# Patient Record
Sex: Male | Born: 1950
Health system: Southern US, Community
[De-identification: ages and names within clinical notes are randomized; demographics above are authoritative.]

## PROBLEM LIST (undated history)

## (undated) DIAGNOSIS — G8929 Other chronic pain: Secondary | ICD-10-CM

## (undated) DIAGNOSIS — F32A Depression, unspecified: Secondary | ICD-10-CM

## (undated) DIAGNOSIS — N62 Hypertrophy of breast: Secondary | ICD-10-CM

## (undated) DIAGNOSIS — Z9981 Dependence on supplemental oxygen: Secondary | ICD-10-CM

## (undated) DIAGNOSIS — M069 Rheumatoid arthritis, unspecified: Secondary | ICD-10-CM

## (undated) DIAGNOSIS — Z9889 Other specified postprocedural states: Secondary | ICD-10-CM

## (undated) DIAGNOSIS — G459 Transient cerebral ischemic attack, unspecified: Secondary | ICD-10-CM

## (undated) DIAGNOSIS — I82409 Acute embolism and thrombosis of unspecified deep veins of unspecified lower extremity: Secondary | ICD-10-CM

## (undated) DIAGNOSIS — K209 Esophagitis, unspecified without bleeding: Secondary | ICD-10-CM

## (undated) DIAGNOSIS — I6529 Occlusion and stenosis of unspecified carotid artery: Secondary | ICD-10-CM

## (undated) DIAGNOSIS — H269 Unspecified cataract: Secondary | ICD-10-CM

## (undated) DIAGNOSIS — T7840XA Allergy, unspecified, initial encounter: Secondary | ICD-10-CM

## (undated) DIAGNOSIS — A5149 Other secondary syphilitic conditions: Secondary | ICD-10-CM

## (undated) DIAGNOSIS — C9110 Chronic lymphocytic leukemia of B-cell type not having achieved remission: Secondary | ICD-10-CM

## (undated) DIAGNOSIS — J189 Pneumonia, unspecified organism: Secondary | ICD-10-CM

## (undated) DIAGNOSIS — F329 Major depressive disorder, single episode, unspecified: Secondary | ICD-10-CM

## (undated) DIAGNOSIS — B20 Human immunodeficiency virus [HIV] disease: Secondary | ICD-10-CM

## (undated) DIAGNOSIS — M549 Dorsalgia, unspecified: Secondary | ICD-10-CM

## (undated) DIAGNOSIS — K219 Gastro-esophageal reflux disease without esophagitis: Secondary | ICD-10-CM

## (undated) DIAGNOSIS — D689 Coagulation defect, unspecified: Secondary | ICD-10-CM

## (undated) DIAGNOSIS — G709 Myoneural disorder, unspecified: Secondary | ICD-10-CM

## (undated) DIAGNOSIS — I639 Cerebral infarction, unspecified: Secondary | ICD-10-CM

## (undated) DIAGNOSIS — M179 Osteoarthritis of knee, unspecified: Secondary | ICD-10-CM

## (undated) DIAGNOSIS — J302 Other seasonal allergic rhinitis: Secondary | ICD-10-CM

## (undated) DIAGNOSIS — Z8719 Personal history of other diseases of the digestive system: Secondary | ICD-10-CM

## (undated) DIAGNOSIS — N529 Male erectile dysfunction, unspecified: Secondary | ICD-10-CM

## (undated) DIAGNOSIS — R112 Nausea with vomiting, unspecified: Secondary | ICD-10-CM

## (undated) DIAGNOSIS — I219 Acute myocardial infarction, unspecified: Secondary | ICD-10-CM

## (undated) DIAGNOSIS — K8020 Calculus of gallbladder without cholecystitis without obstruction: Secondary | ICD-10-CM

## (undated) DIAGNOSIS — I10 Essential (primary) hypertension: Secondary | ICD-10-CM

## (undated) DIAGNOSIS — M1710 Unilateral primary osteoarthritis, unspecified knee: Secondary | ICD-10-CM

## (undated) DIAGNOSIS — K649 Unspecified hemorrhoids: Secondary | ICD-10-CM

## (undated) DIAGNOSIS — M797 Fibromyalgia: Secondary | ICD-10-CM

## (undated) DIAGNOSIS — Z21 Asymptomatic human immunodeficiency virus [HIV] infection status: Secondary | ICD-10-CM

## (undated) DIAGNOSIS — M810 Age-related osteoporosis without current pathological fracture: Secondary | ICD-10-CM

## (undated) DIAGNOSIS — B159 Hepatitis A without hepatic coma: Secondary | ICD-10-CM

## (undated) DIAGNOSIS — I2510 Atherosclerotic heart disease of native coronary artery without angina pectoris: Secondary | ICD-10-CM

## (undated) DIAGNOSIS — K5790 Diverticulosis of intestine, part unspecified, without perforation or abscess without bleeding: Secondary | ICD-10-CM

## (undated) DIAGNOSIS — M109 Gout, unspecified: Secondary | ICD-10-CM

## (undated) DIAGNOSIS — F419 Anxiety disorder, unspecified: Secondary | ICD-10-CM

## (undated) DIAGNOSIS — D126 Benign neoplasm of colon, unspecified: Secondary | ICD-10-CM

## (undated) HISTORY — DX: Acute embolism and thrombosis of unspecified deep veins of unspecified lower extremity: I82.409

## (undated) HISTORY — PX: HIATAL HERNIA REPAIR: SHX195

## (undated) HISTORY — DX: Benign neoplasm of colon, unspecified: D12.6

## (undated) HISTORY — DX: Unspecified cataract: H26.9

## (undated) HISTORY — DX: Other specified postprocedural states: Z98.890

## (undated) HISTORY — DX: Gout, unspecified: M10.9

## (undated) HISTORY — PX: TONSILLECTOMY: SUR1361

## (undated) HISTORY — DX: Male erectile dysfunction, unspecified: N52.9

## (undated) HISTORY — PX: CHOLECYSTECTOMY: SHX55

## (undated) HISTORY — PX: OTHER SURGICAL HISTORY: SHX169

## (undated) HISTORY — DX: Coagulation defect, unspecified: D68.9

## (undated) HISTORY — DX: Allergy, unspecified, initial encounter: T78.40XA

## (undated) HISTORY — DX: Diverticulosis of intestine, part unspecified, without perforation or abscess without bleeding: K57.90

## (undated) HISTORY — DX: Unilateral primary osteoarthritis, unspecified knee: M17.10

## (undated) HISTORY — DX: Personal history of other diseases of the digestive system: Z87.19

## (undated) HISTORY — DX: Pneumonia, unspecified organism: J18.9

## (undated) HISTORY — DX: Hepatitis a without hepatic coma: B15.9

## (undated) HISTORY — DX: Anxiety disorder, unspecified: F41.9

## (undated) HISTORY — DX: Transient cerebral ischemic attack, unspecified: G45.9

## (undated) HISTORY — DX: Esophagitis, unspecified: K20.9

## (undated) HISTORY — PX: ROTATOR CUFF REPAIR: SHX139

## (undated) HISTORY — DX: Esophagitis, unspecified without bleeding: K20.90

## (undated) HISTORY — DX: Atherosclerotic heart disease of native coronary artery without angina pectoris: I25.10

## (undated) HISTORY — DX: Rheumatoid arthritis, unspecified: M06.9

## (undated) HISTORY — DX: Hypertrophy of breast: N62

## (undated) HISTORY — DX: Calculus of gallbladder without cholecystitis without obstruction: K80.20

## (undated) HISTORY — PX: SHOULDER SURGERY: SHX246

## (undated) HISTORY — DX: Age-related osteoporosis without current pathological fracture: M81.0

## (undated) HISTORY — DX: Other secondary syphilitic conditions: A51.49

## (undated) HISTORY — PX: BACK SURGERY: SHX140

## (undated) HISTORY — DX: Unspecified hemorrhoids: K64.9

## (undated) HISTORY — PX: INGUINAL HERNIA REPAIR: SUR1180

## (undated) HISTORY — DX: Asymptomatic human immunodeficiency virus (hiv) infection status: Z21

## (undated) HISTORY — DX: Occlusion and stenosis of unspecified carotid artery: I65.29

## (undated) HISTORY — DX: Human immunodeficiency virus (HIV) disease: B20

## (undated) HISTORY — PX: REPLACEMENT TOTAL KNEE: SUR1224

## (undated) HISTORY — DX: Dorsalgia, unspecified: M54.9

## (undated) HISTORY — DX: Acute myocardial infarction, unspecified: I21.9

## (undated) HISTORY — DX: Chronic lymphocytic leukemia of B-cell type not having achieved remission: C91.10

## (undated) HISTORY — PX: MANDIBLE SURGERY: SHX707

## (undated) HISTORY — DX: Fibromyalgia: M79.7

## (undated) HISTORY — DX: Osteoarthritis of knee, unspecified: M17.9

## (undated) HISTORY — DX: Other chronic pain: G89.29

## (undated) HISTORY — PX: UMBILICAL HERNIA REPAIR: SHX196

---
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
## 1995-05-13 DIAGNOSIS — G459 Transient cerebral ischemic attack, unspecified: Secondary | ICD-10-CM

## 1995-05-13 HISTORY — DX: Transient cerebral ischemic attack, unspecified: G45.9

## 1997-05-12 HISTORY — PX: JOINT REPLACEMENT: SHX530

## 2006-05-12 DIAGNOSIS — Z8719 Personal history of other diseases of the digestive system: Secondary | ICD-10-CM

## 2006-05-12 HISTORY — DX: Personal history of other diseases of the digestive system: Z87.19

## 2008-05-12 DIAGNOSIS — I219 Acute myocardial infarction, unspecified: Secondary | ICD-10-CM

## 2008-05-12 DIAGNOSIS — I2510 Atherosclerotic heart disease of native coronary artery without angina pectoris: Secondary | ICD-10-CM

## 2008-05-12 HISTORY — PX: SPINE SURGERY: SHX786

## 2008-05-12 HISTORY — DX: Atherosclerotic heart disease of native coronary artery without angina pectoris: I25.10

## 2008-05-12 HISTORY — PX: OTHER SURGICAL HISTORY: SHX169

## 2008-05-12 HISTORY — PX: CORONARY ARTERY BYPASS GRAFT: SHX141

## 2008-05-12 HISTORY — DX: Acute myocardial infarction, unspecified: I21.9

## 2008-06-22 DIAGNOSIS — A6000 Herpesviral infection of urogenital system, unspecified: Secondary | ICD-10-CM | POA: Insufficient documentation

## 2008-06-22 DIAGNOSIS — C9110 Chronic lymphocytic leukemia of B-cell type not having achieved remission: Secondary | ICD-10-CM | POA: Insufficient documentation

## 2008-06-26 DIAGNOSIS — E7800 Pure hypercholesterolemia, unspecified: Secondary | ICD-10-CM | POA: Insufficient documentation

## 2008-06-30 DIAGNOSIS — I82409 Acute embolism and thrombosis of unspecified deep veins of unspecified lower extremity: Secondary | ICD-10-CM | POA: Insufficient documentation

## 2008-08-21 DIAGNOSIS — R1031 Right lower quadrant pain: Secondary | ICD-10-CM | POA: Insufficient documentation

## 2008-09-08 DIAGNOSIS — K5732 Diverticulitis of large intestine without perforation or abscess without bleeding: Secondary | ICD-10-CM | POA: Insufficient documentation

## 2008-09-15 ENCOUNTER — Ambulatory Visit: Payer: Self-pay | Admitting: Cardiovascular Disease

## 2008-09-15 ENCOUNTER — Inpatient Hospital Stay (HOSPITAL_COMMUNITY): Admission: EM | Admit: 2008-09-15 | Discharge: 2008-09-18 | Payer: Self-pay | Admitting: Cardiovascular Disease

## 2008-09-26 DIAGNOSIS — I2510 Atherosclerotic heart disease of native coronary artery without angina pectoris: Secondary | ICD-10-CM | POA: Insufficient documentation

## 2008-09-28 ENCOUNTER — Ambulatory Visit: Payer: Self-pay | Admitting: Cardiology

## 2008-09-28 ENCOUNTER — Ambulatory Visit: Payer: Self-pay | Admitting: Cardiothoracic Surgery

## 2008-09-28 ENCOUNTER — Encounter: Payer: Self-pay | Admitting: Cardiology

## 2008-09-28 ENCOUNTER — Inpatient Hospital Stay (HOSPITAL_COMMUNITY): Admission: EM | Admit: 2008-09-28 | Discharge: 2008-10-09 | Payer: Self-pay | Admitting: Cardiology

## 2008-10-03 ENCOUNTER — Encounter: Payer: Self-pay | Admitting: Cardiology

## 2008-10-03 ENCOUNTER — Encounter: Payer: Self-pay | Admitting: Cardiothoracic Surgery

## 2008-10-05 ENCOUNTER — Encounter: Payer: Self-pay | Admitting: Cardiology

## 2008-10-16 ENCOUNTER — Ambulatory Visit: Payer: Self-pay | Admitting: Cardiothoracic Surgery

## 2008-10-27 ENCOUNTER — Encounter: Admission: RE | Admit: 2008-10-27 | Discharge: 2008-10-27 | Payer: Self-pay | Admitting: Cardiothoracic Surgery

## 2008-10-27 ENCOUNTER — Ambulatory Visit: Payer: Self-pay | Admitting: Cardiothoracic Surgery

## 2008-10-27 ENCOUNTER — Encounter: Payer: Self-pay | Admitting: Cardiology

## 2008-11-08 ENCOUNTER — Ambulatory Visit: Payer: Self-pay | Admitting: Cardiology

## 2008-11-10 ENCOUNTER — Ambulatory Visit: Payer: Self-pay | Admitting: Cardiothoracic Surgery

## 2008-12-26 ENCOUNTER — Telehealth: Payer: Self-pay | Admitting: Physician Assistant

## 2008-12-26 ENCOUNTER — Encounter: Payer: Self-pay | Admitting: Cardiology

## 2008-12-27 ENCOUNTER — Encounter: Payer: Self-pay | Admitting: Cardiology

## 2009-01-17 ENCOUNTER — Encounter: Payer: Self-pay | Admitting: Cardiology

## 2009-01-29 ENCOUNTER — Telehealth (INDEPENDENT_AMBULATORY_CARE_PROVIDER_SITE_OTHER): Payer: Self-pay | Admitting: *Deleted

## 2009-01-30 DIAGNOSIS — R1310 Dysphagia, unspecified: Secondary | ICD-10-CM | POA: Insufficient documentation

## 2009-01-30 DIAGNOSIS — R42 Dizziness and giddiness: Secondary | ICD-10-CM | POA: Insufficient documentation

## 2009-02-11 ENCOUNTER — Encounter: Payer: Self-pay | Admitting: Cardiology

## 2009-02-12 ENCOUNTER — Encounter: Payer: Self-pay | Admitting: Cardiology

## 2009-02-14 DIAGNOSIS — I25810 Atherosclerosis of coronary artery bypass graft(s) without angina pectoris: Secondary | ICD-10-CM | POA: Insufficient documentation

## 2009-02-20 ENCOUNTER — Ambulatory Visit: Payer: Self-pay | Admitting: Cardiology

## 2009-02-22 ENCOUNTER — Encounter: Payer: Self-pay | Admitting: Cardiology

## 2009-03-06 ENCOUNTER — Encounter: Payer: Self-pay | Admitting: Cardiology

## 2009-03-20 ENCOUNTER — Encounter: Payer: Self-pay | Admitting: Cardiology

## 2009-05-01 ENCOUNTER — Telehealth (INDEPENDENT_AMBULATORY_CARE_PROVIDER_SITE_OTHER): Payer: Self-pay | Admitting: *Deleted

## 2009-05-03 ENCOUNTER — Encounter: Payer: Self-pay | Admitting: Cardiology

## 2009-05-09 ENCOUNTER — Encounter: Payer: Self-pay | Admitting: Cardiology

## 2009-05-17 ENCOUNTER — Encounter: Payer: Self-pay | Admitting: Cardiology

## 2009-05-17 ENCOUNTER — Ambulatory Visit: Payer: Self-pay | Admitting: Cardiology

## 2009-05-17 ENCOUNTER — Encounter: Payer: Self-pay | Admitting: Physician Assistant

## 2009-05-18 ENCOUNTER — Encounter: Payer: Self-pay | Admitting: Cardiology

## 2009-05-19 ENCOUNTER — Encounter: Payer: Self-pay | Admitting: Cardiology

## 2009-05-21 ENCOUNTER — Telehealth (INDEPENDENT_AMBULATORY_CARE_PROVIDER_SITE_OTHER): Payer: Self-pay | Admitting: *Deleted

## 2009-05-24 ENCOUNTER — Telehealth (INDEPENDENT_AMBULATORY_CARE_PROVIDER_SITE_OTHER): Payer: Self-pay | Admitting: *Deleted

## 2009-06-04 ENCOUNTER — Encounter: Payer: Self-pay | Admitting: Cardiology

## 2009-06-09 ENCOUNTER — Encounter: Payer: Self-pay | Admitting: Cardiology

## 2009-06-20 ENCOUNTER — Encounter: Payer: Self-pay | Admitting: Physician Assistant

## 2009-06-22 ENCOUNTER — Encounter (INDEPENDENT_AMBULATORY_CARE_PROVIDER_SITE_OTHER): Payer: Self-pay | Admitting: *Deleted

## 2009-07-04 ENCOUNTER — Encounter: Payer: Self-pay | Admitting: Cardiology

## 2009-07-12 ENCOUNTER — Ambulatory Visit: Payer: Self-pay | Admitting: Cardiology

## 2009-08-29 ENCOUNTER — Encounter: Payer: Self-pay | Admitting: Cardiology

## 2009-11-27 ENCOUNTER — Encounter: Payer: Self-pay | Admitting: Cardiology

## 2009-11-27 ENCOUNTER — Encounter: Payer: Self-pay | Admitting: Internal Medicine

## 2010-01-08 ENCOUNTER — Telehealth (INDEPENDENT_AMBULATORY_CARE_PROVIDER_SITE_OTHER): Payer: Self-pay | Admitting: *Deleted

## 2010-01-15 ENCOUNTER — Telehealth (INDEPENDENT_AMBULATORY_CARE_PROVIDER_SITE_OTHER): Payer: Self-pay | Admitting: *Deleted

## 2010-01-28 ENCOUNTER — Encounter: Payer: Self-pay | Admitting: Cardiology

## 2010-01-29 ENCOUNTER — Ambulatory Visit: Payer: Self-pay | Admitting: Cardiology

## 2010-04-09 ENCOUNTER — Ambulatory Visit: Payer: Self-pay | Admitting: Internal Medicine

## 2010-04-29 ENCOUNTER — Encounter (HOSPITAL_COMMUNITY)
Admission: RE | Admit: 2010-04-29 | Discharge: 2010-05-29 | Payer: Self-pay | Source: Home / Self Care | Attending: Internal Medicine | Admitting: Internal Medicine

## 2010-05-20 ENCOUNTER — Ambulatory Visit (HOSPITAL_COMMUNITY)
Admission: RE | Admit: 2010-05-20 | Discharge: 2010-05-20 | Payer: Self-pay | Source: Home / Self Care | Attending: Internal Medicine | Admitting: Internal Medicine

## 2010-06-11 NOTE — Procedures (Signed)
Summary: Cardionet-Final Summary  Cardionet-Final Summary   Imported By: Cyril Loosen, RN, BSN 06/22/2009 72:53:66  _____________________________________________________________________  External Attachment:    Type:   Image     Comment:   External Document  Appended Document: Cardionet-Final Summary Pt notified of results by letter.

## 2010-06-11 NOTE — Consult Note (Signed)
Summary: [facility]OLOGY CONSULT [facility]  CARDIOLOGY CONSULT [facility]   Imported By: Zachary George 02/20/2009 12:48:27  _____________________________________________________________________  External Attachment:    Type:   Image     Comment:   External Document

## 2010-06-11 NOTE — Letter (Signed)
Summary: Engineer, materials at [facility]  [address]   [place], [place] [address]   Phone: [phone]  Fax: [phone]        June 22, 2009 MRN: [id number]    [name] [address] [place], [place]  [address]    Dear Mr. [name],  Your test ordered by Selena Batten has been reviewed by your physician (or physician assistant) and was found to be normal or stable. Your physician (or physician assistant) felt no changes were needed at this time.  ____ Echocardiogram  ____ Cardiac Stress Test  ____ Lab Work  ____ Peripheral vascular study of arms, legs or neck  ____ CT scan or X-ray  ____ Lung or Breathing test  __X__ Other: Heart Monitor   Thank you.   Cyril Loosen, RN, BSN    Duane Boston, M.D., F.A.C.C. Thressa Sheller, M.D., F.A.C.C. Oneal Grout, M.D., F.A.C.C. Cheree Ditto, M.D., F.A.C.C. Daiva Nakayama, M.D., F.A.C.C. Kenney Houseman, M.D., F.A.C.C. Jeanne Ivan, PA-C

## 2010-06-11 NOTE — Progress Notes (Signed)
Summary: ?MISSED APPT  Phone Note Outgoing Call Call back at [facility] Phone [phone]   Call placed by: Carlye Grippe,  January 15, 2010 2:16 PM Call placed to: Patient Summary of Call: left message on machine to call office in r/e to missed appointment today.  Initial call taken by: Carlye Grippe,  January 15, 2010 2:17 PM

## 2010-06-11 NOTE — Miscellaneous (Signed)
Summary: RX Crestor  Clinical Lists Changes  Medications: Rx of CRESTOR 10 MG TABS (ROSUVASTATIN CALCIUM) Take 1 tab by mouth at bedtime;  #30 x 1;  Signed;  Entered by: Cyril Loosen, RN, BSN;  Authorized by: Rollene Rotunda, MD, [facility];  Method used: Electronically to [facility] # [phone]*, [address] [place], [place]  [address], Ph: [phone] or [phone], Fax: [phone]    Prescriptions: CRESTOR 10 MG TABS (ROSUVASTATIN CALCIUM) Take 1 tab by mouth at bedtime  #30 x 1   Entered by:   Cyril Loosen, RN, BSN   Authorized by:   Rollene Rotunda, MD, [facility]   Signed by:   Cyril Loosen, RN, BSN on 12/26/2008   Method used:   Electronically to        [facility] # [phone]* (retail)       [address]       [place], [place]  [address]       Ph: [phone] or [phone]       Fax: [phone]   RxID:   [id number]

## 2010-06-11 NOTE — Letter (Signed)
Summary: [facility] DR.Tadd Holtmeyer [facility]  [facility] DR[facility]Barnett Elzey [facility]   Imported By: Zachary George 02/20/2009 12:37:35  _____________________________________________________________________  External Attachment:    Type:   Image     Comment:   External Document

## 2010-06-11 NOTE — Op Note (Signed)
Summary: ESOPHAGOGASTRODUODENOSCOPY   ESOPHAGOGASTRODUODENOSCOPY   Imported By: Zachary George 02/20/2009 12:38:06  _____________________________________________________________________  External Attachment:    Type:   Image     Comment:   External Document

## 2010-06-11 NOTE — Letter (Signed)
Summary: External Correspondence/ NEUROSURGERY VISIT DR. BAGLEY  External Correspondence/ NEUROSURGERY VISIT DR. BAGLEY   Imported By: Dorise Hiss 09/10/2009 08:51:06  _____________________________________________________________________  External Attachment:    Type:   Image     Comment:   External Document  Appended Document: External Correspondence/ NEUROSURGERY VISIT DR. BAGLEY Patient of Dr. Antoine Poche.

## 2010-06-11 NOTE — Progress Notes (Signed)
Summary: TCT OFFICE VISIT  TCT OFFICE VISIT   Imported By: Zachary George 02/20/2009 12:52:09  _____________________________________________________________________  External Attachment:    Type:   Image     Comment:   External Document

## 2010-06-11 NOTE — Progress Notes (Signed)
Summary: Med Question-  Phone Note Call from Patient Call back at [facility] [address] Phone [phone]   Summary of Call: Pt called stating asking if he could use Cialis. He states he no longer take Isosorbide. He states this was only for 1 month following surgery.  Initial call taken by: Cyril Loosen, RN, BSN,  December 26, 2008 8:35 AM  Follow-up for Phone Call        OK to proceed, but no nitrates (IMDUR, as needed NTG) in 24 hour period. Follow-up by: Nelida Meuse, PA-C,  December 26, 2008 12:17 PM  Additional Follow-up for Phone Call Additional follow up Details #1::        Left message to call back  Cyril Loosen, RN, BSN  December 26, 2008 3:17 PM Pt notified. Pt verbalized understanding.   Additional Follow-up by: Cyril Loosen, RN, BSN,  December 26, 2008 4:17 PM

## 2010-06-11 NOTE — Letter (Signed)
Summary: External Correspondence/ NEUROSURGERY VISIT [facility]  External Correspondence/ NEUROSURGERY VISIT [facility]   Imported By: Dorise Hiss 05/21/2009 11:30:50  _____________________________________________________________________  External Attachment:    Type:   Image     Comment:   External Document

## 2010-06-11 NOTE — Assessment & Plan Note (Signed)
Summary: 3 MONTH FU RECV REMINDER VS   Visit Type:  Follow-up Primary Provider:  Sell,Jarrett  CC:  CAD/CABG.  History of Present Illness: The patient presents for followup of his known coronary disease. Since I last saw him he has had no cardiovascular complaints. He was hospitalized with a spontaneous left hip hematoma. He has required an EGD with esophageal dilatation. All of this interrupted his cardiac rehabilitation. However, he is now back in cardiac rehabilitation exercising 3 times per week. With this he has had none of the chest discomfort that was his previous symptoms. He has had no arm or neck discomfort. He has had no shortness of breath and denies any resting symptoms such as PND or orthopnea. He has had no palpitations, presyncope or syncope. He is bothered by lumbar back pain and is being evaluated for surgical repair of this.    Of note his Crestor was discontinued secondary to hyperbilirubinemia. However, this is felt to be related to his HIV drugs. His counts remained elevated off of the Crestor.  Preventive Screening-Counseling & Management  Alcohol-Tobacco     Smoking Status: never  Current Medications (verified): 1)  Metformin Hcl 500 Mg Tabs (Metformin Hcl) .... Take 1 Tablet By Mouth Once A Day 2)  Hydrocortisone 20 Mg Tabs (Hydrocortisone) .... One Tab Every Morning and 1/2 Tab Every Evening 3)  Combivir 150-300 Mg Tabs (Lamivudine-Zidovudine) .... Take 1 Tablet By Mouth Two Times A Day 4)  Lyrica 100 Mg Caps (Pregabalin) .... 2 Tabs Every Morning and 1 Tab At At Bedtime 5)  Reyataz 150 Mg Caps (Atazanavir Sulfate) .... Take 1 Tablet By Mouth Two Times A Day 6)  Norvir 100 Mg Tabs (Ritonavir) .... Take 1 Tablet By Mouth Once A Day 7)  Warfarin Sodium 10 Mg Tabs (Warfarin Sodium) .... Take As Directed Per Coumadin Clinic With Dr. Jake Samples 8)  Aspirin 81 Mg Tbec (Aspirin) .... Take One Tablet By Mouth Daily  9)  Valtrex 500 Mg Tabs (Valacyclovir Hcl) .... Take 1 Tablet By Mouth Once A Day 10)  Vitamin D 1000 Unit Tabs (Cholecalciferol) .... 2 Tabs Every Morning 11)  Multivitamins   Tabs (Multiple Vitamin) .... Take 1 Tablet By Mouth Once A Day 12)  Calcium Carbonate 600 Mg Tabs (Calcium Carbonate) .... Take 1 Tablet By Mouth Once A Day 13)  Cialis 20 Mg Tabs (Tadalafil) .... As Needed  Allergies (verified): 1)  ! Pcn 2)  ! Phenergan 3)  ! Morphine  Past History:  Past Medical History: HYPERLIPIDEMIA-MIXED (ICD-272.4) CAD, NATIVE VESSEL (ICD-414.01) (Oct 02, 2008.  This demonstrated left main 50-60% stenosis.  The LAD had 80% stenosis beyond the takeoff of the first diagonal branch.  The proximal stent was patent.  The circumflex had 80- 90% ostial stenosis.  Ramus intermediate had 60-70% stenosis.  It was a small to moderate size vessel.  Small marginal had 80% stenosis.  The right coronary artery had an ulcerated 80% stenosis in the mid segment.) The EF was 40-45%.   DVT  Diabetes.  Past Surgical History: Cholecystectomy CABG (Dr. [name] with a LIMA to the LAD, left radial graft to the circumflex and marginal, and right radial graft to the distal right coronary artery. )  Review of Systems       As stated in the HPI and negative for all other systems.   Vital Signs:  Patient profile:   60 year old male Height:      70 inches Weight:  179.50 pounds BMI:     25.85 Pulse rate:   80 / minute BP sitting:   87 / 43  (left arm) Cuff size:   regular  Vitals Entered By: Hoover Brunette, LPN (February 20, 2009 1:35 PM)  Nutrition Counseling: Patient's BMI is greater than 25 and therefore counseled on weight management options. CC: CAD/CABG Is Patient Diabetic? Yes   Physical Exam  General:  Well developed, well nourished, in no acute distress. Head:  normocephalic and atraumatic Eyes:  PERRLA/EOM intact; conjunctiva and lids normal.  Mouth:  Teeth, gums and palate normal. Oral mucosa normal. Neck:  Neck supple, no JVD. No masses, thyromegaly or abnormal cervical nodes. Chest Wall:  well healed sternotomy scar Lungs:  Clear bilaterally to auscultation and percussion. Heart:  Non-displaced PMI, chest non-tender; regular rate and rhythm, S1, S2 without murmurs, rubs or gallops. Carotid upstroke normal, no bruit. Normal abdominal aortic size, no bruits. Femorals normal pulses, no bruits. Pedals normal pulses. No edema, no varicosities. Abdomen:  Bowel sounds positive; abdomen soft and non-tender without masses, organomegaly, or hernias noted. No hepatosplenomegaly. Msk:  Back normal, normal gait. Muscle strength and tone normal. Extremities:  No clubbing or cyanosis. Neurologic:  Alert and oriented x 3. Skin:  Intact without lesions or rashes. Cervical Nodes:  no significant adenopathy Axillary Nodes:  no significant adenopathy Inguinal Nodes:  no significant adenopathy Psych:  Normal affect.   Impression & Recommendations:  Problem # 1:  CAD, NATIVE VESSEL (ICD-414.01) The patient is status post bypass. He has no new symptoms. He is dissipating and risk reduction. At this point no further cardiovascular testing is suggested. He will continue with the meds as listed.  Of note I will take him off of his beta blocker. It is a low dose but he is particularly hypotensive.  Problem # 2:  PREOPERATIVE EXAMINATION (ICD-V72.84) The patient would be at acceptable risk for the planned back surgery according to ACC/AHA guidelines. No further cardiovascular testing is suggested. He will remain on the meds as listed with the exception described above.  Problem # 3:  HYPERLIPIDEMIA-MIXED (ICD-272.4) He will resume his Crestor at 10 mg daily. He will be given instructions to get a lipid and liver profile in 8 weeks. Orders: T-Lipid Profile [phone]) T-Hepatic Function [phone])  Problem # 4:  DVT (ICD-453.40)  His long-term Coumadin is managed by his primary physician.  Patient Instructions: 1)  Your physician wants you to follow-up in: . You will receive a reminder letter in the mail about two months in advance. If you don't receive a letter, please call our office to schedule the follow-up appointment. 2)  Your physician recommends that you return for a FASTING lipid,liver  profile: around Dec 12th 2010 at the [facility].  3)  Your physician has recommended you make the following change in your medication: stop metoprolol and start crestor 10mg  Prescriptions: CRESTOR 10 MG TABS (ROSUVASTATIN CALCIUM) Take 1 tablet by mouth once a day at bedtime  #30 x 6   Entered by:   Carlye Grippe   Authorized by:   Rollene Rotunda, MD, [facility]   Signed by:   Rollene Rotunda, MD, [facility] on 02/20/2009   Method used:   Electronically to        [facility] # [phone]* (retail)       [address]       [place], [place]  [address]       Ph: [phone] or  [phone]       Fax: [phone]   RxID:   [id number]

## 2010-06-11 NOTE — Letter (Signed)
Summary: External Correspondence/ FAXED [name]  External Correspondence/ FAXED [name]   Imported By: Dorise Hiss 02/22/2009 15:31:59  _____________________________________________________________________  External Attachment:    Type:   Image     Comment:   External Document

## 2010-06-11 NOTE — Letter (Signed)
Summary: [facility] D/C DR. Wende Crease  [facility] D/C DR. Wende Crease   Imported By: Zachary George 07/11/2009 12:01:04  _____________________________________________________________________  External Attachment:    Type:   Image     Comment:   External Document

## 2010-06-11 NOTE — Miscellaneous (Signed)
Summary: rx - crestor  Clinical Lists Changes  Medications: Added new medication of CRESTOR 10 MG TABS (ROSUVASTATIN CALCIUM) Take 1 tab by mouth at bedtime - Signed Rx of CRESTOR 10 MG TABS (ROSUVASTATIN CALCIUM) Take 1 tab by mouth at bedtime;  #90 x 3;  Signed;  Entered by: Hoover Brunette, LPN;  Authorized by: Rollene Rotunda, MD, [facility];  Method used: Print then Give to Patient    Prescriptions: CRESTOR 10 MG TABS (ROSUVASTATIN CALCIUM) Take 1 tab by mouth at bedtime  #90 x 3   Entered by:   Hoover Brunette, LPN   Authorized by:   Rollene Rotunda, MD, [facility]   Signed by:   Hoover Brunette, LPN on 23/55/7322   Method used:   Print then Give to Patient   RxID:   [id number]

## 2010-06-11 NOTE — Miscellaneous (Signed)
Summary: Rehab Report/CARDIAC PROGRESS REPORT  Rehab Report/CARDIAC PROGRESS REPORT   Imported By: Dorise Hiss 01/19/2009 15:36:16  _____________________________________________________________________  External Attachment:    Type:   Image     Comment:   External Document

## 2010-06-11 NOTE — Progress Notes (Signed)
Summary: PLEASE CALL  Phone Note Call from Patient Call back at Home Phone [phone]   Caller: Patient Call For: nurse Summary of Call: message left on machine that he hasn't received a call from cardionet yet,need to reschedule appt at this clinic, also patient said his BP is running around 89/50 and patient wanted to know if he should reduce his metoprolol 50mg  dose to 25mg  daily. Nurse left message on patient's voicemail asking him to call us to let us know if he was having any symptoms with the BP. Nurse also informed him via voicemail that cardionet was ordered today and they should be calling him in the next couple of days, Appointment will be changed per patient request,saying he couldn't make the February 23rd appt.  Initial call taken by: Carlye Grippe,  May 24, 2009 11:47 AM  Follow-up for Phone Call        c/o lightheadedness with low bp. Please advise.  Follow-up by: Carlye Grippe,  May 24, 2009 2:31 PM  Additional Follow-up for Phone Call Additional follow up Details #1::        agree with pt to decrease metoprolol dose by half, to 25 mg daily Additional Follow-up by: Nelida Meuse, PA-C,  May 24, 2009 4:51 PM    Additional Follow-up for Phone Call Additional follow up Details #2::    Patient informed of the above.  Follow-up by: Carlye Grippe,  May 29, 2009 11:08 AM  New/Updated Medications: METOPROLOL TARTRATE 50 MG TABS (METOPROLOL TARTRATE) Take 1/2 tablet by mouth once a day

## 2010-06-11 NOTE — Consult Note (Signed)
Summary: [facility]  DR. Wende Crease  [facility]  DR. Wende Crease   Imported By: Zachary George 02/20/2009 12:37:10  _____________________________________________________________________  External Attachment:    Type:   Image     Comment:   External Document

## 2010-06-11 NOTE — Assessment & Plan Note (Signed)
Summary: 6 MO FU REMINDER   Visit Type:  Follow-up Primary Provider:  Dr. Kirstie Mirza  CC:  CAD/CABG.  History of Present Illness: The patient returns for six month follow up.  Since saw him he has had no new cardiovascular complaints. He has started to have resolution of some chronic hip pain and has been able to ambulate more. With this he denies any chest pressure, neck or arm discomfort. He is having no new shortness of breath, PND or orthopnea. He has no palpitations, presyncope or syncope. He has had no weight gain or edema.  Preventive Screening-Counseling & Management  Alcohol-Tobacco     Smoking Status: never  Current Medications (verified): 1)  Metformin Hcl 500 Mg Tabs (Metformin Hcl) .... Take 1 Tablet By Mouth Once A Day 2)  Hydrocortisone 20 Mg Tabs (Hydrocortisone) .... One Tab Every Morning and 1/2 Tab Every Evening 3)  Combivir 150-300 Mg Tabs (Lamivudine-Zidovudine) .... Take 1 Tablet By Mouth Two Times A Day 4)  Lyrica 100 Mg Caps (Pregabalin) .... 2 Tabs Every Morning and 1 Tab At At Bedtime 5)  Reyataz 150 Mg Caps (Atazanavir Sulfate) .... Take 1 Tablet By Mouth Two Times A Day 6)  Norvir 100 Mg Tabs (Ritonavir) .... Take 1 Tablet By Mouth Once A Day 7)  Warfarin Sodium 10 Mg Tabs (Warfarin Sodium) .... Take As Directed Per Coumadin Clinic With Dr. Jake Samples 8)  Aspirin 81 Mg Tbec (Aspirin) .... Take One Tablet By Mouth Daily 9)  Valtrex 500 Mg Tabs (Valacyclovir Hcl) .... Take 1 Tablet By Mouth Once A Day 10)  Vitamin D 1000 Unit Tabs (Cholecalciferol) .... 2 Tabs Every Morning 11)  Calcium Carbonate 600 Mg Tabs (Calcium Carbonate) .... Take 1 Tablet By Mouth Once A Day 12)  Cialis 20 Mg Tabs (Tadalafil) .... As Needed 13)  Crestor 10 Mg Tabs (Rosuvastatin Calcium) .... Take 1 Tablet By Mouth Once A Day At Bedtime 14)  Metoprolol Tartrate 25 Mg Tabs (Metoprolol Tartrate) .... Take 1 Tablet By Mouth Once A Day 15)  Zoloft 50 Mg Tabs (Sertraline Hcl) .... Take 1&1/2  Tablet By Mouth Once A Day 16)  Colcrys 0.6 Mg Tabs (Colchicine) .... Take 1 Tablet By Mouth Two Times A Day  Allergies: 1)  ! Pcn 2)  ! Phenergan 3)  ! Morphine 4)  ! Percocet  Comments:  Nurse/Medical Assistant: The patient's medication list and allergies were reviewed with the patient and were updated in the Medication and Allergy Lists.  Past History:  Past Medical History: Reviewed history from 07/12/2009 and no changes required. HYPERLIPIDEMIA-MIXED (ICD-272.4) CAD, NATIVE VESSEL (ICD-414.01) (Oct 02, 2008.  This demonstrated left main 50-60% stenosis.  The LAD had 80% stenosis beyond the takeoff of the first diagonal branch.  The proximal stent was patent.  The circumflex had 80- 90% ostial stenosis.  Ramus intermediate had 60-70% stenosis.  It was a small to moderate size vessel.  Small marginal had 80% stenosis.  The right coronary artery had an ulcerated 80% stenosis in the mid segment.) The EF was 40-45%.   DVT  Diabetes. HIV Addisons  Past Surgical History: Reviewed history from 07/12/2009 and no changes required. Cholecystectomy CABG (Dr. [name] with a LIMA to the LAD, left radial graft to the circumflex and marginal, and right radial graft to the distal right coronary artery. ) Lumbar spine surgery (rods, fusion 03/2008 [facility]) Vein surgery Shoulder surgeries (bilateral) Jaw surgery Appendectomy Bilateral inguinal hernia Umbilical hernia repair Left knee replacement [employer]  fundoplication Appendectomy  Review of Systems       As stated in the HPI and negative for all other systems.   Vital Signs:  Patient profile:   60 year old male Height:      70 inches Weight:      164 pounds BMI:     23.62 Pulse rate:   71 / minute BP sitting:   114 / 72  (left arm) Cuff size:   regular  Vitals Entered By: Carlye Grippe (January 29, 2010 10:14 AM)  Physical Exam  General:  Well developed, well nourished, in no acute distress. Head:   normocephalic and atraumatic Neck:  Neck supple, no JVD. No masses, thyromegaly or abnormal cervical nodes. Chest Wall:  well healed sternotomy scar Lungs:  Clear bilaterally to auscultation and percussion. Heart:  Non-displaced PMI, chest non-tender; regular rate and rhythm, S1, S2 without murmurs, rubs or gallops. Carotid upstroke normal, no bruit. Normal abdominal aortic size, no bruits. Absent radial pulses. Femorals normal pulses, no bruits. Pedals normal pulses. Mild bilateral ankle edema, no varicosities. Abdomen:  Bowel sounds positive; abdomen soft and non-tender without masses, organomegaly, or hernias noted. No hepatosplenomegaly. Msk:  Back normal, normal gait. Muscle strength and tone normal. Extremities:  No clubbing or cyanosis. Neurologic:  Alert and oriented x 3. Skin:  Intact without lesions or rashes. Cervical Nodes:  no significant adenopathy Psych:  Normal affect.    Impression & Recommendations:  Problem # 1:  CAD, NATIVE VESSEL (ICD-414.01) The patient is doing well. No change in therapy is indicated. He will continue with risk reduction.  Problem # 2:  HYPERLIPIDEMIA-MIXED (ICD-272.4) I reviewed lipids done yesterday. His total cholesterol was 129, HDL 44 and LDL 71. This is an excellent ratio he will continue with the meds as listed.  Problem # 3:  DVT (ICD-453.40) He continues on Coumadin and has no problems with this.  Patient Instructions: 1)  Your physician wants you to follow-up in: 1 year. You will receive a reminder letter in the mail one-two months in advance. If you don't receive a letter, please call our office to schedule the follow-up appointment. 2)  Your physician recommends that you continue on your current medications as directed. Please refer to the Current Medication list given to you today. Prescriptions: METOPROLOL TARTRATE 25 MG TABS (METOPROLOL TARTRATE) Take 1 tablet by mouth once a day  #90 x 3   Entered by:   Cyril Loosen, RN, BSN    Authorized by:   Rollene Rotunda, MD, [facility]   Signed by:   Cyril Loosen, RN, BSN on 01/29/2010   Method used:   Electronically to        [facility] # [phone]* (retail)       [address]       [place], [place]  [address]       Ph: [phone] or [phone]       Fax: [phone]   RxID:   [id number] CRESTOR 10 MG TABS (ROSUVASTATIN CALCIUM) Take 1 tablet by mouth once a day at bedtime  #90 x 3   Entered by:   Cyril Loosen, RN, BSN   Authorized by:   Rollene Rotunda, MD, [facility]   Signed by:   Cyril Loosen, RN, BSN on 01/29/2010   Method used:   Electronically to        [facility] # [phone]* (retail)       [address]       [place], [place]  [address]       Ph: [phone] or [phone]       Fax: [phone]   RxID:   [id number]

## 2010-06-11 NOTE — Progress Notes (Signed)
Summary: WANT EARLIER APPT FOR CHEST PAIN  Phone Note Call from Patient   Caller: Patient Call For: nurse Summary of Call: patient walked into office saying he had to go back to ED on Saturday for chest pain and they informed him to contact our office on Monday to get an appt. Patient offered 07/04/09 @09 :00am. Patient wanted to make sure MD was aware of his recent visit to ED. MD made aware and said the appt and cardionet monitor was okay. Patient informed.  Nurse inquired about PCP. Patient informed nurse that Isac Sarna was his PCP and that his ID MD was aware of his elevated bilirubin that is coming from his HIV drugs. MD informed.  Initial call taken by: Carlye Grippe,  May 21, 2009 9:36 AM

## 2010-06-11 NOTE — Assessment & Plan Note (Signed)
Summary: [facility]   Visit Type:  Follow-up Primary Provider:  Dr. Kirstie Mirza  CC:  Chest Pain and CAD.  History of Present Illness: The patient presents for evaluation of his known coronary disease. He has had an eventful history since I last saw him. He has had back surgery at [facility]. He was hospitalized in January for chest discomfort and had a stress perfusion study demonstrating an EF of 45% with apical infarct. An echo confirmed an EF of 45-50%. I reviewed these records and reviewed them with the patient today. He subsequently came back to the emergency room with chest discomfort. He was sent home with an event monitor which was negative for any evidence of significant arrhythmias. Finally he has been bothered by persistent right hip pain with hemarthrosis and an apparent mass that is being evaluated. He has remained on Coumadin however.  Despite all this, from a cardiovascular standpoint he thinks he is doing relatively well. He is able to vacuum and do household chores. He is limited by some hip pain and generalized fatigue. He denies any chest pressure, neck or arm discomfort. He has had no palpitations, presyncope or syncope. He has had no PND or orthopnea.  Preventive Screening-Counseling & Management  Alcohol-Tobacco     Smoking Status: never  Current Medications (verified): 1)  Metformin Hcl 500 Mg Tabs (Metformin Hcl) .... Take 1 Tablet By Mouth Once A Day 2)  Hydrocortisone 20 Mg Tabs (Hydrocortisone) .... One Tab Every Morning and 1/2 Tab Every Evening 3)  Combivir 150-300 Mg Tabs (Lamivudine-Zidovudine) .... Take 1 Tablet By Mouth Two Times A Day 4)  Lyrica 100 Mg Caps (Pregabalin) .... 2 Tabs Every Morning and 1 Tab At At Bedtime 5)  Reyataz 150 Mg Caps (Atazanavir Sulfate) .... Take 1 Tablet By Mouth Two Times A Day 6)  Norvir 100 Mg Tabs (Ritonavir) .... Take 1 Tablet By Mouth Once A Day 7)  Warfarin Sodium 10 Mg Tabs (Warfarin Sodium) .... Take As  Directed Per Coumadin Clinic With Dr. Jake Samples 8)  Aspirin 81 Mg Tbec (Aspirin) .... Take One Tablet By Mouth Daily 9)  Valtrex 500 Mg Tabs (Valacyclovir Hcl) .... Take 1 Tablet By Mouth Once A Day 10)  Vitamin D 1000 Unit Tabs (Cholecalciferol) .... 2 Tabs Every Morning 11)  Multivitamins   Tabs (Multiple Vitamin) .... Take 1 Tablet By Mouth Once A Day 12)  Calcium Carbonate 600 Mg Tabs (Calcium Carbonate) .... Take 1 Tablet By Mouth Once A Day 13)  Cialis 20 Mg Tabs (Tadalafil) .... As Needed 14)  Crestor 10 Mg Tabs (Rosuvastatin Calcium) .... Take 1 Tablet By Mouth Once A Day At Bedtime 15)  Metoprolol Tartrate 25 Mg Tabs (Metoprolol Tartrate) .... Take 1 Tablet By Mouth Once A Day  Allergies: 1)  ! Pcn 2)  ! Phenergan 3)  ! Morphine  Comments:  Nurse/Medical Assistant: The patient's medications were reviewed with the patient and were updated in the Medication List. Pt brought a list of medications to office visit.  Cyril Loosen, RN, BSN (July 12, 2009 9:47 AM)  Past History:  Past Medical History: HYPERLIPIDEMIA-MIXED (ICD-272.4) CAD, NATIVE VESSEL (ICD-414.01) (Oct 02, 2008.  This demonstrated left main 50-60% stenosis.  The LAD had 80% stenosis beyond the takeoff of the first diagonal branch.  The proximal stent was patent.  The circumflex had 80- 90% ostial stenosis.  Ramus intermediate had 60-70% stenosis.  It was a small to moderate size vessel.  Small marginal  had 80% stenosis.  The right coronary artery had an ulcerated 80% stenosis in the mid segment.) The EF was 40-45%.   DVT  Diabetes. HIV Addisons  Past Surgical History: Cholecystectomy CABG (Dr. [name] with a LIMA to the LAD, left radial graft to the circumflex and marginal, and right radial graft to the distal right coronary artery. ) Lumbar spine surgery (rods, fusion 03/2008 [facility]) Vein surgery Shoulder surgeries (bilateral) Jaw surgery Appendectomy Bilateral inguinal hernia Umbilical hernia  repair Left knee replacement Nissan fundoplication Appendectomy  Review of Systems       As stated in the HPI and negative for all other systems.   Vital Signs:  Patient profile:   60 year old male Height:      70 inches Weight:      166.75 pounds Pulse rate:   75 / minute BP sitting:   101 / 61  (left arm) Cuff size:   regular  Vitals Entered By: Cyril Loosen, RN, BSN (July 12, 2009 9:41 AM) CC: Chest Pain, CAD Comments follow up visit. Pt states he's doing well.   Physical Exam  General:  Well developed, well nourished, in no acute distress. Head:  normocephalic and atraumatic Eyes:  PERRLA/EOM intact; conjunctiva and lids normal. Mouth:  Teeth, gums and palate normal. Oral mucosa normal. Neck:  Neck supple, no JVD. No masses, thyromegaly or abnormal cervical nodes. Chest Wall:  well healed sternotomy scar Lungs:  Clear bilaterally to auscultation and percussion. Heart:  Non-displaced PMI, chest non-tender; regular rate and rhythm, S1, S2 without murmurs, rubs or gallops. Carotid upstroke normal, no bruit. Normal abdominal aortic size, no bruits. Absent radial pulses. Femorals normal pulses, no bruits. Pedals normal pulses. Mild bilateral ankle edema, no varicosities. Abdomen:  Bowel sounds positive; abdomen soft and non-tender without masses, organomegaly, or hernias noted. No hepatosplenomegaly. Msk:  Back normal, normal gait. Muscle strength and tone normal. Extremities:  No clubbing or cyanosis. Neurologic:  Alert and oriented x 3. Skin:  Intact without lesions or rashes. Cervical Nodes:  no significant adenopathy Axillary Nodes:  no significant adenopathy Inguinal Nodes:  no significant adenopathy Psych:  Normal affect.    Impression & Recommendations:  Problem # 1:  CAD, NATIVE VESSEL (ICD-414.01) The patient has no new symptoms. He's had an extensive recent workup. He will continue with secondary risk reduction. No further testing is indicated.  Problem  # 2:  HYPERLIPIDEMIA-MIXED (ICD-272.4) I reviewed his lipids from November with an LDL of 68 and an HDL of 37. Of note he has had chronically elevated liver enzymes and these are stable and being followed. He will continue on the meds as listed with liver enzyme followup per his primary providers and specialists managing his HIV.  Patient Instructions: 1)  Your physician recommends that you continue on your current medications as directed. Please refer to the Current Medication list given to you today. 2)  Your physician wants you to follow-up in:37months. You will receive a reminder letter in the mail about two months in advance. If you don't receive a letter, please call our office to schedule the follow-up appointment.

## 2010-06-11 NOTE — Progress Notes (Signed)
Summary: PHONE:  ? LAB TEST  Phone Note Call from Patient Call back at [facility] Phone [phone]   Caller: Patient Summary of Call: PAT HAS APPT WITH DR. HOCHREIN ON 02-20-2009 HE WANTED TO KNOW IF HE WAS SUPPOSE TO HAVE LAB WORK DRAWN AGAIN. Initial call taken by: Zachary George,  January 29, 2009 2:04 PM  Follow-up for Phone Call        Notified pt that it does not appear he needs any labs before OV> Also received a surgical clearance from [facility] today for spinal surgery. Pt states they are hoping to plan this for the first of Nov. Will discuss clearance with pt at 10/12 OV with Dr. Antoine Poche. Follow-up by: Cyril Loosen, RN, BSN,  January 30, 2009 12:35 PM

## 2010-06-11 NOTE — Miscellaneous (Signed)
Summary: REhab Report/ CARDIAC REHAB DISCHARGE SUMMARY  REhab Report/ CARDIAC REHAB DISCHARGE SUMMARY   Imported By: Dorise Hiss 03/21/2009 11:52:59  _____________________________________________________________________  External Attachment:    Type:   Image     Comment:   External Document

## 2010-06-11 NOTE — Letter (Signed)
Summary: [facility] D/C DR. Bea Laura  [facility] D/C DR. GAIL GERENA   Imported By: Zachary George 01/15/2010 11:47:40  _____________________________________________________________________  External Attachment:    Type:   Image     Comment:   External Document

## 2010-06-11 NOTE — Letter (Signed)
Summary: External Correspondence/ NEUROSURGERY VISIT DR. BAGLEY  External Correspondence/ NEUROSURGERY VISIT DR. BAGLEY   Imported By: Dorise Hiss 07/19/2009 09:47:37  _____________________________________________________________________  External Attachment:    Type:   Image     Comment:   External Document

## 2010-06-11 NOTE — Progress Notes (Signed)
Summary: Does need labs before 9/20 OV  Phone Note Call from Patient Call back at Home Phone [phone]   Summary of Call: Pt left message on voicemail asking if he needs labs done before 9/20 appt.   There is no reminder or mention in last office note of labs that need to be done before office visit. However, pt takes Crestor. Last FLP/LFT in chart is from December 2010. If pt has not had this done recently with primary MD, he can do before appt.  Left message to call back on machine.  Initial call taken by: Cyril Loosen, RN, BSN,  January 08, 2010 3:08 PM  Follow-up for Phone Call        Pt would like to have labs done before appt to check cholesterol. He doesn't think he has done this recently. Follow-up by: Cyril Loosen, RN, BSN,  January 09, 2010 9:55 AM

## 2010-06-11 NOTE — Miscellaneous (Signed)
Summary: Rehab Report/ CARDIAC REHAB PROGRESS REPORT  Rehab Report/ CARDIAC REHAB PROGRESS REPORT   Imported By: Dorise Hiss 03/06/2009 12:25:48  _____________________________________________________________________  External Attachment:    Type:   Image     Comment:   External Document

## 2010-06-11 NOTE — Consult Note (Signed)
Summary: CARDIOLOGY CONSULT/ [facility]  CARDIOLOGY CONSULT/ [facility]   Imported By: Zachary George 07/11/2009 10:55:51  _____________________________________________________________________  External Attachment:    Type:   Image     Comment:   External Document

## 2010-06-11 NOTE — Progress Notes (Signed)
Summary: PAST DUE LABS  Phone Note Outgoing Call Call back at [facility] Phone [phone]   Call placed by: Carlye Grippe,  May 01, 2009 8:47 AM Call placed to: Patient Summary of Call: CALLED AND INFORMED PATIENT THAT HE NEEDED TO GET LABS DONE AT [facility] FUNCTION. PATIENT SAID HE WOULD GO TODAY. Initial call taken by: Carlye Grippe,  May 01, 2009 8:47 AM

## 2010-08-20 LAB — CBC
HCT: 19.6 % — ABNORMAL LOW (ref 39.0–52.0)
HCT: 23.1 % — ABNORMAL LOW (ref 39.0–52.0)
HCT: 23.4 % — ABNORMAL LOW (ref 39.0–52.0)
HCT: 23.8 % — ABNORMAL LOW (ref 39.0–52.0)
HCT: 24.6 % — ABNORMAL LOW (ref 39.0–52.0)
HCT: 31.3 % — ABNORMAL LOW (ref 39.0–52.0)
HCT: 35.2 % — ABNORMAL LOW (ref 39.0–52.0)
HCT: 36.4 % — ABNORMAL LOW (ref 39.0–52.0)
HCT: 37.6 % — ABNORMAL LOW (ref 39.0–52.0)
HCT: 38.7 % — ABNORMAL LOW (ref 39.0–52.0)
HCT: 39 % (ref 39.0–52.0)
HCT: 35.5 % — ABNORMAL LOW (ref 39.0–52.0)
HCT: 35.7 % — ABNORMAL LOW (ref 39.0–52.0)
HCT: 36.7 % — ABNORMAL LOW (ref 39.0–52.0)
Hemoglobin: 10.7 g/dL — ABNORMAL LOW (ref 13.0–17.0)
Hemoglobin: 12.1 g/dL — ABNORMAL LOW (ref 13.0–17.0)
Hemoglobin: 12.5 g/dL — ABNORMAL LOW (ref 13.0–17.0)
Hemoglobin: 13.1 g/dL (ref 13.0–17.0)
Hemoglobin: 13.3 g/dL (ref 13.0–17.0)
Hemoglobin: 13.5 g/dL (ref 13.0–17.0)
Hemoglobin: 6.9 g/dL — CL (ref 13.0–17.0)
Hemoglobin: 8.1 g/dL — ABNORMAL LOW (ref 13.0–17.0)
Hemoglobin: 8.1 g/dL — ABNORMAL LOW (ref 13.0–17.0)
Hemoglobin: 8.3 g/dL — ABNORMAL LOW (ref 13.0–17.0)
Hemoglobin: 8.6 g/dL — ABNORMAL LOW (ref 13.0–17.0)
Hemoglobin: 12.2 g/dL — ABNORMAL LOW (ref 13.0–17.0)
Hemoglobin: 12.3 g/dL — ABNORMAL LOW (ref 13.0–17.0)
Hemoglobin: 12.6 g/dL — ABNORMAL LOW (ref 13.0–17.0)
MCHC: 34.3 g/dL (ref 30.0–36.0)
MCHC: 34.3 g/dL (ref 30.0–36.0)
MCHC: 34.3 g/dL (ref 30.0–36.0)
MCHC: 34.4 g/dL (ref 30.0–36.0)
MCHC: 34.5 g/dL (ref 30.0–36.0)
MCHC: 34.7 g/dL (ref 30.0–36.0)
MCHC: 34.7 g/dL (ref 30.0–36.0)
MCHC: 34.8 g/dL (ref 30.0–36.0)
MCHC: 35 g/dL (ref 30.0–36.0)
MCHC: 35.2 g/dL (ref 30.0–36.0)
MCHC: 35.3 g/dL (ref 30.0–36.0)
MCHC: 34.3 g/dL (ref 30.0–36.0)
MCHC: 34.4 g/dL (ref 30.0–36.0)
MCHC: 34.5 g/dL (ref 30.0–36.0)
MCV: 109.1 fL — ABNORMAL HIGH (ref 78.0–100.0)
MCV: 109.2 fL — ABNORMAL HIGH (ref 78.0–100.0)
MCV: 110.3 fL — ABNORMAL HIGH (ref 78.0–100.0)
MCV: 116.9 fL — ABNORMAL HIGH (ref 78.0–100.0)
MCV: 117.5 fL — ABNORMAL HIGH (ref 78.0–100.0)
MCV: 117.5 fL — ABNORMAL HIGH (ref 78.0–100.0)
MCV: 117.7 fL — ABNORMAL HIGH (ref 78.0–100.0)
MCV: 117.7 fL — ABNORMAL HIGH (ref 78.0–100.0)
MCV: 117.8 fL — ABNORMAL HIGH (ref 78.0–100.0)
MCV: 117.9 fL — ABNORMAL HIGH (ref 78.0–100.0)
MCV: 118.8 fL — ABNORMAL HIGH (ref 78.0–100.0)
MCV: 117.5 fL — ABNORMAL HIGH (ref 78.0–100.0)
MCV: 117.8 fL — ABNORMAL HIGH (ref 78.0–100.0)
MCV: 118 fL — ABNORMAL HIGH (ref 78.0–100.0)
Platelets: 101 10*3/uL — ABNORMAL LOW (ref 150–400)
Platelets: 107 10*3/uL — ABNORMAL LOW (ref 150–400)
Platelets: 115 10*3/uL — ABNORMAL LOW (ref 150–400)
Platelets: 115 10*3/uL — ABNORMAL LOW (ref 150–400)
Platelets: 115 10*3/uL — ABNORMAL LOW (ref 150–400)
Platelets: 129 10*3/uL — ABNORMAL LOW (ref 150–400)
Platelets: 133 10*3/uL — ABNORMAL LOW (ref 150–400)
Platelets: 141 10*3/uL — ABNORMAL LOW (ref 150–400)
Platelets: 142 10*3/uL — ABNORMAL LOW (ref 150–400)
Platelets: 145 10*3/uL — ABNORMAL LOW (ref 150–400)
Platelets: 86 10*3/uL — ABNORMAL LOW (ref 150–400)
Platelets: 170 10*3/uL (ref 150–400)
Platelets: 172 10*3/uL (ref 150–400)
Platelets: 177 10*3/uL (ref 150–400)
RBC: 1.65 MIL/uL — ABNORMAL LOW (ref 4.22–5.81)
RBC: 2.09 MIL/uL — ABNORMAL LOW (ref 4.22–5.81)
RBC: 2.1 MIL/uL — ABNORMAL LOW (ref 4.22–5.81)
RBC: 2.15 MIL/uL — ABNORMAL LOW (ref 4.22–5.81)
RBC: 2.18 MIL/uL — ABNORMAL LOW (ref 4.22–5.81)
RBC: 2.66 MIL/uL — ABNORMAL LOW (ref 4.22–5.81)
RBC: 2.99 MIL/uL — ABNORMAL LOW (ref 4.22–5.81)
RBC: 3.1 MIL/uL — ABNORMAL LOW (ref 4.22–5.81)
RBC: 3.19 MIL/uL — ABNORMAL LOW (ref 4.22–5.81)
RBC: 3.28 MIL/uL — ABNORMAL LOW (ref 4.22–5.81)
RBC: 3.34 MIL/uL — ABNORMAL LOW (ref 4.22–5.81)
RBC: 3.01 MIL/uL — ABNORMAL LOW (ref 4.22–5.81)
RBC: 3.03 MIL/uL — ABNORMAL LOW (ref 4.22–5.81)
RBC: 3.12 MIL/uL — ABNORMAL LOW (ref 4.22–5.81)
RDW: 14.3 % (ref 11.5–15.5)
RDW: 14.4 % (ref 11.5–15.5)
RDW: 14.4 % (ref 11.5–15.5)
RDW: 14.5 % (ref 11.5–15.5)
RDW: 14.6 % (ref 11.5–15.5)
RDW: 14.6 % (ref 11.5–15.5)
RDW: 14.6 % (ref 11.5–15.5)
RDW: 14.7 % (ref 11.5–15.5)
RDW: 23.9 % — ABNORMAL HIGH (ref 11.5–15.5)
RDW: 24.4 % — ABNORMAL HIGH (ref 11.5–15.5)
RDW: 24.8 % — ABNORMAL HIGH (ref 11.5–15.5)
RDW: 14.3 % (ref 11.5–15.5)
RDW: 14.6 % (ref 11.5–15.5)
RDW: 14.8 % (ref 11.5–15.5)
WBC: 11 10*3/uL — ABNORMAL HIGH (ref 4.0–10.5)
WBC: 14.1 10*3/uL — ABNORMAL HIGH (ref 4.0–10.5)
WBC: 14.6 10*3/uL — ABNORMAL HIGH (ref 4.0–10.5)
WBC: 4.6 10*3/uL (ref 4.0–10.5)
WBC: 4.7 10*3/uL (ref 4.0–10.5)
WBC: 5.1 10*3/uL (ref 4.0–10.5)
WBC: 5.6 10*3/uL (ref 4.0–10.5)
WBC: 6.2 10*3/uL (ref 4.0–10.5)
WBC: 6.4 10*3/uL (ref 4.0–10.5)
WBC: 7.2 10*3/uL (ref 4.0–10.5)
WBC: 7.5 10*3/uL (ref 4.0–10.5)
WBC: 6.7 10*3/uL (ref 4.0–10.5)
WBC: 7 10*3/uL (ref 4.0–10.5)
WBC: 7.8 10*3/uL (ref 4.0–10.5)

## 2010-08-20 LAB — CARDIAC PANEL(CRET KIN+CKTOT+MB+TROPI)
CK, MB: 2.2 ng/mL (ref 0.3–4.0)
CK, MB: 2.3 ng/mL (ref 0.3–4.0)
CK, MB: 197.4 ng/mL — ABNORMAL HIGH (ref 0.3–4.0)
CK, MB: 237.8 ng/mL — ABNORMAL HIGH (ref 0.3–4.0)
CK, MB: 262.8 ng/mL — ABNORMAL HIGH (ref 0.3–4.0)
Relative Index: INVALID (ref 0.0–2.5)
Relative Index: INVALID (ref 0.0–2.5)
Relative Index: 10.2 — ABNORMAL HIGH (ref 0.0–2.5)
Relative Index: 10.6 — ABNORMAL HIGH (ref 0.0–2.5)
Relative Index: 11.2 — ABNORMAL HIGH (ref 0.0–2.5)
Total CK: 75 U/L (ref 7–232)
Total CK: 94 U/L (ref 7–232)
Total CK: 1757 U/L — ABNORMAL HIGH (ref 7–232)
Total CK: 2327 U/L — ABNORMAL HIGH (ref 7–232)
Total CK: 2468 U/L — ABNORMAL HIGH (ref 7–232)
Troponin I: 0.05 ng/mL (ref 0.00–0.06)
Troponin I: 0.06 ng/mL (ref 0.00–0.06)
Troponin I: 40.51 ng/mL (ref 0.00–0.06)
Troponin I: 72.55 ng/mL (ref 0.00–0.06)
Troponin I: >100 ng/mL (ref 0.00–0.06)

## 2010-08-20 LAB — APTT
aPTT: 111 seconds — ABNORMAL HIGH (ref 24–37)
aPTT: 26 seconds (ref 24–37)
aPTT: 28 seconds (ref 24–37)
aPTT: 31 seconds (ref 24–37)
aPTT: 31 seconds (ref 24–37)
aPTT: 37 seconds (ref 24–37)
aPTT: 44 seconds — ABNORMAL HIGH (ref 24–37)
aPTT: 96 seconds — ABNORMAL HIGH (ref 24–37)
aPTT: 96 seconds — ABNORMAL HIGH (ref 24–37)
aPTT: 97 seconds — ABNORMAL HIGH (ref 24–37)

## 2010-08-20 LAB — POCT I-STAT 3, ART BLOOD GAS (G3+)
Acid-Base Excess: 1 mmol/L (ref 0.0–2.0)
Acid-base deficit: 1 mmol/L (ref 0.0–2.0)
Acid-base deficit: 1 mmol/L (ref 0.0–2.0)
Acid-base deficit: 2 mmol/L (ref 0.0–2.0)
Bicarbonate: 22.2 mEq/L (ref 20.0–24.0)
Bicarbonate: 23.2 mEq/L (ref 20.0–24.0)
Bicarbonate: 24 mEq/L (ref 20.0–24.0)
Bicarbonate: 25.4 mEq/L — ABNORMAL HIGH (ref 20.0–24.0)
Bicarbonate: 25.4 mEq/L — ABNORMAL HIGH (ref 20.0–24.0)
O2 Saturation: 100 %
O2 Saturation: 93 %
O2 Saturation: 98 %
O2 Saturation: 99 %
O2 Saturation: 99 %
Patient temperature: 36.4
Patient temperature: 36.8
Patient temperature: 36.8
TCO2: 23 mmol/L (ref 0–100)
TCO2: 24 mmol/L (ref 0–100)
TCO2: 25 mmol/L (ref 0–100)
TCO2: 27 mmol/L (ref 0–100)
TCO2: 27 mmol/L (ref 0–100)
pCO2 arterial: 30.1 mmHg — ABNORMAL LOW (ref 35.0–45.0)
pCO2 arterial: 34.4 mmHg — ABNORMAL LOW (ref 35.0–45.0)
pCO2 arterial: 36.7 mmHg (ref 35.0–45.0)
pCO2 arterial: 43.7 mmHg (ref 35.0–45.0)
pCO2 arterial: 47 mmHg — ABNORMAL HIGH (ref 35.0–45.0)
pH, Arterial: 7.341 — ABNORMAL LOW (ref 7.350–7.450)
pH, Arterial: 7.347 — ABNORMAL LOW (ref 7.350–7.450)
pH, Arterial: 7.435 (ref 7.350–7.450)
pH, Arterial: 7.447 (ref 7.350–7.450)
pH, Arterial: 7.473 — ABNORMAL HIGH (ref 7.350–7.450)
pO2, Arterial: 141 mmHg — ABNORMAL HIGH (ref 80.0–100.0)
pO2, Arterial: 148 mmHg — ABNORMAL HIGH (ref 80.0–100.0)
pO2, Arterial: 342 mmHg — ABNORMAL HIGH (ref 80.0–100.0)
pO2, Arterial: 71 mmHg — ABNORMAL LOW (ref 80.0–100.0)
pO2, Arterial: 92 mmHg (ref 80.0–100.0)

## 2010-08-20 LAB — PREPARE PLATELETS

## 2010-08-20 LAB — POCT I-STAT 4, (NA,K, GLUC, HGB,HCT)
Glucose, Bld: 101 mg/dL — ABNORMAL HIGH (ref 70–99)
Glucose, Bld: 111 mg/dL — ABNORMAL HIGH (ref 70–99)
Glucose, Bld: 118 mg/dL — ABNORMAL HIGH (ref 70–99)
Glucose, Bld: 124 mg/dL — ABNORMAL HIGH (ref 70–99)
Glucose, Bld: 127 mg/dL — ABNORMAL HIGH (ref 70–99)
Glucose, Bld: 155 mg/dL — ABNORMAL HIGH (ref 70–99)
HCT: 25 % — ABNORMAL LOW (ref 39.0–52.0)
HCT: 26 % — ABNORMAL LOW (ref 39.0–52.0)
HCT: 26 % — ABNORMAL LOW (ref 39.0–52.0)
HCT: 35 % — ABNORMAL LOW (ref 39.0–52.0)
HCT: 36 % — ABNORMAL LOW (ref 39.0–52.0)
HCT: 38 % — ABNORMAL LOW (ref 39.0–52.0)
Hemoglobin: 11.9 g/dL — ABNORMAL LOW (ref 13.0–17.0)
Hemoglobin: 12.2 g/dL — ABNORMAL LOW (ref 13.0–17.0)
Hemoglobin: 12.9 g/dL — ABNORMAL LOW (ref 13.0–17.0)
Hemoglobin: 8.5 g/dL — ABNORMAL LOW (ref 13.0–17.0)
Hemoglobin: 8.8 g/dL — ABNORMAL LOW (ref 13.0–17.0)
Hemoglobin: 8.8 g/dL — ABNORMAL LOW (ref 13.0–17.0)
Potassium: 3.1 mEq/L — ABNORMAL LOW (ref 3.5–5.1)
Potassium: 3.6 mEq/L (ref 3.5–5.1)
Potassium: 3.7 mEq/L (ref 3.5–5.1)
Potassium: 3.8 mEq/L (ref 3.5–5.1)
Potassium: 3.9 mEq/L (ref 3.5–5.1)
Potassium: 3.9 mEq/L (ref 3.5–5.1)
Sodium: 138 mEq/L (ref 135–145)
Sodium: 139 mEq/L (ref 135–145)
Sodium: 139 mEq/L (ref 135–145)
Sodium: 140 mEq/L (ref 135–145)
Sodium: 141 mEq/L (ref 135–145)
Sodium: 142 mEq/L (ref 135–145)

## 2010-08-20 LAB — GLUCOSE, CAPILLARY
Glucose-Capillary: 100 mg/dL — ABNORMAL HIGH (ref 70–99)
Glucose-Capillary: 100 mg/dL — ABNORMAL HIGH (ref 70–99)
Glucose-Capillary: 101 mg/dL — ABNORMAL HIGH (ref 70–99)
Glucose-Capillary: 104 mg/dL — ABNORMAL HIGH (ref 70–99)
Glucose-Capillary: 104 mg/dL — ABNORMAL HIGH (ref 70–99)
Glucose-Capillary: 108 mg/dL — ABNORMAL HIGH (ref 70–99)
Glucose-Capillary: 110 mg/dL — ABNORMAL HIGH (ref 70–99)
Glucose-Capillary: 111 mg/dL — ABNORMAL HIGH (ref 70–99)
Glucose-Capillary: 112 mg/dL — ABNORMAL HIGH (ref 70–99)
Glucose-Capillary: 112 mg/dL — ABNORMAL HIGH (ref 70–99)
Glucose-Capillary: 117 mg/dL — ABNORMAL HIGH (ref 70–99)
Glucose-Capillary: 117 mg/dL — ABNORMAL HIGH (ref 70–99)
Glucose-Capillary: 119 mg/dL — ABNORMAL HIGH (ref 70–99)
Glucose-Capillary: 121 mg/dL — ABNORMAL HIGH (ref 70–99)
Glucose-Capillary: 128 mg/dL — ABNORMAL HIGH (ref 70–99)
Glucose-Capillary: 130 mg/dL — ABNORMAL HIGH (ref 70–99)
Glucose-Capillary: 131 mg/dL — ABNORMAL HIGH (ref 70–99)
Glucose-Capillary: 132 mg/dL — ABNORMAL HIGH (ref 70–99)
Glucose-Capillary: 137 mg/dL — ABNORMAL HIGH (ref 70–99)
Glucose-Capillary: 139 mg/dL — ABNORMAL HIGH (ref 70–99)
Glucose-Capillary: 142 mg/dL — ABNORMAL HIGH (ref 70–99)
Glucose-Capillary: 143 mg/dL — ABNORMAL HIGH (ref 70–99)
Glucose-Capillary: 153 mg/dL — ABNORMAL HIGH (ref 70–99)
Glucose-Capillary: 155 mg/dL — ABNORMAL HIGH (ref 70–99)
Glucose-Capillary: 156 mg/dL — ABNORMAL HIGH (ref 70–99)
Glucose-Capillary: 166 mg/dL — ABNORMAL HIGH (ref 70–99)
Glucose-Capillary: 167 mg/dL — ABNORMAL HIGH (ref 70–99)
Glucose-Capillary: 179 mg/dL — ABNORMAL HIGH (ref 70–99)
Glucose-Capillary: 184 mg/dL — ABNORMAL HIGH (ref 70–99)
Glucose-Capillary: 184 mg/dL — ABNORMAL HIGH (ref 70–99)
Glucose-Capillary: 185 mg/dL — ABNORMAL HIGH (ref 70–99)
Glucose-Capillary: 201 mg/dL — ABNORMAL HIGH (ref 70–99)
Glucose-Capillary: 70 mg/dL (ref 70–99)
Glucose-Capillary: 70 mg/dL (ref 70–99)
Glucose-Capillary: 70 mg/dL (ref 70–99)
Glucose-Capillary: 72 mg/dL (ref 70–99)
Glucose-Capillary: 75 mg/dL (ref 70–99)
Glucose-Capillary: 78 mg/dL (ref 70–99)
Glucose-Capillary: 79 mg/dL (ref 70–99)
Glucose-Capillary: 80 mg/dL (ref 70–99)
Glucose-Capillary: 83 mg/dL (ref 70–99)
Glucose-Capillary: 84 mg/dL (ref 70–99)
Glucose-Capillary: 85 mg/dL (ref 70–99)
Glucose-Capillary: 88 mg/dL (ref 70–99)
Glucose-Capillary: 89 mg/dL (ref 70–99)
Glucose-Capillary: 92 mg/dL (ref 70–99)
Glucose-Capillary: 92 mg/dL (ref 70–99)
Glucose-Capillary: 95 mg/dL (ref 70–99)
Glucose-Capillary: 95 mg/dL (ref 70–99)
Glucose-Capillary: 95 mg/dL (ref 70–99)
Glucose-Capillary: 96 mg/dL (ref 70–99)
Glucose-Capillary: 99 mg/dL (ref 70–99)
Glucose-Capillary: 99 mg/dL (ref 70–99)
Glucose-Capillary: 102 mg/dL — ABNORMAL HIGH (ref 70–99)
Glucose-Capillary: 106 mg/dL — ABNORMAL HIGH (ref 70–99)
Glucose-Capillary: 115 mg/dL — ABNORMAL HIGH (ref 70–99)
Glucose-Capillary: 144 mg/dL — ABNORMAL HIGH (ref 70–99)
Glucose-Capillary: 145 mg/dL — ABNORMAL HIGH (ref 70–99)
Glucose-Capillary: 81 mg/dL (ref 70–99)
Glucose-Capillary: 82 mg/dL (ref 70–99)
Glucose-Capillary: 94 mg/dL (ref 70–99)
Glucose-Capillary: 95 mg/dL (ref 70–99)
Glucose-Capillary: 96 mg/dL (ref 70–99)
Glucose-Capillary: 97 mg/dL (ref 70–99)
Glucose-Capillary: 97 mg/dL (ref 70–99)

## 2010-08-20 LAB — CROSSMATCH
ABO/RH(D): A POS
ABO/RH(D): A POS
Antibody Screen: NEGATIVE
Antibody Screen: NEGATIVE

## 2010-08-20 LAB — BASIC METABOLIC PANEL
BUN: 10 mg/dL (ref 6–23)
BUN: 10 mg/dL (ref 6–23)
BUN: 13 mg/dL (ref 6–23)
BUN: 13 mg/dL (ref 6–23)
BUN: 16 mg/dL (ref 6–23)
BUN: 21 mg/dL (ref 6–23)
BUN: 8 mg/dL (ref 6–23)
BUN: 14 mg/dL (ref 6–23)
BUN: 17 mg/dL (ref 6–23)
CO2: 25 mEq/L (ref 19–32)
CO2: 27 mEq/L (ref 19–32)
CO2: 27 mEq/L (ref 19–32)
CO2: 28 mEq/L (ref 19–32)
CO2: 29 mEq/L (ref 19–32)
CO2: 30 mEq/L (ref 19–32)
CO2: 30 mEq/L (ref 19–32)
CO2: 27 mEq/L (ref 19–32)
CO2: 28 mEq/L (ref 19–32)
Calcium: 8 mg/dL — ABNORMAL LOW (ref 8.4–10.5)
Calcium: 8 mg/dL — ABNORMAL LOW (ref 8.4–10.5)
Calcium: 8 mg/dL — ABNORMAL LOW (ref 8.4–10.5)
Calcium: 8.4 mg/dL (ref 8.4–10.5)
Calcium: 8.4 mg/dL (ref 8.4–10.5)
Calcium: 8.5 mg/dL (ref 8.4–10.5)
Calcium: 8.8 mg/dL (ref 8.4–10.5)
Calcium: 7.8 mg/dL — ABNORMAL LOW (ref 8.4–10.5)
Calcium: 8.2 mg/dL — ABNORMAL LOW (ref 8.4–10.5)
Chloride: 100 mEq/L (ref 96–112)
Chloride: 102 mEq/L (ref 96–112)
Chloride: 104 mEq/L (ref 96–112)
Chloride: 105 mEq/L (ref 96–112)
Chloride: 106 mEq/L (ref 96–112)
Chloride: 107 mEq/L (ref 96–112)
Chloride: 110 mEq/L (ref 96–112)
Chloride: 103 mEq/L (ref 96–112)
Chloride: 104 mEq/L (ref 96–112)
Creatinine, Ser: 0.71 mg/dL (ref 0.4–1.5)
Creatinine, Ser: 0.86 mg/dL (ref 0.4–1.5)
Creatinine, Ser: 0.86 mg/dL (ref 0.4–1.5)
Creatinine, Ser: 0.87 mg/dL (ref 0.4–1.5)
Creatinine, Ser: 0.9 mg/dL (ref 0.4–1.5)
Creatinine, Ser: 0.92 mg/dL (ref 0.4–1.5)
Creatinine, Ser: 1.06 mg/dL (ref 0.4–1.5)
Creatinine, Ser: 0.87 mg/dL (ref 0.4–1.5)
Creatinine, Ser: 0.93 mg/dL (ref 0.4–1.5)
GFR calc Af Amer: >60 mL/min (ref >60)
GFR calc Af Amer: >60 mL/min (ref >60)
GFR calc Af Amer: >60 mL/min (ref >60)
GFR calc Af Amer: >60 mL/min (ref >60)
GFR calc Af Amer: >60 mL/min (ref >60)
GFR calc Af Amer: >60 mL/min (ref >60)
GFR calc Af Amer: >60 mL/min (ref >60)
GFR calc Af Amer: >60 mL/min (ref >60)
GFR calc Af Amer: >60 mL/min (ref >60)
GFR calc non Af Amer: >60 mL/min (ref >60)
GFR calc non Af Amer: >60 mL/min (ref >60)
GFR calc non Af Amer: >60 mL/min (ref >60)
GFR calc non Af Amer: >60 mL/min (ref >60)
GFR calc non Af Amer: >60 mL/min (ref >60)
GFR calc non Af Amer: >60 mL/min (ref >60)
GFR calc non Af Amer: >60 mL/min (ref >60)
GFR calc non Af Amer: >60 mL/min (ref >60)
GFR calc non Af Amer: >60 mL/min (ref >60)
Glucose, Bld: 107 mg/dL — ABNORMAL HIGH (ref 70–99)
Glucose, Bld: 109 mg/dL — ABNORMAL HIGH (ref 70–99)
Glucose, Bld: 110 mg/dL — ABNORMAL HIGH (ref 70–99)
Glucose, Bld: 111 mg/dL — ABNORMAL HIGH (ref 70–99)
Glucose, Bld: 138 mg/dL — ABNORMAL HIGH (ref 70–99)
Glucose, Bld: 139 mg/dL — ABNORMAL HIGH (ref 70–99)
Glucose, Bld: 96 mg/dL (ref 70–99)
Glucose, Bld: 103 mg/dL — ABNORMAL HIGH (ref 70–99)
Glucose, Bld: 113 mg/dL — ABNORMAL HIGH (ref 70–99)
Potassium: 3.8 mEq/L (ref 3.5–5.1)
Potassium: 3.9 mEq/L (ref 3.5–5.1)
Potassium: 4 mEq/L (ref 3.5–5.1)
Potassium: 4 mEq/L (ref 3.5–5.1)
Potassium: 4.2 mEq/L (ref 3.5–5.1)
Potassium: 4.2 mEq/L (ref 3.5–5.1)
Potassium: 4.7 mEq/L (ref 3.5–5.1)
Potassium: 3.9 mEq/L (ref 3.5–5.1)
Potassium: 4.1 mEq/L (ref 3.5–5.1)
Sodium: 136 mEq/L (ref 135–145)
Sodium: 136 mEq/L (ref 135–145)
Sodium: 137 mEq/L (ref 135–145)
Sodium: 139 mEq/L (ref 135–145)
Sodium: 140 mEq/L (ref 135–145)
Sodium: 140 mEq/L (ref 135–145)
Sodium: 143 mEq/L (ref 135–145)
Sodium: 135 mEq/L (ref 135–145)
Sodium: 137 mEq/L (ref 135–145)

## 2010-08-20 LAB — PROTIME-INR
INR: 1.1 (ref 0.00–1.49)
INR: 1.1 (ref 0.00–1.49)
INR: 1.1 (ref 0.00–1.49)
INR: 1.2 (ref 0.00–1.49)
INR: 1.2 (ref 0.00–1.49)
INR: 1.3 (ref 0.00–1.49)
INR: 1.4 (ref 0.00–1.49)
INR: 1.7 — ABNORMAL HIGH (ref 0.00–1.49)
INR: 2.7 — ABNORMAL HIGH (ref 0.00–1.49)
INR: 3.6 — ABNORMAL HIGH (ref 0.00–1.49)
INR: 1.3 (ref 0.00–1.49)
INR: 1.4 (ref 0.00–1.49)
INR: 1.7 — ABNORMAL HIGH (ref 0.00–1.49)
Prothrombin Time: 14.7 seconds (ref 11.6–15.2)
Prothrombin Time: 14.9 seconds (ref 11.6–15.2)
Prothrombin Time: 15 seconds (ref 11.6–15.2)
Prothrombin Time: 15.3 seconds — ABNORMAL HIGH (ref 11.6–15.2)
Prothrombin Time: 15.7 seconds — ABNORMAL HIGH (ref 11.6–15.2)
Prothrombin Time: 16.2 seconds — ABNORMAL HIGH (ref 11.6–15.2)
Prothrombin Time: 17.2 seconds — ABNORMAL HIGH (ref 11.6–15.2)
Prothrombin Time: 20.9 seconds — ABNORMAL HIGH (ref 11.6–15.2)
Prothrombin Time: 30.3 seconds — ABNORMAL HIGH (ref 11.6–15.2)
Prothrombin Time: 39 seconds — ABNORMAL HIGH (ref 11.6–15.2)
Prothrombin Time: 16.4 seconds — ABNORMAL HIGH (ref 11.6–15.2)
Prothrombin Time: 17.4 seconds — ABNORMAL HIGH (ref 11.6–15.2)
Prothrombin Time: 20.8 seconds — ABNORMAL HIGH (ref 11.6–15.2)

## 2010-08-20 LAB — BLOOD GAS, ARTERIAL
Acid-Base Excess: 1.8 mmol/L (ref 0.0–2.0)
Bicarbonate: 25.8 mEq/L — ABNORMAL HIGH (ref 20.0–24.0)
Drawn by: 290241
FIO2: 0.21 %
O2 Saturation: 97.1 %
Patient temperature: 98.6
TCO2: 27.1 mmol/L (ref 0–100)
pCO2 arterial: 40.5 mmHg (ref 35.0–45.0)
pH, Arterial: 7.421 (ref 7.350–7.450)
pO2, Arterial: 82.4 mmHg (ref 80.0–100.0)

## 2010-08-20 LAB — URINALYSIS, MICROSCOPIC ONLY
Bilirubin Urine: NEGATIVE
Glucose, UA: NEGATIVE mg/dL
Hgb urine dipstick: NEGATIVE
Ketones, ur: NEGATIVE mg/dL
Leukocytes, UA: NEGATIVE
Nitrite: NEGATIVE
Protein, ur: NEGATIVE mg/dL
Specific Gravity, Urine: 1.013 (ref 1.005–1.030)
Urobilinogen, UA: 1 mg/dL (ref 0.0–1.0)
pH: 7.5 (ref 5.0–8.0)

## 2010-08-20 LAB — POCT I-STAT, CHEM 8
BUN: 14 mg/dL (ref 6–23)
Calcium, Ion: 1.16 mmol/L (ref 1.12–1.32)
Chloride: 97 mEq/L (ref 96–112)
Creatinine, Ser: 0.9 mg/dL (ref 0.4–1.5)
Glucose, Bld: 155 mg/dL — ABNORMAL HIGH (ref 70–99)
HCT: 19 % — ABNORMAL LOW (ref 39.0–52.0)
Hemoglobin: 6.5 g/dL — CL (ref 13.0–17.0)
Potassium: 3.7 mEq/L (ref 3.5–5.1)
Sodium: 135 mEq/L (ref 135–145)
TCO2: 25 mmol/L (ref 0–100)

## 2010-08-20 LAB — HIV-1 RNA QUANT-NO REFLEX-BLD
HIV 1 RNA Quant: <48 copies/mL (ref <48)
HIV-1 RNA Quant, Log: <1.68 {Log} (ref <1.68)

## 2010-08-20 LAB — PREPARE FRESH FROZEN PLASMA

## 2010-08-20 LAB — T-HELPER CELLS (CD4) COUNT (NOT AT ARMC)
CD4 % Helper T Cell: 20 % — ABNORMAL LOW (ref 33–55)
CD4 T Cell Abs: 580 uL (ref 400–2700)

## 2010-08-20 LAB — PLATELET INHIBITION P2Y12
P2Y12 % Inhibition: 41 %
P2Y12 % Inhibition: 54 %
Platelet Function  P2Y12: 137 [PRU] — ABNORMAL LOW (ref 194–418)
Platelet Function  P2Y12: 195 [PRU] (ref 194–418)
Platelet Function Baseline: 301 [PRU] (ref 194–418)
Platelet Function Baseline: 333 [PRU] (ref 194–418)

## 2010-08-20 LAB — LIPID PANEL
Cholesterol: 126 mg/dL (ref 0–200)
HDL: 27 mg/dL — ABNORMAL LOW (ref >39)
LDL Cholesterol: 85 mg/dL (ref 0–99)
Total CHOL/HDL Ratio: 4.7 RATIO
Triglycerides: 72 mg/dL (ref <150)
VLDL: 14 mg/dL (ref 0–40)

## 2010-08-20 LAB — POCT I-STAT GLUCOSE
Glucose, Bld: 123 mg/dL — ABNORMAL HIGH (ref 70–99)
Glucose, Bld: 92 mg/dL (ref 70–99)
Operator id: 190281
Operator id: 238831

## 2010-08-20 LAB — COMPREHENSIVE METABOLIC PANEL
ALT: 22 U/L (ref 0–53)
ALT: 52 U/L (ref 0–53)
AST: 21 U/L (ref 0–37)
AST: 121 U/L — ABNORMAL HIGH (ref 0–37)
Albumin: 3 g/dL — ABNORMAL LOW (ref 3.5–5.2)
Albumin: 2.8 g/dL — ABNORMAL LOW (ref 3.5–5.2)
Alkaline Phosphatase: 69 U/L (ref 39–117)
Alkaline Phosphatase: 67 U/L (ref 39–117)
BUN: 12 mg/dL (ref 6–23)
BUN: 14 mg/dL (ref 6–23)
CO2: 28 mEq/L (ref 19–32)
CO2: 27 mEq/L (ref 19–32)
Calcium: 8.7 mg/dL (ref 8.4–10.5)
Calcium: 8.4 mg/dL (ref 8.4–10.5)
Chloride: 108 mEq/L (ref 96–112)
Chloride: 107 mEq/L (ref 96–112)
Creatinine, Ser: 0.85 mg/dL (ref 0.4–1.5)
Creatinine, Ser: 0.92 mg/dL (ref 0.4–1.5)
GFR calc Af Amer: >60 mL/min (ref >60)
GFR calc Af Amer: >60 mL/min (ref >60)
GFR calc non Af Amer: >60 mL/min (ref >60)
GFR calc non Af Amer: >60 mL/min (ref >60)
Glucose, Bld: 101 mg/dL — ABNORMAL HIGH (ref 70–99)
Glucose, Bld: 112 mg/dL — ABNORMAL HIGH (ref 70–99)
Potassium: 4 mEq/L (ref 3.5–5.1)
Potassium: 3.9 mEq/L (ref 3.5–5.1)
Sodium: 140 mEq/L (ref 135–145)
Sodium: 141 mEq/L (ref 135–145)
Total Bilirubin: 4.6 mg/dL — ABNORMAL HIGH (ref 0.3–1.2)
Total Bilirubin: 1.5 mg/dL — ABNORMAL HIGH (ref 0.3–1.2)
Total Protein: 5.3 g/dL — ABNORMAL LOW (ref 6.0–8.3)
Total Protein: 5.1 g/dL — ABNORMAL LOW (ref 6.0–8.3)

## 2010-08-20 LAB — HEMOGLOBIN A1C
Hgb A1c MFr Bld: 4.7 % (ref 4.6–6.1)
Hgb A1c MFr Bld: 4.6 % (ref 4.6–6.1)
Mean Plasma Glucose: 88 mg/dL
Mean Plasma Glucose: 85 mg/dL

## 2010-08-20 LAB — HEPARIN LEVEL (UNFRACTIONATED)
Heparin Unfractionated: 0.3 IU/mL (ref 0.30–0.70)
Heparin Unfractionated: 0.31 IU/mL (ref 0.30–0.70)
Heparin Unfractionated: 0.39 IU/mL (ref 0.30–0.70)
Heparin Unfractionated: 0.51 IU/mL (ref 0.30–0.70)
Heparin Unfractionated: 0.55 IU/mL (ref 0.30–0.70)
Heparin Unfractionated: 0.58 IU/mL (ref 0.30–0.70)
Heparin Unfractionated: <0.1 IU/mL — ABNORMAL LOW (ref 0.30–0.70)
Heparin Unfractionated: <0.1 IU/mL — ABNORMAL LOW (ref 0.30–0.70)
Heparin Unfractionated: 0.15 IU/mL — ABNORMAL LOW (ref 0.30–0.70)
Heparin Unfractionated: 0.28 IU/mL — ABNORMAL LOW (ref 0.30–0.70)
Heparin Unfractionated: 0.39 IU/mL (ref 0.30–0.70)
Heparin Unfractionated: 0.47 IU/mL (ref 0.30–0.70)
Heparin Unfractionated: 0.53 IU/mL (ref 0.30–0.70)

## 2010-08-20 LAB — CREATININE, SERUM
Creatinine, Ser: 0.91 mg/dL (ref 0.4–1.5)
GFR calc Af Amer: >60 mL/min (ref >60)
GFR calc non Af Amer: >60 mL/min (ref >60)

## 2010-08-20 LAB — HEMOGLOBIN AND HEMATOCRIT, BLOOD
HCT: 24.9 % — ABNORMAL LOW (ref 39.0–52.0)
Hemoglobin: 8.5 g/dL — ABNORMAL LOW (ref 13.0–17.0)

## 2010-08-20 LAB — TSH: TSH: 0.658 u[IU]/mL (ref 0.350–4.500)

## 2010-08-20 LAB — PLATELET COUNT
Platelets: 138 10*3/uL — ABNORMAL LOW (ref 150–400)
Platelets: 98 10*3/uL — ABNORMAL LOW (ref 150–400)

## 2010-08-20 LAB — MAGNESIUM
Magnesium: 2.1 mg/dL (ref 1.5–2.5)
Magnesium: 2.6 mg/dL — ABNORMAL HIGH (ref 1.5–2.5)
Magnesium: 2.2 mg/dL (ref 1.5–2.5)

## 2010-08-20 LAB — PLATELET FUNCTION ASSAY: Collagen / Epinephrine: 154 seconds (ref 0–180)

## 2010-08-20 LAB — ABO/RH: ABO/RH(D): A POS

## 2010-09-24 NOTE — Assessment & Plan Note (Signed)
OFFICE VISIT   Fryer, Macdonald L  DOB:  [birth year]                                        November 10, 2008  CHART #:  [id number]   PRESENT ILLNESS:  The patient is a 60 year old gentleman who underwent  bilateral radial artery and left IMA harvest for CABG x3 in June for  class IV unstable angina.  He developed a seroma in his left forearm  which was drained in the office of approximately 60 mL on October 27, 2008.  He returns now for a followup exam.  He continues to do well without  angina, and incisions are well healed.   PHYSICAL EXAMINATION:  Blood pressure 110/60, pulse 70, respirations 18,  saturation 98%.  The sternum is well healed.  Breath sounds are equal.  Cardiac rhythm is regular.  The left forearm has only a very slight  seroma with a healed incision.  No cellulitis or redness or tenderness.  The right arm is fully healed.  He has good pulses in his hand.   PLAN:  Conservative management.  He will return here if the swelling or  reaccumulation occurs.  Otherwise, he will continue his rehab program.   [name], M.D.  Electronically Signed   PV/MEDQ  D:  11/10/2008  T:  11/11/2008  Job:  [id number]

## 2010-09-24 NOTE — Cardiovascular Report (Signed)
[name], [name]               ACCOUNT NO.:  [id number]   MEDICAL RECORD NO.:  [id number]          PATIENT TYPE:  INP   LOCATION:  2903                         FACILITY:  [facility]   PHYSICIAN:  Verne Carrow, MDDATE OF BIRTH:  1951-03-29   DATE OF PROCEDURE:  09/15/2008  DATE OF DISCHARGE:                            CARDIAC CATHETERIZATION   PRIMARY CARDIOLOGIST:  None.   PROCEDURE PERFORMED:  1. Left heart catheterization  2. Selective coronary angiography.  3. Left ventricular angiogram.  4. Thrombectomy with a Fetch catheter in the proximal LAD.  5. Percutaneous coronary intervention with placement of a bare-metal      stent in the proximal left anterior descending coronary artery.  6. Placement of an Angio-Seal femoral artery closure device.   OPERATOR:  Verne Carrow, MD   INDICATIONS:  This is a 60 year old Caucasian male with a history of HIV  and diabetes mellitus, who was mowing his grass today when he had the  sudden onset of crushing, heavy chest pain.  The patient alerted EMS and  was brought to [facility] Emergency Department where he was found to have  ST-segment elevation in leads V1 and V2.  A code STEMI was activated and  the patient was transported emergently to [facility] for an  emergent heart catheterization.  Emergency consent was obtained.   PROCEDURE IN DETAIL:  The patient was brought directly into the cardiac  catheterization laboratory at [facility] via EMS from [facility].  Emergency consent was obtained.  The right groin was prepped  and draped in sterile fashion.  Lidocaine 1% was used for local  anesthesia.  A 6-French sheath was inserted into the right femoral  artery without difficulty.  Standard diagnostic catheters were used to  perform a left heart catheterization.  Of note, the patient arrived at  [facility] at 1:48 p.m.  Access was obtained at 2:05 p.m.  The  first device deployment was 2:22  p.m.   Following the diagnostic catheterization, we immediately turned our  attention to the totally occluded proximal left anterior descending  coronary artery.  The patient was given a bolus of Angiomax and a drip  was started.  We did not give Plavix initially on the table because the  patient had been given sedative medication and was unable to swallow  these Plavix pills.  An XB LAD 3.5 guiding catheter was used to  selectively engage the left main coronary artery.  Once the ACT was  greater than 200, a cougar intracoronary wire was passed down the left  anterior descending artery.  Minimal flow was reestablished into the mid  and distal vessel with the passage of the wire through the total  occlusion.  A Fetch thrombectomy catheter was then passed over the wire  into the area of total occlusion.  There was some thrombus extraction.  At this point, the Fetch catheter was removed and a 2.5 x 15-mm balloon  was inflated 2 times in overlapping fashion in the proximal LAD.  A 3.0  x 23-mm Vision bare-metal stent was then deployed in the proximal  left  anterior descending coronary artery.  A 3.25 x 20-mm noncompliant  balloon was inflated to 16 atmospheres within the stent.  There was an  excellent angiographic result.  There was TIMI-0 flow prior to the  intervention and TIMI III flow post intervention.  The occlusion was  100% at the beginning of the case and was 0% at the conclusion of the  case.   HEMODYNAMIC FINDINGS:  Central aortic pressure 117/71, left ventricular  pressure 83/5, left ventricular end-diastolic pressure 17.   ANGIOGRAPHIC FINDINGS:  1. The left main coronary artery had a distal tapering to 50%.  2. The left anterior descending was found to be 100% occluded in the      proximal portion at the beginning of the case.  After the vessel      was opened, it was obvious that there was a moderate-sized diagonal      branch down beyond the total occlusion that had an  80% ostial      stenosis.  Just beyond that diagonal branch was a 60-70% stenosis      in the mid LAD.  There was plaque noted in the distal LAD.  3. The circumflex artery gave off an early ramus intermedius branch      that had an ostial 50% stenosis.  This was a moderate-sized vessel.      There was a 40% stenosis in the proximal portion of the ramus      intermedius.  The ostial circumflex had a 40% stenosis.  The      proximal circumflex had a 50% stenosis.  The first obtuse marginal      was a small-to-moderate caliber vessel with a 70% ostial stenosis.      The second obtuse marginal was a large bifurcating vessel with      plaque disease only.  4. The right coronary artery had diffuse 40% plaque in the proximal      portion.  The mid vessel had a 70-75% stenosis.  The distal right      coronary artery had plaque disease.  The posterior descending      artery and posterolateral branch only had mild plaque disease.  5. Left ventricular angiogram was performed in the RAO projection and      showed mild hypokinesis of the apex.  The overall left ventricular      systolic function was preserved.  Ejection fraction was 50-55%.      There was no evidence of mitral regurgitation.   IMPRESSION:  1. Acute anterior ST-segment elevation myocardial infarction with      total occlusion of the proximal left anterior descending coronary      artery.  The patient is now status post placement of a bare-metal      stent in the proximal left anterior descending coronary artery.  2. Mild segmental left ventricular dysfunction.   RECOMMENDATIONS:  The patient should be continued on aspirin and Plavix  for at least 6 weeks, but preferably longer and up to 1 year if he  tolerates this.  He was given 600 mg of Plavix here in the cath lab and  will be started on 150 mg of Plavix on a daily basis for 1 week and then  75 mg on daily basis for as long as he tolerates this medication.  He  will be started  on high-dose statin agent and a low-dose beta-blocker.  We will hold on starting an ACE inhibitor today secondary to  his  relative hypotension.  The patient does have a history of recent DVT and  has been maintained on Coumadin therapy.  We will resume his heparin  drip 6 hours after the sheath has been removed.  He will be admitted to  the CCU and watched closely.      Verne Carrow, MD  Electronically Signed     CM/MEDQ  D:  09/15/2008  T:  09/16/2008  Job:  [id number]

## 2010-09-24 NOTE — Discharge Summary (Signed)
[name], [name]               ACCOUNT NO.:  [id number]   MEDICAL RECORD NO.:  [id number]          PATIENT TYPE:  INP   LOCATION:  2903                         FACILITY:  [facility]   PHYSICIAN:  Verne Carrow, MDDATE OF BIRTH:  11-May-1951   DATE OF ADMISSION:  09/15/2008  DATE OF DISCHARGE:                               DISCHARGE SUMMARY   PRIMARY CARDIOLOGIST:  Will be new, Dr. Verne Carrow, MD   PRIMARY CARE PHYSICIAN:  Dr. Jake Samples in [place], [place].   HISTORY OF PRESENT ILLNESS:  A 60 year old Caucasian male without prior  history of CAD with known history of diabetes, hypertension, HIV, and  diverticulitis who was mowing the lawn this morning around 10 a.m.,  began to have some left arm pain and left-sided chest pain with  associated diaphoresis, shortness of breath, and nausea.  The patient  had twinges this a.m. upon awakening, but they went away on their own  and he also had some twinges of chest pain last week at rest.  He called  EMS and was brought to [facility] and EKG revealed ST elevated  MI.  He had emergent transport to [facility] where he is  currently undergoing emergent cardiac catheterization per Dr.  Verne Carrow.   REVIEW OF SYSTEMS:  Positive for substernal chest pain, left arm pain,  shortness of breath, nausea, and vomiting.  All other systems are  reviewed and found to be negative.   PAST MEDICAL HISTORY:  1. HIV positive.  2. Diverticulitis.  3. Addison disease.  4. Diabetes.  5. DVT bilaterally, currently on Coumadin.  6. Hypertension.  7. Genital herpes.   PAST SURGICAL HISTORY:  The patient states he has had multiple  surgeries, although they are unknown at this time.   SOCIAL HISTORY:  The patient is married.  He does not smoke, does not  drink alcohol, no illicit drug use at present.   FAMILY HISTORY:  Both parents died of CAD and M I.  He has 2 brothers  who also are deceased from CAD and MI.   CURRENT MEDICATIONS:  1. Combivir 100 mg b.i.d.  2. Lyrica 100 mg t.i.d.  3. Metformin 150 mg daily.  4. Omega-3 one g once a day.  5. Hydrocortisone 20 mg b.i.d.  6. Alendronate 35 mg once a week.  7. Valtrex 500 mg b.i.d. p.r.n. outbreaks.  8. Cialis 20 mg p.r.n.  9. Lovaza 1 g 4 times a day.  10.Reyataz 150 mg twice a day.   ALLERGIES:  PENICILLIN, PHENERGAN, and MORPHINE.   CURRENT LABORATORIES:  BUN 20, creatinine 0.99, PT 14.2, INR 1.1,  hemoglobin of 14.4, hematocrit 41.4, white blood cell 10.8, platelets  241.  EKG reveal sinus bradycardia, ventricular rate of 64 beats per  minute with anterior ST elevation.  Chest x-ray is pending.   PHYSICAL EXAMINATION:  VITAL SIGNS:  Blood pressure 114/76, pulse 70,  respirations 26, O2 sat 100% on 2 L.  GENERAL:  He is awake, alert, oriented, complaining of 6/10 left-sided  chest pain along with arm pain.  HEENT:  Head is normocephalic and atraumatic.  Eyes, PERRLA.  Mucous  membranes, mouth pink and moist.  Tongue is midline.  NECK:  Supple.  No JVD or carotid bruits appreciated.  CARDIOVASCULAR:  Regular rate and rhythm without murmurs.  LUNGS:  Bibasilar crackles noted anterolaterally.  ABDOMEN:  Soft, nontender.  Bowel sounds 2+.  EXTREMITIES:  Without clubbing, cyanosis, or edema.  NEURO:  Cranial nerves II through XII are grossly intact.   IMPRESSION:  1. Anterior ST elevated myocardial infarction.  2. History of diabetes.  3. History of hypertension.  4. History of human immunodeficiency virus positive.  5. Addison disease.  6. History of deep venous thrombosis bilaterally, currently on      Coumadin.   PLAN:  A 60 year old Caucasian male admitted with anterior ST-elevated  MI who was emergently sent to [facility] for cardiac  catheterization and intervention per Dr. Verne Carrow at his  discretion.  The patient is currently undergoing catheterization at the  time of dictation.  More recommendations  will be had during hospital  course with catheterization.      [name]. Lyman Bishop, NP      Verne Carrow, MD  Electronically Signed   KML/MEDQ  D:  09/15/2008  T:  09/16/2008  Job:  [id number]   cc:   Dr. Jake Samples

## 2010-09-24 NOTE — Assessment & Plan Note (Signed)
OFFICE VISIT   [name], [name]  DOB:  1951-01-26                                        October 27, 2008  CHART #:  [id number]   CURRENT PROBLEMS:  1. Status post coronary artery bypass graft x3 using arterial conduit      for class IV unstable angina with severe multivessel coronary      artery disease on Oct 05, 2008.  2. Diabetes mellitus.  3. Chronic Coumadin therapy for history of deep vein thrombosis with      bilateral lower extremity venous insufficiency and vein stripping.  4. Human immunodeficiency virus positive.  5. Addison disease, on chronic steroid therapy.   PRESENT ILLNESS:  The patient is a 60 year old Caucasian male, who  returns for his first office visit after multivessel bypass grafting  using arterial conduit to his severe venous insufficiency of his lower  extremities and unsuitable saphenous vein for conduit.  Bilateral radial  artery grafts were used as well as left internal mammary artery and at  the time of surgery the left IMA was grafted to his LAD and the radial  artery grafts were placed to his circumflex marginal and distal RCA.  He  did very well following surgery and was discharged home on the third  postoperative day in sinus rhythm.  He resumed his long-term Coumadin  for his history of DVT and also was sent home on hydrocortisone 20  b.i.d., Combivir, Lyrica, Norvir, Crestor 10 mg daily, and Imdur 15 mg  daily.  He was on Plavix preoperatively, but that was discontinued.  He  has had no recurrent angina and the surgical incisions are healing well.  He does have some swelling of his left forearm and a seroma was  aspirated of 50 mL of serosanguineous fluid in the office.   PHYSICAL EXAMINATION:  VITAL SIGNS:  Blood pressure 130/77, pulse 88 and  regular, respirations 18, and saturation on room air is 98%.  GENERAL:  He is alert and pleasant.  LUNGS:  Breath sounds are clear and equal.  CARDIAC:  Rhythm is regular  without murmur or rub.  CHEST:  The sternal incision is stable and well healed.  EXTREMITIES:  Both arm incisions are well healed and he has good hand  grip bilaterally.  A seroma in the left forearm compartment was  aspirated with sterile technique and returned 50 mL of serosanguineous  fluid and a compression dressing was applied for 24 hours.   The PA and lateral chest x-ray shows clear lung fields without  significant pleural effusions.  The sternal wires were well aligned and  intact.   PLAN:  The patient will continue his current medications but can  discontinue the Imdur.  He can drive and do light activities and I will  see him back in 2 weeks for followup of the left forearm seroma.  His  activity restrictions were reviewed again with the patient.   [name], M.D.  Electronically Signed   PV/MEDQ  D:  10/27/2008  T:  10/28/2008  Job:  [id number]   cc:   Christopher Carrow, MD  Dr. Jake [name]

## 2010-09-24 NOTE — Assessment & Plan Note (Signed)
[facility]                          [facility] OFFICE NOTE   NAME:[name], [name]                      MRN:          [id number]  DATE:11/08/2008                            DOB:          02-Aug-1950    PRIMARY CARE [name]:  Sheela Stack, MD in [place].   REASON FOR PRESENTATION:  Evaluate the patient status post CABG.   HISTORY OF PRESENT ILLNESS:  The patient presents for followup after  bypass surgery.  He presented with chest discomfort and initially had a  cardiac catheterization and subsequent stenting of a proximal LAD lesion  on Sep 15, 2008.  He had recurrent angina and underwent a repeat cardiac  catheterization on Oct 02, 2008.  This demonstrated left main 50-60%  stenosis.  The LAD had 80% stenosis beyond the takeoff of the first  diagonal branch.  The proximal stent was patent.  The circumflex had 80-  90% ostial stenosis.  Ramus intermediate had 60-70% stenosis.  It was a  small to moderate size vessel.  Small marginal had 80% stenosis.  The  right coronary artery had an ulcerated 80% stenosis in the mid segment.  The EF was 40-45%.  The patient had stenting by Dr. [name] with a  LIMA to the LAD, left radial graft to the circumflex and marginal, and  right radial graft to the distal right coronary artery.  He did well  with this procedure.  Since going home, he has had none of the chest  discomfort that he had previously.  He denies any neck or arm  discomfort.  He has had no fevers, chills, or cough.  He has had no  change in his bowel habits.  He has been doing some mild activities and  some minimal yard work without bringing on any discomfort.  He has been  seen by Dr. [name].  He had a seroma in his left arm, which is being  followed.   PAST MEDICAL HISTORY:  1. Deep venous thrombosis on chronic Coumadin therapy.  2. Diabetes mellitus.  3. HIV positive.  4. Addison disease.  5. Hypertension.  6. Dyslipidemia.  7. Diverticulosis.  8. Genital herpes.  9. Fundoplication for reflux.  10.Cholecystectomy.   ALLERGIES AND INTOLERANCE:  PENICILLIN, PHENERGAN, MORPHINE, and  NEURONTIN.   MEDICATIONS:  Hydrocortisone, Combivir, Lyrica, Reyataz, Norvir 100 mg  at bedtime, Crestor 10 mg daily, Coumadin, metoprolol 25 mg daily,  aspirin 81 mg daily, Valtrex, isosorbide 15 mg daily, metformin 500 mg  daily, vitamin D, calcium, multivitamin.   REVIEW OF SYSTEMS:  As stated in HPI, otherwise negative for other  systems.   PHYSICAL EXAMINATION:  GENERAL:  The patient looks well and is in no  distress.  VITAL SIGNS:  Blood pressure 111/66, heart rate 67 and regular.  HEENT:  Eyes are unremarkable.  Pupils equal, round, and reactive to  light.  Fundi not visualized.  Oral mucosa normal.  NECK:  No jugular venous distention to 45 degrees.  Carotid upstroke  brisk and symmetric.  No bruits.  No thyromegaly.  LYMPHATICS:  No nodes.  LUNGS:  Clear to auscultation bilaterally.  BACK:  No costovertebral angle tenderness.  CHEST:  Well-healed sternotomy scar.  HEART:  PMI not displaced or sustained, S1 and S2 within normal limits.  No S3, no S4, no clicks, no rubs, no murmurs.  ABDOMEN:  Flat, positive bowel sounds, normal in frequency and pitch.  No bruits, rebound, guarding, or midline pulsatile mass.  No  hepatomegaly.  No splenomegaly.  SKIN:  No rashes.  EXTREMITIES:  2+ pulses, no edema.  Bilateral radial harvest sites  intact.   EKG:  Sinus rhythm, left atrial enlargement, anterior infarct.  Lateral  T-wave inversions.  No old EKGs for comparison.   ASSESSMENT/PLAN:  1. Coronary artery disease.  The patient is doing well status post      bypass.  He has had resolution of longstanding chest discomfort.      At this point, no further cardiovascular testing is suggested.  We      will continue with aggressive risk reduction.  2. Dyslipidemia.  We will get a repeat lipid profile in 6 weeks.  The       plan will be an LDL less than 100 and HDL greater than 40.  3. Diabetes.  Per his primary care physician.  He continues on the      medications as listed.  4. Deep venous thrombosis.  The patient has been on longstanding      Coumadin and I will defer to his primary care physician.  5. Followup.  We will see the patient again in about 3 months or      sooner if needed.     Rollene Rotunda, MD, [facility]  Electronically Signed    JH/MedQ  DD: 11/08/2008  DT: 11/09/2008  Job #: [id number]   cc:   Sheela Stack, MD

## 2010-09-24 NOTE — Consult Note (Signed)
[name], [name]               ACCOUNT NO.:  [id number]   MEDICAL RECORD NO.:  [id number]          PATIENT TYPE:  INP   LOCATION:  2502                         FACILITY:  [facility]   PHYSICIAN:  [name], M.D.  DATE OF BIRTH:  11/10/1950   DATE OF CONSULTATION:  10/02/2008  DATE OF DISCHARGE:                                 CONSULTATION   REFERRING PHYSICIAN:  __________ .   CARDIOLOGIST:  Jonelle Sidle, M.D. at [facility].   PRIMARY CARE PHYSICIAN:  Sheela Stack, M.D. in [place], [place].   REASON FOR CONSULTATION:  Multivessel coronary disease with unstable  angina.   CHIEF COMPLAINT:  Chest pain.   HISTORY OF PRESENT ILLNESS:  I was asked to evaluate this 60 year old  Caucasian diabetic male, nonsmoker, for possible multivessel coronary  bypass grafting after recently being diagnosed with severe three-vessel  coronary disease.  The patient presented  with an acute anterior MI on  May 7 with occlusion of the LAD treated with percutaneous intervention  and a bare metal stent.  He did well on Plavix therapy and was at home  for 10 days when he had recurrent resting chest pain radiating to his  neck and jaw, associated with shortness of breath and nausea but no  vomiting.  The nitroglycerin helped relieve the pain.  He reported back  to the [facility] emergency department.  A 12-lead EKG at that time showed new  ST-segment changes and his CPK-MB was 3.1 with a troponin of 0.06.  He  was placed on heparin and transferred to [facility] on May 20.  This chest pain resolved and he underwent elective left heart cath today  .[name]  This demonstrated EF of 40% with anterior apical hypokinesia, no  mitral regurgitation.  Left main had a 50% stenosis.  The LAD had a  patent stent but had a mid vessel 80% stenosis.  The circumflex had an  ostial 90% stenosis and there was a small ramus with a 50-70% stenosis.  The right coronary had a long 60-80% ulcerated lesion  in the mid vessel.  The patient now is felt to be a potential candidate for multivessel  surgical evaluation due to his recurrent chest pain following PCI and  severe multivessel disease.   PAST MEDICAL HISTORY:  1. History of DVT on Coumadin for the past 2 years and history of      bilateral remote greater saphenous vein stripping operations.  2. Diabetes mellitus.  3. HIV positive.  4. Addison's disease on chronic steroids.  5. Hypertension.  6. Dyslipidemia.  7. Diverticulosis.  8. History of genital herpes  9. Status post surgical repair of hiatal hernia cholecystectomy.   ALLERGIES:  PENICILLIN  with a rash and swelling.   HOME MEDICATIONS:  1. Coumadin 10 mg a day,  2. Aspirin 325 mg daily.  3. Plavix 75 mg daily.  4. Toprol XL 25 mg daily.  5. Crestor 10 mg daily.  6. Combivir one tablet b.i.d.  7. Norvir 100 mg at bedtime.  8. Lyrica 100 mg t.i.d.  9. Hydrocortisone 20 mg  b.i.d.  10.Vitamin D 10,000 units daily.  11.Reyataz 300 mg at bedtime.  12.Valtrex 500 mg daily.  13.Calcium 1 tablet daily.   SOCIAL HISTORY:  The patient is married with adult daughters.  He does  not drink alcohol or smoke.  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  He denies illicit drug  use.[name]   FAMILY HISTORY:  Strongly positive for coronary disease, myocardial  infarction and his father had heart bypass surgery.  Several brothers  have had intervention for severe coronary disease.   REVIEW OF SYSTEMS:  He has no recent history of fever.  He did lose  weight with his MI early in May, but regained the 6 pounds.  He denies a  history of thoracic trauma or abnormal chest x-ray, productive cough or  hemoptysis.  He has had bilateral breast reductions in the past.  CARDIAC:  Review is positive for his coronary disease, negative for  history of valvular disease, murmur or arrhythmia.  He denies any  difficulty swallowing and he has no active dental  complaints.  GI:  Review is positive for his repair of hiatal hernia, cholecystectomy and  negative for hepatitis, jaundice or blood per rectum.  NEUROLOGIC:  Review is negative.  VASCULAR:  Review is positive for his severe venous  insufficiency with both lower extremities with DVTs as well as history  of bilateral vein stripping remotely.  He has been on Coumadin for the  past 2 years.  No history of pulmonary bullae. NEUROLOGIC:  Review is  negative for stroke or seizure.  He states he feels his carotid arteries  are not diseased from recent scan at an outside hospital.  He has diet-  controlled diabetes and sometimes requires metformin depending on the  steroids that he is on.  He has Addison's disease and has been on  prednisone 20 mg b.i.d. for several years.  He takes higher doses of  steroids for his elective operations with it previously.  No history of  stroke or seizure.   PHYSICAL EXAMINATION:  VITAL SIGNS: He is Is 5 feet 10, weighs 190  pounds, blood pressure 100/60, pulse 62, respirations 10, saturation 98%  on room air.  GENERAL APPEARANCE:  Middle-aged Caucasian male in his hospital room  accompanied by his daughters, in no acute distress.  HEENT:  Exam is normocephalic.  Dentition good.  NECK:  Without JVD, mass or bruit.  LYMPHATICS:  No palpable supraclavicular or cervical adenopathy.  Breath  sounds are clear and equal.  CARDIAC:  Regular rhythm without S3 gallop  or murmur.  ABDOMEN:  Soft, nontender with healed surgical scars.  There is no  pulsatile mass.  EXTREMITIES:  Reveal no clubbing, cyanosis or edema.  He has strong  pulses in both upper extremities and lower extremities.  He has varicose  veins in his lower extremities and a scar over the left knee from a knee  replacement.  NEUROLOGIC:  Alert and oriented x3 without focal motor deficit.   LABORATORY DATA:  Reviewed the coronary coronary arteriograms and he has  severe three-vessel disease with  moderate LV dysfunction with his recent  anterior MI.  A 2-D echo is pending to assess for valvular insufficiency  and a chest x-ray is also pending to evaluate his pulmonary status.   PLAN:  The patient would benefit from multivessel bypass grafting.  However, conduit will be an issue due to his severe venous insufficiency  of both lower extremities.  He would benefit from grafting the LAD, OM  and distal RCA with probable arterial grafts (left IMA, left radial, and  right radial).  If this conduit is not available or adequate,  cryopreserved pain would be the backup.  Will need to stop the Plavix  for few days before surgery. Will follow with serial platelet inhibition  assay.  Surgery is tentatively scheduled for May 27 to allow a Plavix  washout which will be followed by serial platelet inhibition assay.   Thank you for the consultation.      [name], M.D.  Electronically Signed     PV/MEDQ  D:  10/02/2008  T:  10/02/2008  Job:  [id number]

## 2010-09-24 NOTE — Cardiovascular Report (Signed)
NAMECHARLIES, [name]               ACCOUNT NO.:  [id number]   MEDICAL RECORD NO.:  [id number]           PATIENT TYPE:   LOCATION:                                 FACILITY:   PHYSICIAN:  Verne Carrow, MDDATE OF BIRTH:  1950/12/12   DATE OF PROCEDURE:  10/02/2008  DATE OF DISCHARGE:                            CARDIAC CATHETERIZATION   PROCEDURE PERFORMED:  1. Left heart catheterization  2. Selective coronary angiography.  3. Left ventricular angiogram.  4. Placement of an Angio-Seal femoral artery closure device.   OPERATOR:  Verne Carrow, MD   INDICATIONS:  This is a 60 year old Caucasian male with a history of  hypertension, hyperlipidemia, diabetes mellitus, HIV positive status,  and deep venous thrombosis in September 2009, who was admitted to [facility] on Sep 15, 2008, with an anterior ST elevation myocardial  infarction.  The patient underwent emergent left heart catheterization  at that time and received a bare-metal stent into the proximal left  anterior descending coronary artery.  The patient was noted to have  multiple areas of residual moderate-to-severe stenosis at that time.  He  was discharged to home with plans to manage him medically if possible.  He presented back to [facility] on Sep 28, 2008, as a transfer  from [facility] with complaints of  chest pain.  He ruled out for a myocardial infarction with serial  cardiac enzymes but did have some T-wave inversions on his EKG that were  concerning for ischemic changes.  The patient's catheterization was  delayed on Friday, Sep 29, 2008, secondary to his INR being greater than  3.  The patient is on chronic Coumadin for his DVT.  Diagnostic left  heart catheterization was planned for today.  Of note, the patient has  continued to have episodes of chest pain during the hospitalization  while on a nitroglycerin and heparin drip.   DETAILS OF  PROCEDURE:  The patient was brought to the inpatient cardiac  catheterization laboratory after signing informed consent for the  procedure.  The right groin was prepped and draped in a sterile fashion.  Lidocaine 1% was used for local anesthesia.  Standard diagnostic  catheters were used to perform selective coronary angiography.  A 5-  French pigtail catheter was used to perform a left ventricular  angiogram.  There was no gradient noted across the aortic valve on  pullback of the catheter.  The patient tolerated the procedure well.  An  Angio-Seal femoral artery closure device was placed in the right femoral  artery following the procedure.  The patient was taken to the holding  area in stable condition.   HEMODYNAMIC FINDINGS:  Central aortic pressure 84/51.  Left ventricular  pressure 105/3.  Left ventricular end-diastolic pressure 12.   ANGIOGRAPHIC FINDINGS:  1. The left main coronary artery has a distal 50-60% stenosis.  This      bifurcates into a small-to-moderate size ramus intermedius branch,      moderate-to-large size circumflex branch, and a large sized      diagonal branch.  2. The left  anterior descending is a large vessel that courses to the      apex.  There is a stent noted in the proximal portion of the vessel      that has no evidence of restenosis.  The midportion of the LAD has      an 80% stenosis just beyond the takeoff of the first diagonal      branch.  There is no significant disease noted in the distal LAD.      The first diagonal branch has a long tubular stenosis extending      from the ostium down through the first 10 mm of the vessel.  3. The circumflex artery appears to have an 80-90% ostial stenosis.      In the proximal portion of the circumflex, there is a discrete 80%      stenosis.  There is plaque noted in the midportion of the vessel.      There is an early ramus intermedius branch that is small-to-      moderate size and appears to have at  least 60-70% disease in the      ostium.  The first obtuse marginal is a small-caliber vessel that      has ostial 80% stenosis.  The second obtuse marginal has plaque      disease only.  4. The right coronary artery is a large dominant vessel that has a      long segment of diffuse disease throughout the proximal portion.      This appears to be 50% throughout the entire proximal portion.  The      midportion of the vessel has a discrete ulcerated appearing 80%      stenosis.  The distal right coronary artery bifurcates into      posterior descending artery and a posterolateral branch.  There is      no significant disease noted in these branches.  The right coronary      artery is a dominant vessel.  5. Left ventricular angiogram was performed in the RAO projection and      shows anteroapical hypokinesis with an overall ejection fraction of      40-45%.  There is no significant mitral regurgitation noted.   IMPRESSION:  1. Triple-vessel coronary artery disease.  2. Unstable angina.  3. Mild segmental left ventricular dysfunction.   RECOMMENDATIONS:  This patient is having rest angina and is found to  have multivessel coronary artery disease.  The stent that was placed in  the proximal LAD during the acute anterior ST-elevation myocardial  infarction on Sep 15, 2008, is patent.  The patient is found to have  distal left main stenosis as well as significant disease in the mid LAD,  the first diagonal, the proximal circumflex, and the mid right coronary  artery.  This patient has been managed on Coumadin for deep venous  thrombosis and has been told that he should remain on Coumadin for the  remainder of his life.  His disease is complex and would require  multivessel stenting if a percutaneous route was opted for.  I think at  this time surgical revascularization may be the best option for this  patient.  I will place a consultation to my surgical colleagues and get  their opinion.   The patient has been managed on Plavix over the last 2-  1/2 weeks, but does have a bare-metal stent in the proximal LAD.  Timing  of a possible surgery would  depend on the amount of time allowed for  Plavix washout and the risk of having him off Plavix with the recent  stent placement in the proximal LAD.  I thought if that we would keep  him in the hospital while he is waiting on bypass and gave him on a  heparin drip at least while we are holding the Plavix.      Verne Carrow, MD  Electronically Signed     CM/MEDQ  D:  10/02/2008  T:  10/02/2008  Job:  [id number]

## 2010-09-24 NOTE — Discharge Summary (Signed)
[name], [name]               ACCOUNT NO.:  [id number]   MEDICAL RECORD NO.:  [id number]          PATIENT TYPE:  INP   LOCATION:  3737                         FACILITY:  [facility]   PHYSICIAN:  Verne Carrow, MDDATE OF BIRTH:  12-14-1950   DATE OF ADMISSION:  09/15/2008  DATE OF DISCHARGE:  09/18/2008                               DISCHARGE SUMMARY   PRIMARY CARDIOLOGIST:  Jonelle Sidle, MD   PRIMARY CARE Herold Salguero:  Dr. Jake Samples in [place], [place].   DISCHARGE DIAGNOSIS:  Acute anterior ST-segment elevation myocardial  infarction.   SECONDARY DIAGNOSES:  1. Coronary artery disease, status post successful percutaneous      coronary intervention and bare-metal stenting of the proximal left      anterior descending with placement of a 3.0 x 23 mm multi-link      vision bare-metal stent.  The patient had nonobstructive coronary      artery disease in the remainder of his coronary tree.  Ejection      fraction was 50% to 55% with apical hypokinesis.  2. Human immunodeficiency virus.  3. Diet-controlled diabetes mellitus.  4. History of hypertension.  5. Mild hyperlipidemia.  6. History of bilateral deep venous thrombosis on chronic Coumadin.  7. Genital herpes.  8. History of Addison disease.  9. History of diverticulitis.   ALLERGIES:  PENICILLIN, PHENERGAN, and MORPHINE.   PROCEDURES:  Left heart cardiac catheterization with successful  percutaneous coronary intervention and stenting of the left anterior  descending with 3.0 x 23 mm Vision bare-metal stent.   HISTORY OF PRESENT ILLNESS:  A 60 year old Caucasian male without prior  cardiac history.  He was in his usual state of health until the morning  of Sep 15, 2008, when approximately 10 a.m., he began experiencing left  arm pain and left-sided chest discomfort associated with diaphoresis and  dyspnea and nausea while mowing his lawn.  The patient called the EMS  and was taken to the [facility] where  ECG revealed anterior ST-  segment elevation.  Code STEMI was activated and he was taken to the  [facility] for emergent catheterization and evaluation.   HOSPITAL COURSE:  The patient underwent emergent cardiac catheterization  revealing a totally occluded proximal LAD and otherwise nonobstructive  disease with normal LV function and apical hypokinesis.  Attention was  turned to the LAD and this was successfully stented with a 3.0 x 23 mm  Vision bare-metal stent.  The bare-metal stent was chosen as the patient  is on chronic Coumadin therapy.  Post PCI, the patient was matched in  the coronary intensive care unit and was initiated on beta-blocker and  low-dose statin therapy.  We were unable to initiate ACE inhibitor  secondary to borderline hypotension.  We chose low-dose Crestor because  of his concomitant usage of Combivir and Norvir.  Mr. Frix has had no  additional chest discomfort and was transferred to the floor on Sep 17, 2008.  He has been ambulating without recurrent symptoms or limitations  and has been reinitiated on Coumadin therapy.  His INR today is only  1.7  and given his history of DVTs, we will bridge him with Lovenox at home.  We will check his INR at home and call the results to his physician, Dr.  Jake Samples.  We will follow up LFTs as an outpatient next week.  The patient  has not tolerated Lovaza in the past and we will have to follow him  closely while he is on Crestor.  Mr. Pilch will be discharged home today  in good condition.   DISCHARGE LABORATORY DATA:  Hemoglobin 12.6, hematocrit 36.7, WBC 6.7,  and platelets 172.  INR 1.7, sodium 141, potassium 3.9, chloride 107,  CO2 27, BUN 14, creatinine 0.92, glucose 112, total bilirubin 1.5,  alkaline phosphatase 67, AST 121, ALT 52, total protein 5.1, albumin  2.8, calcium 8.4, magnesium 2.2, hemoglobin A1c 4.6, CK 1757, MB 197.4,  troponin-I 72.55, total cholesterol 126, triglycerides 72, HDL 27, LDL  85, and  TSH 0.102.   DISPOSITION:  The patient will be discharged home today in good  condition.   FOLLOWUP PLANS AND APPOINTMENTS:  We will arrange for followup LFTs next  week at the [facility] in [place].  Follow up with Dr. Nona Dell  on Oct 06, 2008, at 2:15 p.m.  He is to follow up with Dr. Jake Samples in the  next 1-2 week.   DISCHARGE MEDICATIONS:  1. Aspirin 325 mg daily.  2. Plavix 75 mg daily x30 days.  3. Crestor 10 mg at bedtime.  4. Toprol-XL 25 mg daily.  5. Coumadin as previously prescribed.  6. Combivir 1 tab b.i.d.  7. Norvir 100 mg at bedtime.  8. Lyrica 100 mg t.i.d.  9. Hydrocortisone 20 mg b.i.d.  10.Vitamin D3 1000 units 2 tabs daily.  11.Reyataz 300 mg at bedtime.  12.Valtrex 500 mg daily.  13.Nitroglycerin 0.4 mg sublingual p.r.n. chest pain.  14.Calcium 1 tab daily.  15.Lovenox 85 mg b.i.d. until INR therapeutic.   OUTSTANDING LABORATORY STUDIES:  The patient will continue to follow  with INR at home.  We will follow up LFTs next week.   DURATION OF DISCHARGE/ENCOUNTER:  Sixty minutes including physician  time.      Nicolasa Ducking, ANP      Verne Carrow, MD  Electronically Signed    CB/MEDQ  D:  09/18/2008  T:  09/19/2008  Job:  [id number]   cc:   Dr. Jake Samples

## 2010-09-24 NOTE — Discharge Summary (Signed)
NAMEKEILYN, [name]               ACCOUNT NO.:  [id number]   MEDICAL RECORD NO.:  [id number]          PATIENT TYPE:  INP   LOCATION:  2019                         FACILITY:  [facility]   PHYSICIAN:  [name], M.D.  DATE OF BIRTH:  Jul 03, 1950   DATE OF ADMISSION:  09/28/2008  DATE OF DISCHARGE:  10/09/2008                               DISCHARGE SUMMARY   FINAL DIAGNOSIS:  Class IV unstable angina with severe multivessel  coronary artery disease.   IN-HOSPITAL DIAGNOSES:  1. Acute blood loss anemia postoperatively.  2. Thrombocytopenia postoperatively.   SECONDARY DIAGNOSES:  1. History of deep venous thrombosis, on Coumadin for the past 2 years      with history of bilateral remote greater saphenous vein stripping      operations.  2. Diabetes mellitus.  3. Human immunodeficiency virus positive.  4. Addison disease.  5. Chronic steroids.  6. Hypertension.  7. Dyslipidemia.  8. Diverticulosis.  9. History of genital herpes.  10.Status post surgical repair and hiatal hernia.  11.Status post cholecystectomy.   IN-HOSPITAL OPERATIONS AND PROCEDURES:  1. Left heart catheterization.  2. Selective coronary angiography.  3. Left ventricular angiogram.  4. Placement of Angio-Seal femoral artery occlusive device.  5. Coronary artery bypass grafting x3 using a left internal mammary      artery to left anterior descending, left radial artery graft to      circumflex marginal.  6. Common right radial artery graft to distal right coronary artery.  7. Placement of right femoral artery arterial line for blood pressure      monitoring.  8. Open bilateral radial artery harvest.   HISTORY AND PHYSICAL AND HOSPITAL COURSE:  The patient is a 60 year old  Caucasian diabetic smoker who presents with accelerating angina  approximately 2 weeks after having PCI of proximal LAD stenosis.  He had  recurrent typical anginal pain with negative enzymes.  Recatheterization  after a negative CT  scan to rule out PE showed stenosis of 90% beyond  the LAD stent, 50% left main stenosis as well as 70% stenosis of the RCA  and 70% stenosis of OM1.  It was felt that because of his recurrent  angina and progressive coronary artery disease, surgical  revascularization was the best option.  The patient was seen and  evaluated by Dr. Donata [name].  Dr. Donata [name] discussed this patient's  risks and benefits of the procedure.  The patient nods understood and  agreed to proceed.  He was scheduled for Oct 05, 2008.  He had been  placed on Plavix after the stent and the 72-hour Plavix washout was  completed prior to scheduling surgery.  Platelet function testing  performed in the morning of surgery showed adequate platelet function.  For further details of the patient's past medical history and physical  exam, please see dictated H and P.  Preoperatively, the patient did have  bilateral carotid duplex ultrasound done showing on the right to be 40-  60% ICA stenosis.  On the left, had no evidence of significant ICA  stenosis.  The patient remained stable preoperatively.  The patient was taken to the operating room on Oct 05, 2008, where he  underwent coronary artery bypass grafting x3 using left internal mammary  artery graft to left anterior descending, left radial artery graft to  circumflex marginal, right radial artery graft to distal right coronary  artery.  He had bilateral open radial harvest done.  The patient  tolerated this procedure well and was transferred to the intensive care  unit in stable condition.  Postoperatively, the patient was noted to be  hemodynamically stable.  He was extubated early morning postoperatively  on day 1.  Post extubation, the patient noted to be alert and oriented  x4.  Neuro intact.  Postoperatively, the patient was noted to be in  normal sinus rhythm.  Blood pressure stable.  All drips were weaned and  discontinued.  He was started on low-dose  beta-blocker.  The patient  tolerated this well.  He remained in normal sinus rhythm in the  remainder of his postoperative course.  External pacing wires were  deceased without difficulty.  Postoperatively, chest x-ray done on day 1  was stable.  He had minimal drainage from chest tubes and chest tube  discontinued in normal fashion.  The patient was able to be weaned off  oxygen, sating greater than 90% on room air.  He continues using his  incentive spirometer.  Postoperatively, the patient was continued on  steroids and restarted on his HIV meds.  He did have some mild acute  blood loss anemia.  He was started on p.o. iron.  This was followed  closely.  He did receive some transfusions on postop day #1, secondary  to hemoglobin/hematocrit 6.9 and 19.6.  Hemoglobin and hematocrit  improved appropriately following.  By postop day 4, is remaining stable  at 8.1 and 23.1.  He was continued on p.o. iron.  The patient was  asymptomatic.  Postoperatively, he did have some mild thrombocytopenia.  This was followed.  It did drop to 86 on postop day #3, but improved  following day to 101.  He was maintained on low-dose aspirin.  The  patient was restarted on Coumadin due to history of DVTs  postoperatively.  INRs were obtained.  INR on postop day #4, is 1.1.  We  will continue Coumadin.  The patient was progressing well and was  transferred out to the intensive care unit on postop day #2.  On  telemetry floor, he continued to progress well.  He was up ambulating  well without difficulty.  He is tolerating diet well.  No nausea or  vomiting noted.  All incisions clean, dry, and intact and healing well.  On postop day 4, on Oct 09, 2008, he is noted to be afebrile.  He is in  normal sinus rhythm.  Blood pressure is stable.  He is sating 95% on  room air.   LABORATORY WORK:  White blood cell count 7.5, hemoglobin 8.1, hematocrit  23.1, and platelet count 101.  Sodium of 139, potassium of 4.2,  chloride  of 104, bicarbonate 30, BUN of 16, and creatinine 0.90, glucose of 96.  INR of 1.1.  The patient is felt to be stable and ready for discharge  home today, Oct 09, 2008, postop day #4.   FOLLOWUP APPOINTMENTS:  Follow up appointment will be arranged with Dr.  Donata [name] in 3 weeks.  Our office will contact the patient with this  information.  The patient need to obtain PMI chest x-ray 30 minutes  prior to this appointment.  Our office will contact the patient via  nurse in 1 week for staple removal.  The patient will need to follow up  with Dr. Clifton James in 2 weeks.  He need to contact this office to make  these arrangements.  The patient to check PT/INR blood work on October 10, 2008, and will be followed as done preoperatively.   ACTIVITY:  The patient instructed no driving until he released to do so.  No lifting over 10 pounds.  He is told to ambulate 3-4 times per day,  progress as tolerated and continue his breathing exercises.   INCISIONAL CARE:  The patient is told to shower washing his incisions  using soap and water.  He is to contact the office if he develops any  drainage or opening from any of his incision sites.   DIET:  The patient educated on diet to be low-fat, low-salt as well as  diabetic diet.   DISCHARGE MEDICATIONS:  1. Hydrocortisone 20 mg b.i.d.  2. Combivir 1 tablet b.i.d.  3. Lyrica 100 mg t.i.d.  4. Reyataz 150 mg 2 tablets at night.  5. Norvir 100 mg at night.  6. Crestor 10 mg at night.  7. Coumadin 5 mg at night.  8. Metoprolol ER 25 mg daily.  9. Aspirin 81 mg daily.  10.Valtrex 500 mg daily.  11.Vitamin D 2 tablets daily.  12.Multivitamin daily.  13.Calcium 600 mg daily.  14.Metformin 250 mg daily.  15.Imdur 15 mg daily.  16.Nu-Iron 150 mg daily.  17.Aspirin 81 mg daily.  18.Oxycodone 5 mg 1-2 tablets q.4-6 h. p.r.n. pain.      Sol Blazing, PA      [name], M.D.  Electronically Signed    KMD/MEDQ  D:   10/09/2008  T:  10/09/2008  Job:  [id number]   cc:   [name], M.D.  Verne Carrow, MD

## 2010-09-24 NOTE — Op Note (Signed)
NAMESTALEY, LUNZ               ACCOUNT NO.:  [id number]   MEDICAL RECORD NO.:  [id number]           PATIENT TYPE:   LOCATION:                                 FACILITY:   PHYSICIAN:  [name], M.D.  DATE OF BIRTH:  25-Jun-1950   DATE OF PROCEDURE:  10/05/2008  DATE OF DISCHARGE:                               OPERATIVE REPORT   OPERATION:  1. Coronary artery bypass grafting x3 (left internal mammary artery      LAD, left radial artery graft to circumflex marginal, right radial      artery graft to distal right coronary artery).  2. Placement of right femoral arterial A-line for blood pressure      monitoring.   SURGEON:  [name], MD   ASSISTANT:  Rowe Clack, PA-C   PREOPERATIVE DIAGNOSIS:  Class IV unstable angina with severe  multivessel coronary artery disease.   POSTOPERATIVE DIAGNOSIS:  Class IV unstable angina with severe  multivessel coronary artery disease.   ANESTHESIA:  General.   INDICATIONS:  The patient is a 60 year old Caucasian diabetic smoker,  who presents with accelerating angina approximately 2 weeks after having  PCI of a proximal LAD stenosis.  He had a recurrent typical anginal pain  with negative enzymes.  Recatheterization after a negative CT scan to  rule out pulmonary emboli showed stenosis of 90% beyond the LAD stent  and 50% left main stenosis as well as 70% stenosis of the right coronary  proximally and 70% stenosis of the OM-1.  It was felt that because of  his recurrent angina and progressive coronary disease, surgical  revascularization evaluation would be the best option.  The patient had  been placed on Plavix after the stent and a 72-hour Plavix washout was  completed prior to scheduling surgery.  Platelet functional testing  performed the morning of surgery showed adequate platelet function.   Prior to surgery, I examined the patient in his CCU room and reviewed  the results of his cardiac cath in detail with the  patient and his  family.  I discussed the indications and expected benefits of  multivessel coronary artery bypass grafting for treatment of severe  three-vessel coronary artery disease.  I discussed the plan to use all  arterial grafts as he has severe venous insufficiency of his lower  extremities and bilateral vein stripping and history of DVT.  Pre-CABG  Doppler showed his palmar arch circulation to be intact bilaterally, and  the plan was to use his left IMA and bilateral radial arteries.  I  discussed the risks of this operation with the patient including the  risks of bleeding, blood transfusion requirement, infection, and death.  He also understood there is a risk of MI and stroke.  After reviewing  these issues, he demonstrated his understanding and agreed to proceed  with his surgery under what I felt was an informed consent.   OPERATIVE FINDINGS:  The arterial conduits were of good quality.  The  patient did not require any packed cell transfusions but did require a  platelet transfusion following reversal  of heparin with protamine due to  a coagulopathy probably related to his Plavix dosing preoperatively.   PROCEDURE:  The patient was brought to the operating room, placed supine  on the operating table where general anesthesia was induced.  I placed a  right femoral arterial blood pressure monitoring line after the right  groin was prepped and draped as a sterile field.  This was flushed and  attached to the transducer.  Next the chest, abdomen, and legs were  prepped and draped as a sterile field.  Both arms were also prepped out  for radial artery harvesting.  Bilateral arm incisions were made in the  forearm, and bilateral radial arteries were harvested using the Harmonic  scalpel and adequate flow was documented with a Doppler ultrasound  device with clamping of the radial artery to make sure there was an  adequate ulnar pulses.  Low-dose heparin was administered prior  to  clamping the radial arteries.  Both palm incisions were closed in layers  using Vicryl and skin staples on the skin.  The arms were then tucked to  the patient's side.   A sternal incision was then made.  The left internal mammary artery was  harvested as a pedicle graft from its origin at the subclavian vessels.  This was a 1.5-mm vessel and had good flow.  The patient was heparinized  and pursestrings were placed in the ascending aorta and right atrium.  The patient was then cannulated and placed on cardiopulmonary bypass.  The LAD, OM-1, and distal RCA vessels were dissected and found to be  adequate targets for grafting.  Cardioplegic catheters were placed for  both antegrade and retrograde cold blood cardioplegia, and the patient  was cooled to 32 degrees.  The aortic cross-clamp was applied, and 800  mL of cold blood cardioplegia was delivered in split doses between the  antegrade aortic and retrograde coronary sinus catheters.  There was a  good cardioplegic arrest, and septal temperature dropped less than 12  degrees.  Cardioplegia was delivered every 20 minutes or less while the  cross-clamp was applied.   The distal coronary anastomoses were then performed.  The first distal  anastomosis was the distal RCA.  This was a 1.5- x 1.7-mm vessel with  proximal 70-80% stenosis.  The radial artery graft was sewn end-to-side  with running 8-0 Prolene, and there was good flow through the graft.  The second distal anastomosis was the OM-1 branch of the circumflex.  This was a 1.5-mm vessel with proximal 80% stenosis.  The radial artery  graft was sewn end-to-side with running 8-0 Prolene, and there was  excellent flow through graft.  Cardioplegia was redosed.  The third  distal anastomosis was to the distal third of the LAD.  This was a 1.7-  mm vessel with proximal 90% stenosis.  The left IMA pedicle was brought  through an opening created in the left lateral pericardium and was   brought down on the LAD and sewn end to side with running 8-0 Prolene.  There was good flow through the anastomosis after briefly releasing the  pedicle bulldog on the mammary artery.  The bulldog was reapplied, and  the pedicle was secured to the epicardium.   While the cross-clamp was still in place. 2 proximal arterial  anastomoses were placed on the ascending aorta using a 4.0-mm punch with  running 7-0 plain.  Both radial arteries were sewn end to side to the  aorta, and before tying  down the final proximal anastomosis, air was  vented from the coronaries with a dose of retrograde warm blood  cardioplegia.   The cross-clamp was removed, and the heart resumed a spontaneous rhythm.  The grafts all had good flow, and hemostasis was documented at the  proximal and distal anastomoses.  The patient was rewarmed to 37  degrees.  Temporary pacing wires were applied.  The lung was re-  expanded.  The ventilator was resumed.  The patient was then weaned from  bypass on low-dose dopamine with stable blood pressure and good cardiac  output.  Protamine was administered without adverse reaction.  The  cannula was removed.  The mediastinum was irrigated with warm antibiotic  irrigation.  There was still considerable coagulopathy, and the patient  was given platelets and FFP.  The superior pericardial fat was closed  over the aorta.  Two mediastinal and left pleural chest tube were placed  and  brought out through separate incisions.  The sternum was closed with  interrupted steel wire.  The pectoralis fascia was closed with a running  #1 Vicryl.  Subcutaneous and skin layers were closed in running Vicryl  and sterile dressings were applied.  Total bypass time was 120 minutes.      [name], M.D.  Electronically Signed     PV/MEDQ  D:  10/05/2008  T:  10/06/2008  Job:  [id number]   cc:   Verne Carrow, MD

## 2011-01-01 ENCOUNTER — Other Ambulatory Visit: Payer: Self-pay | Admitting: Nurse Practitioner

## 2011-01-27 ENCOUNTER — Encounter (HOSPITAL_COMMUNITY): Payer: Self-pay | Admitting: Radiology

## 2011-01-27 ENCOUNTER — Emergency Department (HOSPITAL_COMMUNITY)
Admission: EM | Admit: 2011-01-27 | Discharge: 2011-01-27 | Disposition: A | Discharge status: Home / Self Care | Payer: Medicare Other | Attending: Emergency Medicine | Admitting: Emergency Medicine

## 2011-01-27 ENCOUNTER — Emergency Department (HOSPITAL_COMMUNITY): Payer: Medicare Other

## 2011-01-27 DIAGNOSIS — E785 Hyperlipidemia, unspecified: Secondary | ICD-10-CM | POA: Insufficient documentation

## 2011-01-27 DIAGNOSIS — I252 Old myocardial infarction: Secondary | ICD-10-CM | POA: Insufficient documentation

## 2011-01-27 DIAGNOSIS — I10 Essential (primary) hypertension: Secondary | ICD-10-CM | POA: Insufficient documentation

## 2011-01-27 DIAGNOSIS — Z21 Asymptomatic human immunodeficiency virus [HIV] infection status: Secondary | ICD-10-CM | POA: Insufficient documentation

## 2011-01-27 DIAGNOSIS — E7800 Pure hypercholesterolemia, unspecified: Secondary | ICD-10-CM | POA: Insufficient documentation

## 2011-01-27 DIAGNOSIS — Z8673 Personal history of transient ischemic attack (TIA), and cerebral infarction without residual deficits: Secondary | ICD-10-CM | POA: Insufficient documentation

## 2011-01-27 DIAGNOSIS — C9110 Chronic lymphocytic leukemia of B-cell type not having achieved remission: Secondary | ICD-10-CM | POA: Insufficient documentation

## 2011-01-27 DIAGNOSIS — R51 Headache: Secondary | ICD-10-CM | POA: Insufficient documentation

## 2011-01-27 DIAGNOSIS — H538 Other visual disturbances: Secondary | ICD-10-CM | POA: Insufficient documentation

## 2011-01-27 DIAGNOSIS — K219 Gastro-esophageal reflux disease without esophagitis: Secondary | ICD-10-CM | POA: Insufficient documentation

## 2011-01-27 DIAGNOSIS — E2749 Other adrenocortical insufficiency: Secondary | ICD-10-CM | POA: Insufficient documentation

## 2011-01-27 DIAGNOSIS — I700 Atherosclerosis of aorta: Secondary | ICD-10-CM | POA: Insufficient documentation

## 2011-01-27 DIAGNOSIS — E119 Type 2 diabetes mellitus without complications: Secondary | ICD-10-CM | POA: Insufficient documentation

## 2011-01-27 HISTORY — DX: Essential (primary) hypertension: I10

## 2011-01-27 LAB — DIFFERENTIAL
Basophils Absolute: 0 10*3/uL (ref 0.0–0.1)
Basophils Relative: 0 % (ref 0–1)
Eosinophils Absolute: 0.2 10*3/uL (ref 0.0–0.7)
Eosinophils Relative: 4 % (ref 0–5)
Lymphocytes Relative: 56 % — ABNORMAL HIGH (ref 12–46)
Lymphs Abs: 2.7 10*3/uL (ref 0.7–4.0)
Monocytes Absolute: 0.4 10*3/uL (ref 0.1–1.0)
Monocytes Relative: 8 % (ref 3–12)
Neutro Abs: 1.5 10*3/uL — ABNORMAL LOW (ref 1.7–7.7)
Neutrophils Relative %: 32 % — ABNORMAL LOW (ref 43–77)

## 2011-01-27 LAB — CBC
HCT: 35.5 % — ABNORMAL LOW (ref 39.0–52.0)
Hemoglobin: 12.1 g/dL — ABNORMAL LOW (ref 13.0–17.0)
MCH: 39 pg — ABNORMAL HIGH (ref 26.0–34.0)
MCHC: 34.1 g/dL (ref 30.0–36.0)
MCV: 114.5 fL — ABNORMAL HIGH (ref 78.0–100.0)
Platelets: 139 10*3/uL — ABNORMAL LOW (ref 150–400)
RBC: 3.1 MIL/uL — ABNORMAL LOW (ref 4.22–5.81)
RDW: 14.4 % (ref 11.5–15.5)
WBC: 4.8 10*3/uL (ref 4.0–10.5)

## 2011-01-27 LAB — POCT I-STAT, CHEM 8
BUN: 19 mg/dL (ref 6–23)
Calcium, Ion: 1.18 mmol/L (ref 1.12–1.32)
Chloride: 101 mEq/L (ref 96–112)
Creatinine, Ser: 0.9 mg/dL (ref 0.50–1.35)
Glucose, Bld: 86 mg/dL (ref 70–99)
HCT: 38 % — ABNORMAL LOW (ref 39.0–52.0)
Hemoglobin: 12.9 g/dL — ABNORMAL LOW (ref 13.0–17.0)
Potassium: 4.3 mEq/L (ref 3.5–5.1)
Sodium: 137 mEq/L (ref 135–145)
TCO2: 25 mmol/L (ref 0–100)

## 2011-01-27 MED ORDER — IOHEXOL 300 MG/ML  SOLN
80.0000 mL | Freq: Once | INTRAMUSCULAR | Status: AC | PRN
  Administered 2011-01-27: 80 mL via INTRAVENOUS

## 2011-01-29 ENCOUNTER — Other Ambulatory Visit: Payer: Self-pay | Admitting: Family Medicine

## 2011-01-29 DIAGNOSIS — IMO0002 Reserved for concepts with insufficient information to code with codable children: Secondary | ICD-10-CM

## 2011-01-31 ENCOUNTER — Ambulatory Visit
Admission: RE | Admit: 2011-01-31 | Discharge: 2011-01-31 | Disposition: A | Discharge status: Home / Self Care | Payer: Medicare Other | Source: Ambulatory Visit | Attending: Family Medicine | Admitting: Family Medicine

## 2011-01-31 DIAGNOSIS — IMO0002 Reserved for concepts with insufficient information to code with codable children: Secondary | ICD-10-CM

## 2011-02-06 NOTE — Consult Note (Signed)
[name], [name]               ACCOUNT NO.:  [id number]  MEDICAL RECORD NO.:  [id number]  LOCATION:  [facility]                         FACILITY:  [facility]  PHYSICIAN:  Thana Farr, MD    DATE OF BIRTH:  November 09, 1950  DATE OF CONSULTATION: DATE OF DISCHARGE:  01/27/2011                                CONSULTATION   REASON FOR CONSULTATION:  Headache.  HISTORY OF PRESENT ILLNESS:  This is a 60 year old male with past medical history of MI, TIA, CABG, HIV (he has a self-proclaimed CD-4 count within normal limits and viral load of normal limits, and sees a physician at [facility]) with normal CD-4 count, Addison disease, CAD, hypercholesterolemia, chronic lymphocytic leukemia, DVT, diabetes, and GERD.  The patient states that he has been having a 43-month history of headache.  However, he has not sought any medical attention until this point.  His headache is described as bilateral throbbing at times in the right posterior region, and at times bilateral in the more parietal region.  The patient denies any diplopia or visual problems.  He does describe that he is having neck pain and back pain for some time.  He is also describing some tingling sensation in the left fingertips, the inside of his left forearm and weakness and left arm and left leg. Since the patient has been admitted to the emergency department, he has received Reglan and Benadryl, and his headache has significantly improved.  PAST MEDICAL HISTORY:  As noted above.  MEDICATIONS:  He is on aspirin, calcium, Cialis, colchicine, Combivir, Crestor, folic acid, Isentress, Lyrica, metformin, methotrexate, Norvir, Reyataz, Valtrex, and Zoloft.  ALLERGIES:  MORPHINE, PENICILLIN, PERCOCET, and PHENERGAN.  SOCIAL HISTORY:  The patient does not smoke, drink, or do illicit drugs.  REVIEW OF SYSTEMS:  Negative with the exception above.  PHYSICAL EXAMINATION:  VITAL SIGNS:  Blood pressure is 108/60, pulse 54, respirations  15. GENERAL:  The patient is alert and oriented x3.  He carries out 2 and 3- step commands. NEUROLOGIC:  Pupils are equal, round, and reactive to light and accommodating.  Conjugate gaze.  Extraocular movements are intact. Visual fields grossly intact.  Face is symmetric.  Tongue is midline. Uvula is midline.  Speech is clear, well-enunciated.  Sensation along V1- V3 is decreased per patient along the left aspect in which he splits midline.  Shoulder shrug, head turn is within normal limits. Coordination of finger-to-nose and heel-to-shin were smooth.  Fine motor movements were smooth. Motor, the patient moves all extremities 4/5 strength.  However, states he feels subjective weakness in his left arm and left leg.  I did not note any tremor or drift.  It is noticeable that he has intrinsic muscle wasting and bilateral hands especially in the thenar eminence of both hands.  Deep tendon reflexes are 2+ throughout.  He has downgoing toes bilaterally.  Sensation is intact.  However, subjectively, he states he has decreased sensation from the left portion of his chest splitting midline to which he has greater sensation on the right.  In his abdomen, he has decreased sensation along the right portion of his abdomen, increasing on the left, and he states he has decreased sensation along  his left leg compared to his right.  LABORATORY DATA:  Sodium is 137, potassium 4.3, chloride 101, CO2 of 25, BUN 19, creatinine is 0.986.  White blood cell count 4.8, platelets 139, hemoglobin 12.1, hematocrit 35.5.  IMAGING:  CT of head with and without contrast was negative for acute mass, bleed, or intracranial abnormalities.  ASSESSMENT:  This is a 60 year old Caucasian male with a history of headache x1 month.  The patient's head CT with and without contrast is negative and headache has significantly improved with Benadryl and Reglan.  The patient's exam is inconsistent particularly with sensation  testing and shows no significant weakness in the upper or lower extremities.  At this time, our recommendations: 1. Treat headache symptomatically.  No further neurological work up recommended. 2. Have the patient followup outpatient for his neck and back pain.  Dr. Thad Ranger has seen and evaluated the patient, agrees with the above mentioned.     Felicie Morn, PA-C   ______________________________ Thana Farr, MD    DS/MEDQ  D:  01/27/2011  T:  01/28/2011  Job:  [id number]  Electronically Signed by Felicie Morn PA-C on 01/31/2011 03:38:40 PM Electronically Signed by Thana Farr MD on 02/06/2011 03:45:36 PM

## 2011-03-13 ENCOUNTER — Other Ambulatory Visit: Payer: Self-pay | Admitting: Nurse Practitioner

## 2011-03-20 ENCOUNTER — Ambulatory Visit: Payer: Self-pay

## 2011-03-20 ENCOUNTER — Ambulatory Visit (INDEPENDENT_AMBULATORY_CARE_PROVIDER_SITE_OTHER): Payer: Medicare Other

## 2011-03-20 ENCOUNTER — Other Ambulatory Visit: Payer: Self-pay | Admitting: Internal Medicine

## 2011-03-20 DIAGNOSIS — Z20828 Contact with and (suspected) exposure to other viral communicable diseases: Secondary | ICD-10-CM

## 2011-03-20 DIAGNOSIS — Z79899 Other long term (current) drug therapy: Secondary | ICD-10-CM

## 2011-03-20 DIAGNOSIS — N529 Male erectile dysfunction, unspecified: Secondary | ICD-10-CM

## 2011-03-20 DIAGNOSIS — B20 Human immunodeficiency virus [HIV] disease: Secondary | ICD-10-CM

## 2011-03-20 DIAGNOSIS — Z113 Encounter for screening for infections with a predominantly sexual mode of transmission: Secondary | ICD-10-CM

## 2011-03-20 DIAGNOSIS — R7309 Other abnormal glucose: Secondary | ICD-10-CM

## 2011-03-21 LAB — LIPID PANEL
Cholesterol: 171 mg/dL (ref 0–200)
HDL: 40 mg/dL (ref >39)
LDL Cholesterol: 94 mg/dL (ref 0–99)
Total CHOL/HDL Ratio: 4.3 Ratio
Triglycerides: 183 mg/dL — ABNORMAL HIGH (ref <150)
VLDL: 37 mg/dL (ref 0–40)

## 2011-03-21 LAB — T-HELPER CELL (CD4) - (RCID CLINIC ONLY)
CD4 % Helper T Cell: 11 % — ABNORMAL LOW (ref 33–55)
CD4 T Cell Abs: 470 uL (ref 400–2700)

## 2011-03-21 LAB — CBC WITH DIFFERENTIAL/PLATELET
Basophils Absolute: 0 10*3/uL (ref 0.0–0.1)
Basophils Relative: 0 % (ref 0–1)
Eosinophils Absolute: 0.2 10*3/uL (ref 0.0–0.7)
Eosinophils Relative: 2 % (ref 0–5)
HCT: 41.6 % (ref 39.0–52.0)
Hemoglobin: 14.1 g/dL (ref 13.0–17.0)
Lymphocytes Relative: 62 % — ABNORMAL HIGH (ref 12–46)
Lymphs Abs: 4.5 10*3/uL — ABNORMAL HIGH (ref 0.7–4.0)
MCH: 40.3 pg — ABNORMAL HIGH (ref 26.0–34.0)
MCHC: 33.9 g/dL (ref 30.0–36.0)
MCV: 118.9 fL — ABNORMAL HIGH (ref 78.0–100.0)
Monocytes Absolute: 0.5 10*3/uL (ref 0.1–1.0)
Monocytes Relative: 7 % (ref 3–12)
Neutro Abs: 2.1 10*3/uL (ref 1.7–7.7)
Neutrophils Relative %: 29 % — ABNORMAL LOW (ref 43–77)
Platelets: 156 10*3/uL (ref 150–400)
RBC: 3.5 MIL/uL — ABNORMAL LOW (ref 4.22–5.81)
RDW: 15.7 % — ABNORMAL HIGH (ref 11.5–15.5)
WBC: 7.3 10*3/uL (ref 4.0–10.5)

## 2011-03-21 LAB — URINALYSIS
Glucose, UA: NEGATIVE mg/dL
Hgb urine dipstick: NEGATIVE
Ketones, ur: NEGATIVE mg/dL
Leukocytes, UA: NEGATIVE
Nitrite: NEGATIVE
Protein, ur: NEGATIVE mg/dL
Specific Gravity, Urine: 1.027 (ref 1.005–1.030)
Urobilinogen, UA: 1 mg/dL (ref 0.0–1.0)
pH: 6.5 (ref 5.0–8.0)

## 2011-03-21 LAB — COMPLETE METABOLIC PANEL WITH GFR
ALT: 29 U/L (ref 0–53)
AST: 31 U/L (ref 0–37)
Albumin: 4.1 g/dL (ref 3.5–5.2)
Alkaline Phosphatase: 102 U/L (ref 39–117)
BUN: 25 mg/dL — ABNORMAL HIGH (ref 6–23)
CO2: 28 mEq/L (ref 19–32)
Calcium: 8.5 mg/dL (ref 8.4–10.5)
Chloride: 99 mEq/L (ref 96–112)
Creat: 0.83 mg/dL (ref 0.50–1.35)
GFR, Est African American: >89 mL/min (ref >89)
GFR, Est Non African American: >89 mL/min (ref >89)
Glucose, Bld: 82 mg/dL (ref 70–99)
Potassium: 4.2 mEq/L (ref 3.5–5.3)
Sodium: 135 mEq/L (ref 135–145)
Total Bilirubin: 3.3 mg/dL — ABNORMAL HIGH (ref 0.3–1.2)
Total Protein: 6 g/dL (ref 6.0–8.3)

## 2011-03-21 LAB — HEPATITIS A ANTIBODY, TOTAL: Hep A Total Ab: POSITIVE — AB

## 2011-03-21 LAB — HEPATITIS C ANTIBODY: HCV Ab: NEGATIVE

## 2011-03-21 LAB — HEPATITIS B SURFACE ANTIBODY,QUALITATIVE: Hep B S Ab: NEGATIVE

## 2011-03-21 LAB — GC/CHLAMYDIA PROBE AMP, URINE
Chlamydia, Swab/Urine, PCR: NEGATIVE
GC Probe Amp, Urine: NEGATIVE

## 2011-03-21 LAB — HEPATITIS B CORE ANTIBODY, TOTAL: Hep B Core Total Ab: NEGATIVE

## 2011-03-25 LAB — HIV-1 RNA ULTRAQUANT REFLEX TO GENTYP+
HIV 1 RNA Quant: 51 copies/mL — ABNORMAL HIGH (ref <20)
HIV-1 RNA Quant, Log: 1.71 {Log} — ABNORMAL HIGH (ref <1.30)

## 2011-04-02 DIAGNOSIS — N529 Male erectile dysfunction, unspecified: Secondary | ICD-10-CM | POA: Insufficient documentation

## 2011-04-02 DIAGNOSIS — E1142 Type 2 diabetes mellitus with diabetic polyneuropathy: Secondary | ICD-10-CM | POA: Insufficient documentation

## 2011-04-02 NOTE — Progress Notes (Signed)
Pt was here for new intake.  I was not able to meet with him due to office emergency.  Labs were drawn. Pt diagnosed in 1993. Records received.

## 2011-04-08 ENCOUNTER — Encounter: Payer: Self-pay | Admitting: Internal Medicine

## 2011-04-08 ENCOUNTER — Ambulatory Visit (INDEPENDENT_AMBULATORY_CARE_PROVIDER_SITE_OTHER): Payer: Medicare Other | Admitting: Internal Medicine

## 2011-04-08 VITALS — BP 109/64 | HR 86 | Temp 97.6°F | Ht 68.0 in | Wt 185.0 lb

## 2011-04-08 DIAGNOSIS — C9112 Chronic lymphocytic leukemia of B-cell type in relapse: Secondary | ICD-10-CM | POA: Insufficient documentation

## 2011-04-08 DIAGNOSIS — B20 Human immunodeficiency virus [HIV] disease: Secondary | ICD-10-CM

## 2011-04-08 DIAGNOSIS — IMO0001 Reserved for inherently not codable concepts without codable children: Secondary | ICD-10-CM | POA: Insufficient documentation

## 2011-04-08 DIAGNOSIS — C9192 Lymphoid leukemia, unspecified, in relapse: Secondary | ICD-10-CM | POA: Insufficient documentation

## 2011-04-08 DIAGNOSIS — M069 Rheumatoid arthritis, unspecified: Secondary | ICD-10-CM

## 2011-04-08 DIAGNOSIS — C9110 Chronic lymphocytic leukemia of B-cell type not having achieved remission: Secondary | ICD-10-CM

## 2011-04-08 NOTE — Assessment & Plan Note (Addendum)
The patient is doing well on his current regimen. He has been on Combivir for a long time and due to the history resistance I am hesitant to change him to a more modern regimen at this time. I will therefore continue him on the current course. We'll have him follow up in 4 months time to assure continued good viral suppression. I did remind him of condom use for all sexual activity. The patient does have a genotype that was done in 2005 and does show significant mutations includingK65R, M184V, V108, L74V, L100I.  Therefore based on the mutations he will be maintained on his current regimen. Additionally his hematologist at [facility] is named [employer].

## 2011-04-08 NOTE — Patient Instructions (Signed)
Follow up in 4 months with labs 2 weeks prior

## 2011-04-08 NOTE — Progress Notes (Signed)
  Subjective:    Patient ID: [name], male    DOB: 10/13/1950, 60 y.o.   MRN: [id number]  HPI patient comes in as a new patient. He has a long history of HIV and has been well controlled on his current regimen which includes for medications. He had been on a regimen of Combivir, Norvirand Reyataz though had persistently low elevated viral loads and therefore about a year ago had Isentressadded and he subsequently has been undetectable since. He has been at [facility] most recently however he does tell me about 10 years ago, he was in [place] getting his care and was noted to have a genotype that was resistant to many of the medications. He says though has not had any problems or required a genotype. He today comes and tells me that he has continued excellent compliance with his medications and has no particular complaints. He is transferring his care here  To [facility] where his primary physician is as well for ease of access. He does still see a hematologist at [facility].    Review of Systems  Constitutional: Negative for fever, chills, fatigue and unexpected weight change.  HENT: Negative for trouble swallowing.   Respiratory: Negative for cough and shortness of breath.   Cardiovascular: Negative for chest pain and leg swelling.  Gastrointestinal: Negative for nausea, abdominal pain and diarrhea.  Genitourinary: Negative for discharge and genital sores.  Skin: Negative for rash.  Neurological: Negative for headaches.  Hematological: Negative for adenopathy.  Psychiatric/Behavioral: Negative for dysphoric mood. The patient is not nervous/anxious.        Objective:   Physical Exam  Constitutional: He is oriented to person, place, and time. He appears well-developed and well-nourished. No distress.  HENT:  Mouth/Throat: Oropharynx is clear and moist. No oropharyngeal exudate.  Cardiovascular: Normal rate, regular rhythm and normal heart sounds.  Exam reveals no gallop and no friction rub.   No  murmur heard. Pulmonary/Chest: Effort normal and breath sounds normal. No respiratory distress. He has no wheezes.  Abdominal: Soft. Bowel sounds are normal. He exhibits no distension. There is no tenderness.  Musculoskeletal:       Notable for deformities in both hands related to RA  Lymphadenopathy:    He has no cervical adenopathy.  Neurological: He is alert and oriented to person, place, and time.  Skin: Skin is warm and dry. No rash noted.  Psychiatric: He has a normal mood and affect. His behavior is normal.          Assessment & Plan:

## 2011-04-09 LAB — T-HELPER CELL (CD4) - (RCID CLINIC ONLY)
CD4 % Helper T Cell: 18 % — ABNORMAL LOW (ref 33–55)
CD4 T Cell Abs: 580 uL (ref 400–2700)

## 2011-04-24 ENCOUNTER — Other Ambulatory Visit: Payer: Self-pay | Admitting: *Deleted

## 2011-04-24 MED ORDER — ROSUVASTATIN CALCIUM 10 MG PO TABS: 10.0000 mg | ORAL_TABLET | Freq: Every day | ORAL | Status: DC

## 2011-04-24 NOTE — Telephone Encounter (Signed)
rx sent to pharmacy

## 2011-04-24 NOTE — Telephone Encounter (Signed)
Mr. Catalfamo called and states that his Crestor 10mg  is out. His pharmacy is [facility] in [place], [place] He has an appointment 05-20-2011 for office follow up. [phone]

## 2011-04-30 ENCOUNTER — Telehealth: Payer: Self-pay | Admitting: *Deleted

## 2011-04-30 NOTE — Telephone Encounter (Signed)
Called patient to inquire about previous cholesterol medications tried since insurance requiring PA for crestor. Patient stated he has never used any other medications for cholesterol in the past.

## 2011-05-08 ENCOUNTER — Telehealth: Payer: Self-pay | Admitting: *Deleted

## 2011-05-08 NOTE — Telephone Encounter (Signed)
?   Testing before next appointment Mr. [name] called and was wondering if he is suppose to be having any type of blood work or testing Before office exam. Please call at his [phone]

## 2011-05-08 NOTE — Telephone Encounter (Signed)
Pt notified he does not appear to need any tests done before appt according to notes.

## 2011-05-20 ENCOUNTER — Encounter: Payer: Self-pay | Admitting: Cardiovascular Disease

## 2011-05-20 ENCOUNTER — Ambulatory Visit (INDEPENDENT_AMBULATORY_CARE_PROVIDER_SITE_OTHER): Payer: Medicare Other | Admitting: Cardiovascular Disease

## 2011-05-20 DIAGNOSIS — I6529 Occlusion and stenosis of unspecified carotid artery: Secondary | ICD-10-CM | POA: Diagnosis not present

## 2011-05-20 DIAGNOSIS — I2510 Atherosclerotic heart disease of native coronary artery without angina pectoris: Secondary | ICD-10-CM | POA: Diagnosis not present

## 2011-05-20 DIAGNOSIS — E785 Hyperlipidemia, unspecified: Secondary | ICD-10-CM | POA: Diagnosis not present

## 2011-05-20 DIAGNOSIS — Z79899 Other long term (current) drug therapy: Secondary | ICD-10-CM

## 2011-05-20 MED ORDER — ATORVASTATIN CALCIUM 10 MG PO TABS: 10.0000 mg | ORAL_TABLET | Freq: Every day | ORAL | Status: DC

## 2011-05-20 MED ORDER — METOPROLOL SUCCINATE ER 25 MG PO TB24: 25.0000 mg | ORAL_TABLET | Freq: Every day | ORAL | Status: DC

## 2011-05-20 NOTE — Assessment & Plan Note (Signed)
Crestor would be no longer covered by his insurance. I recommend switching this to atorvastatin 10 mg once daily. I will check fasting lipid and liver profile in 6 weeks. I do not recommend using a higher dose of atorvastatin than this due to multiple interactions with his HIV medications.

## 2011-05-20 NOTE — Patient Instructions (Addendum)
Your physician you to follow up in 1 year. You will receive a reminder letter in the mail one-two months in advance. If you don't receive a letter, please call our office to schedule the follow-up appointment. Stop Crestor Start Lipitor (atorvastatin) 10 mg-Take 1 tablet by mouth every night.  Your physician recommends that you go to the [facility] for a FASTING lipid profile and liver function labs. Do not eat or drink after midnight. DO LABS IN 6 WEEKS. Your physician has requested that you have a carotid duplex. This test is an ultrasound of the carotid arteries in your neck. It looks at blood flow through these arteries that supply the brain with blood. Allow one hour for this exam. There are no restrictions or special instructions. If the results of your test are normal or stable, you will receive a letter. If they are abnormal, the nurse will contact you by phone.

## 2011-05-20 NOTE — Assessment & Plan Note (Signed)
The patient has known moderate stenosis in the right internal carotid artery which has not been evaluated in more than 2 years. I recommend a followup carotid ultrasound Doppler.

## 2011-05-20 NOTE — Progress Notes (Signed)
HPI  This is a 61 year old male who is here today for a followup visit. He has known history of coronary artery disease status post coronary artery bypass graft surgery in 2010. His ejection fraction was normal by echo. He has not had any cardiac events since his surgery. He has multiple other comorbidities that include HIV infection, hyperlipidemia and rheumatoid arthritis. He is on multiple medications for these conditions. Overall, he has been doing reasonably well. He denies any chest pain or dyspnea. He is not able to do much physical exercise due to back problems. His insurance will no longer cover Crestor. He denies any dizziness, syncope or presyncope. He is on lifelong anticoagulation due to recurrent episodes of DVTs.  Allergies  Allergen Reactions  . Morphine     REACTION: severe headache  . Oxycodone-Acetaminophen     REACTION: headache  . Penicillins     REACTION: red, flushed  . Promethazine Hcl     REACTION: makes him feel drunk     Current Outpatient Prescriptions on File Prior to Visit  Medication Sig Dispense Refill  . aspirin 81 MG chewable tablet Chew 81 mg by mouth daily.        [name] atazanavir (REYATAZ) 300 MG capsule Take 300 mg by mouth daily with breakfast.        . calcium carbonate (OS-CAL) 600 MG TABS Take 600 mg by mouth 2 (two) times daily with a meal.        . cholecalciferol (VITAMIN D) 1000 UNITS tablet Take 1,000 Units by mouth daily.        . colchicine 0.6 MG tablet Take 0.6 mg by mouth 2 (two) times daily.        . folic acid (FOLVITE) 1 MG tablet Take 1 mg by mouth daily.        [name] lamiVUDine-zidovudine (COMBIVIR) 150-300 MG per tablet Take 1 tablet by mouth 2 (two) times daily.        . methotrexate (RHEUMATREX) 2.5 MG tablet Take 2.5 mg by mouth once a week. Caution:Chemotherapy. Protect from light. Take 5 tablets once a week       . metoprolol succinate (TOPROL-XL) 25 MG 24 hr tablet TAKE 1 TABLET BY MOUTH EVERY DAY  30 tablet  0  . pregabalin (LYRICA)  200 MG capsule Take 200 mg by mouth 2 (two) times daily.        . raltegravir (ISENTRESS) 400 MG tablet Take 400 mg by mouth 2 (two) times daily.        . ritonavir (NORVIR) 100 MG capsule Take 100 mg by mouth 1 day or 1 dose.        . sertraline (ZOLOFT) 50 MG tablet Take 50 mg by mouth daily.        . tadalafil (CIALIS) 20 MG tablet Take 20 mg by mouth daily as needed. Take one half tablet as needed       . valACYclovir (VALTREX) 500 MG tablet Take 500 mg by mouth daily.        [name] warfarin (COUMADIN) 10 MG tablet Take 10 mg by mouth daily. Managed by Dr. Darrow Bussing         Past Medical History  Diagnosis Date  . Diabetes mellitus   . Hypertension   . Leg DVT (deep venous thromboembolism), chronic   . Arthritis   . HIV infection   . Cancer   . Myocardial infarction   . Carotid artery occlusion     40-60% right ICA  stenosis (09/2008)  . Coronary artery disease     s/p CABG in 2010  . Hyperlipidemia      Past Surgical History  Procedure Date  . Spine surgery   . Hernia repair   . Cholecystectomy   . Coronary artery bypass graft 2010     No family history on file.   History   Social History  . Marital Status: Widowed    Spouse Name: N/A    Number of Children: N/A  . Years of Education: N/A   Occupational History  . Not on file.   Social History Main Topics  . Smoking status: Never Smoker   . Smokeless tobacco: Never Used  . Alcohol Use: Yes     occasional wine  . Drug Use: No  . Sexually Active: No     pt. given condoms   Other Topics Concern  . Not on file   Social History Narrative  . No narrative on file     PHYSICAL EXAM   BP 120/71  Pulse 67  Ht 5\' 9"  (1.753 m)  Wt 182 lb (82.555 kg)  BMI 26.88 kg/m2 Constitutional: He is oriented to person, place, and time. He appears well-developed and well-nourished. No distress.  HENT: No nasal discharge.  Head: Normocephalic and atraumatic.  Eyes: Pupils are equal, round, and reactive to light.  Right eye exhibits no discharge. Left eye exhibits no discharge.  Neck: Normal range of motion. Neck supple. No JVD present. No thyromegaly present.  Cardiovascular: Normal rate, regular rhythm, normal heart sounds. Exam reveals no gallop and no friction rub.  No murmur heard.  Pulmonary/Chest: Effort normal and breath sounds normal. No stridor. No respiratory distress. He has no wheezes. He has no rales. He exhibits no tenderness.  Abdominal: Soft. Bowel sounds are normal. He exhibits no distension. There is no tenderness. There is no rebound and no guarding.  Musculoskeletal: Normal range of motion. He exhibits no edema and no tenderness.  Neurological: He is alert and oriented to person, place, and time. Coordination normal.  Skin: Skin is warm and dry. No rash noted. He is not diaphoretic. No erythema. No pallor.  Psychiatric: He has a normal mood and affect. His behavior is normal. Judgment and thought content normal.       EKG: Normal sinus rhythm with left atrial enlargement. Nonspecific T wave changes.   ASSESSMENT AND PLAN

## 2011-05-20 NOTE — Assessment & Plan Note (Signed)
The patient seems to be doing reasonably well at this time. He does not have any symptoms suggestive of angina. I recommend continuing medical therapy. Continue small dose aspirin, Toprol and a statin.

## 2011-05-28 ENCOUNTER — Encounter (INDEPENDENT_AMBULATORY_CARE_PROVIDER_SITE_OTHER): Payer: Medicare Other | Admitting: *Deleted

## 2011-05-28 DIAGNOSIS — I6529 Occlusion and stenosis of unspecified carotid artery: Secondary | ICD-10-CM | POA: Diagnosis not present

## 2011-06-04 DIAGNOSIS — Z79899 Other long term (current) drug therapy: Secondary | ICD-10-CM | POA: Diagnosis not present

## 2011-06-09 ENCOUNTER — Encounter: Payer: Self-pay | Admitting: *Deleted

## 2011-06-16 DIAGNOSIS — L570 Actinic keratosis: Secondary | ICD-10-CM | POA: Diagnosis not present

## 2011-06-16 DIAGNOSIS — N433 Hydrocele, unspecified: Secondary | ICD-10-CM | POA: Diagnosis not present

## 2011-06-16 DIAGNOSIS — R7309 Other abnormal glucose: Secondary | ICD-10-CM | POA: Diagnosis not present

## 2011-06-16 DIAGNOSIS — E162 Hypoglycemia, unspecified: Secondary | ICD-10-CM | POA: Diagnosis not present

## 2011-06-26 DIAGNOSIS — I70209 Unspecified atherosclerosis of native arteries of extremities, unspecified extremity: Secondary | ICD-10-CM | POA: Diagnosis not present

## 2011-06-26 DIAGNOSIS — L609 Nail disorder, unspecified: Secondary | ICD-10-CM | POA: Diagnosis not present

## 2011-06-26 DIAGNOSIS — M1710 Unilateral primary osteoarthritis, unspecified knee: Secondary | ICD-10-CM | POA: Diagnosis not present

## 2011-06-26 DIAGNOSIS — L851 Acquired keratosis [keratoderma] palmaris et plantaris: Secondary | ICD-10-CM | POA: Diagnosis not present

## 2011-06-27 ENCOUNTER — Other Ambulatory Visit (HOSPITAL_COMMUNITY): Payer: Self-pay | Admitting: Orthopedic Surgery

## 2011-06-27 NOTE — H&P (Signed)
[name], [name]                ACCOUNT NO.:  [id number]  MEDICAL RECORD NO.:  [id number]  LOCATION:                                 FACILITY:  PHYSICIAN:  Burnard Bunting, M.D.    DATE OF BIRTH:  1951/04/20  DATE OF ADMISSION: DATE OF DISCHARGE:                             HISTORY & PHYSICAL   CHIEF COMPLAINT:  Right knee pain.  HISTORY OF PRESENT ILLNESS:  [name] is a 61 year old patient with right knee pain.  Knee pain started a couple of months ago.  His old notes were reviewed.  He has had viscosupplementation into the right knee with good relief, but now he has continued pain, which is worsening, interfering with his ADLs, interfering with walking and resting.  He had a left total knee replacement done in 1999.  Did not have diagnosis of diabetes at that time.  Did have diagnosis of HIV at that time.  He has been doing well in terms of his medical management.  His last hemoglobin A1c was 4.8.  His last CD4 count was 675.  The patient is using a cane to help him with ambulation, has 4 steps at home.  Does have someone who can come and stay with him some of the time, but no one living there full time.  Past family and social history is updated and unchanged.  No interval hospitalizations or surgeries.  __________.  All systems reviewed are negative as they relate to the right knee.  PHYSICAL EXAMINATION:  GENERAL:  He is well developed, well nourished, no acute distress.  Alert, oriented.  He does have a bit of a hunch back from his prior back surgery.  He did well with that. CHEST:  Clear to auscultation. HEART:  Heart beats with regular rate and rhythm. ABDOMEN:  Benign. EXTREMITIES:  Right knee demonstrates palpable pedal pulses.  Range of motion to about 100 degrees of flexion.  He does have full extension. Patella tracks full laterally. He has medial and lateral joint line tenderness.  No other masses or other skin changes noted in the right knee  region.  IMPRESSION:  Right knee arthritis, refractory and nonoperative management with pretty severe pain.  He has pain with ADLs, pain which is keeping him from walking as much as he wants.  He has exhausted nonoperative therapy.  He is at a higher risk than usual for total knee replacement due to his diabetes and the another other medical problem. His risk of infection is higher due to the hiv as well as of prior surgeries.  Nonetheless, I do not think that medically speaking, he will be needing more optimized later then he is right now.  The patient does desire total knee replacement based on end-stage tricompartmental arthritis and symptoms refractory to nonoperative management with daily pain.  He is at high risk, this will complicate medical decision making. The risks and benefits of surgery were discussed with the patient, including but not limited to, infection, nerve vessel damage, knee stiffness, incomplete pain relief, deep vein thrombosis, and death. No family history of pulmonary embolism or deep vein thrombosis in this patient.  The patient understands the risks and  benefits, and wished to proceed.  All questions answered.     Burnard Bunting, M.D.     GSD/MEDQ  D:  06/26/2011  T:  06/27/2011  Job:  [id number]

## 2011-07-01 DIAGNOSIS — E785 Hyperlipidemia, unspecified: Secondary | ICD-10-CM | POA: Diagnosis not present

## 2011-07-01 DIAGNOSIS — Z79899 Other long term (current) drug therapy: Secondary | ICD-10-CM | POA: Diagnosis not present

## 2011-07-08 ENCOUNTER — Encounter (HOSPITAL_COMMUNITY): Payer: Self-pay | Admitting: Pharmacy Technician

## 2011-07-09 ENCOUNTER — Telehealth: Payer: Self-pay | Admitting: *Deleted

## 2011-07-09 NOTE — Telephone Encounter (Signed)
Left message for patient to call office.  

## 2011-07-09 NOTE — Telephone Encounter (Signed)
Message copied by Eustace Moore on Wed Jul 09, 2011  2:26 PM ------      Message from: Lorine Bears A      Created: Fri Jul 04, 2011  4:30 PM       His cholesterol is fine. However, his bilirubin is high. I don't think that is related to Lipitor. It might be related to his other medications. Fax a copy of labs to his PCP or whoever is treating his HIV and ask him to follow up with them about this issue.

## 2011-07-10 NOTE — Telephone Encounter (Signed)
Patient informed and ID MD and PCP will be notified of results.

## 2011-07-11 ENCOUNTER — Other Ambulatory Visit: Payer: Self-pay

## 2011-07-11 ENCOUNTER — Encounter (HOSPITAL_COMMUNITY)
Admission: RE | Admit: 2011-07-11 | Discharge: 2011-07-11 | Disposition: A | Discharge status: Home / Self Care | Payer: Medicare Other | Source: Ambulatory Visit | Attending: Orthopedic Surgery | Admitting: Orthopedic Surgery

## 2011-07-11 ENCOUNTER — Encounter (HOSPITAL_COMMUNITY): Payer: Self-pay

## 2011-07-11 DIAGNOSIS — Z8639 Personal history of other endocrine, nutritional and metabolic disease: Secondary | ICD-10-CM | POA: Diagnosis not present

## 2011-07-11 DIAGNOSIS — Z01811 Encounter for preprocedural respiratory examination: Secondary | ICD-10-CM | POA: Diagnosis not present

## 2011-07-11 DIAGNOSIS — I10 Essential (primary) hypertension: Secondary | ICD-10-CM | POA: Diagnosis not present

## 2011-07-11 DIAGNOSIS — I2510 Atherosclerotic heart disease of native coronary artery without angina pectoris: Secondary | ICD-10-CM | POA: Diagnosis not present

## 2011-07-11 DIAGNOSIS — J8410 Pulmonary fibrosis, unspecified: Secondary | ICD-10-CM | POA: Diagnosis not present

## 2011-07-11 DIAGNOSIS — M1710 Unilateral primary osteoarthritis, unspecified knee: Secondary | ICD-10-CM | POA: Diagnosis not present

## 2011-07-11 DIAGNOSIS — E119 Type 2 diabetes mellitus without complications: Secondary | ICD-10-CM | POA: Diagnosis not present

## 2011-07-11 HISTORY — DX: Personal history of other diseases of the digestive system: Z87.19

## 2011-07-11 HISTORY — DX: Cerebral infarction, unspecified: I63.9

## 2011-07-11 LAB — SURGICAL PCR SCREEN
MRSA, PCR: NEGATIVE
Staphylococcus aureus: NEGATIVE

## 2011-07-11 LAB — APTT: aPTT: 37 seconds (ref 24–37)

## 2011-07-11 LAB — CBC
HCT: 40 % (ref 39.0–52.0)
Hemoglobin: 13.9 g/dL (ref 13.0–17.0)
MCH: 38.9 pg — ABNORMAL HIGH (ref 26.0–34.0)
MCHC: 34.8 g/dL (ref 30.0–36.0)
MCV: 112 fL — ABNORMAL HIGH (ref 78.0–100.0)
Platelets: 151 10*3/uL (ref 150–400)
RBC: 3.57 MIL/uL — ABNORMAL LOW (ref 4.22–5.81)
RDW: 14.3 % (ref 11.5–15.5)
WBC: 5.6 10*3/uL (ref 4.0–10.5)

## 2011-07-11 LAB — BASIC METABOLIC PANEL
BUN: 17 mg/dL (ref 6–23)
CO2: 30 mEq/L (ref 19–32)
Calcium: 9.2 mg/dL (ref 8.4–10.5)
Chloride: 102 mEq/L (ref 96–112)
Creatinine, Ser: 0.77 mg/dL (ref 0.50–1.35)
GFR calc Af Amer: >90 mL/min (ref >90)
GFR calc non Af Amer: >90 mL/min (ref >90)
Glucose, Bld: 68 mg/dL — ABNORMAL LOW (ref 70–99)
Potassium: 3.6 mEq/L (ref 3.5–5.1)
Sodium: 139 mEq/L (ref 135–145)

## 2011-07-11 LAB — URINALYSIS, ROUTINE W REFLEX MICROSCOPIC
Bilirubin Urine: NEGATIVE
Glucose, UA: NEGATIVE mg/dL
Hgb urine dipstick: NEGATIVE
Ketones, ur: NEGATIVE mg/dL
Leukocytes, UA: NEGATIVE
Nitrite: NEGATIVE
Protein, ur: NEGATIVE mg/dL
Specific Gravity, Urine: 1.014 (ref 1.005–1.030)
Urobilinogen, UA: 1 mg/dL (ref 0.0–1.0)
pH: 6.5 (ref 5.0–8.0)

## 2011-07-11 LAB — TYPE AND SCREEN
ABO/RH(D): A POS
Antibody Screen: NEGATIVE

## 2011-07-11 LAB — PROTIME-INR
INR: 2.71 — ABNORMAL HIGH (ref 0.00–1.49)
Prothrombin Time: 29.2 seconds — ABNORMAL HIGH (ref 11.6–15.2)

## 2011-07-11 MED ORDER — CHLORHEXIDINE GLUCONATE 4 % EX LIQD: 60.0000 mL | Freq: Once | CUTANEOUS | Status: DC

## 2011-07-11 NOTE — Pre-Procedure Instructions (Addendum)
20 JEROLD YOSS  07/11/2011   Your procedure is scheduled on:  07/22/11  Report to [facility] at 530 AM.  Call this number if you have problems the morning of surgery: [phone]   Remember:   Do not eat food:After Midnight.  May have clear liquids: up to 4 Hours before arrival.130 am  Clear liquids include soda, tea, black coffee, apple or grape juice, broth.  Take these medicines the morning of surgery with A SIP OF WATER: metoprolol, colchicine,lyrica,zoloft hiv meds if take in am  STOP  Coumadin, aspirin , methotrexate on 7th of march   Do not wear jewelry, make-up or nail polish.  Do not wear lotions, powders, or perfumes. You may wear deodorant.  Do not shave 48 hours prior to surgery.  Do not bring valuables to the hospital.  Contacts, dentures or bridgework may not be worn into surgery.  Leave suitcase in the car. After surgery it may be brought to your room.  For patients admitted to the hospital, checkout time is 11:00 AM the day of discharge.   Patients discharged the day of surgery will not be allowed to drive home.  Name and phone number of your driver:tricia fritz [phone]  Special Instructions: CHG Shower Use Special Wash: 1/2 bottle night before surgery and 1/2 bottle morning of surgery.   Please read over the following fact sheets that you were given: Pain Booklet, Coughing and Deep Breathing, Blood Transfusion Information, Total Joint Packet, MRSA Information and Surgical Site Infection Prevention

## 2011-07-11 NOTE — Progress Notes (Signed)
Note from dr Rachel Moulds Murdock morehead  In epic, stress 05/18/09, echo 10/03/08

## 2011-07-12 LAB — URINE CULTURE
Colony Count: NO GROWTH
Culture  Setup Time: 201303011132
Culture: NO GROWTH

## 2011-07-15 ENCOUNTER — Telehealth: Payer: Self-pay | Admitting: *Deleted

## 2011-07-15 DIAGNOSIS — Z13820 Encounter for screening for osteoporosis: Secondary | ICD-10-CM | POA: Diagnosis not present

## 2011-07-15 DIAGNOSIS — N400 Enlarged prostate without lower urinary tract symptoms: Secondary | ICD-10-CM | POA: Diagnosis not present

## 2011-07-15 DIAGNOSIS — M069 Rheumatoid arthritis, unspecified: Secondary | ICD-10-CM | POA: Diagnosis not present

## 2011-07-15 DIAGNOSIS — M159 Polyosteoarthritis, unspecified: Secondary | ICD-10-CM | POA: Diagnosis not present

## 2011-07-15 DIAGNOSIS — Z79899 Other long term (current) drug therapy: Secondary | ICD-10-CM | POA: Diagnosis not present

## 2011-07-15 DIAGNOSIS — N433 Hydrocele, unspecified: Secondary | ICD-10-CM | POA: Diagnosis not present

## 2011-07-15 NOTE — Telephone Encounter (Signed)
[name] from [facility] called, patient is scheduled for knee replacement surgery on 07/22/11. Looking for clearance from ID for this. Patient's next appointment with Dr. Luciana Axe is 08/07/11.  Asked the orthopedic to fax over a request for clearance and will ask Dr. Luciana Axe if he will approve. Wendall Mola CMA

## 2011-07-15 NOTE — Consult Note (Signed)
Anesthesia:  Patient is a 61 year old male scheduled for right TKA on 07/22/11.  History includes HIV (on valacyclovir, ritonavir, raltegravir, combivir, atazanavir), HTN, arthritis, CAD/MI s/p CABG 2010, CVA, HH, recurrent DVT LLE on Coumadin, HLD, DM2 (not requiring meds), chronic lymphocytic leukemia, RA, non-smoker, bilateral 0-39% ICA stenosis by carotid duplex on 06/09/11.  His Hematologist is Dr. Latanya Presser at [facility].  His HIV is followed by Dr. Staci Righter with [facility].  According to notes in Epic, Dr. Diamantina Providence office is faxing a request for clearance to Dr. Luciana Axe today.  (I've asked the Short Stay staff to follow-up on this.)  His primary Cardiologist is Dr. Lorine Bears Adolph Pollack). He was just seen on 05/20/11 and was not having any ischemic symptoms and continued medical therapy was recommended.  His last cath on 09/23/08 was prior to his CABG X 3 (LIMA to LAD, left RA to CX marginal, right RA to distal RCA) and showed 3V CAD, EF 40-45%.  Echo on 10/03/08 showed: 1. Left ventricle: The cavity size was normal. Wall thickness was normal. The estimated ejection fraction was 65%. Wall motion was normal; there were no regional wall motion abnormalities. 2. Aortic valve: Mildly thickened leaflets. Cusp separation was normal. 3. Right atrium: The atrium was mildly dilated.  EKG on 07/11/11 showed NSR, possible LAE.    CXR from 07/11/11 showed no active disease.  Labs noted.  He will need a repeat PT/INR.  With his history of HIV, I'll also order an HFP (AST/ALT were WNL in November).

## 2011-07-16 ENCOUNTER — Other Ambulatory Visit: Payer: Self-pay | Admitting: Internal Medicine

## 2011-07-16 ENCOUNTER — Telehealth: Payer: Self-pay | Admitting: *Deleted

## 2011-07-16 DIAGNOSIS — Z113 Encounter for screening for infections with a predominantly sexual mode of transmission: Secondary | ICD-10-CM

## 2011-07-16 NOTE — Telephone Encounter (Signed)
He is very concerned about getting a letter of clearance for his knee surgery. States he has help linded up & other arrangments made. He needs md to write it today or at the very least have our md call the surgeon. I told him Dr. Luciana Axe is not here but I will send him a message & get back with him when I have a reponse

## 2011-07-16 NOTE — Telephone Encounter (Signed)
There is no infectious disease or HIV contraindication to surgery.  Thanks

## 2011-07-16 NOTE — Telephone Encounter (Signed)
I faxed this to his surgeon's office. Pt informed that this has been done

## 2011-07-17 ENCOUNTER — Other Ambulatory Visit: Payer: Medicare Other

## 2011-07-17 ENCOUNTER — Telehealth: Payer: Self-pay | Admitting: *Deleted

## 2011-07-17 NOTE — Telephone Encounter (Signed)
Sherry with Dr. August Saucer at [facility] left message regarding surgical clearance for pt.   Cordelia Pen notified this has not been reviewed by Dr. Kirke Corin as he has not been in the office since Monday.

## 2011-07-17 NOTE — Telephone Encounter (Signed)
Called and left patient a voice mail to call and reschedule his lab appointment.  Explained on voice mail if he does not reschedule by 07/21/11 his appointment with Dr. Luciana Axe for 08/07/11 may need to be rescheduled also. Wendall Mola CMA

## 2011-07-18 ENCOUNTER — Other Ambulatory Visit: Payer: Medicare Other

## 2011-07-18 DIAGNOSIS — Z113 Encounter for screening for infections with a predominantly sexual mode of transmission: Secondary | ICD-10-CM | POA: Diagnosis not present

## 2011-07-18 DIAGNOSIS — B20 Human immunodeficiency virus [HIV] disease: Secondary | ICD-10-CM | POA: Diagnosis not present

## 2011-07-18 LAB — COMPLETE METABOLIC PANEL WITH GFR
ALT: 32 U/L (ref 0–53)
AST: 41 U/L — ABNORMAL HIGH (ref 0–37)
Albumin: 4.4 g/dL (ref 3.5–5.2)
Alkaline Phosphatase: 118 U/L — ABNORMAL HIGH (ref 39–117)
BUN: 14 mg/dL (ref 6–23)
CO2: 28 mEq/L (ref 19–32)
Calcium: 9.2 mg/dL (ref 8.4–10.5)
Chloride: 100 mEq/L (ref 96–112)
Creat: 0.88 mg/dL (ref 0.50–1.35)
GFR, Est African American: >89 mL/min
GFR, Est Non African American: >89 mL/min
Glucose, Bld: 69 mg/dL — ABNORMAL LOW (ref 70–99)
Potassium: 4.2 mEq/L (ref 3.5–5.3)
Sodium: 137 mEq/L (ref 135–145)
Total Bilirubin: 7.1 mg/dL — ABNORMAL HIGH (ref 0.3–1.2)
Total Protein: 6 g/dL (ref 6.0–8.3)

## 2011-07-18 LAB — CBC WITH DIFFERENTIAL/PLATELET
Basophils Absolute: 0 10*3/uL (ref 0.0–0.1)
Basophils Relative: 0 % (ref 0–1)
Eosinophils Absolute: 0.1 10*3/uL (ref 0.0–0.7)
Eosinophils Relative: 1 % (ref 0–5)
HCT: 41.3 % (ref 39.0–52.0)
Hemoglobin: 13.8 g/dL (ref 13.0–17.0)
Lymphocytes Relative: 61 % — ABNORMAL HIGH (ref 12–46)
Lymphs Abs: 3.6 10*3/uL (ref 0.7–4.0)
MCH: 37.9 pg — ABNORMAL HIGH (ref 26.0–34.0)
MCHC: 33.4 g/dL (ref 30.0–36.0)
MCV: 113.5 fL — ABNORMAL HIGH (ref 78.0–100.0)
Monocytes Absolute: 0.7 10*3/uL (ref 0.1–1.0)
Monocytes Relative: 11 % (ref 3–12)
Neutro Abs: 1.6 10*3/uL — ABNORMAL LOW (ref 1.7–7.7)
Neutrophils Relative %: 26 % — ABNORMAL LOW (ref 43–77)
Platelets: 151 10*3/uL (ref 150–400)
RBC: 3.64 MIL/uL — ABNORMAL LOW (ref 4.22–5.81)
RDW: 14 % (ref 11.5–15.5)
WBC: 5.9 10*3/uL (ref 4.0–10.5)

## 2011-07-18 LAB — T-HELPER CELL (CD4) - (RCID CLINIC ONLY)
CD4 % Helper T Cell: 12 % — ABNORMAL LOW (ref 33–55)
CD4 T Cell Abs: 450 uL (ref 400–2700)

## 2011-07-18 LAB — RPR

## 2011-07-21 MED ORDER — CLINDAMYCIN PHOSPHATE 600 MG/50ML IV SOLN
600.0000 mg | INTRAVENOUS | Status: AC
  Administered 2011-07-22: 600 mg via INTRAVENOUS
  Filled 2011-07-21: qty 50

## 2011-07-22 ENCOUNTER — Ambulatory Visit (HOSPITAL_COMMUNITY): Payer: Medicare Other | Admitting: Vascular Surgery

## 2011-07-22 ENCOUNTER — Inpatient Hospital Stay (HOSPITAL_COMMUNITY)
Admission: RE | Admit: 2011-07-22 | Discharge: 2011-07-27 | DRG: 470 | Disposition: A | Discharge status: Home / Self Care | Payer: Medicare Other | Source: Ambulatory Visit | Attending: Orthopedic Surgery | Admitting: Orthopedic Surgery

## 2011-07-22 ENCOUNTER — Inpatient Hospital Stay (HOSPITAL_COMMUNITY): Payer: Medicare Other

## 2011-07-22 ENCOUNTER — Encounter (HOSPITAL_COMMUNITY): Payer: Self-pay | Admitting: Vascular Surgery

## 2011-07-22 ENCOUNTER — Encounter (HOSPITAL_COMMUNITY): Payer: Self-pay | Admitting: *Deleted

## 2011-07-22 ENCOUNTER — Encounter (HOSPITAL_COMMUNITY)
Admission: RE | Disposition: A | Discharge status: Home / Self Care | Payer: Self-pay | Source: Ambulatory Visit | Attending: Orthopedic Surgery

## 2011-07-22 DIAGNOSIS — Z886 Allergy status to analgesic agent status: Secondary | ICD-10-CM | POA: Diagnosis not present

## 2011-07-22 DIAGNOSIS — I2510 Atherosclerotic heart disease of native coronary artery without angina pectoris: Secondary | ICD-10-CM | POA: Diagnosis not present

## 2011-07-22 DIAGNOSIS — Z86718 Personal history of other venous thrombosis and embolism: Secondary | ICD-10-CM

## 2011-07-22 DIAGNOSIS — I252 Old myocardial infarction: Secondary | ICD-10-CM

## 2011-07-22 DIAGNOSIS — Z91018 Allergy to other foods: Secondary | ICD-10-CM | POA: Diagnosis not present

## 2011-07-22 DIAGNOSIS — Z888 Allergy status to other drugs, medicaments and biological substances status: Secondary | ICD-10-CM | POA: Diagnosis not present

## 2011-07-22 DIAGNOSIS — M25569 Pain in unspecified knee: Secondary | ICD-10-CM | POA: Diagnosis not present

## 2011-07-22 DIAGNOSIS — I10 Essential (primary) hypertension: Secondary | ICD-10-CM | POA: Diagnosis not present

## 2011-07-22 DIAGNOSIS — Z856 Personal history of leukemia: Secondary | ICD-10-CM

## 2011-07-22 DIAGNOSIS — E785 Hyperlipidemia, unspecified: Secondary | ICD-10-CM | POA: Diagnosis present

## 2011-07-22 DIAGNOSIS — B20 Human immunodeficiency virus [HIV] disease: Secondary | ICD-10-CM | POA: Diagnosis not present

## 2011-07-22 DIAGNOSIS — Z951 Presence of aortocoronary bypass graft: Secondary | ICD-10-CM | POA: Diagnosis not present

## 2011-07-22 DIAGNOSIS — H539 Unspecified visual disturbance: Secondary | ICD-10-CM | POA: Diagnosis not present

## 2011-07-22 DIAGNOSIS — Z79899 Other long term (current) drug therapy: Secondary | ICD-10-CM

## 2011-07-22 DIAGNOSIS — Z21 Asymptomatic human immunodeficiency virus [HIV] infection status: Secondary | ICD-10-CM | POA: Diagnosis present

## 2011-07-22 DIAGNOSIS — M1710 Unilateral primary osteoarthritis, unspecified knee: Secondary | ICD-10-CM | POA: Diagnosis not present

## 2011-07-22 DIAGNOSIS — Z8673 Personal history of transient ischemic attack (TIA), and cerebral infarction without residual deficits: Secondary | ICD-10-CM | POA: Diagnosis not present

## 2011-07-22 DIAGNOSIS — Z471 Aftercare following joint replacement surgery: Secondary | ICD-10-CM | POA: Diagnosis not present

## 2011-07-22 DIAGNOSIS — Z96649 Presence of unspecified artificial hip joint: Secondary | ICD-10-CM | POA: Diagnosis not present

## 2011-07-22 DIAGNOSIS — E119 Type 2 diabetes mellitus without complications: Secondary | ICD-10-CM | POA: Diagnosis not present

## 2011-07-22 DIAGNOSIS — M1711 Unilateral primary osteoarthritis, right knee: Secondary | ICD-10-CM

## 2011-07-22 HISTORY — PX: KNEE ARTHROPLASTY: SHX992

## 2011-07-22 LAB — GLUCOSE, CAPILLARY
Glucose-Capillary: 114 mg/dL — ABNORMAL HIGH (ref 70–99)
Glucose-Capillary: 188 mg/dL — ABNORMAL HIGH (ref 70–99)
Glucose-Capillary: 173 mg/dL — ABNORMAL HIGH (ref 70–99)

## 2011-07-22 LAB — PROTIME-INR
INR: 1.01 (ref 0.00–1.49)
Prothrombin Time: 13.5 seconds (ref 11.6–15.2)

## 2011-07-22 LAB — HEPATIC FUNCTION PANEL
ALT: 61 U/L — ABNORMAL HIGH (ref 0–53)
AST: 70 U/L — ABNORMAL HIGH (ref 0–37)
Albumin: 4 g/dL (ref 3.5–5.2)
Alkaline Phosphatase: 115 U/L (ref 39–117)
Bilirubin, Direct: 0.2 mg/dL (ref 0.0–0.3)
Indirect Bilirubin: 3.2 mg/dL — ABNORMAL HIGH (ref 0.3–0.9)
Total Bilirubin: 3.4 mg/dL — ABNORMAL HIGH (ref 0.3–1.2)
Total Protein: 6.3 g/dL (ref 6.0–8.3)

## 2011-07-22 LAB — HIV-1 RNA QUANT-NO REFLEX-BLD
HIV 1 RNA Quant: 24 copies/mL — ABNORMAL HIGH (ref <20)
HIV-1 RNA Quant, Log: 1.38 {Log} — ABNORMAL HIGH (ref <1.30)

## 2011-07-22 SURGERY — ARTHROPLASTY, KNEE, TOTAL, USING IMAGELESS COMPUTER-ASSISTED NAVIGATION
Anesthesia: General | Site: Knee | Laterality: Right | Wound class: Clean

## 2011-07-22 MED ORDER — SERTRALINE HCL 50 MG PO TABS
50.0000 mg | ORAL_TABLET | Freq: Every day | ORAL | Status: DC
  Administered 2011-07-23 – 2011-07-27 (×5): 50 mg via ORAL
  Filled 2011-07-22 (×6): qty 1

## 2011-07-22 MED ORDER — DIPHENHYDRAMINE HCL 12.5 MG/5ML PO ELIX: 12.5000 mg | ORAL_SOLUTION | Freq: Four times a day (QID) | ORAL | Status: DC | PRN

## 2011-07-22 MED ORDER — NEOSTIGMINE METHYLSULFATE 1 MG/ML IJ SOLN
INTRAMUSCULAR | Status: DC | PRN
  Administered 2011-07-22: 3 mg via INTRAVENOUS

## 2011-07-22 MED ORDER — DEXAMETHASONE SODIUM PHOSPHATE 10 MG/ML IJ SOLN
INTRAMUSCULAR | Status: DC | PRN
  Administered 2011-07-22: 8 mg via INTRAVENOUS

## 2011-07-22 MED ORDER — 0.9 % SODIUM CHLORIDE (POUR BTL) OPTIME
TOPICAL | Status: DC | PRN
  Administered 2011-07-22: 3000 mL
  Administered 2011-07-22: 1000 mL

## 2011-07-22 MED ORDER — ACETAMINOPHEN 10 MG/ML IV SOLN
INTRAVENOUS | Status: AC
  Filled 2011-07-22: qty 100

## 2011-07-22 MED ORDER — BIOTENE DRY MOUTH MT LIQD
15.0000 mL | Freq: Two times a day (BID) | OROMUCOSAL | Status: DC
  Administered 2011-07-22 – 2011-07-27 (×5): 15 mL via OROMUCOSAL

## 2011-07-22 MED ORDER — PHENOL 1.4 % MT LIQD: 1.0000 | OROMUCOSAL | Status: DC | PRN

## 2011-07-22 MED ORDER — VALACYCLOVIR HCL 500 MG PO TABS
500.0000 mg | ORAL_TABLET | Freq: Every day | ORAL | Status: DC
  Administered 2011-07-22 – 2011-07-27 (×6): 500 mg via ORAL
  Filled 2011-07-22 (×7): qty 1

## 2011-07-22 MED ORDER — FLEET ENEMA 7-19 GM/118ML RE ENEM: 1.0000 | ENEMA | Freq: Once | RECTAL | Status: AC | PRN

## 2011-07-22 MED ORDER — ATAZANAVIR SULFATE 150 MG PO CAPS
300.0000 mg | ORAL_CAPSULE | Freq: Every day | ORAL | Status: DC
  Administered 2011-07-23 – 2011-07-27 (×5): 300 mg via ORAL
  Filled 2011-07-22 (×6): qty 2

## 2011-07-22 MED ORDER — CALCIUM CARBONATE 1250 (500 CA) MG PO TABS
2.0000 | ORAL_TABLET | Freq: Every day | ORAL | Status: DC
  Administered 2011-07-22 – 2011-07-27 (×6): 1000 mg via ORAL
  Filled 2011-07-22 (×6): qty 2

## 2011-07-22 MED ORDER — ONDANSETRON HCL 4 MG/2ML IJ SOLN
INTRAMUSCULAR | Status: DC | PRN
  Administered 2011-07-22: 4 mg via INTRAVENOUS

## 2011-07-22 MED ORDER — HYDROMORPHONE 0.3 MG/ML IV SOLN
INTRAVENOUS | Status: DC
  Administered 2011-07-22: 0.3 mg via INTRAVENOUS
  Administered 2011-07-22: 11:00:00 via INTRAVENOUS
  Administered 2011-07-23: 0.9 mg via INTRAVENOUS
  Administered 2011-07-23: 1.2 mg via INTRAVENOUS
  Administered 2011-07-23: 1.5 mg via INTRAVENOUS
  Administered 2011-07-23: 0.3 mg via INTRAVENOUS

## 2011-07-22 MED ORDER — INSULIN ASPART 100 UNIT/ML ~~LOC~~ SOLN: 0.0000 [IU] | Freq: Three times a day (TID) | SUBCUTANEOUS | Status: DC

## 2011-07-22 MED ORDER — ROCURONIUM BROMIDE 100 MG/10ML IV SOLN
INTRAVENOUS | Status: DC | PRN
  Administered 2011-07-22 (×2): 10 mg via INTRAVENOUS
  Administered 2011-07-22: 50 mg via INTRAVENOUS
  Administered 2011-07-22: 10 mg via INTRAVENOUS

## 2011-07-22 MED ORDER — MENTHOL 3 MG MT LOZG
1.0000 | LOZENGE | OROMUCOSAL | Status: DC | PRN
  Filled 2011-07-22: qty 9

## 2011-07-22 MED ORDER — ATORVASTATIN CALCIUM 10 MG PO TABS
10.0000 mg | ORAL_TABLET | Freq: Every day | ORAL | Status: DC
  Administered 2011-07-22 – 2011-07-26 (×5): 10 mg via ORAL
  Filled 2011-07-22 (×6): qty 1

## 2011-07-22 MED ORDER — ACETAMINOPHEN 650 MG RE SUPP: 650.0000 mg | Freq: Four times a day (QID) | RECTAL | Status: DC | PRN

## 2011-07-22 MED ORDER — ACETAMINOPHEN 10 MG/ML IV SOLN
INTRAVENOUS | Status: DC | PRN
  Administered 2011-07-22: 1000 mg via INTRAVENOUS

## 2011-07-22 MED ORDER — POTASSIUM CHLORIDE IN NACL 20-0.9 MEQ/L-% IV SOLN
INTRAVENOUS | Status: DC
  Administered 2011-07-22 – 2011-07-23 (×3): via INTRAVENOUS
  Filled 2011-07-22 (×11): qty 1000

## 2011-07-22 MED ORDER — FENTANYL CITRATE 0.05 MG/ML IJ SOLN: 25.0000 ug | INTRAMUSCULAR | Status: DC | PRN

## 2011-07-22 MED ORDER — NALOXONE HCL 0.4 MG/ML IJ SOLN: 0.4000 mg | INTRAMUSCULAR | Status: DC | PRN

## 2011-07-22 MED ORDER — BUPIVACAINE-EPINEPHRINE 0.5% -1:200000 IJ SOLN
INTRAMUSCULAR | Status: DC | PRN
  Administered 2011-07-22: 20 mL

## 2011-07-22 MED ORDER — DOCUSATE SODIUM 100 MG PO CAPS
100.0000 mg | ORAL_CAPSULE | Freq: Two times a day (BID) | ORAL | Status: DC
  Administered 2011-07-23 – 2011-07-27 (×9): 100 mg via ORAL
  Filled 2011-07-22 (×11): qty 1

## 2011-07-22 MED ORDER — METHOCARBAMOL 100 MG/ML IJ SOLN
500.0000 mg | Freq: Four times a day (QID) | INTRAVENOUS | Status: DC | PRN
  Filled 2011-07-22: qty 5

## 2011-07-22 MED ORDER — VITAMIN D3 25 MCG (1000 UNIT) PO TABS
1000.0000 [IU] | ORAL_TABLET | Freq: Every day | ORAL | Status: DC
  Administered 2011-07-22 – 2011-07-27 (×6): 1000 [IU] via ORAL
  Filled 2011-07-22 (×6): qty 1

## 2011-07-22 MED ORDER — PHENYLEPHRINE HCL 10 MG/ML IJ SOLN
INTRAMUSCULAR | Status: DC | PRN
  Administered 2011-07-22 (×5): 40 ug via INTRAVENOUS
  Administered 2011-07-22: 80 ug via INTRAVENOUS
  Administered 2011-07-22 (×3): 40 ug via INTRAVENOUS

## 2011-07-22 MED ORDER — WARFARIN - PHARMACIST DOSING INPATIENT: Freq: Every day | Status: DC

## 2011-07-22 MED ORDER — ONDANSETRON HCL 4 MG/2ML IJ SOLN: 4.0000 mg | Freq: Four times a day (QID) | INTRAMUSCULAR | Status: DC | PRN

## 2011-07-22 MED ORDER — ACETAMINOPHEN 325 MG PO TABS: 325.0000 mg | ORAL_TABLET | ORAL | Status: DC | PRN

## 2011-07-22 MED ORDER — CLINDAMYCIN PHOSPHATE 600 MG/50ML IV SOLN
600.0000 mg | Freq: Three times a day (TID) | INTRAVENOUS | Status: AC
  Administered 2011-07-22 – 2011-07-23 (×3): 600 mg via INTRAVENOUS
  Filled 2011-07-22 (×3): qty 50

## 2011-07-22 MED ORDER — GLYCOPYRROLATE 0.2 MG/ML IJ SOLN
INTRAMUSCULAR | Status: DC | PRN
  Administered 2011-07-22: 0.6 mg via INTRAVENOUS

## 2011-07-22 MED ORDER — WARFARIN SODIUM 10 MG PO TABS
12.5000 mg | ORAL_TABLET | Freq: Once | ORAL | Status: AC
  Administered 2011-07-22: 12.5 mg via ORAL
  Filled 2011-07-22: qty 1

## 2011-07-22 MED ORDER — PREGABALIN 75 MG PO CAPS
200.0000 mg | ORAL_CAPSULE | Freq: Two times a day (BID) | ORAL | Status: DC
  Administered 2011-07-22 – 2011-07-27 (×10): 200 mg via ORAL
  Filled 2011-07-22 (×3): qty 1
  Filled 2011-07-22: qty 4
  Filled 2011-07-22: qty 2
  Filled 2011-07-22 (×8): qty 1

## 2011-07-22 MED ORDER — DIPHENHYDRAMINE HCL 50 MG/ML IJ SOLN: 12.5000 mg | Freq: Four times a day (QID) | INTRAMUSCULAR | Status: DC | PRN

## 2011-07-22 MED ORDER — ACETAMINOPHEN 325 MG PO TABS: 650.0000 mg | ORAL_TABLET | Freq: Four times a day (QID) | ORAL | Status: DC | PRN

## 2011-07-22 MED ORDER — HYDROMORPHONE HCL 2 MG PO TABS
4.0000 mg | ORAL_TABLET | ORAL | Status: DC | PRN
  Administered 2011-07-24: 2 mg via ORAL
  Administered 2011-07-24: 4 mg via ORAL
  Administered 2011-07-25: 2 mg via ORAL
  Administered 2011-07-25 – 2011-07-26 (×2): 4 mg via ORAL
  Filled 2011-07-22 (×2): qty 2
  Filled 2011-07-22: qty 1
  Filled 2011-07-22: qty 2
  Filled 2011-07-22: qty 1

## 2011-07-22 MED ORDER — CALCIUM CARBONATE 600 MG PO TABS
1200.0000 mg | ORAL_TABLET | Freq: Every day | ORAL | Status: DC
  Filled 2011-07-22: qty 2

## 2011-07-22 MED ORDER — ONDANSETRON HCL 4 MG PO TABS: 4.0000 mg | ORAL_TABLET | Freq: Four times a day (QID) | ORAL | Status: DC | PRN

## 2011-07-22 MED ORDER — SENNOSIDES-DOCUSATE SODIUM 8.6-50 MG PO TABS: 1.0000 | ORAL_TABLET | Freq: Every evening | ORAL | Status: DC | PRN

## 2011-07-22 MED ORDER — THROMBIN 20000 UNITS EX KIT
PACK | CUTANEOUS | Status: DC | PRN
  Administered 2011-07-22: 20000 [IU] via TOPICAL

## 2011-07-22 MED ORDER — METHOCARBAMOL 500 MG PO TABS
500.0000 mg | ORAL_TABLET | Freq: Four times a day (QID) | ORAL | Status: DC | PRN
  Administered 2011-07-24: 500 mg via ORAL
  Filled 2011-07-22: qty 1

## 2011-07-22 MED ORDER — MIDAZOLAM HCL 5 MG/5ML IJ SOLN
INTRAMUSCULAR | Status: DC | PRN
  Administered 2011-07-22: 2 mg via INTRAVENOUS

## 2011-07-22 MED ORDER — CLONIDINE HCL (ANALGESIA) 100 MCG/ML EP SOLN
EPIDURAL | Status: DC | PRN
  Administered 2011-07-22: .09 mL

## 2011-07-22 MED ORDER — RITONAVIR 100 MG PO CAPS
100.0000 mg | ORAL_CAPSULE | Freq: Every day | ORAL | Status: DC
  Administered 2011-07-22 – 2011-07-26 (×5): 100 mg via ORAL
  Filled 2011-07-22 (×6): qty 1

## 2011-07-22 MED ORDER — FOLIC ACID 1 MG PO TABS
1.0000 mg | ORAL_TABLET | Freq: Every day | ORAL | Status: DC
  Administered 2011-07-22 – 2011-07-27 (×6): 1 mg via ORAL
  Filled 2011-07-22 (×6): qty 1

## 2011-07-22 MED ORDER — RALTEGRAVIR POTASSIUM 400 MG PO TABS
400.0000 mg | ORAL_TABLET | Freq: Two times a day (BID) | ORAL | Status: DC
  Administered 2011-07-22 – 2011-07-27 (×10): 400 mg via ORAL
  Filled 2011-07-22 (×12): qty 1

## 2011-07-22 MED ORDER — PROPOFOL 10 MG/ML IV EMUL
INTRAVENOUS | Status: DC | PRN
  Administered 2011-07-22: 200 mg via INTRAVENOUS

## 2011-07-22 MED ORDER — LAMIVUDINE-ZIDOVUDINE 150-300 MG PO TABS
1.0000 | ORAL_TABLET | Freq: Two times a day (BID) | ORAL | Status: DC
  Administered 2011-07-22 – 2011-07-27 (×10): 1 via ORAL
  Filled 2011-07-22 (×12): qty 1

## 2011-07-22 MED ORDER — LACTATED RINGERS IV SOLN
INTRAVENOUS | Status: DC | PRN
  Administered 2011-07-22 (×3): via INTRAVENOUS

## 2011-07-22 MED ORDER — ZOLPIDEM TARTRATE 5 MG PO TABS: 5.0000 mg | ORAL_TABLET | Freq: Every evening | ORAL | Status: DC | PRN

## 2011-07-22 MED ORDER — FENTANYL CITRATE 0.05 MG/ML IJ SOLN
INTRAMUSCULAR | Status: DC | PRN
  Administered 2011-07-22: 50 ug via INTRAVENOUS
  Administered 2011-07-22: 100 ug via INTRAVENOUS
  Administered 2011-07-22 (×2): 50 ug via INTRAVENOUS
  Administered 2011-07-22 (×2): 100 ug via INTRAVENOUS
  Administered 2011-07-22: 50 ug via INTRAVENOUS

## 2011-07-22 MED ORDER — SODIUM CHLORIDE 0.9 % IJ SOLN: 9.0000 mL | INTRAMUSCULAR | Status: DC | PRN

## 2011-07-22 MED ORDER — CLONIDINE HCL (ANALGESIA) 100 MCG/ML EP SOLN
150.0000 ug | EPIDURAL | Status: DC
  Filled 2011-07-22: qty 1.5

## 2011-07-22 MED ORDER — METOPROLOL SUCCINATE ER 25 MG PO TB24
25.0000 mg | ORAL_TABLET | Freq: Every day | ORAL | Status: DC
  Administered 2011-07-27: 25 mg via ORAL
  Filled 2011-07-22 (×6): qty 1

## 2011-07-22 MED ORDER — LIDOCAINE HCL (CARDIAC) 20 MG/ML IV SOLN
INTRAVENOUS | Status: DC | PRN
  Administered 2011-07-22: 50 mg via INTRAVENOUS

## 2011-07-22 MED ORDER — ENOXAPARIN SODIUM 30 MG/0.3ML ~~LOC~~ SOLN
30.0000 mg | Freq: Two times a day (BID) | SUBCUTANEOUS | Status: DC
  Administered 2011-07-23 – 2011-07-27 (×9): 30 mg via SUBCUTANEOUS
  Filled 2011-07-22 (×11): qty 0.3

## 2011-07-22 MED ORDER — BISACODYL 10 MG RE SUPP
10.0000 mg | Freq: Every day | RECTAL | Status: DC | PRN
  Administered 2011-07-25: 10 mg via RECTAL
  Filled 2011-07-22: qty 1

## 2011-07-22 MED ORDER — DIPHENHYDRAMINE HCL 12.5 MG/5ML PO ELIX: 12.5000 mg | ORAL_SOLUTION | ORAL | Status: DC | PRN

## 2011-07-22 SURGICAL SUPPLY — 77 items
BANDAGE ACE 4 STERILE (GAUZE/BANDAGES/DRESSINGS) ×2 IMPLANT
BANDAGE ELASTIC 4 VELCRO ST LF (GAUZE/BANDAGES/DRESSINGS) IMPLANT
BANDAGE ESMARK 6X9 LF (GAUZE/BANDAGES/DRESSINGS) ×1 IMPLANT
BLADE SAG 18X100X1.27 (BLADE) ×2 IMPLANT
BLADE SAW SGTL 13.0X1.19X90.0M (BLADE) ×2 IMPLANT
BNDG COHESIVE 6X5 TAN STRL LF (GAUZE/BANDAGES/DRESSINGS) ×4 IMPLANT
BNDG ELASTIC 6X10 VLCR STRL LF (GAUZE/BANDAGES/DRESSINGS) ×2 IMPLANT
BNDG ESMARK 6X9 LF (GAUZE/BANDAGES/DRESSINGS) ×2
BOWL SMART MIX CTS (DISPOSABLE) ×2 IMPLANT
CEMENT HV SMART SET (Cement) ×4 IMPLANT
CLOTH BEACON ORANGE TIMEOUT ST (SAFETY) ×2 IMPLANT
COVER BACK TABLE 24X17X13 BIG (DRAPES) IMPLANT
COVER SURGICAL LIGHT HANDLE (MISCELLANEOUS) ×2 IMPLANT
CUFF TOURNIQUET SINGLE 34IN LL (TOURNIQUET CUFF) ×2 IMPLANT
CUFF TOURNIQUET SINGLE 44IN (TOURNIQUET CUFF) IMPLANT
DRAPE INCISE IOBAN 66X45 STRL (DRAPES) ×2 IMPLANT
DRAPE ORTHO SPLIT 77X108 STRL (DRAPES) ×3
DRAPE PROXIMA HALF (DRAPES) IMPLANT
DRAPE SURG ORHT 6 SPLT 77X108 (DRAPES) ×3 IMPLANT
DRAPE U-SHAPE 47X51 STRL (DRAPES) ×2 IMPLANT
DRAPE X-RAY CASS 24X20 (DRAPES) IMPLANT
DRSG PAD ABDOMINAL 8X10 ST (GAUZE/BANDAGES/DRESSINGS) IMPLANT
DURAPREP 26ML APPLICATOR (WOUND CARE) ×4 IMPLANT
ELECT REM PT RETURN 9FT ADLT (ELECTROSURGICAL) ×2
ELECTRODE REM PT RTRN 9FT ADLT (ELECTROSURGICAL) ×1 IMPLANT
EVACUATOR 1/8 PVC DRAIN (DRAIN) ×2 IMPLANT
FACESHIELD LNG OPTICON STERILE (SAFETY) IMPLANT
FLUID NSS /IRRIG 3000 ML XXX (IV SOLUTION) ×2 IMPLANT
GAUZE XEROFORM 5X9 LF (GAUZE/BANDAGES/DRESSINGS) IMPLANT
GLOVE BIO SURGEON ST LM GN SZ9 (GLOVE) IMPLANT
GLOVE BIO SURGEON STRL SZ8 (GLOVE) ×4 IMPLANT
GLOVE BIOGEL PI IND STRL 8 (GLOVE) ×2 IMPLANT
GLOVE BIOGEL PI INDICATOR 8 (GLOVE) ×2
GLOVE SURG ORTHO 8.0 STRL STRW (GLOVE) ×2 IMPLANT
GLOVE SURG SS PI 7.5 STRL IVOR (GLOVE) ×8 IMPLANT
GOWN PREVENTION PLUS LG XLONG (DISPOSABLE) IMPLANT
GOWN PREVENTION PLUS XLARGE (GOWN DISPOSABLE) ×4 IMPLANT
GOWN STRL NON-REIN LRG LVL3 (GOWN DISPOSABLE) ×4 IMPLANT
GOWN STRL REIN XL XLG (GOWN DISPOSABLE) ×2 IMPLANT
HANDPIECE INTERPULSE COAX TIP (DISPOSABLE) ×1
HOOD PEEL AWAY FACE SHEILD DIS (HOOD) ×8 IMPLANT
IMMOBILIZER KNEE 20 (SOFTGOODS)
IMMOBILIZER KNEE 20 THIGH 36 (SOFTGOODS) IMPLANT
IMMOBILIZER KNEE 22 UNIV (SOFTGOODS) ×2 IMPLANT
IMMOBILIZER KNEE 24 THIGH 36 (MISCELLANEOUS) IMPLANT
IMMOBILIZER KNEE 24 UNIV (MISCELLANEOUS)
KIT BASIN OR (CUSTOM PROCEDURE TRAY) ×2 IMPLANT
KIT ROOM TURNOVER OR (KITS) ×2 IMPLANT
MANIFOLD NEPTUNE II (INSTRUMENTS) ×2 IMPLANT
MARKER SPHERE PSV REFLC THRD 5 (MARKER) ×6 IMPLANT
NEEDLE 18GX1X1/2 (RX/OR ONLY) (NEEDLE) IMPLANT
NEEDLE SPNL 18GX3.5 QUINCKE PK (NEEDLE) ×2 IMPLANT
NS IRRIG 1000ML POUR BTL (IV SOLUTION) ×2 IMPLANT
PACK TOTAL JOINT (CUSTOM PROCEDURE TRAY) ×2 IMPLANT
PAD ARMBOARD 7.5X6 YLW CONV (MISCELLANEOUS) ×4 IMPLANT
PAD CAST 4YDX4 CTTN HI CHSV (CAST SUPPLIES) IMPLANT
PADDING CAST COTTON 4X4 STRL (CAST SUPPLIES)
PADDING CAST COTTON 6X4 STRL (CAST SUPPLIES) ×2 IMPLANT
PIN SCHANZ 4MM 130MM (PIN) ×8 IMPLANT
RUBBERBAND STERILE (MISCELLANEOUS) ×2 IMPLANT
SET HNDPC FAN SPRY TIP SCT (DISPOSABLE) ×1 IMPLANT
SPONGE GAUZE 4X4 12PLY (GAUZE/BANDAGES/DRESSINGS) IMPLANT
SPONGE LAP 18X18 X RAY DECT (DISPOSABLE) ×2 IMPLANT
STAPLER VISISTAT 35W (STAPLE) ×2 IMPLANT
SUCTION FRAZIER TIP 10 FR DISP (SUCTIONS) ×2 IMPLANT
SUT ETHILON 3 0 PS 1 (SUTURE) ×4 IMPLANT
SUT VIC AB 0 CTB1 27 (SUTURE) ×4 IMPLANT
SUT VIC AB 1 CT1 27 (SUTURE) ×4
SUT VIC AB 1 CT1 27XBRD ANBCTR (SUTURE) ×4 IMPLANT
SUT VIC AB 2-0 CT1 27 (SUTURE) ×2
SUT VIC AB 2-0 CT1 TAPERPNT 27 (SUTURE) ×2 IMPLANT
SYR 30ML SLIP (SYRINGE) ×2 IMPLANT
SYR TB 1ML LUER SLIP (SYRINGE) ×2 IMPLANT
TOWEL OR 17X24 6PK STRL BLUE (TOWEL DISPOSABLE) ×2 IMPLANT
TOWEL OR 17X26 10 PK STRL BLUE (TOWEL DISPOSABLE) ×8 IMPLANT
TRAY FOLEY CATH 14FR (SET/KITS/TRAYS/PACK) ×2 IMPLANT
WATER STERILE IRR 1000ML POUR (IV SOLUTION) ×4 IMPLANT

## 2011-07-22 NOTE — Progress Notes (Signed)
Orthopedic Tech Progress Note Patient Details:  [name] 11-08-1950 [id number]  Patient ID: [name], male   DOB: 10-Dec-1950, 61 y.o.   MRN: [id number] Confirmed patient has knee immobilizer. Leo Grosser T 07/22/2011, 1:41 PM

## 2011-07-22 NOTE — Anesthesia Preprocedure Evaluation (Signed)
Anesthesia Evaluation  Patient identified by MRN, date of birth, ID band Patient awake    Reviewed: Allergy & Precautions, H&P , Patient's Chart, lab work & pertinent test results, reviewed documented beta blocker date and time   History of Anesthesia Complications Negative for: history of anesthetic complications  Airway Mallampati: II TM Distance: >3 FB Neck ROM: full    Dental No notable dental hx.    Pulmonary neg pulmonary ROS,  breath sounds clear to auscultation  Pulmonary exam normal       Cardiovascular Exercise Tolerance: Good hypertension, + CAD and + Past MI negative cardio ROS  Rhythm:regular Rate:Normal     Neuro/Psych CVA negative neurological ROS  negative psych ROS   GI/Hepatic negative GI ROS, Neg liver ROS, hiatal hernia,   Endo/Other  negative endocrine ROSDiabetes mellitus-  Renal/GU negative Renal ROS     Musculoskeletal  (+) Arthritis -,   Abdominal   Peds  Hematology negative hematology ROS (+)   Anesthesia Other Findings Hypertension     Leg DVT (deep venous thromboembolism), chronic        Arthritis     HIV infection        Carotid artery occlusion   40-60% right ICA stenosis (09/2008) Hyperlipidemia        Coronary artery disease   s/p CABG in 2010 Myocardial infarction        Stroke 97 tia Cancer   chll-chronic lymphocitic leukemia    H/O hiatal hernia 08 surgery Diabetes mellitus    Reproductive/Obstetrics negative OB ROS                           Anesthesia Physical Anesthesia Plan  ASA: III  Anesthesia Plan: General ETT   Post-op Pain Management:    Induction:   Airway Management Planned:   Additional Equipment:   Intra-op Plan:   Post-operative Plan:   Informed Consent: I have reviewed the patients History and Physical, chart, labs and discussed the procedure including the risks, benefits and alternatives for the proposed anesthesia with the  patient or authorized representative who has indicated his/her understanding and acceptance.   Dental Advisory Given  Plan Discussed with: CRNA and Surgeon  Anesthesia Plan Comments:         Anesthesia Quick Evaluation

## 2011-07-22 NOTE — Progress Notes (Signed)
ANTICOAGULATION CONSULT NOTE - Initial Consult  Pharmacy Consult for Coumadin Indication: VTE prophylaxis s/p R TKA  Allergies  Allergen Reactions  . Morphine     REACTION: severe headache  . Other Hives    Pecan  . Oxycodone-Acetaminophen     REACTION: headache  . Penicillins     REACTION: red, flushed  . Promethazine Hcl     REACTION: makes him feel drunk    Patient Measurements:   Ht: 69 inches Wt: 85 kg  Vital Signs: Temp: 98.2 F (36.8 C) (03/12 1230) Temp src: Oral (03/12 1230) BP: 105/62 mmHg (03/12 1230) Pulse Rate: 77  (03/12 1230)  Labs:  Basename 07/22/11 0608  HGB --  HCT --  PLT --  APTT --  LABPROT 13.5  INR 1.01  HEPARINUNFRC --  CREATININE --  CKTOTAL --  CKMB --  TROPONINI --   The CrCl is unknown because both a height and weight (above a minimum accepted value) are required for this calculation.  Medical History: Past Medical History  Diagnosis Date  . Hypertension   . Leg DVT (deep venous thromboembolism), chronic   . Arthritis   . HIV infection   . Carotid artery occlusion     40-60% right ICA stenosis (09/2008)  . Hyperlipidemia   . Coronary artery disease     s/p CABG in 2010  . Myocardial infarction   . Stroke 97    tia  . Cancer     chll-chronic lymphocitic leukemia  . H/O hiatal hernia 08    surgery  . Diabetes mellitus     no med now    Medications:  Prescriptions prior to admission  Medication Sig Dispense Refill  . aspirin 81 MG chewable tablet Chew 81 mg by mouth daily.        [name] atazanavir (REYATAZ) 300 MG capsule Take 300 mg by mouth daily with breakfast.        . atorvastatin (LIPITOR) 10 MG tablet Take 1 tablet (10 mg total) by mouth daily.  30 tablet  12  . calcium carbonate (OS-CAL) 600 MG TABS Take 1,200 mg by mouth daily.       . cholecalciferol (VITAMIN D) 1000 UNITS tablet Take 1,000 Units by mouth daily.        . colchicine 0.6 MG tablet Take 0.6 mg by mouth 2 (two) times daily.        . folic acid  (FOLVITE) 1 MG tablet Take 1 mg by mouth daily.        [name] lamiVUDine-zidovudine (COMBIVIR) 150-300 MG per tablet Take 1 tablet by mouth 2 (two) times daily.        . methotrexate (RHEUMATREX) 2.5 MG tablet Take 2.5 mg by mouth once a week. Caution:Chemotherapy. Protect from light. Take 5 tablets once a week on Wednesdays      . metoprolol succinate (TOPROL-XL) 25 MG 24 hr tablet Take 1 tablet (25 mg total) by mouth daily.  30 tablet  12  . pregabalin (LYRICA) 200 MG capsule Take 200 mg by mouth 2 (two) times daily.       . raltegravir (ISENTRESS) 400 MG tablet Take 400 mg by mouth 2 (two) times daily.        . ritonavir (NORVIR) 100 MG capsule Take 100 mg by mouth 1 day or 1 dose.        . sertraline (ZOLOFT) 50 MG tablet Take 50 mg by mouth daily.        . valACYclovir (  VALTREX) 500 MG tablet Take 500 mg by mouth daily.        [name] warfarin (COUMADIN) 10 MG tablet Take 10 mg by mouth daily. Managed by Dr. Darrow Bussing      . tadalafil (CIALIS) 20 MG tablet Take 10 mg by mouth daily as needed. Take one half tablet as needed        Assessment: 61 yom to restart coumadin post-op for recurrent LLE DVT and VTE prophylaxis s/p R TKA. Baseline INR 1.01 -coumadin stopped 7 days before surgery. CBC stable at baseline. Pt's home dose was 10mg  daily. Currently on lovenox 30mg  SQ q12h (DVT prophylactic dose) until INR >2.  Goal of Therapy:  INR 2-3   Plan:  1. Daily PT/INR 2. Coumadin 12.5 mg po today  Christoper Fabian, PharmD, BCPS Clinical pharmacist, pager [phone] 07/22/2011,1:59 PM

## 2011-07-22 NOTE — Progress Notes (Signed)
Orthopedic Tech Progress Note Patient Details:  [name] [birth year] [id number]  CPM Right Knee CPM Right Knee: On Right Knee Flexion (Degrees): 40  Right Knee Extension (Degrees): 0    Nidal Rivet T 07/22/2011, 1:22 PM

## 2011-07-22 NOTE — Brief Op Note (Signed)
07/22/2011  11:11 AM  PATIENT:  [name]  61 y.o. male  PRE-OPERATIVE DIAGNOSIS:  Right knee osteoarthritis  POST-OPERATIVE DIAGNOSIS:  Right knee osteoarthritis  PROCEDURE:  Procedure(s): COMPUTER ASSISTED TOTAL KNEE ARTHROPLASTY  SURGEON:  Surgeon(s): Cammy Copa, MD  ASSISTANT: Jodene Nam  ANESTHESIA:   general  EBL: 100 ml    Total I/O In: 2000 [I.V.:2000] Out: 700 [Urine:550; Blood:150]  BLOOD ADMINISTERED: none  DRAINS: (right) Hemovact drain(s) in the knee with  Suction Clamped   LOCAL MEDICATIONS USED:  none  SPECIMEN:  No Specimen  COUNTS:  YES  TOURNIQUET:   Total Tourniquet Time Documented: Thigh (Right) - 110 minutes  DICTATION: .Other Dictation: Dictation Number [id number]  PLAN OF CARE: Admit to inpatient   PATIENT DISPOSITION:  PACU - hemodynamically stable

## 2011-07-22 NOTE — Progress Notes (Signed)
Orthopedic Tech Progress Note Patient Details:  [name] 1951/04/22 [id number]  Patient ID: [name], male   DOB: 1950/11/05, 61 y.o.   MRN: [id number] Patient has knee immobilizer.  [name] T 07/22/2011, 1:21 PM

## 2011-07-22 NOTE — H&P (Signed)
[name] is an 61 y.o. male.   Chief Complaint: Right knee pain HPI: [name] is a 61 year old patient with right knee pain. The patient has a long history of arthritis in the right knee. This knee arthritis has been refractory to nonoperative management including rest strengthening medications bracing and multiple injections. The patient has significant pain with all ADLs which inhibits him from walking and performing activities of daily living. The patient has rest pain at night pain all of which are refractory to nonoperative measures. The patient has had a left total knee replacement approximately 10 years ago. He has done well with this procedure. His medical condition has been optimized prior to the surgery.  Past Medical History  Diagnosis Date  . Hypertension   . Leg DVT (deep venous thromboembolism), chronic   . Arthritis   . HIV infection   . Carotid artery occlusion     40-60% right ICA stenosis (09/2008)  . Hyperlipidemia   . Coronary artery disease     s/p CABG in 2010  . Myocardial infarction   . Stroke 97    tia  . Cancer     chll-chronic lymphocitic leukemia  . H/O hiatal hernia 08    surgery  . Diabetes mellitus     no med now    Past Surgical History  Procedure Date  . Spine surgery   . Cholecystectomy   . Coronary artery bypass graft 2010  . Hernia repair     rt, lf,ventral  . Hiatal hernia repair   . Shoulder surgery     rt lft repairs  . Mandible surgery     tmj  . Varicose vein     stripping  . Knee arthroplasty     left  . Tonsillectomy     History reviewed. No pertinent family history. Social History:  reports that he has never smoked. He has never used smokeless tobacco. He reports that he drinks alcohol. He reports that he does not use illicit drugs.  Allergies:  Allergies  Allergen Reactions  . Morphine     REACTION: severe headache  . Other Hives    Pecan  . Oxycodone-Acetaminophen     REACTION: headache  . Penicillins       REACTION: red, flushed  . Promethazine Hcl     REACTION: makes him feel drunk    Medications Prior to Admission  Medication Dose Route Frequency Provider Last Rate Last Dose  . clindamycin (CLEOCIN) IVPB 600 mg  600 mg Intravenous 60 min Pre-Op Cammy Copa, MD       Medications Prior to Admission  Medication Sig Dispense Refill  . aspirin 81 MG chewable tablet Chew 81 mg by mouth daily.        [name] atazanavir (REYATAZ) 300 MG capsule Take 300 mg by mouth daily with breakfast.        . atorvastatin (LIPITOR) 10 MG tablet Take 1 tablet (10 mg total) by mouth daily.  30 tablet  12  . calcium carbonate (OS-CAL) 600 MG TABS Take 1,200 mg by mouth daily.       . cholecalciferol (VITAMIN D) 1000 UNITS tablet Take 1,000 Units by mouth daily.        . colchicine 0.6 MG tablet Take 0.6 mg by mouth 2 (two) times daily.        . folic acid (FOLVITE) 1 MG tablet Take 1 mg by mouth daily.        [name] lamiVUDine-zidovudine (COMBIVIR)  150-300 MG per tablet Take 1 tablet by mouth 2 (two) times daily.        . methotrexate (RHEUMATREX) 2.5 MG tablet Take 2.5 mg by mouth once a week. Caution:Chemotherapy. Protect from light. Take 5 tablets once a week on Wednesdays      . metoprolol succinate (TOPROL-XL) 25 MG 24 hr tablet Take 1 tablet (25 mg total) by mouth daily.  30 tablet  12  . pregabalin (LYRICA) 200 MG capsule Take 200 mg by mouth 2 (two) times daily.       . raltegravir (ISENTRESS) 400 MG tablet Take 400 mg by mouth 2 (two) times daily.        . ritonavir (NORVIR) 100 MG capsule Take 100 mg by mouth 1 day or 1 dose.        . sertraline (ZOLOFT) 50 MG tablet Take 50 mg by mouth daily.        . valACYclovir (VALTREX) 500 MG tablet Take 500 mg by mouth daily.        [name] warfarin (COUMADIN) 10 MG tablet Take 10 mg by mouth daily. Managed by Dr. Darrow Bussing      . tadalafil (CIALIS) 20 MG tablet Take 10 mg by mouth daily as needed. Take one half tablet as needed        Results for orders placed  during the hospital encounter of 07/22/11 (from the past 48 hour(s))  HEPATIC FUNCTION PANEL     Status: Abnormal   Collection Time   07/22/11  6:08 AM      Component Value Range Comment   Total Protein 6.3  6.0 - 8.3 (g/dL)    Albumin 4.0  3.5 - 5.2 (g/dL)    AST 70 (*) 0 - 37 (U/L)    ALT 61 (*) 0 - 53 (U/L)    Alkaline Phosphatase 115  39 - 117 (U/L)    Total Bilirubin 3.4 (*) 0.3 - 1.2 (mg/dL)    Bilirubin, Direct 0.2  0.0 - 0.3 (mg/dL) HEMOLYSIS AT THIS LEVEL MAY AFFECT RESULT   Indirect Bilirubin 3.2 (*) 0.3 - 0.9 (mg/dL)   PROTIME-INR     Status: Normal   Collection Time   07/22/11  6:08 AM      Component Value Range Comment   Prothrombin Time 13.5  11.6 - 15.2 (seconds)    INR 1.01  0.00 - 1.49     No results found.  Review of Systems  Constitutional: Negative.   HENT: Negative.   Eyes: Negative.   Respiratory: Negative.   Cardiovascular: Negative.   Gastrointestinal: Negative.   Genitourinary: Negative.   Musculoskeletal: Positive for joint pain.  Skin: Negative.   Neurological: Negative.   Endo/Heme/Allergies: Negative.   Psychiatric/Behavioral: Negative.     Blood pressure 107/69, pulse 71, temperature 97.7 F (36.5 C), temperature source Oral, resp. rate 18, SpO2 98.00%. Physical Exam  Constitutional: He appears well-developed and well-nourished.  HENT:  Head: Normocephalic.  Eyes: Pupils are equal, round, and reactive to light.  Neck: Normal range of motion.  Cardiovascular: Normal rate.   Respiratory: Effort normal.  GI: Soft.  Neurological: He is alert.  Skin: Skin is warm.  Psychiatric: He has a normal mood and affect.   examination of the right knee demonstrates intact skin trace effusion mild varus alignment medial joint line tenderness intact extensor mechanism palpable pedal pulses soft compartments.  Assessment/Plan Impression is right knee arthritis in a patient with multiple medical comorbidities and potential complicating medical conditions.  The patient is HIV positive but I have obtained medical risk stratification from the infectious disease doctor who believes that his condition is optimized currently. Patient also has a history of deep vein thrombosis. I discussed with his primary care physician Lovenox bridge which was instituted a week ago. We will use SCDs and immediate CPM in recovery room to minimize the risk of deep vein thrombosis. Patient also has history of coronary artery disease which is stable without symptoms. The risk and benefits of surgical intervention were discussed with the patient including but limited to infection nerve vessel damage infection knee stiffness need for more surgery deep vein thrombosis and death. They should understands risks and benefits and agrees to proceed with surgical intervention. Longevity of prosthesis also discussed. All questions answered. The patient is at moderately high risk for surgical complications. He understands this and wishes to proceed.  Petro Talent SCOTT 07/22/2011, 7:22 AM

## 2011-07-22 NOTE — Brief Op Note (Cosign Needed)
07/22/2011  10:45 AM  PATIENT:  [name]  61 y.o. male  PRE-OPERATIVE DIAGNOSIS:  Right knee osteoarthritis  POST-OPERATIVE DIAGNOSIS:  Right knee osteoarthritis  PROCEDURE:  Procedure(s) (LRB): COMPUTER ASSISTED TOTAL KNEE ARTHROPLASTY (Right)  SURGEON:  Surgeon(s) and Role:    * Cammy Copa, MD - Primary  PHYSICIAN ASSISTANT: Pamlea Finder PAC  ASSISTANTS: none   ANESTHESIA:   general  EBL:  Total I/O In: 2000 [I.V.:2000] Out: 700 [Urine:550; Blood:150]  BLOOD ADMINISTERED:none  DRAINS: Penrose drain in the RIGHT KNEE   LOCAL MEDICATIONS USED:  OTHER MARCAINE ,CLONIDINE   SPECIMEN:  No Specimen  DISPOSITION OF SPECIMEN:  N/A  COUNTS:  YES  TOURNIQUET:   Total Tourniquet Time Documented: Thigh (Right) - 110 minutes  DICTATION: .Note written in EPIC  PLAN OF CARE: Admit to inpatient   PATIENT DISPOSITION:  PACU - hemodynamically stable.   Delay start of Pharmacological VTE agent (>24hrs) due to surgical blood loss or risk of bleeding: yes

## 2011-07-22 NOTE — Anesthesia Postprocedure Evaluation (Signed)
Anesthesia Post Note  Patient: [name]  Procedure(s) Performed: Procedure(s) (LRB): COMPUTER ASSISTED TOTAL KNEE ARTHROPLASTY (Right)  Anesthesia type: GA  Patient location: PACU  Post pain: Pain level controlled  Post assessment: Post-op Vital signs reviewed  Last Vitals:  Filed Vitals:   07/22/11 1145  BP:   Pulse: 84  Temp:   Resp: 17    Post vital signs: Reviewed  Level of consciousness: sedated  Complications: No apparent anesthesia complications

## 2011-07-22 NOTE — Plan of Care (Signed)
Problem: Consults Goal: Diagnosis- Total Joint Replacement Outcome: Completed/Met Date Met:  07/22/11 Primary Total Knee Right

## 2011-07-22 NOTE — Transfer of Care (Signed)
Immediate Anesthesia Transfer of Care Note  Patient: [name]  Procedure(s) Performed: Procedure(s) (LRB): COMPUTER ASSISTED TOTAL KNEE ARTHROPLASTY (Right)  Patient Location: PACU  Anesthesia Type: General  Level of Consciousness: alert  and oriented  Airway & Oxygen Therapy: Patient Spontanous Breathing and Patient connected to nasal cannula oxygen  Post-op Assessment: Report given to PACU RN and Post -op Vital signs reviewed and stable  Post vital signs: stable  Complications: No apparent anesthesia complications

## 2011-07-23 LAB — CBC
HCT: 30.7 % — ABNORMAL LOW (ref 39.0–52.0)
Hemoglobin: 10.8 g/dL — ABNORMAL LOW (ref 13.0–17.0)
MCH: 38.8 pg — ABNORMAL HIGH (ref 26.0–34.0)
MCHC: 35.2 g/dL (ref 30.0–36.0)
MCV: 110.4 fL — ABNORMAL HIGH (ref 78.0–100.0)
Platelets: 131 10*3/uL — ABNORMAL LOW (ref 150–400)
RBC: 2.78 MIL/uL — ABNORMAL LOW (ref 4.22–5.81)
RDW: 14.3 % (ref 11.5–15.5)
WBC: 11.1 10*3/uL — ABNORMAL HIGH (ref 4.0–10.5)

## 2011-07-23 LAB — PROTIME-INR
INR: 1.12 (ref 0.00–1.49)
Prothrombin Time: 14.6 seconds (ref 11.6–15.2)

## 2011-07-23 LAB — BASIC METABOLIC PANEL
BUN: 14 mg/dL (ref 6–23)
CO2: 26 mEq/L (ref 19–32)
Calcium: 8.7 mg/dL (ref 8.4–10.5)
Chloride: 101 mEq/L (ref 96–112)
Creatinine, Ser: 0.66 mg/dL (ref 0.50–1.35)
GFR calc Af Amer: >90 mL/min (ref >90)
GFR calc non Af Amer: >90 mL/min (ref >90)
Glucose, Bld: 144 mg/dL — ABNORMAL HIGH (ref 70–99)
Potassium: 4.1 mEq/L (ref 3.5–5.1)
Sodium: 135 mEq/L (ref 135–145)

## 2011-07-23 LAB — GLUCOSE, CAPILLARY
Glucose-Capillary: 149 mg/dL — ABNORMAL HIGH (ref 70–99)
Glucose-Capillary: 142 mg/dL — ABNORMAL HIGH (ref 70–99)
Glucose-Capillary: 136 mg/dL — ABNORMAL HIGH (ref 70–99)
Glucose-Capillary: 107 mg/dL — ABNORMAL HIGH (ref 70–99)

## 2011-07-23 MED ORDER — WARFARIN SODIUM 2.5 MG PO TABS
12.5000 mg | ORAL_TABLET | Freq: Once | ORAL | Status: AC
  Administered 2011-07-23: 12.5 mg via ORAL
  Filled 2011-07-23: qty 1

## 2011-07-23 NOTE — Progress Notes (Addendum)
Physical Therapy Evaluation Patient Details Name: [name] MRN: [id number] DOB: 08-21-1950 Today's Date: 07/23/2011  Problem List:  Patient Active Problem List  Diagnoses  . HYPERLIPIDEMIA-MIXED  . CAD, NATIVE VESSEL  . DVT  . Other abnormal glucose  . Impotence of organic origin  . HIV disease  . Rheumatoid arthritis  . CLL (chronic lymphoblastic leukemia)  . Carotid artery occlusion  . Hyperlipidemia    Past Medical History:  Past Medical History  Diagnosis Date  . Hypertension   . Leg DVT (deep venous thromboembolism), chronic   . Arthritis   . HIV infection   . Carotid artery occlusion     40-60% right ICA stenosis (09/2008)  . Hyperlipidemia   . Coronary artery disease     s/p CABG in 2010  . Myocardial infarction   . Stroke 97    tia  . Cancer     chll-chronic lymphocitic leukemia  . H/O hiatal hernia 08    surgery  . Diabetes mellitus     no med now   Past Surgical History:  Past Surgical History  Procedure Date  . Spine surgery   . Cholecystectomy   . Coronary artery bypass graft 2010  . Hernia repair     rt, lf,ventral  . Hiatal hernia repair   . Shoulder surgery     rt lft repairs  . Mandible surgery     tmj  . Varicose vein     stripping  . Knee arthroplasty     left  . Tonsillectomy     PT Assessment/Plan/Recommendation PT Assessment Clinical Impression Statement: 61 yo male s/p RTKA and with chronic Low Back Pain with previous back surgery presents to PT with decreased functional mobility; will benefit form PT to maximize independence and safety with mobility, amb, steps, activity tolerance, to enable safe dc home PT Recommendation/Assessment: Patient will need skilled PT in the acute care venue PT Problem List: Decreased strength;Decreased range of motion;Decreased activity tolerance;Decreased mobility;Decreased knowledge of use of DME;Pain;Decreased knowledge of precautions PT Therapy Diagnosis : Difficulty walking;Acute  pain;Abnormality of gait PT Plan PT Frequency: 7X/week PT Treatment/Interventions: DME instruction;Gait training;Stair training;Functional mobility training;Therapeutic activities;Therapeutic exercise;Patient/family education PT Recommendation Recommendations for Other Services: OT consult Follow Up Recommendations: Home health PT;Supervision/Assistance - 24 hour Equipment Recommended: Rolling walker with 5" wheels;3 in 1 bedside comode PT Goals  Acute Rehab PT Goals PT Goal Formulation: With patient Time For Goal Achievement: 7 days Pt will go Supine/Side to Sit: with supervision PT Goal: Supine/Side to Sit - Progress: Goal set today Pt will go Sit to Supine/Side: with supervision PT Goal: Sit to Supine/Side - Progress: Goal set today Pt will go Sit to Stand: with supervision PT Goal: Sit to Stand - Progress: Goal set today Pt will go Stand to Sit: with supervision PT Goal: Stand to Sit - Progress: Goal set today Pt will Ambulate: >150 feet;with supervision;with rolling walker PT Goal: Ambulate - Progress: Goal set today Pt will Go Up / Down Stairs: 3-5 stairs;with supervision;with rail(s) PT Goal: Up/Down Stairs - Progress: Goal set today Pt will Perform Home Exercise Program: Independently PT Goal: Perform Home Exercise Program - Progress: Goal set today  PT Evaluation Precautions/Restrictions  Precautions Precautions: Knee Required Braces or Orthoses: Yes Knee Immobilizer: On when out of bed or walking (KI in room; used for pt confidence with amb) Restrictions Weight Bearing Restrictions: Yes RLE Weight Bearing: Weight bearing as tolerated Prior Functioning  Home Living Lives With: Alone Receives Help  From: Friend(s) (Friend can stay overnight with pt) Type of Home: Mobile home Home Layout: One level Home Access: Stairs to enter Entrance Stairs-Rails: Right;Left;Can reach both Entrance Stairs-Number of Steps: 4 Bathroom Shower/Tub: Marketing executive:  (Need to verify home adaptive equipment) Prior Function Level of Independence: Independent with homemaking with ambulation Able to Take Stairs?: Yes Driving: Yes Vocation: Full time employment Comments: Pt has had back surgery and chronic back pain, which has effected his activity tolerance Cognition Cognition Arousal/Alertness: Awake/alert Overall Cognitive Status: Appears within functional limits for tasks assessed Orientation Level: Oriented X4 Sensation/Coordination Sensation Light Touch: Appears Intact Coordination Gross Motor Movements are Fluid and Coordinated: Yes Fine Motor Movements are Fluid and Coordinated: Yes Extremity Assessment RUE Assessment RUE Assessment: Within Functional Limits LUE Assessment LUE Assessment: Within Functional Limits RLE Assessment RLE Assessment: Exceptions to [facility] RLE Strength RLE Overall Strength Comments: Grossly decr aROM and strength, limited by pain postop; muscle guardng with AAROM into knee flexion (to approx 30 deg by visual estimate); good quad activation LLE Assessment LLE Assessment: Within Functional Limits Mobility (including Balance) Bed Mobility Bed Mobility: Yes Supine to Sit: 4: Min assist;With rails Supine to Sit Details (indicate cue type and reason): cues for technique Sitting - Scoot to Edge of Bed: 4: Min assist (Guard assist) Sitting - Scoot to [place] of Bed Details (indicate cue type and reason): cues for technique and safety Sit to Supine:  (Did not perform, but pt wanted to discuss technique)  Demonstration cues for technique  Transfers Transfers: Yes Sit to Stand: 4: Min assist;From bed Sit to Stand Details (indicate cue type and reason): cues fro safety, hand placement, technique Stand to Sit: Min assist Cues for safety, technique Stand to Sit Details:Ambulation/Gait Ambulation/Gait: Yes Ambulation/Gait Assistance: 4: Min assist Ambulation/Gait Assistance Details (indicate cue type and reason):  Cues for gait sequence, and to activate quad for right stance stability; cues for posture, however, pt states he's quite stooped premorbidly; Noed he tends to flex trunk more with fatigue Ambulation Distance (Feet): 70 Feet Assistive device: Rolling walker Gait Pattern: Step-to pattern    Exercise  Total Joint Exercises Ankle Circles/Pumps: AROM;Both;20 reps;Seated Quad Sets: AROM;Right;10 reps;Supine Heel Slides: AAROM;Right;5 reps;Supine End of Session PT - End of Session Equipment Utilized During Treatment: Gait belt;Right knee immobilizer Activity Tolerance: Patient tolerated treatment well Patient left: in chair;with call bell in reach Nurse Communication: Mobility status for transfers;Mobility status for ambulation General Behavior During Session: [facility] for tasks performed Cognition: [facility] for tasks performed  [facility] [place], [place] [phone]  07/23/2011, 12:39 PM

## 2011-07-23 NOTE — Progress Notes (Signed)
UR COMPLETED  

## 2011-07-23 NOTE — Op Note (Signed)
[name], [name]               ACCOUNT NO.:  [id number]  MEDICAL RECORD NO.:  [id number]  LOCATION:  5010                         FACILITY:  [facility]  PHYSICIAN:  [name], M.D.    DATE OF BIRTH:  May 10, 1951  DATE OF PROCEDURE: DATE OF DISCHARGE:                              OPERATIVE REPORT   PREOPERATIVE DIAGNOSIS:  Right knee arthritis.  POSTOPERATIVE DIAGNOSIS:  Right knee arthritis.  PROCEDURE:  Right total knee replacement using DePuy posterior cruciate sacrificing rotating platform components 5 femur, 4 tibia, 12.5 poly, 41 patella.  SURGEON:  Christopher Bunting, MD  ASSISTANT:  Christopher Neighbors, PA  TOURNIQUET TIME:  110 minutes at 300 mmHg.  INDICATIONS:  [name] is a 61 year old patient with end-stage right knee arthritis who presents for operative management after explanation of risks and benefits.  PROCEDURE IN DETAIL:  The patient was brought to the operating room where general endotracheal anesthesia was induced, preop antibiotics were administered.  Time-out was called.  Right leg was pre-scrubbed with alcohol and Betadine after which allowed to air dry, prepped with DuraPrep solution, and draped in sterile manner.  [name] was used to cover the operative field.  Leg was then elevated and exsanguinated with Esmarch wrap.  Tourniquet was inflated.  The patient had valgus aligned knee.  Incision was made.  Skin and subcutaneous tissue were sharply divided.  Median parapatellar approach to the knee was made.  Precise location was marked with #1 Vicryl suture.  Lateral patellofemoral ligament was released.  Soft tissue was removed from the anterior distal portion of the femur.  Fat pad was partially excised.  Care was taken to avoid much in the way of medial-sided stripping because this was a valgus knee.  ACL, PCL were released.  The patient had significant wear in the lateral compartment.  Two pins were placed in the proximal medial tibia and distal  medial femur.  Registration points were achieved including the hip center rotation, bimalleolar axis, and various points around the knee.  Tibial cut was made with the posterior neurovascular structures and collateral ligaments were protected.  It was a 1.5-mm cut of the most affected side laterally and about 10 mm cut of the least affected side medially.  Tensioning device was then placed in extension and flexion.  Femur was size 5.  Correct external rotation was confirmed.  The femur was then cut anterior, posterior, and chamfer cuts.  Box cut was then made for size 5.  Tibia was then keel punched for size 4.  The patella was then prepared from 25 resection down to 14 and thickness.  41  patella was placed and movement was then performed with both 10 and 12.5-mm trials.  Then, with the 12.5 poly, the patient had about 2 degrees of extension against gravity with neutral alignment, full flexion.  Small lateral release was required for patellar tracking in the valgus knee; however, once that was done, the patella tracked in excellent fashion with no thumbs technique through full range of motion. At this time, trial components were removed.  Thorough irrigation with 3 L of irrigating solution was performed.  True components were cemented into place with  excess cement removed.  The 12.5-poly spacer gave excellent reduction.  The tourniquet was released after first placing some thrombin into the joint.  Thrombin was placed into the joint because the patient is going to have to go back on Lovenox for today because of his history of DVT.  The thrombin was placed.  Tourniquet was released.  Bleeding points were encountered and controlled with electrocautery.  The patient maintained the same stability parameters and had good stability to varus and valgus stress at 0, 30, and 90 degrees.  The incision was then closed over bolster by using #1 Vicryl, reapproximate the arthrotomy.  Hemovac drain was  placed.  This was followed by 0 Vicryl suture, 2-0 Vicryl suture, and skin staples.  The incisions for the pin sites were irrigated and closed using 3-0 nylon. Solution of Marcaine and clonidine was injected into the knee.  Bulky wrap was placed.  [name]'s assistance was required all times during the case for retraction of important neurovascular structures. Her assistance was a medical necessity.     [name], M.D.     GSD/MEDQ  D:  07/22/2011  T:  07/23/2011  Job:  [id number]

## 2011-07-23 NOTE — Progress Notes (Signed)
Subjective: Pt stable - pain controlled   Objective: Vital signs in last 24 hours: Temp:  [97.3 F (36.3 C)-98.4 F (36.9 C)] 98 F (36.7 C) (03/13 0708) Pulse Rate:  [70-86] 70  (03/13 0708) Resp:  [12-20] 20  (03/13 0708) BP: (95-134)/(49-80) 95/49 mmHg (03/13 0708) SpO2:  [94 %-100 %] 94 % (03/13 0708) Weight:  [85.5 kg (188 lb 7.9 oz)] 85.5 kg (188 lb 7.9 oz) (03/12 2000)  Intake/Output from previous day: 03/12 0701 - 03/13 0700 In: 2773.8 [I.V.:2723.8; IV Piggyback:50] Out: 4950 [Urine:4600; Drains:200; Blood:150] Intake/Output this shift:    Exam:  Sensation intact distally Intact pulses distally Dorsiflexion/Plantar flexion intact  Labs: No results found for this basename: HGB:5 in the last 72 hours No results found for this basename: WBC:2,RBC:2,HCT:2,PLT:2 in the last 72 hours No results found for this basename: NA:2,K:2,CL:2,CO2:2,BUN:2,CREATININE:2,GLUCOSE:2,CALCIUM:2 in the last 72 hours  Basename 07/22/11 0608  LABPT --  INR 1.01    Assessment/Plan: CPM - OOB - PT - D/C PCA today - oral pain meds - possible snf Friday - on lovenox for dvt prophylaxis   Xavyer Steenson SCOTT 07/23/2011, 7:10 AM

## 2011-07-23 NOTE — Progress Notes (Signed)
Physical Therapy Note   07/23/11 1742  PT Visit Information  Last PT Received On 07/23/11  Precautions  Precautions Knee  Knee Immobilizer On when out of bed or walking  Restrictions  Weight Bearing Restrictions Yes  RLE Weight Bearing WBAT  Bed Mobility  Supine to Sit 4: Min assist;With rails  Supine to Sit Details (indicate cue type and reason) safety cues  Sitting - Scoot to Edge of Bed 5: Supervision  Sitting - Scoot to Edge of Bed Details (indicate cue type and reason) safety cues  Sit to Supine 4: Min assist  Sit to Supine - Details (indicate cue type and reason) cues for technique  Transfers  Sit to Stand 4: Min assist (guard assist)  Sit to Stand Details (indicate cue type and reason) cues for hand placement; good, smooth transition  Stand to Sit 4: Min assist  Stand to Sit Details cues for safety, ahnd placement  Ambulation/Gait  Ambulation/Gait Assistance 4: Min assist (Guard assist)  Ambulation/Gait Assistance Details (indicate cue type and reason) continued cues for gait sequence and to activate quad for stance stabilituy  Ambulation Distance (Feet) 35 Feet  Assistive device Rolling walker  Gait Pattern Step-to pattern  Exercises  Exercises Total Joint  Total Joint Exercises  Ankle Circles/Pumps AROM;Both;20 reps;Seated  Quad Sets AROM;Right;10 reps;Supine  Heel Slides (Pt declined)  Short Arc Quad AROM;Right;10 reps;Supine  PT - End of Session  Equipment Utilized During Treatment Gait belt;Right knee immobilizer  Activity Tolerance Patient tolerated treatment well  Patient left in bed;in CPM;with call bell in reach  Nurse Communication Mobility status for transfers;Mobility status for ambulation  General  Behavior During Session [facility] for tasks performed  Cognition [facility] for tasks performed  PT - Assessment/Plan  Comments on Treatment Session Showing good improvements  PT Plan Discharge plan remains appropriate  PT Frequency 7X/week  Recommendations for  Other Services OT consult  Follow Up Recommendations Home health PT;Supervision/Assistance - 24 hour  Equipment Recommended Rolling walker with 5" wheels;3 in 1 bedside comode  Acute Rehab PT Goals  Time For Goal Achievement 7 days  Pt will go Supine/Side to Sit with supervision  PT Goal: Supine/Side to Sit - Progress Progressing toward goal  Pt will go Sit to Supine/Side with supervision  PT Goal: Sit to Supine/Side - Progress Progressing toward goal  Pt will go Sit to Stand with supervision  PT Goal: Sit to Stand - Progress Progressing toward goal  Pt will go Stand to Sit with supervision  PT Goal: Stand to Sit - Progress Progressing toward goal  Pt will Ambulate >150 feet;with supervision;with rolling walker  PT Goal: Ambulate - Progress Progressing toward goal  Pt will Perform Home Exercise Program Independently  PT Goal: Perform Home Exercise Program - Progress Progressing toward goal    Sharon [place], [place] [phone]

## 2011-07-23 NOTE — Progress Notes (Signed)
ANTICOAGULATION CONSULT NOTE - Follow Up Consult  Pharmacy Consult for Coumadin  Indication: VTE prophylaxis s/p R TKA  Allergies  Allergen Reactions  . Morphine     REACTION: severe headache  . Other Hives    Pecan  . Oxycodone-Acetaminophen     REACTION: headache  . Penicillins     REACTION: red, flushed  . Promethazine Hcl     REACTION: makes him feel drunk    Patient Measurements: Height: 5\' 9"  (175.3 cm) Weight: 188 lb 7.9 oz (85.5 kg) IBW/kg (Calculated) : 70.7   Vital Signs: Temp: 98 F (36.7 C) (03/13 0708) BP: 95/49 mmHg (03/13 0708) Pulse Rate: 70  (03/13 0708)  Labs:  Basename 07/23/11 0645 07/22/11 0608  HGB 10.8* --  HCT 30.7* --  PLT 131* --  APTT -- --  LABPROT 14.6 13.5  INR 1.12 1.01  HEPARINUNFRC -- --  CREATININE -- --  CKTOTAL -- --  CKMB -- --  TROPONINI -- --   Estimated Creatinine Clearance: 93.1 ml/min (by C-G formula based on Cr of 0.88).   Medications:  Scheduled:    . antiseptic oral rinse  15 mL Mouth Rinse BID  . atazanavir  300 mg Oral Q breakfast  . atorvastatin  10 mg Oral q1800  . calcium carbonate  2 tablet Oral Daily  . cholecalciferol  1,000 Units Oral Daily  . clindamycin (CLEOCIN) IV  600 mg Intravenous Q8H  . docusate sodium  100 mg Oral BID  . enoxaparin  30 mg Subcutaneous Q12H  . folic acid  1 mg Oral Daily  . HYDROmorphone PCA 0.3 mg/mL   Intravenous Q4H  . insulin aspart  0-15 Units Subcutaneous TID WC  . lamiVUDine-zidovudine  1 tablet Oral BID  . metoprolol succinate  25 mg Oral Daily  . pregabalin  200 mg Oral BID  . raltegravir  400 mg Oral BID  . ritonavir  100 mg Oral Daily  . sertraline  50 mg Oral Daily  . valACYclovir  500 mg Oral Daily  . warfarin  12.5 mg Oral ONCE-1800  . Warfarin - Pharmacist Dosing Inpatient   Does not apply q1800  . DISCONTD: calcium carbonate  1,200 mg Oral Daily  . DISCONTD: cloNIDine  150 mcg Intra-articular To OR    Assessment: 61 yom to restart coumadin post-op  for recurrent LLE DVT and VTE prophylaxis s/p R TKA. INR at 1.12 (Baseline INR 1.01; coumadin stopped 7 days before surgery).  Pt's home dose was 10mg  daily. Currently on lovenox 30mg  SQ q12h (DVT prophylactic dose) until INR >2.  Goal of Therapy:  INR 2-3   Plan:  -Will continue coumadin 12.5mg  x1  Benny Lennert 07/23/2011,8:19 AM

## 2011-07-24 ENCOUNTER — Other Ambulatory Visit: Payer: Medicare Other

## 2011-07-24 LAB — CBC
HCT: 30.4 % — ABNORMAL LOW (ref 39.0–52.0)
Hemoglobin: 10.2 g/dL — ABNORMAL LOW (ref 13.0–17.0)
MCH: 38.1 pg — ABNORMAL HIGH (ref 26.0–34.0)
MCHC: 33.6 g/dL (ref 30.0–36.0)
MCV: 113.4 fL — ABNORMAL HIGH (ref 78.0–100.0)
Platelets: 144 10*3/uL — ABNORMAL LOW (ref 150–400)
RBC: 2.68 MIL/uL — ABNORMAL LOW (ref 4.22–5.81)
RDW: 15.1 % (ref 11.5–15.5)
WBC: 9.9 10*3/uL (ref 4.0–10.5)

## 2011-07-24 LAB — GLUCOSE, CAPILLARY
Glucose-Capillary: 91 mg/dL (ref 70–99)
Glucose-Capillary: 83 mg/dL (ref 70–99)
Glucose-Capillary: 104 mg/dL — ABNORMAL HIGH (ref 70–99)
Glucose-Capillary: 106 mg/dL — ABNORMAL HIGH (ref 70–99)

## 2011-07-24 LAB — PROTIME-INR
INR: 1.36 (ref 0.00–1.49)
Prothrombin Time: 17 seconds — ABNORMAL HIGH (ref 11.6–15.2)

## 2011-07-24 MED ORDER — WARFARIN SODIUM 2.5 MG PO TABS
12.5000 mg | ORAL_TABLET | Freq: Once | ORAL | Status: AC
  Administered 2011-07-24: 12.5 mg via ORAL
  Filled 2011-07-24: qty 1

## 2011-07-24 NOTE — Progress Notes (Signed)
ANTICOAGULATION CONSULT NOTE - Follow Up Consult  Pharmacy Consult for Coumadin  Indication: VTE prophylaxis s/p R TKA  Allergies  Allergen Reactions  . Morphine     REACTION: severe headache  . Other Hives    Pecan  . Oxycodone-Acetaminophen     REACTION: headache  . Penicillins     REACTION: red, flushed  . Promethazine Hcl     REACTION: makes him feel drunk    Patient Measurements: Height: 5\' 9"  (175.3 cm) Weight: 188 lb 7.9 oz (85.5 kg) IBW/kg (Calculated) : 70.7   Vital Signs: Temp: 98.4 F (36.9 C) (03/14 0550) BP: 113/61 mmHg (03/14 0550) Pulse Rate: 73  (03/14 0550)  Labs:  Basename 07/24/11 0558 07/23/11 0645 07/22/11 0608  HGB 10.2* 10.8* --  HCT 30.4* 30.7* --  PLT 144* 131* --  APTT -- -- --  LABPROT 17.0* 14.6 13.5  INR 1.36 1.12 1.01  HEPARINUNFRC -- -- --  CREATININE -- 0.66 --  CKTOTAL -- -- --  CKMB -- -- --  TROPONINI -- -- --   Estimated Creatinine Clearance: 102.4 ml/min (by C-G formula based on Cr of 0.66).   Medications:  Scheduled:     . antiseptic oral rinse  15 mL Mouth Rinse BID  . atazanavir  300 mg Oral Q breakfast  . atorvastatin  10 mg Oral q1800  . calcium carbonate  2 tablet Oral Daily  . cholecalciferol  1,000 Units Oral Daily  . docusate sodium  100 mg Oral BID  . enoxaparin  30 mg Subcutaneous Q12H  . folic acid  1 mg Oral Daily  . HYDROmorphone PCA 0.3 mg/mL   Intravenous Q4H  . insulin aspart  0-15 Units Subcutaneous TID WC  . lamiVUDine-zidovudine  1 tablet Oral BID  . metoprolol succinate  25 mg Oral Daily  . pregabalin  200 mg Oral BID  . raltegravir  400 mg Oral BID  . ritonavir  100 mg Oral Daily  . sertraline  50 mg Oral Daily  . valACYclovir  500 mg Oral Daily  . warfarin  12.5 mg Oral ONCE-1800  . Warfarin - Pharmacist Dosing Inpatient   Does not apply q1800    Assessment: 61 yom to restart coumadin post-op for recurrent LLE DVT and VTE prophylaxis s/p R TKA. INR at 1.36 (Baseline INR 1.01; coumadin  stopped 7 days before surgery).  Pt's home dose was 10mg  daily. Currently on lovenox 30mg  SQ q12h (DVT prophylactic dose) until INR >2.  Goal of Therapy:  INR 2-3   Plan:  -Will continue coumadin 12.5mg  x1  Benny Lennert 07/24/2011,7:43 AM

## 2011-07-24 NOTE — Progress Notes (Signed)
Subjective: Pain controlled - amb in hall   Objective: Vital signs in last 24 hours: Temp:  [98 F (36.7 C)-98.4 F (36.9 C)] 98.4 F (36.9 C) (03/14 0550) Pulse Rate:  [70-73] 73  (03/14 0550) Resp:  [15-18] 18  (03/14 0550) BP: (113-123)/(61-66) 113/61 mmHg (03/14 0550) SpO2:  [97 %-100 %] 98 % (03/14 0550)  Intake/Output from previous day: 03/13 0701 - 03/14 0700 In: 2160 [P.O.:360; I.V.:1800] Out: 400 [Urine:400] Intake/Output this shift:    Exam:  Neurovascular intact Sensation intact distally  Labs:  Basename 07/24/11 0558 07/23/11 0645  HGB 10.2* 10.8*    Basename 07/24/11 0558 07/23/11 0645  WBC 9.9 11.1*  RBC 2.68* 2.78*  HCT 30.4* 30.7*  PLT 144* 131*    Basename 07/23/11 0645  NA 135  K 4.1  CL 101  CO2 26  BUN 14  CREATININE 0.66  GLUCOSE 144*  CALCIUM 8.7    Basename 07/24/11 0558 07/23/11 0645  LABPT -- --  INR 1.36 1.12    Assessment/Plan: Pt doing well poss dc this weekend   Ashna Dorough SCOTT 07/24/2011, 8:25 AM

## 2011-07-24 NOTE — Progress Notes (Signed)
Physical Therapy Treatment Note   07/24/11 0932  PT Visit Information  Last PT Received On 07/24/11  Precautions  Precautions Knee  Restrictions  RLE Weight Bearing WBAT  Bed Mobility  Supine to Sit 4: Min assist  Supine to Sit Details (indicate cue type and reason) minA for R LE management  Sitting - Scoot to Edge of Bed 5: Supervision  Transfers  Sit to Stand 4: Min assist  Sit to Stand Details (indicate cue type and reason) verbal cues for hand placement  Ambulation/Gait  Ambulation/Gait Assistance 4: Min assist (contact guard)  Ambulation/Gait Assistance Details (indicate cue type and reason) pt able to tolerate amb without R KI and maintains good quad set  Ambulation Distance (Feet) 100 Feet (x2 (to/from gym))  Assistive device Rolling walker  Gait Pattern Step-to pattern;Decreased step length - right;Decreased stance time - right  Stairs Yes  Stairs Assistance 4: Min assist  Stairs Assistance Details (indicate cue type and reason) patient with increased R knee pain with asc but felt okay with desc. Pt with increased UE wbing.  Stair Management Technique Two rails  Number of Stairs 4   Total Joint Exercises  Quad Sets AROM;Right;10 reps;Supine  Heel Slides AROM;Right;10 reps;Seated  Short Arc Quad AROM;Right;10 reps;Supine  PT - End of Session  Equipment Utilized During Treatment Gait belt  Activity Tolerance Patient tolerated treatment well  Patient left in chair;with call bell in reach  General  Behavior During Session [facility] for tasks performed  Cognition [facility] for tasks performed  PT - Assessment/Plan  Comments on Treatment Session Patient progressing well towards all goals. Patient with c/o back pain due to longstanding history of back surgeries.  PT Plan Discharge plan remains appropriate  PT Frequency 7X/week  Follow Up Recommendations Home health PT;Supervision/Assistance - 24 hour  Equipment Recommended Rolling walker with 5" wheels;3 in 1 bedside comode  Acute  Rehab PT Goals  PT Goal: Supine/Side to Sit - Progress Progressing toward goal  PT Goal: Sit to Supine/Side - Progress Progressing toward goal  PT Goal: Sit to Stand - Progress Progressing toward goal  PT Goal: Stand to Sit - Progress Progressing toward goal  PT Goal: Ambulate - Progress Progressing toward goal  PT Goal: Up/Down Stairs - Progress Progressing toward goal  PT Goal: Perform Home Exercise Program - Progress Progressing toward goal    Patient supervision with assist to/from bathroom. Patient independent for hygiene/tolieting.  Pain: 5/10 R knee pain  Lewis Shock, PT, DPT Pager #: [phone] Office #: [phone]

## 2011-07-24 NOTE — Evaluation (Signed)
Occupational Therapy Evaluation Patient Details Name: [name] MRN: [id number] DOB: 07/01/1950 Today's Date: 07/24/2011  Problem List:  Patient Active Problem List  Diagnoses  . HYPERLIPIDEMIA-MIXED  . CAD, NATIVE VESSEL  . DVT  . Other abnormal glucose  . Impotence of organic origin  . HIV disease  . Rheumatoid arthritis  . CLL (chronic lymphoblastic leukemia)  . Carotid artery occlusion  . Hyperlipidemia    Past Medical History:  Past Medical History  Diagnosis Date  . Hypertension   . Leg DVT (deep venous thromboembolism), chronic   . Arthritis   . HIV infection   . Carotid artery occlusion     40-60% right ICA stenosis (09/2008)  . Hyperlipidemia   . Coronary artery disease     s/p CABG in 2010  . Myocardial infarction   . Stroke 97    tia  . Cancer     chll-chronic lymphocitic leukemia  . H/O hiatal hernia 08    surgery  . Diabetes mellitus     no med now   Past Surgical History:  Past Surgical History  Procedure Date  . Spine surgery   . Cholecystectomy   . Coronary artery bypass graft 2010  . Hernia repair     rt, lf,ventral  . Hiatal hernia repair   . Shoulder surgery     rt lft repairs  . Mandible surgery     tmj  . Varicose vein     stripping  . Knee arthroplasty     left  . Tonsillectomy     OT Assessment/Plan/Recommendation OT Assessment Clinical Impression Statement: Pt admitted for Rt TKA and presents with below problem list. Will benefit from skilled OT in the acute setting to maximize I with ADL and ADL mobility to S-Mod I level OT Recommendation/Assessment: Patient will need skilled OT in the acute care venue OT Problem List: Decreased knowledge of use of DME or AE;Decreased activity tolerance;Impaired balance (sitting and/or standing);Pain OT Therapy Diagnosis : Acute pain;Generalized weakness OT Plan OT Frequency: Min 2X/week OT Treatment/Interventions: Self-care/ADL training;DME and/or AE instruction;Therapeutic  activities;Balance training;Patient/family education OT Recommendation Follow Up Recommendations: No OT follow up Equipment Recommended: Tub/shower seat (with back) Individuals Consulted Consulted and Agree with Results and Recommendations: Patient OT Goals Acute Rehab OT Goals OT Goal Formulation: With patient Time For Goal Achievement: 7 days ADL Goals Pt Will Perform Lower Body Bathing: with modified independence;Sit to stand from chair;Sit to stand in shower ADL Goal: Lower Body Bathing - Progress: Goal set today Pt Will Perform Lower Body Dressing: with modified independence;Independently;Sit to stand from bed;Sit to stand from chair ADL Goal: Lower Body Dressing - Progress: Goal set today Pt Will Perform Tub/Shower Transfer: Shower transfer;with supervision;Ambulation;with DME;Shower seat with back (backward entry) ADL Goal: Tub/Shower Transfer - Progress: Goal set today  OT Evaluation Precautions/Restrictions  Precautions Precautions: Knee Required Braces or Orthoses: Yes Knee Immobilizer: On when out of bed or walking Restrictions Weight Bearing Restrictions: Yes RLE Weight Bearing: Weight bearing as tolerated Prior Functioning Home Living Lives With: Alone Receives Help From: Friend(s) Type of Home: Mobile home Home Layout: One level Home Access: Stairs to enter Entrance Stairs-Rails: Right;Left;Can reach both Entrance Stairs-Number of Steps: 4 Bathroom Shower/Tub: Health visitor: Standard Bathroom Accessibility: Yes How Accessible: Accessible via walker Home Adaptive Equipment: Bedside commode/3-in-1 Additional Comments: Pt owns shower seat, but states it is falling apart. Asked therapist to order new seat Prior Function Level of Independence: Independent with homemaking with ambulation Able  to Take Stairs?: Yes Driving: Yes Vocation: Full time employment ADL ADL Eating/Feeding: Performed;Independent Where Assessed - Eating/Feeding:  Chair Grooming: Performed;Supervision/safety Where Assessed - Grooming: Standing at sink Upper Body Bathing: Simulated;Set up;Supervision/safety Where Assessed - Upper Body Bathing: Sit to stand from chair Lower Body Bathing: Simulated;Minimal assistance Where Assessed - Lower Body Bathing: Sit to stand from chair Upper Body Dressing: Simulated;Set up Where Assessed - Upper Body Dressing: Sitting, chair;Sitting, bed Lower Body Dressing: Simulated;Supervision/safety;Set up Where Assessed - Lower Body Dressing: Sit to stand from chair Toilet Transfer: Performed;Supervision/safety Toilet Transfer Method: Proofreader: Raised toilet seat with arms (or 3-in-1 over toilet) Toileting - Clothing Manipulation: Performed;Supervision/safety Where Assessed - Toileting Clothing Manipulation: Standing Toileting - Hygiene: Performed;Modified independent Where Assessed - Toileting Hygiene: Sit on 3-in-1 or toilet Tub/Shower Transfer: Performed;Supervision/safety Tub/Shower Transfer Details (indicate cue type and reason): educated pt on backward entry into shower with forward exit. practiced x 2. Patient able to demonstrate I'ly and performed with Supervision-states his friend will be there to assist and provide supervision as necessary Tub/Shower Transfer Method: Ambulating Tub/Shower Transfer Equipment: Walk in shower;Grab bars Equipment Used: Rolling walker Ambulation Related to ADLs: Supervision with RW ambulation. Pt with forward flexed posture, but with no LOB during eval ADL Comments: Will benefit from continued shower transfer practice/education. as well as balance especially with LB dsg Vision/Perception  Vision - History Patient Visual Report: No change from baseline Cognition Cognition Orientation Level: Oriented X4 Sensation/Coordination Sensation Light Touch: Appears Intact Coordination Gross Motor Movements are Fluid and Coordinated: Yes Fine Motor Movements  are Fluid and Coordinated: Yes Extremity Assessment RUE Assessment RUE Assessment: Within Functional Limits LUE Assessment LUE Assessment: Within Functional Limits End of Session OT - End of Session Equipment Utilized During Treatment: Gait belt Activity Tolerance: Patient tolerated treatment well Patient left: in bed;with call bell in reach (NT informed pt wanting to be put in CPM) General Behavior During Session: [facility] for tasks performed Cognition: [facility] for tasks performed   Ulysees Robarts 07/24/2011, 3:26 PM

## 2011-07-25 DIAGNOSIS — B20 Human immunodeficiency virus [HIV] disease: Secondary | ICD-10-CM

## 2011-07-25 DIAGNOSIS — H539 Unspecified visual disturbance: Secondary | ICD-10-CM

## 2011-07-25 LAB — CBC
HCT: 32.6 % — ABNORMAL LOW (ref 39.0–52.0)
Hemoglobin: 11 g/dL — ABNORMAL LOW (ref 13.0–17.0)
MCH: 37.7 pg — ABNORMAL HIGH (ref 26.0–34.0)
MCHC: 33.7 g/dL (ref 30.0–36.0)
MCV: 111.6 fL — ABNORMAL HIGH (ref 78.0–100.0)
Platelets: 159 10*3/uL (ref 150–400)
RBC: 2.92 MIL/uL — ABNORMAL LOW (ref 4.22–5.81)
RDW: 15 % (ref 11.5–15.5)
WBC: 10 10*3/uL (ref 4.0–10.5)

## 2011-07-25 LAB — GLUCOSE, CAPILLARY
Glucose-Capillary: 96 mg/dL (ref 70–99)
Glucose-Capillary: 77 mg/dL (ref 70–99)
Glucose-Capillary: 91 mg/dL (ref 70–99)
Glucose-Capillary: 89 mg/dL (ref 70–99)

## 2011-07-25 LAB — PROTIME-INR
INR: 1.57 — ABNORMAL HIGH (ref 0.00–1.49)
Prothrombin Time: 19.1 seconds — ABNORMAL HIGH (ref 11.6–15.2)

## 2011-07-25 MED ORDER — HYDROMORPHONE HCL 4 MG PO TABS: 4.0000 mg | ORAL_TABLET | ORAL | Status: AC | PRN

## 2011-07-25 MED ORDER — METHOCARBAMOL 500 MG PO TABS: 500.0000 mg | ORAL_TABLET | Freq: Four times a day (QID) | ORAL | Status: AC | PRN

## 2011-07-25 MED ORDER — WARFARIN SODIUM 2.5 MG PO TABS
12.5000 mg | ORAL_TABLET | Freq: Once | ORAL | Status: AC
  Administered 2011-07-25: 12.5 mg via ORAL
  Filled 2011-07-25: qty 1

## 2011-07-25 NOTE — Progress Notes (Signed)
ANTICOAGULATION CONSULT NOTE - Follow Up Consult  Pharmacy Consult for Coumadin  Indication: VTE prophylaxis s/p R TKA  Allergies  Allergen Reactions  . Morphine     REACTION: severe headache  . Other Hives    Pecan  . Oxycodone-Acetaminophen     REACTION: headache  . Penicillins     REACTION: red, flushed  . Promethazine Hcl     REACTION: makes him feel drunk    Patient Measurements: Height: 5\' 9"  (175.3 cm) Weight: 188 lb 7.9 oz (85.5 kg) IBW/kg (Calculated) : 70.7   Vital Signs: Temp: 98.5 F (36.9 C) (03/15 0510) Temp src: Oral (03/15 0510) BP: 101/57 mmHg (03/15 0510) Pulse Rate: 82  (03/15 0510)  Labs:  Basename 07/25/11 0730 07/24/11 0558 07/23/11 0645  HGB 11.0* 10.2* --  HCT 32.6* 30.4* 30.7*  PLT 159 144* 131*  APTT -- -- --  LABPROT 19.1* 17.0* 14.6  INR 1.57* 1.36 1.12  HEPARINUNFRC -- -- --  CREATININE -- -- 0.66  CKTOTAL -- -- --  CKMB -- -- --  TROPONINI -- -- --   Estimated Creatinine Clearance: 102.4 ml/min (by C-G formula based on Cr of 0.66).   Medications:  Scheduled:     . antiseptic oral rinse  15 mL Mouth Rinse BID  . atazanavir  300 mg Oral Q breakfast  . atorvastatin  10 mg Oral q1800  . calcium carbonate  2 tablet Oral Daily  . cholecalciferol  1,000 Units Oral Daily  . docusate sodium  100 mg Oral BID  . enoxaparin  30 mg Subcutaneous Q12H  . folic acid  1 mg Oral Daily  . HYDROmorphone PCA 0.3 mg/mL   Intravenous Q4H  . insulin aspart  0-15 Units Subcutaneous TID WC  . lamiVUDine-zidovudine  1 tablet Oral BID  . metoprolol succinate  25 mg Oral Daily  . pregabalin  200 mg Oral BID  . raltegravir  400 mg Oral BID  . ritonavir  100 mg Oral Daily  . sertraline  50 mg Oral Daily  . valACYclovir  500 mg Oral Daily  . warfarin  12.5 mg Oral ONCE-1800  . Warfarin - Pharmacist Dosing Inpatient   Does not apply q1800    Assessment: 61 y.o. M to restart coumadin post-op for recurrent LLE DVT and VTE prophylaxis s/p R TKA.  INR this morning is SUBtheraeutic (INR 1.57 << 1.36, goal of 2-3) and trending up nicely on a dose slightly higher than PTA. The patient's home dose was 10 mg daily. The patient is also on lovenox 30mg  SQ q12h (DVT prophylactic dose) until INR >/= 2  Goal of Therapy:  INR 2-3   Plan:  1. Warfarin 12.5 mg x 1 dose at 1800 today 2. Will continue to monitor for any signs/symptoms of bleeding and will follow up with PT/INR in the a.m.   Georgina Pillion, PharmD, BCPS Clinical Pharmacist Pager: [phone] 07/25/2011 1:30 PM

## 2011-07-25 NOTE — Progress Notes (Signed)
Subjective: Pt stable amb in hall - having visual field issues   Objective: Vital signs in last 24 hours: Temp:  [98.5 F (36.9 C)-98.6 F (37 C)] 98.5 F (36.9 C) (03/15 0510) Pulse Rate:  [70-82] 82  (03/15 0510) Resp:  [16-18] 16  (03/15 0510) BP: (100-101)/(57-64) 101/57 mmHg (03/15 0510) SpO2:  [96 %-100 %] 96 % (03/15 0510)  Intake/Output from previous day: 03/14 0701 - 03/15 0700 In: 660 [P.O.:660] Out: 550 [Urine:550] Intake/Output this shift: Total I/O In: -  Out: 700 [Urine:700]  Exam:  Neurovascular intact Sensation intact distally Intact pulses distally Dorsiflexion/Plantar flexion intact EOMI  Labs:  Basename 07/25/11 0730 07/24/11 0558 07/23/11 0645  HGB 11.0* 10.2* 10.8*    Basename 07/25/11 0730 07/24/11 0558  WBC 10.0 9.9  RBC 2.92* 2.68*  HCT 32.6* 30.4*  PLT 159 144*    Basename 07/23/11 0645  NA 135  K 4.1  CL 101  CO2 26  BUN 14  CREATININE 0.66  GLUCOSE 144*  CALCIUM 8.7    Basename 07/25/11 0730 07/24/11 0558  LABPT -- --  INR 1.57* 1.36    Assessment/Plan: Dc Sunday - ID consult for visual field - inr not yet therepeutic continue lovenox   [place] Karl SCOTT 07/25/2011, 9:27 AM

## 2011-07-25 NOTE — Progress Notes (Signed)
Physical Therapy Treatment Note   07/25/11 1200  PT Visit Information  Last PT Received On 07/25/11  Precautions  Precautions Knee  Restrictions  RLE Weight Bearing WBAT  Bed Mobility  Bed Mobility (pt received sitting up in chair)  Transfers  Sit to Stand 5: Supervision  Sit to Stand Details (indicate cue type and reason) safe use of walker  Stand to Sit 5: Supervision  Stand to Sit Details good hand placement  Ambulation/Gait  Ambulation/Gait Assistance 5: Supervision  Ambulation/Gait Assistance Details (indicate cue type and reason) pt con't to have increased trunk flexion due to long standing back history and failed surgeries  Ambulation Distance (Feet) 250 Feet  Assistive device Rolling walker  Gait Pattern Step-to pattern;Decreased step length - right;Decreased stance time - right  Stairs Assistance 4: Min assist (contact guard)  Stairs Assistance Details (indicate cue type and reason) significant increased ease this date  Stair Management Technique Two rails  Number of Stairs 4   Total Joint Exercises  Heel Slides AROM;Right;10 reps;Seated  Straight Leg Raises AROM;10 reps;Seated;Right (con't to have knee lag)  [employer] reps;Right;Seated  PT - End of Session  Activity Tolerance Patient tolerated treatment well  Patient left in chair;with call bell in reach  General  Behavior During Session [facility] for tasks performed  Cognition [facility] for tasks performed  PT - Assessment/Plan  Comments on Treatment Session Patient con't to progress towards all goals. Patient with improved R LE strength and active R knee ROM. Patient with significant ease with ambulation and stair negotiation this date.   PT Plan Discharge plan remains appropriate  PT Frequency 7X/week  Follow Up Recommendations Home health PT;Supervision - Intermittent  Equipment Recommended Rolling walker with 5" wheels;3 in 1 bedside comode;Tub/shower bench  Acute Rehab PT Goals  PT Goal: Supine/Side to Sit  - Progress Progressing toward goal  PT Goal: Sit to Supine/Side - Progress Progressing toward goal  PT Goal: Sit to Stand - Progress Progressing toward goal  PT Goal: Stand to Sit - Progress Progressing toward goal  PT Goal: Ambulate - Progress Progressing toward goal  PT Goal: Up/Down Stairs - Progress Progressing toward goal  PT Goal: Perform Home Exercise Program - Progress Met    Pain: 4/10  Lewis Shock, PT, DPT Pager #: [phone] Office #: [phone]

## 2011-07-25 NOTE — Consult Note (Addendum)
Infectious Diseases Initial Consultation  Reason for Consultation:  Vision changes, ? If HIV related.   HPI: [name] is a 61 y.o. male with well controlled HIV on a salvage regimen that he takes very well who came to the hospital for a planned TKA.  No significant surgical issues.  He did though develop some visual disturbances that he relates started about 1 day prior to the surgery.  He describes them as transient in nature, a surrounding border, and difficulty visualizing in the center.  It is bilateral and no associated pain, conjunctival changes.  No history of same.  No fever.  Some blurriness.  Past Medical History  Diagnosis Date  . Hypertension   . Leg DVT (deep venous thromboembolism), chronic   . Arthritis   . HIV infection   . Carotid artery occlusion     40-60% right ICA stenosis (09/2008)  . Hyperlipidemia   . Coronary artery disease     s/p CABG in 2010  . Myocardial infarction   . Stroke 97    tia  . Cancer     chll-chronic lymphocitic leukemia  . H/O hiatal hernia 08    surgery  . Diabetes mellitus     no med now    Allergies:  Allergies  Allergen Reactions  . Morphine     REACTION: severe headache  . Other Hives    Pecan  . Oxycodone-Acetaminophen     REACTION: headache  . Penicillins     REACTION: red, flushed  . Promethazine Hcl     REACTION: makes him feel drunk    Current antibiotics:   MEDICATIONS:    . antiseptic oral rinse  15 mL Mouth Rinse BID  . atazanavir  300 mg Oral Q breakfast  . atorvastatin  10 mg Oral q1800  . calcium carbonate  2 tablet Oral Daily  . cholecalciferol  1,000 Units Oral Daily  . docusate sodium  100 mg Oral BID  . enoxaparin  30 mg Subcutaneous Q12H  . folic acid  1 mg Oral Daily  . HYDROmorphone PCA 0.3 mg/mL   Intravenous Q4H  . insulin aspart  0-15 Units Subcutaneous TID WC  . lamiVUDine-zidovudine  1 tablet Oral BID  . metoprolol succinate  25 mg Oral Daily  . pregabalin  200 mg Oral BID  .  raltegravir  400 mg Oral BID  . ritonavir  100 mg Oral Daily  . sertraline  50 mg Oral Daily  . valACYclovir  500 mg Oral Daily  . warfarin  12.5 mg Oral ONCE-1800  . warfarin  12.5 mg Oral ONCE-1800  . Warfarin - Pharmacist Dosing Inpatient   Does not apply q1800    History  Substance Use Topics  . Smoking status: Never Smoker   . Smokeless tobacco: Never Used   Comment: occ wine  . Alcohol Use: Yes     occasional wine    History reviewed. No pertinent family history.  Review of Systems - Negative except as per the HPI  OBJECTIVE: Temp:  [98.5 F (36.9 C)-98.6 F (37 C)] 98.6 F (37 C) (03/15 1403) Pulse Rate:  [73-82] 73  (03/15 1403) Resp:  [16-18] 16  (03/15 1403) BP: (100-124)/(57-72) 124/72 mmHg (03/15 1403) SpO2:  [96 %-100 %] 99 % (03/15 1403) General appearance: alert, cooperative and no distress Resp: clear to auscultation bilaterally Cardio: regular rate and rhythm, S1, S2 normal, no murmur, click, rub or gallop GI: soft, non-tender; bowel sounds normal; no masses,  no organomegaly Extremities: extremities normal, atraumatic, no cyanosis or edema HEENT - eye without conjunctivitis, EOMI, normal pupils, no photophobia.   LABS: Results for orders placed during the hospital encounter of 07/22/11 (from the past 48 hour(s))  GLUCOSE, CAPILLARY     Status: Abnormal   Collection Time   07/23/11  4:13 PM      Component Value Range Comment   Glucose-Capillary 136 (*) 70 - 99 (mg/dL)    Comment 1 Notify RN     GLUCOSE, CAPILLARY     Status: Abnormal   Collection Time   07/23/11  9:10 PM      Component Value Range Comment   Glucose-Capillary 107 (*) 70 - 99 (mg/dL)   PROTIME-INR     Status: Abnormal   Collection Time   07/24/11  5:58 AM      Component Value Range Comment   Prothrombin Time 17.0 (*) 11.6 - 15.2 (seconds)    INR 1.36  0.00 - 1.49    CBC     Status: Abnormal   Collection Time   07/24/11  5:58 AM      Component Value Range Comment   WBC 9.9  4.0 -  10.5 (K/uL)    RBC 2.68 (*) 4.22 - 5.81 (MIL/uL)    Hemoglobin 10.2 (*) 13.0 - 17.0 (g/dL)    HCT 16.1 (*) 09.6 - 52.0 (%)    MCV 113.4 (*) 78.0 - 100.0 (fL)    MCH 38.1 (*) 26.0 - 34.0 (pg)    MCHC 33.6  30.0 - 36.0 (g/dL)    RDW 04.5  40.9 - 81.1 (%)    Platelets 144 (*) 150 - 400 (K/uL)   GLUCOSE, CAPILLARY     Status: Normal   Collection Time   07/24/11  6:37 AM      Component Value Range Comment   Glucose-Capillary 91  70 - 99 (mg/dL)   GLUCOSE, CAPILLARY     Status: Normal   Collection Time   07/24/11 11:21 AM      Component Value Range Comment   Glucose-Capillary 83  70 - 99 (mg/dL)   GLUCOSE, CAPILLARY     Status: Abnormal   Collection Time   07/24/11  4:23 PM      Component Value Range Comment   Glucose-Capillary 104 (*) 70 - 99 (mg/dL)    Comment 1 Notify RN     GLUCOSE, CAPILLARY     Status: Abnormal   Collection Time   07/24/11  9:17 PM      Component Value Range Comment   Glucose-Capillary 106 (*) 70 - 99 (mg/dL)    Comment 1 Notify RN     GLUCOSE, CAPILLARY     Status: Normal   Collection Time   07/25/11  6:41 AM      Component Value Range Comment   Glucose-Capillary 96  70 - 99 (mg/dL)   PROTIME-INR     Status: Abnormal   Collection Time   07/25/11  7:30 AM      Component Value Range Comment   Prothrombin Time 19.1 (*) 11.6 - 15.2 (seconds)    INR 1.57 (*) 0.00 - 1.49    CBC     Status: Abnormal   Collection Time   07/25/11  7:30 AM      Component Value Range Comment   WBC 10.0  4.0 - 10.5 (K/uL)    RBC 2.92 (*) 4.22 - 5.81 (MIL/uL)    Hemoglobin 11.0 (*) 13.0 - 17.0 (g/dL)  HCT 32.6 (*) 39.0 - 52.0 (%)    MCV 111.6 (*) 78.0 - 100.0 (fL)    MCH 37.7 (*) 26.0 - 34.0 (pg)    MCHC 33.7  30.0 - 36.0 (g/dL)    RDW 87.5  64.3 - 32.9 (%)    Platelets 159  150 - 400 (K/uL)   GLUCOSE, CAPILLARY     Status: Normal   Collection Time   07/25/11 11:01 AM      Component Value Range Comment   Glucose-Capillary 77  70 - 99 (mg/dL)    Comment 1 Documented in Chart       Comment 2 Notify RN       MICRO:  IMAGING: No results found.  HISTORICAL MICRO/IMAGING  Assessment/Plan:  61 yo hiv patient with visual disturbances.  -It is not likely related to HIV. His CD4 count has been in the normal range and his viral load continues to be at or near undetectable.  Ocular diseases related to HIV predominate when CD4 is low like CMV and are more commonly associated with pain.  Nor does it sound like a uveitis or endophthalmitis.  -consider ophthalmology input.  Thanks, Dr. Orvan Falconer available over the weekend.

## 2011-07-25 NOTE — Discharge Instructions (Signed)
Home Health to be provided by [facility]- [phone]. Start of care 07/30/11.

## 2011-07-25 NOTE — Progress Notes (Signed)
CARE MANAGEMENT NOTE 07/25/2011      Action/Plan:   Discharge planning. Spoke with patient to offer choice. [facility].lPatient lives in [place], [place].  Has rolling walker, 3in1 and CPM at the home.   Anticipated DC Date:  07/27/2011   Anticipated DC Plan:  HOME W HOME HEALTH SERVICES      DC Planning Services  CM consult      [facility] Choice  HOME HEALTH   Choice offered to / List presented to:  C-1 Patient   DME arranged  NA      DME agency  NA     HH arranged  HH-2 PT      HH agency  OTHER - SEE NOTE   Status of service:  Completed, signed off Medicare Important Message given?   (If response is "NO", the following Medicare IM given date fields will be blank) Date Medicare IM given:   Date Additional Medicare IM given:    Discharge Disposition:  HOME W HOME HEALTH SERVICES  Comments:  07/25/11 1354 Vance Peper, RN BSN Case Manager Spoke with [phone]. Home Health may not be available until Wednesday 07/30/11. Case Manager contacted Dr. August Saucer regarding this start date. Per Dr. August Saucer pt should use CPM moore and he will be fine until AWednesday. Relayed this to [facility]. If at all possible they will try to start service on Wednesday. Patient states he does not need HH RN since he monitors and manages his coumadin with his primary  MD. Case Manager relayed this information to Dr. August Saucer. Orders for [facility] have been faxed to [phone] in [place].

## 2011-07-25 NOTE — Progress Notes (Signed)
Occupational Therapy Treatment Patient Details Name: [name] MRN: [id number] DOB: 04-Jul-1950 Today's Date: 07/25/2011  OT Assessment/Plan OT Assessment/Plan Comments on Treatment Session: Pt progressing well OT Plan: Discharge plan remains appropriate Follow Up Recommendations: No OT follow up Equipment Recommended: Rolling walker with 5" wheels;Tub/shower seat (with back)  Pt already owns 3n1 OT Goals ADL Goals ADL Goal: Lower Body Bathing - Progress: Progressing toward goals ADL Goal: Lower Body Dressing - Progress: Progressing toward goals ADL Goal: Tub/Shower Transfer - Progress: Progressing toward goals  OT Treatment Precautions/Restrictions  Precautions Precautions: Knee Restrictions RLE Weight Bearing: Weight bearing as tolerated   ADL ADL Lower Body Dressing: Simulated;Modified independent (Mod I with clothing retrieval. I w, threading and pulling up) Where Assessed - Lower Body Dressing: Sit to stand from chair Toilet Transfer: Performed;Modified independent Toilet Transfer Method: Ambulating Ambulation Related to ADLs: Mod I with RW ambulation. Pt continues with premorbid flexed posture.  ADL Comments: Able to verbalize safe entry and exit from shower with use of RW- pt declining practice at this time. Will have someone present to ensure safety with transfer. Performed sit to stands x 3 pusing from surface with one hand and holding to another steady surface with other hand in prep for sit to stands in shower from tub/shower seat. Pt requiring Supervision for this. Encouraged pt to perform this throughout the day   End of Session OT - End of Session Equipment Utilized During Treatment: Gait belt General Behavior During Session: [facility] for tasks performed Cognition: [facility] for tasks performed  Tonya Carlile  07/25/2011, 2:11 PM

## 2011-07-26 LAB — GLUCOSE, CAPILLARY
Glucose-Capillary: 90 mg/dL (ref 70–99)
Glucose-Capillary: 86 mg/dL (ref 70–99)
Glucose-Capillary: 105 mg/dL — ABNORMAL HIGH (ref 70–99)
Glucose-Capillary: 122 mg/dL — ABNORMAL HIGH (ref 70–99)

## 2011-07-26 LAB — PROTIME-INR
INR: 1.83 — ABNORMAL HIGH (ref 0.00–1.49)
Prothrombin Time: 21.5 seconds — ABNORMAL HIGH (ref 11.6–15.2)

## 2011-07-26 MED ORDER — WARFARIN SODIUM 10 MG PO TABS
10.0000 mg | ORAL_TABLET | Freq: Once | ORAL | Status: AC
  Administered 2011-07-26: 10 mg via ORAL
  Filled 2011-07-26: qty 1

## 2011-07-26 NOTE — Progress Notes (Signed)
Patient ID: [name], male   DOB: Oct 31, 1950, 61 y.o.   MRN: [id number] No acute changes.  Looks good overall.  Plan for D/C to home tomorrow.

## 2011-07-26 NOTE — Progress Notes (Signed)
Physical Therapy Treatment Note   07/26/11 1300  PT Visit Information  Last PT Received On 07/26/11  Precautions  Precautions Knee  Restrictions  RLE Weight Bearing WBAT  Bed Mobility  Supine to Sit 6: Modified independent (Device/Increase time)  Sit to Supine - Details (indicate cue type and reason) 6  Transfers  Sit to Stand 6: Modified independent (Device/Increase time)  Sit to Stand Details (indicate cue type and reason) safe use of walker  Stand to Sit 6: Modified independent (Device/Increase time)  Ambulation/Gait  Ambulation/Gait Assistance 5: Supervision  Ambulation/Gait Assistance Details (indicate cue type and reason) pt con't to have increased trunk flexion due to h/o of multiple back problems/surgeries  Ambulation Distance (Feet) 350 Feet  Assistive device Rolling walker  Gait Pattern Step-through pattern;Decreased step length - right;Decreased stance time - left;Antalgic  Stairs Assistance 5: Supervision  Stairs Assistance Details (indicate cue type and reason) reminder verbal cues for sequencing "up with the good, down with the bad", pt with incresaed ease this date  Stair Management Technique Two rails  Number of Stairs 4   Total Joint Exercises  Long Arc Quad AROM;Right;20 reps;Seated  Knee Flexion AROM;20 reps;Standing;Right (leaned forward into lunge position)  PT - End of Session  Activity Tolerance Patient tolerated treatment well  Patient left in bed;in CPM;with call bell in reach (CPM 0-100 degrees)  General  Behavior During Session [facility] for tasks performed  Cognition [facility] for tasks performed  PT - Assessment/Plan  Comments on Treatment Session Patient demonstrates significant progress towards all goals. Patient mod I with all mobility except stair negotiation. Patient safe to return hom when approved by MD. Patient with improved R LE strength and ROM.  PT Plan Discharge plan remains appropriate  Follow Up Recommendations No PT follow up  Equipment  Recommended Rolling walker with 5" wheels;Tub/shower bench  Acute Rehab PT Goals  Pt will go Supine/Side to Sit Independently  PT Goal: Supine/Side to Sit - Progress Updated due to goal met  Pt will go Sit to Supine/Side Independently  PT Goal: Sit to Supine/Side - Progress Updated due to goal met  Pt will go Sit to Stand Independently  PT Goal: Sit to Stand - Progress Updated due to goal met  Pt will go Stand to Sit Independently  PT Goal: Stand to Sit - Progress Updated due to goals met  Pt will Ambulate with modified independence;with rolling walker  PT Goal: Ambulate - Progress Updated due to goal met  Pt will Go Up / Down Stairs 3-5 stairs;Independently;with rail(s)  PT Goal: Up/Down Stairs - Progress Updated due to goal met  PT Goal: Perform Home Exercise Program - Progress Met    Pain: 2/10 R knee surgical pain. Patient c/o "I just don't feel good today."  Lewis Shock, PT, DPT Pager #: [phone] Office #: [phone]

## 2011-07-27 LAB — PROTIME-INR
INR: 1.91 — ABNORMAL HIGH (ref 0.00–1.49)
Prothrombin Time: 22.2 seconds — ABNORMAL HIGH (ref 11.6–15.2)

## 2011-07-27 LAB — GLUCOSE, CAPILLARY
Glucose-Capillary: 107 mg/dL — ABNORMAL HIGH (ref 70–99)
Glucose-Capillary: 122 mg/dL — ABNORMAL HIGH (ref 70–99)

## 2011-07-27 MED ORDER — WARFARIN SODIUM 2.5 MG PO TABS
12.5000 mg | ORAL_TABLET | Freq: Once | ORAL | Status: DC
  Filled 2011-07-27: qty 1

## 2011-07-27 NOTE — Progress Notes (Signed)
ANTICOAGULATION CONSULT NOTE - Follow Up Consult  Pharmacy Consult for Coumadin  Indication: VTE prophylaxis s/p R TKA  Allergies  Allergen Reactions  . Morphine     REACTION: severe headache  . Other Hives    Pecan  . Oxycodone-Acetaminophen     REACTION: headache  . Penicillins     REACTION: red, flushed  . Promethazine Hcl     REACTION: makes him feel drunk    Patient Measurements: Height: 5\' 9"  (175.3 cm) Weight: 188 lb 7.9 oz (85.5 kg) IBW/kg (Calculated) : 70.7   Vital Signs: Temp: 98.7 F (37.1 C) (03/17 0545) Temp src: Oral (03/17 0545) BP: 101/52 mmHg (03/17 0545) Pulse Rate: 82  (03/17 0545)  Labs:  Alvira Philips 07/27/11 0605 07/26/11 0731 07/25/11 0730  HGB -- -- 11.0*  HCT -- -- 32.6*  PLT -- -- 159  APTT -- -- --  LABPROT 22.2* 21.5* 19.1*  INR 1.91* 1.83* 1.57*  HEPARINUNFRC -- -- --  CREATININE -- -- --  CKTOTAL -- -- --  CKMB -- -- --  TROPONINI -- -- --   Estimated Creatinine Clearance: 102.4 ml/min (by C-G formula based on Cr of 0.66).   Medications:  Scheduled:     . antiseptic oral rinse  15 mL Mouth Rinse BID  . atazanavir  300 mg Oral Q breakfast  . atorvastatin  10 mg Oral q1800  . calcium carbonate  2 tablet Oral Daily  . cholecalciferol  1,000 Units Oral Daily  . docusate sodium  100 mg Oral BID  . enoxaparin  30 mg Subcutaneous Q12H  . folic acid  1 mg Oral Daily  . insulin aspart  0-15 Units Subcutaneous TID WC  . lamiVUDine-zidovudine  1 tablet Oral BID  . metoprolol succinate  25 mg Oral Daily  . pregabalin  200 mg Oral BID  . raltegravir  400 mg Oral BID  . ritonavir  100 mg Oral Daily  . sertraline  50 mg Oral Daily  . valACYclovir  500 mg Oral Daily  . warfarin  10 mg Oral ONCE-1800  . Warfarin - Pharmacist Dosing Inpatient   Does not apply q1800    Assessment: 61 y.o. M to restart coumadin post-op for recurrent LLE DVT and VTE prophylaxis s/p R TKA. INR this morning is SUBtheraeutic (INR 1.91 << 1.83, goal of 2-3).  The patient's home dose was 10 mg daily. The patient is also on lovenox 30mg  SQ q12h (DVT prophylactic dose) until INR >/= 2. Noted for likely d/c today.  Goal of Therapy:  INR 2-3   Plan:  1. Warfarin 12.5 mg x 1 dose at 1800 today 2. Will continue to monitor for any signs/symptoms of bleeding and will follow up with PT/INR in the a.m.   Harland German, Pharm D 07/27/2011 10:31 AM

## 2011-07-27 NOTE — Discharge Summary (Signed)
Patient ID: [name] MRN: [id number] DOB/AGE: [birth year] 61 y.o.  Admit date: 07/22/2011 Discharge date: 07/27/2011  Admission Diagnoses:  Active Problems:  * No active hospital problems. *    Discharge Diagnoses:  Same  Past Medical History  Diagnosis Date  . Hypertension   . Leg DVT (deep venous thromboembolism), chronic   . Arthritis   . HIV infection   . Carotid artery occlusion     40-60% right ICA stenosis (09/2008)  . Hyperlipidemia   . Coronary artery disease     s/p CABG in 2010  . Myocardial infarction   . Stroke 97    tia  . Cancer     chll-chronic lymphocitic leukemia  . H/O hiatal hernia 08    surgery  . Diabetes mellitus     no med now    Surgeries: Procedure(s): COMPUTER ASSISTED TOTAL KNEE ARTHROPLASTY on 07/22/2011   Consultants:    Discharged Condition: Improved  Hospital Course: [name] is an 61 y.o. male who was admitted 07/22/2011 for operative treatment of<principal problem not specified>. Patient has severe unremitting pain that affects sleep, daily activities, and work/hobbies. After pre-op clearance the patient was taken to the operating room on 07/22/2011 and underwent  Procedure(s): COMPUTER ASSISTED TOTAL KNEE ARTHROPLASTY.    Patient was given perioperative antibiotics: Anti-infectives     Start     Dose/Rate Route Frequency Ordered Stop   07/23/11 0800   atazanavir (REYATAZ) capsule 300 mg        300 mg Oral Daily with breakfast 07/22/11 1233     07/22/11 1400  lamiVUDine-zidovudine (COMBIVIR) 150-300 MG per tablet 1 tablet       1 tablet Oral 2 times daily 07/22/11 1233     07/22/11 1400   raltegravir (ISENTRESS) tablet 400 mg        400 mg Oral 2 times daily 07/22/11 1233     07/22/11 1400   ritonavir (NORVIR) capsule 100 mg        100 mg Oral Daily 07/22/11 1233     07/22/11 1400   valACYclovir (VALTREX) tablet 500 mg        500 mg Oral Daily 07/22/11 1233     07/22/11 1400   clindamycin (CLEOCIN) IVPB 600 mg         600 mg 100 mL/hr over 30 Minutes Intravenous 3 times per day 07/22/11 1233 07/23/11 0632   07/21/11 1430   clindamycin (CLEOCIN) IVPB 600 mg        600 mg 100 mL/hr over 30 Minutes Intravenous 60 min pre-op 07/21/11 1419 07/22/11 0748           Patient was given sequential compression devices, early ambulation, and chemoprophylaxis to prevent DVT.  Patient benefited maximally from hospital stay and there were no complications.    Recent vital signs: Patient Vitals for the past 24 hrs:  BP Temp Temp src Pulse Resp SpO2  07/27/11 0545 101/52 mmHg 98.7 F (37.1 C) Oral 82  20  96 %  17-Aug-2011 2050 111/71 mmHg 99.5 F (37.5 C) Oral 93  20  97 %  08-17-2011 1444 103/58 mmHg 98.5 F (36.9 C) Oral 82  18  97 %     Recent laboratory studies:  Basename 07/27/11 0605 2011/08/17 0731 07/25/11 0730  WBC -- -- 10.0  HGB -- -- 11.0*  HCT -- -- 32.6*  PLT -- -- 159  NA -- -- --  K -- -- --  CL -- -- --  CO2 -- -- --  BUN -- -- --  CREATININE -- -- --  GLUCOSE -- -- --  INR 1.91* 1.83* --  CALCIUM -- -- --     Discharge Medications:   Medication List  As of 07/27/2011 12:08 PM   STOP taking these medications         colchicine 0.6 MG tablet         TAKE these medications         aspirin 81 MG chewable tablet   Chew 81 mg by mouth daily.      atazanavir 300 MG capsule   Commonly known as: REYATAZ   Take 300 mg by mouth daily with breakfast.      atorvastatin 10 MG tablet   Commonly known as: LIPITOR   Take 1 tablet (10 mg total) by mouth daily.      calcium carbonate 600 MG Tabs   Commonly known as: OS-CAL   Take 1,200 mg by mouth daily.      cholecalciferol 1000 UNITS tablet   Commonly known as: VITAMIN D   Take 1,000 Units by mouth daily.      folic acid 1 MG tablet   Commonly known as: FOLVITE   Take 1 mg by mouth daily.      HYDROmorphone 4 MG tablet   Commonly known as: DILAUDID   Take 1 tablet (4 mg total) by mouth every 3 (three) hours as needed.        lamiVUDine-zidovudine 150-300 MG per tablet   Commonly known as: COMBIVIR   Take 1 tablet by mouth 2 (two) times daily.      methocarbamol 500 MG tablet   Commonly known as: ROBAXIN   Take 1 tablet (500 mg total) by mouth every 6 (six) hours as needed.      methotrexate 2.5 MG tablet   Commonly known as: RHEUMATREX   Take 2.5 mg by mouth once a week. Caution:Chemotherapy. Protect from light.  Take 5 tablets once a week on Wednesdays      metoprolol succinate 25 MG 24 hr tablet   Commonly known as: TOPROL-XL   Take 1 tablet (25 mg total) by mouth daily.      pregabalin 200 MG capsule   Commonly known as: LYRICA   Take 200 mg by mouth 2 (two) times daily.      raltegravir 400 MG tablet   Commonly known as: ISENTRESS   Take 400 mg by mouth 2 (two) times daily.      ritonavir 100 MG capsule   Commonly known as: NORVIR   Take 100 mg by mouth 1 day or 1 dose.      sertraline 50 MG tablet   Commonly known as: ZOLOFT   Take 50 mg by mouth daily.      tadalafil 20 MG tablet   Commonly known as: CIALIS   Take 10 mg by mouth daily as needed. Take one half tablet as needed      valACYclovir 500 MG tablet   Commonly known as: VALTREX   Take 500 mg by mouth daily.      warfarin 10 MG tablet   Commonly known as: COUMADIN   Take 10 mg by mouth daily. Managed by Dr. Carilyn Goodpasture Koirala            Diagnostic Studies: Dg Chest 2 View  07/11/2011  *RADIOLOGY REPORT*  Clinical Data: Preop  CHEST - 2 VIEW  Comparison: 10/27/2008  Findings: Cardiomediastinal silhouette is stable.  No acute infiltrate or pleural effusion.  No pulmonary edema.  Status post CABG again noted.  Stable degenerative changes thoracic spine. Stable calcified granuloma in the right upper lobe.  Metallic anchors right humeral head again noted.  IMPRESSION: No active disease.  No significant change.  Original Report Authenticated By: Natasha Mead, M.D.   X-ray Knee Right Port  07/22/2011  *RADIOLOGY REPORT*   Clinical Data: Postop right hip replacement  PORTABLE RIGHT KNEE - 1-2 VIEW  Comparison: None.  Findings: Two portable views of the right hip show the femoral and tibial components to be in good position and alignment.  No complicating features are seen.  Some air is noted in the soft tissues postoperatively.  IMPRESSION: Right total right hip replacement in good position and alignment.  Original Report Authenticated By: Juline Patch, M.D.    Disposition: 01-Home or Self Care  Discharge Orders    Future Appointments: Provider: Department: Dept Phone: Center:   08/07/2011 10:00 AM Gardiner Barefoot, MD [facility] [id number] [facility]     Future Orders Please Complete By Expires   Diet - low sodium heart healthy      Call MD / Call 911      Comments:   If you experience chest pain or shortness of breath, CALL 911 and be transported to the hospital emergency room.  If you develope a fever above 101 F, pus (white drainage) or increased drainage or redness at the wound, or calf pain, call your surgeon's office.   Constipation Prevention      Comments:   Drink plenty of fluids.  Prune juice may be helpful.  You may use a stool softener, such as Colace (over the counter) 100 mg twice a day.  Use MiraLax (over the counter) for constipation as needed.   Increase activity slowly as tolerated      Weight Bearing as taught in Physical Therapy      Comments:   Use a walker or crutches as instructed.   Discharge instructions      Comments:   1. CPM 6 hours per day 2. Keep incision dry 3.Mobilize as much as tolerated         Signed: Kathryne Hitch 07/27/2011, 12:08 PM

## 2011-07-28 ENCOUNTER — Encounter (HOSPITAL_COMMUNITY): Payer: Self-pay | Admitting: Orthopedic Surgery

## 2011-07-29 DIAGNOSIS — B20 Human immunodeficiency virus [HIV] disease: Secondary | ICD-10-CM | POA: Diagnosis not present

## 2011-07-29 DIAGNOSIS — C9110 Chronic lymphocytic leukemia of B-cell type not having achieved remission: Secondary | ICD-10-CM | POA: Diagnosis not present

## 2011-07-29 DIAGNOSIS — R269 Unspecified abnormalities of gait and mobility: Secondary | ICD-10-CM | POA: Diagnosis not present

## 2011-07-29 DIAGNOSIS — E119 Type 2 diabetes mellitus without complications: Secondary | ICD-10-CM | POA: Diagnosis not present

## 2011-07-29 DIAGNOSIS — I10 Essential (primary) hypertension: Secondary | ICD-10-CM | POA: Diagnosis not present

## 2011-07-29 DIAGNOSIS — Z471 Aftercare following joint replacement surgery: Secondary | ICD-10-CM | POA: Diagnosis not present

## 2011-07-30 DIAGNOSIS — C9110 Chronic lymphocytic leukemia of B-cell type not having achieved remission: Secondary | ICD-10-CM | POA: Diagnosis not present

## 2011-07-30 DIAGNOSIS — I10 Essential (primary) hypertension: Secondary | ICD-10-CM | POA: Diagnosis not present

## 2011-07-30 DIAGNOSIS — E119 Type 2 diabetes mellitus without complications: Secondary | ICD-10-CM | POA: Diagnosis not present

## 2011-07-30 DIAGNOSIS — B20 Human immunodeficiency virus [HIV] disease: Secondary | ICD-10-CM | POA: Diagnosis not present

## 2011-07-30 DIAGNOSIS — R269 Unspecified abnormalities of gait and mobility: Secondary | ICD-10-CM | POA: Diagnosis not present

## 2011-07-30 DIAGNOSIS — Z471 Aftercare following joint replacement surgery: Secondary | ICD-10-CM | POA: Diagnosis not present

## 2011-08-01 DIAGNOSIS — E119 Type 2 diabetes mellitus without complications: Secondary | ICD-10-CM | POA: Diagnosis not present

## 2011-08-01 DIAGNOSIS — B20 Human immunodeficiency virus [HIV] disease: Secondary | ICD-10-CM | POA: Diagnosis not present

## 2011-08-01 DIAGNOSIS — I10 Essential (primary) hypertension: Secondary | ICD-10-CM | POA: Diagnosis not present

## 2011-08-01 DIAGNOSIS — Z471 Aftercare following joint replacement surgery: Secondary | ICD-10-CM | POA: Diagnosis not present

## 2011-08-01 DIAGNOSIS — R269 Unspecified abnormalities of gait and mobility: Secondary | ICD-10-CM | POA: Diagnosis not present

## 2011-08-01 DIAGNOSIS — C9110 Chronic lymphocytic leukemia of B-cell type not having achieved remission: Secondary | ICD-10-CM | POA: Diagnosis not present

## 2011-08-04 DIAGNOSIS — M1710 Unilateral primary osteoarthritis, unspecified knee: Secondary | ICD-10-CM | POA: Diagnosis not present

## 2011-08-05 DIAGNOSIS — E119 Type 2 diabetes mellitus without complications: Secondary | ICD-10-CM | POA: Diagnosis not present

## 2011-08-05 DIAGNOSIS — R269 Unspecified abnormalities of gait and mobility: Secondary | ICD-10-CM | POA: Diagnosis not present

## 2011-08-05 DIAGNOSIS — C9110 Chronic lymphocytic leukemia of B-cell type not having achieved remission: Secondary | ICD-10-CM | POA: Diagnosis not present

## 2011-08-05 DIAGNOSIS — Z471 Aftercare following joint replacement surgery: Secondary | ICD-10-CM | POA: Diagnosis not present

## 2011-08-05 DIAGNOSIS — I10 Essential (primary) hypertension: Secondary | ICD-10-CM | POA: Diagnosis not present

## 2011-08-05 DIAGNOSIS — B20 Human immunodeficiency virus [HIV] disease: Secondary | ICD-10-CM | POA: Diagnosis not present

## 2011-08-07 ENCOUNTER — Encounter: Payer: Self-pay | Admitting: Internal Medicine

## 2011-08-07 ENCOUNTER — Ambulatory Visit (INDEPENDENT_AMBULATORY_CARE_PROVIDER_SITE_OTHER): Payer: Medicare Other | Admitting: Internal Medicine

## 2011-08-07 VITALS — BP 135/76 | HR 69 | Temp 97.7°F | Wt 187.5 lb

## 2011-08-07 DIAGNOSIS — L570 Actinic keratosis: Secondary | ICD-10-CM | POA: Diagnosis not present

## 2011-08-07 DIAGNOSIS — B20 Human immunodeficiency virus [HIV] disease: Secondary | ICD-10-CM | POA: Diagnosis not present

## 2011-08-07 DIAGNOSIS — L819 Disorder of pigmentation, unspecified: Secondary | ICD-10-CM | POA: Diagnosis not present

## 2011-08-07 NOTE — Progress Notes (Signed)
  Subjective:    Patient ID: [name], male    DOB: 04/08/1951, 61 y.o.   MRN: [id number]  HPI 61 yo with HIV and recently underwent successful right TKA here for hiv follow up.  He has been doing well with his knee and good movement, less pain.  Continues to take his ARVs without missed doses.  He does complain of worsening fat redistribution and gynecomastia.  He previously required a surgical breast reduction 7 or 8 years ago.      Review of Systems  Constitutional: Negative for fever, chills, appetite change and unexpected weight change.  HENT: Negative for sore throat and trouble swallowing.   Respiratory: Negative for cough, chest tightness and shortness of breath.   Cardiovascular: Negative for chest pain, palpitations and leg swelling.  Gastrointestinal: Negative for nausea, abdominal pain and diarrhea.  Musculoskeletal: Negative for myalgias and arthralgias.  Skin: Negative for pallor and rash.  Neurological: Negative for dizziness and headaches.  Hematological: Negative for adenopathy.  Psychiatric/Behavioral: Negative for dysphoric mood. The patient is not nervous/anxious.        Objective:   Physical Exam  Constitutional: He appears well-developed and well-nourished. No distress.  HENT:  Mouth/Throat: Oropharynx is clear and moist. No oropharyngeal exudate.  Cardiovascular: Normal rate, regular rhythm and normal heart sounds.  Exam reveals no gallop and no friction rub.   No murmur heard. Pulmonary/Chest: Breath sounds normal. No respiratory distress. He has no wheezes. He has no rales.       + gynecomastia  Abdominal: Soft. Bowel sounds are normal. He exhibits no distension. There is no tenderness. There is no rebound.  Lymphadenopathy:    He has no cervical adenopathy.          Assessment & Plan:

## 2011-08-08 NOTE — Assessment & Plan Note (Addendum)
He continues to do well, no new issues.  RTC 4 months  Will consider tesamorilin

## 2011-08-12 ENCOUNTER — Telehealth: Payer: Self-pay | Admitting: *Deleted

## 2011-08-12 NOTE — Telephone Encounter (Signed)
States he has been taking 2 per day.when he ran out & went to his pharmacy, they would not fill as medicare will not pay this early. They told him the dose is 1 per day. I have 2 sample bottles that I told him he can have lot [id number], expires 9/13. The other is lot [id number], expires 03/2012. He will drive here from [place] to pick them up

## 2011-08-25 ENCOUNTER — Telehealth: Payer: Self-pay | Admitting: *Deleted

## 2011-08-25 DIAGNOSIS — B20 Human immunodeficiency virus [HIV] disease: Secondary | ICD-10-CM

## 2011-08-25 NOTE — Telephone Encounter (Signed)
Ok to refill 

## 2011-08-25 NOTE — Telephone Encounter (Signed)
Pt requesting prescription for Lyrica from Dr. Luciana Axe.  Not originally prescribed by this practice.  Call [facility] to [facility], [address], [place], [place], as above.  MD please advise.

## 2011-08-27 MED ORDER — PREGABALIN 200 MG PO CAPS: 200.0000 mg | ORAL_CAPSULE | Freq: Two times a day (BID) | ORAL | Status: DC

## 2011-08-27 NOTE — Telephone Encounter (Signed)
Phone call to pt.  Message left that rx was called in to pharmacy.

## 2011-08-28 DIAGNOSIS — G609 Hereditary and idiopathic neuropathy, unspecified: Secondary | ICD-10-CM | POA: Diagnosis not present

## 2011-08-28 DIAGNOSIS — T7840XA Allergy, unspecified, initial encounter: Secondary | ICD-10-CM | POA: Diagnosis not present

## 2011-08-28 DIAGNOSIS — E785 Hyperlipidemia, unspecified: Secondary | ICD-10-CM | POA: Diagnosis not present

## 2011-08-28 DIAGNOSIS — Z125 Encounter for screening for malignant neoplasm of prostate: Secondary | ICD-10-CM | POA: Diagnosis not present

## 2011-08-28 DIAGNOSIS — Z0000 Encounter for general adult medical examination without abnormal findings: Secondary | ICD-10-CM | POA: Diagnosis not present

## 2011-09-09 DIAGNOSIS — L851 Acquired keratosis [keratoderma] palmaris et plantaris: Secondary | ICD-10-CM | POA: Diagnosis not present

## 2011-09-09 DIAGNOSIS — I70209 Unspecified atherosclerosis of native arteries of extremities, unspecified extremity: Secondary | ICD-10-CM | POA: Diagnosis not present

## 2011-09-09 DIAGNOSIS — L609 Nail disorder, unspecified: Secondary | ICD-10-CM | POA: Diagnosis not present

## 2011-09-09 DIAGNOSIS — Z79899 Other long term (current) drug therapy: Secondary | ICD-10-CM | POA: Diagnosis not present

## 2011-09-15 DIAGNOSIS — Z79899 Other long term (current) drug therapy: Secondary | ICD-10-CM | POA: Diagnosis not present

## 2011-09-16 DIAGNOSIS — M79609 Pain in unspecified limb: Secondary | ICD-10-CM | POA: Diagnosis not present

## 2011-09-16 DIAGNOSIS — L600 Ingrowing nail: Secondary | ICD-10-CM | POA: Diagnosis not present

## 2011-09-26 DIAGNOSIS — J310 Chronic rhinitis: Secondary | ICD-10-CM | POA: Diagnosis not present

## 2011-09-26 DIAGNOSIS — J309 Allergic rhinitis, unspecified: Secondary | ICD-10-CM | POA: Diagnosis not present

## 2011-09-26 DIAGNOSIS — H9120 Sudden idiopathic hearing loss, unspecified ear: Secondary | ICD-10-CM | POA: Diagnosis not present

## 2011-09-30 DIAGNOSIS — M79609 Pain in unspecified limb: Secondary | ICD-10-CM | POA: Diagnosis not present

## 2011-09-30 DIAGNOSIS — L255 Unspecified contact dermatitis due to plants, except food: Secondary | ICD-10-CM | POA: Diagnosis not present

## 2011-09-30 DIAGNOSIS — L600 Ingrowing nail: Secondary | ICD-10-CM | POA: Diagnosis not present

## 2011-10-10 DIAGNOSIS — J310 Chronic rhinitis: Secondary | ICD-10-CM | POA: Diagnosis not present

## 2011-10-10 DIAGNOSIS — H905 Unspecified sensorineural hearing loss: Secondary | ICD-10-CM | POA: Diagnosis not present

## 2011-10-10 DIAGNOSIS — T17308A Unspecified foreign body in larynx causing other injury, initial encounter: Secondary | ICD-10-CM | POA: Diagnosis not present

## 2011-10-10 DIAGNOSIS — Z21 Asymptomatic human immunodeficiency virus [HIV] infection status: Secondary | ICD-10-CM | POA: Diagnosis not present

## 2011-10-14 ENCOUNTER — Other Ambulatory Visit: Payer: Self-pay | Admitting: Neurosurgery

## 2011-10-14 DIAGNOSIS — M549 Dorsalgia, unspecified: Secondary | ICD-10-CM

## 2011-10-14 DIAGNOSIS — M5030 Other cervical disc degeneration, unspecified cervical region: Secondary | ICD-10-CM | POA: Diagnosis not present

## 2011-10-14 DIAGNOSIS — M545 Low back pain, unspecified: Secondary | ICD-10-CM

## 2011-10-14 DIAGNOSIS — M48061 Spinal stenosis, lumbar region without neurogenic claudication: Secondary | ICD-10-CM | POA: Diagnosis not present

## 2011-10-14 DIAGNOSIS — M542 Cervicalgia: Secondary | ICD-10-CM

## 2011-10-14 DIAGNOSIS — M546 Pain in thoracic spine: Secondary | ICD-10-CM | POA: Diagnosis not present

## 2011-10-15 ENCOUNTER — Ambulatory Visit
Admission: RE | Admit: 2011-10-15 | Discharge: 2011-10-15 | Disposition: A | Discharge status: Home / Self Care | Payer: Medicare Other | Source: Ambulatory Visit | Attending: Neurosurgery | Admitting: Neurosurgery

## 2011-10-15 DIAGNOSIS — Z79899 Other long term (current) drug therapy: Secondary | ICD-10-CM | POA: Diagnosis not present

## 2011-10-15 DIAGNOSIS — M5020 Other cervical disc displacement, unspecified cervical region: Secondary | ICD-10-CM | POA: Diagnosis not present

## 2011-10-15 DIAGNOSIS — M549 Dorsalgia, unspecified: Secondary | ICD-10-CM

## 2011-10-15 DIAGNOSIS — M542 Cervicalgia: Secondary | ICD-10-CM

## 2011-10-15 DIAGNOSIS — M545 Low back pain, unspecified: Secondary | ICD-10-CM

## 2011-10-15 DIAGNOSIS — M130 Polyarthritis, unspecified: Secondary | ICD-10-CM | POA: Diagnosis not present

## 2011-10-15 DIAGNOSIS — M5030 Other cervical disc degeneration, unspecified cervical region: Secondary | ICD-10-CM | POA: Diagnosis not present

## 2011-10-15 DIAGNOSIS — M159 Polyosteoarthritis, unspecified: Secondary | ICD-10-CM | POA: Diagnosis not present

## 2011-10-15 DIAGNOSIS — M47817 Spondylosis without myelopathy or radiculopathy, lumbosacral region: Secondary | ICD-10-CM | POA: Diagnosis not present

## 2011-10-15 DIAGNOSIS — M069 Rheumatoid arthritis, unspecified: Secondary | ICD-10-CM | POA: Diagnosis not present

## 2011-10-15 DIAGNOSIS — M47812 Spondylosis without myelopathy or radiculopathy, cervical region: Secondary | ICD-10-CM | POA: Diagnosis not present

## 2011-10-15 DIAGNOSIS — M47814 Spondylosis without myelopathy or radiculopathy, thoracic region: Secondary | ICD-10-CM | POA: Diagnosis not present

## 2011-10-16 ENCOUNTER — Other Ambulatory Visit: Payer: Self-pay | Admitting: Neurosurgery

## 2011-10-16 DIAGNOSIS — M546 Pain in thoracic spine: Secondary | ICD-10-CM

## 2011-10-16 DIAGNOSIS — M542 Cervicalgia: Secondary | ICD-10-CM

## 2011-10-16 DIAGNOSIS — M545 Low back pain, unspecified: Secondary | ICD-10-CM

## 2011-11-06 DIAGNOSIS — M47817 Spondylosis without myelopathy or radiculopathy, lumbosacral region: Secondary | ICD-10-CM | POA: Diagnosis not present

## 2011-11-06 DIAGNOSIS — M48061 Spinal stenosis, lumbar region without neurogenic claudication: Secondary | ICD-10-CM | POA: Diagnosis not present

## 2011-11-06 DIAGNOSIS — M4712 Other spondylosis with myelopathy, cervical region: Secondary | ICD-10-CM | POA: Diagnosis not present

## 2011-11-18 ENCOUNTER — Encounter: Payer: Self-pay | Admitting: Internal Medicine

## 2011-11-18 ENCOUNTER — Ambulatory Visit (INDEPENDENT_AMBULATORY_CARE_PROVIDER_SITE_OTHER): Payer: Medicare Other | Admitting: Internal Medicine

## 2011-11-18 VITALS — BP 107/62 | HR 89 | Temp 97.8°F | Ht 69.0 in | Wt 182.0 lb

## 2011-11-18 DIAGNOSIS — B20 Human immunodeficiency virus [HIV] disease: Secondary | ICD-10-CM

## 2011-11-18 DIAGNOSIS — N62 Hypertrophy of breast: Secondary | ICD-10-CM

## 2011-11-18 LAB — COMPREHENSIVE METABOLIC PANEL
ALT: 23 U/L (ref 0–53)
AST: 28 U/L (ref 0–37)
Albumin: 3.9 g/dL (ref 3.5–5.2)
Alkaline Phosphatase: 92 U/L (ref 39–117)
BUN: 19 mg/dL (ref 6–23)
CO2: 30 mEq/L (ref 19–32)
Calcium: 9 mg/dL (ref 8.4–10.5)
Chloride: 109 mEq/L (ref 96–112)
Creat: 0.87 mg/dL (ref 0.50–1.35)
Glucose, Bld: 76 mg/dL (ref 70–99)
Potassium: 4 mEq/L (ref 3.5–5.3)
Sodium: 145 mEq/L (ref 135–145)
Total Bilirubin: 1.4 mg/dL — ABNORMAL HIGH (ref 0.3–1.2)
Total Protein: 5.5 g/dL — ABNORMAL LOW (ref 6.0–8.3)

## 2011-11-18 LAB — CBC WITH DIFFERENTIAL/PLATELET
Basophils Absolute: 0 10*3/uL (ref 0.0–0.1)
Basophils Relative: 0 % (ref 0–1)
Eosinophils Absolute: 0.2 10*3/uL (ref 0.0–0.7)
Eosinophils Relative: 3 % (ref 0–5)
HCT: 35.4 % — ABNORMAL LOW (ref 39.0–52.0)
Hemoglobin: 12.3 g/dL — ABNORMAL LOW (ref 13.0–17.0)
Lymphocytes Relative: 60 % — ABNORMAL HIGH (ref 12–46)
Lymphs Abs: 3.9 10*3/uL (ref 0.7–4.0)
MCH: 38.1 pg — ABNORMAL HIGH (ref 26.0–34.0)
MCHC: 34.7 g/dL (ref 30.0–36.0)
MCV: 109.6 fL — ABNORMAL HIGH (ref 78.0–100.0)
Monocytes Absolute: 0.6 10*3/uL (ref 0.1–1.0)
Monocytes Relative: 9 % (ref 3–12)
Neutro Abs: 1.9 10*3/uL (ref 1.7–7.7)
Neutrophils Relative %: 28 % — ABNORMAL LOW (ref 43–77)
Platelets: 172 10*3/uL (ref 150–400)
RBC: 3.23 MIL/uL — ABNORMAL LOW (ref 4.22–5.81)
RDW: 17 % — ABNORMAL HIGH (ref 11.5–15.5)
WBC: 6.6 10*3/uL (ref 4.0–10.5)

## 2011-11-19 DIAGNOSIS — S0003XA Contusion of scalp, initial encounter: Secondary | ICD-10-CM | POA: Diagnosis not present

## 2011-11-19 DIAGNOSIS — Z951 Presence of aortocoronary bypass graft: Secondary | ICD-10-CM | POA: Diagnosis not present

## 2011-11-19 DIAGNOSIS — Z21 Asymptomatic human immunodeficiency virus [HIV] infection status: Secondary | ICD-10-CM | POA: Diagnosis not present

## 2011-11-19 DIAGNOSIS — N62 Hypertrophy of breast: Secondary | ICD-10-CM | POA: Insufficient documentation

## 2011-11-19 DIAGNOSIS — Z79899 Other long term (current) drug therapy: Secondary | ICD-10-CM | POA: Diagnosis not present

## 2011-11-19 DIAGNOSIS — Z7982 Long term (current) use of aspirin: Secondary | ICD-10-CM | POA: Diagnosis not present

## 2011-11-19 DIAGNOSIS — S1093XA Contusion of unspecified part of neck, initial encounter: Secondary | ICD-10-CM | POA: Diagnosis not present

## 2011-11-19 DIAGNOSIS — M549 Dorsalgia, unspecified: Secondary | ICD-10-CM | POA: Diagnosis not present

## 2011-11-19 DIAGNOSIS — R51 Headache: Secondary | ICD-10-CM | POA: Diagnosis not present

## 2011-11-19 DIAGNOSIS — Z7901 Long term (current) use of anticoagulants: Secondary | ICD-10-CM | POA: Diagnosis not present

## 2011-11-19 DIAGNOSIS — S0083XA Contusion of other part of head, initial encounter: Secondary | ICD-10-CM | POA: Diagnosis not present

## 2011-11-19 DIAGNOSIS — M5090 Cervical disc disorder, unspecified, unspecified cervical region: Secondary | ICD-10-CM | POA: Diagnosis not present

## 2011-11-19 DIAGNOSIS — M47812 Spondylosis without myelopathy or radiculopathy, cervical region: Secondary | ICD-10-CM | POA: Diagnosis not present

## 2011-11-19 DIAGNOSIS — S0120XA Unspecified open wound of nose, initial encounter: Secondary | ICD-10-CM | POA: Diagnosis not present

## 2011-11-19 LAB — HIV-1 RNA QUANT-NO REFLEX-BLD
HIV 1 RNA Quant: 41 copies/mL — ABNORMAL HIGH (ref <20)
HIV-1 RNA Quant, Log: 1.61 {Log} — ABNORMAL HIGH (ref <1.30)

## 2011-11-19 LAB — T-HELPER CELL (CD4) - (RCID CLINIC ONLY)
CD4 % Helper T Cell: 14 % — ABNORMAL LOW (ref 33–55)
CD4 T Cell Abs: 540 uL (ref 400–2700)

## 2011-11-19 NOTE — Progress Notes (Signed)
  Subjective:    Patient ID: [name], male    DOB: 06-03-1950, 61 y.o.   MRN: [id number]  HPI He comes in for follow up with a concern for low bone density.  He was told recently that he has low bone density as noted in a CT scan.  No dexa scan that he knows of.  He has a history of resistant virus and has been on Combivir, Isentress and ATV/r and has remained undetectable.  He previously was followed at [facility].  He does have known resistance mutations by a genotype at [facility] but apparently others from his previous care in [place] as well.  He comes in because he is worried about bone loss and his HIV meds.     Review of Systems  Musculoskeletal: Positive for myalgias, back pain and arthralgias.  Skin: Negative for rash.       Objective:   Physical Exam  Constitutional: He appears well-developed and well-nourished. No distress.          Assessment & Plan:

## 2011-11-19 NOTE — Assessment & Plan Note (Signed)
He has had gynecomastia since childhood.  Has had surgery in the past but unfortunately is present again.  No good options outside of breast reduction surgery.  Names of plastic surgeons provided.  Patient aware that he will have to pay.

## 2011-11-19 NOTE — Assessment & Plan Note (Signed)
I discussed bone loss in regards to tenofovir which he is not taking.  Current regimen is not known to cause specific problems.  I am hesitant to change anyway.  He does tell me he was on a trial with abacavir in the past when it was new and became resistant to that.   No changes. He will get his labs today and I will call him with any concerns.  He will return in 6 months.

## 2011-11-27 DIAGNOSIS — M47817 Spondylosis without myelopathy or radiculopathy, lumbosacral region: Secondary | ICD-10-CM | POA: Diagnosis not present

## 2011-12-02 DIAGNOSIS — L509 Urticaria, unspecified: Secondary | ICD-10-CM | POA: Diagnosis not present

## 2011-12-22 ENCOUNTER — Telehealth: Payer: Self-pay | Admitting: *Deleted

## 2011-12-22 NOTE — Telephone Encounter (Signed)
Patient called requesting a referral to a plastic surgeon and a local PCP.  Given phone numbers for [facility] and 2 plastic surgeons.  He is wanting to be seen for Gynecomastia, he understands that he would be self pay, as Medicare does not cover this type of surgery. Wendall Mola CMA

## 2011-12-24 ENCOUNTER — Other Ambulatory Visit: Payer: Self-pay | Admitting: *Deleted

## 2011-12-24 ENCOUNTER — Ambulatory Visit (INDEPENDENT_AMBULATORY_CARE_PROVIDER_SITE_OTHER): Payer: Medicare Other | Admitting: Internal Medicine

## 2011-12-24 ENCOUNTER — Encounter: Payer: Self-pay | Admitting: Internal Medicine

## 2011-12-24 VITALS — BP 118/70 | HR 69 | Temp 98.2°F | Ht 69.0 in | Wt 181.6 lb

## 2011-12-24 DIAGNOSIS — M1710 Unilateral primary osteoarthritis, unspecified knee: Secondary | ICD-10-CM | POA: Insufficient documentation

## 2011-12-24 DIAGNOSIS — I82409 Acute embolism and thrombosis of unspecified deep veins of unspecified lower extremity: Secondary | ICD-10-CM

## 2011-12-24 DIAGNOSIS — B20 Human immunodeficiency virus [HIV] disease: Secondary | ICD-10-CM | POA: Diagnosis not present

## 2011-12-24 DIAGNOSIS — M069 Rheumatoid arthritis, unspecified: Secondary | ICD-10-CM

## 2011-12-24 DIAGNOSIS — G459 Transient cerebral ischemic attack, unspecified: Secondary | ICD-10-CM | POA: Insufficient documentation

## 2011-12-24 DIAGNOSIS — R7309 Other abnormal glucose: Secondary | ICD-10-CM

## 2011-12-24 DIAGNOSIS — M549 Dorsalgia, unspecified: Secondary | ICD-10-CM

## 2011-12-24 DIAGNOSIS — M109 Gout, unspecified: Secondary | ICD-10-CM | POA: Insufficient documentation

## 2011-12-24 DIAGNOSIS — I10 Essential (primary) hypertension: Secondary | ICD-10-CM | POA: Insufficient documentation

## 2011-12-24 DIAGNOSIS — M179 Osteoarthritis of knee, unspecified: Secondary | ICD-10-CM | POA: Insufficient documentation

## 2011-12-24 DIAGNOSIS — G8929 Other chronic pain: Secondary | ICD-10-CM

## 2011-12-24 MED ORDER — LAMIVUDINE-ZIDOVUDINE 150-300 MG PO TABS: 1.0000 | ORAL_TABLET | Freq: Two times a day (BID) | ORAL | Status: DC

## 2011-12-24 NOTE — Assessment & Plan Note (Signed)
On metformin for variable insulin metabolism - high and low but off since 08/2011 as "not diabetic" per a1c <5 Would like endo eval on same - referral done

## 2011-12-24 NOTE — Assessment & Plan Note (Signed)
Dx 1993 - no secondary illness related to same Follows with ID - counts undetectable The current medical regimen is effective;  continue present plan and medications.  note peripheral neuropathy hands and feet due to meds - on Lyrica for same

## 2011-12-24 NOTE — Patient Instructions (Addendum)
It was good to see you today. We have reviewed your prior records including labs and tests today we will send to your prior provider(s) for "release of records" as discussed today -  Medications reviewed, no changes at this time. Continue to work with your other specialists as ongoing - will consider transfer of oncology and rheumatology needs to [facility] as needed we'll make referral to endocrine for your sugar metabolism problems . Our office will contact you regarding appointment(s) once made. Please schedule followup in 6 months, call sooner if problems.

## 2011-12-24 NOTE — Assessment & Plan Note (Signed)
Send for ROI from [facility] - dx 2010 and no therapy recommended since dx - follows q55mo (L inguinal LN location er pt)

## 2011-12-24 NOTE — Assessment & Plan Note (Signed)
Dx 2010 - follows with rheum in [place] but considering transfer to [facility] On MTX, no acute flares Complicated by gout and osteoporosis - med mgmt reviewed Send for ROI The current medical regimen is effective;  continue present plan and medications.

## 2011-12-24 NOTE — Progress Notes (Signed)
Subjective:    Patient ID: [name], male    DOB: [birth year], 61 y.o.   MRN: [id number]  HPI New pt to me and our PC division - here to establish with new PCP previous care at [place], [place] and [place] > moving all care to [facility] system  Reviewed chronic medical issues: HIV - dx 1993 - follows with ID for same - counts undetectable - no hx secondary infection or complication related to same - the patient reports compliance with medication(s) as prescribed. Denies adverse side effects.  LLE DVT, recurrent event 2008 and 2009 - on chronic anticoag for same - monitors at home weekly - goal INR 2-3 - the patient reports compliance with medication(s) as prescribed. Denies adverse side effects. No bleeding or bruising  Abnormal glucose -?DM - on metformin until spring 2013 but med stopped as a1c <5 - reports hx hypoglycemia with 3h GTT and "insulin metabolism" problems for many years - would like clarification of this dx with endo - postprandial glc 160, fasting 80  RA and gout - follows with rheum for same, currently in [place], on MTX and colchicine - no recent flares - also OA with prior B TKR and shoulder surgeries  Chronic back problems - severe DDD - failure of prior rod repair and ongoing tx woith [facility] [employer]) - walks with cane assist  CAD - hx MI x 2 2010 tx with stent, then CABG - no recurrent angina symptoms since CABG - the patient reports compliance with medication(s) as prescribed. Denies adverse side effects. Follows with [facility] cards annually   Past Medical History  Diagnosis Date  . Hypertension   . DVT, lower extremity, recurrent 2008, 2009    LLE, chronic anticoag since 2009  . Osteoarthritis, knee     s/p B TKA  . HIV infection dx 1993  . Carotid artery occlusion     40-60% right ICA stenosis (09/2008)  . Hyperlipidemia   . Coronary artery disease 2010    s/p CABG '10  . TIA (transient ischemic attack) 1997    mild residual L mouth droop  . H/O hiatal  hernia 2008    surgery  . Diabetes mellitus     no med since 08/2011  . CLL (chronic lymphoblastic leukemia) dx 2010    Followed at [facility] q44mo, no current therapy   . Gynecomastia, male   . Impotence of organic origin   . Rheumatoid arthritis dx 2010    MTX, follows with rheum  . Gout   . Chronic back pain     follows with Nsurg   Family History  Problem Relation Age of Onset  . Breast cancer Mother   . Prostate cancer Father   . Breast cancer Other   . Hypertension Other   . Hyperlipidemia Other   . Stroke Other   . Diabetes Other    History  Substance Use Topics  . Smoking status: Never Smoker   . Smokeless tobacco: Never Used   Comment: occ wine, single - married and divorced x 2; 3 kids  . Alcohol Use: Yes     occasional wine      Review of Systems Constitutional: Negative for fever or weight change.  Respiratory: Negative for cough and shortness of breath.   Cardiovascular: Negative for chest pain or palpitations.  Gastrointestinal: Negative for abdominal pain, no bowel changes.  Musculoskeletal: Negative for joint swelling.  Skin: Negative for rash.  Neurological: Negative for dizziness or headache.  No  other specific complaints in a complete review of systems (except as listed in HPI above).     Objective:   Physical Exam  BP 118/70  Pulse 69  Temp 98.2 F (36.8 C) (Oral)  Ht 5\' 9"  (1.753 m)  Wt 181 lb 9.6 oz (82.373 kg)  BMI 26.82 kg/m2  SpO2 98% Wt Readings from Last 3 Encounters:  12/24/11 181 lb 9.6 oz (82.373 kg)  11/18/11 182 lb (82.555 kg)  08/07/11 187 lb 8 oz (85.049 kg)   Constitutional:  He appears well-developed and well-nourished. No distress.  Eyes: wears corrective lenses - PERRL, EOMI Neck: Normal range of motion. Neck supple. No JVD or LAD present. No thyromegaly present.  Cardiovascular: Normal rate, regular rhythm and normal heart sounds.  No murmur heard. no BLE edema Pulmonary/Chest: Effort normal and breath sounds normal. No  respiratory distress. no wheezes.  Abdominal: Soft. Bowel sounds are normal. Patient exhibits no distension. There is no tenderness.  Musculoskeletal: spine deformity with prominence of lumbar rod at lower T level and "bony tumor" mid T spine. non tender to palpation - gait is forward bent at hips, aided with cane. B knees s/p TKR, no effusion - B hands with MCP radial deviation. Patient exhibits no other gross deformity.  Neurological: he is alert and oriented to person, place, and time. No cranial nerve deficit. Coordination normal.  Skin: very tan - numerous insect bites on LE>UE. Skin is warm and dry.  No erythema or ulceration.  Psychiatric: he has a normal mood and affect. behavior is normal. Judgment and thought content normal.    Lab Results  Component Value Date   WBC 6.6 11/18/2011   HGB 12.3* 11/18/2011   HCT 35.4* 11/18/2011   PLT 172 11/18/2011   GLUCOSE 76 11/18/2011   CHOL 171 03/20/2011   TRIG 183* 03/20/2011   HDL 40 03/20/2011   LDLCALC 94 03/20/2011   ALT 23 11/18/2011   AST 28 11/18/2011   NA 145 11/18/2011   K 4.0 11/18/2011   CL 109 11/18/2011   CREATININE 0.87 11/18/2011   BUN 19 11/18/2011   CO2 30 11/18/2011   TSH 0.658 *Test methodology is 3rd generation TSH** 09/15/2008   INR 1.91* 07/27/2011   HGBA1C  Value: 4.7 (NOTE) The ADA recommends the following therapeutic goal for glycemic control related to Hgb A1c measurement: Goal of therapy: <6.5 Hgb A1c  Reference: American Diabetes Association: Clinical Practice Recommendations 2010, Diabetes Care, 2010, 33: (Suppl  1). 10/04/2008        Assessment & Plan:   See problem list. Medications and labs reviewed today.  Time spent with pt today 60 minutes, greater than 50% time spent counseling patient on possible diabetes, rheumatc issues with RA/gout and spine, HIV, CAD and hx LLE DVT with home monitoring - also extensive medication review. Also ROI from non system specialists to review of prior records re: RA and CLL

## 2011-12-24 NOTE — Assessment & Plan Note (Signed)
Recurrent events 2008 and 2009 Chronic anticoag since 2009 Home INR monitoring - reviewed warfarin dosing - goal INR 2-3 The current medical regimen is effective;  continue present plan and medications.

## 2011-12-25 DIAGNOSIS — R269 Unspecified abnormalities of gait and mobility: Secondary | ICD-10-CM | POA: Diagnosis not present

## 2011-12-25 DIAGNOSIS — M545 Low back pain, unspecified: Secondary | ICD-10-CM | POA: Diagnosis not present

## 2011-12-25 DIAGNOSIS — M542 Cervicalgia: Secondary | ICD-10-CM | POA: Diagnosis not present

## 2011-12-25 DIAGNOSIS — G609 Hereditary and idiopathic neuropathy, unspecified: Secondary | ICD-10-CM | POA: Diagnosis not present

## 2011-12-31 ENCOUNTER — Telehealth: Payer: Self-pay | Admitting: *Deleted

## 2011-12-31 MED ORDER — WARFARIN SODIUM 5 MG PO TABS: ORAL_TABLET | ORAL | Status: DC

## 2011-12-31 NOTE — Telephone Encounter (Signed)
Left msg on vm INR 1.8. Been taking 10mg  of coumadin....12/31/11@1 :25pm/LMB

## 2011-12-31 NOTE — Telephone Encounter (Signed)
Notified pt with md response & recommendations. Entered labs in epic.. 12/31/11@3 :34pm/LMB

## 2011-12-31 NOTE — Telephone Encounter (Signed)
Pt with persistent subtherapuetic INR on high dose coumadin 10 mg per day noted -   Ok to change to 5 mg tabs - will need change of coumadin to 10 mg daily EXCEPT for 12.5 mg (two and 1/2 of the 5 mg tabs) every Mon-Wed-Fri  For f/u INR 2 wks (will need future lab)

## 2011-12-31 NOTE — Telephone Encounter (Signed)
Notified pt he states he no longer see Dr. Docia Chuck, and when he saw Dr. Felicity Coyer she states she would start managing his coumadin... 12/31/11@2 :04pm/LMB

## 2011-12-31 NOTE — Telephone Encounter (Signed)
Chart indicates the INR has been managed by Dr Docia Chuck (of whom I am not familiar)  Please confirm with pt, and fax results if this is the case

## 2012-01-01 DIAGNOSIS — M47817 Spondylosis without myelopathy or radiculopathy, lumbosacral region: Secondary | ICD-10-CM | POA: Diagnosis not present

## 2012-01-05 ENCOUNTER — Ambulatory Visit (INDEPENDENT_AMBULATORY_CARE_PROVIDER_SITE_OTHER): Payer: Medicare Other | Admitting: Internal Medicine

## 2012-01-05 ENCOUNTER — Encounter: Payer: Self-pay | Admitting: Internal Medicine

## 2012-01-05 VITALS — BP 122/62 | HR 68 | Temp 97.7°F | Ht 69.0 in | Wt 180.2 lb

## 2012-01-05 DIAGNOSIS — G609 Hereditary and idiopathic neuropathy, unspecified: Secondary | ICD-10-CM | POA: Diagnosis not present

## 2012-01-05 DIAGNOSIS — B20 Human immunodeficiency virus [HIV] disease: Secondary | ICD-10-CM

## 2012-01-05 DIAGNOSIS — L8990 Pressure ulcer of unspecified site, unspecified stage: Secondary | ICD-10-CM | POA: Diagnosis not present

## 2012-01-05 DIAGNOSIS — I82409 Acute embolism and thrombosis of unspecified deep veins of unspecified lower extremity: Secondary | ICD-10-CM

## 2012-01-05 DIAGNOSIS — R269 Unspecified abnormalities of gait and mobility: Secondary | ICD-10-CM | POA: Diagnosis not present

## 2012-01-05 DIAGNOSIS — M545 Low back pain, unspecified: Secondary | ICD-10-CM | POA: Diagnosis not present

## 2012-01-05 DIAGNOSIS — L89159 Pressure ulcer of sacral region, unspecified stage: Secondary | ICD-10-CM

## 2012-01-05 DIAGNOSIS — L89109 Pressure ulcer of unspecified part of back, unspecified stage: Secondary | ICD-10-CM

## 2012-01-05 DIAGNOSIS — M542 Cervicalgia: Secondary | ICD-10-CM | POA: Diagnosis not present

## 2012-01-05 NOTE — Assessment & Plan Note (Signed)
Recurrent events 2008 and 2009 Chronic anticoag since 2009 Home INR monitoring - reviewed warfarin dosing - goal INR 2-3 The current medical regimen is effective;  continue present plan and medications.  Lab Results  Component Value Date   INR 1.91* 07/27/2011   INR 1.83* 07/26/2011   INR 1.57* 07/25/2011

## 2012-01-05 NOTE — Progress Notes (Signed)
Subjective:    Patient ID: [name], male    DOB: 03-06-1951, 61 y.o.   MRN: [id number]  HPI  complains of recurrent buttock pressure wound at tailbone Onset 2 weeks ago, progressively worse Hx same - last episode >2 years Has done home care such as positioning change and barrier cream Requests refer to [facility] wound center  Past Medical History  Diagnosis Date  . Hypertension   . DVT, lower extremity, recurrent 2008, 2009    LLE, chronic anticoag since 2009  . Osteoarthritis, knee     s/p B TKA  . HIV infection dx 1993  . Carotid artery occlusion     40-60% right ICA stenosis (09/2008)  . Hyperlipidemia   . Coronary artery disease 2010    s/p CABG '10  . TIA (transient ischemic attack) 1997    mild residual L mouth droop  . H/O hiatal hernia 2008    surgery  . Diabetes mellitus     no med since 08/2011  . CLL (chronic lymphoblastic leukemia) dx 2010    Followed at [facility] q35mo, no current therapy   . Gynecomastia, male   . Impotence of organic origin   . Rheumatoid arthritis dx 2010    MTX, follows with rheum  . Gout   . Chronic back pain     follows with Nsurg    Review of Systems  Constitutional: Negative for fever and unexpected weight change.  Skin: Positive for wound. Negative for rash.       Objective:   Physical Exam BP 122/62  Pulse 68  Temp 97.7 F (36.5 C) (Oral)  Ht 5\' 9"  (1.753 m)  Wt 180 lb 3.2 oz (81.738 kg)  BMI 26.61 kg/m2  SpO2 94% Wt Readings from Last 3 Encounters:  01/05/12 180 lb 3.2 oz (81.738 kg)  12/24/11 181 lb 9.6 oz (82.373 kg)  11/18/11 182 lb (82.555 kg)   Constitutional:  He appears well-developed and well-nourished. No distress. CV: RRR, no edema Lung: CTA B Neurological: he is alert and oriented to person, place, and time. No cranial nerve deficit. Coordination normal.  Skin: coccyx pressure ulcer x2: 1st Stage 1 - 6mm x 3mm, stage 2 6mm x 4mm to right side of inner gluteal fold.  No erythema, drainage, odor or  cellulitis.  Psychiatric: he has a normal mood and affect. behavior is normal. Judgment and thought content normal.   Lab Results  Component Value Date   WBC 6.6 11/18/2011   HGB 12.3* 11/18/2011   HCT 35.4* 11/18/2011   PLT 172 11/18/2011   GLUCOSE 76 11/18/2011   CHOL 171 03/20/2011   TRIG 183* 03/20/2011   HDL 40 03/20/2011   LDLCALC 94 03/20/2011   ALT 23 11/18/2011   AST 28 11/18/2011   NA 145 11/18/2011   K 4.0 11/18/2011   CL 109 11/18/2011   CREATININE 0.87 11/18/2011   BUN 19 11/18/2011   CO2 30 11/18/2011   TSH 0.658 *Test methodology is 3rd generation TSH* 09/15/2008   INR 1.91* 07/27/2011   HGBA1C  Value: 4.7 (NOTE) The ADA recommends the following therapeutic goal for glycemic control related to Hgb A1c measurement: Goal of therapy: <6.5 Hgb A1c  Reference: American Diabetes Association: Clinical Practice Recommendations 2010, Diabetes Care, 2010, 33: (Suppl  1). 10/04/2008        Assessment & Plan:   coccxy pressure wound, hx same - stage 1-2 at this time Refer to wound care center in [place] as requested Continue  ongoing wound care at home until then

## 2012-01-05 NOTE — Assessment & Plan Note (Signed)
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  note peripheral neuropathy hands and feet due to meds - on Lyrica for same

## 2012-01-05 NOTE — Patient Instructions (Signed)
It was good to see you today. we'll make referral to wound care center in [facility] for recurrent pressure ulcer. Our office will contact you regarding appointment(s) once made.  Pressure Ulcer A pressure ulcer is a sore where the skin breaks down and exposes deeper layers of tissue. It develops in areas of the body where there is unrelieved pressure. Pressure ulcers are usually found over a boney area, such as the shoulder blades, spine, lower back, hips, knees, ankles, and heels. CAUSES    Decreased ability to move.   Decreased ability to feel pain or discomfort.   Moisture from urine or stool.   Poor nutrition.   Pulling sheets that are under a patient when changing his or her position.  STAGING PRESSURE ULCERS Your caregiver may determine the degree of severity (stage) of your pressure ulcer. The stages include:  Stage 1: The skin is red, and when the skin is pressed, it stays red.   Stage 2: The top layer of skin is gone, and there is a shallow, pink ulcer.   Stage 3: The ulcer becomes deeper, and it is more difficult to see the whole wound. Also, there may be yellow or brown parts, as well as pink and red parts.   Stage 4: The ulcer may be deep and red, pink, brown, white, or yellow. Bone or muscle may be seen.   Unstageable pressure ulcer: The ulcer is covered almost completely with black, brown, or yellow tissue. It is not known how deep the ulcer is or what stage it is until this covering comes off.   Suspected deep tissue injury: A patient's skin can be injured from pressure or pulling on the skin when his or her position is changed. The skin appears purple or maroon. There may not be an opening in the skin, but there could be a blood-filled blister. This deep tissue injury is often difficult to see in people with darker skin tones. The skin may go back to normal when pressure is relieved, or the site may open up and become deeper in time.  DIAGNOSIS   Your caregiver will diagnose  your pressure ulcer based on its appearance. Your caregiver may determine the stage of your pressure ulcer as well. Your caregiver may request tests to check for infection, assess your circulation, or to check for other diseases, such as diabetes. TREATMENT   Treatment of your pressure ulcer begins with determining what stage the ulcer is in. Your treatment team may include your caregiver, a wound care specialist, a nutritionist, a physical therapist, and a Careers adviser. Treatments include:    Moving or repositioning every 1 to 2 hours.   Using beds or mattresses to shift your body weight and pressure points frequently.   Improving your diet.   Cleaning and bandaging (dressing) the open wound.   Giving antibiotic medicines.   Removing damaged tissue.   Surgery and sometimes skin grafts.  HOME CARE INSTRUCTIONS  Follow the care plan that was started in the hospital.   Avoid staying in the same position for more than 2 hours. Use padding, devices, or mattresses to cushion your pressure points as directed by your caregiver.   Eat well. Take nutritional supplements and vitamins as directed by your caregiver.   Keep all follow-up appointments.   Take pain medicine as directed by your caregiver.  SEEK MEDICAL CARE IF:    Your pressure ulcer is not improving.   You do not know how to care for your pressure ulcer.  You notice other areas of redness on your skin.  SEEK IMMEDIATE MEDICAL CARE IF:    You have increasing redness, swelling, or pain in your pressure ulcer.   You notice pus, or increased pus, coming from your pressure ulcer.   You have a fever.   You notice a bad smell coming from the wound or dressing.   Your pressure ulcer opens up again.  MAKE SURE YOU:    Understand these instructions.   Will watch your condition.   Will get help right away if you are not doing well or get worse.  Document Released: 04/28/2005 Document Revised: 04/17/2011 Document Reviewed:  12/13/2010 [facility] Patient Information 2012 [place], [place].

## 2012-01-08 DIAGNOSIS — M545 Low back pain, unspecified: Secondary | ICD-10-CM | POA: Diagnosis not present

## 2012-01-08 DIAGNOSIS — M542 Cervicalgia: Secondary | ICD-10-CM | POA: Diagnosis not present

## 2012-01-08 DIAGNOSIS — Z79899 Other long term (current) drug therapy: Secondary | ICD-10-CM | POA: Diagnosis not present

## 2012-01-08 DIAGNOSIS — R269 Unspecified abnormalities of gait and mobility: Secondary | ICD-10-CM | POA: Diagnosis not present

## 2012-01-08 DIAGNOSIS — G609 Hereditary and idiopathic neuropathy, unspecified: Secondary | ICD-10-CM | POA: Diagnosis not present

## 2012-01-08 DIAGNOSIS — Z21 Asymptomatic human immunodeficiency virus [HIV] infection status: Secondary | ICD-10-CM | POA: Diagnosis not present

## 2012-01-08 DIAGNOSIS — B369 Superficial mycosis, unspecified: Secondary | ICD-10-CM | POA: Diagnosis not present

## 2012-01-08 DIAGNOSIS — IMO0002 Reserved for concepts with insufficient information to code with codable children: Secondary | ICD-10-CM | POA: Diagnosis not present

## 2012-01-13 DIAGNOSIS — Z951 Presence of aortocoronary bypass graft: Secondary | ICD-10-CM | POA: Diagnosis not present

## 2012-01-13 DIAGNOSIS — Z7982 Long term (current) use of aspirin: Secondary | ICD-10-CM | POA: Diagnosis not present

## 2012-01-13 DIAGNOSIS — Z79899 Other long term (current) drug therapy: Secondary | ICD-10-CM | POA: Diagnosis not present

## 2012-01-13 DIAGNOSIS — M542 Cervicalgia: Secondary | ICD-10-CM | POA: Diagnosis not present

## 2012-01-13 DIAGNOSIS — G609 Hereditary and idiopathic neuropathy, unspecified: Secondary | ICD-10-CM | POA: Diagnosis not present

## 2012-01-13 DIAGNOSIS — R269 Unspecified abnormalities of gait and mobility: Secondary | ICD-10-CM | POA: Diagnosis not present

## 2012-01-13 DIAGNOSIS — Z7901 Long term (current) use of anticoagulants: Secondary | ICD-10-CM | POA: Diagnosis not present

## 2012-01-13 DIAGNOSIS — M545 Low back pain, unspecified: Secondary | ICD-10-CM | POA: Diagnosis not present

## 2012-01-13 DIAGNOSIS — Z21 Asymptomatic human immunodeficiency virus [HIV] infection status: Secondary | ICD-10-CM | POA: Diagnosis not present

## 2012-01-13 DIAGNOSIS — R51 Headache: Secondary | ICD-10-CM | POA: Diagnosis not present

## 2012-01-13 DIAGNOSIS — S0100XA Unspecified open wound of scalp, initial encounter: Secondary | ICD-10-CM | POA: Diagnosis not present

## 2012-01-14 DIAGNOSIS — Z7901 Long term (current) use of anticoagulants: Secondary | ICD-10-CM | POA: Diagnosis not present

## 2012-01-14 DIAGNOSIS — I749 Embolism and thrombosis of unspecified artery: Secondary | ICD-10-CM | POA: Diagnosis not present

## 2012-01-15 DIAGNOSIS — L8990 Pressure ulcer of unspecified site, unspecified stage: Secondary | ICD-10-CM | POA: Diagnosis not present

## 2012-01-15 DIAGNOSIS — IMO0002 Reserved for concepts with insufficient information to code with codable children: Secondary | ICD-10-CM | POA: Diagnosis not present

## 2012-01-15 DIAGNOSIS — M2550 Pain in unspecified joint: Secondary | ICD-10-CM | POA: Diagnosis not present

## 2012-01-15 DIAGNOSIS — Z79899 Other long term (current) drug therapy: Secondary | ICD-10-CM | POA: Diagnosis not present

## 2012-01-15 DIAGNOSIS — M069 Rheumatoid arthritis, unspecified: Secondary | ICD-10-CM | POA: Diagnosis not present

## 2012-01-15 DIAGNOSIS — L8992 Pressure ulcer of unspecified site, stage 2: Secondary | ICD-10-CM | POA: Diagnosis not present

## 2012-01-15 DIAGNOSIS — L89109 Pressure ulcer of unspecified part of back, unspecified stage: Secondary | ICD-10-CM | POA: Diagnosis not present

## 2012-01-15 DIAGNOSIS — M76899 Other specified enthesopathies of unspecified lower limb, excluding foot: Secondary | ICD-10-CM | POA: Diagnosis not present

## 2012-01-16 DIAGNOSIS — M545 Low back pain, unspecified: Secondary | ICD-10-CM | POA: Diagnosis not present

## 2012-01-16 DIAGNOSIS — R269 Unspecified abnormalities of gait and mobility: Secondary | ICD-10-CM | POA: Diagnosis not present

## 2012-01-16 DIAGNOSIS — M542 Cervicalgia: Secondary | ICD-10-CM | POA: Diagnosis not present

## 2012-01-16 DIAGNOSIS — G609 Hereditary and idiopathic neuropathy, unspecified: Secondary | ICD-10-CM | POA: Diagnosis not present

## 2012-01-19 DIAGNOSIS — R269 Unspecified abnormalities of gait and mobility: Secondary | ICD-10-CM | POA: Diagnosis not present

## 2012-01-19 DIAGNOSIS — M545 Low back pain, unspecified: Secondary | ICD-10-CM | POA: Diagnosis not present

## 2012-01-19 DIAGNOSIS — M542 Cervicalgia: Secondary | ICD-10-CM | POA: Diagnosis not present

## 2012-01-19 DIAGNOSIS — G609 Hereditary and idiopathic neuropathy, unspecified: Secondary | ICD-10-CM | POA: Diagnosis not present

## 2012-01-20 ENCOUNTER — Ambulatory Visit (INDEPENDENT_AMBULATORY_CARE_PROVIDER_SITE_OTHER): Payer: Medicare Other | Admitting: Endocrinology

## 2012-01-20 ENCOUNTER — Other Ambulatory Visit: Payer: Medicare Other

## 2012-01-20 ENCOUNTER — Encounter: Payer: Self-pay | Admitting: Endocrinology

## 2012-01-20 ENCOUNTER — Other Ambulatory Visit: Payer: Self-pay | Admitting: *Deleted

## 2012-01-20 ENCOUNTER — Other Ambulatory Visit (INDEPENDENT_AMBULATORY_CARE_PROVIDER_SITE_OTHER): Payer: Medicare Other

## 2012-01-20 VITALS — BP 118/68 | HR 67 | Temp 97.3°F | Wt 180.0 lb

## 2012-01-20 DIAGNOSIS — G609 Hereditary and idiopathic neuropathy, unspecified: Secondary | ICD-10-CM | POA: Diagnosis not present

## 2012-01-20 DIAGNOSIS — E162 Hypoglycemia, unspecified: Secondary | ICD-10-CM

## 2012-01-20 DIAGNOSIS — M545 Low back pain, unspecified: Secondary | ICD-10-CM | POA: Diagnosis not present

## 2012-01-20 DIAGNOSIS — M542 Cervicalgia: Secondary | ICD-10-CM | POA: Diagnosis not present

## 2012-01-20 DIAGNOSIS — R269 Unspecified abnormalities of gait and mobility: Secondary | ICD-10-CM | POA: Diagnosis not present

## 2012-01-20 LAB — CORTISOL
Cortisol, Plasma: 5.6 ug/dL
Cortisol, Plasma: 17.6 ug/dL

## 2012-01-20 MED ORDER — COSYNTROPIN 0.25 MG IJ SOLR
0.2500 mg | Freq: Once | INTRAMUSCULAR | Status: AC
  Administered 2012-01-20: 0.25 mg via INTRAMUSCULAR

## 2012-01-20 NOTE — Progress Notes (Signed)
Subjective:    Patient ID: [name], male    DOB: 10/25/1950, 61 y.o.   MRN: [id number]  HPI Pt states few years of severely labile glucose (40-170).  He was rx'ed with metformin, but he stopped due to hypoglycemia, and a1c being <5.  He says he eats 3 meals per day, and an afternoon snack.  The hypoglycemia happens a few hrs after eating.  He occasionally has hypoglycemia middle of the night, but it usually happens during the day.  sxs are relieved by eating.   He has a few years of severe burning of the feet, and assoc numbness.   Past Medical History  Diagnosis Date  . Hypertension   . DVT, lower extremity, recurrent 2008, 2009    LLE, chronic anticoag since 2009  . Osteoarthritis, knee     s/p B TKA  . HIV infection dx 1993  . Carotid artery occlusion     40-60% right ICA stenosis (09/2008)  . Hyperlipidemia   . Coronary artery disease 2010    s/p CABG '10  . TIA (transient ischemic attack) 1997    mild residual L mouth droop  . H/O hiatal hernia 2008    surgery  . Diabetes mellitus     no med since 08/2011  . CLL (chronic lymphoblastic leukemia) dx 2010    Followed at [facility] q17mo, no current therapy   . Gynecomastia, male   . Impotence of organic origin   . Rheumatoid arthritis dx 2010    MTX, follows with rheum  . Gout   . Chronic back pain     follows with Nsurg    Past Surgical History  Procedure Date  . Spine surgery 2010    "rod and screws", "failed"  . Cholecystectomy   . Coronary artery bypass graft 2010  . Hiatal hernia repair   . Shoulder surgery     rt lft repairs  . Mandible surgery     tmj  . Varicose vein     stripping  . Knee arthroplasty     left  . Tonsillectomy   . Knee arthroplasty 07/22/2011    Procedure: COMPUTER ASSISTED TOTAL KNEE ARTHROPLASTY;  Surgeon: Cammy Copa, MD;  Location: [facility] OR;  Service: Orthopedics;  Laterality: Right;  Right total knee arthroplasty    History   Social History  . Marital Status: Widowed   Spouse Name: N/A    Number of Children: N/A  . Years of Education: N/A   Occupational History  . Not on file.   Social History Main Topics  . Smoking status: Never Smoker   . Smokeless tobacco: Never Used   Comment: occ wine, single - married and divorced x 2; 3 kids  . Alcohol Use: Yes     occasional wine  . Drug Use: No  . Sexually Active: No     pt. given condoms   Other Topics Concern  . Not on file   Social History Narrative  . No narrative on file    Current Outpatient Prescriptions on File Prior to Visit  Medication Sig Dispense Refill  . aspirin 81 MG chewable tablet Chew 81 mg by mouth daily.        [name] atazanavir (REYATAZ) 300 MG capsule Take 300 mg by mouth daily with breakfast.        . atorvastatin (LIPITOR) 10 MG tablet Take 1 tablet (10 mg total) by mouth daily.  30 tablet  12  . calcium carbonate (OS-CAL) 600  MG TABS Take 1,200 mg by mouth daily.       . cholecalciferol (VITAMIN D) 1000 UNITS tablet Take 1,000 Units by mouth daily.        . colchicine 0.6 MG tablet Take 0.6 mg by mouth 2 (two) times daily.      . folic acid (FOLVITE) 1 MG tablet Take 1 mg by mouth daily.        [name] lamiVUDine-zidovudine (COMBIVIR) 150-300 MG per tablet Take 1 tablet by mouth 2 (two) times daily.  60 tablet  6  . methotrexate (RHEUMATREX) 2.5 MG tablet Take 2.5 mg by mouth once a week. Caution:Chemotherapy. Protect from light. Take 5 tablets once a week on Wednesdays      . metoprolol succinate (TOPROL-XL) 25 MG 24 hr tablet Take 1 tablet (25 mg total) by mouth daily.  30 tablet  12  . pregabalin (LYRICA) 200 MG capsule Take 1 capsule (200 mg total) by mouth 2 (two) times daily.  60 capsule  4  . raltegravir (ISENTRESS) 400 MG tablet Take 400 mg by mouth 2 (two) times daily.        . ritonavir (NORVIR) 100 MG capsule Take 100 mg by mouth 1 day or 1 dose.        . tadalafil (CIALIS) 20 MG tablet Take 10 mg by mouth daily as needed. Take one half tablet as needed      . warfarin  (COUMADIN) 5 MG tablet 2 tabs per day except for 2.5 tabs on Mon-Wed-Fri  100 tablet  5    Allergies  Allergen Reactions  . Morphine     REACTION: severe headache  . Other Hives    Pecan  . Oxycodone-Acetaminophen     REACTION: headache  . Penicillins     REACTION: red, flushed  . Promethazine Hcl     REACTION: makes him feel drunk    Family History  Problem Relation Age of Onset  . Breast cancer Mother   . Prostate cancer Father   . Breast cancer Other   . Hypertension Other   . Hyperlipidemia Other   . Stroke Other   . Diabetes Other    BP 118/68  Pulse 67  Temp 97.3 F (36.3 C) (Oral)  Wt 180 lb (81.647 kg)  SpO2 93%  Review of Systems denies weight change, fever, diarrhea, rash, visual loss, abdominal pain, sob, depression, urinary frequency, galactorrhea, excessive diaphoresis, and seizure. Has has had n/v with hypoglycemia only twice.  He has headaches, rhinorrhea, easy bruising, muscle cramps, and arthralgias.    Objective:   Physical Exam VS: see vs page GEN: no distress HEAD: head: no deformity eyes: no periorbital swelling, no proptosis external nose and ears are normal mouth: no lesion seen NECK: supple, thyroid is not enlarged CHEST WALL: no deformity LUNGS: clear to auscultation BREASTS:  bilat gynecomastia (chronic) CV: reg rate and rhythm, no murmur ABD: abdomen is soft, nontender.  no hepatosplenomegaly.  not distended.  no hernia MUSCULOSKELETAL: muscle bulk and strength are grossly normal.  no obvious joint swelling.  gait is normal and steady.  Old healed surgical scar at the lumbar spine.  Spinal deformity is noted EXTEMITIES: no deformity.  no ulcer on the feet.  feet are of normal color and temp.  no edema PULSES: dorsalis pedis intact bilat.   NEURO:  cn 2-12 grossly intact.   readily moves all 4's.  sensation is intact to touch on the legs, burt decreased from normal. SKIN:  Normal  texture and temperature.  No rash or suspicious lesion is  visible.  hyperpigmentation is noted (pt says due to moving a large lawn).   NODES:  None palpable at the neck PSYCH: alert, oriented x3.  Does not appear anxious nor depressed.  (acth stim test is normal)    Assessment & Plan:  Labile glucose, with episodes of mild postprandial hypoglycemia.  He has a high risk of developing DM with time. Neuropathy, not due to abnormal glucose Headache, possibly due to fluctuating glucose. HIV, prob not related to glucose fluctuations

## 2012-01-20 NOTE — Patient Instructions (Addendum)
blood tests are being requested for you today.  You will receive a letter with results. With time, you will probably develop diabetes. In the meantime, your best treatment is small frequent meals. good diet and exercise habits significanly improve the control of your blood sugar.  please let me know if you wish to be referred to a dietician.  high blood sugar is very risky to your health.  you should see an eye doctor every year.

## 2012-01-21 ENCOUNTER — Encounter: Payer: Self-pay | Admitting: Endocrinology

## 2012-01-21 DIAGNOSIS — M545 Low back pain, unspecified: Secondary | ICD-10-CM | POA: Diagnosis not present

## 2012-01-21 DIAGNOSIS — M542 Cervicalgia: Secondary | ICD-10-CM | POA: Diagnosis not present

## 2012-01-21 DIAGNOSIS — G609 Hereditary and idiopathic neuropathy, unspecified: Secondary | ICD-10-CM | POA: Diagnosis not present

## 2012-01-21 DIAGNOSIS — R269 Unspecified abnormalities of gait and mobility: Secondary | ICD-10-CM | POA: Diagnosis not present

## 2012-01-25 DIAGNOSIS — Z21 Asymptomatic human immunodeficiency virus [HIV] infection status: Secondary | ICD-10-CM | POA: Diagnosis not present

## 2012-01-25 DIAGNOSIS — M25559 Pain in unspecified hip: Secondary | ICD-10-CM | POA: Diagnosis not present

## 2012-01-25 DIAGNOSIS — Z951 Presence of aortocoronary bypass graft: Secondary | ICD-10-CM | POA: Diagnosis not present

## 2012-01-25 DIAGNOSIS — M1610 Unilateral primary osteoarthritis, unspecified hip: Secondary | ICD-10-CM | POA: Diagnosis not present

## 2012-01-25 DIAGNOSIS — Z7982 Long term (current) use of aspirin: Secondary | ICD-10-CM | POA: Diagnosis not present

## 2012-01-25 DIAGNOSIS — M169 Osteoarthritis of hip, unspecified: Secondary | ICD-10-CM | POA: Diagnosis not present

## 2012-01-25 DIAGNOSIS — M549 Dorsalgia, unspecified: Secondary | ICD-10-CM | POA: Diagnosis not present

## 2012-01-25 DIAGNOSIS — Z7901 Long term (current) use of anticoagulants: Secondary | ICD-10-CM | POA: Diagnosis not present

## 2012-01-25 DIAGNOSIS — G8929 Other chronic pain: Secondary | ICD-10-CM | POA: Diagnosis not present

## 2012-01-25 DIAGNOSIS — Z4802 Encounter for removal of sutures: Secondary | ICD-10-CM | POA: Diagnosis not present

## 2012-01-25 DIAGNOSIS — Z79899 Other long term (current) drug therapy: Secondary | ICD-10-CM | POA: Diagnosis not present

## 2012-01-27 DIAGNOSIS — M47817 Spondylosis without myelopathy or radiculopathy, lumbosacral region: Secondary | ICD-10-CM | POA: Diagnosis not present

## 2012-01-27 DIAGNOSIS — M545 Low back pain, unspecified: Secondary | ICD-10-CM | POA: Diagnosis not present

## 2012-01-27 DIAGNOSIS — M542 Cervicalgia: Secondary | ICD-10-CM | POA: Diagnosis not present

## 2012-01-27 DIAGNOSIS — R269 Unspecified abnormalities of gait and mobility: Secondary | ICD-10-CM | POA: Diagnosis not present

## 2012-01-27 DIAGNOSIS — L89109 Pressure ulcer of unspecified part of back, unspecified stage: Secondary | ICD-10-CM | POA: Diagnosis not present

## 2012-01-27 DIAGNOSIS — G609 Hereditary and idiopathic neuropathy, unspecified: Secondary | ICD-10-CM | POA: Diagnosis not present

## 2012-01-27 DIAGNOSIS — L8990 Pressure ulcer of unspecified site, unspecified stage: Secondary | ICD-10-CM | POA: Diagnosis not present

## 2012-01-27 DIAGNOSIS — IMO0002 Reserved for concepts with insufficient information to code with codable children: Secondary | ICD-10-CM | POA: Diagnosis not present

## 2012-01-27 DIAGNOSIS — L8992 Pressure ulcer of unspecified site, stage 2: Secondary | ICD-10-CM | POA: Diagnosis not present

## 2012-01-29 DIAGNOSIS — M545 Low back pain, unspecified: Secondary | ICD-10-CM | POA: Diagnosis not present

## 2012-01-29 DIAGNOSIS — M1710 Unilateral primary osteoarthritis, unspecified knee: Secondary | ICD-10-CM | POA: Diagnosis not present

## 2012-01-29 DIAGNOSIS — M25559 Pain in unspecified hip: Secondary | ICD-10-CM | POA: Diagnosis not present

## 2012-01-30 ENCOUNTER — Other Ambulatory Visit: Payer: Self-pay | Admitting: *Deleted

## 2012-01-30 ENCOUNTER — Telehealth: Payer: Self-pay | Admitting: General Practice

## 2012-01-30 DIAGNOSIS — M545 Low back pain, unspecified: Secondary | ICD-10-CM | POA: Diagnosis not present

## 2012-01-30 DIAGNOSIS — G609 Hereditary and idiopathic neuropathy, unspecified: Secondary | ICD-10-CM | POA: Diagnosis not present

## 2012-01-30 DIAGNOSIS — M542 Cervicalgia: Secondary | ICD-10-CM | POA: Diagnosis not present

## 2012-01-30 DIAGNOSIS — R269 Unspecified abnormalities of gait and mobility: Secondary | ICD-10-CM | POA: Diagnosis not present

## 2012-01-30 DIAGNOSIS — B20 Human immunodeficiency virus [HIV] disease: Secondary | ICD-10-CM

## 2012-01-30 LAB — POCT INR: INR: 2.3

## 2012-01-30 MED ORDER — PREGABALIN 200 MG PO CAPS: 200.0000 mg | ORAL_CAPSULE | Freq: Two times a day (BID) | ORAL | Status: DC

## 2012-01-30 NOTE — Telephone Encounter (Signed)
Received fax from [facility] for approval for Pt to stop Warfarin/Coumadin for 5 days prior to surgery. Paperwork placed in Basket for review/signature.

## 2012-02-02 ENCOUNTER — Telehealth: Payer: Self-pay | Admitting: General Practice

## 2012-02-02 NOTE — Telephone Encounter (Signed)
Attempted to contact patient about upcoming spine procedure.  Unable to leave message.

## 2012-02-02 NOTE — Telephone Encounter (Signed)
Ok to hold warfarin but would also bridge for DVT hx - will forward this note and paperwork to [place], our coumadin clinic RN to coordinate same - thanks

## 2012-02-02 NOTE — Telephone Encounter (Signed)
Busy signal

## 2012-02-03 ENCOUNTER — Telehealth: Payer: Self-pay | Admitting: General Practice

## 2012-02-03 ENCOUNTER — Ambulatory Visit (INDEPENDENT_AMBULATORY_CARE_PROVIDER_SITE_OTHER): Payer: Medicare Other | Admitting: General Practice

## 2012-02-03 DIAGNOSIS — I82409 Acute embolism and thrombosis of unspecified deep veins of unspecified lower extremity: Secondary | ICD-10-CM | POA: Diagnosis not present

## 2012-02-03 DIAGNOSIS — Z7901 Long term (current) use of anticoagulants: Secondary | ICD-10-CM | POA: Insufficient documentation

## 2012-02-03 LAB — POCT INR: INR: 2.3

## 2012-02-03 MED ORDER — ENOXAPARIN SODIUM 80 MG/0.8ML ~~LOC~~ SOLN: 80.0000 mg | Freq: Two times a day (BID) | SUBCUTANEOUS | Status: DC

## 2012-02-03 NOTE — Telephone Encounter (Signed)
Opened in error

## 2012-02-03 NOTE — Telephone Encounter (Signed)
Paperwork forwarded to [place] on 02/02/12.

## 2012-02-03 NOTE — Telephone Encounter (Addendum)
Instructions given to patient via telephone call.  Pt to have spine injection on Tuesday 10/1. Last INR reported by patient 2.3 on 9/20. Wt. 180 lb, cc 101.39 and [place] 0.87   See instructions below per Dr. Felicity Coyer.  Patient to have INR checked in coumadin clinic Tuesday 10-1 before procedure. 9-26 - Last dose of coumadin 9-27 - Do not take coumadin or Lovenox 9-28 - Lovenox in AM and PM - No coumadin 9-29 - Lovenox in AM and PM - No coumadin 9-30 - Lovenox in AM only - No coumadin 10-1 - Procedure 10-2 - Lovenox in AM and PM and take 3 (15mg ) tablets of coumadin 10-3 - Lovenox in AM and PM and take 3 (15mg ) tablets of coumadin 10-4  Lovenox in AM and PM and take 2 1/2 (12.5mg ) tablets of coumadin 10-5  Lovenox in AM and PM and take 2 1/2 (12.5 mg) tablets of coumadin 10-6  Lovenox in AM and PM and take 2 (10 mg) tablets of coumadin Re-check on 10-7 (Do not take anything until instructed by coumadin clinic)

## 2012-02-03 NOTE — Patient Instructions (Signed)
Patient to have spine injection on Monday 9-30 or Tuesday 10/1. Last INR reported by patient 2.3 on 9/20.  Wt. 180 lb, [place] 0.87, CC - 101.39.   Instructions given to patient regarding holding coumadin and Lovenox dosing via telephone call per patient.  See instructions below per Dr. Felicity Coyer.  Patient to have INR checked in coumadin clinic Monday 9-30 before procedure.  Called in Lovenox 80 mg SQ Q12 hours. 9-26 - Last dose of coumadin 9-27 - Do not take coumadin or Lovenox 9-28 - Lovenox in AM and PM - No coumadin 9-29 - Lovenox in AM and PM - No coumadin 9-30 - Lovenox in AM only - No coumadin 10-1 - Procedure 10-2 - Lovenox in AM and PM and take 3 (15mg ) tablets of coumadin 10-3 - Lovenox in AM and PM and take 3 (15mg ) tablets of coumadin 10-4  Lovenox in AM and PM and take 2 1/2 (12.5mg ) tablets of coumadin 10-5  Lovenox in AM and PM and take 2 1/2 (12.5 mg) tablets of coumadin 10-6  Lovenox in AM and PM and take 2 (10 mg) tablets of coumadin Re-check on 10-7 (Do not take anything until instructed by coumadin clinic)

## 2012-02-06 DIAGNOSIS — M542 Cervicalgia: Secondary | ICD-10-CM | POA: Diagnosis not present

## 2012-02-06 DIAGNOSIS — G609 Hereditary and idiopathic neuropathy, unspecified: Secondary | ICD-10-CM | POA: Diagnosis not present

## 2012-02-06 DIAGNOSIS — M545 Low back pain, unspecified: Secondary | ICD-10-CM | POA: Diagnosis not present

## 2012-02-06 DIAGNOSIS — R269 Unspecified abnormalities of gait and mobility: Secondary | ICD-10-CM | POA: Diagnosis not present

## 2012-02-08 DIAGNOSIS — Z23 Encounter for immunization: Secondary | ICD-10-CM | POA: Diagnosis not present

## 2012-02-09 DIAGNOSIS — M542 Cervicalgia: Secondary | ICD-10-CM | POA: Diagnosis not present

## 2012-02-09 DIAGNOSIS — R269 Unspecified abnormalities of gait and mobility: Secondary | ICD-10-CM | POA: Diagnosis not present

## 2012-02-09 DIAGNOSIS — G609 Hereditary and idiopathic neuropathy, unspecified: Secondary | ICD-10-CM | POA: Diagnosis not present

## 2012-02-09 DIAGNOSIS — M545 Low back pain, unspecified: Secondary | ICD-10-CM | POA: Diagnosis not present

## 2012-02-10 ENCOUNTER — Ambulatory Visit (INDEPENDENT_AMBULATORY_CARE_PROVIDER_SITE_OTHER): Payer: Medicare Other | Admitting: General Practice

## 2012-02-10 DIAGNOSIS — I82409 Acute embolism and thrombosis of unspecified deep veins of unspecified lower extremity: Secondary | ICD-10-CM

## 2012-02-10 DIAGNOSIS — Z7901 Long term (current) use of anticoagulants: Secondary | ICD-10-CM | POA: Diagnosis not present

## 2012-02-10 LAB — POCT INR: INR: 1.2

## 2012-02-10 NOTE — Patient Instructions (Addendum)
Patient took am dose of Lovenox this am so procedure re-scheduled for 10-2 @ 9am. 10-1 - Lovenox in AM only - No coumadin  10-2 - Procedure (Do not take any Lovenox or coumadin) 10-3 - Lovenox in AM and PM and take 3 (15mg ) tablets of coumadin  10-4- Lovenox in AM and PM and take 3 (15mg ) tablets of coumadin  10-5Lovenox in AM and PM and take 2 1/2 (12.5mg ) tablets of coumadin  10-6 Lovenox in AM and PM and take 2 1/2 (12.5 mg) tablets of coumadin  Re-check on 10-7 (Do not take anything until instructed by coumadin clinic)

## 2012-02-11 DIAGNOSIS — G894 Chronic pain syndrome: Secondary | ICD-10-CM | POA: Diagnosis not present

## 2012-02-11 DIAGNOSIS — IMO0002 Reserved for concepts with insufficient information to code with codable children: Secondary | ICD-10-CM | POA: Diagnosis not present

## 2012-02-11 DIAGNOSIS — M961 Postlaminectomy syndrome, not elsewhere classified: Secondary | ICD-10-CM | POA: Diagnosis not present

## 2012-02-13 DIAGNOSIS — M545 Low back pain, unspecified: Secondary | ICD-10-CM | POA: Diagnosis not present

## 2012-02-13 DIAGNOSIS — M542 Cervicalgia: Secondary | ICD-10-CM | POA: Diagnosis not present

## 2012-02-13 DIAGNOSIS — R269 Unspecified abnormalities of gait and mobility: Secondary | ICD-10-CM | POA: Diagnosis not present

## 2012-02-13 DIAGNOSIS — G609 Hereditary and idiopathic neuropathy, unspecified: Secondary | ICD-10-CM | POA: Diagnosis not present

## 2012-02-16 ENCOUNTER — Ambulatory Visit (INDEPENDENT_AMBULATORY_CARE_PROVIDER_SITE_OTHER): Payer: Medicare Other | Admitting: General Practice

## 2012-02-16 DIAGNOSIS — I82409 Acute embolism and thrombosis of unspecified deep veins of unspecified lower extremity: Secondary | ICD-10-CM | POA: Diagnosis not present

## 2012-02-16 DIAGNOSIS — Z7901 Long term (current) use of anticoagulants: Secondary | ICD-10-CM | POA: Diagnosis not present

## 2012-02-16 LAB — POCT INR: INR: 2.3

## 2012-02-17 DIAGNOSIS — G609 Hereditary and idiopathic neuropathy, unspecified: Secondary | ICD-10-CM | POA: Diagnosis not present

## 2012-02-17 DIAGNOSIS — R269 Unspecified abnormalities of gait and mobility: Secondary | ICD-10-CM | POA: Diagnosis not present

## 2012-02-17 DIAGNOSIS — M545 Low back pain, unspecified: Secondary | ICD-10-CM | POA: Diagnosis not present

## 2012-02-17 DIAGNOSIS — M542 Cervicalgia: Secondary | ICD-10-CM | POA: Diagnosis not present

## 2012-02-19 DIAGNOSIS — R269 Unspecified abnormalities of gait and mobility: Secondary | ICD-10-CM | POA: Diagnosis not present

## 2012-02-19 DIAGNOSIS — M545 Low back pain, unspecified: Secondary | ICD-10-CM | POA: Diagnosis not present

## 2012-02-19 DIAGNOSIS — G609 Hereditary and idiopathic neuropathy, unspecified: Secondary | ICD-10-CM | POA: Diagnosis not present

## 2012-02-19 DIAGNOSIS — M542 Cervicalgia: Secondary | ICD-10-CM | POA: Diagnosis not present

## 2012-02-23 ENCOUNTER — Ambulatory Visit (INDEPENDENT_AMBULATORY_CARE_PROVIDER_SITE_OTHER): Payer: Medicare Other | Admitting: General Practice

## 2012-02-23 DIAGNOSIS — I82409 Acute embolism and thrombosis of unspecified deep veins of unspecified lower extremity: Secondary | ICD-10-CM

## 2012-02-23 DIAGNOSIS — R269 Unspecified abnormalities of gait and mobility: Secondary | ICD-10-CM | POA: Diagnosis not present

## 2012-02-23 DIAGNOSIS — M542 Cervicalgia: Secondary | ICD-10-CM | POA: Diagnosis not present

## 2012-02-23 DIAGNOSIS — Z7901 Long term (current) use of anticoagulants: Secondary | ICD-10-CM

## 2012-02-23 DIAGNOSIS — G609 Hereditary and idiopathic neuropathy, unspecified: Secondary | ICD-10-CM | POA: Diagnosis not present

## 2012-02-23 DIAGNOSIS — M545 Low back pain, unspecified: Secondary | ICD-10-CM | POA: Diagnosis not present

## 2012-02-24 LAB — POCT INR: INR: 2.5

## 2012-02-26 DIAGNOSIS — M47817 Spondylosis without myelopathy or radiculopathy, lumbosacral region: Secondary | ICD-10-CM | POA: Diagnosis not present

## 2012-03-01 DIAGNOSIS — M542 Cervicalgia: Secondary | ICD-10-CM | POA: Diagnosis not present

## 2012-03-01 DIAGNOSIS — I749 Embolism and thrombosis of unspecified artery: Secondary | ICD-10-CM | POA: Diagnosis not present

## 2012-03-01 DIAGNOSIS — G609 Hereditary and idiopathic neuropathy, unspecified: Secondary | ICD-10-CM | POA: Diagnosis not present

## 2012-03-01 DIAGNOSIS — R269 Unspecified abnormalities of gait and mobility: Secondary | ICD-10-CM | POA: Diagnosis not present

## 2012-03-01 DIAGNOSIS — M545 Low back pain, unspecified: Secondary | ICD-10-CM | POA: Diagnosis not present

## 2012-03-01 DIAGNOSIS — Z7901 Long term (current) use of anticoagulants: Secondary | ICD-10-CM | POA: Diagnosis not present

## 2012-03-01 LAB — POCT INR: INR: 2.3

## 2012-03-05 DIAGNOSIS — M545 Low back pain, unspecified: Secondary | ICD-10-CM | POA: Diagnosis not present

## 2012-03-05 DIAGNOSIS — G609 Hereditary and idiopathic neuropathy, unspecified: Secondary | ICD-10-CM | POA: Diagnosis not present

## 2012-03-05 DIAGNOSIS — M542 Cervicalgia: Secondary | ICD-10-CM | POA: Diagnosis not present

## 2012-03-05 DIAGNOSIS — R269 Unspecified abnormalities of gait and mobility: Secondary | ICD-10-CM | POA: Diagnosis not present

## 2012-03-08 ENCOUNTER — Ambulatory Visit (INDEPENDENT_AMBULATORY_CARE_PROVIDER_SITE_OTHER): Payer: Medicare Other | Admitting: General Practice

## 2012-03-08 DIAGNOSIS — Z7901 Long term (current) use of anticoagulants: Secondary | ICD-10-CM

## 2012-03-08 DIAGNOSIS — I82409 Acute embolism and thrombosis of unspecified deep veins of unspecified lower extremity: Secondary | ICD-10-CM

## 2012-03-08 LAB — POCT INR: INR: 2.2

## 2012-03-09 DIAGNOSIS — M545 Low back pain, unspecified: Secondary | ICD-10-CM | POA: Diagnosis not present

## 2012-03-09 DIAGNOSIS — R269 Unspecified abnormalities of gait and mobility: Secondary | ICD-10-CM | POA: Diagnosis not present

## 2012-03-09 DIAGNOSIS — G609 Hereditary and idiopathic neuropathy, unspecified: Secondary | ICD-10-CM | POA: Diagnosis not present

## 2012-03-09 DIAGNOSIS — M542 Cervicalgia: Secondary | ICD-10-CM | POA: Diagnosis not present

## 2012-03-12 DIAGNOSIS — R269 Unspecified abnormalities of gait and mobility: Secondary | ICD-10-CM | POA: Diagnosis not present

## 2012-03-12 DIAGNOSIS — M542 Cervicalgia: Secondary | ICD-10-CM | POA: Diagnosis not present

## 2012-03-12 DIAGNOSIS — M545 Low back pain, unspecified: Secondary | ICD-10-CM | POA: Diagnosis not present

## 2012-03-12 DIAGNOSIS — G609 Hereditary and idiopathic neuropathy, unspecified: Secondary | ICD-10-CM | POA: Diagnosis not present

## 2012-03-16 DIAGNOSIS — G609 Hereditary and idiopathic neuropathy, unspecified: Secondary | ICD-10-CM | POA: Diagnosis not present

## 2012-03-16 DIAGNOSIS — M542 Cervicalgia: Secondary | ICD-10-CM | POA: Diagnosis not present

## 2012-03-16 DIAGNOSIS — M545 Low back pain, unspecified: Secondary | ICD-10-CM | POA: Diagnosis not present

## 2012-03-16 DIAGNOSIS — R269 Unspecified abnormalities of gait and mobility: Secondary | ICD-10-CM | POA: Diagnosis not present

## 2012-03-19 ENCOUNTER — Other Ambulatory Visit: Payer: Self-pay | Admitting: Internal Medicine

## 2012-03-19 DIAGNOSIS — M545 Low back pain, unspecified: Secondary | ICD-10-CM | POA: Diagnosis not present

## 2012-03-19 DIAGNOSIS — R269 Unspecified abnormalities of gait and mobility: Secondary | ICD-10-CM | POA: Diagnosis not present

## 2012-03-19 DIAGNOSIS — G609 Hereditary and idiopathic neuropathy, unspecified: Secondary | ICD-10-CM | POA: Diagnosis not present

## 2012-03-19 DIAGNOSIS — M542 Cervicalgia: Secondary | ICD-10-CM | POA: Diagnosis not present

## 2012-03-22 ENCOUNTER — Ambulatory Visit (INDEPENDENT_AMBULATORY_CARE_PROVIDER_SITE_OTHER): Payer: Medicare Other | Admitting: General Practice

## 2012-03-22 DIAGNOSIS — Z7901 Long term (current) use of anticoagulants: Secondary | ICD-10-CM

## 2012-03-22 DIAGNOSIS — I82409 Acute embolism and thrombosis of unspecified deep veins of unspecified lower extremity: Secondary | ICD-10-CM

## 2012-03-23 LAB — POCT INR: INR: 1.6

## 2012-03-24 DIAGNOSIS — G609 Hereditary and idiopathic neuropathy, unspecified: Secondary | ICD-10-CM | POA: Diagnosis not present

## 2012-03-24 DIAGNOSIS — R269 Unspecified abnormalities of gait and mobility: Secondary | ICD-10-CM | POA: Diagnosis not present

## 2012-03-24 DIAGNOSIS — M545 Low back pain, unspecified: Secondary | ICD-10-CM | POA: Diagnosis not present

## 2012-03-24 DIAGNOSIS — M542 Cervicalgia: Secondary | ICD-10-CM | POA: Diagnosis not present

## 2012-03-25 ENCOUNTER — Telehealth: Payer: Self-pay | Admitting: Internal Medicine

## 2012-03-25 NOTE — Telephone Encounter (Signed)
Will forward to our coumadin nurse Arline Asp

## 2012-03-25 NOTE — Telephone Encounter (Signed)
Caller: Judy/; Phone: [phone]; Reason for Call: Patient takes coumadin 5mg , 2 tabs daily except 2.  5 tab mondays, Weds, Fridays; states it appears patient has taken 1/2 tab only on M, W, F x 2 refills.  Last INR 03/23/12 1.  6 and was told to take 12.  5mg  03/23/12 and 03/24/12 then resume 10mg  q day, with follow up in 1 week.  Advised patient is being followed closely with INR's; no further questions or concerns.  Info to office for staff/provider review/follow up.  Krs/can

## 2012-03-26 DIAGNOSIS — M545 Low back pain, unspecified: Secondary | ICD-10-CM | POA: Diagnosis not present

## 2012-03-26 DIAGNOSIS — G609 Hereditary and idiopathic neuropathy, unspecified: Secondary | ICD-10-CM | POA: Diagnosis not present

## 2012-03-26 DIAGNOSIS — R269 Unspecified abnormalities of gait and mobility: Secondary | ICD-10-CM | POA: Diagnosis not present

## 2012-03-26 DIAGNOSIS — M542 Cervicalgia: Secondary | ICD-10-CM | POA: Diagnosis not present

## 2012-03-30 ENCOUNTER — Ambulatory Visit (INDEPENDENT_AMBULATORY_CARE_PROVIDER_SITE_OTHER): Payer: Medicare Other | Admitting: General Practice

## 2012-03-30 ENCOUNTER — Other Ambulatory Visit: Payer: Self-pay | Admitting: Internal Medicine

## 2012-03-30 DIAGNOSIS — I82409 Acute embolism and thrombosis of unspecified deep veins of unspecified lower extremity: Secondary | ICD-10-CM

## 2012-03-30 DIAGNOSIS — B20 Human immunodeficiency virus [HIV] disease: Secondary | ICD-10-CM

## 2012-03-30 LAB — POCT INR: INR: 3.2

## 2012-03-30 MED ORDER — RITONAVIR 100 MG PO CAPS: 100.0000 mg | ORAL_CAPSULE | ORAL | Status: DC

## 2012-04-01 ENCOUNTER — Other Ambulatory Visit: Payer: Self-pay | Admitting: Internal Medicine

## 2012-04-03 DIAGNOSIS — M545 Low back pain, unspecified: Secondary | ICD-10-CM | POA: Diagnosis not present

## 2012-04-03 DIAGNOSIS — R269 Unspecified abnormalities of gait and mobility: Secondary | ICD-10-CM | POA: Diagnosis not present

## 2012-04-03 DIAGNOSIS — G609 Hereditary and idiopathic neuropathy, unspecified: Secondary | ICD-10-CM | POA: Diagnosis not present

## 2012-04-03 DIAGNOSIS — M542 Cervicalgia: Secondary | ICD-10-CM | POA: Diagnosis not present

## 2012-04-06 DIAGNOSIS — Z79899 Other long term (current) drug therapy: Secondary | ICD-10-CM | POA: Diagnosis not present

## 2012-04-14 ENCOUNTER — Ambulatory Visit (INDEPENDENT_AMBULATORY_CARE_PROVIDER_SITE_OTHER): Payer: Medicare Other | Admitting: General Practice

## 2012-04-14 DIAGNOSIS — Z7901 Long term (current) use of anticoagulants: Secondary | ICD-10-CM | POA: Diagnosis not present

## 2012-04-14 DIAGNOSIS — I749 Embolism and thrombosis of unspecified artery: Secondary | ICD-10-CM | POA: Diagnosis not present

## 2012-04-14 DIAGNOSIS — I82409 Acute embolism and thrombosis of unspecified deep veins of unspecified lower extremity: Secondary | ICD-10-CM

## 2012-04-14 LAB — POCT INR: INR: 2.3

## 2012-04-15 DIAGNOSIS — M2550 Pain in unspecified joint: Secondary | ICD-10-CM | POA: Diagnosis not present

## 2012-04-15 DIAGNOSIS — M069 Rheumatoid arthritis, unspecified: Secondary | ICD-10-CM | POA: Diagnosis not present

## 2012-04-15 DIAGNOSIS — M810 Age-related osteoporosis without current pathological fracture: Secondary | ICD-10-CM | POA: Diagnosis not present

## 2012-04-15 DIAGNOSIS — J989 Respiratory disorder, unspecified: Secondary | ICD-10-CM | POA: Diagnosis not present

## 2012-04-19 ENCOUNTER — Ambulatory Visit (INDEPENDENT_AMBULATORY_CARE_PROVIDER_SITE_OTHER): Payer: Medicare Other | Admitting: General Practice

## 2012-04-19 DIAGNOSIS — Z7901 Long term (current) use of anticoagulants: Secondary | ICD-10-CM

## 2012-04-19 DIAGNOSIS — I82409 Acute embolism and thrombosis of unspecified deep veins of unspecified lower extremity: Secondary | ICD-10-CM

## 2012-04-19 LAB — POCT INR: INR: 2.5

## 2012-04-22 DIAGNOSIS — B351 Tinea unguium: Secondary | ICD-10-CM | POA: Diagnosis not present

## 2012-04-22 DIAGNOSIS — L851 Acquired keratosis [keratoderma] palmaris et plantaris: Secondary | ICD-10-CM | POA: Diagnosis not present

## 2012-04-22 DIAGNOSIS — I70209 Unspecified atherosclerosis of native arteries of extremities, unspecified extremity: Secondary | ICD-10-CM | POA: Diagnosis not present

## 2012-04-28 ENCOUNTER — Ambulatory Visit (INDEPENDENT_AMBULATORY_CARE_PROVIDER_SITE_OTHER): Payer: Medicare Other | Admitting: Internal Medicine

## 2012-04-28 ENCOUNTER — Encounter: Payer: Self-pay | Admitting: Internal Medicine

## 2012-04-28 VITALS — BP 120/76 | HR 73 | Temp 98.2°F | Resp 16 | Wt 188.5 lb

## 2012-04-28 DIAGNOSIS — J309 Allergic rhinitis, unspecified: Secondary | ICD-10-CM

## 2012-04-28 DIAGNOSIS — J019 Acute sinusitis, unspecified: Secondary | ICD-10-CM | POA: Diagnosis not present

## 2012-04-28 MED ORDER — LEVOFLOXACIN 500 MG PO TABS: 500.0000 mg | ORAL_TABLET | Freq: Every day | ORAL | Status: DC

## 2012-04-28 MED ORDER — HYDROCODONE-ACETAMINOPHEN 5-325 MG PO TABS: 1.0000 | ORAL_TABLET | Freq: Four times a day (QID) | ORAL | Status: DC | PRN

## 2012-04-28 MED ORDER — MOMETASONE FUROATE 50 MCG/ACT NA SUSP: 2.0000 | Freq: Every day | NASAL | Status: DC

## 2012-04-28 NOTE — Patient Instructions (Signed)

## 2012-04-30 ENCOUNTER — Encounter: Payer: Self-pay | Admitting: Internal Medicine

## 2012-04-30 NOTE — Assessment & Plan Note (Signed)
He will start nasonex ns to control the nasal allergy symptoms

## 2012-04-30 NOTE — Assessment & Plan Note (Signed)
Will treat the infection with levaquin He will take norco for the pain

## 2012-04-30 NOTE — Progress Notes (Signed)
  Subjective:    Patient ID: [name], male    DOB: 1950-08-27, 61 y.o.   MRN: [id number]  Sinusitis This is a new problem. The current episode started in the past 7 days. The problem has been gradually worsening since onset. There has been no fever. Associated symptoms include chills, congestion, sinus pressure and sneezing. Pertinent negatives include no coughing, diaphoresis, ear pain, headaches, hoarse voice, neck pain, shortness of breath, sore throat or swollen glands.      Review of Systems  Constitutional: Positive for chills. Negative for fever, diaphoresis, activity change, appetite change, fatigue and unexpected weight change.  HENT: Positive for congestion, rhinorrhea, sneezing, postnasal drip and sinus pressure. Negative for ear pain, nosebleeds, sore throat, hoarse voice, facial swelling, drooling, mouth sores, trouble swallowing, neck pain, dental problem, voice change and tinnitus.   Eyes: Negative.   Respiratory: Negative for cough and shortness of breath.   Cardiovascular: Negative.   Gastrointestinal: Negative.   Genitourinary: Negative.   Musculoskeletal: Positive for myalgias. Negative for back pain, joint swelling, arthralgias and gait problem.  Skin: Negative.   Neurological: Negative.  Negative for headaches.  Hematological: Negative for adenopathy. Does not bruise/bleed easily.       Objective:   Physical Exam  Vitals reviewed. Constitutional: He is oriented to person, place, and time. He appears well-developed and well-nourished.  Non-toxic appearance. He does not have a sickly appearance. He does not appear ill. No distress.  HENT:  Head: Normocephalic and atraumatic. No trismus in the jaw.  Right Ear: Hearing, tympanic membrane, external ear and ear canal normal.  Left Ear: Hearing, tympanic membrane, external ear and ear canal normal.  Nose: Mucosal edema and rhinorrhea present. No sinus tenderness or nasal deformity. No epistaxis. Right sinus  exhibits maxillary sinus tenderness. Right sinus exhibits no frontal sinus tenderness. Left sinus exhibits maxillary sinus tenderness. Left sinus exhibits no frontal sinus tenderness.  Mouth/Throat: Oropharynx is clear and moist and mucous membranes are normal. Mucous membranes are not pale, not dry and not cyanotic. No oral lesions. No uvula swelling. No oropharyngeal exudate, posterior oropharyngeal edema, posterior oropharyngeal erythema or tonsillar abscesses.  Eyes: Conjunctivae normal are normal. Right eye exhibits no discharge. Left eye exhibits no discharge. No scleral icterus.  Neck: Normal range of motion. Neck supple. No JVD present. No tracheal deviation present. No thyromegaly present.  Cardiovascular: Normal rate, regular rhythm, normal heart sounds and intact distal pulses.  Exam reveals no gallop and no friction rub.   No murmur heard. Pulmonary/Chest: Effort normal and breath sounds normal. No stridor. No respiratory distress. He has no wheezes. He has no rales. He exhibits no tenderness.  Abdominal: Soft. Bowel sounds are normal. He exhibits no distension and no mass. There is no tenderness. There is no rebound and no guarding.  Musculoskeletal: Normal range of motion. He exhibits no edema.  Lymphadenopathy:    He has no cervical adenopathy.  Neurological: He is oriented to person, place, and time.  Skin: Skin is warm and dry. No rash noted. He is not diaphoretic. No erythema. No pallor.  Psychiatric: He has a normal mood and affect. His behavior is normal. Judgment and thought content normal.          Assessment & Plan:

## 2012-05-06 ENCOUNTER — Ambulatory Visit (INDEPENDENT_AMBULATORY_CARE_PROVIDER_SITE_OTHER): Payer: Medicare Other | Admitting: General Practice

## 2012-05-06 DIAGNOSIS — Z7901 Long term (current) use of anticoagulants: Secondary | ICD-10-CM

## 2012-05-06 DIAGNOSIS — I82409 Acute embolism and thrombosis of unspecified deep veins of unspecified lower extremity: Secondary | ICD-10-CM

## 2012-05-06 LAB — POCT INR: INR: 2.4

## 2012-05-19 ENCOUNTER — Ambulatory Visit (INDEPENDENT_AMBULATORY_CARE_PROVIDER_SITE_OTHER): Payer: Medicare Other | Admitting: General Practice

## 2012-05-19 DIAGNOSIS — I82409 Acute embolism and thrombosis of unspecified deep veins of unspecified lower extremity: Secondary | ICD-10-CM

## 2012-05-19 DIAGNOSIS — Z7901 Long term (current) use of anticoagulants: Secondary | ICD-10-CM

## 2012-05-19 LAB — POCT INR: INR: 2.4

## 2012-05-24 ENCOUNTER — Other Ambulatory Visit: Payer: Self-pay | Admitting: Cardiology

## 2012-05-24 MED ORDER — ATORVASTATIN CALCIUM 10 MG PO TABS: 10.0000 mg | ORAL_TABLET | Freq: Every day | ORAL | Status: DC

## 2012-05-24 MED ORDER — METOPROLOL SUCCINATE ER 25 MG PO TB24: 25.0000 mg | ORAL_TABLET | Freq: Every day | ORAL | Status: DC

## 2012-06-16 ENCOUNTER — Ambulatory Visit (INDEPENDENT_AMBULATORY_CARE_PROVIDER_SITE_OTHER): Payer: Medicare Other | Admitting: General Practice

## 2012-06-16 DIAGNOSIS — Z7901 Long term (current) use of anticoagulants: Secondary | ICD-10-CM

## 2012-06-16 DIAGNOSIS — I82409 Acute embolism and thrombosis of unspecified deep veins of unspecified lower extremity: Secondary | ICD-10-CM

## 2012-06-16 LAB — POCT INR: INR: 2.5

## 2012-06-18 ENCOUNTER — Other Ambulatory Visit: Payer: Self-pay | Admitting: Neurosurgery

## 2012-06-22 ENCOUNTER — Encounter: Payer: Self-pay | Admitting: Internal Medicine

## 2012-06-22 ENCOUNTER — Other Ambulatory Visit (INDEPENDENT_AMBULATORY_CARE_PROVIDER_SITE_OTHER): Payer: Medicare Other

## 2012-06-22 ENCOUNTER — Ambulatory Visit (INDEPENDENT_AMBULATORY_CARE_PROVIDER_SITE_OTHER): Payer: Medicare Other | Admitting: Internal Medicine

## 2012-06-22 ENCOUNTER — Ambulatory Visit (INDEPENDENT_AMBULATORY_CARE_PROVIDER_SITE_OTHER): Payer: Medicare Other | Admitting: General Practice

## 2012-06-22 VITALS — BP 132/78 | HR 73 | Temp 98.3°F | Wt 187.0 lb

## 2012-06-22 DIAGNOSIS — M6281 Muscle weakness (generalized): Secondary | ICD-10-CM | POA: Diagnosis not present

## 2012-06-22 DIAGNOSIS — I82409 Acute embolism and thrombosis of unspecified deep veins of unspecified lower extremity: Secondary | ICD-10-CM

## 2012-06-22 DIAGNOSIS — Z7901 Long term (current) use of anticoagulants: Secondary | ICD-10-CM | POA: Diagnosis not present

## 2012-06-22 DIAGNOSIS — M069 Rheumatoid arthritis, unspecified: Secondary | ICD-10-CM

## 2012-06-22 DIAGNOSIS — E785 Hyperlipidemia, unspecified: Secondary | ICD-10-CM | POA: Diagnosis not present

## 2012-06-22 DIAGNOSIS — I10 Essential (primary) hypertension: Secondary | ICD-10-CM

## 2012-06-22 DIAGNOSIS — R531 Weakness: Secondary | ICD-10-CM | POA: Insufficient documentation

## 2012-06-22 DIAGNOSIS — R7309 Other abnormal glucose: Secondary | ICD-10-CM

## 2012-06-22 LAB — CBC WITH DIFFERENTIAL/PLATELET
Basophils Absolute: 0 10*3/uL (ref 0.0–0.1)
Basophils Relative: 0.2 % (ref 0.0–3.0)
Eosinophils Absolute: 0 10*3/uL (ref 0.0–0.7)
Eosinophils Relative: 0.2 % (ref 0.0–5.0)
HCT: 39.7 % (ref 39.0–52.0)
Hemoglobin: 13.2 g/dL (ref 13.0–17.0)
Lymphocytes Relative: 61.2 % — ABNORMAL HIGH (ref 12.0–46.0)
Lymphs Abs: 5.1 10*3/uL — ABNORMAL HIGH (ref 0.7–4.0)
MCHC: 33.3 g/dL (ref 30.0–36.0)
MCV: 118.9 fl — ABNORMAL HIGH (ref 78.0–100.0)
Monocytes Absolute: 0.8 10*3/uL (ref 0.1–1.0)
Monocytes Relative: 9.3 % (ref 3.0–12.0)
Neutro Abs: 2.4 10*3/uL (ref 1.4–7.7)
Neutrophils Relative %: 29.1 % — ABNORMAL LOW (ref 43.0–77.0)
Platelets: 180 10*3/uL (ref 150.0–400.0)
RBC: 3.34 Mil/uL — ABNORMAL LOW (ref 4.22–5.81)
RDW: 16.5 % — ABNORMAL HIGH (ref 11.5–14.6)
WBC: 8.3 10*3/uL (ref 4.5–10.5)

## 2012-06-22 LAB — BASIC METABOLIC PANEL
BUN: 17 mg/dL (ref 6–23)
CO2: 30 mEq/L (ref 19–32)
Calcium: 8.9 mg/dL (ref 8.4–10.5)
Chloride: 102 mEq/L (ref 96–112)
Creatinine, Ser: 0.8 mg/dL (ref 0.4–1.5)
GFR: 111.4 mL/min (ref >60.00)
Glucose, Bld: 72 mg/dL (ref 70–99)
Potassium: 4.3 mEq/L (ref 3.5–5.1)
Sodium: 137 mEq/L (ref 135–145)

## 2012-06-22 LAB — LIPID PANEL
Cholesterol: 139 mg/dL (ref 0–200)
HDL: 41.6 mg/dL (ref >39.00)
LDL Cholesterol: 79 mg/dL (ref 0–99)
Total CHOL/HDL Ratio: 3
Triglycerides: 91 mg/dL (ref 0.0–149.0)
VLDL: 18.2 mg/dL (ref 0.0–40.0)

## 2012-06-22 LAB — HEPATIC FUNCTION PANEL
ALT: 32 U/L (ref 0–53)
AST: 35 U/L (ref 0–37)
Albumin: 4.1 g/dL (ref 3.5–5.2)
Alkaline Phosphatase: 93 U/L (ref 39–117)
Bilirubin, Direct: 0.2 mg/dL (ref 0.0–0.3)
Total Bilirubin: 4.9 mg/dL — ABNORMAL HIGH (ref 0.3–1.2)
Total Protein: 6.5 g/dL (ref 6.0–8.3)

## 2012-06-22 LAB — HEMOGLOBIN A1C: Hgb A1c MFr Bld: 4.9 % (ref 4.6–6.5)

## 2012-06-22 LAB — TSH: TSH: 1.04 u[IU]/mL (ref 0.35–5.50)

## 2012-06-22 LAB — POCT INR: INR: 1.4

## 2012-06-22 NOTE — Assessment & Plan Note (Signed)
Multifactorial co-morbid illness but ?other linking dx for multiple symptoms" Increase balance issues - falling forward, R hand tremor and B hand/grip weakness, drooling and trouble swallowing, poor memroy recall (?dog's name, task list done) Will refer to neuro for review - has seen [facility] before, but requests new opinion  Reports c-spine MRI done with Dr Clarita Crane in past 12 months "normal" per pt report Check screening labs

## 2012-06-22 NOTE — Assessment & Plan Note (Signed)
BP Readings from Last 3 Encounters:  06/22/12 132/78  04/28/12 120/76  01/20/12 118/68   The current medical regimen is effective;  continue present plan and medications.

## 2012-06-22 NOTE — Assessment & Plan Note (Signed)
Changed from crestor to atorva 05/2011 due to cost Low dose atorva due to interaction with HIV meds Check lipids annually - cards to adjust t as needed (follow up next month planned)

## 2012-06-22 NOTE — Patient Instructions (Signed)
It was good to see you today. We have reviewed your prior records including labs and tests today Test(s) ordered today. Your results will be released to MyChart (or called to you) after review, usually within 72hours after test completion. If any changes need to be made, you will be notified at that same time. Medications reviewed and updated, no changes at this time. we'll make referral to new neurology and new rheumatology as requested . Our office will contact you regarding appointment(s) once made. Please schedule followup in 6 months, call sooner if problems.

## 2012-06-22 NOTE — Assessment & Plan Note (Signed)
Recurrent events 2008 and 2009 Chronic anticoag since 2009 Home INR monitoring and [facility] - reviewed warfarin dosing - goal INR 2-3 The current medical regimen is effective;  continue present plan and medications.  Lab Results  Component Value Date   INR 1.4 06/22/2012   INR 2.5 06/16/2012   INR 2.4 05/19/2012

## 2012-06-22 NOTE — Assessment & Plan Note (Signed)
Dx 2010 - follows with rheum in [place] but considering transfer to [facility], will make refer now On MTX, no acute flares Complicated by gout and osteoporosis - med mgmt reviewed The current medical regimen is effective;  continue present plan and medications.

## 2012-06-22 NOTE — Assessment & Plan Note (Signed)
On metformin for variable insulin metabolism - high and low but off since 08/2011 as "not diabetic" per a1c <5 S/p endo eval fall 2013 Check a1c now

## 2012-06-22 NOTE — Progress Notes (Signed)
Subjective:    Patient ID: [name], male    DOB: 05-09-1951, 62 y.o.   MRN: [id number]  HPI Here for 6 month follow up -reviewed chronic medical issues:  HIV - dx 1993 - follows with ID for same - counts undetectable - no hx secondary infection or complication related to same - the patient reports compliance with medication(s) as prescribed. Denies adverse side effects.  LLE DVT, recurrent event 2008 and 2009 - on chronic anticoag for same - monitors at home weekly - goal INR 2-3 - the patient reports compliance with medication(s) as prescribed. Denies adverse side effects. No bleeding or bruising  Abnormal glucose -?DM - on metformin until spring 2013 but med stopped as a1c <5 - reports hx hypoglycemia with 3h GTT and "insulin metabolism" problems for many years - would like a1c recheck now  RA and gout - follows with rheum for same, currently in [place], but would like to change to [place] - on MTX and colchicine - no recent flares - also OA with prior B TKR and shoulder surgeries  Chronic back problems - severe DDD - failure of prior rod repair and ongoing tx with NSurg Wynetta Emery) - walks with cane assist - ?"screw removed" vs rod removal summer 2014 planned - would like new neuro opinion given rapid progression of weakness -B hands and legs - in past 2-3 years - see ROS  CAD - hx MI x 2 2010 tx with stent, then CABG - no recurrent angina symptoms since CABG - the patient reports compliance with medication(s) as prescribed. Denies adverse side effects. Follows with Le[facility] cards annually  Past Medical History  Diagnosis Date  . Hypertension   . DVT, lower extremity, recurrent 2008, 2009    LLE, chronic anticoag since 2009  . Osteoarthritis, knee     s/p B TKA  . HIV infection dx 1993  . Carotid artery occlusion     40-60% right ICA stenosis (09/2008)  . Hyperlipidemia   . Coronary artery disease 2010    s/p CABG '10  . TIA (transient ischemic attack) 1997    mild residual L mouth  droop  . H/O hiatal hernia 2008    surgery  . Diabetes mellitus     no med since 08/2011  . CLL (chronic lymphoblastic leukemia) dx 2010    Followed at [facility] q13mo, no current therapy   . Gynecomastia, male   . Impotence of organic origin   . Rheumatoid arthritis dx 2010    MTX, follows with rheum  . Gout   . Chronic back pain     follows with Nsurg   Family History  Problem Relation Age of Onset  . Breast cancer Mother   . Prostate cancer Father   . Breast cancer Other   . Hypertension Other   . Hyperlipidemia Other   . Stroke Other   . Diabetes Other    History  Substance Use Topics  . Smoking status: Never Smoker   . Smokeless tobacco: Never Used     Comment: occ wine, single - married and divorced x 2; 3 kids  . Alcohol Use: Yes     Comment: occasional wine    Review of Systems  Constitutional: Positive for activity change and fatigue. Negative for fever, chills, appetite change and unexpected weight change.  Eyes: Negative for visual disturbance.  Respiratory: Positive for choking (trouble swallowing > 2 years). Negative for cough, shortness of breath and wheezing.   Cardiovascular: Negative for  chest pain, palpitations and leg swelling.  Gastrointestinal: Negative for nausea, abdominal pain, diarrhea, constipation and abdominal distention.  Musculoskeletal: Positive for myalgias, back pain, joint swelling, arthralgias and gait problem.  Skin: Negative for pallor and wound.  Allergic/Immunologic: Positive for immunocompromised state.  Neurological: Positive for tremors (R hand, with ADLs) and weakness (generalized - BLE and hands). Negative for dizziness, seizures, facial asymmetry, speech difficulty, numbness and headaches.  Psychiatric/Behavioral: Positive for decreased concentration. Negative for behavioral problems, confusion and self-injury. The patient is not nervous/anxious.        Objective:   Physical Exam BP 132/78  Pulse 73  Temp(Src) 98.3 F (36.8  C) (Oral)  Wt 187 lb (84.823 kg)  BMI 27.6 kg/m2  SpO2 96% Wt Readings from Last 3 Encounters:  06/22/12 187 lb (84.823 kg)  04/28/12 188 lb 8 oz (85.503 kg)  01/20/12 180 lb (81.647 kg)   Constitutional:  He is frail, but appears well-developed and well-nourished. No distress.  Eyes: wears corrective lenses - PERRL, EOMI Neck: Normal range of motion. Neck supple. No JVD or LAD present. No thyromegaly present.  Cardiovascular: Normal rate, regular rhythm and normal heart sounds.  No murmur heard. no BLE edema Pulmonary/Chest: Effort normal and breath sounds normal. No respiratory distress. no wheezes. Musculoskeletal: spine deformity with prominence of lumbar rod at lower T level and "bony tumor" mid T spine. tender to direct palpation - gait is forward bent (90 degrees+) at hips, aided with cane. B knees s/p TKR, no effusion - B hands with MCP radial deviation. no synovitis or effusions.  Neurological: he is alert and oriented to person, place, and time. No cranial nerve deficit. Coordination, speech and recall normal. Poor B hand grip, no detectable tremor at this time Skin: very tan - Skin is warm and dry.  No erythema or ulceration.  Psychiatric: he has a normal mood and affect. behavior is normal. Judgment and thought content normal.   Lab Results  Component Value Date   WBC 6.6 11/18/2011   HGB 12.3* 11/18/2011   HCT 35.4* 11/18/2011   PLT 172 11/18/2011   GLUCOSE 76 11/18/2011   CHOL 171 03/20/2011   TRIG 183* 03/20/2011   HDL 40 03/20/2011   LDLCALC 94 03/20/2011   ALT 23 11/18/2011   AST 28 11/18/2011   NA 145 11/18/2011   K 4.0 11/18/2011   CL 109 11/18/2011   CREATININE 0.87 11/18/2011   BUN 19 11/18/2011   CO2 30 11/18/2011   TSH 0.658 * 09/15/2008   INR 1.4 06/22/2012   HGBA1C  Value: 4.7 . 10/04/2008        Assessment & Plan:    See problem list. Medications and labs reviewed today.  Time spent with pt today 40 minutes, greater than 50% time spent counseling patient on progressive  motor weakness issues, RA/gout and spine, HIV, CAD and hx LLE DVT - also extensive medication review and interval events/consults.

## 2012-06-23 ENCOUNTER — Encounter (HOSPITAL_COMMUNITY): Payer: Self-pay

## 2012-06-23 ENCOUNTER — Ambulatory Visit: Payer: Medicare Other | Admitting: Internal Medicine

## 2012-06-23 ENCOUNTER — Telehealth: Payer: Self-pay | Admitting: General Practice

## 2012-06-23 NOTE — Telephone Encounter (Signed)
Lovenox bridge OK'd by Dr. Felicity Coyer ib 2/12. Patient to take 120 mg of Lovenox once a day.  Cr Cl - 111.8 2/16 - Last dose of coumadin 2/17 - No coumadin and No Lovenox 2/18 - Lovenox x 1 in AM (No coumadin) 2/19 - Lovenox x 1 in AM (No coumadin) 2/20 - Lovenox x 1 in AM (No coumadin) 2/21 - surgery (No Lovenox and No coumadin)

## 2012-06-25 ENCOUNTER — Ambulatory Visit: Payer: Medicare Other | Admitting: Internal Medicine

## 2012-06-28 ENCOUNTER — Encounter (HOSPITAL_COMMUNITY)
Admission: RE | Admit: 2012-06-28 | Discharge: 2012-06-28 | Disposition: A | Discharge status: Home / Self Care | Payer: Medicare Other | Source: Ambulatory Visit | Attending: Neurosurgery | Admitting: Neurosurgery

## 2012-06-28 ENCOUNTER — Encounter (HOSPITAL_COMMUNITY): Payer: Self-pay

## 2012-06-28 ENCOUNTER — Encounter (HOSPITAL_COMMUNITY): Payer: Self-pay | Admitting: Pharmacy Technician

## 2012-06-28 HISTORY — DX: Myoneural disorder, unspecified: G70.9

## 2012-06-28 HISTORY — DX: Other seasonal allergic rhinitis: J30.2

## 2012-06-28 HISTORY — DX: Other specified postprocedural states: Z98.890

## 2012-06-28 HISTORY — DX: Nausea with vomiting, unspecified: R11.2

## 2012-06-28 LAB — SURGICAL PCR SCREEN
MRSA, PCR: NEGATIVE
Staphylococcus aureus: NEGATIVE

## 2012-06-28 NOTE — Pre-Procedure Instructions (Signed)
ROLEN CONGER  06/28/2012   Your procedure is scheduled on:  Friday July 02, 2012  Report to [facility] at 8:55 AM.  Call this number if you have problems the morning of surgery: [phone]   Remember:   Do not eat food or drink liquids after midnight.   Take these medicines the morning of surgery with A SIP OF WATER: cochicine, atrovent, lamivudine, metoprolol, lyrica, raltegravir, ritonavir, valacyclovir,alendronate   Do not wear jewelry, make-up or nail polish.  Do not wear lotions, powders, or perfumes.  Do not shave 48 hours prior to surgery. Men may shave face and neck.  Do not bring valuables to the hospital.  Contacts, dentures or bridgework may not be worn into surgery.  Leave suitcase in the car. After surgery it may be brought to your room.  For patients admitted to the hospital, checkout time is 11:00 AM the day of  discharge.   Patients discharged the day of surgery will not be allowed to drive home.  Name and phone number of your driver: family / friend  Special Instructions: Shower using CHG 2 nights before surgery and the night before surgery.  If you shower the day of surgery use CHG.  Use special wash - you have one bottle of CHG for all showers.  You should use approximately 1/3 of the bottle for each shower.   Please read over the following fact sheets that you were given: Pain Booklet, Coughing and Deep Breathing, MRSA Information and Surgical Site Infection Prevention

## 2012-06-28 NOTE — Progress Notes (Signed)
06/28/12 1309  OBSTRUCTIVE SLEEP APNEA  Have you ever been diagnosed with sleep apnea through a sleep study? No  Do you snore loudly (loud enough to be heard through closed doors)?  1  Do you often feel tired, fatigued, or sleepy during the daytime? 0  Has anyone observed you stop breathing during your sleep? 0  Do you have, or are you being treated for high blood pressure? 1  BMI more than 35 kg/m2? 0  Age over 64 years old? 1  Neck circumference greater than 40 cm/18 inches? 0 (15 inches)  Gender: 1  Obstructive Sleep Apnea Score 4  Score 4 or greater  Results sent to PCP

## 2012-06-28 NOTE — Progress Notes (Signed)
Contacted Rev[facility] Standard regarding patients lab work from 06/22/12, recommended PT/PTT be drawn day of surgery. Patient to start levenox on 06/29/12 per primary Dr. Felicity Coyer.  Also forwarded cardiac records to anesthesia for review.

## 2012-06-29 DIAGNOSIS — M47817 Spondylosis without myelopathy or radiculopathy, lumbosacral region: Secondary | ICD-10-CM | POA: Diagnosis not present

## 2012-06-29 NOTE — Consult Note (Signed)
Anesthesia chart review: Patient is a 62 year old male scheduled for hardware removal by Dr. Wynetta Emery on 07/03/2011.  Orders are still pending.    History includes HIV (on valacyclovir, ritonavir, raltegravir, Combivir, atazanavir) with undtectable counts by 11/18/11 notes, HTN, arthritis, CAD/MI s/p CABG 2010, CVA, hiatal hernia, recurrent DVT LLE '08 and '09 (on Coumadin), HLD, DM2 (not requiring meds), chronic lymphocytic leukemia Rai stage 0 as of 07/2010, RA on methotrexate, gout, non-smoker, bilateral 0-39% ICA stenosis by carotid duplex on 06/09/11, previous . He is s/p right TKA on 07/22/11.  His Hematologist is Dr. Latanya Presser at [facility]. His HIV is followed by Dr. Staci Righter with [facility]. PCP is Dr. Rene Paci, last visit 2/11/4.  She is aware of planned procedure and has recommended a Lovenox bridge while patient is off his Coumadin for surgery.  He is followed by [facility], last visit with Dr. Lorine Bears on 05/20/11.  He is scheduled for yearly follow-up with Dr. Antoine Poche this month.    His last cath on 09/23/08 was prior to his CABG X 3 (LIMA to LAD, left RA to CX marginal, right RA to distal RCA) and showed 3V CAD, EF 40-45%.   Echo on 10/03/08 showed:  1. Left ventricle: The cavity size was normal. Wall thickness was normal. The estimated ejection fraction was 65%. Wall motion was normal; there were no regional wall motion abnormalities. 2. Aortic valve: Mildly thickened leaflets. Cusp separation was normal. 3. Right atrium: The atrium was mildly dilated.  EKG on 07/11/11 showed NSR, possible LAE.   CXR from 07/11/11 showed no active disease.  Labs from 06/22/12 noted.  CMET is WNL except elevated total bilirubin of 4.9 (has been 1.4-7.1 since 07/2011).  AST/ALT WNL.  WBC 8.3. Relative neutrophils decreased at 29.1, with normal NEUT # 2.4.  Relative and absolute lymphocytes were  elevated at 61.2 and 5.1, respectively.  Relative counts appear stable since at least 03/20/11.   He will get a PT/PTT on arrival.  It has been just over a year since he was last seen by cardiology, but he has recently been evaluated by his PCP who is aware of surgery and gave instructions on a Lovenox bridge.  He denied new CV symptoms at his PAT appointment and tolerated TKA within the past year.  I reviewed above with Anesthesiologist Dr. Noreene Larsson.  Plan to see if patient can be seen preoperatively by cardiology.  I notified Erie Noe at [facility].  She contacted [facility], and it appears patient's appointment with Dr. Antoine Poche has been moved up to 07/01/12 @ 1345.  I'll follow-up records once available.  Shonna Chock, PA-C 06/29/12 [phone]

## 2012-06-30 DIAGNOSIS — M810 Age-related osteoporosis without current pathological fracture: Secondary | ICD-10-CM | POA: Diagnosis not present

## 2012-06-30 DIAGNOSIS — M2550 Pain in unspecified joint: Secondary | ICD-10-CM | POA: Diagnosis not present

## 2012-06-30 DIAGNOSIS — M549 Dorsalgia, unspecified: Secondary | ICD-10-CM | POA: Diagnosis not present

## 2012-06-30 DIAGNOSIS — M79609 Pain in unspecified limb: Secondary | ICD-10-CM | POA: Diagnosis not present

## 2012-07-01 ENCOUNTER — Encounter: Payer: Self-pay | Admitting: Cardiology

## 2012-07-01 ENCOUNTER — Ambulatory Visit (INDEPENDENT_AMBULATORY_CARE_PROVIDER_SITE_OTHER): Payer: Medicare Other | Admitting: Cardiology

## 2012-07-01 VITALS — BP 130/62 | HR 75 | Ht 67.0 in | Wt 191.2 lb

## 2012-07-01 DIAGNOSIS — I6529 Occlusion and stenosis of unspecified carotid artery: Secondary | ICD-10-CM | POA: Diagnosis not present

## 2012-07-01 DIAGNOSIS — I10 Essential (primary) hypertension: Secondary | ICD-10-CM | POA: Diagnosis not present

## 2012-07-01 DIAGNOSIS — I2510 Atherosclerotic heart disease of native coronary artery without angina pectoris: Secondary | ICD-10-CM | POA: Diagnosis not present

## 2012-07-01 DIAGNOSIS — G459 Transient cerebral ischemic attack, unspecified: Secondary | ICD-10-CM | POA: Diagnosis not present

## 2012-07-01 NOTE — Patient Instructions (Addendum)
The current medical regimen is effective;  continue present plan and medications.  Follow up in 1 year with Dr Hochrein.  You will receive a letter in the mail 2 months before you are due.  Please call us when you receive this letter to schedule your follow up appointment.  

## 2012-07-01 NOTE — Progress Notes (Signed)
HPI The patient presents for followup prior to having back surgery tomorrow.  He is going to have hardware removal. They expect an overnight stay. Since we saw him last year he had knee surgery and he did well with this. He maintains anticoagulation because of his previous DVTs. He is currently on a Lovenox bridge.  The patient denies any new symptoms such as chest discomfort, neck or arm discomfort. There has been no new shortness of breath, PND or orthopnea. There have been no reported palpitations, presyncope or syncope.  Despite his back pain he is able to walk on a treadmill and pedal a bicycle which he does routinely. He has had no symptoms related to this.  Allergies  Allergen Reactions  . Morphine     REACTION: severe headache  . Other Hives    Pecan  . Oxycodone-Acetaminophen     REACTION: headache  . Peanut-Containing Drug Products   . Penicillins     REACTION: red, flushed  . Promethazine Hcl     REACTION: makes him feel drunk    Current Outpatient Prescriptions  Medication Sig Dispense Refill  . alendronate (FOSAMAX) 70 MG tablet Take 70 mg by mouth every 7 (seven) days. Take with a full glass of water on an empty stomach.      [name] aspirin EC 81 MG tablet Take 81 mg by mouth daily.      [name] atazanavir (REYATAZ) 300 MG capsule Take 300 mg by mouth daily with breakfast.      . atorvastatin (LIPITOR) 10 MG tablet Take 10 mg by mouth daily.      . B Complex-C (B-COMPLEX WITH VITAMIN C) tablet Take 1 tablet by mouth daily.      [name] CALCIUM PO Take 1,000 mg by mouth daily.      . colchicine 0.6 MG tablet Take 0.6 mg by mouth 2 (two) times daily.      [name] enoxaparin (LOVENOX) 80 MG/0.8ML injection Inject 80 mg into the skin every 12 (twelve) hours.      . folic acid (FOLVITE) 1 MG tablet Take 1 mg by mouth daily.        [name] ipratropium (ATROVENT) 0.03 % nasal spray Place 2 sprays into the nose every 12 (twelve) hours as needed for rhinitis (congestion).      [name] lamiVUDine-zidovudine  (COMBIVIR) 150-300 MG per tablet Take 1 tablet by mouth 2 (two) times daily.      . methotrexate (RHEUMATREX) 2.5 MG tablet Take 12.5 mg by mouth every Wednesday. Caution:Chemotherapy. Protect from light. Take 5 tablets once a week on Wednesdays      . metoprolol succinate (TOPROL-XL) 25 MG 24 hr tablet Take 25 mg by mouth daily.      . pregabalin (LYRICA) 200 MG capsule Take 400 mg by mouth 2 (two) times daily.      . raltegravir (ISENTRESS) 400 MG tablet Take 400 mg by mouth 2 (two) times daily.      . ritonavir (NORVIR) 100 MG capsule Take 100 mg by mouth daily.      . valACYclovir (VALTREX) 500 MG tablet Take 500 mg by mouth daily.      [name] warfarin (COUMADIN) 5 MG tablet Take 10 mg by mouth daily.        No current facility-administered medications for this visit.    Past Medical History  Diagnosis Date  . DVT, lower extremity, recurrent 2008, 2009    LLE, chronic anticoag since 2009  . Osteoarthritis, knee  s/p B TKA  . HIV infection dx 1993  . Carotid artery occlusion     40-60% right ICA stenosis (09/2008)  . Hyperlipidemia   . TIA (transient ischemic attack) 1997    mild residual L mouth droop  . H/O hiatal hernia 2008    surgery  . Diabetes mellitus     no med since 08/2011  . CLL (chronic lymphoblastic leukemia) dx 2010    Followed at [facility] q59mo, no current therapy   . Gynecomastia, male   . Impotence of organic origin   . Rheumatoid arthritis dx 2010    MTX, follows with rheum  . Gout   . Chronic back pain     follows with Nsurg  . Complication of anesthesia     nauseated  . PONV (postoperative nausea and vomiting)   . Coronary artery disease 2010    s/p CABG '10, sees Dr. Antoine Poche  . Hypertension     sees Dr. Rene Paci  . Seasonal allergies     hx of  . Bronchitis     hx of  . Myocardial infarction   . Neuromuscular disorder     diabetic neuropathy    Past Surgical History  Procedure Laterality Date  . Spine surgery  2010    "rod and  screws", "failed"  . Cholecystectomy    . Hiatal hernia repair      1 umbilical hernia repair and left and right inguinal hernia  . Shoulder surgery      rt lft repairs  . Mandible surgery      tmj  . Varicose vein      stripping  . Knee arthroplasty      left  . Tonsillectomy    . Knee arthroplasty  07/22/2011    Procedure: COMPUTER ASSISTED TOTAL KNEE ARTHROPLASTY;  Surgeon: Cammy Copa, MD;  Location: [facility] OR;  Service: Orthopedics;  Laterality: Right;  Right total knee arthroplasty  . Coronary artery bypass graft  2010    triple bypass    ROS:  As stated in the HPI and negative for all other systems.  PHYSICAL EXAM BP 130/62  Pulse 75  Ht 5\' 7"  (1.702 m)  Wt 191 lb 3.2 oz (86.728 kg)  BMI 29.94 kg/m2 GENERAL:  Well appearing HEENT:  Pupils equal round and reactive, fundi not visualized, oral mucosa unremarkable NECK:  No jugular venous distention, waveform within normal limits, carotid upstroke brisk and symmetric, no bruits, no thyromegaly LYMPHATICS:  No cervical, inguinal adenopathy LUNGS:  Clear to auscultation bilaterally BACK:  No CVA tenderness CHEST:  Well healed sternotomy scar. HEART:  PMI not displaced or sustained,S1 and S2 within normal limits, no S3, no S4, no clicks, no rubs, no murmurs ABD:  Flat, positive bowel sounds normal in frequency in pitch, no bruits, no rebound, no guarding, no midline pulsatile mass, no hepatomegaly, no splenomegaly EXT:  2 plus pulses throughout, no edema, no cyanosis no clubbing SKIN:  No rashes no nodules NEURO:  Cranial nerves II through XII grossly intact, motor grossly intact throughout PSYCH:  Cognitively intact, oriented to person place and time   EKG:  Sinus rhythm, rate 75, axis within normal limits, old anteroseptal infarct, no acute ST-T wave changes.  07/01/2012   ASSESSMENT AND PLAN  CAD The patient has had no new symptoms. He, despite his back pain, has a high functional level. He is going for low-risk  procedure from a cardiovascular standpoint. Therefore, according to ACC/AHA guidelines he is  at acceptable risk for the planned surgery without cardiovascular testing. He'll otherwise continue with secondary risk reduction.  CAROTID STENOSIS He had minimal carotid plaque in January of last year. No followup is indicated.  HYPERLIPIDEMIA I reviewed his lipid profile with an HDL of 41.6 and an LDL of 79. This is an appropriate level. No change in therapy is indicated.  HTN His blood pressure is well controlled. He will continue on the meds as listed.  DVT The patient is currently on Lovenox bridge and will resume warfarin when it is thought to be safe from a surgical standpoint.

## 2012-07-02 ENCOUNTER — Observation Stay (HOSPITAL_COMMUNITY)
Admission: RE | Admit: 2012-07-02 | Discharge: 2012-07-03 | Disposition: A | Discharge status: Home / Self Care | Payer: Medicare Other | Source: Ambulatory Visit | Attending: Neurosurgery | Admitting: Neurosurgery

## 2012-07-02 ENCOUNTER — Inpatient Hospital Stay (HOSPITAL_COMMUNITY): Payer: Medicare Other | Admitting: Anesthesiology

## 2012-07-02 ENCOUNTER — Encounter (HOSPITAL_COMMUNITY)
Admission: RE | Disposition: A | Discharge status: Home / Self Care | Payer: Self-pay | Source: Ambulatory Visit | Attending: Neurosurgery

## 2012-07-02 ENCOUNTER — Encounter (HOSPITAL_COMMUNITY): Payer: Self-pay | Admitting: Anesthesiology

## 2012-07-02 ENCOUNTER — Encounter (HOSPITAL_COMMUNITY): Payer: Self-pay | Admitting: Vascular Surgery

## 2012-07-02 DIAGNOSIS — C9111 Chronic lymphocytic leukemia of B-cell type in remission: Secondary | ICD-10-CM | POA: Insufficient documentation

## 2012-07-02 DIAGNOSIS — M549 Dorsalgia, unspecified: Secondary | ICD-10-CM | POA: Diagnosis not present

## 2012-07-02 DIAGNOSIS — Z79899 Other long term (current) drug therapy: Secondary | ICD-10-CM | POA: Insufficient documentation

## 2012-07-02 DIAGNOSIS — T8489XA Other specified complication of internal orthopedic prosthetic devices, implants and grafts, initial encounter: Secondary | ICD-10-CM | POA: Diagnosis not present

## 2012-07-02 DIAGNOSIS — Z21 Asymptomatic human immunodeficiency virus [HIV] infection status: Secondary | ICD-10-CM | POA: Insufficient documentation

## 2012-07-02 DIAGNOSIS — Y831 Surgical operation with implant of artificial internal device as the cause of abnormal reaction of the patient, or of later complication, without mention of misadventure at the time of the procedure: Secondary | ICD-10-CM | POA: Insufficient documentation

## 2012-07-02 DIAGNOSIS — I10 Essential (primary) hypertension: Secondary | ICD-10-CM | POA: Insufficient documentation

## 2012-07-02 DIAGNOSIS — M545 Low back pain, unspecified: Secondary | ICD-10-CM | POA: Diagnosis not present

## 2012-07-02 DIAGNOSIS — T84099A Other mechanical complication of unspecified internal joint prosthesis, initial encounter: Secondary | ICD-10-CM | POA: Diagnosis not present

## 2012-07-02 DIAGNOSIS — E119 Type 2 diabetes mellitus without complications: Secondary | ICD-10-CM | POA: Insufficient documentation

## 2012-07-02 DIAGNOSIS — Z981 Arthrodesis status: Secondary | ICD-10-CM | POA: Insufficient documentation

## 2012-07-02 HISTORY — PX: HARDWARE REMOVAL: SHX979

## 2012-07-02 LAB — CREATININE, SERUM
Creatinine, Ser: 0.72 mg/dL (ref 0.50–1.35)
GFR calc Af Amer: >90 mL/min (ref >90)
GFR calc non Af Amer: >90 mL/min (ref >90)

## 2012-07-02 LAB — APTT: aPTT: 30 seconds (ref 24–37)

## 2012-07-02 LAB — PROTIME-INR
INR: 1.04 (ref 0.00–1.49)
Prothrombin Time: 13.5 seconds (ref 11.6–15.2)

## 2012-07-02 SURGERY — REMOVAL, HARDWARE
Anesthesia: General | Site: Back | Wound class: Clean

## 2012-07-02 MED ORDER — SODIUM CHLORIDE 0.9 % IJ SOLN
3.0000 mL | Freq: Two times a day (BID) | INTRAMUSCULAR | Status: DC
  Administered 2012-07-02: 3 mL via INTRAVENOUS

## 2012-07-02 MED ORDER — ENOXAPARIN SODIUM 80 MG/0.8ML ~~LOC~~ SOLN
80.0000 mg | Freq: Two times a day (BID) | SUBCUTANEOUS | Status: DC
  Filled 2012-07-02: qty 0.8

## 2012-07-02 MED ORDER — WARFARIN - PHYSICIAN DOSING INPATIENT: Freq: Every day | Status: DC

## 2012-07-02 MED ORDER — WARFARIN SODIUM 10 MG PO TABS
10.0000 mg | ORAL_TABLET | Freq: Every day | ORAL | Status: DC
  Filled 2012-07-02: qty 1

## 2012-07-02 MED ORDER — HEMOSTATIC AGENTS (NO CHARGE) OPTIME
TOPICAL | Status: DC | PRN
  Administered 2012-07-02: 1 via TOPICAL

## 2012-07-02 MED ORDER — ATAZANAVIR SULFATE 150 MG PO CAPS
300.0000 mg | ORAL_CAPSULE | Freq: Every day | ORAL | Status: DC
  Administered 2012-07-03: 300 mg via ORAL
  Filled 2012-07-02 (×2): qty 2

## 2012-07-02 MED ORDER — DOCUSATE SODIUM 100 MG PO CAPS
100.0000 mg | ORAL_CAPSULE | Freq: Two times a day (BID) | ORAL | Status: DC
  Administered 2012-07-02: 100 mg via ORAL
  Filled 2012-07-02: qty 1

## 2012-07-02 MED ORDER — ARTIFICIAL TEARS OP OINT
TOPICAL_OINTMENT | OPHTHALMIC | Status: DC | PRN
  Administered 2012-07-02: 1 via OPHTHALMIC

## 2012-07-02 MED ORDER — SODIUM CHLORIDE 0.9 % IJ SOLN: 3.0000 mL | INTRAMUSCULAR | Status: DC | PRN

## 2012-07-02 MED ORDER — OXYCODONE HCL 5 MG PO TABS: 10.0000 mg | ORAL_TABLET | ORAL | Status: DC | PRN

## 2012-07-02 MED ORDER — FENTANYL CITRATE 0.05 MG/ML IJ SOLN
INTRAMUSCULAR | Status: DC | PRN
  Administered 2012-07-02 (×2): 50 ug via INTRAVENOUS
  Administered 2012-07-02: 100 ug via INTRAVENOUS

## 2012-07-02 MED ORDER — THROMBIN 5000 UNITS EX KIT
PACK | CUTANEOUS | Status: DC | PRN
  Administered 2012-07-02 (×2): 5000 [IU] via TOPICAL

## 2012-07-02 MED ORDER — LAMIVUDINE-ZIDOVUDINE 150-300 MG PO TABS
1.0000 | ORAL_TABLET | Freq: Two times a day (BID) | ORAL | Status: DC
  Administered 2012-07-02: 1 via ORAL
  Filled 2012-07-02 (×3): qty 1

## 2012-07-02 MED ORDER — MENTHOL 3 MG MT LOZG: 1.0000 | LOZENGE | OROMUCOSAL | Status: DC | PRN

## 2012-07-02 MED ORDER — SODIUM CHLORIDE 0.9 % IR SOLN
Status: DC | PRN
  Administered 2012-07-02: 12:00:00

## 2012-07-02 MED ORDER — VALACYCLOVIR HCL 500 MG PO TABS
500.0000 mg | ORAL_TABLET | Freq: Every day | ORAL | Status: DC
  Filled 2012-07-02: qty 1

## 2012-07-02 MED ORDER — ALENDRONATE SODIUM 70 MG PO TABS: 70.0000 mg | ORAL_TABLET | ORAL | Status: DC

## 2012-07-02 MED ORDER — ONDANSETRON HCL 4 MG/2ML IJ SOLN: 4.0000 mg | Freq: Once | INTRAMUSCULAR | Status: DC | PRN

## 2012-07-02 MED ORDER — 0.9 % SODIUM CHLORIDE (POUR BTL) OPTIME
TOPICAL | Status: DC | PRN
  Administered 2012-07-02: 1000 mL

## 2012-07-02 MED ORDER — PREGABALIN 50 MG PO CAPS
400.0000 mg | ORAL_CAPSULE | Freq: Two times a day (BID) | ORAL | Status: DC
  Administered 2012-07-02: 400 mg via ORAL
  Filled 2012-07-02: qty 8

## 2012-07-02 MED ORDER — ATORVASTATIN CALCIUM 10 MG PO TABS
10.0000 mg | ORAL_TABLET | Freq: Every day | ORAL | Status: DC
  Filled 2012-07-02: qty 1

## 2012-07-02 MED ORDER — HYDROMORPHONE HCL 2 MG PO TABS: 2.0000 mg | ORAL_TABLET | ORAL | Status: DC | PRN

## 2012-07-02 MED ORDER — SUCCINYLCHOLINE CHLORIDE 20 MG/ML IJ SOLN
INTRAMUSCULAR | Status: DC | PRN
  Administered 2012-07-02: 100 mg via INTRAVENOUS

## 2012-07-02 MED ORDER — VANCOMYCIN HCL IN DEXTROSE 1-5 GM/200ML-% IV SOLN
1000.0000 mg | Freq: Two times a day (BID) | INTRAVENOUS | Status: DC
  Administered 2012-07-02: 1000 mg via INTRAVENOUS
  Filled 2012-07-02 (×3): qty 200

## 2012-07-02 MED ORDER — BUPIVACAINE HCL (PF) 0.25 % IJ SOLN
INTRAMUSCULAR | Status: DC | PRN
  Administered 2012-07-02: 20 mL

## 2012-07-02 MED ORDER — GLYCOPYRROLATE 0.2 MG/ML IJ SOLN
INTRAMUSCULAR | Status: DC | PRN
  Administered 2012-07-02: .4 mg via INTRAVENOUS

## 2012-07-02 MED ORDER — B COMPLEX-C PO TABS
1.0000 | ORAL_TABLET | Freq: Every day | ORAL | Status: DC
  Filled 2012-07-02: qty 1

## 2012-07-02 MED ORDER — METHOTREXATE 2.5 MG PO TABS: 12.5000 mg | ORAL_TABLET | ORAL | Status: DC

## 2012-07-02 MED ORDER — ONDANSETRON HCL 4 MG/2ML IJ SOLN
4.0000 mg | Freq: Four times a day (QID) | INTRAMUSCULAR | Status: DC | PRN
  Administered 2012-07-02: 4 mg via INTRAVENOUS
  Filled 2012-07-02: qty 2

## 2012-07-02 MED ORDER — IPRATROPIUM BROMIDE 0.03 % NA SOLN: 2.0000 | Freq: Two times a day (BID) | NASAL | Status: DC | PRN

## 2012-07-02 MED ORDER — HYDROMORPHONE HCL PF 1 MG/ML IJ SOLN: 0.2500 mg | INTRAMUSCULAR | Status: DC | PRN

## 2012-07-02 MED ORDER — RALTEGRAVIR POTASSIUM 400 MG PO TABS
400.0000 mg | ORAL_TABLET | Freq: Two times a day (BID) | ORAL | Status: DC
  Administered 2012-07-02: 400 mg via ORAL
  Filled 2012-07-02 (×3): qty 1

## 2012-07-02 MED ORDER — FOLIC ACID 1 MG PO TABS
1.0000 mg | ORAL_TABLET | Freq: Every day | ORAL | Status: DC
  Filled 2012-07-02: qty 1

## 2012-07-02 MED ORDER — LACTATED RINGERS IV SOLN
INTRAVENOUS | Status: DC | PRN
  Administered 2012-07-02 (×2): via INTRAVENOUS

## 2012-07-02 MED ORDER — ACETAMINOPHEN 325 MG PO TABS
650.0000 mg | ORAL_TABLET | ORAL | Status: DC | PRN
  Administered 2012-07-02: 650 mg via ORAL
  Filled 2012-07-02: qty 2

## 2012-07-02 MED ORDER — CYCLOBENZAPRINE HCL 10 MG PO TABS: 10.0000 mg | ORAL_TABLET | Freq: Three times a day (TID) | ORAL | Status: DC | PRN

## 2012-07-02 MED ORDER — SODIUM CHLORIDE 0.9 % IV SOLN
INTRAVENOUS | Status: AC
  Filled 2012-07-02: qty 500

## 2012-07-02 MED ORDER — LIDOCAINE HCL (CARDIAC) 20 MG/ML IV SOLN
INTRAVENOUS | Status: DC | PRN
  Administered 2012-07-02: 100 mg via INTRAVENOUS

## 2012-07-02 MED ORDER — RITONAVIR 100 MG PO CAPS
100.0000 mg | ORAL_CAPSULE | Freq: Every day | ORAL | Status: DC
  Administered 2012-07-03: 100 mg via ORAL
  Filled 2012-07-02 (×2): qty 1

## 2012-07-02 MED ORDER — METOPROLOL SUCCINATE ER 25 MG PO TB24
25.0000 mg | ORAL_TABLET | Freq: Every day | ORAL | Status: DC
  Filled 2012-07-02: qty 1

## 2012-07-02 MED ORDER — ONDANSETRON HCL 4 MG/2ML IJ SOLN
INTRAMUSCULAR | Status: DC | PRN
  Administered 2012-07-02 (×2): 4 mg via INTRAVENOUS

## 2012-07-02 MED ORDER — VANCOMYCIN HCL IN DEXTROSE 1-5 GM/200ML-% IV SOLN
INTRAVENOUS | Status: AC
  Administered 2012-07-02: 1000 mg via INTRAVENOUS
  Filled 2012-07-02: qty 200

## 2012-07-02 MED ORDER — PHENOL 1.4 % MT LIQD: 1.0000 | OROMUCOSAL | Status: DC | PRN

## 2012-07-02 MED ORDER — PROPOFOL 10 MG/ML IV BOLUS
INTRAVENOUS | Status: DC | PRN
  Administered 2012-07-02: 200 mg via INTRAVENOUS
  Administered 2012-07-02: 50 mg via INTRAVENOUS

## 2012-07-02 MED ORDER — BACITRACIN 50000 UNITS IM SOLR
INTRAMUSCULAR | Status: AC
  Filled 2012-07-02: qty 1

## 2012-07-02 MED ORDER — ASPIRIN EC 81 MG PO TBEC
81.0000 mg | DELAYED_RELEASE_TABLET | Freq: Every day | ORAL | Status: DC
  Filled 2012-07-02: qty 1

## 2012-07-02 MED ORDER — PHENYLEPHRINE HCL 10 MG/ML IJ SOLN
INTRAMUSCULAR | Status: DC | PRN
  Administered 2012-07-02 (×2): 40 ug via INTRAVENOUS
  Administered 2012-07-02: 80 ug via INTRAVENOUS

## 2012-07-02 MED ORDER — TRAMADOL HCL 50 MG PO TABS
50.0000 mg | ORAL_TABLET | Freq: Four times a day (QID) | ORAL | Status: DC | PRN
  Filled 2012-07-02: qty 1

## 2012-07-02 MED ORDER — SODIUM CHLORIDE 0.9 % IV SOLN: 250.0000 mL | INTRAVENOUS | Status: DC

## 2012-07-02 MED ORDER — COLCHICINE 0.6 MG PO TABS
0.6000 mg | ORAL_TABLET | Freq: Two times a day (BID) | ORAL | Status: DC
  Administered 2012-07-02: 0.6 mg via ORAL
  Filled 2012-07-02 (×3): qty 1

## 2012-07-02 MED FILL — Thrombin For Soln 5000 Unit: CUTANEOUS | Qty: 5000 | Status: AC

## 2012-07-02 SURGICAL SUPPLY — 52 items
BAG DECANTER FOR FLEXI CONT (MISCELLANEOUS) ×2 IMPLANT
BENZOIN TINCTURE PRP APPL 2/3 (GAUZE/BANDAGES/DRESSINGS) ×2 IMPLANT
BLADE SURG ROTATE 9660 (MISCELLANEOUS) IMPLANT
BRUSH SCRUB EZ PLAIN DRY (MISCELLANEOUS) ×2 IMPLANT
BUR MATCHSTICK NEURO 3.0 LAGG (BURR) ×2 IMPLANT
BUR PRECISION FLUTE 6.0 (BURR) IMPLANT
CANISTER SUCTION 2500CC (MISCELLANEOUS) IMPLANT
CLOTH BEACON ORANGE TIMEOUT ST (SAFETY) ×2 IMPLANT
CONT SPEC 4OZ CLIKSEAL STRL BL (MISCELLANEOUS) ×2 IMPLANT
DECANTER SPIKE VIAL GLASS SM (MISCELLANEOUS) ×2 IMPLANT
DERMABOND ADVANCED (GAUZE/BANDAGES/DRESSINGS) ×1
DERMABOND ADVANCED .7 DNX12 (GAUZE/BANDAGES/DRESSINGS) ×1 IMPLANT
DRAPE LAPAROTOMY 100X72X124 (DRAPES) ×2 IMPLANT
DRAPE POUCH INSTRU U-SHP 10X18 (DRAPES) ×2 IMPLANT
DRAPE PROXIMA HALF (DRAPES) IMPLANT
DRAPE SURG 17X23 STRL (DRAPES) ×2 IMPLANT
DRSG OPSITE 4X5.5 SM (GAUZE/BANDAGES/DRESSINGS) ×2 IMPLANT
ELECT REM PT RETURN 9FT ADLT (ELECTROSURGICAL) ×2
ELECTRODE REM PT RTRN 9FT ADLT (ELECTROSURGICAL) ×1 IMPLANT
EVACUATOR 3/16  PVC DRAIN (DRAIN) ×1
EVACUATOR 3/16 PVC DRAIN (DRAIN) ×1 IMPLANT
GAUZE SPONGE 4X4 16PLY XRAY LF (GAUZE/BANDAGES/DRESSINGS) IMPLANT
GLOVE BIO SURGEON STRL SZ8 (GLOVE) ×2 IMPLANT
GLOVE EXAM NITRILE LRG STRL (GLOVE) IMPLANT
GLOVE EXAM NITRILE MD LF STRL (GLOVE) IMPLANT
GLOVE EXAM NITRILE XL STR (GLOVE) IMPLANT
GLOVE EXAM NITRILE XS STR PU (GLOVE) IMPLANT
GLOVE INDICATOR 7.0 STRL GRN (GLOVE) ×2 IMPLANT
GLOVE INDICATOR 8.5 STRL (GLOVE) ×2 IMPLANT
GOWN BRE IMP SLV AUR LG STRL (GOWN DISPOSABLE) ×2 IMPLANT
GOWN BRE IMP SLV AUR XL STRL (GOWN DISPOSABLE) ×2 IMPLANT
GOWN STRL REIN 2XL LVL4 (GOWN DISPOSABLE) IMPLANT
KIT BASIN OR (CUSTOM PROCEDURE TRAY) ×2 IMPLANT
KIT ROOM TURNOVER OR (KITS) ×2 IMPLANT
NEEDLE HYPO 22GX1.5 SAFETY (NEEDLE) ×2 IMPLANT
NEEDLE SPNL 22GX3.5 QUINCKE BK (NEEDLE) ×2 IMPLANT
NS IRRIG 1000ML POUR BTL (IV SOLUTION) ×2 IMPLANT
PACK LAMINECTOMY NEURO (CUSTOM PROCEDURE TRAY) ×2 IMPLANT
RASP 3.0MM (RASP) ×2 IMPLANT
SPONGE GAUZE 4X4 12PLY (GAUZE/BANDAGES/DRESSINGS) ×2 IMPLANT
SPONGE SURGIFOAM ABS GEL SZ50 (HEMOSTASIS) ×2 IMPLANT
STRIP CLOSURE SKIN 1/2X4 (GAUZE/BANDAGES/DRESSINGS) ×2 IMPLANT
SUT VIC AB 0 CT1 18XCR BRD8 (SUTURE) ×1 IMPLANT
SUT VIC AB 0 CT1 8-18 (SUTURE) ×1
SUT VIC AB 2-0 CT1 18 (SUTURE) ×4 IMPLANT
SUT VICRYL 4-0 PS2 18IN ABS (SUTURE) ×2 IMPLANT
SWAB CULTURE LIQ STUART DBL (MISCELLANEOUS) ×6 IMPLANT
SYR 20ML ECCENTRIC (SYRINGE) ×2 IMPLANT
TOWEL OR 17X24 6PK STRL BLUE (TOWEL DISPOSABLE) ×2 IMPLANT
TOWEL OR 17X26 10 PK STRL BLUE (TOWEL DISPOSABLE) ×2 IMPLANT
TUBE ANAEROBIC SPECIMEN COL (MISCELLANEOUS) ×6 IMPLANT
WATER STERILE IRR 1000ML POUR (IV SOLUTION) ×2 IMPLANT

## 2012-07-02 NOTE — Transfer of Care (Signed)
Immediate Anesthesia Transfer of Care Note  Patient: [name]  Procedure(s) Performed: Procedure(s): HARDWARE REMOVAL (N/A)  Patient Location: PACU  Anesthesia Type:General  Level of Consciousness: awake, alert  and pateint uncooperative  Airway & Oxygen Therapy: Patient Spontanous Breathing and Patient connected to nasal cannula oxygen  Post-op Assessment: Report given to PACU RN, Post -op Vital signs reviewed and stable and Patient moving all extremities X 4  Post vital signs: Reviewed and stable  Complications: No apparent anesthesia complications

## 2012-07-02 NOTE — H&P (Signed)
[name] is an 62 y.o. male.   Chief Complaint: Painful hardware HPI: Patient is a 56 gentleman who underwent a L2-S1 fusion along time ago but over time has had progressive displacement of one of the screw heads and the left upper to irritating the skin as well as the spinous process at L1 is also giving him some pain irritation and of the overlying skin. Patient requested removal of the screw and spinous process to allow some alleviation of his pain. Workup has revealed a solid fusion at L2 with extensive amount of bridging osteophytes and what appeared to be intervening bone can hold them in that kyphotic and scoliotic posture. He does have a protrusion of the L2 screw throughout the left side of his back as well as extensor protuberance the protuberance of his L1 spinous process. Because of the progressive pain especially when sitting and it is patient has requested removal of Cerebyx as an of the wrist and benefits of hardware removal and removal of spinous process with him as well as perioperative course expectations of outcome alternatives of surgery he understands and wants to proceed forward.  Past Medical History  Diagnosis Date  . DVT, lower extremity, recurrent 2008, 2009    LLE, chronic anticoag since 2009  . Osteoarthritis, knee     s/p B TKA  . HIV infection dx 1993  . Carotid artery occlusion     40-60% right ICA stenosis (09/2008)  . Hyperlipidemia   . TIA (transient ischemic attack) 1997    mild residual L mouth droop  . H/O hiatal hernia 2008    surgery  . Diabetes mellitus     no med since 08/2011  . CLL (chronic lymphoblastic leukemia) dx 2010    Followed at [facility] q59mo, no current therapy   . Gynecomastia, male   . Impotence of organic origin   . Rheumatoid arthritis dx 2010    MTX, follows with rheum  . Gout   . Chronic back pain     follows with Nsurg  . Complication of anesthesia     nauseated  . PONV (postoperative nausea and vomiting)   . Coronary  artery disease 2010    s/p CABG '10, sees Dr. Antoine Poche  . Hypertension     sees Dr. Rene Paci  . Seasonal allergies     hx of  . Bronchitis     hx of  . Myocardial infarction   . Neuromuscular disorder     diabetic neuropathy    Past Surgical History  Procedure Laterality Date  . Spine surgery  2010    "rod and screws", "failed"  . Cholecystectomy    . Hiatal hernia repair      1 umbilical hernia repair and left and right inguinal hernia  . Shoulder surgery      rt lft repairs  . Mandible surgery      tmj  . Varicose vein      stripping  . Knee arthroplasty      left  . Tonsillectomy    . Knee arthroplasty  07/22/2011    Procedure: COMPUTER ASSISTED TOTAL KNEE ARTHROPLASTY;  Surgeon: Cammy Copa, MD;  Location: [facility] OR;  Service: Orthopedics;  Laterality: Right;  Right total knee arthroplasty  . Coronary artery bypass graft  2010    triple bypass    Family History  Problem Relation Age of Onset  . Breast cancer Mother   . Prostate cancer Father   . Breast cancer  Other   . Hypertension Other   . Hyperlipidemia Other   . Stroke Other   . Diabetes Other    Social History:  reports that he has never smoked. He has never used smokeless tobacco. He reports that  drinks alcohol. He reports that he does not use illicit drugs.  Allergies:  Allergies  Allergen Reactions  . Morphine     REACTION: severe headache  . Other Hives    Pecan  . Oxycodone-Acetaminophen     REACTION: headache  . Peanut-Containing Drug Products   . Penicillins     REACTION: red, flushed  . Promethazine Hcl     REACTION: makes him feel drunk    Medications Prior to Admission  Medication Sig Dispense Refill  . alendronate (FOSAMAX) 70 MG tablet Take 70 mg by mouth every 7 (seven) days. Take with a full glass of water on an empty stomach.      [name] aspirin EC 81 MG tablet Take 81 mg by mouth daily.      [name] atazanavir (REYATAZ) 300 MG capsule Take 300 mg by mouth daily with  breakfast.      . atorvastatin (LIPITOR) 10 MG tablet Take 10 mg by mouth daily.      . B Complex-C (B-COMPLEX WITH VITAMIN C) tablet Take 1 tablet by mouth daily.      [name] CALCIUM PO Take 1,000 mg by mouth daily.      . colchicine 0.6 MG tablet Take 0.6 mg by mouth 2 (two) times daily.      [name] enoxaparin (LOVENOX) 80 MG/0.8ML injection Inject 80 mg into the skin every 12 (twelve) hours.      . folic acid (FOLVITE) 1 MG tablet Take 1 mg by mouth daily.        [name] ipratropium (ATROVENT) 0.03 % nasal spray Place 2 sprays into the nose every 12 (twelve) hours as needed for rhinitis (congestion).      [name] lamiVUDine-zidovudine (COMBIVIR) 150-300 MG per tablet Take 1 tablet by mouth 2 (two) times daily.      . methotrexate (RHEUMATREX) 2.5 MG tablet Take 12.5 mg by mouth every Wednesday. Caution:Chemotherapy. Protect from light. Take 5 tablets once a week on Wednesdays      . metoprolol succinate (TOPROL-XL) 25 MG 24 hr tablet Take 25 mg by mouth daily.      . pregabalin (LYRICA) 200 MG capsule Take 400 mg by mouth 2 (two) times daily.      . raltegravir (ISENTRESS) 400 MG tablet Take 400 mg by mouth 2 (two) times daily.      . ritonavir (NORVIR) 100 MG capsule Take 100 mg by mouth daily.      . valACYclovir (VALTREX) 500 MG tablet Take 500 mg by mouth daily.      [name] warfarin (COUMADIN) 5 MG tablet Take 10 mg by mouth daily.         Results for orders placed during the hospital encounter of 07/02/12 (from the past 48 hour(s))  APTT     Status: None   Collection Time    07/02/12  9:09 AM      Result Value Range   aPTT 30  24 - 37 seconds  PROTIME-INR     Status: None   Collection Time    07/02/12  9:09 AM      Result Value Range   Prothrombin Time 13.5  11.6 - 15.2 seconds   INR 1.04  0.00 - 1.49   No results  found.  Review of Systems  Constitutional: Negative.   HENT: Positive for neck pain.   Eyes: Negative.   Respiratory: Negative.   Cardiovascular: Negative.   Gastrointestinal:  Negative.   Genitourinary: Negative.   Musculoskeletal: Positive for myalgias, back pain, joint pain and falls.  Skin: Negative.   Neurological: Positive for tingling, sensory change and focal weakness.  Endo/Heme/Allergies: Negative.   Psychiatric/Behavioral: Negative.     Blood pressure 124/81, pulse 73, temperature 97.5 F (36.4 C), temperature source Oral, resp. rate 18, SpO2 100.00%. Physical Exam  Constitutional: He is oriented to person, place, and time. He appears well-developed and well-nourished.  HENT:  Head: Normocephalic.  Eyes: Pupils are equal, round, and reactive to light.  Neck: Normal range of motion.  Cardiovascular: Normal rate.   Respiratory: Effort normal.  GI: Soft.  Neurological: He is alert and oriented to person, place, and time. He displays a negative Romberg sign. GCS eye subscore is 4. GCS verbal subscore is 5. GCS motor subscore is 6.  Reflex Scores:      Tricep reflexes are 2+ on the right side and 2+ on the left side.      Bicep reflexes are 2+ on the right side and 2+ on the left side.      Brachioradialis reflexes are 2+ on the right side and 2+ on the left side.      Patellar reflexes are 0 on the right side and 0 on the left side.      Achilles reflexes are 0 on the right side and 0 on the left side. Patient is diffusely weak in his lower 70s at 4+ out of 5 mildly so. Iliopsoas quads and she's gastrocs EHL are all:[name] He does maintain somewhat of her kyphotic and lateral posture consistent with his kyphosis and scoliosis above the level of his previous fusion.     Assessment/Plan 62 year old gentleman presents for removal of hardware and removal of the L1 spinous process.  [name] P 07/02/2012, 10:16 AM

## 2012-07-02 NOTE — Anesthesia Preprocedure Evaluation (Signed)
Anesthesia Evaluation  Patient identified by MRN, date of birth, ID band Patient awake    Reviewed: Allergy & Precautions, H&P , NPO status , Patient's Chart, lab work & pertinent test results  History of Anesthesia Complications (+) PONV  Airway Mallampati: I TM Distance: >3 FB Neck ROM: full    Dental   Pulmonary          Cardiovascular hypertension, + CAD, + Past MI, + CABG, + Peripheral Vascular Disease and DVT Rhythm:regular Rate:Normal     Neuro/Psych TIA Neuromuscular disease    GI/Hepatic hiatal hernia,   Endo/Other  diabetes, Type 2  Renal/GU      Musculoskeletal  (+) Arthritis -,   Abdominal   Peds  Hematology  (+) Blood dyscrasia, ,   Anesthesia Other Findings   Reproductive/Obstetrics                           Anesthesia Physical Anesthesia Plan  ASA: III  Anesthesia Plan: General   Post-op Pain Management:    Induction: Intravenous  Airway Management Planned: Oral ETT  Additional Equipment:   Intra-op Plan:   Post-operative Plan: Extubation in OR  Informed Consent: I have reviewed the patients History and Physical, chart, labs and discussed the procedure including the risks, benefits and alternatives for the proposed anesthesia with the patient or authorized representative who has indicated his/her understanding and acceptance.     Plan Discussed with: CRNA, Anesthesiologist and Surgeon  Anesthesia Plan Comments:         Anesthesia Quick Evaluation

## 2012-07-02 NOTE — Anesthesia Procedure Notes (Signed)
Procedure Name: Intubation Date/Time: 07/02/2012 10:50 AM Performed by: Sherie Don Pre-anesthesia Checklist: Patient identified, Emergency Drugs available, Suction available, Patient being monitored and Timeout performed Patient Re-evaluated:Patient Re-evaluated prior to inductionOxygen Delivery Method: Circle system utilized Preoxygenation: Pre-oxygenation with 100% oxygen Intubation Type: IV induction Ventilation: Mask ventilation without difficulty Laryngoscope Size: Mac and 3 Grade View: Grade II Tube size: 7.5 mm Number of attempts: 1 Airway Equipment and Method: Stylet and LTA kit utilized Placement Confirmation: ETT inserted through vocal cords under direct vision,  positive ETCO2 and breath sounds checked- equal and bilateral Secured at: 22 cm Tube secured with: Tape Dental Injury: Teeth and Oropharynx as per pre-operative assessment

## 2012-07-02 NOTE — Progress Notes (Signed)
Attempted to call Dr. Lonie Peak office secretary and Erie Noe, scheduler to clarify consent, messages left.  Pt verbally confirms surgery to be "removal of hardware in back."  Pt has no family accompanying him, belongs labeled and sent to holding area with him.

## 2012-07-02 NOTE — Anesthesia Postprocedure Evaluation (Signed)
  Anesthesia Post-op Note  Patient: [name]  Procedure(s) Performed: Procedure(s): HARDWARE REMOVAL (N/A)  Patient Location: PACU  Anesthesia Type:General  Level of Consciousness: awake, oriented and patient cooperative  Airway and Oxygen Therapy: Patient Spontanous Breathing  Post-op Pain: mild  Post-op Assessment: Post-op Vital signs reviewed, Patient's Cardiovascular Status Stable, Respiratory Function Stable, Patent Airway, No signs of Nausea or vomiting and Pain level controlled  Post-op Vital Signs: stable  Complications: No apparent anesthesia complications

## 2012-07-02 NOTE — Progress Notes (Signed)
PHARMACIST - PHYSICIAN COMMUNICATION DR:  Wynetta Emery CONCERNING: Pharmacy Care Issues Regarding Warfarin Labs  RECOMMENDATION (Action Taken): A baseline and daily protime for three days has been ordered to meet the [facility] Patient safety goal and comply with the current [facility] Therapeutics Committee policy.   The Pharmacy will defer all warfarin dose order changes and follow up of lab results to the prescriber unless an additional order to initiate a "pharmacy Coumadin consult" is placed.  DESCRIPTION:  While hospitalized, to be in compliance with The Joint Commission National Patient Safety Goals, all patients on warfarin must have a baseline and/or current protime prior to the administration of warfarin. Pharmacy has received your order for warfarin without these required laboratory assessments. ________________________________________________ PHARMACIST - PHYSICIAN COMMUNICATION  CONCERNING: P&T Medication Policy Regarding Oral Bisphosphonates  RECOMMENDATION: Your order for alendronate (Fosamax), ibandronate (Boniva), or risedronate (Actonel) has been discontinued at this time.  If the patient's post-hospital medical condition warrants safe use of this class of drugs, please resume the pre-hospital regimen upon discharge.  DESCRIPTION:  Alendronate (Fosamax), ibandronate (Boniva), and risedronate (Actonel) can cause severe esophageal erosions in patients who are unable to remain upright at least 30 minutes after taking this medication.   Since brief interruptions in therapy are thought to have minimal impact on bone mineral density, the Pharmacy & Therapeutics Committee has established that bisphosphonate orders should be routinely discontinued during hospitalization.   To override this safety policy and permit administration of Boniva, Fosamax, or Actonel in the hospital, prescribers must write "DO NOT HOLD" in the comments section when placing the order for this  class of medications.  Celedonio Miyamoto, PharmD, BCPS Clinical Pharmacist Pager [phone]

## 2012-07-02 NOTE — Op Note (Signed)
Preoperative diagnosis: Painful hardware and painful L1 spinous process  Postoperative diagnosis: Same  Procedure: Exploration of fusion L2 with removal of left L2 pedicle screw and removal of L1 spinous process with removal of the pangeia synthes  Surgeon: Jillyn Hidden Vanna Sailer  Anesthesia: Gen.  EBL: Minimal  History of present illness: Patient is a very pleasant 67 to gentleman who had undergone previous L2-S1 fusion many many years ago start of swelling around his left L2 screw was causing a lot of pain and discomfort as well as pain around the L1 spinous process which was protruding through the skin. Workup and imaging revealed solid fusion at L2 below as well as bridging osteophytes at. Helped his kyphotic and scoliotic position endplates of of his fusion. Due to pain he is experiencing from the hardware itself the swelling and erythema and edema around it he asked for him we have agreed to remove the left L2 pedicle screw and removal of the L1 spinous process. Extensively reviewed the risks benefits of the operation with her as well as parapatellar suspect is supple to surgery he understood and agreed to proceed forward.  Operative procedure: Patient brought into the or was induced under general anesthesia positioned prone the Wilson frame his back was prepped and draped in routine sterile fashion the area over the L1 spinous process extending down to his old midline incision just below the level of the L2 pedicle screw was incised glucose decongestants and subperiosteal was dissected carried out around the L1 spinous process and dissection was carried out the hardware on the left L2. The screw was immediately identified as well as the ends the rod then using a titanium cutting drill bit the rod was cut between the L2 and L3 pedicle screw and rod was removed a screw that was removed and the screw head was removed. The hole was then waxed and there was extensive amount of edematous fluid as well as some  clear fluid coming from around the hardware mixed with some granulation tissue all this was sent for culture but did not appear infected. The more  seromatous inflammatory fluid. Then the L1 spinous process was removed and contoured all bleeding surfaces were coagulated waxed the wounds and copiously irrigated meticulous hemostasis was maintained a large director was placed the wounds closed in layers with after Vicryl the subcutaneous tissue and a running 4 subcuticular in the skin benzoin Steri-Strips applied patient recovered in stable condition. At the end of case on it counts sponge counts were correct.

## 2012-07-02 NOTE — Preoperative (Addendum)
Beta Blockers   Reason not to administer Beta Blockers: beta blocker taken last pm

## 2012-07-03 LAB — PROTIME-INR
INR: 1.13 (ref 0.00–1.49)
Prothrombin Time: 14.3 seconds (ref 11.6–15.2)

## 2012-07-03 MED ORDER — TRAMADOL HCL 50 MG PO TABS: 50.0000 mg | ORAL_TABLET | Freq: Four times a day (QID) | ORAL | Status: DC | PRN

## 2012-07-03 NOTE — Discharge Summary (Signed)
Physician Discharge Summary  Patient ID: [name] MRN: [id number] DOB/AGE: 64/04/1951 64 y.o.  Admit date: 07/02/2012 Discharge date: 07/03/2012  Admission Diagnoses: Painful hardware  Discharge Diagnoses: Same Active Problems:   * No active hospital problems. *   Discharged Condition: good  Hospital Course: Patient admitted hospital underwent excision of the L1 spinous process and removal of displaced left L2 pedicle screw postoperatively patient did very well recovered in the floor on the floor he was angling well tolerating a regular diet voiding spontaneously and pain was well controlled on pills he was still be discharged home.  Consults: Significant Diagnostic Studies: Treatments: Removal of left L2 screw and L1 spinous process Discharge Exam: Blood pressure 119/70, pulse 76, temperature 98.9 F (37.2 C), temperature source Oral, resp. rate 16, SpO2 95.00%. Strength 5 out of 5 wound clean and dry  Disposition: Home   Future Appointments Provider Department Dept Phone   07/07/2012 2:30 PM Casimiro Needle L. Smiley Houseman, MD [facility] Lucrezia Starch [phone]       Medication List    TAKE these medications       alendronate 70 MG tablet  Commonly known as:  FOSAMAX  Take 70 mg by mouth every 7 (seven) days. Take with a full glass of water on an empty stomach.     aspirin EC 81 MG tablet  Take 81 mg by mouth daily.     atazanavir 300 MG capsule  Commonly known as:  REYATAZ  Take 300 mg by mouth daily with breakfast.     atorvastatin 10 MG tablet  Commonly known as:  LIPITOR  Take 10 mg by mouth daily.     B-complex with vitamin C tablet  Take 1 tablet by mouth daily.     CALCIUM PO  Take 1,000 mg by mouth daily.     colchicine 0.6 MG tablet  Take 0.6 mg by mouth 2 (two) times daily.     enoxaparin 80 MG/0.8ML injection  Commonly known as:  LOVENOX  Inject 80 mg into the skin every 12 (twelve) hours.     folic acid 1 MG tablet  Commonly known as:   FOLVITE  Take 1 mg by mouth daily.     ipratropium 0.03 % nasal spray  Commonly known as:  ATROVENT  Place 2 sprays into the nose every 12 (twelve) hours as needed for rhinitis (congestion).     lamiVUDine-zidovudine 150-300 MG per tablet  Commonly known as:  COMBIVIR  Take 1 tablet by mouth 2 (two) times daily.     methotrexate 2.5 MG tablet  Commonly known as:  RHEUMATREX  Take 12.5 mg by mouth every Wednesday. Caution:Chemotherapy. Protect from light.  Take 5 tablets once a week on Wednesdays     metoprolol succinate 25 MG 24 hr tablet  Commonly known as:  TOPROL-XL  Take 25 mg by mouth daily.     pregabalin 200 MG capsule  Commonly known as:  LYRICA  Take 400 mg by mouth 2 (two) times daily.     raltegravir 400 MG tablet  Commonly known as:  ISENTRESS  Take 400 mg by mouth 2 (two) times daily.     ritonavir 100 MG capsule  Commonly known as:  NORVIR  Take 100 mg by mouth daily.     traMADol 50 MG tablet  Commonly known as:  ULTRAM  Take 1 tablet (50 mg total) by mouth every 6 (six) hours as needed.     valACYclovir 500 MG tablet  Commonly known  as:  VALTREX  Take 500 mg by mouth daily.     warfarin 5 MG tablet  Commonly known as:  COUMADIN  Take 10 mg by mouth daily.         Signed: Chiamaka Latka P 07/03/2012, 7:59 AM

## 2012-07-03 NOTE — Progress Notes (Signed)
Pt given D/C instructions with Rx's, verbal understanding given. Pt D/C'd home via wheelchair per MD order. Shynia Daleo, RN 

## 2012-07-03 NOTE — Progress Notes (Signed)
Patient ID: [name], male   DOB: 1950/07/09, 62 y.o.   MRN: [id number] Patient doing well pain is well controlled with just Tylenol is ambulating and voiding stable and be discharged

## 2012-07-05 ENCOUNTER — Encounter (HOSPITAL_COMMUNITY): Payer: Self-pay | Admitting: Neurosurgery

## 2012-07-05 LAB — WOUND CULTURE
Culture: NO GROWTH
Culture: NO GROWTH
Culture: NO GROWTH
Gram Stain: NONE SEEN

## 2012-07-06 LAB — POCT INR
INR: 1.5
INR: 1.5
INR: 1.5

## 2012-07-07 ENCOUNTER — Encounter: Payer: Self-pay | Admitting: Neurology

## 2012-07-07 ENCOUNTER — Other Ambulatory Visit: Payer: Self-pay | Admitting: Internal Medicine

## 2012-07-07 ENCOUNTER — Ambulatory Visit (INDEPENDENT_AMBULATORY_CARE_PROVIDER_SITE_OTHER): Payer: Medicare Other | Admitting: Neurology

## 2012-07-07 ENCOUNTER — Ambulatory Visit (INDEPENDENT_AMBULATORY_CARE_PROVIDER_SITE_OTHER): Payer: Medicare Other | Admitting: General Practice

## 2012-07-07 VITALS — BP 106/60 | HR 70 | Temp 97.4°F | Resp 16 | Ht 67.0 in | Wt 194.0 lb

## 2012-07-07 DIAGNOSIS — R413 Other amnesia: Secondary | ICD-10-CM

## 2012-07-07 DIAGNOSIS — R5381 Other malaise: Secondary | ICD-10-CM | POA: Diagnosis not present

## 2012-07-07 DIAGNOSIS — R2689 Other abnormalities of gait and mobility: Secondary | ICD-10-CM

## 2012-07-07 DIAGNOSIS — Z7901 Long term (current) use of anticoagulants: Secondary | ICD-10-CM

## 2012-07-07 DIAGNOSIS — R269 Unspecified abnormalities of gait and mobility: Secondary | ICD-10-CM | POA: Diagnosis not present

## 2012-07-07 DIAGNOSIS — R29818 Other symptoms and signs involving the nervous system: Secondary | ICD-10-CM

## 2012-07-07 DIAGNOSIS — R531 Weakness: Secondary | ICD-10-CM

## 2012-07-07 LAB — POCT INR: INR: 2.5

## 2012-07-07 LAB — ANAEROBIC CULTURE: Gram Stain: NONE SEEN

## 2012-07-07 NOTE — Progress Notes (Signed)
[name] is a 62 year old male with severe progressive rheumatoid arthritis and OA as well as HIV, TIA or CVA with left side weakness and 40-60% right carotid stenosis, s/p stent and then CABG, lumbar spine surgery, recurrent DVT now on coumadin.   He states he thinks he has been deterioirating in recent years.  He notes that he has swallowing isues with solid foods getting hung up and this was checked by ST and a swallow study at [facility] a couple of years ago and nothing much was found or recommended.  He feels this is worse in that is now every other day that it can happen.  After a while, the food moves on down the right pipe.  He also has memory complaints and he was tested 3-4 years ago at [facility] for this.  He continues to do a daily crossword, but it seems that he sometimes loses his train of concentration. His gait and balance have deteriorated, but he has had spine surgery and a screw had to be removed and he suffers from progressive kyphosis from arthritis.  Balance is also worse.  His last carotid US did not show any progression of stenosis, according th what the patient understood.  Review of symptoms positive for some constipation, diffiuclty walkingmedium or  long distances and memory loss.  Also drools when tired.  ROS otherwise negative. Past Medical History  Diagnosis Date  . DVT, lower extremity, recurrent 2008, 2009    LLE, chronic anticoag since 2009  . Osteoarthritis, knee     s/p B TKA  . HIV infection dx 1993  . Carotid artery occlusion     40-60% right ICA stenosis (09/2008)  . Hyperlipidemia   . TIA (transient ischemic attack) 1997    mild residual L mouth droop  . H/O hiatal hernia 2008    surgery  . Diabetes mellitus     no med since 08/2011  . CLL (chronic lymphoblastic leukemia) dx 2010    Followed at [facility] q64mo, no current therapy   . Gynecomastia, male   . Impotence of organic origin   . Rheumatoid arthritis dx 2010    MTX, follows with rheum  . Gout   .  Chronic back pain     follows with Nsurg  . Complication of anesthesia     nauseated  . PONV (postoperative nausea and vomiting)   . Coronary artery disease 2010    s/p CABG '10, sees Dr. Antoine Poche  . Hypertension     sees Dr. Rene Paci  . Seasonal allergies     hx of  . Bronchitis     hx of  . Myocardial infarction   . Neuromuscular disorder     diabetic neuropathy  . HIV positive     Current Outpatient Prescriptions on File Prior to Visit  Medication Sig Dispense Refill  . alendronate (FOSAMAX) 70 MG tablet Take 70 mg by mouth every 7 (seven) days. Take with a full glass of water on an empty stomach.      [name] aspirin EC 81 MG tablet Take 81 mg by mouth daily.      [name] atazanavir (REYATAZ) 300 MG capsule Take 300 mg by mouth daily with breakfast.      . atorvastatin (LIPITOR) 10 MG tablet Take 10 mg by mouth daily.      . B Complex-C (B-COMPLEX WITH VITAMIN C) tablet Take 1 tablet by mouth daily.      [name] CALCIUM PO Take 1,000 mg  by mouth daily.      . colchicine 0.6 MG tablet Take 0.6 mg by mouth 2 (two) times daily.      [name] enoxaparin (LOVENOX) 80 MG/0.8ML injection Inject 80 mg into the skin every 12 (twelve) hours.      . folic acid (FOLVITE) 1 MG tablet Take 1 mg by mouth daily.        [name] ipratropium (ATROVENT) 0.03 % nasal spray Place 2 sprays into the nose every 12 (twelve) hours as needed for rhinitis (congestion).      [name] lamiVUDine-zidovudine (COMBIVIR) 150-300 MG per tablet Take 1 tablet by mouth 2 (two) times daily.      . methotrexate (RHEUMATREX) 2.5 MG tablet Take 12.5 mg by mouth every Wednesday. Caution:Chemotherapy. Protect from light. Take 5 tablets once a week on Wednesdays      . metoprolol succinate (TOPROL-XL) 25 MG 24 hr tablet Take 25 mg by mouth daily.      . pregabalin (LYRICA) 200 MG capsule Take 400 mg by mouth 2 (two) times daily.      . raltegravir (ISENTRESS) 400 MG tablet Take 400 mg by mouth 2 (two) times daily.      . ritonavir (NORVIR) 100 MG  capsule Take 100 mg by mouth daily.      . traMADol (ULTRAM) 50 MG tablet Take 1 tablet (50 mg total) by mouth every 6 (six) hours as needed.  60 tablet  1  . valACYclovir (VALTREX) 500 MG tablet Take 500 mg by mouth daily.      [name] warfarin (COUMADIN) 5 MG tablet Take 10 mg by mouth daily.        No current facility-administered medications on file prior to visit.   Morphine; Other; Oxycodone-acetaminophen; Peanut-containing drug products; Penicillins; and Promethazine hcl  Allergies History   Social History  . Marital Status: Widowed    Spouse Name: N/A    Number of Children: N/A  . Years of Education: N/A   Occupational History  . Not on file.   Social History Main Topics  . Smoking status: Never Smoker   . Smokeless tobacco: Never Used     Comment: occ wine, single - married and divorced x 2; 3 kids  . Alcohol Use: Yes     Comment: occasional wine  . Drug Use: No  . Sexually Active: No     Comment: pt. given condoms   Other Topics Concern  . Not on file   Social History Narrative  . No narrative on file    Family History  Problem Relation Age of Onset  . Breast cancer Mother   . Prostate cancer Father   . Breast cancer Other   . Hypertension Other   . Hyperlipidemia Other   . Stroke Other   . Diabetes Other     BP 106/60  Pulse 70  Temp(Src) 97.4 F (36.3 C)  Resp 16  Ht 5\' 7"  (1.702 m)  Wt 194 lb (87.998 kg)  BMI 30.38 kg/m2   Alert and oriented x 3.  Memory function appears to be intact.  Concentration and attention are near normal for educational level and background.  Speech is fluent and withoutonly very mild word finding difficulty.  Is aware of current events.  Right carotid bruit detected.  Cranial nerve II through XII are within normal limits except some lower facial assymetry.  The righ Nasoloabial fold is actually less prominent than the left. Motor strength is 5 over 5 throughout all limbs.  Marked kyphosis present as well as ulnar deviation of  the MCP joints.  No atrophy, abnormal tone or tremors. Reflexes are Trace to 1+ and symmetric in the upper  Extremities and trace to absent in the lower extremities Sensory exam reveals a mild stocking pattern of decreased pinprick. Coordination is intact for fine movements and rapid alternating movements in all limbs, allowing for the arthritis. Gait and station are fair with a cane with the kyphosis noted.   Impression; Multiple medical issues including past TIA or CVA with no recent head scan noted.  He is on coumadin and his carotid stenosis is being monitored.  In general he feels he is going downhill in mobility and strength which may be primarily due to his proigressive arthritis.  Memory loss may be multifactorial.  Plan:  Continue meds MRI without to see if any new stroke like areas or atrophy or other to explain memory loss or weakness. RTC 4 months

## 2012-07-07 NOTE — Patient Instructions (Addendum)
INR 1.5 on 2/25.  Take 15 mg today and 15 mg tomorrow and re-check INR on Friday.  Continue Lovenox.

## 2012-07-07 NOTE — Patient Instructions (Addendum)
Your MRI is scheduled at [facility] on Wednesday, March 5th at 10:00 am.  Please check in at the first floor radiology department 15 minutes prior to your scheduled appointment time. Use Entrance A on [address] to enter the hospital.   [phone].  Follow up in our office in 4 months.

## 2012-07-08 DIAGNOSIS — H2510 Age-related nuclear cataract, unspecified eye: Secondary | ICD-10-CM | POA: Diagnosis not present

## 2012-07-08 DIAGNOSIS — E119 Type 2 diabetes mellitus without complications: Secondary | ICD-10-CM | POA: Diagnosis not present

## 2012-07-08 DIAGNOSIS — H538 Other visual disturbances: Secondary | ICD-10-CM | POA: Diagnosis not present

## 2012-07-08 DIAGNOSIS — H40009 Preglaucoma, unspecified, unspecified eye: Secondary | ICD-10-CM | POA: Diagnosis not present

## 2012-07-09 ENCOUNTER — Ambulatory Visit: Payer: Medicare Other | Admitting: Cardiology

## 2012-07-13 ENCOUNTER — Ambulatory Visit (INDEPENDENT_AMBULATORY_CARE_PROVIDER_SITE_OTHER): Payer: Medicare Other | Admitting: General Practice

## 2012-07-13 DIAGNOSIS — I749 Embolism and thrombosis of unspecified artery: Secondary | ICD-10-CM | POA: Diagnosis not present

## 2012-07-13 DIAGNOSIS — Z7901 Long term (current) use of anticoagulants: Secondary | ICD-10-CM | POA: Diagnosis not present

## 2012-07-13 LAB — POCT INR: INR: 1.8

## 2012-07-14 ENCOUNTER — Ambulatory Visit (HOSPITAL_COMMUNITY): Payer: Medicare Other

## 2012-07-14 ENCOUNTER — Ambulatory Visit (HOSPITAL_COMMUNITY)
Admission: RE | Admit: 2012-07-14 | Discharge: 2012-07-14 | Disposition: A | Discharge status: Home / Self Care | Payer: Medicare Other | Source: Ambulatory Visit | Attending: Neurology | Admitting: Neurology

## 2012-07-14 DIAGNOSIS — R2689 Other abnormalities of gait and mobility: Secondary | ICD-10-CM

## 2012-07-14 DIAGNOSIS — R413 Other amnesia: Secondary | ICD-10-CM | POA: Insufficient documentation

## 2012-07-14 DIAGNOSIS — M810 Age-related osteoporosis without current pathological fracture: Secondary | ICD-10-CM | POA: Diagnosis not present

## 2012-07-14 DIAGNOSIS — E785 Hyperlipidemia, unspecified: Secondary | ICD-10-CM | POA: Diagnosis not present

## 2012-07-14 DIAGNOSIS — R279 Unspecified lack of coordination: Secondary | ICD-10-CM | POA: Diagnosis not present

## 2012-07-14 DIAGNOSIS — G319 Degenerative disease of nervous system, unspecified: Secondary | ICD-10-CM | POA: Diagnosis not present

## 2012-07-14 DIAGNOSIS — E119 Type 2 diabetes mellitus without complications: Secondary | ICD-10-CM | POA: Diagnosis not present

## 2012-07-14 DIAGNOSIS — R269 Unspecified abnormalities of gait and mobility: Secondary | ICD-10-CM | POA: Diagnosis not present

## 2012-07-14 DIAGNOSIS — M069 Rheumatoid arthritis, unspecified: Secondary | ICD-10-CM | POA: Diagnosis not present

## 2012-07-14 DIAGNOSIS — R5381 Other malaise: Secondary | ICD-10-CM | POA: Diagnosis not present

## 2012-07-14 DIAGNOSIS — R531 Weakness: Secondary | ICD-10-CM

## 2012-07-19 ENCOUNTER — Other Ambulatory Visit: Payer: Self-pay | Admitting: Cardiology

## 2012-07-19 DIAGNOSIS — H40009 Preglaucoma, unspecified, unspecified eye: Secondary | ICD-10-CM | POA: Diagnosis not present

## 2012-07-19 MED ORDER — METOPROLOL SUCCINATE ER 25 MG PO TB24: 25.0000 mg | ORAL_TABLET | Freq: Every day | ORAL | Status: DC

## 2012-07-19 MED ORDER — ATORVASTATIN CALCIUM 10 MG PO TABS: 10.0000 mg | ORAL_TABLET | Freq: Every day | ORAL | Status: DC

## 2012-07-22 ENCOUNTER — Ambulatory Visit (INDEPENDENT_AMBULATORY_CARE_PROVIDER_SITE_OTHER): Payer: Medicare Other | Admitting: General Practice

## 2012-07-22 DIAGNOSIS — M459 Ankylosing spondylitis of unspecified sites in spine: Secondary | ICD-10-CM | POA: Diagnosis not present

## 2012-07-22 DIAGNOSIS — Z7901 Long term (current) use of anticoagulants: Secondary | ICD-10-CM

## 2012-07-22 LAB — POCT INR: INR: 1.9

## 2012-07-23 ENCOUNTER — Telehealth: Payer: Self-pay | Admitting: Neurology

## 2012-07-23 NOTE — Telephone Encounter (Signed)
The patient called to request MRI results.  The MRI was ordered by Dr. Smiley Houseman and completed about 10 days ago.  The patient may be reached at [phone].

## 2012-07-23 NOTE — Telephone Encounter (Signed)
MRI shows aging changes and mild atrophy.  Nothing specific.  Similar to prior results

## 2012-07-23 NOTE — Telephone Encounter (Signed)
**  Dr. Smiley Houseman, please advise. Results in EPIC. Thank you.

## 2012-07-26 ENCOUNTER — Telehealth: Payer: Self-pay | Admitting: Neurology

## 2012-07-26 NOTE — Telephone Encounter (Signed)
The patient called to get results of his MRI that was done about 2 weeks ago.  Please call the patient at [phone].

## 2012-07-26 NOTE — Telephone Encounter (Signed)
Called and spoke with the patient. Informed that his MRI was basically normal for his age and that there were no significant changes or acute findings upon comparison with previous imaging. The patient thanked me for calling and then proceeded to tell me that he was not happy with his office visit with the doctor and that he felt like he "blew me off and did a terrible exam." I asked what I could do to help and the patient stated nothing but that he was going back to his PCP, Dr. Felicity Coyer and see what she suggests he do. I apologized and told him to call me if I could do anything to help him. He states he will.

## 2012-08-03 ENCOUNTER — Other Ambulatory Visit: Payer: Self-pay | Admitting: Internal Medicine

## 2012-08-03 ENCOUNTER — Other Ambulatory Visit: Payer: Medicare Other

## 2012-08-03 ENCOUNTER — Other Ambulatory Visit (HOSPITAL_COMMUNITY)
Admission: RE | Admit: 2012-08-03 | Discharge: 2012-08-03 | Disposition: A | Discharge status: Home / Self Care | Payer: Medicare Other | Source: Ambulatory Visit | Attending: Internal Medicine | Admitting: Internal Medicine

## 2012-08-03 ENCOUNTER — Other Ambulatory Visit: Payer: Self-pay | Admitting: *Deleted

## 2012-08-03 DIAGNOSIS — Z113 Encounter for screening for infections with a predominantly sexual mode of transmission: Secondary | ICD-10-CM | POA: Diagnosis not present

## 2012-08-03 DIAGNOSIS — M069 Rheumatoid arthritis, unspecified: Secondary | ICD-10-CM | POA: Diagnosis not present

## 2012-08-03 DIAGNOSIS — B20 Human immunodeficiency virus [HIV] disease: Secondary | ICD-10-CM

## 2012-08-03 DIAGNOSIS — Z79899 Other long term (current) drug therapy: Secondary | ICD-10-CM

## 2012-08-03 LAB — CBC WITH DIFFERENTIAL/PLATELET
Basophils Absolute: 0 10*3/uL (ref 0.0–0.1)
Basophils Relative: 0 % (ref 0–1)
Eosinophils Absolute: 0.1 10*3/uL (ref 0.0–0.7)
Eosinophils Relative: 2 % (ref 0–5)
HCT: 36.8 % — ABNORMAL LOW (ref 39.0–52.0)
Hemoglobin: 12.9 g/dL — ABNORMAL LOW (ref 13.0–17.0)
Lymphocytes Relative: 66 % — ABNORMAL HIGH (ref 12–46)
Lymphs Abs: 4.4 10*3/uL — ABNORMAL HIGH (ref 0.7–4.0)
MCH: 39 pg — ABNORMAL HIGH (ref 26.0–34.0)
MCHC: 35.1 g/dL (ref 30.0–36.0)
MCV: 111.2 fL — ABNORMAL HIGH (ref 78.0–100.0)
Monocytes Absolute: 0.6 10*3/uL (ref 0.1–1.0)
Monocytes Relative: 8 % (ref 3–12)
Neutro Abs: 1.6 10*3/uL — ABNORMAL LOW (ref 1.7–7.7)
Neutrophils Relative %: 24 % — ABNORMAL LOW (ref 43–77)
Platelets: 157 10*3/uL (ref 150–400)
RBC: 3.31 MIL/uL — ABNORMAL LOW (ref 4.22–5.81)
RDW: 16.2 % — ABNORMAL HIGH (ref 11.5–15.5)
WBC: 6.6 10*3/uL (ref 4.0–10.5)

## 2012-08-03 LAB — COMPLETE METABOLIC PANEL WITH GFR
ALT: 28 U/L (ref 0–53)
AST: 33 U/L (ref 0–37)
Albumin: 4.1 g/dL (ref 3.5–5.2)
Alkaline Phosphatase: 89 U/L (ref 39–117)
BUN: 21 mg/dL (ref 6–23)
CO2: 28 mEq/L (ref 19–32)
Calcium: 8.8 mg/dL (ref 8.4–10.5)
Chloride: 104 mEq/L (ref 96–112)
Creat: 0.88 mg/dL (ref 0.50–1.35)
GFR, Est African American: >89 mL/min
GFR, Est Non African American: >89 mL/min
Glucose, Bld: 68 mg/dL — ABNORMAL LOW (ref 70–99)
Potassium: 4.2 mEq/L (ref 3.5–5.3)
Sodium: 140 mEq/L (ref 135–145)
Total Bilirubin: 3.5 mg/dL — ABNORMAL HIGH (ref 0.3–1.2)
Total Protein: 5.6 g/dL — ABNORMAL LOW (ref 6.0–8.3)

## 2012-08-03 LAB — LIPID PANEL
Cholesterol: 120 mg/dL (ref 0–200)
HDL: 31 mg/dL — ABNORMAL LOW (ref >39)
LDL Cholesterol: 68 mg/dL (ref 0–99)
Total CHOL/HDL Ratio: 3.9 Ratio
Triglycerides: 106 mg/dL (ref <150)
VLDL: 21 mg/dL (ref 0–40)

## 2012-08-04 LAB — T-HELPER CELL (CD4) - (RCID CLINIC ONLY)
CD4 % Helper T Cell: 13 % — ABNORMAL LOW (ref 33–55)
CD4 T Cell Abs: 560 uL (ref 400–2700)

## 2012-08-04 LAB — HIV-1 RNA QUANT-NO REFLEX-BLD
HIV 1 RNA Quant: <20 copies/mL (ref <20)
HIV-1 RNA Quant, Log: <1.3 {Log} (ref <1.30)

## 2012-08-04 LAB — RPR

## 2012-08-05 DIAGNOSIS — M545 Low back pain, unspecified: Secondary | ICD-10-CM | POA: Diagnosis not present

## 2012-08-05 DIAGNOSIS — M5137 Other intervertebral disc degeneration, lumbosacral region: Secondary | ICD-10-CM | POA: Diagnosis not present

## 2012-08-05 DIAGNOSIS — B351 Tinea unguium: Secondary | ICD-10-CM | POA: Diagnosis not present

## 2012-08-05 DIAGNOSIS — I70209 Unspecified atherosclerosis of native arteries of extremities, unspecified extremity: Secondary | ICD-10-CM | POA: Diagnosis not present

## 2012-08-05 DIAGNOSIS — M546 Pain in thoracic spine: Secondary | ICD-10-CM | POA: Diagnosis not present

## 2012-08-05 DIAGNOSIS — L851 Acquired keratosis [keratoderma] palmaris et plantaris: Secondary | ICD-10-CM | POA: Diagnosis not present

## 2012-08-05 DIAGNOSIS — Z006 Encounter for examination for normal comparison and control in clinical research program: Secondary | ICD-10-CM | POA: Diagnosis not present

## 2012-08-05 DIAGNOSIS — M48061 Spinal stenosis, lumbar region without neurogenic claudication: Secondary | ICD-10-CM | POA: Diagnosis not present

## 2012-08-05 DIAGNOSIS — IMO0002 Reserved for concepts with insufficient information to code with codable children: Secondary | ICD-10-CM | POA: Diagnosis not present

## 2012-08-11 ENCOUNTER — Ambulatory Visit (INDEPENDENT_AMBULATORY_CARE_PROVIDER_SITE_OTHER): Payer: Medicare Other | Admitting: General Practice

## 2012-08-11 DIAGNOSIS — Z7901 Long term (current) use of anticoagulants: Secondary | ICD-10-CM

## 2012-08-11 LAB — POCT INR: INR: 2.3

## 2012-08-15 LAB — AFB CULTURE WITH SMEAR (NOT AT ARMC)
Acid Fast Smear: NONE SEEN
Acid Fast Smear: NONE SEEN
Acid Fast Smear: NONE SEEN

## 2012-08-18 ENCOUNTER — Encounter: Payer: Self-pay | Admitting: Internal Medicine

## 2012-08-18 ENCOUNTER — Ambulatory Visit (INDEPENDENT_AMBULATORY_CARE_PROVIDER_SITE_OTHER): Payer: Medicare Other | Admitting: Internal Medicine

## 2012-08-18 VITALS — BP 121/73 | HR 75 | Temp 97.8°F | Ht 67.0 in | Wt 193.0 lb

## 2012-08-18 DIAGNOSIS — B20 Human immunodeficiency virus [HIV] disease: Secondary | ICD-10-CM

## 2012-08-19 DIAGNOSIS — M069 Rheumatoid arthritis, unspecified: Secondary | ICD-10-CM | POA: Diagnosis not present

## 2012-08-21 NOTE — Progress Notes (Signed)
  Subjective:    Patient ID: [name], male    DOB: 10-12-1950, 62 y.o.   MRN: [id number]  HPI folllow up of his HIV.  He remains on a salvage regimen with Isentress, Combivir, ATV/r and continues to have excellent compliance and tolerance.  He recently has started on remicaide for rheumatoid arthritis and has noted a big difference in his joints and feels much better.  No missed doses.  No weight loss, no new issues.  Cholestrol good, viral load undetectable and CD4 stable.    Review of Systems  Constitutional: Negative for fever, chills and fatigue.  HENT: Negative for sore throat and trouble swallowing.   Respiratory: Negative for cough and shortness of breath.   Cardiovascular: Negative for chest pain and leg swelling.  Gastrointestinal: Negative for nausea, abdominal pain and diarrhea.  Musculoskeletal:       Much improved arthralgias and less joint swelling  Skin: Negative for rash.  Neurological: Negative for dizziness and headaches.       Objective:   Physical Exam  Constitutional: He appears well-developed and well-nourished. No distress.  HENT:  Mouth/Throat: Oropharynx is clear and moist. No oropharyngeal exudate.  Cardiovascular: Normal rate, regular rhythm and normal heart sounds.  Exam reveals no gallop and no friction rub.   No murmur heard. Pulmonary/Chest: Effort normal and breath sounds normal. No respiratory distress. He has no wheezes. He has no rales.  Abdominal: Soft. Bowel sounds are normal. He exhibits no distension. There is no tenderness. There is no rebound.  Musculoskeletal:  Bilateral hand deformities c/w RA  Lymphadenopathy:    He has no cervical adenopathy.  Skin: No rash noted.          Assessment & Plan:

## 2012-08-21 NOTE — Assessment & Plan Note (Signed)
Excellent compliance and now completely undetectable.  RTC in 3 months now that he is on remicaide to keep a closer eye on his CD4 and viral load.

## 2012-08-23 ENCOUNTER — Ambulatory Visit (INDEPENDENT_AMBULATORY_CARE_PROVIDER_SITE_OTHER): Payer: Medicare Other | Admitting: General Practice

## 2012-08-23 DIAGNOSIS — Z7901 Long term (current) use of anticoagulants: Secondary | ICD-10-CM

## 2012-08-23 LAB — POCT INR: INR: 2.7

## 2012-08-30 DIAGNOSIS — Z7901 Long term (current) use of anticoagulants: Secondary | ICD-10-CM | POA: Diagnosis not present

## 2012-08-30 DIAGNOSIS — I749 Embolism and thrombosis of unspecified artery: Secondary | ICD-10-CM | POA: Diagnosis not present

## 2012-09-06 ENCOUNTER — Other Ambulatory Visit: Payer: Self-pay | Admitting: Internal Medicine

## 2012-09-06 DIAGNOSIS — H40009 Preglaucoma, unspecified, unspecified eye: Secondary | ICD-10-CM | POA: Diagnosis not present

## 2012-09-13 DIAGNOSIS — M2550 Pain in unspecified joint: Secondary | ICD-10-CM | POA: Diagnosis not present

## 2012-09-13 DIAGNOSIS — M810 Age-related osteoporosis without current pathological fracture: Secondary | ICD-10-CM | POA: Diagnosis not present

## 2012-09-13 DIAGNOSIS — M069 Rheumatoid arthritis, unspecified: Secondary | ICD-10-CM | POA: Diagnosis not present

## 2012-09-13 DIAGNOSIS — C9110 Chronic lymphocytic leukemia of B-cell type not having achieved remission: Secondary | ICD-10-CM | POA: Diagnosis not present

## 2012-09-14 ENCOUNTER — Telehealth: Payer: Self-pay | Admitting: Oncology

## 2012-09-14 NOTE — Telephone Encounter (Signed)
Pt will come in on Thursday to signed a ROI to obtain records from [place].

## 2012-09-17 DIAGNOSIS — I82409 Acute embolism and thrombosis of unspecified deep veins of unspecified lower extremity: Secondary | ICD-10-CM | POA: Diagnosis not present

## 2012-09-17 DIAGNOSIS — Z7901 Long term (current) use of anticoagulants: Secondary | ICD-10-CM | POA: Diagnosis not present

## 2012-09-17 DIAGNOSIS — I749 Embolism and thrombosis of unspecified artery: Secondary | ICD-10-CM | POA: Diagnosis not present

## 2012-09-20 ENCOUNTER — Ambulatory Visit (INDEPENDENT_AMBULATORY_CARE_PROVIDER_SITE_OTHER): Payer: Self-pay | Admitting: General Practice

## 2012-09-20 DIAGNOSIS — Z7901 Long term (current) use of anticoagulants: Secondary | ICD-10-CM

## 2012-09-20 DIAGNOSIS — M810 Age-related osteoporosis without current pathological fracture: Secondary | ICD-10-CM | POA: Diagnosis not present

## 2012-09-20 DIAGNOSIS — M069 Rheumatoid arthritis, unspecified: Secondary | ICD-10-CM | POA: Diagnosis not present

## 2012-09-20 DIAGNOSIS — C9110 Chronic lymphocytic leukemia of B-cell type not having achieved remission: Secondary | ICD-10-CM | POA: Diagnosis not present

## 2012-09-20 DIAGNOSIS — M2550 Pain in unspecified joint: Secondary | ICD-10-CM | POA: Diagnosis not present

## 2012-09-20 LAB — POCT INR: INR: 3

## 2012-09-21 LAB — PROTIME-INR: INR: 3 — AB (ref 0.9–1.1)

## 2012-09-22 ENCOUNTER — Ambulatory Visit (INDEPENDENT_AMBULATORY_CARE_PROVIDER_SITE_OTHER): Payer: Medicare Other | Admitting: Internal Medicine

## 2012-09-22 ENCOUNTER — Encounter: Payer: Self-pay | Admitting: Internal Medicine

## 2012-09-22 VITALS — BP 120/62 | HR 72 | Temp 97.9°F | Wt 186.4 lb

## 2012-09-22 DIAGNOSIS — M6281 Muscle weakness (generalized): Secondary | ICD-10-CM

## 2012-09-22 DIAGNOSIS — R1310 Dysphagia, unspecified: Secondary | ICD-10-CM | POA: Diagnosis not present

## 2012-09-22 DIAGNOSIS — R531 Weakness: Secondary | ICD-10-CM

## 2012-09-22 DIAGNOSIS — M069 Rheumatoid arthritis, unspecified: Secondary | ICD-10-CM

## 2012-09-22 NOTE — Progress Notes (Signed)
Subjective:    Patient ID: [name], male    DOB: December 17, 1950, 62 y.o.   MRN: [id number]  HPI  Here for follow up -reviewed interval issues and chronic medical issues:  Past Medical History  Diagnosis Date  . DVT, lower extremity, recurrent 2008, 2009    LLE, chronic anticoag since 2009  . Osteoarthritis, knee     s/p B TKA  . HIV infection dx 1993  . Carotid artery occlusion     40-60% right ICA stenosis (09/2008)  . Hyperlipidemia   . TIA (transient ischemic attack) 1997    mild residual L mouth droop  . H/O hiatal hernia 2008    surgery  . Diabetes mellitus     no med since 08/2011  . CLL (chronic lymphoblastic leukemia) dx 2010    Followed at [facility] q38mo, no current therapy   . Gynecomastia, male   . Impotence of organic origin   . Rheumatoid arthritis dx 2010    MTX, follows with rheum  . Gout   . Chronic back pain     follows with Nsurg  . Complication of anesthesia     nauseated  . PONV (postoperative nausea and vomiting)   . Coronary artery disease 2010    s/p CABG '10, sees Dr. Antoine Poche  . Hypertension     sees Dr. Rene Paci  . Seasonal allergies     hx of  . Bronchitis     hx of  . Myocardial infarction   . Neuromuscular disorder     diabetic neuropathy  . HIV positive     Review of Systems  Constitutional: Positive for activity change and fatigue. Negative for fever, chills, appetite change and unexpected weight change.  Eyes: Negative for visual disturbance.  Respiratory: Positive for choking (trouble swallowing > 2 years). Negative for cough, shortness of breath and wheezing.   Cardiovascular: Negative for chest pain, palpitations and leg swelling.  Gastrointestinal: Negative for nausea, abdominal pain, diarrhea, constipation and abdominal distention.  Musculoskeletal: Positive for myalgias, back pain, joint swelling, arthralgias and gait problem.  Skin: Negative for pallor and wound.  Allergic/Immunologic: Positive for  immunocompromised state.  Neurological: Positive for tremors (R hand, with ADLs) and weakness (generalized - BLE and hands). Negative for dizziness, seizures, facial asymmetry, speech difficulty, numbness and headaches.  Psychiatric/Behavioral: Positive for decreased concentration. Negative for behavioral problems, confusion and self-injury. The patient is not nervous/anxious.        Objective:   Physical Exam  BP 120/62  Pulse 72  Temp(Src) 97.9 F (36.6 C) (Oral)  Wt 186 lb 6.4 oz (84.55 kg)  BMI 29.19 kg/m2  SpO2 97% Wt Readings from Last 3 Encounters:  09/22/12 186 lb 6.4 oz (84.55 kg)  08/18/12 193 lb (87.544 kg)  07/07/12 194 lb (87.998 kg)   Constitutional:  He is frail, but appears well-developed and well-nourished. No distress.  Eyes: wears corrective lenses - PERRL, EOMI Neck: Normal range of motion. Neck supple. No JVD or LAD present. No thyromegaly present.  Cardiovascular: Normal rate, regular rhythm and normal heart sounds.  No murmur heard. no BLE edema Pulmonary/Chest: Effort normal and breath sounds normal. No respiratory distress. no wheezes. Musculoskeletal: spine deformity with prominence of lumbar rod at lower T level and "bony tumor" mid T spine. tender to direct palpation - gait is forward bent (90 degrees+) at hips, aided with cane. B knees s/p TKR, no effusion - B hands with MCP radial deviation. no synovitis or effusions.  Neurological: he is alert and oriented to person, place, and time. No cranial nerve deficit. Coordination, speech and recall normal. Poor B hand grip, no detectable tremor at this time Skin: very tan - Skin is warm and dry.  No erythema or ulceration.  Psychiatric: he has a normal mood and affect. behavior is normal. Judgment and thought content normal.   Lab Results  Component Value Date   WBC 6.6 11/18/2011   HGB 12.3* 11/18/2011   HCT 35.4* 11/18/2011   PLT 172 11/18/2011   GLUCOSE 76 11/18/2011   CHOL 171 03/20/2011   TRIG 183* 03/20/2011    HDL 40 03/20/2011   LDLCALC 94 03/20/2011   ALT 23 11/18/2011   AST 28 11/18/2011   NA 145 11/18/2011   K 4.0 11/18/2011   CL 109 11/18/2011   CREATININE 0.87 11/18/2011   BUN 19 11/18/2011   CO2 30 11/18/2011   TSH 0.658 * 09/15/2008   INR 1.4 06/22/2012   HGBA1C  Value: 4.7 . 10/04/2008       Assessment & Plan:    See problem list. Medications and labs reviewed today.  Time spent with pt today 25 minutes, greater than 50% time spent counseling patient on progressive motor weakness issues, RA/gout and spine, HIV, CAD and dysphagia- also extensive medication review and interval events/consults.

## 2012-09-22 NOTE — Patient Instructions (Signed)
It was good to see you today. We have reviewed your prior records including labs and tests today Medications reviewed and updated, no changes recommended at this time. we'll make referral to [facility] for evaluation of your difficulty swallowing and possible repeat esophageal dilation. Our office will contact you regarding appointment(s) once made. Continue working with your other specialists as reviewed - we'll plan referral to new neurologist fall 2014 followup in 3-6 months, call sooner if needed

## 2012-09-22 NOTE — Assessment & Plan Note (Signed)
Multifactorial co-morbid illness but ?other linking dx for multiple symptoms Increase balance issues - falling forward, R hand tremor and B hand/grip weakness, drooling and trouble swallowing, poor memroy recall (?dog's name, task list done) Will refer to neuro for fall 2014 as prior eval by Dr Smiley Houseman and [facility] inconclusive -   Reports c-spine MRI done with Dr Clarita Crane in late 2013- "normal" per pt report MRI brain 07/2012 with diffuse global atrophy, no acute change or specific diagnosis made

## 2012-09-22 NOTE — Assessment & Plan Note (Signed)
Increasing symptoms, especially a.m. And with solid foods History of esophageal stricture, last dilation at [facility] in 2011 Refer to GI for reevaluation at this time, consider stricture dilation as needed

## 2012-09-22 NOTE — Assessment & Plan Note (Signed)
Dx 2010 - previously followed with rheum in [place] but transfered to Dr Dareen Piano spring 2014 Prev on MTX, then trial Remicade late 07/2012-08/2012 - stopped because of severe myalgias, arthralgias and increase in AST/ALT Now low-dose prednisone pending normalization of labs and followup with rheumatology Complicated by gout and osteoporosis - med mgmt reviewed The current medical regimen is effective;  continue present plan and medications.

## 2012-09-27 DIAGNOSIS — M810 Age-related osteoporosis without current pathological fracture: Secondary | ICD-10-CM | POA: Diagnosis not present

## 2012-09-27 DIAGNOSIS — R7989 Other specified abnormal findings of blood chemistry: Secondary | ICD-10-CM | POA: Diagnosis not present

## 2012-09-27 DIAGNOSIS — M2550 Pain in unspecified joint: Secondary | ICD-10-CM | POA: Diagnosis not present

## 2012-09-27 DIAGNOSIS — C9110 Chronic lymphocytic leukemia of B-cell type not having achieved remission: Secondary | ICD-10-CM | POA: Diagnosis not present

## 2012-09-27 DIAGNOSIS — M069 Rheumatoid arthritis, unspecified: Secondary | ICD-10-CM | POA: Diagnosis not present

## 2012-10-02 DIAGNOSIS — J40 Bronchitis, not specified as acute or chronic: Secondary | ICD-10-CM | POA: Diagnosis not present

## 2012-10-02 DIAGNOSIS — R05 Cough: Secondary | ICD-10-CM | POA: Diagnosis not present

## 2012-10-02 DIAGNOSIS — Z7901 Long term (current) use of anticoagulants: Secondary | ICD-10-CM | POA: Diagnosis not present

## 2012-10-02 DIAGNOSIS — R059 Cough, unspecified: Secondary | ICD-10-CM | POA: Diagnosis not present

## 2012-10-02 DIAGNOSIS — Z79899 Other long term (current) drug therapy: Secondary | ICD-10-CM | POA: Diagnosis not present

## 2012-10-02 DIAGNOSIS — J209 Acute bronchitis, unspecified: Secondary | ICD-10-CM | POA: Diagnosis not present

## 2012-10-02 DIAGNOSIS — I509 Heart failure, unspecified: Secondary | ICD-10-CM | POA: Diagnosis not present

## 2012-10-02 DIAGNOSIS — Z7982 Long term (current) use of aspirin: Secondary | ICD-10-CM | POA: Diagnosis not present

## 2012-10-02 DIAGNOSIS — Z21 Asymptomatic human immunodeficiency virus [HIV] infection status: Secondary | ICD-10-CM | POA: Diagnosis not present

## 2012-10-05 ENCOUNTER — Ambulatory Visit (INDEPENDENT_AMBULATORY_CARE_PROVIDER_SITE_OTHER): Payer: Medicare Other | Admitting: Internal Medicine

## 2012-10-05 ENCOUNTER — Encounter: Payer: Self-pay | Admitting: Internal Medicine

## 2012-10-05 ENCOUNTER — Ambulatory Visit (INDEPENDENT_AMBULATORY_CARE_PROVIDER_SITE_OTHER)
Admission: RE | Admit: 2012-10-05 | Discharge: 2012-10-05 | Disposition: A | Discharge status: Home / Self Care | Payer: Medicare Other | Source: Ambulatory Visit | Attending: Internal Medicine | Admitting: Internal Medicine

## 2012-10-05 ENCOUNTER — Ambulatory Visit (INDEPENDENT_AMBULATORY_CARE_PROVIDER_SITE_OTHER): Payer: Medicare Other | Admitting: General Practice

## 2012-10-05 VITALS — BP 110/58 | HR 79 | Temp 98.3°F | Ht 67.0 in | Wt 187.0 lb

## 2012-10-05 DIAGNOSIS — Z7901 Long term (current) use of anticoagulants: Secondary | ICD-10-CM

## 2012-10-05 DIAGNOSIS — Z888 Allergy status to other drugs, medicaments and biological substances status: Secondary | ICD-10-CM | POA: Diagnosis not present

## 2012-10-05 DIAGNOSIS — R05 Cough: Secondary | ICD-10-CM

## 2012-10-05 DIAGNOSIS — R079 Chest pain, unspecified: Secondary | ICD-10-CM | POA: Diagnosis not present

## 2012-10-05 DIAGNOSIS — R059 Cough, unspecified: Secondary | ICD-10-CM

## 2012-10-05 DIAGNOSIS — J209 Acute bronchitis, unspecified: Secondary | ICD-10-CM | POA: Diagnosis not present

## 2012-10-05 DIAGNOSIS — T7840XA Allergy, unspecified, initial encounter: Secondary | ICD-10-CM

## 2012-10-05 LAB — POCT INR: INR: 1.6

## 2012-10-05 MED ORDER — DOXYCYCLINE HYCLATE 100 MG PO TABS: 100.0000 mg | ORAL_TABLET | Freq: Two times a day (BID) | ORAL | Status: DC

## 2012-10-05 MED ORDER — PREDNISONE 10 MG PO TABS: ORAL_TABLET | ORAL | Status: DC

## 2012-10-05 NOTE — Patient Instructions (Signed)
Acute Bronchitis You have acute bronchitis. This means you have a chest cold. The airways in your lungs are red and sore (inflamed). Acute means it is sudden onset.  CAUSES Bronchitis is most often caused by the same virus that causes a cold. SYMPTOMS   Body aches.  Chest congestion.  Chills.  Cough.  Fever.  Shortness of breath.  Sore throat. TREATMENT  Acute bronchitis is usually treated with rest, fluids, and medicines for relief of fever or cough. Most symptoms should go away after a few days or a week. Increased fluids may help thin your secretions and will prevent dehydration. Your caregiver may give you an inhaler to improve your symptoms. The inhaler reduces shortness of breath and helps control cough. You can take over-the-counter pain relievers or cough medicine to decrease coughing, pain, or fever. A cool-air vaporizer may help thin bronchial secretions and make it easier to clear your chest. Antibiotics are usually not needed but can be prescribed if you smoke, are seriously ill, have chronic lung problems, are elderly, or you are at higher risk for developing complications.Allergies and asthma can make bronchitis worse. Repeated episodes of bronchitis may cause longstanding lung problems. Avoid smoking and secondhand smoke.Exposure to cigarette smoke or irritating chemicals will make bronchitis worse. If you are a cigarette smoker, consider using nicotine gum or skin patches to help control withdrawal symptoms. Quitting smoking will help your lungs heal faster. Recovery from bronchitis is often slow, but you should start feeling better after 2 to 3 days. Cough from bronchitis frequently lasts for 3 to 4 weeks. To prevent another bout of acute bronchitis:  Quit smoking.  Wash your hands frequently to get rid of viruses or use a hand sanitizer.  Avoid other people with cold or virus symptoms.  Try not to touch your hands to your mouth, nose, or eyes. SEEK IMMEDIATE  MEDICAL CARE IF:  You develop increased fever, chills, or chest pain.  You have severe shortness of breath or bloody sputum.  You develop dehydration, fainting, repeated vomiting, or a severe headache.  You have no improvement after 1 week of treatment or you get worse. MAKE SURE YOU:   Understand these instructions.  Will watch your condition.  Will get help right away if you are not doing well or get worse. Document Released: 06/05/2004 Document Revised: 07/21/2011 Document Reviewed: 08/21/2010 [facility] Patient Information 2014 [place], [place]. Acute Bronchitis You have acute bronchitis. This means you have a chest cold. The airways in your lungs are red and sore (inflamed). Acute means it is sudden onset.  CAUSES Bronchitis is most often caused by the same virus that causes a cold. SYMPTOMS   Body aches.  Chest congestion.  Chills.  Cough.  Fever.  Shortness of breath.  Sore throat. TREATMENT  Acute bronchitis is usually treated with rest, fluids, and medicines for relief of fever or cough. Most symptoms should go away after a few days or a week. Increased fluids may help thin your secretions and will prevent dehydration. Your caregiver may give you an inhaler to improve your symptoms. The inhaler reduces shortness of breath and helps control cough. You can take over-the-counter pain relievers or cough medicine to decrease coughing, pain, or fever. A cool-air vaporizer may help thin bronchial secretions and make it easier to clear your chest. Antibiotics are usually not needed but can be prescribed if you smoke, are seriously ill, have chronic lung problems, are elderly, or you are at higher risk for developing complications.Allergies and  asthma can make bronchitis worse. Repeated episodes of bronchitis may cause longstanding lung problems. Avoid smoking and secondhand smoke.Exposure to cigarette smoke or irritating chemicals will make bronchitis worse. If you are a  cigarette smoker, consider using nicotine gum or skin patches to help control withdrawal symptoms. Quitting smoking will help your lungs heal faster. Recovery from bronchitis is often slow, but you should start feeling better after 2 to 3 days. Cough from bronchitis frequently lasts for 3 to 4 weeks. To prevent another bout of acute bronchitis:  Quit smoking.  Wash your hands frequently to get rid of viruses or use a hand sanitizer.  Avoid other people with cold or virus symptoms.  Try not to touch your hands to your mouth, nose, or eyes. SEEK IMMEDIATE MEDICAL CARE IF:  You develop increased fever, chills, or chest pain.  You have severe shortness of breath or bloody sputum.  You develop dehydration, fainting, repeated vomiting, or a severe headache.  You have no improvement after 1 week of treatment or you get worse. MAKE SURE YOU:   Understand these instructions.  Will watch your condition.  Will get help right away if you are not doing well or get worse. Document Released: 06/05/2004 Document Revised: 07/21/2011 Document Reviewed: 08/21/2010 [facility] Patient Information 2014 [place], [place].

## 2012-10-05 NOTE — Progress Notes (Signed)
Subjective:    Patient ID: [name], male    DOB: 01-18-1951, 62 y.o.   MRN: [id number]  HPI  Pt presents to the clinic today with c/o a rash. This started after yesterday after he had taken 2 doses of levaquin for treatment of acute bronchitis. He does not feel like the antibiotic is helping at all. A chest xray was not performed. He has not history of asthma or allergies. He has not put anything on the rash. He is still coughing up green sputum. He has been running low grade fevers. He has had sick contacts  Review of Systems  Past Medical History  Diagnosis Date  . DVT, lower extremity, recurrent 2008, 2009    LLE, chronic anticoag since 2009  . Osteoarthritis, knee     s/p B TKA  . HIV infection dx 1993  . Carotid artery occlusion     40-60% right ICA stenosis (09/2008)  . Hyperlipidemia   . TIA (transient ischemic attack) 1997    mild residual L mouth droop  . H/O hiatal hernia 2008    surgery  . Diabetes mellitus     no med since 08/2011  . CLL (chronic lymphoblastic leukemia) dx 2010    Followed at [facility] q1mo, no current therapy   . Gynecomastia, male   . Impotence of organic origin   . Rheumatoid arthritis dx 2010    MTX, follows with rheum  . Gout   . Chronic back pain     follows with Nsurg  . Coronary artery disease 2010    s/p CABG '10, sees Dr. Antoine Poche  . Hypertension   . Seasonal allergies     hx of  . Bronchitis     hx of  . Myocardial infarction   . Neuromuscular disorder     diabetic neuropathy  . HIV positive     Current Outpatient Prescriptions  Medication Sig Dispense Refill  . aspirin EC 81 MG tablet Take 81 mg by mouth daily.      [name] atazanavir (REYATAZ) 300 MG capsule Take 300 mg by mouth daily with breakfast.      . atorvastatin (LIPITOR) 10 MG tablet Take 1 tablet (10 mg total) by mouth daily.  30 tablet  6  . B Complex-C (B-COMPLEX WITH VITAMIN C) tablet Take 1 tablet by mouth daily.      [name] CALCIUM PO Take 1,000 mg by mouth daily.       . colchicine 0.6 MG tablet Take 0.6 mg by mouth 2 (two) times daily.      [name] lamiVUDine-zidovudine (COMBIVIR) 150-300 MG per tablet TAKE 1 TABLET BY MOUTH TWICE A DAY  60 tablet  6  . metoprolol succinate (TOPROL-XL) 25 MG 24 hr tablet Take 1 tablet (25 mg total) by mouth daily.  30 tablet  8  . pregabalin (LYRICA) 200 MG capsule Take 400 mg by mouth 2 (two) times daily.      . raltegravir (ISENTRESS) 400 MG tablet Take 400 mg by mouth 2 (two) times daily.      . ritonavir (NORVIR) 100 MG capsule Take 100 mg by mouth daily.      . traMADol (ULTRAM) 50 MG tablet Take 1 tablet (50 mg total) by mouth every 6 (six) hours as needed.  60 tablet  1  . valACYclovir (VALTREX) 500 MG tablet Take 500 mg by mouth daily.      [name] warfarin (COUMADIN) 5 MG tablet Take 10 mg by mouth daily.  No current facility-administered medications for this visit.    Allergies  Allergen Reactions  . Morphine     REACTION: severe headache  . Other Hives    Pecan  . Oxycodone-Acetaminophen     REACTION: headache  . Peanut-Containing Drug Products   . Penicillins     REACTION: red, flushed  . Promethazine Hcl     REACTION: makes him feel drunk    Family History  Problem Relation Age of Onset  . Breast cancer Mother   . Prostate cancer Father   . Breast cancer Other   . Hypertension Other   . Hyperlipidemia Other   . Stroke Other   . Diabetes Other     History   Social History  . Marital Status: Widowed    Spouse Name: N/A    Number of Children: N/A  . Years of Education: N/A   Occupational History  . Not on file.   Social History Main Topics  . Smoking status: Never Smoker   . Smokeless tobacco: Never Used     Comment: occ wine, single - married and divorced x 2; 3 kids  . Alcohol Use: Yes     Comment: occasional wine  . Drug Use: No  . Sexually Active: No     Comment: pt. given condoms   Other Topics Concern  . Not on file   Social History Narrative  . No narrative on file      Constitutional: Pt reports fever. Denies malaise, fatigue, headache or abrupt weight changes.  HEENT: Denies eye pain, eye redness, ear pain, ringing in the ears, wax buildup, runny nose, nasal congestion, bloody nose, or sore throat. Respiratory: Pt reports cough and sputum production. Denies difficulty breathing, shortness of breath.   Cardiovascular: Denies chest pain, chest tightness, palpitations or swelling in the hands or feet.  Skin: Pt reports rash. Denies redness, lesions or ulcercations.   No other specific complaints in a complete review of systems (except as listed in HPI above).     Objective:   Physical Exam  BP 110/58  Pulse 79  Temp(Src) 98.3 F (36.8 C) (Oral)  Ht 5\' 7"  (1.702 m)  Wt 187 lb (84.823 kg)  BMI 29.28 kg/m2  SpO2 98% Wt Readings from Last 3 Encounters:  10/05/12 187 lb (84.823 kg)  09/22/12 186 lb 6.4 oz (84.55 kg)  08/18/12 193 lb (87.544 kg)    General: Appears his stated age, well developed, well nourished in NAD. Skin: Warm, dry and intact. Generalized rash noted on chest, arms and back. No lesions or ulcerations noted. HEENT: Head: normal shape and size; Eyes: sclera white, no icterus, conjunctiva pink, PERRLA and EOMs intact; Ears: Tm's gray and intact, normal light reflex; Nose: mucosa pink and moist, septum midline; Throat/Mouth: Teeth present, mucosa pink and moist, no exudate, lesions or ulcerations noted.   Cardiovascular: Normal rate and rhythm. S1,S2 noted.  No murmur, rubs or gallops noted. No JVD or BLE edema. No carotid bruits noted. Pulmonary/Chest: Normal effort and scattered rhonchi and wheezing throughout. No respiratory distress. N        Assessment & Plan:   Allergic dermatitis secondary to medication:  Stop Leaquin eRx for pred taper  Acute Bronchitis, unresolved:  Will check chest xray to r/o pneumonia eRx for pred taper eRx for doxycycline in place of levaquin- check your coumadin more frequently

## 2012-10-07 ENCOUNTER — Telehealth: Payer: Self-pay | Admitting: Oncology

## 2012-10-07 NOTE — Telephone Encounter (Signed)
S/W PT IN RE NP APPT 7/02 @ 1:30 W/DR. SHADAD REFERRING DR. Dareen Piano DX- CLL PT REQUESTED A LATER APPT DUE TO VACATION WELCOME PACKET MAILED.

## 2012-10-07 NOTE — Telephone Encounter (Signed)
C/D 10/07/12 for appt. 11/10/12

## 2012-10-08 ENCOUNTER — Ambulatory Visit (INDEPENDENT_AMBULATORY_CARE_PROVIDER_SITE_OTHER): Payer: Medicare Other | Admitting: General Practice

## 2012-10-08 DIAGNOSIS — Z7901 Long term (current) use of anticoagulants: Secondary | ICD-10-CM

## 2012-10-08 LAB — POCT INR: INR: 2.1

## 2012-10-13 ENCOUNTER — Ambulatory Visit (INDEPENDENT_AMBULATORY_CARE_PROVIDER_SITE_OTHER): Payer: Medicare Other | Admitting: General Practice

## 2012-10-13 DIAGNOSIS — Z7901 Long term (current) use of anticoagulants: Secondary | ICD-10-CM

## 2012-10-13 LAB — POCT INR: INR: 1.8

## 2012-10-14 ENCOUNTER — Ambulatory Visit (INDEPENDENT_AMBULATORY_CARE_PROVIDER_SITE_OTHER): Payer: Medicare Other | Admitting: Internal Medicine

## 2012-10-14 ENCOUNTER — Encounter: Payer: Self-pay | Admitting: Internal Medicine

## 2012-10-14 VITALS — BP 112/62 | HR 86 | Temp 97.9°F | Ht 67.0 in | Wt 179.0 lb

## 2012-10-14 DIAGNOSIS — R05 Cough: Secondary | ICD-10-CM

## 2012-10-14 DIAGNOSIS — J209 Acute bronchitis, unspecified: Secondary | ICD-10-CM | POA: Diagnosis not present

## 2012-10-14 DIAGNOSIS — R059 Cough, unspecified: Secondary | ICD-10-CM | POA: Diagnosis not present

## 2012-10-14 DIAGNOSIS — R062 Wheezing: Secondary | ICD-10-CM

## 2012-10-14 DIAGNOSIS — J309 Allergic rhinitis, unspecified: Secondary | ICD-10-CM | POA: Diagnosis not present

## 2012-10-14 MED ORDER — CEFDINIR 300 MG PO CAPS: 300.0000 mg | ORAL_CAPSULE | Freq: Two times a day (BID) | ORAL | Status: DC

## 2012-10-14 NOTE — Patient Instructions (Signed)

## 2012-10-14 NOTE — Progress Notes (Signed)
Subjective:    Patient ID: [name], male    DOB: 22-Aug-1950, 62 y.o.   MRN: [id number]  HPI  Pt presents to the clinic today with c/o continued congestion. He was diagnosed with bronchitis on 10/03/12 and started on Levaquin. He took it for 2 days then developed a medication reaction. He was switched to doxycycline. Chest xray was performed on 10/05/12 with no acute findings. He feels like the congestion has gotten only slightly better. He is finished with the prednisone and doxycycline. He is going out of town for 2 weeks and does not want to be sick while out of town.  Review of Systems      Past Medical History  Diagnosis Date  . DVT, lower extremity, recurrent 2008, 2009    LLE, chronic anticoag since 2009  . Osteoarthritis, knee     s/p B TKA  . HIV infection dx 1993  . Carotid artery occlusion     40-60% right ICA stenosis (09/2008)  . Hyperlipidemia   . TIA (transient ischemic attack) 1997    mild residual L mouth droop  . H/O hiatal hernia 2008    surgery  . Diabetes mellitus     no med since 08/2011  . CLL (chronic lymphoblastic leukemia) dx 2010    Followed at [facility] q20mo, no current therapy   . Gynecomastia, male   . Impotence of organic origin   . Rheumatoid arthritis(714.0) dx 2010    MTX, follows with rheum  . Gout   . Chronic back pain     follows with Nsurg  . Coronary artery disease 2010    s/p CABG '10, sees Dr. Antoine Poche  . Hypertension   . Seasonal allergies     hx of  . Bronchitis     hx of  . Myocardial infarction   . Neuromuscular disorder     diabetic neuropathy  . HIV positive     Current Outpatient Prescriptions  Medication Sig Dispense Refill  . aspirin EC 81 MG tablet Take 81 mg by mouth daily.      [name] atazanavir (REYATAZ) 300 MG capsule Take 300 mg by mouth daily with breakfast.      . atorvastatin (LIPITOR) 10 MG tablet Take 1 tablet (10 mg total) by mouth daily.  30 tablet  6  . B Complex-C (B-COMPLEX WITH VITAMIN C) tablet Take 1  tablet by mouth daily.      [name] CALCIUM PO Take 1,000 mg by mouth daily.      . colchicine 0.6 MG tablet Take 0.6 mg by mouth 2 (two) times daily.      [name] doxycycline (VIBRA-TABS) 100 MG tablet Take 1 tablet (100 mg total) by mouth 2 (two) times daily.  20 tablet  0  . lamiVUDine-zidovudine (COMBIVIR) 150-300 MG per tablet TAKE 1 TABLET BY MOUTH TWICE A DAY  60 tablet  6  . metoprolol succinate (TOPROL-XL) 25 MG 24 hr tablet Take 1 tablet (25 mg total) by mouth daily.  30 tablet  8  . predniSONE (DELTASONE) 10 MG tablet Take 3 tablets on days 1-3, take 2 tablets on days 4-6, take 1 tablet on days 7-9  18 tablet  0  . pregabalin (LYRICA) 200 MG capsule Take 400 mg by mouth 2 (two) times daily.      . raltegravir (ISENTRESS) 400 MG tablet Take 400 mg by mouth 2 (two) times daily.      . ritonavir (NORVIR) 100 MG capsule Take 100 mg  by mouth daily.      . traMADol (ULTRAM) 50 MG tablet Take 1 tablet (50 mg total) by mouth every 6 (six) hours as needed.  60 tablet  1  . valACYclovir (VALTREX) 500 MG tablet Take 500 mg by mouth daily.      [name] warfarin (COUMADIN) 5 MG tablet Take 10 mg by mouth daily.        No current facility-administered medications for this visit.    Allergies  Allergen Reactions  . Morphine     REACTION: severe headache  . Other Hives    Pecan  . Oxycodone-Acetaminophen     REACTION: headache  . Peanut-Containing Drug Products   . Penicillins     REACTION: red, flushed  . Promethazine Hcl     REACTION: makes him feel drunk    Family History  Problem Relation Age of Onset  . Breast cancer Mother   . Prostate cancer Father   . Breast cancer Other   . Hypertension Other   . Hyperlipidemia Other   . Stroke Other   . Diabetes Other     History   Social History  . Marital Status: Widowed    Spouse Name: N/A    Number of Children: N/A  . Years of Education: N/A   Occupational History  . Not on file.   Social History Main Topics  . Smoking status: Never  Smoker   . Smokeless tobacco: Never Used     Comment: occ wine, single - married and divorced x 2; 3 kids  . Alcohol Use: Yes     Comment: occasional wine  . Drug Use: No  . Sexually Active: No     Comment: pt. given condoms   Other Topics Concern  . Not on file   Social History Narrative  . No narrative on file     Constitutional: Denies fever, malaise, fatigue, headache or abrupt weight changes.  HEENT: Pt reports nasal congestion. Denies eye pain, eye redness, ear pain, ringing in the ears, wax buildup, runny nose,  bloody nose, or sore throat. Respiratory: Pt reports cough and chest congestion. Denies difficulty breathing, shortness of breath.   Cardiovascular: Denies chest pain, chest tightness, palpitations or swelling in the hands or feet.  Skin: Denies redness, rashes, lesions or ulcercations.    No other specific complaints in a complete review of systems (except as listed in HPI above).  Objective:   Physical Exam   BP 112/62  Pulse 86  Temp(Src) 97.9 F (36.6 C) (Oral)  Ht 5\' 7"  (1.702 m)  Wt 179 lb (81.194 kg)  BMI 28.03 kg/m2  SpO2 96% Wt Readings from Last 3 Encounters:  10/14/12 179 lb (81.194 kg)  10/05/12 187 lb (84.823 kg)  09/22/12 186 lb 6.4 oz (84.55 kg)    General: Appears his stated age, well developed, well nourished in NAD. Skin: Warm, dry and intact. No rashes, lesions or ulcerations noted. HEENT: Head: normal shape and size; Eyes: sclera white, no icterus, conjunctiva pink, PERRLA and EOMs intact; Ears: Tm's gray and intact, normal light reflex; Nose: mucosa pink and moist, septum midline; Throat/Mouth: Teeth present, mucosa pink and moist, no exudate, lesions or ulcerations noted.  Cardiovascular: Normal rate and rhythm. S1,S2 noted.  No murmur, rubs or gallops noted. No JVD or BLE edema. No carotid bruits noted. Pulmonary/Chest: Normal effort and intermittent expiratory wheeze, somewhat fibrotic sounding. No respiratory distress. No rales  or ronchi noted.        Assessment &  Plan:   Acute Bronchitis, unresolved:  Reassurance given that he has been adequately treated. The cough may persist and we should treat symptomatically Can take OTC claritin for nasal congestion as this is likely related to allergies Will give RX for Omnicef to take on vacation but only take if if you truly feel you are getting worse If no better after your trip, please come back to the office, may need referral to pulmonology for evaluation. ? ILD  RTC as needed or if symptoms persist or worsen

## 2012-10-22 ENCOUNTER — Other Ambulatory Visit: Payer: Self-pay | Admitting: Family Medicine

## 2012-10-22 MED ORDER — WARFARIN SODIUM 5 MG PO TABS: ORAL_TABLET | ORAL | Status: DC

## 2012-11-01 ENCOUNTER — Ambulatory Visit (INDEPENDENT_AMBULATORY_CARE_PROVIDER_SITE_OTHER): Payer: Medicare Other | Admitting: General Practice

## 2012-11-01 DIAGNOSIS — Z7901 Long term (current) use of anticoagulants: Secondary | ICD-10-CM

## 2012-11-01 LAB — POCT INR: INR: 2.7

## 2012-11-02 DIAGNOSIS — L851 Acquired keratosis [keratoderma] palmaris et plantaris: Secondary | ICD-10-CM | POA: Diagnosis not present

## 2012-11-02 DIAGNOSIS — E1149 Type 2 diabetes mellitus with other diabetic neurological complication: Secondary | ICD-10-CM | POA: Diagnosis not present

## 2012-11-02 DIAGNOSIS — B351 Tinea unguium: Secondary | ICD-10-CM | POA: Diagnosis not present

## 2012-11-03 DIAGNOSIS — M069 Rheumatoid arthritis, unspecified: Secondary | ICD-10-CM | POA: Diagnosis not present

## 2012-11-05 ENCOUNTER — Other Ambulatory Visit: Payer: Self-pay | Admitting: Oncology

## 2012-11-05 DIAGNOSIS — C9110 Chronic lymphocytic leukemia of B-cell type not having achieved remission: Secondary | ICD-10-CM

## 2012-11-10 ENCOUNTER — Ambulatory Visit (HOSPITAL_BASED_OUTPATIENT_CLINIC_OR_DEPARTMENT_OTHER): Payer: Medicare Other | Admitting: Oncology

## 2012-11-10 ENCOUNTER — Ambulatory Visit: Payer: Medicare Other

## 2012-11-10 ENCOUNTER — Telehealth: Payer: Self-pay | Admitting: Oncology

## 2012-11-10 ENCOUNTER — Encounter: Payer: Self-pay | Admitting: Oncology

## 2012-11-10 ENCOUNTER — Other Ambulatory Visit (HOSPITAL_BASED_OUTPATIENT_CLINIC_OR_DEPARTMENT_OTHER): Payer: Medicare Other | Admitting: Lab

## 2012-11-10 VITALS — BP 137/84 | HR 81 | Temp 98.6°F | Resp 20 | Ht 67.0 in | Wt 188.8 lb

## 2012-11-10 DIAGNOSIS — C9110 Chronic lymphocytic leukemia of B-cell type not having achieved remission: Secondary | ICD-10-CM | POA: Diagnosis not present

## 2012-11-10 DIAGNOSIS — M069 Rheumatoid arthritis, unspecified: Secondary | ICD-10-CM | POA: Diagnosis not present

## 2012-11-10 LAB — CBC WITH DIFFERENTIAL/PLATELET
BASO%: 0.5 % (ref 0.0–2.0)
Basophils Absolute: 0 10*3/uL (ref 0.0–0.1)
EOS%: 0.1 % (ref 0.0–7.0)
Eosinophils Absolute: 0 10*3/uL (ref 0.0–0.5)
HCT: 38 % — ABNORMAL LOW (ref 38.4–49.9)
HGB: 13.1 g/dL (ref 13.0–17.1)
LYMPH%: 57.2 % — ABNORMAL HIGH (ref 14.0–49.0)
MCH: 39.4 pg — ABNORMAL HIGH (ref 27.2–33.4)
MCHC: 34.3 g/dL (ref 32.0–36.0)
MCV: 114.8 fL — ABNORMAL HIGH (ref 79.3–98.0)
MONO#: 0.1 10*3/uL (ref 0.1–0.9)
MONO%: 1.7 % (ref 0.0–14.0)
NEUT#: 2.9 10*3/uL (ref 1.5–6.5)
NEUT%: 40.5 % (ref 39.0–75.0)
Platelets: 195 10*3/uL (ref 140–400)
RBC: 3.31 10*6/uL — ABNORMAL LOW (ref 4.20–5.82)
RDW: 16.4 % — ABNORMAL HIGH (ref 11.0–14.6)
WBC: 7.1 10*3/uL (ref 4.0–10.3)
lymph#: 4.1 10*3/uL — ABNORMAL HIGH (ref 0.9–3.3)

## 2012-11-10 LAB — COMPREHENSIVE METABOLIC PANEL (CC13)
ALT: 36 U/L (ref 0–55)
AST: 44 U/L — ABNORMAL HIGH (ref 5–34)
Albumin: 3.6 g/dL (ref 3.5–5.0)
Alkaline Phosphatase: 97 U/L (ref 40–150)
BUN: 15.1 mg/dL (ref 7.0–26.0)
CO2: 25 mEq/L (ref 22–29)
Calcium: 8.5 mg/dL (ref 8.4–10.4)
Chloride: 106 mEq/L (ref 98–109)
Creatinine: 0.9 mg/dL (ref 0.7–1.3)
Glucose: 131 mg/dl (ref 70–140)
Potassium: 4.3 mEq/L (ref 3.5–5.1)
Sodium: 138 mEq/L (ref 136–145)
Total Bilirubin: 2.83 mg/dL — ABNORMAL HIGH (ref 0.20–1.20)
Total Protein: 6.5 g/dL (ref 6.4–8.3)

## 2012-11-10 LAB — TECHNOLOGIST REVIEW

## 2012-11-10 LAB — CHCC SMEAR

## 2012-11-10 NOTE — Progress Notes (Signed)
Checked in new pt with no financial concerns. °

## 2012-11-10 NOTE — Telephone Encounter (Signed)
gv pt appt schedule for January 2015. [facility] will contact pt w/ct appt. Pt aware. Per pt appts scheduled on Thursdays one apart.

## 2012-11-10 NOTE — Progress Notes (Signed)
Reason for Referral: CLL.   HPI: 62 year old gentleman native of [place] and currently living in [place],  [place]. He is a gentleman with complex past medical history that includes HIV and autoimmune arthritis as well as the diagnosis of CLL since 2007. He used to get his most of his care at [facility] but has transitioned most of his care here in [place]. His diagnosis of CLL dates back to 2007 where he was noted to have lymphocytosis on his peripheral blood smear. His workup included a peripheral blood flow cytometry which showed a CD5, CD11c, CD19, CD20, CD23 and lambda restricted monoclonal B-cell population. He was a ZAP 70 positive. He was given as a rise stage 0 CD38 positive ZAP 70 positive T cell CLL. He did develop diffuse lymphadenopathy based on the CT scan that was done in 2012th the largest of his lymph node showed a 2.1 x 1.3 aortocaval lymph node most of his lymph nodes are cell centimeters. Patient had been on observation and surveillance since 2007 and was last evaluated by oncology in 2012th by Dr. Leonard Downing. Patient referred to me for evaluation to establish care regarding his CLL. Clinically, he is asymptomatic from that. He is not reporting any symptoms of fevers, chills, sweats, weight loss or decline in his energy or performance status he has not reported any recurrent sinopulmonary infections. Has not reported any immune dysregulation problems. He has been recently on Remicade phlebotomy on arthritis which have helped his symptoms .   Past Medical History  Diagnosis Date  . DVT, lower extremity, recurrent 2008, 2009    LLE, chronic anticoag since 2009  . Osteoarthritis, knee     s/p B TKA  . HIV infection dx 1993  . Carotid artery occlusion     40-60% right ICA stenosis (09/2008)  . Hyperlipidemia   . TIA (transient ischemic attack) 1997    mild residual L mouth droop  . H/O hiatal hernia 2008    surgery  . Diabetes mellitus     no med since  08/2011  . CLL (chronic lymphoblastic leukemia) dx 2010    Followed at [facility] q51mo, no current therapy   . Gynecomastia, male   . Impotence of organic origin   . Rheumatoid arthritis(714.0) dx 2010    MTX, follows with rheum  . Gout   . Chronic back pain     follows with Nsurg  . Coronary artery disease 2010    s/p CABG '10, sees Dr. Antoine Poche  . Hypertension   . Seasonal allergies     hx of  . Bronchitis     hx of  . Myocardial infarction   . Neuromuscular disorder     diabetic neuropathy  . HIV positive   :  Past Surgical History  Procedure Laterality Date  . Spine surgery  2010    "rod and screws", "failed"  . Cholecystectomy    . Hiatal hernia repair      1 umbilical hernia repair and left and right inguinal hernia  . Shoulder surgery      rt lft repairs  . Mandible surgery      tmj  . Varicose vein      stripping  . Knee arthroplasty      left  . Tonsillectomy    . Knee arthroplasty  07/22/2011    Procedure: COMPUTER ASSISTED TOTAL KNEE ARTHROPLASTY;  Surgeon: Cammy Copa, MD;  Location: [facility] OR;  Service: Orthopedics;  Laterality: Right;  Right  total knee arthroplasty  . Coronary artery bypass graft  2010    triple bypass  . Hardware removal N/A 07/02/2012    Procedure: HARDWARE REMOVAL;  Surgeon: Mariam Dollar, MD;  Location: [facility];  Service: Neurosurgery;  Laterality: N/A;  :  Current Outpatient Prescriptions  Medication Sig Dispense Refill  . aspirin EC 81 MG tablet Take 81 mg by mouth daily.      [name] atazanavir (REYATAZ) 300 MG capsule Take 300 mg by mouth daily with breakfast.      . atorvastatin (LIPITOR) 10 MG tablet Take 1 tablet (10 mg total) by mouth daily.  30 tablet  6  . B Complex-C (B-COMPLEX WITH VITAMIN C) tablet Take 1 tablet by mouth daily.      [name] CALCIUM PO Take 1,000 mg by mouth daily.      . cholecalciferol (VITAMIN D) 1000 UNITS tablet Take 1,000 Units by mouth daily. 2 capsules daily      . EPINEPHrine (EPI-PEN) 0.3 mg/0.3 mL SOAJ  Inject 0.3 mg into the muscle once. As needed for allergic reaction      . InFLIXimab (REMICADE IV) Inject into the vein.      [name] lamiVUDine-zidovudine (COMBIVIR) 150-300 MG per tablet TAKE 1 TABLET BY MOUTH TWICE A DAY  60 tablet  6  . pregabalin (LYRICA) 200 MG capsule Take 400 mg by mouth 2 (two) times daily.      . raltegravir (ISENTRESS) 400 MG tablet Take 400 mg by mouth 2 (two) times daily.      . ritonavir (NORVIR) 100 MG capsule Take 100 mg by mouth daily.      . tadalafil (CIALIS) 20 MG tablet Take 20 mg by mouth daily as needed for erectile dysfunction.      . valACYclovir (VALTREX) 500 MG tablet Take 500 mg by mouth daily.      [name] warfarin (COUMADIN) 5 MG tablet Take per instructions from [facility]  60 tablet  2   No current facility-administered medications for this visit.     Allergies  Allergen Reactions  . Morphine     REACTION: severe headache  . Other Hives    Pecan  . Oxycodone-Acetaminophen     REACTION: headache  . Peanut-Containing Drug Products   . Penicillins     REACTION: red, flushed  . Promethazine Hcl     REACTION: makes him feel drunk  :  Family History  Problem Relation Age of Onset  . Breast cancer Mother   . Prostate cancer Father   . Breast cancer Other   . Hypertension Other   . Hyperlipidemia Other   . Stroke Other   . Diabetes Other   :  History   Social History  . Marital Status: Widowed    Spouse Name: N/A    Number of Children: N/A  . Years of Education: N/A   Occupational History  . Not on file.   Social History Main Topics  . Smoking status: Never Smoker   . Smokeless tobacco: Never Used     Comment: occ wine, single - married and divorced x 2; 3 kids  . Alcohol Use: Yes     Comment: occasional wine  . Drug Use: No  . Sexually Active: No     Comment: pt. given condoms   Other Topics Concern  . Not on file   Social History Narrative  . No narrative on file  :  A comprehensive review of systems was  negative.  Exam: ECOG 1 Blood pressure 137/84, pulse 81, temperature 98.6 F (37 C), temperature source Oral, resp. rate 20, height 5\' 7"  (1.702 m), weight 188 lb 12.8 oz (85.639 kg). General appearance: alert, cooperative and no distress Head: Normocephalic, without obvious abnormality, atraumatic Nose: Nares normal. Septum midline. Mucosa normal. No drainage or sinus tenderness. Throat: lips, mucosa, and tongue normal; teeth and gums normal Neck: no adenopathy, no carotid bruit, no JVD, supple, symmetrical, trachea midline and thyroid not enlarged, symmetric, no tenderness/mass/nodules Resp: clear to auscultation bilaterally Cardio: regular rate and rhythm, S1, S2 normal, no murmur, click, rub or gallop GI: soft, non-tender; bowel sounds normal; no masses,  no organomegaly Extremities: extremities normal, atraumatic, no cyanosis or edema Pulses: 2+ and symmetric Skin: Skin color, texture, turgor normal. No rashes or lesions Lymph nodes: Cervical, supraclavicular, and axillary nodes normal.   Recent Labs  11/10/12 1335  WBC 7.1  HGB 13.1  HCT 38.0*  PLT 195      Blood smear review: Lymphocytosis variant lymphs were noted. Macrocytosis was noted as well.    Assessment and Plan:   62 year old gentleman with the following issues:  1. CLL diagnosed in 2007 presented with stage 0 CD38 positive, ZAP 70 positive. He did have some mild lymphadenopathy on CT scan that is mostly subcentimeter with a aortocaval lymph node largest measuring 2.0 x 1.6 cm. He had been on observation and surveillance since 2007 to 2012 under the care at [facility]. Patient here to establish care and resume followup. I discussed the natural course of CLL and more specifically his diagnosis of early stage disease. His disease represents a more of an indolent and rather slow growing form of non-Hodgkin's lymphoma but have not really dramatically changed. His peripheral blood smear as well as  his lymphocyte percentage have not dramatically changed. He is continued to be asymptomatic from his disease I have not presented any complications to warrant any treatment. For that reason, about the continued observation and surveillance I will obtain a CT scan for baseline purposes before his next visit in January of 2015. I educated the patient today about possible complications associated with CLL in the future with these include cytopenias, either immune thrombocytopenia and anemia, symptomatic lymphadenopathy, recurrent sinopulmonary infections and immune dysregulation. At this point, he is not experiencing any of these I will continue watch and wait approach.  2. Macrocytosis: As undoubtedly related to his HIV medications I will continue to observe that no intervention is needed.

## 2012-11-16 ENCOUNTER — Ambulatory Visit (INDEPENDENT_AMBULATORY_CARE_PROVIDER_SITE_OTHER): Payer: Medicare Other | Admitting: Internal Medicine

## 2012-11-16 ENCOUNTER — Encounter: Payer: Self-pay | Admitting: Internal Medicine

## 2012-11-16 ENCOUNTER — Telehealth: Payer: Self-pay | Admitting: *Deleted

## 2012-11-16 ENCOUNTER — Ambulatory Visit (INDEPENDENT_AMBULATORY_CARE_PROVIDER_SITE_OTHER): Payer: Medicare Other | Admitting: General Practice

## 2012-11-16 VITALS — BP 120/52 | HR 77 | Temp 98.8°F | Wt 186.0 lb

## 2012-11-16 DIAGNOSIS — F3289 Other specified depressive episodes: Secondary | ICD-10-CM | POA: Diagnosis not present

## 2012-11-16 DIAGNOSIS — F329 Major depressive disorder, single episode, unspecified: Secondary | ICD-10-CM

## 2012-11-16 DIAGNOSIS — I82409 Acute embolism and thrombosis of unspecified deep veins of unspecified lower extremity: Secondary | ICD-10-CM

## 2012-11-16 DIAGNOSIS — R7309 Other abnormal glucose: Secondary | ICD-10-CM

## 2012-11-16 DIAGNOSIS — Z7901 Long term (current) use of anticoagulants: Secondary | ICD-10-CM | POA: Diagnosis not present

## 2012-11-16 DIAGNOSIS — F32A Depression, unspecified: Secondary | ICD-10-CM

## 2012-11-16 LAB — POCT INR: INR: 2.4

## 2012-11-16 MED ORDER — GLUCOSE BLOOD VI STRP: ORAL_STRIP | Status: DC

## 2012-11-16 MED ORDER — DULOXETINE HCL 30 MG PO CPEP: 30.0000 mg | ORAL_CAPSULE | Freq: Every day | ORAL | Status: DC

## 2012-11-16 NOTE — Progress Notes (Signed)
Subjective:    Patient ID: [name], male    DOB: 16-Mar-1951, 62 y.o.   MRN: [id number]  HPI  Here for follow up -reviewed interval issues and chronic medical issues: complains of increase depression symptoms   Past Medical History  Diagnosis Date  . DVT, lower extremity, recurrent 2008, 2009    LLE, chronic anticoag since 2009  . Osteoarthritis, knee     s/p B TKA  . HIV infection dx 1993  . Carotid artery occlusion     40-60% right ICA stenosis (09/2008)  . Hyperlipidemia   . TIA (transient ischemic attack) 1997    mild residual L mouth droop  . H/O hiatal hernia 2008    surgery  . Diabetes mellitus     no med since 08/2011  . CLL (chronic lymphoblastic leukemia) dx 2010    Followed at [facility] q62mo, no current therapy   . Gynecomastia, male   . Impotence of organic origin   . Rheumatoid arthritis(714.0) dx 2010    MTX, follows with rheum  . Gout   . Chronic back pain     follows with Nsurg  . Coronary artery disease 2010    s/p CABG '10, sees Dr. Antoine Poche  . Hypertension   . Seasonal allergies     hx of  . Bronchitis     hx of  . Myocardial infarction   . Neuromuscular disorder     diabetic neuropathy  . HIV positive     Review of Systems  Constitutional: Positive for activity change and fatigue. Negative for fever, chills, appetite change and unexpected weight change.  Eyes: Negative for visual disturbance.  Respiratory: Negative for cough, shortness of breath and wheezing. Choking: trouble swallowing > 2 years    Cardiovascular: Negative for chest pain, palpitations and leg swelling.  Gastrointestinal: Negative for nausea, abdominal pain, diarrhea, constipation and abdominal distention.  Musculoskeletal: Positive for myalgias, back pain, joint swelling, arthralgias and gait problem.  Skin: Negative for pallor and wound.  Allergic/Immunologic: Positive for immunocompromised state.  Neurological: Positive for tremors (R hand, with ADLs) and weakness  (generalized - BLE and hands). Negative for dizziness, seizures, facial asymmetry, speech difficulty, numbness and headaches.  Psychiatric/Behavioral: Positive for dysphoric mood and decreased concentration. Negative for suicidal ideas, behavioral problems, confusion and self-injury. The patient is not nervous/anxious.        Objective:   Physical Exam  BP 120/52  Pulse 77  Temp(Src) 98.8 F (37.1 C) (Oral)  Wt 186 lb (84.369 kg)  BMI 29.12 kg/m2  SpO2 95% Wt Readings from Last 3 Encounters:  11/16/12 186 lb (84.369 kg)  11/10/12 188 lb 12.8 oz (85.639 kg)  10/14/12 179 lb (81.194 kg)   Constitutional:  He is frail, but appears well-developed and well-nourished. No distress.  Eyes: wears corrective lenses - PERRL, EOMI Neck: Normal range of motion. Neck supple. No JVD or LAD present. No thyromegaly present.  Cardiovascular: Normal rate, regular rhythm and normal heart sounds.  No murmur heard. no BLE edema Pulmonary/Chest: Effort normal and breath sounds normal. No respiratory distress. no wheezes. Musculoskeletal: spine deformity with prominence of lumbar rod at lower T level and "bony tumor" mid T spine. tender to direct palpation - gait is forward bent (90 degrees+) at hips, aided with cane. B knees s/p TKR, no effusion - B hands with MCP radial deviation. no synovitis or effusions.  Neurological: he is alert and oriented to person, place, and time. No cranial nerve deficit. Coordination, speech  and recall normal. Poor B hand grip, no detectable tremor at this time Skin: very tan - Skin is warm and dry.  No erythema or ulceration.  Psychiatric: he has a dysphoric mood and affect. behavior is normal. Judgment and thought content normal.   Lab Results  Component Value Date   WBC 6.6 11/18/2011   HGB 12.3* 11/18/2011   HCT 35.4* 11/18/2011   PLT 172 11/18/2011   GLUCOSE 76 11/18/2011   CHOL 171 03/20/2011   TRIG 183* 03/20/2011   HDL 40 03/20/2011   LDLCALC 94 03/20/2011   ALT 23 11/18/2011    AST 28 11/18/2011   NA 145 11/18/2011   K 4.0 11/18/2011   CL 109 11/18/2011   CREATININE 0.87 11/18/2011   BUN 19 11/18/2011   CO2 30 11/18/2011   TSH 0.658 * 09/15/2008   INR 1.4 06/22/2012   HGBA1C  Value: 4.7 . 10/04/2008       Assessment & Plan:    See problem list. Medications and labs reviewed today.

## 2012-11-16 NOTE — Assessment & Plan Note (Signed)
Increasing symptoms - previously on prozac, then sertraline, but none since 2007 Select cymbalta, also for help with chronic pain syndrome we reviewed potential risk/benefit and possible side effects - pt understands and agrees to same  Verified no si/hi follow up 4-6 weeks to review symptoms and titrate as needed - pt to call sooner if possible

## 2012-11-16 NOTE — Telephone Encounter (Signed)
Received fax stating pls send the exact type of strips, direction, and dx code for medicare. We don't know what meter pt is using. Called pt to verify which monitor he is using no answer LMOM RTC...Raechel Chute

## 2012-11-16 NOTE — Patient Instructions (Signed)
It was good to see you today. We have reviewed your prior records including labs and tests today Medications reviewed and updated Start cymbalta once daily - Your prescription(s) have been submitted to your pharmacy. Please take as directed and contact our office if you believe you are having problem(s) with the medication(s). Continue working with your other specialists as reviewed - followup in 4-6 weeks to review symptoms and medications, call sooner if needed

## 2012-11-17 MED ORDER — GLUCOSE BLOOD VI STRP: ORAL_STRIP | Status: DC

## 2012-11-17 NOTE — Telephone Encounter (Signed)
Called pt again still no answer LMOM RTC ASAP with info...lmb

## 2012-11-17 NOTE — Telephone Encounter (Signed)
Notified pt he states he is using the one touch monitor inform pt will fax to [facility] in [place]...Raechel Chute

## 2012-11-22 ENCOUNTER — Other Ambulatory Visit: Payer: Self-pay

## 2012-11-22 ENCOUNTER — Telehealth: Payer: Self-pay | Admitting: General Practice

## 2012-11-22 ENCOUNTER — Other Ambulatory Visit: Payer: Self-pay | Admitting: Cardiology

## 2012-11-22 MED ORDER — METOPROLOL SUCCINATE ER 25 MG PO TB24: 25.0000 mg | ORAL_TABLET | Freq: Every day | ORAL | Status: DC

## 2012-11-22 MED ORDER — ATORVASTATIN CALCIUM 10 MG PO TABS: 10.0000 mg | ORAL_TABLET | Freq: Every day | ORAL | Status: DC

## 2012-11-22 NOTE — Telephone Encounter (Signed)
Patient called to inform that he has started Cymbalta.  This RN asked patient to check INR on Wednesday 7/16 and call clinic with results [phone]).

## 2012-11-24 ENCOUNTER — Ambulatory Visit (INDEPENDENT_AMBULATORY_CARE_PROVIDER_SITE_OTHER): Payer: Medicare Other | Admitting: General Practice

## 2012-11-24 DIAGNOSIS — Z7901 Long term (current) use of anticoagulants: Secondary | ICD-10-CM

## 2012-11-24 LAB — POCT INR: INR: 3

## 2012-11-25 ENCOUNTER — Other Ambulatory Visit: Payer: Medicare Other

## 2012-11-25 DIAGNOSIS — M069 Rheumatoid arthritis, unspecified: Secondary | ICD-10-CM | POA: Diagnosis not present

## 2012-11-26 ENCOUNTER — Encounter: Payer: Self-pay | Admitting: Internal Medicine

## 2012-11-29 ENCOUNTER — Other Ambulatory Visit: Payer: Self-pay | Admitting: Licensed Clinical Social Worker

## 2012-11-29 DIAGNOSIS — B20 Human immunodeficiency virus [HIV] disease: Secondary | ICD-10-CM

## 2012-11-29 MED ORDER — RALTEGRAVIR POTASSIUM 400 MG PO TABS: 400.0000 mg | ORAL_TABLET | Freq: Two times a day (BID) | ORAL | Status: DC

## 2012-12-01 ENCOUNTER — Other Ambulatory Visit: Payer: Medicare Other

## 2012-12-02 ENCOUNTER — Ambulatory Visit (INDEPENDENT_AMBULATORY_CARE_PROVIDER_SITE_OTHER): Payer: Medicare Other | Admitting: Neurology

## 2012-12-02 ENCOUNTER — Other Ambulatory Visit: Payer: Self-pay | Admitting: Neurology

## 2012-12-02 ENCOUNTER — Encounter: Payer: Self-pay | Admitting: Neurology

## 2012-12-02 ENCOUNTER — Other Ambulatory Visit: Payer: Medicare Other

## 2012-12-02 VITALS — BP 110/68 | HR 64 | Temp 97.7°F | Wt 185.0 lb

## 2012-12-02 DIAGNOSIS — R413 Other amnesia: Secondary | ICD-10-CM | POA: Diagnosis not present

## 2012-12-02 DIAGNOSIS — F32A Depression, unspecified: Secondary | ICD-10-CM

## 2012-12-02 DIAGNOSIS — F329 Major depressive disorder, single episode, unspecified: Secondary | ICD-10-CM | POA: Diagnosis not present

## 2012-12-02 DIAGNOSIS — F410 Panic disorder [episodic paroxysmal anxiety] without agoraphobia: Secondary | ICD-10-CM

## 2012-12-02 DIAGNOSIS — F3289 Other specified depressive episodes: Secondary | ICD-10-CM

## 2012-12-02 DIAGNOSIS — B20 Human immunodeficiency virus [HIV] disease: Secondary | ICD-10-CM

## 2012-12-02 NOTE — Progress Notes (Signed)
NEUROLOGY FOLLOW UP NOTE  [name] MRN: [id number] DOB: Mar 29, 1951  HISTORY OF PRESENT ILLNESS: [name] is a 62 y.o. male with history of HIV, RA, TIA, peripheral neuropathy, recurrent DVT on warfarin, and lumbar spine surgery who presents for follow up regarding memory problems.  He was previously seen by Dr. Smiley Houseman.  Notes, images and labs personally reviewed.  He has history of baseline left sided weakness.  He came and saw Dr. Smiley Houseman for several issues.  He had difficulty swallowing, however previous swallow study from 05/20/10 was unremarkable.  He was also concerned about his memory, as he was often losing his train of thought.  Another issue was is balance and gait.  Dr. Smiley Houseman suspected memory problems were multifactorial, and balance problems were due to worsening arthritis and sequela of lumbar surgery.  MRI of brain was performed on 07/14/12 to rule out a new stroke.  This was reviewed and revealed mild atrophy and white matter changes, but no acute or subacute infarcts.  He was recently seen by Dr. Felicity Coyer, his PCP, who started him on cymbalta for increased depression and chronic pain.  Today he reports other symptoms as well.  He has had episodes where he would suddenly have a sense of shutting down, like "a motor running and then switched off."  This is accompanied by a feeling of impending doom.  It usually lasts a couple of seconds but recently it occurred while in the car and lasted longer, causing him to pull over.  There is no loss of consciousness or headache.  He also continues to stumble, although he has not had any falls.  He reports excessive drooling.    Regarding memory, it has been a progressive problem over several years.  He reports that he had an outside neuropsychological test 3 years ago at [facility].  He said that he was told he was slow to respond, but doesn't know the exact results of the test.  He reports memory problems as well as processing information.  He now has  difficulty performing crossword puzzles.  He forgets appointments and once drove to the wrong clinic for an appointment.  If he is going out to the mailbox and gets side-tracked, he will then forget to go to the mailbox.  He also has forgotten the name of his dog.  He calls his car "Malena Catholic" and has at times forgotten that name.  When he writes a check, he will misprint the date anywhere from the 1950s to the present.  He has not gotten lost while driving.  He lives alone and is able to perform all his ADLs.  Labs: 11/24/12: INR 3.0 08/03/12: HIV RNA Quant <20, HIV RNA Quant, Log <1.30, RPR non-reactive, CD4 T cell Abs 560, CD4% Helper T cells 13%.  Carotid doppler from May 2010 reportedly showed right ICA stenosis 40-59%.  Study performed on 05/28/11 revealed bilateral stenosis 0-39%.  PAST MEDICAL HISTORY: Past Medical History  Diagnosis Date  . DVT, lower extremity, recurrent 2008, 2009    LLE, chronic anticoag since 2009  . Osteoarthritis, knee     s/p B TKA  . HIV infection dx 1993  . Carotid artery occlusion     40-60% right ICA stenosis (09/2008)  . Hyperlipidemia   . TIA (transient ischemic attack) 1997    mild residual L mouth droop  . H/O hiatal hernia 2008    surgery  . Diabetes mellitus     no med since 08/2011  .  CLL (chronic lymphoblastic leukemia) dx 2010    Followed at [facility] q43mo, no current therapy   . Gynecomastia, male   . Impotence of organic origin   . Rheumatoid arthritis(714.0) dx 2010    MTX, follows with rheum  . Gout   . Chronic back pain     follows with Nsurg  . Coronary artery disease 2010    s/p CABG '10, sees Dr. Antoine Poche  . Hypertension   . Seasonal allergies     hx of  . Bronchitis     hx of  . Myocardial infarction   . Neuromuscular disorder     diabetic neuropathy  . HIV positive     PAST SURGICAL HISTORY: Past Surgical History  Procedure Laterality Date  . Spine surgery  2010    "rod and screws", "failed"  . Cholecystectomy    .  Hiatal hernia repair      1 umbilical hernia repair and left and right inguinal hernia  . Shoulder surgery      rt lft repairs  . Mandible surgery      tmj  . Varicose vein      stripping  . Knee arthroplasty      left  . Tonsillectomy    . Knee arthroplasty  07/22/2011    Procedure: COMPUTER ASSISTED TOTAL KNEE ARTHROPLASTY;  Surgeon: Cammy Copa, MD;  Location: [facility];  Service: Orthopedics;  Laterality: Right;  Right total knee arthroplasty  . Coronary artery bypass graft  2010    triple bypass  . Hardware removal N/A 07/02/2012    Procedure: HARDWARE REMOVAL;  Surgeon: Mariam Dollar, MD;  Location: [facility] OR[facility];  Service: Neurosurgery;  Laterality: N/A;    MEDICATIONS: Current Outpatient Prescriptions on File Prior to Visit  Medication Sig Dispense Refill  . aspirin EC 81 MG tablet Take 81 mg by mouth daily.      [name] atazanavir (REYATAZ) 300 MG capsule Take 300 mg by mouth daily with breakfast.      . atorvastatin (LIPITOR) 10 MG tablet Take 1 tablet (10 mg total) by mouth daily.  30 tablet  6  . CALCIUM PO Take 1,000 mg by mouth daily.      . cholecalciferol (VITAMIN D) 1000 UNITS tablet Take 1,000 Units by mouth daily. 2 capsules daily      . DULoxetine (CYMBALTA) 30 MG capsule Take 1 capsule (30 mg total) by mouth daily.  30 capsule  3  . EPINEPHrine (EPI-PEN) 0.3 mg/0.3 mL SOAJ Inject 0.3 mg into the muscle once. As needed for allergic reaction      . glucose blood (ONE TOUCH TEST STRIPS) test strip Use as instructed  100 each  5  . glucose blood test strip Use as instructed  100 each  12  . InFLIXimab (REMICADE IV) Inject into the vein.      [name] lamiVUDine-zidovudine (COMBIVIR) 150-300 MG per tablet TAKE 1 TABLET BY MOUTH TWICE A DAY  60 tablet  6  . metoprolol succinate (TOPROL-XL) 25 MG 24 hr tablet Take 1 tablet (25 mg total) by mouth daily.  30 tablet  6  . pregabalin (LYRICA) 200 MG capsule Take 400 mg by mouth 2 (two) times daily.      . raltegravir (ISENTRESS) 400 MG  tablet Take 1 tablet (400 mg total) by mouth 2 (two) times daily.  60 tablet  3  . ritonavir (NORVIR) 100 MG capsule Take 100 mg by mouth daily.      [name]  tadalafil (CIALIS) 20 MG tablet Take 20 mg by mouth daily as needed for erectile dysfunction.      . valACYclovir (VALTREX) 500 MG tablet Take 500 mg by mouth daily.      [name] warfarin (COUMADIN) 5 MG tablet Take per instructions from [facility]  60 tablet  2  . B Complex-C (B-COMPLEX WITH VITAMIN C) tablet Take 1 tablet by mouth daily.       No current facility-administered medications on file prior to visit.    ALLERGIES: Allergies  Allergen Reactions  . Morphine     REACTION: severe headache  . Other Hives    Pecan  . Oxycodone-Acetaminophen     REACTION: headache  . Peanut-Containing Drug Products   . Penicillins     REACTION: red, flushed  . Promethazine Hcl     REACTION: makes him feel drunk    FAMILY HISTORY: Family History  Problem Relation Age of Onset  . Breast cancer Mother   . Prostate cancer Father   . Breast cancer Other   . Hypertension Other   . Hyperlipidemia Other   . Stroke Other   . Diabetes Other     SOCIAL HISTORY: History   Social History  . Marital Status: Widowed    Spouse Name: N/A    Number of Children: N/A  . Years of Education: N/A   Occupational History  . Not on file.   Social History Main Topics  . Smoking status: Never Smoker   . Smokeless tobacco: Never Used     Comment: occ wine, single - married and divorced x 2; 3 kids  . Alcohol Use: Yes     Comment: occasional wine  . Drug Use: No  . Sexually Active: No     Comment: pt. given condoms   Other Topics Concern  . Not on file   Social History Narrative  . No narrative on file    REVIEW OF SYSTEMS: Constitutional: No fevers, chills, or sweats, no generalized fatigue, change in appetite Eyes: No visual changes, double vision, eye pain Ear, nose and throat: No hearing loss, ear pain, nasal congestion, sore  throat Cardiovascular: No chest pain, palpitations Respiratory:  No shortness of breath at rest or with exertion, wheezes GastrointestinaI: No nausea, vomiting, diarrhea, abdominal pain, fecal incontinence Genitourinary:  No dysuria, urinary retention or frequency Musculoskeletal:  neck pain, back pain Integumentary: No rash, pruritus, skin lesions Neurological: as above Psychiatric: depression Endocrine: No palpitations, fatigue, diaphoresis, mood swings, change in appetite, change in weight, increased thirst Hematologic/Lymphatic:  No anemia, purpura, petechiae. Allergic/Immunologic: no itchy/runny eyes, nasal congestion, recent allergic reactions, rashes  PHYSICAL EXAM: Filed Vitals:   12/02/12 0942  BP: 110/68  Pulse: 64  Temp: 97.7 F (36.5 C)   General: No acute distress Head:  Normocephalic/atraumatic Neck: supple, no paraspinal tenderness, full range of motion Back: significant kyphosis Heart: regular rate and rhythm Lungs: Clear to auscultation bilaterally. Vascular: No carotid bruits. Neurological Exam: Mental status: alert and oriented to person, place, time and self, speech fluent and not dysarthric, language intact, serial 7 substraction correct, delayed recall intact, able to copy intersecting pentagons correctly, able to draw clock correctly, MMSE 30/30. Cranial nerves: CN I: not tested CN II: pupils equal, round and reactive to light, visual fields intact, fundi unremarkable. CN III, IV, VI:  full range of motion, no nystagmus, no ptosis CN V: facial sensation intact CN VII: upper and lower face symmetric CN VIII: hearing intact CN IX, X: gag  intact, uvula midline CN XI: sternocleidomastoid and trapezius muscles intact CN XII: tongue midline Bulk & Tone: normal, no fasciculations. Motor: 5/5 throughout Sensation: reduced pinprick sensation in feet and hands (including entire left leg), reduced vibration sensation in toes. Deep Tendon Reflexes: 2+ and  symmetric in UEs, 1+ patellars, absent ankles Finger to nose testing: normal Gait: severe flexed posture but normal stride. Romberg negative.  IMPRESSION & PLAN: [name] is a 62 y.o. male with HIV, significant lumbar kyphosis and surgery, peripheral neuropathy and RA who presents for follow up for several issues. 1.  Memory problems.  I feel this is most likely related to anxiety, but given his reported symptoms such as writing incorrect dates (off by decades) and forgetting his dog's name, I would like a more thorough and sensitive test to sort this out.  - Formal neuropsychological examination.  It's been at least 3 years since his last one. 2.  Episodes of "shutting down" and impending doom- likely panic attacks.  - Taking cymbalta  - May benefit from formal counseling in addition to antidepressant medication. 3.  Gait instability-multifactorial due to lumbar spine and neuropathy. 4.  Follow up after neuropsych testing to discuss.  45 minutes spent with patient, over 50% spent counseling and coordinating care.  Thank you for allowing me to take part in the care of this patient.  Shon Millet, DO  CC: Rene Paci, MD

## 2012-12-02 NOTE — Patient Instructions (Addendum)
We will get a neuropsychological testing to evaluate cognition.  I think you may be experiencing panic attacks.  Please discuss this with Dr. Felicity Coyer.  You may need counseling as well as medications.  We will [facility] located at [address] in [place] for memory testing with Dr. Leonides Cave  . He will call you to make the appointment.   [phone].

## 2012-12-03 ENCOUNTER — Ambulatory Visit (INDEPENDENT_AMBULATORY_CARE_PROVIDER_SITE_OTHER): Payer: Medicare Other | Admitting: General Practice

## 2012-12-03 DIAGNOSIS — I82409 Acute embolism and thrombosis of unspecified deep veins of unspecified lower extremity: Secondary | ICD-10-CM

## 2012-12-03 DIAGNOSIS — Z7901 Long term (current) use of anticoagulants: Secondary | ICD-10-CM

## 2012-12-03 DIAGNOSIS — I82401 Acute embolism and thrombosis of unspecified deep veins of right lower extremity: Secondary | ICD-10-CM

## 2012-12-03 LAB — HIV-1 RNA QUANT-NO REFLEX-BLD
HIV 1 RNA Quant: 24 copies/mL — ABNORMAL HIGH (ref <20)
HIV-1 RNA Quant, Log: 1.38 {Log} — ABNORMAL HIGH (ref <1.30)

## 2012-12-03 LAB — T-HELPER CELL (CD4) - (RCID CLINIC ONLY)
CD4 % Helper T Cell: 17 % — ABNORMAL LOW (ref 33–55)
CD4 T Cell Abs: 880 uL (ref 400–2700)

## 2012-12-03 LAB — POCT INR: INR: 2.2

## 2012-12-09 ENCOUNTER — Encounter: Payer: Self-pay | Admitting: Internal Medicine

## 2012-12-09 ENCOUNTER — Ambulatory Visit (INDEPENDENT_AMBULATORY_CARE_PROVIDER_SITE_OTHER): Payer: Medicare Other | Admitting: Internal Medicine

## 2012-12-09 ENCOUNTER — Other Ambulatory Visit: Payer: Self-pay | Admitting: *Deleted

## 2012-12-09 VITALS — BP 136/73 | HR 65 | Temp 97.8°F | Ht 69.0 in | Wt 186.0 lb

## 2012-12-09 DIAGNOSIS — B20 Human immunodeficiency virus [HIV] disease: Secondary | ICD-10-CM

## 2012-12-09 DIAGNOSIS — M069 Rheumatoid arthritis, unspecified: Secondary | ICD-10-CM

## 2012-12-09 MED ORDER — PREGABALIN 200 MG PO CAPS: 400.0000 mg | ORAL_CAPSULE | Freq: Two times a day (BID) | ORAL | Status: DC

## 2012-12-09 NOTE — Assessment & Plan Note (Signed)
I do not feel his mild LFT abnormality is any issue with his remicaide.  His T bili is elevated due to drug effect of Reyataz.

## 2012-12-09 NOTE — Assessment & Plan Note (Signed)
Doing well, rtc 3-4 months

## 2012-12-09 NOTE — Progress Notes (Signed)
  Subjective:    Patient ID: [name], male    DOB: [birth year], 62 y.o.   MRN: [id number]  HPI He comes in for routine follow up.  He continues on Reyataz, norvir, Combivir and Isentress.  He denies any missed doses.  He feels well on remicaide for RA. Has not been able to get breast reduction surgery due to cost.  Needs refill of lyrica.     Review of Systems  Constitutional: Negative for fever and fatigue.  HENT: Negative for sore throat and trouble swallowing.   Eyes: Negative for visual disturbance.  Respiratory: Negative for shortness of breath.   Gastrointestinal: Negative for nausea, abdominal pain, diarrhea and abdominal distention.  Musculoskeletal: Positive for arthralgias.       Much improved on remicaide  Skin: Negative for rash.  Neurological: Negative for dizziness and headaches.  Hematological: Negative for adenopathy.  Psychiatric/Behavioral: Negative for dysphoric mood.       Objective:   Physical Exam  Constitutional: He is oriented to person, place, and time. He appears well-developed.  HENT:  Mouth/Throat: No oropharyngeal exudate.  Eyes: Right eye exhibits no discharge. Left eye exhibits no discharge. No scleral icterus.  Cardiovascular: Normal rate, regular rhythm and normal heart sounds.   No murmur heard. Pulmonary/Chest: Effort normal and breath sounds normal. No respiratory distress. He has no wheezes.  Musculoskeletal:  Spinal deformity Changes of RA in hands  Lymphadenopathy:    He has no cervical adenopathy.  Neurological: He is alert and oriented to person, place, and time.  Skin: Skin is warm and dry. No rash noted.  Psychiatric: He has a normal mood and affect. His behavior is normal.          Assessment & Plan:

## 2012-12-14 ENCOUNTER — Other Ambulatory Visit: Payer: Self-pay | Admitting: General Practice

## 2012-12-14 MED ORDER — ENOXAPARIN SODIUM 80 MG/0.8ML ~~LOC~~ SOLN: 80.0000 mg | Freq: Two times a day (BID) | SUBCUTANEOUS | Status: DC

## 2012-12-15 ENCOUNTER — Telehealth: Payer: Self-pay | Admitting: General Practice

## 2012-12-15 DIAGNOSIS — R7989 Other specified abnormal findings of blood chemistry: Secondary | ICD-10-CM | POA: Diagnosis not present

## 2012-12-15 DIAGNOSIS — M069 Rheumatoid arthritis, unspecified: Secondary | ICD-10-CM | POA: Diagnosis not present

## 2012-12-15 NOTE — Telephone Encounter (Signed)
In reference to prior note.  Patient verbalized understanding of instructions.

## 2012-12-15 NOTE — Telephone Encounter (Signed)
Spoke with patient.  Instructions for coumadin and Lovenox pre and post spinal injection. 8/9 - Last dose of coumadin before procedure  8/10 - No coumadin and No Lovenox 8/11 - Lovenox in AM and PM (12 hours apart) - No coumadin 8/12 - Lovenox in AM and PM (12 hours apart) - No coumadin 8/13 - Lovenox in AM only - No coumadin 8/14 - Procedure (Do not take coumadin or Lovenox on this day) 8/15 - Lovenox in AM and PM and 1 1/2 tablets of coumadin 8/16 - Lovenox in AM and PM and 1 1/2 tablets of coumadin 8/17 - Lovenox in AM and PM and 1 tablet of coumadin 8/18 - Lovenox in AM and PM and 1 tablet of coumadin 8/19 - Re-check INR and Call in results.

## 2012-12-16 DIAGNOSIS — L02619 Cutaneous abscess of unspecified foot: Secondary | ICD-10-CM | POA: Diagnosis not present

## 2012-12-16 DIAGNOSIS — L03039 Cellulitis of unspecified toe: Secondary | ICD-10-CM | POA: Diagnosis not present

## 2012-12-18 DIAGNOSIS — Z888 Allergy status to other drugs, medicaments and biological substances status: Secondary | ICD-10-CM | POA: Diagnosis not present

## 2012-12-18 DIAGNOSIS — L270 Generalized skin eruption due to drugs and medicaments taken internally: Secondary | ICD-10-CM | POA: Diagnosis not present

## 2012-12-18 DIAGNOSIS — Z7982 Long term (current) use of aspirin: Secondary | ICD-10-CM | POA: Diagnosis not present

## 2012-12-18 DIAGNOSIS — Z79899 Other long term (current) drug therapy: Secondary | ICD-10-CM | POA: Diagnosis not present

## 2012-12-18 DIAGNOSIS — Z21 Asymptomatic human immunodeficiency virus [HIV] infection status: Secondary | ICD-10-CM | POA: Diagnosis not present

## 2012-12-20 ENCOUNTER — Encounter: Payer: Self-pay | Admitting: Internal Medicine

## 2012-12-21 ENCOUNTER — Ambulatory Visit: Payer: Medicare Other | Admitting: Internal Medicine

## 2012-12-23 ENCOUNTER — Telehealth: Payer: Self-pay | Admitting: Gastroenterology

## 2012-12-23 ENCOUNTER — Ambulatory Visit (INDEPENDENT_AMBULATORY_CARE_PROVIDER_SITE_OTHER): Payer: Medicare Other | Admitting: General Practice

## 2012-12-23 ENCOUNTER — Encounter: Payer: Self-pay | Admitting: Internal Medicine

## 2012-12-23 ENCOUNTER — Ambulatory Visit: Payer: Medicare Other | Admitting: Internal Medicine

## 2012-12-23 ENCOUNTER — Other Ambulatory Visit: Payer: Self-pay | Admitting: General Practice

## 2012-12-23 ENCOUNTER — Other Ambulatory Visit: Payer: Self-pay | Admitting: Gastroenterology

## 2012-12-23 ENCOUNTER — Ambulatory Visit (INDEPENDENT_AMBULATORY_CARE_PROVIDER_SITE_OTHER): Payer: Medicare Other | Admitting: Internal Medicine

## 2012-12-23 VITALS — BP 100/64 | HR 72 | Ht 67.0 in | Wt 184.4 lb

## 2012-12-23 DIAGNOSIS — Z7901 Long term (current) use of anticoagulants: Secondary | ICD-10-CM

## 2012-12-23 DIAGNOSIS — I82409 Acute embolism and thrombosis of unspecified deep veins of unspecified lower extremity: Secondary | ICD-10-CM

## 2012-12-23 DIAGNOSIS — R1310 Dysphagia, unspecified: Secondary | ICD-10-CM

## 2012-12-23 DIAGNOSIS — M961 Postlaminectomy syndrome, not elsewhere classified: Secondary | ICD-10-CM | POA: Diagnosis not present

## 2012-12-23 DIAGNOSIS — Z86010 Personal history of colonic polyps: Secondary | ICD-10-CM

## 2012-12-23 DIAGNOSIS — C9110 Chronic lymphocytic leukemia of B-cell type not having achieved remission: Secondary | ICD-10-CM

## 2012-12-23 DIAGNOSIS — B20 Human immunodeficiency virus [HIV] disease: Secondary | ICD-10-CM

## 2012-12-23 DIAGNOSIS — G894 Chronic pain syndrome: Secondary | ICD-10-CM | POA: Diagnosis not present

## 2012-12-23 LAB — POCT INR: INR: 1.1

## 2012-12-23 MED ORDER — ENOXAPARIN SODIUM 80 MG/0.8ML ~~LOC~~ SOLN: 80.0000 mg | Freq: Two times a day (BID) | SUBCUTANEOUS | Status: DC

## 2012-12-23 MED ORDER — MOVIPREP 100 G PO SOLR: ORAL | Status: DC

## 2012-12-23 NOTE — Progress Notes (Signed)
Patient ID: [name], male   DOB: 07/03/1950, 62 y.o.   MRN: [id number] HPI: [name] is a 62 yo male with PMH of HIV well-controlled on antiretrovirals therapy, CLL, rheumatoid arthritis on infliximab, history of DVT on chronic warfarin therapy, CAD, hypertension, diabetes who is seen in consultation at the request of Dr. Felicity Coyer to evaluate dysphagia. He reports a history of dysphagia and esophageal stricture. He recalls to previous upper endoscopies for dilation the last approximately 2 years ago in [place], [place]. Prior to this he recalls esophageal dilation in [place], [place] roughly 5 years ago. He specifically recalls being dilated to "17".  This previously significant help his dysphagia symptoms. He reports his dysphagia is particularly worse in the morning though persistent throughout the day. It is more to solid foods but can be with cold liquids as well. Occasionally food feels stuck in his mid chest and this hurts through to his back. He is able to clear his esophagus by taking sips of water. He is having some heartburn and reflux symptoms, but he has not started acid suppressing medication due to possible interaction with his HIV medications. Bowel habits are somewhat fluctuating for him he reports he can have loose or watery stools and then be constipated. When he is constipated he notes having to strain at stool and often feels like there is prolapse from his rectum. He has added he overt his diet which he feels has helped regulate his bowel habits. He recalls a history of colon polyps and his last colonoscopy, roughly 5 years ago. He recalls being told to have followup colonoscopy in 3-5 years.  He is currently on twice daily Lovenox and has been off warfarin for a spinal injection occurring later today  Past Medical History  Diagnosis Date  . DVT, lower extremity, recurrent 2008, 2009    LLE, chronic anticoag since 2009  . Osteoarthritis, knee     s/p B TKA  . HIV  infection dx 1993  . Carotid artery occlusion     40-60% right ICA stenosis (09/2008)  . Hyperlipidemia   . TIA (transient ischemic attack) 1997    mild residual L mouth droop  . H/O hiatal hernia 2008    surgery  . Diabetes mellitus     no med since 08/2011  . CLL (chronic lymphoblastic leukemia) dx 2010    Followed at [facility] q44mo, no current therapy   . Gynecomastia, male   . Impotence of organic origin   . Rheumatoid arthritis(714.0) dx 2010    MTX, follows with rheum  . Gout   . Chronic back pain     follows with Nsurg  . Coronary artery disease 2010    s/p CABG '10, sees Dr. Antoine Poche  . Hypertension   . Seasonal allergies     hx of  . Bronchitis     hx of  . Myocardial infarction   . Neuromuscular disorder     diabetic neuropathy  . Diverticulosis   . Gallstones   . Fibromyalgia   . Hepatitis A   . Status post dilation of esophageal narrowing     Past Surgical History  Procedure Laterality Date  . Spine surgery  2010    "rod and screws", "failed"  . Cholecystectomy    . Hiatal hernia repair      wrap  . Shoulder surgery Left   . Mandible surgery      tmj  . Varicose vein  stripping  . Replacement total knee Bilateral   . Tonsillectomy    . Knee arthroplasty  07/22/2011    Procedure: COMPUTER ASSISTED TOTAL KNEE ARTHROPLASTY;  Surgeon: Cammy Copa, MD;  Location: [facility];  Service: Orthopedics;  Laterality: Right;  Right total knee arthroplasty  . Coronary artery bypass graft  2010    triple bypass  . Hardware removal N/A 07/02/2012    Procedure: HARDWARE REMOVAL;  Surgeon: Mariam Dollar, MD;  Location: [facility] OR[facility];  Service: Neurosurgery;  Laterality: N/A;  . Inguinal hernia repair Bilateral   . Umbilical hernia repair    . Rotator cuff repair Right     Current Outpatient Prescriptions  Medication Sig Dispense Refill  . aspirin EC 81 MG tablet Take 81 mg by mouth daily.      [name] atazanavir (REYATAZ) 300 MG capsule Take 300 mg by mouth daily with  breakfast.      . atorvastatin (LIPITOR) 10 MG tablet Take 1 tablet (10 mg total) by mouth daily.  30 tablet  6  . B Complex-C (B-COMPLEX WITH VITAMIN C) tablet Take 1 tablet by mouth daily.      [name] CALCIUM PO Take 1,000 mg by mouth daily.      . cholecalciferol (VITAMIN D) 1000 UNITS tablet Take 1,000 Units by mouth daily. 2 capsules daily      . DULoxetine (CYMBALTA) 30 MG capsule Take 1 capsule (30 mg total) by mouth daily.  30 capsule  3  . enoxaparin (LOVENOX) 80 MG/0.8ML injection Inject 0.8 mLs (80 mg total) into the skin every 12 (twelve) hours.  15 Syringe  0  . EPINEPHrine (EPI-PEN) 0.3 mg/0.3 mL SOAJ Inject 0.3 mg into the muscle once. As needed for allergic reaction      . glucose blood (ONE TOUCH TEST STRIPS) test strip Use as instructed  100 each  5  . glucose blood test strip Use as instructed  100 each  12  . InFLIXimab (REMICADE IV) Inject into the vein.      [name] lamiVUDine-zidovudine (COMBIVIR) 150-300 MG per tablet TAKE 1 TABLET BY MOUTH TWICE A DAY  60 tablet  6  . metoprolol succinate (TOPROL-XL) 25 MG 24 hr tablet Take 1 tablet (25 mg total) by mouth daily.  30 tablet  6  . pregabalin (LYRICA) 200 MG capsule Take 2 capsules (400 mg total) by mouth 2 (two) times daily.  120 capsule  11  . raltegravir (ISENTRESS) 400 MG tablet Take 1 tablet (400 mg total) by mouth 2 (two) times daily.  60 tablet  3  . ritonavir (NORVIR) 100 MG capsule Take 100 mg by mouth daily.      . tadalafil (CIALIS) 20 MG tablet Take 20 mg by mouth daily as needed for erectile dysfunction.      . valACYclovir (VALTREX) 500 MG tablet Take 500 mg by mouth daily.      [name] warfarin (COUMADIN) 5 MG tablet Take per instructions from [facility]  60 tablet  2  . MOVIPREP 100 G SOLR Use per prep instruction  1 kit  0   No current facility-administered medications for this visit.    Allergies  Allergen Reactions  . Morphine     REACTION: severe headache  . Other Hives    Pecan  . Oxycodone-Acetaminophen      REACTION: headache  . Peanut-Containing Drug Products   . Penicillins     REACTION: red, flushed  . Promethazine Hcl  REACTION: makes him feel drunk    Family History  Problem Relation Age of Onset  . Breast cancer Mother   . Prostate cancer Father   . Hypertension Mother   . Hyperlipidemia Mother   . Diabetes Mother   . Colon polyps Father   . Crohn's disease Paternal Aunt   . Diabetes Maternal Grandmother   . Diabetes Brother     x 3  . Heart disease Brother     x 3  . Hyperlipidemia Father   . Hyperlipidemia Brother     x 3    History  Substance Use Topics  . Smoking status: Never Smoker   . Smokeless tobacco: Never Used     Comment: occ wine, single - married and divorced x 2; 3 kids  . Alcohol Use: Yes     Comment: occasional wine- 1-2 per week    ROS: As per history of present illness, otherwise negative  BP 100/64  Pulse 72  Ht 5\' 7"  (1.702 m)  Wt 184 lb 6 oz (83.632 kg)  BMI 28.87 kg/m2 Constitutional: Thin male in no acute distress HEENT: Normocephalic and atraumatic. Oropharynx is clear and moist. No oropharyngeal exudate. Conjunctivae are normal.  No scleral icterus. Neck: Neck supple. Trachea midline. Cardiovascular: Normal rate, regular rhythm and intact distal pulses.  Pulmonary/chest: Effort normal and breath sounds normal. No wheezing, rales or rhonchi. Abdominal: Soft, nontender, nondistended. Bowel sounds active throughout.  Extremities: Changes of rheumatoid arthritis bilateral hands, trace pretibial edema, no cyanosis Neurological: Alert and oriented to person place and time. Psychiatric: Normal mood and affect. Behavior is normal.  CD4 normal Viral load undetectable  RELEVANT LABS AND IMAGING: CBC    Component Value Date/Time   WBC 7.1 11/10/2012 1335   WBC 6.6 08/03/2012 1119   RBC 3.31* 11/10/2012 1335   RBC 3.31* 08/03/2012 1119   HGB 13.1 11/10/2012 1335   HGB 12.9* 08/03/2012 1119   HCT 38.0* 11/10/2012 1335   HCT 36.8*  08/03/2012 1119   PLT 195 11/10/2012 1335   PLT 157 08/03/2012 1119   MCV 114.8* 11/10/2012 1335   MCV 111.2* 08/03/2012 1119   MCH 39.4* 11/10/2012 1335   MCH 39.0* 08/03/2012 1119   MCHC 34.3 11/10/2012 1335   MCHC 35.1 08/03/2012 1119   RDW 16.4* 11/10/2012 1335   RDW 16.2* 08/03/2012 1119   LYMPHSABS 4.1* 11/10/2012 1335   LYMPHSABS 4.4* 08/03/2012 1119   MONOABS 0.1 11/10/2012 1335   MONOABS 0.6 08/03/2012 1119   EOSABS 0.0 11/10/2012 1335   EOSABS 0.1 08/03/2012 1119   BASOSABS 0.0 11/10/2012 1335   BASOSABS 0.0 08/03/2012 1119    CMP     Component Value Date/Time   NA 138 11/10/2012 1335   NA 140 08/03/2012 1119   K 4.3 11/10/2012 1335   K 4.2 08/03/2012 1119   CL 104 08/03/2012 1119   CO2 25 11/10/2012 1335   CO2 28 08/03/2012 1119   GLUCOSE 131 11/10/2012 1335   GLUCOSE 68* 08/03/2012 1119   BUN 15.1 11/10/2012 1335   BUN 21 08/03/2012 1119   CREATININE 0.9 11/10/2012 1335   CREATININE 0.88 08/03/2012 1119   CREATININE 0.72 07/02/2012 1757   CALCIUM 8.5 11/10/2012 1335   CALCIUM 8.8 08/03/2012 1119   PROT 6.5 11/10/2012 1335   PROT 5.6* 08/03/2012 1119   ALBUMIN 3.6 11/10/2012 1335   ALBUMIN 4.1 08/03/2012 1119   AST 44* 11/10/2012 1335   AST 33 08/03/2012 1119   ALT 36 11/10/2012  1335   ALT 28 08/03/2012 1119   ALKPHOS 97 11/10/2012 1335   ALKPHOS 89 08/03/2012 1119   BILITOT 2.83* 11/10/2012 1335   BILITOT 3.5* 08/03/2012 1119   GFRNONAA >90 07/02/2012 1757   GFRAA >90 07/02/2012 1757    ASSESSMENT/PLAN: 62 yo male with PMH of HIV well-controlled on antiretrovirals therapy, CLL, rheumatoid arthritis on infliximab, history of DVT on chronic warfarin therapy, CAD, hypertension, diabetes who is seen in consultation at the request of Dr. Felicity Coyer to evaluate dysphagia.  1.  Dysphagia/hx of esophageal stricture -- he has had several upper endoscopies for dilation in the past and I have requested these records today. Based on his symptoms, upper endoscopy is felt warranted, and after discussion of the risks and benefits he  is agreeable to proceed. He may need acid suppression medication based on findings at endoscopy and if this is the case, I will discuss acid suppression medication with his infectious disease doctor before starting it as to not interfere with the absorption of his HIV meds.  He will need to be off of warfarin therapy for the upper endoscopy, and given that he is already off we will plan for EGD next Tuesday.  He can continue Lovenox until 24 hours before his procedure. We will contact Arline Asp, his anticoagulation clinic nurse, to help with bridging instructions.  2.  Hx of colon polyps -- he reports he is due for screening colonoscopy, and wishes to proceed. Again his warfarin will need to be held as discussed in #1. We discussed colonoscopy including the risks and benefits and he is agreeable to proceed. Both procedures will be performed with monitored anesthesia care. We have requested records of previous colonoscopy and polypectomy from [place]. He also reports possible prolapse and this can be evaluated at the time of his colonoscopy. I have asked that he start daily Benefiber as well as a probiotic to try to help regulate his bowel movements.. He was given samples of Restora today.    3.  HIV -- stable with normal CD4 count on antiretroviral therapy  4.  CLL -- also stable and indolent being followed by oncology at [facility] and in [place]  5.  DVT -- not an active issue but on anticoagulation as discussed above

## 2012-12-23 NOTE — Telephone Encounter (Signed)
lvm for pt to call me back. Need to know if pt is on an diabetic medications including insulin

## 2012-12-23 NOTE — Patient Instructions (Addendum)
You have been scheduled for a colonoscopy/Endoscopy with propofol. Please follow written instructions given to you at your visit today.  Please pick up your prep kit at the pharmacy within the next 1-3 days. If you use inhalers (even only as needed), please bring them with you on the day of your procedure. Your physician has requested that you go to www.startemmi.com and enter the access code given to you at your visit today. This web site gives a general overview about your procedure. However, you should still follow specific instructions given to you by our office regarding your preparation for the procedure.  Dr. Rhea Belton recommends you start taking a probiotic daily. You can get this over the counter.  Add fiber to your diet.                                               We are excited to introduce MyChart, a new best-in-class service that provides you online access to important information in your electronic medical record. We want to make it easier for you to view your health information - all in one secure location - when and where you need it. We expect MyChart will enhance the quality of care and service we provide.  When you register for MyChart, you can:    View your test results.    Request appointments and receive appointment reminders via email.    Request medication renewals.    View your medical history, allergies, medications and immunizations.    Communicate with your physician's office through a password-protected site.    Conveniently print information such as your medication lists.  To find out if MyChart is right for you, please talk to a member of our clinical staff today. We will gladly answer your questions about this free health and wellness tool.  If you are age 64 or older and want a member of your family to have access to your record, you must provide written consent by completing a proxy form available at our office. Please speak to our clinical staff about  guidelines regarding accounts for patients younger than age 19.  As you activate your MyChart account and need any technical assistance, please call the MyChart technical support line at [phone]-[phone]) or email your question to [email or web address]. If you email your question(s), please include your name, a return phone number and the best time to reach you.  If you have non-urgent health-related questions, you can send a message to our office through MyChart at [email or web address]. If you have a medical emergency, call 911.  Thank you for using MyChart as your new health and wellness resource!   MyChart licensed from [employer],  [id number]. Patents Pending.

## 2012-12-23 NOTE — Patient Instructions (Addendum)
Continue to hold coumadin and continue Lovenox in AM and PM. 8/15 - Lovenox x 2 8/16 - Lovenox x 2 8/17 - Lovenox X 2 8/18 - Lovenox X 1 8/19 - Colonoscopy (Take nothing) 8/20 - Take nothing per Dr. Rhea Belton 8/21 - Take Lovenox in AM and PM and take 1 1/2 tablets of coumadin 8/22 - same as above 8/23 - Lovenox in AM and PM and 1 tablet of coumadin 8/24 - Lovenox in AM and PM and 1 tablet of coumadin 8/25 - Lovenox in AM and PM and 1 tablet of coumadin 8/26 - Check INR and call Bailey Mech, RN @ [phone]

## 2012-12-24 ENCOUNTER — Encounter (HOSPITAL_COMMUNITY): Payer: Self-pay | Admitting: Pharmacy Technician

## 2012-12-27 ENCOUNTER — Encounter (HOSPITAL_COMMUNITY): Payer: Self-pay | Admitting: *Deleted

## 2012-12-28 ENCOUNTER — Ambulatory Visit (HOSPITAL_COMMUNITY): Admission: RE | Admit: 2012-12-28 | Payer: Medicare Other | Source: Ambulatory Visit | Admitting: Internal Medicine

## 2012-12-28 ENCOUNTER — Encounter (HOSPITAL_COMMUNITY): Payer: Self-pay | Admitting: Anesthesiology

## 2012-12-28 ENCOUNTER — Ambulatory Visit (HOSPITAL_COMMUNITY): Payer: Medicare Other | Admitting: Anesthesiology

## 2012-12-28 ENCOUNTER — Encounter (HOSPITAL_COMMUNITY): Payer: Self-pay

## 2012-12-28 ENCOUNTER — Ambulatory Visit (HOSPITAL_COMMUNITY)
Admission: RE | Admit: 2012-12-28 | Discharge: 2012-12-28 | Disposition: A | Discharge status: Home / Self Care | Payer: Medicare Other | Source: Ambulatory Visit | Attending: Internal Medicine | Admitting: Internal Medicine

## 2012-12-28 ENCOUNTER — Encounter (HOSPITAL_COMMUNITY)
Admission: RE | Disposition: A | Discharge status: Home / Self Care | Payer: Self-pay | Source: Ambulatory Visit | Attending: Internal Medicine

## 2012-12-28 ENCOUNTER — Encounter (HOSPITAL_COMMUNITY): Admission: RE | Payer: Self-pay | Source: Ambulatory Visit

## 2012-12-28 DIAGNOSIS — Z8673 Personal history of transient ischemic attack (TIA), and cerebral infarction without residual deficits: Secondary | ICD-10-CM | POA: Diagnosis not present

## 2012-12-28 DIAGNOSIS — D126 Benign neoplasm of colon, unspecified: Secondary | ICD-10-CM | POA: Insufficient documentation

## 2012-12-28 DIAGNOSIS — M069 Rheumatoid arthritis, unspecified: Secondary | ICD-10-CM | POA: Insufficient documentation

## 2012-12-28 DIAGNOSIS — E1149 Type 2 diabetes mellitus with other diabetic neurological complication: Secondary | ICD-10-CM | POA: Diagnosis not present

## 2012-12-28 DIAGNOSIS — Z86718 Personal history of other venous thrombosis and embolism: Secondary | ICD-10-CM | POA: Diagnosis not present

## 2012-12-28 DIAGNOSIS — K2960 Other gastritis without bleeding: Secondary | ICD-10-CM | POA: Insufficient documentation

## 2012-12-28 DIAGNOSIS — E785 Hyperlipidemia, unspecified: Secondary | ICD-10-CM | POA: Diagnosis not present

## 2012-12-28 DIAGNOSIS — I10 Essential (primary) hypertension: Secondary | ICD-10-CM | POA: Diagnosis not present

## 2012-12-28 DIAGNOSIS — Z951 Presence of aortocoronary bypass graft: Secondary | ICD-10-CM | POA: Diagnosis not present

## 2012-12-28 DIAGNOSIS — I2510 Atherosclerotic heart disease of native coronary artery without angina pectoris: Secondary | ICD-10-CM | POA: Diagnosis not present

## 2012-12-28 DIAGNOSIS — K219 Gastro-esophageal reflux disease without esophagitis: Secondary | ICD-10-CM | POA: Insufficient documentation

## 2012-12-28 DIAGNOSIS — Z21 Asymptomatic human immunodeficiency virus [HIV] infection status: Secondary | ICD-10-CM | POA: Diagnosis not present

## 2012-12-28 DIAGNOSIS — K208 Other esophagitis without bleeding: Secondary | ICD-10-CM | POA: Diagnosis not present

## 2012-12-28 DIAGNOSIS — Z79899 Other long term (current) drug therapy: Secondary | ICD-10-CM | POA: Diagnosis not present

## 2012-12-28 DIAGNOSIS — K2970 Gastritis, unspecified, without bleeding: Secondary | ICD-10-CM | POA: Diagnosis not present

## 2012-12-28 DIAGNOSIS — Z96659 Presence of unspecified artificial knee joint: Secondary | ICD-10-CM | POA: Diagnosis not present

## 2012-12-28 DIAGNOSIS — R1310 Dysphagia, unspecified: Secondary | ICD-10-CM

## 2012-12-28 DIAGNOSIS — Z7901 Long term (current) use of anticoagulants: Secondary | ICD-10-CM | POA: Insufficient documentation

## 2012-12-28 DIAGNOSIS — E1142 Type 2 diabetes mellitus with diabetic polyneuropathy: Secondary | ICD-10-CM | POA: Diagnosis not present

## 2012-12-28 DIAGNOSIS — Z86010 Personal history of colonic polyps: Secondary | ICD-10-CM | POA: Diagnosis not present

## 2012-12-28 DIAGNOSIS — Z856 Personal history of leukemia: Secondary | ICD-10-CM | POA: Diagnosis not present

## 2012-12-28 DIAGNOSIS — K2210 Ulcer of esophagus without bleeding: Secondary | ICD-10-CM | POA: Diagnosis present

## 2012-12-28 DIAGNOSIS — K635 Polyp of colon: Secondary | ICD-10-CM

## 2012-12-28 HISTORY — PX: ESOPHAGOGASTRODUODENOSCOPY (EGD) WITH PROPOFOL: SHX5813

## 2012-12-28 HISTORY — PX: COLONOSCOPY WITH PROPOFOL: SHX5780

## 2012-12-28 HISTORY — DX: Gastro-esophageal reflux disease without esophagitis: K21.9

## 2012-12-28 SURGERY — ESOPHAGOGASTRODUODENOSCOPY (EGD) WITH PROPOFOL
Anesthesia: Monitor Anesthesia Care

## 2012-12-28 MED ORDER — SODIUM CHLORIDE 0.9 % IV SOLN: INTRAVENOUS | Status: DC

## 2012-12-28 MED ORDER — MIDAZOLAM HCL 5 MG/5ML IJ SOLN
INTRAMUSCULAR | Status: DC | PRN
  Administered 2012-12-28: 1 mg via INTRAVENOUS
  Administered 2012-12-28: 2 mg via INTRAVENOUS

## 2012-12-28 MED ORDER — LACTATED RINGERS IV SOLN
INTRAVENOUS | Status: DC | PRN
  Administered 2012-12-28: 10:00:00 via INTRAVENOUS

## 2012-12-28 MED ORDER — KETAMINE HCL 10 MG/ML IJ SOLN
INTRAMUSCULAR | Status: DC | PRN
  Administered 2012-12-28 (×4): 10 mg via INTRAVENOUS

## 2012-12-28 MED ORDER — PROPOFOL INFUSION 10 MG/ML OPTIME
INTRAVENOUS | Status: DC | PRN
  Administered 2012-12-28: 100 ug/kg/min via INTRAVENOUS

## 2012-12-28 NOTE — Interval H&P Note (Signed)
History and Physical Interval Note: No new complaints since being seen last week. He has been off warfarin therapy and a Lovenox bridge. Last Lovenox injection was yesterday morning. The nature of the procedures, as well as the risks, benefits, and alternatives were carefully and thoroughly reviewed with the patient. Ample time for discussion and questions allowed. The patient understood, was satisfied, and agreed to proceed.     12/28/2012 8:40 AM  [name]  has presented today for surgery, with the diagnosis of Dysphagia, history of colon polyps .[name] Possible DIL  The various methods of treatment have been discussed with the patient and family. After consideration of risks, benefits and other options for treatment, the patient has consented to  Procedure(s): ESOPHAGOGASTRODUODENOSCOPY (EGD) WITH PROPOFOL (N/A) COLONOSCOPY WITH PROPOFOL (N/A) as a surgical intervention .  The patient's history has been reviewed, patient examined, no change in status, stable for surgery.  I have reviewed the patient's chart and labs.  Questions were answered to the patient's satisfaction.     Constancia Geeting M

## 2012-12-28 NOTE — Op Note (Signed)
[facility] [address] [place], [address]   COLONOSCOPY PROCEDURE REPORT  PATIENT: [name], [name].  MR#: [id number] BIRTHDATE: 12-01-1950 , 62  yrs. old GENDER: Male ENDOSCOPIST: Beverley Fiedler, MD PROCEDURE DATE:  12/28/2012 PROCEDURE:   Colonoscopy with snare polypectomy ASA CLASS:   Class III INDICATIONS:elevated risk screening and Patient's personal history of colon polyps. MEDICATIONS: MAC sedation, administered by CRNA and See Anesthesia Report.  DESCRIPTION OF PROCEDURE:   After the risks benefits and alternatives of the procedure were thoroughly explained, informed consent was obtained.  A digital rectal exam revealed no rectal mass.   The Pentax Ped Colon [id number]  endoscope was introduced through the anus and advanced to the cecum, which was identified by both the appendix and ileocecal valve. No adverse events experienced.   The quality of the prep was Moviprep fair  The instrument was then slowly withdrawn as the colon was fully examined.   COLON FINDINGS: A sessile polyp measuring 5 mm in size was found in the transverse colon.  A polypectomy was performed with a cold snare.  The resection was complete and the polyp tissue was completely retrieved.  Possible previously placed tattoo in the proximal transverse colon without surrounding residual polyp. There was moderate diverticulosis noted in the descending colon and sigmoid colon with associated petechiae one short sigmoid segment. Retroflexed views revealed internal hemorrhoids. The time to cecum=8 minutes 0 seconds.  Withdrawal time=14 minutes 00 seconds. The scope was withdrawn and the procedure completed.  COMPLICATIONS: There were no complications.  ENDOSCOPIC IMPRESSION: 1.   Sessile polyp measuring 5 mm in size was found in the transverse colon; polypectomy was performed with a cold snare 2.   There was moderate diverticulosis noted in the descending colon and sigmoid colon 3.     Internal hemorrhoids  RECOMMENDATIONS: 1.  Await pathology results 2.  High fiber diet 3.  Repeat Colonoscopy in 5 years. 4.  You will receive a letter within 1-2 weeks with the results of your biopsy as well as final recommendations.  Please call my office if you have not received a letter after 3 weeks.   eSigned:  Beverley Fiedler, MD 12/28/2012 10:43 AM   cc: The Patient; Staci Righter, MD

## 2012-12-28 NOTE — Preoperative (Signed)
Beta Blockers   Reason not to administer Beta Blockers:Took Metoprolol last pm.

## 2012-12-28 NOTE — Anesthesia Preprocedure Evaluation (Signed)
Anesthesia Evaluation  Patient identified by MRN, date of birth, ID band Patient awake    Reviewed: Allergy & Precautions, H&P , Patient's Chart, lab work & pertinent test results, reviewed documented beta blocker date and time   History of Anesthesia Complications (+) PONVNegative for: history of anesthetic complications  Airway Mallampati: II TM Distance: >3 FB Neck ROM: full    Dental no notable dental hx. (+) Teeth Intact and Dental Advisory Given   Pulmonary neg pulmonary ROS,  breath sounds clear to auscultation  Pulmonary exam normal       Cardiovascular Exercise Tolerance: Good hypertension, + CAD, + Past MI, + CABG (CABG X 3; 2010), + Peripheral Vascular Disease and DVT Rhythm:regular Rate:Normal     Neuro/Psych Depression TIA Neuromuscular disease CVA    GI/Hepatic Neg liver ROS, hiatal hernia, GERD-  ,(+) Hepatitis -, A  Endo/Other  negative endocrine ROS  Renal/GU negative Renal ROS     Musculoskeletal  (+) Arthritis -, Fibromyalgia -  Abdominal   Peds  Hematology negative hematology ROS (+) Blood dyscrasia, ,   Anesthesia Other Findings Hypertension     Leg DVT (deep venous thromboembolism), chronic        Arthritis     HIV infection        Carotid artery occlusion   40-60% right ICA stenosis (09/2008) Hyperlipidemia        Coronary artery disease   s/p CABG in 2010 Myocardial infarction        Stroke 97 tia Cancer   chll-chronic lymphocitic leukemia    H/O hiatal hernia 08 surgery Diabetes mellitus    Reproductive/Obstetrics negative OB ROS                           Anesthesia Physical Anesthesia Plan  ASA: III  Anesthesia Plan: MAC   Post-op Pain Management:    Induction: Intravenous  Airway Management Planned: Simple Face Mask and Nasal Cannula  Additional Equipment:   Intra-op Plan:   Post-operative Plan:   Informed Consent:   Dental advisory given  Plan  Discussed with: CRNA  Anesthesia Plan Comments:         Anesthesia Quick Evaluation

## 2012-12-28 NOTE — Op Note (Signed)
[facility] [address] [place], [address]   ENDOSCOPY PROCEDURE REPORT  PATIENT: Christopher, [name].  MR#: [id number] BIRTHDATE: July 12, 1950 , 62  yrs. old GENDER: Male ENDOSCOPIST: Beverley Fiedler, MD REFERRED BY: PROCEDURE DATE:  12/28/2012 PROCEDURE:  EGD w/ biopsy ASA CLASS:     Class III INDICATIONS:  Dysphagia.   History of esophageal reflux. MEDICATIONS: MAC sedation, administered by CRNA and See Anesthesia Report. TOPICAL ANESTHETIC: Cetacaine Spray  DESCRIPTION OF PROCEDURE: After the risks benefits and alternatives of the procedure were thoroughly explained, informed consent was obtained.  The Pentax Gastroscope [id number] endoscope was introduced through the mouth and advanced to the second portion of the duodenum. Without limitations.  The instrument was slowly withdrawn as the mucosa was fully examined.   ESOPHAGUS: There was LA Class C esophagitis noted with evidence of esophageal stricture or ring.  The esophagus was otherwise normal.   STOMACH: Moderate erosive gastritis (inflammation) was found in the gastric body and gastric antrum.  There was adherent heme present, without ulceration.  Multiple biopsies were obtained to exclude H. pylori from the gastric body and antrum.  DUODENUM: The duodenal mucosa showed no abnormalities in the bulb and second portion of the duodenum. Retroflexed views revealed no abnormalities.     The scope was then withdrawn from the patient and the procedure completed.  COMPLICATIONS: There were no complications. ENDOSCOPIC IMPRESSION: 1.   There was LA Class C esophagitis noted; which likely explains dysphagia 2.   The esophagus was otherwise normal. 3.   Erosive gastritis (inflammation) was found in the gastric body and gastric antrum; biopsies obtained 4.   The duodenal mucosa showed no abnormalities in the bulb and second portion of the duodenum  RECOMMENDATIONS: 1.  Await pathology results 2.  Recommend  initial of acid suppression medication with PPI, but 1st will discuss with patient's ID physician 3.  Anti-reflux regimen to be followed  eSigned:  Beverley Fiedler, MD 12/28/2012 10:36 AM  CC:The Patient; Staci Righter, MD

## 2012-12-28 NOTE — H&P (View-Only) (Signed)
Patient ID: [name], male   DOB: 08/05/1950, 62 y.o.   MRN: [id number] HPI: [name] is a 62 yo male with PMH of HIV well-controlled on antiretrovirals therapy, CLL, rheumatoid arthritis on infliximab, history of DVT on chronic warfarin therapy, CAD, hypertension, diabetes who is seen in consultation at the request of Dr. [name] to evaluate dysphagia. He reports a history of dysphagia and esophageal stricture. He recalls to previous upper endoscopies for dilation the last approximately 2 years ago in [place], [place]. Prior to this he recalls esophageal dilation in [place], [place] roughly 5 years ago. He specifically recalls being dilated to "17".  This previously significant help his dysphagia symptoms. He reports his dysphagia is particularly worse in the morning though persistent throughout the day. It is more to solid foods but can be with cold liquids as well. Occasionally food feels stuck in his mid chest and this hurts through to his back. He is able to clear his esophagus by taking sips of water. He is having some heartburn and reflux symptoms, but he has not started acid suppressing medication due to possible interaction with his HIV medications. Bowel habits are somewhat fluctuating for him he reports he can have loose or watery stools and then be constipated. When he is constipated he notes having to strain at stool and often feels like there is prolapse from his rectum. He has added he overt his diet which he feels has helped regulate his bowel habits. He recalls a history of colon polyps and his last colonoscopy, roughly 5 years ago. He recalls being told to have followup colonoscopy in 3-5 years.  He is currently on twice daily Lovenox and has been off warfarin for a spinal injection occurring later today  Past Medical History  Diagnosis Date  . DVT, lower extremity, recurrent 2008, 2009    LLE, chronic anticoag since 2009  . Osteoarthritis, knee     s/p B TKA  . HIV  infection dx 1993  . Carotid artery occlusion     40-60% right ICA stenosis (09/2008)  . Hyperlipidemia   . TIA (transient ischemic attack) 1997    mild residual L mouth droop  . H/O hiatal hernia 2008    surgery  . Diabetes mellitus     no med since 08/2011  . CLL (chronic lymphoblastic leukemia) dx 2010    Followed at [facility] q12mo, no current therapy   . Gynecomastia, male   . Impotence of organic origin   . Rheumatoid arthritis(714.0) dx 2010    MTX, follows with rheum  . Gout   . Chronic back pain     follows with Nsurg  . Coronary artery disease 2010    s/p CABG '10, sees Dr. Hochrein  . Hypertension   . Seasonal allergies     hx of  . Bronchitis     hx of  . Myocardial infarction   . Neuromuscular disorder     diabetic neuropathy  . Diverticulosis   . Gallstones   . Fibromyalgia   . Hepatitis A   . Status post dilation of esophageal narrowing     Past Surgical History  Procedure Laterality Date  . Spine surgery  2010    "rod and screws", "failed"  . Cholecystectomy    . Hiatal hernia repair      wrap  . Shoulder surgery Left   . Mandible surgery      tmj  . Varicose vein        stripping  . Replacement total knee Bilateral   . Tonsillectomy    . Knee arthroplasty  07/22/2011    Procedure: COMPUTER ASSISTED TOTAL KNEE ARTHROPLASTY;  Surgeon: Gregory Scott Dean, MD;  Location: [facility] OR;  Service: Orthopedics;  Laterality: Right;  Right total knee arthroplasty  . Coronary artery bypass graft  2010    triple bypass  . Hardware removal N/A 07/02/2012    Procedure: HARDWARE REMOVAL;  Surgeon: Gary P Cram, MD;  Location: [facility];  Service: Neurosurgery;  Laterality: N/A;  . Inguinal hernia repair Bilateral   . Umbilical hernia repair    . Rotator cuff repair Right     Current Outpatient Prescriptions  Medication Sig Dispense Refill  . aspirin EC 81 MG tablet Take 81 mg by mouth daily.      . atazanavir (REYATAZ) 300 MG capsule Take 300 mg by mouth daily with  breakfast.      . atorvastatin (LIPITOR) 10 MG tablet Take 1 tablet (10 mg total) by mouth daily.  30 tablet  6  . B Complex-C (B-COMPLEX WITH VITAMIN C) tablet Take 1 tablet by mouth daily.      . CALCIUM PO Take 1,000 mg by mouth daily.      . cholecalciferol (VITAMIN D) 1000 UNITS tablet Take 1,000 Units by mouth daily. 2 capsules daily      . DULoxetine (CYMBALTA) 30 MG capsule Take 1 capsule (30 mg total) by mouth daily.  30 capsule  3  . enoxaparin (LOVENOX) 80 MG/0.8ML injection Inject 0.8 mLs (80 mg total) into the skin every 12 (twelve) hours.  15 Syringe  0  . EPINEPHrine (EPI-PEN) 0.3 mg/0.3 mL SOAJ Inject 0.3 mg into the muscle once. As needed for allergic reaction      . glucose blood (ONE TOUCH TEST STRIPS) test strip Use as instructed  100 each  5  . glucose blood test strip Use as instructed  100 each  12  . InFLIXimab (REMICADE IV) Inject into the vein.      . lamiVUDine-zidovudine (COMBIVIR) 150-300 MG per tablet TAKE 1 TABLET BY MOUTH TWICE A DAY  60 tablet  6  . metoprolol succinate (TOPROL-XL) 25 MG 24 hr tablet Take 1 tablet (25 mg total) by mouth daily.  30 tablet  6  . pregabalin (LYRICA) 200 MG capsule Take 2 capsules (400 mg total) by mouth 2 (two) times daily.  120 capsule  11  . raltegravir (ISENTRESS) 400 MG tablet Take 1 tablet (400 mg total) by mouth 2 (two) times daily.  60 tablet  3  . ritonavir (NORVIR) 100 MG capsule Take 100 mg by mouth daily.      . tadalafil (CIALIS) 20 MG tablet Take 20 mg by mouth daily as needed for erectile dysfunction.      . valACYclovir (VALTREX) 500 MG tablet Take 500 mg by mouth daily.      . warfarin (COUMADIN) 5 MG tablet Take per instructions from [facility]  60 tablet  2  . MOVIPREP 100 G SOLR Use per prep instruction  1 kit  0   No current facility-administered medications for this visit.    Allergies  Allergen Reactions  . Morphine     REACTION: severe headache  . Other Hives    Pecan  . Oxycodone-Acetaminophen      REACTION: headache  . Peanut-Containing Drug Products   . Penicillins     REACTION: red, flushed  . Promethazine Hcl       REACTION: makes him feel drunk    Family History  Problem Relation Age of Onset  . Breast cancer Mother   . Prostate cancer Father   . Hypertension Mother   . Hyperlipidemia Mother   . Diabetes Mother   . Colon polyps Father   . Crohn's disease Paternal Aunt   . Diabetes Maternal Grandmother   . Diabetes Brother     x 3  . Heart disease Brother     x 3  . Hyperlipidemia Father   . Hyperlipidemia Brother     x 3    History  Substance Use Topics  . Smoking status: Never Smoker   . Smokeless tobacco: Never Used     Comment: occ wine, single - married and divorced x 2; 3 kids  . Alcohol Use: Yes     Comment: occasional wine- 1-2 per week    ROS: As per history of present illness, otherwise negative  BP 100/64  Pulse 72  Ht 5' 7" (1.702 m)  Wt 184 lb 6 oz (83.632 kg)  BMI 28.87 kg/m2 Constitutional: Thin male in no acute distress HEENT: Normocephalic and atraumatic. Oropharynx is clear and moist. No oropharyngeal exudate. Conjunctivae are normal.  No scleral icterus. Neck: Neck supple. Trachea midline. Cardiovascular: Normal rate, regular rhythm and intact distal pulses.  Pulmonary/chest: Effort normal and breath sounds normal. No wheezing, rales or rhonchi. Abdominal: Soft, nontender, nondistended. Bowel sounds active throughout.  Extremities: Changes of rheumatoid arthritis bilateral hands, trace pretibial edema, no cyanosis Neurological: Alert and oriented to person place and time. Psychiatric: Normal mood and affect. Behavior is normal.  CD4 normal Viral load undetectable  RELEVANT LABS AND IMAGING: CBC    Component Value Date/Time   WBC 7.1 11/10/2012 1335   WBC 6.6 08/03/2012 1119   RBC 3.31* 11/10/2012 1335   RBC 3.31* 08/03/2012 1119   HGB 13.1 11/10/2012 1335   HGB 12.9* 08/03/2012 1119   HCT 38.0* 11/10/2012 1335   HCT 36.8*  08/03/2012 1119   PLT 195 11/10/2012 1335   PLT 157 08/03/2012 1119   MCV 114.8* 11/10/2012 1335   MCV 111.2* 08/03/2012 1119   MCH 39.4* 11/10/2012 1335   MCH 39.0* 08/03/2012 1119   MCHC 34.3 11/10/2012 1335   MCHC 35.1 08/03/2012 1119   RDW 16.4* 11/10/2012 1335   RDW 16.2* 08/03/2012 1119   LYMPHSABS 4.1* 11/10/2012 1335   LYMPHSABS 4.4* 08/03/2012 1119   MONOABS 0.1 11/10/2012 1335   MONOABS 0.6 08/03/2012 1119   EOSABS 0.0 11/10/2012 1335   EOSABS 0.1 08/03/2012 1119   BASOSABS 0.0 11/10/2012 1335   BASOSABS 0.0 08/03/2012 1119    CMP     Component Value Date/Time   NA 138 11/10/2012 1335   NA 140 08/03/2012 1119   K 4.3 11/10/2012 1335   K 4.2 08/03/2012 1119   CL 104 08/03/2012 1119   CO2 25 11/10/2012 1335   CO2 28 08/03/2012 1119   GLUCOSE 131 11/10/2012 1335   GLUCOSE 68* 08/03/2012 1119   BUN 15.1 11/10/2012 1335   BUN 21 08/03/2012 1119   CREATININE 0.9 11/10/2012 1335   CREATININE 0.88 08/03/2012 1119   CREATININE 0.72 07/02/2012 1757   CALCIUM 8.5 11/10/2012 1335   CALCIUM 8.8 08/03/2012 1119   PROT 6.5 11/10/2012 1335   PROT 5.6* 08/03/2012 1119   ALBUMIN 3.6 11/10/2012 1335   ALBUMIN 4.1 08/03/2012 1119   AST 44* 11/10/2012 1335   AST 33 08/03/2012 1119   ALT 36 11/10/2012   1335   ALT 28 08/03/2012 1119   ALKPHOS 97 11/10/2012 1335   ALKPHOS 89 08/03/2012 1119   BILITOT 2.83* 11/10/2012 1335   BILITOT 3.5* 08/03/2012 1119   GFRNONAA >90 07/02/2012 1757   GFRAA >90 07/02/2012 1757    ASSESSMENT/PLAN: 62 yo male with PMH of HIV well-controlled on antiretrovirals therapy, CLL, rheumatoid arthritis on infliximab, history of DVT on chronic warfarin therapy, CAD, hypertension, diabetes who is seen in consultation at the request of Dr. [name] to evaluate dysphagia.  1.  Dysphagia/hx of esophageal stricture -- he has had several upper endoscopies for dilation in the past and I have requested these records today. Based on his symptoms, upper endoscopy is felt warranted, and after discussion of the risks and benefits he  is agreeable to proceed. He may need acid suppression medication based on findings at endoscopy and if this is the case, I will discuss acid suppression medication with his infectious disease doctor before starting it as to not interfere with the absorption of his HIV meds.  He will need to be off of warfarin therapy for the upper endoscopy, and given that he is already off we will plan for EGD next Tuesday.  He can continue Lovenox until 24 hours before his procedure. We will contact Cindy, his anticoagulation clinic nurse, to help with bridging instructions.  2.  Hx of colon polyps -- he reports he is due for screening colonoscopy, and wishes to proceed. Again his warfarin will need to be held as discussed in #1. We discussed colonoscopy including the risks and benefits and he is agreeable to proceed. Both procedures will be performed with monitored anesthesia care. We have requested records of previous colonoscopy and polypectomy from [place]. He also reports possible prolapse and this can be evaluated at the time of his colonoscopy. I have asked that he start daily Benefiber as well as a probiotic to try to help regulate his bowel movements.. He was given samples of Restora today.    3.  HIV -- stable with normal CD4 count on antiretroviral therapy  4.  CLL -- also stable and indolent being followed by oncology at [facility] and in [place]  5.  DVT -- not an active issue but on anticoagulation as discussed above     

## 2012-12-28 NOTE — Anesthesia Postprocedure Evaluation (Signed)
Anesthesia Post Note  Patient: RUPERT AZZARA  Procedure(s) Performed: Procedure(s) (LRB): ESOPHAGOGASTRODUODENOSCOPY (EGD) WITH PROPOFOL (N/A) COLONOSCOPY WITH PROPOFOL (N/A)  Anesthesia type: MAC  Patient location: PACU  Post pain: Pain level controlled  Post assessment: Post-op Vital signs reviewed  Last Vitals:  Filed Vitals:   12/28/12 1100  BP: 144/85  Pulse:   Temp:   Resp: 48    Post vital signs: Reviewed  Level of consciousness: sedated  Complications: No apparent anesthesia complications

## 2012-12-28 NOTE — Transfer of Care (Signed)
Immediate Anesthesia Transfer of Care Note  Patient: [name]  Procedure(s) Performed: Procedure(s): ESOPHAGOGASTRODUODENOSCOPY (EGD) WITH PROPOFOL (N/A) COLONOSCOPY WITH PROPOFOL (N/A)  Patient Location: PACU and Endoscopy Unit  Anesthesia Type:MAC  Level of Consciousness: awake and alert   Airway & Oxygen Therapy: Patient Spontanous Breathing and Patient connected to nasal cannula oxygen  Post-op Assessment: Report given to PACU RN and Post -op Vital signs reviewed and stable  Post vital signs: Reviewed and stable  Complications: No apparent anesthesia complications

## 2012-12-29 ENCOUNTER — Telehealth: Payer: Self-pay | Admitting: Internal Medicine

## 2012-12-29 ENCOUNTER — Telehealth: Payer: Self-pay | Admitting: *Deleted

## 2012-12-29 ENCOUNTER — Other Ambulatory Visit: Payer: Self-pay | Admitting: Internal Medicine

## 2012-12-29 ENCOUNTER — Encounter (HOSPITAL_COMMUNITY): Payer: Self-pay | Admitting: Internal Medicine

## 2012-12-29 MED ORDER — DARUNAVIR ETHANOLATE 800 MG PO TABS: 800.0000 mg | ORAL_TABLET | Freq: Every day | ORAL | Status: DC

## 2012-12-29 NOTE — Telephone Encounter (Signed)
Maybe recheck the CD4 and viral load in about 2-3 weeks as well to be sure.  Thanks

## 2012-12-29 NOTE — Telephone Encounter (Signed)
Message copied by Andree Coss on Wed Dec 29, 2012  3:07 PM ------      Message from: Gardiner Barefoot      Created: Wed Dec 29, 2012  2:31 PM       Sorry, I can't get the "triage pool" to work.  He is in need of a ppi and so I want to switch him to Prezista and stop his Reyataz so he can take the acid blocker medicine Dr. Rhea Belton is about to prescribe.  He should just start Prezista as soon as he gets it.        So he should take Prezista 80 mg daily with norvir 100 mg daily, Isentress 400 mg twice a day and Combivir twice a day.       I will send the prescription now for my part.        Thanks ------

## 2012-12-29 NOTE — Progress Notes (Signed)
Vonna Kotyk,      Thanks for the note.  Yes, ppis and H2 blockers do block the absorption of atazanavir.  You can separate them by 12 hours but obviously not for bid. If bid, I think the easiest will be to just switch the atazanavir to darunavir (Prezista) which does not have that limitation and works just the same for his HIV.  I will let him know now of the switch and you can start him anytime on the ppi.    Thanks,  Rob [employer]

## 2012-12-29 NOTE — Telephone Encounter (Signed)
Error

## 2012-12-29 NOTE — Telephone Encounter (Signed)
Notified patient's pharmacy, confirmed new regimen.  All future Reyataz refills have been cancelled. Patient just refilled this 12/15/12. Pt can pick up Prezista in about 1 hour.  Left message on patient's cell to notify him he has a new regimen d/t the changes Dr. Rhea Belton has made. Asked the patient to call us to discuss his new regimen. Andree Coss, RN

## 2012-12-30 ENCOUNTER — Encounter: Payer: Self-pay | Admitting: Internal Medicine

## 2012-12-30 ENCOUNTER — Ambulatory Visit (INDEPENDENT_AMBULATORY_CARE_PROVIDER_SITE_OTHER): Payer: Medicare Other | Admitting: Internal Medicine

## 2012-12-30 VITALS — BP 112/70 | HR 66 | Temp 97.5°F | Wt 180.0 lb

## 2012-12-30 DIAGNOSIS — B20 Human immunodeficiency virus [HIV] disease: Secondary | ICD-10-CM

## 2012-12-30 DIAGNOSIS — F32A Depression, unspecified: Secondary | ICD-10-CM

## 2012-12-30 DIAGNOSIS — F329 Major depressive disorder, single episode, unspecified: Secondary | ICD-10-CM

## 2012-12-30 DIAGNOSIS — M069 Rheumatoid arthritis, unspecified: Secondary | ICD-10-CM | POA: Diagnosis not present

## 2012-12-30 DIAGNOSIS — F3289 Other specified depressive episodes: Secondary | ICD-10-CM

## 2012-12-30 DIAGNOSIS — G8929 Other chronic pain: Secondary | ICD-10-CM

## 2012-12-30 DIAGNOSIS — M549 Dorsalgia, unspecified: Secondary | ICD-10-CM | POA: Diagnosis not present

## 2012-12-30 NOTE — Patient Instructions (Signed)
It was good to see you today. We have reviewed your prior records including labs and tests today Medications reviewed and updated, no changes recommended at this time. Continue working with your other specialists as ongoing Please schedule followup in 3-4 months, call sooner if problems.

## 2012-12-30 NOTE — Progress Notes (Signed)
Subjective:    Patient ID: [name], male    DOB: [birth year], 62 y.o.   MRN: [id number]  HPI  Here for follow up -reviewed interval issues and chronic medical issues:   Past Medical History  Diagnosis Date  . DVT, lower extremity, recurrent 2008, 2009    LLE, chronic anticoag since 2009  . Osteoarthritis, knee     s/p B TKA  . HIV infection dx 1993  . Carotid artery occlusion     40-60% right ICA stenosis (09/2008)  . Hyperlipidemia   . TIA (transient ischemic attack) 1997    mild residual L mouth droop  . H/O hiatal hernia 2008    surgery  . Diabetes mellitus     no med since 08/2011  . CLL (chronic lymphoblastic leukemia) dx 2010    Followed at [facility] q38mo, no current therapy   . Gynecomastia, male   . Impotence of organic origin   . Rheumatoid arthritis(714.0) dx 2010    MTX, follows with rheum  . Gout   . Chronic back pain     follows with Nsurg  . Coronary artery disease 2010    s/p CABG '10, sees Dr. Antoine Poche  . Hypertension   . Seasonal allergies   . Myocardial infarction   . Neuromuscular disorder     neuropathy  . Diverticulosis   . Gallstones   . Fibromyalgia   . Hepatitis A   . Status post dilation of esophageal narrowing   . GERD (gastroesophageal reflux disease)     Review of Systems  Constitutional: Positive for activity change and fatigue. Negative for fever, chills, appetite change and unexpected weight change.  Eyes: Negative for visual disturbance.  Respiratory: Negative for cough, shortness of breath and wheezing. Choking: trouble swallowing > 2 years    Cardiovascular: Negative for chest pain, palpitations and leg swelling.  Gastrointestinal: Negative for nausea, abdominal pain, diarrhea, constipation and abdominal distention.  Musculoskeletal: Positive for myalgias, back pain, joint swelling, arthralgias and gait problem.  Allergic/Immunologic: Positive for immunocompromised state.  Neurological: Positive for tremors (R hand, with  ADLs) and weakness (generalized - BLE and hands). Negative for dizziness, seizures, facial asymmetry, speech difficulty, numbness and headaches.  Psychiatric/Behavioral: Negative for suicidal ideas and dysphoric mood. The patient is not nervous/anxious.        Objective:   Physical Exam  BP 112/70  Pulse 66  Temp(Src) 97.5 F (36.4 C) (Oral)  Wt 180 lb (81.647 kg)  BMI 28.19 kg/m2  SpO2 95% Wt Readings from Last 3 Encounters:  12/30/12 180 lb (81.647 kg)  12/27/12 180 lb (81.647 kg)  12/27/12 180 lb (81.647 kg)   Constitutional:  He is frail, but appears well-developed and well-nourished. No distress.  Neck: Normal range of motion. Neck supple. No JVD or LAD present. No thyromegaly present.  Cardiovascular: Normal rate, regular rhythm and normal heart sounds.  No murmur heard. no BLE edema Pulmonary/Chest: Effort normal and breath sounds normal. No respiratory distress. no wheezes. Musculoskeletal: spine deformity with prominence of lumbar rod at lower T level and "bony tumor" mid T spine. tender to direct palpation - gait is forward bent (90 degrees+) at hips, aided with cane. B knees s/p TKR, no effusion - B hands with MCP radial deviation. no synovitis or effusions.  Skin: very tan - Skin is warm and dry.  No erythema or ulceration.  Psychiatric: he has a brighter mood and affect. behavior is normal. Judgment and thought content normal.   Lab  Results  Component Value Date   WBC 7.1 11/10/2012   HGB 13.1 11/10/2012   HCT 38.0* 11/10/2012   PLT 195 11/10/2012   GLUCOSE 131 11/10/2012   CHOL 120 08/03/2012   TRIG 106 08/03/2012   HDL 31* 08/03/2012   LDLCALC 68 08/03/2012   ALT 36 11/10/2012   AST 44* 11/10/2012   NA 138 11/10/2012   K 4.3 11/10/2012   CL 104 08/03/2012   CREATININE 0.9 11/10/2012   BUN 15.1 11/10/2012   CO2 25 11/10/2012   TSH 1.04 06/22/2012   INR 1.1 12/23/2012   HGBA1C 4.9 06/22/2012        Assessment & Plan:    See problem list. Medications and labs reviewed today.

## 2012-12-30 NOTE — Assessment & Plan Note (Signed)
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  note peripheral neuropathy hands and feet due to meds - on Lyrica for same Change in antiviral rx'd 12/2012 due to need for PPI to treat gastritis - ID following same

## 2012-12-30 NOTE — Assessment & Plan Note (Signed)
Increasing symptoms - previously on prozac, then sertraline until 2007 Selected cymbalta 11/2012, also for help with chronic pain syndrome Doing well, much improved The current medical regimen is effective;  continue present plan and medications.

## 2012-12-30 NOTE — Assessment & Plan Note (Signed)
Following with neurosurgery and physiatry for same Reviewed interval events, status post recent ejection left-sided lower back.  Okay for back brace as needed for support to control stable his pain 

## 2012-12-30 NOTE — Assessment & Plan Note (Signed)
Dx 2010 - previously followed with rheum in [place] but transfered to Dr Dareen Piano spring 2014 Prev on MTX, then trial Remicade late 07/2012-08/2012 - stopped because of severe myalgias, arthralgias and increase in AST/ALT intermittet low-dose prednisone between followup with rheumatology Complicated by gout and osteoporosis - med mgmt reviewed The current medical regimen is effective;  continue present plan and medications.

## 2012-12-31 ENCOUNTER — Telehealth: Payer: Self-pay | Admitting: General Practice

## 2012-12-31 ENCOUNTER — Other Ambulatory Visit: Payer: Self-pay | Admitting: General Practice

## 2012-12-31 ENCOUNTER — Telehealth: Payer: Self-pay | Admitting: *Deleted

## 2012-12-31 MED ORDER — PANTOPRAZOLE SODIUM 40 MG PO TBEC: DELAYED_RELEASE_TABLET | ORAL | Status: DC

## 2012-12-31 NOTE — Telephone Encounter (Signed)
Informed pt of path results and we are mailing him a letter explaining the path; recall in for 5 years. Pt reports Dr Luciana Axe call him yesterday with the new HIV med and he is to start it and have a CD4 drawn in 3 weeks, then he may start the Pantoprazole. F/u with Dr Rhea Belton scheduled for 02/03/13. Explained to I am not sure if Dr Rhea Belton still wants the appt at that time d/t the delay in beginning the PPI; will check and let him know if we need to change it; pt stated understanding. Dr Rhea Belton, is 02/03/13 appt good? Thanks.

## 2012-12-31 NOTE — Telephone Encounter (Signed)
Message copied by Florene Glen on Fri Dec 31, 2012  9:18 AM ------      Message from: Beverley Fiedler      Created: Thu Dec 30, 2012  1:23 PM       I spoke to Dr. Luciana Axe who is going to switch one of his HIV meds to allow for PPI administration.      Please start patient on pantoprazole 40 mg twice daily x 8 weeks, then daily thereafter for reflux esophagitis.      Gastric bx negative for H pylori      Office followup in 4-6 weeks      Thanks       ------

## 2012-12-31 NOTE — Telephone Encounter (Signed)
Pt will call me back after her calls Dr Luciana Axe to verify the starting time of his new HIV drug.

## 2012-12-31 NOTE — Telephone Encounter (Signed)
Spoke with patient and instructed him to take 15 mg of coumadin today and tomorrow and then take 10 mg over the weekend.  Instructed patient to call Monday with the INR.  Patient verbalized understanding.

## 2012-12-31 NOTE — Telephone Encounter (Signed)
Ideally GI followup appt, 4 weeks after starting PPI

## 2013-01-03 ENCOUNTER — Ambulatory Visit (INDEPENDENT_AMBULATORY_CARE_PROVIDER_SITE_OTHER): Payer: Medicare Other | Admitting: General Practice

## 2013-01-03 ENCOUNTER — Telehealth: Payer: Self-pay | Admitting: Internal Medicine

## 2013-01-03 ENCOUNTER — Other Ambulatory Visit: Payer: Self-pay | Admitting: Gastroenterology

## 2013-01-03 DIAGNOSIS — Z7901 Long term (current) use of anticoagulants: Secondary | ICD-10-CM

## 2013-01-03 LAB — POCT INR: INR: 2.2

## 2013-01-03 NOTE — Telephone Encounter (Signed)
Started prior auth for pantoprazole 

## 2013-01-03 NOTE — Progress Notes (Signed)
I would probably change to Prezista.  In this case the need for acid suppression is clear, and I would prefer to avoid the complicated dosing strategies that we would have to use with either PPI or H2 and Atazanavir.  He should be sure to take his Prezista with food as well.

## 2013-01-04 ENCOUNTER — Telehealth: Payer: Self-pay | Admitting: *Deleted

## 2013-01-04 NOTE — Telephone Encounter (Signed)
Pt reports he has not called Dr Luciana Axe because his insurance will not pay for the drug. Adonis Housekeeper, CMA is working on the Prior Auth and informed pt when he gets the med approved, let me know what Dr Luciana Axe says about when he can start pantoprazole.

## 2013-01-04 NOTE — Telephone Encounter (Signed)
Called pt yesterday left msg for him to RTC. Called pt again still no answer LMOM RTC ASAP concerning back brace...lmb

## 2013-01-04 NOTE — Telephone Encounter (Signed)
[name] has been looking into this for me, specifically how to order or "what" order is needed Will route to her for follow up on this

## 2013-01-04 NOTE — Telephone Encounter (Signed)
Pt called states he left a picture of a back brace for Dr Felicity Coyer to review, he is requesting whether he needs a Rx for the brace or how can he acquire the brace.  Please advise

## 2013-01-05 ENCOUNTER — Other Ambulatory Visit: Payer: Self-pay | Admitting: *Deleted

## 2013-01-05 NOTE — Telephone Encounter (Signed)
Pt return call back inform him md wasn't sure how to order the brace, and pt wasn't either. Advise him to contact the [employer] company to see can he order & if he need form completed or rx md did states she would fill out. Inform him they can contact md if needed...lmb

## 2013-01-06 DIAGNOSIS — M069 Rheumatoid arthritis, unspecified: Secondary | ICD-10-CM | POA: Diagnosis not present

## 2013-01-11 DIAGNOSIS — R413 Other amnesia: Secondary | ICD-10-CM | POA: Diagnosis not present

## 2013-01-12 ENCOUNTER — Other Ambulatory Visit: Payer: Self-pay | Admitting: Internal Medicine

## 2013-01-17 ENCOUNTER — Telehealth: Payer: Self-pay | Admitting: *Deleted

## 2013-01-17 DIAGNOSIS — G8929 Other chronic pain: Secondary | ICD-10-CM

## 2013-01-17 NOTE — Telephone Encounter (Signed)
Rosanne Ashing called back he states pt need rx for back brace name BOA Dual Tlso. Also medicare requires clinical notes for reason & will need demographic all fax to [phone]...lmb

## 2013-01-17 NOTE — Telephone Encounter (Signed)
Left msg on triage requesting to speak with [name]. Called [name] back no answer LMOM RTC...lmb

## 2013-01-17 NOTE — Telephone Encounter (Signed)
Generated rx for back brace. Faxed info to fax # that was given...Raechel Chute

## 2013-01-20 ENCOUNTER — Ambulatory Visit: Payer: Medicare Other

## 2013-01-20 DIAGNOSIS — R413 Other amnesia: Secondary | ICD-10-CM

## 2013-01-21 NOTE — Telephone Encounter (Signed)
See telephone encounter.

## 2013-01-26 NOTE — Telephone Encounter (Signed)
Spoke with pt and he had labs done and has been on on PPI long enough per Dr Lauro Franklin instructions. He will keep his appt next week with Dr Rhea Belton.[name]

## 2013-02-01 DIAGNOSIS — B351 Tinea unguium: Secondary | ICD-10-CM | POA: Diagnosis not present

## 2013-02-01 DIAGNOSIS — L851 Acquired keratosis [keratoderma] palmaris et plantaris: Secondary | ICD-10-CM | POA: Diagnosis not present

## 2013-02-01 DIAGNOSIS — E1149 Type 2 diabetes mellitus with other diabetic neurological complication: Secondary | ICD-10-CM | POA: Diagnosis not present

## 2013-02-02 ENCOUNTER — Encounter: Payer: Self-pay | Admitting: Internal Medicine

## 2013-02-03 ENCOUNTER — Encounter: Payer: Self-pay | Admitting: Internal Medicine

## 2013-02-03 ENCOUNTER — Ambulatory Visit (INDEPENDENT_AMBULATORY_CARE_PROVIDER_SITE_OTHER): Payer: Medicare Other | Admitting: Internal Medicine

## 2013-02-03 ENCOUNTER — Other Ambulatory Visit: Payer: Self-pay | Admitting: Gastroenterology

## 2013-02-03 ENCOUNTER — Other Ambulatory Visit: Payer: Medicare Other

## 2013-02-03 ENCOUNTER — Telehealth: Payer: Self-pay | Admitting: Gastroenterology

## 2013-02-03 VITALS — BP 94/50 | HR 72 | Ht 67.0 in | Wt 186.8 lb

## 2013-02-03 DIAGNOSIS — D126 Benign neoplasm of colon, unspecified: Secondary | ICD-10-CM | POA: Diagnosis not present

## 2013-02-03 DIAGNOSIS — R109 Unspecified abdominal pain: Secondary | ICD-10-CM

## 2013-02-03 DIAGNOSIS — B20 Human immunodeficiency virus [HIV] disease: Secondary | ICD-10-CM | POA: Diagnosis not present

## 2013-02-03 DIAGNOSIS — R1310 Dysphagia, unspecified: Secondary | ICD-10-CM

## 2013-02-03 DIAGNOSIS — K209 Esophagitis, unspecified without bleeding: Secondary | ICD-10-CM | POA: Diagnosis not present

## 2013-02-03 LAB — COMPLETE METABOLIC PANEL WITH GFR
ALT: 26 U/L (ref 0–53)
AST: 29 U/L (ref 0–37)
Albumin: 4 g/dL (ref 3.5–5.2)
Alkaline Phosphatase: 79 U/L (ref 39–117)
BUN: 17 mg/dL (ref 6–23)
CO2: 31 mEq/L (ref 19–32)
Calcium: 8.8 mg/dL (ref 8.4–10.5)
Chloride: 103 mEq/L (ref 96–112)
Creat: 0.85 mg/dL (ref 0.50–1.35)
GFR, Est African American: >89 mL/min
GFR, Est Non African American: >89 mL/min
Glucose, Bld: 75 mg/dL (ref 70–99)
Potassium: 4.4 mEq/L (ref 3.5–5.3)
Sodium: 139 mEq/L (ref 135–145)
Total Bilirubin: 0.8 mg/dL (ref 0.3–1.2)
Total Protein: 6.1 g/dL (ref 6.0–8.3)

## 2013-02-03 MED ORDER — PANTOPRAZOLE SODIUM 40 MG PO TBEC: DELAYED_RELEASE_TABLET | ORAL | Status: DC

## 2013-02-03 NOTE — Telephone Encounter (Signed)
02/03/2013    RE: [name] DOB: 04/23/1951 MRN: [id number]   Dear Dr. Felicity Coyer,    We have scheduled the above patient for an endoscopic procedure. Our records show that he is on anticoagulation therapy.   Please advise as to how long the patient may come off his therapy of Coumadin prior to the procedure, which is scheduled for 03/15/2013.  Please fax back/ or route the completed form to [facility] at [phone].   Sincerely,  Adonis Housekeeper [facility] Dr. Carie Caddy. Pyrtle

## 2013-02-03 NOTE — Patient Instructions (Addendum)
You have been scheduled for an endoscopy with propofol. Please follow written instructions given to you at your visit today. If you use inhalers (even only as needed), please bring them with you on the day of your procedure. Your physician has requested that you go to www.startemmi.com and enter the access code given to you at your visit today. This web site gives a general overview about your procedure. However, you should still follow specific instructions given to you by our office regarding your preparation for the procedure.   Take protonix 40 mg twice a day until after your procedure.   Please call us if your right flank pain is getting worse

## 2013-02-03 NOTE — Telephone Encounter (Signed)
Ok to bridge for colonoscopy plans - will forward to our anticoagulation coordinator Bailey Mech, RN, to arrange same with the patient

## 2013-02-03 NOTE — Progress Notes (Signed)
Subjective:    Patient ID: [name], male    DOB: 06/25/1950, 62 y.o.   MRN: [id number]  HPI [name] is a 62 yo male with PMH of HIV well-controlled on antiretrovirals therapy, CLL, rheumatoid arthritis on infliximab, history of DVT on chronic warfarin therapy, CAD, hypertension, diabetes who is seen in followup. He was initially seen on 12/23/2012 to evaluate dysphagia. Upper endoscopy was performed on 12/28/2012 which revealed LA class C esophagitis, erosive gastritis and a normal examined duodenum. Biopsies showed gastric body and antral type mucosa with mild chronic inflammation but no H. pylori, metaplasia or dysplasia. Colonoscopy performed on the same day revealed a 5 mm transverse colon adenoma which was removed with cold snare, moderate diverticulosis in the left colon and internal hemorrhoids.  Twice daily pantoprazole was recommended after his procedure, but this required a change to his HIV medications so as not to interfere with drug efficacy.  The change was made on advisement from his ID physician, Dr. Luciana Axe.  Once this changes made he began pantoprazole 40 mg once daily. It was intended for twice daily, though the prescription noted once daily and so he is only been taking it once daily. He reports overall his swallowing is better, but he still has intermittent dysphagia. He reports feeling as if food sticks in his lower esophagus and sits there until it eventually releases. He's not having regurgitation. This tends to be worse in the morning and find that his evening meal. When the food sticks it does cause burning pain which can radiate to his back. He denies chest pain or heartburn outside of these isolated episodes. Bowel movements have been slightly looser since starting pantoprazole but no rectal bleeding or melena. This morning he noted right flank pain which seems to be positional. He has been wearing a new orthopedic brace over last week and he wonders if this pain relates to  the brace. The pain does not radiate and does not seem to worsen with eating or bowel movement. He has a history of kidney stones remotely. He denies urinary symptoms including dysuria, hematuria or frequency.   Review of Systems  as per history of present illness, otherwise negative   Current Medications, Allergies, Past Medical History, Past Surgical History, Family History and Social History were reviewed in [employer] record.      Objective:   Physical Exam BP 94/50  Pulse 72  Ht 5\' 7"  (1.702 m)  Wt 186 lb 12.8 oz (84.732 kg)  BMI 29.25 kg/m2 Constitutional: Well-developed and well-nourished. No distress. HEENT: Normocephalic and atraumatic. No scleral icterus. Neck: Neck supple. Trachea midline. Cardiovascular: Normal rate, regular rhythm and intact distal pulses. Pulmonary/chest: Effort normal and breath sounds normal. No wheezing, rales or rhonchi. Abdominal: Soft, nontender anteriorly, there is there is tenderness to deep palpation in the right flank inferior to the rib cage without masses, there is no skin rash in this area, abdomen is nondistended, bowel sounds active throughout. Extremities: no clubbing, cyanosis, or edema Lymphadenopathy: No cervical adenopathy noted. Neurological: Alert and oriented to person place and time. Skin: Skin is warm and dry. No rashes noted. Psychiatric: Normal mood and affect. Behavior is normal.      Assessment & Plan:  62 yo male with PMH of HIV well-controlled on antiretrovirals therapy, CLL, rheumatoid arthritis on infliximab, history of DVT on chronic warfarin therapy, CAD, hypertension, diabetes who is seen in followup  1.  Erosive esophagitis/esophageal dysphagia -- unfortunately he is only done  once daily pantoprazole, and I am increasing this to twice daily for at least a month. With this persistent dysphagia it is possible that he has ongoing reflux esophagitis, as there was no definite stricture seen at the  time of endoscopy (the body of the endoscopy note is confusing, but should read "there was LA class C. esophagitis noted withOUT evidence of esophageal stricture or ring".  Given his persistent symptoms I would like to schedule him for a repeat upper endoscopy to make sure the esophagitis has healed. If it has I would consider empiric dilation of the GE junction. We discussed this today including the risks and benefits and he is agreeable to proceed. His warfarin will need to be held, and we'll seek permission from his prescribing physician. He may require Lovenox bridging.  He will continue twice-daily PPI until this procedure  2.  Right flank pain -- this could be musculoskeletal in nature. He is having labs today at his ID physician's office including CBC and CMP. We considered CT scan today of the abdomen and pelvis but he would like to wait. He agrees to call me immediately should the pain persist or worsen.  3.  Adenomatous colon polyp -- he is aware of the recommended surveillance interval of 5 years for repeat colonoscopy

## 2013-02-04 ENCOUNTER — Telehealth: Payer: Self-pay | Admitting: Neurology

## 2013-02-04 LAB — CBC WITH DIFFERENTIAL/PLATELET
Basophils Absolute: 0 10*3/uL (ref 0.0–0.1)
Basophils Relative: 0 % (ref 0–1)
Eosinophils Absolute: 0.1 10*3/uL (ref 0.0–0.7)
Eosinophils Relative: 1 % (ref 0–5)
HCT: 38.7 % — ABNORMAL LOW (ref 39.0–52.0)
Hemoglobin: 13.7 g/dL (ref 13.0–17.0)
Lymphocytes Relative: 62 % — ABNORMAL HIGH (ref 12–46)
Lymphs Abs: 6 10*3/uL — ABNORMAL HIGH (ref 0.7–4.0)
MCH: 40.3 pg — ABNORMAL HIGH (ref 26.0–34.0)
MCHC: 35.4 g/dL (ref 30.0–36.0)
MCV: 113.8 fL — ABNORMAL HIGH (ref 78.0–100.0)
Monocytes Absolute: 1.4 10*3/uL — ABNORMAL HIGH (ref 0.1–1.0)
Monocytes Relative: 15 % — ABNORMAL HIGH (ref 3–12)
Neutro Abs: 2.1 10*3/uL (ref 1.7–7.7)
Neutrophils Relative %: 22 % — ABNORMAL LOW (ref 43–77)
Platelets: 156 10*3/uL (ref 150–400)
RBC: 3.4 MIL/uL — ABNORMAL LOW (ref 4.22–5.81)
RDW: 14.9 % (ref 11.5–15.5)
WBC: 9.7 10*3/uL (ref 4.0–10.5)

## 2013-02-04 LAB — PATHOLOGIST SMEAR REVIEW

## 2013-02-04 LAB — T-HELPER CELL (CD4) - (RCID CLINIC ONLY)
CD4 % Helper T Cell: 15 % — ABNORMAL LOW (ref 33–55)
CD4 T Cell Abs: 910 /uL (ref 400–2700)

## 2013-02-04 LAB — HIV-1 RNA QUANT-NO REFLEX-BLD
HIV 1 RNA Quant: 63 copies/mL — ABNORMAL HIGH (ref <20)
HIV-1 RNA Quant, Log: 1.8 {Log} — ABNORMAL HIGH (ref <1.30)

## 2013-02-04 NOTE — Telephone Encounter (Signed)
Spoke with the patient. Information given as per Dr. Everlena Cooper. The patient voiced concern over the way the test was conducted and about how some of the answers were obtained. He did voice his concerns to Dr. Leonides Cave. He was tof/u in 6 months with Dr. Everlena Cooper but did not wish to wait that long. Requested an appointment in October and so I scheduled a follow up on 03/10/13. He is ok with this. The patient still feels like there is something wrong with his memory.

## 2013-02-04 NOTE — Telephone Encounter (Signed)
Message copied by Benay Spice on Fri Feb 04, 2013  2:15 PM ------      Message from: JAFFE, ADAM R      Created: Fri Feb 04, 2013  8:57 AM       Mr Burgner's neuropsychological testing did not reveal any clear evidence of cognitive impairment.  I wouldn't change anything, such as in terms of management, now.  But I would like to schedule follow up with me in 6 months for re-evaluation. ------

## 2013-02-04 NOTE — Telephone Encounter (Signed)
Left a message for the patient to return my call.  

## 2013-02-11 DIAGNOSIS — Z23 Encounter for immunization: Secondary | ICD-10-CM | POA: Diagnosis not present

## 2013-02-17 ENCOUNTER — Ambulatory Visit (INDEPENDENT_AMBULATORY_CARE_PROVIDER_SITE_OTHER): Payer: Medicare Other | Admitting: General Practice

## 2013-02-17 DIAGNOSIS — Z7901 Long term (current) use of anticoagulants: Secondary | ICD-10-CM

## 2013-02-17 LAB — POCT INR: INR: 4.5

## 2013-02-22 ENCOUNTER — Encounter (HOSPITAL_COMMUNITY): Payer: Self-pay | Admitting: Pharmacy Technician

## 2013-02-24 DIAGNOSIS — M069 Rheumatoid arthritis, unspecified: Secondary | ICD-10-CM | POA: Diagnosis not present

## 2013-02-25 ENCOUNTER — Telehealth: Payer: Self-pay | Admitting: General Practice

## 2013-02-25 ENCOUNTER — Other Ambulatory Visit: Payer: Self-pay | Admitting: General Practice

## 2013-02-25 MED ORDER — ENOXAPARIN SODIUM 80 MG/0.8ML ~~LOC~~ SOLN: 80.0000 mg | Freq: Two times a day (BID) | SUBCUTANEOUS | Status: DC

## 2013-02-25 NOTE — Telephone Encounter (Signed)
Instructions for coumadin and Lovenox pre and post procedure 10/30 - Last dose of coumadin 10/31 - Nothing 11/1 - Lovenox AM and PM 11/2 - Lovenox AM and PM 11/3 - Lovenox in AM only 11/4 - procedure (Nothing) 11/5 - Lovenox in AM and PM and 15 mg coumadin 11/6 - Lovenox in AM and PM and 15 mg coumadin 11/7 - Lovenox in AM and PM and 10 mg of coumadin 11/8 - Lovenox in AM and PM and 10 mg of coumadin 11/9 - Lovenox in AM and PM and 10 mg of coumadin 11/10 - Re-check INR  Patient verbalized understanding.

## 2013-02-25 NOTE — Telephone Encounter (Signed)
Instructions for coumadin and Lovenox pre and post procedure. 10/30 - last dose of coumadin 10/31 - nothing 11/1 - Lovenox AM and PM 11/2 - Lovenox AM and PM 11/3 - Lovenox in AM only 11/4 - Procedure (Do not take anything) 11/5 - Lovenox in AM and PM and 15 mg coumadin 11/6 - Lovenox in AM and PM and 15 mg coumadin 11/7 - Lovenox in AM and PM and 10 mg coumadin 11/8 - Lovenox in AM and PM and 10 mg coumadin 11/9 - Lovenox in AM and PM and 10 mg coumadin 11/10 - Check INR

## 2013-03-02 ENCOUNTER — Encounter (HOSPITAL_COMMUNITY): Payer: Self-pay | Admitting: *Deleted

## 2013-03-03 ENCOUNTER — Encounter: Payer: Self-pay | Admitting: Internal Medicine

## 2013-03-03 ENCOUNTER — Ambulatory Visit (INDEPENDENT_AMBULATORY_CARE_PROVIDER_SITE_OTHER): Payer: Medicare Other | Admitting: Internal Medicine

## 2013-03-03 ENCOUNTER — Ambulatory Visit: Payer: Medicare Other | Admitting: Neurology

## 2013-03-03 VITALS — BP 130/72 | HR 62 | Temp 98.2°F | Wt 188.1 lb

## 2013-03-03 DIAGNOSIS — F329 Major depressive disorder, single episode, unspecified: Secondary | ICD-10-CM | POA: Diagnosis not present

## 2013-03-03 DIAGNOSIS — I803 Phlebitis and thrombophlebitis of lower extremities, unspecified: Secondary | ICD-10-CM | POA: Diagnosis not present

## 2013-03-03 DIAGNOSIS — F3289 Other specified depressive episodes: Secondary | ICD-10-CM | POA: Diagnosis not present

## 2013-03-03 DIAGNOSIS — D239 Other benign neoplasm of skin, unspecified: Secondary | ICD-10-CM

## 2013-03-03 DIAGNOSIS — F32A Depression, unspecified: Secondary | ICD-10-CM

## 2013-03-03 DIAGNOSIS — R1310 Dysphagia, unspecified: Secondary | ICD-10-CM

## 2013-03-03 DIAGNOSIS — D229 Melanocytic nevi, unspecified: Secondary | ICD-10-CM

## 2013-03-03 MED ORDER — SULFAMETHOXAZOLE-TRIMETHOPRIM 800-160 MG PO TABS: 1.0000 | ORAL_TABLET | Freq: Two times a day (BID) | ORAL | Status: DC

## 2013-03-03 NOTE — Patient Instructions (Addendum)
It was good to see you today.  We have reviewed your prior records including labs and tests today  Medications reviewed and updated, Septra antibiotics for 1 week to treat leg redness - no other changes recommended at this time.  Your prescription(s) have been submitted to your pharmacy. Please take as directed and contact our office if you believe you are having problem(s) with the medication(s).  Continue working with your other specialists as ongoing  Please schedule followup in 4 months, call sooner if problems.   Anxiety and Panic Attacks Your caregiver has informed you that you are having an anxiety or panic attack. There may be many forms of this. Most of the time these attacks come suddenly and without warning. They come at any time of day, including periods of sleep, and at any time of life. They may be strong and unexplained. Although panic attacks are very scary, they are physically harmless. Sometimes the cause of your anxiety is not known. Anxiety is a protective mechanism of the body in its fight or flight mechanism. Most of these perceived danger situations are actually nonphysical situations (such as anxiety over losing a job). CAUSES  The causes of an anxiety or panic attack are many. Panic attacks may occur in otherwise healthy people given a certain set of circumstances. There may be a genetic cause for panic attacks. Some medications may also have anxiety as a side effect. SYMPTOMS  Some of the most common feelings are:  Intense terror.  Dizziness, feeling faint.  Hot and cold flashes.  Fear of going crazy.  Feelings that nothing is real.  Sweating.  Shaking.  Chest pain or a fast heartbeat (palpitations).  Smothering, choking sensations.  Feelings of impending doom and that death is near.  Tingling of extremities, this may be from over-breathing.  Altered reality (derealization).  Being detached from yourself (depersonalization). Several symptoms can  be present to make up anxiety or panic attacks. DIAGNOSIS  The evaluation by your caregiver will depend on the type of symptoms you are experiencing. The diagnosis of anxiety or panic attack is made when no physical illness can be determined to be a cause of the symptoms. TREATMENT  Treatment to prevent anxiety and panic attacks may include:  Avoidance of circumstances that cause anxiety.  Reassurance and relaxation.  Regular exercise.  Relaxation therapies, such as yoga.  Psychotherapy with a psychiatrist or therapist.  Avoidance of caffeine, alcohol and illegal drugs.  Prescribed medication. SEEK IMMEDIATE MEDICAL CARE IF:   You experience panic attack symptoms that are different than your usual symptoms.  You have any worsening or concerning symptoms. Document Released: 04/28/2005 Document Revised: 07/21/2011 Document Reviewed: 08/30/2009 [facility] Patient Information 2014 [place], [place].

## 2013-03-03 NOTE — Progress Notes (Signed)
Subjective:    Patient ID: [name], male    DOB: 01-19-1951, 62 y.o.   MRN: [id number]  HPI Here for follow up -reviewed interval issues and chronic medical issues:   Past Medical History  Diagnosis Date  . Osteoarthritis, knee     s/p B TKA  . HIV infection dx 1993  . TIA (transient ischemic attack) 1997    mild residual L mouth droop  . H/O hiatal hernia 2008    surgery  . Diabetes mellitus     no med since 08/2011  . Gynecomastia, male   . Impotence of organic origin   . Rheumatoid arthritis(714.0) dx 2010    MTX, follows with rheum  . Gout   . Chronic back pain     follows with Nsurg  . Coronary artery disease 2010    s/p CABG '10, sees Dr. Antoine Poche  . Seasonal allergies   . Diverticulosis   . Gallstones   . Status post dilation of esophageal narrowing   . GERD (gastroesophageal reflux disease)   . Diverticulosis   . Esophagitis   . Hemorrhoids   . Tubular adenoma of colon   . Myocardial infarction 2010     x 2  . Hypertension   . DVT, lower extremity, recurrent 2008, 2009    LLE, chronic anticoag since 2009  . Carotid artery occlusion     40-60% right ICA stenosis (09/2008)  . Neuromuscular disorder     neuropathy  . Fibromyalgia   . CLL (chronic lymphoblastic leukemia) dx 2010    Followed at [facility], no current therapy   . Hepatitis A yrs ago  . PONV (postoperative nausea and vomiting)     Review of Systems  Constitutional: Positive for fatigue. Negative for fever, chills, appetite change and unexpected weight change.  Eyes: Negative for visual disturbance.  Respiratory: Negative for cough, shortness of breath and wheezing. Choking: trouble swallowing > 2 years    Cardiovascular: Positive for leg swelling (RLE calf, very mild x 4 days with redness in last 48h). Negative for chest pain and palpitations.  Gastrointestinal: Negative for nausea, abdominal pain, diarrhea, constipation and abdominal distention.  Musculoskeletal: Positive for  arthralgias, back pain, gait problem, joint swelling and myalgias.  Allergic/Immunologic: Positive for immunocompromised state.  Neurological: Positive for tremors (R hand, with ADLs) and weakness (generalized - BLE and hands). Negative for dizziness, seizures, facial asymmetry, speech difficulty, numbness and headaches.  Psychiatric/Behavioral: Negative for suicidal ideas and dysphoric mood. The patient is not nervous/anxious.        Objective:   Physical Exam BP 130/72  Pulse 62  Temp(Src) 98.2 F (36.8 C) (Oral)  Wt 188 lb 1.9 oz (85.331 kg)  BMI 29.46 kg/m2  SpO2 96% Wt Readings from Last 3 Encounters:  03/03/13 188 lb 1.9 oz (85.331 kg)  02/03/13 186 lb 12.8 oz (84.732 kg)  12/30/12 180 lb (81.647 kg)   Constitutional:  He is frail, but appears well-developed and well-nourished. No distress.  Neck: Normal range of motion. Neck supple. No JVD or LAD present. No thyromegaly present.  Cardiovascular: Normal rate, regular rhythm and normal heart sounds.  No murmur heard. no BLE edema Pulmonary/Chest: Effort normal and breath sounds normal. No respiratory distress. no wheezes. Musculoskeletal: spine deformity with prominence of lumbar rod at lower T level and "bony tumor" mid T spine. tender to direct palpation - gait is forward bent (90 degrees+) at hips, aided with cane. B knees s/p TKR, no effusion -  B hands with MCP radial deviation. no synovitis or effusions.  Skin: RLE calf with phlebitis changes - warm and red - diffusely very tan - Skin is warm and dry.  AKs on scalp and ?change mole on face - slight asymmetry on left check but <49mm size -No facial erythema or ulceration.  Psychiatric: he has a brighter mood and affect. behavior is normal. Judgment and thought content normal.   Lab Results  Component Value Date   WBC 9.7 02/03/2013   HGB 13.7 02/03/2013   HCT 38.7* 02/03/2013   PLT 156 02/03/2013   GLUCOSE 75 02/03/2013   CHOL 120 08/03/2012   TRIG 106 08/03/2012   HDL 31*  08/03/2012   LDLCALC 68 08/03/2012   ALT 26 02/03/2013   AST 29 02/03/2013   NA 139 02/03/2013   K 4.4 02/03/2013   CL 103 02/03/2013   CREATININE 0.85 02/03/2013   BUN 17 02/03/2013   CO2 31 02/03/2013   TSH 1.04 06/22/2012   INR 4.5 02/17/2013   HGBA1C 4.9 06/22/2012        Assessment & Plan:   RLE calf phlebitis with early cellulitis - Septra - ex done  AK and change in mole - refer to derm  See problem list. Medications and labs reviewed today.

## 2013-03-03 NOTE — Assessment & Plan Note (Signed)
chronic symptoms - previously on prozac, then sertraline, but none since 2007 started cymbalta summer 2014 because of increasing symptoms  also for help with chronic pain syndrome symptoms improved, but increasing nocturnal panic attacks education on same provided, but pt declines need for medication at this time The current medical regimen is effective;  continue present plan and medications.

## 2013-03-03 NOTE — Assessment & Plan Note (Signed)
Increasing symptoms, especially a.m. And with solid foods History of esophageal stricture, last dilation at [facility] in 2011 Continue working with GI Pyrtle on same - EGD planned 03/15/13 -will consider stricture dilation as needed

## 2013-03-03 NOTE — Progress Notes (Signed)
Pre-visit discussion using our clinic review tool. No additional management support is needed unless otherwise documented below in the visit note.  

## 2013-03-09 ENCOUNTER — Other Ambulatory Visit: Payer: Self-pay | Admitting: Internal Medicine

## 2013-03-10 ENCOUNTER — Ambulatory Visit: Payer: Medicare Other | Admitting: Neurology

## 2013-03-10 ENCOUNTER — Other Ambulatory Visit: Payer: Medicare Other

## 2013-03-15 ENCOUNTER — Encounter (HOSPITAL_COMMUNITY): Payer: Self-pay | Admitting: *Deleted

## 2013-03-15 ENCOUNTER — Encounter (HOSPITAL_COMMUNITY): Payer: Medicare Other | Admitting: Anesthesiology

## 2013-03-15 ENCOUNTER — Telehealth: Payer: Self-pay | Admitting: *Deleted

## 2013-03-15 ENCOUNTER — Other Ambulatory Visit: Payer: Self-pay | Admitting: *Deleted

## 2013-03-15 ENCOUNTER — Ambulatory Visit (HOSPITAL_COMMUNITY)
Admission: RE | Admit: 2013-03-15 | Discharge: 2013-03-15 | Disposition: A | Discharge status: Home / Self Care | Payer: Medicare Other | Source: Ambulatory Visit | Attending: Internal Medicine | Admitting: Internal Medicine

## 2013-03-15 ENCOUNTER — Ambulatory Visit (HOSPITAL_COMMUNITY): Payer: Medicare Other | Admitting: Anesthesiology

## 2013-03-15 ENCOUNTER — Encounter (HOSPITAL_COMMUNITY)
Admission: RE | Disposition: A | Discharge status: Home / Self Care | Payer: Self-pay | Source: Ambulatory Visit | Attending: Internal Medicine

## 2013-03-15 DIAGNOSIS — M069 Rheumatoid arthritis, unspecified: Secondary | ICD-10-CM | POA: Diagnosis not present

## 2013-03-15 DIAGNOSIS — IMO0001 Reserved for inherently not codable concepts without codable children: Secondary | ICD-10-CM | POA: Diagnosis not present

## 2013-03-15 DIAGNOSIS — R1310 Dysphagia, unspecified: Secondary | ICD-10-CM | POA: Diagnosis not present

## 2013-03-15 DIAGNOSIS — K319 Disease of stomach and duodenum, unspecified: Secondary | ICD-10-CM | POA: Insufficient documentation

## 2013-03-15 DIAGNOSIS — K219 Gastro-esophageal reflux disease without esophagitis: Secondary | ICD-10-CM | POA: Diagnosis not present

## 2013-03-15 DIAGNOSIS — Z96659 Presence of unspecified artificial knee joint: Secondary | ICD-10-CM | POA: Diagnosis not present

## 2013-03-15 DIAGNOSIS — B20 Human immunodeficiency virus [HIV] disease: Secondary | ICD-10-CM

## 2013-03-15 DIAGNOSIS — Z860100 Personal history of colon polyps, unspecified: Secondary | ICD-10-CM | POA: Insufficient documentation

## 2013-03-15 DIAGNOSIS — Z7901 Long term (current) use of anticoagulants: Secondary | ICD-10-CM | POA: Insufficient documentation

## 2013-03-15 DIAGNOSIS — I252 Old myocardial infarction: Secondary | ICD-10-CM | POA: Insufficient documentation

## 2013-03-15 DIAGNOSIS — Z951 Presence of aortocoronary bypass graft: Secondary | ICD-10-CM | POA: Diagnosis not present

## 2013-03-15 DIAGNOSIS — C9110 Chronic lymphocytic leukemia of B-cell type not having achieved remission: Secondary | ICD-10-CM | POA: Insufficient documentation

## 2013-03-15 DIAGNOSIS — K208 Other esophagitis without bleeding: Secondary | ICD-10-CM | POA: Insufficient documentation

## 2013-03-15 DIAGNOSIS — Z8673 Personal history of transient ischemic attack (TIA), and cerebral infarction without residual deficits: Secondary | ICD-10-CM | POA: Diagnosis not present

## 2013-03-15 DIAGNOSIS — K209 Esophagitis, unspecified without bleeding: Secondary | ICD-10-CM | POA: Diagnosis not present

## 2013-03-15 DIAGNOSIS — R12 Heartburn: Secondary | ICD-10-CM | POA: Diagnosis not present

## 2013-03-15 DIAGNOSIS — Z86718 Personal history of other venous thrombosis and embolism: Secondary | ICD-10-CM | POA: Diagnosis not present

## 2013-03-15 DIAGNOSIS — Z21 Asymptomatic human immunodeficiency virus [HIV] infection status: Secondary | ICD-10-CM | POA: Insufficient documentation

## 2013-03-15 DIAGNOSIS — I10 Essential (primary) hypertension: Secondary | ICD-10-CM | POA: Diagnosis not present

## 2013-03-15 HISTORY — PX: ESOPHAGOGASTRODUODENOSCOPY (EGD) WITH PROPOFOL: SHX5813

## 2013-03-15 LAB — GLUCOSE, CAPILLARY: Glucose-Capillary: 82 mg/dL (ref 70–99)

## 2013-03-15 SURGERY — ESOPHAGOGASTRODUODENOSCOPY (EGD) WITH PROPOFOL
Anesthesia: Monitor Anesthesia Care

## 2013-03-15 MED ORDER — PROPOFOL INFUSION 10 MG/ML OPTIME
INTRAVENOUS | Status: DC | PRN
  Administered 2013-03-15: 160 ug/kg/min via INTRAVENOUS

## 2013-03-15 MED ORDER — KETAMINE HCL 10 MG/ML IJ SOLN
INTRAMUSCULAR | Status: DC | PRN
  Administered 2013-03-15: 20 mg via INTRAVENOUS

## 2013-03-15 MED ORDER — MIDAZOLAM HCL 5 MG/5ML IJ SOLN
INTRAMUSCULAR | Status: DC | PRN
  Administered 2013-03-15: 2 mg via INTRAVENOUS

## 2013-03-15 MED ORDER — LACTATED RINGERS IV SOLN
INTRAVENOUS | Status: DC
  Administered 2013-03-15: 1000 mL via INTRAVENOUS

## 2013-03-15 MED ORDER — BUTAMBEN-TETRACAINE-BENZOCAINE 2-2-14 % EX AERO
INHALATION_SPRAY | CUTANEOUS | Status: DC | PRN
  Administered 2013-03-15: 2 via TOPICAL

## 2013-03-15 MED ORDER — GLYCOPYRROLATE 0.2 MG/ML IJ SOLN
INTRAMUSCULAR | Status: DC | PRN
  Administered 2013-03-15: 0.2 mg via INTRAVENOUS

## 2013-03-15 MED ORDER — ONDANSETRON HCL 4 MG/2ML IJ SOLN
INTRAMUSCULAR | Status: DC | PRN
  Administered 2013-03-15: 4 mg via INTRAVENOUS

## 2013-03-15 MED ORDER — LIDOCAINE HCL (CARDIAC) 20 MG/ML IV SOLN
INTRAVENOUS | Status: DC | PRN
  Administered 2013-03-15: 100 mg via INTRAVENOUS

## 2013-03-15 MED ORDER — SODIUM CHLORIDE 0.9 % IV SOLN: INTRAVENOUS | Status: DC

## 2013-03-15 MED ORDER — FENTANYL CITRATE 0.05 MG/ML IJ SOLN: 25.0000 ug | INTRAMUSCULAR | Status: DC | PRN

## 2013-03-15 SURGICAL SUPPLY — 14 items

## 2013-03-15 NOTE — Anesthesia Postprocedure Evaluation (Signed)
  Anesthesia Post-op Note  Patient: [name]  Procedure(s) Performed: Procedure(s) (LRB): ESOPHAGOGASTRODUODENOSCOPY (EGD) WITH PROPOFOL (N/A)  Patient Location: PACU  Anesthesia Type: MAC  Level of Consciousness: awake and alert   Airway and Oxygen Therapy: Patient Spontanous Breathing  Post-op Pain: mild  Post-op Assessment: Post-op Vital signs reviewed, Patient's Cardiovascular Status Stable, Respiratory Function Stable, Patent Airway and No signs of Nausea or vomiting  Last Vitals:  Filed Vitals:   03/15/13 0917  BP: 158/85  Pulse: 64  Temp: 36.4 C  Resp:     Post-op Vital Signs: stable   Complications: No apparent anesthesia complications

## 2013-03-15 NOTE — Preoperative (Signed)
Beta Blockers   Reason not to administer Beta Blockers:Not Applicable 

## 2013-03-15 NOTE — Op Note (Signed)
[facility] [address] [place], [address]   ENDOSCOPY PROCEDURE REPORT  PATIENT: [name], Christopher.  MR#: [id number] BIRTHDATE: Aug 20, 1950 , 62  yrs. old GENDER: Male ENDOSCOPIST: Beverley Fiedler, MD PROCEDURE DATE:  03/15/2013 PROCEDURE:  EGD w/ biopsy; EGD with balloon dilation ASA CLASS:     Class III INDICATIONS:  Dysphagia.   history of erosive esophagitis. MEDICATIONS: MAC sedation, administered by CRNA and See Anesthesia Report. TOPICAL ANESTHETIC: none  DESCRIPTION OF PROCEDURE: After the risks benefits and alternatives of the procedure were thoroughly explained, informed consent was obtained.  The Pentax standard adult upper endoscope 921 endoscope was introduced through the mouth and advanced to the second portion of the duodenum. Without limitations.  The instrument was slowly withdrawn as the mucosa was fully examined.     ESOPHAGUS: The mucosa of the esophagus appeared normal.  The previously seen erosive esophagitis has healed.Multiple biopsies were taken in the distal and mid esophagus to rule out eosinophilic esophagitis.   A variable Z-line was observed 38 cm from the incisors without obvious stricture or ring.  Given symptoms of dysphagia empiric dilation was performed with TTS balloon to 18 mm, minimal resistance.  No heme or mucosal tear observed post dilation.  STOMACH: There was mild antral gastropathy noted, overall improved gastritis from previous exam.   The stomach otherwise appeared normal.  DUODENUM: The duodenal mucosa showed no abnormalities in the bulb and second portion of the duodenum.  Retroflexed views revealed no abnormalities.     The scope was then withdrawn from the patient and the procedure completed.  COMPLICATIONS: There were no complications.  ENDOSCOPIC IMPRESSION: 1.   The mucosa of the esophagus appeared normal; multiple biopsies were taken in the distal and mid esophagus to rule out  eosinophilic esophagitis 2.   Variable Z-line was observed 38 cm from the incisors; dilation performed to 18 mm 3.   There was mild antral gastropathy noted, overall improved from previous appearance 4.   The stomach otherwise appeared normal 5.   The duodenal mucosa showed no abnormalities in the bulb and second portion of the duodenum   RECOMMENDATIONS: 1.  Await biopsy results 2.  Continue pantoprazole at current dose 3.  Office followup in about a month.  If no improvement in dysphagia, consider esophageal manometry 4.  Okay to resume anticoagulation per anticoagulation clinic instructions   eSigned:  Beverley Fiedler, MD 03/15/2013 9:12 AM   XB:[id number] A Felicity Coyer, MD and The Patient  PATIENT NAME:  Christopher, [name]. MR#: [id number]

## 2013-03-15 NOTE — H&P (Signed)
HPI: [name] is a 62 yo male with PMH of HIV well-controlled on antiretrovirals therapy, CLL, rheumatoid arthritis on infliximab, history of DVT on chronic warfarin therapy, CAD, hypertension, diabetes who present for EGD for ongoing dysphagia.  Upper endoscopy was performed on 12/28/2012 which revealed LA class C esophagitis, erosive gastritis and a normal examined duodenum. Biopsies showed gastric body and antral type mucosa with mild chronic inflammation but no H. pylori, metaplasia or dysplasia. Colonoscopy performed on the same day revealed a 5 mm transverse colon adenoma which was removed with cold snare, moderate diverticulosis in the left colon and internal hemorrhoids. Twice daily pantoprazole was recommended after his procedure, but this required a change to his HIV medications so as not to interfere with drug efficacy. The change was made on advisement from his ID physician, Dr. Luciana Axe. He has remained on twice-daily pantoprazole which has helped significantly with his heartburn but not completely relieved it. He is status post Nissen and wonders why his reflux is now worse. Still having issues with food sticking after swallowing which he feels is in the mid chest. No odynophagia.  He has been off his warfarin under direction from his anticoagulation clinic and is on a Lovenox bridge. Last Lovenox injection was yesterday morning  Past Medical History  Diagnosis Date  . Osteoarthritis, knee     s/p B TKA  . HIV infection dx 1993  . TIA (transient ischemic attack) 1997    mild residual L mouth droop  . H/O hiatal hernia 2008    surgery  . Gynecomastia, male   . Impotence of organic origin   . Rheumatoid arthritis(714.0) dx 2010    MTX, follows with rheum  . Gout   . Chronic back pain     follows with Nsurg  . Coronary artery disease 2010    s/p CABG '10, sees Dr. Antoine Poche  . Seasonal allergies   . Diverticulosis   . Gallstones   . Status post dilation of esophageal narrowing    . GERD (gastroesophageal reflux disease)   . Diverticulosis   . Esophagitis   . Hemorrhoids   . Tubular adenoma of colon   . Myocardial infarction 2010     x 2  . Hypertension   . DVT, lower extremity, recurrent 2008, 2009    LLE, chronic anticoag since 2009  . Carotid artery occlusion     40-60% right ICA stenosis (09/2008)  . Neuromuscular disorder     neuropathy  . Fibromyalgia   . CLL (chronic lymphoblastic leukemia) dx 2010    Followed at [facility] q61mo, no current therapy   . Hepatitis A yrs ago  . PONV (postoperative nausea and vomiting)     Past Surgical History  Procedure Laterality Date  . Spine surgery  2010    "rod and screws", "failed", lopwer spine,   . Cholecystectomy    . Hiatal hernia repair      wrap  . Shoulder surgery Left   . Mandible surgery Bilateral     tmj  . Varicose vein      stripping  . Replacement total knee Bilateral   . Knee arthroplasty  07/22/2011    Procedure: COMPUTER ASSISTED TOTAL KNEE ARTHROPLASTY;  Surgeon: Cammy Copa, MD;  Location: [facility] OR;  Service: Orthopedics;  Laterality: Right;  Right total knee arthroplasty  . Coronary artery bypass graft  2010    triple bypass  . Hardware removal N/A 07/02/2012    Procedure: HARDWARE REMOVAL;  Surgeon: Jillyn Hidden  Alease Medina, MD;  Location: [facility];  Service: Neurosurgery;  Laterality: N/A;  . Inguinal hernia repair Bilateral   . Umbilical hernia repair      x 1  . Rotator cuff repair Right   . Esophagogastroduodenoscopy (egd) with propofol N/A 12/28/2012    Procedure: ESOPHAGOGASTRODUODENOSCOPY (EGD) WITH PROPOFOL;  Surgeon: Beverley Fiedler, MD;  Location: [facility];  Service: Gastroenterology;  Laterality: N/A;  . Colonoscopy with propofol N/A 12/28/2012    Procedure: COLONOSCOPY WITH PROPOFOL;  Surgeon: Beverley Fiedler, MD;  Location: [facility];  Service: Gastroenterology;  Laterality: N/A;  . Joint replacement Left 1999  . Tonsillectomy    . Ring around testicle hernia reapir  184 and 1986     x 2    Current Facility-Administered Medications  Medication Dose Route Frequency Provider Last Rate Last Dose  . 0.9 %  sodium chloride infusion   Intravenous Continuous Beverley Fiedler, MD      . fentaNYL (SUBLIMAZE) injection 25-50 mcg  25-50 mcg Intravenous Q5 min PRN Gaetano Hawthorne, MD      . lactated ringers infusion   Intravenous Continuous Gaetano Hawthorne, MD 125 mL/hr at 03/15/13 0750 1,000 mL at 03/15/13 0750    Allergies  Allergen Reactions  . Other Anaphylaxis and Hives    Pecan  . Morphine     REACTION: severe headache  . Oxycodone-Acetaminophen     REACTION: headache  . Peanut-Containing Drug Products Hives    Swelling of throat  . Penicillins     REACTION: red, flushed  . Promethazine Hcl     REACTION: makes him feel drunk    Family History  Problem Relation Age of Onset  . Breast cancer Mother   . Prostate cancer Father   . Hypertension Mother   . Hyperlipidemia Mother   . Diabetes Mother   . Colon polyps Father   . Crohn's disease Paternal Aunt   . Diabetes Maternal Grandmother   . Diabetes Brother     x 3  . Heart disease Brother     x 3  . Hyperlipidemia Father   . Hyperlipidemia Brother     x 3    History  Substance Use Topics  . Smoking status: Never Smoker   . Smokeless tobacco: Never Used     Comment: occ wine, single - married and divorced x 2; 3 kids  . Alcohol Use: Yes     Comment: occasional wine- 1-2 per week    ROS: As per history of present illness, otherwise negative  BP 137/82  Temp(Src) 97.7 F (36.5 C) (Oral)  Resp 18  Ht 5\' 7"  (1.702 m)  SpO2 95% Gen: awake, alert, NAD HEENT: anicteric, op clear CV: RRR Pulm: CTA b/l Abd: soft, NT/ND, +BS throughout Ext: no c/c/e Neuro: nonfocal   RELEVANT LABS AND IMAGING: CBC    Component Value Date/Time   WBC 9.7 02/03/2013 1103   WBC 7.1 11/10/2012 1335   RBC 3.40* 02/03/2013 1103   RBC 3.31* 11/10/2012 1335   HGB 13.7 02/03/2013 1103   HGB 13.1 11/10/2012 1335   HCT  38.7* 02/03/2013 1103   HCT 38.0* 11/10/2012 1335   PLT 156 02/03/2013 1103   PLT 195 11/10/2012 1335   MCV 113.8* 02/03/2013 1103   MCV 114.8* 11/10/2012 1335   MCH 40.3* 02/03/2013 1103   MCH 39.4* 11/10/2012 1335   MCHC 35.4 02/03/2013 1103   MCHC 34.3 11/10/2012 1335   RDW 14.9 02/03/2013 1103  RDW 16.4* 11/10/2012 1335   LYMPHSABS 6.0* 02/03/2013 1103   LYMPHSABS 4.1* 11/10/2012 1335   MONOABS 1.4* 02/03/2013 1103   MONOABS 0.1 11/10/2012 1335   EOSABS 0.1 02/03/2013 1103   EOSABS 0.0 11/10/2012 1335   BASOSABS 0.0 02/03/2013 1103   BASOSABS 0.0 11/10/2012 1335    CMP     Component Value Date/Time   NA 139 02/03/2013 1103   NA 138 11/10/2012 1335   K 4.4 02/03/2013 1103   K 4.3 11/10/2012 1335   CL 103 02/03/2013 1103   CO2 31 02/03/2013 1103   CO2 25 11/10/2012 1335   GLUCOSE 75 02/03/2013 1103   GLUCOSE 131 11/10/2012 1335   BUN 17 02/03/2013 1103   BUN 15.1 11/10/2012 1335   CREATININE 0.85 02/03/2013 1103   CREATININE 0.9 11/10/2012 1335   CREATININE 0.72 07/02/2012 1757   CALCIUM 8.8 02/03/2013 1103   CALCIUM 8.5 11/10/2012 1335   PROT 6.1 02/03/2013 1103   PROT 6.5 11/10/2012 1335   ALBUMIN 4.0 02/03/2013 1103   ALBUMIN 3.6 11/10/2012 1335   AST 29 02/03/2013 1103   AST 44* 11/10/2012 1335   ALT 26 02/03/2013 1103   ALT 36 11/10/2012 1335   ALKPHOS 79 02/03/2013 1103   ALKPHOS 97 11/10/2012 1335   BILITOT 0.8 02/03/2013 1103   BILITOT 2.83* 11/10/2012 1335   GFRNONAA >90 07/02/2012 1757   GFRAA >90 07/02/2012 1757    ASSESSMENT/PLAN: 62 yo male with PMH of HIV well-controlled on antiretrovirals therapy, CLL, rheumatoid arthritis on infliximab, history of DVT on chronic warfarin therapy, CAD, hypertension, diabetes who present for EGD for ongoing dysphagia  1. Dysphagia/history of reflux esophagitis -- plan for repeat EGD today with possible dilation for persistent dysphagia assuming he's had healing of his reflux esophagitis.The nature of the procedure, as well as the risks, benefits, and alternatives were carefully  and thoroughly reviewed with the patient. Ample time for discussion and questions allowed. The patient understood, was satisfied, and agreed to proceed.

## 2013-03-15 NOTE — Anesthesia Preprocedure Evaluation (Signed)
Anesthesia Evaluation  Patient identified by MRN, date of birth, ID band Patient awake    Reviewed: Allergy & Precautions, H&P , NPO status , Patient's Chart, lab work & pertinent test results  History of Anesthesia Complications (+) PONV  Airway Mallampati: II TM Distance: >3 FB Neck ROM: full    Dental no notable dental hx.    Pulmonary neg pulmonary ROS,  breath sounds clear to auscultation  Pulmonary exam normal       Cardiovascular Exercise Tolerance: Good hypertension, Pt. on home beta blockers + CAD, + Past MI and + CABG Rhythm:regular Rate:Normal     Neuro/Psych Depression Carotid occlusion. Residual mouth drop from TIA/ CVA TIACVA, Residual Symptoms negative psych ROS   GI/Hepatic negative GI ROS, Neg liver ROS, GERD-  Medicated and Controlled,  Endo/Other  diabetes, Well Controlled, Type 2No meds for DM  Renal/GU negative Renal ROS  negative genitourinary   Musculoskeletal  (+) Arthritis -, Rheumatoid disorders,  Fibromyalgia -  Abdominal   Peds  Hematology  (+) HIV, No meds for DM   Anesthesia Other Findings Chronic lymphoblastic leukemia  Reproductive/Obstetrics negative OB ROS                           Anesthesia Physical Anesthesia Plan  ASA: III  Anesthesia Plan: MAC   Post-op Pain Management:    Induction:   Airway Management Planned: Simple Face Mask  Additional Equipment:   Intra-op Plan:   Post-operative Plan:   Informed Consent: I have reviewed the patients History and Physical, chart, labs and discussed the procedure including the risks, benefits and alternatives for the proposed anesthesia with the patient or authorized representative who has indicated his/her understanding and acceptance.   Dental Advisory Given  Plan Discussed with: CRNA and Surgeon  Anesthesia Plan Comments:         Anesthesia Quick Evaluation

## 2013-03-15 NOTE — Transfer of Care (Signed)
Immediate Anesthesia Transfer of Care Note  Patient: [name]  Procedure(s) Performed: Procedure(s) (LRB): ESOPHAGOGASTRODUODENOSCOPY (EGD) WITH PROPOFOL (N/A)  Patient Location: PACU  Anesthesia Type: MAC  Level of Consciousness: sedated, patient cooperative and responds to stimulation  Airway & Oxygen Therapy: Patient Spontanous Breathing and Patient connected to face mask oxgen  Post-op Assessment: Report given to PACU RN and Post -op Vital signs reviewed and stable  Post vital signs: Reviewed and stable  Complications: No apparent anesthesia complications

## 2013-03-15 NOTE — Telephone Encounter (Signed)
Patient pharmacy called to try and get a refill on his valtrex I see it in his chart I just do not see a Dx for it. Is it ok to fill it and if so what Dx do I use? Please advise

## 2013-03-15 NOTE — Anesthesia Postprocedure Evaluation (Signed)
  Anesthesia Post-op Note  Patient: [name]  Procedure(s) Performed: Procedure(s) (LRB): ESOPHAGOGASTRODUODENOSCOPY (EGD) WITH PROPOFOL (N/A)  Patient Location: PACU  Anesthesia Type: MAC  Level of Consciousness: awake and alert   Airway and Oxygen Therapy: Patient Spontanous Breathing  Post-op Pain: mild  Post-op Assessment: Post-op Vital signs reviewed, Patient's Cardiovascular Status Stable, Respiratory Function Stable, Patent Airway and No signs of Nausea or vomiting  Last Vitals:  Filed Vitals:   03/15/13 0930  BP: 146/88  Pulse:   Temp:   Resp: 15    Post-op Vital Signs: stable   Complications: No apparent anesthesia complications

## 2013-03-16 ENCOUNTER — Telehealth: Payer: Self-pay | Admitting: *Deleted

## 2013-03-16 ENCOUNTER — Other Ambulatory Visit: Payer: Self-pay | Admitting: Internal Medicine

## 2013-03-16 ENCOUNTER — Encounter (HOSPITAL_COMMUNITY): Payer: Self-pay | Admitting: Internal Medicine

## 2013-03-16 MED ORDER — VALACYCLOVIR HCL 500 MG PO TABS: 500.0000 mg | ORAL_TABLET | Freq: Every day | ORAL | Status: DC

## 2013-03-16 MED ORDER — PROMETHAZINE HCL 12.5 MG RE SUPP: 12.5000 mg | Freq: Four times a day (QID) | RECTAL | Status: DC | PRN

## 2013-03-16 NOTE — OR Nursing (Signed)
Patient called to report that he ate pancakes,eggs, a biscuit and a soda yesterday after his procedure and began to have severe chills.  He slept under an electric blanket at a high setting last night.  Today he still "feels under the weather" and is having regurg with occasional feeling that he might vomit which he has not done.  He did take his meds and was able to keep them down as well as his breakfast.    I paged Dr. Rhea Belton and gave him the patient's phone number.

## 2013-03-16 NOTE — Telephone Encounter (Signed)
Called pt after Dr Rhea Belton reports pt had problems after his procedure yesterday. Pt reports he had chills last night and this am as well as reflux; also feels queasy. He states he has regurgitation and an awful taste in his mouth. He was able to eat breakfast, cold cereal, and take his meds and they stayed down. After his procedure and when he arrived home, he started shaking and couldn't get warm. He got under an electric blanket on high for 3 hours before getting warm. He is urinating, but his output is dark;he is drinking gatorade. Asked pt if he would like an appt today or something for nausea and I will call him in the am. Pt states he will take the Phenergan even though it is listed as an allergy/reaction; he states too much makes him drunk. We agreed to 12.5 mg suppository dose that he may increase to 2 for effectiveness. I will call pt in the am.

## 2013-03-16 NOTE — Telephone Encounter (Signed)
500 mg daily or every other day is fine or whatever he was doing

## 2013-03-16 NOTE — Telephone Encounter (Signed)
Done

## 2013-03-17 ENCOUNTER — Encounter: Payer: Self-pay | Admitting: Internal Medicine

## 2013-03-17 NOTE — Telephone Encounter (Signed)
Pt states he's doing much better this am. He reports no chills or nausea this am. He ate last night and has had coffee this am; no distress reported. Pt was instructed to call for any other problems or questions and he stated understanding.

## 2013-03-21 ENCOUNTER — Other Ambulatory Visit: Payer: Self-pay | Admitting: Internal Medicine

## 2013-03-22 ENCOUNTER — Other Ambulatory Visit: Payer: Self-pay | Admitting: Internal Medicine

## 2013-03-23 ENCOUNTER — Other Ambulatory Visit: Payer: Self-pay | Admitting: Internal Medicine

## 2013-03-23 MED ORDER — WARFARIN SODIUM 10 MG PO TABS: ORAL_TABLET | ORAL | Status: DC

## 2013-03-23 NOTE — Telephone Encounter (Signed)
This was closed on 03/17/13

## 2013-03-24 ENCOUNTER — Ambulatory Visit (INDEPENDENT_AMBULATORY_CARE_PROVIDER_SITE_OTHER): Payer: Medicare Other | Admitting: Neurology

## 2013-03-24 ENCOUNTER — Ambulatory Visit (INDEPENDENT_AMBULATORY_CARE_PROVIDER_SITE_OTHER): Payer: Medicare Other | Admitting: Internal Medicine

## 2013-03-24 ENCOUNTER — Encounter: Payer: Self-pay | Admitting: Neurology

## 2013-03-24 ENCOUNTER — Encounter: Payer: Self-pay | Admitting: Internal Medicine

## 2013-03-24 ENCOUNTER — Ambulatory Visit: Payer: Medicare Other

## 2013-03-24 VITALS — BP 150/73 | HR 67 | Temp 97.6°F | Wt 187.0 lb

## 2013-03-24 VITALS — BP 146/70 | HR 64 | Temp 97.4°F | Wt 187.0 lb

## 2013-03-24 DIAGNOSIS — K2210 Ulcer of esophagus without bleeding: Secondary | ICD-10-CM

## 2013-03-24 DIAGNOSIS — F419 Anxiety disorder, unspecified: Secondary | ICD-10-CM

## 2013-03-24 DIAGNOSIS — K208 Other esophagitis without bleeding: Secondary | ICD-10-CM | POA: Diagnosis not present

## 2013-03-24 DIAGNOSIS — B20 Human immunodeficiency virus [HIV] disease: Secondary | ICD-10-CM

## 2013-03-24 DIAGNOSIS — F411 Generalized anxiety disorder: Secondary | ICD-10-CM

## 2013-03-24 NOTE — Assessment & Plan Note (Signed)
He is doing well on this new regimen and will continue with this. He will return in 3 months.

## 2013-03-24 NOTE — Progress Notes (Signed)
  Subjective:    Patient ID: [name], male    DOB: Oct 01, 1950, 62 y.o.   MRN: [id number]  HPI  He comes in for routine follow up.  He was changed to Prezista in place of rate has been ordered to take proton pump inhibitors to go along with his norvir, Combivir and Isentress.  He denies any missed doses.  He feels well on remicaide for RA.  D. Today saw neurology for evaluation of dementia and also is going to be evaluated for anxiety and depression. He though continues to do well with his change of regimen with labs showing continued nearly suppressed regimen and good CD4 count. No missed doses. His esophagitis is improving and did have dilatation about a week ago. His labs show no problems with his hepatic function in his bilirubin has now come to normal off of the [place].    Review of Systems  Constitutional: Negative for fever and fatigue.  HENT: Negative for sore throat and trouble swallowing.   Eyes: Negative for visual disturbance.  Respiratory: Negative for shortness of breath.   Gastrointestinal: Negative for nausea, abdominal pain, diarrhea and abdominal distention.  Musculoskeletal: Positive for arthralgias.       Much improved on remicaide  Skin: Negative for rash.  Neurological: Negative for dizziness and headaches.  Hematological: Negative for adenopathy.  Psychiatric/Behavioral: Negative for dysphoric mood.       Objective:   Physical Exam  Constitutional: He is oriented to person, place, and time. He appears well-developed.  HENT:  Mouth/Throat: No oropharyngeal exudate.  Eyes: Right eye exhibits no discharge. Left eye exhibits no discharge. No scleral icterus.  Cardiovascular: Normal rate, regular rhythm and normal heart sounds.   No murmur heard. Pulmonary/Chest: Effort normal and breath sounds normal. No respiratory distress. He has no wheezes.  Musculoskeletal:  Spinal deformity Changes of RA in hands  Lymphadenopathy:    He has no cervical adenopathy.   Neurological: He is alert and oriented to person, place, and time.  Skin: Skin is warm and dry. No rash noted.  Psychiatric: He has a normal mood and affect. His behavior is normal.          Assessment & Plan:

## 2013-03-24 NOTE — Assessment & Plan Note (Signed)
This seems to be improved with management by his gastroenterologist and is taking his proton pump inhibitor with no interaction with his new Prezista

## 2013-03-24 NOTE — Patient Instructions (Addendum)
The neuropsychological testing did not reveal any evidence of dementia or an organic cognitive impairment.  This is a good thing.  Your symptoms are most likely related to anxiety.  I think this is a good conclusion because now we can get you the proper treatment for these symptoms.  We will refer you to behavioral medicine.  Call with questions or concerns.  We will make a referral to Behavioral Medicine. I will let you know about that appointment.

## 2013-03-24 NOTE — Progress Notes (Signed)
NEUROLOGY FOLLOW UP OFFICE NOTE  [name] [id number]  HISTORY OF PRESENT ILLNESS: [name] is a 62 y.o. male with history of HIV, RA, TIA, peripheral neuropathy, recurrent DVT on warfarin, and lumbar spine surgery who presents for follow up regarding memory problems.  He was previously seen by Dr. Smiley Houseman.  Notes, images and labs personally reviewed.  He has history of baseline left sided weakness.  He came and saw Dr. Smiley Houseman for several issues.  He had difficulty swallowing, however swallowing studies have not showed any mechanical problems.  He was also concerned about his memory, as he was often losing his train of thought.  Another issue was is balance and gait.  Dr. Smiley Houseman suspected memory problems were multifactorial, and balance problems were due to worsening arthritis and sequela of lumbar surgery.  MRI of brain was performed on 07/14/12 to rule out a new stroke.  This was reviewed and revealed mild atrophy and white matter changes, but no acute or subacute infarcts.  He was recently seen by Dr. Felicity Coyer, his PCP, who started him on cymbalta for increased depression and chronic pain.  He also has had episodes where he would suddenly have a sense of shutting down, like "a motor running and then switched off."  This is accompanied by a feeling of impending doom.  It usually lasts a couple of seconds but recently it occurred while in the car and lasted longer, causing him to pull over.  There is no loss of consciousness or headache.  He also continues to stumble, although he has not had any falls.  He reports excessive drooling.    Regarding memory, it has been a progressive problem over several years.  He reports that he had an outside neuropsychological test 3 years ago at [facility].  He said that he was told he was slow to respond, but doesn't know the exact results of the test.  He reports memory problems as well as processing information.  He now has difficulty performing crossword puzzles.  He  forgets appointments and once drove to the wrong clinic for an appointment.  If he is going out to the mailbox and gets side-tracked, he will then forget to go to the mailbox.  He also has forgotten the name of his dog.  He calls his car "Malena Catholic" and has at times forgotten that name.  When he writes a check, he will misprint the date anywhere from the 1950s to the present.  He has not gotten lost while driving.  He lives alone and is able to perform all his ADLs.  Neuropsychological testing was performed in September and did not reveal any organic cognitive impairment, and reduced speed of processing and attentional capacity more likely related to anxiety and depression.  Labs: 11/24/12: INR 3.0 08/03/12: HIV RNA Quant <20, HIV RNA Quant, Log <1.30, RPR non-reactive, CD4 T cell Abs 560, CD4% Helper T cells 13%.   Carotid doppler from May 2010 reportedly showed right ICA stenosis 40-59%.  Study performed on 05/28/11 revealed bilateral stenosis 0-39%.  PAST MEDICAL HISTORY: Past Medical History  Diagnosis Date  . Osteoarthritis, knee     s/p B TKA  . HIV infection dx 1993  . TIA (transient ischemic attack) 1997    mild residual L mouth droop  . H/O hiatal hernia 2008    surgery  . Gynecomastia, male   . Impotence of organic origin   . Rheumatoid arthritis(714.0) dx 2010    MTX, follows with rheum  .  Gout   . Chronic back pain     follows with Nsurg  . Coronary artery disease 2010    s/p CABG '10, sees Dr. Antoine Poche  . Seasonal allergies   . Diverticulosis   . Gallstones   . Status post dilation of esophageal narrowing   . GERD (gastroesophageal reflux disease)   . Diverticulosis   . Esophagitis   . Hemorrhoids   . Tubular adenoma of colon   . Myocardial infarction 2010     x 2  . Hypertension   . DVT, lower extremity, recurrent 2008, 2009    LLE, chronic anticoag since 2009  . Carotid artery occlusion     40-60% right ICA stenosis (09/2008)  . Neuromuscular disorder      neuropathy  . Fibromyalgia   . CLL (chronic lymphoblastic leukemia) dx 2010    [facility], no current therapy   . Hepatitis A yrs ago  . PONV (postoperative nausea and vomiting)     MEDICATIONS: Current Outpatient Prescriptions on File Prior to Visit  Medication Sig Dispense Refill  . aspirin EC 81 MG tablet Take 81 mg by mouth daily.      [name] atorvastatin (LIPITOR) 10 MG tablet Take 10 mg by mouth at bedtime.      . B Complex-C (B-COMPLEX WITH VITAMIN C) tablet Take 1 tablet by mouth daily.      . Calcium Carb-Cholecalciferol (CALCIUM 1000 + D PO) Take 1 tablet by mouth daily.      . Darunavir Ethanolate (PREZISTA) 800 MG tablet Take 1 tablet (800 mg total) by mouth daily.  30 tablet  5  . DULoxetine (CYMBALTA) 30 MG capsule TAKE ONE CAPSULE BY MOUTH EVERY DAY  30 capsule  5  . EPINEPHrine (EPI-PEN) 0.3 mg/0.3 mL SOAJ Inject 0.3 mg into the muscle once. As needed for allergic reaction      . inFLIXimab (REMICADE) 100 MG injection Inject into the vein.      . ISENTRESS 400 MG tablet TAKE 1 TABLET BY MOUTH TWICE A DAY  60 tablet  3  . lamiVUDine-zidovudine (COMBIVIR) 150-300 MG per tablet Take 1 tablet by mouth 2 (two) times daily.      . metoprolol succinate (TOPROL-XL) 25 MG 24 hr tablet Take 25 mg by mouth at bedtime.      . NORVIR 100 MG TABS tablet TAKE 1 TABLET BY MOUTH EVERY DAY  90 tablet  3  . pantoprazole (PROTONIX) 40 MG tablet Take two tablets by mouth daily  60 tablet  4  . pregabalin (LYRICA) 200 MG capsule Take 400 mg by mouth 2 (two) times daily.      . tadalafil (CIALIS) 20 MG tablet Take 20 mg by mouth daily as needed for erectile dysfunction.      . valACYclovir (VALTREX) 500 MG tablet Take 1 tablet (500 mg total) by mouth daily.  30 tablet  3  . warfarin (COUMADIN) 10 MG tablet Take as directed by anticoagulation clinic  30 tablet  2   No current facility-administered medications on file prior to visit.    ALLERGIES: Allergies  Allergen Reactions  . Other  Anaphylaxis and Hives    Pecan  . Morphine     REACTION: severe headache  . Oxycodone-Acetaminophen     REACTION: headache  . Peanut-Containing Drug Products Hives    Swelling of throat  . Penicillins     REACTION: red, flushed  . Promethazine Hcl     REACTION: makes him  feel drunk    FAMILY HISTORY: Family History  Problem Relation Age of Onset  . Breast cancer Mother   . Prostate cancer Father   . Hypertension Mother   . Hyperlipidemia Mother   . Diabetes Mother   . Colon polyps Father   . Crohn's disease Paternal Aunt   . Diabetes Maternal Grandmother   . Diabetes Brother     x 3  . Heart disease Brother     x 3  . Hyperlipidemia Father   . Hyperlipidemia Brother     x 3    SOCIAL HISTORY: History   Social History  . Marital Status: Widowed    Spouse Name: N/A    Number of Children: 3  . Years of Education: N/A   Occupational History  . retired    Social History Main Topics  . Smoking status: Never Smoker   . Smokeless tobacco: Never Used     Comment: occ wine  . Alcohol Use: Yes     Comment: occasional wine- 1-2 per week  . Drug Use: No  . Sexual Activity: No     Comment: pt. given condoms   Other Topics Concern  . Not on file   Social History Narrative  . No narrative on file    REVIEW OF SYSTEMS: Constitutional: No fevers, chills, or sweats, no generalized fatigue, change in appetite Eyes: No visual changes, double vision, eye pain Ear, nose and throat: Swallowing problems.  No hearing loss, ear pain, nasal congestion, sore throat Cardiovascular: No chest pain, palpitations Respiratory:  No shortness of breath at rest or with exertion, wheezes GastrointestinaI: No nausea, vomiting, diarrhea, abdominal pain, fecal incontinence Genitourinary:  No dysuria, urinary retention or frequency Musculoskeletal:  No neck pain, back pain Integumentary: No rash, pruritus, skin lesions Neurological: as above Psychiatric: Anxiety Endocrine: No  palpitations, fatigue, diaphoresis, mood swings, change in appetite, change in weight, increased thirst Hematologic/Lymphatic:  No anemia, purpura, petechiae. Allergic/Immunologic: no itchy/runny eyes, nasal congestion, recent allergic reactions, rashes  PHYSICAL EXAM: Filed Vitals:   03/24/13 0912  BP: 146/70  Pulse: 64  Temp: 97.4 F (36.3 C)   General: No acute distress Head:  Normocephalic/atraumatic Formal exam not performed this visit.  IMPRESSION: Anxiety  PLAN: We discussed at length the neuropsychological findings.  I informed him that this is reassuring because it is not a neurodegenerative disease and we now know how to go from here to get him help.  He does agree that he has anxiety.  He also is agreeable to seeing psychiatry and psychology.  He is already on Cymbalta, but I feel he will need counseling and possible cognitive behavioral therapy as well.  He notes therapy has helped him in the past.  He is discharged from the neurology clinic but he may call with any questions or concerns.  30 minutes spent with patient, 100% spent counseling and coordinating care.  Shon Millet, DO  CC:  Rene Paci, MD

## 2013-03-25 ENCOUNTER — Ambulatory Visit (INDEPENDENT_AMBULATORY_CARE_PROVIDER_SITE_OTHER): Payer: Medicare Other | Admitting: General Practice

## 2013-03-25 DIAGNOSIS — Z7901 Long term (current) use of anticoagulants: Secondary | ICD-10-CM

## 2013-03-25 LAB — POCT INR: INR: 2.3

## 2013-03-28 ENCOUNTER — Encounter (HOSPITAL_COMMUNITY): Payer: Self-pay | Admitting: Emergency Medicine

## 2013-03-28 ENCOUNTER — Emergency Department (INDEPENDENT_AMBULATORY_CARE_PROVIDER_SITE_OTHER)
Admission: EM | Admit: 2013-03-28 | Discharge: 2013-03-28 | Disposition: A | Discharge status: Home / Self Care | Payer: Medicare Other | Source: Home / Self Care | Attending: Family Medicine | Admitting: Family Medicine

## 2013-03-28 DIAGNOSIS — R109 Unspecified abdominal pain: Secondary | ICD-10-CM

## 2013-03-28 LAB — POCT URINALYSIS DIP (DEVICE)
Bilirubin Urine: NEGATIVE
Glucose, UA: NEGATIVE mg/dL
Hgb urine dipstick: NEGATIVE
Ketones, ur: NEGATIVE mg/dL
Leukocytes, UA: NEGATIVE
Nitrite: NEGATIVE
Protein, ur: NEGATIVE mg/dL
Specific Gravity, Urine: 1.02 (ref 1.005–1.030)
Urobilinogen, UA: 0.2 mg/dL (ref 0.0–1.0)
pH: 6 (ref 5.0–8.0)

## 2013-03-28 MED ORDER — METRONIDAZOLE 500 MG PO TABS: 500.0000 mg | ORAL_TABLET | Freq: Three times a day (TID) | ORAL | Status: DC

## 2013-03-28 MED ORDER — CIPROFLOXACIN HCL 500 MG PO TABS: 500.0000 mg | ORAL_TABLET | Freq: Two times a day (BID) | ORAL | Status: DC

## 2013-03-28 NOTE — ED Provider Notes (Signed)
CSN: [id number]     Arrival date & time 03/28/13  1553 History   None    Chief Complaint  Patient presents with  . Abdominal Pain   (Consider location/radiation/quality/duration/timing/severity/associated sxs/prior Treatment) HPI Comments: Pt reports BLQ abd pain; reports it feels similar to previous diverticulitis episodes.    Patient is a 62 y.o. male presenting with abdominal pain. The history is provided by the patient.  Abdominal Pain This is a new problem. The current episode started yesterday. The problem occurs constantly. The problem has not changed since onset.Associated symptoms include abdominal pain. Nothing aggravates the symptoms. Relieved by: emptying bladder helps temporarily relieve pain. He has tried nothing for the symptoms.    Past Medical History  Diagnosis Date  . Osteoarthritis, knee     s/p B TKA  . HIV infection dx 1993  . TIA (transient ischemic attack) 1997    mild residual L mouth droop  . H/O hiatal hernia 2008    surgery  . Gynecomastia, male   . Impotence of organic origin   . Rheumatoid arthritis(714.0) dx 2010    MTX, follows with rheum  . Gout   . Chronic back pain     follows with Nsurg  . Coronary artery disease 2010    s/p CABG '10, sees Dr. Antoine Poche  . Seasonal allergies   . Diverticulosis   . Gallstones   . Status post dilation of esophageal narrowing   . GERD (gastroesophageal reflux disease)   . Diverticulosis   . Esophagitis   . Hemorrhoids   . Tubular adenoma of colon   . Myocardial infarction 2010     x 2  . Hypertension   . DVT, lower extremity, recurrent 2008, 2009    LLE, chronic anticoag since 2009  . Carotid artery occlusion     40-60% right ICA stenosis (09/2008)  . Neuromuscular disorder     neuropathy  . Fibromyalgia   . CLL (chronic lymphoblastic leukemia) dx 2010    [facility], no current therapy   . Hepatitis A yrs ago  . PONV (postoperative nausea and vomiting)    Past Surgical History   Procedure Laterality Date  . Spine surgery  2010    "rod and screws", "failed", lopwer spine,   . Cholecystectomy    . Hiatal hernia repair      wrap  . Shoulder surgery Left   . Mandible surgery Bilateral     tmj  . Varicose vein      stripping  . Replacement total knee Bilateral   . Knee arthroplasty  07/22/2011    Procedure: COMPUTER ASSISTED TOTAL KNEE ARTHROPLASTY;  Surgeon: Cammy Copa, MD;  Location: [facility] OR;  Service: Orthopedics;  Laterality: Right;  Right total knee arthroplasty  . Coronary artery bypass graft  2010    triple bypass  . Hardware removal N/A 07/02/2012    Procedure: HARDWARE REMOVAL;  Surgeon: Mariam Dollar, MD;  Location: [facility];  Service: Neurosurgery;  Laterality: N/A;  . Inguinal hernia repair Bilateral   . Umbilical hernia repair      x 1  . Rotator cuff repair Right   . Esophagogastroduodenoscopy (egd) with propofol N/A 12/28/2012    Procedure: ESOPHAGOGASTRODUODENOSCOPY (EGD) WITH PROPOFOL;  Surgeon: Beverley Fiedler, MD;  Location: [facility];  Service: Gastroenterology;  Laterality: N/A;  . Colonoscopy with propofol N/A 12/28/2012    Procedure: COLONOSCOPY WITH PROPOFOL;  Surgeon: Beverley Fiedler, MD;  Location: [facility]COPY;  Service: Gastroenterology;  Laterality: N/A;  . Joint replacement Left 1999  . Tonsillectomy    . Ring around testicle hernia reapir  184 and 1986    x 2  . Esophagogastroduodenoscopy (egd) with propofol N/A 03/15/2013    Procedure: ESOPHAGOGASTRODUODENOSCOPY (EGD) WITH PROPOFOL;  Surgeon: Beverley Fiedler, MD;  Location: [facility];  Service: Gastroenterology;  Laterality: N/A;   Family History  Problem Relation Age of Onset  . Breast cancer Mother   . Prostate cancer Father   . Hypertension Mother   . Hyperlipidemia Mother   . Diabetes Mother   . Colon polyps Father   . Crohn's disease Paternal Aunt   . Diabetes Maternal Grandmother   . Diabetes Brother     x 3  . Heart disease Brother     x 3  . Hyperlipidemia  Father   . Hyperlipidemia Brother     x 3   History  Substance Use Topics  . Smoking status: Never Smoker   . Smokeless tobacco: Never Used     Comment: occ wine  . Alcohol Use: Yes     Comment: occasional wine- 1-2 per week    Review of Systems  Constitutional: Negative for fever and chills.  Gastrointestinal: Positive for nausea and abdominal pain. Negative for vomiting, diarrhea, constipation and blood in stool.  Genitourinary: Negative for dysuria and flank pain.    Allergies  Other; Morphine; Oxycodone-acetaminophen; Peanut-containing drug products; Penicillins; and Promethazine hcl  Home Medications   Current Outpatient Rx  Name  Route  Sig  Dispense  Refill  . aspirin EC 81 MG tablet   Oral   Take 81 mg by mouth daily.         [name] atorvastatin (LIPITOR) 10 MG tablet   Oral   Take 10 mg by mouth at bedtime.         . B Complex-C (B-COMPLEX WITH VITAMIN C) tablet   Oral   Take 1 tablet by mouth daily.         . Calcium Carb-Cholecalciferol (CALCIUM 1000 + D PO)   Oral   Take 1 tablet by mouth daily.         . Darunavir Ethanolate (PREZISTA) 800 MG tablet   Oral   Take 1 tablet (800 mg total) by mouth daily.   30 tablet   5     This is to replace [place].  Take with 100 mg norv ...   . DULoxetine (CYMBALTA) 30 MG capsule      TAKE ONE CAPSULE BY MOUTH EVERY DAY   30 capsule   5   . ISENTRESS 400 MG tablet      TAKE 1 TABLET BY MOUTH TWICE A DAY   60 tablet   3   . lamiVUDine-zidovudine (COMBIVIR) 150-300 MG per tablet   Oral   Take 1 tablet by mouth 2 (two) times daily.         . metoprolol succinate (TOPROL-XL) 25 MG 24 hr tablet   Oral   Take 25 mg by mouth at bedtime.         . NORVIR 100 MG TABS tablet      TAKE 1 TABLET BY MOUTH EVERY DAY   90 tablet   3   . pantoprazole (PROTONIX) 40 MG tablet      Take two tablets by mouth daily   60 tablet   4   . pregabalin (LYRICA) 200 MG capsule   Oral   Take 400  mg by mouth  2 (two) times daily.         . tadalafil (CIALIS) 20 MG tablet   Oral   Take 20 mg by mouth daily as needed for erectile dysfunction.         . valACYclovir (VALTREX) 500 MG tablet   Oral   Take 1 tablet (500 mg total) by mouth daily.   30 tablet   3   . warfarin (COUMADIN) 10 MG tablet      Take as directed by anticoagulation clinic   30 tablet   2     30 day   . ciprofloxacin (CIPRO) 500 MG tablet   Oral   Take 1 tablet (500 mg total) by mouth every 12 (twelve) hours.   14 tablet   0   . EPINEPHrine (EPI-PEN) 0.3 mg/0.3 mL SOAJ   Intramuscular   Inject 0.3 mg into the muscle once. As needed for allergic reaction         . inFLIXimab (REMICADE) 100 MG injection   Intravenous   Inject into the vein.         . metroNIDAZOLE (FLAGYL) 500 MG tablet   Oral   Take 1 tablet (500 mg total) by mouth 3 (three) times daily.   21 tablet   0    BP 123/66  Pulse 60  Temp(Src) 97.8 F (36.6 C) (Oral)  Resp 12  SpO2 100% Physical Exam  Constitutional: He appears well-developed and well-nourished. No distress.  Cardiovascular: Normal rate and regular rhythm.   Pulmonary/Chest: Effort normal and breath sounds normal.  Abdominal: Normal appearance and bowel sounds are normal. He exhibits no distension. There is tenderness in the right lower quadrant and left lower quadrant. There is no rigidity, no rebound and no guarding.    ED Course  Procedures (including critical care time) Labs Review Labs Reviewed  POCT URINALYSIS DIP (DEVICE)   Imaging Review No results found.  EKG Interpretation    Date/Time:    Ventricular Rate:    PR Interval:    QRS Duration:   QT Interval:    QTC Calculation:   R Axis:     Text Interpretation:              MDM   1. Abdominal pain   Will tx as if is diverticulitis. Pt knows to f/u with pcp if no improvement and to go to ER if worsens in anyway. rx cipro 500mg  BID #14 and flagyl 500mg  TID #21.      Cathlyn Parsons, NP 03/28/13 [phone]

## 2013-03-28 NOTE — ED Notes (Signed)
C/o lower abdominal pain. Hx of diverticulitis. States after relieving bladder pain lessens. Having the urge to have a bowel movement but nothing happens.  Onset yesterday evening. Nausea early this a.m denies vomiting.  No otc meds taken for symptoms.

## 2013-03-28 NOTE — ED Provider Notes (Signed)
Medical screening examination/treatment/procedure(s) were performed by resident physician or non-physician practitioner and as supervising physician I was immediately available for consultation/collaboration.   Amberlie Gaillard DOUGLAS MD.   Malakai Schoenherr D Adrie Picking, MD 03/28/13 2101 

## 2013-04-04 ENCOUNTER — Ambulatory Visit (INDEPENDENT_AMBULATORY_CARE_PROVIDER_SITE_OTHER): Payer: Medicare Other | Admitting: General Practice

## 2013-04-04 DIAGNOSIS — Z7901 Long term (current) use of anticoagulants: Secondary | ICD-10-CM | POA: Diagnosis not present

## 2013-04-04 DIAGNOSIS — I82409 Acute embolism and thrombosis of unspecified deep veins of unspecified lower extremity: Secondary | ICD-10-CM | POA: Diagnosis not present

## 2013-04-04 DIAGNOSIS — I749 Embolism and thrombosis of unspecified artery: Secondary | ICD-10-CM | POA: Diagnosis not present

## 2013-04-04 LAB — POCT INR: INR: 5

## 2013-04-04 NOTE — Progress Notes (Signed)
Pre-visit discussion using our clinic review tool. No additional management support is needed unless otherwise documented below in the visit note.  

## 2013-04-05 ENCOUNTER — Ambulatory Visit (INDEPENDENT_AMBULATORY_CARE_PROVIDER_SITE_OTHER): Payer: Medicare Other | Admitting: Internal Medicine

## 2013-04-05 ENCOUNTER — Ambulatory Visit (INDEPENDENT_AMBULATORY_CARE_PROVIDER_SITE_OTHER): Payer: Medicare Other | Admitting: General Practice

## 2013-04-05 ENCOUNTER — Encounter: Payer: Self-pay | Admitting: Internal Medicine

## 2013-04-05 VITALS — BP 142/74 | HR 64 | Temp 98.0°F | Wt 189.4 lb

## 2013-04-05 DIAGNOSIS — Z7901 Long term (current) use of anticoagulants: Secondary | ICD-10-CM

## 2013-04-05 DIAGNOSIS — F32A Depression, unspecified: Secondary | ICD-10-CM

## 2013-04-05 DIAGNOSIS — F329 Major depressive disorder, single episode, unspecified: Secondary | ICD-10-CM

## 2013-04-05 DIAGNOSIS — F3289 Other specified depressive episodes: Secondary | ICD-10-CM | POA: Diagnosis not present

## 2013-04-05 DIAGNOSIS — I82409 Acute embolism and thrombosis of unspecified deep veins of unspecified lower extremity: Secondary | ICD-10-CM | POA: Diagnosis not present

## 2013-04-05 LAB — POCT INR: INR: 4

## 2013-04-05 MED ORDER — ESCITALOPRAM OXALATE 5 MG PO TABS: 5.0000 mg | ORAL_TABLET | Freq: Every day | ORAL | Status: DC

## 2013-04-05 NOTE — Progress Notes (Signed)
Subjective:    Patient ID: [name], male    DOB: 06-03-1950, 62 y.o.   MRN: [id number]  HPI Here for follow up -reviewed interval issues and chronic medical issues: Concerned about cymbalta causing side effects - GI and anxiety -?alt med  Past Medical History  Diagnosis Date  . Osteoarthritis, knee     s/p B TKA  . HIV infection dx 1993  . TIA (transient ischemic attack) 1997    mild residual L mouth droop  . H/O hiatal hernia 2008    surgery  . Gynecomastia, male   . Impotence of organic origin   . Rheumatoid arthritis(714.0) dx 2010    MTX, follows with rheum  . Gout   . Chronic back pain     follows with Nsurg  . Coronary artery disease 2010    s/p CABG '10, sees Dr. Antoine Poche  . Seasonal allergies   . Diverticulosis   . Gallstones   . Status post dilation of esophageal narrowing   . GERD (gastroesophageal reflux disease)   . Diverticulosis   . Esophagitis   . Hemorrhoids   . Tubular adenoma of colon   . Myocardial infarction 2010     x 2  . Hypertension   . DVT, lower extremity, recurrent 2008, 2009    LLE, chronic anticoag since 2009  . Carotid artery occlusion     40-60% right ICA stenosis (09/2008)  . Neuromuscular disorder     neuropathy  . Fibromyalgia   . CLL (chronic lymphoblastic leukemia) dx 2010    [facility], no current therapy   . Hepatitis A yrs ago  . PONV (postoperative nausea and vomiting)     Review of Systems  Constitutional: Positive for fatigue. Negative for fever, chills, appetite change and unexpected weight change.  Eyes: Negative for visual disturbance.  Respiratory: Negative for cough, shortness of breath and wheezing. Choking: trouble swallowing > 2 years    Cardiovascular: Negative for chest pain, palpitations and leg swelling.  Gastrointestinal: Negative for nausea, abdominal pain, diarrhea, constipation and abdominal distention.  Musculoskeletal: Positive for arthralgias, back pain, gait problem and myalgias.   Allergic/Immunologic: Positive for immunocompromised state.  Neurological: Positive for tremors (R hand, with ADLs) and weakness (generalized - BLE and hands). Negative for dizziness, seizures, facial asymmetry, speech difficulty, numbness and headaches.  Psychiatric/Behavioral: Positive for dysphoric mood. Negative for suicidal ideas. The patient is nervous/anxious.        Objective:   Physical Exam BP 142/74  Pulse 64  Temp(Src) 98 F (36.7 C) (Oral)  Wt 189 lb 6.4 oz (85.911 kg)  SpO2 95% Wt Readings from Last 3 Encounters:  04/05/13 189 lb 6.4 oz (85.911 kg)  03/24/13 187 lb (84.823 kg)  03/24/13 187 lb (84.823 kg)   Constitutional:  He is frail, but appears well-developed and well-nourished. No distress.  Neck: Normal range of motion. Neck supple. No JVD or LAD present. No thyromegaly present.  Cardiovascular: Normal rate, regular rhythm and normal heart sounds.  No murmur heard. no BLE edema Pulmonary/Chest: Effort normal and breath sounds normal. No respiratory distress. no wheezes. Musculoskeletal: spine deformity with prominence of lumbar rod at lower T level and "bony tumor" mid T spine. tender to direct palpation - gait is forward bent (90 degrees+) at hips, aided with cane. B knees s/p TKR, no effusion - B hands with MCP radial deviation. no synovitis or effusions.   Psychiatric: he has a dysphoric mood and affect. behavior is normal.  Judgment and thought content normal.   Lab Results  Component Value Date   WBC 9.7 02/03/2013   HGB 13.7 02/03/2013   HCT 38.7* 02/03/2013   PLT 156 02/03/2013   GLUCOSE 75 02/03/2013   CHOL 120 08/03/2012   TRIG 106 08/03/2012   HDL 31* 08/03/2012   LDLCALC 68 08/03/2012   ALT 26 02/03/2013   AST 29 02/03/2013   NA 139 02/03/2013   K 4.4 02/03/2013   CL 103 02/03/2013   CREATININE 0.85 02/03/2013   BUN 17 02/03/2013   CO2 31 02/03/2013   TSH 1.04 06/22/2012   INR 5.0 04/04/2013   HGBA1C 4.9 06/22/2012        Assessment & Plan:    See  problem list. Medications and labs reviewed today.

## 2013-04-05 NOTE — Patient Instructions (Addendum)
It was good to see you today.  Stop cymbalata Start lexapro  Your prescription(s) have been submitted to your pharmacy. Please take as directed and contact our office if you believe you are having problem(s) with the medication(s).  Please schedule followup in 2-4 weeks, call sooner if problems.  Depression, Adult Depression is feeling sad, low, down in the dumps, blue, gloomy, or empty. In general, there are two kinds of depression:  Normal sadness or grief. This can happen after something upsetting. It often goes away on its own within 2 weeks. After losing a loved one (bereavement), normal sadness and grief may last longer than two weeks. It usually gets better with time.  Clinical depression. This kind lasts longer than normal sadness or grief. It keeps you from doing the things you normally do in life. It is often hard to function at home, work, or at school. It may affect your relationships with others. Treatment is often needed. GET HELP RIGHT AWAY IF:  You have thoughts about hurting yourself or others.  You lose touch with reality (psychotic symptoms). You may:  See or hear things that are not real.  Have untrue beliefs about your life or people around you.  Your medicine is giving you problems. MAKE SURE YOU:  Understand these instructions.  Will watch your condition.  Will get help right away if you are not doing well or get worse. Document Released: 05/31/2010 Document Revised: 01/21/2012 Document Reviewed: 08/28/2011 [facility] Patient Information 2014 [place], [place].

## 2013-04-05 NOTE — Progress Notes (Signed)
Pre-visit discussion using our clinic review tool. No additional management support is needed unless otherwise documented below in the visit note.  

## 2013-04-05 NOTE — Assessment & Plan Note (Signed)
chronic symptoms - previously on prozac, then sertraline, but none since 2007 started cymbalta summer 2014 because of increasing depression symptoms  also for help with chronic pain syndrome symptoms improved, but increasing nocturnal panic attacks and ?GI side effects Will change to lexapro and stop cymbalta at pt request for new med we reviewed potential risk/benefit and possible side effects - pt understands and agrees to same  pt declines need for prn medication at this time follow up 2-4 weeks - sooner if problems

## 2013-04-12 DIAGNOSIS — B351 Tinea unguium: Secondary | ICD-10-CM | POA: Diagnosis not present

## 2013-04-12 DIAGNOSIS — L851 Acquired keratosis [keratoderma] palmaris et plantaris: Secondary | ICD-10-CM | POA: Diagnosis not present

## 2013-04-12 DIAGNOSIS — E1149 Type 2 diabetes mellitus with other diabetic neurological complication: Secondary | ICD-10-CM | POA: Diagnosis not present

## 2013-04-14 ENCOUNTER — Encounter: Payer: Self-pay | Admitting: Internal Medicine

## 2013-04-14 DIAGNOSIS — M069 Rheumatoid arthritis, unspecified: Secondary | ICD-10-CM | POA: Diagnosis not present

## 2013-04-14 DIAGNOSIS — M25559 Pain in unspecified hip: Secondary | ICD-10-CM | POA: Diagnosis not present

## 2013-04-14 DIAGNOSIS — R748 Abnormal levels of other serum enzymes: Secondary | ICD-10-CM | POA: Diagnosis not present

## 2013-04-15 ENCOUNTER — Ambulatory Visit (INDEPENDENT_AMBULATORY_CARE_PROVIDER_SITE_OTHER): Payer: Medicare Other | Admitting: Internal Medicine

## 2013-04-15 ENCOUNTER — Encounter: Payer: Self-pay | Admitting: Internal Medicine

## 2013-04-15 ENCOUNTER — Other Ambulatory Visit: Payer: Self-pay

## 2013-04-15 VITALS — BP 140/76 | HR 66 | Ht 67.0 in | Wt 190.4 lb

## 2013-04-15 DIAGNOSIS — K219 Gastro-esophageal reflux disease without esophagitis: Secondary | ICD-10-CM | POA: Diagnosis not present

## 2013-04-15 DIAGNOSIS — R1310 Dysphagia, unspecified: Secondary | ICD-10-CM

## 2013-04-15 DIAGNOSIS — K5900 Constipation, unspecified: Secondary | ICD-10-CM

## 2013-04-15 DIAGNOSIS — R1319 Other dysphagia: Secondary | ICD-10-CM

## 2013-04-15 DIAGNOSIS — Z8379 Family history of other diseases of the digestive system: Secondary | ICD-10-CM | POA: Diagnosis not present

## 2013-04-15 DIAGNOSIS — R1314 Dysphagia, pharyngoesophageal phase: Secondary | ICD-10-CM | POA: Diagnosis not present

## 2013-04-15 MED ORDER — POLYETHYLENE GLYCOL 3350 17 GM/SCOOP PO POWD: 1.0000 | Freq: Every day | ORAL | Status: DC

## 2013-04-15 MED ORDER — SACCHAROMYCES BOULARDII 250 MG PO CAPS: 250.0000 mg | ORAL_CAPSULE | Freq: Two times a day (BID) | ORAL | Status: DC

## 2013-04-15 MED ORDER — POLYETHYLENE GLYCOL 3350 17 GM/SCOOP PO POWD: ORAL | Status: DC

## 2013-04-15 NOTE — Patient Instructions (Signed)
We have sent the following medications to your pharmacy for you to pick up at your convenience: Florastor 250 twice a day, if bowel are not moving as they should add miralax, 17g daily                                               We are excited to introduce MyChart, a new best-in-class service that provides you online access to important information in your electronic medical record. We want to make it easier for you to view your health information - all in one secure location - when and where you need it. We expect MyChart will enhance the quality of care and service we provide.  When you register for MyChart, you can:    View your test results.    Request appointments and receive appointment reminders via email.    Request medication renewals.    View your medical history, allergies, medications and immunizations.    Communicate with your physician's office through a password-protected site.    Conveniently print information such as your medication lists.  To find out if MyChart is right for you, please talk to a member of our clinical staff today. We will gladly answer your questions about this free health and wellness tool.  If you are age 40 or older and want a member of your family to have access to your record, you must provide written consent by completing a proxy form available at our office. Please speak to our clinical staff about guidelines regarding accounts for patients younger than age 54.  As you activate your MyChart account and need any technical assistance, please call the MyChart technical support line at [phone]) or email your question to [email or web address]. If you email your question(s), please include your name, a return phone number and the best time to reach you.  If you have non-urgent health-related questions, you can send a message to our office through MyChart at [email or web address]. If you have a medical emergency, call  911.  Thank you for using MyChart as your new health and wellness resource!   MyChart licensed from [employer],  [id number]. Patents Pending.

## 2013-04-15 NOTE — Progress Notes (Signed)
Subjective:    Patient ID: [name], male    DOB: [birth year], 62 y.o.   MRN: [id number]  HPI Mr. Grable is a 62 yo male with PMH of HIV well-controlled on antiretrovirals therapy, CLL, rheumatoid arthritis on infliximab, history of DVT on chronic warfarin therapy, CAD, hypertension, diabetes, GERD with erosive esophagitis who is seen in followup. He had a previous upper endoscopy in August 2014 which revealed class C esophagitis and he was treated with twice a day PPI. His dysphagia symptoms persisted and he came for repeat upper endoscopy on 03/15/2013. Esophageal biopsies were taken to rule out eosinophilic esophagitis, and has previously seen reflux esophagitis had healed. A balloon dilation was performed across the GE junction to 18 mm.  There was mild antral gastropathy noted but overall improved from the previous endoscopy. The esophageal biopsies were benign and showed reflux changes. There was no Barrett's esophagus, eosinophilic esophagitis, or evidence of infectious process.  Today he reports that his swallowing function has improved significantly after the dilation. He is continued on pantoprazole 40 mg twice daily. He does still occasionally and very sporadically have issues with esophageal dysphagia. This can involve liquids or solids, but the solid food dysphasia of before has dramatically improved. He reports a good appetite without nausea or vomiting.  He has been recently treated for diverticulitis. He completed 7 days of ciprofloxacin and Flagyl. He developed mid to right mid abdominal pain which was becoming severe. He was seen by his primary care and given antibiotics. The pain did resolve with the antibiotics. He has noted alternating bowel habits which is changed over the last 2 weeks. He's had more constipation and "pellet-like" stools. He also reports having to strain with stool. He notes that he recently changed from Cymbalta to Lexapro about a week before this change was  noted. He's had no rectal bleeding or melena. He has been using Align as a probiotic which helped at first but seemed to lose its efficacy.  He does report occasional lower abdominal cramping type pain with bowel movement.  His previously reported right flank pain has improved. He still feels that this was musculoskeletal and related to his orthopedic brace which was helping straighten his spine. He thinks improvement in his posture has improved the pain.  Review of Systems As per history of present illness, otherwise negative  Current Medications, Allergies, Past Medical History, Past Surgical History, Family History and Social History were reviewed in [employer] record.     Objective:   Physical Exam BP 140/76  Pulse 66  Ht 5\' 7"  (1.702 m)  Wt 190 lb 6.4 oz (86.365 kg)  BMI 29.81 kg/m2 Constitutional: Well-developed and well-nourished. No distress. HEENT: Normocephalic and atraumatic. Oropharynx is clear and moist. No oropharyngeal exudate. Conjunctivae are normal.  No scleral icterus. Neck: Neck supple. Trachea midline. Cardiovascular: Normal rate, regular rhythm and intact distal pulses.  Pulmonary/chest: Effort normal and breath sounds normal. No wheezing, rales or rhonchi. Abdominal: Soft, nontender, nondistended. Bowel sounds active throughout.  Extremities: no clubbing, cyanosis, or edema Neurological: Alert and oriented to person place and time. Psychiatric: Normal mood and affect. Behavior is normal.  CBC    Component Value Date/Time   WBC 9.7 02/03/2013 1103   WBC 7.1 11/10/2012 1335   RBC 3.40* 02/03/2013 1103   RBC 3.31* 11/10/2012 1335   HGB 13.7 02/03/2013 1103   HGB 13.1 11/10/2012 1335   HCT 38.7* 02/03/2013 1103   HCT 38.0* 11/10/2012 1335  PLT 156 02/03/2013 1103   PLT 195 11/10/2012 1335   MCV 113.8* 02/03/2013 1103   MCV 114.8* 11/10/2012 1335   MCH 40.3* 02/03/2013 1103   MCH 39.4* 11/10/2012 1335   MCHC 35.4 02/03/2013 1103   MCHC 34.3 11/10/2012  1335   RDW 14.9 02/03/2013 1103   RDW 16.4* 11/10/2012 1335   LYMPHSABS 6.0* 02/03/2013 1103   LYMPHSABS 4.1* 11/10/2012 1335   MONOABS 1.4* 02/03/2013 1103   MONOABS 0.1 11/10/2012 1335   EOSABS 0.1 02/03/2013 1103   EOSABS 0.0 11/10/2012 1335   BASOSABS 0.0 02/03/2013 1103   BASOSABS 0.0 11/10/2012 1335    CMP     Component Value Date/Time   NA 139 02/03/2013 1103   NA 138 11/10/2012 1335   K 4.4 02/03/2013 1103   K 4.3 11/10/2012 1335   CL 103 02/03/2013 1103   CO2 31 02/03/2013 1103   CO2 25 11/10/2012 1335   GLUCOSE 75 02/03/2013 1103   GLUCOSE 131 11/10/2012 1335   BUN 17 02/03/2013 1103   BUN 15.1 11/10/2012 1335   CREATININE 0.85 02/03/2013 1103   CREATININE 0.9 11/10/2012 1335   CREATININE 0.72 07/02/2012 1757   CALCIUM 8.8 02/03/2013 1103   CALCIUM 8.5 11/10/2012 1335   PROT 6.1 02/03/2013 1103   PROT 6.5 11/10/2012 1335   ALBUMIN 4.0 02/03/2013 1103   ALBUMIN 3.6 11/10/2012 1335   AST 29 02/03/2013 1103   AST 44* 11/10/2012 1335   ALT 26 02/03/2013 1103   ALT 36 11/10/2012 1335   ALKPHOS 79 02/03/2013 1103   ALKPHOS 97 11/10/2012 1335   BILITOT 0.8 02/03/2013 1103   BILITOT 2.83* 11/10/2012 1335   GFRNONAA >90 07/02/2012 1757   GFRAA >90 07/02/2012 1757       Assessment & Plan:  62 yo male with PMH of HIV well-controlled on antiretrovirals therapy, CLL, rheumatoid arthritis on infliximab, history of DVT on chronic warfarin therapy, CAD, hypertension, diabetes, GERD with erosive esophagitis who is seen in followup.  1. GERD/hx of esophagitis/dysphagia -- fortunately his dysphagia has improved with dilation to 18 mm. There is no overt stricture noted, but fortunately he has improved. The intermittent and sporadic dysphagia which occurs with both solids and liquids is felt less likely to be secondary to esophageal dysmotility. He understands that there is very limited treatment for intermittent esophageal dysmotility. I feel that the curvature of his spine also likely contributes intermittently to his dysphagia.  His reflux esophagitis had healed as of his last endoscopy, and I will decrease his pantoprazole to 40 mg once daily at this time.   2.  Constipation/recent diverticulitis -- he is up-to-date on colonoscopy. He does have known diverticulosis, and very likely recently had diverticulitis given the response to antibiotics. His constipation of late may be related to change in his Cymbalta to Lexapro, but also could be due to change in diet bacteria after recent antibiotics. I have recommended a trial Florastor 250 mg twice daily. If this improves his bowel function, he will likely not need a laxative. However if it does not, I have recommended MiraLax 17 g daily. He can titrate this medication for more normal/frequent bowel movements. If the stools are too loose he can back off and use the medication every other day to every 3 days.  I asked that he call me if he develops lower abdominal pain similar to the recent diverticulitis. He voices understanding.   3.  Family hx of Crohn's -- he asks about Crohn's disease  due to the strong family history on his father's side. I have told him that based on his upper endoscopy and colonoscopy there is no evidence for Crohn's disease. I think the bowel habit changes over the last 2 weeks are more likely related to the issues discussed #2.  He is already on Remicade for his rheumatoid arthritis, but again I have told him I do not think he is Crohn's disease.  Follow-up as needed (at his request). Follow-up offered and he states he'll call me with any questions or concerns

## 2013-04-15 NOTE — Telephone Encounter (Signed)
Pharmacy called to clarify directions, one scoop not one container daily as needed for constipation.

## 2013-04-21 ENCOUNTER — Encounter: Payer: Self-pay | Admitting: Internal Medicine

## 2013-04-21 ENCOUNTER — Ambulatory Visit (INDEPENDENT_AMBULATORY_CARE_PROVIDER_SITE_OTHER): Payer: Medicare Other | Admitting: Internal Medicine

## 2013-04-21 VITALS — BP 130/80 | HR 68 | Temp 98.7°F | Wt 193.0 lb

## 2013-04-21 DIAGNOSIS — F3289 Other specified depressive episodes: Secondary | ICD-10-CM

## 2013-04-21 DIAGNOSIS — B20 Human immunodeficiency virus [HIV] disease: Secondary | ICD-10-CM | POA: Diagnosis not present

## 2013-04-21 DIAGNOSIS — J309 Allergic rhinitis, unspecified: Secondary | ICD-10-CM | POA: Diagnosis not present

## 2013-04-21 DIAGNOSIS — D235 Other benign neoplasm of skin of trunk: Secondary | ICD-10-CM | POA: Diagnosis not present

## 2013-04-21 DIAGNOSIS — F329 Major depressive disorder, single episode, unspecified: Secondary | ICD-10-CM | POA: Diagnosis not present

## 2013-04-21 DIAGNOSIS — L821 Other seborrheic keratosis: Secondary | ICD-10-CM | POA: Diagnosis not present

## 2013-04-21 DIAGNOSIS — F32A Depression, unspecified: Secondary | ICD-10-CM

## 2013-04-21 DIAGNOSIS — L570 Actinic keratosis: Secondary | ICD-10-CM | POA: Diagnosis not present

## 2013-04-21 MED ORDER — MOMETASONE FUROATE 50 MCG/ACT NA SUSP: 2.0000 | Freq: Every day | NASAL | Status: DC

## 2013-04-21 MED ORDER — LORATADINE 10 MG PO TABS: 10.0000 mg | ORAL_TABLET | Freq: Every day | ORAL | Status: DC

## 2013-04-21 NOTE — Progress Notes (Signed)
Pre-visit discussion using our clinic review tool. No additional management support is needed unless otherwise documented below in the visit note.  

## 2013-04-21 NOTE — Assessment & Plan Note (Signed)
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

## 2013-04-21 NOTE — Patient Instructions (Addendum)
It was good to see you today.  We have reviewed your prior records including labs and tests today  Medications reviewed and updated Use Nasonex and Claritin daily as discussed to help control allergy and sinus drainage symptoms  Followup in 3-6 months on chronic issues as discussed, please call sooner if problems Allergic Rhinitis Allergic rhinitis is when the mucous membranes in the nose respond to allergens. Allergens are particles in the air that cause your body to have an allergic reaction. This causes you to release allergic antibodies. Through a chain of events, these eventually cause you to release histamine into the blood stream (hence the use of antihistamines). Although meant to be protective to the body, it is this release that causes your discomfort, such as frequent sneezing, congestion and an itchy runny nose.  CAUSES  The pollen allergens may come from grasses, trees, and weeds. This is seasonal allergic rhinitis, or "hay fever." Other allergens cause year-round allergic rhinitis (perennial allergic rhinitis) such as house dust mite allergen, pet dander and mold spores.  SYMPTOMS   Nasal stuffiness (congestion).  Runny, itchy nose with sneezing and tearing of the eyes.  There is often an itching of the mouth, eyes and ears. It cannot be cured, but it can be controlled with medications. DIAGNOSIS  If you are unable to determine the offending allergen, skin or blood testing may find it. TREATMENT   Avoid the allergen.  Medications and allergy shots (immunotherapy) can help.  Hay fever may often be treated with antihistamines in pill or nasal spray forms. Antihistamines block the effects of histamine. There are over-the-counter medicines that may help with nasal congestion and swelling around the eyes. Check with your caregiver before taking or giving this medicine. If the treatment above does not work, there are many new medications your caregiver can prescribe. Stronger  medications may be used if initial measures are ineffective. Desensitizing injections can be used if medications and avoidance fails. Desensitization is when a patient is given ongoing shots until the body becomes less sensitive to the allergen. Make sure you follow up with your caregiver if problems continue. SEEK MEDICAL CARE IF:   You develop fever (more than 100.5 F (38.1 C).  You develop a cough that does not stop easily (persistent).  You have shortness of breath.  You start wheezing.  Symptoms interfere with normal daily activities. Document Released: 01/21/2001 Document Revised: 07/21/2011 Document Reviewed: 08/02/2008 [facility] Patient Information 2014 [place], [place].

## 2013-04-21 NOTE — Assessment & Plan Note (Signed)
chronic symptoms - previously on prozac, then sertraline, but none since 2007 started cymbalta summer 2014 because of depression symptoms and chronic pain syndrome symptoms improved, but increasing nocturnal panic attacks and ?GI side effects late 03/2013, so changed to lexapro and stopped cymbalta Improved - continue same pt declines need for prn medication at this time

## 2013-04-21 NOTE — Progress Notes (Signed)
Subjective:    Patient ID: [name], male    DOB: Dec 18, 1950, 62 y.o.   MRN: [id number]  HPI Here for follow up -reviewed interval issues and chronic medical issues:   Past Medical History  Diagnosis Date  . Osteoarthritis, knee     s/p B TKA  . HIV infection dx 1993  . TIA (transient ischemic attack) 1997    mild residual L mouth droop  . H/O hiatal hernia 2008    surgery  . Gynecomastia, male   . Impotence of organic origin   . Rheumatoid arthritis(714.0) dx 2010    MTX, follows with rheum  . Gout   . Chronic back pain     follows with Nsurg  . Coronary artery disease 2010    s/p CABG '10, sees Dr. Antoine Poche  . Seasonal allergies   . Diverticulosis   . Gallstones   . Status post dilation of esophageal narrowing   . GERD (gastroesophageal reflux disease)   . Diverticulosis   . Esophagitis   . Hemorrhoids   . Tubular adenoma of colon   . Myocardial infarction 2010     x 2  . Hypertension   . DVT, lower extremity, recurrent 2008, 2009    LLE, chronic anticoag since 2009  . Carotid artery occlusion     40-60% right ICA stenosis (09/2008)  . Neuromuscular disorder     neuropathy  . Fibromyalgia   . CLL (chronic lymphoblastic leukemia) dx 2010    Followed at [facility] q77mo, no current therapy   . Hepatitis A yrs ago  . PONV (postoperative nausea and vomiting)     Review of Systems  Constitutional: Positive for fatigue. Negative for fever, chills and unexpected weight change.  HENT: Positive for postnasal drip. Negative for ear discharge, ear pain, facial swelling, rhinorrhea and sinus pressure. Sore throat: scratchy in AM with mucus clearing.   Eyes: Negative for visual disturbance.  Respiratory: Negative for cough, shortness of breath and wheezing. Choking: trouble swallowing > 2 years , working with GI on same.   Cardiovascular: Negative for chest pain, palpitations and leg swelling.  Gastrointestinal: Negative for nausea, abdominal pain, diarrhea, constipation  and abdominal distention.  Musculoskeletal: Positive for arthralgias, back pain, gait problem and myalgias.  Allergic/Immunologic: Positive for immunocompromised state.  Neurological: Positive for tremors (R hand, with ADLs) and weakness (generalized - BLE and hands). Negative for dizziness, seizures, facial asymmetry, speech difficulty, numbness and headaches.  Psychiatric/Behavioral: Negative for suicidal ideas and dysphoric mood. The patient is not nervous/anxious.        Objective:   Physical Exam BP 130/80  Pulse 68  Temp(Src) 98.7 F (37.1 C) (Oral)  Wt 193 lb (87.544 kg)  SpO2 95% Wt Readings from Last 3 Encounters:  04/21/13 193 lb (87.544 kg)  04/15/13 190 lb 6.4 oz (86.365 kg)  04/05/13 189 lb 6.4 oz (85.911 kg)   Constitutional:  He is frail, but appears well-developed and well-nourished. No distress.  HENT: No sinus tenderness to palpation. Turbinates mildly swollen and green postnasal drip evident in oropharynx. Minimal erythema no exudate. Moderate poor dentition with no oral lesions. Tympanic membranes bilaterally hazy but no erythema or effusion Neck: Normal range of motion. Neck supple. No JVD or LAD present. No thyromegaly present.  Cardiovascular: Normal rate, regular rhythm and normal heart sounds.  No murmur heard. no BLE edema Pulmonary/Chest: Effort normal and breath sounds normal. No respiratory distress. no wheezes. Musculoskeletal: spine deformity with prominence of lumbar rod at  lower T level and "bony tumor" mid T spine. tender to direct palpation - gait is forward bent (90 degrees+) at hips, aided with cane. B knees s/p TKR, no effusion - B hands with MCP radial deviation. no synovitis or effusions.   Psychiatric: he has a min dysphoric mood and affect. (improved since last OV). behavior is normal. Judgment and thought content normal.   Lab Results  Component Value Date   WBC 9.7 02/03/2013   HGB 13.7 02/03/2013   HCT 38.7* 02/03/2013   PLT 156 02/03/2013    GLUCOSE 75 02/03/2013   CHOL 120 08/03/2012   TRIG 106 08/03/2012   HDL 31* 08/03/2012   LDLCALC 68 08/03/2012   ALT 26 02/03/2013   AST 29 02/03/2013   NA 139 02/03/2013   K 4.4 02/03/2013   CL 103 02/03/2013   CREATININE 0.85 02/03/2013   BUN 17 02/03/2013   CO2 31 02/03/2013   TSH 1.04 06/22/2012   INR 4.0 04/05/2013   HGBA1C 4.9 06/22/2012        Assessment & Plan:   See problem list. Medications and labs reviewed today.  Allergic sinusitis. No evidence for infection. Add nasal steroid and daily antihistamine during problematic season. Patient to call if fever, increasing symptoms or pain

## 2013-04-22 ENCOUNTER — Ambulatory Visit (INDEPENDENT_AMBULATORY_CARE_PROVIDER_SITE_OTHER): Payer: Medicare Other | Admitting: General Practice

## 2013-04-22 LAB — POCT INR: INR: 3.9

## 2013-04-22 NOTE — Progress Notes (Signed)
Pre-visit discussion using our clinic review tool. No additional management support is needed unless otherwise documented below in the visit note.  

## 2013-04-26 ENCOUNTER — Encounter (HOSPITAL_COMMUNITY): Payer: Self-pay | Admitting: Psychiatry

## 2013-04-26 ENCOUNTER — Ambulatory Visit (INDEPENDENT_AMBULATORY_CARE_PROVIDER_SITE_OTHER): Payer: Medicare Other | Admitting: Psychiatry

## 2013-04-26 VITALS — BP 120/78 | Ht 67.0 in | Wt 191.0 lb

## 2013-04-26 DIAGNOSIS — F332 Major depressive disorder, recurrent severe without psychotic features: Secondary | ICD-10-CM

## 2013-04-26 DIAGNOSIS — F329 Major depressive disorder, single episode, unspecified: Secondary | ICD-10-CM

## 2013-04-26 DIAGNOSIS — F32A Depression, unspecified: Secondary | ICD-10-CM

## 2013-04-26 MED ORDER — ESCITALOPRAM OXALATE 10 MG PO TABS: 10.0000 mg | ORAL_TABLET | Freq: Every day | ORAL | Status: DC
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
## 2013-05-06 ENCOUNTER — Ambulatory Visit (INDEPENDENT_AMBULATORY_CARE_PROVIDER_SITE_OTHER): Payer: Medicare Other | Admitting: General Practice

## 2013-05-06 LAB — POCT INR: INR: 2

## 2013-05-06 NOTE — Progress Notes (Signed)
Pre-visit discussion using our clinic review tool. No additional management support is needed unless otherwise documented below in the visit note.  

## 2013-05-13 ENCOUNTER — Telehealth: Payer: Self-pay | Admitting: *Deleted

## 2013-05-13 ENCOUNTER — Ambulatory Visit (INDEPENDENT_AMBULATORY_CARE_PROVIDER_SITE_OTHER): Payer: Medicare Other | Admitting: Psychiatry

## 2013-05-13 DIAGNOSIS — F339 Major depressive disorder, recurrent, unspecified: Secondary | ICD-10-CM

## 2013-05-13 NOTE — Telephone Encounter (Signed)
error 

## 2013-05-13 NOTE — Patient Instructions (Signed)
Discussed orally 
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
## 2013-05-19 ENCOUNTER — Other Ambulatory Visit (HOSPITAL_BASED_OUTPATIENT_CLINIC_OR_DEPARTMENT_OTHER): Payer: Medicare Other

## 2013-05-19 ENCOUNTER — Encounter (HOSPITAL_COMMUNITY): Payer: Self-pay

## 2013-05-19 ENCOUNTER — Ambulatory Visit (HOSPITAL_COMMUNITY)
Admission: RE | Admit: 2013-05-19 | Discharge: 2013-05-19 | Disposition: A | Discharge status: Home / Self Care | Payer: Medicare Other | Source: Ambulatory Visit | Attending: Oncology | Admitting: Oncology

## 2013-05-19 DIAGNOSIS — C9110 Chronic lymphocytic leukemia of B-cell type not having achieved remission: Secondary | ICD-10-CM | POA: Diagnosis not present

## 2013-05-19 DIAGNOSIS — R599 Enlarged lymph nodes, unspecified: Secondary | ICD-10-CM | POA: Insufficient documentation

## 2013-05-19 DIAGNOSIS — Z981 Arthrodesis status: Secondary | ICD-10-CM | POA: Insufficient documentation

## 2013-05-19 DIAGNOSIS — K5730 Diverticulosis of large intestine without perforation or abscess without bleeding: Secondary | ICD-10-CM | POA: Insufficient documentation

## 2013-05-19 DIAGNOSIS — K449 Diaphragmatic hernia without obstruction or gangrene: Secondary | ICD-10-CM | POA: Diagnosis not present

## 2013-05-19 LAB — COMPREHENSIVE METABOLIC PANEL (CC13)
ALT: 22 U/L (ref 0–55)
AST: 25 U/L (ref 5–34)
Albumin: 4.1 g/dL (ref 3.5–5.0)
Alkaline Phosphatase: 82 U/L (ref 40–150)
Anion Gap: 9 mEq/L (ref 3–11)
BUN: 20.3 mg/dL (ref 7.0–26.0)
CO2: 28 mEq/L (ref 22–29)
Calcium: 8.9 mg/dL (ref 8.4–10.4)
Chloride: 104 mEq/L (ref 98–109)
Creatinine: 0.9 mg/dL (ref 0.7–1.3)
Glucose: 85 mg/dl (ref 70–140)
Potassium: 4.3 mEq/L (ref 3.5–5.1)
Sodium: 142 mEq/L (ref 136–145)
Total Bilirubin: 0.91 mg/dL (ref 0.20–1.20)
Total Protein: 6.4 g/dL (ref 6.4–8.3)

## 2013-05-19 LAB — CBC WITH DIFFERENTIAL/PLATELET
BASO%: 0.3 % (ref 0.0–2.0)
Basophils Absolute: 0 10*3/uL (ref 0.0–0.1)
EOS%: 1.4 % (ref 0.0–7.0)
Eosinophils Absolute: 0.1 10*3/uL (ref 0.0–0.5)
HCT: 44 % (ref 38.4–49.9)
HGB: 14.7 g/dL (ref 13.0–17.1)
LYMPH%: 70.1 % — ABNORMAL HIGH (ref 14.0–49.0)
MCH: 39.6 pg — ABNORMAL HIGH (ref 27.2–33.4)
MCHC: 33.4 g/dL (ref 32.0–36.0)
MCV: 118.6 fL — ABNORMAL HIGH (ref 79.3–98.0)
MONO#: 0.9 10*3/uL (ref 0.1–0.9)
MONO%: 9.7 % (ref 0.0–14.0)
NEUT#: 1.7 10*3/uL (ref 1.5–6.5)
NEUT%: 18.5 % — ABNORMAL LOW (ref 39.0–75.0)
Platelets: 130 10*3/uL — ABNORMAL LOW (ref 140–400)
RBC: 3.71 10*6/uL — ABNORMAL LOW (ref 4.20–5.82)
RDW: 13.5 % (ref 11.0–14.6)
WBC: 9.4 10*3/uL (ref 4.0–10.3)
lymph#: 6.6 10*3/uL — ABNORMAL HIGH (ref 0.9–3.3)

## 2013-05-19 LAB — TECHNOLOGIST REVIEW

## 2013-05-19 MED ORDER — IOHEXOL 300 MG/ML  SOLN
100.0000 mL | Freq: Once | INTRAMUSCULAR | Status: AC | PRN
  Administered 2013-05-19: 100 mL via INTRAVENOUS

## 2013-05-20 ENCOUNTER — Ambulatory Visit (INDEPENDENT_AMBULATORY_CARE_PROVIDER_SITE_OTHER): Payer: Medicare Other | Admitting: General Practice

## 2013-05-20 DIAGNOSIS — Z7901 Long term (current) use of anticoagulants: Secondary | ICD-10-CM | POA: Diagnosis not present

## 2013-05-20 DIAGNOSIS — I749 Embolism and thrombosis of unspecified artery: Secondary | ICD-10-CM | POA: Diagnosis not present

## 2013-05-20 DIAGNOSIS — I82409 Acute embolism and thrombosis of unspecified deep veins of unspecified lower extremity: Secondary | ICD-10-CM | POA: Diagnosis not present

## 2013-05-20 LAB — POCT INR: INR: 3.4

## 2013-05-20 NOTE — Progress Notes (Signed)
Pre-visit discussion using our clinic review tool. No additional management support is needed unless otherwise documented below in the visit note.  

## 2013-05-26 ENCOUNTER — Ambulatory Visit (INDEPENDENT_AMBULATORY_CARE_PROVIDER_SITE_OTHER): Payer: Medicare Other | Admitting: Psychiatry

## 2013-05-26 ENCOUNTER — Encounter: Payer: Self-pay | Admitting: Oncology

## 2013-05-26 ENCOUNTER — Encounter (HOSPITAL_COMMUNITY): Payer: Self-pay | Admitting: Psychiatry

## 2013-05-26 ENCOUNTER — Ambulatory Visit (HOSPITAL_BASED_OUTPATIENT_CLINIC_OR_DEPARTMENT_OTHER): Payer: Medicare Other | Admitting: Oncology

## 2013-05-26 ENCOUNTER — Telehealth: Payer: Self-pay | Admitting: Oncology

## 2013-05-26 VITALS — BP 135/79 | HR 62 | Temp 97.2°F | Resp 16 | Wt 194.1 lb

## 2013-05-26 VITALS — BP 128/82 | Ht 67.0 in | Wt 193.0 lb

## 2013-05-26 DIAGNOSIS — D7589 Other specified diseases of blood and blood-forming organs: Secondary | ICD-10-CM | POA: Diagnosis not present

## 2013-05-26 DIAGNOSIS — B20 Human immunodeficiency virus [HIV] disease: Secondary | ICD-10-CM

## 2013-05-26 DIAGNOSIS — F332 Major depressive disorder, recurrent severe without psychotic features: Secondary | ICD-10-CM | POA: Diagnosis not present

## 2013-05-26 DIAGNOSIS — C9111 Chronic lymphocytic leukemia of B-cell type in remission: Secondary | ICD-10-CM | POA: Diagnosis not present

## 2013-05-26 DIAGNOSIS — F339 Major depressive disorder, recurrent, unspecified: Secondary | ICD-10-CM

## 2013-05-26 DIAGNOSIS — C9110 Chronic lymphocytic leukemia of B-cell type not having achieved remission: Secondary | ICD-10-CM

## 2013-05-26 MED ORDER — CLONAZEPAM 0.5 MG PO TABS: ORAL_TABLET | ORAL | Status: DC

## 2013-05-26 MED ORDER — ESCITALOPRAM OXALATE 10 MG PO TABS: 10.0000 mg | ORAL_TABLET | Freq: Every day | ORAL | Status: DC

## 2013-05-26 NOTE — Progress Notes (Signed)
Hematology and Oncology Follow Up Visit  [name] [id number] 06/06/1950 63 y.o. 05/26/2013 10:34 AM [name], MDLeschber, Christopher Rodney, MD   Principle Diagnosis: 63 year old gentleman with CLL diagnosed in 2007 at [facility].  Current therapy: Observation and surveillance.  Interim History:  This is a pleasant 63 year old gentleman presents today for a followup visit. I initially evaluated him back in July of 2014 after he established care here in this area. Since his last visit, he has not reported any symptoms and continue to be completely asymptomatic from his CLL. He has not reported any fevers or chills or sweats. Has not reported any cough or hemoptysis or hematemesis. Has not reported any weight loss her early satiety. Has not reported any hospitalizations or illnesses.  Medications: I have reviewed the patient's current medications.  Current Outpatient Prescriptions  Medication Sig Dispense Refill  . aspirin EC 81 MG tablet Take 81 mg by mouth daily.      [name] atorvastatin (LIPITOR) 10 MG tablet Take 10 mg by mouth at bedtime.      . B Complex-C (B-COMPLEX WITH VITAMIN C) tablet Take 1 tablet by mouth daily.      . Calcium Carb-Cholecalciferol (CALCIUM 1000 + D PO) Take 1 tablet by mouth daily.      . clonazePAM (KLONOPIN) 0.5 MG tablet Take one daily as needed for anxiety  60 tablet  2  . Darunavir Ethanolate (PREZISTA) 800 MG tablet Take 1 tablet (800 mg total) by mouth daily.  30 tablet  5  . EPINEPHrine (EPI-PEN) 0.3 mg/0.3 mL SOAJ Inject 0.3 mg into the muscle once. As needed for allergic reaction      . escitalopram (LEXAPRO) 10 MG tablet Take 1 tablet (10 mg total) by mouth daily.  30 tablet  2  . inFLIXimab (REMICADE) 100 MG injection Inject into the vein.      . ISENTRESS 400 MG tablet TAKE 1 TABLET BY MOUTH TWICE A DAY  60 tablet  3  . lamiVUDine-zidovudine (COMBIVIR) 150-300 MG per tablet Take 1 tablet by mouth 2 (two) times daily.      [name]  loratadine (CLARITIN) 10 MG tablet Take 1 tablet (10 mg total) by mouth daily.  30 tablet  11  . metoprolol succinate (TOPROL-XL) 25 MG 24 hr tablet Take 25 mg by mouth at bedtime.      . mometasone (NASONEX) 50 MCG/ACT nasal spray Place 2 sprays into the nose daily.  17 g  12  . NORVIR 100 MG TABS tablet TAKE 1 TABLET BY MOUTH EVERY DAY  90 tablet  3  . pantoprazole (PROTONIX) 40 MG tablet Take two tablets by mouth daily  60 tablet  4  . polyethylene glycol powder (GLYCOLAX/MIRALAX) powder One scoop daily as needed for constipation.  255 g  3  . pregabalin (LYRICA) 200 MG capsule Take 400 mg by mouth 2 (two) times daily.      [name] saccharomyces boulardii (FLORASTOR) 250 MG capsule Take 1 capsule (250 mg total) by mouth 2 (two) times daily.  60 capsule  3  . tadalafil (CIALIS) 20 MG tablet Take 20 mg by mouth daily as needed for erectile dysfunction.      . valACYclovir (VALTREX) 500 MG tablet Take 1 tablet (500 mg total) by mouth daily.  30 tablet  3  . warfarin (COUMADIN) 10 MG tablet Take as directed by anticoagulation clinic  30 tablet  2   No current facility-administered medications for this visit.  Allergies:  Allergies  Allergen Reactions  . Other Anaphylaxis and Hives    Pecan  . Morphine     REACTION: severe headache  . Oxycodone-Acetaminophen     REACTION: headache  . Peanut-Containing Drug Products Hives    Swelling of throat  . Penicillins     REACTION: red, flushed  . Promethazine Hcl     REACTION: makes him feel drunk    Past Medical History, Surgical history, Social history, and Family History were reviewed and updated.  Review of Systems: Constitutional:  Negative for fever, chills, night sweats, anorexia, weight loss, pain.  Remaining ROS negative. Physical Exam: There were no vitals taken for this visit. ECOG: 1 General appearance: alert Head: Normocephalic, without obvious abnormality, atraumatic Neck: no adenopathy, no carotid bruit, no JVD, supple,  symmetrical, trachea midline and thyroid not enlarged, symmetric, no tenderness/mass/nodules Lymph nodes: Cervical, supraclavicular, and axillary nodes normal. Heart:regular rate and rhythm, S1, S2 normal, no murmur, click, rub or gallop Lung:chest clear, no wheezing, rales, normal symmetric air entry Abdomin: soft, non-tender, without masses or organomegaly EXT:no erythema, induration, or nodules   Lab Results: Lab Results  Component Value Date   WBC 9.4 05/19/2013   HGB 14.7 05/19/2013   HCT 44.0 05/19/2013   MCV 118.6* 05/19/2013   PLT 130* 05/19/2013     Chemistry      Component Value Date/Time   NA 142 05/19/2013 0916   NA 139 02/03/2013 1103   K 4.3 05/19/2013 0916   K 4.4 02/03/2013 1103   CL 103 02/03/2013 1103   CO2 28 05/19/2013 0916   CO2 31 02/03/2013 1103   BUN 20.3 05/19/2013 0916   BUN 17 02/03/2013 1103   CREATININE 0.9 05/19/2013 0916   CREATININE 0.85 02/03/2013 1103   CREATININE 0.72 07/02/2012 1757      Component Value Date/Time   CALCIUM 8.9 05/19/2013 0916   CALCIUM 8.8 02/03/2013 1103   ALKPHOS 82 05/19/2013 0916   ALKPHOS 79 02/03/2013 1103   AST 25 05/19/2013 0916   AST 29 02/03/2013 1103   ALT 22 05/19/2013 0916   ALT 26 02/03/2013 1103   BILITOT 0.91 05/19/2013 0916   BILITOT 0.8 02/03/2013 1103     EXAM:  CT CHEST, ABDOMEN, AND PELVIS WITH CONTRAST  TECHNIQUE:  Multidetector CT imaging of the chest, abdomen and pelvis was  performed following the standard protocol during bolus  administration of intravenous contrast.  CONTRAST: 112mL OMNIPAQUE IOHEXOL 300 MG/ML SOLN  COMPARISON: None  FINDINGS:  CT CHEST FINDINGS  No evidence of mediastinal or hilar masses. No pathologically  enlarged lymph nodes identified. Calcified mediastinal and hilar  lymph nodes are seen, consistent with old granulomatous disease. A  small to moderate hiatal hernia is seen.  No evidence of pleural or pericardial effusion. No suspicious  pulmonary nodules or masses are identified. No evidence of  pulmonary  infiltrate or central endobronchial lesion.  CT ABDOMEN AND PELVIS FINDINGS  No evidence of splenomegaly or splenic masses. The liver, pancreas,  adrenal glands, and kidneys are normal in appearance. No evidence  hydronephrosis.  Retroperitoneal lymphadenopathy is seen in the left paraaortic and  aortocaval space is with index lymph node in the left paraaortic  region measuring 2.0 x 2.7 cm on image 70. Shotty less than 1 cm  lymph nodes seen in the external iliac chains bilaterally, but no  pathologically enlarged nodes seen within the pelvis.  Sigmoid diverticulosis is noted, however there is no evidence of  diverticulitis.  No other inflammatory process or abnormal fluid  collections identified. No suspicious bone lesions identified.  Advanced thoracolumbar spine degenerative changes and previous  lumbar spine fusion noted.  IMPRESSION:  Mild abdominal retroperitoneal lymphadenopathy. Shotty subcentimeter  lymph nodes in bilateral external iliac chains.  Small to moderate hiatal hernia.  Diverticulosis. No radiographic evidence of diverticulitis.     Impression and Plan:  63 year old gentleman with the following issues:  1. CLL diagnosed in 2007 presented with stage 0 CD38 positive, ZAP 70 positive. He did have some mild lymphadenopathy on CT scan that is mostly subcentimeter with a aortocaval lymph node largest measuring 2.0 x 1.6 cm. followup CT scan showed very little to no change in his lymphadenopathy and his completely asymptomatic. The plan is to continue with observation and surveillance and follow up in 6 months. We'll repeat imaging studies in about 12 months.  2. Macrocytosis: As undoubtedly related to his HIV medications I will continue to observe that no intervention is needed.   [facility], MD 1/15/201510:34 AM
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
## 2013-05-26 NOTE — Telephone Encounter (Signed)
Gave pt appt for lab and Md on July 2015 °

## 2013-05-27 ENCOUNTER — Ambulatory Visit (HOSPITAL_COMMUNITY): Payer: Self-pay | Admitting: Psychiatry

## 2013-06-01 ENCOUNTER — Ambulatory Visit (HOSPITAL_COMMUNITY): Payer: Self-pay | Admitting: Psychiatry

## 2013-06-02 DIAGNOSIS — M069 Rheumatoid arthritis, unspecified: Secondary | ICD-10-CM | POA: Diagnosis not present

## 2013-06-06 ENCOUNTER — Ambulatory Visit (INDEPENDENT_AMBULATORY_CARE_PROVIDER_SITE_OTHER): Payer: Medicare Other | Admitting: General Practice

## 2013-06-06 DIAGNOSIS — I82409 Acute embolism and thrombosis of unspecified deep veins of unspecified lower extremity: Secondary | ICD-10-CM

## 2013-06-06 DIAGNOSIS — Z5181 Encounter for therapeutic drug level monitoring: Secondary | ICD-10-CM

## 2013-06-06 DIAGNOSIS — Z7901 Long term (current) use of anticoagulants: Secondary | ICD-10-CM

## 2013-06-06 LAB — POCT INR: INR: 2

## 2013-06-06 NOTE — Progress Notes (Signed)
Pre-visit discussion using our clinic review tool. No additional management support is needed unless otherwise documented below in the visit note.  

## 2013-06-09 ENCOUNTER — Ambulatory Visit (HOSPITAL_COMMUNITY): Payer: Self-pay | Admitting: Psychiatry

## 2013-06-09 DIAGNOSIS — H2510 Age-related nuclear cataract, unspecified eye: Secondary | ICD-10-CM | POA: Diagnosis not present

## 2013-06-09 DIAGNOSIS — H538 Other visual disturbances: Secondary | ICD-10-CM | POA: Diagnosis not present

## 2013-06-09 LAB — HM DIABETES EYE EXAM

## 2013-06-10 ENCOUNTER — Ambulatory Visit (HOSPITAL_COMMUNITY): Payer: Self-pay | Admitting: Psychology

## 2013-06-16 ENCOUNTER — Encounter: Payer: Self-pay | Admitting: Internal Medicine

## 2013-06-21 ENCOUNTER — Ambulatory Visit (INDEPENDENT_AMBULATORY_CARE_PROVIDER_SITE_OTHER): Payer: Medicare Other | Admitting: General Practice

## 2013-06-21 LAB — POCT INR: INR: 3

## 2013-06-21 NOTE — Progress Notes (Signed)
Pre-visit discussion using our clinic review tool. No additional management support is needed unless otherwise documented below in the visit note.  

## 2013-06-22 ENCOUNTER — Other Ambulatory Visit: Payer: Self-pay | Admitting: *Deleted

## 2013-06-22 DIAGNOSIS — B20 Human immunodeficiency virus [HIV] disease: Secondary | ICD-10-CM

## 2013-06-22 MED ORDER — RITONAVIR 100 MG PO TABS
ORAL_TABLET | ORAL | Status: DC
Start: 2013-06-22 — End: 2013-12-19

## 2013-06-22 MED ORDER — LAMIVUDINE-ZIDOVUDINE 150-300 MG PO TABS: 1.0000 | ORAL_TABLET | Freq: Two times a day (BID) | ORAL | Status: DC

## 2013-06-22 MED ORDER — RALTEGRAVIR POTASSIUM 400 MG PO TABS: ORAL_TABLET | ORAL | Status: DC

## 2013-06-22 MED ORDER — DARUNAVIR ETHANOLATE 800 MG PO TABS: 800.0000 mg | ORAL_TABLET | Freq: Every day | ORAL | Status: DC

## 2013-06-23 ENCOUNTER — Other Ambulatory Visit: Payer: Medicare Other

## 2013-06-23 ENCOUNTER — Ambulatory Visit (INDEPENDENT_AMBULATORY_CARE_PROVIDER_SITE_OTHER): Payer: Medicare Other | Admitting: Psychology

## 2013-06-23 DIAGNOSIS — E1149 Type 2 diabetes mellitus with other diabetic neurological complication: Secondary | ICD-10-CM | POA: Diagnosis not present

## 2013-06-23 DIAGNOSIS — B351 Tinea unguium: Secondary | ICD-10-CM | POA: Diagnosis not present

## 2013-06-23 DIAGNOSIS — L851 Acquired keratosis [keratoderma] palmaris et plantaris: Secondary | ICD-10-CM | POA: Diagnosis not present

## 2013-06-23 DIAGNOSIS — F339 Major depressive disorder, recurrent, unspecified: Secondary | ICD-10-CM

## 2013-06-23 DIAGNOSIS — B20 Human immunodeficiency virus [HIV] disease: Secondary | ICD-10-CM | POA: Diagnosis not present

## 2013-06-23 LAB — COMPLETE METABOLIC PANEL WITH GFR
ALT: 30 U/L (ref 0–53)
AST: 39 U/L — ABNORMAL HIGH (ref 0–37)
Albumin: 3.9 g/dL (ref 3.5–5.2)
Alkaline Phosphatase: 73 U/L (ref 39–117)
BUN: 17 mg/dL (ref 6–23)
CO2: 29 mEq/L (ref 19–32)
Calcium: 8.9 mg/dL (ref 8.4–10.5)
Chloride: 101 mEq/L (ref 96–112)
Creat: 0.86 mg/dL (ref 0.50–1.35)
GFR, Est African American: >89 mL/min
GFR, Est Non African American: >89 mL/min
Glucose, Bld: 68 mg/dL — ABNORMAL LOW (ref 70–99)
Potassium: 4.4 mEq/L (ref 3.5–5.3)
Sodium: 136 mEq/L (ref 135–145)
Total Bilirubin: 0.8 mg/dL (ref 0.2–1.2)
Total Protein: 5.8 g/dL — ABNORMAL LOW (ref 6.0–8.3)

## 2013-06-24 LAB — CBC WITH DIFFERENTIAL/PLATELET
Basophils Absolute: 0 10*3/uL (ref 0.0–0.1)
Basophils Relative: 0 % (ref 0–1)
Eosinophils Absolute: 0.1 10*3/uL (ref 0.0–0.7)
Eosinophils Relative: 1 % (ref 0–5)
HCT: 40.3 % (ref 39.0–52.0)
Hemoglobin: 14.1 g/dL (ref 13.0–17.0)
Lymphocytes Relative: 69 % — ABNORMAL HIGH (ref 12–46)
Lymphs Abs: 5.8 10*3/uL — ABNORMAL HIGH (ref 0.7–4.0)
MCH: 38.6 pg — ABNORMAL HIGH (ref 26.0–34.0)
MCHC: 35 g/dL (ref 30.0–36.0)
MCV: 110.4 fL — ABNORMAL HIGH (ref 78.0–100.0)
Monocytes Absolute: 1 10*3/uL (ref 0.1–1.0)
Monocytes Relative: 12 % (ref 3–12)
Neutro Abs: 1.5 10*3/uL — ABNORMAL LOW (ref 1.7–7.7)
Neutrophils Relative %: 18 % — ABNORMAL LOW (ref 43–77)
Platelets: 133 10*3/uL — ABNORMAL LOW (ref 150–400)
RBC: 3.65 MIL/uL — ABNORMAL LOW (ref 4.22–5.81)
RDW: 14.5 % (ref 11.5–15.5)
WBC: 8.4 10*3/uL (ref 4.0–10.5)

## 2013-06-24 LAB — T-HELPER CELL (CD4) - (RCID CLINIC ONLY)
CD4 % Helper T Cell: 17 % — ABNORMAL LOW (ref 33–55)
CD4 T Cell Abs: 930 /uL (ref 400–2700)

## 2013-06-25 ENCOUNTER — Other Ambulatory Visit: Payer: Self-pay | Admitting: Internal Medicine

## 2013-06-27 ENCOUNTER — Other Ambulatory Visit: Payer: Self-pay | Admitting: *Deleted

## 2013-06-27 DIAGNOSIS — M549 Dorsalgia, unspecified: Principal | ICD-10-CM

## 2013-06-27 DIAGNOSIS — G8929 Other chronic pain: Secondary | ICD-10-CM

## 2013-06-27 MED ORDER — PREGABALIN 200 MG PO CAPS: 400.0000 mg | ORAL_CAPSULE | Freq: Two times a day (BID) | ORAL | Status: DC

## 2013-06-29 ENCOUNTER — Encounter (HOSPITAL_COMMUNITY): Payer: Self-pay | Admitting: Psychology
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
## 2013-06-30 LAB — HIV-1 RNA QUANT-NO REFLEX-BLD
HIV 1 RNA Quant: <20 copies/mL (ref <20)
HIV-1 RNA Quant, Log: <1.3 {Log} (ref <1.30)

## 2013-07-07 ENCOUNTER — Ambulatory Visit: Payer: Medicare Other | Admitting: Internal Medicine

## 2013-07-08 ENCOUNTER — Encounter: Payer: Self-pay | Admitting: Internal Medicine

## 2013-07-08 ENCOUNTER — Ambulatory Visit (INDEPENDENT_AMBULATORY_CARE_PROVIDER_SITE_OTHER): Payer: Medicare Other | Admitting: Psychology

## 2013-07-08 ENCOUNTER — Ambulatory Visit (INDEPENDENT_AMBULATORY_CARE_PROVIDER_SITE_OTHER): Payer: Medicare Other | Admitting: General Practice

## 2013-07-08 ENCOUNTER — Encounter (HOSPITAL_COMMUNITY): Payer: Self-pay | Admitting: Psychology

## 2013-07-08 ENCOUNTER — Other Ambulatory Visit (INDEPENDENT_AMBULATORY_CARE_PROVIDER_SITE_OTHER): Payer: Medicare Other

## 2013-07-08 ENCOUNTER — Ambulatory Visit (INDEPENDENT_AMBULATORY_CARE_PROVIDER_SITE_OTHER): Payer: Medicare Other | Admitting: Internal Medicine

## 2013-07-08 VITALS — BP 118/70 | HR 64 | Temp 97.0°F | Wt 190.8 lb

## 2013-07-08 DIAGNOSIS — N529 Male erectile dysfunction, unspecified: Secondary | ICD-10-CM | POA: Diagnosis not present

## 2013-07-08 DIAGNOSIS — G8929 Other chronic pain: Secondary | ICD-10-CM

## 2013-07-08 DIAGNOSIS — R7309 Other abnormal glucose: Secondary | ICD-10-CM

## 2013-07-08 DIAGNOSIS — F339 Major depressive disorder, recurrent, unspecified: Secondary | ICD-10-CM

## 2013-07-08 DIAGNOSIS — E785 Hyperlipidemia, unspecified: Secondary | ICD-10-CM

## 2013-07-08 DIAGNOSIS — M549 Dorsalgia, unspecified: Secondary | ICD-10-CM

## 2013-07-08 LAB — LIPID PANEL
Cholesterol: 135 mg/dL (ref 0–200)
HDL: 39.1 mg/dL (ref >39.00)
LDL Cholesterol: 68 mg/dL (ref 0–99)
Total CHOL/HDL Ratio: 3
Triglycerides: 141 mg/dL (ref 0.0–149.0)
VLDL: 28.2 mg/dL (ref 0.0–40.0)

## 2013-07-08 LAB — POCT INR: INR: 2.4

## 2013-07-08 LAB — HEMOGLOBIN A1C: Hgb A1c MFr Bld: 5 % (ref 4.6–6.5)

## 2013-07-08 MED ORDER — PREGABALIN 200 MG PO CAPS: 200.0000 mg | ORAL_CAPSULE | Freq: Two times a day (BID) | ORAL | Status: DC

## 2013-07-08 MED ORDER — METFORMIN HCL ER 500 MG PO TB24: 500.0000 mg | ORAL_TABLET | Freq: Every day | ORAL | Status: DC

## 2013-07-08 MED ORDER — TADALAFIL 20 MG PO TABS: 20.0000 mg | ORAL_TABLET | Freq: Every day | ORAL | Status: DC | PRN

## 2013-07-08 NOTE — Assessment & Plan Note (Signed)
Uses Cialis prn - refill provided Verified no anginal symptoms

## 2013-07-08 NOTE — Progress Notes (Signed)
Pre visit review using our clinic review tool, if applicable. No additional management support is needed unless otherwise documented below in the visit note. 

## 2013-07-08 NOTE — Assessment & Plan Note (Signed)
Changed from crestor to atorva 05/2011 due to cost Low dose atorva due to interaction with HIV meds Check lipids annually - cards to adjust to as needed (follow up next month planned)

## 2013-07-08 NOTE — Patient Instructions (Signed)
It was good to see you today.  We have reviewed your prior records including labs and tests today  Test(s) ordered today. Your results will be released to [place] (or called to you) after review, usually within 72hours after test completion. If any changes need to be made, you will be notified at that same time.  Medications reviewed and updated, refill on Cialis and resume metformin No other changes recommended at this time.  Please schedule followup in 4-6 months, call sooner if problems.

## 2013-07-08 NOTE — Assessment & Plan Note (Signed)
prev metformin for variable insulin metabolism - high and low cbgs but off since 08/2011 as "not diabetic" per a1c <5 S/p endo eval fall 2013 Check a1c now, reconsider tx as needed Lab Results  Component Value Date   HGBA1C 4.9 06/22/2012
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
## 2013-07-08 NOTE — Assessment & Plan Note (Signed)
Following with neurosurgery and physiatry for same Reviewed interval events, status post recent ejection left-sided lower back.  Okay for back brace as needed for support to control stable his pain

## 2013-07-08 NOTE — Progress Notes (Signed)
Subjective:    Patient ID: [name], male    DOB: 03-11-1951, 63 y.o.   MRN: [id number]  HPI  Patient here for follow up Reviewed chronic medical issues and interval medical events  Past Medical History  Diagnosis Date  . Osteoarthritis, knee     s/p B TKA  . HIV infection dx 1993  . TIA (transient ischemic attack) 1997    mild residual L mouth droop  . H/O hiatal hernia 2008    surgery  . Gynecomastia, male   . Impotence of organic origin   . Rheumatoid arthritis(714.0) dx 2010    MTX, follows with rheum  . Gout   . Chronic back pain     follows with Nsurg  . Coronary artery disease 2010    s/p CABG '10, sees Dr. Percival Spanish  . Seasonal allergies   . Diverticulosis   . Gallstones   . Status post dilation of esophageal narrowing   . GERD (gastroesophageal reflux disease)   . Diverticulosis   . Esophagitis   . Hemorrhoids   . Tubular adenoma of colon   . Myocardial infarction 2010     x 2  . Hypertension   . DVT, lower extremity, recurrent 2008, 2009    LLE, chronic anticoag since 2009  . Carotid artery occlusion     40-60% right ICA stenosis (09/2008)  . Neuromuscular disorder     neuropathy  . Fibromyalgia   . Hepatitis A yrs ago  . PONV (postoperative nausea and vomiting)   . HIV (human immunodeficiency virus infection)   . CLL (chronic lymphoblastic leukemia) dx 2010    Followed at [facility] q74[facility], no current therapy     Review of Systems  Constitutional: Positive for fatigue.  Respiratory: Positive for chest tightness and shortness of breath (with any exertion). Negative for cough.   Cardiovascular: Negative for chest pain, palpitations and leg swelling.       Objective:   Physical Exam  BP 118/70  Pulse 64  Temp(Src) 97 F (36.1 C) (Oral)  Wt 190 lb 12.8 oz (86.546 kg)  SpO2 96% Wt Readings from Last 3 Encounters:  07/08/13 190 lb 12.8 oz (86.546 kg)  05/26/13 194 lb 1 oz (88.026 kg)  05/26/13 193 lb (87.544 kg)   Constitutional:  He is  frail, but appears well-developed and well-nourished. No distress.  Neck: Normal range of motion. Neck supple. No JVD or LAD present. No thyromegaly present.  Cardiovascular: Normal rate, regular rhythm and normal heart sounds.  No murmur heard. no BLE edema Pulmonary/Chest: Effort normal and breath sounds normal. No respiratory distress. no wheezes. Musculoskeletal: spine deformity with prominence of lumbar rod at lower T level and "bony tumor" mid T spine. tender to direct palpation - gait is forward bent (90 degrees+) at hips, aided with cane. B knees s/p TKR, no effusion - B hands with MCP radial deviation. no synovitis or effusions.   Psychiatric: he has a normal mood and affect. behavior is normal. Judgment and thought content normal.   Lab Results  Component Value Date   WBC 8.4 06/23/2013   HGB 14.1 06/23/2013   HCT 40.3 06/23/2013   PLT 133* 06/23/2013   GLUCOSE 68* 06/23/2013   CHOL 120 08/03/2012   TRIG 106 08/03/2012   HDL 31* 08/03/2012   LDLCALC 68 08/03/2012   ALT 30 06/23/2013   AST 39* 06/23/2013   NA 136 06/23/2013   K 4.4 06/23/2013   CL 101 06/23/2013  CREATININE 0.86 06/23/2013   BUN 17 06/23/2013   CO2 29 06/23/2013   TSH 1.04 06/22/2012   INR 2.4 07/08/2013   HGBA1C 4.9 06/22/2012    Ct Chest W Contrast  05/19/2013   CLINICAL DATA:  Chronic lymphocytic leukemia.  EXAM: CT CHEST, ABDOMEN, AND PELVIS WITH CONTRAST  TECHNIQUE: Multidetector CT imaging of the chest, abdomen and pelvis was performed following the standard protocol during bolus administration of intravenous contrast.  CONTRAST:  170mL OMNIPAQUE IOHEXOL 300 MG/ML  SOLN  COMPARISON:  None  FINDINGS: CT CHEST FINDINGS  No evidence of mediastinal or hilar masses. No pathologically enlarged lymph nodes identified. Calcified mediastinal and hilar lymph nodes are seen, consistent with old granulomatous disease. A small to moderate hiatal hernia is seen.  No evidence of pleural or pericardial effusion. No suspicious pulmonary  nodules or masses are identified. No evidence of pulmonary infiltrate or central endobronchial lesion.  CT ABDOMEN AND PELVIS FINDINGS  No evidence of splenomegaly or splenic masses. The liver, pancreas, adrenal glands, and kidneys are normal in appearance. No evidence hydronephrosis.  Retroperitoneal lymphadenopathy is seen in the left paraaortic and aortocaval space is with index lymph node in the left paraaortic region measuring 2.0 x 2.7 cm on image 70. Shotty less than 1 cm lymph nodes seen in the external iliac chains bilaterally, but no pathologically enlarged nodes seen within the pelvis.  Sigmoid diverticulosis is noted, however there is no evidence of diverticulitis. No other inflammatory process or abnormal fluid collections identified. No suspicious bone lesions identified. Advanced thoracolumbar spine degenerative changes and previous lumbar spine fusion noted.  IMPRESSION: Mild abdominal retroperitoneal lymphadenopathy. Shotty subcentimeter lymph nodes in bilateral external iliac chains.  Small to moderate hiatal hernia.  Diverticulosis. No radiographic evidence of diverticulitis.   Electronically Signed   By: Earle Gell M.D.   On: 05/19/2013 11:22   Ct Abdomen Pelvis W Contrast  05/19/2013   CLINICAL DATA:  Chronic lymphocytic leukemia.  EXAM: CT CHEST, ABDOMEN, AND PELVIS WITH CONTRAST  TECHNIQUE: Multidetector CT imaging of the chest, abdomen and pelvis was performed following the standard protocol during bolus administration of intravenous contrast.  CONTRAST:  190mL OMNIPAQUE IOHEXOL 300 MG/ML  SOLN  COMPARISON:  None  FINDINGS: CT CHEST FINDINGS  No evidence of mediastinal or hilar masses. No pathologically enlarged lymph nodes identified. Calcified mediastinal and hilar lymph nodes are seen, consistent with old granulomatous disease. A small to moderate hiatal hernia is seen.  No evidence of pleural or pericardial effusion. No suspicious pulmonary nodules or masses are identified. No evidence  of pulmonary infiltrate or central endobronchial lesion.  CT ABDOMEN AND PELVIS FINDINGS  No evidence of splenomegaly or splenic masses. The liver, pancreas, adrenal glands, and kidneys are normal in appearance. No evidence hydronephrosis.  Retroperitoneal lymphadenopathy is seen in the left paraaortic and aortocaval space is with index lymph node in the left paraaortic region measuring 2.0 x 2.7 cm on image 70. Shotty less than 1 cm lymph nodes seen in the external iliac chains bilaterally, but no pathologically enlarged nodes seen within the pelvis.  Sigmoid diverticulosis is noted, however there is no evidence of diverticulitis. No other inflammatory process or abnormal fluid collections identified. No suspicious bone lesions identified. Advanced thoracolumbar spine degenerative changes and previous lumbar spine fusion noted.  IMPRESSION: Mild abdominal retroperitoneal lymphadenopathy. Shotty subcentimeter lymph nodes in bilateral external iliac chains.  Small to moderate hiatal hernia.  Diverticulosis. No radiographic evidence of diverticulitis.   Electronically Signed  By: Earle Gell M.D.   On: 05/19/2013 11:22       Assessment & Plan:   Problem List Items Addressed This Visit   Chronic back pain     Following with neurosurgery and physiatry for same Reviewed interval events, status post recent ejection left-sided lower back.  Okay for back brace as needed for support to control stable his pain    Hyperlipidemia - Primary     Changed from crestor to atorva 05/2011 due to cost Low dose atorva due to interaction with HIV meds Check lipids annually - cards to adjust to as needed (follow up next month planned)    Relevant Medications      tadalafil (CIALIS) tablet   Other Relevant Orders      Lipid panel      TSH   Impotence of organic origin     Uses Cialis prn - refill provided Verified no anginal symptoms     Relevant Orders      TSH   Other abnormal glucose      prev metformin for  variable insulin metabolism - high and low cbgs but off since 08/2011 as "not diabetic" per a1c <5 S/p endo eval fall 2013 Check a1c now, reconsider tx as needed Lab Results  Component Value Date   HGBA1C 4.9 06/22/2012       Relevant Orders      Hemoglobin A1c      TSH

## 2013-07-08 NOTE — Progress Notes (Signed)
Pre-visit discussion using our clinic review tool. No additional management support is needed unless otherwise documented below in the visit note.  

## 2013-07-10 ENCOUNTER — Other Ambulatory Visit: Payer: Self-pay | Admitting: Internal Medicine

## 2013-07-11 ENCOUNTER — Other Ambulatory Visit: Payer: Self-pay | Admitting: Family Medicine

## 2013-07-11 LAB — TSH: TSH: 1.25 u[IU]/mL (ref 0.35–5.50)

## 2013-07-11 MED ORDER — WARFARIN SODIUM 10 MG PO TABS: ORAL_TABLET | ORAL | Status: DC

## 2013-07-18 ENCOUNTER — Ambulatory Visit: Payer: Self-pay | Admitting: Internal Medicine

## 2013-07-21 ENCOUNTER — Ambulatory Visit (HOSPITAL_COMMUNITY): Payer: Self-pay | Admitting: Psychiatry

## 2013-07-21 DIAGNOSIS — M25559 Pain in unspecified hip: Secondary | ICD-10-CM | POA: Diagnosis not present

## 2013-07-21 DIAGNOSIS — M069 Rheumatoid arthritis, unspecified: Secondary | ICD-10-CM | POA: Diagnosis not present

## 2013-07-21 DIAGNOSIS — R29898 Other symptoms and signs involving the musculoskeletal system: Secondary | ICD-10-CM | POA: Diagnosis not present

## 2013-07-23 ENCOUNTER — Other Ambulatory Visit: Payer: Self-pay | Admitting: Internal Medicine

## 2013-07-23 DIAGNOSIS — B20 Human immunodeficiency virus [HIV] disease: Secondary | ICD-10-CM

## 2013-07-25 ENCOUNTER — Ambulatory Visit (INDEPENDENT_AMBULATORY_CARE_PROVIDER_SITE_OTHER): Payer: Medicare Other | Admitting: Psychology

## 2013-07-25 DIAGNOSIS — F339 Major depressive disorder, recurrent, unspecified: Secondary | ICD-10-CM

## 2013-07-25 DIAGNOSIS — M76899 Other specified enthesopathies of unspecified lower limb, excluding foot: Secondary | ICD-10-CM | POA: Diagnosis not present

## 2013-07-25 DIAGNOSIS — IMO0001 Reserved for inherently not codable concepts without codable children: Secondary | ICD-10-CM | POA: Diagnosis not present

## 2013-07-25 DIAGNOSIS — M6281 Muscle weakness (generalized): Secondary | ICD-10-CM | POA: Diagnosis not present

## 2013-07-25 DIAGNOSIS — R269 Unspecified abnormalities of gait and mobility: Secondary | ICD-10-CM | POA: Diagnosis not present

## 2013-07-26 ENCOUNTER — Encounter (HOSPITAL_COMMUNITY): Payer: Self-pay | Admitting: Psychology

## 2013-07-26 ENCOUNTER — Encounter: Payer: Self-pay | Admitting: Cardiology

## 2013-07-26 ENCOUNTER — Ambulatory Visit (INDEPENDENT_AMBULATORY_CARE_PROVIDER_SITE_OTHER): Payer: Medicare Other | Admitting: Cardiology

## 2013-07-26 ENCOUNTER — Ambulatory Visit (INDEPENDENT_AMBULATORY_CARE_PROVIDER_SITE_OTHER): Payer: Medicare Other | Admitting: General Practice

## 2013-07-26 VITALS — BP 102/69 | HR 81 | Ht 69.0 in | Wt 189.0 lb

## 2013-07-26 DIAGNOSIS — Z5181 Encounter for therapeutic drug level monitoring: Secondary | ICD-10-CM

## 2013-07-26 DIAGNOSIS — I82409 Acute embolism and thrombosis of unspecified deep veins of unspecified lower extremity: Secondary | ICD-10-CM | POA: Diagnosis not present

## 2013-07-26 DIAGNOSIS — I2510 Atherosclerotic heart disease of native coronary artery without angina pectoris: Secondary | ICD-10-CM

## 2013-07-26 DIAGNOSIS — Z7901 Long term (current) use of anticoagulants: Secondary | ICD-10-CM | POA: Diagnosis not present

## 2013-07-26 DIAGNOSIS — I749 Embolism and thrombosis of unspecified artery: Secondary | ICD-10-CM | POA: Diagnosis not present

## 2013-07-26 LAB — POCT INR: INR: 3

## 2013-07-26 NOTE — Progress Notes (Signed)
Pre visit review using our clinic review tool, if applicable. No additional management support is needed unless otherwise documented below in the visit note. 

## 2013-07-26 NOTE — Progress Notes (Signed)
HPI The patient presents for followup of his known coronary disease. Since I last saw him for the past 2-3 months he has had some increasing palpitations. He will feel his heart fluttering probably multiple times in her he thinks for minutes at a time. It comes and goes spontaneously. It is not every day. It is not reproducible. He has had no presyncope or syncope. He denies any chest pressure, neck or arm discomfort. He is still trying to be active despite his limitations from his orthopedic problems.  Of note he does drink at least a pot of coffee daily.  Allergies  Allergen Reactions  . Other Anaphylaxis and Hives    Pecan  . Morphine     REACTION: severe headache  . Oxycodone-Acetaminophen     REACTION: headache  . Peanut-Containing Drug Products Hives    Swelling of throat  . Penicillins     REACTION: red, flushed  . Promethazine Hcl     REACTION: makes him feel drunk    Current Outpatient Prescriptions  Medication Sig Dispense Refill  . aspirin EC 81 MG tablet Take 81 mg by mouth daily.      [name] atorvastatin (LIPITOR) 10 MG tablet Take 10 mg by mouth at bedtime.      . B Complex-C (B-COMPLEX WITH VITAMIN C) tablet Take 1 tablet by mouth daily.      . Calcium Carb-Cholecalciferol (CALCIUM 1000 + D PO) Take 1 tablet by mouth daily.      . clonazePAM (KLONOPIN) 0.5 MG tablet Take one daily as needed for anxiety  60 tablet  2  . Darunavir Ethanolate (PREZISTA) 800 MG tablet Take 1 tablet (800 mg total) by mouth daily.  30 tablet  5  . EPINEPHrine (EPI-PEN) 0.3 mg/0.3 mL SOAJ Inject 0.3 mg into the muscle once. As needed for allergic reaction      . escitalopram (LEXAPRO) 10 MG tablet Take 1 tablet (10 mg total) by mouth daily.  30 tablet  2  . inFLIXimab (REMICADE) 100 MG injection Inject into the vein.      . ISENTRESS 400 MG tablet TAKE 1 TABLET BY MOUTH TWICE A DAY  60 tablet  5  . lamiVUDine-zidovudine (COMBIVIR) 150-300 MG per tablet Take 1 tablet by mouth 2 (two) times daily.   60 tablet  5  . loratadine (CLARITIN) 10 MG tablet Take 1 tablet (10 mg total) by mouth daily.  30 tablet  11  . metFORMIN (GLUCOPHAGE XR) 500 MG 24 hr tablet Take 1 tablet (500 mg total) by mouth daily with breakfast.  90 tablet  3  . metoprolol succinate (TOPROL-XL) 25 MG 24 hr tablet Take 25 mg by mouth at bedtime.      . mometasone (NASONEX) 50 MCG/ACT nasal spray Place 2 sprays into the nose daily.  17 g  12  . pantoprazole (PROTONIX) 40 MG tablet Take two tablets by mouth daily  60 tablet  4  . polyethylene glycol powder (GLYCOLAX/MIRALAX) powder One scoop daily as needed for constipation.  255 g  3  . pregabalin (LYRICA) 200 MG capsule Take 1 capsule (200 mg total) by mouth 2 (two) times daily.  60 capsule  2  . ritonavir (NORVIR) 100 MG TABS tablet TAKE 1 TABLET BY MOUTH EVERY DAY  30 tablet  5  . saccharomyces boulardii (FLORASTOR) 250 MG capsule Take 1 capsule (250 mg total) by mouth 2 (two) times daily.  60 capsule  3  . tadalafil (CIALIS) 20 MG tablet  Take 1 tablet (20 mg total) by mouth daily as needed for erectile dysfunction.  10 tablet  0  . valACYclovir (VALTREX) 500 MG tablet Take 1 tablet (500 mg total) by mouth daily.  30 tablet  3  . warfarin (COUMADIN) 10 MG tablet Take as directed by anticoagulation clinic  30 tablet  2   No current facility-administered medications for this visit.    Past Medical History  Diagnosis Date  . Osteoarthritis, knee     s/p B TKA  . HIV infection dx 1993  . TIA (transient ischemic attack) 1997    mild residual L mouth droop  . H/O hiatal hernia 2008    surgery  . Gynecomastia, male   . Impotence of organic origin   . Rheumatoid arthritis(714.0) dx 2010    MTX, follows with rheum  . Gout   . Chronic back pain     follows with Nsurg  . Coronary artery disease 2010    s/p CABG '10, sees Dr. Percival Spanish  . Seasonal allergies   . Diverticulosis   . Gallstones   . Status post dilation of esophageal narrowing   . GERD (gastroesophageal  reflux disease)   . Diverticulosis   . Esophagitis   . Hemorrhoids   . Tubular adenoma of colon   . Myocardial infarction 2010     x 2  . Hypertension   . DVT, lower extremity, recurrent 2008, 2009    LLE, chronic anticoag since 2009  . Carotid artery occlusion     40-60% right ICA stenosis (09/2008)  . Neuromuscular disorder     neuropathy  . Fibromyalgia   . Hepatitis A yrs ago  . PONV (postoperative nausea and vomiting)   . HIV (human immunodeficiency virus infection)   . CLL (chronic lymphoblastic leukemia) dx 2010    [facility] at [facility], no current therapy     Past Surgical History  Procedure Laterality Date  . Spine surgery  2010    "rod and screws", "failed", lopwer spine,   . Cholecystectomy    . Hiatal hernia repair      wrap  . Shoulder surgery Left   . Mandible surgery Bilateral     tmj  . Varicose vein      stripping  . Replacement total knee Bilateral   . Knee arthroplasty  07/22/2011    Procedure: COMPUTER ASSISTED TOTAL KNEE ARTHROPLASTY;  Surgeon: Meredith Pel, MD;  Location: [facility];  Service: Orthopedics;  Laterality: Right;  Right total knee arthroplasty  . Coronary artery bypass graft  2010    triple bypass  . Hardware removal N/A 07/02/2012    Procedure: HARDWARE REMOVAL;  Surgeon: Elaina Hoops, MD;  Location: [facility];  Service: Neurosurgery;  Laterality: N/A;  . Inguinal hernia repair Bilateral   . Umbilical hernia repair      x 1  . Rotator cuff repair Right   . Esophagogastroduodenoscopy (egd) with propofol N/A 12/28/2012    Procedure: ESOPHAGOGASTRODUODENOSCOPY (EGD) WITH PROPOFOL;  Surgeon: Jerene Bears, MD;  Location: [facility];  Service: Gastroenterology;  Laterality: N/A;  . Colonoscopy with propofol N/A 12/28/2012    Procedure: COLONOSCOPY WITH PROPOFOL;  Surgeon: Jerene Bears, MD;  Location: [facility];  Service: Gastroenterology;  Laterality: N/A;  . Joint replacement Left 1999  . Tonsillectomy    . Ring around testicle  hernia reapir  184 and 1986    x 2  . Esophagogastroduodenoscopy (egd) with propofol N/A 03/15/2013  Procedure: ESOPHAGOGASTRODUODENOSCOPY (EGD) WITH PROPOFOL;  Surgeon: Jerene Bears, MD;  Location: [facility];  Service: Gastroenterology;  Laterality: N/A;    ROS:  As stated in the HPI and negative for all other systems.  PHYSICAL EXAM BP 102/69  Pulse 81  Ht 5\' 9"  (1.753 m)  Wt 189 lb (85.73 kg)  BMI 27.90 kg/m2  SpO2 97% GENERAL:  Well appearing HEENT:  Pupils equal round and reactive, fundi not visualized, oral mucosa unremarkable NECK:  No jugular venous distention, waveform within normal limits, carotid upstroke brisk and symmetric, no bruits, no thyromegaly LYMPHATICS:  No cervical, inguinal adenopathy LUNGS:  Clear to auscultation bilaterally BACK:  No CVA tenderness, severe lordosis  CHEST:  Well healed sternotomy scar. HEART:  PMI not displaced or sustained,S1 and S2 within normal limits, no S3, no S4, no clicks, no rubs, no murmurs ABD:  Flat, positive bowel sounds normal in frequency in pitch, no bruits, no rebound, no guarding, no midline pulsatile mass, no hepatomegaly, no splenomegaly EXT:  2 plus pulses throughout, no edema, no cyanosis no clubbing     ASSESSMENT AND PLAN  PALPITATIONS:    He will start by eliminating caffeine. He continues to have symptoms however I will place a monitor to further evaluate.  CAD The patient has no new sypmtoms.  No further cardiovascular testing is indicated.  We will continue with aggressive risk reduction and meds as listed.  CAROTID STENOSIS He has had minimal carotid plaque.  No follow up is needed at this time.   HYPERLIPIDEMIA I reviewed his lipid profile with an HDL of 39.1 and an LDL of 68. This is an appropriate level. No change in therapy is indicated.  HTN His blood pressure is well controlled. He will continue on the meds as listed.  DVT The patient will continue with warfarin.

## 2013-07-26 NOTE — Patient Instructions (Signed)
Your physician recommends that you schedule a follow-up appointment in: 1 year. You will receive a reminder letter in the mail in about 10 months reminding you to call and schedule your appointment. If you don't receive this letter, please contact our office. Your physician recommends that you continue on your current medications as directed. Please refer to the Current Medication list given to you today. Refrain from any caffeine use.

## 2013-07-26 NOTE — Progress Notes (Signed)
Patient:  [name]   DOB: 02/08/1951  MR Number: [id number]  Location: [facility] [place] [address] Dept: [phone]  Start: 10 AM End: 11 AM  Provider/Observer:     Edgardo Roys PSYD  Chief Complaint:      Chief Complaint  Patient presents with  . Depression    Reason For Service:     The patient was referred by Dr. Harrington Challenger for psychotherapeutic interventions. The patient has had a lot of difficulties recently with severe depression and stress. 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 The patient reports that financial concerns and difficulty with tennis exacerbated his long-standing issues of depression. The patient is gay and is positive for HIV. The patient has been managing this to some degree. The patient reports that he hit the fact that he was gay for many years and was married on 3 separate occasions. He moved to the area for business opportunity that he is struggled at. 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 The patient reports that he is at times with significant depression and frustration.  Interventions Strategy:  Cognitive/behavioral psychotherapeutic interventions  Participation Level:   Active  Participation Quality:  Appropriate      Behavioral Observation:  Well Groomed, Alert, and Appropriate.   Current Psychosocial Factors: The patient reports that he is making good progress towards getting some of the stressors ready for. The patient reports that this is been part of his therapeutic efforts his overall symptoms of depression and feelings of helplessness and hopelessness.  Content of Session:   Review current symptoms and continue to work on therapeutic interventions for issues of depression and stress.  Current Status:   The  patient reports that his depression has continued to improve and that he is doing much better as far as actively working on his business and working on his overall health functioning. The patient has looked into starting physical rehabilitation is back again..  Patient Progress:   Stable  Target Goals:   Target goals include reducing the intensity, severity, and duration of symptoms of depression.  Last Reviewed:   07/26/2013  Goals Addressed Today:    Today we worked on issues related to depression and coping skills.  Impression/Diagnosis:   The patient is a long history of depression but has been dealing with more significant depression recently. The patient reports that financial stressors and dealing with his business up in the primary culprit.  Diagnosis:    Axis I: Major depressive disorder, recurrent

## 2013-07-27 DIAGNOSIS — IMO0001 Reserved for inherently not codable concepts without codable children: Secondary | ICD-10-CM | POA: Diagnosis not present

## 2013-07-27 DIAGNOSIS — M6281 Muscle weakness (generalized): Secondary | ICD-10-CM | POA: Diagnosis not present

## 2013-07-27 DIAGNOSIS — R269 Unspecified abnormalities of gait and mobility: Secondary | ICD-10-CM | POA: Diagnosis not present

## 2013-07-27 DIAGNOSIS — M76899 Other specified enthesopathies of unspecified lower limb, excluding foot: Secondary | ICD-10-CM | POA: Diagnosis not present

## 2013-07-28 ENCOUNTER — Ambulatory Visit (INDEPENDENT_AMBULATORY_CARE_PROVIDER_SITE_OTHER): Payer: Medicare Other | Admitting: Psychiatry

## 2013-07-28 ENCOUNTER — Encounter (HOSPITAL_COMMUNITY): Payer: Self-pay | Admitting: Psychiatry

## 2013-07-28 VITALS — BP 110/68 | Ht 69.0 in | Wt 191.0 lb

## 2013-07-28 DIAGNOSIS — F332 Major depressive disorder, recurrent severe without psychotic features: Secondary | ICD-10-CM | POA: Diagnosis not present

## 2013-07-28 DIAGNOSIS — F339 Major depressive disorder, recurrent, unspecified: Secondary | ICD-10-CM

## 2013-07-28 DIAGNOSIS — I2510 Atherosclerotic heart disease of native coronary artery without angina pectoris: Secondary | ICD-10-CM | POA: Diagnosis not present

## 2013-07-28 MED ORDER — ESCITALOPRAM OXALATE 10 MG PO TABS: 10.0000 mg | ORAL_TABLET | Freq: Every day | ORAL | Status: DC
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
## 2013-07-29 DIAGNOSIS — M6281 Muscle weakness (generalized): Secondary | ICD-10-CM | POA: Diagnosis not present

## 2013-07-29 DIAGNOSIS — IMO0001 Reserved for inherently not codable concepts without codable children: Secondary | ICD-10-CM | POA: Diagnosis not present

## 2013-07-29 DIAGNOSIS — M76899 Other specified enthesopathies of unspecified lower limb, excluding foot: Secondary | ICD-10-CM | POA: Diagnosis not present

## 2013-07-29 DIAGNOSIS — R269 Unspecified abnormalities of gait and mobility: Secondary | ICD-10-CM | POA: Diagnosis not present

## 2013-08-01 DIAGNOSIS — R269 Unspecified abnormalities of gait and mobility: Secondary | ICD-10-CM | POA: Diagnosis not present

## 2013-08-01 DIAGNOSIS — M76899 Other specified enthesopathies of unspecified lower limb, excluding foot: Secondary | ICD-10-CM | POA: Diagnosis not present

## 2013-08-01 DIAGNOSIS — IMO0001 Reserved for inherently not codable concepts without codable children: Secondary | ICD-10-CM | POA: Diagnosis not present

## 2013-08-01 DIAGNOSIS — M6281 Muscle weakness (generalized): Secondary | ICD-10-CM | POA: Diagnosis not present

## 2013-08-03 DIAGNOSIS — M76899 Other specified enthesopathies of unspecified lower limb, excluding foot: Secondary | ICD-10-CM | POA: Diagnosis not present

## 2013-08-03 DIAGNOSIS — IMO0001 Reserved for inherently not codable concepts without codable children: Secondary | ICD-10-CM | POA: Diagnosis not present

## 2013-08-03 DIAGNOSIS — R269 Unspecified abnormalities of gait and mobility: Secondary | ICD-10-CM | POA: Diagnosis not present

## 2013-08-03 DIAGNOSIS — M6281 Muscle weakness (generalized): Secondary | ICD-10-CM | POA: Diagnosis not present

## 2013-08-04 ENCOUNTER — Encounter (HOSPITAL_COMMUNITY): Payer: Self-pay | Admitting: Psychology

## 2013-08-04 ENCOUNTER — Ambulatory Visit (INDEPENDENT_AMBULATORY_CARE_PROVIDER_SITE_OTHER): Payer: Medicare Other | Admitting: Psychology

## 2013-08-04 DIAGNOSIS — F339 Major depressive disorder, recurrent, unspecified: Secondary | ICD-10-CM

## 2013-08-04 NOTE — Progress Notes (Signed)
   PROGRESS NOTE  Patient:  [name]   DOB: [birth year]  MR Number: [id number]  Location: [facility] [address] [place] [address] Dept: [phone]  Start: 9 AM End: 10 AM  Provider/Observer:     Edgardo Roys PSYD  Chief Complaint:      Chief Complaint  Patient presents with  . Depression    Reason For Service:     The patient was referred by Dr. Harrington Challenger for psychotherapeutic interventions. The patient has had a lot of difficulties recently with severe depression and stress. 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 The patient reports that financial concerns and difficulty with tennis exacerbated his long-standing issues of depression. The patient is gay and is positive for HIV. The patient has been managing this to some degree. The patient reports that he hit the fact that he was gay for many years and was married on 3 separate occasions. He moved to the area for business opportunity that he is struggled at. 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 The patient reports that he is at times with significant depression and frustration.  Interventions Strategy:  Cognitive/behavioral psychotherapeutic interventions  Participation Level:   Active  Participation Quality:  Appropriate      Behavioral Observation:  Well Groomed, Alert, and Appropriate.   Current Psychosocial Factors: The patient reports that he may have an offer for his business and he is considering taking it if offer is high enough.  The patient reports that this is common practice for him in the past.  His daugher is partially on board with this action.  Content of Session:   Review current symptoms and continue to work on therapeutic interventions for issues of depression and stress.  Current  Status:   The patient reports that his depression has contineud to improve and that he has been getting more done around his park and feeling better with less depressive symptoms.  Patient Progress:   Stable  Target Goals:   Target goals include reducing the intensity, severity, and duration of symptoms of depression.  Last Reviewed:   08/04/2013  Goals Addressed Today:    Today we worked on issues related to depression and coping skills.  Impression/Diagnosis:   The patient is a long history of depression but has been dealing with more significant depression recently. The patient reports that financial stressors and dealing with his business up in the primary culprit.  Diagnosis:    Axis I: Major depressive disorder, recurrent   Jaison Petraglia R, PsyD 08/04/2013

## 2013-08-05 DIAGNOSIS — R269 Unspecified abnormalities of gait and mobility: Secondary | ICD-10-CM | POA: Diagnosis not present

## 2013-08-05 DIAGNOSIS — M6281 Muscle weakness (generalized): Secondary | ICD-10-CM | POA: Diagnosis not present

## 2013-08-05 DIAGNOSIS — IMO0001 Reserved for inherently not codable concepts without codable children: Secondary | ICD-10-CM | POA: Diagnosis not present

## 2013-08-05 DIAGNOSIS — M76899 Other specified enthesopathies of unspecified lower limb, excluding foot: Secondary | ICD-10-CM | POA: Diagnosis not present

## 2013-08-08 DIAGNOSIS — IMO0001 Reserved for inherently not codable concepts without codable children: Secondary | ICD-10-CM | POA: Diagnosis not present

## 2013-08-08 DIAGNOSIS — M6281 Muscle weakness (generalized): Secondary | ICD-10-CM | POA: Diagnosis not present

## 2013-08-08 DIAGNOSIS — M76899 Other specified enthesopathies of unspecified lower limb, excluding foot: Secondary | ICD-10-CM | POA: Diagnosis not present

## 2013-08-08 DIAGNOSIS — R269 Unspecified abnormalities of gait and mobility: Secondary | ICD-10-CM | POA: Diagnosis not present

## 2013-08-10 DIAGNOSIS — M76899 Other specified enthesopathies of unspecified lower limb, excluding foot: Secondary | ICD-10-CM | POA: Diagnosis not present

## 2013-08-10 DIAGNOSIS — M6281 Muscle weakness (generalized): Secondary | ICD-10-CM | POA: Diagnosis not present

## 2013-08-10 DIAGNOSIS — IMO0001 Reserved for inherently not codable concepts without codable children: Secondary | ICD-10-CM | POA: Diagnosis not present

## 2013-08-10 DIAGNOSIS — R269 Unspecified abnormalities of gait and mobility: Secondary | ICD-10-CM | POA: Diagnosis not present

## 2013-08-12 LAB — POCT INR: INR: 2.4

## 2013-08-15 DIAGNOSIS — R269 Unspecified abnormalities of gait and mobility: Secondary | ICD-10-CM | POA: Diagnosis not present

## 2013-08-15 DIAGNOSIS — M76899 Other specified enthesopathies of unspecified lower limb, excluding foot: Secondary | ICD-10-CM | POA: Diagnosis not present

## 2013-08-15 DIAGNOSIS — IMO0001 Reserved for inherently not codable concepts without codable children: Secondary | ICD-10-CM | POA: Diagnosis not present

## 2013-08-15 DIAGNOSIS — M6281 Muscle weakness (generalized): Secondary | ICD-10-CM | POA: Diagnosis not present

## 2013-08-16 ENCOUNTER — Ambulatory Visit (INDEPENDENT_AMBULATORY_CARE_PROVIDER_SITE_OTHER): Payer: Medicare Other | Admitting: General Practice

## 2013-08-16 DIAGNOSIS — Z5181 Encounter for therapeutic drug level monitoring: Secondary | ICD-10-CM

## 2013-08-16 DIAGNOSIS — I82409 Acute embolism and thrombosis of unspecified deep veins of unspecified lower extremity: Secondary | ICD-10-CM

## 2013-08-16 NOTE — Progress Notes (Signed)
Pre visit review using our clinic review tool, if applicable. No additional management support is needed unless otherwise documented below in the visit note. 

## 2013-08-17 DIAGNOSIS — M76899 Other specified enthesopathies of unspecified lower limb, excluding foot: Secondary | ICD-10-CM | POA: Diagnosis not present

## 2013-08-17 DIAGNOSIS — IMO0001 Reserved for inherently not codable concepts without codable children: Secondary | ICD-10-CM | POA: Diagnosis not present

## 2013-08-17 DIAGNOSIS — R269 Unspecified abnormalities of gait and mobility: Secondary | ICD-10-CM | POA: Diagnosis not present

## 2013-08-17 DIAGNOSIS — M6281 Muscle weakness (generalized): Secondary | ICD-10-CM | POA: Diagnosis not present

## 2013-08-18 DIAGNOSIS — L821 Other seborrheic keratosis: Secondary | ICD-10-CM | POA: Diagnosis not present

## 2013-08-18 DIAGNOSIS — M459 Ankylosing spondylitis of unspecified sites in spine: Secondary | ICD-10-CM | POA: Diagnosis not present

## 2013-08-18 DIAGNOSIS — M069 Rheumatoid arthritis, unspecified: Secondary | ICD-10-CM | POA: Diagnosis not present

## 2013-08-18 DIAGNOSIS — R7989 Other specified abnormal findings of blood chemistry: Secondary | ICD-10-CM | POA: Diagnosis not present

## 2013-08-18 DIAGNOSIS — L570 Actinic keratosis: Secondary | ICD-10-CM | POA: Diagnosis not present

## 2013-08-19 DIAGNOSIS — M6281 Muscle weakness (generalized): Secondary | ICD-10-CM | POA: Diagnosis not present

## 2013-08-19 DIAGNOSIS — M76899 Other specified enthesopathies of unspecified lower limb, excluding foot: Secondary | ICD-10-CM | POA: Diagnosis not present

## 2013-08-19 DIAGNOSIS — IMO0001 Reserved for inherently not codable concepts without codable children: Secondary | ICD-10-CM | POA: Diagnosis not present

## 2013-08-19 DIAGNOSIS — R269 Unspecified abnormalities of gait and mobility: Secondary | ICD-10-CM | POA: Diagnosis not present

## 2013-08-21 ENCOUNTER — Other Ambulatory Visit (HOSPITAL_COMMUNITY): Payer: Self-pay | Admitting: Psychiatry

## 2013-08-22 DIAGNOSIS — R269 Unspecified abnormalities of gait and mobility: Secondary | ICD-10-CM | POA: Diagnosis not present

## 2013-08-22 DIAGNOSIS — M6281 Muscle weakness (generalized): Secondary | ICD-10-CM | POA: Diagnosis not present

## 2013-08-22 DIAGNOSIS — M76899 Other specified enthesopathies of unspecified lower limb, excluding foot: Secondary | ICD-10-CM | POA: Diagnosis not present

## 2013-08-22 DIAGNOSIS — IMO0001 Reserved for inherently not codable concepts without codable children: Secondary | ICD-10-CM | POA: Diagnosis not present

## 2013-08-24 ENCOUNTER — Other Ambulatory Visit (HOSPITAL_COMMUNITY): Payer: Self-pay | Admitting: Psychiatry

## 2013-08-24 ENCOUNTER — Telehealth (HOSPITAL_COMMUNITY): Payer: Self-pay | Admitting: *Deleted

## 2013-08-24 DIAGNOSIS — M76899 Other specified enthesopathies of unspecified lower limb, excluding foot: Secondary | ICD-10-CM | POA: Diagnosis not present

## 2013-08-24 DIAGNOSIS — IMO0001 Reserved for inherently not codable concepts without codable children: Secondary | ICD-10-CM | POA: Diagnosis not present

## 2013-08-24 DIAGNOSIS — R269 Unspecified abnormalities of gait and mobility: Secondary | ICD-10-CM | POA: Diagnosis not present

## 2013-08-24 DIAGNOSIS — M6281 Muscle weakness (generalized): Secondary | ICD-10-CM | POA: Diagnosis not present

## 2013-08-24 MED ORDER — ESCITALOPRAM OXALATE 10 MG PO TABS: 10.0000 mg | ORAL_TABLET | Freq: Every day | ORAL | Status: DC

## 2013-08-25 DIAGNOSIS — L851 Acquired keratosis [keratoderma] palmaris et plantaris: Secondary | ICD-10-CM | POA: Diagnosis not present

## 2013-08-25 DIAGNOSIS — E1149 Type 2 diabetes mellitus with other diabetic neurological complication: Secondary | ICD-10-CM | POA: Diagnosis not present

## 2013-08-25 DIAGNOSIS — B351 Tinea unguium: Secondary | ICD-10-CM | POA: Diagnosis not present

## 2013-08-26 DIAGNOSIS — M76899 Other specified enthesopathies of unspecified lower limb, excluding foot: Secondary | ICD-10-CM | POA: Diagnosis not present

## 2013-08-26 DIAGNOSIS — R269 Unspecified abnormalities of gait and mobility: Secondary | ICD-10-CM | POA: Diagnosis not present

## 2013-08-26 DIAGNOSIS — IMO0001 Reserved for inherently not codable concepts without codable children: Secondary | ICD-10-CM | POA: Diagnosis not present

## 2013-08-26 DIAGNOSIS — M6281 Muscle weakness (generalized): Secondary | ICD-10-CM | POA: Diagnosis not present

## 2013-08-29 DIAGNOSIS — IMO0001 Reserved for inherently not codable concepts without codable children: Secondary | ICD-10-CM | POA: Diagnosis not present

## 2013-08-29 DIAGNOSIS — R269 Unspecified abnormalities of gait and mobility: Secondary | ICD-10-CM | POA: Diagnosis not present

## 2013-08-29 DIAGNOSIS — M76899 Other specified enthesopathies of unspecified lower limb, excluding foot: Secondary | ICD-10-CM | POA: Diagnosis not present

## 2013-08-29 DIAGNOSIS — M6281 Muscle weakness (generalized): Secondary | ICD-10-CM | POA: Diagnosis not present

## 2013-08-30 ENCOUNTER — Telehealth (HOSPITAL_COMMUNITY): Payer: Self-pay | Admitting: *Deleted

## 2013-08-31 ENCOUNTER — Ambulatory Visit (INDEPENDENT_AMBULATORY_CARE_PROVIDER_SITE_OTHER): Payer: Medicare Other | Admitting: General Practice

## 2013-08-31 DIAGNOSIS — Z5181 Encounter for therapeutic drug level monitoring: Secondary | ICD-10-CM

## 2013-08-31 DIAGNOSIS — M76899 Other specified enthesopathies of unspecified lower limb, excluding foot: Secondary | ICD-10-CM | POA: Diagnosis not present

## 2013-08-31 DIAGNOSIS — I82409 Acute embolism and thrombosis of unspecified deep veins of unspecified lower extremity: Secondary | ICD-10-CM

## 2013-08-31 DIAGNOSIS — M6281 Muscle weakness (generalized): Secondary | ICD-10-CM | POA: Diagnosis not present

## 2013-08-31 DIAGNOSIS — R269 Unspecified abnormalities of gait and mobility: Secondary | ICD-10-CM | POA: Diagnosis not present

## 2013-08-31 DIAGNOSIS — IMO0001 Reserved for inherently not codable concepts without codable children: Secondary | ICD-10-CM | POA: Diagnosis not present

## 2013-08-31 LAB — POCT INR: INR: 4.7

## 2013-08-31 NOTE — Progress Notes (Signed)
Pre visit review using our clinic review tool, if applicable. No additional management support is needed unless otherwise documented below in the visit note. 

## 2013-09-01 DIAGNOSIS — M069 Rheumatoid arthritis, unspecified: Secondary | ICD-10-CM | POA: Diagnosis not present

## 2013-09-02 ENCOUNTER — Other Ambulatory Visit: Payer: Self-pay | Admitting: *Deleted

## 2013-09-02 DIAGNOSIS — IMO0001 Reserved for inherently not codable concepts without codable children: Secondary | ICD-10-CM | POA: Diagnosis not present

## 2013-09-02 DIAGNOSIS — M76899 Other specified enthesopathies of unspecified lower limb, excluding foot: Secondary | ICD-10-CM | POA: Diagnosis not present

## 2013-09-02 DIAGNOSIS — M6281 Muscle weakness (generalized): Secondary | ICD-10-CM | POA: Diagnosis not present

## 2013-09-02 DIAGNOSIS — R269 Unspecified abnormalities of gait and mobility: Secondary | ICD-10-CM | POA: Diagnosis not present

## 2013-09-02 NOTE — Telephone Encounter (Signed)
Prior authorization started.  Called [phone], pt's Medicare Part D plan.  Case # [id number].  Pt called stating he was out of Lyrica.  Phone call to [facility] to let them know that an expedited review was requested.  Should hear in 24 hours, 4 pm 09/03/13..  Message left for pt with this same information.  Requested pt call [facility] to try to fill rx.

## 2013-09-05 ENCOUNTER — Ambulatory Visit (HOSPITAL_COMMUNITY): Payer: Self-pay | Admitting: Psychology

## 2013-09-06 ENCOUNTER — Telehealth: Payer: Self-pay | Admitting: *Deleted

## 2013-09-06 NOTE — Telephone Encounter (Signed)
Lyrica approved 06/04/13 - 09/02/14, up to 120 capsules every 30 days.  Per Lorne Skeens, Case [id number].

## 2013-09-07 DIAGNOSIS — IMO0001 Reserved for inherently not codable concepts without codable children: Secondary | ICD-10-CM | POA: Diagnosis not present

## 2013-09-07 DIAGNOSIS — M76899 Other specified enthesopathies of unspecified lower limb, excluding foot: Secondary | ICD-10-CM | POA: Diagnosis not present

## 2013-09-07 DIAGNOSIS — M6281 Muscle weakness (generalized): Secondary | ICD-10-CM | POA: Diagnosis not present

## 2013-09-07 DIAGNOSIS — R269 Unspecified abnormalities of gait and mobility: Secondary | ICD-10-CM | POA: Diagnosis not present

## 2013-09-08 ENCOUNTER — Ambulatory Visit (INDEPENDENT_AMBULATORY_CARE_PROVIDER_SITE_OTHER): Payer: Medicare Other | Admitting: Internal Medicine

## 2013-09-08 ENCOUNTER — Encounter: Payer: Self-pay | Admitting: Internal Medicine

## 2013-09-08 VITALS — BP 137/73 | HR 72 | Temp 98.6°F | Wt 187.0 lb

## 2013-09-08 DIAGNOSIS — I2510 Atherosclerotic heart disease of native coronary artery without angina pectoris: Secondary | ICD-10-CM

## 2013-09-08 DIAGNOSIS — Z113 Encounter for screening for infections with a predominantly sexual mode of transmission: Secondary | ICD-10-CM

## 2013-09-08 DIAGNOSIS — G609 Hereditary and idiopathic neuropathy, unspecified: Secondary | ICD-10-CM | POA: Insufficient documentation

## 2013-09-08 DIAGNOSIS — Z23 Encounter for immunization: Secondary | ICD-10-CM | POA: Diagnosis not present

## 2013-09-08 DIAGNOSIS — B20 Human immunodeficiency virus [HIV] disease: Secondary | ICD-10-CM

## 2013-09-08 MED ORDER — GABAPENTIN 300 MG PO CAPS: 300.0000 mg | ORAL_CAPSULE | Freq: Three times a day (TID) | ORAL | Status: DC

## 2013-09-08 NOTE — Addendum Note (Signed)
Addended by: Myrtis Hopping A on: 09/08/2013 11:29 AM   Modules accepted: Orders

## 2013-09-08 NOTE — Assessment & Plan Note (Signed)
lyrica does not seem to be as effective.  Will try gabapentin.  Titrating dose. RTC in 2 months and see if increase needed.

## 2013-09-08 NOTE — Progress Notes (Signed)
  Subjective:    Patient ID: [name], male    DOB: [birth year], 63 y.o.   MRN: [id number]  HPI  He comes in for routine follow up.  He continues on Prezista with norvir, Combivir and Isentress.  He denies any missed doses.  He feels well on remicaide for RA.   He though continues to do well with his change of regimen with labs showing undetectable viral load and good CD4 count. No missed doses.  Having more problems with peripheral neuropathy.  Would like to try gabapentin.  Has not had hep B series in the past.      Review of Systems  Constitutional: Negative for fever and fatigue.  HENT: Negative for sore throat and trouble swallowing.   Eyes: Negative for visual disturbance.  Respiratory: Negative for shortness of breath.   Gastrointestinal: Negative for nausea, abdominal pain, diarrhea and abdominal distention.  Musculoskeletal: Positive for arthralgias.       Much improved on remicaide  Skin: Negative for rash.  Neurological: Negative for dizziness and headaches.  Hematological: Negative for adenopathy.  Psychiatric/Behavioral: Negative for dysphoric mood.       Objective:   Physical Exam  Constitutional: He is oriented to person, place, and time. He appears well-developed.  HENT:  Mouth/Throat: No oropharyngeal exudate.  Eyes: Right eye exhibits no discharge. Left eye exhibits no discharge. No scleral icterus.  Cardiovascular: Normal rate, regular rhythm and normal heart sounds.   No murmur heard. Pulmonary/Chest: Effort normal and breath sounds normal. No respiratory distress. He has no wheezes.  Musculoskeletal:  Spinal deformity Changes of RA in hands  Lymphadenopathy:    He has no cervical adenopathy.  Neurological: He is alert and oriented to person, place, and time.  Skin: Skin is warm and dry. No rash noted.  Psychiatric: He has a normal mood and affect. His behavior is normal.          Assessment & Plan:

## 2013-09-08 NOTE — Assessment & Plan Note (Signed)
Doing great.  Will return in about 2 months with labs.

## 2013-09-09 DIAGNOSIS — M25559 Pain in unspecified hip: Secondary | ICD-10-CM | POA: Diagnosis not present

## 2013-09-09 DIAGNOSIS — IMO0001 Reserved for inherently not codable concepts without codable children: Secondary | ICD-10-CM | POA: Diagnosis not present

## 2013-09-12 DIAGNOSIS — M25559 Pain in unspecified hip: Secondary | ICD-10-CM | POA: Diagnosis not present

## 2013-09-12 DIAGNOSIS — IMO0001 Reserved for inherently not codable concepts without codable children: Secondary | ICD-10-CM | POA: Diagnosis not present

## 2013-09-14 DIAGNOSIS — M25559 Pain in unspecified hip: Secondary | ICD-10-CM | POA: Diagnosis not present

## 2013-09-14 DIAGNOSIS — IMO0001 Reserved for inherently not codable concepts without codable children: Secondary | ICD-10-CM | POA: Diagnosis not present

## 2013-09-16 DIAGNOSIS — M25559 Pain in unspecified hip: Secondary | ICD-10-CM | POA: Diagnosis not present

## 2013-09-16 DIAGNOSIS — IMO0001 Reserved for inherently not codable concepts without codable children: Secondary | ICD-10-CM | POA: Diagnosis not present

## 2013-09-19 DIAGNOSIS — M25559 Pain in unspecified hip: Secondary | ICD-10-CM | POA: Diagnosis not present

## 2013-09-19 DIAGNOSIS — IMO0001 Reserved for inherently not codable concepts without codable children: Secondary | ICD-10-CM | POA: Diagnosis not present

## 2013-09-20 ENCOUNTER — Ambulatory Visit (INDEPENDENT_AMBULATORY_CARE_PROVIDER_SITE_OTHER): Payer: Medicare Other | Admitting: Family Medicine

## 2013-09-20 DIAGNOSIS — Z5181 Encounter for therapeutic drug level monitoring: Secondary | ICD-10-CM

## 2013-09-20 DIAGNOSIS — I82409 Acute embolism and thrombosis of unspecified deep veins of unspecified lower extremity: Secondary | ICD-10-CM

## 2013-09-20 LAB — POCT INR: INR: 3.1

## 2013-09-21 DIAGNOSIS — M25559 Pain in unspecified hip: Secondary | ICD-10-CM | POA: Diagnosis not present

## 2013-09-21 DIAGNOSIS — IMO0001 Reserved for inherently not codable concepts without codable children: Secondary | ICD-10-CM | POA: Diagnosis not present

## 2013-09-22 DIAGNOSIS — Z21 Asymptomatic human immunodeficiency virus [HIV] infection status: Secondary | ICD-10-CM | POA: Diagnosis not present

## 2013-09-22 DIAGNOSIS — M25559 Pain in unspecified hip: Secondary | ICD-10-CM | POA: Diagnosis not present

## 2013-09-22 DIAGNOSIS — S79929A Unspecified injury of unspecified thigh, initial encounter: Secondary | ICD-10-CM | POA: Diagnosis not present

## 2013-09-22 DIAGNOSIS — F329 Major depressive disorder, single episode, unspecified: Secondary | ICD-10-CM | POA: Diagnosis not present

## 2013-09-22 DIAGNOSIS — Z7982 Long term (current) use of aspirin: Secondary | ICD-10-CM | POA: Diagnosis not present

## 2013-09-22 DIAGNOSIS — IMO0002 Reserved for concepts with insufficient information to code with codable children: Secondary | ICD-10-CM | POA: Diagnosis not present

## 2013-09-22 DIAGNOSIS — Z9861 Coronary angioplasty status: Secondary | ICD-10-CM | POA: Diagnosis not present

## 2013-09-22 DIAGNOSIS — Z79899 Other long term (current) drug therapy: Secondary | ICD-10-CM | POA: Diagnosis not present

## 2013-09-22 DIAGNOSIS — F3289 Other specified depressive episodes: Secondary | ICD-10-CM | POA: Diagnosis not present

## 2013-09-22 DIAGNOSIS — M79609 Pain in unspecified limb: Secondary | ICD-10-CM | POA: Diagnosis not present

## 2013-09-22 DIAGNOSIS — Z7901 Long term (current) use of anticoagulants: Secondary | ICD-10-CM | POA: Diagnosis not present

## 2013-09-22 DIAGNOSIS — W19XXXA Unspecified fall, initial encounter: Secondary | ICD-10-CM | POA: Diagnosis not present

## 2013-09-22 DIAGNOSIS — S79919A Unspecified injury of unspecified hip, initial encounter: Secondary | ICD-10-CM | POA: Diagnosis not present

## 2013-09-23 DIAGNOSIS — M76899 Other specified enthesopathies of unspecified lower limb, excluding foot: Secondary | ICD-10-CM | POA: Diagnosis not present

## 2013-10-04 ENCOUNTER — Ambulatory Visit (INDEPENDENT_AMBULATORY_CARE_PROVIDER_SITE_OTHER): Payer: Medicare Other | Admitting: Internal Medicine

## 2013-10-04 ENCOUNTER — Other Ambulatory Visit (INDEPENDENT_AMBULATORY_CARE_PROVIDER_SITE_OTHER): Payer: Medicare Other

## 2013-10-04 ENCOUNTER — Encounter: Payer: Self-pay | Admitting: Internal Medicine

## 2013-10-04 VITALS — BP 122/72 | HR 65 | Temp 97.8°F | Resp 14 | Wt 182.8 lb

## 2013-10-04 DIAGNOSIS — R0609 Other forms of dyspnea: Secondary | ICD-10-CM

## 2013-10-04 DIAGNOSIS — R002 Palpitations: Secondary | ICD-10-CM

## 2013-10-04 DIAGNOSIS — W57XXXA Bitten or stung by nonvenomous insect and other nonvenomous arthropods, initial encounter: Secondary | ICD-10-CM | POA: Diagnosis not present

## 2013-10-04 DIAGNOSIS — R0600 Dyspnea, unspecified: Secondary | ICD-10-CM

## 2013-10-04 DIAGNOSIS — R0989 Other specified symptoms and signs involving the circulatory and respiratory systems: Secondary | ICD-10-CM

## 2013-10-04 DIAGNOSIS — Z5181 Encounter for therapeutic drug level monitoring: Secondary | ICD-10-CM | POA: Diagnosis not present

## 2013-10-04 DIAGNOSIS — Z7901 Long term (current) use of anticoagulants: Secondary | ICD-10-CM

## 2013-10-04 DIAGNOSIS — M25559 Pain in unspecified hip: Secondary | ICD-10-CM | POA: Diagnosis not present

## 2013-10-04 DIAGNOSIS — I2510 Atherosclerotic heart disease of native coronary artery without angina pectoris: Secondary | ICD-10-CM

## 2013-10-04 DIAGNOSIS — S90569A Insect bite (nonvenomous), unspecified ankle, initial encounter: Secondary | ICD-10-CM | POA: Diagnosis not present

## 2013-10-04 DIAGNOSIS — S80269A Insect bite (nonvenomous), unspecified knee, initial encounter: Secondary | ICD-10-CM

## 2013-10-04 DIAGNOSIS — IMO0001 Reserved for inherently not codable concepts without codable children: Secondary | ICD-10-CM | POA: Diagnosis not present

## 2013-10-04 DIAGNOSIS — I82409 Acute embolism and thrombosis of unspecified deep veins of unspecified lower extremity: Secondary | ICD-10-CM

## 2013-10-04 LAB — CBC WITH DIFFERENTIAL/PLATELET
Basophils Absolute: 0 10*3/uL (ref 0.0–0.1)
Basophils Relative: 0.4 % (ref 0.0–3.0)
Eosinophils Absolute: 0.1 10*3/uL (ref 0.0–0.7)
Eosinophils Relative: 1.4 % (ref 0.0–5.0)
HCT: 41.3 % (ref 39.0–52.0)
Hemoglobin: 13.6 g/dL (ref 13.0–17.0)
Lymphocytes Relative: 57.2 % — ABNORMAL HIGH (ref 12.0–46.0)
Lymphs Abs: 4.7 10*3/uL — ABNORMAL HIGH (ref 0.7–4.0)
MCHC: 33.1 g/dL (ref 30.0–36.0)
MCV: 121.9 fl — ABNORMAL HIGH (ref 78.0–100.0)
Monocytes Absolute: 0.9 10*3/uL (ref 0.1–1.0)
Monocytes Relative: 11 % (ref 3.0–12.0)
Neutro Abs: 2.5 10*3/uL (ref 1.4–7.7)
Neutrophils Relative %: 30 % — ABNORMAL LOW (ref 43.0–77.0)
Platelets: 181 10*3/uL (ref 150.0–400.0)
RBC: 3.38 Mil/uL — ABNORMAL LOW (ref 4.22–5.81)
RDW: 15 % (ref 11.5–15.5)
WBC: 8.3 10*3/uL (ref 4.0–10.5)

## 2013-10-04 LAB — PROTIME-INR
INR: 2.4 ratio — ABNORMAL HIGH (ref 0.8–1.0)
Prothrombin Time: 25.5 s — ABNORMAL HIGH (ref 9.6–13.1)

## 2013-10-04 MED ORDER — DOXYCYCLINE HYCLATE 100 MG PO TABS: 100.0000 mg | ORAL_TABLET | Freq: Two times a day (BID) | ORAL | Status: DC

## 2013-10-04 NOTE — Patient Instructions (Signed)
Your next office appointment will be determined based upon review of your pending labs & x-rays. Those instructions will be transmitted to you through My Chart  Followup as needed for your acute issue. Please report any significant change in your symptoms. To prevent palpitations or premature beats, avoid stimulants such as decongestants, diet pills, nicotine, or caffeine (coffee, tea, cola, or chocolate) to excess.

## 2013-10-04 NOTE — Progress Notes (Signed)
Subjective:    Patient ID: [name], male    DOB: 1950-11-23, 63 y.o.   MRN: [id number]  HPI   He describes fatigue for over a year. This has been evaluated most recently by his primary care physician in February this year. At that time there was a minimal elevation of one liver enzymes; the remainder of the studies were normal.  Dr. Linus Salmons has extensive future labs pending; orders entered 09/08/13.  He now has exertional dyspnea after climbing up 8 steps. Also for 4-5 days he has been having palpitations and fluttering.  He was bitten by a tick 09/29/13 in the left popliteal space.. He had some headache, sore mouth, chills. There was no associated fever. [name]  He did fall 5/14 sustaining ecchymosis to the left hip /thigh extensively.  Review of Systems He has had no change in his weight; he has no significant diaphoresis  There's been no visual changes of diplopia, blurred vision, or vision loss with his symptoms  He had had some difficulty swallowing but this is not an active issue. There is no associated hoarseness  The shortness of breath is not associated with cough or sputum production  He has no edema or paroxysmal nocturnal dyspnea.  There's been no change in his bowels such as constipation or diarrhea. He denies any melena or rectal bleeding  Muscle weakness is not associated with joint pain, swelling, or redness  There's been no new changes in hair, skin, nails  He denies any history of excessive snoring or frank apnea  The bruising was related to the fall; he has no abnormal bruising, bleeding or enlarged lymph nodes.  He is on Lexapro for depression.   He has some itchy, watery eyes.  Minor symptoms include some chest discomfort in relationship to the change in heart rate new  He describes muscle weakness.  He has had some chronic balance issues.       Objective:   Physical Exam  Positive or significant findings included pattern alopecia. He has a  Engineering geologist. He is generally barrel chested. There is marked lordosis with a sharply angulated curvature of the lower thoracic spine. Significant gynecomastia is present . He has lateral deviation of the fingers as well as interosseous wasting. Dorsalis pedis pulses are decreased without ischemic changes of the feet. He has negative straight leg raising but complained of back pain with elevation of the left lower extremity. There was marked decreased range of motion of the left knee. He exhibited the "low back crawl" reclining as well as sitting up from a supine position. He walks stooped over. With ambulation O2 sats dropped to 94% as he became dyspneic.   Gen.: adequately nourished in appearance. Head: Normocephalic without obvious abnormalities Eyes: No corneal or conjunctival inflammation noted. Pupils equal round reactive to light and accommodation.No icterus Ears: External  ear exam reveals no significant lesions or deformities.  Nose: External nasal exam reveals no deformity or inflammation. Nasal mucosa are pink and moist. No lesions or exudates noted.   Mouth: Oral mucosa and oropharynx reveal no lesions or exudates. Teeth in good repair. Neck: No deformities, masses, or tenderness noted. Thyroid normal. Lungs: Normal respiratory effort; chest expands symmetrically. Lungs are clear to auscultation without rales, wheezes, or increased work of breathing. Heart: Normal rate and rhythm. Normal S1 and S2. No gallop, click, or rub. No murmur. Abdomen: Bowel sounds normal; abdomen soft and nontender. No masses, organomegaly or hernias noted.  Musculoskeletal/extremities:  No clubbing, cyanosis, or edema noted. Able to lie down & sit up w/o help. Negative SLR bilaterally Vascular: Carotid, radial artery, dorsalis pedis and  posterior tibial pulses are  equal. No bruits present. Neurologic: oriented x3. Deep tendon reflexes symmetrical and normal.     Skin: Intact  without suspicious lesions or rashes.Papule w/o pustule L popliteal space Lymph: No cervical, axillary lymphadenopathy present. Psych: Mood and affect are normal. Normally interactive                                                                                        Assessment & Plan:  #1 fatigue  #2 exertional dyspnea  #3 palpitations  #4 history tick bite  See orders and after visit summary

## 2013-10-04 NOTE — Progress Notes (Signed)
Pre visit review using our clinic review tool, if applicable. No additional management support is needed unless otherwise documented below in the visit note. 

## 2013-10-05 ENCOUNTER — Other Ambulatory Visit: Payer: Self-pay | Admitting: Internal Medicine

## 2013-10-05 LAB — ROCKY MTN SPOTTED FVR AB, IGM-BLOOD: ROCKY MTN SPOTTED FEVER, IGM: 0.77 IV

## 2013-10-06 ENCOUNTER — Other Ambulatory Visit: Payer: Self-pay | Admitting: Internal Medicine

## 2013-10-06 DIAGNOSIS — M76899 Other specified enthesopathies of unspecified lower limb, excluding foot: Secondary | ICD-10-CM | POA: Diagnosis not present

## 2013-10-10 DIAGNOSIS — I82409 Acute embolism and thrombosis of unspecified deep veins of unspecified lower extremity: Secondary | ICD-10-CM | POA: Diagnosis not present

## 2013-10-10 DIAGNOSIS — I749 Embolism and thrombosis of unspecified artery: Secondary | ICD-10-CM | POA: Diagnosis not present

## 2013-10-10 DIAGNOSIS — Z7901 Long term (current) use of anticoagulants: Secondary | ICD-10-CM | POA: Diagnosis not present

## 2013-10-13 ENCOUNTER — Other Ambulatory Visit: Payer: Medicare Other

## 2013-10-13 DIAGNOSIS — M069 Rheumatoid arthritis, unspecified: Secondary | ICD-10-CM | POA: Diagnosis not present

## 2013-10-13 DIAGNOSIS — B20 Human immunodeficiency virus [HIV] disease: Secondary | ICD-10-CM | POA: Diagnosis not present

## 2013-10-13 LAB — COMPLETE METABOLIC PANEL WITH GFR
ALT: 22 U/L (ref 0–53)
AST: 26 U/L (ref 0–37)
Albumin: 4 g/dL (ref 3.5–5.2)
Alkaline Phosphatase: 65 U/L (ref 39–117)
BUN: 19 mg/dL (ref 6–23)
CO2: 27 mEq/L (ref 19–32)
Calcium: 9 mg/dL (ref 8.4–10.5)
Chloride: 97 mEq/L (ref 96–112)
Creat: 0.78 mg/dL (ref 0.50–1.35)
GFR, Est African American: >89 mL/min
GFR, Est Non African American: >89 mL/min
Glucose, Bld: 73 mg/dL (ref 70–99)
Potassium: 4.5 mEq/L (ref 3.5–5.3)
Sodium: 132 mEq/L — ABNORMAL LOW (ref 135–145)
Total Bilirubin: 0.8 mg/dL (ref 0.2–1.2)
Total Protein: 6 g/dL (ref 6.0–8.3)

## 2013-10-13 LAB — CBC WITH DIFFERENTIAL/PLATELET
Basophils Absolute: 0 10*3/uL (ref 0.0–0.1)
Basophils Relative: 0 % (ref 0–1)
Eosinophils Absolute: 0.1 10*3/uL (ref 0.0–0.7)
Eosinophils Relative: 1 % (ref 0–5)
HCT: 38.4 % — ABNORMAL LOW (ref 39.0–52.0)
Hemoglobin: 13.5 g/dL (ref 13.0–17.0)
Lymphocytes Relative: 62 % — ABNORMAL HIGH (ref 12–46)
Lymphs Abs: 3.3 10*3/uL (ref 0.7–4.0)
MCH: 39.9 pg — ABNORMAL HIGH (ref 26.0–34.0)
MCHC: 35.2 g/dL (ref 30.0–36.0)
MCV: 113.6 fL — ABNORMAL HIGH (ref 78.0–100.0)
Monocytes Absolute: 0.9 10*3/uL (ref 0.1–1.0)
Monocytes Relative: 16 % — ABNORMAL HIGH (ref 3–12)
Neutro Abs: 1.1 10*3/uL — ABNORMAL LOW (ref 1.7–7.7)
Neutrophils Relative %: 21 % — ABNORMAL LOW (ref 43–77)
Platelets: 144 10*3/uL — ABNORMAL LOW (ref 150–400)
RBC: 3.38 MIL/uL — ABNORMAL LOW (ref 4.22–5.81)
RDW: 15 % (ref 11.5–15.5)
WBC: 5.4 10*3/uL (ref 4.0–10.5)

## 2013-10-14 LAB — T-HELPER CELL (CD4) - (RCID CLINIC ONLY)
CD4 % Helper T Cell: 19 % — ABNORMAL LOW (ref 33–55)
CD4 T Cell Abs: 670 /uL (ref 400–2700)

## 2013-10-15 LAB — HIV-1 RNA QUANT-NO REFLEX-BLD
HIV 1 RNA Quant: <20 copies/mL (ref <20)
HIV-1 RNA Quant, Log: <1.3 {Log} (ref <1.30)

## 2013-10-17 ENCOUNTER — Other Ambulatory Visit: Payer: Self-pay | Admitting: Internal Medicine

## 2013-10-19 ENCOUNTER — Other Ambulatory Visit: Payer: Self-pay

## 2013-10-19 MED ORDER — ATORVASTATIN CALCIUM 10 MG PO TABS: 10.0000 mg | ORAL_TABLET | Freq: Every day | ORAL | Status: DC

## 2013-10-20 ENCOUNTER — Ambulatory Visit (HOSPITAL_COMMUNITY): Payer: Self-pay | Admitting: Psychiatry

## 2013-10-21 ENCOUNTER — Ambulatory Visit (INDEPENDENT_AMBULATORY_CARE_PROVIDER_SITE_OTHER): Payer: Medicare Other | Admitting: General Practice

## 2013-10-21 DIAGNOSIS — I82409 Acute embolism and thrombosis of unspecified deep veins of unspecified lower extremity: Secondary | ICD-10-CM

## 2013-10-21 DIAGNOSIS — Z5181 Encounter for therapeutic drug level monitoring: Secondary | ICD-10-CM

## 2013-10-21 LAB — POCT INR: INR: 3.5

## 2013-10-21 NOTE — Progress Notes (Signed)
Pre visit review using our clinic review tool, if applicable. No additional management support is needed unless otherwise documented below in the visit note. 

## 2013-10-27 ENCOUNTER — Ambulatory Visit (HOSPITAL_COMMUNITY): Payer: Self-pay | Admitting: Psychiatry

## 2013-10-27 ENCOUNTER — Ambulatory Visit (INDEPENDENT_AMBULATORY_CARE_PROVIDER_SITE_OTHER): Payer: Medicare Other | Admitting: Internal Medicine

## 2013-10-27 ENCOUNTER — Encounter: Payer: Self-pay | Admitting: Internal Medicine

## 2013-10-27 VITALS — BP 126/70 | HR 70 | Temp 97.8°F | Ht 68.0 in | Wt 183.0 lb

## 2013-10-27 DIAGNOSIS — B20 Human immunodeficiency virus [HIV] disease: Secondary | ICD-10-CM | POA: Diagnosis not present

## 2013-10-27 DIAGNOSIS — Z79899 Other long term (current) drug therapy: Secondary | ICD-10-CM

## 2013-10-27 DIAGNOSIS — I2510 Atherosclerotic heart disease of native coronary artery without angina pectoris: Secondary | ICD-10-CM

## 2013-10-27 DIAGNOSIS — Z23 Encounter for immunization: Secondary | ICD-10-CM | POA: Diagnosis not present

## 2013-10-27 DIAGNOSIS — G609 Hereditary and idiopathic neuropathy, unspecified: Secondary | ICD-10-CM

## 2013-10-27 DIAGNOSIS — Z5181 Encounter for therapeutic drug level monitoring: Secondary | ICD-10-CM

## 2013-10-27 DIAGNOSIS — Z113 Encounter for screening for infections with a predominantly sexual mode of transmission: Secondary | ICD-10-CM

## 2013-10-27 MED ORDER — GABAPENTIN 300 MG PO CAPS: 300.0000 mg | ORAL_CAPSULE | Freq: Four times a day (QID) | ORAL | Status: DC

## 2013-10-27 NOTE — Addendum Note (Signed)
Addended by: Landis Gandy on: 10/27/2013 11:43 AM   Modules accepted: Orders

## 2013-10-27 NOTE — Progress Notes (Signed)
  Subjective:    Patient ID: [name], male    DOB: [birth year], 63 y.o.   MRN: [id number]  HPI  He comes in for routine follow up.  He continues on Prezista with norvir, Combivir and Isentress.  He denies any missed doses.  He feels well on remicaide for RA.   He though continues to do well with his change of regimen with labs showing undetectable viral load and good CD4 count. No missed doses.  Having more problems with peripheral neuropathy.  Would like to try gabapentin.  Had hep B #1 last visit.     Review of Systems  Constitutional: Negative for fever and fatigue.  HENT: Negative for sore throat and trouble swallowing.   Eyes: Negative for visual disturbance.  Respiratory: Negative for shortness of breath.   Gastrointestinal: Negative for nausea, abdominal pain, diarrhea and abdominal distention.  Musculoskeletal: Positive for arthralgias.       Much improved on remicaide  Skin: Negative for rash.  Neurological: Negative for dizziness and headaches.  Hematological: Negative for adenopathy.  Psychiatric/Behavioral: Negative for dysphoric mood.       Objective:   Physical Exam  Constitutional: He is oriented to person, place, and time. He appears well-developed.  HENT:  Mouth/Throat: No oropharyngeal exudate.  Eyes: Right eye exhibits no discharge. Left eye exhibits no discharge. No scleral icterus.  Cardiovascular: Normal rate, regular rhythm and normal heart sounds.   No murmur heard. Pulmonary/Chest: Effort normal and breath sounds normal. No respiratory distress. He has no wheezes.  Musculoskeletal:  Spinal deformity Changes of RA in hands  Lymphadenopathy:    He has no cervical adenopathy.  Neurological: He is alert and oriented to person, place, and time.  Skin: Skin is warm and dry. No rash noted.  Psychiatric: He has a normal mood and affect. His behavior is normal.          Assessment & Plan:

## 2013-10-27 NOTE — Assessment & Plan Note (Signed)
Continues to do well.  RTC 4 months

## 2013-10-27 NOTE — Assessment & Plan Note (Signed)
Has trouble with burning of feet.  I will try to increase to 4 times per day neurontin.  If still has trouble, I will refer him back to neurology (has previously seen [facility]).

## 2013-10-28 DIAGNOSIS — M545 Low back pain, unspecified: Secondary | ICD-10-CM | POA: Diagnosis not present

## 2013-10-28 DIAGNOSIS — R269 Unspecified abnormalities of gait and mobility: Secondary | ICD-10-CM | POA: Diagnosis not present

## 2013-10-28 DIAGNOSIS — IMO0001 Reserved for inherently not codable concepts without codable children: Secondary | ICD-10-CM | POA: Diagnosis not present

## 2013-10-28 DIAGNOSIS — M6281 Muscle weakness (generalized): Secondary | ICD-10-CM | POA: Diagnosis not present

## 2013-11-02 ENCOUNTER — Inpatient Hospital Stay (HOSPITAL_COMMUNITY)
Admission: EM | Admit: 2013-11-02 | Discharge: 2013-11-04 | DRG: 194 | Disposition: A | Discharge status: Home / Self Care | Payer: Medicare Other | Attending: Family Medicine | Admitting: Family Medicine

## 2013-11-02 ENCOUNTER — Encounter (HOSPITAL_COMMUNITY): Payer: Self-pay | Admitting: Emergency Medicine

## 2013-11-02 ENCOUNTER — Emergency Department (HOSPITAL_COMMUNITY): Payer: Medicare Other

## 2013-11-02 DIAGNOSIS — Z951 Presence of aortocoronary bypass graft: Secondary | ICD-10-CM

## 2013-11-02 DIAGNOSIS — K219 Gastro-esophageal reflux disease without esophagitis: Secondary | ICD-10-CM | POA: Diagnosis present

## 2013-11-02 DIAGNOSIS — IMO0001 Reserved for inherently not codable concepts without codable children: Secondary | ICD-10-CM | POA: Diagnosis present

## 2013-11-02 DIAGNOSIS — I825Z9 Chronic embolism and thrombosis of unspecified deep veins of unspecified distal lower extremity: Secondary | ICD-10-CM | POA: Diagnosis present

## 2013-11-02 DIAGNOSIS — Z8042 Family history of malignant neoplasm of prostate: Secondary | ICD-10-CM | POA: Diagnosis not present

## 2013-11-02 DIAGNOSIS — Z8249 Family history of ischemic heart disease and other diseases of the circulatory system: Secondary | ICD-10-CM | POA: Diagnosis not present

## 2013-11-02 DIAGNOSIS — Z79899 Other long term (current) drug therapy: Secondary | ICD-10-CM

## 2013-11-02 DIAGNOSIS — I2510 Atherosclerotic heart disease of native coronary artery without angina pectoris: Secondary | ICD-10-CM | POA: Diagnosis present

## 2013-11-02 DIAGNOSIS — I25810 Atherosclerosis of coronary artery bypass graft(s) without angina pectoris: Secondary | ICD-10-CM | POA: Diagnosis present

## 2013-11-02 DIAGNOSIS — Z8673 Personal history of transient ischemic attack (TIA), and cerebral infarction without residual deficits: Secondary | ICD-10-CM

## 2013-11-02 DIAGNOSIS — Z7982 Long term (current) use of aspirin: Secondary | ICD-10-CM

## 2013-11-02 DIAGNOSIS — Z8371 Family history of colonic polyps: Secondary | ICD-10-CM | POA: Diagnosis not present

## 2013-11-02 DIAGNOSIS — E871 Hypo-osmolality and hyponatremia: Secondary | ICD-10-CM | POA: Diagnosis not present

## 2013-11-02 DIAGNOSIS — Z86718 Personal history of other venous thrombosis and embolism: Secondary | ICD-10-CM

## 2013-11-02 DIAGNOSIS — J189 Pneumonia, unspecified organism: Secondary | ICD-10-CM | POA: Diagnosis not present

## 2013-11-02 DIAGNOSIS — Z7901 Long term (current) use of anticoagulants: Secondary | ICD-10-CM

## 2013-11-02 DIAGNOSIS — B20 Human immunodeficiency virus [HIV] disease: Secondary | ICD-10-CM | POA: Diagnosis present

## 2013-11-02 DIAGNOSIS — M109 Gout, unspecified: Secondary | ICD-10-CM | POA: Diagnosis present

## 2013-11-02 DIAGNOSIS — G8929 Other chronic pain: Secondary | ICD-10-CM | POA: Diagnosis present

## 2013-11-02 DIAGNOSIS — C9110 Chronic lymphocytic leukemia of B-cell type not having achieved remission: Secondary | ICD-10-CM | POA: Diagnosis present

## 2013-11-02 DIAGNOSIS — Z21 Asymptomatic human immunodeficiency virus [HIV] infection status: Secondary | ICD-10-CM | POA: Diagnosis present

## 2013-11-02 DIAGNOSIS — R079 Chest pain, unspecified: Secondary | ICD-10-CM | POA: Diagnosis not present

## 2013-11-02 DIAGNOSIS — M069 Rheumatoid arthritis, unspecified: Secondary | ICD-10-CM | POA: Diagnosis present

## 2013-11-02 DIAGNOSIS — M1710 Unilateral primary osteoarthritis, unspecified knee: Secondary | ICD-10-CM | POA: Diagnosis present

## 2013-11-02 DIAGNOSIS — I10 Essential (primary) hypertension: Secondary | ICD-10-CM | POA: Diagnosis present

## 2013-11-02 DIAGNOSIS — M549 Dorsalgia, unspecified: Secondary | ICD-10-CM | POA: Diagnosis present

## 2013-11-02 DIAGNOSIS — I82409 Acute embolism and thrombosis of unspecified deep veins of unspecified lower extremity: Secondary | ICD-10-CM | POA: Diagnosis present

## 2013-11-02 DIAGNOSIS — R0789 Other chest pain: Secondary | ICD-10-CM | POA: Diagnosis not present

## 2013-11-02 DIAGNOSIS — R071 Chest pain on breathing: Secondary | ICD-10-CM | POA: Diagnosis not present

## 2013-11-02 DIAGNOSIS — I252 Old myocardial infarction: Secondary | ICD-10-CM | POA: Diagnosis not present

## 2013-11-02 DIAGNOSIS — Z833 Family history of diabetes mellitus: Secondary | ICD-10-CM | POA: Diagnosis not present

## 2013-11-02 DIAGNOSIS — Z803 Family history of malignant neoplasm of breast: Secondary | ICD-10-CM | POA: Diagnosis not present

## 2013-11-02 DIAGNOSIS — Z83719 Family history of colon polyps, unspecified: Secondary | ICD-10-CM | POA: Diagnosis not present

## 2013-11-02 LAB — BASIC METABOLIC PANEL
BUN: 18 mg/dL (ref 6–23)
CO2: 26 mEq/L (ref 19–32)
Calcium: 8.7 mg/dL (ref 8.4–10.5)
Chloride: 94 mEq/L — ABNORMAL LOW (ref 96–112)
Creatinine, Ser: 0.79 mg/dL (ref 0.50–1.35)
GFR calc Af Amer: >90 mL/min (ref >90)
GFR calc non Af Amer: >90 mL/min (ref >90)
Glucose, Bld: 118 mg/dL — ABNORMAL HIGH (ref 70–99)
Potassium: 3.8 mEq/L (ref 3.7–5.3)
Sodium: 131 mEq/L — ABNORMAL LOW (ref 137–147)

## 2013-11-02 LAB — CBC
HCT: 38.4 % — ABNORMAL LOW (ref 39.0–52.0)
Hemoglobin: 13.6 g/dL (ref 13.0–17.0)
MCH: 40.8 pg — ABNORMAL HIGH (ref 26.0–34.0)
MCHC: 35.4 g/dL (ref 30.0–36.0)
MCV: 115.3 fL — ABNORMAL HIGH (ref 78.0–100.0)
Platelets: 159 10*3/uL (ref 150–400)
RBC: 3.33 MIL/uL — ABNORMAL LOW (ref 4.22–5.81)
RDW: 13.6 % (ref 11.5–15.5)
WBC: 10.5 10*3/uL (ref 4.0–10.5)

## 2013-11-02 LAB — TROPONIN I
Troponin I: <0.3 ng/mL (ref <0.30)
Troponin I: <0.3 ng/mL (ref <0.30)

## 2013-11-02 LAB — GLUCOSE, CAPILLARY
Glucose-Capillary: 127 mg/dL — ABNORMAL HIGH (ref 70–99)
Glucose-Capillary: 110 mg/dL — ABNORMAL HIGH (ref 70–99)

## 2013-11-02 LAB — PROTIME-INR
INR: 2.17 — ABNORMAL HIGH (ref 0.00–1.49)
Prothrombin Time: 24.2 seconds — ABNORMAL HIGH (ref 11.6–15.2)

## 2013-11-02 LAB — PRO B NATRIURETIC PEPTIDE: Pro B Natriuretic peptide (BNP): 1157 pg/mL — ABNORMAL HIGH (ref 0–125)

## 2013-11-02 LAB — HEMOGLOBIN A1C
Hgb A1c MFr Bld: 5.1 % (ref <5.7)
Mean Plasma Glucose: 100 mg/dL (ref <117)

## 2013-11-02 MED ORDER — ONDANSETRON HCL 4 MG PO TABS: 4.0000 mg | ORAL_TABLET | Freq: Four times a day (QID) | ORAL | Status: DC | PRN

## 2013-11-02 MED ORDER — PANTOPRAZOLE SODIUM 40 MG PO TBEC
40.0000 mg | DELAYED_RELEASE_TABLET | Freq: Every day | ORAL | Status: DC
  Administered 2013-11-02 – 2013-11-04 (×3): 40 mg via ORAL
  Filled 2013-11-02 (×3): qty 1

## 2013-11-02 MED ORDER — ESCITALOPRAM OXALATE 10 MG PO TABS
10.0000 mg | ORAL_TABLET | Freq: Every day | ORAL | Status: DC
  Administered 2013-11-02 – 2013-11-04 (×3): 10 mg via ORAL
  Filled 2013-11-02 (×3): qty 1

## 2013-11-02 MED ORDER — DEXTROSE 5 % IV SOLN
500.0000 mg | INTRAVENOUS | Status: DC
  Administered 2013-11-02 – 2013-11-03 (×2): 500 mg via INTRAVENOUS
  Filled 2013-11-02 (×2): qty 500

## 2013-11-02 MED ORDER — HYDROCODONE-ACETAMINOPHEN 5-325 MG PO TABS
1.0000 | ORAL_TABLET | ORAL | Status: DC | PRN
  Administered 2013-11-02 – 2013-11-03 (×3): 1 via ORAL
  Filled 2013-11-02: qty 2
  Filled 2013-11-02 (×3): qty 1

## 2013-11-02 MED ORDER — INSULIN ASPART 100 UNIT/ML ~~LOC~~ SOLN: 0.0000 [IU] | Freq: Three times a day (TID) | SUBCUTANEOUS | Status: DC

## 2013-11-02 MED ORDER — ALUM & MAG HYDROXIDE-SIMETH 200-200-20 MG/5ML PO SUSP: 30.0000 mL | Freq: Four times a day (QID) | ORAL | Status: DC | PRN

## 2013-11-02 MED ORDER — METOPROLOL SUCCINATE ER 25 MG PO TB24
25.0000 mg | ORAL_TABLET | Freq: Every day | ORAL | Status: DC
  Administered 2013-11-02: 25 mg via ORAL
  Filled 2013-11-02 (×2): qty 1

## 2013-11-02 MED ORDER — ASPIRIN 81 MG PO CHEW
324.0000 mg | CHEWABLE_TABLET | Freq: Once | ORAL | Status: DC
  Filled 2013-11-02: qty 4

## 2013-11-02 MED ORDER — VALACYCLOVIR HCL 500 MG PO TABS
500.0000 mg | ORAL_TABLET | Freq: Every day | ORAL | Status: DC
  Administered 2013-11-02 – 2013-11-04 (×3): 500 mg via ORAL
  Filled 2013-11-02 (×6): qty 1

## 2013-11-02 MED ORDER — HYDROMORPHONE HCL PF 1 MG/ML IJ SOLN
0.5000 mg | Freq: Once | INTRAMUSCULAR | Status: AC
  Administered 2013-11-02: 0.5 mg via INTRAVENOUS
  Filled 2013-11-02: qty 1

## 2013-11-02 MED ORDER — DEXTROSE 5 % IV SOLN
1.0000 g | INTRAVENOUS | Status: DC
  Administered 2013-11-02 – 2013-11-03 (×2): 1 g via INTRAVENOUS
  Filled 2013-11-02 (×5): qty 10

## 2013-11-02 MED ORDER — CLONAZEPAM 0.5 MG PO TABS
0.5000 mg | ORAL_TABLET | Freq: Every day | ORAL | Status: DC | PRN
  Administered 2013-11-02: 0.5 mg via ORAL
  Filled 2013-11-02: qty 1

## 2013-11-02 MED ORDER — WARFARIN SODIUM 5 MG PO TABS
10.0000 mg | ORAL_TABLET | Freq: Once | ORAL | Status: AC
  Administered 2013-11-02: 10 mg via ORAL
  Filled 2013-11-02: qty 2

## 2013-11-02 MED ORDER — ONDANSETRON HCL 4 MG/2ML IJ SOLN
4.0000 mg | Freq: Once | INTRAMUSCULAR | Status: DC
Start: 2013-11-02 — End: 2013-11-02
  Filled 2013-11-02: qty 2

## 2013-11-02 MED ORDER — SIMVASTATIN 10 MG PO TABS
10.0000 mg | ORAL_TABLET | Freq: Every day | ORAL | Status: DC
Start: 2013-11-02 — End: 2013-11-02

## 2013-11-02 MED ORDER — ATORVASTATIN CALCIUM 10 MG PO TABS
10.0000 mg | ORAL_TABLET | Freq: Every day | ORAL | Status: DC
  Administered 2013-11-02 – 2013-11-03 (×2): 10 mg via ORAL
  Filled 2013-11-02 (×2): qty 1

## 2013-11-02 MED ORDER — ACETAMINOPHEN 325 MG PO TABS
650.0000 mg | ORAL_TABLET | Freq: Once | ORAL | Status: AC
  Administered 2013-11-02: 650 mg via ORAL
  Filled 2013-11-02: qty 2

## 2013-11-02 MED ORDER — SACCHAROMYCES BOULARDII 250 MG PO CAPS
250.0000 mg | ORAL_CAPSULE | Freq: Two times a day (BID) | ORAL | Status: DC
  Administered 2013-11-02 – 2013-11-04 (×3): 250 mg via ORAL
  Filled 2013-11-02 (×4): qty 1

## 2013-11-02 MED ORDER — GABAPENTIN 300 MG PO CAPS
300.0000 mg | ORAL_CAPSULE | Freq: Four times a day (QID) | ORAL | Status: DC
  Administered 2013-11-02 – 2013-11-04 (×7): 300 mg via ORAL
  Filled 2013-11-02 (×7): qty 1

## 2013-11-02 MED ORDER — ONDANSETRON HCL 4 MG/2ML IJ SOLN
4.0000 mg | Freq: Once | INTRAMUSCULAR | Status: AC
  Administered 2013-11-02: 4 mg via INTRAVENOUS

## 2013-11-02 MED ORDER — ONDANSETRON HCL 4 MG/2ML IJ SOLN: 4.0000 mg | Freq: Four times a day (QID) | INTRAMUSCULAR | Status: DC | PRN

## 2013-11-02 MED ORDER — LAMIVUDINE-ZIDOVUDINE 150-300 MG PO TABS
1.0000 | ORAL_TABLET | Freq: Two times a day (BID) | ORAL | Status: DC
  Administered 2013-11-02 – 2013-11-04 (×4): 1 via ORAL
  Filled 2013-11-02 (×10): qty 1

## 2013-11-02 MED ORDER — DEXTROSE 5 % IV SOLN
1.0000 g | Freq: Three times a day (TID) | INTRAVENOUS | Status: DC
  Administered 2013-11-02: 1 g via INTRAVENOUS
  Filled 2013-11-02 (×3): qty 1

## 2013-11-02 MED ORDER — ACETAMINOPHEN 325 MG PO TABS
650.0000 mg | ORAL_TABLET | Freq: Four times a day (QID) | ORAL | Status: DC | PRN
  Administered 2013-11-03: 650 mg via ORAL
  Filled 2013-11-02: qty 2

## 2013-11-02 MED ORDER — B COMPLEX-C PO TABS
1.0000 | ORAL_TABLET | Freq: Every day | ORAL | Status: DC
  Administered 2013-11-02 – 2013-11-04 (×3): 1 via ORAL
  Filled 2013-11-02 (×6): qty 1

## 2013-11-02 MED ORDER — WARFARIN - PHARMACIST DOSING INPATIENT: Status: DC

## 2013-11-02 MED ORDER — LORATADINE 10 MG PO TABS
10.0000 mg | ORAL_TABLET | Freq: Every day | ORAL | Status: DC
  Administered 2013-11-02 – 2013-11-04 (×3): 10 mg via ORAL
  Filled 2013-11-02 (×3): qty 1

## 2013-11-02 MED ORDER — SODIUM CHLORIDE 0.9 % IV SOLN
Freq: Once | INTRAVENOUS | Status: AC
  Administered 2013-11-02: 12:00:00 via INTRAVENOUS

## 2013-11-02 MED ORDER — DARUNAVIR ETHANOLATE 800 MG PO TABS
800.0000 mg | ORAL_TABLET | Freq: Every day | ORAL | Status: DC
  Filled 2013-11-02 (×3): qty 1

## 2013-11-02 MED ORDER — RALTEGRAVIR POTASSIUM 400 MG PO TABS
400.0000 mg | ORAL_TABLET | Freq: Two times a day (BID) | ORAL | Status: DC
  Administered 2013-11-02 – 2013-11-04 (×4): 400 mg via ORAL
  Filled 2013-11-02 (×10): qty 1

## 2013-11-02 MED ORDER — FLUTICASONE PROPIONATE 50 MCG/ACT NA SUSP
1.0000 | Freq: Every day | NASAL | Status: DC
  Administered 2013-11-03 – 2013-11-04 (×2): 1 via NASAL
  Filled 2013-11-02 (×2): qty 16

## 2013-11-02 MED ORDER — ASPIRIN EC 81 MG PO TBEC
81.0000 mg | DELAYED_RELEASE_TABLET | Freq: Every day | ORAL | Status: DC
  Administered 2013-11-02 – 2013-11-04 (×3): 81 mg via ORAL
  Filled 2013-11-02 (×3): qty 1

## 2013-11-02 MED ORDER — VANCOMYCIN HCL IN DEXTROSE 1-5 GM/200ML-% IV SOLN
1000.0000 mg | Freq: Two times a day (BID) | INTRAVENOUS | Status: DC
  Administered 2013-11-02: 1000 mg via INTRAVENOUS
  Filled 2013-11-02 (×2): qty 200

## 2013-11-02 MED ORDER — RITONAVIR 100 MG PO TABS
100.0000 mg | ORAL_TABLET | Freq: Every day | ORAL | Status: DC
  Administered 2013-11-03 – 2013-11-04 (×2): 100 mg via ORAL
  Filled 2013-11-02 (×5): qty 1

## 2013-11-02 MED ORDER — ACETAMINOPHEN 650 MG RE SUPP: 650.0000 mg | Freq: Four times a day (QID) | RECTAL | Status: DC | PRN

## 2013-11-02 MED ORDER — DARUNAVIR ETHANOLATE 800 MG PO TABS
800.0000 mg | ORAL_TABLET | Freq: Every day | ORAL | Status: DC
  Administered 2013-11-02 – 2013-11-04 (×3): 800 mg via ORAL
  Filled 2013-11-02 (×6): qty 1

## 2013-11-02 NOTE — H&P (Signed)
Patient seen, independently examined and chart reviewed. I agree with exam, assessment and plan discussed with Christopher Carrel, NP.  63 year old man presented with recent onset of left-sided chest pain, pleuritic, worse with deep breath, intermittent, subjective fever and chills at home. Initial evaluation suggested pneumonia.  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 CABG DVT, maintained on warfarin RA CLL   Objective:  Gen. Afebrile, vital signs stable. No hypoxia or tachypnea.  Psych. Alert. Grossly normal mood and affect. Speech fluent and appropriate.  Eyes. Grossly unremarkable  Cardiovascular. Regular rate and rhythm. No murmur, rub or gallop. No large edema.  Respiratory. Clear to auscultation bilaterally. No wheezes, rales or rhonchi. Normal respiratory effort.  Abdomen. Soft  Skin. No rash or induration seen.  Musculoskeletal. Grossly unremarkable  Neurologic. Grossly unremarkable   Chemistry: Sodium 131, chloride 94 suggestive of dehydration. Basic metabolic panel otherwise unremarkable  Heme: Hemoglobin within normal limits. No leukocytosis. INR 2.17.  History and clinical findings most consistent with developing left-sided pneumonia. Last hospitalization February of this year. HIV is well-controlled. I think at this point it is reasonable to treat as community-acquired rather than healthcare associated, therefore transitioned of Rocephin and Zithromax. Coronary artery disease appears well controlled, troponins negative thus far, repeat EKG normal sinus rhythm, septal infarct, age unknown, no acute changes compared to previous study earlier today.    Christopher Hodgkins, MD [facility] [phone]

## 2013-11-02 NOTE — Progress Notes (Signed)
ANTIBIOTIC CONSULT NOTE  Pharmacy Consult for Rocephin & Zithromax Indication: pneumonia  Allergies  Allergen Reactions  . Other Anaphylaxis and Hives    Pecan  . Peanut-Containing Drug Products Anaphylaxis and Hives    Swelling of throat  . Morphine     REACTION: severe headache  . Oxycodone-Acetaminophen     REACTION: headache  . Penicillins     REACTION: red, flushed  . Promethazine Hcl     REACTION: makes him feel drunk    Patient Measurements: Height: 5\' 9"  (175.3 cm) Weight: 182 lb 6.4 oz (82.736 kg) IBW/kg (Calculated) : 70.7   Vital Signs: Temp: 98.7 F (37.1 C) (06/24 1632) Temp src: Oral (06/24 1632) BP: 132/73 mmHg (06/24 1632) Pulse Rate: 79 (06/24 1632) Intake/Output from previous day:   Intake/Output from this shift:    Labs:  Recent Labs  11/02/13 1135  WBC 10.5  HGB 13.6  PLT 159  CREATININE 0.79   Estimated Creatinine Clearance: 92.1 ml/min (by C-G formula based on Cr of 0.79). No results found for this basename: VANCOTROUGH, VANCOPEAK, VANCORANDOM, GENTTROUGH, GENTPEAK, GENTRANDOM, TOBRATROUGH, TOBRAPEAK, TOBRARND, AMIKACINPEAK, AMIKACINTROU, AMIKACIN,  in the last 72 hours   Microbiology: No results found for this or any previous visit (from the past 720 hour(s)).  Medical History: Past Medical History  Diagnosis Date  . Osteoarthritis, knee     s/p B TKA  . HIV infection dx 1993  . TIA (transient ischemic attack) 1997    mild residual L mouth droop  . H/O hiatal hernia 2008    surgery  . Gynecomastia, male   . Impotence of organic origin   . Rheumatoid arthritis(714.0) dx 2010    MTX, follows with rheum  . Gout   . Chronic back pain     follows with Nsurg  . Coronary artery disease 2010    s/p CABG '10, sees Dr. Percival Spanish  . Seasonal allergies   . Diverticulosis   . Gallstones   . Status post dilation of esophageal narrowing   . GERD (gastroesophageal reflux disease)   . Diverticulosis   . Esophagitis   . Hemorrhoids    . Tubular adenoma of colon   . Myocardial infarction 2010     x 2  . Hypertension   . DVT, lower extremity, recurrent 2008, 2009    LLE, chronic anticoag since 2009  . Carotid artery occlusion     40-60% right ICA stenosis (09/2008)  . Neuromuscular disorder     neuropathy  . Fibromyalgia   . Hepatitis A yrs ago  . PONV (postoperative nausea and vomiting)   . HIV (human immunodeficiency virus infection)   . CLL (chronic lymphoblastic leukemia) dx 2010    Followed at [facility] q64mo, no current therapy     Medications:  Scheduled:  . aspirin EC  81 mg Oral Daily  . atorvastatin  10 mg Oral QHS  . azithromycin  500 mg Intravenous Q24H  . B-complex with vitamin C  1 tablet Oral Daily  . cefTRIAXone (ROCEPHIN)  IV  1 g Intravenous Q24H  . Darunavir Ethanolate  800 mg Oral Q breakfast  . escitalopram  10 mg Oral Daily  . fluticasone  1 spray Each Nare Daily  . gabapentin  300 mg Oral QID  . insulin aspart  0-9 Units Subcutaneous TID WC  . lamiVUDine-zidovudine  1 tablet Oral BID  . loratadine  10 mg Oral Daily  . metoprolol succinate  25 mg Oral QHS  . pantoprazole  40 mg Oral Daily  . raltegravir  400 mg Oral BID  . [START ON 11/03/2013] ritonavir  100 mg Oral Q breakfast  . saccharomyces boulardii  250 mg Oral BID  . valACYclovir  500 mg Oral Daily  . warfarin  10 mg Oral Once  . [START ON 11/03/2013] Warfarin - Pharmacist Dosing Inpatient   Does not apply Q24H    Assessment: 63 yo M admitted with PNA.  Hx HIV (last CD4 count 670).  He was initially started on antibiotics for HCAP by EDP.  Antibiotics changed to Rocephin & Zithromax on admission.  Discussed antibiotic choice for CAP vs HCAP with Dyanne Carrel, NP- states continue coverage for CAP only since patient doing well.      Vanc/Cefepime x1 dose 6/24>> Rocephin 6/25>> Zithromax 6/24>>  Goal of Therapy:  Eradicate infection.   Plan:  Rocephin 1gm IV q24h Zithromax 500mg  IV q24h Pharmacy to sign off.  Please  re-consult as needed.   Biagio Borg 11/02/2013,4:57 PM

## 2013-11-02 NOTE — ED Notes (Signed)
Central cp starting last night with n/v and sob.

## 2013-11-02 NOTE — Care Management Utilization Note (Signed)
UR completed 

## 2013-11-02 NOTE — Progress Notes (Signed)
ANTIBIOTIC CONSULT NOTE - INITIAL  Pharmacy Consult for Vancomycin & Cefepime Indication: pneumonia  Allergies  Allergen Reactions  . Other Anaphylaxis and Hives    Pecan  . Peanut-Containing Drug Products Anaphylaxis and Hives    Swelling of throat  . Morphine     REACTION: severe headache  . Oxycodone-Acetaminophen     REACTION: headache  . Penicillins     REACTION: red, flushed  . Promethazine Hcl     REACTION: makes him feel drunk    Patient Measurements: Height: 5\' 9"  (175.3 cm) Weight: 180 lb (81.647 kg) IBW/kg (Calculated) : 70.7 Adjusted Body Weight:    Vital Signs: Temp: 99.1 F (37.3 C) (06/24 1320) Temp src: Oral (06/24 1320) BP: 115/62 mmHg (06/24 1230) Pulse Rate: 85 (06/24 1230) Intake/Output from previous day:   Intake/Output from this shift:    Labs:  Recent Labs  11/02/13 1135  WBC 10.5  HGB 13.6  PLT 159  CREATININE 0.79   Estimated Creatinine Clearance: 92.1 ml/min (by C-G formula based on Cr of 0.79). No results found for this basename: VANCOTROUGH, VANCOPEAK, VANCORANDOM, GENTTROUGH, GENTPEAK, GENTRANDOM, TOBRATROUGH, TOBRAPEAK, TOBRARND, AMIKACINPEAK, AMIKACINTROU, AMIKACIN,  in the last 72 hours   Microbiology: No results found for this or any previous visit (from the past 720 hour(s)).  Medical History: Past Medical History  Diagnosis Date  . Osteoarthritis, knee     s/p B TKA  . HIV infection dx 1993  . TIA (transient ischemic attack) 1997    mild residual L mouth droop  . H/O hiatal hernia 2008    surgery  . Gynecomastia, male   . Impotence of organic origin   . Rheumatoid arthritis(714.0) dx 2010    MTX, follows with rheum  . Gout   . Chronic back pain     follows with Nsurg  . Coronary artery disease 2010    s/p CABG '10, sees Dr. Percival Spanish  . Seasonal allergies   . Diverticulosis   . Gallstones   . Status post dilation of esophageal narrowing   . GERD (gastroesophageal reflux disease)   . Diverticulosis   .  Esophagitis   . Hemorrhoids   . Tubular adenoma of colon   . Myocardial infarction 2010     x 2  . Hypertension   . DVT, lower extremity, recurrent 2008, 2009    LLE, chronic anticoag since 2009  . Carotid artery occlusion     40-60% right ICA stenosis (09/2008)  . Neuromuscular disorder     neuropathy  . Fibromyalgia   . Hepatitis A yrs ago  . PONV (postoperative nausea and vomiting)   . HIV (human immunodeficiency virus infection)   . CLL (chronic lymphoblastic leukemia) dx 2010    Followed at [facility] q57mo, no current therapy     Medications:  Scheduled:    Assessment: Vancomycin & Cefepime HCAP, HIV Labs reviewed PTA medications reviewed PCN  allergy noted: red, flushed RN alerted to observe   Goal of Therapy:  Vancomycin trough level 15-20 mcg/ml  Plan:  Vancomycin 1 GM IV every 12 hours Cefepime 1 GM IV every 8 hours Vancomycin trough at steady state Monitor renal function Labs per protocol  Abner Greenspan, Alexah Kivett Bennett 11/02/2013,2:28 PM

## 2013-11-02 NOTE — H&P (Signed)
[facility] History and Physical  DELBERT DARLEY YHC:[id number] DOB: [birth year] DOA: 11/02/2013  Referring physician:  PCP: [name], MD   Chief Complaint: Chest pain and shortness of breath  HPI: [name] is a 63 y.o. male with a past medical history that includes, hypertension, CAD status post CABG in 2010, HIV, CLL presents to the emergency department with chief complaint of chest pain and shortness of breath. Information is obtained from the patient. He reports that last night he developed sudden left sided chest pain. He describes the pain as a "tightness". He states the pain is nonradiating. Associated symptoms include mild shortness of breath due to the pain, nausea with 6 episodes of green/yellow emesis during the night, headache, myalgias, chills and subjective fever. He denies any cough dizziness syncope or near-syncope. He denies any lower extremity edema or orthopnea. He denies abdominal pain constipation diarrhea dysuria hematuria frequency or urgency. He reports he has been eating his normal amount. He denies any accidental weight loss. Evaluation in the emergency department yields a chest x-ray concerning for pneumonia, proBNP of 1157, sodium of 131, serum glucose of 118. Initial troponin is negative.   In the emergency department he is febrile with a temperature of 100.5, hemodynamically stable, he is not hypoxic. He is provided with cefepime and vancomycin intravenously, 500 cc bolus of normal saline, Zofran, Dilaudid.    Review of Systems:  10 point review of systems completed and all systems are negative except as indicated in the history of present illness Past Medical History  Diagnosis Date  . Osteoarthritis, knee     s/p B TKA  . HIV infection dx 1993  . TIA (transient ischemic attack) 1997    mild residual L mouth droop  . H/O hiatal hernia 2008    surgery  . Gynecomastia, male   . Impotence of organic origin   . Rheumatoid arthritis(714.0) dx  2010    MTX, follows with rheum  . Gout   . Chronic back pain     follows with Nsurg  . Coronary artery disease 2010    s/p CABG '10, sees Dr. Percival Spanish  . Seasonal allergies   . Diverticulosis   . Gallstones   . Status post dilation of esophageal narrowing   . GERD (gastroesophageal reflux disease)   . Diverticulosis   . Esophagitis   . Hemorrhoids   . Tubular adenoma of colon   . Myocardial infarction 2010     x 2  . Hypertension   . DVT, lower extremity, recurrent 2008, 2009    LLE, chronic anticoag since 2009  . Carotid artery occlusion     40-60% right ICA stenosis (09/2008)  . Neuromuscular disorder     neuropathy  . Fibromyalgia   . Hepatitis A yrs ago  . PONV (postoperative nausea and vomiting)   . HIV (human immunodeficiency virus infection)   . CLL (chronic lymphoblastic leukemia) dx 2010    Followed at [facility] q80mo, no current therapy    Past Surgical History  Procedure Laterality Date  . Spine surgery  2010    "rod and screws", "failed", lopwer spine,   . Cholecystectomy    . Hiatal hernia repair      wrap  . Shoulder surgery Left   . Mandible surgery Bilateral     tmj  . Varicose vein      stripping  . Replacement total knee Bilateral   . Knee arthroplasty  07/22/2011    Procedure: COMPUTER ASSISTED  TOTAL KNEE ARTHROPLASTY;  Surgeon: Meredith Pel, MD;  Location: [facility];  Service: Orthopedics;  Laterality: Right;  Right total knee arthroplasty  . Coronary artery bypass graft  2010    triple bypass  . Hardware removal N/A 07/02/2012    Procedure: HARDWARE REMOVAL;  Surgeon: Elaina Hoops, MD;  Location: [facility];  Service: Neurosurgery;  Laterality: N/A;  . Inguinal hernia repair Bilateral   . Umbilical hernia repair      x 1  . Rotator cuff repair Right   . Esophagogastroduodenoscopy (egd) with propofol N/A 12/28/2012    Procedure: ESOPHAGOGASTRODUODENOSCOPY (EGD) WITH PROPOFOL;  Surgeon: Jerene Bears, MD;  Location: [facility];  Service:  Gastroenterology;  Laterality: N/A;  . Colonoscopy with propofol N/A 12/28/2012    Procedure: COLONOSCOPY WITH PROPOFOL;  Surgeon: Jerene Bears, MD;  Location: [facility];  Service: Gastroenterology;  Laterality: N/A;  . Joint replacement Left 1999  . Tonsillectomy    . Ring around testicle hernia reapir  184 and 1986    x 2  . Esophagogastroduodenoscopy (egd) with propofol N/A 03/15/2013    Procedure: ESOPHAGOGASTRODUODENOSCOPY (EGD) WITH PROPOFOL;  Surgeon: Jerene Bears, MD;  Location: [facility];  Service: Gastroenterology;  Laterality: N/A;   Social History:  reports that he has never smoked. He has never used smokeless tobacco. He reports that he drinks alcohol. He reports that he does not use illicit drugs.  Allergies  Allergen Reactions  . Other Anaphylaxis and Hives    Pecan  . Peanut-Containing Drug Products Anaphylaxis and Hives    Swelling of throat  . Morphine     REACTION: severe headache  . Oxycodone-Acetaminophen     REACTION: headache  . Penicillins     REACTION: red, flushed  . Promethazine Hcl     REACTION: makes him feel drunk    Family History  Problem Relation Age of Onset  . Breast cancer Mother   . Hypertension Mother   . Hyperlipidemia Mother   . Diabetes Mother   . Prostate cancer Father   . Colon polyps Father   . Hyperlipidemia Father   . Crohn's disease Paternal Aunt   . Diabetes Maternal Grandmother   . Diabetes Brother     x 3  . Heart disease Brother     x 3  . Hyperlipidemia Brother     x 3  . Colon cancer Neg Hx   . Alcohol abuse Daughter   . Drug abuse Daughter      Prior to Admission medications   Medication Sig Start Date End Date Taking? Authorizing Provider  aspirin EC 81 MG tablet Take 81 mg by mouth daily.   Yes Historical Provider, MD  atorvastatin (LIPITOR) 10 MG tablet Take 1 tablet (10 mg total) by mouth at bedtime. 10/19/13  Yes [name], MD  B Complex-C (B-COMPLEX WITH VITAMIN C) tablet Take 1 tablet by mouth  daily.   Yes Historical Provider, MD  Calcium Carb-Cholecalciferol (CALCIUM 1000 + D PO) Take 1 tablet by mouth daily.   Yes Historical Provider, MD  Darunavir Ethanolate (PREZISTA) 800 MG tablet Take 1 tablet (800 mg total) by mouth daily. 06/22/13  Yes Thayer Headings, MD  escitalopram (LEXAPRO) 10 MG tablet Take 1 tablet (10 mg total) by mouth daily. 08/24/13 08/24/14 Yes Levonne Spiller, MD  gabapentin (NEURONTIN) 300 MG capsule Take 1 capsule (300 mg total) by mouth 4 (four) times daily. 10/27/13  Yes Thayer Headings, MD  ISENTRESS  400 MG tablet TAKE 1 TABLET BY MOUTH TWICE A DAY   Yes Thayer Headings, MD  lamiVUDine-zidovudine (COMBIVIR) 150-300 MG per tablet Take 1 tablet by mouth 2 (two) times daily. 06/22/13  Yes Thayer Headings, MD  loratadine (CLARITIN) 10 MG tablet Take 1 tablet (10 mg total) by mouth daily. 04/21/13  Yes Rowe Clack, MD  metFORMIN (GLUCOPHAGE XR) 500 MG 24 hr tablet Take 1 tablet (500 mg total) by mouth daily with breakfast. 07/08/13  Yes Rowe Clack, MD  metoprolol succinate (TOPROL-XL) 25 MG 24 hr tablet Take 25 mg by mouth at bedtime.   Yes Historical Provider, MD  mometasone (NASONEX) 50 MCG/ACT nasal spray Place 2 sprays into the nose daily. 04/21/13  Yes Rowe Clack, MD  pantoprazole (PROTONIX) 40 MG tablet Take 40 mg by mouth daily.   Yes Historical Provider, MD  ritonavir (NORVIR) 100 MG TABS tablet TAKE 1 TABLET BY MOUTH EVERY DAY 06/22/13  Yes Thayer Headings, MD  saccharomyces boulardii (FLORASTOR) 250 MG capsule Take 1 capsule (250 mg total) by mouth 2 (two) times daily. 04/15/13  Yes Jerene Bears, MD  valACYclovir (VALTREX) 500 MG tablet Take 1 tablet (500 mg total) by mouth daily. 03/15/13  Yes Thayer Headings, MD  warfarin (COUMADIN) 10 MG tablet Take 5-10 mg by mouth See admin instructions. Takes 1/2 tab (5 mg) on Wednesday takes 1 tablet (10 mg) all other days   Yes Historical Provider, MD  clonazePAM (KLONOPIN) 0.5 MG tablet Take one daily as needed  for anxiety 05/26/13   Levonne Spiller, MD  EPINEPHrine (EPI-PEN) 0.3 mg/0.3 mL SOAJ Inject 0.3 mg into the muscle once. As needed for allergic reaction    Historical Provider, MD  inFLIXimab (REMICADE) 100 MG injection Inject into the vein every 6 (six) weeks.     Historical Provider, MD  polyethylene glycol powder (GLYCOLAX/MIRALAX) powder One scoop daily as needed for constipation. 04/15/13   Jerene Bears, MD  tadalafil (CIALIS) 20 MG tablet Take 1 tablet (20 mg total) by mouth daily as needed for erectile dysfunction. 07/08/13   Rowe Clack, MD   Physical Exam: Filed Vitals:   11/02/13 1320  BP:   Pulse:   Temp: 99.1 F (37.3 C)  Resp:     BP 115/62  Pulse 85  Temp(Src) 99.1 F (37.3 C) (Oral)  Resp 14  Ht 5\' 9"  (1.753 m)  Wt 81.647 kg (180 lb)  BMI 26.57 kg/m2  SpO2 92%  General:  Appears calm and comfortable Eyes: PERRL, normal lids, irises & conjunctiva ENT: Ears clear nose without drainage oropharynx without erythema or exudate. Mucous membranes of his mouth are pink slightly dry Neck: no LAD, masses or thyromegaly Cardiovascular: RRR, no m/r/g. Trace lower extremity edema pedal pulses present and palpable Respiratory: Normal effort slightly shallow. Fair air movement. Crackles heard particularly in the left lower base. I hear no wheeze no rhonchi Abdomen: soft, ntnd positive bowel sounds throughout no guarding Skin: no rash or induration seen on limited exam Musculoskeletal: grossly normal tone BUE/BLE Psychiatric: grossly normal mood and affect, speech fluent and appropriate Neurologic: grossly non-focal.          Labs on Admission:  Basic Metabolic Panel:  Recent Labs Lab 11/02/13 1135  NA 131*  K 3.8  CL 94*  CO2 26  GLUCOSE 118*  BUN 18  CREATININE 0.79  CALCIUM 8.7   Liver Function Tests: No results found for this basename: AST, ALT,  ALKPHOS, BILITOT, PROT, ALBUMIN,  in the last 168 hours No results found for this basename: LIPASE, AMYLASE,  in  the last 168 hours No results found for this basename: AMMONIA,  in the last 168 hours CBC:  Recent Labs Lab 11/02/13 1135  WBC 10.5  HGB 13.6  HCT 38.4*  MCV 115.3*  PLT 159   Cardiac Enzymes:  Recent Labs Lab 11/02/13 1135  TROPONINI <0.30    BNP (last 3 results)  Recent Labs  11/02/13 1135  PROBNP 1157.0*   CBG: No results found for this basename: GLUCAP,  in the last 168 hours  Radiological Exams on Admission: Dg Chest 2 View  11/02/2013   CLINICAL DATA:  Tired, chest pain, history HIV, rheumatoid arthritis, GERD, coronary artery disease post MI, hypertension, CLL  EXAM: CHEST  2 VIEW  COMPARISON:  10/05/2012; correlation interval CT chest 05/19/2013  FINDINGS: Normal heart size post CABG.  Mediastinal contours and pulmonary vascularity normal.  Atelectasis versus developing consolidation in LEFT lower lobe.  Small hiatal hernia suspected.  Upper lungs clear.  No pleural effusion or pneumothorax.  Endplate spur formation thoracic spine.  IMPRESSION: Atelectasis versus consolidation LEFT lower lobe, new.  Suspect small hiatal hernia.   Electronically Signed   By: Lavonia Dana M.D.   On: 11/02/2013 12:34    EKG:   Assessment/Plan Principal Problem:   Healthcare-associated pneumonia: Chest x-ray with left lower lobe consolidation. Will treat as healthcare associated pneumonia do to patient's history of HIV and immunocompromise. Will provide Rocephin and azithromycin per pharmacy. Will obtain strep pneumo antigen Legionella urine antigen. Blood cultures are pending. Supportive therapy in the form of gentle IV fluids, pain medicine, oxygen supplementation as indicated, Zofran for nausea. He is hemodynamically stable and nontoxic appearing.  Active Problems: Hyponatremia; mild. Likely related to decreased by mouth intake and GI losses from emesis. Will provide gentle IV fluids. Will recheck in the a.m.  Hypertension; stable. Home medications include metoprolol. Will continue  this with parameters    CAD, NATIVE VESSEL: Initial troponin negative. Suspect his chest pain related to his left lower lobe pneumonia. Will obtain EKG cycle enzymes. Continue his home medications      DVT, lower extremity, recurrent: Coumadin per pharmacy. Is not hypoxic. Low suspicion for PE at this time given clinical presentation.    Chronic back pain: Stable at baseline. Will continue home medication    GERD (gastroesophageal reflux disease): Stable at baseline continue home medications    HIV (human immunodeficiency virus infection): Stable/doing well per recent infectious disease progress note.      Code Status: full Family Communication: daughter at bedside Disposition Plan: home when ready  Time spent: 85 minutes  [facility] Pager [phone]  **Disclaimer: This note may have been dictated with voice recognition software. Similar sounding words can inadvertently be transcribed and this note may contain transcription errors which may not have been corrected upon publication of note.**

## 2013-11-02 NOTE — Progress Notes (Signed)
ANTICOAGULATION CONSULT NOTE - Initial Consult  Pharmacy Consult for Coumadin Indication: VTE prophylaxis  Allergies  Allergen Reactions  . Other Anaphylaxis and Hives    Pecan  . Peanut-Containing Drug Products Anaphylaxis and Hives    Swelling of throat  . Morphine     REACTION: severe headache  . Oxycodone-Acetaminophen     REACTION: headache  . Penicillins     REACTION: red, flushed  . Promethazine Hcl     REACTION: makes him feel drunk    Patient Measurements: Height: 5\' 9"  (175.3 cm) Weight: 182 lb 6.4 oz (82.736 kg) IBW/kg (Calculated) : 70.7  Vital Signs: Temp: 98.7 F (37.1 C) (06/24 1632) Temp src: Oral (06/24 1632) BP: 132/73 mmHg (06/24 1632) Pulse Rate: 79 (06/24 1632)  Labs:  Recent Labs  11/02/13 1135  HGB 13.6  HCT 38.4*  PLT 159  LABPROT 24.2*  INR 2.17*  CREATININE 0.79  TROPONINI <0.30    Estimated Creatinine Clearance: 92.1 ml/min (by C-G formula based on Cr of 0.79).   Medical History: Past Medical History  Diagnosis Date  . Osteoarthritis, knee     s/p B TKA  . HIV infection dx 1993  . TIA (transient ischemic attack) 1997    mild residual L mouth droop  . H/O hiatal hernia 2008    surgery  . Gynecomastia, male   . Impotence of organic origin   . Rheumatoid arthritis(714.0) dx 2010    MTX, follows with rheum  . Gout   . Chronic back pain     follows with Nsurg  . Coronary artery disease 2010    s/p CABG '10, sees Dr. Percival Spanish  . Seasonal allergies   . Diverticulosis   . Gallstones   . Status post dilation of esophageal narrowing   . GERD (gastroesophageal reflux disease)   . Diverticulosis   . Esophagitis   . Hemorrhoids   . Tubular adenoma of colon   . Myocardial infarction 2010     x 2  . Hypertension   . DVT, lower extremity, recurrent 2008, 2009    LLE, chronic anticoag since 2009  . Carotid artery occlusion     40-60% right ICA stenosis (09/2008)  . Neuromuscular disorder     neuropathy  . Fibromyalgia    . Hepatitis A yrs ago  . PONV (postoperative nausea and vomiting)   . HIV (human immunodeficiency virus infection)   . CLL (chronic lymphoblastic leukemia) dx 2010    Followed at [facility] q34[facility], no current therapy     Medications:  Prescriptions prior to admission  Medication Sig Dispense Refill  . aspirin EC 81 MG tablet Take 81 mg by mouth daily.      [name] atorvastatin (LIPITOR) 10 MG tablet Take 1 tablet (10 mg total) by mouth at bedtime.  30 tablet  6  . B Complex-C (B-COMPLEX WITH VITAMIN C) tablet Take 1 tablet by mouth daily.      . Calcium Carb-Cholecalciferol (CALCIUM 1000 + D PO) Take 1 tablet by mouth daily.      . Darunavir Ethanolate (PREZISTA) 800 MG tablet Take 1 tablet (800 mg total) by mouth daily.  30 tablet  5  . escitalopram (LEXAPRO) 10 MG tablet Take 1 tablet (10 mg total) by mouth daily.  90 tablet  2  . gabapentin (NEURONTIN) 300 MG capsule Take 1 capsule (300 mg total) by mouth 4 (four) times daily.  120 capsule  5  . ISENTRESS 400 MG tablet TAKE 1 TABLET BY  MOUTH TWICE A DAY  60 tablet  5  . lamiVUDine-zidovudine (COMBIVIR) 150-300 MG per tablet Take 1 tablet by mouth 2 (two) times daily.  60 tablet  5  . loratadine (CLARITIN) 10 MG tablet Take 1 tablet (10 mg total) by mouth daily.  30 tablet  11  . metFORMIN (GLUCOPHAGE XR) 500 MG 24 hr tablet Take 1 tablet (500 mg total) by mouth daily with breakfast.  90 tablet  3  . metoprolol succinate (TOPROL-XL) 25 MG 24 hr tablet Take 25 mg by mouth at bedtime.      . mometasone (NASONEX) 50 MCG/ACT nasal spray Place 2 sprays into the nose daily.  17 g  12  . pantoprazole (PROTONIX) 40 MG tablet Take 40 mg by mouth daily.      . ritonavir (NORVIR) 100 MG TABS tablet TAKE 1 TABLET BY MOUTH EVERY DAY  30 tablet  5  . saccharomyces boulardii (FLORASTOR) 250 MG capsule Take 1 capsule (250 mg total) by mouth 2 (two) times daily.  60 capsule  3  . valACYclovir (VALTREX) 500 MG tablet Take 1 tablet (500 mg total) by mouth daily.  30  tablet  3  . warfarin (COUMADIN) 10 MG tablet Take 5-10 mg by mouth See admin instructions. Takes 1/2 tab (5 mg) on Wednesday takes 1 tablet (10 mg) all other days      . clonazePAM (KLONOPIN) 0.5 MG tablet Take one daily as needed for anxiety  60 tablet  2  . EPINEPHrine (EPI-PEN) 0.3 mg/0.3 mL SOAJ Inject 0.3 mg into the muscle once. As needed for allergic reaction      . inFLIXimab (REMICADE) 100 MG injection Inject into the vein every 6 (six) weeks.       . polyethylene glycol powder (GLYCOLAX/MIRALAX) powder One scoop daily as needed for constipation.  255 g  3  . tadalafil (CIALIS) 20 MG tablet Take 1 tablet (20 mg total) by mouth daily as needed for erectile dysfunction.  10 tablet  0    Assessment: 63 yo M on chronic warfarin for hx recurrent DVTs.  INR is therapeutic on admission. Home dose listed above.  No bleeding noted.  Patient is admitted with PNA and on antibiotics.  Goal of Therapy:  INR 2-3   Plan:  Coumadin 10mg  po x1 today Daily INR  Biagio Borg 11/02/2013,4:39 PM

## 2013-11-02 NOTE — Progress Notes (Signed)
Pt c/o severe 10/10 ribcage pain, describing as a "kink in my side".  Pt continuously screaming out in pain, emotional support given. Pain and anti anxiety medicine gave per PRN orders. 12 lead EKG STAT ordered per protocol. NSR noted, 84 bpm.  Will make MD aware.

## 2013-11-02 NOTE — ED Provider Notes (Signed)
CSN: [id number]     Arrival date & time 11/02/13  1116 History  This chart was scribed for Nat Christen, MD by Vernell Barrier, ED scribe. This patient was seen in room APA08/APA08 and the patient's care was started at 11:54 AM.    Chief Complaint  Patient presents with  . Chest Pain   The history is provided by the patient, a friend and a relative. No language interpreter was used.   HPI Comments: [name] is a 63 y.o. male who presents to the Emergency Department complaining of left sided chest tightness w/ SOB due to pain. Also reports some mild abdominal pain, severe HA and myalgias. 6-7 episodes of yellow/green emesis over the past 10 hours. Unable to keep fluids down. Currently running a fever. Reports chills last night but did not start feeling really bad until this morning. Says sxs are unusual minus chest tightness. Reports similar chest tightness when he had his heart attack in 2010. Denies leg swelling. Low-grade fever.  Severity is moderate.  Nothing makes symptoms better or worse.  PCP: [facility] Asa Lente at [facility]  Past Medical History  Diagnosis Date  . Osteoarthritis, knee     s/p B TKA  . HIV infection dx 1993  . TIA (transient ischemic attack) 1997    mild residual L mouth droop  . H/O hiatal hernia 2008    surgery  . Gynecomastia, male   . Impotence of organic origin   . Rheumatoid arthritis(714.0) dx 2010    MTX, follows with rheum  . Gout   . Chronic back pain     follows with Nsurg  . Coronary artery disease 2010    s/p CABG '10, sees Dr. Percival Spanish  . Seasonal allergies   . Diverticulosis   . Gallstones   . Status post dilation of esophageal narrowing   . GERD (gastroesophageal reflux disease)   . Diverticulosis   . Esophagitis   . Hemorrhoids   . Tubular adenoma of colon   . Myocardial infarction 2010     x 2  . Hypertension   . DVT, lower extremity, recurrent 2008, 2009    LLE, chronic anticoag since 2009  . Carotid artery occlusion     40-60% right  ICA stenosis (09/2008)  . Neuromuscular disorder     neuropathy  . Fibromyalgia   . Hepatitis A yrs ago  . PONV (postoperative nausea and vomiting)   . HIV (human immunodeficiency virus infection)   . CLL (chronic lymphoblastic leukemia) dx 2010    Followed at [facility] q[facility], no current therapy    Past Surgical History  Procedure Laterality Date  . Spine surgery  2010    "rod and screws", "failed", lopwer spine,   . Cholecystectomy    . Hiatal hernia repair      wrap  . Shoulder surgery Left   . Mandible surgery Bilateral     tmj  . Varicose vein      stripping  . Replacement total knee Bilateral   . Knee arthroplasty  07/22/2011    Procedure: COMPUTER ASSISTED TOTAL KNEE ARTHROPLASTY;  Surgeon: Meredith Pel, MD;  Location: [facility];  Service: Orthopedics;  Laterality: Right;  Right total knee arthroplasty  . Coronary artery bypass graft  2010    triple bypass  . Hardware removal N/A 07/02/2012    Procedure: HARDWARE REMOVAL;  Surgeon: Elaina Hoops, MD;  Location: [place] [facility];  Service: Neurosurgery;  Laterality: N/A;  . Inguinal hernia repair Bilateral   .  Umbilical hernia repair      x 1  . Rotator cuff repair Right   . Esophagogastroduodenoscopy (egd) with propofol N/A 12/28/2012    Procedure: ESOPHAGOGASTRODUODENOSCOPY (EGD) WITH PROPOFOL;  Surgeon: Jerene Bears, MD;  Location: [facility];  Service: Gastroenterology;  Laterality: N/A;  . Colonoscopy with propofol N/A 12/28/2012    Procedure: COLONOSCOPY WITH PROPOFOL;  Surgeon: Jerene Bears, MD;  Location: [facility];  Service: Gastroenterology;  Laterality: N/A;  . Joint replacement Left 1999  . Tonsillectomy    . Ring around testicle hernia reapir  184 and 1986    x 2  . Esophagogastroduodenoscopy (egd) with propofol N/A 03/15/2013    Procedure: ESOPHAGOGASTRODUODENOSCOPY (EGD) WITH PROPOFOL;  Surgeon: Jerene Bears, MD;  Location: [facility];  Service: Gastroenterology;  Laterality: N/A;   Family History  Problem  Relation Age of Onset  . Breast cancer Mother   . Hypertension Mother   . Hyperlipidemia Mother   . Diabetes Mother   . Prostate cancer Father   . Colon polyps Father   . Hyperlipidemia Father   . Crohn's disease Paternal Aunt   . Diabetes Maternal Grandmother   . Diabetes Brother     x 3  . Heart disease Brother     x 3  . Hyperlipidemia Brother     x 3  . Colon cancer Neg Hx   . Alcohol abuse Daughter   . Drug abuse Daughter    History  Substance Use Topics  . Smoking status: Never Smoker   . Smokeless tobacco: Never Used     Comment: occ wine  . Alcohol Use: Yes     Comment: occasional wine- 1-2 per week    Review of Systems  Respiratory: Positive for chest tightness and shortness of breath.   Cardiovascular: Positive for chest pain. Negative for leg swelling.  Gastrointestinal: Positive for nausea, vomiting and abdominal pain.  Musculoskeletal: Positive for myalgias.  Neurological: Positive for headaches.   A complete 10 system review of systems was obtained and all systems are negative except as noted in the HPI and PMH.   Allergies  Other; Peanut-containing drug products; Morphine; Oxycodone-acetaminophen; Penicillins; and Promethazine hcl  Home Medications   Prior to Admission medications   Medication Sig Start Date End Date Taking? Authorizing Provider  aspirin EC 81 MG tablet Take 81 mg by mouth daily.   Yes Historical Provider, MD  atorvastatin (LIPITOR) 10 MG tablet Take 1 tablet (10 mg total) by mouth at bedtime. 10/19/13  Yes [name], MD  B Complex-C (B-COMPLEX WITH VITAMIN C) tablet Take 1 tablet by mouth daily.   Yes Historical Provider, MD  Calcium Carb-Cholecalciferol (CALCIUM 1000 + D PO) Take 1 tablet by mouth daily.   Yes Historical Provider, MD  Darunavir Ethanolate (PREZISTA) 800 MG tablet Take 1 tablet (800 mg total) by mouth daily. 06/22/13  Yes Thayer Headings, MD  escitalopram (LEXAPRO) 10 MG tablet Take 1 tablet (10 mg total) by mouth  daily. 08/24/13 08/24/14 Yes Levonne Spiller, MD  gabapentin (NEURONTIN) 300 MG capsule Take 1 capsule (300 mg total) by mouth 4 (four) times daily. 10/27/13  Yes Thayer Headings, MD  ISENTRESS 400 MG tablet TAKE 1 TABLET BY MOUTH TWICE A DAY   Yes Thayer Headings, MD  lamiVUDine-zidovudine (COMBIVIR) 150-300 MG per tablet Take 1 tablet by mouth 2 (two) times daily. 06/22/13  Yes Thayer Headings, MD  loratadine (CLARITIN) 10 MG tablet Take 1 tablet (10 mg  total) by mouth daily. 04/21/13  Yes Rowe Clack, MD  metFORMIN (GLUCOPHAGE XR) 500 MG 24 hr tablet Take 1 tablet (500 mg total) by mouth daily with breakfast. 07/08/13  Yes Rowe Clack, MD  metoprolol succinate (TOPROL-XL) 25 MG 24 hr tablet Take 25 mg by mouth at bedtime.   Yes Historical Provider, MD  mometasone (NASONEX) 50 MCG/ACT nasal spray Place 2 sprays into the nose daily. 04/21/13  Yes Rowe Clack, MD  pantoprazole (PROTONIX) 40 MG tablet Take 40 mg by mouth daily.   Yes Historical Provider, MD  ritonavir (NORVIR) 100 MG TABS tablet TAKE 1 TABLET BY MOUTH EVERY DAY 06/22/13  Yes Thayer Headings, MD  saccharomyces boulardii (FLORASTOR) 250 MG capsule Take 1 capsule (250 mg total) by mouth 2 (two) times daily. 04/15/13  Yes Jerene Bears, MD  valACYclovir (VALTREX) 500 MG tablet Take 1 tablet (500 mg total) by mouth daily. 03/15/13  Yes Thayer Headings, MD  warfarin (COUMADIN) 10 MG tablet Take 5-10 mg by mouth See admin instructions. Takes 1/2 tab (5 mg) on Wednesday takes 1 tablet (10 mg) all other days   Yes Historical Provider, MD  clonazePAM (KLONOPIN) 0.5 MG tablet Take one daily as needed for anxiety 05/26/13   Levonne Spiller, MD  EPINEPHrine (EPI-PEN) 0.3 mg/0.3 mL SOAJ Inject 0.3 mg into the muscle once. As needed for allergic reaction    Historical Provider, MD  inFLIXimab (REMICADE) 100 MG injection Inject into the vein every 6 (six) weeks.     Historical Provider, MD  polyethylene glycol powder (GLYCOLAX/MIRALAX) powder One  scoop daily as needed for constipation. 04/15/13   Jerene Bears, MD  tadalafil (CIALIS) 20 MG tablet Take 1 tablet (20 mg total) by mouth daily as needed for erectile dysfunction. 07/08/13   Rowe Clack, MD   Triage vitals: BP 143/69  Pulse 92  Temp(Src) 100.5 F (38.1 C) (Oral)  Resp 16  Ht 5\' 9"  (1.753 m)  Wt 180 lb (81.647 kg)  BMI 26.57 kg/m2  SpO2 93%  Physical Exam  Nursing note and vitals reviewed. Constitutional: He is oriented to person, place, and time. He appears well-developed and well-nourished.  HENT:  Head: Normocephalic and atraumatic.  Eyes: Conjunctivae and EOM are normal. Pupils are equal, round, and reactive to light.  Neck: Normal range of motion. Neck supple.  Cardiovascular: Normal rate, regular rhythm and normal heart sounds.   No murmur heard. Pulmonary/Chest: Effort normal and breath sounds normal.  Abdominal: Soft. Bowel sounds are normal.  Musculoskeletal: Normal range of motion.  Neurological: He is alert and oriented to person, place, and time.  Skin: Skin is warm and dry.  Psychiatric: He has a normal mood and affect. His behavior is normal.    ED Course  Procedures (including critical care time) DIAGNOSTIC STUDIES: Oxygen Saturation is 93% on room air, normal by my interpretation.    COORDINATION OF CARE: At 11:57 AM: Discussed treatment plan with patient which includes test for cardia enzymes, chest x-ray, EKG, and blood work. Will also receive IV fluids, pain medication, and Zofran for nausea.  Patient agrees.    Labs Review Results for orders placed during the hospital encounter of 11/02/13  CBC      Result Value Ref Range   WBC 10.5  4.0 - 10.5 K/uL   RBC 3.33 (*) 4.22 - 5.81 MIL/uL   Hemoglobin 13.6  13.0 - 17.0 g/dL   HCT 38.4 (*) 39.0 - 52.0 %  MCV 115.3 (*) 78.0 - 100.0 fL   MCH 40.8 (*) 26.0 - 34.0 pg   MCHC 35.4  30.0 - 36.0 g/dL   RDW 13.6  11.5 - 15.5 %   Platelets 159  150 - 400 K/uL  BASIC METABOLIC PANEL       Result Value Ref Range   Sodium 131 (*) 137 - 147 mEq/L   Potassium 3.8  3.7 - 5.3 mEq/L   Chloride 94 (*) 96 - 112 mEq/L   CO2 26  19 - 32 mEq/L   Glucose, Bld 118 (*) 70 - 99 mg/dL   BUN 18  6 - 23 mg/dL   Creatinine, Ser 0.79  0.50 - 1.35 mg/dL   Calcium 8.7  8.4 - 10.5 mg/dL   GFR calc non Af Amer >90  >90 mL/min   GFR calc Af Amer >90  >90 mL/min  PRO B NATRIURETIC PEPTIDE      Result Value Ref Range   Pro B Natriuretic peptide (BNP) 1157.0 (*) 0 - 125 pg/mL  TROPONIN I      Result Value Ref Range   Troponin I <0.30  <0.30 ng/mL   Dg Chest 2 View  11/02/2013   CLINICAL DATA:  Tired, chest pain, history HIV, rheumatoid arthritis, GERD, coronary artery disease post MI, hypertension, CLL  EXAM: CHEST  2 VIEW  COMPARISON:  10/05/2012; correlation interval CT chest 05/19/2013  FINDINGS: Normal heart size post CABG.  Mediastinal contours and pulmonary vascularity normal.  Atelectasis versus developing consolidation in LEFT lower lobe.  Small hiatal hernia suspected.  Upper lungs clear.  No pleural effusion or pneumothorax.  Endplate spur formation thoracic spine.  IMPRESSION: Atelectasis versus consolidation LEFT lower lobe, new.  Suspect small hiatal hernia.   Electronically Signed   By: Lavonia Dana M.D.   On: 11/02/2013 12:34    EKG Interpretation None      MDM   Final diagnoses:  Healthcare-associated pneumonia   patient is hemodynamically stable, but immunocompromised.  Chest x-ray shows a left lower lobe consolidation.  Admit for healthcare associated pneumonia.  Rx IV vancomycin, IV Maxipime.  I personally performed the services described in this documentation, which was scribed in my presence. The recorded information has been reviewed and is accurate.     Nat Christen, MD 11/02/13 1432

## 2013-11-03 DIAGNOSIS — Z21 Asymptomatic human immunodeficiency virus [HIV] infection status: Secondary | ICD-10-CM | POA: Diagnosis not present

## 2013-11-03 DIAGNOSIS — J189 Pneumonia, unspecified organism: Secondary | ICD-10-CM | POA: Diagnosis not present

## 2013-11-03 LAB — BASIC METABOLIC PANEL
BUN: 14 mg/dL (ref 6–23)
CO2: 27 mEq/L (ref 19–32)
Calcium: 7.6 mg/dL — ABNORMAL LOW (ref 8.4–10.5)
Chloride: 95 mEq/L — ABNORMAL LOW (ref 96–112)
Creatinine, Ser: 0.75 mg/dL (ref 0.50–1.35)
GFR calc Af Amer: >90 mL/min (ref >90)
GFR calc non Af Amer: >90 mL/min (ref >90)
Glucose, Bld: 101 mg/dL — ABNORMAL HIGH (ref 70–99)
Potassium: 3.9 mEq/L (ref 3.7–5.3)
Sodium: 130 mEq/L — ABNORMAL LOW (ref 137–147)

## 2013-11-03 LAB — STREP PNEUMONIAE URINARY ANTIGEN: Strep Pneumo Urinary Antigen: NEGATIVE

## 2013-11-03 LAB — CBC
HCT: 32.1 % — ABNORMAL LOW (ref 39.0–52.0)
Hemoglobin: 11.2 g/dL — ABNORMAL LOW (ref 13.0–17.0)
MCH: 40.1 pg — ABNORMAL HIGH (ref 26.0–34.0)
MCHC: 34.9 g/dL (ref 30.0–36.0)
MCV: 115.1 fL — ABNORMAL HIGH (ref 78.0–100.0)
Platelets: 129 10*3/uL — ABNORMAL LOW (ref 150–400)
RBC: 2.79 MIL/uL — ABNORMAL LOW (ref 4.22–5.81)
RDW: 13.7 % (ref 11.5–15.5)
WBC: 8.3 10*3/uL (ref 4.0–10.5)

## 2013-11-03 LAB — TROPONIN I: Troponin I: <0.3 ng/mL (ref <0.30)

## 2013-11-03 LAB — GLUCOSE, CAPILLARY
Glucose-Capillary: 97 mg/dL (ref 70–99)
Glucose-Capillary: 97 mg/dL (ref 70–99)

## 2013-11-03 LAB — PROTIME-INR
INR: 3.24 — ABNORMAL HIGH (ref 0.00–1.49)
Prothrombin Time: 33.1 seconds — ABNORMAL HIGH (ref 11.6–15.2)

## 2013-11-03 MED ORDER — METOPROLOL SUCCINATE ER 25 MG PO TB24: 25.0000 mg | ORAL_TABLET | Freq: Every day | ORAL | Status: DC

## 2013-11-03 MED ORDER — WARFARIN SODIUM 2.5 MG PO TABS
2.5000 mg | ORAL_TABLET | Freq: Once | ORAL | Status: AC
  Administered 2013-11-03: 2.5 mg via ORAL
  Filled 2013-11-03: qty 1

## 2013-11-03 NOTE — Progress Notes (Signed)
Export for Coumadin Indication: VTE prophylaxis  Allergies  Allergen Reactions  . Other Anaphylaxis and Hives    Pecan  . Peanut-Containing Drug Products Anaphylaxis and Hives    Swelling of throat  . Morphine     REACTION: severe headache  . Oxycodone-Acetaminophen     REACTION: headache  . Penicillins     REACTION: red, flushed  . Promethazine Hcl     REACTION: makes him feel drunk    Patient Measurements: Height: 5\' 9"  (175.3 cm) Weight: 182 lb 6.4 oz (82.736 kg) IBW/kg (Calculated) : 70.7  Vital Signs: Temp: 98.1 F (36.7 C) (06/25 0653) Temp src: Oral (06/25 0653) BP: 108/66 mmHg (06/25 0653) Pulse Rate: 79 (06/25 0653)  Labs:  Recent Labs  11/02/13 1135 11/02/13 1735 11/02/13 2325 11/03/13 0516  HGB 13.6  --   --  11.2*  HCT 38.4*  --   --  32.1*  PLT 159  --   --  129*  LABPROT 24.2*  --   --  33.1*  INR 2.17*  --   --  3.24*  CREATININE 0.79  --   --  0.75  TROPONINI <0.30 <0.30 <0.30  --     Estimated Creatinine Clearance: 92.1 ml/min (by C-G formula based on Cr of 0.75).   Medical History: Past Medical History  Diagnosis Date  . Osteoarthritis, knee     s/p B TKA  . HIV infection dx 1993  . TIA (transient ischemic attack) 1997    mild residual L mouth droop  . H/O hiatal hernia 2008    surgery  . Gynecomastia, male   . Impotence of organic origin   . Rheumatoid arthritis(714.0) dx 2010    MTX, follows with rheum  . Gout   . Chronic back pain     follows with Nsurg  . Coronary artery disease 2010    s/p CABG '10, sees Dr. Percival Spanish  . Seasonal allergies   . Diverticulosis   . Gallstones   . Status post dilation of esophageal narrowing   . GERD (gastroesophageal reflux disease)   . Diverticulosis   . Esophagitis   . Hemorrhoids   . Tubular adenoma of colon   . Myocardial infarction 2010     x 2  . Hypertension   . DVT, lower extremity, recurrent 2008, 2009    LLE, chronic anticoag since  2009  . Carotid artery occlusion     40-60% right ICA stenosis (09/2008)  . Neuromuscular disorder     neuropathy  . Fibromyalgia   . Hepatitis A yrs ago  . PONV (postoperative nausea and vomiting)   . HIV (human immunodeficiency virus infection)   . CLL (chronic lymphoblastic leukemia) dx 2010    Followed at [facility] q24mo, no current therapy     Medications:  Prescriptions prior to admission  Medication Sig Dispense Refill  . aspirin EC 81 MG tablet Take 81 mg by mouth daily.      [name] atorvastatin (LIPITOR) 10 MG tablet Take 1 tablet (10 mg total) by mouth at bedtime.  30 tablet  6  . B Complex-C (B-COMPLEX WITH VITAMIN C) tablet Take 1 tablet by mouth daily.      . Calcium Carb-Cholecalciferol (CALCIUM 1000 + D PO) Take 1 tablet by mouth daily.      . Darunavir Ethanolate (PREZISTA) 800 MG tablet Take 1 tablet (800 mg total) by mouth daily.  30 tablet  5  . escitalopram (LEXAPRO) 10  MG tablet Take 1 tablet (10 mg total) by mouth daily.  90 tablet  2  . gabapentin (NEURONTIN) 300 MG capsule Take 1 capsule (300 mg total) by mouth 4 (four) times daily.  120 capsule  5  . ISENTRESS 400 MG tablet TAKE 1 TABLET BY MOUTH TWICE A DAY  60 tablet  5  . lamiVUDine-zidovudine (COMBIVIR) 150-300 MG per tablet Take 1 tablet by mouth 2 (two) times daily.  60 tablet  5  . loratadine (CLARITIN) 10 MG tablet Take 1 tablet (10 mg total) by mouth daily.  30 tablet  11  . metFORMIN (GLUCOPHAGE XR) 500 MG 24 hr tablet Take 1 tablet (500 mg total) by mouth daily with breakfast.  90 tablet  3  . metoprolol succinate (TOPROL-XL) 25 MG 24 hr tablet Take 25 mg by mouth at bedtime.      . mometasone (NASONEX) 50 MCG/ACT nasal spray Place 2 sprays into the nose daily.  17 g  12  . pantoprazole (PROTONIX) 40 MG tablet Take 40 mg by mouth daily.      . ritonavir (NORVIR) 100 MG TABS tablet TAKE 1 TABLET BY MOUTH EVERY DAY  30 tablet  5  . saccharomyces boulardii (FLORASTOR) 250 MG capsule Take 1 capsule (250 mg total) by  mouth 2 (two) times daily.  60 capsule  3  . valACYclovir (VALTREX) 500 MG tablet Take 1 tablet (500 mg total) by mouth daily.  30 tablet  3  . warfarin (COUMADIN) 10 MG tablet Take 5-10 mg by mouth See admin instructions. Takes 1/2 tab (5 mg) on Wednesday takes 1 tablet (10 mg) all other days      . clonazePAM (KLONOPIN) 0.5 MG tablet Take one daily as needed for anxiety  60 tablet  2  . EPINEPHrine (EPI-PEN) 0.3 mg/0.3 mL SOAJ Inject 0.3 mg into the muscle once. As needed for allergic reaction      . inFLIXimab (REMICADE) 100 MG injection Inject into the vein every 6 (six) weeks.       . polyethylene glycol powder (GLYCOLAX/MIRALAX) powder One scoop daily as needed for constipation.  255 g  3  . tadalafil (CIALIS) 20 MG tablet Take 1 tablet (20 mg total) by mouth daily as needed for erectile dysfunction.  10 tablet  0    Assessment: 63 yo M on chronic warfarin for hx recurrent DVTs.  INR is therapeutic on admission. Home dose listed above.  No bleeding noted.  INR is slightly above goal range today.  Is due lower dose per home regimen today anyway.  He is also admitted with PNA and on Zithromax which can increase INR.   Goal of Therapy:  INR 2-3   Plan:  Coumadin 2.5mg  po x1 today Daily INR  Biagio Borg 11/03/2013,8:28 AM

## 2013-11-03 NOTE — Progress Notes (Signed)
PROGRESS NOTE  [name] VEL:[id number] DOB: 28-Sep-1950 DOA: 11/02/2013 PCP: [name], MD  Summary: 63 year old man presented with recent onset of left-sided chest pain, pleuritic, worse with deep breath, intermittent, subjective fever and chills at home. Initial evaluation suggested pneumonia.  Assessment/Plan: 1. CAP but improving. No hypoxia or tachypnea. 2. HIV. Well controlled according to last office visit with infectious disease. 3. History of DVT, maintained on warfarin   Improving rapidly. Continue empiric antibiotics. Likely over the next 48 hours.  Warfarin per pharmacy.  Code Status: full code DVT prophylaxis: warfarin Family Communication: none present Disposition Plan: home   Murray Hodgkins, MD  [facility]  Pager [phone] If 7PM-7AM, please contact night-coverage at [email or web address], password [facility] 11/03/2013, 2:43 PM  LOS: 1 day   Consultants:    Procedures:    Antibiotics:  Zithromax 6/24 >>  Ceftriaxone 6/24 >>  HPI/Subjective: Feeling better today. Breathing better. Still feels tired.  Objective: Filed Vitals:   11/02/13 1632 11/02/13 1821 11/02/13 2151 11/03/13 0653  BP: 132/73 97/63 103/63 108/66  Pulse: 79 97 87 79  Temp: 98.7 F (37.1 C) 99.4 F (37.4 C) 99.4 F (37.4 C) 98.1 F (36.7 C)  TempSrc: Oral Oral Oral Oral  Resp: 20 20 16 20   Height: 5\' 9"  (1.753 m)     Weight: 82.736 kg (182 lb 6.4 oz)     SpO2: 98% 94% 100% 94%    Intake/Output Summary (Last 24 hours) at 11/03/13 1443 Last data filed at 11/03/13 1300  Gross per 24 hour  Intake    830 ml  Output   1100 ml  Net   -270 ml     Filed Weights   11/02/13 1125 11/02/13 1632  Weight: 81.647 kg (180 lb) 82.736 kg (182 lb 6.4 oz)    Exam:   Afebrile, vital signs are stable. No hypoxia. Gen. Appears calm and comfortable. Appears better today. Psych. Alert. Grossly normal mentation. Cardiovascular. Regular rate and rhythm. No murmur, rub or  gallop. No lower extremity edema. Respiratory. Clear to auscultation bilaterally. No wheezes, rales or rhonchi. Normal respiratory effort.  Data Reviewed:  Sodium 824, basic metabolic panel otherwise unremarkable.  Troponin is negative.  Hemoglobin 11.2, platelet count 129.  INR 3.24.  Blood cultures no growth to date  Scheduled Meds: . aspirin EC  81 mg Oral Daily  . atorvastatin  10 mg Oral QHS  . azithromycin  500 mg Intravenous Q24H  . B-complex with vitamin C  1 tablet Oral Daily  . cefTRIAXone (ROCEPHIN)  IV  1 g Intravenous Q24H  . Darunavir Ethanolate  800 mg Oral Q breakfast  . escitalopram  10 mg Oral Daily  . fluticasone  1 spray Each Nare Daily  . gabapentin  300 mg Oral QID  . insulin aspart  0-9 Units Subcutaneous TID WC  . lamiVUDine-zidovudine  1 tablet Oral BID  . loratadine  10 mg Oral Daily  . metoprolol succinate  25 mg Oral QHS  . pantoprazole  40 mg Oral Daily  . raltegravir  400 mg Oral BID  . ritonavir  100 mg Oral Q breakfast  . saccharomyces boulardii  250 mg Oral BID  . valACYclovir  500 mg Oral Daily  . warfarin  2.5 mg Oral Once  . Warfarin - Pharmacist Dosing Inpatient   Does not apply Q24H   Continuous Infusions:   Principal Problem:   CAP (community acquired pneumonia) Active Problems:   CAD, NATIVE VESSEL   Hypertension  DVT, lower extremity, recurrent   Chronic back pain   GERD (gastroesophageal reflux disease)   HIV (human immunodeficiency virus infection)   Hyponatremia   Time spent 20 minutes

## 2013-11-03 NOTE — Care Management Note (Addendum)
    Page 1 of 1   11/04/2013     1:41:24 PM CARE MANAGEMENT NOTE 11/04/2013  Patient:  [name], [name]   Account Number:  [id number]  Date Initiated:  11/03/2013  Documentation initiated by:  Theophilus Kinds  Subjective/Objective Assessment:   Pt admitted from home with pneumonia. Pt lives alone and has a daughter who is very active the in the care of the pt and has a neighbor that is very helpful. Pt will return home at discharge. Pt has a cane for home use.     Action/Plan:   No CM needs noted.   Anticipated DC Date:  11/04/2013   Anticipated DC Plan:  [place]  CM consult      Choice offered to / List presented to:             Status of service:  Completed, signed off Medicare Important Message given?  NA - LOS <3 / Initial given by admissions (If response is "NO", the following Medicare IM given date fields will be blank) Date Medicare IM given:   Date Additional Medicare IM given:    Discharge Disposition:  HOME/SELF CARE  Per UR Regulation:    If discussed at Long Length of Stay Meetings, dates discussed:    Comments:  11/04/13 [place], RN BSN CM Pt discharged home today. No CM needs noted.  11/03/13 [place], RN BSN CM

## 2013-11-04 ENCOUNTER — Telehealth: Payer: Self-pay | Admitting: General Practice

## 2013-11-04 DIAGNOSIS — J189 Pneumonia, unspecified organism: Secondary | ICD-10-CM | POA: Diagnosis not present

## 2013-11-04 DIAGNOSIS — Z7901 Long term (current) use of anticoagulants: Secondary | ICD-10-CM

## 2013-11-04 LAB — PROTIME-INR
INR: 2.63 — ABNORMAL HIGH (ref 0.00–1.49)
Prothrombin Time: 28.1 seconds — ABNORMAL HIGH (ref 11.6–15.2)

## 2013-11-04 LAB — LEGIONELLA ANTIGEN, URINE: Legionella Antigen, Urine: NEGATIVE

## 2013-11-04 LAB — GLUCOSE, CAPILLARY
Glucose-Capillary: 137 mg/dL — ABNORMAL HIGH (ref 70–99)
Glucose-Capillary: 80 mg/dL (ref 70–99)

## 2013-11-04 MED ORDER — CEFUROXIME AXETIL 250 MG PO TABS: 500.0000 mg | ORAL_TABLET | Freq: Two times a day (BID) | ORAL | Status: DC

## 2013-11-04 MED ORDER — AZITHROMYCIN 500 MG PO TABS: 500.0000 mg | ORAL_TABLET | Freq: Every day | ORAL | Status: DC

## 2013-11-04 MED ORDER — AZITHROMYCIN 250 MG PO TABS
500.0000 mg | ORAL_TABLET | Freq: Every day | ORAL | Status: DC
  Administered 2013-11-04: 500 mg via ORAL
  Filled 2013-11-04: qty 2

## 2013-11-04 MED ORDER — WARFARIN SODIUM 5 MG PO TABS: 5.0000 mg | ORAL_TABLET | Freq: Once | ORAL | Status: DC

## 2013-11-04 MED ORDER — CEFUROXIME AXETIL 500 MG PO TABS: 500.0000 mg | ORAL_TABLET | Freq: Two times a day (BID) | ORAL | Status: DC

## 2013-11-04 NOTE — Progress Notes (Signed)
Pt discharged via wheelchair.  Pt given prescriptions x 2 and educated about administration.  Pt verbalized understanding and educated about upcoming appointment for follow-up.  Pt IV removed without complications.

## 2013-11-04 NOTE — Progress Notes (Signed)
Pt does not wish to take insulin at he controls his diabetes with diet.  He states it takes too long to recover from the insulin when he goes back home.

## 2013-11-04 NOTE — Discharge Summary (Addendum)
Physician Discharge Summary  [name] HER:[id number] DOB: [birth year] DOA: 11/02/2013  PCP: Christopher Grant, MD Infectious Disease: Dr. Linus [name]  Admit date: 11/02/2013 Discharge date: 11/04/2013  Recommendations for Outpatient Follow-up:  1. Resolution of community-acquired pneumonia 2. Followup PT/INR while on antibiotics. I discussed with his Coumadin provider [name]; she will contact patient today and likely advise lower warfarin dosing with repeat INR in 3 days.   Follow-up Information   Follow up with Christopher Grant, MD. Schedule an appointment as soon as possible for a visit in 1 week.   Specialty:  Internal Medicine   Contact information:   [address] [place] [address] [phone]      Discharge Diagnoses:  1. Community acquired pneumonia next 2. HIV 3. History of DVT, maintained on warfarin  Discharge Condition: Improved Disposition: Home  Diet recommendation: Regular  Filed Weights   11/02/13 1125 11/02/13 1632  Weight: 81.647 kg (180 lb) 82.736 kg (182 lb 6.4 oz)    History of present illness:  63 year old man presented with recent onset of left-sided chest pain, pleuritic, worse with deep breath, intermittent, subjective fever and chills at home. Initial evaluation suggested pneumonia.  Hospital Course:  [name] was admitted for treatment of community acquired pneumonia. He rapidly improved with standard therapy and is now stable for discharge. Hospitalization was uncomplicated.  1. CAP. Much improved. No hypoxia or tachypnea. 2. HIV. Well controlled according to last office visit with infectious disease. 3. History of DVT, maintained on warfarin. INR therapeutic. As above, patient has discussed his hospitalization, laboratory values, current antibiotics with Coumadin staff at his clinic and they have advised continuing current dosage.  Consultants: none Procedures: none Antibiotics:  Zithromax 6/24 >> 7/3    Ceftriaxone 6/24 >> 6/25  Ceftin 6/26 >> 7/3  Discharge Instructions  Discharge Instructions   Diet general    Complete by:  As directed      Discharge instructions    Complete by:  As directed   Call physician or seek immediate medical assistance for increased pain, shortness of breath or worsening of condition     Increase activity slowly    Complete by:  As directed             Medication List         aspirin EC 81 MG tablet  Take 81 mg by mouth daily.     atorvastatin 10 MG tablet  Commonly known as:  LIPITOR  Take 1 tablet (10 mg total) by mouth at bedtime.     azithromycin 500 MG tablet  Commonly known as:  ZITHROMAX  Take 1 tablet (500 mg total) by mouth daily.     B-complex with vitamin C tablet  Take 1 tablet by mouth daily.     CALCIUM 1000 + D PO  Take 1 tablet by mouth daily.     cefUROXime 500 MG tablet  Commonly known as:  CEFTIN  Take 1 tablet (500 mg total) by mouth 2 (two) times daily with a meal.     clonazePAM 0.5 MG tablet  Commonly known as:  KLONOPIN  Take one daily as needed for anxiety     Darunavir Ethanolate 800 MG tablet  Commonly known as:  PREZISTA  Take 1 tablet (800 mg total) by mouth daily.     EPINEPHrine 0.3 mg/0.3 mL Soaj injection  Commonly known as:  EPI-PEN  Inject 0.3 mg into the muscle once. As needed for  allergic reaction     escitalopram 10 MG tablet  Commonly known as:  LEXAPRO  Take 1 tablet (10 mg total) by mouth daily.     gabapentin 300 MG capsule  Commonly known as:  NEURONTIN  Take 1 capsule (300 mg total) by mouth 4 (four) times daily.     ISENTRESS 400 MG tablet  Generic drug:  raltegravir  TAKE 1 TABLET BY MOUTH TWICE A DAY     lamiVUDine-zidovudine 150-300 MG per tablet  Commonly known as:  COMBIVIR  Take 1 tablet by mouth 2 (two) times daily.     loratadine 10 MG tablet  Commonly known as:  CLARITIN  Take 1 tablet (10 mg total) by mouth daily.     metFORMIN 500 MG 24 hr tablet  Commonly  known as:  GLUCOPHAGE XR  Take 1 tablet (500 mg total) by mouth daily with breakfast.     metoprolol succinate 25 MG 24 hr tablet  Commonly known as:  TOPROL-XL  Take 25 mg by mouth at bedtime.     mometasone 50 MCG/ACT nasal spray  Commonly known as:  NASONEX  Place 2 sprays into the nose daily.     pantoprazole 40 MG tablet  Commonly known as:  PROTONIX  Take 40 mg by mouth daily.     polyethylene glycol powder powder  Commonly known as:  GLYCOLAX/MIRALAX  One scoop daily as needed for constipation.     REMICADE 100 MG injection  Generic drug:  inFLIXimab  Inject into the vein every 6 (six) weeks.     ritonavir 100 MG Tabs tablet  Commonly known as:  NORVIR  TAKE 1 TABLET BY MOUTH EVERY DAY     saccharomyces boulardii 250 MG capsule  Commonly known as:  FLORASTOR  Take 1 capsule (250 mg total) by mouth 2 (two) times daily.     tadalafil 20 MG tablet  Commonly known as:  CIALIS  Take 1 tablet (20 mg total) by mouth daily as needed for erectile dysfunction.     valACYclovir 500 MG tablet  Commonly known as:  VALTREX  Take 1 tablet (500 mg total) by mouth daily.     warfarin 10 MG tablet  Commonly known as:  COUMADIN  Take 5-10 mg by mouth See admin instructions. Takes 1/2 tab (5 mg) on Wednesday takes 1 tablet (10 mg) all other days       Allergies  Allergen Reactions  . Other Anaphylaxis and Hives    Pecan  . Peanut-Containing Drug Products Anaphylaxis and Hives    Swelling of throat  . Morphine     REACTION: severe headache  . Oxycodone-Acetaminophen     REACTION: headache  . Penicillins     REACTION: red, flushed  . Promethazine Hcl     REACTION: makes him feel drunk    The results of significant diagnostics from this hospitalization (including imaging, microbiology, ancillary and laboratory) are listed below for reference.    Significant Diagnostic Studies: Dg Chest 2 View  11/02/2013   CLINICAL DATA:  Tired, chest pain, history HIV, rheumatoid  arthritis, GERD, coronary artery disease post MI, hypertension, CLL  EXAM: CHEST  2 VIEW  COMPARISON:  10/05/2012; correlation interval CT chest 05/19/2013  FINDINGS: Normal heart size post CABG.  Mediastinal contours and pulmonary vascularity normal.  Atelectasis versus developing consolidation in LEFT lower lobe.  Small hiatal hernia suspected.  Upper lungs clear.  No pleural effusion or pneumothorax.  Endplate spur formation thoracic spine.  IMPRESSION: Atelectasis versus consolidation LEFT lower lobe, new.  Suspect small hiatal hernia.   Electronically Signed   By: Lavonia Dana M.D.   On: 11/02/2013 12:34    Microbiology: Recent Results (from the past 240 hour(s))  CULTURE, BLOOD (ROUTINE X 2)     Status: None   Collection Time    11/02/13  2:29 PM      Result Value Ref Range Status   Specimen Description BLOOD RIGHT ANTECUBITAL   Final   Special Requests     Final   Value: BOTTLES DRAWN AEROBIC AND ANAEROBIC AEB=12CC ANA=8CC  IMMUNE:COMPROMISED   Culture NO GROWTH 2 DAYS   Final   Report Status PENDING   Incomplete  CULTURE, BLOOD (ROUTINE X 2)     Status: None   Collection Time    11/02/13  2:40 PM      Result Value Ref Range Status   Specimen Description BLOOD RIGHT FOREARM   Final   Special Requests     Final   Value: BOTTLES DRAWN AEROBIC AND ANAEROBIC 10CC  IMMUNE:COMPROMISED   Culture NO GROWTH 2 DAYS   Final   Report Status PENDING   Incomplete     Labs: Basic Metabolic Panel:  Recent Labs Lab 11/02/13 1135 11/03/13 0516  NA 131* 130*  K 3.8 3.9  CL 94* 95*  CO2 26 27  GLUCOSE 118* 101*  BUN 18 14  CREATININE 0.79 0.75  CALCIUM 8.7 7.6*   CBC:  Recent Labs Lab 11/02/13 1135 11/03/13 0516  WBC 10.5 8.3  HGB 13.6 11.2*  HCT 38.4* 32.1*  MCV 115.3* 115.1*  PLT 159 129*   Cardiac Enzymes:  Recent Labs Lab 11/02/13 1135 11/02/13 1735 11/02/13 2325  TROPONINI <0.30 <0.30 <0.30    Recent Labs  11/02/13 1135  PROBNP 1157.0*   CBG:  Recent  Labs Lab 11/02/13 2100 11/03/13 0733 11/03/13 1209 11/04/13 0728 11/04/13 1111  GLUCAP 110* 97 97 137* 80    Principal Problem:   CAP (community acquired pneumonia) Active Problems:   CAD, NATIVE VESSEL   Hypertension   DVT, lower extremity, recurrent   Chronic back pain   GERD (gastroesophageal reflux disease)   HIV (human immunodeficiency virus infection)   Hyponatremia   Time coordinating discharge: 25 minutes  Signed:  Murray Hodgkins, MD [facility] 11/04/2013, 1:35 PM

## 2013-11-04 NOTE — Progress Notes (Signed)
[facility] for Coumadin Indication: VTE prophylaxis  Allergies  Allergen Reactions  . Other Anaphylaxis and Hives    Pecan  . Peanut-Containing Drug Products Anaphylaxis and Hives    Swelling of throat  . Morphine     REACTION: severe headache  . Oxycodone-Acetaminophen     REACTION: headache  . Penicillins     REACTION: red, flushed  . Promethazine Hcl     REACTION: makes him feel drunk    Patient Measurements: Height: 5\' 9"  (175.3 cm) Weight: 182 lb 6.4 oz (82.736 kg) IBW/kg (Calculated) : 70.7  Vital Signs: Temp: 98.8 F (37.1 C) (06/26 0516) Temp src: Oral (06/26 0516) BP: 117/74 mmHg (06/26 0516) Pulse Rate: 81 (06/26 0516)  Labs:  Recent Labs  11/02/13 1135 11/02/13 1735 11/02/13 2325 11/03/13 0516 11/04/13 0521  HGB 13.6  --   --  11.2*  --   HCT 38.4*  --   --  32.1*  --   PLT 159  --   --  129*  --   LABPROT 24.2*  --   --  33.1* 28.1*  INR 2.17*  --   --  3.24* 2.63*  CREATININE 0.79  --   --  0.75  --   TROPONINI <0.30 <0.30 <0.30  --   --     Estimated Creatinine Clearance: 92.1 ml/min (by C-G formula based on Cr of 0.75).   Medical History: Past Medical History  Diagnosis Date  . Osteoarthritis, knee     s/p B TKA  . HIV infection dx 1993  . TIA (transient ischemic attack) 1997    mild residual L mouth droop  . H/O hiatal hernia 2008    surgery  . Gynecomastia, male   . Impotence of organic origin   . Rheumatoid arthritis(714.0) dx 2010    MTX, follows with rheum  . Gout   . Chronic back pain     follows with Nsurg  . Coronary artery disease 2010    s/p CABG '10, sees Dr. Percival Spanish  . Seasonal allergies   . Diverticulosis   . Gallstones   . Status post dilation of esophageal narrowing   . GERD (gastroesophageal reflux disease)   . Diverticulosis   . Esophagitis   . Hemorrhoids   . Tubular adenoma of colon   . Myocardial infarction 2010     x 2  . Hypertension   . DVT, lower extremity,  recurrent 2008, 2009    LLE, chronic anticoag since 2009  . Carotid artery occlusion     40-60% right ICA stenosis (09/2008)  . Neuromuscular disorder     neuropathy  . Fibromyalgia   . Hepatitis A yrs ago  . PONV (postoperative nausea and vomiting)   . HIV (human immunodeficiency virus infection)   . CLL (chronic lymphoblastic leukemia) dx 2010    Followed at [facility] q69mo, no current therapy     Medications:  Prescriptions prior to admission  Medication Sig Dispense Refill  . aspirin EC 81 MG tablet Take 81 mg by mouth daily.      [name] atorvastatin (LIPITOR) 10 MG tablet Take 1 tablet (10 mg total) by mouth at bedtime.  30 tablet  6  . B Complex-C (B-COMPLEX WITH VITAMIN C) tablet Take 1 tablet by mouth daily.      . Calcium Carb-Cholecalciferol (CALCIUM 1000 + D PO) Take 1 tablet by mouth daily.      . Darunavir Ethanolate (PREZISTA) 800 MG tablet  Take 1 tablet (800 mg total) by mouth daily.  30 tablet  5  . escitalopram (LEXAPRO) 10 MG tablet Take 1 tablet (10 mg total) by mouth daily.  90 tablet  2  . gabapentin (NEURONTIN) 300 MG capsule Take 1 capsule (300 mg total) by mouth 4 (four) times daily.  120 capsule  5  . ISENTRESS 400 MG tablet TAKE 1 TABLET BY MOUTH TWICE A DAY  60 tablet  5  . lamiVUDine-zidovudine (COMBIVIR) 150-300 MG per tablet Take 1 tablet by mouth 2 (two) times daily.  60 tablet  5  . loratadine (CLARITIN) 10 MG tablet Take 1 tablet (10 mg total) by mouth daily.  30 tablet  11  . metFORMIN (GLUCOPHAGE XR) 500 MG 24 hr tablet Take 1 tablet (500 mg total) by mouth daily with breakfast.  90 tablet  3  . metoprolol succinate (TOPROL-XL) 25 MG 24 hr tablet Take 25 mg by mouth at bedtime.      . mometasone (NASONEX) 50 MCG/ACT nasal spray Place 2 sprays into the nose daily.  17 g  12  . pantoprazole (PROTONIX) 40 MG tablet Take 40 mg by mouth daily.      . ritonavir (NORVIR) 100 MG TABS tablet TAKE 1 TABLET BY MOUTH EVERY DAY  30 tablet  5  . saccharomyces boulardii  (FLORASTOR) 250 MG capsule Take 1 capsule (250 mg total) by mouth 2 (two) times daily.  60 capsule  3  . valACYclovir (VALTREX) 500 MG tablet Take 1 tablet (500 mg total) by mouth daily.  30 tablet  3  . warfarin (COUMADIN) 10 MG tablet Take 5-10 mg by mouth See admin instructions. Takes 1/2 tab (5 mg) on Wednesday takes 1 tablet (10 mg) all other days      . clonazePAM (KLONOPIN) 0.5 MG tablet Take one daily as needed for anxiety  60 tablet  2  . EPINEPHrine (EPI-PEN) 0.3 mg/0.3 mL SOAJ Inject 0.3 mg into the muscle once. As needed for allergic reaction      . inFLIXimab (REMICADE) 100 MG injection Inject into the vein every 6 (six) weeks.       . polyethylene glycol powder (GLYCOLAX/MIRALAX) powder One scoop daily as needed for constipation.  255 g  3  . tadalafil (CIALIS) 20 MG tablet Take 1 tablet (20 mg total) by mouth daily as needed for erectile dysfunction.  10 tablet  0    Assessment: 63 yo M on chronic warfarin for hx recurrent DVTs.  INR is therapeutic on admission. Home dose listed above.  No bleeding noted.  INR at goal after lower dose given.  He is also admitted with PNA and on Zithromax which can increase INR.   Goal of Therapy:  INR 2-3   Plan:  Coumadin 5mg  po x1 today (lower dose while on Zithromax) Daily INR Change Zithromax to PO per P&T policy (see below)  Malaika Arnall, Lavonia Drafts 11/04/2013,9:11 AM  CONCERNING: Antibiotic IV to Oral Route Change Policy  RECOMMENDATION: This patient is receiving Zithromax by the intravenous route.  Based on criteria approved by the Pharmacy and Therapeutics Committee, the antibiotic(s) is/are being converted to the equivalent oral dose form(s).   DESCRIPTION: These criteria include:  Patient being treated for a respiratory tract infection, urinary tract infection, cellulitis or clostridium difficile associated diarrhea if on metronidazole  The patient is not neutropenic and does not exhibit a GI malabsorption state  The  patient is eating (either orally or via tube) and/or has been  taking other orally administered medications for a least 24 hours  The patient is improving clinically and has a Tmax < 100.5  If you have questions about this conversion, please contact the Pharmacy Department  [x]   [phone] )  [facility] []   [phone] )  [facility]  []   [phone] )  [facility] []   [phone] )  [facility]

## 2013-11-04 NOTE — Progress Notes (Signed)
PROGRESS NOTE  [name] ZGY:[id number] DOB: [birth year] DOA: 11/02/2013 PCP: [name], MD Infectious Disease: Dr. Linus Salmons  Summary: 63 year old man presented with recent onset of left-sided chest pain, pleuritic, worse with deep breath, intermittent, subjective fever and chills at home. Initial evaluation suggested pneumonia.   Assessment/Plan: 1. CAP. Much improved. No hypoxia or tachypnea. 2. HIV. Well controlled according to last office visit with infectious disease. 3. History of DVT, maintained on warfarin. INR therapeutic.   Home today, finish antibiotics  Warfarin per pharmacy.  Code Status: full code DVT prophylaxis: warfarin Family Communication: none present Disposition Plan: home   Murray Hodgkins, MD  [facility]  Pager [phone] If 7PM-7AM, please contact night-coverage at [email or web address], password [facility] 11/04/2013, 11:29 AM  LOS: 2 days   Consultants:    Procedures:    Antibiotics:  Zithromax 6/24 >> 7/3  Ceftriaxone 6/24 >> 6/25  Ceftin 6/26 >> 7/3  HPI/Subjective: Feels better. Would like to go home.  Objective: Filed Vitals:   11/03/13 1600 11/03/13 2052 11/04/13 0123 11/04/13 0516  BP: 123/69 92/53 115/73 117/74  Pulse: 78 83 83 81  Temp: 99.8 F (37.7 C) 98.3 F (36.8 C) 99.3 F (37.4 C) 98.8 F (37.1 C)  TempSrc:  Oral Oral Oral  Resp: 20 20 20 20   Height:      Weight:      SpO2: 95% 92% 93% 93%    Intake/Output Summary (Last 24 hours) at 11/04/13 1129 Last data filed at 11/04/13 0833  Gross per 24 hour  Intake   1190 ml  Output   1225 ml  Net    -35 ml     Filed Weights   11/02/13 1125 11/02/13 1632  Weight: 81.647 kg (180 lb) 82.736 kg (182 lb 6.4 oz)    Exam:  Afebrile, vital signs are stable. No hypoxia or tachypnea. Gen. Appears calm and comfortable. Appears better today. Psych. Alert. Grossly normal mentation. Speech fluent and clear. Cardiovascular. Regular rate and rhythm. No murmur, rub or  gallop. Respiratory. Clear to auscultation bilaterally. No wheezes, rales or rhonchi. Normal respiratory  Data Reviewed: Chemistry: Capillary blood sugars stable. Heme: INR therapeutic 2.63. ID: Blood cultures no growth to date  Scheduled Meds: . aspirin EC  81 mg Oral Daily  . atorvastatin  10 mg Oral QHS  . azithromycin  500 mg Oral Daily  . B-complex with vitamin C  1 tablet Oral Daily  . cefTRIAXone (ROCEPHIN)  IV  1 g Intravenous Q24H  . Darunavir Ethanolate  800 mg Oral Q breakfast  . escitalopram  10 mg Oral Daily  . fluticasone  1 spray Each Nare Daily  . gabapentin  300 mg Oral QID  . insulin aspart  0-9 Units Subcutaneous TID WC  . lamiVUDine-zidovudine  1 tablet Oral BID  . loratadine  10 mg Oral Daily  . metoprolol succinate  25 mg Oral QHS  . pantoprazole  40 mg Oral Daily  . raltegravir  400 mg Oral BID  . ritonavir  100 mg Oral Q breakfast  . saccharomyces boulardii  250 mg Oral BID  . valACYclovir  500 mg Oral Daily  . warfarin  5 mg Oral Once  . Warfarin - Pharmacist Dosing Inpatient   Does not apply Q24H   Continuous Infusions:   Principal Problem:   CAP (community acquired pneumonia) Active Problems:   CAD, NATIVE VESSEL   Hypertension   DVT, lower extremity, recurrent   Chronic back pain   GERD (gastroesophageal reflux  disease)   HIV (human immunodeficiency virus infection)   Hyponatremia

## 2013-11-04 NOTE — Telephone Encounter (Signed)
Patient dc'd from hospital today.  Instructed patient to take 5 mg of coumadin Saturday and Sunday and to check INR on Monday 6/29.  Patient verbalized understanding.

## 2013-11-07 LAB — CULTURE, BLOOD (ROUTINE X 2)
Culture: NO GROWTH
Culture: NO GROWTH

## 2013-11-07 LAB — GLUCOSE, CAPILLARY: Glucose-Capillary: 99 mg/dL (ref 70–99)

## 2013-11-08 ENCOUNTER — Ambulatory Visit (INDEPENDENT_AMBULATORY_CARE_PROVIDER_SITE_OTHER): Payer: Medicare Other | Admitting: General Practice

## 2013-11-08 DIAGNOSIS — Z5181 Encounter for therapeutic drug level monitoring: Secondary | ICD-10-CM

## 2013-11-08 LAB — POCT INR: INR: 1.5

## 2013-11-08 NOTE — Progress Notes (Signed)
Pre visit review using our clinic review tool, if applicable. No additional management support is needed unless otherwise documented below in the visit note. 

## 2013-11-09 DIAGNOSIS — M545 Low back pain, unspecified: Secondary | ICD-10-CM | POA: Diagnosis not present

## 2013-11-09 DIAGNOSIS — M6281 Muscle weakness (generalized): Secondary | ICD-10-CM | POA: Diagnosis not present

## 2013-11-09 DIAGNOSIS — R269 Unspecified abnormalities of gait and mobility: Secondary | ICD-10-CM | POA: Diagnosis not present

## 2013-11-09 DIAGNOSIS — IMO0001 Reserved for inherently not codable concepts without codable children: Secondary | ICD-10-CM | POA: Diagnosis not present

## 2013-11-10 ENCOUNTER — Ambulatory Visit (INDEPENDENT_AMBULATORY_CARE_PROVIDER_SITE_OTHER): Payer: Medicare Other | Admitting: Internal Medicine

## 2013-11-10 ENCOUNTER — Encounter: Payer: Self-pay | Admitting: Internal Medicine

## 2013-11-10 ENCOUNTER — Ambulatory Visit: Payer: Self-pay | Admitting: Internal Medicine

## 2013-11-10 VITALS — BP 114/76 | HR 80 | Temp 98.7°F | Wt 182.2 lb

## 2013-11-10 DIAGNOSIS — R7309 Other abnormal glucose: Secondary | ICD-10-CM

## 2013-11-10 DIAGNOSIS — M76899 Other specified enthesopathies of unspecified lower limb, excluding foot: Secondary | ICD-10-CM | POA: Diagnosis not present

## 2013-11-10 DIAGNOSIS — F32A Depression, unspecified: Secondary | ICD-10-CM

## 2013-11-10 DIAGNOSIS — F3289 Other specified depressive episodes: Secondary | ICD-10-CM

## 2013-11-10 DIAGNOSIS — F329 Major depressive disorder, single episode, unspecified: Secondary | ICD-10-CM

## 2013-11-10 DIAGNOSIS — J189 Pneumonia, unspecified organism: Secondary | ICD-10-CM | POA: Diagnosis not present

## 2013-11-10 DIAGNOSIS — G4700 Insomnia, unspecified: Secondary | ICD-10-CM

## 2013-11-10 DIAGNOSIS — I10 Essential (primary) hypertension: Secondary | ICD-10-CM | POA: Diagnosis not present

## 2013-11-10 DIAGNOSIS — I2510 Atherosclerotic heart disease of native coronary artery without angina pectoris: Secondary | ICD-10-CM | POA: Diagnosis not present

## 2013-11-10 MED ORDER — ZOLPIDEM TARTRATE 5 MG PO TABS: 5.0000 mg | ORAL_TABLET | Freq: Every evening | ORAL | Status: DC | PRN

## 2013-11-10 NOTE — Progress Notes (Signed)
Subjective:    Patient ID: [name], male    DOB: 04-Dec-1950, 63 y.o.   MRN: [id number]  HPI  Here to f/u recent hopsn for CAP in the setting of HIV, hx of DVT, CAD, CLL, depression.  Pt denies chest pain, increased sob or doe, wheezing, orthopnea, PND, increased LE swelling, palpitations, dizziness or syncope.  No fever, worsening cough.  Stamina improved, finishing last antibx today.   Pt denies polydipsia, polyuria,.  Denies worsening depressive symptoms, suicidal ideation, or panic; does have worsening sleep difficulty in past 2 wks, keeps waking up only 2 hrs after onset sleep every night Past Medical History  Diagnosis Date  . Osteoarthritis, knee     s/p B TKA  . HIV infection dx 1993  . TIA (transient ischemic attack) 1997    mild residual L mouth droop  . H/O hiatal hernia 2008    surgery  . Gynecomastia, male   . Impotence of organic origin   . Rheumatoid arthritis(714.0) dx 2010    MTX, follows with rheum  . Gout   . Chronic back pain     follows with Nsurg  . Coronary artery disease 2010    s/p CABG '10, sees Dr. Percival Spanish  . Seasonal allergies   . Diverticulosis   . Gallstones   . Status post dilation of esophageal narrowing   . GERD (gastroesophageal reflux disease)   . Diverticulosis   . Esophagitis   . Hemorrhoids   . Tubular adenoma of colon   . Myocardial infarction 2010     x 2  . Hypertension   . DVT, lower extremity, recurrent 2008, 2009    LLE, chronic anticoag since 2009  . Carotid artery occlusion     40-60% right ICA stenosis (09/2008)  . Neuromuscular disorder     neuropathy  . Fibromyalgia   . Hepatitis A yrs ago  . PONV (postoperative nausea and vomiting)   . HIV (human immunodeficiency virus infection)   . CLL (chronic lymphoblastic leukemia) dx 2010    Followed at [facility]65[facility], no current therapy    Past Surgical History  Procedure Laterality Date  . Spine surgery  2010    "rod and screws", "failed", lopwer spine,   .  Cholecystectomy    . Hiatal hernia repair      wrap  . Shoulder surgery Left   . Mandible surgery Bilateral     tmj  . Varicose vein      stripping  . Replacement total knee Bilateral   . Knee arthroplasty  07/22/2011    Procedure: COMPUTER ASSISTED TOTAL KNEE ARTHROPLASTY;  Surgeon: Meredith Pel, MD;  Location: [place];  Service: Orthopedics;  Laterality: Right;  Right total knee arthroplasty  . Coronary artery bypass graft  2010    triple bypass  . Hardware removal N/A 07/02/2012    Procedure: HARDWARE REMOVAL;  Surgeon: Elaina Hoops, MD;  Location: [facility];  Service: Neurosurgery;  Laterality: N/A;  . Inguinal hernia repair Bilateral   . Umbilical hernia repair      x 1  . Rotator cuff repair Right   . Esophagogastroduodenoscopy (egd) with propofol N/A 12/28/2012    Procedure: ESOPHAGOGASTRODUODENOSCOPY (EGD) WITH PROPOFOL;  Surgeon: Jerene Bears, MD;  Location: [facility];  Service: Gastroenterology;  Laterality: N/A;  . Colonoscopy with propofol N/A 12/28/2012    Procedure: COLONOSCOPY WITH PROPOFOL;  Surgeon: Jerene Bears, MD;  Location: [facility];  Service: Gastroenterology;  Laterality:  N/A;  . Joint replacement Left 1999  . Tonsillectomy    . Ring around testicle hernia reapir  184 and 1986    x 2  . Esophagogastroduodenoscopy (egd) with propofol N/A 03/15/2013    Procedure: ESOPHAGOGASTRODUODENOSCOPY (EGD) WITH PROPOFOL;  Surgeon: Jerene Bears, MD;  Location: [facility];  Service: Gastroenterology;  Laterality: N/A;    reports that he has never smoked. He has never used smokeless tobacco. He reports that he drinks alcohol. He reports that he does not use illicit drugs. family history includes Alcohol abuse in his daughter; Breast cancer in his mother; Colon polyps in his father; Crohn's disease in his paternal aunt; Diabetes in his brother, maternal grandmother, and mother; Drug abuse in his daughter; Heart disease in his brother; Hyperlipidemia in his brother,  father, and mother; Hypertension in his mother; Prostate cancer in his father. There is no history of Colon cancer. Allergies  Allergen Reactions  . Other Anaphylaxis and Hives    Pecan  . Peanut-Containing Drug Products Anaphylaxis and Hives    Swelling of throat  . Morphine     REACTION: severe headache  . Oxycodone-Acetaminophen     REACTION: headache  . Penicillins     REACTION: red, flushed  . Promethazine Hcl     REACTION: makes him feel drunk   Current Outpatient Prescriptions on File Prior to Visit  Medication Sig Dispense Refill  . aspirin EC 81 MG tablet Take 81 mg by mouth daily.      [name] atorvastatin (LIPITOR) 10 MG tablet Take 1 tablet (10 mg total) by mouth at bedtime.  30 tablet  6  . azithromycin (ZITHROMAX) 500 MG tablet Take 1 tablet (500 mg total) by mouth daily.  7 tablet  0  . B Complex-C (B-COMPLEX WITH VITAMIN C) tablet Take 1 tablet by mouth daily.      . Calcium Carb-Cholecalciferol (CALCIUM 1000 + D PO) Take 1 tablet by mouth daily.      . cefUROXime (CEFTIN) 500 MG tablet Take 1 tablet (500 mg total) by mouth 2 (two) times daily with a meal.  15 tablet  0  . clonazePAM (KLONOPIN) 0.5 MG tablet Take one daily as needed for anxiety  60 tablet  2  . Darunavir Ethanolate (PREZISTA) 800 MG tablet Take 1 tablet (800 mg total) by mouth daily.  30 tablet  5  . EPINEPHrine (EPI-PEN) 0.3 mg/0.3 mL SOAJ Inject 0.3 mg into the muscle once. As needed for allergic reaction      . escitalopram (LEXAPRO) 10 MG tablet Take 1 tablet (10 mg total) by mouth daily.  90 tablet  2  . gabapentin (NEURONTIN) 300 MG capsule Take 1 capsule (300 mg total) by mouth 4 (four) times daily.  120 capsule  5  . inFLIXimab (REMICADE) 100 MG injection Inject into the vein every 6 (six) weeks.       . ISENTRESS 400 MG tablet TAKE 1 TABLET BY MOUTH TWICE A DAY  60 tablet  5  . lamiVUDine-zidovudine (COMBIVIR) 150-300 MG per tablet Take 1 tablet by mouth 2 (two) times daily.  60 tablet  5  .  loratadine (CLARITIN) 10 MG tablet Take 1 tablet (10 mg total) by mouth daily.  30 tablet  11  . metFORMIN (GLUCOPHAGE XR) 500 MG 24 hr tablet Take 1 tablet (500 mg total) by mouth daily with breakfast.  90 tablet  3  . metoprolol succinate (TOPROL-XL) 25 MG 24 hr tablet Take 25 mg by mouth  at bedtime.      . mometasone (NASONEX) 50 MCG/ACT nasal spray Place 2 sprays into the nose daily.  17 g  12  . pantoprazole (PROTONIX) 40 MG tablet Take 40 mg by mouth daily.      . polyethylene glycol powder (GLYCOLAX/MIRALAX) powder One scoop daily as needed for constipation.  255 g  3  . ritonavir (NORVIR) 100 MG TABS tablet TAKE 1 TABLET BY MOUTH EVERY DAY  30 tablet  5  . saccharomyces boulardii (FLORASTOR) 250 MG capsule Take 1 capsule (250 mg total) by mouth 2 (two) times daily.  60 capsule  3  . tadalafil (CIALIS) 20 MG tablet Take 1 tablet (20 mg total) by mouth daily as needed for erectile dysfunction.  10 tablet  0  . valACYclovir (VALTREX) 500 MG tablet Take 1 tablet (500 mg total) by mouth daily.  30 tablet  3  . warfarin (COUMADIN) 10 MG tablet Take 5-10 mg by mouth See admin instructions. Takes 1/2 tab (5 mg) on Wednesday takes 1 tablet (10 mg) all other days       No current facility-administered medications on file prior to visit.   Review of Systems  Constitutional: Negative for unusual diaphoresis or other sweats  HENT: Negative for ringing in ear Eyes: Negative for double vision or worsening visual disturbance.  Respiratory: Negative for choking and stridor.   Gastrointestinal: Negative for vomiting or other signifcant bowel change Genitourinary: Negative for hematuria or decreased urine volume.  Musculoskeletal: Negative for other MSK pain or swelling Skin: Negative for color change and worsening wound.  Neurological: Negative for tremors and numbness other than noted  Psychiatric/Behavioral: Negative for decreased concentration or agitation other than above       Objective:    Physical Exam BP 114/76  Pulse 80  Temp(Src) 98.7 F (37.1 C) (Oral)  Wt 182 lb 4 oz (82.668 kg)  SpO2 96% VS noted, not ill appearing Constitutional: Pt appears well-developed, well-nourished.  HENT: Head: NCAT.  Right Ear: External ear normal.  Left Ear: External ear normal.  Eyes: . Pupils are equal, round, and reactive to light. Conjunctivae and EOM are normal Neck: Normal range of motion. Neck supple.  Cardiovascular: Normal rate and regular rhythm.   Pulmonary/Chest: Effort normal and breath sounds without rales or wheezing  Neurological: Pt is alert. Not confused , motor grossly intact Skin: Skin is warm. No rash Psychiatric: Pt behavior is normal. No agitation. not depressed affect    Assessment & Plan:

## 2013-11-10 NOTE — Patient Instructions (Signed)
Please take all new medication as prescribed - the ambien  Please continue all other medications as before, and refills have been done if requested.  Please have the pharmacy call with any other refills you may need.  Please continue your efforts at being more active, low cholesterol diet, and weight control.  Please keep your appointments with your specialists as you may have planned

## 2013-11-10 NOTE — Progress Notes (Signed)
Pre visit review using our clinic review tool, if applicable. No additional management support is needed unless otherwise documented below in the visit note. 

## 2013-11-11 DIAGNOSIS — I82409 Acute embolism and thrombosis of unspecified deep veins of unspecified lower extremity: Secondary | ICD-10-CM | POA: Diagnosis not present

## 2013-11-11 DIAGNOSIS — Z7901 Long term (current) use of anticoagulants: Secondary | ICD-10-CM | POA: Diagnosis not present

## 2013-11-11 DIAGNOSIS — I749 Embolism and thrombosis of unspecified artery: Secondary | ICD-10-CM | POA: Diagnosis not present

## 2013-11-11 DIAGNOSIS — G4700 Insomnia, unspecified: Secondary | ICD-10-CM | POA: Insufficient documentation

## 2013-11-11 LAB — POCT INR: INR: 1.9

## 2013-11-11 NOTE — Assessment & Plan Note (Signed)
stable overall by history and exam, and pt to continue medical treatment as before,  to f/u any worsening symptoms or concerns 

## 2013-11-11 NOTE — Assessment & Plan Note (Signed)
stable overall by history and exam, recent data reviewed with pt, and pt to continue medical treatment as before,  to f/u any worsening symptoms or concerns Lab Results  Component Value Date   WBC 8.3 11/03/2013   HGB 11.2* 11/03/2013   HCT 32.1* 11/03/2013   PLT 129* 11/03/2013   GLUCOSE 101* 11/03/2013   CHOL 135 07/08/2013   TRIG 141.0 07/08/2013   HDL 39.10 07/08/2013   LDLCALC 68 07/08/2013   ALT 22 10/13/2013   AST 26 10/13/2013   NA 130* 11/03/2013   K 3.9 11/03/2013   CL 95* 11/03/2013   CREATININE 0.75 11/03/2013   BUN 14 11/03/2013   CO2 27 11/03/2013   TSH 1.25 07/08/2013   INR 1.5 11/08/2013   HGBA1C 5.1 11/02/2013

## 2013-11-11 NOTE — Assessment & Plan Note (Signed)
stable overall by history and exam, recent data reviewed with pt, and pt to continue medical treatment as before,  to f/u any worsening symptoms or concerns BP Readings from Last 3 Encounters:  11/10/13 114/76  11/04/13 117/74  10/27/13 126/70

## 2013-11-11 NOTE — Assessment & Plan Note (Signed)
Clinically resolved, d/w pt, to finish antibx, hold on other such as cbc or f/u cxr for now,  to f/u any worsening symptoms or concerns

## 2013-11-11 NOTE — Assessment & Plan Note (Signed)
Ok for limited ambien prn,  to f/u any worsening symptoms or concerns

## 2013-11-14 DIAGNOSIS — R269 Unspecified abnormalities of gait and mobility: Secondary | ICD-10-CM | POA: Diagnosis not present

## 2013-11-14 DIAGNOSIS — IMO0001 Reserved for inherently not codable concepts without codable children: Secondary | ICD-10-CM | POA: Diagnosis not present

## 2013-11-14 DIAGNOSIS — M6281 Muscle weakness (generalized): Secondary | ICD-10-CM | POA: Diagnosis not present

## 2013-11-14 DIAGNOSIS — M545 Low back pain, unspecified: Secondary | ICD-10-CM | POA: Diagnosis not present

## 2013-11-15 ENCOUNTER — Ambulatory Visit (INDEPENDENT_AMBULATORY_CARE_PROVIDER_SITE_OTHER): Payer: Medicare Other | Admitting: General Practice

## 2013-11-15 DIAGNOSIS — Z5181 Encounter for therapeutic drug level monitoring: Secondary | ICD-10-CM

## 2013-11-15 NOTE — Progress Notes (Signed)
Pre visit review using our clinic review tool, if applicable. No additional management support is needed unless otherwise documented below in the visit note. 

## 2013-11-17 ENCOUNTER — Ambulatory Visit (INDEPENDENT_AMBULATORY_CARE_PROVIDER_SITE_OTHER): Payer: Medicare Other | Admitting: General Practice

## 2013-11-17 DIAGNOSIS — Z5181 Encounter for therapeutic drug level monitoring: Secondary | ICD-10-CM

## 2013-11-17 LAB — POCT INR: INR: 2.7

## 2013-11-17 NOTE — Progress Notes (Signed)
Pre visit review using our clinic review tool, if applicable. No additional management support is needed unless otherwise documented below in the visit note. 

## 2013-11-18 DIAGNOSIS — M6281 Muscle weakness (generalized): Secondary | ICD-10-CM | POA: Diagnosis not present

## 2013-11-18 DIAGNOSIS — M545 Low back pain, unspecified: Secondary | ICD-10-CM | POA: Diagnosis not present

## 2013-11-18 DIAGNOSIS — IMO0001 Reserved for inherently not codable concepts without codable children: Secondary | ICD-10-CM | POA: Diagnosis not present

## 2013-11-18 DIAGNOSIS — M1610 Unilateral primary osteoarthritis, unspecified hip: Secondary | ICD-10-CM | POA: Diagnosis not present

## 2013-11-18 DIAGNOSIS — M169 Osteoarthritis of hip, unspecified: Secondary | ICD-10-CM | POA: Diagnosis not present

## 2013-11-18 DIAGNOSIS — R269 Unspecified abnormalities of gait and mobility: Secondary | ICD-10-CM | POA: Diagnosis not present

## 2013-11-21 DIAGNOSIS — M545 Low back pain, unspecified: Secondary | ICD-10-CM | POA: Diagnosis not present

## 2013-11-21 DIAGNOSIS — R269 Unspecified abnormalities of gait and mobility: Secondary | ICD-10-CM | POA: Diagnosis not present

## 2013-11-21 DIAGNOSIS — M6281 Muscle weakness (generalized): Secondary | ICD-10-CM | POA: Diagnosis not present

## 2013-11-21 DIAGNOSIS — IMO0001 Reserved for inherently not codable concepts without codable children: Secondary | ICD-10-CM | POA: Diagnosis not present

## 2013-11-23 DIAGNOSIS — IMO0001 Reserved for inherently not codable concepts without codable children: Secondary | ICD-10-CM | POA: Diagnosis not present

## 2013-11-23 DIAGNOSIS — R269 Unspecified abnormalities of gait and mobility: Secondary | ICD-10-CM | POA: Diagnosis not present

## 2013-11-23 DIAGNOSIS — M9981 Other biomechanical lesions of cervical region: Secondary | ICD-10-CM | POA: Diagnosis not present

## 2013-11-23 DIAGNOSIS — M545 Low back pain, unspecified: Secondary | ICD-10-CM | POA: Diagnosis not present

## 2013-11-23 DIAGNOSIS — M999 Biomechanical lesion, unspecified: Secondary | ICD-10-CM | POA: Diagnosis not present

## 2013-11-23 DIAGNOSIS — M5030 Other cervical disc degeneration, unspecified cervical region: Secondary | ICD-10-CM | POA: Diagnosis not present

## 2013-11-23 DIAGNOSIS — M6281 Muscle weakness (generalized): Secondary | ICD-10-CM | POA: Diagnosis not present

## 2013-11-24 ENCOUNTER — Ambulatory Visit (HOSPITAL_BASED_OUTPATIENT_CLINIC_OR_DEPARTMENT_OTHER): Payer: Medicare Other | Admitting: Oncology

## 2013-11-24 ENCOUNTER — Other Ambulatory Visit (HOSPITAL_BASED_OUTPATIENT_CLINIC_OR_DEPARTMENT_OTHER): Payer: Medicare Other

## 2013-11-24 ENCOUNTER — Encounter: Payer: Self-pay | Admitting: Oncology

## 2013-11-24 ENCOUNTER — Telehealth: Payer: Self-pay | Admitting: Oncology

## 2013-11-24 VITALS — BP 131/77 | HR 67 | Temp 97.2°F | Resp 18 | Ht 69.0 in | Wt 176.3 lb

## 2013-11-24 DIAGNOSIS — D7589 Other specified diseases of blood and blood-forming organs: Secondary | ICD-10-CM

## 2013-11-24 DIAGNOSIS — C9111 Chronic lymphocytic leukemia of B-cell type in remission: Secondary | ICD-10-CM

## 2013-11-24 DIAGNOSIS — B20 Human immunodeficiency virus [HIV] disease: Secondary | ICD-10-CM

## 2013-11-24 DIAGNOSIS — C9110 Chronic lymphocytic leukemia of B-cell type not having achieved remission: Secondary | ICD-10-CM

## 2013-11-24 DIAGNOSIS — M25559 Pain in unspecified hip: Secondary | ICD-10-CM | POA: Diagnosis not present

## 2013-11-24 DIAGNOSIS — R599 Enlarged lymph nodes, unspecified: Secondary | ICD-10-CM

## 2013-11-24 DIAGNOSIS — M069 Rheumatoid arthritis, unspecified: Secondary | ICD-10-CM | POA: Diagnosis not present

## 2013-11-24 LAB — CBC WITH DIFFERENTIAL/PLATELET
BASO%: 0.9 % (ref 0.0–2.0)
Basophils Absolute: 0.1 10*3/uL (ref 0.0–0.1)
EOS%: 3.3 % (ref 0.0–7.0)
Eosinophils Absolute: 0.3 10*3/uL (ref 0.0–0.5)
HCT: 41.7 % (ref 38.4–49.9)
HGB: 14 g/dL (ref 13.0–17.1)
LYMPH%: 63.4 % — ABNORMAL HIGH (ref 14.0–49.0)
MCH: 40.5 pg — ABNORMAL HIGH (ref 27.2–33.4)
MCHC: 33.7 g/dL (ref 32.0–36.0)
MCV: 120.2 fL — ABNORMAL HIGH (ref 79.3–98.0)
MONO#: 1 10*3/uL — ABNORMAL HIGH (ref 0.1–0.9)
MONO%: 10.8 % (ref 0.0–14.0)
NEUT#: 1.9 10*3/uL (ref 1.5–6.5)
NEUT%: 21.6 % — ABNORMAL LOW (ref 39.0–75.0)
Platelets: 181 10*3/uL (ref 140–400)
RBC: 3.47 10*6/uL — ABNORMAL LOW (ref 4.20–5.82)
RDW: 13.7 % (ref 11.0–14.6)
WBC: 9 10*3/uL (ref 4.0–10.3)
lymph#: 5.7 10*3/uL — ABNORMAL HIGH (ref 0.9–3.3)

## 2013-11-24 LAB — COMPREHENSIVE METABOLIC PANEL (CC13)
ALT: 26 U/L (ref 0–55)
AST: 36 U/L — ABNORMAL HIGH (ref 5–34)
Albumin: 3.8 g/dL (ref 3.5–5.0)
Alkaline Phosphatase: 77 U/L (ref 40–150)
Anion Gap: 9 mEq/L (ref 3–11)
BUN: 15.6 mg/dL (ref 7.0–26.0)
CO2: 27 mEq/L (ref 22–29)
Calcium: 8.9 mg/dL (ref 8.4–10.4)
Chloride: 101 mEq/L (ref 98–109)
Creatinine: 0.8 mg/dL (ref 0.7–1.3)
Glucose: 85 mg/dl (ref 70–140)
Potassium: 4.5 mEq/L (ref 3.5–5.1)
Sodium: 136 mEq/L (ref 136–145)
Total Bilirubin: 0.73 mg/dL (ref 0.20–1.20)
Total Protein: 6.3 g/dL — ABNORMAL LOW (ref 6.4–8.3)

## 2013-11-24 LAB — TECHNOLOGIST REVIEW

## 2013-11-24 NOTE — Progress Notes (Signed)
Hematology and Oncology Follow Up Visit  [name] [id number] Aug 02, 1950 62 y.o. 11/24/2013 10:02 AM [name], MDLeschber, Christopher Rodney, MD   Principle Diagnosis: 63 year old gentleman with CLL diagnosed in 2007 at [facility].  Current therapy: Observation and surveillance.  Interim History:  [name] presents today for a followup visit.  Since his last visit, he he was hospitalized for pneumonia in June of 2015 but currently have recovered. He has not reported any symptoms and continue to be completely asymptomatic from his CLL. He has not reported any fevers or chills or sweats. Has not reported any cough or hemoptysis or hematemesis. Has not reported any weight loss her early satiety. He does not report any headaches or blurry vision or double vision. Does not report any syncope or seizures. Report any chest pain or shortness of breath. Does not report any cough or hemoptysis. His appetite and weight have been stable. Has not reported any change in his bowel habits such as constipation or diarrhea. He is not reporting any urinary symptoms. Rest of his review of systems unremarkable.  Medications: I have reviewed the patient's current medications.  Current Outpatient Prescriptions  Medication Sig Dispense Refill  . aspirin EC 81 MG tablet Take 81 mg by mouth daily.      [name] atorvastatin (LIPITOR) 10 MG tablet Take 1 tablet (10 mg total) by mouth at bedtime.  30 tablet  6  . azithromycin (ZITHROMAX) 500 MG tablet Take 1 tablet (500 mg total) by mouth daily.  7 tablet  0  . B Complex-C (B-COMPLEX WITH VITAMIN C) tablet Take 1 tablet by mouth daily.      . Calcium Carb-Cholecalciferol (CALCIUM 1000 + D PO) Take 1 tablet by mouth daily.      . cefUROXime (CEFTIN) 500 MG tablet Take 1 tablet (500 mg total) by mouth 2 (two) times daily with a meal.  15 tablet  0  . clonazePAM (KLONOPIN) 0.5 MG tablet Take one daily as needed for anxiety  60 tablet  2  . Darunavir  Ethanolate (PREZISTA) 800 MG tablet Take 1 tablet (800 mg total) by mouth daily.  30 tablet  5  . EPINEPHrine (EPI-PEN) 0.3 mg/0.3 mL SOAJ Inject 0.3 mg into the muscle once. As needed for allergic reaction      . escitalopram (LEXAPRO) 10 MG tablet Take 1 tablet (10 mg total) by mouth daily.  90 tablet  2  . gabapentin (NEURONTIN) 300 MG capsule Take 1 capsule (300 mg total) by mouth 4 (four) times daily.  120 capsule  5  . inFLIXimab (REMICADE) 100 MG injection Inject into the vein every 6 (six) weeks.       . ISENTRESS 400 MG tablet TAKE 1 TABLET BY MOUTH TWICE A DAY  60 tablet  5  . lamiVUDine-zidovudine (COMBIVIR) 150-300 MG per tablet Take 1 tablet by mouth 2 (two) times daily.  60 tablet  5  . loratadine (CLARITIN) 10 MG tablet Take 1 tablet (10 mg total) by mouth daily.  30 tablet  11  . metFORMIN (GLUCOPHAGE XR) 500 MG 24 hr tablet Take 1 tablet (500 mg total) by mouth daily with breakfast.  90 tablet  3  . metoprolol succinate (TOPROL-XL) 25 MG 24 hr tablet Take 25 mg by mouth at bedtime.      . mometasone (NASONEX) 50 MCG/ACT nasal spray Place 2 sprays into the nose daily.  17 g  12  . pantoprazole (PROTONIX) 40 MG tablet Take 40  mg by mouth daily.      . polyethylene glycol powder (GLYCOLAX/MIRALAX) powder One scoop daily as needed for constipation.  255 g  3  . ritonavir (NORVIR) 100 MG TABS tablet TAKE 1 TABLET BY MOUTH EVERY DAY  30 tablet  5  . saccharomyces boulardii (FLORASTOR) 250 MG capsule Take 1 capsule (250 mg total) by mouth 2 (two) times daily.  60 capsule  3  . tadalafil (CIALIS) 20 MG tablet Take 1 tablet (20 mg total) by mouth daily as needed for erectile dysfunction.  10 tablet  0  . valACYclovir (VALTREX) 500 MG tablet Take 1 tablet (500 mg total) by mouth daily.  30 tablet  3  . warfarin (COUMADIN) 10 MG tablet Take 5-10 mg by mouth See admin instructions. Takes 1/2 tab (5 mg) on Wednesday takes 1 tablet (10 mg) all other days      . zolpidem (AMBIEN) 5 MG tablet Take  1 tablet (5 mg total) by mouth at bedtime as needed for sleep.  30 tablet  0   No current facility-administered medications for this visit.     Allergies:  Allergies  Allergen Reactions  . Other Anaphylaxis and Hives    Pecan  . Peanut-Containing Drug Products Anaphylaxis and Hives    Swelling of throat  . Morphine     REACTION: severe headache  . Oxycodone-Acetaminophen     REACTION: headache  . Penicillins     REACTION: red, flushed  . Promethazine Hcl     REACTION: makes him feel drunk    Past Medical History, Surgical history, Social history, and Family History were reviewed and updated.   Physical Exam: Blood pressure 131/77, pulse 67, temperature 97.2 F (36.2 C), temperature source Oral, resp. rate 18, height 5\' 9"  (1.753 m), weight 176 lb 5 oz (79.975 kg). ECOG: 1 General appearance: alert Head: Normocephalic, without obvious abnormality Neck: no adenopathy Lymph nodes: Cervical, supraclavicular, and axillary nodes normal. Heart:regular rate and rhythm, S1, S2 normal, no murmur, click, rub or gallop Lung:chest clear, no wheezing, rales, normal symmetric air entry Abdomin: soft, non-tender, without masses or organomegaly EXT:no erythema, induration, or nodules   Lab Results: Lab Results  Component Value Date   WBC 9.0 11/24/2013   HGB 14.0 11/24/2013   HCT 41.7 11/24/2013   MCV 120.2* 11/24/2013   PLT 181 11/24/2013     Chemistry      Component Value Date/Time   NA 130* 11/03/2013 0516   NA 142 05/19/2013 0916   K 3.9 11/03/2013 0516   K 4.3 05/19/2013 0916   CL 95* 11/03/2013 0516   CO2 27 11/03/2013 0516   CO2 28 05/19/2013 0916   BUN 14 11/03/2013 0516   BUN 20.3 05/19/2013 0916   CREATININE 0.75 11/03/2013 0516   CREATININE 0.78 10/13/2013 1120   CREATININE 0.9 05/19/2013 0916      Component Value Date/Time   CALCIUM 7.6* 11/03/2013 0516   CALCIUM 8.9 05/19/2013 0916   ALKPHOS 65 10/13/2013 1120   ALKPHOS 82 05/19/2013 0916   AST 26 10/13/2013 1120   AST 25 05/19/2013  0916   ALT 22 10/13/2013 1120   ALT 22 05/19/2013 0916   BILITOT 0.8 10/13/2013 1120   BILITOT 0.91 05/19/2013 0916       Impression and Plan:  63 year old gentleman with the following issues:  1. CLL diagnosed in 2007 presented with stage 0 CD38 positive, ZAP 70 positive. He did have some mild lymphadenopathy on CT scan that is  mostly subcentimeter with a aortocaval lymph node largest measuring 2.0 x 1.6 cm. the time being we'll continue active surveillance and repeat laboratory testing in 6 months. We will repeat imaging studies as needed.  2. Macrocytosis: As undoubtedly related to his HIV medications I will continue to observe that no intervention is needed.   [facility], MD 7/16/201510:02 AM

## 2013-11-24 NOTE — Telephone Encounter (Signed)
Pt confirmed labs/ov per 07/16 POF, gave pt AVS......KJ °

## 2013-11-28 DIAGNOSIS — M9981 Other biomechanical lesions of cervical region: Secondary | ICD-10-CM | POA: Diagnosis not present

## 2013-11-28 DIAGNOSIS — M545 Low back pain, unspecified: Secondary | ICD-10-CM | POA: Diagnosis not present

## 2013-11-28 DIAGNOSIS — M6281 Muscle weakness (generalized): Secondary | ICD-10-CM | POA: Diagnosis not present

## 2013-11-28 DIAGNOSIS — M5030 Other cervical disc degeneration, unspecified cervical region: Secondary | ICD-10-CM | POA: Diagnosis not present

## 2013-11-28 DIAGNOSIS — R269 Unspecified abnormalities of gait and mobility: Secondary | ICD-10-CM | POA: Diagnosis not present

## 2013-11-28 DIAGNOSIS — IMO0001 Reserved for inherently not codable concepts without codable children: Secondary | ICD-10-CM | POA: Diagnosis not present

## 2013-11-28 DIAGNOSIS — M999 Biomechanical lesion, unspecified: Secondary | ICD-10-CM | POA: Diagnosis not present

## 2013-11-29 ENCOUNTER — Ambulatory Visit: Payer: Medicare Other | Admitting: General Practice

## 2013-11-29 DIAGNOSIS — Z5181 Encounter for therapeutic drug level monitoring: Secondary | ICD-10-CM

## 2013-11-29 LAB — POCT INR: INR: 4.3

## 2013-11-29 NOTE — Progress Notes (Signed)
Pre visit review using our clinic review tool, if applicable. No additional management support is needed unless otherwise documented below in the visit note. 

## 2013-11-30 ENCOUNTER — Telehealth: Payer: Self-pay | Admitting: General Practice

## 2013-11-30 DIAGNOSIS — M5030 Other cervical disc degeneration, unspecified cervical region: Secondary | ICD-10-CM | POA: Diagnosis not present

## 2013-11-30 DIAGNOSIS — M9981 Other biomechanical lesions of cervical region: Secondary | ICD-10-CM | POA: Diagnosis not present

## 2013-11-30 DIAGNOSIS — M999 Biomechanical lesion, unspecified: Secondary | ICD-10-CM | POA: Diagnosis not present

## 2013-11-30 NOTE — Telephone Encounter (Signed)
INR of 4.7 reported on 7/21 has been addressed.

## 2013-11-30 NOTE — Telephone Encounter (Signed)
This has been addressed.

## 2013-12-01 DIAGNOSIS — M069 Rheumatoid arthritis, unspecified: Secondary | ICD-10-CM | POA: Diagnosis not present

## 2013-12-01 DIAGNOSIS — R29898 Other symptoms and signs involving the musculoskeletal system: Secondary | ICD-10-CM | POA: Diagnosis not present

## 2013-12-01 DIAGNOSIS — M25559 Pain in unspecified hip: Secondary | ICD-10-CM | POA: Diagnosis not present

## 2013-12-05 DIAGNOSIS — M5030 Other cervical disc degeneration, unspecified cervical region: Secondary | ICD-10-CM | POA: Diagnosis not present

## 2013-12-05 DIAGNOSIS — M9981 Other biomechanical lesions of cervical region: Secondary | ICD-10-CM | POA: Diagnosis not present

## 2013-12-05 DIAGNOSIS — M999 Biomechanical lesion, unspecified: Secondary | ICD-10-CM | POA: Diagnosis not present

## 2013-12-06 DIAGNOSIS — M545 Low back pain, unspecified: Secondary | ICD-10-CM | POA: Diagnosis not present

## 2013-12-06 DIAGNOSIS — IMO0001 Reserved for inherently not codable concepts without codable children: Secondary | ICD-10-CM | POA: Diagnosis not present

## 2013-12-06 DIAGNOSIS — R269 Unspecified abnormalities of gait and mobility: Secondary | ICD-10-CM | POA: Diagnosis not present

## 2013-12-06 DIAGNOSIS — M6281 Muscle weakness (generalized): Secondary | ICD-10-CM | POA: Diagnosis not present

## 2013-12-07 DIAGNOSIS — M9981 Other biomechanical lesions of cervical region: Secondary | ICD-10-CM | POA: Diagnosis not present

## 2013-12-07 DIAGNOSIS — M999 Biomechanical lesion, unspecified: Secondary | ICD-10-CM | POA: Diagnosis not present

## 2013-12-07 DIAGNOSIS — M5030 Other cervical disc degeneration, unspecified cervical region: Secondary | ICD-10-CM | POA: Diagnosis not present

## 2013-12-12 DIAGNOSIS — M9981 Other biomechanical lesions of cervical region: Secondary | ICD-10-CM | POA: Diagnosis not present

## 2013-12-12 DIAGNOSIS — M5030 Other cervical disc degeneration, unspecified cervical region: Secondary | ICD-10-CM | POA: Diagnosis not present

## 2013-12-12 DIAGNOSIS — M999 Biomechanical lesion, unspecified: Secondary | ICD-10-CM | POA: Diagnosis not present

## 2013-12-13 ENCOUNTER — Other Ambulatory Visit: Payer: Self-pay | Admitting: Internal Medicine

## 2013-12-13 DIAGNOSIS — M6281 Muscle weakness (generalized): Secondary | ICD-10-CM | POA: Diagnosis not present

## 2013-12-13 DIAGNOSIS — M545 Low back pain, unspecified: Secondary | ICD-10-CM | POA: Diagnosis not present

## 2013-12-13 DIAGNOSIS — IMO0001 Reserved for inherently not codable concepts without codable children: Secondary | ICD-10-CM | POA: Diagnosis not present

## 2013-12-13 DIAGNOSIS — R269 Unspecified abnormalities of gait and mobility: Secondary | ICD-10-CM | POA: Diagnosis not present

## 2013-12-15 DIAGNOSIS — M999 Biomechanical lesion, unspecified: Secondary | ICD-10-CM | POA: Diagnosis not present

## 2013-12-15 DIAGNOSIS — M9981 Other biomechanical lesions of cervical region: Secondary | ICD-10-CM | POA: Diagnosis not present

## 2013-12-15 DIAGNOSIS — M5030 Other cervical disc degeneration, unspecified cervical region: Secondary | ICD-10-CM | POA: Diagnosis not present

## 2013-12-16 ENCOUNTER — Ambulatory Visit (INDEPENDENT_AMBULATORY_CARE_PROVIDER_SITE_OTHER): Payer: Medicare Other | Admitting: Family Medicine

## 2013-12-16 DIAGNOSIS — IMO0001 Reserved for inherently not codable concepts without codable children: Secondary | ICD-10-CM | POA: Diagnosis not present

## 2013-12-16 DIAGNOSIS — Z5181 Encounter for therapeutic drug level monitoring: Secondary | ICD-10-CM

## 2013-12-16 DIAGNOSIS — Z7901 Long term (current) use of anticoagulants: Secondary | ICD-10-CM | POA: Diagnosis not present

## 2013-12-16 DIAGNOSIS — I82409 Acute embolism and thrombosis of unspecified deep veins of unspecified lower extremity: Secondary | ICD-10-CM | POA: Diagnosis not present

## 2013-12-16 DIAGNOSIS — M545 Low back pain, unspecified: Secondary | ICD-10-CM | POA: Diagnosis not present

## 2013-12-16 DIAGNOSIS — I749 Embolism and thrombosis of unspecified artery: Secondary | ICD-10-CM | POA: Diagnosis not present

## 2013-12-16 DIAGNOSIS — R269 Unspecified abnormalities of gait and mobility: Secondary | ICD-10-CM | POA: Diagnosis not present

## 2013-12-16 DIAGNOSIS — M6281 Muscle weakness (generalized): Secondary | ICD-10-CM | POA: Diagnosis not present

## 2013-12-16 LAB — POCT INR: INR: 1.7

## 2013-12-19 ENCOUNTER — Other Ambulatory Visit: Payer: Self-pay | Admitting: *Deleted

## 2013-12-19 DIAGNOSIS — M999 Biomechanical lesion, unspecified: Secondary | ICD-10-CM | POA: Diagnosis not present

## 2013-12-19 DIAGNOSIS — R269 Unspecified abnormalities of gait and mobility: Secondary | ICD-10-CM | POA: Diagnosis not present

## 2013-12-19 DIAGNOSIS — M9981 Other biomechanical lesions of cervical region: Secondary | ICD-10-CM | POA: Diagnosis not present

## 2013-12-19 DIAGNOSIS — IMO0001 Reserved for inherently not codable concepts without codable children: Secondary | ICD-10-CM | POA: Diagnosis not present

## 2013-12-19 DIAGNOSIS — M545 Low back pain, unspecified: Secondary | ICD-10-CM | POA: Diagnosis not present

## 2013-12-19 DIAGNOSIS — M5030 Other cervical disc degeneration, unspecified cervical region: Secondary | ICD-10-CM | POA: Diagnosis not present

## 2013-12-19 DIAGNOSIS — M6281 Muscle weakness (generalized): Secondary | ICD-10-CM | POA: Diagnosis not present

## 2013-12-19 DIAGNOSIS — B20 Human immunodeficiency virus [HIV] disease: Secondary | ICD-10-CM

## 2013-12-19 MED ORDER — LAMIVUDINE-ZIDOVUDINE 150-300 MG PO TABS: 1.0000 | ORAL_TABLET | Freq: Two times a day (BID) | ORAL | Status: DC

## 2013-12-19 MED ORDER — DARUNAVIR ETHANOLATE 800 MG PO TABS: 800.0000 mg | ORAL_TABLET | Freq: Every day | ORAL | Status: DC

## 2013-12-19 MED ORDER — RITONAVIR 100 MG PO TABS: ORAL_TABLET | ORAL | Status: DC

## 2013-12-19 MED ORDER — METOPROLOL SUCCINATE ER 25 MG PO TB24: 25.0000 mg | ORAL_TABLET | Freq: Every day | ORAL | Status: DC

## 2013-12-19 MED ORDER — RALTEGRAVIR POTASSIUM 400 MG PO TABS: ORAL_TABLET | ORAL | Status: DC

## 2013-12-21 DIAGNOSIS — M999 Biomechanical lesion, unspecified: Secondary | ICD-10-CM | POA: Diagnosis not present

## 2013-12-21 DIAGNOSIS — M5030 Other cervical disc degeneration, unspecified cervical region: Secondary | ICD-10-CM | POA: Diagnosis not present

## 2013-12-21 DIAGNOSIS — M9981 Other biomechanical lesions of cervical region: Secondary | ICD-10-CM | POA: Diagnosis not present

## 2013-12-22 DIAGNOSIS — IMO0001 Reserved for inherently not codable concepts without codable children: Secondary | ICD-10-CM | POA: Diagnosis not present

## 2013-12-22 DIAGNOSIS — R269 Unspecified abnormalities of gait and mobility: Secondary | ICD-10-CM | POA: Diagnosis not present

## 2013-12-22 DIAGNOSIS — M545 Low back pain, unspecified: Secondary | ICD-10-CM | POA: Diagnosis not present

## 2013-12-22 DIAGNOSIS — M6281 Muscle weakness (generalized): Secondary | ICD-10-CM | POA: Diagnosis not present

## 2013-12-26 DIAGNOSIS — M9981 Other biomechanical lesions of cervical region: Secondary | ICD-10-CM | POA: Diagnosis not present

## 2013-12-26 DIAGNOSIS — M5030 Other cervical disc degeneration, unspecified cervical region: Secondary | ICD-10-CM | POA: Diagnosis not present

## 2013-12-26 DIAGNOSIS — M999 Biomechanical lesion, unspecified: Secondary | ICD-10-CM | POA: Diagnosis not present

## 2013-12-27 DIAGNOSIS — R269 Unspecified abnormalities of gait and mobility: Secondary | ICD-10-CM | POA: Diagnosis not present

## 2013-12-27 DIAGNOSIS — M6281 Muscle weakness (generalized): Secondary | ICD-10-CM | POA: Diagnosis not present

## 2013-12-27 DIAGNOSIS — M545 Low back pain, unspecified: Secondary | ICD-10-CM | POA: Diagnosis not present

## 2013-12-27 DIAGNOSIS — IMO0001 Reserved for inherently not codable concepts without codable children: Secondary | ICD-10-CM | POA: Diagnosis not present

## 2013-12-28 DIAGNOSIS — M9981 Other biomechanical lesions of cervical region: Secondary | ICD-10-CM | POA: Diagnosis not present

## 2013-12-28 DIAGNOSIS — M999 Biomechanical lesion, unspecified: Secondary | ICD-10-CM | POA: Diagnosis not present

## 2013-12-28 DIAGNOSIS — M5030 Other cervical disc degeneration, unspecified cervical region: Secondary | ICD-10-CM | POA: Diagnosis not present

## 2013-12-30 DIAGNOSIS — M545 Low back pain, unspecified: Secondary | ICD-10-CM | POA: Diagnosis not present

## 2013-12-30 DIAGNOSIS — IMO0001 Reserved for inherently not codable concepts without codable children: Secondary | ICD-10-CM | POA: Diagnosis not present

## 2013-12-30 DIAGNOSIS — M6281 Muscle weakness (generalized): Secondary | ICD-10-CM | POA: Diagnosis not present

## 2013-12-30 DIAGNOSIS — R269 Unspecified abnormalities of gait and mobility: Secondary | ICD-10-CM | POA: Diagnosis not present

## 2014-01-02 DIAGNOSIS — M5030 Other cervical disc degeneration, unspecified cervical region: Secondary | ICD-10-CM | POA: Diagnosis not present

## 2014-01-02 DIAGNOSIS — M999 Biomechanical lesion, unspecified: Secondary | ICD-10-CM | POA: Diagnosis not present

## 2014-01-02 DIAGNOSIS — M9981 Other biomechanical lesions of cervical region: Secondary | ICD-10-CM | POA: Diagnosis not present

## 2014-01-03 ENCOUNTER — Ambulatory Visit (INDEPENDENT_AMBULATORY_CARE_PROVIDER_SITE_OTHER): Payer: Medicare Other | Admitting: *Deleted

## 2014-01-03 DIAGNOSIS — IMO0001 Reserved for inherently not codable concepts without codable children: Secondary | ICD-10-CM | POA: Diagnosis not present

## 2014-01-03 DIAGNOSIS — R269 Unspecified abnormalities of gait and mobility: Secondary | ICD-10-CM | POA: Diagnosis not present

## 2014-01-03 DIAGNOSIS — M545 Low back pain, unspecified: Secondary | ICD-10-CM | POA: Diagnosis not present

## 2014-01-03 DIAGNOSIS — Z5181 Encounter for therapeutic drug level monitoring: Secondary | ICD-10-CM

## 2014-01-03 DIAGNOSIS — M6281 Muscle weakness (generalized): Secondary | ICD-10-CM | POA: Diagnosis not present

## 2014-01-03 LAB — POCT INR: INR: 1.9

## 2014-01-04 DIAGNOSIS — M5030 Other cervical disc degeneration, unspecified cervical region: Secondary | ICD-10-CM | POA: Diagnosis not present

## 2014-01-04 DIAGNOSIS — M9981 Other biomechanical lesions of cervical region: Secondary | ICD-10-CM | POA: Diagnosis not present

## 2014-01-04 DIAGNOSIS — M999 Biomechanical lesion, unspecified: Secondary | ICD-10-CM | POA: Diagnosis not present

## 2014-01-05 ENCOUNTER — Ambulatory Visit (INDEPENDENT_AMBULATORY_CARE_PROVIDER_SITE_OTHER): Payer: Medicare Other | Admitting: Internal Medicine

## 2014-01-05 ENCOUNTER — Encounter: Payer: Self-pay | Admitting: Internal Medicine

## 2014-01-05 ENCOUNTER — Telehealth (INDEPENDENT_AMBULATORY_CARE_PROVIDER_SITE_OTHER): Payer: Medicare Other

## 2014-01-05 VITALS — BP 152/68 | HR 58 | Temp 98.0°F | Ht 69.0 in | Wt 180.0 lb

## 2014-01-05 DIAGNOSIS — R7309 Other abnormal glucose: Secondary | ICD-10-CM | POA: Diagnosis not present

## 2014-01-05 DIAGNOSIS — I2510 Atherosclerotic heart disease of native coronary artery without angina pectoris: Secondary | ICD-10-CM | POA: Diagnosis not present

## 2014-01-05 DIAGNOSIS — R269 Unspecified abnormalities of gait and mobility: Secondary | ICD-10-CM | POA: Diagnosis not present

## 2014-01-05 DIAGNOSIS — R531 Weakness: Secondary | ICD-10-CM

## 2014-01-05 DIAGNOSIS — E109 Type 1 diabetes mellitus without complications: Secondary | ICD-10-CM

## 2014-01-05 DIAGNOSIS — M545 Low back pain, unspecified: Secondary | ICD-10-CM | POA: Diagnosis not present

## 2014-01-05 DIAGNOSIS — F32A Depression, unspecified: Secondary | ICD-10-CM

## 2014-01-05 DIAGNOSIS — I10 Essential (primary) hypertension: Secondary | ICD-10-CM

## 2014-01-05 DIAGNOSIS — M6281 Muscle weakness (generalized): Secondary | ICD-10-CM

## 2014-01-05 DIAGNOSIS — F329 Major depressive disorder, single episode, unspecified: Secondary | ICD-10-CM

## 2014-01-05 DIAGNOSIS — B20 Human immunodeficiency virus [HIV] disease: Secondary | ICD-10-CM

## 2014-01-05 DIAGNOSIS — IMO0001 Reserved for inherently not codable concepts without codable children: Secondary | ICD-10-CM | POA: Diagnosis not present

## 2014-01-05 DIAGNOSIS — F3289 Other specified depressive episodes: Secondary | ICD-10-CM

## 2014-01-05 NOTE — Assessment & Plan Note (Signed)
prev metformin for variable insulin metabolism - high and low cbgs but off since 08/2011 as "not diabetic" per a1c <5 S/p endo eval fall 2013 Resumed metformin spring 2015 - reports stabilization of glycemic variability   Lab Results  Component Value Date   HGBA1C 5.1 11/02/2013

## 2014-01-05 NOTE — Progress Notes (Signed)
Subjective:    Patient ID: [name], male    DOB: 16-Apr-1951, 63 y.o.   MRN: [id number]  HPI  Patient here for follow up Reviewed chronic medical issues and interval medical events  Past Medical History  Diagnosis Date  . Osteoarthritis, knee     s/p B TKA  . HIV infection dx 1993  . TIA (transient ischemic attack) 1997    mild residual L mouth droop  . H/O hiatal hernia 2008    surgery  . Gynecomastia, male   . Impotence of organic origin   . Rheumatoid arthritis(714.0) dx 2010    MTX, follows with rheum  . Gout   . Chronic back pain     follows with Nsurg  . Coronary artery disease 2010    s/p CABG '10, sees Dr. Percival Spanish  . Seasonal allergies   . Diverticulosis   . Gallstones   . Status post dilation of esophageal narrowing   . GERD (gastroesophageal reflux disease)   . Diverticulosis   . Esophagitis   . Hemorrhoids   . Tubular adenoma of colon   . Myocardial infarction 2010     x 2  . Hypertension   . DVT, lower extremity, recurrent 2008, 2009    LLE, chronic anticoag since 2009  . Carotid artery occlusion     40-60% right ICA stenosis (09/2008)  . Neuromuscular disorder     neuropathy  . Fibromyalgia   . Hepatitis A yrs ago  . CLL (chronic lymphoblastic leukemia) dx 2010    [facility]ed at [facility]68[facility], no current therapy     Review of Systems  Constitutional: Negative for fatigue and unexpected weight change.  Respiratory: Negative for cough and shortness of breath.   Cardiovascular: Negative for leg swelling.  Musculoskeletal: Positive for gait problem (chronic but imprved with ongoing PT and chiropractic care). Negative for joint swelling.  Psychiatric/Behavioral: Negative for self-injury and dysphoric mood. The patient is not nervous/anxious.        Objective:   Physical Exam  BP 152/68  Pulse 58  Temp(Src) 98 F (36.7 C) (Oral)  Ht 5\' 9"  (1.753 m)  Wt 180 lb (81.647 kg)  BMI 26.57 kg/m2  SpO2 98% Wt Readings from Last 3 Encounters:    01/05/14 180 lb (81.647 kg)  11/24/13 176 lb 5 oz (79.975 kg)  11/10/13 182 lb 4 oz (82.668 kg)   Constitutional: he appears well-developed and well-nourished. No distress.  Neck: Normal range of motion. Neck supple. No JVD present. No thyromegaly present.  Cardiovascular: Normal rate, regular rhythm and normal heart sounds.  No murmur heard. No BLE edema. Pulmonary/Chest: Effort normal and breath sounds normal. No respiratory distress. he has no wheezes.  Psychiatric: he has a normal mood and affect. His behavior is normal. Judgment and thought content normal.   Lab Results  Component Value Date   WBC 9.0 11/24/2013   HGB 14.0 11/24/2013   HCT 41.7 11/24/2013   PLT 181 11/24/2013   GLUCOSE 85 11/24/2013   CHOL 135 07/08/2013   TRIG 141.0 07/08/2013   HDL 39.10 07/08/2013   LDLCALC 68 07/08/2013   ALT 26 11/24/2013   AST 36* 11/24/2013   NA 136 11/24/2013   K 4.5 11/24/2013   CL 95* 11/03/2013   CREATININE 0.8 11/24/2013   BUN 15.6 11/24/2013   CO2 27 11/24/2013   TSH 1.25 07/08/2013   INR 1.9 01/03/2014   HGBA1C 5.1 11/02/2013    Dg Chest 2 View  11/02/2013   CLINICAL DATA:  Tired, chest pain, history HIV, rheumatoid arthritis, GERD, coronary artery disease post MI, hypertension, CLL  EXAM: CHEST  2 VIEW  COMPARISON:  10/05/2012; correlation interval CT chest 05/19/2013  FINDINGS: Normal heart size post CABG.  Mediastinal contours and pulmonary vascularity normal.  Atelectasis versus developing consolidation in LEFT lower lobe.  Small hiatal hernia suspected.  Upper lungs clear.  No pleural effusion or pneumothorax.  Endplate spur formation thoracic spine.  IMPRESSION: Atelectasis versus consolidation LEFT lower lobe, new.  Suspect small hiatal hernia.   Electronically Signed   By: Lavonia Dana M.D.   On: 11/02/2013 12:34       Assessment & Plan:   Requests authorization to podiatrist Dr. Steffanie Rainwater for prescription shoes -will contact their office in [place] to resubmit form for [place] to sign  and return  Problem List Items Addressed This Visit   Depression     chronic symptoms - currently doing well previously on prozac, then sertraline, but none since 2007 started cymbalta summer 2014 because of depression symptoms and chronic pain syndrome symptoms improved, but increasing nocturnal panic attacks and ?GI side effects late 03/2013, so changed to lexapro and stopped cymbalta continue same - refill as needed    HIV disease     Dx 1993 - no secondary illness related to same Follows with ID - VL undetectable, CD4>800 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     Hypertension      BP Readings from Last 3 Encounters:  01/05/14 152/68  11/24/13 131/77  11/10/13 114/76   The current medical regimen is generally effective;  continue present plan and medications.     Other abnormal glucose - Primary      prev metformin for variable insulin metabolism - high and low cbgs but off since 08/2011 as "not diabetic" per a1c <5 S/p endo eval fall 2013 Resumed metformin spring 2015 - reports stabilization of glycemic variability   Lab Results  Component Value Date   HGBA1C 5.1 11/02/2013      RESOLVED: Rapidly progressive weakness     Improved/reversed since summer 2014 with ongoing care and PT/chiroppractice intervention Multifactorial co-morbid illnesses with chronic balance issues, R hand tremor, B hand/grip weakness, drooling and trouble swallowing Repeat neuro eval generally inconclusive (Drs Darrelyn Hillock, Adair Patter and [place])  Reports c-spine MRI done with Dr Walden Field in late 2013- "normal" per pt report MRI brain 07/2012 with diffuse global atrophy, no acute change or  specific diagnosis made      Time spent with pt today 30 minutes, greater than 50% time spent counseling patient on borderline diabetes, depression hx and medication review. Also review of prior records

## 2014-01-05 NOTE — Assessment & Plan Note (Signed)
chronic symptoms - currently doing well previously on prozac, then sertraline, but none since 2007 started cymbalta summer 2014 because of depression symptoms and chronic pain syndrome symptoms improved, but increasing nocturnal panic attacks and ?GI side effects late 03/2013, so changed to lexapro and stopped cymbalta continue same - refill as needed

## 2014-01-05 NOTE — Assessment & Plan Note (Signed)
BP Readings from Last 3 Encounters:  01/05/14 152/68  11/24/13 131/77  11/10/13 114/76   The current medical regimen is generally effective;  continue present plan and medications.

## 2014-01-05 NOTE — Assessment & Plan Note (Signed)
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

## 2014-01-05 NOTE — Telephone Encounter (Signed)
LVM for office to call us back.    RE: Need Dr. Drucilla Chalet office to refax shoe request form to [place].   FYI: office is in [place].

## 2014-01-05 NOTE — Assessment & Plan Note (Signed)
Improved/reversed since summer 2014 with ongoing care and PT/chiroppractice intervention Multifactorial co-morbid illnesses with chronic balance issues, R hand tremor, B hand/grip weakness, drooling and trouble swallowing Repeat neuro eval generally inconclusive (Drs Darrelyn Hillock, Adair Patter and [place])  Reports c-spine MRI done with Dr Walden Field in late 2013- "normal" per pt report MRI brain 07/2012 with diffuse global atrophy, no acute change or specific diagnosis made

## 2014-01-05 NOTE — Patient Instructions (Signed)
It was good to see you today.  We have reviewed your prior records including labs and tests today  Medications reviewed and updated, no changes recommended at this time.  We will work with Dr Irving Shows to authorize your shoes as dicussed  continue working with your other specialists as reviewed  Please schedule followup in 3-6 months, call sooner if problems.

## 2014-01-05 NOTE — Progress Notes (Signed)
Pre visit review using our clinic review tool, if applicable. No additional management support is needed unless otherwise documented below in the visit note. 

## 2014-01-06 DIAGNOSIS — M069 Rheumatoid arthritis, unspecified: Secondary | ICD-10-CM | POA: Diagnosis not present

## 2014-01-09 DIAGNOSIS — M5030 Other cervical disc degeneration, unspecified cervical region: Secondary | ICD-10-CM | POA: Diagnosis not present

## 2014-01-09 DIAGNOSIS — M9981 Other biomechanical lesions of cervical region: Secondary | ICD-10-CM | POA: Diagnosis not present

## 2014-01-09 DIAGNOSIS — IMO0001 Reserved for inherently not codable concepts without codable children: Secondary | ICD-10-CM | POA: Diagnosis not present

## 2014-01-09 DIAGNOSIS — R269 Unspecified abnormalities of gait and mobility: Secondary | ICD-10-CM | POA: Diagnosis not present

## 2014-01-09 DIAGNOSIS — M6281 Muscle weakness (generalized): Secondary | ICD-10-CM | POA: Diagnosis not present

## 2014-01-09 DIAGNOSIS — M545 Low back pain, unspecified: Secondary | ICD-10-CM | POA: Diagnosis not present

## 2014-01-09 DIAGNOSIS — M999 Biomechanical lesion, unspecified: Secondary | ICD-10-CM | POA: Diagnosis not present

## 2014-01-11 DIAGNOSIS — M9981 Other biomechanical lesions of cervical region: Secondary | ICD-10-CM | POA: Diagnosis not present

## 2014-01-11 DIAGNOSIS — M999 Biomechanical lesion, unspecified: Secondary | ICD-10-CM | POA: Diagnosis not present

## 2014-01-11 DIAGNOSIS — M5030 Other cervical disc degeneration, unspecified cervical region: Secondary | ICD-10-CM | POA: Diagnosis not present

## 2014-01-12 DIAGNOSIS — R269 Unspecified abnormalities of gait and mobility: Secondary | ICD-10-CM | POA: Diagnosis not present

## 2014-01-12 DIAGNOSIS — M545 Low back pain, unspecified: Secondary | ICD-10-CM | POA: Diagnosis not present

## 2014-01-12 DIAGNOSIS — IMO0001 Reserved for inherently not codable concepts without codable children: Secondary | ICD-10-CM | POA: Diagnosis not present

## 2014-01-12 DIAGNOSIS — M6281 Muscle weakness (generalized): Secondary | ICD-10-CM | POA: Diagnosis not present

## 2014-01-17 ENCOUNTER — Ambulatory Visit (INDEPENDENT_AMBULATORY_CARE_PROVIDER_SITE_OTHER): Payer: Medicare Other | Admitting: *Deleted

## 2014-01-17 ENCOUNTER — Other Ambulatory Visit: Payer: Self-pay | Admitting: Internal Medicine

## 2014-01-17 DIAGNOSIS — M6281 Muscle weakness (generalized): Secondary | ICD-10-CM | POA: Diagnosis not present

## 2014-01-17 DIAGNOSIS — M999 Biomechanical lesion, unspecified: Secondary | ICD-10-CM | POA: Diagnosis not present

## 2014-01-17 DIAGNOSIS — M5030 Other cervical disc degeneration, unspecified cervical region: Secondary | ICD-10-CM | POA: Diagnosis not present

## 2014-01-17 DIAGNOSIS — M545 Low back pain, unspecified: Secondary | ICD-10-CM | POA: Diagnosis not present

## 2014-01-17 DIAGNOSIS — R269 Unspecified abnormalities of gait and mobility: Secondary | ICD-10-CM | POA: Diagnosis not present

## 2014-01-17 DIAGNOSIS — M9981 Other biomechanical lesions of cervical region: Secondary | ICD-10-CM | POA: Diagnosis not present

## 2014-01-17 DIAGNOSIS — IMO0001 Reserved for inherently not codable concepts without codable children: Secondary | ICD-10-CM | POA: Diagnosis not present

## 2014-01-17 LAB — POCT INR: INR: 2.1

## 2014-01-17 NOTE — Telephone Encounter (Signed)
Have tried several times for the office to get the form for Diabetic Shoe order.   Sending a fax to the office to expedite request.   Also, added an order for diabetic shoes.

## 2014-01-19 DIAGNOSIS — M545 Low back pain, unspecified: Secondary | ICD-10-CM | POA: Diagnosis not present

## 2014-01-19 DIAGNOSIS — R269 Unspecified abnormalities of gait and mobility: Secondary | ICD-10-CM | POA: Diagnosis not present

## 2014-01-19 DIAGNOSIS — M6281 Muscle weakness (generalized): Secondary | ICD-10-CM | POA: Diagnosis not present

## 2014-01-19 DIAGNOSIS — IMO0001 Reserved for inherently not codable concepts without codable children: Secondary | ICD-10-CM | POA: Diagnosis not present

## 2014-01-23 DIAGNOSIS — M6281 Muscle weakness (generalized): Secondary | ICD-10-CM | POA: Diagnosis not present

## 2014-01-23 DIAGNOSIS — IMO0001 Reserved for inherently not codable concepts without codable children: Secondary | ICD-10-CM | POA: Diagnosis not present

## 2014-01-23 DIAGNOSIS — M545 Low back pain, unspecified: Secondary | ICD-10-CM | POA: Diagnosis not present

## 2014-01-23 DIAGNOSIS — R269 Unspecified abnormalities of gait and mobility: Secondary | ICD-10-CM | POA: Diagnosis not present

## 2014-01-23 DIAGNOSIS — M9981 Other biomechanical lesions of cervical region: Secondary | ICD-10-CM | POA: Diagnosis not present

## 2014-01-23 DIAGNOSIS — M999 Biomechanical lesion, unspecified: Secondary | ICD-10-CM | POA: Diagnosis not present

## 2014-01-23 DIAGNOSIS — M5030 Other cervical disc degeneration, unspecified cervical region: Secondary | ICD-10-CM | POA: Diagnosis not present

## 2014-01-25 NOTE — Telephone Encounter (Signed)
Called Dr. Irving Shows and received information about rx for shoes for pt.   Called pt to confirm where we needed to send the rx to. Pt is calling back with that information.

## 2014-01-26 DIAGNOSIS — M545 Low back pain, unspecified: Secondary | ICD-10-CM | POA: Diagnosis not present

## 2014-01-26 DIAGNOSIS — M6281 Muscle weakness (generalized): Secondary | ICD-10-CM | POA: Diagnosis not present

## 2014-01-26 DIAGNOSIS — R269 Unspecified abnormalities of gait and mobility: Secondary | ICD-10-CM | POA: Diagnosis not present

## 2014-01-26 DIAGNOSIS — IMO0001 Reserved for inherently not codable concepts without codable children: Secondary | ICD-10-CM | POA: Diagnosis not present

## 2014-01-30 DIAGNOSIS — R269 Unspecified abnormalities of gait and mobility: Secondary | ICD-10-CM | POA: Diagnosis not present

## 2014-01-30 DIAGNOSIS — M5030 Other cervical disc degeneration, unspecified cervical region: Secondary | ICD-10-CM | POA: Diagnosis not present

## 2014-01-30 DIAGNOSIS — M6281 Muscle weakness (generalized): Secondary | ICD-10-CM | POA: Diagnosis not present

## 2014-01-30 DIAGNOSIS — M545 Low back pain, unspecified: Secondary | ICD-10-CM | POA: Diagnosis not present

## 2014-01-30 DIAGNOSIS — M999 Biomechanical lesion, unspecified: Secondary | ICD-10-CM | POA: Diagnosis not present

## 2014-01-30 DIAGNOSIS — M9981 Other biomechanical lesions of cervical region: Secondary | ICD-10-CM | POA: Diagnosis not present

## 2014-01-30 DIAGNOSIS — IMO0001 Reserved for inherently not codable concepts without codable children: Secondary | ICD-10-CM | POA: Diagnosis not present

## 2014-01-31 ENCOUNTER — Telehealth: Payer: Self-pay | Admitting: Internal Medicine

## 2014-01-31 NOTE — Telephone Encounter (Signed)
Sent rx for diabetic shoes

## 2014-01-31 NOTE — Telephone Encounter (Signed)
[name] from [facility] said she received the office visit for 01/05/14 but it does not have a detailed diabetic foot exam on it.  She is going to advise the patient to make another appointment to have that done. Then she will need the records from that visit forwarded to her. You can fax that to: ([phone])

## 2014-02-01 DIAGNOSIS — R269 Unspecified abnormalities of gait and mobility: Secondary | ICD-10-CM | POA: Diagnosis not present

## 2014-02-01 DIAGNOSIS — M9981 Other biomechanical lesions of cervical region: Secondary | ICD-10-CM | POA: Diagnosis not present

## 2014-02-01 DIAGNOSIS — M5030 Other cervical disc degeneration, unspecified cervical region: Secondary | ICD-10-CM | POA: Diagnosis not present

## 2014-02-01 DIAGNOSIS — M999 Biomechanical lesion, unspecified: Secondary | ICD-10-CM | POA: Diagnosis not present

## 2014-02-01 DIAGNOSIS — IMO0001 Reserved for inherently not codable concepts without codable children: Secondary | ICD-10-CM | POA: Diagnosis not present

## 2014-02-01 DIAGNOSIS — M6281 Muscle weakness (generalized): Secondary | ICD-10-CM | POA: Diagnosis not present

## 2014-02-01 DIAGNOSIS — M545 Low back pain, unspecified: Secondary | ICD-10-CM | POA: Diagnosis not present

## 2014-02-01 NOTE — Telephone Encounter (Signed)
The foot exam was documented by his podiatrist, not me, so they need copy of podiatry OV, not my note

## 2014-02-02 ENCOUNTER — Ambulatory Visit (INDEPENDENT_AMBULATORY_CARE_PROVIDER_SITE_OTHER): Payer: Medicare Other | Admitting: *Deleted

## 2014-02-02 ENCOUNTER — Ambulatory Visit (INDEPENDENT_AMBULATORY_CARE_PROVIDER_SITE_OTHER): Payer: Medicare Other | Admitting: Internal Medicine

## 2014-02-02 ENCOUNTER — Encounter: Payer: Self-pay | Admitting: Internal Medicine

## 2014-02-02 VITALS — BP 122/70 | HR 70 | Temp 97.8°F | Wt 179.5 lb

## 2014-02-02 DIAGNOSIS — I2510 Atherosclerotic heart disease of native coronary artery without angina pectoris: Secondary | ICD-10-CM | POA: Diagnosis not present

## 2014-02-02 DIAGNOSIS — G609 Hereditary and idiopathic neuropathy, unspecified: Secondary | ICD-10-CM | POA: Diagnosis not present

## 2014-02-02 DIAGNOSIS — M2040 Other hammer toe(s) (acquired), unspecified foot: Secondary | ICD-10-CM

## 2014-02-02 DIAGNOSIS — I739 Peripheral vascular disease, unspecified: Secondary | ICD-10-CM | POA: Diagnosis not present

## 2014-02-02 DIAGNOSIS — R7309 Other abnormal glucose: Secondary | ICD-10-CM

## 2014-02-02 DIAGNOSIS — Z23 Encounter for immunization: Secondary | ICD-10-CM

## 2014-02-02 LAB — POCT INR: INR: 3

## 2014-02-02 NOTE — Progress Notes (Signed)
Pre visit review using our clinic review tool, if applicable. No additional management support is needed unless otherwise documented below in the visit note. 

## 2014-02-02 NOTE — Patient Instructions (Signed)
   The will be completed and sent to Dr. Irving Shows to support the prescription of orthotic footwear.

## 2014-02-02 NOTE — Progress Notes (Signed)
   Subjective:    Patient ID: [name], male    DOB: 03-22-1951, 63 y.o.   MRN: [id number]  HPI   He was sent here to be evaluated for orthotic shoes  He has severe peripheral vessel disease as well as idiopathic neuropathy. He questions whether this were related to HIV or its treatment  His fasting blood sugars range 81-90.  2 hours after meals less than 135  He is on metformin 500 mg once daily to prevent labile glucoses. Off metformin sugars run from the low 40s to 160.  His last A1c was 5.1 in June of this year. The last 5 years it has ranged from 4.6 -5.1.  Over the last 3 months his random glucoses ranged from 80-137.  He does have polydipsia.   Review of Systems  Polyuria & polyphagia absent.  No nonhealing skin lesions present. Weight is stable.      Objective:   Physical Exam Positive pertinent findings include: He has profound angulation of the mid to lower thoracic spine. There is absence light touch to the level of the knees bilaterally. He has varicosities of the lower extremities The toenails are absent; there is partial growth of 3 of the nails. He does have isolated hammer toe changes of the toes Pedal pulses are decreased particularly the dorsalis pedis pulses He ambulates with a cane Skin is tanned. General appearance :adequately nourished; in no distress. Eyes: No conjunctival inflammation or scleral icterus is present. Oral exam:  Lips and gums are healthy appearing.There is no oropharyngeal erythema or exudate noted.  Heart:  Normal rate and regular rhythm. S1 and S2 normal without gallop, murmur, click, rub or other extra sounds   Lungs:Chest clear to auscultation; no wheezes, rhonchi,rales ,or rubs present.No increased work of breathing.  Skin:Warm & dry.  Intact without suspicious lesions or rashes ; no jaundice or tenting Lymphatic: No lymphadenopathy is noted about the head, neck, axilla              Assessment & Plan:   Plan:  The footwear is appropriate because of the neuropathy ; peripheral vascular disease; nail disease;and hammer toe deformities.  He apparently has labile glucoses off metformin which is difficult to explain  He does not want to discontinue metformin. I told him that that would be up to his primary care physician.  An insulinoma would certainly be unlikely to exhibit this pattern.

## 2014-02-04 NOTE — Telephone Encounter (Signed)
Noted update - DM foot exam done 9/24 by [facility] for documentation - Thanks all

## 2014-02-06 ENCOUNTER — Telehealth: Payer: Self-pay | Admitting: Internal Medicine

## 2014-02-06 NOTE — Telephone Encounter (Signed)
Di Kindle called from [facility] regarding diabetic shoes.  She cannot read the script that was sent back to her.  Please call her at [phone].

## 2014-02-08 DIAGNOSIS — M76899 Other specified enthesopathies of unspecified lower limb, excluding foot: Secondary | ICD-10-CM | POA: Diagnosis not present

## 2014-02-08 DIAGNOSIS — M961 Postlaminectomy syndrome, not elsewhere classified: Secondary | ICD-10-CM | POA: Diagnosis not present

## 2014-02-08 DIAGNOSIS — G894 Chronic pain syndrome: Secondary | ICD-10-CM | POA: Diagnosis not present

## 2014-02-08 DIAGNOSIS — M25559 Pain in unspecified hip: Secondary | ICD-10-CM | POA: Diagnosis not present

## 2014-02-09 ENCOUNTER — Encounter (HOSPITAL_COMMUNITY): Payer: Self-pay | Admitting: Emergency Medicine

## 2014-02-09 ENCOUNTER — Emergency Department (HOSPITAL_COMMUNITY)
Admission: EM | Admit: 2014-02-09 | Discharge: 2014-02-09 | Disposition: A | Discharge status: Home / Self Care | Payer: Medicare Other | Attending: Emergency Medicine | Admitting: Emergency Medicine

## 2014-02-09 ENCOUNTER — Emergency Department (HOSPITAL_COMMUNITY): Payer: Medicare Other

## 2014-02-09 DIAGNOSIS — Z7952 Long term (current) use of systemic steroids: Secondary | ICD-10-CM | POA: Insufficient documentation

## 2014-02-09 DIAGNOSIS — Z951 Presence of aortocoronary bypass graft: Secondary | ICD-10-CM | POA: Insufficient documentation

## 2014-02-09 DIAGNOSIS — Z8719 Personal history of other diseases of the digestive system: Secondary | ICD-10-CM | POA: Insufficient documentation

## 2014-02-09 DIAGNOSIS — I10 Essential (primary) hypertension: Secondary | ICD-10-CM | POA: Insufficient documentation

## 2014-02-09 DIAGNOSIS — Z79899 Other long term (current) drug therapy: Secondary | ICD-10-CM | POA: Diagnosis not present

## 2014-02-09 DIAGNOSIS — Z8619 Personal history of other infectious and parasitic diseases: Secondary | ICD-10-CM | POA: Diagnosis not present

## 2014-02-09 DIAGNOSIS — R1013 Epigastric pain: Secondary | ICD-10-CM | POA: Diagnosis not present

## 2014-02-09 DIAGNOSIS — Z8739 Personal history of other diseases of the musculoskeletal system and connective tissue: Secondary | ICD-10-CM | POA: Insufficient documentation

## 2014-02-09 DIAGNOSIS — Z8673 Personal history of transient ischemic attack (TIA), and cerebral infarction without residual deficits: Secondary | ICD-10-CM | POA: Insufficient documentation

## 2014-02-09 DIAGNOSIS — Z8639 Personal history of other endocrine, nutritional and metabolic disease: Secondary | ICD-10-CM | POA: Diagnosis not present

## 2014-02-09 DIAGNOSIS — Z9889 Other specified postprocedural states: Secondary | ICD-10-CM | POA: Diagnosis not present

## 2014-02-09 DIAGNOSIS — I2510 Atherosclerotic heart disease of native coronary artery without angina pectoris: Secondary | ICD-10-CM | POA: Diagnosis not present

## 2014-02-09 DIAGNOSIS — Z7982 Long term (current) use of aspirin: Secondary | ICD-10-CM | POA: Diagnosis not present

## 2014-02-09 DIAGNOSIS — Z87448 Personal history of other diseases of urinary system: Secondary | ICD-10-CM | POA: Insufficient documentation

## 2014-02-09 DIAGNOSIS — Z86718 Personal history of other venous thrombosis and embolism: Secondary | ICD-10-CM | POA: Diagnosis not present

## 2014-02-09 DIAGNOSIS — Z86010 Personal history of colonic polyps: Secondary | ICD-10-CM | POA: Diagnosis not present

## 2014-02-09 DIAGNOSIS — Z880 Allergy status to penicillin: Secondary | ICD-10-CM | POA: Diagnosis not present

## 2014-02-09 DIAGNOSIS — I252 Old myocardial infarction: Secondary | ICD-10-CM | POA: Insufficient documentation

## 2014-02-09 DIAGNOSIS — Z7901 Long term (current) use of anticoagulants: Secondary | ICD-10-CM | POA: Insufficient documentation

## 2014-02-09 DIAGNOSIS — Z0389 Encounter for observation for other suspected diseases and conditions ruled out: Secondary | ICD-10-CM | POA: Diagnosis not present

## 2014-02-09 DIAGNOSIS — K219 Gastro-esophageal reflux disease without esophagitis: Secondary | ICD-10-CM | POA: Diagnosis not present

## 2014-02-09 DIAGNOSIS — R112 Nausea with vomiting, unspecified: Secondary | ICD-10-CM | POA: Diagnosis not present

## 2014-02-09 DIAGNOSIS — G8929 Other chronic pain: Secondary | ICD-10-CM | POA: Insufficient documentation

## 2014-02-09 DIAGNOSIS — R109 Unspecified abdominal pain: Secondary | ICD-10-CM | POA: Diagnosis not present

## 2014-02-09 DIAGNOSIS — G709 Myoneural disorder, unspecified: Secondary | ICD-10-CM | POA: Diagnosis not present

## 2014-02-09 DIAGNOSIS — Z8709 Personal history of other diseases of the respiratory system: Secondary | ICD-10-CM | POA: Diagnosis not present

## 2014-02-09 DIAGNOSIS — Z21 Asymptomatic human immunodeficiency virus [HIV] infection status: Secondary | ICD-10-CM | POA: Insufficient documentation

## 2014-02-09 LAB — URINALYSIS, ROUTINE W REFLEX MICROSCOPIC
Bilirubin Urine: NEGATIVE
Glucose, UA: NEGATIVE mg/dL
Hgb urine dipstick: NEGATIVE
Ketones, ur: NEGATIVE mg/dL
Leukocytes, UA: NEGATIVE
Nitrite: NEGATIVE
Protein, ur: NEGATIVE mg/dL
Specific Gravity, Urine: 1.019 (ref 1.005–1.030)
Urobilinogen, UA: 1 mg/dL (ref 0.0–1.0)
pH: 6.5 (ref 5.0–8.0)

## 2014-02-09 LAB — CBC WITH DIFFERENTIAL/PLATELET
Basophils Absolute: 0 10*3/uL (ref 0.0–0.1)
Basophils Relative: 0 % (ref 0–1)
Eosinophils Absolute: 0 10*3/uL (ref 0.0–0.7)
Eosinophils Relative: 0 % (ref 0–5)
HCT: 40.2 % (ref 39.0–52.0)
Hemoglobin: 13.9 g/dL (ref 13.0–17.0)
Lymphocytes Relative: 41 % (ref 12–46)
Lymphs Abs: 2.7 10*3/uL (ref 0.7–4.0)
MCH: 40.9 pg — ABNORMAL HIGH (ref 26.0–34.0)
MCHC: 34.6 g/dL (ref 30.0–36.0)
MCV: 118.2 fL — ABNORMAL HIGH (ref 78.0–100.0)
Monocytes Absolute: 0.3 10*3/uL (ref 0.1–1.0)
Monocytes Relative: 5 % (ref 3–12)
Neutro Abs: 3.5 10*3/uL (ref 1.7–7.7)
Neutrophils Relative %: 54 % (ref 43–77)
Platelets: 122 10*3/uL — ABNORMAL LOW (ref 150–400)
RBC: 3.4 MIL/uL — ABNORMAL LOW (ref 4.22–5.81)
RDW: 13.8 % (ref 11.5–15.5)
WBC: 6.5 10*3/uL (ref 4.0–10.5)

## 2014-02-09 LAB — COMPREHENSIVE METABOLIC PANEL
ALT: 19 U/L (ref 0–53)
AST: 25 U/L (ref 0–37)
Albumin: 4 g/dL (ref 3.5–5.2)
Alkaline Phosphatase: 79 U/L (ref 39–117)
Anion gap: 12 (ref 5–15)
BUN: 22 mg/dL (ref 6–23)
CO2: 26 mEq/L (ref 19–32)
Calcium: 8.6 mg/dL (ref 8.4–10.5)
Chloride: 99 mEq/L (ref 96–112)
Creatinine, Ser: 0.72 mg/dL (ref 0.50–1.35)
GFR calc Af Amer: >90 mL/min (ref >90)
GFR calc non Af Amer: >90 mL/min (ref >90)
Glucose, Bld: 104 mg/dL — ABNORMAL HIGH (ref 70–99)
Potassium: 4.5 mEq/L (ref 3.7–5.3)
Sodium: 137 mEq/L (ref 137–147)
Total Bilirubin: 0.7 mg/dL (ref 0.3–1.2)
Total Protein: 6.4 g/dL (ref 6.0–8.3)

## 2014-02-09 LAB — PROTIME-INR
INR: 2.39 — ABNORMAL HIGH (ref 0.00–1.49)
Prothrombin Time: 26.1 seconds — ABNORMAL HIGH (ref 11.6–15.2)

## 2014-02-09 LAB — I-STAT TROPONIN, ED: Troponin i, poc: 0 ng/mL (ref 0.00–0.08)

## 2014-02-09 LAB — LIPASE, BLOOD: Lipase: 22 U/L (ref 11–59)

## 2014-02-09 MED ORDER — SODIUM CHLORIDE 0.9 % IV BOLUS (SEPSIS)
1000.0000 mL | Freq: Once | INTRAVENOUS | Status: AC
  Administered 2014-02-09: 1000 mL via INTRAVENOUS

## 2014-02-09 MED ORDER — ONDANSETRON HCL 4 MG/2ML IJ SOLN
4.0000 mg | Freq: Once | INTRAMUSCULAR | Status: AC
  Administered 2014-02-09: 4 mg via INTRAVENOUS
  Filled 2014-02-09: qty 2

## 2014-02-09 MED ORDER — ONDANSETRON 4 MG PO TBDP: ORAL_TABLET | ORAL | Status: DC

## 2014-02-09 NOTE — ED Provider Notes (Signed)
CSN: [id number]     Arrival date & time 02/09/14  1134 History   First MD Initiated Contact with Patient 02/09/14 1146     Chief Complaint  Patient presents with  . Emesis     (Consider location/radiation/quality/duration/timing/severity/associated sxs/prior Treatment) The history is provided by the patient.  [name] is a 63 y.o. male hx of arthritis, HIV (CD 4 670, Viral load undetectable), GERD, multiple abdominal surgeries here with vomiting. Was prescribed prednisone yesterday by ortho for some inflammation of his joints. He was driving today and suddenly felt nauseated and vomited 3 times. Mostly clear vomiting, nonbilious and nonbloody. Has epigastric pain as well. No diarrhea. Has multiple abdominal surgeries but no hx of SBO.    Past Medical History  Diagnosis Date  . Osteoarthritis, knee     s/p B TKA  . HIV infection dx 1993  . TIA (transient ischemic attack) 1997    mild residual L mouth droop  . H/O hiatal hernia 2008    surgery  . Gynecomastia, male   . Impotence of organic origin   . Rheumatoid arthritis(714.0) dx 2010    MTX, follows with rheum  . Gout   . Chronic back pain     follows with Nsurg  . Coronary artery disease 2010    s/p CABG '10, sees Dr. Percival Spanish  . Seasonal allergies   . Diverticulosis   . Gallstones   . Status post dilation of esophageal narrowing   . GERD (gastroesophageal reflux disease)   . Diverticulosis   . Esophagitis   . Hemorrhoids   . Tubular adenoma of colon   . Myocardial infarction 2010     x 2  . Hypertension   . DVT, lower extremity, recurrent 2008, 2009    LLE, chronic anticoag since 2009  . Carotid artery occlusion     40-60% right ICA stenosis (09/2008)  . Neuromuscular disorder     neuropathy  . Fibromyalgia   . Hepatitis A yrs ago  . CLL (chronic lymphoblastic leukemia) dx 2010    Followed at [facility] q78mo, no current therapy    Past Surgical History  Procedure Laterality Date  . Spine surgery  2010     "rod and screws", "failed", lopwer spine,   . Cholecystectomy    . Hiatal hernia repair      wrap  . Shoulder surgery Left   . Mandible surgery Bilateral     tmj  . Varicose vein      stripping  . Replacement total knee Bilateral   . Knee arthroplasty  07/22/2011    Procedure: COMPUTER ASSISTED TOTAL KNEE ARTHROPLASTY;  Surgeon: Meredith Pel, MD;  Location: [facility];  Service: Orthopedics;  Laterality: Right;  Right total knee arthroplasty  . Coronary artery bypass graft  2010    triple bypass  . Hardware removal N/A 07/02/2012    Procedure: HARDWARE REMOVAL;  Surgeon: Elaina Hoops, MD;  Location: [facility];  Service: Neurosurgery;  Laterality: N/A;  . Inguinal hernia repair Bilateral   . Umbilical hernia repair      x 1  . Rotator cuff repair Right   . Esophagogastroduodenoscopy (egd) with propofol N/A 12/28/2012    Procedure: ESOPHAGOGASTRODUODENOSCOPY (EGD) WITH PROPOFOL;  Surgeon: Jerene Bears, MD;  Location: [facility];  Service: Gastroenterology;  Laterality: N/A;  . Colonoscopy with propofol N/A 12/28/2012    Procedure: COLONOSCOPY WITH PROPOFOL;  Surgeon: Jerene Bears, MD;  Location: [facility];  Service: Gastroenterology;  Laterality: N/A;  . Joint replacement Left 1999  . Tonsillectomy    . Ring around testicle hernia reapir  184 and 1986    x 2  . Esophagogastroduodenoscopy (egd) with propofol N/A 03/15/2013    Procedure: ESOPHAGOGASTRODUODENOSCOPY (EGD) WITH PROPOFOL;  Surgeon: Jerene Bears, MD;  Location: [facility];  Service: Gastroenterology;  Laterality: N/A;   Family History  Problem Relation Age of Onset  . Breast cancer Mother   . Hypertension Mother   . Hyperlipidemia Mother   . Diabetes Mother   . Prostate cancer Father   . Colon polyps Father   . Hyperlipidemia Father   . Crohn's disease Paternal Aunt   . Diabetes Maternal Grandmother   . Diabetes Brother     x 3  . Heart disease Brother     x 3  . Hyperlipidemia Brother     x 3  . Colon  cancer Neg Hx   . Alcohol abuse Daughter   . Drug abuse Daughter    History  Substance Use Topics  . Smoking status: Never Smoker   . Smokeless tobacco: Never Used     Comment: occ wine  . Alcohol Use: Yes     Comment: occasional wine- 1-2 per week    Review of Systems  Gastrointestinal: Positive for nausea, vomiting and abdominal pain.  All other systems reviewed and are negative.     Allergies  Other; Peanut-containing drug products; Morphine; Oxycodone-acetaminophen; Penicillins; and Promethazine hcl  Home Medications   Prior to Admission medications   Medication Sig Start Date End Date Taking? Authorizing Provider  aspirin EC 81 MG tablet Take 81 mg by mouth daily.   Yes Historical Provider, MD  atorvastatin (LIPITOR) 10 MG tablet Take 1 tablet (10 mg total) by mouth at bedtime. 10/19/13  Yes [name], MD  Calcium Carb-Cholecalciferol (CALCIUM 1000 + D PO) Take 1 tablet by mouth daily.   Yes Historical Provider, MD  Darunavir Ethanolate (PREZISTA) 800 MG tablet Take 1 tablet (800 mg total) by mouth daily. 12/19/13  Yes Thayer Headings, MD  EPINEPHrine (EPI-PEN) 0.3 mg/0.3 mL SOAJ Inject 0.3 mg into the muscle once. As needed for allergic reaction   Yes Historical Provider, MD  escitalopram (LEXAPRO) 10 MG tablet Take 1 tablet (10 mg total) by mouth daily. 08/24/13 08/24/14 Yes Levonne Spiller, MD  gabapentin (NEURONTIN) 300 MG capsule Take 1 capsule (300 mg total) by mouth 4 (four) times daily. 10/27/13  Yes Thayer Headings, MD  inFLIXimab (REMICADE) 100 MG injection Inject into the vein every 6 (six) weeks.    Yes Historical Provider, MD  lamiVUDine-zidovudine (COMBIVIR) 150-300 MG per tablet Take 1 tablet by mouth 2 (two) times daily. 12/19/13  Yes Thayer Headings, MD  loratadine (CLARITIN) 10 MG tablet Take 10 mg by mouth daily as needed. 04/21/13  Yes Rowe Clack, MD  metFORMIN (GLUCOPHAGE XR) 500 MG 24 hr tablet Take 1 tablet (500 mg total) by mouth daily with  breakfast. 07/08/13  Yes Rowe Clack, MD  methocarbamol (ROBAXIN) 500 MG tablet Take 500 mg by mouth every 8 (eight) hours as needed for muscle spasms.   Yes Historical Provider, MD  metoprolol succinate (TOPROL-XL) 25 MG 24 hr tablet Take 1 tablet (25 mg total) by mouth at bedtime. 12/19/13  Yes Satira Sark, MD  mometasone (NASONEX) 50 MCG/ACT nasal spray Place 2 sprays into the nose daily as needed (for allergies).  04/21/13  Yes Rowe Clack, MD  pantoprazole (PROTONIX) 40 MG tablet Take 40 mg by mouth daily.   Yes Historical Provider, MD  predniSONE (STERAPRED UNI-PAK) 5 MG TABS tablet Take 5-30 mg by mouth See admin instructions. Prednisone 6 day pack 6,5,4,3,2,1   Yes Historical Provider, MD  raltegravir (ISENTRESS) 400 MG tablet Take 400 mg by mouth 2 (two) times daily.   Yes Historical Provider, MD  ritonavir (NORVIR) 100 MG capsule Take 100 mg by mouth daily with breakfast.   Yes Historical Provider, MD  saccharomyces boulardii (FLORASTOR) 250 MG capsule Take 1 capsule (250 mg total) by mouth 2 (two) times daily. 04/15/13  Yes Jerene Bears, MD  traMADol (ULTRAM) 50 MG tablet Take 50-100 mg by mouth every 6 (six) hours as needed (for pain).   Yes Historical Provider, MD  valACYclovir (VALTREX) 500 MG tablet Take 1 tablet (500 mg total) by mouth daily. 03/15/13  Yes Thayer Headings, MD  warfarin (COUMADIN) 10 MG tablet Take 5-10 mg by mouth daily. Takes 1/2 tab (5 mg) on Wednesday takes 1 tablet (10 mg) all other days   Yes Historical Provider, MD  zolpidem (AMBIEN) 5 MG tablet Take 1 tablet (5 mg total) by mouth at bedtime as needed for sleep. 11/10/13  Yes Biagio Borg, MD   BP 119/69  Pulse 57  Temp(Src) 97.4 F (36.3 C) (Oral)  Resp 15  Ht 5\' 9"  (1.753 m)  Wt 170 lb (77.111 kg)  BMI 25.09 kg/m2  SpO2 100% Physical Exam  Nursing note and vitals reviewed. Constitutional: He is oriented to person, place, and time.  Uncomfortable, vomiting   HENT:  Head: Normocephalic.   MM dry. L ear with dry blood, TM intact. (Patient used Q tip to clean out ear today)  Eyes: Conjunctivae are normal. Pupils are equal, round, and reactive to light.  Neck: Normal range of motion. Neck supple.  Cardiovascular: Normal rate, regular rhythm and normal heart sounds.   Pulmonary/Chest: Effort normal and breath sounds normal. No respiratory distress. He has no wheezes. He has no rales.  Abdominal: Soft. Bowel sounds are normal.  Minimal epigastric tenderness, no rebound   Musculoskeletal: Normal range of motion. He exhibits no edema.  Neurological: He is alert and oriented to person, place, and time. No cranial nerve deficit. Coordination normal.  Skin: Skin is warm and dry.  Psychiatric: He has a normal mood and affect. His behavior is normal. Judgment and thought content normal.    ED Course  Procedures (including critical care time) Labs Review Labs Reviewed  CBC WITH DIFFERENTIAL - Abnormal; Notable for the following:    RBC 3.40 (*)    MCV 118.2 (*)    MCH 40.9 (*)    Platelets 122 (*)    All other components within normal limits  COMPREHENSIVE METABOLIC PANEL - Abnormal; Notable for the following:    Glucose, Bld 104 (*)    All other components within normal limits  PROTIME-INR - Abnormal; Notable for the following:    Prothrombin Time 26.1 (*)    INR 2.39 (*)    All other components within normal limits  LIPASE, BLOOD  URINALYSIS, ROUTINE W REFLEX MICROSCOPIC  I-STAT TROPOININ, ED    Imaging Review Dg Abd Acute W/chest  02/09/2014   CLINICAL DATA:  Abdominal pain.  Question small bowel obstruction.  EXAM: ACUTE ABDOMEN SERIES (ABDOMEN 2 VIEW & CHEST 1 VIEW)  COMPARISON:  PA and lateral chest 11/02/2013. CT chest, abdomen and pelvis 05/19/2013.  FINDINGS: FINDINGS  Single view  of the chest demonstrates clear lungs and normal heart size. Hiatal hernia is noted. No pneumothorax or pleural effusion. Postoperative change right shoulder noted.  Two views of the  abdomen show no free intraperitoneal air. The bowel gas pattern is nonobstructive. Large volume of stool in the colon is noted. The patient is status post lumbar fusion. Convex left scoliosis is identified.  IMPRESSION: No acute finding chest or abdomen.   Electronically Signed   By: Inge Rise M.D.   On: 02/09/2014 13:18     EKG Interpretation   Date/Time:  Thursday February 09 2014 12:32:33 EDT Ventricular Rate:  56 PR Interval:  162 QRS Duration: 87 QT Interval:  433 QTC Calculation: 418 R Axis:   6 Text Interpretation:  Sinus rhythm Low voltage, precordial leads  Anteroseptal infarct, old Nonspecific T abnormalities, lateral leads No  significant change since last tracing Confirmed by Karene Bracken  MD, Keyana Guevara ([id number])  on 02/09/2014 2:04:29 PM      MDM   Final diagnoses:  None    [name] is a 63 y.o. male here with vomiting. Likely gastro. Given hx of multiple surgeries, will get xray to r/o SBO. Will hydrate and reassess.   3:30 PM Xrays showed no SBO. Labs at baseline. Felt better after fluids. Tolerated PO. Will d/c home with prn zofran.     Wandra Arthurs, MD 02/09/14 [id number]

## 2014-02-09 NOTE — ED Notes (Signed)
Patient given fluids for PO trial

## 2014-02-09 NOTE — ED Notes (Signed)
Brought pt back to room via wheelchair; pt getting undressed and into a gown at this time; Nicki Reaper, RN aware of pt

## 2014-02-09 NOTE — Discharge Instructions (Signed)
Stay hydrated.   Take zofran for nausea.   Hold steroids for now.   Follow up with your doctor.   Return to ER if you have vomiting, severe pain, abdominal pain, not passing gas.

## 2014-02-09 NOTE — ED Notes (Signed)
Attempted IV x2. Unable to obtain. Sharyn Lull, RN at the bedside attempting.

## 2014-02-09 NOTE — ED Notes (Signed)
Patient transported to X-ray 

## 2014-02-09 NOTE — ED Notes (Signed)
Patient returned from X-ray 

## 2014-02-09 NOTE — ED Notes (Addendum)
Pt in stating he started vomiting about 30 min ago, states the nausea hit him after he took some prednisone that he was taking for some joint aches, since that time nausea and dizziness have been constant. Pt actively vomiting in triage. Pt denies pain.

## 2014-02-16 ENCOUNTER — Other Ambulatory Visit (INDEPENDENT_AMBULATORY_CARE_PROVIDER_SITE_OTHER): Payer: Medicare Other

## 2014-02-16 ENCOUNTER — Other Ambulatory Visit (HOSPITAL_COMMUNITY)
Admission: RE | Admit: 2014-02-16 | Discharge: 2014-02-16 | Disposition: A | Discharge status: Home / Self Care | Payer: Medicare Other | Source: Ambulatory Visit | Attending: Internal Medicine | Admitting: Internal Medicine

## 2014-02-16 DIAGNOSIS — L570 Actinic keratosis: Secondary | ICD-10-CM | POA: Diagnosis not present

## 2014-02-16 DIAGNOSIS — B20 Human immunodeficiency virus [HIV] disease: Secondary | ICD-10-CM | POA: Diagnosis not present

## 2014-02-16 DIAGNOSIS — D1801 Hemangioma of skin and subcutaneous tissue: Secondary | ICD-10-CM | POA: Diagnosis not present

## 2014-02-16 DIAGNOSIS — Z113 Encounter for screening for infections with a predominantly sexual mode of transmission: Secondary | ICD-10-CM

## 2014-02-16 DIAGNOSIS — Z79899 Other long term (current) drug therapy: Secondary | ICD-10-CM | POA: Diagnosis not present

## 2014-02-16 DIAGNOSIS — M0589 Other rheumatoid arthritis with rheumatoid factor of multiple sites: Secondary | ICD-10-CM | POA: Diagnosis not present

## 2014-02-16 DIAGNOSIS — M0579 Rheumatoid arthritis with rheumatoid factor of multiple sites without organ or systems involvement: Secondary | ICD-10-CM | POA: Diagnosis not present

## 2014-02-16 DIAGNOSIS — L821 Other seborrheic keratosis: Secondary | ICD-10-CM | POA: Diagnosis not present

## 2014-02-16 DIAGNOSIS — D229 Melanocytic nevi, unspecified: Secondary | ICD-10-CM | POA: Diagnosis not present

## 2014-02-16 LAB — RPR

## 2014-02-16 LAB — COMPLETE METABOLIC PANEL WITH GFR
ALT: 19 U/L (ref 0–53)
AST: 25 U/L (ref 0–37)
Albumin: 3.7 g/dL (ref 3.5–5.2)
Alkaline Phosphatase: 77 U/L (ref 39–117)
BUN: 21 mg/dL (ref 6–23)
CO2: 27 mEq/L (ref 19–32)
Calcium: 8.2 mg/dL — ABNORMAL LOW (ref 8.4–10.5)
Chloride: 103 mEq/L (ref 96–112)
Creat: 0.75 mg/dL (ref 0.50–1.35)
GFR, Est African American: >89 mL/min
GFR, Est Non African American: >89 mL/min
Glucose, Bld: 73 mg/dL (ref 70–99)
Potassium: 3.7 mEq/L (ref 3.5–5.3)
Sodium: 138 mEq/L (ref 135–145)
Total Bilirubin: 0.8 mg/dL (ref 0.2–1.2)
Total Protein: 5.6 g/dL — ABNORMAL LOW (ref 6.0–8.3)

## 2014-02-16 LAB — LIPID PANEL
Cholesterol: 110 mg/dL (ref 0–200)
HDL: 31 mg/dL — ABNORMAL LOW (ref >39)
LDL Cholesterol: 57 mg/dL (ref 0–99)
Total CHOL/HDL Ratio: 3.5 Ratio
Triglycerides: 108 mg/dL (ref <150)
VLDL: 22 mg/dL (ref 0–40)

## 2014-02-17 LAB — T-HELPER CELL (CD4) - (RCID CLINIC ONLY)
CD4 % Helper T Cell: 14 % — ABNORMAL LOW (ref 33–55)
CD4 T Cell Abs: 800 /uL (ref 400–2700)

## 2014-02-17 LAB — URINE CYTOLOGY ANCILLARY ONLY
Chlamydia: NEGATIVE
Neisseria Gonorrhea: NEGATIVE

## 2014-02-17 LAB — HIV-1 RNA QUANT-NO REFLEX-BLD
HIV 1 RNA Quant: 23 copies/mL — ABNORMAL HIGH (ref <20)
HIV-1 RNA Quant, Log: 1.36 {Log} — ABNORMAL HIGH (ref <1.30)

## 2014-02-20 DIAGNOSIS — I8291 Chronic embolism and thrombosis of unspecified vein: Secondary | ICD-10-CM | POA: Diagnosis not present

## 2014-02-20 DIAGNOSIS — Z7901 Long term (current) use of anticoagulants: Secondary | ICD-10-CM | POA: Diagnosis not present

## 2014-02-20 DIAGNOSIS — I82409 Acute embolism and thrombosis of unspecified deep veins of unspecified lower extremity: Secondary | ICD-10-CM | POA: Diagnosis not present

## 2014-02-21 ENCOUNTER — Ambulatory Visit (INDEPENDENT_AMBULATORY_CARE_PROVIDER_SITE_OTHER): Payer: Medicare Other | Admitting: *Deleted

## 2014-02-21 LAB — POCT INR: INR: 2.7

## 2014-02-21 MED ORDER — WARFARIN SODIUM 10 MG PO TABS: 5.0000 mg | ORAL_TABLET | Freq: Every day | ORAL | Status: DC

## 2014-02-23 ENCOUNTER — Telehealth: Payer: Self-pay | Admitting: Internal Medicine

## 2014-02-23 NOTE — Telephone Encounter (Signed)
Pt called stating he was seen Sept 2015 to get foot exam for prescription for diabetic shoes. He states what was sent to pharm from this office was not legible (and he has not rec'd) and is requesting foot exam with Dr Asa Lente to get proper diabetic shoes. Offered appt with another provider due to MD's lack of appts at the moment. He would prefer to see Dr Asa Lente and would like her to advise.

## 2014-02-27 NOTE — Telephone Encounter (Signed)
No OV needed as repeat foot exam NOT necessary - this was done appropriately by [facility]! What is needed is new rx for shoes with proper readable documentation -  Please clarify with the supply agency to correct rx/paperwork - no OV needed for pt for this issue thanks

## 2014-02-28 NOTE — Telephone Encounter (Signed)
Called pt. Supply agency was verified.   Called [facility]) and spoke to Franklin [phone]).   I have faxed her the other documentation that she needed for insurance and for her facility to complete order for the patient.

## 2014-03-02 ENCOUNTER — Encounter: Payer: Self-pay | Admitting: Internal Medicine

## 2014-03-02 ENCOUNTER — Ambulatory Visit (INDEPENDENT_AMBULATORY_CARE_PROVIDER_SITE_OTHER): Payer: Medicare Other | Admitting: Internal Medicine

## 2014-03-02 VITALS — BP 144/78 | HR 72 | Temp 97.5°F | Wt 174.0 lb

## 2014-03-02 DIAGNOSIS — I2510 Atherosclerotic heart disease of native coronary artery without angina pectoris: Secondary | ICD-10-CM | POA: Diagnosis not present

## 2014-03-02 DIAGNOSIS — B20 Human immunodeficiency virus [HIV] disease: Secondary | ICD-10-CM

## 2014-03-02 DIAGNOSIS — Z23 Encounter for immunization: Secondary | ICD-10-CM

## 2014-03-02 NOTE — Assessment & Plan Note (Signed)
I discussed his regimen, especially Combivir.  Though he has done well with the current regimen, I am concerned with the neuropathy.  I would like to get him off Combivir.  I am going to have him check with his insurance to see if I can order a Monogram Archived Genosure to get an archived DNA of his virus and see what is resistant.  He needs a ppi, so will need to avoid rilpivirine, but will consider etravirine or even Truvada in addition to his Prezista/r and Isentress.   He will RTC in 4 months or sooner if we are able to get the test.

## 2014-03-02 NOTE — Progress Notes (Signed)
  Subjective:    Patient ID: BRACE WELTE, male    DOB: December 18, 1950, 63 y.o.   MRN: [id number]  HPI He comes in for routine follow up.  He continues on Prezista with norvir, Combivir and Isentress.  He denies any missed doses.  He feels well on remicaide for RA.   He though continues to do well with his change of regimen with labs showing undetectable viral load and good CD4 count. No missed doses.  Having more problems with peripheral neuropathy, on neurontin but does not feel it is as good as lyrica.  Had hep B #2 last visit.     Review of Systems  Constitutional: Negative for fever and fatigue.  HENT: Negative for sore throat and trouble swallowing.   Eyes: Negative for visual disturbance.  Respiratory: Negative for shortness of breath.   Gastrointestinal: Negative for nausea, abdominal pain, diarrhea and abdominal distention.  Musculoskeletal: Positive for arthralgias.       Much improved on remicaide  Skin: Negative for rash.  Neurological: Negative for dizziness and headaches.  Hematological: Negative for adenopathy.  Psychiatric/Behavioral: Negative for dysphoric mood.       Objective:   Physical Exam  Constitutional: He is oriented to person, place, and time. He appears well-developed.  HENT:  Mouth/Throat: No oropharyngeal exudate.  Eyes: Right eye exhibits no discharge. Left eye exhibits no discharge. No scleral icterus.  Cardiovascular: Normal rate, regular rhythm and normal heart sounds.   No murmur heard. Pulmonary/Chest: Effort normal and breath sounds normal. No respiratory distress. He has no wheezes.  Musculoskeletal:  Spinal deformity Changes of RA in hands  Lymphadenopathy:    He has no cervical adenopathy.  Neurological: He is alert and oriented to person, place, and time.  Skin: Skin is warm and dry. No rash noted.  Psychiatric: He has a normal mood and affect. His behavior is normal.          Assessment & Plan:

## 2014-03-02 NOTE — Addendum Note (Signed)
Addended by: Myrtis Hopping A on: 03/02/2014 12:22 PM   Modules accepted: Orders

## 2014-03-08 ENCOUNTER — Other Ambulatory Visit: Payer: Self-pay | Admitting: Internal Medicine

## 2014-03-08 ENCOUNTER — Ambulatory Visit (INDEPENDENT_AMBULATORY_CARE_PROVIDER_SITE_OTHER): Payer: Medicare Other | Admitting: *Deleted

## 2014-03-08 DIAGNOSIS — B029 Zoster without complications: Secondary | ICD-10-CM

## 2014-03-08 LAB — POCT INR: INR: 2.7

## 2014-03-15 ENCOUNTER — Telehealth: Payer: Self-pay

## 2014-03-15 NOTE — Telephone Encounter (Signed)
LVM for pt to call back if there was anything that he needed and if there was any delay in receiving his diabetic shoes.

## 2014-03-15 NOTE — Telephone Encounter (Signed)
LVM for pt to call back if there were any issues with him receiving or attaining his diabetic shoes.

## 2014-03-18 ENCOUNTER — Other Ambulatory Visit: Payer: Self-pay | Admitting: Internal Medicine

## 2014-03-30 DIAGNOSIS — M0589 Other rheumatoid arthritis with rheumatoid factor of multiple sites: Secondary | ICD-10-CM | POA: Diagnosis not present

## 2014-03-30 DIAGNOSIS — Z21 Asymptomatic human immunodeficiency virus [HIV] infection status: Secondary | ICD-10-CM | POA: Diagnosis not present

## 2014-03-30 DIAGNOSIS — C9110 Chronic lymphocytic leukemia of B-cell type not having achieved remission: Secondary | ICD-10-CM | POA: Diagnosis not present

## 2014-04-03 ENCOUNTER — Ambulatory Visit: Payer: Self-pay | Admitting: Certified Registered Nurse Anesthetist

## 2014-04-03 LAB — POCT INR: INR: 2.8

## 2014-04-13 ENCOUNTER — Other Ambulatory Visit: Payer: Self-pay | Admitting: Internal Medicine

## 2014-04-18 DIAGNOSIS — L84 Corns and callosities: Secondary | ICD-10-CM | POA: Diagnosis not present

## 2014-04-18 DIAGNOSIS — B351 Tinea unguium: Secondary | ICD-10-CM | POA: Diagnosis not present

## 2014-04-18 DIAGNOSIS — E1142 Type 2 diabetes mellitus with diabetic polyneuropathy: Secondary | ICD-10-CM | POA: Diagnosis not present

## 2014-04-21 ENCOUNTER — Telehealth: Payer: Self-pay | Admitting: *Deleted

## 2014-04-21 NOTE — Telephone Encounter (Signed)
Patient brought in form for insurance relating to just HIV and Hep C. Will call patient when completed and provide him with a copy. Form placed in Dr. Henreitta Leber box. Myrtis Hopping CMA

## 2014-04-25 DIAGNOSIS — I82409 Acute embolism and thrombosis of unspecified deep veins of unspecified lower extremity: Secondary | ICD-10-CM | POA: Diagnosis not present

## 2014-04-25 DIAGNOSIS — Z7901 Long term (current) use of anticoagulants: Secondary | ICD-10-CM | POA: Diagnosis not present

## 2014-04-25 DIAGNOSIS — I8291 Chronic embolism and thrombosis of unspecified vein: Secondary | ICD-10-CM | POA: Diagnosis not present

## 2014-04-27 ENCOUNTER — Encounter: Payer: Self-pay | Admitting: Internal Medicine

## 2014-04-27 ENCOUNTER — Ambulatory Visit (INDEPENDENT_AMBULATORY_CARE_PROVIDER_SITE_OTHER): Payer: Medicare Other | Admitting: Internal Medicine

## 2014-04-27 VITALS — BP 130/60 | HR 63 | Temp 97.9°F | Ht 69.0 in | Wt 176.0 lb

## 2014-04-27 DIAGNOSIS — J069 Acute upper respiratory infection, unspecified: Secondary | ICD-10-CM | POA: Diagnosis not present

## 2014-04-27 DIAGNOSIS — I2510 Atherosclerotic heart disease of native coronary artery without angina pectoris: Secondary | ICD-10-CM

## 2014-04-27 NOTE — Progress Notes (Signed)
Pre visit review using our clinic review tool, if applicable. No additional management support is needed unless otherwise documented below in the visit note. 

## 2014-04-27 NOTE — Progress Notes (Signed)
Subjective:    Patient ID: [name], male    DOB: December 09, 1950, 63 y.o.   MRN: [id number]  HPI  Patient here for URI symptoms  Also reviewed chronic medical issues and interval medical events  Past Medical History  Diagnosis Date  . Osteoarthritis, knee     s/p B TKA  . HIV infection dx 1993  . TIA (transient ischemic attack) 1997    mild residual L mouth droop  . H/O hiatal hernia 2008    surgery  . Gynecomastia, male   . Impotence of organic origin   . Rheumatoid arthritis(714.0) dx 2010    MTX, follows with rheum  . Gout   . Chronic back pain     follows with Nsurg  . Coronary artery disease 2010    s/p CABG '10, sees Dr. Percival Spanish  . Seasonal allergies   . Diverticulosis   . Gallstones   . Status post dilation of esophageal narrowing   . GERD (gastroesophageal reflux disease)   . Diverticulosis   . Esophagitis   . Hemorrhoids   . Tubular adenoma of colon   . Myocardial infarction 2010     x 2  . Hypertension   . DVT, lower extremity, recurrent 2008, 2009    LLE, chronic anticoag since 2009  . Carotid artery occlusion     40-60% right ICA stenosis (09/2008)  . Neuromuscular disorder     neuropathy  . Fibromyalgia   . Hepatitis A yrs ago  . CLL (chronic lymphoblastic leukemia) dx 2010    [facility] at [facility]13[facility], no current therapy     Review of Systems  Constitutional: Positive for fatigue. Negative for fever and unexpected weight change.  Respiratory: Negative for cough and shortness of breath.   Cardiovascular: Negative for chest pain, palpitations and leg swelling.  Musculoskeletal: Positive for myalgias (x 3 days).  Neurological: Positive for headaches (x 3 days).       Objective:   Physical Exam  BP 130/60 mmHg  Pulse 63  Temp(Src) 97.9 F (36.6 C) (Oral)  Ht 5\' 9"  (1.753 m)  Wt 176 lb (79.833 kg)  BMI 25.98 kg/m2  SpO2 97% Wt Readings from Last 3 Encounters:  04/27/14 176 lb (79.833 kg)  03/02/14 174 lb (78.926 kg)  02/09/14 170 lb  (77.111 kg)    Constitutional: he appears well-developed and well-nourished. No distress. min hoarse HENT: NCAT, no sinus pressure or drainage - OP min erythema, cobble stone without exudate - TMs clear Neck: Normal range of motion. Neck supple. No JVD present. No thyromegaly present.  Cardiovascular: Normal rate, regular rhythm and normal heart sounds.  No murmur heard. No BLE edema. Pulmonary/Chest: Effort normal and breath sounds normal. No respiratory distress. he has no wheezes.  Psychiatric: he has a normal mood and affect. His behavior is normal. Judgment and thought content normal.   Lab Results  Component Value Date   WBC 6.5 02/09/2014   HGB 13.9 02/09/2014   HCT 40.2 02/09/2014   PLT 122* 02/09/2014   GLUCOSE 73 02/16/2014   CHOL 110 02/16/2014   TRIG 108 02/16/2014   HDL 31* 02/16/2014   LDLCALC 57 02/16/2014   ALT 19 02/16/2014   AST 25 02/16/2014   NA 138 02/16/2014   K 3.7 02/16/2014   CL 103 02/16/2014   CREATININE 0.75 02/16/2014   BUN 21 02/16/2014   CO2 27 02/16/2014   TSH 1.25 07/08/2013   INR 2.8 04/03/2014   HGBA1C 5.1 11/02/2013  No results found.     Assessment & Plan:    URI - explained lack of efficacy of antibiotics in viral disease - symptomatic care recommended: antitussive and nasal decongestants/steroids for sinusitis; hydration and rest with tylenol or ibuprofen as needed for headache and myalgia symptoms  - pt to call if symptoms worse or unimproved

## 2014-04-27 NOTE — Patient Instructions (Addendum)
It was good to see you today.  If you develop worsening symptoms or fever, call and we can reconsider antibiotics, but it does not appear necessary to use antibiotics at this time.  Alternate between ibuprofen and tylenol for aches, pain and fever symptoms as discussed  Hydrate, rest and call if worse or unimproved  Please schedule followup in 3-4 months, call sooner if problems.  Upper Respiratory Infection, Adult An upper respiratory infection (URI) is also sometimes known as the common cold. The upper respiratory tract includes the nose, sinuses, throat, trachea, and bronchi. Bronchi are the airways leading to the lungs. Most people improve within 1 week, but symptoms can last up to 2 weeks. A residual cough may last even longer.  CAUSES Many different viruses can infect the tissues lining the upper respiratory tract. The tissues become irritated and inflamed and often become very moist. Mucus production is also common. A cold is contagious. You can easily spread the virus to others by oral contact. This includes kissing, sharing a glass, coughing, or sneezing. Touching your mouth or nose and then touching a surface, which is then touched by another person, can also spread the virus. SYMPTOMS  Symptoms typically develop 1 to 3 days after you come in contact with a cold virus. Symptoms vary from person to person. They may include:  Runny nose.  Sneezing.  Nasal congestion.  Sinus irritation.  Sore throat.  Loss of voice (laryngitis).  Cough.  Fatigue.  Muscle aches.  Loss of appetite.  Headache.  Low-grade fever. DIAGNOSIS  You might diagnose your own cold based on familiar symptoms, since most people get a cold 2 to 3 times a year. Your caregiver can confirm this based on your exam. Most importantly, your caregiver can check that your symptoms are not due to another disease such as strep throat, sinusitis, pneumonia, asthma, or epiglottitis. Blood tests, throat tests, and  X-rays are not necessary to diagnose a common cold, but they may sometimes be helpful in excluding other more serious diseases. Your caregiver will decide if any further tests are required. RISKS AND COMPLICATIONS  You may be at risk for a more severe case of the common cold if you smoke cigarettes, have chronic heart disease (such as heart failure) or lung disease (such as asthma), or if you have a weakened immune system. The very young and very old are also at risk for more serious infections. Bacterial sinusitis, middle ear infections, and bacterial pneumonia can complicate the common cold. The common cold can worsen asthma and chronic obstructive pulmonary disease (COPD). Sometimes, these complications can require emergency medical care and may be life-threatening. PREVENTION  The best way to protect against getting a cold is to practice good hygiene. Avoid oral or hand contact with people with cold symptoms. Wash your hands often if contact occurs. There is no clear evidence that vitamin C, vitamin E, echinacea, or exercise reduces the chance of developing a cold. However, it is always recommended to get plenty of rest and practice good nutrition. TREATMENT  Treatment is directed at relieving symptoms. There is no cure. Antibiotics are not effective, because the infection is caused by a virus, not by bacteria. Treatment may include:  Increased fluid intake. Sports drinks offer valuable electrolytes, sugars, and fluids.  Breathing heated mist or steam (vaporizer or shower).  Eating chicken soup or other clear broths, and maintaining good nutrition.  Getting plenty of rest.  Using gargles or lozenges for comfort.  Controlling fevers with ibuprofen  or acetaminophen as directed by your caregiver.  Increasing usage of your inhaler if you have asthma. Zinc gel and zinc lozenges, taken in the first 24 hours of the common cold, can shorten the duration and lessen the severity of symptoms. Pain  medicines may help with fever, muscle aches, and throat pain. A variety of non-prescription medicines are available to treat congestion and runny nose. Your caregiver can make recommendations and may suggest nasal or lung inhalers for other symptoms.  HOME CARE INSTRUCTIONS   Only take over-the-counter or prescription medicines for pain, discomfort, or fever as directed by your caregiver.  Use a warm mist humidifier or inhale steam from a shower to increase air moisture. This may keep secretions moist and make it easier to breathe.  Drink enough water and fluids to keep your urine clear or pale yellow.  Rest as needed.  Return to work when your temperature has returned to normal or as your caregiver advises. You may need to stay home longer to avoid infecting others. You can also use a face mask and careful hand washing to prevent spread of the virus. SEEK MEDICAL CARE IF:   After the first few days, you feel you are getting worse rather than better.  You need your caregiver's advice about medicines to control symptoms.  You develop chills, worsening shortness of breath, or brown or red sputum. These may be signs of pneumonia.  You develop yellow or brown nasal discharge or pain in the face, especially when you bend forward. These may be signs of sinusitis.  You develop a fever, swollen neck glands, pain with swallowing, or white areas in the back of your throat. These may be signs of strep throat. SEEK IMMEDIATE MEDICAL CARE IF:   You have a fever.  You develop severe or persistent headache, ear pain, sinus pain, or chest pain.  You develop wheezing, a prolonged cough, cough up blood, or have a change in your usual mucus (if you have chronic lung disease).  You develop sore muscles or a stiff neck. Document Released: 10/22/2000 Document Revised: 07/21/2011 Document Reviewed: 08/03/2013 [facility] Patient Information 2015 [place], [place]. This information is not intended to replace  advice given to you by your health care provider. Make sure you discuss any questions you have with your health care provider.

## 2014-05-01 ENCOUNTER — Ambulatory Visit: Payer: Self-pay | Admitting: Certified Registered Nurse Anesthetist

## 2014-05-01 DIAGNOSIS — I82409 Acute embolism and thrombosis of unspecified deep veins of unspecified lower extremity: Secondary | ICD-10-CM

## 2014-05-01 LAB — POCT INR: INR: 3

## 2014-05-01 NOTE — Patient Instructions (Signed)
Attempted to reach patient to discuss INR of 3.0.

## 2014-05-05 ENCOUNTER — Other Ambulatory Visit (HOSPITAL_COMMUNITY): Payer: Self-pay | Admitting: Psychiatry

## 2014-05-11 DIAGNOSIS — M0589 Other rheumatoid arthritis with rheumatoid factor of multiple sites: Secondary | ICD-10-CM | POA: Diagnosis not present

## 2014-05-15 ENCOUNTER — Telehealth (HOSPITAL_COMMUNITY): Payer: Self-pay | Admitting: *Deleted

## 2014-05-15 NOTE — Telephone Encounter (Signed)
Pt pharmacy requesting refills for pt Escitalopram 10mg . Per pt chart, pt have not been seen since 07-28-13. Called pt to sch f/u apptat 9:06am 05-15-14 and per pt his PCP Dr. Asa Lente have agreed to fill his mental health medications and do not think he will need to make f/u appt. Informed pt that I will inform provider with this information.  Pt agreed

## 2014-05-16 NOTE — Telephone Encounter (Signed)
noted 

## 2014-05-23 ENCOUNTER — Ambulatory Visit (INDEPENDENT_AMBULATORY_CARE_PROVIDER_SITE_OTHER): Payer: Medicare Other | Admitting: Family Medicine

## 2014-05-23 DIAGNOSIS — I82409 Acute embolism and thrombosis of unspecified deep veins of unspecified lower extremity: Secondary | ICD-10-CM

## 2014-05-23 LAB — POCT INR: INR: 2.6

## 2014-05-26 ENCOUNTER — Telehealth: Payer: Self-pay | Admitting: Internal Medicine

## 2014-05-26 ENCOUNTER — Other Ambulatory Visit: Payer: Self-pay | Admitting: Internal Medicine

## 2014-05-26 MED ORDER — ESCITALOPRAM OXALATE 10 MG PO TABS: 10.0000 mg | ORAL_TABLET | Freq: Every day | ORAL | Status: DC

## 2014-05-26 NOTE — Telephone Encounter (Signed)
erx done

## 2014-05-26 NOTE — Telephone Encounter (Signed)
    escitalopram (LEXAPRO) 10 MG tablet [[id number]]    Patient requesting a refill of the RX. He states that dr Asa Lente agreed to take over prescribing this.

## 2014-06-01 ENCOUNTER — Ambulatory Visit (HOSPITAL_BASED_OUTPATIENT_CLINIC_OR_DEPARTMENT_OTHER): Payer: Medicare Other | Admitting: Oncology

## 2014-06-01 ENCOUNTER — Telehealth: Payer: Self-pay | Admitting: Oncology

## 2014-06-01 ENCOUNTER — Other Ambulatory Visit (HOSPITAL_BASED_OUTPATIENT_CLINIC_OR_DEPARTMENT_OTHER): Payer: Medicare Other

## 2014-06-01 VITALS — BP 129/71 | HR 18 | Resp 98 | Ht 69.0 in | Wt 180.0 lb

## 2014-06-01 DIAGNOSIS — D7589 Other specified diseases of blood and blood-forming organs: Secondary | ICD-10-CM | POA: Diagnosis not present

## 2014-06-01 DIAGNOSIS — C9110 Chronic lymphocytic leukemia of B-cell type not having achieved remission: Secondary | ICD-10-CM

## 2014-06-01 DIAGNOSIS — C9190 Lymphoid leukemia, unspecified not having achieved remission: Secondary | ICD-10-CM

## 2014-06-01 DIAGNOSIS — B20 Human immunodeficiency virus [HIV] disease: Secondary | ICD-10-CM

## 2014-06-01 LAB — CBC WITH DIFFERENTIAL/PLATELET
BASO%: 0.2 % (ref 0.0–2.0)
Basophils Absolute: 0 10*3/uL (ref 0.0–0.1)
EOS%: 1.3 % (ref 0.0–7.0)
Eosinophils Absolute: 0.1 10*3/uL (ref 0.0–0.5)
HCT: 42.1 % (ref 38.4–49.9)
HGB: 14.4 g/dL (ref 13.0–17.1)
LYMPH%: 64.3 % — ABNORMAL HIGH (ref 14.0–49.0)
MCH: 40.3 pg — ABNORMAL HIGH (ref 27.2–33.4)
MCHC: 34.2 g/dL (ref 32.0–36.0)
MCV: 117.9 fL — ABNORMAL HIGH (ref 79.3–98.0)
MONO#: 1.3 10*3/uL — ABNORMAL HIGH (ref 0.1–0.9)
MONO%: 12.9 % (ref 0.0–14.0)
NEUT#: 2.2 10*3/uL (ref 1.5–6.5)
NEUT%: 21.3 % — ABNORMAL LOW (ref 39.0–75.0)
Platelets: 141 10*3/uL (ref 140–400)
RBC: 3.57 10*6/uL — ABNORMAL LOW (ref 4.20–5.82)
RDW: 14.1 % (ref 11.0–14.6)
WBC: 10.2 10*3/uL (ref 4.0–10.3)
lymph#: 6.5 10*3/uL — ABNORMAL HIGH (ref 0.9–3.3)

## 2014-06-01 LAB — COMPREHENSIVE METABOLIC PANEL (CC13)
ALT: 19 U/L (ref 0–55)
AST: 23 U/L (ref 5–34)
Albumin: 3.8 g/dL (ref 3.5–5.0)
Alkaline Phosphatase: 86 U/L (ref 40–150)
Anion Gap: 7 mEq/L (ref 3–11)
BUN: 26.4 mg/dL — ABNORMAL HIGH (ref 7.0–26.0)
CO2: 28 mEq/L (ref 22–29)
Calcium: 8.6 mg/dL (ref 8.4–10.4)
Chloride: 102 mEq/L (ref 98–109)
Creatinine: 0.8 mg/dL (ref 0.7–1.3)
EGFR: >90 mL/min/{1.73_m2} (ref >90)
Glucose: 86 mg/dl (ref 70–140)
Potassium: 4.5 mEq/L (ref 3.5–5.1)
Sodium: 138 mEq/L (ref 136–145)
Total Bilirubin: 0.62 mg/dL (ref 0.20–1.20)
Total Protein: 6 g/dL — ABNORMAL LOW (ref 6.4–8.3)

## 2014-06-01 LAB — TECHNOLOGIST REVIEW

## 2014-06-01 NOTE — Progress Notes (Signed)
Hematology and Oncology Follow Up Visit  GRANVIL DJORDJEVIC [id number] Apr 01, 1951 64 y.o. 06/01/2014 10:26 AM [name], MDLeschber, [name], MD   Principle Diagnosis: 64 year old gentleman with CLL diagnosed in 2007 at [facility].  Current therapy: Observation and surveillance.  Interim History:  Mr. Baby presents today for a followup visit.  Since his last visit, he has not reported any new complaints. He has not reported any symptoms and continue to be completely asymptomatic from his CLL. He has not reported any recent hospitalization and the last ammonia he had was in June 2015. He has not reported any fevers or chills or sweats. Has not reported any cough or hemoptysis or hematemesis. Has not reported any weight loss her early satiety. He does not report any headaches or blurry vision or double vision. Does not report any syncope or seizures. Report any chest pain or shortness of breath. Does not report any cough or hemoptysis. His appetite and weight have been stable. Has not reported any change in his bowel habits such as constipation or diarrhea. He is not reporting any urinary symptoms. Rest of his review of systems unremarkable.  Medications: I have reviewed the patient's current medications.  Current Outpatient Prescriptions  Medication Sig Dispense Refill  . aspirin EC 81 MG tablet Take 81 mg by mouth daily.    [name] atorvastatin (LIPITOR) 10 MG tablet Take 1 tablet (10 mg total) by mouth at bedtime. 30 tablet 6  . Calcium Carb-Cholecalciferol (CALCIUM 1000 + D PO) Take 1 tablet by mouth daily.    . Darunavir Ethanolate (PREZISTA) 800 MG tablet Take 1 tablet (800 mg total) by mouth daily. 30 tablet 5  . EPINEPHrine (EPI-PEN) 0.3 mg/0.3 mL SOAJ Inject 0.3 mg into the muscle once. As needed for allergic reaction    . escitalopram (LEXAPRO) 10 MG tablet Take 1 tablet (10 mg total) by mouth daily. 90 tablet 1  . gabapentin (NEURONTIN) 300 MG capsule TAKE 1 CAPSULE  (300 MG TOTAL) BY MOUTH 4 (FOUR) TIMES DAILY. 120 capsule 5  . inFLIXimab (REMICADE) 100 MG injection Inject into the vein every 6 (six) weeks.     . ISENTRESS 400 MG tablet TAKE 1 TABLET BY MOUTH TWICE A DAY 60 tablet 3  . lamiVUDine-zidovudine (COMBIVIR) 150-300 MG per tablet Take 1 tablet by mouth 2 (two) times daily. 60 tablet 5  . loratadine (CLARITIN) 10 MG tablet Take 10 mg by mouth daily as needed.    . metFORMIN (GLUCOPHAGE XR) 500 MG 24 hr tablet Take 1 tablet (500 mg total) by mouth daily with breakfast. 90 tablet 3  . methocarbamol (ROBAXIN) 500 MG tablet Take 500 mg by mouth every 8 (eight) hours as needed for muscle spasms.    . metoprolol succinate (TOPROL-XL) 25 MG 24 hr tablet Take 1 tablet (25 mg total) by mouth at bedtime. 30 tablet 11  . mometasone (NASONEX) 50 MCG/ACT nasal spray Place 2 sprays into the nose daily as needed (for allergies).     . NORVIR 100 MG TABS tablet TAKE 1 TABLET BY MOUTH EVERY DAY 90 tablet 3  . ondansetron (ZOFRAN ODT) 4 MG disintegrating tablet 4mg  ODT q6 hours prn nausea/vomit 10 tablet 0  . pantoprazole (PROTONIX) 40 MG tablet Take 40 mg by mouth daily.    . raltegravir (ISENTRESS) 400 MG tablet Take 400 mg by mouth 2 (two) times daily.    . ritonavir (NORVIR) 100 MG capsule Take 100 mg by mouth daily with breakfast.    .  saccharomyces boulardii (FLORASTOR) 250 MG capsule Take 1 capsule (250 mg total) by mouth 2 (two) times daily. 60 capsule 3  . traMADol (ULTRAM) 50 MG tablet Take 50-100 mg by mouth every 6 (six) hours as needed (for pain).    . valACYclovir (VALTREX) 500 MG tablet TAKE 1 TABLET BY MOUTH EVERY DAY 90 tablet 3  . warfarin (COUMADIN) 10 MG tablet Take 0.5-1 tablets (5-10 mg total) by mouth daily. Takes 1/2 tab (5 mg) on Wednesday takes 1 tablet (10 mg) all other days 60 tablet 3  . zolpidem (AMBIEN) 5 MG tablet Take 1 tablet (5 mg total) by mouth at bedtime as needed for sleep. 30 tablet 0   No current facility-administered  medications for this visit.     Allergies:  Allergies  Allergen Reactions  . Morphine Other (See Comments)    REACTION: severe headache  . Other Anaphylaxis and Hives    Pecan  . Peanut-Containing Drug Products Anaphylaxis and Hives    Swelling of throat  . Penicillins Anaphylaxis and Rash    REACTION: red, flushed  . Oxycodone-Acetaminophen Other (See Comments)    REACTION: headache  . Promethazine Hcl Other (See Comments)    REACTION: makes him feel drunk    Past Medical History, Surgical history, Social history, and Family History were reviewed and updated.   Physical Exam: Blood pressure 129/71, pulse 18, resp. rate 98, height 5\' 9"  (1.753 m), weight 180 lb (81.647 kg). ECOG: 1 General appearance: alert awake not in any distress. Head: Normocephalic, without obvious abnormality Neck: no adenopathy Lymph nodes: Cervical, supraclavicular, and axillary nodes normal. Heart:regular rate and rhythm, S1, S2 normal, no murmur, click, rub or gallop Lung:chest clear, no wheezing, rales, normal symmetric air entry Abdomin: soft, non-tender, without masses or organomegaly EXT:no erythema, induration, or nodules   Lab Results: Lab Results  Component Value Date   WBC 10.2 06/01/2014   HGB 14.4 06/01/2014   HCT 42.1 06/01/2014   MCV 117.9* 06/01/2014   PLT 141 06/01/2014     Chemistry      Component Value Date/Time   NA 138 02/16/2014 1001   NA 136 11/24/2013 0942   K 3.7 02/16/2014 1001   K 4.5 11/24/2013 0942   CL 103 02/16/2014 1001   CO2 27 02/16/2014 1001   CO2 27 11/24/2013 0942   BUN 21 02/16/2014 1001   BUN 15.6 11/24/2013 0942   CREATININE 0.75 02/16/2014 1001   CREATININE 0.72 02/09/2014 1145   CREATININE 0.8 11/24/2013 0942      Component Value Date/Time   CALCIUM 8.2* 02/16/2014 1001   CALCIUM 8.9 11/24/2013 0942   ALKPHOS 77 02/16/2014 1001   ALKPHOS 77 11/24/2013 0942   AST 25 02/16/2014 1001   AST 36* 11/24/2013 0942   ALT 19 02/16/2014 1001    ALT 26 11/24/2013 0942   BILITOT 0.8 02/16/2014 1001   BILITOT 0.73 11/24/2013 0942       Impression and Plan:  64 year old gentleman with the following issues:  1. CLL diagnosed in 2007 presented with stage 0 CD38 positive, ZAP 70 positive. He did have some mild lymphadenopathy on CT scan in January 2015 that is mostly subcentimeter with a aortocaval lymph node largest measuring 2.0 x 1.6 cm. the time being we'll continue active surveillance and repeat laboratory testing in 6 months. We will repeat imaging studies as needed.  2. Macrocytosis: As undoubtedly related to his HIV medications I will continue to observe that no intervention is needed.  [facility], MD 1/21/201610:26 AM

## 2014-06-01 NOTE — Telephone Encounter (Signed)
Gave avs & calendar for July °

## 2014-06-10 ENCOUNTER — Other Ambulatory Visit: Payer: Self-pay | Admitting: Internal Medicine

## 2014-06-11 ENCOUNTER — Other Ambulatory Visit: Payer: Self-pay | Admitting: Internal Medicine

## 2014-06-13 ENCOUNTER — Telehealth: Payer: Self-pay | Admitting: Family

## 2014-06-13 ENCOUNTER — Ambulatory Visit (INDEPENDENT_AMBULATORY_CARE_PROVIDER_SITE_OTHER): Payer: Medicare Other | Admitting: General Practice

## 2014-06-13 LAB — POCT INR: INR: 4.1

## 2014-06-13 NOTE — Telephone Encounter (Signed)
Agree with plan 

## 2014-06-13 NOTE — Progress Notes (Signed)
Pre visit review using our clinic review tool, if applicable. No additional management support is needed unless otherwise documented below in the visit note. 

## 2014-06-15 ENCOUNTER — Other Ambulatory Visit: Payer: Medicare Other

## 2014-06-15 DIAGNOSIS — B20 Human immunodeficiency virus [HIV] disease: Secondary | ICD-10-CM | POA: Diagnosis not present

## 2014-06-15 DIAGNOSIS — M0589 Other rheumatoid arthritis with rheumatoid factor of multiple sites: Secondary | ICD-10-CM | POA: Diagnosis not present

## 2014-06-15 LAB — CBC WITH DIFFERENTIAL/PLATELET
Basophils Absolute: 0 10*3/uL (ref 0.0–0.1)
Basophils Relative: 0 % (ref 0–1)
Eosinophils Absolute: 0 10*3/uL (ref 0.0–0.7)
Eosinophils Relative: 0 % (ref 0–5)
HCT: 39.5 % (ref 39.0–52.0)
Hemoglobin: 13.7 g/dL (ref 13.0–17.0)
Lymphocytes Relative: 58 % — ABNORMAL HIGH (ref 12–46)
Lymphs Abs: 5.2 10*3/uL — ABNORMAL HIGH (ref 0.7–4.0)
MCH: 39.6 pg — ABNORMAL HIGH (ref 26.0–34.0)
MCHC: 34.7 g/dL (ref 30.0–36.0)
MCV: 114.2 fL — ABNORMAL HIGH (ref 78.0–100.0)
MPV: 8.8 fL (ref 8.6–12.4)
Monocytes Absolute: 1.3 10*3/uL — ABNORMAL HIGH (ref 0.1–1.0)
Monocytes Relative: 15 % — ABNORMAL HIGH (ref 3–12)
Neutro Abs: 2.4 10*3/uL (ref 1.7–7.7)
Neutrophils Relative %: 27 % — ABNORMAL LOW (ref 43–77)
Platelets: 164 10*3/uL (ref 150–400)
RBC: 3.46 MIL/uL — ABNORMAL LOW (ref 4.22–5.81)
RDW: 13.2 % (ref 11.5–15.5)
WBC: 8.9 10*3/uL (ref 4.0–10.5)

## 2014-06-15 LAB — COMPLETE METABOLIC PANEL WITH GFR
ALT: 26 U/L (ref 0–53)
AST: 28 U/L (ref 0–37)
Albumin: 3.6 g/dL (ref 3.5–5.2)
Alkaline Phosphatase: 81 U/L (ref 39–117)
BUN: 14 mg/dL (ref 6–23)
CO2: 25 mEq/L (ref 19–32)
Calcium: 8.4 mg/dL (ref 8.4–10.5)
Chloride: 98 mEq/L (ref 96–112)
Creat: 0.71 mg/dL (ref 0.50–1.35)
GFR, Est African American: >89 mL/min
GFR, Est Non African American: >89 mL/min
Glucose, Bld: 82 mg/dL (ref 70–99)
Potassium: 3.9 mEq/L (ref 3.5–5.3)
Sodium: 131 mEq/L — ABNORMAL LOW (ref 135–145)
Total Bilirubin: 1.2 mg/dL (ref 0.2–1.2)
Total Protein: 5.8 g/dL — ABNORMAL LOW (ref 6.0–8.3)

## 2014-06-16 ENCOUNTER — Other Ambulatory Visit: Payer: Self-pay | Admitting: Cardiology

## 2014-06-16 LAB — T-HELPER CELL (CD4) - (RCID CLINIC ONLY)
CD4 % Helper T Cell: 16 % — ABNORMAL LOW (ref 33–55)
CD4 T Cell Abs: 800 /uL (ref 400–2700)

## 2014-06-16 LAB — HIV-1 RNA QUANT-NO REFLEX-BLD
HIV 1 RNA Quant: <20 copies/mL (ref <20)
HIV-1 RNA Quant, Log: <1.3 {Log} (ref <1.30)

## 2014-06-22 ENCOUNTER — Other Ambulatory Visit: Payer: Self-pay

## 2014-07-01 ENCOUNTER — Other Ambulatory Visit: Payer: Self-pay | Admitting: Internal Medicine

## 2014-07-06 ENCOUNTER — Ambulatory Visit: Payer: Self-pay | Admitting: Internal Medicine

## 2014-07-07 ENCOUNTER — Other Ambulatory Visit: Payer: Self-pay | Admitting: Internal Medicine

## 2014-07-11 ENCOUNTER — Encounter: Payer: Self-pay | Admitting: Internal Medicine

## 2014-07-11 ENCOUNTER — Other Ambulatory Visit: Payer: Self-pay | Admitting: *Deleted

## 2014-07-11 ENCOUNTER — Other Ambulatory Visit (INDEPENDENT_AMBULATORY_CARE_PROVIDER_SITE_OTHER): Payer: Medicare Other

## 2014-07-11 ENCOUNTER — Ambulatory Visit (INDEPENDENT_AMBULATORY_CARE_PROVIDER_SITE_OTHER): Payer: Medicare Other | Admitting: Internal Medicine

## 2014-07-11 VITALS — BP 112/62 | HR 81 | Temp 98.1°F | Resp 16 | Wt 174.0 lb

## 2014-07-11 VITALS — BP 136/82 | HR 76 | Temp 98.2°F | Wt 174.0 lb

## 2014-07-11 DIAGNOSIS — R5382 Chronic fatigue, unspecified: Secondary | ICD-10-CM

## 2014-07-11 DIAGNOSIS — T3995XA Adverse effect of unspecified nonopioid analgesic, antipyretic and antirheumatic, initial encounter: Secondary | ICD-10-CM

## 2014-07-11 DIAGNOSIS — B20 Human immunodeficiency virus [HIV] disease: Secondary | ICD-10-CM | POA: Diagnosis not present

## 2014-07-11 DIAGNOSIS — E1142 Type 2 diabetes mellitus with diabetic polyneuropathy: Secondary | ICD-10-CM | POA: Diagnosis not present

## 2014-07-11 DIAGNOSIS — B351 Tinea unguium: Secondary | ICD-10-CM | POA: Diagnosis not present

## 2014-07-11 DIAGNOSIS — G4440 Drug-induced headache, not elsewhere classified, not intractable: Secondary | ICD-10-CM | POA: Diagnosis not present

## 2014-07-11 DIAGNOSIS — G609 Hereditary and idiopathic neuropathy, unspecified: Secondary | ICD-10-CM | POA: Diagnosis not present

## 2014-07-11 DIAGNOSIS — L84 Corns and callosities: Secondary | ICD-10-CM | POA: Diagnosis not present

## 2014-07-11 LAB — CBC
HCT: 37.4 % — ABNORMAL LOW (ref 39.0–52.0)
Hemoglobin: 12.7 g/dL — ABNORMAL LOW (ref 13.0–17.0)
MCHC: 33.9 g/dL (ref 30.0–36.0)
MCV: 115.7 fl — ABNORMAL HIGH (ref 78.0–100.0)
Platelets: 239 10*3/uL (ref 150.0–400.0)
RBC: 3.23 Mil/uL — ABNORMAL LOW (ref 4.22–5.81)
RDW: 13.4 % (ref 11.5–15.5)
WBC: 8.2 10*3/uL (ref 4.0–10.5)

## 2014-07-11 LAB — TSH: TSH: 0.6 u[IU]/mL (ref 0.35–4.50)

## 2014-07-11 LAB — TESTOSTERONE: Testosterone: 217.93 ng/dL — ABNORMAL LOW (ref 300.00–890.00)

## 2014-07-11 MED ORDER — RALTEGRAVIR POTASSIUM 400 MG PO TABS: 400.0000 mg | ORAL_TABLET | Freq: Two times a day (BID) | ORAL | Status: DC

## 2014-07-11 MED ORDER — PREGABALIN 200 MG PO CAPS: 200.0000 mg | ORAL_CAPSULE | Freq: Two times a day (BID) | ORAL | Status: DC

## 2014-07-11 MED ORDER — ELVITEG-COBIC-EMTRICIT-TENOFAF 150-150-200-10 MG PO TABS: 1.0000 | ORAL_TABLET | Freq: Every day | ORAL | Status: DC

## 2014-07-11 MED ORDER — ONDANSETRON 4 MG PO TBDP: ORAL_TABLET | ORAL | Status: DC

## 2014-07-11 NOTE — Progress Notes (Signed)
  Subjective:    Patient ID: JAELEN SOTH, male    DOB: 03-Jul-1950, 64 y.o.   MRN: [id number]  HPI He comes in for routine follow up.  He continues on Prezista with norvir, Combivir and Isentress. He has had a lot of trouble with peripheral neuropathy and I suspect Combivir is a significant cause.   He feels well on remicaide for RA.  No missed doses.   We looked into getting the [facility] test covered however it is not covered to see if he has some underlying resistance.   Review of Systems  Constitutional: Negative for fever and fatigue.  HENT: Negative for sore throat and trouble swallowing.   Eyes: Negative for visual disturbance.  Respiratory: Negative for shortness of breath.   Gastrointestinal: Negative for nausea, abdominal pain, diarrhea and abdominal distention.  Musculoskeletal: Positive for arthralgias.       Much improved on remicaide  Skin: Negative for rash.  Neurological: Negative for dizziness and headaches.  Hematological: Negative for adenopathy.  Psychiatric/Behavioral: Negative for dysphoric mood.       Objective:   Physical Exam  Constitutional: He is oriented to person, place, and time. He appears well-developed.  HENT:  Mouth/Throat: No oropharyngeal exudate.  Eyes: Right eye exhibits no discharge. Left eye exhibits no discharge. No scleral icterus.  Cardiovascular: Normal rate, regular rhythm and normal heart sounds.   No murmur heard. Pulmonary/Chest: Effort normal and breath sounds normal. No respiratory distress. He has no wheezes.  Musculoskeletal:  Spinal deformity Changes of RA in hands  Lymphadenopathy:    He has no cervical adenopathy.  Neurological: He is alert and oriented to person, place, and time.  Skin: Skin is warm and dry. No rash noted.  Psychiatric: He has a normal mood and affect. His behavior is normal.          Assessment & Plan:

## 2014-07-11 NOTE — Progress Notes (Signed)
HPI: [name] is a 64 y.o. male who presents to the [facility] for evaluation of his antiretroviral therapy.   Allergies: Allergies  Allergen Reactions  . Morphine Other (See Comments)    REACTION: severe headache  . Other Anaphylaxis and Hives    Pecan  . Peanut-Containing Drug Products Anaphylaxis and Hives    Swelling of throat  . Penicillins Anaphylaxis and Rash    REACTION: red, flushed  . Oxycodone-Acetaminophen Other (See Comments)    REACTION: headache  . Promethazine Hcl Other (See Comments)    REACTION: makes him feel drunk    Vitals: Temp: 98.2 F (36.8 C) (03/01 1015) Temp Source: Oral (03/01 1015) BP: 136/82 mmHg (03/01 1015) Pulse Rate: 76 (03/01 1015)  Past Medical History: Past Medical History  Diagnosis Date  . Osteoarthritis, knee     s/p B TKA  . HIV infection dx 1993  . TIA (transient ischemic attack) 1997    mild residual L mouth droop  . H/O hiatal hernia 2008    surgery  . Gynecomastia, male   . Impotence of organic origin   . Rheumatoid arthritis(714.0) dx 2010    MTX, follows with rheum  . Gout   . Chronic back pain     follows with Nsurg  . Coronary artery disease 2010    s/p CABG '10, sees Dr. Percival Spanish  . Seasonal allergies   . Diverticulosis   . Gallstones   . Status post dilation of esophageal narrowing   . GERD (gastroesophageal reflux disease)   . Diverticulosis   . Esophagitis   . Hemorrhoids   . Tubular adenoma of colon   . Myocardial infarction 2010     x 2  . Hypertension   . DVT, lower extremity, recurrent 2008, 2009    LLE, chronic anticoag since 2009  . Carotid artery occlusion     40-60% right ICA stenosis (09/2008)  . Neuromuscular disorder     neuropathy  . Fibromyalgia   . Hepatitis A yrs ago  . CLL (chronic lymphoblastic leukemia) dx 2010    [facility] at [facility], no current therapy     Social History: History   Social History  . Marital Status: Widowed    Spouse Name: N/A  . Number of Children: 3   . Years of Education: N/A   Occupational History  . retired    Social History Main Topics  . Smoking status: Never Smoker   . Smokeless tobacco: Never Used     Comment: occ wine  . Alcohol Use: Yes     Comment: occasional wine- 1-2 per week  . Drug Use: No  . Sexual Activity: No     Comment: pt. given condoms   Other Topics Concern  . None   Social History Narrative    Current Regimen: Norvir + Combivir + Isentress + Prezista  Labs: HIV 1 RNA QUANT (copies/mL)  Date Value  06/15/2014 <20  02/16/2014 23*  10/13/2013 <20   CD4 T CELL ABS (/uL)  Date Value  06/15/2014 800  02/16/2014 800  10/13/2013 670   HEP B S AB (no units)  Date Value  03/20/2011 NEG   HCV AB (no units)  Date Value  03/20/2011 NEGATIVE    CrCl: CrCl cannot be calculated (Patient has no serum creatinine result on file.).  Lipids:    Component Value Date/Time   CHOL 110 02/16/2014 1001   TRIG 108 02/16/2014 1001   HDL 31* 02/16/2014 1001  CHOLHDL 3.5 02/16/2014 1001   VLDL 22 02/16/2014 1001   LDLCALC 57 02/16/2014 1001    Assessment: This is a 64 yo m who presents to the [facility] for evaluation of his current ART. He is currently experiencing severe peripheral neuropathy on his regimen of Norvir + Combivir + Isentress + Prezista.  Discussed options with the patient.  Will switch patient to [name] for decrease pill burden and to help with current side effects. Also discussed patient's peripheral neuropathy and have decided to try Lyrica again (he is currently taking Neurontin). Discussed that if Lyrica is not covered by his insurance, we could possibly go to Neurontin + amitriptyline.   Recommendations: We will try [place] + Prezista. Will also discontinue Neurontin and retry Lyrica.  Horatio Pel, PharmD Clinical Infectious Disease [facility] 07/11/2014, 11:31 AM

## 2014-07-11 NOTE — Progress Notes (Signed)
Pre visit review using our clinic review tool, if applicable. No additional management support is needed unless otherwise documented below in the visit note. 

## 2014-07-11 NOTE — Assessment & Plan Note (Signed)
Unfortunately, we are unable to get the [facility] test however will try a change his regimen to hopefully improve his peripheral neuropathy or at least keep it from progressing worse if it is truly due to Combivir.  Will just need to follow his labs closely to see if there is any resistance that is unmasked. I discussed the different regimens and with his bone density issues and his peripheral neuropathy, also concern for any kidney issues, I will change him to [facility] along with Prezista. Hopefully this will have good suppression of his virus but will need to follow closely. He will return in 3 weeks for labs and I will see him 1 month after that.

## 2014-07-11 NOTE — Assessment & Plan Note (Signed)
He continues to struggle with this and has gotten worse with little improvement with Neurontin.  He did have a much better effect with Lyrica. Besides changing him off of Combivir, I will try back on Lyrica and do prior authorization if needed.

## 2014-07-11 NOTE — Patient Instructions (Signed)
We are checking some blood work to see if we can find a cause for the fatigue. We will call you back with the results.  You can keep using the hydrocortisone for the rash and if it does not go away call us back.   Consider using massage or heat on your neck to see if it helps with the headache. The other option is that taking medicine for headache can cause the headache to return without the medicine.     Analgesic Rebound Headaches An analgesic rebound headache is a headache that returns after pain medicine (analgesic) that was taken to treat the initial headache wears off. People who suffer from tension, migraine, or cluster headaches are at risk for developing rebound headaches. Any type of primary headache can return as a rebound headache if you regularly take analgesics more than three times a week. If the cycle of rebound headaches continues, they become chronic daily headaches.  CAUSES Analgesics frequently associated with this problem include common over-the-counter medicines like aspirin, ibuprofen, acetaminophen, sinus relief medicines, and other medicines that contain caffeine. Narcotic pain medicines are also a common cause of rebound headaches.  SIGNS AND SYMPTOMS The symptoms of rebound headaches are the same as the symptoms of your initial headache. Symptoms of specific types of headaches include: Tension headache  Pressure around the head.  Dull, aching head pain.  Pain felt over the front and sides of the head.  Tenderness in the muscles of the head, neck and shoulders. Migraine Headache  Pulsing or throbbing pain on one or both sides of the head.  Severe pain that interferes with daily activities.  Pain that is worsened by physical activity.  Nausea, vomiting, or both.  Pain with exposure to bright light, loud noises, or strong smells.  General sensitivity to bright light, loud noises, or strong smells.  Visual changes.  Numbness of one or both arms. Cluster  Headaches  Severe pain that begins in or around one eye or temple.  Redness in the eye on the same side as the pain.  Droopy or swollen eyelid.  One-sided head pain.  Nausea.  Runny nose.  Sweaty, pale facial skin.  Restlessness. DIAGNOSIS  Analgesic rebound headaches are diagnosed by reviewing your medical history. This includes the nature of your initial headaches, as well as the type of pain medicines you have been using to treat your headaches and how often you take them. TREATMENT Discontinuing frequent use of the analgesic medicine will typically reduce the frequency of the rebound episodes. This may initially worsen your headaches but eventually the pain should become more manageable, less frequent, and less severe.  Seeing a headache specialists may helpful. He or she may be able to help you manage your headaches and to make sure there is not another cause of the headaches. Alternative methods of stress relief such as acupuncture, counseling, biofeedback, and massage may also be helpful. Talk with your health care provider about which alternative treatments might be good for you. HOME CARE INSTRUCTIONS Stopping the regular use of pain medicine can be difficult. Follow your health care provider's instructions carefully. Keep all of your appointments. Avoid triggers that are known to cause your primary headaches. SEEK MEDICAL CARE IF: You continue to experience headaches after following your health care provider's recommended treatments. SEEK IMMEDIATE MEDICAL CARE IF:  You develop new headache pain.  You develop headache pain that is different than what you have experienced in the past.  You develop numbness or tingling in  your arms or legs.  You develop changes in your speech or vision. MAKE SURE YOU:  Understand these instructions.  Will watch your child's condition.  Will get help right away if your child is not doing well or gets worse. Document Released:  07/19/2003 Document Revised: 09/12/2013 Document Reviewed: 11/11/2012 [facility] Patient Information 2015 [place], [place]. This information is not intended to replace advice given to you by your health care provider. Make sure you discuss any questions you have with your health care provider.

## 2014-07-13 ENCOUNTER — Encounter: Payer: Self-pay | Admitting: Internal Medicine

## 2014-07-13 DIAGNOSIS — G4440 Drug-induced headache, not elsewhere classified, not intractable: Secondary | ICD-10-CM | POA: Insufficient documentation

## 2014-07-13 DIAGNOSIS — T3995XA Adverse effect of unspecified nonopioid analgesic, antipyretic and antirheumatic, initial encounter: Secondary | ICD-10-CM

## 2014-07-13 DIAGNOSIS — R5383 Other fatigue: Secondary | ICD-10-CM | POA: Insufficient documentation

## 2014-07-13 LAB — TESTOSTERONE, FREE, TOTAL, SHBG
Sex Hormone Binding: 22 nmol/L (ref 22–77)
Testosterone, Free: 50.5 pg/mL (ref 47.0–244.0)
Testosterone-% Free: 2.4 % (ref 1.6–2.9)
Testosterone: 213 ng/dL — ABNORMAL LOW (ref 300–890)

## 2014-07-13 NOTE — Assessment & Plan Note (Signed)
Check CBC, TSH, testosterone today to see if we can find a cause for the fatigue. Talked to him about the fact that HIV itself can cause chronic problems although his is well controlled. Likely the etiology is complex and multifaceted. We are not likely to solve it with one intervention. See if changing HAART regimen will help. Advised exercise if he is not as a way to boost his energy. Unclear if there is underlying depression although did not seem most likely during our conversation today.

## 2014-07-13 NOTE — Progress Notes (Signed)
   Subjective:    Patient ID: [name], male    DOB: 1951-01-07, 64 y.o.   MRN: [id number]  HPI The patient is a 64 YO man coming in with multiple acute complaints. Unfortunately we are unable to address all of them but his largest complaint is fatigue. He has been fatigued for many years. Nothing makes it better or worse. He has not really tried anything for it. He has complicating medical history of HIV (well controlled on HAART). His regimen is about to be changed. He also has complicating history of fibromyalgia although he is not sure if this has been treated and denies current active pain. He also has rash which is chronic and not related to soaps, allergens that he is aware of. He is also having daily headaches for several weeks and has been using OTC pain relief over the counter at least daily during that time. He woke up with a headache this morning. At this time he has more complaints but we are unable to address them.   PMH, allergies, medications reviewed and updated.   Review of Systems  Constitutional: Positive for fatigue. Negative for fever, activity change, appetite change and unexpected weight change.  HENT: Negative.   Respiratory: Negative for cough.   Cardiovascular: Negative for chest pain and palpitations.  Gastrointestinal: Negative for diarrhea, constipation and blood in stool.  Musculoskeletal: Positive for back pain and arthralgias. Negative for myalgias.  Skin: Positive for color change and rash.  Neurological: Negative for dizziness, weakness and numbness.      Objective:   Physical Exam  Constitutional: He is oriented to person, place, and time. He appears well-developed and well-nourished.  HENT:  Head: Normocephalic and atraumatic.  Cardiovascular: Normal rate and regular rhythm.   Pulmonary/Chest: Effort normal and breath sounds normal.  Abdominal: Soft.  Lymphadenopathy:    He has no cervical adenopathy.  Neurological: He is alert and oriented to  person, place, and time.  Skin: Skin is warm and dry.  Small red bumps on the forearms bilaterally.   Filed Vitals:   07/11/14 1327  BP: 112/62  Pulse: 81  Temp: 98.1 F (36.7 C)  TempSrc: Oral  Resp: 16  Weight: 174 lb (78.926 kg)  SpO2: 98%      Assessment & Plan:

## 2014-07-13 NOTE — Assessment & Plan Note (Signed)
Has been using daily medication for headaches and talked to him about the fact that this can in fact lead to more headaches. He will try massage or heat on his head and neck for relief and save analgesics for the worst headaches to see if this helps them to disappear.

## 2014-07-14 LAB — POCT INR: INR: 3.1

## 2014-07-17 ENCOUNTER — Telehealth: Payer: Self-pay | Admitting: Cardiology

## 2014-07-17 ENCOUNTER — Ambulatory Visit (INDEPENDENT_AMBULATORY_CARE_PROVIDER_SITE_OTHER): Payer: Medicare Other | Admitting: General Practice

## 2014-07-17 NOTE — Telephone Encounter (Signed)
I scheduled the patient to be seen on March 31st in the [facility] office.  Patient is willing to travel to any location Dr. Percival Spanish is at this week to get in sooner with him.  Please call patient with update.

## 2014-07-17 NOTE — Telephone Encounter (Signed)
Dr Percival Spanish is not in the office anywhere until 3/14 [facility] office) as he is scheduled for administration work until then.  If pt is having a problem and needs to be seen prior to scheduled appt he will need to call the office to discuss.

## 2014-07-17 NOTE — Telephone Encounter (Signed)
Pt made aware of appt. Pt states he is seeing [facility] tomorrow to f/u on symptoms as well. Pt states that if symptoms get worse he will be evaluated at the ED. Pt thanked [place] for f/u with him

## 2014-07-17 NOTE — Progress Notes (Signed)
Pre visit review using our clinic review tool, if applicable. No additional management support is needed unless otherwise documented below in the visit note. 

## 2014-07-18 ENCOUNTER — Encounter: Payer: Self-pay | Admitting: Internal Medicine

## 2014-07-18 ENCOUNTER — Ambulatory Visit (INDEPENDENT_AMBULATORY_CARE_PROVIDER_SITE_OTHER): Payer: Medicare Other | Admitting: Internal Medicine

## 2014-07-18 ENCOUNTER — Telehealth: Payer: Self-pay | Admitting: Cardiology

## 2014-07-18 VITALS — BP 114/66 | HR 67 | Temp 98.4°F | Resp 16 | Wt 175.0 lb

## 2014-07-18 DIAGNOSIS — I25118 Atherosclerotic heart disease of native coronary artery with other forms of angina pectoris: Secondary | ICD-10-CM

## 2014-07-18 DIAGNOSIS — R0600 Dyspnea, unspecified: Secondary | ICD-10-CM | POA: Diagnosis not present

## 2014-07-18 DIAGNOSIS — R21 Rash and other nonspecific skin eruption: Secondary | ICD-10-CM | POA: Diagnosis not present

## 2014-07-18 NOTE — Telephone Encounter (Signed)
I spoke with patient.  He has been experiencing SOB x 1 month.  It worsens with exertion and is described as a burning sensation when he takes a deep breath in.  He also complains of fatigue with has been occuring x 2 months (he has been seeing his PCP for this reason).  [name] denies palpitations.  I offered to work patient in this week with another MD or he could see Dr Percival Spanish on Monday.  He decided to wait and see Dr Percival Spanish.  He will go to ER if symptoms worsen.

## 2014-07-18 NOTE — Assessment & Plan Note (Signed)
Having new symptoms of tiredness and chest tightness with breathing problems. With his HIV he is high for cardiac disease. Will move up his cardiology appointment and order 2D echo.

## 2014-07-18 NOTE — Telephone Encounter (Signed)
[name] from Dr. Doug Sou @ [facility] office is calling to about Mr. [name]  He is having shortness of breath. Please call the patient at [phone]. Thanks

## 2014-07-18 NOTE — Assessment & Plan Note (Signed)
Could be many etiology. He is going to see dermatology on Monday so likely needs biopsy for more definitive answer. Will hold off on cream for now so his dermatologist can see it. He does have several medications that can have skin reaction.

## 2014-07-18 NOTE — Progress Notes (Signed)
   Subjective:    Patient ID: [name], male    DOB: 12-10-1950, 64 y.o.   MRN: [id number]  HPI The patient is a 64 YO man who is coming in for follow up on his rash and fatigue/SOB. His rash is worsening and he is going to see the dermatologist on Monday. He has not used any creams on it. Denies scratching or itching. Denies fevers, chills or drainage from any of the sores. They are all over his body. He is also having more pressure in his chest and SOB. He is having it all the time and not just with walking. He denies change in exercise or diet. Denies change in weight. He has not had his heart checked in some time and has appointment at the end of this month and wants to know if we can move it up.   Review of Systems  Constitutional: Positive for fatigue. Negative for fever, activity change, appetite change and unexpected weight change.  HENT: Negative.   Respiratory: Positive for chest tightness and shortness of breath. Negative for cough.   Cardiovascular: Negative for chest pain and palpitations.  Gastrointestinal: Negative for diarrhea, constipation and blood in stool.  Musculoskeletal: Positive for back pain and arthralgias. Negative for myalgias.  Skin: Positive for color change and rash.  Neurological: Negative for dizziness, weakness and numbness.      Objective:   Physical Exam  Constitutional: He is oriented to person, place, and time. He appears well-developed and well-nourished.  HENT:  Head: Normocephalic and atraumatic.  Cardiovascular: Normal rate and regular rhythm.   Pulmonary/Chest: Effort normal and breath sounds normal.  Abdominal: Soft.  Lymphadenopathy:    He has no cervical adenopathy.  Neurological: He is alert and oriented to person, place, and time.  Skin: Skin is warm and dry.  Red bumps on the forearms bilaterally and the chest.   Filed Vitals:   07/18/14 1036  BP: 114/66  Pulse: 67  Temp: 98.4 F (36.9 C)  TempSrc: Oral  Resp: 16  Weight:  175 lb (79.379 kg)      Assessment & Plan:  Visit time 25 minutes, 50% spent in face to face counseling with the patient.

## 2014-07-18 NOTE — Progress Notes (Signed)
Pre visit review using our clinic review tool, if applicable. No additional management support is needed unless otherwise documented below in the visit note. 

## 2014-07-18 NOTE — Patient Instructions (Signed)
We will see if we can move up the cardiology appointment to sooner. If we can then you can keep the appointment with Dr. Asa Lente on the 31st to go over everything with her again.   Keep the appointment with the dermatologist on Monday. We will also see about replacing the testosterone if your heart is okay.

## 2014-07-20 ENCOUNTER — Telehealth: Payer: Self-pay | Admitting: *Deleted

## 2014-07-20 NOTE — Telephone Encounter (Signed)
PA approved - 04/2014-07/20/15 from pt's Medicare Part D, [employer].

## 2014-07-24 ENCOUNTER — Ambulatory Visit (INDEPENDENT_AMBULATORY_CARE_PROVIDER_SITE_OTHER): Payer: Medicare Other | Admitting: Cardiology

## 2014-07-24 ENCOUNTER — Encounter: Payer: Self-pay | Admitting: Cardiology

## 2014-07-24 ENCOUNTER — Other Ambulatory Visit: Payer: Self-pay | Admitting: Dermatology

## 2014-07-24 VITALS — BP 106/62 | HR 86 | Ht 69.0 in | Wt 175.6 lb

## 2014-07-24 DIAGNOSIS — R0602 Shortness of breath: Secondary | ICD-10-CM

## 2014-07-24 DIAGNOSIS — I25118 Atherosclerotic heart disease of native coronary artery with other forms of angina pectoris: Secondary | ICD-10-CM | POA: Diagnosis not present

## 2014-07-24 DIAGNOSIS — B009 Herpesviral infection, unspecified: Secondary | ICD-10-CM | POA: Diagnosis not present

## 2014-07-24 DIAGNOSIS — L308 Other specified dermatitis: Secondary | ICD-10-CM | POA: Diagnosis not present

## 2014-07-24 DIAGNOSIS — Z8619 Personal history of other infectious and parasitic diseases: Secondary | ICD-10-CM | POA: Diagnosis not present

## 2014-07-24 DIAGNOSIS — L309 Dermatitis, unspecified: Secondary | ICD-10-CM | POA: Diagnosis not present

## 2014-07-24 DIAGNOSIS — A5139 Other secondary syphilis of skin: Secondary | ICD-10-CM | POA: Insufficient documentation

## 2014-07-24 NOTE — Patient Instructions (Signed)
Your physician recommends that you schedule a follow-up appointment in: one year with Dr. Percival Spanish  We have ordered a lexiscan for you to get done  And lab work to get done

## 2014-07-24 NOTE — Progress Notes (Signed)
HPI The patient presents for followup of his known coronary disease.   He reports that he's had increasing dyspnea with minimal exertion over about 6 or 7 months. It is slowly progressive. He is not short of breath at rest and is not describing PND or orthopnea. However, walking a few 100 feet on level ground or make him short of breath. He denies any chest pressure, neck or arm discomfort. He has had some palpitations and stopped caffeine. He's not had any presyncope or syncope. He has had some mild ankle edema. He's also had apparently a hydrocele in his scrotum that has greatly increased in size. He also has a diffuse rash now that is unexplained.  Allergies  Allergen Reactions  . Morphine Other (See Comments)    REACTION: severe headache  . Other Anaphylaxis and Hives    Pecan  . Peanut-Containing Drug Products Anaphylaxis and Hives    Swelling of throat  . Penicillins Anaphylaxis and Rash    REACTION: red, flushed  . Oxycodone-Acetaminophen Other (See Comments)    REACTION: headache  . Promethazine Hcl Other (See Comments)    REACTION: makes him feel drunk    Current Outpatient Prescriptions  Medication Sig Dispense Refill  . aspirin EC 81 MG tablet Take 81 mg by mouth daily.    [name] atorvastatin (LIPITOR) 10 MG tablet TAKE 1 TABLET BY MOUTH AT BEDTIME 30 tablet 0  . elvitegravir-cobicistat-emtricitabine-tenofovir (GENVOYA) 150-150-200-10 MG TABS tablet Take 1 tablet by mouth daily with breakfast. 30 tablet 5  . EPINEPHrine (EPIPEN 2-PAK) 0.3 mg/0.3 mL IJ SOAJ injection Inject 0.3 mLs (0.3 mg total) into the muscle once. 2 Device 0  . escitalopram (LEXAPRO) 10 MG tablet Take 1 tablet (10 mg total) by mouth daily. 90 tablet 1  . inFLIXimab (REMICADE) 100 MG injection Inject into the vein every 6 (six) weeks.     [name] loratadine (CLARITIN) 10 MG tablet Take 10 mg by mouth daily as needed.    . metFORMIN (GLUCOPHAGE-XR) 500 MG 24 hr tablet Take 1 tablet (500 mg total) by mouth daily with  breakfast. 90 tablet 3  . methocarbamol (ROBAXIN) 500 MG tablet Take 500 mg by mouth every 8 (eight) hours as needed for muscle spasms.    . metoprolol succinate (TOPROL-XL) 25 MG 24 hr tablet Take 1 tablet (25 mg total) by mouth at bedtime. 30 tablet 11  . mometasone (NASONEX) 50 MCG/ACT nasal spray Place 2 sprays into the nose daily as needed (for allergies).     . ondansetron (ZOFRAN ODT) 4 MG disintegrating tablet 4mg  ODT q6 hours prn nausea/vomit 20 tablet 0  . pantoprazole (PROTONIX) 40 MG tablet TAKE 1 TABLET EVERY DAY 60 tablet 0  . pregabalin (LYRICA) 200 MG capsule Take 1 capsule (200 mg total) by mouth 2 (two) times daily. 60 capsule 5  . PREZISTA 800 MG tablet TAKE 1 TABLET (800 MG TOTAL) BY MOUTH DAILY. 30 tablet 5  . saccharomyces boulardii (FLORASTOR) 250 MG capsule Take 1 capsule (250 mg total) by mouth 2 (two) times daily. 60 capsule 3  . traMADol (ULTRAM) 50 MG tablet Take 50-100 mg by mouth every 6 (six) hours as needed (for pain).    . valACYclovir (VALTREX) 500 MG tablet TAKE 1 TABLET BY MOUTH EVERY DAY 90 tablet 3  . warfarin (COUMADIN) 10 MG tablet Take 0.5-1 tablets (5-10 mg total) by mouth daily. Takes 1/2 tab (5 mg) on Wednesday takes 1 tablet (10 mg) all other days 60 tablet 3  .  zolpidem (AMBIEN) 5 MG tablet Take 1 tablet (5 mg total) by mouth at bedtime as needed for sleep. 30 tablet 0   No current facility-administered medications for this visit.    Past Medical History  Diagnosis Date  . Osteoarthritis, knee     s/p B TKA  . HIV infection dx 1993  . TIA (transient ischemic attack) 1997    mild residual L mouth droop  . H/O hiatal hernia 2008    surgery  . Gynecomastia, male   . Impotence of organic origin   . Rheumatoid arthritis(714.0) dx 2010    MTX, follows with rheum  . Gout   . Chronic back pain     follows with Nsurg  . Coronary artery disease 2010    s/p CABG '10, sees Dr. Percival Spanish  . Seasonal allergies   . Diverticulosis   . Gallstones   .  Status post dilation of esophageal narrowing   . GERD (gastroesophageal reflux disease)   . Diverticulosis   . Esophagitis   . Hemorrhoids   . Tubular adenoma of colon   . Myocardial infarction 2010     x 2  . Hypertension   . DVT, lower extremity, recurrent 2008, 2009    LLE, chronic anticoag since 2009  . Carotid artery occlusion     40-60% right ICA stenosis (09/2008)  . Neuromuscular disorder     neuropathy  . Fibromyalgia   . Hepatitis A yrs ago  . CLL (chronic lymphoblastic leukemia) dx 2010    Followed at [facility] q67mo, no current therapy     Past Surgical History  Procedure Laterality Date  . Spine surgery  2010    "rod and screws", "failed", lopwer spine,   . Cholecystectomy    . Hiatal hernia repair      wrap  . Shoulder surgery Left   . Mandible surgery Bilateral     tmj  . Varicose vein      stripping  . Replacement total knee Bilateral   . Knee arthroplasty  07/22/2011    Procedure: COMPUTER ASSISTED TOTAL KNEE ARTHROPLASTY;  Surgeon: Meredith Pel, MD;  Location: [facility];  Service: Orthopedics;  Laterality: Right;  Right total knee arthroplasty  . Coronary artery bypass graft  2010    triple bypass  . Hardware removal N/A 07/02/2012    Procedure: HARDWARE REMOVAL;  Surgeon: Elaina Hoops, MD;  Location: [facility];  Service: Neurosurgery;  Laterality: N/A;  . Inguinal hernia repair Bilateral   . Umbilical hernia repair      x 1  . Rotator cuff repair Right   . Esophagogastroduodenoscopy (egd) with propofol N/A 12/28/2012    Procedure: ESOPHAGOGASTRODUODENOSCOPY (EGD) WITH PROPOFOL;  Surgeon: Jerene Bears, MD;  Location: [facility];  Service: Gastroenterology;  Laterality: N/A;  . Colonoscopy with propofol N/A 12/28/2012    Procedure: COLONOSCOPY WITH PROPOFOL;  Surgeon: Jerene Bears, MD;  Location: [facility];  Service: Gastroenterology;  Laterality: N/A;  . Joint replacement Left 1999  . Tonsillectomy    . Ring around testicle hernia reapir  184 and 1986     x 2  . Esophagogastroduodenoscopy (egd) with propofol N/A 03/15/2013    Procedure: ESOPHAGOGASTRODUODENOSCOPY (EGD) WITH PROPOFOL;  Surgeon: Jerene Bears, MD;  Location: [facility];  Service: Gastroenterology;  Laterality: N/A;    ROS:  As stated in the HPI and negative for all other systems.  PHYSICAL EXAM BP 106/62 mmHg  Pulse 86  Ht 5\' 9"  (  1.753 m)  Wt 175 lb 9.6 oz (79.652 kg)  BMI 25.92 kg/m2 GENERAL:  Well appearing HEENT:  Pupils equal round and reactive, fundi not visualized, oral mucosa unremarkable NECK:  No jugular venous distention, waveform within normal limits, carotid upstroke brisk and symmetric, no bruits, no thyromegaly LYMPHATICS:  No cervical, inguinal adenopathy LUNGS:  Clear to auscultation bilaterally BACK:  No CVA tenderness, severe lordosis  CHEST:  Well healed sternotomy scar. HEART:  PMI not displaced or sustained,S1 and S2 within normal limits, no S3, no S4, no clicks, no rubs, no murmurs ABD:  Flat, positive bowel sounds normal in frequency in pitch, no bruits, no rebound, no guarding, no midline pulsatile mass, no hepatomegaly, no splenomegaly GENITALIA:  Scrotal swelling. EXT:  2 plus pulses throughout, mild ankle edema, no cyanosis no clubbing SKIN:  Diffuse rash   EKG:  Sinus rhythm, rate 86, low voltage in limb leads, no acute ST-T wave changes. 07/24/2014   ASSESSMENT AND PLAN   CAD/DYSPNEA  An echocardiogram has been ordered. I will check the level. Also to exclude ischemic etiology he'll need Lexiscan Myoview.  CAROTID STENOSIS He has had minimal carotid plaque.  No follow up is needed at this time.   HYPERLIPIDEMIA I reviewed his lipid profile with an HDL of 31 and an LDL of 57. This is an appropriate level. No change in therapy is indicated.  HTN His blood pressure is well controlled and even low. He will continue on the meds as listed.  DVT The patient will continue with warfarin.  He has had recurrent DVTs.

## 2014-07-25 ENCOUNTER — Other Ambulatory Visit: Payer: Self-pay | Admitting: Cardiology

## 2014-07-25 ENCOUNTER — Ambulatory Visit: Payer: Self-pay | Admitting: Cardiovascular Disease

## 2014-07-25 ENCOUNTER — Telehealth: Payer: Self-pay | Admitting: Internal Medicine

## 2014-07-25 LAB — BRAIN NATRIURETIC PEPTIDE: Brain Natriuretic Peptide: 110 pg/mL — ABNORMAL HIGH (ref 0.0–100.0)

## 2014-07-25 NOTE — Telephone Encounter (Signed)
Please have him wait for his myoview and echo of his heart. These will help [place] decide if we need a specific lung test. Checking his heart is most important first.

## 2014-07-25 NOTE — Telephone Encounter (Signed)
Spoke with patient and he is going to schedule an acute office visit.

## 2014-07-25 NOTE — Telephone Encounter (Signed)
Patient says he is laying there and he says he can't move, or do anything. He is asking for a chest x ray. I told him that you wanted to have his heart checked out first, but he says he feels bad and does not want to wait until Friday.

## 2014-07-25 NOTE — Telephone Encounter (Signed)
Pt called in and said that he is still not feeling any better and lungs are burning .  He needs to know what he should do.  He is not feeling well at all

## 2014-07-25 NOTE — Telephone Encounter (Signed)
Patient is still not feeling well. Should I have him come back in?

## 2014-07-25 NOTE — Telephone Encounter (Signed)
Please offer acute visit or if he is significantly worse to seek care at urgent care or ER.

## 2014-07-26 ENCOUNTER — Telehealth (HOSPITAL_COMMUNITY): Payer: Self-pay

## 2014-07-26 NOTE — Telephone Encounter (Signed)
Encounter complete. 

## 2014-07-28 ENCOUNTER — Telehealth (HOSPITAL_COMMUNITY): Payer: Self-pay | Admitting: *Deleted

## 2014-07-28 ENCOUNTER — Other Ambulatory Visit (HOSPITAL_COMMUNITY): Payer: Self-pay

## 2014-07-28 ENCOUNTER — Ambulatory Visit (HOSPITAL_COMMUNITY)
Admission: RE | Admit: 2014-07-28 | Discharge: 2014-07-28 | Disposition: A | Discharge status: Home / Self Care | Payer: Medicare Other | Source: Ambulatory Visit | Attending: Cardiovascular Disease | Admitting: Cardiovascular Disease

## 2014-07-28 DIAGNOSIS — R0602 Shortness of breath: Secondary | ICD-10-CM

## 2014-08-03 ENCOUNTER — Other Ambulatory Visit: Payer: Self-pay

## 2014-08-10 ENCOUNTER — Ambulatory Visit (INDEPENDENT_AMBULATORY_CARE_PROVIDER_SITE_OTHER): Payer: Medicare Other | Admitting: Internal Medicine

## 2014-08-10 ENCOUNTER — Ambulatory Visit: Payer: Self-pay | Admitting: Cardiology

## 2014-08-10 ENCOUNTER — Other Ambulatory Visit: Payer: Medicare Other

## 2014-08-10 ENCOUNTER — Encounter: Payer: Self-pay | Admitting: Internal Medicine

## 2014-08-10 VITALS — BP 108/62 | HR 75 | Temp 97.5°F | Ht 69.0 in | Wt 176.8 lb

## 2014-08-10 DIAGNOSIS — Z23 Encounter for immunization: Secondary | ICD-10-CM | POA: Diagnosis not present

## 2014-08-10 DIAGNOSIS — B20 Human immunodeficiency virus [HIV] disease: Secondary | ICD-10-CM

## 2014-08-10 DIAGNOSIS — N5089 Other specified disorders of the male genital organs: Secondary | ICD-10-CM

## 2014-08-10 DIAGNOSIS — L02419 Cutaneous abscess of limb, unspecified: Secondary | ICD-10-CM

## 2014-08-10 DIAGNOSIS — I25118 Atherosclerotic heart disease of native coronary artery with other forms of angina pectoris: Secondary | ICD-10-CM

## 2014-08-10 DIAGNOSIS — A5139 Other secondary syphilis of skin: Secondary | ICD-10-CM

## 2014-08-10 DIAGNOSIS — L03119 Cellulitis of unspecified part of limb: Secondary | ICD-10-CM

## 2014-08-10 DIAGNOSIS — N508 Other specified disorders of male genital organs: Secondary | ICD-10-CM

## 2014-08-10 MED ORDER — CIPROFLOXACIN HCL 500 MG PO TABS: 500.0000 mg | ORAL_TABLET | Freq: Two times a day (BID) | ORAL | Status: DC

## 2014-08-10 NOTE — Patient Instructions (Addendum)
It was good to see you today.  We have reviewed your prior records including labs and tests today  We will make referral for testicular ultrasound. Your results will be released to [place] (or called to you) after review, usually within 72hours after test completion. If any changes need to be made, you will be notified at that same time.  Medications reviewed and updated Take Cipro twice a day for another week in addition to completing doxycycline Your prescription(s) have been submitted to your pharmacy. Please take as directed and contact our office if you believe you are having problem(s) with the medication(s).  We'll schedule your next primary care appointment with Dr. Ronnald Ramp. He will recheck your RPR titer in 3 months unless done by Dr. [name] who will assume your infectious disease care  Follow-up with Dr. Ronnald Ramp in the next 4-6 weeks to continue to establish care

## 2014-08-10 NOTE — Progress Notes (Signed)
Subjective:    Patient ID: [name], male    DOB: 1950-11-25, 64 y.o.   MRN: [id number]  HPI  Here for follow-up. Reviewed interval events including diagnosis of secondary syphilis as cause of diffuse rash, confirmed March 14. Has completed 12d of 2 week doxycycline course. Reports rash has improved but not resolved. Likewise tender swollen right testicle has improved with treatment but not resolved  Past Medical History  Diagnosis Date  . Osteoarthritis, knee     s/p B TKA  . HIV infection dx 1993  . TIA (transient ischemic attack) 1997    mild residual L mouth droop  . H/O hiatal hernia 2008    surgery  . Gynecomastia, male   . Impotence of organic origin   . Rheumatoid arthritis(714.0) dx 2010    MTX, follows with rheum  . Gout   . Chronic back pain     follows with Nsurg  . Coronary artery disease 2010    s/p CABG '10, sees Dr. Percival Spanish  . Seasonal allergies   . Diverticulosis   . Gallstones   . Status post dilation of esophageal narrowing   . GERD (gastroesophageal reflux disease)   . Diverticulosis   . Esophagitis   . Hemorrhoids   . Tubular adenoma of colon   . Myocardial infarction 2010     x 2  . Hypertension   . DVT, lower extremity, recurrent 2008, 2009    LLE, chronic anticoag since 2009  . Carotid artery occlusion     40-60% right ICA stenosis (09/2008)  . Neuromuscular disorder     neuropathy  . Fibromyalgia   . Hepatitis A yrs ago  . CLL (chronic lymphoblastic leukemia) dx 2010    [facility]ed [facility] q85[facility], no current therapy   . Secondary syphilis 07/24/14 dx    s/p 2 wks doxy    Review of Systems  Constitutional: Negative for fever and fatigue.  Genitourinary: Positive for scrotal swelling and testicular pain. Negative for difficulty urinating and genital sores.  Skin: Positive for rash (diffuse across body including palms, improved but not resolved). Negative for wound.       Objective:    Physical Exam  Constitutional: He is oriented  to person, place, and time. He appears well-developed and well-nourished.  Chronic, severe kyphosis - walks bent at waist, uses cane assist  Genitourinary:    Right testis shows swelling and tenderness. Right testis shows no mass. Left testis shows no mass, no swelling and no tenderness.  Neurological: He is alert and oriented to person, place, and time. No cranial nerve deficit.  Skin: Rash (resolving macular rash diffuse on body including trunk, extremities, palms -no ulceration or pustules) noted. There is erythema (distal BLE c.w cellulitis, mild).    BP 108/62 mmHg  Pulse 75  Temp(Src) 97.5 F (36.4 C) (Oral)  Ht 5\' 9"  (1.753 m)  Wt 176 lb 12 oz (80.173 kg)  BMI 26.09 kg/m2  SpO2 96% Wt Readings from Last 3 Encounters:  08/10/14 176 lb 12 oz (80.173 kg)  07/24/14 175 lb 9.6 oz (79.652 kg)  07/18/14 175 lb (79.379 kg)     Lab Results  Component Value Date   WBC 8.2 07/11/2014   HGB 12.7* 07/11/2014   HCT 37.4* 07/11/2014   PLT 239.0 07/11/2014   GLUCOSE 82 06/15/2014   CHOL 110 02/16/2014   TRIG 108 02/16/2014   HDL 31* 02/16/2014   LDLCALC 57 02/16/2014   ALT 26 06/15/2014  AST 28 06/15/2014   NA 131* 06/15/2014   K 3.9 06/15/2014   CL 98 06/15/2014   CREATININE 0.71 06/15/2014   BUN 14 06/15/2014   CO2 25 06/15/2014   TSH 0.60 07/11/2014   INR 3.1 07/14/2014   HGBA1C 5.1 11/02/2013    No results found.     Assessment & Plan:   Right testicular tenderness and swelling. Has improved with doxycycline. Possible epididymitis related to syphilis as has improved with doxycycline but will check ultrasound to exclude abscess or mass.   Mild distal leg cellulitis. History of same. Add one week Cipro to ongoing completion of doxycycline as for treatment of secondary syphilis, see next  Problem List Items Addressed This Visit    Secondary syphilis of skin - Primary    Diagnosis March 2016 by dermatology biopsy Evaluation at [facility], completing 2 weeks  doxycycline treatment given HIV status and penicillin allergy Rash has improved but not resolved Discussed with infectious disease Dr. [name] who agrees with follow-up RPR titer at 3 months (baseline 1:128 on 07/24/14 from derm - copy lab scanned into EHR today)        Other Visit Diagnoses    Swelling of right testicle        Relevant Orders    US Scrotum    Cellulitis and abscess of leg        Need for prophylactic vaccination against Streptococcus pneumoniae (pneumococcus)        Relevant Orders    Pneumococcal conjugate vaccine 13-valent (Completed)        [name], MD

## 2014-08-10 NOTE — Progress Notes (Signed)
Pre visit review using our clinic review tool, if applicable. No additional management support is needed unless otherwise documented below in the visit note. 

## 2014-08-10 NOTE — Assessment & Plan Note (Signed)
Diagnosis March 2016 by dermatology biopsy Evaluation at health department, completing 2 weeks doxycycline treatment given HIV status and penicillin allergy Rash has improved but not resolved Discussed with infectious disease Dr. [name] who agrees with follow-up RPR titer at 3 months (baseline 1:128 on 07/24/14 from derm - copy lab scanned into EHR today)

## 2014-08-11 ENCOUNTER — Telehealth (HOSPITAL_COMMUNITY): Payer: Self-pay

## 2014-08-11 ENCOUNTER — Telehealth: Payer: Self-pay | Admitting: Family

## 2014-08-11 ENCOUNTER — Telehealth: Payer: Self-pay | Admitting: *Deleted

## 2014-08-11 ENCOUNTER — Ambulatory Visit: Payer: Self-pay | Admitting: Certified Registered Nurse Anesthetist

## 2014-08-11 DIAGNOSIS — I82409 Acute embolism and thrombosis of unspecified deep veins of unspecified lower extremity: Secondary | ICD-10-CM

## 2014-08-11 DIAGNOSIS — Z7901 Long term (current) use of anticoagulants: Secondary | ICD-10-CM | POA: Diagnosis not present

## 2014-08-11 LAB — T-HELPER CELL (CD4) - (RCID CLINIC ONLY)
CD4 % Helper T Cell: 16 % — ABNORMAL LOW (ref 33–55)
CD4 T Cell Abs: 1240 /uL (ref 400–2700)

## 2014-08-11 LAB — POCT INR: INR: 5.3

## 2014-08-11 NOTE — Telephone Encounter (Signed)
Reported Critical INR 5.3.../lmb

## 2014-08-11 NOTE — Telephone Encounter (Signed)
Merrily Pew is calling on this patients critical INR taken today. If you arent in agreement with what youre seeing, please call Josh so the patient can be contacted.

## 2014-08-11 NOTE — Progress Notes (Signed)
Agree with plan 

## 2014-08-11 NOTE — Telephone Encounter (Signed)
Left message on [name]'s voicemail in agreement with plan.

## 2014-08-11 NOTE — Telephone Encounter (Signed)
Encounter complete. 

## 2014-08-12 LAB — HIV-1 RNA ULTRAQUANT REFLEX TO GENTYP+
HIV 1 RNA Quant: 31 copies/mL — ABNORMAL HIGH (ref <20)
HIV-1 RNA Quant, Log: 1.49 {Log} — ABNORMAL HIGH (ref <1.30)

## 2014-08-13 ENCOUNTER — Other Ambulatory Visit: Payer: Self-pay | Admitting: Internal Medicine

## 2014-08-13 ENCOUNTER — Other Ambulatory Visit (HOSPITAL_COMMUNITY): Payer: Self-pay | Admitting: Psychiatry

## 2014-08-13 ENCOUNTER — Other Ambulatory Visit: Payer: Self-pay | Admitting: Cardiology

## 2014-08-13 NOTE — Telephone Encounter (Signed)
I have never seen him.

## 2014-08-15 ENCOUNTER — Encounter (HOSPITAL_COMMUNITY): Payer: Self-pay

## 2014-08-15 NOTE — Telephone Encounter (Signed)
[name] this call came through on Friday not sure if its been address...[name]

## 2014-08-16 ENCOUNTER — Other Ambulatory Visit: Payer: Self-pay | Admitting: *Deleted

## 2014-08-16 ENCOUNTER — Ambulatory Visit (INDEPENDENT_AMBULATORY_CARE_PROVIDER_SITE_OTHER): Payer: Medicare Other | Admitting: General Practice

## 2014-08-16 ENCOUNTER — Ambulatory Visit (HOSPITAL_COMMUNITY)
Admission: RE | Admit: 2014-08-16 | Discharge: 2014-08-16 | Disposition: A | Discharge status: Home / Self Care | Payer: Medicare Other | Source: Ambulatory Visit | Attending: Cardiovascular Disease | Admitting: Cardiovascular Disease

## 2014-08-16 ENCOUNTER — Ambulatory Visit (HOSPITAL_BASED_OUTPATIENT_CLINIC_OR_DEPARTMENT_OTHER)
Admission: RE | Admit: 2014-08-16 | Discharge: 2014-08-16 | Disposition: A | Discharge status: Home / Self Care | Payer: Medicare Other | Source: Ambulatory Visit | Attending: Internal Medicine | Admitting: Internal Medicine

## 2014-08-16 DIAGNOSIS — R0600 Dyspnea, unspecified: Secondary | ICD-10-CM

## 2014-08-16 DIAGNOSIS — R0602 Shortness of breath: Secondary | ICD-10-CM | POA: Diagnosis not present

## 2014-08-16 DIAGNOSIS — I2510 Atherosclerotic heart disease of native coronary artery without angina pectoris: Secondary | ICD-10-CM

## 2014-08-16 LAB — POCT INR: INR: 1.5

## 2014-08-16 MED ORDER — REGADENOSON 0.4 MG/5ML IV SOLN
0.4000 mg | Freq: Once | INTRAVENOUS | Status: AC
  Administered 2014-08-16: 0.4 mg via INTRAVENOUS

## 2014-08-16 MED ORDER — TECHNETIUM TC 99M SESTAMIBI GENERIC - CARDIOLITE
31.7000 | Freq: Once | INTRAVENOUS | Status: AC | PRN
  Administered 2014-08-16: 31.7 via INTRAVENOUS

## 2014-08-16 MED ORDER — TECHNETIUM TC 99M SESTAMIBI GENERIC - CARDIOLITE
10.7000 | Freq: Once | INTRAVENOUS | Status: AC | PRN
  Administered 2014-08-16: 11 via INTRAVENOUS

## 2014-08-16 NOTE — Progress Notes (Signed)
Agree with plan 

## 2014-08-16 NOTE — Procedures (Addendum)
[name] [address] [place] [address] [phone]  Cardiology Nuclear Med Study  [name] is a 64 y.o. male     MRN : [id number]     DOB: 1951/01/19  Procedure Date: 08/16/2014  Nuclear Med Background Indication for Stress Test:  Evaluation for Ischemia and Follow up CAD History:  CAD;HIV;CABG X3--2010;MI X2--2010;DVT;HEP A;RECENT SYPHILIS Cardiac Risk Factors: Carotid Disease, Family History - CAD, Hypertension, Lipids, NIDDM and TIA  Symptoms:  DOE, Fatigue, Palpitations and SOB   Nuclear Pre-Procedure Caffeine/Decaff Intake:  7:00pm NPO After: 3:00am   IV Site: R Forearm  IV 0.9% NS with Angio Cath:  22g  Chest Size (in):  38" IV Started by: Rolene Course, RN  Height: 5\' 9"  (1.753 m)  Cup Size: n/a  BMI:  Body mass index is 25.83 kg/(m^2). Weight:  175 lb (79.379 kg)   Tech Comments:  N/A    Nuclear Med Study 1 or 2 day study: 1 day  Stress Test Type:  [place]  Order Authorizing Provider:  [name], MD   Resting Radionuclide: Technetium 3m Sestamibi  Resting Radionuclide Dose: 10.7 mCi   Stress Radionuclide:  Technetium 82m Sestamibi  Stress Radionuclide Dose: 31.7 mCi           Stress Protocol Rest HR: 53 Stress HR: 68  Rest BP: 144/76 Stress BP: 140/89  Exercise Time (min): n/a METS: n/a          Dose of Adenosine (mg):  n/a Dose of Lexiscan: 0.4 mg  Dose of Atropine (mg): n/a Dose of Dobutamine: n/a mcg/kg/min (at max HR)  Stress Test Technologist: Leane Para, CCT Nuclear Technologist:Elizabeth Young,CNMT   Rest Procedure:  Myocardial perfusion imaging was performed at rest 45 minutes following the intravenous administration of Technetium 71m Sestamibi. Stress Procedure:  The patient received IV Lexiscan 0.4 mg over 15-seconds.  Technetium 10m Sestamibi injected IV at 30-seconds.  There were no significant changes with Lexiscan.  Quantitative spect images were obtained after a  45 minute delay.  Transient Ischemic Dilatation (Normal <1.22):  0.99  QGS EDV:  148 ml QGS ESV:  74 ml LV Ejection Fraction: 50%  Rest ECG: NSR - Normal EKG  Stress ECG: No significant ST segment change suggestive of ischemia. and There are scattered PVCs.  QPS Raw Data Images:  Normal; no motion artifact; normal heart/lung ratio. Stress Images:  There is decreased uptake in the apex. Rest Images:  There is decreased uptake in the apex. Subtraction (SDS):  No evidence of ischemia.  Impression Exercise Capacity:  Lexiscan with no exercise. BP Response:  Normal blood pressure response. Clinical Symptoms:  No significant symptoms noted. ECG Impression:  There are scattered PVCs. Comparison with Prior Nuclear Study: No previous nuclear study performed  Overall Impression:  Intermediate risk stress nuclear study with moderate-size and intensity, fixed apical/septal defect suggestive of scar. No reversible ischemia.  LV Wall Motion:  Septal hypokinesis/dyskineis and apical akinesis, EF 50%  Pixie Casino, MD, [place] Certified in Nuclear Cardiology Attending Cardiologist [place], MD  08/16/2014 5:52 PM

## 2014-08-16 NOTE — Progress Notes (Signed)
2D Echo Performed 08/16/2014    Marygrace Drought, RCS

## 2014-08-16 NOTE — Progress Notes (Signed)
Pre visit review using our clinic review tool, if applicable. No additional management support is needed unless otherwise documented below in the visit note. 

## 2014-08-17 ENCOUNTER — Ambulatory Visit
Admission: RE | Admit: 2014-08-17 | Discharge: 2014-08-17 | Disposition: A | Discharge status: Home / Self Care | Payer: Medicare Other | Source: Ambulatory Visit | Attending: Internal Medicine | Admitting: Internal Medicine

## 2014-08-17 ENCOUNTER — Ambulatory Visit (INDEPENDENT_AMBULATORY_CARE_PROVIDER_SITE_OTHER): Payer: Medicare Other | Admitting: Internal Medicine

## 2014-08-17 ENCOUNTER — Encounter: Payer: Self-pay | Admitting: Internal Medicine

## 2014-08-17 VITALS — BP 151/86 | HR 79 | Temp 97.5°F | Wt 178.0 lb

## 2014-08-17 DIAGNOSIS — I2510 Atherosclerotic heart disease of native coronary artery without angina pectoris: Secondary | ICD-10-CM

## 2014-08-17 DIAGNOSIS — N433 Hydrocele, unspecified: Secondary | ICD-10-CM | POA: Diagnosis not present

## 2014-08-17 DIAGNOSIS — B20 Human immunodeficiency virus [HIV] disease: Secondary | ICD-10-CM

## 2014-08-17 DIAGNOSIS — N5089 Other specified disorders of the male genital organs: Secondary | ICD-10-CM

## 2014-08-17 DIAGNOSIS — A5139 Other secondary syphilis of skin: Secondary | ICD-10-CM | POA: Diagnosis not present

## 2014-08-17 NOTE — Progress Notes (Addendum)
  Subjective:    Patient ID: [name], male    DOB: 17-May-1950, 64 y.o.   MRN: [id number]  HPI He comes in for routine follow up.  He was on Prezista with norvir, Combivir and Isentress. He has had a lot of trouble with peripheral neuropathy and I suspect Combivir was a significant cause and I empirically changed him to daily Genvoya with Prezista.  I attempted to get a Karlyn Agee but it is not covered by his insurance.  Regardless, he started and his CD4 now is 1240 and viral load remains nearly undetectable at just 31 copies.  He is pleased with the regimen.  He feels well on remicaide for RA.  No missed doses.      He also was diagnosed with syphilis recently by dermatology after evaluation for a rash.  He expressed his disappointment with me that I did not diagnose it prior to going to his PCP for the rash and that I should have known to check since he has multiple sexual partners, typically anonymous, and unprotected. He is interested in changing HIV providers.     Review of Systems  Constitutional: Negative for fever and fatigue.  HENT: Negative for sore throat and trouble swallowing.   Eyes: Negative for visual disturbance.  Respiratory: Negative for shortness of breath.   Gastrointestinal: Negative for nausea, abdominal pain, diarrhea and abdominal distention.  Musculoskeletal: Positive for arthralgias.       Much improved on remicaide  Skin: Negative for rash.  Neurological: Negative for dizziness and headaches.  Hematological: Negative for adenopathy.  Psychiatric/Behavioral: Negative for dysphoric mood.       Objective:   Physical Exam  Constitutional: He appears well-developed. No distress.  Eyes: No scleral icterus.  Pulmonary/Chest: No respiratory distress.  Musculoskeletal:  Spinal deformity Changes of RA in hands          Assessment & Plan:

## 2014-08-17 NOTE — Assessment & Plan Note (Signed)
He is doing well on his new regimen empirically changed to [facility] with Prezista. No new issues and labs were done about 3 weeks on to the new regimen. He feels well with this. Can return in about 3 months for follow-up.

## 2014-08-17 NOTE — Assessment & Plan Note (Addendum)
He recently developed a rash and biopsy was notable for syphilis and is on treatment.

## 2014-08-21 ENCOUNTER — Other Ambulatory Visit: Payer: Self-pay

## 2014-08-21 MED ORDER — CIPROFLOXACIN HCL 500 MG PO TABS: 500.0000 mg | ORAL_TABLET | Freq: Two times a day (BID) | ORAL | Status: DC

## 2014-08-24 ENCOUNTER — Other Ambulatory Visit: Payer: Self-pay | Admitting: Internal Medicine

## 2014-08-25 ENCOUNTER — Ambulatory Visit (INDEPENDENT_AMBULATORY_CARE_PROVIDER_SITE_OTHER): Payer: Medicare Other | Admitting: Cardiology

## 2014-08-25 ENCOUNTER — Encounter: Payer: Self-pay | Admitting: Cardiology

## 2014-08-25 VITALS — BP 112/68 | HR 66 | Ht 69.0 in | Wt 179.8 lb

## 2014-08-25 DIAGNOSIS — I2510 Atherosclerotic heart disease of native coronary artery without angina pectoris: Secondary | ICD-10-CM

## 2014-08-25 DIAGNOSIS — R0602 Shortness of breath: Secondary | ICD-10-CM | POA: Diagnosis not present

## 2014-08-25 NOTE — Progress Notes (Signed)
HPI The patient presented recently for followup of his known coronary disease.   He reported that he's had increasing dyspnea with minimal exertion over about 6 or 7 months. It was slowly progressive. He is not short of breath at rest and is not describing PND or orthopnea. However, walking a few 100 feet on level ground or make him short of breath. He denied any chest pressure, neck or arm discomfort. He has had some palpitations and stopped caffeine. He's not had any presyncope or syncope.   He did have an echo with some TR but no evidence of pulmonary hypertension. Stress perfusion imaging and was treated no ischemia.  There was some question of sleep apnea because he described having to take some deep breaths at night. However, he's really not describing apneic episodes. He's not describing PND or orthopnea.   Allergies  Allergen Reactions  . Morphine Other (See Comments)    REACTION: severe headache  . Other Anaphylaxis and Hives    Pecan  . Peanut-Containing Drug Products Anaphylaxis and Hives    Swelling of throat  . Penicillins Anaphylaxis and Rash    REACTION: red, flushed  . Oxycodone-Acetaminophen Other (See Comments)    REACTION: headache  . Promethazine Hcl Other (See Comments)    REACTION: makes him feel drunk    Current Outpatient Prescriptions  Medication Sig Dispense Refill  . aspirin EC 81 MG tablet Take 81 mg by mouth daily.    [name] atorvastatin (LIPITOR) 10 MG tablet TAKE 1 TABLET BY MOUTH AT BEDTIME 30 tablet 11  . elvitegravir-cobicistat-emtricitabine-tenofovir (GENVOYA) 150-150-200-10 MG TABS tablet Take 1 tablet by mouth daily with breakfast. 30 tablet 5  . EPINEPHrine (EPIPEN 2-PAK) 0.3 mg/0.3 mL IJ SOAJ injection Inject 0.3 mLs (0.3 mg total) into the muscle once. 2 Device 0  . escitalopram (LEXAPRO) 10 MG tablet Take 1 tablet (10 mg total) by mouth daily. 90 tablet 1  . inFLIXimab (REMICADE) 100 MG injection Inject into the vein every 6 (six) weeks.     [name]  loratadine (CLARITIN) 10 MG tablet TAKE 1 TABLET EVERY DAY 30 tablet 6  . metFORMIN (GLUCOPHAGE-XR) 500 MG 24 hr tablet Take 1 tablet (500 mg total) by mouth daily with breakfast. 90 tablet 3  . methocarbamol (ROBAXIN) 500 MG tablet Take 500 mg by mouth every 8 (eight) hours as needed for muscle spasms.    . metoprolol succinate (TOPROL-XL) 25 MG 24 hr tablet Take 1 tablet (25 mg total) by mouth at bedtime. 30 tablet 11  . mometasone (NASONEX) 50 MCG/ACT nasal spray Place 2 sprays into the nose daily as needed (for allergies).     . ondansetron (ZOFRAN ODT) 4 MG disintegrating tablet 4mg  ODT q6 hours prn nausea/vomit 20 tablet 0  . pantoprazole (PROTONIX) 40 MG tablet TAKE 1 TABLET EVERY DAY 60 tablet 0  . pantoprazole (PROTONIX) 40 MG tablet TAKE 1 TABLET EVERY DAY 60 tablet 2  . pregabalin (LYRICA) 200 MG capsule Take 1 capsule (200 mg total) by mouth 2 (two) times daily. 60 capsule 5  . PREZISTA 800 MG tablet TAKE 1 TABLET (800 MG TOTAL) BY MOUTH DAILY. 30 tablet 5  . PREZISTA 800 MG tablet TAKE 1 TABLET (800 MG TOTAL) BY MOUTH DAILY. 30 tablet 5  . saccharomyces boulardii (FLORASTOR) 250 MG capsule Take 1 capsule (250 mg total) by mouth 2 (two) times daily. 60 capsule 3  . traMADol (ULTRAM) 50 MG tablet Take 50-100 mg by mouth every 6 (six) hours  as needed (for pain).    . valACYclovir (VALTREX) 500 MG tablet TAKE 1 TABLET BY MOUTH EVERY DAY 90 tablet 3  . warfarin (COUMADIN) 10 MG tablet Take 0.5-1 tablets (5-10 mg total) by mouth daily. Takes 1/2 tab (5 mg) on Wednesday takes 1 tablet (10 mg) all other days 60 tablet 3  . warfarin (COUMADIN) 10 MG tablet Take 1 tablet (10 mg total) by mouth one time only at 6 PM. 30 tablet 2  . zolpidem (AMBIEN) 5 MG tablet Take 1 tablet (5 mg total) by mouth at bedtime as needed for sleep. 30 tablet 0   No current facility-administered medications for this visit.    Past Medical History  Diagnosis Date  . Osteoarthritis, knee     s/p B TKA  . HIV  infection dx 1993  . TIA (transient ischemic attack) 1997    mild residual L mouth droop  . H/O hiatal hernia 2008    surgery  . Gynecomastia, male   . Impotence of organic origin   . Rheumatoid arthritis(714.0) dx 2010    MTX, follows with rheum  . Gout   . Chronic back pain     follows with Nsurg  . Coronary artery disease 2010    s/p CABG '10, sees Dr. Percival Spanish  . Seasonal allergies   . Diverticulosis   . Gallstones   . Status post dilation of esophageal narrowing   . GERD (gastroesophageal reflux disease)   . Diverticulosis   . Esophagitis   . Hemorrhoids   . Tubular adenoma of colon   . Myocardial infarction 2010     x 2  . Hypertension   . DVT, lower extremity, recurrent 2008, 2009    LLE, chronic anticoag since 2009  . Carotid artery occlusion     40-60% right ICA stenosis (09/2008)  . Neuromuscular disorder     neuropathy  . Fibromyalgia   . Hepatitis A yrs ago  . CLL (chronic lymphoblastic leukemia) dx 2010    [facility]ed at [facility] q74mo, no current therapy   . Secondary syphilis 07/24/14 dx    s/p 2 wks doxy    Past Surgical History  Procedure Laterality Date  . Spine surgery  2010    "rod and screws", "failed", lopwer spine,   . Cholecystectomy    . Hiatal hernia repair      wrap  . Shoulder surgery Left   . Mandible surgery Bilateral     tmj  . Varicose vein      stripping  . Replacement total knee Bilateral   . Knee arthroplasty  07/22/2011    Procedure: COMPUTER ASSISTED TOTAL KNEE ARTHROPLASTY;  Surgeon: Meredith Pel, MD;  Location: [facility];  Service: Orthopedics;  Laterality: Right;  Right total knee arthroplasty  . Coronary artery bypass graft  2010    triple bypass  . Hardware removal N/A 07/02/2012    Procedure: HARDWARE REMOVAL;  Surgeon: Elaina Hoops, MD;  Location: [facility];  Service: Neurosurgery;  Laterality: N/A;  . Inguinal hernia repair Bilateral   . Umbilical hernia repair      x 1  . Rotator cuff repair Right   .  Esophagogastroduodenoscopy (egd) with propofol N/A 12/28/2012    Procedure: ESOPHAGOGASTRODUODENOSCOPY (EGD) WITH PROPOFOL;  Surgeon: Jerene Bears, MD;  Location: [facility];  Service: Gastroenterology;  Laterality: N/A;  . Colonoscopy with propofol N/A 12/28/2012    Procedure: COLONOSCOPY WITH PROPOFOL;  Surgeon: Jerene Bears, MD;  Location:  [facility];  Service: Gastroenterology;  Laterality: N/A;  . Joint replacement Left 1999  . Tonsillectomy    . Ring around testicle hernia reapir  184 and 1986    x 2  . Esophagogastroduodenoscopy (egd) with propofol N/A 03/15/2013    Procedure: ESOPHAGOGASTRODUODENOSCOPY (EGD) WITH PROPOFOL;  Surgeon: Jerene Bears, MD;  Location: [facility];  Service: Gastroenterology;  Laterality: N/A;    ROS:  As stated in the HPI and negative for all other systems.  PHYSICAL EXAM BP 112/68 mmHg  Ht 5\' 9"  (1.753 m)  Wt 179 lb 12.8 oz (81.557 kg)  BMI 26.54 kg/m2 GENERAL:  Well appearing HEENT:  Pupils equal round and reactive, fundi not visualized, oral mucosa unremarkable NECK:  No jugular venous distention, waveform within normal limits, carotid upstroke brisk and symmetric, no bruits, no thyromegaly LYMPHATICS:  No cervical, inguinal adenopathy LUNGS:  Clear to auscultation bilaterally BACK:  No CVA tenderness, severe lordosis  CHEST:  Well healed sternotomy scar. HEART:  PMI not displaced or sustained,S1 and S2 within normal limits, no S3, no S4, no clicks, no rubs, no murmurs ABD:  Flat, positive bowel sounds normal in frequency in pitch, no bruits, no rebound, no guarding, no midline pulsatile mass, no hepatomegaly, no splenomegaly EXT:  2 plus pulses throughout, mild ankle edema, no cyanosis no clubbing SKIN:  No rash   ASSESSMENT AND PLAN   CAD/DYSPNEA  The next step will be pulmonary function testing. He may have some mechanical problem with his spine deformities. I do not see a cardiac etiology.  CAROTID STENOSIS He has had minimal carotid  plaque.  No follow up is needed at this time.   HTN His blood pressure is well controlled and even low. He will continue on the meds as listed.  DVT The patient will continue with warfarin.  He has had recurrent DVTs.

## 2014-08-25 NOTE — Patient Instructions (Signed)
Your physician wants you to follow-up in: 1 Year You will receive a reminder letter in the mail two months in advance. If you don't receive a letter, please call our office to schedule the follow-up appointment.  Your physician has recommended that you have a pulmonary function test. Pulmonary Function Tests are a group of tests that measure how well air moves in and out of your lungs.

## 2014-08-28 ENCOUNTER — Other Ambulatory Visit: Payer: Self-pay | Admitting: Internal Medicine

## 2014-08-30 NOTE — Telephone Encounter (Signed)
appt made with Dr. Ronnald Ramp for Thursday the 28th.

## 2014-08-30 NOTE — Telephone Encounter (Signed)
He still has not received his diabetic shoes. Please call patient to discuss other options. [facility] is a place he has found that maybe he can get them. Their phone # is [phone] and fax # is [phone]

## 2014-08-31 ENCOUNTER — Ambulatory Visit (HOSPITAL_COMMUNITY)
Admission: RE | Admit: 2014-08-31 | Discharge: 2014-08-31 | Disposition: A | Discharge status: Home / Self Care | Payer: Medicare Other | Source: Ambulatory Visit | Attending: Cardiology | Admitting: Cardiology

## 2014-08-31 DIAGNOSIS — R0602 Shortness of breath: Secondary | ICD-10-CM | POA: Diagnosis not present

## 2014-08-31 LAB — PULMONARY FUNCTION TEST
DL/VA % pred: 94 %
DL/VA: 4.05 ml/min/mmHg/L
DLCO unc % pred: 63 %
DLCO unc: 16.15 ml/min/mmHg
FEF 25-75 Post: 2.88 L/sec
FEF 25-75 Pre: 2.19 L/sec
FEF2575-%Change-Post: 31 %
FEF2575-%Pred-Post: 130 %
FEF2575-%Pred-Pre: 99 %
FEV1-%Change-Post: 8 %
FEV1-%Pred-Post: 115 %
FEV1-%Pred-Pre: 106 %
FEV1-Post: 3.21 L
FEV1-Pre: 2.96 L
FEV1FVC-%Change-Post: 5 %
FEV1FVC-%Pred-Pre: 100 %
FEV6-%Change-Post: 4 %
FEV6-%Pred-Post: 115 %
FEV6-%Pred-Pre: 110 %
FEV6-Post: 4.09 L
FEV6-Pre: 3.91 L
FEV6FVC-%Change-Post: 1 %
FEV6FVC-%Pred-Post: 105 %
FEV6FVC-%Pred-Pre: 104 %
FVC-%Change-Post: 3 %
FVC-%Pred-Post: 109 %
FVC-%Pred-Pre: 106 %
FVC-Post: 4.11 L
FVC-Pre: 3.98 L
Post FEV1/FVC ratio: 78 %
Post FEV6/FVC ratio: 99 %
Pre FEV1/FVC ratio: 74 %
Pre FEV6/FVC Ratio: 98 %
RV % pred: 97 %
RV: 2.05 L
TLC % pred: 92 %
TLC: 5.54 L

## 2014-08-31 MED ORDER — ALBUTEROL SULFATE (2.5 MG/3ML) 0.083% IN NEBU
2.5000 mg | INHALATION_SOLUTION | Freq: Once | RESPIRATORY_TRACT | Status: AC
  Administered 2014-08-31: 2.5 mg via RESPIRATORY_TRACT

## 2014-09-01 DIAGNOSIS — M0589 Other rheumatoid arthritis with rheumatoid factor of multiple sites: Secondary | ICD-10-CM | POA: Diagnosis not present

## 2014-09-05 ENCOUNTER — Other Ambulatory Visit (INDEPENDENT_AMBULATORY_CARE_PROVIDER_SITE_OTHER): Payer: Medicare Other

## 2014-09-05 ENCOUNTER — Ambulatory Visit: Payer: Self-pay | Admitting: Internal Medicine

## 2014-09-05 ENCOUNTER — Encounter: Payer: Self-pay | Admitting: Internal Medicine

## 2014-09-05 ENCOUNTER — Ambulatory Visit (INDEPENDENT_AMBULATORY_CARE_PROVIDER_SITE_OTHER): Payer: Medicare Other | Admitting: Internal Medicine

## 2014-09-05 VITALS — BP 130/68 | HR 68 | Temp 97.8°F | Resp 16 | Ht 69.0 in | Wt 178.0 lb

## 2014-09-05 DIAGNOSIS — E785 Hyperlipidemia, unspecified: Secondary | ICD-10-CM

## 2014-09-05 DIAGNOSIS — I10 Essential (primary) hypertension: Secondary | ICD-10-CM

## 2014-09-05 DIAGNOSIS — E118 Type 2 diabetes mellitus with unspecified complications: Secondary | ICD-10-CM | POA: Diagnosis not present

## 2014-09-05 DIAGNOSIS — L200 Besnier's prurigo: Secondary | ICD-10-CM | POA: Diagnosis not present

## 2014-09-05 DIAGNOSIS — I2510 Atherosclerotic heart disease of native coronary artery without angina pectoris: Secondary | ICD-10-CM | POA: Diagnosis not present

## 2014-09-05 DIAGNOSIS — L239 Allergic contact dermatitis, unspecified cause: Secondary | ICD-10-CM | POA: Insufficient documentation

## 2014-09-05 LAB — BASIC METABOLIC PANEL
BUN: 26 mg/dL — ABNORMAL HIGH (ref 6–23)
CO2: 31 mEq/L (ref 19–32)
Calcium: 8.7 mg/dL (ref 8.4–10.5)
Chloride: 106 mEq/L (ref 96–112)
Creatinine, Ser: 1.06 mg/dL (ref 0.40–1.50)
GFR: 74.22 mL/min (ref >60.00)
Glucose, Bld: 72 mg/dL (ref 70–99)
Potassium: 4.2 mEq/L (ref 3.5–5.1)
Sodium: 140 mEq/L (ref 135–145)

## 2014-09-05 LAB — HEMOGLOBIN A1C: Hgb A1c MFr Bld: 4.7 % (ref 4.6–6.5)

## 2014-09-05 MED ORDER — CLOBETASOL PROPIONATE 0.05 % EX OINT: 1.0000 "application " | TOPICAL_OINTMENT | Freq: Two times a day (BID) | CUTANEOUS | Status: DC

## 2014-09-05 NOTE — Assessment & Plan Note (Signed)
The mercurochrome may be contributing to this so I have asked him to stop using it Will treat the rash and itching with clobetasol ointment

## 2014-09-05 NOTE — Progress Notes (Signed)
Pre visit review using our clinic review tool, if applicable. No additional management support is needed unless otherwise documented below in the visit note. 

## 2014-09-05 NOTE — Assessment & Plan Note (Signed)
His A1C is low and he has had some episodes of hypoglycemia I have therefore asked him to stop taking metformin

## 2014-09-05 NOTE — Progress Notes (Addendum)
Subjective:    Patient ID: [name], male    DOB: 1950/06/16, 64 y.o.   MRN: [id number]  Diabetes He presents for his follow-up diabetic visit. He has type 2 diabetes mellitus. His disease course has been stable. Pertinent negatives for hypoglycemia include no nervousness/anxiousness or pallor. Associated symptoms include fatigue and foot paresthesias. Pertinent negatives for diabetes include no blurred vision, no chest pain, no foot ulcerations, no polydipsia, no polyphagia, no polyuria, no visual change, no weakness and no weight loss. There are no hypoglycemic complications. Diabetic complications include peripheral neuropathy and PVD. Current diabetic treatment includes oral agent (monotherapy). He is compliant with treatment all of the time. His weight is stable. He is following a generally healthy diet. Meal planning includes avoidance of concentrated sweets. He participates in exercise intermittently. There is no change in his home blood glucose trend.  Rash This is a new problem. The current episode started in the past 7 days. The problem is unchanged. The affected locations include the abdomen. The rash is characterized by dryness, itchiness and redness. He was exposed to a new medication (mercuricome ). Associated symptoms include fatigue. Pertinent negatives include no anorexia, congestion, cough, diarrhea, eye pain, facial edema, fever, joint pain, nail changes, rhinorrhea, shortness of breath, sore throat or vomiting. Past treatments include antibiotic cream. The treatment provided no relief. His past medical history is significant for allergies and eczema. There is no history of asthma or varicella.      Review of Systems  Constitutional: Positive for fatigue. Negative for fever, chills, weight loss and diaphoresis.  HENT: Negative.  Negative for congestion, rhinorrhea and sore throat.   Eyes: Negative.  Negative for blurred vision and pain.  Respiratory: Negative.  Negative for  cough and shortness of breath.   Cardiovascular: Negative.  Negative for chest pain, palpitations and leg swelling.  Gastrointestinal: Negative.  Negative for vomiting, abdominal pain, diarrhea and anorexia.  Endocrine: Negative.  Negative for polydipsia, polyphagia and polyuria.  Genitourinary: Negative.   Musculoskeletal: Positive for back pain. Negative for myalgias, joint pain and joint swelling.  Skin: Positive for color change and rash. Negative for nail changes, pallor and wound.  Allergic/Immunologic: Negative.   Neurological: Negative.  Negative for weakness.  Hematological: Negative.  Negative for adenopathy. Does not bruise/bleed easily.  Psychiatric/Behavioral: The patient is not nervous/anxious.        Objective:   Physical Exam  Constitutional: He is oriented to person, place, and time. He appears well-developed and well-nourished. No distress.  HENT:  Head: Normocephalic and atraumatic.  Mouth/Throat: Oropharynx is clear and moist. No oropharyngeal exudate.  Eyes: Conjunctivae are normal. Right eye exhibits no discharge. Left eye exhibits no discharge. No scleral icterus.  Neck: Normal range of motion. Neck supple. No JVD present. No tracheal deviation present. No thyromegaly present.  Cardiovascular: Normal rate, regular rhythm, normal heart sounds and intact distal pulses.  Exam reveals no gallop and no friction rub.   No murmur heard. Pulmonary/Chest: Effort normal and breath sounds normal. No stridor. No respiratory distress. He has no wheezes. He has no rales. He exhibits no tenderness.  Abdominal: Soft. Bowel sounds are normal. He exhibits no distension and no mass. There is no tenderness. There is no rebound and no guarding.  Musculoskeletal: Normal range of motion. He exhibits edema (trace edema in BLE). He exhibits no tenderness.  Over both feet there is foot deformity, peripheral neuropathy with evidence of callus formation  Lymphadenopathy:    He has no  cervical  adenopathy.  Neurological: He is oriented to person, place, and time.  Skin: Skin is warm and dry. Rash noted. No ecchymosis, no lesion, no petechiae and no purpura noted. Rash is papular. Rash is not macular, not maculopapular, not nodular, not pustular, not vesicular and not urticarial. He is not diaphoretic. There is erythema.     Psychiatric: He has a normal mood and affect. His behavior is normal. Judgment and thought content normal.  Vitals reviewed.    Lab Results  Component Value Date   WBC 8.2 07/11/2014   HGB 12.7* 07/11/2014   HCT 37.4* 07/11/2014   PLT 239.0 07/11/2014   GLUCOSE 82 06/15/2014   CHOL 110 02/16/2014   TRIG 108 02/16/2014   HDL 31* 02/16/2014   LDLCALC 57 02/16/2014   ALT 26 06/15/2014   AST 28 06/15/2014   NA 131* 06/15/2014   K 3.9 06/15/2014   CL 98 06/15/2014   CREATININE 0.71 06/15/2014   BUN 14 06/15/2014   CO2 25 06/15/2014   TSH 0.60 07/11/2014   INR 1.5 08/16/2014   HGBA1C 5.1 11/02/2013       Assessment & Plan:

## 2014-09-05 NOTE — Assessment & Plan Note (Signed)
His BP is well controlled Lytes and renal function are stable 

## 2014-09-05 NOTE — Patient Instructions (Signed)
Eczema Eczema, also called atopic dermatitis, is a skin disorder that causes inflammation of the skin. It causes a red rash and dry, scaly skin. The skin becomes very itchy. Eczema is generally worse during the cooler winter months and often improves with the warmth of summer. Eczema usually starts showing signs in infancy. Some children outgrow eczema, but it may last through adulthood.  CAUSES  The exact cause of eczema is not known, but it appears to run in families. People with eczema often have a family history of eczema, allergies, asthma, or hay fever. Eczema is not contagious. Flare-ups of the condition may be caused by:   Contact with something you are sensitive or allergic to.   Stress. SIGNS AND SYMPTOMS  Dry, scaly skin.   Red, itchy rash.   Itchiness. This may occur before the skin rash and may be very intense.  DIAGNOSIS  The diagnosis of eczema is usually made based on symptoms and medical history. TREATMENT  Eczema cannot be cured, but symptoms usually can be controlled with treatment and other strategies. A treatment plan might include:  Controlling the itching and scratching.   Use over-the-counter antihistamines as directed for itching. This is especially useful at night when the itching tends to be worse.   Use over-the-counter steroid creams as directed for itching.   Avoid scratching. Scratching makes the rash and itching worse. It may also result in a skin infection (impetigo) due to a break in the skin caused by scratching.   Keeping the skin well moisturized with creams every day. This will seal in moisture and help prevent dryness. Lotions that contain alcohol and water should be avoided because they can dry the skin.   Limiting exposure to things that you are sensitive or allergic to (allergens).   Recognizing situations that cause stress.   Developing a plan to manage stress.  HOME CARE INSTRUCTIONS   Only take over-the-counter or  prescription medicines as directed by your health care provider.   Do not use anything on the skin without checking with your health care provider.   Keep baths or showers short (5 minutes) in warm (not hot) water. Use mild cleansers for bathing. These should be unscented. You may add nonperfumed bath oil to the bath water. It is best to avoid soap and bubble bath.   Immediately after a bath or shower, when the skin is still damp, apply a moisturizing ointment to the entire body. This ointment should be a petroleum ointment. This will seal in moisture and help prevent dryness. The thicker the ointment, the better. These should be unscented.   Keep fingernails cut short. Children with eczema may need to wear soft gloves or mittens at night after applying an ointment.   Dress in clothes made of cotton or cotton blends. Dress lightly, because heat increases itching.   A child with eczema should stay away from anyone with fever blisters or cold sores. The virus that causes fever blisters (herpes simplex) can cause a serious skin infection in children with eczema. SEEK MEDICAL CARE IF:   Your itching interferes with sleep.   Your rash gets worse or is not better within 1 week after starting treatment.   You see pus or soft yellow scabs in the rash area.   You have a fever.   You have a rash flare-up after contact with someone who has fever blisters.  Document Released: 04/25/2000 Document Revised: 02/16/2013 Document Reviewed: 11/29/2012 [employer] Patient Information 2015 [employer]. This information   is not intended to replace advice given to you by your health care provider. Make sure you discuss any questions you have with your health care provider.  

## 2014-09-07 ENCOUNTER — Ambulatory Visit: Payer: Self-pay | Admitting: Internal Medicine

## 2014-09-12 ENCOUNTER — Telehealth (INDEPENDENT_AMBULATORY_CARE_PROVIDER_SITE_OTHER): Payer: Medicare Other | Admitting: Internal Medicine

## 2014-09-12 DIAGNOSIS — E118 Type 2 diabetes mellitus with unspecified complications: Secondary | ICD-10-CM

## 2014-09-12 NOTE — Telephone Encounter (Signed)
I previously advised that this form was faxed the same day of appt as soon as MD gave it to me. Form was then sent down to medical records to be scanned into chart. I am not aware of what happens after it is sent down stairs or exact timeframe of which it can then be viewed from the media tab. Will check with medical record as to where the paperwork goes next and turn around time before it can be viewed. Until then facility can fax again if they would like.

## 2014-09-12 NOTE — Telephone Encounter (Signed)
Paperwork was faxed at right after office visit and sent down to be scanned into his chart.

## 2014-09-12 NOTE — Telephone Encounter (Signed)
LA - do you know what happened with this paperwork?

## 2014-09-12 NOTE — Telephone Encounter (Signed)
This paperwork was completed and sent during his last visit - please confirm with [facility]

## 2014-09-12 NOTE — Telephone Encounter (Signed)
Kish... Have you seen paperwork...[name]

## 2014-09-12 NOTE — Telephone Encounter (Signed)
Notified pt with response below. Pt states that not sure what happen but the facility stated they didn't receive. Needing the prescription & ov notes from 09/05/14...Johny Chess

## 2014-09-12 NOTE — Telephone Encounter (Signed)
Patient called regarding his diabetic shoes and the script and notes need to be sent over to [facility] in [place]. They have faxed paperwork over twice.

## 2014-09-13 ENCOUNTER — Ambulatory Visit (INDEPENDENT_AMBULATORY_CARE_PROVIDER_SITE_OTHER): Payer: Medicare Other | Admitting: General Practice

## 2014-09-13 LAB — POCT INR: INR: 2.8

## 2014-09-13 NOTE — Telephone Encounter (Signed)
I have been informed that scanning process is about a month behind. Per pt, the actual form has been received but they still require an office note and script in addition to that form. Office note printed as well as Rx for diabetic shoes.

## 2014-09-13 NOTE — Progress Notes (Signed)
Pre visit review using our clinic review tool, if applicable. No additional management support is needed unless otherwise documented below in the visit note. 

## 2014-09-13 NOTE — Progress Notes (Signed)
Agree with plan 

## 2014-09-14 ENCOUNTER — Ambulatory Visit: Payer: Self-pay | Admitting: Internal Medicine

## 2014-09-15 ENCOUNTER — Telehealth: Payer: Self-pay | Admitting: *Deleted

## 2014-09-15 NOTE — Telephone Encounter (Signed)
Rx for diabetic shoes, and office noted faxed to [facility] at (f) [phone], [phone]. LMOVM  Informing pt

## 2014-09-15 NOTE — Telephone Encounter (Signed)
Received PA request from [facility] [id number]). [employer] did not approve Genvoya, would not cover the $30 copay after Medicare.  Patient had to pay $30 to receive the medication (he did).  RN contacted [employer] to initiate PA.  They will not speak with the prescriber's office, will only with the pharmacy.  RN notified Sarah at [facility].  They will pursue the approval through [place]. Landis Gandy, RN [employer]: [phone]

## 2014-09-18 ENCOUNTER — Telehealth: Payer: Self-pay

## 2014-09-18 NOTE — Telephone Encounter (Signed)
[name] w/ [facility] states that form, Rx, and office note have all been received, however pt will have to pay out of pocket unless 09/05/14 office note is addend to reflect what MD selected on the form for diabetic shoes. Choices that were selected on the form by MD  states foot deformity, peripheral neuropathy with evidence of callus formation, and history of preulcerative callus. This information must also be addended in the office note and signed. Thanks

## 2014-09-19 DIAGNOSIS — L84 Corns and callosities: Secondary | ICD-10-CM | POA: Diagnosis not present

## 2014-09-19 DIAGNOSIS — E1142 Type 2 diabetes mellitus with diabetic polyneuropathy: Secondary | ICD-10-CM | POA: Diagnosis not present

## 2014-09-19 DIAGNOSIS — B351 Tinea unguium: Secondary | ICD-10-CM | POA: Diagnosis not present

## 2014-09-24 NOTE — Telephone Encounter (Signed)
Info added to the note

## 2014-09-25 NOTE — Telephone Encounter (Signed)
Updated information faxed and verified with [employer] that it has been received. Document sent to be scanned.

## 2014-09-29 DIAGNOSIS — I82409 Acute embolism and thrombosis of unspecified deep veins of unspecified lower extremity: Secondary | ICD-10-CM | POA: Diagnosis not present

## 2014-09-29 DIAGNOSIS — Z7901 Long term (current) use of anticoagulants: Secondary | ICD-10-CM | POA: Diagnosis not present

## 2014-10-02 ENCOUNTER — Ambulatory Visit (INDEPENDENT_AMBULATORY_CARE_PROVIDER_SITE_OTHER): Payer: Medicare Other | Admitting: General Practice

## 2014-10-02 DIAGNOSIS — I82409 Acute embolism and thrombosis of unspecified deep veins of unspecified lower extremity: Secondary | ICD-10-CM

## 2014-10-02 LAB — POCT INR: INR: 4.4

## 2014-10-02 NOTE — Progress Notes (Signed)
Pre visit review using our clinic review tool, if applicable. No additional management support is needed unless otherwise documented below in the visit note. 

## 2014-10-04 DIAGNOSIS — S63619A Unspecified sprain of unspecified finger, initial encounter: Secondary | ICD-10-CM | POA: Diagnosis not present

## 2014-10-04 DIAGNOSIS — M19041 Primary osteoarthritis, right hand: Secondary | ICD-10-CM | POA: Diagnosis not present

## 2014-10-04 DIAGNOSIS — I509 Heart failure, unspecified: Secondary | ICD-10-CM | POA: Diagnosis not present

## 2014-10-04 DIAGNOSIS — M1990 Unspecified osteoarthritis, unspecified site: Secondary | ICD-10-CM | POA: Diagnosis not present

## 2014-10-04 DIAGNOSIS — Z955 Presence of coronary angioplasty implant and graft: Secondary | ICD-10-CM | POA: Diagnosis not present

## 2014-10-04 DIAGNOSIS — Z951 Presence of aortocoronary bypass graft: Secondary | ICD-10-CM | POA: Diagnosis not present

## 2014-10-04 DIAGNOSIS — Z9049 Acquired absence of other specified parts of digestive tract: Secondary | ICD-10-CM | POA: Diagnosis not present

## 2014-10-04 DIAGNOSIS — Z9852 Vasectomy status: Secondary | ICD-10-CM | POA: Diagnosis not present

## 2014-10-04 DIAGNOSIS — X58XXXA Exposure to other specified factors, initial encounter: Secondary | ICD-10-CM | POA: Diagnosis not present

## 2014-10-04 DIAGNOSIS — M109 Gout, unspecified: Secondary | ICD-10-CM | POA: Diagnosis not present

## 2014-10-04 DIAGNOSIS — Z79899 Other long term (current) drug therapy: Secondary | ICD-10-CM | POA: Diagnosis not present

## 2014-10-04 DIAGNOSIS — B20 Human immunodeficiency virus [HIV] disease: Secondary | ICD-10-CM | POA: Diagnosis not present

## 2014-10-04 DIAGNOSIS — S63612A Unspecified sprain of right middle finger, initial encounter: Secondary | ICD-10-CM | POA: Diagnosis not present

## 2014-10-04 DIAGNOSIS — F329 Major depressive disorder, single episode, unspecified: Secondary | ICD-10-CM | POA: Diagnosis not present

## 2014-10-04 DIAGNOSIS — Z833 Family history of diabetes mellitus: Secondary | ICD-10-CM | POA: Diagnosis not present

## 2014-10-11 ENCOUNTER — Encounter (HOSPITAL_COMMUNITY): Payer: Self-pay | Admitting: *Deleted

## 2014-10-11 ENCOUNTER — Emergency Department (INDEPENDENT_AMBULATORY_CARE_PROVIDER_SITE_OTHER)
Admission: EM | Admit: 2014-10-11 | Discharge: 2014-10-11 | Disposition: A | Discharge status: Home / Self Care | Payer: Medicare Other | Source: Home / Self Care | Attending: Family Medicine | Admitting: Family Medicine

## 2014-10-11 DIAGNOSIS — M0589 Other rheumatoid arthritis with rheumatoid factor of multiple sites: Secondary | ICD-10-CM | POA: Diagnosis not present

## 2014-10-11 DIAGNOSIS — B356 Tinea cruris: Secondary | ICD-10-CM | POA: Diagnosis not present

## 2014-10-11 MED ORDER — TERBINAFINE HCL 250 MG PO TABS
250.0000 mg | ORAL_TABLET | Freq: Every day | ORAL | Status: DC
Start: 2014-10-11 — End: 2014-11-30

## 2014-10-11 NOTE — Discharge Instructions (Signed)
Thank you for coming in today. Take terbinafine daily for 2 weeks Recheck INR (warfarin level) in 3 and 7 days. Follow-up with primary care doctor   Jock Itch Jock itch is a fungal infection of the skin in the groin area. It is sometimes called "ringworm" even though it is not caused by a worm. A fungus is a type of germ that thrives in dark, damp places.  CAUSES  This infection may spread from:  A fungus infection elsewhere on the body (such as athlete's foot).  Sharing towels or clothing. This infection is more common in:  Hot, humid climates.  People who wear tight-fitting clothing or wet bathing suits for long periods of time.  Athletes.  Overweight people.  People with diabetes. SYMPTOMS  Jock itch causes the following symptoms:  Red, pink or brown rash in the groin. Rash may spread to the thighs, anus, and buttocks.  Itching. DIAGNOSIS  Your caregiver may make the diagnosis by looking at the rash. Sometimes a skin scraping will be sent to test for fungus. Testing can be done either by looking under the microscope or by doing a culture (test to try to grow the fungus). A culture can take up to 2 weeks to come back. TREATMENT  Jock itch may be treated with:  Skin cream or ointment to kill fungus.  Medicine by mouth to kill fungus.  Skin cream or ointment to calm the itching.  Compresses or medicated powders to dry the infected skin. HOME CARE INSTRUCTIONS   Be sure to treat the rash completely. Follow your caregiver's instructions. It can take a couple of weeks to treat. If you do not treat the infection long enough, the rash can come back.  Wear loose-fitting clothing.  Men should wear cotton boxer shorts.  Women should wear cotton underwear.  Avoid hot baths.  Dry the groin area well after bathing. SEEK MEDICAL CARE IF:   Your rash is worse.  Your rash is spreading.  Your rash returns after treatment is finished.  Your rash is not gone in 4 weeks.  Fungal infections are slow to respond to treatment. Some redness may remain for several weeks after the fungus is gone. SEEK IMMEDIATE MEDICAL CARE IF:  The area becomes red, warm, tender, and swollen.  You have a fever. Document Released: 04/18/2002 Document Revised: 07/21/2011 Document Reviewed: 03/17/2008 [facility] Patient Information 2015 [place], [place]. This information is not intended to replace advice given to you by your health care provider. Make sure you discuss any questions you have with your health care provider.

## 2014-10-11 NOTE — ED Provider Notes (Signed)
[name] is a 64 y.o. male who presents to Urgent Care today for rash. Patient has rash in the groin present for a few weeks. He's tried some over-the-counter creams which did not help much. He cannot recall the name of the cream. He denies any new soaps detergents shampoos cosmetics. No fevers or chills nausea vomiting or diarrhea. He feels well otherwise. The rash is predominantly itchy.   Past Medical History  Diagnosis Date  . Osteoarthritis, knee     s/p B TKA  . HIV infection dx 1993  . TIA (transient ischemic attack) 1997    mild residual L mouth droop  . H/O hiatal hernia 2008    surgery  . Gynecomastia, male   . Impotence of organic origin   . Rheumatoid arthritis(714.0) dx 2010    MTX, follows with rheum  . Gout   . Chronic back pain     follows with Nsurg  . Coronary artery disease 2010    s/p CABG '10, sees Dr. Percival Spanish  . Seasonal allergies   . Diverticulosis   . Gallstones   . Status post dilation of esophageal narrowing   . GERD (gastroesophageal reflux disease)   . Diverticulosis   . Esophagitis   . Hemorrhoids   . Tubular adenoma of colon   . Myocardial infarction 2010     x 2  . Hypertension   . DVT, lower extremity, recurrent 2008, 2009    LLE, chronic anticoag since 2009  . Carotid artery occlusion     40-60% right ICA stenosis (09/2008)  . Neuromuscular disorder     neuropathy  . Fibromyalgia   . Hepatitis A yrs ago  . CLL (chronic lymphoblastic leukemia) dx 2010    Followed at [facility] q57mo, no current therapy   . Secondary syphilis 07/24/14 dx    s/p 2 wks doxy   Past Surgical History  Procedure Laterality Date  . Spine surgery  2010    "rod and screws", "failed", lopwer spine,   . Cholecystectomy    . Hiatal hernia repair      wrap  . Shoulder surgery Left   . Mandible surgery Bilateral     tmj  . Varicose vein      stripping  . Replacement total knee Bilateral   . Knee arthroplasty  07/22/2011    Procedure: COMPUTER ASSISTED TOTAL  KNEE ARTHROPLASTY;  Surgeon: Meredith Pel, MD;  Location: [facility];  Service: Orthopedics;  Laterality: Right;  Right total knee arthroplasty  . Coronary artery bypass graft  2010    triple bypass  . Hardware removal N/A 07/02/2012    Procedure: HARDWARE REMOVAL;  Surgeon: Elaina Hoops, MD;  Location: [facility];  Service: Neurosurgery;  Laterality: N/A;  . Inguinal hernia repair Bilateral   . Umbilical hernia repair      x 1  . Rotator cuff repair Right   . Esophagogastroduodenoscopy (egd) with propofol N/A 12/28/2012    Procedure: ESOPHAGOGASTRODUODENOSCOPY (EGD) WITH PROPOFOL;  Surgeon: Jerene Bears, MD;  Location: [facility];  Service: Gastroenterology;  Laterality: N/A;  . Colonoscopy with propofol N/A 12/28/2012    Procedure: COLONOSCOPY WITH PROPOFOL;  Surgeon: Jerene Bears, MD;  Location: [facility];  Service: Gastroenterology;  Laterality: N/A;  . Joint replacement Left 1999  . Tonsillectomy    . Ring around testicle hernia reapir  184 and 1986    x 2  . Esophagogastroduodenoscopy (egd) with propofol N/A 03/15/2013    Procedure:  ESOPHAGOGASTRODUODENOSCOPY (EGD) WITH PROPOFOL;  Surgeon: Jerene Bears, MD;  Location: [facility];  Service: Gastroenterology;  Laterality: N/A;   History  Substance Use Topics  . Smoking status: Never Smoker   . Smokeless tobacco: Never Used     Comment: occ wine  . Alcohol Use: Yes     Comment: occasional wine- 1-2 per week   ROS as above Medications: No current facility-administered medications for this encounter.   Current Outpatient Prescriptions  Medication Sig Dispense Refill  . aspirin EC 81 MG tablet Take 81 mg by mouth daily.    [name] atorvastatin (LIPITOR) 10 MG tablet TAKE 1 TABLET BY MOUTH AT BEDTIME 30 tablet 11  . clobetasol ointment (TEMOVATE) 1.32 % Apply 1 application topically 2 (two) times daily. 45 g 1  . elvitegravir-cobicistat-emtricitabine-tenofovir (GENVOYA) 150-150-200-10 MG TABS tablet Take 1 tablet by mouth daily  with breakfast. 30 tablet 5  . EPINEPHrine (EPIPEN 2-PAK) 0.3 mg/0.3 mL IJ SOAJ injection Inject 0.3 mLs (0.3 mg total) into the muscle once. 2 Device 0  . escitalopram (LEXAPRO) 10 MG tablet Take 1 tablet (10 mg total) by mouth daily. 90 tablet 1  . inFLIXimab (REMICADE) 100 MG injection Inject into the vein every 6 (six) weeks.     [name] loratadine (CLARITIN) 10 MG tablet TAKE 1 TABLET EVERY DAY 30 tablet 6  . methocarbamol (ROBAXIN) 500 MG tablet Take 500 mg by mouth every 8 (eight) hours as needed for muscle spasms.    . metoprolol succinate (TOPROL-XL) 25 MG 24 hr tablet Take 1 tablet (25 mg total) by mouth at bedtime. 30 tablet 11  . mometasone (NASONEX) 50 MCG/ACT nasal spray Place 2 sprays into the nose daily as needed (for allergies).     . NASONEX 50 MCG/ACT nasal spray USE 2 SPRAYS IN EACH NOSTRIL ONCE A DAY 17 g 11  . ondansetron (ZOFRAN ODT) 4 MG disintegrating tablet 4mg  ODT q6 hours prn nausea/vomit 20 tablet 0  . pantoprazole (PROTONIX) 40 MG tablet TAKE 1 TABLET EVERY DAY 60 tablet 0  . pregabalin (LYRICA) 200 MG capsule Take 1 capsule (200 mg total) by mouth 2 (two) times daily. 60 capsule 5  . PREZISTA 800 MG tablet TAKE 1 TABLET (800 MG TOTAL) BY MOUTH DAILY. 30 tablet 5  . saccharomyces boulardii (FLORASTOR) 250 MG capsule Take 1 capsule (250 mg total) by mouth 2 (two) times daily. 60 capsule 3  . terbinafine (LAMISIL) 250 MG tablet Take 1 tablet (250 mg total) by mouth daily. 14 tablet 0  . traMADol (ULTRAM) 50 MG tablet Take 50-100 mg by mouth every 6 (six) hours as needed (for pain).    . valACYclovir (VALTREX) 500 MG tablet TAKE 1 TABLET BY MOUTH EVERY DAY 90 tablet 3  . warfarin (COUMADIN) 10 MG tablet Take 0.5-1 tablets (5-10 mg total) by mouth daily. Takes 1/2 tab (5 mg) on Wednesday takes 1 tablet (10 mg) all other days 60 tablet 3  . zolpidem (AMBIEN) 5 MG tablet Take 1 tablet (5 mg total) by mouth at bedtime as needed for sleep. 30 tablet 0   Allergies  Allergen  Reactions  . Morphine Other (See Comments)    REACTION: severe headache  . Other Anaphylaxis and Hives    Pecan  . Peanut-Containing Drug Products Anaphylaxis and Hives    Swelling of throat  . Penicillins Anaphylaxis and Rash    REACTION: red, flushed  . Oxycodone-Acetaminophen Other (See Comments)    REACTION: headache  . Promethazine Hcl  Other (See Comments)    REACTION: makes him feel drunk     Exam:  BP 124/70 mmHg  Pulse 78  Temp(Src) 98.6 F (37 C) (Oral)  Resp 18  SpO2 100% Gen: Well NAD HEENT: EOMI,  MMM Lungs: Normal work of breathing. CTABL Heart: RRR no MRG Abd: NABS, Soft. Nondistended, Nontender Exts: Brisk capillary refill, warm and well perfused.  Skin: Erythematous maculopapular scaly rash groin and buttocks.  No results found for this or any previous visit (from the past 24 hour(s)). No results found.  Assessment and Plan: 64 y.o. male with tinea cruris. Patient has likely filled topical treatment. Trial of 2 weeks of oral terbinafine. Discussed warfarin dosing well and terbinafine. Follow-up with PCP ASAP.  Discussed warning signs or symptoms. Please see discharge instructions. Patient expresses understanding.     Gregor Hams, MD 10/11/14 [phone]

## 2014-10-11 NOTE — ED Notes (Signed)
Pt  Has  Symptoms  Of  A  Rash     perinieal  /  Groin   Area   For  Several  Weeks       Pt  Reports  Symptoms  Not  releived  By otc  meds  /  Creams

## 2014-10-13 DIAGNOSIS — R221 Localized swelling, mass and lump, neck: Secondary | ICD-10-CM | POA: Diagnosis not present

## 2014-10-13 DIAGNOSIS — I509 Heart failure, unspecified: Secondary | ICD-10-CM | POA: Diagnosis not present

## 2014-10-13 DIAGNOSIS — Z833 Family history of diabetes mellitus: Secondary | ICD-10-CM | POA: Diagnosis not present

## 2014-10-13 DIAGNOSIS — Z955 Presence of coronary angioplasty implant and graft: Secondary | ICD-10-CM | POA: Diagnosis not present

## 2014-10-13 DIAGNOSIS — T7840XA Allergy, unspecified, initial encounter: Secondary | ICD-10-CM | POA: Diagnosis not present

## 2014-10-13 DIAGNOSIS — T50995A Adverse effect of other drugs, medicaments and biological substances, initial encounter: Secondary | ICD-10-CM | POA: Diagnosis not present

## 2014-10-13 DIAGNOSIS — Z9049 Acquired absence of other specified parts of digestive tract: Secondary | ICD-10-CM | POA: Diagnosis not present

## 2014-10-13 DIAGNOSIS — B20 Human immunodeficiency virus [HIV] disease: Secondary | ICD-10-CM | POA: Diagnosis not present

## 2014-10-13 DIAGNOSIS — Z951 Presence of aortocoronary bypass graft: Secondary | ICD-10-CM | POA: Diagnosis not present

## 2014-10-13 DIAGNOSIS — M1990 Unspecified osteoarthritis, unspecified site: Secondary | ICD-10-CM | POA: Diagnosis not present

## 2014-10-13 DIAGNOSIS — M109 Gout, unspecified: Secondary | ICD-10-CM | POA: Diagnosis not present

## 2014-10-18 LAB — POCT INR: INR: 2.1

## 2014-10-19 DIAGNOSIS — H25013 Cortical age-related cataract, bilateral: Secondary | ICD-10-CM | POA: Diagnosis not present

## 2014-10-19 DIAGNOSIS — H40003 Preglaucoma, unspecified, bilateral: Secondary | ICD-10-CM | POA: Diagnosis not present

## 2014-10-19 DIAGNOSIS — E119 Type 2 diabetes mellitus without complications: Secondary | ICD-10-CM | POA: Diagnosis not present

## 2014-10-19 LAB — HM DIABETES EYE EXAM

## 2014-10-24 ENCOUNTER — Ambulatory Visit (INDEPENDENT_AMBULATORY_CARE_PROVIDER_SITE_OTHER): Payer: Medicare Other | Admitting: General Practice

## 2014-10-24 DIAGNOSIS — I82409 Acute embolism and thrombosis of unspecified deep veins of unspecified lower extremity: Secondary | ICD-10-CM

## 2014-10-24 NOTE — Progress Notes (Signed)
I have reviewed and agree with the plan. 

## 2014-10-24 NOTE — Progress Notes (Signed)
Pre visit review using our clinic review tool, if applicable. No additional management support is needed unless otherwise documented below in the visit note. 

## 2014-11-06 LAB — POCT INR: INR: 3.8

## 2014-11-07 ENCOUNTER — Ambulatory Visit (INDEPENDENT_AMBULATORY_CARE_PROVIDER_SITE_OTHER): Payer: Medicare Other | Admitting: General Practice

## 2014-11-07 DIAGNOSIS — I82409 Acute embolism and thrombosis of unspecified deep veins of unspecified lower extremity: Secondary | ICD-10-CM

## 2014-11-07 NOTE — Progress Notes (Signed)
Pre visit review using our clinic review tool, if applicable. No additional management support is needed unless otherwise documented below in the visit note. 

## 2014-11-13 NOTE — Progress Notes (Signed)
I have reviewed and agree with the plan. 

## 2014-11-16 ENCOUNTER — Other Ambulatory Visit: Payer: Self-pay | Admitting: Internal Medicine

## 2014-11-16 ENCOUNTER — Other Ambulatory Visit: Payer: Self-pay

## 2014-11-20 ENCOUNTER — Other Ambulatory Visit (HOSPITAL_COMMUNITY)
Admission: RE | Admit: 2014-11-20 | Discharge: 2014-11-20 | Disposition: A | Discharge status: Home / Self Care | Payer: Medicare Other | Source: Ambulatory Visit | Attending: Internal Medicine | Admitting: Internal Medicine

## 2014-11-20 ENCOUNTER — Other Ambulatory Visit: Payer: Medicare Other

## 2014-11-20 DIAGNOSIS — C9190 Lymphoid leukemia, unspecified not having achieved remission: Secondary | ICD-10-CM

## 2014-11-20 DIAGNOSIS — Z113 Encounter for screening for infections with a predominantly sexual mode of transmission: Secondary | ICD-10-CM | POA: Diagnosis not present

## 2014-11-20 DIAGNOSIS — Z79899 Other long term (current) drug therapy: Secondary | ICD-10-CM

## 2014-11-20 DIAGNOSIS — C9110 Chronic lymphocytic leukemia of B-cell type not having achieved remission: Secondary | ICD-10-CM

## 2014-11-20 DIAGNOSIS — B20 Human immunodeficiency virus [HIV] disease: Secondary | ICD-10-CM

## 2014-11-20 LAB — COMPLETE METABOLIC PANEL WITH GFR
ALT: 18 U/L (ref 0–53)
AST: 23 U/L (ref 0–37)
Albumin: 3.5 g/dL (ref 3.5–5.2)
Alkaline Phosphatase: 81 U/L (ref 39–117)
BUN: 19 mg/dL (ref 6–23)
CO2: 27 mEq/L (ref 19–32)
Calcium: 8.3 mg/dL — ABNORMAL LOW (ref 8.4–10.5)
Chloride: 105 mEq/L (ref 96–112)
Creat: 0.93 mg/dL (ref 0.50–1.35)
GFR, Est African American: >89 mL/min
GFR, Est Non African American: 85 mL/min
Glucose, Bld: 85 mg/dL (ref 70–99)
Potassium: 4.4 mEq/L (ref 3.5–5.3)
Sodium: 142 mEq/L (ref 135–145)
Total Bilirubin: 1.1 mg/dL (ref 0.2–1.2)
Total Protein: 5.4 g/dL — ABNORMAL LOW (ref 6.0–8.3)

## 2014-11-20 LAB — LIPID PANEL
Cholesterol: 132 mg/dL (ref 0–200)
HDL: 30 mg/dL — ABNORMAL LOW (ref >=40)
LDL Cholesterol: 82 mg/dL (ref 0–99)
Total CHOL/HDL Ratio: 4.4 Ratio
Triglycerides: 101 mg/dL (ref <150)
VLDL: 20 mg/dL (ref 0–40)

## 2014-11-21 ENCOUNTER — Other Ambulatory Visit: Payer: Self-pay

## 2014-11-21 LAB — CBC WITH DIFFERENTIAL/PLATELET
Basophils Absolute: 0 10*3/uL (ref 0.0–0.1)
Basophils Relative: 0 % (ref 0–1)
Eosinophils Absolute: 0.1 10*3/uL (ref 0.0–0.7)
Eosinophils Relative: 1 % (ref 0–5)
HCT: 38.1 % — ABNORMAL LOW (ref 39.0–52.0)
Hemoglobin: 12.6 g/dL — ABNORMAL LOW (ref 13.0–17.0)
Lymphocytes Relative: 69 % — ABNORMAL HIGH (ref 12–46)
Lymphs Abs: 9.6 10*3/uL — ABNORMAL HIGH (ref 0.7–4.0)
MCH: 30.2 pg (ref 26.0–34.0)
MCHC: 33.1 g/dL (ref 30.0–36.0)
MCV: 91.4 fL (ref 78.0–100.0)
MPV: 8.9 fL (ref 8.6–12.4)
Monocytes Absolute: 1.5 10*3/uL — ABNORMAL HIGH (ref 0.1–1.0)
Monocytes Relative: 11 % (ref 3–12)
Neutro Abs: 2.6 10*3/uL (ref 1.7–7.7)
Neutrophils Relative %: 19 % — ABNORMAL LOW (ref 43–77)
Platelets: 171 10*3/uL (ref 150–400)
RBC: 4.17 MIL/uL — ABNORMAL LOW (ref 4.22–5.81)
RDW: 14.1 % (ref 11.5–15.5)
WBC: 13.9 10*3/uL — ABNORMAL HIGH (ref 4.0–10.5)

## 2014-11-21 LAB — URINE CYTOLOGY ANCILLARY ONLY
Chlamydia: NEGATIVE
Neisseria Gonorrhea: NEGATIVE

## 2014-11-21 LAB — RPR TITER: RPR Titer: 1:32 {titer} — AB

## 2014-11-21 LAB — T-HELPER CELL (CD4) - (RCID CLINIC ONLY)
CD4 % Helper T Cell: 14 % — ABNORMAL LOW (ref 33–55)
CD4 T Cell Abs: 1400 /uL (ref 400–2700)

## 2014-11-21 LAB — RPR: RPR Ser Ql: REACTIVE — AB

## 2014-11-21 LAB — PATHOLOGIST SMEAR REVIEW

## 2014-11-21 LAB — FLUORESCENT TREPONEMAL AB(FTA)-IGG-BLD: Fluorescent Treponemal ABS: REACTIVE — AB

## 2014-11-21 LAB — POCT INR: INR: 5.7

## 2014-11-22 ENCOUNTER — Ambulatory Visit (INDEPENDENT_AMBULATORY_CARE_PROVIDER_SITE_OTHER): Payer: Medicare Other | Admitting: General Practice

## 2014-11-22 DIAGNOSIS — I82409 Acute embolism and thrombosis of unspecified deep veins of unspecified lower extremity: Secondary | ICD-10-CM

## 2014-11-22 LAB — HIV-1 RNA QUANT-NO REFLEX-BLD
HIV 1 RNA Quant: 49 copies/mL — ABNORMAL HIGH (ref <20)
HIV-1 RNA Quant, Log: 1.69 {Log} — ABNORMAL HIGH (ref <1.30)

## 2014-11-22 NOTE — Progress Notes (Signed)
I have reviewed and agree with the plan. 

## 2014-11-22 NOTE — Progress Notes (Signed)
Pre visit review using our clinic review tool, if applicable. No additional management support is needed unless otherwise documented below in the visit note. 

## 2014-11-23 DIAGNOSIS — M0589 Other rheumatoid arthritis with rheumatoid factor of multiple sites: Secondary | ICD-10-CM | POA: Diagnosis not present

## 2014-11-27 ENCOUNTER — Encounter: Payer: Self-pay | Admitting: Infectious Diseases

## 2014-11-27 ENCOUNTER — Ambulatory Visit (INDEPENDENT_AMBULATORY_CARE_PROVIDER_SITE_OTHER): Payer: Medicare Other | Admitting: Infectious Diseases

## 2014-11-27 VITALS — BP 131/72 | HR 66 | Temp 97.5°F | Wt 190.0 lb

## 2014-11-27 DIAGNOSIS — Z7901 Long term (current) use of anticoagulants: Secondary | ICD-10-CM

## 2014-11-27 DIAGNOSIS — M069 Rheumatoid arthritis, unspecified: Secondary | ICD-10-CM

## 2014-11-27 DIAGNOSIS — L03116 Cellulitis of left lower limb: Secondary | ICD-10-CM | POA: Diagnosis not present

## 2014-11-27 DIAGNOSIS — C9100 Acute lymphoblastic leukemia not having achieved remission: Secondary | ICD-10-CM

## 2014-11-27 DIAGNOSIS — Z79899 Other long term (current) drug therapy: Secondary | ICD-10-CM | POA: Diagnosis not present

## 2014-11-27 DIAGNOSIS — B20 Human immunodeficiency virus [HIV] disease: Secondary | ICD-10-CM | POA: Diagnosis not present

## 2014-11-27 DIAGNOSIS — IMO0001 Reserved for inherently not codable concepts without codable children: Secondary | ICD-10-CM

## 2014-11-27 DIAGNOSIS — Z113 Encounter for screening for infections with a predominantly sexual mode of transmission: Secondary | ICD-10-CM | POA: Diagnosis not present

## 2014-11-27 DIAGNOSIS — I2510 Atherosclerotic heart disease of native coronary artery without angina pectoris: Secondary | ICD-10-CM

## 2014-11-27 MED ORDER — DOXYCYCLINE HYCLATE 100 MG PO TABS: 100.0000 mg | ORAL_TABLET | Freq: Two times a day (BID) | ORAL | Status: DC

## 2014-11-27 NOTE — Assessment & Plan Note (Signed)
Last INR 5, held for 2 days.

## 2014-11-27 NOTE — Assessment & Plan Note (Signed)
Will give him 1 week of doxy.  Ask him to let his PCP know to f/u his INR

## 2014-11-27 NOTE — Assessment & Plan Note (Signed)
Will f/u with rheum md.

## 2014-11-27 NOTE — Progress Notes (Signed)
   Subjective:    Patient ID: JUANMIGUEL DEFELICE, male    DOB: 1950/09/18, 64 y.o.   MRN: [id number]  HPI 64 yo M with HIV+ on genvoya, DRV. Was treated for syphilis with doxy after a skin rash (07-2014). Has had f/u at health dept and his RPR has been dropping.  Also has a hx of CLL (f/u with [facility] 7-21).    HIV 1 RNA QUANT (copies/mL)  Date Value  11/20/2014 49*  08/10/2014 31*  06/15/2014 <20   CD4 T CELL ABS (/uL)  Date Value  11/20/2014 1400  08/10/2014 1240  06/15/2014 800   Has concerns about a torn muscle/L lateral quad, over a week ago. Was seen by Rheum.  Has been having swelling erythema of L ankle.  On remicaide for RA.   Review of Systems  Constitutional: Negative for appetite change and unexpected weight change.  Gastrointestinal: Negative for diarrhea and constipation.  Genitourinary: Negative for difficulty urinating.  Musculoskeletal: Positive for myalgias.        Objective:   Physical Exam  Constitutional: He appears well-developed and well-nourished.  HENT:  Mouth/Throat: No oropharyngeal exudate.  Eyes: EOM are normal. Pupils are equal, round, and reactive to light.  Neck: Neck supple.  Cardiovascular: Normal rate, regular rhythm and normal heart sounds.   Pulmonary/Chest: Effort normal and breath sounds normal.  Abdominal: Soft. Bowel sounds are normal. There is no tenderness.  Musculoskeletal:       Legs: Lymphadenopathy:    He has no cervical adenopathy.          Assessment & Plan:

## 2014-11-27 NOTE — Assessment & Plan Note (Signed)
Greatly appreciate h/o f/u.

## 2014-11-27 NOTE — Assessment & Plan Note (Signed)
He is doing very well.  We discussed his labs.  Will check a LE doppler for his LLE swelling.  rtc in 6 months.

## 2014-11-28 ENCOUNTER — Ambulatory Visit (HOSPITAL_COMMUNITY)
Admission: RE | Admit: 2014-11-28 | Discharge: 2014-11-28 | Disposition: A | Discharge status: Home / Self Care | Payer: Medicare Other | Source: Ambulatory Visit | Attending: Infectious Diseases | Admitting: Infectious Diseases

## 2014-11-28 DIAGNOSIS — L03116 Cellulitis of left lower limb: Secondary | ICD-10-CM

## 2014-11-28 DIAGNOSIS — C9100 Acute lymphoblastic leukemia not having achieved remission: Secondary | ICD-10-CM | POA: Insufficient documentation

## 2014-11-28 DIAGNOSIS — R590 Localized enlarged lymph nodes: Secondary | ICD-10-CM | POA: Insufficient documentation

## 2014-11-28 DIAGNOSIS — M79609 Pain in unspecified limb: Secondary | ICD-10-CM | POA: Insufficient documentation

## 2014-11-28 DIAGNOSIS — M7989 Other specified soft tissue disorders: Secondary | ICD-10-CM | POA: Diagnosis not present

## 2014-11-28 DIAGNOSIS — IMO0001 Reserved for inherently not codable concepts without codable children: Secondary | ICD-10-CM

## 2014-11-28 DIAGNOSIS — B20 Human immunodeficiency virus [HIV] disease: Secondary | ICD-10-CM | POA: Insufficient documentation

## 2014-11-28 DIAGNOSIS — Z7901 Long term (current) use of anticoagulants: Secondary | ICD-10-CM | POA: Diagnosis not present

## 2014-11-28 NOTE — Progress Notes (Signed)
Left lower extremity venous duplex completed.  Left:  No evidence of DVT, superficial thrombosis, or Baker's cyst.  Right:  Negative for DVT in the common femoral vein.  

## 2014-11-29 ENCOUNTER — Ambulatory Visit (INDEPENDENT_AMBULATORY_CARE_PROVIDER_SITE_OTHER): Payer: Medicare Other | Admitting: General Practice

## 2014-11-29 DIAGNOSIS — I82409 Acute embolism and thrombosis of unspecified deep veins of unspecified lower extremity: Secondary | ICD-10-CM

## 2014-11-29 LAB — POCT INR: INR: 3.1

## 2014-11-29 NOTE — Progress Notes (Signed)
I have reviewed and agree with the plan. 

## 2014-11-29 NOTE — Progress Notes (Signed)
Pre visit review using our clinic review tool, if applicable. No additional management support is needed unless otherwise documented below in the visit note. 

## 2014-11-29 NOTE — Addendum Note (Signed)
Addended by: Meriam Sprague D on: 11/29/2014 02:19 PM   Modules accepted: Level of Service

## 2014-11-30 ENCOUNTER — Other Ambulatory Visit: Payer: Self-pay

## 2014-11-30 ENCOUNTER — Ambulatory Visit (HOSPITAL_BASED_OUTPATIENT_CLINIC_OR_DEPARTMENT_OTHER): Payer: Medicare Other | Admitting: Oncology

## 2014-11-30 ENCOUNTER — Other Ambulatory Visit: Payer: Self-pay | Admitting: *Deleted

## 2014-11-30 VITALS — BP 130/68 | HR 72 | Temp 97.5°F | Resp 18 | Ht 69.0 in | Wt 191.0 lb

## 2014-11-30 DIAGNOSIS — D7589 Other specified diseases of blood and blood-forming organs: Secondary | ICD-10-CM

## 2014-11-30 DIAGNOSIS — C9110 Chronic lymphocytic leukemia of B-cell type not having achieved remission: Secondary | ICD-10-CM | POA: Diagnosis not present

## 2014-11-30 DIAGNOSIS — B20 Human immunodeficiency virus [HIV] disease: Secondary | ICD-10-CM | POA: Diagnosis not present

## 2014-11-30 MED ORDER — ATORVASTATIN CALCIUM 10 MG PO TABS: 10.0000 mg | ORAL_TABLET | Freq: Every day | ORAL | Status: DC

## 2014-11-30 NOTE — Addendum Note (Signed)
Addended by: Randolm Idol on: 11/30/2014 10:00 AM   Modules accepted: Orders, Medications

## 2014-11-30 NOTE — Progress Notes (Signed)
Hematology and Oncology Follow Up Visit  [name] [id number] 13-Jul-1950 64 y.o. 11/30/2014 9:42 AM [name], MDLeschber, Christopher Rodney, MD   Principle Diagnosis: 64 year old gentleman with CLL diagnosed in 2007 at [facility].  Current therapy: Observation and surveillance.  Interim History:  [name] presents today for a followup visit.  Since his last visit, he continues to do well without any complaints. He continue to be completely asymptomatic from his CLL. He has not reported any recent hospitalization but did have cellulitis of the lower extremities. He has completed a course of mental buttocks which have helped his symptoms. He has not reported any fevers or chills or sweats. Has not reported any cough or hemoptysis or hematemesis. Has not reported any weight loss her early satiety. He does not report any headaches or blurry vision or double vision. Does not report any syncope or seizures. Report any chest pain or shortness of breath. Does not report any cough or hemoptysis. His appetite and weight have been stable. Has not reported any change in his bowel habits such as constipation or diarrhea. He is not reporting any urinary symptoms. Rest of his review of systems unremarkable.  Medications: I have reviewed the patient's current medications.  Current Outpatient Prescriptions  Medication Sig Dispense Refill  . aspirin EC 81 MG tablet Take 81 mg by mouth daily.    [name] atorvastatin (LIPITOR) 10 MG tablet TAKE 1 TABLET BY MOUTH AT BEDTIME 30 tablet 11  . clobetasol ointment (TEMOVATE) 3.00 % Apply 1 application topically 2 (two) times daily. 45 g 1  . doxycycline (VIBRA-TABS) 100 MG tablet Take 1 tablet (100 mg total) by mouth 2 (two) times daily. 14 tablet 0  . elvitegravir-cobicistat-emtricitabine-tenofovir (GENVOYA) 150-150-200-10 MG TABS tablet Take 1 tablet by mouth daily with breakfast. 30 tablet 5  . EPINEPHrine (EPIPEN 2-PAK) 0.3 mg/0.3 mL IJ SOAJ injection  Inject 0.3 mLs (0.3 mg total) into the muscle once. 2 Device 0  . escitalopram (LEXAPRO) 10 MG tablet Take 1 tablet (10 mg total) by mouth daily. 90 tablet 1  . inFLIXimab (REMICADE) 100 MG injection Inject into the vein every 6 (six) weeks.     [name] loratadine (CLARITIN) 10 MG tablet TAKE 1 TABLET EVERY DAY 30 tablet 6  . methocarbamol (ROBAXIN) 500 MG tablet Take 500 mg by mouth every 8 (eight) hours as needed for muscle spasms.    . metoprolol succinate (TOPROL-XL) 25 MG 24 hr tablet Take 1 tablet (25 mg total) by mouth at bedtime. 30 tablet 11  . mometasone (NASONEX) 50 MCG/ACT nasal spray Place 2 sprays into the nose daily as needed (for allergies).     . NASONEX 50 MCG/ACT nasal spray USE 2 SPRAYS IN EACH NOSTRIL ONCE A DAY 17 g 11  . ondansetron (ZOFRAN ODT) 4 MG disintegrating tablet 4mg  ODT q6 hours prn nausea/vomit 20 tablet 0  . pantoprazole (PROTONIX) 40 MG tablet TAKE 1 TABLET EVERY DAY 60 tablet 0  . pregabalin (LYRICA) 200 MG capsule Take 1 capsule (200 mg total) by mouth 2 (two) times daily. 60 capsule 5  . PREZISTA 800 MG tablet TAKE 1 TABLET (800 MG TOTAL) BY MOUTH DAILY. 30 tablet 5  . saccharomyces boulardii (FLORASTOR) 250 MG capsule Take 1 capsule (250 mg total) by mouth 2 (two) times daily. 60 capsule 3  . terbinafine (LAMISIL) 250 MG tablet Take 1 tablet (250 mg total) by mouth daily. 14 tablet 0  . traMADol (ULTRAM) 50 MG tablet Take  50-100 mg by mouth every 6 (six) hours as needed (for pain).    . valACYclovir (VALTREX) 500 MG tablet TAKE 1 TABLET BY MOUTH EVERY DAY 90 tablet 3  . warfarin (COUMADIN) 10 MG tablet TAKE 1/2 ON WEDNESDAY AND 1 TABLET ALL OTHER DAYS 60 tablet 3  . zolpidem (AMBIEN) 5 MG tablet Take 1 tablet (5 mg total) by mouth at bedtime as needed for sleep. 30 tablet 0   No current facility-administered medications for this visit.     Allergies:  Allergies  Allergen Reactions  . Morphine Other (See Comments)    REACTION: severe headache  . Other  Anaphylaxis and Hives    Pecan  . Peanut-Containing Drug Products Anaphylaxis and Hives    Swelling of throat  . Penicillins Anaphylaxis and Rash    REACTION: red, flushed  . Oxycodone-Acetaminophen Other (See Comments)    REACTION: headache  . Promethazine Hcl Other (See Comments)    REACTION: makes him feel drunk    Past Medical History, Surgical history, Social history, and Family History were reviewed and updated.   Physical Exam: Blood pressure 130/68, pulse 72, temperature 97.5 F (36.4 C), temperature source Oral, resp. rate 18, height 5\' 9"  (1.753 m), weight 191 lb (86.637 kg), SpO2 98 %. ECOG: 1 General appearance: alert awake well-appearing gentleman without any distress. Head: Normocephalic, without obvious abnormality Neck: no adenopathy Lymph nodes: Cervical, supraclavicular, and axillary nodes normal. Heart:regular rate and rhythm, S1, S2 normal, no murmur, click, rub or gallop Lung:chest clear, no wheezing, rales, normal symmetric air entry. No dullness to percussion. Abdomin: soft, non-tender, without masses or organomegaly. No shifting dullness or rebound tenderness. EXT:no erythema, induration, or nodules   Lab Results: Lab Results  Component Value Date   WBC 13.9* 11/20/2014   HGB 12.6* 11/20/2014   HCT 38.1* 11/20/2014   MCV 91.4 11/20/2014   PLT 171 11/20/2014     Chemistry      Component Value Date/Time   NA 142 11/20/2014 1527   NA 138 06/01/2014 1003   K 4.4 11/20/2014 1527   K 4.5 06/01/2014 1003   CL 105 11/20/2014 1527   CO2 27 11/20/2014 1527   CO2 28 06/01/2014 1003   BUN 19 11/20/2014 1527   BUN 26.4* 06/01/2014 1003   CREATININE 0.93 11/20/2014 1527   CREATININE 1.06 09/05/2014 1439   CREATININE 0.8 06/01/2014 1003      Component Value Date/Time   CALCIUM 8.3* 11/20/2014 1527   CALCIUM 8.6 06/01/2014 1003   ALKPHOS 81 11/20/2014 1527   ALKPHOS 86 06/01/2014 1003   AST 23 11/20/2014 1527   AST 23 06/01/2014 1003   ALT 18  11/20/2014 1527   ALT 19 06/01/2014 1003   BILITOT 1.1 11/20/2014 1527   BILITOT 0.62 06/01/2014 1003       Impression and Plan:  64 year old gentleman with the following issues:  1. CLL diagnosed in 2007 presented with stage 0 CD38 positive, ZAP 70 positive. He did have some mild lymphadenopathy on CT scan in January 2015 that is mostly subcentimeter with a aortocaval lymph node largest measuring 2.0 x 1.6 cm.   His laboratory testing on 11/20/2014 were discussed after review today. His white cell count slightly elevated but still very well without any major changes in his symptomatology. For the time being, I have advised to continue active surveillance without any indication to treatment. I have reviewed indication of treatment with the patient today that includes cytopenias, immune dysregulation, opportunistic infections, normal  rapid increase in his white cell count. Symptomatic lymphadenopathy or constitutional symptoms also would be a reason to treat. At this time, I see very little evidence of that.  2. Macrocytosis: As undoubtedly related to his HIV medications I will continue to observe that no intervention is needed.   3. Follow-up: We'll be in 6 months sooner if needed to.  Mercy Westbrook, MD 7/21/20169:42 AM

## 2014-11-30 NOTE — Progress Notes (Signed)
I have reviewed and agree with the plan. 

## 2014-12-05 DIAGNOSIS — B351 Tinea unguium: Secondary | ICD-10-CM | POA: Diagnosis not present

## 2014-12-05 DIAGNOSIS — E1142 Type 2 diabetes mellitus with diabetic polyneuropathy: Secondary | ICD-10-CM | POA: Diagnosis not present

## 2014-12-05 DIAGNOSIS — L84 Corns and callosities: Secondary | ICD-10-CM | POA: Diagnosis not present

## 2014-12-18 ENCOUNTER — Ambulatory Visit (INDEPENDENT_AMBULATORY_CARE_PROVIDER_SITE_OTHER): Payer: Medicare Other | Admitting: Internal Medicine

## 2014-12-18 ENCOUNTER — Encounter: Payer: Self-pay | Admitting: Internal Medicine

## 2014-12-18 ENCOUNTER — Ambulatory Visit (INDEPENDENT_AMBULATORY_CARE_PROVIDER_SITE_OTHER)
Admission: RE | Admit: 2014-12-18 | Discharge: 2014-12-18 | Disposition: A | Discharge status: Home / Self Care | Payer: Medicare Other | Source: Ambulatory Visit | Attending: Internal Medicine | Admitting: Internal Medicine

## 2014-12-18 VITALS — BP 128/78 | HR 74 | Temp 97.8°F | Resp 16 | Ht 69.0 in | Wt 196.0 lb

## 2014-12-18 DIAGNOSIS — S4991XA Unspecified injury of right shoulder and upper arm, initial encounter: Secondary | ICD-10-CM

## 2014-12-18 DIAGNOSIS — S46211A Strain of muscle, fascia and tendon of other parts of biceps, right arm, initial encounter: Secondary | ICD-10-CM

## 2014-12-18 DIAGNOSIS — S46111A Strain of muscle, fascia and tendon of long head of biceps, right arm, initial encounter: Secondary | ICD-10-CM | POA: Diagnosis not present

## 2014-12-18 DIAGNOSIS — I2510 Atherosclerotic heart disease of native coronary artery without angina pectoris: Secondary | ICD-10-CM

## 2014-12-18 DIAGNOSIS — S40021A Contusion of right upper arm, initial encounter: Secondary | ICD-10-CM | POA: Diagnosis not present

## 2014-12-18 DIAGNOSIS — S46219A Strain of muscle, fascia and tendon of other parts of biceps, unspecified arm, initial encounter: Secondary | ICD-10-CM | POA: Insufficient documentation

## 2014-12-18 NOTE — Progress Notes (Signed)
Pre visit review using our clinic review tool, if applicable. No additional management support is needed unless otherwise documented below in the visit note. 

## 2014-12-18 NOTE — Patient Instructions (Signed)
Biceps Tendon Disruption (Distal) with Rehab The biceps tendon attaches the biceps muscle to the bones of the elbow and the shoulder. A distal biceps tendon disruption is a tear of this tendon at the end the attached near the elbow. A distal biceps tendon rupture is an uncommon injury. These injuries usually involve a complete tear of the tendon from the bone; however, partial tears are also possible. The bicep muscle works with other muscles to bend the elbow and rotate the palm upward (supinate). A complete biceps rupture will result in approximately a 30% decrease in elbow bending strength and a 40% decrease in one's ability to supinate the wrist. SYMPTOMS   Pain, tenderness, swelling, warmth, or redness at the elbow, usually in the front of the elbow.  Pain that worsens with flexion of the elbow against resistance and when straightening the elbow.  Bulge can be seen and felt in the arm.  Bruising (contusion) in the elbow or forearm after 24 hours.  Limited motion of the elbow.  Weakness with attempted elbow bending (lifting or carrying) or rotation of the wrist (like when using a screwdriver).  A crackling sound (crepitation) when the tendon or elbow is moved or touched. CAUSES  A biceps tendon rupture occurs when the tendon is subjected to a force that is greater than it can withstand, such as straightening the elbow while the biceps is contracted or direct trauma (rare). RISK INCREASES WITH:   Sports that involve contact, as well as throwing sports, gymnastics, weightlifting, and bodybuilding.  Heavy labor.  Poor strength and flexibility.  Failure to warm-up properly before activity. PREVENTION  Warm up and stretch appropriately before activity.  Maintain physical fitness:  Strength, flexibility, and endurance.  Cardiovascular fitness  Allow your body to recover between practices and competition.  Learn and use proper technique. PROGNOSIS  Surgery is usually required  to fix distal biceps tendon rupture. After surgery, a recovery period of 4 to 8 months can be expected to allow for healing and a return to sports.  RELATED COMPLICATIONS  Weakness of elbow bending and forearm rotation, especially if treated non-surgically.  Prolonged disability.  Re-rupture of the tendon after surgery.  Risks of surgery, including infection, bleeding, injury to nerves, elbow or wrist stiffness or loss of motion, and weakness of elbow bending or wrist rotation. TREATMENT  Treatment initially consists of ice and medication to help reduce pain and inflammation. A sling may also be worn to increase one's comfort. Surgery is required for a full recovery and return to sports. Surgery involves reattaching the tendon to the bone. Weakness can be expected if surgery is not performed; however, this may be acceptable for sedentary individuals. Surgery is usually followed by immobilization and rehabilitation exercises to regain strength and a full range of motion.  MEDICATION  If pain medication is necessary, nonsteroidal anti-inflammatory medications, such as aspirin and ibuprofen, or other minor pain relievers, such as acetaminophen, are often recommended.  Do not take pain medication for 7 days before surgery.  Prescription pain relievers may be given if deemed necessary by your caregiver. Use only as directed and only as much as you need. HEAT AND COLD  Cold treatment (icing) relieves pain and reduces inflammation. Cold treatment should be applied for 10 to 15 minutes every 2 to 3 hours for inflammation and pain and immediately after any activity that aggravates your symptoms. Use ice packs or an ice massage.  Heat treatment may be used prior to performing the stretching and strengthening  activities prescribed by your caregiver, physical therapist, or athletic trainer. Use a heat pack or a warm soak. SEEK MEDICAL CARE IF:   Symptoms get worse or do not improve in 2 weeks despite  treatment.  You experience pain, numbness, or coldness in the hand.  Blue, gray, or dark color appears in the fingernails.  Any of the following occur after surgery:  Increased pain, swelling, redness, drainage, or bleeding in the surgical area.  Signs of infection (headache, muscle aches, dizziness, or a general ill feeling with fever).  New, unexplained symptoms develop (drugs used in treatment may produce side effects). EXERCISES RANGE OF MOTION (ROM) AND STRETCHING EXERCISES - Biceps Tendon Disruption (Distal) Once your physician, physical therapist or athletic trainer has permitted you to discontinue using your brace or splint, you may begin to restore your elbow motion by using these exercises. Beginning these exercises before your provider's approval may result in delayed healing. While completing these exercises, remember:   Restoring tissue flexibility helps normal motion to return to the joints. This allows healthier, less painful movement and activity.  An effective stretch should be held for at least 30 seconds.  A stretch should never be painful. You should only feel a gentle lengthening or release in the stretched tissue. RANGE OF MOTION - Extension  Hold your right / left arm at your side and straighten your elbow as far as you can using your right / left arm muscles.  Straighten the right / left elbow farther by gently pushing down on your forearm until you feel a gentle stretch on the inside of your elbow. Hold this position for __________ seconds.  Slowly return to the starting position. Repeat __________ times. Complete this exercise __________ times per day.  RANGE OF MOTION - Flexion  Hold your right / left arm at your side and bend your elbow as far as you can using your right / left arm muscles.  Bend the right / left elbow farther by gently pushing up on your forearm until you feel a gentle stretch on the outside of your elbow. Hold this position for  __________ seconds.  Slowly return to the starting position. Repeat __________ times. Complete this exercise __________ times per day.  RANGE OF MOTION - Supination, Active   Stand or sit with your elbows at your side. Bend your right / left elbow to 90 degrees.  Turn your palm upward until you feel a gentle stretch on the inside of your forearm.  Hold this position for __________ seconds. Slowly release and return to the starting position. Repeat __________ times. Complete this stretch __________ times per day.  RANGE OF MOTION - Pronation, Active   Stand or sit with your elbows at your side. Bend your right / left elbow to 90 degrees.  Turn your palm downward until you feel a gentle stretch on the top of your forearm.  Hold this position for __________ seconds. Slowly release and return to the starting position. Repeat __________ times. Complete this stretch __________ times per day.  STRENGTHENING EXERCISES - Biceps Tendon Disruption (Distal) Once your physician, physical therapist, or athletic trainer has permitted you to discontinue using your brace or splint, you may begin restoring your arm strength by using these exercises. Beginning these before your provider's approval may result in delayed healing. While completing these exercises, remember:   Muscles can gain both the endurance and the strength needed for everyday activities through controlled exercises.  Complete these exercises as instructed by your physician,  physical therapist, or athletic trainer. Progress the resistance and repetitions only as guided.  You may experience muscle soreness or fatigue, but the pain or discomfort you are trying to eliminate should never worsen during these exercises. If this pain does worsen, stop and make certain you are following the directions exactly. If the pain is still present after adjustments, discontinue the exercise until you can discuss the trouble with your clinician. STRENGTH -  Elbow Flexors, Isometric   Stand or sit upright on a firm surface. Place your right / left arm so that your hand is palm-up and at the height of your waist.  Place your opposite hand on top of your forearm. Gently push down as your right / left arm resists. Push as hard as you can with both arms without causing any pain or movement at your right / left elbow. Hold this stationary position for __________ seconds.  Gradually release the tension in both arms. Allow your muscles to relax completely before repeating. Repeat __________ times. Complete this exercise __________ times per day. STRENGTH - Elbow Extensors, Isometric   Stand or sit upright on a firm surface. Place your right / left arm so that your palm faces your abdomen and it is at the height of your waist.  Place your opposite hand on the underside of your forearm. Gently push up as your right / left arm resists. Push as hard as you can with both arms without causing any pain or movement at your right / left elbow. Hold this stationary position for __________ seconds.  Gradually release the tension in both arms. Allow your muscles to relax completely before repeating. Repeat __________ times. Complete this exercise __________ times per day. STRENGTH - Elbow Flexors, Supinated  With good posture, stand or sit on a firm chair without armrests. Allow your right / left arm to rest at your side with your palm facing forward.  Holding a __________ weight or gripping a rubber exercise band/tubing, bring your right / left hand toward your shoulder.  Allow your muscles to control the resistance as your hand returns to your side. Repeat __________ times. Complete this exercise __________ times per day.  STRENGTH - Elbow Flexors, Neutral  With good posture, stand or sit on a firm chair without armrests. Allow your right / left arm to rest at your side with your thumb facing forward.  Holding a __________ weight or gripping a rubber exercise  band/tubing, bring your right / left hand toward your shoulder.  Allow your muscles to control the resistance as your hand returns to your side. Repeat __________ times. Complete this exercise __________ times per day.  STRENGTH - Elbow Extensors  Lie on your back. Extend your right / left elbow into the air, pointing it toward the ceiling. Brace your arm with your opposite hand.*  Holding a __________ weight in your hand, slowly straighten your right / left elbow.  Allow your muscles to control the weight as your hand returns to its starting position. Repeat __________ times. Complete this exercise __________ times per day. *You may also stand with your elbow overhead and pointed toward the ceiling and supported by your opposite hand. STRENGTH - Elbow Extensors, Dynamic  With good posture, stand or sit on a firm chair without armrests. Keeping your upper arms at your side, bring both hands up to your right / left shoulder while gripping a rubber exercise band/tubing. Your right / left hand should be just below the other hand.  Straighten your  right / left elbow. Hold for __________ seconds.  Allow your muscles to control the rubber exercise band/tubing as your hand returns to your shoulder. Repeat __________ times. Complete this exercise __________ times per day. STRENGTH - Forearm Supinators   Sit with your right / left forearm supported on a table, keeping your elbow below shoulder height. Rest your hand over the edge, palm down.  Gently grip a hammer or a soup ladle.  Without moving your elbow, slowly turn your palm and hand upward to a "thumbs-up" position.  Hold this position for __________ seconds. Slowly return to the starting position. Repeat __________ times. Complete this exercise __________ times per day.  STRENGTH - Forearm Pronators   Sit with your right / left forearm supported on a table, keeping your elbow below shoulder height. Rest your hand over the edge, palm  up.  Gently grip a hammer or a soup ladle.  Without moving your elbow, slowly turn your palm and hand upward to a "thumbs-up" position.  Hold this position for __________ seconds. Slowly return to the starting position. Repeat __________ times. Complete this exercise __________ times per day.  Document Released: 04/28/2005 Document Revised: 07/21/2011 Document Reviewed: 08/10/2008 [facility] Patient Information 2015 [place], [place]. This information is not intended to replace advice given to you by your health care provider. Make sure you discuss any questions you have with your health care provider.

## 2014-12-18 NOTE — Progress Notes (Signed)
Subjective:  Patient ID: [name], male    DOB: May 14, 1950  Age: 64 y.o. MRN: [id number]  CC: Arm Injury   HPI [name] presents for pain and swelling in his right upper arm. He tells me that 4 days ago he was swinging a hammer and felt the abrupt onset of pain and swelling in his right arm. Over the ensuing 4 days the swelling and bruising has extended down into his forearm.  Outpatient Prescriptions Prior to Visit  Medication Sig Dispense Refill  . aspirin EC 81 MG tablet Take 81 mg by mouth daily.    [name] atorvastatin (LIPITOR) 10 MG tablet Take 1 tablet (10 mg total) by mouth at bedtime. 90 tablet 3  . elvitegravir-cobicistat-emtricitabine-tenofovir (GENVOYA) 150-150-200-10 MG TABS tablet Take 1 tablet by mouth daily with breakfast. 30 tablet 5  . EPINEPHrine (EPIPEN 2-PAK) 0.3 mg/0.3 mL IJ SOAJ injection Inject 0.3 mLs (0.3 mg total) into the muscle once. 2 Device 0  . escitalopram (LEXAPRO) 10 MG tablet Take 1 tablet (10 mg total) by mouth daily. 90 tablet 1  . inFLIXimab (REMICADE) 100 MG injection Inject into the vein every 6 (six) weeks.     [name] loratadine (CLARITIN) 10 MG tablet TAKE 1 TABLET EVERY DAY 30 tablet 6  . metoprolol succinate (TOPROL-XL) 25 MG 24 hr tablet Take 1 tablet (25 mg total) by mouth at bedtime. 30 tablet 11  . NASONEX 50 MCG/ACT nasal spray USE 2 SPRAYS IN EACH NOSTRIL ONCE A DAY 17 g 11  . ondansetron (ZOFRAN ODT) 4 MG disintegrating tablet 4mg  ODT q6 hours prn nausea/vomit 20 tablet 0  . pantoprazole (PROTONIX) 40 MG tablet TAKE 1 TABLET EVERY DAY 60 tablet 0  . pregabalin (LYRICA) 200 MG capsule Take 1 capsule (200 mg total) by mouth 2 (two) times daily. 60 capsule 5  . PREZISTA 800 MG tablet TAKE 1 TABLET (800 MG TOTAL) BY MOUTH DAILY. 30 tablet 5  . saccharomyces boulardii (FLORASTOR) 250 MG capsule Take 1 capsule (250 mg total) by mouth 2 (two) times daily. 60 capsule 3  . traMADol (ULTRAM) 50 MG tablet Take 50-100 mg by mouth every 6 (six)  hours as needed (for pain).    . valACYclovir (VALTREX) 500 MG tablet TAKE 1 TABLET BY MOUTH EVERY DAY 90 tablet 3  . warfarin (COUMADIN) 10 MG tablet TAKE 1/2 ON WEDNESDAY AND 1 TABLET ALL OTHER DAYS 60 tablet 3  . zolpidem (AMBIEN) 5 MG tablet Take 1 tablet (5 mg total) by mouth at bedtime as needed for sleep. 30 tablet 0  . doxycycline (VIBRA-TABS) 100 MG tablet Take 1 tablet (100 mg total) by mouth 2 (two) times daily. 14 tablet 0   No facility-administered medications prior to visit.    ROS Review of Systems  Constitutional: Negative.   HENT: Negative.   Eyes: Negative.   Respiratory: Negative.   Cardiovascular: Negative.   Gastrointestinal: Negative.  Negative for abdominal pain.  Endocrine: Negative.   Genitourinary: Negative.   Musculoskeletal: Positive for joint swelling and arthralgias. Negative for myalgias, back pain and neck pain.  Skin: Negative.  Negative for rash.  Allergic/Immunologic: Negative.   Neurological: Negative.   Hematological: Negative.  Negative for adenopathy. Does not bruise/bleed easily.  Psychiatric/Behavioral: Negative.     Objective:  BP 128/78 mmHg  Pulse 74  Temp(Src) 97.8 F (36.6 C) (Oral)  Ht 5\' 9"  (1.753 m)  Wt 196 lb (88.905 kg)  BMI 28.93 kg/m2  SpO2 94%  BP Readings from Last 3 Encounters:  12/18/14 128/78  11/30/14 130/68  11/27/14 131/72    Wt Readings from Last 3 Encounters:  12/18/14 196 lb (88.905 kg)  11/30/14 191 lb (86.637 kg)  11/27/14 190 lb (86.183 kg)    Physical Exam  Musculoskeletal:       Right shoulder: He exhibits deformity. He exhibits normal range of motion, no tenderness, no bony tenderness, no swelling, no effusion, no crepitus, no laceration, no pain, no spasm, normal pulse and normal strength.       Right elbow: He exhibits swelling. He exhibits normal range of motion, no effusion, no deformity and no laceration. No tenderness found.       Right upper arm: He exhibits tenderness, swelling, edema  and deformity. He exhibits no bony tenderness and no laceration.       Right forearm: He exhibits swelling. He exhibits no tenderness, no bony tenderness, no edema, no deformity and no laceration.       Arms:   Lab Results  Component Value Date   WBC 13.9* 11/20/2014   HGB 12.6* 11/20/2014   HCT 38.1* 11/20/2014   PLT 171 11/20/2014   GLUCOSE 85 11/20/2014   CHOL 132 11/20/2014   TRIG 101 11/20/2014   HDL 30* 11/20/2014   LDLCALC 82 11/20/2014   ALT 18 11/20/2014   AST 23 11/20/2014   NA 142 11/20/2014   K 4.4 11/20/2014   CL 105 11/20/2014   CREATININE 0.93 11/20/2014   BUN 19 11/20/2014   CO2 27 11/20/2014   TSH 0.60 07/11/2014   INR 3.1 11/29/2014   HGBA1C 4.7 09/05/2014    Dg Humerus Right  12/18/2014   CLINICAL DATA:  Bruising over the biceps 3 days FT using a have are  EXAM: RIGHT HUMERUS - 2+ VIEW  COMPARISON:  Chest x-ray which included a portion of the shoulder dated November 02, 2013  FINDINGS: There is patchy lucency and increased density within the proximal diaphysis of the right humerus. Bony density is noted in the soft tissues in a fashion that suggests an avulsion fracture fragment. There is a rounded sclerotic focus in the distal diaphysis of the humerus that may reflect a bone island. There may be soft tissue swelling over the triceps region. No acute abnormality of the shoulder or elbow are observed.  IMPRESSION: Abnormal bony density over the proximal aspect of the humeral shaft that is worrisome for an avulsion fracture. There is a probable bone island in the distal diaphysis. The patient has undergone previous rotator cuff repair.  Correlation with the mechanism of the patient's injury is needed.   Electronically Signed   By: David  Martinique M.D.   On: 12/18/2014 11:45    Assessment & Plan:   [name] was seen today for arm injury.  Diagnoses and all orders for this visit:  Injury of upper arm, right, initial encounter- the exam and plain x-ray are consistent with  a small avulsion fracture over the right proximal humerus, I have asked him to see orthopedics as soon as possible. Orders: -     DG Humerus Right; Future -     Ambulatory referral to Orthopedic Surgery  Biceps muscle tear, right, initial encounter- this appears to be interfering with his ability to use his right upper extremity so I asked him to see orthopedics to see if this can be surgically repaired. Orders: -     DG Humerus Right; Future -     Ambulatory referral to Orthopedic  Surgery   I have discontinued Mr. Crite's doxycycline. I am also having him maintain his aspirin EC, inFLIXimab, saccharomyces boulardii, zolpidem, metoprolol succinate, traMADol, valACYclovir, escitalopram, PREZISTA, EPINEPHrine, pantoprazole, ondansetron, elvitegravir-cobicistat-emtricitabine-tenofovir, pregabalin, loratadine, NASONEX, warfarin, and atorvastatin.  No orders of the defined types were placed in this encounter.     Follow-up: Return in about 3 weeks (around 01/08/2015).  [name], MD

## 2014-12-19 DIAGNOSIS — S46311A Strain of muscle, fascia and tendon of triceps, right arm, initial encounter: Secondary | ICD-10-CM | POA: Diagnosis not present

## 2014-12-19 DIAGNOSIS — M79601 Pain in right arm: Secondary | ICD-10-CM | POA: Diagnosis not present

## 2014-12-19 DIAGNOSIS — M25552 Pain in left hip: Secondary | ICD-10-CM | POA: Diagnosis not present

## 2014-12-19 DIAGNOSIS — Z5181 Encounter for therapeutic drug level monitoring: Secondary | ICD-10-CM | POA: Diagnosis not present

## 2014-12-20 ENCOUNTER — Telehealth: Payer: Self-pay | Admitting: Internal Medicine

## 2014-12-20 NOTE — Telephone Encounter (Signed)
Jose from [facility] called regarding patients PT I&R Inratio monitor.  This particular monitor is being recalled He needs a verbal authorization to switch monitors. Jose's number is [phone] If he doesn't answer please leave your first and last name and title and the verbal ok to switch on his vm

## 2014-12-20 NOTE — Telephone Encounter (Signed)
ok 

## 2014-12-20 NOTE — Telephone Encounter (Signed)
Verbal ok to switch

## 2014-12-26 ENCOUNTER — Ambulatory Visit (INDEPENDENT_AMBULATORY_CARE_PROVIDER_SITE_OTHER): Payer: Medicare Other | Admitting: General Practice

## 2014-12-26 DIAGNOSIS — M899 Disorder of bone, unspecified: Secondary | ICD-10-CM | POA: Diagnosis not present

## 2014-12-26 DIAGNOSIS — I82409 Acute embolism and thrombosis of unspecified deep veins of unspecified lower extremity: Secondary | ICD-10-CM

## 2014-12-26 LAB — POCT INR: INR: 2.3

## 2014-12-26 NOTE — Progress Notes (Signed)
I have reviewed and agree with the plan. 

## 2014-12-26 NOTE — Progress Notes (Signed)
Pre visit review using our clinic review tool, if applicable. No additional management support is needed unless otherwise documented below in the visit note. 

## 2014-12-28 DIAGNOSIS — M0589 Other rheumatoid arthritis with rheumatoid factor of multiple sites: Secondary | ICD-10-CM | POA: Diagnosis not present

## 2015-01-04 DIAGNOSIS — M25552 Pain in left hip: Secondary | ICD-10-CM | POA: Diagnosis not present

## 2015-01-04 DIAGNOSIS — S46311A Strain of muscle, fascia and tendon of triceps, right arm, initial encounter: Secondary | ICD-10-CM | POA: Diagnosis not present

## 2015-01-04 DIAGNOSIS — M79601 Pain in right arm: Secondary | ICD-10-CM | POA: Diagnosis not present

## 2015-01-04 DIAGNOSIS — Z5181 Encounter for therapeutic drug level monitoring: Secondary | ICD-10-CM | POA: Diagnosis not present

## 2015-01-05 ENCOUNTER — Other Ambulatory Visit: Payer: Self-pay | Admitting: *Deleted

## 2015-01-05 MED ORDER — METOPROLOL SUCCINATE ER 25 MG PO TB24: 25.0000 mg | ORAL_TABLET | Freq: Every day | ORAL | Status: DC

## 2015-01-08 ENCOUNTER — Other Ambulatory Visit: Payer: Self-pay | Admitting: Internal Medicine

## 2015-01-08 DIAGNOSIS — B20 Human immunodeficiency virus [HIV] disease: Secondary | ICD-10-CM

## 2015-01-08 NOTE — Telephone Encounter (Signed)
Lipitor 10mg  will be fine.

## 2015-01-11 DIAGNOSIS — M1A09X0 Idiopathic chronic gout, multiple sites, without tophus (tophi): Secondary | ICD-10-CM | POA: Diagnosis not present

## 2015-01-11 DIAGNOSIS — M0609 Rheumatoid arthritis without rheumatoid factor, multiple sites: Secondary | ICD-10-CM | POA: Diagnosis not present

## 2015-01-11 DIAGNOSIS — M79673 Pain in unspecified foot: Secondary | ICD-10-CM | POA: Diagnosis not present

## 2015-01-11 DIAGNOSIS — S80862A Insect bite (nonvenomous), left lower leg, initial encounter: Secondary | ICD-10-CM | POA: Diagnosis not present

## 2015-01-11 DIAGNOSIS — B880 Other acariasis: Secondary | ICD-10-CM | POA: Diagnosis not present

## 2015-01-11 DIAGNOSIS — Z955 Presence of coronary angioplasty implant and graft: Secondary | ICD-10-CM | POA: Diagnosis not present

## 2015-01-11 DIAGNOSIS — Z21 Asymptomatic human immunodeficiency virus [HIV] infection status: Secondary | ICD-10-CM | POA: Diagnosis not present

## 2015-01-11 DIAGNOSIS — C9110 Chronic lymphocytic leukemia of B-cell type not having achieved remission: Secondary | ICD-10-CM | POA: Diagnosis not present

## 2015-01-11 DIAGNOSIS — M150 Primary generalized (osteo)arthritis: Secondary | ICD-10-CM | POA: Diagnosis not present

## 2015-01-11 DIAGNOSIS — Z7982 Long term (current) use of aspirin: Secondary | ICD-10-CM | POA: Diagnosis not present

## 2015-01-11 DIAGNOSIS — L508 Other urticaria: Secondary | ICD-10-CM | POA: Diagnosis not present

## 2015-01-11 DIAGNOSIS — Z79899 Other long term (current) drug therapy: Secondary | ICD-10-CM | POA: Diagnosis not present

## 2015-01-11 DIAGNOSIS — F329 Major depressive disorder, single episode, unspecified: Secondary | ICD-10-CM | POA: Diagnosis not present

## 2015-01-11 DIAGNOSIS — S80861A Insect bite (nonvenomous), right lower leg, initial encounter: Secondary | ICD-10-CM | POA: Diagnosis not present

## 2015-01-17 ENCOUNTER — Emergency Department (HOSPITAL_COMMUNITY)
Admission: EM | Admit: 2015-01-17 | Discharge: 2015-01-17 | Disposition: A | Discharge status: Home / Self Care | Payer: Medicare Other | Attending: Emergency Medicine | Admitting: Emergency Medicine

## 2015-01-17 ENCOUNTER — Emergency Department (HOSPITAL_COMMUNITY): Payer: Medicare Other

## 2015-01-17 ENCOUNTER — Encounter (HOSPITAL_COMMUNITY): Payer: Self-pay | Admitting: *Deleted

## 2015-01-17 DIAGNOSIS — Z8673 Personal history of transient ischemic attack (TIA), and cerebral infarction without residual deficits: Secondary | ICD-10-CM | POA: Insufficient documentation

## 2015-01-17 DIAGNOSIS — R197 Diarrhea, unspecified: Secondary | ICD-10-CM | POA: Diagnosis not present

## 2015-01-17 DIAGNOSIS — Z79899 Other long term (current) drug therapy: Secondary | ICD-10-CM | POA: Diagnosis not present

## 2015-01-17 DIAGNOSIS — R109 Unspecified abdominal pain: Secondary | ICD-10-CM | POA: Diagnosis not present

## 2015-01-17 DIAGNOSIS — Z7982 Long term (current) use of aspirin: Secondary | ICD-10-CM | POA: Insufficient documentation

## 2015-01-17 DIAGNOSIS — K5730 Diverticulosis of large intestine without perforation or abscess without bleeding: Secondary | ICD-10-CM | POA: Diagnosis not present

## 2015-01-17 DIAGNOSIS — R112 Nausea with vomiting, unspecified: Secondary | ICD-10-CM | POA: Diagnosis not present

## 2015-01-17 DIAGNOSIS — Z86018 Personal history of other benign neoplasm: Secondary | ICD-10-CM | POA: Insufficient documentation

## 2015-01-17 DIAGNOSIS — I252 Old myocardial infarction: Secondary | ICD-10-CM | POA: Insufficient documentation

## 2015-01-17 DIAGNOSIS — K219 Gastro-esophageal reflux disease without esophagitis: Secondary | ICD-10-CM | POA: Insufficient documentation

## 2015-01-17 DIAGNOSIS — Z880 Allergy status to penicillin: Secondary | ICD-10-CM | POA: Insufficient documentation

## 2015-01-17 DIAGNOSIS — Z21 Asymptomatic human immunodeficiency virus [HIV] infection status: Secondary | ICD-10-CM | POA: Diagnosis not present

## 2015-01-17 DIAGNOSIS — D72829 Elevated white blood cell count, unspecified: Secondary | ICD-10-CM | POA: Diagnosis not present

## 2015-01-17 DIAGNOSIS — G8929 Other chronic pain: Secondary | ICD-10-CM | POA: Insufficient documentation

## 2015-01-17 DIAGNOSIS — Z8619 Personal history of other infectious and parasitic diseases: Secondary | ICD-10-CM | POA: Diagnosis not present

## 2015-01-17 DIAGNOSIS — I2510 Atherosclerotic heart disease of native coronary artery without angina pectoris: Secondary | ICD-10-CM | POA: Insufficient documentation

## 2015-01-17 DIAGNOSIS — Z951 Presence of aortocoronary bypass graft: Secondary | ICD-10-CM | POA: Insufficient documentation

## 2015-01-17 DIAGNOSIS — Z87448 Personal history of other diseases of urinary system: Secondary | ICD-10-CM | POA: Insufficient documentation

## 2015-01-17 DIAGNOSIS — E119 Type 2 diabetes mellitus without complications: Secondary | ICD-10-CM | POA: Insufficient documentation

## 2015-01-17 DIAGNOSIS — M6281 Muscle weakness (generalized): Secondary | ICD-10-CM | POA: Diagnosis not present

## 2015-01-17 DIAGNOSIS — M797 Fibromyalgia: Secondary | ICD-10-CM | POA: Insufficient documentation

## 2015-01-17 LAB — CBC WITH DIFFERENTIAL/PLATELET
Basophils Absolute: 0 10*3/uL (ref 0.0–0.1)
Basophils Relative: 0 % (ref 0–1)
Eosinophils Absolute: 0.5 10*3/uL (ref 0.0–0.7)
Eosinophils Relative: 3 % (ref 0–5)
HCT: 43.6 % (ref 39.0–52.0)
Hemoglobin: 14.2 g/dL (ref 13.0–17.0)
Lymphocytes Relative: 67 % — ABNORMAL HIGH (ref 12–46)
Lymphs Abs: 10.7 10*3/uL — ABNORMAL HIGH (ref 0.7–4.0)
MCH: 30 pg (ref 26.0–34.0)
MCHC: 32.6 g/dL (ref 30.0–36.0)
MCV: 92.2 fL (ref 78.0–100.0)
Monocytes Absolute: 1.4 10*3/uL — ABNORMAL HIGH (ref 0.1–1.0)
Monocytes Relative: 9 % (ref 3–12)
Neutro Abs: 3.4 10*3/uL (ref 1.7–7.7)
Neutrophils Relative %: 21 % — ABNORMAL LOW (ref 43–77)
Platelets: 133 10*3/uL — ABNORMAL LOW (ref 150–400)
RBC: 4.73 MIL/uL (ref 4.22–5.81)
RDW: 15.1 % (ref 11.5–15.5)
WBC: 16 10*3/uL — ABNORMAL HIGH (ref 4.0–10.5)

## 2015-01-17 LAB — COMPREHENSIVE METABOLIC PANEL
ALT: 24 U/L (ref 17–63)
AST: 30 U/L (ref 15–41)
Albumin: 3.4 g/dL — ABNORMAL LOW (ref 3.5–5.0)
Alkaline Phosphatase: 85 U/L (ref 38–126)
Anion gap: 5 (ref 5–15)
BUN: 20 mg/dL (ref 6–20)
CO2: 24 mmol/L (ref 22–32)
Calcium: 8.1 mg/dL — ABNORMAL LOW (ref 8.9–10.3)
Chloride: 110 mmol/L (ref 101–111)
Creatinine, Ser: 1.04 mg/dL (ref 0.61–1.24)
GFR calc Af Amer: >60 mL/min (ref >60)
GFR calc non Af Amer: >60 mL/min (ref >60)
Glucose, Bld: 87 mg/dL (ref 65–99)
Potassium: 3.7 mmol/L (ref 3.5–5.1)
Sodium: 139 mmol/L (ref 135–145)
Total Bilirubin: 0.9 mg/dL (ref 0.3–1.2)
Total Protein: 5.8 g/dL — ABNORMAL LOW (ref 6.5–8.1)

## 2015-01-17 LAB — CBC
HCT: 44.8 % (ref 39.0–52.0)
Hemoglobin: 14.5 g/dL (ref 13.0–17.0)
MCH: 29.9 pg (ref 26.0–34.0)
MCHC: 32.4 g/dL (ref 30.0–36.0)
MCV: 92.4 fL (ref 78.0–100.0)
Platelets: 173 10*3/uL (ref 150–400)
RBC: 4.85 MIL/uL (ref 4.22–5.81)
RDW: 15.2 % (ref 11.5–15.5)
WBC: 17 10*3/uL — ABNORMAL HIGH (ref 4.0–10.5)

## 2015-01-17 LAB — URINALYSIS, ROUTINE W REFLEX MICROSCOPIC
Glucose, UA: NEGATIVE mg/dL
Hgb urine dipstick: NEGATIVE
Ketones, ur: NEGATIVE mg/dL
Leukocytes, UA: NEGATIVE
Nitrite: NEGATIVE
Protein, ur: NEGATIVE mg/dL
Specific Gravity, Urine: 1.026 (ref 1.005–1.030)
Urobilinogen, UA: 1 mg/dL (ref 0.0–1.0)
pH: 6 (ref 5.0–8.0)

## 2015-01-17 LAB — LIPASE, BLOOD: Lipase: 23 U/L (ref 22–51)

## 2015-01-17 MED ORDER — IOHEXOL 300 MG/ML  SOLN
100.0000 mL | Freq: Once | INTRAMUSCULAR | Status: AC | PRN
  Administered 2015-01-17: 100 mL via INTRAVENOUS

## 2015-01-17 MED ORDER — IOHEXOL 300 MG/ML  SOLN
25.0000 mL | Freq: Once | INTRAMUSCULAR | Status: DC | PRN
  Administered 2015-01-17: 25 mL via ORAL
  Filled 2015-01-17: qty 30

## 2015-01-17 MED ORDER — SODIUM CHLORIDE 0.9 % IV BOLUS (SEPSIS)
1000.0000 mL | Freq: Once | INTRAVENOUS | Status: AC
  Administered 2015-01-17: 1000 mL via INTRAVENOUS

## 2015-01-17 MED ORDER — ONDANSETRON HCL 4 MG/2ML IJ SOLN
4.0000 mg | Freq: Once | INTRAMUSCULAR | Status: DC
  Filled 2015-01-17: qty 2

## 2015-01-17 NOTE — ED Notes (Signed)
Pt attempting to give urine sample

## 2015-01-17 NOTE — ED Provider Notes (Signed)
CSN: [id number]     Arrival date & time 01/17/15  1121 History   First MD Initiated Contact with Patient 01/17/15 1204     Chief Complaint  Patient presents with  . Diarrhea     (Consider location/radiation/quality/duration/timing/severity/associated sxs/prior Treatment) HPI   64 year old male with history of HIV, rheumatoid arthritis, chronic back pain, diverticulosis, GERD presenting for evaluation of persistent diarrhea. Patient states for the past week he has had recurrent multiple episodes of nonbloody non-mucousy diarrhea. Symptoms getting progressively worse and now he is having 15-18 episodes of day. He felt dehydrated, generalized weak, having nausea and decrease in appetite. Abdominal discomfort and worsening with having bowel movement. 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 He is not on any antibiotic and no recent antibiotic use. His HIV is well controlled with viral load undetectable, and his CD4 1260 recently checked by Dr. [name]. Patient denies having severe headache, fever, chills, chest pain, difficulty breathing, back pain, dysuria, focal numbness or weakness, or rash. He had a recent colonoscopy less than 2 years ago and states that it was normal.         Past Medical History  Diagnosis Date  . Osteoarthritis, knee     s/p B TKA  . HIV infection dx 1993  . TIA (transient ischemic attack) 1997    mild residual L mouth droop  . H/O hiatal hernia 2008    surgery  . Gynecomastia, male   . Impotence of organic origin   . Rheumatoid arthritis(714.0) dx 2010    MTX, follows with rheum  . Gout   . Chronic back pain     follows with Nsurg  . Coronary artery disease 2010    s/p CABG '10, sees Dr. Percival Spanish  . Seasonal allergies   . Diverticulosis   . Gallstones   . Status post dilation of esophageal narrowing   . GERD (gastroesophageal reflux disease)   . Diverticulosis    . Esophagitis   . Hemorrhoids   . Tubular adenoma of colon   . Myocardial infarction 2010     x 2  . Hypertension   . DVT, lower extremity, recurrent 2008, 2009    LLE, chronic anticoag since 2009  . Carotid artery occlusion     40-60% right ICA stenosis (09/2008)  . Neuromuscular disorder     neuropathy  . Fibromyalgia   . Hepatitis A yrs ago  . CLL (chronic lymphoblastic leukemia) dx 2010    Followed at [facility] q40mo, no current therapy   . Secondary syphilis 07/24/14 dx    s/p 2 wks doxy   Past Surgical History  Procedure Laterality Date  . Spine surgery  2010    "rod and screws", "failed", lopwer spine,   . Cholecystectomy    . Hiatal hernia repair      wrap  . Shoulder surgery Left   . Mandible surgery Bilateral     tmj  . Varicose vein      stripping  . Replacement total knee Bilateral   . Knee arthroplasty  07/22/2011    Procedure: COMPUTER ASSISTED TOTAL KNEE ARTHROPLASTY;  Surgeon: Meredith Pel, MD;  Location: [place];  Service: Orthopedics;  Laterality: Right;  Right total knee arthroplasty  . Coronary artery bypass graft  2010    triple bypass  . Hardware removal N/A 07/02/2012    Procedure: HARDWARE REMOVAL;  Surgeon: Elaina Hoops,  MD;  Location: [facility];  Service: Neurosurgery;  Laterality: N/A;  . Inguinal hernia repair Bilateral   . Umbilical hernia repair      x 1  . Rotator cuff repair Right   . Esophagogastroduodenoscopy (egd) with propofol N/A 12/28/2012    Procedure: ESOPHAGOGASTRODUODENOSCOPY (EGD) WITH PROPOFOL;  Surgeon: Jerene Bears, MD;  Location: [facility];  Service: Gastroenterology;  Laterality: N/A;  . Colonoscopy with propofol N/A 12/28/2012    Procedure: COLONOSCOPY WITH PROPOFOL;  Surgeon: Jerene Bears, MD;  Location: [facility];  Service: Gastroenterology;  Laterality: N/A;  . Joint replacement Left 1999  . Tonsillectomy    . Ring around testicle hernia reapir  184 and 1986    x 2  . Esophagogastroduodenoscopy (egd) with propofol  N/A 03/15/2013    Procedure: ESOPHAGOGASTRODUODENOSCOPY (EGD) WITH PROPOFOL;  Surgeon: Jerene Bears, MD;  Location: [facility];  Service: Gastroenterology;  Laterality: N/A;   Family History  Problem Relation Age of Onset  . Breast cancer Mother   . Hypertension Mother   . Hyperlipidemia Mother   . Diabetes Mother   . Prostate cancer Father   . Colon polyps Father   . Hyperlipidemia Father   . Crohn's disease Paternal Aunt   . Diabetes Maternal Grandmother   . Diabetes Brother     x 3  . Heart disease Brother     x 3  . Hyperlipidemia Brother     x 3  . Colon cancer Neg Hx   . Alcohol abuse Daughter   . Drug abuse Daughter    Social History  Substance Use Topics  . Smoking status: Never Smoker   . Smokeless tobacco: Never Used     Comment: occ wine  . Alcohol Use: Yes     Comment: occasional wine- 1-2 per week    Review of Systems  All other systems reviewed and are negative.     Allergies  Morphine; Other; Peanut-containing drug products; Penicillins; Oxycodone-acetaminophen; and Promethazine hcl  Home Medications   Prior to Admission medications   Medication Sig Start Date End Date Taking? Authorizing Provider  aspirin EC 81 MG tablet Take 81 mg by mouth daily.    Historical Provider, MD  atorvastatin (LIPITOR) 10 MG tablet Take 1 tablet (10 mg total) by mouth at bedtime. 11/30/14   [name], MD  EPINEPHrine (EPIPEN 2-PAK) 0.3 mg/0.3 mL IJ SOAJ injection Inject 0.3 mLs (0.3 mg total) into the muscle once. 06/12/14   Rowe Clack, MD  escitalopram (LEXAPRO) 10 MG tablet Take 1 tablet (10 mg total) by mouth daily. 05/26/14 05/26/15  Rowe Clack, MD  GENVOYA 150-150-200-10 MG TABS tablet TAKE 1 TABLET BY MOUTH DAILY WITH BREAKFAST. 01/08/15   Thayer Headings, MD  inFLIXimab (REMICADE) 100 MG injection Inject into the vein every 6 (six) weeks.     Historical Provider, MD  loratadine (CLARITIN) 10 MG tablet TAKE 1 TABLET EVERY DAY 08/24/14   [name], MD  metoprolol succinate (TOPROL-XL) 25 MG 24 hr tablet Take 1 tablet (25 mg total) by mouth at bedtime. 01/05/15   [name], MD  NASONEX 50 MCG/ACT nasal spray USE 2 SPRAYS IN [facility] NOSTRIL ONCE A DAY 08/28/14   [name], MD  ondansetron (ZOFRAN ODT) 4 MG disintegrating tablet 4mg  ODT q6 hours prn nausea/vomit 07/11/14   Thayer Headings, MD  pantoprazole (PROTONIX) 40 MG tablet TAKE 1 TABLET EVERY DAY 06/19/14   [name], MD  pregabalin (LYRICA) 200 MG capsule Take 1 capsule (200 mg total) by mouth 2 (two) times daily. 07/11/14   Thayer Headings, MD  PREZISTA 800 MG tablet TAKE 1 TABLET (800 MG TOTAL) BY MOUTH DAILY. 06/12/14   Carlyle Basques, MD  saccharomyces boulardii (FLORASTOR) 250 MG capsule Take 1 capsule (250 mg total) by mouth 2 (two) times daily. 04/15/13   Jerene Bears, MD  traMADol (ULTRAM) 50 MG tablet Take 50-100 mg by mouth every 6 (six) hours as needed (for pain).    Historical Provider, MD  valACYclovir (VALTREX) 500 MG tablet TAKE 1 TABLET BY MOUTH EVERY DAY 03/08/14   Thayer Headings, MD  warfarin (COUMADIN) 10 MG tablet TAKE 1/2 ON [facility] AND 1 TABLET ALL OTHER DAYS 11/16/14   [name], MD  zolpidem (AMBIEN) 5 MG tablet Take 1 tablet (5 mg total) by mouth at bedtime as needed for sleep. 11/10/13   Biagio Borg, MD   BP 106/66 mmHg  Pulse 73  Temp(Src) 98 F (36.7 C) (Oral)  Resp 18  SpO2 98% Physical Exam  Constitutional: He is oriented to person, place, and time. He appears well-developed and well-nourished. No distress.  HENT:  Head: Atraumatic.  Mouth is dry.  Eyes: Conjunctivae are normal.  Neck: Neck supple.  No nuchal rigidity  Cardiovascular: Normal rate and regular rhythm.   Pulmonary/Chest: Effort normal and breath sounds normal. He exhibits no tenderness.  Abdominal: Soft. Bowel sounds are normal. He exhibits no distension. There is no tenderness.  Musculoskeletal: He exhibits no tenderness.  Neurological: He is alert and oriented to  person, place, and time.  Skin: Skin is dry. No rash noted.  Psychiatric: He has a normal mood and affect.  Nursing note and vitals reviewed.   ED Course  Procedures (including critical care time)  Patient here with persistent diarrhea and having vague abdominal pain. On examination abdomen is mildly tender diffusely but no focal point tenderness and no peritoneal sign. Is afebrile with stable normal vital sign. He does appears dehydrated. He does have an elevated white count of 17.0. History of diverticulosis, therefore I will obtain abdominal and pelvis CT scan for further evaluation. No recent antibiotic use concerning for C. difficile. Care discussed with Dr. Mingo Amber.  Pain medication offered, pt declined.  IVF given.    4:17 PM Labs review is remarkable for leukocytosis with WBC 17.0 without left shift. The remainder of his labs are reassuring. No significant electrolytes abnormalities. Urine shows no signs of urinary tract infection. His abdominal and pelvis CT scan show evidence of sigmoid colonic diverticulosis without evidence of diverticulitis. There is slight interval progression so visualized lower mediastinal as well as retroperitoneal adenopathy most compatible with patient's history of CLL. Patient does admits that he has history of CLL. There also mild central intrahepatic biliary ductal dilatation potentially secondary to post cholecystomy state. Patient has normal LFT.  At this time patient felt better after receiving IV fluid. He has not had any bowel movement today. No bowel movement in the ED. He does have a primary care provider that he can follow-up closely. Return precautions discussed.  Labs Review Labs Reviewed  COMPREHENSIVE METABOLIC PANEL - Abnormal; Notable for the following:    Calcium 8.1 (*)    Total Protein 5.8 (*)    Albumin 3.4 (*)    All other components within normal limits  CBC - Abnormal; Notable for the following:    WBC 17.0 (*)    All other  components within normal limits  URINALYSIS, ROUTINE W REFLEX MICROSCOPIC (NOT AT [facility]) - Abnormal; Notable for the following:    Color, Urine AMBER (*)    Bilirubin Urine SMALL (*)    All other components within normal limits  CBC WITH DIFFERENTIAL/PLATELET - Abnormal; Notable for the following:    WBC 16.0 (*)    Platelets 133 (*)    Neutrophils Relative % 21 (*)    Lymphocytes Relative 67 (*)    Lymphs Abs 10.7 (*)    Monocytes Absolute 1.4 (*)    All other components within normal limits  LIPASE, BLOOD    Imaging Review Ct Abdomen Pelvis W Contrast  01/17/2015   CLINICAL DATA:  Patient with history of CLL. Diarrhea and nausea. Vomiting.  EXAM: CT ABDOMEN AND PELVIS WITH CONTRAST  TECHNIQUE: Multidetector CT imaging of the abdomen and pelvis was performed using the standard protocol following bolus administration of intravenous contrast.  CONTRAST:  139mL OMNIPAQUE IOHEXOL 300 MG/ML  SOLN  COMPARISON:  CT abdomen pelvis 05/19/2013  FINDINGS: Lower chest: Normal heart size. Extensive coronary arterial vascular calcifications. Status post median sternotomy and CABG procedure. Multiple partially calcified bilateral hilar and subcarinal lymph nodes are demonstrated. There is interval increase in size of a right subcarinal lymph node measuring 2.3 cm (image 3; series 2), previously 1.9 cm. There is a small hiatal hernia. Dependent ground-glass opacities the bilateral lower lobes may represent atelectasis.  Hepatobiliary: Multiple punctate calcifications are demonstrated within the liver. Patient status post cholecystectomy. Mild central intrahepatic biliary ductal dilatation. Common bile duct is unremarkable. No focal hepatic lesion is identified.  Pancreas: Mild fatty atrophy of the pancreatic parenchyma.  Spleen: Multiple calcified granulomata within the spleen.  Adrenals/Urinary Tract: The adrenal glands are normal. The kidneys enhance symmetrically with contrast. Urinary bladder is unremarkable.  Prostate is enlarged.  Stomach/Bowel: Sigmoid colonic diverticulosis. No CT evidence for acute diverticulitis. Oral contrast material is demonstrated within the proximal small bowel. There are no gaseous distended loops of small bowel to suggest obstruction. No free fluid or free intraperitoneal air.  Vascular/Lymphatic: Re- demonstrated extensive retroperitoneal adenopathy predominantly located at the level of the infrarenal abdominal aorta. Reference left periaortic lymph node (image 45; series 2) measures 2.2 x 3.2 cm, previously 2.7 x 1.9 cm. Reference lymph node anterior to the aorta measures 2.2 cm (image 43; series 2), previously 2.3 cm. Tortuosity of the abdominal aorta which is otherwise normal in caliber.  Other: None  Musculoskeletal: Lower thoracic and lumbar spine degenerative changes. Lumbar spinal fusion hardware. No aggressive or acute appearing osseous lesions.  IMPRESSION: Slight interval progression of visualized lower mediastinal as well as retroperitoneal adenopathy, most compatible with patient's history of CLL.  Mild central intrahepatic biliary ductal dilatation, potentially secondary to post cholecystectomy state. Recommend correlation with LFTs.  Sigmoid colonic diverticulosis without evidence for acute diverticulitis.   Electronically Signed   By: Lovey Newcomer M.D.   On: 01/17/2015 16:09   I have personally reviewed and evaluated these images and lab results as part of my medical decision-making.    MDM   Final diagnoses:  Diarrhea  Leukocytosis    BP 117/64 mmHg  Pulse 68  Temp(Src) 98 F (36.7 C) (Oral)  Resp 18  SpO2 98%  I have reviewed nursing notes and vital signs. I personally viewed the imaging tests through PACS system and agrees with radiologist's intepretation I reviewed available ER/hospitalization records through the EMR     Domenic Moras, PA-C 01/17/15 1624  Evelina Bucy, MD 01/17/15 [phone]

## 2015-01-17 NOTE — Discharge Instructions (Signed)

## 2015-01-17 NOTE — ED Notes (Signed)
Patient transported to CT 

## 2015-01-17 NOTE — ED Notes (Signed)
Pt sates that he has had N/V/D since Wednesday. Pt states that he has taken OTC medication with no relief. Pt states that he has a headache as well today. Pt states that he also has chigger bites to his legs and was taking a medication for that as well.

## 2015-01-18 ENCOUNTER — Telehealth: Payer: Self-pay | Admitting: *Deleted

## 2015-01-18 NOTE — Telephone Encounter (Signed)
Seen in [facility] ED 01/17/15 for watery diarrhea x 7 days.  Pt was dehydrated at the ED and was given fluids.  Pt was discharged home.   Pt has had 5 diarrheal stools with nausea today.  Worried about these continuing symptoms.  Requesting to be seen.  Pt is willing to return tomorrow to see Dr. [name].  Pt did have blood work at the ED which revealed a WBC of 16.0.  Pt scheduled for 01/19/15 @ 10:45.

## 2015-01-18 NOTE — Telephone Encounter (Addendum)
Patient at [facility] ED 9/7 for 1 week of diarrhea. He called today asking for additional advice.  He was advised at ED that his labs looked ok, to take immodium for diarrhea, but that is not helping.  Patient feels weak, would like to know what else he could take to help.  Please advise. Landis Gandy, RN

## 2015-01-19 ENCOUNTER — Ambulatory Visit (INDEPENDENT_AMBULATORY_CARE_PROVIDER_SITE_OTHER): Payer: Medicare Other | Admitting: Infectious Diseases

## 2015-01-19 ENCOUNTER — Other Ambulatory Visit: Payer: Self-pay | Admitting: *Deleted

## 2015-01-19 ENCOUNTER — Encounter: Payer: Self-pay | Admitting: Infectious Diseases

## 2015-01-19 VITALS — BP 125/72 | HR 70 | Temp 97.7°F | Wt 181.0 lb

## 2015-01-19 DIAGNOSIS — B20 Human immunodeficiency virus [HIV] disease: Secondary | ICD-10-CM | POA: Diagnosis not present

## 2015-01-19 DIAGNOSIS — E118 Type 2 diabetes mellitus with unspecified complications: Secondary | ICD-10-CM

## 2015-01-19 DIAGNOSIS — R197 Diarrhea, unspecified: Secondary | ICD-10-CM

## 2015-01-19 DIAGNOSIS — I2510 Atherosclerotic heart disease of native coronary artery without angina pectoris: Secondary | ICD-10-CM | POA: Diagnosis not present

## 2015-01-19 DIAGNOSIS — I82409 Acute embolism and thrombosis of unspecified deep veins of unspecified lower extremity: Secondary | ICD-10-CM

## 2015-01-19 LAB — POCT INR: INR: 5.9

## 2015-01-19 MED ORDER — DIPHENOXYLATE-ATROPINE 2.5-0.025 MG PO TABS: 1.0000 | ORAL_TABLET | Freq: Four times a day (QID) | ORAL | Status: DC | PRN

## 2015-01-19 MED ORDER — UREA 10 % EX CREA: TOPICAL_CREAM | CUTANEOUS | Status: DC | PRN

## 2015-01-19 MED ORDER — METRONIDAZOLE 500 MG PO TABS: 500.0000 mg | ORAL_TABLET | Freq: Three times a day (TID) | ORAL | Status: DC

## 2015-01-19 NOTE — Progress Notes (Signed)
   Subjective:    Patient ID: [name], male    DOB: 12-04-1950, 64 y.o.   MRN: [id number]  HPI  64 yo M with HIV+ on genvoya, DRV. Was treated for syphilis with doxy after a skin rash (07-2014). Has had f/u at health dept and his RPR has been dropping.  Also has a hx of CLL  Over last 6 weeks has been having diarrhea, up to 9x/day. He has had his food and liquids going straight through him. He was seen in ED 9-7, WBC 16.0,  and had CT:  Slight interval progression of visualized lower mediastinal as well as retroperitoneal adenopathy, most compatible with patient's history of CLL.   Mild central intrahepatic biliary ductal dilatation, potentially secondary to post cholecystectomy state. Recommend correlation with LFTs.   Sigmoid colonic diverticulosis without evidence for acute diverticulitis.  Has been taking imodium.,  Anorexia. No fever or chills.  No blood in BM. 9 # wt loss.  Weird dreams  HIV 1 RNA QUANT (copies/mL)  Date Value  11/20/2014 49*  08/10/2014 31*  06/15/2014 <20   CD4 T CELL ABS (/uL)  Date Value  11/20/2014 1400  08/10/2014 1240  06/15/2014 800     Review of Systems  Constitutional: Positive for appetite change, fatigue and unexpected weight change. Negative for fever and chills.  Gastrointestinal: Positive for diarrhea. Negative for constipation and blood in stool.  Genitourinary: Negative for difficulty urinating.  Neurological: Negative for dizziness and light-headedness.       Objective:   Physical Exam  Constitutional: He appears well-developed and well-nourished.  HENT:  Mouth/Throat: No oropharyngeal exudate.  Mildly dry  Eyes: EOM are normal. Pupils are equal, round, and reactive to light.  Neck: Neck supple.  Cardiovascular: Normal rate and regular rhythm.   Pulmonary/Chest: Effort normal and breath sounds normal.  Abdominal: Soft. Bowel sounds are normal. He exhibits no distension. There is no tenderness.  Musculoskeletal: He  exhibits no edema.  Multiple bug bites, excoriations.  Large bruise, no fluctuance on R thigh.   Lymphadenopathy:    He has no cervical adenopathy.       Assessment & Plan:

## 2015-01-19 NOTE — Assessment & Plan Note (Signed)
Will check stool studies.  Will give him course of flagyl Asked him to increase fluids, avoid sweet (apple juice, gatorade) BRAT diet.   i have asked him to call over w/e if he is not improved.

## 2015-01-19 NOTE — Assessment & Plan Note (Signed)
Encouraged pt to check his sugar.

## 2015-01-19 NOTE — Assessment & Plan Note (Addendum)
He will check his INR more frequently while on flagyl/coumadin. I also asked him to check in with his PCP

## 2015-01-19 NOTE — Telephone Encounter (Signed)
Rx called in 

## 2015-01-19 NOTE — Assessment & Plan Note (Addendum)
No change in his current art.  His CD4 was above threshold for OI for GI problems.

## 2015-01-22 ENCOUNTER — Telehealth: Payer: Self-pay | Admitting: *Deleted

## 2015-01-22 ENCOUNTER — Ambulatory Visit (INDEPENDENT_AMBULATORY_CARE_PROVIDER_SITE_OTHER): Payer: Medicare Other | Admitting: General Practice

## 2015-01-22 ENCOUNTER — Other Ambulatory Visit: Payer: Self-pay | Admitting: Internal Medicine

## 2015-01-22 ENCOUNTER — Other Ambulatory Visit: Payer: Medicare Other

## 2015-01-22 DIAGNOSIS — R197 Diarrhea, unspecified: Secondary | ICD-10-CM

## 2015-01-22 DIAGNOSIS — B20 Human immunodeficiency virus [HIV] disease: Secondary | ICD-10-CM

## 2015-01-22 LAB — POCT INR: INR: 1.9

## 2015-01-22 NOTE — Telephone Encounter (Signed)
Patient came to clinic and advised he is still having sever diarrhea and abdominal pain. He advised is slowed on Saturday but yesterday and today it is back and more painful than before. Advised he feels bad enogh to go to the hospital. Advised him to let me give the doctor a call and I will let him know what he thinks.   Paged the doctor and patient is advised to be checked at Urgent care or the local ED. Patient informed and on his way.

## 2015-01-22 NOTE — Addendum Note (Signed)
Addended by: Lorne Skeens D on: 01/22/2015 05:21 PM   Modules accepted: Orders

## 2015-01-22 NOTE — Progress Notes (Signed)
Pre visit review using our clinic review tool, if applicable. No additional management support is needed unless otherwise documented below in the visit note. 

## 2015-01-22 NOTE — Progress Notes (Signed)
Agree with Coumadin management 

## 2015-01-23 ENCOUNTER — Other Ambulatory Visit: Payer: Self-pay | Admitting: Internal Medicine

## 2015-01-23 DIAGNOSIS — G609 Hereditary and idiopathic neuropathy, unspecified: Secondary | ICD-10-CM

## 2015-01-23 LAB — CLOSTRIDIUM DIFFICILE BY PCR

## 2015-01-26 ENCOUNTER — Telehealth: Payer: Self-pay | Admitting: Internal Medicine

## 2015-01-26 ENCOUNTER — Telehealth: Payer: Self-pay | Admitting: *Deleted

## 2015-01-26 LAB — STOOL CULTURE

## 2015-01-26 NOTE — Telephone Encounter (Signed)
Patient called for stool study results - all negative.  Patient still experiencing episodic diarrhea, though he states it is less watery now.  He denies any difficulty eating/drinking, no nausea or vomiting.  He has been seen in the past at [facility] (Dr. Hilarie Fredrickson) and wonders if this would be the next step.  RN advised he could call them for an appointment to see.  RN also advised the patient to inquire at his PCP if a referral is necessary, due to the patient's insurance.  Christopher Gandy, RN

## 2015-01-26 NOTE — Telephone Encounter (Signed)
Pt states he has been having diarrhea for 3 weeks. Pt has been seen by ID and had several labs done and everything was normal. Pt states he was told to call GI to be seen to see if perhaps he has IBD. Pt scheduled to see [name] Esterwood PA 02/05/15@3pm . Pt aware of appt.

## 2015-01-29 ENCOUNTER — Telehealth: Payer: Self-pay | Admitting: Internal Medicine

## 2015-01-29 ENCOUNTER — Ambulatory Visit (INDEPENDENT_AMBULATORY_CARE_PROVIDER_SITE_OTHER): Payer: Medicare Other | Admitting: General Practice

## 2015-01-29 LAB — POCT INR: INR: 3.4

## 2015-01-29 NOTE — Telephone Encounter (Signed)
Pt was to call you back. Also wants you to know his I & R is  3.4

## 2015-01-29 NOTE — Progress Notes (Signed)
Pre visit review using our clinic review tool, if applicable. No additional management support is needed unless otherwise documented below in the visit note. 

## 2015-02-05 ENCOUNTER — Encounter: Payer: Self-pay | Admitting: Physician Assistant

## 2015-02-05 ENCOUNTER — Ambulatory Visit (INDEPENDENT_AMBULATORY_CARE_PROVIDER_SITE_OTHER): Payer: Medicare Other | Admitting: Physician Assistant

## 2015-02-05 VITALS — BP 110/60 | HR 72 | Ht 66.0 in | Wt 183.2 lb

## 2015-02-05 DIAGNOSIS — R197 Diarrhea, unspecified: Secondary | ICD-10-CM

## 2015-02-05 DIAGNOSIS — I2510 Atherosclerotic heart disease of native coronary artery without angina pectoris: Secondary | ICD-10-CM | POA: Diagnosis not present

## 2015-02-05 DIAGNOSIS — B20 Human immunodeficiency virus [HIV] disease: Secondary | ICD-10-CM

## 2015-02-05 DIAGNOSIS — K5909 Other constipation: Secondary | ICD-10-CM | POA: Diagnosis not present

## 2015-02-05 MED ORDER — DIPHENOXYLATE-ATROPINE 2.5-0.025 MG PO TABS: 1.0000 | ORAL_TABLET | Freq: Four times a day (QID) | ORAL | Status: DC | PRN

## 2015-02-05 MED ORDER — SACCHAROMYCES BOULARDII 250 MG PO CAPS: 250.0000 mg | ORAL_CAPSULE | Freq: Two times a day (BID) | ORAL | Status: DC

## 2015-02-05 NOTE — Progress Notes (Addendum)
Patient ID: [name], male   DOB: 1950/06/09, 64 y.o.   MRN: [id number]   Subjective:    Patient ID: [name], male    DOB: [birth year], 64 y.o.   MRN: [id number]  HPI [name] is a pleasant 64 year old white male known to Dr.Pyrtle from colonoscopy done 8 2014. This showed internal hemorrhoids and moderate diverticulosis, he had one 5 mm polyp removed which was a tubular adenoma. Patient is referred today by Dr. Hatcher/infectious disease with 6 week history of diarrhea. Patient says that initial onset he became abruptly ill with watery diarrhea and had at least 16 episodes in the first 10 hours. This was associated with nausea and decreased appetite. He lost about 9 pounds over one week. He also had abdominal cramping. There was no fever or chills no melena or hematochezia. He says he remained ill for about 10 days. During that time frame he had been seen by Dr. [name]. He had stool cultures done which were negative and then submitted a stool for C. difficile which was apparently too formed. He had was given an empiric course of Flagyl which she completed. His CD4 counts are adequate and he is felt to be above the threshold for opportunistic infections. He says he felt a little better for a while took Imodium when necessary and then yesterday started having pasty stools again 2-3 and today had an explosion of urgent liquid diarrhea. Again no complaints of cramping or pain no fever chills nausea etc. He says he has an unsettled urgency type of feeling. He had not been on any recent antibiotics, no new medications. He mentions that the day before he became ill he was bitten by a bunch of chiggers, he had also cleaned out in a trailer which was infested with rat feces.  Ct of abd/pelvis was done 01/17/2015 and shows slight interval progression of visualized lower mediastinal as well as retroperitoneal adenopathy most compatible with patient's history of CLL, sigmoid diverticulosis, mild central  intrahepatic biliary ductal dilation potentially secondary to postcholecystectomy state.  Review of Systems Pertinent positive and negative review of systems were noted in the above HPI section.  All other review of systems was otherwise negative.  Outpatient Encounter Prescriptions as of 02/05/2015  Medication Sig  . aspirin EC 81 MG tablet Take 81 mg by mouth daily.  [name] atorvastatin (LIPITOR) 10 MG tablet Take 1 tablet (10 mg total) by mouth at bedtime.  . diphenoxylate-atropine (LOMOTIL) 2.5-0.025 MG per tablet Take 1 tablet by mouth 4 (four) times daily as needed for diarrhea or loose stools.  [name] EPINEPHrine (EPIPEN 2-PAK) 0.3 mg/0.3 mL IJ SOAJ injection Inject 0.3 mLs (0.3 mg total) into the muscle once.  . escitalopram (LEXAPRO) 10 MG tablet Take 1 tablet (10 mg total) by mouth daily.  . GENVOYA 150-150-200-10 MG TABS tablet TAKE 1 TABLET BY MOUTH DAILY WITH BREAKFAST.  . inFLIXimab (REMICADE) 100 MG injection Inject into the vein every 6 (six) weeks.   [name] loratadine (CLARITIN) 10 MG tablet TAKE 1 TABLET EVERY DAY  . LYRICA 200 MG capsule TAKE 1 CAPSULE BY MOUTH TWICE DAILY  . metoprolol succinate (TOPROL-XL) 25 MG 24 hr tablet Take 1 tablet (25 mg total) by mouth at bedtime.  [name] NASONEX 50 MCG/ACT nasal spray USE 2 SPRAYS IN EACH NOSTRIL ONCE A DAY  . ondansetron (ZOFRAN ODT) 4 MG disintegrating tablet 4mg  ODT q6 hours prn nausea/vomit  . pantoprazole (PROTONIX) 40 MG tablet TAKE 1 TABLET EVERY DAY  .  PREZISTA 800 MG tablet TAKE 1 TABLET (800 MG TOTAL) BY MOUTH DAILY.  [name] saccharomyces boulardii (FLORASTOR) 250 MG capsule Take 1 capsule (250 mg total) by mouth 2 (two) times daily.  . traMADol (ULTRAM) 50 MG tablet Take 50-100 mg by mouth every 6 (six) hours as needed (for pain).  . urea (CARB-O-PHILIC/10) 10 % cream Apply topically as needed.  . valACYclovir (VALTREX) 500 MG tablet TAKE 1 TABLET BY MOUTH EVERY DAY  . warfarin (COUMADIN) 10 MG tablet TAKE 1/2 ON WEDNESDAY AND 1 TABLET ALL  OTHER DAYS  . zolpidem (AMBIEN) 5 MG tablet Take 1 tablet (5 mg total) by mouth at bedtime as needed for sleep.  . [DISCONTINUED] diphenoxylate-atropine (LOMOTIL) 2.5-0.025 MG per tablet Take 1 tablet by mouth 4 (four) times daily as needed for diarrhea or loose stools.  . [DISCONTINUED] saccharomyces boulardii (FLORASTOR) 250 MG capsule Take 1 capsule (250 mg total) by mouth 2 (two) times daily.  . [DISCONTINUED] metroNIDAZOLE (FLAGYL) 500 MG tablet Take 1 tablet (500 mg total) by mouth 3 (three) times daily.   No facility-administered encounter medications on file as of 02/05/2015.   Allergies  Allergen Reactions  . Morphine Other (See Comments)    REACTION: severe headache  . Other Anaphylaxis and Hives    Pecan  . Peanut-Containing Drug Products Anaphylaxis and Hives    Swelling of throat  . Penicillins Anaphylaxis and Rash    REACTION: red, flushed  . Oxycodone-Acetaminophen Other (See Comments)    REACTION: headache  . Promethazine Hcl Other (See Comments)    REACTION: makes him feel drunk   Patient Active Problem List   Diagnosis Date Noted  . Diarrhea 01/19/2015  . Injury of upper arm, right 12/18/2014  . Biceps muscle tear 12/18/2014  . Cellulitis of leg, left 11/27/2014  . Eczema, allergic 09/05/2014  . Secondary syphilis of skin 07/24/2014  . Analgesic rebound headache 07/13/2014  . Insomnia 11/11/2013  . GERD (gastroesophageal reflux disease)   . Hereditary and idiopathic peripheral neuropathy 09/08/2013  . Erosive esophagitis 12/28/2012  . Colon polyp 12/28/2012  . Depression 11/16/2012  . Dysphagia, unspecified(787.20) 09/22/2012  . Allergic rhinitis, cause unspecified 04/28/2012  . Long term current use of anticoagulant therapy 02/03/2012  . Hypertension   . DVT, lower extremity, recurrent   . Osteoarthritis, knee   . TIA (transient ischemic attack)   . Gout   . Chronic back pain   . Gynecomastia, male 11/19/2011  . Carotid artery occlusion   .  Hyperlipidemia with target LDL less than 100   . HIV disease 04/08/2011  . Rheumatoid arthritis 04/08/2011  . Chronic lymphoblastic leukemia 04/08/2011  . Type II diabetes mellitus with manifestations 04/02/2011  . Impotence of organic origin 04/02/2011  . CAD, NATIVE VESSEL 02/14/2009   Social History   Social History  . Marital Status: Widowed    Spouse Name: N/A  . Number of Children: 3  . Years of Education: N/A   Occupational History  . retired    Social History Main Topics  . Smoking status: Never Smoker   . Smokeless tobacco: Never Used     Comment: occ wine  . Alcohol Use: Yes     Comment: occasional wine- 1-2 per week  . Drug Use: No  . Sexual Activity: No     Comment: pt. given condoms   Other Topics Concern  . Not on file   Social History Narrative    Mr. Dildine's family history includes Alcohol abuse in  his daughter; Breast cancer in his mother; Colon polyps in his father; Crohn's disease in his paternal aunt; Diabetes in his brother, maternal grandmother, and mother; Drug abuse in his daughter; Heart disease in his brother; Hyperlipidemia in his brother, father, and mother; Hypertension in his mother; Prostate cancer in his father. There is no history of Colon cancer.      Objective:    Filed Vitals:   02/05/15 1525  BP: 110/60  Pulse: 72    Physical Exam  well-developed older white male in no acute distress, pleasant blood pressure 110/60 pulse 72 height 5 foot 6 weight 183 he walks with very stooped posture, HEENT ;nontraumatic normocephalic EOMI PERRLA sclera anicteric, Neck ;supple no JVD, Cardiovascular; regular rate and rhythm with S1-S2 no murmur or gallop, Pulmonary ;clear bilaterally, Abdomen; soft bowel sounds are somewhat hyperactive, nontender no palpable mass or hepatosplenomegaly, Rectal; exam not done, Extremities ;no clubbing cyanosis or edema skin warm and dry, Neuropsych ;mood and affect appropriate       Assessment & Plan:   #1 64  yo white male with HIV on Jenvoya with 5-6 week history of diarrhea. Suspect this was of infectious etiology with abrupt onset associated with nausea and anorexia and weight loss as well as abdominal cramping. Patient improved and has now had recurrent diarrhea 2 days much less severe than at initial onset. Empiric course of metronidazole of questionable benefit. Initial stool cultures negative C. difficile not done due to consistency of stool  R/o persistent infection, R/O  post infectious IBS  #2 hx of adenomatous polyp 2014 #3 hx recurrent DVT's -on Coumadin/bridges with Lovenox #4 AODM #5 CLL #6 s/p GB  Plan; GI pathogen panel Start Florastor BID  prn lomotil  If Gi path panel negative and Diarrhea persists then will need colonoscopy with Dr Hilarie Fredrickson  He would need to come off coumadin(DR [name]) and bridge with Lovenox   Latori Beggs S Shaneka Efaw PA-C 02/05/2015   Cc: [name], MD  Addendum: Reviewed and agree with initial management. Jerene Bears, MD

## 2015-02-05 NOTE — Patient Instructions (Addendum)
Please go to the basement level to our lab for stool studies. We faxed a prescription to [facility] [address]. Take Florastor twice daily for one month. You can get this at your pharmacy. [facility] [address].  You may use Lomotil as needed after the stool speciman is turned in.

## 2015-02-08 ENCOUNTER — Other Ambulatory Visit: Payer: Medicare Other

## 2015-02-08 DIAGNOSIS — R197 Diarrhea, unspecified: Secondary | ICD-10-CM

## 2015-02-08 DIAGNOSIS — M0589 Other rheumatoid arthritis with rheumatoid factor of multiple sites: Secondary | ICD-10-CM | POA: Diagnosis not present

## 2015-02-09 LAB — FECAL LACTOFERRIN, QUANT: Lactoferrin: POSITIVE

## 2015-02-12 ENCOUNTER — Ambulatory Visit (INDEPENDENT_AMBULATORY_CARE_PROVIDER_SITE_OTHER): Payer: Medicare Other | Admitting: General Practice

## 2015-02-12 DIAGNOSIS — Z7901 Long term (current) use of anticoagulants: Secondary | ICD-10-CM | POA: Diagnosis not present

## 2015-02-12 NOTE — Progress Notes (Signed)
Pre visit review using our clinic review tool, if applicable. No additional management support is needed unless otherwise documented below in the visit note. 

## 2015-02-13 DIAGNOSIS — E1142 Type 2 diabetes mellitus with diabetic polyneuropathy: Secondary | ICD-10-CM | POA: Diagnosis not present

## 2015-02-13 DIAGNOSIS — B351 Tinea unguium: Secondary | ICD-10-CM | POA: Diagnosis not present

## 2015-02-13 DIAGNOSIS — L84 Corns and callosities: Secondary | ICD-10-CM | POA: Diagnosis not present

## 2015-02-13 LAB — GASTROINTESTINAL PATHOGEN PANEL PCR
C. difficile Tox A/B, PCR: NEGATIVE
Campylobacter, PCR: NEGATIVE
Cryptosporidium, PCR: NEGATIVE
E coli (ETEC) LT/ST PCR: NEGATIVE
E coli (STEC) stx1/stx2, PCR: NEGATIVE
E coli 0157, PCR: NEGATIVE
Giardia lamblia, PCR: NEGATIVE
Norovirus, PCR: NEGATIVE
Rotavirus A, PCR: NEGATIVE
Salmonella, PCR: NEGATIVE
Shigella, PCR: NEGATIVE

## 2015-02-14 NOTE — Progress Notes (Signed)
I have reviewed and agree with the plan. 

## 2015-02-21 DIAGNOSIS — Z23 Encounter for immunization: Secondary | ICD-10-CM | POA: Diagnosis not present

## 2015-02-22 LAB — POCT INR: INR: 3.4

## 2015-02-23 ENCOUNTER — Ambulatory Visit (INDEPENDENT_AMBULATORY_CARE_PROVIDER_SITE_OTHER): Payer: Medicare Other | Admitting: General Practice

## 2015-02-23 NOTE — Progress Notes (Signed)
I have reviewed and agree with the plan. 

## 2015-02-23 NOTE — Progress Notes (Signed)
Pre visit review using our clinic review tool, if applicable. No additional management support is needed unless otherwise documented below in the visit note. 

## 2015-03-03 ENCOUNTER — Other Ambulatory Visit: Payer: Self-pay | Admitting: Internal Medicine

## 2015-03-05 LAB — POCT INR: INR: 2.1

## 2015-03-06 ENCOUNTER — Ambulatory Visit (INDEPENDENT_AMBULATORY_CARE_PROVIDER_SITE_OTHER): Payer: Medicare Other | Admitting: General Practice

## 2015-03-06 NOTE — Progress Notes (Signed)
Pre visit review using our clinic review tool, if applicable. No additional management support is needed unless otherwise documented below in the visit note. 

## 2015-03-06 NOTE — Progress Notes (Signed)
I have reviewed and agree with the plan. 

## 2015-03-13 ENCOUNTER — Other Ambulatory Visit: Payer: Self-pay

## 2015-03-13 MED ORDER — LORATADINE 10 MG PO TABS: 10.0000 mg | ORAL_TABLET | Freq: Every day | ORAL | Status: DC

## 2015-03-15 DIAGNOSIS — L821 Other seborrheic keratosis: Secondary | ICD-10-CM | POA: Diagnosis not present

## 2015-03-15 DIAGNOSIS — D225 Melanocytic nevi of trunk: Secondary | ICD-10-CM | POA: Diagnosis not present

## 2015-03-15 DIAGNOSIS — Z872 Personal history of diseases of the skin and subcutaneous tissue: Secondary | ICD-10-CM | POA: Diagnosis not present

## 2015-03-15 DIAGNOSIS — L570 Actinic keratosis: Secondary | ICD-10-CM | POA: Diagnosis not present

## 2015-03-16 DIAGNOSIS — M1A09X0 Idiopathic chronic gout, multiple sites, without tophus (tophi): Secondary | ICD-10-CM | POA: Diagnosis not present

## 2015-03-16 DIAGNOSIS — M0589 Other rheumatoid arthritis with rheumatoid factor of multiple sites: Secondary | ICD-10-CM | POA: Diagnosis not present

## 2015-03-24 LAB — POCT INR: INR: 2.2

## 2015-03-27 ENCOUNTER — Ambulatory Visit (INDEPENDENT_AMBULATORY_CARE_PROVIDER_SITE_OTHER): Payer: Medicare Other | Admitting: General Practice

## 2015-03-27 DIAGNOSIS — Z5181 Encounter for therapeutic drug level monitoring: Secondary | ICD-10-CM | POA: Diagnosis not present

## 2015-03-27 NOTE — Progress Notes (Signed)
Pre visit review using our clinic review tool, if applicable. No additional management support is needed unless otherwise documented below in the visit note. 

## 2015-03-27 NOTE — Progress Notes (Signed)
I have reviewed and agree with the plan. 

## 2015-03-29 DIAGNOSIS — M0589 Other rheumatoid arthritis with rheumatoid factor of multiple sites: Secondary | ICD-10-CM | POA: Diagnosis not present

## 2015-04-04 ENCOUNTER — Ambulatory Visit (INDEPENDENT_AMBULATORY_CARE_PROVIDER_SITE_OTHER): Payer: Medicare Other | Admitting: General Practice

## 2015-04-04 DIAGNOSIS — Z7901 Long term (current) use of anticoagulants: Secondary | ICD-10-CM | POA: Diagnosis not present

## 2015-04-04 LAB — POCT INR: INR: 2.2

## 2015-04-04 NOTE — Progress Notes (Signed)
I have reviewed and agree with the plan. 

## 2015-04-04 NOTE — Progress Notes (Signed)
Pre visit review using our clinic review tool, if applicable. No additional management support is needed unless otherwise documented below in the visit note. 

## 2015-04-08 ENCOUNTER — Other Ambulatory Visit: Payer: Self-pay | Admitting: Infectious Diseases

## 2015-04-08 ENCOUNTER — Other Ambulatory Visit: Payer: Self-pay | Admitting: Internal Medicine

## 2015-04-12 DIAGNOSIS — Z21 Asymptomatic human immunodeficiency virus [HIV] infection status: Secondary | ICD-10-CM | POA: Diagnosis not present

## 2015-04-12 DIAGNOSIS — M1A09X0 Idiopathic chronic gout, multiple sites, without tophus (tophi): Secondary | ICD-10-CM | POA: Diagnosis not present

## 2015-04-12 DIAGNOSIS — M0609 Rheumatoid arthritis without rheumatoid factor, multiple sites: Secondary | ICD-10-CM | POA: Diagnosis not present

## 2015-04-12 DIAGNOSIS — C9110 Chronic lymphocytic leukemia of B-cell type not having achieved remission: Secondary | ICD-10-CM | POA: Diagnosis not present

## 2015-04-12 DIAGNOSIS — M150 Primary generalized (osteo)arthritis: Secondary | ICD-10-CM | POA: Diagnosis not present

## 2015-04-12 DIAGNOSIS — M79673 Pain in unspecified foot: Secondary | ICD-10-CM | POA: Diagnosis not present

## 2015-04-19 ENCOUNTER — Ambulatory Visit (INDEPENDENT_AMBULATORY_CARE_PROVIDER_SITE_OTHER): Payer: Medicare Other | Admitting: Internal Medicine

## 2015-04-19 ENCOUNTER — Other Ambulatory Visit (INDEPENDENT_AMBULATORY_CARE_PROVIDER_SITE_OTHER): Payer: Medicare Other

## 2015-04-19 ENCOUNTER — Encounter: Payer: Self-pay | Admitting: Internal Medicine

## 2015-04-19 ENCOUNTER — Ambulatory Visit (INDEPENDENT_AMBULATORY_CARE_PROVIDER_SITE_OTHER)
Admission: RE | Admit: 2015-04-19 | Discharge: 2015-04-19 | Disposition: A | Discharge status: Home / Self Care | Payer: Medicare Other | Source: Ambulatory Visit | Attending: Internal Medicine | Admitting: Internal Medicine

## 2015-04-19 VITALS — BP 132/78 | HR 67 | Temp 98.3°F | Resp 16 | Ht 67.0 in | Wt 179.2 lb

## 2015-04-19 DIAGNOSIS — R059 Cough, unspecified: Secondary | ICD-10-CM

## 2015-04-19 DIAGNOSIS — I82403 Acute embolism and thrombosis of unspecified deep veins of lower extremity, bilateral: Secondary | ICD-10-CM

## 2015-04-19 DIAGNOSIS — I2510 Atherosclerotic heart disease of native coronary artery without angina pectoris: Secondary | ICD-10-CM | POA: Diagnosis not present

## 2015-04-19 DIAGNOSIS — I25700 Atherosclerosis of coronary artery bypass graft(s), unspecified, with unstable angina pectoris: Secondary | ICD-10-CM

## 2015-04-19 DIAGNOSIS — R05 Cough: Secondary | ICD-10-CM

## 2015-04-19 DIAGNOSIS — R0609 Other forms of dyspnea: Secondary | ICD-10-CM

## 2015-04-19 DIAGNOSIS — I82409 Acute embolism and thrombosis of unspecified deep veins of unspecified lower extremity: Secondary | ICD-10-CM | POA: Diagnosis not present

## 2015-04-19 DIAGNOSIS — I10 Essential (primary) hypertension: Secondary | ICD-10-CM

## 2015-04-19 DIAGNOSIS — R0600 Dyspnea, unspecified: Secondary | ICD-10-CM

## 2015-04-19 DIAGNOSIS — M0589 Other rheumatoid arthritis with rheumatoid factor of multiple sites: Secondary | ICD-10-CM | POA: Diagnosis not present

## 2015-04-19 DIAGNOSIS — J189 Pneumonia, unspecified organism: Secondary | ICD-10-CM

## 2015-04-19 LAB — BASIC METABOLIC PANEL
BUN: 20 mg/dL (ref 6–23)
CO2: 29 mEq/L (ref 19–32)
Calcium: 8.4 mg/dL (ref 8.4–10.5)
Chloride: 105 mEq/L (ref 96–112)
Creatinine, Ser: 1.05 mg/dL (ref 0.40–1.50)
GFR: 74.9 mL/min (ref >60.00)
Glucose, Bld: 72 mg/dL (ref 70–99)
Potassium: 3.7 mEq/L (ref 3.5–5.1)
Sodium: 140 mEq/L (ref 135–145)

## 2015-04-19 LAB — CARDIAC PANEL
CK-MB: 4.9 ng/mL — ABNORMAL HIGH (ref 0.3–4.0)
Relative Index: 5.3 calc — ABNORMAL HIGH (ref 0.0–2.5)
Total CK: 92 U/L (ref 7–232)

## 2015-04-19 LAB — BRAIN NATRIURETIC PEPTIDE: Pro B Natriuretic peptide (BNP): 189 pg/mL — ABNORMAL HIGH (ref 0.0–100.0)

## 2015-04-19 LAB — TROPONIN I: TNIDX: 0 ug/l (ref 0.00–0.06)

## 2015-04-19 NOTE — Progress Notes (Signed)
Pre visit review using our clinic review tool, if applicable. No additional management support is needed unless otherwise documented below in the visit note. 

## 2015-04-19 NOTE — Patient Instructions (Signed)

## 2015-04-20 ENCOUNTER — Emergency Department (HOSPITAL_COMMUNITY)
Admission: EM | Admit: 2015-04-20 | Discharge: 2015-04-20 | Disposition: A | Discharge status: Home / Self Care | Payer: Medicare Other | Attending: Emergency Medicine | Admitting: Emergency Medicine

## 2015-04-20 ENCOUNTER — Encounter (HOSPITAL_COMMUNITY): Payer: Self-pay | Admitting: *Deleted

## 2015-04-20 ENCOUNTER — Emergency Department (HOSPITAL_COMMUNITY): Payer: Medicare Other

## 2015-04-20 DIAGNOSIS — I252 Old myocardial infarction: Secondary | ICD-10-CM | POA: Insufficient documentation

## 2015-04-20 DIAGNOSIS — Z86718 Personal history of other venous thrombosis and embolism: Secondary | ICD-10-CM | POA: Diagnosis not present

## 2015-04-20 DIAGNOSIS — G8929 Other chronic pain: Secondary | ICD-10-CM | POA: Insufficient documentation

## 2015-04-20 DIAGNOSIS — Z8673 Personal history of transient ischemic attack (TIA), and cerebral infarction without residual deficits: Secondary | ICD-10-CM | POA: Insufficient documentation

## 2015-04-20 DIAGNOSIS — Z9889 Other specified postprocedural states: Secondary | ICD-10-CM | POA: Insufficient documentation

## 2015-04-20 DIAGNOSIS — I2510 Atherosclerotic heart disease of native coronary artery without angina pectoris: Secondary | ICD-10-CM | POA: Diagnosis not present

## 2015-04-20 DIAGNOSIS — M069 Rheumatoid arthritis, unspecified: Secondary | ICD-10-CM | POA: Insufficient documentation

## 2015-04-20 DIAGNOSIS — Z7982 Long term (current) use of aspirin: Secondary | ICD-10-CM | POA: Diagnosis not present

## 2015-04-20 DIAGNOSIS — Z880 Allergy status to penicillin: Secondary | ICD-10-CM | POA: Diagnosis not present

## 2015-04-20 DIAGNOSIS — G629 Polyneuropathy, unspecified: Secondary | ICD-10-CM | POA: Insufficient documentation

## 2015-04-20 DIAGNOSIS — J810 Acute pulmonary edema: Secondary | ICD-10-CM | POA: Insufficient documentation

## 2015-04-20 DIAGNOSIS — Z792 Long term (current) use of antibiotics: Secondary | ICD-10-CM | POA: Insufficient documentation

## 2015-04-20 DIAGNOSIS — R0602 Shortness of breath: Secondary | ICD-10-CM | POA: Diagnosis not present

## 2015-04-20 DIAGNOSIS — Z7901 Long term (current) use of anticoagulants: Secondary | ICD-10-CM | POA: Insufficient documentation

## 2015-04-20 DIAGNOSIS — Z79899 Other long term (current) drug therapy: Secondary | ICD-10-CM | POA: Diagnosis not present

## 2015-04-20 DIAGNOSIS — M179 Osteoarthritis of knee, unspecified: Secondary | ICD-10-CM | POA: Insufficient documentation

## 2015-04-20 DIAGNOSIS — E118 Type 2 diabetes mellitus with unspecified complications: Secondary | ICD-10-CM | POA: Diagnosis not present

## 2015-04-20 DIAGNOSIS — M797 Fibromyalgia: Secondary | ICD-10-CM | POA: Insufficient documentation

## 2015-04-20 DIAGNOSIS — Z856 Personal history of leukemia: Secondary | ICD-10-CM | POA: Insufficient documentation

## 2015-04-20 DIAGNOSIS — R05 Cough: Secondary | ICD-10-CM | POA: Diagnosis not present

## 2015-04-20 DIAGNOSIS — Z86018 Personal history of other benign neoplasm: Secondary | ICD-10-CM | POA: Insufficient documentation

## 2015-04-20 DIAGNOSIS — K219 Gastro-esophageal reflux disease without esophagitis: Secondary | ICD-10-CM | POA: Insufficient documentation

## 2015-04-20 DIAGNOSIS — B20 Human immunodeficiency virus [HIV] disease: Secondary | ICD-10-CM | POA: Insufficient documentation

## 2015-04-20 DIAGNOSIS — Z87438 Personal history of other diseases of male genital organs: Secondary | ICD-10-CM | POA: Insufficient documentation

## 2015-04-20 LAB — CBC WITH DIFFERENTIAL/PLATELET
Basophils Absolute: 0.1 K/uL (ref 0.0–0.1)
Basophils Relative: 0.4 % (ref 0.0–3.0)
Eosinophils Absolute: 0.3 K/uL (ref 0.0–0.7)
Eosinophils Relative: 1.7 % (ref 0.0–5.0)
HCT: 42.4 % (ref 39.0–52.0)
Hemoglobin: 13.8 g/dL (ref 13.0–17.0)
Lymphocytes Relative: 74.7 % — ABNORMAL HIGH (ref 12.0–46.0)
Lymphs Abs: 11.1 K/uL — ABNORMAL HIGH (ref 0.7–4.0)
MCHC: 32.5 g/dL (ref 30.0–36.0)
MCV: 86.6 fl (ref 78.0–100.0)
Monocytes Absolute: 1.5 K/uL — ABNORMAL HIGH (ref 0.1–1.0)
Monocytes Relative: 10.4 % (ref 3.0–12.0)
Neutro Abs: 1.9 K/uL (ref 1.4–7.7)
Neutrophils Relative %: 12.8 % — ABNORMAL LOW (ref 43.0–77.0)
Platelets: 172 K/uL (ref 150.0–400.0)
RBC: 4.89 Mil/uL (ref 4.22–5.81)
RDW: 16.5 % — ABNORMAL HIGH (ref 11.5–15.5)
WBC: 14.9 K/uL — ABNORMAL HIGH (ref 4.0–10.5)

## 2015-04-20 LAB — COMPREHENSIVE METABOLIC PANEL WITH GFR
ALT: 24 U/L (ref 17–63)
AST: 31 U/L (ref 15–41)
Albumin: 3.6 g/dL (ref 3.5–5.0)
Alkaline Phosphatase: 95 U/L (ref 38–126)
Anion gap: 6 (ref 5–15)
BUN: 17 mg/dL (ref 6–20)
CO2: 27 mmol/L (ref 22–32)
Calcium: 8.6 mg/dL — ABNORMAL LOW (ref 8.9–10.3)
Chloride: 107 mmol/L (ref 101–111)
Creatinine, Ser: 0.9 mg/dL (ref 0.61–1.24)
GFR calc Af Amer: >60 mL/min
GFR calc non Af Amer: >60 mL/min
Glucose, Bld: 87 mg/dL (ref 65–99)
Potassium: 4 mmol/L (ref 3.5–5.1)
Sodium: 140 mmol/L (ref 135–145)
Total Bilirubin: 0.9 mg/dL (ref 0.3–1.2)
Total Protein: 5.8 g/dL — ABNORMAL LOW (ref 6.5–8.1)

## 2015-04-20 LAB — I-STAT TROPONIN, ED: Troponin i, poc: 0 ng/mL (ref 0.00–0.08)

## 2015-04-20 LAB — CBC
HCT: 42.9 % (ref 39.0–52.0)
Hemoglobin: 13.6 g/dL (ref 13.0–17.0)
MCH: 28 pg (ref 26.0–34.0)
MCHC: 31.7 g/dL (ref 30.0–36.0)
MCV: 88.5 fL (ref 78.0–100.0)
Platelets: 138 10*3/uL — ABNORMAL LOW (ref 150–400)
RBC: 4.85 MIL/uL (ref 4.22–5.81)
RDW: 16.1 % — ABNORMAL HIGH (ref 11.5–15.5)
WBC: 12.7 10*3/uL — ABNORMAL HIGH (ref 4.0–10.5)

## 2015-04-20 LAB — D-DIMER, QUANTITATIVE: D-Dimer, Quant: 0.38 ug{FEU}/mL (ref 0.00–0.48)

## 2015-04-20 LAB — PROTIME-INR
INR: 2.98 — ABNORMAL HIGH (ref 0.00–1.49)
Prothrombin Time: 30.4 seconds — ABNORMAL HIGH (ref 11.6–15.2)

## 2015-04-20 LAB — BRAIN NATRIURETIC PEPTIDE: B Natriuretic Peptide: 164.7 pg/mL — ABNORMAL HIGH (ref 0.0–100.0)

## 2015-04-20 MED ORDER — FUROSEMIDE 20 MG PO TABS
20.0000 mg | ORAL_TABLET | Freq: Every day | ORAL | Status: DC
Start: 2015-04-20 — End: 2015-04-26

## 2015-04-20 MED ORDER — LEVOFLOXACIN 500 MG PO TABS
500.0000 mg | ORAL_TABLET | Freq: Every day | ORAL | Status: DC
Start: 2015-04-20 — End: 2015-04-26

## 2015-04-20 MED ORDER — FUROSEMIDE 20 MG PO TABS
20.0000 mg | ORAL_TABLET | Freq: Once | ORAL | Status: AC
  Administered 2015-04-20: 20 mg via ORAL
  Filled 2015-04-20: qty 1

## 2015-04-20 MED ORDER — LEVOFLOXACIN 500 MG PO TABS
500.0000 mg | ORAL_TABLET | Freq: Once | ORAL | Status: AC
  Administered 2015-04-20: 500 mg via ORAL
  Filled 2015-04-20: qty 1

## 2015-04-20 NOTE — Discharge Instructions (Signed)
Pulmonary Edema °Pulmonary edema (PE) is a condition in which fluid collects in the lungs. This makes it hard to breathe. PE may be a result of the heart not pumping very well or a result of injury.  °CAUSES  °· Coronary artery disease causes blockages in the arteries of the heart. This deprives the heart muscle of oxygen and weakens the muscle. A heart attack is a form of coronary artery disease. °· High blood pressure causes the heart muscle to work harder than usual. Over time, the heart muscle may get stiff, and it starts to work less efficiently. It may also fatigue and weaken. °· Viral infection of the heart (myocarditis) may weaken the heart muscle. °· Metabolic conditions such as thyroid disease, excessive alcohol use, certain vitamin deficiencies, or diabetes may also weaken the heart muscle. °· Leaky or stiff heart valves may impair normal heart function. °· Lung disease may strain the heart muscle. °· Excessive demands on the heart such as too much salt or fluid intake. °· Failure to take prescribed medicines. °· Lung injury from heat or toxins, such as poisonous gas. °· Infection in the lungs or other parts of the body. °· Fluid overload caused by kidney failure or medicines. °SYMPTOMS  °· Shortness of breath at rest or with exertion. °· Grunting, wheezing, or gurgling while breathing. °· Feeling like you cannot get enough air. °· Breaths are shallow and fast. °· A lot of coughing with frothy or bloody mucus. °· Skin may become cool, damp, and turn a pale or bluish color. °DIAGNOSIS  °Initial diagnosis may be based on your history, symptoms, and a physical examination. Additional tests for PE may include: °· Electrocardiography. °· Chest X-ray. °· Blood tests. °· Stress test. °· Ultrasound evaluation of the heart (echocardiography). °· Evaluation by a heart doctor (cardiologist). °· Test of the heart arteries to look for blockages (angiography). °· Check of blood oxygen. °TREATMENT  °Treatment of PE will  depend on the underlying cause and will focus first on relieving the symptoms.  °· Extra oxygen to make breathing easier and assist with removing mucus. This may include breathing treatments or a tube into the lungs and a breathing machine. °· Medicine to help the body get rid of extra water, usually through an IV tube. °· Medicine to help the heart pump better. °· If poor heart function is the cause, treatment may include: °¨ Procedures to open blocked arteries, repair damaged heart valves, or remove some of the damaged heart muscle. °¨ A pacemaker to help the heart pump with less effort. °HOME CARE INSTRUCTIONS  °· Your health care provider will help you determine what type of exercise program may be helpful. It is important to maintain strength and increase it if possible. Pace your activities to avoid shortness of breath or chest pain. Rest for at least 1 hour before and after meals. Cardiac rehabilitation programs are available in some locations. °· Eat a heart-healthy diet low in salt, saturated fat, and cholesterol. Ask for help with choices. °· Make a list of every medicine, vitamin, or herbal supplement you are taking. Keep the list with you at all times. Show it to your health care provider at every visit and before starting a new medicine. Keep the list up to date. °· Ask your health care provider or pharmacist to help you write a plan or schedule so that you know things about each medicine such as: °¨ Why you are taking it. °¨ The possible side effects. °¨   The best time of day to take it. °¨ Foods to take with it or avoid. °¨ When to stop taking it. °· Record your hospital or clinic weight. When you get home, compare it to your scale and record your weight. Then, weigh yourself first thing in the morning daily, and record the weights. You should weigh yourself every morning after you urinate and before you eat breakfast. Wear the same amount of clothing each time you weigh yourself. Provide your health  care provider with your weight record. Daily weights are important in the early recognition of excess fluid. Tell your health care provider right away if you have gained 03 lb/1.4 kg in 1 day, 05 lb/2.3 kg in a week, or as directed by your health care provider. Your medicines may need to be adjusted. °· Blood pressure monitoring should be done as often as directed. You can get a home blood pressure cuff at your drugstore. Record these values and bring them with you for your clinic visits. Notify your health care provider if you become dizzy or light-headed when standing up. °· If you are currently a smoker, it is time to quit. Nicotine makes your heart work harder and is one of the leading causes of cardiac deaths. Do not use nicotine gum or patches before talking to your doctor. °· Make a follow-up appointment with your health care provider as directed. °· Ask your health care provider for a copy of your latest heart tracing (ECG) and keep a copy with you at all times. °SEEK IMMEDIATE MEDICAL CARE IF:  °· You have severe chest pain, especially if the pain is crushing or pressure-like and spreads to the arms, back, neck, or jaw. THIS IS AN EMERGENCY. Do not wait to see if the pain will go away. Call for local emergency medical help. Do not drive yourself to the hospital. °· You have sweating, feel sick to your stomach (nauseous), or are experiencing shortness of breath. °· Your weight increases by 03 lb/1.4 kg in 1 day or 05 lb/2.3 kg in a week. °· You notice increasing shortness of breath that is unusual for you. This may happen during rest, sleep, or with activity. °· You develop chest pain (angina) or pain that is unusual for you. °· You notice more swelling in your hands, feet, ankles, or abdomen. °· You notice lasting (persistent) dizziness, blurred vision, headache, or unsteadiness. °· You begin to cough up bloody mucus (sputum). °· You are unable to sleep because it is hard to breathe. °· You begin to feel a  "jumping" or "fluttering" sensation (palpitations) in the chest that is unusual for you. °MAKE SURE YOU: °· Understand these instructions. °· Will watch your condition. °· Will get help right away if you are not doing well or get worse. °  °This information is not intended to replace advice given to you by your health care provider. Make sure you discuss any questions you have with your health care provider. °  °Document Released: 07/19/2002 Document Revised: 05/03/2013 Document Reviewed: 01/03/2013 °[employer]vier Interactive Patient Education ©2016 [employer] ° °

## 2015-04-20 NOTE — ED Provider Notes (Signed)
CSN: [id number]     Arrival date & time 04/20/15  1134 History   First MD Initiated Contact with Patient 04/20/15 1310     Chief Complaint  Patient presents with  . Pneumonia     (Consider location/radiation/quality/duration/timing/severity/associated sxs/prior Treatment) HPI Vision has 2 weeks of progressive dyspnea on exertion and cough productive of tan sputum. No fever or chills. She denies any new lower extremity swelling or pain. Denies any chest pain or tightness. Patient has a history of HIV with CD4 count greater than 1000. States he is compliant with medications. Seen by his primary physician yesterday. Had chest x-ray with questionable pneumonia and sent to ED for evaluation. Patient is on Coumadin for previous DVT. D-dimer was drawn yesterday which was normal. Past Medical History  Diagnosis Date  . Osteoarthritis, knee     s/p B TKA  . HIV infection ([place]) dx 1993  . TIA (transient ischemic attack) 1997    mild residual L mouth droop  . H/O hiatal hernia 2008    surgery  . Gynecomastia, male   . Impotence of organic origin   . Rheumatoid arthritis(714.0) dx 2010    MTX, follows with rheum  . Gout   . Chronic back pain     follows with Nsurg  . Coronary artery disease 2010    s/p CABG '10, sees Dr. Percival Spanish  . Seasonal allergies   . Diverticulosis   . Gallstones   . Status post dilation of esophageal narrowing   . GERD (gastroesophageal reflux disease)   . Diverticulosis   . Esophagitis   . Hemorrhoids   . Tubular adenoma of colon   . Myocardial infarction ([place]) 2010     x 2  . Hypertension   . DVT, lower extremity, recurrent ([place]) 2008, 2009    LLE, chronic anticoag since 2009  . Carotid artery occlusion     40-60% right ICA stenosis (09/2008)  . Neuromuscular disorder (HCC)     neuropathy  . Fibromyalgia   . Hepatitis A yrs ago  . CLL (chronic lymphoblastic leukemia) dx 2010    Followed at [facility] q63mo, no current therapy   . Secondary syphilis 07/24/14 dx     s/p 2 wks doxy  . Type II diabetes mellitus with manifestations ([place]) 04/02/2011   Past Surgical History  Procedure Laterality Date  . Spine surgery  2010    "rod and screws", "failed", lopwer spine,   . Cholecystectomy    . Hiatal hernia repair      wrap  . Shoulder surgery Left   . Mandible surgery Bilateral     tmj  . Varicose vein      stripping  . Replacement total knee Bilateral   . Knee arthroplasty  07/22/2011    Procedure: COMPUTER ASSISTED TOTAL KNEE ARTHROPLASTY;  Surgeon: Meredith Pel, MD;  Location: [facility];  Service: Orthopedics;  Laterality: Right;  Right total knee arthroplasty  . Coronary artery bypass graft  2010    triple bypass  . Hardware removal N/A 07/02/2012    Procedure: HARDWARE REMOVAL;  Surgeon: Elaina Hoops, MD;  Location: [facility];  Service: Neurosurgery;  Laterality: N/A;  . Inguinal hernia repair Bilateral   . Umbilical hernia repair      x 1  . Rotator cuff repair Right   . Esophagogastroduodenoscopy (egd) with propofol N/A 12/28/2012    Procedure: ESOPHAGOGASTRODUODENOSCOPY (EGD) WITH PROPOFOL;  Surgeon: Jerene Bears, MD;  Location: [facility];  Service: Gastroenterology;  Laterality: N/A;  . Colonoscopy with propofol N/A 12/28/2012    Procedure: COLONOSCOPY WITH PROPOFOL;  Surgeon: Jerene Bears, MD;  Location: [facility];  Service: Gastroenterology;  Laterality: N/A;  . Joint replacement Left 1999  . Tonsillectomy    . Ring around testicle hernia reapir  184 and 1986    x 2  . Esophagogastroduodenoscopy (egd) with propofol N/A 03/15/2013    Procedure: ESOPHAGOGASTRODUODENOSCOPY (EGD) WITH PROPOFOL;  Surgeon: Jerene Bears, MD;  Location: [facility];  Service: Gastroenterology;  Laterality: N/A;   Family History  Problem Relation Age of Onset  . Breast cancer Mother   . Hypertension Mother   . Hyperlipidemia Mother   . Diabetes Mother   . Prostate cancer Father   . Colon polyps Father   . Hyperlipidemia Father   . Crohn's  disease Paternal Aunt   . Diabetes Maternal Grandmother   . Diabetes Brother     x 3  . Heart disease Brother     x 3  . Hyperlipidemia Brother     x 3  . Colon cancer Neg Hx   . Alcohol abuse Daughter   . Drug abuse Daughter    Social History  Substance Use Topics  . Smoking status: Never Smoker   . Smokeless tobacco: Never Used     Comment: occ wine  . Alcohol Use: Yes     Comment: occasional wine- 1-2 per week    Review of Systems  Constitutional: Negative for fever and chills.  HENT: Negative for congestion, rhinorrhea and sore throat.   Respiratory: Positive for cough and shortness of breath. Negative for wheezing.   Cardiovascular: Negative for chest pain, palpitations and leg swelling.  Gastrointestinal: Negative for nausea, vomiting, abdominal pain, diarrhea and constipation.  Musculoskeletal: Negative for back pain, neck pain and neck stiffness.  Skin: Negative for rash and wound.  Neurological: Negative for dizziness, seizures, syncope, weakness, light-headedness, numbness and headaches.  All other systems reviewed and are negative.     Allergies  Morphine; Other; Peanut-containing drug products; Penicillins; Oxycodone-acetaminophen; and Promethazine hcl  Home Medications   Prior to Admission medications   Medication Sig Start Date End Date Taking? Authorizing Provider  zolpidem (AMBIEN) 5 MG tablet Take 1 tablet (5 mg total) by mouth at bedtime as needed for sleep. 11/10/13  Yes Biagio Borg, MD  aspirin EC 81 MG tablet Take 81 mg by mouth daily.    Historical Provider, MD  atorvastatin (LIPITOR) 10 MG tablet Take 1 tablet (10 mg total) by mouth at bedtime. 11/30/14   [name], MD  diphenoxylate-atropine (LOMOTIL) 2.5-0.025 MG per tablet Take 1 tablet by mouth 4 (four) times daily as needed for diarrhea or loose stools. 02/05/15   [name] S Esterwood, PA-C  EPINEPHrine (EPIPEN 2-PAK) 0.3 mg/0.3 mL IJ SOAJ injection Inject 0.3 mLs (0.3 mg total) into the muscle  once. 06/12/14   Rowe Clack, MD  escitalopram (LEXAPRO) 10 MG tablet Take 1 tablet (10 mg total) by mouth daily. 05/26/14 05/26/15  Rowe Clack, MD  furosemide (LASIX) 20 MG tablet Take 1 tablet (20 mg total) by mouth daily. 04/20/15   Julianne Rice, MD  GENVOYA 150-150-200-10 MG TABS tablet TAKE 1 TABLET BY MOUTH DAILY WITH BREAKFAST. 01/08/15   Thayer Headings, MD  inFLIXimab (REMICADE) 100 MG injection Inject into the vein every 6 (six) weeks.     Historical Provider, MD  levofloxacin (LEVAQUIN) 500 MG tablet Take 1 tablet (500 mg total) by  mouth daily. 04/20/15   Julianne Rice, MD  loratadine (CLARITIN) 10 MG tablet Take 1 tablet (10 mg total) by mouth daily. 03/13/15   [name], MD  LYRICA 200 MG capsule TAKE 1 CAPSULE BY MOUTH TWICE DAILY 01/24/15   Campbell Riches, MD  metoprolol succinate (TOPROL-XL) 25 MG 24 hr tablet Take 1 tablet (25 mg total) by mouth at bedtime. 01/05/15   [name], MD  NASONEX 50 MCG/ACT nasal spray USE 2 SPRAYS IN [facility] NOSTRIL ONCE A DAY 08/28/14   [name], MD  ondansetron (ZOFRAN ODT) 4 MG disintegrating tablet 4mg  ODT q6 hours prn nausea/vomit 07/11/14   Thayer Headings, MD  pantoprazole (PROTONIX) 40 MG tablet TAKE 1 TABLET EVERY DAY 06/19/14   [name], MD  pantoprazole (PROTONIX) 40 MG tablet TAKE 1 TABLET EVERY DAY 03/05/15   Jerene Bears, MD  PREZISTA 800 MG tablet TAKE 1 TABLET (800 MG TOTAL) BY MOUTH DAILY. 01/22/15   Thayer Headings, MD  saccharomyces boulardii (FLORASTOR) 250 MG capsule Take 1 capsule (250 mg total) by mouth 2 (two) times daily. 02/05/15   [name] S Esterwood, PA-C  traMADol (ULTRAM) 50 MG tablet Take 50-100 mg by mouth every 6 (six) hours as needed (for pain).    Historical Provider, MD  urea (CARMOL) 10 % cream APPLY TOPICALLY AS NEEDED. 04/09/15   Campbell Riches, MD  valACYclovir (VALTREX) 500 MG tablet TAKE 1 TABLET BY MOUTH EVERY DAY 04/09/15   Thayer Headings, MD  warfarin (COUMADIN) 10 MG tablet TAKE 1/2 ON  [facility] AND 1 TABLET ALL OTHER DAYS 11/16/14   [name], MD   BP 131/79 mmHg  Pulse 61  Temp(Src) 98.1 F (36.7 C) (Oral)  Resp 22  SpO2 99% Physical Exam  Constitutional: He is oriented to person, place, and time. He appears well-developed and well-nourished. No distress.  HENT:  Head: Normocephalic and atraumatic.  Mouth/Throat: Oropharynx is clear and moist. No oropharyngeal exudate.  Eyes: EOM are normal. Pupils are equal, round, and reactive to light.  Neck: Normal range of motion. Neck supple. JVD present.  Cardiovascular: Normal rate and regular rhythm.  Exam reveals no gallop and no friction rub.   No murmur heard. Pulmonary/Chest: Effort normal and breath sounds normal. No respiratory distress. He has no wheezes. He has no rales. He exhibits no tenderness.  Abdominal: Soft. Bowel sounds are normal. He exhibits no distension and no mass. There is no tenderness. There is no rebound and no guarding.  Musculoskeletal: Normal range of motion. He exhibits edema. He exhibits no tenderness.  1+ bilateral lower extremity pitting edema. or tenderness. distal pulses intact.  Neurological: He is alert and oriented to person, place, and time.  Skin: Skin is warm and dry. No rash noted. No erythema.  Psychiatric: He has a normal mood and affect. His behavior is normal.  Nursing note and vitals reviewed.   ED Course  Procedures (including critical care time) Labs Review Labs Reviewed  COMPREHENSIVE METABOLIC PANEL - Abnormal; Notable for the following:    Calcium 8.6 (*)    Total Protein 5.8 (*)    All other components within normal limits  CBC - Abnormal; Notable for the following:    WBC 12.7 (*)    RDW 16.1 (*)    Platelets 138 (*)    All other components within normal limits  BRAIN NATRIURETIC PEPTIDE - Abnormal; Notable for the following:    B Natriuretic Peptide 164.7 (*)  All other components within normal limits  PROTIME-INR - Abnormal; Notable for the following:     Prothrombin Time 30.4 (*)    INR 2.98 (*)    All other components within normal limits  I-STAT TROPOININ, ED    Imaging Review Dg Chest 2 View  04/20/2015  CLINICAL DATA:  Cough, chest pain for 2 weeks. EXAM: CHEST  2 VIEW COMPARISON:  April 19, 2015. FINDINGS: Stable cardiomediastinal silhouette. No pneumothorax or pleural effusion is noted. Status post coronary artery bypass graft. Stable interstitial densities are noted in the perihilar and basilar regions, concerning for chronic interstitial scarring or edema. Significant degenerative changes seen involving the lower thoracic spine. IMPRESSION: Stable bilateral perihilar and basilar interstitial densities are noted concerning for edema or chronic scarring. Electronically Signed   By: Marijo Conception, M.D.   On: 04/20/2015 13:47   Dg Chest 2 View  04/20/2015  CLINICAL DATA:  Cough EXAM: CHEST  2 VIEW COMPARISON:  02/09/2014 FINDINGS: Diffuse interstitial coarsening with Kerley lines. No cephalized blood flow or vascular pedicle widening. Chronic borderline cardiomegaly. Stable aortic contours. No focal consolidation, effusion, or pneumothorax. The patient is status post median sternotomy for CABG. Hiatal hernia with fluid level noted. Calcified granuloma on the right. Advanced disc degeneration above a lumbar spine fixation, partly visualized. IMPRESSION: Interstitial process which could reflect CHF or bronchitis/atypical infection. Electronically Signed   By: Monte Fantasia M.D.   On: 04/20/2015 08:32   I have personally reviewed and evaluated these images and lab results as part of my medical decision-making.   EKG Interpretation   Date/Time:  Friday April 20 2015 13:42:28 EST Ventricular Rate:  62 PR Interval:  166 QRS Duration: 86 QT Interval:  539 QTC Calculation: 547 R Axis:   7 Text Interpretation:  Sinus rhythm Low voltage, precordial leads  Anteroseptal infarct, age indeterminate Prolonged QT interval Baseline  wander  in lead(s) I V2 Confirmed by Lita Mains  MD, Derrich Gaby ([id number]) on  04/20/2015 3:51:17 PM      MDM   Final diagnoses:  Acute pulmonary edema (HCC)   Patients in no distress. Ambulating without any hypoxia. X-ray likely due to edema. Elevation in BNP. Patient has no infective symptoms. Chronically elevated white blood cell count. Patient with low viral load and normal CD4 count. We'll treat with low dose Lasix and advised follow-up with primary physician. Given possibility of pneumonia will start on antibiotic though I have low suspicion for this.     Julianne Rice, MD 04/20/15 [phone]

## 2015-04-20 NOTE — ED Notes (Signed)
Pt ambulated in the hallway with pulse ox. Heart rate 71, o2 96

## 2015-04-20 NOTE — ED Notes (Signed)
Pt reports going to pcp yesterday due to cough and chest congestion. Pt had chest xray done and was diagnosed with bilateral pneumonia and sent here for treatment. Denies fever. No resp distress noted at triage.

## 2015-04-20 NOTE — ED Notes (Signed)
Patient d/c'd from monitor, continuous pulse oximetry and blood pressure cuff; patient getting dressed to be discharged home 

## 2015-04-21 NOTE — Progress Notes (Signed)
Subjective:  Patient ID: [name], male    DOB: 04-15-1951  Age: 64 y.o. MRN: [id number]  CC: Cough   HPI [name] presents for a several week history of severe fatigue, skin cough, shortness of breath, dyspnea on exertion and lower extremity edema.  Outpatient Prescriptions Prior to Visit  Medication Sig Dispense Refill  . aspirin EC 81 MG tablet Take 81 mg by mouth daily.    [name] atorvastatin (LIPITOR) 10 MG tablet Take 1 tablet (10 mg total) by mouth at bedtime. 90 tablet 3  . diphenoxylate-atropine (LOMOTIL) 2.5-0.025 MG per tablet Take 1 tablet by mouth 4 (four) times daily as needed for diarrhea or loose stools. 30 tablet 1  . EPINEPHrine (EPIPEN 2-PAK) 0.3 mg/0.3 mL IJ SOAJ injection Inject 0.3 mLs (0.3 mg total) into the muscle once. 2 Device 0  . escitalopram (LEXAPRO) 10 MG tablet Take 1 tablet (10 mg total) by mouth daily. 90 tablet 1  . GENVOYA 150-150-200-10 MG TABS tablet TAKE 1 TABLET BY MOUTH DAILY WITH BREAKFAST. 30 tablet 5  . inFLIXimab (REMICADE) 100 MG injection Inject into the vein every 6 (six) weeks.     [name] loratadine (CLARITIN) 10 MG tablet Take 1 tablet (10 mg total) by mouth daily. 90 tablet 3  . LYRICA 200 MG capsule TAKE 1 CAPSULE BY MOUTH TWICE DAILY 60 capsule 5  . metoprolol succinate (TOPROL-XL) 25 MG 24 hr tablet Take 1 tablet (25 mg total) by mouth at bedtime. 30 tablet 11  . NASONEX 50 MCG/ACT nasal spray USE 2 SPRAYS IN EACH NOSTRIL ONCE A DAY 17 g 11  . ondansetron (ZOFRAN ODT) 4 MG disintegrating tablet 4mg  ODT q6 hours prn nausea/vomit 20 tablet 0  . pantoprazole (PROTONIX) 40 MG tablet TAKE 1 TABLET EVERY DAY 60 tablet 0  . pantoprazole (PROTONIX) 40 MG tablet TAKE 1 TABLET EVERY DAY 30 tablet 2  . PREZISTA 800 MG tablet TAKE 1 TABLET (800 MG TOTAL) BY MOUTH DAILY. 30 tablet 5  . saccharomyces boulardii (FLORASTOR) 250 MG capsule Take 1 capsule (250 mg total) by mouth 2 (two) times daily. 60 capsule 1  . traMADol (ULTRAM) 50 MG tablet  Take 50-100 mg by mouth every 6 (six) hours as needed (for pain).    . urea (CARMOL) 10 % cream APPLY TOPICALLY AS NEEDED. 85 g 0  . valACYclovir (VALTREX) 500 MG tablet TAKE 1 TABLET BY MOUTH EVERY DAY 90 tablet 3  . warfarin (COUMADIN) 10 MG tablet TAKE 1/2 ON WEDNESDAY AND 1 TABLET ALL OTHER DAYS 60 tablet 3  . zolpidem (AMBIEN) 5 MG tablet Take 1 tablet (5 mg total) by mouth at bedtime as needed for sleep. 30 tablet 0   No facility-administered medications prior to visit.    ROS Review of Systems  Constitutional: Positive for diaphoresis and fatigue. Negative for fever, chills, activity change, appetite change and unexpected weight change.  HENT: Negative.  Negative for congestion, facial swelling, sinus pressure, sore throat and trouble swallowing.   Eyes: Negative.   Respiratory: Positive for cough and shortness of breath. Negative for apnea, choking, chest tightness, wheezing and stridor.   Cardiovascular: Positive for leg swelling. Negative for chest pain and palpitations.  Gastrointestinal: Negative.  Negative for nausea, vomiting, abdominal pain, diarrhea, constipation and blood in stool.  Endocrine: Negative.   Genitourinary: Negative.   Musculoskeletal: Negative.  Negative for myalgias, back pain, joint swelling and arthralgias.  Skin: Negative.  Negative for color change and rash.  Allergic/Immunologic: Negative.   Neurological: Negative.   Hematological: Negative.  Negative for adenopathy. Does not bruise/bleed easily.  Psychiatric/Behavioral: Negative.     Objective:  BP 132/78 mmHg  Pulse 67  Temp(Src) 98.3 F (36.8 C) (Oral)  Resp 20  Ht 5\' 7"  (1.702 m)  Wt 179 lb 3 oz (81.279 kg)  BMI 28.06 kg/m2  SpO2 92%  BP Readings from Last 3 Encounters:  04/20/15 137/77  04/19/15 132/78  02/05/15 110/60    Wt Readings from Last 3 Encounters:  04/19/15 179 lb 3 oz (81.279 kg)  02/05/15 183 lb 3.2 oz (83.099 kg)  01/19/15 181 lb (82.101 kg)    Physical Exam    Constitutional: He is oriented to person, place, and time. No distress.  HENT:  Mouth/Throat: Oropharynx is clear and moist. No oropharyngeal exudate.  Eyes: Conjunctivae are normal. Right eye exhibits no discharge. Left eye exhibits no discharge. No scleral icterus.  Neck: Normal range of motion. Neck supple. No JVD present. No tracheal deviation present. No thyromegaly present.  Cardiovascular: Normal rate, normal heart sounds and intact distal pulses.  Exam reveals no gallop and no friction rub.   No murmur heard. EKG- Normal sinus rhythm, no rhythm catch her in V2. Decreased voltage in the precordial leads. EKG is unchanged from prior EKGs.  Pulmonary/Chest: Effort normal and breath sounds normal. No stridor. No respiratory distress. He has no wheezes. He has no rales. He exhibits no tenderness.  Abdominal: Soft. Bowel sounds are normal. He exhibits no distension and no mass. There is no tenderness. There is no rebound and no guarding.  Musculoskeletal: Normal range of motion. He exhibits edema. He exhibits no tenderness.  Lymphadenopathy:    He has no cervical adenopathy.  Neurological: He is oriented to person, place, and time.  Skin: Skin is warm and dry. No rash noted. He is not diaphoretic. No erythema. No pallor.  Psychiatric: He has a normal mood and affect. His behavior is normal. Judgment and thought content normal.    Lab Results  Component Value Date   WBC 12.7* 04/20/2015   HGB 13.6 04/20/2015   HCT 42.9 04/20/2015   PLT 138* 04/20/2015   GLUCOSE 87 04/20/2015   CHOL 132 11/20/2014   TRIG 101 11/20/2014   HDL 30* 11/20/2014   LDLCALC 82 11/20/2014   ALT 24 04/20/2015   AST 31 04/20/2015   NA 140 04/20/2015   K 4.0 04/20/2015   CL 107 04/20/2015   CREATININE 0.90 04/20/2015   BUN 17 04/20/2015   CO2 27 04/20/2015   TSH 0.60 07/11/2014   INR 2.98* 04/20/2015   HGBA1C 4.7 09/05/2014    Dg Chest 2 View  04/20/2015  CLINICAL DATA:  Cough, chest pain for 2  weeks. EXAM: CHEST  2 VIEW COMPARISON:  April 19, 2015. FINDINGS: Stable cardiomediastinal silhouette. No pneumothorax or pleural effusion is noted. Status post coronary artery bypass graft. Stable interstitial densities are noted in the perihilar and basilar regions, concerning for chronic interstitial scarring or edema. Significant degenerative changes seen involving the lower thoracic spine. IMPRESSION: Stable bilateral perihilar and basilar interstitial densities are noted concerning for edema or chronic scarring. Electronically Signed   By: Marijo Conception, M.D.   On: 04/20/2015 13:47    Assessment & Plan:   [name] was seen today for cough.  Diagnoses and all orders for this visit:  DOE (dyspnea on exertion)- his EKG is unchanged, his chest x-ray shows a diffuse interstitial pattern, his brain natruretic  peptide slightly elevated. Will refer to the ER for further evaluation of fluid retention, possible development of congestive heart failure. -     EKG 12-Lead -     Troponin I; Future -     Cardiac panel; Future -     D-dimer, quantitative (not at [facility]); Future -     Brain natriuretic peptide; Future -     CBC with Differential/Platelet; Future  Essential hypertension- his blood pressure is well-controlled, lites and renal function are stable. -     Basic metabolic panel; Future  DVT, lower extremity, recurrent, bilateral ([place])- his d-dimer is low and he remains on anticoagulation, I do not think he has recurrent DVT. -     D-dimer, quantitative (not at [facility]); Future  Cough- his chest x-ray shows diffuse bilateral interstitial pattern. -     DG Chest 2 View; Future  Coronary artery disease involving coronary bypass graft of native heart with unstable angina pectoris ([place])- his cardiac enzymes are normal but his brain natruretic peptide is elevated. He will be seen in the emergency room for further evaluation. -     Troponin I; Future -     Cardiac panel; Future -     Brain  natriuretic peptide; Future  Bilateral pneumonia- this is a complicated male with a history of HIV infection who now has a diffuse, bilateral interstitial pattern on chest x-ray. In the office visit with me his pulse ox was down to 92%. I therefore asked him to be seen in emergency room for consideration of treatment of possible atypical pneumonia, opportunistic infection, or other causes of an abnormal chest rate appearance like this.   I am having Mr. Lemming maintain his aspirin EC, inFLIXimab, zolpidem, traMADol, escitalopram, EPINEPHrine, pantoprazole, ondansetron, NASONEX, warfarin, atorvastatin, metoprolol succinate, GENVOYA, PREZISTA, LYRICA, diphenoxylate-atropine, saccharomyces boulardii, pantoprazole, loratadine, valACYclovir, and urea.  No orders of the defined types were placed in this encounter.     Follow-up: Return in about 3 weeks (around 05/10/2015).  [name], MD

## 2015-04-24 DIAGNOSIS — B351 Tinea unguium: Secondary | ICD-10-CM | POA: Diagnosis not present

## 2015-04-24 DIAGNOSIS — E1142 Type 2 diabetes mellitus with diabetic polyneuropathy: Secondary | ICD-10-CM | POA: Diagnosis not present

## 2015-04-24 DIAGNOSIS — L84 Corns and callosities: Secondary | ICD-10-CM | POA: Diagnosis not present

## 2015-04-26 ENCOUNTER — Encounter: Payer: Self-pay | Admitting: Internal Medicine

## 2015-04-26 ENCOUNTER — Ambulatory Visit (INDEPENDENT_AMBULATORY_CARE_PROVIDER_SITE_OTHER)
Admission: RE | Admit: 2015-04-26 | Discharge: 2015-04-26 | Disposition: A | Discharge status: Home / Self Care | Payer: Medicare Other | Source: Ambulatory Visit | Attending: Internal Medicine | Admitting: Internal Medicine

## 2015-04-26 ENCOUNTER — Other Ambulatory Visit (INDEPENDENT_AMBULATORY_CARE_PROVIDER_SITE_OTHER): Payer: Medicare Other

## 2015-04-26 ENCOUNTER — Ambulatory Visit (INDEPENDENT_AMBULATORY_CARE_PROVIDER_SITE_OTHER): Payer: Medicare Other | Admitting: Internal Medicine

## 2015-04-26 VITALS — BP 138/86 | HR 66 | Temp 98.9°F | Resp 16 | Ht 68.0 in | Wt 196.8 lb

## 2015-04-26 DIAGNOSIS — I2510 Atherosclerotic heart disease of native coronary artery without angina pectoris: Secondary | ICD-10-CM

## 2015-04-26 DIAGNOSIS — R059 Cough, unspecified: Secondary | ICD-10-CM

## 2015-04-26 DIAGNOSIS — R0609 Other forms of dyspnea: Secondary | ICD-10-CM

## 2015-04-26 DIAGNOSIS — R05 Cough: Secondary | ICD-10-CM | POA: Diagnosis not present

## 2015-04-26 DIAGNOSIS — R0600 Dyspnea, unspecified: Secondary | ICD-10-CM

## 2015-04-26 DIAGNOSIS — J849 Interstitial pulmonary disease, unspecified: Secondary | ICD-10-CM

## 2015-04-26 DIAGNOSIS — I25708 Atherosclerosis of coronary artery bypass graft(s), unspecified, with other forms of angina pectoris: Secondary | ICD-10-CM | POA: Diagnosis not present

## 2015-04-26 DIAGNOSIS — J811 Chronic pulmonary edema: Secondary | ICD-10-CM | POA: Diagnosis not present

## 2015-04-26 DIAGNOSIS — R0602 Shortness of breath: Secondary | ICD-10-CM | POA: Diagnosis not present

## 2015-04-26 LAB — CBC WITH DIFFERENTIAL/PLATELET
Basophils Absolute: 0.1 10*3/uL (ref 0.0–0.1)
Basophils Relative: 0.7 % (ref 0.0–3.0)
Eosinophils Absolute: 0.1 10*3/uL (ref 0.0–0.7)
Eosinophils Relative: 1 % (ref 0.0–5.0)
HCT: 41.9 % (ref 39.0–52.0)
Hemoglobin: 13.4 g/dL (ref 13.0–17.0)
Lymphocytes Relative: 74.2 % — ABNORMAL HIGH (ref 12.0–46.0)
Lymphs Abs: 9 10*3/uL — ABNORMAL HIGH (ref 0.7–4.0)
MCHC: 32 g/dL (ref 30.0–36.0)
MCV: 86.4 fl (ref 78.0–100.0)
Monocytes Absolute: 1.2 10*3/uL — ABNORMAL HIGH (ref 0.1–1.0)
Monocytes Relative: 9.7 % (ref 3.0–12.0)
Neutro Abs: 1.7 10*3/uL (ref 1.4–7.7)
Neutrophils Relative %: 14.4 % — ABNORMAL LOW (ref 43.0–77.0)
Platelets: 147 10*3/uL — ABNORMAL LOW (ref 150.0–400.0)
RBC: 4.85 Mil/uL (ref 4.22–5.81)
RDW: 16.6 % — ABNORMAL HIGH (ref 11.5–15.5)
WBC: 12.1 10*3/uL — ABNORMAL HIGH (ref 4.0–10.5)

## 2015-04-26 LAB — BASIC METABOLIC PANEL WITH GFR
BUN: 21 mg/dL (ref 6–23)
CO2: 32 meq/L (ref 19–32)
Calcium: 8.5 mg/dL (ref 8.4–10.5)
Chloride: 106 meq/L (ref 96–112)
Creatinine, Ser: 1.1 mg/dL (ref 0.40–1.50)
GFR: 70.98 mL/min
Glucose, Bld: 92 mg/dL (ref 70–99)
Potassium: 3.9 meq/L (ref 3.5–5.1)
Sodium: 141 meq/L (ref 135–145)

## 2015-04-26 LAB — BRAIN NATRIURETIC PEPTIDE: Pro B Natriuretic peptide (BNP): 182 pg/mL — ABNORMAL HIGH (ref 0.0–100.0)

## 2015-04-26 LAB — TROPONIN I: TNIDX: 0 ug/L (ref 0.00–0.06)

## 2015-04-26 MED ORDER — FUROSEMIDE 40 MG PO TABS: 40.0000 mg | ORAL_TABLET | Freq: Two times a day (BID) | ORAL | Status: DC

## 2015-04-26 NOTE — Progress Notes (Signed)
Pre visit review using our clinic review tool, if applicable. No additional management support is needed unless otherwise documented below in the visit note. 

## 2015-04-26 NOTE — Patient Instructions (Signed)

## 2015-04-26 NOTE — Progress Notes (Signed)
Subjective:  Patient ID: [name], male    DOB: 03-04-1951  Age: 64 y.o. MRN: [id number]  CC: Cough   HPI [name] presents for follow-up. He was seen about a week ago at which time he was complaining of cough and shortness of breath. He feels better in some respects but he complains of worsening edema in his lower legs and ankles. When he was seen in the emergency room he was told that the abnormal chest x-ray was a sign of pulmonary edema so he was given a diuretic, Lasix 20 mg once a day. He says initially that helped but now the edema is coming back. He also complains of persistent cough and shortness of breath. Strangely he says the cough is occasionally productive of black phlegm.  Outpatient Prescriptions Prior to Visit  Medication Sig Dispense Refill  . aspirin EC 81 MG tablet Take 81 mg by mouth daily.    [name] atorvastatin (LIPITOR) 10 MG tablet Take 1 tablet (10 mg total) by mouth at bedtime. 90 tablet 3  . diphenoxylate-atropine (LOMOTIL) 2.5-0.025 MG per tablet Take 1 tablet by mouth 4 (four) times daily as needed for diarrhea or loose stools. 30 tablet 1  . EPINEPHrine (EPIPEN 2-PAK) 0.3 mg/0.3 mL IJ SOAJ injection Inject 0.3 mLs (0.3 mg total) into the muscle once. 2 Device 0  . escitalopram (LEXAPRO) 10 MG tablet Take 1 tablet (10 mg total) by mouth daily. 90 tablet 1  . GENVOYA 150-150-200-10 MG TABS tablet TAKE 1 TABLET BY MOUTH DAILY WITH BREAKFAST. 30 tablet 5  . inFLIXimab (REMICADE) 100 MG injection Inject into the vein every 6 (six) weeks.     [name] loratadine (CLARITIN) 10 MG tablet Take 1 tablet (10 mg total) by mouth daily. 90 tablet 3  . LYRICA 200 MG capsule TAKE 1 CAPSULE BY MOUTH TWICE DAILY 60 capsule 5  . metoprolol succinate (TOPROL-XL) 25 MG 24 hr tablet Take 1 tablet (25 mg total) by mouth at bedtime. 30 tablet 11  . NASONEX 50 MCG/ACT nasal spray USE 2 SPRAYS IN EACH NOSTRIL ONCE A DAY 17 g 11  . ondansetron (ZOFRAN ODT) 4 MG disintegrating tablet 4mg   ODT q6 hours prn nausea/vomit 20 tablet 0  . pantoprazole (PROTONIX) 40 MG tablet TAKE 1 TABLET EVERY DAY 60 tablet 0  . pantoprazole (PROTONIX) 40 MG tablet TAKE 1 TABLET EVERY DAY 30 tablet 2  . PREZISTA 800 MG tablet TAKE 1 TABLET (800 MG TOTAL) BY MOUTH DAILY. 30 tablet 5  . saccharomyces boulardii (FLORASTOR) 250 MG capsule Take 1 capsule (250 mg total) by mouth 2 (two) times daily. 60 capsule 1  . traMADol (ULTRAM) 50 MG tablet Take 50-100 mg by mouth every 6 (six) hours as needed (for pain).    . urea (CARMOL) 10 % cream APPLY TOPICALLY AS NEEDED. 85 g 0  . valACYclovir (VALTREX) 500 MG tablet TAKE 1 TABLET BY MOUTH EVERY DAY 90 tablet 3  . warfarin (COUMADIN) 10 MG tablet TAKE 1/2 ON WEDNESDAY AND 1 TABLET ALL OTHER DAYS 60 tablet 3  . zolpidem (AMBIEN) 5 MG tablet Take 1 tablet (5 mg total) by mouth at bedtime as needed for sleep. 30 tablet 0  . furosemide (LASIX) 20 MG tablet Take 1 tablet (20 mg total) by mouth daily. 30 tablet 0  . levofloxacin (LEVAQUIN) 500 MG tablet Take 1 tablet (500 mg total) by mouth daily. 7 tablet 0   No facility-administered medications prior to visit.  ROS Review of Systems  Constitutional: Negative.  Negative for fever, chills, diaphoresis, appetite change and fatigue.  HENT: Negative.   Eyes: Negative.   Respiratory: Positive for cough and shortness of breath. Negative for apnea, choking, chest tightness, wheezing and stridor.   Cardiovascular: Positive for leg swelling. Negative for chest pain and palpitations.  Gastrointestinal: Negative.  Negative for nausea, vomiting, abdominal pain, diarrhea, constipation and blood in stool.  Endocrine: Negative.   Genitourinary: Negative.   Musculoskeletal: Positive for back pain. Negative for myalgias, arthralgias, gait problem, neck pain and neck stiffness.  Skin: Negative.  Negative for color change and rash.  Allergic/Immunologic: Negative.   Neurological: Negative.  Negative for dizziness, weakness,  light-headedness and numbness.  Hematological: Negative.  Negative for adenopathy. Does not bruise/bleed easily.  Psychiatric/Behavioral: Negative.     Objective:  BP 138/86 mmHg  Pulse 66  Temp(Src) 98.9 F (37.2 C) (Oral)  Resp 20  Ht 5\' 8"  (1.727 m)  Wt 196 lb 12 oz (89.245 kg)  BMI 29.92 kg/m2  SpO2 93%  BP Readings from Last 3 Encounters:  04/26/15 138/86  04/20/15 137/77  04/19/15 132/78    Wt Readings from Last 3 Encounters:  04/26/15 196 lb 12 oz (89.245 kg)  04/19/15 179 lb 3 oz (81.279 kg)  02/05/15 183 lb 3.2 oz (83.099 kg)    Physical Exam  Constitutional: He is oriented to person, place, and time. No distress.  HENT:  Head: Normocephalic and atraumatic.  Mouth/Throat: Oropharynx is clear and moist. No oropharyngeal exudate.  Eyes: Conjunctivae are normal. Right eye exhibits no discharge. Left eye exhibits no discharge. No scleral icterus.  Neck: Normal range of motion. Neck supple. No JVD present. No tracheal deviation present. No thyromegaly present.  Cardiovascular: Normal rate, regular rhythm, normal heart sounds and intact distal pulses.  Exam reveals no gallop and no friction rub.   No murmur heard. EKG - NSR, no Q waves and no ST/T wave changes  Pulmonary/Chest: Effort normal and breath sounds normal. No stridor. No respiratory distress. He has no wheezes. He has no rales. He exhibits no tenderness.  Abdominal: Soft. Bowel sounds are normal. He exhibits no distension and no mass. There is no tenderness. There is no rebound and no guarding.  Musculoskeletal: Normal range of motion. He exhibits edema (2+ pitting edema over both lower extremities extending to the mid shin area). He exhibits no tenderness.  Lymphadenopathy:    He has no cervical adenopathy.  Neurological: He is oriented to person, place, and time.  Skin: Skin is warm and dry. No rash noted. He is not diaphoretic. No erythema. No pallor.  Psychiatric: He has a normal mood and affect. His  behavior is normal. Judgment and thought content normal.  Vitals reviewed.   Lab Results  Component Value Date   WBC 12.1* 04/26/2015   HGB 13.4 04/26/2015   HCT 41.9 04/26/2015   PLT 147.0* 04/26/2015   GLUCOSE 92 04/26/2015   CHOL 132 11/20/2014   TRIG 101 11/20/2014   HDL 30* 11/20/2014   LDLCALC 82 11/20/2014   ALT 24 04/20/2015   AST 31 04/20/2015   NA 141 04/26/2015   K 3.9 04/26/2015   CL 106 04/26/2015   CREATININE 1.10 04/26/2015   BUN 21 04/26/2015   CO2 32 04/26/2015   TSH 0.60 07/11/2014   INR 2.0 04/27/2015   HGBA1C 4.7 09/05/2014    Dg Chest 2 View  04/20/2015  CLINICAL DATA:  Cough, chest pain for 2 weeks.  EXAM: CHEST  2 VIEW COMPARISON:  April 19, 2015. FINDINGS: Stable cardiomediastinal silhouette. No pneumothorax or pleural effusion is noted. Status post coronary artery bypass graft. Stable interstitial densities are noted in the perihilar and basilar regions, concerning for chronic interstitial scarring or edema. Significant degenerative changes seen involving the lower thoracic spine. IMPRESSION: Stable bilateral perihilar and basilar interstitial densities are noted concerning for edema or chronic scarring. Electronically Signed   By: Marijo Conception, M.D.   On: 04/20/2015 13:47    Assessment & Plan:   Sostenes was seen today for cough.  Diagnoses and all orders for this visit:  Coronary artery disease involving coronary bypass graft of native heart with other forms of angina pectoris ([place])- I'm concerned that some of his symptoms may be related to ischemia though his cardiac enzymes continue to be negative and his EKG is unremarkable. I've asked him to have a routine follow-up with his cardiologist. -     Troponin I; Future -     Basic metabolic panel; Future -     CBC with Differential/Platelet; Future -     Ambulatory referral to Cardiology  Chronic pulmonary edema- this now appears to be interstitial lung disease not edema, will refer to  pulmonary. -     Troponin I; Future -     Basic metabolic panel; Future -     CBC with Differential/Platelet; Future -     Ambulatory referral to Cardiology -     furosemide (LASIX) 40 MG tablet; Take 1 tablet (40 mg total) by mouth 2 (two) times daily.  Cough- the radiologist is now stating that the chest x-ray appearance is consistent with interstitial lung disease, -     DG Chest 2 View; Future -     Ambulatory referral to Pulmonology  DOE (dyspnea on exertion)- his BNP has not come down much and he has persistent edema, I will increase the dose of his Lasix to 40 mg twice a day -     Brain natriuretic peptide; Future -     EKG 12-Lead -     Ambulatory referral to Cardiology -     Ambulatory referral to Pulmonology  Interstitial lung disease [facility])- will refer to pulmonary for further evaluation -     Ambulatory referral to Pulmonology   I have discontinued Mr. Duell's levofloxacin and furosemide. I am also having him start on furosemide. Additionally, I am having him maintain his aspirin EC, inFLIXimab, zolpidem, traMADol, escitalopram, EPINEPHrine, pantoprazole, ondansetron, NASONEX, warfarin, atorvastatin, metoprolol succinate, GENVOYA, PREZISTA, LYRICA, diphenoxylate-atropine, saccharomyces boulardii, pantoprazole, loratadine, valACYclovir, and urea.  Meds ordered this encounter  Medications  . furosemide (LASIX) 40 MG tablet    Sig: Take 1 tablet (40 mg total) by mouth 2 (two) times daily.    Dispense:  60 tablet    Refill:  3     Follow-up: Return in about 3 weeks (around 05/17/2015).  [name], MD

## 2015-04-27 ENCOUNTER — Encounter: Payer: Self-pay | Admitting: Internal Medicine

## 2015-04-27 ENCOUNTER — Telehealth: Payer: Self-pay | Admitting: General Practice

## 2015-04-27 ENCOUNTER — Ambulatory Visit (INDEPENDENT_AMBULATORY_CARE_PROVIDER_SITE_OTHER): Payer: Medicare Other | Admitting: General Practice

## 2015-04-27 DIAGNOSIS — J849 Interstitial pulmonary disease, unspecified: Secondary | ICD-10-CM | POA: Insufficient documentation

## 2015-04-27 LAB — POCT INR: INR: 2

## 2015-04-27 NOTE — Telephone Encounter (Signed)
LMOVM

## 2015-04-27 NOTE — Progress Notes (Signed)
I have reviewed and agree with the plan. 

## 2015-04-27 NOTE — Progress Notes (Signed)
Pre visit review using our clinic review tool, if applicable. No additional management support is needed unless otherwise documented below in the visit note. 

## 2015-05-11 ENCOUNTER — Ambulatory Visit (INDEPENDENT_AMBULATORY_CARE_PROVIDER_SITE_OTHER)
Admission: RE | Admit: 2015-05-11 | Discharge: 2015-05-11 | Disposition: A | Discharge status: Home / Self Care | Payer: Medicare Other | Source: Ambulatory Visit | Attending: Internal Medicine | Admitting: Internal Medicine

## 2015-05-11 ENCOUNTER — Ambulatory Visit (INDEPENDENT_AMBULATORY_CARE_PROVIDER_SITE_OTHER): Payer: Medicare Other | Admitting: Internal Medicine

## 2015-05-11 ENCOUNTER — Encounter: Payer: Self-pay | Admitting: Internal Medicine

## 2015-05-11 VITALS — BP 100/62 | HR 69 | Ht 67.0 in | Wt 192.0 lb

## 2015-05-11 DIAGNOSIS — I2510 Atherosclerotic heart disease of native coronary artery without angina pectoris: Secondary | ICD-10-CM

## 2015-05-11 DIAGNOSIS — J8410 Pulmonary fibrosis, unspecified: Secondary | ICD-10-CM | POA: Insufficient documentation

## 2015-05-11 DIAGNOSIS — R599 Enlarged lymph nodes, unspecified: Secondary | ICD-10-CM | POA: Diagnosis not present

## 2015-05-11 DIAGNOSIS — R590 Localized enlarged lymph nodes: Secondary | ICD-10-CM

## 2015-05-11 DIAGNOSIS — R0602 Shortness of breath: Secondary | ICD-10-CM | POA: Diagnosis not present

## 2015-05-11 NOTE — Progress Notes (Signed)
Subjective:    Patient ID: [name], male    DOB: Dec 24, 1950,   MRN: [id number]  HPI  73 yowm never smoker with longstanding deforming RA followed by Dr [name] referrred 05/11/2015 by Dr Ronnald Ramp for ILD    05/11/2015 1st [facility] office visit/ Nicasio Barlowe   Chief Complaint  Patient presents with  . Pulmonary Consult    Referred by Dr. [name]. Pt c/o SOB and cough for the past 3 months, worse x 1 month. He states that he is SOB with any amount of exertion. His cough is prod with clear to "really dark" sputum. He also c/o chills and sweats.   indolent onset progressive doe /coughing since late summer of 2016 but better since lasix but still sob walking aisles at HT/ hcp/ arthritis is worse since breathing worse When started lasix was obviously having peirpheral edema but this has mostly resolved.  No obvious  patterns in day to day or daytime variabilty or assoc or cp or chest tightness, subjective wheeze overt sinus or hb symptoms. No unusual exp hx or h/o childhood pna/ asthma or knowledge of premature birth.  Sleeping ok without nocturnal  or early am exacerbation  of respiratory  c/o's or need for noct saba. Also denies any obvious fluctuation of symptoms with weather or environmental changes or other aggravating or alleviating factors except as outlined above   Current Medications, Allergies, Complete Past Medical History, Past Surgical History, Family History, and Social History were reviewed in [employer] record.               Review of Systems  Constitutional: Negative for fever, chills, activity change, appetite change and unexpected weight change.  HENT: Positive for trouble swallowing. Negative for congestion, dental problem, postnasal drip, rhinorrhea, sneezing, sore throat and voice change.   Eyes: Negative for visual disturbance.  Respiratory: Positive for cough and shortness of breath. Negative for choking.   Cardiovascular:  Positive for leg swelling. Negative for chest pain.  Gastrointestinal: Negative for nausea, vomiting and abdominal pain.  Genitourinary: Negative for difficulty urinating.  Musculoskeletal: Positive for arthralgias.  Skin: Negative for rash.  Psychiatric/Behavioral: Negative for behavioral problems and confusion.       Objective:   Physical Exam  amb wm nad   Wt Readings from Last 3 Encounters:  05/11/15 192 lb (87.091 kg)  04/26/15 196 lb 12 oz (89.245 kg)  04/19/15 179 lb 3 oz (81.279 kg)    Vital signs reviewed   HEENT: nl dentition, turbinates, and oropharynx. Nl external ear canals without cough reflex   NECK :  without JVD/Nodes/TM/ nl carotid upstrokes bilaterally   LUNGS: no acc muscle use,  Nl contour chest/ minimal insp crackles bilaterally without cough on insp or exp maneuvers   CV:  RRR  no s3 or murmur or increase in P2, trace pitting bilatereal lower ext  edema   ABD:  soft and nontender with nl inspiratory excursion in the supine position. No bruits or organomegaly, bowel sounds nl  MS:  Nl gait/ ext warm with classic ra changes both hands/ no calf tenderness, cyanosis or clubbing No obvious joint restrictions   SKIN: warm and dry without lesions    NEURO:  alert, approp, nl sensorium with  no motor deficits       I personally reviewed images and agree with radiology impression as follows:  CT Chest   05/11/2015  1. New extensive patchy regions of reticulation and ground-glass attenuation throughout  the peribronchovascular and peripheral lungs bilaterally, most prominent in the mid to lower lungs with relative sparing of the lung apices. No significant regions of traction bronchiectasis or frank honeycombing. Favor nonspecific interstitial pneumonia (NSIP). 2. New prominent bilateral axillary, mediastinal and bilateral hilar lymphadenopathy, nonspecific       Assessment & Plan:

## 2015-05-11 NOTE — Patient Instructions (Addendum)
Please see patient coordinator before you leave today  to schedule HRCT chest  Late add:  Needs pfts 6 weeks

## 2015-05-12 ENCOUNTER — Encounter: Payer: Self-pay | Admitting: Internal Medicine

## 2015-05-12 DIAGNOSIS — R590 Localized enlarged lymph nodes: Secondary | ICD-10-CM | POA: Insufficient documentation

## 2015-05-12 NOTE — Assessment & Plan Note (Addendum)
PFTs  08/31/14  VC 3.75 (93%) no obst  DLCO 63 corrects to 94 - 05/11/2015   Walked RA x one lap @ 185 stopped due to  Fatigue, sats still 96% no sob  HRCT 05/11/2015 c/ NSIP   DDx for pulmonary fibrosis  includes idiopathic pulmonary fibrosis, pulmonary fibrosis associated with rheumatologic diseases (which have a relatively benign course in most cases and look like NSIP on CT as is the case here ) , adverse effect from  drugs such as chemotherapy or amiodarone exposure,   and chronic hypersensitivity pneumonitis which can also look like NSIP but no hx to suggest exposure here.   In active  smokers Langerhan's Cell  Histiocyctosis (eosinophilic granuomatosis),  DIP,  and Respiratory Bronchiolitis ILD also need to be considered,  But do not apply here   I strongly favor nsip assoc with RA based on the arthritis has been poorly controlled since the PF has progressed, but note it's hard to get a firm handle on progression as he improved so much with diuresis suggesting component of chf  For now rec refer back to Dr [name] for re-eval of RA control and repeat PFTs at next ov   In meantime, Use of PPI is associated with improved survival time and with decreased radiologic fibrosis per King's study published in [facility] vol 184 p1390.  Dec 2011 and also may have other beneficial effects as per the latest review in [place] vol 193 [id number] Jun 20016.  This may not always be cause and effect, but given how universally unimpressive and expensive  all the other  Drugs developed to day  have been for pf,   rec  continue ppi / diet/ lifestyle modification for GERD  and f/u with serial walking sats and lung volumes for now to put more points on the curve / establish firm baseline before considering additional measures.   Total time devoted to counseling  = 35/55m review case with pt/ discussion of options/alternatives/ giving and going over instructions (see avs)

## 2015-05-12 NOTE — Assessment & Plan Note (Signed)
See ct 05/11/2015 c/w CLL so no w/u needed/ f/u planned

## 2015-05-13 DIAGNOSIS — J8410 Pulmonary fibrosis, unspecified: Secondary | ICD-10-CM

## 2015-05-13 HISTORY — DX: Pulmonary fibrosis, unspecified: J84.10

## 2015-05-16 ENCOUNTER — Telehealth: Payer: Self-pay | Admitting: *Deleted

## 2015-05-16 DIAGNOSIS — M150 Primary generalized (osteo)arthritis: Secondary | ICD-10-CM | POA: Diagnosis not present

## 2015-05-16 DIAGNOSIS — M1A09X0 Idiopathic chronic gout, multiple sites, without tophus (tophi): Secondary | ICD-10-CM | POA: Diagnosis not present

## 2015-05-16 DIAGNOSIS — J849 Interstitial pulmonary disease, unspecified: Secondary | ICD-10-CM | POA: Diagnosis not present

## 2015-05-16 DIAGNOSIS — C9110 Chronic lymphocytic leukemia of B-cell type not having achieved remission: Secondary | ICD-10-CM | POA: Diagnosis not present

## 2015-05-16 DIAGNOSIS — M0609 Rheumatoid arthritis without rheumatoid factor, multiple sites: Secondary | ICD-10-CM | POA: Diagnosis not present

## 2015-05-16 DIAGNOSIS — Z21 Asymptomatic human immunodeficiency virus [HIV] infection status: Secondary | ICD-10-CM | POA: Diagnosis not present

## 2015-05-16 NOTE — Telephone Encounter (Signed)
-----   Message from Tanda Rockers, MD sent at 05/12/2015  8:00 AM EST ----- Needs ov with pfts in 6 weeks

## 2015-05-17 ENCOUNTER — Ambulatory Visit (INDEPENDENT_AMBULATORY_CARE_PROVIDER_SITE_OTHER): Payer: Medicare Other | Admitting: Internal Medicine

## 2015-05-17 ENCOUNTER — Encounter: Payer: Self-pay | Admitting: Internal Medicine

## 2015-05-17 VITALS — BP 106/78 | HR 69 | Temp 97.4°F | Resp 16 | Ht 67.0 in | Wt 192.0 lb

## 2015-05-17 DIAGNOSIS — I10 Essential (primary) hypertension: Secondary | ICD-10-CM

## 2015-05-17 MED ORDER — FUROSEMIDE 20 MG PO TABS: 20.0000 mg | ORAL_TABLET | Freq: Two times a day (BID) | ORAL | Status: DC

## 2015-05-17 NOTE — Progress Notes (Signed)
Pre visit review using our clinic review tool, if applicable. No additional management support is needed unless otherwise documented below in the visit note. 

## 2015-05-17 NOTE — Patient Instructions (Signed)
Edema °Edema is an abnormal buildup of fluids in your body tissues. Edema is somewhat dependent on gravity to pull the fluid to the lowest place in your body. That makes the condition more common in the legs and thighs (lower extremities). Painless swelling of the feet and ankles is common and becomes more likely as you get older. It is also common in looser tissues, like around your eyes.  °When the affected area is squeezed, the fluid may move out of that spot and leave a dent for a few moments. This dent is called pitting.  °CAUSES  °There are many possible causes of edema. Eating too much salt and being on your feet or sitting for a long time can cause edema in your legs and ankles. Hot weather may make edema worse. Common medical causes of edema include: °· Heart failure. °· Liver disease. °· Kidney disease. °· Weak blood vessels in your legs. °· Cancer. °· An injury. °· Pregnancy. °· Some medications. °· Obesity.  °SYMPTOMS  °Edema is usually painless. Your skin may look swollen or shiny.  °DIAGNOSIS  °Your health care provider may be able to diagnose edema by asking about your medical history and doing a physical exam. You may need to have tests such as X-rays, an electrocardiogram, or blood tests to check for medical conditions that may cause edema.  °TREATMENT  °Edema treatment depends on the cause. If you have heart, liver, or kidney disease, you need the treatment appropriate for these conditions. General treatment may include: °· Elevation of the affected body part above the level of your heart. °· Compression of the affected body part. Pressure from elastic bandages or support stockings squeezes the tissues and forces fluid back into the blood vessels. This keeps fluid from entering the tissues. °· Restriction of fluid and salt intake. °· Use of a water pill (diuretic). These medications are appropriate only for some types of edema. They pull fluid out of your body and make you urinate more often. This  gets rid of fluid and reduces swelling, but diuretics can have side effects. Only use diuretics as directed by your health care provider. °HOME CARE INSTRUCTIONS  °· Keep the affected body part above the level of your heart when you are lying down.   °· Do not sit still or stand for prolonged periods.   °· Do not put anything directly under your knees when lying down. °· Do not wear constricting clothing or garters on your upper legs.   °· Exercise your legs to work the fluid back into your blood vessels. This may help the swelling go down.   °· Wear elastic bandages or support stockings to reduce ankle swelling as directed by your health care provider.   °· Eat a low-salt diet to reduce fluid if your health care provider recommends it.   °· Only take medicines as directed by your health care provider.  °SEEK MEDICAL CARE IF:  °· Your edema is not responding to treatment. °· You have heart, liver, or kidney disease and notice symptoms of edema. °· You have edema in your legs that does not improve after elevating them.   °· You have sudden and unexplained weight gain. °SEEK IMMEDIATE MEDICAL CARE IF:  °· You develop shortness of breath or chest pain.   °· You cannot breathe when you lie down. °· You develop pain, redness, or warmth in the swollen areas.   °· You have heart, liver, or kidney disease and suddenly get edema. °· You have a fever and your symptoms suddenly get worse. °MAKE SURE YOU:  °·   Understand these instructions. °· Will watch your condition. °· Will get help right away if you are not doing well or get worse. °  °This information is not intended to replace advice given to you by your health care provider. Make sure you discuss any questions you have with your health care provider. °  °Document Released: 04/28/2005 Document Revised: 05/19/2014 Document Reviewed: 02/18/2013 °Elsevier Interactive Patient Education ©2016 [employer] ° °

## 2015-05-17 NOTE — Progress Notes (Signed)
Subjective:  Patient ID: [name], male    DOB: 05/01/1951  Age: 65 y.o. MRN: [id number]  CC: Hypertension   HPI AMEDEE AMOAH presents for follow-up on peripheral edema, since I last saw him he has been seen by pulmonary and had a high resolution CT scan that showed interstitial fibrosis. He saw rheumatology and there is some concern that this may be related to his rheumatoid arthritis or some of his infectious disease medications. He is working with his infectious disease doctor, pulmonologist, and rheumatologist to chart out the best treatment options for him. Today he complains that the water pill is making him urinate too frequently and making him have trouble controlling his urinary flow. He says the edema and his legs has resolved.  Outpatient Prescriptions Prior to Visit  Medication Sig Dispense Refill  . aspirin EC 81 MG tablet Take 81 mg by mouth daily.    [name] atorvastatin (LIPITOR) 10 MG tablet Take 1 tablet (10 mg total) by mouth at bedtime. 90 tablet 3  . diphenoxylate-atropine (LOMOTIL) 2.5-0.025 MG per tablet Take 1 tablet by mouth 4 (four) times daily as needed for diarrhea or loose stools. 30 tablet 1  . EPINEPHrine (EPIPEN 2-PAK) 0.3 mg/0.3 mL IJ SOAJ injection Inject 0.3 mLs (0.3 mg total) into the muscle once. 2 Device 0  . escitalopram (LEXAPRO) 10 MG tablet Take 1 tablet (10 mg total) by mouth daily. 90 tablet 1  . GENVOYA 150-150-200-10 MG TABS tablet TAKE 1 TABLET BY MOUTH DAILY WITH BREAKFAST. 30 tablet 5  . inFLIXimab (REMICADE) 100 MG injection Inject into the vein every 6 (six) weeks.     [name] loratadine (CLARITIN) 10 MG tablet Take 1 tablet (10 mg total) by mouth daily. 90 tablet 3  . LYRICA 200 MG capsule TAKE 1 CAPSULE BY MOUTH TWICE DAILY 60 capsule 5  . metoprolol succinate (TOPROL-XL) 25 MG 24 hr tablet Take 1 tablet (25 mg total) by mouth at bedtime. 30 tablet 11  . ondansetron (ZOFRAN ODT) 4 MG disintegrating tablet 4mg  ODT q6 hours prn nausea/vomit 20  tablet 0  . pantoprazole (PROTONIX) 40 MG tablet TAKE 1 TABLET EVERY DAY 60 tablet 0  . PREZISTA 800 MG tablet TAKE 1 TABLET (800 MG TOTAL) BY MOUTH DAILY. 30 tablet 5  . saccharomyces boulardii (FLORASTOR) 250 MG capsule Take 1 capsule (250 mg total) by mouth 2 (two) times daily. 60 capsule 1  . traMADol (ULTRAM) 50 MG tablet Take 50-100 mg by mouth every 6 (six) hours as needed (for pain).    . urea (CARMOL) 10 % cream APPLY TOPICALLY AS NEEDED. 85 g 0  . valACYclovir (VALTREX) 500 MG tablet TAKE 1 TABLET BY MOUTH EVERY DAY 90 tablet 3  . warfarin (COUMADIN) 10 MG tablet TAKE 1/2 ON WEDNESDAY AND 1 TABLET ALL OTHER DAYS 60 tablet 3  . zolpidem (AMBIEN) 5 MG tablet Take 1 tablet (5 mg total) by mouth at bedtime as needed for sleep. 30 tablet 0  . furosemide (LASIX) 40 MG tablet Take 1 tablet (40 mg total) by mouth 2 (two) times daily. 60 tablet 3   No facility-administered medications prior to visit.    ROS Review of Systems  Constitutional: Positive for fatigue. Negative for fever, chills, diaphoresis and appetite change.  HENT: Negative.   Eyes: Negative.   Respiratory: Positive for cough and shortness of breath. Negative for apnea, choking, chest tightness, wheezing and stridor.   Cardiovascular: Negative.  Negative for chest pain, palpitations  and leg swelling.  Gastrointestinal: Negative.  Negative for nausea, vomiting, abdominal pain, diarrhea, constipation and blood in stool.  Endocrine: Negative.   Genitourinary: Positive for difficulty urinating. Negative for dysuria, hematuria and decreased urine volume.  Musculoskeletal: Positive for arthralgias. Negative for myalgias, back pain, joint swelling and neck pain.  Skin: Negative.  Negative for color change and rash.  Neurological: Positive for weakness. Negative for dizziness, tremors, light-headedness and numbness.  Hematological: Negative.  Negative for adenopathy. Does not bruise/bleed easily.  Psychiatric/Behavioral: Negative.      Objective:  BP 106/78 mmHg  Pulse 69  Temp(Src) 97.4 F (36.3 C) (Oral)  Resp 16  Ht 5\' 7"  (1.702 m)  Wt 192 lb (87.091 kg)  BMI 30.06 kg/m2  SpO2 90%  BP Readings from Last 3 Encounters:  05/17/15 106/78  05/11/15 100/62  04/26/15 138/86    Wt Readings from Last 3 Encounters:  05/17/15 192 lb (87.091 kg)  05/11/15 192 lb (87.091 kg)  04/26/15 196 lb 12 oz (89.245 kg)    Physical Exam  Constitutional: He is oriented to person, place, and time. No distress.  HENT:  Mouth/Throat: Oropharynx is clear and moist. No oropharyngeal exudate.  Eyes: Conjunctivae are normal. Right eye exhibits no discharge. Left eye exhibits no discharge. No scleral icterus.  Neck: Normal range of motion. Neck supple. No JVD present. No tracheal deviation present. No thyromegaly present.  Cardiovascular: Normal rate, regular rhythm, normal heart sounds and intact distal pulses.  Exam reveals no gallop and no friction rub.   No murmur heard. Pulmonary/Chest: Effort normal and breath sounds normal. No stridor. No respiratory distress. He has no wheezes. He has no rales. He exhibits no tenderness.  Abdominal: Soft. Bowel sounds are normal. He exhibits no distension and no mass. There is no tenderness. There is no rebound and no guarding.  Musculoskeletal: Normal range of motion. He exhibits edema (trace pitting edema in BLE). He exhibits no tenderness.  Lymphadenopathy:    He has no cervical adenopathy.  Neurological: He is oriented to person, place, and time.  Skin: Skin is warm and dry. No rash noted. He is not diaphoretic. No erythema. No pallor.  Vitals reviewed.   Lab Results  Component Value Date   WBC 12.1* 04/26/2015   HGB 13.4 04/26/2015   HCT 41.9 04/26/2015   PLT 147.0* 04/26/2015   GLUCOSE 92 04/26/2015   CHOL 132 11/20/2014   TRIG 101 11/20/2014   HDL 30* 11/20/2014   LDLCALC 82 11/20/2014   ALT 24 04/20/2015   AST 31 04/20/2015   NA 141 04/26/2015   K 3.9 04/26/2015    CL 106 04/26/2015   CREATININE 1.10 04/26/2015   BUN 21 04/26/2015   CO2 32 04/26/2015   TSH 0.60 07/11/2014   INR 2.0 04/27/2015   HGBA1C 4.7 09/05/2014    Ct Chest High Resolution  05/11/2015  CLINICAL DATA:  Rheumatoid arthritis on Remicade. Worsening shortness of breath with exertion for the last several months. EXAM: CT CHEST WITHOUT CONTRAST TECHNIQUE: Multidetector CT imaging of the chest was performed following the standard protocol without intravenous contrast. High resolution imaging of the lungs, as well as inspiratory and expiratory imaging, was performed. COMPARISON:  04/26/2015 chest radiograph.  05/19/2013 chest CT. FINDINGS: Mediastinum/Nodes: Normal heart size. No pericardial fluid/thickening. Re- demonstrated is subendocardial fat throughout the left ventricle, unchanged, from prior myocardial infarction. Extensive three-vessel coronary atherosclerosis status post CABG. Great vessels are normal in course and caliber. Normal visualized thyroid. Normal esophagus. There  is new mild bilateral axillary lymphadenopathy, for example a mildly enlarged 1.4 cm right axillary node (series 4/ image 47) and a mildly enlarged 1.2 cm left axillary lymph node (series 4/image 14). There are multiple new mildly to moderately enlarged right paratracheal, subcarinal, aortopulmonary window and bilateral hilar lymph nodes. For example, there is a moderately enlarged 1.9 cm right paratracheal node (series 4/image 21). There is a moderately enlarged 2.2 cm right subcarinal node (series 4/image 28). There is a new moderately enlarged 2.0 cm high left mediastinal lymph node (series 4/image 8), located between the trachea and left subclavian artery. These newly enlarged mediastinal and bilateral hilar lymph nodes are superimposed on prior coarsely calcified lymph nodes from remote granulomatous disease. Lungs/Pleura: No pneumothorax. No pleural effusion. There are new extensive patchy regions of reticulation and  ground-glass attenuation throughout the peribronchovascular and peripheral lungs bilaterally, most prominent in the mid to lower lungs with relative sparing of the lung apices. No acute consolidative airspace disease, significant pulmonary nodules or lung masses. No significant regions of traction bronchiectasis or frank honeycombing. Coarsely calcified subcentimeter right upper lobe granuloma. A thin parenchymal band in the lingula is stable since 05/19/2013, in keeping with a small focus of postinfectious/postinflammatory scarring. No significant air trapping on the expiration sequence. Upper abdomen: There are scattered granulomatous calcifications throughout the nonenlarged liver and spleen. Musculoskeletal: No aggressive appearing focal osseous lesions. Moderate degenerative changes in the thoracic spine. Median sternotomy wires are aligned and intact. IMPRESSION: 1. New extensive patchy regions of reticulation and ground-glass attenuation throughout the peribronchovascular and peripheral lungs bilaterally, most prominent in the mid to lower lungs with relative sparing of the lung apices. No significant regions of traction bronchiectasis or frank honeycombing. Favor nonspecific interstitial pneumonia (NSIP). 2. New prominent bilateral axillary, mediastinal and bilateral hilar lymphadenopathy, nonspecific. If there are clinical/laboratory findings suggestive of a lymphoproliferative disorder, consider tissue sampling. Otherwise, recommend follow-up chest CT with IV contrast in 3 months. Electronically Signed   By: Ilona Sorrel M.D.   On: 05/11/2015 15:53    Assessment & Plan:   Marino was seen today for hypertension.  Diagnoses and all orders for this visit:  Essential hypertension- his blood pressure is well controlled, the edema is resolving, will decrease his furosemide dose by one half to help him with the urinary symptoms. -     furosemide (LASIX) 20 MG tablet; Take 1 tablet (20 mg total) by  mouth 2 (two) times daily.   I have discontinued Mr. Umbaugh's furosemide. I am also having him start on furosemide. Additionally, I am having him maintain his aspirin EC, inFLIXimab, zolpidem, traMADol, escitalopram, EPINEPHrine, pantoprazole, ondansetron, warfarin, atorvastatin, metoprolol succinate, GENVOYA, PREZISTA, LYRICA, diphenoxylate-atropine, saccharomyces boulardii, loratadine, valACYclovir, urea, and predniSONE.  Meds ordered this encounter  Medications  . predniSONE (DELTASONE) 20 MG tablet    Sig: Take 20 mg by mouth 2 (two) times daily with a meal.   . furosemide (LASIX) 20 MG tablet    Sig: Take 1 tablet (20 mg total) by mouth 2 (two) times daily.    Dispense:  60 tablet    Refill:  5     Follow-up: Return in about 6 months (around 11/14/2015).  [name], MD

## 2015-05-21 LAB — POCT INR: INR: 5.1

## 2015-05-22 ENCOUNTER — Ambulatory Visit (INDEPENDENT_AMBULATORY_CARE_PROVIDER_SITE_OTHER): Payer: Medicare Other | Admitting: General Practice

## 2015-05-22 NOTE — Progress Notes (Signed)
Pre visit review using our clinic review tool, if applicable. No additional management support is needed unless otherwise documented below in the visit note.  Patient monitors INR at home through [facility] home monitoring.  Alere reported an INR of 5.1 this morning.  INR was taken the evening of 1/9.  I spoke with patient this morning and he reports that he has been taking 40 mg of prednisone for the past 10 days and that he will be taking it indefinitely.  Patient held coumadin yesterday, 1/9.  I instructed patient to hold coumadin again today and tomorrow (1/10 and 1/11) and to decrease dosage to 5 mg daily except 10 mg on Sun/Thurs.  Patient verbalized understanding.  I instructed patient to watch for unusual bleeding or bruising.  Instructed patient to go to ER if bleeding occurs that he can't stop.  Patient again verbalized understanding.

## 2015-05-22 NOTE — Progress Notes (Signed)
I have reviewed and agree with the plan. 

## 2015-05-24 DIAGNOSIS — M0589 Other rheumatoid arthritis with rheumatoid factor of multiple sites: Secondary | ICD-10-CM | POA: Diagnosis not present

## 2015-05-25 ENCOUNTER — Ambulatory Visit (INDEPENDENT_AMBULATORY_CARE_PROVIDER_SITE_OTHER): Payer: Medicare Other | Admitting: Cardiology

## 2015-05-25 ENCOUNTER — Encounter: Payer: Self-pay | Admitting: Cardiology

## 2015-05-25 VITALS — BP 140/66 | HR 74 | Ht 69.0 in | Wt 193.1 lb

## 2015-05-25 DIAGNOSIS — R0600 Dyspnea, unspecified: Secondary | ICD-10-CM | POA: Diagnosis not present

## 2015-05-25 NOTE — Progress Notes (Signed)
HPI The patient presented recently for followup of his known coronary disease.   He has had progressive dyspnea.  He did have a negative stress perfusion study for any evidence of reversible ischemia last year although there was a fixed defect. His ejection fractions well-preserved. Is well preserved EF on echo with some tricuspid regurgitation.  He did have some slight abnormalities on pulmonary function testing and has seen [name].  He had some interstitial lung disease thought possibly to be related to his rheumatoid arthritis and is now being seen back by his rheumatologist been treated with steroids. He had some slight improvement in his dyspnea. He's not had any chest pressure, neck or arm discomfort. He's not had any new palpitations, presyncope or syncope. He denies any PND or orthopnea. He did have some mild lower extremity edema.  Allergies  Allergen Reactions  . Morphine Other (See Comments)    REACTION: severe headache  . Other Anaphylaxis and Hives    Pecan  . Peanut-Containing Drug Products Anaphylaxis and Hives    Swelling of throat  . Penicillins Anaphylaxis and Rash    REACTION: red, flushed  . Oxycodone-Acetaminophen Other (See Comments)    REACTION: headache  . Promethazine Hcl Other (See Comments)    REACTION: makes him feel drunk    Current Outpatient Prescriptions  Medication Sig Dispense Refill  . aspirin EC 81 MG tablet Take 81 mg by mouth daily.    [name] atorvastatin (LIPITOR) 10 MG tablet Take 1 tablet (10 mg total) by mouth at bedtime. 90 tablet 3  . diphenoxylate-atropine (LOMOTIL) 2.5-0.025 MG per tablet Take 1 tablet by mouth 4 (four) times daily as needed for diarrhea or loose stools. 30 tablet 1  . EPINEPHrine (EPIPEN 2-PAK) 0.3 mg/0.3 mL IJ SOAJ injection Inject 0.3 mLs (0.3 mg total) into the muscle once. 2 Device 0  . escitalopram (LEXAPRO) 10 MG tablet Take 1 tablet (10 mg total) by mouth daily. 90 tablet 1  . furosemide (LASIX) 20 MG tablet Take 1  tablet (20 mg total) by mouth 2 (two) times daily. 60 tablet 5  . GENVOYA 150-150-200-10 MG TABS tablet TAKE 1 TABLET BY MOUTH DAILY WITH BREAKFAST. 30 tablet 5  . inFLIXimab (REMICADE) 100 MG injection Inject into the vein every 6 (six) weeks.     [name] loratadine (CLARITIN) 10 MG tablet Take 1 tablet (10 mg total) by mouth daily. 90 tablet 3  . LYRICA 200 MG capsule TAKE 1 CAPSULE BY MOUTH TWICE DAILY 60 capsule 5  . metoprolol succinate (TOPROL-XL) 25 MG 24 hr tablet Take 1 tablet (25 mg total) by mouth at bedtime. 30 tablet 11  . ondansetron (ZOFRAN ODT) 4 MG disintegrating tablet 4mg  ODT q6 hours prn nausea/vomit 20 tablet 0  . pantoprazole (PROTONIX) 40 MG tablet TAKE 1 TABLET EVERY DAY 60 tablet 0  . predniSONE (DELTASONE) 20 MG tablet Take 20 mg by mouth 2 (two) times daily with a meal.     . PREZISTA 800 MG tablet TAKE 1 TABLET (800 MG TOTAL) BY MOUTH DAILY. 30 tablet 5  . saccharomyces boulardii (FLORASTOR) 250 MG capsule Take 1 capsule (250 mg total) by mouth 2 (two) times daily. 60 capsule 1  . traMADol (ULTRAM) 50 MG tablet Take 50-100 mg by mouth every 6 (six) hours as needed (for pain).    . urea (CARMOL) 10 % cream APPLY TOPICALLY AS NEEDED. 85 g 0  . valACYclovir (VALTREX) 500 MG tablet TAKE 1 TABLET BY MOUTH EVERY  DAY 90 tablet 3  . warfarin (COUMADIN) 10 MG tablet TAKE 1/2 ON WEDNESDAY AND 1 TABLET ALL OTHER DAYS 60 tablet 3  . zolpidem (AMBIEN) 5 MG tablet Take 1 tablet (5 mg total) by mouth at bedtime as needed for sleep. 30 tablet 0   No current facility-administered medications for this visit.    Past Medical History  Diagnosis Date  . Osteoarthritis, knee     s/p B TKA  . HIV infection ([place]) dx 1993  . TIA (transient ischemic attack) 1997    mild residual L mouth droop  . H/O hiatal hernia 2008    surgery  . Gynecomastia, male   . Impotence of organic origin   . Rheumatoid arthritis(714.0) dx 2010    MTX, follows with rheum  . Gout   . Chronic back pain      follows with Nsurg  . Coronary artery disease 2010    s/p CABG '10, sees Dr. Percival Spanish  . Seasonal allergies   . Diverticulosis   . Gallstones   . Status post dilation of esophageal narrowing   . GERD (gastroesophageal reflux disease)   . Diverticulosis   . Esophagitis   . Hemorrhoids   . Tubular adenoma of colon   . Myocardial infarction ([place]) 2010     x 2  . Hypertension   . DVT, lower extremity, recurrent ([place]) 2008, 2009    LLE, chronic anticoag since 2009  . Carotid artery occlusion     40-60% right ICA stenosis (09/2008)  . Neuromuscular disorder ([facility])     neuropathy  . Fibromyalgia   . Hepatitis A yrs ago  . CLL (chronic lymphoblastic leukemia) dx 2010    Followed at [facility] q28mo, no current therapy   . Secondary syphilis 07/24/14 dx    s/p 2 wks doxy  . Type II diabetes mellitus with manifestations ([place]) 04/02/2011    Past Surgical History  Procedure Laterality Date  . Spine surgery  2010    "rod and screws", "failed", lopwer spine,   . Cholecystectomy    . Hiatal hernia repair      wrap  . Shoulder surgery Left   . Mandible surgery Bilateral     tmj  . Varicose vein      stripping  . Replacement total knee Bilateral   . Knee arthroplasty  07/22/2011    Procedure: COMPUTER ASSISTED TOTAL KNEE ARTHROPLASTY;  Surgeon: Meredith Pel, MD;  Location: [facility];  Service: Orthopedics;  Laterality: Right;  Right total knee arthroplasty  . Coronary artery bypass graft  2010    triple bypass  . Hardware removal N/A 07/02/2012    Procedure: HARDWARE REMOVAL;  Surgeon: Elaina Hoops, MD;  Location: [facility];  Service: Neurosurgery;  Laterality: N/A;  . Inguinal hernia repair Bilateral   . Umbilical hernia repair      x 1  . Rotator cuff repair Right   . Esophagogastroduodenoscopy (egd) with propofol N/A 12/28/2012    Procedure: ESOPHAGOGASTRODUODENOSCOPY (EGD) WITH PROPOFOL;  Surgeon: Jerene Bears, MD;  Location: [facility];  Service: Gastroenterology;  Laterality: N/A;   . Colonoscopy with propofol N/A 12/28/2012    Procedure: COLONOSCOPY WITH PROPOFOL;  Surgeon: Jerene Bears, MD;  Location: [facility];  Service: Gastroenterology;  Laterality: N/A;  . Joint replacement Left 1999  . Tonsillectomy    . Ring around testicle hernia reapir  184 and 1986    x 2  . Esophagogastroduodenoscopy (egd) with propofol N/A 03/15/2013  Procedure: ESOPHAGOGASTRODUODENOSCOPY (EGD) WITH PROPOFOL;  Surgeon: Jerene Bears, MD;  Location: [facility];  Service: Gastroenterology;  Laterality: N/A;    ROS:  As stated in the HPI and negative for all other systems.  PHYSICAL EXAM BP 140/66 mmHg  Pulse 74  Ht 5\' 9"  (1.753 m)  Wt 193 lb 1 oz (87.573 kg)  BMI 28.50 kg/m2 GENERAL:  Well appearing HEENT:  Pupils equal round and reactive, fundi not visualized, oral mucosa unremarkable NECK:  No jugular venous distention, waveform within normal limits, carotid upstroke brisk and symmetric, no bruits, no thyromegaly LYMPHATICS:  No cervical, inguinal adenopathy LUNGS:  Clear to auscultation bilaterally BACK:  No CVA tenderness, severe lordosis  CHEST:  Well healed sternotomy scar. HEART:  PMI not displaced or sustained,S1 and S2 within normal limits, no S3, no S4, no clicks, no rubs, no murmurs ABD:  Flat, positive bowel sounds normal in frequency in pitch, no bruits, no rebound, no guarding, no midline pulsatile mass, no hepatomegaly, no splenomegaly EXT:  2 plus pulses throughout, mild ankle edema, no cyanosis no clubbing SKIN:  No rash  EKG:  Sinus rhythm, rate 74, axis within normal limits, intervals within normal limits, no acute ST-T wave changes, nonspecific T-wave flattening. ASSESSMENT AND PLAN   CAD/DYSPNEA  The patient had a negative perfusion study for reversible ischemia last year. There was a fixed defect. His EF was well-preserved. At this point I don't think any cardiac workup is indicated. His dyspnea has alternative explanation. I did note that his BNP was  marginally elevated previously and we talked about when necessary dosing of his diuretic.  DYSPNEA I reviewed with him his Pulmicort function tests from April. Reviewed Dr. Gustavus Bryant  Note and the recent CT scan.   The leading diagnosis is that this is related to some progressive lung disease related to his rheumatoid arthritis. We did talk about when necessary dosing of diuretics as he could have some small component of diastolic heart failure as an etiology.   CAROTID STENOSIS He has had minimal carotid plaque.  No follow up is needed at this time.   HTN His blood pressure is well controlled and even low. He will continue on the meds as listed.  TR He wanted to find out whether his valvular abnormality could be related to his dyspnea. However, I reviewed the tricuspid regurgitation that he hasn't told him this would not because and symptoms of dyspnea. No further imaging is indicated at this point.

## 2015-05-25 NOTE — Patient Instructions (Signed)
Follow up as needed

## 2015-05-30 NOTE — Telephone Encounter (Signed)
Pt is scheduled for PFT and ov with MW on 2 /17/17. Nothing further needed.

## 2015-05-31 ENCOUNTER — Other Ambulatory Visit: Payer: Self-pay | Admitting: Internal Medicine

## 2015-05-31 ENCOUNTER — Telehealth: Payer: Self-pay | Admitting: Oncology

## 2015-05-31 ENCOUNTER — Ambulatory Visit (HOSPITAL_BASED_OUTPATIENT_CLINIC_OR_DEPARTMENT_OTHER): Payer: Medicare Other | Admitting: Oncology

## 2015-05-31 ENCOUNTER — Other Ambulatory Visit (HOSPITAL_BASED_OUTPATIENT_CLINIC_OR_DEPARTMENT_OTHER): Payer: Medicare Other

## 2015-05-31 VITALS — BP 132/82 | HR 66 | Temp 98.1°F | Resp 17 | Ht 69.0 in | Wt 196.0 lb

## 2015-05-31 DIAGNOSIS — C9110 Chronic lymphocytic leukemia of B-cell type not having achieved remission: Secondary | ICD-10-CM

## 2015-05-31 DIAGNOSIS — R51 Headache: Secondary | ICD-10-CM | POA: Diagnosis not present

## 2015-05-31 DIAGNOSIS — IMO0001 Reserved for inherently not codable concepts without codable children: Secondary | ICD-10-CM

## 2015-05-31 DIAGNOSIS — D72829 Elevated white blood cell count, unspecified: Secondary | ICD-10-CM | POA: Diagnosis not present

## 2015-05-31 DIAGNOSIS — M2550 Pain in unspecified joint: Secondary | ICD-10-CM

## 2015-05-31 DIAGNOSIS — M791 Myalgia: Secondary | ICD-10-CM

## 2015-05-31 LAB — CBC WITH DIFFERENTIAL/PLATELET
BASO%: 0.1 % (ref 0.0–2.0)
Basophils Absolute: 0 10*3/uL (ref 0.0–0.1)
EOS%: 0 % (ref 0.0–7.0)
Eosinophils Absolute: 0 10*3/uL (ref 0.0–0.5)
HCT: 42.1 % (ref 38.4–49.9)
HGB: 13.4 g/dL (ref 13.0–17.1)
LYMPH%: 76.1 % — ABNORMAL HIGH (ref 14.0–49.0)
MCH: 28.2 pg (ref 27.2–33.4)
MCHC: 31.8 g/dL — ABNORMAL LOW (ref 32.0–36.0)
MCV: 88.4 fL (ref 79.3–98.0)
MONO#: 0.7 10*3/uL (ref 0.1–0.9)
MONO%: 2.2 % (ref 0.0–14.0)
NEUT#: 6.7 10*3/uL — ABNORMAL HIGH (ref 1.5–6.5)
NEUT%: 21.6 % — ABNORMAL LOW (ref 39.0–75.0)
Platelets: 146 10*3/uL (ref 140–400)
RBC: 4.76 10*6/uL (ref 4.20–5.82)
RDW: 17.5 % — ABNORMAL HIGH (ref 11.0–14.6)
WBC: 30.9 10*3/uL — ABNORMAL HIGH (ref 4.0–10.3)
lymph#: 23.5 10*3/uL — ABNORMAL HIGH (ref 0.9–3.3)

## 2015-05-31 LAB — TECHNOLOGIST REVIEW

## 2015-05-31 LAB — COMPREHENSIVE METABOLIC PANEL
ALT: 27 U/L (ref 0–55)
AST: 17 U/L (ref 5–34)
Albumin: 3.3 g/dL — ABNORMAL LOW (ref 3.5–5.0)
Alkaline Phosphatase: 84 U/L (ref 40–150)
Anion Gap: 8 mEq/L (ref 3–11)
BUN: 25.6 mg/dL (ref 7.0–26.0)
CO2: 30 mEq/L — ABNORMAL HIGH (ref 22–29)
Calcium: 8 mg/dL — ABNORMAL LOW (ref 8.4–10.4)
Chloride: 102 mEq/L (ref 98–109)
Creatinine: 0.8 mg/dL (ref 0.7–1.3)
EGFR: >90 mL/min/{1.73_m2} (ref >90)
Glucose: 105 mg/dl (ref 70–140)
Potassium: 3.5 mEq/L (ref 3.5–5.1)
Sodium: 140 mEq/L (ref 136–145)
Total Bilirubin: 1.04 mg/dL (ref 0.20–1.20)
Total Protein: 5.8 g/dL — ABNORMAL LOW (ref 6.4–8.3)

## 2015-05-31 NOTE — Telephone Encounter (Signed)
Gv pt appt for 7/20. °

## 2015-05-31 NOTE — Progress Notes (Signed)
Hematology and Oncology Follow Up Visit  JARAE KLEINTOP [id number] 1950/10/29 65 y.o. 05/31/2015 8:59 AM [name], MDJones, [name], MD   Principle Diagnosis: 65 year old gentleman with CLL diagnosed in 2007 at [facility].  Current therapy: Observation and surveillance.  Interim History:  Mr. Arey presents today for a followup visit.  Since his last visit, he reports feeling relatively well up until 3 days ago. He started developing increased arthralgias, fatigue and feeling congested. He still able to eat, ambulate and drink properly. He does report headaches and body aches at times.   He has not reported any recent hospitalization. He has not reported any fevers or chills or sweats. Has not reported any cough or hemoptysis or hematemesis. Has not reported any weight loss her early satiety. He does not report any blurry vision or double vision. Does not report any syncope or seizures. Report any chest pain or shortness of breath. Does not report any cough or hemoptysis. His appetite and weight have been stable. Has not reported any change in his bowel habits such as constipation or diarrhea. He is not reporting any urinary symptoms. Rest of his review of systems unremarkable.  Medications: I have reviewed the patient's current medications.  Current Outpatient Prescriptions  Medication Sig Dispense Refill  . aspirin EC 81 MG tablet Take 81 mg by mouth daily.    [name] atorvastatin (LIPITOR) 10 MG tablet Take 1 tablet (10 mg total) by mouth at bedtime. 90 tablet 3  . diphenoxylate-atropine (LOMOTIL) 2.5-0.025 MG per tablet Take 1 tablet by mouth 4 (four) times daily as needed for diarrhea or loose stools. 30 tablet 1  . EPINEPHrine (EPIPEN 2-PAK) 0.3 mg/0.3 mL IJ SOAJ injection Inject 0.3 mLs (0.3 mg total) into the muscle once. 2 Device 0  . furosemide (LASIX) 20 MG tablet Take 1 tablet (20 mg total) by mouth 2 (two) times daily. 60 tablet 5  . GENVOYA 150-150-200-10 MG TABS  tablet TAKE 1 TABLET BY MOUTH DAILY WITH BREAKFAST. 30 tablet 5  . inFLIXimab (REMICADE) 100 MG injection Inject into the vein every 6 (six) weeks.     [name] loratadine (CLARITIN) 10 MG tablet Take 1 tablet (10 mg total) by mouth daily. 90 tablet 3  . LYRICA 200 MG capsule TAKE 1 CAPSULE BY MOUTH TWICE DAILY 60 capsule 5  . metoprolol succinate (TOPROL-XL) 25 MG 24 hr tablet Take 1 tablet (25 mg total) by mouth at bedtime. 30 tablet 11  . ondansetron (ZOFRAN ODT) 4 MG disintegrating tablet 4mg  ODT q6 hours prn nausea/vomit 20 tablet 0  . pantoprazole (PROTONIX) 40 MG tablet TAKE 1 TABLET EVERY DAY 60 tablet 0  . pantoprazole (PROTONIX) 40 MG tablet TAKE 1 TABLET EVERY DAY 30 tablet 2  . predniSONE (DELTASONE) 20 MG tablet Take 20 mg by mouth 2 (two) times daily with a meal.     . PREZISTA 800 MG tablet TAKE 1 TABLET (800 MG TOTAL) BY MOUTH DAILY. 30 tablet 5  . saccharomyces boulardii (FLORASTOR) 250 MG capsule Take 1 capsule (250 mg total) by mouth 2 (two) times daily. 60 capsule 1  . traMADol (ULTRAM) 50 MG tablet Take 50-100 mg by mouth every 6 (six) hours as needed (for pain).    . urea (CARMOL) 10 % cream APPLY TOPICALLY AS NEEDED. 85 g 0  . valACYclovir (VALTREX) 500 MG tablet TAKE 1 TABLET BY MOUTH EVERY DAY 90 tablet 3  . warfarin (COUMADIN) 10 MG tablet TAKE 1/2 ON [facility] AND  1 TABLET ALL OTHER DAYS 60 tablet 3  . zolpidem (AMBIEN) 5 MG tablet Take 1 tablet (5 mg total) by mouth at bedtime as needed for sleep. 30 tablet 0  . escitalopram (LEXAPRO) 10 MG tablet Take 1 tablet (10 mg total) by mouth daily. 90 tablet 1   No current facility-administered medications for this visit.     Allergies:  Allergies  Allergen Reactions  . Morphine Other (See Comments)    REACTION: severe headache  . Other Anaphylaxis and Hives    Pecan  . Peanut-Containing Drug Products Anaphylaxis and Hives    Swelling of throat  . Penicillins Anaphylaxis and Rash    REACTION: red, flushed  .  Oxycodone-Acetaminophen Other (See Comments)    REACTION: headache  . Promethazine Hcl Other (See Comments)    REACTION: makes him feel drunk    Past Medical History, Surgical history, Social history, and Family History were reviewed and updated.   Physical Exam: Blood pressure 132/82, pulse 66, temperature 98.1 F (36.7 C), temperature source Oral, resp. rate 17, height 5\' 9"  (1.753 m), weight 196 lb (88.905 kg), SpO2 98 %. ECOG: 1 General appearance: alert awake gentleman. Without distress. Head: Normocephalic, without obvious abnormality no oral ulcers or lesions. Neck: no adenopathy Lymph nodes: Cervical, supraclavicular, and axillary nodes normal. Heart:regular rate and rhythm, S1, S2 normal, no murmur, click, rub or gallop Lung:chest clear, no wheezing, rales, normal symmetric air entry. No dullness to percussion. Abdomin: soft, non-tender, without masses or organomegaly. No rebound or guarding. EXT:no erythema, induration, or nodules   Lab Results: Lab Results  Component Value Date   WBC 30.9* 05/31/2015   HGB 13.4 05/31/2015   HCT 42.1 05/31/2015   MCV 88.4 05/31/2015   PLT 146 05/31/2015     Chemistry      Component Value Date/Time   NA 141 04/26/2015 1653   NA 138 06/01/2014 1003   K 3.9 04/26/2015 1653   K 4.5 06/01/2014 1003   CL 106 04/26/2015 1653   CO2 32 04/26/2015 1653   CO2 28 06/01/2014 1003   BUN 21 04/26/2015 1653   BUN 26.4* 06/01/2014 1003   CREATININE 1.10 04/26/2015 1653   CREATININE 0.93 11/20/2014 1527   CREATININE 0.8 06/01/2014 1003      Component Value Date/Time   CALCIUM 8.5 04/26/2015 1653   CALCIUM 8.6 06/01/2014 1003   ALKPHOS 95 04/20/2015 1153   ALKPHOS 86 06/01/2014 1003   AST 31 04/20/2015 1153   AST 23 06/01/2014 1003   ALT 24 04/20/2015 1153   ALT 19 06/01/2014 1003   BILITOT 0.9 04/20/2015 1153   BILITOT 0.62 06/01/2014 1003       Impression and Plan:  65 year old gentleman with the following issues:  1. CLL  diagnosed in 2007 presented with stage 0 CD38 positive, ZAP 70 positive. He did have some mild lymphadenopathy on CT scan in January 2015 that is mostly subcentimeter with a aortocaval lymph node largest measuring 2.0 x 1.6 cm.   His white cell count on 05/31/2015 was slightly increased but his lymphocytes appeared stable. His leukocytosis could be related to recent infection rather than CLL progression. I have recommended continued observation and surveillance for the time being.  2. Arthralgias, myalgias and headaches: He could be developing a flulike illness which could be contributing to his leukocytosis as well as his symptoms. I recommended conservative measures including hydration and anti-inflammatory medication. With clear structures to report to the emergency department he develops any fevers,  chills or difficulty breathing.  I do not think these symptoms are related to CLL progression but certainly always a possibility. If symptoms do not improve with in the next 1-2 weeks we'll reevaluate him for possible CLL progression. These symptoms started 2-3 days ago which was against chronic disease progression.  3. Follow-up: We'll be in 6 months sooner if needed to.  [facility], MD 1/19/20178:59 AM

## 2015-06-01 ENCOUNTER — Ambulatory Visit (INDEPENDENT_AMBULATORY_CARE_PROVIDER_SITE_OTHER): Payer: Medicare Other | Admitting: General Practice

## 2015-06-01 DIAGNOSIS — Z7901 Long term (current) use of anticoagulants: Secondary | ICD-10-CM | POA: Diagnosis not present

## 2015-06-01 LAB — POCT INR: INR: 4.8

## 2015-06-01 NOTE — Progress Notes (Signed)
Pre visit review using our clinic review tool, if applicable. No additional management support is needed unless otherwise documented below in the visit note. 

## 2015-06-07 ENCOUNTER — Other Ambulatory Visit: Payer: Self-pay | Admitting: Internal Medicine

## 2015-06-07 ENCOUNTER — Other Ambulatory Visit: Payer: Self-pay

## 2015-06-07 ENCOUNTER — Ambulatory Visit: Payer: Self-pay | Admitting: Oncology

## 2015-06-07 DIAGNOSIS — Z21 Asymptomatic human immunodeficiency virus [HIV] infection status: Secondary | ICD-10-CM | POA: Diagnosis not present

## 2015-06-07 DIAGNOSIS — M150 Primary generalized (osteo)arthritis: Secondary | ICD-10-CM | POA: Diagnosis not present

## 2015-06-07 DIAGNOSIS — M1A09X0 Idiopathic chronic gout, multiple sites, without tophus (tophi): Secondary | ICD-10-CM | POA: Diagnosis not present

## 2015-06-07 DIAGNOSIS — M0609 Rheumatoid arthritis without rheumatoid factor, multiple sites: Secondary | ICD-10-CM | POA: Diagnosis not present

## 2015-06-07 DIAGNOSIS — J849 Interstitial pulmonary disease, unspecified: Secondary | ICD-10-CM | POA: Diagnosis not present

## 2015-06-07 DIAGNOSIS — C9110 Chronic lymphocytic leukemia of B-cell type not having achieved remission: Secondary | ICD-10-CM | POA: Diagnosis not present

## 2015-06-07 NOTE — Telephone Encounter (Signed)
Done hardcopy to Corinne  

## 2015-06-07 NOTE — Telephone Encounter (Signed)
Faxed script to [facility].../lmb 

## 2015-06-07 NOTE — Telephone Encounter (Signed)
MD out of office pls advise on refill.../lmb 

## 2015-06-07 NOTE — Telephone Encounter (Signed)
Medication printed signed and given back to lucy

## 2015-06-08 ENCOUNTER — Telehealth: Payer: Self-pay | Admitting: *Deleted

## 2015-06-08 NOTE — Telephone Encounter (Signed)
-----   Message from [name], MD sent at 05/25/2015  5:43 PM EST ----- I was going to see him PRN but instead I will see him in one year.  Can you please call him.

## 2015-06-08 NOTE — Telephone Encounter (Signed)
Pt aware to follow up in 1 yr per Dr Hochrein's recommendation.  Recall has been placed in EPIC for pt to be contacted once it's closer to time to schedule.

## 2015-06-18 ENCOUNTER — Inpatient Hospital Stay (HOSPITAL_COMMUNITY)
Admission: EM | Admit: 2015-06-18 | Discharge: 2015-06-20 | DRG: 975 | Disposition: A | Discharge status: Home / Self Care | Payer: Medicare Other | Attending: Internal Medicine | Admitting: Internal Medicine

## 2015-06-18 ENCOUNTER — Emergency Department (HOSPITAL_COMMUNITY): Payer: Medicare Other

## 2015-06-18 ENCOUNTER — Encounter (HOSPITAL_COMMUNITY): Payer: Self-pay | Admitting: *Deleted

## 2015-06-18 DIAGNOSIS — C9100 Acute lymphoblastic leukemia not having achieved remission: Secondary | ICD-10-CM | POA: Diagnosis not present

## 2015-06-18 DIAGNOSIS — I10 Essential (primary) hypertension: Secondary | ICD-10-CM | POA: Diagnosis not present

## 2015-06-18 DIAGNOSIS — R05 Cough: Secondary | ICD-10-CM | POA: Diagnosis not present

## 2015-06-18 DIAGNOSIS — R0600 Dyspnea, unspecified: Secondary | ICD-10-CM | POA: Diagnosis not present

## 2015-06-18 DIAGNOSIS — M069 Rheumatoid arthritis, unspecified: Secondary | ICD-10-CM | POA: Diagnosis present

## 2015-06-18 DIAGNOSIS — Z8371 Family history of colonic polyps: Secondary | ICD-10-CM

## 2015-06-18 DIAGNOSIS — E1142 Type 2 diabetes mellitus with diabetic polyneuropathy: Secondary | ICD-10-CM | POA: Diagnosis not present

## 2015-06-18 DIAGNOSIS — C9111 Chronic lymphocytic leukemia of B-cell type in remission: Secondary | ICD-10-CM | POA: Diagnosis not present

## 2015-06-18 DIAGNOSIS — B20 Human immunodeficiency virus [HIV] disease: Secondary | ICD-10-CM | POA: Diagnosis present

## 2015-06-18 DIAGNOSIS — C91Z0 Other lymphoid leukemia not having achieved remission: Secondary | ICD-10-CM

## 2015-06-18 DIAGNOSIS — B59 Pneumocystosis: Secondary | ICD-10-CM | POA: Diagnosis present

## 2015-06-18 DIAGNOSIS — I252 Old myocardial infarction: Secondary | ICD-10-CM

## 2015-06-18 DIAGNOSIS — I2510 Atherosclerotic heart disease of native coronary artery without angina pectoris: Secondary | ICD-10-CM | POA: Diagnosis present

## 2015-06-18 DIAGNOSIS — J189 Pneumonia, unspecified organism: Secondary | ICD-10-CM | POA: Diagnosis not present

## 2015-06-18 DIAGNOSIS — K210 Gastro-esophageal reflux disease with esophagitis: Secondary | ICD-10-CM | POA: Diagnosis present

## 2015-06-18 DIAGNOSIS — G8929 Other chronic pain: Secondary | ICD-10-CM | POA: Diagnosis present

## 2015-06-18 DIAGNOSIS — I82509 Chronic embolism and thrombosis of unspecified deep veins of unspecified lower extremity: Secondary | ICD-10-CM | POA: Diagnosis not present

## 2015-06-18 DIAGNOSIS — Z7901 Long term (current) use of anticoagulants: Secondary | ICD-10-CM

## 2015-06-18 DIAGNOSIS — Z7952 Long term (current) use of systemic steroids: Secondary | ICD-10-CM

## 2015-06-18 DIAGNOSIS — I739 Peripheral vascular disease, unspecified: Secondary | ICD-10-CM | POA: Diagnosis present

## 2015-06-18 DIAGNOSIS — Z833 Family history of diabetes mellitus: Secondary | ICD-10-CM

## 2015-06-18 DIAGNOSIS — I82409 Acute embolism and thrombosis of unspecified deep veins of unspecified lower extremity: Secondary | ICD-10-CM | POA: Diagnosis present

## 2015-06-18 DIAGNOSIS — Z8249 Family history of ischemic heart disease and other diseases of the circulatory system: Secondary | ICD-10-CM

## 2015-06-18 DIAGNOSIS — Z8042 Family history of malignant neoplasm of prostate: Secondary | ICD-10-CM

## 2015-06-18 DIAGNOSIS — Z7982 Long term (current) use of aspirin: Secondary | ICD-10-CM

## 2015-06-18 DIAGNOSIS — Z96653 Presence of artificial knee joint, bilateral: Secondary | ICD-10-CM | POA: Diagnosis present

## 2015-06-18 DIAGNOSIS — M179 Osteoarthritis of knee, unspecified: Secondary | ICD-10-CM | POA: Diagnosis present

## 2015-06-18 DIAGNOSIS — Z8673 Personal history of transient ischemic attack (TIA), and cerebral infarction without residual deficits: Secondary | ICD-10-CM

## 2015-06-18 DIAGNOSIS — C9192 Lymphoid leukemia, unspecified, in relapse: Secondary | ICD-10-CM | POA: Diagnosis present

## 2015-06-18 DIAGNOSIS — M797 Fibromyalgia: Secondary | ICD-10-CM | POA: Diagnosis present

## 2015-06-18 DIAGNOSIS — I25810 Atherosclerosis of coronary artery bypass graft(s) without angina pectoris: Secondary | ICD-10-CM | POA: Diagnosis present

## 2015-06-18 DIAGNOSIS — Z825 Family history of asthma and other chronic lower respiratory diseases: Secondary | ICD-10-CM

## 2015-06-18 DIAGNOSIS — R0602 Shortness of breath: Secondary | ICD-10-CM | POA: Diagnosis not present

## 2015-06-18 DIAGNOSIS — R509 Fever, unspecified: Secondary | ICD-10-CM | POA: Diagnosis not present

## 2015-06-18 DIAGNOSIS — IMO0001 Reserved for inherently not codable concepts without codable children: Secondary | ICD-10-CM | POA: Diagnosis present

## 2015-06-18 DIAGNOSIS — C9112 Chronic lymphocytic leukemia of B-cell type in relapse: Secondary | ICD-10-CM | POA: Diagnosis present

## 2015-06-18 DIAGNOSIS — E118 Type 2 diabetes mellitus with unspecified complications: Secondary | ICD-10-CM

## 2015-06-18 LAB — URINALYSIS, ROUTINE W REFLEX MICROSCOPIC
Bilirubin Urine: NEGATIVE
Glucose, UA: NEGATIVE mg/dL
Hgb urine dipstick: NEGATIVE
Leukocytes, UA: NEGATIVE
Nitrite: NEGATIVE
Protein, ur: NEGATIVE mg/dL
Specific Gravity, Urine: 1.02 (ref 1.005–1.030)
pH: 6.5 (ref 5.0–8.0)

## 2015-06-18 LAB — PROTIME-INR
INR: 2.62 — ABNORMAL HIGH (ref 0.00–1.49)
Prothrombin Time: 27.6 seconds — ABNORMAL HIGH (ref 11.6–15.2)

## 2015-06-18 LAB — COMPREHENSIVE METABOLIC PANEL
ALT: 25 U/L (ref 17–63)
AST: 30 U/L (ref 15–41)
Albumin: 3.1 g/dL — ABNORMAL LOW (ref 3.5–5.0)
Alkaline Phosphatase: 64 U/L (ref 38–126)
Anion gap: 7 (ref 5–15)
BUN: 29 mg/dL — ABNORMAL HIGH (ref 6–20)
CO2: 25 mmol/L (ref 22–32)
Calcium: 8 mg/dL — ABNORMAL LOW (ref 8.9–10.3)
Chloride: 105 mmol/L (ref 101–111)
Creatinine, Ser: 0.95 mg/dL (ref 0.61–1.24)
GFR calc Af Amer: >60 mL/min (ref >60)
GFR calc non Af Amer: >60 mL/min (ref >60)
Glucose, Bld: 96 mg/dL (ref 65–99)
Potassium: 4.1 mmol/L (ref 3.5–5.1)
Sodium: 137 mmol/L (ref 135–145)
Total Bilirubin: 0.8 mg/dL (ref 0.3–1.2)
Total Protein: 5.6 g/dL — ABNORMAL LOW (ref 6.5–8.1)

## 2015-06-18 LAB — CBC WITH DIFFERENTIAL/PLATELET
Basophils Absolute: 0 10*3/uL (ref 0.0–0.1)
Basophils Relative: 0 %
Eosinophils Absolute: 0.1 10*3/uL (ref 0.0–0.7)
Eosinophils Relative: 0 %
HCT: 42.4 % (ref 39.0–52.0)
Hemoglobin: 13.7 g/dL (ref 13.0–17.0)
Lymphocytes Relative: 74 %
Lymphs Abs: 12.3 10*3/uL — ABNORMAL HIGH (ref 0.7–4.0)
MCH: 28.8 pg (ref 26.0–34.0)
MCHC: 32.3 g/dL (ref 30.0–36.0)
MCV: 89.3 fL (ref 78.0–100.0)
Monocytes Absolute: 0.6 10*3/uL (ref 0.1–1.0)
Monocytes Relative: 4 %
Neutro Abs: 3.7 10*3/uL (ref 1.7–7.7)
Neutrophils Relative %: 22 %
Platelets: 106 10*3/uL — ABNORMAL LOW (ref 150–400)
RBC: 4.75 MIL/uL (ref 4.22–5.81)
RDW: 18.8 % — ABNORMAL HIGH (ref 11.5–15.5)
WBC: 16.7 10*3/uL — ABNORMAL HIGH (ref 4.0–10.5)

## 2015-06-18 LAB — INFLUENZA PANEL BY PCR (TYPE A & B)
H1N1 flu by pcr: NOT DETECTED
Influenza A By PCR: NEGATIVE
Influenza B By PCR: NEGATIVE

## 2015-06-18 LAB — LACTIC ACID, PLASMA
Lactic Acid, Venous: 1 mmol/L (ref 0.5–2.0)
Lactic Acid, Venous: 0.8 mmol/L (ref 0.5–2.0)

## 2015-06-18 MED ORDER — LEVOFLOXACIN IN D5W 750 MG/150ML IV SOLN
750.0000 mg | Freq: Once | INTRAVENOUS | Status: AC
  Administered 2015-06-18: 750 mg via INTRAVENOUS
  Filled 2015-06-18: qty 150

## 2015-06-18 MED ORDER — PANTOPRAZOLE SODIUM 40 MG PO TBEC
40.0000 mg | DELAYED_RELEASE_TABLET | Freq: Every day | ORAL | Status: DC
  Administered 2015-06-19 – 2015-06-20 (×2): 40 mg via ORAL
  Filled 2015-06-18 (×3): qty 1

## 2015-06-18 MED ORDER — ACETAMINOPHEN 650 MG RE SUPP: 650.0000 mg | Freq: Four times a day (QID) | RECTAL | Status: DC | PRN

## 2015-06-18 MED ORDER — ASPIRIN EC 81 MG PO TBEC
81.0000 mg | DELAYED_RELEASE_TABLET | Freq: Every day | ORAL | Status: DC
  Administered 2015-06-19 – 2015-06-20 (×2): 81 mg via ORAL
  Filled 2015-06-18 (×2): qty 1

## 2015-06-18 MED ORDER — DARUNAVIR ETHANOLATE 800 MG PO TABS
800.0000 mg | ORAL_TABLET | Freq: Every day | ORAL | Status: DC
  Administered 2015-06-19 – 2015-06-20 (×2): 800 mg via ORAL
  Filled 2015-06-18 (×4): qty 1

## 2015-06-18 MED ORDER — ACETAMINOPHEN 325 MG PO TABS: 650.0000 mg | ORAL_TABLET | Freq: Four times a day (QID) | ORAL | Status: DC | PRN

## 2015-06-18 MED ORDER — WARFARIN - PHYSICIAN DOSING INPATIENT
Freq: Every day | Status: DC
  Administered 2015-06-18: 18:00:00

## 2015-06-18 MED ORDER — ESCITALOPRAM OXALATE 10 MG PO TABS
10.0000 mg | ORAL_TABLET | Freq: Every day | ORAL | Status: DC
  Administered 2015-06-19 – 2015-06-20 (×2): 10 mg via ORAL
  Filled 2015-06-18 (×2): qty 1

## 2015-06-18 MED ORDER — SACCHAROMYCES BOULARDII 250 MG PO CAPS
250.0000 mg | ORAL_CAPSULE | Freq: Two times a day (BID) | ORAL | Status: DC
  Administered 2015-06-18 – 2015-06-20 (×4): 250 mg via ORAL
  Filled 2015-06-18 (×4): qty 1

## 2015-06-18 MED ORDER — IBUPROFEN 400 MG PO TABS
200.0000 mg | ORAL_TABLET | Freq: Four times a day (QID) | ORAL | Status: DC | PRN
  Administered 2015-06-18 – 2015-06-19 (×3): 200 mg via ORAL
  Filled 2015-06-18 (×3): qty 1

## 2015-06-18 MED ORDER — LEVOFLOXACIN IN D5W 750 MG/150ML IV SOLN
750.0000 mg | INTRAVENOUS | Status: DC
  Administered 2015-06-19 – 2015-06-20 (×2): 750 mg via INTRAVENOUS
  Filled 2015-06-18 (×2): qty 150

## 2015-06-18 MED ORDER — VANCOMYCIN HCL 10 G IV SOLR
1250.0000 mg | Freq: Two times a day (BID) | INTRAVENOUS | Status: DC
  Administered 2015-06-19 (×2): 1250 mg via INTRAVENOUS
  Filled 2015-06-18 (×4): qty 1250

## 2015-06-18 MED ORDER — VANCOMYCIN HCL IN DEXTROSE 1-5 GM/200ML-% IV SOLN: 1000.0000 mg | Freq: Once | INTRAVENOUS | Status: DC

## 2015-06-18 MED ORDER — AZATHIOPRINE 50 MG PO TABS
50.0000 mg | ORAL_TABLET | Freq: Two times a day (BID) | ORAL | Status: DC
  Administered 2015-06-18 – 2015-06-19 (×3): 50 mg via ORAL
  Filled 2015-06-18 (×9): qty 1

## 2015-06-18 MED ORDER — VANCOMYCIN HCL 10 G IV SOLR
1750.0000 mg | Freq: Once | INTRAVENOUS | Status: AC
  Administered 2015-06-18: 1750 mg via INTRAVENOUS
  Filled 2015-06-18: qty 1750

## 2015-06-18 MED ORDER — SODIUM CHLORIDE 0.9 % IV BOLUS (SEPSIS)
1000.0000 mL | Freq: Once | INTRAVENOUS | Status: AC
  Administered 2015-06-18: 1000 mL via INTRAVENOUS

## 2015-06-18 MED ORDER — FUROSEMIDE 20 MG PO TABS
20.0000 mg | ORAL_TABLET | Freq: Every evening | ORAL | Status: DC
  Administered 2015-06-18 – 2015-06-19 (×2): 20 mg via ORAL
  Filled 2015-06-18 (×2): qty 1

## 2015-06-18 MED ORDER — ATORVASTATIN CALCIUM 10 MG PO TABS
10.0000 mg | ORAL_TABLET | Freq: Every day | ORAL | Status: DC
  Administered 2015-06-18 – 2015-06-19 (×2): 10 mg via ORAL
  Filled 2015-06-18 (×2): qty 1

## 2015-06-18 MED ORDER — VALACYCLOVIR HCL 500 MG PO TABS
500.0000 mg | ORAL_TABLET | Freq: Every day | ORAL | Status: DC
  Administered 2015-06-19 – 2015-06-20 (×2): 500 mg via ORAL
  Filled 2015-06-18 (×4): qty 1

## 2015-06-18 MED ORDER — WARFARIN SODIUM 5 MG PO TABS
5.0000 mg | ORAL_TABLET | Freq: Once | ORAL | Status: AC
  Administered 2015-06-18: 5 mg via ORAL
  Filled 2015-06-18: qty 1

## 2015-06-18 MED ORDER — ELVITEG-COBIC-EMTRICIT-TENOFAF 150-150-200-10 MG PO TABS
1.0000 | ORAL_TABLET | Freq: Every day | ORAL | Status: DC
  Administered 2015-06-19 – 2015-06-20 (×2): 1 via ORAL
  Filled 2015-06-18 (×4): qty 1

## 2015-06-18 MED ORDER — PREDNISONE 10 MG PO TABS
5.0000 mg | ORAL_TABLET | Freq: Two times a day (BID) | ORAL | Status: DC
  Administered 2015-06-18 – 2015-06-19 (×3): 5 mg via ORAL
  Filled 2015-06-18 (×4): qty 1

## 2015-06-18 MED ORDER — METOPROLOL SUCCINATE ER 25 MG PO TB24
25.0000 mg | ORAL_TABLET | Freq: Every day | ORAL | Status: DC
  Administered 2015-06-18 – 2015-06-19 (×2): 25 mg via ORAL
  Filled 2015-06-18 (×2): qty 1

## 2015-06-18 MED ORDER — PREGABALIN 75 MG PO CAPS
200.0000 mg | ORAL_CAPSULE | Freq: Two times a day (BID) | ORAL | Status: DC
  Administered 2015-06-18 – 2015-06-20 (×4): 200 mg via ORAL
  Filled 2015-06-18 (×4): qty 1

## 2015-06-18 MED ORDER — LORATADINE 10 MG PO TABS
10.0000 mg | ORAL_TABLET | Freq: Every day | ORAL | Status: DC
  Administered 2015-06-18 – 2015-06-20 (×3): 10 mg via ORAL
  Filled 2015-06-18 (×3): qty 1

## 2015-06-18 MED ORDER — ZOLPIDEM TARTRATE 5 MG PO TABS
5.0000 mg | ORAL_TABLET | Freq: Every evening | ORAL | Status: DC | PRN
  Administered 2015-06-18: 5 mg via ORAL
  Filled 2015-06-18: qty 1

## 2015-06-18 NOTE — ED Provider Notes (Signed)
CSN: [id number]     Arrival date & time 06/18/15  1059 History   First MD Initiated Contact with Patient 06/18/15 1238     Chief Complaint  Patient presents with  . Generalized Body Aches     (Consider location/radiation/quality/duration/timing/severity/associated sxs/prior Treatment) HPI Patient with fever of 102 which began 3 nights ago.accompanied by shaking chills. Other associated symptoms include effective cough of yellowish-green sputum with possible blood streaks. He denies any vomiting. He treated himself with ibuprofen approximately 8 AM today no vomiting. He does admit to shortness of breath, which is chronic due to pulmonary fibrosis. No other associated symptoms. Nothing makes symptoms better or worse he does feel improved since taking ibuprofen this morning Past Medical History  Diagnosis Date  . Osteoarthritis, knee     s/p B TKA  . HIV infection ([place]) dx 1993  . TIA (transient ischemic attack) 1997    mild residual L mouth droop  . H/O hiatal hernia 2008    surgery  . Gynecomastia, male   . Impotence of organic origin   . Rheumatoid arthritis(714.0) dx 2010    MTX, follows with rheum  . Gout   . Chronic back pain     follows with Nsurg  . Seasonal allergies   . Diverticulosis   . Gallstones   . Status post dilation of esophageal narrowing   . GERD (gastroesophageal reflux disease)   . Diverticulosis   . Esophagitis   . Hemorrhoids   . Tubular adenoma of colon   . Myocardial infarction ([place]) 2010     x 2  . Hypertension   . DVT, lower extremity, recurrent ([place]) 2008, 2009    LLE, chronic anticoag since 2009  . Carotid artery occlusion     40-60% right ICA stenosis (09/2008)  . Neuromuscular disorder ([facility])     neuropathy  . Fibromyalgia   . Hepatitis A yrs ago  . CLL (chronic lymphoblastic leukemia) dx 2010    Followed at [facility] q65mo, no current therapy   . Secondary syphilis 07/24/14 dx    s/p 2 wks doxy  . Type II diabetes mellitus with manifestations  ([place]) 04/02/2011  . Coronary artery disease 2010    s/p CABG '10, sees Dr. Percival Spanish   Past Surgical History  Procedure Laterality Date  . Spine surgery  2010    "rod and screws", "failed", lopwer spine,   . Cholecystectomy    . Hiatal hernia repair      wrap  . Shoulder surgery Left   . Mandible surgery Bilateral     tmj  . Varicose vein      stripping  . Replacement total knee Bilateral   . Knee arthroplasty  07/22/2011    Procedure: COMPUTER ASSISTED TOTAL KNEE ARTHROPLASTY;  Surgeon: Meredith Pel, MD;  Location: [place];  Service: Orthopedics;  Laterality: Right;  Right total knee arthroplasty  . Coronary artery bypass graft  2010    triple bypass  . Hardware removal N/A 07/02/2012    Procedure: HARDWARE REMOVAL;  Surgeon: Elaina Hoops, MD;  Location: [place] [facility];  Service: Neurosurgery;  Laterality: N/A;  . Inguinal hernia repair Bilateral   . Umbilical hernia repair      x 1  . Rotator cuff repair Right   . Esophagogastroduodenoscopy (egd) with propofol N/A 12/28/2012    Procedure: ESOPHAGOGASTRODUODENOSCOPY (EGD) WITH PROPOFOL;  Surgeon: Jerene Bears, MD;  Location: [facility];  Service: Gastroenterology;  Laterality: N/A;  . Colonoscopy with propofol  N/A 12/28/2012    Procedure: COLONOSCOPY WITH PROPOFOL;  Surgeon: Jerene Bears, MD;  Location: [facility];  Service: Gastroenterology;  Laterality: N/A;  . Joint replacement Left 1999  . Tonsillectomy    . Ring around testicle hernia reapir  184 and 1986    x 2  . Esophagogastroduodenoscopy (egd) with propofol N/A 03/15/2013    Procedure: ESOPHAGOGASTRODUODENOSCOPY (EGD) WITH PROPOFOL;  Surgeon: Jerene Bears, MD;  Location: [facility];  Service: Gastroenterology;  Laterality: N/A;  patient reports last CD4 count 1200 approximately 2 months ago Family History  Problem Relation Age of Onset  . Breast cancer Mother   . Hypertension Mother   . Hyperlipidemia Mother   . Diabetes Mother   . Prostate cancer Father   .  Colon polyps Father   . Hyperlipidemia Father   . Crohn's disease Paternal Aunt   . Diabetes Maternal Grandmother   . Diabetes Brother     x 3  . Heart disease Brother     x 3  . Hyperlipidemia Brother     x 3  . Colon cancer Neg Hx   . Alcohol abuse Daughter   . Drug abuse Daughter   . Asthma Brother    Social History  Substance Use Topics  . Smoking status: Never Smoker   . Smokeless tobacco: Never Used     Comment: occ wine  . Alcohol Use: Yes     Comment: occasional wine- 1-2 per week    Review of Systems  Constitutional: Positive for fever and chills.  HENT: Negative.   Respiratory: Positive for cough and shortness of breath.   Cardiovascular: Negative.   Gastrointestinal: Negative.   Musculoskeletal: Negative.   Skin: Negative.   Allergic/Immunologic: Positive for immunocompromised state.       Chronically on steroids and patient has been on prednisone for several months  Neurological: Negative.   Hematological: Bruises/bleeds easily.  Psychiatric/Behavioral: Negative.   All other systems reviewed and are negative.     Allergies  Morphine; Other; Peanut-containing drug products; Oxycodone-acetaminophen; Penicillins; and Promethazine hcl  Home Medications   Prior to Admission medications   Medication Sig Start Date End Date Taking? Authorizing Provider  aspirin EC 81 MG tablet Take 81 mg by mouth daily.   Yes Historical Provider, MD  atorvastatin (LIPITOR) 10 MG tablet Take 1 tablet (10 mg total) by mouth at bedtime. Patient taking differently: Take 10 mg by mouth every morning.  11/30/14  Yes [name], MD  azaTHIOprine (IMURAN) 50 MG tablet Take 50 mg by mouth 2 (two) times daily. 06/07/15  Yes Historical Provider, MD  EPINEPHrine (EPIPEN 2-PAK) 0.3 mg/0.3 mL IJ SOAJ injection Inject 0.3 mLs (0.3 mg total) into the muscle once. 06/12/14  Yes Rowe Clack, MD  escitalopram (LEXAPRO) 10 MG tablet Take 1 tablet (10 mg total) by mouth daily. 05/26/14  06/18/15 Yes Rowe Clack, MD  furosemide (LASIX) 20 MG tablet Take 1 tablet (20 mg total) by mouth 2 (two) times daily. Patient taking differently: Take 20 mg by mouth every evening.  05/17/15  Yes [name], MD  GENVOYA 150-150-200-10 MG TABS tablet TAKE 1 TABLET BY MOUTH DAILY WITH BREAKFAST. 01/08/15  Yes Thayer Headings, MD  ibuprofen (ADVIL,MOTRIN) 200 MG tablet Take 200 mg by mouth every 6 (six) hours as needed for headache.   Yes Historical Provider, MD  inFLIXimab (REMICADE) 100 MG injection Inject into the vein every 6 (six) weeks.    Yes Historical Provider,  MD  loratadine (CLARITIN) 10 MG tablet Take 1 tablet (10 mg total) by mouth daily. 03/13/15  Yes [name], MD  LYRICA 200 MG capsule TAKE 1 CAPSULE BY MOUTH TWICE DAILY 01/24/15  Yes Campbell Riches, MD  metoprolol succinate (TOPROL-XL) 25 MG 24 hr tablet Take 1 tablet (25 mg total) by mouth at bedtime. 01/05/15  Yes [name], MD  pantoprazole (PROTONIX) 40 MG tablet TAKE 1 TABLET EVERY DAY 06/19/14  Yes [name], MD  predniSONE (DELTASONE) 5 MG tablet Take 5 mg by mouth 2 (two) times daily.   Yes Historical Provider, MD  PREZISTA 800 MG tablet TAKE 1 TABLET (800 MG TOTAL) BY MOUTH DAILY. 01/22/15  Yes Thayer Headings, MD  saccharomyces boulardii (FLORASTOR) 250 MG capsule Take 1 capsule (250 mg total) by mouth 2 (two) times daily. 02/05/15  Yes [name] S Esterwood, PA-C  urea (CARMOL) 10 % cream APPLY TOPICALLY AS NEEDED. Patient taking differently: APPLY TOPICALLY AS NEEDED -APPLIED TO BUTTOCKS 04/09/15  Yes Campbell Riches, MD  valACYclovir (VALTREX) 500 MG tablet TAKE 1 TABLET BY MOUTH EVERY DAY 04/09/15  Yes Thayer Headings, MD  warfarin (COUMADIN) 10 MG tablet TAKE 1/2 ON WEDNESDAY AND 1 TABLET ALL OTHER DAYS Patient taking differently: TAKE 10MG  ON WEDNESDAYS AND SATURDAYS, THEN TAKE 5MG  ON ALL OTHER DAYS 11/16/14  Yes [name], MD  zolpidem (AMBIEN) 5 MG tablet TAKE 1 TABLET BY MOUTH AT BEDTIME AS NEEDED FOR  SLEEP 06/07/15  Yes Biagio Borg, MD  diphenoxylate-atropine (LOMOTIL) 2.5-0.025 MG per tablet Take 1 tablet by mouth 4 (four) times daily as needed for diarrhea or loose stools. Patient not taking: Reported on 06/18/2015 02/05/15   [name] S Esterwood, PA-C  ondansetron (ZOFRAN ODT) 4 MG disintegrating tablet 4mg  ODT q6 hours prn nausea/vomit Patient not taking: Reported on 06/18/2015 07/11/14   Thayer Headings, MD  prednisone BP 152/73 mmHg  Pulse 87  Temp(Src) 98.8 F (37.1 C) (Tympanic)  Resp 16  Ht 5\' 7"  (1.702 m)  Wt 195 lb (88.451 kg)  BMI 30.53 kg/m2  SpO2 93% Physical Exam  Constitutional: He appears well-developed and well-nourished. No distress.  Chronically ill-appearing  HENT:  Head: Normocephalic and atraumatic.  Eyes: Conjunctivae are normal. Pupils are equal, round, and reactive to light.  Neck: Neck supple. No tracheal deviation present. No thyromegaly present.  Cardiovascular: Normal rate and regular rhythm.   No murmur heard. Pulmonary/Chest: Effort normal.  Scant rhonchi diffusely  Abdominal: Soft. Bowel sounds are normal. He exhibits no distension. There is no tenderness.  Musculoskeletal: Normal range of motion. He exhibits no edema or tenderness.  Neurological: He is alert. Coordination normal.  Skin: Skin is warm and dry. No rash noted.  Approximate 20 x 10 cm purplishecchymosisat left calf  Psychiatric: He has a normal mood and affect.  Nursing note and vitals reviewed.   ED Course  Procedures (including critical care time) Labs Review Labs Reviewed  COMPREHENSIVE METABOLIC PANEL - Abnormal; Notable for the following:    BUN 29 (*)    Calcium 8.0 (*)    Total Protein 5.6 (*)    Albumin 3.1 (*)    All other components within normal limits  URINALYSIS, ROUTINE W REFLEX MICROSCOPIC (NOT AT [facility]) - Abnormal; Notable for the following:    Color, Urine AMBER (*)    APPearance HAZY (*)    Ketones, ur TRACE (*)    All other components within normal limits  CBC  WITH DIFFERENTIAL/PLATELET - Abnormal; Notable for the following:    WBC 16.7 (*)    RDW 18.8 (*)    Platelets 106 (*)    Lymphs Abs 12.3 (*)    All other components within normal limits  URINE CULTURE  LACTIC ACID, PLASMA  LACTIC ACID, PLASMA  PATHOLOGIST SMEAR REVIEW    Imaging Review Dg Chest 2 View  06/18/2015  CLINICAL DATA:  Body aches with fever with for 4 days. Cough. Multiple prior surgeries. EXAM: CHEST  2 VIEW COMPARISON:  CT 05/11/2015.  Radiographs 04/26/2015. FINDINGS: The heart size and mediastinal contours are stable status post median sternotomy and CABG. Patchy left-greater-than-right ground-glass opacities are not significantly changed from the recent prior studies. There is no confluent airspace opacity, significant pleural effusion or pneumothorax. Right lung granuloma noted. There are postsurgical changes at the right humeral head and in the lumbar spine. IMPRESSION: Stable bilateral ground-glass opacities as evaluated on CT 5 weeks ago. No new findings identified. Electronically Signed   By: Richardean Sale M.D.   On: 06/18/2015 12:06   I have personally reviewed and evaluated these images and lab results as part of my medical decision-making.   EKG Interpretation None     Results for orders placed or performed during the hospital encounter of 06/18/15  Comprehensive metabolic panel  Result Value Ref Range   Sodium 137 135 - 145 mmol/L   Potassium 4.1 3.5 - 5.1 mmol/L   Chloride 105 101 - 111 mmol/L   CO2 25 22 - 32 mmol/L   Glucose, Bld 96 65 - 99 mg/dL   BUN 29 (H) 6 - 20 mg/dL   Creatinine, Ser 0.95 0.61 - 1.24 mg/dL   Calcium 8.0 (L) 8.9 - 10.3 mg/dL   Total Protein 5.6 (L) 6.5 - 8.1 g/dL   Albumin 3.1 (L) 3.5 - 5.0 g/dL   AST 30 15 - 41 U/L   ALT 25 17 - 63 U/L   Alkaline Phosphatase 64 38 - 126 U/L   Total Bilirubin 0.8 0.3 - 1.2 mg/dL   GFR calc non Af Amer >60 >60 mL/min   GFR calc Af Amer >60 >60 mL/min   Anion gap 7 5 - 15  Urinalysis, Routine  w reflex microscopic (not at [facility])  Result Value Ref Range   Color, Urine AMBER (A) YELLOW   APPearance HAZY (A) CLEAR   Specific Gravity, Urine 1.020 1.005 - 1.030   pH 6.5 5.0 - 8.0   Glucose, UA NEGATIVE NEGATIVE mg/dL   Hgb urine dipstick NEGATIVE NEGATIVE   Bilirubin Urine NEGATIVE NEGATIVE   Ketones, ur TRACE (A) NEGATIVE mg/dL   Protein, ur NEGATIVE NEGATIVE mg/dL   Nitrite NEGATIVE NEGATIVE   Leukocytes, UA NEGATIVE NEGATIVE  Lactic acid, plasma  Result Value Ref Range   Lactic Acid, Venous 1.0 0.5 - 2.0 mmol/L  CBC with Differential  Result Value Ref Range   WBC 16.7 (H) 4.0 - 10.5 K/uL   RBC 4.75 4.22 - 5.81 MIL/uL   Hemoglobin 13.7 13.0 - 17.0 g/dL   HCT 42.4 39.0 - 52.0 %   MCV 89.3 78.0 - 100.0 fL   MCH 28.8 26.0 - 34.0 pg   MCHC 32.3 30.0 - 36.0 g/dL   RDW 18.8 (H) 11.5 - 15.5 %   Platelets 106 (L) 150 - 400 K/uL   Neutrophils Relative % 22 %   Neutro Abs 3.7 1.7 - 7.7 K/uL   Lymphocytes Relative 74 %   Lymphs Abs 12.3 (  H) 0.7 - 4.0 K/uL   Monocytes Relative 4 %   Monocytes Absolute 0.6 0.1 - 1.0 K/uL   Eosinophils Relative 0 %   Eosinophils Absolute 0.1 0.0 - 0.7 K/uL   Basophils Relative 0 %   Basophils Absolute 0.0 0.0 - 0.1 K/uL   WBC Morphology ATYPICAL LYMPHOCYTES   Protime-INR  Result Value Ref Range   Prothrombin Time 27.6 (H) 11.6 - 15.2 seconds   INR 2.62 (H) 0.00 - 1.49   Dg Chest 2 View  06/18/2015  CLINICAL DATA:  Body aches with fever with for 4 days. Cough. Multiple prior surgeries. EXAM: CHEST  2 VIEW COMPARISON:  CT 05/11/2015.  Radiographs 04/26/2015. FINDINGS: The heart size and mediastinal contours are stable status post median sternotomy and CABG. Patchy left-greater-than-right ground-glass opacities are not significantly changed from the recent prior studies. There is no confluent airspace opacity, significant pleural effusion or pneumothorax. Right lung granuloma noted. There are postsurgical changes at the right humeral head and in the  lumbar spine. IMPRESSION: Stable bilateral ground-glass opacities as evaluated on CT 5 weeks ago. No new findings identified. Electronically Signed   By: Richardean Sale M.D.   On: 06/18/2015 12:06   Chest xray viewed by me MDM  Code sepsis called based on Sirs criteria of fever, leukocytosis source of infection thought to be respiratory. Will treat patient for community-acquired pneumonia based on symptomatology. Patient may have influenza-like illness. Final diagnoses:  None  case discussed with Dr. Ronnald Ramp,  pt'sPMD who recommends inpatient stay in light of immune compromised state. Dr.Chiu consulted. Plan 23 hour observation, MedSurg floor, intravenous antibiotics Dx  Community acquired pneumonia    Orlie Dakin, MD 06/18/15 1442

## 2015-06-18 NOTE — H&P (Signed)
History and Physical  [name] [id number] DOB: 02/28/1951 DOA: 06/18/2015  Referring physician: ED physician PCP: [name], MD   Chief Complaint: dark spots in sputum and respiratory distress  HPI:  [name] is a very pleasant man with a PMH significant for HIV, RA, recurrent DVT (on long term warfarin therapy), CLL (in remission), HTN, Type II diabetes mellitus who presents to the ED for dark spots in sputum this morning and increased work of breathing and respiratory distress.  Patient states he is taking deep breaths but feels a burning sensation across his chest and doesn't feel like he is moving any significant amount of air into or out of his chest.  He mentions he has not been around any sick contacts, never misses his HIV medication, but recently was started on steroids for RA (a short course his azathioprine takes effect) and did recently tear down moldy dry wall 1.5-2 weeks ago.  Patient is not sure if the mold is what started his respiratory issues.  Patient reports 2-3 days of feeling very lethargic, drained of energy and having a fever.  Fevers ran up to 102-103 but did come down with ibuprofen.  In the emergency department patient was seen and evaluated and given Levaquin and Vancomycin to cover for possible pneumonia. Pertinent labs: below EKG: not performed Imaging: independently reviewed.  Review of Systems:  As noted in HPI Negative for visual changes, sore throat, rash, new muscle aches, chest pain, dysuria, bleeding, n/v/abdominal pain.  Past Medical History  Diagnosis Date  . Osteoarthritis, knee     s/p B TKA  . HIV infection ([place]) dx 1993  . TIA (transient ischemic attack) 1997    mild residual L mouth droop  . H/O hiatal hernia 2008    surgery  . Gynecomastia, male   . Impotence of organic origin   . Rheumatoid arthritis(714.0) dx 2010    MTX, follows with rheum  . Gout   . Chronic back pain     follows with Nsurg  . Seasonal  allergies   . Diverticulosis   . Gallstones   . Status post dilation of esophageal narrowing   . GERD (gastroesophageal reflux disease)   . Diverticulosis   . Esophagitis   . Hemorrhoids   . Tubular adenoma of colon   . Myocardial infarction ([place]) 2010     x 2  . Hypertension   . DVT, lower extremity, recurrent ([place]) 2008, 2009    LLE, chronic anticoag since 2009  . Carotid artery occlusion     40-60% right ICA stenosis (09/2008)  . Neuromuscular disorder (HCC)     neuropathy  . Fibromyalgia   . Hepatitis A yrs ago  . CLL (chronic lymphoblastic leukemia) dx 2010    Followed at [facility] q75mo, no current therapy   . Secondary syphilis 07/24/14 dx    s/p 2 wks doxy  . Type II diabetes mellitus with manifestations ([place]) 04/02/2011  . Coronary artery disease 2010    s/p CABG '10, sees Dr. Percival Spanish    Past Surgical History  Procedure Laterality Date  . Spine surgery  2010    "rod and screws", "failed", lopwer spine,   . Cholecystectomy    . Hiatal hernia repair      wrap  . Shoulder surgery Left   . Mandible surgery Bilateral     tmj  . Varicose vein      stripping  . Replacement total knee Bilateral   . Knee  arthroplasty  07/22/2011    Procedure: COMPUTER ASSISTED TOTAL KNEE ARTHROPLASTY;  Surgeon: Meredith Pel, MD;  Location: [facility];  Service: Orthopedics;  Laterality: Right;  Right total knee arthroplasty  . Coronary artery bypass graft  2010    triple bypass  . Hardware removal N/A 07/02/2012    Procedure: HARDWARE REMOVAL;  Surgeon: Elaina Hoops, MD;  Location: [facility];  Service: Neurosurgery;  Laterality: N/A;  . Inguinal hernia repair Bilateral   . Umbilical hernia repair      x 1  . Rotator cuff repair Right   . Esophagogastroduodenoscopy (egd) with propofol N/A 12/28/2012    Procedure: ESOPHAGOGASTRODUODENOSCOPY (EGD) WITH PROPOFOL;  Surgeon: Jerene Bears, MD;  Location: [facility];  Service: Gastroenterology;  Laterality: N/A;  . Colonoscopy with propofol  N/A 12/28/2012    Procedure: COLONOSCOPY WITH PROPOFOL;  Surgeon: Jerene Bears, MD;  Location: [facility];  Service: Gastroenterology;  Laterality: N/A;  . Joint replacement Left 1999  . Tonsillectomy    . Ring around testicle hernia reapir  184 and 1986    x 2  . Esophagogastroduodenoscopy (egd) with propofol N/A 03/15/2013    Procedure: ESOPHAGOGASTRODUODENOSCOPY (EGD) WITH PROPOFOL;  Surgeon: Jerene Bears, MD;  Location: [facility];  Service: Gastroenterology;  Laterality: N/A;    Social History:  reports that he has never smoked. He has never used smokeless tobacco. He reports that he drinks alcohol. He reports that he does not use illicit drugs. lives alone Self-care  Allergies  Allergen Reactions  . Morphine Other (See Comments)    REACTION: severe headache  . Other Anaphylaxis and Hives    Pecan  . Peanut-Containing Drug Products Anaphylaxis and Hives    Swelling of throat  . Oxycodone-Acetaminophen Other (See Comments)    REACTION: headache  . Penicillins Rash    Has patient had a PCN reaction causing immediate rash, facial/tongue/throat swelling, SOB or lightheadedness with hypotension: Yes Has patient had a PCN reaction causing severe rash involving mucus membranes or skin necrosis: No Has patient had a PCN reaction that required hospitalization No Has patient had a PCN reaction occurring within the last 10 years: No If all of the above answers are "NO", then may proceed with Cephalosporin use.   REACTION: red, flushed  . Promethazine Hcl Other (See Comments)    REACTION: makes him feel drunk    Family History  Problem Relation Age of Onset  . Breast cancer Mother   . Hypertension Mother   . Hyperlipidemia Mother   . Diabetes Mother   . Prostate cancer Father   . Colon polyps Father   . Hyperlipidemia Father   . Crohn's disease Paternal Aunt   . Diabetes Maternal Grandmother   . Diabetes Brother     x 3  . Heart disease Brother     x 3  . Hyperlipidemia  Brother     x 3  . Colon cancer Neg Hx   . Alcohol abuse Daughter   . Drug abuse Daughter   . Asthma Brother      Prior to Admission medications   Medication Sig Start Date End Date Taking? Authorizing Provider  aspirin EC 81 MG tablet Take 81 mg by mouth daily.   Yes Historical Provider, MD  atorvastatin (LIPITOR) 10 MG tablet Take 1 tablet (10 mg total) by mouth at bedtime. Patient taking differently: Take 10 mg by mouth every morning.  11/30/14  Yes [name], MD  azaTHIOprine [facility]) 50  MG tablet Take 50 mg by mouth 2 (two) times daily. 06/07/15  Yes Historical Provider, MD  EPINEPHrine (EPIPEN 2-PAK) 0.3 mg/0.3 mL IJ SOAJ injection Inject 0.3 mLs (0.3 mg total) into the muscle once. 06/12/14  Yes Rowe Clack, MD  escitalopram (LEXAPRO) 10 MG tablet Take 1 tablet (10 mg total) by mouth daily. 05/26/14 06/18/15 Yes Rowe Clack, MD  furosemide (LASIX) 20 MG tablet Take 1 tablet (20 mg total) by mouth 2 (two) times daily. Patient taking differently: Take 20 mg by mouth every evening.  05/17/15  Yes [name], MD  GENVOYA 150-150-200-10 MG TABS tablet TAKE 1 TABLET BY MOUTH DAILY WITH BREAKFAST. 01/08/15  Yes Thayer Headings, MD  ibuprofen (ADVIL,MOTRIN) 200 MG tablet Take 200 mg by mouth every 6 (six) hours as needed for headache.   Yes Historical Provider, MD  inFLIXimab (REMICADE) 100 MG injection Inject into the vein every 6 (six) weeks.    Yes Historical Provider, MD  loratadine (CLARITIN) 10 MG tablet Take 1 tablet (10 mg total) by mouth daily. 03/13/15  Yes [name], MD  LYRICA 200 MG capsule TAKE 1 CAPSULE BY MOUTH TWICE DAILY 01/24/15  Yes Campbell Riches, MD  metoprolol succinate (TOPROL-XL) 25 MG 24 hr tablet Take 1 tablet (25 mg total) by mouth at bedtime. 01/05/15  Yes [name], MD  pantoprazole (PROTONIX) 40 MG tablet TAKE 1 TABLET EVERY DAY 06/19/14  Yes [name], MD  predniSONE (DELTASONE) 5 MG tablet Take 5 mg by mouth 2 (two) times daily.   Yes  Historical Provider, MD  PREZISTA 800 MG tablet TAKE 1 TABLET (800 MG TOTAL) BY MOUTH DAILY. 01/22/15  Yes Thayer Headings, MD  saccharomyces boulardii (FLORASTOR) 250 MG capsule Take 1 capsule (250 mg total) by mouth 2 (two) times daily. 02/05/15  Yes [name] S Esterwood, PA-C  urea (CARMOL) 10 % cream APPLY TOPICALLY AS NEEDED. Patient taking differently: APPLY TOPICALLY AS NEEDED -APPLIED TO BUTTOCKS 04/09/15  Yes Campbell Riches, MD  valACYclovir (VALTREX) 500 MG tablet TAKE 1 TABLET BY MOUTH EVERY DAY 04/09/15  Yes Thayer Headings, MD  warfarin (COUMADIN) 10 MG tablet TAKE 1/2 ON WEDNESDAY AND 1 TABLET ALL OTHER DAYS Patient taking differently: TAKE 10MG  ON WEDNESDAYS AND SATURDAYS, THEN TAKE 5MG  ON ALL OTHER DAYS 11/16/14  Yes [name], MD  zolpidem (AMBIEN) 5 MG tablet TAKE 1 TABLET BY MOUTH AT BEDTIME AS NEEDED FOR SLEEP 06/07/15  Yes Biagio Borg, MD  diphenoxylate-atropine (LOMOTIL) 2.5-0.025 MG per tablet Take 1 tablet by mouth 4 (four) times daily as needed for diarrhea or loose stools. Patient not taking: Reported on 06/18/2015 02/05/15   [name] S Esterwood, PA-C  ondansetron (ZOFRAN ODT) 4 MG disintegrating tablet 4mg  ODT q6 hours prn nausea/vomit Patient not taking: Reported on 06/18/2015 07/11/14   Thayer Headings, MD   Physical Exam: Filed Vitals:   06/18/15 1300 06/18/15 1330 06/18/15 1400 06/18/15 1427  BP: 106/72 126/78 123/80   Pulse: 77 79 78   Temp:    98.2 F (36.8 C)  TempSrc:    Oral  Resp: 13 19 17    Height:      Weight:      SpO2: 92% 95% 94%      General:  Appears calm and comfortable Eyes: PERRL, normal lids, irises & conjunctiva ENT: grossly normal hearing, lips & tongue Neck: no LAD, masses or thyromegaly Cardiovascular: RRR, no m/r/g. No LE edema. Telemetry: SR,  no arrhythmias  Respiratory: poor aeration, few wheezes basilar lung fields bilaterally Abdomen: soft, ntnd Skin: no rash or induration seen on limited exam; ecchymosis of left lower  leg Musculoskeletal: grossly normal tone BUE/BLE, ulnar deviation appreciated of fingers bilaterally Psychiatric: grossly normal mood and affect, speech fluent and appropriate Neurologic: grossly non-focal.  Wt Readings from Last 3 Encounters:  06/18/15 88.451 kg (195 lb)  05/31/15 88.905 kg (196 lb)  05/25/15 87.573 kg (193 lb 1 oz)    Labs on Admission:  Basic Metabolic Panel:  Recent Labs Lab 06/18/15 1125  NA 137  K 4.1  CL 105  CO2 25  GLUCOSE 96  BUN 29*  CREATININE 0.95  CALCIUM 8.0*    Liver Function Tests:  Recent Labs Lab 06/18/15 1125  AST 30  ALT 25  ALKPHOS 64  BILITOT 0.8  PROT 5.6*  ALBUMIN 3.1*   No results for input(s): LIPASE, AMYLASE in the last 168 hours. No results for input(s): AMMONIA in the last 168 hours.  CBC:  Recent Labs Lab 06/18/15 1125  WBC 16.7*  NEUTROABS 3.7  HGB 13.7  HCT 42.4  MCV 89.3  PLT 106*    Cardiac Enzymes: No results for input(s): CKTOTAL, CKMB, CKMBINDEX, TROPONINI in the last 168 hours.  Troponin (Point of Care Test) No results for input(s): TROPIPOC in the last 72 hours.  BNP (last 3 results)  Recent Labs  04/19/15 1717 04/26/15 1653  PROBNP 189.0* 182.0*    CBG: No results for input(s): GLUCAP in the last 168 hours.   Radiological Exams on Admission: Dg Chest 2 View  06/18/2015  CLINICAL DATA:  Body aches with fever with for 4 days. Cough. Multiple prior surgeries. EXAM: CHEST  2 VIEW COMPARISON:  CT 05/11/2015.  Radiographs 04/26/2015. FINDINGS: The heart size and mediastinal contours are stable status post median sternotomy and CABG. Patchy left-greater-than-right ground-glass opacities are not significantly changed from the recent prior studies. There is no confluent airspace opacity, significant pleural effusion or pneumothorax. Right lung granuloma noted. There are postsurgical changes at the right humeral head and in the lumbar spine. IMPRESSION: Stable bilateral ground-glass opacities as  evaluated on CT 5 weeks ago. No new findings identified. Electronically Signed   By: Richardean Sale M.D.   On: 06/18/2015 12:06      Active Problems:   CAD (coronary artery disease) of artery bypass graft   Type II diabetes mellitus with manifestations (HCC)   HIV disease ([place])   Chronic lymphoblastic leukemia ([place])   Hypertension   DVT, lower extremity, recurrent ([place])   Community acquired pneumonia   Assessment/Plan 1. Respiratory distress: CXR does not show pneumonia but radiologic findings sometimes lag.  Poor aeration on physical exam.  Continue to monitor vital signs and will discontinue levaquin and vancomycin; Will order influenza PCR; 2. RA- continue patients steroids and azathioprine 3. HIV- continuing patients home medications 4. HTN: continue patients home medications of metoprolol 5. Recurrent DVT: will continue home dose of warfarin, INR 2.62 at admission    Code Status: full  DVT prophylaxis: patient on Coumadin Family Communication: no family at bedside Disposition Plan/Anticipated LOS: discharge tomorrow if patient respiratory status improves  Time spent: 35inutes  Coralie Common, MD Resident Physician  [facility]  06/18/2015, 3:21 PM

## 2015-06-18 NOTE — ED Notes (Signed)
Pt reports body aches and fever since Thursday, reports fever as high 102. Took Ibuprofen this morning at 0730.

## 2015-06-18 NOTE — Progress Notes (Signed)
Pharmacy Antibiotic Note  [name] is a 65 y.o. male admitted on 06/18/2015 with pneumonia.  Pharmacy has been consulted for vancomycin and levaquin dosing.  Plan:Vancomycin 1750 mg IV X 1 then 1250 mg IV q12 hours Levaquin 750 mg IV q24 hours F/u renal function, cultures and clinical course  Height: 5\' 7"  (170.2 cm) Weight: 195 lb (88.451 kg) IBW/kg (Calculated) : 66.1  Temp (24hrs), Avg:98.8 F (37.1 C), Min:98.8 F (37.1 C), Max:98.8 F (37.1 C)   Recent Labs Lab 06/18/15 1125  WBC 16.7*  CREATININE 0.95  LATICACIDVEN 1.0    Estimated Creatinine Clearance: 80.2 mL/min (by C-G formula based on Cr of 0.95).    Allergies  Allergen Reactions  . Morphine Other (See Comments)    REACTION: severe headache  . Other Anaphylaxis and Hives    Pecan  . Peanut-Containing Drug Products Anaphylaxis and Hives    Swelling of throat  . Oxycodone-Acetaminophen Other (See Comments)    REACTION: headache  . Penicillins Rash    Has patient had a PCN reaction causing immediate rash, facial/tongue/throat swelling, SOB or lightheadedness with hypotension: Yes Has patient had a PCN reaction causing severe rash involving mucus membranes or skin necrosis: No Has patient had a PCN reaction that required hospitalization No Has patient had a PCN reaction occurring within the last 10 years: No If all of the above answers are "NO", then may proceed with Cephalosporin use.   REACTION: red, flushed  . Promethazine Hcl Other (See Comments)    REACTION: makes him feel drunk    Antimicrobials this admission: Vanc >> 2/6 Levaquin  >> 2/6  Thank you for allowing pharmacy to be a part of this patient's care.  Excell Seltzer Poteet 06/18/2015 1:55 PM

## 2015-06-19 ENCOUNTER — Observation Stay (HOSPITAL_COMMUNITY): Payer: Medicare Other

## 2015-06-19 DIAGNOSIS — Z7901 Long term (current) use of anticoagulants: Secondary | ICD-10-CM | POA: Diagnosis not present

## 2015-06-19 DIAGNOSIS — I10 Essential (primary) hypertension: Secondary | ICD-10-CM | POA: Diagnosis present

## 2015-06-19 DIAGNOSIS — B20 Human immunodeficiency virus [HIV] disease: Secondary | ICD-10-CM

## 2015-06-19 DIAGNOSIS — C91Z0 Other lymphoid leukemia not having achieved remission: Secondary | ICD-10-CM | POA: Diagnosis not present

## 2015-06-19 DIAGNOSIS — K210 Gastro-esophageal reflux disease with esophagitis: Secondary | ICD-10-CM | POA: Diagnosis present

## 2015-06-19 DIAGNOSIS — M797 Fibromyalgia: Secondary | ICD-10-CM | POA: Diagnosis present

## 2015-06-19 DIAGNOSIS — Z7982 Long term (current) use of aspirin: Secondary | ICD-10-CM | POA: Diagnosis not present

## 2015-06-19 DIAGNOSIS — E1142 Type 2 diabetes mellitus with diabetic polyneuropathy: Secondary | ICD-10-CM | POA: Diagnosis present

## 2015-06-19 DIAGNOSIS — C9111 Chronic lymphocytic leukemia of B-cell type in remission: Secondary | ICD-10-CM | POA: Diagnosis present

## 2015-06-19 DIAGNOSIS — Z8371 Family history of colonic polyps: Secondary | ICD-10-CM | POA: Diagnosis not present

## 2015-06-19 DIAGNOSIS — Z8042 Family history of malignant neoplasm of prostate: Secondary | ICD-10-CM | POA: Diagnosis not present

## 2015-06-19 DIAGNOSIS — Z825 Family history of asthma and other chronic lower respiratory diseases: Secondary | ICD-10-CM | POA: Diagnosis not present

## 2015-06-19 DIAGNOSIS — R0600 Dyspnea, unspecified: Secondary | ICD-10-CM | POA: Insufficient documentation

## 2015-06-19 DIAGNOSIS — G8929 Other chronic pain: Secondary | ICD-10-CM | POA: Diagnosis present

## 2015-06-19 DIAGNOSIS — Z833 Family history of diabetes mellitus: Secondary | ICD-10-CM | POA: Diagnosis not present

## 2015-06-19 DIAGNOSIS — I252 Old myocardial infarction: Secondary | ICD-10-CM | POA: Diagnosis not present

## 2015-06-19 DIAGNOSIS — Z8673 Personal history of transient ischemic attack (TIA), and cerebral infarction without residual deficits: Secondary | ICD-10-CM | POA: Diagnosis not present

## 2015-06-19 DIAGNOSIS — Z96653 Presence of artificial knee joint, bilateral: Secondary | ICD-10-CM | POA: Diagnosis present

## 2015-06-19 DIAGNOSIS — M069 Rheumatoid arthritis, unspecified: Secondary | ICD-10-CM | POA: Diagnosis present

## 2015-06-19 DIAGNOSIS — Z7952 Long term (current) use of systemic steroids: Secondary | ICD-10-CM | POA: Diagnosis not present

## 2015-06-19 DIAGNOSIS — M179 Osteoarthritis of knee, unspecified: Secondary | ICD-10-CM | POA: Diagnosis present

## 2015-06-19 DIAGNOSIS — R918 Other nonspecific abnormal finding of lung field: Secondary | ICD-10-CM | POA: Diagnosis not present

## 2015-06-19 DIAGNOSIS — I2510 Atherosclerotic heart disease of native coronary artery without angina pectoris: Secondary | ICD-10-CM | POA: Diagnosis present

## 2015-06-19 DIAGNOSIS — I82409 Acute embolism and thrombosis of unspecified deep veins of unspecified lower extremity: Secondary | ICD-10-CM | POA: Diagnosis not present

## 2015-06-19 DIAGNOSIS — I82509 Chronic embolism and thrombosis of unspecified deep veins of unspecified lower extremity: Secondary | ICD-10-CM | POA: Diagnosis present

## 2015-06-19 DIAGNOSIS — I739 Peripheral vascular disease, unspecified: Secondary | ICD-10-CM | POA: Diagnosis present

## 2015-06-19 DIAGNOSIS — Z8249 Family history of ischemic heart disease and other diseases of the circulatory system: Secondary | ICD-10-CM | POA: Diagnosis not present

## 2015-06-19 DIAGNOSIS — C9100 Acute lymphoblastic leukemia not having achieved remission: Secondary | ICD-10-CM | POA: Diagnosis present

## 2015-06-19 DIAGNOSIS — J189 Pneumonia, unspecified organism: Secondary | ICD-10-CM | POA: Diagnosis not present

## 2015-06-19 LAB — URINE CULTURE: Culture: 6000

## 2015-06-19 LAB — PROTIME-INR
INR: 3 — ABNORMAL HIGH (ref 0.00–1.49)
Prothrombin Time: 30.6 seconds — ABNORMAL HIGH (ref 11.6–15.2)

## 2015-06-19 LAB — CBC WITH DIFFERENTIAL/PLATELET
Basophils Absolute: 0 10*3/uL (ref 0.0–0.1)
Basophils Relative: 0 %
Eosinophils Absolute: 0 10*3/uL (ref 0.0–0.7)
Eosinophils Relative: 0 %
HCT: 40.1 % (ref 39.0–52.0)
Hemoglobin: 13.1 g/dL (ref 13.0–17.0)
Lymphocytes Relative: 81 %
Lymphs Abs: 11.4 10*3/uL — ABNORMAL HIGH (ref 0.7–4.0)
MCH: 29.1 pg (ref 26.0–34.0)
MCHC: 32.7 g/dL (ref 30.0–36.0)
MCV: 89.1 fL (ref 78.0–100.0)
Monocytes Absolute: 0.6 10*3/uL (ref 0.1–1.0)
Monocytes Relative: 4 %
Neutro Abs: 2 10*3/uL (ref 1.7–7.7)
Neutrophils Relative %: 14 %
Platelets: 119 10*3/uL — ABNORMAL LOW (ref 150–400)
RBC: 4.5 MIL/uL (ref 4.22–5.81)
RDW: 19 % — ABNORMAL HIGH (ref 11.5–15.5)
WBC: 14 10*3/uL — ABNORMAL HIGH (ref 4.0–10.5)

## 2015-06-19 LAB — PATHOLOGIST SMEAR REVIEW

## 2015-06-19 MED ORDER — IOHEXOL 300 MG/ML  SOLN
75.0000 mL | Freq: Once | INTRAMUSCULAR | Status: AC | PRN
  Administered 2015-06-19: 75 mL via INTRAVENOUS

## 2015-06-19 MED ORDER — WARFARIN - PHARMACIST DOSING INPATIENT: Status: DC

## 2015-06-19 NOTE — Care Management Obs Status (Signed)
[place] NOTIFICATION   Patient Details  Name: [name] MRN: [id number] Date of Birth: 08-05-1950   Medicare Observation Status Notification Given:  Yes    Sherald Barge, RN 06/19/2015, 12:39 PM

## 2015-06-19 NOTE — Care Management Note (Signed)
Case Management Note  Patient Details  Name: [name] MRN: [id number] Date of Birth: 1950-06-29  Subjective/Objective:                  Pt is from home and is ind with ADL's. Pt uses cane for ambulation. Pt plans to return home with self care in next 24 hrs.   Action/Plan: No CM needs.   Expected Discharge Date:        06/20/2015          Expected Discharge Plan:  Home/Self Care  In-House Referral:  NA  Discharge planning Services  CM Consult  Post Acute Care Choice:  NA Choice offered to:  NA  DME Arranged:    DME Agency:     HH Arranged:    HH Agency:     Status of Service:  Completed, signed off  Medicare Important Message Given:    Date Medicare IM Given:    Medicare IM give by:    Date Additional Medicare IM Given:    Additional Medicare Important Message give by:     If discussed at [place] of Stay Meetings, dates discussed:    Additional Comments:  Sherald Barge, RN 06/19/2015, 12:40 PM

## 2015-06-19 NOTE — Progress Notes (Signed)
ANTICOAGULATION CONSULT NOTE - Initial Consult  Pharmacy Consult for Coumadin Indication: VTE Treatment (h/o DVT)  Allergies  Allergen Reactions  . Morphine Other (See Comments)    REACTION: severe headache  . Other Anaphylaxis and Hives    Pecan  . Peanut-Containing Drug Products Anaphylaxis and Hives    Swelling of throat  . Oxycodone-Acetaminophen Other (See Comments)    REACTION: headache  . Penicillins Rash    Has patient had a PCN reaction causing immediate rash, facial/tongue/throat swelling, SOB or lightheadedness with hypotension: Yes Has patient had a PCN reaction causing severe rash involving mucus membranes or skin necrosis: No Has patient had a PCN reaction that required hospitalization No Has patient had a PCN reaction occurring within the last 10 years: No If all of the above answers are "NO", then may proceed with Cephalosporin use.   REACTION: red, flushed  . Promethazine Hcl Other (See Comments)    REACTION: makes him feel drunk    Patient Measurements: Height: 5\' 7"  (170.2 cm) Weight: 190 lb 14.4 oz (86.592 kg) IBW/kg (Calculated) : 66.1 Heparin Dosing Weight:   Vital Signs: Temp: 98.1 F (36.7 C) (02/07 1300) Temp Source: Oral (02/07 1300) BP: 115/66 mmHg (02/07 1300) Pulse Rate: 85 (02/07 1300)  Labs:  Recent Labs  06/18/15 1125 06/18/15 1128 06/19/15 0531 06/19/15 1848  HGB 13.7  --  13.1  --   HCT 42.4  --  40.1  --   PLT 106*  --  119*  --   LABPROT  --  27.6*  --  30.6*  INR  --  2.62*  --  3.00*  CREATININE 0.95  --   --   --     Estimated Creatinine Clearance: 79.3 mL/min (by C-G formula based on Cr of 0.95).   Medical History: Past Medical History  Diagnosis Date  . Osteoarthritis, knee     s/p B TKA  . HIV infection ([place]) dx 1993  . TIA (transient ischemic attack) 1997    mild residual L mouth droop  . H/O hiatal hernia 2008    surgery  . Gynecomastia, male   . Impotence of organic origin   . Rheumatoid  arthritis(714.0) dx 2010    MTX, follows with rheum  . Gout   . Chronic back pain     follows with Nsurg  . Seasonal allergies   . Diverticulosis   . Gallstones   . Status post dilation of esophageal narrowing   . GERD (gastroesophageal reflux disease)   . Diverticulosis   . Esophagitis   . Hemorrhoids   . Tubular adenoma of colon   . Myocardial infarction ([place]) 2010     x 2  . Hypertension   . DVT, lower extremity, recurrent ([place]) 2008, 2009    LLE, chronic anticoag since 2009  . Carotid artery occlusion     40-60% right ICA stenosis (09/2008)  . Neuromuscular disorder (HCC)     neuropathy  . Fibromyalgia   . Hepatitis A yrs ago  . CLL (chronic lymphoblastic leukemia) dx 2010    Followed at [facility] q67mo, no current therapy   . Secondary syphilis 07/24/14 dx    s/p 2 wks doxy  . Type II diabetes mellitus with manifestations ([place]) 04/02/2011  . Coronary artery disease 2010    s/p CABG '10, sees Dr. Percival Spanish     Assessment: Continuation of Coumadin PTA for recurrent DVT  Anti-coag visit 06/01/2015 INR 4.8 and dose reduced to Coumadin 5 mg daily  after holding for 2 days INR 2.62 on admission, INR 3.0 today after 5 mg dose   Goal of Therapy:  INR 2-3 Monitor platelets by anticoagulation protocol    Plan:  Hold Coumadin tonight due to INR 3.0 INR/PT daily Monitor for signs of bleeding  [name], [name] 06/19/2015,7:56 PM

## 2015-06-19 NOTE — Progress Notes (Signed)
[facility] PROGRESS NOTE  [name] [id number] DOB: February 02, 1951 DOA: 06/18/2015 PCP: [name], MD  Assessment/Plan: 1. Pneumonia- will continue levaquin. Pulmonology consulted as there is a question of whether patient would benefit from increasing his steroid dose for possible inflammation in lungs.  He was doing a steroid taper outpatient when Azathioprine was started.   2. RA- continue patients steroids and azathioprine 3. HIV- continuing patients home medications 4. HTN: continue patients home medications of metoprolol 5. Recurrent DVT: pharmacy dosing warfarin, INR 2.62 at admission  Code Status: full Family Communication: no family bedside (indicate person spoken with, relationship, and if by phone, the number) Disposition Plan: discharge home in 1-2 days pending improvement in respiratory symptoms   Consultants:  none  Procedures:  none  Antibiotics:  Vancomycin >>06/18/15; discontinued 06/19/15  Levaquin>> 06/18/15  HPI/Subjective: Mr. [name] is a very pleasant man with a PMH significant for HIV, RA, recurrent DVT (on long term warfarin therapy), CLL (in remission), HTN, Type II diabetes mellitus who presents to the ED for dark spots in sputum this morning and increased work of breathing and respiratory distress. Patient states he is taking deep breaths but feels a burning sensation across his chest and doesn't feel like he is moving any significant amount of air into or out of his chest. He mentions he has not been around any sick contacts, never misses his HIV medication, but recently was started on steroids for RA (a short course his azathioprine takes effect) and did recently tear down moldy dry wall 1.5-2 weeks ago. Patient is not sure if the mold is what started his respiratory issues. Patient reports 2-3 days of feeling very lethargic, drained of energy and having a fever. Fevers ran up to 102-103 but did come down with ibuprofen. Patient feels  better today but still is experiencing some burning with inhalation.  CT Chest ordered to evaluate.  CT shows progression of infection/ inflammation.  Patient still feels somewhat lethargic.   Objective: Filed Vitals:   06/18/15 2052 06/19/15 0528  BP: 153/86 137/84  Pulse: 74 79  Temp: 98.8 F (37.1 C) 98.3 F (36.8 C)  Resp: 18 19    Intake/Output Summary (Last 24 hours) at 06/19/15 1333 Last data filed at 06/19/15 0800  Gross per 24 hour  Intake    240 ml  Output   2050 ml  Net  -1810 ml   Filed Weights   06/18/15 1109 06/18/15 1700  Weight: 88.451 kg (195 lb) 86.592 kg (190 lb 14.4 oz)    Exam:   General:  No acute distress, patient sitting in bed resting  Cardiovascular: S1S2, RRR, no murmurs or gallops  Respiratory: rhonchi appreciated in mid to lower lung fields left posterior lung, rhonchi middle to lower lung right side, better air movement today  Abdomen: soft, nontender, nondistended, bowel sounds throughout  Musculoskeletal: FROM, ulnar deviation of fingers bilaterally   Data Reviewed: Basic Metabolic Panel:  Recent Labs Lab 06/18/15 1125  NA 137  K 4.1  CL 105  CO2 25  GLUCOSE 96  BUN 29*  CREATININE 0.95  CALCIUM 8.0*   Liver Function Tests:  Recent Labs Lab 06/18/15 1125  AST 30  ALT 25  ALKPHOS 64  BILITOT 0.8  PROT 5.6*  ALBUMIN 3.1*   No results for input(s): LIPASE, AMYLASE in the last 168 hours. No results for input(s): AMMONIA in the last 168 hours. CBC:  Recent Labs Lab 06/18/15 1125 06/19/15 0531  WBC 16.7* 14.0*  NEUTROABS 3.7 2.0  HGB 13.7 13.1  HCT 42.4 40.1  MCV 89.3 89.1  PLT 106* 119*   Cardiac Enzymes: No results for input(s): CKTOTAL, CKMB, CKMBINDEX, TROPONINI in the last 168 hours. BNP (last 3 results)  Recent Labs  04/20/15 1153  BNP 164.7*    ProBNP (last 3 results)  Recent Labs  04/19/15 1717 04/26/15 1653  PROBNP 189.0* 182.0*    CBG: No results for input(s): GLUCAP in the last 168  hours.  Recent Results (from the past 240 hour(s))  Urine culture     Status: None   Collection Time: 06/18/15 11:51 AM  Result Value Ref Range Status   Specimen Description URINE, CLEAN CATCH  Final   Special Requests NONE  Final   Culture   Final    6,000 COLONIES/mL INSIGNIFICANT GROWTH Performed at [facility]    Report Status 06/19/2015 FINAL  Final  Blood culture (routine x 2)     Status: None (Preliminary result)   Collection Time: 06/18/15  2:09 PM  Result Value Ref Range Status   Specimen Description BLOOD LEFT ANTECUBITAL  Final   Special Requests   Final    BOTTLES DRAWN AEROBIC AND ANAEROBIC AEB=12CC ANA=10CC   Culture NO GROWTH < 24 HOURS  Final   Report Status PENDING  Incomplete  Blood culture (routine x 2)     Status: None (Preliminary result)   Collection Time: 06/18/15  2:16 PM  Result Value Ref Range Status   Specimen Description BLOOD RIGHT ANTECUBITAL  Final   Special Requests BOTTLES DRAWN AEROBIC AND ANAEROBIC 8CC EACH  Final   Culture NO GROWTH < 24 HOURS  Final   Report Status PENDING  Incomplete     Studies: Dg Chest 2 View  06/18/2015  CLINICAL DATA:  Body aches with fever with for 4 days. Cough. Multiple prior surgeries. EXAM: CHEST  2 VIEW COMPARISON:  CT 05/11/2015.  Radiographs 04/26/2015. FINDINGS: The heart size and mediastinal contours are stable status post median sternotomy and CABG. Patchy left-greater-than-right ground-glass opacities are not significantly changed from the recent prior studies. There is no confluent airspace opacity, significant pleural effusion or pneumothorax. Right lung granuloma noted. There are postsurgical changes at the right humeral head and in the lumbar spine. IMPRESSION: Stable bilateral ground-glass opacities as evaluated on CT 5 weeks ago. No new findings identified. Electronically Signed   By: Richardean Sale M.D.   On: 06/18/2015 12:06    Scheduled Meds: . aspirin EC  81 mg Oral Daily  . atorvastatin  10  mg Oral QHS  . azaTHIOprine  50 mg Oral BID  . Darunavir Ethanolate  800 mg Oral Q breakfast  . elvitegravir-cobicistat-emtricitabine-tenofovir  1 tablet Oral Q breakfast  . escitalopram  10 mg Oral Daily  . furosemide  20 mg Oral QPM  . levofloxacin (LEVAQUIN) IV  750 mg Intravenous Q24H  . loratadine  10 mg Oral Daily  . metoprolol succinate  25 mg Oral QHS  . pantoprazole  40 mg Oral Daily  . predniSONE  5 mg Oral BID WC  . pregabalin  200 mg Oral BID  . saccharomyces boulardii  250 mg Oral BID  . valACYclovir  500 mg Oral Daily  . vancomycin  1,250 mg Intravenous Q12H  . Warfarin - Physician Dosing Inpatient   Does not apply q1800   Continuous Infusions:   Principal Problem:   Community acquired pneumonia Active Problems:   CAD (coronary artery disease) of artery bypass graft  Type II diabetes mellitus with manifestations (HCC)   HIV disease ([place])   Chronic lymphoblastic leukemia ([place])   Hypertension   DVT, lower extremity, recurrent ([place])   Pneumonia   Dyspnea    Time spent: 30 minutes    [facility] Pager [phone]. If 7PM-7AM, please contact night-coverage at [email or web address], password [facility] 06/19/2015, 1:33 PM

## 2015-06-20 DIAGNOSIS — I82409 Acute embolism and thrombosis of unspecified deep veins of unspecified lower extremity: Secondary | ICD-10-CM

## 2015-06-20 DIAGNOSIS — J189 Pneumonia, unspecified organism: Principal | ICD-10-CM

## 2015-06-20 LAB — CBC
HCT: 39.7 % (ref 39.0–52.0)
Hemoglobin: 12.9 g/dL — ABNORMAL LOW (ref 13.0–17.0)
MCH: 28.9 pg (ref 26.0–34.0)
MCHC: 32.5 g/dL (ref 30.0–36.0)
MCV: 88.8 fL (ref 78.0–100.0)
Platelets: 115 10*3/uL — ABNORMAL LOW (ref 150–400)
RBC: 4.47 MIL/uL (ref 4.22–5.81)
RDW: 18.5 % — ABNORMAL HIGH (ref 11.5–15.5)
WBC: 15.1 10*3/uL — ABNORMAL HIGH (ref 4.0–10.5)

## 2015-06-20 LAB — BASIC METABOLIC PANEL
Anion gap: 7 (ref 5–15)
BUN: 16 mg/dL (ref 6–20)
CO2: 27 mmol/L (ref 22–32)
Calcium: 8.1 mg/dL — ABNORMAL LOW (ref 8.9–10.3)
Chloride: 106 mmol/L (ref 101–111)
Creatinine, Ser: 0.82 mg/dL (ref 0.61–1.24)
GFR calc Af Amer: >60 mL/min (ref >60)
GFR calc non Af Amer: >60 mL/min (ref >60)
Glucose, Bld: 104 mg/dL — ABNORMAL HIGH (ref 65–99)
Potassium: 3.7 mmol/L (ref 3.5–5.1)
Sodium: 140 mmol/L (ref 135–145)

## 2015-06-20 LAB — PROTIME-INR
INR: 2.22 — ABNORMAL HIGH (ref 0.00–1.49)
Prothrombin Time: 24.4 seconds — ABNORMAL HIGH (ref 11.6–15.2)

## 2015-06-20 MED ORDER — WARFARIN SODIUM 5 MG PO TABS
5.0000 mg | ORAL_TABLET | Freq: Once | ORAL | Status: DC
  Filled 2015-06-20: qty 1

## 2015-06-20 MED ORDER — PREDNISONE 20 MG PO TABS
40.0000 mg | ORAL_TABLET | Freq: Every day | ORAL | Status: DC
  Administered 2015-06-20: 40 mg via ORAL
  Filled 2015-06-20: qty 2

## 2015-06-20 MED ORDER — PREDNISONE 20 MG PO TABS: 40.0000 mg | ORAL_TABLET | Freq: Every day | ORAL | Status: DC

## 2015-06-20 MED ORDER — PREDNISONE 10 MG PO TABS: 10.0000 mg | ORAL_TABLET | Freq: Every day | ORAL | Status: DC

## 2015-06-20 MED ORDER — LEVOFLOXACIN 750 MG PO TABS: 750.0000 mg | ORAL_TABLET | Freq: Every day | ORAL | Status: DC

## 2015-06-20 MED ORDER — WARFARIN - PHARMACIST DOSING INPATIENT
Status: DC
Start: 2015-06-20 — End: 2015-06-20

## 2015-06-20 NOTE — Progress Notes (Signed)
Patient alert and oriented, independent, VSS, pt. Tolerating diet well. No complaints of pain or nausea. Pt. Had IV removed tip intact. Pt. Had prescriptions given. Pt. Voiced understanding of discharge instructions with no further questions. Pt. Discharged via wheelchair with nurse tech.  

## 2015-06-20 NOTE — Discharge Instructions (Signed)
Take prednisone 40 mg daily for 7 days, and then decrease dose by 10 mg daily until you are back on your dose of 5 mg twice daily.

## 2015-06-20 NOTE — Care Management Note (Signed)
Case Management Note  Patient Details  Name: [name] MRN: [id number] Date of Birth: 12-14-1950  Expected Discharge Date:    07/10/2015              Expected Discharge Plan:  Home/Self Care  In-House Referral:  NA  Discharge planning Services  CM Consult  Post Acute Care Choice:  NA Choice offered to:  NA  DME Arranged:    DME Agency:     HH Arranged:    [place] Agency:     Status of Service:  Completed, signed off  Medicare Important Message Given:    Date Medicare IM Given:    Medicare IM give by:    Date Additional Medicare IM Given:    Additional Medicare Important Message give by:     If discussed at [place] of Stay Meetings, dates discussed:    Additional Comments: Pt discharging home today with self care. No CM needs.   Sherald Barge, RN 06/20/2015, 1:55 PM

## 2015-06-20 NOTE — Progress Notes (Signed)
ANTICOAGULATION CONSULT NOTE - follow up  Pharmacy Consult for Coumadin Indication: VTE Treatment (h/o DVT)  Allergies  Allergen Reactions  . Morphine Other (See Comments)    REACTION: severe headache  . Other Anaphylaxis and Hives    Pecan  . Peanut-Containing Drug Products Anaphylaxis and Hives    Swelling of throat  . Oxycodone-Acetaminophen Other (See Comments)    REACTION: headache  . Penicillins Rash    Has patient had a PCN reaction causing immediate rash, facial/tongue/throat swelling, SOB or lightheadedness with hypotension: Yes Has patient had a PCN reaction causing severe rash involving mucus membranes or skin necrosis: No Has patient had a PCN reaction that required hospitalization No Has patient had a PCN reaction occurring within the last 10 years: No If all of the above answers are "NO", then may proceed with Cephalosporin use.   REACTION: red, flushed  . Promethazine Hcl Other (See Comments)    REACTION: makes him feel drunk   Patient Measurements: Height: 5\' 7"  (170.2 cm) Weight: 190 lb 14.4 oz (86.592 kg) IBW/kg (Calculated) : 66.1  Vital Signs: Temp: 98.3 F (36.8 C) (02/08 0415) Temp Source: Oral (02/08 0415) BP: 116/67 mmHg (02/08 0415) Pulse Rate: 73 (02/08 0415)  Labs:  Recent Labs  06/18/15 1125 06/18/15 1128 06/19/15 0531 06/19/15 1848 06/20/15 0520  HGB 13.7  --  13.1  --  12.9*  HCT 42.4  --  40.1  --  39.7  PLT 106*  --  119*  --  115*  LABPROT  --  27.6*  --  30.6* 24.4*  INR  --  2.62*  --  3.00* 2.22*  CREATININE 0.95  --   --   --  0.82   Estimated Creatinine Clearance: 91.9 mL/min (by C-G formula based on Cr of 0.82).  Medical History: Past Medical History  Diagnosis Date  . Osteoarthritis, knee     s/p B TKA  . HIV infection ([place]) dx 1993  . TIA (transient ischemic attack) 1997    mild residual L mouth droop  . H/O hiatal hernia 2008    surgery  . Gynecomastia, male   . Impotence of organic origin   . Rheumatoid  arthritis(714.0) dx 2010    MTX, follows with rheum  . Gout   . Chronic back pain     follows with Nsurg  . Seasonal allergies   . Diverticulosis   . Gallstones   . Status post dilation of esophageal narrowing   . GERD (gastroesophageal reflux disease)   . Diverticulosis   . Esophagitis   . Hemorrhoids   . Tubular adenoma of colon   . Myocardial infarction ([place]) 2010     x 2  . Hypertension   . DVT, lower extremity, recurrent ([place]) 2008, 2009    LLE, chronic anticoag since 2009  . Carotid artery occlusion     40-60% right ICA stenosis (09/2008)  . Neuromuscular disorder (HCC)     neuropathy  . Fibromyalgia   . Hepatitis A yrs ago  . CLL (chronic lymphoblastic leukemia) dx 2010    Followed at [facility] q9mo, no current therapy   . Secondary syphilis 07/24/14 dx    s/p 2 wks doxy  . Type II diabetes mellitus with manifestations ([place]) 04/02/2011  . Coronary artery disease 2010    s/p CABG '10, sees Dr. Percival Spanish   Assessment: Continuation of Coumadin PTA for recurrent DVT  Anti-coag visit 06/01/2015 INR 4.8 and dose reduced to Coumadin 5 mg daily after  holding for 2 days INR 2.62 on admission, INR 2.22 today (was 3.0 yesterday and warfarin was held)  Goal of Therapy:  INR 2-3 Monitor platelets by anticoagulation protocol   Plan:  Coumadin 5mg  today x 1. INR/PT daily Monitor for signs of bleeding  Hart Robinsons A 06/20/2015,12:56 PM

## 2015-06-20 NOTE — Consult Note (Signed)
Consult requested by: [facility] Consult requested for pneumonia:  HPI: This is a 65 year old who has multiple medical problems including rheumatoid arthritis, HIV infection chronic lymphocytic leukemia apparently cardiac disease peripheral arterial disease chronic back pain. He was in his usual state of poor health when he developed increasing shortness of breath and went to his primary care physician. He had a chest x-ray and was referred to the emergency department. There was some question as to whether he had pneumonia and pulmonary fibrosis or pulmonary edema. He was treated from the emergency department and discharged home. However he has continued to worsen. He was treated for volume overload. At home he's had increasing cough congestion but has not had any fever. Since she's been in the hospital he has improved. He has been on prednisone in a tapering dose.   Past Medical History  Diagnosis Date  . Osteoarthritis, knee     s/p B TKA  . HIV infection ([place]) dx 1993  . TIA (transient ischemic attack) 1997    mild residual L mouth droop  . H/O hiatal hernia 2008    surgery  . Gynecomastia, male   . Impotence of organic origin   . Rheumatoid arthritis(714.0) dx 2010    MTX, follows with rheum  . Gout   . Chronic back pain     follows with Nsurg  . Seasonal allergies   . Diverticulosis   . Gallstones   . Status post dilation of esophageal narrowing   . GERD (gastroesophageal reflux disease)   . Diverticulosis   . Esophagitis   . Hemorrhoids   . Tubular adenoma of colon   . Myocardial infarction ([place]) 2010     x 2  . Hypertension   . DVT, lower extremity, recurrent ([place]) 2008, 2009    LLE, chronic anticoag since 2009  . Carotid artery occlusion     40-60% right ICA stenosis (09/2008)  . Neuromuscular disorder (H[facility])     neuropathy  . Fibromyalgia   . Hepatitis A yrs ago  . CLL (chronic lymphoblastic leukemia) dx 2010    Followed at [facility] q55mo, no current therapy   .  Secondary syphilis 07/24/14 dx    s/p 2 wks doxy  . Type II diabetes mellitus with manifestations ([place]) 04/02/2011  . Coronary artery disease 2010    s/p CABG '10, sees Dr. Percival Spanish     Family History  Problem Relation Age of Onset  . Breast cancer Mother   . Hypertension Mother   . Hyperlipidemia Mother   . Diabetes Mother   . Prostate cancer Father   . Colon polyps Father   . Hyperlipidemia Father   . Crohn's disease Paternal Aunt   . Diabetes Maternal Grandmother   . Diabetes Brother     x 3  . Heart disease Brother     x 3  . Hyperlipidemia Brother     x 3  . Colon cancer Neg Hx   . Alcohol abuse Daughter   . Drug abuse Daughter   . Asthma Brother      Social History   Social History  . Marital Status: Widowed    Spouse Name: N/A  . Number of Children: 3  . Years of Education: N/A   Occupational History  . retired    Social History Main Topics  . Smoking status: Never Smoker   . Smokeless tobacco: Never Used     Comment: occ wine  . Alcohol Use: Yes  Comment: occasional wine- 1-2 per week  . Drug Use: No  . Sexual Activity: No     Comment: pt. given condoms   Other Topics Concern  . None   Social History Narrative     ROS: He has been coughing nonproductively. He has not noticed any swelling. He is short of breath. He says he feels much better. He has no hemoptysis. Otherwise per the history and physical    Objective: Vital signs in last 24 hours: Temp:  [98.1 F (36.7 C)-99.2 F (37.3 C)] 98.3 F (36.8 C) (02/08 0415) Pulse Rate:  [73-85] 73 (02/08 0415) Resp:  [18-19] 19 (02/08 0415) BP: (115-141)/(66-93) 116/67 mmHg (02/08 0415) SpO2:  [94 %-95 %] 95 % (02/08 0415) Weight change:  Last BM Date: 06/19/15  Intake/Output from previous day: 02/07 0701 - 02/08 0700 In: 1660 [P.O.:1010; IV Piggyback:650] Out: [id number] [Urine:4150]  PHYSICAL EXAM He is a thin male in no acute distress. His pupils are reactive nose and throat are clear  mucous membranes are moist his neck is supple without masses. His chest shows rales bilaterally. His heart is regular without gallop. His abdomen is soft without masses bowel sounds present and active he does not have any edema of the extremities. Central nervous system examination is grossly intact  Lab Results: Basic Metabolic Panel:  Recent Labs  06/18/15 1125 06/20/15 0520  NA 137 140  K 4.1 3.7  CL 105 106  CO2 25 27  GLUCOSE 96 104*  BUN 29* 16  CREATININE 0.95 0.82  CALCIUM 8.0* 8.1*   Liver Function Tests:  Recent Labs  06/18/15 1125  AST 30  ALT 25  ALKPHOS 64  BILITOT 0.8  PROT 5.6*  ALBUMIN 3.1*   No results for input(s): LIPASE, AMYLASE in the last 72 hours. No results for input(s): AMMONIA in the last 72 hours. CBC:  Recent Labs  06/18/15 1125 06/19/15 0531 06/20/15 0520  WBC 16.7* 14.0* 15.1*  NEUTROABS 3.7 2.0  --   HGB 13.7 13.1 12.9*  HCT 42.4 40.1 39.7  MCV 89.3 89.1 88.8  PLT 106* 119* 115*   Cardiac Enzymes: No results for input(s): CKTOTAL, CKMB, CKMBINDEX, TROPONINI in the last 72 hours. BNP: No results for input(s): PROBNP in the last 72 hours. D-Dimer: No results for input(s): DDIMER in the last 72 hours. CBG: No results for input(s): GLUCAP in the last 72 hours. Hemoglobin A1C: No results for input(s): HGBA1C in the last 72 hours. Fasting Lipid Panel: No results for input(s): CHOL, HDL, LDLCALC, TRIG, CHOLHDL, LDLDIRECT in the last 72 hours. Thyroid Function Tests: No results for input(s): TSH, T4TOTAL, FREET4, T3FREE, THYROIDAB in the last 72 hours. Anemia Panel: No results for input(s): VITAMINB12, FOLATE, FERRITIN, TIBC, IRON, RETICCTPCT in the last 72 hours. Coagulation:  Recent Labs  06/19/15 1848 06/20/15 0520  LABPROT 30.6* 24.4*  INR 3.00* 2.22*   Urine Drug Screen: Drugs of Abuse  No results found for: LABOPIA, COCAINSCRNUR, LABBENZ, AMPHETMU, THCU, LABBARB  Alcohol Level: No results for input(s): ETH in the  last 72 hours. Urinalysis:  Recent Labs  06/18/15 [place] 1.020  PHURINE 6.5  GLUCOSEU NEGATIVE  HGBUR NEGATIVE  BILIRUBINUR NEGATIVE  KETONESUR TRACE*  PROTEINUR NEGATIVE  NITRITE NEGATIVE  LEUKOCYTESUR NEGATIVE   Misc. Labs:   ABGS: No results for input(s): PHART, PO2ART, TCO2, HCO3 in the last 72 hours.  Invalid input(s): PCO2   MICROBIOLOGY: Recent Results (from the past 240 hour(s))  Urine  culture     Status: None   Collection Time: 06/18/15 11:51 AM  Result Value Ref Range Status   Specimen Description URINE, CLEAN CATCH  Final   Special Requests NONE  Final   Culture   Final    6,000 COLONIES/mL INSIGNIFICANT GROWTH Performed at [facility]    Report Status 06/19/2015 FINAL  Final  Blood culture (routine x 2)     Status: None (Preliminary result)   Collection Time: 06/18/15  2:09 PM  Result Value Ref Range Status   Specimen Description BLOOD LEFT ANTECUBITAL  Final   Special Requests   Final    BOTTLES DRAWN AEROBIC AND ANAEROBIC AEB=12CC ANA=10CC   Culture NO GROWTH < 24 HOURS  Final   Report Status PENDING  Incomplete  Blood culture (routine x 2)     Status: None (Preliminary result)   Collection Time: 06/18/15  2:16 PM  Result Value Ref Range Status   Specimen Description BLOOD RIGHT ANTECUBITAL  Final   Special Requests BOTTLES DRAWN AEROBIC AND ANAEROBIC 8CC EACH  Final   Culture NO GROWTH < 24 HOURS  Final   Report Status PENDING  Incomplete    Studies/Results: Dg Chest 2 View  06/18/2015  CLINICAL DATA:  Body aches with fever with for 4 days. Cough. Multiple prior surgeries. EXAM: CHEST  2 VIEW COMPARISON:  CT 05/11/2015.  Radiographs 04/26/2015. FINDINGS: The heart size and mediastinal contours are stable status post median sternotomy and CABG. Patchy left-greater-than-right ground-glass opacities are not significantly changed from the recent prior studies. There is no confluent airspace opacity, significant  pleural effusion or pneumothorax. Right lung granuloma noted. There are postsurgical changes at the right humeral head and in the lumbar spine. IMPRESSION: Stable bilateral ground-glass opacities as evaluated on CT 5 weeks ago. No new findings identified. Electronically Signed   By: Richardean Sale M.D.   On: 06/18/2015 12:06   Ct Chest W Contrast  06/19/2015  CLINICAL DATA:  Dyspnea. Mildly productive cough with dark foci in the sputum. HIV positive. EXAM: CT CHEST WITH CONTRAST TECHNIQUE: Multidetector CT imaging of the chest was performed during intravenous contrast administration. CONTRAST:  56mL OMNIPAQUE IOHEXOL 300 MG/ML  SOLN COMPARISON:  Chest radiographs dated 06/18/2015 and high-resolution chest CT dated 05/11/2015. FINDINGS: Mediastinum/Lymph Nodes: Multiple enlarged mediastinal lymph nodes are slightly smaller. These include an enlarged left superior mediastinal node with a short axis diameter of 13 mm on image number 12 of series 2, previously 20 mm. A previous right peritracheal node with a short axis diameter of 19 mm has a short axis diameter of 18 mm on image number 24 of series 2. A previously demonstrated 22 mm short axis subcarinal node continues to have a short axis diameter of 22 mm on corresponding image number 30 of series 2. An enlarged right hilar node currently has a short axis diameter of 19 mm on image number 36 of series 2, unchanged. A previously demonstrated 12 mm short axis left axillary node has a short axis diameter of 7 mm today on image number 20 of series 2. A previously demonstrated 9 mm short axis right axillary node has a short axis diameter of 5 mm today. Multiple of the enlarged lymph nodes contain punctate calcifications, unchanged. Atheromatous coronary artery calcifications are noted as well as post CABG changes. A small to moderate-sized hiatal hernia is again demonstrated. Lungs/Pleura: Progressive patchy opacity in both lungs including all lobes, most pronounced in  the lower lobes. No nodules are  visualized. No pleural fluid. Upper abdomen: Multiple calcified granulomata in the spleen. Cholecystectomy clips. No enlarged lymph nodes seen. Musculoskeletal: Thoracic and lower cervical spine degenerative changes, including changes of DISH. IMPRESSION: 1. Progressive patchy multilobar opacities in both lungs, compatible with progressive infection/inflammation. 2. Mildly improved mediastinal adenopathy, stable right hilar adenopathy and significantly improved bilateral axillary adenopathy. Electronically Signed   By: Claudie Revering M.D.   On: 06/19/2015 15:00    Medications:  Prior to Admission:  Prescriptions prior to admission  Medication Sig Dispense Refill Last Dose  . aspirin EC 81 MG tablet Take 81 mg by mouth daily.   06/18/2015 at Unknown time  . atorvastatin (LIPITOR) 10 MG tablet Take 1 tablet (10 mg total) by mouth at bedtime. (Patient taking differently: Take 10 mg by mouth every morning. ) 90 tablet 3 06/18/2015 at Unknown time  . azaTHIOprine (IMURAN) 50 MG tablet Take 50 mg by mouth 2 (two) times daily.  3 06/18/2015 at Unknown time  . EPINEPHrine (EPIPEN 2-PAK) 0.3 mg/0.3 mL IJ SOAJ injection Inject 0.3 mLs (0.3 mg total) into the muscle once. 2 Device 0 UNKNOWN  . escitalopram (LEXAPRO) 10 MG tablet Take 1 tablet (10 mg total) by mouth daily. 90 tablet 1 06/18/2015 at Unknown time  . furosemide (LASIX) 20 MG tablet Take 1 tablet (20 mg total) by mouth 2 (two) times daily. (Patient taking differently: Take 20 mg by mouth every evening. ) 60 tablet 5 06/17/2015 at Unknown time  . GENVOYA 150-150-200-10 MG TABS tablet TAKE 1 TABLET BY MOUTH DAILY WITH BREAKFAST. 30 tablet 5 06/18/2015 at 830  . ibuprofen (ADVIL,MOTRIN) 200 MG tablet Take 200 mg by mouth every 6 (six) hours as needed for headache.   06/18/2015 at MORNING  . inFLIXimab (REMICADE) 100 MG injection Inject into the vein every 6 (six) weeks.    05/24/2015  . loratadine (CLARITIN) 10 MG tablet Take 1 tablet  (10 mg total) by mouth daily. 90 tablet 3 06/17/2015 at Unknown time  . LYRICA 200 MG capsule TAKE 1 CAPSULE BY MOUTH TWICE DAILY 60 capsule 5 06/18/2015 at Unknown time  . metoprolol succinate (TOPROL-XL) 25 MG 24 hr tablet Take 1 tablet (25 mg total) by mouth at bedtime. 30 tablet 11 06/17/2015 at 2200  . pantoprazole (PROTONIX) 40 MG tablet TAKE 1 TABLET EVERY DAY 60 tablet 0 06/18/2015 at Unknown time  . predniSONE (DELTASONE) 5 MG tablet Take 5 mg by mouth 2 (two) times daily.   06/18/2015 at Unknown time  . PREZISTA 800 MG tablet TAKE 1 TABLET (800 MG TOTAL) BY MOUTH DAILY. 30 tablet 5 06/18/2015 at 830a  . saccharomyces boulardii (FLORASTOR) 250 MG capsule Take 1 capsule (250 mg total) by mouth 2 (two) times daily. 60 capsule 1 06/18/2015 at Unknown time  . urea (CARMOL) 10 % cream APPLY TOPICALLY AS NEEDED. (Patient taking differently: APPLY TOPICALLY AS NEEDED -APPLIED TO BUTTOCKS) 85 g 0 Past Week at Unknown time  . valACYclovir (VALTREX) 500 MG tablet TAKE 1 TABLET BY MOUTH EVERY DAY 90 tablet 3 06/18/2015 at Unknown time  . warfarin (COUMADIN) 10 MG tablet TAKE 1/2 ON WEDNESDAY AND 1 TABLET ALL OTHER DAYS (Patient taking differently: TAKE 10MG  ON WEDNESDAYS AND SATURDAYS, THEN TAKE 5MG  ON ALL OTHER DAYS) 60 tablet 3 06/17/2015 at 2200  . zolpidem (AMBIEN) 5 MG tablet TAKE 1 TABLET BY MOUTH AT BEDTIME AS NEEDED FOR SLEEP 30 tablet 2 06/16/2015 at Unknown time  . diphenoxylate-atropine (LOMOTIL) 2.5-0.025 MG per tablet  Take 1 tablet by mouth 4 (four) times daily as needed for diarrhea or loose stools. (Patient not taking: Reported on 06/18/2015) 30 tablet 1 Taking  . ondansetron (ZOFRAN ODT) 4 MG disintegrating tablet 4mg  ODT q6 hours prn nausea/vomit (Patient not taking: Reported on 06/18/2015) 20 tablet 0 Taking   Scheduled: . aspirin EC  81 mg Oral Daily  . atorvastatin  10 mg Oral QHS  . azaTHIOprine  50 mg Oral BID  . Darunavir Ethanolate  800 mg Oral Q breakfast  .  elvitegravir-cobicistat-emtricitabine-tenofovir  1 tablet Oral Q breakfast  . escitalopram  10 mg Oral Daily  . furosemide  20 mg Oral QPM  . levofloxacin (LEVAQUIN) IV  750 mg Intravenous Q24H  . loratadine  10 mg Oral Daily  . metoprolol succinate  25 mg Oral QHS  . pantoprazole  40 mg Oral Daily  . predniSONE  40 mg Oral Q breakfast  . pregabalin  200 mg Oral BID  . saccharomyces boulardii  250 mg Oral BID  . valACYclovir  500 mg Oral Daily  . Warfarin - Pharmacist Dosing Inpatient   Does not apply Q24H   Continuous:  [id number] **OR** acetaminophen, ibuprofen, zolpidem  Assesment: I think he has multilobar pneumonia. He is improving on Levaquin so I would continue that. He says he feels well and has some interest in trying to go home today. If this is arranged I think he needs Levaquin for at least 2 weeks at home with a reassessment after that 2 week point to see if he needs further treatment. I do think he needs an increased dose of prednisone for now and I put him on 40 mg and then I would taper him back to his 5 mg twice a day over the next 2 weeks or so. I think follow-up will probably have to be at least to some extent with CT. There is some possibility that he has pulmonary fibrosis but I think that's much less likely. Principal Problem:   Community acquired pneumonia Active Problems:   CAD (coronary artery disease) of artery bypass graft   Type II diabetes mellitus with manifestations ([facility])   HIV disease ([place])   Chronic lymphoblastic leukemia ([place])   Hypertension   DVT, lower extremity, recurrent ([place])   Pneumonia   Dyspnea    Plan: Increase prednisone. Otherwise as above.  Thanks for allowing me to see him with you    LOS: 1 day   Decklyn Hornik L 06/20/2015, 8:43 AM

## 2015-06-20 NOTE — Care Management Important Message (Signed)
Important Message  Patient Details  Name: KACEN FOSSEN MRN: [id number] Date of Birth: Sep 27, 1950   Medicare Important Message Given:  N/A - LOS <3 / Initial given by admissions    Sherald Barge, RN 06/20/2015, 1:56 PM

## 2015-06-20 NOTE — Discharge Summary (Signed)
Physician Discharge Summary  PAULO NUMA [id number] DOB: [birth year] DOA: 06/18/2015  PCP: [name], MD  Admit date: 06/18/2015 Discharge date: 06/20/2015  Time spent: 45 minutes  Recommendations for Outpatient Follow-up:  -We'll be discharge home today. -Advised to follow-up with primary care provider in 2 weeks. -Recommend repeat CT scan of the chest in 4-6 weeks to ensure complete resolution of pneumonia and follow-up of potential pulmonary fibrosis.   Discharge Diagnoses:  Principal Problem:   Community acquired pneumonia Active Problems:   CAD (coronary artery disease) of artery bypass graft   Type II diabetes mellitus with manifestations (HCC)   HIV disease ([place])   Chronic lymphoblastic leukemia ([place])   Hypertension   DVT, lower extremity, recurrent ([place])   Pneumonia   Dyspnea   Discharge Condition: Stable and improved  Filed Weights   06/18/15 1109 06/18/15 1700  Weight: 88.451 kg (195 lb) 86.592 kg (190 lb 14.4 oz)    History of present illness:  As per Dr. Wyline Copas on 2/6: Mr. Turan Quintiliani is a very pleasant man with a PMH significant for HIV, RA, recurrent DVT (on long term warfarin therapy), CLL (in remission), HTN, Type II diabetes mellitus who presents to the ED for dark spots in sputum this morning and increased work of breathing and respiratory distress. Patient states he is taking deep breaths but feels a burning sensation across his chest and doesn't feel like he is moving any significant amount of air into or out of his chest. He mentions he has not been around any sick contacts, never misses his HIV medication, but recently was started on steroids for RA (a short course his azathioprine takes effect) and did recently tear down moldy dry wall 1.5-2 weeks ago. Patient is not sure if the mold is what started his respiratory issues. Patient reports 2-3 days of feeling very lethargic, drained of energy and having a fever. Fevers ran up to 102-103 but did  come down with ibuprofen.  Hospital Course:   Multilobar pneumonia -Seems to be clinically improving, continue Levaquin for 10 days post discharge home today. -Also with potential pulmonary fibrosis. Has been seen in consultation by Dr. Luan Pulling who recommends increased dose of prednisone of 40 mg daily for a week and continue to taper him down to his regular dose of 5 mg twice daily. -We'll reassess with repeat CT scan of the chest in about 6 weeks.  HIV -Continue antiretroviral therapy, outpatient follow-up with ID as scheduled.  Rheumatoid arthritis -Continue steroids and azathioprine.  Recurrent DVT -Continue warfarin.  Procedures:  None   Consultations:  Pulmonary, Dr. Luan Pulling  Discharge Instructions  Discharge Instructions    Diet - low sodium heart healthy    Complete by:  As directed      Increase activity slowly    Complete by:  As directed             Medication List    STOP taking these medications        diphenoxylate-atropine 2.5-0.025 MG tablet  Commonly known as:  LOMOTIL     ondansetron 4 MG disintegrating tablet  Commonly known as:  ZOFRAN ODT      TAKE these medications        aspirin EC 81 MG tablet  Take 81 mg by mouth daily.     atorvastatin 10 MG tablet  Commonly known as:  LIPITOR  Take 1 tablet (10 mg total) by mouth at bedtime.     azaTHIOprine 50  MG tablet  Commonly known as:  IMURAN  Take 50 mg by mouth 2 (two) times daily.     EPINEPHrine 0.3 mg/0.3 mL Soaj injection  Commonly known as:  EPIPEN 2-PAK  Inject 0.3 mLs (0.3 mg total) into the muscle once.     escitalopram 10 MG tablet  Commonly known as:  LEXAPRO  Take 1 tablet (10 mg total) by mouth daily.     furosemide 20 MG tablet  Commonly known as:  LASIX  Take 1 tablet (20 mg total) by mouth 2 (two) times daily.     GENVOYA 150-150-200-10 MG Tabs tablet  Generic drug:  elvitegravir-cobicistat-emtricitabine-tenofovir  TAKE 1 TABLET BY MOUTH DAILY WITH BREAKFAST.       ibuprofen 200 MG tablet  Commonly known as:  ADVIL,MOTRIN  Take 200 mg by mouth every 6 (six) hours as needed for headache.     levofloxacin 750 MG tablet  Commonly known as:  LEVAQUIN  Take 1 tablet (750 mg total) by mouth daily.     loratadine 10 MG tablet  Commonly known as:  CLARITIN  Take 1 tablet (10 mg total) by mouth daily.     LYRICA 200 MG capsule  Generic drug:  pregabalin  TAKE 1 CAPSULE BY MOUTH TWICE DAILY     metoprolol succinate 25 MG 24 hr tablet  Commonly known as:  TOPROL-XL  Take 1 tablet (25 mg total) by mouth at bedtime.     pantoprazole 40 MG tablet  Commonly known as:  PROTONIX  TAKE 1 TABLET EVERY DAY     predniSONE 20 MG tablet  Commonly known as:  DELTASONE  Take 2 tablets (40 mg total) by mouth daily with breakfast.     predniSONE 10 MG tablet  Commonly known as:  DELTASONE  Take 1 tablet (10 mg total) by mouth daily with breakfast. Take 6 tablets today and then decrease by 1 tablet daily until none are left.     PREZISTA 800 MG tablet  Generic drug:  Darunavir Ethanolate  TAKE 1 TABLET (800 MG TOTAL) BY MOUTH DAILY.     REMICADE 100 MG injection  Generic drug:  inFLIXimab  Inject into the vein every 6 (six) weeks.     saccharomyces boulardii 250 MG capsule  Commonly known as:  FLORASTOR  Take 1 capsule (250 mg total) by mouth 2 (two) times daily.     urea 10 % cream  Commonly known as:  CARMOL  APPLY TOPICALLY AS NEEDED.     valACYclovir 500 MG tablet  Commonly known as:  VALTREX  TAKE 1 TABLET BY MOUTH EVERY DAY     warfarin 10 MG tablet  Commonly known as:  COUMADIN  TAKE 1/2 ON WEDNESDAY AND 1 TABLET ALL OTHER DAYS     zolpidem 5 MG tablet  Commonly known as:  AMBIEN  TAKE 1 TABLET BY MOUTH AT BEDTIME AS NEEDED FOR SLEEP       Allergies  Allergen Reactions  . Morphine Other (See Comments)    REACTION: severe headache  . Other Anaphylaxis and Hives    Pecan  . Peanut-Containing Drug Products Anaphylaxis and Hives     Swelling of throat  . Oxycodone-Acetaminophen Other (See Comments)    REACTION: headache  . Penicillins Rash    Has patient had a PCN reaction causing immediate rash, facial/tongue/throat swelling, SOB or lightheadedness with hypotension: Yes Has patient had a PCN reaction causing severe rash involving mucus membranes or skin necrosis: No Has patient  had a PCN reaction that required hospitalization No Has patient had a PCN reaction occurring within the last 10 years: No If all of the above answers are "NO", then may proceed with Cephalosporin use.   REACTION: red, flushed  . Promethazine Hcl Other (See Comments)    REACTION: makes him feel drunk      The results of significant diagnostics from this hospitalization (including imaging, microbiology, ancillary and laboratory) are listed below for reference.    Significant Diagnostic Studies: Dg Chest 2 View  06/18/2015  CLINICAL DATA:  Body aches with fever with for 4 days. Cough. Multiple prior surgeries. EXAM: CHEST  2 VIEW COMPARISON:  CT 05/11/2015.  Radiographs 04/26/2015. FINDINGS: The heart size and mediastinal contours are stable status post median sternotomy and CABG. Patchy left-greater-than-right ground-glass opacities are not significantly changed from the recent prior studies. There is no confluent airspace opacity, significant pleural effusion or pneumothorax. Right lung granuloma noted. There are postsurgical changes at the right humeral head and in the lumbar spine. IMPRESSION: Stable bilateral ground-glass opacities as evaluated on CT 5 weeks ago. No new findings identified. Electronically Signed   By: Richardean Sale M.D.   On: 06/18/2015 12:06   Ct Chest W Contrast  06/19/2015  CLINICAL DATA:  Dyspnea. Mildly productive cough with dark foci in the sputum. HIV positive. EXAM: CT CHEST WITH CONTRAST TECHNIQUE: Multidetector CT imaging of the chest was performed during intravenous contrast administration. CONTRAST:  7mL  OMNIPAQUE IOHEXOL 300 MG/ML  SOLN COMPARISON:  Chest radiographs dated 06/18/2015 and high-resolution chest CT dated 05/11/2015. FINDINGS: Mediastinum/Lymph Nodes: Multiple enlarged mediastinal lymph nodes are slightly smaller. These include an enlarged left superior mediastinal node with a short axis diameter of 13 mm on image number 12 of series 2, previously 20 mm. A previous right peritracheal node with a short axis diameter of 19 mm has a short axis diameter of 18 mm on image number 24 of series 2. A previously demonstrated 22 mm short axis subcarinal node continues to have a short axis diameter of 22 mm on corresponding image number 30 of series 2. An enlarged right hilar node currently has a short axis diameter of 19 mm on image number 36 of series 2, unchanged. A previously demonstrated 12 mm short axis left axillary node has a short axis diameter of 7 mm today on image number 20 of series 2. A previously demonstrated 9 mm short axis right axillary node has a short axis diameter of 5 mm today. Multiple of the enlarged lymph nodes contain punctate calcifications, unchanged. Atheromatous coronary artery calcifications are noted as well as post CABG changes. A small to moderate-sized hiatal hernia is again demonstrated. Lungs/Pleura: Progressive patchy opacity in both lungs including all lobes, most pronounced in the lower lobes. No nodules are visualized. No pleural fluid. Upper abdomen: Multiple calcified granulomata in the spleen. Cholecystectomy clips. No enlarged lymph nodes seen. Musculoskeletal: Thoracic and lower cervical spine degenerative changes, including changes of DISH. IMPRESSION: 1. Progressive patchy multilobar opacities in both lungs, compatible with progressive infection/inflammation. 2. Mildly improved mediastinal adenopathy, stable right hilar adenopathy and significantly improved bilateral axillary adenopathy. Electronically Signed   By: Claudie Revering M.D.   On: 06/19/2015 15:00     Microbiology: Recent Results (from the past 240 hour(s))  Urine culture     Status: None   Collection Time: 06/18/15 11:51 AM  Result Value Ref Range Status   Specimen Description URINE, CLEAN CATCH  Final   Special Requests NONE  Final   Culture   Final    6,000 COLONIES/mL INSIGNIFICANT GROWTH Performed at [facility]    Report Status 06/19/2015 FINAL  Final  Blood culture (routine x 2)     Status: None (Preliminary result)   Collection Time: 06/18/15  2:09 PM  Result Value Ref Range Status   Specimen Description BLOOD LEFT ANTECUBITAL  Final   Special Requests   Final    BOTTLES DRAWN AEROBIC AND ANAEROBIC AEB=12CC ANA=10CC   Culture NO GROWTH 2 DAYS  Final   Report Status PENDING  Incomplete  Blood culture (routine x 2)     Status: None (Preliminary result)   Collection Time: 06/18/15  2:16 PM  Result Value Ref Range Status   Specimen Description BLOOD RIGHT ANTECUBITAL  Final   Special Requests BOTTLES DRAWN AEROBIC AND ANAEROBIC 8CC EACH  Final   Culture NO GROWTH 2 DAYS  Final   Report Status PENDING  Incomplete     Labs: Basic Metabolic Panel:  Recent Labs Lab 06/18/15 1125 06/20/15 0520  NA 137 140  K 4.1 3.7  CL 105 106  CO2 25 27  GLUCOSE 96 104*  BUN 29* 16  CREATININE 0.95 0.82  CALCIUM 8.0* 8.1*   Liver Function Tests:  Recent Labs Lab 06/18/15 1125  AST 30  ALT 25  ALKPHOS 64  BILITOT 0.8  PROT 5.6*  ALBUMIN 3.1*   No results for input(s): LIPASE, AMYLASE in the last 168 hours. No results for input(s): AMMONIA in the last 168 hours. CBC:  Recent Labs Lab 06/18/15 1125 06/19/15 0531 06/20/15 0520  WBC 16.7* 14.0* 15.1*  NEUTROABS 3.7 2.0  --   HGB 13.7 13.1 12.9*  HCT 42.4 40.1 39.7  MCV 89.3 89.1 88.8  PLT 106* 119* 115*   Cardiac Enzymes: No results for input(s): CKTOTAL, CKMB, CKMBINDEX, TROPONINI in the last 168 hours. BNP: BNP (last 3 results)  Recent Labs  04/20/15 1153  BNP 164.7*    ProBNP (last 3  results)  Recent Labs  04/19/15 1717 04/26/15 1653  PROBNP 189.0* 182.0*    CBG: No results for input(s): GLUCAP in the last 168 hours.     SignedLelon Frohlich  [facility] Pager: [phone] 06/20/2015, 1:36 PM

## 2015-06-21 ENCOUNTER — Ambulatory Visit (INDEPENDENT_AMBULATORY_CARE_PROVIDER_SITE_OTHER): Payer: Medicare Other | Admitting: General Practice

## 2015-06-21 ENCOUNTER — Telehealth: Payer: Self-pay | Admitting: *Deleted

## 2015-06-21 LAB — POCT INR: INR: 2.2

## 2015-06-21 NOTE — Progress Notes (Signed)
I have reviewed and agree with the plan. 

## 2015-06-21 NOTE — Telephone Encounter (Signed)
Transition Care Management Follow-up Telephone Call   Date discharged? 06/20/15   How have you been since you were released from the hospital? Pt states he is doing ok   Do you understand why you were in the hospital? YES   Do you understand the discharge instructions? YES   Where were you discharged to? Home   Items Reviewed:  Medications reviewed: YES  Allergies reviewed: YES  Dietary changes reviewed: NO  Referrals reviewed: No referral needed   Functional Questionnaire:   Activities of Daily Living (ADLs):   He states she are independent in the following: ambulation, bathing and hygiene, feeding, continence, grooming, toileting and dressing States he doesn't require assistance   Any transportation issues/concerns?: NO   Any patient concerns? NO   Confirmed importance and date/time of follow-up visits scheduled YES, 06/28/15  Provider Appointment booked with Dr. Ronnald Ramp  Confirmed with patient if condition begins to worsen call PCP or go to the ER.  Patient was given the office number and encouraged to call back with question or concerns.  : YES

## 2015-06-21 NOTE — Progress Notes (Signed)
Pre visit review using our clinic review tool, if applicable. No additional management support is needed unless otherwise documented below in the visit note. 

## 2015-06-23 LAB — CULTURE, BLOOD (ROUTINE X 2)
Culture: NO GROWTH
Culture: NO GROWTH

## 2015-06-28 ENCOUNTER — Ambulatory Visit (INDEPENDENT_AMBULATORY_CARE_PROVIDER_SITE_OTHER): Payer: Medicare Other | Admitting: General Practice

## 2015-06-28 ENCOUNTER — Encounter: Payer: Self-pay | Admitting: Internal Medicine

## 2015-06-28 ENCOUNTER — Ambulatory Visit (INDEPENDENT_AMBULATORY_CARE_PROVIDER_SITE_OTHER): Payer: Medicare Other | Admitting: Internal Medicine

## 2015-06-28 VITALS — BP 138/78 | HR 87 | Temp 98.4°F | Resp 16 | Wt 197.0 lb

## 2015-06-28 DIAGNOSIS — J189 Pneumonia, unspecified organism: Secondary | ICD-10-CM

## 2015-06-28 DIAGNOSIS — J8410 Pulmonary fibrosis, unspecified: Secondary | ICD-10-CM | POA: Diagnosis not present

## 2015-06-28 DIAGNOSIS — I82409 Acute embolism and thrombosis of unspecified deep veins of unspecified lower extremity: Secondary | ICD-10-CM | POA: Diagnosis not present

## 2015-06-28 LAB — POCT INR: INR: 2.1

## 2015-06-28 NOTE — Progress Notes (Signed)
I have reviewed and agree with the plan. 

## 2015-06-28 NOTE — Progress Notes (Signed)
Pre visit review using our clinic review tool, if applicable. No additional management support is needed unless otherwise documented below in the visit note. 

## 2015-06-29 ENCOUNTER — Ambulatory Visit (INDEPENDENT_AMBULATORY_CARE_PROVIDER_SITE_OTHER): Payer: Medicare Other | Admitting: Internal Medicine

## 2015-06-29 ENCOUNTER — Encounter: Payer: Self-pay | Admitting: Internal Medicine

## 2015-06-29 ENCOUNTER — Other Ambulatory Visit: Payer: Self-pay | Admitting: Internal Medicine

## 2015-06-29 VITALS — BP 116/74 | HR 87 | Ht 65.0 in | Wt 194.0 lb

## 2015-06-29 DIAGNOSIS — Z7901 Long term (current) use of anticoagulants: Secondary | ICD-10-CM | POA: Diagnosis not present

## 2015-06-29 DIAGNOSIS — R0600 Dyspnea, unspecified: Secondary | ICD-10-CM

## 2015-06-29 DIAGNOSIS — J8410 Pulmonary fibrosis, unspecified: Secondary | ICD-10-CM | POA: Diagnosis not present

## 2015-06-29 LAB — PULMONARY FUNCTION TEST
DL/VA % pred: 81 %
DL/VA: 3.46 ml/min/mmHg/L
DLCO cor % pred: 57 %
DLCO cor: 14.69 ml/min/mmHg
DLCO unc % pred: 58 %
DLCO unc: 14.89 ml/min/mmHg
FEF 25-75 Post: 2.09 L/sec
FEF 25-75 Pre: 1.99 L/sec
FEF2575-%Change-Post: 4 %
FEF2575-%Pred-Post: 96 %
FEF2575-%Pred-Pre: 91 %
FEV1-%Change-Post: 0 %
FEV1-%Pred-Post: 91 %
FEV1-%Pred-Pre: 90 %
FEV1-Post: 2.51 L
FEV1-Pre: 2.5 L
FEV1FVC-%Change-Post: 3 %
FEV1FVC-%Pred-Pre: 103 %
FEV6-%Change-Post: -3 %
FEV6-%Pred-Post: 89 %
FEV6-%Pred-Pre: 93 %
FEV6-Post: 3.13 L
FEV6-Pre: 3.26 L
FEV6FVC-%Pred-Post: 106 %
FEV6FVC-%Pred-Pre: 106 %
FVC-%Change-Post: -3 %
FVC-%Pred-Post: 84 %
FVC-%Pred-Pre: 87 %
FVC-Post: 3.16 L
FVC-Pre: 3.26 L
Post FEV1/FVC ratio: 80 %
Post FEV6/FVC ratio: 100 %
Pre FEV1/FVC ratio: 77 %
Pre FEV6/FVC Ratio: 100 %
RV % pred: 106 %
RV: 2.26 L
TLC % pred: 97 %
TLC: 5.86 L

## 2015-06-29 NOTE — Patient Instructions (Signed)
Please schedule a follow up office visit in 6 months, call sooner if needed (unless the problem is predominantly arthritis

## 2015-06-29 NOTE — Progress Notes (Signed)
PFT done today. 

## 2015-06-29 NOTE — Progress Notes (Signed)
Subjective:    Patient ID: [name], male    DOB: 1950-11-28,   MRN: [id number]    Brief patient profile:  51 yowm never smoker with longstanding deforming RA followed by Dr [name] referrred 05/11/2015 by Dr Ronnald Ramp for ILD     History of Present Illness  05/11/2015 [facility] office visit/ Tlaloc Taddei   Chief Complaint  Patient presents with  . Pulmonary Consult    Referred by Dr. [name]. Pt c/o SOB and cough for the past 3 months, worse x 1 month. He states that he is SOB with any amount of exertion. His cough is prod with clear to "really dark" sputum. He also c/o chills and sweats.   indolent onset progressive doe /coughing since late summer of 2016 but better since lasix but still sob walking aisles at HT/ hcp/ arthritis is worse since breathing worse - When started lasix was obviously having peirpheral edema but this has mostly resolved. rec Please see patient coordinator before you leave today  to schedule HRCT chest > c/w nsip     Admit date: 06/18/2015 Discharge date: 06/20/2015  Discharge Diagnoses:  Principal Problem:  Community acquired pneumonia Active Problems:  CAD (coronary artery disease) of artery bypass graft  Type II diabetes mellitus with manifestations (HCC)  HIV disease ([place])  Chronic lymphoblastic leukemia ([place])  Hypertension  DVT, lower extremity, recurrent ([place])  Pneumonia  Dyspnea   06/29/2015  f/u ov/Reneta Niehaus re: NSIP presumably due to RA lung dz/ poor control of arthritis chronically / s/p ? CAP  Chief Complaint  Patient presents with  . Follow-up    PFT done today. Pt hospitalized from 06/18/15-06/20/15 with PNA. He feels his breathing has improved since d/c. Cough is prod wit minimal, thick, dark yellow sputum.    mucous now has turned clear p finished abx /min volume in ams  / activity tol back near baseline s resp meds   No obvious day to day or daytime variability or assoc cp or chest tightness, subjective wheeze or overt  sinus or hb symptoms. No unusual exp hx or h/o childhood pna/ asthma or knowledge of premature birth.  Sleeping ok without nocturnal  or early am exacerbation  of respiratory  c/o's or need for noct saba. Also denies any obvious fluctuation of symptoms with weather or environmental changes or other aggravating or alleviating factors except as outlined above   Current Medications, Allergies, Complete Past Medical History, Past Surgical History, Family History, and Social History were reviewed in [employer] record.  ROS  The following are not active complaints unless bolded sore throat, dysphagia, dental problems, itching, sneezing,  nasal congestion or excess/ purulent secretions, ear ache,   fever, chills, sweats, unintended wt loss, classically pleuritic or exertional cp, hemoptysis,  orthopnea pnd or leg swelling, presyncope, palpitations, abdominal pain, anorexia, nausea, vomiting, diarrhea  or change in bowel or bladder habits, change in stools or urine, dysuria,hematuria,  rash, arthralgias, visual complaints, headache, numbness, weakness or ataxia or problems with walking or coordination,  change in mood/affect or memory.           Objective:   Physical Exam  amb wm nad    06/29/2015       194   05/11/15 192 lb (87.091 kg)  04/26/15 196 lb 12 oz (89.245 kg)  04/19/15 179 lb 3 oz (81.279 kg)    Vital signs reviewed   HEENT: nl dentition, turbinates, and oropharynx. Nl external ear canals without  cough reflex   NECK :  without JVD/Nodes/TM/ nl carotid upstrokes bilaterally   LUNGS: no acc muscle use,  Nl contour chest/ mod  insp crackles bilaterally without cough on insp or exp maneuvers   CV:  RRR  no s3 or murmur or increase in P2, trace pitting bilatereal lower ext  edema   ABD:  soft and nontender with nl inspiratory excursion in the supine position. No bruits or organomegaly, bowel sounds nl  MS:  Nl gait/ ext warm with classic ra changes both  hands/ no calf tenderness, cyanosis or clubbing No obvious joint restrictions   SKIN: warm and dry without lesions    NEURO:  alert, approp, nl sensorium with  no motor deficits       I personally reviewed images and agree with radiology impression as follows:  CT Chest    06/19/15 1. Progressive patchy multilobar opacities in both lungs, compatible with progressive infection/inflammation. 2. Mildly improved mediastinal adenopathy, stable right hilar adenopathy and significantly improved bilateral axillary adenopathy.    Assessment & Plan:

## 2015-06-30 ENCOUNTER — Encounter: Payer: Self-pay | Admitting: Internal Medicine

## 2015-06-30 NOTE — Assessment & Plan Note (Signed)
PFTs  08/31/14  VC 3.75 (93%) no obst  DLCO 63 corrects to 94 - 05/11/2015   Walked RA x one lap @ 185 stopped due to  Fatigue, sats still 96% no sob  HRCT 05/11/2015   >  New extensive patchy regions of reticulation and ground-glass attenuation throughout the peribronchovascular and peripheral lungs bilaterally, most prominent in the mid to lower lungs with relative sparing of the lung apices. No significant regions of traction bronchiectasis or frank honeycombing. Favor nonspecific interstitial pneumonia (NSIP). 2. New prominent bilateral axillary, mediastinal and bilateral hilar lymphadenopathy, nonspecific. If there are clinical/laboratory findings suggestive of a lymphoproliferative disorder, consider tissue sampling. Otherwise, recommend follow-up chest CT with IV contrast in 3 months. PFT's  06/29/2015  VC 3.60 (96%)  with DLCO  58/57c % corrects to 81 % for alv volume     I had an extended discussion with the patient reviewing all relevant studies completed to date and  lasting 15 to 20 minutes of a 25 minute visit on the following ongoing concerns:   1) not really clear how much of his ct change was cap/ali vs NSIP flare but issue is moot now as no signs of ongoing infections and symptoms and pfts back to baseline   2) control of arthritis/connective tissue dz is the key to controlling NSIP, not dedicated pulmonary f/u or meds  3) Each maintenance medication was reviewed in detail including most importantly the difference between maintenance and as needed and under what circumstances the prns are to be used.  Please see instructions for details which were reviewed in writing and the patient given a copy.

## 2015-06-30 NOTE — Progress Notes (Signed)
Subjective:  Patient ID: [name], male    DOB: Dec 01, 1950  Age: 65 y.o. MRN: [id number]  CC: Cough   HPI EPIGMENIO KOLTON presents for hosp f/up after a recent admission for PNA complicated by pulm fibrosis, he tells me that he feels "300%" better with a rare cough that is productive of thin/tan phlegm, he remains on Levaquin and prednisone.  Outpatient Prescriptions Prior to Visit  Medication Sig Dispense Refill  . aspirin EC 81 MG tablet Take 81 mg by mouth daily.    [name] atorvastatin (LIPITOR) 10 MG tablet Take 1 tablet (10 mg total) by mouth at bedtime. (Patient taking differently: Take 10 mg by mouth every morning. ) 90 tablet 3  . azaTHIOprine (IMURAN) 50 MG tablet Take 50 mg by mouth 2 (two) times daily.  3  . EPINEPHrine (EPIPEN 2-PAK) 0.3 mg/0.3 mL IJ SOAJ injection Inject 0.3 mLs (0.3 mg total) into the muscle once. 2 Device 0  . furosemide (LASIX) 20 MG tablet Take 1 tablet (20 mg total) by mouth 2 (two) times daily. (Patient taking differently: Take 20 mg by mouth every evening. ) 60 tablet 5  . GENVOYA 150-150-200-10 MG TABS tablet TAKE 1 TABLET BY MOUTH DAILY WITH BREAKFAST. 30 tablet 5  . ibuprofen (ADVIL,MOTRIN) 200 MG tablet Take 200 mg by mouth every 6 (six) hours as needed for headache.    . inFLIXimab (REMICADE) 100 MG injection Inject into the vein every 6 (six) weeks.     [name] levofloxacin (LEVAQUIN) 750 MG tablet Take 1 tablet (750 mg total) by mouth daily. 20 tablet 0  . loratadine (CLARITIN) 10 MG tablet Take 1 tablet (10 mg total) by mouth daily. 90 tablet 3  . LYRICA 200 MG capsule TAKE 1 CAPSULE BY MOUTH TWICE DAILY 60 capsule 5  . metoprolol succinate (TOPROL-XL) 25 MG 24 hr tablet Take 1 tablet (25 mg total) by mouth at bedtime. 30 tablet 11  . pantoprazole (PROTONIX) 40 MG tablet TAKE 1 TABLET EVERY DAY 60 tablet 0  . predniSONE (DELTASONE) 20 MG tablet Take 2 tablets (40 mg total) by mouth daily with breakfast. 14 tablet 0  . PREZISTA 800 MG tablet TAKE 1  TABLET (800 MG TOTAL) BY MOUTH DAILY. 30 tablet 5  . saccharomyces boulardii (FLORASTOR) 250 MG capsule Take 1 capsule (250 mg total) by mouth 2 (two) times daily. 60 capsule 1  . urea (CARMOL) 10 % cream APPLY TOPICALLY AS NEEDED. (Patient taking differently: APPLY TOPICALLY AS NEEDED -APPLIED TO BUTTOCKS) 85 g 0  . valACYclovir (VALTREX) 500 MG tablet TAKE 1 TABLET BY MOUTH EVERY DAY 90 tablet 3  . warfarin (COUMADIN) 10 MG tablet TAKE 1/2 ON WEDNESDAY AND 1 TABLET ALL OTHER DAYS (Patient taking differently: TAKE 10MG  ON WEDNESDAYS AND SATURDAYS, THEN TAKE 5MG  ON ALL OTHER DAYS) 60 tablet 3  . zolpidem (AMBIEN) 5 MG tablet TAKE 1 TABLET BY MOUTH AT BEDTIME AS NEEDED FOR SLEEP 30 tablet 2  . predniSONE (DELTASONE) 10 MG tablet Take 1 tablet (10 mg total) by mouth daily with breakfast. Take 6 tablets today and then decrease by 1 tablet daily until none are left. 21 tablet 0  . escitalopram (LEXAPRO) 10 MG tablet Take 1 tablet (10 mg total) by mouth daily. 90 tablet 1   No facility-administered medications prior to visit.    ROS Review of Systems  Constitutional: Negative for fever, chills, diaphoresis, activity change, appetite change, fatigue and unexpected weight change.  HENT: Negative.  Negative for facial swelling, sinus pressure, sore throat, trouble swallowing and voice change.   Eyes: Negative.   Respiratory: Positive for cough. Negative for apnea, choking, chest tightness, shortness of breath, wheezing and stridor.   Cardiovascular: Negative.  Negative for chest pain, palpitations and leg swelling.  Gastrointestinal: Negative.  Negative for nausea, vomiting, abdominal pain, diarrhea and constipation.  Endocrine: Negative.   Genitourinary: Negative.   Musculoskeletal: Negative.  Negative for myalgias, back pain and arthralgias.  Skin: Negative.  Negative for color change and rash.  Allergic/Immunologic: Positive for immunocompromised state.  Neurological: Negative.   Hematological:  Negative.  Negative for adenopathy. Does not bruise/bleed easily.  Psychiatric/Behavioral: Negative.     Objective:  BP 138/78 mmHg  Pulse 87  Temp(Src) 98.4 F (36.9 C) (Oral)  Resp 16  Wt 197 lb (89.359 kg)  SpO2 94%  BP Readings from Last 3 Encounters:  06/29/15 116/74  06/28/15 138/78  06/20/15 136/76    Wt Readings from Last 3 Encounters:  06/29/15 194 lb (87.998 kg)  06/28/15 197 lb (89.359 kg)  06/18/15 190 lb 14.4 oz (86.592 kg)    Physical Exam  Constitutional: He is oriented to person, place, and time.  Non-toxic appearance. He does not have a sickly appearance. He does not appear ill. No distress.  HENT:  Mouth/Throat: Oropharynx is clear and moist. No oropharyngeal exudate.  Eyes: Conjunctivae are normal. Right eye exhibits no discharge. Left eye exhibits no discharge. No scleral icterus.  Neck: Normal range of motion. Neck supple. No JVD present. No tracheal deviation present. No thyromegaly present.  Cardiovascular: Normal rate, regular rhythm, normal heart sounds and intact distal pulses.  Exam reveals no gallop and no friction rub.   No murmur heard. Pulmonary/Chest: Effort normal and breath sounds normal. No stridor. No respiratory distress. He has no wheezes. He has no rales. He exhibits no tenderness.  Abdominal: Soft. Bowel sounds are normal. He exhibits no distension and no mass. There is no tenderness. There is no rebound and no guarding.  Musculoskeletal: Normal range of motion. He exhibits no edema or tenderness.  Lymphadenopathy:    He has no cervical adenopathy.  Neurological: He is oriented to person, place, and time.  Skin: Skin is warm and dry. No rash noted. He is not diaphoretic. No erythema. No pallor.  Vitals reviewed.   Lab Results  Component Value Date   WBC 15.1* 06/20/2015   HGB 12.9* 06/20/2015   HCT 39.7 06/20/2015   PLT 115* 06/20/2015   GLUCOSE 104* 06/20/2015   CHOL 132 11/20/2014   TRIG 101 11/20/2014   HDL 30* 11/20/2014     LDLCALC 82 11/20/2014   ALT 25 06/18/2015   AST 30 06/18/2015   NA 140 06/20/2015   K 3.7 06/20/2015   CL 106 06/20/2015   CREATININE 0.82 06/20/2015   BUN 16 06/20/2015   CO2 27 06/20/2015   TSH 0.60 07/11/2014   INR 2.1 06/28/2015   HGBA1C 4.7 09/05/2014    Dg Chest 2 View  06/18/2015  CLINICAL DATA:  Body aches with fever with for 4 days. Cough. Multiple prior surgeries. EXAM: CHEST  2 VIEW COMPARISON:  CT 05/11/2015.  Radiographs 04/26/2015. FINDINGS: The heart size and mediastinal contours are stable status post median sternotomy and CABG. Patchy left-greater-than-right ground-glass opacities are not significantly changed from the recent prior studies. There is no confluent airspace opacity, significant pleural effusion or pneumothorax. Right lung granuloma noted. There are postsurgical changes at the right humeral head and  in the lumbar spine. IMPRESSION: Stable bilateral ground-glass opacities as evaluated on CT 5 weeks ago. No new findings identified. Electronically Signed   By: Richardean Sale M.D.   On: 06/18/2015 12:06   Ct Chest W Contrast  06/19/2015  CLINICAL DATA:  Dyspnea. Mildly productive cough with dark foci in the sputum. HIV positive. EXAM: CT CHEST WITH CONTRAST TECHNIQUE: Multidetector CT imaging of the chest was performed during intravenous contrast administration. CONTRAST:  1mL OMNIPAQUE IOHEXOL 300 MG/ML  SOLN COMPARISON:  Chest radiographs dated 06/18/2015 and high-resolution chest CT dated 05/11/2015. FINDINGS: Mediastinum/Lymph Nodes: Multiple enlarged mediastinal lymph nodes are slightly smaller. These include an enlarged left superior mediastinal node with a short axis diameter of 13 mm on image number 12 of series 2, previously 20 mm. A previous right peritracheal node with a short axis diameter of 19 mm has a short axis diameter of 18 mm on image number 24 of series 2. A previously demonstrated 22 mm short axis subcarinal node continues to have a short axis  diameter of 22 mm on corresponding image number 30 of series 2. An enlarged right hilar node currently has a short axis diameter of 19 mm on image number 36 of series 2, unchanged. A previously demonstrated 12 mm short axis left axillary node has a short axis diameter of 7 mm today on image number 20 of series 2. A previously demonstrated 9 mm short axis right axillary node has a short axis diameter of 5 mm today. Multiple of the enlarged lymph nodes contain punctate calcifications, unchanged. Atheromatous coronary artery calcifications are noted as well as post CABG changes. A small to moderate-sized hiatal hernia is again demonstrated. Lungs/Pleura: Progressive patchy opacity in both lungs including all lobes, most pronounced in the lower lobes. No nodules are visualized. No pleural fluid. Upper abdomen: Multiple calcified granulomata in the spleen. Cholecystectomy clips. No enlarged lymph nodes seen. Musculoskeletal: Thoracic and lower cervical spine degenerative changes, including changes of DISH. IMPRESSION: 1. Progressive patchy multilobar opacities in both lungs, compatible with progressive infection/inflammation. 2. Mildly improved mediastinal adenopathy, stable right hilar adenopathy and significantly improved bilateral axillary adenopathy. Electronically Signed   By: Claudie Revering M.D.   On: 06/19/2015 15:00    Assessment & Plan:   Denney was seen today for cough.  Diagnoses and all orders for this visit:  Community acquired pneumonia- improvement noted, will recheck his CT scan in about 6 weeks, he will complete the course of levaquin  Postinflammatory pulmonary fibrosis ([place])- improvement seen on steroids and azathioprine, he will see pulm tomorrow  I am having Mr. Meanor maintain his aspirin EC, inFLIXimab, escitalopram, EPINEPHrine, pantoprazole, warfarin, atorvastatin, metoprolol succinate, GENVOYA, PREZISTA, LYRICA, saccharomyces boulardii, loratadine, valACYclovir, urea, furosemide,  zolpidem, azaTHIOprine, ibuprofen, predniSONE, and levofloxacin.  No orders of the defined types were placed in this encounter.     Follow-up: No Follow-up on file.  [name], MD

## 2015-06-30 NOTE — Patient Instructions (Signed)

## 2015-07-02 ENCOUNTER — Encounter (HOSPITAL_COMMUNITY): Payer: Self-pay | Admitting: *Deleted

## 2015-07-02 ENCOUNTER — Emergency Department (HOSPITAL_COMMUNITY): Payer: Medicare Other

## 2015-07-02 ENCOUNTER — Inpatient Hospital Stay (HOSPITAL_COMMUNITY)
Admission: EM | Admit: 2015-07-02 | Discharge: 2015-07-13 | DRG: 975 | Disposition: A | Discharge status: Home / Self Care | Payer: Medicare Other | Attending: Internal Medicine | Admitting: Internal Medicine

## 2015-07-02 DIAGNOSIS — Z96653 Presence of artificial knee joint, bilateral: Secondary | ICD-10-CM | POA: Diagnosis present

## 2015-07-02 DIAGNOSIS — G8929 Other chronic pain: Secondary | ICD-10-CM | POA: Diagnosis present

## 2015-07-02 DIAGNOSIS — Z951 Presence of aortocoronary bypass graft: Secondary | ICD-10-CM | POA: Diagnosis not present

## 2015-07-02 DIAGNOSIS — Z91010 Allergy to peanuts: Secondary | ICD-10-CM

## 2015-07-02 DIAGNOSIS — R509 Fever, unspecified: Secondary | ICD-10-CM | POA: Diagnosis not present

## 2015-07-02 DIAGNOSIS — A419 Sepsis, unspecified organism: Secondary | ICD-10-CM | POA: Diagnosis not present

## 2015-07-02 DIAGNOSIS — Z7952 Long term (current) use of systemic steroids: Secondary | ICD-10-CM | POA: Diagnosis not present

## 2015-07-02 DIAGNOSIS — B20 Human immunodeficiency virus [HIV] disease: Secondary | ICD-10-CM | POA: Diagnosis not present

## 2015-07-02 DIAGNOSIS — E119 Type 2 diabetes mellitus without complications: Secondary | ICD-10-CM | POA: Diagnosis present

## 2015-07-02 DIAGNOSIS — Z8673 Personal history of transient ischemic attack (TIA), and cerebral infarction without residual deficits: Secondary | ICD-10-CM

## 2015-07-02 DIAGNOSIS — Z7982 Long term (current) use of aspirin: Secondary | ICD-10-CM

## 2015-07-02 DIAGNOSIS — I252 Old myocardial infarction: Secondary | ICD-10-CM

## 2015-07-02 DIAGNOSIS — C91Z0 Other lymphoid leukemia not having achieved remission: Secondary | ICD-10-CM | POA: Diagnosis not present

## 2015-07-02 DIAGNOSIS — J90 Pleural effusion, not elsewhere classified: Secondary | ICD-10-CM | POA: Diagnosis not present

## 2015-07-02 DIAGNOSIS — K210 Gastro-esophageal reflux disease with esophagitis: Secondary | ICD-10-CM | POA: Diagnosis present

## 2015-07-02 DIAGNOSIS — I10 Essential (primary) hypertension: Secondary | ICD-10-CM | POA: Diagnosis present

## 2015-07-02 DIAGNOSIS — R51 Headache: Secondary | ICD-10-CM | POA: Diagnosis not present

## 2015-07-02 DIAGNOSIS — E860 Dehydration: Secondary | ICD-10-CM | POA: Diagnosis present

## 2015-07-02 DIAGNOSIS — C9192 Lymphoid leukemia, unspecified, in relapse: Secondary | ICD-10-CM | POA: Diagnosis present

## 2015-07-02 DIAGNOSIS — Z21 Asymptomatic human immunodeficiency virus [HIV] infection status: Secondary | ICD-10-CM | POA: Diagnosis present

## 2015-07-02 DIAGNOSIS — M05731 Rheumatoid arthritis with rheumatoid factor of right wrist without organ or systems involvement: Secondary | ICD-10-CM | POA: Diagnosis not present

## 2015-07-02 DIAGNOSIS — I2510 Atherosclerotic heart disease of native coronary artery without angina pectoris: Secondary | ICD-10-CM | POA: Diagnosis present

## 2015-07-02 DIAGNOSIS — IMO0001 Reserved for inherently not codable concepts without codable children: Secondary | ICD-10-CM | POA: Diagnosis present

## 2015-07-02 DIAGNOSIS — Z7901 Long term (current) use of anticoagulants: Secondary | ICD-10-CM | POA: Diagnosis not present

## 2015-07-02 DIAGNOSIS — J8410 Pulmonary fibrosis, unspecified: Secondary | ICD-10-CM | POA: Diagnosis present

## 2015-07-02 DIAGNOSIS — C9111 Chronic lymphocytic leukemia of B-cell type in remission: Secondary | ICD-10-CM | POA: Diagnosis present

## 2015-07-02 DIAGNOSIS — L89151 Pressure ulcer of sacral region, stage 1: Secondary | ICD-10-CM | POA: Diagnosis not present

## 2015-07-02 DIAGNOSIS — R112 Nausea with vomiting, unspecified: Secondary | ICD-10-CM | POA: Diagnosis not present

## 2015-07-02 DIAGNOSIS — C9112 Chronic lymphocytic leukemia of B-cell type in relapse: Secondary | ICD-10-CM | POA: Diagnosis present

## 2015-07-02 DIAGNOSIS — K5730 Diverticulosis of large intestine without perforation or abscess without bleeding: Secondary | ICD-10-CM | POA: Diagnosis not present

## 2015-07-02 DIAGNOSIS — M549 Dorsalgia, unspecified: Secondary | ICD-10-CM | POA: Diagnosis present

## 2015-07-02 DIAGNOSIS — M05729 Rheumatoid arthritis with rheumatoid factor of unspecified elbow without organ or systems involvement: Secondary | ICD-10-CM | POA: Diagnosis not present

## 2015-07-02 DIAGNOSIS — Y95 Nosocomial condition: Secondary | ICD-10-CM | POA: Diagnosis present

## 2015-07-02 DIAGNOSIS — M05732 Rheumatoid arthritis with rheumatoid factor of left wrist without organ or systems involvement: Secondary | ICD-10-CM | POA: Diagnosis not present

## 2015-07-02 DIAGNOSIS — L8990 Pressure ulcer of unspecified site, unspecified stage: Secondary | ICD-10-CM | POA: Insufficient documentation

## 2015-07-02 DIAGNOSIS — M797 Fibromyalgia: Secondary | ICD-10-CM | POA: Diagnosis present

## 2015-07-02 DIAGNOSIS — J189 Pneumonia, unspecified organism: Principal | ICD-10-CM | POA: Diagnosis present

## 2015-07-02 DIAGNOSIS — M069 Rheumatoid arthritis, unspecified: Secondary | ICD-10-CM | POA: Diagnosis present

## 2015-07-02 LAB — INFLUENZA PANEL BY PCR (TYPE A & B)
H1N1 flu by pcr: NOT DETECTED
Influenza A By PCR: NEGATIVE
Influenza B By PCR: NEGATIVE

## 2015-07-02 LAB — COMPREHENSIVE METABOLIC PANEL
ALT: 35 U/L (ref 17–63)
AST: 41 U/L (ref 15–41)
Albumin: 3.1 g/dL — ABNORMAL LOW (ref 3.5–5.0)
Alkaline Phosphatase: 60 U/L (ref 38–126)
Anion gap: 8 (ref 5–15)
BUN: 28 mg/dL — ABNORMAL HIGH (ref 6–20)
CO2: 25 mmol/L (ref 22–32)
Calcium: 7.8 mg/dL — ABNORMAL LOW (ref 8.9–10.3)
Chloride: 100 mmol/L — ABNORMAL LOW (ref 101–111)
Creatinine, Ser: 1.21 mg/dL (ref 0.61–1.24)
GFR calc Af Amer: >60 mL/min (ref >60)
GFR calc non Af Amer: >60 mL/min (ref >60)
Glucose, Bld: 110 mg/dL — ABNORMAL HIGH (ref 65–99)
Potassium: 4 mmol/L (ref 3.5–5.1)
Sodium: 133 mmol/L — ABNORMAL LOW (ref 135–145)
Total Bilirubin: 1 mg/dL (ref 0.3–1.2)
Total Protein: 5.4 g/dL — ABNORMAL LOW (ref 6.5–8.1)

## 2015-07-02 LAB — CBC WITH DIFFERENTIAL/PLATELET
Band Neutrophils: 0 %
Basophils Absolute: 0 10*3/uL (ref 0.0–0.1)
Basophils Relative: 0 %
Blasts: 0 %
Eosinophils Absolute: 0 10*3/uL (ref 0.0–0.7)
Eosinophils Relative: 0 %
HCT: 45.6 % (ref 39.0–52.0)
Hemoglobin: 15.3 g/dL (ref 13.0–17.0)
Lymphocytes Relative: 88 %
Lymphs Abs: 18.5 10*3/uL — ABNORMAL HIGH (ref 0.7–4.0)
MCH: 29.4 pg (ref 26.0–34.0)
MCHC: 33.6 g/dL (ref 30.0–36.0)
MCV: 87.5 fL (ref 78.0–100.0)
Metamyelocytes Relative: 0 %
Monocytes Absolute: 0 10*3/uL — ABNORMAL LOW (ref 0.1–1.0)
Monocytes Relative: 0 %
Myelocytes: 0 %
Neutro Abs: 2.5 10*3/uL (ref 1.7–7.7)
Neutrophils Relative %: 12 %
Platelets: 155 10*3/uL (ref 150–400)
Promyelocytes Absolute: 0 %
RBC: 5.21 MIL/uL (ref 4.22–5.81)
RDW: 19.2 % — ABNORMAL HIGH (ref 11.5–15.5)
WBC: 21 10*3/uL — ABNORMAL HIGH (ref 4.0–10.5)
nRBC: 0 /100 WBC

## 2015-07-02 LAB — BASIC METABOLIC PANEL
Anion gap: 4 — ABNORMAL LOW (ref 5–15)
BUN: 23 mg/dL — ABNORMAL HIGH (ref 6–20)
CO2: 25 mmol/L (ref 22–32)
Calcium: 6.7 mg/dL — ABNORMAL LOW (ref 8.9–10.3)
Chloride: 108 mmol/L (ref 101–111)
Creatinine, Ser: 1.07 mg/dL (ref 0.61–1.24)
GFR calc Af Amer: >60 mL/min (ref >60)
GFR calc non Af Amer: >60 mL/min (ref >60)
Glucose, Bld: 106 mg/dL — ABNORMAL HIGH (ref 65–99)
Potassium: 3.7 mmol/L (ref 3.5–5.1)
Sodium: 137 mmol/L (ref 135–145)

## 2015-07-02 LAB — URINALYSIS, ROUTINE W REFLEX MICROSCOPIC
Bilirubin Urine: NEGATIVE
Glucose, UA: NEGATIVE mg/dL
Hgb urine dipstick: NEGATIVE
Ketones, ur: NEGATIVE mg/dL
Leukocytes, UA: NEGATIVE
Nitrite: NEGATIVE
Protein, ur: NEGATIVE mg/dL
Specific Gravity, Urine: <1.005 — ABNORMAL LOW (ref 1.005–1.030)
pH: 5.5 (ref 5.0–8.0)

## 2015-07-02 LAB — PROTIME-INR
INR: 2.13 — ABNORMAL HIGH (ref 0.00–1.49)
Prothrombin Time: 23.6 seconds — ABNORMAL HIGH (ref 11.6–15.2)

## 2015-07-02 LAB — I-STAT CG4 LACTIC ACID, ED
Lactic Acid, Venous: 1.64 mmol/L (ref 0.5–2.0)
Lactic Acid, Venous: 0.62 mmol/L (ref 0.5–2.0)

## 2015-07-02 MED ORDER — SODIUM CHLORIDE 0.9 % IV SOLN
250.0000 mL | INTRAVENOUS | Status: DC | PRN
  Administered 2015-07-04: 250 mL via INTRAVENOUS

## 2015-07-02 MED ORDER — VANCOMYCIN HCL 10 G IV SOLR
1500.0000 mg | Freq: Once | INTRAVENOUS | Status: AC
  Administered 2015-07-02: 1500 mg via INTRAVENOUS
  Filled 2015-07-02: qty 1500

## 2015-07-02 MED ORDER — ONDANSETRON HCL 4 MG/2ML IJ SOLN
4.0000 mg | Freq: Four times a day (QID) | INTRAMUSCULAR | Status: DC | PRN
  Administered 2015-07-06 – 2015-07-09 (×4): 4 mg via INTRAVENOUS
  Filled 2015-07-02 (×4): qty 2

## 2015-07-02 MED ORDER — ASPIRIN EC 81 MG PO TBEC
81.0000 mg | DELAYED_RELEASE_TABLET | Freq: Every day | ORAL | Status: DC
  Administered 2015-07-02 – 2015-07-13 (×12): 81 mg via ORAL
  Filled 2015-07-02 (×12): qty 1

## 2015-07-02 MED ORDER — POLYETHYLENE GLYCOL 3350 17 G PO PACK: 17.0000 g | PACK | Freq: Every day | ORAL | Status: DC | PRN

## 2015-07-02 MED ORDER — DARUNAVIR ETHANOLATE 800 MG PO TABS
800.0000 mg | ORAL_TABLET | Freq: Every day | ORAL | Status: DC
  Administered 2015-07-03 – 2015-07-13 (×11): 800 mg via ORAL
  Filled 2015-07-02 (×12): qty 1

## 2015-07-02 MED ORDER — VANCOMYCIN HCL IN DEXTROSE 1-5 GM/200ML-% IV SOLN
1000.0000 mg | Freq: Two times a day (BID) | INTRAVENOUS | Status: DC
  Administered 2015-07-03 – 2015-07-07 (×9): 1000 mg via INTRAVENOUS
  Filled 2015-07-02 (×7): qty 200

## 2015-07-02 MED ORDER — SODIUM CHLORIDE 0.9% FLUSH
3.0000 mL | Freq: Two times a day (BID) | INTRAVENOUS | Status: DC
  Administered 2015-07-02 – 2015-07-13 (×22): 3 mL via INTRAVENOUS

## 2015-07-02 MED ORDER — ELVITEG-COBIC-EMTRICIT-TENOFAF 150-150-200-10 MG PO TABS
1.0000 | ORAL_TABLET | Freq: Every day | ORAL | Status: DC
  Administered 2015-07-03 – 2015-07-13 (×11): 1 via ORAL
  Filled 2015-07-02 (×13): qty 1

## 2015-07-02 MED ORDER — LORATADINE 10 MG PO TABS
10.0000 mg | ORAL_TABLET | Freq: Every day | ORAL | Status: DC
  Administered 2015-07-03 – 2015-07-13 (×11): 10 mg via ORAL
  Filled 2015-07-02 (×11): qty 1

## 2015-07-02 MED ORDER — ESCITALOPRAM OXALATE 10 MG PO TABS
10.0000 mg | ORAL_TABLET | Freq: Every day | ORAL | Status: DC
  Administered 2015-07-03 – 2015-07-13 (×11): 10 mg via ORAL
  Filled 2015-07-02 (×11): qty 1

## 2015-07-02 MED ORDER — SACCHAROMYCES BOULARDII 250 MG PO CAPS
250.0000 mg | ORAL_CAPSULE | Freq: Two times a day (BID) | ORAL | Status: DC
  Administered 2015-07-02 – 2015-07-13 (×22): 250 mg via ORAL
  Filled 2015-07-02 (×23): qty 1

## 2015-07-02 MED ORDER — SODIUM CHLORIDE 0.9% FLUSH
3.0000 mL | Freq: Two times a day (BID) | INTRAVENOUS | Status: DC
  Administered 2015-07-03 – 2015-07-10 (×11): 3 mL via INTRAVENOUS

## 2015-07-02 MED ORDER — PANTOPRAZOLE SODIUM 40 MG PO TBEC
40.0000 mg | DELAYED_RELEASE_TABLET | Freq: Every day | ORAL | Status: DC
  Administered 2015-07-03 – 2015-07-13 (×11): 40 mg via ORAL
  Filled 2015-07-02 (×11): qty 1

## 2015-07-02 MED ORDER — PREGABALIN 100 MG PO CAPS
200.0000 mg | ORAL_CAPSULE | Freq: Two times a day (BID) | ORAL | Status: DC
  Administered 2015-07-02 – 2015-07-13 (×22): 200 mg via ORAL
  Filled 2015-07-02 (×4): qty 1
  Filled 2015-07-02: qty 2
  Filled 2015-07-02 (×2): qty 1
  Filled 2015-07-02: qty 2
  Filled 2015-07-02: qty 1
  Filled 2015-07-02: qty 2
  Filled 2015-07-02 (×2): qty 1
  Filled 2015-07-02: qty 2
  Filled 2015-07-02: qty 1
  Filled 2015-07-02: qty 2
  Filled 2015-07-02 (×2): qty 1
  Filled 2015-07-02: qty 2
  Filled 2015-07-02 (×5): qty 1

## 2015-07-02 MED ORDER — VANCOMYCIN HCL IN DEXTROSE 1-5 GM/200ML-% IV SOLN
1000.0000 mg | Freq: Once | INTRAVENOUS | Status: DC
Start: 2015-07-02 — End: 2015-07-02

## 2015-07-02 MED ORDER — SODIUM CHLORIDE 0.9 % IV BOLUS (SEPSIS)
1000.0000 mL | INTRAVENOUS | Status: AC
  Administered 2015-07-02 (×3): 1000 mL via INTRAVENOUS

## 2015-07-02 MED ORDER — VALACYCLOVIR HCL 500 MG PO TABS
500.0000 mg | ORAL_TABLET | Freq: Every day | ORAL | Status: DC
  Administered 2015-07-03 – 2015-07-13 (×11): 500 mg via ORAL
  Filled 2015-07-02 (×12): qty 1

## 2015-07-02 MED ORDER — DEXTROSE 5 % IV SOLN
2.0000 g | Freq: Once | INTRAVENOUS | Status: AC
  Administered 2015-07-02: 2 g via INTRAVENOUS
  Filled 2015-07-02: qty 2

## 2015-07-02 MED ORDER — SODIUM CHLORIDE 0.9% FLUSH
3.0000 mL | INTRAVENOUS | Status: DC | PRN
  Administered 2015-07-09: 3 mL via INTRAVENOUS
  Filled 2015-07-02: qty 3

## 2015-07-02 MED ORDER — ONDANSETRON HCL 4 MG PO TABS: 4.0000 mg | ORAL_TABLET | Freq: Four times a day (QID) | ORAL | Status: DC | PRN

## 2015-07-02 MED ORDER — ZOLPIDEM TARTRATE 5 MG PO TABS
5.0000 mg | ORAL_TABLET | Freq: Every evening | ORAL | Status: DC | PRN
  Administered 2015-07-03 – 2015-07-12 (×8): 5 mg via ORAL
  Filled 2015-07-02 (×8): qty 1

## 2015-07-02 MED ORDER — OSELTAMIVIR PHOSPHATE 75 MG PO CAPS
75.0000 mg | ORAL_CAPSULE | Freq: Two times a day (BID) | ORAL | Status: DC
  Administered 2015-07-02 – 2015-07-04 (×5): 75 mg via ORAL
  Filled 2015-07-02 (×6): qty 1

## 2015-07-02 MED ORDER — AZATHIOPRINE 50 MG PO TABS
50.0000 mg | ORAL_TABLET | Freq: Two times a day (BID) | ORAL | Status: DC
  Administered 2015-07-02 – 2015-07-13 (×22): 50 mg via ORAL
  Filled 2015-07-02 (×24): qty 1

## 2015-07-02 MED ORDER — AZTREONAM 1 G IJ SOLR
1.0000 g | Freq: Three times a day (TID) | INTRAMUSCULAR | Status: DC
  Administered 2015-07-02 – 2015-07-08 (×17): 1 g via INTRAVENOUS
  Filled 2015-07-02 (×21): qty 1

## 2015-07-02 MED ORDER — IBUPROFEN 400 MG PO TABS
200.0000 mg | ORAL_TABLET | Freq: Four times a day (QID) | ORAL | Status: DC | PRN
  Administered 2015-07-03 (×2): 200 mg via ORAL
  Filled 2015-07-02 (×2): qty 1

## 2015-07-02 MED ORDER — ACETAMINOPHEN 325 MG PO TABS
650.0000 mg | ORAL_TABLET | Freq: Once | ORAL | Status: AC | PRN
  Administered 2015-07-02: 650 mg via ORAL
  Filled 2015-07-02: qty 2

## 2015-07-02 MED ORDER — ATORVASTATIN CALCIUM 10 MG PO TABS
10.0000 mg | ORAL_TABLET | Freq: Every day | ORAL | Status: DC
  Administered 2015-07-03 – 2015-07-12 (×10): 10 mg via ORAL
  Filled 2015-07-02 (×10): qty 1

## 2015-07-02 MED ORDER — METOPROLOL SUCCINATE ER 25 MG PO TB24
25.0000 mg | ORAL_TABLET | Freq: Every day | ORAL | Status: DC
  Administered 2015-07-02 – 2015-07-12 (×11): 25 mg via ORAL
  Filled 2015-07-02 (×12): qty 1

## 2015-07-02 NOTE — H&P (Signed)
History and Physical  [name] [id number] DOB: 06-Jul-1950 DOA: 07/02/2015  Referring physician: Dr. Christy [name] in ED PCP: Christopher Calico, MD   Chief Complaint: fever  HPI:  70yom PMH HIV, RA on methotrexate, recently hospitalized for multilobar pneumonia and discharged on Levaquin and prednisone who presented with three-day history of fever, myalgias, generalized weakness. Lactic acid was within normal limits, basic metabolic panel showed mild dehydration, chest x-ray and urinalysis were unremarkable. He was treated empirically with antibiotics and referred for admission.   Patient has been compliant with Levaquin and prednisone. He completed a prednisone but still has a few days left of Levaquin. He was seen by PCP 2/16, at that time "300%" better. symptoms began 2/18 with fever, muscle aches, anorexia and generalized weakness. Symptoms progressed throughout the weekend with associated poor food intake although he has been drinking fluids. He's had difficulty performing usual activities secondary to fatigue and fell this morning after letting the dog out. He's been taking ibuprofen for fever and headache which has improved but not resolved things. No neck pain. Currently has a headache of 2/10 she feels to be minor. Breathing is okay. An episode of vomiting but no abdominal pain. No rash or other localizing signs or symptoms. He did have influenza vaccine this season.   In the emergency department temp 102.6, HR 121, RR 32 >> now normal. BP 110/60. No hypoxia. Pertinent labs: BMP unremarkable. Lactic acid normal x2, WBC 21, INR 2.13. U/A negative.  Imaging: CXR no definite infiltrate of pulmonary edema  Review of Systems:  Positive for blurred vision 48 hours ago, muscle aches, vomiting, poor appetite. Negative for sore throat, rash, chest pain, SOB, dysuria, bleeding, abdominal pain, diarrhea .  Past Medical History  Diagnosis Date  . Osteoarthritis, knee     s/p B TKA  . HIV infection  ([name]) dx 1993  . TIA (transient ischemic attack) 1997    mild residual L mouth droop  . H/O hiatal hernia 2008    surgery  . Gynecomastia, male   . Impotence of organic origin   . Rheumatoid arthritis(714.0) dx 2010    MTX, follows with rheum  . Gout   . Chronic back pain     follows with Nsurg  . Seasonal allergies   . Diverticulosis   . Gallstones   . Status post dilation of esophageal narrowing   . GERD (gastroesophageal reflux disease)   . Diverticulosis   . Esophagitis   . Hemorrhoids   . Tubular adenoma of colon   . Myocardial infarction ([name]) 2010     x 2  . Hypertension   . DVT, lower extremity, recurrent ([name]) 2008, 2009    LLE, chronic anticoag since 2009  . Carotid artery occlusion     40-60% right ICA stenosis (09/2008)  . Neuromuscular disorder ([name])     neuropathy  . Fibromyalgia   . Hepatitis A yrs ago  . CLL (chronic lymphoblastic leukemia) dx 2010    Followed at [facility] q29mo, no current therapy   . Secondary syphilis 07/24/14 dx    s/p 2 wks doxy  . Type II diabetes mellitus with manifestations ([name]) 04/02/2011  . Coronary artery disease 2010    s/p CABG '10, sees Dr. Percival [name]    Past Surgical History  Procedure Laterality Date  . Spine surgery  2010    "rod and screws", "failed", lopwer spine,   . Cholecystectomy    . Hiatal hernia repair      wrap  .  Shoulder surgery Left   . Mandible surgery Bilateral     tmj  . Varicose vein      stripping  . Replacement total knee Bilateral   . Knee arthroplasty  07/22/2011    Procedure: COMPUTER ASSISTED TOTAL KNEE ARTHROPLASTY;  Surgeon: Christopher Pel, MD;  Location: [name];  Service: Orthopedics;  Laterality: Right;  Right total knee arthroplasty  . Coronary artery bypass graft  2010    triple bypass  . Hardware removal N/A 07/02/2012    Procedure: HARDWARE REMOVAL;  Surgeon: Christopher Hoops, MD;  Location: [name];  Service: Neurosurgery;  Laterality: N/A;  . Inguinal hernia repair Bilateral   .  Umbilical hernia repair      x 1  . Rotator cuff repair Right   . Esophagogastroduodenoscopy (egd) with propofol N/A 12/28/2012    Procedure: ESOPHAGOGASTRODUODENOSCOPY (EGD) WITH PROPOFOL;  Surgeon: Christopher Bears, MD;  Location: [name];  Service: Gastroenterology;  Laterality: N/A;  . Colonoscopy with propofol N/A 12/28/2012    Procedure: COLONOSCOPY WITH PROPOFOL;  Surgeon: Christopher Bears, MD;  Location: [name];  Service: Gastroenterology;  Laterality: N/A;  . Joint replacement Left 1999  . Tonsillectomy    . Ring around testicle hernia reapir  184 and 1986    x 2  . Esophagogastroduodenoscopy (egd) with propofol N/A 03/15/2013    Procedure: ESOPHAGOGASTRODUODENOSCOPY (EGD) WITH PROPOFOL;  Surgeon: Christopher Bears, MD;  Location: [name];  Service: Gastroenterology;  Laterality: N/A;    Social History:  reports that he has never smoked. He has never used smokeless tobacco. He reports that he drinks alcohol. He reports that he does not use illicit drugs.   Allergies  Allergen Reactions  . Morphine Other (See Comments)    REACTION: severe headache  . Other Anaphylaxis and Hives    Pecan  . Peanut-Containing Drug Products Anaphylaxis and Hives    Swelling of throat  . Oxycodone-Acetaminophen Other (See Comments)    REACTION: headache  . Penicillins Rash    Has patient had a PCN reaction causing immediate rash, facial/tongue/throat swelling, SOB or lightheadedness with hypotension: Yes Has patient had a PCN reaction causing severe rash involving mucus membranes or skin necrosis: No Has patient had a PCN reaction that required hospitalization No Has patient had a PCN reaction occurring within the last 10 years: No If all of the above answers are "NO", then may proceed with Cephalosporin use.   REACTION: red, flushed  . Promethazine Hcl Other (See Comments)    REACTION: makes him feel drunk    Family History  Problem Relation Age of Onset  . Breast cancer Mother   .  Hypertension Mother   . Hyperlipidemia Mother   . Diabetes Mother   . Prostate cancer Father   . Colon polyps Father   . Hyperlipidemia Father   . Crohn's disease Paternal Aunt   . Diabetes Maternal Grandmother   . Diabetes Brother     x 3  . Heart disease Brother     x 3  . Hyperlipidemia Brother     x 3  . Colon cancer Neg Hx   . Alcohol abuse Daughter   . Drug abuse Daughter   . Asthma Brother      Prior to Admission medications   Medication Sig Start Date End Date Taking? Authorizing Provider  atorvastatin (LIPITOR) 10 MG tablet Take 1 tablet (10 mg total) by mouth at bedtime. Patient taking differently: Take 10 mg by  mouth every morning.  11/30/14  Yes [name], MD  escitalopram (LEXAPRO) 10 MG tablet Take 10 mg by mouth daily.   Yes Historical Provider, MD  furosemide (LASIX) 20 MG tablet Take 1 tablet (20 mg total) by mouth 2 (two) times daily. Patient taking differently: Take 20 mg by mouth every evening.  05/17/15  Yes [name], MD  GENVOYA 150-150-200-10 MG TABS tablet TAKE 1 TABLET BY MOUTH DAILY WITH BREAKFAST. 01/08/15  Yes Thayer Headings, MD  levofloxacin (LEVAQUIN) 750 MG tablet Take 1 tablet (750 mg total) by mouth daily. 06/20/15  Yes Erline [name], MD  loratadine (CLARITIN) 10 MG tablet Take 1 tablet (10 mg total) by mouth daily. 03/13/15  Yes [name], MD  LYRICA 200 MG capsule TAKE 1 CAPSULE BY MOUTH TWICE DAILY 01/24/15  Yes Campbell Riches, MD  metoprolol succinate (TOPROL-XL) 25 MG 24 hr tablet Take 1 tablet (25 mg total) by mouth at bedtime. 01/05/15  Yes [name], MD  pantoprazole (PROTONIX) 40 MG tablet TAKE 1 TABLET EVERY DAY 06/19/14  Yes [name], MD  PREZISTA 800 MG tablet TAKE 1 TABLET (800 MG TOTAL) BY MOUTH DAILY. 01/22/15  Yes Thayer Headings, MD  saccharomyces boulardii (FLORASTOR) 250 MG capsule Take 1 capsule (250 mg total) by mouth 2 (two) times daily. 02/05/15  Yes [name] S Esterwood, PA-C  urea (CARMOL) 10 % cream  APPLY TOPICALLY AS NEEDED. Patient taking differently: APPLY TOPICALLY AS NEEDED -APPLIED TO BUTTOCKS 04/09/15  Yes Campbell Riches, MD  valACYclovir (VALTREX) 500 MG tablet TAKE 1 TABLET BY MOUTH EVERY DAY 04/09/15  Yes Thayer Headings, MD  warfarin (COUMADIN) 10 MG tablet TAKE 1/2 ON WEDNESDAY AND 1 TABLET ALL OTHER DAYS Patient taking differently: TAKE 10MG  ON WEDNESDAYS AND SATURDAYS, THEN TAKE 5MG  ON ALL OTHER DAYS 11/16/14  Yes [name], MD  zolpidem (AMBIEN) 5 MG tablet TAKE 1 TABLET BY MOUTH AT BEDTIME AS NEEDED FOR SLEEP 06/07/15  Yes Biagio Borg, MD  aspirin EC 81 MG tablet Take 81 mg by mouth daily.    Historical Provider, MD  azaTHIOprine (IMURAN) 50 MG tablet Take 50 mg by mouth 2 (two) times daily. 06/07/15   Historical Provider, MD  EPINEPHrine (EPIPEN 2-PAK) 0.3 mg/0.3 mL IJ SOAJ injection Inject 0.3 mLs (0.3 mg total) into the muscle once. 06/12/14   Rowe Clack, MD  escitalopram (LEXAPRO) 10 MG tablet Take 1 tablet (10 mg total) by mouth daily. 05/26/14 06/18/15  Rowe Clack, MD  ibuprofen (ADVIL,MOTRIN) 200 MG tablet Take 200 mg by mouth every 6 (six) hours as needed for headache.    Historical Provider, MD  inFLIXimab (REMICADE) 100 MG injection Inject into the vein every 6 (six) weeks.     Historical Provider, MD  predniSONE (DELTASONE) 20 MG tablet Take 2 tablets (40 mg total) by mouth daily with breakfast. Patient not taking: Reported on 07/02/2015 06/20/15   Erline [name], MD   Physical Exam: Filed Vitals:   07/02/15 1515 07/02/15 1530 07/02/15 1545 07/02/15 1654  BP:  99/66  110/60  Pulse: 92 89 90 88  Temp:    98.7 F (37.1 C)  TempSrc:    Oral  Resp: 17 15 20 18   Height:      Weight:    88.2 kg (194 lb 7.1 oz)  SpO2: 100% 92% 99% 97%   General:  Appears calm and mildly uncomfortable but not toxic. Eyes: PERRL,  normal lids, irises  ENT: grossly normal hearing, lips & tongue Neck: no LAD, masses or thyromegaly Cardiovascular: RRR, no  m/r/g. No LE edema. Respiratory: CTA bilaterally, no w/r/r. Normal respiratory effort. Abdomen: soft, ntnd, no masses appreciated Skin: no rash or induration noted Musculoskeletal: grossly normal tone BUE/BLE Psychiatric: grossly normal mood and affect, speech fluent and appropriate Neurologic: grossly non-focal.  Wt Readings from Last 3 Encounters:  07/02/15 88.2 kg (194 lb 7.1 oz)  06/29/15 87.998 kg (194 lb)  06/28/15 89.359 kg (197 lb)    Labs on Admission:  Basic Metabolic Panel:  Recent Labs Lab 07/02/15 1209 07/02/15 1622  NA 133* 137  K 4.0 3.7  CL 100* 108  CO2 25 25  GLUCOSE 110* 106*  BUN 28* 23*  CREATININE 1.21 1.07  CALCIUM 7.8* 6.7*    Liver Function Tests:  Recent Labs Lab 07/02/15 1209  AST 41  ALT 35  ALKPHOS 60  BILITOT 1.0  PROT 5.4*  ALBUMIN 3.1*   CBC:  Recent Labs Lab 07/02/15 1209  WBC 21.0*  NEUTROABS 2.5  HGB 15.3  HCT 45.6  MCV 87.5  PLT 155     Radiological Exams on Admission: Dg Chest 2 View  07/02/2015  CLINICAL DATA:  Vomiting, generalized weakness, high fever, aching all over EXAM: CHEST  2 VIEW COMPARISON:  06/18/2015 and 06/19/2015 FINDINGS: Cardiomediastinal silhouette is stable. Bilateral streaky lower lobe atelectasis, scarring or chronic ground-glass opacities are again noted right greater than left. No definite superimposed infiltrate or pulmonary edema. Status post median sternotomy. Stable mild compression deformity in upper lumbar spine just above the prior posterior lumbar fusion. Stable granuloma in right midlung. IMPRESSION: Bilateral streaky lower lobe atelectasis, scarring or chronic ground-glass opacities again noted right greater than left. No definite superimposed infiltrate or pulmonary edema. Electronically Signed   By: Lahoma Crocker M.D.   On: 07/02/2015 13:05     Principal Problem:   Fever Active Problems:   HIV disease ([name])   Rheumatoid arthritis ([place])   Chronic lymphoblastic leukemia ([name])    Postinflammatory pulmonary fibrosis ([name])   Assessment/Plan 1. Fever with generalized weakness, vomiting, fall at home, dehydration. Very mild headache, no neck pain. No SOB or pulm symptoms. Urinalysis negative. Chest x-ray without acute features. No rash or localizing symptoms. Leukocytosis could be related to CLL. Lactic acid normal. No evidence of sepsis. 2. HIV. 3. Postinflammatory pulmonary fibrosis, NSIP on recent high res chest CT. 4. RA on methotrexate 5. PMH CAD, CABG, DVT, fibromyalgia, CLL 6. Recent diagnosis of pneumonia treated with Levaquin and prednisone. Reported significant improvement as outpatient, completed prednisone, near finishing Levaquin.   Appears calm, comfortable. Lactic acid normal and vital signs are stable. No evidence of sepsis at this time. Possible influenza or viral illness. No signs or symptoms to suggest severe infection, but is immunocompromised  Supportive care, IV fluids, empiric Tamiflu.  Empiric antibiotics, follow-up culture data  Code Status: full code  DVT prophylaxis: warfarin Family Communication: none present, patient alert and understands plan Disposition Plan/Anticipated LOS: admit, 2 days  Time spent: 60 minutes  Murray Hodgkins, MD  [facility] Pager [phone] 07/02/2015, 6:38 PM

## 2015-07-02 NOTE — ED Notes (Signed)
Dr. Wyvonnia Dusky notified of potential code sepsis pt

## 2015-07-02 NOTE — ED Notes (Signed)
MD at bedside. 

## 2015-07-02 NOTE — ED Provider Notes (Signed)
CSN: [id number]     Arrival date & time 07/02/15  1134 History  By signing my name below, I, Terressa Koyanagi, attest that this documentation has been prepared under the direction and in the presence of Ripley Fraise, MD. Electronically Signed: Terressa Koyanagi, ED Scribe. 07/02/2015. 12:33 PM.  Chief Complaint  Patient presents with  . Fever   The history is provided by the patient. No language interpreter was used.   PCP: [name], MD HPI Comments: [name] is a 65 y.o. male, with PMHx noted below, who presents to the Emergency Department complaining of intermittent fever (with highest temp of 103.5 at home) onset 3 days ago. Associated Sx include weakness (pt states "I feel so weak, I can hardly stand"), generalized body aches, headache, vomiting (5 episodes in the last 24 hours), chills, one episode of productive cough with tan sputum, and decreased appetite. Pt also reports a fall earlier this morning, however, denies head trauma or LOC and he fell on carpet. Pt denies sore throat, diarrhea, dysuria, rash, or hematemesis. Pt denies any recent travel abroad. Pt denies Hx of CKD or dialysis.  His course is worsening.  Nothing improves his symptoms.   He is on remicade for arthritis  Pt notes he was also recently Dx with pneumonia and started on Levaquin and prednisone. Pt reports he has 5 more days remaining on the Levaquin.   Past Medical History  Diagnosis Date  . Osteoarthritis, knee     s/p B TKA  . HIV infection ([place]) dx 1993  . TIA (transient ischemic attack) 1997    mild residual L mouth droop  . H/O hiatal hernia 2008    surgery  . Gynecomastia, male   . Impotence of organic origin   . Rheumatoid arthritis(714.0) dx 2010    MTX, follows with rheum  . Gout   . Chronic back pain     follows with Nsurg  . Seasonal allergies   . Diverticulosis   . Gallstones   . Status post dilation of esophageal narrowing   . GERD (gastroesophageal reflux disease)   .  Diverticulosis   . Esophagitis   . Hemorrhoids   . Tubular adenoma of colon   . Myocardial infarction ([address]) 2010     x 2  . Hypertension   . DVT, lower extremity, recurrent ([place]) 2008, 2009    LLE, chronic anticoag since 2009  . Carotid artery occlusion     40-60% right ICA stenosis (09/2008)  . Neuromuscular disorder (HCC)     neuropathy  . Fibromyalgia   . Hepatitis A yrs ago  . CLL (chronic lymphoblastic leukemia) dx 2010    Followed at [facility] q36mo, no current therapy   . Secondary syphilis 07/24/14 dx    s/p 2 wks doxy  . Type II diabetes mellitus with manifestations ([place]) 04/02/2011  . Coronary artery disease 2010    s/p CABG '10, sees Dr. Percival Spanish   Past Surgical History  Procedure Laterality Date  . Spine surgery  2010    "rod and screws", "failed", lopwer spine,   . Cholecystectomy    . Hiatal hernia repair      wrap  . Shoulder surgery Left   . Mandible surgery Bilateral     tmj  . Varicose vein      stripping  . Replacement total knee Bilateral   . Knee arthroplasty  07/22/2011    Procedure: COMPUTER ASSISTED TOTAL KNEE ARTHROPLASTY;  Surgeon: Meredith Pel, MD;  Location: [facility];  Service: Orthopedics;  Laterality: Right;  Right total knee arthroplasty  . Coronary artery bypass graft  2010    triple bypass  . Hardware removal N/A 07/02/2012    Procedure: HARDWARE REMOVAL;  Surgeon: Elaina Hoops, MD;  Location: [facility];  Service: Neurosurgery;  Laterality: N/A;  . Inguinal hernia repair Bilateral   . Umbilical hernia repair      x 1  . Rotator cuff repair Right   . Esophagogastroduodenoscopy (egd) with propofol N/A 12/28/2012    Procedure: ESOPHAGOGASTRODUODENOSCOPY (EGD) WITH PROPOFOL;  Surgeon: Jerene Bears, MD;  Location: [facility];  Service: Gastroenterology;  Laterality: N/A;  . Colonoscopy with propofol N/A 12/28/2012    Procedure: COLONOSCOPY WITH PROPOFOL;  Surgeon: Jerene Bears, MD;  Location: [facility];  Service: Gastroenterology;   Laterality: N/A;  . Joint replacement Left 1999  . Tonsillectomy    . Ring around testicle hernia reapir  184 and 1986    x 2  . Esophagogastroduodenoscopy (egd) with propofol N/A 03/15/2013    Procedure: ESOPHAGOGASTRODUODENOSCOPY (EGD) WITH PROPOFOL;  Surgeon: Jerene Bears, MD;  Location: [facility];  Service: Gastroenterology;  Laterality: N/A;   Family History  Problem Relation Age of Onset  . Breast cancer Mother   . Hypertension Mother   . Hyperlipidemia Mother   . Diabetes Mother   . Prostate cancer Father   . Colon polyps Father   . Hyperlipidemia Father   . Crohn's disease Paternal Aunt   . Diabetes Maternal Grandmother   . Diabetes Brother     x 3  . Heart disease Brother     x 3  . Hyperlipidemia Brother     x 3  . Colon cancer Neg Hx   . Alcohol abuse Daughter   . Drug abuse Daughter   . Asthma Brother    Social History  Substance Use Topics  . Smoking status: Never Smoker   . Smokeless tobacco: Never Used     Comment: occ wine  . Alcohol Use: Yes     Comment: occasional wine- 1-2 per week    Review of Systems  Constitutional: Positive for fever, chills, appetite change and fatigue.  HENT: Negative for sore throat.   Respiratory: Positive for cough.   Gastrointestinal: Positive for nausea, vomiting and abdominal pain. Negative for diarrhea.  Genitourinary: Negative for dysuria.  Musculoskeletal:       Generalized body aches   Skin: Negative for rash.  Neurological: Positive for weakness and headaches.  All other systems reviewed and are negative.  Allergies  Morphine; Other; Peanut-containing drug products; Oxycodone-acetaminophen; Penicillins; and Promethazine hcl  Home Medications   Prior to Admission medications   Medication Sig Start Date End Date Taking? Authorizing Provider  aspirin EC 81 MG tablet Take 81 mg by mouth daily.    Historical Provider, MD  atorvastatin (LIPITOR) 10 MG tablet Take 1 tablet (10 mg total) by mouth at  bedtime. Patient taking differently: Take 10 mg by mouth every morning.  11/30/14   [name], MD  azaTHIOprine (IMURAN) 50 MG tablet Take 50 mg by mouth 2 (two) times daily. 06/07/15   Historical Provider, MD  EPINEPHrine (EPIPEN 2-PAK) 0.3 mg/0.3 mL IJ SOAJ injection Inject 0.3 mLs (0.3 mg total) into the muscle once. 06/12/14   Rowe Clack, MD  escitalopram (LEXAPRO) 10 MG tablet Take 1 tablet (10 mg total) by mouth daily. 05/26/14 06/18/15  Rowe Clack, MD  furosemide (LASIX) 20  MG tablet Take 1 tablet (20 mg total) by mouth 2 (two) times daily. Patient taking differently: Take 20 mg by mouth every evening.  05/17/15   [name], MD  GENVOYA 150-150-200-10 MG TABS tablet TAKE 1 TABLET BY MOUTH DAILY WITH BREAKFAST. 01/08/15   Thayer Headings, MD  ibuprofen (ADVIL,MOTRIN) 200 MG tablet Take 200 mg by mouth every 6 (six) hours as needed for headache.    Historical Provider, MD  inFLIXimab (REMICADE) 100 MG injection Inject into the vein every 6 (six) weeks.     Historical Provider, MD  levofloxacin (LEVAQUIN) 750 MG tablet Take 1 tablet (750 mg total) by mouth daily. 06/20/15   Erline [name], MD  loratadine (CLARITIN) 10 MG tablet Take 1 tablet (10 mg total) by mouth daily. 03/13/15   [name], MD  LYRICA 200 MG capsule TAKE 1 CAPSULE BY MOUTH TWICE DAILY 01/24/15   Campbell Riches, MD  metoprolol succinate (TOPROL-XL) 25 MG 24 hr tablet Take 1 tablet (25 mg total) by mouth at bedtime. 01/05/15   [name], MD  pantoprazole (PROTONIX) 40 MG tablet TAKE 1 TABLET EVERY DAY 06/19/14   [name], MD  predniSONE (DELTASONE) 20 MG tablet Take 2 tablets (40 mg total) by mouth daily with breakfast. 06/20/15   Erline [name], MD  PREZISTA 800 MG tablet TAKE 1 TABLET (800 MG TOTAL) BY MOUTH DAILY. 01/22/15   Thayer Headings, MD  saccharomyces boulardii (FLORASTOR) 250 MG capsule Take 1 capsule (250 mg total) by mouth 2 (two) times daily. 02/05/15   [name] S Esterwood,  PA-C  urea (CARMOL) 10 % cream APPLY TOPICALLY AS NEEDED. Patient taking differently: APPLY TOPICALLY AS NEEDED -APPLIED TO BUTTOCKS 04/09/15   Campbell Riches, MD  valACYclovir (VALTREX) 500 MG tablet TAKE 1 TABLET BY MOUTH EVERY DAY 04/09/15   Thayer Headings, MD  warfarin (COUMADIN) 10 MG tablet TAKE 1/2 ON WEDNESDAY AND 1 TABLET ALL OTHER DAYS Patient taking differently: TAKE 10MG  ON WEDNESDAYS AND SATURDAYS, THEN TAKE 5MG  ON ALL OTHER DAYS 11/16/14   [name], MD  zolpidem (AMBIEN) 5 MG tablet TAKE 1 TABLET BY MOUTH AT BEDTIME AS NEEDED FOR SLEEP 06/07/15   Biagio Borg, MD   Triage Vitals: BP 98/65 mmHg  Pulse 137  Temp(Src) 102.6 F (39.2 C) (Oral)  Resp 32  Ht 5\' 7"  (1.702 m)  Wt 190 lb (86.183 kg)  BMI 29.75 kg/m2 Physical Exam CONSTITUTIONAL: Ill appearing  HEAD: Normocephalic/atraumatic EYES: EOMI/PERRL ENMT: Mucous membranes dry  NECK: supple no meningeal signs SPINE/BACK:entire spine nontender CV: S1/S2 noted, no murmurs/rubs/gallops noted LUNGS: Coarse breath sounds bilateral bases ABDOMEN: soft, nontender, no rebound or guarding, bowel sounds noted throughout abdomen GU:no cva tenderness NEURO: Pt is awake/alert/appropriate, moves all extremitiesx4.  No facial droop.   EXTREMITIES: pulses normal/equal, full ROM SKIN: warm, color normal PSYCH: no abnormalities of mood noted, alert and oriented to situation  ED Course  Procedures  CRITICAL CARE Performed by: Sharyon Cable Total critical care time: 33 minutes Critical care time was exclusive of separately billable procedures and treating other patients. Critical care was necessary to treat or prevent imminent or life-threatening deterioration. Critical care was time spent personally by me on the following activities: development of treatment plan with patient and/or surrogate as well as nursing, discussions with consultants, evaluation of patient's response to treatment, examination of patient, obtaining  history from patient or surrogate, ordering and performing treatments and interventions, ordering  and review of laboratory studies, ordering and review of radiographic studies, pulse oximetry and re-evaluation of patient's condition. PATIENT WITH SEPSIS/TACHYCARDIA REQUIRING IV FLUIDS AND 2 IV ANTIBIOTICS  DIAGNOSTIC STUDIES: Oxygen Saturation is 93% on 2L/min.  COORDINATION OF CARE: 12:27 PM: Discussed treatment plan which includes meds, imaging, labs, likely hospital admission with pt at bedside; patient verbalizes understanding and agrees with treatment plan. 2:36 PM Pt with sepsis Recent bout of pneumonia and recent admission and he is still on levaquin He is high risk for worsening due to being on remicade for arthritis Unclear source, but did have recent pneumonia that was only seen on CT chest Urinalysis pending at this time Will need admission He has improved with IV fluids D/w dr Sarajane Jews, will admit  Labs Review Labs Reviewed  COMPREHENSIVE METABOLIC PANEL - Abnormal; Notable for the following:    Sodium 133 (*)    Chloride 100 (*)    Glucose, Bld 110 (*)    BUN 28 (*)    Calcium 7.8 (*)    Total Protein 5.4 (*)    Albumin 3.1 (*)    All other components within normal limits  CBC WITH DIFFERENTIAL/PLATELET - Abnormal; Notable for the following:    WBC 21.0 (*)    RDW 19.2 (*)    Lymphs Abs 18.5 (*)    Monocytes Absolute 0.0 (*)    All other components within normal limits  PROTIME-INR - Abnormal; Notable for the following:    Prothrombin Time 23.6 (*)    INR 2.13 (*)    All other components within normal limits  CULTURE, BLOOD (ROUTINE X 2)  CULTURE, BLOOD (ROUTINE X 2)  URINE CULTURE  URINALYSIS, ROUTINE W REFLEX MICROSCOPIC (NOT AT [facility])  INFLUENZA PANEL BY PCR (TYPE A & B, H1N1)  I-STAT CG4 LACTIC ACID, ED  I-STAT CG4 LACTIC ACID, ED    Imaging Review Dg Chest 2 View  07/02/2015  CLINICAL DATA:  Vomiting, generalized weakness, high fever, aching all over  EXAM: CHEST  2 VIEW COMPARISON:  06/18/2015 and 06/19/2015 FINDINGS: Cardiomediastinal silhouette is stable. Bilateral streaky lower lobe atelectasis, scarring or chronic ground-glass opacities are again noted right greater than left. No definite superimposed infiltrate or pulmonary edema. Status post median sternotomy. Stable mild compression deformity in upper lumbar spine just above the prior posterior lumbar fusion. Stable granuloma in right midlung. IMPRESSION: Bilateral streaky lower lobe atelectasis, scarring or chronic ground-glass opacities again noted right greater than left. No definite superimposed infiltrate or pulmonary edema. Electronically Signed   By: Lahoma Crocker M.D.   On: 07/02/2015 13:05   I have personally reviewed and evaluated these images and lab results as part of my medical decision-making.   MDM   Final diagnoses:  Sepsis, due to unspecified organism [facility])    Nursing notes including past medical history and social history reviewed and considered in documentation xrays/imaging reviewed by myself and considered during evaluation Labs/vital reviewed myself and considered during evaluation Previous records reviewed and considered   I personally performed the services described in this documentation, which was scribed in my presence. The recorded information has been reviewed and is accurate.       Ripley Fraise, MD 07/02/15 1438

## 2015-07-02 NOTE — Progress Notes (Signed)
ANTIBIOTIC CONSULT NOTE - INITIAL  Pharmacy Consult for Vancomycin & Azactam Indication: pneumonia  Allergies  Allergen Reactions  . Morphine Other (See Comments)    REACTION: severe headache  . Other Anaphylaxis and Hives    Pecan  . Peanut-Containing Drug Products Anaphylaxis and Hives    Swelling of throat  . Oxycodone-Acetaminophen Other (See Comments)    REACTION: headache  . Penicillins Rash    Has patient had a PCN reaction causing immediate rash, facial/tongue/throat swelling, SOB or lightheadedness with hypotension: Yes Has patient had a PCN reaction causing severe rash involving mucus membranes or skin necrosis: No Has patient had a PCN reaction that required hospitalization No Has patient had a PCN reaction occurring within the last 10 years: No If all of the above answers are "NO", then may proceed with Cephalosporin use.   REACTION: red, flushed  . Promethazine Hcl Other (See Comments)    REACTION: makes him feel drunk    Patient Measurements: Height: 5\' 7"  (170.2 cm) Weight: 190 lb (86.183 kg) IBW/kg (Calculated) : 66.1 Adjusted Body Weight:   Vital Signs: Temp: 99.5 F (37.5 C) (02/20 1415) Temp Source: Oral (02/20 1415) BP: 99/66 mmHg (02/20 1530) Pulse Rate: 90 (02/20 1545) Intake/Output from previous day:   Intake/Output from this shift:    Labs:  Recent Labs  07/02/15 1209  WBC 21.0*  HGB 15.3  PLT 155  CREATININE 1.21   Estimated Creatinine Clearance: 62.1 mL/min (by C-G formula based on Cr of 1.21). No results for input(s): VANCOTROUGH, VANCOPEAK, VANCORANDOM, GENTTROUGH, GENTPEAK, GENTRANDOM, TOBRATROUGH, TOBRAPEAK, TOBRARND, AMIKACINPEAK, AMIKACINTROU, AMIKACIN in the last 72 hours.   Microbiology: Recent Results (from the past 720 hour(s))  Urine culture     Status: None   Collection Time: 06/18/15 11:51 AM  Result Value Ref Range Status   Specimen Description URINE, CLEAN CATCH  Final   Special Requests NONE  Final   Culture    Final    6,000 COLONIES/mL INSIGNIFICANT GROWTH Performed at [facility]    Report Status 06/19/2015 FINAL  Final  Blood culture (routine x 2)     Status: None   Collection Time: 06/18/15  2:09 PM  Result Value Ref Range Status   Specimen Description BLOOD LEFT ANTECUBITAL  Final   Special Requests   Final    BOTTLES DRAWN AEROBIC AND ANAEROBIC AEB=12CC ANA=10CC   Culture NO GROWTH 5 DAYS  Final   Report Status 06/23/2015 FINAL  Final  Blood culture (routine x 2)     Status: None   Collection Time: 06/18/15  2:16 PM  Result Value Ref Range Status   Specimen Description BLOOD RIGHT ANTECUBITAL  Final   Special Requests BOTTLES DRAWN AEROBIC AND ANAEROBIC [place]  Final   Culture NO GROWTH 5 DAYS  Final   Report Status 06/23/2015 FINAL  Final    Medical History: Past Medical History  Diagnosis Date  . Osteoarthritis, knee     s/p B TKA  . HIV infection ([place]) dx 1993  . TIA (transient ischemic attack) 1997    mild residual L mouth droop  . H/O hiatal hernia 2008    surgery  . Gynecomastia, male   . Impotence of organic origin   . Rheumatoid arthritis(714.0) dx 2010    MTX, follows with rheum  . Gout   . Chronic back pain     follows with Nsurg  . Seasonal allergies   . Diverticulosis   . Gallstones   . Status  post dilation of esophageal narrowing   . GERD (gastroesophageal reflux disease)   . Diverticulosis   . Esophagitis   . Hemorrhoids   . Tubular adenoma of colon   . Myocardial infarction ([place]) 2010     x 2  . Hypertension   . DVT, lower extremity, recurrent ([place]) 2008, 2009    LLE, chronic anticoag since 2009  . Carotid artery occlusion     40-60% right ICA stenosis (09/2008)  . Neuromuscular disorder (HCC)     neuropathy  . Fibromyalgia   . Hepatitis A yrs ago  . CLL (chronic lymphoblastic leukemia) dx 2010    Followed at [facility] q44mo, no current therapy   . Secondary syphilis 07/24/14 dx    s/p 2 wks doxy  . Type II diabetes mellitus with  manifestations ([place]) 04/02/2011  . Coronary artery disease 2010    s/p CABG '10, sees Dr. Percival Spanish    Medications:  Scheduled:   Assessment: 51 male recently DX with pneumonia and started on Levaquin and prednisone. Start Vancomycin & Azactam for HCAP  Goal of Therapy:  Vancomycin trough level 15-20 mcg/ml  Plan:  Vancomycin 1500 mg IV load, then Vancomycin 1 GM IV every 12 hours Azactam 2 GM IV x 1 dose, then Azactam 1 GM IV every 8 hours Vancomycin trough at steady state Labs per protocol  Abner Greenspan, Arlina Sabina Bennett 07/02/2015,4:10 PM

## 2015-07-02 NOTE — ED Notes (Signed)
Pt states fever, body aches, generalized weakness, vomiting. Pt fell today but denies injury. Pt states symptoms began Friday night. Dx with pneumonia last week.

## 2015-07-03 LAB — BASIC METABOLIC PANEL WITH GFR
Anion gap: 5 (ref 5–15)
BUN: 16 mg/dL (ref 6–20)
CO2: 26 mmol/L (ref 22–32)
Calcium: 7.2 mg/dL — ABNORMAL LOW (ref 8.9–10.3)
Chloride: 103 mmol/L (ref 101–111)
Creatinine, Ser: 0.88 mg/dL (ref 0.61–1.24)
GFR calc Af Amer: >60 mL/min
GFR calc non Af Amer: >60 mL/min
Glucose, Bld: 93 mg/dL (ref 65–99)
Potassium: 3.8 mmol/L (ref 3.5–5.1)
Sodium: 134 mmol/L — ABNORMAL LOW (ref 135–145)

## 2015-07-03 LAB — CBC
HCT: 40.2 % (ref 39.0–52.0)
Hemoglobin: 13.1 g/dL (ref 13.0–17.0)
MCH: 29 pg (ref 26.0–34.0)
MCHC: 32.6 g/dL (ref 30.0–36.0)
MCV: 88.9 fL (ref 78.0–100.0)
Platelets: 134 K/uL — ABNORMAL LOW (ref 150–400)
RBC: 4.52 MIL/uL (ref 4.22–5.81)
RDW: 19.2 % — ABNORMAL HIGH (ref 11.5–15.5)
WBC: 17.4 K/uL — ABNORMAL HIGH (ref 4.0–10.5)

## 2015-07-03 LAB — URINE CULTURE: Culture: 1000

## 2015-07-03 MED ORDER — SULFAMETHOXAZOLE-TRIMETHOPRIM 400-80 MG/5ML IV SOLN
INTRAVENOUS | Status: AC
  Filled 2015-07-03: qty 3

## 2015-07-03 MED ORDER — WARFARIN - PHARMACIST DOSING INPATIENT
Freq: Every day | Status: DC
  Administered 2015-07-03: 18:00:00

## 2015-07-03 MED ORDER — IBUPROFEN 400 MG PO TABS
200.0000 mg | ORAL_TABLET | Freq: Four times a day (QID) | ORAL | Status: DC | PRN
  Administered 2015-07-04 – 2015-07-06 (×5): 200 mg via ORAL
  Filled 2015-07-03 (×6): qty 1

## 2015-07-03 MED ORDER — WARFARIN SODIUM 5 MG PO TABS
10.0000 mg | ORAL_TABLET | Freq: Once | ORAL | Status: AC
  Administered 2015-07-03: 10 mg via ORAL
  Filled 2015-07-03: qty 2

## 2015-07-03 MED ORDER — SULFAMETHOXAZOLE-TRIMETHOPRIM 400-80 MG/5ML IV SOLN
20.0000 mg/kg/d | Freq: Four times a day (QID) | INTRAVENOUS | Status: DC
  Administered 2015-07-03 – 2015-07-05 (×7): 441.6 mg via INTRAVENOUS
  Filled 2015-07-03 (×8): qty 27.6

## 2015-07-03 MED ORDER — IBUPROFEN 600 MG PO TABS: 600.0000 mg | ORAL_TABLET | Freq: Four times a day (QID) | ORAL | Status: DC | PRN

## 2015-07-03 MED ORDER — ACETAMINOPHEN 325 MG PO TABS
650.0000 mg | ORAL_TABLET | Freq: Four times a day (QID) | ORAL | Status: DC | PRN
  Administered 2015-07-03 – 2015-07-09 (×4): 650 mg via ORAL
  Filled 2015-07-03 (×4): qty 2

## 2015-07-03 NOTE — Progress Notes (Signed)
PROGRESS NOTE  [name] [id number] DOB: 1951-03-22 DOA: 07/02/2015 PCP: [name], MD  Summary: 55yom PMH HIV, RA on methotrexate, recently hospitalized for multilobar pneumonia and discharged on Levaquin and prednisone who presented with three-day history of fever, myalgias, generalized weakness. Lactic acid was within normal limits, basic metabolic panel showed mild dehydration, chest x-ray and urinalysis were unremarkable. He was treated empirically with antibiotics and referred for admission.   Assessment/Plan: 1. Fever with generalized weakness, one episode of vomiting only subsequently resolved, fall at home, dehydration. Very mild headache, no neck pain. No SOB or pulm symptoms. Urinalysis negative. Chest x-ray without acute features. No rash or localizing symptoms. Leukocytosis could be related to CLL. Lactic acid normal. No evidence of sepsis. Continues to have some fever, blood culture pending no growth. Repeat blood cultures today for fever. Continue broad-spectrum antibiotics. He reports he ate some cheese that he thinks was recalled for listeria contamination, however there is no gastroenteritis, no evidence to suggest CNS infection and blood cultures are no growth today. 2. HIV. Appears stable. 3. Postinflammatory pulmonary fibrosis, NSIP on recent high res chest CT. 4. RA on methotrexate 5. PMH CAD, CABG, DVT, fibromyalgia, CLL 6. Recent diagnosis of pneumonia treated with Levaquin and prednisone. Reported significant improvement as outpatient, completed prednisone, near finishing Levaquin.   Fever without localizing symptoms. Possible exposure to Listeria. No GI illness. No evidence to suggest CNS infection.  Continue empiric antibiotics, add IV Bactrim  Will discuss with infectious disease 2/22  Code Status: Full DVT prophylaxis: Warfarin  Family Communication: none present Disposition Plan: home when improved  Murray Hodgkins, MD  [facility]  Pager  [phone] If 7PM-7AM, please contact night-coverage at [email or web address], password [facility] 07/03/2015, 11:07 AM  LOS: 1 day   Consultants:  None   Procedures:  None  Antibiotics:  Vancomycin 2/20 >>  Aztreonam 2/20 >>  HPI/Subjective: Feels a bit better, was able to eat some. So short of breath. Has a low-grade headache 2/10. He does note that he ate some cheese last week and noticed that this cheese has been recalled for Listeria contamination. No vomiting, no abdominal pain, no rash.  Objective: Filed Vitals:   07/02/15 1545 07/02/15 1654 07/02/15 2100 07/03/15 0525  BP:  110/60 112/61 118/68  Pulse: 90 88 89 94  Temp:  98.7 F (37.1 C) 98.1 F (36.7 C) 101.6 F (38.7 C)  TempSrc:  Oral Oral   Resp: 20 18 18 18   Height:      Weight:  88.2 kg (194 lb 7.1 oz)    SpO2: 99% 97% 98% 98%    Intake/Output Summary (Last 24 hours) at 07/03/15 1107 Last data filed at 07/02/15 2245  Gross per 24 hour  Intake      0 ml  Output    450 ml  Net   -450 ml     Filed Weights   07/02/15 1152 07/02/15 1654  Weight: 86.183 kg (190 lb) 88.2 kg (194 lb 7.1 oz)    Exam:    General:  Appears calm, mildly uncomfortable, nontoxic Eyes: PERRL Cardiovascular: RRR, no m/r/g. No LE edema. Respiratory: CTA bilaterally, no w/r/r. Normal respiratory effort. Abdomen: soft, ntnd Skin: no rash or induration noted Psychiatric: grossly normal mood and affect, speech fluent and appropriate  New data reviewed:  CMP and CBC unremarkable.   CBG stable   Influenza negative  Scheduled Meds: . aspirin EC  81 mg Oral Daily  . atorvastatin  10 mg Oral q1800  .  azaTHIOprine  50 mg Oral BID  . aztreonam  1 g Intravenous Q8H  . Darunavir Ethanolate  800 mg Oral Q breakfast  . elvitegravir-cobicistat-emtricitabine-tenofovir  1 tablet Oral Q breakfast  . escitalopram  10 mg Oral Daily  . loratadine  10 mg Oral Daily  . metoprolol succinate  25 mg Oral QHS  . oseltamivir  75 mg Oral BID  .  pantoprazole  40 mg Oral Daily  . pregabalin  200 mg Oral BID  . saccharomyces boulardii  250 mg Oral BID  . sodium chloride flush  3 mL Intravenous Q12H  . sodium chloride flush  3 mL Intravenous Q12H  . valACYclovir  500 mg Oral Daily  . vancomycin  1,000 mg Intravenous Q12H   Continuous Infusions:   Principal Problem:   Fever Active Problems:   HIV disease ([place])   Rheumatoid arthritis ([place])   Chronic lymphoblastic leukemia ([place])   Postinflammatory pulmonary fibrosis ([place])   Time spent 20 minutes     By signing my name below, I, Rennis Harding attest that this documentation has been prepared under the direction and in the presence of Murray Hodgkins, MD Electronically signed: Rennis Harding    I personally performed the services described in this documentation. All medical record entries made by the scribe were at my direction. I have reviewed the chart and agree that the record reflects my personal performance and is accurate and complete. Murray Hodgkins, MD

## 2015-07-03 NOTE — Care Management Note (Signed)
Case Management Note  Patient Details  Name: [name] MRN: [id number] Date of Birth: 24-Aug-1950  Subjective/Objective:     Spoke with patient who is from home alone and independent. Alert and oriented. Does not use any DME. Drives self.  No Home O2.               Action/Plan:  Home with self care. Expected Discharge Date:                  Expected Discharge Plan:  Home/Self Care  In-House Referral:     Discharge planning Services  CM Consult  Post Acute Care Choice:    Choice offered to:     DME Arranged:    DME Agency:     HH Arranged:    [place] Agency:     Status of Service:  Completed, signed off  Medicare Important Message Given:    Date Medicare IM Given:    Medicare IM give by:    Date Additional Medicare IM Given:    Additional Medicare Important Message give by:     If discussed at [place] of Stay Meetings, dates discussed:    Additional Comments:  Alvie Heidelberg, RN 07/03/2015, 2:39 PM

## 2015-07-03 NOTE — Progress Notes (Signed)
ANTICOAGULATION CONSULT NOTE - Initial Consult  Pharmacy Consult for coumadin Indication: DVT  Allergies  Allergen Reactions  . Morphine Other (See Comments)    REACTION: severe headache  . Other Anaphylaxis and Hives    Pecan  . Peanut-Containing Drug Products Anaphylaxis and Hives    Swelling of throat  . Oxycodone-Acetaminophen Other (See Comments)    REACTION: headache  . Penicillins Rash    Has patient had a PCN reaction causing immediate rash, facial/tongue/throat swelling, SOB or lightheadedness with hypotension: Yes Has patient had a PCN reaction causing severe rash involving mucus membranes or skin necrosis: No Has patient had a PCN reaction that required hospitalization No Has patient had a PCN reaction occurring within the last 10 years: No If all of the above answers are "NO", then may proceed with Cephalosporin use.   REACTION: red, flushed  . Promethazine Hcl Other (See Comments)    REACTION: makes him feel drunk    Patient Measurements: Height: 5\' 7"  (170.2 cm) Weight: 194 lb 7.1 oz (88.2 kg) IBW/kg (Calculated) : 66.1  Vital Signs: Temp: 101.6 F (38.7 C) (02/21 0525) BP: 118/68 mmHg (02/21 0525) Pulse Rate: 94 (02/21 0525)  Labs:  Recent Labs  07/02/15 1209 07/02/15 1622 07/03/15 0553  HGB 15.3  --  13.1  HCT 45.6  --  40.2  PLT 155  --  134*  LABPROT 23.6*  --   --   INR 2.13*  --   --   CREATININE 1.21 1.07 0.88    Estimated Creatinine Clearance: 86.3 mL/min (by C-G formula based on Cr of 0.88).   Medical History: Past Medical History  Diagnosis Date  . Osteoarthritis, knee     s/p B TKA  . HIV infection ([place]) dx 1993  . TIA (transient ischemic attack) 1997    mild residual L mouth droop  . H/O hiatal hernia 2008    surgery  . Gynecomastia, male   . Impotence of organic origin   . Rheumatoid arthritis(714.0) dx 2010    MTX, follows with rheum  . Gout   . Chronic back pain     follows with Nsurg  . Seasonal allergies   .  Diverticulosis   . Gallstones   . Status post dilation of esophageal narrowing   . GERD (gastroesophageal reflux disease)   . Diverticulosis   . Esophagitis   . Hemorrhoids   . Tubular adenoma of colon   . Myocardial infarction ([place]) 2010     x 2  . Hypertension   . DVT, lower extremity, recurrent ([place]) 2008, 2009    LLE, chronic anticoag since 2009  . Carotid artery occlusion     40-60% right ICA stenosis (09/2008)  . Neuromuscular disorder (HCC)     neuropathy  . Fibromyalgia   . Hepatitis A yrs ago  . CLL (chronic lymphoblastic leukemia) dx 2010    Followed at [facility] q56mo, no current therapy   . Secondary syphilis 07/24/14 dx    s/p 2 wks doxy  . Type II diabetes mellitus with manifestations ([place]) 04/02/2011  . Coronary artery disease 2010    s/p CABG '10, sees Dr. Percival Spanish    Medications:  Prescriptions prior to admission  Medication Sig Dispense Refill Last Dose  . atorvastatin (LIPITOR) 10 MG tablet Take 1 tablet (10 mg total) by mouth at bedtime. (Patient taking differently: Take 10 mg by mouth every morning. ) 90 tablet 3 07/02/2015 at Unknown time  . escitalopram (LEXAPRO) 10 MG tablet  Take 10 mg by mouth daily.   07/02/2015 at Unknown time  . furosemide (LASIX) 20 MG tablet Take 1 tablet (20 mg total) by mouth 2 (two) times daily. (Patient taking differently: Take 20 mg by mouth every evening. ) 60 tablet 5 07/02/2015 at Unknown time  . GENVOYA 150-150-200-10 MG TABS tablet TAKE 1 TABLET BY MOUTH DAILY WITH BREAKFAST. 30 tablet 5 07/02/2015 at Unknown time  . levofloxacin (LEVAQUIN) 750 MG tablet Take 1 tablet (750 mg total) by mouth daily. 20 tablet 0 07/02/2015 at Unknown time  . loratadine (CLARITIN) 10 MG tablet Take 1 tablet (10 mg total) by mouth daily. 90 tablet 3 07/02/2015 at Unknown time  . LYRICA 200 MG capsule TAKE 1 CAPSULE BY MOUTH TWICE DAILY 60 capsule 5 07/02/2015 at Unknown time  . metoprolol succinate (TOPROL-XL) 25 MG 24 hr tablet Take 1 tablet (25 mg total)  by mouth at bedtime. 30 tablet 11 07/02/2015 at 0830  . pantoprazole (PROTONIX) 40 MG tablet TAKE 1 TABLET EVERY DAY 60 tablet 0 07/02/2015 at Unknown time  . PREZISTA 800 MG tablet TAKE 1 TABLET (800 MG TOTAL) BY MOUTH DAILY. 30 tablet 5 07/02/2015 at Unknown time  . saccharomyces boulardii (FLORASTOR) 250 MG capsule Take 1 capsule (250 mg total) by mouth 2 (two) times daily. 60 capsule 1 07/02/2015 at Unknown time  . urea (CARMOL) 10 % cream APPLY TOPICALLY AS NEEDED. (Patient taking differently: APPLY TOPICALLY AS NEEDED -APPLIED TO BUTTOCKS) 85 g 0 07/01/2015 at Unknown time  . valACYclovir (VALTREX) 500 MG tablet TAKE 1 TABLET BY MOUTH EVERY DAY 90 tablet 3 07/02/2015 at Unknown time  . warfarin (COUMADIN) 10 MG tablet TAKE 1/2 ON WEDNESDAY AND 1 TABLET ALL OTHER DAYS (Patient taking differently: TAKE 10MG  ON WEDNESDAYS AND SATURDAYS, THEN TAKE 5MG  ON ALL OTHER DAYS) 60 tablet 3 07/01/2015 at Unknown time  . zolpidem (AMBIEN) 5 MG tablet TAKE 1 TABLET BY MOUTH AT BEDTIME AS NEEDED FOR SLEEP 30 tablet 2 07/01/2015 at Unknown time  . aspirin EC 81 MG tablet Take 81 mg by mouth daily.   Taking  . azaTHIOprine (IMURAN) 50 MG tablet Take 50 mg by mouth 2 (two) times daily.  3 Taking  . EPINEPHrine (EPIPEN 2-PAK) 0.3 mg/0.3 mL IJ SOAJ injection Inject 0.3 mLs (0.3 mg total) into the muscle once. 2 Device 0 Taking  . escitalopram (LEXAPRO) 10 MG tablet Take 1 tablet (10 mg total) by mouth daily. 90 tablet 1 06/18/2015 at Unknown time  . ibuprofen (ADVIL,MOTRIN) 200 MG tablet Take 200 mg by mouth every 6 (six) hours as needed for headache.   Taking  . inFLIXimab (REMICADE) 100 MG injection Inject into the vein every 6 (six) weeks.    Taking  . predniSONE (DELTASONE) 20 MG tablet Take 2 tablets (40 mg total) by mouth daily with breakfast. (Patient not taking: Reported on 07/02/2015) 14 tablet 0 Not Taking at Unknown time    Assessment: 65 yo man to continue coumadin for h/o DVT.  INR yesterday 2.13.  Last dose of  coumadin was 2/19 Goal of Therapy:  INR 2-3 Monitor platelets by anticoagulation protocol: Yes   Plan:  Coumadin 10 mg po today Daily PT/INR Monitor for bleeding complications  Thanks for allowing pharmacy to be a part of this patient's care.  Excell Seltzer, PharmD Clinical Pharmacist  07/03/2015,12:22 PM

## 2015-07-03 NOTE — Progress Notes (Signed)
ANTIBIOTIC CONSULT NOTE - INITIAL  Pharmacy Consult for septra Indication: possible exposure to listeria  Allergies  Allergen Reactions  . Morphine Other (See Comments)    REACTION: severe headache  . Other Anaphylaxis and Hives    Pecan  . Peanut-Containing Drug Products Anaphylaxis and Hives    Swelling of throat  . Oxycodone-Acetaminophen Other (See Comments)    REACTION: headache  . Penicillins Rash    Has patient had a PCN reaction causing immediate rash, facial/tongue/throat swelling, SOB or lightheadedness with hypotension: Yes Has patient had a PCN reaction causing severe rash involving mucus membranes or skin necrosis: No Has patient had a PCN reaction that required hospitalization No Has patient had a PCN reaction occurring within the last 10 years: No If all of the above answers are "NO", then may proceed with Cephalosporin use.   REACTION: red, flushed  . Promethazine Hcl Other (See Comments)    REACTION: makes him feel drunk    Patient Measurements: Height: 5\' 7"  (170.2 cm) Weight: 194 lb 7.1 oz (88.2 kg) IBW/kg (Calculated) : 66.1   Vital Signs: Temp: 101.4 F (38.6 C) (02/21 1819) Temp Source: Oral (02/21 1819) BP: 166/68 mmHg (02/21 1440) Pulse Rate: 94 (02/21 1440) Intake/Output from previous day: 02/20 0701 - 02/21 0700 In: 300 [IV Piggyback:300] Out: 450 [Urine:450] Intake/Output from this shift:    Labs:  Recent Labs  07/02/15 1209 07/02/15 1622 07/03/15 0553  WBC 21.0*  --  17.4*  HGB 15.3  --  13.1  PLT 155  --  134*  CREATININE 1.21 1.07 0.88   Estimated Creatinine Clearance: 86.3 mL/min (by C-G formula based on Cr of 0.88). No results for input(s): VANCOTROUGH, VANCOPEAK, VANCORANDOM, GENTTROUGH, GENTPEAK, GENTRANDOM, TOBRATROUGH, TOBRAPEAK, TOBRARND, AMIKACINPEAK, AMIKACINTROU, AMIKACIN in the last 72 hours.   Microbiology: Recent Results (from the past 720 hour(s))  Urine culture     Status: None   Collection Time: 06/18/15  11:51 AM  Result Value Ref Range Status   Specimen Description URINE, CLEAN CATCH  Final   Special Requests NONE  Final   Culture   Final    6,000 COLONIES/mL INSIGNIFICANT GROWTH Performed at [facility]    Report Status 06/19/2015 FINAL  Final  Blood culture (routine x 2)     Status: None   Collection Time: 06/18/15  2:09 PM  Result Value Ref Range Status   Specimen Description BLOOD LEFT ANTECUBITAL  Final   Special Requests   Final    BOTTLES DRAWN AEROBIC AND ANAEROBIC AEB=12CC ANA=10CC   Culture NO GROWTH 5 DAYS  Final   Report Status 06/23/2015 FINAL  Final  Blood culture (routine x 2)     Status: None   Collection Time: 06/18/15  2:16 PM  Result Value Ref Range Status   Specimen Description BLOOD RIGHT ANTECUBITAL  Final   Special Requests BOTTLES DRAWN AEROBIC AND ANAEROBIC 8CC EACH  Final   Culture NO GROWTH 5 DAYS  Final   Report Status 06/23/2015 FINAL  Final  Culture, blood (routine x 2)     Status: None (Preliminary result)   Collection Time: 07/02/15 12:09 PM  Result Value Ref Range Status   Specimen Description BLOOD LEFT HAND DRAWN BY RN EW  Final   Special Requests BOTTLES DRAWN AEROBIC AND ANAEROBIC 4CC EACH  Final   Culture NO GROWTH 1 DAY  Final   Report Status PENDING  Incomplete  Culture, blood (routine x 2)     Status: None (Preliminary result)  Collection Time: 07/02/15 12:12 PM  Result Value Ref Range Status   Specimen Description BLOOD RIGHT ANTECUBITAL  Final   Special Requests BOTTLES DRAWN AEROBIC AND ANAEROBIC Agency  Final   Culture NO GROWTH 1 DAY  Final   Report Status PENDING  Incomplete  Urine culture     Status: None   Collection Time: 07/02/15  3:20 PM  Result Value Ref Range Status   Specimen Description URINE, CLEAN CATCH  Final   Special Requests NONE  Final   Culture   Final    1,000 COLONIES/mL INSIGNIFICANT GROWTH Performed at [facility]    Report Status 07/03/2015 FINAL  Final    Medical History: Past  Medical History  Diagnosis Date  . Osteoarthritis, knee     s/p B TKA  . HIV infection ([place]) dx 1993  . TIA (transient ischemic attack) 1997    mild residual L mouth droop  . H/O hiatal hernia 2008    surgery  . Gynecomastia, male   . Impotence of organic origin   . Rheumatoid arthritis(714.0) dx 2010    MTX, follows with rheum  . Gout   . Chronic back pain     follows with Nsurg  . Seasonal allergies   . Diverticulosis   . Gallstones   . Status post dilation of esophageal narrowing   . GERD (gastroesophageal reflux disease)   . Diverticulosis   . Esophagitis   . Hemorrhoids   . Tubular adenoma of colon   . Myocardial infarction ([place]) 2010     x 2  . Hypertension   . DVT, lower extremity, recurrent ([place]) 2008, 2009    LLE, chronic anticoag since 2009  . Carotid artery occlusion     40-60% right ICA stenosis (09/2008)  . Neuromuscular disorder (HCC)     neuropathy  . Fibromyalgia   . Hepatitis A yrs ago  . CLL (chronic lymphoblastic leukemia) dx 2010    Followed at [facility] q40mo, no current therapy   . Secondary syphilis 07/24/14 dx    s/p 2 wks doxy  . Type II diabetes mellitus with manifestations ([place]) 04/02/2011  . Coronary artery disease 2010    s/p CABG '10, sees Dr. Percival Spanish    Medications:  Prescriptions prior to admission  Medication Sig Dispense Refill Last Dose  . atorvastatin (LIPITOR) 10 MG tablet Take 1 tablet (10 mg total) by mouth at bedtime. (Patient taking differently: Take 10 mg by mouth every morning. ) 90 tablet 3 07/02/2015 at Unknown time  . escitalopram (LEXAPRO) 10 MG tablet Take 10 mg by mouth daily.   07/02/2015 at Unknown time  . furosemide (LASIX) 20 MG tablet Take 1 tablet (20 mg total) by mouth 2 (two) times daily. (Patient taking differently: Take 20 mg by mouth every evening. ) 60 tablet 5 07/02/2015 at Unknown time  . GENVOYA 150-150-200-10 MG TABS tablet TAKE 1 TABLET BY MOUTH DAILY WITH BREAKFAST. 30 tablet 5 07/02/2015 at Unknown time  .  levofloxacin (LEVAQUIN) 750 MG tablet Take 1 tablet (750 mg total) by mouth daily. 20 tablet 0 07/02/2015 at Unknown time  . loratadine (CLARITIN) 10 MG tablet Take 1 tablet (10 mg total) by mouth daily. 90 tablet 3 07/02/2015 at Unknown time  . LYRICA 200 MG capsule TAKE 1 CAPSULE BY MOUTH TWICE DAILY 60 capsule 5 07/02/2015 at Unknown time  . metoprolol succinate (TOPROL-XL) 25 MG 24 hr tablet Take 1 tablet (25 mg total) by mouth at bedtime. [place]  tablet 11 07/02/2015 at 0830  . pantoprazole (PROTONIX) 40 MG tablet TAKE 1 TABLET EVERY DAY 60 tablet 0 07/02/2015 at Unknown time  . PREZISTA 800 MG tablet TAKE 1 TABLET (800 MG TOTAL) BY MOUTH DAILY. 30 tablet 5 07/02/2015 at Unknown time  . saccharomyces boulardii (FLORASTOR) 250 MG capsule Take 1 capsule (250 mg total) by mouth 2 (two) times daily. 60 capsule 1 07/02/2015 at Unknown time  . urea (CARMOL) 10 % cream APPLY TOPICALLY AS NEEDED. (Patient taking differently: APPLY TOPICALLY AS NEEDED -APPLIED TO BUTTOCKS) 85 g 0 07/01/2015 at Unknown time  . valACYclovir (VALTREX) 500 MG tablet TAKE 1 TABLET BY MOUTH EVERY DAY 90 tablet 3 07/02/2015 at Unknown time  . warfarin (COUMADIN) 10 MG tablet TAKE 1/2 ON WEDNESDAY AND 1 TABLET ALL OTHER DAYS (Patient taking differently: TAKE 10MG  ON WEDNESDAYS AND SATURDAYS, THEN TAKE 5MG  ON ALL OTHER DAYS) 60 tablet 3 07/01/2015 at Unknown time  . zolpidem (AMBIEN) 5 MG tablet TAKE 1 TABLET BY MOUTH AT BEDTIME AS NEEDED FOR SLEEP 30 tablet 2 07/01/2015 at Unknown time  . aspirin EC 81 MG tablet Take 81 mg by mouth daily.   Taking  . azaTHIOprine (IMURAN) 50 MG tablet Take 50 mg by mouth 2 (two) times daily.  3 Taking  . EPINEPHrine (EPIPEN 2-PAK) 0.3 mg/0.3 mL IJ SOAJ injection Inject 0.3 mLs (0.3 mg total) into the muscle once. 2 Device 0 Taking  . escitalopram (LEXAPRO) 10 MG tablet Take 1 tablet (10 mg total) by mouth daily. 90 tablet 1 06/18/2015 at Unknown time  . ibuprofen (ADVIL,MOTRIN) 200 MG tablet Take 200 mg by mouth  every 6 (six) hours as needed for headache.   Taking  . inFLIXimab (REMICADE) 100 MG injection Inject into the vein every 6 (six) weeks.    Taking  . predniSONE (DELTASONE) 20 MG tablet Take 2 tablets (40 mg total) by mouth daily with breakfast. (Patient not taking: Reported on 07/02/2015) 14 tablet 0 Not Taking at Unknown time   Assessment: 65 yo man who is immunocompromised to start bactrim for possible exposure to listeria.  Goal of Therapy:  Eradication of infection  Plan:  Septra>> 20 mg/kg day TMP q 6 hours F/u renal function, cultures and clinical course  Chee Dimon Poteet 07/03/2015,7:18 PM

## 2015-07-04 ENCOUNTER — Inpatient Hospital Stay (HOSPITAL_COMMUNITY): Payer: Medicare Other

## 2015-07-04 DIAGNOSIS — M05729 Rheumatoid arthritis with rheumatoid factor of unspecified elbow without organ or systems involvement: Secondary | ICD-10-CM

## 2015-07-04 LAB — BASIC METABOLIC PANEL
Anion gap: 6 (ref 5–15)
BUN: 13 mg/dL (ref 6–20)
CO2: 26 mmol/L (ref 22–32)
Calcium: 7.3 mg/dL — ABNORMAL LOW (ref 8.9–10.3)
Chloride: 102 mmol/L (ref 101–111)
Creatinine, Ser: 0.78 mg/dL (ref 0.61–1.24)
GFR calc Af Amer: >60 mL/min (ref >60)
GFR calc non Af Amer: >60 mL/min (ref >60)
Glucose, Bld: 95 mg/dL (ref 65–99)
Potassium: 3.5 mmol/L (ref 3.5–5.1)
Sodium: 134 mmol/L — ABNORMAL LOW (ref 135–145)

## 2015-07-04 LAB — PROTIME-INR
INR: 1.77 — ABNORMAL HIGH (ref 0.00–1.49)
Prothrombin Time: 20.6 seconds — ABNORMAL HIGH (ref 11.6–15.2)

## 2015-07-04 LAB — CBC
HCT: 40.3 % (ref 39.0–52.0)
Hemoglobin: 13.5 g/dL (ref 13.0–17.0)
MCH: 29.4 pg (ref 26.0–34.0)
MCHC: 33.5 g/dL (ref 30.0–36.0)
MCV: 87.8 fL (ref 78.0–100.0)
Platelets: 115 10*3/uL — ABNORMAL LOW (ref 150–400)
RBC: 4.59 MIL/uL (ref 4.22–5.81)
RDW: 19 % — ABNORMAL HIGH (ref 11.5–15.5)
WBC: 15.7 10*3/uL — ABNORMAL HIGH (ref 4.0–10.5)

## 2015-07-04 MED ORDER — WARFARIN - PHARMACIST DOSING INPATIENT
Status: DC
  Administered 2015-07-09 – 2015-07-10 (×2)

## 2015-07-04 MED ORDER — IOHEXOL 300 MG/ML  SOLN
100.0000 mL | Freq: Once | INTRAMUSCULAR | Status: AC | PRN
  Administered 2015-07-04: 100 mL via INTRAVENOUS

## 2015-07-04 MED ORDER — WARFARIN SODIUM 5 MG PO TABS
10.0000 mg | ORAL_TABLET | Freq: Once | ORAL | Status: AC
  Administered 2015-07-04: 10 mg via ORAL
  Filled 2015-07-04: qty 2

## 2015-07-04 NOTE — Progress Notes (Signed)
ANTICOAGULATION CONSULT NOTE - follow up  Pharmacy Consult for coumadin Indication: DVT  Allergies  Allergen Reactions  . Morphine Other (See Comments)    REACTION: severe headache  . Other Anaphylaxis and Hives    Pecan  . Peanut-Containing Drug Products Anaphylaxis and Hives    Swelling of throat  . Oxycodone-Acetaminophen Other (See Comments)    REACTION: headache  . Penicillins Rash    Has patient had a PCN reaction causing immediate rash, facial/tongue/throat swelling, SOB or lightheadedness with hypotension: Yes Has patient had a PCN reaction causing severe rash involving mucus membranes or skin necrosis: No Has patient had a PCN reaction that required hospitalization No Has patient had a PCN reaction occurring within the last 10 years: No If all of the above answers are "NO", then may proceed with Cephalosporin use.   REACTION: red, flushed  . Promethazine Hcl Other (See Comments)    REACTION: makes him feel drunk   Patient Measurements: Height: 5\' 7"  (170.2 cm) Weight: 194 lb 7.1 oz (88.2 kg) IBW/kg (Calculated) : 66.1  Vital Signs: Temp: 100.7 F (38.2 C) (02/22 1503) Temp Source: Oral (02/22 1503) BP: 121/66 mmHg (02/22 1503) Pulse Rate: 98 (02/22 1503)  Labs:  Recent Labs  07/02/15 1209 07/02/15 1622 07/03/15 0553 07/04/15 0543  HGB 15.3  --  13.1 13.5  HCT 45.6  --  40.2 40.3  PLT 155  --  134* 115*  LABPROT 23.6*  --   --  20.6*  INR 2.13*  --   --  1.77*  CREATININE 1.21 1.07 0.88 0.78   Estimated Creatinine Clearance: 94.9 mL/min (by C-G formula based on Cr of 0.78).  Medical History: Past Medical History  Diagnosis Date  . Osteoarthritis, knee     s/p B TKA  . HIV infection ([place]) dx 1993  . TIA (transient ischemic attack) 1997    mild residual L mouth droop  . H/O hiatal hernia 2008    surgery  . Gynecomastia, male   . Impotence of organic origin   . Rheumatoid arthritis(714.0) dx 2010    MTX, follows with rheum  . Gout   .  Chronic back pain     follows with Nsurg  . Seasonal allergies   . Diverticulosis   . Gallstones   . Status post dilation of esophageal narrowing   . GERD (gastroesophageal reflux disease)   . Diverticulosis   . Esophagitis   . Hemorrhoids   . Tubular adenoma of colon   . Myocardial infarction ([place]) 2010     x 2  . Hypertension   . DVT, lower extremity, recurrent ([place]) 2008, 2009    LLE, chronic anticoag since 2009  . Carotid artery occlusion     40-60% right ICA stenosis (09/2008)  . Neuromuscular disorder (HCC)     neuropathy  . Fibromyalgia   . Hepatitis A yrs ago  . CLL (chronic lymphoblastic leukemia) dx 2010    Followed at [facility] q41mo, no current therapy   . Secondary syphilis 07/24/14 dx    s/p 2 wks doxy  . Type II diabetes mellitus with manifestations ([place]) 04/02/2011  . Coronary artery disease 2010    s/p CABG '10, sees Dr. Percival Spanish   Medications:  Prescriptions prior to admission  Medication Sig Dispense Refill Last Dose  . atorvastatin (LIPITOR) 10 MG tablet Take 1 tablet (10 mg total) by mouth at bedtime. (Patient taking differently: Take 10 mg by mouth every morning. ) 90 tablet 3 07/02/2015 at  Unknown time  . escitalopram (LEXAPRO) 10 MG tablet Take 10 mg by mouth daily.   07/02/2015 at Unknown time  . furosemide (LASIX) 20 MG tablet Take 1 tablet (20 mg total) by mouth 2 (two) times daily. (Patient taking differently: Take 20 mg by mouth every evening. ) 60 tablet 5 07/02/2015 at Unknown time  . GENVOYA 150-150-200-10 MG TABS tablet TAKE 1 TABLET BY MOUTH DAILY WITH BREAKFAST. 30 tablet 5 07/02/2015 at Unknown time  . levofloxacin (LEVAQUIN) 750 MG tablet Take 1 tablet (750 mg total) by mouth daily. 20 tablet 0 07/02/2015 at Unknown time  . loratadine (CLARITIN) 10 MG tablet Take 1 tablet (10 mg total) by mouth daily. 90 tablet 3 07/02/2015 at Unknown time  . LYRICA 200 MG capsule TAKE 1 CAPSULE BY MOUTH TWICE DAILY 60 capsule 5 07/02/2015 at Unknown time  . metoprolol  succinate (TOPROL-XL) 25 MG 24 hr tablet Take 1 tablet (25 mg total) by mouth at bedtime. 30 tablet 11 07/02/2015 at 0830  . pantoprazole (PROTONIX) 40 MG tablet TAKE 1 TABLET EVERY DAY 60 tablet 0 07/02/2015 at Unknown time  . PREZISTA 800 MG tablet TAKE 1 TABLET (800 MG TOTAL) BY MOUTH DAILY. 30 tablet 5 07/02/2015 at Unknown time  . saccharomyces boulardii (FLORASTOR) 250 MG capsule Take 1 capsule (250 mg total) by mouth 2 (two) times daily. 60 capsule 1 07/02/2015 at Unknown time  . urea (CARMOL) 10 % cream APPLY TOPICALLY AS NEEDED. (Patient taking differently: APPLY TOPICALLY AS NEEDED -APPLIED TO BUTTOCKS) 85 g 0 07/01/2015 at Unknown time  . valACYclovir (VALTREX) 500 MG tablet TAKE 1 TABLET BY MOUTH EVERY DAY 90 tablet 3 07/02/2015 at Unknown time  . warfarin (COUMADIN) 10 MG tablet TAKE 1/2 ON WEDNESDAY AND 1 TABLET ALL OTHER DAYS (Patient taking differently: TAKE 10MG  ON WEDNESDAYS AND SATURDAYS, THEN TAKE 5MG  ON ALL OTHER DAYS) 60 tablet 3 07/01/2015 at Unknown time  . zolpidem (AMBIEN) 5 MG tablet TAKE 1 TABLET BY MOUTH AT BEDTIME AS NEEDED FOR SLEEP 30 tablet 2 07/01/2015 at Unknown time  . aspirin EC 81 MG tablet Take 81 mg by mouth daily.   Taking  . azaTHIOprine (IMURAN) 50 MG tablet Take 50 mg by mouth 2 (two) times daily.  3 Taking  . EPINEPHrine (EPIPEN 2-PAK) 0.3 mg/0.3 mL IJ SOAJ injection Inject 0.3 mLs (0.3 mg total) into the muscle once. 2 Device 0 Taking  . escitalopram (LEXAPRO) 10 MG tablet Take 1 tablet (10 mg total) by mouth daily. 90 tablet 1 06/18/2015 at Unknown time  . ibuprofen (ADVIL,MOTRIN) 200 MG tablet Take 200 mg by mouth every 6 (six) hours as needed for headache.   Taking  . inFLIXimab (REMICADE) 100 MG injection Inject into the vein every 6 (six) weeks.    Taking  . predniSONE (DELTASONE) 20 MG tablet Take 2 tablets (40 mg total) by mouth daily with breakfast. (Patient not taking: Reported on 07/02/2015) 14 tablet 0 Not Taking at Unknown time   Assessment: 65 yo man  to continue coumadin for h/o DVT.  INR therapeutic on 2/20 but has trended down today to 1.77.  Platelets trending down.  Pt on Bactrim which can interact w/ Warfarin.  Goal of Therapy:  INR 2-3 Monitor platelets by anticoagulation protocol: Yes   Plan:  Repeat Coumadin 10 mg po today Daily PT/INR, f/u CBC and platelets Monitor for bleeding complications  Thanks for allowing pharmacy to be a part of this patient's care.  Hart Robinsons, PharmD  Clinical Pharmacist 07/04/2015,3:34 PM

## 2015-07-04 NOTE — Progress Notes (Signed)
[facility] PROGRESS NOTE  TANER MCCLENDON [id number] DOB: 27-Jul-1950 DOA: 07/02/2015 PCP: [name], MD  Assessment/Plan: FUO -Remains febrile, chest x-ray and urinalysis without signs of infection. -2 sets of blood cultures remain negative to date. -Flu PCR is negative. -He is immunesuppressed with his HIV and rheumatoid arthritis.  -Will request CT chest abdomen and pelvis, will request ID assistance in a.m. -Continue broad-spectrum antibiotics pending results of CT scan; consider discontinuing given unknown etiology of fever.  HIV -Continue antiretrovirals.  Rheumatoid arthritis -Continue methotrexate  Recent diagnosis of pneumonia -Completed treatment with Levaquin. -Chest x-ray without sign of recurrence.  Code Status: Full code Family Communication: Patient only  Disposition Plan: To be determined   Consultants:  None   Antibiotics:  Vancomycin  Aztreonam   Subjective: "I feel miserable", continues to be febrile, generalized weakness  Objective: Filed Vitals:   07/04/15 0500 07/04/15 1024 07/04/15 1302 07/04/15 1503  BP:    121/66  Pulse:    98  Temp: 99.6 F (37.6 C)  100.5 F (38.1 C) 100.7 F (38.2 C)  TempSrc:   Oral Oral  Resp:    20  Height:      Weight:      SpO2:  88%  98%    Intake/Output Summary (Last 24 hours) at 07/04/15 1730 Last data filed at 07/04/15 1512  Gross per 24 hour  Intake 3832.2 ml  Output   2275 ml  Net 1557.2 ml   Filed Weights   07/02/15 1152 07/02/15 1654  Weight: 86.183 kg (190 lb) 88.2 kg (194 lb 7.1 oz)    Exam:   General:  Alert, awake, oriented 3  Cardiovascular: Regular rate and rhythm, no murmurs, rubs or gallops  Respiratory: Clear to auscultation bilaterally  Abdomen: Soft, nontender, nondistended, positive bowel sounds  Extremities: No clubbing, cyanosis or edema, positive pulses   Neurologic:  Grossly intact and nonfocal  Data Reviewed: Basic Metabolic  Panel:  Recent Labs Lab 07/02/15 1209 07/02/15 1622 07/03/15 0553 07/04/15 0543  NA 133* 137 134* 134*  K 4.0 3.7 3.8 3.5  CL 100* 108 103 102  CO2 25 25 26 26   GLUCOSE 110* 106* 93 95  BUN 28* 23* 16 13  CREATININE 1.21 1.07 0.88 0.78  CALCIUM 7.8* 6.7* 7.2* 7.3*   Liver Function Tests:  Recent Labs Lab 07/02/15 1209  AST 41  ALT 35  ALKPHOS 60  BILITOT 1.0  PROT 5.4*  ALBUMIN 3.1*   No results for input(s): LIPASE, AMYLASE in the last 168 hours. No results for input(s): AMMONIA in the last 168 hours. CBC:  Recent Labs Lab 07/02/15 1209 07/03/15 0553 07/04/15 0543  WBC 21.0* 17.4* 15.7*  NEUTROABS 2.5  --   --   HGB 15.3 13.1 13.5  HCT 45.6 40.2 40.3  MCV 87.5 88.9 87.8  PLT 155 134* 115*   Cardiac Enzymes: No results for input(s): CKTOTAL, CKMB, CKMBINDEX, TROPONINI in the last 168 hours. BNP (last 3 results)  Recent Labs  04/20/15 1153  BNP 164.7*    ProBNP (last 3 results)  Recent Labs  04/19/15 1717 04/26/15 1653  PROBNP 189.0* 182.0*    CBG: No results for input(s): GLUCAP in the last 168 hours.  Recent Results (from the past 240 hour(s))  Culture, blood (routine x 2)     Status: None (Preliminary result)   Collection Time: 07/02/15 12:09 PM  Result Value Ref Range Status   Specimen Description BLOOD LEFT HAND DRAWN BY  RN EW  Final   Special Requests BOTTLES DRAWN AEROBIC AND ANAEROBIC 4CC EACH  Final   Culture NO GROWTH 2 DAYS  Final   Report Status PENDING  Incomplete  Culture, blood (routine x 2)     Status: None (Preliminary result)   Collection Time: 07/02/15 12:12 PM  Result Value Ref Range Status   Specimen Description BLOOD RIGHT ANTECUBITAL  Final   Special Requests BOTTLES DRAWN AEROBIC AND ANAEROBIC [place]  Final   Culture NO GROWTH 2 DAYS  Final   Report Status PENDING  Incomplete  Urine culture     Status: None   Collection Time: 07/02/15  3:20 PM  Result Value Ref Range Status   Specimen Description URINE,  CLEAN CATCH  Final   Special Requests NONE  Final   Culture   Final    1,000 COLONIES/mL INSIGNIFICANT GROWTH Performed at [facility]    Report Status 07/03/2015 FINAL  Final  Culture, blood (Routine X 2) w Reflex to ID Panel     Status: None (Preliminary result)   Collection Time: 07/03/15  7:30 PM  Result Value Ref Range Status   Specimen Description BLOOD LEFT ARM  Final   Special Requests BOTTLES DRAWN AEROBIC AND ANAEROBIC 6CC  Final   Culture NO GROWTH < 24 HOURS  Final   Report Status PENDING  Incomplete  Culture, blood (Routine X 2) w Reflex to ID Panel     Status: None (Preliminary result)   Collection Time: 07/03/15  7:33 PM  Result Value Ref Range Status   Specimen Description BLOOD LEFT ARM  Final   Special Requests BOTTLES DRAWN AEROBIC AND ANAEROBIC 6CC  Final   Culture NO GROWTH < 24 HOURS  Final   Report Status PENDING  Incomplete     Studies: No results found.  Scheduled Meds: . aspirin EC  81 mg Oral Daily  . atorvastatin  10 mg Oral q1800  . azaTHIOprine  50 mg Oral BID  . aztreonam  1 g Intravenous Q8H  . Darunavir Ethanolate  800 mg Oral Q breakfast  . elvitegravir-cobicistat-emtricitabine-tenofovir  1 tablet Oral Q breakfast  . escitalopram  10 mg Oral Daily  . loratadine  10 mg Oral Daily  . metoprolol succinate  25 mg Oral QHS  . oseltamivir  75 mg Oral BID  . pantoprazole  40 mg Oral Daily  . pregabalin  200 mg Oral BID  . saccharomyces boulardii  250 mg Oral BID  . sodium chloride flush  3 mL Intravenous Q12H  . sodium chloride flush  3 mL Intravenous Q12H  . sulfamethoxazole-trimethoprim  20 mg/kg/day Intravenous 4 times per day  . valACYclovir  500 mg Oral Daily  . vancomycin  1,000 mg Intravenous Q12H  . warfarin  10 mg Oral Once  . Warfarin - Pharmacist Dosing Inpatient   Does not apply Q24H   Continuous Infusions:   Principal Problem:   Fever Active Problems:   HIV disease (HCC)   Rheumatoid arthritis ([place])   Chronic  lymphoblastic leukemia ([place])   Postinflammatory pulmonary fibrosis ([place])    Time spent: 25 minutes. Greater than 50% of this time was spent in direct contact with the patient coordinating care.    Lelon Frohlich  [facility] Pager [phone]  If 7PM-7AM, please contact night-coverage at [email or web address], password [facility] 07/04/2015, 5:30 PM  LOS: 2 days

## 2015-07-05 ENCOUNTER — Other Ambulatory Visit: Payer: Self-pay | Admitting: Internal Medicine

## 2015-07-05 LAB — CBC
HCT: 37.6 % — ABNORMAL LOW (ref 39.0–52.0)
Hemoglobin: 12.8 g/dL — ABNORMAL LOW (ref 13.0–17.0)
MCH: 28.9 pg (ref 26.0–34.0)
MCHC: 34 g/dL (ref 30.0–36.0)
MCV: 84.9 fL (ref 78.0–100.0)
Platelets: 120 10*3/uL — ABNORMAL LOW (ref 150–400)
RBC: 4.43 MIL/uL (ref 4.22–5.81)
RDW: 18.6 % — ABNORMAL HIGH (ref 11.5–15.5)
WBC: 16.3 10*3/uL — ABNORMAL HIGH (ref 4.0–10.5)

## 2015-07-05 LAB — PROTIME-INR
INR: 3.37 — ABNORMAL HIGH (ref 0.00–1.49)
Prothrombin Time: 33.4 seconds — ABNORMAL HIGH (ref 11.6–15.2)

## 2015-07-05 MED ORDER — MENTHOL 3 MG MT LOZG
1.0000 | LOZENGE | OROMUCOSAL | Status: DC | PRN
  Administered 2015-07-05 – 2015-07-06 (×3): 3 mg via ORAL
  Filled 2015-07-05 (×3): qty 9
  Filled 2015-07-05: qty 1

## 2015-07-05 NOTE — Progress Notes (Addendum)
[place] for Vancomycin & Azactam Indication: pneumonia, FUO  Allergies  Allergen Reactions  . Morphine Other (See Comments)    REACTION: severe headache  . Other Anaphylaxis and Hives    Pecan  . Peanut-Containing Drug Products Anaphylaxis and Hives    Swelling of throat  . Oxycodone-Acetaminophen Other (See Comments)    REACTION: headache  . Penicillins Rash    Has patient had a PCN reaction causing immediate rash, facial/tongue/throat swelling, SOB or lightheadedness with hypotension: Yes Has patient had a PCN reaction causing severe rash involving mucus membranes or skin necrosis: No Has patient had a PCN reaction that required hospitalization No Has patient had a PCN reaction occurring within the last 10 years: No If all of the above answers are "NO", then may proceed with Cephalosporin use.   REACTION: red, flushed  . Promethazine Hcl Other (See Comments)    REACTION: makes him feel drunk    Patient Measurements: Height: 5\' 7"  (170.2 cm) Weight: 194 lb 7.1 oz (88.2 kg) IBW/kg (Calculated) : 66.1 Adjusted Body Weight:   Vital Signs: Temp: 99.1 F (37.3 C) (02/23 0541) Temp Source: Oral (02/23 0541) BP: 120/72 mmHg (02/23 0541) Pulse Rate: 88 (02/23 0541) Intake/Output from previous day: 02/22 0701 - 02/23 0700 In: 2680.6 [P.O.:1200; I.V.:3; IV Piggyback:1477.6] Out: 2650 [Urine:2650] Intake/Output from this shift:    Labs:  Recent Labs  07/02/15 1622 07/03/15 0553 07/04/15 0543 07/05/15 0612  WBC  --  17.4* 15.7* 16.3*  HGB  --  13.1 13.5 12.8*  PLT  --  134* 115* 120*  CREATININE 1.07 0.88 0.78  --    Estimated Creatinine Clearance: 94.9 mL/min (by C-G formula based on Cr of 0.78). No results for input(s): VANCOTROUGH, VANCOPEAK, VANCORANDOM, GENTTROUGH, GENTPEAK, GENTRANDOM, TOBRATROUGH, TOBRAPEAK, TOBRARND, AMIKACINPEAK, AMIKACINTROU, AMIKACIN in the last 72 hours.   Microbiology: Recent Results (from the past  720 hour(s))  Urine culture     Status: None   Collection Time: 06/18/15 11:51 AM  Result Value Ref Range Status   Specimen Description URINE, CLEAN CATCH  Final   Special Requests NONE  Final   Culture   Final    6,000 COLONIES/mL INSIGNIFICANT GROWTH Performed at [facility]    Report Status 06/19/2015 FINAL  Final  Blood culture (routine x 2)     Status: None   Collection Time: 06/18/15  2:09 PM  Result Value Ref Range Status   Specimen Description BLOOD LEFT ANTECUBITAL  Final   Special Requests   Final    BOTTLES DRAWN AEROBIC AND ANAEROBIC AEB=12CC ANA=10CC   Culture NO GROWTH 5 DAYS  Final   Report Status 06/23/2015 FINAL  Final  Blood culture (routine x 2)     Status: None   Collection Time: 06/18/15  2:16 PM  Result Value Ref Range Status   Specimen Description BLOOD RIGHT ANTECUBITAL  Final   Special Requests BOTTLES DRAWN AEROBIC AND ANAEROBIC 8CC EACH  Final   Culture NO GROWTH 5 DAYS  Final   Report Status 06/23/2015 FINAL  Final  Culture, blood (routine x 2)     Status: None (Preliminary result)   Collection Time: 07/02/15 12:09 PM  Result Value Ref Range Status   Specimen Description BLOOD LEFT HAND DRAWN BY RN EW  Final   Special Requests BOTTLES DRAWN AEROBIC AND ANAEROBIC 4CC EACH  Final   Culture NO GROWTH 2 DAYS  Final   Report Status PENDING  Incomplete  Culture,  blood (routine x 2)     Status: None (Preliminary result)   Collection Time: 07/02/15 12:12 PM  Result Value Ref Range Status   Specimen Description BLOOD RIGHT ANTECUBITAL  Final   Special Requests BOTTLES DRAWN AEROBIC AND ANAEROBIC [place]  Final   Culture NO GROWTH 2 DAYS  Final   Report Status PENDING  Incomplete  Urine culture     Status: None   Collection Time: 07/02/15  3:20 PM  Result Value Ref Range Status   Specimen Description URINE, CLEAN CATCH  Final   Special Requests NONE  Final   Culture   Final    1,000 COLONIES/mL INSIGNIFICANT GROWTH Performed at [facility]    Report Status 07/03/2015 FINAL  Final  Culture, blood (Routine X 2) w Reflex to ID Panel     Status: None (Preliminary result)   Collection Time: 07/03/15  7:30 PM  Result Value Ref Range Status   Specimen Description BLOOD LEFT ARM  Final   Special Requests BOTTLES DRAWN AEROBIC AND ANAEROBIC 6CC  Final   Culture NO GROWTH < 24 HOURS  Final   Report Status PENDING  Incomplete  Culture, blood (Routine X 2) w Reflex to ID Panel     Status: None (Preliminary result)   Collection Time: 07/03/15  7:33 PM  Result Value Ref Range Status   Specimen Description BLOOD LEFT ARM  Final   Special Requests BOTTLES DRAWN AEROBIC AND ANAEROBIC 6CC  Final   Culture NO GROWTH < 24 HOURS  Final   Report Status PENDING  Incomplete    Medical History: Past Medical History  Diagnosis Date  . Osteoarthritis, knee     s/p B TKA  . HIV infection ([place]) dx 1993  . TIA (transient ischemic attack) 1997    mild residual L mouth droop  . H/O hiatal hernia 2008    surgery  . Gynecomastia, male   . Impotence of organic origin   . Rheumatoid arthritis(714.0) dx 2010    MTX, follows with rheum  . Gout   . Chronic back pain     follows with Nsurg  . Seasonal allergies   . Diverticulosis   . Gallstones   . Status post dilation of esophageal narrowing   . GERD (gastroesophageal reflux disease)   . Diverticulosis   . Esophagitis   . Hemorrhoids   . Tubular adenoma of colon   . Myocardial infarction ([place]) 2010     x 2  . Hypertension   . DVT, lower extremity, recurrent ([place]) 2008, 2009    LLE, chronic anticoag since 2009  . Carotid artery occlusion     40-60% right ICA stenosis (09/2008)  . Neuromuscular disorder (HCC)     neuropathy  . Fibromyalgia   . Hepatitis A yrs ago  . CLL (chronic lymphoblastic leukemia) dx 2010    Followed at [facility] q28mo, no current therapy   . Secondary syphilis 07/24/14 dx    s/p 2 wks doxy  . Type II diabetes mellitus with manifestations ([place]) 04/02/2011  .  Coronary artery disease 2010    s/p CABG '10, sees Dr. Percival Spanish    Medications:  Scheduled:  . aspirin EC  81 mg Oral Daily  . atorvastatin  10 mg Oral q1800  . azaTHIOprine  50 mg Oral BID  . aztreonam  1 g Intravenous Q8H  . Darunavir Ethanolate  800 mg Oral Q breakfast  . elvitegravir-cobicistat-emtricitabine-tenofovir  1 tablet Oral Q breakfast  .  escitalopram  10 mg Oral Daily  . loratadine  10 mg Oral Daily  . metoprolol succinate  25 mg Oral QHS  . pantoprazole  40 mg Oral Daily  . pregabalin  200 mg Oral BID  . saccharomyces boulardii  250 mg Oral BID  . sodium chloride flush  3 mL Intravenous Q12H  . sodium chloride flush  3 mL Intravenous Q12H  . valACYclovir  500 mg Oral Daily  . vancomycin  1,000 mg Intravenous Q12H  . Warfarin - Pharmacist Dosing Inpatient   Does not apply Q24H   Assessment: 44 male recently DX with pneumonia and started on Levaquin and prednisone. Start Vancomycin & Azactam for HCAP, FUO  Goal of Therapy:  Vancomycin trough level 15-20 mcg/ml  Plan:  Cont Vancomycin 1 GM IV every 12 hours Cont Azactam 1 GM IV every 8 hours Vancomycin trough at steady state F/u renal function, cultures and clinical course  Ibtisam Benge Poteet 07/05/2015,8:55 AM  Addum:  INR is supratherapeutic today.  Will hold coumadin today

## 2015-07-05 NOTE — Progress Notes (Signed)
[facility] PROGRESS NOTE  [name] [id number] DOB: 04/17/1951 DOA: 07/02/2015 PCP: [name], MD  Assessment/Plan: FUO, recurrent/persistent pneumonia -Appears to be defervescing. -CT scan shows what appears to be residual pneumonia. -Case has been discussed via phone with Dr. Megan Salon, infectious diseases. -Recommendation to continue treatment for hospital-acquired pneumonia given his recent hospitalization with vancomycin and aztreonam for at least 5 days, recheck CD4 count and HIV viral load, okay to discontinue Bactrim given CD4 count of 1400 back in July 2016. -Patient feels improved today.  HIV -Continue antiretrovirals. -Check CD4 count and viral load.  Rheumatoid arthritis -Continue methotrexate   Code Status: Full code Family Communication: Patient only  Disposition Plan: Anticipate discharge home in 24-48 hours   Consultants:  Telephone consultation with infectious diseases, Dr. Megan Salon   Antibiotics:  Vancomycin  Aztreonam   Subjective: Feels improved today, less cough  Objective: Filed Vitals:   07/04/15 1503 07/04/15 1630 07/04/15 2137 07/05/15 0541  BP: 121/66  115/63 120/72  Pulse: 98  85 88  Temp: 100.7 F (38.2 C) 98.4 F (36.9 C) 98.6 F (37 C) 99.1 F (37.3 C)  TempSrc: Oral Oral Oral Oral  Resp: 20  20 20   Height:      Weight:      SpO2: 98%  94% 95%    Intake/Output Summary (Last 24 hours) at 07/05/15 1307 Last data filed at 07/05/15 0925  Gross per 24 hour  Intake   1310 ml  Output   1900 ml  Net   -590 ml   Filed Weights   07/02/15 1152 07/02/15 1654  Weight: 86.183 kg (190 lb) 88.2 kg (194 lb 7.1 oz)    Exam:   General:  Alert, awake, oriented 3  Cardiovascular: Regular rate and rhythm, no murmurs, rubs or gallops  Respiratory: Clear to auscultation bilaterally  Abdomen: Soft, nontender, nondistended, positive bowel sounds  Extremities: No clubbing, cyanosis or edema, positive pulses    Neurologic:  Grossly intact and nonfocal  Data Reviewed: Basic Metabolic Panel:  Recent Labs Lab 07/02/15 1209 07/02/15 1622 07/03/15 0553 07/04/15 0543  NA 133* 137 134* 134*  K 4.0 3.7 3.8 3.5  CL 100* 108 103 102  CO2 25 25 26 26   GLUCOSE 110* 106* 93 95  BUN 28* 23* 16 13  CREATININE 1.21 1.07 0.88 0.78  CALCIUM 7.8* 6.7* 7.2* 7.3*   Liver Function Tests:  Recent Labs Lab 07/02/15 1209  AST 41  ALT 35  ALKPHOS 60  BILITOT 1.0  PROT 5.4*  ALBUMIN 3.1*   No results for input(s): LIPASE, AMYLASE in the last 168 hours. No results for input(s): AMMONIA in the last 168 hours. CBC:  Recent Labs Lab 07/02/15 1209 07/03/15 0553 07/04/15 0543 07/05/15 0612  WBC 21.0* 17.4* 15.7* 16.3*  NEUTROABS 2.5  --   --   --   HGB 15.3 13.1 13.5 12.8*  HCT 45.6 40.2 40.3 37.6*  MCV 87.5 88.9 87.8 84.9  PLT 155 134* 115* 120*   Cardiac Enzymes: No results for input(s): CKTOTAL, CKMB, CKMBINDEX, TROPONINI in the last 168 hours. BNP (last 3 results)  Recent Labs  04/20/15 1153  BNP 164.7*    ProBNP (last 3 results)  Recent Labs  04/19/15 1717 04/26/15 1653  PROBNP 189.0* 182.0*    CBG: No results for input(s): GLUCAP in the last 168 hours.  Recent Results (from the past 240 hour(s))  Culture, blood (routine x 2)     Status: None (Preliminary  result)   Collection Time: 07/02/15 12:09 PM  Result Value Ref Range Status   Specimen Description BLOOD LEFT HAND DRAWN BY RN EW  Final   Special Requests BOTTLES DRAWN AEROBIC AND ANAEROBIC 4CC EACH  Final   Culture NO GROWTH 3 DAYS  Final   Report Status PENDING  Incomplete  Culture, blood (routine x 2)     Status: None (Preliminary result)   Collection Time: 07/02/15 12:12 PM  Result Value Ref Range Status   Specimen Description BLOOD RIGHT ANTECUBITAL  Final   Special Requests BOTTLES DRAWN AEROBIC AND ANAEROBIC [place]  Final   Culture NO GROWTH 3 DAYS  Final   Report Status PENDING  Incomplete  Urine  culture     Status: None   Collection Time: 07/02/15  3:20 PM  Result Value Ref Range Status   Specimen Description URINE, CLEAN CATCH  Final   Special Requests NONE  Final   Culture   Final    1,000 COLONIES/mL INSIGNIFICANT GROWTH Performed at [facility]    Report Status 07/03/2015 FINAL  Final  Culture, blood (Routine X 2) w Reflex to ID Panel     Status: None (Preliminary result)   Collection Time: 07/03/15  7:30 PM  Result Value Ref Range Status   Specimen Description BLOOD LEFT ARM  Final   Special Requests BOTTLES DRAWN AEROBIC AND ANAEROBIC 6CC  Final   Culture NO GROWTH 2 DAYS  Final   Report Status PENDING  Incomplete  Culture, blood (Routine X 2) w Reflex to ID Panel     Status: None (Preliminary result)   Collection Time: 07/03/15  7:33 PM  Result Value Ref Range Status   Specimen Description BLOOD LEFT ARM  Final   Special Requests BOTTLES DRAWN AEROBIC AND ANAEROBIC 6CC  Final   Culture NO GROWTH 2 DAYS  Final   Report Status PENDING  Incomplete     Studies: Ct Chest W Contrast  07/04/2015  CLINICAL DATA:  Fever of unknown origin. Fatigue for past 2 days. HIV. Chronic lymphoblastic leukemia. EXAM: CT CHEST, ABDOMEN, AND PELVIS WITH CONTRAST TECHNIQUE: Multidetector CT imaging of the chest, abdomen and pelvis was performed following the standard protocol during bolus administration of intravenous contrast. CONTRAST:  124mL OMNIPAQUE IOHEXOL 300 MG/ML  SOLN COMPARISON:  Chest CT on 06/19/2015 and AP CT on 01/17/2015 FINDINGS: CT CHEST FINDINGS Mediastinum/Lymph Nodes: Mild mediastinal and bilateral hilar lymphadenopathy with punctate calcifications shows no significant change compared to most recent chest CT. Index lesion in subcarinal region measures 2.2 cm on image 32/series 2 is stable. No new or increased areas of lymphadenopathy are identified within the thorax. A small hiatal hernia is again demonstrated. Lungs/Pleura: Patchy bilateral ground-glass pulmonary  opacity with upper lobe predominance shows interval worsening since previous study. No distinct pulmonary masses are identified. A small left pleural effusion is new since prior exam. Musculoskeletal: No chest wall mass or suspicious bone lesions identified. CT ABDOMEN PELVIS FINDINGS Hepatobiliary: No masses or other significant abnormality. Gallbladder is unremarkable. Pancreas: No mass, inflammatory changes, or other significant abnormality. Spleen: Within normal limits in size and appearance. Adrenals/Urinary Tract: No masses identified. No evidence of hydronephrosis. Stomach/Bowel: No evidence of obstruction, inflammatory process, or abnormal fluid collections. Sigmoid diverticulosis is demonstrated, however there is no evidence of diverticulitis. Vascular/Lymphatic: Retroperitoneal lymphadenopathy is seen in aorta caval and left paraaortic spaces. This shows no significant change, with index lymph node on image 74 measuring 2.1 cm compared to 2.2 cm  previously. No pathologically enlarged lymph nodes identified within the pelvis. Reproductive: No mass or other significant abnormality. Other: None. Musculoskeletal: No suspicious bone lesions identified. Lumbar spine fusion hardware again noted. IMPRESSION: No significant change in thoracic and abdominal retroperitoneal lymphadenopathy since prior exam. Interval worsening of bilateral heterogeneous airspace disease with upper lobe predominance, nonspecific but could be due to PCP pneumonia in the setting of HIV. New small left pleural effusion also noted. Stable small hiatal hernia. Colonic diverticulosis. No radiographic evidence of diverticulitis. Electronically Signed   By: Earle Gell M.D.   On: 07/04/2015 18:25   Ct Abdomen Pelvis W Contrast  07/04/2015  CLINICAL DATA:  Fever of unknown origin. Fatigue for past 2 days. HIV. Chronic lymphoblastic leukemia. EXAM: CT CHEST, ABDOMEN, AND PELVIS WITH CONTRAST TECHNIQUE: Multidetector CT imaging of the chest,  abdomen and pelvis was performed following the standard protocol during bolus administration of intravenous contrast. CONTRAST:  183mL OMNIPAQUE IOHEXOL 300 MG/ML  SOLN COMPARISON:  Chest CT on 06/19/2015 and AP CT on 01/17/2015 FINDINGS: CT CHEST FINDINGS Mediastinum/Lymph Nodes: Mild mediastinal and bilateral hilar lymphadenopathy with punctate calcifications shows no significant change compared to most recent chest CT. Index lesion in subcarinal region measures 2.2 cm on image 32/series 2 is stable. No new or increased areas of lymphadenopathy are identified within the thorax. A small hiatal hernia is again demonstrated. Lungs/Pleura: Patchy bilateral ground-glass pulmonary opacity with upper lobe predominance shows interval worsening since previous study. No distinct pulmonary masses are identified. A small left pleural effusion is new since prior exam. Musculoskeletal: No chest wall mass or suspicious bone lesions identified. CT ABDOMEN PELVIS FINDINGS Hepatobiliary: No masses or other significant abnormality. Gallbladder is unremarkable. Pancreas: No mass, inflammatory changes, or other significant abnormality. Spleen: Within normal limits in size and appearance. Adrenals/Urinary Tract: No masses identified. No evidence of hydronephrosis. Stomach/Bowel: No evidence of obstruction, inflammatory process, or abnormal fluid collections. Sigmoid diverticulosis is demonstrated, however there is no evidence of diverticulitis. Vascular/Lymphatic: Retroperitoneal lymphadenopathy is seen in aorta caval and left paraaortic spaces. This shows no significant change, with index lymph node on image 74 measuring 2.1 cm compared to 2.2 cm previously. No pathologically enlarged lymph nodes identified within the pelvis. Reproductive: No mass or other significant abnormality. Other: None. Musculoskeletal: No suspicious bone lesions identified. Lumbar spine fusion hardware again noted. IMPRESSION: No significant change in thoracic  and abdominal retroperitoneal lymphadenopathy since prior exam. Interval worsening of bilateral heterogeneous airspace disease with upper lobe predominance, nonspecific but could be due to PCP pneumonia in the setting of HIV. New small left pleural effusion also noted. Stable small hiatal hernia. Colonic diverticulosis. No radiographic evidence of diverticulitis. Electronically Signed   By: Earle Gell M.D.   On: 07/04/2015 18:25    Scheduled Meds: . aspirin EC  81 mg Oral Daily  . atorvastatin  10 mg Oral q1800  . azaTHIOprine  50 mg Oral BID  . aztreonam  1 g Intravenous Q8H  . Darunavir Ethanolate  800 mg Oral Q breakfast  . elvitegravir-cobicistat-emtricitabine-tenofovir  1 tablet Oral Q breakfast  . escitalopram  10 mg Oral Daily  . loratadine  10 mg Oral Daily  . metoprolol succinate  25 mg Oral QHS  . pantoprazole  40 mg Oral Daily  . pregabalin  200 mg Oral BID  . saccharomyces boulardii  250 mg Oral BID  . sodium chloride flush  3 mL Intravenous Q12H  . sodium chloride flush  3 mL Intravenous Q12H  . valACYclovir  500 mg Oral Daily  . vancomycin  1,000 mg Intravenous Q12H  . Warfarin - Pharmacist Dosing Inpatient   Does not apply Q24H   Continuous Infusions:   Principal Problem:   Fever Active Problems:   HIV disease (HCC)   Rheumatoid arthritis ([place])   Chronic lymphoblastic leukemia ([place])   Postinflammatory pulmonary fibrosis ([place])    Time spent: 25 minutes. Greater than 50% of this time was spent in direct contact with the patient coordinating care.    Lelon Frohlich  [facility] Pager [phone]  If 7PM-7AM, please contact night-coverage at [email or web address], password [facility] 07/05/2015, 1:07 PM  LOS: 3 days

## 2015-07-06 LAB — CBC
HCT: 37.3 % — ABNORMAL LOW (ref 39.0–52.0)
Hemoglobin: 12.6 g/dL — ABNORMAL LOW (ref 13.0–17.0)
MCH: 28.7 pg (ref 26.0–34.0)
MCHC: 33.8 g/dL (ref 30.0–36.0)
MCV: 85 fL (ref 78.0–100.0)
Platelets: 130 10*3/uL — ABNORMAL LOW (ref 150–400)
RBC: 4.39 MIL/uL (ref 4.22–5.81)
RDW: 18.9 % — ABNORMAL HIGH (ref 11.5–15.5)
WBC: 17.3 10*3/uL — ABNORMAL HIGH (ref 4.0–10.5)

## 2015-07-06 LAB — BASIC METABOLIC PANEL
Anion gap: 7 (ref 5–15)
BUN: 12 mg/dL (ref 6–20)
CO2: 21 mmol/L — ABNORMAL LOW (ref 22–32)
Calcium: 7.2 mg/dL — ABNORMAL LOW (ref 8.9–10.3)
Chloride: 102 mmol/L (ref 101–111)
Creatinine, Ser: 0.8 mg/dL (ref 0.61–1.24)
GFR calc Af Amer: >60 mL/min (ref >60)
GFR calc non Af Amer: >60 mL/min (ref >60)
Glucose, Bld: 96 mg/dL (ref 65–99)
Potassium: 3.7 mmol/L (ref 3.5–5.1)
Sodium: 130 mmol/L — ABNORMAL LOW (ref 135–145)

## 2015-07-06 LAB — PROTIME-INR
INR: 3.93 — ABNORMAL HIGH (ref 0.00–1.49)
Prothrombin Time: 37.5 seconds — ABNORMAL HIGH (ref 11.6–15.2)

## 2015-07-06 MED ORDER — IBUPROFEN 400 MG PO TABS
400.0000 mg | ORAL_TABLET | Freq: Four times a day (QID) | ORAL | Status: DC | PRN
  Administered 2015-07-06 – 2015-07-09 (×9): 400 mg via ORAL
  Filled 2015-07-06 (×9): qty 1

## 2015-07-06 MED ORDER — MENTHOL 3 MG MT LOZG
LOZENGE | OROMUCOSAL | Status: AC
  Filled 2015-07-06: qty 9

## 2015-07-06 NOTE — Progress Notes (Signed)
[facility] PROGRESS NOTE  [name] [id number] DOB: November 11, 1950 DOA: 07/02/2015 PCP: [name], MD  Assessment/Plan: FUO, recurrent/persistent pneumonia -Appears to be defervescing. -CT scan shows what appears to be residual pneumonia. -Case has been discussed via phone with Dr. Megan Salon, infectious diseases. -Recommendation to continue treatment for hospital-acquired pneumonia given his recent hospitalization with vancomycin and aztreonam for at least 5 days, recheck CD4 count and HIV viral load, okay to discontinue Bactrim given CD4 count of 1400 back in July 2016. -Was febrile this a.m. with a MAXIMUM TEMPERATURE of 101.6.  HIV -Continue antiretrovirals. -Check CD4 count and viral load.  Rheumatoid arthritis -Continue methotrexate   Code Status: Full code Family Communication: Patient only  Disposition Plan: Anticipate discharge home in 48-72 hours   Consultants:  Telephone consultation with infectious diseases, Dr. Megan Salon   Antibiotics:  Vancomycin  Aztreonam   Subjective: Feels improved today, less cough, has a headache  Objective: Filed Vitals:   07/06/15 0500 07/06/15 0545 07/06/15 1121 07/06/15 1350  BP: 125/70  126/73 128/68  Pulse: 103  95 91  Temp: 101.3 F (38.5 C)  98.7 F (37.1 C) 98.5 F (36.9 C)  TempSrc: Oral  Oral Oral  Resp: 22   18  Height:      Weight:      SpO2:  92% 95% 97%    Intake/Output Summary (Last 24 hours) at 07/06/15 1607 Last data filed at 07/06/15 1300  Gross per 24 hour  Intake    726 ml  Output   1100 ml  Net   -374 ml   Filed Weights   07/02/15 1152 07/02/15 1654  Weight: 86.183 kg (190 lb) 88.2 kg (194 lb 7.1 oz)    Exam:   General:  Alert, awake, oriented 3  Cardiovascular: Regular rate and rhythm, no murmurs, rubs or gallops  Respiratory: Mild bibasilar rhonchi  Abdomen: Soft, nontender, nondistended, positive bowel sounds  Extremities: No clubbing, cyanosis or edema,  positive pulses   Neurologic:  Grossly intact and nonfocal  Data Reviewed: Basic Metabolic Panel:  Recent Labs Lab 07/02/15 1209 07/02/15 1622 07/03/15 0553 07/04/15 0543 07/06/15 0648  NA 133* 137 134* 134* 130*  K 4.0 3.7 3.8 3.5 3.7  CL 100* 108 103 102 102  CO2 25 25 26 26  21*  GLUCOSE 110* 106* 93 95 96  BUN 28* 23* 16 13 12   CREATININE 1.21 1.07 0.88 0.78 0.80  CALCIUM 7.8* 6.7* 7.2* 7.3* 7.2*   Liver Function Tests:  Recent Labs Lab 07/02/15 1209  AST 41  ALT 35  ALKPHOS 60  BILITOT 1.0  PROT 5.4*  ALBUMIN 3.1*   No results for input(s): LIPASE, AMYLASE in the last 168 hours. No results for input(s): AMMONIA in the last 168 hours. CBC:  Recent Labs Lab 07/02/15 1209 07/03/15 0553 07/04/15 0543 07/05/15 0612 07/06/15 0648  WBC 21.0* 17.4* 15.7* 16.3* 17.3*  NEUTROABS 2.5  --   --   --   --   HGB 15.3 13.1 13.5 12.8* 12.6*  HCT 45.6 40.2 40.3 37.6* 37.3*  MCV 87.5 88.9 87.8 84.9 85.0  PLT 155 134* 115* 120* 130*   Cardiac Enzymes: No results for input(s): CKTOTAL, CKMB, CKMBINDEX, TROPONINI in the last 168 hours. BNP (last 3 results)  Recent Labs  04/20/15 1153  BNP 164.7*    ProBNP (last 3 results)  Recent Labs  04/19/15 1717 04/26/15 1653  PROBNP 189.0* 182.0*    CBG: No results for input(s): GLUCAP  in the last 168 hours.  Recent Results (from the past 240 hour(s))  Culture, blood (routine x 2)     Status: None (Preliminary result)   Collection Time: 07/02/15 12:09 PM  Result Value Ref Range Status   Specimen Description BLOOD LEFT HAND DRAWN BY RN EW  Final   Special Requests BOTTLES DRAWN AEROBIC AND ANAEROBIC 4CC EACH  Final   Culture NO GROWTH 4 DAYS  Final   Report Status PENDING  Incomplete  Culture, blood (routine x 2)     Status: None (Preliminary result)   Collection Time: 07/02/15 12:12 PM  Result Value Ref Range Status   Specimen Description BLOOD RIGHT ANTECUBITAL  Final   Special Requests BOTTLES DRAWN AEROBIC  AND ANAEROBIC [place]  Final   Culture NO GROWTH 4 DAYS  Final   Report Status PENDING  Incomplete  Urine culture     Status: None   Collection Time: 07/02/15  3:20 PM  Result Value Ref Range Status   Specimen Description URINE, CLEAN CATCH  Final   Special Requests NONE  Final   Culture   Final    1,000 COLONIES/mL INSIGNIFICANT GROWTH Performed at [facility]    Report Status 07/03/2015 FINAL  Final  Culture, blood (Routine X 2) w Reflex to ID Panel     Status: None (Preliminary result)   Collection Time: 07/03/15  7:30 PM  Result Value Ref Range Status   Specimen Description BLOOD LEFT ARM  Final   Special Requests BOTTLES DRAWN AEROBIC AND ANAEROBIC 6CC  Final   Culture NO GROWTH 3 DAYS  Final   Report Status PENDING  Incomplete  Culture, blood (Routine X 2) w Reflex to ID Panel     Status: None (Preliminary result)   Collection Time: 07/03/15  7:33 PM  Result Value Ref Range Status   Specimen Description BLOOD LEFT ARM  Final   Special Requests BOTTLES DRAWN AEROBIC AND ANAEROBIC 6CC  Final   Culture NO GROWTH 3 DAYS  Final   Report Status PENDING  Incomplete     Studies: Ct Chest W Contrast  07/04/2015  CLINICAL DATA:  Fever of unknown origin. Fatigue for past 2 days. HIV. Chronic lymphoblastic leukemia. EXAM: CT CHEST, ABDOMEN, AND PELVIS WITH CONTRAST TECHNIQUE: Multidetector CT imaging of the chest, abdomen and pelvis was performed following the standard protocol during bolus administration of intravenous contrast. CONTRAST:  164mL OMNIPAQUE IOHEXOL 300 MG/ML  SOLN COMPARISON:  Chest CT on 06/19/2015 and AP CT on 01/17/2015 FINDINGS: CT CHEST FINDINGS Mediastinum/Lymph Nodes: Mild mediastinal and bilateral hilar lymphadenopathy with punctate calcifications shows no significant change compared to most recent chest CT. Index lesion in subcarinal region measures 2.2 cm on image 32/series 2 is stable. No new or increased areas of lymphadenopathy are identified within the  thorax. A small hiatal hernia is again demonstrated. Lungs/Pleura: Patchy bilateral ground-glass pulmonary opacity with upper lobe predominance shows interval worsening since previous study. No distinct pulmonary masses are identified. A small left pleural effusion is new since prior exam. Musculoskeletal: No chest wall mass or suspicious bone lesions identified. CT ABDOMEN PELVIS FINDINGS Hepatobiliary: No masses or other significant abnormality. Gallbladder is unremarkable. Pancreas: No mass, inflammatory changes, or other significant abnormality. Spleen: Within normal limits in size and appearance. Adrenals/Urinary Tract: No masses identified. No evidence of hydronephrosis. Stomach/Bowel: No evidence of obstruction, inflammatory process, or abnormal fluid collections. Sigmoid diverticulosis is demonstrated, however there is no evidence of diverticulitis. Vascular/Lymphatic: Retroperitoneal lymphadenopathy is seen  in aorta caval and left paraaortic spaces. This shows no significant change, with index lymph node on image 74 measuring 2.1 cm compared to 2.2 cm previously. No pathologically enlarged lymph nodes identified within the pelvis. Reproductive: No mass or other significant abnormality. Other: None. Musculoskeletal: No suspicious bone lesions identified. Lumbar spine fusion hardware again noted. IMPRESSION: No significant change in thoracic and abdominal retroperitoneal lymphadenopathy since prior exam. Interval worsening of bilateral heterogeneous airspace disease with upper lobe predominance, nonspecific but could be due to PCP pneumonia in the setting of HIV. New small left pleural effusion also noted. Stable small hiatal hernia. Colonic diverticulosis. No radiographic evidence of diverticulitis. Electronically Signed   By: Earle Gell M.D.   On: 07/04/2015 18:25   Ct Abdomen Pelvis W Contrast  07/04/2015  CLINICAL DATA:  Fever of unknown origin. Fatigue for past 2 days. HIV. Chronic lymphoblastic  leukemia. EXAM: CT CHEST, ABDOMEN, AND PELVIS WITH CONTRAST TECHNIQUE: Multidetector CT imaging of the chest, abdomen and pelvis was performed following the standard protocol during bolus administration of intravenous contrast. CONTRAST:  151mL OMNIPAQUE IOHEXOL 300 MG/ML  SOLN COMPARISON:  Chest CT on 06/19/2015 and AP CT on 01/17/2015 FINDINGS: CT CHEST FINDINGS Mediastinum/Lymph Nodes: Mild mediastinal and bilateral hilar lymphadenopathy with punctate calcifications shows no significant change compared to most recent chest CT. Index lesion in subcarinal region measures 2.2 cm on image 32/series 2 is stable. No new or increased areas of lymphadenopathy are identified within the thorax. A small hiatal hernia is again demonstrated. Lungs/Pleura: Patchy bilateral ground-glass pulmonary opacity with upper lobe predominance shows interval worsening since previous study. No distinct pulmonary masses are identified. A small left pleural effusion is new since prior exam. Musculoskeletal: No chest wall mass or suspicious bone lesions identified. CT ABDOMEN PELVIS FINDINGS Hepatobiliary: No masses or other significant abnormality. Gallbladder is unremarkable. Pancreas: No mass, inflammatory changes, or other significant abnormality. Spleen: Within normal limits in size and appearance. Adrenals/Urinary Tract: No masses identified. No evidence of hydronephrosis. Stomach/Bowel: No evidence of obstruction, inflammatory process, or abnormal fluid collections. Sigmoid diverticulosis is demonstrated, however there is no evidence of diverticulitis. Vascular/Lymphatic: Retroperitoneal lymphadenopathy is seen in aorta caval and left paraaortic spaces. This shows no significant change, with index lymph node on image 74 measuring 2.1 cm compared to 2.2 cm previously. No pathologically enlarged lymph nodes identified within the pelvis. Reproductive: No mass or other significant abnormality. Other: None. Musculoskeletal: No suspicious  bone lesions identified. Lumbar spine fusion hardware again noted. IMPRESSION: No significant change in thoracic and abdominal retroperitoneal lymphadenopathy since prior exam. Interval worsening of bilateral heterogeneous airspace disease with upper lobe predominance, nonspecific but could be due to PCP pneumonia in the setting of HIV. New small left pleural effusion also noted. Stable small hiatal hernia. Colonic diverticulosis. No radiographic evidence of diverticulitis. Electronically Signed   By: Earle Gell M.D.   On: 07/04/2015 18:25    Scheduled Meds: . aspirin EC  81 mg Oral Daily  . atorvastatin  10 mg Oral q1800  . azaTHIOprine  50 mg Oral BID  . aztreonam  1 g Intravenous Q8H  . Darunavir Ethanolate  800 mg Oral Q breakfast  . elvitegravir-cobicistat-emtricitabine-tenofovir  1 tablet Oral Q breakfast  . escitalopram  10 mg Oral Daily  . loratadine  10 mg Oral Daily  . metoprolol succinate  25 mg Oral QHS  . pantoprazole  40 mg Oral Daily  . pregabalin  200 mg Oral BID  . saccharomyces boulardii  250  mg Oral BID  . sodium chloride flush  3 mL Intravenous Q12H  . sodium chloride flush  3 mL Intravenous Q12H  . valACYclovir  500 mg Oral Daily  . vancomycin  1,000 mg Intravenous Q12H  . Warfarin - Pharmacist Dosing Inpatient   Does not apply Q24H   Continuous Infusions:   Principal Problem:   Fever Active Problems:   HIV disease (HCC)   Rheumatoid arthritis ([place])   Chronic lymphoblastic leukemia ([place])   Postinflammatory pulmonary fibrosis ([place])    Time spent: 25 minutes. Greater than 50% of this time was spent in direct contact with the patient coordinating care.    Lelon Frohlich  [facility] Pager [phone]  If 7PM-7AM, please contact night-coverage at [email or web address], password [facility] 07/06/2015, 4:07 PM  LOS: 4 days

## 2015-07-06 NOTE — Progress Notes (Signed)
ANTICOAGULATION CONSULT NOTE - follow up  Pharmacy Consult for coumadin Indication: DVT  Allergies  Allergen Reactions  . Morphine Other (See Comments)    REACTION: severe headache  . Other Anaphylaxis and Hives    Pecan  . Peanut-Containing Drug Products Anaphylaxis and Hives    Swelling of throat  . Oxycodone-Acetaminophen Other (See Comments)    REACTION: headache  . Penicillins Rash    Has patient had a PCN reaction causing immediate rash, facial/tongue/throat swelling, SOB or lightheadedness with hypotension: Yes Has patient had a PCN reaction causing severe rash involving mucus membranes or skin necrosis: No Has patient had a PCN reaction that required hospitalization No Has patient had a PCN reaction occurring within the last 10 years: No If all of the above answers are "NO", then may proceed with Cephalosporin use.   REACTION: red, flushed  . Promethazine Hcl Other (See Comments)    REACTION: makes him feel drunk   Patient Measurements: Height: 5\' 7"  (170.2 cm) Weight: 194 lb 7.1 oz (88.2 kg) IBW/kg (Calculated) : 66.1  Vital Signs: Temp: 101.3 F (38.5 C) (02/24 0500) Temp Source: Oral (02/24 0500) BP: 125/70 mmHg (02/24 0500) Pulse Rate: 103 (02/24 0500)  Labs:  Recent Labs  07/04/15 0543 07/05/15 0612 07/06/15 0648  HGB 13.5 12.8* 12.6*  HCT 40.3 37.6* 37.3*  PLT 115* 120* 130*  LABPROT 20.6* 33.4* 37.5*  INR 1.77* 3.37* 3.93*  CREATININE 0.78  --  0.80   Estimated Creatinine Clearance: 94.9 mL/min (by C-G formula based on Cr of 0.8).  Medical History: Past Medical History  Diagnosis Date  . Osteoarthritis, knee     s/p B TKA  . HIV infection ([place]) dx 1993  . TIA (transient ischemic attack) 1997    mild residual L mouth droop  . H/O hiatal hernia 2008    surgery  . Gynecomastia, male   . Impotence of organic origin   . Rheumatoid arthritis(714.0) dx 2010    MTX, follows with rheum  . Gout   . Chronic back pain     follows with Nsurg   . Seasonal allergies   . Diverticulosis   . Gallstones   . Status post dilation of esophageal narrowing   . GERD (gastroesophageal reflux disease)   . Diverticulosis   . Esophagitis   . Hemorrhoids   . Tubular adenoma of colon   . Myocardial infarction ([place]) 2010     x 2  . Hypertension   . DVT, lower extremity, recurrent ([place]) 2008, 2009    LLE, chronic anticoag since 2009  . Carotid artery occlusion     40-60% right ICA stenosis (09/2008)  . Neuromuscular disorder (HCC)     neuropathy  . Fibromyalgia   . Hepatitis A yrs ago  . CLL (chronic lymphoblastic leukemia) dx 2010    Followed at [facility] q40mo, no current therapy   . Secondary syphilis 07/24/14 dx    s/p 2 wks doxy  . Type II diabetes mellitus with manifestations ([place]) 04/02/2011  . Coronary artery disease 2010    s/p CABG '10, sees Dr. Percival Spanish   Medications:  Prescriptions prior to admission  Medication Sig Dispense Refill Last Dose  . atorvastatin (LIPITOR) 10 MG tablet Take 1 tablet (10 mg total) by mouth at bedtime. (Patient taking differently: Take 10 mg by mouth every morning. ) 90 tablet 3 07/02/2015 at Unknown time  . escitalopram (LEXAPRO) 10 MG tablet Take 10 mg by mouth daily.   07/02/2015 at Unknown  time  . furosemide (LASIX) 20 MG tablet Take 1 tablet (20 mg total) by mouth 2 (two) times daily. (Patient taking differently: Take 20 mg by mouth every evening. ) 60 tablet 5 07/02/2015 at Unknown time  . GENVOYA 150-150-200-10 MG TABS tablet TAKE 1 TABLET BY MOUTH DAILY WITH BREAKFAST. 30 tablet 5 07/02/2015 at Unknown time  . levofloxacin (LEVAQUIN) 750 MG tablet Take 1 tablet (750 mg total) by mouth daily. 20 tablet 0 07/02/2015 at Unknown time  . loratadine (CLARITIN) 10 MG tablet Take 1 tablet (10 mg total) by mouth daily. 90 tablet 3 07/02/2015 at Unknown time  . LYRICA 200 MG capsule TAKE 1 CAPSULE BY MOUTH TWICE DAILY 60 capsule 5 07/02/2015 at Unknown time  . metoprolol succinate (TOPROL-XL) 25 MG 24 hr tablet  Take 1 tablet (25 mg total) by mouth at bedtime. 30 tablet 11 07/02/2015 at 0830  . pantoprazole (PROTONIX) 40 MG tablet TAKE 1 TABLET EVERY DAY 60 tablet 0 07/02/2015 at Unknown time  . PREZISTA 800 MG tablet TAKE 1 TABLET (800 MG TOTAL) BY MOUTH DAILY. 30 tablet 5 07/02/2015 at Unknown time  . saccharomyces boulardii (FLORASTOR) 250 MG capsule Take 1 capsule (250 mg total) by mouth 2 (two) times daily. 60 capsule 1 07/02/2015 at Unknown time  . urea (CARMOL) 10 % cream APPLY TOPICALLY AS NEEDED. (Patient taking differently: APPLY TOPICALLY AS NEEDED -APPLIED TO BUTTOCKS) 85 g 0 07/01/2015 at Unknown time  . valACYclovir (VALTREX) 500 MG tablet TAKE 1 TABLET BY MOUTH EVERY DAY 90 tablet 3 07/02/2015 at Unknown time  . warfarin (COUMADIN) 10 MG tablet TAKE 1/2 ON WEDNESDAY AND 1 TABLET ALL OTHER DAYS (Patient taking differently: TAKE 10MG  ON WEDNESDAYS AND SATURDAYS, THEN TAKE 5MG  ON ALL OTHER DAYS) 60 tablet 3 07/01/2015 at Unknown time  . zolpidem (AMBIEN) 5 MG tablet TAKE 1 TABLET BY MOUTH AT BEDTIME AS NEEDED FOR SLEEP 30 tablet 2 07/01/2015 at Unknown time  . aspirin EC 81 MG tablet Take 81 mg by mouth daily.   Taking  . azaTHIOprine (IMURAN) 50 MG tablet Take 50 mg by mouth 2 (two) times daily.  3 Taking  . EPINEPHrine (EPIPEN 2-PAK) 0.3 mg/0.3 mL IJ SOAJ injection Inject 0.3 mLs (0.3 mg total) into the muscle once. 2 Device 0 Taking  . escitalopram (LEXAPRO) 10 MG tablet Take 1 tablet (10 mg total) by mouth daily. 90 tablet 1 06/18/2015 at Unknown time  . ibuprofen (ADVIL,MOTRIN) 200 MG tablet Take 200 mg by mouth every 6 (six) hours as needed for headache.   Taking  . inFLIXimab (REMICADE) 100 MG injection Inject into the vein every 6 (six) weeks.    Taking  . predniSONE (DELTASONE) 20 MG tablet Take 2 tablets (40 mg total) by mouth daily with breakfast. (Patient not taking: Reported on 07/02/2015) 14 tablet 0 Not Taking at Unknown time   Assessment: 65 yo man to continue coumadin for h/o DVT.  INR  SUPRAtherapeutic and rising. Platelets are improving, H/H stable.  No bleeding reported.  Goal of Therapy:  INR 2-3 Monitor platelets by anticoagulation protocol: Yes   Plan:  Continue to HOLD warfarin / coumadin Daily PT/INR Monitor for bleeding complications  Thanks for allowing pharmacy to be a part of this patient's care.  Hart Robinsons, PharmD Clinical Pharmacist 07/06/2015,11:47 AM

## 2015-07-06 NOTE — Progress Notes (Signed)
Nurse called to assess pt due decreased spo2 81% on 2lpm cann let head of bed up increased o2 4lpm cann spo2 88%.. bs wheezes on right and crackles.Marland Kitchenoxygen changed to HFNC 8lpm cann at 0542 spo2 increased to 92%

## 2015-07-06 NOTE — Progress Notes (Signed)
Pt temp was 101.6 - Motron given.  O2 was 88 - resp called.  Pt requested cepacol for soar throat.  Pt stated that he spit up small amt of blood.  Md informed.

## 2015-07-07 LAB — HIV-1 RNA QUANT-NO REFLEX-BLD
HIV 1 RNA Quant: 100 copies/mL
LOG10 HIV-1 RNA: 2 log10copy/mL

## 2015-07-07 LAB — PROTIME-INR
INR: 3.17 — ABNORMAL HIGH (ref 0.00–1.49)
Prothrombin Time: 31.9 seconds — ABNORMAL HIGH (ref 11.6–15.2)

## 2015-07-07 LAB — VANCOMYCIN, TROUGH: Vancomycin Tr: 12 ug/mL (ref 10.0–20.0)

## 2015-07-07 MED ORDER — IBUPROFEN 600 MG PO TABS
600.0000 mg | ORAL_TABLET | ORAL | Status: AC
  Administered 2015-07-07: 600 mg via ORAL
  Filled 2015-07-07: qty 1

## 2015-07-07 MED ORDER — VANCOMYCIN HCL 10 G IV SOLR
1250.0000 mg | Freq: Two times a day (BID) | INTRAVENOUS | Status: DC
  Administered 2015-07-07 – 2015-07-08 (×2): 1250 mg via INTRAVENOUS
  Filled 2015-07-07 (×5): qty 1250

## 2015-07-07 NOTE — Progress Notes (Signed)
[facility] PROGRESS NOTE  [name] [id number] DOB: 1951/03/12 DOA: 07/02/2015 PCP: [name], MD  Assessment/Plan: Recurrent/persistent pneumonia -Appears to be defervescing. -CT scan shows what appears to be residual pneumonia. -Case has been discussed via phone with Dr. Megan Salon, infectious diseases. -Recommendation to continue treatment for hospital-acquired pneumonia given his recent hospitalization with vancomycin and aztreonam for at least 5 days, recheck CD4 count and HIV viral load, okay to discontinue Bactrim given CD4 count of 1400 back in July 2016. -Last fever was in a.m. of 2/24 with a MAXIMUM TEMPERATURE of 101.6.  HIV -Continue antiretrovirals. -Check CD4 count and viral load.  Rheumatoid arthritis -Continue methotrexate   Code Status: Full code Family Communication: Patient only  Disposition Plan: Anticipate discharge home in 24 hours if remains febrile overnight   Consultants:  Telephone consultation with infectious diseases, Dr. Megan Salon   Antibiotics:  Vancomycin  Aztreonam   Subjective: Feels improved today, less cough, has a headache  Objective: Filed Vitals:   07/06/15 1121 07/06/15 1350 07/06/15 2042 07/07/15 0532  BP: 126/73 128/68  133/71  Pulse: 95 91  93  Temp: 98.7 F (37.1 C) 98.5 F (36.9 C) 99.3 F (37.4 C) 99.9 F (37.7 C)  TempSrc: Oral Oral Oral Oral  Resp:  18  18  Height:      Weight:      SpO2: 95% 97%  94%    Intake/Output Summary (Last 24 hours) at 07/07/15 1633 Last data filed at 07/07/15 [id number]  Gross per 24 hour  Intake   1443 ml  Output    975 ml  Net    468 ml   Filed Weights   07/02/15 1152 07/02/15 1654  Weight: 86.183 kg (190 lb) 88.2 kg (194 lb 7.1 oz)    Exam:   General:  Alert, awake, oriented 3  Cardiovascular: Regular rate and rhythm, no murmurs, rubs or gallops  Respiratory: Mild bibasilar rhonchi  Abdomen: Soft, nontender, nondistended, positive bowel  sounds  Extremities: No clubbing, cyanosis or edema, positive pulses   Neurologic:  Grossly intact and nonfocal  Data Reviewed: Basic Metabolic Panel:  Recent Labs Lab 07/02/15 1209 07/02/15 1622 07/03/15 0553 07/04/15 0543 07/06/15 0648  NA 133* 137 134* 134* 130*  K 4.0 3.7 3.8 3.5 3.7  CL 100* 108 103 102 102  CO2 25 25 26 26  21*  GLUCOSE 110* 106* 93 95 96  BUN 28* 23* 16 13 12   CREATININE 1.21 1.07 0.88 0.78 0.80  CALCIUM 7.8* 6.7* 7.2* 7.3* 7.2*   Liver Function Tests:  Recent Labs Lab 07/02/15 1209  AST 41  ALT 35  ALKPHOS 60  BILITOT 1.0  PROT 5.4*  ALBUMIN 3.1*   No results for input(s): LIPASE, AMYLASE in the last 168 hours. No results for input(s): AMMONIA in the last 168 hours. CBC:  Recent Labs Lab 07/02/15 1209 07/03/15 0553 07/04/15 0543 07/05/15 0612 07/06/15 0648  WBC 21.0* 17.4* 15.7* 16.3* 17.3*  NEUTROABS 2.5  --   --   --   --   HGB 15.3 13.1 13.5 12.8* 12.6*  HCT 45.6 40.2 40.3 37.6* 37.3*  MCV 87.5 88.9 87.8 84.9 85.0  PLT 155 134* 115* 120* 130*   Cardiac Enzymes: No results for input(s): CKTOTAL, CKMB, CKMBINDEX, TROPONINI in the last 168 hours. BNP (last 3 results)  Recent Labs  04/20/15 1153  BNP 164.7*    ProBNP (last 3 results)  Recent Labs  04/19/15 1717 04/26/15 1653  PROBNP 189.0*  182.0*    CBG: No results for input(s): GLUCAP in the last 168 hours.  Recent Results (from the past 240 hour(s))  Culture, blood (routine x 2)     Status: None (Preliminary result)   Collection Time: 07/02/15 12:09 PM  Result Value Ref Range Status   Specimen Description BLOOD LEFT HAND DRAWN BY RN EW  Final   Special Requests BOTTLES DRAWN AEROBIC AND ANAEROBIC 4CC EACH  Final   Culture NO GROWTH 4 DAYS  Final   Report Status PENDING  Incomplete  Culture, blood (routine x 2)     Status: None (Preliminary result)   Collection Time: 07/02/15 12:12 PM  Result Value Ref Range Status   Specimen Description BLOOD RIGHT  ANTECUBITAL  Final   Special Requests BOTTLES DRAWN AEROBIC AND ANAEROBIC [place]  Final   Culture NO GROWTH 4 DAYS  Final   Report Status PENDING  Incomplete  Urine culture     Status: None   Collection Time: 07/02/15  3:20 PM  Result Value Ref Range Status   Specimen Description URINE, CLEAN CATCH  Final   Special Requests NONE  Final   Culture   Final    1,000 COLONIES/mL INSIGNIFICANT GROWTH Performed at [facility]    Report Status 07/03/2015 FINAL  Final  Culture, blood (Routine X 2) w Reflex to ID Panel     Status: None (Preliminary result)   Collection Time: 07/03/15  7:30 PM  Result Value Ref Range Status   Specimen Description BLOOD LEFT ARM  Final   Special Requests BOTTLES DRAWN AEROBIC AND ANAEROBIC 6CC  Final   Culture NO GROWTH 3 DAYS  Final   Report Status PENDING  Incomplete  Culture, blood (Routine X 2) w Reflex to ID Panel     Status: None (Preliminary result)   Collection Time: 07/03/15  7:33 PM  Result Value Ref Range Status   Specimen Description BLOOD LEFT ARM  Final   Special Requests BOTTLES DRAWN AEROBIC AND ANAEROBIC 6CC  Final   Culture NO GROWTH 3 DAYS  Final   Report Status PENDING  Incomplete     Studies: No results found.  Scheduled Meds: . aspirin EC  81 mg Oral Daily  . atorvastatin  10 mg Oral q1800  . azaTHIOprine  50 mg Oral BID  . aztreonam  1 g Intravenous Q8H  . Darunavir Ethanolate  800 mg Oral Q breakfast  . elvitegravir-cobicistat-emtricitabine-tenofovir  1 tablet Oral Q breakfast  . escitalopram  10 mg Oral Daily  . ibuprofen  600 mg Oral NOW  . loratadine  10 mg Oral Daily  . metoprolol succinate  25 mg Oral QHS  . pantoprazole  40 mg Oral Daily  . pregabalin  200 mg Oral BID  . saccharomyces boulardii  250 mg Oral BID  . sodium chloride flush  3 mL Intravenous Q12H  . sodium chloride flush  3 mL Intravenous Q12H  . valACYclovir  500 mg Oral Daily  . vancomycin  1,250 mg Intravenous Q12H  . Warfarin - Pharmacist  Dosing Inpatient   Does not apply Q24H   Continuous Infusions:   Principal Problem:   Fever Active Problems:   HIV disease (HCC)   Rheumatoid arthritis ([place])   Chronic lymphoblastic leukemia ([place])   Postinflammatory pulmonary fibrosis ([place])    Time spent: 25 minutes. Greater than 50% of this time was spent in direct contact with the patient coordinating care.    [place]  [facility]  Pager [phone]  If 7PM-7AM, please contact night-coverage at [email or web address], password [facility] 07/07/2015, 4:33 PM  LOS: 5 days

## 2015-07-07 NOTE — Progress Notes (Signed)
[facility] for Vancomycin & Azactam Indication: pneumonia, FUO  Allergies  Allergen Reactions  . Morphine Other (See Comments)    REACTION: severe headache  . Other Anaphylaxis and Hives    Pecan  . Peanut-Containing Drug Products Anaphylaxis and Hives    Swelling of throat  . Oxycodone-Acetaminophen Other (See Comments)    REACTION: headache  . Penicillins Rash    Has patient had a PCN reaction causing immediate rash, facial/tongue/throat swelling, SOB or lightheadedness with hypotension: Yes Has patient had a PCN reaction causing severe rash involving mucus membranes or skin necrosis: No Has patient had a PCN reaction that required hospitalization No Has patient had a PCN reaction occurring within the last 10 years: No If all of the above answers are "NO", then may proceed with Cephalosporin use.   REACTION: red, flushed  . Promethazine Hcl Other (See Comments)    REACTION: makes him feel drunk   Patient Measurements: Height: 5\' 7"  (170.2 cm) Weight: 194 lb 7.1 oz (88.2 kg) IBW/kg (Calculated) : 66.1  Vital Signs: Temp: 99.9 F (37.7 C) (02/25 0532) Temp Source: Oral (02/25 0532) BP: 133/71 mmHg (02/25 0532) Pulse Rate: 93 (02/25 0532) Intake/Output from previous day: 02/24 0701 - 02/25 0700 In: 1926 [P.O.:720; I.V.:6; IV Piggyback:1200] Out: 975 [Urine:975] Intake/Output from this shift: Total I/O In: 3 [I.V.:3] Out: -   Labs:  Recent Labs  07/05/15 0612 07/06/15 0648  WBC 16.3* 17.3*  HGB 12.8* 12.6*  PLT 120* 130*  CREATININE  --  0.80   Estimated Creatinine Clearance: 94.9 mL/min (by C-G formula based on Cr of 0.8).  Recent Labs  07/06/15 2333  [facility] 12    Microbiology: Recent Results (from the past 720 hour(s))  Urine culture     Status: None   Collection Time: 06/18/15 11:51 AM  Result Value Ref Range Status   Specimen Description URINE, CLEAN CATCH  Final   Special Requests NONE  Final   Culture    Final    6,000 COLONIES/mL INSIGNIFICANT GROWTH Performed at [facility]    Report Status 06/19/2015 FINAL  Final  Blood culture (routine x 2)     Status: None   Collection Time: 06/18/15  2:09 PM  Result Value Ref Range Status   Specimen Description BLOOD LEFT ANTECUBITAL  Final   Special Requests   Final    BOTTLES DRAWN AEROBIC AND ANAEROBIC AEB=12CC ANA=10CC   Culture NO GROWTH 5 DAYS  Final   Report Status 06/23/2015 FINAL  Final  Blood culture (routine x 2)     Status: None   Collection Time: 06/18/15  2:16 PM  Result Value Ref Range Status   Specimen Description BLOOD RIGHT ANTECUBITAL  Final   Special Requests BOTTLES DRAWN AEROBIC AND ANAEROBIC 8CC EACH  Final   Culture NO GROWTH 5 DAYS  Final   Report Status 06/23/2015 FINAL  Final  Culture, blood (routine x 2)     Status: None (Preliminary result)   Collection Time: 07/02/15 12:09 PM  Result Value Ref Range Status   Specimen Description BLOOD LEFT HAND DRAWN BY RN EW  Final   Special Requests BOTTLES DRAWN AEROBIC AND ANAEROBIC 4CC EACH  Final   Culture NO GROWTH 4 DAYS  Final   Report Status PENDING  Incomplete  Culture, blood (routine x 2)     Status: None (Preliminary result)   Collection Time: 07/02/15 12:12 PM  Result Value Ref Range Status   Specimen  Description BLOOD RIGHT ANTECUBITAL  Final   Special Requests BOTTLES DRAWN AEROBIC AND ANAEROBIC [place]  Final   Culture NO GROWTH 4 DAYS  Final   Report Status PENDING  Incomplete  Urine culture     Status: None   Collection Time: 07/02/15  3:20 PM  Result Value Ref Range Status   Specimen Description URINE, CLEAN CATCH  Final   Special Requests NONE  Final   Culture   Final    1,000 COLONIES/mL INSIGNIFICANT GROWTH Performed at [facility]    Report Status 07/03/2015 FINAL  Final  Culture, blood (Routine X 2) w Reflex to ID Panel     Status: None (Preliminary result)   Collection Time: 07/03/15  7:30 PM  Result Value Ref Range Status    Specimen Description BLOOD LEFT ARM  Final   Special Requests BOTTLES DRAWN AEROBIC AND ANAEROBIC 6CC  Final   Culture NO GROWTH 3 DAYS  Final   Report Status PENDING  Incomplete  Culture, blood (Routine X 2) w Reflex to ID Panel     Status: None (Preliminary result)   Collection Time: 07/03/15  7:33 PM  Result Value Ref Range Status   Specimen Description BLOOD LEFT ARM  Final   Special Requests BOTTLES DRAWN AEROBIC AND ANAEROBIC 6CC  Final   Culture NO GROWTH 3 DAYS  Final   Report Status PENDING  Incomplete   Medical History: Past Medical History  Diagnosis Date  . Osteoarthritis, knee     s/p B TKA  . HIV infection ([place]) dx 1993  . TIA (transient ischemic attack) 1997    mild residual L mouth droop  . H/O hiatal hernia 2008    surgery  . Gynecomastia, male   . Impotence of organic origin   . Rheumatoid arthritis(714.0) dx 2010    MTX, follows with rheum  . Gout   . Chronic back pain     follows with Nsurg  . Seasonal allergies   . Diverticulosis   . Gallstones   . Status post dilation of esophageal narrowing   . GERD (gastroesophageal reflux disease)   . Diverticulosis   . Esophagitis   . Hemorrhoids   . Tubular adenoma of colon   . Myocardial infarction ([place]) 2010     x 2  . Hypertension   . DVT, lower extremity, recurrent ([place]) 2008, 2009    LLE, chronic anticoag since 2009  . Carotid artery occlusion     40-60% right ICA stenosis (09/2008)  . Neuromuscular disorder (HCC)     neuropathy  . Fibromyalgia   . Hepatitis A yrs ago  . CLL (chronic lymphoblastic leukemia) dx 2010    Followed at [facility] q50mo, no current therapy   . Secondary syphilis 07/24/14 dx    s/p 2 wks doxy  . Type II diabetes mellitus with manifestations ([place]) 04/02/2011  . Coronary artery disease 2010    s/p CABG '10, sees Dr. Percival Spanish   Medications:  Scheduled:  . aspirin EC  81 mg Oral Daily  . atorvastatin  10 mg Oral q1800  . azaTHIOprine  50 mg Oral BID  . aztreonam  1 g  Intravenous Q8H  . Darunavir Ethanolate  800 mg Oral Q breakfast  . elvitegravir-cobicistat-emtricitabine-tenofovir  1 tablet Oral Q breakfast  . escitalopram  10 mg Oral Daily  . loratadine  10 mg Oral Daily  . metoprolol succinate  25 mg Oral QHS  . pantoprazole  40 mg Oral Daily  .  pregabalin  200 mg Oral BID  . saccharomyces boulardii  250 mg Oral BID  . sodium chloride flush  3 mL Intravenous Q12H  . sodium chloride flush  3 mL Intravenous Q12H  . valACYclovir  500 mg Oral Daily  . vancomycin  1,000 mg Intravenous Q12H  . Warfarin - Pharmacist Dosing Inpatient   Does not apply Q24H   Assessment: 72 male recently DX with pneumonia and started on Levaquin and prednisone. Start Vancomycin & Azactam for HCAP, FUO.  Trough level is below goal.    FUO, recurrent/persistent pneumonia -Appears to be improving. -CT scan shows what appears to be residual pneumonia. -Case has been discussed via phone with Dr. Megan Salon, infectious diseases  -Recommendation to continue treatment for hospital-acquired pneumonia given his recent hospitalization with vancomycin and aztreonam for at least 5 days (per hospitalist report)  Goal of Therapy:  Vancomycin trough level 15-20 mcg/ml  Plan:  Increase Vancomycin 1250mg  IV every 12 hours to target trough 15-20 Given duration of therapy planned per ID, no need for additional levels Cont Azactam 1 GM IV every 8 hours F/u renal function, cultures and clinical course  Hart Robinsons A 07/07/2015,9:34 AM

## 2015-07-07 NOTE — Progress Notes (Signed)
ANTICOAGULATION CONSULT NOTE - follow up  Pharmacy Consult for coumadin Indication: DVT  Allergies  Allergen Reactions  . Morphine Other (See Comments)    REACTION: severe headache  . Other Anaphylaxis and Hives    Pecan  . Peanut-Containing Drug Products Anaphylaxis and Hives    Swelling of throat  . Oxycodone-Acetaminophen Other (See Comments)    REACTION: headache  . Penicillins Rash    Has patient had a PCN reaction causing immediate rash, facial/tongue/throat swelling, SOB or lightheadedness with hypotension: Yes Has patient had a PCN reaction causing severe rash involving mucus membranes or skin necrosis: No Has patient had a PCN reaction that required hospitalization No Has patient had a PCN reaction occurring within the last 10 years: No If all of the above answers are "NO", then may proceed with Cephalosporin use.   REACTION: red, flushed  . Promethazine Hcl Other (See Comments)    REACTION: makes him feel drunk   Patient Measurements: Height: 5\' 7"  (170.2 cm) Weight: 194 lb 7.1 oz (88.2 kg) IBW/kg (Calculated) : 66.1  Vital Signs: Temp: 99.9 F (37.7 C) (02/25 0532) Temp Source: Oral (02/25 0532) BP: 133/71 mmHg (02/25 0532) Pulse Rate: 93 (02/25 0532)  Labs:  Recent Labs  07/05/15 0612 07/06/15 0648 07/07/15 0543  HGB 12.8* 12.6*  --   HCT 37.6* 37.3*  --   PLT 120* 130*  --   LABPROT 33.4* 37.5* 31.9*  INR 3.37* 3.93* 3.17*  CREATININE  --  0.80  --    Estimated Creatinine Clearance: 94.9 mL/min (by C-G formula based on Cr of 0.8).  Medical History: Past Medical History  Diagnosis Date  . Osteoarthritis, knee     s/p B TKA  . HIV infection ([place]) dx 1993  . TIA (transient ischemic attack) 1997    mild residual L mouth droop  . H/O hiatal hernia 2008    surgery  . Gynecomastia, male   . Impotence of organic origin   . Rheumatoid arthritis(714.0) dx 2010    MTX, follows with rheum  . Gout   . Chronic back pain     follows with Nsurg  .  Seasonal allergies   . Diverticulosis   . Gallstones   . Status post dilation of esophageal narrowing   . GERD (gastroesophageal reflux disease)   . Diverticulosis   . Esophagitis   . Hemorrhoids   . Tubular adenoma of colon   . Myocardial infarction ([place]) 2010     x 2  . Hypertension   . DVT, lower extremity, recurrent ([place]) 2008, 2009    LLE, chronic anticoag since 2009  . Carotid artery occlusion     40-60% right ICA stenosis (09/2008)  . Neuromuscular disorder (H[facility])     neuropathy  . Fibromyalgia   . Hepatitis A yrs ago  . CLL (chronic lymphoblastic leukemia) dx 2010    Followed at [facility] q70mo, no current therapy   . Secondary syphilis 07/24/14 dx    s/p 2 wks doxy  . Type II diabetes mellitus with manifestations ([place]) 04/02/2011  . Coronary artery disease 2010    s/p CABG '10, sees Dr. Percival Spanish   Medications:  Prescriptions prior to admission  Medication Sig Dispense Refill Last Dose  . atorvastatin (LIPITOR) 10 MG tablet Take 1 tablet (10 mg total) by mouth at bedtime. (Patient taking differently: Take 10 mg by mouth every morning. ) 90 tablet 3 07/02/2015 at Unknown time  . escitalopram (LEXAPRO) 10 MG tablet Take 10 mg  by mouth daily.   07/02/2015 at Unknown time  . furosemide (LASIX) 20 MG tablet Take 1 tablet (20 mg total) by mouth 2 (two) times daily. (Patient taking differently: Take 20 mg by mouth every evening. ) 60 tablet 5 07/02/2015 at Unknown time  . GENVOYA 150-150-200-10 MG TABS tablet TAKE 1 TABLET BY MOUTH DAILY WITH BREAKFAST. 30 tablet 5 07/02/2015 at Unknown time  . levofloxacin (LEVAQUIN) 750 MG tablet Take 1 tablet (750 mg total) by mouth daily. 20 tablet 0 07/02/2015 at Unknown time  . loratadine (CLARITIN) 10 MG tablet Take 1 tablet (10 mg total) by mouth daily. 90 tablet 3 07/02/2015 at Unknown time  . LYRICA 200 MG capsule TAKE 1 CAPSULE BY MOUTH TWICE DAILY 60 capsule 5 07/02/2015 at Unknown time  . metoprolol succinate (TOPROL-XL) 25 MG 24 hr tablet Take 1  tablet (25 mg total) by mouth at bedtime. 30 tablet 11 07/02/2015 at 0830  . pantoprazole (PROTONIX) 40 MG tablet TAKE 1 TABLET EVERY DAY 60 tablet 0 07/02/2015 at Unknown time  . PREZISTA 800 MG tablet TAKE 1 TABLET (800 MG TOTAL) BY MOUTH DAILY. 30 tablet 5 07/02/2015 at Unknown time  . saccharomyces boulardii (FLORASTOR) 250 MG capsule Take 1 capsule (250 mg total) by mouth 2 (two) times daily. 60 capsule 1 07/02/2015 at Unknown time  . urea (CARMOL) 10 % cream APPLY TOPICALLY AS NEEDED. (Patient taking differently: APPLY TOPICALLY AS NEEDED -APPLIED TO BUTTOCKS) 85 g 0 07/01/2015 at Unknown time  . valACYclovir (VALTREX) 500 MG tablet TAKE 1 TABLET BY MOUTH EVERY DAY 90 tablet 3 07/02/2015 at Unknown time  . warfarin (COUMADIN) 10 MG tablet TAKE 1/2 ON WEDNESDAY AND 1 TABLET ALL OTHER DAYS (Patient taking differently: TAKE 10MG  ON WEDNESDAYS AND SATURDAYS, THEN TAKE 5MG  ON ALL OTHER DAYS) 60 tablet 3 07/01/2015 at Unknown time  . zolpidem (AMBIEN) 5 MG tablet TAKE 1 TABLET BY MOUTH AT BEDTIME AS NEEDED FOR SLEEP 30 tablet 2 07/01/2015 at Unknown time  . aspirin EC 81 MG tablet Take 81 mg by mouth daily.   Taking  . azaTHIOprine (IMURAN) 50 MG tablet Take 50 mg by mouth 2 (two) times daily.  3 Taking  . EPINEPHrine (EPIPEN 2-PAK) 0.3 mg/0.3 mL IJ SOAJ injection Inject 0.3 mLs (0.3 mg total) into the muscle once. 2 Device 0 Taking  . escitalopram (LEXAPRO) 10 MG tablet Take 1 tablet (10 mg total) by mouth daily. 90 tablet 1 06/18/2015 at Unknown time  . ibuprofen (ADVIL,MOTRIN) 200 MG tablet Take 200 mg by mouth every 6 (six) hours as needed for headache.   Taking  . inFLIXimab (REMICADE) 100 MG injection Inject into the vein every 6 (six) weeks.    Taking  . predniSONE (DELTASONE) 20 MG tablet Take 2 tablets (40 mg total) by mouth daily with breakfast. (Patient not taking: Reported on 07/02/2015) 14 tablet 0 Not Taking at Unknown time   Assessment: 65 yo man to continue coumadin for h/o DVT.  INR  SUPRAtherapeutic and warfarin on hold. Platelets are improving, H/H stable.  No bleeding reported.  Goal of Therapy:  INR 2-3 Monitor platelets by anticoagulation protocol: Yes   Plan:  Continue to HOLD warfarin / coumadin Daily PT/INR Monitor for bleeding complications  Thanks for allowing pharmacy to be a part of this patient's care.  Hart Robinsons, PharmD Clinical Pharmacist 07/07/2015,9:33 AM

## 2015-07-08 DIAGNOSIS — M05732 Rheumatoid arthritis with rheumatoid factor of left wrist without organ or systems involvement: Secondary | ICD-10-CM

## 2015-07-08 LAB — CULTURE, BLOOD (ROUTINE X 2)
Culture: NO GROWTH
Culture: NO GROWTH
Culture: NO GROWTH
Culture: NO GROWTH

## 2015-07-08 LAB — PROTIME-INR
INR: 2.37 — ABNORMAL HIGH (ref 0.00–1.49)
Prothrombin Time: 25.7 seconds — ABNORMAL HIGH (ref 11.6–15.2)

## 2015-07-08 LAB — INFLUENZA PANEL BY PCR (TYPE A & B)
H1N1 flu by pcr: NOT DETECTED
Influenza A By PCR: NEGATIVE
Influenza B By PCR: NEGATIVE

## 2015-07-08 MED ORDER — LEVOFLOXACIN 750 MG PO TABS
750.0000 mg | ORAL_TABLET | Freq: Every day | ORAL | Status: DC
  Administered 2015-07-08 – 2015-07-13 (×6): 750 mg via ORAL
  Filled 2015-07-08 (×6): qty 1

## 2015-07-08 NOTE — Progress Notes (Signed)
Patient with elevated temp, MD notified.  No new orders given.  PRN ibuprofen administered.

## 2015-07-08 NOTE — Progress Notes (Signed)
[facility] PROGRESS NOTE  KOTA CLENDENON [id number] DOB: [birth year] DOA: 07/02/2015 PCP: [name], MD  Assessment/Plan: Recurrent/persistent pneumonia -48 hours since last fever. -CT scan shows what appears to be residual pneumonia. -Case has been discussed via phone with Dr. Megan Salon, infectious diseases. -Recommendation to continue treatment for hospital-acquired pneumonia given his recent hospitalization with vancomycin and aztreonam for at least 5 days, recheck CD4 count and HIV viral load, okay to discontinue Bactrim given CD4 count of 1400 back in July 2016. -Has completed course of IV abx; will transition over to levaquin and monitor 1 more day in the hospital.  HIV -Continue antiretrovirals. -Check CD4 count and viral load.  Rheumatoid arthritis -Continue methotrexate   Code Status: Full code Family Communication: Patient only  Disposition Plan: Anticipate discharge home in 24 hours if remains febrile overnight   Consultants:  Telephone consultation with infectious diseases, Dr. Megan Salon   Antibiotics:  Levaquin   Subjective: Feels improved today, less cough, has a headache  Objective: Filed Vitals:   07/07/15 0532 07/07/15 1500 07/07/15 2137 07/08/15 0518  BP: 133/71 129/75 146/71 126/73  Pulse: 93 96 93 81  Temp: 99.9 F (37.7 C) 98.9 F (37.2 C) 98.6 F (37 C) 98.7 F (37.1 C)  TempSrc: Oral  Oral Oral  Resp: 18 18 18    Height:      Weight:      SpO2: 94% 96% 95% 97%    Intake/Output Summary (Last 24 hours) at 07/08/15 1133 Last data filed at 07/08/15 0825  Gross per 24 hour  Intake    480 ml  Output   2175 ml  Net  -1695 ml   Filed Weights   07/02/15 1152 07/02/15 1654  Weight: 86.183 kg (190 lb) 88.2 kg (194 lb 7.1 oz)    Exam:   General:  Alert, awake, oriented 3  Cardiovascular: Regular rate and rhythm, no murmurs, rubs or gallops  Respiratory: Mild bibasilar rhonchi  Abdomen: Soft, nontender,  nondistended, positive bowel sounds  Extremities: No clubbing, cyanosis or edema, positive pulses   Neurologic:  Grossly intact and nonfocal  Data Reviewed: Basic Metabolic Panel:  Recent Labs Lab 07/02/15 1209 07/02/15 1622 07/03/15 0553 07/04/15 0543 07/06/15 0648  NA 133* 137 134* 134* 130*  K 4.0 3.7 3.8 3.5 3.7  CL 100* 108 103 102 102  CO2 25 25 26 26  21*  GLUCOSE 110* 106* 93 95 96  BUN 28* 23* 16 13 12   CREATININE 1.21 1.07 0.88 0.78 0.80  CALCIUM 7.8* 6.7* 7.2* 7.3* 7.2*   Liver Function Tests:  Recent Labs Lab 07/02/15 1209  AST 41  ALT 35  ALKPHOS 60  BILITOT 1.0  PROT 5.4*  ALBUMIN 3.1*   No results for input(s): LIPASE, AMYLASE in the last 168 hours. No results for input(s): AMMONIA in the last 168 hours. CBC:  Recent Labs Lab 07/02/15 1209 07/03/15 0553 07/04/15 0543 07/05/15 0612 07/06/15 0648  WBC 21.0* 17.4* 15.7* 16.3* 17.3*  NEUTROABS 2.5  --   --   --   --   HGB 15.3 13.1 13.5 12.8* 12.6*  HCT 45.6 40.2 40.3 37.6* 37.3*  MCV 87.5 88.9 87.8 84.9 85.0  PLT 155 134* 115* 120* 130*   Cardiac Enzymes: No results for input(s): CKTOTAL, CKMB, CKMBINDEX, TROPONINI in the last 168 hours. BNP (last 3 results)  Recent Labs  04/20/15 1153  BNP 164.7*    ProBNP (last 3 results)  Recent Labs  04/19/15 1717 04/26/15 1653  PROBNP 189.0* 182.0*    CBG: No results for input(s): GLUCAP in the last 168 hours.  Recent Results (from the past 240 hour(s))  Culture, blood (routine x 2)     Status: None (Preliminary result)   Collection Time: 07/02/15 12:09 PM  Result Value Ref Range Status   Specimen Description BLOOD LEFT HAND DRAWN BY RN EW  Final   Special Requests BOTTLES DRAWN AEROBIC AND ANAEROBIC 4CC EACH  Final   Culture NO GROWTH 4 DAYS  Final   Report Status PENDING  Incomplete  Culture, blood (routine x 2)     Status: None (Preliminary result)   Collection Time: 07/02/15 12:12 PM  Result Value Ref Range Status   Specimen  Description BLOOD RIGHT ANTECUBITAL  Final   Special Requests BOTTLES DRAWN AEROBIC AND ANAEROBIC [place]  Final   Culture NO GROWTH 4 DAYS  Final   Report Status PENDING  Incomplete  Urine culture     Status: None   Collection Time: 07/02/15  3:20 PM  Result Value Ref Range Status   Specimen Description URINE, CLEAN CATCH  Final   Special Requests NONE  Final   Culture   Final    1,000 COLONIES/mL INSIGNIFICANT GROWTH Performed at [facility]    Report Status 07/03/2015 FINAL  Final  Culture, blood (Routine X 2) w Reflex to ID Panel     Status: None (Preliminary result)   Collection Time: 07/03/15  7:30 PM  Result Value Ref Range Status   Specimen Description BLOOD LEFT ARM  Final   Special Requests BOTTLES DRAWN AEROBIC AND ANAEROBIC 6CC  Final   Culture NO GROWTH 3 DAYS  Final   Report Status PENDING  Incomplete  Culture, blood (Routine X 2) w Reflex to ID Panel     Status: None (Preliminary result)   Collection Time: 07/03/15  7:33 PM  Result Value Ref Range Status   Specimen Description BLOOD LEFT ARM  Final   Special Requests BOTTLES DRAWN AEROBIC AND ANAEROBIC 6CC  Final   Culture NO GROWTH 3 DAYS  Final   Report Status PENDING  Incomplete     Studies: No results found.  Scheduled Meds: . aspirin EC  81 mg Oral Daily  . atorvastatin  10 mg Oral q1800  . azaTHIOprine  50 mg Oral BID  . Darunavir Ethanolate  800 mg Oral Q breakfast  . elvitegravir-cobicistat-emtricitabine-tenofovir  1 tablet Oral Q breakfast  . escitalopram  10 mg Oral Daily  . levofloxacin  750 mg Oral Daily  . loratadine  10 mg Oral Daily  . metoprolol succinate  25 mg Oral QHS  . pantoprazole  40 mg Oral Daily  . pregabalin  200 mg Oral BID  . saccharomyces boulardii  250 mg Oral BID  . sodium chloride flush  3 mL Intravenous Q12H  . sodium chloride flush  3 mL Intravenous Q12H  . valACYclovir  500 mg Oral Daily  . Warfarin - Pharmacist Dosing Inpatient   Does not apply Q24H    Continuous Infusions:   Principal Problem:   Fever Active Problems:   HIV disease (HCC)   Rheumatoid arthritis ([place])   Chronic lymphoblastic leukemia ([place])   Postinflammatory pulmonary fibrosis ([place])    Time spent: 25 minutes. Greater than 50% of this time was spent in direct contact with the patient coordinating care.    Lelon Frohlich  [facility] Pager [phone]  If 7PM-7AM, please contact night-coverage at [email or web address], password [facility] 07/08/2015, 11:33  AM  LOS: 6 days

## 2015-07-08 NOTE — Progress Notes (Signed)
Patient ambulated hallways with assist.  Very SOB and dyspneic on return to room.  States "it just took everything out of him."  Complained of some heaviness in his chest with exertion, but went away after for a couple of minutes.

## 2015-07-09 ENCOUNTER — Other Ambulatory Visit: Payer: Self-pay | Admitting: *Deleted

## 2015-07-09 LAB — BASIC METABOLIC PANEL
Anion gap: 8 (ref 5–15)
BUN: 12 mg/dL (ref 6–20)
CO2: 26 mmol/L (ref 22–32)
Calcium: 7.5 mg/dL — ABNORMAL LOW (ref 8.9–10.3)
Chloride: 101 mmol/L (ref 101–111)
Creatinine, Ser: 0.64 mg/dL (ref 0.61–1.24)
GFR calc Af Amer: >60 mL/min (ref >60)
GFR calc non Af Amer: >60 mL/min (ref >60)
Glucose, Bld: 92 mg/dL (ref 65–99)
Potassium: 3.7 mmol/L (ref 3.5–5.1)
Sodium: 135 mmol/L (ref 135–145)

## 2015-07-09 LAB — T-HELPER CELLS (CD4) COUNT (NOT AT ARMC)
CD4 % Helper T Cell: 9 % — ABNORMAL LOW (ref 33–55)
CD4 T Cell Abs: 870 /uL (ref 400–2700)

## 2015-07-09 LAB — PROTIME-INR
INR: 1.59 — ABNORMAL HIGH (ref 0.00–1.49)
Prothrombin Time: 19 seconds — ABNORMAL HIGH (ref 11.6–15.2)

## 2015-07-09 MED ORDER — SULFAMETHOXAZOLE-TRIMETHOPRIM 400-80 MG PO TABS
1.0000 | ORAL_TABLET | Freq: Two times a day (BID) | ORAL | Status: DC
  Filled 2015-07-09 (×5): qty 1

## 2015-07-09 MED ORDER — WARFARIN SODIUM 5 MG PO TABS
10.0000 mg | ORAL_TABLET | Freq: Once | ORAL | Status: AC
  Administered 2015-07-09: 10 mg via ORAL
  Filled 2015-07-09: qty 2

## 2015-07-09 MED ORDER — SULFAMETHOXAZOLE-TRIMETHOPRIM 800-160 MG PO TABS
0.5000 | ORAL_TABLET | Freq: Two times a day (BID) | ORAL | Status: DC
  Administered 2015-07-09 – 2015-07-10 (×4): 0.5 via ORAL
  Filled 2015-07-09 (×6): qty 1

## 2015-07-09 MED ORDER — METHYLPREDNISOLONE SODIUM SUCC 125 MG IJ SOLR
60.0000 mg | Freq: Four times a day (QID) | INTRAMUSCULAR | Status: DC
  Administered 2015-07-09 – 2015-07-13 (×16): 60 mg via INTRAVENOUS
  Filled 2015-07-09 (×17): qty 2

## 2015-07-09 NOTE — Telephone Encounter (Signed)
Faxed denial on Prezista as patient is currently admitted and his med list may change and is blocked for outside refills. Pharmacy notified Myrtis Hopping

## 2015-07-09 NOTE — Progress Notes (Signed)
[facility] PROGRESS NOTE  [name] DOB: January 07, 1951 DOA: 07/02/2015 PCP: [name], MD  Assessment/Plan: Recurrent/persistent pneumonia -With persistent fevers. -CT scan shows what appears to be residual pneumonia. -Case had been discussed via phone with Dr. Megan Salon, infectious diseases last week. Recommendation wasto continue treatment for hospital-acquired pneumonia given his recent hospitalization with vancomycin and aztreonam for at least 5 days, recheck CD4 count and HIV viral load, okay to discontinue Bactrim given CD4 count of 1400 back in July 2016. -He completed broad spectrum abx and was transitioned to levaquin. Continued to spike fevers. -Discussed with Dr. Linus Salmons 2/27: recommends adding bactrim to cover possible PCP (CD4 count pending) and addition of steroids given hypoxemia in case he does have PCP or BOOP. PCP smear requested. -Patient prefers to transfer to [facility] given lack of improvement in the past week and availability of ID consultants.  HIV -Continue antiretrovirals. -Check CD4 count and viral load.  Rheumatoid arthritis -Continue methotrexate   Code Status: Full code Family Communication: Cleaster Corin at bedside updated on plan of care and all questions answered. Disposition Plan: Transfer to [facility] today   Consultants:  Telephone consultation with infectious diseases, Dr. Megan Salon and Dr. Linus Salmons   Antibiotics:  Levaquin   Bactrim  Subjective: Still feels SOB, HA persists  Objective: Filed Vitals:   07/09/15 0451 07/09/15 0622 07/09/15 0937 07/09/15 1247  BP: 114/59   120/64  Pulse: 101   87  Temp: 99.5 F (37.5 C) 100.5 F (38.1 C)  98.2 F (36.8 C)  TempSrc: Oral   Oral  Resp: 20   18  Height:      Weight:      SpO2: 93%  94% 90%    Intake/Output Summary (Last 24 hours) at 07/09/15 1523 Last data filed at 07/09/15 0948  Gross per 24 hour  Intake    120 ml  Output   1200 ml  Net  -1080 ml   Filed Weights    07/02/15 1152 07/02/15 1654  Weight: 86.183 kg (190 lb) 88.2 kg (194 lb 7.1 oz)    Exam:   General:  Alert, awake, oriented 3  Cardiovascular: Regular rate and rhythm, no murmurs, rubs or gallops  Respiratory: Mild bibasilar rhonchi  Abdomen: Soft, nontender, nondistended, positive bowel sounds  Extremities: No clubbing, cyanosis or edema, positive pulses   Neurologic:  Grossly intact and nonfocal  Data Reviewed: Basic Metabolic Panel:  Recent Labs Lab 07/02/15 1622 07/03/15 0553 07/04/15 0543 07/06/15 0648 07/09/15 0539  NA 137 134* 134* 130* 135  K 3.7 3.8 3.5 3.7 3.7  CL 108 103 102 102 101  CO2 25 26 26  21* 26  GLUCOSE 106* 93 95 96 92  BUN 23* 16 13 12 12   CREATININE 1.07 0.88 0.78 0.80 0.64  CALCIUM 6.7* 7.2* 7.3* 7.2* 7.5*   Liver Function Tests: No results for input(s): AST, ALT, ALKPHOS, BILITOT, PROT, ALBUMIN in the last 168 hours. No results for input(s): LIPASE, AMYLASE in the last 168 hours. No results for input(s): AMMONIA in the last 168 hours. CBC:  Recent Labs Lab 07/03/15 0553 07/04/15 0543 07/05/15 0612 07/06/15 0648  WBC 17.4* 15.7* 16.3* 17.3*  HGB 13.1 13.5 12.8* 12.6*  HCT 40.2 40.3 37.6* 37.3*  MCV 88.9 87.8 84.9 85.0  PLT 134* 115* 120* 130*   Cardiac Enzymes: No results for input(s): CKTOTAL, CKMB, CKMBINDEX, TROPONINI in the last 168 hours. BNP (last 3 results)  Recent Labs  04/20/15 1153  BNP  164.7*    ProBNP (last 3 results)  Recent Labs  04/19/15 1717 04/26/15 1653  PROBNP 189.0* 182.0*    CBG: No results for input(s): GLUCAP in the last 168 hours.  Recent Results (from the past 240 hour(s))  Culture, blood (routine x 2)     Status: None   Collection Time: 07/02/15 12:09 PM  Result Value Ref Range Status   Specimen Description BLOOD LEFT HAND DRAWN BY RN EW  Final   Special Requests BOTTLES DRAWN AEROBIC AND ANAEROBIC 4CC EACH  Final   Culture NO GROWTH 6 DAYS  Final   Report Status 07/08/2015 FINAL   Final  Culture, blood (routine x 2)     Status: None   Collection Time: 07/02/15 12:12 PM  Result Value Ref Range Status   Specimen Description BLOOD RIGHT ANTECUBITAL  Final   Special Requests BOTTLES DRAWN AEROBIC AND ANAEROBIC [place]  Final   Culture NO GROWTH 6 DAYS  Final   Report Status 07/08/2015 FINAL  Final  Urine culture     Status: None   Collection Time: 07/02/15  3:20 PM  Result Value Ref Range Status   Specimen Description URINE, CLEAN CATCH  Final   Special Requests NONE  Final   Culture   Final    1,000 COLONIES/mL INSIGNIFICANT GROWTH Performed at [facility]    Report Status 07/03/2015 FINAL  Final  Culture, blood (Routine X 2) w Reflex to ID Panel     Status: None   Collection Time: 07/03/15  7:30 PM  Result Value Ref Range Status   Specimen Description BLOOD LEFT ARM  Final   Special Requests BOTTLES DRAWN AEROBIC AND ANAEROBIC 6CC  Final   Culture NO GROWTH 5 DAYS  Final   Report Status 07/08/2015 FINAL  Final  Culture, blood (Routine X 2) w Reflex to ID Panel     Status: None   Collection Time: 07/03/15  7:33 PM  Result Value Ref Range Status   Specimen Description BLOOD LEFT ARM  Final   Special Requests BOTTLES DRAWN AEROBIC AND ANAEROBIC [place]  Final   Culture NO GROWTH 5 DAYS  Final   Report Status 07/08/2015 FINAL  Final     Studies: No results found.  Scheduled Meds: . aspirin EC  81 mg Oral Daily  . atorvastatin  10 mg Oral q1800  . azaTHIOprine  50 mg Oral BID  . Darunavir Ethanolate  800 mg Oral Q breakfast  . elvitegravir-cobicistat-emtricitabine-tenofovir  1 tablet Oral Q breakfast  . escitalopram  10 mg Oral Daily  . levofloxacin  750 mg Oral Daily  . loratadine  10 mg Oral Daily  . methylPREDNISolone (SOLU-MEDROL) injection  60 mg Intravenous Q6H  . metoprolol succinate  25 mg Oral QHS  . pantoprazole  40 mg Oral Daily  . pregabalin  200 mg Oral BID  . saccharomyces boulardii  250 mg Oral BID  . sodium chloride flush  3 mL  Intravenous Q12H  . sodium chloride flush  3 mL Intravenous Q12H  . sulfamethoxazole-trimethoprim  0.5 tablet Oral Q12H  . valACYclovir  500 mg Oral Daily  . warfarin  10 mg Oral Once  . Warfarin - Pharmacist Dosing Inpatient   Does not apply Q24H   Continuous Infusions:   Principal Problem:   Fever Active Problems:   HIV disease (HCC)   Rheumatoid arthritis ([place])   Chronic lymphoblastic leukemia ([place])   Postinflammatory pulmonary fibrosis ([place])    Time spent: 25  minutes. Greater than 50% of this time was spent in direct contact with the patient coordinating care.    Lelon Frohlich  [facility] Pager [phone]  If 7PM-7AM, please contact night-coverage at [email or web address], password [facility] 07/09/2015, 3:23 PM  LOS: 7 days

## 2015-07-09 NOTE — Progress Notes (Addendum)
ANTICOAGULATION CONSULT NOTE - follow up  Pharmacy Consult for coumadin Indication: DVT  Allergies  Allergen Reactions  . Morphine Other (See Comments)    REACTION: severe headache  . Other Anaphylaxis and Hives    Pecan  . Peanut-Containing Drug Products Anaphylaxis and Hives    Swelling of throat  . Oxycodone-Acetaminophen Other (See Comments)    REACTION: headache  . Penicillins Rash    Has patient had a PCN reaction causing immediate rash, facial/tongue/throat swelling, SOB or lightheadedness with hypotension: Yes Has patient had a PCN reaction causing severe rash involving mucus membranes or skin necrosis: No Has patient had a PCN reaction that required hospitalization No Has patient had a PCN reaction occurring within the last 10 years: No If all of the above answers are "NO", then may proceed with Cephalosporin use.   REACTION: red, flushed  . Promethazine Hcl Other (See Comments)    REACTION: makes him feel drunk   Patient Measurements: Height: 5\' 7"  (170.2 cm) Weight: 194 lb 7.1 oz (88.2 kg) IBW/kg (Calculated) : 66.1  Vital Signs: Temp: 100.5 F (38.1 C) (02/27 0622) Temp Source: Oral (02/27 0451) BP: 114/59 mmHg (02/27 0451) Pulse Rate: 101 (02/27 0451)  Labs:  Recent Labs  07/07/15 0543 07/08/15 0543 07/09/15 0539  LABPROT 31.9* 25.7* 19.0*  INR 3.17* 2.37* 1.59*  CREATININE  --   --  0.64   Estimated Creatinine Clearance: 94.9 mL/min (by C-G formula based on Cr of 0.64).  Medical History: Past Medical History  Diagnosis Date  . Osteoarthritis, knee     s/p B TKA  . HIV infection ([place]) dx 1993  . TIA (transient ischemic attack) 1997    mild residual L mouth droop  . H/O hiatal hernia 2008    surgery  . Gynecomastia, male   . Impotence of organic origin   . Rheumatoid arthritis(714.0) dx 2010    MTX, follows with rheum  . Gout   . Chronic back pain     follows with Nsurg  . Seasonal allergies   . Diverticulosis   . Gallstones   .  Status post dilation of esophageal narrowing   . GERD (gastroesophageal reflux disease)   . Diverticulosis   . Esophagitis   . Hemorrhoids   . Tubular adenoma of colon   . Myocardial infarction ([place]) 2010     x 2  . Hypertension   . DVT, lower extremity, recurrent ([place]) 2008, 2009    LLE, chronic anticoag since 2009  . Carotid artery occlusion     40-60% right ICA stenosis (09/2008)  . Neuromuscular disorder (HCC)     neuropathy  . Fibromyalgia   . Hepatitis A yrs ago  . CLL (chronic lymphoblastic leukemia) dx 2010    Followed at [facility] q29mo, no current therapy   . Secondary syphilis 07/24/14 dx    s/p 2 wks doxy  . Type II diabetes mellitus with manifestations ([place]) 04/02/2011  . Coronary artery disease 2010    s/p CABG '10, sees Dr. Percival Spanish   Medications:  Prescriptions prior to admission  Medication Sig Dispense Refill Last Dose  . atorvastatin (LIPITOR) 10 MG tablet Take 1 tablet (10 mg total) by mouth at bedtime. (Patient taking differently: Take 10 mg by mouth every morning. ) 90 tablet 3 07/02/2015 at Unknown time  . escitalopram (LEXAPRO) 10 MG tablet Take 10 mg by mouth daily.   07/02/2015 at Unknown time  . furosemide (LASIX) 20 MG tablet Take 1 tablet (20 mg  total) by mouth 2 (two) times daily. (Patient taking differently: Take 20 mg by mouth every evening. ) 60 tablet 5 07/02/2015 at Unknown time  . GENVOYA 150-150-200-10 MG TABS tablet TAKE 1 TABLET BY MOUTH DAILY WITH BREAKFAST. 30 tablet 5 07/02/2015 at Unknown time  . levofloxacin (LEVAQUIN) 750 MG tablet Take 1 tablet (750 mg total) by mouth daily. 20 tablet 0 07/02/2015 at Unknown time  . loratadine (CLARITIN) 10 MG tablet Take 1 tablet (10 mg total) by mouth daily. 90 tablet 3 07/02/2015 at Unknown time  . LYRICA 200 MG capsule TAKE 1 CAPSULE BY MOUTH TWICE DAILY 60 capsule 5 07/02/2015 at Unknown time  . metoprolol succinate (TOPROL-XL) 25 MG 24 hr tablet Take 1 tablet (25 mg total) by mouth at bedtime. 30 tablet 11  07/02/2015 at 0830  . pantoprazole (PROTONIX) 40 MG tablet TAKE 1 TABLET EVERY DAY 60 tablet 0 07/02/2015 at Unknown time  . PREZISTA 800 MG tablet TAKE 1 TABLET (800 MG TOTAL) BY MOUTH DAILY. 30 tablet 5 07/02/2015 at Unknown time  . saccharomyces boulardii (FLORASTOR) 250 MG capsule Take 1 capsule (250 mg total) by mouth 2 (two) times daily. 60 capsule 1 07/02/2015 at Unknown time  . urea (CARMOL) 10 % cream APPLY TOPICALLY AS NEEDED. (Patient taking differently: APPLY TOPICALLY AS NEEDED -APPLIED TO BUTTOCKS) 85 g 0 07/01/2015 at Unknown time  . valACYclovir (VALTREX) 500 MG tablet TAKE 1 TABLET BY MOUTH EVERY DAY 90 tablet 3 07/02/2015 at Unknown time  . warfarin (COUMADIN) 10 MG tablet TAKE 1/2 ON WEDNESDAY AND 1 TABLET ALL OTHER DAYS (Patient taking differently: TAKE 10MG  ON WEDNESDAYS AND SATURDAYS, THEN TAKE 5MG  ON ALL OTHER DAYS) 60 tablet 3 07/01/2015 at Unknown time  . zolpidem (AMBIEN) 5 MG tablet TAKE 1 TABLET BY MOUTH AT BEDTIME AS NEEDED FOR SLEEP 30 tablet 2 07/01/2015 at Unknown time  . aspirin EC 81 MG tablet Take 81 mg by mouth daily.   Taking  . azaTHIOprine (IMURAN) 50 MG tablet Take 50 mg by mouth 2 (two) times daily.  3 Taking  . EPINEPHrine (EPIPEN 2-PAK) 0.3 mg/0.3 mL IJ SOAJ injection Inject 0.3 mLs (0.3 mg total) into the muscle once. 2 Device 0 Taking  . escitalopram (LEXAPRO) 10 MG tablet Take 1 tablet (10 mg total) by mouth daily. 90 tablet 1 06/18/2015 at Unknown time  . ibuprofen (ADVIL,MOTRIN) 200 MG tablet Take 200 mg by mouth every 6 (six) hours as needed for headache.   Taking  . inFLIXimab (REMICADE) 100 MG injection Inject into the vein every 6 (six) weeks.    Taking  . predniSONE (DELTASONE) 20 MG tablet Take 2 tablets (40 mg total) by mouth daily with breakfast. (Patient not taking: Reported on 07/02/2015) 14 tablet 0 Not Taking at Unknown time   Assessment: 65 yo man to continue coumadin for h/o DVT.  INR below goal, Coumadin was held initially due to SUPRAtherapeutic  INR.  Platelets are improving, H/H stable.  No bleeding reported. Pt is on ibuprofen, aspirin, Imuran and Levaquin which may interact with warfarin and make dosing challenging.    Goal of Therapy:  INR 2-3 Monitor platelets by anticoagulation protocol: Yes   Plan:  Coumadin 10mg  today (home dose per PTA med list) Daily PT/INR Monitor for bleeding complications  Thanks for allowing pharmacy to be a part of this patient's care.  Hart Robinsons, PharmD Clinical Pharmacist 07/09/2015,10:28 AM

## 2015-07-10 LAB — CBC
HCT: 39.8 % (ref 39.0–52.0)
Hemoglobin: 13.4 g/dL (ref 13.0–17.0)
MCH: 29.2 pg (ref 26.0–34.0)
MCHC: 33.7 g/dL (ref 30.0–36.0)
MCV: 86.7 fL (ref 78.0–100.0)
Platelets: 166 10*3/uL (ref 150–400)
RBC: 4.59 MIL/uL (ref 4.22–5.81)
RDW: 18.2 % — ABNORMAL HIGH (ref 11.5–15.5)
WBC: 14.2 10*3/uL — ABNORMAL HIGH (ref 4.0–10.5)

## 2015-07-10 LAB — PROTIME-INR
INR: 1.85 — ABNORMAL HIGH (ref 0.00–1.49)
Prothrombin Time: 21.3 seconds — ABNORMAL HIGH (ref 11.6–15.2)

## 2015-07-10 MED ORDER — WARFARIN SODIUM 5 MG PO TABS
5.0000 mg | ORAL_TABLET | Freq: Once | ORAL | Status: AC
  Administered 2015-07-10: 5 mg via ORAL
  Filled 2015-07-10: qty 1

## 2015-07-10 NOTE — Progress Notes (Signed)
[facility] PROGRESS NOTE  [name] [id number] DOB: 10-20-1950 DOA: 07/02/2015 PCP: [name], MD  Assessment/Plan: Recurrent/persistent pneumonia -With persistent fevers. -CT scan shows what appears to be residual pneumonia. -Case had been discussed via phone with Dr. Megan Salon, infectious diseases last week. Recommendation was to continue treatment for hospital-acquired pneumonia given his recent hospitalization with vancomycin and aztreonam for at least 5 days, recheck CD4 count and HIV viral load, okay to discontinue Bactrim given CD4 count of 1400 back in July 2016. -He completed broad spectrum abx and was transitioned to levaquin. Continued to spike fevers. -Discussed with Dr. Linus Salmons 2/27: recommended adding bactrim to cover possible PCP (CD4 count pending) and addition of steroids given hypoxemia in case he does have PCP or BOOP. PCP smear requested. -Patient prefers to transfer to [facility] given lack of improvement in the past week and availability of ID consultants. Transfer was held yesterday due to lack of bed availability at [facility].  HIV -Continue antiretrovirals. -Check CD4 count and viral load.  Rheumatoid arthritis -Continue methotrexate   Code Status: Full code Family Communication: daughter and SIL at bedside updated on plan of care and all questions answered. Disposition Plan: Transfer to [facility] today   Consultants:  Telephone consultation with infectious diseases, Dr. Megan Salon and Dr. Linus Salmons   Antibiotics:  Levaquin   Bactrim  Subjective: Feels much improved today, no longer feels SOB. He states that he is positive "that bactrim is the piece of the puzzle that was missing".  Objective: Filed Vitals:   07/10/15 0250 07/10/15 0650 07/10/15 1019 07/10/15 1215  BP: 129/78 125/71  120/69  Pulse: 82 83  102  Temp: 98.3 F (36.8 C) 98.2 F (36.8 C)  98 F (36.7 C)  TempSrc: Oral Oral  Oral  Resp: 20 20  20   Height:      Weight:      SpO2: 95%  92% 93% 95%    Intake/Output Summary (Last 24 hours) at 07/10/15 1444 Last data filed at 07/10/15 1200  Gross per 24 hour  Intake    840 ml  Output   1600 ml  Net   -760 ml   Filed Weights   07/02/15 1152 07/02/15 1654  Weight: 86.183 kg (190 lb) 88.2 kg (194 lb 7.1 oz)    Exam:   General:  Alert, awake, oriented 3  Cardiovascular: Regular rate and rhythm, no murmurs, rubs or gallops  Respiratory: Mild bibasilar rhonchi  Abdomen: Soft, nontender, nondistended, positive bowel sounds  Extremities: No clubbing, cyanosis or edema, positive pulses   Neurologic:  Grossly intact and nonfocal  Data Reviewed: Basic Metabolic Panel:  Recent Labs Lab 07/04/15 0543 07/06/15 0648 07/09/15 0539  NA 134* 130* 135  K 3.5 3.7 3.7  CL 102 102 101  CO2 26 21* 26  GLUCOSE 95 96 92  BUN 13 12 12   CREATININE 0.78 0.80 0.64  CALCIUM 7.3* 7.2* 7.5*   Liver Function Tests: No results for input(s): AST, ALT, ALKPHOS, BILITOT, PROT, ALBUMIN in the last 168 hours. No results for input(s): LIPASE, AMYLASE in the last 168 hours. No results for input(s): AMMONIA in the last 168 hours. CBC:  Recent Labs Lab 07/04/15 0543 07/05/15 0612 07/06/15 0648 07/10/15 0612  WBC 15.7* 16.3* 17.3* 14.2*  HGB 13.5 12.8* 12.6* 13.4  HCT 40.3 37.6* 37.3* 39.8  MCV 87.8 84.9 85.0 86.7  PLT 115* 120* 130* 166   Cardiac Enzymes: No results for input(s): CKTOTAL, CKMB, CKMBINDEX, TROPONINI in the  last 168 hours. BNP (last 3 results)  Recent Labs  04/20/15 1153  BNP 164.7*    ProBNP (last 3 results)  Recent Labs  04/19/15 1717 04/26/15 1653  PROBNP 189.0* 182.0*    CBG: No results for input(s): GLUCAP in the last 168 hours.  Recent Results (from the past 240 hour(s))  Culture, blood (routine x 2)     Status: None   Collection Time: 07/02/15 12:09 PM  Result Value Ref Range Status   Specimen Description BLOOD LEFT HAND DRAWN BY RN EW  Final   Special Requests BOTTLES DRAWN  AEROBIC AND ANAEROBIC 4CC EACH  Final   Culture NO GROWTH 6 DAYS  Final   Report Status 07/08/2015 FINAL  Final  Culture, blood (routine x 2)     Status: None   Collection Time: 07/02/15 12:12 PM  Result Value Ref Range Status   Specimen Description BLOOD RIGHT ANTECUBITAL  Final   Special Requests BOTTLES DRAWN AEROBIC AND ANAEROBIC [place]  Final   Culture NO GROWTH 6 DAYS  Final   Report Status 07/08/2015 FINAL  Final  Urine culture     Status: None   Collection Time: 07/02/15  3:20 PM  Result Value Ref Range Status   Specimen Description URINE, CLEAN CATCH  Final   Special Requests NONE  Final   Culture   Final    1,000 COLONIES/mL INSIGNIFICANT GROWTH Performed at [facility]    Report Status 07/03/2015 FINAL  Final  Culture, blood (Routine X 2) w Reflex to ID Panel     Status: None   Collection Time: 07/03/15  7:30 PM  Result Value Ref Range Status   Specimen Description BLOOD LEFT ARM  Final   Special Requests BOTTLES DRAWN AEROBIC AND ANAEROBIC 6CC  Final   Culture NO GROWTH 5 DAYS  Final   Report Status 07/08/2015 FINAL  Final  Culture, blood (Routine X 2) w Reflex to ID Panel     Status: None   Collection Time: 07/03/15  7:33 PM  Result Value Ref Range Status   Specimen Description BLOOD LEFT ARM  Final   Special Requests BOTTLES DRAWN AEROBIC AND ANAEROBIC [place]  Final   Culture NO GROWTH 5 DAYS  Final   Report Status 07/08/2015 FINAL  Final     Studies: No results found.  Scheduled Meds: . aspirin EC  81 mg Oral Daily  . atorvastatin  10 mg Oral q1800  . azaTHIOprine  50 mg Oral BID  . Darunavir Ethanolate  800 mg Oral Q breakfast  . elvitegravir-cobicistat-emtricitabine-tenofovir  1 tablet Oral Q breakfast  . escitalopram  10 mg Oral Daily  . levofloxacin  750 mg Oral Daily  . loratadine  10 mg Oral Daily  . methylPREDNISolone (SOLU-MEDROL) injection  60 mg Intravenous Q6H  . metoprolol succinate  25 mg Oral QHS  . pantoprazole  40 mg Oral Daily    . pregabalin  200 mg Oral BID  . saccharomyces boulardii  250 mg Oral BID  . sodium chloride flush  3 mL Intravenous Q12H  . sodium chloride flush  3 mL Intravenous Q12H  . sulfamethoxazole-trimethoprim  0.5 tablet Oral Q12H  . valACYclovir  500 mg Oral Daily  . warfarin  5 mg Oral Once  . Warfarin - Pharmacist Dosing Inpatient   Does not apply Q24H   Continuous Infusions:   Principal Problem:   Fever Active Problems:   HIV disease ([place])   Rheumatoid arthritis ([place])  Chronic lymphoblastic leukemia ([place])   Postinflammatory pulmonary fibrosis ([place])    Time spent: 25 minutes. Greater than 50% of this time was spent in direct contact with the patient coordinating care.    Lelon Frohlich  [facility] Pager [phone]  If 7PM-7AM, please contact night-coverage at [email or web address], password [facility] 07/10/2015, 2:44 PM  LOS: 8 days

## 2015-07-10 NOTE — Progress Notes (Signed)
Pt. Transferred via carelink to [facility].  Report given to [facility] on 5W.

## 2015-07-10 NOTE — Consult Note (Signed)
   [facility] CM Inpatient Consult   07/10/2015  [name] 05-Sep-1950 [id number]   Spoke with patient at bedside regarding [facility] services. Patient does not want to participate with [facility] at this time as he states he has a case manager already through a community program and feels confident this is working out best for him at this time. However, patient given [facility] brochure and contact information for future reference, voices appreciation of information.  Inpatient case manager aware that patient offered [facility] case management services but declined.  Of note, [facility] Care Management services would not replace or interfere with any services that are arranged by inpatient case management or social work. For additional questions or referrals please contact:  Royetta Crochet. Laymond Purser, RN, BSN, [facility] Liaison [phone]

## 2015-07-10 NOTE — Progress Notes (Signed)
Report received from [place], [place]. Pt. Arrived to 5W and has been oriented to unit. Pt. Educated about safety, and unit procedures. Pt. Verbalizes understanding and is awaiting dinner tray.

## 2015-07-10 NOTE — Progress Notes (Signed)
ANTICOAGULATION CONSULT NOTE - follow up  Pharmacy Consult for coumadin Indication: DVT  Allergies  Allergen Reactions  . Morphine Other (See Comments)    REACTION: severe headache  . Other Anaphylaxis and Hives    Pecan  . Peanut-Containing Drug Products Anaphylaxis and Hives    Swelling of throat  . Oxycodone-Acetaminophen Other (See Comments)    REACTION: headache  . Penicillins Rash    Has patient had a PCN reaction causing immediate rash, facial/tongue/throat swelling, SOB or lightheadedness with hypotension: Yes Has patient had a PCN reaction causing severe rash involving mucus membranes or skin necrosis: No Has patient had a PCN reaction that required hospitalization No Has patient had a PCN reaction occurring within the last 10 years: No If all of the above answers are "NO", then may proceed with Cephalosporin use.   REACTION: red, flushed  . Promethazine Hcl Other (See Comments)    REACTION: makes him feel drunk   Patient Measurements: Height: 5\' 7"  (170.2 cm) Weight: 194 lb 7.1 oz (88.2 kg) IBW/kg (Calculated) : 66.1  Vital Signs: Temp: 98.2 F (36.8 C) (02/28 0650) Temp Source: Oral (02/28 0650) BP: 125/71 mmHg (02/28 0650) Pulse Rate: 83 (02/28 0650)  Labs:  Recent Labs  07/08/15 0543 07/09/15 0539 07/10/15 0612  HGB  --   --  13.4  HCT  --   --  39.8  PLT  --   --  166  LABPROT 25.7* 19.0* 21.3*  INR 2.37* 1.59* 1.85*  CREATININE  --  0.64  --    Estimated Creatinine Clearance: 94.9 mL/min (by C-G formula based on Cr of 0.64).  Medical History: Past Medical History  Diagnosis Date  . Osteoarthritis, knee     s/p B TKA  . HIV infection ([place]) dx 1993  . TIA (transient ischemic attack) 1997    mild residual L mouth droop  . H/O hiatal hernia 2008    surgery  . Gynecomastia, male   . Impotence of organic origin   . Rheumatoid arthritis(714.0) dx 2010    MTX, follows with rheum  . Gout   . Chronic back pain     follows with Nsurg  .  Seasonal allergies   . Diverticulosis   . Gallstones   . Status post dilation of esophageal narrowing   . GERD (gastroesophageal reflux disease)   . Diverticulosis   . Esophagitis   . Hemorrhoids   . Tubular adenoma of colon   . Myocardial infarction ([place]) 2010     x 2  . Hypertension   . DVT, lower extremity, recurrent ([place]) 2008, 2009    LLE, chronic anticoag since 2009  . Carotid artery occlusion     40-60% right ICA stenosis (09/2008)  . Neuromuscular disorder (HCC)     neuropathy  . Fibromyalgia   . Hepatitis A yrs ago  . CLL (chronic lymphoblastic leukemia) dx 2010    Followed at [facility] q63mo, no current therapy   . Secondary syphilis 07/24/14 dx    s/p 2 wks doxy  . Type II diabetes mellitus with manifestations ([place]) 04/02/2011  . Coronary artery disease 2010    s/p CABG '10, sees Dr. Percival Spanish   Medications:  Prescriptions prior to admission  Medication Sig Dispense Refill Last Dose  . atorvastatin (LIPITOR) 10 MG tablet Take 1 tablet (10 mg total) by mouth at bedtime. (Patient taking differently: Take 10 mg by mouth every morning. ) 90 tablet 3 07/02/2015 at Unknown time  . escitalopram (LEXAPRO)  10 MG tablet Take 10 mg by mouth daily.   07/02/2015 at Unknown time  . furosemide (LASIX) 20 MG tablet Take 1 tablet (20 mg total) by mouth 2 (two) times daily. (Patient taking differently: Take 20 mg by mouth every evening. ) 60 tablet 5 07/02/2015 at Unknown time  . GENVOYA 150-150-200-10 MG TABS tablet TAKE 1 TABLET BY MOUTH DAILY WITH BREAKFAST. 30 tablet 5 07/02/2015 at Unknown time  . levofloxacin (LEVAQUIN) 750 MG tablet Take 1 tablet (750 mg total) by mouth daily. 20 tablet 0 07/02/2015 at Unknown time  . loratadine (CLARITIN) 10 MG tablet Take 1 tablet (10 mg total) by mouth daily. 90 tablet 3 07/02/2015 at Unknown time  . LYRICA 200 MG capsule TAKE 1 CAPSULE BY MOUTH TWICE DAILY 60 capsule 5 07/02/2015 at Unknown time  . metoprolol succinate (TOPROL-XL) 25 MG 24 hr tablet Take 1  tablet (25 mg total) by mouth at bedtime. 30 tablet 11 07/02/2015 at 0830  . pantoprazole (PROTONIX) 40 MG tablet TAKE 1 TABLET EVERY DAY 60 tablet 0 07/02/2015 at Unknown time  . PREZISTA 800 MG tablet TAKE 1 TABLET (800 MG TOTAL) BY MOUTH DAILY. 30 tablet 5 07/02/2015 at Unknown time  . saccharomyces boulardii (FLORASTOR) 250 MG capsule Take 1 capsule (250 mg total) by mouth 2 (two) times daily. 60 capsule 1 07/02/2015 at Unknown time  . urea (CARMOL) 10 % cream APPLY TOPICALLY AS NEEDED. (Patient taking differently: APPLY TOPICALLY AS NEEDED -APPLIED TO BUTTOCKS) 85 g 0 07/01/2015 at Unknown time  . valACYclovir (VALTREX) 500 MG tablet TAKE 1 TABLET BY MOUTH EVERY DAY 90 tablet 3 07/02/2015 at Unknown time  . warfarin (COUMADIN) 10 MG tablet TAKE 1/2 ON WEDNESDAY AND 1 TABLET ALL OTHER DAYS (Patient taking differently: TAKE 10MG  ON WEDNESDAYS AND SATURDAYS, THEN TAKE 5MG  ON ALL OTHER DAYS) 60 tablet 3 07/01/2015 at Unknown time  . zolpidem (AMBIEN) 5 MG tablet TAKE 1 TABLET BY MOUTH AT BEDTIME AS NEEDED FOR SLEEP 30 tablet 2 07/01/2015 at Unknown time  . aspirin EC 81 MG tablet Take 81 mg by mouth daily.   Taking  . azaTHIOprine (IMURAN) 50 MG tablet Take 50 mg by mouth 2 (two) times daily.  3 Taking  . EPINEPHrine (EPIPEN 2-PAK) 0.3 mg/0.3 mL IJ SOAJ injection Inject 0.3 mLs (0.3 mg total) into the muscle once. 2 Device 0 Taking  . escitalopram (LEXAPRO) 10 MG tablet Take 1 tablet (10 mg total) by mouth daily. 90 tablet 1 06/18/2015 at Unknown time  . ibuprofen (ADVIL,MOTRIN) 200 MG tablet Take 200 mg by mouth every 6 (six) hours as needed for headache.   Taking  . inFLIXimab (REMICADE) 100 MG injection Inject into the vein every 6 (six) weeks.    Taking  . predniSONE (DELTASONE) 20 MG tablet Take 2 tablets (40 mg total) by mouth daily with breakfast. (Patient not taking: Reported on 07/02/2015) 14 tablet 0 Not Taking at Unknown time   Assessment: 65 yo man to continue coumadin for h/o DVT.  INR below  goal, Coumadin was held initially due to SUPRAtherapeutic INR.   H/H stable.  No bleeding reported. Pt is on ibuprofen, aspirin, Imuran, bactrim and Levaquin which may interact with warfarin and make dosing challenging.    Goal of Therapy:  INR 2-3 Monitor platelets by anticoagulation protocol: Yes   Plan:  Coumadin 5mg  today Daily PT/INR Monitor for bleeding complications  Thanks for allowing pharmacy to be a part of this patient's care.  Zacarias Pontes  Lauralee Evener, PharmD Clinical Pharmacist 07/10/2015,8:45 AM

## 2015-07-11 DIAGNOSIS — C91Z0 Other lymphoid leukemia not having achieved remission: Secondary | ICD-10-CM

## 2015-07-11 DIAGNOSIS — J8410 Pulmonary fibrosis, unspecified: Secondary | ICD-10-CM

## 2015-07-11 DIAGNOSIS — B20 Human immunodeficiency virus [HIV] disease: Secondary | ICD-10-CM

## 2015-07-11 DIAGNOSIS — A419 Sepsis, unspecified organism: Secondary | ICD-10-CM

## 2015-07-11 DIAGNOSIS — M05731 Rheumatoid arthritis with rheumatoid factor of right wrist without organ or systems involvement: Secondary | ICD-10-CM

## 2015-07-11 DIAGNOSIS — R509 Fever, unspecified: Secondary | ICD-10-CM

## 2015-07-11 DIAGNOSIS — L8990 Pressure ulcer of unspecified site, unspecified stage: Secondary | ICD-10-CM | POA: Insufficient documentation

## 2015-07-11 LAB — PROTIME-INR
INR: 2.57 — ABNORMAL HIGH (ref 0.00–1.49)
Prothrombin Time: 27.3 seconds — ABNORMAL HIGH (ref 11.6–15.2)

## 2015-07-11 LAB — BASIC METABOLIC PANEL
Anion gap: 11 (ref 5–15)
BUN: 14 mg/dL (ref 6–20)
CO2: 24 mmol/L (ref 22–32)
Calcium: 8.1 mg/dL — ABNORMAL LOW (ref 8.9–10.3)
Chloride: 100 mmol/L — ABNORMAL LOW (ref 101–111)
Creatinine, Ser: 0.81 mg/dL (ref 0.61–1.24)
GFR calc Af Amer: >60 mL/min (ref >60)
GFR calc non Af Amer: >60 mL/min (ref >60)
Glucose, Bld: 158 mg/dL — ABNORMAL HIGH (ref 65–99)
Potassium: 4 mmol/L (ref 3.5–5.1)
Sodium: 135 mmol/L (ref 135–145)

## 2015-07-11 LAB — CBC
HCT: 34.9 % — ABNORMAL LOW (ref 39.0–52.0)
Hemoglobin: 11.6 g/dL — ABNORMAL LOW (ref 13.0–17.0)
MCH: 28.5 pg (ref 26.0–34.0)
MCHC: 33.2 g/dL (ref 30.0–36.0)
MCV: 85.7 fL (ref 78.0–100.0)
Platelets: 186 10*3/uL (ref 150–400)
RBC: 4.07 MIL/uL — ABNORMAL LOW (ref 4.22–5.81)
RDW: 18.1 % — ABNORMAL HIGH (ref 11.5–15.5)
WBC: 23.9 10*3/uL — ABNORMAL HIGH (ref 4.0–10.5)

## 2015-07-11 MED ORDER — SULFAMETHOXAZOLE-TRIMETHOPRIM 800-160 MG PO TABS
1.0000 | ORAL_TABLET | Freq: Two times a day (BID) | ORAL | Status: DC
  Administered 2015-07-11 – 2015-07-13 (×5): 1 via ORAL
  Filled 2015-07-11 (×5): qty 1

## 2015-07-11 MED ORDER — WARFARIN SODIUM 5 MG PO TABS
5.0000 mg | ORAL_TABLET | Freq: Once | ORAL | Status: AC
  Administered 2015-07-11: 5 mg via ORAL
  Filled 2015-07-11: qty 1

## 2015-07-11 NOTE — Progress Notes (Signed)
ANTICOAGULATION CONSULT NOTE - follow up  Pharmacy Consult for coumadin Indication: DVT  Allergies  Allergen Reactions  . Morphine Other (See Comments)    REACTION: severe headache  . Other Anaphylaxis and Hives    Pecan  . Peanut-Containing Drug Products Anaphylaxis and Hives    Swelling of throat  . Oxycodone-Acetaminophen Other (See Comments)    REACTION: headache  . Penicillins Rash    Has patient had a PCN reaction causing immediate rash, facial/tongue/throat swelling, SOB or lightheadedness with hypotension: Yes Has patient had a PCN reaction causing severe rash involving mucus membranes or skin necrosis: No Has patient had a PCN reaction that required hospitalization No Has patient had a PCN reaction occurring within the last 10 years: No If all of the above answers are "NO", then may proceed with Cephalosporin use.   REACTION: red, flushed  . Promethazine Hcl Other (See Comments)    REACTION: makes him feel drunk   Patient Measurements: Height: 5\' 9"  (175.3 cm) Weight: 190 lb 0.6 oz (86.2 kg) IBW/kg (Calculated) : 70.7  Vital Signs: Temp: 97.8 F (36.6 C) (03/01 0448) Temp Source: Oral (03/01 0448) BP: 107/59 mmHg (03/01 0448) Pulse Rate: 83 (03/01 0448)  Labs:  Recent Labs  07/09/15 0539 07/10/15 0612 07/11/15 0606  HGB  --  13.4 11.6*  HCT  --  39.8 34.9*  PLT  --  166 186  LABPROT 19.0* 21.3* 27.3*  INR 1.59* 1.85* 2.57*  CREATININE 0.64  --  0.81   Estimated Creatinine Clearance: 96.3 mL/min (by C-G formula based on Cr of 0.81).  Medical History: Past Medical History  Diagnosis Date  . Osteoarthritis, knee     s/p B TKA  . HIV infection ([place]) dx 1993  . TIA (transient ischemic attack) 1997    mild residual L mouth droop  . H/O hiatal hernia 2008    surgery  . Gynecomastia, male   . Impotence of organic origin   . Rheumatoid arthritis(714.0) dx 2010    MTX, follows with rheum  . Gout   . Chronic back pain     follows with Nsurg  .  Seasonal allergies   . Diverticulosis   . Gallstones   . Status post dilation of esophageal narrowing   . GERD (gastroesophageal reflux disease)   . Diverticulosis   . Esophagitis   . Hemorrhoids   . Tubular adenoma of colon   . Myocardial infarction ([place]) 2010     x 2  . Hypertension   . DVT, lower extremity, recurrent ([place]) 2008, 2009    LLE, chronic anticoag since 2009  . Carotid artery occlusion     40-60% right ICA stenosis (09/2008)  . Neuromuscular disorder (HCC)     neuropathy  . Fibromyalgia   . Hepatitis A yrs ago  . CLL (chronic lymphoblastic leukemia) dx 2010    Followed at [facility] q36mo, no current therapy   . Secondary syphilis 07/24/14 dx    s/p 2 wks doxy  . Type II diabetes mellitus with manifestations ([place]) 04/02/2011  . Coronary artery disease 2010    s/p CABG '10, sees Dr. Percival Spanish   Medications:  Prescriptions prior to admission  Medication Sig Dispense Refill Last Dose  . atorvastatin (LIPITOR) 10 MG tablet Take 1 tablet (10 mg total) by mouth at bedtime. (Patient taking differently: Take 10 mg by mouth every morning. ) 90 tablet 3 07/02/2015 at Unknown time  . escitalopram (LEXAPRO) 10 MG tablet Take 10 mg by mouth  daily.   07/02/2015 at Unknown time  . furosemide (LASIX) 20 MG tablet Take 1 tablet (20 mg total) by mouth 2 (two) times daily. (Patient taking differently: Take 20 mg by mouth every evening. ) 60 tablet 5 07/02/2015 at Unknown time  . GENVOYA 150-150-200-10 MG TABS tablet TAKE 1 TABLET BY MOUTH DAILY WITH BREAKFAST. 30 tablet 5 07/02/2015 at Unknown time  . levofloxacin (LEVAQUIN) 750 MG tablet Take 1 tablet (750 mg total) by mouth daily. 20 tablet 0 07/02/2015 at Unknown time  . loratadine (CLARITIN) 10 MG tablet Take 1 tablet (10 mg total) by mouth daily. 90 tablet 3 07/02/2015 at Unknown time  . LYRICA 200 MG capsule TAKE 1 CAPSULE BY MOUTH TWICE DAILY 60 capsule 5 07/02/2015 at Unknown time  . metoprolol succinate (TOPROL-XL) 25 MG 24 hr tablet Take 1  tablet (25 mg total) by mouth at bedtime. 30 tablet 11 07/02/2015 at 0830  . pantoprazole (PROTONIX) 40 MG tablet TAKE 1 TABLET EVERY DAY 60 tablet 0 07/02/2015 at Unknown time  . PREZISTA 800 MG tablet TAKE 1 TABLET (800 MG TOTAL) BY MOUTH DAILY. 30 tablet 5 07/02/2015 at Unknown time  . saccharomyces boulardii (FLORASTOR) 250 MG capsule Take 1 capsule (250 mg total) by mouth 2 (two) times daily. 60 capsule 1 07/02/2015 at Unknown time  . urea (CARMOL) 10 % cream APPLY TOPICALLY AS NEEDED. (Patient taking differently: APPLY TOPICALLY AS NEEDED -APPLIED TO BUTTOCKS) 85 g 0 07/01/2015 at Unknown time  . valACYclovir (VALTREX) 500 MG tablet TAKE 1 TABLET BY MOUTH EVERY DAY 90 tablet 3 07/02/2015 at Unknown time  . warfarin (COUMADIN) 10 MG tablet TAKE 1/2 ON WEDNESDAY AND 1 TABLET ALL OTHER DAYS (Patient taking differently: TAKE 10MG  ON WEDNESDAYS AND SATURDAYS, THEN TAKE 5MG  ON ALL OTHER DAYS) 60 tablet 3 07/01/2015 at Unknown time  . zolpidem (AMBIEN) 5 MG tablet TAKE 1 TABLET BY MOUTH AT BEDTIME AS NEEDED FOR SLEEP 30 tablet 2 07/01/2015 at Unknown time  . aspirin EC 81 MG tablet Take 81 mg by mouth daily.   Taking  . azaTHIOprine (IMURAN) 50 MG tablet Take 50 mg by mouth 2 (two) times daily.  3 Taking  . EPINEPHrine (EPIPEN 2-PAK) 0.3 mg/0.3 mL IJ SOAJ injection Inject 0.3 mLs (0.3 mg total) into the muscle once. 2 Device 0 Taking  . escitalopram (LEXAPRO) 10 MG tablet Take 1 tablet (10 mg total) by mouth daily. 90 tablet 1 06/18/2015 at Unknown time  . ibuprofen (ADVIL,MOTRIN) 200 MG tablet Take 200 mg by mouth every 6 (six) hours as needed for headache.   Taking  . inFLIXimab (REMICADE) 100 MG injection Inject into the vein every 6 (six) weeks.    Taking  . predniSONE (DELTASONE) 20 MG tablet Take 2 tablets (40 mg total) by mouth daily with breakfast. (Patient not taking: Reported on 07/02/2015) 14 tablet 0 Not Taking at Unknown time   Assessment: 65 yo man to continue coumadin for h/o DVT.   INR  therapeutic  Goal of Therapy:  INR 2-3 Monitor platelets by anticoagulation protocol: Yes   Plan:  Coumadin 5mg  today Daily PT/INR Monitor for bleeding complications  Thanks for allowing pharmacy to be a part of this patient's care. Anette Guarneri, PharmD [phone] 07/11/2015,9:27 AM

## 2015-07-11 NOTE — Care Management Important Message (Signed)
Important Message  Patient Details  Name: [name] MRN: [id number] Date of Birth: 1950/07/12   Medicare Important Message Given:  Yes    [name] Abena 07/11/2015, 10:20 AM

## 2015-07-11 NOTE — Progress Notes (Signed)
[facility] PROGRESS NOTE  [name] [id number] DOB: 01/06/1951 DOA: 07/02/2015 PCP: [name], MD  Subjective: No fever or chills, although his WBC is 23.9 today. He reported feeling much better after initiation of Bactrim.  Assessment/Plan:  Recurrent/persistent pneumonia -Bilateral pneumonia with upper lobe dominance per CT scan. -Treated with multiple courses of antibiotics to spiking fevers, treated with Levaquin and vancomycin/aztreonam. -ID recommended vancomycin/aztreonam for at least 5 days, did well on that and when transitioned to Levaquin developed fevers. -currently there is on levofloxacin and Bactrim, afebrile. -transferred to [name] for the Support of [name].  Pulmonary fibrosis Patient seen [name] on 05/12/15, diagnosed with postinflammatory fibrosis. According to the note fever and as IP associated with rheumatoid arthritis. Patient seen [name], he is on methotrexate, placed on as Azathioprine.  HIV -Continue antiretrovirals. -controlled HIV infection with CD4 count of 870  Rheumatoid arthritis -Continue methotrexate  CLL Diagnosed in 2007, [facility].   Code Status: Full code Family Communication:  Disposition Plan: Transfer to [facility] today   Consultants:  Telephone consultation with infectious diseases, Dr. Megan Salon and Dr. Linus Salmons   Antibiotics:  Levaquin   Bactrim   Objective: Filed Vitals:   07/10/15 1215 07/10/15 1651 07/10/15 2154 07/11/15 0448  BP: 120/69 133/75 139/76 107/59  Pulse: 102 96 91 83  Temp: 98 F (36.7 C) 98.1 F (36.7 C) 98.1 F (36.7 C) 97.8 F (36.6 C)  TempSrc: Oral Oral Oral Oral  Resp: 20 18 18 20   Height:  5\' 9"  (1.753 m)    Weight:  86.2 kg (190 lb 0.6 oz)    SpO2: 95% 84% 96% 93%    Intake/Output Summary (Last 24 hours) at 07/11/15 1222 Last data filed at 07/11/15 1129  Gross per 24 hour  Intake    222 ml  Output   1175 ml  Net    -953 ml   Filed Weights   07/02/15 1152 07/02/15 1654 07/10/15 1651  Weight: 86.183 kg (190 lb) 88.2 kg (194 lb 7.1 oz) 86.2 kg (190 lb 0.6 oz)    Exam:   General:  Alert, awake, oriented 3  Cardiovascular: Regular rate and rhythm, no murmurs, rubs or gallops  Respiratory: Mild bibasilar rhonchi  Abdomen: Soft, nontender, nondistended, positive bowel sounds  Extremities: No clubbing, cyanosis or edema, positive pulses   Neurologic:  Grossly intact and nonfocal  Data Reviewed: Basic Metabolic Panel:  Recent Labs Lab 07/06/15 0648 07/09/15 0539 07/11/15 0606  NA 130* 135 135  K 3.7 3.7 4.0  CL 102 101 100*  CO2 21* 26 24  GLUCOSE 96 92 158*  BUN 12 12 14   CREATININE 0.80 0.64 0.81  CALCIUM 7.2* 7.5* 8.1*   Liver Function Tests: No results for input(s): AST, ALT, ALKPHOS, BILITOT, PROT, ALBUMIN in the last 168 hours. No results for input(s): LIPASE, AMYLASE in the last 168 hours. No results for input(s): AMMONIA in the last 168 hours. CBC:  Recent Labs Lab 07/05/15 0612 07/06/15 0648 07/10/15 0612 07/11/15 0606  WBC 16.3* 17.3* 14.2* 23.9*  HGB 12.8* 12.6* 13.4 11.6*  HCT 37.6* 37.3* 39.8 34.9*  MCV 84.9 85.0 86.7 85.7  PLT 120* 130* 166 186   Cardiac Enzymes: No results for input(s): CKTOTAL, CKMB, CKMBINDEX, TROPONINI in the last 168 hours. BNP (last 3 results)  Recent Labs  04/20/15 1153  BNP 164.7*    ProBNP (last 3 results)  Recent Labs  04/19/15 1717 04/26/15 1653  PROBNP 189.0* 182.0*    CBG: No results for input(s): GLUCAP in the last 168 hours.  Recent Results (from the past 240 hour(s))  Culture, blood (routine x 2)     Status: None   Collection Time: 07/02/15 12:09 PM  Result Value Ref Range Status   Specimen Description BLOOD LEFT HAND DRAWN BY RN EW  Final   Special Requests BOTTLES DRAWN AEROBIC AND ANAEROBIC 4CC EACH  Final   Culture NO GROWTH 6 DAYS  Final   Report Status 07/08/2015 FINAL  Final  Culture, blood  (routine x 2)     Status: None   Collection Time: 07/02/15 12:12 PM  Result Value Ref Range Status   Specimen Description BLOOD RIGHT ANTECUBITAL  Final   Special Requests BOTTLES DRAWN AEROBIC AND ANAEROBIC [place]  Final   Culture NO GROWTH 6 DAYS  Final   Report Status 07/08/2015 FINAL  Final  Urine culture     Status: None   Collection Time: 07/02/15  3:20 PM  Result Value Ref Range Status   Specimen Description URINE, CLEAN CATCH  Final   Special Requests NONE  Final   Culture   Final    1,000 COLONIES/mL INSIGNIFICANT GROWTH Performed at [facility]    Report Status 07/03/2015 FINAL  Final  Culture, blood (Routine X 2) w Reflex to ID Panel     Status: None   Collection Time: 07/03/15  7:30 PM  Result Value Ref Range Status   Specimen Description BLOOD LEFT ARM  Final   Special Requests BOTTLES DRAWN AEROBIC AND ANAEROBIC 6CC  Final   Culture NO GROWTH 5 DAYS  Final   Report Status 07/08/2015 FINAL  Final  Culture, blood (Routine X 2) w Reflex to ID Panel     Status: None   Collection Time: 07/03/15  7:33 PM  Result Value Ref Range Status   Specimen Description BLOOD LEFT ARM  Final   Special Requests BOTTLES DRAWN AEROBIC AND ANAEROBIC [place]  Final   Culture NO GROWTH 5 DAYS  Final   Report Status 07/08/2015 FINAL  Final     Studies: No results found.  Scheduled Meds: . aspirin EC  81 mg Oral Daily  . atorvastatin  10 mg Oral q1800  . azaTHIOprine  50 mg Oral BID  . Darunavir Ethanolate  800 mg Oral Q breakfast  . elvitegravir-cobicistat-emtricitabine-tenofovir  1 tablet Oral Q breakfast  . escitalopram  10 mg Oral Daily  . levofloxacin  750 mg Oral Daily  . loratadine  10 mg Oral Daily  . methylPREDNISolone (SOLU-MEDROL) injection  60 mg Intravenous Q6H  . metoprolol succinate  25 mg Oral QHS  . pantoprazole  40 mg Oral Daily  . pregabalin  200 mg Oral BID  . saccharomyces boulardii  250 mg Oral BID  . sodium chloride flush  3 mL Intravenous Q12H  .  sodium chloride flush  3 mL Intravenous Q12H  . sulfamethoxazole-trimethoprim  1 tablet Oral Q12H  . valACYclovir  500 mg Oral Daily  . warfarin  5 mg Oral ONCE-1800  . Warfarin - Pharmacist Dosing Inpatient   Does not apply Q24H   Continuous Infusions:   Principal Problem:   Fever Active Problems:   HIV disease (HCC)   Rheumatoid arthritis ([place])   Chronic lymphoblastic leukemia (HCC)   Postinflammatory pulmonary fibrosis (HCC)   Pressure ulcer    Time spent: 25 minutes. Greater than 50% of this time was spent in direct contact with  the patient coordinating care.    [facility]  [facility] Pager [phone]  If 7PM-7AM, please contact night-coverage at [email or web address], password [facility] 07/11/2015, 12:22 PM  LOS: 9 days

## 2015-07-11 NOTE — Care Management Note (Signed)
Case Management Note  Patient Details  Name: [name] MRN: [id number] Date of Birth: 09-11-1950  Subjective/Objective:         Transferred from [place]  with Recurrent/persistent pneumonia, Hx of HIV, RA. From home alone, independent with ADL's. DME: cane. PCP: [name].       Action/Plan: ID following. Return to home with medically stable.CM to f/u with disposition needs.  Expected Discharge Date:                  Expected Discharge Plan:  Home/Self Care  In-House Referral:     Discharge planning Services  CM Consult  Post Acute Care Choice:    Choice offered to:     DME Arranged:    DME Agency:     HH Arranged:    [place] Agency:     Status of Service:  Completed, signed off  Medicare Important Message Given:  Yes Date Medicare IM Given:    Medicare IM give by:    Date Additional Medicare IM Given:    Additional Medicare Important Message give by:     If discussed at [place] of Stay Meetings, dates discussed:    Additional Comments:  Sharin Mons, [place] [phone] 07/11/2015, 12:24 PM

## 2015-07-12 ENCOUNTER — Encounter (HOSPITAL_COMMUNITY): Payer: Self-pay | Admitting: Student

## 2015-07-12 LAB — PROTIME-INR
INR: 2.91 — ABNORMAL HIGH (ref 0.00–1.49)
Prothrombin Time: 29.9 seconds — ABNORMAL HIGH (ref 11.6–15.2)

## 2015-07-12 MED ORDER — WARFARIN SODIUM 2.5 MG PO TABS
2.5000 mg | ORAL_TABLET | Freq: Once | ORAL | Status: AC
  Administered 2015-07-12: 2.5 mg via ORAL
  Filled 2015-07-12: qty 1

## 2015-07-12 NOTE — Progress Notes (Signed)
SATURATION QUALIFICATIONS: (This note is used to comply with regulatory documentation for home oxygen)  Patient Saturations on Room Air at Rest = 87%  Patient Saturations on Room Air while Ambulating = 83%  Patient Saturations on 2 Liters of oxygen while Ambulating = 94%  Please briefly explain why patient needs home oxygen: O2 removed before walking, sats dropped to 87% on RA at rest, sats dropped as low as 83% while walking, some SOB, had to rest and apply O2, tried 1L with sats only up to 89%, increased to 2L, sats gradually came up to 92-94%. Pt assisted back to room with O2 on.

## 2015-07-12 NOTE — Progress Notes (Signed)
ANTICOAGULATION CONSULT NOTE - follow up  Pharmacy Consult for Coumadin Indication: DVT  Allergies  Allergen Reactions  . Morphine Other (See Comments)    REACTION: severe headache  . Other Anaphylaxis and Hives    Pecan  . Peanut-Containing Drug Products Anaphylaxis and Hives    Swelling of throat  . Oxycodone-Acetaminophen Other (See Comments)    REACTION: headache  . Penicillins Rash    Has patient had a PCN reaction causing immediate rash, facial/tongue/throat swelling, SOB or lightheadedness with hypotension: Yes Has patient had a PCN reaction causing severe rash involving mucus membranes or skin necrosis: No Has patient had a PCN reaction that required hospitalization No Has patient had a PCN reaction occurring within the last 10 years: No If all of the above answers are "NO", then may proceed with Cephalosporin use.   REACTION: red, flushed  . Promethazine Hcl Other (See Comments)    REACTION: makes him feel drunk   Patient Measurements: Height: 5\' 9"  (175.3 cm) Weight: 190 lb 0.6 oz (86.2 kg) IBW/kg (Calculated) : 70.7  Vital Signs: Temp: 97.8 F (36.6 C) (03/02 0622) Temp Source: Oral (03/02 0622) BP: 103/54 mmHg (03/02 0622) Pulse Rate: 74 (03/02 0622)  Labs:  Recent Labs  07/10/15 0612 07/11/15 0606 07/12/15 0636  HGB 13.4 11.6*  --   HCT 39.8 34.9*  --   PLT 166 186  --   LABPROT 21.3* 27.3* 29.9*  INR 1.85* 2.57* 2.91*  CREATININE  --  0.81  --    Estimated Creatinine Clearance: 96.3 mL/min (by C-G formula based on Cr of 0.81).   Assessment: 65 yo man to continue coumadin for h/o DVT.   INR therapeutic  Goal of Therapy:  INR 2-3 Monitor platelets by anticoagulation protocol: Yes   Plan:  Coumadin 2.5mg  today Daily PT/INR Monitor for bleeding complications   Monitor INR closely while on Bactrim therapy  Thanks for allowing pharmacy to be a part of this patient's care. Anette Guarneri, PharmD [phone] 07/12/2015,11:49 AM

## 2015-07-12 NOTE — Progress Notes (Signed)
[facility] PROGRESS NOTE  [name] [id number] DOB: [birth year] DOA: 07/02/2015 PCP: [name], MD  Subjective: - Patient is semirecumbent in bed. Alert and conversational; pleasant.  - Patient feels well, no fevers, no SOB. On 2LNC today: SpO2: 94% - Patient's condition continues to improve.  Assessment/Plan:  Recurrent/persistent pneumonia -Bilateral pneumonia with upper lobe dominance per CT scan 07/04/2015. -Treated with multiple courses of antibiotics to spiking fevers, treated with Levaquin and vancomycin/aztreonam.  -ID recommended vancomycin/aztreonam for at least 5 days, did well on that and when transitioned to Levaquin developed fevers. Began Bactrim. -currently he is on levofloxacin and Bactrim, afebrile.  -transferred to [facility] for the Support of [place].  Pulmonary fibrosis - Patient seen [name] on 05/12/15, diagnosed with pulmonary fibrosis secondary to RA. Patient noticed symptoms of pulmonary fibrosis (mainly DOE) the summer prior to diagnosis. - Patient seen [name], he is on methotrexate, placed on as Azathioprine. - On high dose steroids   HIV -Continue antiretrovirals. -Controlled on HIV infection with CD4 count of 870  Rheumatoid arthritis -Continue methotrexate  CLL - Diagnosed in 2007, [facility]. - White count higher than normal (for him) today (23.9) secondary to Solu-Medrol 60 mg q6h  Code Status: Full Family Communication: None at bedside Disposition Plan: Home when ready   Consultants:  Telephone consultation with infectious diseases, Dr. Megan Salon and Dr. Linus Salmons  Procedures: None.  Antibiotics:  Aztreonam 07/02/2015 >>07/08/2015  Vancomycin 07/02/2015 >> 07/08/2015  Levaquin 07/08/2015 >>  Bactrim  07/11/2015 >>    Objective: Filed Vitals:   07/11/15 2147 07/12/15 0622  BP: 127/72 103/54  Pulse: 76 74  Temp: 97.9 F (36.6 C) 97.8 F (36.6 C)  Resp: 18 18     Intake/Output Summary (Last 24 hours) at 07/12/15 0945 Last data filed at 07/12/15 0900  Gross per 24 hour  Intake 293.17 ml  Output   1400 ml  Net -1106.83 ml   Filed Weights   07/02/15 1152 07/02/15 1654 07/10/15 1651  Weight: 86.183 kg (190 lb) 88.2 kg (194 lb 7.1 oz) 86.2 kg (190 lb 0.6 oz)    Exam:   General: Alert, awake, oriented 3  Cardiovascular: Regular rate and rhythm; normal S1, hard S2; no murmurs, rubs or gallops  Respiratory: Mild bibasilar crackles; no wheezes   Abdomen: Soft, nontender, nondistended, positive bowel sounds  Extremities: No clubbing, cyanosis or edema, positive pulses. [place] and bouchard's nodes present throughout. Strength 5/5 bilaterally.   Neurologic: Grossly intact and nonfocal  Data Reviewed: Basic Metabolic Panel:  Recent Labs Lab 07/06/15 0648 07/09/15 0539 07/11/15 0606  NA 130* 135 135  K 3.7 3.7 4.0  CL 102 101 100*  CO2 21* 26 24  GLUCOSE 96 92 158*  BUN 12 12 14   CREATININE 0.80 0.64 0.81  CALCIUM 7.2* 7.5* 8.1*   Liver Function Tests: No results for input(s): AST, ALT, ALKPHOS, BILITOT, PROT, ALBUMIN in the last 168 hours. No results for input(s): LIPASE, AMYLASE in the last 168 hours. No results for input(s): AMMONIA in the last 168 hours. CBC:  Recent Labs Lab 07/06/15 0648 07/10/15 0612 07/11/15 0606  WBC 17.3* 14.2* 23.9*  HGB 12.6* 13.4 11.6*  HCT 37.3* 39.8 34.9*  MCV 85.0 86.7 85.7  PLT 130* 166 186   Cardiac Enzymes: No results for input(s): CKTOTAL, CKMB, CKMBINDEX, TROPONINI in the last 168 hours. BNP (last 3 results)  Recent Labs  04/20/15 1153  BNP 164.7*    ProBNP (  last 3 results)  Recent Labs  04/19/15 1717 04/26/15 1653  PROBNP 189.0* 182.0*    CBG: No results for input(s): GLUCAP in the last 168 hours.  Recent Results (from the past 240 hour(s))  Culture, blood (routine x 2)     Status: None   Collection Time: 07/02/15 12:09 PM  Result Value Ref Range Status    Specimen Description BLOOD LEFT HAND DRAWN BY RN EW  Final   Special Requests BOTTLES DRAWN AEROBIC AND ANAEROBIC 4CC EACH  Final   Culture NO GROWTH 6 DAYS  Final   Report Status 07/08/2015 FINAL  Final  Culture, blood (routine x 2)     Status: None   Collection Time: 07/02/15 12:12 PM  Result Value Ref Range Status   Specimen Description BLOOD RIGHT ANTECUBITAL  Final   Special Requests BOTTLES DRAWN AEROBIC AND ANAEROBIC [place]  Final   Culture NO GROWTH 6 DAYS  Final   Report Status 07/08/2015 FINAL  Final  Urine culture     Status: None   Collection Time: 07/02/15  3:20 PM  Result Value Ref Range Status   Specimen Description URINE, CLEAN CATCH  Final   Special Requests NONE  Final   Culture   Final    1,000 COLONIES/mL INSIGNIFICANT GROWTH Performed at [facility]    Report Status 07/03/2015 FINAL  Final  Culture, blood (Routine X 2) w Reflex to ID Panel     Status: None   Collection Time: 07/03/15  7:30 PM  Result Value Ref Range Status   Specimen Description BLOOD LEFT ARM  Final   Special Requests BOTTLES DRAWN AEROBIC AND ANAEROBIC 6CC  Final   Culture NO GROWTH 5 DAYS  Final   Report Status 07/08/2015 FINAL  Final  Culture, blood (Routine X 2) w Reflex to ID Panel     Status: None   Collection Time: 07/03/15  7:33 PM  Result Value Ref Range Status   Specimen Description BLOOD LEFT ARM  Final   Special Requests BOTTLES DRAWN AEROBIC AND ANAEROBIC [place]  Final   Culture NO GROWTH 5 DAYS  Final   Report Status 07/08/2015 FINAL  Final     Studies: No results found.  Scheduled Meds: . aspirin EC  81 mg Oral Daily  . atorvastatin  10 mg Oral q1800  . azaTHIOprine  50 mg Oral BID  . Darunavir Ethanolate  800 mg Oral Q breakfast  . elvitegravir-cobicistat-emtricitabine-tenofovir  1 tablet Oral Q breakfast  . escitalopram  10 mg Oral Daily  . levofloxacin  750 mg Oral Daily  . loratadine  10 mg Oral Daily  . methylPREDNISolone (SOLU-MEDROL) injection  60  mg Intravenous Q6H  . metoprolol succinate  25 mg Oral QHS  . pantoprazole  40 mg Oral Daily  . pregabalin  200 mg Oral BID  . saccharomyces boulardii  250 mg Oral BID  . sodium chloride flush  3 mL Intravenous Q12H  . sodium chloride flush  3 mL Intravenous Q12H  . sulfamethoxazole-trimethoprim  1 tablet Oral Q12H  . valACYclovir  500 mg Oral Daily  . Warfarin - Pharmacist Dosing Inpatient   Does not apply Q24H   Continuous Infusions:   Principal Problem:   Fever Active Problems:   HIV disease (HCC)   Rheumatoid arthritis ([place])   Chronic lymphoblastic leukemia (HCC)   Postinflammatory pulmonary fibrosis (HCC)   Pressure ulcer   Time spent: 25 minutes. Greater than 50% of this time was spent in  direct contact with the patient coordinating care.   Marya Amsler PA-S wrote parts of this note. [facility] Pager [phone]. If 7PM-7AM, please contact night-coverage at [email or web address], password [facility] 07/12/2015, 9:45 AM  LOS: 10 days    Birdie Hopes Pager: [phone] 07/12/2015, 3:34 PM

## 2015-07-13 ENCOUNTER — Telehealth: Payer: Self-pay | Admitting: *Deleted

## 2015-07-13 LAB — BASIC METABOLIC PANEL
Anion gap: 9 (ref 5–15)
BUN: 16 mg/dL (ref 6–20)
CO2: 26 mmol/L (ref 22–32)
Calcium: 8.4 mg/dL — ABNORMAL LOW (ref 8.9–10.3)
Chloride: 101 mmol/L (ref 101–111)
Creatinine, Ser: 0.74 mg/dL (ref 0.61–1.24)
GFR calc Af Amer: >60 mL/min (ref >60)
GFR calc non Af Amer: >60 mL/min (ref >60)
Glucose, Bld: 143 mg/dL — ABNORMAL HIGH (ref 65–99)
Potassium: 5 mmol/L (ref 3.5–5.1)
Sodium: 136 mmol/L (ref 135–145)

## 2015-07-13 LAB — CBC
HCT: 37.5 % — ABNORMAL LOW (ref 39.0–52.0)
Hemoglobin: 11.9 g/dL — ABNORMAL LOW (ref 13.0–17.0)
MCH: 27.7 pg (ref 26.0–34.0)
MCHC: 31.7 g/dL (ref 30.0–36.0)
MCV: 87.4 fL (ref 78.0–100.0)
Platelets: 192 10*3/uL (ref 150–400)
RBC: 4.29 MIL/uL (ref 4.22–5.81)
RDW: 18.7 % — ABNORMAL HIGH (ref 11.5–15.5)
WBC: 23.5 10*3/uL — ABNORMAL HIGH (ref 4.0–10.5)

## 2015-07-13 LAB — PROTIME-INR
INR: 2.92 — ABNORMAL HIGH (ref 0.00–1.49)
Prothrombin Time: 30 seconds — ABNORMAL HIGH (ref 11.6–15.2)

## 2015-07-13 MED ORDER — LEVOFLOXACIN 750 MG PO TABS: 750.0000 mg | ORAL_TABLET | Freq: Every day | ORAL | Status: DC

## 2015-07-13 MED ORDER — SULFAMETHOXAZOLE-TRIMETHOPRIM 800-160 MG PO TABS: 1.0000 | ORAL_TABLET | Freq: Two times a day (BID) | ORAL | Status: DC

## 2015-07-13 MED ORDER — WARFARIN SODIUM 2.5 MG PO TABS: 2.5000 mg | ORAL_TABLET | Freq: Once | ORAL | Status: DC

## 2015-07-13 MED ORDER — PREDNISONE 10 MG PO TABS: ORAL_TABLET | ORAL | Status: DC

## 2015-07-13 NOTE — Progress Notes (Signed)
ANTICOAGULATION CONSULT NOTE - follow up  Pharmacy Consult for Coumadin Indication: DVT  Allergies  Allergen Reactions  . Morphine Other (See Comments)    REACTION: severe headache  . Other Anaphylaxis and Hives    Pecan  . Peanut-Containing Drug Products Anaphylaxis and Hives    Swelling of throat  . Oxycodone-Acetaminophen Other (See Comments)    REACTION: headache  . Penicillins Rash    Has patient had a PCN reaction causing immediate rash, facial/tongue/throat swelling, SOB or lightheadedness with hypotension: Yes Has patient had a PCN reaction causing severe rash involving mucus membranes or skin necrosis: No Has patient had a PCN reaction that required hospitalization No Has patient had a PCN reaction occurring within the last 10 years: No If all of the above answers are "NO", then may proceed with Cephalosporin use.   REACTION: red, flushed  . Promethazine Hcl Other (See Comments)    REACTION: makes him feel drunk   Patient Measurements: Height: 5\' 9"  (175.3 cm) Weight: 190 lb 0.6 oz (86.2 kg) IBW/kg (Calculated) : 70.7  Vital Signs: Temp: 97.9 F (36.6 C) (03/03 0639) Temp Source: Oral (03/03 0639) BP: 127/73 mmHg (03/03 0639) Pulse Rate: 71 (03/03 0639)  Labs:  Recent Labs  07/11/15 0606 07/12/15 0636 07/13/15 0719  HGB 11.6*  --  11.9*  HCT 34.9*  --  37.5*  PLT 186  --  192  LABPROT 27.3* 29.9* 30.0*  INR 2.57* 2.91* 2.92*  CREATININE 0.81  --  0.74   Estimated Creatinine Clearance: 97.5 mL/min (by C-G formula based on Cr of 0.74).   Assessment: 65 yo man to continue coumadin for h/o DVT.   INR therapeutic. On levaquin and Bactrim.  No bleeding reported.  Goal of Therapy:  INR 2-3   Plan:  Repeat Coumadin 2.5mg  today Daily PT/INR Monitor for bleeding complications   Monitor INR closely while on Bactrim therapy  Thanks for allowing pharmacy to be a part of this patient's care. Eudelia Bunch, Pharm.D. [phone] 07/13/2015 1:08 PM

## 2015-07-13 NOTE — Telephone Encounter (Signed)
Transition Care Management Follow-up Telephone Call   Date discharged? 07/13/15   How have you been since you were released from the hospital? Pt states he is doing alright   Do you understand why you were in the hospital? YES   Do you understand the discharge instructions? YES   Where were you discharged to? Home   Items Reviewed:  Medications reviewed: YES  Allergies reviewed: YES  Dietary changes reviewed: YES  Referrals reviewed: YES, he stated he haven't had change to call to make f/u appt w/[name]   Functional Questionnaire:   Activities of Daily Living (ADLs):   He states he are independent in the following: ambulation, bathing and hygiene, feeding, continence, grooming, toileting and dressing States he doesn't require assistance    Any transportation issues/concerns?: NO   Any patient concerns? NO   Confirmed importance and date/time of follow-up visits scheduled YES, appt 07/23/15  Provider Appointment booked with Dr. Ronnald Ramp  Confirmed with patient if condition begins to worsen call PCP or go to the ER.  Patient was given the office number and encouraged to call back with question or concerns.  : YES

## 2015-07-13 NOTE — Discharge Summary (Signed)
Physician Discharge Summary  [name] [id number] DOB: Mar 14, 1951 DOA: 07/02/2015  PCP: Christopher Calico, MD  Admit date: 07/02/2015 Discharge date: 07/13/2015  Time spent: 40 minutes  Recommendations for Outpatient Follow-up:  1. Follow-up with primary care physician within one week. 2. Follow-up with Dr. Melvyn [name] in 1-2 weeks   Discharge Diagnoses:  Principal Problem:   Fever Active Problems:   HIV disease ([place])   Rheumatoid arthritis ([place])   Chronic lymphoblastic leukemia ([place])   Postinflammatory pulmonary fibrosis ([place])   Pressure ulcer   Discharge Condition: Stable  Diet recommendation: Heart healthy diet  Filed Weights   07/02/15 1152 07/02/15 1654 07/10/15 1651  Weight: 86.183 kg (190 lb) 88.2 kg (194 lb 7.1 oz) 86.2 kg (190 lb 0.6 oz)    History of present illness: As per admitting M.D. 57yom PMH HIV, RA on methotrexate, recently hospitalized for multilobar pneumonia and discharged on Levaquin and prednisone who presented with three-day history of fever, myalgias, generalized weakness. Lactic acid was within normal limits, basic metabolic panel showed mild dehydration, chest x-ray and urinalysis were unremarkable. He was treated empirically with antibiotics and referred for admission.   Patient has been compliant with Levaquin and prednisone. He completed a prednisone but still has a few days left of Levaquin. He was seen by PCP 2/16, at that time "300%" better. symptoms began 2/18 with fever, muscle aches, anorexia and generalized weakness. Symptoms progressed throughout the weekend with associated poor food intake although he has been drinking fluids. He's had difficulty performing usual activities secondary to fatigue and fell this morning after letting the dog out. He's been taking ibuprofen for fever and headache which has improved but not resolved things. No neck pain. Currently has a headache of 2/10 she feels to be minor. Breathing is okay. An episode of vomiting but  no abdominal pain. No rash or other localizing signs or symptoms. He did have influenza vaccine this season.   In the emergency department temp 102.6, HR 121, RR 32 >> now normal. BP 110/60. No hypoxia. Pertinent labs: BMP unremarkable. Lactic acid normal x2, WBC 21, INR 2.13. U/A negative.  Imaging: CXR no definite infiltrate or pulmonary edema  Hospital Course:   Recurrent/persistent pneumonia -Bilateral pneumonia with upper lobe dominance per CT scan 07/04/2015. -Treated with multiple courses of antibiotics to spiking fevers, treated with Levaquin and vancomycin/aztreonam.  -ID recommended vancomycin/aztreonam for at least 5 days, did well on that and when transitioned to Levaquin developed fevers. Began Bactrim. -Currently afebrile, continue levofloxacin for 5 more days and Bactrim for 7 more days on discharge. -Prolonged prednisone taper, follow with pulmonary as outpatient  Pulmonary fibrosis - Patient seen Dr. Melvyn [name] on 05/12/15, diagnosed with pulmonary fibrosis secondary to RA. Patient noticed symptoms of pulmonary fibrosis (mainly DOE) the summer prior to diagnosis. - Patient seen Dr. Amil [name], he is on methotrexate, placed on as Azathioprine. - Place on high-dose of steroids in the hospital was on 60 mg of Solu-Medrol every 6 hours. -On discharge prolonged prednisone taper 40 for 5 days then 30 for 5 days then 20 till he sees Dr. Melvyn [name].  HIV -Continue antiretrovirals. -Controlled on HIV infection with CD4 count of 870  Rheumatoid arthritis -Continue methotrexate  CLL - Diagnosed in 2007, [facility] - White count higher than normal (for him) today (23.9) secondary to steroids.   Procedures:  None  Consultations:  Phone consult with ID  Discharge Exam: Filed Vitals:   07/12/15 2118 07/13/15 0639  BP: 134/73  127/73  Pulse: 70 71  Temp: 98.1 F (36.7 C) 97.9 F (36.6 C)  Resp: 15 16   General: Alert and awake, oriented x3, not in any  acute distress. HEENT: anicteric sclera, pupils reactive to light and accommodation, EOMI CVS: S1-S2 clear, no murmur rubs or gallops Chest: clear to auscultation bilaterally, no wheezing, rales or rhonchi Abdomen: soft nontender, nondistended, normal bowel sounds, no organomegaly Extremities: no cyanosis, clubbing or edema noted bilaterally Neuro: Cranial nerves II-XII intact, no focal neurological deficits  Discharge Instructions   Discharge Instructions    Diet - low sodium heart healthy    Complete by:  As directed      Increase activity slowly    Complete by:  As directed           Current Discharge Medication List    START taking these medications   Details  sulfamethoxazole-trimethoprim (BACTRIM DS,SEPTRA DS) 800-160 MG tablet Take 1 tablet by mouth every 12 (twelve) hours. Qty: 14 tablet, Refills: 0      CONTINUE these medications which have CHANGED   Details  levofloxacin (LEVAQUIN) 750 MG tablet Take 1 tablet (750 mg total) by mouth daily. Qty: 5 tablet, Refills: 0    predniSONE (DELTASONE) 10 MG tablet Take 4 tabs PO for 5 days, then take 3 tabs PO for 5 days, then take 2 tabs PO daily untill follow-up with Dr. Melvyn [name]. Qty: 60 tablet, Refills: 0      CONTINUE these medications which have NOT CHANGED   Details  atorvastatin (LIPITOR) 10 MG tablet Take 1 tablet (10 mg total) by mouth at bedtime. Qty: 90 tablet, Refills: 3    escitalopram (LEXAPRO) 10 MG tablet Take 10 mg by mouth daily.    furosemide (LASIX) 20 MG tablet Take 1 tablet (20 mg total) by mouth 2 (two) times daily. Qty: 60 tablet, Refills: 5   Associated Diagnoses: Essential hypertension    GENVOYA 150-150-200-10 MG TABS tablet TAKE 1 TABLET BY MOUTH DAILY WITH BREAKFAST. Qty: 30 tablet, Refills: 5   Associated Diagnoses: Human immunodeficiency virus disease (HCC)    loratadine (CLARITIN) 10 MG tablet Take 1 tablet (10 mg total) by mouth daily. Qty: 90 tablet, Refills: 3    LYRICA 200 MG  capsule TAKE 1 CAPSULE BY MOUTH TWICE DAILY Qty: 60 capsule, Refills: 5   Associated Diagnoses: Hereditary and idiopathic peripheral neuropathy    metoprolol succinate (TOPROL-XL) 25 MG 24 hr tablet Take 1 tablet (25 mg total) by mouth at bedtime. Qty: 30 tablet, Refills: 11    pantoprazole (PROTONIX) 40 MG tablet TAKE 1 TABLET EVERY DAY Qty: 60 tablet, Refills: 0    PREZISTA 800 MG tablet TAKE 1 TABLET (800 MG TOTAL) BY MOUTH DAILY. Qty: 30 tablet, Refills: 5   Associated Diagnoses: HIV infection, symptomatic (HCC)    saccharomyces boulardii (FLORASTOR) 250 MG capsule Take 1 capsule (250 mg total) by mouth 2 (two) times daily. Qty: 60 capsule, Refills: 1   Associated Diagnoses: Other constipation    urea (CARMOL) 10 % cream APPLY TOPICALLY AS NEEDED. Qty: 85 g, Refills: 0    valACYclovir (VALTREX) 500 MG tablet TAKE 1 TABLET BY MOUTH EVERY DAY Qty: 90 tablet, Refills: 3    warfarin (COUMADIN) 10 MG tablet TAKE 1/2 ON WEDNESDAY AND 1 TABLET ALL OTHER DAYS Qty: 60 tablet, Refills: 3    zolpidem (AMBIEN) 5 MG tablet TAKE 1 TABLET BY MOUTH AT BEDTIME AS NEEDED FOR SLEEP Qty: 30 tablet, Refills: 2  aspirin EC 81 MG tablet Take 81 mg by mouth daily.    azaTHIOprine (IMURAN) 50 MG tablet Take 50 mg by mouth 2 (two) times daily. Refills: 3    EPINEPHrine (EPIPEN 2-PAK) 0.3 mg/0.3 mL IJ SOAJ injection Inject 0.3 mLs (0.3 mg total) into the muscle once. Qty: 2 Device, Refills: 0    ibuprofen (ADVIL,MOTRIN) 200 MG tablet Take 200 mg by mouth every 6 (six) hours as needed for headache.    inFLIXimab (REMICADE) 100 MG injection Inject into the vein every 6 (six) weeks.        Allergies  Allergen Reactions  . Morphine Other (See Comments)    REACTION: severe headache  . Other Anaphylaxis and Hives    Pecan  . Peanut-Containing Drug Products Anaphylaxis and Hives    Swelling of throat  . Oxycodone-Acetaminophen Other (See Comments)    REACTION: headache  . Penicillins Rash     Has patient had a PCN reaction causing immediate rash, facial/tongue/throat swelling, SOB or lightheadedness with hypotension: Yes Has patient had a PCN reaction causing severe rash involving mucus membranes or skin necrosis: No Has patient had a PCN reaction that required hospitalization No Has patient had a PCN reaction occurring within the last 10 years: No If all of the above answers are "NO", then may proceed with Cephalosporin use.   REACTION: red, flushed  . Promethazine Hcl Other (See Comments)    REACTION: makes him feel drunk   Follow-up Information    Follow up with [facility].   Why:  Home oxygen   Contact information:   [address] [place] [address] [phone]       Follow up with Christinia Gully, MD In 1 week.   Specialty:  Pulmonary Disease   Contact information:   [address] [place] [address] [phone]        The results of significant diagnostics from this hospitalization (including imaging, microbiology, ancillary and laboratory) are listed below for reference.    Significant Diagnostic Studies: Dg Chest 2 View  07/02/2015  CLINICAL DATA:  Vomiting, generalized weakness, high fever, aching all over EXAM: CHEST  2 VIEW COMPARISON:  06/18/2015 and 06/19/2015 FINDINGS: Cardiomediastinal silhouette is stable. Bilateral streaky lower lobe atelectasis, scarring or chronic ground-glass opacities are again noted right greater than left. No definite superimposed infiltrate or pulmonary edema. Status post median sternotomy. Stable mild compression deformity in upper lumbar spine just above the prior posterior lumbar fusion. Stable granuloma in right midlung. IMPRESSION: Bilateral streaky lower lobe atelectasis, scarring or chronic ground-glass opacities again noted right greater than left. No definite superimposed infiltrate or pulmonary edema. Electronically Signed   By: Lahoma Crocker M.D.   On: 07/02/2015 13:05   Dg Chest 2  View  06/18/2015  CLINICAL DATA:  Body aches with fever with for 4 days. Cough. Multiple prior surgeries. EXAM: CHEST  2 VIEW COMPARISON:  CT 05/11/2015.  Radiographs 04/26/2015. FINDINGS: The heart size and mediastinal contours are stable status post median sternotomy and CABG. Patchy left-greater-than-right ground-glass opacities are not significantly changed from the recent prior studies. There is no confluent airspace opacity, significant pleural effusion or pneumothorax. Right lung granuloma noted. There are postsurgical changes at the right humeral head and in the lumbar spine. IMPRESSION: Stable bilateral ground-glass opacities as evaluated on CT 5 weeks ago. No new findings identified. Electronically Signed   By: Richardean Sale M.D.   On: 06/18/2015 12:06   Ct Chest  W Contrast  07/04/2015  CLINICAL DATA:  Fever of unknown origin. Fatigue for past 2 days. HIV. Chronic lymphoblastic leukemia. EXAM: CT CHEST, ABDOMEN, AND PELVIS WITH CONTRAST TECHNIQUE: Multidetector CT imaging of the chest, abdomen and pelvis was performed following the standard protocol during bolus administration of intravenous contrast. CONTRAST:  175mL OMNIPAQUE IOHEXOL 300 MG/ML  SOLN COMPARISON:  Chest CT on 06/19/2015 and AP CT on 01/17/2015 FINDINGS: CT CHEST FINDINGS Mediastinum/Lymph Nodes: Mild mediastinal and bilateral hilar lymphadenopathy with punctate calcifications shows no significant change compared to most recent chest CT. Index lesion in subcarinal region measures 2.2 cm on image 32/series 2 is stable. No new or increased areas of lymphadenopathy are identified within the thorax. A small hiatal hernia is again demonstrated. Lungs/Pleura: Patchy bilateral ground-glass pulmonary opacity with upper lobe predominance shows interval worsening since previous study. No distinct pulmonary masses are identified. A small left pleural effusion is new since prior exam. Musculoskeletal: No chest wall mass or suspicious bone lesions  identified. CT ABDOMEN PELVIS FINDINGS Hepatobiliary: No masses or other significant abnormality. Gallbladder is unremarkable. Pancreas: No mass, inflammatory changes, or other significant abnormality. Spleen: Within normal limits in size and appearance. Adrenals/Urinary Tract: No masses identified. No evidence of hydronephrosis. Stomach/Bowel: No evidence of obstruction, inflammatory process, or abnormal fluid collections. Sigmoid diverticulosis is demonstrated, however there is no evidence of diverticulitis. Vascular/Lymphatic: Retroperitoneal lymphadenopathy is seen in aorta caval and left paraaortic spaces. This shows no significant change, with index lymph node on image 74 measuring 2.1 cm compared to 2.2 cm previously. No pathologically enlarged lymph nodes identified within the pelvis. Reproductive: No mass or other significant abnormality. Other: None. Musculoskeletal: No suspicious bone lesions identified. Lumbar spine fusion hardware again noted. IMPRESSION: No significant change in thoracic and abdominal retroperitoneal lymphadenopathy since prior exam. Interval worsening of bilateral heterogeneous airspace disease with upper lobe predominance, nonspecific but could be due to PCP pneumonia in the setting of HIV. New small left pleural effusion also noted. Stable small hiatal hernia. Colonic diverticulosis. No radiographic evidence of diverticulitis. Electronically Signed   By: Earle Gell M.D.   On: 07/04/2015 18:25   Ct Chest W Contrast  06/19/2015  CLINICAL DATA:  Dyspnea. Mildly productive cough with dark foci in the sputum. HIV positive. EXAM: CT CHEST WITH CONTRAST TECHNIQUE: Multidetector CT imaging of the chest was performed during intravenous contrast administration. CONTRAST:  17mL OMNIPAQUE IOHEXOL 300 MG/ML  SOLN COMPARISON:  Chest radiographs dated 06/18/2015 and high-resolution chest CT dated 05/11/2015. FINDINGS: Mediastinum/Lymph Nodes: Multiple enlarged mediastinal lymph nodes are  slightly smaller. These include an enlarged left superior mediastinal node with a short axis diameter of 13 mm on image number 12 of series 2, previously 20 mm. A previous right peritracheal node with a short axis diameter of 19 mm has a short axis diameter of 18 mm on image number 24 of series 2. A previously demonstrated 22 mm short axis subcarinal node continues to have a short axis diameter of 22 mm on corresponding image number 30 of series 2. An enlarged right hilar node currently has a short axis diameter of 19 mm on image number 36 of series 2, unchanged. A previously demonstrated 12 mm short axis left axillary node has a short axis diameter of 7 mm today on image number 20 of series 2. A previously demonstrated 9 mm short axis right axillary node has a short axis diameter of 5 mm today. Multiple of the enlarged lymph nodes contain punctate calcifications, unchanged. Atheromatous coronary  artery calcifications are noted as well as post CABG changes. A small to moderate-sized hiatal hernia is again demonstrated. Lungs/Pleura: Progressive patchy opacity in both lungs including all lobes, most pronounced in the lower lobes. No nodules are visualized. No pleural fluid. Upper abdomen: Multiple calcified granulomata in the spleen. Cholecystectomy clips. No enlarged lymph nodes seen. Musculoskeletal: Thoracic and lower cervical spine degenerative changes, including changes of DISH. IMPRESSION: 1. Progressive patchy multilobar opacities in both lungs, compatible with progressive infection/inflammation. 2. Mildly improved mediastinal adenopathy, stable right hilar adenopathy and significantly improved bilateral axillary adenopathy. Electronically Signed   By: Claudie Revering M.D.   On: 06/19/2015 15:00   Ct Abdomen Pelvis W Contrast  07/04/2015  CLINICAL DATA:  Fever of unknown origin. Fatigue for past 2 days. HIV. Chronic lymphoblastic leukemia. EXAM: CT CHEST, ABDOMEN, AND PELVIS WITH CONTRAST TECHNIQUE:  Multidetector CT imaging of the chest, abdomen and pelvis was performed following the standard protocol during bolus administration of intravenous contrast. CONTRAST:  186mL OMNIPAQUE IOHEXOL 300 MG/ML  SOLN COMPARISON:  Chest CT on 06/19/2015 and AP CT on 01/17/2015 FINDINGS: CT CHEST FINDINGS Mediastinum/Lymph Nodes: Mild mediastinal and bilateral hilar lymphadenopathy with punctate calcifications shows no significant change compared to most recent chest CT. Index lesion in subcarinal region measures 2.2 cm on image 32/series 2 is stable. No new or increased areas of lymphadenopathy are identified within the thorax. A small hiatal hernia is again demonstrated. Lungs/Pleura: Patchy bilateral ground-glass pulmonary opacity with upper lobe predominance shows interval worsening since previous study. No distinct pulmonary masses are identified. A small left pleural effusion is new since prior exam. Musculoskeletal: No chest wall mass or suspicious bone lesions identified. CT ABDOMEN PELVIS FINDINGS Hepatobiliary: No masses or other significant abnormality. Gallbladder is unremarkable. Pancreas: No mass, inflammatory changes, or other significant abnormality. Spleen: Within normal limits in size and appearance. Adrenals/Urinary Tract: No masses identified. No evidence of hydronephrosis. Stomach/Bowel: No evidence of obstruction, inflammatory process, or abnormal fluid collections. Sigmoid diverticulosis is demonstrated, however there is no evidence of diverticulitis. Vascular/Lymphatic: Retroperitoneal lymphadenopathy is seen in aorta caval and left paraaortic spaces. This shows no significant change, with index lymph node on image 74 measuring 2.1 cm compared to 2.2 cm previously. No pathologically enlarged lymph nodes identified within the pelvis. Reproductive: No mass or other significant abnormality. Other: None. Musculoskeletal: No suspicious bone lesions identified. Lumbar spine fusion hardware again noted.  IMPRESSION: No significant change in thoracic and abdominal retroperitoneal lymphadenopathy since prior exam. Interval worsening of bilateral heterogeneous airspace disease with upper lobe predominance, nonspecific but could be due to PCP pneumonia in the setting of HIV. New small left pleural effusion also noted. Stable small hiatal hernia. Colonic diverticulosis. No radiographic evidence of diverticulitis. Electronically Signed   By: Earle Gell M.D.   On: 07/04/2015 18:25    Microbiology: Recent Results (from the past 240 hour(s))  Culture, blood (Routine X 2) w Reflex to ID Panel     Status: None   Collection Time: 07/03/15  7:30 PM  Result Value Ref Range Status   Specimen Description BLOOD LEFT ARM  Final   Special Requests BOTTLES DRAWN AEROBIC AND ANAEROBIC 6CC  Final   Culture NO GROWTH 5 DAYS  Final   Report Status 07/08/2015 FINAL  Final  Culture, blood (Routine X 2) w Reflex to ID Panel     Status: None   Collection Time: 07/03/15  7:33 PM  Result Value Ref Range Status   Specimen Description BLOOD LEFT ARM  Final   Special Requests BOTTLES DRAWN AEROBIC AND ANAEROBIC [facility]  Final   Culture NO GROWTH 5 DAYS  Final   Report Status 07/08/2015 FINAL  Final     Labs: Basic Metabolic Panel:  Recent Labs Lab 07/09/15 0539 07/11/15 0606 07/13/15 0719  NA 135 135 136  K 3.7 4.0 5.0  CL 101 100* 101  CO2 26 24 26   GLUCOSE 92 158* 143*  BUN 12 14 16   CREATININE 0.64 0.81 0.74  CALCIUM 7.5* 8.1* 8.4*   Liver Function Tests: No results for input(s): AST, ALT, ALKPHOS, BILITOT, PROT, ALBUMIN in the last 168 hours. No results for input(s): LIPASE, AMYLASE in the last 168 hours. No results for input(s): AMMONIA in the last 168 hours. CBC:  Recent Labs Lab 07/10/15 0612 07/11/15 0606 07/13/15 0719  WBC 14.2* 23.9* 23.5*  HGB 13.4 11.6* 11.9*  HCT 39.8 34.9* 37.5*  MCV 86.7 85.7 87.4  PLT 166 186 192   Cardiac Enzymes: No results for input(s): CKTOTAL, CKMB,  CKMBINDEX, TROPONINI in the last 168 hours. BNP: BNP (last 3 results)  Recent Labs  04/20/15 1153  BNP 164.7*    ProBNP (last 3 results)  Recent Labs  04/19/15 1717 04/26/15 1653  PROBNP 189.0* 182.0*    CBG: No results for input(s): GLUCAP in the last 168 hours.     Signed:  Birdie Hopes MD.  [facility] 07/13/2015, 1:36 PM

## 2015-07-13 NOTE — Progress Notes (Signed)
Patient discharge teaching given, including activity, diet, follow-up appoints, and medications. Patient verbalized understanding of all discharge instructions. IV access was d/c'd. Vitals are stable. Skin is intact except as charted in most recent assessments. Pt to be escorted out by NT, to be driven home by family. 

## 2015-07-13 NOTE — Clinical Documentation Improvement (Signed)
Internal Medicine  Can the diagnosis of pressure ulcer be further specified by site and stage and POA status? Thank you    Document if pressure ulcer with stage is Present on Admission   Document Site with laterality - Elbow, Back (upper/lower), Sacral, Hip, Buttock, Ankle, Heel, Head, Other (Specify)  Pressure Ulcer Stage - Stage1, Stage 2, Stage 3, Stage 4, Unstageable, Unspecified, Unable to Clinically Determine  Other  Clinically Undetermined    Supporting Information: Per nursing assessment from 2/28 "stage 1 on buttocks" ...noted 8 days after admission    Please exercise your independent, professional judgment when responding. A specific answer is not anticipated or expected.   Thank You,  [place] [phone]

## 2015-07-19 DIAGNOSIS — M0609 Rheumatoid arthritis without rheumatoid factor, multiple sites: Secondary | ICD-10-CM | POA: Diagnosis not present

## 2015-07-19 DIAGNOSIS — B59 Pneumocystosis: Secondary | ICD-10-CM | POA: Diagnosis not present

## 2015-07-19 DIAGNOSIS — Z21 Asymptomatic human immunodeficiency virus [HIV] infection status: Secondary | ICD-10-CM | POA: Diagnosis not present

## 2015-07-19 DIAGNOSIS — J849 Interstitial pulmonary disease, unspecified: Secondary | ICD-10-CM | POA: Diagnosis not present

## 2015-07-19 DIAGNOSIS — M1A09X0 Idiopathic chronic gout, multiple sites, without tophus (tophi): Secondary | ICD-10-CM | POA: Diagnosis not present

## 2015-07-19 DIAGNOSIS — M150 Primary generalized (osteo)arthritis: Secondary | ICD-10-CM | POA: Diagnosis not present

## 2015-07-19 DIAGNOSIS — C9110 Chronic lymphocytic leukemia of B-cell type not having achieved remission: Secondary | ICD-10-CM | POA: Diagnosis not present

## 2015-07-22 ENCOUNTER — Other Ambulatory Visit: Payer: Self-pay | Admitting: Internal Medicine

## 2015-07-22 DIAGNOSIS — B20 Human immunodeficiency virus [HIV] disease: Secondary | ICD-10-CM

## 2015-07-23 ENCOUNTER — Encounter: Payer: Self-pay | Admitting: Internal Medicine

## 2015-07-23 ENCOUNTER — Ambulatory Visit (INDEPENDENT_AMBULATORY_CARE_PROVIDER_SITE_OTHER): Payer: Medicare Other | Admitting: Internal Medicine

## 2015-07-23 VITALS — BP 130/80 | HR 81 | Temp 98.3°F | Resp 16 | Ht 69.0 in | Wt 194.0 lb

## 2015-07-23 DIAGNOSIS — J8410 Pulmonary fibrosis, unspecified: Secondary | ICD-10-CM

## 2015-07-23 DIAGNOSIS — I82409 Acute embolism and thrombosis of unspecified deep veins of unspecified lower extremity: Secondary | ICD-10-CM | POA: Diagnosis not present

## 2015-07-23 DIAGNOSIS — B59 Pneumocystosis: Secondary | ICD-10-CM

## 2015-07-23 MED ORDER — RIVAROXABAN 20 MG PO TABS: 20.0000 mg | ORAL_TABLET | Freq: Every day | ORAL | Status: DC

## 2015-07-23 MED ORDER — SULFAMETHOXAZOLE-TRIMETHOPRIM 800-160 MG PO TABS
1.0000 | ORAL_TABLET | Freq: Two times a day (BID) | ORAL | Status: DC
Start: 2015-07-23 — End: 2015-08-20

## 2015-07-23 NOTE — Patient Instructions (Signed)
Pneumocystis Jiroveci Pneumonia Pneumocystis jiroveci pneumonia is a fungal infection that affects the lungs. The infection occurs in premature infants and people who have a weakened immune system. A person may have a weakened immune system due to:   Certain cancers or cancer treatment.  HIV infection or AIDS.  Organ or bone marrow transplantation.  Treatment with corticosteroids or other medicines that cause immune suppression. CAUSES  This infection is caused by the fungal organism Pneumocystis jiroveci (once called Pneumocystis carinii). This organism is common in the environment. It is present in, or breathed into, the lungs of most people on a regular basis. It does not cause disease in people with healthy immune systems. However, when this fungus enters the lungs of a person with a weakened immune system, it can cause pneumocystis jiroveci pneumonia. SYMPTOMS  The most common symptoms are:   Fever.  Mild or dry cough.  Shortness of breath, especially with any physical activity (exertion).  Rapid breathing. In people with this pneumonia and AIDS, symptoms may develop slowly over several weeks. Other people with this pneumonia and a weakened immune system usually get symptoms more suddenly.  DIAGNOSIS  To diagnose this infection, a health care provider must examine lung secretions under a microscope to see if the organism is present. The secretions are often obtained using a flexible tube (bronchoscope) that is inserted into the lungs by going through the mouth or nose. On occasion, the organism can be identified in coughed-up saliva mixed with mucus (sputum). Other tests may include:   A chest X-ray.  Blood tests.  Taking a tissue sample of the lungs (biopsy). TREATMENT  Treatment with antibiotic medicine (and sometimes steroid medicines) is usually successful. This infection must be treated for several weeks. Because the infection is very serious, your health care provider may  have you begin treatment before test results are available. If you have a weakened immune system and you are at high risk for this infection, your health care provider may prescribe an antibiotic to prevent the infection. This treatment can prevent both relapses and new episodes of the infection. HOME CARE INSTRUCTIONS   Only take over-the-counter or prescription medicines as directed by your health care provider.  Take your antibiotics as directed. Finish them even if you start to feel better.  Avoid smoking because it can contribute to pneumonia.  Schedule and keep all follow-up visits. SEEK IMMEDIATE MEDICAL CARE IF:   Any of your symptoms are getting worse rather than better.  You have a fever or persistent symptoms for more than 2-3 days.  You have a fever and your symptoms suddenly get worse.  You have nausea, vomiting, diarrhea, or a skin rash.   This information is not intended to replace advice given to you by your health care provider. Make sure you discuss any questions you have with your health care provider.   Document Released: 07/19/2002 Document Revised: 09/12/2014 Document Reviewed: 11/25/2011 Elsevier Interactive Patient Education [employer].

## 2015-07-23 NOTE — Progress Notes (Signed)
Pre visit review using our clinic review tool, if applicable. No additional management support is needed unless otherwise documented below in the visit note. 

## 2015-07-24 NOTE — Progress Notes (Signed)
Subjective:  Patient ID: [name], male    DOB: 07/25/1950  Age: 65 y.o. MRN: [id number]  CC: Cough   HPI [name] presents for a hospital follow-up and complaint of a several day history of worsening cough productive of phlegm. He has been admitted multiple times over the last few months with respiratory complaints and abnormal lung scans. It was felt from pulmonology that he had nonspecific interstitial pneumonitis. He is also on multiple immunosuppressants and has a history of HIV infection. After several admissions and several courses of antibiotics it was noted that he responded to steroids but he also responded remarkably well to a course of Bactrim. His most recent CT scan of the lungs was consistent with pneumocystis pneumonia. He was doing well on Bactrim but his course completed about 5 days ago and soon after that his respiratory symptoms returned.   He also requests that Coumadin be changed to a NOAC. Since he has taken multiple courses of antibiotics he is having a hard time adjusting his Coumadin dosing based on his INR.  Outpatient Prescriptions Prior to Visit  Medication Sig Dispense Refill  . aspirin EC 81 MG tablet Take 81 mg by mouth daily.    [name] atorvastatin (LIPITOR) 10 MG tablet Take 1 tablet (10 mg total) by mouth at bedtime. (Patient taking differently: Take 10 mg by mouth every morning. ) 90 tablet 3  . azaTHIOprine (IMURAN) 50 MG tablet Take 50 mg by mouth 2 (two) times daily.  3  . elvitegravir-cobicistat-emtricitabine-tenofovir (GENVOYA) 150-150-200-10 MG TABS tablet Take 1 tablet by mouth daily with breakfast. 30 tablet 5  . EPINEPHrine (EPIPEN 2-PAK) 0.3 mg/0.3 mL IJ SOAJ injection Inject 0.3 mLs (0.3 mg total) into the muscle once. 2 Device 0  . escitalopram (LEXAPRO) 10 MG tablet Take 1 tablet (10 mg total) by mouth daily. 90 tablet 1  . escitalopram (LEXAPRO) 10 MG tablet Take 10 mg by mouth daily.    . furosemide (LASIX) 20 MG tablet Take 1 tablet  (20 mg total) by mouth 2 (two) times daily. (Patient taking differently: Take 20 mg by mouth every evening. ) 60 tablet 5  . inFLIXimab (REMICADE) 100 MG injection Inject into the vein every 6 (six) weeks.     [name] loratadine (CLARITIN) 10 MG tablet Take 1 tablet (10 mg total) by mouth daily. 90 tablet 3  . LYRICA 200 MG capsule TAKE 1 CAPSULE BY MOUTH TWICE DAILY 60 capsule 5  . metoprolol succinate (TOPROL-XL) 25 MG 24 hr tablet Take 1 tablet (25 mg total) by mouth at bedtime. 30 tablet 11  . pantoprazole (PROTONIX) 40 MG tablet TAKE 1 TABLET EVERY DAY 60 tablet 0  . predniSONE (DELTASONE) 10 MG tablet Take 4 tabs PO for 5 days, then take 3 tabs PO for 5 days, then take 2 tabs PO daily untill follow-up with [name]. 60 tablet 0  . PREZISTA 800 MG tablet TAKE 1 TABLET (800 MG TOTAL) BY MOUTH DAILY. 30 tablet 5  . saccharomyces boulardii (FLORASTOR) 250 MG capsule Take 1 capsule (250 mg total) by mouth 2 (two) times daily. 60 capsule 1  . urea (CARMOL) 10 % cream APPLY TOPICALLY AS NEEDED. (Patient taking differently: APPLY TOPICALLY AS NEEDED -APPLIED TO BUTTOCKS) 85 g 0  . valACYclovir (VALTREX) 500 MG tablet TAKE 1 TABLET BY MOUTH EVERY DAY 90 tablet 3  . zolpidem (AMBIEN) 5 MG tablet TAKE 1 TABLET BY MOUTH AT BEDTIME AS NEEDED FOR SLEEP 30 tablet  2  . ibuprofen (ADVIL,MOTRIN) 200 MG tablet Take 200 mg by mouth every 6 (six) hours as needed for headache.    . levofloxacin (LEVAQUIN) 750 MG tablet Take 1 tablet (750 mg total) by mouth daily. 5 tablet 0  . sulfamethoxazole-trimethoprim (BACTRIM DS,SEPTRA DS) 800-160 MG tablet Take 1 tablet by mouth every 12 (twelve) hours. 14 tablet 0  . warfarin (COUMADIN) 10 MG tablet TAKE 1/2 ON WEDNESDAY AND 1 TABLET ALL OTHER DAYS (Patient taking differently: TAKE 10MG  ON WEDNESDAYS AND SATURDAYS, THEN TAKE 5MG  ON ALL OTHER DAYS) 60 tablet 3   No facility-administered medications prior to visit.    ROS Review of Systems  Constitutional: Positive for chills.  Negative for fever, diaphoresis, activity change, appetite change, fatigue and unexpected weight change.  HENT: Negative.  Negative for congestion, sinus pressure, sore throat and trouble swallowing.   Eyes: Negative.   Respiratory: Positive for cough and shortness of breath. Negative for apnea, choking, chest tightness, wheezing and stridor.   Cardiovascular: Negative.  Negative for chest pain, palpitations and leg swelling.  Gastrointestinal: Negative.  Negative for nausea, vomiting, abdominal pain, diarrhea, constipation and blood in stool.  Endocrine: Negative.   Genitourinary: Negative.  Negative for difficulty urinating.  Musculoskeletal: Negative.   Skin: Negative.  Negative for color change and rash.  Allergic/Immunologic: Negative.   Neurological: Negative.  Negative for dizziness, syncope, numbness and headaches.  Hematological: Negative.  Negative for adenopathy. Does not bruise/bleed easily.  Psychiatric/Behavioral: Negative.     Objective:  BP 130/80 mmHg  Pulse 81  Temp(Src) 98.3 F (36.8 C) (Oral)  Resp 16  Ht 5\' 9"  (1.753 m)  Wt 194 lb (87.998 kg)  BMI 28.64 kg/m2  SpO2 97%  BP Readings from Last 3 Encounters:  07/23/15 130/80  07/13/15 126/77  06/29/15 116/74    Wt Readings from Last 3 Encounters:  07/23/15 194 lb (87.998 kg)  07/10/15 190 lb 0.6 oz (86.2 kg)  06/29/15 194 lb (87.998 kg)    Physical Exam  Constitutional: He is oriented to person, place, and time. He appears well-developed and well-nourished.  Non-toxic appearance. He does not have a sickly appearance. He does not appear ill. No distress.  HENT:  Head: Normocephalic and atraumatic.  Mouth/Throat: Oropharynx is clear and moist. No oropharyngeal exudate.  Eyes: Conjunctivae are normal. Right eye exhibits no discharge. Left eye exhibits no discharge. No scleral icterus.  Neck: Normal range of motion. Neck supple. No JVD present. No tracheal deviation present. No thyromegaly present.    Cardiovascular: Normal rate, regular rhythm, normal heart sounds and intact distal pulses.  Exam reveals no gallop and no friction rub.   No murmur heard. Pulmonary/Chest: Effort normal. No accessory muscle usage or stridor. No respiratory distress. He has no decreased breath sounds. He has no wheezes. He has rhonchi in the right upper field, the right middle field, the right lower field, the left upper field, the left middle field and the left lower field. He has no rales.  Abdominal: Soft. Bowel sounds are normal. He exhibits no distension and no mass. There is no tenderness. There is no rebound and no guarding.  Musculoskeletal: Normal range of motion. He exhibits edema (1+ pitting edema in BLE). He exhibits no tenderness.  Lymphadenopathy:    He has no cervical adenopathy.  Neurological: He is oriented to person, place, and time.  Skin: Skin is warm and dry. No rash noted. He is not diaphoretic. No erythema. No pallor.  Vitals reviewed.  Lab Results  Component Value Date   WBC 23.5* 07/13/2015   HGB 11.9* 07/13/2015   HCT 37.5* 07/13/2015   PLT 192 07/13/2015   GLUCOSE 143* 07/13/2015   CHOL 132 11/20/2014   TRIG 101 11/20/2014   HDL 30* 11/20/2014   LDLCALC 82 11/20/2014   ALT 35 07/02/2015   AST 41 07/02/2015   NA 136 07/13/2015   K 5.0 07/13/2015   CL 101 07/13/2015   CREATININE 0.74 07/13/2015   BUN 16 07/13/2015   CO2 26 07/13/2015   TSH 0.60 07/11/2014   INR 2.92* 07/13/2015   HGBA1C 4.7 09/05/2014    Dg Chest 2 View  07/02/2015  CLINICAL DATA:  Vomiting, generalized weakness, high fever, aching all over EXAM: CHEST  2 VIEW COMPARISON:  06/18/2015 and 06/19/2015 FINDINGS: Cardiomediastinal silhouette is stable. Bilateral streaky lower lobe atelectasis, scarring or chronic ground-glass opacities are again noted right greater than left. No definite superimposed infiltrate or pulmonary edema. Status post median sternotomy. Stable mild compression deformity in upper  lumbar spine just above the prior posterior lumbar fusion. Stable granuloma in right midlung. IMPRESSION: Bilateral streaky lower lobe atelectasis, scarring or chronic ground-glass opacities again noted right greater than left. No definite superimposed infiltrate or pulmonary edema. Electronically Signed   By: Lahoma Crocker M.D.   On: 07/02/2015 13:05    Assessment & Plan:   Lauri was seen today for cough.  Diagnoses and all orders for this visit:  DVT, lower extremity, recurrent, unspecified laterality ([place])- To avoid drug interactions between Coumadin and antibiotics I recommended that he change to once daily Xarelto -     rivaroxaban (XARELTO) 20 MG TABS tablet; Take 1 tablet (20 mg total) by mouth daily with supper.  Pneumonia of both upper lobes due to Pneumocystis jirovecii ([place])- for the next 2 weeks he will take Bactrim twice a day, then for suppression he will take Bactrim once a day. -     sulfamethoxazole-trimethoprim (BACTRIM DS,SEPTRA DS) 800-160 MG tablet; Take 1 tablet by mouth every 12 (twelve) hours.  Postinflammatory pulmonary fibrosis ([place])- he will continue to follow with pulmonary about this.   I have discontinued Mr. Touchton's warfarin, ibuprofen, and levofloxacin. I am also having him start on rivaroxaban. Additionally, I am having him maintain his aspirin EC, inFLIXimab, escitalopram, EPINEPHrine, pantoprazole, atorvastatin, metoprolol succinate, PREZISTA, LYRICA, saccharomyces boulardii, loratadine, valACYclovir, urea, furosemide, zolpidem, azaTHIOprine, escitalopram, predniSONE, elvitegravir-cobicistat-emtricitabine-tenofovir, and sulfamethoxazole-trimethoprim.  Meds ordered this encounter  Medications  . sulfamethoxazole-trimethoprim (BACTRIM DS,SEPTRA DS) 800-160 MG tablet    Sig: Take 1 tablet by mouth every 12 (twelve) hours.    Dispense:  28 tablet    Refill:  1  . rivaroxaban (XARELTO) 20 MG TABS tablet    Sig: Take 1 tablet (20 mg total) by mouth daily with  supper.    Dispense:  30 tablet    Refill:  5     Follow-up: Return in about 3 weeks (around 08/13/2015).  [name], MD

## 2015-07-25 ENCOUNTER — Other Ambulatory Visit: Payer: Self-pay | Admitting: Internal Medicine

## 2015-07-25 ENCOUNTER — Encounter: Payer: Self-pay | Admitting: Internal Medicine

## 2015-07-25 ENCOUNTER — Telehealth: Payer: Self-pay | Admitting: Internal Medicine

## 2015-07-25 DIAGNOSIS — J8410 Pulmonary fibrosis, unspecified: Secondary | ICD-10-CM

## 2015-07-25 DIAGNOSIS — B59 Pneumocystosis: Secondary | ICD-10-CM

## 2015-07-25 NOTE — Telephone Encounter (Signed)
Pt was in on Monday and was told to call back in a couple days if he wasn't feeling better. He's not feeling better. Can you please give him a call at [phone]

## 2015-07-25 NOTE — Telephone Encounter (Signed)
The next level of treatment for him is either a visit with pulm or  readmission

## 2015-07-25 NOTE — Telephone Encounter (Signed)
PT was seen on 3/13 please advise

## 2015-07-25 NOTE — Telephone Encounter (Signed)
Pt infoormed will opt for pulmonology apt. Pt will call to schedule

## 2015-07-31 ENCOUNTER — Encounter: Payer: Self-pay | Admitting: Internal Medicine

## 2015-07-31 ENCOUNTER — Other Ambulatory Visit: Payer: Self-pay | Admitting: Internal Medicine

## 2015-08-02 ENCOUNTER — Other Ambulatory Visit: Payer: Self-pay | Admitting: Internal Medicine

## 2015-08-02 ENCOUNTER — Other Ambulatory Visit: Payer: Self-pay | Admitting: Infectious Diseases

## 2015-08-02 DIAGNOSIS — M792 Neuralgia and neuritis, unspecified: Secondary | ICD-10-CM

## 2015-08-02 DIAGNOSIS — B20 Human immunodeficiency virus [HIV] disease: Secondary | ICD-10-CM

## 2015-08-09 ENCOUNTER — Telehealth: Payer: Self-pay | Admitting: Internal Medicine

## 2015-08-09 NOTE — Telephone Encounter (Signed)
Fine with me

## 2015-08-09 NOTE — Telephone Encounter (Signed)
That is fine 

## 2015-08-09 NOTE — Telephone Encounter (Signed)
lmomtcb x1 Needs to be scheduled with MR at next available in 30 min slot

## 2015-08-09 NOTE — Telephone Encounter (Signed)
Pt cb, i have shceduled him with MR on 09/13/15 @ 4:15pm, nothing further needed

## 2015-08-09 NOTE — Telephone Encounter (Signed)
Called spoke with pt. He reports Dr. Ronnald Ramp is wanting him to switch his care from [name] to Dr. Geoffery Spruce. He reports he feels he would be a better doc for pt pulm fibrosis per pt.  He is currently scheduled to see Dr. Vaughan Browner for a consult but pt is not wanting this. Pt last saw MW on 06/29/15. Please advise MW if you are okay with the switch? thanks

## 2015-08-09 NOTE — Telephone Encounter (Signed)
Please advise Dr. Chase Caller on the switch thanks

## 2015-08-14 ENCOUNTER — Ambulatory Visit: Payer: Self-pay | Admitting: General Practice

## 2015-08-16 ENCOUNTER — Institutional Professional Consult (permissible substitution): Payer: Self-pay | Admitting: Pulmonary Disease

## 2015-08-19 ENCOUNTER — Encounter: Payer: Self-pay | Admitting: Internal Medicine

## 2015-08-20 ENCOUNTER — Other Ambulatory Visit: Payer: Self-pay | Admitting: Internal Medicine

## 2015-08-20 ENCOUNTER — Encounter: Payer: Self-pay | Admitting: Internal Medicine

## 2015-08-20 ENCOUNTER — Other Ambulatory Visit: Payer: Self-pay | Admitting: Infectious Diseases

## 2015-08-20 ENCOUNTER — Telehealth: Payer: Self-pay | Admitting: Pharmacist

## 2015-08-20 DIAGNOSIS — I82409 Acute embolism and thrombosis of unspecified deep veins of unspecified lower extremity: Secondary | ICD-10-CM

## 2015-08-20 DIAGNOSIS — G8929 Other chronic pain: Secondary | ICD-10-CM

## 2015-08-20 DIAGNOSIS — M549 Dorsalgia, unspecified: Principal | ICD-10-CM

## 2015-08-20 DIAGNOSIS — B59 Pneumocystosis: Secondary | ICD-10-CM

## 2015-08-20 MED ORDER — SULFAMETHOXAZOLE-TRIMETHOPRIM 800-160 MG PO TABS: 1.0000 | ORAL_TABLET | Freq: Every day | ORAL | Status: DC

## 2015-08-20 MED ORDER — EDOXABAN TOSYLATE 30 MG PO TABS: 30.0000 mg | ORAL_TABLET | Freq: Every day | ORAL | Status: DC

## 2015-08-20 NOTE — Telephone Encounter (Signed)
-----   Message from Roma Schanz, [facility] sent at 08/20/2015 10:15 AM EDT ----- Regarding: Serious drug interaction Dr. Ronnald Ramp,  I am a pharmacist working at the [facility] and I have come across a serious drug interaction for [name]. He was previously on warfarin and recently switched to Xarelto due to labile INR from frequent antibiotic use. He is currently on Xarelto with Genvoya and Prezista, which is contraindicated due to increased levels of Xarelto causing very increased risk for bleed.   To continue anticoagulation for the patient's history of recurrent DVT, I recommend changing Xarelto to either Savaysa 30 mg daily or Eliquis 2.5 mg BID as soon as possible. Please note, both of these doses are decreased from their usual recommended doses due to interactions with his HIV medications - full doses should be avoided for DVT treatment. A final option would be to switch him back to warfarin.   Thank you, Joya San, PharmD

## 2015-08-20 NOTE — Telephone Encounter (Signed)
I contacted Dr. Ronnald Ramp to notify of serious DDI between Xarelto and Genvoya/Prezista causing increased risk of bleeding. [name] was recently switched from warfarin to Xarelto for labile INRs. A change of anticoagulant theraoy was recommended to [facility] or Eliquis.  Dr. Ronnald Ramp promptly responded to the message and switched [name] to Savaysa 30 mg PO daily. Dr. Ronnald Ramp also notified the patient of this change.

## 2015-08-20 NOTE — Telephone Encounter (Signed)
Thank you for the info I have changed to savaysa and have notified the pt

## 2015-08-27 DIAGNOSIS — M0609 Rheumatoid arthritis without rheumatoid factor, multiple sites: Secondary | ICD-10-CM | POA: Diagnosis not present

## 2015-08-29 ENCOUNTER — Other Ambulatory Visit: Payer: Self-pay | Admitting: Internal Medicine

## 2015-08-30 ENCOUNTER — Other Ambulatory Visit: Payer: Medicare Other

## 2015-08-30 DIAGNOSIS — Z113 Encounter for screening for infections with a predominantly sexual mode of transmission: Secondary | ICD-10-CM

## 2015-08-30 DIAGNOSIS — B20 Human immunodeficiency virus [HIV] disease: Secondary | ICD-10-CM

## 2015-08-30 DIAGNOSIS — M4120 Other idiopathic scoliosis, site unspecified: Secondary | ICD-10-CM | POA: Diagnosis not present

## 2015-08-30 DIAGNOSIS — Z79899 Other long term (current) drug therapy: Secondary | ICD-10-CM | POA: Diagnosis not present

## 2015-08-30 LAB — CBC
HCT: 39.1 % (ref 38.5–50.0)
Hemoglobin: 12.6 g/dL — ABNORMAL LOW (ref 13.2–17.1)
MCH: 30 pg (ref 27.0–33.0)
MCHC: 32.2 g/dL (ref 32.0–36.0)
MCV: 93.1 fL (ref 80.0–100.0)
MPV: 9 fL (ref 7.5–12.5)
Platelets: 176 10*3/uL (ref 140–400)
RBC: 4.2 MIL/uL (ref 4.20–5.80)
RDW: 17.4 % — ABNORMAL HIGH (ref 11.0–15.0)
WBC: 17.1 10*3/uL — ABNORMAL HIGH (ref 3.8–10.8)

## 2015-08-30 LAB — LIPID PANEL
Cholesterol: 149 mg/dL (ref 125–200)
HDL: 29 mg/dL — ABNORMAL LOW (ref >=40)
LDL Cholesterol: 90 mg/dL (ref <130)
Total CHOL/HDL Ratio: 5.1 Ratio — ABNORMAL HIGH (ref <=5.0)
Triglycerides: 151 mg/dL — ABNORMAL HIGH (ref <150)
VLDL: 30 mg/dL (ref <30)

## 2015-08-30 LAB — COMPREHENSIVE METABOLIC PANEL
ALT: 29 U/L (ref 9–46)
AST: 38 U/L — ABNORMAL HIGH (ref 10–35)
Albumin: 3.5 g/dL — ABNORMAL LOW (ref 3.6–5.1)
Alkaline Phosphatase: 84 U/L (ref 40–115)
BUN: 18 mg/dL (ref 7–25)
CO2: 25 mmol/L (ref 20–31)
Calcium: 8.4 mg/dL — ABNORMAL LOW (ref 8.6–10.3)
Chloride: 106 mmol/L (ref 98–110)
Creat: 1.26 mg/dL — ABNORMAL HIGH (ref 0.70–1.25)
Glucose, Bld: 80 mg/dL (ref 65–99)
Potassium: 4.5 mmol/L (ref 3.5–5.3)
Sodium: 143 mmol/L (ref 135–146)
Total Bilirubin: 0.7 mg/dL (ref 0.2–1.2)
Total Protein: 5.3 g/dL — ABNORMAL LOW (ref 6.1–8.1)

## 2015-08-31 ENCOUNTER — Telehealth: Payer: Self-pay | Admitting: *Deleted

## 2015-08-31 LAB — HIV-1 RNA QUANT-NO REFLEX-BLD
HIV 1 RNA Quant: <20 copies/mL (ref <20)
HIV-1 RNA Quant, Log: <1.3 Log copies/mL (ref <1.30)

## 2015-08-31 LAB — T-HELPER CELL (CD4) - (RCID CLINIC ONLY)
CD4 % Helper T Cell: 17 % — ABNORMAL LOW (ref 33–55)
CD4 T Cell Abs: 230 /uL — ABNORMAL LOW (ref 400–2700)

## 2015-08-31 NOTE — Telephone Encounter (Signed)
-----   Message from Campbell Riches, MD sent at 08/31/2015  9:15 AM EDT ----- He needs a repeat Cr next week thanks ----- Message -----    From: Lab in [facility] [place] [facility]    Sent: 08/30/2015   9:29 PM      To: Campbell Riches, MD

## 2015-08-31 NOTE — Telephone Encounter (Signed)
Patient called back and scheduled a lab appt for 09/06/15. [name]

## 2015-08-31 NOTE — Telephone Encounter (Signed)
Left patient a voice mail to call the clinic for results. [name]

## 2015-09-01 LAB — RPR: RPR Ser Ql: REACTIVE — AB

## 2015-09-01 LAB — RPR TITER: RPR Titer: 1:1 {titer}

## 2015-09-03 DIAGNOSIS — H20012 Primary iridocyclitis, left eye: Secondary | ICD-10-CM | POA: Diagnosis not present

## 2015-09-03 LAB — FLUORESCENT TREPONEMAL AB(FTA)-IGG-BLD: Fluorescent Treponemal ABS: REACTIVE — AB

## 2015-09-06 ENCOUNTER — Other Ambulatory Visit: Payer: Medicare Other

## 2015-09-06 DIAGNOSIS — B20 Human immunodeficiency virus [HIV] disease: Secondary | ICD-10-CM | POA: Diagnosis not present

## 2015-09-06 LAB — BASIC METABOLIC PANEL
BUN: 21 mg/dL (ref 7–25)
CO2: 25 mmol/L (ref 20–31)
Calcium: 8.8 mg/dL (ref 8.6–10.3)
Chloride: 105 mmol/L (ref 98–110)
Creat: 1.08 mg/dL (ref 0.70–1.25)
Glucose, Bld: 76 mg/dL (ref 65–99)
Potassium: 4.5 mmol/L (ref 3.5–5.3)
Sodium: 140 mmol/L (ref 135–146)

## 2015-09-12 ENCOUNTER — Ambulatory Visit (INDEPENDENT_AMBULATORY_CARE_PROVIDER_SITE_OTHER): Payer: Medicare Other | Admitting: Infectious Diseases

## 2015-09-12 VITALS — BP 109/75 | HR 72 | Temp 97.7°F | Wt 193.5 lb

## 2015-09-12 DIAGNOSIS — L22 Diaper dermatitis: Secondary | ICD-10-CM | POA: Diagnosis not present

## 2015-09-12 DIAGNOSIS — J8410 Pulmonary fibrosis, unspecified: Secondary | ICD-10-CM

## 2015-09-12 DIAGNOSIS — M05731 Rheumatoid arthritis with rheumatoid factor of right wrist without organ or systems involvement: Secondary | ICD-10-CM

## 2015-09-12 DIAGNOSIS — H20012 Primary iridocyclitis, left eye: Secondary | ICD-10-CM | POA: Diagnosis not present

## 2015-09-12 DIAGNOSIS — B20 Human immunodeficiency virus [HIV] disease: Secondary | ICD-10-CM

## 2015-09-12 DIAGNOSIS — B372 Candidiasis of skin and nail: Secondary | ICD-10-CM | POA: Diagnosis not present

## 2015-09-12 DIAGNOSIS — B356 Tinea cruris: Secondary | ICD-10-CM | POA: Insufficient documentation

## 2015-09-12 MED ORDER — FLUCONAZOLE 100 MG PO TABS: 100.0000 mg | ORAL_TABLET | Freq: Every day | ORAL | Status: DC

## 2015-09-12 NOTE — Assessment & Plan Note (Signed)
Appreciate CCM f/u.

## 2015-09-12 NOTE — Assessment & Plan Note (Signed)
Short course of diflucan.

## 2015-09-12 NOTE — Assessment & Plan Note (Signed)
On mtx.

## 2015-09-12 NOTE — Assessment & Plan Note (Addendum)
Will continue his current art Suspect Cd4 down due to recent illnesses.  See him back in 4 months.  Checked with pharm regarding use of diflucan for his rash, will be ok short term.

## 2015-09-12 NOTE — Progress Notes (Signed)
   Subjective:    Patient ID: [name], male    DOB: [birth year], 65 y.o.   MRN: [id number]  HPI 65 yo M with hx of HIV+, CLL, pulmonary fibrosis, and RA for which he takes MTX.  He was hospitallized in [place] For [place] and then returned on 2-20 --> 3-3 with fever, muscle aches and weakness. He was treated with vanco/aztreonam for 5 days then d/c home with bactrim and levaquin.   He is currently on genvoya/drv.   HIV 1 RNA QUANT (copies/mL)  Date Value  08/30/2015 <20  07/06/2015 100  11/20/2014 49*   CD4 T CELL ABS (/uL)  Date Value  08/30/2015 230*  07/06/2015 870  11/20/2014 1400    Is breathing much better. Has seen pulmonary in f/u. His arthritis is "now in my lungs and now into my eye". Was started on prednisone drops. Has ophtho f/u this pm.  Has c/o about rash on upper gluteal fold, rectal burning, rash in R groin.    Review of Systems  Constitutional: Negative for appetite change and unexpected weight change.  Eyes: Positive for visual disturbance.  Respiratory: Negative for cough.   Gastrointestinal: Negative for diarrhea and constipation.  Genitourinary: Negative for difficulty urinating.  Musculoskeletal: Positive for arthralgias.   +DOE    Objective:   Physical Exam  Constitutional: He appears well-developed and well-nourished.  HENT:  Mouth/Throat: No oropharyngeal exudate.  Eyes: EOM are normal. Pupils are equal, round, and reactive to light.  Neck: Neck supple.  Cardiovascular: Normal rate, regular rhythm and normal heart sounds.   Pulmonary/Chest: Effort normal and breath sounds normal.  Abdominal: Soft. Bowel sounds are normal. There is no tenderness. There is no rebound.  Musculoskeletal: He exhibits edema.  Lymphadenopathy:    He has no cervical adenopathy.  Skin:         Assessment & Plan:

## 2015-09-13 ENCOUNTER — Encounter: Payer: Self-pay | Admitting: Internal Medicine

## 2015-09-13 ENCOUNTER — Ambulatory Visit (INDEPENDENT_AMBULATORY_CARE_PROVIDER_SITE_OTHER): Payer: Medicare Other | Admitting: Internal Medicine

## 2015-09-13 VITALS — BP 112/60 | HR 91 | Ht 69.0 in | Wt 195.0 lb

## 2015-09-13 DIAGNOSIS — D849 Immunodeficiency, unspecified: Secondary | ICD-10-CM | POA: Diagnosis not present

## 2015-09-13 DIAGNOSIS — J849 Interstitial pulmonary disease, unspecified: Secondary | ICD-10-CM

## 2015-09-13 DIAGNOSIS — D899 Disorder involving the immune mechanism, unspecified: Secondary | ICD-10-CM

## 2015-09-13 NOTE — Patient Instructions (Signed)
ICD-9-CM ICD-10-CM   1. ILD (interstitial lung disease) ([place]) 515 J84.9   2. Immunocompromised state ([place]) 279.3 D84.9     contionue night o2 Start portable o2 with exertion > 60 yards -  Do HRCT Chest wo contrast in 2 weeks - will call with results  followup  - will call with HRCT chest results and decide on next step which is continued observation v bronchoscopy with lavage

## 2015-09-13 NOTE — Progress Notes (Signed)
Subjective:     Patient ID: [name], male   DOB: 1950-08-14, 65 y.o.   MRN: [id number]  HPI  Brief patient profile:  75 yowm never smoker with longstanding deforming RA followed by Dr [name] referrred 05/11/2015 by Dr Ronnald Ramp for ILD     History of Present Illness  05/11/2015 1st [facility] office visit/ [facility]   Chief Complaint  Patient presents with  . Pulmonary Consult    Referred by Dr. [name]. Pt c/o SOB and cough for the past 3 months, worse x 1 month. He states that he is SOB with any amount of exertion. His cough is prod with clear to "really dark" sputum. He also c/o chills and sweats.   indolent onset progressive doe /coughing since late summer of 2016 but better since lasix but still sob walking aisles at [facility]/ hcp/ arthritis is worse since breathing worse - When started lasix was obviously having peirpheral edema but this has mostly resolved. rec Please see patient coordinator before you leave today  to schedule HRCT chest > c/w nsip     Admit date: 06/18/2015 Discharge date: 06/20/2015  Discharge Diagnoses:  Principal Problem:  Community acquired pneumonia Active Problems:  CAD (coronary artery disease) of artery bypass graft  Type II diabetes mellitus with manifestations (HCC)  HIV disease ([place])  Chronic lymphoblastic leukemia ([place])  Hypertension  DVT, lower extremity, recurrent ([place])  Pneumonia  Dyspnea   06/29/2015  f/u ov/[facility] re: NSIP presumably due to RA lung dz/ poor control of arthritis chronically / s/p ? CAP  Chief Complaint  Patient presents with  . Follow-up    PFT done today. Pt hospitalized from 06/18/15-06/20/15 with PNA. He feels his breathing has improved since d/c. Cough is prod wit minimal, thick, dark yellow sputum.    mucous now has turned clear p finished abx /min volume in ams  / activity tol back near baseline s resp meds   No obvious day to day or daytime variability or assoc cp or chest tightness, subjective wheeze  or overt sinus or hb symptoms. No unusual exp hx or h/o childhood pna/ asthma or knowledge of premature birth.  Sleeping ok without nocturnal  or early am exacerbation  of respiratory  c/o's or need for noct saba. Also denies any obvious fluctuation of symptoms with weather or environmental changes or other aggravating or alleviating factors except as outlined above     OV 09/13/2015  Chief Complaint  Patient presents with  . Follow-up    Pt changing providers from MW to MR for pulmonary fibrosis. Pt c/o DOE. Pt denies cough, CP/tightness.   Is a transfer of care from Dr. Legrand Como wert to Dr. Fuller Plan  65 year old male immunosuppressed on basis of HIV and rheumatoid arthritis. Given necrosis factor Alpha blockade and Imuran. He appears to have CT findings of interstitial lung disease starting in September 2016 and progressing all the way through end of February 2017. A 2015 CT chest did not show any interstitial lung disease  He reports that December 2016 he reported to pulmonary office for worsening shortness of breath. Noticed to have interstitial lung disease changes. Clinical diagnosis that interstitial lung disease was related to rheumatoid arthritis was made [his HIV viral load was low and CD4 counts were normal all the way since 2010]. This point in time he had been on infliximab for 3 years. A decision was made to add Imuran to the regimen. At this point in time he was not on  Bactrim. Subsequently he got sick early every 2017 and was admitted to the hospital with a clinical diagnosis of community acquired pneumonia and treated with antibiotics and discharged. Subsequently readmitted end of February 2017 for 10 days with hypoxemic respiratory failure. Treated with broad-spectrum antibiotics but did not improve. Finally started on Bactrim for presumed Pneumocystis carinii pneumonia according to the patient and then began to improve. This was when he had last CT scan of the chest which  comparatively shows progressive ILD changes.  At this point in time he is on immunosuppressive medications. Most recently saw Dr. Riley Churches and infectious diseases his CD4 count has dropped for the first time and he believes this is due to stress. He is compliant with this anti-retroviral therapy Genovaya. He is also on anticoagulation. Overall he is feeling much better physically stronger and less short of breath. Although he still does have shortness of breath with exertion infection in the office he did desaturate.    Walking desat test 185 feet x 3 laps on RA -> desat to 87% at 2nd lap and got dyspneic and wanted to stop    has a past medical history of Osteoarthritis, knee; HIV infection ([place]) (dx 1993); TIA (transient ischemic attack) (1997); H/O hiatal hernia (2008); Gynecomastia, male; Impotence of organic origin; Rheumatoid arthritis(714.0) (dx 2010); Gout; Chronic back pain; Seasonal allergies; Diverticulosis; Gallstones; Status post dilation of esophageal narrowing; GERD (gastroesophageal reflux disease); Diverticulosis; Esophagitis; Hemorrhoids; Tubular adenoma of colon; Myocardial infarction ([place]) (2010); Hypertension; DVT, lower extremity, recurrent ([place]) (2008, 2009); Carotid artery occlusion; Neuromuscular disorder ([place]); Fibromyalgia; Hepatitis A (yrs ago); CLL (chronic lymphoblastic leukemia) (dx 2010); Secondary syphilis (07/24/14 dx); Type II diabetes mellitus with manifestations ([place]) (04/02/2011); Coronary artery disease (2010); and HIV (human immunodeficiency virus infection) ([place]).   reports that he has never smoked. He has never used smokeless tobacco.  Past Surgical History  Procedure Laterality Date  . Spine surgery  2010    "rod and screws", "failed", lopwer spine,   . Cholecystectomy    . Hiatal hernia repair      wrap  . Shoulder surgery Left   . Mandible surgery Bilateral     tmj  . Varicose vein      stripping  . Replacement total knee Bilateral   . Knee  arthroplasty  07/22/2011    Procedure: COMPUTER ASSISTED TOTAL KNEE ARTHROPLASTY;  Surgeon: Meredith Pel, MD;  Location: [facility];  Service: Orthopedics;  Laterality: Right;  Right total knee arthroplasty  . Coronary artery bypass graft  2010    triple bypass  . Hardware removal N/A 07/02/2012    Procedure: HARDWARE REMOVAL;  Surgeon: Elaina Hoops, MD;  Location: [facility];  Service: Neurosurgery;  Laterality: N/A;  . Inguinal hernia repair Bilateral   . Umbilical hernia repair      x 1  . Rotator cuff repair Right   . Esophagogastroduodenoscopy (egd) with propofol N/A 12/28/2012    Procedure: ESOPHAGOGASTRODUODENOSCOPY (EGD) WITH PROPOFOL;  Surgeon: Jerene Bears, MD;  Location: [facility];  Service: Gastroenterology;  Laterality: N/A;  . Colonoscopy with propofol N/A 12/28/2012    Procedure: COLONOSCOPY WITH PROPOFOL;  Surgeon: Jerene Bears, MD;  Location: [facility];  Service: Gastroenterology;  Laterality: N/A;  . Joint replacement Left 1999  . Tonsillectomy    . Ring around testicle hernia reapir  184 and 1986    x 2  . Esophagogastroduodenoscopy (egd) with propofol N/A 03/15/2013    Procedure: ESOPHAGOGASTRODUODENOSCOPY (EGD) WITH  PROPOFOL;  Surgeon: Jerene Bears, MD;  Location: [facility];  Service: Gastroenterology;  Laterality: N/A;    Allergies  Allergen Reactions  . Morphine Other (See Comments)    REACTION: severe headache  . Other Anaphylaxis and Hives    Pecan  . Peanut-Containing Drug Products Anaphylaxis and Hives    Swelling of throat  . Oxycodone-Acetaminophen Other (See Comments)    REACTION: headache  . Penicillins Rash    Has patient had a PCN reaction causing immediate rash, facial/tongue/throat swelling, SOB or lightheadedness with hypotension: Yes Has patient had a PCN reaction causing severe rash involving mucus membranes or skin necrosis: No Has patient had a PCN reaction that required hospitalization No Has patient had a PCN reaction occurring  within the last 10 years: No If all of the above answers are "NO", then may proceed with Cephalosporin use.   REACTION: red, flushed  . Promethazine Hcl Other (See Comments)    REACTION: makes him feel drunk    Immunization History  Administered Date(s) Administered  . Hepatitis B, adult 09/08/2013, 10/27/2013, 03/02/2014  . Influenza Split 01/17/2011, 02/11/2012, 02/18/2013, 02/10/2015  . Influenza, High Dose Seasonal PF 02/02/2014  . Pneumococcal Conjugate-13 08/10/2014  . Pneumococcal Polysaccharide-23 01/16/2009, 10/27/2013  . Td 11/19/2011  . Zoster 03/02/2014    Family History  Problem Relation Age of Onset  . Breast cancer Mother   . Hypertension Mother   . Hyperlipidemia Mother   . Diabetes Mother   . Prostate cancer Father   . Colon polyps Father   . Hyperlipidemia Father   . Crohn's disease Paternal Aunt   . Diabetes Maternal Grandmother   . Diabetes Brother     x 3  . Heart disease Brother     x 3  . Hyperlipidemia Brother     x 3  . Colon cancer Neg Hx   . Alcohol abuse Daughter   . Drug abuse Daughter   . Asthma Brother      Current outpatient prescriptions:  .  aspirin EC 81 MG tablet, Take 81 mg by mouth daily., Disp: , Rfl:  .  atorvastatin (LIPITOR) 10 MG tablet, Take 1 tablet (10 mg total) by mouth at bedtime. (Patient taking differently: Take 10 mg by mouth every morning. ), Disp: 90 tablet, Rfl: 3 .  azaTHIOprine (IMURAN) 50 MG tablet, Take 50 mg by mouth 2 (two) times daily., Disp: , Rfl: 3 .  edoxaban (SAVAYSA) 30 MG TABS tablet, Take 1 tablet (30 mg total) by mouth daily., Disp: 30 tablet, Rfl: 3 .  elvitegravir-cobicistat-emtricitabine-tenofovir (GENVOYA) 150-150-200-10 MG TABS tablet, Take 1 tablet by mouth daily with breakfast., Disp: 30 tablet, Rfl: 5 .  EPINEPHrine (EPIPEN 2-PAK) 0.3 mg/0.3 mL IJ SOAJ injection, Inject 0.3 mLs (0.3 mg total) into the muscle once., Disp: 2 Device, Rfl: 0 .  escitalopram (LEXAPRO) 10 MG tablet, Take 10 mg  by mouth daily., Disp: , Rfl:  .  fluconazole (DIFLUCAN) 100 MG tablet, Take 1 tablet (100 mg total) by mouth daily., Disp: 10 tablet, Rfl: 0 .  furosemide (LASIX) 20 MG tablet, Take 1 tablet (20 mg total) by mouth 2 (two) times daily. (Patient taking differently: Take 20 mg by mouth every evening. ), Disp: 60 tablet, Rfl: 5 .  inFLIXimab (REMICADE) 100 MG injection, Inject into the vein every 5 (five) weeks. , Disp: , Rfl:  .  loratadine (CLARITIN) 10 MG tablet, Take 1 tablet (10 mg total) by mouth daily., Disp: 90 tablet, Rfl: 3 .  LYRICA 200 MG capsule, TAKE 1 CAPSULE BY MOUTH TWICE DAILY, Disp: 60 capsule, Rfl: 3 .  metoprolol succinate (TOPROL-XL) 25 MG 24 hr tablet, Take 1 tablet (25 mg total) by mouth at bedtime., Disp: 30 tablet, Rfl: 11 .  pantoprazole (PROTONIX) 40 MG tablet, TAKE 1 TABLET EVERY DAY, Disp: 30 tablet, Rfl: 2 .  prednisoLONE acetate (PRED FORTE) 1 % ophthalmic suspension, Place 1 drop into the left eye 4 (four) times daily., Disp: , Rfl: 1 .  PREZISTA 800 MG tablet, TAKE 1 TABLET (800 MG TOTAL) BY MOUTH DAILY., Disp: 30 tablet, Rfl: 5 .  saccharomyces boulardii (FLORASTOR) 250 MG capsule, Take 1 capsule (250 mg total) by mouth 2 (two) times daily., Disp: 60 capsule, Rfl: 1 .  sulfamethoxazole-trimethoprim (BACTRIM DS,SEPTRA DS) 800-160 MG tablet, Take 1 tablet by mouth daily., Disp: 30 tablet, Rfl: 3 .  urea (CARMOL) 10 % cream, APPLY TOPICALLY AS NEEDED., Disp: 85 g, Rfl: 0 .  valACYclovir (VALTREX) 500 MG tablet, TAKE 1 TABLET BY MOUTH EVERY DAY, Disp: 90 tablet, Rfl: 3 .  zolpidem (AMBIEN) 5 MG tablet, TAKE 1 TABLET BY MOUTH AT BEDTIME AS NEEDED FOR SLEEP, Disp: 30 tablet, Rfl: 2    Review of Systems     Objective:   Physical Exam  Constitutional: He is oriented to person, place, and time. He appears well-developed and well-nourished. No distress.  HENT:  Head: Normocephalic and atraumatic.  Right Ear: External ear normal.  Left Ear: External ear normal.   Mouth/Throat: Oropharynx is clear and moist. No oropharyngeal exudate.  Eyes: Conjunctivae and EOM are normal. Pupils are equal, round, and reactive to light. Right eye exhibits no discharge. Left eye exhibits no discharge. No scleral icterus.  Neck: Normal range of motion. Neck supple. No JVD present. No tracheal deviation present. No thyromegaly present.  Cardiovascular: Normal rate, regular rhythm and intact distal pulses.  Exam reveals no gallop and no friction rub.   No murmur heard. Pulmonary/Chest: Effort normal and breath sounds normal. No respiratory distress. He has no wheezes. He has no rales. He exhibits no tenderness.  Abdominal: Soft. Bowel sounds are normal. He exhibits no distension and no mass. There is no tenderness. There is no rebound and no guarding.  Musculoskeletal: Normal range of motion. He exhibits no edema or tenderness.  Kyphotic Walks with a cane  Lymphadenopathy:    He has no cervical adenopathy.  Neurological: He is alert and oriented to person, place, and time. He has normal reflexes. No cranial nerve deficit. Coordination normal.  Skin: Skin is warm and dry. No rash noted. He is not diaphoretic. No erythema. No pallor.  Psychiatric: He has a normal mood and affect. His behavior is normal. Judgment and thought content normal.  Nursing note and vitals reviewed.   Filed Vitals:   09/13/15 1631  BP: 112/60  Pulse: 91  Height: 5\' 9"  (1.753 m)  Weight: 195 lb (88.451 kg)  SpO2: 96%        Assessment:       ICD-9-CM ICD-10-CM   1. ILD (interstitial lung disease) ([place]) 515 J84.9 Ambulatory Referral for DME     CT Chest High Resolution  2. Immunocompromised state ([place]) 279.3 D84.9        Plan:      I think a bronchoscopy with lavage is indicated to rule out other opportunistic infections given his extremely immunosuppressed state. However he is saying he is feeling better. It is been more than 8 weeks and he  had his last CT scan of the chest sizing  the prudent course of action is to get another CT scan of the chest in a few weeks. If ILD changes are continuing to improve then we can watch. However if asthma changes are continuing to get worse then we'll have her do a bronchoscopy with lavage. If this is nondiagnostic then he will proceed to surgical lung biopsy. He is willing and agreeable to this plan.  In the interim we will start him on portable oxygen with exertion but I have advised him to continue his nocturnal oxygen.   Dr. Brand Males, M.D., [facility].C.P Pulmonary and Critical Care Medicine Staff Physician [place] [facility] Pager: [phone], If no answer or between  15:00h - 7:00h: call [phone]  09/13/2015 5:43 PM

## 2015-09-14 ENCOUNTER — Encounter: Payer: Self-pay | Admitting: Internal Medicine

## 2015-09-19 ENCOUNTER — Encounter: Payer: Self-pay | Admitting: Infectious Diseases

## 2015-09-20 ENCOUNTER — Encounter: Payer: Self-pay | Admitting: Pharmacist Clinician (PhC)/ Clinical Pharmacy Specialist

## 2015-09-20 DIAGNOSIS — B59 Pneumocystosis: Secondary | ICD-10-CM | POA: Diagnosis not present

## 2015-09-20 DIAGNOSIS — M150 Primary generalized (osteo)arthritis: Secondary | ICD-10-CM | POA: Diagnosis not present

## 2015-09-20 DIAGNOSIS — M1A09X0 Idiopathic chronic gout, multiple sites, without tophus (tophi): Secondary | ICD-10-CM | POA: Diagnosis not present

## 2015-09-20 DIAGNOSIS — Z21 Asymptomatic human immunodeficiency virus [HIV] infection status: Secondary | ICD-10-CM | POA: Diagnosis not present

## 2015-09-20 DIAGNOSIS — J849 Interstitial pulmonary disease, unspecified: Secondary | ICD-10-CM | POA: Diagnosis not present

## 2015-09-20 DIAGNOSIS — M0609 Rheumatoid arthritis without rheumatoid factor, multiple sites: Secondary | ICD-10-CM | POA: Diagnosis not present

## 2015-09-20 DIAGNOSIS — C9110 Chronic lymphocytic leukemia of B-cell type not having achieved remission: Secondary | ICD-10-CM | POA: Diagnosis not present

## 2015-09-25 ENCOUNTER — Other Ambulatory Visit: Payer: Self-pay | Admitting: Pharmacist Clinician (PhC)/ Clinical Pharmacy Specialist

## 2015-09-25 ENCOUNTER — Telehealth: Payer: Self-pay | Admitting: Pharmacist Clinician (PhC)/ Clinical Pharmacy Specialist

## 2015-09-25 DIAGNOSIS — B20 Human immunodeficiency virus [HIV] disease: Secondary | ICD-10-CM

## 2015-09-25 MED ORDER — FLUCONAZOLE 100 MG PO TABS: 100.0000 mg | ORAL_TABLET | Freq: Every day | ORAL | Status: DC

## 2015-09-25 NOTE — Telephone Encounter (Signed)
[name] called about the rash issue that is in his groin. He just finished his fluconazole about 2 days (10d). Stated that it's better but still there. He also complains about the pain/redness in his buttocks. No sores per pt. Sounds more like nerve pain and he is lyrica for it. D/w Dr. [name], we are going to extend his fluconazole x 7 more days. He is picking up the OTC silverdene at [facility] now for his groin issue. This will cover for both fungal and bacterial.

## 2015-09-27 ENCOUNTER — Ambulatory Visit (INDEPENDENT_AMBULATORY_CARE_PROVIDER_SITE_OTHER)
Admission: RE | Admit: 2015-09-27 | Discharge: 2015-09-27 | Disposition: A | Discharge status: Home / Self Care | Payer: Medicare Other | Source: Ambulatory Visit | Attending: Internal Medicine | Admitting: Internal Medicine

## 2015-09-27 DIAGNOSIS — J849 Interstitial pulmonary disease, unspecified: Secondary | ICD-10-CM | POA: Diagnosis not present

## 2015-09-27 DIAGNOSIS — R0600 Dyspnea, unspecified: Secondary | ICD-10-CM | POA: Diagnosis not present

## 2015-09-28 ENCOUNTER — Telehealth: Payer: Self-pay | Admitting: Internal Medicine

## 2015-09-28 NOTE — Telephone Encounter (Signed)
LEt ZANE SANJURJO know that CT chest These fibrotic findings are generally stable compared to the most recent CT scan from 07/04/2015, but have clearly increased compared to 05/29/2013.   I recommend bronch with BAL to start off. I can do it in June. Pls let me know his response because he has no fu with me right now    Ct Chest High Resolution  09/27/2015  CLINICAL DATA:  65 year old male with history of dyspnea on exertion. History of rheumatoid arthritis. Evaluate for interstitial lung disease. EXAM: CT CHEST WITHOUT CONTRAST TECHNIQUE: Multidetector CT imaging of the chest was performed following the standard protocol without intravenous contrast. High resolution imaging of the lungs, as well as inspiratory and expiratory imaging, was performed. COMPARISON:  CT of the chest, abdomen and pelvis 07/04/2015. FINDINGS: Mediastinum/Lymph Nodes: Heart size is borderline enlarged. There is no significant pericardial fluid, thickening or pericardial calcification. There is atherosclerosis of the thoracic aorta, the great vessels of the mediastinum and the coronary arteries, including calcified atherosclerotic plaque in the left main, left anterior descending, left circumflex and right coronary arteries. Status post median sternotomy for CABG, including LIMA to the LAD. Coronary artery stent in the left anterior descending coronary artery. Curvilinear hypoattenuation involving portions of the anterior and septal wall the left ventricle from the mid ventricle to the base, compatible with areas of fibro fatty metaplasia from prior LAD territory myocardial infarction. No pathologically enlarged mediastinal or hilar lymph nodes. Numerous calcified mediastinal and bilateral hilar lymph nodes. Moderate size hiatal hernia. Surgical clips near the gastroesophageal junction. No axillary lymphadenopathy. Lungs/Pleura: Compared to the prior study the previously noted ground-glass attenuation has significantly diminished  throughout the lungs bilaterally, but has not completely resolved. There are patchy areas of septal thickening and subpleural reticulation with scattered regions of mild cylindrical bronchiectasis and a few areas with mild peripheral bronchiolectasis. No frank honeycombing is identified. These fibrotic findings are generally stable compared to the most recent CT scan from 07/04/2015, but have clearly increased compared to remote prior study 05/29/2013. No clear cut craniocaudal gradient. Inspiratory and expiratory imaging demonstrates moderate air trapping, indicative of small airways disease. No acute consolidative airspace disease. No pleural effusions. Scarring in the inferior segment of the lingula. No definite suspicious appearing pulmonary nodules or masses. A few scattered small calcified granulomas are noted. Upper abdomen: Rim calcified structure in the upper abdomen adjacent to the greater curvature of the proximal stomach, stable in appearance compared to prior studies, presumably a focus of fat necrosis. Small calcified granulomas throughout the liver and spleen. Musculoskeletal: There are no aggressive appearing lytic or blastic lesions noted in the visualized portions of the skeleton. Median sternotomy wires. IMPRESSION: 1. The appearance of the lungs is compatible with interstitial lung disease. Given the predominance of ground-glass attenuation, lack of definitive craniocaudal gradient, and absence of honeycombing, this is favored to represent nonspecific interstitial pneumonia (NSIP) as a manifestation of rheumatoid lung disease. However, these findings are progressive compared to remote prior study 05/19/2013, and early changes of usual interstitial pneumonia (UIP) are not excluded. Continued attention on follow-up high-resolution chest CT in 1 year is recommended. 2. Atherosclerosis, including left main and 3 vessel coronary artery disease, with evidence of prior LAD territory myocardial  infarction, as above. Status post median sternotomy for CABG, including LIMA to the LAD. 3. Sequela of old granulomatous disease, as above. 4. Additional incidental findings, as above. Electronically Signed   By: Vinnie Langton M.D.   On: 09/27/2015 11:15

## 2015-10-02 NOTE — Telephone Encounter (Signed)
Spoke with pt, aware of results/recs.  Pt states he is ok with proceeding with bronch/BAL.  MR please advise when in June this can be done.  Thanks!

## 2015-10-04 DIAGNOSIS — M0609 Rheumatoid arthritis without rheumatoid factor, multiple sites: Secondary | ICD-10-CM | POA: Diagnosis not present

## 2015-10-04 NOTE — Telephone Encounter (Signed)
Spoke with Claiborne Billings in [place]  She took the pt information, but her computer is down and will have to call back later to schedule

## 2015-10-04 NOTE — Telephone Encounter (Signed)
Tuesday 6/6/ 7/.30am ok. Ensreu [place], small scope. No tb risk. No fluor. Just BAL

## 2015-10-04 NOTE — Telephone Encounter (Signed)
LMOM TCB x1 for Tara @ respiratory

## 2015-10-04 NOTE — Telephone Encounter (Signed)
Week of June 5th. Early AM 7.30 at [facility] long. What dates do they have?

## 2015-10-04 NOTE — Telephone Encounter (Signed)
Spoke with [name] with Resp. She is aware of below per MR and confirmed bronch is scheduled at [facility]. Will sign off and route to MR as FYI.

## 2015-10-04 NOTE — Telephone Encounter (Signed)
[name] cb [phone]

## 2015-10-04 NOTE — Telephone Encounter (Signed)
Spoke with [name] She states that they can do Tues June 6th at 7:30 am  I told her to go ahead and schedule  Pt is aware and okay with this date  I just need to let [name] know if fluro is needed  Please advise thanks

## 2015-10-09 ENCOUNTER — Encounter: Payer: Self-pay | Admitting: Internal Medicine

## 2015-10-09 NOTE — Telephone Encounter (Signed)
Dr Chase Caller please advised of any medication modifications for upcoming Bronch.  Pt is currently on Savaysa. Thanks.

## 2015-10-10 NOTE — Telephone Encounter (Signed)
Good question. Typicaly no need to because is only lavage and no biopsy. Having said that becauise I go throuigh the nose sometime there is rik for nose bleed. sO best he stop it for 24h. Is it for DVT hx? If so, when was DVT?

## 2015-10-16 ENCOUNTER — Telehealth: Payer: Self-pay | Admitting: Internal Medicine

## 2015-10-16 ENCOUNTER — Ambulatory Visit (HOSPITAL_COMMUNITY)
Admission: RE | Admit: 2015-10-16 | Discharge: 2015-10-16 | Disposition: A | Discharge status: Home / Self Care | Payer: Medicare Other | Source: Ambulatory Visit | Attending: Internal Medicine | Admitting: Internal Medicine

## 2015-10-16 ENCOUNTER — Encounter (HOSPITAL_COMMUNITY)
Admission: RE | Disposition: A | Discharge status: Home / Self Care | Payer: Self-pay | Source: Ambulatory Visit | Attending: Internal Medicine

## 2015-10-16 ENCOUNTER — Encounter (HOSPITAL_COMMUNITY): Payer: Self-pay | Admitting: Internal Medicine

## 2015-10-16 DIAGNOSIS — J849 Interstitial pulmonary disease, unspecified: Secondary | ICD-10-CM | POA: Diagnosis not present

## 2015-10-16 DIAGNOSIS — I25810 Atherosclerosis of coronary artery bypass graft(s) without angina pectoris: Secondary | ICD-10-CM | POA: Insufficient documentation

## 2015-10-16 DIAGNOSIS — E785 Hyperlipidemia, unspecified: Secondary | ICD-10-CM | POA: Diagnosis not present

## 2015-10-16 DIAGNOSIS — M069 Rheumatoid arthritis, unspecified: Secondary | ICD-10-CM | POA: Diagnosis not present

## 2015-10-16 DIAGNOSIS — Z7982 Long term (current) use of aspirin: Secondary | ICD-10-CM | POA: Diagnosis not present

## 2015-10-16 DIAGNOSIS — K219 Gastro-esophageal reflux disease without esophagitis: Secondary | ICD-10-CM | POA: Diagnosis not present

## 2015-10-16 DIAGNOSIS — Z8673 Personal history of transient ischemic attack (TIA), and cerebral infarction without residual deficits: Secondary | ICD-10-CM | POA: Diagnosis not present

## 2015-10-16 DIAGNOSIS — M1710 Unilateral primary osteoarthritis, unspecified knee: Secondary | ICD-10-CM | POA: Diagnosis not present

## 2015-10-16 DIAGNOSIS — D849 Immunodeficiency, unspecified: Secondary | ICD-10-CM | POA: Diagnosis present

## 2015-10-16 DIAGNOSIS — Z96653 Presence of artificial knee joint, bilateral: Secondary | ICD-10-CM | POA: Diagnosis not present

## 2015-10-16 DIAGNOSIS — I6529 Occlusion and stenosis of unspecified carotid artery: Secondary | ICD-10-CM | POA: Diagnosis not present

## 2015-10-16 DIAGNOSIS — Z856 Personal history of leukemia: Secondary | ICD-10-CM | POA: Diagnosis not present

## 2015-10-16 DIAGNOSIS — Z86718 Personal history of other venous thrombosis and embolism: Secondary | ICD-10-CM | POA: Diagnosis not present

## 2015-10-16 DIAGNOSIS — R918 Other nonspecific abnormal finding of lung field: Secondary | ICD-10-CM | POA: Diagnosis not present

## 2015-10-16 DIAGNOSIS — F329 Major depressive disorder, single episode, unspecified: Secondary | ICD-10-CM | POA: Diagnosis not present

## 2015-10-16 DIAGNOSIS — Z21 Asymptomatic human immunodeficiency virus [HIV] infection status: Secondary | ICD-10-CM | POA: Diagnosis present

## 2015-10-16 DIAGNOSIS — J309 Allergic rhinitis, unspecified: Secondary | ICD-10-CM | POA: Insufficient documentation

## 2015-10-16 DIAGNOSIS — I10 Essential (primary) hypertension: Secondary | ICD-10-CM | POA: Insufficient documentation

## 2015-10-16 DIAGNOSIS — E1140 Type 2 diabetes mellitus with diabetic neuropathy, unspecified: Secondary | ICD-10-CM | POA: Insufficient documentation

## 2015-10-16 DIAGNOSIS — Z79899 Other long term (current) drug therapy: Secondary | ICD-10-CM | POA: Insufficient documentation

## 2015-10-16 DIAGNOSIS — I252 Old myocardial infarction: Secondary | ICD-10-CM | POA: Diagnosis not present

## 2015-10-16 DIAGNOSIS — Z951 Presence of aortocoronary bypass graft: Secondary | ICD-10-CM | POA: Diagnosis not present

## 2015-10-16 DIAGNOSIS — B20 Human immunodeficiency virus [HIV] disease: Secondary | ICD-10-CM | POA: Diagnosis present

## 2015-10-16 DIAGNOSIS — R848 Other abnormal findings in specimens from respiratory organs and thorax: Secondary | ICD-10-CM | POA: Diagnosis not present

## 2015-10-16 DIAGNOSIS — D899 Disorder involving the immune mechanism, unspecified: Secondary | ICD-10-CM

## 2015-10-16 HISTORY — PX: VIDEO BRONCHOSCOPY: SHX5072

## 2015-10-16 LAB — BODY FLUID CELL COUNT WITH DIFFERENTIAL
Eos, Fluid: 10 %
Lymphs, Fluid: 55 %
Monocyte-Macrophage-Serous Fluid: 23 % — ABNORMAL LOW (ref 50–90)
Neutrophil Count, Fluid: 12 % (ref 0–25)
Total Nucleated Cell Count, Fluid: 115 cu mm (ref 0–1000)

## 2015-10-16 SURGERY — VIDEO BRONCHOSCOPY WITHOUT FLUORO
Anesthesia: Moderate Sedation | Laterality: Bilateral

## 2015-10-16 MED ORDER — MIDAZOLAM HCL 5 MG/ML IJ SOLN
INTRAMUSCULAR | Status: AC
  Filled 2015-10-16: qty 2

## 2015-10-16 MED ORDER — SODIUM CHLORIDE 0.9 % IV SOLN
INTRAVENOUS | Status: DC
  Administered 2015-10-16: 07:00:00 via INTRAVENOUS

## 2015-10-16 MED ORDER — PHENYLEPHRINE HCL 0.25 % NA SOLN
1.0000 | Freq: Four times a day (QID) | NASAL | Status: DC | PRN
Start: 2015-10-16 — End: 2015-10-16

## 2015-10-16 MED ORDER — LIDOCAINE HCL 2 % EX GEL
CUTANEOUS | Status: DC | PRN
  Administered 2015-10-16: 1

## 2015-10-16 MED ORDER — LIDOCAINE HCL 2 % EX GEL: 1.0000 "application " | Freq: Once | CUTANEOUS | Status: DC

## 2015-10-16 MED ORDER — PHENYLEPHRINE HCL 0.25 % NA SOLN
NASAL | Status: DC | PRN
  Administered 2015-10-16: 2 via NASAL

## 2015-10-16 MED ORDER — SODIUM CHLORIDE 0.9 % IV SOLN: Freq: Once | INTRAVENOUS | Status: DC

## 2015-10-16 MED ORDER — FENTANYL CITRATE (PF) 100 MCG/2ML IJ SOLN
INTRAMUSCULAR | Status: DC | PRN
  Administered 2015-10-16 (×2): 25 ug via INTRAVENOUS

## 2015-10-16 MED ORDER — SODIUM CHLORIDE 0.9 % IV BOLUS (SEPSIS): 500.0000 mL | Freq: Once | INTRAVENOUS | Status: DC

## 2015-10-16 MED ORDER — FENTANYL CITRATE (PF) 100 MCG/2ML IJ SOLN
INTRAMUSCULAR | Status: AC
  Filled 2015-10-16: qty 4

## 2015-10-16 MED ORDER — MIDAZOLAM HCL 10 MG/2ML IJ SOLN
INTRAMUSCULAR | Status: DC | PRN
  Administered 2015-10-16: 2 mg via INTRAVENOUS

## 2015-10-16 MED ORDER — LIDOCAINE HCL 1 % IJ SOLN
INTRAMUSCULAR | Status: DC | PRN
  Administered 2015-10-16: 6 mL via RESPIRATORY_TRACT

## 2015-10-16 MED ORDER — BUTAMBEN-TETRACAINE-BENZOCAINE 2-2-14 % EX AERO: 1.0000 | INHALATION_SPRAY | Freq: Once | CUTANEOUS | Status: DC

## 2015-10-16 NOTE — Telephone Encounter (Signed)
[name]  pls give fu in7-10 days with TP  To discuss post BAL discussion of results. This is for [name]   Thanks  Dr. Brand Males, M.D., [facility].C.P Pulmonary and Critical Care Medicine Staff Physician [place] [facility] Pager: [phone], If no answer or between  15:00h - 7:00h: call [phone]  10/16/2015 8:12 AM

## 2015-10-16 NOTE — Discharge Instructions (Signed)
Flexible Bronchoscopy, Care After These instructions give you information on caring for yourself after your procedure. Your doctor may also give you more specific instructions. Call your doctor if you have any problems or questions after your procedure. HOME CARE  Do not eat or drink anything for 2 hours after your procedure. If you try to eat or drink before the medicine wears off, food or drink could go into your lungs. You could also burn yourself.  After 2 hours have passed and when you can cough and gag normally, you may eat soft food and drink liquids slowly.  The day after the test, you may eat your normal diet.  You may do your normal activities.  Keep all doctor visits. GET HELP RIGHT AWAY IF:  You get more and more short of breath.  You get light-headed.  You feel like you are going to pass out (faint).  You have chest pain.  You have new problems that worry you.  You cough up more than a little blood.  You cough up more blood than before. MAKE SURE YOU:  Understand these instructions.  Will watch your condition.  Will get help right away if you are not doing well or get worse.   This information is not intended to replace advice given to you by your health care provider. Make sure you discuss any questions you have with your health care provider.   Document Released: 02/23/2009 Document Revised: 05/03/2013 Document Reviewed: 12/31/2012 Elsevier Interactive Patient Education 2016 [place] not eat or drink until after - 12.30pm today 10/16/15

## 2015-10-16 NOTE — Progress Notes (Signed)
Video bronchoscopy performed Intervention bronchial washings Pt tolerated well  Caridad Silveira David RRT  

## 2015-10-16 NOTE — Op Note (Signed)
Name:  [name] MRN:  [id number] DOB:  17-Apr-1951  PROCEDURE NOTE  Procedure(s): Flexible bronchoscopy [id number]) Bronchial alveolar lavage [phone]) of the LINGULA   Indications:HIV, ILD, Rheumatoid Arthritis, Immunocmpromised, Pulmonary infiltrates  Consent:  Procedure, benefits, risks and alternatives discussed.  Questions answered.  Consent obtained.  Anesthesia: Moderate Sedation  Procedure summary:  Appropriate equipment was assembled.  The patient was brought to the operating room and identified as [name].  Safety timeout was performed. The patient was placed supine on the operating table, airway established and general anesthesia administered by Anesthesia team.   After the appropriate level of anesthesia was assured, flexible video bronchoscope was lubricated and inserted through the endotracheal tube.  Total of 20 mL (final used)  of 1% Lidocaine were administered through the bronchoscope to augment general anesthesia.  Airway examination was performed bilaterally to subsegmental level.  Minimal clear secretions were noted, mucosa appeared normal and no endobronchial lesions were identified. NO THRUSH IN UPPER AIRWAY. NO KAPOSI. NO ABNORMALITIES  Bronchial alveolar lavage of the LINGULA was performed 21mL x 4 aliquots  Of normal saline and return of 40 mL of fluid, after which the bronchoscope was withdrawn.  IMPRESSION 1. Normal airway 2. BAL LINGULA with cloudy grey BAL   Specimens sent: Bronchial alveolar lavage specimen of the  LINGULAR  for cell count , microbiology (PCP, bacteria, AFB and legionella)  and cytology.  Complications:  No immediate complications were noted.  Hemodynamic parameters and oxygenation remained stable throughout the procedure. Of note there was 1 transient low BP recording - 500cc saline bolus ordered out of precaution  Estimated blood loss: None   Dr. Brand Males, M.D., [facility].C.P Pulmonary and Critical Care Medicine Staff  Physician [facility] Pager: [phone], If no answer or between  15:00h - 7:00h: call [phone]  10/16/2015 8:03 AM

## 2015-10-16 NOTE — H&P (Addendum)
[name] is an 65 y.o. male.   Chief Complaint: HIV with RA and immunccompromised state and pulmonary infiltrates  HPI:    65 year old male immunosuppressed on basis of HIV and rheumatoid arthritis. Given necrosis factor Alpha blockade and Imuran. He appears to have CT findings of interstitial lung disease starting in September 2016 and progressing all the way through end of February 2017. A 2015 CT chest did not show any interstitial lung disease  He reports that December 2016 he reported to pulmonary office for worsening shortness of breath. Noticed to have interstitial lung disease changes. Clinical diagnosis that interstitial lung disease was related to rheumatoid arthritis was made [his HIV viral load was low and CD4 counts were normal all the way since 2010]. This point in time he had been on infliximab for 3 years. A decision was made to add Imuran to the regimen. At this point in time he was not on Bactrim. Subsequently he got sick early every 2017 and was admitted to the hospital with a clinical diagnosis of community acquired pneumonia and treated with antibiotics and discharged. Subsequently readmitted end of February 2017 for 10 days with hypoxemic respiratory failure. Treated with broad-spectrum antibiotics but did not improve. Finally started on Bactrim for presumed Pneumocystis carinii pneumonia according to the patient and then began to improve. This was when he had last CT scan of the chest which comparatively shows progressive ILD changes.  At this point in time he is on immunosuppressive medicationss. He is compliant with this anti-retroviral therapy Genovaya. He still does have shortness of breath with exertion infection in the office he did desaturate p 87% on 09/13/15.  On 10/16/2015 evalauted at [facility]: In interim he says he is stable. Dysypnea stable but not better.  CT chest 09/27/15 personally visalized shows persistent ILD . GGO improved since feb 2017 but pverall   worse since jan 2015 (personally visualzied). He presents for bronch with BAL. If non-diagnostic plan is for VTS surgical lung bx  His anticoagulation for DVT is on hold x 2 days per history  Past Medical History  Diagnosis Date  . Osteoarthritis, knee     s/p B TKA  . HIV infection ([place]) dx 1993  . TIA (transient ischemic attack) 1997    mild residual L mouth droop  . H/O hiatal hernia 2008    surgery  . Gynecomastia, male   . Impotence of organic origin   . Rheumatoid arthritis(714.0) dx 2010    MTX, follows with rheum  . Gout   . Chronic back pain     follows with Nsurg  . Seasonal allergies   . Diverticulosis   . Gallstones   . Status post dilation of esophageal narrowing   . GERD (gastroesophageal reflux disease)   . Diverticulosis   . Esophagitis   . Hemorrhoids   . Tubular adenoma of colon   . Myocardial infarction ([place]) 2010     x 2  . Hypertension   . DVT, lower extremity, recurrent ([place]) 2008, 2009    LLE, chronic anticoag since 2009  . Carotid artery occlusion     40-60% right ICA stenosis (09/2008)  . Neuromuscular disorder (HCC)     neuropathy  . Fibromyalgia   . Hepatitis A yrs ago  . CLL (chronic lymphoblastic leukemia) dx 2010    Followed at [facility] q6mo, no current therapy   . Secondary syphilis 07/24/14 dx    s/p 2 wks doxy  . Type II diabetes mellitus  with manifestations ([place]) 04/02/2011  . Coronary artery disease 2010    s/p CABG '10, sees Dr. Percival Spanish  . HIV (human immunodeficiency virus infection) [facility])     Past Surgical History  Procedure Laterality Date  . Spine surgery  2010    "rod and screws", "failed", lopwer spine,   . Cholecystectomy    . Hiatal hernia repair      wrap  . Shoulder surgery Left   . Mandible surgery Bilateral     tmj  . Varicose vein      stripping  . Replacement total knee Bilateral   . Knee arthroplasty  07/22/2011    Procedure: COMPUTER ASSISTED TOTAL KNEE ARTHROPLASTY;  Surgeon: Meredith Pel, MD;   Location: [place];  Service: Orthopedics;  Laterality: Right;  Right total knee arthroplasty  . Coronary artery bypass graft  2010    triple bypass  . Hardware removal N/A 07/02/2012    Procedure: HARDWARE REMOVAL;  Surgeon: Elaina Hoops, MD;  Location: [place] [facility];  Service: Neurosurgery;  Laterality: N/A;  . Inguinal hernia repair Bilateral   . Umbilical hernia repair      x 1  . Rotator cuff repair Right   . Esophagogastroduodenoscopy (egd) with propofol N/A 12/28/2012    Procedure: ESOPHAGOGASTRODUODENOSCOPY (EGD) WITH PROPOFOL;  Surgeon: Jerene Bears, MD;  Location: [facility];  Service: Gastroenterology;  Laterality: N/A;  . Colonoscopy with propofol N/A 12/28/2012    Procedure: COLONOSCOPY WITH PROPOFOL;  Surgeon: Jerene Bears, MD;  Location: [facility];  Service: Gastroenterology;  Laterality: N/A;  . Joint replacement Left 1999  . Tonsillectomy    . Ring around testicle hernia reapir  184 and 1986    x 2  . Esophagogastroduodenoscopy (egd) with propofol N/A 03/15/2013    Procedure: ESOPHAGOGASTRODUODENOSCOPY (EGD) WITH PROPOFOL;  Surgeon: Jerene Bears, MD;  Location: [facility];  Service: Gastroenterology;  Laterality: N/A;    Family History  Problem Relation Age of Onset  . Breast cancer Mother   . Hypertension Mother   . Hyperlipidemia Mother   . Diabetes Mother   . Prostate cancer Father   . Colon polyps Father   . Hyperlipidemia Father   . Crohn's disease Paternal Aunt   . Diabetes Maternal Grandmother   . Diabetes Brother     x 3  . Heart disease Brother     x 3  . Hyperlipidemia Brother     x 3  . Colon cancer Neg Hx   . Alcohol abuse Daughter   . Drug abuse Daughter   . Asthma Brother    Social History:  reports that he has never smoked. He has never used smokeless tobacco. He reports that he drinks alcohol. He reports that he does not use illicit drugs.  Allergies:  Allergies  Allergen Reactions  . Morphine Other (See Comments)    REACTION: severe  headache  . Other Anaphylaxis and Hives    Pecan  . Peanut-Containing Drug Products Anaphylaxis and Hives    Swelling of throat  . Oxycodone-Acetaminophen Other (See Comments)    REACTION: headache  . Penicillins Rash    Has patient had a PCN reaction causing immediate rash, facial/tongue/throat swelling, SOB or lightheadedness with hypotension: Yes Has patient had a PCN reaction causing severe rash involving mucus membranes or skin necrosis: No Has patient had a PCN reaction that required hospitalization No Has patient had a PCN reaction occurring within the last 10 years: No If all of the  above answers are "NO", then may proceed with Cephalosporin use.   REACTION: red, flushed  . Promethazine Hcl Other (See Comments)    REACTION: makes him feel drunk    Medications Prior to Admission  Medication Sig Dispense Refill  . aspirin EC 81 MG tablet Take 81 mg by mouth daily.    [name] atorvastatin (LIPITOR) 10 MG tablet Take 1 tablet (10 mg total) by mouth at bedtime. (Patient taking differently: Take 10 mg by mouth every morning. ) 90 tablet 3  . elvitegravir-cobicistat-emtricitabine-tenofovir (GENVOYA) 150-150-200-10 MG TABS tablet Take 1 tablet by mouth daily with breakfast. 30 tablet 5  . escitalopram (LEXAPRO) 10 MG tablet Take 10 mg by mouth daily.    . fluconazole (DIFLUCAN) 100 MG tablet Take 1 tablet (100 mg total) by mouth daily. 7 tablet 0  . furosemide (LASIX) 20 MG tablet Take 1 tablet (20 mg total) by mouth 2 (two) times daily. (Patient taking differently: Take 20 mg by mouth every evening. ) 60 tablet 5  . inFLIXimab (REMICADE) 100 MG injection Inject into the vein every 5 (five) weeks.     [name] loratadine (CLARITIN) 10 MG tablet Take 1 tablet (10 mg total) by mouth daily. 90 tablet 3  . LYRICA 200 MG capsule TAKE 1 CAPSULE BY MOUTH TWICE DAILY 60 capsule 3  . metoprolol succinate (TOPROL-XL) 25 MG 24 hr tablet Take 1 tablet (25 mg total) by mouth at bedtime. 30 tablet 11  .  pantoprazole (PROTONIX) 40 MG tablet TAKE 1 TABLET EVERY DAY 30 tablet 2  . PREZISTA 800 MG tablet TAKE 1 TABLET (800 MG TOTAL) BY MOUTH DAILY. 30 tablet 5  . saccharomyces boulardii (FLORASTOR) 250 MG capsule Take 1 capsule (250 mg total) by mouth 2 (two) times daily. 60 capsule 1  . sulfamethoxazole-trimethoprim (BACTRIM DS,SEPTRA DS) 800-160 MG tablet Take 1 tablet by mouth daily. 30 tablet 3  . valACYclovir (VALTREX) 500 MG tablet TAKE 1 TABLET BY MOUTH EVERY DAY 90 tablet 3  . azaTHIOprine (IMURAN) 50 MG tablet Take 50 mg by mouth 2 (two) times daily.  3  . edoxaban (SAVAYSA) 30 MG TABS tablet Take 1 tablet (30 mg total) by mouth daily. 30 tablet 3  . EPINEPHrine (EPIPEN 2-PAK) 0.3 mg/0.3 mL IJ SOAJ injection Inject 0.3 mLs (0.3 mg total) into the muscle once. 2 Device 0  . prednisoLONE acetate (PRED FORTE) 1 % ophthalmic suspension Place 1 drop into the left eye 4 (four) times daily.  1  . urea (CARMOL) 10 % cream APPLY TOPICALLY AS NEEDED. 85 g 0  . zolpidem (AMBIEN) 5 MG tablet TAKE 1 TABLET BY MOUTH AT BEDTIME AS NEEDED FOR SLEEP 30 tablet 2    No results found for this or any previous visit (from the past 48 hour(s)). No results found.  ROS  Per phpi  Blood pressure 106/62, temperature 97.6 F (36.4 C), temperature source Oral, resp. rate 15, SpO2 97 %. Physical Exam - personally examined. This is NOT ELINK as computer says  BP 127/65 mmHg  Temp(Src) 97.6 F (36.4 C) (Oral)  Resp 16  SpO2 100%  General Appearance:    Alert, cooperative, no distress, appears stated age  Head:    Normocephalic, without obvious abnormality, atraumatic  Eyes:    PERRL, conjunctiva/corneas clear, EOM's intact, fundi    benign, both eyes       Ears:    Normal TM's and external ear canals, both ears  Nose:   Nares normal, septum midline,  mucosa normal, no drainage   or sinus tenderness  Throat:   Lips, mucosa, and tongue normal; teeth and gums normal  Neck:   Supple, symmetrical, trachea  midline, no adenopathy;       thyroid:  No enlargement/tenderness/nodules; no carotid   bruit or JVD  Back:    kyphotic  Lungs:     Clear to auscultation bilaterally, respirations unlabored  Chest wall:    No tenderness or deformity  Heart:    Regular rate and rhythm, S1 and S2 normal, no murmur, rub   or gallop  Abdomen:     Soft, non-tender, bowel sounds active all four quadrants,    no masses, no organomegaly  Genitalia:  Not done  Rectal:   not done  Extremities:   Extremities normal, atraumatic, no cyanosis or edema  Pulses:   2+ and symmetric all extremities  Skin:   Skin color, texture, turgor normal, no rashes or lesions  Lymph nodes:   Cervical, supraclavicular, and axillary nodes normal  Neurologic:   CNII-XII intact. Normal strength, sensation and reflexes      throughout   Patient Active Problem List   Diagnosis Date Noted  . ILD (interstitial lung disease) ([place]) 09/13/2015    Priority: High  . Immunocompromised state ([place]) 09/13/2015    Priority: Medium  . HIV disease ([place]) 04/08/2011    Priority: Medium  . Rheumatoid arthritis ([place]) 04/08/2011    Priority: Medium  . Diaper candidiasis 09/12/2015  . Pressure ulcer 07/11/2015  . PCP (pneumocystis jiroveci pneumonia) ([place]) 06/18/2015  . Mediastinal adenopathy 05/12/2015  . Postinflammatory pulmonary fibrosis ([place]) 05/11/2015  . Interstitial lung disease ([place]) 04/27/2015  . Secondary syphilis of skin 07/24/2014  . Insomnia 11/11/2013  . GERD (gastroesophageal reflux disease)   . Hereditary and idiopathic peripheral neuropathy 09/08/2013  . Erosive esophagitis 12/28/2012  . Colon polyp 12/28/2012  . Depression 11/16/2012  . Allergic rhinitis, cause unspecified 04/28/2012  . Long term current use of anticoagulant therapy 02/03/2012  . Hypertension   . DVT, lower extremity, recurrent ([place])   . Osteoarthritis, knee   . TIA (transient ischemic attack)   . Gout   . Chronic back pain   . Gynecomastia, male  11/19/2011  . Carotid artery occlusion   . Hyperlipidemia with target LDL less than 100   . Chronic lymphoblastic leukemia ([place]) 04/08/2011  . Type II diabetes mellitus with manifestations ([place]) 04/02/2011  . Impotence of organic origin 04/02/2011  . CAD (coronary artery disease) of artery bypass graft 02/14/2009      Assessment/Plan ILD wuith GGO multiole possibilities. BRonch BAL indicated.    PLAN  Risks of pneumothorax, hemothorax, sedation/anesthesia complications such as cardiac or respiratory arrest or hypotension, stroke and bleeding all explained. Benefits of diagnosis but limitations of non-diagnosis also explained. Patient verbalized understanding and wished to proceed.     Dr. Brand Males, M.D., [facility].C.P Pulmonary and Critical Care Medicine Staff Physician [place] [facility] Pager: [phone], If no answer or between  15:00h - 7:00h: call [phone]  10/16/2015 7:44 AM

## 2015-10-17 LAB — PNEUMOCYSTIS JIROVECI SMEAR BY DFA: Pneumocystis jiroveci Ag: NEGATIVE

## 2015-10-17 NOTE — Telephone Encounter (Signed)
appt scheduled.  Nothing further needed.  

## 2015-10-18 ENCOUNTER — Encounter (HOSPITAL_COMMUNITY): Payer: Self-pay | Admitting: Internal Medicine

## 2015-10-18 LAB — CULTURE, BAL-QUANTITATIVE W GRAM STAIN

## 2015-10-18 LAB — CULTURE, BAL-QUANTITATIVE
Culture: 40000 — AB
Gram Stain: NONE SEEN

## 2015-10-19 ENCOUNTER — Telehealth: Payer: Self-pay | Admitting: Internal Medicine

## 2015-10-19 LAB — ACID FAST SMEAR (AFB, MYCOBACTERIA): Acid Fast Smear: NEGATIVE

## 2015-10-19 NOTE — Telephone Encounter (Signed)
[name]  You are seeing him for fu. He has ILD and RA and HIV. REcent PCP in spring 2017. Hs most recent CT showed GGO was better. I bronched him 10/16/15 and so far negative but BAL is lympocythicI think (a bit). But CT was better. My rec if all BAL is otherwise negative is to repeat HRCT in 3 months from your visit and see me. If still has ILD or getting worse then refer surgical bx

## 2015-10-20 ENCOUNTER — Other Ambulatory Visit: Payer: Self-pay | Admitting: Internal Medicine

## 2015-10-22 ENCOUNTER — Other Ambulatory Visit: Payer: Self-pay | Admitting: Internal Medicine

## 2015-10-22 ENCOUNTER — Encounter: Payer: Self-pay | Admitting: Internal Medicine

## 2015-10-22 ENCOUNTER — Other Ambulatory Visit: Payer: Self-pay | Admitting: *Deleted

## 2015-10-22 DIAGNOSIS — B20 Human immunodeficiency virus [HIV] disease: Secondary | ICD-10-CM

## 2015-10-22 LAB — MTB NAA WITHOUT AFB CULTURE: M Tuberculosis, Naa: NEGATIVE

## 2015-10-22 MED ORDER — ELVITEG-COBIC-EMTRICIT-TENOFAF 150-150-200-10 MG PO TABS: 1.0000 | ORAL_TABLET | Freq: Every day | ORAL | Status: DC

## 2015-10-22 MED ORDER — TRIAMCINOLONE ACETONIDE 0.5 % EX CREA: 1.0000 "application " | TOPICAL_CREAM | Freq: Three times a day (TID) | CUTANEOUS | Status: DC

## 2015-10-22 MED ORDER — DARUNAVIR ETHANOLATE 800 MG PO TABS: ORAL_TABLET | ORAL | Status: DC

## 2015-10-24 LAB — LEGIONELLA PNEUMOPHILA/CULTURE: Legionella Pneumophila DFA: NEGATIVE

## 2015-10-24 LAB — LEGIONELLA CULTURE REFLEX

## 2015-10-25 ENCOUNTER — Encounter: Payer: Self-pay | Admitting: Adult Health

## 2015-10-25 ENCOUNTER — Ambulatory Visit (INDEPENDENT_AMBULATORY_CARE_PROVIDER_SITE_OTHER): Payer: Medicare Other | Admitting: Adult Health

## 2015-10-25 VITALS — BP 116/62 | HR 69 | Temp 98.3°F | Ht 65.0 in | Wt 200.0 lb

## 2015-10-25 DIAGNOSIS — J849 Interstitial pulmonary disease, unspecified: Secondary | ICD-10-CM | POA: Diagnosis not present

## 2015-10-25 DIAGNOSIS — K219 Gastro-esophageal reflux disease without esophagitis: Secondary | ICD-10-CM | POA: Diagnosis not present

## 2015-10-25 NOTE — Patient Instructions (Addendum)
Set up for HRCT chest in 3 months  Follow up Dr. Chase Caller in 3 month after CT is done. With PFT .  GERD diet  Refer to GI for GERD and dysphagia .

## 2015-10-25 NOTE — Assessment & Plan Note (Signed)
Presumed RA-ILD -curently stable  Most recent CT chest with decreased GGO .  FOB with cytology showing benign reactive changes.  Cx to date are neg.  Discussed results with pt and Dr. Chase Caller -will have pt return in 3 month with PFT and HRCT  If worsen will consider refer for surgical bx.

## 2015-10-25 NOTE — Addendum Note (Signed)
Addended by: Osa Craver on: 10/25/2015 04:34 PM   Modules accepted: Orders

## 2015-10-25 NOTE — Assessment & Plan Note (Signed)
Worsening GERD and dysphagia  GERD diet  Cont PPI  Set up with GI.

## 2015-10-25 NOTE — Progress Notes (Signed)
Subjective:    Patient ID: [name], male    DOB: 10/11/1950, 65 y.o.   MRN: [id number]  HPI 65 yo never smoker with RA followed by [name]  Has HIV and CLL .   10/25/2015 Follow up: ILD  Patient returns for a 6 week follow-up for ILD. Patient was recently seen by Dr. Chase Caller for worsening shortness of breath since December 2016. He was noticed to have ILD changes on CT chest. Diagnosis with interstitial lung disease related to rheumatoid arthritis was made. He does have HIV with CD4 counts normal all the way back since 2010. He was started on Imuran at that time. Patient was admitted earlier this year with pneumonia and readmitted in February 2017 for acute hypoxic respiratory failure. He chest in February 2017 showed progressive patchy opacities in both lungs. Repeat CT chest 09/27/2015 showed a significant decrease in groundglass attenuation bilaterally. Fibrotic changes are considered stable since February 2017 but increased since January 2015 Patient returns today after undergoing a bronchoscopy on 10/16/2015. Ultracet been negative to date. Cytology showed benign reactive changes. Patient says overall he is feeling okay. He continues to use oxygen with activity and at bedtime. He denies any flare cough or dyspnea.  Has been having more reflux . Has intermittent dysphagia .  Chokes easily . Previous hx of esophageal surgery .    Past Medical History  Diagnosis Date  . Osteoarthritis, knee     s/p B TKA  . HIV infection ([place]) dx 1993  . TIA (transient ischemic attack) 1997    mild residual L mouth droop  . H/O hiatal hernia 2008    surgery  . Gynecomastia, male   . Impotence of organic origin   . Rheumatoid arthritis(714.0) dx 2010    MTX, follows with rheum  . Gout   . Chronic back pain     follows with Nsurg  . Seasonal allergies   . Diverticulosis   . Gallstones   . Status post dilation of esophageal narrowing   . GERD (gastroesophageal reflux disease)   .  Diverticulosis   . Esophagitis   . Hemorrhoids   . Tubular adenoma of colon   . Myocardial infarction ([place]) 2010     x 2  . Hypertension   . DVT, lower extremity, recurrent ([place]) 2008, 2009    LLE, chronic anticoag since 2009  . Carotid artery occlusion     40-60% right ICA stenosis (09/2008)  . Neuromuscular disorder (HCC)     neuropathy  . Fibromyalgia   . Hepatitis A yrs ago  . CLL (chronic lymphoblastic leukemia) dx 2010    Followed at [facility] q68mo, no current therapy   . Secondary syphilis 07/24/14 dx    s/p 2 wks doxy  . Type II diabetes mellitus with manifestations ([place]) 04/02/2011  . Coronary artery disease 2010    s/p CABG '10, sees Dr. Percival Spanish  . HIV (human immunodeficiency virus infection) ([place])    Current Outpatient Prescriptions on File Prior to Visit  Medication Sig Dispense Refill  . aspirin EC 81 MG tablet Take 81 mg by mouth daily.    [name] atorvastatin (LIPITOR) 10 MG tablet Take 1 tablet (10 mg total) by mouth at bedtime. (Patient taking differently: Take 10 mg by mouth every morning. ) 90 tablet 3  . azaTHIOprine (IMURAN) 50 MG tablet Take 50 mg by mouth 2 (two) times daily.  3  . Darunavir Ethanolate (PREZISTA) 800 MG tablet TAKE 1 TABLET (800 MG  TOTAL) BY MOUTH DAILY. 30 tablet 5  . edoxaban (SAVAYSA) 30 MG TABS tablet Take 1 tablet (30 mg total) by mouth daily. 30 tablet 3  . elvitegravir-cobicistat-emtricitabine-tenofovir (GENVOYA) 150-150-200-10 MG TABS tablet Take 1 tablet by mouth daily with breakfast. 30 tablet 5  . EPINEPHrine (EPIPEN 2-PAK) 0.3 mg/0.3 mL IJ SOAJ injection Inject 0.3 mLs (0.3 mg total) into the muscle once. 2 Device 0  . furosemide (LASIX) 20 MG tablet Take 1 tablet (20 mg total) by mouth 2 (two) times daily. (Patient taking differently: Take 20 mg by mouth every evening. ) 60 tablet 5  . inFLIXimab (REMICADE) 100 MG injection Inject into the vein every 5 (five) weeks.     [name] loratadine (CLARITIN) 10 MG tablet Take 1 tablet (10 mg total) by  mouth daily. 90 tablet 3  . LYRICA 200 MG capsule TAKE 1 CAPSULE BY MOUTH TWICE DAILY 60 capsule 3  . metoprolol succinate (TOPROL-XL) 25 MG 24 hr tablet Take 1 tablet (25 mg total) by mouth at bedtime. 30 tablet 11  . pantoprazole (PROTONIX) 40 MG tablet TAKE 1 TABLET EVERY DAY 30 tablet 2  . saccharomyces boulardii (FLORASTOR) 250 MG capsule Take 1 capsule (250 mg total) by mouth 2 (two) times daily. 60 capsule 1  . sulfamethoxazole-trimethoprim (BACTRIM DS,SEPTRA DS) 800-160 MG tablet Take 1 tablet by mouth daily. 30 tablet 3  . triamcinolone cream (KENALOG) 0.5 % Apply 1 application topically 3 (three) times daily. 100 g 2  . urea (CARMOL) 10 % cream APPLY TOPICALLY AS NEEDED. 85 g 0  . valACYclovir (VALTREX) 500 MG tablet TAKE 1 TABLET BY MOUTH EVERY DAY 90 tablet 3  . zolpidem (AMBIEN) 5 MG tablet TAKE 1 TABLET BY MOUTH AT BEDTIME AS NEEDED FOR SLEEP 30 tablet 2  . escitalopram (LEXAPRO) 10 MG tablet Take 10 mg by mouth daily. Reported on 10/25/2015    . prednisoLONE acetate (PRED FORTE) 1 % ophthalmic suspension Place 1 drop into the left eye 4 (four) times daily. Reported on 10/25/2015  1   No current facility-administered medications on file prior to visit.     Review of Systems Constitutional:   No  weight loss, night sweats,  Fevers, chills,  +fatigue, or  lassitude.  HEENT:   No headaches,  Difficulty swallowing,  Tooth/dental problems, or  Sore throat,                No sneezing, itching, ear ache, nasal congestion, post nasal drip,   CV:  No chest pain,  Orthopnea, PND, swelling in lower extremities, anasarca, dizziness, palpitations, syncope.   GI  + GERD  No pain, nausea, vomiting, diarrhea, change in bowel habits, loss of appetite, bloody stools.   Resp:  No chest wall deformity  Skin: no rash or lesions.  GU: no dysuria, change in color of urine, no urgency or frequency.  No flank pain, no hematuria   MS:  No joint pain or swelling.  No decreased range of motion.  No  back pain.  Psych:  No change in mood or affect. No depression or anxiety.  No memory loss.         Objective:   Physical Exam . Filed Vitals:   10/25/15 1532  BP: 116/62  Pulse: 69  Temp: 98.3 F (36.8 C)  TempSrc: Oral  Height: 5\' 5"  (1.651 m)  Weight: 200 lb (90.719 kg)  SpO2: 97%   GEN: A/Ox3; pleasant , NAD, elderly   HEENT:  [place]/AT,  EACs-clear,  TMs-wnl, NOSE-clear, THROAT-clear, no lesions, no postnasal drip or exudate noted.   NECK:  Supple w/ fair ROM; no JVD; normal carotid impulses w/o bruits; no thyromegaly or nodules palpated; no lymphadenopathy.  RESP  BB Crackles, no accessory muscle use, no dullness to percussion  CARD:  RRR, no m/r/g  , tr  peripheral edema, pulses intact, no cyanosis or clubbing.  GI:   Soft & nt; nml bowel sounds; no organomegaly or masses detected.  [place]: Warm bil, no deformities arthritic changes of hands noted. , walks with cane.   Neuro: alert, no focal deficits noted.    Skin: Warm, no lesions or rashes  Gudrun Axe NP-C  [facility]   10/25/2015       Assessment & Plan:

## 2015-11-06 DIAGNOSIS — Z7982 Long term (current) use of aspirin: Secondary | ICD-10-CM | POA: Diagnosis not present

## 2015-11-06 DIAGNOSIS — Z880 Allergy status to penicillin: Secondary | ICD-10-CM | POA: Diagnosis not present

## 2015-11-06 DIAGNOSIS — K5730 Diverticulosis of large intestine without perforation or abscess without bleeding: Secondary | ICD-10-CM | POA: Diagnosis not present

## 2015-11-06 DIAGNOSIS — R509 Fever, unspecified: Secondary | ICD-10-CM | POA: Diagnosis not present

## 2015-11-06 DIAGNOSIS — D61818 Other pancytopenia: Secondary | ICD-10-CM | POA: Diagnosis not present

## 2015-11-06 DIAGNOSIS — Z888 Allergy status to other drugs, medicaments and biological substances status: Secondary | ICD-10-CM | POA: Diagnosis not present

## 2015-11-06 DIAGNOSIS — I340 Nonrheumatic mitral (valve) insufficiency: Secondary | ICD-10-CM | POA: Diagnosis not present

## 2015-11-06 DIAGNOSIS — Z96653 Presence of artificial knee joint, bilateral: Secondary | ICD-10-CM | POA: Diagnosis present

## 2015-11-06 DIAGNOSIS — G629 Polyneuropathy, unspecified: Secondary | ICD-10-CM | POA: Diagnosis present

## 2015-11-06 DIAGNOSIS — E876 Hypokalemia: Secondary | ICD-10-CM | POA: Diagnosis present

## 2015-11-06 DIAGNOSIS — Z886 Allergy status to analgesic agent status: Secondary | ICD-10-CM | POA: Diagnosis not present

## 2015-11-06 DIAGNOSIS — I959 Hypotension, unspecified: Secondary | ICD-10-CM | POA: Diagnosis not present

## 2015-11-06 DIAGNOSIS — I517 Cardiomegaly: Secondary | ICD-10-CM | POA: Diagnosis not present

## 2015-11-06 DIAGNOSIS — J9811 Atelectasis: Secondary | ICD-10-CM | POA: Diagnosis not present

## 2015-11-06 DIAGNOSIS — Z955 Presence of coronary angioplasty implant and graft: Secondary | ICD-10-CM | POA: Diagnosis not present

## 2015-11-06 DIAGNOSIS — I252 Old myocardial infarction: Secondary | ICD-10-CM | POA: Diagnosis not present

## 2015-11-06 DIAGNOSIS — Z79899 Other long term (current) drug therapy: Secondary | ICD-10-CM | POA: Diagnosis not present

## 2015-11-06 DIAGNOSIS — N179 Acute kidney failure, unspecified: Secondary | ICD-10-CM | POA: Diagnosis not present

## 2015-11-06 DIAGNOSIS — I5033 Acute on chronic diastolic (congestive) heart failure: Secondary | ICD-10-CM | POA: Diagnosis not present

## 2015-11-06 DIAGNOSIS — R55 Syncope and collapse: Secondary | ICD-10-CM | POA: Diagnosis not present

## 2015-11-06 DIAGNOSIS — Z951 Presence of aortocoronary bypass graft: Secondary | ICD-10-CM | POA: Diagnosis not present

## 2015-11-06 DIAGNOSIS — M069 Rheumatoid arthritis, unspecified: Secondary | ICD-10-CM | POA: Diagnosis present

## 2015-11-06 DIAGNOSIS — K219 Gastro-esophageal reflux disease without esophagitis: Secondary | ICD-10-CM | POA: Diagnosis not present

## 2015-11-06 DIAGNOSIS — A419 Sepsis, unspecified organism: Secondary | ICD-10-CM | POA: Diagnosis not present

## 2015-11-06 DIAGNOSIS — D696 Thrombocytopenia, unspecified: Secondary | ICD-10-CM | POA: Diagnosis present

## 2015-11-06 DIAGNOSIS — S299XXA Unspecified injury of thorax, initial encounter: Secondary | ICD-10-CM | POA: Diagnosis not present

## 2015-11-06 DIAGNOSIS — Z21 Asymptomatic human immunodeficiency virus [HIV] infection status: Secondary | ICD-10-CM | POA: Diagnosis not present

## 2015-11-06 DIAGNOSIS — I2510 Atherosclerotic heart disease of native coronary artery without angina pectoris: Secondary | ICD-10-CM | POA: Diagnosis present

## 2015-11-12 ENCOUNTER — Other Ambulatory Visit: Payer: Self-pay | Admitting: Internal Medicine

## 2015-11-22 ENCOUNTER — Other Ambulatory Visit: Payer: Self-pay | Admitting: Internal Medicine

## 2015-11-23 DIAGNOSIS — M0609 Rheumatoid arthritis without rheumatoid factor, multiple sites: Secondary | ICD-10-CM | POA: Diagnosis not present

## 2015-11-29 ENCOUNTER — Ambulatory Visit (HOSPITAL_BASED_OUTPATIENT_CLINIC_OR_DEPARTMENT_OTHER): Payer: Medicare Other | Admitting: Oncology

## 2015-11-29 ENCOUNTER — Ambulatory Visit: Payer: Self-pay | Admitting: Oncology

## 2015-11-29 ENCOUNTER — Other Ambulatory Visit (HOSPITAL_BASED_OUTPATIENT_CLINIC_OR_DEPARTMENT_OTHER): Payer: Medicare Other

## 2015-11-29 ENCOUNTER — Other Ambulatory Visit: Payer: Self-pay

## 2015-11-29 ENCOUNTER — Telehealth: Payer: Self-pay | Admitting: Oncology

## 2015-11-29 VITALS — BP 121/71 | HR 73 | Temp 97.6°F | Resp 18 | Ht 65.0 in | Wt 187.4 lb

## 2015-11-29 DIAGNOSIS — IMO0001 Reserved for inherently not codable concepts without codable children: Secondary | ICD-10-CM

## 2015-11-29 DIAGNOSIS — C9110 Chronic lymphocytic leukemia of B-cell type not having achieved remission: Secondary | ICD-10-CM | POA: Diagnosis not present

## 2015-11-29 LAB — COMPREHENSIVE METABOLIC PANEL
ALT: 30 U/L (ref 0–55)
AST: 25 U/L (ref 5–34)
Albumin: 3.6 g/dL (ref 3.5–5.0)
Alkaline Phosphatase: 68 U/L (ref 40–150)
Anion Gap: 7 mEq/L (ref 3–11)
BUN: 23 mg/dL (ref 7.0–26.0)
CO2: 30 mEq/L — ABNORMAL HIGH (ref 22–29)
Calcium: 8.4 mg/dL (ref 8.4–10.4)
Chloride: 104 mEq/L (ref 98–109)
Creatinine: 1.1 mg/dL (ref 0.7–1.3)
EGFR: 71 mL/min/{1.73_m2} — ABNORMAL LOW (ref >90)
Glucose: 88 mg/dl (ref 70–140)
Potassium: 3.9 mEq/L (ref 3.5–5.1)
Sodium: 141 mEq/L (ref 136–145)
Total Bilirubin: 0.55 mg/dL (ref 0.20–1.20)
Total Protein: 5.8 g/dL — ABNORMAL LOW (ref 6.4–8.3)

## 2015-11-29 LAB — CBC WITH DIFFERENTIAL/PLATELET
BASO%: 0.1 % (ref 0.0–2.0)
Basophils Absolute: 0 10*3/uL (ref 0.0–0.1)
EOS%: 1 % (ref 0.0–7.0)
Eosinophils Absolute: 0.1 10*3/uL (ref 0.0–0.5)
HCT: 40.6 % (ref 38.4–49.9)
HGB: 13.2 g/dL (ref 13.0–17.1)
LYMPH%: 76.5 % — ABNORMAL HIGH (ref 14.0–49.0)
MCH: 29.7 pg (ref 27.2–33.4)
MCHC: 32.5 g/dL (ref 32.0–36.0)
MCV: 91.2 fL (ref 79.3–98.0)
MONO#: 1 10*3/uL — ABNORMAL HIGH (ref 0.1–0.9)
MONO%: 6.9 % (ref 0.0–14.0)
NEUT#: 2.2 10*3/uL (ref 1.5–6.5)
NEUT%: 15.5 % — ABNORMAL LOW (ref 39.0–75.0)
Platelets: 130 10*3/uL — ABNORMAL LOW (ref 140–400)
RBC: 4.45 10*6/uL (ref 4.20–5.82)
RDW: 16.6 % — ABNORMAL HIGH (ref 11.0–14.6)
WBC: 14.3 10*3/uL — ABNORMAL HIGH (ref 4.0–10.3)
lymph#: 11 10*3/uL — ABNORMAL HIGH (ref 0.9–3.3)

## 2015-11-29 LAB — TECHNOLOGIST REVIEW

## 2015-11-29 NOTE — Telephone Encounter (Signed)
Gave pt cal & avs °

## 2015-11-29 NOTE — Progress Notes (Signed)
Hematology and Oncology Follow Up Visit  [name] [id number] [birth year] 65 y.o. 11/29/2015 9:17 AM [name], MDJones, Christopher Right, MD   Principle Diagnosis: 65 year old gentleman with CLL diagnosed in 2007 at [facility].  Current therapy: Observation and surveillance.  Interim History:  [name] presents today for a followup visit.  Since his last visit, he underwent a bronchoscopy for follow-up for interstitial lung disease and tolerated it well. Has no major changes or abnormalities noted. He is otherwise at baseline functioning at this time. He still able to eat, ambulate and drink properly. He is intentionally losing weight by cutting down on certain foods and exercising regularly. He denied any constitutional symptoms of fevers or chills or sweats. He denied any adenopathy or constitutional symptoms.   He has not reported any fevers or chills or sweats. Has not reported any cough or hemoptysis or hematemesis. Has not reported any weight loss her early satiety. He does not report any blurry vision or double vision. Does not report any syncope or seizures. Report any chest pain or shortness of breath. Does not report any cough or hemoptysis. His appetite and weight have been stable. Has not reported any change in his bowel habits such as constipation or diarrhea. He is not reporting any urinary symptoms. Rest of his review of systems unremarkable.  Medications: I have reviewed the patient's current medications.  Current Outpatient Prescriptions  Medication Sig Dispense Refill  . aspirin EC 81 MG tablet Take 81 mg by mouth daily.    [name] atorvastatin (LIPITOR) 10 MG tablet Take 1 tablet (10 mg total) by mouth at bedtime. (Patient taking differently: Take 10 mg by mouth every morning. ) 90 tablet 3  . azaTHIOprine (IMURAN) 50 MG tablet Take 50 mg by mouth 2 (two) times daily.  3  . Darunavir Ethanolate (PREZISTA) 800 MG tablet TAKE 1 TABLET (800 MG TOTAL) BY MOUTH DAILY.  30 tablet 5  . edoxaban (SAVAYSA) 30 MG TABS tablet Take 1 tablet (30 mg total) by mouth daily. 30 tablet 3  . elvitegravir-cobicistat-emtricitabine-tenofovir (GENVOYA) 150-150-200-10 MG TABS tablet Take 1 tablet by mouth daily with breakfast. 30 tablet 5  . EPINEPHrine (EPIPEN 2-PAK) 0.3 mg/0.3 mL IJ SOAJ injection Inject 0.3 mLs (0.3 mg total) into the muscle once. 2 Device 0  . escitalopram (LEXAPRO) 10 MG tablet Take 10 mg by mouth daily. Reported on 10/25/2015    . furosemide (LASIX) 20 MG tablet TAKE 1 TABLET (20 MG TOTAL) BY MOUTH 2 (TWO) TIMES DAILY. 60 tablet 5  . inFLIXimab (REMICADE) 100 MG injection Inject into the vein every 5 (five) weeks.     [name] loratadine (CLARITIN) 10 MG tablet Take 1 tablet (10 mg total) by mouth daily. 90 tablet 3  . LYRICA 200 MG capsule TAKE 1 CAPSULE BY MOUTH TWICE DAILY 60 capsule 3  . metoprolol succinate (TOPROL-XL) 25 MG 24 hr tablet Take 1 tablet (25 mg total) by mouth at bedtime. 30 tablet 11  . pantoprazole (PROTONIX) 40 MG tablet TAKE 1 TABLET EVERY DAY 30 tablet 0  . prednisoLONE acetate (PRED FORTE) 1 % ophthalmic suspension Place 1 drop into the left eye 4 (four) times daily. Reported on 10/25/2015  1  . saccharomyces boulardii (FLORASTOR) 250 MG capsule Take 1 capsule (250 mg total) by mouth 2 (two) times daily. 60 capsule 1  . sulfamethoxazole-trimethoprim (BACTRIM DS,SEPTRA DS) 800-160 MG tablet Take 1 tablet by mouth daily. 30 tablet 3  . triamcinolone cream (KENALOG) 0.5 %  Apply 1 application topically 3 (three) times daily. 100 g 2  . urea (CARMOL) 10 % cream APPLY TOPICALLY AS NEEDED. 85 g 0  . valACYclovir (VALTREX) 500 MG tablet TAKE 1 TABLET BY MOUTH EVERY DAY 90 tablet 3  . zolpidem (AMBIEN) 5 MG tablet TAKE 1 TABLET BY MOUTH AT BEDTIME AS NEEDED FOR SLEEP 30 tablet 2   No current facility-administered medications for this visit.     Allergies:  Allergies  Allergen Reactions  . Morphine Other (See Comments)    REACTION: severe  headache  . Other Anaphylaxis and Hives    Pecan  . Peanut-Containing Drug Products Anaphylaxis and Hives    Swelling of throat  . Oxycodone-Acetaminophen Other (See Comments)    REACTION: headache  . Penicillins Rash    Has patient had a PCN reaction causing immediate rash, facial/tongue/throat swelling, SOB or lightheadedness with hypotension: Yes Has patient had a PCN reaction causing severe rash involving mucus membranes or skin necrosis: No Has patient had a PCN reaction that required hospitalization No Has patient had a PCN reaction occurring within the last 10 years: No If all of the above answers are "NO", then may proceed with Cephalosporin use.   REACTION: red, flushed  . Promethazine Hcl Other (See Comments)    REACTION: makes him feel drunk    Past Medical History, Surgical history, Social history, and Family History were reviewed and updated.   Physical Exam: Blood pressure 121/71, pulse 73, temperature 97.6 F (36.4 C), temperature source Oral, resp. rate 18, height 5\' 5"  (1.651 m), weight 187 lb 6.4 oz (85.004 kg), SpO2 99 %. ECOG: 1 General appearance: Pleasant-appearing gentleman without distress. Head: Normocephalic, without obvious abnormality no oral thrush noted. Neck: no adenopathy Lymph nodes: Cervical, supraclavicular, and axillary nodes normal. Heart:regular rate and rhythm, S1, S2 normal, no murmur, click, rub or gallop Lung:chest clear, no wheezing, rales, normal symmetric air entry. No dullness to percussion. Abdomin: soft, non-tender, without masses or organomegaly. No shifting dullness or ascites. EXT:no erythema, induration, or nodules   Lab Results: Lab Results  Component Value Date   WBC 14.3* 11/29/2015   HGB 13.2 11/29/2015   HCT 40.6 11/29/2015   MCV 91.2 11/29/2015   PLT 130* 11/29/2015     Chemistry      Component Value Date/Time   NA 140 09/06/2015 1130   NA 140 05/31/2015 0821   K 4.5 09/06/2015 1130   K 3.5 05/31/2015 0821    CL 105 09/06/2015 1130   CO2 25 09/06/2015 1130   CO2 30* 05/31/2015 0821   BUN 21 09/06/2015 1130   BUN 25.6 05/31/2015 0821   CREATININE 1.08 09/06/2015 1130   CREATININE 0.74 07/13/2015 0719   CREATININE 0.8 05/31/2015 0821      Component Value Date/Time   CALCIUM 8.8 09/06/2015 1130   CALCIUM 8.0* 05/31/2015 0821   ALKPHOS 84 08/30/2015 1538   ALKPHOS 84 05/31/2015 0821   AST 38* 08/30/2015 1538   AST 17 05/31/2015 0821   ALT 29 08/30/2015 1538   ALT 27 05/31/2015 0821   BILITOT 0.7 08/30/2015 1538   BILITOT 1.04 05/31/2015 0821        Impression and Plan:  65 year old gentleman with the following issues:  1. CLL diagnosed in 2007 presented with stage 0 CD38 positive, ZAP 70 positive. He did have some mild lymphadenopathy on CT scan in January 2015 that is mostly subcentimeter with a aortocaval lymph node largest measuring 2.0 x 1.6 cm.  Laboratory data from today reviewed and his white cell count remains relatively stable with a total white cell count of 14,000 and lymphocyte percentage around 75%. His physical examination today did not reveal any evidence to suggest recurrent disease. The plan is to continue with active surveillance with clinical visit and laboratory testing in 6 months.  2. Arthralgias, myalgias and headaches: These symptoms have resolved at this time.   3. Follow-up: We'll be in 6 months sooner if needed to.  [facility], MD 7/20/20179:17 AM

## 2015-12-01 LAB — ACID FAST CULTURE WITH REFLEXED SENSITIVITIES (MYCOBACTERIA): Acid Fast Culture: NEGATIVE

## 2015-12-03 ENCOUNTER — Encounter: Payer: Self-pay | Admitting: *Deleted

## 2015-12-03 ENCOUNTER — Encounter: Payer: Self-pay | Admitting: Infectious Diseases

## 2015-12-06 DIAGNOSIS — M0589 Other rheumatoid arthritis with rheumatoid factor of multiple sites: Secondary | ICD-10-CM | POA: Diagnosis not present

## 2015-12-06 DIAGNOSIS — M150 Primary generalized (osteo)arthritis: Secondary | ICD-10-CM | POA: Diagnosis not present

## 2015-12-06 DIAGNOSIS — B59 Pneumocystosis: Secondary | ICD-10-CM | POA: Diagnosis not present

## 2015-12-06 DIAGNOSIS — J849 Interstitial pulmonary disease, unspecified: Secondary | ICD-10-CM | POA: Diagnosis not present

## 2015-12-06 DIAGNOSIS — M0609 Rheumatoid arthritis without rheumatoid factor, multiple sites: Secondary | ICD-10-CM | POA: Diagnosis not present

## 2015-12-06 DIAGNOSIS — M25552 Pain in left hip: Secondary | ICD-10-CM | POA: Diagnosis not present

## 2015-12-06 DIAGNOSIS — Z21 Asymptomatic human immunodeficiency virus [HIV] infection status: Secondary | ICD-10-CM | POA: Diagnosis not present

## 2015-12-06 DIAGNOSIS — M7989 Other specified soft tissue disorders: Secondary | ICD-10-CM | POA: Diagnosis not present

## 2015-12-06 DIAGNOSIS — C9110 Chronic lymphocytic leukemia of B-cell type not having achieved remission: Secondary | ICD-10-CM | POA: Diagnosis not present

## 2015-12-06 DIAGNOSIS — M1A09X0 Idiopathic chronic gout, multiple sites, without tophus (tophi): Secondary | ICD-10-CM | POA: Diagnosis not present

## 2015-12-12 ENCOUNTER — Other Ambulatory Visit: Payer: Self-pay | Admitting: Internal Medicine

## 2015-12-12 ENCOUNTER — Other Ambulatory Visit: Payer: Self-pay | Admitting: Cardiology

## 2015-12-12 ENCOUNTER — Other Ambulatory Visit: Payer: Self-pay | Admitting: Infectious Diseases

## 2015-12-12 DIAGNOSIS — M792 Neuralgia and neuritis, unspecified: Secondary | ICD-10-CM

## 2015-12-12 DIAGNOSIS — B20 Human immunodeficiency virus [HIV] disease: Secondary | ICD-10-CM

## 2015-12-12 DIAGNOSIS — B59 Pneumocystosis: Secondary | ICD-10-CM

## 2015-12-12 DIAGNOSIS — I82409 Acute embolism and thrombosis of unspecified deep veins of unspecified lower extremity: Secondary | ICD-10-CM

## 2015-12-12 NOTE — Telephone Encounter (Signed)
Rx(s) sent to pharmacy electronically.  

## 2015-12-18 ENCOUNTER — Encounter: Payer: Self-pay | Admitting: Internal Medicine

## 2015-12-18 ENCOUNTER — Ambulatory Visit (INDEPENDENT_AMBULATORY_CARE_PROVIDER_SITE_OTHER): Payer: Medicare Other | Admitting: Internal Medicine

## 2015-12-18 VITALS — BP 128/80 | HR 68 | Temp 98.0°F | Resp 16 | Ht 65.0 in | Wt 186.2 lb

## 2015-12-18 DIAGNOSIS — A5139 Other secondary syphilis of skin: Secondary | ICD-10-CM

## 2015-12-18 DIAGNOSIS — L84 Corns and callosities: Secondary | ICD-10-CM

## 2015-12-18 DIAGNOSIS — M25552 Pain in left hip: Secondary | ICD-10-CM | POA: Diagnosis not present

## 2015-12-18 DIAGNOSIS — B372 Candidiasis of skin and nail: Secondary | ICD-10-CM

## 2015-12-18 DIAGNOSIS — I10 Essential (primary) hypertension: Secondary | ICD-10-CM

## 2015-12-18 DIAGNOSIS — M792 Neuralgia and neuritis, unspecified: Secondary | ICD-10-CM

## 2015-12-18 DIAGNOSIS — L22 Diaper dermatitis: Secondary | ICD-10-CM

## 2015-12-18 DIAGNOSIS — R739 Hyperglycemia, unspecified: Secondary | ICD-10-CM

## 2015-12-18 DIAGNOSIS — G609 Hereditary and idiopathic neuropathy, unspecified: Secondary | ICD-10-CM

## 2015-12-18 LAB — POCT GLYCOSYLATED HEMOGLOBIN (HGB A1C): Hemoglobin A1C: 5.2

## 2015-12-18 MED ORDER — FLUCONAZOLE 100 MG PO TABS: 100.0000 mg | ORAL_TABLET | Freq: Every day | ORAL | 2 refills | Status: DC

## 2015-12-18 NOTE — Progress Notes (Addendum)
Subjective:  Patient ID: [name], male    DOB: Nov 15, 1950  Age: 65 y.o. MRN: [id number]  CC: Rash   HPI [name] presents for follow-up on rash in his left groin. He saw another physician and was placed on Diflucan and has had a significant improvement. He is concerned the rash may return so he is asking for refill on Diflucan. He feels like he is in his baseline state of health and offers no new or different complaints today.  Outpatient Medications Prior to Visit  Medication Sig Dispense Refill  . aspirin EC 81 MG tablet Take 81 mg by mouth daily.    [name] atorvastatin (LIPITOR) 10 MG tablet TAKE 1 TABLET (10 MG TOTAL) BY MOUTH AT BEDTIME. 90 tablet 2  . azaTHIOprine (IMURAN) 50 MG tablet Take 50 mg by mouth 2 (two) times daily.  3  . EPINEPHrine (EPIPEN 2-PAK) 0.3 mg/0.3 mL IJ SOAJ injection Inject 0.3 mLs (0.3 mg total) into the muscle once. 2 Device 0  . escitalopram (LEXAPRO) 10 MG tablet Take 10 mg by mouth daily. Reported on 10/25/2015    . furosemide (LASIX) 20 MG tablet TAKE 1 TABLET (20 MG TOTAL) BY MOUTH 2 (TWO) TIMES DAILY. 60 tablet 5  . GENVOYA 150-150-200-10 MG TABS tablet TAKE 1 TABLET BY MOUTH DAILY WITH BREAKFAST. 30 tablet 5  . inFLIXimab (REMICADE) 100 MG injection Inject into the vein every 5 (five) weeks.     [name] loratadine (CLARITIN) 10 MG tablet Take 1 tablet (10 mg total) by mouth daily. 90 tablet 3  . LYRICA 200 MG capsule TAKE 1 CAPSULE BY MOUTH TWICE DAILY 60 capsule 1  . metoprolol succinate (TOPROL-XL) 25 MG 24 hr tablet Take 1 tablet (25 mg total) by mouth at bedtime. 30 tablet 11  . pantoprazole (PROTONIX) 40 MG tablet TAKE 1 TABLET EVERY DAY 30 tablet 0  . prednisoLONE acetate (PRED FORTE) 1 % ophthalmic suspension Place 1 drop into the left eye 4 (four) times daily. Reported on 10/25/2015  1  . PREZISTA 800 MG tablet TAKE 1 TABLET (800 MG TOTAL) BY MOUTH DAILY. 30 tablet 5  . saccharomyces boulardii (FLORASTOR) 250 MG capsule Take 1 capsule (250 mg  total) by mouth 2 (two) times daily. 60 capsule 1  . SAVAYSA 30 MG TABS tablet TAKE 1 TABLET (30 MG TOTAL) BY MOUTH DAILY. 30 tablet 3  . sulfamethoxazole-trimethoprim (BACTRIM DS,SEPTRA DS) 800-160 MG tablet TAKE 1 TABLET BY MOUTH DAILY. 30 tablet 3  . triamcinolone cream (KENALOG) 0.5 % Apply 1 application topically 3 (three) times daily. 100 g 2  . urea (CARMOL) 10 % cream APPLY TOPICALLY AS NEEDED. 85 g 0  . valACYclovir (VALTREX) 500 MG tablet TAKE 1 TABLET BY MOUTH EVERY DAY 90 tablet 3  . zolpidem (AMBIEN) 5 MG tablet TAKE 1 TABLET BY MOUTH AT BEDTIME AS NEEDED FOR SLEEP 30 tablet 2   No facility-administered medications prior to visit.     ROS Review of Systems  Constitutional: Negative.  Negative for appetite change, chills, fatigue and fever.  HENT: Negative.  Negative for trouble swallowing.   Eyes: Negative.  Negative for visual disturbance.  Respiratory: Positive for shortness of breath. Negative for cough, chest tightness and wheezing.   Cardiovascular: Negative for chest pain, palpitations and leg swelling.  Gastrointestinal: Negative.  Negative for abdominal pain, constipation, diarrhea, nausea and vomiting.  Genitourinary: Negative.   Musculoskeletal: Negative.  Negative for back pain, myalgias and neck pain.  Skin:  Positive for rash.  Allergic/Immunologic: Negative.   Neurological: Negative.  Negative for dizziness and weakness.  Hematological: Negative.  Negative for adenopathy. Does not bruise/bleed easily.    Objective:  BP 128/80 (BP Location: Left Arm, Patient Position: Sitting, Cuff Size: Normal)   Pulse 68   Temp 98 F (36.7 C) (Oral)   Resp 16   Ht 5\' 5"  (1.651 m)   Wt 186 lb 4 oz (84.5 kg)   SpO2 94%   BMI 30.99 kg/m   BP Readings from Last 3 Encounters:  12/18/15 128/80  11/29/15 121/71  10/25/15 116/62    Wt Readings from Last 3 Encounters:  12/18/15 186 lb 4 oz (84.5 kg)  11/29/15 187 lb 6.4 oz (85 kg)  10/25/15 200 lb (90.7 kg)     Physical Exam  Constitutional: He is oriented to person, place, and time. No distress.  HENT:  Mouth/Throat: Oropharynx is clear and moist. No oropharyngeal exudate.  Eyes: Conjunctivae are normal. Right eye exhibits no discharge. Left eye exhibits no discharge. No scleral icterus.  Neck: Normal range of motion. Neck supple. No JVD present. No tracheal deviation present. No thyromegaly present.  Cardiovascular: Normal rate, regular rhythm, normal heart sounds and intact distal pulses.  Exam reveals no gallop and no friction rub.   No murmur heard. Pulmonary/Chest: Effort normal and breath sounds normal. No stridor. No respiratory distress. He has no wheezes. He has no rales. He exhibits no tenderness.  Abdominal: Soft. Bowel sounds are normal. He exhibits no distension and no mass. There is no tenderness. There is no rebound and no guarding.  Musculoskeletal: Normal range of motion. He exhibits no edema, tenderness or deformity.  Patient is also here for a DM shoe order. Patient has a history of pre-ulcerative callus, peripheral neuropathy with evidence of callus formation, foot deformity and poor circulation. Foot exam form has been completed per Medicare guidelines.      Lymphadenopathy:    He has no cervical adenopathy.  Neurological: He is oriented to person, place, and time.  Skin: Skin is warm and dry. Rash noted. No purpura noted. Rash is macular and papular. Rash is not nodular, not vesicular and not urticarial. He is not diaphoretic. There is erythema. No pallor.     Vitals reviewed.   Lab Results  Component Value Date   WBC 14.3 (H) 11/29/2015   HGB 13.2 11/29/2015   HCT 40.6 11/29/2015   PLT 130 (L) 11/29/2015   GLUCOSE 88 11/29/2015   CHOL 149 08/30/2015   TRIG 151 (H) 08/30/2015   HDL 29 (L) 08/30/2015   LDLCALC 90 08/30/2015   ALT 30 11/29/2015   AST 25 11/29/2015   NA 141 11/29/2015   K 3.9 11/29/2015   CL 105 09/06/2015   CREATININE 1.1 11/29/2015   BUN  23.0 11/29/2015   CO2 30 (H) 11/29/2015   TSH 0.60 07/11/2014   INR 2.92 (H) 07/13/2015   HGBA1C 5.2 12/18/2015    No results found.  Assessment & Plan:   Makbel was seen today for rash.  Diagnoses and all orders for this visit:  Secondary syphilis of skin- I will recheck his RPR to be certain that there has not been a recurrence of syphilis -     RPR; Future  Essential hypertension- his blood pressure is adequately well-controlled  Diaper candidiasis- he is on multiple immunosuppressants and continues to take Bactrim for pneumocystis infection so will continue Diflucan as needed to treat and prevent a recurrence of candidiasis. -  fluconazole (DIFLUCAN) 100 MG tablet; Take 1 tablet (100 mg total) by mouth daily.   I am having Mr. Calzadilla start on fluconazole. I am also having him maintain his aspirin EC, inFLIXimab, EPINEPHrine, metoprolol succinate, saccharomyces boulardii, loratadine, valACYclovir, zolpidem, azaTHIOprine, escitalopram, urea, prednisoLONE acetate, triamcinolone cream, furosemide, pantoprazole, atorvastatin, PREZISTA, SAVAYSA, LYRICA, sulfamethoxazole-trimethoprim, and GENVOYA.  Meds ordered this encounter  Medications  . fluconazole (DIFLUCAN) 100 MG tablet    Sig: Take 1 tablet (100 mg total) by mouth daily.    Dispense:  10 tablet    Refill:  2     Follow-up: Return in about 6 months (around 06/19/2016).  [name], MD

## 2015-12-18 NOTE — Patient Instructions (Signed)
Hypertension Hypertension, commonly called high blood pressure, is when the force of blood pumping through your arteries is too strong. Your arteries are the blood vessels that carry blood from your heart throughout your body. A blood pressure reading consists of a higher number over a lower number, such as 110/72. The higher number (systolic) is the pressure inside your arteries when your heart pumps. The lower number (diastolic) is the pressure inside your arteries when your heart relaxes. Ideally you want your blood pressure below 120/80. Hypertension forces your heart to work harder to pump blood. Your arteries may become narrow or stiff. Having untreated or uncontrolled hypertension can cause heart attack, stroke, kidney disease, and other problems. RISK FACTORS Some risk factors for high blood pressure are controllable. Others are not.  Risk factors you cannot control include:   Race. You may be at higher risk if you are African American.  Age. Risk increases with age.  Gender. Men are at higher risk than women before age 45 years. After age 65, women are at higher risk than men. Risk factors you can control include:  Not getting enough exercise or physical activity.  Being overweight.  Getting too much fat, sugar, calories, or salt in your diet.  Drinking too much alcohol. SIGNS AND SYMPTOMS Hypertension does not usually cause signs or symptoms. Extremely high blood pressure (hypertensive crisis) may cause headache, anxiety, shortness of breath, and nosebleed. DIAGNOSIS To check if you have hypertension, your health care provider will measure your blood pressure while you are seated, with your arm held at the level of your heart. It should be measured at least twice using the same arm. Certain conditions can cause a difference in blood pressure between your right and left arms. A blood pressure reading that is higher than normal on one occasion does not mean that you need treatment. If  it is not clear whether you have high blood pressure, you may be asked to return on a different day to have your blood pressure checked again. Or, you may be asked to monitor your blood pressure at home for 1 or more weeks. TREATMENT Treating high blood pressure includes making lifestyle changes and possibly taking medicine. Living a healthy lifestyle can help lower high blood pressure. You may need to change some of your habits. Lifestyle changes may include:  Following the DASH diet. This diet is high in fruits, vegetables, and whole grains. It is low in salt, red meat, and added sugars.  Keep your sodium intake below 2,300 mg per day.  Getting at least 30-45 minutes of aerobic exercise at least 4 times per week.  Losing weight if necessary.  Not smoking.  Limiting alcoholic beverages.  Learning ways to reduce stress. Your health care provider may prescribe medicine if lifestyle changes are not enough to get your blood pressure under control, and if one of the following is true:  You are 18-59 years of age and your systolic blood pressure is above 140.  You are 60 years of age or older, and your systolic blood pressure is above 150.  Your diastolic blood pressure is above 90.  You have diabetes, and your systolic blood pressure is over 140 or your diastolic blood pressure is over 90.  You have kidney disease and your blood pressure is above 140/90.  You have heart disease and your blood pressure is above 140/90. Your personal target blood pressure may vary depending on your medical conditions, your age, and other factors. HOME CARE INSTRUCTIONS    Have your blood pressure rechecked as directed by your health care provider.   Take medicines only as directed by your health care provider. Follow the directions carefully. Blood pressure medicines must be taken as prescribed. The medicine does not work as well when you skip doses. Skipping doses also puts you at risk for  problems.  Do not smoke.   Monitor your blood pressure at home as directed by your health care provider. SEEK MEDICAL CARE IF:   You think you are having a reaction to medicines taken.  You have recurrent headaches or feel dizzy.  You have swelling in your ankles.  You have trouble with your vision. SEEK IMMEDIATE MEDICAL CARE IF:  You develop a severe headache or confusion.  You have unusual weakness, numbness, or feel faint.  You have severe chest or abdominal pain.  You vomit repeatedly.  You have trouble breathing. MAKE SURE YOU:   Understand these instructions.  Will watch your condition.  Will get help right away if you are not doing well or get worse.   This information is not intended to replace advice given to you by your health care provider. Make sure you discuss any questions you have with your health care provider.   Document Released: 04/28/2005 Document Revised: 09/12/2014 Document Reviewed: 02/18/2013 Elsevier Interactive Patient Education 2016 Elsevier Inc.  

## 2015-12-18 NOTE — Progress Notes (Addendum)
Pre visit review using our clinic review tool, if applicable. No additional management support is needed unless otherwise documented below in the visit note. 

## 2015-12-19 DIAGNOSIS — I509 Heart failure, unspecified: Secondary | ICD-10-CM | POA: Diagnosis not present

## 2015-12-19 DIAGNOSIS — S61213A Laceration without foreign body of left middle finger without damage to nail, initial encounter: Secondary | ICD-10-CM | POA: Diagnosis not present

## 2015-12-19 DIAGNOSIS — W293XXA Contact with powered garden and outdoor hand tools and machinery, initial encounter: Secondary | ICD-10-CM | POA: Diagnosis not present

## 2015-12-19 DIAGNOSIS — Z21 Asymptomatic human immunodeficiency virus [HIV] infection status: Secondary | ICD-10-CM | POA: Diagnosis not present

## 2015-12-19 DIAGNOSIS — S6992XA Unspecified injury of left wrist, hand and finger(s), initial encounter: Secondary | ICD-10-CM | POA: Diagnosis not present

## 2015-12-19 DIAGNOSIS — Z7982 Long term (current) use of aspirin: Secondary | ICD-10-CM | POA: Diagnosis not present

## 2015-12-19 DIAGNOSIS — Z79899 Other long term (current) drug therapy: Secondary | ICD-10-CM | POA: Diagnosis not present

## 2015-12-20 NOTE — Addendum Note (Signed)
Addended by: Aviva Signs M on: 12/20/2015 01:50 PM   Modules accepted: Orders

## 2015-12-21 ENCOUNTER — Telehealth: Payer: Self-pay

## 2015-12-21 ENCOUNTER — Encounter: Payer: Self-pay | Admitting: *Deleted

## 2015-12-21 ENCOUNTER — Other Ambulatory Visit: Payer: Self-pay | Admitting: Internal Medicine

## 2015-12-21 DIAGNOSIS — Z7982 Long term (current) use of aspirin: Secondary | ICD-10-CM | POA: Diagnosis not present

## 2015-12-21 DIAGNOSIS — I509 Heart failure, unspecified: Secondary | ICD-10-CM | POA: Diagnosis not present

## 2015-12-21 DIAGNOSIS — S61213D Laceration without foreign body of left middle finger without damage to nail, subsequent encounter: Secondary | ICD-10-CM | POA: Diagnosis not present

## 2015-12-21 DIAGNOSIS — Z21 Asymptomatic human immunodeficiency virus [HIV] infection status: Secondary | ICD-10-CM | POA: Diagnosis not present

## 2015-12-21 DIAGNOSIS — Z79899 Other long term (current) drug therapy: Secondary | ICD-10-CM | POA: Diagnosis not present

## 2015-12-21 DIAGNOSIS — Z4800 Encounter for change or removal of nonsurgical wound dressing: Secondary | ICD-10-CM | POA: Diagnosis not present

## 2015-12-21 NOTE — Telephone Encounter (Signed)
[place] called and has some questions and something is not signed. She asked if you would call her back so she can go over this with you. Thanks.

## 2015-12-21 NOTE — Telephone Encounter (Signed)
Order faxed to [facility] and [facility] have been sent to scan.

## 2015-12-21 NOTE — Telephone Encounter (Signed)
[place] back. They need to bottom of the foot exam signed and notes added to OV with same dx listed on Physician Statement form.

## 2015-12-27 ENCOUNTER — Ambulatory Visit: Payer: Self-pay | Admitting: Internal Medicine

## 2015-12-27 DIAGNOSIS — S61213D Laceration without foreign body of left middle finger without damage to nail, subsequent encounter: Secondary | ICD-10-CM | POA: Diagnosis not present

## 2015-12-27 DIAGNOSIS — Z4802 Encounter for removal of sutures: Secondary | ICD-10-CM | POA: Diagnosis not present

## 2015-12-28 ENCOUNTER — Encounter: Payer: Self-pay | Admitting: Internal Medicine

## 2015-12-28 ENCOUNTER — Telehealth: Payer: Self-pay | Admitting: Internal Medicine

## 2015-12-28 NOTE — Telephone Encounter (Signed)
Ok will see him midesept and eval

## 2015-12-28 NOTE — Telephone Encounter (Signed)
afb culture negative at 6 weeks but that was 2 mo ago. Will see you mid sept 2017 for clinical visit    Called and spoke with pt and he stated that he has appt on 9/14 for CT, PFT and appt with MR.    Pt stated that he was in the hospital for 8 days---about 1 month ago.  He stated that they did find fluid around his lungs and in his lungs.  He also possibly had RMSF.  He was at [facility] in [place], [place].

## 2015-12-28 NOTE — Progress Notes (Signed)
lmtcb for pt.  

## 2015-12-31 ENCOUNTER — Other Ambulatory Visit: Payer: Self-pay | Admitting: Internal Medicine

## 2015-12-31 DIAGNOSIS — B356 Tinea cruris: Secondary | ICD-10-CM

## 2015-12-31 MED ORDER — CICLOPIROX OLAMINE 0.77 % EX SUSP: 1.0000 | Freq: Two times a day (BID) | CUTANEOUS | 5 refills | Status: DC

## 2016-01-01 ENCOUNTER — Other Ambulatory Visit: Payer: Self-pay | Admitting: Internal Medicine

## 2016-01-01 DIAGNOSIS — B59 Pneumocystosis: Secondary | ICD-10-CM

## 2016-01-03 ENCOUNTER — Ambulatory Visit (INDEPENDENT_AMBULATORY_CARE_PROVIDER_SITE_OTHER): Payer: Medicare Other | Admitting: Internal Medicine

## 2016-01-03 ENCOUNTER — Telehealth: Payer: Self-pay | Admitting: *Deleted

## 2016-01-03 ENCOUNTER — Encounter: Payer: Self-pay | Admitting: Internal Medicine

## 2016-01-03 VITALS — BP 120/80 | HR 72 | Ht 69.0 in | Wt 187.4 lb

## 2016-01-03 DIAGNOSIS — M0609 Rheumatoid arthritis without rheumatoid factor, multiple sites: Secondary | ICD-10-CM | POA: Diagnosis not present

## 2016-01-03 DIAGNOSIS — K219 Gastro-esophageal reflux disease without esophagitis: Secondary | ICD-10-CM

## 2016-01-03 DIAGNOSIS — R1310 Dysphagia, unspecified: Secondary | ICD-10-CM | POA: Diagnosis not present

## 2016-01-03 DIAGNOSIS — Z7901 Long term (current) use of anticoagulants: Secondary | ICD-10-CM | POA: Diagnosis not present

## 2016-01-03 DIAGNOSIS — M25552 Pain in left hip: Secondary | ICD-10-CM | POA: Diagnosis not present

## 2016-01-03 MED ORDER — PANTOPRAZOLE SODIUM 40 MG PO TBEC: 40.0000 mg | DELAYED_RELEASE_TABLET | Freq: Two times a day (BID) | ORAL | 3 refills | Status: DC

## 2016-01-03 NOTE — Telephone Encounter (Signed)
01/03/2016  RE: [name] DOB: 29-Nov-1950 MRN: [id number]  Dear Dr [name],   We have scheduled the above patient for an endoscopic procedure. Our records show that he is on anticoagulation therapy.   Please advise whether the patient may come off his therapy of Savaysa 48 hours prior to the procedure, which is scheduled for 02/07/16.  Please route your response to Dixon Boos, CMA  Sincerely,  Dixon Boos

## 2016-01-03 NOTE — Progress Notes (Signed)
Subjective:    Patient ID: [name], male    DOB: 1950-07-26, 65 y.o.   MRN: [id number]  HPI Zain Lawry is a 65 year old male with history of GERD, erosive esophagitis, adenomatous colon polyps, HIV, interstitial lung disease, rheumatoid arthritis, CLL who is seen in follow-up to evaluate dysphagia. He is here alone today.  He reports having had a very eventful year with his lungs. He's been hospitalized 3 times for pneumonia. This is felt secondary to infection versus ILD versus reaction to azathioprine. Azathioprine has been stopped and he is maintained on Remicade for his rheumatoid arthritis. He states he is meeting with [name], his rheumatologist later today with plans to change biologic therapy to a new "injection" medication. Currently from a shortness of breast and point things have improved. He is off of antibiotics but is using oxygen at night.  He reports that in the last several months he has noticed food getting "caught" in the lower chest. He feels like food is moving slowly and his stomach. He also has a burning in his throat and a mild cough. This "does not exactly feel like traditional heartburn". He does continue pantoprazole 40 mg daily. At times after he eats when he stands he will fill food regurgitate into his throat. He had an esophageal dilation in 2014 with a balloon across the GE junction to 18 mm. He recalls this being "extremely helpful". He denies abdominal pain. Bowel habits have gone back to normal without blood in his stool or melena.  He is taking Savaysa for anticoagulation. He believes Dr. Ronnald Ramp monitors and makes decisions regarding this medication.  He has been dealing with a groin rash and has been treated with fluconazole initially for 10 days then every 3 days but now on another course of 10 days of therapy plus a cream.  Review of Systems As per HPI, otherwise negative  Current Medications, Allergies, Past Medical History, Past Surgical  History, Family History and Social History were reviewed in [employer] record.     Objective:   Physical Exam BP 120/80   Pulse 72   Ht 5\' 9"  (1.753 m)   Wt 187 lb 6 oz (85 kg)   BMI 27.67 kg/m  Constitutional: Well-developed and Chronically ill-appearing male in no acute distress . HEENT: Normocephalic and atraumatic. Oropharynx is clear and moist. No oropharyngeal exudate. Conjunctivae are normal.  No scleral icterus. Neck: Neck supple. Trachea midline. Cardiovascular: Normal rate, regular rhythm and intact distal pulses.  Pulmonary/chest: Effort normal and breath sounds normal. No wheezing, rales or rhonchi. Abdominal: Soft, nontender, nondistended. Bowel sounds active throughout.  Extremities: no clubbing, cyanosis, or edema, changes typical for rheumatoid arthritis bilateral hands Neurological: Alert and oriented to person place and time. Skin: Skin is warm and dry with bronze color. Psychiatric: Normal mood and affect. Behavior is normal.  CBC    Component Value Date/Time   WBC 14.3 (H) 11/29/2015 0857   WBC 17.1 (H) 08/30/2015 1538   RBC 4.45 11/29/2015 0857   RBC 4.20 08/30/2015 1538   HGB 13.2 11/29/2015 0857   HCT 40.6 11/29/2015 0857   PLT 130 (L) 11/29/2015 0857   MCV 91.2 11/29/2015 0857   MCH 29.7 11/29/2015 0857   MCH 30.0 08/30/2015 1538   MCHC 32.5 11/29/2015 0857   MCHC 32.2 08/30/2015 1538   RDW 16.6 (H) 11/29/2015 0857   LYMPHSABS 11.0 (H) 11/29/2015 0857   MONOABS 1.0 (H) 11/29/2015 0857   EOSABS 0.1 11/29/2015  0857   BASOSABS 0.0 11/29/2015 0857   CMP     Component Value Date/Time   NA 141 11/29/2015 0857   K 3.9 11/29/2015 0857   CL 105 09/06/2015 1130   CO2 30 (H) 11/29/2015 0857   GLUCOSE 88 11/29/2015 0857   BUN 23.0 11/29/2015 0857   CREATININE 1.1 11/29/2015 0857   CALCIUM 8.4 11/29/2015 0857   PROT 5.8 (L) 11/29/2015 0857   ALBUMIN 3.6 11/29/2015 0857   AST 25 11/29/2015 0857   ALT 30 11/29/2015 0857    ALKPHOS 68 11/29/2015 0857   BILITOT 0.55 11/29/2015 0857   GFRNONAA >60 07/13/2015 0719   GFRNONAA 85 11/20/2014 1527   GFRAA >60 07/13/2015 0719   GFRAA >89 11/20/2014 1527   Last CD4 230       Assessment & Plan:  65 year old male with history of GERD, erosive esophagitis, adenomatous colon polyps, HIV, interstitial lung disease, rheumatoid arthritis, CLL who is seen in follow-up to evaluate dysphagia.   1. GERD/esophageal dysphagia -- symptoms could be secondary to reflux or infectious esophagitis. Candida esophagitis felt unlikely given recent fluconazole therapy. Given his dip in CD4 count which occurred earlier this year around his respiratory illnesses, he is at risk for viral esophagitis as well. Esophageal dysmotility also could be a component. I recommended increasing pantoprazole to 40 mg twice a day before meals in case he is having reflux esophagitis. I've also recommended a barium esophagram with tablet to evaluate esophageal motility. We will follow this with upper endoscopy and possible dilation as before given his positive response. This will need to be performed in the outpatient hospital setting due to his nocturnal oxygen use. --Will hold Savaysa 2 days prior to endoscopic procedures - will instruct when and how to resume after procedure. Benefits and risks of procedure explained including risks of bleeding, perforation, infection, missed lesions, reactions to medications and possible need for hospitalization and surgery for complications. Additional rare but real risk of stroke or other vascular clotting events off Savaysa also explained and need to seek urgent help if any signs of these problems occur. Will communicate by phone or EMR with patient's  prescribing provider to confirm that holding Savaysa is reasonable in this case.   2. Rheumatoid arthritis -- follows closely with [name], he is anticipating a change in biologic therapy very soon  3. HIV -- following  closely with ID and on antiviral therapy  25 minutes spent with the patient today. Greater than 50% was spent in counseling and coordination of care with the patient

## 2016-01-03 NOTE — Telephone Encounter (Signed)
Yes, he can hold savaysa for 2 days prior to the procedure

## 2016-01-03 NOTE — Patient Instructions (Signed)
We have sent the following medications to your pharmacy for you to pick up at your convenience: Pantoprazole 40 mg twice daily before meals  You have been scheduled for an endoscopy. Please follow written instructions given to you at your visit today. If you use inhalers (even only as needed), please bring them with you on the day of your procedure. Your physician has requested that you go to www.startemmi.com and enter the access code given to you at your visit today. This web site gives a general overview about your procedure. However, you should still follow specific instructions given to you by our office regarding your preparation for the procedure.  We will ask Dr Ronnald Ramp about holding your Savaysa for procedure.  You have been scheduled for a Barium Esophogram at [facility] (1st floor of the hospital) on 01/08/16 at 11:30 am. Please arrive 15 minutes prior to your appointment for registration. Make certain not to have anything to eat or drink 6 hours prior to your test. If you need to reschedule for any reason, please contact radiology at [phone] to do so. __________________________________________________________________ A barium swallow is an examination that concentrates on views of the esophagus. This tends to be a double contrast exam (barium and two liquids which, when combined, create a gas to distend the wall of the oesophagus) or single contrast (non-ionic iodine based). The study is usually tailored to your symptoms so a good history is essential. Attention is paid during the study to the form, structure and configuration of the esophagus, looking for functional disorders (such as aspiration, dysphagia, achalasia, motility and reflux) EXAMINATION You may be asked to change into a gown, depending on the type of swallow being performed. A radiologist and radiographer will perform the procedure. The radiologist will advise you of the type of contrast selected for your  procedure and direct you during the exam. You will be asked to stand, sit or lie in several different positions and to hold a small amount of fluid in your mouth before being asked to swallow while the imaging is performed .In some instances you may be asked to swallow barium coated marshmallows to assess the motility of a solid food bolus. The exam can be recorded as a digital or video fluoroscopy procedure. POST PROCEDURE It will take 1-2 days for the barium to pass through your system. To facilitate this, it is important, unless otherwise directed, to increase your fluids for the next 24-48hrs and to resume your normal diet.  This test typically takes about 30 minutes to perform. __________________________________________________________________________________ If you are age 67 or older, your body mass index should be between 23-30. Your Body mass index is 27.67 kg/m. If this is out of the aforementioned range listed, please consider follow up with your Primary Care Provider.  If you are age 10 or younger, your body mass index should be between 19-25. Your Body mass index is 27.67 kg/m. If this is out of the aformentioned range listed, please consider follow up with your Primary Care Provider.

## 2016-01-03 NOTE — Telephone Encounter (Signed)
Patient advised that per Dr [name], he may hold Savaysa 2 days prior to his test. Patient verbalizes understanding.

## 2016-01-08 ENCOUNTER — Telehealth: Payer: Self-pay | Admitting: Internal Medicine

## 2016-01-08 ENCOUNTER — Ambulatory Visit (HOSPITAL_COMMUNITY)
Admission: RE | Admit: 2016-01-08 | Discharge: 2016-01-08 | Disposition: A | Discharge status: Home / Self Care | Payer: Medicare Other | Source: Ambulatory Visit | Attending: Internal Medicine | Admitting: Internal Medicine

## 2016-01-08 DIAGNOSIS — K224 Dyskinesia of esophagus: Secondary | ICD-10-CM | POA: Diagnosis not present

## 2016-01-08 DIAGNOSIS — K449 Diaphragmatic hernia without obstruction or gangrene: Secondary | ICD-10-CM | POA: Insufficient documentation

## 2016-01-08 DIAGNOSIS — R1310 Dysphagia, unspecified: Secondary | ICD-10-CM | POA: Insufficient documentation

## 2016-01-16 ENCOUNTER — Other Ambulatory Visit: Payer: Self-pay | Admitting: Infectious Diseases

## 2016-01-16 DIAGNOSIS — M792 Neuralgia and neuritis, unspecified: Secondary | ICD-10-CM

## 2016-01-17 DIAGNOSIS — M0609 Rheumatoid arthritis without rheumatoid factor, multiple sites: Secondary | ICD-10-CM | POA: Diagnosis not present

## 2016-01-18 ENCOUNTER — Other Ambulatory Visit: Payer: Self-pay | Admitting: *Deleted

## 2016-01-18 MED ORDER — METOPROLOL SUCCINATE ER 25 MG PO TB24: 25.0000 mg | ORAL_TABLET | Freq: Every day | ORAL | 0 refills | Status: DC

## 2016-01-18 NOTE — Telephone Encounter (Signed)
Rx(s) sent to pharmacy electronically.  

## 2016-01-24 ENCOUNTER — Encounter: Payer: Self-pay | Admitting: Internal Medicine

## 2016-01-24 ENCOUNTER — Ambulatory Visit (INDEPENDENT_AMBULATORY_CARE_PROVIDER_SITE_OTHER): Payer: Medicare Other | Admitting: Internal Medicine

## 2016-01-24 ENCOUNTER — Ambulatory Visit (INDEPENDENT_AMBULATORY_CARE_PROVIDER_SITE_OTHER)
Admission: RE | Admit: 2016-01-24 | Discharge: 2016-01-24 | Disposition: A | Discharge status: Home / Self Care | Payer: Medicare Other | Source: Ambulatory Visit | Attending: Adult Health | Admitting: Adult Health

## 2016-01-24 ENCOUNTER — Encounter (INDEPENDENT_AMBULATORY_CARE_PROVIDER_SITE_OTHER): Payer: Medicare Other | Admitting: Internal Medicine

## 2016-01-24 VITALS — BP 112/66 | HR 73 | Ht 65.0 in | Wt 190.0 lb

## 2016-01-24 DIAGNOSIS — J849 Interstitial pulmonary disease, unspecified: Secondary | ICD-10-CM

## 2016-01-24 DIAGNOSIS — Z23 Encounter for immunization: Secondary | ICD-10-CM | POA: Diagnosis not present

## 2016-01-24 DIAGNOSIS — D899 Disorder involving the immune mechanism, unspecified: Secondary | ICD-10-CM

## 2016-01-24 DIAGNOSIS — D849 Immunodeficiency, unspecified: Secondary | ICD-10-CM

## 2016-01-24 DIAGNOSIS — R0602 Shortness of breath: Secondary | ICD-10-CM | POA: Diagnosis not present

## 2016-01-24 LAB — PULMONARY FUNCTION TEST
DL/VA % pred: 77 %
DL/VA: 3.31 ml/min/mmHg/L
DLCO unc % pred: 65 %
DLCO unc: 16.88 ml/min/mmHg
FEF 25-75 Post: 3.1 L/sec
FEF 25-75 Pre: 2.26 L/sec
FEF2575-%Change-Post: 37 %
FEF2575-%Pred-Post: 144 %
FEF2575-%Pred-Pre: 105 %
FEV1-%Change-Post: 8 %
FEV1-%Pred-Post: 106 %
FEV1-%Pred-Pre: 98 %
FEV1-Post: 2.91 L
FEV1-Pre: 2.68 L
FEV1FVC-%Change-Post: 6 %
FEV1FVC-%Pred-Pre: 104 %
FEV6-%Change-Post: 1 %
FEV6-%Pred-Post: 100 %
FEV6-%Pred-Pre: 98 %
FEV6-Post: 3.51 L
FEV6-Pre: 3.44 L
FEV6FVC-%Change-Post: 0 %
FEV6FVC-%Pred-Post: 106 %
FEV6FVC-%Pred-Pre: 105 %
FVC-%Change-Post: 1 %
FVC-%Pred-Post: 95 %
FVC-%Pred-Pre: 93 %
FVC-Post: 3.52 L
FVC-Pre: 3.46 L
Post FEV1/FVC ratio: 83 %
Post FEV6/FVC ratio: 100 %
Pre FEV1/FVC ratio: 78 %
Pre FEV6/FVC Ratio: 100 %
RV % pred: 103 %
RV: 2.2 L
TLC % pred: 94 %
TLC: 5.7 L

## 2016-01-24 NOTE — Progress Notes (Signed)
Subjective:     Patient ID: [name], male   DOB: 04-17-1951, 65 y.o.   MRN: [id number]  HPI 65 yo never smoker with RA followed by [name]  Has HIV and CLL .    Admit date: 06/18/2015 Discharge date: 06/20/2015  Discharge Diagnoses:  Principal Problem:  Community acquired pneumonia Active Problems:  CAD (coronary artery disease) of artery bypass graft  Type II diabetes mellitus with manifestations (HCC)  HIV disease ([place])  Chronic lymphoblastic leukemia ([employer])  Hypertension  DVT, lower extremity, recurrent ([place])  Pneumonia  Dyspnea   06/29/2015  f/u ov/Wert re: NSIP presumably due to RA lung dz/ poor control of arthritis chronically / s/p ? CAP  Chief Complaint  Patient presents with  . Follow-up    PFT done today. Pt hospitalized from 06/18/15-06/20/15 with PNA. He feels his breathing has improved since d/c. Cough is prod wit minimal, thick, dark yellow sputum.    mucous now has turned clear p finished abx /min volume in ams  / activity tol back near baseline s resp meds   No obvious day to day or daytime variability or assoc cp or chest tightness, subjective wheeze or overt sinus or hb symptoms. No unusual exp hx or h/o childhood pna/ asthma or knowledge of premature birth.  Sleeping ok without nocturnal  or early am exacerbation  of respiratory  c/o's or need for noct saba. Also denies any obvious fluctuation of symptoms with weather or environmental changes or other aggravating or alleviating factors except as outlined above     OV 09/13/2015  Chief Complaint  Patient presents with  . Follow-up    Pt changing providers from MW to MR for pulmonary fibrosis. Pt c/o DOE. Pt denies cough, CP/tightness.   Is a transfer of care from Dr. Legrand Como wert to Dr. Fuller Plan  65 year old male immunosuppressed on basis of HIV and rheumatoid arthritis. Given necrosis factor Alpha blockade and Imuran. He appears to have CT findings of interstitial lung disease  starting in September 2016 and progressing all the way through end of February 2017. A 2015 CT chest did not show any interstitial lung disease  He reports that December 2016 he reported to pulmonary office for worsening shortness of breath. Noticed to have interstitial lung disease changes. Clinical diagnosis that interstitial lung disease was related to rheumatoid arthritis was made [his HIV viral load was low and CD4 counts were normal all the way since 2010]. This point in time he had been on infliximab for 3 years. A decision was made to add Imuran to the regimen. At this point in time he was not on Bactrim. Subsequently he got sick early every 2017 and was admitted to the hospital with a clinical diagnosis of community acquired pneumonia and treated with antibiotics and discharged. Subsequently readmitted end of February 2017 for 10 days with hypoxemic respiratory failure. Treated with broad-spectrum antibiotics but did not improve. Finally started on Bactrim for presumed Pneumocystis carinii pneumonia according to the patient and then began to improve. This was when he had last CT scan of the chest which comparatively shows progressive ILD changes.  At this point in time he is on immunosuppressive medications. Most recently saw Dr. Riley Churches and infectious diseases his CD4 count has dropped for the first time and he believes this is due to stress. He is compliant with this anti-retroviral therapy Genovaya. He is also on anticoagulation. Overall he is feeling much better physically stronger and less short of breath.  Although he still does have shortness of breath with exertion infection in the office he did desaturate.    Walking desat test 185 feet x 3 laps on RA -> desat to 87% at 2nd lap and got dyspneic and wanted to stop  10/25/2015 Follow up: ILD  Patient returns for a 6 week follow-up for ILD. Patient was recently seen by Dr. Chase Caller for worsening shortness of breath since December  2016. He was noticed to have ILD changes on CT chest. Diagnosis with interstitial lung disease related to rheumatoid arthritis was made. He does have HIV with CD4 counts normal all the way back since 2010. He was started on Imuran at that time. Patient was admitted earlier this year with pneumonia and readmitted in February 2017 for acute hypoxic respiratory failure. He chest in February 2017 showed progressive patchy opacities in both lungs. Repeat CT chest 09/27/2015 showed a significant decrease in groundglass attenuation bilaterally. Fibrotic changes are considered stable since February 2017 but increased since January 2015 Patient returns today after undergoing a bronchoscopy on 10/16/2015. Ultracet been negative to date. Cytology showed benign reactive changes. Patient says overall he is feeling okay. He continues to use oxygen with activity and at bedtime. He denies any flare cough or dyspnea.  Has been having more reflux . Has intermittent dysphagia .  Chokes easily . Previous hx of esophageal surgery .    OV 01/24/2016  Chief Complaint  Patient presents with  . Follow-up    3 month ILD follow up & PFT review - reports breathing is at baseline.  was in [facility] July 2017 for PNA     follow-up interstitial lung disease in te setting of rheumatoid arthriti on Imuran and HIV disease with  Normal CD4 count nd antiretroviratherapy.   he is on HARRT and TNF alpha blockade treatment aocumented below. Overall snce last isit he is doing wll. Dyspnea is improved. In the interim approximately June 2017ot admitted at [facility] of "fluid in lungs" .  Now back tbaeline  PFT is normal but for low dlco that is moderate 65%. This is improved from early 2017 and similar to year ago. CT chest today just mild basal ILD - NSIP pattern - personally visuzalied  Ct Chest High Resolution  Result Date: 01/24/2016 CLINICAL DATA:  65 year old male with shortness of breath on exertion. History of  pneumonia in May 2017. History of rheumatoid arthritis. Evaluate for interstitial lung disease. EXAM: CT CHEST WITHOUT CONTRAST TECHNIQUE: Multidetector CT imaging of the chest was performed following the standard protocol without intravenous contrast. High resolution imaging of the lungs, as well as inspiratory and expiratory imaging, was performed. COMPARISON:  Multiple priors, most recently 09/27/2015. FINDINGS: Cardiovascular: Heart size is mildly enlarged. There is no significant pericardial fluid, thickening or pericardial calcification. There is aortic atherosclerosis, as well as atherosclerosis of the great vessels of the mediastinum and the coronary arteries, including calcified atherosclerotic plaque in the left main, left anterior descending, left circumflex and right coronary arteries. Status post median sternotomy for CABG, including LIMA to the LAD. Curvilinear hypoattenuation in the myocardium of the mid to distal LAD territory, suggestive of fibrofatty metaplasia related to prior LAD territory myocardial infarction. Mediastinum/Nodes: There are multiple prominent but nonenlarged mediastinal and bilateral hilar lymph nodes, many of which are densely calcified (nonspecific). Small hiatal hernia. Surgical clips near the gastroesophageal junction. No axillary lymphadenopathy. Lungs/Pleura: High-resolution again demonstrates some patchy areas of peripheral predominant ground-glass attenuation. This has no definitive craniocaudal gradient. Some  patchy areas of associated septal thickening and subpleural reticulation with scattered areas of very mild cylindrical bronchiectasis are also noted, essentially unchanged compared to the prior study. No honeycombing. Inspiratory and expiratory imaging demonstrates some moderate air trapping, indicative of small airways disease. Scarring in the inferior segment of the lingula is unchanged. No acute consolidative airspace disease. No pleural effusions. No definite  suspicious appearing pulmonary nodules or masses are noted. Scattered calcified granulomas. Upper Abdomen: Calcified granuloma [place] in the liver and spleen. Status post cholecystectomy. Aortic atherosclerosis. Musculoskeletal: Median sternotomy wires. IMPRESSION: 1. The appearance of the chest appears essentially unchanged compared to the prior study from 09/27/2015, again most suggestive of underlying nonspecific interstitial pneumonia (NSIP) as a manifestation of rheumatoid lung disease. No definitive findings to suggest UIP at this time. 2. Aortic atherosclerosis, in addition to left main and 3 vessel coronary artery disease. Status post median sternotomy for CABG. 3. Old granulomatous disease, as above. Electronically Signed   By: Vinnie Langton M.D.   On: 01/24/2016 10:16      has a past medical history of Carotid artery occlusion; Chronic back pain; CLL (chronic lymphoblastic leukemia) (dx 2010); Coronary artery disease (2010); Diverticulosis; DVT, lower extremity, recurrent ([place]) (2008, 2009); Esophagitis; Fibromyalgia; Gallstones; GERD (gastroesophageal reflux disease); Gout; Gynecomastia, male; H/O hiatal hernia (2008); Hemorrhoids; Hepatitis A (yrs ago); HIV infection ([place]) (dx 1993); Hypertension; Impotence of organic origin; Myocardial infarction ([place]) (2010); Neuromuscular disorder ([place]); Osteoarthritis, knee; Pneumonia (11/2015); Rheumatoid arthritis(714.0) (dx 2010); Seasonal allergies; Secondary syphilis (07/24/14 dx); Status post dilation of esophageal narrowing; TIA (transient ischemic attack) (1997); Tubular adenoma of colon; and Type II diabetes mellitus with manifestations ([place]) (04/02/2011).   reports that he has never smoked. He has never used smokeless tobacco.  Past Surgical History:  Procedure Laterality Date  . CHOLECYSTECTOMY    . COLONOSCOPY WITH PROPOFOL N/A 12/28/2012   Procedure: COLONOSCOPY WITH PROPOFOL;  Surgeon: Jerene Bears, MD;  Location: [facility];  Service:  Gastroenterology;  Laterality: N/A;  . CORONARY ARTERY BYPASS GRAFT  2010   triple bypass  . ESOPHAGOGASTRODUODENOSCOPY (EGD) WITH PROPOFOL N/A 12/28/2012   Procedure: ESOPHAGOGASTRODUODENOSCOPY (EGD) WITH PROPOFOL;  Surgeon: Jerene Bears, MD;  Location: [facility];  Service: Gastroenterology;  Laterality: N/A;  . ESOPHAGOGASTRODUODENOSCOPY (EGD) WITH PROPOFOL N/A 03/15/2013   Procedure: ESOPHAGOGASTRODUODENOSCOPY (EGD) WITH PROPOFOL;  Surgeon: Jerene Bears, MD;  Location: [facility];  Service: Gastroenterology;  Laterality: N/A;  . HARDWARE REMOVAL N/A 07/02/2012   Procedure: HARDWARE REMOVAL;  Surgeon: Elaina Hoops, MD;  Location: [facility];  Service: Neurosurgery;  Laterality: N/A;  . HIATAL HERNIA REPAIR     wrap  . INGUINAL HERNIA REPAIR Bilateral   . JOINT REPLACEMENT Left 1999  . KNEE ARTHROPLASTY  07/22/2011   Procedure: COMPUTER ASSISTED TOTAL KNEE ARTHROPLASTY;  Surgeon: Meredith Pel, MD;  Location: [facility];  Service: Orthopedics;  Laterality: Right;  Right total knee arthroplasty  . MANDIBLE SURGERY Bilateral    tmj  . REPLACEMENT TOTAL KNEE Bilateral   . ring around testicle hernia reapir  184 and 1986   x 2  . ROTATOR CUFF REPAIR Right   . SHOULDER SURGERY Left   . [place]  2010   "rod and screws", "failed", lopwer spine,   . TONSILLECTOMY    . UMBILICAL HERNIA REPAIR     x 1  . varicose vein     stripping  . VIDEO BRONCHOSCOPY Bilateral 10/16/2015   Procedure: VIDEO BRONCHOSCOPY WITHOUT FLUORO;  Surgeon: Brand Males, MD;  Location: [facility];  Service: Cardiopulmonary;  Laterality: Bilateral;    Allergies  Allergen Reactions  . Morphine Other (See Comments)    REACTION: severe headache  . Other Anaphylaxis and Hives    Pecan  . Peanut-Containing Drug Products Anaphylaxis and Hives    Swelling of throat  . Oxycodone-Acetaminophen Other (See Comments)    REACTION: headache  . Penicillins Rash    Has patient had a PCN reaction causing immediate  rash, facial/tongue/throat swelling, SOB or lightheadedness with hypotension: Yes Has patient had a PCN reaction causing severe rash involving mucus membranes or skin necrosis: No Has patient had a PCN reaction that required hospitalization No Has patient had a PCN reaction occurring within the last 10 years: No If all of the above answers are "NO", then may proceed with Cephalosporin use.   REACTION: red, flushed  . Promethazine Hcl Other (See Comments)    REACTION: makes him feel drunk    Immunization History  Administered Date(s) Administered  . Hepatitis B, adult 09/08/2013, 10/27/2013, 03/02/2014  . Influenza Split 01/17/2011, 02/11/2012, 02/18/2013, 02/10/2015  . Influenza, High Dose Seasonal PF 02/02/2014  . Pneumococcal Conjugate-13 08/10/2014  . Pneumococcal Polysaccharide-23 01/16/2009, 10/27/2013  . Td 11/19/2011  . Zoster 03/02/2014    Family History  Problem Relation Age of Onset  . Breast cancer Mother   . Hypertension Mother   . Hyperlipidemia Mother   . Diabetes Mother   . Prostate cancer Father   . Colon polyps Father   . Hyperlipidemia Father   . Crohn's disease Paternal Aunt   . Diabetes Maternal Grandmother   . Diabetes Brother     x 3  . Heart disease Brother     x 3  . Hyperlipidemia Brother     x 3  . Alcohol abuse Daughter   . Drug abuse Daughter   . Asthma Brother   . Colon cancer Neg Hx      Current Outpatient Prescriptions:  .  aspirin EC 81 MG tablet, Take 81 mg by mouth daily., Disp: , Rfl:  .  atorvastatin (LIPITOR) 10 MG tablet, TAKE 1 TABLET (10 MG TOTAL) BY MOUTH AT BEDTIME., Disp: 90 tablet, Rfl: 2 .  EPINEPHrine (EPIPEN 2-PAK) 0.3 mg/0.3 mL IJ SOAJ injection, Inject 0.3 mLs (0.3 mg total) into the muscle once., Disp: 2 Device, Rfl: 0 .  furosemide (LASIX) 20 MG tablet, TAKE 1 TABLET (20 MG TOTAL) BY MOUTH 2 (TWO) TIMES DAILY., Disp: 60 tablet, Rfl: 5 .  GENVOYA 150-150-200-10 MG TABS tablet, TAKE 1 TABLET BY MOUTH DAILY WITH  BREAKFAST., Disp: 30 tablet, Rfl: 5 .  loratadine (CLARITIN) 10 MG tablet, Take 1 tablet (10 mg total) by mouth daily., Disp: 90 tablet, Rfl: 3 .  LYRICA 200 MG capsule, TAKE 1 CAPSULE BY MOUTH TWICE A DAY, Disp: 60 capsule, Rfl: 1 .  metoprolol succinate (TOPROL-XL) 25 MG 24 hr tablet, Take 1 tablet (25 mg total) by mouth at bedtime., Disp: 30 tablet, Rfl: 0 .  pantoprazole (PROTONIX) 40 MG tablet, Take 1 tablet (40 mg total) by mouth 2 (two) times daily before a meal., Disp: 60 tablet, Rfl: 3 .  PREZISTA 800 MG tablet, TAKE 1 TABLET (800 MG TOTAL) BY MOUTH DAILY., Disp: 30 tablet, Rfl: 5 .  saccharomyces boulardii (FLORASTOR) 250 MG capsule, Take 1 capsule (250 mg total) by mouth 2 (two) times daily., Disp: 60 capsule, Rfl: 1 .  SAVAYSA 30 MG TABS tablet, TAKE 1 TABLET (  30 MG TOTAL) BY MOUTH DAILY., Disp: 30 tablet, Rfl: 3 .  sulfamethoxazole-trimethoprim (BACTRIM DS,SEPTRA DS) 800-160 MG tablet, TAKE 1 TABLET BY MOUTH DAILY., Disp: 30 tablet, Rfl: 3 .  valACYclovir (VALTREX) 500 MG tablet, TAKE 1 TABLET BY MOUTH EVERY DAY, Disp: 90 tablet, Rfl: 3 .  zolpidem (AMBIEN) 5 MG tablet, TAKE 1 TABLET BY MOUTH AT BEDTIME AS NEEDED FOR SLEEP, Disp: 30 tablet, Rfl: 2 .  ciclopirox (LOPROX) 0.77 % SUSP, Apply 1 Act topically 2 (two) times daily. (Patient not taking: Reported on 01/24/2016), Disp: 60 mL, Rfl: 5 .  inFLIXimab (REMICADE) 100 MG injection, Inject into the vein every 5 (five) weeks. , Disp: , Rfl:    Review of Systems     Objective:   Physical Exam  Constitutional: He is oriented to person, place, and time. He appears well-developed and well-nourished. No distress.  HENT:  Head: Normocephalic and atraumatic.  Right Ear: External ear normal.  Left Ear: External ear normal.  Mouth/Throat: Oropharynx is clear and moist. No oropharyngeal exudate.  Eyes: Conjunctivae and EOM are normal. Pupils are equal, round, and reactive to light. Right eye exhibits no discharge. Left eye exhibits no  discharge. No scleral icterus.  Neck: Normal range of motion. Neck supple. No JVD present. No tracheal deviation present. No thyromegaly present.  Cardiovascular: Normal rate, regular rhythm and intact distal pulses.  Exam reveals no gallop and no friction rub.   No murmur heard. Pulmonary/Chest: Effort normal. No respiratory distress. He has no wheezes. He has rales. He exhibits no tenderness.  Crackles in base  Abdominal: Soft. Bowel sounds are normal. He exhibits no distension and no mass. There is no tenderness. There is no rebound and no guarding.  Musculoskeletal: Normal range of motion. He exhibits no edema or tenderness.  Has cane  Lymphadenopathy:    He has no cervical adenopathy.  Neurological: He is alert and oriented to person, place, and time. He has normal reflexes. No cranial nerve deficit. Coordination normal.  Skin: Skin is warm and dry. No rash noted. He is not diaphoretic. No erythema. No pallor.  Psychiatric: He has a normal mood and affect. His behavior is normal. Judgment and thought content normal.  Nursing note and vitals reviewed.  Vitals:   01/24/16 1013  BP: 112/66  Pulse: 73  SpO2: 95%  Weight: 190 lb (86.2 kg)  Height: 5\' 5"  (1.651 m)        Assessment:       ICD-9-CM ICD-10-CM   1. Interstitial lung disease ([place]) 515 J84.9   2. Immunocompromised state ([place]) 279.3 D84.9       Plan:      Overall improved and stable ILD that is mild Likely the residue is due to RA We will adopt a monitoring approach  Plan High dose flu shot 01/24/2016 Arlyce Harman with dloc in 6 months  - Pre-bd spiro and dlco only. No lung volume or bd response. No post-bd spiro  Followup - 6 months with spiro at followup     Dr. Brand Males, M.D., [facility].C.P Pulmonary and Critical Care Medicine Staff Physician [facility] Pager: [phone], If no answer or between  15:00h - 7:00h: call [phone]  01/24/2016 10:37  AM

## 2016-01-24 NOTE — Patient Instructions (Signed)
ICD-9-CM ICD-10-CM   1. Interstitial lung disease ([place]) 515 J84.9   2. Immunocompromised state ([place]) 279.3 D84.9    Overall improved and stable ILD that is mild Likely the residue is due to RA We will adopt a monitoring approach  Plan High dose flu shot 01/24/2016 Arlyce Harman with dloc in 6 months  - Pre-bd spiro and dlco only. No lung volume or bd response. No post-bd spiro  Followup - 6 months with spiro at followup

## 2016-01-25 DIAGNOSIS — Z23 Encounter for immunization: Secondary | ICD-10-CM | POA: Diagnosis not present

## 2016-01-28 ENCOUNTER — Telehealth: Payer: Self-pay | Admitting: Internal Medicine

## 2016-01-28 NOTE — Telephone Encounter (Signed)
Left voicemail for patient to call back. 

## 2016-01-28 NOTE — Telephone Encounter (Signed)
I have spoken to patient who verbalizes understanding that we need to move his procedure to 10:30 am on 02/07/16 rather than 8:30 am. I have asked that he be at the hospital at 9 am for the 10:30 test. He verbalizes understanding.

## 2016-01-31 DIAGNOSIS — M0609 Rheumatoid arthritis without rheumatoid factor, multiple sites: Secondary | ICD-10-CM | POA: Diagnosis not present

## 2016-02-01 ENCOUNTER — Encounter (HOSPITAL_COMMUNITY): Payer: Self-pay | Admitting: *Deleted

## 2016-02-04 NOTE — Progress Notes (Signed)
Left message for patient to come in at 0930 because his appt was moved to 1115.

## 2016-02-07 ENCOUNTER — Encounter (HOSPITAL_COMMUNITY): Payer: Self-pay | Admitting: *Deleted

## 2016-02-07 ENCOUNTER — Ambulatory Visit (HOSPITAL_COMMUNITY): Payer: Medicare Other | Admitting: Anesthesiology

## 2016-02-07 ENCOUNTER — Encounter (HOSPITAL_COMMUNITY)
Admission: RE | Disposition: A | Discharge status: Home / Self Care | Payer: Self-pay | Source: Ambulatory Visit | Attending: Internal Medicine

## 2016-02-07 ENCOUNTER — Ambulatory Visit (HOSPITAL_COMMUNITY)
Admission: RE | Admit: 2016-02-07 | Discharge: 2016-02-07 | Disposition: A | Discharge status: Home / Self Care | Payer: Medicare Other | Source: Ambulatory Visit | Attending: Internal Medicine | Admitting: Internal Medicine

## 2016-02-07 DIAGNOSIS — M797 Fibromyalgia: Secondary | ICD-10-CM | POA: Insufficient documentation

## 2016-02-07 DIAGNOSIS — Z8673 Personal history of transient ischemic attack (TIA), and cerebral infarction without residual deficits: Secondary | ICD-10-CM | POA: Diagnosis not present

## 2016-02-07 DIAGNOSIS — Z7952 Long term (current) use of systemic steroids: Secondary | ICD-10-CM | POA: Diagnosis not present

## 2016-02-07 DIAGNOSIS — Z7982 Long term (current) use of aspirin: Secondary | ICD-10-CM | POA: Insufficient documentation

## 2016-02-07 DIAGNOSIS — M069 Rheumatoid arthritis, unspecified: Secondary | ICD-10-CM | POA: Insufficient documentation

## 2016-02-07 DIAGNOSIS — Z8711 Personal history of peptic ulcer disease: Secondary | ICD-10-CM | POA: Insufficient documentation

## 2016-02-07 DIAGNOSIS — Z96653 Presence of artificial knee joint, bilateral: Secondary | ICD-10-CM | POA: Insufficient documentation

## 2016-02-07 DIAGNOSIS — Z79899 Other long term (current) drug therapy: Secondary | ICD-10-CM | POA: Diagnosis not present

## 2016-02-07 DIAGNOSIS — Z21 Asymptomatic human immunodeficiency virus [HIV] infection status: Secondary | ICD-10-CM | POA: Insufficient documentation

## 2016-02-07 DIAGNOSIS — I739 Peripheral vascular disease, unspecified: Secondary | ICD-10-CM | POA: Diagnosis not present

## 2016-02-07 DIAGNOSIS — I252 Old myocardial infarction: Secondary | ICD-10-CM | POA: Insufficient documentation

## 2016-02-07 DIAGNOSIS — Z856 Personal history of leukemia: Secondary | ICD-10-CM | POA: Insufficient documentation

## 2016-02-07 DIAGNOSIS — Z9049 Acquired absence of other specified parts of digestive tract: Secondary | ICD-10-CM | POA: Diagnosis not present

## 2016-02-07 DIAGNOSIS — Z951 Presence of aortocoronary bypass graft: Secondary | ICD-10-CM | POA: Diagnosis not present

## 2016-02-07 DIAGNOSIS — Z7901 Long term (current) use of anticoagulants: Secondary | ICD-10-CM | POA: Insufficient documentation

## 2016-02-07 DIAGNOSIS — I10 Essential (primary) hypertension: Secondary | ICD-10-CM | POA: Insufficient documentation

## 2016-02-07 DIAGNOSIS — K219 Gastro-esophageal reflux disease without esophagitis: Secondary | ICD-10-CM | POA: Insufficient documentation

## 2016-02-07 DIAGNOSIS — I6521 Occlusion and stenosis of right carotid artery: Secondary | ICD-10-CM | POA: Insufficient documentation

## 2016-02-07 DIAGNOSIS — Z86718 Personal history of other venous thrombosis and embolism: Secondary | ICD-10-CM | POA: Insufficient documentation

## 2016-02-07 DIAGNOSIS — R1310 Dysphagia, unspecified: Secondary | ICD-10-CM | POA: Insufficient documentation

## 2016-02-07 DIAGNOSIS — I2510 Atherosclerotic heart disease of native coronary artery without angina pectoris: Secondary | ICD-10-CM | POA: Insufficient documentation

## 2016-02-07 DIAGNOSIS — K222 Esophageal obstruction: Secondary | ICD-10-CM | POA: Diagnosis not present

## 2016-02-07 HISTORY — PX: ESOPHAGOGASTRODUODENOSCOPY (EGD) WITH PROPOFOL: SHX5813

## 2016-02-07 SURGERY — ESOPHAGOGASTRODUODENOSCOPY (EGD) WITH PROPOFOL
Anesthesia: Monitor Anesthesia Care

## 2016-02-07 MED ORDER — SODIUM CHLORIDE 0.9 % IV SOLN: INTRAVENOUS | Status: DC

## 2016-02-07 MED ORDER — PROPOFOL 500 MG/50ML IV EMUL
INTRAVENOUS | Status: DC | PRN
  Administered 2016-02-07: 100 ug/kg/min via INTRAVENOUS

## 2016-02-07 MED ORDER — LACTATED RINGERS IV SOLN
INTRAVENOUS | Status: DC
  Administered 2016-02-07 (×2): via INTRAVENOUS
  Administered 2016-02-07: 1000 mL via INTRAVENOUS

## 2016-02-07 MED ORDER — ONDANSETRON HCL 4 MG/2ML IJ SOLN
INTRAMUSCULAR | Status: AC
  Filled 2016-02-07: qty 2

## 2016-02-07 MED ORDER — MIDAZOLAM HCL 5 MG/5ML IJ SOLN
INTRAMUSCULAR | Status: DC | PRN
  Administered 2016-02-07: 2 mg via INTRAVENOUS

## 2016-02-07 MED ORDER — GLYCOPYRROLATE 0.2 MG/ML IV SOSY
PREFILLED_SYRINGE | INTRAVENOUS | Status: AC
  Filled 2016-02-07: qty 3

## 2016-02-07 MED ORDER — MIDAZOLAM HCL 2 MG/2ML IJ SOLN
INTRAMUSCULAR | Status: AC
  Filled 2016-02-07: qty 2

## 2016-02-07 MED ORDER — PROPOFOL 10 MG/ML IV BOLUS
INTRAVENOUS | Status: AC
  Filled 2016-02-07: qty 40

## 2016-02-07 MED ORDER — ONDANSETRON HCL 4 MG/2ML IJ SOLN
INTRAMUSCULAR | Status: AC
  Filled 2016-02-07: qty 4

## 2016-02-07 MED ORDER — ONDANSETRON HCL 4 MG/2ML IJ SOLN
INTRAMUSCULAR | Status: DC | PRN
  Administered 2016-02-07: 4 mg via INTRAVENOUS

## 2016-02-07 SURGICAL SUPPLY — 14 items

## 2016-02-07 NOTE — Op Note (Signed)
[facility] Patient Name: [name] Procedure Date: 02/07/2016 MRN: [id number] Attending MD: Jerene Bears , MD Date of Birth: 11-10-1950 CSN: [id number] Age: 65 Admit Type: Outpatient Procedure:                Upper GI endoscopy Indications:              Dysphagia, Gastro-esophageal reflux disease Providers:                Lajuan Lines. Hilarie Fredrickson, MD, Laverta Baltimore RN, RN, Alfonso Patten, Technician, Arnoldo Hooker, CRNA Referring MD:              Medicines:                Monitored Anesthesia Care Complications:            No immediate complications. Estimated Blood Loss:     Estimated blood loss: none. Procedure:                Pre-Anesthesia Assessment:                           - Prior to the procedure, a History and Physical                            was performed, and patient medications and                            allergies were reviewed. The patient's tolerance of                            previous anesthesia was also reviewed. The risks                            and benefits of the procedure and the sedation                            options and risks were discussed with the patient.                            All questions were answered, and informed consent                            was obtained. Prior Anticoagulants: The patient has                            taken anticoagulant medication, last dose was 4                            days prior to procedure. ASA Grade Assessment: III                            - A patient with severe systemic disease. After  reviewing the risks and benefits, the patient was                            deemed in satisfactory condition to undergo the                            procedure.                           After obtaining informed consent, the endoscope was                            passed under direct vision. Throughout the                            procedure, the  patient's blood pressure, pulse, and                            oxygen saturations were monitored continuously. The                            Endoscope was introduced through the mouth, and                            advanced to the second part of duodenum. The upper                            GI endoscopy was accomplished without difficulty.                            The patient tolerated the procedure well. Scope In: Scope Out: Findings:      The examined esophagus was normal.      Esophagogastric landmarks were identified: the gastroesophageal junction       was found at 36 cm from the incisors.      No endoscopic abnormality was evident in the esophagus to explain the       patient's complaint of dysphagia. It was decided, however, to proceed       with dilation at the gastroesophageal junction. A TTS dilator was passed       through the scope. Dilation with a 15-16.5-18 mm balloon dilator was       performed to 18 mm.      The entire examined stomach was normal. Retroflexed views did not reveal       a significant recurrent hiatal hernia.      The examined duodenum was normal. Impression:               - No endoscopic esophageal abnormality to explain                            patient's dysphagia. Esophagus dilated. Dilated.                           - Normal stomach. Retroflexed views did not show  significant hiatal hernia.                           - Normal examined duodenum. Moderate Sedation:      N/A Recommendation:           - Patient has a contact number available for                            emergencies. The signs and symptoms of potential                            delayed complications were discussed with the                            patient. Return to normal activities tomorrow.                            Written discharge instructions were provided to the                            patient.                           - Resume previous  diet.                           - Continue present medications.                           - Return to GI clinic at the next available                            appointment. Procedure Code(s):        --- Professional ---                           [phone], Esophagogastroduodenoscopy, flexible,                            transoral; with transendoscopic balloon dilation of                            esophagus (less than 30 mm diameter) Diagnosis Code(s):        --- Professional ---                           R13.10, Dysphagia, unspecified                           K21.9, Gastro-esophageal reflux disease without                            esophagitis CPT copyright 2016 American Medical Association. All rights reserved. The codes documented in this report are preliminary and upon coder review may  be revised to meet current compliance requirements. Jerene Bears, MD 02/07/2016 11:35:33 AM This report has been signed electronically. Number of Addenda: 0

## 2016-02-07 NOTE — Transfer of Care (Signed)
Immediate Anesthesia Transfer of Care Note  Patient: [name]  Procedure(s) Performed: Procedure(s): ESOPHAGOGASTRODUODENOSCOPY (EGD) WITH PROPOFOL (N/A)  Patient Location: ENDO  Anesthesia Type:MAC  Level of Consciousness:  sedated, patient cooperative and responds to stimulation  Airway & Oxygen Therapy:Patient Spontanous Breathing and Patient connected to face mask oxgen  Post-op Assessment:  Report given to ENDO RN and Post -op Vital signs reviewed and stable  Post vital signs:  Reviewed and stable  Last Vitals:  Vitals:   02/07/16 0927  BP: 128/79  Pulse: 70  Resp: 16  Temp: [id number] C    Complications: No apparent anesthesia complications

## 2016-02-07 NOTE — Anesthesia Preprocedure Evaluation (Addendum)
Anesthesia Evaluation  Patient identified by MRN, date of birth, ID band Patient awake    Reviewed: Allergy & Precautions, NPO status , Patient's Chart, lab work & pertinent test results  Airway Mallampati: II  TM Distance: >3 FB Neck ROM: Full    Dental  (+) Teeth Intact, Dental Advisory Given   Pulmonary    breath sounds clear to auscultation       Cardiovascular hypertension, Pt. on home beta blockers + CAD, + Past MI and + Peripheral Vascular Disease   Rhythm:Regular Rate:Normal     Neuro/Psych PSYCHIATRIC DISORDERS Depression TIA Neuromuscular disease    GI/Hepatic hiatal hernia, PUD, GERD  Medicated,(+) Hepatitis -, A  Endo/Other  negative endocrine ROS  Renal/GU negative Renal ROS  negative genitourinary   Musculoskeletal   Abdominal   Peds negative pediatric ROS (+)  Hematology negative hematology ROS (+)   Anesthesia Other Findings   Reproductive/Obstetrics                            Anesthesia Physical Anesthesia Plan  ASA: II  Anesthesia Plan: MAC   Post-op Pain Management:    Induction: Intravenous  Airway Management Planned: Natural Airway  Additional Equipment:   Intra-op Plan:   Post-operative Plan:   Informed Consent: I have reviewed the patients History and Physical, chart, labs and discussed the procedure including the risks, benefits and alternatives for the proposed anesthesia with the patient or authorized representative who has indicated his/her understanding and acceptance.     Plan Discussed with: CRNA  Anesthesia Plan Comments:         Anesthesia Quick Evaluation

## 2016-02-07 NOTE — H&P (Signed)
HPI: 65 yo male with PMH of GERD, erosive esophagitis, adenomatous colon polyps, HIV, ILD, RA and CLL who presents for outpatient EGD to evaluate dysphagia and worsening reflux. Recent barium swallow shows intact motility and small recurrent hernia. He's had a history of Nissen fundoplication. He reports early satiety. Prior dilation he found extremely helpful. This was performed in 2014 across the GE junction to 18 mm with balloon.  He has been off of his anticoagulation, Savaysa x 4 days.  Past Medical History:  Diagnosis Date  . Carotid artery occlusion    40-60% right ICA stenosis (09/2008)  . Chronic back pain    follows with Nsurg  . CLL (chronic lymphoblastic leukemia) dx 2010   [facility]ed [facility] q55[facility], no current therapy   . Coronary artery disease 2010   s/p CABG '10, sees Dr. Percival Spanish  . Diverticulosis   . DVT, lower extremity, recurrent ([place]) 2008, 2009   LLE, chronic anticoag since 2009  . Esophagitis   . Fibromyalgia   . Gallstones   . GERD (gastroesophageal reflux disease)   . Gout   . Gynecomastia, male   . H/O hiatal hernia 2008   surgery  . Hemorrhoids   . Hepatitis A yrs ago  . HIV infection ([place]) dx 1993  . Hypertension   . Impotence of organic origin   . Myocardial infarction ([place]) 2010    x 2  . Neuromuscular disorder (HCC)    neuropathy  . Osteoarthritis, knee    s/p B TKA  . Pneumonia mrach, may, july 2017  . Rheumatoid arthritis(714.0) dx 2010   MTX, follows with rheum  . Seasonal allergies   . Secondary syphilis 07/24/14 dx   s/p 2 wks doxy  . Status post dilation of esophageal narrowing   . TIA (transient ischemic attack) 1997   mild residual L mouth droop  . Tubular adenoma of colon     Past Surgical History:  Procedure Laterality Date  . CHOLECYSTECTOMY    . COLONOSCOPY WITH PROPOFOL N/A 12/28/2012   Procedure: COLONOSCOPY WITH PROPOFOL;  Surgeon: Jerene Bears, MD;  [facility];  Service: Gastroenterology;  Laterality: N/A;  .  CORONARY ARTERY BYPASS GRAFT  2010   triple bypass  . ESOPHAGOGASTRODUODENOSCOPY (EGD) WITH PROPOFOL N/A 12/28/2012   Procedure: ESOPHAGOGASTRODUODENOSCOPY (EGD) WITH PROPOFOL;  Surgeon: Jerene Bears, MD;  [facility];  Service: Gastroenterology;  Laterality: N/A;  . ESOPHAGOGASTRODUODENOSCOPY (EGD) WITH PROPOFOL N/A 03/15/2013   Procedure: ESOPHAGOGASTRODUODENOSCOPY (EGD) WITH PROPOFOL;  Surgeon: Jerene Bears, MD;  [facility];  Service: Gastroenterology;  Laterality: N/A;  . HARDWARE REMOVAL N/A 07/02/2012   Procedure: HARDWARE REMOVAL;  Surgeon: Elaina Hoops, MD;  Location: [facility];  Service: Neurosurgery;  Laterality: N/A;  . HIATAL HERNIA REPAIR     wrap  . INGUINAL HERNIA REPAIR Bilateral   . JOINT REPLACEMENT Left 1999  . KNEE ARTHROPLASTY  07/22/2011   Procedure: COMPUTER ASSISTED TOTAL KNEE ARTHROPLASTY;  Surgeon: Meredith Pel, MD;  Location: [place];  Service: Orthopedics;  Laterality: Right;  Right total knee arthroplasty  . MANDIBLE SURGERY Bilateral    tmj  . REPLACEMENT TOTAL KNEE Bilateral   . ring around testicle hernia reapir  184 and 1986   x 2  . ROTATOR CUFF REPAIR Right   . SHOULDER SURGERY Left   . SPINE SURGERY  2010   "rod and screws", "failed", lopwer spine,   . stent to heart x 1  2010  .  TONSILLECTOMY    . UMBILICAL HERNIA REPAIR     x 1  . varicose vein     stripping  . VIDEO BRONCHOSCOPY Bilateral 10/16/2015   Procedure: VIDEO BRONCHOSCOPY WITHOUT FLUORO;  Surgeon: Brand Males, MD;  [facility];  Service: Cardiopulmonary;  Laterality: Bilateral;     (Not in an outpatient encounter)  Allergies  Allergen Reactions  . Morphine Other (See Comments)    REACTION: severe headache  . Other Anaphylaxis and Hives    Pecan  . Peanut-Containing Drug Products Anaphylaxis and Hives    Swelling of throat  . Oxycodone-Acetaminophen Other (See Comments)    REACTION: headache  . Penicillins Rash    Has patient had a PCN  reaction causing immediate rash, facial/tongue/throat swelling, SOB or lightheadedness with hypotension: Yes Has patient had a PCN reaction causing severe rash involving mucus membranes or skin necrosis: No Has patient had a PCN reaction that required hospitalization No Has patient had a PCN reaction occurring within the last 10 years: No If all of the above answers are "NO", then may proceed with Cephalosporin use.   REACTION: red, flushed  . Promethazine Hcl Other (See Comments)    REACTION: makes him feel drunk    Family History  Problem Relation Age of Onset  . Breast cancer Mother   . Hypertension Mother   . Hyperlipidemia Mother   . Diabetes Mother   . Prostate cancer Father   . Colon polyps Father   . Hyperlipidemia Father   . Crohn's disease Paternal Aunt   . Diabetes Maternal Grandmother   . Diabetes Brother     x 3  . Heart disease Brother     x 3  . Hyperlipidemia Brother     x 3  . Alcohol abuse Daughter   . Drug abuse Daughter   . Asthma Brother   . Colon cancer Neg Hx     Social History  Substance Use Topics  . Smoking status: Never Smoker  . Smokeless tobacco: Never Used     Comment: occ wine  . Alcohol use Yes     Comment: occasional wine- 1-2 per week    ROS: As per history of present illness, otherwise negative  BP 128/79   Pulse 70   Temp 97.6 F (36.4 C) (Oral)   Resp 16   Ht 5\' 5"  (1.651 m)   Wt 190 lb (86.2 kg)   SpO2 100%   BMI 31.62 kg/m  Gen: awake, alert, NAD HEENT: anicteric, op clear CV: RRR, no mrg Pulm: CTA b/l Abd: soft, NT/ND, +BS throughout Ext: no c/c/e Neuro: nonfocal .  RELEVANT LABS AND IMAGING: CBC    Component Value Date/Time   WBC 14.3 (H) 11/29/2015 0857   WBC 17.1 (H) 08/30/2015 1538   RBC 4.45 11/29/2015 0857   RBC 4.20 08/30/2015 1538   HGB 13.2 11/29/2015 0857   HCT 40.6 11/29/2015 0857   PLT 130 (L) 11/29/2015 0857   MCV 91.2 11/29/2015 0857   MCH 29.7 11/29/2015 0857   MCH 30.0 08/30/2015  1538   MCHC 32.5 11/29/2015 0857   MCHC 32.2 08/30/2015 1538   RDW 16.6 (H) 11/29/2015 0857   LYMPHSABS 11.0 (H) 11/29/2015 0857   MONOABS 1.0 (H) 11/29/2015 0857   EOSABS 0.1 11/29/2015 0857   BASOSABS 0.0 11/29/2015 0857    CMP     Component Value Date/Time   NA 141 11/29/2015 0857   K 3.9 11/29/2015 0857   CL 105 09/06/2015  1130   CO2 30 (H) 11/29/2015 0857   GLUCOSE 88 11/29/2015 0857   BUN 23.0 11/29/2015 0857   CREATININE 1.1 11/29/2015 0857   CALCIUM 8.4 11/29/2015 0857   PROT 5.8 (L) 11/29/2015 0857   ALBUMIN 3.6 11/29/2015 0857   AST 25 11/29/2015 0857   ALT 30 11/29/2015 0857   ALKPHOS 68 11/29/2015 0857   BILITOT 0.55 11/29/2015 0857   GFRNONAA >60 07/13/2015 0719   GFRNONAA 85 11/20/2014 1527   GFRAA >60 07/13/2015 0719   GFRAA >89 11/20/2014 1527    ASSESSMENT/PLAN: 65 yo male with PMH of GERD, erosive esophagitis, adenomatous colon polyps, HIV, ILD, RA and CLL who presents for outpatient EGD to evaluate dysphagia and worsening reflux.  1. GERD/esophageal dysphagia -- upper endoscopy today with possible dilation. We'll also rule out recurrent esophagitis and Candida. Evaluate recurrent hiatal hernia given his history of Nissen. The nature of the procedure, as well as the risks, benefits, and alternatives were carefully and thoroughly reviewed with the patient. Ample time for discussion and questions allowed. The patient understood, was satisfied, and agreed to proceed.

## 2016-02-07 NOTE — Discharge Instructions (Signed)

## 2016-02-07 NOTE — Anesthesia Postprocedure Evaluation (Signed)
Anesthesia Post Note  Patient: AANAV BREDBENNER  Procedure(s) Performed: Procedure(s) (LRB): ESOPHAGOGASTRODUODENOSCOPY (EGD) WITH PROPOFOL (N/A)  Patient location during evaluation: PACU Anesthesia Type: MAC Level of consciousness: awake and alert Pain management: pain level controlled Vital Signs Assessment: post-procedure vital signs reviewed and stable Respiratory status: spontaneous breathing, nonlabored ventilation, respiratory function stable and patient connected to nasal cannula oxygen Cardiovascular status: stable and blood pressure returned to baseline Anesthetic complications: no    Last Vitals:  Vitals:   02/07/16 1135 02/07/16 1140  BP:  135/84  Pulse: (!) 59 (!) 58  Resp: 16 12  Temp:      Last Pain:  Vitals:   02/07/16 1125  TempSrc: Oral                 Effie Berkshire

## 2016-02-08 ENCOUNTER — Encounter (HOSPITAL_COMMUNITY): Payer: Self-pay | Admitting: Internal Medicine

## 2016-02-12 DIAGNOSIS — E119 Type 2 diabetes mellitus without complications: Secondary | ICD-10-CM | POA: Diagnosis not present

## 2016-02-12 DIAGNOSIS — H538 Other visual disturbances: Secondary | ICD-10-CM | POA: Diagnosis not present

## 2016-02-12 LAB — HM DIABETES EYE EXAM

## 2016-02-13 ENCOUNTER — Other Ambulatory Visit: Payer: Self-pay | Admitting: Cardiology

## 2016-02-14 ENCOUNTER — Ambulatory Visit: Payer: Self-pay | Admitting: Infectious Diseases

## 2016-02-14 DIAGNOSIS — Z21 Asymptomatic human immunodeficiency virus [HIV] infection status: Secondary | ICD-10-CM | POA: Diagnosis not present

## 2016-02-14 DIAGNOSIS — M1A09X0 Idiopathic chronic gout, multiple sites, without tophus (tophi): Secondary | ICD-10-CM | POA: Diagnosis not present

## 2016-02-14 DIAGNOSIS — M150 Primary generalized (osteo)arthritis: Secondary | ICD-10-CM | POA: Diagnosis not present

## 2016-02-14 DIAGNOSIS — C9110 Chronic lymphocytic leukemia of B-cell type not having achieved remission: Secondary | ICD-10-CM | POA: Diagnosis not present

## 2016-02-14 DIAGNOSIS — B59 Pneumocystosis: Secondary | ICD-10-CM | POA: Diagnosis not present

## 2016-02-14 DIAGNOSIS — J849 Interstitial pulmonary disease, unspecified: Secondary | ICD-10-CM | POA: Diagnosis not present

## 2016-02-14 DIAGNOSIS — M0609 Rheumatoid arthritis without rheumatoid factor, multiple sites: Secondary | ICD-10-CM | POA: Diagnosis not present

## 2016-02-15 ENCOUNTER — Other Ambulatory Visit: Payer: Self-pay | Admitting: Internal Medicine

## 2016-02-18 ENCOUNTER — Ambulatory Visit (INDEPENDENT_AMBULATORY_CARE_PROVIDER_SITE_OTHER): Payer: Medicare Other | Admitting: Infectious Diseases

## 2016-02-18 ENCOUNTER — Encounter: Payer: Self-pay | Admitting: Infectious Diseases

## 2016-02-18 VITALS — BP 120/77 | HR 80 | Temp 98.0°F | Wt 193.0 lb

## 2016-02-18 DIAGNOSIS — B20 Human immunodeficiency virus [HIV] disease: Secondary | ICD-10-CM

## 2016-02-18 DIAGNOSIS — M05731 Rheumatoid arthritis with rheumatoid factor of right wrist without organ or systems involvement: Secondary | ICD-10-CM | POA: Diagnosis not present

## 2016-02-18 DIAGNOSIS — J849 Interstitial pulmonary disease, unspecified: Secondary | ICD-10-CM

## 2016-02-18 LAB — COMPREHENSIVE METABOLIC PANEL
ALT: 24 U/L (ref 9–46)
AST: 26 U/L (ref 10–35)
Albumin: 3.9 g/dL (ref 3.6–5.1)
Alkaline Phosphatase: 55 U/L (ref 40–115)
BUN: 22 mg/dL (ref 7–25)
CO2: 26 mmol/L (ref 20–31)
Calcium: 8.6 mg/dL (ref 8.6–10.3)
Chloride: 104 mmol/L (ref 98–110)
Creat: 1.08 mg/dL (ref 0.70–1.25)
Glucose, Bld: 111 mg/dL — ABNORMAL HIGH (ref 65–99)
Potassium: 4 mmol/L (ref 3.5–5.3)
Sodium: 142 mmol/L (ref 135–146)
Total Bilirubin: 0.7 mg/dL (ref 0.2–1.2)
Total Protein: 5.7 g/dL — ABNORMAL LOW (ref 6.1–8.1)

## 2016-02-18 LAB — CBC
HCT: 39.7 % (ref 38.5–50.0)
Hemoglobin: 12.7 g/dL — ABNORMAL LOW (ref 13.2–17.1)
MCH: 28.9 pg (ref 27.0–33.0)
MCHC: 32 g/dL (ref 32.0–36.0)
MCV: 90.4 fL (ref 80.0–100.0)
MPV: 8.4 fL (ref 7.5–12.5)
Platelets: 186 10*3/uL (ref 140–400)
RBC: 4.39 MIL/uL (ref 4.20–5.80)
RDW: 15 % (ref 11.0–15.0)
WBC: 16.9 10*3/uL — ABNORMAL HIGH (ref 3.8–10.8)

## 2016-02-18 NOTE — Assessment & Plan Note (Signed)
Off home O2.  Feeling better.  My great appreciation to CCM.

## 2016-02-18 NOTE — Assessment & Plan Note (Signed)
Appears to be doing well.  R flank pain- worse at night. Better after getting up in AM. Consistent with MS strain.  Will check his labs.  rtc in 6 months

## 2016-02-18 NOTE — Assessment & Plan Note (Signed)
Doing better on new rx.

## 2016-02-18 NOTE — Progress Notes (Signed)
   Subjective:    Patient ID: [name], male    DOB: 1950-12-08, 65 y.o.   MRN: [id number]  HPI 65 yo M with hx of HIV+, CLL, pulmonary fibrosis, and RA for which he takes MTX.  He was hospitallized in [place]? For PNA and then returned on 2-20 --> 3-3 with fever, muscle aches and weakness. He was treated with vanco/aztreonam for 5 days then d/c home with bactrim and levaquin.   He is currently on genvoya/darunavir.  Is feeling better- his breathing is better, his arthritis is better. Is on low dose prednisone, cimzia. Feels like his strength is back, his mental faculties are better.   HIV 1 RNA Quant (copies/mL)  Date Value  08/30/2015 <20  07/06/2015 100  11/20/2014 49 (H)   CD4 T Cell Abs (/uL)  Date Value  08/30/2015 230 (L)  07/06/2015 870  11/20/2014 1,400    Review of Systems  Constitutional: Negative for appetite change and unexpected weight change.  Respiratory: Negative for shortness of breath.   Gastrointestinal: Negative for constipation and diarrhea.  Genitourinary: Negative for difficulty urinating.  Musculoskeletal: Negative for arthralgias.       Objective:   Physical Exam  Constitutional: He appears well-developed and well-nourished.  HENT:  Mouth/Throat: No oropharyngeal exudate.  Eyes: EOM are normal. Pupils are equal, round, and reactive to light.  Neck: Neck supple.  Cardiovascular: Normal rate, regular rhythm and normal heart sounds.   Pulmonary/Chest: Effort normal and breath sounds normal.  Abdominal: Soft. Bowel sounds are normal. There is no tenderness. There is no rebound.  Musculoskeletal: He exhibits no edema.       Arms: Lymphadenopathy:    He has no cervical adenopathy.       Assessment & Plan:

## 2016-02-19 LAB — HIV-1 RNA QUANT-NO REFLEX-BLD
HIV 1 RNA Quant: 31 copies/mL — ABNORMAL HIGH (ref <20)
HIV-1 RNA Quant, Log: 1.49 Log copies/mL — ABNORMAL HIGH (ref <1.30)

## 2016-02-19 LAB — HEPATITIS B SURFACE ANTIBODY,QUALITATIVE: Hep B S Ab: NEGATIVE

## 2016-02-20 LAB — T-HELPER CELL (CD4) - (RCID CLINIC ONLY)
CD4 % Helper T Cell: 10 % — ABNORMAL LOW (ref 33–55)
CD4 T Cell Abs: 1140 /uL (ref 400–2700)

## 2016-02-26 ENCOUNTER — Other Ambulatory Visit: Payer: Self-pay | Admitting: Internal Medicine

## 2016-02-26 ENCOUNTER — Encounter: Payer: Self-pay | Admitting: Internal Medicine

## 2016-02-26 DIAGNOSIS — B59 Pneumocystosis: Secondary | ICD-10-CM

## 2016-02-27 NOTE — Telephone Encounter (Signed)
Faxed sent with addended rx signed by PCP.

## 2016-02-28 DIAGNOSIS — M0609 Rheumatoid arthritis without rheumatoid factor, multiple sites: Secondary | ICD-10-CM | POA: Diagnosis not present

## 2016-03-03 ENCOUNTER — Encounter (HOSPITAL_COMMUNITY): Payer: Self-pay | Admitting: Emergency Medicine

## 2016-03-03 ENCOUNTER — Encounter: Payer: Self-pay | Admitting: Internal Medicine

## 2016-03-03 ENCOUNTER — Ambulatory Visit (HOSPITAL_COMMUNITY)
Admission: EM | Admit: 2016-03-03 | Discharge: 2016-03-03 | Disposition: A | Discharge status: Home / Self Care | Payer: Medicare Other | Attending: Family Medicine | Admitting: Family Medicine

## 2016-03-03 DIAGNOSIS — J069 Acute upper respiratory infection, unspecified: Secondary | ICD-10-CM | POA: Diagnosis not present

## 2016-03-03 LAB — POCT RAPID STREP A: Streptococcus, Group A Screen (Direct): NEGATIVE

## 2016-03-03 MED ORDER — FLUTICASONE PROPIONATE 50 MCG/ACT NA SUSP: 2.0000 | Freq: Every day | NASAL | 2 refills | Status: DC

## 2016-03-03 MED ORDER — AZITHROMYCIN 250 MG PO TABS: 250.0000 mg | ORAL_TABLET | Freq: Every day | ORAL | 1 refills | Status: DC

## 2016-03-03 NOTE — ED Provider Notes (Signed)
CSN: [id number]     Arrival date & time 03/03/16  1351 History   None    Chief Complaint  Patient presents with  . Cough  . Sore Throat   (Consider location/radiation/quality/duration/timing/severity/associated sxs/prior Treatment) HPI NP THIS IS A NEW PROBLEM PT PRESENTS WITH SEVERAL DAY HISTORY OF CONGESTION, RUNNY NOSE, COUGH AND WHEEZING. HAS USED MULTIPLE OTC MEDS WITHOUT RELIEF OF SYMPTOMS. DENIES FEVER. COUGH IS DRY NON PRODUCTIVE. NON SMOKER.  PREVIOUS SYMPTOMS OF THIS NATURE. OTHERS AROUND PATIENT HAS BEEN ILL. SOME CHEST PAIN DUE TO COUGH. PAIN SCORE 2.  :  Past Medical History:  Diagnosis Date  . Carotid artery occlusion    40-60% right ICA stenosis (09/2008)  . Chronic back pain    follows with Nsurg  . CLL (chronic lymphoblastic leukemia) dx 2010   [facility]ed [facility], no current therapy   . Coronary artery disease 2010   s/p CABG '10, sees Dr. Percival Spanish  . Diverticulosis   . DVT, lower extremity, recurrent ([place]) 2008, 2009   LLE, chronic anticoag since 2009  . Esophagitis   . Fibromyalgia   . Gallstones   . GERD (gastroesophageal reflux disease)   . Gout   . Gynecomastia, male   . H/O hiatal hernia 2008   surgery  . Hemorrhoids   . Hepatitis A yrs ago  . HIV infection ([place]) dx 1993  . Hypertension   . Impotence of organic origin   . Myocardial infarction 2010    x 2  . Neuromuscular disorder (HCC)    neuropathy  . Osteoarthritis, knee    s/p B TKA  . Pneumonia mrach, may, july 2017  . Rheumatoid arthritis(714.0) dx 2010   MTX, follows with rheum  . Seasonal allergies   . Secondary syphilis 07/24/14 dx   s/p 2 wks doxy  . Status post dilation of esophageal narrowing   . TIA (transient ischemic attack) 1997   mild residual L mouth droop  . Tubular adenoma of colon    Past Surgical History:  Procedure Laterality Date  . CHOLECYSTECTOMY    . COLONOSCOPY WITH PROPOFOL N/A 12/28/2012   Procedure: COLONOSCOPY WITH PROPOFOL;  Surgeon: Jerene Bears,  MD;  [facility];  Service: Gastroenterology;  Laterality: N/A;  . CORONARY ARTERY BYPASS GRAFT  2010   triple bypass  . ESOPHAGOGASTRODUODENOSCOPY (EGD) WITH PROPOFOL N/A 12/28/2012   Procedure: ESOPHAGOGASTRODUODENOSCOPY (EGD) WITH PROPOFOL;  Surgeon: Jerene Bears, MD;  [facility];  Service: Gastroenterology;  Laterality: N/A;  . ESOPHAGOGASTRODUODENOSCOPY (EGD) WITH PROPOFOL N/A 03/15/2013   Procedure: ESOPHAGOGASTRODUODENOSCOPY (EGD) WITH PROPOFOL;  Surgeon: Jerene Bears, MD;  [facility];  Service: Gastroenterology;  Laterality: N/A;  . ESOPHAGOGASTRODUODENOSCOPY (EGD) WITH PROPOFOL N/A 02/07/2016   Procedure: ESOPHAGOGASTRODUODENOSCOPY (EGD) WITH PROPOFOL;  Surgeon: Jerene Bears, MD;  [facility];  Service: Gastroenterology;  Laterality: N/A;  . HARDWARE REMOVAL N/A 07/02/2012   Procedure: HARDWARE REMOVAL;  Surgeon: Elaina Hoops, MD;  Location: [facility];  Service: Neurosurgery;  Laterality: N/A;  . HIATAL HERNIA REPAIR     wrap  . INGUINAL HERNIA REPAIR Bilateral   . JOINT REPLACEMENT Left 1999  . KNEE ARTHROPLASTY  07/22/2011   Procedure: COMPUTER ASSISTED TOTAL KNEE ARTHROPLASTY;  Surgeon: Meredith Pel, MD;  Location: [facility];  Service: Orthopedics;  Laterality: Right;  Right total knee arthroplasty  . MANDIBLE SURGERY Bilateral    tmj  . REPLACEMENT TOTAL KNEE Bilateral   . ring around testicle hernia  reapir  184 and 1986   x 2  . ROTATOR CUFF REPAIR Right   . SHOULDER SURGERY Left   . SPINE SURGERY  2010   "rod and screws", "failed", lopwer spine,   . stent to heart x 1  2010  . TONSILLECTOMY    . UMBILICAL HERNIA REPAIR     x 1  . varicose vein     stripping  . VIDEO BRONCHOSCOPY Bilateral 10/16/2015   Procedure: VIDEO BRONCHOSCOPY WITHOUT FLUORO;  Surgeon: Brand Males, MD;  [facility];  Service: Cardiopulmonary;  Laterality: Bilateral;   Family History  Problem Relation Age of Onset  . Breast cancer Mother    . Hypertension Mother   . Hyperlipidemia Mother   . Diabetes Mother   . Prostate cancer Father   . Colon polyps Father   . Hyperlipidemia Father   . Crohn's disease Paternal Aunt   . Diabetes Maternal Grandmother   . Diabetes Brother     x 3  . Heart disease Brother     x 3  . Hyperlipidemia Brother     x 3  . Alcohol abuse Daughter   . Drug abuse Daughter   . Asthma Brother   . Colon cancer Neg Hx    Social History  Substance Use Topics  . Smoking status: Never Smoker  . Smokeless tobacco: Never Used     Comment: occ wine  . Alcohol use Yes     Comment: occasional wine- 1-2 per week    Review of Systems  Denies: HEADACHE, NAUSEA, ABDOMINAL PAIN, CHEST PAIN, CONGESTION, DYSURIA, SHORTNESS OF BREATH  Allergies  Morphine; Other; Peanut-containing drug products; Oxycodone-acetaminophen; Penicillins; and Promethazine hcl  Home Medications   Prior to Admission medications   Medication Sig Start Date End Date Taking? Authorizing Provider  aspirin EC 81 MG tablet Take 81 mg by mouth daily.   Yes Historical Provider, MD  atorvastatin (LIPITOR) 10 MG tablet TAKE 1 TABLET (10 MG TOTAL) BY MOUTH AT BEDTIME. 12/12/15  Yes [name], MD  furosemide (LASIX) 20 MG tablet TAKE 1 TABLET (20 MG TOTAL) BY MOUTH 2 (TWO) TIMES DAILY. 11/12/15  Yes [name], MD  GENVOYA 150-150-200-10 MG TABS tablet TAKE 1 TABLET BY MOUTH DAILY WITH BREAKFAST. 12/12/15  Yes Campbell Riches, MD  loratadine (CLARITIN) 10 MG tablet Take 1 tablet (10 mg total) by mouth daily. 03/13/15  Yes [name], MD  LYRICA 200 MG capsule TAKE 1 CAPSULE BY MOUTH TWICE A DAY 01/16/16  Yes Campbell Riches, MD  metoprolol succinate (TOPROL-XL) 25 MG 24 hr tablet TAKE 1 TABLET BY MOUTH AT BEDTIME. *PLEASE FOLLOW UP FOR FUTURE REFILLS WITH PRIMARY DOCTOR 02/13/16  Yes [name], MD  pantoprazole (PROTONIX) 40 MG tablet Take 1 tablet (40 mg total) by mouth 2 (two) times daily before a meal. Patient taking  differently: Take 40 mg by mouth every evening.  01/03/16  Yes Jerene Bears, MD  predniSONE (DELTASONE) 5 MG tablet Take 5 mg by mouth daily with breakfast.    Yes Historical Provider, MD  PREZISTA 800 MG tablet TAKE 1 TABLET (800 MG TOTAL) BY MOUTH DAILY. 12/12/15  Yes Campbell Riches, MD  saccharomyces boulardii (FLORASTOR) 250 MG capsule Take 1 capsule (250 mg total) by mouth 2 (two) times daily. 02/05/15  Yes [name] S Esterwood, PA-C  SAVAYSA 30 MG TABS tablet TAKE 1 TABLET (30 MG TOTAL) BY MOUTH DAILY. 12/12/15  Yes [name], MD  sulfamethoxazole-trimethoprim (BACTRIM DS,SEPTRA DS) 800-160 MG tablet TAKE 1 TABLET BY MOUTH DAILY. 12/12/15  Yes [name], MD  sulfamethoxazole-trimethoprim (BACTRIM DS,SEPTRA DS) 800-160 MG tablet TAKE 1 TABLET BY MOUTH EVERY 12 (TWELVE) HOURS. 02/26/16  Yes [name], MD  valACYclovir (VALTREX) 500 MG tablet TAKE 1 TABLET BY MOUTH EVERY DAY 04/09/15  Yes Thayer Headings, MD  azithromycin (ZITHROMAX) 250 MG tablet Take 1 tablet (250 mg total) by mouth daily. Take first 2 tablets together, then 1 every day until finished. 03/03/16   Konrad Felix, PA  Certolizumab Pegol [facility]) Inject into the skin. Please confirm dosage strength  Injectable med replaces Remicade use per patient    Historical Provider, MD  ciclopirox (LOPROX) 0.77 % SUSP Apply 1 Act topically 2 (two) times daily. 12/31/15   [name], MD  EPINEPHrine (EPIPEN 2-PAK) 0.3 mg/0.3 mL IJ SOAJ injection Inject 0.3 mLs (0.3 mg total) into the muscle once. 06/12/14   Rowe Clack, MD  fluticasone (FLONASE) 50 MCG/ACT nasal spray Place 2 sprays into both nostrils daily. 03/03/16   Konrad Felix, PA  zolpidem (AMBIEN) 5 MG tablet TAKE 1 TABLET BY MOUTH AT BEDTIME AS NEEDED FOR SLEEP 06/07/15   Biagio Borg, MD   Meds Ordered and Administered this Visit  Medications - No data to display  BP 132/68 (BP Location: Left Arm)   Pulse 75   Temp 97.8 F (36.6 C) (Oral)   Resp 20   SpO2 99%   No data found.   Physical Exam NURSES NOTES AND VITAL SIGNS REVIEWED. CONSTITUTIONAL: Well developed, well nourished, no acute distress HEENT: normocephalic, atraumatic EYES: Conjunctiva normal NECK:normal ROM, supple, no adenopathy PULMONARY:No respiratory distress, normal effort ABDOMINAL: Soft, ND, NT BS+, No CVAT MUSCULOSKELETAL: Normal ROM of all extremities,  SKIN: warm and dry without rash PSYCHIATRIC: Mood and affect, behavior are normal  Urgent Care Course   Clinical Course    Procedures (including critical care time)  Labs Review Labs Reviewed - No data to display  Imaging Review No results found.   Visual Acuity Review  Right Eye Distance:   Left Eye Distance:   Bilateral Distance:    Right Eye Near:   Left Eye Near:    Bilateral Near:         MDM   1. Upper respiratory tract infection, unspecified type     Patient is reassured that there are no issues that require transfer to higher level of care at this time or additional tests. Patient is advised to continue home symptomatic treatment. Patient is advised that if there are new or worsening symptoms to attend the emergency department, contact primary care provider, or return to UC. Instructions of care provided discharged home in stable condition.    THIS NOTE WAS GENERATED USING A VOICE RECOGNITION SOFTWARE PROGRAM. ALL REASONABLE EFFORTS  WERE MADE TO PROOFREAD THIS DOCUMENT FOR ACCURACY.  I have verbally reviewed the discharge instructions with the patient. A printed AVS was given to the patient.  All questions were answered prior to discharge.      Konrad Felix, [place] 03/03/16 1549

## 2016-03-03 NOTE — ED Triage Notes (Signed)
Patient presents today to [facility] with a complaint of congestion, cough and sore throat. He states that he has been coughing with little clear discharge and this morning turned to yellowish colored discharge. Patient states he has had some chills on and off.  Denies any fever

## 2016-03-06 ENCOUNTER — Ambulatory Visit: Payer: Medicare Other | Admitting: Family

## 2016-03-06 LAB — CULTURE, GROUP A STREP (THRC)

## 2016-03-07 ENCOUNTER — Other Ambulatory Visit: Payer: Self-pay | Admitting: Internal Medicine

## 2016-03-07 MED ORDER — EPINEPHRINE 0.3 MG/0.3ML IJ SOAJ: 0.3000 mg | Freq: Once | INTRAMUSCULAR | 0 refills | Status: DC

## 2016-03-11 ENCOUNTER — Other Ambulatory Visit: Payer: Self-pay | Admitting: Cardiology

## 2016-03-16 ENCOUNTER — Other Ambulatory Visit: Payer: Self-pay | Admitting: Cardiology

## 2016-03-17 ENCOUNTER — Encounter: Payer: Self-pay | Admitting: Internal Medicine

## 2016-03-17 NOTE — Telephone Encounter (Signed)
Rx request sent to pharmacy.  

## 2016-03-18 ENCOUNTER — Other Ambulatory Visit: Payer: Self-pay | Admitting: Internal Medicine

## 2016-03-24 ENCOUNTER — Other Ambulatory Visit: Payer: Self-pay | Admitting: Internal Medicine

## 2016-03-27 ENCOUNTER — Ambulatory Visit (INDEPENDENT_AMBULATORY_CARE_PROVIDER_SITE_OTHER): Payer: Medicare Other | Admitting: Internal Medicine

## 2016-03-27 ENCOUNTER — Encounter: Payer: Self-pay | Admitting: Internal Medicine

## 2016-03-27 VITALS — BP 116/80 | HR 78 | Temp 97.7°F | Resp 16 | Ht 65.0 in | Wt 201.2 lb

## 2016-03-27 DIAGNOSIS — G609 Hereditary and idiopathic neuropathy, unspecified: Secondary | ICD-10-CM

## 2016-03-27 DIAGNOSIS — H269 Unspecified cataract: Secondary | ICD-10-CM | POA: Insufficient documentation

## 2016-03-27 DIAGNOSIS — I10 Essential (primary) hypertension: Secondary | ICD-10-CM

## 2016-03-27 DIAGNOSIS — H25093 Other age-related incipient cataract, bilateral: Secondary | ICD-10-CM | POA: Diagnosis not present

## 2016-03-27 DIAGNOSIS — M0609 Rheumatoid arthritis without rheumatoid factor, multiple sites: Secondary | ICD-10-CM | POA: Diagnosis not present

## 2016-03-27 DIAGNOSIS — R29898 Other symptoms and signs involving the musculoskeletal system: Secondary | ICD-10-CM | POA: Insufficient documentation

## 2016-03-27 DIAGNOSIS — R2681 Unsteadiness on feet: Secondary | ICD-10-CM

## 2016-03-27 NOTE — Progress Notes (Signed)
Pre visit review using our clinic review tool, if applicable. No additional management support is needed unless otherwise documented below in the visit note. 

## 2016-03-27 NOTE — Patient Instructions (Signed)
Hypertension Hypertension, commonly called high blood pressure, is when the force of blood pumping through your arteries is too strong. Your arteries are the blood vessels that carry blood from your heart throughout your body. A blood pressure reading consists of a higher number over a lower number, such as 110/72. The higher number (systolic) is the pressure inside your arteries when your heart pumps. The lower number (diastolic) is the pressure inside your arteries when your heart relaxes. Ideally you want your blood pressure below 120/80. Hypertension forces your heart to work harder to pump blood. Your arteries may become narrow or stiff. Having untreated or uncontrolled hypertension can cause heart attack, stroke, kidney disease, and other problems. What increases the risk? Some risk factors for high blood pressure are controllable. Others are not. Risk factors you cannot control include:  Race. You may be at higher risk if you are African American.  Age. Risk increases with age.  Gender. Men are at higher risk than women before age 45 years. After age 65, women are at higher risk than men. Risk factors you can control include:  Not getting enough exercise or physical activity.  Being overweight.  Getting too much fat, sugar, calories, or salt in your diet.  Drinking too much alcohol. What are the signs or symptoms? Hypertension does not usually cause signs or symptoms. Extremely high blood pressure (hypertensive crisis) may cause headache, anxiety, shortness of breath, and nosebleed. How is this diagnosed? To check if you have hypertension, your health care provider will measure your blood pressure while you are seated, with your arm held at the level of your heart. It should be measured at least twice using the same arm. Certain conditions can cause a difference in blood pressure between your right and left arms. A blood pressure reading that is higher than normal on one occasion does  not mean that you need treatment. If it is not clear whether you have high blood pressure, you may be asked to return on a different day to have your blood pressure checked again. Or, you may be asked to monitor your blood pressure at home for 1 or more weeks. How is this treated? Treating high blood pressure includes making lifestyle changes and possibly taking medicine. Living a healthy lifestyle can help lower high blood pressure. You may need to change some of your habits. Lifestyle changes may include:  Following the DASH diet. This diet is high in fruits, vegetables, and whole grains. It is low in salt, red meat, and added sugars.  Keep your sodium intake below 2,300 mg per day.  Getting at least 30-45 minutes of aerobic exercise at least 4 times per week.  Losing weight if necessary.  Not smoking.  Limiting alcoholic beverages.  Learning ways to reduce stress. Your health care provider may prescribe medicine if lifestyle changes are not enough to get your blood pressure under control, and if one of the following is true:  You are 18-59 years of age and your systolic blood pressure is above 140.  You are 60 years of age or older, and your systolic blood pressure is above 150.  Your diastolic blood pressure is above 90.  You have diabetes, and your systolic blood pressure is over 140 or your diastolic blood pressure is over 90.  You have kidney disease and your blood pressure is above 140/90.  You have heart disease and your blood pressure is above 140/90. Your personal target blood pressure may vary depending on your medical   conditions, your age, and other factors. Follow these instructions at home:  Have your blood pressure rechecked as directed by your health care provider.  Take medicines only as directed by your health care provider. Follow the directions carefully. Blood pressure medicines must be taken as prescribed. The medicine does not work as well when you skip  doses. Skipping doses also puts you at risk for problems.  Do not smoke.  Monitor your blood pressure at home as directed by your health care provider. Contact a health care provider if:  You think you are having a reaction to medicines taken.  You have recurrent headaches or feel dizzy.  You have swelling in your ankles.  You have trouble with your vision. Get help right away if:  You develop a severe headache or confusion.  You have unusual weakness, numbness, or feel faint.  You have severe chest or abdominal pain.  You vomit repeatedly.  You have trouble breathing. This information is not intended to replace advice given to you by your health care provider. Make sure you discuss any questions you have with your health care provider. Document Released: 04/28/2005 Document Revised: 10/04/2015 Document Reviewed: 02/18/2013 Elsevier Interactive Patient Education  2017 [employer]  

## 2016-03-27 NOTE — Progress Notes (Signed)
Subjective:  Patient ID: [name], male    DOB: 1951/02/06  Age: 65 y.o. MRN: [id number]  CC: Back Pain   HPI [name] presents for follow-up on chronic low back pain and worsening lower extremity symptoms. He has had multiple lumbar procedures and says that he has rods in his lower back. Over the last 2 years he has had worsening symptoms in his lower extremities, worse on the left than the right, his legs feel clumsy and numb. He has a hard time lifting up his feet and legs to go up and down stairs. He takes over-the-counter remedies to control the low back pain.  Outpatient Medications Prior to Visit  Medication Sig Dispense Refill  . aspirin EC 81 MG tablet Take 81 mg by mouth daily.    [name] atorvastatin (LIPITOR) 10 MG tablet TAKE 1 TABLET (10 MG TOTAL) BY MOUTH AT BEDTIME. 90 tablet 2  . Certolizumab Pegol (CIMZIA [place]) Inject into the skin. Please confirm dosage strength  Injectable med replaces Remicade use per patient    . ciclopirox (LOPROX) 0.77 % SUSP Apply 1 Act topically 2 (two) times daily. 60 mL 5  . fluticasone (FLONASE) 50 MCG/ACT nasal spray Place 2 sprays into both nostrils daily. 15 g 2  . furosemide (LASIX) 20 MG tablet TAKE 1 TABLET (20 MG TOTAL) BY MOUTH 2 (TWO) TIMES DAILY. 60 tablet 5  . GENVOYA 150-150-200-10 MG TABS tablet TAKE 1 TABLET BY MOUTH DAILY WITH BREAKFAST. 30 tablet 5  . loratadine (CLARITIN) 10 MG tablet TAKE 1 TABLET (10 MG TOTAL) BY MOUTH DAILY. 90 tablet 3  . LYRICA 200 MG capsule TAKE 1 CAPSULE BY MOUTH TWICE A DAY 60 capsule 1  . metoprolol succinate (TOPROL-XL) 25 MG 24 hr tablet Take 1 tablet (25 mg total) by mouth daily. Please schedule appointment for refills. 30 tablet 6  . pantoprazole (PROTONIX) 40 MG tablet Take 1 tablet (40 mg total) by mouth 2 (two) times daily before a meal. (Patient taking differently: Take 40 mg by mouth every evening. ) 60 tablet 3  . predniSONE (DELTASONE) 5 MG tablet Take 5 mg by mouth daily with breakfast.      . PREZISTA 800 MG tablet TAKE 1 TABLET (800 MG TOTAL) BY MOUTH DAILY. 30 tablet 5  . saccharomyces boulardii (FLORASTOR) 250 MG capsule Take 1 capsule (250 mg total) by mouth 2 (two) times daily. 60 capsule 1  . sulfamethoxazole-trimethoprim (BACTRIM DS,SEPTRA DS) 800-160 MG tablet TAKE 1 TABLET BY MOUTH DAILY. 30 tablet 3  . valACYclovir (VALTREX) 500 MG tablet TAKE 1 TABLET BY MOUTH EVERY DAY 90 tablet 3  . zolpidem (AMBIEN) 5 MG tablet TAKE 1 TABLET BY MOUTH AT BEDTIME AS NEEDED FOR SLEEP 30 tablet 2  . azithromycin (ZITHROMAX) 250 MG tablet Take 1 tablet (250 mg total) by mouth daily. Take first 2 tablets together, then 1 every day until finished. 6 tablet 1  . SAVAYSA 30 MG TABS tablet TAKE 1 TABLET (30 MG TOTAL) BY MOUTH DAILY. 30 tablet 3  . sulfamethoxazole-trimethoprim (BACTRIM DS,SEPTRA DS) 800-160 MG tablet TAKE 1 TABLET BY MOUTH EVERY 12 (TWELVE) HOURS. 28 tablet 3   No facility-administered medications prior to visit.     ROS Review of Systems  Constitutional: Negative for activity change, appetite change, chills, fatigue and fever.  HENT: Negative.   Eyes: Negative for visual disturbance.  Respiratory: Positive for shortness of breath. Negative for cough, choking, chest tightness and stridor.   Cardiovascular:  Negative for chest pain, palpitations and leg swelling.  Gastrointestinal: Negative for abdominal pain, constipation, diarrhea, nausea and vomiting.  Endocrine: Negative.   Genitourinary: Negative.  Negative for difficulty urinating.  Musculoskeletal: Positive for back pain. Negative for arthralgias, joint swelling, myalgias and neck pain.  Skin: Negative for color change, pallor, rash and wound.  Allergic/Immunologic: Negative.   Neurological: Positive for weakness and numbness. Negative for dizziness, tremors, seizures, syncope, facial asymmetry, speech difficulty, light-headedness and headaches.  Hematological: Negative.  Negative for adenopathy. Does not  bruise/bleed easily.  Psychiatric/Behavioral: Negative.     Objective:  BP 116/80 (BP Location: Left Arm, Patient Position: Sitting, Cuff Size: Normal)   Pulse 78   Temp 97.7 F (36.5 C) (Oral)   Ht 5\' 5"  (1.651 m)   Wt 201 lb 4 oz (91.3 kg)   SpO2 96%   BMI 33.49 kg/m   BP Readings from Last 3 Encounters:  03/27/16 116/80  03/03/16 132/68  02/18/16 120/77    Wt Readings from Last 3 Encounters:  03/27/16 201 lb 4 oz (91.3 kg)  02/18/16 193 lb (87.5 kg)  02/07/16 190 lb (86.2 kg)    Physical Exam  Constitutional: He is oriented to person, place, and time. No distress.  HENT:  Mouth/Throat: Oropharynx is clear and moist. No oropharyngeal exudate.  Eyes: Conjunctivae are normal. Right eye exhibits no discharge. Left eye exhibits no discharge. No scleral icterus.  Neck: Normal range of motion. Neck supple. No JVD present. No tracheal deviation present. No thyromegaly present.  Cardiovascular: Normal rate, regular rhythm and intact distal pulses.  Exam reveals no friction rub.   Murmur heard.  Systolic murmur is present with a grade of 1/6  1/6 SEM heard everywhere but louder over LLSB  Pulmonary/Chest: Effort normal and breath sounds normal. No stridor. No respiratory distress. He has no wheezes. He has no rales. He exhibits no tenderness.  Abdominal: Soft. Bowel sounds are normal. He exhibits no distension and no mass. There is no tenderness. There is no rebound and no guarding.  Musculoskeletal: He exhibits no edema or tenderness.       Lumbar back: He exhibits decreased range of motion and deformity. He exhibits no tenderness, no bony tenderness, no swelling and no pain.  Lumbar spine is fixed at about 75 degree flexion  Lymphadenopathy:    He has no cervical adenopathy.  Neurological: He is oriented to person, place, and time. He displays atrophy. He displays no tremor. A sensory deficit is present. No cranial nerve deficit. He exhibits abnormal muscle tone. He displays a  negative Romberg sign. He displays no seizure activity. Coordination and gait abnormal.  Gait is ataxic Both legs are weak and atrophied  Skin: Skin is warm and dry. No rash noted. He is not diaphoretic. No erythema. No pallor.    Lab Results  Component Value Date   WBC 16.9 (H) 02/18/2016   HGB 12.7 (L) 02/18/2016   HCT 39.7 02/18/2016   PLT 186 02/18/2016   GLUCOSE 111 (H) 02/18/2016   CHOL 149 08/30/2015   TRIG 151 (H) 08/30/2015   HDL 29 (L) 08/30/2015   LDLCALC 90 08/30/2015   ALT 24 02/18/2016   AST 26 02/18/2016   NA 142 02/18/2016   K 4.0 02/18/2016   CL 104 02/18/2016   CREATININE 1.08 02/18/2016   BUN 22 02/18/2016   CO2 26 02/18/2016   TSH 0.60 07/11/2014   INR 2.92 (H) 07/13/2015   HGBA1C 5.2 12/18/2015    No  results found.  Assessment & Plan:   [name] was seen today for back pain.  Diagnoses and all orders for this visit:  Leg weakness, bilateral- will get a NCS/EMG to identify the causes and severity of his LE signs and symptoms -     Ambulatory referral to Physical Therapy -     Ambulatory referral to Neurology  Gait instability- will start PT to improve function -     Ambulatory referral to Physical Therapy -     Ambulatory referral to Neurology  Other age-related incipient cataract of both eyes -     Ambulatory referral to Ophthalmology  Essential hypertension- his BP is well controlled  Hereditary and idiopathic peripheral neuropathy- as above   I have discontinued Mr. Narvaiz's azithromycin. I am also having him maintain his aspirin EC, saccharomyces boulardii, zolpidem, furosemide, atorvastatin, PREZISTA, sulfamethoxazole-trimethoprim, GENVOYA, ciclopirox, pantoprazole, LYRICA, predniSONE, Certolizumab Pegol (CIMZIA [place]), fluticasone, metoprolol succinate, loratadine, and valACYclovir.  No orders of the defined types were placed in this encounter.    Follow-up: Return in about 3 months (around 06/27/2016).  [name], MD

## 2016-03-29 ENCOUNTER — Other Ambulatory Visit: Payer: Self-pay | Admitting: Internal Medicine

## 2016-03-29 DIAGNOSIS — I82409 Acute embolism and thrombosis of unspecified deep veins of unspecified lower extremity: Secondary | ICD-10-CM

## 2016-03-31 ENCOUNTER — Encounter: Payer: Self-pay | Admitting: Internal Medicine

## 2016-04-08 ENCOUNTER — Encounter: Payer: Self-pay | Admitting: Internal Medicine

## 2016-04-08 ENCOUNTER — Other Ambulatory Visit: Payer: Self-pay | Admitting: Internal Medicine

## 2016-04-08 DIAGNOSIS — F3341 Major depressive disorder, recurrent, in partial remission: Secondary | ICD-10-CM

## 2016-04-08 MED ORDER — ESCITALOPRAM OXALATE 10 MG PO TABS: 10.0000 mg | ORAL_TABLET | Freq: Every day | ORAL | 3 refills | Status: DC

## 2016-04-11 ENCOUNTER — Other Ambulatory Visit: Payer: Self-pay | Admitting: *Deleted

## 2016-04-11 DIAGNOSIS — R200 Anesthesia of skin: Secondary | ICD-10-CM

## 2016-04-13 ENCOUNTER — Other Ambulatory Visit: Payer: Self-pay | Admitting: Infectious Diseases

## 2016-04-13 DIAGNOSIS — M792 Neuralgia and neuritis, unspecified: Secondary | ICD-10-CM

## 2016-04-16 ENCOUNTER — Other Ambulatory Visit: Payer: Self-pay | Admitting: Infectious Diseases

## 2016-04-16 DIAGNOSIS — M792 Neuralgia and neuritis, unspecified: Secondary | ICD-10-CM

## 2016-04-16 MED ORDER — PREGABALIN 200 MG PO CAPS: 200.0000 mg | ORAL_CAPSULE | Freq: Two times a day (BID) | ORAL | 1 refills | Status: DC

## 2016-04-16 NOTE — Addendum Note (Signed)
Addended by: Lorne Skeens D on: 04/16/2016 03:12 PM   Modules accepted: Orders

## 2016-04-24 DIAGNOSIS — M0609 Rheumatoid arthritis without rheumatoid factor, multiple sites: Secondary | ICD-10-CM | POA: Diagnosis not present

## 2016-04-28 ENCOUNTER — Other Ambulatory Visit: Payer: Self-pay | Admitting: Internal Medicine

## 2016-04-29 DIAGNOSIS — R29898 Other symptoms and signs involving the musculoskeletal system: Secondary | ICD-10-CM | POA: Diagnosis not present

## 2016-04-29 DIAGNOSIS — R2681 Unsteadiness on feet: Secondary | ICD-10-CM | POA: Diagnosis not present

## 2016-04-29 DIAGNOSIS — M6281 Muscle weakness (generalized): Secondary | ICD-10-CM | POA: Diagnosis not present

## 2016-05-02 ENCOUNTER — Ambulatory Visit: Payer: Self-pay | Admitting: Internal Medicine

## 2016-05-02 DIAGNOSIS — M6281 Muscle weakness (generalized): Secondary | ICD-10-CM | POA: Diagnosis not present

## 2016-05-02 DIAGNOSIS — R29898 Other symptoms and signs involving the musculoskeletal system: Secondary | ICD-10-CM | POA: Diagnosis not present

## 2016-05-02 DIAGNOSIS — R2681 Unsteadiness on feet: Secondary | ICD-10-CM | POA: Diagnosis not present

## 2016-05-07 ENCOUNTER — Telehealth: Payer: Self-pay | Admitting: Internal Medicine

## 2016-05-07 NOTE — Telephone Encounter (Signed)
Pt called in and said that he went to PT this morning and he bp went up to 157/91 and he went limp and did not feel good.  Can you call him asap?

## 2016-05-08 NOTE — Telephone Encounter (Signed)
Spoke with pt, pt stated he feel a lot better since yesterday: BP 157/90,152/89, 123/70 and blood sugar 85. He has an appt with Dr. Ronnald Ramp on 05/14/16 for more eval.

## 2016-05-08 NOTE — Telephone Encounter (Signed)
LVM for pt to call back as soon as possible.   

## 2016-05-13 ENCOUNTER — Emergency Department (HOSPITAL_BASED_OUTPATIENT_CLINIC_OR_DEPARTMENT_OTHER): Admit: 2016-05-13 | Discharge: 2016-05-13 | Disposition: A | Discharge status: Home / Self Care | Payer: Medicare Other

## 2016-05-13 ENCOUNTER — Inpatient Hospital Stay (HOSPITAL_COMMUNITY)
Admission: EM | Admit: 2016-05-13 | Discharge: 2016-05-24 | DRG: 551 | Disposition: A | Discharge status: Skilled Nursing Facility | Payer: Medicare Other | Attending: Internal Medicine | Admitting: Internal Medicine

## 2016-05-13 ENCOUNTER — Emergency Department (HOSPITAL_COMMUNITY): Payer: Medicare Other

## 2016-05-13 ENCOUNTER — Encounter (HOSPITAL_COMMUNITY): Payer: Self-pay

## 2016-05-13 DIAGNOSIS — B20 Human immunodeficiency virus [HIV] disease: Secondary | ICD-10-CM | POA: Diagnosis not present

## 2016-05-13 DIAGNOSIS — C9110 Chronic lymphocytic leukemia of B-cell type not having achieved remission: Secondary | ICD-10-CM | POA: Diagnosis not present

## 2016-05-13 DIAGNOSIS — M6281 Muscle weakness (generalized): Secondary | ICD-10-CM | POA: Diagnosis not present

## 2016-05-13 DIAGNOSIS — Z79899 Other long term (current) drug therapy: Secondary | ICD-10-CM

## 2016-05-13 DIAGNOSIS — N433 Hydrocele, unspecified: Secondary | ICD-10-CM | POA: Diagnosis present

## 2016-05-13 DIAGNOSIS — I10 Essential (primary) hypertension: Secondary | ICD-10-CM | POA: Diagnosis present

## 2016-05-13 DIAGNOSIS — M109 Gout, unspecified: Secondary | ICD-10-CM | POA: Diagnosis present

## 2016-05-13 DIAGNOSIS — N5089 Other specified disorders of the male genital organs: Secondary | ICD-10-CM

## 2016-05-13 DIAGNOSIS — C9192 Lymphoid leukemia, unspecified, in relapse: Secondary | ICD-10-CM | POA: Diagnosis present

## 2016-05-13 DIAGNOSIS — L03116 Cellulitis of left lower limb: Secondary | ICD-10-CM | POA: Diagnosis not present

## 2016-05-13 DIAGNOSIS — M549 Dorsalgia, unspecified: Secondary | ICD-10-CM | POA: Diagnosis present

## 2016-05-13 DIAGNOSIS — N451 Epididymitis: Secondary | ICD-10-CM | POA: Diagnosis present

## 2016-05-13 DIAGNOSIS — I25708 Atherosclerosis of coronary artery bypass graft(s), unspecified, with other forms of angina pectoris: Secondary | ICD-10-CM

## 2016-05-13 DIAGNOSIS — R079 Chest pain, unspecified: Secondary | ICD-10-CM

## 2016-05-13 DIAGNOSIS — M797 Fibromyalgia: Secondary | ICD-10-CM | POA: Diagnosis present

## 2016-05-13 DIAGNOSIS — Z7982 Long term (current) use of aspirin: Secondary | ICD-10-CM

## 2016-05-13 DIAGNOSIS — Z951 Presence of aortocoronary bypass graft: Secondary | ICD-10-CM

## 2016-05-13 DIAGNOSIS — I25810 Atherosclerosis of coronary artery bypass graft(s) without angina pectoris: Secondary | ICD-10-CM | POA: Diagnosis present

## 2016-05-13 DIAGNOSIS — I2510 Atherosclerotic heart disease of native coronary artery without angina pectoris: Secondary | ICD-10-CM | POA: Diagnosis present

## 2016-05-13 DIAGNOSIS — R109 Unspecified abdominal pain: Secondary | ICD-10-CM

## 2016-05-13 DIAGNOSIS — Z8673 Personal history of transient ischemic attack (TIA), and cerebral infarction without residual deficits: Secondary | ICD-10-CM

## 2016-05-13 DIAGNOSIS — M79609 Pain in unspecified limb: Secondary | ICD-10-CM | POA: Diagnosis not present

## 2016-05-13 DIAGNOSIS — M5440 Lumbago with sciatica, unspecified side: Secondary | ICD-10-CM | POA: Diagnosis not present

## 2016-05-13 DIAGNOSIS — M5442 Lumbago with sciatica, left side: Secondary | ICD-10-CM

## 2016-05-13 DIAGNOSIS — K567 Ileus, unspecified: Secondary | ICD-10-CM | POA: Diagnosis not present

## 2016-05-13 DIAGNOSIS — R531 Weakness: Secondary | ICD-10-CM

## 2016-05-13 DIAGNOSIS — E669 Obesity, unspecified: Secondary | ICD-10-CM | POA: Diagnosis present

## 2016-05-13 DIAGNOSIS — B59 Pneumocystosis: Secondary | ICD-10-CM

## 2016-05-13 DIAGNOSIS — M545 Low back pain: Secondary | ICD-10-CM | POA: Diagnosis not present

## 2016-05-13 DIAGNOSIS — Z96653 Presence of artificial knee joint, bilateral: Secondary | ICD-10-CM | POA: Diagnosis present

## 2016-05-13 DIAGNOSIS — R52 Pain, unspecified: Secondary | ICD-10-CM | POA: Diagnosis present

## 2016-05-13 DIAGNOSIS — R1032 Left lower quadrant pain: Secondary | ICD-10-CM | POA: Diagnosis not present

## 2016-05-13 DIAGNOSIS — IMO0001 Reserved for inherently not codable concepts without codable children: Secondary | ICD-10-CM | POA: Diagnosis present

## 2016-05-13 DIAGNOSIS — I252 Old myocardial infarction: Secondary | ICD-10-CM

## 2016-05-13 DIAGNOSIS — G629 Polyneuropathy, unspecified: Secondary | ICD-10-CM | POA: Diagnosis present

## 2016-05-13 DIAGNOSIS — S7002XA Contusion of left hip, initial encounter: Secondary | ICD-10-CM

## 2016-05-13 DIAGNOSIS — Z21 Asymptomatic human immunodeficiency virus [HIV] infection status: Secondary | ICD-10-CM | POA: Diagnosis present

## 2016-05-13 DIAGNOSIS — F329 Major depressive disorder, single episode, unspecified: Secondary | ICD-10-CM | POA: Diagnosis present

## 2016-05-13 DIAGNOSIS — K219 Gastro-esophageal reflux disease without esophagitis: Secondary | ICD-10-CM | POA: Diagnosis present

## 2016-05-13 DIAGNOSIS — Z880 Allergy status to penicillin: Secondary | ICD-10-CM

## 2016-05-13 DIAGNOSIS — Z955 Presence of coronary angioplasty implant and graft: Secondary | ICD-10-CM

## 2016-05-13 DIAGNOSIS — D696 Thrombocytopenia, unspecified: Secondary | ICD-10-CM | POA: Diagnosis present

## 2016-05-13 DIAGNOSIS — G8929 Other chronic pain: Secondary | ICD-10-CM | POA: Diagnosis present

## 2016-05-13 DIAGNOSIS — D649 Anemia, unspecified: Secondary | ICD-10-CM | POA: Diagnosis present

## 2016-05-13 DIAGNOSIS — I82409 Acute embolism and thrombosis of unspecified deep veins of unspecified lower extremity: Secondary | ICD-10-CM | POA: Diagnosis present

## 2016-05-13 DIAGNOSIS — F32A Depression, unspecified: Secondary | ICD-10-CM | POA: Diagnosis present

## 2016-05-13 DIAGNOSIS — R590 Localized enlarged lymph nodes: Secondary | ICD-10-CM | POA: Diagnosis present

## 2016-05-13 DIAGNOSIS — Z981 Arthrodesis status: Secondary | ICD-10-CM

## 2016-05-13 DIAGNOSIS — R1030 Lower abdominal pain, unspecified: Secondary | ICD-10-CM | POA: Diagnosis not present

## 2016-05-13 DIAGNOSIS — J189 Pneumonia, unspecified organism: Secondary | ICD-10-CM | POA: Diagnosis not present

## 2016-05-13 DIAGNOSIS — E876 Hypokalemia: Secondary | ICD-10-CM | POA: Diagnosis not present

## 2016-05-13 DIAGNOSIS — E871 Hypo-osmolality and hyponatremia: Secondary | ICD-10-CM | POA: Diagnosis not present

## 2016-05-13 DIAGNOSIS — M069 Rheumatoid arthritis, unspecified: Secondary | ICD-10-CM | POA: Diagnosis present

## 2016-05-13 DIAGNOSIS — R262 Difficulty in walking, not elsewhere classified: Secondary | ICD-10-CM

## 2016-05-13 DIAGNOSIS — C9112 Chronic lymphocytic leukemia of B-cell type in relapse: Secondary | ICD-10-CM | POA: Diagnosis present

## 2016-05-13 DIAGNOSIS — R911 Solitary pulmonary nodule: Secondary | ICD-10-CM | POA: Diagnosis present

## 2016-05-13 DIAGNOSIS — R140 Abdominal distension (gaseous): Secondary | ICD-10-CM

## 2016-05-13 DIAGNOSIS — M5134 Other intervertebral disc degeneration, thoracic region: Secondary | ICD-10-CM | POA: Diagnosis present

## 2016-05-13 DIAGNOSIS — Z09 Encounter for follow-up examination after completed treatment for conditions other than malignant neoplasm: Secondary | ICD-10-CM

## 2016-05-13 DIAGNOSIS — Z6833 Body mass index (BMI) 33.0-33.9, adult: Secondary | ICD-10-CM

## 2016-05-13 DIAGNOSIS — R29898 Other symptoms and signs involving the musculoskeletal system: Secondary | ICD-10-CM | POA: Diagnosis present

## 2016-05-13 DIAGNOSIS — X58XXXA Exposure to other specified factors, initial encounter: Secondary | ICD-10-CM | POA: Diagnosis present

## 2016-05-13 DIAGNOSIS — Z86718 Personal history of other venous thrombosis and embolism: Secondary | ICD-10-CM

## 2016-05-13 LAB — COMPREHENSIVE METABOLIC PANEL
ALT: 29 U/L (ref 17–63)
AST: 36 U/L (ref 15–41)
Albumin: 3.1 g/dL — ABNORMAL LOW (ref 3.5–5.0)
Alkaline Phosphatase: 51 U/L (ref 38–126)
Anion gap: 7 (ref 5–15)
BUN: 15 mg/dL (ref 6–20)
CO2: 27 mmol/L (ref 22–32)
Calcium: 8.4 mg/dL — ABNORMAL LOW (ref 8.9–10.3)
Chloride: 101 mmol/L (ref 101–111)
Creatinine, Ser: 0.94 mg/dL (ref 0.61–1.24)
GFR calc Af Amer: >60 mL/min (ref >60)
GFR calc non Af Amer: >60 mL/min (ref >60)
Glucose, Bld: 86 mg/dL (ref 65–99)
Potassium: 3.3 mmol/L — ABNORMAL LOW (ref 3.5–5.1)
Sodium: 135 mmol/L (ref 135–145)
Total Bilirubin: 0.9 mg/dL (ref 0.3–1.2)
Total Protein: 5.3 g/dL — ABNORMAL LOW (ref 6.5–8.1)

## 2016-05-13 LAB — CBC WITH DIFFERENTIAL/PLATELET
Band Neutrophils: 0 %
Basophils Absolute: 0 10*3/uL (ref 0.0–0.1)
Basophils Relative: 0 %
Blasts: 0 %
Eosinophils Absolute: 0 10*3/uL (ref 0.0–0.7)
Eosinophils Relative: 0 %
HCT: 37.4 % — ABNORMAL LOW (ref 39.0–52.0)
Hemoglobin: 12.2 g/dL — ABNORMAL LOW (ref 13.0–17.0)
Lymphocytes Relative: 49 %
Lymphs Abs: 5.1 10*3/uL — ABNORMAL HIGH (ref 0.7–4.0)
MCH: 28.1 pg (ref 26.0–34.0)
MCHC: 32.6 g/dL (ref 30.0–36.0)
MCV: 86.2 fL (ref 78.0–100.0)
Metamyelocytes Relative: 0 %
Monocytes Absolute: 1.6 10*3/uL — ABNORMAL HIGH (ref 0.1–1.0)
Monocytes Relative: 16 %
Myelocytes: 0 %
Neutro Abs: 3.6 10*3/uL (ref 1.7–7.7)
Neutrophils Relative %: 35 %
Other: 0 %
Platelets: 117 10*3/uL — ABNORMAL LOW (ref 150–400)
Promyelocytes Absolute: 0 %
RBC: 4.34 MIL/uL (ref 4.22–5.81)
RDW: 15.8 % — ABNORMAL HIGH (ref 11.5–15.5)
WBC: 10.3 10*3/uL (ref 4.0–10.5)
nRBC: 0 /100 WBC

## 2016-05-13 LAB — URINALYSIS, ROUTINE W REFLEX MICROSCOPIC
Bilirubin Urine: NEGATIVE
Glucose, UA: NEGATIVE mg/dL
Hgb urine dipstick: NEGATIVE
Ketones, ur: 20 mg/dL — AB
Leukocytes, UA: NEGATIVE
Nitrite: NEGATIVE
Protein, ur: 30 mg/dL — AB
Specific Gravity, Urine: >1.046 — ABNORMAL HIGH (ref 1.005–1.030)
Squamous Epithelial / LPF: NONE SEEN
pH: 5 (ref 5.0–8.0)

## 2016-05-13 LAB — LIPASE, BLOOD: Lipase: 12 U/L (ref 11–51)

## 2016-05-13 LAB — I-STAT CG4 LACTIC ACID, ED: Lactic Acid, Venous: 1.28 mmol/L (ref 0.5–1.9)

## 2016-05-13 MED ORDER — ESCITALOPRAM OXALATE 10 MG PO TABS
10.0000 mg | ORAL_TABLET | Freq: Every day | ORAL | Status: DC
  Administered 2016-05-14 – 2016-05-24 (×11): 10 mg via ORAL
  Filled 2016-05-13 (×11): qty 1

## 2016-05-13 MED ORDER — CLINDAMYCIN HCL 300 MG PO CAPS
300.0000 mg | ORAL_CAPSULE | Freq: Four times a day (QID) | ORAL | Status: AC
  Administered 2016-05-14 – 2016-05-18 (×19): 300 mg via ORAL
  Filled 2016-05-13 (×18): qty 1

## 2016-05-13 MED ORDER — ACETAMINOPHEN 325 MG PO TABS: 650.0000 mg | ORAL_TABLET | Freq: Four times a day (QID) | ORAL | Status: DC | PRN

## 2016-05-13 MED ORDER — HYDROMORPHONE HCL 2 MG/ML IJ SOLN
0.5000 mg | Freq: Once | INTRAMUSCULAR | Status: AC
  Administered 2016-05-13: 0.5 mg via INTRAVENOUS
  Filled 2016-05-13: qty 1

## 2016-05-13 MED ORDER — BISACODYL 5 MG PO TBEC
5.0000 mg | DELAYED_RELEASE_TABLET | Freq: Every day | ORAL | Status: DC | PRN
  Administered 2016-05-14: 5 mg via ORAL
  Filled 2016-05-13: qty 1

## 2016-05-13 MED ORDER — SODIUM CHLORIDE 0.9% FLUSH
3.0000 mL | INTRAVENOUS | Status: DC | PRN
  Administered 2016-05-24: 3 mL via INTRAVENOUS
  Filled 2016-05-13: qty 3

## 2016-05-13 MED ORDER — SACCHAROMYCES BOULARDII 250 MG PO CAPS
250.0000 mg | ORAL_CAPSULE | Freq: Two times a day (BID) | ORAL | Status: DC
  Administered 2016-05-14 – 2016-05-24 (×22): 250 mg via ORAL
  Filled 2016-05-13 (×22): qty 1

## 2016-05-13 MED ORDER — ONDANSETRON HCL 4 MG/2ML IJ SOLN
4.0000 mg | Freq: Once | INTRAMUSCULAR | Status: AC
  Administered 2016-05-13: 4 mg via INTRAVENOUS
  Filled 2016-05-13: qty 2

## 2016-05-13 MED ORDER — DARUNAVIR ETHANOLATE 800 MG PO TABS
800.0000 mg | ORAL_TABLET | Freq: Every day | ORAL | Status: DC
  Administered 2016-05-14 – 2016-05-24 (×11): 800 mg via ORAL
  Filled 2016-05-13 (×11): qty 1

## 2016-05-13 MED ORDER — POTASSIUM CHLORIDE CRYS ER 20 MEQ PO TBCR
40.0000 meq | EXTENDED_RELEASE_TABLET | Freq: Once | ORAL | Status: AC
  Administered 2016-05-14: 40 meq via ORAL
  Filled 2016-05-13: qty 2

## 2016-05-13 MED ORDER — ACETAMINOPHEN 650 MG RE SUPP: 650.0000 mg | Freq: Four times a day (QID) | RECTAL | Status: DC | PRN

## 2016-05-13 MED ORDER — SODIUM CHLORIDE 0.9 % IV BOLUS (SEPSIS)
1000.0000 mL | Freq: Once | INTRAVENOUS | Status: AC
  Administered 2016-05-13: 1000 mL via INTRAVENOUS

## 2016-05-13 MED ORDER — PANTOPRAZOLE SODIUM 40 MG PO TBEC
40.0000 mg | DELAYED_RELEASE_TABLET | Freq: Two times a day (BID) | ORAL | Status: DC
  Administered 2016-05-14 – 2016-05-24 (×21): 40 mg via ORAL
  Filled 2016-05-13 (×20): qty 1

## 2016-05-13 MED ORDER — ONDANSETRON HCL 4 MG PO TABS: 4.0000 mg | ORAL_TABLET | Freq: Four times a day (QID) | ORAL | Status: DC | PRN

## 2016-05-13 MED ORDER — EDOXABAN TOSYLATE 30 MG PO TABS
30.0000 mg | ORAL_TABLET | Freq: Every day | ORAL | Status: DC
  Administered 2016-05-14 – 2016-05-21 (×8): 30 mg via ORAL
  Filled 2016-05-13 (×9): qty 1

## 2016-05-13 MED ORDER — LORATADINE 10 MG PO TABS
10.0000 mg | ORAL_TABLET | Freq: Every day | ORAL | Status: DC
  Administered 2016-05-14 – 2016-05-24 (×11): 10 mg via ORAL
  Filled 2016-05-13 (×11): qty 1

## 2016-05-13 MED ORDER — SODIUM CHLORIDE 0.9% FLUSH
3.0000 mL | Freq: Two times a day (BID) | INTRAVENOUS | Status: DC
  Administered 2016-05-14 – 2016-05-24 (×16): 3 mL via INTRAVENOUS

## 2016-05-13 MED ORDER — VALACYCLOVIR HCL 500 MG PO TABS
500.0000 mg | ORAL_TABLET | Freq: Every day | ORAL | Status: DC
  Administered 2016-05-14 – 2016-05-24 (×11): 500 mg via ORAL
  Filled 2016-05-13 (×11): qty 1

## 2016-05-13 MED ORDER — ZOLPIDEM TARTRATE 5 MG PO TABS
5.0000 mg | ORAL_TABLET | Freq: Every evening | ORAL | Status: DC | PRN
Start: 2016-05-13 — End: 2016-05-24

## 2016-05-13 MED ORDER — ELVITEG-COBIC-EMTRICIT-TENOFAF 150-150-200-10 MG PO TABS
1.0000 | ORAL_TABLET | Freq: Every day | ORAL | Status: DC
  Administered 2016-05-14 – 2016-05-24 (×11): 1 via ORAL
  Filled 2016-05-13 (×11): qty 1

## 2016-05-13 MED ORDER — SODIUM CHLORIDE 0.9 % IV SOLN: 250.0000 mL | INTRAVENOUS | Status: DC | PRN

## 2016-05-13 MED ORDER — IOPAMIDOL (ISOVUE-300) INJECTION 61%
INTRAVENOUS | Status: AC
  Administered 2016-05-13: 100 mL via INTRAVENOUS
  Filled 2016-05-13: qty 100

## 2016-05-13 MED ORDER — FENTANYL CITRATE (PF) 100 MCG/2ML IJ SOLN
50.0000 ug | Freq: Once | INTRAMUSCULAR | Status: AC
  Administered 2016-05-13: 50 ug via INTRAVENOUS
  Filled 2016-05-13: qty 2

## 2016-05-13 MED ORDER — PREGABALIN 100 MG PO CAPS
200.0000 mg | ORAL_CAPSULE | Freq: Two times a day (BID) | ORAL | Status: DC
  Administered 2016-05-14 – 2016-05-24 (×22): 200 mg via ORAL
  Filled 2016-05-13 (×10): qty 2
  Filled 2016-05-13: qty 4
  Filled 2016-05-13 (×5): qty 2
  Filled 2016-05-13: qty 4
  Filled 2016-05-13 (×5): qty 2

## 2016-05-13 MED ORDER — HYDROMORPHONE HCL 2 MG PO TABS
2.0000 mg | ORAL_TABLET | ORAL | Status: DC | PRN
  Administered 2016-05-14 – 2016-05-15 (×4): 2 mg via ORAL
  Filled 2016-05-13 (×4): qty 1

## 2016-05-13 MED ORDER — PREDNISONE 5 MG PO TABS
5.0000 mg | ORAL_TABLET | Freq: Every day | ORAL | Status: DC
  Administered 2016-05-14 – 2016-05-24 (×11): 5 mg via ORAL
  Filled 2016-05-13 (×10): qty 1

## 2016-05-13 MED ORDER — ASPIRIN EC 81 MG PO TBEC
81.0000 mg | DELAYED_RELEASE_TABLET | Freq: Every day | ORAL | Status: DC
  Administered 2016-05-14 – 2016-05-24 (×11): 81 mg via ORAL
  Filled 2016-05-13 (×11): qty 1

## 2016-05-13 MED ORDER — ATORVASTATIN CALCIUM 10 MG PO TABS
10.0000 mg | ORAL_TABLET | Freq: Every day | ORAL | Status: DC
  Administered 2016-05-14 – 2016-05-23 (×11): 10 mg via ORAL
  Filled 2016-05-13 (×11): qty 1

## 2016-05-13 MED ORDER — METOPROLOL SUCCINATE ER 25 MG PO TB24
25.0000 mg | ORAL_TABLET | Freq: Every day | ORAL | Status: DC
  Administered 2016-05-14 – 2016-05-24 (×11): 25 mg via ORAL
  Filled 2016-05-13 (×11): qty 1

## 2016-05-13 MED ORDER — ONDANSETRON HCL 4 MG/2ML IJ SOLN
4.0000 mg | Freq: Four times a day (QID) | INTRAMUSCULAR | Status: DC | PRN
  Administered 2016-05-20: 4 mg via INTRAVENOUS
  Filled 2016-05-13: qty 2

## 2016-05-13 MED ORDER — KETOROLAC TROMETHAMINE 15 MG/ML IJ SOLN
15.0000 mg | Freq: Four times a day (QID) | INTRAMUSCULAR | Status: AC
  Administered 2016-05-14 – 2016-05-18 (×18): 15 mg via INTRAVENOUS
  Filled 2016-05-13 (×17): qty 1

## 2016-05-13 MED ORDER — POLYETHYLENE GLYCOL 3350 17 G PO PACK
17.0000 g | PACK | Freq: Every day | ORAL | Status: DC | PRN
  Administered 2016-05-14 – 2016-05-15 (×2): 17 g via ORAL
  Filled 2016-05-13 (×3): qty 1

## 2016-05-13 MED ORDER — SULFAMETHOXAZOLE-TRIMETHOPRIM 800-160 MG PO TABS
1.0000 | ORAL_TABLET | Freq: Every day | ORAL | Status: DC
  Administered 2016-05-14 – 2016-05-20 (×7): 1 via ORAL
  Filled 2016-05-13 (×7): qty 1

## 2016-05-13 NOTE — Progress Notes (Signed)
**  Preliminary report by tech**  Left lower extremity venous duplex complete. There is no evidence of deep or superficial vein thrombosis involving the left lower extremity. All clearly visualized vessels appear patent and compressible. There is no evidence of a Baker's cyst on the left. Incidental findings of multiple heterogenous areas of the left groin suggestive of prominent inguinal lymph nodes, the largest of which measures 0.84 cm x 2.29 cm.  05/13/16 4:53 PM [name] RVT

## 2016-05-13 NOTE — ED Notes (Signed)
MRI not available to get pt before going upstairs.

## 2016-05-13 NOTE — H&P (Addendum)
History and Physical    [name] [id number] DOB: [birth year] DOA: 05/13/2016  PCP: [name], MD   Patient coming from: Home  Chief Complaint: Severe low back pain   HPI: [name] is a 66 y.o. male with medical history significant for HIV, CLL, CAD status post CABG, rheumatoid arthritis, and degenerative disc disease with chronic low back pain status post posterior fusion, presenting to the emergency department with acute worsening of his chronic low back pain. Patient reports that he was in his usual state of health until 05/09/2016 when he experienced insidious worsening in his chronic low back pain. There was no precipitating event that he could recall, no fall or trauma, and no heavy lifting or increased exertion. Pain is described as severe, sharp, starting in the low back with radiation down the left leg, worse with any movement, and with no alleviating factors identified. Patient denies any fevers or chills associated with this and denies any recent weight loss. Patient reports some chronic bilateral lower extremity weakness which has been documented in the chart previously. He denies any incontinence or paresthesias in the saddle region. Patient reports that he has been bed bound for the past few days secondary to his pain.  ED Course: Upon arrival to the ED, patient is found to be afebrile, saturating well on room air, and with vitals otherwise stable. CT of the lumbar spine is negative for acute findings, mass, infectious process, or significant spinal stenosis, but notable for unchanged chronic degeneration and postsurgical findings. Patient also complained of left lower quadrant pain and CT the abdomen and pelvis is negative for acute finding, but notable for mild increase in his bulky adenopathy in the retroperitoneum and pelvis, likely secondary to CLL. Chemistry panels notable for a potassium of 3.3 and CBC features a stable normocytic anemia with hemoglobin 12.2, and  stable thrombocyte pia with platelets 117,000. Lower extremity ultrasound was obtained and negative for DVT, but notable for prominent left inguinal lymph nodes. Patient was given 1 L of normal saline, fentanyl, Dilaudid, and Zofran in the emergency department. Unfortunately, he had minimal relief in his pain and was unable to stand secondary to severe pain. MRI of the lumbar spine has been ordered, but has not yet been performed. Patient has remained hemodynamically stable and not in any acute respiratory distress. He will be observed on the medical-surgical unit for ongoing evaluation and management of acute intractable low back pain superimposed on chronic low back pain status post fusion.  Review of Systems:  All other systems reviewed and apart from HPI, are negative.  Past Medical History:  Diagnosis Date  . Carotid artery occlusion    40-60% right ICA stenosis (09/2008)  . Chronic back pain    follows with Nsurg  . CLL (chronic lymphoblastic leukemia) dx 2010   Followed at [facility] q82mo, no current therapy   . Coronary artery disease 2010   s/p CABG '10, sees Dr. Percival Spanish  . Diverticulosis   . DVT, lower extremity, recurrent ([place]) 2008, 2009   LLE, chronic anticoag since 2009  . Esophagitis   . Fibromyalgia   . Gallstones   . GERD (gastroesophageal reflux disease)   . Gout   . Gynecomastia, male   . H/O hiatal hernia 2008   surgery  . Hemorrhoids   . Hepatitis A yrs ago  . HIV infection ([place]) dx 1993  . Hypertension   . Impotence of organic origin   . Myocardial infarction 2010  x 2  . Neuromuscular disorder (H[facility])    neuropathy  . Osteoarthritis, knee    s/p B TKA  . Pneumonia mrach, may, july 2017  . Rheumatoid arthritis(714.0) dx 2010   MTX, follows with rheum  . Seasonal allergies   . Secondary syphilis 07/24/14 dx   s/p 2 wks doxy  . Status post dilation of esophageal narrowing   . TIA (transient ischemic attack) 1997   mild residual L mouth droop  . Tubular  adenoma of colon     Past Surgical History:  Procedure Laterality Date  . CHOLECYSTECTOMY    . COLONOSCOPY WITH PROPOFOL N/A 12/28/2012   Procedure: COLONOSCOPY WITH PROPOFOL;  Surgeon: Jerene Bears, MD;  Location: [facility];  Service: Gastroenterology;  Laterality: N/A;  . CORONARY ARTERY BYPASS GRAFT  2010   triple bypass  . ESOPHAGOGASTRODUODENOSCOPY (EGD) WITH PROPOFOL N/A 12/28/2012   Procedure: ESOPHAGOGASTRODUODENOSCOPY (EGD) WITH PROPOFOL;  Surgeon: Jerene Bears, MD;  Location: [facility];  Service: Gastroenterology;  Laterality: N/A;  . ESOPHAGOGASTRODUODENOSCOPY (EGD) WITH PROPOFOL N/A 03/15/2013   Procedure: ESOPHAGOGASTRODUODENOSCOPY (EGD) WITH PROPOFOL;  Surgeon: Jerene Bears, MD;  Location: [facility];  Service: Gastroenterology;  Laterality: N/A;  . ESOPHAGOGASTRODUODENOSCOPY (EGD) WITH PROPOFOL N/A 02/07/2016   Procedure: ESOPHAGOGASTRODUODENOSCOPY (EGD) WITH PROPOFOL;  Surgeon: Jerene Bears, MD;  Location: [facility];  Service: Gastroenterology;  Laterality: N/A;  . HARDWARE REMOVAL N/A 07/02/2012   Procedure: HARDWARE REMOVAL;  Surgeon: Elaina Hoops, MD;  Location: [place] [facility];  Service: Neurosurgery;  Laterality: N/A;  . HIATAL HERNIA REPAIR     wrap  . INGUINAL HERNIA REPAIR Bilateral   . JOINT REPLACEMENT Left 1999  . KNEE ARTHROPLASTY  07/22/2011   Procedure: COMPUTER ASSISTED TOTAL KNEE ARTHROPLASTY;  Surgeon: Meredith Pel, MD;  Location: [place];  Service: Orthopedics;  Laterality: Right;  Right total knee arthroplasty  . MANDIBLE SURGERY Bilateral    tmj  . REPLACEMENT TOTAL KNEE Bilateral   . ring around testicle hernia reapir  184 and 1986   x 2  . ROTATOR CUFF REPAIR Right   . SHOULDER SURGERY Left   . SPINE SURGERY  2010   "rod and screws", "failed", lopwer spine,   . stent to heart x 1  2010  . TONSILLECTOMY    . UMBILICAL HERNIA REPAIR     x 1  . varicose vein     stripping  . VIDEO BRONCHOSCOPY Bilateral 10/16/2015   Procedure: VIDEO  BRONCHOSCOPY WITHOUT FLUORO;  Surgeon: Brand Males, MD;  Location: [facility];  Service: Cardiopulmonary;  Laterality: Bilateral;     reports that he has never smoked. He has never used smokeless tobacco. He reports that he drinks alcohol. He reports that he does not use drugs.  Allergies  Allergen Reactions  . Morphine Other (See Comments)    REACTION: severe headache  . Other Anaphylaxis and Hives    Pecan  . Peanut-Containing Drug Products Anaphylaxis and Hives    Swelling of throat  . Oxycodone-Acetaminophen Other (See Comments)    REACTION: headache  . Penicillins Rash    Has patient had a PCN reaction causing immediate rash, facial/tongue/throat swelling, SOB or lightheadedness with hypotension: Yes Has patient had a PCN reaction causing severe rash involving mucus membranes or skin necrosis: No Has patient had a PCN reaction that required hospitalization No Has patient had a PCN reaction occurring within the last 10 years: No If all of the above answers are "NO", then may  proceed with Cephalosporin use.   REACTION: red, flushed  . Promethazine Hcl Other (See Comments)    REACTION: makes him feel drunk    Family History  Problem Relation Age of Onset  . Breast cancer Mother   . Hypertension Mother   . Hyperlipidemia Mother   . Diabetes Mother   . Prostate cancer Father   . Colon polyps Father   . Hyperlipidemia Father   . Crohn's disease Paternal Aunt   . Diabetes Maternal Grandmother   . Diabetes Brother     x 3  . Heart disease Brother     x 3  . Hyperlipidemia Brother     x 3  . Alcohol abuse Daughter   . Drug abuse Daughter   . Asthma Brother   . Colon cancer Neg Hx      Prior to Admission medications   Medication Sig Start Date End Date Taking? Authorizing Provider  aspirin EC 81 MG tablet Take 81 mg by mouth daily.    Historical Provider, MD  atorvastatin (LIPITOR) 10 MG tablet TAKE 1 TABLET (10 MG TOTAL) BY MOUTH AT BEDTIME. 12/12/15   [name], MD  Certolizumab Peg[facility]) Inject into the skin. Please confirm dosage strength  Injectable med replaces Remicade use per patient    Historical Provider, MD  ciclopirox (LOPROX) 0.77 % SUSP Apply 1 Act topically 2 (two) times daily. 12/31/15   [name], MD  escitalopram (LEXAPRO) 10 MG tablet Take 1 tablet (10 mg total) by mouth daily. 04/08/16   [name], MD  fluticasone (FLONASE) 50 MCG/ACT nasal spray Place 2 sprays into both nostrils daily. 03/03/16   Konrad Felix, PA  furosemide (LASIX) 20 MG tablet TAKE 1 TABLET (20 MG TOTAL) BY MOUTH 2 (TWO) TIMES DAILY. 04/28/16   [name], MD  GENVOYA 150-150-200-10 MG TABS tablet TAKE 1 TABLET BY MOUTH DAILY WITH BREAKFAST. 12/12/15   Campbell Riches, MD  loratadine (CLARITIN) 10 MG tablet TAKE 1 TABLET (10 MG TOTAL) BY MOUTH DAILY. 03/18/16   [name], MD  metoprolol succinate (TOPROL-XL) 25 MG 24 hr tablet Take 1 tablet (25 mg total) by mouth daily. Please schedule appointment for refills. 03/17/16   [name], MD  pantoprazole (PROTONIX) 40 MG tablet Take 1 tablet (40 mg total) by mouth 2 (two) times daily before a meal. Patient taking differently: Take 40 mg by mouth every evening.  01/03/16   Jerene Bears, MD  predniSONE (DELTASONE) 5 MG tablet Take 5 mg by mouth daily with breakfast.     Historical Provider, MD  pregabalin (LYRICA) 200 MG capsule Take 1 capsule (200 mg total) by mouth 2 (two) times daily. 04/16/16   Campbell Riches, MD  PREZISTA 800 MG tablet TAKE 1 TABLET (800 MG TOTAL) BY MOUTH DAILY. 12/12/15   Campbell Riches, MD  saccharomyces boulardii (FLORASTOR) 250 MG capsule Take 1 capsule (250 mg total) by mouth 2 (two) times daily. 02/05/15   [name] S Esterwood, PA-C  SAVAYSA 30 MG TABS tablet TAKE 1 TABLET (30 MG TOTAL) BY MOUTH DAILY. 03/29/16   [name], MD  sulfamethoxazole-trimethoprim (BACTRIM DS,SEPTRA DS) 800-160 MG tablet TAKE 1 TABLET BY MOUTH DAILY. 12/12/15   [name], MD    valACYclovir (VALTREX) 500 MG tablet TAKE 1 TABLET BY MOUTH EVERY DAY 03/25/16   Thayer Headings, MD  zolpidem (AMBIEN) 5 MG tablet TAKE 1 TABLET BY MOUTH AT BEDTIME AS  NEEDED FOR SLEEP 06/07/15   Biagio Borg, MD    Physical Exam: Vitals:   05/13/16 1918 05/13/16 2005 05/13/16 2124 05/13/16 2300  BP: 103/55  (!) 144/77   Pulse: 87  90   Resp: 18  18   Temp:  98.8 F (37.1 C) 99 F (37.2 C)   TempSrc:  Oral Oral   SpO2: 93%  97%   Weight:    91.3 kg (201 lb 4.5 oz)  Height:    5\' 5"  (1.651 m)      Constitutional: No respiratory distress, in obvious discomfort. Eyes: PERTLA, lids and conjunctivae normal ENMT: Mucous membranes are moist. Posterior pharynx clear of any exudate or lesions.   Neck: normal, supple, no masses, no thyromegaly Respiratory: clear to auscultation bilaterally, no wheezing, no crackles. Normal respiratory effort.   Cardiovascular: S1 & S2 heard, regular rate and rhythm, soft systolic murmur at apex. LLE edema. No significant JVD. Abdomen: No distension, tender in LLQ without rebound pain or guarding. Bowel sounds normal.  Musculoskeletal: no clubbing / cyanosis. LLE is edematous, warm, erythematous. Normal muscle tone.  Skin: no significant rashes, lesions, ulcers. Warm, dry, well-perfused.  Neurologic: CN 2-12 grossly intact. Sensation to light touch intact in bilateral UEs, but strength diminished in distal LLE. Bilateral dorsiflexion/plantaflexion 5/5.  Psychiatric: Normal judgment and insight. Alert and oriented x 3. Normal mood and affect.     Labs on Admission: I have personally reviewed following labs and imaging studies  CBC:  Recent Labs Lab 05/13/16 1405  WBC 10.3  NEUTROABS 3.6  HGB 12.2*  HCT 37.4*  MCV 86.2  PLT 123XX123*   Basic Metabolic Panel:  Recent Labs Lab 05/13/16 1405  NA 135  K 3.3*  CL 101  CO2 27  GLUCOSE 86  BUN 15  CREATININE 0.94  CALCIUM 8.4*   GFR: Estimated Creatinine Clearance: 79.2 mL/min (by C-G formula  based on SCr of 0.94 mg/dL). Liver Function Tests:  Recent Labs Lab 05/13/16 1405  AST 36  ALT 29  ALKPHOS 51  BILITOT 0.9  PROT 5.3*  ALBUMIN 3.1*    Recent Labs Lab 05/13/16 1405  LIPASE 12   No results for input(s): AMMONIA in the last 168 hours. Coagulation Profile: No results for input(s): INR, PROTIME in the last 168 hours. Cardiac Enzymes: No results for input(s): CKTOTAL, CKMB, CKMBINDEX, TROPONINI in the last 168 hours. BNP (last 3 results) No results for input(s): PROBNP in the last 8760 hours. HbA1C: No results for input(s): HGBA1C in the last 72 hours. CBG: No results for input(s): GLUCAP in the last 168 hours. Lipid Profile: No results for input(s): CHOL, HDL, LDLCALC, TRIG, CHOLHDL, LDLDIRECT in the last 72 hours. Thyroid Function Tests: No results for input(s): TSH, T4TOTAL, FREET4, T3FREE, THYROIDAB in the last 72 hours. Anemia Panel: No results for input(s): VITAMINB12, FOLATE, FERRITIN, TIBC, IRON, RETICCTPCT in the last 72 hours. Urine analysis:    Component Value Date/Time   COLORURINE AMBER (A) 05/13/2016 1754   APPEARANCEUR CLEAR 05/13/2016 1754   LABSPEC >1.046 (H) 05/13/2016 1754   PHURINE 5.0 05/13/2016 1754   GLUCOSEU NEGATIVE 05/13/2016 1754   HGBUR NEGATIVE 05/13/2016 1754   BILIRUBINUR NEGATIVE 05/13/2016 1754   KETONESUR 20 (A) 05/13/2016 1754   PROTEINUR 30 (A) 05/13/2016 1754   UROBILINOGEN 1.0 01/17/2015 1218   NITRITE NEGATIVE 05/13/2016 1754   LEUKOCYTESUR NEGATIVE 05/13/2016 1754   Sepsis Labs: @LABRCNTIP (procalcitonin:4,lacticidven:4) )No results found for this or any previous visit (from the past 240  hour(s)).   Radiological Exams on Admission: Ct Abdomen Pelvis W Contrast  Result Date: 05/13/2016 CLINICAL DATA:  Pt c/o left lower abd pain that radiates down left buttocks, nausea/vomiting Since Friday EXAM: CT ABDOMEN AND PELVIS WITH CONTRAST TECHNIQUE: Multidetector CT imaging of the abdomen and pelvis was performed  using the standard protocol following bolus administration of intravenous contrast. CONTRAST:  121mL ISOVUE-300 IOPAMIDOL (ISOVUE-300) INJECTION 61% COMPARISON:  11/06/2015 FINDINGS: Lower chest: Lung base subsegmental atelectasis and/ or scarring. 5 mm nodule in the costophrenic sulcus at the lateral left lower lobe lung base. Calcified nodes along the inferior right hilum. Hepatobiliary: Multiple calcifications consistent healed granuloma. No liver masses. Gallbladder surgically absent. No bile duct dilation Pancreas: Partial fatty replacement.  No mass or inflammation. Spleen: Mildly enlarged with multiple calcifications. Spleen measures 13.5 cm greatest dimension. It is stable from the prior study. Adrenals/Urinary Tract: No adrenal masses. No renal masses, no renal masses. Small nonobstructing stone in the lower pole the left kidney. Small nonobstructing stone in the upper pole the right kidney. No hydronephrosis. Ureters are normal course and in caliber. Bladder is unremarkable. Stomach/Bowel: Small hiatal hernia. No bowel obstruction. No bowel wall thickening. There are scattered colonic diverticula. Colon otherwise unremarkable. Appendix not visualized. Vascular/Lymphatic: There is bulky adenopathy. There are numerous enlarged retroperitoneal lymph nodes. Several reference measurements were made. There is in nodes the left of the aorta at the level of the lower pole the left kidney. It measures 2.6 cm in short axis, 2.1 cm previously. An aortocaval node measures 18 mm in short axis, previously 13 mm. Multiple other nodes appear larger than on the prior study. There is milder periceliac adenopathy largest node measuring 14 mm in short axis, previously 10 mm. There are also enlarged pelvic nodes, largest a left external iliac chain node measuring 18 mm in short axis unchanged from the prior study allowing for differences in measurement technique. A left common iliac chain node previously measured 11 mm,  currently 13 mm. There are atherosclerotic calcifications along a normal caliber abdominal aorta. Reproductive: Unremarkable Other: No abdominal wall hernia.  No ascites. Musculoskeletal: Postsurgical changes from the L2 through S1 posterior lumbar spine fusion. Degenerative changes throughout the visualized spine. No osteoblastic or osteolytic lesions. IMPRESSION: 1. No acute findings. 2. Bulky adenopathy most evident along the retroperitoneum and in the pelvis. This has mildly increased when compared to the most recent prior study. Based on the prior CT scan history, patient has HIV and chronic lymphocytic leukemia. 3. Mild splenomegaly, stable. 4. Changes from healed granulomatous disease, stable. 5. Small nodule at the base of the left lower lobe not evident on the prior CTs. This measures 5 mm. Electronically Signed   By: Lajean Manes M.D.   On: 05/13/2016 16:57   Ct L-spine No Charge  Result Date: 05/13/2016 CLINICAL DATA:  Back pain EXAM: CT LUMBAR SPINE WITHOUT CONTRAST TECHNIQUE: Multidetector CT imaging of the lumbar spine was performed without intravenous contrast administration. Multiplanar CT image reconstructions were also generated. COMPARISON:  CT abdomen pelvis 11/06/2015 FINDINGS: Segmentation: Normal segmentation.  Lowest disc space L5-S1 Alignment: Mild retrolisthesis L1-2. 21 degrees of levoscoliosis at L1-2 similar to the prior study. Vertebrae: Negative for fracture or mass. No evidence of spinal infection Paraspinal and other soft tissues: Paraspinous muscles show symmetric atrophy. No fluid collection or asymmetry. No retroperitoneal mass. Disc levels: T10-11: Mild ligamentum flavum calcification without significant spinal stenosis T12-L1:  Negative L1-2: Disc and facet degeneration. Diffuse disc bulging with moderate spinal  stenosis. This is just above the fusion level. L2-3: Left L1 pedicle screw is been removed. Right L1 pedicle screw in good position. Bilateral L2 pedicle screws in  good position. Interbody spacer in satisfactory position. Posterior decompression without significant stenosis. L3-4: Bilateral pedicle screw and interbody fusion. Posterior decompression without stenosis L4-5: Bilateral pedicle screw and posterior bony fusion. Bilateral laminectomy. No significant spinal stenosis L5-S1: Bilateral pedicle screw fusion. Right S1 screw crosses through the subarticular zone and could cause impingement or injury to the right S1 nerve root. This is unchanged from the CT of 11/06/2015. No significant spinal stenosis. IMPRESSION: Negative for fracture.  Moderate levoscoliosis. Surgical fusion L2 through S1 similar to prior studies. Right S1 screw passes through the subarticular zone in could cause injury to the right S1 nerve root. This is unchanged. No significant spinal stenosis. No evidence of mass or spinal infection. Electronically Signed   By: Franchot Gallo M.D.   On: 05/13/2016 16:58    EKG: Not performed, will obtain as appropriate.   Assessment/Plan  1. Acute on chronic low-back pain, intractable  - Pt with chronic LBP s/p fusion  - He has been immobilized with intractable pain for past 2 days - There was no precipitating event identified; no fevers or wt-loss reported; no incontinence or saddle paraesthesia  - CT L-spine with stable chronic changes; no mass, infection, or significant canal stenosis noted  - MRI L-spine ordered from ED, not yet performed  - Pt remains immobilized secondary to severe and intractable pain despite multiple doses of IV opiate in ED; will observe in hospital for ongoing management   2. Cellulitis, LLE  - Mild non-purulent cellulitis with penicillin allergy, started on empiric clindamycin  - Underlying DVT excluded with venous dopplers, notable for left inguinal adenopathy   3. HIV  - CD4 was 1,140 and VL 31 in October 2017  - Continue current management with Prezista and Genvoya    4. Hx of recurrent DVT  - Continue Savaysa  -  There is LLE edema, possibly related to post-thrombotic syndrome or pelvic and retroperitoneal adenopathy  - US performed and negative for DVT    5. CLL  - Pt continues to follow with oncology, not currently on any treatments  - CT abd/pelvis demonstrates a mild increase in retroperitoneal and pelvic adenopathy   6. CAD - No anginal complaints  - Continue ASA, Lipitor, Toprol    7. Hypertension  - BP has been at goal here, at little soft at times - Continue Toprol as tolerated    8. Rheumatoid arthritis  - Continue prednisone 5 mg daily   9. Incidental lung nodule  - 5 mm LLL nodule noted on CT    DVT prophylaxis: Savaysa  Code Status: Full  Family Communication: Discussed with patient Disposition Plan: Observe on med-surg Consults called: None Admission status: Observation    Vianne Bulls, MD [facility] Pager [phone]  If 7PM-7AM, please contact night-coverage [email or web address] Password [facility]  05/13/2016, 11:23 PM

## 2016-05-13 NOTE — ED Notes (Signed)
Admitting at bedside 

## 2016-05-13 NOTE — ED Triage Notes (Signed)
Pt presents for evaluation of ongoing chronic back pain flare up since Friday. Pt reports pain increases with movement. Denies fall/injury. Pt reports taking hydrocodone at 1000. Pt AxO x4

## 2016-05-13 NOTE — ED Provider Notes (Signed)
[facility] DEPT Provider Note   CSN: [id number] Arrival date & time: 05/13/16  1146     History   Chief Complaint Chief Complaint  Patient presents with  . Back Pain    HPI [name] is a 66 y.o. male with past medical history of HIV, CLL, history of DVT, chronic back pain, history of multiple lumbar spine surgeries, and rheumatoid arthritis who presents emergency Department with chief complaint of back pain and abdominal pain. Since last CD4 count was greater than 11,000, and his last viral load was 31. The patient also takes chronic immunosuppression for his rheumatoid arthritis. Patient states that he began having severe back pain 3 days ago. He has associated bilateral leg weakness and states that when he tries to stand up. He is to use all of his strength in his arms and gives out very easily with his walker. He has a history of previous lumbar and thoracic spine surgery in 2011 and then had to have a revision surgery for loose hardware in 2013. He states that his pain is "greater than 10 when he tries to move in any direction." He states that his legs are too weak to hold him. He denies saddle anesthesia, bowel or bladder incontinence. The patient also complains of severe pain in his left lower quadrant of his abdomen that radiates into the distribution of his psoas. He Denies any nausea, vomiting, constipation. The patient has also noticed pain and swelling in the left lower extremity. He has some post-thrombotic syndrome, previous DVT. [name] However, it is painful and warm and different from his previous, and chronic pain.  HPI  Past Medical History:  Diagnosis Date  . Carotid artery occlusion    40-60% right ICA stenosis (09/2008)  . Chronic back pain    follows with Nsurg  . CLL (chronic lymphoblastic leukemia) dx 2010   Followed at [facility] q7[facility], no current therapy   . Coronary artery disease 2010   s/p CABG '10, sees Dr. Percival Spanish  . Diverticulosis   . DVT, lower extremity,  recurrent ([place]) 2008, 2009   LLE, chronic anticoag since 2009  . Esophagitis   . Fibromyalgia   . Gallstones   . GERD (gastroesophageal reflux disease)   . Gout   . Gynecomastia, male   . H/O hiatal hernia 2008   surgery  . Hemorrhoids   . Hepatitis A yrs ago  . HIV infection ([place]) dx 1993  . Hypertension   . Impotence of organic origin   . Myocardial infarction 2010    x 2  . Neuromuscular disorder (HCC)    neuropathy  . Osteoarthritis, knee    s/p B TKA  . Pneumonia mrach, may, july 2017  . Rheumatoid arthritis(714.0) dx 2010   MTX, follows with rheum  . Seasonal allergies   . Secondary syphilis 07/24/14 dx   s/p 2 wks doxy  . Status post dilation of esophageal narrowing   . TIA (transient ischemic attack) 1997   mild residual L mouth droop  . Tubular adenoma of colon     Patient Active Problem List   Diagnosis Date Noted  . Leg weakness, bilateral 03/27/2016  . Gait instability 03/27/2016  . Cataracta 03/27/2016  . ILD (interstitial lung disease) ([place]) 09/13/2015  . Immunocompromised state ([place]) 09/13/2015  . Tinea cruris 09/12/2015  . Pressure ulcer 07/11/2015  . PCP (pneumocystis jiroveci pneumonia) ([place]) 06/18/2015  . Postinflammatory pulmonary fibrosis ([place]) 05/11/2015  . Secondary syphilis of skin 07/24/2014  . Insomnia  11/11/2013  . GERD (gastroesophageal reflux disease)   . Hereditary and idiopathic peripheral neuropathy 09/08/2013  . Erosive esophagitis 12/28/2012  . Depression 11/16/2012  . Allergic rhinitis, cause unspecified 04/28/2012  . Long term current use of anticoagulant therapy 02/03/2012  . Hypertension   . DVT, lower extremity, recurrent ([place])   . Osteoarthritis, knee   . Gout   . Chronic back pain   . Gynecomastia, male 11/19/2011  . Carotid artery occlusion   . Hyperlipidemia with target LDL less than 100   . HIV disease ([place]) 04/08/2011  . Rheumatoid arthritis ([place]) 04/08/2011  . Chronic lymphoblastic leukemia ([place]) 04/08/2011  .  Impotence of organic origin 04/02/2011  . CAD (coronary artery disease) of artery bypass graft 02/14/2009    Past Surgical History:  Procedure Laterality Date  . CHOLECYSTECTOMY    . COLONOSCOPY WITH PROPOFOL N/A 12/28/2012   Procedure: COLONOSCOPY WITH PROPOFOL;  Surgeon: Jerene Bears, MD;  Location: [facility];  Service: Gastroenterology;  Laterality: N/A;  . CORONARY ARTERY BYPASS GRAFT  2010   triple bypass  . ESOPHAGOGASTRODUODENOSCOPY (EGD) WITH PROPOFOL N/A 12/28/2012   Procedure: ESOPHAGOGASTRODUODENOSCOPY (EGD) WITH PROPOFOL;  Surgeon: Jerene Bears, MD;  Location: [facility];  Service: Gastroenterology;  Laterality: N/A;  . ESOPHAGOGASTRODUODENOSCOPY (EGD) WITH PROPOFOL N/A 03/15/2013   Procedure: ESOPHAGOGASTRODUODENOSCOPY (EGD) WITH PROPOFOL;  Surgeon: Jerene Bears, MD;  Location: [facility];  Service: Gastroenterology;  Laterality: N/A;  . ESOPHAGOGASTRODUODENOSCOPY (EGD) WITH PROPOFOL N/A 02/07/2016   Procedure: ESOPHAGOGASTRODUODENOSCOPY (EGD) WITH PROPOFOL;  Surgeon: Jerene Bears, MD;  Location: [facility];  Service: Gastroenterology;  Laterality: N/A;  . HARDWARE REMOVAL N/A 07/02/2012   Procedure: HARDWARE REMOVAL;  Surgeon: Elaina Hoops, MD;  Location: [facility];  Service: Neurosurgery;  Laterality: N/A;  . HIATAL HERNIA REPAIR     wrap  . INGUINAL HERNIA REPAIR Bilateral   . JOINT REPLACEMENT Left 1999  . KNEE ARTHROPLASTY  07/22/2011   Procedure: COMPUTER ASSISTED TOTAL KNEE ARTHROPLASTY;  Surgeon: Meredith Pel, MD;  Location: [facility];  Service: Orthopedics;  Laterality: Right;  Right total knee arthroplasty  . MANDIBLE SURGERY Bilateral    tmj  . REPLACEMENT TOTAL KNEE Bilateral   . ring around testicle hernia reapir  184 and 1986   x 2  . ROTATOR CUFF REPAIR Right   . SHOULDER SURGERY Left   . SPINE SURGERY  2010   "rod and screws", "failed", lopwer spine,   . stent to heart x 1  2010  . TONSILLECTOMY    . UMBILICAL HERNIA REPAIR     x 1  . varicose  vein     stripping  . VIDEO BRONCHOSCOPY Bilateral 10/16/2015   Procedure: VIDEO BRONCHOSCOPY WITHOUT FLUORO;  Surgeon: Brand Males, MD;  Location: [facility];  Service: Cardiopulmonary;  Laterality: Bilateral;       Home Medications    Prior to Admission medications   Medication Sig Start Date End Date Taking? Authorizing Provider  aspirin EC 81 MG tablet Take 81 mg by mouth daily.    Historical Provider, MD  atorvastatin (LIPITOR) 10 MG tablet TAKE 1 TABLET (10 MG TOTAL) BY MOUTH AT BEDTIME. 12/12/15   [name], MD  Certolizumab Pegol [facility]) Inject into the skin. Please confirm dosage strength  Injectable med replaces Remicade use per patient    Historical Provider, MD  ciclopirox (LOPROX) 0.77 % SUSP Apply 1 Act topically 2 (two) times daily. 12/31/15   [name], MD  escitalopram (LEXAPRO) 10 MG tablet Take 1 tablet (10 mg total) by mouth daily. 04/08/16   [name], MD  fluticasone (FLONASE) 50 MCG/ACT nasal spray Place 2 sprays into both nostrils daily. 03/03/16   Konrad Felix, PA  furosemide (LASIX) 20 MG tablet TAKE 1 TABLET (20 MG TOTAL) BY MOUTH 2 (TWO) TIMES DAILY. 04/28/16   [name], MD  GENVOYA 150-150-200-10 MG TABS tablet TAKE 1 TABLET BY MOUTH DAILY WITH BREAKFAST. 12/12/15   Campbell Riches, MD  loratadine (CLARITIN) 10 MG tablet TAKE 1 TABLET (10 MG TOTAL) BY MOUTH DAILY. 03/18/16   [name], MD  metoprolol succinate (TOPROL-XL) 25 MG 24 hr tablet Take 1 tablet (25 mg total) by mouth daily. Please schedule appointment for refills. 03/17/16   [name], MD  pantoprazole (PROTONIX) 40 MG tablet Take 1 tablet (40 mg total) by mouth 2 (two) times daily before a meal. Patient taking differently: Take 40 mg by mouth every evening.  01/03/16   Jerene Bears, MD  predniSONE (DELTASONE) 5 MG tablet Take 5 mg by mouth daily with breakfast.     Historical Provider, MD  pregabalin (LYRICA) 200 MG capsule Take 1 capsule (200 mg total) by mouth 2  (two) times daily. 04/16/16   Campbell Riches, MD  PREZISTA 800 MG tablet TAKE 1 TABLET (800 MG TOTAL) BY MOUTH DAILY. 12/12/15   Campbell Riches, MD  saccharomyces boulardii (FLORASTOR) 250 MG capsule Take 1 capsule (250 mg total) by mouth 2 (two) times daily. 02/05/15   [name] S Esterwood, PA-C  SAVAYSA 30 MG TABS tablet TAKE 1 TABLET (30 MG TOTAL) BY MOUTH DAILY. 03/29/16   [name], MD  sulfamethoxazole-trimethoprim (BACTRIM DS,SEPTRA DS) 800-160 MG tablet TAKE 1 TABLET BY MOUTH DAILY. 12/12/15   [name], MD  valACYclovir (VALTREX) 500 MG tablet TAKE 1 TABLET BY MOUTH EVERY DAY 03/25/16   Thayer Headings, MD  zolpidem (AMBIEN) 5 MG tablet TAKE 1 TABLET BY MOUTH AT BEDTIME AS NEEDED FOR SLEEP 06/07/15   Biagio Borg, MD    Family History Family History  Problem Relation Age of Onset  . Breast cancer Mother   . Hypertension Mother   . Hyperlipidemia Mother   . Diabetes Mother   . Prostate cancer Father   . Colon polyps Father   . Hyperlipidemia Father   . Crohn's disease Paternal Aunt   . Diabetes Maternal Grandmother   . Diabetes Brother     x 3  . Heart disease Brother     x 3  . Hyperlipidemia Brother     x 3  . Alcohol abuse Daughter   . Drug abuse Daughter   . Asthma Brother   . Colon cancer Neg Hx     Social History Social History  Substance Use Topics  . Smoking status: Never Smoker  . Smokeless tobacco: Never Used     Comment: occ wine  . Alcohol use Yes     Comment: occasional wine- 1-2 per week     Allergies   Morphine; Other; Peanut-containing drug products; Oxycodone-acetaminophen; Penicillins; and Promethazine hcl   Review of Systems Review of Systems Ten systems reviewed and are negative for acute change, except as noted in the HPI.    Physical Exam Updated Vital Signs BP (!) 91/49 (BP Location: Left Arm)   Pulse 83   Temp 98.4 F (36.9 C) (Oral)   Resp 16   SpO2 93%  Physical Exam  Constitutional: He is oriented to person, place,  and time. He appears well-developed and well-nourished. No distress.  Exam limited due to patient tolerance   HENT:  Head: Normocephalic and atraumatic.  Eyes: Conjunctivae are normal. No scleral icterus.  Neck: Normal range of motion. Neck supple.  Cardiovascular: Normal rate, regular rhythm and normal heart sounds.   Pulmonary/Chest: Effort normal and breath sounds normal. No respiratory distress.  Abdominal: Soft. He exhibits no distension. There is tenderness (LLQ).  Musculoskeletal: He exhibits no edema.  Hardware palpable in LUmbar spine. Tender in lower thoracic region midline.  Strength of hip flexion 3/5   Neurological: He is alert and oriented to person, place, and time.  Skin: Skin is warm and dry. He is not diaphoretic.  Left lower extremity warm, erythematous with chronic venous tattooing and swelling in the foot. DP/Pt pulse present BL feet  Psychiatric: His behavior is normal.  Nursing note and vitals reviewed.    ED Treatments / Results  Labs (all labs ordered are listed, but only abnormal results are displayed) Labs Reviewed  CBC WITH DIFFERENTIAL/PLATELET  COMPREHENSIVE METABOLIC PANEL  URINALYSIS, ROUTINE W REFLEX MICROSCOPIC  LIPASE, BLOOD  I-STAT CG4 LACTIC ACID, ED    EKG  EKG Interpretation None       Radiology No results found.  Procedures Procedures (including critical care time)  Medications Ordered in ED Medications  sodium chloride 0.9 % bolus 1,000 mL (1,000 mLs Intravenous New Bag/Given 05/13/16 1430)     Initial Impression / Assessment and Plan / ED Course  I have reviewed the triage vital signs and the nursing notes.  Pertinent labs & imaging results that were available during my care of the patient were reviewed by me and considered in my medical decision making (see chart for details).  Clinical Course      patient immunocompromised and immunosuppressed. ? Dvt/ cellulitis/ venous stasis derm of the LLE. Patient with  potential for infectious/ malignant issues involving the back. Unable to ambulate. Work up pending. I have given report to PA Joy Who will assume care.  Final Clinical Impressions(s) / ED Diagnoses   Final diagnoses:  Lower extremity weakness    New Prescriptions New Prescriptions   No medications on file     Margarita Mail, PA-C 05/14/16 [id number]    Isla Pence, MD 05/15/16 [phone]

## 2016-05-13 NOTE — ED Provider Notes (Signed)
[name] is a 66 y.o. male, with a history of HIV, CLL, and multiple spinal surgeries, presenting to the ED with severe lower back pain and bilateral lower extremity weakness.  HPI from [place], PA-C: "[name] is a 66 y.o. male with past medical history of HIV, CLL, history of DVT, chronic back pain, history of multiple lumbar spine surgeries, and rheumatoid arthritis who presents emergency Department with chief complaint of back pain and abdominal pain. Since last CD4 count was greater than 11,000, and his last viral load was 31. The patient also takes chronic immunosuppression for his rheumatoid arthritis. Patient states that he began having severe back pain 3 days ago. He has associated bilateral leg weakness and states that when he tries to stand up. He is to use all of his strength in his arms and gives out very easily with his walker. He has a history of previous lumbar and thoracic spine surgery in 2011 and then had to have a revision surgery for loose hardware in 2013. He states that his pain is "greater than 10 when he tries to move in any direction." He states that his legs are too weak to hold him. He denies saddle anesthesia, bowel or bladder incontinence. The patient also complains of severe pain in his left lower quadrant of his abdomen that radiates into the distribution of his psoas. He Denies any nausea, vomiting, constipation. The patient has also noticed pain and swelling in the left lower extremity. He has some post-thrombotic syndrome, previous DVT. [name] However, it is painful and warm and different from his previous, and chronic pain."   Patient's neurosurgeon is Dr. Saintclair Halsted.   Past Medical History:  Diagnosis Date  . Carotid artery occlusion    40-60% right ICA stenosis (09/2008)  . Chronic back pain    follows with Nsurg  . CLL (chronic lymphoblastic leukemia) dx 2010   Followed at [facility] q48mo, no current therapy   . Coronary artery disease 2010   s/p CABG '10,  sees Dr. Percival Spanish  . Diverticulosis   . DVT, lower extremity, recurrent ([place]) 2008, 2009   LLE, chronic anticoag since 2009  . Esophagitis   . Fibromyalgia   . Gallstones   . GERD (gastroesophageal reflux disease)   . Gout   . Gynecomastia, male   . H/O hiatal hernia 2008   surgery  . Hemorrhoids   . Hepatitis A yrs ago  . HIV infection ([place]) dx 1993  . Hypertension   . Impotence of organic origin   . Myocardial infarction 2010    x 2  . Neuromuscular disorder ([facility])    neuropathy  . Osteoarthritis, knee    s/p B TKA  . Pneumonia mrach, may, july 2017  . Rheumatoid arthritis(714.0) dx 2010   MTX, follows with rheum  . Seasonal allergies   . Secondary syphilis 07/24/14 dx   s/p 2 wks doxy  . Status post dilation of esophageal narrowing   . TIA (transient ischemic attack) 1997   mild residual L mouth droop  . Tubular adenoma of colon    Physical Exam  BP (!) 103/49 (BP Location: Left Arm)   Pulse 83   Temp 98.4 F (36.9 C) (Oral)   Resp 18   SpO2 98%   Physical Exam  Constitutional: He appears well-developed and well-nourished. No distress.  HENT:  Head: Normocephalic and atraumatic.  Eyes: Conjunctivae are normal.  Neck: Neck supple.  Cardiovascular: Normal rate, regular rhythm, normal heart sounds  and intact distal pulses.   Pulmonary/Chest: Effort normal and breath sounds normal. No respiratory distress.  Abdominal: Soft. There is tenderness. There is no guarding.  Musculoskeletal: He exhibits no edema.  Exquisitely tender lower back, including midline spine. Spinal hardware palpable. Patient has motor function in all 4 extremities.  Lymphadenopathy:    He has no cervical adenopathy.  Neurological: He is alert.  No noted sensory deficits in the lower extremities. 4 out of 5 strength bilaterally. Any movement of the legs elicits severe pain in the lower back. Patient is unable to get out of bed to test gait.  Skin: Skin is warm and dry. He is not diaphoretic.   Psychiatric: He has a normal mood and affect. His behavior is normal.  Nursing note and vitals reviewed.   ED Course  Procedures   Results for orders placed or performed during the hospital encounter of 05/13/16  CBC with Differential  Result Value Ref Range   WBC 10.3 4.0 - 10.5 K/uL   RBC 4.34 4.22 - 5.81 MIL/uL   Hemoglobin 12.2 (L) 13.0 - 17.0 g/dL   HCT 37.4 (L) 39.0 - 52.0 %   MCV 86.2 78.0 - 100.0 fL   MCH 28.1 26.0 - 34.0 pg   MCHC 32.6 30.0 - 36.0 g/dL   RDW 15.8 (H) 11.5 - 15.5 %   Platelets 117 (L) 150 - 400 K/uL   Neutrophils Relative % 35 %   Lymphocytes Relative 49 %   Monocytes Relative 16 %   Eosinophils Relative 0 %   Basophils Relative 0 %   Band Neutrophils 0 %   Metamyelocytes Relative 0 %   Myelocytes 0 %   Promyelocytes Absolute 0 %   Blasts 0 %   nRBC 0 0 /100 WBC   Other 0 %   Neutro Abs 3.6 1.7 - 7.7 K/uL   Lymphs Abs 5.1 (H) 0.7 - 4.0 K/uL   Monocytes Absolute 1.6 (H) 0.1 - 1.0 K/uL   Eosinophils Absolute 0.0 0.0 - 0.7 K/uL   Basophils Absolute 0.0 0.0 - 0.1 K/uL   WBC Morphology ATYPICAL LYMPHOCYTES   Comprehensive metabolic panel  Result Value Ref Range   Sodium 135 135 - 145 mmol/L   Potassium 3.3 (L) 3.5 - 5.1 mmol/L   Chloride 101 101 - 111 mmol/L   CO2 27 22 - 32 mmol/L   Glucose, Bld 86 65 - 99 mg/dL   BUN 15 6 - 20 mg/dL   Creatinine, Ser 0.94 0.61 - 1.24 mg/dL   Calcium 8.4 (L) 8.9 - 10.3 mg/dL   Total Protein 5.3 (L) 6.5 - 8.1 g/dL   Albumin 3.1 (L) 3.5 - 5.0 g/dL   AST 36 15 - 41 U/L   ALT 29 17 - 63 U/L   Alkaline Phosphatase 51 38 - 126 U/L   Total Bilirubin 0.9 0.3 - 1.2 mg/dL   GFR calc non Af Amer >60 >60 mL/min   GFR calc Af Amer >60 >60 mL/min   Anion gap 7 5 - 15  Urinalysis, Routine w reflex microscopic  Result Value Ref Range   Color, Urine AMBER (A) YELLOW   APPearance CLEAR CLEAR   Specific Gravity, Urine >1.046 (H) 1.005 - 1.030   pH 5.0 5.0 - 8.0   Glucose, UA NEGATIVE NEGATIVE mg/dL   Hgb urine  dipstick NEGATIVE NEGATIVE   Bilirubin Urine NEGATIVE NEGATIVE   Ketones, ur 20 (A) NEGATIVE mg/dL   Protein, ur 30 (A) NEGATIVE mg/dL  Nitrite NEGATIVE NEGATIVE   Leukocytes, UA NEGATIVE NEGATIVE   RBC / HPF 0-5 0 - 5 RBC/hpf   WBC, UA 0-5 0 - 5 WBC/hpf   Bacteria, UA RARE (A) NONE SEEN   Squamous Epithelial / LPF NONE SEEN NONE SEEN   Mucous PRESENT    Hyaline Casts, UA PRESENT   Lipase, blood  Result Value Ref Range   Lipase 12 11 - 51 U/L  I-Stat CG4 Lactic Acid, ED  Result Value Ref Range   Lactic Acid, Venous 1.28 0.5 - 1.9 mmol/L   Ct Abdomen Pelvis W Contrast  Result Date: 05/13/2016 CLINICAL DATA:  Pt c/o left lower abd pain that radiates down left buttocks, nausea/vomiting Since Friday EXAM: CT ABDOMEN AND PELVIS WITH CONTRAST TECHNIQUE: Multidetector CT imaging of the abdomen and pelvis was performed using the standard protocol following bolus administration of intravenous contrast. CONTRAST:  170mL ISOVUE-300 IOPAMIDOL (ISOVUE-300) INJECTION 61% COMPARISON:  11/06/2015 FINDINGS: Lower chest: Lung base subsegmental atelectasis and/ or scarring. 5 mm nodule in the costophrenic sulcus at the lateral left lower lobe lung base. Calcified nodes along the inferior right hilum. Hepatobiliary: Multiple calcifications consistent healed granuloma. No liver masses. Gallbladder surgically absent. No bile duct dilation Pancreas: Partial fatty replacement.  No mass or inflammation. Spleen: Mildly enlarged with multiple calcifications. Spleen measures 13.5 cm greatest dimension. It is stable from the prior study. Adrenals/Urinary Tract: No adrenal masses. No renal masses, no renal masses. Small nonobstructing stone in the lower pole the left kidney. Small nonobstructing stone in the upper pole the right kidney. No hydronephrosis. Ureters are normal course and in caliber. Bladder is unremarkable. Stomach/Bowel: Small hiatal hernia. No bowel obstruction. No bowel wall thickening. There are scattered  colonic diverticula. Colon otherwise unremarkable. Appendix not visualized. Vascular/Lymphatic: There is bulky adenopathy. There are numerous enlarged retroperitoneal lymph nodes. Several reference measurements were made. There is in nodes the left of the aorta at the level of the lower pole the left kidney. It measures 2.6 cm in short axis, 2.1 cm previously. An aortocaval node measures 18 mm in short axis, previously 13 mm. Multiple other nodes appear larger than on the prior study. There is milder periceliac adenopathy largest node measuring 14 mm in short axis, previously 10 mm. There are also enlarged pelvic nodes, largest a left external iliac chain node measuring 18 mm in short axis unchanged from the prior study allowing for differences in measurement technique. A left common iliac chain node previously measured 11 mm, currently 13 mm. There are atherosclerotic calcifications along a normal caliber abdominal aorta. Reproductive: Unremarkable Other: No abdominal wall hernia.  No ascites. Musculoskeletal: Postsurgical changes from the L2 through S1 posterior lumbar spine fusion. Degenerative changes throughout the visualized spine. No osteoblastic or osteolytic lesions. IMPRESSION: 1. No acute findings. 2. Bulky adenopathy most evident along the retroperitoneum and in the pelvis. This has mildly increased when compared to the most recent prior study. Based on the prior CT scan history, patient has HIV and chronic lymphocytic leukemia. 3. Mild splenomegaly, stable. 4. Changes from healed granulomatous disease, stable. 5. Small nodule at the base of the left lower lobe not evident on the prior CTs. This measures 5 mm. Electronically Signed   By: Lajean Manes M.D.   On: 05/13/2016 16:57   Ct L-spine No Charge  Result Date: 05/13/2016 CLINICAL DATA:  Back pain EXAM: CT LUMBAR SPINE WITHOUT CONTRAST TECHNIQUE: Multidetector CT imaging of the lumbar spine was performed without intravenous contrast  administration. Multiplanar  CT image reconstructions were also generated. COMPARISON:  CT abdomen pelvis 11/06/2015 FINDINGS: Segmentation: Normal segmentation.  Lowest disc space L5-S1 Alignment: Mild retrolisthesis L1-2. 21 degrees of levoscoliosis at L1-2 similar to the prior study. Vertebrae: Negative for fracture or mass. No evidence of spinal infection Paraspinal and other soft tissues: Paraspinous muscles show symmetric atrophy. No fluid collection or asymmetry. No retroperitoneal mass. Disc levels: T10-11: Mild ligamentum flavum calcification without significant spinal stenosis T12-L1:  Negative L1-2: Disc and facet degeneration. Diffuse disc bulging with moderate spinal stenosis. This is just above the fusion level. L2-3: Left L1 pedicle screw is been removed. Right L1 pedicle screw in good position. Bilateral L2 pedicle screws in good position. Interbody spacer in satisfactory position. Posterior decompression without significant stenosis. L3-4: Bilateral pedicle screw and interbody fusion. Posterior decompression without stenosis L4-5: Bilateral pedicle screw and posterior bony fusion. Bilateral laminectomy. No significant spinal stenosis L5-S1: Bilateral pedicle screw fusion. Right S1 screw crosses through the subarticular zone and could cause impingement or injury to the right S1 nerve root. This is unchanged from the CT of 11/06/2015. No significant spinal stenosis. IMPRESSION: Negative for fracture.  Moderate levoscoliosis. Surgical fusion L2 through S1 similar to prior studies. Right S1 screw passes through the subarticular zone in could cause injury to the right S1 nerve root. This is unchanged. No significant spinal stenosis. No evidence of mass or spinal infection. Electronically Signed   By: Franchot Gallo M.D.   On: 05/13/2016 16:58    MDM  Took patient care handoff report from Margarita Mail, PA-C. Plan: Review lumbar spine CT, abdominal CT, LE duplex US and dispo accordingly.  No acute  abnormalities in the patient's lumbar spine or abdominal CTs. No sign of DVT noted on duplex ultrasound. Upon repeated examination, patient still endorses lower extremity weakness. Unable to get the patient to walk due to pain and weakness. Patient and patient's daughter at the bedside state they would feel unsafe with discharge. Patient will need admission and MRI. Patient confirms that the hardware in his back is MRI compatible.  8:04 PM Spoke with Dr. Myna Hidalgo, hospitalist, who agreed to admit the patient to med-surg observation.   Vitals:   05/13/16 1200 05/13/16 1236 05/13/16 1517  BP: (!) 102/47 (!) 91/49 (!) 103/49  Pulse: 79 83 83  Resp: 18 16 18   Temp: 97.8 F (36.6 C) 98.4 F (36.9 C)   TempSrc: Oral Oral   SpO2: 97% 93% 98%   Vitals:   05/13/16 1918 05/13/16 2005 05/13/16 2124 05/13/16 2300  BP: 103/55  (!) 144/77   Pulse: 87  90   Resp: 18  18   Temp:  98.8 F (37.1 C) 99 F (37.2 C)   TempSrc:  Oral Oral   SpO2: 93%  97%   Weight:    91.3 kg  Height:    5\' 5"  (1.651 m)       Lorayne Bender, PA-C 05/13/16 [place] Liu, MD 05/14/16 [phone]

## 2016-05-13 NOTE — ED Notes (Signed)
Patient transported to CT 

## 2016-05-14 ENCOUNTER — Observation Stay (HOSPITAL_COMMUNITY): Payer: Medicare Other

## 2016-05-14 ENCOUNTER — Ambulatory Visit: Payer: Self-pay | Admitting: Internal Medicine

## 2016-05-14 DIAGNOSIS — B20 Human immunodeficiency virus [HIV] disease: Secondary | ICD-10-CM | POA: Diagnosis not present

## 2016-05-14 DIAGNOSIS — M549 Dorsalgia, unspecified: Secondary | ICD-10-CM | POA: Diagnosis not present

## 2016-05-14 DIAGNOSIS — C91Z0 Other lymphoid leukemia not having achieved remission: Secondary | ICD-10-CM | POA: Diagnosis not present

## 2016-05-14 DIAGNOSIS — R079 Chest pain, unspecified: Secondary | ICD-10-CM | POA: Diagnosis not present

## 2016-05-14 DIAGNOSIS — K5900 Constipation, unspecified: Secondary | ICD-10-CM | POA: Diagnosis not present

## 2016-05-14 DIAGNOSIS — R2689 Other abnormalities of gait and mobility: Secondary | ICD-10-CM | POA: Diagnosis not present

## 2016-05-14 DIAGNOSIS — R140 Abdominal distension (gaseous): Secondary | ICD-10-CM | POA: Diagnosis not present

## 2016-05-14 DIAGNOSIS — I2510 Atherosclerotic heart disease of native coronary artery without angina pectoris: Secondary | ICD-10-CM | POA: Diagnosis present

## 2016-05-14 DIAGNOSIS — D649 Anemia, unspecified: Secondary | ICD-10-CM | POA: Diagnosis present

## 2016-05-14 DIAGNOSIS — M069 Rheumatoid arthritis, unspecified: Secondary | ICD-10-CM | POA: Diagnosis not present

## 2016-05-14 DIAGNOSIS — M5440 Lumbago with sciatica, unspecified side: Secondary | ICD-10-CM | POA: Diagnosis not present

## 2016-05-14 DIAGNOSIS — R1084 Generalized abdominal pain: Secondary | ICD-10-CM | POA: Diagnosis not present

## 2016-05-14 DIAGNOSIS — L03116 Cellulitis of left lower limb: Secondary | ICD-10-CM | POA: Diagnosis not present

## 2016-05-14 DIAGNOSIS — R531 Weakness: Secondary | ICD-10-CM | POA: Diagnosis not present

## 2016-05-14 DIAGNOSIS — M25552 Pain in left hip: Secondary | ICD-10-CM | POA: Diagnosis not present

## 2016-05-14 DIAGNOSIS — Z86718 Personal history of other venous thrombosis and embolism: Secondary | ICD-10-CM | POA: Diagnosis not present

## 2016-05-14 DIAGNOSIS — I82409 Acute embolism and thrombosis of unspecified deep veins of unspecified lower extremity: Secondary | ICD-10-CM | POA: Diagnosis not present

## 2016-05-14 DIAGNOSIS — E876 Hypokalemia: Secondary | ICD-10-CM | POA: Diagnosis not present

## 2016-05-14 DIAGNOSIS — D696 Thrombocytopenia, unspecified: Secondary | ICD-10-CM | POA: Diagnosis not present

## 2016-05-14 DIAGNOSIS — M546 Pain in thoracic spine: Secondary | ICD-10-CM | POA: Diagnosis not present

## 2016-05-14 DIAGNOSIS — J189 Pneumonia, unspecified organism: Secondary | ICD-10-CM | POA: Diagnosis not present

## 2016-05-14 DIAGNOSIS — I25708 Atherosclerosis of coronary artery bypass graft(s), unspecified, with other forms of angina pectoris: Secondary | ICD-10-CM | POA: Diagnosis not present

## 2016-05-14 DIAGNOSIS — R911 Solitary pulmonary nodule: Secondary | ICD-10-CM | POA: Diagnosis present

## 2016-05-14 DIAGNOSIS — C9510 Chronic leukemia of unspecified cell type not having achieved remission: Secondary | ICD-10-CM | POA: Diagnosis not present

## 2016-05-14 DIAGNOSIS — E871 Hypo-osmolality and hyponatremia: Secondary | ICD-10-CM | POA: Diagnosis not present

## 2016-05-14 DIAGNOSIS — C9110 Chronic lymphocytic leukemia of B-cell type not having achieved remission: Secondary | ICD-10-CM | POA: Diagnosis not present

## 2016-05-14 DIAGNOSIS — K219 Gastro-esophageal reflux disease without esophagitis: Secondary | ICD-10-CM | POA: Diagnosis present

## 2016-05-14 DIAGNOSIS — G8929 Other chronic pain: Secondary | ICD-10-CM | POA: Diagnosis not present

## 2016-05-14 DIAGNOSIS — M545 Low back pain: Secondary | ICD-10-CM | POA: Diagnosis not present

## 2016-05-14 DIAGNOSIS — R0602 Shortness of breath: Secondary | ICD-10-CM | POA: Diagnosis not present

## 2016-05-14 DIAGNOSIS — R52 Pain, unspecified: Secondary | ICD-10-CM | POA: Diagnosis not present

## 2016-05-14 DIAGNOSIS — Z981 Arthrodesis status: Secondary | ICD-10-CM | POA: Diagnosis not present

## 2016-05-14 DIAGNOSIS — M5134 Other intervertebral disc degeneration, thoracic region: Secondary | ICD-10-CM | POA: Diagnosis present

## 2016-05-14 DIAGNOSIS — M797 Fibromyalgia: Secondary | ICD-10-CM | POA: Diagnosis present

## 2016-05-14 DIAGNOSIS — M5442 Lumbago with sciatica, left side: Secondary | ICD-10-CM | POA: Diagnosis not present

## 2016-05-14 DIAGNOSIS — M5136 Other intervertebral disc degeneration, lumbar region: Secondary | ICD-10-CM | POA: Diagnosis not present

## 2016-05-14 DIAGNOSIS — X58XXXA Exposure to other specified factors, initial encounter: Secondary | ICD-10-CM | POA: Diagnosis present

## 2016-05-14 DIAGNOSIS — N451 Epididymitis: Secondary | ICD-10-CM | POA: Diagnosis present

## 2016-05-14 DIAGNOSIS — K567 Ileus, unspecified: Secondary | ICD-10-CM | POA: Diagnosis not present

## 2016-05-14 DIAGNOSIS — M519 Unspecified thoracic, thoracolumbar and lumbosacral intervertebral disc disorder: Secondary | ICD-10-CM | POA: Diagnosis not present

## 2016-05-14 DIAGNOSIS — M6281 Muscle weakness (generalized): Secondary | ICD-10-CM | POA: Diagnosis not present

## 2016-05-14 DIAGNOSIS — F329 Major depressive disorder, single episode, unspecified: Secondary | ICD-10-CM | POA: Diagnosis not present

## 2016-05-14 DIAGNOSIS — N5089 Other specified disorders of the male genital organs: Secondary | ICD-10-CM | POA: Diagnosis not present

## 2016-05-14 DIAGNOSIS — N433 Hydrocele, unspecified: Secondary | ICD-10-CM | POA: Diagnosis not present

## 2016-05-14 DIAGNOSIS — Z951 Presence of aortocoronary bypass graft: Secondary | ICD-10-CM | POA: Diagnosis not present

## 2016-05-14 DIAGNOSIS — I10 Essential (primary) hypertension: Secondary | ICD-10-CM | POA: Diagnosis not present

## 2016-05-14 DIAGNOSIS — R109 Unspecified abdominal pain: Secondary | ICD-10-CM | POA: Diagnosis not present

## 2016-05-14 LAB — BASIC METABOLIC PANEL
Anion gap: 12 (ref 5–15)
BUN: 12 mg/dL (ref 6–20)
CO2: 25 mmol/L (ref 22–32)
Calcium: 8.3 mg/dL — ABNORMAL LOW (ref 8.9–10.3)
Chloride: 99 mmol/L — ABNORMAL LOW (ref 101–111)
Creatinine, Ser: 0.93 mg/dL (ref 0.61–1.24)
GFR calc Af Amer: >60 mL/min (ref >60)
GFR calc non Af Amer: >60 mL/min (ref >60)
Glucose, Bld: 74 mg/dL (ref 65–99)
Potassium: 3.5 mmol/L (ref 3.5–5.1)
Sodium: 136 mmol/L (ref 135–145)

## 2016-05-14 LAB — TROPONIN I: Troponin I: <0.03 ng/mL (ref <0.03)

## 2016-05-14 LAB — GLUCOSE, CAPILLARY
Glucose-Capillary: 64 mg/dL — ABNORMAL LOW (ref 65–99)
Glucose-Capillary: 76 mg/dL (ref 65–99)

## 2016-05-14 MED ORDER — SENNOSIDES-DOCUSATE SODIUM 8.6-50 MG PO TABS
1.0000 | ORAL_TABLET | Freq: Two times a day (BID) | ORAL | Status: DC
  Administered 2016-05-14 – 2016-05-24 (×17): 1 via ORAL
  Filled 2016-05-14 (×21): qty 1

## 2016-05-14 MED ORDER — POLYVINYL ALCOHOL 1.4 % OP SOLN
1.0000 [drp] | OPHTHALMIC | Status: DC | PRN
  Administered 2016-05-14: 1 [drp] via OPHTHALMIC
  Filled 2016-05-14: qty 15

## 2016-05-14 MED ORDER — GUAIFENESIN ER 600 MG PO TB12
600.0000 mg | ORAL_TABLET | Freq: Two times a day (BID) | ORAL | Status: DC
  Administered 2016-05-14 – 2016-05-24 (×20): 600 mg via ORAL
  Filled 2016-05-14 (×20): qty 1

## 2016-05-14 MED ORDER — NITROGLYCERIN 0.4 MG SL SUBL
0.4000 mg | SUBLINGUAL_TABLET | SUBLINGUAL | Status: DC | PRN
  Administered 2016-05-14 (×2): 0.4 mg via SUBLINGUAL

## 2016-05-14 MED ORDER — NITROGLYCERIN 0.4 MG SL SUBL
SUBLINGUAL_TABLET | SUBLINGUAL | Status: AC
  Filled 2016-05-14: qty 1

## 2016-05-14 MED ORDER — DEXTROSE 5 % IV SOLN
500.0000 mg | INTRAVENOUS | Status: DC
  Administered 2016-05-14 – 2016-05-15 (×2): 500 mg via INTRAVENOUS
  Filled 2016-05-14 (×2): qty 500

## 2016-05-14 MED ORDER — ALUM & MAG HYDROXIDE-SIMETH 200-200-20 MG/5ML PO SUSP
30.0000 mL | ORAL | Status: DC | PRN
  Administered 2016-05-14: 30 mL via ORAL
  Filled 2016-05-14: qty 30

## 2016-05-14 MED ORDER — ALBUTEROL SULFATE (2.5 MG/3ML) 0.083% IN NEBU: 2.5000 mg | INHALATION_SOLUTION | RESPIRATORY_TRACT | Status: DC | PRN

## 2016-05-14 MED ORDER — ERYTHROMYCIN 5 MG/GM OP OINT
TOPICAL_OINTMENT | Freq: Four times a day (QID) | OPHTHALMIC | Status: DC
  Administered 2016-05-14: 1 via OPHTHALMIC
  Administered 2016-05-14 – 2016-05-15 (×2): via OPHTHALMIC
  Administered 2016-05-15 (×3): 1 via OPHTHALMIC
  Administered 2016-05-16 – 2016-05-20 (×16): via OPHTHALMIC
  Administered 2016-05-20: 1 via OPHTHALMIC
  Administered 2016-05-20 – 2016-05-22 (×4): via OPHTHALMIC
  Filled 2016-05-14: qty 3.5

## 2016-05-14 NOTE — Care Management Obs Status (Signed)
[facility] NOTIFICATION   Patient Details  Name: KWENTIN SINAGRA MRN: [id number] Date of Birth: 14-Apr-1951   Medicare Observation Status Notification Given:  Yes    Ninfa Meeker, RN 05/14/2016, 10:39 AM

## 2016-05-14 NOTE — Significant Event (Signed)
Rapid Response Event Note RN called for pt c/o chest pain and SOB  Overview: Time Called: 2032 Arrival Time: 2040 Event Type: Cardiac, Respiratory  Initial Focused Assessment: Upon arrival pt alert and oriented x4, skin warm and dry, no labored breathing or accessory appreciated. Baltazar Najjar NP paged pta with new orders placed and in process of being completed on arrival. Pt rated chest pain 10/10 prior to nitro, pain decreased after first dose pt did not give a number continued to say "much better" pt currently sleeping after 2nd dose of nitro. BP 105/53, HR 116, RR 16, 91% 2L   Interventions: Nitro Sl x2 given, EKG resulting in ST otherwise normal EKG, HR 102, Troponin pending, CXR resulting in pna, [place] NP to work up pna protocol Plan of Care (if not transferred): Monyca RN to monitor and call with any changes.  Event Summary: Name of Physician Notified: Baltazar Najjar NP  at  (PTA RRT )    at    Outcome: Stayed in room and stabalized  Event End Time: 2125  [place] Pickup

## 2016-05-14 NOTE — Progress Notes (Addendum)
Shift event: Earlier in shift, RN paged because pt was c/o SOB and CP. CP described as central sternal, pressure/heaviness, and 10/10. RN had placed 5L O2 per [place] for the SOB. Pt on RA earlier and did not drop O2 sat with this episode. EKG/CXR done. NTG x 1 given. EKG without acute changes. CXR shows opacity LLL, ? PNA. Zithromax added to abx regimen (on abx for cellulitis). Treating for CAP as pt has been here less than 24 hours and chart does not reveal hospitalization within 90 days.  About 30 mins later, NP called RN back and pt was no longer c/o pain or SOB. He was actually sleeping. Asked RN to wean O2 as tolerated and OK to leave on 2L for comfort.  Supportive care for PNA started as well.  1st troponin neg, will continue to cycle.  KJKG, NP [facility] Update: 2nd troponin neg. No further c/o chest pain. KJKG, NP [facility]

## 2016-05-14 NOTE — Progress Notes (Signed)
Patient ID: [name], male   DOB: 1950-12-02, 66 y.o.   MRN: [id number]    PROGRESS NOTE    [name]  [id number] DOB: June 08, 1950 DOA: 05/13/2016  PCP: [name], MD   Brief Narrative:   66 y.o. male with medical history significant for HIV, CLL, CAD status post CABG, rheumatoid arthritis, and degenerative disc disease with chronic low back pain status post posterior fusion, presenting to the emergency department with acute worsening of his chronic low back pain. Patient reports that he was in his usual state of health until 05/09/2016 when he experienced insidious worsening in his chronic low back pain. There was no precipitating event that he could recall, no fall or trauma, and no heavy lifting or increased exertion.  Assessment & Plan:   1. Acute on chronic low-back pain, intractable  - Pt with chronic LBP s/p fusion  - He has been immobilized with intractable pain for past 2 days - There was no precipitating event identified; no fevers or wt-loss reported; no incontinence or saddle paraesthesia  - CT L-spine with stable chronic changes; no mass, infection, or significant canal stenosis noted  - MRI L-spine with no specific acute finding to explain the cause  - neurosurgery on call paged, PT eval requested   2. Cellulitis, LLE  - Mild non-purulent cellulitis with penicillin allergy, started on empiric clindamycin and will continue same  - Underlying DVT excluded with venous dopplers, notable for left inguinal adenopathy   3. HIV  - CD4 was 1,140 and VL 31 in October 2017  - Continue current management with Prezista and Genvoya    4. Hx of recurrent DVT  - Continue Savaysa  - There is LLE edema, possibly related to post-thrombotic syndrome or pelvic and retroperitoneal adenopathy  - US performed and negative for DVT    5. CLL  - Pt continues to follow with oncology, not currently on any treatments  - CT abd/pelvis demonstrates a mild increase in  retroperitoneal and pelvic adenopathy  - outpatient follow up  6. CAD - No anginal complaints  - Continue ASA, Lipitor, Toprol    7. Hypertension  - on low end of normal  - monitor closely   8. Rheumatoid arthritis  - Continue prednisone 5 mg daily   9. Incidental lung nodule  - 5 mm LLL nodule noted on CT   DVT prophylaxis: Edoxaban Code Status: Full  Family Communication: Patient at bedside  Disposition Plan: Home in 1-2 days   Consultants:   None  Procedures:   None  Antimicrobials:   None  Subjective: No events overnight.   Objective: Vitals:   05/13/16 2124 05/13/16 2300 05/14/16 0553 05/14/16 1300  BP: (!) 144/77  127/71 122/64  Pulse: 90  88 88  Resp: 18  16 16   Temp: 99 F (37.2 C)  99 F (37.2 C) 99.3 F (37.4 C)  TempSrc: Oral  Oral Oral  SpO2: 97%  96% 95%  Weight:  91.3 kg (201 lb 4.5 oz)    Height:  5\' 5"  (1.651 m)      Intake/Output Summary (Last 24 hours) at 05/14/16 1645 Last data filed at 05/14/16 1300  Gross per 24 hour  Intake              450 ml  Output              650 ml  Net             -200  ml   Filed Weights   05/13/16 2300  Weight: 91.3 kg (201 lb 4.5 oz)    Examination:  General exam: Appears calm and comfortable  Respiratory system: Clear to auscultation. Respiratory effort normal. Cardiovascular system: S1 & S2 heard, RRR. No JVD, murmurs, rubs, gallops or clicks. No pedal edema. Gastrointestinal system: Abdomen is nondistended, soft and nontender. No organomegaly or masses felt. Normal bowel sounds heard. Central nervous system: Alert and oriented.   Data Reviewed: I have personally reviewed following labs and imaging studies  CBC:  Recent Labs Lab 05/13/16 1405  WBC 10.3  NEUTROABS 3.6  HGB 12.2*  HCT 37.4*  MCV 86.2  PLT 123XX123*   Basic Metabolic Panel:  Recent Labs Lab 05/13/16 1405 05/14/16 0647  NA 135 136  K 3.3* 3.5  CL 101 99*  CO2 27 25  GLUCOSE 86 74  BUN 15 12  CREATININE 0.94  0.93  CALCIUM 8.4* 8.3*   Liver Function Tests:  Recent Labs Lab 05/13/16 1405  AST 36  ALT 29  ALKPHOS 51  BILITOT 0.9  PROT 5.3*  ALBUMIN 3.1*    Recent Labs Lab 05/13/16 1405  LIPASE 12   CBG:  Recent Labs Lab 05/14/16 0653 05/14/16 0710  GLUCAP 64* 76   Urine analysis:    Component Value Date/Time   COLORURINE AMBER (A) 05/13/2016 1754   APPEARANCEUR CLEAR 05/13/2016 1754   LABSPEC >1.046 (H) 05/13/2016 1754   PHURINE 5.0 05/13/2016 1754   GLUCOSEU NEGATIVE 05/13/2016 1754   HGBUR NEGATIVE 05/13/2016 1754   BILIRUBINUR NEGATIVE 05/13/2016 1754   KETONESUR 20 (A) 05/13/2016 1754   PROTEINUR 30 (A) 05/13/2016 1754   UROBILINOGEN 1.0 01/17/2015 1218   NITRITE NEGATIVE 05/13/2016 1754   LEUKOCYTESUR NEGATIVE 05/13/2016 1754   Radiology Studies: Mr Lumbar Spine Wo Contrast  Result Date: 05/14/2016 CLINICAL DATA:  Severe low back pain and bilateral lower extremity weakness EXAM: MRI LUMBAR SPINE WITHOUT CONTRAST TECHNIQUE: Multiplanar, multisequence MR imaging of the lumbar spine was performed. No intravenous contrast was administered. COMPARISON:  Multiple prior CT examinations most recently yesterday. FINDINGS: Segmentation:  5 lumbar type vertebral bodies is in. Alignment: Curvature convex to the left the apex at L1-2. Kyphotic angulation at the L1-2 level. Vertebrae: There is fusion in the lower thoracic region to L1 with solid bridging osteophytes anteriorly. There is surgical fusion from L2 to the sacrum. L1-2 is the only mobile level. Conus medullaris: Extends to the L1-2 level and appears normal. Paraspinal and other soft tissues: Massive abdominal and pelvic lymphadenopathy as shown at CT. Disc levels: As noted above, there is fusion through the thoracic region to L1. The T11-12 and T12-L1 discs are normal. There is previous lumbosacral surgical fusion from L2 to the sacrum which appears solid. L1-2 is the only mobile level in there is pronounced degenerative  change at this level because of that. The disc is degenerated and there are large anterior projecting osteophytes. There is fluid in the disc space that is probably degenerative in nature. There do not appear to be inflammatory changes of the vertebral bodies or surrounding soft tissues to suggest that this is infectious. Posterior annular fibers bulge only minimally. There is some facet degeneration and hypertrophy but this is fairly mild. There is mild canal narrowing at this level without visible neural compression. IMPRESSION: There are solid bridging osteophytes in the lower thoracic region through L1 resulting in functional fusion. There is postsurgical fusion from L2 to the sacrum. L1-2 is the only  mobile level and there is pronounced degenerative spondylosis at this level with a somewhat unusual pattern of large anterior projecting osteophytes and fluid intensity material in the disc space. I think these changes are quite probably degenerative and I do not see any finding to suggest inflammatory spondyloarthropathy. Luckily, posterior annular fibers bulge only mildly and there is only mild hypertrophic facet change, therefore with only mild canal narrowing. Electronically Signed   By: Nelson Chimes M.D.   On: 05/14/2016 08:47   Ct Abdomen Pelvis W Contrast  Result Date: 05/13/2016 CLINICAL DATA:  Pt c/o left lower abd pain that radiates down left buttocks, nausea/vomiting Since Friday EXAM: CT ABDOMEN AND PELVIS WITH CONTRAST TECHNIQUE: Multidetector CT imaging of the abdomen and pelvis was performed using the standard protocol following bolus administration of intravenous contrast. CONTRAST:  135mL ISOVUE-300 IOPAMIDOL (ISOVUE-300) INJECTION 61% COMPARISON:  11/06/2015 FINDINGS: Lower chest: Lung base subsegmental atelectasis and/ or scarring. 5 mm nodule in the costophrenic sulcus at the lateral left lower lobe lung base. Calcified nodes along the inferior right hilum. Hepatobiliary: Multiple  calcifications consistent healed granuloma. No liver masses. Gallbladder surgically absent. No bile duct dilation Pancreas: Partial fatty replacement.  No mass or inflammation. Spleen: Mildly enlarged with multiple calcifications. Spleen measures 13.5 cm greatest dimension. It is stable from the prior study. Adrenals/Urinary Tract: No adrenal masses. No renal masses, no renal masses. Small nonobstructing stone in the lower pole the left kidney. Small nonobstructing stone in the upper pole the right kidney. No hydronephrosis. Ureters are normal course and in caliber. Bladder is unremarkable. Stomach/Bowel: Small hiatal hernia. No bowel obstruction. No bowel wall thickening. There are scattered colonic diverticula. Colon otherwise unremarkable. Appendix not visualized. Vascular/Lymphatic: There is bulky adenopathy. There are numerous enlarged retroperitoneal lymph nodes. Several reference measurements were made. There is in nodes the left of the aorta at the level of the lower pole the left kidney. It measures 2.6 cm in short axis, 2.1 cm previously. An aortocaval node measures 18 mm in short axis, previously 13 mm. Multiple other nodes appear larger than on the prior study. There is milder periceliac adenopathy largest node measuring 14 mm in short axis, previously 10 mm. There are also enlarged pelvic nodes, largest a left external iliac chain node measuring 18 mm in short axis unchanged from the prior study allowing for differences in measurement technique. A left common iliac chain node previously measured 11 mm, currently 13 mm. There are atherosclerotic calcifications along a normal caliber abdominal aorta. Reproductive: Unremarkable Other: No abdominal wall hernia.  No ascites. Musculoskeletal: Postsurgical changes from the L2 through S1 posterior lumbar spine fusion. Degenerative changes throughout the visualized spine. No osteoblastic or osteolytic lesions. IMPRESSION: 1. No acute findings. 2. Bulky  adenopathy most evident along the retroperitoneum and in the pelvis. This has mildly increased when compared to the most recent prior study. Based on the prior CT scan history, patient has HIV and chronic lymphocytic leukemia. 3. Mild splenomegaly, stable. 4. Changes from healed granulomatous disease, stable. 5. Small nodule at the base of the left lower lobe not evident on the prior CTs. This measures 5 mm. Electronically Signed   By: Lajean Manes M.D.   On: 05/13/2016 16:57   Ct L-spine No Charge  Result Date: 05/13/2016 CLINICAL DATA:  Back pain EXAM: CT LUMBAR SPINE WITHOUT CONTRAST TECHNIQUE: Multidetector CT imaging of the lumbar spine was performed without intravenous contrast administration. Multiplanar CT image reconstructions were also generated. COMPARISON:  CT abdomen pelvis 11/06/2015  FINDINGS: Segmentation: Normal segmentation.  Lowest disc space L5-S1 Alignment: Mild retrolisthesis L1-2. 21 degrees of levoscoliosis at L1-2 similar to the prior study. Vertebrae: Negative for fracture or mass. No evidence of spinal infection Paraspinal and other soft tissues: Paraspinous muscles show symmetric atrophy. No fluid collection or asymmetry. No retroperitoneal mass. Disc levels: T10-11: Mild ligamentum flavum calcification without significant spinal stenosis T12-L1:  Negative L1-2: Disc and facet degeneration. Diffuse disc bulging with moderate spinal stenosis. This is just above the fusion level. L2-3: Left L1 pedicle screw is been removed. Right L1 pedicle screw in good position. Bilateral L2 pedicle screws in good position. Interbody spacer in satisfactory position. Posterior decompression without significant stenosis. L3-4: Bilateral pedicle screw and interbody fusion. Posterior decompression without stenosis L4-5: Bilateral pedicle screw and posterior bony fusion. Bilateral laminectomy. No significant spinal stenosis L5-S1: Bilateral pedicle screw fusion. Right S1 screw crosses through the  subarticular zone and could cause impingement or injury to the right S1 nerve root. This is unchanged from the CT of 11/06/2015. No significant spinal stenosis. IMPRESSION: Negative for fracture.  Moderate levoscoliosis. Surgical fusion L2 through S1 similar to prior studies. Right S1 screw passes through the subarticular zone in could cause injury to the right S1 nerve root. This is unchanged. No significant spinal stenosis. No evidence of mass or spinal infection. Electronically Signed   By: Franchot Gallo M.D.   On: 05/13/2016 16:58    Scheduled Meds: . aspirin EC  81 mg Oral Daily  . atorvastatin  10 mg Oral QHS  . clindamycin  300 mg Oral Q6H  . Darunavir Ethanolate  800 mg Oral Q breakfast  . edoxaban  30 mg Oral Daily  . elvitegravir-cobicistat-emtricitabine-tenofovir  1 tablet Oral Q breakfast  . erythromycin   Both Eyes Q6H  . escitalopram  10 mg Oral Daily  . ketorolac  15 mg Intravenous Q6H  . loratadine  10 mg Oral Daily  . metoprolol succinate  25 mg Oral Daily  . pantoprazole  40 mg Oral BID AC  . predniSONE  5 mg Oral Q breakfast  . pregabalin  200 mg Oral BID  . saccharomyces boulardii  250 mg Oral BID  . senna-docusate  1 tablet Oral BID  . sodium chloride flush  3 mL Intravenous Q12H  . sulfamethoxazole-trimethoprim  1 tablet Oral Daily  . valACYclovir  500 mg Oral Daily   Continuous Infusions:   LOS: 0 days   Time spent: 20 minutes   Faye Ramsay, MD [facility] Pager [phone]  If 7PM-7AM, please contact night-coverage [email or web address] Password [email or web address] 05/14/2016, 4:45 PM

## 2016-05-14 NOTE — Progress Notes (Signed)
On assessment, both of pt's eyes are red and watering. Pt states they do itch. Dr. Doyle Askew notified and ordered erythromycin eye ointment. Pt's abdomen was also very distended and firm to touch. Pt given dulcolax PO and miralax, and Dr. Doyle Askew ordered senna as well. Will continue to monitor.

## 2016-05-15 ENCOUNTER — Encounter: Payer: Self-pay | Admitting: Neurology

## 2016-05-15 ENCOUNTER — Inpatient Hospital Stay (HOSPITAL_COMMUNITY): Payer: Medicare Other

## 2016-05-15 ENCOUNTER — Encounter (HOSPITAL_COMMUNITY): Payer: Self-pay | Admitting: General Practice

## 2016-05-15 LAB — CBC
HCT: 33.4 % — ABNORMAL LOW (ref 39.0–52.0)
Hemoglobin: 11.1 g/dL — ABNORMAL LOW (ref 13.0–17.0)
MCH: 28 pg (ref 26.0–34.0)
MCHC: 33.2 g/dL (ref 30.0–36.0)
MCV: 84.3 fL (ref 78.0–100.0)
Platelets: 114 10*3/uL — ABNORMAL LOW (ref 150–400)
RBC: 3.96 MIL/uL — ABNORMAL LOW (ref 4.22–5.81)
RDW: 15.5 % (ref 11.5–15.5)
WBC: 9.1 10*3/uL (ref 4.0–10.5)

## 2016-05-15 LAB — TROPONIN I
Troponin I: <0.03 ng/mL (ref <0.03)
Troponin I: <0.03 ng/mL (ref <0.03)

## 2016-05-15 LAB — BASIC METABOLIC PANEL
Anion gap: 6 (ref 5–15)
BUN: 11 mg/dL (ref 6–20)
CO2: 27 mmol/L (ref 22–32)
Calcium: 7.9 mg/dL — ABNORMAL LOW (ref 8.9–10.3)
Chloride: 96 mmol/L — ABNORMAL LOW (ref 101–111)
Creatinine, Ser: 0.96 mg/dL (ref 0.61–1.24)
GFR calc Af Amer: >60 mL/min (ref >60)
GFR calc non Af Amer: >60 mL/min (ref >60)
Glucose, Bld: 100 mg/dL — ABNORMAL HIGH (ref 65–99)
Potassium: 3.7 mmol/L (ref 3.5–5.1)
Sodium: 129 mmol/L — ABNORMAL LOW (ref 135–145)

## 2016-05-15 LAB — GLUCOSE, CAPILLARY: Glucose-Capillary: 87 mg/dL (ref 65–99)

## 2016-05-15 MED ORDER — HYDROMORPHONE HCL 2 MG PO TABS
2.0000 mg | ORAL_TABLET | ORAL | Status: DC | PRN
  Administered 2016-05-19 – 2016-05-24 (×12): 2 mg via ORAL
  Filled 2016-05-15 (×13): qty 1

## 2016-05-15 MED ORDER — HYDROMORPHONE HCL 2 MG/ML IJ SOLN
0.5000 mg | INTRAMUSCULAR | Status: DC | PRN
  Administered 2016-05-15 – 2016-05-20 (×2): 0.5 mg via INTRAVENOUS
  Filled 2016-05-15 (×2): qty 1

## 2016-05-15 NOTE — Progress Notes (Signed)
Patient ID: [name], male   DOB: 27-Apr-1951, 66 y.o.   MRN: [id number]    PROGRESS NOTE    [name]  [id number] DOB: May 30, 1950 DOA: 05/13/2016  PCP: [name], MD   Brief Narrative:   66 y.o. male with medical history significant for HIV, CLL, CAD status post CABG, rheumatoid arthritis, and degenerative disc disease with chronic low back pain status post posterior fusion, presenting to the emergency department with acute worsening of his chronic low back pain. Patient reports that he was in his usual state of health until 05/09/2016 when he experienced insidious worsening in his chronic low back pain. There was no precipitating event that he could recall, no fall or trauma, and no heavy lifting or increased exertion.  Assessment & Plan:   1. Acute on chronic low-back pain, intractable  - Pt with chronic LBP s/p fusion  - There was no precipitating event identified; no fevers or wt-loss reported; no incontinence or saddle paraesthesia  - CT L-spine with stable chronic changes; no mass, infection, or significant canal stenosis noted  - MRI L-spine with no specific acute finding to explain the cause  - d/w neurosurgery, no indication for an intervention  - PT eval requested   2. Cellulitis, LLE  - Mild non-purulent cellulitis with penicillin allergy, started on empiric clindamycin and will continue same  - Underlying DVT excluded with venous dopplers, notable for left inguinal adenopathy   3. HIV  - CD4 was 1,140 and VL 31 in October 2017  - Continue current management with Prezista and Genvoya    4. Hx of recurrent DVT  - Continue Savaysa  - There is LLE edema, possibly related to post-thrombotic syndrome or pelvic and retroperitoneal adenopathy  - US performed and negative for DVT    5. CLL  - Pt continues to follow with oncology, not currently on any treatments  - CT abd/pelvis demonstrates a mild increase in retroperitoneal and pelvic adenopathy  -  outpatient follow up  6. CAD - No anginal complaints  - Continue ASA, Lipitor, Toprol    7. Hypertension, essential  - on low end of normal  - monitor closely   8. Rheumatoid arthritis  - Continue prednisone 5 mg daily   9. Incidental lung nodule  - 5 mm LLL nodule noted on CT   10. LLL PNA - Zithromax added 1/3   11. Abd distension - ileus vs pSBO - placed on bowel regimen  - may need to switch to clear liquids   12. Hyponatremia - will repeat BMP in AM  DVT prophylaxis: Edoxaban Code Status: Full  Family Communication: Patient at bedside  Disposition Plan: Home in 1-2 days   Consultants:   Neurosurgery over the phone  Procedures:   None  Antimicrobials:   Clindamycin 1/2 -->  Zithromax 1/3 -->  Subjective: Had some chest pain, LLL PNA noted on CXR.   Objective: Vitals:   05/15/16 0205 05/15/16 0214 05/15/16 0407 05/15/16 1300  BP: (!) 88/46 (!) 108/55 (!) 109/52 108/67  Pulse: 84 88 83 87  Resp:   16 16  Temp:   98.5 F (36.9 C) 98.2 F (36.8 C)  TempSrc:   Oral Oral  SpO2:   96% 95%  Weight:      Height:        Intake/Output Summary (Last 24 hours) at 05/15/16 1900 Last data filed at 05/15/16 0943  Gross per 24 hour  Intake  800 ml  Output                0 ml  Net              800 ml   Filed Weights   05/13/16 2300  Weight: 91.3 kg (201 lb 4.5 oz)    Examination:  General exam: Appears calm and comfortable  Respiratory system: Clear to auscultation. Respiratory effort normal. Cardiovascular system: S1 & S2 heard, RRR. No JVD, murmurs, rubs, gallops or clicks. No pedal edema. Gastrointestinal system: Abdomen is diistended, soft and nontender. No organomegaly or masses felt.  Central nervous system: Alert and oriented.   Data Reviewed: I have personally reviewed following labs and imaging studies  CBC:  Recent Labs Lab 05/13/16 1405 05/15/16 0831  WBC 10.3 9.1  NEUTROABS 3.6  --   HGB 12.2* 11.1*  HCT  37.4* 33.4*  MCV 86.2 84.3  PLT 117* [id number]*   Basic Metabolic Panel:  Recent Labs Lab 05/13/16 1405 05/14/16 0647 05/15/16 0831  NA 135 136 129*  K 3.3* 3.5 3.7  CL 101 99* 96*  CO2 27 25 27   GLUCOSE 86 74 100*  BUN 15 12 11   CREATININE 0.94 0.93 0.96  CALCIUM 8.4* 8.3* 7.9*   Liver Function Tests:  Recent Labs Lab 05/13/16 1405  AST 36  ALT 29  ALKPHOS 51  BILITOT 0.9  PROT 5.3*  ALBUMIN 3.1*    Recent Labs Lab 05/13/16 1405  LIPASE 12   CBG:  Recent Labs Lab 05/14/16 0653 05/14/16 0710 05/15/16 0626  GLUCAP 64* 76 87   Urine analysis:    Component Value Date/Time   COLORURINE AMBER (A) 05/13/2016 1754   APPEARANCEUR CLEAR 05/13/2016 1754   LABSPEC >1.046 (H) 05/13/2016 1754   PHURINE 5.0 05/13/2016 1754   GLUCOSEU NEGATIVE 05/13/2016 1754   HGBUR NEGATIVE 05/13/2016 1754   BILIRUBINUR NEGATIVE 05/13/2016 1754   KETONESUR 20 (A) 05/13/2016 1754   PROTEINUR 30 (A) 05/13/2016 1754   UROBILINOGEN 1.0 01/17/2015 1218   NITRITE NEGATIVE 05/13/2016 1754   LEUKOCYTESUR NEGATIVE 05/13/2016 1754   Radiology Studies: Mr Lumbar Spine Wo Contrast  Result Date: 05/14/2016 CLINICAL DATA:  Severe low back pain and bilateral lower extremity weakness EXAM: MRI LUMBAR SPINE WITHOUT CONTRAST TECHNIQUE: Multiplanar, multisequence MR imaging of the lumbar spine was performed. No intravenous contrast was administered. COMPARISON:  Multiple prior CT examinations most recently yesterday. FINDINGS: Segmentation:  5 lumbar type vertebral bodies is in. Alignment: Curvature convex to the left the apex at L1-2. Kyphotic angulation at the L1-2 level. Vertebrae: There is fusion in the lower thoracic region to L1 with solid bridging osteophytes anteriorly. There is surgical fusion from L2 to the sacrum. L1-2 is the only mobile level. Conus medullaris: Extends to the L1-2 level and appears normal. Paraspinal and other soft tissues: Massive abdominal and pelvic lymphadenopathy as shown  at CT. Disc levels: As noted above, there is fusion through the thoracic region to L1. The T11-12 and T12-L1 discs are normal. There is previous lumbosacral surgical fusion from L2 to the sacrum which appears solid. L1-2 is the only mobile level in there is pronounced degenerative change at this level because of that. The disc is degenerated and there are large anterior projecting osteophytes. There is fluid in the disc space that is probably degenerative in nature. There do not appear to be inflammatory changes of the vertebral bodies or surrounding soft tissues to suggest that this is infectious. Posterior annular fibers  bulge only minimally. There is some facet degeneration and hypertrophy but this is fairly mild. There is mild canal narrowing at this level without visible neural compression. IMPRESSION: There are solid bridging osteophytes in the lower thoracic region through L1 resulting in functional fusion. There is postsurgical fusion from L2 to the sacrum. L1-2 is the only mobile level and there is pronounced degenerative spondylosis at this level with a somewhat unusual pattern of large anterior projecting osteophytes and fluid intensity material in the disc space. I think these changes are quite probably degenerative and I do not see any finding to suggest inflammatory spondyloarthropathy. Luckily, posterior annular fibers bulge only mildly and there is only mild hypertrophic facet change, therefore with only mild canal narrowing. Electronically Signed   By: Nelson Chimes M.D.   On: 05/14/2016 08:47   Dg Chest Port 1 View  Result Date: 05/14/2016 CLINICAL DATA:  Chest pain and shortness of breath EXAM: PORTABLE CHEST 1 VIEW COMPARISON:  Chest radiograph November 07, 2015 and chest CT January 24, 2016 FINDINGS: There is mild patchy infiltrate in the left base. Lungs elsewhere clear. Heart size and pulmonary vascularity are normal. No adenopathy. Patient is status post internal mammary bypass grafting. No  adenopathy. No bone lesions. IMPRESSION: Smaller of infiltrate left base, most likely pneumonia. Lungs elsewhere clear. Followup PA and lateral chest radiographs recommended in 3-4 weeks following trial of antibiotic therapy to ensure resolution and exclude underlying malignancy. Electronically Signed   By: Lowella Grip III M.D.   On: 05/14/2016 21:05   Dg Abd 2 Views  Result Date: 05/15/2016 CLINICAL DATA:  Constipation. EXAM: ABDOMEN - 2 VIEW COMPARISON:  CT 05/14/2015. FINDINGS: Limited exam due to positioning. Soft tissue structures are unremarkable. Several loops of slightly dilated air-filled small bowel noted. Adynamic ileus and/or partial small bowel obstruction cannot be completely excluded. Follow-up abdominal series suggested . Colonic gas pattern is normal. Mild amount stool noted throughout colon . Surgical clips right upper quadrant. Surgical clips noted over the mid abdomen. Aortoiliac atherosclerotic vascular calcification. Prior lumbar spine fusion. IMPRESSION: 1. Several air-filled loops of slightly dilated small bowel noted. Adynamic ileus or partial small bowel obstruction cannot be completely excluded. Colonic gas pattern is normal with mild amount of stool noted throughout colon. No free air. Follow-up abdominal series suggested for further evaluation. 2. Aortoiliac atherosclerotic vascular disease. Electronically Signed   By: Marcello Moores  Register   On: 05/15/2016 10:56    Scheduled Meds: . aspirin EC  81 mg Oral Daily  . atorvastatin  10 mg Oral QHS  . azithromycin  500 mg Intravenous Q24H  . clindamycin  300 mg Oral Q6H  . Darunavir Ethanolate  800 mg Oral Q breakfast  . edoxaban  30 mg Oral Daily  . elvitegravir-cobicistat-emtricitabine-tenofovir  1 tablet Oral Q breakfast  . erythromycin   Both Eyes Q6H  . escitalopram  10 mg Oral Daily  . guaiFENesin  600 mg Oral BID  . ketorolac  15 mg Intravenous Q6H  . loratadine  10 mg Oral Daily  . metoprolol succinate  25 mg Oral  Daily  . pantoprazole  40 mg Oral BID AC  . predniSONE  5 mg Oral Q breakfast  . pregabalin  200 mg Oral BID  . saccharomyces boulardii  250 mg Oral BID  . senna-docusate  1 tablet Oral BID  . sodium chloride flush  3 mL Intravenous Q12H  . sulfamethoxazole-trimethoprim  1 tablet Oral Daily  . valACYclovir  500 mg Oral Daily  Continuous Infusions:   LOS: 1 day   Time spent: 20 minutes   Faye Ramsay, MD [facility] Pager [phone]  If 7PM-7AM, please contact night-coverage [email or web address] Password [email or web address] 05/15/2016, 7:00 PM

## 2016-05-15 NOTE — Progress Notes (Signed)
Patient a/ox4 in no distress or complaining of any pain or issues during beginning of shift. 0825 patient called concerning his lasix he takes at home and soon after called back complaining of SOB. Patient appear to be in distress. Struggling to breathe and stating he feels like his lungs are filling up with fluid. 2040 VS was 154/66, 95%5L, 105. Stated pain 10/10 with crushing chest pain to mid chest. 02 was applied via nasal cannula. EKG and PCXR done. Rapid Response RN was paged and on call physician was notified. 2046 VS 148/71, 94%5L, 102. Nitro was givenx2. VS 105/43,91%2L,116. Soon after Patient has fallen asleep. On 2L 02 [place] sats 100%. Woke patient up to reassess and patient is comfortable and no longer having any chest pains or sob. Currently still on 2L 02 for comfort. Will continue to monitor patient.

## 2016-05-15 NOTE — Evaluation (Signed)
Physical Therapy Evaluation Patient Details Name: [name] MRN: [id number] DOB: Apr 12, 1951 Today's Date: 05/15/2016   History of Present Illness  66 y.o. male admitted to [facility] on 05/13/16 for acute worsening of his chronic low back pain.  Pt dx with acute on chronic low back pain, neurosurgery does not feel surgical intervention is appropriate.  Pt also found to have cellulitis L LE, PNA, and abdominal distention (ileum vs partial SBO).  Pt with significant PMHx of tubular adenoma of the colon, TIA, dilation of esophageal narrowing, RA, oil TKA, HTN, MI, neuropathy, hepatitis A, HIV, gout, fibromyalgia, DVT L LE (chronic), CAD, CLL, chronic back pain (s/p multiple spine surgeries), bil shoulder surgery, and CABG.  Clinical Impression  Pt with significant acute on chronic low back pain and spasm.  Pt unable to walk except flexed forward 90 degrees with elbows resting on RW.  He was active with OP PT and is concerned that this is why his back got worse.  He, despite his flexed posture, is still able to function at his home at a mod I level.  He drives and gets his own groceries.  Pt ambulated on RA and O2 sats were 91% on RA with no significant DOE during mobility.   PT to follow acutely for deficits listed below.       Follow Up Recommendations No PT follow up;Other (comment)    Equipment Recommendations  None recommended by PT    Recommendations for Other Services   NA    Precautions / Restrictions Precautions Precautions: Fall      Mobility  Bed Mobility Overal bed mobility: Needs Assistance Bed Mobility: Rolling;Sidelying to Sit;Sit to Sidelying Rolling: Modified independent (Device/Increase time) Sidelying to sit: Modified independent (Device/Increase time)     Sit to sidelying: Min assist General bed mobility comments: Pt able to get to EOB without assistance from his side.  He needed help to lift both legs back into bed to get to sidelying.    Transfers Overall transfer  level: Needs assistance Equipment used: Rolling walker (2 wheeled) Transfers: Sit to/from Stand Sit to Stand: Min guard         General transfer comment: Min guard assist for safety  Ambulation/Gait Ambulation/Gait assistance: Min guard Ambulation Distance (Feet): 75 Feet Assistive device: Rolling walker (2 wheeled) Gait Pattern/deviations: Step-through pattern;Trunk flexed Gait velocity: decreased Gait velocity interpretation: Below normal speed for age/gender General Gait Details: Pt is flexed at a near 90 degree angle at the trunk and hips leaning his elbows on the RW handles during gait.          Balance Overall balance assessment: Needs assistance Sitting-balance support: Feet supported;No upper extremity supported;Bilateral upper extremity supported Sitting balance-[name]: Fair     Standing balance support: Bilateral upper extremity supported Standing balance-[name]: Poor                               Pertinent Vitals/Pain Pain Assessment: Faces Faces Pain Scale: Hurts whole lot Pain Location: low back Pain Descriptors / Indicators: Grimacing;Guarding;Spasm Pain Intervention(s): Limited activity within patient's tolerance;Monitored during session;Repositioned    Home Living Family/patient expects to be discharged to:: Private residence Living Arrangements: Alone Available Help at Discharge: Friend(s) Type of Home: Mobile home Home Access: Ramped entrance     Home Layout: One level Home Equipment: Environmental consultant - 4 wheels;Shower seat Additional Comments: Pt is active in OP PT at [employer]    Prior  Function Level of Independence: Independent with assistive device(s)         Comments: Walks with rollator, drives        Extremity/Trunk Assessment   Upper Extremity Assessment Upper Extremity Assessment: Overall WFL for tasks assessed    Lower Extremity Assessment Lower Extremity Assessment: LLE deficits/detail;RLE  deficits/detail LLE Deficits / Details: pt reports left leg is weaker and has more readiating pain.  both legs have poor sensation, and pt reports left leg often gives out on him.     Cervical / Trunk Assessment Cervical / Trunk Assessment: Other exceptions Cervical / Trunk Exceptions: In standing pt is forward flexed onto his elbows on his RW because this is the only way he can stand and walk with less pain  Communication   Communication: No difficulties  Cognition Arousal/Alertness: Awake/alert Behavior During Therapy: WFL for tasks assessed/performed Overall Cognitive Status: Within Functional Limits for tasks assessed                      General Comments General comments (skin integrity, edema, etc.): Pt reports he was working at OP PT on standing up straight for a period of time both with and without his arms supporting him, he was practicing this at home daily while he makes coffee.  He has been doing this for a week and reports that this may have triggered his pain and spasms         Assessment/Plan    PT Assessment Patient needs continued PT services  PT Problem List Decreased strength;Decreased range of motion;Decreased activity tolerance;Decreased balance;Decreased mobility;Pain;Decreased knowledge of use of DME;Impaired sensation          PT Treatment Interventions DME instruction;Gait training;Functional mobility training;Therapeutic activities;Therapeutic exercise;Balance training;Patient/family education    PT Goals (Current goals can be found in the Care Plan section)  Acute Rehab PT Goals Patient Stated Goal: to decrease pain and maintain or improve his mobility PT Goal Formulation: With patient Time For Goal Achievement: 05/29/16 Potential to Achieve Goals: Fair    Frequency Min 3X/week   Barriers to discharge Decreased caregiver support         End of Session Equipment Utilized During Treatment: Gait belt Activity Tolerance: Patient limited by  pain;Patient limited by fatigue Patient left: in bed;with call bell/phone within reach           Time: 1713-1730 PT Time Calculation (min) (ACUTE ONLY): 17 min   Charges:   PT Evaluation $PT Eval Moderate Complexity: 1 Procedure          Callum Wolf B. [place], [place], DPT [phone]   05/15/2016, 11:10 PM

## 2016-05-16 ENCOUNTER — Inpatient Hospital Stay (HOSPITAL_COMMUNITY): Payer: Medicare Other

## 2016-05-16 LAB — CBC
HCT: 34.9 % — ABNORMAL LOW (ref 39.0–52.0)
Hemoglobin: 11.4 g/dL — ABNORMAL LOW (ref 13.0–17.0)
MCH: 27.8 pg (ref 26.0–34.0)
MCHC: 32.7 g/dL (ref 30.0–36.0)
MCV: 85.1 fL (ref 78.0–100.0)
Platelets: 118 10*3/uL — ABNORMAL LOW (ref 150–400)
RBC: 4.1 MIL/uL — ABNORMAL LOW (ref 4.22–5.81)
RDW: 15.8 % — ABNORMAL HIGH (ref 11.5–15.5)
WBC: 7.6 10*3/uL (ref 4.0–10.5)

## 2016-05-16 LAB — BASIC METABOLIC PANEL
Anion gap: 8 (ref 5–15)
BUN: 7 mg/dL (ref 6–20)
CO2: 26 mmol/L (ref 22–32)
Calcium: 8.1 mg/dL — ABNORMAL LOW (ref 8.9–10.3)
Chloride: 100 mmol/L — ABNORMAL LOW (ref 101–111)
Creatinine, Ser: 0.82 mg/dL (ref 0.61–1.24)
GFR calc Af Amer: >60 mL/min (ref >60)
GFR calc non Af Amer: >60 mL/min (ref >60)
Glucose, Bld: 129 mg/dL — ABNORMAL HIGH (ref 65–99)
Potassium: 3.4 mmol/L — ABNORMAL LOW (ref 3.5–5.1)
Sodium: 134 mmol/L — ABNORMAL LOW (ref 135–145)

## 2016-05-16 LAB — GLUCOSE, CAPILLARY: Glucose-Capillary: 101 mg/dL — ABNORMAL HIGH (ref 65–99)

## 2016-05-16 MED ORDER — POTASSIUM CHLORIDE CRYS ER 20 MEQ PO TBCR
40.0000 meq | EXTENDED_RELEASE_TABLET | Freq: Once | ORAL | Status: AC
  Administered 2016-05-16: 40 meq via ORAL
  Filled 2016-05-16: qty 2

## 2016-05-16 MED ORDER — AZITHROMYCIN 500 MG PO TABS
500.0000 mg | ORAL_TABLET | ORAL | Status: DC
  Administered 2016-05-16 – 2016-05-18 (×3): 500 mg via ORAL
  Filled 2016-05-16 (×4): qty 1

## 2016-05-16 MED ORDER — BISACODYL 10 MG RE SUPP
10.0000 mg | Freq: Once | RECTAL | Status: AC
  Administered 2016-05-16: 10 mg via RECTAL
  Filled 2016-05-16: qty 1

## 2016-05-16 NOTE — Progress Notes (Signed)
Patient ID: [name], male   DOB: 11/20/1950, 66 y.o.   MRN: [id number]    PROGRESS NOTE    [name]  [id number] DOB: 03/01/1951 DOA: 05/13/2016  PCP: [name], MD   Brief Narrative:   66 y.o. male with medical history significant for HIV, CLL, CAD status post CABG, rheumatoid arthritis, and degenerative disc disease with chronic low back pain status post posterior fusion, presenting to the emergency department with acute worsening of his chronic low back pain. Patient reports that he was in his usual state of health until 05/09/2016 when he experienced insidious worsening in his chronic low back pain. There was no precipitating event that he could recall, no fall or trauma, and no heavy lifting or increased exertion.  Assessment & Plan:   1. Acute on chronic low-back pain, intractable  - Pt with chronic LBP s/p fusion  - There was no precipitating event identified; no fevers or wt-loss reported; no incontinence or saddle paraesthesia  - CT L-spine with stable chronic changes; no mass, infection, or significant canal stenosis noted  - MRI L-spine with no specific acute finding to explain the cause  - d/w neurosurgery, no indication for an intervention   2. Cellulitis, LLE  - Mild non-purulent cellulitis with penicillin allergy, started on empiric clindamycin and will continue same  - Underlying DVT excluded with venous dopplers, notable for left inguinal adenopathy   3. HIV  - CD4 was 1,140 and VL 31 in October 2017  - Continue current management with Prezista and Genvoya    4. Hx of recurrent DVT  - Continue Savaysa  - There is LLE edema, possibly related to post-thrombotic syndrome or pelvic and retroperitoneal adenopathy  - US performed and negative for DVT    5. CLL  - Pt continues to follow with oncology, not currently on any treatments  - CT abd/pelvis demonstrates a mild increase in retroperitoneal and pelvic adenopathy  - outpatient follow up  6.  CAD - No anginal complaints  - Continue ASA, Lipitor, Toprol    7. Hypertension, essential  - stable this AM 121/63 mmHg  8. Rheumatoid arthritis  - Continue prednisone 5 mg daily   9. Incidental lung nodule  - 5 mm LLL nodule noted on CT   10. LLL PNA - Zithromax added 1/3, continue same regimen   11. Abd distension - ileus vs pSBO - placed on bowel regimen, add suppository this am and if not working, may need enema  - switch to clear liquids   12. Hyponatremia - appears to be pre renal  - improving - BMP In AM  13. Right testicular swelling - testicular US will be ordered, will follow up on results   14. Thrombocytopenia - chronic  - overall stable with no signs of active bleeding   15. Obesity  - Body mass index is 33.49 kg/m  16. Hypokalemia - mild, supplement and repeat BMP in AM   DVT prophylaxis: Edoxaban Code Status: Full  Family Communication: Patient at bedside  Disposition Plan: Home in 1-2 days   Consultants:   Neurosurgery over the phone  Surgery   Procedures:   None  Antimicrobials:   Clindamycin 1/2 -->  Zithromax 1/3 -->  Subjective: Persistent back pain and abd distension.   Objective: Vitals:   05/15/16 0407 05/15/16 1300 05/15/16 1955 05/16/16 0418  BP: (!) 109/52 108/67 124/64 121/63  Pulse: 83 87 86 78  Resp: 16 16 16 16   Temp: 98.5 F (  36.9 C) 98.2 F (36.8 C) 98.6 F (37 C) 98.3 F (36.8 C)  TempSrc: Oral Oral Oral Oral  SpO2: 96% 95% 100% 99%  Weight:      Height:        Intake/Output Summary (Last 24 hours) at 05/16/16 1138 Last data filed at 05/16/16 0900  Gross per 24 hour  Intake              240 ml  Output                0 ml  Net              240 ml   Filed Weights   05/13/16 2300  Weight: 91.3 kg (201 lb 4.5 oz)    Examination:  General exam: Appears calm and comfortable  Respiratory system: Clear to auscultation. Respiratory effort normal. Mild rhonchi at bases. Cardiovascular system:  S1 & S2 heard, RRR. No JVD, murmurs, rubs, gallops or clicks. No pedal edema. Gastrointestinal system: Abdomen is diistended, soft and nontender. No organomegaly or masses felt.  Central nervous system: Alert and oriented.   Data Reviewed: I have personally reviewed following labs and imaging studies  CBC:  Recent Labs Lab 05/13/16 1405 05/15/16 0831 05/16/16 0813  WBC 10.3 9.1 7.6  NEUTROABS 3.6  --   --   HGB 12.2* 11.1* 11.4*  HCT 37.4* 33.4* 34.9*  MCV 86.2 84.3 85.1  PLT 117* 114* [id number]*   Basic Metabolic Panel:  Recent Labs Lab 05/13/16 1405 05/14/16 0647 05/15/16 0831 05/16/16 0813  NA 135 136 129* 134*  K 3.3* 3.5 3.7 3.4*  CL 101 99* 96* 100*  CO2 27 25 27 26   GLUCOSE 86 74 100* 129*  BUN 15 12 11 7   CREATININE 0.94 0.93 0.96 0.82  CALCIUM 8.4* 8.3* 7.9* 8.1*   Liver Function Tests:  Recent Labs Lab 05/13/16 1405  AST 36  ALT 29  ALKPHOS 51  BILITOT 0.9  PROT 5.3*  ALBUMIN 3.1*    Recent Labs Lab 05/13/16 1405  LIPASE 12   CBG:  Recent Labs Lab 05/14/16 0653 05/14/16 0710 05/15/16 0626 05/16/16 0633  GLUCAP 64* 76 87 101*   Urine analysis:    Component Value Date/Time   COLORURINE AMBER (A) 05/13/2016 1754   APPEARANCEUR CLEAR 05/13/2016 1754   LABSPEC >1.046 (H) 05/13/2016 1754   PHURINE 5.0 05/13/2016 1754   GLUCOSEU NEGATIVE 05/13/2016 1754   HGBUR NEGATIVE 05/13/2016 1754   BILIRUBINUR NEGATIVE 05/13/2016 1754   KETONESUR 20 (A) 05/13/2016 1754   PROTEINUR 30 (A) 05/13/2016 1754   UROBILINOGEN 1.0 01/17/2015 1218   NITRITE NEGATIVE 05/13/2016 1754   LEUKOCYTESUR NEGATIVE 05/13/2016 1754   Radiology Studies: Dg Chest Port 1 View  Result Date: 05/14/2016 CLINICAL DATA:  Chest pain and shortness of breath EXAM: PORTABLE CHEST 1 VIEW COMPARISON:  Chest radiograph November 07, 2015 and chest CT January 24, 2016 FINDINGS: There is mild patchy infiltrate in the left base. Lungs elsewhere clear. Heart size and pulmonary vascularity  are normal. No adenopathy. Patient is status post internal mammary bypass grafting. No adenopathy. No bone lesions. IMPRESSION: Smaller of infiltrate left base, most likely pneumonia. Lungs elsewhere clear. Followup PA and lateral chest radiographs recommended in 3-4 weeks following trial of antibiotic therapy to ensure resolution and exclude underlying malignancy. Electronically Signed   By: Lowella Grip III M.D.   On: 05/14/2016 21:05   Dg Abd 2 Views  Result Date: 05/15/2016 CLINICAL DATA:  Constipation. EXAM: ABDOMEN - 2 VIEW COMPARISON:  CT 05/14/2015. FINDINGS: Limited exam due to positioning. Soft tissue structures are unremarkable. Several loops of slightly dilated air-filled small bowel noted. Adynamic ileus and/or partial small bowel obstruction cannot be completely excluded. Follow-up abdominal series suggested . Colonic gas pattern is normal. Mild amount stool noted throughout colon . Surgical clips right upper quadrant. Surgical clips noted over the mid abdomen. Aortoiliac atherosclerotic vascular calcification. Prior lumbar spine fusion. IMPRESSION: 1. Several air-filled loops of slightly dilated small bowel noted. Adynamic ileus or partial small bowel obstruction cannot be completely excluded. Colonic gas pattern is normal with mild amount of stool noted throughout colon. No free air. Follow-up abdominal series suggested for further evaluation. 2. Aortoiliac atherosclerotic vascular disease. Electronically Signed   By: Marcello Moores  Register   On: 05/15/2016 10:56    Scheduled Meds: . aspirin EC  81 mg Oral Daily  . atorvastatin  10 mg Oral QHS  . azithromycin  500 mg Intravenous Q24H  . clindamycin  300 mg Oral Q6H  . Darunavir Ethanolate  800 mg Oral Q breakfast  . edoxaban  30 mg Oral Daily  . elvitegravir-cobicistat-emtricitabine-tenofovir  1 tablet Oral Q breakfast  . erythromycin   Both Eyes Q6H  . escitalopram  10 mg Oral Daily  . guaiFENesin  600 mg Oral BID  . ketorolac  15 mg  Intravenous Q6H  . loratadine  10 mg Oral Daily  . metoprolol succinate  25 mg Oral Daily  . pantoprazole  40 mg Oral BID AC  . predniSONE  5 mg Oral Q breakfast  . pregabalin  200 mg Oral BID  . saccharomyces boulardii  250 mg Oral BID  . senna-docusate  1 tablet Oral BID  . sodium chloride flush  3 mL Intravenous Q12H  . sulfamethoxazole-trimethoprim  1 tablet Oral Daily  . valACYclovir  500 mg Oral Daily   Continuous Infusions:   LOS: 2 days   Time spent: 20 minutes   Faye Ramsay, MD [facility] Pager [phone]  If 7PM-7AM, please contact night-coverage [email or web address] Password [email or web address] 05/16/2016, 11:38 AM

## 2016-05-16 NOTE — Progress Notes (Addendum)
Patient's daughter, [name], states she will use the work "tigers" when she calls for information.

## 2016-05-16 NOTE — Progress Notes (Signed)
Pt up to [facility] and had a large soft BM vith a void. Pt states that his stomach feels better right now. Pt declines enema at this time and will see how he feels in the am he states.

## 2016-05-16 NOTE — Consult Note (Signed)
[facility] Surgery Consult/Admission Note  [name] 1950/12/19  [id number].    Requesting MD: Dr. Mart Piggs Chief Complaint/Reason for Consult: Abdominal pain and distention  HPI:  [name] is a 66 y.o. male with medical history significant for HIV, CLL, diverticulosis, CAD status post CABG, rheumatoid arthritis, and degenerative disc disease with chronic low back pain status post posterior fusion, who presented to the emergency department on 05/13/16 with acute worsening of his chronic low back pain and left lower quadrant abdominal pain.   Subjective: Currently, pt is complaining of progressively worsening lower abdominal pain with associated distention and constipation for the last 2 days. Pain is non radiating, severe, 8/10, constant and nothing makes it better. Pt states the distention improved after a lot of belching and passing of gas yesterday. Pt states he has not had a BM in 6 days. He had some nausea 4 days ago but this has since resolved. Pt states he is having flatus. He is tolerating his diet although he is not eating much. He denies nausea, vomiting, blood in his stools, melena, hematuria, fever, chills, headache, LOC.  Pt is also complaining of right testicular swelling and left testicular pain. This is new for him. He states the pain in his left testicle is different then when he had a hernia. He describes it as a constant dull pain as if he had "sat' on his testicle. He denies pain in his right testicle.    ED Course: afebrile, VSS CT of the lumbar spine is negative for acute findings, mass, infectious process, or significant spinal stenosis, but notable for unchanged chronic degeneration and postsurgical findings.  CT the abdomen and pelvis is negative for acute finding, but notable for mild increase in his bulky adenopathy in the retroperitoneum and pelvis, likely secondary to CLL.  Surgical History: hiatal hernia repair, cholecystectomy, bilateral inguinal  hernia repair, umbilical hernia repair.  ROS:  Review of Systems  Constitutional: Negative for chills, diaphoresis, fever and weight loss.  HENT: Negative for sore throat.   Eyes: Negative for pain and discharge.  Respiratory: Negative for shortness of breath.   Cardiovascular: Negative for chest pain.  Gastrointestinal: Positive for abdominal pain, constipation and nausea. Negative for blood in stool, diarrhea, melena and vomiting.  Genitourinary: Negative for dysuria and hematuria.       + for Right testicular swelling, left testicular pain  Musculoskeletal: Positive for back pain.  Skin: Negative for rash.  Neurological: Negative for dizziness, loss of consciousness, weakness and headaches.  All other systems reviewed and are negative.    Family History  Problem Relation Age of Onset  . Breast cancer Mother   . Hypertension Mother   . Hyperlipidemia Mother   . Diabetes Mother   . Prostate cancer Father   . Colon polyps Father   . Hyperlipidemia Father   . Crohn's disease Paternal Aunt   . Diabetes Maternal Grandmother   . Diabetes Brother     x 3  . Heart disease Brother     x 3  . Hyperlipidemia Brother     x 3  . Alcohol abuse Daughter   . Drug abuse Daughter   . Asthma Brother   . Colon cancer Neg Hx     Past Medical History:  Diagnosis Date  . Carotid artery occlusion    40-60% right ICA stenosis (09/2008)  . Chronic back pain    follows with Nsurg  . Chronic back pain   . CLL (chronic  lymphoblastic leukemia) dx 2010   Followed at [facility] q90mo no current therapy   . Coronary artery disease 2010   s/p CABG '10, sees Dr. HPercival Spanish . Diverticulosis   . D[place], [place] extremity, [place]   LLE, chronic anticoag since 2009  . Esophagitis   . Fibromyalgia   . Gallstones   . GERD (gastroesophageal reflux disease)   . Gout   . Gynecomastia, male   . H/O hiatal hernia 2008   surgery  . Hemorrhoids   . Hepatitis A yrs ago  . [place]  [place]  . Hypertension   . Impotence of organic origin   . Myocardial infarction 2010    x 2  . Neuromuscular disorder (HCC)    neuropathy  . Osteoarthritis, knee    s/p B TKA  . Pneumonia mrach, may, july 2017  . Rheumatoid arthritis(714.0) dx 2010   MTX, follows with rheum  . Seasonal allergies   . Secondary syphilis 07/24/14 dx   s/p 2 wks doxy  . Status post dilation of esophageal narrowing   . TIA (transient ischemic attack) 1997   mild residual L mouth droop  . Tubular adenoma of colon     Past Surgical History:  Procedure Laterality Date  . CHOLECYSTECTOMY    . COLONOSCOPY WITH PROPOFOL N/A 12/28/2012   Procedure: COLONOSCOPY WITH PROPOFOL;  Surgeon: JJerene Bears MD;  Location: [facility];  Service: Gastroenterology;  Laterality: N/A;  . CORONARY ARTERY BYPASS GRAFT  2010   triple bypass  . ESOPHAGOGASTRODUODENOSCOPY (EGD) WITH PROPOFOL N/A 12/28/2012   Procedure: ESOPHAGOGASTRODUODENOSCOPY (EGD) WITH PROPOFOL;  Surgeon: JJerene Bears MD;  [facility]: [facility];  Service: Gastroenterology;  Laterality: N/A;  . ESOPHAGOGASTRODUODENOSCOPY (EGD) WITH PROPOFOL N/A 03/15/2013   Procedure: ESOPHAGOGASTRODUODENOSCOPY (EGD) WITH PROPOFOL;  Surgeon: JJerene Bears MD;  Location: [facility];  Service: Gastroenterology;  Laterality: N/A;  . ESOPHAGOGASTRODUODENOSCOPY (EGD) WITH PROPOFOL N/A 02/07/2016   Procedure: ESOPHAGOGASTRODUODENOSCOPY (EGD) WITH PROPOFOL;  Surgeon: JJerene Bears MD;  Location: [facility];  Service: Gastroenterology;  Laterality: N/A;  . HARDWARE REMOVAL N/A 07/02/2012   Procedure: HARDWARE REMOVAL;  Surgeon: GElaina Hoops MD;  [facility] [facility];  Service: Neurosurgery;  Laterality: N/A;  . HIATAL HERNIA REPAIR     wrap  . INGUINAL HERNIA REPAIR Bilateral   . JOINT REPLACEMENT Left 1999  . KNEE ARTHROPLASTY  07/22/2011   Procedure: COMPUTER ASSISTED TOTAL KNEE ARTHROPLASTY;  Surgeon: GMeredith Pel MD;  [place] [facility]  Service: Orthopedics;   Laterality: Right;  Right total knee arthroplasty  . MANDIBLE SURGERY Bilateral    tmj  . REPLACEMENT TOTAL KNEE Bilateral   . ring around testicle hernia reapir  184 and 1986   x 2  . ROTATOR CUFF REPAIR Right   . SHOULDER SURGERY Left   . SPINE SURGERY  2010   "rod and screws", "failed", lopwer spine,   . stent to heart x 1  2010  . TONSILLECTOMY    . UMBILICAL HERNIA REPAIR     x 1  . varicose vein     stripping  . VIDEO BRONCHOSCOPY Bilateral 10/16/2015   Procedure: VIDEO BRONCHOSCOPY WITHOUT FLUORO;  Surgeon: MBrand Males MD;  Location: [facility];  Service: Cardiopulmonary;  Laterality: Bilateral;    Social History:  reports that he has never smoked. He has never used smokeless tobacco. He reports that he drinks alcohol. He reports that he does not use drugs.  Allergies:  Allergies  Allergen Reactions  . Morphine Other (See Comments)    REACTION: severe headache  . Other Anaphylaxis and Hives    Pecan  . Peanut-Containing Drug Products Anaphylaxis and Hives    Swelling of throat  . Oxycodone-Acetaminophen Other (See Comments)    REACTION: headache  . Penicillins Rash    Has patient had a PCN reaction causing immediate rash, facial/tongue/throat swelling, SOB or lightheadedness with hypotension: Yes Has patient had a PCN reaction causing severe rash involving mucus membranes or skin necrosis: No Has patient had a PCN reaction that required hospitalization No Has patient had a PCN reaction occurring within the last 10 years: No If all of the above answers are "NO", then may proceed with Cephalosporin use.   REACTION: red, flushed  . Promethazine Hcl Other (See Comments)    REACTION: makes him feel drunk    Medications Prior to Admission  Medication Sig Dispense Refill  . aspirin EC 81 MG tablet Take 81 mg by mouth daily.    [name] atorvastatin (LIPITOR) 10 MG tablet TAKE 1 TABLET (10 MG TOTAL) BY MOUTH AT BEDTIME. 90 tablet 2  . Certolizumab Pegol (CIMZIA [place])  Inject into the skin. Pt unsure of dose. Every four weeks.    . ciclopirox (LOPROX) 0.77 % SUSP Apply 1 Act topically 2 (two) times daily. 60 mL 5  . escitalopram (LEXAPRO) 10 MG tablet Take 1 tablet (10 mg total) by mouth daily. 90 tablet 3  . fluticasone (FLONASE) 50 MCG/ACT nasal spray Place 2 sprays into both nostrils daily. 15 g 2  . furosemide (LASIX) 20 MG tablet TAKE 1 TABLET (20 MG TOTAL) BY MOUTH 2 (TWO) TIMES DAILY. 60 tablet 5  . GENVOYA 150-150-200-10 MG TABS tablet TAKE 1 TABLET BY MOUTH DAILY WITH BREAKFAST. 30 tablet 5  . loratadine (CLARITIN) 10 MG tablet TAKE 1 TABLET (10 MG TOTAL) BY MOUTH DAILY. 90 tablet 3  . metoprolol succinate (TOPROL-XL) 25 MG 24 hr tablet Take 1 tablet (25 mg total) by mouth daily. Please schedule appointment for refills. 30 tablet 6  . pantoprazole (PROTONIX) 40 MG tablet Take 1 tablet (40 mg total) by mouth 2 (two) times daily before a meal. (Patient taking differently: Take 40 mg by mouth every evening. ) 60 tablet 3  . predniSONE (DELTASONE) 5 MG tablet Take 5 mg by mouth daily with breakfast.     . pregabalin (LYRICA) 200 MG capsule Take 1 capsule (200 mg total) by mouth 2 (two) times daily. 60 capsule 1  . PREZISTA 800 MG tablet TAKE 1 TABLET (800 MG TOTAL) BY MOUTH DAILY. 30 tablet 5  . saccharomyces boulardii (FLORASTOR) 250 MG capsule Take 1 capsule (250 mg total) by mouth 2 (two) times daily. 60 capsule 1  . SAVAYSA 30 MG TABS tablet TAKE 1 TABLET (30 MG TOTAL) BY MOUTH DAILY. 30 tablet 5  . sulfamethoxazole-trimethoprim (BACTRIM DS,SEPTRA DS) 800-160 MG tablet TAKE 1 TABLET BY MOUTH DAILY. 30 tablet 3  . valACYclovir (VALTREX) 500 MG tablet TAKE 1 TABLET BY MOUTH EVERY DAY 90 tablet 3  . zolpidem (AMBIEN) 5 MG tablet TAKE 1 TABLET BY MOUTH AT BEDTIME AS NEEDED FOR SLEEP 30 tablet 2    Blood pressure 121/63, pulse 78, temperature 98.3 F (36.8 C), temperature source Oral, resp. rate 16, height '5\' 5"'$  (1.651 m), weight 201 lb 4.5 oz (91.3 kg),  SpO2 99 %.  Physical Exam: General: pleasant, WD/WN white male who is laying in bed in NAD Head is  normocephalic, atraumatic.   Eyes: Sclera are noninjected, no scleral icterus, lids normal, EOM grossly intact.  Mouth: Oral mucosa is pink and moist, lips without edema. Heart: regular, rate, and rhythm. No gallops, or rubs noted.  2+ radial pulses bilaterally Lungs: Respiratory effort nonlabored,. Rate normal Abd: soft, ND, +BS, no masses, hernias, or organomegaly appreciated, TTP to suprapubic region and LLQ GU: no rashes noted, circumcised penis without discharge or erythema, right testicle is edematous with mild erythema, non TTP. Left testicle is without edema or erythema, with mild TTP.  Skin: warm and dry, he is not diaphoretic Psych: A&Ox3 with an appropriate affect, behavior is normal, mood is normal. Neuro: CM grossly 2-12 intact, normal speech  Results for orders placed or performed during the hospital encounter of 05/13/16 (from the past 48 hour(s))  Troponin I (q 6hr x 3)     Status: None   Collection Time: 05/14/16  9:01 PM  Result Value Ref Range   Troponin I <0.03 <0.03 ng/mL  Troponin I (q 6hr x 3)     Status: None   Collection Time: 05/15/16  2:48 AM  Result Value Ref Range   Troponin I <0.03 <0.03 ng/mL  Glucose, capillary     Status: None   Collection Time: 05/15/16  6:26 AM  Result Value Ref Range   Glucose-Capillary 87 65 - 99 mg/dL  Troponin I (q 6hr x 3)     Status: None   Collection Time: 05/15/16  8:31 AM  Result Value Ref Range   Troponin I <0.03 <0.03 ng/mL  CBC     Status: Abnormal   Collection Time: 05/15/16  8:31 AM  Result Value Ref Range   WBC 9.1 4.0 - 10.5 K/uL   RBC 3.96 (L) 4.22 - 5.81 MIL/uL   Hemoglobin 11.1 (L) 13.0 - 17.0 g/dL   HCT 33.4 (L) 39.0 - 52.0 %   MCV 84.3 78.0 - 100.0 fL   MCH 28.0 26.0 - 34.0 pg   MCHC 33.2 30.0 - 36.0 g/dL   RDW 15.5 11.5 - 15.5 %   Platelets 114 (L) 150 - 400 K/uL    Comment: CONSISTENT WITH PREVIOUS  RESULT  Basic metabolic panel     Status: Abnormal   Collection Time: 05/15/16  8:31 AM  Result Value Ref Range   Sodium 129 (L) 135 - 145 mmol/L   Potassium 3.7 3.5 - 5.1 mmol/L   Chloride 96 (L) 101 - 111 mmol/L   CO2 27 22 - 32 mmol/L   Glucose, Bld 100 (H) 65 - 99 mg/dL   BUN 11 6 - 20 mg/dL   Creatinine, Ser 0.96 0.61 - 1.24 mg/dL   Calcium 7.9 (L) 8.9 - 10.3 mg/dL   GFR calc non Af Amer >60 >60 mL/min   GFR calc Af Amer >60 >60 mL/min    Comment: (NOTE) The eGFR has been calculated using the CKD EPI equation. This calculation has not been validated in all clinical situations. eGFR's persistently <60 mL/min signify possible Chronic Kidney Disease.    Anion gap 6 5 - 15  Glucose, capillary     Status: Abnormal   Collection Time: 05/16/16  6:33 AM  Result Value Ref Range   Glucose-Capillary 101 (H) 65 - 99 mg/dL  CBC     Status: Abnormal   Collection Time: 05/16/16  8:13 AM  Result Value Ref Range   WBC 7.6 4.0 - 10.5 K/uL   RBC 4.10 (L) 4.22 - 5.81 MIL/uL  Hemoglobin 11.4 (L) 13.0 - 17.0 g/dL   HCT 34.9 (L) 39.0 - 52.0 %   MCV 85.1 78.0 - 100.0 fL   MCH 27.8 26.0 - 34.0 pg   MCHC 32.7 30.0 - 36.0 g/dL   RDW 15.8 (H) 11.5 - 15.5 %   Platelets 118 (L) 150 - 400 K/uL    Comment: CONSISTENT WITH PREVIOUS RESULT  Basic metabolic panel     Status: Abnormal   Collection Time: 05/16/16  8:13 AM  Result Value Ref Range   Sodium 134 (L) 135 - 145 mmol/L   Potassium 3.4 (L) 3.5 - 5.1 mmol/L   Chloride 100 (L) 101 - 111 mmol/L   CO2 26 22 - 32 mmol/L   Glucose, Bld 129 (H) 65 - 99 mg/dL   BUN 7 6 - 20 mg/dL   Creatinine, Ser 0.82 0.61 - 1.24 mg/dL   Calcium 8.1 (L) 8.9 - 10.3 mg/dL   GFR calc non Af Amer >60 >60 mL/min   GFR calc Af Amer >60 >60 mL/min    Comment: (NOTE) The eGFR has been calculated using the CKD EPI equation. This calculation has not been validated in all clinical situations. eGFR's persistently <60 mL/min signify possible Chronic Kidney Disease.     Anion gap 8 5 - 15   Dg Chest Port 1 View  Result Date: 05/14/2016 CLINICAL DATA:  Chest pain and shortness of breath EXAM: PORTABLE CHEST 1 VIEW COMPARISON:  Chest radiograph November 07, 2015 and chest CT January 24, 2016 FINDINGS: There is mild patchy infiltrate in the left base. Lungs elsewhere clear. Heart size and pulmonary vascularity are normal. No adenopathy. Patient is status post internal mammary bypass grafting. No adenopathy. No bone lesions. IMPRESSION: Smaller of infiltrate left base, most likely pneumonia. Lungs elsewhere clear. Followup PA and lateral chest radiographs recommended in 3-4 weeks following trial of antibiotic therapy to ensure resolution and exclude underlying malignancy. Electronically Signed   By: Lowella Grip III M.D.   On: 05/14/2016 21:05   Dg Abd 2 Views  Result Date: 05/15/2016 CLINICAL DATA:  Constipation. EXAM: ABDOMEN - 2 VIEW COMPARISON:  CT 05/14/2015. FINDINGS: Limited exam due to positioning. Soft tissue structures are unremarkable. Several loops of slightly dilated air-filled small bowel noted. Adynamic ileus and/or partial small bowel obstruction cannot be completely excluded. Follow-up abdominal series suggested . Colonic gas pattern is normal. Mild amount stool noted throughout colon . Surgical clips right upper quadrant. Surgical clips noted over the mid abdomen. Aortoiliac atherosclerotic vascular calcification. Prior lumbar spine fusion. IMPRESSION: 1. Several air-filled loops of slightly dilated small bowel noted. Adynamic ileus or partial small bowel obstruction cannot be completely excluded. Colonic gas pattern is normal with mild amount of stool noted throughout colon. No free air. Follow-up abdominal series suggested for further evaluation. 2. Aortoiliac atherosclerotic vascular disease. Electronically Signed   By: Marcello Moores  Register   On: 05/15/2016 10:56      Assessment/Plan  Acute on chronic low-back pain, intractable  Cellulitis, LLE -  clindamycin, no DVT seen on [place]  HIV  - CD4 was 1,140 and VL 31 in October 2017, Continue Prezista and [place]  Hx of recurrent DVT - Continue Savaysa  CLL  - Pt followed by oncology, not currently on any treatments - CT abd/pelvis demonstrates a mild increase in retroperitoneal and pelvic adenopathy CAD - ASA, Lipitor, Toprol  Hypertension, essential  Rheumatoid arthritis  - prednisone 5 mg daily  LLL PNA - Zithromax added 1/3,   Hyponatremia  Right testicular swelling - testicular US ordered, primary will follow up on results  Thrombocytopenia - chronic  Hypokalemia   Abd distension - ileus vs pSBO - Ab xray yesterday showed several air-filled loops of slightly dilated small bowel noted. Adynamic ileus or partial small bowel obstruction cannot be completely excluded. Colonic gas pattern is normal with mild amount of stool noted throughout colon. No free air.  - pt has BS and having flatus, no nausea or vomiting, no abdominal distention, LLQ and suprapubic TTP - primary placed on bowel regimen and added suppository this am and if not working, may need enema  - switch to clear liquids   Plan: Pt has an extensive history of laparoscopic abdominal surgeries to include hiatal hernia repair, cholecystectomy, bilateral inguinal hernia repair, umbilical hernia repair. Abdominal films showed several air-filled loops of slightly dilated small bowel. Pt is still passing gas, abdominal distention has resolved, no nausea or vomiting. The clinical picture looks more like ileus as the pt has also not been walking 2/2 his back pain. I suggest clear liquids and repeat abdominal xray tomorrow AM. No NGT at this time since the pt is not having N&V. Pain could be related to pelvic lymphadenopathy and mild constipation.   We will continue to follow the pt. Thank you for the consult.   Kalman Drape, [facility] Surgery 05/16/2016, 11:43 AM Pager: [phone] Consults: [phone] Mon-Fri  7:00 am-4:30 pm Sat-Sun 7:00 am-11:30 am

## 2016-05-17 ENCOUNTER — Inpatient Hospital Stay (HOSPITAL_COMMUNITY): Payer: Medicare Other

## 2016-05-17 LAB — BASIC METABOLIC PANEL
Anion gap: 8 (ref 5–15)
BUN: 7 mg/dL (ref 6–20)
CO2: 25 mmol/L (ref 22–32)
Calcium: 8.2 mg/dL — ABNORMAL LOW (ref 8.9–10.3)
Chloride: 98 mmol/L — ABNORMAL LOW (ref 101–111)
Creatinine, Ser: 0.7 mg/dL (ref 0.61–1.24)
GFR calc Af Amer: >60 mL/min (ref >60)
GFR calc non Af Amer: >60 mL/min (ref >60)
Glucose, Bld: 89 mg/dL (ref 65–99)
Potassium: 3.9 mmol/L (ref 3.5–5.1)
Sodium: 131 mmol/L — ABNORMAL LOW (ref 135–145)

## 2016-05-17 LAB — RESPIRATORY PANEL BY PCR

## 2016-05-17 LAB — CBC
HCT: 36.3 % — ABNORMAL LOW (ref 39.0–52.0)
Hemoglobin: 11.9 g/dL — ABNORMAL LOW (ref 13.0–17.0)
MCH: 27.7 pg (ref 26.0–34.0)
MCHC: 32.8 g/dL (ref 30.0–36.0)
MCV: 84.6 fL (ref 78.0–100.0)
Platelets: 144 10*3/uL — ABNORMAL LOW (ref 150–400)
RBC: 4.29 MIL/uL (ref 4.22–5.81)
RDW: 15.8 % — ABNORMAL HIGH (ref 11.5–15.5)
WBC: 10.3 10*3/uL (ref 4.0–10.5)

## 2016-05-17 NOTE — Progress Notes (Signed)
Patient ID: [name], male   DOB: 1950-07-22, 66 y.o.   MRN: [id number]    PROGRESS NOTE    [name]  [id number] DOB: 1951/04/22 DOA: 05/13/2016  PCP: [name], MD   Brief Narrative:   66 y.o. male with medical history significant for HIV, CLL, CAD status post CABG, rheumatoid arthritis, and degenerative disc disease with chronic low back pain status post posterior fusion, presenting to the emergency department with acute worsening of his chronic low back pain. Patient reports that he was in his usual state of health until 05/09/2016 when he experienced insidious worsening in his chronic low back pain. There was no precipitating event that he could recall, no fall or trauma, and no heavy lifting or increased exertion.  Assessment & Plan:   1. Acute on chronic low-back pain, intractable  - Pt with chronic LBP s/p fusion  - There was no precipitating event identified; no fevers or wt-loss reported; no incontinence or saddle paraesthesia  - CT L-spine with stable chronic changes; no mass, infection, or significant canal stenosis noted  - MRI L-spine with no specific acute finding to explain the cause  - d/w neurosurgery, no indication for an intervention  - pt still in significant pain in both hips area  2. Cellulitis, LLE  - Mild non-purulent cellulitis with penicillin allergy, started on empiric clindamycin and will continue same  - Underlying DVT excluded with venous dopplers, notable for left inguinal adenopathy   3. HIV  - CD4 was 1,140 and VL 31 in October 2017  - Continue current management with Prezista and Genvoya    4. Hx of recurrent DVT  - Continue Savaysa  - There is LLE edema, possibly related to post-thrombotic syndrome or pelvic and retroperitoneal adenopathy  - US performed and negative for DVT    5. CLL  - Pt continues to follow with oncology, not currently on any treatments  - CT abd/pelvis demonstrates a mild increase in retroperitoneal and  pelvic adenopathy  - outpatient follow up  6. CAD - No anginal complaints  - Continue ASA, Lipitor, Toprol    7. Hypertension, essential  - stable this AM 129/76 mmHg  8. Rheumatoid arthritis  - Continue prednisone 5 mg daily   9. Incidental lung nodule  - 5 mm LLL nodule noted on CT   10. LLL PNA - Zithromax added 1/3, continue same regimen   11. Abd distension - ileus vs pSBO - placed on bowel regimen, had one BM yesterday, added enema today  - keep on clear liquid diet   12. Hyponatremia - appears to be pre renal  - improving - BMP In AM  13. Right testicular swelling - testicular US ordered, will follow up on results   14. Thrombocytopenia - chronic  - overall stable with no signs of active bleeding   15. Obesity  - Body mass index is 33.49 kg/m  16. Hypokalemia - mild, supplemented and WNL this AM    DVT prophylaxis: Edoxaban Code Status: Full  Family Communication: Patient at bedside  Disposition Plan: Home once medically stable   Consultants:   Neurosurgery over the phone  Surgery   Procedures:   None  Antimicrobials:   Clindamycin 1/2 -->  Zithromax 1/3 -->  Subjective: Persistent back pain and abd distension.   Objective: Vitals:   05/16/16 1500 05/16/16 1954 05/17/16 0500 05/17/16 1530  BP: 112/63 120/73 124/69 106/72  Pulse: 83 82 78 84  Resp: 16 17 17  17  Temp: 98.1 F (36.7 C) 99.2 F (37.3 C) 98.1 F (36.7 C) 98.5 F (36.9 C)  TempSrc: Oral Oral Oral Oral  SpO2: 98% 97% 98% 92%  Weight:      Height:        Intake/Output Summary (Last 24 hours) at 05/17/16 1653 Last data filed at 05/17/16 1300  Gross per 24 hour  Intake              870 ml  Output             1200 ml  Net             -330 ml   Filed Weights   05/13/16 2300  Weight: 91.3 kg (201 lb 4.5 oz)    Examination:  General exam: Appears calm and comfortable  Respiratory system: Clear to auscultation. Respiratory effort normal. Mild rhonchi at  bases. Cardiovascular system: S1 & S2 heard, RRR. No JVD, murmurs, rubs, gallops or clicks. No pedal edema. Gastrointestinal system: Abdomen is diistended, soft and nontender. No organomegaly or masses felt.  Central nervous system: Alert and oriented.   Data Reviewed: I have personally reviewed following labs and imaging studies  CBC:  Recent Labs Lab 05/13/16 1405 05/15/16 0831 05/16/16 0813 05/17/16 0701  WBC 10.3 9.1 7.6 10.3  NEUTROABS 3.6  --   --   --   HGB 12.2* 11.1* 11.4* 11.9*  HCT 37.4* 33.4* 34.9* 36.3*  MCV 86.2 84.3 85.1 84.6  PLT 117* 114* 118* [id number]*   Basic Metabolic Panel:  Recent Labs Lab 05/13/16 1405 05/14/16 0647 05/15/16 0831 05/16/16 0813 05/17/16 0701  NA 135 136 129* 134* 131*  K 3.3* 3.5 3.7 3.4* 3.9  CL 101 99* 96* 100* 98*  CO2 27 25 27 26 25   GLUCOSE 86 74 100* 129* 89  BUN 15 12 11 7 7   CREATININE 0.94 0.93 0.96 0.82 0.70  CALCIUM 8.4* 8.3* 7.9* 8.1* 8.2*   Liver Function Tests:  Recent Labs Lab 05/13/16 1405  AST 36  ALT 29  ALKPHOS 51  BILITOT 0.9  PROT 5.3*  ALBUMIN 3.1*    Recent Labs Lab 05/13/16 1405  LIPASE 12   CBG:  Recent Labs Lab 05/14/16 0653 05/14/16 0710 05/15/16 0626 05/16/16 0633  GLUCAP 64* 76 87 101*   Urine analysis:    Component Value Date/Time   COLORURINE AMBER (A) 05/13/2016 1754   APPEARANCEUR CLEAR 05/13/2016 1754   LABSPEC >1.046 (H) 05/13/2016 1754   PHURINE 5.0 05/13/2016 1754   GLUCOSEU NEGATIVE 05/13/2016 1754   HGBUR NEGATIVE 05/13/2016 1754   BILIRUBINUR NEGATIVE 05/13/2016 1754   KETONESUR 20 (A) 05/13/2016 1754   PROTEINUR 30 (A) 05/13/2016 1754   UROBILINOGEN 1.0 01/17/2015 1218   NITRITE NEGATIVE 05/13/2016 1754   LEUKOCYTESUR NEGATIVE 05/13/2016 1754   Radiology Studies: US Scrotum  Result Date: 05/16/2016 CLINICAL DATA:  Right scrotal pain and swelling EXAM: SCROTAL ULTRASOUND DOPPLER ULTRASOUND OF THE TESTICLES TECHNIQUE: Complete ultrasound examination of the  testicles, epididymis, and other scrotal structures was performed. Color and spectral Doppler ultrasound were also utilized to evaluate blood flow to the testicles. COMPARISON:  August 17, 2014 FINDINGS: Right testicle Measurements: 4.4 x 2.9 x 3.3 cm. No mass or microlithiasis visualized. Left testicle Measurements: 3.7 x 2.1 x 3.6 cm. No mass or microlithiasis visualized. Right epididymis: The right epididymis appears somewhat edematous and hyperemic. There are cysts in the head of the epididymis, largest measuring 9 x 7 mm. Left epididymis:  Normal  in size and appearance. Hydrocele: There is a fairly large right hydrocele containing mild debris. There is a much smaller hydrocele on the left. Varicocele:  None visualized. Pulsed Doppler interrogation of both testes demonstrates normal low resistance arterial and venous waveforms bilaterally. There is calcification along the wall of the left scrotal sac without wall thickening. There is no scrotal abscess on either side. IMPRESSION: Evidence of a degree of epididymitis on the right. Several small cysts noted in the right epididymis. No intratesticular mass or inflammatory focus. No testicular torsion. Evidence of prior inflammation in the left scrotal sac with calcification along the left scrotal sac. There is again noted a fairly large hydrocele on the right. Mild debris in hydrocele on the right is probably due to the epididymitis. There is a much smaller left hydrocele. Electronically Signed   By: Lowella Grip III M.D.   On: 05/16/2016 21:30   [place] Art/ven Flow Abd Pelv Doppler  Result Date: 05/16/2016 CLINICAL DATA:  Right scrotal pain and swelling EXAM: SCROTAL ULTRASOUND DOPPLER ULTRASOUND OF THE TESTICLES TECHNIQUE: Complete ultrasound examination of the testicles, epididymis, and other scrotal structures was performed. Color and spectral Doppler ultrasound were also utilized to evaluate blood flow to the testicles. COMPARISON:  August 17, 2014 FINDINGS:  Right testicle Measurements: 4.4 x 2.9 x 3.3 cm. No mass or microlithiasis visualized. Left testicle Measurements: 3.7 x 2.1 x 3.6 cm. No mass or microlithiasis visualized. Right epididymis: The right epididymis appears somewhat edematous and hyperemic. There are cysts in the head of the epididymis, largest measuring 9 x 7 mm. Left epididymis:  Normal in size and appearance. Hydrocele: There is a fairly large right hydrocele containing mild debris. There is a much smaller hydrocele on the left. Varicocele:  None visualized. Pulsed Doppler interrogation of both testes demonstrates normal low resistance arterial and venous waveforms bilaterally. There is calcification along the wall of the left scrotal sac without wall thickening. There is no scrotal abscess on either side. IMPRESSION: Evidence of a degree of epididymitis on the right. Several small cysts noted in the right epididymis. No intratesticular mass or inflammatory focus. No testicular torsion. Evidence of prior inflammation in the left scrotal sac with calcification along the left scrotal sac. There is again noted a fairly large hydrocele on the right. Mild debris in hydrocele on the right is probably due to the epididymitis. There is a much smaller left hydrocele. Electronically Signed   By: Lowella Grip III M.D.   On: 05/16/2016 21:30   Dg Abd Portable 2v  Result Date: 05/17/2016 CLINICAL DATA:  67 year old male with abdominal and vomiting. Chronic severe abdominal pain. Patient uses narcotics daily. EXAM: PORTABLE ABDOMEN - 2 VIEW COMPARISON:  Prior abdominal radiograph 05/15/2016 FINDINGS: Incompletely imaged cardiomegaly. Patient is status post median sternotomy with evidence of prior multivessel CABG. Low inspiratory volumes with nonspecific patchy right lower lobe airspace opacity which could reflect atelectasis or infiltrate. No evidence of free air on the up right images. Unchanged bowel gas pattern. Persisting gas within a minimally  dilated loops small bowel in the right abdomen. Advanced multilevel degenerative disc disease with levoconvex scoliosis and surgical changes of prior posterior lumbar interbody fusion. IMPRESSION: 1. Unchanged bowel gas pattern with gas in multiple loops of minimally dilated small bowel in the right mid abdomen. Ileus versus partial small bowel obstruction. 2. Low inspiratory volumes with nonspecific right basilar patchy airspace opacity which may reflect atelectasis or infiltrate. Electronically Signed   By: Dellis Filbert.D.  On: 05/17/2016 11:03    Scheduled Meds: . aspirin EC  81 mg Oral Daily  . atorvastatin  10 mg Oral QHS  . azithromycin  500 mg Oral Q24H  . clindamycin  300 mg Oral Q6H  . Darunavir Ethanolate  800 mg Oral Q breakfast  . edoxaban  30 mg Oral Daily  . elvitegravir-cobicistat-emtricitabine-tenofovir  1 tablet Oral Q breakfast  . erythromycin   Both Eyes Q6H  . escitalopram  10 mg Oral Daily  . guaiFENesin  600 mg Oral BID  . ketorolac  15 mg Intravenous Q6H  . loratadine  10 mg Oral Daily  . metoprolol succinate  25 mg Oral Daily  . pantoprazole  40 mg Oral BID AC  . predniSONE  5 mg Oral Q breakfast  . pregabalin  200 mg Oral BID  . saccharomyces boulardii  250 mg Oral BID  . senna-docusate  1 tablet Oral BID  . sodium chloride flush  3 mL Intravenous Q12H  . sulfamethoxazole-trimethoprim  1 tablet Oral Daily  . valACYclovir  500 mg Oral Daily   Continuous Infusions:   LOS: 3 days   Time spent: 20 minutes   Faye Ramsay, MD [facility] Pager [phone]  If 7PM-7AM, please contact night-coverage [email or web address] Password [email or web address] 05/17/2016, 4:53 PM

## 2016-05-17 NOTE — Progress Notes (Signed)
Subjective: Says he had a small stool yesterday.  After suppository No stool or flatus since Vomited this morning... Not recorded Enema has not been given Abdominal x-rays have not been done  Hemoglobin 11.9, stable.  WBC 10,300.  Potassium 3.9.  Creatinine 0.7.      Objective: Vital signs in last 24 hours: Temp:  [98.1 F (36.7 C)-99.2 F (37.3 C)] 98.1 F (36.7 C) (01/06 0500) Pulse Rate:  [78-83] 78 (01/06 0500) Resp:  [16-17] 17 (01/06 0500) BP: (112-124)/(63-73) 124/69 (01/06 0500) SpO2:  [97 %-98 %] 98 % (01/06 0500) Last BM Date: 05/16/16  Intake/Output from previous day: 01/05 0701 - 01/06 0700 In: 870 [P.O.:870] Out: 600 [Urine:600] Intake/Output this shift: Total I/O In: 240 [P.O.:240] Out: -   General appearance: Alert.  Mild distress.  Does not look ill or toxic, just anxious Resp: clear to auscultation bilaterally GI: Abdomen soft.  No guarding or rebound.  No hernia or mass.  Complains of pain with deep palpation.  No inguinal or femoral hernia noted.  Active bowel sounds.  Nothing high-pitched.  Does not sound obstructive.  Lab Results:   Recent Labs  05/16/16 0813 05/17/16 0701  WBC 7.6 10.3  HGB 11.4* 11.9*  HCT 34.9* 36.3*  PLT 118* 144*   BMET  Recent Labs  05/16/16 0813 05/17/16 0701  NA 134* 131*  K 3.4* 3.9  CL 100* 98*  CO2 26 25  GLUCOSE 129* 89  BUN 7 7  CREATININE 0.82 0.70  CALCIUM 8.1* 8.2*   PT/INR No results for input(s): LABPROT, INR in the last 72 hours. ABG No results for input(s): PHART, HCO3 in the last 72 hours.  Invalid input(s): PCO2, PO2  Studies/Results: US Scrotum  Result Date: 05/16/2016 CLINICAL DATA:  Right scrotal pain and swelling EXAM: SCROTAL ULTRASOUND DOPPLER ULTRASOUND OF THE TESTICLES TECHNIQUE: Complete ultrasound examination of the testicles, epididymis, and other scrotal structures was performed. Color and spectral Doppler ultrasound were also utilized to evaluate blood flow to the  testicles. COMPARISON:  August 17, 2014 FINDINGS: Right testicle Measurements: 4.4 x 2.9 x 3.3 cm. No mass or microlithiasis visualized. Left testicle Measurements: 3.7 x 2.1 x 3.6 cm. No mass or microlithiasis visualized. Right epididymis: The right epididymis appears somewhat edematous and hyperemic. There are cysts in the head of the epididymis, largest measuring 9 x 7 mm. Left epididymis:  Normal in size and appearance. Hydrocele: There is a fairly large right hydrocele containing mild debris. There is a much smaller hydrocele on the left. Varicocele:  None visualized. Pulsed Doppler interrogation of both testes demonstrates normal low resistance arterial and venous waveforms bilaterally. There is calcification along the wall of the left scrotal sac without wall thickening. There is no scrotal abscess on either side. IMPRESSION: Evidence of a degree of epididymitis on the right. Several small cysts noted in the right epididymis. No intratesticular mass or inflammatory focus. No testicular torsion. Evidence of prior inflammation in the left scrotal sac with calcification along the left scrotal sac. There is again noted a fairly large hydrocele on the right. Mild debris in hydrocele on the right is probably due to the epididymitis. There is a much smaller left hydrocele. Electronically Signed   By: Lowella Grip III M.D.   On: 05/16/2016 21:30   [place] Art/ven Flow Abd Pelv Doppler  Result Date: 05/16/2016 CLINICAL DATA:  Right scrotal pain and swelling EXAM: SCROTAL ULTRASOUND DOPPLER ULTRASOUND OF THE TESTICLES TECHNIQUE: Complete ultrasound examination of the testicles, epididymis, and  other scrotal structures was performed. Color and spectral Doppler ultrasound were also utilized to evaluate blood flow to the testicles. COMPARISON:  August 17, 2014 FINDINGS: Right testicle Measurements: 4.4 x 2.9 x 3.3 cm. No mass or microlithiasis visualized. Left testicle Measurements: 3.7 x 2.1 x 3.6 cm. No mass or  microlithiasis visualized. Right epididymis: The right epididymis appears somewhat edematous and hyperemic. There are cysts in the head of the epididymis, largest measuring 9 x 7 mm. Left epididymis:  Normal in size and appearance. Hydrocele: There is a fairly large right hydrocele containing mild debris. There is a much smaller hydrocele on the left. Varicocele:  None visualized. Pulsed Doppler interrogation of both testes demonstrates normal low resistance arterial and venous waveforms bilaterally. There is calcification along the wall of the left scrotal sac without wall thickening. There is no scrotal abscess on either side. IMPRESSION: Evidence of a degree of epididymitis on the right. Several small cysts noted in the right epididymis. No intratesticular mass or inflammatory focus. No testicular torsion. Evidence of prior inflammation in the left scrotal sac with calcification along the left scrotal sac. There is again noted a fairly large hydrocele on the right. Mild debris in hydrocele on the right is probably due to the epididymitis. There is a much smaller left hydrocele. Electronically Signed   By: Lowella Grip III M.D.   On: 05/16/2016 21:30   Dg Abd 2 Views  Result Date: 05/15/2016 CLINICAL DATA:  Constipation. EXAM: ABDOMEN - 2 VIEW COMPARISON:  CT 05/14/2015. FINDINGS: Limited exam due to positioning. Soft tissue structures are unremarkable. Several loops of slightly dilated air-filled small bowel noted. Adynamic ileus and/or partial small bowel obstruction cannot be completely excluded. Follow-up abdominal series suggested . Colonic gas pattern is normal. Mild amount stool noted throughout colon . Surgical clips right upper quadrant. Surgical clips noted over the mid abdomen. Aortoiliac atherosclerotic vascular calcification. Prior lumbar spine fusion. IMPRESSION: 1. Several air-filled loops of slightly dilated small bowel noted. Adynamic ileus or partial small bowel obstruction cannot be  completely excluded. Colonic gas pattern is normal with mild amount of stool noted throughout colon. No free air. Follow-up abdominal series suggested for further evaluation. 2. Aortoiliac atherosclerotic vascular disease. Electronically Signed   By: Marcello Moores  Register   On: 05/15/2016 10:56    Anti-infectives: Anti-infectives    Start     Dose/Rate Route Frequency Ordered Stop   05/16/16 2130  azithromycin (ZITHROMAX) tablet 500 mg     500 mg Oral Every 24 hours 05/16/16 1315     05/14/16 2200  azithromycin (ZITHROMAX) 500 mg in dextrose 5 % 250 mL IVPB  Status:  Discontinued     500 mg 250 mL/hr over 60 Minutes Intravenous Every 24 hours 05/14/16 2152 05/16/16 1315   05/14/16 1000  valACYclovir (VALTREX) tablet 500 mg     500 mg Oral Daily 05/13/16 2321     05/14/16 1000  sulfamethoxazole-trimethoprim (BACTRIM DS,SEPTRA DS) 800-160 MG per tablet 1 tablet     1 tablet Oral Daily 05/13/16 2321     05/14/16 0800  elvitegravir-cobicistat-emtricitabine-tenofovir (GENVOYA) 150-150-200-10 MG tablet 1 tablet     1 tablet Oral Daily with breakfast 05/13/16 2321     05/14/16 0800  Darunavir Ethanolate (PREZISTA) tablet 800 mg     800 mg Oral Daily with breakfast 05/13/16 2321     05/14/16 0000  clindamycin (CLEOCIN) capsule 300 mg     300 mg Oral Every 6 hours 05/13/16 2322 05/18/16 2359  Assessment/Plan:  Abdominal pain and vomiting.  Ileus secondary to inactivity and narcotic seem more likely than SBO Allow clear liquids Spoke with RN.  To give enema now Spoke with RN.  To send downstairs for to the abdominal x-ray now  HIV-positive CLL CAD status post CABG Rheumatoid arthritis-on low-dose prednisone. Severe degenerative disc disease with low back pain status post fusion.  Neurosurgery states no indication for intervention.   LOS: 3 days    Jaycelynn Knickerbocker M. Dalbert Batman, M.D., [facility] General and Minimally invasive Surgery Breast and Colorectal Surgery Office:    [phone]

## 2016-05-17 NOTE — Progress Notes (Signed)
Pt had a large bowel movement and refused enema.

## 2016-05-18 ENCOUNTER — Encounter (HOSPITAL_COMMUNITY): Payer: Self-pay

## 2016-05-18 LAB — BASIC METABOLIC PANEL
Anion gap: 7 (ref 5–15)
BUN: 9 mg/dL (ref 6–20)
CO2: 28 mmol/L (ref 22–32)
Calcium: 8 mg/dL — ABNORMAL LOW (ref 8.9–10.3)
Chloride: 96 mmol/L — ABNORMAL LOW (ref 101–111)
Creatinine, Ser: 0.81 mg/dL (ref 0.61–1.24)
GFR calc Af Amer: >60 mL/min (ref >60)
GFR calc non Af Amer: >60 mL/min (ref >60)
Glucose, Bld: 96 mg/dL (ref 65–99)
Potassium: 3.8 mmol/L (ref 3.5–5.1)
Sodium: 131 mmol/L — ABNORMAL LOW (ref 135–145)

## 2016-05-18 LAB — CBC
HCT: 33 % — ABNORMAL LOW (ref 39.0–52.0)
Hemoglobin: 11 g/dL — ABNORMAL LOW (ref 13.0–17.0)
MCH: 28.2 pg (ref 26.0–34.0)
MCHC: 33.3 g/dL (ref 30.0–36.0)
MCV: 84.6 fL (ref 78.0–100.0)
Platelets: 150 10*3/uL (ref 150–400)
RBC: 3.9 MIL/uL — ABNORMAL LOW (ref 4.22–5.81)
RDW: 15.8 % — ABNORMAL HIGH (ref 11.5–15.5)
WBC: 10.4 10*3/uL (ref 4.0–10.5)

## 2016-05-18 MED ORDER — POLYETHYLENE GLYCOL 3350 17 G PO PACK: 17.0000 g | PACK | Freq: Every day | ORAL | 0 refills | Status: DC | PRN

## 2016-05-18 MED ORDER — METHOCARBAMOL 500 MG PO TABS
500.0000 mg | ORAL_TABLET | Freq: Three times a day (TID) | ORAL | 0 refills | Status: DC | PRN
Start: 2016-05-18 — End: 2016-05-27

## 2016-05-18 MED ORDER — LEVOFLOXACIN 500 MG PO TABS: 500.0000 mg | ORAL_TABLET | Freq: Every day | ORAL | 0 refills | Status: DC

## 2016-05-18 NOTE — Progress Notes (Signed)
[facility] Progress Note     Subjective: Reports increased energy this AM. BM last night as well as this morning without stimulation by suppository or enema. Denies nausea or vomiting since yesterday AM.   Patient reports that prior to last Friday he required no narcotic medication but over the weekend when his back pain flared up after going back to PT he took ULTRAM, hydrocodone, and robaxin.   Objective: Vital signs in last 24 hours: Temp:  [98.5 F (36.9 C)-99.2 F (37.3 C)] 98.6 F (37 C) (01/07 0538) Pulse Rate:  [72-84] 72 (01/07 0538) Resp:  [16-17] 16 (01/07 0538) BP: (106-130)/(72-76) 130/73 (01/07 0538) SpO2:  [92 %-98 %] 98 % (01/07 0538) Last BM Date: 05/17/16  Intake/Output from previous day: 01/06 0701 - 01/07 0700 In: 720 [P.O.:720] Out: 1000 [Urine:1000] Intake/Output this shift: Total I/O In: 240 [P.O.:240] Out: 250 [Urine:250]  PE: Gen:  Alert, NAD, pleasant Pulm:  CTA, unlabored Abd: Soft, non-tender, mid distention and tympany, +BS  Lab Results:   Recent Labs  05/17/16 0701 05/18/16 0402  WBC 10.3 10.4  HGB 11.9* 11.0*  HCT 36.3* 33.0*  PLT 144* 150   BMET  Recent Labs  05/17/16 0701 05/18/16 0402  NA 131* 131*  K 3.9 3.8  CL 98* 96*  CO2 25 28  GLUCOSE 89 96  BUN 7 9  CREATININE 0.70 0.81  CALCIUM 8.2* 8.0*   PT/INR No results for input(s): LABPROT, INR in the last 72 hours. CMP     Component Value Date/Time   NA 131 (L) 05/18/2016 0402   NA 141 11/29/2015 0857   K 3.8 05/18/2016 0402   K 3.9 11/29/2015 0857   CL 96 (L) 05/18/2016 0402   CO2 28 05/18/2016 0402   CO2 30 (H) 11/29/2015 0857   GLUCOSE 96 05/18/2016 0402   GLUCOSE 88 11/29/2015 0857   BUN 9 05/18/2016 0402   BUN 23.0 11/29/2015 0857   CREATININE 0.81 05/18/2016 0402   CREATININE 1.08 02/18/2016 1537   CREATININE 1.1 11/29/2015 0857   CALCIUM 8.0 (L) 05/18/2016 0402   CALCIUM 8.4 11/29/2015 0857   PROT 5.3 (L) 05/13/2016 1405   PROT 5.8  (L) 11/29/2015 0857   ALBUMIN 3.1 (L) 05/13/2016 1405   ALBUMIN 3.6 11/29/2015 0857   AST 36 05/13/2016 1405   AST 25 11/29/2015 0857   ALT 29 05/13/2016 1405   ALT 30 11/29/2015 0857   ALKPHOS 51 05/13/2016 1405   ALKPHOS 68 11/29/2015 0857   BILITOT 0.9 05/13/2016 1405   BILITOT 0.55 11/29/2015 0857   GFRNONAA >60 05/18/2016 0402   GFRNONAA 85 11/20/2014 1527   GFRAA >60 05/18/2016 0402   GFRAA >89 11/20/2014 1527   Lipase     Component Value Date/Time   LIPASE 12 05/13/2016 1405       Studies/Results: US Scrotum  Result Date: 05/16/2016 CLINICAL DATA:  Right scrotal pain and swelling EXAM: SCROTAL ULTRASOUND DOPPLER ULTRASOUND OF THE TESTICLES TECHNIQUE: Complete ultrasound examination of the testicles, epididymis, and other scrotal structures was performed. Color and spectral Doppler ultrasound were also utilized to evaluate blood flow to the testicles. COMPARISON:  August 17, 2014 FINDINGS: Right testicle Measurements: 4.4 x 2.9 x 3.3 cm. No mass or microlithiasis visualized. Left testicle Measurements: 3.7 x 2.1 x 3.6 cm. No mass or microlithiasis visualized. Right epididymis: The right epididymis appears somewhat edematous and hyperemic. There are cysts in the head of the epididymis, largest measuring 9 x 7 mm. Left epididymis:  Normal in size and appearance. Hydrocele: There is a fairly large right hydrocele containing mild debris. There is a much smaller hydrocele on the left. Varicocele:  None visualized. Pulsed Doppler interrogation of both testes demonstrates normal low resistance arterial and venous waveforms bilaterally. There is calcification along the wall of the left scrotal sac without wall thickening. There is no scrotal abscess on either side. IMPRESSION: Evidence of a degree of epididymitis on the right. Several small cysts noted in the right epididymis. No intratesticular mass or inflammatory focus. No testicular torsion. Evidence of prior inflammation in the left  scrotal sac with calcification along the left scrotal sac. There is again noted a fairly large hydrocele on the right. Mild debris in hydrocele on the right is probably due to the epididymitis. There is a much smaller left hydrocele. Electronically Signed   By: Lowella Grip III M.D.   On: 05/16/2016 21:30   [place] Art/ven Flow Abd Pelv Doppler  Result Date: 05/16/2016 CLINICAL DATA:  Right scrotal pain and swelling EXAM: SCROTAL ULTRASOUND DOPPLER ULTRASOUND OF THE TESTICLES TECHNIQUE: Complete ultrasound examination of the testicles, epididymis, and other scrotal structures was performed. Color and spectral Doppler ultrasound were also utilized to evaluate blood flow to the testicles. COMPARISON:  August 17, 2014 FINDINGS: Right testicle Measurements: 4.4 x 2.9 x 3.3 cm. No mass or microlithiasis visualized. Left testicle Measurements: 3.7 x 2.1 x 3.6 cm. No mass or microlithiasis visualized. Right epididymis: The right epididymis appears somewhat edematous and hyperemic. There are cysts in the head of the epididymis, largest measuring 9 x 7 mm. Left epididymis:  Normal in size and appearance. Hydrocele: There is a fairly large right hydrocele containing mild debris. There is a much smaller hydrocele on the left. Varicocele:  None visualized. Pulsed Doppler interrogation of both testes demonstrates normal low resistance arterial and venous waveforms bilaterally. There is calcification along the wall of the left scrotal sac without wall thickening. There is no scrotal abscess on either side. IMPRESSION: Evidence of a degree of epididymitis on the right. Several small cysts noted in the right epididymis. No intratesticular mass or inflammatory focus. No testicular torsion. Evidence of prior inflammation in the left scrotal sac with calcification along the left scrotal sac. There is again noted a fairly large hydrocele on the right. Mild debris in hydrocele on the right is probably due to the epididymitis. There is  a much smaller left hydrocele. Electronically Signed   By: Lowella Grip III M.D.   On: 05/16/2016 21:30   Dg Abd Portable 2v  Result Date: 05/17/2016 CLINICAL DATA:  66 year old male with abdominal and vomiting. Chronic severe abdominal pain. Patient uses narcotics daily. EXAM: PORTABLE ABDOMEN - 2 VIEW COMPARISON:  Prior abdominal radiograph 05/15/2016 FINDINGS: Incompletely imaged cardiomegaly. Patient is status post median sternotomy with evidence of prior multivessel CABG. Low inspiratory volumes with nonspecific patchy right lower lobe airspace opacity which could reflect atelectasis or infiltrate. No evidence of free air on the up right images. Unchanged bowel gas pattern. Persisting gas within a minimally dilated loops small bowel in the right abdomen. Advanced multilevel degenerative disc disease with levoconvex scoliosis and surgical changes of prior posterior lumbar interbody fusion. IMPRESSION: 1. Unchanged bowel gas pattern with gas in multiple loops of minimally dilated small bowel in the right mid abdomen. Ileus versus partial small bowel obstruction. 2. Low inspiratory volumes with nonspecific right basilar patchy airspace opacity which may reflect atelectasis or infiltrate. Electronically Signed   By: Dellis Filbert.D.  On: 05/17/2016 11:03    Anti-infectives: Anti-infectives    Start     Dose/Rate Route Frequency Ordered Stop   05/16/16 2130  azithromycin (ZITHROMAX) tablet 500 mg     500 mg Oral Every 24 hours 05/16/16 1315     05/14/16 2200  azithromycin (ZITHROMAX) 500 mg in dextrose 5 % 250 mL IVPB  Status:  Discontinued     500 mg 250 mL/hr over 60 Minutes Intravenous Every 24 hours 05/14/16 2152 05/16/16 1315   05/14/16 1000  valACYclovir (VALTREX) tablet 500 mg     500 mg Oral Daily 05/13/16 2321     05/14/16 1000  sulfamethoxazole-trimethoprim (BACTRIM DS,SEPTRA DS) 800-160 MG per tablet 1 tablet     1 tablet Oral Daily 05/13/16 2321     05/14/16 0800   elvitegravir-cobicistat-emtricitabine-tenofovir (GENVOYA) 150-150-200-10 MG tablet 1 tablet     1 tablet Oral Daily with breakfast 05/13/16 2321     05/14/16 0800  Darunavir Ethanolate (PREZISTA) tablet 800 mg     800 mg Oral Daily with breakfast 05/13/16 2321     05/14/16 0000  clindamycin (CLEOCIN) capsule 300 mg     300 mg Oral Every 6 hours 05/13/16 2322 05/18/16 2359     Assessment/Plan Abdominal pain and vomiting.  Ileus secondary to inactivity and narcotic seem more likely than SBO - Abd film 1/6 unchanged bowel/gas pattern with multiple minimally dilated SB loops in R mid abdomen. - today patient with increased bowel sounds, return of bowel function, and decreased nausea - advance diet as tolerated  HIV-positive CLL CAD status post CABG Rheumatoid arthritis-on low-dose prednisone. Severe degenerative disc disease with low back pain status post fusion.  Neurosurgery states no indication for intervention.  Plan: increased bowel function. Will advance to full liquid diet at lunch and diet may be advanced as tolerated.  Recommend continuing to minimize narcotic medications, increase activity as tolerated, drink plenty of water, and take stool softeners/miralax as needed to avoid constipation.   General surgery will sign off but will be available as needed.    LOS: 4 days    Jill Alexanders , [facility] 05/18/2016, 11:11 AM Pager: [phone] Consults: [phone] Mon-Fri 7:00 am-4:30 pm Sat-Sun 7:00 am-11:30 am

## 2016-05-18 NOTE — Progress Notes (Signed)
Pt has been in bed this shift today. Watching TV. No distress. Meds taken without difficulty.

## 2016-05-18 NOTE — Progress Notes (Signed)
Patient ID: [name], male   DOB: 13-Jun-1950, 66 y.o.   MRN: [id number]    PROGRESS NOTE    [name]  [id number] DOB: 27-Sep-1950 DOA: 05/13/2016  PCP: Christopher Calico, MD   Brief Narrative:   66 y.o. male with medical history significant for HIV, CLL, CAD status post CABG, rheumatoid arthritis, and degenerative disc disease with chronic low back pain status post posterior fusion, presenting to the emergency department with acute worsening of his chronic low back pain. Patient reports that he was in his usual state of health until 05/09/2016 when he experienced insidious worsening in his chronic low back pain. There was no precipitating event that he could recall, no fall or trauma, and no heavy lifting or increased exertion.  Assessment & Plan:   1. Acute on chronic low-back pain, intractable  - Pt with chronic LBP s/p fusion  - There was no precipitating event identified; no fevers or wt-loss reported; no incontinence or saddle paraesthesia  - CT L-spine with stable chronic changes; no mass, infection, or significant canal stenosis noted  - MRI L-spine with no specific acute finding to explain the cause  - d/w neurosurgery, no indication for an intervention  - pt denies pain this AM  2. Cellulitis, LLE  - Mild non-purulent cellulitis with penicillin allergy, started on empiric clindamycin and will continue same  - Underlying DVT excluded with venous dopplers, notable for left inguinal adenopathy   3. HIV  - CD4 was 1,140 and VL 31 in October 2017  - Continue current management with Prezista and Genvoya    4. Hx of recurrent DVT  - Continue Savaysa  - There is LLE edema, possibly related to post-thrombotic syndrome or pelvic and retroperitoneal adenopathy  - US performed and negative for DVT    5. CLL  - Pt continues to follow with oncology, not currently on any treatments  - CT abd/pelvis demonstrates a mild increase in retroperitoneal and pelvic adenopathy  -  outpatient follow up  6. CAD - No anginal complaints  - Continue ASA, Lipitor, Toprol    7. Hypertension, essential  - stable this AM 129/76 mmHg  8. Rheumatoid arthritis  - Continue prednisone 5 mg daily   9. Incidental lung nodule  - 5 mm LLL nodule noted on CT   10. LLL PNA - Zithromax added 1/3, continue same regimen   11. Abd distension - ileus vs pSBO - placed on bowel regimen, had one BM yesterday and one BM this AM  - advance diet to full liquids   12. Hyponatremia - appears to be pre renal  - improving - BMP In AM  13. Right testicular swelling - testicular US ordered, will follow up on results   14. Thrombocytopenia - chronic  - overall stable with no signs of active bleeding   15. Obesity  - Body mass index is 33.49 kg/m  16. Hypokalemia - mild, supplemented and WNL this AM    DVT prophylaxis: Edoxaban Code Status: Full  Family Communication: Patient at bedside  Disposition Plan: Home in AM if tolerating diet well   Consultants:   Neurosurgery over the phone  Surgery   Procedures:   None  Antimicrobials:   Clindamycin 1/2 -->  Zithromax 1/3 -->  Subjective: Persistent back pain and abd distension.   Objective: Vitals:   05/17/16 2006 05/18/16 0538 05/18/16 1329 05/18/16 1353  BP: 129/76 130/73 128/69 112/62  Pulse: 79 72 89 65  Resp: 16 16 18  Temp: 99.2 F (37.3 C) 98.6 F (37 C) 98.1 F (36.7 C) 97.6 F (36.4 C)  TempSrc: Oral Oral    SpO2: 95% 98% 100% 99%  Weight:      Height:        Intake/Output Summary (Last 24 hours) at 05/18/16 1440 Last data filed at 05/18/16 1315  Gross per 24 hour  Intake              480 ml  Output             1100 ml  Net             -620 ml   Filed Weights   05/13/16 2300  Weight: 91.3 kg (201 lb 4.5 oz)    Examination:  General exam: Appears calm and comfortable  Respiratory system: Clear to auscultation. Respiratory effort normal. Mild rhonchi at bases. Cardiovascular  system: S1 & S2 heard, RRR. No JVD, murmurs, rubs, gallops or clicks. No pedal edema. Gastrointestinal system: Abdomen is diistended, soft and nontender. No organomegaly or masses felt.  Central nervous system: Alert and oriented.   Data Reviewed: I have personally reviewed following labs and imaging studies  CBC:  Recent Labs Lab 05/13/16 1405 05/15/16 0831 05/16/16 0813 05/17/16 0701 05/18/16 0402  WBC 10.3 9.1 7.6 10.3 10.4  NEUTROABS 3.6  --   --   --   --   HGB 12.2* 11.1* 11.4* 11.9* 11.0*  HCT 37.4* 33.4* 34.9* 36.3* 33.0*  MCV 86.2 84.3 85.1 84.6 84.6  PLT 117* 114* 118* 144* [id number]   Basic Metabolic Panel:  Recent Labs Lab 05/14/16 0647 05/15/16 0831 05/16/16 0813 05/17/16 0701 05/18/16 0402  NA 136 129* 134* 131* 131*  K 3.5 3.7 3.4* 3.9 3.8  CL 99* 96* 100* 98* 96*  CO2 25 27 26 25 28   GLUCOSE 74 100* 129* 89 96  BUN 12 11 7 7 9   CREATININE 0.93 0.96 0.82 0.70 0.81  CALCIUM 8.3* 7.9* 8.1* 8.2* 8.0*   Liver Function Tests:  Recent Labs Lab 05/13/16 1405  AST 36  ALT 29  ALKPHOS 51  BILITOT 0.9  PROT 5.3*  ALBUMIN 3.1*    Recent Labs Lab 05/13/16 1405  LIPASE 12   CBG:  Recent Labs Lab 05/14/16 0653 05/14/16 0710 05/15/16 0626 05/16/16 0633  GLUCAP 64* 76 87 101*   Urine analysis:    Component Value Date/Time   COLORURINE AMBER (A) 05/13/2016 1754   APPEARANCEUR CLEAR 05/13/2016 1754   LABSPEC >1.046 (H) 05/13/2016 1754   PHURINE 5.0 05/13/2016 1754   GLUCOSEU NEGATIVE 05/13/2016 1754   HGBUR NEGATIVE 05/13/2016 1754   BILIRUBINUR NEGATIVE 05/13/2016 1754   KETONESUR 20 (A) 05/13/2016 1754   PROTEINUR 30 (A) 05/13/2016 1754   UROBILINOGEN 1.0 01/17/2015 1218   NITRITE NEGATIVE 05/13/2016 1754   LEUKOCYTESUR NEGATIVE 05/13/2016 1754   Radiology Studies: US Scrotum  Result Date: 05/16/2016 CLINICAL DATA:  Right scrotal pain and swelling EXAM: SCROTAL ULTRASOUND DOPPLER ULTRASOUND OF THE TESTICLES TECHNIQUE: Complete ultrasound  examination of the testicles, epididymis, and other scrotal structures was performed. Color and spectral Doppler ultrasound were also utilized to evaluate blood flow to the testicles. COMPARISON:  August 17, 2014 FINDINGS: Right testicle Measurements: 4.4 x 2.9 x 3.3 cm. No mass or microlithiasis visualized. Left testicle Measurements: 3.7 x 2.1 x 3.6 cm. No mass or microlithiasis visualized. Right epididymis: The right epididymis appears somewhat edematous and hyperemic. There are cysts in the head of the epididymis,  largest measuring 9 x 7 mm. Left epididymis:  Normal in size and appearance. Hydrocele: There is a fairly large right hydrocele containing mild debris. There is a much smaller hydrocele on the left. Varicocele:  None visualized. Pulsed Doppler interrogation of both testes demonstrates normal low resistance arterial and venous waveforms bilaterally. There is calcification along the wall of the left scrotal sac without wall thickening. There is no scrotal abscess on either side. IMPRESSION: Evidence of a degree of epididymitis on the right. Several small cysts noted in the right epididymis. No intratesticular mass or inflammatory focus. No testicular torsion. Evidence of prior inflammation in the left scrotal sac with calcification along the left scrotal sac. There is again noted a fairly large hydrocele on the right. Mild debris in hydrocele on the right is probably due to the epididymitis. There is a much smaller left hydrocele. Electronically Signed   By: Lowella Grip III M.D.   On: 05/16/2016 21:30   [place] Art/ven Flow Abd Pelv Doppler  Result Date: 05/16/2016 CLINICAL DATA:  Right scrotal pain and swelling EXAM: SCROTAL ULTRASOUND DOPPLER ULTRASOUND OF THE TESTICLES TECHNIQUE: Complete ultrasound examination of the testicles, epididymis, and other scrotal structures was performed. Color and spectral Doppler ultrasound were also utilized to evaluate blood flow to the testicles. COMPARISON:  August 17, 2014 FINDINGS: Right testicle Measurements: 4.4 x 2.9 x 3.3 cm. No mass or microlithiasis visualized. Left testicle Measurements: 3.7 x 2.1 x 3.6 cm. No mass or microlithiasis visualized. Right epididymis: The right epididymis appears somewhat edematous and hyperemic. There are cysts in the head of the epididymis, largest measuring 9 x 7 mm. Left epididymis:  Normal in size and appearance. Hydrocele: There is a fairly large right hydrocele containing mild debris. There is a much smaller hydrocele on the left. Varicocele:  None visualized. Pulsed Doppler interrogation of both testes demonstrates normal low resistance arterial and venous waveforms bilaterally. There is calcification along the wall of the left scrotal sac without wall thickening. There is no scrotal abscess on either side. IMPRESSION: Evidence of a degree of epididymitis on the right. Several small cysts noted in the right epididymis. No intratesticular mass or inflammatory focus. No testicular torsion. Evidence of prior inflammation in the left scrotal sac with calcification along the left scrotal sac. There is again noted a fairly large hydrocele on the right. Mild debris in hydrocele on the right is probably due to the epididymitis. There is a much smaller left hydrocele. Electronically Signed   By: Lowella Grip III M.D.   On: 05/16/2016 21:30   Dg Abd Portable 2v  Result Date: 05/17/2016 CLINICAL DATA:  66 year old male with abdominal and vomiting. Chronic severe abdominal pain. Patient uses narcotics daily. EXAM: PORTABLE ABDOMEN - 2 VIEW COMPARISON:  Prior abdominal radiograph 05/15/2016 FINDINGS: Incompletely imaged cardiomegaly. Patient is status post median sternotomy with evidence of prior multivessel CABG. Low inspiratory volumes with nonspecific patchy right lower lobe airspace opacity which could reflect atelectasis or infiltrate. No evidence of free air on the up right images. Unchanged bowel gas pattern. Persisting gas within  a minimally dilated loops small bowel in the right abdomen. Advanced multilevel degenerative disc disease with levoconvex scoliosis and surgical changes of prior posterior lumbar interbody fusion. IMPRESSION: 1. Unchanged bowel gas pattern with gas in multiple loops of minimally dilated small bowel in the right mid abdomen. Ileus versus partial small bowel obstruction. 2. Low inspiratory volumes with nonspecific right basilar patchy airspace opacity which may reflect atelectasis or infiltrate.  Electronically Signed   By: Jacqulynn Cadet M.D.   On: 05/17/2016 11:03    Scheduled Meds: . aspirin EC  81 mg Oral Daily  . atorvastatin  10 mg Oral QHS  . azithromycin  500 mg Oral Q24H  . clindamycin  300 mg Oral Q6H  . Darunavir Ethanolate  800 mg Oral Q breakfast  . edoxaban  30 mg Oral Daily  . elvitegravir-cobicistat-emtricitabine-tenofovir  1 tablet Oral Q breakfast  . erythromycin   Both Eyes Q6H  . escitalopram  10 mg Oral Daily  . guaiFENesin  600 mg Oral BID  . ketorolac  15 mg Intravenous Q6H  . loratadine  10 mg Oral Daily  . metoprolol succinate  25 mg Oral Daily  . pantoprazole  40 mg Oral BID AC  . predniSONE  5 mg Oral Q breakfast  . pregabalin  200 mg Oral BID  . saccharomyces boulardii  250 mg Oral BID  . senna-docusate  1 tablet Oral BID  . sodium chloride flush  3 mL Intravenous Q12H  . sulfamethoxazole-trimethoprim  1 tablet Oral Daily  . valACYclovir  500 mg Oral Daily   Continuous Infusions:   LOS: 4 days   Time spent: 20 minutes   Faye Ramsay, MD [facility] Pager [phone]  If 7PM-7AM, please contact night-coverage [email or web address] Password [email or web address] 05/18/2016, 2:40 PM

## 2016-05-19 ENCOUNTER — Inpatient Hospital Stay (HOSPITAL_COMMUNITY): Payer: Medicare Other

## 2016-05-19 ENCOUNTER — Telehealth: Payer: Self-pay

## 2016-05-19 LAB — CBC
HCT: 37.6 % — ABNORMAL LOW (ref 39.0–52.0)
Hemoglobin: 12.5 g/dL — ABNORMAL LOW (ref 13.0–17.0)
MCH: 27.9 pg (ref 26.0–34.0)
MCHC: 33.2 g/dL (ref 30.0–36.0)
MCV: 83.9 fL (ref 78.0–100.0)
Platelets: 185 10*3/uL (ref 150–400)
RBC: 4.48 MIL/uL (ref 4.22–5.81)
RDW: 15.5 % (ref 11.5–15.5)
WBC: 9.2 10*3/uL (ref 4.0–10.5)

## 2016-05-19 LAB — BASIC METABOLIC PANEL
Anion gap: 8 (ref 5–15)
BUN: 6 mg/dL (ref 6–20)
CO2: 29 mmol/L (ref 22–32)
Calcium: 8.3 mg/dL — ABNORMAL LOW (ref 8.9–10.3)
Chloride: 95 mmol/L — ABNORMAL LOW (ref 101–111)
Creatinine, Ser: 0.86 mg/dL (ref 0.61–1.24)
GFR calc Af Amer: >60 mL/min (ref >60)
GFR calc non Af Amer: >60 mL/min (ref >60)
Glucose, Bld: 115 mg/dL — ABNORMAL HIGH (ref 65–99)
Potassium: 3.4 mmol/L — ABNORMAL LOW (ref 3.5–5.1)
Sodium: 132 mmol/L — ABNORMAL LOW (ref 135–145)

## 2016-05-19 MED ORDER — POTASSIUM CHLORIDE CRYS ER 20 MEQ PO TBCR
40.0000 meq | EXTENDED_RELEASE_TABLET | Freq: Once | ORAL | Status: AC
Start: 2016-05-19 — End: 2016-05-19
  Administered 2016-05-19: 40 meq via ORAL
  Filled 2016-05-19: qty 2

## 2016-05-19 MED ORDER — LEVOFLOXACIN 500 MG PO TABS
750.0000 mg | ORAL_TABLET | Freq: Every day | ORAL | Status: DC
  Administered 2016-05-20: 750 mg via ORAL
  Filled 2016-05-19: qty 2

## 2016-05-19 MED ORDER — POTASSIUM CHLORIDE CRYS ER 20 MEQ PO TBCR
20.0000 meq | EXTENDED_RELEASE_TABLET | Freq: Once | ORAL | Status: AC
Start: 2016-05-19 — End: 2016-05-19
  Administered 2016-05-19: 20 meq via ORAL
  Filled 2016-05-19: qty 1

## 2016-05-19 NOTE — Telephone Encounter (Signed)
Patient called from hospital room, frantically requesting that dr Ronnald Ramp do something about his discharge today---he states he cannot go home and care for himself and he states hospitalist is not arranging home health---dr jones is entering referral for home health

## 2016-05-19 NOTE — Progress Notes (Signed)
Orthopedic Tech Progress Note Patient Details:  [name] [birth year] [id number]  Patient ID: [name], male   DOB: 11-Sep-1950, 66 y.o.   MRN: [id number]   [name] 05/19/2016, 11:15 AMCalled [employer] for TLSO brace.

## 2016-05-19 NOTE — Care Management Important Message (Signed)
Important Message  Patient Details  Name: [name] MRN: [id number] Date of Birth: Feb 17, 1951   Medicare Important Message Given:  Yes    Duell Holdren Montine Circle 05/19/2016, 3:41 PM

## 2016-05-19 NOTE — Progress Notes (Signed)
Pt brought down to MRI for scan.  Pt was laying semi upright on his left side.  Pt stated he could not lay down or on his back.  RN notified and were directed to send him back up to the floor.

## 2016-05-19 NOTE — Progress Notes (Signed)
PT Cancellation Note  Patient Details Name: [name] MRN: [id number] DOB: 10-Sep-1950   Cancelled Treatment:    Reason Eval/Treat Not Completed: Other (comment).  Per pt Dr. Saintclair Halsted came by and recommended he not get up (no orders for bedrest, only new order for TLSO).  Pt refusing therapy at this time and wants to wait until after his surgery to mobilize with PT again.  Pt seemed to think his surgery would be later this week or early next week.  PT will continue to follow acutely as we have not been ordered to stop.  I will continue to check in with him daily and see if having the TLSO (not here yet) makes a difference in his ability to mobilize.  He did walk with me on Thursday 05/15/16 and RN reports he has been getting back and forth to the [facility].    PT will check in tomorrow.  Thanks,    Barbarann Ehlers. Deangelo Berns, PT, DPT [phone]   05/19/2016, 10:34 AM

## 2016-05-19 NOTE — Progress Notes (Signed)
Pharmacy Antibiotic Note  [name] is a 66 y.o. male admitted on 05/13/2016 with CAP and epididymitis.  Pharmacy has been consulted for Levaquin oral dosing.  Plan: Levaquin 750 mg po daily.  Monitor renal function, culture result, and clinical status.  Follow-up stop date  Height: 5\' 5"  (165.1 cm) Weight: 121 lb 7.6 oz (55.1 kg) IBW/kg (Calculated) : 61.5  Temp (24hrs), Avg:98.3 F (36.8 C), Min:98.2 F (36.8 C), Max:98.4 F (36.9 C)   Recent Labs Lab 05/13/16 1510  05/15/16 0831 05/16/16 0813 05/17/16 0701 05/18/16 0402 05/19/16 0817  WBC  --   --  9.1 7.6 10.3 10.4 9.2  CREATININE  --   < > 0.96 0.82 0.70 0.81 0.86  LATICACIDVEN 1.28  --   --   --   --   --   --   < > = values in this interval not displayed.  Estimated Creatinine Clearance: 64.1 mL/min (by C-G formula based on SCr of 0.86 mg/dL).    Allergies  Allergen Reactions  . Morphine Other (See Comments)    REACTION: severe headache  . Other Anaphylaxis and Hives    Pecan  . Peanut-Containing Drug Products Anaphylaxis and Hives    Swelling of throat  . Oxycodone-Acetaminophen Other (See Comments)    REACTION: headache  . Penicillins Rash    Has patient had a PCN reaction causing immediate rash, facial/tongue/throat swelling, SOB or lightheadedness with hypotension: Yes Has patient had a PCN reaction causing severe rash involving mucus membranes or skin necrosis: No Has patient had a PCN reaction that required hospitalization No Has patient had a PCN reaction occurring within the last 10 years: No If all of the above answers are "NO", then may proceed with Cephalosporin use.   REACTION: red, flushed  . Promethazine Hcl Other (See Comments)    REACTION: makes him feel drunk    Antimicrobials this admission:  Clinda 1/3 >>1/8 Septra PTA >> Valtrex PTA >> Azith 1/3 >>1/7 Levaquin 1/8 >>  Dose adjustments this admission:  N/A  Microbiology results:  1/6 resp panel: negative  Thank you  for allowing pharmacy to be a part of this patient's care.  Sloan Leiter, PharmD, BCPS Clinical Pharmacist [phone] until 11 PM tonight [phone] after hours 05/19/2016 3:43 PM

## 2016-05-19 NOTE — Progress Notes (Addendum)
Patient ID: EUGEAN STAKE, male   DOB: 10/04/1950, 66 y.o.   MRN: [id number]    PROGRESS NOTE    BRALYNN BHATTI  [id number] DOB: 09-Apr-1951 DOA: 05/13/2016  PCP: [name], MD   Brief Narrative:   66 y.o. male with medical history significant for HIV, CLL, CAD status post CABG, rheumatoid arthritis, and degenerative disc disease with chronic low back pain status post posterior fusion, presenting to the emergency department with acute worsening of his chronic low back pain. Patient reports that he was in his usual state of health until 05/09/2016 when he experienced insidious worsening in his chronic low back pain. There was no precipitating event that he could recall, no fall or trauma, and no heavy lifting or increased exertion.  Assessment & Plan:   1. Acute on chronic mid and low-back pain, intractable  - Pt with chronic LBP s/p fusion  - There was no precipitating event identified; no fevers or wt-loss reported; no incontinence or saddle paraesthesia  - CT L-spine with stable chronic changes; no mass, infection, or significant canal stenosis noted  - MRI L-spine with no specific acute finding to explain the cause  - d/w neurosurgery, no indication for an intervention, appreciate assistance  - MRI thoracic spine also pending at this time  - PT eval done, initially no recommendations - before we can place order for [facility], we need PT to specify what is needed   2. Cellulitis, LLE  - Mild non-purulent cellulitis with penicillin allergy, started on empiric clindamycin and will continue same  - Underlying DVT excluded with venous dopplers, notable for left inguinal adenopathy  - ABX change to Levaquin PO  3. HIV  - CD4 was 1,140 and VL 31 in October 2017  - Continue current management with Prezista and Genvoya    4. Hx of recurrent DVT  - Continue Savaysa  - There is LLE edema, possibly related to post-thrombotic syndrome or pelvic and retroperitoneal adenopathy  - US performed  and negative for DVT    5. CLL  - Pt continues to follow with oncology, not currently on any treatments  - CT abd/pelvis demonstrates a mild increase in retroperitoneal and pelvic adenopathy  - outpatient follow up  6. CAD - No anginal complaints  - Continue ASA, Lipitor, Toprol    7. Hypertension, essential  - stable this AM 129/76 mmHg  8. Rheumatoid arthritis  - Continue prednisone 5 mg daily   9. Incidental lung nodule  - 5 mm LLL nodule noted on CT   10. LLL PNA - Zithromax added 1/3, will change to Levaquin as pt with epididymitis and Levaquin is better coverage  - will need two more days of Levaquin   11. Abd distension - ileus vs pSBO - placed on bowel regimen, had BM this AM and yesterday - advanced diet to soft  - appears to be resolved at this point - surgery team has signed off   12. Hyponatremia - appears to be pre renal  - improving - BMP In AM  13. Right testicular swelling - testicular US with evidence of a degree of epididymitis on the right, several small cysts noted in the right epididymis. No intratesticular mass or inflammatory focus. No testicular torsion. Evidence of prior inflammation in the left scrotal sac with calcification along the left scrotal sac. - There is again noted a fairly large hydrocele on the right. Mild debris in hydrocele on the right is probably due to the epididymitis -  will change ABX to Levaquin, will discuss with urology   14. Thrombocytopenia - chronic  - overall stable with no signs of active bleeding  - Plt WNL this AM  15. Obesity  - Body mass index is 33.49 kg/m  16. Hypokalemia - mild, supplement - BMP in AM  DVT prophylaxis: Edoxaban Code Status: Full  Family Communication: Patient at bedside  Disposition Plan: Home once back pain improves   Consultants:   Neurosurgery over the phone  Surgery   Procedures:   None  Antimicrobials:   Clindamycin 1/2 --> 1/8  Zithromax 1/3 -->  1/8  Levaquin 1/8 -->  Subjective: Persistent back pain and mid and lower area, 7/10 in severity.   Objective: Vitals:   05/18/16 1329 05/18/16 1353 05/18/16 2046 05/19/16 0513  BP: 128/69 112/62 134/72 (!) 143/72  Pulse: 89 65 78 84  Resp: 18  18 18   Temp: 98.1 F (36.7 C) 97.6 F (36.4 C) 98.2 F (36.8 C) 98.4 F (36.9 C)  TempSrc:   Oral Oral  SpO2: 100% 99% 95% 93%  Weight:    55.1 kg (121 lb 7.6 oz)  Height:        Intake/Output Summary (Last 24 hours) at 05/19/16 1510 Last data filed at 05/19/16 1200  Gross per 24 hour  Intake              960 ml  Output              700 ml  Net              260 ml   Filed Weights   05/13/16 2300 05/19/16 0513  Weight: 91.3 kg (201 lb 4.5 oz) 55.1 kg (121 lb 7.6 oz)    Examination:  General exam: Appears to be in pain this AM Respiratory system: Clear to auscultation. Respiratory effort normal. Mild rhonchi at bases. Cardiovascular system: S1 & S2 heard, RRR. No JVD, murmurs, rubs, gallops or clicks. No pedal edema. Gastrointestinal system: Abdomen is non diistended, soft and nontender. No organomegaly or masses felt.  Central nervous system: Alert and oriented.   Data Reviewed: I have personally reviewed following labs and imaging studies  CBC:  Recent Labs Lab 05/13/16 1405 05/15/16 0831 05/16/16 0813 05/17/16 0701 05/18/16 0402 05/19/16 0817  WBC 10.3 9.1 7.6 10.3 10.4 9.2  NEUTROABS 3.6  --   --   --   --   --   HGB 12.2* 11.1* 11.4* 11.9* 11.0* 12.5*  HCT 37.4* 33.4* 34.9* 36.3* 33.0* 37.6*  MCV 86.2 84.3 85.1 84.6 84.6 83.9  PLT 117* 114* 118* 144* 150 [id number]   Basic Metabolic Panel:  Recent Labs Lab 05/15/16 0831 05/16/16 0813 05/17/16 0701 05/18/16 0402 05/19/16 0817  NA 129* 134* 131* 131* 132*  K 3.7 3.4* 3.9 3.8 3.4*  CL 96* 100* 98* 96* 95*  CO2 27 26 25 28 29   GLUCOSE 100* 129* 89 96 115*  BUN 11 7 7 9 6   CREATININE 0.96 0.82 0.70 0.81 0.86  CALCIUM 7.9* 8.1* 8.2* 8.0* 8.3*   Liver  Function Tests:  Recent Labs Lab 05/13/16 1405  AST 36  ALT 29  ALKPHOS 51  BILITOT 0.9  PROT 5.3*  ALBUMIN 3.1*    Recent Labs Lab 05/13/16 1405  LIPASE 12   CBG:  Recent Labs Lab 05/14/16 0653 05/14/16 0710 05/15/16 0626 05/16/16 0633  GLUCAP 64* 76 87 101*   Urine analysis:    Component Value Date/Time  COLORURINE AMBER (A) 05/13/2016 1754   APPEARANCEUR CLEAR 05/13/2016 1754   LABSPEC >1.046 (H) 05/13/2016 1754   PHURINE 5.0 05/13/2016 1754   GLUCOSEU NEGATIVE 05/13/2016 1754   HGBUR NEGATIVE 05/13/2016 1754   BILIRUBINUR NEGATIVE 05/13/2016 1754   KETONESUR 20 (A) 05/13/2016 1754   PROTEINUR 30 (A) 05/13/2016 1754   UROBILINOGEN 1.0 01/17/2015 1218   NITRITE NEGATIVE 05/13/2016 1754   LEUKOCYTESUR NEGATIVE 05/13/2016 1754   Radiology Studies: No results found.  Scheduled Meds: . aspirin EC  81 mg Oral Daily  . atorvastatin  10 mg Oral QHS  . azithromycin  500 mg Oral Q24H  . Darunavir Ethanolate  800 mg Oral Q breakfast  . edoxaban  30 mg Oral Daily  . elvitegravir-cobicistat-emtricitabine-tenofovir  1 tablet Oral Q breakfast  . erythromycin   Both Eyes Q6H  . escitalopram  10 mg Oral Daily  . guaiFENesin  600 mg Oral BID  . loratadine  10 mg Oral Daily  . metoprolol succinate  25 mg Oral Daily  . pantoprazole  40 mg Oral BID AC  . predniSONE  5 mg Oral Q breakfast  . pregabalin  200 mg Oral BID  . saccharomyces boulardii  250 mg Oral BID  . senna-docusate  1 tablet Oral BID  . sodium chloride flush  3 mL Intravenous Q12H  . sulfamethoxazole-trimethoprim  1 tablet Oral Daily  . valACYclovir  500 mg Oral Daily   Continuous Infusions:   LOS: 5 days   Time spent: 20 minutes   Faye Ramsay, MD [facility] Pager [phone]  If 7PM-7AM, please contact night-coverage [email or web address] Password [email or web address] 05/19/2016, 3:10 PM

## 2016-05-19 NOTE — Consult Note (Signed)
Reason for Consult: Back pain Referring Physician: [facility]  [name] is an 66 y.o. male.  HPI: Patient is 66 year old gentleman with long-standing history of chronic low back pain he's been fused from L2 down to the sacrum historically came to me a couple years ago for fluid collection centered around a loose L2 screw that we removed patient currently is admitted the hospital with a diagnosis of pneumonia and an ileus. Still complaining of severe low back pain some radiation both hips but not down his legs to his feet. Workup extensively with CT scans and MRI scan shows little to no change in his anatomy however there is some mild to moderate stenosis and some evidence of progressive degenerative disc disease above his fusion without overt sign of infection.  Past Medical History:  Diagnosis Date  . Carotid artery occlusion    40-60% right ICA stenosis (09/2008)  . Chronic back pain    follows with Nsurg  . Chronic back pain   . CLL (chronic lymphoblastic leukemia) dx 2010   Followed at [facility] q78mo no current therapy   . Coronary artery disease 2010   s/p CABG '10, sees Dr. HPercival Spanish . Diverticulosis   . DVT, lower extremity, recurrent ([place] 2008, [place]   LLE, chronic anticoag since 2009  . Esophagitis   . Fibromyalgia   . Gallstones   . GERD (gastroesophageal reflux disease)   . Gout   . Gynecomastia, male   . H/O hiatal hernia 2008   surgery  . Hemorrhoids   . Hepatitis A yrs ago  . [place]  . Hypertension   . Impotence of organic origin   . Myocardial infarction 2010    x 2  . Neuromuscular disorder (HCC)    neuropathy  . Osteoarthritis, knee    s/p B TKA  . Pneumonia mrach, may, july 2017  . Rheumatoid arthritis(714.0) dx 2010   MTX, follows with rheum  . Seasonal allergies   . Secondary syphilis 07/24/14 dx   s/p 2 wks doxy  . Status post dilation of esophageal narrowing   . TIA (transient ischemic attack) 1997   mild residual L  mouth droop  . Tubular adenoma of colon     Past Surgical History:  Procedure Laterality Date  . CHOLECYSTECTOMY    . COLONOSCOPY WITH PROPOFOL N/A 12/28/2012   Procedure: COLONOSCOPY WITH PROPOFOL;  Surgeon: JJerene Bears MD;  Location: [facility];  Service: Gastroenterology;  Laterality: N/A;  . CORONARY ARTERY BYPASS GRAFT  2010   triple bypass  . ESOPHAGOGASTRODUODENOSCOPY (EGD) WITH PROPOFOL N/A 12/28/2012   Procedure: ESOPHAGOGASTRODUODENOSCOPY (EGD) WITH PROPOFOL;  Surgeon: JJerene Bears MD;  Location: [facility];  Service: Gastroenterology;  Laterality: N/A;  . ESOPHAGOGASTRODUODENOSCOPY (EGD) WITH PROPOFOL N/A 03/15/2013   Procedure: ESOPHAGOGASTRODUODENOSCOPY (EGD) WITH PROPOFOL;  Surgeon: JJerene Bears MD;  Location: [facility];  Service: Gastroenterology;  Laterality: N/A;  . ESOPHAGOGASTRODUODENOSCOPY (EGD) WITH PROPOFOL N/A 02/07/2016   Procedure: ESOPHAGOGASTRODUODENOSCOPY (EGD) WITH PROPOFOL;  Surgeon: JJerene Bears MD;  Location: [facility];  Service: Gastroenterology;  Laterality: N/A;  . HARDWARE REMOVAL N/A 07/02/2012   Procedure: HARDWARE REMOVAL;  Surgeon: GElaina Hoops MD;  [facility];  Service: Neurosurgery;  Laterality: N/A;  . HIATAL HERNIA REPAIR     wrap  . INGUINAL HERNIA REPAIR Bilateral   . JOINT REPLACEMENT Left 1999  . KNEE ARTHROPLASTY  07/22/2011   Procedure: COMPUTER ASSISTED TOTAL KNEE ARTHROPLASTY;  Surgeon:  Meredith Pel, MD;  Location: [place];  Service: Orthopedics;  Laterality: Right;  Right total knee arthroplasty  . MANDIBLE SURGERY Bilateral    tmj  . REPLACEMENT TOTAL KNEE Bilateral   . ring around testicle hernia reapir  184 and 1986   x 2  . ROTATOR CUFF REPAIR Right   . SHOULDER SURGERY Left   . SPINE SURGERY  2010   "rod and screws", "failed", lopwer spine,   . stent to heart x 1  2010  . TONSILLECTOMY    . UMBILICAL HERNIA REPAIR     x 1  . varicose vein     stripping  . VIDEO BRONCHOSCOPY Bilateral 10/16/2015    Procedure: VIDEO BRONCHOSCOPY WITHOUT FLUORO;  Surgeon: Brand Males, MD;  Location: [facility];  Service: Cardiopulmonary;  Laterality: Bilateral;    Family History  Problem Relation Age of Onset  . Breast cancer Mother   . Hypertension Mother   . Hyperlipidemia Mother   . Diabetes Mother   . Prostate cancer Father   . Colon polyps Father   . Hyperlipidemia Father   . Crohn's disease Paternal Aunt   . Diabetes Maternal Grandmother   . Diabetes Brother     x 3  . Heart disease Brother     x 3  . Hyperlipidemia Brother     x 3  . Alcohol abuse Daughter   . Drug abuse Daughter   . Asthma Brother   . Colon cancer Neg Hx     Social History:  reports that he has never smoked. He has never used smokeless tobacco. He reports that he drinks alcohol. He reports that he does not use drugs.  Allergies:  Allergies  Allergen Reactions  . Morphine Other (See Comments)    REACTION: severe headache  . Other Anaphylaxis and Hives    Pecan  . Peanut-Containing Drug Products Anaphylaxis and Hives    Swelling of throat  . Oxycodone-Acetaminophen Other (See Comments)    REACTION: headache  . Penicillins Rash    Has patient had a PCN reaction causing immediate rash, facial/tongue/throat swelling, SOB or lightheadedness with hypotension: Yes Has patient had a PCN reaction causing severe rash involving mucus membranes or skin necrosis: No Has patient had a PCN reaction that required hospitalization No Has patient had a PCN reaction occurring within the last 10 years: No If all of the above answers are "NO", then may proceed with Cephalosporin use.   REACTION: red, flushed  . Promethazine Hcl Other (See Comments)    REACTION: makes him feel drunk    Medications: I have reviewed the patient's current medications.  Results for orders placed or performed during the hospital encounter of 05/13/16 (from the past 48 hour(s))  Respiratory Panel by PCR     Status: None   Collection Time:  05/17/16 11:39 AM  Result Value Ref Range   Adenovirus NOT DETECTED NOT DETECTED   Coronavirus 229E NOT DETECTED NOT DETECTED   Coronavirus HKU1 NOT DETECTED NOT DETECTED   Coronavirus NL63 NOT DETECTED NOT DETECTED   Coronavirus OC43 NOT DETECTED NOT DETECTED   Metapneumovirus NOT DETECTED NOT DETECTED   Rhinovirus / Enterovirus NOT DETECTED NOT DETECTED   Influenza A NOT DETECTED NOT DETECTED   Influenza B NOT DETECTED NOT DETECTED   Parainfluenza Virus 1 NOT DETECTED NOT DETECTED   Parainfluenza Virus 2 NOT DETECTED NOT DETECTED   Parainfluenza Virus 3 NOT DETECTED NOT DETECTED   Parainfluenza Virus 4 NOT DETECTED NOT  DETECTED   Respiratory Syncytial Virus NOT DETECTED NOT DETECTED   Bordetella pertussis NOT DETECTED NOT DETECTED   Chlamydophila pneumoniae NOT DETECTED NOT DETECTED   Mycoplasma pneumoniae NOT DETECTED NOT DETECTED  CBC     Status: Abnormal   Collection Time: 05/18/16  4:02 AM  Result Value Ref Range   WBC 10.4 4.0 - 10.5 K/uL   RBC 3.90 (L) 4.22 - 5.81 MIL/uL   Hemoglobin 11.0 (L) 13.0 - 17.0 g/dL   HCT 33.0 (L) 39.0 - 52.0 %   MCV 84.6 78.0 - 100.0 fL   MCH 28.2 26.0 - 34.0 pg   MCHC 33.3 30.0 - 36.0 g/dL   RDW 15.8 (H) 11.5 - 15.5 %   Platelets 150 150 - 400 K/uL  Basic metabolic panel     Status: Abnormal   Collection Time: 05/18/16  4:02 AM  Result Value Ref Range   Sodium 131 (L) 135 - 145 mmol/L   Potassium 3.8 3.5 - 5.1 mmol/L   Chloride 96 (L) 101 - 111 mmol/L   CO2 28 22 - 32 mmol/L   Glucose, Bld 96 65 - 99 mg/dL   BUN 9 6 - 20 mg/dL   Creatinine, Ser 0.81 0.61 - 1.24 mg/dL   Calcium 8.0 (L) 8.9 - 10.3 mg/dL   GFR calc non Af Amer >60 >60 mL/min   GFR calc Af Amer >60 >60 mL/min    Comment: (NOTE) The eGFR has been calculated using the CKD EPI equation. This calculation has not been validated in all clinical situations. eGFR's persistently <60 mL/min signify possible Chronic Kidney Disease.    Anion gap 7 5 - 15  CBC     Status:  Abnormal   Collection Time: 05/19/16  8:17 AM  Result Value Ref Range   WBC 9.2 4.0 - 10.5 K/uL   RBC 4.48 4.22 - 5.81 MIL/uL   Hemoglobin 12.5 (L) 13.0 - 17.0 g/dL   HCT 37.6 (L) 39.0 - 52.0 %   MCV 83.9 78.0 - 100.0 fL   MCH 27.9 26.0 - 34.0 pg   MCHC 33.2 30.0 - 36.0 g/dL   RDW 15.5 11.5 - 15.5 %   Platelets 185 150 - 400 K/uL  Basic metabolic panel     Status: Abnormal   Collection Time: 05/19/16  8:17 AM  Result Value Ref Range   Sodium 132 (L) 135 - 145 mmol/L   Potassium 3.4 (L) 3.5 - 5.1 mmol/L   Chloride 95 (L) 101 - 111 mmol/L   CO2 29 22 - 32 mmol/L   Glucose, Bld 115 (H) 65 - 99 mg/dL   BUN 6 6 - 20 mg/dL   Creatinine, Ser 0.86 0.61 - 1.24 mg/dL   Calcium 8.3 (L) 8.9 - 10.3 mg/dL   GFR calc non Af Amer >60 >60 mL/min   GFR calc Af Amer >60 >60 mL/min    Comment: (NOTE) The eGFR has been calculated using the CKD EPI equation. This calculation has not been validated in all clinical situations. eGFR's persistently <60 mL/min signify possible Chronic Kidney Disease.    Anion gap 8 5 - 15    Dg Abd Portable 2v  Result Date: 05/17/2016 CLINICAL DATA:  66 year old male with abdominal and vomiting. Chronic severe abdominal pain. Patient uses narcotics daily. EXAM: PORTABLE ABDOMEN - 2 VIEW COMPARISON:  Prior abdominal radiograph 05/15/2016 FINDINGS: Incompletely imaged cardiomegaly. Patient is status post median sternotomy with evidence of prior multivessel CABG. Low inspiratory volumes with nonspecific patchy right  lower lobe airspace opacity which could reflect atelectasis or infiltrate. No evidence of free air on the up right images. Unchanged bowel gas pattern. Persisting gas within a minimally dilated loops small bowel in the right abdomen. Advanced multilevel degenerative disc disease with levoconvex scoliosis and surgical changes of prior posterior lumbar interbody fusion. IMPRESSION: 1. Unchanged bowel gas pattern with gas in multiple loops of minimally dilated small  bowel in the right mid abdomen. Ileus versus partial small bowel obstruction. 2. Low inspiratory volumes with nonspecific right basilar patchy airspace opacity which may reflect atelectasis or infiltrate. Electronically Signed   By: Jacqulynn Cadet M.D.   On: 05/17/2016 11:03    Review of Systems  Musculoskeletal: Positive for back pain, joint pain and myalgias.   Blood pressure (!) 143/72, pulse 84, temperature 98.4 F (36.9 C), temperature source Oral, resp. rate 18, height _0  (1.651 m), weight 55.1 kg (121 lb 7.6 oz), SpO2 93 %. Physical Exam  Constitutional: He is oriented to person, place, and time.  Neurological: He is alert and oriented to person, place, and time. He has normal strength. GCS eye subscore is 4. GCS verbal subscore is 5. GCS motor subscore is 6.  Patient complains of low back pain localizes around the upper lumbar lower thoracic spine with some extension lower lumbar. Strength 5 out of 5 iliopsoas, quads, and she's, gastric, and tibialis, and EHL.    Assessment/Plan: 66 year old woman with back pain seems to be intractable does have some evidence of segmental degeneration above his previous fusion the only solution for this if his pain doesn't come under control with conservative treatment would be to extend his fusion up to the T10 contrast stabilize across the segment and adding into his lower fusion construct. Relatively big operation certainly patient would be needed to and up medically before we undertook that procedure. Certainly I would want him have complete full course of antibiotics for his pneumonia and have the abdominal issues resolved. There is nothing overtly emergent with the minimal change in his follow-up imaging with regard to his canal stenosis. However due to his severe pain and inability to mobilize its possible the patient may have to stay in the hospital during his treatment before we can have the opportunity to stabilize and should be cleared  medically. I would recommend bracing in the short term and I will order a TLSO brace.  Kashauna Celmer P 05/19/2016, 10:27 AM

## 2016-05-20 ENCOUNTER — Inpatient Hospital Stay (HOSPITAL_COMMUNITY): Payer: Medicare Other

## 2016-05-20 LAB — BASIC METABOLIC PANEL
Anion gap: 7 (ref 5–15)
BUN: 6 mg/dL (ref 6–20)
CO2: 28 mmol/L (ref 22–32)
Calcium: 8.6 mg/dL — ABNORMAL LOW (ref 8.9–10.3)
Chloride: 97 mmol/L — ABNORMAL LOW (ref 101–111)
Creatinine, Ser: 0.8 mg/dL (ref 0.61–1.24)
GFR calc Af Amer: >60 mL/min (ref >60)
GFR calc non Af Amer: >60 mL/min (ref >60)
Glucose, Bld: 117 mg/dL — ABNORMAL HIGH (ref 65–99)
Potassium: 3.9 mmol/L (ref 3.5–5.1)
Sodium: 132 mmol/L — ABNORMAL LOW (ref 135–145)

## 2016-05-20 LAB — CBC
HCT: 37.5 % — ABNORMAL LOW (ref 39.0–52.0)
Hemoglobin: 12.4 g/dL — ABNORMAL LOW (ref 13.0–17.0)
MCH: 28 pg (ref 26.0–34.0)
MCHC: 33.1 g/dL (ref 30.0–36.0)
MCV: 84.7 fL (ref 78.0–100.0)
Platelets: 190 10*3/uL (ref 150–400)
RBC: 4.43 MIL/uL (ref 4.22–5.81)
RDW: 15.5 % (ref 11.5–15.5)
WBC: 9.8 10*3/uL (ref 4.0–10.5)

## 2016-05-20 MED ORDER — SULFAMETHOXAZOLE-TRIMETHOPRIM 800-160 MG PO TABS
1.0000 | ORAL_TABLET | Freq: Two times a day (BID) | ORAL | Status: DC
  Administered 2016-05-20 – 2016-05-24 (×8): 1 via ORAL
  Filled 2016-05-20 (×8): qty 1

## 2016-05-20 NOTE — Progress Notes (Signed)
Physical Therapy Treatment Patient Details Name: [name] MRN: [id number] DOB: Aug 18, 1950 Today's Date: 05/20/2016    History of Present Illness 66 y.o. male admitted to [facility] on 05/13/16 for acute worsening of his chronic low back pain.  Pt dx with acute on chronic low back pain, neurosurgery does not feel surgical intervention is appropriate.  Pt also found to have cellulitis L LE, PNA, and abdominal distention (ileum vs partial SBO).  Pt with significant PMHx of tubular adenoma of the colon, TIA, dilation of esophageal narrowing, RA, oil TKA, HTN, MI, neuropathy, hepatitis A, HIV, gout, fibromyalgia, DVT L LE (chronic), CAD, CLL, chronic back pain (s/p multiple spine surgeries), bil shoulder surgery, and CABG.    PT Comments    Pt was unable to do more than sit EOB with me today.  He became nauseated and started to vomit.  He reports he just had IV pain meds and was willing to try, but once seated and he vomited he did not want to proceed.  RN made aware and offering nausea meds.  PT will check in in AM so that we can have both AM and PM opportunities to get a session in when he feels his best.   Follow Up Recommendations  Home health PT;Supervision - Intermittent     Equipment Recommendations  None recommended by PT    Recommendations for Other Services   NA     Precautions / Restrictions Precautions Precautions: Fall    Mobility  Bed Mobility Overal bed mobility: Needs Assistance Bed Mobility: Sidelying to Sit;Sit to Sidelying   Sidelying to sit: Modified independent (Device/Increase time);HOB elevated     Sit to sidelying: Min assist General bed mobility comments: Pt already side lying when PT entered room.  Donned orthopedic shoes in bed and pt pushed up to sitting.  Pt requires extra time, but can get to sitting from sidelying without assist.  Once sitting he became hot, nasueated and started to vomit.  "I don't think now is a good time" I helped position him back in side  lying (he was unable to lift his legs back into bed on his own and positioned him with a pillow between his knees.    Transfers                 General transfer comment: unable to attempt due to N/V EOB.  RN made aware and nausea meds given         Balance Overall balance assessment: Needs assistance Sitting-balance support: Feet supported;Bilateral upper extremity supported Sitting balance-[name]: Fair                              Cognition Arousal/Alertness: Lethargic;Suspect due to medications Behavior During Therapy: WFL for tasks assessed/performed Overall Cognitive Status: Within Functional Limits for tasks assessed                      Exercises      General Comments General comments (skin integrity, edema, etc.): O2 checked with mobility on RA, O2 sats 91%.  O2 [place] re-applied at end of session.       Pertinent Vitals/Pain Pain Assessment: Faces Faces Pain Scale: Hurts even more Pain Location: low back Pain Descriptors / Indicators: Grimacing;Guarding Pain Intervention(s): Limited activity within patient's tolerance;Monitored during session;Repositioned           PT Goals (current goals can now be found in the care  plan section) Acute Rehab PT Goals Patient Stated Goal: to decrease pain and maintain or improve his mobility Progress towards PT goals: Not progressing toward goals - comment (limited by nausea today)    Frequency    Min 3X/week      PT Plan Current plan remains appropriate       End of Session   Activity Tolerance: Patient limited by pain;Other (comment) (limited by N/V) Patient left: in bed;with call bell/phone within reach     Time: [id number] PT Time Calculation (min) (ACUTE ONLY): 23 min  Charges:  $Therapeutic Activity: 8-22 mins                      Monita Swier B. Nyelli Samara, PT, DPT [phone]   05/20/2016, 4:09 PM

## 2016-05-20 NOTE — Progress Notes (Signed)
Patient ID: [name], male   DOB: Jul 28, 1950, 66 y.o.   MRN: [id number]    PROGRESS NOTE    Christopher TEEPLES  [id number] DOB: 07/19/1950 DOA: 05/13/2016  PCP: [name], MD   Brief Narrative:   66 y.o. male with medical history significant for HIV, CLL, CAD status post CABG, rheumatoid arthritis, and degenerative disc disease with chronic low back pain status post posterior fusion, presenting to the emergency department with acute worsening of his chronic low back pain. Patient reports that he was in his usual state of health until 05/09/2016 when he experienced insidious worsening in his chronic low back pain. There was no precipitating event that he could recall, no fall or trauma, and no heavy lifting or increased exertion.  Assessment & Plan:   1. Acute on chronic mid and low-back pain, intractable  - Pt with chronic LBP s/p fusion  - There was no precipitating event identified; no fevers or wt-loss reported; no incontinence or saddle paraesthesia  - CT L-spine with stable chronic changes; no mass, infection, or significant canal stenosis noted  - MRI L-spine with no specific acute finding to explain the cause  - MRI thoracic spine also pending at this time  - ? Need for surgical intervention, if needed, pt ok from internal medicine standpoint   2. Cellulitis, LLE  - Mild non-purulent cellulitis with penicillin allergy, started on empiric clindamycin and will continue same  - Underlying DVT excluded with venous dopplers, notable for left inguinal adenopathy  - completed therapy with clindamycin  3. HIV  - CD4 was 1,140 and VL 31 in October 2017  - Continue current management with Prezista and Genvoya    4. Hx of recurrent DVT  - Continue Savaysa  - There is LLE edema, possibly related to post-thrombotic syndrome or pelvic and retroperitoneal adenopathy  - US performed and negative for DVT    5. CLL  - Pt continues to follow with oncology, not currently on any  treatments  - CT abd/pelvis demonstrates a mild increase in retroperitoneal and pelvic adenopathy  - outpatient follow up  6. CAD - No anginal complaints  - Continue ASA, Lipitor, Toprol    7. Hypertension, essential  - reasonable inpatient control   8. Rheumatoid arthritis  - Continue prednisone 5 mg daily   9. Incidental lung nodule  - 5 mm LLL nodule noted on CT   10. LLL PNA - Zithromax added 1/3, changed to Levaquin  - today is the last day of Levaquin - will repeat CXR today to ensure improvement   11. Abd distension - ileus vs pSBO - placed on bowel regimen, had BM this AM and yesterday - advanced diet to soft  - appears to be resolved at this point - surgery team has signed off   12. Hyponatremia - appears to be pre renal  - improving - BMP In AM  13. Right testicular swelling - testicular US with evidence of a degree of epididymitis on the right, several small cysts noted in the right epididymis. No intratesticular mass or inflammatory focus. No testicular torsion. Evidence of prior inflammation in the left scrotal sac with calcification along the left scrotal sac. - There is again noted a fairly large hydrocele on the right. Mild debris in hydrocele on the right is probably due to the epididymitis - I have discussed this with urologist Dr. Tresa Moore, recommended increasing the frequency of Bactrim, pt is currently on Bactrim QD for prophylaxis (due to  HIV) - will increase the dose to BID as recommended and pt needs to continue this for total 14 days - also will ask for ice pack to the affected area and scrotal support, this will likely take several weeks to resolved but no intervention indicated at this time unless pt becomes hemodynamically unstable   14. Thrombocytopenia - chronic  - overall stable with no signs of active bleeding  - Plt WNL this AM  15. Obesity  - Body mass index is 33.49 kg/m  16. Hypokalemia - supplemented and WNL this AM - BMP in  AM  DVT prophylaxis: Edoxaban Code Status: Full  Family Communication: Patient at bedside  Disposition Plan: Home once back pain improves, per neurosurgery team, looks like surgery will have to be done prior to discharge, will continue to follow up on recommendations.   Consultants:   Neurosurgery over the phone  Surgery   Dr. Tresa Moore urologist, over the phone   Procedures:   None  Antimicrobials:   Clindamycin 1/2 --> 1/8  Zithromax 1/3 --> 1/8  Levaquin 1/8 --> 1/9  Bactrim at home QD, increase the frequency to BID 1/9 --> needs 14 days therapy   Subjective: Persistent back pain and mid and lower area, 7/10 in severity.   Objective: Vitals:   05/19/16 0513 05/19/16 2019 05/20/16 0535 05/20/16 1300  BP: (!) 143/72 130/69 124/65 121/79  Pulse: 84 80 75 79  Resp: 18 18 18 18   Temp: 98.4 F (36.9 C) 97.4 F (36.3 C) 97 F (36.1 C) 98 F (36.7 C)  TempSrc: Oral Axillary Oral Oral  SpO2: 93% 95% 95% 98%  Weight: 55.1 kg (121 lb 7.6 oz)     Height:        Intake/Output Summary (Last 24 hours) at 05/20/16 1544 Last data filed at 05/20/16 1300  Gross per 24 hour  Intake              400 ml  Output             1275 ml  Net             -875 ml   Filed Weights   05/13/16 2300 05/19/16 0513  Weight: 91.3 kg (201 lb 4.5 oz) 55.1 kg (121 lb 7.6 oz)    Examination:  General exam: Appears to be in pain this AM Respiratory system: Clear to auscultation. Respiratory effort normal. Mild rhonchi at bases. Cardiovascular system: S1 & S2 heard, RRR. No JVD, murmurs, rubs, gallops or clicks. No pedal edema. Gastrointestinal system: Abdomen is non diistended, soft and nontender. No organomegaly or masses felt.  Central nervous system: Alert and oriented.   Data Reviewed: I have personally reviewed following labs and imaging studies  CBC:  Recent Labs Lab 05/16/16 0813 05/17/16 0701 05/18/16 0402 05/19/16 0817 05/20/16 0836  WBC 7.6 10.3 10.4 9.2 9.8  HGB 11.4*  11.9* 11.0* 12.5* 12.4*  HCT 34.9* 36.3* 33.0* 37.6* 37.5*  MCV 85.1 84.6 84.6 83.9 84.7  PLT 118* 144* 150 185 [id number]   Basic Metabolic Panel:  Recent Labs Lab 05/16/16 0813 05/17/16 0701 05/18/16 0402 05/19/16 0817 05/20/16 0836  NA 134* 131* 131* 132* 132*  K 3.4* 3.9 3.8 3.4* 3.9  CL 100* 98* 96* 95* 97*  CO2 26 25 28 29 28   GLUCOSE 129* 89 96 115* 117*  BUN 7 7 9 6 6   CREATININE 0.82 0.70 0.81 0.86 0.80  CALCIUM 8.1* 8.2* 8.0* 8.3* 8.6*   Liver Function Tests:  No results for input(s): AST, ALT, ALKPHOS, BILITOT, PROT, ALBUMIN in the last 168 hours. No results for input(s): LIPASE, AMYLASE in the last 168 hours. CBG:  Recent Labs Lab 05/14/16 0653 05/14/16 0710 05/15/16 0626 05/16/16 0633  GLUCAP 64* 76 87 101*   Urine analysis:    Component Value Date/Time   COLORURINE AMBER (A) 05/13/2016 1754   APPEARANCEUR CLEAR 05/13/2016 1754   LABSPEC >1.046 (H) 05/13/2016 1754   PHURINE 5.0 05/13/2016 1754   GLUCOSEU NEGATIVE 05/13/2016 1754   HGBUR NEGATIVE 05/13/2016 1754   BILIRUBINUR NEGATIVE 05/13/2016 1754   KETONESUR 20 (A) 05/13/2016 1754   PROTEINUR 30 (A) 05/13/2016 1754   UROBILINOGEN 1.0 01/17/2015 1218   NITRITE NEGATIVE 05/13/2016 1754   LEUKOCYTESUR NEGATIVE 05/13/2016 1754   Radiology Studies: Mr Thoracic Spine Wo Contrast  Result Date: 05/20/2016 CLINICAL DATA:  Long-standing history of chronic low back pain he's been fused from L2 down to the sacrum. Severe back pain radiating from both hips to the legs. EXAM: MRI THORACIC SPINE WITHOUT CONTRAST TECHNIQUE: Multiplanar, multisequence MR imaging of the thoracic spine was performed. No intravenous contrast was administered. COMPARISON:  CT chest 05/19/2013, CT chest 05/11/2015, CT abdomen/pelvis 05/13/2016, MR lumbar spine 05/14/2016 FINDINGS: Alignment: No static listhesis. Dextrocurvature of the thoracic spine. Kyphosis centered at L1-2. Vertebrae: No fracture, evidence of discitis, or bone lesion. Cord:   Normal signal and morphology. Paraspinal and other soft tissues: Retroperitoneal lymphadenopathy is partially visualized. There is thickening of the right diaphragmatic crux and mild edema with low signal areas within the thickened diaphragmatic crux. Disc levels: Disc spaces: Degenerative disc disease with disc height loss at C6-7. Mild degenerative disc disease at T11-12. Disc spaces are relatively well maintained otherwise. T1-T2: No significant disc protrusion, foraminal stenosis or central canal stenosis. T2-T3: No significant disc protrusion, foraminal stenosis or central canal stenosis. T3-T4: No significant disc protrusion, foraminal stenosis or central canal stenosis. T4-T5: No significant disc protrusion, foraminal stenosis or central canal stenosis. T5-T6: No significant disc protrusion, foraminal stenosis or central canal stenosis. T6-T7: No significant disc protrusion, foraminal stenosis or central canal stenosis. T7-T8: No significant disc protrusion, foraminal stenosis or central canal stenosis. T8-T9: No significant disc protrusion, foraminal stenosis or central canal stenosis. T9-T10: No significant disc protrusion, foraminal stenosis or central canal stenosis. T10-T11: No significant disc protrusion, foraminal stenosis or central canal stenosis. T11-T12: No significant disc protrusion, foraminal stenosis or central canal stenosis. L1-2 is partially visualized on the sagittal images and was better evaluated on recent MRI of the lumbar spine dated 05/15/2015. There are extensive degenerative changes at L1-2 with fluid in the disc space. Given the lack of surrounding marrow edema and disc fusion above and below this level, this likely reflects chronic advanced changes secondary to abnormal motion at this level. IMPRESSION: 1. No acute osseous injury of the thoracic spine. 2. No thoracic spine disc protrusion, foraminal stenosis or central canal stenosis. 3. L1-2 is partially visualized on the  sagittal images and was better evaluated on recent MRI of the lumbar spine dated 05/15/2015. 4. Partially visualized is retroperitoneal lymphadenopathy. 5. There is thickening of the right diaphragmatic crux and mild edema with low signal areas within the thickened diaphragmatic crux. This may be secondary to an infectious or inflammatory process. Correlate with laboratory values. If the patient is able to receive intravenous contrast, recommend MRI of the thoracic spine with intravenous contrast. Electronically Signed   By: Kathreen Devoid   On: 05/20/2016 11:14    Scheduled  Meds: . aspirin EC  81 mg Oral Daily  . atorvastatin  10 mg Oral QHS  . Darunavir Ethanolate  800 mg Oral Q breakfast  . edoxaban  30 mg Oral Daily  . elvitegravir-cobicistat-emtricitabine-tenofovir  1 tablet Oral Q breakfast  . erythromycin   Both Eyes Q6H  . escitalopram  10 mg Oral Daily  . guaiFENesin  600 mg Oral BID  . levofloxacin  750 mg Oral Daily  . loratadine  10 mg Oral Daily  . metoprolol succinate  25 mg Oral Daily  . pantoprazole  40 mg Oral BID AC  . predniSONE  5 mg Oral Q breakfast  . pregabalin  200 mg Oral BID  . saccharomyces boulardii  250 mg Oral BID  . senna-docusate  1 tablet Oral BID  . sodium chloride flush  3 mL Intravenous Q12H  . sulfamethoxazole-trimethoprim  1 tablet Oral Q12H  . valACYclovir  500 mg Oral Daily   Continuous Infusions:   LOS: 6 days   Time spent: 20 minutes   Faye Ramsay, MD [facility] Pager [phone]  If 7PM-7AM, please contact night-coverage [email or web address] Password [facility] 05/20/2016, 3:44 PM

## 2016-05-21 ENCOUNTER — Inpatient Hospital Stay (HOSPITAL_COMMUNITY): Payer: Medicare Other

## 2016-05-21 LAB — BASIC METABOLIC PANEL
Anion gap: 7 (ref 5–15)
BUN: 8 mg/dL (ref 6–20)
CO2: 29 mmol/L (ref 22–32)
Calcium: 8.8 mg/dL — ABNORMAL LOW (ref 8.9–10.3)
Chloride: 96 mmol/L — ABNORMAL LOW (ref 101–111)
Creatinine, Ser: 0.82 mg/dL (ref 0.61–1.24)
GFR calc Af Amer: >60 mL/min (ref >60)
GFR calc non Af Amer: >60 mL/min (ref >60)
Glucose, Bld: 91 mg/dL (ref 65–99)
Potassium: 4.2 mmol/L (ref 3.5–5.1)
Sodium: 132 mmol/L — ABNORMAL LOW (ref 135–145)

## 2016-05-21 LAB — GLUCOSE, CAPILLARY: Glucose-Capillary: 89 mg/dL (ref 65–99)

## 2016-05-21 LAB — CBC
HCT: 37.1 % — ABNORMAL LOW (ref 39.0–52.0)
Hemoglobin: 12 g/dL — ABNORMAL LOW (ref 13.0–17.0)
MCH: 28 pg (ref 26.0–34.0)
MCHC: 32.3 g/dL (ref 30.0–36.0)
MCV: 86.5 fL (ref 78.0–100.0)
Platelets: 206 10*3/uL (ref 150–400)
RBC: 4.29 MIL/uL (ref 4.22–5.81)
RDW: 15.8 % — ABNORMAL HIGH (ref 11.5–15.5)
WBC: 9.5 10*3/uL (ref 4.0–10.5)

## 2016-05-21 NOTE — Progress Notes (Signed)
PROGRESS NOTE    [name]  [id number] DOB: Apr 07, 1951 DOA: 05/13/2016 PCP: [name], MD   Brief Narrative:  66 y.o.malewith medical history significant for HIV, CLL, CAD status post CABG, rheumatoid arthritis, and degenerative disc disease with chronic low back pain status post posterior fusion, presenting to the emergency department with acute worsening of his chronic low back pain. Patient reports that he was in his usual state of health until 05/09/2016 when he experienced insidious worsening in his chronic low back pain. There was no precipitating event that he could recall, no fall or trauma, and no heavy lifting or increased exertion.   Assessment & Plan:   Principal Problem:   Intractable pain Active Problems:   CAD (coronary artery disease) of artery bypass graft   HIV disease (HCC)   Rheumatoid arthritis ([place])   Chronic lymphoblastic leukemia (HCC)   Hypertension   DVT, lower extremity, recurrent (HCC)   Depression   Leg weakness, bilateral   Chronic low back pain with sciatica   Intractable back pain   Cellulitis of leg, left   Incidental lung nodule, > 11mm and < 12mm   1. Acute on chronic mid and low-back pain, intractable  - Pt with chronic LBP s/p fusion  - There was no precipitating event identified; no fevers or wt-loss reported; no incontinence or saddle paraesthesia  - CT L-spine with stable chronic changes; no mass, infection, or significant canal stenosis noted  - MRI L-spine with no specific acute finding to explain the cause  - MRI thoracic spine - done, defer decision to neurosx - Need for surgical intervention, pt ok from internal medicine standpoint. He is a moderate risk patient given his comorbidity.   2. Cellulitis, LLE - resolved  - completed therapy with clindamycin  3. HIV  - CD4 was 1,140 and VL 31 in October 2017  - Continue current management with Prezista and Genvoya   4. Hx of recurrent DVT  - Continue Savaysa  -  There is LLE edema, possibly related to post-thrombotic syndrome or pelvic and retroperitoneal adenopathy  - US performed and negative for DVT   5. CLL  - Pt continues to follow with oncology, not currently on any treatments  - CT abd/pelvis demonstrates a mild increase in retroperitoneal and pelvic adenopathy  - outpatient follow up  6. CAD - No anginal complaints  - Continue ASA, Lipitor, Toprol   7. Hypertension, essential  - reasonable inpatient control   8. Rheumatoid arthritis  - Continue prednisone 5 mg daily   9. Incidental lung nodule  - 5 mm LLL nodule noted on CT   10. LLL PNA - Zithromax added 1/3, changed to Levaquin  - today is the last day of Levaquin - will repeat CXR today to ensure improvement   11. Abd distension - ileus vs pSBO - on bowel regimen  - advanced diet to soft  - appears to be resolved at this point - surgery team has signed off   12. Hyponatremia - stable  - appears to be pre renal  - cont gentle hydration.    13. Right testicular swelling - testicular US with evidence of a degree of epididymitis on the right, several small cysts noted in the right epididymis. No intratesticular mass or inflammatory focus. No testicular torsion. Evidence of prior inflammation in the left scrotal sac with calcification along the left scrotal sac. - There is again noted a fairly large hydrocele on the right. Mild debris in hydrocele  on the right is probably due to the epididymitis - Case was discussed with Dr Tresa Moore by Dr Doyle Askew yesterday who recommended increasing the frequency of Bactrim, pt is currently on Bactrim QD for prophylaxis (due to HIV) - increased the dose to BID as recommended and to continue this for total 14 days - also will ask for ice pack to the affected area and scrotal support, this will likely take several weeks to resolved but no intervention indicated at this time unless pt becomes hemodynamically unstable   14.  Thrombocytopenia - chronic  - overall stable with no signs of active bleeding  - Plt WNL this AM  15. Obesity  - Body mass index is 33.49 kg/m  16. Hypokalemia - supplemented and WNL this AM - BMP in AM  DVT prophylaxis: Edoxaban Code Status: Full  Family Communication: Patient at bedside  Disposition Plan: Home once back pain improves, per neurosurgery team, looks like surgery will have to be done prior to discharge, will continue to follow up on recommendations. Pending today.   Consultants:   Neurosurgery, Dr Saintclair Halsted who knows the patient well from the previous sx   Surgery   Dr. Tresa Moore Urologist, over the phone   Procedures:   None  Antimicrobials:   Clindamycin 1/2 --> 1/8  Zithromax 1/3 --> 1/8  Levaquin 1/8 --> 1/9  Bactrim at home QD, increase the frequency to BID 1/9 --> needs 14 days therapy     Subjective: Patient doesn't have any complaints today. He states his scrotum is feeling better. He remains afebrile. Doesn't have any resp complaints. Only thing that continues to bother him is his back.   Objective: Vitals:   05/20/16 1607 05/20/16 2029 05/21/16 0500 05/21/16 0831  BP:  116/66 114/62 114/62  Pulse:  66 60 60  Resp:  18 18   Temp:  97.8 F (36.6 C) 97.8 F (36.6 C)   TempSrc:  Oral Oral   SpO2: 91% 92% 92%   Weight:      Height:        Intake/Output Summary (Last 24 hours) at 05/21/16 1332 Last data filed at 05/21/16 0853  Gross per 24 hour  Intake              363 ml  Output             1050 ml  Net             -687 ml   Filed Weights   05/13/16 2300 05/19/16 0513  Weight: 91.3 kg (201 lb 4.5 oz) 55.1 kg (121 lb 7.6 oz)    Examination:  General exam: Appears calm and comfortable  Respiratory system: Clear to auscultation. Respiratory effort normal. Cardiovascular system: S1 & S2 heard, RRR. No JVD, murmurs, rubs, gallops or clicks. No pedal edema. Gastrointestinal system: Abdomen is nondistended, soft and nontender. No  organomegaly or masses felt. Normal bowel sounds heard. Central nervous system: Alert and oriented. No focal neurological deficits. Extremities: Symmetric 5 x 5 power. Skin: No rashes, lesions or ulcers Psychiatry: Judgement and insight appear normal. Mood & affect appropriate.     Data Reviewed:   CBC:  Recent Labs Lab 05/17/16 0701 05/18/16 0402 05/19/16 0817 05/20/16 0836 05/21/16 0531  WBC 10.3 10.4 9.2 9.8 9.5  HGB 11.9* 11.0* 12.5* 12.4* 12.0*  HCT 36.3* 33.0* 37.6* 37.5* 37.1*  MCV 84.6 84.6 83.9 84.7 86.5  PLT 144* 150 185 190 [id number]   Basic Metabolic Panel:  Recent Labs Lab  05/17/16 0701 05/18/16 0402 05/19/16 0817 05/20/16 0836 05/21/16 0531  NA 131* 131* 132* 132* 132*  K 3.9 3.8 3.4* 3.9 4.2  CL 98* 96* 95* 97* 96*  CO2 25 28 29 28 29   GLUCOSE 89 96 115* 117* 91  BUN 7 9 6 6 8   CREATININE 0.70 0.81 0.86 0.80 0.82  CALCIUM 8.2* 8.0* 8.3* 8.6* 8.8*   GFR: Estimated Creatinine Clearance: 67.2 mL/min (by C-G formula based on SCr of 0.82 mg/dL). Liver Function Tests: No results for input(s): AST, ALT, ALKPHOS, BILITOT, PROT, ALBUMIN in the last 168 hours. No results for input(s): LIPASE, AMYLASE in the last 168 hours. No results for input(s): AMMONIA in the last 168 hours. Coagulation Profile: No results for input(s): INR, PROTIME in the last 168 hours. Cardiac Enzymes:  Recent Labs Lab 05/14/16 2101 05/15/16 0248 05/15/16 0831  TROPONINI <0.03 <0.03 <0.03   BNP (last 3 results) No results for input(s): PROBNP in the last 8760 hours. HbA1C: No results for input(s): HGBA1C in the last 72 hours. CBG:  Recent Labs Lab 05/15/16 0626 05/16/16 0633 05/21/16 0809  GLUCAP 87 101* 89   Lipid Profile: No results for input(s): CHOL, HDL, LDLCALC, TRIG, CHOLHDL, LDLDIRECT in the last 72 hours. Thyroid Function Tests: No results for input(s): TSH, T4TOTAL, FREET4, T3FREE, THYROIDAB in the last 72 hours. Anemia Panel: No results for input(s):  VITAMINB12, FOLATE, FERRITIN, TIBC, IRON, RETICCTPCT in the last 72 hours. Sepsis Labs: No results for input(s): PROCALCITON, LATICACIDVEN in the last 168 hours.  Recent Results (from the past 240 hour(s))  Respiratory Panel by PCR     Status: None   Collection Time: 05/17/16 11:39 AM  Result Value Ref Range Status   Adenovirus NOT DETECTED NOT DETECTED Final   Coronavirus 229E NOT DETECTED NOT DETECTED Final   Coronavirus HKU1 NOT DETECTED NOT DETECTED Final   Coronavirus NL63 NOT DETECTED NOT DETECTED Final   Coronavirus OC43 NOT DETECTED NOT DETECTED Final   Metapneumovirus NOT DETECTED NOT DETECTED Final   Rhinovirus / Enterovirus NOT DETECTED NOT DETECTED Final   Influenza A NOT DETECTED NOT DETECTED Final   Influenza B NOT DETECTED NOT DETECTED Final   Parainfluenza Virus 1 NOT DETECTED NOT DETECTED Final   Parainfluenza Virus 2 NOT DETECTED NOT DETECTED Final   Parainfluenza Virus 3 NOT DETECTED NOT DETECTED Final   Parainfluenza Virus 4 NOT DETECTED NOT DETECTED Final   Respiratory Syncytial Virus NOT DETECTED NOT DETECTED Final   Bordetella pertussis NOT DETECTED NOT DETECTED Final   Chlamydophila pneumoniae NOT DETECTED NOT DETECTED Final   Mycoplasma pneumoniae NOT DETECTED NOT DETECTED Final         Radiology Studies: Mr Thoracic Spine Wo Contrast  Result Date: 05/20/2016 CLINICAL DATA:  Long-standing history of chronic low back pain he's been fused from L2 down to the sacrum. Severe back pain radiating from both hips to the legs. EXAM: MRI THORACIC SPINE WITHOUT CONTRAST TECHNIQUE: Multiplanar, multisequence MR imaging of the thoracic spine was performed. No intravenous contrast was administered. COMPARISON:  CT chest 05/19/2013, CT chest 05/11/2015, CT abdomen/pelvis 05/13/2016, MR lumbar spine 05/14/2016 FINDINGS: Alignment: No static listhesis. Dextrocurvature of the thoracic spine. Kyphosis centered at L1-2. Vertebrae: No fracture, evidence of discitis, or bone  lesion. Cord:  Normal signal and morphology. Paraspinal and other soft tissues: Retroperitoneal lymphadenopathy is partially visualized. There is thickening of the right diaphragmatic crux and mild edema with low signal areas within the thickened diaphragmatic crux. Disc levels: Disc spaces:  Degenerative disc disease with disc height loss at C6-7. Mild degenerative disc disease at T11-12. Disc spaces are relatively well maintained otherwise. T1-T2: No significant disc protrusion, foraminal stenosis or central canal stenosis. T2-T3: No significant disc protrusion, foraminal stenosis or central canal stenosis. T3-T4: No significant disc protrusion, foraminal stenosis or central canal stenosis. T4-T5: No significant disc protrusion, foraminal stenosis or central canal stenosis. T5-T6: No significant disc protrusion, foraminal stenosis or central canal stenosis. T6-T7: No significant disc protrusion, foraminal stenosis or central canal stenosis. T7-T8: No significant disc protrusion, foraminal stenosis or central canal stenosis. T8-T9: No significant disc protrusion, foraminal stenosis or central canal stenosis. T9-T10: No significant disc protrusion, foraminal stenosis or central canal stenosis. T10-T11: No significant disc protrusion, foraminal stenosis or central canal stenosis. T11-T12: No significant disc protrusion, foraminal stenosis or central canal stenosis. L1-2 is partially visualized on the sagittal images and was better evaluated on recent MRI of the lumbar spine dated 05/15/2015. There are extensive degenerative changes at L1-2 with fluid in the disc space. Given the lack of surrounding marrow edema and disc fusion above and below this level, this likely reflects chronic advanced changes secondary to abnormal motion at this level. IMPRESSION: 1. No acute osseous injury of the thoracic spine. 2. No thoracic spine disc protrusion, foraminal stenosis or central canal stenosis. 3. L1-2 is partially visualized  on the sagittal images and was better evaluated on recent MRI of the lumbar spine dated 05/15/2015. 4. Partially visualized is retroperitoneal lymphadenopathy. 5. There is thickening of the right diaphragmatic crux and mild edema with low signal areas within the thickened diaphragmatic crux. This may be secondary to an infectious or inflammatory process. Correlate with laboratory values. If the patient is able to receive intravenous contrast, recommend MRI of the thoracic spine with intravenous contrast. Electronically Signed   By: Kathreen Devoid   On: 05/20/2016 11:14   Dg Chest Port 1 View  Result Date: 05/20/2016 CLINICAL DATA:  Preop spine surgery.  Follow-up pneumonia. EXAM: PORTABLE CHEST 1 VIEW COMPARISON:  05/14/2016 FINDINGS: The patient is rotated to the right which limits assessment of the mediastinum. Sequelae of prior CABG are again identified. There is mild left basilar opacity, improved from the prior study. There is no evidence of new airspace consolidation, sizeable pleural effusion, or pneumothorax. A calcified granuloma is again seen in the right upper lobe No acute osseous abnormality is seen. IMPRESSION: Mild residual left basilar infiltrate, improved from prior. Electronically Signed   By: Logan Bores M.D.   On: 05/20/2016 16:10        Scheduled Meds: . aspirin EC  81 mg Oral Daily  . atorvastatin  10 mg Oral QHS  . Darunavir Ethanolate  800 mg Oral Q breakfast  . edoxaban  30 mg Oral Daily  . elvitegravir-cobicistat-emtricitabine-tenofovir  1 tablet Oral Q breakfast  . erythromycin   Both Eyes Q6H  . escitalopram  10 mg Oral Daily  . guaiFENesin  600 mg Oral BID  . loratadine  10 mg Oral Daily  . metoprolol succinate  25 mg Oral Daily  . pantoprazole  40 mg Oral BID AC  . predniSONE  5 mg Oral Q breakfast  . pregabalin  200 mg Oral BID  . saccharomyces boulardii  250 mg Oral BID  . senna-docusate  1 tablet Oral BID  . sodium chloride flush  3 mL Intravenous Q12H  .  sulfamethoxazole-trimethoprim  1 tablet Oral Q12H  . valACYclovir  500 mg Oral Daily  Continuous Infusions:   LOS: 7 days    Time spent: 35 mins     Hiilei Gerst Arsenio Loader, MD [facility] Pager [phone]   If 7PM-7AM, please contact night-coverage [email or web address] Password [email or web address] 05/21/2016, 1:32 PM

## 2016-05-21 NOTE — Progress Notes (Signed)
Physical Therapy Treatment Patient Details Name: [name] MRN: [id number] DOB: 1950-06-23 Today's Date: 05/21/2016    History of Present Illness 66 y.o. male admitted to [facility] on 05/13/16 for acute worsening of his chronic low back pain.  Pt dx with acute on chronic low back pain, neurosurgery does not feel surgical intervention is appropriate.  Pt also found to have cellulitis L LE, PNA, and abdominal distention (ileum vs partial SBO).  Pt with significant PMHx of tubular adenoma of the colon, TIA, dilation of esophageal narrowing, RA, oil TKA, HTN, MI, neuropathy, hepatitis A, HIV, gout, fibromyalgia, DVT L LE (chronic), CAD, CLL, chronic back pain (s/p multiple spine surgeries), bil shoulder surgery, and CABG.    PT Comments    Pt was able and agreeable to walk a short distance into the hallway with me today. He was, however, unable to stand back up from the bed after walking to use the urinal.  His pain was too great after very minimal exertion.  He will very likely need SNF placement at discharge if he has surgery on his spine.  PT will continue to follow acutely.   Follow Up Recommendations  SNF     Equipment Recommendations  None recommended by PT    Recommendations for Other Services   NA     Precautions / Restrictions Precautions Precautions: Fall    Mobility  Bed Mobility Overal bed mobility: Needs Assistance     Sidelying to sit: Modified independent (Device/Increase time);HOB elevated     Sit to sidelying: Min assist General bed mobility comments: Mod I to get to sitting EOB with heavy reliance on bil hands for support when getting to sitting EOB.  Assist needed to lift legs back into bed when returning to side lying.    Transfers Overall transfer level: Needs assistance Equipment used: 4-wheeled walker Transfers: Sit to/from Stand Sit to Stand: Min assist         General transfer comment: Min assist to help support trunk to power up to standing.  Attempted  donning brace EOB, however, the pressure of the brace on his back was too painful for him to tolerate.   Ambulation/Gait Ambulation/Gait assistance: Min guard Ambulation Distance (Feet): 80 Feet Assistive device: 4-wheeled walker Gait Pattern/deviations: Step-through pattern;Shuffle;Trunk flexed Gait velocity: decreased Gait velocity interpretation: Below normal speed for age/gender General Gait Details: Pt is essentially bent over at a 90 degree angle at his trunk and leaning on RW for support during gait.  Per daughter and pt this is his baseline and as high as he can stand and still be able to move.            Balance Overall balance assessment: Needs assistance Sitting-balance support: Feet supported;Bilateral upper extremity supported Sitting balance-[name]: Fair Sitting balance - Comments: pt unable to put his shoes on unassisted   Standing balance support: Bilateral upper extremity supported Standing balance-[name]: Poor                      Cognition Arousal/Alertness: Awake/alert Behavior During Therapy: WFL for tasks assessed/performed Overall Cognitive Status: Within Functional Limits for tasks assessed                         General Comments General comments (skin integrity, edema, etc.): Pt attempted to stand from bed again to use urinal after we walked and he was unable to get to his feet even assisted due to pain.  He told me that surgeon says he will have surgery maybe even later this week and he is expecting that he will need SNF level rehab at discharge.  I agree this will be likely.  O2 sats on RA after gait were 87%, so 2 L O2 [place] were returned to his nose.       Pertinent Vitals/Pain Pain Assessment: Faces Faces Pain Scale: Hurts whole lot Pain Location: left low back and left sided hip Pain Descriptors / Indicators: Grimacing;Guarding Pain Intervention(s): Limited activity within patient's tolerance;Monitored during  session;Repositioned;Patient requesting pain meds-RN notified           PT Goals (current goals can now be found in the care plan section) Acute Rehab PT Goals Patient Stated Goal: to decrease pain and maintain or improve his mobility Progress towards PT goals: Progressing toward goals    Frequency    Min 3X/week      PT Plan Discharge plan needs to be updated       End of Session   Activity Tolerance: Patient limited by pain Patient left: in bed;with call bell/phone within reach;with family/visitor present     Time: [id number] PT Time Calculation (min) (ACUTE ONLY): 24 min  Charges:  $Gait Training: 8-22 mins $Therapeutic Activity: 8-22 mins                      Namine Beahm B. [facility] [place], [place], DPT [phone]   05/21/2016, 6:11 PM

## 2016-05-22 LAB — GLUCOSE, CAPILLARY: Glucose-Capillary: 91 mg/dL (ref 65–99)

## 2016-05-22 NOTE — Progress Notes (Signed)
PROGRESS NOTE    [name]  [id number] DOB: January 19, 1951 DOA: 05/13/2016 PCP: [name], MD   Brief Narrative:  66 y.o.malewith medical history significant for HIV, CLL, CAD status post CABG, rheumatoid arthritis, and degenerative disc disease with chronic low back pain status post posterior fusion, presenting to the emergency department with acute worsening of his chronic low back pain. Patient reports that he was in his usual state of health until 05/09/2016 when he experienced insidious worsening in his chronic low back pain. There was no precipitating event that he could recall, no fall or trauma, and no heavy lifting or increased exertion.     Assessment & Plan:   Principal Problem:   Intractable pain Active Problems:   CAD (coronary artery disease) of artery bypass graft   HIV disease (HCC)   Rheumatoid arthritis (HCC)   Chronic lymphoblastic leukemia (HCC)   Hypertension   DVT, lower extremity, recurrent (HCC)   Depression   Leg weakness, bilateral   Chronic low back pain with sciatica   Intractable back pain   Cellulitis of leg, left   Incidental lung nodule, > 75mm and < 57mm   Acute on chronic mid and low-back pain, intractable  - Pt with chronic LBP s/p fusion  - There was no precipitating event identified; no fevers or wt-loss reported; no incontinence or saddle paraesthesia  - CT L-spine with stable chronic changes; no mass, infection, or significant canal stenosis noted  - MRI L-spine with no specific acute finding to explain the cause  - MRI thoracic spine - done, defer decision to neurosx - Need for surgical intervention, pt ok from internal medicine standpoint. He is a moderate risk patient given his comorbidity- per Dr. Saintclair Halsted, plan to place lumbar corset and watch pain over next several days  Hip hematoma -d/c anticoagulation -place SCDs  Cellulitis, LLE - resolved  - completed therapy with clindamycin  HIV  - CD4 was 1,140 and VL 31 in  October 2017  - Continue current management with Prezista and Genvoya   Hx of recurrent DVT  - d/c Savaysa due to hip hematoma and possible surgery - There is LLE edema, possibly related to post-thrombotic syndrome or pelvic and retroperitoneal adenopathy  - US performed and negative for DVT  -SCDs  CLL  - Pt continues to follow with oncology, not currently on any treatments  - CT abd/pelvis demonstrates a mild increase in retroperitoneal and pelvic adenopathy  - outpatient follow up  CAD - No anginal complaints  - Continue ASA, Lipitor, Toprol   Hypertension, essential  - reasonable inpatient control   Rheumatoid arthritis  - Continue prednisone 5 mg daily   Incidental lung nodule  - 5 mm LLL nodule noted on CT    LLL PNA - Zithromax added 1/3, changed to Levaquin and course completed -  repeat CXR shows improvement  Abd distension - ileus vs pSBO - on bowel regimen  -resolved--- tolerating food   Hyponatremia - stable    Right testicular swelling - testicular US with evidence of a degree of epididymitis on the right, several small cysts noted in the right epididymis. No intratesticular mass or inflammatory focus. No testicular torsion. Evidence of prior inflammation in the left scrotal sac with calcification along the left scrotal sac. - There is again noted a fairly large hydrocele on the right. Mild debris in hydrocele on the right is probably due to the epididymitis - Case was discussed with Dr Tresa Moore by Dr Doyle Askew  who recommended increasing the frequency of Bactrim, pt is currently on Bactrim QD for prophylaxis (due to HIV) - increased the dose to BID as recommended and to continue this for total 14 days - also will ask for ice pack to the affected area and scrotal support, this will likely take several weeks to resolved but no intervention indicated at this time unless pt becomes hemodynamically unstable   Thrombocytopenia - chronic    Obesity     Hypokalemia - BMP in AM  DVT prophylaxis: scd Code Status: Full  Family Communication: Patient at bedside  Disposition Plan: d/c plan dependant on pain    Consultants:   Neurosurgery, Dr Saintclair Halsted who knows the patient well from the previous sx   Surgery   Dr. Tresa Moore Urologist, over the phone     Antimicrobials:   Clindamycin 1/2 --> 1/8  Zithromax 1/3 --> 1/8  Levaquin 1/8 --> 1/9  Bactrim at home QD, increase the frequency to BID 1/9 --> needs 14 days therapy     Subjective: Swelling on hip improved today after ice  Objective: Vitals:   05/21/16 0831 05/21/16 1400 05/21/16 2056 05/22/16 0440  BP: 114/62 118/66 124/71 120/65  Pulse: 60 62 75 70  Resp:  20 20 20   Temp:  97.5 F (36.4 C) 98.3 F (36.8 C) 98 F (36.7 C)  TempSrc:  Oral Oral Oral  SpO2:  93% 98% 98%  Weight:      Height:        Intake/Output Summary (Last 24 hours) at 05/22/16 0835 Last data filed at 05/21/16 2210  Gross per 24 hour  Intake              883 ml  Output             1250 ml  Net             -367 ml   Filed Weights   05/13/16 2300 05/19/16 0513  Weight: 91.3 kg (201 lb 4.5 oz) 55.1 kg (121 lb 7.6 oz)    Examination:  General exam: Appears calm and comfortable  Respiratory system: Clear to auscultation. Respiratory effort normal. Cardiovascular system: S1 & S2 heard, RRR. No JVD, murmurs, rubs, gallops or clicks. No pedal edema. Gastrointestinal system: Abdomen is nondistended, soft and nontender. No organomegaly or masses felt. Normal bowel sounds heard. Central nervous system: Alert and oriented. No focal neurological deficits. Extremities: Symmetric 5 x 5 power. Skin: decreasing size lump on hip    Data Reviewed:   CBC:  Recent Labs Lab 05/17/16 0701 05/18/16 0402 05/19/16 0817 05/20/16 0836 05/21/16 0531  WBC 10.3 10.4 9.2 9.8 9.5  HGB 11.9* 11.0* 12.5* 12.4* 12.0*  HCT 36.3* 33.0* 37.6* 37.5* 37.1*  MCV 84.6 84.6 83.9 84.7 86.5  PLT 144* 150 185 190  [id number]   Basic Metabolic Panel:  Recent Labs Lab 05/17/16 0701 05/18/16 0402 05/19/16 0817 05/20/16 0836 05/21/16 0531  NA 131* 131* 132* 132* 132*  K 3.9 3.8 3.4* 3.9 4.2  CL 98* 96* 95* 97* 96*  CO2 25 28 29 28 29   GLUCOSE 89 96 115* 117* 91  BUN 7 9 6 6 8   CREATININE 0.70 0.81 0.86 0.80 0.82  CALCIUM 8.2* 8.0* 8.3* 8.6* 8.8*   GFR: Estimated Creatinine Clearance: 67.2 mL/min (by C-G formula based on SCr of 0.82 mg/dL). Liver Function Tests: No results for input(s): AST, ALT, ALKPHOS, BILITOT, PROT, ALBUMIN in the last 168 hours. No results for input(s): LIPASE, AMYLASE  in the last 168 hours. No results for input(s): AMMONIA in the last 168 hours. Coagulation Profile: No results for input(s): INR, PROTIME in the last 168 hours. Cardiac Enzymes: No results for input(s): CKTOTAL, CKMB, CKMBINDEX, TROPONINI in the last 168 hours. BNP (last 3 results) No results for input(s): PROBNP in the last 8760 hours. HbA1C: No results for input(s): HGBA1C in the last 72 hours. CBG:  Recent Labs Lab 05/16/16 0633 05/21/16 0809 05/22/16 0645  GLUCAP 101* 89 91   Lipid Profile: No results for input(s): CHOL, HDL, LDLCALC, TRIG, CHOLHDL, LDLDIRECT in the last 72 hours. Thyroid Function Tests: No results for input(s): TSH, T4TOTAL, FREET4, T3FREE, THYROIDAB in the last 72 hours. Anemia Panel: No results for input(s): VITAMINB12, FOLATE, FERRITIN, TIBC, IRON, RETICCTPCT in the last 72 hours. Sepsis Labs: No results for input(s): PROCALCITON, LATICACIDVEN in the last 168 hours.  Recent Results (from the past 240 hour(s))  Respiratory Panel by PCR     Status: None   Collection Time: 05/17/16 11:39 AM  Result Value Ref Range Status   Adenovirus NOT DETECTED NOT DETECTED Final   Coronavirus 229E NOT DETECTED NOT DETECTED Final   Coronavirus HKU1 NOT DETECTED NOT DETECTED Final   Coronavirus NL63 NOT DETECTED NOT DETECTED Final   Coronavirus OC43 NOT DETECTED NOT DETECTED Final    Metapneumovirus NOT DETECTED NOT DETECTED Final   Rhinovirus / Enterovirus NOT DETECTED NOT DETECTED Final   Influenza A NOT DETECTED NOT DETECTED Final   Influenza B NOT DETECTED NOT DETECTED Final   Parainfluenza Virus 1 NOT DETECTED NOT DETECTED Final   Parainfluenza Virus 2 NOT DETECTED NOT DETECTED Final   Parainfluenza Virus 3 NOT DETECTED NOT DETECTED Final   Parainfluenza Virus 4 NOT DETECTED NOT DETECTED Final   Respiratory Syncytial Virus NOT DETECTED NOT DETECTED Final   Bordetella pertussis NOT DETECTED NOT DETECTED Final   Chlamydophila pneumoniae NOT DETECTED NOT DETECTED Final   Mycoplasma pneumoniae NOT DETECTED NOT DETECTED Final         Radiology Studies: Ct Pelvis Wo Contrast  Result Date: 05/21/2016 CLINICAL DATA:  Pain with a bump on the left lateral hip starting yesterday. Bump is increasing in size. No known injury. EXAM: CT PELVIS WITHOUT CONTRAST TECHNIQUE: Multidetector CT imaging of the pelvis was performed following the standard protocol without intravenous contrast. COMPARISON:  05/13/2016 FINDINGS: Urinary Tract: No bladder wall thickening or filling defect. Distal ureters are decompressed. Bowel: Visualized portions of the small bowel and colon are decompressed. No evidence of obstruction or inflammation. Scattered diverticula in the sigmoid colon. Vascular/Lymphatic: Mild calcification in the abdominal aorta and iliac vessels. Prominent retroperitoneal and pelvic lymphadenopathy. This is consistent with patient history of HIV and chronic lymphocytic leukemia. Similar appearance to prior study. Reproductive: Prostate gland is not enlarged. Small scrotal hydrocele is noted. Other:  No free air or free fluid in the pelvis. Musculoskeletal: There is a hematoma posterior and lateral to the inter trochanteric region of the left hip. The hematoma measures about 5.3 x 9.5 x 9.1 cm. This finding is new since previous study. No evidence of acute fracture or dislocation in  the pelvis, sacrum, or hips. Degenerative changes in the lower lumbar spine and in the hips. Postoperative change in the lower lumbar spine. IMPRESSION: New development of a hematoma adjacent to the left hip. No evidence of acute fracture or dislocation. Extensive lymphadenopathy in the retroperitoneum and pelvis is again demonstrated without significant change. A small scrotal hydrocele is noted. Electronically Signed  By: Lucienne Capers M.D.   On: 05/21/2016 21:56   Mr Thoracic Spine Wo Contrast  Result Date: 05/20/2016 CLINICAL DATA:  Long-standing history of chronic low back pain he's been fused from L2 down to the sacrum. Severe back pain radiating from both hips to the legs. EXAM: MRI THORACIC SPINE WITHOUT CONTRAST TECHNIQUE: Multiplanar, multisequence MR imaging of the thoracic spine was performed. No intravenous contrast was administered. COMPARISON:  CT chest 05/19/2013, CT chest 05/11/2015, CT abdomen/pelvis 05/13/2016, MR lumbar spine 05/14/2016 FINDINGS: Alignment: No static listhesis. Dextrocurvature of the thoracic spine. Kyphosis centered at L1-2. Vertebrae: No fracture, evidence of discitis, or bone lesion. Cord:  Normal signal and morphology. Paraspinal and other soft tissues: Retroperitoneal lymphadenopathy is partially visualized. There is thickening of the right diaphragmatic crux and mild edema with low signal areas within the thickened diaphragmatic crux. Disc levels: Disc spaces: Degenerative disc disease with disc height loss at C6-7. Mild degenerative disc disease at T11-12. Disc spaces are relatively well maintained otherwise. T1-T2: No significant disc protrusion, foraminal stenosis or central canal stenosis. T2-T3: No significant disc protrusion, foraminal stenosis or central canal stenosis. T3-T4: No significant disc protrusion, foraminal stenosis or central canal stenosis. T4-T5: No significant disc protrusion, foraminal stenosis or central canal stenosis. T5-T6: No significant  disc protrusion, foraminal stenosis or central canal stenosis. T6-T7: No significant disc protrusion, foraminal stenosis or central canal stenosis. T7-T8: No significant disc protrusion, foraminal stenosis or central canal stenosis. T8-T9: No significant disc protrusion, foraminal stenosis or central canal stenosis. T9-T10: No significant disc protrusion, foraminal stenosis or central canal stenosis. T10-T11: No significant disc protrusion, foraminal stenosis or central canal stenosis. T11-T12: No significant disc protrusion, foraminal stenosis or central canal stenosis. L1-2 is partially visualized on the sagittal images and was better evaluated on recent MRI of the lumbar spine dated 05/15/2015. There are extensive degenerative changes at L1-2 with fluid in the disc space. Given the lack of surrounding marrow edema and disc fusion above and below this level, this likely reflects chronic advanced changes secondary to abnormal motion at this level. IMPRESSION: 1. No acute osseous injury of the thoracic spine. 2. No thoracic spine disc protrusion, foraminal stenosis or central canal stenosis. 3. L1-2 is partially visualized on the sagittal images and was better evaluated on recent MRI of the lumbar spine dated 05/15/2015. 4. Partially visualized is retroperitoneal lymphadenopathy. 5. There is thickening of the right diaphragmatic crux and mild edema with low signal areas within the thickened diaphragmatic crux. This may be secondary to an infectious or inflammatory process. Correlate with laboratory values. If the patient is able to receive intravenous contrast, recommend MRI of the thoracic spine with intravenous contrast. Electronically Signed   By: Kathreen Devoid   On: 05/20/2016 11:14   Dg Chest Port 1 View  Result Date: 05/20/2016 CLINICAL DATA:  Preop spine surgery.  Follow-up pneumonia. EXAM: PORTABLE CHEST 1 VIEW COMPARISON:  05/14/2016 FINDINGS: The patient is rotated to the right which limits assessment of  the mediastinum. Sequelae of prior CABG are again identified. There is mild left basilar opacity, improved from the prior study. There is no evidence of new airspace consolidation, sizeable pleural effusion, or pneumothorax. A calcified granuloma is again seen in the right upper lobe No acute osseous abnormality is seen. IMPRESSION: Mild residual left basilar infiltrate, improved from prior. Electronically Signed   By: Logan Bores M.D.   On: 05/20/2016 16:10        Scheduled Meds: . aspirin EC  81  mg Oral Daily  . atorvastatin  10 mg Oral QHS  . Darunavir Ethanolate  800 mg Oral Q breakfast  . elvitegravir-cobicistat-emtricitabine-tenofovir  1 tablet Oral Q breakfast  . erythromycin   Both Eyes Q6H  . escitalopram  10 mg Oral Daily  . guaiFENesin  600 mg Oral BID  . loratadine  10 mg Oral Daily  . metoprolol succinate  25 mg Oral Daily  . pantoprazole  40 mg Oral BID AC  . predniSONE  5 mg Oral Q breakfast  . pregabalin  200 mg Oral BID  . saccharomyces boulardii  250 mg Oral BID  . senna-docusate  1 tablet Oral BID  . sodium chloride flush  3 mL Intravenous Q12H  . sulfamethoxazole-trimethoprim  1 tablet Oral Q12H  . valACYclovir  500 mg Oral Daily   Continuous Infusions:   LOS: 8 days    Time spent: 25 mins     [place], DO [facility] Pager [phone]  If 7PM-7AM, please contact night-coverage [email or web address] Password [facility] 05/22/2016, 8:35 AM

## 2016-05-22 NOTE — NC FL2 (Signed)
[place] LEVEL OF CARE SCREENING TOOL     IDENTIFICATION  Patient Name: [name] Birthdate: 1951/02/23 Sex: male Admission Date (Current Location): 05/13/2016  [facility] and [place] Number:  Herbalist and Address:  [facility], [place] [address], [place], [place] [address]      Provider Number: [id number]  Attending Physician Name and Address:  Geradine Girt, DO  Relative Name and Phone Number:       Current Level of Care: Hospital Recommended Level of Care: [place] Prior Approval Number:    Date Approved/Denied: 03/27/09 PASRR Number:  [id number]   Discharge Plan: SNF    Current Diagnoses: Patient Active Problem List   Diagnosis Date Noted  . Incidental lung nodule, > 38mm and < 52mm 05/14/2016  . Chronic low back pain with sciatica 05/13/2016  . Intractable pain 05/13/2016  . Intractable back pain 05/13/2016  . Cellulitis of leg, left 05/13/2016  . Leg weakness, bilateral 03/27/2016  . Gait instability 03/27/2016  . Cataracta 03/27/2016  . ILD (interstitial lung disease) ([place]) 09/13/2015  . Immunocompromised state ([place]) 09/13/2015  . Tinea cruris 09/12/2015  . Pressure ulcer 07/11/2015  . PCP (pneumocystis jiroveci pneumonia) ([place]) 06/18/2015  . Postinflammatory pulmonary fibrosis ([place]) 05/11/2015  . Secondary syphilis of skin 07/24/2014  . Insomnia 11/11/2013  . GERD (gastroesophageal reflux disease)   . Hereditary and idiopathic peripheral neuropathy 09/08/2013  . Erosive esophagitis 12/28/2012  . Depression 11/16/2012  . Allergic rhinitis, cause unspecified 04/28/2012  . Long term current use of anticoagulant therapy 02/03/2012  . Hypertension   . DVT, lower extremity, recurrent ([place])   . Osteoarthritis, knee   . Gout   . Chronic back pain   . Gynecomastia, male 11/19/2011  . Carotid artery occlusion   . Hyperlipidemia with target LDL less than 100   . HIV disease ([place]) 04/08/2011  .  Rheumatoid arthritis ([place]) 04/08/2011  . Chronic lymphoblastic leukemia ([place]) 04/08/2011  . Impotence of organic origin 04/02/2011  . CAD (coronary artery disease) of artery bypass graft 02/14/2009    Orientation RESPIRATION BLADDER Height & Weight     Self, Time, Situation, Place  O2 (Nasal Cannula; 2L) Continent Weight: 121 lb 7.6 oz (55.1 kg) Height:  5\' 5"  (165.1 cm)  BEHAVIORAL SYMPTOMS/MOOD NEUROLOGICAL BOWEL NUTRITION STATUS      Continent  (Please see discharge summary)  AMBULATORY STATUS COMMUNICATION OF NEEDS Skin   Limited Assist Verbally Normal                       Personal Care Assistance Level of Assistance  Bathing, Feeding, Dressing Bathing Assistance: Limited assistance Feeding assistance: Independent Dressing Assistance: Limited assistance     Functional Limitations Info  Sight, Hearing, Speech Sight Info: Adequate Hearing Info: Adequate Speech Info: Adequate    SPECIAL CARE FACTORS FREQUENCY  PT (By licensed PT), OT (By licensed OT)     PT Frequency: min 3x week OT Frequency:  min 3x week            Contractures Contractures Info: Not present    Additional Factors Info  Code Status, Allergies Code Status Info: Full Allergies Info: Morphine, Other, Peanut-containing Drug Products, Oxycodone-acetaminophen, Penicillins, Promethazine Hcl           Current Medications (05/22/2016):  This is the current hospital active medication list Current Facility-Administered Medications  Medication Dose Route Frequency Provider Last Rate Last Dose  . 0.9 %  sodium chloride infusion  250 mL Intravenous PRN Vianne Bulls, MD      . acetaminophen (TYLENOL) tablet 650 mg  650 mg Oral Q6H PRN Vianne Bulls, MD       Or  . acetaminophen (TYLENOL) suppository 650 mg  650 mg Rectal Q6H PRN Vianne Bulls, MD      . albuterol (PROVENTIL) (2.5 MG/3ML) 0.083% nebulizer solution 2.5 mg  2.5 mg Nebulization Q4H PRN Gardiner Barefoot, NP      . alum & mag  hydroxide-simeth (MAALOX/MYLANTA) 200-200-20 MG/5ML suspension 30 mL  30 mL Oral Q4H PRN Vianne Bulls, MD   30 mL at 05/14/16 0627  . aspirin EC tablet 81 mg  81 mg Oral Daily Ilene Qua Opyd, MD   81 mg at 05/22/16 1010  . atorvastatin (LIPITOR) tablet 10 mg  10 mg Oral QHS Vianne Bulls, MD   10 mg at 05/21/16 2309  . bisacodyl (DULCOLAX) EC tablet 5 mg  5 mg Oral Daily PRN Vianne Bulls, MD   5 mg at 05/14/16 1135  . Darunavir Ethanolate (PREZISTA) tablet 800 mg  800 mg Oral Q breakfast Ilene Qua Opyd, MD   800 mg at 05/22/16 1013  . elvitegravir-cobicistat-emtricitabine-tenofovir (GENVOYA) 150-150-200-10 MG tablet 1 tablet  1 tablet Oral Q breakfast Vianne Bulls, MD   1 tablet at 05/22/16 1013  . erythromycin ophthalmic ointment   Both Eyes Q6H Theodis Blaze, MD      . escitalopram (LEXAPRO) tablet 10 mg  10 mg Oral Daily Vianne Bulls, MD   10 mg at 05/22/16 1012  . guaiFENesin (MUCINEX) 12 hr tablet 600 mg  600 mg Oral BID Gardiner Barefoot, NP   600 mg at 05/22/16 1010  . HYDROmorphone (DILAUDID) injection 0.5 mg  0.5 mg Intravenous Q4H PRN Theodis Blaze, MD   0.5 mg at 05/20/16 1319  . HYDROmorphone (DILAUDID) tablet 2 mg  2 mg Oral Q3H PRN Theodis Blaze, MD   2 mg at 05/21/16 2308  . loratadine (CLARITIN) tablet 10 mg  10 mg Oral Daily Vianne Bulls, MD   10 mg at 05/22/16 1011  . metoprolol succinate (TOPROL-XL) 24 hr tablet 25 mg  25 mg Oral Daily Vianne Bulls, MD   25 mg at 05/22/16 1011  . nitroGLYCERIN (NITROSTAT) SL tablet 0.4 mg  0.4 mg Sublingual Q5 min PRN Gardiner Barefoot, NP   0.4 mg at 05/14/16 2055  . ondansetron (ZOFRAN) tablet 4 mg  4 mg Oral Q6H PRN Vianne Bulls, MD       Or  . ondansetron (ZOFRAN) injection 4 mg  4 mg Intravenous Q6H PRN Vianne Bulls, MD   4 mg at 05/20/16 1609  . pantoprazole (PROTONIX) EC tablet 40 mg  40 mg Oral BID AC Ilene Qua Opyd, MD   40 mg at 05/22/16 1011  . polyethylene glycol (MIRALAX / GLYCOLAX) packet 17 g  17 g Oral Daily  PRN Vianne Bulls, MD   17 g at 05/15/16 0946  . polyvinyl alcohol (LIQUIFILM TEARS) 1.4 % ophthalmic solution 1 drop  1 drop Both Eyes PRN Theodis Blaze, MD   1 drop at 05/14/16 1129  . predniSONE (DELTASONE) tablet 5 mg  5 mg Oral Q breakfast Vianne Bulls, MD   5 mg at 05/22/16 1011  . pregabalin (LYRICA) capsule 200 mg  200 mg Oral BID Vianne Bulls, MD   200  mg at 05/22/16 1011  . saccharomyces boulardii (FLORASTOR) capsule 250 mg  250 mg Oral BID Vianne Bulls, MD   250 mg at 05/22/16 1011  . senna-docusate (Senokot-S) tablet 1 tablet  1 tablet Oral BID Theodis Blaze, MD   1 tablet at 05/22/16 1011  . sodium chloride flush (NS) 0.9 % injection 3 mL  3 mL Intravenous Q12H Ilene Qua Opyd, MD   3 mL at 05/21/16 1000  . sodium chloride flush (NS) 0.9 % injection 3 mL  3 mL Intravenous PRN Vianne Bulls, MD      . sulfamethoxazole-trimethoprim (BACTRIM DS,SEPTRA DS) 800-160 MG per tablet 1 tablet  1 tablet Oral Q12H Theodis Blaze, MD   1 tablet at 05/22/16 1012  . valACYclovir (VALTREX) tablet 500 mg  500 mg Oral Daily Ilene Qua Opyd, MD   500 mg at 05/22/16 1012  . zolpidem (AMBIEN) tablet 5 mg  5 mg Oral QHS PRN Vianne Bulls, MD         Discharge Medications: Please see discharge summary for a list of discharge medications.  Relevant Imaging Results:  Relevant Lab Results:   Additional Information SSN: [id number]   Ht: 5\' 5"  (1.651 m)  Wt: 121 lb 7.6 oz (55.1 kg)     Juanjose Mojica A [place], LCSW

## 2016-05-22 NOTE — Clinical Social Work Note (Signed)
Clinical Social Work Assessment  Patient Details  Name: [name] MRN: [id number] Date of Birth: Jun 24, 1950  Date of referral:  05/22/16               Reason for consult:  Facility Placement                Permission sought to share information with:  Family Supports Permission granted to share information::  Yes, Verbal Permission Granted  Name::     [name]  Agency::     Relationship::  Daughter  Contact Information:  [phone]  Housing/Transportation Living arrangements for the past 2 months:  [place] of Information:  Patient Patient Interpreter Needed:  None Criminal Activity/Legal Involvement Pertinent to Current Situation/Hospitalization:  No - Comment as needed Significant Relationships:  Adult Children Lives with:  Self Do you feel safe going back to the place where you live?  Yes Need for family participation in patient care:     Care giving concerns:  Pt's daughter at bedside during initial assessment. Pt provided CSW with verbal permission to contact daughter if needed.   Social Worker assessment / plan:  CSW spoke with pt at bedside to complete initial assessment. Pt lives home alone. Pt lives in [place], [place] minutes from [place]. Pt came to [facility] for surgery because this is where all of his MD's are. Pt has been to a facility in [place] in the past and has a very bad experience. Per pt they were holding his meds for 4 hours. Per pt and pt's daughter, pt's daughter had to get her lawyer to help get her dad out of the facility. Pt is agreeable to SNF after d/c. Pt reports he is going to have another surgery if he does not do well with PT and regain strength. Pt reports he wants the facility to know he does not want a roommate who is homophobic or is weirded out by HIV. Pt reports he would like to go to a facility in [place]. CSW will send pt's referal to [facility] facilities and follow up with pt.   Employment status:  Retired Radiation protection practitioner:  Medicare PT Recommendations:  [place] / Referral to community resources:  [place]  Patient/Family's Response to care:  Pt verbalized understanding of CSW role and expressed great appreciation for support. Pt denies any concern regarding pt care at this time.   Patient/Family's Understanding of and Emotional Response to Diagnosis, Current Treatment, and Prognosis:  Pt understanding and is realistic regarding physical limitations. Pt understands the need for SNF placement before pending surgery. Pt reports he will go to a facility "that doesn't try to kill him." Pt does not have a good history with SNF placement in [facility]. Pt denies any concern regarding pt treatment plan at this time.   Emotional Assessment Appearance:  Appears stated age Attitude/Demeanor/Rapport:   (Patient was appropriate/) Affect (typically observed):  Accepting, Appropriate, Calm Orientation:  Oriented to Self, Oriented to Place, Oriented to  Time, Oriented to Situation Alcohol / Substance use:  Not Applicable Psych involvement (Current and /or in the community):  No (Comment)  Discharge Needs  Concerns to be addressed:  No discharge needs identified Readmission within the last 30 days:  No Current discharge risk:  Dependent with Mobility Barriers to Discharge:  Continued Medical Work up   [employer], LCSW 05/22/2016, 2:59 PM

## 2016-05-22 NOTE — Progress Notes (Signed)
Physical Therapy Treatment Patient Details Name: [name] MRN: [id number] DOB: 1950/07/18 Today's Date: 05/22/2016    History of Present Illness 66 y.o. male admitted to [facility] on 05/13/16 for acute worsening of his chronic low back pain.  Pt dx with acute on chronic low back pain, neurosurgery does not feel surgical intervention is appropriate.  Pt also found to have cellulitis L LE, PNA, and abdominal distention (ileum vs partial SBO).  Pt with significant PMHx of tubular adenoma of the colon, TIA, dilation of esophageal narrowing, RA, oil TKA, HTN, MI, neuropathy, hepatitis A, HIV, gout, fibromyalgia, DVT L LE (chronic), CAD, CLL, chronic back pain (s/p multiple spine surgeries), bil shoulder surgery, and CABG.    PT Comments    Pt presents with improved tolerance for mobility this session. Performed gait with rollator and lumbar brace in place with improved gait distance noted this session. Pt is able to perform short distance gait with upright posture in room with brace in place. Pt is making improvements toward goals.    Follow Up Recommendations  SNF     Equipment Recommendations  None recommended by PT    Recommendations for Other Services       Precautions / Restrictions Precautions Precautions: Fall Required Braces or Orthoses: Spinal Brace Spinal Brace: Applied in sitting position;Lumbar corset Restrictions Weight Bearing Restrictions: No    Mobility  Bed Mobility Overal bed mobility: Needs Assistance Bed Mobility: Sidelying to Sit;Sit to Sidelying Rolling: Modified independent (Device/Increase time) Sidelying to sit: Modified independent (Device/Increase time);HOB elevated     Sit to sidelying: Modified independent (Device/Increase time) General bed mobility comments: Mod I to get to sitting EOB with heavy reliance on bil hands for support when getting to sitting EOB.  Able to get Sit to sidelying without assistance.   Transfers Overall transfer level: Needs  assistance Equipment used: 4-wheeled walker Transfers: Sit to/from Stand Sit to Stand: Min guard         General transfer comment: Min guard for safety from EOB  Ambulation/Gait Ambulation/Gait assistance: Min guard Ambulation Distance (Feet): 200 Feet Assistive device: 4-wheeled walker Gait Pattern/deviations: Step-through pattern;Shuffle;Trunk flexed Gait velocity: decreased Gait velocity interpretation: Below normal speed for age/gender General Gait Details: Pt continues to ambulate with flexed trunk. Able to perform gait x 10' with upright posture with brace in place   Stairs            Wheelchair Mobility    Modified Rankin (Stroke Patients Only)       Balance                                    Cognition Arousal/Alertness: Awake/alert Behavior During Therapy: WFL for tasks assessed/performed Overall Cognitive Status: Within Functional Limits for tasks assessed                      Exercises      General Comments        Pertinent Vitals/Pain Pain Assessment: 0-10 Pain Score: 6  Pain Location: left low back and left sided hip Pain Descriptors / Indicators: Grimacing;Guarding Pain Intervention(s): Monitored during session;Patient requesting pain meds-RN notified;Premedicated before session    Home Living                      Prior Function            PT Goals (current goals can  now be found in the care plan section) Acute Rehab PT Goals Patient Stated Goal: to decrease pain and maintain or improve his mobility Progress towards PT goals: Progressing toward goals    Frequency    Min 3X/week      PT Plan Current plan remains appropriate    Co-evaluation             End of Session Equipment Utilized During Treatment: Gait belt Activity Tolerance: Patient limited by pain Patient left: in bed;with call bell/phone within reach;with nursing/sitter in room     Time: 1500-1523 PT Time Calculation  (min) (ACUTE ONLY): 23 min  Charges:  $Gait Training: 23-37 mins                    G Codes:      Scheryl Marten PT, DPT  [phone]  05/22/2016, 3:28 PM

## 2016-05-22 NOTE — Progress Notes (Signed)
Orthopedic Tech Progress Note Patient Details:  [name] Jul 29, 1950 [id number]  Patient ID: [name], male   DOB: 09-Jul-1950, 66 y.o.   MRN: [id number]   [name] 05/22/2016, 9:18 AM Called in bio-tech brace order; spoke with Colletta [place]

## 2016-05-22 NOTE — Progress Notes (Signed)
Patient ID: [name], male   DOB: 27-Aug-1950, 66 y.o.   MRN: [id number] Patient doing a better this morning with pain. Had an episode where he had a swollen left hip last night CT scan performed showed a hematoma but no acute fracture or dislocation.  Patient neurologically stable  Patient with segmental degeneration above his previous fusion with difficult pain management. Pain in his back seems to be better although the brace crates more discomfort than it helps. We will consider getting him a lumbar corset. Primary medical service should consider stopping his anticoagulation if possible secondary to, complication with the left hip hematoma. My recommendation is to see how his pain does over the next couple days if he gets better he can be discharged with scheduled follow-up as an outpatient if pain doesn't get better then we'll consider extending his stabilization neck suite provided that his general medical status is stable after surgery he has been off anticoagulation for 4-5 days prior to surgery and all issues with his bowels have resolved and his pneumonia is adequately treated.

## 2016-05-23 LAB — CBC
HCT: 35.6 % — ABNORMAL LOW (ref 39.0–52.0)
Hemoglobin: 11.5 g/dL — ABNORMAL LOW (ref 13.0–17.0)
MCH: 27.4 pg (ref 26.0–34.0)
MCHC: 32.3 g/dL (ref 30.0–36.0)
MCV: 84.8 fL (ref 78.0–100.0)
Platelets: 264 10*3/uL (ref 150–400)
RBC: 4.2 MIL/uL — ABNORMAL LOW (ref 4.22–5.81)
RDW: 15.5 % (ref 11.5–15.5)
WBC: 12.3 10*3/uL — ABNORMAL HIGH (ref 4.0–10.5)

## 2016-05-23 LAB — BASIC METABOLIC PANEL
Anion gap: 9 (ref 5–15)
BUN: 11 mg/dL (ref 6–20)
CO2: 28 mmol/L (ref 22–32)
Calcium: 8.7 mg/dL — ABNORMAL LOW (ref 8.9–10.3)
Chloride: 98 mmol/L — ABNORMAL LOW (ref 101–111)
Creatinine, Ser: 0.92 mg/dL (ref 0.61–1.24)
GFR calc Af Amer: >60 mL/min (ref >60)
GFR calc non Af Amer: >60 mL/min (ref >60)
Glucose, Bld: 93 mg/dL (ref 65–99)
Potassium: 4 mmol/L (ref 3.5–5.1)
Sodium: 135 mmol/L (ref 135–145)

## 2016-05-23 MED ORDER — METHOCARBAMOL 1000 MG/10ML IJ SOLN
500.0000 mg | Freq: Four times a day (QID) | INTRAMUSCULAR | Status: DC | PRN
  Administered 2016-05-23 (×2): 500 mg via INTRAVENOUS
  Filled 2016-05-23 (×6): qty 5

## 2016-05-23 NOTE — Progress Notes (Signed)
Patient ID: [name], male   DOB: 1950-10-12, 65 y.o.   MRN: [id number] Notes from physical therapy and social work reviewed and I agree with action plan of transfer the patient to a skilled nursing facility to further recover from the issues of brought in the hospital IE pneumonia ileus small bowel obstruction etc. Pain seems be better control patient mobilizing with therapy. Patient can follow-up with me in 2 weeks and we will assess his status her much pain is an and plan revision surgery accordingly.

## 2016-05-23 NOTE — Progress Notes (Signed)
Physical Therapy Treatment Patient Details Name: [name] MRN: [id number] DOB: [birth year] Today's Date: 05/23/2016    History of Present Illness 66 y.o. male admitted to [facility] on 05/13/16 for acute worsening of his chronic low back pain.  Pt dx with acute on chronic low back pain, neurosurgery does not feel surgical intervention is appropriate.  Pt also found to have cellulitis L LE, PNA, and abdominal distention (ileum vs partial SBO).  Pt with significant PMHx of tubular adenoma of the colon, TIA, dilation of esophageal narrowing, RA, oil TKA, HTN, MI, neuropathy, hepatitis A, HIV, gout, fibromyalgia, DVT L LE (chronic), CAD, CLL, chronic back pain (s/p multiple spine surgeries), bil shoulder surgery, and CABG.    PT Comments    Pt is continuing to progress his gait, but has a hard time with mobility and self care (can't put his shoes on, can't donn his brace independently).  He will struggle at home.  SNF is his safest option to bridge the gap between this hospital stay and his pending spine surgery.  His O2 sats were 98% HR 80 bpm today at rest.  PT will continue to follow acutely.   Follow Up Recommendations  SNF     Equipment Recommendations  None recommended by PT    Recommendations for Other Services   NA     Precautions / Restrictions Precautions Precautions: Fall Required Braces or Orthoses: Spinal Brace Spinal Brace: Applied in sitting position;Lumbar corset    Mobility  Bed Mobility Overal bed mobility: Needs Assistance Bed Mobility: Sidelying to Sit;Sit to Sidelying Rolling: Modified independent (Device/Increase time) Sidelying to sit: Modified independent (Device/Increase time)     Sit to sidelying: Min assist General bed mobility comments: Min assist only to help get bil feet back up in bed from sitting, otherwise pt with heavy reliance on arms and bed rails for transitional movements.   Transfers Overall transfer level: Needs assistance Equipment used:  4-wheeled walker Transfers: Sit to/from Stand Sit to Stand: Min guard         General transfer comment: Min guard assist to ensure he gets his legs locked out underneath him.   Ambulation/Gait Ambulation/Gait assistance: Min guard Ambulation Distance (Feet): 510 Feet Assistive device: 4-wheeled walker Gait Pattern/deviations: Step-through pattern;Shuffle Gait velocity: decreased Gait velocity interpretation: Below normal speed for age/gender General Gait Details: Pt continues to walk with flexed trunk and is able to for short distances straighten up (~25') with heavy relaince on his arms to do so.  He does feel better with lumbar corset on and snug.            Balance Overall balance assessment: Needs assistance Sitting-balance support: Feet supported;No upper extremity supported Sitting balance-[name]: Fair     Standing balance support: Bilateral upper extremity supported Standing balance-[name]: Poor                      Cognition Arousal/Alertness: Awake/alert Behavior During Therapy: WFL for tasks assessed/performed Overall Cognitive Status: Within Functional Limits for tasks assessed                         General Comments General comments (skin integrity, edema, etc.): total assist to donn/doff brace EOB.       Pertinent Vitals/Pain Pain Assessment: 0-10 Pain Score: 9  Pain Location: left low back, hip and buttocks Pain Descriptors / Indicators: Grimacing;Guarding Pain Intervention(s): Limited activity within patient's tolerance;Monitored during session;Repositioned;Other (comment) (requested muscle relaxer  from RN)           PT Goals (current goals can now be found in the care plan section) Acute Rehab PT Goals Patient Stated Goal: to decrease pain and maintain or improve his mobility Progress towards PT goals: Progressing toward goals    Frequency    Min 3X/week      PT Plan Current plan remains appropriate        End of Session Equipment Utilized During Treatment: Back brace Activity Tolerance: Patient limited by pain Patient left: in bed;with call bell/phone within reach     Time: 1122-1200 PT Time Calculation (min) (ACUTE ONLY): 38 min  Charges:  $Gait Training: 23-37 mins $Therapeutic Activity: 8-22 mins                     Elany Felix B. Trosky, Burt, DPT [phone]   05/23/2016, 12:33 PM

## 2016-05-23 NOTE — Progress Notes (Signed)
PROGRESS NOTE    [name]  [id number] DOB: 1950/07/03 DOA: 05/13/2016 PCP: [name], MD   Brief Narrative:  66 y.o.malewith medical history significant for HIV, CLL, CAD status post CABG, rheumatoid arthritis, and degenerative disc disease with chronic low back pain status post posterior fusion, presenting to the emergency department with acute worsening of his chronic low back pain. Patient reports that he was in his usual state of health until 05/09/2016 when he experienced insidious worsening in his chronic low back pain. There was no precipitating event that he could recall, no fall or trauma, and no heavy lifting or increased exertion.     Assessment & Plan:   Principal Problem:   Intractable pain Active Problems:   CAD (coronary artery disease) of artery bypass graft   HIV disease (HCC)   Rheumatoid arthritis (HCC)   Chronic lymphoblastic leukemia (HCC)   Hypertension   DVT, lower extremity, recurrent (HCC)   Depression   Leg weakness, bilateral   Chronic low back pain with sciatica   Intractable back pain   Cellulitis of leg, left   Incidental lung nodule, > 70mm and < 52mm   Acute on chronic mid and low-back pain, intractable  - Pt with chronic LBP s/p fusion  - There was no precipitating event identified; no fevers or wt-loss reported; no incontinence or saddle paraesthesia  - CT L-spine with stable chronic changes; no mass, infection, or significant canal stenosis noted  - MRI L-spine with no specific acute finding to explain the cause  - MRI thoracic spine  -plan for SNF and Dr. Saintclair Halsted will follow up in 2 weeks and possible surgery after than   Hip hematoma -d/c anticoagulation -place SCDs -h/h stable  Cellulitis, LLE - resolved  - completed therapy with clindamycin  HIV  - CD4 was 1,140 and VL 31 in October 2017  - Continue current management with Prezista and Genvoya   Hx of recurrent DVT  - d/c Savaysa due to hip hematoma and possible  surgery - There is LLE edema, possibly related to post-thrombotic syndrome or pelvic and retroperitoneal adenopathy  - US performed and negative for DVT  -SCDs  CLL  - Pt continues to follow with oncology, not currently on any treatments  - CT abd/pelvis demonstrates a mild increase in retroperitoneal and pelvic adenopathy  - outpatient follow up  CAD - No anginal complaints  - Continue ASA, Lipitor, Toprol   Hypertension, essential  - reasonable inpatient control   Rheumatoid arthritis  - Continue prednisone 5 mg daily   Incidental lung nodule  - 5 mm LLL nodule noted on CT  -outpatient follow up   LLL PNA - Zithromax added 1/3, changed to Levaquin and course completed -  repeat CXR shows improvement  Abd distension - ileus vs pSBO - on bowel regimen  -resolved--- tolerating food   Hyponatremia - stable    Right testicular swelling - testicular US with evidence of a degree of epididymitis on the right, several small cysts noted in the right epididymis. No intratesticular mass or inflammatory focus. No testicular torsion. Evidence of prior inflammation in the left scrotal sac with calcification along the left scrotal sac. - There is again noted a fairly large hydrocele on the right. Mild debris in hydrocele on the right is probably due to the epididymitis - Case was discussed with Dr Tresa Moore by Dr Doyle Askew  who recommended increasing the frequency of Bactrim, pt is currently on Bactrim QD for prophylaxis (due to  HIV) - increased the dose to BID as recommended and to continue this for total 14 days - also will ask for ice pack to the affected area and scrotal support, this will likely take several weeks to resolved but no intervention indicated at this time unless pt becomes hemodynamically unstable   Thrombocytopenia - chronic    Obesity    Hypokalemia - BMP in AM  DVT prophylaxis: scd Code Status: Full  Family Communication: Patient at bedside    Disposition Plan: d/c to SNF in AM   Consultants:   Neurosurgery, Dr Saintclair Halsted who knows the patient well from the previous sx   Surgery   Dr. Tresa Moore Urologist, over the phone     Antimicrobials:   Clindamycin 1/2 --> 1/8  Zithromax 1/3 --> 1/8  Levaquin 1/8 --> 1/9  Bactrim at home QD, increase the frequency to BID 1/9 --> needs 14 days therapy     Subjective: Walked well with PT yesterday-- has a spasm in lower back today-- robaxin ordered  Objective: Vitals:   05/22/16 1010 05/22/16 1404 05/22/16 2036 05/23/16 0614  BP: 120/65 100/65 103/70 107/62  Pulse: 70 85 81 78  Resp:  19 16 18   Temp:  98.7 F (37.1 C) 98 F (36.7 C) 98.1 F (36.7 C)  TempSrc:  Oral Oral Oral  SpO2:  93% 97% 96%  Weight:      Height:        Intake/Output Summary (Last 24 hours) at 05/23/16 0856 Last data filed at 05/23/16 0615  Gross per 24 hour  Intake              360 ml  Output             1300 ml  Net             -940 ml   Filed Weights   05/13/16 2300 05/19/16 0513  Weight: 91.3 kg (201 lb 4.5 oz) 55.1 kg (121 lb 7.6 oz)    Examination:  General exam: Appears calm and comfortable  Respiratory system: Clear to auscultation. Respiratory effort normal. Cardiovascular system: S1 & S2 heard, RRR. No JVD, murmurs, rubs, gallops or clicks. No pedal edema. Gastrointestinal system: Abdomen is nondistended, soft and nontender. No organomegaly or masses felt. Normal bowel sounds heard. Central nervous system: Alert and oriented. No focal neurological deficits. Extremities: Symmetric 5 x 5 power. Skin: decreasing size lump on hip    Data Reviewed:   CBC:  Recent Labs Lab 05/18/16 0402 05/19/16 0817 05/20/16 0836 05/21/16 0531 05/23/16 0547  WBC 10.4 9.2 9.8 9.5 12.3*  HGB 11.0* 12.5* 12.4* 12.0* 11.5*  HCT 33.0* 37.6* 37.5* 37.1* 35.6*  MCV 84.6 83.9 84.7 86.5 84.8  PLT 150 185 190 206 [id number]   Basic Metabolic Panel:  Recent Labs Lab 05/18/16 0402 05/19/16 0817  05/20/16 0836 05/21/16 0531 05/23/16 0547  NA 131* 132* 132* 132* 135  K 3.8 3.4* 3.9 4.2 4.0  CL 96* 95* 97* 96* 98*  CO2 28 29 28 29 28   GLUCOSE 96 115* 117* 91 93  BUN 9 6 6 8 11   CREATININE 0.81 0.86 0.80 0.82 0.92  CALCIUM 8.0* 8.3* 8.6* 8.8* 8.7*   GFR: Estimated Creatinine Clearance: 59.9 mL/min (by C-G formula based on SCr of 0.92 mg/dL). Liver Function Tests: No results for input(s): AST, ALT, ALKPHOS, BILITOT, PROT, ALBUMIN in the last 168 hours. No results for input(s): LIPASE, AMYLASE in the last 168 hours. No results for input(s): AMMONIA  in the last 168 hours. Coagulation Profile: No results for input(s): INR, PROTIME in the last 168 hours. Cardiac Enzymes: No results for input(s): CKTOTAL, CKMB, CKMBINDEX, TROPONINI in the last 168 hours. BNP (last 3 results) No results for input(s): PROBNP in the last 8760 hours. HbA1C: No results for input(s): HGBA1C in the last 72 hours. CBG:  Recent Labs Lab 05/21/16 0809 05/22/16 0645  GLUCAP 89 91   Lipid Profile: No results for input(s): CHOL, HDL, LDLCALC, TRIG, CHOLHDL, LDLDIRECT in the last 72 hours. Thyroid Function Tests: No results for input(s): TSH, T4TOTAL, FREET4, T3FREE, THYROIDAB in the last 72 hours. Anemia Panel: No results for input(s): VITAMINB12, FOLATE, FERRITIN, TIBC, IRON, RETICCTPCT in the last 72 hours. Sepsis Labs: No results for input(s): PROCALCITON, LATICACIDVEN in the last 168 hours.  Recent Results (from the past 240 hour(s))  Respiratory Panel by PCR     Status: None   Collection Time: 05/17/16 11:39 AM  Result Value Ref Range Status   Adenovirus NOT DETECTED NOT DETECTED Final   Coronavirus 229E NOT DETECTED NOT DETECTED Final   Coronavirus HKU1 NOT DETECTED NOT DETECTED Final   Coronavirus NL63 NOT DETECTED NOT DETECTED Final   Coronavirus OC43 NOT DETECTED NOT DETECTED Final   Metapneumovirus NOT DETECTED NOT DETECTED Final   Rhinovirus / Enterovirus NOT DETECTED NOT DETECTED  Final   Influenza A NOT DETECTED NOT DETECTED Final   Influenza B NOT DETECTED NOT DETECTED Final   Parainfluenza Virus 1 NOT DETECTED NOT DETECTED Final   Parainfluenza Virus 2 NOT DETECTED NOT DETECTED Final   Parainfluenza Virus 3 NOT DETECTED NOT DETECTED Final   Parainfluenza Virus 4 NOT DETECTED NOT DETECTED Final   Respiratory Syncytial Virus NOT DETECTED NOT DETECTED Final   Bordetella pertussis NOT DETECTED NOT DETECTED Final   Chlamydophila pneumoniae NOT DETECTED NOT DETECTED Final   Mycoplasma pneumoniae NOT DETECTED NOT DETECTED Final         Radiology Studies: Ct Pelvis Wo Contrast  Result Date: 05/21/2016 CLINICAL DATA:  Pain with a bump on the left lateral hip starting yesterday. Bump is increasing in size. No known injury. EXAM: CT PELVIS WITHOUT CONTRAST TECHNIQUE: Multidetector CT imaging of the pelvis was performed following the standard protocol without intravenous contrast. COMPARISON:  05/13/2016 FINDINGS: Urinary Tract: No bladder wall thickening or filling defect. Distal ureters are decompressed. Bowel: Visualized portions of the small bowel and colon are decompressed. No evidence of obstruction or inflammation. Scattered diverticula in the sigmoid colon. Vascular/Lymphatic: Mild calcification in the abdominal aorta and iliac vessels. Prominent retroperitoneal and pelvic lymphadenopathy. This is consistent with patient history of HIV and chronic lymphocytic leukemia. Similar appearance to prior study. Reproductive: Prostate gland is not enlarged. Small scrotal hydrocele is noted. Other:  No free air or free fluid in the pelvis. Musculoskeletal: There is a hematoma posterior and lateral to the inter trochanteric region of the left hip. The hematoma measures about 5.3 x 9.5 x 9.1 cm. This finding is new since previous study. No evidence of acute fracture or dislocation in the pelvis, sacrum, or hips. Degenerative changes in the lower lumbar spine and in the hips.  Postoperative change in the lower lumbar spine. IMPRESSION: New development of a hematoma adjacent to the left hip. No evidence of acute fracture or dislocation. Extensive lymphadenopathy in the retroperitoneum and pelvis is again demonstrated without significant change. A small scrotal hydrocele is noted. Electronically Signed   By: Lucienne Capers M.D.   On: 05/21/2016 21:56  Scheduled Meds: . aspirin EC  81 mg Oral Daily  . atorvastatin  10 mg Oral QHS  . Darunavir Ethanolate  800 mg Oral Q breakfast  . elvitegravir-cobicistat-emtricitabine-tenofovir  1 tablet Oral Q breakfast  . erythromycin   Both Eyes Q6H  . escitalopram  10 mg Oral Daily  . guaiFENesin  600 mg Oral BID  . loratadine  10 mg Oral Daily  . metoprolol succinate  25 mg Oral Daily  . pantoprazole  40 mg Oral BID AC  . predniSONE  5 mg Oral Q breakfast  . pregabalin  200 mg Oral BID  . saccharomyces boulardii  250 mg Oral BID  . senna-docusate  1 tablet Oral BID  . sodium chloride flush  3 mL Intravenous Q12H  . sulfamethoxazole-trimethoprim  1 tablet Oral Q12H  . valACYclovir  500 mg Oral Daily   Continuous Infusions:   LOS: 9 days    Time spent: 25 mins     [place], DO [facility] Pager [phone]  If 7PM-7AM, please contact night-coverage [email or web address] Password [facility] 05/23/2016, 8:56 AM

## 2016-05-24 DIAGNOSIS — R531 Weakness: Secondary | ICD-10-CM | POA: Diagnosis not present

## 2016-05-24 DIAGNOSIS — F329 Major depressive disorder, single episode, unspecified: Secondary | ICD-10-CM | POA: Diagnosis not present

## 2016-05-24 DIAGNOSIS — C9510 Chronic leukemia of unspecified cell type not having achieved remission: Secondary | ICD-10-CM | POA: Diagnosis not present

## 2016-05-24 DIAGNOSIS — R52 Pain, unspecified: Secondary | ICD-10-CM

## 2016-05-24 DIAGNOSIS — D696 Thrombocytopenia, unspecified: Secondary | ICD-10-CM | POA: Diagnosis not present

## 2016-05-24 DIAGNOSIS — I10 Essential (primary) hypertension: Secondary | ICD-10-CM | POA: Diagnosis not present

## 2016-05-24 DIAGNOSIS — R2689 Other abnormalities of gait and mobility: Secondary | ICD-10-CM | POA: Diagnosis not present

## 2016-05-24 DIAGNOSIS — B20 Human immunodeficiency virus [HIV] disease: Secondary | ICD-10-CM | POA: Diagnosis not present

## 2016-05-24 DIAGNOSIS — M519 Unspecified thoracic, thoracolumbar and lumbosacral intervertebral disc disorder: Secondary | ICD-10-CM | POA: Diagnosis not present

## 2016-05-24 DIAGNOSIS — N5089 Other specified disorders of the male genital organs: Secondary | ICD-10-CM | POA: Diagnosis not present

## 2016-05-24 DIAGNOSIS — C91Z0 Other lymphoid leukemia not having achieved remission: Secondary | ICD-10-CM | POA: Diagnosis not present

## 2016-05-24 DIAGNOSIS — G8929 Other chronic pain: Secondary | ICD-10-CM | POA: Diagnosis not present

## 2016-05-24 DIAGNOSIS — M549 Dorsalgia, unspecified: Secondary | ICD-10-CM | POA: Diagnosis not present

## 2016-05-24 DIAGNOSIS — Z981 Arthrodesis status: Secondary | ICD-10-CM | POA: Diagnosis not present

## 2016-05-24 DIAGNOSIS — M5440 Lumbago with sciatica, unspecified side: Secondary | ICD-10-CM | POA: Diagnosis not present

## 2016-05-24 DIAGNOSIS — M6281 Muscle weakness (generalized): Secondary | ICD-10-CM | POA: Diagnosis not present

## 2016-05-24 DIAGNOSIS — Z951 Presence of aortocoronary bypass graft: Secondary | ICD-10-CM | POA: Diagnosis not present

## 2016-05-24 DIAGNOSIS — I82409 Acute embolism and thrombosis of unspecified deep veins of unspecified lower extremity: Secondary | ICD-10-CM

## 2016-05-24 DIAGNOSIS — I2510 Atherosclerotic heart disease of native coronary artery without angina pectoris: Secondary | ICD-10-CM | POA: Diagnosis not present

## 2016-05-24 DIAGNOSIS — M069 Rheumatoid arthritis, unspecified: Secondary | ICD-10-CM | POA: Diagnosis not present

## 2016-05-24 LAB — GLUCOSE, CAPILLARY: Glucose-Capillary: 93 mg/dL (ref 65–99)

## 2016-05-24 MED ORDER — SULFAMETHOXAZOLE-TRIMETHOPRIM 800-160 MG PO TABS: 1.0000 | ORAL_TABLET | Freq: Two times a day (BID) | ORAL | 0 refills | Status: AC

## 2016-05-24 MED ORDER — ACETAMINOPHEN 325 MG PO TABS: 650.0000 mg | ORAL_TABLET | Freq: Four times a day (QID) | ORAL | 0 refills | Status: DC | PRN

## 2016-05-24 NOTE — Discharge Instructions (Signed)
Ileus Introduction Ileus is a condition in which the intestines, also called the bowels, stop working and moving correctly. If the intestines stop working, food cannot pass through to get digested. The intestines are hollow organs that digest food after the food leaves the stomach. These organs are long, muscular tubes that connect the stomach to the rectum. When ileus occurs, the muscular contractions that cause food to move through the intestines stop happening as they normally would. Ileus can occur for various reasons. This condition is a serious problem that usually requires hospitalization. It can cause symptoms such as nausea, abdominal pain, and bloating. Ileus can last from a few hours to a few days. If the intestines stop working because of a blockage, that is a different condition that is called a bowel obstruction. What are the causes? This condition may be caused by:  Surgery on the abdomen.  An infection or inflammation in the abdomen. This includes inflammation of the lining of the abdomen (peritonitis).  Infection or inflammation in other parts of the body, such as pneumonia or pancreatitis.  Passage of gallstones or kidney stones.  Damage to the nerves or blood vessels that go to the intestines.  A collection of blood within the abdominal cavity.  Imbalance in the salts in the blood (electrolytes).  Injury to the brain or spinal cord.  Medicines. Many medicines, including strong pain medicines, can cause ileus or make it worse. What are the signs or symptoms? Symptoms of this condition include:  Bloating of the abdomen.  Pain or discomfort in the abdomen.  Poor appetite.  Nausea and vomiting.  Lack of normal bowel sounds, such as growling" in the stomach. How is this diagnosed? This condition may be diagnosed with:  A physical exam and medical history.  X-rays or a CT scan of the abdomen. You may also have other tests to help find the cause of the  condition. How is this treated? Treatment for this condition may include:  Resting the intestines until they start to work again. This is often done by:  Stopping oral intake of food and drink. You will be given fluid through an IV tube to prevent dehydration.  Placing a small tube (nasogastric tube or NG tube) that is passed through your nose and into your stomach. The tube is attached to a suction device and keeps the stomach emptied out. This allows the bowels to rest and also helps to reduce nausea and vomiting.  Correcting any electrolyte imbalance by giving supplements in the IV fluid.  Stopping any medicines that might make ileus worse.  Treating any condition that may have caused ileus. Follow these instructions at home:  Follow instructions from your health care provider about diet and fluid intake. Usually, you will be told to:  Drink plenty of clear fluids.  Avoid alcohol.  Avoid caffeine.  Eat a bland diet.  Get plenty of rest. Return to your normal activities as told by your health care provider.  Take over-the-counter and prescription medicines only as told by your health care provider.  Keep all follow-up visits as told by your health care provider. This is important. Contact a health care provider if:  You have nausea, vomiting, or abdominal discomfort.  You have a fever. Get help right away if:  You have severe abdominal pain or bloating.  You cannot eat or drink without vomiting. This information is not intended to replace advice given to you by your health care provider. Make sure you discuss any questions  you have with your health care provider. Document Released: 05/01/2003 Document Revised: 10/04/2015 Document Reviewed: 06/22/2014  2017 Elsevier   Community-Acquired Pneumonia, Adult Introduction Pneumonia is an infection of the lungs. One type of pneumonia can happen while a person is in a hospital. A different type can happen when a person is  not in a hospital (community-acquired pneumonia). It is easy for this kind to spread from person to person. It can spread to you if you breathe near an infected person who coughs or sneezes. Some symptoms include:  A dry cough.  A wet (productive) cough.  Fever.  Sweating.  Chest pain. Follow these instructions at home:  Take over-the-counter and prescription medicines only as told by your doctor.  Only take cough medicine if you are losing sleep.  If you were prescribed an antibiotic medicine, take it as told by your doctor. Do not stop taking the antibiotic even if you start to feel better.  Sleep with your head and neck raised (elevated). You can do this by putting a few pillows under your head, or you can sleep in a recliner.  Do not use tobacco products. These include cigarettes, chewing tobacco, and e-cigarettes. If you need help quitting, ask your doctor.  Drink enough water to keep your pee (urine) clear or pale yellow. A shot (vaccine) can help prevent pneumonia. Shots are often suggested for:  People older than 68 years of age.  People older than 68 years of age:  Who are having cancer treatment.  Who have long-term (chronic) lung disease.  Who have problems with their body's defense system (immune system). You may also prevent pneumonia if you take these actions:  Get the flu (influenza) shot every year.  Go to the dentist as often as told.  Wash your hands often. If soap and water are not available, use hand sanitizer. Contact a doctor if:  You have a fever.  You lose sleep because your cough medicine does not help. Get help right away if:  You are short of breath and it gets worse.  You have more chest pain.  Your sickness gets worse. This is very serious if:  You are an older adult.  Your body's defense system is weak.  You cough up blood. This information is not intended to replace advice given to you by your health care provider. Make sure  you discuss any questions you have with your health care provider. Document Released: 10/15/2007 Document Revised: 10/04/2015 Document Reviewed: 08/23/2014  2017 Elsevier

## 2016-05-24 NOTE — Clinical Social Work Note (Signed)
Pt will go to [place] today. Facility notified and ready. Awaiting d/c and prescriptions. CSW will set up [facility] once completed.  [address] [place]

## 2016-05-24 NOTE — Progress Notes (Signed)
Per RN, pt is ready to be d/c and he wants to go to [facility]. Contacted Bridgette, SW and left her a VM.

## 2016-05-24 NOTE — Progress Notes (Signed)
Report called to [facility], LPN at [facility].  [facility] called.  Will assist patient in dressing and remove IV.  Daughter at bedside.  Pain medication will be provided prior to discharge.  CSW to fax the latest AVS to facility.

## 2016-05-24 NOTE — Clinical Social Work Note (Signed)
RN to call [phone] for report prior to d/c.  [address], [place]

## 2016-05-24 NOTE — Clinical Social Work Note (Signed)
Clinical Social Worker facilitated patient discharge including contacting patient family and facility to confirm patient discharge plans.  Clinical information faxed to facility and family agreeable with plan.  CSW arranged ambulance transport via [facility] to [place].  RN to call for report prior to discharge.  Clinical Social Worker will sign off for now as social work intervention is no longer needed. Please consult [place] again if new need arises.  [address] [place]

## 2016-05-24 NOTE — Discharge Summary (Addendum)
Physician Discharge Summary  [name] [id number] DOB: 1950-07-18 DOA: 05/13/2016  PCP: [name], MD  Admit date: 05/13/2016 Discharge date: 05/24/2016  Recommendations for Outpatient Follow-up:  1. Check CBC and BMP per SNF protocol 2. Bactrim from 05/20/16 through 06/03/2016  Discharge Diagnoses:  Principal Problem:   Intractable pain Active Problems:   CAD (coronary artery disease) of artery bypass graft   HIV disease (HCC)   Rheumatoid arthritis ([employer])   Chronic lymphoblastic leukemia ([place])   Hypertension   DVT, lower extremity, recurrent (HCC)   Depression   Leg weakness, bilateral   Chronic low back pain with sciatica   Intractable back pain   Cellulitis of leg, left   Incidental lung nodule, > 44mm and < 72mm    Discharge Condition: stable   Diet recommendation: as tolerated   History of present illness:   Per brief narrative on 05/23/2016 "66 y.o.malewith medical history significant for HIV, CLL, CAD status post CABG, rheumatoid arthritis, and degenerative disc disease with chronic low back pain status post posterior fusion, presenting to the emergency department with acute worsening of his chronic low back pain. Patient reports that he was in his usual state of health until 05/09/2016 when he experienced insidious worsening in his chronic low back pain. There was no precipitating event that he could recall, no fall or trauma, and no heavy lifting or increased exertion."  Hospital Course:   Acute on chronic mid and low-back pain, intractable  - Pt with chronic LBP s/p fusion  - There was no precipitating event identified; no fevers or wt-loss reported; no incontinence or saddle paraesthesia  - CT L-spine with stable chronic changes; no mass, infection, or significant canal stenosis noted  - MRI L-spine with no specific acute finding to explain the cause  - MRI thoracic spine  - Plan for SNF and Dr. Saintclair Halsted will follow up in 2 weeks and possible surgery after     Hip hematoma - D/C'ed anticoagulation - hemoglobin stable   Cellulitis, LLE - resolved  - Completed therapy with clindamycin  HIV  - CD4 was 1,140 and VL 31 in October 2017  - Continue Prezista and Genvoya   Hx of recurrent DVT  - D/C'ed Savaysa due to hip hematoma and possible surgery - There is LLE edema, possibly related to post-thrombotic syndrome or pelvic and retroperitoneal adenopathy  - US performed and negative for DVT  - SCDs for DVT prophylaxis   CLL  - Pt continues to follow with oncology, not currently on any treatments  - CT abd/pelvis demonstrates a mild increase in retroperitoneal and pelvic adenopathy   CAD - No anginal complaints  - Continue ASA, Lipitor, Toprol   Hypertension, essential  - reasonable inpatient control   Rheumatoid arthritis  - Continue prednisone 5 mg daily   Incidental lung nodule  - 5 mm LLL nodule noted on CT  - Outpatient follow up   LLL PNA - Zithromax added 1/3, changed to Levaquin and course completed - Repeat CXR shows improvement  Abd distension - ileus vs pSBO - on bowel regimen  - resolved--- tolerating food   Hyponatremia  - Stable    Right testicular swelling - testicular US with evidence of a degree of epididymitis on the right, several small cysts noted in the right epididymis. No intratesticular mass or inflammatory focus. No testicular torsion. Evidence of prior inflammation in the left scrotal sac with calcification along the left scrotal sac. - There is again noted a fairly  large hydrocele on the right. Mild debris in hydrocele on the right is probably due to the epididymitis - Case was discussed with Dr Tresa Moore by Dr Doyle Askew  who recommended increasing the frequency of Bactrim, pt is currently on Bactrim QD for prophylaxis (due to HIV) - increased the dose to BID as recommended and to continue this for total 14 days (end date 1/23) - Ice pack to the affected area and scrotal support, this will  likely take several weeks to resolved but no intervention indicated at this time unless pt becomes hemodynamically unstable   Thrombocytopenia - In the setting of history of CLL - Chronic    Hypokalemia - Repeat potassium WNL    DVT prophylaxis:scd Code Status:Full  Family Communication:Patient at bedside    Consultants:  Neurosurgery, Dr Saintclair Halsted who knows the patient well from the previous sx   Surgery   Dr. Tresa Moore Urologist, over the phone     Antimicrobials:   Clindamycin 1/2 --> 1/8  Zithromax 1/3 --> 1/8  Levaquin 1/8 -->1/9  Bactrim at home QD, increase the frequency to BID 1/9 -->needs 14 days therapy   Signed:  Leisa Lenz, MD  [facility] 05/24/2016, 11:01 AM  Pager #: [phone]  Time spent in minutes: less than 30 minutes   Discharge Exam: Vitals:   05/24/16 0513 05/24/16 0800  BP: 113/75 (!) 111/57  Pulse: 76 74  Resp: 18 18  Temp: 98.6 F (37 C)    Vitals:   05/23/16 1426 05/23/16 2106 05/24/16 0513 05/24/16 0800  BP: 104/61 109/76 113/75 (!) 111/57  Pulse: 86 79 76 74  Resp: 18 19 18 18   Temp: 97.6 F (36.4 C) 98.5 F (36.9 C) 98.6 F (37 C)   TempSrc: Oral Oral Oral   SpO2: 97% 99% 99% 99%  Weight:      Height:        General: Pt is alert, follows commands appropriately, not in acute distress Cardiovascular: Regular rate and rhythm, S1/S2 + Respiratory: Clear to auscultation bilaterally, no wheezing, no crackles, no rhonchi Abdominal: Soft, non tender, non distended, bowel sounds +, no guarding Extremities: no cyanosis, pulses palpable bilaterally DP and PT Neuro: Grossly nonfocal  Discharge Instructions  Discharge Instructions    Call MD for:  persistant nausea and vomiting    Complete by:  As directed    Call MD for:  redness, tenderness, or signs of infection (pain, swelling, redness, odor or green/yellow discharge around incision site)    Complete by:  As directed    Call MD for:  severe  uncontrolled pain    Complete by:  As directed    Diet - low sodium heart healthy    Complete by:  As directed    Diet - low sodium heart healthy    Complete by:  As directed    Increase activity slowly    Complete by:  As directed    Increase activity slowly    Complete by:  As directed      Allergies as of 05/24/2016      Reactions   Morphine Other (See Comments)   REACTION: severe headache   Other Anaphylaxis, Hives   Pecan   Peanut-containing Drug Products Anaphylaxis, Hives   Swelling of throat   Oxycodone-acetaminophen Other (See Comments)   REACTION: headache   Penicillins Rash   Has patient had a PCN reaction causing immediate rash, facial/tongue/throat swelling, SOB or lightheadedness with hypotension: Yes Has patient had a PCN reaction causing severe  rash involving mucus membranes or skin necrosis: No Has patient had a PCN reaction that required hospitalization No Has patient had a PCN reaction occurring within the last 10 years: No If all of the above answers are "NO", then may proceed with Cephalosporin use. REACTION: red, flushed   Promethazine Hcl Other (See Comments)   REACTION: makes him feel drunk      Medication List    STOP taking these medications   SAVAYSA 30 MG Tabs tablet Generic drug:  edoxaban   zolpidem 5 MG tablet Commonly known as:  AMBIEN     TAKE these medications   acetaminophen 325 MG tablet Commonly known as:  TYLENOL Take 2 tablets (650 mg total) by mouth every 6 (six) hours as needed for mild pain (or Fever >/= 101).   aspirin EC 81 MG tablet Take 81 mg by mouth daily.   atorvastatin 10 MG tablet Commonly known as:  LIPITOR TAKE 1 TABLET (10 MG TOTAL) BY MOUTH AT BEDTIME.   ciclopirox 0.77 % Susp Commonly known as:  LOPROX Apply 1 Act topically 2 (two) times daily.   CIMZIA Haugen Inject into the skin. Pt unsure of dose. Every four weeks.   escitalopram 10 MG tablet Commonly known as:  LEXAPRO Take 1 tablet (10 mg total) by  mouth daily.   fluticasone 50 MCG/ACT nasal spray Commonly known as:  FLONASE Place 2 sprays into both nostrils daily.   furosemide 20 MG tablet Commonly known as:  LASIX TAKE 1 TABLET (20 MG TOTAL) BY MOUTH 2 (TWO) TIMES DAILY.   GENVOYA 150-150-200-10 MG Tabs tablet Generic drug:  elvitegravir-cobicistat-emtricitabine-tenofovir TAKE 1 TABLET BY MOUTH DAILY WITH BREAKFAST.   loratadine 10 MG tablet Commonly known as:  CLARITIN TAKE 1 TABLET (10 MG TOTAL) BY MOUTH DAILY.   methocarbamol 500 MG tablet Commonly known as:  ROBAXIN Take 1 tablet (500 mg total) by mouth every 8 (eight) hours as needed for muscle spasms.   metoprolol succinate 25 MG 24 hr tablet Commonly known as:  TOPROL-XL Take 1 tablet (25 mg total) by mouth daily. Please schedule appointment for refills.   pantoprazole 40 MG tablet Commonly known as:  PROTONIX Take 1 tablet (40 mg total) by mouth 2 (two) times daily before a meal. What changed:  when to take this   polyethylene glycol packet Commonly known as:  MIRALAX / GLYCOLAX Take 17 g by mouth daily as needed for mild constipation.   predniSONE 5 MG tablet Commonly known as:  DELTASONE Take 5 mg by mouth daily with breakfast.   pregabalin 200 MG capsule Commonly known as:  LYRICA Take 1 capsule (200 mg total) by mouth 2 (two) times daily.   PREZISTA 800 MG tablet Generic drug:  Darunavir Ethanolate TAKE 1 TABLET (800 MG TOTAL) BY MOUTH DAILY.   saccharomyces boulardii 250 MG capsule Commonly known as:  FLORASTOR Take 1 capsule (250 mg total) by mouth 2 (two) times daily.   sulfamethoxazole-trimethoprim 800-160 MG tablet Commonly known as:  BACTRIM DS,SEPTRA DS Take 1 tablet by mouth every 12 (twelve) hours. What changed:  when to take this   valACYclovir 500 MG tablet Commonly known as:  VALTREX TAKE 1 TABLET BY MOUTH EVERY DAY          Follow-up Information    [name], MD Follow up.   Specialty:  Internal Medicine Contact  information: [address] [address] [phone]        Scharlene Gloss, MD  Follow up.   Specialty:  Infectious Diseases Contact information: [address] [address] [phone]        Faye Ramsay, MD Follow up.   Specialty:  Internal Medicine Why:  call my cell phone with questions [phone] Contact information: [place] [address] [phone]        CRAM,GARY P, MD Follow up in 2 day(s).   Specialty:  Neurosurgery Contact information: [address] [place] [address] [phone]        Elaina Hoops, MD Follow up.   Specialty:  Neurosurgery Contact information: [address] [place] [address] [phone]            The results of significant diagnostics from this hospitalization (including imaging, microbiology, ancillary and laboratory) are listed below for reference.    Significant Diagnostic Studies: Ct Pelvis Wo Contrast  Result Date: 05/21/2016 CLINICAL DATA:  Pain with a bump on the left lateral hip starting yesterday. Bump is increasing in size. No known injury. EXAM: CT PELVIS WITHOUT CONTRAST TECHNIQUE: Multidetector CT imaging of the pelvis was performed following the standard protocol without intravenous contrast. COMPARISON:  05/13/2016 FINDINGS: Urinary Tract: No bladder wall thickening or filling defect. Distal ureters are decompressed. Bowel: Visualized portions of the small bowel and colon are decompressed. No evidence of obstruction or inflammation. Scattered diverticula in the sigmoid colon. Vascular/Lymphatic: Mild calcification in the abdominal aorta and iliac vessels. Prominent retroperitoneal and pelvic lymphadenopathy. This is consistent with patient history of HIV and chronic lymphocytic leukemia. Similar appearance to prior study. Reproductive: Prostate gland is not enlarged. Small scrotal hydrocele  is noted. Other:  No free air or free fluid in the pelvis. Musculoskeletal: There is a hematoma posterior and lateral to the inter trochanteric region of the left hip. The hematoma measures about 5.3 x 9.5 x 9.1 cm. This finding is new since previous study. No evidence of acute fracture or dislocation in the pelvis, sacrum, or hips. Degenerative changes in the lower lumbar spine and in the hips. Postoperative change in the lower lumbar spine. IMPRESSION: New development of a hematoma adjacent to the left hip. No evidence of acute fracture or dislocation. Extensive lymphadenopathy in the retroperitoneum and pelvis is again demonstrated without significant change. A small scrotal hydrocele is noted. Electronically Signed   By: Lucienne Capers M.D.   On: 05/21/2016 21:56   Mr Thoracic Spine Wo Contrast  Result Date: 05/20/2016 CLINICAL DATA:  Long-standing history of chronic low back pain he's been fused from L2 down to the sacrum. Severe back pain radiating from both hips to the legs. EXAM: MRI THORACIC SPINE WITHOUT CONTRAST TECHNIQUE: Multiplanar, multisequence MR imaging of the thoracic spine was performed. No intravenous contrast was administered. COMPARISON:  CT chest 05/19/2013, CT chest 05/11/2015, CT abdomen/pelvis 05/13/2016, MR lumbar spine 05/14/2016 FINDINGS: Alignment: No static listhesis. Dextrocurvature of the thoracic spine. Kyphosis centered at L1-2. Vertebrae: No fracture, evidence of discitis, or bone lesion. Cord:  Normal signal and morphology. Paraspinal and other soft tissues: Retroperitoneal lymphadenopathy is partially visualized. There is thickening of the right diaphragmatic crux and mild edema with low signal areas within the thickened diaphragmatic crux. Disc levels: Disc spaces: Degenerative disc disease with disc height loss at C6-7. Mild degenerative disc disease at T11-12. Disc spaces are relatively well maintained otherwise. T1-T2: No significant disc protrusion, foraminal stenosis  or central canal stenosis. T2-T3: No significant disc protrusion, foraminal stenosis or central  canal stenosis. T3-T4: No significant disc protrusion, foraminal stenosis or central canal stenosis. T4-T5: No significant disc protrusion, foraminal stenosis or central canal stenosis. T5-T6: No significant disc protrusion, foraminal stenosis or central canal stenosis. T6-T7: No significant disc protrusion, foraminal stenosis or central canal stenosis. T7-T8: No significant disc protrusion, foraminal stenosis or central canal stenosis. T8-T9: No significant disc protrusion, foraminal stenosis or central canal stenosis. T9-T10: No significant disc protrusion, foraminal stenosis or central canal stenosis. T10-T11: No significant disc protrusion, foraminal stenosis or central canal stenosis. T11-T12: No significant disc protrusion, foraminal stenosis or central canal stenosis. L1-2 is partially visualized on the sagittal images and was better evaluated on recent MRI of the lumbar spine dated 05/15/2015. There are extensive degenerative changes at L1-2 with fluid in the disc space. Given the lack of surrounding marrow edema and disc fusion above and below this level, this likely reflects chronic advanced changes secondary to abnormal motion at this level. IMPRESSION: 1. No acute osseous injury of the thoracic spine. 2. No thoracic spine disc protrusion, foraminal stenosis or central canal stenosis. 3. L1-2 is partially visualized on the sagittal images and was better evaluated on recent MRI of the lumbar spine dated 05/15/2015. 4. Partially visualized is retroperitoneal lymphadenopathy. 5. There is thickening of the right diaphragmatic crux and mild edema with low signal areas within the thickened diaphragmatic crux. This may be secondary to an infectious or inflammatory process. Correlate with laboratory values. If the patient is able to receive intravenous contrast, recommend MRI of the thoracic spine with intravenous  contrast. Electronically Signed   By: Kathreen Devoid   On: 05/20/2016 11:14   Mr Lumbar Spine Wo Contrast  Result Date: 05/14/2016 CLINICAL DATA:  Severe low back pain and bilateral lower extremity weakness EXAM: MRI LUMBAR SPINE WITHOUT CONTRAST TECHNIQUE: Multiplanar, multisequence MR imaging of the lumbar spine was performed. No intravenous contrast was administered. COMPARISON:  Multiple prior CT examinations most recently yesterday. FINDINGS: Segmentation:  5 lumbar type vertebral bodies is in. Alignment: Curvature convex to the left the apex at L1-2. Kyphotic angulation at the L1-2 level. Vertebrae: There is fusion in the lower thoracic region to L1 with solid bridging osteophytes anteriorly. There is surgical fusion from L2 to the sacrum. L1-2 is the only mobile level. Conus medullaris: Extends to the L1-2 level and appears normal. Paraspinal and other soft tissues: Massive abdominal and pelvic lymphadenopathy as shown at CT. Disc levels: As noted above, there is fusion through the thoracic region to L1. The T11-12 and T12-L1 discs are normal. There is previous lumbosacral surgical fusion from L2 to the sacrum which appears solid. L1-2 is the only mobile level in there is pronounced degenerative change at this level because of that. The disc is degenerated and there are large anterior projecting osteophytes. There is fluid in the disc space that is probably degenerative in nature. There do not appear to be inflammatory changes of the vertebral bodies or surrounding soft tissues to suggest that this is infectious. Posterior annular fibers bulge only minimally. There is some facet degeneration and hypertrophy but this is fairly mild. There is mild canal narrowing at this level without visible neural compression. IMPRESSION: There are solid bridging osteophytes in the lower thoracic region through L1 resulting in functional fusion. There is postsurgical fusion from L2 to the sacrum. L1-2 is the only mobile  level and there is pronounced degenerative spondylosis at this level with a somewhat unusual pattern of large anterior projecting osteophytes and fluid intensity material  in the disc space. I think these changes are quite probably degenerative and I do not see any finding to suggest inflammatory spondyloarthropathy. Luckily, posterior annular fibers bulge only mildly and there is only mild hypertrophic facet change, therefore with only mild canal narrowing. Electronically Signed   By: Nelson Chimes M.D.   On: 05/14/2016 08:47   US Scrotum  Result Date: 05/16/2016 CLINICAL DATA:  Right scrotal pain and swelling EXAM: SCROTAL ULTRASOUND DOPPLER ULTRASOUND OF THE TESTICLES TECHNIQUE: Complete ultrasound examination of the testicles, epididymis, and other scrotal structures was performed. Color and spectral Doppler ultrasound were also utilized to evaluate blood flow to the testicles. COMPARISON:  August 17, 2014 FINDINGS: Right testicle Measurements: 4.4 x 2.9 x 3.3 cm. No mass or microlithiasis visualized. Left testicle Measurements: 3.7 x 2.1 x 3.6 cm. No mass or microlithiasis visualized. Right epididymis: The right epididymis appears somewhat edematous and hyperemic. There are cysts in the head of the epididymis, largest measuring 9 x 7 mm. Left epididymis:  Normal in size and appearance. Hydrocele: There is a fairly large right hydrocele containing mild debris. There is a much smaller hydrocele on the left. Varicocele:  None visualized. Pulsed Doppler interrogation of both testes demonstrates normal low resistance arterial and venous waveforms bilaterally. There is calcification along the wall of the left scrotal sac without wall thickening. There is no scrotal abscess on either side. IMPRESSION: Evidence of a degree of epididymitis on the right. Several small cysts noted in the right epididymis. No intratesticular mass or inflammatory focus. No testicular torsion. Evidence of prior inflammation in the left  scrotal sac with calcification along the left scrotal sac. There is again noted a fairly large hydrocele on the right. Mild debris in hydrocele on the right is probably due to the epididymitis. There is a much smaller left hydrocele. Electronically Signed   By: Lowella Grip III M.D.   On: 05/16/2016 21:30   Ct Abdomen Pelvis W Contrast  Result Date: 05/13/2016 CLINICAL DATA:  Pt c/o left lower abd pain that radiates down left buttocks, nausea/vomiting Since Friday EXAM: CT ABDOMEN AND PELVIS WITH CONTRAST TECHNIQUE: Multidetector CT imaging of the abdomen and pelvis was performed using the standard protocol following bolus administration of intravenous contrast. CONTRAST:  15mL ISOVUE-300 IOPAMIDOL (ISOVUE-300) INJECTION 61% COMPARISON:  11/06/2015 FINDINGS: Lower chest: Lung base subsegmental atelectasis and/ or scarring. 5 mm nodule in the costophrenic sulcus at the lateral left lower lobe lung base. Calcified nodes along the inferior right hilum. Hepatobiliary: Multiple calcifications consistent healed granuloma. No liver masses. Gallbladder surgically absent. No bile duct dilation Pancreas: Partial fatty replacement.  No mass or inflammation. Spleen: Mildly enlarged with multiple calcifications. Spleen measures 13.5 cm greatest dimension. It is stable from the prior study. Adrenals/Urinary Tract: No adrenal masses. No renal masses, no renal masses. Small nonobstructing stone in the lower pole the left kidney. Small nonobstructing stone in the upper pole the right kidney. No hydronephrosis. Ureters are normal course and in caliber. Bladder is unremarkable. Stomach/Bowel: Small hiatal hernia. No bowel obstruction. No bowel wall thickening. There are scattered colonic diverticula. Colon otherwise unremarkable. Appendix not visualized. Vascular/Lymphatic: There is bulky adenopathy. There are numerous enlarged retroperitoneal lymph nodes. Several reference measurements were made. There is in nodes the left  of the aorta at the level of the lower pole the left kidney. It measures 2.6 cm in short axis, 2.1 cm previously. An aortocaval node measures 18 mm in short axis, previously 13 mm. Multiple other nodes appear  larger than on the prior study. There is milder periceliac adenopathy largest node measuring 14 mm in short axis, previously 10 mm. There are also enlarged pelvic nodes, largest a left external iliac chain node measuring 18 mm in short axis unchanged from the prior study allowing for differences in measurement technique. A left common iliac chain node previously measured 11 mm, currently 13 mm. There are atherosclerotic calcifications along a normal caliber abdominal aorta. Reproductive: Unremarkable Other: No abdominal wall hernia.  No ascites. Musculoskeletal: Postsurgical changes from the L2 through S1 posterior lumbar spine fusion. Degenerative changes throughout the visualized spine. No osteoblastic or osteolytic lesions. IMPRESSION: 1. No acute findings. 2. Bulky adenopathy most evident along the retroperitoneum and in the pelvis. This has mildly increased when compared to the most recent prior study. Based on the prior CT scan history, patient has HIV and chronic lymphocytic leukemia. 3. Mild splenomegaly, stable. 4. Changes from healed granulomatous disease, stable. 5. Small nodule at the base of the left lower lobe not evident on the prior CTs. This measures 5 mm. Electronically Signed   By: Lajean Manes M.D.   On: 05/13/2016 16:57   [place] Art/ven Flow Abd Pelv Doppler  Result Date: 05/16/2016 CLINICAL DATA:  Right scrotal pain and swelling EXAM: SCROTAL ULTRASOUND DOPPLER ULTRASOUND OF THE TESTICLES TECHNIQUE: Complete ultrasound examination of the testicles, epididymis, and other scrotal structures was performed. Color and spectral Doppler ultrasound were also utilized to evaluate blood flow to the testicles. COMPARISON:  August 17, 2014 FINDINGS: Right testicle Measurements: 4.4 x 2.9 x 3.3 cm. No  mass or microlithiasis visualized. Left testicle Measurements: 3.7 x 2.1 x 3.6 cm. No mass or microlithiasis visualized. Right epididymis: The right epididymis appears somewhat edematous and hyperemic. There are cysts in the head of the epididymis, largest measuring 9 x 7 mm. Left epididymis:  Normal in size and appearance. Hydrocele: There is a fairly large right hydrocele containing mild debris. There is a much smaller hydrocele on the left. Varicocele:  None visualized. Pulsed Doppler interrogation of both testes demonstrates normal low resistance arterial and venous waveforms bilaterally. There is calcification along the wall of the left scrotal sac without wall thickening. There is no scrotal abscess on either side. IMPRESSION: Evidence of a degree of epididymitis on the right. Several small cysts noted in the right epididymis. No intratesticular mass or inflammatory focus. No testicular torsion. Evidence of prior inflammation in the left scrotal sac with calcification along the left scrotal sac. There is again noted a fairly large hydrocele on the right. Mild debris in hydrocele on the right is probably due to the epididymitis. There is a much smaller left hydrocele. Electronically Signed   By: Lowella Grip III M.D.   On: 05/16/2016 21:30   Ct L-spine No Charge  Result Date: 05/13/2016 CLINICAL DATA:  Back pain EXAM: CT LUMBAR SPINE WITHOUT CONTRAST TECHNIQUE: Multidetector CT imaging of the lumbar spine was performed without intravenous contrast administration. Multiplanar CT image reconstructions were also generated. COMPARISON:  CT abdomen pelvis 11/06/2015 FINDINGS: Segmentation: Normal segmentation.  Lowest disc space L5-S1 Alignment: Mild retrolisthesis L1-2. 21 degrees of levoscoliosis at L1-2 similar to the prior study. Vertebrae: Negative for fracture or mass. No evidence of spinal infection Paraspinal and other soft tissues: Paraspinous muscles show symmetric atrophy. No fluid collection or  asymmetry. No retroperitoneal mass. Disc levels: T10-11: Mild ligamentum flavum calcification without significant spinal stenosis T12-L1:  Negative L1-2: Disc and facet degeneration. Diffuse disc bulging with moderate spinal stenosis.  This is just above the fusion level. L2-3: Left L1 pedicle screw is been removed. Right L1 pedicle screw in good position. Bilateral L2 pedicle screws in good position. Interbody spacer in satisfactory position. Posterior decompression without significant stenosis. L3-4: Bilateral pedicle screw and interbody fusion. Posterior decompression without stenosis L4-5: Bilateral pedicle screw and posterior bony fusion. Bilateral laminectomy. No significant spinal stenosis L5-S1: Bilateral pedicle screw fusion. Right S1 screw crosses through the subarticular zone and could cause impingement or injury to the right S1 nerve root. This is unchanged from the CT of 11/06/2015. No significant spinal stenosis. IMPRESSION: Negative for fracture.  Moderate levoscoliosis. Surgical fusion L2 through S1 similar to prior studies. Right S1 screw passes through the subarticular zone in could cause injury to the right S1 nerve root. This is unchanged. No significant spinal stenosis. No evidence of mass or spinal infection. Electronically Signed   By: Franchot Gallo M.D.   On: 05/13/2016 16:58   Dg Chest Port 1 View  Result Date: 05/20/2016 CLINICAL DATA:  Preop spine surgery.  Follow-up pneumonia. EXAM: PORTABLE CHEST 1 VIEW COMPARISON:  05/14/2016 FINDINGS: The patient is rotated to the right which limits assessment of the mediastinum. Sequelae of prior CABG are again identified. There is mild left basilar opacity, improved from the prior study. There is no evidence of new airspace consolidation, sizeable pleural effusion, or pneumothorax. A calcified granuloma is again seen in the right upper lobe No acute osseous abnormality is seen. IMPRESSION: Mild residual left basilar infiltrate, improved from  prior. Electronically Signed   By: Logan Bores M.D.   On: 05/20/2016 16:10   Dg Chest Port 1 View  Result Date: 05/14/2016 CLINICAL DATA:  Chest pain and shortness of breath EXAM: PORTABLE CHEST 1 VIEW COMPARISON:  Chest radiograph November 07, 2015 and chest CT January 24, 2016 FINDINGS: There is mild patchy infiltrate in the left base. Lungs elsewhere clear. Heart size and pulmonary vascularity are normal. No adenopathy. Patient is status post internal mammary bypass grafting. No adenopathy. No bone lesions. IMPRESSION: Smaller of infiltrate left base, most likely pneumonia. Lungs elsewhere clear. Followup PA and lateral chest radiographs recommended in 3-4 weeks following trial of antibiotic therapy to ensure resolution and exclude underlying malignancy. Electronically Signed   By: Lowella Grip III M.D.   On: 05/14/2016 21:05   Dg Abd 2 Views  Result Date: 05/15/2016 CLINICAL DATA:  Constipation. EXAM: ABDOMEN - 2 VIEW COMPARISON:  CT 05/14/2015. FINDINGS: Limited exam due to positioning. Soft tissue structures are unremarkable. Several loops of slightly dilated air-filled small bowel noted. Adynamic ileus and/or partial small bowel obstruction cannot be completely excluded. Follow-up abdominal series suggested . Colonic gas pattern is normal. Mild amount stool noted throughout colon . Surgical clips right upper quadrant. Surgical clips noted over the mid abdomen. Aortoiliac atherosclerotic vascular calcification. Prior lumbar spine fusion. IMPRESSION: 1. Several air-filled loops of slightly dilated small bowel noted. Adynamic ileus or partial small bowel obstruction cannot be completely excluded. Colonic gas pattern is normal with mild amount of stool noted throughout colon. No free air. Follow-up abdominal series suggested for further evaluation. 2. Aortoiliac atherosclerotic vascular disease. Electronically Signed   By: Marcello Moores  Register   On: 05/15/2016 10:56   Dg Abd Portable 2v  Result Date:  05/17/2016 CLINICAL DATA:  67 year old male with abdominal and vomiting. Chronic severe abdominal pain. Patient uses narcotics daily. EXAM: PORTABLE ABDOMEN - 2 VIEW COMPARISON:  Prior abdominal radiograph 05/15/2016 FINDINGS: Incompletely imaged cardiomegaly. Patient is status  post median sternotomy with evidence of prior multivessel CABG. Low inspiratory volumes with nonspecific patchy right lower lobe airspace opacity which could reflect atelectasis or infiltrate. No evidence of free air on the up right images. Unchanged bowel gas pattern. Persisting gas within a minimally dilated loops small bowel in the right abdomen. Advanced multilevel degenerative disc disease with levoconvex scoliosis and surgical changes of prior posterior lumbar interbody fusion. IMPRESSION: 1. Unchanged bowel gas pattern with gas in multiple loops of minimally dilated small bowel in the right mid abdomen. Ileus versus partial small bowel obstruction. 2. Low inspiratory volumes with nonspecific right basilar patchy airspace opacity which may reflect atelectasis or infiltrate. Electronically Signed   By: Jacqulynn Cadet M.D.   On: 05/17/2016 11:03    Microbiology: Recent Results (from the past 240 hour(s))  Respiratory Panel by PCR     Status: None   Collection Time: 05/17/16 11:39 AM  Result Value Ref Range Status   Adenovirus NOT DETECTED NOT DETECTED Final   Coronavirus 229E NOT DETECTED NOT DETECTED Final   Coronavirus HKU1 NOT DETECTED NOT DETECTED Final   Coronavirus NL63 NOT DETECTED NOT DETECTED Final   Coronavirus OC43 NOT DETECTED NOT DETECTED Final   Metapneumovirus NOT DETECTED NOT DETECTED Final   Rhinovirus / Enterovirus NOT DETECTED NOT DETECTED Final   Influenza A NOT DETECTED NOT DETECTED Final   Influenza B NOT DETECTED NOT DETECTED Final   Parainfluenza Virus 1 NOT DETECTED NOT DETECTED Final   Parainfluenza Virus 2 NOT DETECTED NOT DETECTED Final   Parainfluenza Virus 3 NOT DETECTED NOT DETECTED  Final   Parainfluenza Virus 4 NOT DETECTED NOT DETECTED Final   Respiratory Syncytial Virus NOT DETECTED NOT DETECTED Final   Bordetella pertussis NOT DETECTED NOT DETECTED Final   Chlamydophila pneumoniae NOT DETECTED NOT DETECTED Final   Mycoplasma pneumoniae NOT DETECTED NOT DETECTED Final     Labs: Basic Metabolic Panel:  Recent Labs Lab 05/18/16 0402 05/19/16 0817 05/20/16 0836 05/21/16 0531 05/23/16 0547  NA 131* 132* 132* 132* 135  K 3.8 3.4* 3.9 4.2 4.0  CL 96* 95* 97* 96* 98*  CO2 28 29 28 29 28   GLUCOSE 96 115* 117* 91 93  BUN 9 6 6 8 11   CREATININE 0.81 0.86 0.80 0.82 0.92  CALCIUM 8.0* 8.3* 8.6* 8.8* 8.7*   Liver Function Tests: No results for input(s): AST, ALT, ALKPHOS, BILITOT, PROT, ALBUMIN in the last 168 hours. No results for input(s): LIPASE, AMYLASE in the last 168 hours. No results for input(s): AMMONIA in the last 168 hours. CBC:  Recent Labs Lab 05/18/16 0402 05/19/16 0817 05/20/16 0836 05/21/16 0531 05/23/16 0547  WBC 10.4 9.2 9.8 9.5 12.3*  HGB 11.0* 12.5* 12.4* 12.0* 11.5*  HCT 33.0* 37.6* 37.5* 37.1* 35.6*  MCV 84.6 83.9 84.7 86.5 84.8  PLT 150 185 190 206 264   Cardiac Enzymes: No results for input(s): CKTOTAL, CKMB, CKMBINDEX, TROPONINI in the last 168 hours. BNP: BNP (last 3 results) No results for input(s): BNP in the last 8760 hours.  ProBNP (last 3 results) No results for input(s): PROBNP in the last 8760 hours.  CBG:  Recent Labs Lab 05/21/16 0809 05/22/16 0645 05/24/16 0627  GLUCAP 89 91 93

## 2016-05-26 ENCOUNTER — Telehealth: Payer: Self-pay

## 2016-05-26 MED ORDER — ONDANSETRON 4 MG PO TBDP: 4.0000 mg | ORAL_TABLET | Freq: Three times a day (TID) | ORAL | 0 refills | Status: DC | PRN

## 2016-05-26 MED ORDER — ONDANSETRON HCL 4 MG PO TABS: 4.0000 mg | ORAL_TABLET | Freq: Three times a day (TID) | ORAL | 0 refills | Status: DC | PRN

## 2016-05-26 NOTE — Telephone Encounter (Signed)
Yes, refills are okay

## 2016-05-26 NOTE — Telephone Encounter (Signed)
Pt is coming in on Monday for a hospital follow up and is rq refills for the zofran distengrating tablets and the reqular tabs.  Also requesting rf for the ROBAXIN.   Please advise at your convenience.   FYI: pt checked himself out of [place]. Pt stated that they did draw some blood this morning. Okay to contact for the results?

## 2016-05-26 NOTE — Telephone Encounter (Signed)
Called [phone] [facility]) to rq lab results.  They will fax results to [place] as soon as they have them.

## 2016-05-26 NOTE — Telephone Encounter (Signed)
Transition Care Management Follow-up Telephone Call   Date discharged? 05/24/2016   How have you been since you were released from the hospital? Pt is feeling better. He has been taking abx as prescribed. Originally sent to [facility] but checked himself out of there today.    Do you understand why you were in the hospital? Pt understands.    Do you understand the discharge instructions? Pt stated that he understands the DC instructions.    Where were you discharged to? SNF but it now home.    Items Reviewed:  Medications reviewed: Yes  Allergies reviewed: Yes  Dietary changes reviewed: No change  Referrals reviewed: n/a other than SNF.   Functional Questionnaire:   Activities of Daily Living (ADLs):   He states they are independent in the following: Independent in all ADLs but needs assistive device to walk.  States they require assistance with the following: N/a   Any transportation issues/concerns?: Not at this time   Any patient concerns? Not at this time   Confirmed importance and date/time of follow-up visits scheduled Yes  Provider Appointment booked with PCP on 06/02/2016 at 10:30am.   Confirmed with patient if condition begins to worsen call PCP or go to the ER.  Patient was given the office number and encouraged to call back with question or concerns: Yes and pt stated understanding.

## 2016-05-27 ENCOUNTER — Other Ambulatory Visit: Payer: Self-pay | Admitting: Internal Medicine

## 2016-05-27 ENCOUNTER — Encounter: Payer: Self-pay | Admitting: Internal Medicine

## 2016-05-27 MED ORDER — METHOCARBAMOL 500 MG PO TABS: 500.0000 mg | ORAL_TABLET | Freq: Three times a day (TID) | ORAL | 4 refills | Status: DC | PRN

## 2016-06-02 ENCOUNTER — Encounter: Payer: Self-pay | Admitting: Internal Medicine

## 2016-06-02 ENCOUNTER — Ambulatory Visit (INDEPENDENT_AMBULATORY_CARE_PROVIDER_SITE_OTHER): Payer: Medicare Other | Admitting: Internal Medicine

## 2016-06-02 VITALS — BP 104/78 | HR 92 | Temp 97.8°F | Resp 16 | Ht 65.0 in | Wt 189.8 lb

## 2016-06-02 DIAGNOSIS — M05731 Rheumatoid arthritis with rheumatoid factor of right wrist without organ or systems involvement: Secondary | ICD-10-CM | POA: Diagnosis not present

## 2016-06-02 DIAGNOSIS — M5440 Lumbago with sciatica, unspecified side: Secondary | ICD-10-CM

## 2016-06-02 DIAGNOSIS — M549 Dorsalgia, unspecified: Secondary | ICD-10-CM | POA: Diagnosis not present

## 2016-06-02 DIAGNOSIS — I10 Essential (primary) hypertension: Secondary | ICD-10-CM

## 2016-06-02 DIAGNOSIS — G8929 Other chronic pain: Secondary | ICD-10-CM

## 2016-06-02 DIAGNOSIS — R2681 Unsteadiness on feet: Secondary | ICD-10-CM

## 2016-06-02 MED ORDER — HYDROMORPHONE HCL 2 MG PO TABS: 2.0000 mg | ORAL_TABLET | Freq: Four times a day (QID) | ORAL | 0 refills | Status: DC | PRN

## 2016-06-02 NOTE — Patient Instructions (Signed)

## 2016-06-02 NOTE — Progress Notes (Signed)
Pre visit review using our clinic review tool, if applicable. No additional management support is needed unless otherwise documented below in the visit note. 

## 2016-06-02 NOTE — Progress Notes (Signed)
Subjective:  Patient ID: [name], male    DOB: 06/28/1950  Age: 66 y.o. MRN: [id number]  CC: Hypertension and Back Pain   HPI [name] presents for a BP check and f/up on chronic, worsening LBP that radiates into his hips. He was recently admitted for pain control and got relief with dilaudid but he was not given an Rx for outpt, instead he has been taking ibuprofen but he knows that this is not safe considering his other meds.  Outpatient Medications Prior to Visit  Medication Sig Dispense Refill  . acetaminophen (TYLENOL) 325 MG tablet Take 2 tablets (650 mg total) by mouth every 6 (six) hours as needed for mild pain (or Fever >/= 101). 30 tablet 0  . aspirin EC 81 MG tablet Take 81 mg by mouth daily.    [name] atorvastatin (LIPITOR) 10 MG tablet TAKE 1 TABLET (10 MG TOTAL) BY MOUTH AT BEDTIME. 90 tablet 2  . Certolizumab Pegol (CIMZIA [place]) Inject into the skin. Pt unsure of dose. Every four weeks.    . ciclopirox (LOPROX) 0.77 % SUSP Apply 1 Act topically 2 (two) times daily. 60 mL 5  . escitalopram (LEXAPRO) 10 MG tablet Take 1 tablet (10 mg total) by mouth daily. 90 tablet 3  . fluticasone (FLONASE) 50 MCG/ACT nasal spray Place 2 sprays into both nostrils daily. 15 g 2  . furosemide (LASIX) 20 MG tablet TAKE 1 TABLET (20 MG TOTAL) BY MOUTH 2 (TWO) TIMES DAILY. 60 tablet 5  . GENVOYA 150-150-200-10 MG TABS tablet TAKE 1 TABLET BY MOUTH DAILY WITH BREAKFAST. 30 tablet 5  . loratadine (CLARITIN) 10 MG tablet TAKE 1 TABLET (10 MG TOTAL) BY MOUTH DAILY. 90 tablet 3  . methocarbamol (ROBAXIN) 500 MG tablet Take 1 tablet (500 mg total) by mouth every 8 (eight) hours as needed for muscle spasms. 75 tablet 4  . metoprolol succinate (TOPROL-XL) 25 MG 24 hr tablet Take 1 tablet (25 mg total) by mouth daily. Please schedule appointment for refills. 30 tablet 6  . ondansetron (ZOFRAN ODT) 4 MG disintegrating tablet Take 1 tablet (4 mg total) by mouth every 8 (eight) hours as needed for  nausea or vomiting. 20 tablet 0  . ondansetron (ZOFRAN) 4 MG tablet Take 1 tablet (4 mg total) by mouth every 8 (eight) hours as needed for nausea or vomiting. 20 tablet 0  . pantoprazole (PROTONIX) 40 MG tablet Take 1 tablet (40 mg total) by mouth 2 (two) times daily before a meal. (Patient taking differently: Take 40 mg by mouth every evening. ) 60 tablet 3  . polyethylene glycol (MIRALAX / GLYCOLAX) packet Take 17 g by mouth daily as needed for mild constipation. 14 each 0  . predniSONE (DELTASONE) 5 MG tablet Take 5 mg by mouth daily with breakfast.     . pregabalin (LYRICA) 200 MG capsule Take 1 capsule (200 mg total) by mouth 2 (two) times daily. 60 capsule 1  . PREZISTA 800 MG tablet TAKE 1 TABLET (800 MG TOTAL) BY MOUTH DAILY. 30 tablet 5  . saccharomyces boulardii (FLORASTOR) 250 MG capsule Take 1 capsule (250 mg total) by mouth 2 (two) times daily. 60 capsule 1  . sulfamethoxazole-trimethoprim (BACTRIM DS,SEPTRA DS) 800-160 MG tablet Take 1 tablet by mouth every 12 (twelve) hours. 20 tablet 0  . valACYclovir (VALTREX) 500 MG tablet TAKE 1 TABLET BY MOUTH EVERY DAY 90 tablet 3   No facility-administered medications prior to visit.     ROS  Review of Systems  Constitutional: Negative for appetite change, chills, fatigue, fever and unexpected weight change.  HENT: Negative.  Negative for trouble swallowing.   Eyes: Negative.  Negative for visual disturbance.  Respiratory: Negative for cough, chest tightness, shortness of breath, wheezing and stridor.   Cardiovascular: Negative for chest pain, palpitations and leg swelling.  Gastrointestinal: Negative for abdominal pain, constipation, diarrhea, nausea and vomiting.  Endocrine: Negative.   Genitourinary: Negative.   Musculoskeletal: Positive for back pain. Negative for myalgias and neck pain.  Skin: Negative.   Allergic/Immunologic: Negative.   Neurological: Negative.  Negative for dizziness, weakness and light-headedness.    Hematological: Negative for adenopathy. Does not bruise/bleed easily.  Psychiatric/Behavioral: Negative.     Objective:  BP 104/78 (BP Location: Left Arm, Patient Position: Sitting, Cuff Size: Normal)   Pulse 92   Temp 97.8 F (36.6 C) (Oral)   Resp 16   Ht 5\' 5"  (1.651 m)   Wt 189 lb 12 oz (86.1 kg)   SpO2 92%   BMI 31.58 kg/m   BP Readings from Last 3 Encounters:  06/02/16 104/78  05/24/16 (!) 111/57  03/27/16 116/80    Wt Readings from Last 3 Encounters:  06/02/16 189 lb 12 oz (86.1 kg)  05/19/16 121 lb 7.6 oz (55.1 kg)  03/27/16 201 lb 4 oz (91.3 kg)    Physical Exam  Constitutional: He is oriented to person, place, and time. No distress.  HENT:  Mouth/Throat: Oropharynx is clear and moist. No oropharyngeal exudate.  Eyes: Conjunctivae are normal. Right eye exhibits no discharge. Left eye exhibits no discharge. No scleral icterus.  Neck: Normal range of motion. Neck supple. No JVD present. No tracheal deviation present.  Cardiovascular: Normal rate, regular rhythm, normal heart sounds and intact distal pulses.  Exam reveals no gallop and no friction rub.   No murmur heard. Pulmonary/Chest: Effort normal and breath sounds normal. No stridor. No respiratory distress. He has no wheezes. He has no rales. He exhibits no tenderness.  Abdominal: Soft. Bowel sounds are normal. He exhibits no distension and no mass. There is no tenderness. There is no rebound and no guarding.  Musculoskeletal: Normal range of motion. He exhibits no edema or tenderness.       Thoracic back: He exhibits deformity. He exhibits no tenderness, no bony tenderness and no pain.       Lumbar back: He exhibits deformity. He exhibits no tenderness, no bony tenderness and no pain.  Lymphadenopathy:    He has no cervical adenopathy.  Neurological: He is oriented to person, place, and time. No cranial nerve deficit or sensory deficit. Coordination and gait normal.  Neg SLR in BLE  Skin: Skin is warm and  dry. No rash noted. He is not diaphoretic. No erythema. No pallor.  Vitals reviewed.   Lab Results  Component Value Date   WBC 12.3 (H) 05/23/2016   HGB 11.5 (L) 05/23/2016   HCT 35.6 (L) 05/23/2016   PLT 264 05/23/2016   GLUCOSE 93 05/23/2016   CHOL 149 08/30/2015   TRIG 151 (H) 08/30/2015   HDL 29 (L) 08/30/2015   LDLCALC 90 08/30/2015   ALT 29 05/13/2016   AST 36 05/13/2016   NA 135 05/23/2016   K 4.0 05/23/2016   CL 98 (L) 05/23/2016   CREATININE 0.92 05/23/2016   BUN 11 05/23/2016   CO2 28 05/23/2016   TSH 0.60 07/11/2014   INR 2.92 (H) 07/13/2015   HGBA1C 5.2 12/18/2015    Mr Lumbar  Spine Wo Contrast  Result Date: 05/14/2016 CLINICAL DATA:  Severe low back pain and bilateral lower extremity weakness EXAM: MRI LUMBAR SPINE WITHOUT CONTRAST TECHNIQUE: Multiplanar, multisequence MR imaging of the lumbar spine was performed. No intravenous contrast was administered. COMPARISON:  Multiple prior CT examinations most recently yesterday. FINDINGS: Segmentation:  5 lumbar type vertebral bodies is in. Alignment: Curvature convex to the left the apex at L1-2. Kyphotic angulation at the L1-2 level. Vertebrae: There is fusion in the lower thoracic region to L1 with solid bridging osteophytes anteriorly. There is surgical fusion from L2 to the sacrum. L1-2 is the only mobile level. Conus medullaris: Extends to the L1-2 level and appears normal. Paraspinal and other soft tissues: Massive abdominal and pelvic lymphadenopathy as shown at CT. Disc levels: As noted above, there is fusion through the thoracic region to L1. The T11-12 and T12-L1 discs are normal. There is previous lumbosacral surgical fusion from L2 to the sacrum which appears solid. L1-2 is the only mobile level in there is pronounced degenerative change at this level because of that. The disc is degenerated and there are large anterior projecting osteophytes. There is fluid in the disc space that is probably degenerative in nature.  There do not appear to be inflammatory changes of the vertebral bodies or surrounding soft tissues to suggest that this is infectious. Posterior annular fibers bulge only minimally. There is some facet degeneration and hypertrophy but this is fairly mild. There is mild canal narrowing at this level without visible neural compression. IMPRESSION: There are solid bridging osteophytes in the lower thoracic region through L1 resulting in functional fusion. There is postsurgical fusion from L2 to the sacrum. L1-2 is the only mobile level and there is pronounced degenerative spondylosis at this level with a somewhat unusual pattern of large anterior projecting osteophytes and fluid intensity material in the disc space. I think these changes are quite probably degenerative and I do not see any finding to suggest inflammatory spondyloarthropathy. Luckily, posterior annular fibers bulge only mildly and there is only mild hypertrophic facet change, therefore with only mild canal narrowing. Electronically Signed   By: Nelson Chimes M.D.   On: 05/14/2016 08:47   Ct Abdomen Pelvis W Contrast  Result Date: 05/13/2016 CLINICAL DATA:  Pt c/o left lower abd pain that radiates down left buttocks, nausea/vomiting Since Friday EXAM: CT ABDOMEN AND PELVIS WITH CONTRAST TECHNIQUE: Multidetector CT imaging of the abdomen and pelvis was performed using the standard protocol following bolus administration of intravenous contrast. CONTRAST:  126mL ISOVUE-300 IOPAMIDOL (ISOVUE-300) INJECTION 61% COMPARISON:  11/06/2015 FINDINGS: Lower chest: Lung base subsegmental atelectasis and/ or scarring. 5 mm nodule in the costophrenic sulcus at the lateral left lower lobe lung base. Calcified nodes along the inferior right hilum. Hepatobiliary: Multiple calcifications consistent healed granuloma. No liver masses. Gallbladder surgically absent. No bile duct dilation Pancreas: Partial fatty replacement.  No mass or inflammation. Spleen: Mildly enlarged  with multiple calcifications. Spleen measures 13.5 cm greatest dimension. It is stable from the prior study. Adrenals/Urinary Tract: No adrenal masses. No renal masses, no renal masses. Small nonobstructing stone in the lower pole the left kidney. Small nonobstructing stone in the upper pole the right kidney. No hydronephrosis. Ureters are normal course and in caliber. Bladder is unremarkable. Stomach/Bowel: Small hiatal hernia. No bowel obstruction. No bowel wall thickening. There are scattered colonic diverticula. Colon otherwise unremarkable. Appendix not visualized. Vascular/Lymphatic: There is bulky adenopathy. There are numerous enlarged retroperitoneal lymph nodes. Several reference measurements were made.  There is in nodes the left of the aorta at the level of the lower pole the left kidney. It measures 2.6 cm in short axis, 2.1 cm previously. An aortocaval node measures 18 mm in short axis, previously 13 mm. Multiple other nodes appear larger than on the prior study. There is milder periceliac adenopathy largest node measuring 14 mm in short axis, previously 10 mm. There are also enlarged pelvic nodes, largest a left external iliac chain node measuring 18 mm in short axis unchanged from the prior study allowing for differences in measurement technique. A left common iliac chain node previously measured 11 mm, currently 13 mm. There are atherosclerotic calcifications along a normal caliber abdominal aorta. Reproductive: Unremarkable Other: No abdominal wall hernia.  No ascites. Musculoskeletal: Postsurgical changes from the L2 through S1 posterior lumbar spine fusion. Degenerative changes throughout the visualized spine. No osteoblastic or osteolytic lesions. IMPRESSION: 1. No acute findings. 2. Bulky adenopathy most evident along the retroperitoneum and in the pelvis. This has mildly increased when compared to the most recent prior study. Based on the prior CT scan history, patient has HIV and chronic  lymphocytic leukemia. 3. Mild splenomegaly, stable. 4. Changes from healed granulomatous disease, stable. 5. Small nodule at the base of the left lower lobe not evident on the prior CTs. This measures 5 mm. Electronically Signed   By: Lajean Manes M.D.   On: 05/13/2016 16:57   Ct L-spine No Charge  Result Date: 05/13/2016 CLINICAL DATA:  Back pain EXAM: CT LUMBAR SPINE WITHOUT CONTRAST TECHNIQUE: Multidetector CT imaging of the lumbar spine was performed without intravenous contrast administration. Multiplanar CT image reconstructions were also generated. COMPARISON:  CT abdomen pelvis 11/06/2015 FINDINGS: Segmentation: Normal segmentation.  Lowest disc space L5-S1 Alignment: Mild retrolisthesis L1-2. 21 degrees of levoscoliosis at L1-2 similar to the prior study. Vertebrae: Negative for fracture or mass. No evidence of spinal infection Paraspinal and other soft tissues: Paraspinous muscles show symmetric atrophy. No fluid collection or asymmetry. No retroperitoneal mass. Disc levels: T10-11: Mild ligamentum flavum calcification without significant spinal stenosis T12-L1:  Negative L1-2: Disc and facet degeneration. Diffuse disc bulging with moderate spinal stenosis. This is just above the fusion level. L2-3: Left L1 pedicle screw is been removed. Right L1 pedicle screw in good position. Bilateral L2 pedicle screws in good position. Interbody spacer in satisfactory position. Posterior decompression without significant stenosis. L3-4: Bilateral pedicle screw and interbody fusion. Posterior decompression without stenosis L4-5: Bilateral pedicle screw and posterior bony fusion. Bilateral laminectomy. No significant spinal stenosis L5-S1: Bilateral pedicle screw fusion. Right S1 screw crosses through the subarticular zone and could cause impingement or injury to the right S1 nerve root. This is unchanged from the CT of 11/06/2015. No significant spinal stenosis. IMPRESSION: Negative for fracture.  Moderate  levoscoliosis. Surgical fusion L2 through S1 similar to prior studies. Right S1 screw passes through the subarticular zone in could cause injury to the right S1 nerve root. This is unchanged. No significant spinal stenosis. No evidence of mass or spinal infection. Electronically Signed   By: Franchot Gallo M.D.   On: 05/13/2016 16:58   Dg Chest Port 1 View  Result Date: 05/14/2016 CLINICAL DATA:  Chest pain and shortness of breath EXAM: PORTABLE CHEST 1 VIEW COMPARISON:  Chest radiograph November 07, 2015 and chest CT January 24, 2016 FINDINGS: There is mild patchy infiltrate in the left base. Lungs elsewhere clear. Heart size and pulmonary vascularity are normal. No adenopathy. Patient is status post internal mammary  bypass grafting. No adenopathy. No bone lesions. IMPRESSION: Smaller of infiltrate left base, most likely pneumonia. Lungs elsewhere clear. Followup PA and lateral chest radiographs recommended in 3-4 weeks following trial of antibiotic therapy to ensure resolution and exclude underlying malignancy. Electronically Signed   By: Lowella Grip III M.D.   On: 05/14/2016 21:05    Assessment & Plan:   Loranza was seen today for hypertension and back pain.  Diagnoses and all orders for this visit:  Essential hypertension- his BP is well controlled  Intractable back pain -     HYDROmorphone (DILAUDID) 2 MG tablet; Take 1 tablet (2 mg total) by mouth every 6 (six) hours as needed for severe pain. -     For home use only DME 4 wheeled rolling walker with seat [id number])  Chronic low back pain with sciatica, sciatica laterality unspecified, unspecified back pain laterality -     HYDROmorphone (DILAUDID) 2 MG tablet; Take 1 tablet (2 mg total) by mouth every 6 (six) hours as needed for severe pain. 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 Hagemann start on HYDROmorphone. I am also having him maintain his aspirin EC, saccharomyces boulardii, atorvastatin, PREZISTA, GENVOYA, ciclopirox, pantoprazole, predniSONE, Certolizumab Pegol (CIMZIA [place]), fluticasone, metoprolol succinate, loratadine, valACYclovir, escitalopram, pregabalin, furosemide, polyethylene glycol, sulfamethoxazole-trimethoprim, acetaminophen, ondansetron, ondansetron, and methocarbamol.  Meds ordered this encounter  Medications  . HYDROmorphone (DILAUDID) 2 MG tablet    Sig: Take 1 tablet (2 mg total) by mouth every 6 (six) hours as needed for severe pain.    Dispense:  100 tablet    Refill:  0     Follow-up: Return if symptoms worsen or fail to improve.  [name], MD

## 2016-06-03 ENCOUNTER — Telehealth: Payer: Self-pay | Admitting: Oncology

## 2016-06-03 ENCOUNTER — Other Ambulatory Visit: Payer: Self-pay | Admitting: *Deleted

## 2016-06-03 DIAGNOSIS — M5137 Other intervertebral disc degeneration, lumbosacral region: Secondary | ICD-10-CM | POA: Diagnosis not present

## 2016-06-03 NOTE — Telephone Encounter (Signed)
Pt called to cxl 1/25 appt due to pt having surgery. Pt will call back to r/s appt once well

## 2016-06-03 NOTE — Patient Outreach (Signed)
[facility]) Care Management  06/03/2016  [name] Jan 14, 1951 [id number]   Attempted to meet with patient at [facility] to review [facility] care management program services.  Was told that patient discharged AMA, unable to meet with patient.  Plan to sign off case.   Royetta Crochet. Laymond Purser, RN, BSN, [place] [phone]) Business Cell  [phone]) Toll Free Office

## 2016-06-05 ENCOUNTER — Ambulatory Visit: Payer: Self-pay | Admitting: Oncology

## 2016-06-05 ENCOUNTER — Other Ambulatory Visit: Payer: Self-pay

## 2016-06-08 ENCOUNTER — Other Ambulatory Visit: Payer: Self-pay | Admitting: Cardiology

## 2016-06-10 ENCOUNTER — Encounter: Payer: Self-pay | Admitting: Internal Medicine

## 2016-06-13 ENCOUNTER — Telehealth: Payer: Self-pay

## 2016-06-13 ENCOUNTER — Other Ambulatory Visit: Payer: Self-pay | Admitting: Internal Medicine

## 2016-06-13 DIAGNOSIS — I82403 Acute embolism and thrombosis of unspecified deep veins of lower extremity, bilateral: Secondary | ICD-10-CM

## 2016-06-13 MED ORDER — EDOXABAN TOSYLATE 30 MG PO TABS: 30.0000 mg | ORAL_TABLET | ORAL | 1 refills | Status: DC

## 2016-06-13 NOTE — Telephone Encounter (Signed)
Savaysa was dc'ed. Do you want him to continue this medicaiton? Please advise.

## 2016-06-13 NOTE — Telephone Encounter (Signed)
Received message from PCP to do PA for savaysa. This medication was dc'ed after last hospital admission.  Awaiting response to proceed or disregard the request to do PA.

## 2016-06-13 NOTE — Telephone Encounter (Signed)
PA contact number [phone] (this is the pharmacy number - do not use) ID: [id number] for PA's Spoke to representative Bryson Corona): PA initiated  PA approved Faxed information to pharmacy.  Sent pt a Therapist, music.

## 2016-06-14 ENCOUNTER — Other Ambulatory Visit: Payer: Self-pay | Admitting: Internal Medicine

## 2016-06-14 DIAGNOSIS — B59 Pneumocystosis: Secondary | ICD-10-CM

## 2016-06-23 ENCOUNTER — Other Ambulatory Visit: Payer: Self-pay | Admitting: Infectious Diseases

## 2016-06-23 ENCOUNTER — Other Ambulatory Visit: Payer: Self-pay | Admitting: Neurosurgery

## 2016-06-23 DIAGNOSIS — B20 Human immunodeficiency virus [HIV] disease: Secondary | ICD-10-CM

## 2016-06-23 NOTE — Telephone Encounter (Signed)
Cassie please take a look at the refill on prezista and his nasal spray.

## 2016-06-24 ENCOUNTER — Ambulatory Visit: Payer: Self-pay | Admitting: Internal Medicine

## 2016-06-24 ENCOUNTER — Other Ambulatory Visit: Payer: Self-pay | Admitting: Pharmacist

## 2016-06-24 ENCOUNTER — Telehealth: Payer: Self-pay | Admitting: Pharmacist

## 2016-06-24 ENCOUNTER — Other Ambulatory Visit: Payer: Self-pay | Admitting: *Deleted

## 2016-06-24 DIAGNOSIS — B20 Human immunodeficiency virus [HIV] disease: Secondary | ICD-10-CM

## 2016-06-24 MED ORDER — ELVITEG-COBIC-EMTRICIT-TENOFAF 150-150-200-10 MG PO TABS: 1.0000 | ORAL_TABLET | Freq: Every day | ORAL | 5 refills | Status: DC

## 2016-06-24 NOTE — Telephone Encounter (Signed)
It came to my attention that [name] was on Genvoya and has been prescribed Flonase for nasal congestion.  Flonase is contraindicated in patient's on Genvoya due to the interaction that the Genvoya can cause increased concentrations of corticosteroids and lead to serious side effects.  Called [name] and told him not to take the Flonase anymore.  He also has Nasonex (mometasone) that he takes PRN. I told him to take that instead and to throw away the Flonase and not take it again as long as he is on Genvoya.  He understood.

## 2016-06-24 NOTE — Pre-Procedure Instructions (Addendum)
[name]  06/24/2016      [id number] - [place], [place] [address] [place] [address] Phone: [phone] Fax: [phone]    Your procedure is scheduled on Wednesday, February 21.  Report to [facility] Admitting at 10:20 AM                   Your surgery or procedure is scheduled for 12:20 PM               Call this number if you have problems the morning of surgery: [phone]                For any other questions, please call [phone], Monday - Friday 8 AM - 4 PM.   Remember:  Do not eat food or drink liquids after midnight Tuesday, February 20.  Take these medicines the morning of surgery with A SIP OF WATER : escitalopram (LEXAPRO), loratadine (CLARITIN), metoprolol succinate (TOPROL-XL),pregabalin (LYRICA),  pantoprazole (PROTONIX), PREZISTA.  Use Nasonex if needed.                      Take if needed: HYDROmorphone (DILAUDID)- and if you tolerate it on an empty stomach.                   Stop edoxaban (SAVAYSA) as instructed by Dr Saintclair Halsted.                     1week prior to surgery stop aspirin, advil, motrin, ibuprofen, aleve,BC Powders, Goody's vitamins/herbal medicines   [place]- Preparing For Surgery  Before surgery, you can play an important role. Because skin is not sterile, your skin needs to be as free of germs as possible. You can reduce the number of germs on your skin by washing with CHG (chlorahexidine gluconate) Soap before surgery.  CHG is an antiseptic cleaner which kills germs and bonds with the skin to continue killing germs even after washing.  Please do not use if you have an allergy to CHG or antibacterial soaps. If your skin becomes reddened/irritated stop using the CHG.  Do not shave (including legs and underarms) for at least 48 hours prior to first CHG shower. It is OK to shave your face.  Please follow these instructions carefully.   1. Shower the NIGHT  BEFORE SURGERY and the MORNING OF SURGERY with CHG.   2. If you chose to wash your hair, wash your hair first as usual with your normal shampoo.  3. After you shampoo, rinse your hair and body thoroughly to remove the shampoo.  4. Use CHG as you would any other liquid soap. You can apply CHG directly to the skin and wash gently with a scrungie or a clean washcloth.   5. Apply the CHG Soap to your body ONLY FROM THE NECK DOWN.  Do not use on open wounds or open sores. Avoid contact with your eyes, ears, mouth and genitals (private parts). Wash genitals (private parts) with your normal soap.  6. Wash thoroughly, paying special attention to the area where your surgery will be performed.  7. Thoroughly rinse your body with warm water from the neck down.  8. DO NOT shower/wash with your normal soap after using and rinsing off the CHG Soap.  9. Pat yourself dry with a CLEAN TOWEL.   10. Wear CLEAN PAJAMAS  11. Place CLEAN SHEETS on your bed the night of your first shower and DO NOT SLEEP WITH PETS.  Day of Surgery: Do not apply any deodorants/lotions. Please wear clean clothes to the hospital/surgery center.    Do not wear jewelry, make-up or nail polish.  Do not wear lotions, powders, or perfumes, or deodorant.   Men may shave face and neck.  Do not bring valuables to the hospital.  [facility] is not responsible for any belongings or valuables.  Contacts, dentures or bridgework may not be worn into surgery.  Leave your suitcase in the car.  After surgery it may be brought to your room.  For patients admitted to the hospital, discharge time will be determined by your treatment team.  Patients discharged the day of surgery will not be allowed to drive home.   Special instructions: -   Please read over the following fact sheets that you were given: Patient Instructions for Mupirocin Application, Incentive Spirometry, Pain Booklet

## 2016-06-24 NOTE — Telephone Encounter (Signed)
Thank you :)

## 2016-06-25 ENCOUNTER — Encounter (HOSPITAL_COMMUNITY): Payer: Self-pay

## 2016-06-25 ENCOUNTER — Encounter (HOSPITAL_COMMUNITY)
Admission: RE | Admit: 2016-06-25 | Discharge: 2016-06-25 | Disposition: A | Discharge status: Home / Self Care | Payer: Medicare Other | Source: Ambulatory Visit | Attending: Neurosurgery | Admitting: Neurosurgery

## 2016-06-25 DIAGNOSIS — Z01812 Encounter for preprocedural laboratory examination: Secondary | ICD-10-CM | POA: Insufficient documentation

## 2016-06-25 HISTORY — DX: Depression, unspecified: F32.A

## 2016-06-25 HISTORY — DX: Major depressive disorder, single episode, unspecified: F32.9

## 2016-06-25 LAB — PROTIME-INR
INR: 0.97
Prothrombin Time: 12.9 seconds (ref 11.4–15.2)

## 2016-06-25 LAB — CBC
HCT: 43.8 % (ref 39.0–52.0)
Hemoglobin: 13.9 g/dL (ref 13.0–17.0)
MCH: 28.3 pg (ref 26.0–34.0)
MCHC: 31.7 g/dL (ref 30.0–36.0)
MCV: 89.2 fL (ref 78.0–100.0)
Platelets: 153 10*3/uL (ref 150–400)
RBC: 4.91 MIL/uL (ref 4.22–5.81)
RDW: 17.6 % — ABNORMAL HIGH (ref 11.5–15.5)
WBC: 19.9 10*3/uL — ABNORMAL HIGH (ref 4.0–10.5)

## 2016-06-25 LAB — COMPREHENSIVE METABOLIC PANEL
ALT: 25 U/L (ref 17–63)
AST: 30 U/L (ref 15–41)
Albumin: 3.8 g/dL (ref 3.5–5.0)
Alkaline Phosphatase: 107 U/L (ref 38–126)
Anion gap: 11 (ref 5–15)
BUN: 13 mg/dL (ref 6–20)
CO2: 25 mmol/L (ref 22–32)
Calcium: 8.7 mg/dL — ABNORMAL LOW (ref 8.9–10.3)
Chloride: 104 mmol/L (ref 101–111)
Creatinine, Ser: 1.05 mg/dL (ref 0.61–1.24)
GFR calc Af Amer: >60 mL/min (ref >60)
GFR calc non Af Amer: >60 mL/min (ref >60)
Glucose, Bld: 115 mg/dL — ABNORMAL HIGH (ref 65–99)
Potassium: 3.8 mmol/L (ref 3.5–5.1)
Sodium: 140 mmol/L (ref 135–145)
Total Bilirubin: 0.9 mg/dL (ref 0.3–1.2)
Total Protein: 5.7 g/dL — ABNORMAL LOW (ref 6.5–8.1)

## 2016-06-25 LAB — TYPE AND SCREEN
ABO/RH(D): A POS
Antibody Screen: NEGATIVE

## 2016-06-25 LAB — SURGICAL PCR SCREEN
MRSA, PCR: NEGATIVE
Staphylococcus aureus: NEGATIVE

## 2016-06-25 NOTE — Progress Notes (Signed)
PCP: Dr. [name] Cardiologist: Dr. Lavonna Monarch Rhuematologist: Dr. Marijean Bravo

## 2016-06-26 ENCOUNTER — Other Ambulatory Visit: Payer: Self-pay

## 2016-06-26 ENCOUNTER — Other Ambulatory Visit: Payer: Self-pay | Admitting: Infectious Diseases

## 2016-06-26 DIAGNOSIS — M792 Neuralgia and neuritis, unspecified: Secondary | ICD-10-CM

## 2016-06-26 MED ORDER — PREGABALIN 200 MG PO CAPS: 200.0000 mg | ORAL_CAPSULE | Freq: Two times a day (BID) | ORAL | 1 refills | Status: DC

## 2016-06-26 NOTE — Telephone Encounter (Signed)
Script called to pharmacy.     Laverle Patter, RN

## 2016-06-26 NOTE — Telephone Encounter (Signed)
e

## 2016-06-26 NOTE — Progress Notes (Addendum)
Anesthesia chart review: Patient is 66 year old male scheduled for T10-11, T11-12, T12-L1, L1-2 posterior lateral fusion on 07/02/2016 by Dr. Saintclair Halsted.  History includes never smoker, HIV (diagnosed '93), CAD/anterior STEMI s/p BMS LAD 09/15/08 with CABG (LIMA-LAD, left RA-CX, right RA-RCA) 10/05/08, CVA '97, hiatal hernia s/p repair '08, gynecomastia, GERD, HTN, recurrent LLE DVT (on Savaysa), carotid artery occlusive disease (mild), RA, fibromyalgia, neuropathy, CLL '07, secondary syphilis s/p treatment '16, depression, spinal surgery 2010, cholecystectomy, TMJ surgery, varicose vein stripping, bilateral inguinal hernia repair, tonsillectomy, bilateral TKA. He was admitted for intractable acute on chronic back pain (Neurosurgery consulted, out-patient f/u), LLE cellulitis (s/p clindamycin), left hip hematoma (anticoagulation held), LLL PNA with incidental LLL lung nodule (s/p Zithromax-->Levquin; f/u nodule out-patient), right epidiymitis (s/p Bactrim) 05/13/16-06-09-16.   - PCP is Dr. [name] with [facility], last visit 06/02/16. - Rheumatologist is [name]. -Cardiologist is Dr. Percival Spanish, last visit 2015-06-10. No further cardiac testing recommended at that visit.  - ID is Dr. [name], last visit 02/18/16. Six month follow-up planned. - Pulmonologist is Dr. Chase Caller, last visit 01/24/16 for follow-up CAP and mild ILD (likely due to RA). Six month follow-up planned. - HEM-ONC is Dr. Zola Button, last visit 11/29/15. WBC 14,000 with lymphocytic percentage around 75%. Continued active surveillance with six month follow-up planned. - GI is Dr. Hilarie Fredrickson, last visit 01/03/16.  Meds include aspirin 81 mg (on hold), Lipitor, certolizumab pegol (for RA), Savaysa (on hold), Genvoya (for HIV), Prezista (for HIV), Lexapro, Lasix, hydromorphone, loratadine, Robaxin, Toprol-XL, Nasonex, Zofran, Protonix, prednisone, Bactrim DS, Valtrex.  BP 140/74   Pulse 71   Temp 36.5 C   Resp 18   Ht 5\' 7"  (1.702 m)    Wt 185 lb (83.9 kg)   SpO2 98%   BMI 28.98 kg/m   EKG 05/14/16: ST, nonspecific ST and T-wave abnormality. Rate is faster and non-specific ST/T wave abnormality is new/more pronounced when compared to 2015-06-10 tracing.  Nuclear stress test 08/16/14: Overall Impression:  Intermediate risk stress nuclear study with moderate-size and intensity, fixed apical/septal defect suggestive of scar. No reversible ischemia. LV Wall Motion:  Septal hypokinesis/dyskineis and apical akinesis, EF 50%.  Echo 08/16/14: Study Conclusions - Left ventricle: The cavity size was normal. Systolic function was normal. The estimated ejection fraction was in the range of 55% to 60%. There was no evidence of elevated ventricular filling pressure by Doppler parameters. - Aortic valve: There was mild regurgitation. - Left atrium: The atrium was moderately dilated. - Right ventricle: The cavity size was mildly dilated. - Right atrium: The atrium was mildly dilated. - Tricuspid valve: There was moderate regurgitation.  His last cardiac cath was in 2010 prior to his CABG.  Carotid ultrasound 05/28/11: Impression:  Mild intimal thickening, bilaterally. 0-39% bilateral ICA stenoses.  LLE venous Duplex 05/13/16: Summary: - No evidence of deep vein or superficial thrombosis involving the   left lower extremity and right common femoral vein. - Incidental findings of multiple heterogenous areas of the left   groin suggestive of prominent inguinal lymph nodes, the largest   of which measures 0.84 cm x 2.29 cm. - No evidence of Baker&'s cyst on the left.  1V CXR 05/20/16: IMPRESSION: Mild residual left basilar infiltrate, improved from prior.  CT pelvis 05/21/16: IMPRESSION: New development of a hematoma adjacent to the left hip. No evidence of acute fracture or dislocation. Extensive lymphadenopathy in the retroperitoneum and pelvis is again demonstrated without significant change. A small scrotal hydrocele is  noted.  PFTs  01/24/16: FVC 3.46 (93%), FEV1 2.68 (98%), DLCOunc 16.88 (65%).  Preoperative labs noted. Cr 1.05, AST/ALT 30/25. WBC 19.9 (previously 12.3 on 05/23/16). H/H 13.9/43.8. PLT 153. PT/INR 12.9/0.97. Glucose 115. T&S done.   Patient with worsening leukocytosis. Will attempt to get in touch with patient. Unclear if this is related to his CLL or a more acute process. Will see if he has taken steroids recently or if he is having any acute infectious symptoms (was treated for several infections during his hospitalization last month).  [facility] Short Stay Center/Anesthesiology Phone [phone] 06/26/2016 6:42 PM   Addendum: I called and spoke with patient. He denied any known acute infection. On the day of his PAT, he did have N/V associated with car sickness which resolved within 30 minutes of getting home. (He lives in [place], [place].). He also was treated for suspected right groin yeast infection last week, but reports rash is now resolved. He denied fever, cough, dysuria, further rash or N/V, night sweats. He denied CP and SOB. He has had approximately 15 lb weight loss over the past several months, but has had a decrease in his appetite with his pain and recent hospitalization. He is on chronic prednisone 5 mg daily, but no added steroids. Still unsure the source of his leukocytosis. He denied acute infectious symptoms, although with possible yeast infection last week which has reportedly cleared. Could also be related to CLL. I told patient that from an anesthesia standpoint, if he arrived acutely ill, or with active infection that surgery would likely be canceled. Currently, this does not seem to be the case by subjective data. Surgeon may also want leukocytosis either rechecked or further evaluated before surgery--will defer to surgeon. Anesthesiologist Dr. Smith Robert agrees with this plan. I have notified Lorriane Shire at Dr. Windy Carina office of above. She will review with him for  additional recommendations.   [facility] Short Stay Center/Anesthesiology Phone [phone] 06/27/2016 11:05 AM

## 2016-07-01 ENCOUNTER — Telehealth: Payer: Self-pay

## 2016-07-01 NOTE — Telephone Encounter (Signed)
Pt letter has been faxed.

## 2016-07-01 NOTE — Progress Notes (Signed)
Per Dr. Saintclair Halsted he request patient arrive at 9:30 patient notified.

## 2016-07-01 NOTE — Telephone Encounter (Signed)
Received fax from [facility].   Request for written medical judgement and medical clearance for T10 to L2 Posterior Lateral Fusion  On 07/02/2016.   Verbal okay to write letter stating moderate surgical risk but patient would greatly benefit from surgical intervention.   Letter typed and given to pcp to sign.

## 2016-07-02 ENCOUNTER — Inpatient Hospital Stay (HOSPITAL_COMMUNITY): Payer: Medicare Other | Admitting: Vascular Surgery

## 2016-07-02 ENCOUNTER — Inpatient Hospital Stay (HOSPITAL_COMMUNITY): Payer: Medicare Other

## 2016-07-02 ENCOUNTER — Encounter (HOSPITAL_COMMUNITY): Payer: Self-pay | Admitting: *Deleted

## 2016-07-02 ENCOUNTER — Encounter (HOSPITAL_COMMUNITY)
Admission: RE | Disposition: A | Discharge status: Skilled Nursing Facility | Payer: Self-pay | Source: Ambulatory Visit | Attending: Neurosurgery

## 2016-07-02 ENCOUNTER — Inpatient Hospital Stay (HOSPITAL_COMMUNITY): Payer: Medicare Other | Admitting: Certified Registered"

## 2016-07-02 ENCOUNTER — Inpatient Hospital Stay (HOSPITAL_COMMUNITY)
Admission: RE | Admit: 2016-07-02 | Discharge: 2016-07-07 | DRG: 459 | Disposition: A | Discharge status: Skilled Nursing Facility | Payer: Medicare Other | Source: Ambulatory Visit | Attending: Neurosurgery | Admitting: Neurosurgery

## 2016-07-02 DIAGNOSIS — R531 Weakness: Secondary | ICD-10-CM | POA: Diagnosis not present

## 2016-07-02 DIAGNOSIS — Z981 Arthrodesis status: Secondary | ICD-10-CM | POA: Diagnosis not present

## 2016-07-02 DIAGNOSIS — I25810 Atherosclerosis of coronary artery bypass graft(s) without angina pectoris: Secondary | ICD-10-CM | POA: Diagnosis not present

## 2016-07-02 DIAGNOSIS — G9741 Accidental puncture or laceration of dura during a procedure: Secondary | ICD-10-CM | POA: Diagnosis not present

## 2016-07-02 DIAGNOSIS — I2510 Atherosclerotic heart disease of native coronary artery without angina pectoris: Secondary | ICD-10-CM | POA: Diagnosis present

## 2016-07-02 DIAGNOSIS — K2210 Ulcer of esophagus without bleeding: Secondary | ICD-10-CM | POA: Diagnosis not present

## 2016-07-02 DIAGNOSIS — Z884 Allergy status to anesthetic agent status: Secondary | ICD-10-CM | POA: Diagnosis not present

## 2016-07-02 DIAGNOSIS — N189 Chronic kidney disease, unspecified: Secondary | ICD-10-CM | POA: Diagnosis present

## 2016-07-02 DIAGNOSIS — M4805 Spinal stenosis, thoracolumbar region: Secondary | ICD-10-CM | POA: Diagnosis not present

## 2016-07-02 DIAGNOSIS — M109 Gout, unspecified: Secondary | ICD-10-CM | POA: Diagnosis not present

## 2016-07-02 DIAGNOSIS — I129 Hypertensive chronic kidney disease with stage 1 through stage 4 chronic kidney disease, or unspecified chronic kidney disease: Secondary | ICD-10-CM | POA: Diagnosis present

## 2016-07-02 DIAGNOSIS — Z79899 Other long term (current) drug therapy: Secondary | ICD-10-CM

## 2016-07-02 DIAGNOSIS — K739 Chronic hepatitis, unspecified: Secondary | ICD-10-CM | POA: Diagnosis not present

## 2016-07-02 DIAGNOSIS — C9110 Chronic lymphocytic leukemia of B-cell type not having achieved remission: Secondary | ICD-10-CM | POA: Diagnosis not present

## 2016-07-02 DIAGNOSIS — Z9109 Other allergy status, other than to drugs and biological substances: Secondary | ICD-10-CM | POA: Diagnosis not present

## 2016-07-02 DIAGNOSIS — M5136 Other intervertebral disc degeneration, lumbar region: Secondary | ICD-10-CM | POA: Diagnosis not present

## 2016-07-02 DIAGNOSIS — Y658 Other specified misadventures during surgical and medical care: Secondary | ICD-10-CM | POA: Diagnosis present

## 2016-07-02 DIAGNOSIS — M069 Rheumatoid arthritis, unspecified: Secondary | ICD-10-CM | POA: Diagnosis present

## 2016-07-02 DIAGNOSIS — E785 Hyperlipidemia, unspecified: Secondary | ICD-10-CM | POA: Diagnosis present

## 2016-07-02 DIAGNOSIS — K219 Gastro-esophageal reflux disease without esophagitis: Secondary | ICD-10-CM | POA: Diagnosis present

## 2016-07-02 DIAGNOSIS — M5135 Other intervertebral disc degeneration, thoracolumbar region: Principal | ICD-10-CM | POA: Diagnosis present

## 2016-07-02 DIAGNOSIS — Z8673 Personal history of transient ischemic attack (TIA), and cerebral infarction without residual deficits: Secondary | ICD-10-CM

## 2016-07-02 DIAGNOSIS — Z91010 Allergy to peanuts: Secondary | ICD-10-CM

## 2016-07-02 DIAGNOSIS — M4325 Fusion of spine, thoracolumbar region: Secondary | ICD-10-CM | POA: Diagnosis not present

## 2016-07-02 DIAGNOSIS — Z7982 Long term (current) use of aspirin: Secondary | ICD-10-CM

## 2016-07-02 DIAGNOSIS — M48061 Spinal stenosis, lumbar region without neurogenic claudication: Secondary | ICD-10-CM | POA: Diagnosis present

## 2016-07-02 DIAGNOSIS — M5134 Other intervertebral disc degeneration, thoracic region: Secondary | ICD-10-CM | POA: Diagnosis not present

## 2016-07-02 DIAGNOSIS — B20 Human immunodeficiency virus [HIV] disease: Secondary | ICD-10-CM | POA: Diagnosis present

## 2016-07-02 DIAGNOSIS — R0602 Shortness of breath: Secondary | ICD-10-CM

## 2016-07-02 DIAGNOSIS — Y92239 Unspecified place in hospital as the place of occurrence of the external cause: Secondary | ICD-10-CM | POA: Diagnosis present

## 2016-07-02 DIAGNOSIS — I25708 Atherosclerosis of coronary artery bypass graft(s), unspecified, with other forms of angina pectoris: Secondary | ICD-10-CM | POA: Diagnosis not present

## 2016-07-02 DIAGNOSIS — G629 Polyneuropathy, unspecified: Secondary | ICD-10-CM | POA: Diagnosis not present

## 2016-07-02 DIAGNOSIS — M4804 Spinal stenosis, thoracic region: Secondary | ICD-10-CM | POA: Diagnosis not present

## 2016-07-02 DIAGNOSIS — F329 Major depressive disorder, single episode, unspecified: Secondary | ICD-10-CM | POA: Diagnosis present

## 2016-07-02 DIAGNOSIS — M549 Dorsalgia, unspecified: Secondary | ICD-10-CM | POA: Diagnosis not present

## 2016-07-02 DIAGNOSIS — M797 Fibromyalgia: Secondary | ICD-10-CM | POA: Diagnosis present

## 2016-07-02 DIAGNOSIS — M532X5 Spinal instabilities, thoracolumbar region: Secondary | ICD-10-CM | POA: Diagnosis not present

## 2016-07-02 DIAGNOSIS — Z86718 Personal history of other venous thrombosis and embolism: Secondary | ICD-10-CM

## 2016-07-02 DIAGNOSIS — R2689 Other abnormalities of gait and mobility: Secondary | ICD-10-CM | POA: Diagnosis not present

## 2016-07-02 DIAGNOSIS — R278 Other lack of coordination: Secondary | ICD-10-CM | POA: Diagnosis not present

## 2016-07-02 DIAGNOSIS — K449 Diaphragmatic hernia without obstruction or gangrene: Secondary | ICD-10-CM

## 2016-07-02 DIAGNOSIS — Z880 Allergy status to penicillin: Secondary | ICD-10-CM

## 2016-07-02 DIAGNOSIS — M6281 Muscle weakness (generalized): Secondary | ICD-10-CM | POA: Diagnosis not present

## 2016-07-02 DIAGNOSIS — Z419 Encounter for procedure for purposes other than remedying health state, unspecified: Secondary | ICD-10-CM

## 2016-07-02 DIAGNOSIS — M546 Pain in thoracic spine: Secondary | ICD-10-CM | POA: Diagnosis not present

## 2016-07-02 DIAGNOSIS — Z91018 Allergy to other foods: Secondary | ICD-10-CM | POA: Diagnosis not present

## 2016-07-02 DIAGNOSIS — I10 Essential (primary) hypertension: Secondary | ICD-10-CM | POA: Diagnosis present

## 2016-07-02 DIAGNOSIS — Z951 Presence of aortocoronary bypass graft: Secondary | ICD-10-CM

## 2016-07-02 DIAGNOSIS — Z96653 Presence of artificial knee joint, bilateral: Secondary | ICD-10-CM | POA: Diagnosis present

## 2016-07-02 DIAGNOSIS — D649 Anemia, unspecified: Secondary | ICD-10-CM | POA: Diagnosis not present

## 2016-07-02 DIAGNOSIS — I252 Old myocardial infarction: Secondary | ICD-10-CM

## 2016-07-02 DIAGNOSIS — D72829 Elevated white blood cell count, unspecified: Secondary | ICD-10-CM | POA: Diagnosis not present

## 2016-07-02 SURGERY — POSTERIOR LUMBAR FUSION 1 LEVEL
Anesthesia: General | Site: Back

## 2016-07-02 MED ORDER — SODIUM CHLORIDE 0.9% FLUSH: 3.0000 mL | INTRAVENOUS | Status: DC | PRN

## 2016-07-02 MED ORDER — ONDANSETRON 4 MG PO TBDP: 4.0000 mg | ORAL_TABLET | Freq: Three times a day (TID) | ORAL | Status: DC | PRN

## 2016-07-02 MED ORDER — HYDROMORPHONE HCL 2 MG PO TABS
2.0000 mg | ORAL_TABLET | Freq: Four times a day (QID) | ORAL | Status: DC | PRN
  Administered 2016-07-05 – 2016-07-07 (×4): 2 mg via ORAL
  Filled 2016-07-02 (×4): qty 1

## 2016-07-02 MED ORDER — OXYCODONE HCL 5 MG PO TABS
5.0000 mg | ORAL_TABLET | ORAL | Status: DC | PRN
  Administered 2016-07-03: 5 mg via ORAL
  Filled 2016-07-02: qty 1

## 2016-07-02 MED ORDER — MIDAZOLAM HCL 2 MG/2ML IJ SOLN
INTRAMUSCULAR | Status: DC | PRN
Start: 2016-07-02 — End: 2016-07-02
  Administered 2016-07-02: 2 mg via INTRAVENOUS

## 2016-07-02 MED ORDER — LIDOCAINE 2% (20 MG/ML) 5 ML SYRINGE
INTRAMUSCULAR | Status: AC
  Filled 2016-07-02: qty 5

## 2016-07-02 MED ORDER — MOMETASONE FUROATE 50 MCG/ACT NA SUSP
2.0000 | Freq: Every day | NASAL | Status: DC | PRN
  Filled 2016-07-02: qty 17

## 2016-07-02 MED ORDER — HYDROMORPHONE HCL 1 MG/ML IJ SOLN
1.0000 mg | INTRAMUSCULAR | Status: DC | PRN
  Administered 2016-07-03 – 2016-07-04 (×3): 1 mg via INTRAVENOUS
  Filled 2016-07-02 (×3): qty 1

## 2016-07-02 MED ORDER — THROMBIN 5000 UNITS EX SOLR
CUTANEOUS | Status: AC
  Filled 2016-07-02: qty 5000

## 2016-07-02 MED ORDER — ONDANSETRON HCL 4 MG/2ML IJ SOLN
INTRAMUSCULAR | Status: AC
  Filled 2016-07-02: qty 2

## 2016-07-02 MED ORDER — ACETAMINOPHEN 325 MG PO TABS: 650.0000 mg | ORAL_TABLET | Freq: Four times a day (QID) | ORAL | Status: DC | PRN

## 2016-07-02 MED ORDER — SODIUM CHLORIDE 0.9% FLUSH
3.0000 mL | Freq: Two times a day (BID) | INTRAVENOUS | Status: DC
  Administered 2016-07-04 – 2016-07-07 (×6): 3 mL via INTRAVENOUS

## 2016-07-02 MED ORDER — SODIUM CHLORIDE 0.9 % IR SOLN
Status: DC | PRN
  Administered 2016-07-02 (×2): 500 mL

## 2016-07-02 MED ORDER — LIDOCAINE-EPINEPHRINE 2 %-1:100000 IJ SOLN
INTRAMUSCULAR | Status: DC | PRN
  Administered 2016-07-02: 7.5 mL

## 2016-07-02 MED ORDER — HEMOSTATIC AGENTS (NO CHARGE) OPTIME
TOPICAL | Status: DC | PRN
  Administered 2016-07-02: 1 via TOPICAL

## 2016-07-02 MED ORDER — DEXTROSE 5 % IV SOLN
INTRAVENOUS | Status: DC | PRN
  Administered 2016-07-02: 25 ug/min via INTRAVENOUS

## 2016-07-02 MED ORDER — VANCOMYCIN HCL IN DEXTROSE 1-5 GM/200ML-% IV SOLN
INTRAVENOUS | Status: AC
  Filled 2016-07-02: qty 200

## 2016-07-02 MED ORDER — DEXAMETHASONE SODIUM PHOSPHATE 10 MG/ML IJ SOLN
INTRAMUSCULAR | Status: DC | PRN
  Administered 2016-07-02: 10 mg via INTRAVENOUS

## 2016-07-02 MED ORDER — ONDANSETRON HCL 4 MG/2ML IJ SOLN
INTRAMUSCULAR | Status: DC | PRN
  Administered 2016-07-02: 4 mg via INTRAVENOUS

## 2016-07-02 MED ORDER — ASPIRIN EC 81 MG PO TBEC
81.0000 mg | DELAYED_RELEASE_TABLET | Freq: Every day | ORAL | Status: DC
  Administered 2016-07-03 – 2016-07-07 (×5): 81 mg via ORAL
  Filled 2016-07-02 (×5): qty 1

## 2016-07-02 MED ORDER — MIDAZOLAM HCL 2 MG/2ML IJ SOLN
INTRAMUSCULAR | Status: AC
  Filled 2016-07-02: qty 2

## 2016-07-02 MED ORDER — METHOCARBAMOL 500 MG PO TABS: 500.0000 mg | ORAL_TABLET | Freq: Three times a day (TID) | ORAL | Status: DC | PRN

## 2016-07-02 MED ORDER — PROPOFOL 10 MG/ML IV BOLUS
INTRAVENOUS | Status: AC
  Filled 2016-07-02: qty 20

## 2016-07-02 MED ORDER — LORATADINE 10 MG PO TABS
10.0000 mg | ORAL_TABLET | Freq: Every day | ORAL | Status: DC
  Administered 2016-07-03 – 2016-07-07 (×5): 10 mg via ORAL
  Filled 2016-07-02 (×5): qty 1

## 2016-07-02 MED ORDER — VANCOMYCIN HCL 1000 MG IV SOLR
INTRAVENOUS | Status: AC
  Filled 2016-07-02: qty 1000

## 2016-07-02 MED ORDER — EPHEDRINE SULFATE 50 MG/ML IJ SOLN
INTRAMUSCULAR | Status: DC | PRN
  Administered 2016-07-02: 15 mg via INTRAVENOUS

## 2016-07-02 MED ORDER — PREDNISONE 5 MG PO TABS
5.0000 mg | ORAL_TABLET | Freq: Every day | ORAL | Status: DC
  Administered 2016-07-03 – 2016-07-07 (×5): 5 mg via ORAL
  Filled 2016-07-02 (×5): qty 1

## 2016-07-02 MED ORDER — 0.9 % SODIUM CHLORIDE (POUR BTL) OPTIME
TOPICAL | Status: DC | PRN
  Administered 2016-07-02 (×2): 1000 mL

## 2016-07-02 MED ORDER — SCOPOLAMINE 1 MG/3DAYS TD PT72: 1.0000 | MEDICATED_PATCH | Freq: Once | TRANSDERMAL | Status: DC

## 2016-07-02 MED ORDER — LIDOCAINE-EPINEPHRINE 2 %-1:100000 IJ SOLN
INTRAMUSCULAR | Status: AC
  Filled 2016-07-02: qty 1

## 2016-07-02 MED ORDER — FUROSEMIDE 20 MG PO TABS
20.0000 mg | ORAL_TABLET | Freq: Every day | ORAL | Status: DC
  Administered 2016-07-03 – 2016-07-07 (×5): 20 mg via ORAL
  Filled 2016-07-02 (×5): qty 1

## 2016-07-02 MED ORDER — CYCLOBENZAPRINE HCL 10 MG PO TABS
10.0000 mg | ORAL_TABLET | Freq: Three times a day (TID) | ORAL | Status: DC | PRN
  Filled 2016-07-02: qty 1

## 2016-07-02 MED ORDER — ELVITEG-COBIC-EMTRICIT-TENOFAF 150-150-200-10 MG PO TABS
1.0000 | ORAL_TABLET | Freq: Every day | ORAL | Status: DC
  Administered 2016-07-03 – 2016-07-07 (×5): 1 via ORAL
  Filled 2016-07-02 (×6): qty 1

## 2016-07-02 MED ORDER — ACETAMINOPHEN 325 MG PO TABS: 650.0000 mg | ORAL_TABLET | ORAL | Status: DC | PRN

## 2016-07-02 MED ORDER — BUPIVACAINE HCL (PF) 0.25 % IJ SOLN
INTRAMUSCULAR | Status: DC | PRN
  Administered 2016-07-02: 7.5 mL

## 2016-07-02 MED ORDER — LACTATED RINGERS IV SOLN
INTRAVENOUS | Status: DC
  Administered 2016-07-02 (×2): via INTRAVENOUS

## 2016-07-02 MED ORDER — ALBUMIN HUMAN 5 % IV SOLN
INTRAVENOUS | Status: DC | PRN
  Administered 2016-07-02: 12:00:00 via INTRAVENOUS

## 2016-07-02 MED ORDER — POLYETHYLENE GLYCOL 3350 17 G PO PACK
17.0000 g | PACK | Freq: Every day | ORAL | Status: DC | PRN
  Administered 2016-07-05: 17 g via ORAL
  Filled 2016-07-02: qty 1

## 2016-07-02 MED ORDER — ATORVASTATIN CALCIUM 10 MG PO TABS
10.0000 mg | ORAL_TABLET | Freq: Every day | ORAL | Status: DC
  Administered 2016-07-02 – 2016-07-06 (×5): 10 mg via ORAL
  Filled 2016-07-02 (×5): qty 1

## 2016-07-02 MED ORDER — FENTANYL CITRATE (PF) 100 MCG/2ML IJ SOLN
INTRAMUSCULAR | Status: AC
  Filled 2016-07-02: qty 4

## 2016-07-02 MED ORDER — ONDANSETRON HCL 4 MG/2ML IJ SOLN
4.0000 mg | INTRAMUSCULAR | Status: DC | PRN
  Administered 2016-07-03: 4 mg via INTRAVENOUS
  Filled 2016-07-02: qty 2

## 2016-07-02 MED ORDER — VANCOMYCIN HCL 1000 MG IV SOLR
INTRAVENOUS | Status: DC | PRN
  Administered 2016-07-02: 1000 mg via INTRAVENOUS

## 2016-07-02 MED ORDER — BUPIVACAINE HCL (PF) 0.25 % IJ SOLN
INTRAMUSCULAR | Status: AC
  Filled 2016-07-02: qty 30

## 2016-07-02 MED ORDER — EPHEDRINE 5 MG/ML INJ
INTRAVENOUS | Status: AC
  Filled 2016-07-02: qty 10

## 2016-07-02 MED ORDER — ESCITALOPRAM OXALATE 10 MG PO TABS
10.0000 mg | ORAL_TABLET | Freq: Every day | ORAL | Status: DC
  Administered 2016-07-03 – 2016-07-07 (×5): 10 mg via ORAL
  Filled 2016-07-02 (×5): qty 1

## 2016-07-02 MED ORDER — ACETAMINOPHEN 650 MG RE SUPP: 650.0000 mg | RECTAL | Status: DC | PRN

## 2016-07-02 MED ORDER — SUGAMMADEX SODIUM 200 MG/2ML IV SOLN
INTRAVENOUS | Status: DC | PRN
  Administered 2016-07-02: 100 mg via INTRAVENOUS

## 2016-07-02 MED ORDER — PHENOL 1.4 % MT LIQD: 1.0000 | OROMUCOSAL | Status: DC | PRN

## 2016-07-02 MED ORDER — PHENYLEPHRINE 40 MCG/ML (10ML) SYRINGE FOR IV PUSH (FOR BLOOD PRESSURE SUPPORT)
PREFILLED_SYRINGE | INTRAVENOUS | Status: AC
  Filled 2016-07-02: qty 10

## 2016-07-02 MED ORDER — BUPIVACAINE LIPOSOME 1.3 % IJ SUSP
20.0000 mL | INTRAMUSCULAR | Status: AC
  Administered 2016-07-02: 20 mL
  Filled 2016-07-02: qty 20

## 2016-07-02 MED ORDER — PREGABALIN 75 MG PO CAPS
200.0000 mg | ORAL_CAPSULE | Freq: Two times a day (BID) | ORAL | Status: DC
  Administered 2016-07-02 – 2016-07-07 (×10): 200 mg via ORAL
  Filled 2016-07-02 (×10): qty 2

## 2016-07-02 MED ORDER — PHENYLEPHRINE HCL 10 MG/ML IJ SOLN
INTRAMUSCULAR | Status: DC | PRN
  Administered 2016-07-02: 160 ug via INTRAVENOUS

## 2016-07-02 MED ORDER — LIDOCAINE HCL (CARDIAC) 20 MG/ML IV SOLN
INTRAVENOUS | Status: DC | PRN
  Administered 2016-07-02: 80 mg via INTRAVENOUS

## 2016-07-02 MED ORDER — PROPOFOL 10 MG/ML IV BOLUS
INTRAVENOUS | Status: DC | PRN
  Administered 2016-07-02: 200 mg via INTRAVENOUS

## 2016-07-02 MED ORDER — ROCURONIUM BROMIDE 100 MG/10ML IV SOLN
INTRAVENOUS | Status: DC | PRN
  Administered 2016-07-02 (×2): 10 mg via INTRAVENOUS
  Administered 2016-07-02: 50 mg via INTRAVENOUS

## 2016-07-02 MED ORDER — DEXAMETHASONE SODIUM PHOSPHATE 10 MG/ML IJ SOLN
INTRAMUSCULAR | Status: AC
  Filled 2016-07-02: qty 1

## 2016-07-02 MED ORDER — ACETAMINOPHEN 650 MG RE SUPP: 650.0000 mg | Freq: Four times a day (QID) | RECTAL | Status: DC | PRN

## 2016-07-02 MED ORDER — CICLOPIROX OLAMINE 0.77 % EX SUSP: 1.0000 "application " | Freq: Two times a day (BID) | CUTANEOUS | Status: DC | PRN

## 2016-07-02 MED ORDER — HYDROMORPHONE HCL 1 MG/ML IJ SOLN: 0.2500 mg | INTRAMUSCULAR | Status: DC | PRN

## 2016-07-02 MED ORDER — DARUNAVIR ETHANOLATE 800 MG PO TABS
800.0000 mg | ORAL_TABLET | Freq: Every day | ORAL | Status: DC
  Administered 2016-07-03 – 2016-07-07 (×5): 800 mg via ORAL
  Filled 2016-07-02 (×6): qty 1

## 2016-07-02 MED ORDER — ROCURONIUM BROMIDE 50 MG/5ML IV SOSY
PREFILLED_SYRINGE | INTRAVENOUS | Status: AC
  Filled 2016-07-02: qty 5

## 2016-07-02 MED ORDER — SULFAMETHOXAZOLE-TRIMETHOPRIM 800-160 MG PO TABS
1.0000 | ORAL_TABLET | Freq: Two times a day (BID) | ORAL | Status: DC
  Administered 2016-07-02 – 2016-07-07 (×10): 1 via ORAL
  Filled 2016-07-02 (×10): qty 1

## 2016-07-02 MED ORDER — PANTOPRAZOLE SODIUM 40 MG PO TBEC
40.0000 mg | DELAYED_RELEASE_TABLET | Freq: Every day | ORAL | Status: DC
  Administered 2016-07-02 – 2016-07-03 (×2): 40 mg via ORAL
  Filled 2016-07-02 (×2): qty 1

## 2016-07-02 MED ORDER — PANTOPRAZOLE SODIUM 40 MG IV SOLR: 40.0000 mg | Freq: Every day | INTRAVENOUS | Status: DC

## 2016-07-02 MED ORDER — SODIUM CHLORIDE 0.9 % IV SOLN: 250.0000 mL | INTRAVENOUS | Status: DC

## 2016-07-02 MED ORDER — FENTANYL CITRATE (PF) 100 MCG/2ML IJ SOLN
INTRAMUSCULAR | Status: DC | PRN
  Administered 2016-07-02: 100 ug via INTRAVENOUS
  Administered 2016-07-02 (×2): 50 ug via INTRAVENOUS

## 2016-07-02 MED ORDER — SUGAMMADEX SODIUM 200 MG/2ML IV SOLN
INTRAVENOUS | Status: AC
  Filled 2016-07-02: qty 2

## 2016-07-02 MED ORDER — THROMBIN 5000 UNITS EX SOLR
OROMUCOSAL | Status: DC | PRN
  Administered 2016-07-02: 16:00:00 via TOPICAL

## 2016-07-02 MED ORDER — ALUM & MAG HYDROXIDE-SIMETH 200-200-20 MG/5ML PO SUSP
30.0000 mL | Freq: Four times a day (QID) | ORAL | Status: DC | PRN
  Administered 2016-07-03: 30 mL via ORAL
  Filled 2016-07-02: qty 30

## 2016-07-02 MED ORDER — THROMBIN 20000 UNITS EX SOLR
CUTANEOUS | Status: DC | PRN
  Administered 2016-07-02: 20 mL via TOPICAL

## 2016-07-02 MED ORDER — KETAMINE HCL-SODIUM CHLORIDE 100-0.9 MG/10ML-% IV SOSY
PREFILLED_SYRINGE | INTRAVENOUS | Status: AC
  Filled 2016-07-02: qty 10

## 2016-07-02 MED ORDER — CEFAZOLIN SODIUM-DEXTROSE 2-4 GM/100ML-% IV SOLN
2.0000 g | Freq: Three times a day (TID) | INTRAVENOUS | Status: AC
  Administered 2016-07-02 – 2016-07-07 (×15): 2 g via INTRAVENOUS
  Filled 2016-07-02 (×15): qty 100

## 2016-07-02 MED ORDER — KETAMINE HCL 10 MG/ML IJ SOLN
INTRAMUSCULAR | Status: DC | PRN
  Administered 2016-07-02: 10 mg via INTRAVENOUS
  Administered 2016-07-02: 40 mg via INTRAVENOUS

## 2016-07-02 MED ORDER — MENTHOL 3 MG MT LOZG: 1.0000 | LOZENGE | OROMUCOSAL | Status: DC | PRN

## 2016-07-02 MED ORDER — METOPROLOL SUCCINATE ER 25 MG PO TB24
25.0000 mg | ORAL_TABLET | Freq: Every day | ORAL | Status: DC
  Administered 2016-07-03 – 2016-07-05 (×3): 25 mg via ORAL
  Filled 2016-07-02 (×3): qty 1

## 2016-07-02 MED ORDER — THROMBIN 20000 UNITS EX SOLR
CUTANEOUS | Status: AC
  Filled 2016-07-02: qty 20000

## 2016-07-02 MED ORDER — PROMETHAZINE HCL 25 MG/ML IJ SOLN: 6.2500 mg | INTRAMUSCULAR | Status: DC | PRN

## 2016-07-02 MED ORDER — VANCOMYCIN HCL 1000 MG IV SOLR
INTRAVENOUS | Status: DC | PRN
  Administered 2016-07-02: 1000 mg via TOPICAL

## 2016-07-02 SURGICAL SUPPLY — 75 items
BAG DECANTER FOR FLEXI CONT (MISCELLANEOUS) ×4 IMPLANT
BENZOIN TINCTURE PRP APPL 2/3 (GAUZE/BANDAGES/DRESSINGS) ×2 IMPLANT
BLADE CLIPPER SURG (BLADE) IMPLANT
BLADE SURG 11 STRL SS (BLADE) ×2 IMPLANT
BONE MATRIX VIVIGEN 16.CC/XL (Bone Implant) ×2 IMPLANT
BUR CUTTER 7.0 ROUND (BURR) ×2 IMPLANT
BUR MATCHSTICK NEURO 3.0 LAGG (BURR) ×2 IMPLANT
CANISTER SUCT 3000ML PPV (MISCELLANEOUS) ×2 IMPLANT
CAP LOCKING THREADED (Cap) ×12 IMPLANT
CARTRIDGE OIL MAESTRO DRILL (MISCELLANEOUS) ×1 IMPLANT
CLAMP CONNECTOR PAR 5.5-5.5WD (Clamp) ×4 IMPLANT
CONT SPEC 4OZ CLIKSEAL STRL BL (MISCELLANEOUS) ×4 IMPLANT
COVER BACK TABLE 60X90IN (DRAPES) ×2 IMPLANT
DECANTER SPIKE VIAL GLASS SM (MISCELLANEOUS) ×2 IMPLANT
DERMABOND ADVANCED (GAUZE/BANDAGES/DRESSINGS) ×1
DERMABOND ADVANCED .7 DNX12 (GAUZE/BANDAGES/DRESSINGS) ×1 IMPLANT
DIFFUSER DRILL AIR PNEUMATIC (MISCELLANEOUS) ×2 IMPLANT
DRAPE C-ARM 42X72 X-RAY (DRAPES) ×2 IMPLANT
DRAPE C-ARMOR (DRAPES) ×2 IMPLANT
DRAPE HALF SHEET 40X57 (DRAPES) IMPLANT
DRAPE LAPAROTOMY 100X72X124 (DRAPES) ×2 IMPLANT
DRAPE POUCH INSTRU U-SHP 10X18 (DRAPES) ×2 IMPLANT
DRAPE SURG 17X23 STRL (DRAPES) ×2 IMPLANT
DRSG OPSITE 4X5.5 SM (GAUZE/BANDAGES/DRESSINGS) ×2 IMPLANT
DRSG OPSITE POSTOP 4X10 (GAUZE/BANDAGES/DRESSINGS) ×2 IMPLANT
DURAPREP 26ML APPLICATOR (WOUND CARE) ×2 IMPLANT
DURASEAL APPLICATOR TIP (TIP) ×2 IMPLANT
DURASEAL SPINE SEALANT 3ML (MISCELLANEOUS) ×2 IMPLANT
ELECT REM PT RETURN 9FT ADLT (ELECTROSURGICAL) ×2
ELECTRODE REM PT RTRN 9FT ADLT (ELECTROSURGICAL) ×1 IMPLANT
EVACUATOR 3/16  PVC DRAIN (DRAIN) ×1
EVACUATOR 3/16 PVC DRAIN (DRAIN) ×1 IMPLANT
GAUZE SPONGE 4X4 12PLY STRL (GAUZE/BANDAGES/DRESSINGS) ×2 IMPLANT
GAUZE SPONGE 4X4 16PLY XRAY LF (GAUZE/BANDAGES/DRESSINGS) ×2 IMPLANT
GLOVE BIO SURGEON STRL SZ8 (GLOVE) ×4 IMPLANT
GLOVE BIOGEL PI IND STRL 7.5 (GLOVE) ×1 IMPLANT
GLOVE BIOGEL PI INDICATOR 7.5 (GLOVE) ×1
GLOVE ECLIPSE 7.5 STRL STRAW (GLOVE) IMPLANT
GLOVE INDICATOR 8.5 STRL (GLOVE) ×4 IMPLANT
GOWN STRL REUS W/ TWL LRG LVL3 (GOWN DISPOSABLE) ×2 IMPLANT
GOWN STRL REUS W/ TWL XL LVL3 (GOWN DISPOSABLE) ×2 IMPLANT
GOWN STRL REUS W/TWL 2XL LVL3 (GOWN DISPOSABLE) IMPLANT
GOWN STRL REUS W/TWL LRG LVL3 (GOWN DISPOSABLE) ×2
GOWN STRL REUS W/TWL XL LVL3 (GOWN DISPOSABLE) ×2
GRAFT BN 10X1XDBM MAGNIFUSE (Bone Implant) ×1 IMPLANT
GRAFT BONE MAGNIFUSE 1X10CM (Bone Implant) ×1 IMPLANT
GRAFT DURAGEN MATRIX 1WX1L (Tissue) ×2 IMPLANT
HEMOSTAT POWDER KIT SURGIFOAM (HEMOSTASIS) ×4 IMPLANT
KIT BASIN OR (CUSTOM PROCEDURE TRAY) ×2 IMPLANT
KIT ROOM TURNOVER OR (KITS) ×2 IMPLANT
MARKER SKIN DUAL TIP RULER LAB (MISCELLANEOUS) ×2 IMPLANT
MILL MEDIUM DISP (BLADE) ×2 IMPLANT
NEEDLE HYPO 21X1.5 SAFETY (NEEDLE) ×2 IMPLANT
NEEDLE HYPO 25X1 1.5 SAFETY (NEEDLE) ×2 IMPLANT
NS IRRIG 1000ML POUR BTL (IV SOLUTION) ×4 IMPLANT
OIL CARTRIDGE MAESTRO DRILL (MISCELLANEOUS) ×2
PACK LAMINECTOMY NEURO (CUSTOM PROCEDURE TRAY) ×2 IMPLANT
PAD ARMBOARD 7.5X6 YLW CONV (MISCELLANEOUS) ×6 IMPLANT
ROD HEXENDED ALLOY 5.5X500MM (Rod) ×2 IMPLANT
SCREW CREO 5.5X40 (Screw) ×12 IMPLANT
SCREW PA THRD CREO TULIP 5.5X4 (Head) ×12 IMPLANT
SPONGE LAP 4X18 X RAY DECT (DISPOSABLE) IMPLANT
SPONGE SURGIFOAM ABS GEL 100 (HEMOSTASIS) ×2 IMPLANT
STRIP CLOSURE SKIN 1/2X4 (GAUZE/BANDAGES/DRESSINGS) ×2 IMPLANT
SUT PROLENE 6 0 BV (SUTURE) ×6 IMPLANT
SUT VIC AB 0 CT1 18XCR BRD8 (SUTURE) ×3 IMPLANT
SUT VIC AB 0 CT1 8-18 (SUTURE) ×3
SUT VIC AB 2-0 CT1 18 (SUTURE) ×6 IMPLANT
SUT VIC AB 4-0 PS2 27 (SUTURE) ×2 IMPLANT
SYR 20CC LL (SYRINGE) ×2 IMPLANT
SYR CONTROL 10ML LL (SYRINGE) ×2 IMPLANT
TOWEL OR 17X24 6PK STRL BLUE (TOWEL DISPOSABLE) ×2 IMPLANT
TOWEL OR 17X26 10 PK STRL BLUE (TOWEL DISPOSABLE) ×2 IMPLANT
TRAY FOLEY W/METER SILVER 16FR (SET/KITS/TRAYS/PACK) ×2 IMPLANT
WATER STERILE IRR 1000ML POUR (IV SOLUTION) ×2 IMPLANT

## 2016-07-02 NOTE — Transfer of Care (Signed)
Immediate Anesthesia Transfer of Care Note  Patient: [name]  Procedure(s) Performed: Procedure(s): Decompressive Laminectomy Thoracic twelve-Lumbar one, Posterior Fusion Thoracic ten-Lumbar two with pedicle screws, Posterior Lateral Arthrodesis Thoracic ten-Lumbar two (N/A)  Patient Location: PACU  Anesthesia Type:General  Level of Consciousness: lethargic and responds to stimulation  Airway & Oxygen Therapy: Patient Spontanous Breathing and Patient connected to nasal cannula oxygen  Post-op Assessment: Report given to RN  Post vital signs: Reviewed and stable  Last Vitals:  Vitals:   07/02/16 0916 07/02/16 1632  BP: (!) 124/53 (!) 142/90  Pulse: 69 95  Resp: 18 13  Temp: 36.7 C 36.3 C    Last Pain:  Vitals:   07/02/16 1632  TempSrc:   PainSc: (P) Asleep         Complications: No apparent anesthesia complications

## 2016-07-02 NOTE — H&P (Signed)
[name] is an 66 y.o. male.   Chief Complaint: Back pain and hip pain HPI: 66 year old gentleman with multiple medical problems include HIV hepatitis chronic renal failure presents with progressive segmental breakdown and degenerative disc disease at T12-L1 level above a previous L1 sacrum fusion with severe stenosis at T12-L1 and kyphosis and instability at T12-L1. Due to patient's progression of clinical syndrome imaging findings failure conservative treatment I recommended posterior thoraco-lumbar fusion with pedicle screws at T10-11 and 12 and tying into his old construct at L1-L2. I've extensively gone over the risks and benefits of that operation with him including a decompressive laminectomy at T12-L1 perioperative course expectations of outcome and alternatives of surgery and he understands and agrees to proceed forward.  Past Medical History:  Diagnosis Date  . Carotid artery occlusion    40-60% right ICA stenosis (09/2008)  . Chronic back pain    follows with Nsurg  . Chronic back pain   . CLL (chronic lymphoblastic leukemia) dx 2010   Followed at [facility] q53mo, no current therapy   . Coronary artery disease 2010   s/p CABG '10, sees Dr. Percival Spanish  . Depression   . Diverticulosis   . DVT, lower extremity, recurrent ([place]) 2008, 2009   LLE, chronic anticoag since 2009  . Esophagitis   . Fibromyalgia   . Gallstones   . GERD (gastroesophageal reflux disease)   . Gout   . Gynecomastia, male   . H/O hiatal hernia 2008   surgery  . Hemorrhoids   . Hepatitis A yrs ago  . HIV infection ([place]) dx 1993  . Hypertension   . Impotence of organic origin   . Myocardial infarction 2010    x 2  . Neuromuscular disorder (HCC)    neuropathy  . Osteoarthritis, knee    s/p B TKA  . Pneumonia mrach, may, july 2017  . Rheumatoid arthritis(714.0) dx 2010   MTX, follows with rheum  . Seasonal allergies   . Secondary syphilis 07/24/14 dx   s/p 2 wks doxy  . Status post dilation of  esophageal narrowing   . Stroke ([place]) [place]   . TIA (transient ischemic attack) 1997   mild residual L mouth droop  . Tubular adenoma of colon     Past Surgical History:  Procedure Laterality Date  . CHOLECYSTECTOMY    . COLONOSCOPY WITH PROPOFOL N/A 12/28/2012   Procedure: COLONOSCOPY WITH PROPOFOL;  Surgeon: Jerene Bears, MD;  Location: [facility];  Service: Gastroenterology;  Laterality: N/A;  . CORONARY ARTERY BYPASS GRAFT  2010   triple bypass  . ESOPHAGOGASTRODUODENOSCOPY (EGD) WITH PROPOFOL N/A 12/28/2012   Procedure: ESOPHAGOGASTRODUODENOSCOPY (EGD) WITH PROPOFOL;  Surgeon: Jerene Bears, MD;  Location: [facility];  Service: Gastroenterology;  Laterality: N/A;  . ESOPHAGOGASTRODUODENOSCOPY (EGD) WITH PROPOFOL N/A 03/15/2013   Procedure: ESOPHAGOGASTRODUODENOSCOPY (EGD) WITH PROPOFOL;  Surgeon: Jerene Bears, MD;  Location: [facility];  Service: Gastroenterology;  Laterality: N/A;  . ESOPHAGOGASTRODUODENOSCOPY (EGD) WITH PROPOFOL N/A 02/07/2016   Procedure: ESOPHAGOGASTRODUODENOSCOPY (EGD) WITH PROPOFOL;  Surgeon: Jerene Bears, MD;  Location: [facility];  Service: Gastroenterology;  Laterality: N/A;  . HARDWARE REMOVAL N/A 07/02/2012   Procedure: HARDWARE REMOVAL;  Surgeon: Elaina Hoops, MD;  Location: [place] [facility];  Service: Neurosurgery;  Laterality: N/A;  . HIATAL HERNIA REPAIR     wrap  . INGUINAL HERNIA REPAIR Bilateral   . JOINT REPLACEMENT Left 1999  . KNEE ARTHROPLASTY  07/22/2011   Procedure: COMPUTER  ASSISTED TOTAL KNEE ARTHROPLASTY;  Surgeon: Meredith Pel, MD;  Location: [facility];  Service: Orthopedics;  Laterality: Right;  Right total knee arthroplasty  . MANDIBLE SURGERY Bilateral    tmj  . REPLACEMENT TOTAL KNEE Bilateral   . ring around testicle hernia reapir  184 and 1986   x 2  . ROTATOR CUFF REPAIR Right   . SHOULDER SURGERY Left   . SPINE SURGERY  2010   "rod and screws", "failed", lopwer spine,   . stent to heart x 1  2010  . TONSILLECTOMY     . UMBILICAL HERNIA REPAIR     x 1  . varicose vein     stripping  . VIDEO BRONCHOSCOPY Bilateral 10/16/2015   Procedure: VIDEO BRONCHOSCOPY WITHOUT FLUORO;  Surgeon: Brand Males, MD;  Location: [facility];  Service: Cardiopulmonary;  Laterality: Bilateral;    Family History  Problem Relation Age of Onset  . Breast cancer Mother   . Hypertension Mother   . Hyperlipidemia Mother   . Diabetes Mother   . Prostate cancer Father   . Colon polyps Father   . Hyperlipidemia Father   . Crohn's disease Paternal Aunt   . Diabetes Maternal Grandmother   . Diabetes Brother     x 3  . Heart disease Brother     x 3  . Hyperlipidemia Brother     x 3  . Alcohol abuse Daughter   . Drug abuse Daughter   . Asthma Brother   . Colon cancer Neg Hx    Social History:  reports that he has never smoked. He has never used smokeless tobacco. He reports that he drinks alcohol. He reports that he does not use drugs.  Allergies:  Allergies  Allergen Reactions  . Other Anaphylaxis and Hives    Pecan  . Peanut-Containing Drug Products Anaphylaxis, Hives and Swelling    Swelling of throat  . Morphine Other (See Comments)    REACTION: severe headache  . Oxycodone-Acetaminophen Other (See Comments)    REACTION: headache  . Penicillins Rash and Other (See Comments)    FLUSHED, RED  Has patient had a PCN reaction causing immediate rash, facial/tongue/throat swelling, SOB or lightheadedness with hypotension: #  #  #  YES  #  #  #  Has patient had a PCN reaction causing severe rash involving mucus membranes or skin necrosis: No Has patient had a PCN reaction that required hospitalization No Has patient had a PCN reaction occurring within the last 10 years: No If all of the above answers are "NO", then may proceed with Cephalosporin use  . Promethazine Hcl Other (See Comments)    REACTION: makes him feel drunk    Medications Prior to Admission  Medication Sig Dispense Refill  . aspirin EC 81 MG  tablet Take 81 mg by mouth daily.    [name] atorvastatin (LIPITOR) 10 MG tablet TAKE 1 TABLET (10 MG TOTAL) BY MOUTH AT BEDTIME. 90 tablet 2  . edoxaban (SAVAYSA) 30 MG TABS tablet Take 1 tablet (30 mg total) by mouth daily. 90 tablet 1  . elvitegravir-cobicistat-emtricitabine-tenofovir (GENVOYA) 150-150-200-10 MG TABS tablet Take 1 tablet by mouth daily with breakfast. 30 tablet 5  . escitalopram (LEXAPRO) 10 MG tablet Take 1 tablet (10 mg total) by mouth daily. 90 tablet 3  . furosemide (LASIX) 20 MG tablet TAKE 1 TABLET (20 MG TOTAL) BY MOUTH 2 (TWO) TIMES DAILY. 60 tablet 5  . HYDROmorphone (DILAUDID) 2 MG tablet Take 1 tablet (  2 mg total) by mouth every 6 (six) hours as needed for severe pain. 100 tablet 0  . loratadine (CLARITIN) 10 MG tablet TAKE 1 TABLET (10 MG TOTAL) BY MOUTH DAILY. 90 tablet 3  . methocarbamol (ROBAXIN) 500 MG tablet Take 1 tablet (500 mg total) by mouth every 8 (eight) hours as needed for muscle spasms. 75 tablet 4  . metoprolol succinate (TOPROL-XL) 25 MG 24 hr tablet Take 1 tablet (25 mg total) by mouth daily. Please schedule appointment for refills. (Patient taking differently: Take 25 mg by mouth at bedtime. ) 30 tablet 6  . ondansetron (ZOFRAN ODT) 4 MG disintegrating tablet Take 1 tablet (4 mg total) by mouth every 8 (eight) hours as needed for nausea or vomiting. 20 tablet 0  . pantoprazole (PROTONIX) 40 MG tablet Take 1 tablet (40 mg total) by mouth 2 (two) times daily before a meal. (Patient taking differently: Take 40 mg by mouth at bedtime. ) 60 tablet 3  . predniSONE (DELTASONE) 5 MG tablet Take 5 mg by mouth daily with breakfast.     . pregabalin (LYRICA) 200 MG capsule Take 1 capsule (200 mg total) by mouth 2 (two) times daily. 60 capsule 1  . PREZISTA 800 MG tablet TAKE 1 TABLET (800 MG TOTAL) BY MOUTH DAILY. 30 tablet 5  . sulfamethoxazole-trimethoprim (BACTRIM DS,SEPTRA DS) 800-160 MG tablet TAKE 1 TABLET BY MOUTH EVERY 12 (TWELVE) HOURS. 60 tablet 3  .  valACYclovir (VALTREX) 500 MG tablet TAKE 1 TABLET BY MOUTH EVERY DAY (Patient taking differently: TAKE 1 TABLET BY MOUTH EVERY DAY AT BEDTIME.) 90 tablet 3  . acetaminophen (TYLENOL) 325 MG tablet Take 2 tablets (650 mg total) by mouth every 6 (six) hours as needed for mild pain (or Fever >/= 101). 30 tablet 0  . Certolizumab Pegol (CIMZIA [place]) Inject into the skin. Pt unsure of dose. Every four weeks.    . ciclopirox (LOPROX) 0.77 % SUSP Apply 1 Act topically 2 (two) times daily. (Patient taking differently: Apply 1 application topically 2 (two) times daily as needed (for rash in groin). ) 60 mL 5  . mometasone (NASONEX) 50 MCG/ACT nasal spray Place 2 sprays into the nose daily as needed (for nasal congestion.).    [name] polyethylene glycol (MIRALAX / GLYCOLAX) packet Take 17 g by mouth daily as needed for mild constipation. 14 each 0    No results found for this or any previous visit (from the past 48 hour(s)). No results found.  Review of Systems  Musculoskeletal: Positive for back pain, joint pain and myalgias.  Neurological: Positive for tingling and sensory change.    Blood pressure (!) 124/53, pulse 69, temperature 98 F (36.7 C), temperature source Oral, resp. rate 18, height 5\' 7"  (1.702 m), weight 83.9 kg (185 lb), SpO2 98 %. Physical Exam  Constitutional: He appears well-developed.  Eyes: Pupils are equal, round, and reactive to light.  Neck: Normal range of motion.  GI: Soft. Bowel sounds are normal.  Neurological: He is alert. He has normal strength. GCS eye subscore is 4. GCS verbal subscore is 5. GCS motor subscore is 6.  Patient is awake alert strength 4+ out of 5 iliopsoas quads 5 out of 5 distally     Assessmen60 or gentleman presents for fracture lumbar stabilization with decompressive laminectomy T12-L1 patient has a history of HIV mild chronic hepatitis and renal failure.  Rainelle Sulewski P, MD 07/02/2016, 11:45 AM

## 2016-07-02 NOTE — Progress Notes (Signed)
Patient arrived to the floor. Reports nausea. Patient is encouraged to adhere to staying flat as ordered. Personal items are within reach. Will continue to monitor.

## 2016-07-02 NOTE — Anesthesia Postprocedure Evaluation (Addendum)
Anesthesia Post Note  Patient: [name]  Procedure(s) Performed: Procedure(s) (LRB): Decompressive Laminectomy Thoracic twelve-Lumbar one, Posterior Fusion Thoracic ten-Lumbar two with pedicle screws, Posterior Lateral Arthrodesis Thoracic ten-Lumbar two (N/A)  Patient location during evaluation: PACU Anesthesia Type: General Level of consciousness: sedated Pain management: pain level controlled Vital Signs Assessment: post-procedure vital signs reviewed and stable Respiratory status: spontaneous breathing and respiratory function stable Cardiovascular status: stable Anesthetic complications: no       Last Vitals:  Vitals:   07/02/16 1715 07/02/16 1719  BP: 117/69 117/69  Pulse:  84  Resp:  14  Temp:      Last Pain:  Vitals:   07/02/16 1719  TempSrc:   PainSc: 0-No pain                 Nekia Maxham DANIEL

## 2016-07-02 NOTE — Op Note (Signed)
Preoperative diagnosis: Degenerative disc disease lumbar spinal stenosis and instability T12-L1  Postoperative diagnosis: Same  Procedure: #1 exploration of fusion and dissection around residual rod on the left at L1 as well as dissection through an exposure of the construct at L1-2 on the right  #2 decompressive thoracic laminectomy T12-L1  #3 pedicle screw fixation T10-L2 utilizing the globus Creo pedicle screw set with globus addition  #4 posterior lateral arthrodesis T10-L2 utilizing vivigen, magnafuse, and locally harvested autograft  Surgeon: Dominica Severin Moyinoluwa Dawe  Asst.: Dayton Bailiff  Anesthesia: Gen. You EBL: Minimal  History of present illness: Patient is 66 year old gentleman extending his back he previously undergone L1 to the sacrum fusion and outside institution developed progress worsening back pain over the last several weeks and months workup has revealed segment breakdown above his fusion at T12-L1 with severe spinal stenosis. Due to patient's progression of clinical syndrome failure conservative treatment imaging findings and recommended exploration of fusion decompressive laminectomy at T12-L1 and dry: Lumbar fusion from T10-L2. I extensively reviewed the risks and benefits of the operation the patient as well as perioperative course expectations of outcome and alternatives of surgery and he understands and agrees to proceed forward.  Operative procedure: Patient brought into the or was induced under general anesthesia positioned prone on Jackson table his back was prepped and draped in routine sterile fashion is old incision was opened up and extended cephalad. Subperiosteal dissections care lamina of T9, T10, T11, T12, and then exposed the fusion construct on down to just below L2. I then used AP and lateral fluoroscopy drill pilot holes pedicles at T10, T11, T12, and placed bilateral pedicle screws using utilizing the globus Creola system with 40 mm 5.5 screws. Then performed remove the  spinous process at T12 performed a central decompression there was marked stenosis and severe cord compression from a large spur coming off at the T12-L1 interspace. This was teased off the dura but during dissection suspect thickness dural tear arachnoid still was intact no CSF after actively leaking. Dissected around this decompressed the spinal cord hydrated primarily repair the dural defect with 6-0 Prolene then attention second 2, torn the rod utilizing the globus addition set up placed 2 connectors both side connectors bilaterally cut and contoured 2 rods anchored everything in place and torqued tightened down everything. Then the wound scope was irrigated meticulous hemostasis was maintained I inspected the dural repair and it was not leaking CSF side put a small piece of DuraGen and scored of the blue DuraSeal. Then aggressively decorticated the lamina TPs rib heads and facet complexes from T10 down to the fusion construct at L2. Packed the Vivigen, maganfuse, and locally harvested autograft. The wound scope was irrigated the extra was injected the fascia vancomycin powder was injected in the wound a medium large Hemovac drain was placed and the wounds closed in layers with after Vicryl running 4 subcuticular in the skin Dermabond benzo and Steri-Strips and sterile dressing was applied patient recovered in stable condition. At the end of case all needle counts and sponge counts were correct.

## 2016-07-02 NOTE — Anesthesia Procedure Notes (Signed)
Date/Time: 07/02/2016 12:16 PM Performed by: Duane Boston Patient Re-evaluated:Patient Re-evaluated prior to inductionOxygen Delivery Method: Circle system utilized Preoxygenation: Pre-oxygenation with 100% oxygen Intubation Type: IV induction Ventilation: Mask ventilation with difficulty and Oral airway inserted - appropriate to patient size Laryngoscope Size: Miller and 2 Grade View: Grade I Tube type: Oral Tube size: 8.0 mm Number of attempts: 1 Airway Equipment and Method: Stylet Placement Confirmation: ETT inserted through vocal cords under direct vision,  positive ETCO2 and breath sounds checked- equal and bilateral Secured at: 23 cm Tube secured with: Tape Dental Injury: Teeth and Oropharynx as per pre-operative assessment

## 2016-07-02 NOTE — Anesthesia Preprocedure Evaluation (Addendum)
Anesthesia Evaluation  Patient identified by MRN, date of birth, ID band Patient awake    Reviewed: Allergy & Precautions, NPO status , Patient's Chart, lab work & pertinent test results  History of Anesthesia Complications Negative for: history of anesthetic complications  Airway Mallampati: II  TM Distance: >3 FB Neck ROM: Full    Dental no notable dental hx. (+) Dental Advisory Given   Pulmonary neg pulmonary ROS,    Pulmonary exam normal        Cardiovascular hypertension, + CAD and + Past MI  Normal cardiovascular exam     Neuro/Psych PSYCHIATRIC DISORDERS Depression TIA Neuromuscular disease    GI/Hepatic Neg liver ROS, hiatal hernia, PUD, GERD  ,  Endo/Other  negative endocrine ROS  Renal/GU negative Renal ROS     Musculoskeletal   Abdominal   Peds  Hematology   Anesthesia Other Findings   Reproductive/Obstetrics                            Anesthesia Physical Anesthesia Plan  ASA: III  Anesthesia Plan: General   Post-op Pain Management:    Induction: Intravenous  Airway Management Planned:   Additional Equipment:   Intra-op Plan:   Post-operative Plan: Extubation in OR  Informed Consent: I have reviewed the patients History and Physical, chart, labs and discussed the procedure including the risks, benefits and alternatives for the proposed anesthesia with the patient or authorized representative who has indicated his/her understanding and acceptance.   Dental advisory given  Plan Discussed with: CRNA, Anesthesiologist and Surgeon  Anesthesia Plan Comments:        Anesthesia Quick Evaluation

## 2016-07-03 MED ORDER — DEXTROSE-NACL 5-0.45 % IV SOLN
INTRAVENOUS | Status: DC
  Administered 2016-07-03: 1000 mL via INTRAVENOUS
  Administered 2016-07-03 – 2016-07-05 (×2): via INTRAVENOUS

## 2016-07-03 NOTE — Care Management Note (Signed)
Case Management Note  Patient Details  Name: BRAEDAN VARIN MRN: [id number] Date of Birth: December 17, 1950  Subjective/Objective:    Pt underwent:    Decompressive Laminectomy Thoracic twelve-Lumbar one, Posterior Fusion Thoracic ten-Lumbar two with pedicle screws, Posterior Lateral Arthrodesis Thoracic ten-Lumbar two. He is from home.              Action/Plan: Pt currently on bedrest. Once able to get OOB, await PT/OT recommendations and physician orders. CM following.   Expected Discharge Date:                  Expected Discharge Plan:  Home/Self Care  In-House Referral:     Discharge planning Services     Post Acute Care Choice:    Choice offered to:     DME Arranged:    DME Agency:     HH Arranged:    HH Agency:     Status of Service:  In process, will continue to follow  If discussed at Long Length of Stay Meetings, dates discussed:    Additional Comments:  Pollie Friar, RN 07/03/2016, 12:11 PM

## 2016-07-03 NOTE — Progress Notes (Signed)
Patient doing well improved hip and leg pain  Afebrile systolic blood pressures 94  Strength 5 out of 5 wound clean dry and intact drain output 100 mL  Continue bedrest may logroll we'll DC drain later today.

## 2016-07-04 DIAGNOSIS — E785 Hyperlipidemia, unspecified: Secondary | ICD-10-CM

## 2016-07-04 DIAGNOSIS — K2210 Ulcer of esophagus without bleeding: Secondary | ICD-10-CM

## 2016-07-04 DIAGNOSIS — I25708 Atherosclerosis of coronary artery bypass graft(s), unspecified, with other forms of angina pectoris: Secondary | ICD-10-CM

## 2016-07-04 DIAGNOSIS — I10 Essential (primary) hypertension: Secondary | ICD-10-CM

## 2016-07-04 DIAGNOSIS — K449 Diaphragmatic hernia without obstruction or gangrene: Secondary | ICD-10-CM

## 2016-07-04 DIAGNOSIS — K219 Gastro-esophageal reflux disease without esophagitis: Secondary | ICD-10-CM

## 2016-07-04 LAB — CBC WITH DIFFERENTIAL/PLATELET
Basophils Absolute: 0 10*3/uL (ref 0.0–0.1)
Basophils Relative: 0 %
Eosinophils Absolute: 0 10*3/uL (ref 0.0–0.7)
Eosinophils Relative: 0 %
HCT: 30.5 % — ABNORMAL LOW (ref 39.0–52.0)
Hemoglobin: 9.6 g/dL — ABNORMAL LOW (ref 13.0–17.0)
Lymphocytes Relative: 46 %
Lymphs Abs: 10.6 10*3/uL — ABNORMAL HIGH (ref 0.7–4.0)
MCH: 27.9 pg (ref 26.0–34.0)
MCHC: 31.5 g/dL (ref 30.0–36.0)
MCV: 88.7 fL (ref 78.0–100.0)
Monocytes Absolute: 1.4 10*3/uL — ABNORMAL HIGH (ref 0.1–1.0)
Monocytes Relative: 6 %
Neutro Abs: 11.1 10*3/uL — ABNORMAL HIGH (ref 1.7–7.7)
Neutrophils Relative %: 48 %
Platelets: 150 10*3/uL (ref 150–400)
RBC: 3.44 MIL/uL — ABNORMAL LOW (ref 4.22–5.81)
RDW: 17.3 % — ABNORMAL HIGH (ref 11.5–15.5)
WBC: 23.1 10*3/uL — ABNORMAL HIGH (ref 4.0–10.5)

## 2016-07-04 LAB — BASIC METABOLIC PANEL
Anion gap: 7 (ref 5–15)
BUN: 12 mg/dL (ref 6–20)
CO2: 28 mmol/L (ref 22–32)
Calcium: 8.1 mg/dL — ABNORMAL LOW (ref 8.9–10.3)
Chloride: 101 mmol/L (ref 101–111)
Creatinine, Ser: 0.88 mg/dL (ref 0.61–1.24)
GFR calc Af Amer: >60 mL/min (ref >60)
GFR calc non Af Amer: >60 mL/min (ref >60)
Glucose, Bld: 121 mg/dL — ABNORMAL HIGH (ref 65–99)
Potassium: 3.6 mmol/L (ref 3.5–5.1)
Sodium: 136 mmol/L (ref 135–145)

## 2016-07-04 MED ORDER — PANTOPRAZOLE SODIUM 40 MG PO TBEC
40.0000 mg | DELAYED_RELEASE_TABLET | Freq: Two times a day (BID) | ORAL | Status: DC
  Administered 2016-07-04 – 2016-07-07 (×6): 40 mg via ORAL
  Filled 2016-07-04 (×6): qty 1

## 2016-07-04 MED ORDER — SUCRALFATE 1 GM/10ML PO SUSP
1.0000 g | Freq: Three times a day (TID) | ORAL | Status: DC
  Administered 2016-07-04 – 2016-07-07 (×13): 1 g via ORAL
  Filled 2016-07-04 (×13): qty 10

## 2016-07-04 MED ORDER — METOCLOPRAMIDE HCL 5 MG/ML IJ SOLN
5.0000 mg | Freq: Four times a day (QID) | INTRAMUSCULAR | Status: DC
  Administered 2016-07-04 – 2016-07-07 (×12): 5 mg via INTRAVENOUS
  Filled 2016-07-04 (×12): qty 2

## 2016-07-04 MED FILL — Sodium Chloride IV Soln 0.9%: INTRAVENOUS | Qty: 3000 | Status: AC

## 2016-07-04 MED FILL — Heparin Sodium (Porcine) Inj 1000 Unit/ML: INTRAMUSCULAR | Qty: 30 | Status: AC

## 2016-07-04 NOTE — Consult Note (Signed)
Medical Consultation   [name]  [id number]  DOB: 1950/12/08  DOA: 07/02/2016  PCP: [name], MD (Confirm with patient/family/NH records and if not entered, this has to be entered at [facility] point of entry)  Outpatient Specialists: GI - [place]; Cardiology - [place]   Requesting physician: Dr. Saintclair Halsted - neurosurgeon  Reason for consultation: epigastric / low esophageal pain  History of Present Illness: [name] is an 66 y.o. male HIV hepatitis, chronic renal disease, CLL, PVD with right ICA stenosis, CAD, status post CABG 3, DVT of the left lower extremity diagnosed in 2009, esophagitis with GERD status post multiple esophageal dilatations, hiatal hernia with remote surgical treatment, hypertension, rheumatoid arthritis, secondary syphilis, stroke who underwent complex T12-L2 spinal surgery by Dr. Saintclair Halsted on 07/02/2016. We were asked to see patient in consultation to address his complaints of abdominal pain.  Patient was doing well during postop period up until yesterday when he developed significant upper epigastric / lower esophageal pain after eating hamburger at dinner Patient described the pain as severe tightness and pressure feeling like something "got stuck in the esophagus" and it reminds the previous pain of esophageal stricture prior to dilatations He was given dilaudid IV with some improvement of pain initially, but now he feels like his pain is coming back.   Review of Systems:  He denied chest pian, palpitations, SOB,nausea, vomiting, back pain. His only complaint was esophageal / upper epigastric pain  ROS As per HPI otherwise 10 point review of systems negative.    Past Medical History: Past Medical History:  Diagnosis Date  . Carotid artery occlusion    40-60% right ICA stenosis (09/2008)  . Chronic back pain    follows with Nsurg  . Chronic back pain   . CLL (chronic lymphoblastic leukemia) dx 2010   Followed at [facility] q34mo, no current  therapy   . Coronary artery disease 2010   s/p CABG '10, sees Dr. Percival Spanish  . Depression   . Diverticulosis   . DVT, lower extremity, recurrent ([place]) 2008, 2009   LLE, chronic anticoag since 2009  . Esophagitis   . Fibromyalgia   . Gallstones   . GERD (gastroesophageal reflux disease)   . Gout   . Gynecomastia, male   . H/O hiatal hernia 2008   surgery  . Hemorrhoids   . Hepatitis A yrs ago  . HIV infection ([place]) dx 1993  . Hypertension   . Impotence of organic origin   . Myocardial infarction 2010    x 2  . Neuromuscular disorder (H[facility])    neuropathy  . Osteoarthritis, knee    s/p B TKA  . Pneumonia mrach, may, july 2017  . Rheumatoid arthritis(714.0) dx 2010   MTX, follows with rheum  . Seasonal allergies   . Secondary syphilis 07/24/14 dx   s/p 2 wks doxy  . Status post dilation of esophageal narrowing   . Stroke ([place]) [place]   . TIA (transient ischemic attack) 1997   mild residual L mouth droop  . Tubular adenoma of colon     Past Surgical History: Past Surgical History:  Procedure Laterality Date  . CHOLECYSTECTOMY    . COLONOSCOPY WITH PROPOFOL N/A 12/28/2012   Procedure: COLONOSCOPY WITH PROPOFOL;  Surgeon: Jerene Bears, MD;  Location: [facility];  Service: Gastroenterology;  Laterality: N/A;  . CORONARY ARTERY BYPASS GRAFT  2010   triple  bypass  . ESOPHAGOGASTRODUODENOSCOPY (EGD) WITH PROPOFOL N/A 12/28/2012   Procedure: ESOPHAGOGASTRODUODENOSCOPY (EGD) WITH PROPOFOL;  Surgeon: Jerene Bears, MD;  Location: [facility];  Service: Gastroenterology;  Laterality: N/A;  . ESOPHAGOGASTRODUODENOSCOPY (EGD) WITH PROPOFOL N/A 03/15/2013   Procedure: ESOPHAGOGASTRODUODENOSCOPY (EGD) WITH PROPOFOL;  Surgeon: Jerene Bears, MD;  Location: [facility];  Service: Gastroenterology;  Laterality: N/A;  . ESOPHAGOGASTRODUODENOSCOPY (EGD) WITH PROPOFOL N/A 02/07/2016   Procedure: ESOPHAGOGASTRODUODENOSCOPY (EGD) WITH PROPOFOL;  Surgeon: Jerene Bears, MD;  Location: [facility];  Service: Gastroenterology;  Laterality: N/A;  . HARDWARE REMOVAL N/A 07/02/2012   Procedure: HARDWARE REMOVAL;  Surgeon: Elaina Hoops, MD;  Location: [place] [facility];  Service: Neurosurgery;  Laterality: N/A;  . HIATAL HERNIA REPAIR     wrap  . INGUINAL HERNIA REPAIR Bilateral   . JOINT REPLACEMENT Left 1999  . KNEE ARTHROPLASTY  07/22/2011   Procedure: COMPUTER ASSISTED TOTAL KNEE ARTHROPLASTY;  Surgeon: Meredith Pel, MD;  Location: [place];  Service: Orthopedics;  Laterality: Right;  Right total knee arthroplasty  . MANDIBLE SURGERY Bilateral    tmj  . REPLACEMENT TOTAL KNEE Bilateral   . ring around testicle hernia reapir  184 and 1986   x 2  . ROTATOR CUFF REPAIR Right   . SHOULDER SURGERY Left   . SPINE SURGERY  2010   "rod and screws", "failed", lopwer spine,   . stent to heart x 1  2010  . TONSILLECTOMY    . UMBILICAL HERNIA REPAIR     x 1  . varicose vein     stripping  . VIDEO BRONCHOSCOPY Bilateral 10/16/2015   Procedure: VIDEO BRONCHOSCOPY WITHOUT FLUORO;  Surgeon: Brand Males, MD;  Location: [facility];  Service: Cardiopulmonary;  Laterality: Bilateral;     Allergies:   Allergies  Allergen Reactions  . Other Anaphylaxis and Hives    Pecan  . Peanut-Containing Drug Products Anaphylaxis, Hives and Swelling    Swelling of throat  . Morphine Other (See Comments)    REACTION: severe headache  . Oxycodone-Acetaminophen Other (See Comments)    REACTION: headache  . Penicillins Rash and Other (See Comments)    FLUSHED, RED  Has patient had a PCN reaction causing immediate rash, facial/tongue/throat swelling, SOB or lightheadedness with hypotension: #  #  #  YES  #  #  #  Has patient had a PCN reaction causing severe rash involving mucus membranes or skin necrosis: No Has patient had a PCN reaction that required hospitalization No Has patient had a PCN reaction occurring within the last 10 years: No If all of the above answers are "NO", then may  proceed with Cephalosporin use  . Promethazine Hcl Other (See Comments)    REACTION: makes him feel drunk     Social History:  reports that he has never smoked. He has never used smokeless tobacco. He reports that he drinks alcohol. He reports that he does not use drugs.   Family History: Family History  Problem Relation Age of Onset  . Breast cancer Mother   . Hypertension Mother   . Hyperlipidemia Mother   . Diabetes Mother   . Prostate cancer Father   . Colon polyps Father   . Hyperlipidemia Father   . Crohn's disease Paternal Aunt   . Diabetes Maternal Grandmother   . Diabetes Brother     x 3  . Heart disease Brother     x 3  . Hyperlipidemia Brother     x  3  . Alcohol abuse Daughter   . Drug abuse Daughter   . Asthma Brother   . Colon cancer Neg Hx     Unacceptable: Noncontributory, unremarkable, or negative. Acceptable: Family history reviewed and not pertinent (If you reviewed it)   Physical Exam: Vitals:   07/04/16 0053 07/04/16 0122 07/04/16 0444 07/04/16 0903  BP: 122/60 130/73 120/68 101/62  Pulse: 88  80 60  Resp: 18  20 18   Temp: 98.5 F (36.9 C)  98 F (36.7 C) 97.9 F (36.6 C)  TempSrc: Oral  Oral Oral  SpO2: 94%  95% 94%  Weight:      Height:        Constitutional: Alert and awake, oriented x3, not in any acute distress, lying flat in bed Eyes: PERLA, EOMI, irises appear normal, anicteric sclera,  ENMT: external ears and nose appear normal. Hearing is intact. Lips appears normal, oropharynx mucosa, tongue, posterior pharynx appear normal  Neck: neck appears normal, no masses, normal ROM, no thyromegaly, no JVD  CVS: S1-S2 clear, no murmur rubs or gallops, no LE edema, normal pedal pulses  Respiratory:  clear to auscultation bilaterally, no wheezing, rales or rhonchi. Respiratory effort normal. No accessory muscle use.  Abdomen: soft, mildly tender in the upper epigastrium - sub xyphoid area, nondistended, normal bowel sounds, no  hepatosplenomegaly, no hernias  Musculoskeletal: : no cyanosis, clubbing or edema noted bilaterally, ulnar deviation of MCP joints noted                       Neuro: Cranial nerves II-XII intact, strength, sensation, reflexes Psych: judgement and insight appear normal, stable mood and affect, mental status Skin: no rashes or lesions or ulcers, no induration or nodules  Data reviewed:  I have personally reviewed following labs and imaging studies  Labs:  CBC:  Recent Labs Lab 07/04/16 0749  WBC 23.1*  NEUTROABS 11.1*  HGB 9.6*  HCT 30.5*  MCV 88.7  PLT [id number]    Basic Metabolic Panel:  Recent Labs Lab 07/04/16 0749  NA 136  K 3.6  CL 101  CO2 28  GLUCOSE 121*  BUN 12  CREATININE 0.88  CALCIUM 8.1*   GFR Estimated Creatinine Clearance: 83.2 mL/min (by C-G formula based on SCr of 0.88 mg/dL).    Microbiology Recent Results (from the past 240 hour(s))  Surgical pcr screen     Status: None   Collection Time: 06/25/16  9:39 AM  Result Value Ref Range Status   MRSA, PCR NEGATIVE NEGATIVE Final   Staphylococcus aureus NEGATIVE NEGATIVE Final    Comment:        The Xpert SA Assay (FDA approved for NASAL specimens in patients over 19 years of age), is one component of a comprehensive surveillance program.  Test performance has been validated by [facility] for patients greater than or equal to 40 year old. It is not intended to diagnose infection nor to guide or monitor treatment.   Aerobic/Anaerobic Culture (surgical/deep wound)     Status: None (Preliminary result)   Collection Time: 07/02/16  1:32 PM  Result Value Ref Range Status   Specimen Description THORACIC  Final   Special Requests SPINE  Final   Gram Stain   Final    FEW WBC PRESENT,BOTH PMN AND MONONUCLEAR NO ORGANISMS SEEN    Culture   Final    NO GROWTH 2 DAYS NO ANAEROBES ISOLATED; CULTURE IN PROGRESS FOR 5 DAYS   Report Status PENDING  Incomplete       Inpatient Medications:   Scheduled  Meds: . aspirin EC  81 mg Oral Daily  . atorvastatin  10 mg Oral QHS  .  ceFAZolin (ANCEF) IV  2 g Intravenous Q8H  . Darunavir Ethanolate  800 mg Oral Q breakfast  . elvitegravir-cobicistat-emtricitabine-tenofovir  1 tablet Oral Q breakfast  . escitalopram  10 mg Oral Daily  . furosemide  20 mg Oral Daily  . loratadine  10 mg Oral Daily  . metoCLOPramide (REGLAN) injection  5 mg Intravenous Q6H  . metoprolol succinate  25 mg Oral QHS  . pantoprazole  40 mg Oral BID AC  . predniSONE  5 mg Oral Q breakfast  . pregabalin  200 mg Oral BID  . sodium chloride flush  3 mL Intravenous Q12H  . sucralfate  1 g Oral TID WC & HS  . sulfamethoxazole-trimethoprim  1 tablet Oral Q12H   Continuous Infusions: . sodium chloride    . dextrose 5 % and 0.45% NaCl 75 mL/hr at 07/03/16 2216  . lactated ringers 10 mL/hr at 07/02/16 0940     Radiological Exams on Admission: Dg Thoracolumabar Spine  Result Date: 07/02/2016 CLINICAL DATA:  Thoracolumbar spine fusion. EXAM: DG C-ARM 61-120 MIN; THORACOLUMBAR SPINE - 2 VIEW COMPARISON:  05/20/2016. FINDINGS: Pedicle screws noted in lower thoracic vertebral bodies. These appear to be intact. lower most screw is noted in a transverse orientation on AP view. Clinical correlation suggested. Two images obtained. 0 minutes 46 seconds fluoroscopy time . IMPRESSION: Pedicle screws noted in  Lower thoracic vertebral bodies as above. Electronically Signed   By: Marcello Moores  Register   On: 07/02/2016 15:18   Dg C-arm 1-60 Min  Result Date: 07/02/2016 CLINICAL DATA:  Thoracolumbar spine fusion. EXAM: DG C-ARM 61-120 MIN; THORACOLUMBAR SPINE - 2 VIEW COMPARISON:  05/20/2016. FINDINGS: Pedicle screws noted in lower thoracic vertebral bodies. These appear to be intact. lower most screw is noted in a transverse orientation on AP view. Clinical correlation suggested. Two images obtained. 0 minutes 46 seconds fluoroscopy time . IMPRESSION: Pedicle screws noted in  Lower thoracic  vertebral bodies as above. Electronically Signed   By: Marcello Moores  Register   On: 07/02/2016 15:18    Impression/Recommendations Principal Problem:   Spinal stenosis of thoracolumbar region Active Problems:   CAD (coronary artery disease) of artery bypass graft   Hyperlipidemia with target LDL less than 100   Hypertension   Erosive esophagitis   GERD (gastroesophageal reflux disease)   Hiatal hernia  Recurrent hiatal hernia in patient with history of previous surgical repair in 2008 in [place] and known esophageal strictures Patient underwent multiple esophageal dilatations and has a history of erosive esophagitis and GERD Will increase Protonix to bid, start Reglan to aid in esophageal motility and add Sucralfate to prevent recurrent erosions If BP allows and sensation of spatic discomfort persists, might try NTG  Will change diet to soft Dictation #1 [id number]  Patient might benefit from GI consult if symptoms unrelieved  HTN BP is on the soft site, continue Lopressor with parameters to hold  CAD, s/p CABG Patient is angina free Continue current meds  Thank you for this consultation.  Our [facility] hospitalist team will follow the patient with you.   Time Spent: 75 minutes  York Grice M.D. [facility] 07/04/2016, 10:28 AM   Attestation of Attending Supervision of Advanced Practitioner (PA/NP): Evaluation and management procedures were performed by the Advanced Practitioner under my supervision and collaboration.  I have seen and examined the patient and have reviewed the Advanced Practitioner's note and chart, and I agree with the management and plan.  Patient's symptoms have resolved currently. Likely secondary to laying flat with hiatal hernia. abd soft, nontender. Will continue with soft foods for now. Advance as able. Obviously, eating in a reclined position will be preferable. Continue home medications.   Loma Boston, DO Attending  Physician [facility]

## 2016-07-04 NOTE — Progress Notes (Signed)
Subjective:  Patient reports Patient doing very well and will back pain no leg pain some upper and epigastric discomfort that waxed and waned throughout the evening spine 2 Maalox Mylanta little Dilaudid  Objective: Vital signs in last 24 hours: Temp:  [97.8 F (36.6 C)-98.7 F (37.1 C)] 98 F (36.7 C) (02/23 0444) Pulse Rate:  [80-94] 80 (02/23 0444) Resp:  [17-20] 20 (02/23 0444) BP: (80-130)/(50-73) 120/68 (02/23 0444) SpO2:  [93 %-95 %] 95 % (02/23 0444)  Intake/Output from previous day: 02/22 0701 - 02/23 0700 In: 1973.9 [P.O.:150; I.V.:1523.9; IV Piggyback:300] Out: 3810 [Urine:3450; Drains:360] Intake/Output this shift: No intake/output data recorded.  Strength out of 5 wound clean dry and intact  Lab Results: No results for input(s): WBC, HGB, HCT, PLT in the last 72 hours. BMET No results for input(s): NA, K, CL, CO2, GLUCOSE, BUN, CREATININE, CALCIUM in the last 72 hours.  Studies/Results: Dg Thoracolumabar Spine  Result Date: 07/02/2016 CLINICAL DATA:  Thoracolumbar spine fusion. EXAM: DG C-ARM 61-120 MIN; THORACOLUMBAR SPINE - 2 VIEW COMPARISON:  05/20/2016. FINDINGS: Pedicle screws noted in lower thoracic vertebral bodies. These appear to be intact. lower most screw is noted in a transverse orientation on AP view. Clinical correlation suggested. Two images obtained. 0 minutes 46 seconds fluoroscopy time . IMPRESSION: Pedicle screws noted in  Lower thoracic vertebral bodies as above. Electronically Signed   By: Marcello Moores  Register   On: 07/02/2016 15:18   Dg C-arm 1-60 Min  Result Date: 07/02/2016 CLINICAL DATA:  Thoracolumbar spine fusion. EXAM: DG C-ARM 61-120 MIN; THORACOLUMBAR SPINE - 2 VIEW COMPARISON:  05/20/2016. FINDINGS: Pedicle screws noted in lower thoracic vertebral bodies. These appear to be intact. lower most screw is noted in a transverse orientation on AP view. Clinical correlation suggested. Two images obtained. 0 minutes 46 seconds fluoroscopy time .  IMPRESSION: Pedicle screws noted in  Lower thoracic vertebral bodies as above. Electronically Signed   By: Marcello Moores  Register   On: 07/02/2016 15:18    Assessment/Plan: Posterior day to track lumbar fusion doing fairly well continue flat bedrest will take out his back drain and flat for another 24-48 hours mobilized him when he is not having any headache. He has a slight headache this morning. We'll consult the [facility]  LOS: 2 days     Christyl Osentoski P 07/04/2016, 7:34 AM

## 2016-07-04 NOTE — Clinical Social Work Note (Signed)
CSW acknowledges SNF consult. Patient on bedrest orders for another 24-48 hours. Will follow progress for PT/OT evaluations and recommendations.  [name], [place]

## 2016-07-04 NOTE — Progress Notes (Signed)
Hemovac removed. Dry dressing applied. Pt denied pain or discomfort.

## 2016-07-05 ENCOUNTER — Inpatient Hospital Stay (HOSPITAL_COMMUNITY): Payer: Medicare Other

## 2016-07-05 DIAGNOSIS — M4805 Spinal stenosis, thoracolumbar region: Secondary | ICD-10-CM

## 2016-07-05 MED ORDER — SENNA 8.6 MG PO TABS
1.0000 | ORAL_TABLET | Freq: Two times a day (BID) | ORAL | Status: DC
  Administered 2016-07-05 – 2016-07-07 (×4): 8.6 mg via ORAL
  Filled 2016-07-05 (×4): qty 1

## 2016-07-05 MED ORDER — DOCUSATE SODIUM 100 MG PO CAPS
100.0000 mg | ORAL_CAPSULE | Freq: Two times a day (BID) | ORAL | Status: DC
  Administered 2016-07-05 – 2016-07-07 (×4): 100 mg via ORAL
  Filled 2016-07-05 (×4): qty 1

## 2016-07-05 NOTE — Evaluation (Addendum)
Physical Therapy Evaluation Patient Details Name: [name] MRN: [id number] DOB: 1951/02/10 Today's Date: 07/05/2016   History of Present Illness  Pt s/p T10-L2 fusion. PMH - multiple back surgeries, HIV, CLL, Bil TKR, CVA, CABG, HTN, rt rotator cuff tear  Clinical Impression  Pt admitted with above diagnosis and presents to PT with functional limitations due to deficits listed below (See PT problem list). Pt needs skilled PT to maximize independence and safety to allow discharge to ST-SNF. Pt lives alone and will need to be modified independent to return there. Pt motivated to work toward independence and is very pleased with pain reduction and notes that he is taller and straighter. Pt with initial light headedness when coming to sit due to several days of bedrest. After amb he sat in chair x 10 minutes prior to returning to bed. Pt denied any headache with upright position.     Follow Up Recommendations SNF    Equipment Recommendations  None recommended by PT    Recommendations for Other Services OT consult     Precautions / Restrictions Precautions Precautions: Back;Fall Required Braces or Orthoses: Spinal Brace Spinal Brace: Lumbar corset;Applied in sitting position (more for comfort per nursing) Restrictions Weight Bearing Restrictions: No      Mobility  Bed Mobility Overal bed mobility: Needs Assistance Bed Mobility: Rolling;Sidelying to Sit;Sit to Sidelying Rolling: Min guard Sidelying to sit: Min assist     Sit to sidelying: Min guard General bed mobility comments: Assist to elevate trunk into sitting  Transfers Overall transfer level: Needs assistance Equipment used: 4-wheeled walker Transfers: Sit to/from Stand Sit to Stand: Min assist         General transfer comment: Assist to bring hips up and for balance  Ambulation/Gait Ambulation/Gait assistance: Min assist Ambulation Distance (Feet): 90 Feet Assistive device: 4-wheeled walker Gait  Pattern/deviations: Step-through pattern;Decreased stride length;Trunk flexed;Narrow base of support;Drifts right/left Gait velocity: decr Gait velocity interpretation: Below normal speed for age/gender General Gait Details: Assist for balance and support. Verbal cues to stand more erect. As distance incr pt became more flexed  Stairs            Wheelchair Mobility    Modified Rankin (Stroke Patients Only)       Balance Overall balance assessment: Needs assistance Sitting-balance support: No upper extremity supported;Feet supported Sitting balance-[name]: Fair     Standing balance support: Bilateral upper extremity supported Standing balance-[name]: Poor Standing balance comment: rollator and min A for static standing                             Pertinent Vitals/Pain Pain Assessment: 0-10 Pain Score: 2  Pain Location: back Pain Descriptors / Indicators: Operative site guarding Pain Intervention(s): Limited activity within patient's tolerance;Premedicated before session    Home Living Family/patient expects to be discharged to:: Private residence Living Arrangements: Alone Available Help at Discharge: Friend(s) Type of Home: Mobile home Home Access: Ramped entrance     Home Layout: One level Home Equipment: Environmental consultant - 4 wheels;Shower seat      Prior Function Level of Independence: Independent with assistive device(s)               Hand Dominance        Extremity/Trunk Assessment   Upper Extremity Assessment Upper Extremity Assessment: RUE deficits/detail;Generalized weakness RUE Deficits / Details: Poor shoulder strength and ROM due to rotator cuff issues    Lower Extremity Assessment  Lower Extremity Assessment: RLE deficits/detail;LLE deficits/detail;Generalized weakness RLE Sensation: history of peripheral neuropathy LLE Sensation: history of peripheral neuropathy       Communication   Communication: No difficulties   Cognition Arousal/Alertness: Awake/alert Behavior During Therapy: WFL for tasks assessed/performed Overall Cognitive Status: Within Functional Limits for tasks assessed                      General Comments      Exercises     Assessment/Plan    PT Assessment Patient needs continued PT services  PT Problem List Decreased strength;Decreased activity tolerance;Decreased balance;Decreased mobility       PT Treatment Interventions DME instruction;Gait training;Functional mobility training;Therapeutic activities;Therapeutic exercise;Balance training;Patient/family education    PT Goals (Current goals can be found in the Care Plan section)  Acute Rehab PT Goals Patient Stated Goal: go to rehab and then home PT Goal Formulation: With patient Time For Goal Achievement: 07/12/16 Potential to Achieve Goals: Good    Frequency Min 5X/week   Barriers to discharge Decreased caregiver support Lives alone    Co-evaluation               End of Session Equipment Utilized During Treatment: Gait belt;Back brace Activity Tolerance: Patient tolerated treatment well Patient left: in bed;with call bell/phone within reach;with bed alarm set;with family/visitor present Nurse Communication: Mobility status PT Visit Diagnosis: Other abnormalities of gait and mobility (R26.89)         Time: 1540-1620 (Pt sat in chair for 10 minutes while I typed up eval) PT Time Calculation (min) (ACUTE ONLY): 40 min   Charges:   PT Evaluation $PT Eval Moderate Complexity: 1 Procedure PT Treatments $Gait Training: 8-22 mins   PT G CodesShary Decamp [facility] 07/15/16, 4:38 PM [employer] PT [phone]

## 2016-07-05 NOTE — Progress Notes (Addendum)
Consult PROGRESS NOTE                                                                                                                                                                                                             Patient Demographics:    [name], is a 66 y.o. male, DOB - April 11, 1951, [id number]  Admit date - 07/02/2016   Admitting Physician Kary Kos, MD  Outpatient Primary MD for the patient is [name], MD  LOS - 3  No chief complaint on file.      Brief Narrative  [name] is an 66 y.o. male HIV hepatitis, chronic renal disease, CLL, PVD with right ICA stenosis, CAD, status post CABG 3, DVT of the left lower extremity diagnosed in 2009, esophagitis with GERD status post multiple esophageal dilatations, hiatal hernia with remote surgical treatment, hypertension, rheumatoid arthritis, secondary syphilis, stroke who underwent complex T12-L2 spinal surgery by Dr. Saintclair Halsted on 07/02/2016.  Medicine team was consulted for transient esophageal and epigastric pain which arose from patient eating and hamburger which got stuck in his throat, he usually eats soft diet.   Subjective:    [name] today has, No headache, No chest pain, No abdominal pain - No Nausea, No new weakness tingling or numbness, No Cough - SOB. Back pain much improved.   Assessment  & Plan :     1. Transient esophageal and epigastric pain which arose from patient eating and hamburger which got stuck in his throat, he usually eats soft diet. Has history of his aphasia strictures along with esophageal dilations and surgical intervention in the remote past. This was transient due to eating not his usual soft diet. Symptoms have completely resolved, he is symptom free without any dysphagia or odynophagia on soft diet which will be continued.  2. HTN - continue low-dose better blocker and  Lasix. Monitor blood pressures closely.  3. Dyslipidemia. On statin.  4. CAD. Chest pain free no acute issues continue aspirin, statin and beta blocker for secondary prevention.  5. GERD. On PPI.  6. CLL - with chronic leukocytosis, no acute issues follow with PCP and primary oncologist postdischarge. Certolizumab Pegol 1/month.  7. HIV - on GENVOYA follows with Dr  Hatcher.  8. Hx of recurrent DVT - was on Savaysa, placed on SCDs for now we'll request primary team to consider resuming chemical prophylaxis once surgically stable. I have also called the RN on for this patient on 07/05/2016 to inform the primary team of the same.     Diet : DIET SOFT Room service appropriate? Yes; Fluid consistency: Thin    DVT Prophylaxis  :  Per primary team, ordered SCDs  Lab Results  Component Value Date   PLT 150 07/04/2016    Inpatient Medications  Scheduled Meds: . aspirin EC  81 mg Oral Daily  . atorvastatin  10 mg Oral QHS  .  ceFAZolin (ANCEF) IV  2 g Intravenous Q8H  . Darunavir Ethanolate  800 mg Oral Q breakfast  . elvitegravir-cobicistat-emtricitabine-tenofovir  1 tablet Oral Q breakfast  . escitalopram  10 mg Oral Daily  . furosemide  20 mg Oral Daily  . loratadine  10 mg Oral Daily  . metoCLOPramide (REGLAN) injection  5 mg Intravenous Q6H  . metoprolol succinate  25 mg Oral QHS  . pantoprazole  40 mg Oral BID AC  . predniSONE  5 mg Oral Q breakfast  . pregabalin  200 mg Oral BID  . sodium chloride flush  3 mL Intravenous Q12H  . sucralfate  1 g Oral TID WC & HS  . sulfamethoxazole-trimethoprim  1 tablet Oral Q12H   Continuous Infusions: . sodium chloride    . dextrose 5 % and 0.45% NaCl 75 mL/hr at 07/05/16 0205  . lactated ringers 10 mL/hr at 07/02/16 0940   PRN Meds:.acetaminophen **OR** acetaminophen, acetaminophen **OR** acetaminophen, alum & mag hydroxide-simeth, cyclobenzaprine, HYDROmorphone (DILAUDID) injection, HYDROmorphone, menthol-cetylpyridinium **OR**  phenol, methocarbamol, mometasone, ondansetron (ZOFRAN) IV, ondansetron, oxyCODONE, polyethylene glycol, sodium chloride flush  Antibiotics  :    Anti-infectives    Start     Dose/Rate Route Frequency Ordered Stop   07/03/16 0800  Darunavir Ethanolate (PREZISTA) tablet 800 mg     800 mg Oral Daily with breakfast 07/02/16 1755     07/03/16 0800  elvitegravir-cobicistat-emtricitabine-tenofovir (GENVOYA) 150-150-200-10 MG tablet 1 tablet     1 tablet Oral Daily with breakfast 07/02/16 1755     07/02/16 2200  sulfamethoxazole-trimethoprim (BACTRIM DS,SEPTRA DS) 800-160 MG per tablet 1 tablet     1 tablet Oral Every 12 hours 07/02/16 1755     07/02/16 1900  ceFAZolin (ANCEF) IVPB 2g/100 mL premix     2 g 200 mL/hr over 30 Minutes Intravenous Every 8 hours 07/02/16 1755 07/07/16 1859   07/02/16 1442  vancomycin (VANCOCIN) powder  Status:  Discontinued       As needed 07/02/16 1443 07/02/16 1628   07/02/16 1325  bacitracin 50,000 Units in sodium chloride irrigation 0.9 % 500 mL irrigation  Status:  Discontinued       As needed 07/02/16 1325 07/02/16 1628         Objective:   Vitals:   07/04/16 1741 07/04/16 2155 07/05/16 0128 07/05/16 0546  BP: (!) 99/55 (!) 94/42 (!) 100/53 (!) 105/58  Pulse: 87 73 70 70  Resp: 18 18 16 16   Temp: 98.3 F (36.8 C) 98 F (36.7 C) 97.7 F (36.5 C) 98.2 F (36.8 C)  TempSrc: Oral Oral Oral Oral  SpO2: 95% 97% 98% 94%  Weight:      Height:        Wt Readings from Last 3 Encounters:  07/02/16 83.9 kg (185 lb)  06/25/16 83.9 kg (185 lb)  06/02/16 86.1 kg (189 lb 12 oz)     Intake/Output Summary (Last 24 hours) at 07/05/16 1016 Last data filed at 07/05/16 0547  Gross per 24 hour  Intake              120 ml  Output             3000 ml  Net            -2880 ml     Physical Exam  Awake Alert, Oriented X 3, No new F.N deficits, Normal affect [place].AT,PERRAL Supple Neck,No JVD, No cervical lymphadenopathy appriciated.  Symmetrical Chest wall  movement, Good air movement bilaterally, CTAB RRR,No Gallops,Rubs or new Murmurs, No Parasternal Heave +ve B.Sounds, Abd Soft, No tenderness, No organomegaly appriciated, No rebound - guarding or rigidity. No Cyanosis, Clubbing or edema, No new Rash or bruise    Data Review:    CBC  Recent Labs Lab 07/04/16 0749  WBC 23.1*  HGB 9.6*  HCT 30.5*  PLT 150  MCV 88.7  MCH 27.9  MCHC 31.5  RDW 17.3*  LYMPHSABS 10.6*  MONOABS 1.4*  EOSABS 0.0  BASOSABS 0.0    Chemistries   Recent Labs Lab 07/04/16 0749  NA 136  K 3.6  CL 101  CO2 28  GLUCOSE 121*  BUN 12  CREATININE 0.88  CALCIUM 8.1*   ------------------------------------------------------------------------------------------------------------------ No results for input(s): CHOL, HDL, LDLCALC, TRIG, CHOLHDL, LDLDIRECT in the last 72 hours.  Lab Results  Component Value Date   HGBA1C 5.2 12/18/2015   ------------------------------------------------------------------------------------------------------------------ No results for input(s): TSH, T4TOTAL, T3FREE, THYROIDAB in the last 72 hours.  Invalid input(s): FREET3 ------------------------------------------------------------------------------------------------------------------ No results for input(s): VITAMINB12, FOLATE, FERRITIN, TIBC, IRON, RETICCTPCT in the last 72 hours.  Coagulation profile No results for input(s): INR, PROTIME in the last 168 hours.  No results for input(s): DDIMER in the last 72 hours.  Cardiac Enzymes No results for input(s): CKMB, TROPONINI, MYOGLOBIN in the last 168 hours.  Invalid input(s): CK ------------------------------------------------------------------------------------------------------------------    Component Value Date/Time   BNP 164.7 (H) 04/20/2015 1153   BNP 110.0 (H) 07/24/2014 0943    Micro Results Recent Results (from the past 240 hour(s))  Fungus Culture With Stain     Status: None (Preliminary result)    Collection Time: 07/02/16  1:32 PM  Result Value Ref Range Status   Fungus Stain Final report  Final    Comment: (NOTE) Performed At: [facility] [place], [place] [id number] Lindon Romp MD [id number]    Fungus (Mycology) Culture PENDING  Incomplete   Fungal Source THORACIC SPINE   Final  Aerobic/Anaerobic Culture (surgical/deep wound)     Status: None (Preliminary result)   Collection Time: 07/02/16  1:32 PM  Result Value Ref Range Status   Specimen Description THORACIC  Final   Special Requests SPINE  Final   Gram Stain   Final    FEW WBC PRESENT,BOTH PMN AND MONONUCLEAR NO ORGANISMS SEEN    Culture   Final    NO GROWTH 2 DAYS NO ANAEROBES ISOLATED; CULTURE IN PROGRESS FOR 5 DAYS   Report Status PENDING  Incomplete  Fungus Culture Result     Status: None   Collection Time: 07/02/16  1:32 PM  Result Value Ref Range Status   Result 1 Comment  Final    Comment: (NOTE) KOH/Calcofluor preparation:  no fungus observed. Performed At: [facility] [place], [place] [id number] Lindon Romp MD [id number]  Radiology Reports Dg Thoracolumabar Spine  Result Date: 07/02/2016 CLINICAL DATA:  Thoracolumbar spine fusion. EXAM: DG C-ARM 61-120 MIN; THORACOLUMBAR SPINE - 2 VIEW COMPARISON:  05/20/2016. FINDINGS: Pedicle screws noted in lower thoracic vertebral bodies. These appear to be intact. lower most screw is noted in a transverse orientation on AP view. Clinical correlation suggested. Two images obtained. 0 minutes 46 seconds fluoroscopy time . IMPRESSION: Pedicle screws noted in  Lower thoracic vertebral bodies as above. Electronically Signed   By: Marcello Moores  Register   On: 07/02/2016 15:18   Dg C-arm 1-60 Min  Result Date: 07/02/2016 CLINICAL DATA:  Thoracolumbar spine fusion. EXAM: DG C-ARM 61-120 MIN; THORACOLUMBAR SPINE - 2 VIEW COMPARISON:  05/20/2016. FINDINGS: Pedicle screws noted in lower thoracic vertebral bodies.  These appear to be intact. lower most screw is noted in a transverse orientation on AP view. Clinical correlation suggested. Two images obtained. 0 minutes 46 seconds fluoroscopy time . IMPRESSION: Pedicle screws noted in  Lower thoracic vertebral bodies as above. Electronically Signed   By: Marcello Moores  Register   On: 07/02/2016 15:18    Time Spent in minutes  30   Lala Lund K M.D on 07/05/2016 at 10:16 AM  Between 7am to 7pm - Pager - [phone]  After 7pm go to [email or web address] - password [facility]  [facility] -  Office  [phone]

## 2016-07-05 NOTE — NC FL2 (Signed)
[place] LEVEL OF CARE SCREENING TOOL     IDENTIFICATION  Patient Name: [name] Birthdate: 02-09-1951 Sex: male Admission Date (Current Location): 07/02/2016  [facility] and [place] Number:  Herbalist and Address:  [facility] [place]. [facility], [place] [address] [place], [place] [address]      Provider Number: [id number]  Attending Physician Name and Address:  Kary Kos, MD  Relative Name and Phone Number:       Current Level of Care: Hospital Recommended Level of Care: [place] Prior Approval Number:    Date Approved/Denied:   PASRR Number: [id number]  Discharge Plan: SNF    Current Diagnoses: Patient Active Problem List   Diagnosis Date Noted  . Hiatal hernia 07/04/2016  . Spinal stenosis of thoracolumbar region 07/02/2016  . Incidental lung nodule, > 60mm and < 79mm 05/14/2016  . Chronic low back pain with sciatica 05/13/2016  . Intractable back pain 05/13/2016  . Gait instability 03/27/2016  . Cataracta 03/27/2016  . ILD (interstitial lung disease) ([place]) 09/13/2015  . Immunocompromised state ([place]) 09/13/2015  . Tinea cruris 09/12/2015  . Pressure ulcer 07/11/2015  . PCP (pneumocystis jiroveci pneumonia) ([place]) 06/18/2015  . Postinflammatory pulmonary fibrosis ([place]) 05/11/2015  . Secondary syphilis of skin 07/24/2014  . Insomnia 11/11/2013  . GERD (gastroesophageal reflux disease)   . Hereditary and idiopathic peripheral neuropathy 09/08/2013  . Erosive esophagitis 12/28/2012  . Depression 11/16/2012  . Allergic rhinitis, cause unspecified 04/28/2012  . Long term current use of anticoagulant therapy 02/03/2012  . Hypertension   . DVT, lower extremity, recurrent ([place])   . Osteoarthritis, knee   . Gout   . Chronic back pain   . Gynecomastia, male 11/19/2011  . Carotid artery occlusion   . Hyperlipidemia with target LDL less than 100   . HIV disease ([place]) 04/08/2011  . Rheumatoid arthritis ([place]) 04/08/2011   . Chronic lymphoblastic leukemia ([place]) 04/08/2011  . Impotence of organic origin 04/02/2011  . CAD (coronary artery disease) of artery bypass graft 02/14/2009    Orientation RESPIRATION BLADDER Height & Weight     Self, Time, Situation, Place  Normal Continent Weight: 185 lb (83.9 kg) Height:  5\' 7"  (170.2 cm)  BEHAVIORAL SYMPTOMS/MOOD NEUROLOGICAL BOWEL NUTRITION STATUS      Continent Diet (regular)  AMBULATORY STATUS COMMUNICATION OF NEEDS Skin   Extensive Assist Verbally Surgical wounds                       Personal Care Assistance Level of Assistance  Bathing, Feeding, Dressing Bathing Assistance: Limited assistance Feeding assistance: Independent Dressing Assistance: Limited assistance     Functional Limitations Info  Sight, Hearing, Speech Sight Info: Adequate Hearing Info: Adequate Speech Info: Adequate    SPECIAL CARE FACTORS FREQUENCY  PT (By licensed PT), OT (By licensed OT)     PT Frequency: 5x OT Frequency: 5x            Contractures Contractures Info: Not present    Additional Factors Info  Code Status, Allergies Code Status Info: Full Code Allergies Info: Other, Peanut-containing Drug Products, Morphine, Oxycodone-acetaminophen, Penicillins, Promethazine Hcl           Current Medications (07/05/2016):  This is the current hospital active medication list Current Facility-Administered Medications  Medication Dose Route Frequency Provider Last Rate Last Dose  . 0.9 %  sodium chloride infusion  250 mL Intravenous Continuous Kary Kos, MD   Stopped at 07/05/16  1359  . acetaminophen (TYLENOL) tablet 650 mg  650 mg Oral Q4H PRN Kary Kos, MD       Or  . acetaminophen (TYLENOL) suppository 650 mg  650 mg Rectal Q4H PRN Kary Kos, MD      . acetaminophen (TYLENOL) tablet 650 mg  650 mg Oral Q6H PRN Kary Kos, MD       Or  . acetaminophen (TYLENOL) suppository 650 mg  650 mg Rectal Q6H PRN Kary Kos, MD      . alum & mag hydroxide-simeth  (MAALOX/MYLANTA) 200-200-20 MG/5ML suspension 30 mL  30 mL Oral Q6H PRN Kary Kos, MD   30 mL at 07/03/16 2100  . aspirin EC tablet 81 mg  81 mg Oral Daily Kary Kos, MD   81 mg at 07/05/16 [id number]  . atorvastatin (LIPITOR) tablet 10 mg  10 mg Oral QHS Kary Kos, MD   10 mg at 07/04/16 2220  . ceFAZolin (ANCEF) IVPB 2g/100 mL premix  2 g Intravenous Q8H Kary Kos, MD   2 g at 07/05/16 1144  . cyclobenzaprine (FLEXERIL) tablet 10 mg  10 mg Oral TID PRN Kary Kos, MD      . Darunavir Ethanolate (PREZISTA) tablet 800 mg  800 mg Oral Q breakfast Kary Kos, MD   800 mg at 07/05/16 0924  . elvitegravir-cobicistat-emtricitabine-tenofovir (GENVOYA) 150-150-200-10 MG tablet 1 tablet  1 tablet Oral Q breakfast Kary Kos, MD   1 tablet at 07/05/16 [phone]  . escitalopram (LEXAPRO) tablet 10 mg  10 mg Oral Daily Kary Kos, MD   10 mg at 07/05/16 [id number]  . furosemide (LASIX) tablet 20 mg  20 mg Oral Daily Kary Kos, MD   20 mg at 07/05/16 [id number]  . HYDROmorphone (DILAUDID) injection 1 mg  1 mg Intravenous Q2H PRN Kary Kos, MD   1 mg at 07/04/16 1028  . HYDROmorphone (DILAUDID) tablet 2 mg  2 mg Oral Q6H PRN Kary Kos, MD   2 mg at 07/05/16 1546  . lactated ringers infusion   Intravenous Continuous Duane Boston, MD 10 mL/hr at 07/02/16 0940    . loratadine (CLARITIN) tablet 10 mg  10 mg Oral Daily Kary Kos, MD   10 mg at 07/05/16 [id number]  . menthol-cetylpyridinium (CEPACOL) lozenge 3 mg  1 lozenge Oral PRN Kary Kos, MD       Or  . phenol (CHLORASEPTIC) mouth spray 1 spray  1 spray Mouth/Throat PRN Kary Kos, MD      . methocarbamol (ROBAXIN) tablet 500 mg  500 mg Oral Q8H PRN Kary Kos, MD      . metoCLOPramide [facility]) injection 5 mg  5 mg Intravenous Q6H Brenton Grills, PA-C   5 mg at 07/05/16 1353  . metoprolol succinate (TOPROL-XL) 24 hr tablet 25 mg  25 mg Oral QHS Brenton Grills, PA-C   25 mg at 07/04/16 2219  . mometasone (NASONEX) nasal spray 2 spray  2 spray Nasal Daily PRN Kary Kos, MD      .  ondansetron [facility] injection 4 mg  4 mg Intravenous Q4H PRN Kary Kos, MD   4 mg at 07/03/16 2216  . ondansetron (ZOFRAN-ODT) disintegrating tablet 4 mg  4 mg Oral Q8H PRN Kary Kos, MD      . oxyCODONE (Oxy IR/ROXICODONE) immediate release tablet 5-10 mg  5-10 mg Oral Q3H PRN Kary Kos, MD   5 mg at 07/03/16 0220  . pantoprazole (PROTONIX) EC tablet 40 mg  40 mg Oral BID AC  Brenton Grills, PA-C   40 mg at 07/05/16 [id number]  . polyethylene glycol (MIRALAX / GLYCOLAX) packet 17 g  17 g Oral Daily PRN Kary Kos, MD   17 g at 07/05/16 [id number]  . predniSONE (DELTASONE) tablet 5 mg  5 mg Oral Q breakfast Kary Kos, MD   5 mg at 07/05/16 [id number]  . pregabalin (LYRICA) capsule 200 mg  200 mg Oral BID Kary Kos, MD   200 mg at 07/05/16 [id number]  . sodium chloride flush (NS) 0.9 % injection 3 mL  3 mL Intravenous Q12H Kary Kos, MD   3 mL at 07/04/16 2221  . sodium chloride flush (NS) 0.9 % injection 3 mL  3 mL Intravenous PRN Kary Kos, MD      . sucralfate (CARAFATE) 1 GM/10ML suspension 1 g  1 g Oral TID WC & HS Brenton Grills, PA-C   1 g at 07/05/16 1352  . sulfamethoxazole-trimethoprim (BACTRIM DS,SEPTRA DS) 800-160 MG per tablet 1 tablet  1 tablet Oral Q12H Kary Kos, MD   1 tablet at 07/05/16 [id number]     Discharge Medications: Please see discharge summary for a list of discharge medications.  Relevant Imaging Results:  Relevant Lab Results:   Additional Information SSN: [id number]  [place], [place]

## 2016-07-05 NOTE — Progress Notes (Signed)
X-ray tech told patient that his doctor ordered the X-ray d/t his SOB. Pt refused the chest X-ray stating that putting the X-ray board under him is causing him too much discomfort and he is not short of breath.  MD notification is in process.

## 2016-07-05 NOTE — Progress Notes (Signed)
Patient ID: [name], male   DOB: 27-Jan-1951, 66 y.o.   MRN: [id number] Subjective: Patient reports no pain at all  Objective: Vital signs in last 24 hours: Temp:  [97.7 F (36.5 C)-98.3 F (36.8 C)] 98.2 F (36.8 C) (02/24 0546) Pulse Rate:  [60-87] 70 (02/24 0546) Resp:  [16-18] 16 (02/24 0546) BP: (94-105)/(42-62) 105/58 (02/24 0546) SpO2:  [94 %-98 %] 94 % (02/24 0546)  Intake/Output from previous day: 02/23 0701 - 02/24 0700 In: 120 [P.O.:120] Out: 3000 [Urine:3000] Intake/Output this shift: No intake/output data recorded.  Neurologic: Grossly normal  Lab Results: Lab Results  Component Value Date   WBC 23.1 (H) 07/04/2016   HGB 9.6 (L) 07/04/2016   HCT 30.5 (L) 07/04/2016   MCV 88.7 07/04/2016   PLT 150 07/04/2016   Lab Results  Component Value Date   INR 0.97 06/25/2016   BMET Lab Results  Component Value Date   NA 136 07/04/2016   K 3.6 07/04/2016   CL 101 07/04/2016   CO2 28 07/04/2016   GLUCOSE 121 (H) 07/04/2016   BUN 12 07/04/2016   CREATININE 0.88 07/04/2016   CALCIUM 8.1 (L) 07/04/2016    Studies/Results: No results found.  Assessment/Plan: Doing well, mobilize today   LOS: 3 days    Khamiya Varin S 07/05/2016, 7:59 AM

## 2016-07-05 NOTE — Clinical Social Work Placement (Addendum)
   CLINICAL SOCIAL WORK PLACEMENT  NOTE  Date:  07/05/2016  Patient Details  Name: [name] MRN: [id number] Date of Birth: 28-Apr-1951  Clinical Social Work is seeking post-discharge placement for this patient at the [place] level of care (*CSW will initial, date and re-position this form in  chart as items are completed):  Yes   Patient/family provided with [facility] Work Department's list of facilities offering this level of care within the geographic area requested by the patient (or if unable, by the patient's family).  Yes   Patient/family informed of their freedom to choose among providers that offer the needed level of care, that participate in Medicare, Medicaid or managed care program needed by the patient, have an available bed and are willing to accept the patient.  Yes   Patient/family informed of [facility]'s ownership interest in [facility] and [facility], as well as of the fact that they are under no obligation to receive care at these facilities.  PASRR submitted to EDS on       PASRR number received on       Existing PASRR number confirmed on 07/05/16     FL2 transmitted to all facilities in geographic area requested by pt/family on 07/05/16     FL2 transmitted to all facilities within larger geographic area on       Patient informed that his/her managed care company has contracts with or will negotiate with certain facilities, including the following:            Patient/family informed of bed offers received.  Patient chooses bed at [facility]     Physician recommends and patient chooses bed at      Patient to be transferred to [facility] on 07/07/16.  Patient to be transferred to facility by [facility]     Patient family notified on 07/07/16 of transfer.  Name of family member notified:  Tricia     PHYSICIAN Please sign FL2     Additional Comment:     _______________________________________________ Lilly Cove, LCSW 07/05/2016, 3:53 PM

## 2016-07-05 NOTE — Progress Notes (Signed)
Orthopedic Tech Progress Note Patient Details:  [name] [birth year] [id number]  Patient ID: [name], male   DOB: May 05, 1951, 66 y.o.   MRN: [id number]   [name] 07/05/2016, 9:57 [facility] [employer] for lumbar brace.

## 2016-07-05 NOTE — Clinical Social Work Note (Signed)
Clinical Social Work Assessment  Patient Details  Name: [name] MRN: [id number] Date of Birth: 08/06/1950  Date of referral:  07/05/16               Reason for consult:  Facility Placement, Discharge Planning                Permission sought to share information with:  Case Manager, Customer service manager, Family Supports Permission granted to share information::  Yes, Verbal Permission Granted  Name::        Agency::  [facility]  Relationship::  Daughter involved  Contact Information:     Housing/Transportation Living arrangements for the past 2 months:  Apartment Source of Information:  Patient, Medical Team, Case Manager Patient Interpreter Needed:  None Criminal Activity/Legal Involvement Pertinent to Current Situation/Hospitalization:  No - Comment as needed Significant Relationships:  Adult Children, Other Family Members, Community Support Lives with:  Self Do you feel safe going back to the place where you live?  No Need for family participation in patient care:  Yes (Comment)  Care giving concerns:  Patient reports he lives alone with his daughter checking in on him and his care. Patient reports he recently had back surgery and has had back surgery in the past since 2010.  At this time he is wanting short term rehab and his daughter has been helping him with placement. Daughter has been by [facility] per report and discussed options and treatment with staff.  PT has yet to see patient, however LCSW will work patient up for SNF as patient reports he is unable to return home in this condition. Patient has been to SNF 2 times in past most recent was [facility] in Jan. 2018, but had a poor experience and lasted only 2 days.  Patient reports he is also 042 positive, but active in his ID with clinic and MD: [name].  He reports he is undetectable and also involved in [employer] with the [employer].   Social Worker assessment / plan:   Assessment completed and consult for SNF placement. PT pending, but will assume SNF per patient report and past history.  Work up completed for patient. Patient hopeful for [facility]. Patient aware to bring his medications for 042 to facility as expenses are high.  SNF work up completed. Will follow up with bed offers.  Employment status:  Disabled (Comment on whether or not currently receiving Disability) Insurance information:  Medicare, Medicaid In [place] PT Recommendations:  [place] / Referral to community resources:  [place]  Patient/Family's Response to care:  Agreeable to plan  Patient/Family's Understanding of and Emotional Response to Diagnosis, Current Treatment, and Prognosis:  Patient able to voice understanding and limitations with discharge home vs SNF. Understanding of need for rehab and supervision at DC.  Emotional Assessment Appearance:  Appears stated age Attitude/Demeanor/Rapport:    Affect (typically observed):  Accepting, Adaptable, Pleasant Orientation:  Oriented to Self, Oriented to Place, Oriented to  Time, Oriented to Situation Alcohol / Substance use:  Not Applicable Psych involvement (Current and /or in the community):  No (Comment)  Discharge Needs  Concerns to be addressed:  No discharge needs identified Readmission within the last 30 days:  No Current discharge risk:  None Barriers to Discharge:  No Barriers Identified, Continued Medical Work up   Christopher Cove, LCSW 07/05/2016, 3:44 PM

## 2016-07-06 MED ORDER — METOPROLOL SUCCINATE ER 25 MG PO TB24: 12.5000 mg | ORAL_TABLET | Freq: Every day | ORAL | Status: DC

## 2016-07-06 NOTE — Progress Notes (Signed)
Patient foley removed this AM. Patient ambulated approximately 20 yards in hallway with one assist of RN, rolling walker, and brace.  No complaints, patient sitting in recliner upon return to room. Continue to monitor patient.

## 2016-07-06 NOTE — Progress Notes (Addendum)
Pt seen and examined.  No issues overnight. Denies any pain Tolerating po Up ambulating Denies bowel or bladder dysfunction  EXAM: Temp:  [97.8 F (36.6 C)-98.3 F (36.8 C)] 98.3 F (36.8 C) (02/25 0519) Pulse Rate:  [54-81] 81 (02/25 0519) Resp:  [18-20] 18 (02/25 0519) BP: (98-116)/(52-62) 109/57 (02/25 0519) SpO2:  [95 %-98 %] 98 % (02/25 0519) Intake/Output      02/24 0701 - 02/25 0700 02/25 0701 - 02/26 0700   P.O. 360    IV Piggyback 600    Total Intake(mL/kg) 960 (11.4)    Urine (mL/kg/hr) 1825 (0.9)    Total Output 1825     Net -865           Awake and alert Follows commands throughout Full strength CN grossly intact Wound c/d/i  Plan Appears to be doing well Is without concerns Dispo planning- Aim for tomorrow d/c to SNF

## 2016-07-06 NOTE — Progress Notes (Signed)
Consult PROGRESS NOTE                                                                                                                                                                                                             Patient Demographics:    [name], is a 66 y.o. male, DOB - [birth year], [id number]  Admit date - 07/02/2016   Admitting Physician Kary Kos, MD  Outpatient Primary MD for the patient is [name], MD  LOS - 4  No chief complaint on file.      Brief Narrative  [name] is an 66 y.o. male HIV hepatitis, chronic renal disease, CLL, PVD with right ICA stenosis, CAD, status post CABG 3, DVT of the left lower extremity diagnosed in 2009, esophagitis with GERD status post multiple esophageal dilatations, hiatal hernia with remote surgical treatment, hypertension, rheumatoid arthritis, secondary syphilis, stroke who underwent complex T12-L2 spinal surgery by Dr. Saintclair Halsted on 07/02/2016.  Medicine team was consulted for transient esophageal and epigastric pain which arose from patient eating and hamburger which got stuck in his throat, he usually eats soft diet.   Subjective:    [name] today has, No headache, No chest pain, No abdominal pain - No Nausea, No new weakness tingling or numbness, No Cough - SOB. Back pain much improved.   Assessment  & Plan :     1. Transient esophageal and epigastric pain which arose from patient eating and hamburger which got stuck in his throat, he usually eats soft diet. Has history of his aphasia strictures along with esophageal dilations and surgical intervention in the remote past. This was transient due to eating not his usual soft diet. Symptoms have completely resolved, he is symptom free without any dysphagia or odynophagia on soft diet which will be continued.  2. HTN - continue low-dose better blocker( dose  cut)  and low dose home Lasix. Monitor blood pressures closely.  3. Dyslipidemia. On statin.  4. CAD. Chest pain free no acute issues continue aspirin, statin and beta blocker for secondary prevention.  5. GERD. On PPI.  6. CLL - with chronic leukocytosis, no acute issues follow with PCP and primary oncologist postdischarge. Certolizumab Pegol 1/month.  7. HIV -  on GENVOYA follows with Dr [name].  8. Hx of recurrent DVT - was on Savaysa, placed on SCDs for now we'll request primary team to consider resuming chemical prophylaxis once surgically stable. I have also called the RN on for this patient on 07/05/2016 to inform the primary team of the same.     Diet : DIET SOFT Room service appropriate? Yes; Fluid consistency: Thin    DVT Prophylaxis  :  Per primary team, ordered SCDs  Lab Results  Component Value Date   PLT 150 07/04/2016    Inpatient Medications  Scheduled Meds: . aspirin EC  81 mg Oral Daily  . atorvastatin  10 mg Oral QHS  .  ceFAZolin (ANCEF) IV  2 g Intravenous Q8H  . Darunavir Ethanolate  800 mg Oral Q breakfast  . docusate sodium  100 mg Oral BID  . elvitegravir-cobicistat-emtricitabine-tenofovir  1 tablet Oral Q breakfast  . escitalopram  10 mg Oral Daily  . furosemide  20 mg Oral Daily  . loratadine  10 mg Oral Daily  . metoCLOPramide (REGLAN) injection  5 mg Intravenous Q6H  . metoprolol succinate  25 mg Oral QHS  . pantoprazole  40 mg Oral BID AC  . predniSONE  5 mg Oral Q breakfast  . pregabalin  200 mg Oral BID  . senna  1 tablet Oral BID  . sodium chloride flush  3 mL Intravenous Q12H  . sucralfate  1 g Oral TID WC & HS  . sulfamethoxazole-trimethoprim  1 tablet Oral Q12H   Continuous Infusions: . sodium chloride Stopped (07/05/16 1359)  . lactated ringers 10 mL/hr at 07/02/16 0940   PRN Meds:.acetaminophen **OR** acetaminophen, acetaminophen **OR** acetaminophen, alum & mag hydroxide-simeth, cyclobenzaprine, HYDROmorphone (DILAUDID)  injection, HYDROmorphone, menthol-cetylpyridinium **OR** phenol, methocarbamol, mometasone, ondansetron (ZOFRAN) IV, ondansetron, oxyCODONE, polyethylene glycol, sodium chloride flush  Antibiotics  :    Anti-infectives    Start     Dose/Rate Route Frequency Ordered Stop   07/03/16 0800  Darunavir Ethanolate (PREZISTA) tablet 800 mg     800 mg Oral Daily with breakfast 07/02/16 1755     07/03/16 0800  elvitegravir-cobicistat-emtricitabine-tenofovir (GENVOYA) 150-150-200-10 MG tablet 1 tablet     1 tablet Oral Daily with breakfast 07/02/16 1755     07/02/16 2200  sulfamethoxazole-trimethoprim (BACTRIM DS,SEPTRA DS) 800-160 MG per tablet 1 tablet     1 tablet Oral Every 12 hours 07/02/16 1755     07/02/16 1900  ceFAZolin (ANCEF) IVPB 2g/100 mL premix     2 g 200 mL/hr over 30 Minutes Intravenous Every 8 hours 07/02/16 1755 07/07/16 1859   07/02/16 1442  vancomycin (VANCOCIN) powder  Status:  Discontinued       As needed 07/02/16 1443 07/02/16 1628   07/02/16 1325  bacitracin 50,000 Units in sodium chloride irrigation 0.9 % 500 mL irrigation  Status:  Discontinued       As needed 07/02/16 1325 07/02/16 1628         Objective:   Vitals:   07/05/16 2111 07/06/16 0122 07/06/16 0519 07/06/16 1002  BP: (!) 98/58 (!) 98/52 (!) 109/57 (!) 92/57  Pulse: (!) 54 72 81 71  Resp: 18 18 18 16   Temp: 98 F (36.7 C) 97.8 F (36.6 C) 98.3 F (36.8 C) 98.6 F (37 C)  TempSrc: Oral Oral Oral Oral  SpO2: 96% 95% 98% 96%  Weight:      Height:        Wt Readings from Last 3 Encounters:  07/02/16 83.9 kg (185 lb)  06/25/16 83.9 kg (185 lb)  06/02/16 86.1 kg (189 lb 12 oz)     Intake/Output Summary (Last 24 hours) at 07/06/16 1024 Last data filed at 07/06/16 0838  Gross per 24 hour  Intake              840 ml  Output              825 ml  Net               15 ml     Physical Exam  Awake Alert, Oriented X 3, No new F.N deficits, Normal affect [place].AT,PERRAL Supple Neck,No JVD, No  cervical lymphadenopathy appriciated.  Symmetrical Chest wall movement, Good air movement bilaterally, CTAB RRR,No Gallops,Rubs or new Murmurs, No Parasternal Heave +ve B.Sounds, Abd Soft, No tenderness, No organomegaly appriciated, No rebound - guarding or rigidity. No Cyanosis, Clubbing or edema, No new Rash or bruise    Data Review:    CBC  Recent Labs Lab 07/04/16 0749  WBC 23.1*  HGB 9.6*  HCT 30.5*  PLT 150  MCV 88.7  MCH 27.9  MCHC 31.5  RDW 17.3*  LYMPHSABS 10.6*  MONOABS 1.4*  EOSABS 0.0  BASOSABS 0.0    Chemistries   Recent Labs Lab 07/04/16 0749  NA 136  K 3.6  CL 101  CO2 28  GLUCOSE 121*  BUN 12  CREATININE 0.88  CALCIUM 8.1*   ------------------------------------------------------------------------------------------------------------------ No results for input(s): CHOL, HDL, LDLCALC, TRIG, CHOLHDL, LDLDIRECT in the last 72 hours.  Lab Results  Component Value Date   HGBA1C 5.2 12/18/2015   ------------------------------------------------------------------------------------------------------------------ No results for input(s): TSH, T4TOTAL, T3FREE, THYROIDAB in the last 72 hours.  Invalid input(s): FREET3 ------------------------------------------------------------------------------------------------------------------ No results for input(s): VITAMINB12, FOLATE, FERRITIN, TIBC, IRON, RETICCTPCT in the last 72 hours.  Coagulation profile No results for input(s): INR, PROTIME in the last 168 hours.  No results for input(s): DDIMER in the last 72 hours.  Cardiac Enzymes No results for input(s): CKMB, TROPONINI, MYOGLOBIN in the last 168 hours.  Invalid input(s): CK ------------------------------------------------------------------------------------------------------------------    Component Value Date/Time   BNP 164.7 (H) 04/20/2015 1153   BNP 110.0 (H) 07/24/2014 0943    Micro Results Recent Results (from the past 240 hour(s))    Fungus Culture With Stain     Status: None (Preliminary result)   Collection Time: 07/02/16  1:32 PM  Result Value Ref Range Status   Fungus Stain Final report  Final    Comment: (NOTE) Performed At: [facility] [place], [place] [id number] Lindon Romp MD [id number]    Fungus (Mycology) Culture PENDING  Incomplete   Fungal Source THORACIC SPINE   Final  Aerobic/Anaerobic Culture (surgical/deep wound)     Status: None (Preliminary result)   Collection Time: 07/02/16  1:32 PM  Result Value Ref Range Status   Specimen Description THORACIC  Final   Special Requests SPINE  Final   Gram Stain   Final    FEW WBC PRESENT,BOTH PMN AND MONONUCLEAR NO ORGANISMS SEEN    Culture   Final    NO GROWTH 2 DAYS NO ANAEROBES ISOLATED; CULTURE IN PROGRESS FOR 5 DAYS   Report Status PENDING  Incomplete  Fungus Culture Result     Status: None   Collection Time: 07/02/16  1:32 PM  Result Value Ref Range Status   Result 1 Comment  Final    Comment: (NOTE) KOH/Calcofluor preparation:  no fungus  observed. Performed At: [facility] [place], [place] [id number] Lindon Romp MD [id number]     Radiology Reports Dg Thoracolumabar Spine  Result Date: 07/02/2016 CLINICAL DATA:  Thoracolumbar spine fusion. EXAM: DG C-ARM 61-120 MIN; THORACOLUMBAR SPINE - 2 VIEW COMPARISON:  05/20/2016. FINDINGS: Pedicle screws noted in lower thoracic vertebral bodies. These appear to be intact. lower most screw is noted in a transverse orientation on AP view. Clinical correlation suggested. Two images obtained. 0 minutes 46 seconds fluoroscopy time . IMPRESSION: Pedicle screws noted in  Lower thoracic vertebral bodies as above. Electronically Signed   By: Marcello Moores  Register   On: 07/02/2016 15:18   Dg C-arm 1-60 Min  Result Date: 07/02/2016 CLINICAL DATA:  Thoracolumbar spine fusion. EXAM: DG C-ARM 61-120 MIN; THORACOLUMBAR SPINE - 2 VIEW COMPARISON:  05/20/2016.  FINDINGS: Pedicle screws noted in lower thoracic vertebral bodies. These appear to be intact. lower most screw is noted in a transverse orientation on AP view. Clinical correlation suggested. Two images obtained. 0 minutes 46 seconds fluoroscopy time . IMPRESSION: Pedicle screws noted in  Lower thoracic vertebral bodies as above. Electronically Signed   By: Marcello Moores  Register   On: 07/02/2016 15:18    Time Spent in minutes  30   Lala Lund K M.D on 07/06/2016 at 10:24 AM  Between 7am to 7pm - Pager - [phone]  After 7pm go to [email or web address] - password [facility]  [facility] -  Office  [phone]

## 2016-07-07 ENCOUNTER — Other Ambulatory Visit: Payer: Self-pay | Admitting: Infectious Diseases

## 2016-07-07 DIAGNOSIS — M4805 Spinal stenosis, thoracolumbar region: Secondary | ICD-10-CM | POA: Diagnosis not present

## 2016-07-07 DIAGNOSIS — M6281 Muscle weakness (generalized): Secondary | ICD-10-CM | POA: Diagnosis not present

## 2016-07-07 DIAGNOSIS — R278 Other lack of coordination: Secondary | ICD-10-CM | POA: Diagnosis not present

## 2016-07-07 DIAGNOSIS — Z981 Arthrodesis status: Secondary | ICD-10-CM | POA: Diagnosis not present

## 2016-07-07 DIAGNOSIS — K5901 Slow transit constipation: Secondary | ICD-10-CM | POA: Diagnosis not present

## 2016-07-07 DIAGNOSIS — F329 Major depressive disorder, single episode, unspecified: Secondary | ICD-10-CM | POA: Diagnosis not present

## 2016-07-07 DIAGNOSIS — B20 Human immunodeficiency virus [HIV] disease: Secondary | ICD-10-CM

## 2016-07-07 DIAGNOSIS — M25511 Pain in right shoulder: Secondary | ICD-10-CM | POA: Diagnosis not present

## 2016-07-07 DIAGNOSIS — R531 Weakness: Secondary | ICD-10-CM | POA: Diagnosis not present

## 2016-07-07 DIAGNOSIS — D62 Acute posthemorrhagic anemia: Secondary | ICD-10-CM | POA: Diagnosis not present

## 2016-07-07 DIAGNOSIS — K219 Gastro-esophageal reflux disease without esophagitis: Secondary | ICD-10-CM | POA: Diagnosis not present

## 2016-07-07 DIAGNOSIS — I82403 Acute embolism and thrombosis of unspecified deep veins of lower extremity, bilateral: Secondary | ICD-10-CM | POA: Diagnosis not present

## 2016-07-07 DIAGNOSIS — R2681 Unsteadiness on feet: Secondary | ICD-10-CM | POA: Diagnosis not present

## 2016-07-07 DIAGNOSIS — E785 Hyperlipidemia, unspecified: Secondary | ICD-10-CM | POA: Diagnosis not present

## 2016-07-07 DIAGNOSIS — M545 Low back pain: Secondary | ICD-10-CM | POA: Diagnosis not present

## 2016-07-07 DIAGNOSIS — G629 Polyneuropathy, unspecified: Secondary | ICD-10-CM | POA: Diagnosis not present

## 2016-07-07 DIAGNOSIS — R2689 Other abnormalities of gait and mobility: Secondary | ICD-10-CM | POA: Diagnosis not present

## 2016-07-07 DIAGNOSIS — I10 Essential (primary) hypertension: Secondary | ICD-10-CM | POA: Diagnosis not present

## 2016-07-07 DIAGNOSIS — M549 Dorsalgia, unspecified: Secondary | ICD-10-CM | POA: Diagnosis not present

## 2016-07-07 DIAGNOSIS — Z79899 Other long term (current) drug therapy: Secondary | ICD-10-CM | POA: Diagnosis not present

## 2016-07-07 DIAGNOSIS — B028 Zoster with other complications: Secondary | ICD-10-CM | POA: Diagnosis not present

## 2016-07-07 LAB — AEROBIC/ANAEROBIC CULTURE (SURGICAL/DEEP WOUND): Culture: NO GROWTH

## 2016-07-07 MED ORDER — OXYCODONE HCL 5 MG PO TABS: 5.0000 mg | ORAL_TABLET | ORAL | 0 refills | Status: DC | PRN

## 2016-07-07 MED ORDER — BISACODYL 10 MG RE SUPP
10.0000 mg | Freq: Every day | RECTAL | Status: DC
  Administered 2016-07-07: 10 mg via RECTAL
  Filled 2016-07-07: qty 1

## 2016-07-07 NOTE — Progress Notes (Signed)
Pt transported off unit via EMS on route to SNF.

## 2016-07-07 NOTE — Care Management Important Message (Signed)
Important Message  Patient Details  Name: [name] MRN: [id number] Date of Birth: May 31, 1950   Medicare Important Message Given:  Yes    Shacarra Choe 07/07/2016, 3:12 PM

## 2016-07-07 NOTE — Discharge Summary (Signed)
Physician Discharge Summary  Patient ID: [name] MRN: [id number] DOB/AGE: March 04, 1951 66 y.o.  Admit date: 07/02/2016 Discharge date: 07/07/2016  Admission Diagnoses:T12-L1 degenerative disc disease lumbar spinal stenosis and cord compression  Discharge Diagnoses: Same samegood Principal Problem:   Spinal stenosis of thoracolumbar region Active Problems:   CAD (coronary artery disease) of artery bypass graft   Hyperlipidemia with target LDL less than 100   Hypertension   Erosive esophagitis   GERD (gastroesophageal reflux disease)   Hiatal hernia   Discharged Condition: good  Hospital Course: Patient is admitted the hospital underwent decompressive laminectomy at T12-L1 and spinal stabilization from T10-L2. Postoperatively patient did very well was kept flat for the first couple days because of and interoperative CSF leak. But then patient was slowly progress immobilize and did very well with significant improvement preoperative back and leg pain. Patient stable for discharge to nursing facility. He'll be discharged with oxycodone for pain he can resume all of his previous medications as normally included his blood thinners.  Consults: Significant Diagnostic Studies: Treatments: Decompression stabilization procedure at T11-12 and T10 L2. Discharge Exam: Blood pressure 126/62, pulse 84, temperature 98.2 F (36.8 C), temperature source Oral, resp. rate 17, height 5\' 7"  (1.702 m), weight 83.9 kg (185 lb), SpO2 91 %. Strength 5 out of 5 wound clean dry and intact  Disposition: Skilled nursing facility   Allergies as of 07/07/2016      Reactions   Other Anaphylaxis, Hives   Pecan   Peanut-containing Drug Products Anaphylaxis, Hives, Swelling   Swelling of throat   Morphine Other (See Comments)   REACTION: severe headache   Oxycodone-acetaminophen Other (See Comments)   REACTION: headache   Penicillins Rash, Other (See Comments)   FLUSHED, RED Has patient had a PCN  reaction causing immediate rash, facial/tongue/throat swelling, SOB or lightheadedness with hypotension: #  #  #  YES  #  #  #  Has patient had a PCN reaction causing severe rash involving mucus membranes or skin necrosis: No Has patient had a PCN reaction that required hospitalization No Has patient had a PCN reaction occurring within the last 10 years: No If all of the above answers are "NO", then may proceed with Cephalosporin use   Promethazine Hcl Other (See Comments)   REACTION: makes him feel drunk      Medication List    TAKE these medications   acetaminophen 325 MG tablet Commonly known as:  TYLENOL Take 2 tablets (650 mg total) by mouth every 6 (six) hours as needed for mild pain (or Fever >/= 101).   aspirin EC 81 MG tablet Take 81 mg by mouth daily.   atorvastatin 10 MG tablet Commonly known as:  LIPITOR TAKE 1 TABLET (10 MG TOTAL) BY MOUTH AT BEDTIME.   ciclopirox 0.77 % Susp Commonly known as:  LOPROX Apply 1 Act topically 2 (two) times daily. What changed:  how much to take  when to take this  reasons to take this   CIMZIA [place] Inject into the skin. Pt unsure of dose. Every four weeks.   edoxaban 30 MG Tabs tablet Commonly known as:  SAVAYSA Take 1 tablet (30 mg total) by mouth daily.   elvitegravir-cobicistat-emtricitabine-tenofovir 150-150-200-10 MG Tabs tablet Commonly known as:  GENVOYA Take 1 tablet by mouth daily with breakfast.   escitalopram 10 MG tablet Commonly known as:  LEXAPRO Take 1 tablet (10 mg total) by mouth daily.   furosemide 20 MG tablet Commonly known as:  LASIX  TAKE 1 TABLET (20 MG TOTAL) BY MOUTH 2 (TWO) TIMES DAILY.   HYDROmorphone 2 MG tablet Commonly known as:  DILAUDID Take 1 tablet (2 mg total) by mouth every 6 (six) hours as needed for severe pain.   loratadine 10 MG tablet Commonly known as:  CLARITIN TAKE 1 TABLET (10 MG TOTAL) BY MOUTH DAILY.   methocarbamol 500 MG tablet Commonly known as:  ROBAXIN Take 1  tablet (500 mg total) by mouth every 8 (eight) hours as needed for muscle spasms.   metoprolol succinate 25 MG 24 hr tablet Commonly known as:  TOPROL-XL Take 1 tablet (25 mg total) by mouth daily. Please schedule appointment for refills. What changed:  when to take this  additional instructions   mometasone 50 MCG/ACT nasal spray Commonly known as:  NASONEX Place 2 sprays into the nose daily as needed (for nasal congestion.).   ondansetron 4 MG disintegrating tablet Commonly known as:  ZOFRAN ODT Take 1 tablet (4 mg total) by mouth every 8 (eight) hours as needed for nausea or vomiting.   oxyCODONE 5 MG immediate release tablet Commonly known as:  Oxy IR/ROXICODONE Take 1-2 tablets (5-10 mg total) by mouth every 3 (three) hours as needed for breakthrough pain.   pantoprazole 40 MG tablet Commonly known as:  PROTONIX Take 1 tablet (40 mg total) by mouth 2 (two) times daily before a meal. What changed:  when to take this   polyethylene glycol packet Commonly known as:  MIRALAX / GLYCOLAX Take 17 g by mouth daily as needed for mild constipation.   predniSONE 5 MG tablet Commonly known as:  DELTASONE Take 5 mg by mouth daily with breakfast.   pregabalin 200 MG capsule Commonly known as:  LYRICA Take 1 capsule (200 mg total) by mouth 2 (two) times daily.   PREZISTA 800 MG tablet Generic drug:  Darunavir Ethanolate TAKE 1 TABLET (800 MG TOTAL) BY MOUTH DAILY.   sulfamethoxazole-trimethoprim 800-160 MG tablet Commonly known as:  BACTRIM DS,SEPTRA DS TAKE 1 TABLET BY MOUTH EVERY 12 (TWELVE) HOURS.   valACYclovir 500 MG tablet Commonly known as:  VALTREX TAKE 1 TABLET BY MOUTH EVERY DAY What changed:  See the new instructions.       Contact information for follow-up providers    Alondra Vandeven P, MD Follow up.   Specialty:  Neurosurgery Contact information: [address] [place] [address] [phone]            Contact information for  after-discharge care    Destination    [facility] Follow up.   Specialty:  [place] information: [facility] [phone]                  Signed: Elaina Hoops 07/07/2016, 12:22 PM

## 2016-07-07 NOTE — Care Management Note (Signed)
Case Management Note  Patient Details  Name: [name] MRN: [id number] Date of Birth: 02/03/1951  Subjective/Objective:                    Action/Plan: Pt discharging to [facility] today. No further needs per CM.  Expected Discharge Date:  07/07/16               Expected Discharge Plan:  Home/Self Care  In-House Referral:  Clinical Social Work  Discharge planning Services  CM Consult  Post Acute Care Choice:    Choice offered to:     DME Arranged:    DME Agency:     HH Arranged:    HH Agency:     Status of Service:  Completed, signed off  If discussed at [employer], dates discussed:    Additional Comments:  Pollie Friar, RN 07/07/2016, 1:29 PM

## 2016-07-07 NOTE — Progress Notes (Signed)
Consult PROGRESS NOTE                                                                                                           Hospitalist team will sign off please call with questions                                            Patient Demographics:    [name], is a 66 y.o. male, DOB - [birth year], [id number]  Admit date - 07/02/2016   Admitting Physician Kary Kos, MD  Outpatient Primary MD for the patient is [name], MD  LOS - 5  No chief complaint on file.      Brief Narrative  [name] is an 66 y.o. male HIV hepatitis, chronic renal disease, CLL, PVD with right ICA stenosis, CAD, status post CABG 3, DVT of the left lower extremity diagnosed in 2009, esophagitis with GERD status post multiple esophageal dilatations, hiatal hernia with remote surgical treatment, hypertension, rheumatoid arthritis, secondary syphilis, stroke who underwent complex T12-L2 spinal surgery by Dr. Saintclair Halsted on 07/02/2016.  Medicine team was consulted for transient esophageal and epigastric pain which arose from patient eating and hamburger which got stuck in his throat, he usually eats soft diet.   Subjective:    [name] today has, No headache, No chest pain, No abdominal pain - No Nausea, No new weakness tingling or numbness, No Cough - SOB. Back pain much improved. Eager to be discharged.   Assessment  & Plan :     1. Transient esophageal and epigastric pain which arose from patient eating and hamburger which got stuck in his throat, he usually eats soft diet. Has history of his aphasia strictures along with esophageal dilations and surgical intervention in the remote past. This was transient due to eating not his usual soft diet. Symptoms have completely resolved, he is symptom free without any dysphagia or odynophagia on soft diet which will be continued.  2. HTN - continue  low-dose better blocker( dose cut)  and low dose home Lasix. Monitor blood pressures closely.  3. Dyslipidemia. On statin.  4. CAD. Chest pain free no acute issues continue aspirin, statin and beta blocker for secondary prevention.  5. GERD. On PPI.  6. CLL - with chronic leukocytosis, no acute issues follow with PCP and primary oncologist postdischarge. Certolizumab Pegol 1/month.  7. HIV - on GENVOYA follows with Dr [name].  8. Hx of recurrent DVT - was on Savaysa, placed on SCDs for now we'll request primary team to consider resuming chemical prophylaxis once surgically stable. I have also called the RN on for  this patient on 07/05/2016 to inform the primary team of the same.     Diet : DIET SOFT Room service appropriate? Yes; Fluid consistency: Thin    DVT Prophylaxis  :  Per primary team, ordered SCDs  Lab Results  Component Value Date   PLT 150 07/04/2016    Inpatient Medications  Scheduled Meds: . aspirin EC  81 mg Oral Daily  . atorvastatin  10 mg Oral QHS  . bisacodyl  10 mg Rectal Daily  .  ceFAZolin (ANCEF) IV  2 g Intravenous Q8H  . Darunavir Ethanolate  800 mg Oral Q breakfast  . docusate sodium  100 mg Oral BID  . elvitegravir-cobicistat-emtricitabine-tenofovir  1 tablet Oral Q breakfast  . escitalopram  10 mg Oral Daily  . furosemide  20 mg Oral Daily  . loratadine  10 mg Oral Daily  . metoprolol succinate  12.5 mg Oral QHS  . pantoprazole  40 mg Oral BID AC  . predniSONE  5 mg Oral Q breakfast  . pregabalin  200 mg Oral BID  . senna  1 tablet Oral BID  . sodium chloride flush  3 mL Intravenous Q12H  . sucralfate  1 g Oral TID WC & HS  . sulfamethoxazole-trimethoprim  1 tablet Oral Q12H   Continuous Infusions: . sodium chloride Stopped (07/05/16 1359)  . lactated ringers 10 mL/hr at 07/02/16 0940   PRN Meds:.acetaminophen **OR** acetaminophen, acetaminophen **OR** acetaminophen, alum & mag hydroxide-simeth, cyclobenzaprine, HYDROmorphone (DILAUDID)  injection, HYDROmorphone, menthol-cetylpyridinium **OR** phenol, methocarbamol, mometasone, ondansetron (ZOFRAN) IV, ondansetron, oxyCODONE, polyethylene glycol, sodium chloride flush  Antibiotics  :    Anti-infectives    Start     Dose/Rate Route Frequency Ordered Stop   07/03/16 0800  Darunavir Ethanolate (PREZISTA) tablet 800 mg     800 mg Oral Daily with breakfast 07/02/16 1755     07/03/16 0800  elvitegravir-cobicistat-emtricitabine-tenofovir (GENVOYA) 150-150-200-10 MG tablet 1 tablet     1 tablet Oral Daily with breakfast 07/02/16 1755     07/02/16 2200  sulfamethoxazole-trimethoprim (BACTRIM DS,SEPTRA DS) 800-160 MG per tablet 1 tablet     1 tablet Oral Every 12 hours 07/02/16 1755     07/02/16 1900  ceFAZolin (ANCEF) IVPB 2g/100 mL premix     2 g 200 mL/hr over 30 Minutes Intravenous Every 8 hours 07/02/16 1755 07/07/16 1859   07/02/16 1442  vancomycin (VANCOCIN) powder  Status:  Discontinued       As needed 07/02/16 1443 07/02/16 1628   07/02/16 1325  bacitracin 50,000 Units in sodium chloride irrigation 0.9 % 500 mL irrigation  Status:  Discontinued       As needed 07/02/16 1325 07/02/16 1628         Objective:   Vitals:   07/06/16 1827 07/06/16 2155 07/07/16 0140 07/07/16 0538  BP: (!) 93/49 (!) 109/51 99/61 (!) 116/54  Pulse: 85 75 86 80  Resp: 18 16 16 16   Temp: 97.9 F (36.6 C) 97.8 F (36.6 C) 97.9 F (36.6 C) 98.1 F (36.7 C)  TempSrc: Axillary Oral Oral Oral  SpO2: 98% 94% 95% 96%  Weight:      Height:        Wt Readings from Last 3 Encounters:  07/02/16 83.9 kg (185 lb)  06/25/16 83.9 kg (185 lb)  06/02/16 86.1 kg (189 lb 12 oz)     Intake/Output Summary (Last 24 hours) at 07/07/16 0929 Last data filed at 07/07/16 0140  Gross per 24 hour  Intake  123 ml  Output             1600 ml  Net            -1477 ml     Physical Exam  Awake Alert, Oriented X 3, No new F.N deficits, Normal affect [place].AT,PERRAL Supple Neck,No JVD, No  cervical lymphadenopathy appriciated.  Symmetrical Chest wall movement, Good air movement bilaterally, CTAB RRR,No Gallops,Rubs or new Murmurs, No Parasternal Heave +ve B.Sounds, Abd Soft, No tenderness, No organomegaly appriciated, No rebound - guarding or rigidity. No Cyanosis, Clubbing or edema, No new Rash or bruise    Data Review:    CBC  Recent Labs Lab 07/04/16 0749  WBC 23.1*  HGB 9.6*  HCT 30.5*  PLT 150  MCV 88.7  MCH 27.9  MCHC 31.5  RDW 17.3*  LYMPHSABS 10.6*  MONOABS 1.4*  EOSABS 0.0  BASOSABS 0.0    Chemistries   Recent Labs Lab 07/04/16 0749  NA 136  K 3.6  CL 101  CO2 28  GLUCOSE 121*  BUN 12  CREATININE 0.88  CALCIUM 8.1*   ------------------------------------------------------------------------------------------------------------------ No results for input(s): CHOL, HDL, LDLCALC, TRIG, CHOLHDL, LDLDIRECT in the last 72 hours.  Lab Results  Component Value Date   HGBA1C 5.2 12/18/2015   ------------------------------------------------------------------------------------------------------------------ No results for input(s): TSH, T4TOTAL, T3FREE, THYROIDAB in the last 72 hours.  Invalid input(s): FREET3 ------------------------------------------------------------------------------------------------------------------ No results for input(s): VITAMINB12, FOLATE, FERRITIN, TIBC, IRON, RETICCTPCT in the last 72 hours.  Coagulation profile No results for input(s): INR, PROTIME in the last 168 hours.  No results for input(s): DDIMER in the last 72 hours.  Cardiac Enzymes No results for input(s): CKMB, TROPONINI, MYOGLOBIN in the last 168 hours.  Invalid input(s): CK ------------------------------------------------------------------------------------------------------------------    Component Value Date/Time   BNP 164.7 (H) 04/20/2015 1153   BNP 110.0 (H) 07/24/2014 0943    Micro Results Recent Results (from the past 240 hour(s))    Fungus Culture With Stain     Status: None (Preliminary result)   Collection Time: 07/02/16  1:32 PM  Result Value Ref Range Status   Fungus Stain Final report  Final    Comment: (NOTE) Performed At: [facility] [place], [place] [id number] Lindon Romp MD [id number]    Fungus (Mycology) Culture PENDING  Incomplete   Fungal Source THORACIC SPINE   Final  Aerobic/Anaerobic Culture (surgical/deep wound)     Status: None (Preliminary result)   Collection Time: 07/02/16  1:32 PM  Result Value Ref Range Status   Specimen Description THORACIC  Final   Special Requests SPINE  Final   Gram Stain   Final    FEW WBC PRESENT,BOTH PMN AND MONONUCLEAR NO ORGANISMS SEEN    Culture   Final    NO GROWTH 2 DAYS NO ANAEROBES ISOLATED; CULTURE IN PROGRESS FOR 5 DAYS   Report Status PENDING  Incomplete  Fungus Culture Result     Status: None   Collection Time: 07/02/16  1:32 PM  Result Value Ref Range Status   Result 1 Comment  Final    Comment: (NOTE) KOH/Calcofluor preparation:  no fungus observed. Performed At: [facility] [place], [place] [id number] Lindon Romp MD [id number]     Radiology Reports Dg Thoracolumabar Spine  Result Date: 07/02/2016 CLINICAL DATA:  Thoracolumbar spine fusion. EXAM: DG C-ARM 61-120 MIN; THORACOLUMBAR SPINE - 2 VIEW COMPARISON:  05/20/2016. FINDINGS: Pedicle screws noted in lower thoracic vertebral bodies. These appear to  be intact. lower most screw is noted in a transverse orientation on AP view. Clinical correlation suggested. Two images obtained. 0 minutes 46 seconds fluoroscopy time . IMPRESSION: Pedicle screws noted in  Lower thoracic vertebral bodies as above. Electronically Signed   By: Marcello Moores  Register   On: 07/02/2016 15:18   Dg C-arm 1-60 Min  Result Date: 07/02/2016 CLINICAL DATA:  Thoracolumbar spine fusion. EXAM: DG C-ARM 61-120 MIN; THORACOLUMBAR SPINE - 2 VIEW COMPARISON:  05/20/2016.  FINDINGS: Pedicle screws noted in lower thoracic vertebral bodies. These appear to be intact. lower most screw is noted in a transverse orientation on AP view. Clinical correlation suggested. Two images obtained. 0 minutes 46 seconds fluoroscopy time . IMPRESSION: Pedicle screws noted in  Lower thoracic vertebral bodies as above. Electronically Signed   By: Marcello Moores  Register   On: 07/02/2016 15:18    Time Spent in minutes  30   Lala Lund K M.D on 07/07/2016 at 9:29 AM  Between 7am to 7pm - Pager - [phone]  After 7pm go to [email or web address] - password [facility]  [facility] -  Office  [phone]

## 2016-07-07 NOTE — Progress Notes (Signed)
Physical Therapy Treatment Patient Details Name: [name] MRN: [id number] DOB: Dec 23, 1950 Today's Date: 07/07/2016    History of Present Illness Pt s/p T10-L2 fusion. PMH - multiple back surgeries, HIV, CLL, Bil TKR, CVA, CABG, HTN, rt rotator cuff tear    PT Comments    Pt progressing towards physical therapy goals. Was able to improve ambulation distance this session however pt very fatigued upon return to room and appeared somewhat shaky.  Symptoms improved with seated rest break. As tolerance for functional activity remains low, feel SNF at d/c continues to be appropriate for post-acute rehab. Will continue to follow.  Follow Up Recommendations  SNF     Equipment Recommendations  None recommended by PT    Recommendations for Other Services OT consult     Precautions / Restrictions Precautions Precautions: Back;Fall Precaution Comments: Reviewed precautions during functional mobility Required Braces or Orthoses: Spinal Brace Spinal Brace: Lumbar corset;Applied in sitting position (more for comfort per nursing) Restrictions Weight Bearing Restrictions: No    Mobility  Bed Mobility               General bed mobility comments: Pt received exiting bathroom with nursing student present.  Transfers Overall transfer level: Needs assistance Equipment used: 4-wheeled walker Transfers: Sit to/from Stand Sit to Stand: Min guard         General transfer comment: Hands on guarding for balance support as pt powered up to full standing position.  Ambulation/Gait Ambulation/Gait assistance: Min assist Ambulation Distance (Feet): 120 Feet Assistive device: 4-wheeled walker Gait Pattern/deviations: Step-through pattern;Decreased stride length;Trunk flexed;Narrow base of support;Drifts right/left Gait velocity: Decareased Gait velocity interpretation: Below normal speed for age/gender General Gait Details: Occasional assist for balance and support. Verbal cues to  stand more erect. As distance incr pt became more flexed   Stairs            Wheelchair Mobility    Modified Rankin (Stroke Patients Only)       Balance Overall balance assessment: Needs assistance Sitting-balance support: No upper extremity supported;Feet supported Sitting balance-[name]: Fair     Standing balance support: Bilateral upper extremity supported Standing balance-[name]: Poor Standing balance comment: rollator and min A for dynamic standing                    Cognition Arousal/Alertness: Awake/alert Behavior During Therapy: WFL for tasks assessed/performed Overall Cognitive Status: Within Functional Limits for tasks assessed                      Exercises      General Comments        Pertinent Vitals/Pain Pain Assessment: Faces Faces Pain Scale: Hurts a little bit Pain Location: back Pain Descriptors / Indicators: Operative site guarding Pain Intervention(s): Limited activity within patient's tolerance;Monitored during session;Repositioned    Home Living Family/patient expects to be discharged to:: Private residence Living Arrangements: Alone                  Prior Function            PT Goals (current goals can now be found in the care plan section) Acute Rehab PT Goals Patient Stated Goal: go to rehab and then home PT Goal Formulation: With patient Time For Goal Achievement: 07/12/16 Potential to Achieve Goals: Good Progress towards PT goals: Progressing toward goals    Frequency    Min 5X/week      PT Plan Current plan remains  appropriate    Co-evaluation             End of Session Equipment Utilized During Treatment: Gait belt;Back brace Activity Tolerance: Patient tolerated treatment well Patient left: in chair;with call bell/phone within reach;with chair alarm set Nurse Communication: Mobility status PT Visit Diagnosis: Other abnormalities of gait and mobility (R26.89)      Time: [id number] PT Time Calculation (min) (ACUTE ONLY): 12 min  Charges:  $Gait Training: 8-22 mins                    G Codes:       Thelma Comp 2016-07-29, 12:35 PM   Rolinda Roan, PT, DPT Acute Rehabilitation Services Pager: [phone]

## 2016-07-07 NOTE — Clinical Social Work Note (Signed)
Clinical Social Worker facilitated patient discharge including contacting patient family and facility to confirm patient discharge plans.  Clinical information faxed to facility and family agreeable with plan.  CSW arranged ambulance transport via [place] to [place].  RN to call [phone] for report prior to discharge. Patient will go to room 509.  Clinical Social Worker will sign off for now as social work intervention is no longer needed. Please consult [place] again if new need arises.  [address], [place]

## 2016-07-08 ENCOUNTER — Non-Acute Institutional Stay (SKILLED_NURSING_FACILITY): Payer: Medicare Other | Admitting: Adult Health

## 2016-07-08 ENCOUNTER — Encounter: Payer: Self-pay | Admitting: Adult Health

## 2016-07-08 ENCOUNTER — Telehealth: Payer: Self-pay | Admitting: *Deleted

## 2016-07-08 DIAGNOSIS — E785 Hyperlipidemia, unspecified: Secondary | ICD-10-CM | POA: Diagnosis not present

## 2016-07-08 DIAGNOSIS — K219 Gastro-esophageal reflux disease without esophagitis: Secondary | ICD-10-CM | POA: Diagnosis not present

## 2016-07-08 DIAGNOSIS — R2681 Unsteadiness on feet: Secondary | ICD-10-CM | POA: Diagnosis not present

## 2016-07-08 DIAGNOSIS — I82403 Acute embolism and thrombosis of unspecified deep veins of lower extremity, bilateral: Secondary | ICD-10-CM | POA: Diagnosis not present

## 2016-07-08 DIAGNOSIS — F32A Depression, unspecified: Secondary | ICD-10-CM

## 2016-07-08 DIAGNOSIS — D62 Acute posthemorrhagic anemia: Secondary | ICD-10-CM

## 2016-07-08 DIAGNOSIS — I10 Essential (primary) hypertension: Secondary | ICD-10-CM | POA: Diagnosis not present

## 2016-07-08 DIAGNOSIS — B028 Zoster with other complications: Secondary | ICD-10-CM | POA: Diagnosis not present

## 2016-07-08 DIAGNOSIS — K5901 Slow transit constipation: Secondary | ICD-10-CM | POA: Diagnosis not present

## 2016-07-08 DIAGNOSIS — F329 Major depressive disorder, single episode, unspecified: Secondary | ICD-10-CM

## 2016-07-08 DIAGNOSIS — M4805 Spinal stenosis, thoracolumbar region: Secondary | ICD-10-CM

## 2016-07-08 DIAGNOSIS — B20 Human immunodeficiency virus [HIV] disease: Secondary | ICD-10-CM | POA: Diagnosis not present

## 2016-07-08 DIAGNOSIS — G629 Polyneuropathy, unspecified: Secondary | ICD-10-CM | POA: Diagnosis not present

## 2016-07-08 LAB — PATHOLOGIST SMEAR REVIEW

## 2016-07-08 NOTE — Progress Notes (Signed)
DATE:  07/08/2016   MRN:  [id number]  BIRTHDAY: 1950/06/01  Facility:  [facility]ursing Home Location:  [place] Room Number: 509-P  LEVEL OF CARE:  SNF (31)  Contact Information    Name Relation Home Work Mobile   Fritz,Tricia Daughter [phone]         Code Status History    Date Active Date Inactive Code Status Order ID Comments User Context   07/02/2016  5:55 PM 07/07/2016  7:17 PM Full Code [id number]  Kary Kos, MD Inpatient   05/13/2016 11:21 PM 05/24/2016  6:36 PM Full Code [id number]  Vianne Bulls, MD Inpatient   07/02/2015  6:35 PM 07/13/2015  5:55 PM Full Code [id number]  Samuella Cota, MD Inpatient   06/18/2015  4:59 PM 06/20/2015  6:26 PM Full Code [id number]  Coralie Common, MD Inpatient   11/02/2013  4:16 PM 11/04/2013  4:56 PM Full Code [id number]  Radene Gunning, NP Inpatient    Advance Directive Documentation   Flowsheet Row Most Recent Value  Type of Advance Directive  Out of facility DNR (pink MOST or yellow form)  Pre-existing out of facility DNR order (yellow form or pink MOST form)  No data  "MOST" Form in Place?  No data       Chief Complaint  Patient presents with  . Hospitalization Follow-up    HISTORY OF PRESENT ILLNESS:  This is a 54-YO male seen for hospital followup.  He was admitted to [facility] for short-term rehabilitation on 07/07/2016 following an admission at [facility] 07/02/2016-07/07/2016 for spinal stenosis of the thoracolumbar region S/P decompressive laminectomy at T12-L1 and spinal stabilization T10-L2. He has PMH of HIV, hepatitis, chronic renal disease, CLL, PVD with right ICA stenosis, CAD S/P CABG 3, DVT of the left lower extremity diagnosed in 2009,  diabetes mellitus, GERD S/P multiple esophageal dilatation, hiatal hernia with remote surgical treatment, hypertension, RA secondary to syphilis, stroke who underwent complex T12-112 spinal surgery.  He was seen in the room today. He requested  for his oxycodone to be discontinued since he is allergic to it.  PAST MEDICAL HISTORY:  Past Medical History:  Diagnosis Date  . Carotid artery occlusion    40-60% right ICA stenosis (09/2008)  . Chronic back pain    follows with Nsurg  . Chronic back pain   . CLL (chronic lymphoblastic leukemia) dx 2010   Followed at [facility] q42mo, no current therapy   . Coronary artery disease 2010   s/p CABG '10, sees Dr. Percival Spanish  . Depression   . Diverticulosis   . DVT, lower extremity, recurrent ([place]) 2008, 2009   LLE, chronic anticoag since 2009  . Esophagitis   . Fibromyalgia   . Gallstones   . GERD (gastroesophageal reflux disease)   . Gout   . Gynecomastia, male   . H/O hiatal hernia 2008   surgery  . Hemorrhoids   . Hepatitis A yrs ago  . HIV infection ([place]) dx 1993  . Hypertension   . Impotence of organic origin   . Myocardial infarction 2010    x 2  . Neuromuscular disorder (HCC)    neuropathy  . Osteoarthritis, knee    s/p B TKA  . Pneumonia mrach, may, july 2017  . Rheumatoid arthritis(714.0) dx 2010   MTX, follows with rheum  . Seasonal allergies   . Secondary syphilis 07/24/14 dx   s/p 2 wks doxy  . Status post  dilation of esophageal narrowing   . Stroke ([place]) [place]   . TIA (transient ischemic attack) 1997   mild residual L mouth droop  . Tubular adenoma of colon      CURRENT MEDICATIONS: Reviewed  Patient's Medications  New Prescriptions   No medications on file  Previous Medications   ACETAMINOPHEN (TYLENOL) 325 MG TABLET    Take 2 tablets (650 mg total) by mouth every 6 (six) hours as needed for mild pain (or Fever >/= 101).   ASPIRIN EC 81 MG TABLET    Take 81 mg by mouth daily.   ATORVASTATIN (LIPITOR) 10 MG TABLET    TAKE 1 TABLET (10 MG TOTAL) BY MOUTH AT BEDTIME.   CERTOLIZUMAB PEGOL (CIMZIA [place])    Inject into the skin. Pt unsure of dose. Every four weeks.   CICLOPIROX (LOPROX) 0.77 % SUSP    Apply 1 Act topically 2 (two) times daily.   EDOXABAN  (SAVAYSA) 30 MG TABS TABLET    Take 1 tablet (30 mg total) by mouth daily.   ELVITEGRAVIR-COBICISTAT-EMTRICITABINE-TENOFOVIR (GENVOYA) 150-150-200-10 MG TABS TABLET    Take 1 tablet by mouth daily with breakfast.   ESCITALOPRAM (LEXAPRO) 10 MG TABLET    Take 1 tablet (10 mg total) by mouth daily.   FUROSEMIDE (LASIX) 20 MG TABLET    TAKE 1 TABLET (20 MG TOTAL) BY MOUTH 2 (TWO) TIMES DAILY.   HYDROMORPHONE (DILAUDID) 2 MG TABLET    Take 1 tablet (2 mg total) by mouth every 6 (six) hours as needed for severe pain.   LORATADINE (CLARITIN) 10 MG TABLET    TAKE 1 TABLET (10 MG TOTAL) BY MOUTH DAILY.   METHOCARBAMOL (ROBAXIN) 500 MG TABLET    Take 1 tablet (500 mg total) by mouth every 8 (eight) hours as needed for muscle spasms.   METOPROLOL SUCCINATE (TOPROL-XL) 25 MG 24 HR TABLET    Take 1 tablet (25 mg total) by mouth daily. Please schedule appointment for refills.   MOMETASONE (NASONEX) 50 MCG/ACT NASAL SPRAY    Place 2 sprays into the nose daily as needed (for nasal congestion.).   ONDANSETRON (ZOFRAN ODT) 4 MG DISINTEGRATING TABLET    Take 1 tablet (4 mg total) by mouth every 8 (eight) hours as needed for nausea or vomiting.   OXYCODONE (OXY IR/ROXICODONE) 5 MG IMMEDIATE RELEASE TABLET    Take 1-2 tablets (5-10 mg total) by mouth every 3 (three) hours as needed for breakthrough pain.   PANTOPRAZOLE (PROTONIX) 40 MG TABLET    Take 1 tablet (40 mg total) by mouth 2 (two) times daily before a meal.   POLYETHYLENE GLYCOL (MIRALAX / GLYCOLAX) PACKET    Take 17 g by mouth daily as needed for mild constipation.   PREDNISONE (DELTASONE) 5 MG TABLET    Take 5 mg by mouth daily with breakfast.    PREGABALIN (LYRICA) 200 MG CAPSULE    Take 1 capsule (200 mg total) by mouth 2 (two) times daily.   PREZISTA 800 MG TABLET    TAKE 1 TABLET (800 MG TOTAL) BY MOUTH DAILY.   SULFAMETHOXAZOLE-TRIMETHOPRIM (BACTRIM DS,SEPTRA DS) 800-160 MG TABLET    TAKE 1 TABLET BY MOUTH EVERY 12 (TWELVE) HOURS.   VALACYCLOVIR  (VALTREX) 500 MG TABLET    TAKE 1 TABLET BY MOUTH EVERY DAY  Modified Medications   No medications on file  Discontinued Medications   No medications on file     Allergies  Allergen Reactions  . Other  Anaphylaxis and Hives    Pecan  . Peanut-Containing Drug Products Anaphylaxis, Hives and Swelling    Swelling of throat  . Morphine Other (See Comments)    REACTION: severe headache  . Oxycodone-Acetaminophen Other (See Comments)    REACTION: headache  . Penicillins Rash and Other (See Comments)    FLUSHED, RED  Has patient had a PCN reaction causing immediate rash, facial/tongue/throat swelling, SOB or lightheadedness with hypotension: #  #  #  YES  #  #  #  Has patient had a PCN reaction causing severe rash involving mucus membranes or skin necrosis: No Has patient had a PCN reaction that required hospitalization No Has patient had a PCN reaction occurring within the last 10 years: No If all of the above answers are "NO", then may proceed with Cephalosporin use  . Promethazine Hcl Other (See Comments)    REACTION: makes him feel drunk     REVIEW OF SYSTEMS:  GENERAL: no change in appetite, no fatigue, no weight changes, no fever, chills or weakness EYES: Denies change in vision, dry eyes, eye pain, itching or discharge EARS: Denies change in hearing, ringing in ears, or earache NOSE: Denies nasal congestion or epistaxis MOUTH and THROAT: Denies oral discomfort, gingival pain or bleeding, pain from teeth or hoarseness   RESPIRATORY: no cough, SOB, DOE, wheezing, hemoptysis CARDIAC: no chest pain, edema or palpitations GI: no abdominal pain, diarrhea, constipation, heart burn, nausea or vomiting GU: Denies dysuria, frequency, hematuria, incontinence, or discharge PSYCHIATRIC: Denies feeling of depression or anxiety. No report of hallucinations, insomnia, paranoia, or agitation    PHYSICAL EXAMINATION  GENERAL APPEARANCE: Well nourished. In no acute distress. Normal body  habitus SKIN:  Midline spinal surgical incision is, per grid honeycomb dressing HEAD: Normal in size and contour. No evidence of trauma EYES: Lids open and close normally. No blepharitis, entropion or ectropion. PERRL. Conjunctivae are clear and sclerae are white. Lenses are without opacity EARS: Pinnae are normal. Patient hears normal voice tunes of the examiner MOUTH and THROAT: Lips are without lesions. Oral mucosa is moist and without lesions. Tongue is normal in shape, size, and color and without lesions NECK: supple, trachea midline, no neck masses, no thyroid tenderness, no thyromegaly LYMPHATICS: no LAN in the neck, no supraclavicular LAN RESPIRATORY: breathing is even & unlabored, BS CTAB CARDIAC: RRR, no murmur,no extra heart sounds, no edema GI: abdomen soft, normal BS, no masses, no tenderness, no hepatomegaly, no splenomegaly EXTREMITIES:  Able to move X 4 extremities; has back brace PSYCHIATRIC: Alert and oriented X 3. Affect and behavior are appropriate   LABS/RADIOLOGY: Labs reviewed: Basic Metabolic Panel:  Recent Labs  05/23/16 0547 06/25/16 0939 07/04/16 0749  NA 135 140 136  K 4.0 3.8 3.6  CL 98* 104 101  CO2 28 25 28   GLUCOSE 93 115* 121*  BUN 11 13 12   CREATININE 0.92 1.05 0.88  CALCIUM 8.7* 8.7* 8.1*   Liver Function Tests:  Recent Labs  02/18/16 1537 05/13/16 1405 06/25/16 0939  AST 26 36 30  ALT 24 29 25   ALKPHOS 55 51 107  BILITOT 0.7 0.9 0.9  PROT 5.7* 5.3* 5.7*  ALBUMIN 3.9 3.1* 3.8    Recent Labs  05/13/16 1405  LIPASE 12   CBC:  Recent Labs  11/29/15 0857  05/13/16 1405  05/23/16 0547 06/25/16 0939 07/04/16 0749  WBC 14.3*  < > 10.3  < > 12.3* 19.9* 23.1*  NEUTROABS 2.2  --  3.6  --   --   --  11.1*  HGB 13.2  < > 12.2*  < > 11.5* 13.9 9.6*  HCT 40.6  < > 37.4*  < > 35.6* 43.8 30.5*  MCV 91.2  < > 86.2  < > 84.8 89.2 88.7  PLT 130*  < > 117*  < > 264 153 150  < > = values in this interval not displayed. Lipid  Panel:  Recent Labs  08/30/15 1538  HDL 29*   Cardiac Enzymes:  Recent Labs  05/14/16 2101 05/15/16 0248 05/15/16 0831  TROPONINI <0.03 <0.03 <0.03   CBG:  Recent Labs  05/21/16 0809 05/22/16 0645 05/24/16 0627  GLUCAP 89 91 93      Dg Thoracolumabar Spine  Result Date: 07/02/2016 CLINICAL DATA:  Thoracolumbar spine fusion. EXAM: DG C-ARM 61-120 MIN; THORACOLUMBAR SPINE - 2 VIEW COMPARISON:  05/20/2016. FINDINGS: Pedicle screws noted in lower thoracic vertebral bodies. These appear to be intact. lower most screw is noted in a transverse orientation on AP view. Clinical correlation suggested. Two images obtained. 0 minutes 46 seconds fluoroscopy time . IMPRESSION: Pedicle screws noted in  Lower thoracic vertebral bodies as above. Electronically Signed   By: Marcello Moores  Register   On: 07/02/2016 15:18   Dg C-arm 1-60 Min  Result Date: 07/02/2016 CLINICAL DATA:  Thoracolumbar spine fusion. EXAM: DG C-ARM 61-120 MIN; THORACOLUMBAR SPINE - 2 VIEW COMPARISON:  05/20/2016. FINDINGS: Pedicle screws noted in lower thoracic vertebral bodies. These appear to be intact. lower most screw is noted in a transverse orientation on AP view. Clinical correlation suggested. Two images obtained. 0 minutes 46 seconds fluoroscopy time . IMPRESSION: Pedicle screws noted in  Lower thoracic vertebral bodies as above. Electronically Signed   By: Chrisney   On: 07/02/2016 15:18    ASSESSMENT/PLAN:  Unsteady gait - for rehabilitation,  PT and OT for therapeutic strengthening exercises; fall precautions  Spinal stenosis thoracolumbar region S/P decompression stabilization procedure at T11-12 and T10-L2 -  for rehabilitation,  PT and OT for therapeutic strengthening exercises; discontinue oxycodone due to allergy, continue Dilaudid 2 mg 1 tab by mouth every 6 hours when necessary for pain; Robaxin 500 mg 1 tab by mouth every 8 hours when necessary for muscle spasm; follow-up with Dr. Kary Kos,  neurosurgery, in 2 weeks  History of recurrent DVT -  continue Savaysa 30 mg 1 tab PO Q D,   Hyperlipidemia - continue atorvastatin 10 mg 1 tab by mouth daily at bedtime  HIV - continue Gevoya and Prezista, Bactrim DS 1 tab by mouth every 12 hours follows up with Dr. [name]  Depression - continue Lexapro 10 mg 1 tab by mouth daily  Hypertension - continue Lasix 20 mg 1 tab by mouth twice a day and Toprol-XL 25 mg 1 tab by mouth daily; check BMP  GERD - continue Protonix 40 mg 1 tab by mouth twice a day  Constipation - continue MiraLAX 17 g by mouth daily when necessary  Neuropathy - continue Lyrica 200 mg 1 capsule by mouth twice a day  Herpes Zoster - continue Valtrex 500 mg 1 tab by mouth daily  Anemia, acute blood loss - check CBC Lab Results  Component Value Date   HGB 9.6 (L) 07/04/2016     Goals of care:  Short-term rehabilitation   Danielle Mink C. Heavener - NP    [employer] [phone]

## 2016-07-08 NOTE — Telephone Encounter (Signed)
Pt was on the TCM list admitted 07/02/16 for T12-L1 degenerative disc disease lumbar spinal stenosis and cord compression. Pt had a decompressive laminectomy at T12-L1 and spinal stabilization from T10-L2. Pt was D/C on 2/26, and sent to SNF...Johny Chess

## 2016-07-09 DIAGNOSIS — R2681 Unsteadiness on feet: Secondary | ICD-10-CM | POA: Diagnosis not present

## 2016-07-09 DIAGNOSIS — M25511 Pain in right shoulder: Secondary | ICD-10-CM | POA: Diagnosis not present

## 2016-07-09 DIAGNOSIS — M545 Low back pain: Secondary | ICD-10-CM | POA: Diagnosis not present

## 2016-07-09 LAB — CBC AND DIFFERENTIAL
HCT: 29 % — AB (ref 41–53)
Hemoglobin: 9.2 g/dL — AB (ref 13.5–17.5)
Platelets: 173 10*3/uL (ref 150–399)
WBC: 14.6 10^3/mL

## 2016-07-09 LAB — BASIC METABOLIC PANEL
BUN: 13 mg/dL (ref 4–21)
Creatinine: 0.9 mg/dL (ref 0.6–1.3)
Glucose: 92 mg/dL
Potassium: 3.7 mmol/L (ref 3.4–5.3)
Sodium: 139 mmol/L (ref 137–147)

## 2016-07-11 ENCOUNTER — Non-Acute Institutional Stay (SKILLED_NURSING_FACILITY): Payer: Medicare Other | Admitting: Adult Health

## 2016-07-11 ENCOUNTER — Encounter: Payer: Self-pay | Admitting: Adult Health

## 2016-07-11 DIAGNOSIS — E785 Hyperlipidemia, unspecified: Secondary | ICD-10-CM | POA: Diagnosis not present

## 2016-07-11 DIAGNOSIS — D62 Acute posthemorrhagic anemia: Secondary | ICD-10-CM | POA: Diagnosis not present

## 2016-07-11 DIAGNOSIS — B028 Zoster with other complications: Secondary | ICD-10-CM

## 2016-07-11 DIAGNOSIS — M4805 Spinal stenosis, thoracolumbar region: Secondary | ICD-10-CM | POA: Diagnosis not present

## 2016-07-11 DIAGNOSIS — F32A Depression, unspecified: Secondary | ICD-10-CM

## 2016-07-11 DIAGNOSIS — B20 Human immunodeficiency virus [HIV] disease: Secondary | ICD-10-CM | POA: Diagnosis not present

## 2016-07-11 DIAGNOSIS — K5901 Slow transit constipation: Secondary | ICD-10-CM | POA: Diagnosis not present

## 2016-07-11 DIAGNOSIS — I82403 Acute embolism and thrombosis of unspecified deep veins of lower extremity, bilateral: Secondary | ICD-10-CM | POA: Diagnosis not present

## 2016-07-11 DIAGNOSIS — K219 Gastro-esophageal reflux disease without esophagitis: Secondary | ICD-10-CM

## 2016-07-11 DIAGNOSIS — G629 Polyneuropathy, unspecified: Secondary | ICD-10-CM | POA: Diagnosis not present

## 2016-07-11 DIAGNOSIS — F329 Major depressive disorder, single episode, unspecified: Secondary | ICD-10-CM | POA: Diagnosis not present

## 2016-07-11 DIAGNOSIS — I10 Essential (primary) hypertension: Secondary | ICD-10-CM

## 2016-07-11 DIAGNOSIS — R2681 Unsteadiness on feet: Secondary | ICD-10-CM | POA: Diagnosis not present

## 2016-07-11 NOTE — Progress Notes (Signed)
DATE:  07/11/2016   MRN:  [id number]  BIRTHDAY: 03-09-1951  Facility:  Nursing Home Location:  [place] Room Number: 509-P  LEVEL OF CARE:  SNF (31)  Contact Information    Name Relation Home Work Mobile   Fritz,Tricia Daughter [phone]         Code Status History    Date Active Date Inactive Code Status Order ID Comments User Context   07/02/2016  5:55 PM 07/07/2016  7:17 PM Full Code [id number]  Kary Kos, MD Inpatient   05/13/2016 11:21 PM 05/24/2016  6:36 PM Full Code [id number]  Vianne Bulls, MD Inpatient   07/02/2015  6:35 PM 07/13/2015  5:55 PM Full Code [id number]  Samuella Cota, MD Inpatient   06/18/2015  4:59 PM 06/20/2015  6:26 PM Full Code [id number]  Coralie Common, MD Inpatient   11/02/2013  4:16 PM 11/04/2013  4:56 PM Full Code [id number]  Radene Gunning, NP Inpatient    Advance Directive Documentation   Flowsheet Row Most Recent Value  Type of Advance Directive  Out of facility DNR (pink MOST or yellow form)  Pre-existing out of facility DNR order (yellow form or pink MOST form)  No data  "MOST" Form in Place?  No data       Chief Complaint  Patient presents with  . Discharge Note    HISTORY OF PRESENT ILLNESS:  This is a 1-YO male seen for a discharge visit.  He will discharge to home 07/12/2016 with outpatient therapy services.    He was admitted to [facility] for short-term rehabilitation on 07/07/2016 following an admission at [facility] 07/02/2016-07/07/2016 for spinal stenosis of the thoracolumbar region S/P decompressive laminectomy at T12-L1 and spinal stabilization T10-L2. He has PMH of HIV, hepatitis, chronic renal disease, CLL, PVD with right ICA stenosis, CAD S/P CABG 3, DVT of the left lower extremity diagnosed in 2009,  diabetes mellitus, GERD S/P multiple esophageal dilatation, hiatal hernia with remote surgical treatment, hypertension, RA secondary to syphilis, stroke who underwent complex T12-112  spinal surgery.  He was seen in the room today with his daughter @ bedside. Daughter said that she will be checking on his father everyday to make sure that his needs are met.   PAST MEDICAL HISTORY:  Past Medical History:  Diagnosis Date  . Carotid artery occlusion    40-60% right ICA stenosis (09/2008)  . Chronic back pain    follows with Nsurg  . Chronic back pain   . CLL (chronic lymphoblastic leukemia) dx 2010   Followed at [facility] q82mo no current therapy   . Coronary artery disease 2010   s/p CABG '10, sees Dr. HPercival Spanish . Depression   . Diverticulosis   . DVT, [place] extremity, recurrent [place] [place], [place]   LLE, chronic anticoag since 2009  . Esophagitis   . Fibromyalgia   . Gallstones   . GERD (gastroesophageal reflux disease)   . Gout   . Gynecomastia, male   . H/O hiatal hernia 2008   surgery  . Hemorrhoids   . Hepatitis A yrs ago  . HIV infection ([facility] [place] 1993  . Hypertension   . Impotence of organic origin   . Myocardial infarction 2010    x 2  . Neuromuscular disorder (HCC)    neuropathy  . Osteoarthritis, knee    s/p B TKA  . Pneumonia mrach, may, july 2017  . Rheumatoid arthritis(714.0) dx 2010  MTX, follows with rheum  . Seasonal allergies   . Secondary syphilis 07/24/14 dx   s/p 2 wks doxy  . Status post dilation of esophageal narrowing   . Stroke ([place]) [place]   . TIA (transient ischemic attack) 1997   mild residual L mouth droop  . Tubular adenoma of colon      CURRENT MEDICATIONS: Reviewed  Patient's Medications  New Prescriptions   No medications on file  Previous Medications   ACETAMINOPHEN (TYLENOL) 325 MG TABLET    Take 2 tablets (650 mg total) by mouth every 6 (six) hours as needed for mild pain (or Fever >/= 101).   ASPIRIN EC 81 MG TABLET    Take 81 mg by mouth daily.   ATORVASTATIN (LIPITOR) 10 MG TABLET    TAKE 1 TABLET (10 MG TOTAL) BY MOUTH AT BEDTIME.   CERTOLIZUMAB PEGOL (CIMZIA [place])    Inject into the skin. Pt unsure of  dose. Every four weeks.   CICLOPIROX (LOPROX) 0.77 % SUSP    Apply 1 Act topically 2 (two) times daily.   EDOXABAN (SAVAYSA) 30 MG TABS TABLET    Take 1 tablet (30 mg total) by mouth daily.   ELVITEGRAVIR-COBICISTAT-EMTRICITABINE-TENOFOVIR (GENVOYA) 150-150-200-10 MG TABS TABLET    Take 1 tablet by mouth daily with breakfast.   ESCITALOPRAM (LEXAPRO) 10 MG TABLET    Take 1 tablet (10 mg total) by mouth daily.   FUROSEMIDE (LASIX) 20 MG TABLET    TAKE 1 TABLET (20 MG TOTAL) BY MOUTH 2 (TWO) TIMES DAILY.   HYDROMORPHONE (DILAUDID) 2 MG TABLET    Take 1 tablet (2 mg total) by mouth every 6 (six) hours as needed for severe pain.   LORATADINE (CLARITIN) 10 MG TABLET    TAKE 1 TABLET (10 MG TOTAL) BY MOUTH DAILY.   MENTHOL (ICY HOT) 5 % PTCH    Apply 1 patch topically daily. Apply to right upper arm QAM and remove QHS x10 days   METHOCARBAMOL (ROBAXIN) 500 MG TABLET    Take 1 tablet (500 mg total) by mouth every 8 (eight) hours as needed for muscle spasms.   METOPROLOL SUCCINATE (TOPROL-XL) 25 MG 24 HR TABLET    Take 1 tablet (25 mg total) by mouth daily. Please schedule appointment for refills.   MOMETASONE (NASONEX) 50 MCG/ACT NASAL SPRAY    Place 2 sprays into the nose daily as needed (for nasal congestion.).   ONDANSETRON (ZOFRAN ODT) 4 MG DISINTEGRATING TABLET    Take 1 tablet (4 mg total) by mouth every 8 (eight) hours as needed for nausea or vomiting.   PANTOPRAZOLE (PROTONIX) 40 MG TABLET    Take 1 tablet (40 mg total) by mouth 2 (two) times daily before a meal.   POLYETHYLENE GLYCOL (MIRALAX / GLYCOLAX) PACKET    Take 17 g by mouth daily as needed for mild constipation.   PREDNISONE (DELTASONE) 5 MG TABLET    Take 5 mg by mouth daily with breakfast.    PREGABALIN (LYRICA) 200 MG CAPSULE    Take 1 capsule (200 mg total) by mouth 2 (two) times daily.   PREZISTA 800 MG TABLET    TAKE 1 TABLET (800 MG TOTAL) BY MOUTH DAILY.   PREZISTA 800 MG TABLET    TAKE 1 TABLET (800 MG TOTAL) BY MOUTH DAILY.    SULFAMETHOXAZOLE-TRIMETHOPRIM (BACTRIM DS,SEPTRA DS) 800-160 MG TABLET    TAKE 1 TABLET BY MOUTH EVERY 12 (TWELVE) HOURS.   VALACYCLOVIR (VALTREX) 500 MG  TABLET    TAKE 1 TABLET BY MOUTH EVERY DAY  Modified Medications   No medications on file  Discontinued Medications   OXYCODONE (OXY IR/ROXICODONE) 5 MG IMMEDIATE RELEASE TABLET    Take 1-2 tablets (5-10 mg total) by mouth every 3 (three) hours as needed for breakthrough pain.     Allergies  Allergen Reactions  . Other Anaphylaxis and Hives    Pecan  . Peanut-Containing Drug Products Anaphylaxis, Hives and Swelling    Swelling of throat  . Morphine Other (See Comments)    REACTION: severe headache  . Oxycodone-Acetaminophen Other (See Comments)    REACTION: headache  . Penicillins Rash and Other (See Comments)    FLUSHED, RED  Has patient had a PCN reaction causing immediate rash, facial/tongue/throat swelling, SOB or lightheadedness with hypotension: #  #  #  YES  #  #  #  Has patient had a PCN reaction causing severe rash involving mucus membranes or skin necrosis: No Has patient had a PCN reaction that required hospitalization No Has patient had a PCN reaction occurring within the last 10 years: No If all of the above answers are "NO", then may proceed with Cephalosporin use  . Promethazine Hcl Other (See Comments)    REACTION: makes him feel drunk     REVIEW OF SYSTEMS:  GENERAL: no change in appetite, no fatigue, no weight changes, no fever, chills or weakness EYES: Denies change in vision, dry eyes, eye pain, itching or discharge EARS: Denies change in hearing, ringing in ears, or earache NOSE: Denies nasal congestion or epistaxis MOUTH and THROAT: Denies oral discomfort, gingival pain or bleeding, pain from teeth or hoarseness   RESPIRATORY: no cough, SOB, DOE, wheezing, hemoptysis CARDIAC: no chest pain, edema or palpitations GI: no abdominal pain, diarrhea, constipation, heart burn, nausea or vomiting GU: Denies  dysuria, frequency, hematuria, incontinence, or discharge PSYCHIATRIC: Denies feeling of depression or anxiety. No report of hallucinations, insomnia, paranoia, or agitation    PHYSICAL EXAMINATION  GENERAL APPEARANCE: Well nourished. In no acute distress. Normal body habitus SKIN:  Midline spinal surgical incision is, per grid honeycomb dressing HEAD: Normal in size and contour. No evidence of trauma EYES: Lids open and close normally. No blepharitis, entropion or ectropion. PERRL. Conjunctivae are clear and sclerae are white. Lenses are without opacity EARS: Pinnae are normal. Patient hears normal voice tunes of the examiner MOUTH and THROAT: Lips are without lesions. Oral mucosa is moist and without lesions. Tongue is normal in shape, size, and color and without lesions NECK: supple, trachea midline, no neck masses, no thyroid tenderness, no thyromegaly LYMPHATICS: no LAN in the neck, no supraclavicular LAN RESPIRATORY: breathing is even & unlabored, BS CTAB CARDIAC: RRR, no murmur,no extra heart sounds, no edema GI: abdomen soft, normal BS, no masses, no tenderness, no hepatomegaly, no splenomegaly EXTREMITIES:  Able to move X 4 extremities; has back brace PSYCHIATRIC: Alert and oriented X 3. Affect and behavior are appropriate   LABS/RADIOLOGY: Labs reviewed: Basic Metabolic Panel:  Recent Labs  05/23/16 0547 06/25/16 0939 07/04/16 0749 07/09/16  NA 135 140 136 139  K 4.0 3.8 3.6 3.7  CL 98* 104 101  --   CO2 _0 --   GLUCOSE 93 115* 121*  --   BUN _1 CREATININE 0.92 1.05 0.88 0.9  CALCIUM 8.7* 8.7* 8.1*  --    Liver Function Tests:  Recent Labs  02/18/16 1537 05/13/16 1405 06/25/16 9563  AST 26 36 30  ALT _0 ALKPHOS 55 51 107  BILITOT 0.7 0.9 0.9  PROT 5.7* 5.3* 5.7*  ALBUMIN 3.9 3.1* 3.8    Recent Labs  05/13/16 1405  LIPASE 12   CBC:  Recent Labs  11/29/15 0857  05/13/16 1405  05/23/16 0547 06/25/16 0939 07/04/16 0749  07/09/16  WBC 14.3*  < > 10.3  < > 12.3* 19.9* 23.1* 14.6  NEUTROABS 2.2  --  3.6  --   --   --  11.1*  --   HGB 13.2  < > 12.2*  < > 11.5* 13.9 9.6* 9.2*  HCT 40.6  < > 37.4*  < > 35.6* 43.8 30.5* 29*  MCV 91.2  < > 86.2  < > 84.8 89.2 88.7  --   PLT 130*  < > 117*  < > 264 153 150 173  < > = values in this interval not displayed. Lipid Panel:  Recent Labs  08/30/15 1538  HDL 29*   Cardiac Enzymes:  Recent Labs  05/14/16 2101 05/15/16 0248 05/15/16 0831  TROPONINI <0.03 <0.03 <0.03   CBG:  Recent Labs  05/21/16 0809 05/22/16 0645 05/24/16 0627  GLUCAP 89 91 93      Dg Thoracolumabar Spine  Result Date: 07/02/2016 CLINICAL DATA:  Thoracolumbar spine fusion. EXAM: DG C-ARM 61-120 MIN; THORACOLUMBAR SPINE - 2 VIEW COMPARISON:  05/20/2016. FINDINGS: Pedicle screws noted in lower thoracic vertebral bodies. These appear to be intact. lower most screw is noted in a transverse orientation on AP view. Clinical correlation suggested. Two images obtained. 0 minutes 46 seconds fluoroscopy time . IMPRESSION: Pedicle screws noted in  Lower thoracic vertebral bodies as above. Electronically Signed   By: Marcello Moores  Register   On: 07/02/2016 15:18   Dg C-arm 1-60 Min  Result Date: 07/02/2016 CLINICAL DATA:  Thoracolumbar spine fusion. EXAM: DG C-ARM 61-120 MIN; THORACOLUMBAR SPINE - 2 VIEW COMPARISON:  05/20/2016. FINDINGS: Pedicle screws noted in lower thoracic vertebral bodies. These appear to be intact. lower most screw is noted in a transverse orientation on AP view. Clinical correlation suggested. Two images obtained. 0 minutes 46 seconds fluoroscopy time . IMPRESSION: Pedicle screws noted in  Lower thoracic vertebral bodies as above. Electronically Signed   By: Marcello Moores  Register   On: 07/02/2016 15:18    ASSESSMENT/PLAN:  Unsteady gait - for outpatient therapy services, for therapeutic strengthening exercises; fall precautions  Spinal stenosis thoracolumbar region S/P decompression  stabilization procedure at T11-12 and T10-L2 -  for outpatient therapy services, for therapeutic strengthening exercises; continue Dilaudid 2 mg 1 tab by mouth every 6 hours when necessary for pain; Robaxin 500 mg 1 tab by mouth every 8 hours when necessary for muscle spasm; follow-up with Dr. Kary Kos, neurosurgery, in 2 weeks  History of recurrent DVT -  continue Savaysa 30 mg 1 tab PO Q D,   Hyperlipidemia - continue atorvastatin 10 mg 1 tab by mouth daily at bedtime  HIV - continue Gevoya and Prezista, Bactrim DS 1 tab by mouth every 12 hours follows up with Dr. [name]  Depression - continue Lexapro 10 mg 1 tab by mouth daily  Hypertension - continue Lasix 20 mg 1 tab by mouth twice a day and Toprol-XL 25 mg 1 tab by mouth daily  GERD - continue Protonix 40 mg 1 tab by mouth twice a day  Constipation - continue MiraLAX 17 g by mouth daily when necessary  Neuropathy - continue Lyrica 200 mg  1 capsule by mouth twice a day  Herpes Zoster - continue Valtrex 500 mg 1 tab by mouth daily  Anemia, acute blood loss - stable Lab Results  Component Value Date   HGB 9.2 (A) 07/09/2016      I have filled out patient's discharge paperwork and written prescriptions.  Patient will rhave outpatient services.  DME provided:  None  Total discharge time: Greater than 30 minutes Greater than 50% was spent in counseling and coordination of care with the patient.   Discharge time involved coordination of the discharge process with social worker, nursing staff and therapy department. Medical justification for home health services verified.   Monina C. Tillson - NP    [employer] [phone]

## 2016-07-22 DIAGNOSIS — M0609 Rheumatoid arthritis without rheumatoid factor, multiple sites: Secondary | ICD-10-CM | POA: Diagnosis not present

## 2016-07-22 DIAGNOSIS — Z79899 Other long term (current) drug therapy: Secondary | ICD-10-CM | POA: Diagnosis not present

## 2016-07-22 LAB — CBC AND DIFFERENTIAL
HCT: 34 % — AB (ref 41–53)
Hemoglobin: 10.3 g/dL — AB (ref 13.5–17.5)
Neutrophils Absolute: 4 /uL
Platelets: 224 10*3/uL (ref 150–399)
WBC: 12.1 10^3/mL

## 2016-08-01 LAB — FUNGUS CULTURE WITH STAIN

## 2016-08-01 LAB — FUNGUS CULTURE RESULT

## 2016-08-01 LAB — FUNGAL ORGANISM REFLEX

## 2016-08-05 DIAGNOSIS — M4120 Other idiopathic scoliosis, site unspecified: Secondary | ICD-10-CM | POA: Diagnosis not present

## 2016-08-05 DIAGNOSIS — M5136 Other intervertebral disc degeneration, lumbar region: Secondary | ICD-10-CM | POA: Diagnosis not present

## 2016-08-05 DIAGNOSIS — M5023 Other cervical disc displacement, cervicothoracic region: Secondary | ICD-10-CM | POA: Diagnosis not present

## 2016-08-06 ENCOUNTER — Encounter: Payer: Self-pay | Admitting: *Deleted

## 2016-08-06 ENCOUNTER — Telehealth: Payer: Self-pay | Admitting: Oncology

## 2016-08-06 NOTE — Telephone Encounter (Signed)
Called patient to inform him of next scheduled appointments. °

## 2016-08-12 ENCOUNTER — Encounter: Payer: Self-pay | Admitting: Internal Medicine

## 2016-08-12 LAB — QUANTIFERON TB GOLD ASSAY (BLOOD): QUANTIFERON TB GOLD: NEGATIVE

## 2016-08-13 ENCOUNTER — Telehealth: Payer: Self-pay | Admitting: Oncology

## 2016-08-13 ENCOUNTER — Ambulatory Visit (HOSPITAL_BASED_OUTPATIENT_CLINIC_OR_DEPARTMENT_OTHER): Payer: Medicare Other | Admitting: Oncology

## 2016-08-13 ENCOUNTER — Other Ambulatory Visit (HOSPITAL_BASED_OUTPATIENT_CLINIC_OR_DEPARTMENT_OTHER): Payer: Medicare Other

## 2016-08-13 VITALS — BP 128/64 | HR 75 | Temp 97.8°F | Resp 18 | Ht 67.0 in | Wt 195.5 lb

## 2016-08-13 DIAGNOSIS — D72820 Lymphocytosis (symptomatic): Secondary | ICD-10-CM

## 2016-08-13 DIAGNOSIS — IMO0001 Reserved for inherently not codable concepts without codable children: Secondary | ICD-10-CM

## 2016-08-13 DIAGNOSIS — Z856 Personal history of leukemia: Secondary | ICD-10-CM

## 2016-08-13 LAB — CBC WITH DIFFERENTIAL/PLATELET
BASO%: 0.4 % (ref 0.0–2.0)
Basophils Absolute: 0.1 10*3/uL (ref 0.0–0.1)
EOS%: 2.1 % (ref 0.0–7.0)
Eosinophils Absolute: 0.3 10*3/uL (ref 0.0–0.5)
HCT: 34.7 % — ABNORMAL LOW (ref 38.4–49.9)
HGB: 10.6 g/dL — ABNORMAL LOW (ref 13.0–17.1)
LYMPH%: 71.5 % — ABNORMAL HIGH (ref 14.0–49.0)
MCH: 25.2 pg — ABNORMAL LOW (ref 27.2–33.4)
MCHC: 30.5 g/dL — ABNORMAL LOW (ref 32.0–36.0)
MCV: 82.6 fL (ref 79.3–98.0)
MONO#: 0.9 10*3/uL (ref 0.1–0.9)
MONO%: 6.5 % (ref 0.0–14.0)
NEUT#: 2.6 10*3/uL (ref 1.5–6.5)
NEUT%: 19.5 % — ABNORMAL LOW (ref 39.0–75.0)
Platelets: 167 10*3/uL (ref 140–400)
RBC: 4.2 10*6/uL (ref 4.20–5.82)
RDW: 16.4 % — ABNORMAL HIGH (ref 11.0–14.6)
WBC: 13.2 10*3/uL — ABNORMAL HIGH (ref 4.0–10.3)
lymph#: 9.4 10*3/uL — ABNORMAL HIGH (ref 0.9–3.3)
nRBC: 0 % (ref 0–0)

## 2016-08-13 LAB — COMPREHENSIVE METABOLIC PANEL
ALT: 18 U/L (ref 0–55)
AST: 21 U/L (ref 5–34)
Albumin: 3.9 g/dL (ref 3.5–5.0)
Alkaline Phosphatase: 114 U/L (ref 40–150)
Anion Gap: 8 mEq/L (ref 3–11)
BUN: 18.7 mg/dL (ref 7.0–26.0)
CO2: 27 mEq/L (ref 22–29)
Calcium: 8.8 mg/dL (ref 8.4–10.4)
Chloride: 105 mEq/L (ref 98–109)
Creatinine: 1.1 mg/dL (ref 0.7–1.3)
EGFR: 72 mL/min/{1.73_m2} — ABNORMAL LOW (ref >90)
Glucose: 86 mg/dl (ref 70–140)
Potassium: 3.7 mEq/L (ref 3.5–5.1)
Sodium: 140 mEq/L (ref 136–145)
Total Bilirubin: 0.59 mg/dL (ref 0.20–1.20)
Total Protein: 5.9 g/dL — ABNORMAL LOW (ref 6.4–8.3)

## 2016-08-13 LAB — LACTATE DEHYDROGENASE: LDH: 266 U/L — ABNORMAL HIGH (ref 125–245)

## 2016-08-13 LAB — TECHNOLOGIST REVIEW

## 2016-08-13 NOTE — Telephone Encounter (Signed)
Appointments scheduled per 4.4.18 LOS. Patient given AVS report and calendars with future scheduled appointments. °

## 2016-08-13 NOTE — Progress Notes (Signed)
Hematology and Oncology Follow Up Visit  [name] [id number] 1951/03/15 66 y.o. 08/13/2016 9:45 AM [name], MDJones, [name], MD   Principle Diagnosis: 66 year old gentleman with CLL diagnosed in 2007 at [facility].  Current therapy: Observation and surveillance.  Interim History:  [name] presents today for a followup visit. Since his last visit, he underwent lumbar spine operation because of consistent back pain issues. On 07/02/2016 he underwent a decompressive thoracic and lumbar laminectomy care of Dr. Saintclair Halsted. Patient was reasonably well and his pain have improved dramatically. He is able to ambulate without any major difficulties and did not report any falls or syncope. Prior to his operations, he had presented to the emergency department and required hospitalization for intractable pain.  He continues to feel reasonably well as of late. His appetite remains excellent and has gained more weight. He denied any fevers or chills or sweats. Has not reported any recurrent infections.   He does not report any blurry vision or double vision. Does not report any syncope or seizures. Report any chest pain or shortness of breath. Does not report any cough or hemoptysis. His appetite and weight have been stable. Has not reported any change in his bowel habits such as constipation or diarrhea. He is not reporting any urinary symptoms. Rest of his review of systems unremarkable.  Medications: I have reviewed the patient's current medications.  Current Outpatient Prescriptions  Medication Sig Dispense Refill  . acetaminophen (TYLENOL) 325 MG tablet Take 2 tablets (650 mg total) by mouth every 6 (six) hours as needed for mild pain (or Fever >/= 101). 30 tablet 0  . aspirin EC 81 MG tablet Take 81 mg by mouth daily.    [name] atorvastatin (LIPITOR) 10 MG tablet TAKE 1 TABLET (10 MG TOTAL) BY MOUTH AT BEDTIME. 90 tablet 2  . Certolizumab Pegol (CIMZIA [place]) Inject into the skin. Pt  unsure of dose. Every four weeks.    . ciclopirox (LOPROX) 0.77 % SUSP Apply 1 Act topically 2 (two) times daily. 60 mL 5  . edoxaban (SAVAYSA) 30 MG TABS tablet Take 1 tablet (30 mg total) by mouth daily. 90 tablet 1  . elvitegravir-cobicistat-emtricitabine-tenofovir (GENVOYA) 150-150-200-10 MG TABS tablet Take 1 tablet by mouth daily with breakfast. 30 tablet 5  . escitalopram (LEXAPRO) 10 MG tablet Take 1 tablet (10 mg total) by mouth daily. 90 tablet 3  . furosemide (LASIX) 20 MG tablet TAKE 1 TABLET (20 MG TOTAL) BY MOUTH 2 (TWO) TIMES DAILY. 60 tablet 5  . HYDROmorphone (DILAUDID) 2 MG tablet Take 1 tablet (2 mg total) by mouth every 6 (six) hours as needed for severe pain. 100 tablet 0  . loratadine (CLARITIN) 10 MG tablet TAKE 1 TABLET (10 MG TOTAL) BY MOUTH DAILY. 90 tablet 3  . Menthol (ICY HOT) 5 % PTCH Apply 1 patch topically daily. Apply to right upper arm QAM and remove QHS x10 days    . methocarbamol (ROBAXIN) 500 MG tablet Take 1 tablet (500 mg total) by mouth every 8 (eight) hours as needed for muscle spasms. 75 tablet 4  . metoprolol succinate (TOPROL-XL) 25 MG 24 hr tablet Take 1 tablet (25 mg total) by mouth daily. Please schedule appointment for refills. 30 tablet 6  . mometasone (NASONEX) 50 MCG/ACT nasal spray Place 2 sprays into the nose daily as needed (for nasal congestion.).    [name] ondansetron (ZOFRAN ODT) 4 MG disintegrating tablet Take 1 tablet (4 mg total) by mouth every  8 (eight) hours as needed for nausea or vomiting. 20 tablet 0  . pantoprazole (PROTONIX) 40 MG tablet Take 1 tablet (40 mg total) by mouth 2 (two) times daily before a meal. 60 tablet 3  . polyethylene glycol (MIRALAX / GLYCOLAX) packet Take 17 g by mouth daily as needed for mild constipation. 14 each 0  . predniSONE (DELTASONE) 5 MG tablet Take 5 mg by mouth daily with breakfast.     . pregabalin (LYRICA) 200 MG capsule Take 1 capsule (200 mg total) by mouth 2 (two) times daily. 60 capsule 1  . PREZISTA  800 MG tablet TAKE 1 TABLET (800 MG TOTAL) BY MOUTH DAILY. 30 tablet 5  . sulfamethoxazole-trimethoprim (BACTRIM DS,SEPTRA DS) 800-160 MG tablet TAKE 1 TABLET BY MOUTH EVERY 12 (TWELVE) HOURS. 60 tablet 3  . valACYclovir (VALTREX) 500 MG tablet TAKE 1 TABLET BY MOUTH EVERY DAY 90 tablet 3   No current facility-administered medications for this visit.      Allergies:  Allergies  Allergen Reactions  . Other Anaphylaxis and Hives    Pecan  . Peanut-Containing Drug Products Anaphylaxis, Hives and Swelling    Swelling of throat  . Morphine Other (See Comments)    REACTION: severe headache  . Oxycodone-Acetaminophen Other (See Comments)    REACTION: headache  . Penicillins Rash and Other (See Comments)    FLUSHED, RED  Has patient had a PCN reaction causing immediate rash, facial/tongue/throat swelling, SOB or lightheadedness with hypotension: #  #  #  YES  #  #  #  Has patient had a PCN reaction causing severe rash involving mucus membranes or skin necrosis: No Has patient had a PCN reaction that required hospitalization No Has patient had a PCN reaction occurring within the last 10 years: No If all of the above answers are "NO", then may proceed with Cephalosporin use  . Promethazine Hcl Other (See Comments)    REACTION: makes him feel drunk    Past Medical History, Surgical history, Social history, and Family History were reviewed and updated.   Physical Exam: Blood pressure 128/64, pulse 75, temperature 97.8 F (36.6 C), temperature source Oral, resp. rate 18, height 5\' 7"  (1.702 m), weight 195 lb 8 oz (88.7 kg), SpO2 98 %. ECOG: 1 General appearance: Alert, awake gentleman without distress. Head: Normocephalic, without obvious abnormality no oral ulcers or lesions. Neck: no adenopathy Lymph nodes: Cervical, supraclavicular, and axillary nodes normal. Heart:regular rate and rhythm, S1, S2 normal, no murmur, click, rub or gallop Lung:chest clear, no wheezing, rales, normal  symmetric air entry.  Abdomin: soft, non-tender, without masses or organomegaly. No rebound or guarding. EXT:no erythema, induration, or nodules   Lab Results: Lab Results  Component Value Date   WBC 12.1 07/22/2016   HGB 10.3 (A) 07/22/2016   HCT 34 (A) 07/22/2016   MCV 88.7 07/04/2016   PLT 224 07/22/2016     Chemistry      Component Value Date/Time   NA 139 07/09/2016   NA 141 11/29/2015 0857   K 3.7 07/09/2016   K 3.9 11/29/2015 0857   CL 101 07/04/2016 0749   CO2 28 07/04/2016 0749   CO2 30 (H) 11/29/2015 0857   BUN 13 07/09/2016   BUN 23.0 11/29/2015 0857   CREATININE 0.9 07/09/2016   CREATININE 0.88 07/04/2016 0749   CREATININE 1.08 02/18/2016 1537   CREATININE 1.1 11/29/2015 0857   GLU 92 07/09/2016      Component Value Date/Time   CALCIUM 8.1 (  L) 07/04/2016 0749   CALCIUM 8.4 11/29/2015 0857   ALKPHOS 107 06/25/2016 0939   ALKPHOS 68 11/29/2015 0857   AST 30 06/25/2016 0939   AST 25 11/29/2015 0857   ALT 25 06/25/2016 0939   ALT 30 11/29/2015 0857   BILITOT 0.9 06/25/2016 0939   BILITOT 0.55 11/29/2015 0857        Impression and Plan:  66 year old gentleman with the following issues:   1. CLL diagnosed in 2007 presented with stage 0 CD38 positive, ZAP 70 positive. He did have some mild lymphadenopathy on CT scan in January 2015 that is mostly subcentimeter with a aortocaval lymph node largest measuring 2.0 x 1.6 cm.   Laboratory data from The month of February and March were reviewed today and showed no dramatic changes in his lymphocytosis. The plan is to continue with observation and surveillance and discuss possible treatment if he develops symptomatic adenopathy, cytopenias oral rapid increase in his white cell count.  2. Chronic back pain: Resolved after his recent operation in February 2018.   3. Follow-up: In 6 months sooner if needed to.  [facility], MD 4/4/20189:45 AM

## 2016-08-14 ENCOUNTER — Ambulatory Visit (INDEPENDENT_AMBULATORY_CARE_PROVIDER_SITE_OTHER): Payer: Medicare Other | Admitting: Infectious Diseases

## 2016-08-14 ENCOUNTER — Encounter: Payer: Self-pay | Admitting: Infectious Diseases

## 2016-08-14 VITALS — BP 124/72 | HR 86 | Temp 97.4°F | Wt 198.0 lb

## 2016-08-14 DIAGNOSIS — M459 Ankylosing spondylitis of unspecified sites in spine: Secondary | ICD-10-CM

## 2016-08-14 DIAGNOSIS — I10 Essential (primary) hypertension: Secondary | ICD-10-CM

## 2016-08-14 DIAGNOSIS — Z79899 Other long term (current) drug therapy: Secondary | ICD-10-CM

## 2016-08-14 DIAGNOSIS — J8410 Pulmonary fibrosis, unspecified: Secondary | ICD-10-CM | POA: Diagnosis not present

## 2016-08-14 DIAGNOSIS — Z113 Encounter for screening for infections with a predominantly sexual mode of transmission: Secondary | ICD-10-CM | POA: Diagnosis not present

## 2016-08-14 DIAGNOSIS — B20 Human immunodeficiency virus [HIV] disease: Secondary | ICD-10-CM

## 2016-08-14 DIAGNOSIS — IMO0001 Reserved for inherently not codable concepts without codable children: Secondary | ICD-10-CM

## 2016-08-14 DIAGNOSIS — C91Z0 Other lymphoid leukemia not having achieved remission: Secondary | ICD-10-CM | POA: Diagnosis not present

## 2016-08-14 LAB — LIPID PANEL
Cholesterol: 130 mg/dL (ref <200)
HDL: 39 mg/dL — ABNORMAL LOW (ref >40)
LDL Cholesterol: 61 mg/dL (ref <100)
Total CHOL/HDL Ratio: 3.3 Ratio (ref <5.0)
Triglycerides: 149 mg/dL (ref <150)
VLDL: 30 mg/dL (ref <30)

## 2016-08-14 NOTE — Progress Notes (Signed)
   Subjective:    Patient ID: [name], male    DOB: [birth year], 66 y.o.   MRN: [id number]  HPI 66 yo M with hx of HIV+, CLL (dx 2007), pulmonary fibrosis, and RA for which he takes MTX.  He also has a hx of esophageal strictures dilated by EGD every 1.5 yrs.  2-202-18 he underwent lumbar laminectomy. He has been feeling much better since. No pain.  Breathing has been good, stas stable. Gets "winded easily".  Saw Onc recently- told that he is doing well. His WBC is back to baseline.  He is currently on genvoya/darunavir.   HIV 1 RNA Quant (copies/mL)  Date Value  02/18/2016 31 (H)  08/30/2015 <20  07/06/2015 100   CD4 T Cell Abs (/uL)  Date Value  02/18/2016 1,140  08/30/2015 230 (L)  07/06/2015 870    Review of Systems  Constitutional: Negative for appetite change, chills, fever and unexpected weight change.  HENT: Negative for trouble swallowing.   Respiratory: Positive for shortness of breath.   Gastrointestinal: Negative for constipation and diarrhea.  Genitourinary: Negative for difficulty urinating.  Musculoskeletal: Negative for back pain.  arthritis has been well controlled- has noted less strength and less coordination. Needs help opening jars/bottles. Can't tolerate cold o/w no pain.  Please see HPI. 12 point ROS o/w (-)      Objective:   Physical Exam  Constitutional: He appears well-developed and well-nourished.  HENT:  Mouth/Throat: No oropharyngeal exudate.  Eyes: EOM are normal. Pupils are equal, round, and reactive to light.  Neck: Neck supple.  Cardiovascular: Normal rate, regular rhythm and normal heart sounds.   Pulmonary/Chest: Effort normal and breath sounds normal.  Abdominal: Soft. Bowel sounds are normal. There is no tenderness. There is no rebound.  Musculoskeletal: He exhibits deformity. He exhibits no edema.  Lymphadenopathy:    He has no cervical adenopathy.  Psychiatric: He has a normal mood and affect.      Assessment & Plan:

## 2016-08-14 NOTE — Assessment & Plan Note (Signed)
Recent h/o eval.  At his baseline, appreciate their f/u.

## 2016-08-14 NOTE — Assessment & Plan Note (Signed)
Well controlled.  Cont to f/u with PCP.

## 2016-08-14 NOTE — Assessment & Plan Note (Signed)
He appears to be doing well vax are up to date.  Will check his labs, lipids, today No change in ART, he is tolerating well.  Will see him back in 6 months

## 2016-08-14 NOTE — Assessment & Plan Note (Signed)
Feels like he is at his baseline. No SOB in clinic today.

## 2016-08-14 NOTE — Assessment & Plan Note (Signed)
Fairly well controlled on cimzia.  Will f/u with his rheum md.

## 2016-08-15 LAB — RPR

## 2016-08-15 LAB — T-HELPER CELL (CD4) - (RCID CLINIC ONLY)
CD4 % Helper T Cell: 10 % — ABNORMAL LOW (ref 33–55)
CD4 T Cell Abs: 790 /uL (ref 400–2700)

## 2016-08-18 LAB — HIV-1 RNA QUANT-NO REFLEX-BLD
HIV 1 RNA Quant: <20 copies/mL — AB
HIV-1 RNA Quant, Log: <1.3 Log copies/mL — AB

## 2016-08-21 DIAGNOSIS — M0609 Rheumatoid arthritis without rheumatoid factor, multiple sites: Secondary | ICD-10-CM | POA: Diagnosis not present

## 2016-08-28 DIAGNOSIS — H40013 Open angle with borderline findings, low risk, bilateral: Secondary | ICD-10-CM | POA: Diagnosis not present

## 2016-08-28 DIAGNOSIS — H2513 Age-related nuclear cataract, bilateral: Secondary | ICD-10-CM | POA: Diagnosis not present

## 2016-08-28 DIAGNOSIS — H31092 Other chorioretinal scars, left eye: Secondary | ICD-10-CM | POA: Diagnosis not present

## 2016-09-01 ENCOUNTER — Other Ambulatory Visit: Payer: Self-pay | Admitting: Infectious Diseases

## 2016-09-01 DIAGNOSIS — M792 Neuralgia and neuritis, unspecified: Secondary | ICD-10-CM

## 2016-09-22 DIAGNOSIS — M0609 Rheumatoid arthritis without rheumatoid factor, multiple sites: Secondary | ICD-10-CM | POA: Diagnosis not present

## 2016-09-27 ENCOUNTER — Other Ambulatory Visit: Payer: Self-pay | Admitting: Internal Medicine

## 2016-10-07 DIAGNOSIS — M5136 Other intervertebral disc degeneration, lumbar region: Secondary | ICD-10-CM | POA: Diagnosis not present

## 2016-10-07 DIAGNOSIS — M4120 Other idiopathic scoliosis, site unspecified: Secondary | ICD-10-CM | POA: Diagnosis not present

## 2016-10-08 ENCOUNTER — Other Ambulatory Visit: Payer: Self-pay | Admitting: Cardiology

## 2016-10-08 NOTE — Telephone Encounter (Signed)
Rx has been sent to the pharmacy electronically. ° °

## 2016-10-11 NOTE — Addendum Note (Signed)
Addendum  created 10/11/16 7670 by Duane Boston, MD   Sign clinical note

## 2016-10-18 ENCOUNTER — Other Ambulatory Visit: Payer: Self-pay | Admitting: Internal Medicine

## 2016-10-23 DIAGNOSIS — M0609 Rheumatoid arthritis without rheumatoid factor, multiple sites: Secondary | ICD-10-CM | POA: Diagnosis not present

## 2016-10-27 ENCOUNTER — Other Ambulatory Visit: Payer: Self-pay | Admitting: Internal Medicine

## 2016-10-27 DIAGNOSIS — B59 Pneumocystosis: Secondary | ICD-10-CM

## 2016-10-30 DIAGNOSIS — M216X2 Other acquired deformities of left foot: Secondary | ICD-10-CM | POA: Diagnosis not present

## 2016-10-30 DIAGNOSIS — M7742 Metatarsalgia, left foot: Secondary | ICD-10-CM | POA: Diagnosis not present

## 2016-11-12 ENCOUNTER — Encounter: Payer: Self-pay | Admitting: Cardiology

## 2016-11-12 NOTE — Progress Notes (Signed)
HPI The patient presented recently for followup of his known coronary disease.   He has had progressive dyspnea.  He did have a negative stress perfusion study for any evidence of reversible ischemia in 2016 although there was a fixed defect. His ejection fractions well-preserved. It was well preserved on echo with some tricuspid regurgitation.  He did have some slight abnormalities on pulmonary function testing and has seen [name].  He had some interstitial lung disease thought possibly to be related to his rheumatoid arthritis.   Since I last saw him he had lumbar laminectomy and had improved in gait and leg weakness.    The patient denies any new symptoms such as chest discomfort, neck or arm discomfort. There has been no new shortness of breath, PND or orthopnea. There have been no reported palpitations, presyncope or syncope.  He is able to do his yard work and walks with a cane for support.    Allergies  Allergen Reactions  . Other Anaphylaxis and Hives    Pecan  . Peanut-Containing Drug Products Anaphylaxis, Hives and Swelling    Swelling of throat  . Morphine Other (See Comments)    REACTION: severe headache  . Oxycodone-Acetaminophen Other (See Comments)    REACTION: headache  . Penicillins Rash and Other (See Comments)    FLUSHED, RED  Has patient had a PCN reaction causing immediate rash, facial/tongue/throat swelling, SOB or lightheadedness with hypotension: #  #  #  YES  #  #  #  Has patient had a PCN reaction causing severe rash involving mucus membranes or skin necrosis: No Has patient had a PCN reaction that required hospitalization No Has patient had a PCN reaction occurring within the last 10 years: No If all of the above answers are "NO", then may proceed with Cephalosporin use  . Promethazine Hcl Other (See Comments)    REACTION: makes him feel drunk    Current Outpatient Prescriptions  Medication Sig Dispense Refill  . acetaminophen (TYLENOL) 325 MG tablet  Take 2 tablets (650 mg total) by mouth every 6 (six) hours as needed for mild pain (or Fever >/= 101). 30 tablet 0  . atorvastatin (LIPITOR) 10 MG tablet TAKE 1 TABLET (10 MG TOTAL) BY MOUTH AT BEDTIME. 90 tablet 2  . Certolizumab Pegol (CIMZIA [place]) Inject into the skin. Pt unsure of dose. Every four weeks.    . ciclopirox (LOPROX) 0.77 % SUSP Apply 1 Act topically 2 (two) times daily. 60 mL 5  . clindamycin (CLEOCIN) 150 MG capsule TAKE 4 CAPSULES BY MOUTH ONE FOUR BEFORE APPOINTMENT  2  . edoxaban (SAVAYSA) 30 MG TABS tablet Take 1 tablet (30 mg total) by mouth daily. 90 tablet 1  . elvitegravir-cobicistat-emtricitabine-tenofovir (GENVOYA) 150-150-200-10 MG TABS tablet Take 1 tablet by mouth daily with breakfast. 30 tablet 5  . escitalopram (LEXAPRO) 10 MG tablet Take 1 tablet (10 mg total) by mouth daily. 90 tablet 3  . furosemide (LASIX) 20 MG tablet TAKE 1 TABLET (20 MG TOTAL) BY MOUTH 2 (TWO) TIMES DAILY. 60 tablet 5  . HYDROmorphone (DILAUDID) 2 MG tablet Take 1 tablet (2 mg total) by mouth every 6 (six) hours as needed for severe pain. 100 tablet 0  . loratadine (CLARITIN) 10 MG tablet TAKE 1 TABLET (10 MG TOTAL) BY MOUTH DAILY. 90 tablet 3  . LYRICA 200 MG capsule TAKE 1 CAPSULE BY MOUTH TWICE DAILY 60 capsule 2  . Menthol (ICY HOT) 5 % PTCH Apply 1 patch topically  daily. Apply to right upper arm QAM and remove QHS x10 days    . methocarbamol (ROBAXIN) 500 MG tablet Take 1 tablet (500 mg total) by mouth every 8 (eight) hours as needed for muscle spasms. 75 tablet 4  . metoprolol succinate (TOPROL-XL) 25 MG 24 hr tablet Take 1 tablet (25 mg total) by mouth daily. Need appointment before anymore refills 90 tablet 0  . mometasone (NASONEX) 50 MCG/ACT nasal spray Place 2 sprays into the nose daily as needed (for nasal congestion.).    [name] ondansetron (ZOFRAN ODT) 4 MG disintegrating tablet Take 1 tablet (4 mg total) by mouth every 8 (eight) hours as needed for nausea or vomiting. 20 tablet 0  .  pantoprazole (PROTONIX) 40 MG tablet Take 1 tablet (40 mg total) by mouth 2 (two) times daily before a meal. 60 tablet 3  . polyethylene glycol (MIRALAX / GLYCOLAX) packet Take 17 g by mouth daily as needed for mild constipation. 14 each 0  . predniSONE (DELTASONE) 5 MG tablet Take 5 mg by mouth daily.  1  . PREZISTA 800 MG tablet TAKE 1 TABLET (800 MG TOTAL) BY MOUTH DAILY. 30 tablet 5  . sulfamethoxazole-trimethoprim (BACTRIM DS,SEPTRA DS) 800-160 MG tablet TAKE 1 TABLET BY MOUTH EVERY 12 (TWELVE) HOURS. 60 tablet 3  . valACYclovir (VALTREX) 500 MG tablet TAKE 1 TABLET BY MOUTH EVERY DAY 90 tablet 3   No current facility-administered medications for this visit.     Past Medical History:  Diagnosis Date  . Carotid artery occlusion    40-60% right ICA stenosis (09/2008)  . Chronic back pain   . CLL (chronic lymphoblastic leukemia) dx 2010   [facility], no current therapy   . Coronary artery disease 2010   s/p CABG '10, sees Dr. Percival Spanish  . Depression   . Diverticulosis   . DVT, lower extremity, recurrent ([place]) 2008, 2009   LLE, chronic anticoag since 2009  . Esophagitis   . Fibromyalgia   . Gallstones   . GERD (gastroesophageal reflux disease)   . Gout   . Gynecomastia, male   . H/O hiatal hernia 2008   surgery  . Hemorrhoids   . Hepatitis A yrs ago  . HIV infection ([place]) dx 1993  . Hypertension   . Impotence of organic origin   . Myocardial infarction ([place]) 2010    x 2  . Neuromuscular disorder (HCC)    neuropathy  . Osteoarthritis, knee    s/p B TKA  . Pneumonia mrach, may, july 2017  . Rheumatoid arthritis(714.0) dx 2010   MTX, follows with rheum  . Seasonal allergies   . Secondary syphilis 07/24/14 dx   s/p 2 wks doxy  . Status post dilation of esophageal narrowing   . Stroke ([place]) [place]   . TIA (transient ischemic attack) 1997   mild residual L mouth droop  . Tubular adenoma of colon     Past Surgical History:  Procedure Laterality Date  .  CHOLECYSTECTOMY    . COLONOSCOPY WITH PROPOFOL N/A 12/28/2012   Procedure: COLONOSCOPY WITH PROPOFOL;  Surgeon: Jerene Bears, MD;  [facility];  Service: Gastroenterology;  Laterality: N/A;  . CORONARY ARTERY BYPASS GRAFT  2010   triple bypass  . ESOPHAGOGASTRODUODENOSCOPY (EGD) WITH PROPOFOL N/A 12/28/2012   Procedure: ESOPHAGOGASTRODUODENOSCOPY (EGD) WITH PROPOFOL;  Surgeon: Jerene Bears, MD;  [facility];  Service: Gastroenterology;  Laterality: N/A;  . ESOPHAGOGASTRODUODENOSCOPY (EGD) WITH PROPOFOL N/A 03/15/2013  Procedure: ESOPHAGOGASTRODUODENOSCOPY (EGD) WITH PROPOFOL;  Surgeon: Jerene Bears, MD;  [facility];  Service: Gastroenterology;  Laterality: N/A;  . ESOPHAGOGASTRODUODENOSCOPY (EGD) WITH PROPOFOL N/A 02/07/2016   Procedure: ESOPHAGOGASTRODUODENOSCOPY (EGD) WITH PROPOFOL;  Surgeon: Jerene Bears, MD;  [facility];  Service: Gastroenterology;  Laterality: N/A;  . HARDWARE REMOVAL N/A 07/02/2012   Procedure: HARDWARE REMOVAL;  Surgeon: Elaina Hoops, MD;  Location: [facility];  Service: Neurosurgery;  Laterality: N/A;  . HIATAL HERNIA REPAIR     wrap  . INGUINAL HERNIA REPAIR Bilateral   . JOINT REPLACEMENT Left 1999  . KNEE ARTHROPLASTY  07/22/2011   Procedure: COMPUTER ASSISTED TOTAL KNEE ARTHROPLASTY;  Surgeon: Meredith Pel, MD;  Location: [facility];  Service: Orthopedics;  Laterality: Right;  Right total knee arthroplasty  . MANDIBLE SURGERY Bilateral    tmj  . REPLACEMENT TOTAL KNEE Bilateral   . ring around testicle hernia reapir  184 and 1986   x 2  . ROTATOR CUFF REPAIR Right   . SHOULDER SURGERY Left   . SPINE SURGERY  2010   "rod and screws", "failed", lopwer spine,   . stent to heart x 1  2010  . TONSILLECTOMY    . UMBILICAL HERNIA REPAIR     x 1  . varicose vein     stripping  . VIDEO BRONCHOSCOPY Bilateral 10/16/2015   Procedure: VIDEO BRONCHOSCOPY WITHOUT FLUORO;  Surgeon: Brand Males, MD;  [facility];   Service: Cardiopulmonary;  Laterality: Bilateral;    ROS:  As stated in the HPI and negative for all other systems.  PHYSICAL EXAM BP 122/64   Pulse 64   Ht 5\' 7"  (1.702 m)   Wt 203 lb (92.1 kg)   BMI 31.79 kg/m   GENERAL:  Well appearing HEENT:  Pupils equal round and reactive, fundi not visualized, oral mucosa unremarkable NECK:  No jugular venous distention, waveform within normal limits, carotid upstroke brisk and symmetric, no bruits, no thyromegaly LUNGS:  Clear to auscultation bilaterally BACK:  Lordosis CHEST:  Well healed sternotomy scar. HEART:  PMI not displaced or sustained,S1 and S2 within normal limits, no S3, no S4, no clicks, no rubs, no murmurs ABD:  Flat, positive bowel sounds normal in frequency in pitch, no bruits, no rebound, no guarding, no midline pulsatile mass, no hepatomegaly, no splenomegaly EXT:  2 plus pulses throughout, no edema, no cyanosis no clubbing, arthritic changes.    GENERAL:  Well appearing HEENT:  Pupils equal round and reactive, fundi not visualized, oral mucosa unremarkable NECK:  No jugular venous distention, waveform within normal limits, carotid upstroke brisk and symmetric, no bruits, no thyromegaly LYMPHATICS:  No cervical, inguinal adenopathy LUNGS:  Clear to auscultation bilaterally BACK:  No CVA tenderness, severe lordosis  CHEST:  Well healed sternotomy scar. HEART:  PMI not displaced or sustained,S1 and S2 within normal limits, no S3, no S4, no clicks, no rubs, no murmurs ABD:  Flat, positive bowel sounds normal in frequency in pitch, no bruits, no rebound, no guarding, no midline pulsatile mass, no hepatomegaly, no splenomegaly EXT:  2 plus pulses throughout, mild ankle edema, no cyanosis no clubbing SKIN:  No rash   Lab Results  Component Value Date   CHOL 130 08/14/2016   TRIG 149 08/14/2016   HDL 39 (L) 08/14/2016   [place] 61 08/14/2016     ASSESSMENT AND PLAN   CAD/DYSPNEA  The patient had a negative  perfusion study for reversible ischemia in 2016.  There was a fixed defect. His EF was well-preserved.  He has no new symptoms. No change in therapy is planned other than the fact that he can stop his ASA.    CAROTID STENOSIS He has had minimal carotid plaque in 2013.  I will repeat this study.    HTN His blood pressure is normal.  He can continue therapies as listed.   TR We will follow this clinically.  No change in therapy is indicated.

## 2016-11-13 ENCOUNTER — Ambulatory Visit (INDEPENDENT_AMBULATORY_CARE_PROVIDER_SITE_OTHER): Payer: Medicare Other | Admitting: Cardiology

## 2016-11-13 ENCOUNTER — Encounter: Payer: Self-pay | Admitting: *Deleted

## 2016-11-13 ENCOUNTER — Encounter: Payer: Self-pay | Admitting: Cardiology

## 2016-11-13 ENCOUNTER — Other Ambulatory Visit: Payer: Self-pay | Admitting: *Deleted

## 2016-11-13 VITALS — BP 122/64 | HR 64 | Ht 67.0 in | Wt 203.0 lb

## 2016-11-13 DIAGNOSIS — I779 Disorder of arteries and arterioles, unspecified: Secondary | ICD-10-CM | POA: Diagnosis not present

## 2016-11-13 DIAGNOSIS — I739 Peripheral vascular disease, unspecified: Principal | ICD-10-CM

## 2016-11-13 DIAGNOSIS — I6529 Occlusion and stenosis of unspecified carotid artery: Secondary | ICD-10-CM

## 2016-11-13 NOTE — Patient Instructions (Signed)
Medication Instructions:  STOP aspirin  Follow-Up: Your physician wants you to follow-up in: 1 YEAR with Dr. Percival Spanish. You will receive a reminder letter in the mail two months in advance. If you don't receive a letter, please call our office to schedule the follow-up appointment.   Any Other Special Instructions Will Be Listed Below (If Applicable).     If you need a refill on your cardiac medications before your next appointment, please call your pharmacy.

## 2016-11-20 DIAGNOSIS — M0609 Rheumatoid arthritis without rheumatoid factor, multiple sites: Secondary | ICD-10-CM | POA: Diagnosis not present

## 2016-11-28 ENCOUNTER — Other Ambulatory Visit: Payer: Self-pay | Admitting: Internal Medicine

## 2016-11-30 DIAGNOSIS — S7002XA Contusion of left hip, initial encounter: Secondary | ICD-10-CM | POA: Diagnosis not present

## 2016-11-30 DIAGNOSIS — Z7901 Long term (current) use of anticoagulants: Secondary | ICD-10-CM | POA: Diagnosis not present

## 2016-12-04 ENCOUNTER — Other Ambulatory Visit: Payer: Self-pay | Admitting: Internal Medicine

## 2016-12-04 DIAGNOSIS — M792 Neuralgia and neuritis, unspecified: Secondary | ICD-10-CM

## 2016-12-06 DIAGNOSIS — F329 Major depressive disorder, single episode, unspecified: Secondary | ICD-10-CM | POA: Diagnosis not present

## 2016-12-06 DIAGNOSIS — Z856 Personal history of leukemia: Secondary | ICD-10-CM | POA: Diagnosis not present

## 2016-12-06 DIAGNOSIS — Z21 Asymptomatic human immunodeficiency virus [HIV] infection status: Secondary | ICD-10-CM | POA: Diagnosis not present

## 2016-12-06 DIAGNOSIS — I6529 Occlusion and stenosis of unspecified carotid artery: Secondary | ICD-10-CM | POA: Diagnosis not present

## 2016-12-06 DIAGNOSIS — Z79899 Other long term (current) drug therapy: Secondary | ICD-10-CM | POA: Diagnosis not present

## 2016-12-06 DIAGNOSIS — I252 Old myocardial infarction: Secondary | ICD-10-CM | POA: Diagnosis not present

## 2016-12-06 DIAGNOSIS — Z86718 Personal history of other venous thrombosis and embolism: Secondary | ICD-10-CM | POA: Diagnosis not present

## 2016-12-06 DIAGNOSIS — Z951 Presence of aortocoronary bypass graft: Secondary | ICD-10-CM | POA: Diagnosis not present

## 2016-12-06 DIAGNOSIS — D539 Nutritional anemia, unspecified: Secondary | ICD-10-CM | POA: Diagnosis not present

## 2016-12-06 DIAGNOSIS — I2510 Atherosclerotic heart disease of native coronary artery without angina pectoris: Secondary | ICD-10-CM | POA: Diagnosis not present

## 2016-12-06 DIAGNOSIS — S7002XA Contusion of left hip, initial encounter: Secondary | ICD-10-CM | POA: Diagnosis not present

## 2016-12-06 DIAGNOSIS — G629 Polyneuropathy, unspecified: Secondary | ICD-10-CM | POA: Diagnosis not present

## 2016-12-06 DIAGNOSIS — M109 Gout, unspecified: Secondary | ICD-10-CM | POA: Diagnosis not present

## 2016-12-06 DIAGNOSIS — E663 Overweight: Secondary | ICD-10-CM | POA: Diagnosis not present

## 2016-12-06 DIAGNOSIS — R599 Enlarged lymph nodes, unspecified: Secondary | ICD-10-CM | POA: Diagnosis not present

## 2016-12-06 DIAGNOSIS — R591 Generalized enlarged lymph nodes: Secondary | ICD-10-CM | POA: Diagnosis not present

## 2016-12-06 DIAGNOSIS — B20 Human immunodeficiency virus [HIV] disease: Secondary | ICD-10-CM | POA: Diagnosis not present

## 2016-12-06 DIAGNOSIS — Z8673 Personal history of transient ischemic attack (TIA), and cerebral infarction without residual deficits: Secondary | ICD-10-CM | POA: Diagnosis not present

## 2016-12-06 DIAGNOSIS — D509 Iron deficiency anemia, unspecified: Secondary | ICD-10-CM | POA: Diagnosis not present

## 2016-12-06 DIAGNOSIS — Z6827 Body mass index (BMI) 27.0-27.9, adult: Secondary | ICD-10-CM | POA: Diagnosis not present

## 2016-12-06 DIAGNOSIS — Z7982 Long term (current) use of aspirin: Secondary | ICD-10-CM | POA: Diagnosis not present

## 2016-12-06 DIAGNOSIS — S301XXA Contusion of abdominal wall, initial encounter: Secondary | ICD-10-CM | POA: Diagnosis not present

## 2016-12-07 DIAGNOSIS — B20 Human immunodeficiency virus [HIV] disease: Secondary | ICD-10-CM | POA: Diagnosis not present

## 2016-12-07 DIAGNOSIS — D539 Nutritional anemia, unspecified: Secondary | ICD-10-CM | POA: Diagnosis not present

## 2016-12-07 DIAGNOSIS — I2510 Atherosclerotic heart disease of native coronary artery without angina pectoris: Secondary | ICD-10-CM | POA: Diagnosis not present

## 2016-12-07 DIAGNOSIS — R599 Enlarged lymph nodes, unspecified: Secondary | ICD-10-CM | POA: Diagnosis not present

## 2016-12-07 DIAGNOSIS — S7002XA Contusion of left hip, initial encounter: Secondary | ICD-10-CM | POA: Diagnosis not present

## 2016-12-08 DIAGNOSIS — R599 Enlarged lymph nodes, unspecified: Secondary | ICD-10-CM | POA: Diagnosis not present

## 2016-12-08 DIAGNOSIS — D539 Nutritional anemia, unspecified: Secondary | ICD-10-CM | POA: Diagnosis not present

## 2016-12-08 DIAGNOSIS — I2510 Atherosclerotic heart disease of native coronary artery without angina pectoris: Secondary | ICD-10-CM | POA: Diagnosis not present

## 2016-12-08 DIAGNOSIS — B20 Human immunodeficiency virus [HIV] disease: Secondary | ICD-10-CM | POA: Diagnosis not present

## 2016-12-08 DIAGNOSIS — S7002XA Contusion of left hip, initial encounter: Secondary | ICD-10-CM | POA: Diagnosis not present

## 2016-12-09 DIAGNOSIS — R599 Enlarged lymph nodes, unspecified: Secondary | ICD-10-CM | POA: Diagnosis not present

## 2016-12-09 DIAGNOSIS — S7002XA Contusion of left hip, initial encounter: Secondary | ICD-10-CM | POA: Diagnosis not present

## 2016-12-09 DIAGNOSIS — B20 Human immunodeficiency virus [HIV] disease: Secondary | ICD-10-CM | POA: Diagnosis not present

## 2016-12-09 DIAGNOSIS — I2510 Atherosclerotic heart disease of native coronary artery without angina pectoris: Secondary | ICD-10-CM | POA: Diagnosis not present

## 2016-12-09 DIAGNOSIS — D539 Nutritional anemia, unspecified: Secondary | ICD-10-CM | POA: Diagnosis not present

## 2016-12-10 DIAGNOSIS — D539 Nutritional anemia, unspecified: Secondary | ICD-10-CM | POA: Diagnosis not present

## 2016-12-10 DIAGNOSIS — I2510 Atherosclerotic heart disease of native coronary artery without angina pectoris: Secondary | ICD-10-CM | POA: Diagnosis not present

## 2016-12-10 DIAGNOSIS — R599 Enlarged lymph nodes, unspecified: Secondary | ICD-10-CM | POA: Diagnosis not present

## 2016-12-10 DIAGNOSIS — M7981 Nontraumatic hematoma of soft tissue: Secondary | ICD-10-CM | POA: Diagnosis not present

## 2016-12-10 DIAGNOSIS — B20 Human immunodeficiency virus [HIV] disease: Secondary | ICD-10-CM | POA: Diagnosis not present

## 2016-12-10 DIAGNOSIS — K449 Diaphragmatic hernia without obstruction or gangrene: Secondary | ICD-10-CM | POA: Diagnosis not present

## 2016-12-10 DIAGNOSIS — S7002XA Contusion of left hip, initial encounter: Secondary | ICD-10-CM | POA: Diagnosis not present

## 2016-12-10 DIAGNOSIS — K5730 Diverticulosis of large intestine without perforation or abscess without bleeding: Secondary | ICD-10-CM | POA: Diagnosis not present

## 2016-12-15 ENCOUNTER — Inpatient Hospital Stay: Payer: Self-pay | Admitting: Internal Medicine

## 2016-12-18 ENCOUNTER — Ambulatory Visit: Payer: Self-pay

## 2016-12-18 ENCOUNTER — Ambulatory Visit (INDEPENDENT_AMBULATORY_CARE_PROVIDER_SITE_OTHER): Payer: Medicare Other | Admitting: Sports Medicine

## 2016-12-18 ENCOUNTER — Encounter: Payer: Self-pay | Admitting: Sports Medicine

## 2016-12-18 VITALS — BP 122/62 | HR 60 | Ht 67.0 in | Wt 203.0 lb

## 2016-12-18 DIAGNOSIS — M0609 Rheumatoid arthritis without rheumatoid factor, multiple sites: Secondary | ICD-10-CM | POA: Diagnosis not present

## 2016-12-18 DIAGNOSIS — I6529 Occlusion and stenosis of unspecified carotid artery: Secondary | ICD-10-CM | POA: Diagnosis not present

## 2016-12-18 DIAGNOSIS — M25552 Pain in left hip: Secondary | ICD-10-CM

## 2016-12-18 DIAGNOSIS — S7002XA Contusion of left hip, initial encounter: Secondary | ICD-10-CM

## 2016-12-18 DIAGNOSIS — M459 Ankylosing spondylitis of unspecified sites in spine: Secondary | ICD-10-CM

## 2016-12-18 MED ORDER — NITROGLYCERIN 0.2 MG/HR TD PT24
MEDICATED_PATCH | TRANSDERMAL | 1 refills | Status: DC
Start: 2016-12-18 — End: 2018-01-14

## 2016-12-18 NOTE — Patient Instructions (Signed)

## 2016-12-18 NOTE — Progress Notes (Signed)
OFFICE VISIT NOTE [name]. Aval[place] [name], [name] at [facility]  [name] - 66 y.o. male MRN [id number]  Date of birth: 1950-06-21  Visit Date: 12/18/2016  PCP: Christopher Lima, MD   Referred by: Christopher Lima, MD  [name], CMA acting as scribe for Dr. Paulla [name].  SUBJECTIVE:   Chief Complaint  Patient presents with  . Follow-up    left hip injury   HPI: As below and per problem based documentation when appropriate.  [name] is a new patient who has seen Dr. Paulla [name] before at [facility]. He presents today with complaint of left hip pain.  Pain started about 1 week ago when a blood vessel burst. He was on vacation so he couldn't come in any sooner. He feels a pocket of fluid on his left hip. He was seen in [place] and told he was a hematoma. He spent 4 days in the hospital is [facility]. He reports that this is the 3rd time this has happened and it has happened twice in the past 2 weeks.   The pain is described as heavy, pressure and is rated as 3/10.  Worsened with sitting, palpation, walking long distances Improves with staying off of the left side Therapies tried include : icing the hip.  Other associated symptoms include: bruising radiates from the hip down the leg and into the groin    Review of Systems  Constitutional: Negative for chills and fever.  Respiratory: Negative for shortness of breath and wheezing.   Cardiovascular: Negative for chest pain and palpitations.  Musculoskeletal: Negative for falls.  Neurological: Positive for tingling (neuropathy, feet and legs). Negative for dizziness and headaches.  Endo/Heme/Allergies: Bruises/bleeds easily.    Otherwise per HPI.  HISTORY & PERTINENT PRIOR DATA:  No specialty comments available. He reports that he has never smoked. He has never used smokeless tobacco. No results for input(s): HGBA1C, LABURIC in the last 8760 hours. Medications &  Allergies reviewed per EMR Patient Active Problem List   Diagnosis Date Noted  . 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 Deficiency anemia 12/25/2016  . Hearing loss of left ear 12/25/2016  . Traumatic hematoma of hip, left, initial encounter 12/18/2016  . Carotid artery disease ([place]) 11/13/2016  . Stenosis of carotid artery 11/13/2016  . Spinal stenosis of thoracolumbar region 07/02/2016  . Incidental lung nodule, > 51mm and < 53mm 05/14/2016  . Chronic low back pain with sciatica 05/13/2016  . ILD (interstitial lung disease) ([place]) 09/13/2015  . Immunocompromised state ([place]) 09/13/2015  . Tinea cruris 09/12/2015  . Decubitus ulcer 07/11/2015  . PCP (pneumocystis jiroveci pneumonia) ([place]) 06/18/2015  . Postinflammatory pulmonary fibrosis ([place]) 05/11/2015  . Secondary syphilis of skin 07/24/2014  . Fatigue 07/13/2014  . Insomnia 11/11/2013  . GERD (gastroesophageal reflux disease)   . Hereditary and idiopathic peripheral neuropathy 09/08/2013  . Erosive esophagitis 12/28/2012  . Depression 11/16/2012  . Allergic rhinitis, cause unspecified 04/28/2012  . Long term current use of anticoagulant therapy 02/03/2012  . Hypertension   . DVT, lower extremity, recurrent ([place])   . Osteoarthritis, knee   . Gout   . Chronic back pain   . Carotid artery occlusion   . Hyperlipidemia with target LDL less than 100   . HIV infection ([place]) 04/08/2011  . Arthritis, rheumatoid ([place]) 04/08/2011  . Chronic lymphoblastic leukemia ([place]) 04/08/2011  . Impotence of organic origin 04/02/2011  . CAD (coronary artery disease) of  artery bypass graft 02/14/2009  . Coronary artery disease 09/26/2008  . Hyperbilirubinemia 06/26/2008  . Pure hypercholesterolemia 06/26/2008  . Chronic lymphocytic leukemia ([place]) 06/22/2008  . Herpes genitalis 06/22/2008   Past Medical History:  Diagnosis Date  . Carotid artery occlusion    40-60% right ICA stenosis (09/2008)  . Chronic back pain   . CLL (chronic  lymphoblastic leukemia) dx 2010   [facility], no current therapy   . Coronary artery disease 2010   s/p CABG '10, sees Dr. Percival Spanish  . Depression   . Diverticulosis   . DVT, lower extremity, recurrent ([place]) 2008, 2009   LLE, chronic anticoag since 2009  . Esophagitis   . Fibromyalgia   . Gallstones   . GERD (gastroesophageal reflux disease)   . Gout   . Gynecomastia, male   . H/O hiatal hernia 2008   surgery  . Hemorrhoids   . Hepatitis A yrs ago  . HIV infection ([place]) dx 1993  . Hypertension   . Impotence of organic origin   . Myocardial infarction ([place]) 2010    x 2  . Neuromuscular disorder (HCC)    neuropathy  . Osteoarthritis, knee    s/p B TKA  . Pneumonia mrach, may, july 2017  . Rheumatoid arthritis(714.0) dx 2010   MTX, follows with rheum  . Seasonal allergies   . Secondary syphilis 07/24/14 dx   s/p 2 wks doxy  . Status post dilation of esophageal narrowing   . Stroke ([place]) [place]   . TIA (transient ischemic attack) 1997   mild residual L mouth droop  . Tubular adenoma of colon    Family History  Problem Relation Age of Onset  . Breast cancer Mother   . Hypertension Mother   . Hyperlipidemia Mother   . Diabetes Mother   . Prostate cancer Father   . Colon polyps Father   . Hyperlipidemia Father   . Crohn's disease Paternal Aunt   . Diabetes Maternal Grandmother   . Diabetes Brother        x 3  . Heart disease Brother        x 3  . Hyperlipidemia Brother        x 3  . Alcohol abuse Daughter   . Drug abuse Daughter   . Asthma Brother   . Colon cancer Neg Hx    Past Surgical History:  Procedure Laterality Date  . CHOLECYSTECTOMY    . COLONOSCOPY WITH PROPOFOL N/A 12/28/2012   Procedure: COLONOSCOPY WITH PROPOFOL;  Surgeon: Jerene Bears, MD;  [facility];  Service: Gastroenterology;  Laterality: N/A;  . CORONARY ARTERY BYPASS GRAFT  2010   triple bypass  . ESOPHAGOGASTRODUODENOSCOPY (EGD) WITH PROPOFOL N/A 12/28/2012    Procedure: ESOPHAGOGASTRODUODENOSCOPY (EGD) WITH PROPOFOL;  Surgeon: Jerene Bears, MD;  [facility];  Service: Gastroenterology;  Laterality: N/A;  . ESOPHAGOGASTRODUODENOSCOPY (EGD) WITH PROPOFOL N/A 03/15/2013   Procedure: ESOPHAGOGASTRODUODENOSCOPY (EGD) WITH PROPOFOL;  Surgeon: Jerene Bears, MD;  [facility];  Service: Gastroenterology;  Laterality: N/A;  . ESOPHAGOGASTRODUODENOSCOPY (EGD) WITH PROPOFOL N/A 02/07/2016   Procedure: ESOPHAGOGASTRODUODENOSCOPY (EGD) WITH PROPOFOL;  Surgeon: Jerene Bears, MD;  [facility];  Service: Gastroenterology;  Laterality: N/A;  . HARDWARE REMOVAL N/A 07/02/2012   Procedure: HARDWARE REMOVAL;  Surgeon: Elaina Hoops, MD;  Location: [facility];  Service: Neurosurgery;  Laterality: N/A;  . HIATAL HERNIA REPAIR     wrap  . INGUINAL  HERNIA REPAIR Bilateral   . JOINT REPLACEMENT Left 1999  . KNEE ARTHROPLASTY  07/22/2011   Procedure: COMPUTER ASSISTED TOTAL KNEE ARTHROPLASTY;  Surgeon: Meredith Pel, MD;  Location: [facility];  Service: Orthopedics;  Laterality: Right;  Right total knee arthroplasty  . MANDIBLE SURGERY Bilateral    tmj  . REPLACEMENT TOTAL KNEE Bilateral   . ring around testicle hernia reapir  184 and 1986   x 2  . ROTATOR CUFF REPAIR Right   . SHOULDER SURGERY Left   . SPINE SURGERY  2010   "rod and screws", "failed", lopwer spine,   . stent to heart x 1  2010  . TONSILLECTOMY    . UMBILICAL HERNIA REPAIR     x 1  . varicose vein     stripping  . VIDEO BRONCHOSCOPY Bilateral 10/16/2015   Procedure: VIDEO BRONCHOSCOPY WITHOUT FLUORO;  Surgeon: Brand Males, MD;  [facility];  Service: Cardiopulmonary;  Laterality: Bilateral;   Social History   Occupational History  . retired    Social History Main Topics  . Smoking status: Never Smoker  . Smokeless tobacco: Never Used     Comment: occ wine  . Alcohol use Yes     Comment: occasional wine- 1-2 per week  . Drug use: No  . Sexual  activity: No     Comment: pt. given condoms    OBJECTIVE:  VS:  HT:5\' 7"  (170.2 cm)   WT:203 lb (92.1 kg)  BMI:31.9    BP:122/62  HR:60bpm  TEMP: ( )  RESP:94 % EXAM: Findings:  Bilateral lower extremities with significant arthritic change consistent with rheumatoid arthritis.  He has poor internal and external rotation of bilateral hips.  Both knees have been surgically replaced and incisions are well-healed.  He has bruising and ecchymosis as well as a large palpable hematoma along the left posterior buttock.  He has markedly weak hip abductor's.  He does walk with a significantly forward flexed gait and is using a cane at baseline for stability.  No significant lower extremity edema.  There is no significant surrounding erythema of the hematoma.     [place] Limited Joint Space Structures Low Left(no Linked Charges)  Result Date: 12/18/2016 Gerda Diss, DO     01/13/2017  6:30 AM PROCEDURE NOTE -  ULTRASOUND GUIDEDINJECTION: Left Hip traumatic hematoma corticosteroid injection Images were obtained and interpreted by myself, Teresa Coombs, DO Images have been saved and stored to PACS system. Images obtained on: GE S7 Ultrasound machine  ULTRASOUND FINDINGS: Markedly abnormal hip abductor insertion with large hypoechoic change consistent with a evolving hematoma.  Piriformis muscle fibers appear to be disrupted. DESCRIPTION OF PROCEDURE: The patient's clinical condition is marked by substantial pain and/or significant functional disability. Other conservative therapy has not provided relief, is contraindicated, or not appropriate. There is a reasonable likelihood that injection will significantly improve the patient's pain and/or functional impairment. After discussing the risks, benefits and expected outcomes of the injection and all questions were reviewed and answered, the patient wished to undergo the above named procedure. Verbal consent was obtained. The ultrasound was used to identify the  target structure and adjacent neurovascular structures. The skin was then prepped in sterile fashion and the target structure was injected under direct visualization using sterile technique as below: PREP: Alcohol, Ethel Chloride APPROACH: direct inplane, single injection, 25g 1.5" needle INJECTATE:  2cc 1% lidocaine, 1 cc 40mg  DepoMedrol ASPIRATE: N/A DRESSING: Band-Aid Post procedural instructions including recommending icing and  warning signs for infection were reviewed. This procedure was well tolerated and there were no complications.  IMPRESSION: Succesful US Guided Injection    ASSESSMENT & PLAN:     ICD-10-CM   1. Left hip pain M25.552 [place] LIMITED JOINT SPACE STRUCTURES LOW LEFT(NO LINKED CHARGES)  2. Rheumatoid arthritis involving vertebra with positive rheumatoid factor (HCC) M45.9   3. Traumatic hematoma of hip, left, initial encounter S70.02XA   ================================================================= Traumatic hematoma of hip, left, initial encounter I suspect this is actually coming from a piriformis rupture given his significant antalgic gait at baseline and the continue of stress that he has had for quite some time over the piriformis.  We discussed multiple options today including injection and nitroglycerin therapy and he would like to proceed actually with both.  Ultrasound-guided injection performed per procedure.  He will begin with nitroglycerin protocol in 1-2 weeks.  Unfortunately is not a candidate to discontinue anticoagulation but the hematoma should improve with corticosteroid injection.  Discussed the risks of potential worsening with  possibility of  infection and the patient reports being such as the pain that is interested in spite of the risks.  We will plan to follow-up with him in short-term.  >50% of this 35 minute visit spent in direct patient counseling and/or coordination of care.  Discussion was focused on education regarding the in discussing the  pathoetiology and anticipated clinical course of the above condition. =================================================================  Follow-up: Return in about 3 weeks (around 01/08/2017).   CMA/ATC served as Education administrator during this visit. History, Physical, and Plan performed by medical provider. Documentation and orders reviewed and attested to.      Teresa Coombs, [place] [facility] Physician

## 2016-12-25 ENCOUNTER — Encounter: Payer: Self-pay | Admitting: Internal Medicine

## 2016-12-25 ENCOUNTER — Ambulatory Visit (INDEPENDENT_AMBULATORY_CARE_PROVIDER_SITE_OTHER): Payer: Medicare Other | Admitting: Internal Medicine

## 2016-12-25 ENCOUNTER — Other Ambulatory Visit (INDEPENDENT_AMBULATORY_CARE_PROVIDER_SITE_OTHER): Payer: Medicare Other

## 2016-12-25 ENCOUNTER — Ambulatory Visit (HOSPITAL_COMMUNITY)
Admission: RE | Admit: 2016-12-25 | Discharge: 2016-12-25 | Disposition: A | Discharge status: Home / Self Care | Payer: Medicare Other | Source: Ambulatory Visit | Attending: Cardiology | Admitting: Cardiology

## 2016-12-25 VITALS — BP 112/80 | HR 65 | Temp 97.7°F | Resp 16 | Ht 67.0 in | Wt 201.0 lb

## 2016-12-25 DIAGNOSIS — I779 Disorder of arteries and arterioles, unspecified: Secondary | ICD-10-CM

## 2016-12-25 DIAGNOSIS — I10 Essential (primary) hypertension: Secondary | ICD-10-CM

## 2016-12-25 DIAGNOSIS — I6529 Occlusion and stenosis of unspecified carotid artery: Secondary | ICD-10-CM

## 2016-12-25 DIAGNOSIS — R5383 Other fatigue: Secondary | ICD-10-CM

## 2016-12-25 DIAGNOSIS — H9192 Unspecified hearing loss, left ear: Secondary | ICD-10-CM | POA: Diagnosis not present

## 2016-12-25 DIAGNOSIS — D539 Nutritional anemia, unspecified: Secondary | ICD-10-CM | POA: Diagnosis not present

## 2016-12-25 DIAGNOSIS — A5139 Other secondary syphilis of skin: Secondary | ICD-10-CM

## 2016-12-25 DIAGNOSIS — I739 Peripheral vascular disease, unspecified: Secondary | ICD-10-CM

## 2016-12-25 DIAGNOSIS — I6523 Occlusion and stenosis of bilateral carotid arteries: Secondary | ICD-10-CM | POA: Insufficient documentation

## 2016-12-25 LAB — CBC WITH DIFFERENTIAL/PLATELET
Basophils Absolute: 0.1 10*3/uL (ref 0.0–0.1)
Basophils Relative: 0.7 % (ref 0.0–3.0)
Eosinophils Absolute: 0.1 10*3/uL (ref 0.0–0.7)
Eosinophils Relative: 1.4 % (ref 0.0–5.0)
HCT: 40.3 % (ref 39.0–52.0)
Hemoglobin: 12.5 g/dL — ABNORMAL LOW (ref 13.0–17.0)
Lymphocytes Relative: 60.9 % — ABNORMAL HIGH (ref 12.0–46.0)
Lymphs Abs: 6.2 10*3/uL — ABNORMAL HIGH (ref 0.7–4.0)
MCHC: 30.9 g/dL (ref 30.0–36.0)
MCV: 84.3 fl (ref 78.0–100.0)
Monocytes Absolute: 0.6 10*3/uL (ref 0.1–1.0)
Monocytes Relative: 5.8 % (ref 3.0–12.0)
Neutro Abs: 3.2 10*3/uL (ref 1.4–7.7)
Neutrophils Relative %: 31.2 % — ABNORMAL LOW (ref 43.0–77.0)
Platelets: 160 10*3/uL (ref 150.0–400.0)
RBC: 4.79 Mil/uL (ref 4.22–5.81)
RDW: 30.1 % — ABNORMAL HIGH (ref 11.5–15.5)
WBC: 10.1 10*3/uL (ref 4.0–10.5)

## 2016-12-25 LAB — COMPREHENSIVE METABOLIC PANEL
ALT: 18 U/L (ref 0–53)
AST: 22 U/L (ref 0–37)
Albumin: 3.9 g/dL (ref 3.5–5.2)
Alkaline Phosphatase: 85 U/L (ref 39–117)
BUN: 19 mg/dL (ref 6–23)
CO2: 30 mEq/L (ref 19–32)
Calcium: 8.7 mg/dL (ref 8.4–10.5)
Chloride: 105 mEq/L (ref 96–112)
Creatinine, Ser: 0.96 mg/dL (ref 0.40–1.50)
GFR: 82.64 mL/min (ref >60.00)
Glucose, Bld: 87 mg/dL (ref 70–99)
Potassium: 4.1 mEq/L (ref 3.5–5.1)
Sodium: 140 mEq/L (ref 135–145)
Total Bilirubin: 0.9 mg/dL (ref 0.2–1.2)
Total Protein: 5.6 g/dL — ABNORMAL LOW (ref 6.0–8.3)

## 2016-12-25 LAB — IBC PANEL
Iron: 61 ug/dL (ref 42–165)
Saturation Ratios: 15.6 % — ABNORMAL LOW (ref 20.0–50.0)
Transferrin: 280 mg/dL (ref 212.0–360.0)

## 2016-12-25 LAB — FOLATE: Folate: 7.9 ng/mL (ref >5.9)

## 2016-12-25 LAB — FERRITIN: Ferritin: 56.8 ng/mL (ref 22.0–322.0)

## 2016-12-25 NOTE — Patient Instructions (Signed)
Anemia, Nonspecific Anemia is a condition in which the concentration of red blood cells or hemoglobin in the blood is below normal. Hemoglobin is a substance in red blood cells that carries oxygen to the tissues of the body. Anemia results in not enough oxygen reaching these tissues. What are the causes? Common causes of anemia include:  Excessive bleeding. Bleeding may be internal or external. This includes excessive bleeding from periods (in women) or from the intestine.  Poor nutrition.  Chronic kidney, thyroid, and liver disease.  Bone marrow disorders that decrease red blood cell production.  Cancer and treatments for cancer.  HIV, AIDS, and their treatments.  Spleen problems that increase red blood cell destruction.  Blood disorders.  Excess destruction of red blood cells due to infection, medicines, and autoimmune disorders. What are the signs or symptoms?  Minor weakness.  Dizziness.  Headache.  Palpitations.  Shortness of breath, especially with exercise.  Paleness.  Cold sensitivity.  Indigestion.  Nausea.  Difficulty sleeping.  Difficulty concentrating. Symptoms may occur suddenly or they may develop slowly. How is this diagnosed? Additional blood tests are often needed. These help your health care provider determine the best treatment. Your health care provider will check your stool for blood and look for other causes of blood loss. How is this treated? Treatment varies depending on the cause of the anemia. Treatment can include:  Supplements of iron, vitamin B12, or folic acid.  Hormone medicines.  A blood transfusion. This may be needed if blood loss is severe.  Hospitalization. This may be needed if there is significant continual blood loss.  Dietary changes.  Spleen removal. Follow these instructions at home: Keep all follow-up appointments. It often takes many weeks to correct anemia, and having your health care provider check on your  condition and your response to treatment is very important. Get help right away if:  You develop extreme weakness, shortness of breath, or chest pain.  You become dizzy or have trouble concentrating.  You develop heavy vaginal bleeding.  You develop a rash.  You have bloody or black, tarry stools.  You faint.  You vomit up blood.  You vomit repeatedly.  You have abdominal pain.  You have a fever or persistent symptoms for more than 2-3 days.  You have a fever and your symptoms suddenly get worse.  You are dehydrated. This information is not intended to replace advice given to you by your health care provider. Make sure you discuss any questions you have with your health care provider. Document Released: 06/05/2004 Document Revised: 10/10/2015 Document Reviewed: 10/22/2012 Elsevier Interactive Patient Education  2017 Elsevier Inc.  

## 2016-12-25 NOTE — Progress Notes (Signed)
Subjective:  Patient ID: [name], male    DOB: 1950-09-30  Age: 66 y.o. MRN: [id number]  CC: Anemia   HPI [name] presents for f/up after a recent 4 day admission in [place] for non-traumatic/spontaneous hematoma over his left hip/thigh area that started during the drive from [place] to [place]. The associated anemia was so severe and symptomatic that it required transfusion and iron replacement therapy. He complains of fatigue and diffuse muscle weakness.  Outpatient Medications Prior to Visit  Medication Sig Dispense Refill  . acetaminophen (TYLENOL) 325 MG tablet Take 2 tablets (650 mg total) by mouth every 6 (six) hours as needed for mild pain (or Fever >/= 101). 30 tablet 0  . atorvastatin (LIPITOR) 10 MG tablet TAKE 1 TABLET (10 MG TOTAL) BY MOUTH AT BEDTIME. 90 tablet 2  . Certolizumab Pegol (CIMZIA [place]) Inject into the skin. Pt unsure of dose. Every four weeks.    . ciclopirox (LOPROX) 0.77 % SUSP Apply 1 Act topically 2 (two) times daily. 60 mL 5  . clindamycin (CLEOCIN) 150 MG capsule TAKE 4 CAPSULES BY MOUTH ONE FOUR BEFORE APPOINTMENT  2  . edoxaban (SAVAYSA) 30 MG TABS tablet Take 1 tablet (30 mg total) by mouth daily. 90 tablet 1  . elvitegravir-cobicistat-emtricitabine-tenofovir (GENVOYA) 150-150-200-10 MG TABS tablet Take 1 tablet by mouth daily with breakfast. 30 tablet 5  . escitalopram (LEXAPRO) 10 MG tablet Take 1 tablet (10 mg total) by mouth daily. 90 tablet 3  . furosemide (LASIX) 20 MG tablet TAKE 1 TABLET (20 MG TOTAL) BY MOUTH 2 (TWO) TIMES DAILY. 60 tablet 5  . HYDROmorphone (DILAUDID) 2 MG tablet Take 1 tablet (2 mg total) by mouth every 6 (six) hours as needed for severe pain. 100 tablet 0  . loratadine (CLARITIN) 10 MG tablet TAKE 1 TABLET (10 MG TOTAL) BY MOUTH DAILY. 90 tablet 3  . LYRICA 200 MG capsule TAKE 1 CAPSULE BY MOUTH TWICE A DAY 60 capsule 4  . Menthol (ICY HOT) 5 % PTCH Apply 1 patch topically daily. Apply to right upper arm QAM and remove QHS  x10 days    . methocarbamol (ROBAXIN) 500 MG tablet Take 1 tablet (500 mg total) by mouth every 8 (eight) hours as needed for muscle spasms. 75 tablet 4  . metoprolol succinate (TOPROL-XL) 25 MG 24 hr tablet Take 1 tablet (25 mg total) by mouth daily. Need appointment before anymore refills 90 tablet 0  . mometasone (NASONEX) 50 MCG/ACT nasal spray Place 2 sprays into the nose daily as needed (for nasal congestion.).    [name] nitroGLYCERIN (NITRODUR - DOSED IN MG/24 HR) 0.2 mg/hr patch Place 1/4 to 1/2 of a patch over affected region. Remove and replace once daily.  Slightly alter skin placement daily 30 patch 1  . ondansetron (ZOFRAN ODT) 4 MG disintegrating tablet Take 1 tablet (4 mg total) by mouth every 8 (eight) hours as needed for nausea or vomiting. 20 tablet 0  . pantoprazole (PROTONIX) 40 MG tablet Take 1 tablet (40 mg total) by mouth 2 (two) times daily before a meal. 60 tablet 3  . polyethylene glycol (MIRALAX / GLYCOLAX) packet Take 17 g by mouth daily as needed for mild constipation. 14 each 0  . predniSONE (DELTASONE) 5 MG tablet Take 5 mg by mouth daily.  1  . PREZISTA 800 MG tablet TAKE 1 TABLET (800 MG TOTAL) BY MOUTH DAILY. 30 tablet 5  . sulfamethoxazole-trimethoprim (BACTRIM DS,SEPTRA DS) 800-160 MG tablet TAKE  1 TABLET BY MOUTH EVERY 12 (TWELVE) HOURS. 60 tablet 3  . valACYclovir (VALTREX) 500 MG tablet TAKE 1 TABLET BY MOUTH EVERY DAY 90 tablet 3   No facility-administered medications prior to visit.     ROS Review of Systems  Constitutional: Positive for fatigue. Negative for appetite change, chills, diaphoresis and unexpected weight change.  HENT: Positive for hearing loss and tinnitus. Negative for trouble swallowing.   Eyes: Negative.   Respiratory: Negative.  Negative for cough, chest tightness, shortness of breath and wheezing.   Cardiovascular: Negative for chest pain, palpitations and leg swelling.  Gastrointestinal: Negative for abdominal pain, blood in stool,  constipation, diarrhea, nausea and vomiting.  Endocrine: Negative.   Genitourinary: Negative.  Negative for decreased urine volume, difficulty urinating, hematuria and urgency.  Musculoskeletal: Negative for arthralgias, back pain, myalgias and neck pain.  Skin: Negative for color change and rash.  Allergic/Immunologic: Negative.   Neurological: Positive for weakness. Negative for dizziness, syncope and numbness.  Hematological: Negative.  Negative for adenopathy. Does not bruise/bleed easily.  Psychiatric/Behavioral: Negative.     Objective:  BP 112/80 (BP Location: Left Arm, Patient Position: Sitting, Cuff Size: Normal)   Pulse 65   Temp 97.7 F (36.5 C) (Oral)   Resp 16   Ht 5\' 7"  (1.702 m)   Wt 201 lb (91.2 kg)   SpO2 97%   BMI 31.48 kg/m   BP Readings from Last 3 Encounters:  12/25/16 112/80  12/18/16 122/62  11/13/16 122/64    Wt Readings from Last 3 Encounters:  12/25/16 201 lb (91.2 kg)  12/18/16 203 lb (92.1 kg)  11/13/16 203 lb (92.1 kg)    Physical Exam  Constitutional: He is oriented to person, place, and time. No distress.  HENT:  Right Ear: Hearing, tympanic membrane, external ear and ear canal normal.  Left Ear: Hearing, tympanic membrane, external ear and ear canal normal.  Mouth/Throat: Oropharynx is clear and moist. No oropharyngeal exudate.  Eyes: Conjunctivae are normal. Right eye exhibits no discharge. Left eye exhibits no discharge. No scleral icterus.  Neck: Normal range of motion. Neck supple. No JVD present. No thyromegaly present.  Cardiovascular: Normal rate, regular rhythm and intact distal pulses.  Exam reveals no gallop and no friction rub.   No murmur heard. Pulmonary/Chest: Effort normal and breath sounds normal. No respiratory distress. He has no wheezes. He has no rales. He exhibits no tenderness.  Abdominal: Soft. Bowel sounds are normal. He exhibits no distension and no mass. There is no tenderness. There is no rebound and no guarding.   Musculoskeletal: Normal range of motion. He exhibits no edema, tenderness or deformity.       Legs: Lymphadenopathy:    He has no cervical adenopathy.  Neurological: He is alert and oriented to person, place, and time.  Skin: Skin is warm and dry. No rash noted. He is not diaphoretic. No erythema. No pallor.  Vitals reviewed.   Lab Results  Component Value Date   WBC 10.1 12/25/2016   HGB 12.5 (L) 12/25/2016   HCT 40.3 12/25/2016   PLT 160.0 12/25/2016   GLUCOSE 87 12/25/2016   CHOL 130 08/14/2016   TRIG 149 08/14/2016   HDL 39 (L) 08/14/2016   LDLCALC 61 08/14/2016   ALT 18 12/25/2016   AST 22 12/25/2016   NA 140 12/25/2016   K 4.1 12/25/2016   CL 105 12/25/2016   CREATININE 0.96 12/25/2016   BUN 19 12/25/2016   CO2 30 12/25/2016   TSH  1.76 12/25/2016   INR 0.97 06/25/2016   HGBA1C 5.2 12/18/2015    No results found.  Assessment & Plan:   Theophilus was seen today for anemia.  Diagnoses and all orders for this visit:  Essential hypertension- his BP is well controlled, lytes and renal fxn are normal -     Comprehensive metabolic panel; Future -     Thyroid Panel With TSH; Future  Secondary syphilis of skin- his RPR remains neg, no evidence of re-exposure or recurrence -     RPR; Future  Deficiency anemia- his H/H have improved, will monitor for vit deficiencies -     CBC with Differential/Platelet; Future -     IBC panel; Future -     Folate; Future -     Ferritin; Future -     Vitamin B1; Future  Fatigue, unspecified type- his labs do not explain his sx's, this is likely a sequela of recent anemia and admission -     Thyroid Panel With TSH; Future  Hearing loss of left ear, unspecified hearing loss type -     Ambulatory referral to Audiology   I am having Mr. Joos maintain his atorvastatin, ciclopirox, pantoprazole, Certolizumab Pegol (CIMZIA [place]), loratadine, valACYclovir, escitalopram, polyethylene glycol, acetaminophen, ondansetron, methocarbamol,  HYDROmorphone, edoxaban, mometasone, elvitegravir-cobicistat-emtricitabine-tenofovir, PREZISTA, Menthol, metoprolol succinate, furosemide, sulfamethoxazole-trimethoprim, clindamycin, predniSONE, LYRICA, and nitroGLYCERIN.  No orders of the defined types were placed in this encounter.    Follow-up: Return in about 6 weeks (around 02/05/2017).  [name], MD

## 2016-12-26 LAB — THYROID PANEL WITH TSH
Free Thyroxine Index: 2.1 (ref 1.4–3.8)
T3 Uptake: 33 % (ref 22–35)
T4, Total: 6.5 ug/dL (ref 4.9–10.5)
TSH: 1.76 mIU/L (ref 0.40–4.50)

## 2016-12-26 LAB — RPR

## 2016-12-29 ENCOUNTER — Other Ambulatory Visit: Payer: Self-pay | Admitting: Internal Medicine

## 2016-12-29 ENCOUNTER — Encounter: Payer: Self-pay | Admitting: Internal Medicine

## 2016-12-29 DIAGNOSIS — E5111 Dry beriberi: Secondary | ICD-10-CM | POA: Insufficient documentation

## 2016-12-29 LAB — VITAMIN B1: Vitamin B1 (Thiamine): 7 nmol/L — ABNORMAL LOW (ref 8–30)

## 2016-12-29 MED ORDER — VITAMIN B-1 100 MG PO TABS: 100.0000 mg | ORAL_TABLET | Freq: Every day | ORAL | 3 refills | Status: DC

## 2016-12-31 ENCOUNTER — Other Ambulatory Visit: Payer: Self-pay | Admitting: Infectious Diseases

## 2016-12-31 DIAGNOSIS — B20 Human immunodeficiency virus [HIV] disease: Secondary | ICD-10-CM

## 2017-01-02 ENCOUNTER — Other Ambulatory Visit: Payer: Self-pay | Admitting: Internal Medicine

## 2017-01-02 DIAGNOSIS — B356 Tinea cruris: Secondary | ICD-10-CM

## 2017-01-04 ENCOUNTER — Other Ambulatory Visit: Payer: Self-pay | Admitting: Cardiology

## 2017-01-05 NOTE — Telephone Encounter (Signed)
REFILL 

## 2017-01-06 DIAGNOSIS — M5023 Other cervical disc displacement, cervicothoracic region: Secondary | ICD-10-CM | POA: Diagnosis not present

## 2017-01-06 DIAGNOSIS — M4120 Other idiopathic scoliosis, site unspecified: Secondary | ICD-10-CM | POA: Diagnosis not present

## 2017-01-08 ENCOUNTER — Encounter: Payer: Self-pay | Admitting: Sports Medicine

## 2017-01-08 ENCOUNTER — Ambulatory Visit (INDEPENDENT_AMBULATORY_CARE_PROVIDER_SITE_OTHER): Payer: Medicare Other | Admitting: Sports Medicine

## 2017-01-08 VITALS — BP 110/64 | HR 64 | Ht 67.0 in | Wt 202.2 lb

## 2017-01-08 DIAGNOSIS — L84 Corns and callosities: Secondary | ICD-10-CM | POA: Diagnosis not present

## 2017-01-08 DIAGNOSIS — M79672 Pain in left foot: Secondary | ICD-10-CM | POA: Diagnosis not present

## 2017-01-08 DIAGNOSIS — M25552 Pain in left hip: Secondary | ICD-10-CM

## 2017-01-08 DIAGNOSIS — M459 Ankylosing spondylitis of unspecified sites in spine: Secondary | ICD-10-CM | POA: Diagnosis not present

## 2017-01-08 DIAGNOSIS — I6529 Occlusion and stenosis of unspecified carotid artery: Secondary | ICD-10-CM | POA: Diagnosis not present

## 2017-01-08 DIAGNOSIS — E1142 Type 2 diabetes mellitus with diabetic polyneuropathy: Secondary | ICD-10-CM | POA: Diagnosis not present

## 2017-01-08 DIAGNOSIS — S7002XA Contusion of left hip, initial encounter: Secondary | ICD-10-CM | POA: Diagnosis not present

## 2017-01-08 NOTE — Progress Notes (Signed)
OFFICE VISIT NOTE [facility] Bond. [place], [facility]  KEINO PLACENCIA - 66 y.o. male MRN [id number]  Date of birth: May 24, 1950  Visit Date: 01/08/2017  PCP: [name], MD   Referred by: [name], MD  Burlene Arnt, CMA acting as scribe for Dr. Paulla Fore.  SUBJECTIVE:   Chief Complaint  Patient presents with  . Follow-up    left hip pain   HPI: As below and per problem based documentation when appropriate.  Mr. Shell is an established patient presenting today in follow-up of left hip pain/hematoma. He was last seen 12/18/2016 and advised to follow Nitro Protocol.   Pt reports improvement in pain and swelling since started Nitroglycerin patches. He has been using the patches with no side effects but he did stop wearing them about 1 week ago. He says that there is still a little discoloration but it has improved. He no longer has a pulling sensation when going up stairs.     Review of Systems  Constitutional: Negative for chills and fever.  Respiratory: Negative for shortness of breath and wheezing.   Cardiovascular: Negative for chest pain and palpitations.  Musculoskeletal: Negative for falls.  Neurological: Positive for tingling. Negative for dizziness and headaches.  Endo/Heme/Allergies: Does not bruise/bleed easily.    Otherwise per HPI.  HISTORY & PERTINENT PRIOR DATA:  No specialty comments available. He reports that he has never smoked. He has never used smokeless tobacco. No results for input(s): HGBA1C, LABURIC in the last 8760 hours. Medications & Allergies reviewed per EMR Patient Active Problem List   Diagnosis Date Noted  . 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 Deficiency anemia 12/25/2016  . Hearing loss of left ear 12/25/2016  . Traumatic hematoma of hip, left, initial encounter 12/18/2016  . Carotid artery disease ([place]) 11/13/2016  . Stenosis of carotid artery 11/13/2016  . Spinal  stenosis of thoracolumbar region 07/02/2016  . Incidental lung nodule, > 48mm and < 42mm 05/14/2016  . Chronic low back pain with sciatica 05/13/2016  . ILD (interstitial lung disease) ([place]) 09/13/2015  . Immunocompromised state ([place]) 09/13/2015  . Tinea cruris 09/12/2015  . Decubitus ulcer 07/11/2015  . PCP (pneumocystis jiroveci pneumonia) ([place]) 06/18/2015  . Postinflammatory pulmonary fibrosis ([place]) 05/11/2015  . Secondary syphilis of skin 07/24/2014  . Fatigue 07/13/2014  . Insomnia 11/11/2013  . GERD (gastroesophageal reflux disease)   . Hereditary and idiopathic peripheral neuropathy 09/08/2013  . Erosive esophagitis 12/28/2012  . Depression 11/16/2012  . Allergic rhinitis, cause unspecified 04/28/2012  . Long term current use of anticoagulant therapy 02/03/2012  . Hypertension   . DVT, lower extremity, recurrent ([place])   . Osteoarthritis, knee   . Gout   . Chronic back pain   . Carotid artery occlusion   . Hyperlipidemia with target LDL less than 100   . HIV infection ([place]) 04/08/2011  . Arthritis, rheumatoid ([place]) 04/08/2011  . Chronic lymphoblastic leukemia ([place]) 04/08/2011  . Impotence of organic origin 04/02/2011  . CAD (coronary artery disease) of artery bypass graft 02/14/2009  . Coronary artery disease 09/26/2008  . Hyperbilirubinemia 06/26/2008  . Pure hypercholesterolemia 06/26/2008  . Chronic lymphocytic leukemia ([place]) 06/22/2008  . Herpes genitalis 06/22/2008   Past Medical History:  Diagnosis Date  . Carotid artery occlusion    40-60% right ICA stenosis (09/2008)  . Chronic back pain   . CLL (chronic lymphoblastic leukemia) dx 2010   Followed at [facility] q31mo,  no current therapy   . Coronary artery disease 2010   s/p CABG '10, sees Dr. Percival Spanish  . Depression   . Diverticulosis   . DVT, lower extremity, recurrent ([place]) 2008, 2009   LLE, chronic anticoag since 2009  . Esophagitis   . Fibromyalgia   . Gallstones   . GERD (gastroesophageal reflux disease)     . Gout   . Gynecomastia, male   . H/O hiatal hernia 2008   surgery  . Hemorrhoids   . Hepatitis A yrs ago  . HIV infection ([place]) dx 1993  . Hypertension   . Impotence of organic origin   . Myocardial infarction ([place]) 2010    x 2  . Neuromuscular disorder (H[facility])    neuropathy  . Osteoarthritis, knee    s/p B TKA  . Pneumonia mrach, may, july 2017  . Rheumatoid arthritis(714.0) dx 2010   MTX, follows with rheum  . Seasonal allergies   . Secondary syphilis 07/24/14 dx   s/p 2 wks doxy  . Status post dilation of esophageal narrowing   . Stroke ([place]) [place]   . TIA (transient ischemic attack) 1997   mild residual L mouth droop  . Tubular adenoma of colon    Family History  Problem Relation Age of Onset  . Breast cancer Mother   . Hypertension Mother   . Hyperlipidemia Mother   . Diabetes Mother   . Prostate cancer Father   . Colon polyps Father   . Hyperlipidemia Father   . Crohn's disease Paternal Aunt   . Diabetes Maternal Grandmother   . Diabetes Brother        x 3  . Heart disease Brother        x 3  . Hyperlipidemia Brother        x 3  . Alcohol abuse Daughter   . Drug abuse Daughter   . Asthma Brother   . Colon cancer Neg Hx    Past Surgical History:  Procedure Laterality Date  . CHOLECYSTECTOMY    . COLONOSCOPY WITH PROPOFOL N/A 12/28/2012   Procedure: COLONOSCOPY WITH PROPOFOL;  Surgeon: Jerene Bears, MD;  Location: [facility];  Service: Gastroenterology;  Laterality: N/A;  . CORONARY ARTERY BYPASS GRAFT  2010   triple bypass  . ESOPHAGOGASTRODUODENOSCOPY (EGD) WITH PROPOFOL N/A 12/28/2012   Procedure: ESOPHAGOGASTRODUODENOSCOPY (EGD) WITH PROPOFOL;  Surgeon: Jerene Bears, MD;  Location: [facility];  Service: Gastroenterology;  Laterality: N/A;  . ESOPHAGOGASTRODUODENOSCOPY (EGD) WITH PROPOFOL N/A 03/15/2013   Procedure: ESOPHAGOGASTRODUODENOSCOPY (EGD) WITH PROPOFOL;  Surgeon: Jerene Bears, MD;  Location: [facility];  Service: Gastroenterology;   Laterality: N/A;  . ESOPHAGOGASTRODUODENOSCOPY (EGD) WITH PROPOFOL N/A 02/07/2016   Procedure: ESOPHAGOGASTRODUODENOSCOPY (EGD) WITH PROPOFOL;  Surgeon: Jerene Bears, MD;  Location: [facility];  Service: Gastroenterology;  Laterality: N/A;  . HARDWARE REMOVAL N/A 07/02/2012   Procedure: HARDWARE REMOVAL;  Surgeon: Elaina Hoops, MD;  Location: [facility];  Service: Neurosurgery;  Laterality: N/A;  . HIATAL HERNIA REPAIR     wrap  . INGUINAL HERNIA REPAIR Bilateral   . JOINT REPLACEMENT Left 1999  . KNEE ARTHROPLASTY  07/22/2011   Procedure: COMPUTER ASSISTED TOTAL KNEE ARTHROPLASTY;  Surgeon: Meredith Pel, MD;  Location: [facility];  Service: Orthopedics;  Laterality: Right;  Right total knee arthroplasty  . MANDIBLE SURGERY Bilateral    tmj  . REPLACEMENT TOTAL KNEE Bilateral   . ring around testicle hernia reapir  184 and  1986   x 2  . ROTATOR CUFF REPAIR Right   . SHOULDER SURGERY Left   . SPINE SURGERY  2010   "rod and screws", "failed", lopwer spine,   . stent to heart x 1  2010  . TONSILLECTOMY    . UMBILICAL HERNIA REPAIR     x 1  . varicose vein     stripping  . VIDEO BRONCHOSCOPY Bilateral 10/16/2015   Procedure: VIDEO BRONCHOSCOPY WITHOUT FLUORO;  Surgeon: Brand Males, MD;  Location: [facility];  Service: Cardiopulmonary;  Laterality: Bilateral;   Social History   Occupational History  . retired    Social History Main Topics  . Smoking status: Never Smoker  . Smokeless tobacco: Never Used     Comment: occ wine  . Alcohol use Yes     Comment: occasional wine- 1-2 per week  . Drug use: No  . Sexual activity: No     Comment: pt. given condoms    OBJECTIVE:  VS:  HT:5\' 7"  (170.2 cm)   WT:202 lb 3.2 oz (91.7 kg)  BMI:31.66    BP:110/64  HR:64bpm  TEMP: ( )  RESP:93 % EXAM: Findings:  Adult male, walks with a significant the antalgic gait with a almost 90 forward flexed position at the hips using a cane.  Left hip has moderate degree of tenderness over  the greater trochanter and there is poor musculature in this area.  He has 5 out of 5 strength with hip abduction in a seated position with abduction of the hip to 30.  And a 0 abductor position is 3 out of 5 strength.      No results found. ASSESSMENT & PLAN:     ICD-10-CM   1. Left hip pain M25.552   2. Rheumatoid arthritis involving vertebra with positive rheumatoid factor (HCC) M45.9   3. Traumatic hematoma of hip, left, initial encounter S70.02XA   ================================================================= Traumatic hematoma of hip, left, initial encounter Responded well to prior injection.  He discontinue the nitroglycerin protocol due to some minimal vertiginous symptoms.  We will have him continue with home therapeutic exercises including clamshell leg abduction but will otherwise plan to follow-up with him on an as-needed basis if any worsening symptoms can consider repeat injection versus continued nitroglycerin protocol.  Formal physical therapy referral discussed but he would like to defer this at this time. ================================================================= There are no Patient Instructions on file for this visit.=================================================================  Follow-up: Return if symptoms worsen or fail to improve.   CMA/ATC served as Education administrator during this visit. History, Physical, and Plan performed by medical provider. Documentation and orders reviewed and attested to.       Teresa Coombs, [facility] Physician

## 2017-01-08 NOTE — Assessment & Plan Note (Signed)
Responded well to prior injection.  He discontinue the nitroglycerin protocol due to some minimal vertiginous symptoms.  We will have him continue with home therapeutic exercises including clamshell leg abduction but will otherwise plan to follow-up with him on an as-needed basis if any worsening symptoms can consider repeat injection versus continued nitroglycerin protocol.  Formal physical therapy referral discussed but he would like to defer this at this time.

## 2017-01-13 NOTE — Procedures (Signed)
PROCEDURE NOTE -  ULTRASOUND GUIDEDINJECTION: Left Hip traumatic hematoma corticosteroid injection Images were obtained and interpreted by myself, Teresa Coombs, DO  Images have been saved and stored to PACS system. Images obtained on: GE S7 Ultrasound machine  ULTRASOUND FINDINGS: Markedly abnormal hip abductor insertion with large hypoechoic change consistent with a evolving hematoma.  Piriformis muscle fibers appear to be disrupted.   DESCRIPTION OF PROCEDURE:  The patient's clinical condition is marked by substantial pain and/or significant functional disability. Other conservative therapy has not provided relief, is contraindicated, or not appropriate. There is a reasonable likelihood that injection will significantly improve the patient's pain and/or functional impairment. After discussing the risks, benefits and expected outcomes of the injection and all questions were reviewed and answered, the patient wished to undergo the above named procedure. Verbal consent was obtained. The ultrasound was used to identify the target structure and adjacent neurovascular structures. The skin was then prepped in sterile fashion and the target structure was injected under direct visualization using sterile technique as below: PREP: Alcohol, Ethel Chloride APPROACH: direct inplane, single injection, 25g 1.5" needle INJECTATE:  2cc 1% lidocaine, 1 cc 40mg  DepoMedrol ASPIRATE: N/A DRESSING: Band-Aid  Post procedural instructions including recommending icing and warning signs for infection were reviewed. This procedure was well tolerated and there were no complications.   IMPRESSION: Succesful US Guided Injection

## 2017-01-13 NOTE — Assessment & Plan Note (Signed)
I suspect this is actually coming from a piriformis rupture given his significant antalgic gait at baseline and the continue of stress that he has had for quite some time over the piriformis.  We discussed multiple options today including injection and nitroglycerin therapy and he would like to proceed actually with both.  Ultrasound-guided injection performed per procedure.  He will begin with nitroglycerin protocol in 1-2 weeks.  Unfortunately is not a candidate to discontinue anticoagulation but the hematoma should improve with corticosteroid injection.  Discussed the risks of potential worsening with  possibility of  infection and the patient reports being such as the pain that is interested in spite of the risks.  We will plan to follow-up with him in short-term.  >50% of this 35 minute visit spent in direct patient counseling and/or coordination of care.  Discussion was focused on education regarding the in discussing the pathoetiology and anticipated clinical course of the above condition.

## 2017-01-15 DIAGNOSIS — M0609 Rheumatoid arthritis without rheumatoid factor, multiple sites: Secondary | ICD-10-CM | POA: Diagnosis not present

## 2017-01-23 DIAGNOSIS — Z21 Asymptomatic human immunodeficiency virus [HIV] infection status: Secondary | ICD-10-CM | POA: Diagnosis not present

## 2017-01-23 DIAGNOSIS — M1990 Unspecified osteoarthritis, unspecified site: Secondary | ICD-10-CM | POA: Diagnosis not present

## 2017-01-23 DIAGNOSIS — W5509XA Other contact with cat, initial encounter: Secondary | ICD-10-CM | POA: Diagnosis not present

## 2017-01-23 DIAGNOSIS — Z951 Presence of aortocoronary bypass graft: Secondary | ICD-10-CM | POA: Diagnosis not present

## 2017-01-23 DIAGNOSIS — I509 Heart failure, unspecified: Secondary | ICD-10-CM | POA: Diagnosis not present

## 2017-01-23 DIAGNOSIS — Z955 Presence of coronary angioplasty implant and graft: Secondary | ICD-10-CM | POA: Diagnosis not present

## 2017-01-23 DIAGNOSIS — F329 Major depressive disorder, single episode, unspecified: Secondary | ICD-10-CM | POA: Diagnosis not present

## 2017-01-23 DIAGNOSIS — S81811A Laceration without foreign body, right lower leg, initial encounter: Secondary | ICD-10-CM | POA: Diagnosis not present

## 2017-01-26 DIAGNOSIS — Z79899 Other long term (current) drug therapy: Secondary | ICD-10-CM | POA: Diagnosis not present

## 2017-01-26 DIAGNOSIS — Z4801 Encounter for change or removal of surgical wound dressing: Secondary | ICD-10-CM | POA: Diagnosis not present

## 2017-01-26 DIAGNOSIS — M1990 Unspecified osteoarthritis, unspecified site: Secondary | ICD-10-CM | POA: Diagnosis not present

## 2017-01-26 DIAGNOSIS — Z21 Asymptomatic human immunodeficiency virus [HIV] infection status: Secondary | ICD-10-CM | POA: Diagnosis not present

## 2017-01-26 DIAGNOSIS — I509 Heart failure, unspecified: Secondary | ICD-10-CM | POA: Diagnosis not present

## 2017-01-26 DIAGNOSIS — S81811D Laceration without foreign body, right lower leg, subsequent encounter: Secondary | ICD-10-CM | POA: Diagnosis not present

## 2017-01-26 DIAGNOSIS — F329 Major depressive disorder, single episode, unspecified: Secondary | ICD-10-CM | POA: Diagnosis not present

## 2017-02-03 DIAGNOSIS — Z4801 Encounter for change or removal of surgical wound dressing: Secondary | ICD-10-CM | POA: Diagnosis not present

## 2017-02-04 ENCOUNTER — Other Ambulatory Visit: Payer: Self-pay | Admitting: Cardiology

## 2017-02-04 ENCOUNTER — Other Ambulatory Visit: Payer: Self-pay | Admitting: Internal Medicine

## 2017-02-04 NOTE — Telephone Encounter (Signed)
REFILL 

## 2017-02-11 ENCOUNTER — Other Ambulatory Visit: Payer: Self-pay | Admitting: Oncology

## 2017-02-11 DIAGNOSIS — C9110 Chronic lymphocytic leukemia of B-cell type not having achieved remission: Secondary | ICD-10-CM

## 2017-02-12 ENCOUNTER — Telehealth: Payer: Self-pay | Admitting: Oncology

## 2017-02-12 ENCOUNTER — Other Ambulatory Visit (HOSPITAL_BASED_OUTPATIENT_CLINIC_OR_DEPARTMENT_OTHER): Payer: Medicare Other

## 2017-02-12 ENCOUNTER — Ambulatory Visit (HOSPITAL_BASED_OUTPATIENT_CLINIC_OR_DEPARTMENT_OTHER): Payer: Medicare Other | Admitting: Oncology

## 2017-02-12 VITALS — BP 120/61 | HR 75 | Temp 98.7°F | Resp 18 | Ht 67.0 in | Wt 202.2 lb

## 2017-02-12 DIAGNOSIS — C9110 Chronic lymphocytic leukemia of B-cell type not having achieved remission: Secondary | ICD-10-CM

## 2017-02-12 DIAGNOSIS — M0609 Rheumatoid arthritis without rheumatoid factor, multiple sites: Secondary | ICD-10-CM | POA: Diagnosis not present

## 2017-02-12 LAB — CBC WITH DIFFERENTIAL/PLATELET
BASO%: 0.6 % (ref 0.0–2.0)
Basophils Absolute: 0.1 10*3/uL (ref 0.0–0.1)
EOS%: 4.8 % (ref 0.0–7.0)
Eosinophils Absolute: 0.5 10*3/uL (ref 0.0–0.5)
HCT: 39 % (ref 38.4–49.9)
HGB: 12.4 g/dL — ABNORMAL LOW (ref 13.0–17.1)
LYMPH%: 62.3 % — ABNORMAL HIGH (ref 14.0–49.0)
MCH: 27.2 pg (ref 27.2–33.4)
MCHC: 31.8 g/dL — ABNORMAL LOW (ref 32.0–36.0)
MCV: 85.4 fL (ref 79.3–98.0)
MONO#: 0.7 10*3/uL (ref 0.1–0.9)
MONO%: 6.9 % (ref 0.0–14.0)
NEUT#: 2.5 10*3/uL (ref 1.5–6.5)
NEUT%: 25.4 % — ABNORMAL LOW (ref 39.0–75.0)
Platelets: 134 10*3/uL — ABNORMAL LOW (ref 140–400)
RBC: 4.57 10*6/uL (ref 4.20–5.82)
RDW: 21.2 % — ABNORMAL HIGH (ref 11.0–14.6)
WBC: 9.9 10*3/uL (ref 4.0–10.3)
lymph#: 6.1 10*3/uL — ABNORMAL HIGH (ref 0.9–3.3)

## 2017-02-12 LAB — TECHNOLOGIST REVIEW

## 2017-02-12 NOTE — Progress Notes (Signed)
Hematology and Oncology Follow Up Visit  [name] [id number] 05-27-1950 66 y.o. 02/12/2017 10:15 AM [name], MDJones, [name], MD   Principle Diagnosis: 66 year old gentleman with CLL diagnosed in 2007 at [facility].  Current therapy: Observation and surveillance.  Interim History:  [name] presents today for a followup visit. Since his last visit, he reports feeling well without any recent complaints. He continues to enjoy excellent quality of life without any decline. He is able to ambulate without any major difficulties and did not report any falls or syncope. His appetite remains excellent and has gained more weight. He denied any fevers or chills or sweats. Has not reported any recurrent infections. He denied any lymphadenopathy, abdominal distention or early satiety.   He does not report any blurry vision or double vision. Does not report any syncope or seizures. Report any chest pain or shortness of breath. Does not report any cough or hemoptysis. His appetite and weight have been stable. Has not reported any change in his bowel habits such as constipation or diarrhea. He is not reporting any urinary symptoms. Rest of his review of systems unremarkable.  Medications: I have reviewed the patient's current medications.  Current Outpatient Prescriptions  Medication Sig Dispense Refill  . acetaminophen (TYLENOL) 325 MG tablet Take 2 tablets (650 mg total) by mouth every 6 (six) hours as needed for mild pain (or Fever >/= 101). 30 tablet 0  . atorvastatin (LIPITOR) 10 MG tablet TAKE 1 TABLET (10 MG TOTAL) BY MOUTH AT BEDTIME. 90 tablet 3  . Certolizumab Pegol (CIMZIA [place]) Inject into the skin. Pt unsure of dose. Every four weeks.    . ciclopirox (LOPROX) 0.77 % SUSP APPLY 1 ACT TOPICALLY 2 (TWO) TIMES DAILY. 60 mL 3  . clindamycin (CLEOCIN) 150 MG capsule TAKE 4 CAPSULES BY MOUTH ONE FOUR BEFORE APPOINTMENT  2  . edoxaban (SAVAYSA) 30 MG TABS tablet Take 1  tablet (30 mg total) by mouth daily. 90 tablet 1  . elvitegravir-cobicistat-emtricitabine-tenofovir (GENVOYA) 150-150-200-10 MG TABS tablet Take 1 tablet by mouth daily with breakfast. 30 tablet 5  . escitalopram (LEXAPRO) 10 MG tablet Take 1 tablet (10 mg total) by mouth daily. 90 tablet 3  . furosemide (LASIX) 20 MG tablet TAKE 1 TABLET (20 MG TOTAL) BY MOUTH 2 (TWO) TIMES DAILY. 60 tablet 5  . HYDROmorphone (DILAUDID) 2 MG tablet Take 1 tablet (2 mg total) by mouth every 6 (six) hours as needed for severe pain. 100 tablet 0  . loratadine (CLARITIN) 10 MG tablet TAKE 1 TABLET (10 MG TOTAL) BY MOUTH DAILY. 90 tablet 3  . LYRICA 200 MG capsule TAKE 1 CAPSULE BY MOUTH TWICE A DAY 60 capsule 4  . Menthol (ICY HOT) 5 % PTCH Apply 1 patch topically daily. Apply to [name] upper arm QAM and remove QHS x10 days    . methocarbamol (ROBAXIN) 500 MG tablet Take 1 tablet (500 mg total) by mouth every 8 (eight) hours as needed for muscle spasms. 75 tablet 4  . metoprolol succinate (TOPROL-XL) 25 MG 24 hr tablet Take 1 tablet (25 mg total) by mouth daily. 90 tablet 3  . mometasone (NASONEX) 50 MCG/ACT nasal spray Place 2 sprays into the nose daily as needed (for nasal congestion.).    [name] nitroGLYCERIN (NITRODUR - DOSED IN MG/24 HR) 0.2 mg/hr patch Place 1/4 to 1/2 of a patch over affected region. Remove and replace once daily.  Slightly alter skin placement daily (Patient not taking:  Reported on 01/08/2017) 30 patch 1  . ondansetron (ZOFRAN ODT) 4 MG disintegrating tablet Take 1 tablet (4 mg total) by mouth every 8 (eight) hours as needed for nausea or vomiting. 20 tablet 0  . pantoprazole (PROTONIX) 40 MG tablet Take 1 tablet (40 mg total) by mouth 2 (two) times daily before a meal. 60 tablet 3  . polyethylene glycol (MIRALAX / GLYCOLAX) packet Take 17 g by mouth daily as needed for mild constipation. 14 each 0  . predniSONE (DELTASONE) 5 MG tablet Take 5 mg by mouth daily.  1  . PREZISTA 800 MG tablet TAKE 1  TABLET (800 MG TOTAL) BY MOUTH DAILY. 30 tablet 5  . sulfamethoxazole-trimethoprim (BACTRIM DS,SEPTRA DS) 800-160 MG tablet TAKE 1 TABLET BY MOUTH EVERY 12 (TWELVE) HOURS. 60 tablet 3  . thiamine (VITAMIN B-1) 100 MG tablet Take 1 tablet (100 mg total) by mouth daily. 90 tablet 3  . valACYclovir (VALTREX) 500 MG tablet TAKE 1 TABLET BY MOUTH EVERY DAY 90 tablet 3   No current facility-administered medications for this visit.      Allergies:  Allergies  Allergen Reactions  . Other Anaphylaxis and Hives    Pecan  . Peanut-Containing Drug Products Anaphylaxis, Hives and Swelling    Swelling of throat  . Morphine Other (See Comments)    REACTION: severe headache  . Oxycodone-Acetaminophen Other (See Comments)    REACTION: headache  . Penicillins Rash and Other (See Comments)    FLUSHED, RED  Has patient had a PCN reaction causing immediate rash, facial/tongue/throat swelling, SOB or lightheadedness with hypotension: #  #  #  YES  #  #  #  Has patient had a PCN reaction causing severe rash involving mucus membranes or skin necrosis: No Has patient had a PCN reaction that required hospitalization No Has patient had a PCN reaction occurring within the last 10 years: No If all of the above answers are "NO", then may proceed with Cephalosporin use  . Promethazine Hcl Other (See Comments)    REACTION: makes him feel drunk    Past Medical History, Surgical history, Social history, and Family History were reviewed and updated.   Physical Exam: Blood pressure 120/61, pulse 75, temperature 98.7 F (37.1 C), temperature source Oral, resp. rate 18, height 5\' 7"  (1.702 m), weight 202 lb 3.2 oz (91.7 kg), SpO2 97 %. ECOG: 1 General appearance: Well-appearing gentleman without distress. Head: Normocephalic, without obvious abnormality no oral ulcers or lesions. Neck: no adenopathy or masses. Lymph nodes: Cervical, supraclavicular, and axillary nodes normal. Heart:regular rate and rhythm, S1,  S2 normal, no murmur, click, rub or gallop Lung:chest clear, no wheezing, rales, normal symmetric air entry.  Abdomin: soft, non-tender, without masses or organomegaly. No shifting dullness or ascites EXT:no erythema, induration, or nodules   Lab Results: Lab Results  Component Value Date   WBC 9.9 02/12/2017   HGB 12.4 (L) 02/12/2017   HCT 39.0 02/12/2017   MCV 85.4 02/12/2017   PLT 134 (L) 02/12/2017     Chemistry      Component Value Date/Time   NA 140 12/25/2016 1056   NA 140 08/13/2016 0919   K 4.1 12/25/2016 1056   K 3.7 08/13/2016 0919   CL 105 12/25/2016 1056   CO2 30 12/25/2016 1056   CO2 27 08/13/2016 0919   BUN 19 12/25/2016 1056   BUN 18.7 08/13/2016 0919   CREATININE 0.96 12/25/2016 1056   CREATININE 1.1 08/13/2016 0919   GLU 92 07/09/2016  Component Value Date/Time   CALCIUM 8.7 12/25/2016 1056   CALCIUM 8.8 08/13/2016 0919   ALKPHOS 85 12/25/2016 1056   ALKPHOS 114 08/13/2016 0919   AST 22 12/25/2016 1056   AST 21 08/13/2016 0919   ALT 18 12/25/2016 1056   ALT 18 08/13/2016 0919   BILITOT 0.9 12/25/2016 1056   BILITOT 0.59 08/13/2016 0919        Impression and Plan:  66 year old gentleman with the following issues:   1. CLL diagnosed in 2007 presented with stage 0 CD38 positive, ZAP 70 positive. He did have some mild lymphadenopathy on CT scan in January 2015 that is mostly subcentimeter with a aortocaval lymph node largest measuring 2.0 x 1.6 cm.   Laboratory data from 02/12/2017 showed normal white cell counts without any lymphocytosis. He is completely asymptomatic at this time and let us continue with active surveillance and repeat laboratory testing and physical exam in 6 months. Imaging studies will be done he develops any symptoms or rise in his white cell count.  2. Chronic back pain: Resolved after his recent operation in February 2018.   3. Follow-up: In 6 months.   Zola Button, MD 10/4/201810:15 AM

## 2017-02-12 NOTE — Telephone Encounter (Signed)
Gave avs and calendar for April 2019 °

## 2017-02-13 DIAGNOSIS — Z23 Encounter for immunization: Secondary | ICD-10-CM | POA: Diagnosis not present

## 2017-02-18 ENCOUNTER — Telehealth: Payer: Self-pay | Admitting: *Deleted

## 2017-02-18 MED ORDER — FUROSEMIDE 20 MG PO TABS: 20.0000 mg | ORAL_TABLET | Freq: Two times a day (BID) | ORAL | 3 refills | Status: DC

## 2017-02-18 NOTE — Telephone Encounter (Signed)
Rx sent Furosemide to [facility]

## 2017-02-21 DIAGNOSIS — R51 Headache: Secondary | ICD-10-CM | POA: Diagnosis not present

## 2017-02-21 DIAGNOSIS — Z888 Allergy status to other drugs, medicaments and biological substances status: Secondary | ICD-10-CM | POA: Diagnosis not present

## 2017-02-21 DIAGNOSIS — I2510 Atherosclerotic heart disease of native coronary artery without angina pectoris: Secondary | ICD-10-CM | POA: Diagnosis not present

## 2017-02-21 DIAGNOSIS — R110 Nausea: Secondary | ICD-10-CM | POA: Diagnosis not present

## 2017-02-21 DIAGNOSIS — Z86718 Personal history of other venous thrombosis and embolism: Secondary | ICD-10-CM | POA: Diagnosis not present

## 2017-02-21 DIAGNOSIS — Z880 Allergy status to penicillin: Secondary | ICD-10-CM | POA: Diagnosis not present

## 2017-02-21 DIAGNOSIS — Z886 Allergy status to analgesic agent status: Secondary | ICD-10-CM | POA: Diagnosis not present

## 2017-02-21 DIAGNOSIS — Z7982 Long term (current) use of aspirin: Secondary | ICD-10-CM | POA: Diagnosis not present

## 2017-02-21 DIAGNOSIS — R55 Syncope and collapse: Secondary | ICD-10-CM | POA: Diagnosis not present

## 2017-02-21 DIAGNOSIS — Z951 Presence of aortocoronary bypass graft: Secondary | ICD-10-CM | POA: Diagnosis not present

## 2017-02-21 DIAGNOSIS — K219 Gastro-esophageal reflux disease without esophagitis: Secondary | ICD-10-CM | POA: Diagnosis not present

## 2017-02-21 DIAGNOSIS — R404 Transient alteration of awareness: Secondary | ICD-10-CM | POA: Diagnosis not present

## 2017-02-21 DIAGNOSIS — J8410 Pulmonary fibrosis, unspecified: Secondary | ICD-10-CM | POA: Diagnosis not present

## 2017-02-21 DIAGNOSIS — Z955 Presence of coronary angioplasty implant and graft: Secondary | ICD-10-CM | POA: Diagnosis not present

## 2017-02-21 DIAGNOSIS — Z21 Asymptomatic human immunodeficiency virus [HIV] infection status: Secondary | ICD-10-CM | POA: Diagnosis not present

## 2017-02-21 DIAGNOSIS — R42 Dizziness and giddiness: Secondary | ICD-10-CM | POA: Diagnosis not present

## 2017-02-21 DIAGNOSIS — Z96653 Presence of artificial knee joint, bilateral: Secondary | ICD-10-CM | POA: Diagnosis not present

## 2017-02-21 DIAGNOSIS — Z9049 Acquired absence of other specified parts of digestive tract: Secondary | ICD-10-CM | POA: Diagnosis not present

## 2017-02-21 DIAGNOSIS — Z7951 Long term (current) use of inhaled steroids: Secondary | ICD-10-CM | POA: Diagnosis not present

## 2017-02-21 DIAGNOSIS — J9811 Atelectasis: Secondary | ICD-10-CM | POA: Diagnosis not present

## 2017-02-21 DIAGNOSIS — I509 Heart failure, unspecified: Secondary | ICD-10-CM | POA: Diagnosis not present

## 2017-02-21 DIAGNOSIS — Z8673 Personal history of transient ischemic attack (TIA), and cerebral infarction without residual deficits: Secondary | ICD-10-CM | POA: Diagnosis not present

## 2017-02-21 DIAGNOSIS — Z79899 Other long term (current) drug therapy: Secondary | ICD-10-CM | POA: Diagnosis not present

## 2017-02-21 DIAGNOSIS — Z7952 Long term (current) use of systemic steroids: Secondary | ICD-10-CM | POA: Diagnosis not present

## 2017-02-21 DIAGNOSIS — M069 Rheumatoid arthritis, unspecified: Secondary | ICD-10-CM | POA: Diagnosis not present

## 2017-02-21 DIAGNOSIS — G629 Polyneuropathy, unspecified: Secondary | ICD-10-CM | POA: Diagnosis not present

## 2017-02-21 DIAGNOSIS — R531 Weakness: Secondary | ICD-10-CM | POA: Diagnosis not present

## 2017-02-22 DIAGNOSIS — R55 Syncope and collapse: Secondary | ICD-10-CM | POA: Diagnosis not present

## 2017-02-26 ENCOUNTER — Encounter: Payer: Self-pay | Admitting: Internal Medicine

## 2017-02-26 ENCOUNTER — Ambulatory Visit (INDEPENDENT_AMBULATORY_CARE_PROVIDER_SITE_OTHER): Payer: Medicare Other | Admitting: Internal Medicine

## 2017-02-26 VITALS — BP 112/60 | HR 60 | Temp 97.8°F | Resp 16 | Ht 67.0 in | Wt 205.5 lb

## 2017-02-26 DIAGNOSIS — I10 Essential (primary) hypertension: Secondary | ICD-10-CM | POA: Diagnosis not present

## 2017-02-26 DIAGNOSIS — B9789 Other viral agents as the cause of diseases classified elsewhere: Secondary | ICD-10-CM | POA: Diagnosis not present

## 2017-02-26 DIAGNOSIS — J069 Acute upper respiratory infection, unspecified: Secondary | ICD-10-CM | POA: Diagnosis not present

## 2017-02-26 DIAGNOSIS — I6529 Occlusion and stenosis of unspecified carotid artery: Secondary | ICD-10-CM

## 2017-02-26 DIAGNOSIS — G609 Hereditary and idiopathic neuropathy, unspecified: Secondary | ICD-10-CM | POA: Diagnosis not present

## 2017-02-26 MED ORDER — PROMETHAZINE-DM 6.25-15 MG/5ML PO SYRP: 5.0000 mL | ORAL_SOLUTION | Freq: Four times a day (QID) | ORAL | 0 refills | Status: DC | PRN

## 2017-02-26 NOTE — Progress Notes (Addendum)
Subjective:  Patient ID: [name], male    DOB: 12-Jan-1951  Age: 66 y.o. MRN: [id number]  CC: URI   HPI [name] presents for concerns about a 3-day history of sore throat, postnasal drip, nasal congestion, nonproductive cough.  [name] denies lymphadenopathy, fever, chills, night sweats.  [name] was recently seen in an outside ED for low blood pressure.  [name] has lowered his dose of Lasix and is feeling much better.  Outpatient Medications Prior to Visit  Medication Sig Dispense Refill  . acetaminophen (TYLENOL) 325 MG tablet Take 2 tablets (650 mg total) by mouth every 6 (six) hours as needed for mild pain (or Fever >/= 101). 30 tablet 0  . atorvastatin (LIPITOR) 10 MG tablet TAKE 1 TABLET (10 MG TOTAL) BY MOUTH AT BEDTIME. 90 tablet 3  . Certolizumab Pegol (CIMZIA [place]) Inject into the skin. Pt unsure of dose. Every four weeks.    . ciclopirox (LOPROX) 0.77 % SUSP APPLY 1 ACT TOPICALLY 2 (TWO) TIMES DAILY. 60 mL 3  . clindamycin (CLEOCIN) 150 MG capsule TAKE 4 CAPSULES BY MOUTH ONE FOUR BEFORE APPOINTMENT  2  . elvitegravir-cobicistat-emtricitabine-tenofovir (GENVOYA) 150-150-200-10 MG TABS tablet Take 1 tablet by mouth daily with breakfast. 30 tablet 5  . escitalopram (LEXAPRO) 10 MG tablet Take 1 tablet (10 mg total) by mouth daily. 90 tablet 3  . furosemide (LASIX) 20 MG tablet Take 1 tablet (20 mg total) by mouth 2 (two) times daily. 90 tablet 3  . HYDROmorphone (DILAUDID) 2 MG tablet Take 1 tablet (2 mg total) by mouth every 6 (six) hours as needed for severe pain. 100 tablet 0  . loratadine (CLARITIN) 10 MG tablet TAKE 1 TABLET (10 MG TOTAL) BY MOUTH DAILY. 90 tablet 3  . LYRICA 200 MG capsule TAKE 1 CAPSULE BY MOUTH TWICE A DAY 60 capsule 4  . Menthol (ICY HOT) 5 % PTCH Apply 1 patch topically daily. Apply to right upper arm QAM and remove QHS x10 days    . methocarbamol (ROBAXIN) 500 MG tablet Take 1 tablet (500 mg total) by mouth every 8 (eight) hours as needed for muscle spasms.  75 tablet 4  . metoprolol succinate (TOPROL-XL) 25 MG 24 hr tablet Take 1 tablet (25 mg total) by mouth daily. 90 tablet 3  . mometasone (NASONEX) 50 MCG/ACT nasal spray Place 2 sprays into the nose daily as needed (for nasal congestion.).    [name] nitroGLYCERIN (NITRODUR - DOSED IN MG/24 HR) 0.2 mg/hr patch Place 1/4 to 1/2 of a patch over affected region. Remove and replace once daily.  Slightly alter skin placement daily 30 patch 1  . pantoprazole (PROTONIX) 40 MG tablet Take 1 tablet (40 mg total) by mouth 2 (two) times daily before a meal. 60 tablet 3  . polyethylene glycol (MIRALAX / GLYCOLAX) packet Take 17 g by mouth daily as needed for mild constipation. 14 each 0  . predniSONE (DELTASONE) 5 MG tablet Take 5 mg by mouth daily.  1  . PREZISTA 800 MG tablet TAKE 1 TABLET (800 MG TOTAL) BY MOUTH DAILY. 30 tablet 5  . sulfamethoxazole-trimethoprim (BACTRIM DS,SEPTRA DS) 800-160 MG tablet TAKE 1 TABLET BY MOUTH EVERY 12 (TWELVE) HOURS. 60 tablet 3  . thiamine (VITAMIN B-1) 100 MG tablet Take 1 tablet (100 mg total) by mouth daily. 90 tablet 3  . valACYclovir (VALTREX) 500 MG tablet TAKE 1 TABLET BY MOUTH EVERY DAY 90 tablet 3  . edoxaban (SAVAYSA) 30 MG TABS tablet Take  1 tablet (30 mg total) by mouth daily. 90 tablet 1  . ondansetron (ZOFRAN ODT) 4 MG disintegrating tablet Take 1 tablet (4 mg total) by mouth every 8 (eight) hours as needed for nausea or vomiting. 20 tablet 0   No facility-administered medications prior to visit.     ROS Review of Systems  Constitutional: Negative for chills, fatigue and fever.  HENT: Positive for congestion, postnasal drip, rhinorrhea and sore throat. Negative for facial swelling, sinus pain, sinus pressure, sneezing, tinnitus and trouble swallowing.   Eyes: Negative.   Respiratory: Negative.  Negative for cough, chest tightness, shortness of breath and wheezing.   Cardiovascular: Negative for chest pain, palpitations and leg swelling.  Gastrointestinal:  Negative.  Negative for abdominal pain, constipation, diarrhea, nausea and vomiting.  Endocrine: Negative.   Genitourinary: Negative.   Musculoskeletal: Negative.  Negative for back pain, myalgias and neck pain.  Skin: Negative.  Negative for color change and rash.  Allergic/Immunologic: Negative.   Neurological: Negative.  Negative for dizziness, weakness, light-headedness and headaches.  Hematological: Negative for adenopathy. Does not bruise/bleed easily.  Psychiatric/Behavioral: Negative.     Objective:  BP 112/60 (BP Location: Left Arm, Patient Position: Sitting, Cuff Size: Normal)   Pulse 60   Temp 97.8 F (36.6 C) (Oral)   Resp 16   Ht 5\' 7"  (1.702 m)   Wt 205 lb 8 oz (93.2 kg)   SpO2 95%   BMI 32.19 kg/m   BP Readings from Last 3 Encounters:  02/26/17 112/60  02/12/17 120/61  01/08/17 110/64    Wt Readings from Last 3 Encounters:  02/26/17 205 lb 8 oz (93.2 kg)  02/12/17 202 lb 3.2 oz (91.7 kg)  01/08/17 202 lb 3.2 oz (91.7 kg)    Physical Exam  Constitutional: [name] is oriented to person, place, and time.  Non-toxic appearance. [name] does not have a sickly appearance. [name] does not appear ill. No distress.  HENT:  Nose: No mucosal edema, rhinorrhea or sinus tenderness. Right sinus exhibits no maxillary sinus tenderness and no frontal sinus tenderness. Left sinus exhibits no maxillary sinus tenderness and no frontal sinus tenderness.  Mouth/Throat: Oropharynx is clear and moist and mucous membranes are normal. Mucous membranes are not pale, not dry and not cyanotic. No oral lesions. No trismus in the jaw. No uvula swelling. No oropharyngeal exudate, posterior oropharyngeal edema, posterior oropharyngeal erythema or tonsillar abscesses.  Eyes: Conjunctivae are normal. Right eye exhibits no discharge. Left eye exhibits no discharge. No scleral icterus.  Neck: Normal range of motion. Neck supple. No JVD present. No thyromegaly present.  Cardiovascular: Normal rate, regular  rhythm and intact distal pulses.  Exam reveals no gallop and no friction rub.   No murmur heard. Pulmonary/Chest: Effort normal and breath sounds normal. No respiratory distress. [name] has no wheezes. [name] has no rales. [name] exhibits no tenderness.  Abdominal: Soft. Bowel sounds are normal. [name] exhibits no distension and no mass. There is no tenderness. There is no rebound and no guarding.  Musculoskeletal: Normal range of motion. [name] exhibits no edema, tenderness or deformity.  Presence of diabetes complications include peripheral neuropathy, peripheral vascular disease and cardiovascular disease. Current ulcer located on 5th toe of left foot. Notable changes in the foot since the last evaluation that include blood and discharge on socks.  Patient is having problems with his shoes.   Lymphadenopathy:    [name] has no cervical adenopathy.  Neurological: [name] is alert and oriented to person, place, and time.  Skin:  Skin is warm and dry. No rash noted. [name] is not diaphoretic. No erythema. No pallor.  Vitals reviewed.   Lab Results  Component Value Date   WBC 9.9 02/12/2017   HGB 12.4 (L) 02/12/2017   HCT 39.0 02/12/2017   PLT 134 (L) 02/12/2017   GLUCOSE 87 12/25/2016   CHOL 130 08/14/2016   TRIG 149 08/14/2016   HDL 39 (L) 08/14/2016   LDLCALC 61 08/14/2016   ALT 18 12/25/2016   AST 22 12/25/2016   NA 140 12/25/2016   K 4.1 12/25/2016   CL 105 12/25/2016   CREATININE 0.96 12/25/2016   BUN 19 12/25/2016   CO2 30 12/25/2016   TSH 1.76 12/25/2016   INR 0.97 06/25/2016   HGBA1C 5.2 12/18/2015    No results found.  Assessment & Plan:   Morris was seen today for uri.  Diagnoses and all orders for this visit:  Viral URI with cough-his signs and symptoms are consistent with a viral URI.  Will offer symptom relief.  If his symptoms do not resolve soon then [name] will let me know and will consider starting an antibacterial agent. -     promethazine-dextromethorphan (PROMETHAZINE-DM) 6.25-15 MG/5ML  syrup; Take 5 mLs by mouth 4 (four) times daily as needed for cough.  Essential hypertension-his blood pressure is well controlled.  [name] has had no more symptoms related to hypotension.  We will continue the current meds.   I have discontinued [name]'s ondansetron. I am also having him start on promethazine-dextromethorphan. Additionally, I am having him maintain his pantoprazole, Certolizumab Pegol (CIMZIA [place]), loratadine, valACYclovir, escitalopram, polyethylene glycol, acetaminophen, methocarbamol, HYDROmorphone, mometasone, elvitegravir-cobicistat-emtricitabine-tenofovir, Menthol, sulfamethoxazole-trimethoprim, clindamycin, predniSONE, LYRICA, nitroGLYCERIN, thiamine, PREZISTA, ciclopirox, metoprolol succinate, atorvastatin, and furosemide.  Meds ordered this encounter  Medications  . promethazine-dextromethorphan (PROMETHAZINE-DM) 6.25-15 MG/5ML syrup    Sig: Take 5 mLs by mouth 4 (four) times daily as needed for cough.    Dispense:  118 mL    Refill:  0     Follow-up: Return if symptoms worsen or fail to improve.  [name], MD

## 2017-02-26 NOTE — Patient Instructions (Signed)
Upper Respiratory Infection, Adult Most upper respiratory infections (URIs) are caused by a virus. A URI affects the nose, throat, and upper air passages. The most common type of URI is often called "the common cold." Follow these instructions at home:  Take medicines only as told by your doctor.  Gargle warm saltwater or take cough drops to comfort your throat as told by your doctor.  Use a warm mist humidifier or inhale steam from a shower to increase air moisture. This may make it easier to breathe.  Drink enough fluid to keep your pee (urine) clear or pale yellow.  Eat soups and other clear broths.  Have a healthy diet.  Rest as needed.  Go back to work when your fever is gone or your doctor says it is okay. ? You may need to stay home longer to avoid giving your URI to others. ? You can also wear a face mask and wash your hands often to prevent spread of the virus.  Use your inhaler more if you have asthma.  Do not use any tobacco products, including cigarettes, chewing tobacco, or electronic cigarettes. If you need help quitting, ask your doctor. Contact a doctor if:  You are getting worse, not better.  Your symptoms are not helped by medicine.  You have chills.  You are getting more short of breath.  You have brown or red mucus.  You have yellow or brown discharge from your nose.  You have pain in your face, especially when you bend forward.  You have a fever.  You have puffy (swollen) neck glands.  You have pain while swallowing.  You have white areas in the back of your throat. Get help right away if:  You have very bad or constant: ? Headache. ? Ear pain. ? Pain in your forehead, behind your eyes, and over your cheekbones (sinus pain). ? Chest pain.  You have long-lasting (chronic) lung disease and any of the following: ? Wheezing. ? Long-lasting cough. ? Coughing up blood. ? A change in your usual mucus.  You have a stiff neck.  You have  changes in your: ? Vision. ? Hearing. ? Thinking. ? Mood. This information is not intended to replace advice given to you by your health care provider. Make sure you discuss any questions you have with your health care provider. Document Released: 10/15/2007 Document Revised: 12/30/2015 Document Reviewed: 08/03/2013 Elsevier Interactive Patient Education  2018 Elsevier Inc.  

## 2017-02-28 ENCOUNTER — Other Ambulatory Visit: Payer: Self-pay | Admitting: Internal Medicine

## 2017-02-28 DIAGNOSIS — I82403 Acute embolism and thrombosis of unspecified deep veins of lower extremity, bilateral: Secondary | ICD-10-CM

## 2017-03-03 ENCOUNTER — Other Ambulatory Visit: Payer: Self-pay | Admitting: Internal Medicine

## 2017-03-03 ENCOUNTER — Encounter: Payer: Self-pay | Admitting: Internal Medicine

## 2017-03-03 DIAGNOSIS — J0100 Acute maxillary sinusitis, unspecified: Secondary | ICD-10-CM | POA: Insufficient documentation

## 2017-03-03 MED ORDER — CEFDINIR 300 MG PO CAPS: 300.0000 mg | ORAL_CAPSULE | Freq: Two times a day (BID) | ORAL | 0 refills | Status: AC

## 2017-03-07 ENCOUNTER — Other Ambulatory Visit: Payer: Self-pay | Admitting: Internal Medicine

## 2017-03-09 ENCOUNTER — Other Ambulatory Visit: Payer: Self-pay | Admitting: *Deleted

## 2017-03-09 DIAGNOSIS — B20 Human immunodeficiency virus [HIV] disease: Secondary | ICD-10-CM

## 2017-03-09 MED ORDER — ELVITEG-COBIC-EMTRICIT-TENOFAF 150-150-200-10 MG PO TABS: 1.0000 | ORAL_TABLET | Freq: Every day | ORAL | 1 refills | Status: DC

## 2017-03-09 NOTE — Addendum Note (Signed)
Addended by: Aviva Signs M on: 03/09/2017 10:04 AM   Modules accepted: Orders

## 2017-03-10 ENCOUNTER — Other Ambulatory Visit: Payer: Self-pay | Admitting: Internal Medicine

## 2017-03-10 DIAGNOSIS — B59 Pneumocystosis: Secondary | ICD-10-CM

## 2017-03-12 DIAGNOSIS — M0609 Rheumatoid arthritis without rheumatoid factor, multiple sites: Secondary | ICD-10-CM | POA: Diagnosis not present

## 2017-03-17 ENCOUNTER — Telehealth: Payer: Self-pay

## 2017-03-17 DIAGNOSIS — E1142 Type 2 diabetes mellitus with diabetic polyneuropathy: Secondary | ICD-10-CM

## 2017-03-17 NOTE — Telephone Encounter (Signed)
[phone] with [employer].   [place] with VP and she gave the fax number to send information to.   Form faxed to number.

## 2017-03-17 NOTE — Telephone Encounter (Signed)
Left patient a detailed message to let me know the name of the company that is providing the DM shoes.   I have the paper work done and I am ready to send to them.

## 2017-03-19 DIAGNOSIS — B351 Tinea unguium: Secondary | ICD-10-CM | POA: Diagnosis not present

## 2017-03-19 DIAGNOSIS — E1142 Type 2 diabetes mellitus with diabetic polyneuropathy: Secondary | ICD-10-CM | POA: Diagnosis not present

## 2017-03-19 DIAGNOSIS — M79676 Pain in unspecified toe(s): Secondary | ICD-10-CM | POA: Diagnosis not present

## 2017-03-19 DIAGNOSIS — L84 Corns and callosities: Secondary | ICD-10-CM | POA: Diagnosis not present

## 2017-03-20 DIAGNOSIS — H2512 Age-related nuclear cataract, left eye: Secondary | ICD-10-CM | POA: Diagnosis not present

## 2017-03-20 DIAGNOSIS — H2513 Age-related nuclear cataract, bilateral: Secondary | ICD-10-CM | POA: Diagnosis not present

## 2017-03-20 DIAGNOSIS — H31092 Other chorioretinal scars, left eye: Secondary | ICD-10-CM | POA: Diagnosis not present

## 2017-03-20 DIAGNOSIS — H40013 Open angle with borderline findings, low risk, bilateral: Secondary | ICD-10-CM | POA: Diagnosis not present

## 2017-03-23 ENCOUNTER — Other Ambulatory Visit: Payer: Self-pay | Admitting: Internal Medicine

## 2017-03-23 DIAGNOSIS — F3341 Major depressive disorder, recurrent, in partial remission: Secondary | ICD-10-CM

## 2017-03-30 DIAGNOSIS — H2512 Age-related nuclear cataract, left eye: Secondary | ICD-10-CM | POA: Diagnosis not present

## 2017-03-30 DIAGNOSIS — H2511 Age-related nuclear cataract, right eye: Secondary | ICD-10-CM | POA: Diagnosis not present

## 2017-03-31 DIAGNOSIS — H2511 Age-related nuclear cataract, right eye: Secondary | ICD-10-CM | POA: Diagnosis not present

## 2017-04-06 DIAGNOSIS — H2511 Age-related nuclear cataract, right eye: Secondary | ICD-10-CM | POA: Diagnosis not present

## 2017-04-07 DIAGNOSIS — Z6829 Body mass index (BMI) 29.0-29.9, adult: Secondary | ICD-10-CM | POA: Diagnosis not present

## 2017-04-07 DIAGNOSIS — M4120 Other idiopathic scoliosis, site unspecified: Secondary | ICD-10-CM | POA: Diagnosis not present

## 2017-04-09 DIAGNOSIS — M0609 Rheumatoid arthritis without rheumatoid factor, multiple sites: Secondary | ICD-10-CM | POA: Diagnosis not present

## 2017-04-28 ENCOUNTER — Other Ambulatory Visit: Payer: Self-pay

## 2017-04-28 MED ORDER — ATORVASTATIN CALCIUM 10 MG PO TABS: 10.0000 mg | ORAL_TABLET | Freq: Every day | ORAL | 3 refills | Status: DC

## 2017-05-07 DIAGNOSIS — M0609 Rheumatoid arthritis without rheumatoid factor, multiple sites: Secondary | ICD-10-CM | POA: Diagnosis not present

## 2017-05-11 ENCOUNTER — Other Ambulatory Visit: Payer: Self-pay | Admitting: Internal Medicine

## 2017-05-11 DIAGNOSIS — M792 Neuralgia and neuritis, unspecified: Secondary | ICD-10-CM

## 2017-05-13 ENCOUNTER — Other Ambulatory Visit: Payer: Self-pay | Admitting: Internal Medicine

## 2017-05-18 ENCOUNTER — Other Ambulatory Visit: Payer: Self-pay | Admitting: Internal Medicine

## 2017-05-18 DIAGNOSIS — M792 Neuralgia and neuritis, unspecified: Secondary | ICD-10-CM

## 2017-05-18 DIAGNOSIS — J069 Acute upper respiratory infection, unspecified: Secondary | ICD-10-CM

## 2017-05-18 DIAGNOSIS — B9789 Other viral agents as the cause of diseases classified elsewhere: Secondary | ICD-10-CM

## 2017-05-18 DIAGNOSIS — J0100 Acute maxillary sinusitis, unspecified: Secondary | ICD-10-CM

## 2017-05-19 ENCOUNTER — Encounter: Payer: Self-pay | Admitting: Internal Medicine

## 2017-05-19 ENCOUNTER — Other Ambulatory Visit: Payer: Self-pay | Admitting: Internal Medicine

## 2017-05-19 DIAGNOSIS — M792 Neuralgia and neuritis, unspecified: Secondary | ICD-10-CM

## 2017-05-19 MED ORDER — PREGABALIN 200 MG PO CAPS: 200.0000 mg | ORAL_CAPSULE | Freq: Two times a day (BID) | ORAL | 1 refills | Status: DC

## 2017-05-21 DIAGNOSIS — E1142 Type 2 diabetes mellitus with diabetic polyneuropathy: Secondary | ICD-10-CM | POA: Diagnosis not present

## 2017-05-21 DIAGNOSIS — M79673 Pain in unspecified foot: Secondary | ICD-10-CM | POA: Diagnosis not present

## 2017-05-21 DIAGNOSIS — B351 Tinea unguium: Secondary | ICD-10-CM | POA: Diagnosis not present

## 2017-05-21 DIAGNOSIS — L84 Corns and callosities: Secondary | ICD-10-CM | POA: Diagnosis not present

## 2017-06-04 DIAGNOSIS — M0609 Rheumatoid arthritis without rheumatoid factor, multiple sites: Secondary | ICD-10-CM | POA: Diagnosis not present

## 2017-06-12 DIAGNOSIS — Z21 Asymptomatic human immunodeficiency virus [HIV] infection status: Secondary | ICD-10-CM | POA: Diagnosis not present

## 2017-06-12 DIAGNOSIS — J849 Interstitial pulmonary disease, unspecified: Secondary | ICD-10-CM | POA: Diagnosis not present

## 2017-06-12 DIAGNOSIS — M1A09X0 Idiopathic chronic gout, multiple sites, without tophus (tophi): Secondary | ICD-10-CM | POA: Diagnosis not present

## 2017-06-12 DIAGNOSIS — M150 Primary generalized (osteo)arthritis: Secondary | ICD-10-CM | POA: Diagnosis not present

## 2017-06-12 DIAGNOSIS — C9110 Chronic lymphocytic leukemia of B-cell type not having achieved remission: Secondary | ICD-10-CM | POA: Diagnosis not present

## 2017-06-12 DIAGNOSIS — B59 Pneumocystosis: Secondary | ICD-10-CM | POA: Diagnosis not present

## 2017-06-12 DIAGNOSIS — Z6831 Body mass index (BMI) 31.0-31.9, adult: Secondary | ICD-10-CM | POA: Diagnosis not present

## 2017-06-12 DIAGNOSIS — M0609 Rheumatoid arthritis without rheumatoid factor, multiple sites: Secondary | ICD-10-CM | POA: Diagnosis not present

## 2017-06-12 DIAGNOSIS — E669 Obesity, unspecified: Secondary | ICD-10-CM | POA: Diagnosis not present

## 2017-06-12 DIAGNOSIS — M2550 Pain in unspecified joint: Secondary | ICD-10-CM | POA: Diagnosis not present

## 2017-07-02 DIAGNOSIS — Z79899 Other long term (current) drug therapy: Secondary | ICD-10-CM | POA: Diagnosis not present

## 2017-07-02 DIAGNOSIS — M2550 Pain in unspecified joint: Secondary | ICD-10-CM | POA: Diagnosis not present

## 2017-07-02 DIAGNOSIS — M0609 Rheumatoid arthritis without rheumatoid factor, multiple sites: Secondary | ICD-10-CM | POA: Diagnosis not present

## 2017-07-05 ENCOUNTER — Other Ambulatory Visit: Payer: Self-pay | Admitting: Internal Medicine

## 2017-07-05 DIAGNOSIS — B20 Human immunodeficiency virus [HIV] disease: Secondary | ICD-10-CM

## 2017-07-06 ENCOUNTER — Other Ambulatory Visit: Payer: Self-pay | Admitting: *Deleted

## 2017-07-06 DIAGNOSIS — B20 Human immunodeficiency virus [HIV] disease: Secondary | ICD-10-CM

## 2017-07-06 MED ORDER — DARUNAVIR ETHANOLATE 800 MG PO TABS: ORAL_TABLET | ORAL | 3 refills | Status: DC

## 2017-07-08 ENCOUNTER — Other Ambulatory Visit: Payer: Self-pay | Admitting: *Deleted

## 2017-07-08 DIAGNOSIS — B20 Human immunodeficiency virus [HIV] disease: Secondary | ICD-10-CM

## 2017-07-09 ENCOUNTER — Other Ambulatory Visit: Payer: Medicare Other

## 2017-07-09 DIAGNOSIS — B20 Human immunodeficiency virus [HIV] disease: Secondary | ICD-10-CM

## 2017-07-09 DIAGNOSIS — C9110 Chronic lymphocytic leukemia of B-cell type not having achieved remission: Secondary | ICD-10-CM

## 2017-07-09 LAB — COMPLETE METABOLIC PANEL WITH GFR
AG Ratio: 2.9 (calc) — ABNORMAL HIGH (ref 1.0–2.5)
ALT: 21 U/L (ref 9–46)
AST: 27 U/L (ref 10–35)
Albumin: 4.4 g/dL (ref 3.6–5.1)
Alkaline phosphatase (APISO): 79 U/L (ref 40–115)
BUN: 19 mg/dL (ref 7–25)
CO2: 28 mmol/L (ref 20–32)
Calcium: 9.2 mg/dL (ref 8.6–10.3)
Chloride: 104 mmol/L (ref 98–110)
Creat: 1.12 mg/dL (ref 0.70–1.25)
GFR, Est African American: 77 mL/min/{1.73_m2} (ref >=60)
GFR, Est Non African American: 67 mL/min/{1.73_m2} (ref >=60)
Globulin: 1.5 g/dL (calc) — ABNORMAL LOW (ref 1.9–3.7)
Glucose, Bld: 77 mg/dL (ref 65–99)
Potassium: 3.8 mmol/L (ref 3.5–5.3)
Sodium: 140 mmol/L (ref 135–146)
Total Bilirubin: 0.7 mg/dL (ref 0.2–1.2)
Total Protein: 5.9 g/dL — ABNORMAL LOW (ref 6.1–8.1)

## 2017-07-09 LAB — CBC WITH DIFFERENTIAL/PLATELET
Basophils Absolute: 55 cells/uL (ref 0–200)
Basophils Relative: 0.4 %
Eosinophils Absolute: 124 cells/uL (ref 15–500)
Eosinophils Relative: 0.9 %
HCT: 41.6 % (ref 38.5–50.0)
Hemoglobin: 13.6 g/dL (ref 13.2–17.1)
Lymphs Abs: 9356 cells/uL — ABNORMAL HIGH (ref 850–3900)
MCH: 27.5 pg (ref 27.0–33.0)
MCHC: 32.7 g/dL (ref 32.0–36.0)
MCV: 84.2 fL (ref 80.0–100.0)
MPV: 9.2 fL (ref 7.5–12.5)
Monocytes Relative: 15.6 %
Neutro Abs: 2111 cells/uL (ref 1500–7800)
Neutrophils Relative %: 15.3 %
Platelets: 144 10*3/uL (ref 140–400)
RBC: 4.94 10*6/uL (ref 4.20–5.80)
RDW: 14.7 % (ref 11.0–15.0)
Total Lymphocyte: 67.8 %
WBC mixed population: 2153 cells/uL — ABNORMAL HIGH (ref 200–950)
WBC: 13.8 10*3/uL — ABNORMAL HIGH (ref 3.8–10.8)

## 2017-07-10 LAB — T-HELPER CELL (CD4) - (RCID CLINIC ONLY)
CD4 % Helper T Cell: 18 % — ABNORMAL LOW (ref 33–55)
CD4 T Cell Abs: 1790 /uL (ref 400–2700)

## 2017-07-11 LAB — HIV-1 RNA QUANT-NO REFLEX-BLD
HIV 1 RNA Quant: <20 copies/mL
HIV-1 RNA Quant, Log: <1.3 Log copies/mL

## 2017-07-13 ENCOUNTER — Other Ambulatory Visit: Payer: Self-pay | Admitting: Internal Medicine

## 2017-07-13 DIAGNOSIS — B59 Pneumocystosis: Secondary | ICD-10-CM

## 2017-07-22 ENCOUNTER — Encounter: Payer: Self-pay | Admitting: Infectious Diseases

## 2017-07-22 ENCOUNTER — Ambulatory Visit (INDEPENDENT_AMBULATORY_CARE_PROVIDER_SITE_OTHER): Payer: Medicare Other | Admitting: Infectious Diseases

## 2017-07-22 VITALS — BP 135/80 | HR 62 | Temp 97.8°F | Ht 69.0 in | Wt 204.0 lb

## 2017-07-22 DIAGNOSIS — B20 Human immunodeficiency virus [HIV] disease: Secondary | ICD-10-CM | POA: Diagnosis not present

## 2017-07-22 DIAGNOSIS — N529 Male erectile dysfunction, unspecified: Secondary | ICD-10-CM

## 2017-07-22 DIAGNOSIS — J849 Interstitial pulmonary disease, unspecified: Secondary | ICD-10-CM

## 2017-07-22 DIAGNOSIS — Z79899 Other long term (current) drug therapy: Secondary | ICD-10-CM | POA: Diagnosis not present

## 2017-07-22 DIAGNOSIS — M459 Ankylosing spondylitis of unspecified sites in spine: Secondary | ICD-10-CM

## 2017-07-22 DIAGNOSIS — Z113 Encounter for screening for infections with a predominantly sexual mode of transmission: Secondary | ICD-10-CM

## 2017-07-22 DIAGNOSIS — IMO0001 Reserved for inherently not codable concepts without codable children: Secondary | ICD-10-CM

## 2017-07-22 DIAGNOSIS — C91Z0 Other lymphoid leukemia not having achieved remission: Secondary | ICD-10-CM | POA: Diagnosis not present

## 2017-07-22 DIAGNOSIS — M549 Dorsalgia, unspecified: Secondary | ICD-10-CM

## 2017-07-22 DIAGNOSIS — G8929 Other chronic pain: Secondary | ICD-10-CM

## 2017-07-22 DIAGNOSIS — I10 Essential (primary) hypertension: Secondary | ICD-10-CM | POA: Diagnosis not present

## 2017-07-22 NOTE — Assessment & Plan Note (Signed)
Appreciate Dr Hazeline Junker f/u.  Appears to be stable.

## 2017-07-22 NOTE — Assessment & Plan Note (Signed)
He is doing well Not sexually active.  vax are up to date Aged out of [employer].  Will see him back in 6 months.

## 2017-07-22 NOTE — Progress Notes (Signed)
   Subjective:    Patient ID: [name], male    DOB: [birth year], 67 y.o.   MRN: [id number]  HPI 67 yo M with hx of HIV+, CLL (dx 2007), pulmonary fibrosis, and RA for which he takes MTX.  He also has a hx of esophageal strictures dilated by EGD every 1.5 yrs.  2-202-18 he underwent lumbar laminectomy. No problems. "Dr Saintclair Halsted did an outstanding job. I praise the ground he walks on".  Breathing has been a bit worse due to weight gain. DOE+. On O2 at night.  Sees Onc yearly (last 02-2017)- told that he is doing well, continue surveillance.  He is currently on genvoya/darunavir.   Feels like he is really slowing down, decreased libido.  Was started on B12 injections.  Trying to lose wt. watching diet.   HIV 1 RNA Quant (copies/mL)  Date Value  07/09/2017 <20 NOT DETECTED  08/14/2016 <20 DETECTED (A)  02/18/2016 31 (H)   CD4 T Cell Abs (/uL)  Date Value  07/09/2017 1,790  08/14/2016 790  02/18/2016 1,140    Review of Systems  Constitutional: Positive for fatigue. Negative for appetite change, chills, fever and unexpected weight change.  Gastrointestinal: Negative for constipation and diarrhea.  Genitourinary: Negative for difficulty urinating.  Psychiatric/Behavioral: Positive for dysphoric mood. Negative for sleep disturbance.  feels like he is out in "outer space". Unable to focus.  Please see HPI. All other systems reviewed and negative.      Objective:   Physical Exam  Constitutional: He appears well-developed and well-nourished.  HENT:  Mouth/Throat: No oropharyngeal exudate.  Eyes: EOM are normal. Pupils are equal, round, and reactive to light.  Neck: Neck supple.  Cardiovascular: Normal rate, regular rhythm and normal heart sounds.  Pulmonary/Chest: Effort normal and breath sounds normal.  Abdominal: Soft. Bowel sounds are normal. There is no tenderness. There is no rebound.  Musculoskeletal: He exhibits no edema.       Arms: Lymphadenopathy:    He has no  cervical adenopathy.  Psychiatric: He has a normal mood and affect.          Assessment & Plan:

## 2017-07-22 NOTE — Assessment & Plan Note (Signed)
Will check his testosterone for this and for his overall fatigue, loss of libido.

## 2017-07-22 NOTE — Assessment & Plan Note (Signed)
Appreciate neurosurgery f/u He is pleased with his result.

## 2017-07-22 NOTE — Assessment & Plan Note (Signed)
Appears to be stable.  Could be cause of his fatigue? will f/u with PCP

## 2017-07-22 NOTE — Assessment & Plan Note (Signed)
Will f/u with his rheum

## 2017-07-22 NOTE — Assessment & Plan Note (Signed)
He is well controlled.  Appreciate PCP f/u.

## 2017-07-23 LAB — TESTOSTERONE: Testosterone: 336 ng/dL (ref 250–827)

## 2017-07-23 LAB — RPR: RPR Ser Ql: NONREACTIVE

## 2017-07-23 LAB — CORTISOL: Cortisol, Plasma: 6.2 ug/dL

## 2017-07-23 LAB — TSH: TSH: 1.44 mIU/L (ref 0.40–4.50)

## 2017-07-28 ENCOUNTER — Encounter: Payer: Self-pay | Admitting: Infectious Diseases

## 2017-07-30 DIAGNOSIS — M0609 Rheumatoid arthritis without rheumatoid factor, multiple sites: Secondary | ICD-10-CM | POA: Diagnosis not present

## 2017-07-30 DIAGNOSIS — B351 Tinea unguium: Secondary | ICD-10-CM | POA: Diagnosis not present

## 2017-07-30 DIAGNOSIS — M79676 Pain in unspecified toe(s): Secondary | ICD-10-CM | POA: Diagnosis not present

## 2017-07-30 DIAGNOSIS — L84 Corns and callosities: Secondary | ICD-10-CM | POA: Diagnosis not present

## 2017-07-30 DIAGNOSIS — E1142 Type 2 diabetes mellitus with diabetic polyneuropathy: Secondary | ICD-10-CM | POA: Diagnosis not present

## 2017-08-12 ENCOUNTER — Other Ambulatory Visit: Payer: Self-pay | Admitting: Internal Medicine

## 2017-08-12 ENCOUNTER — Other Ambulatory Visit: Payer: Self-pay | Admitting: *Deleted

## 2017-08-12 DIAGNOSIS — IMO0001 Reserved for inherently not codable concepts without codable children: Secondary | ICD-10-CM

## 2017-08-12 DIAGNOSIS — I82403 Acute embolism and thrombosis of unspecified deep veins of lower extremity, bilateral: Secondary | ICD-10-CM

## 2017-08-13 ENCOUNTER — Inpatient Hospital Stay: Payer: Medicare Other

## 2017-08-13 ENCOUNTER — Inpatient Hospital Stay: Payer: Medicare Other | Attending: Oncology | Admitting: Oncology

## 2017-08-13 ENCOUNTER — Telehealth: Payer: Self-pay | Admitting: Oncology

## 2017-08-13 VITALS — BP 119/67 | HR 64 | Temp 97.9°F | Resp 20 | Ht 69.0 in | Wt 200.7 lb

## 2017-08-13 DIAGNOSIS — M545 Low back pain: Secondary | ICD-10-CM | POA: Diagnosis not present

## 2017-08-13 DIAGNOSIS — G8929 Other chronic pain: Secondary | ICD-10-CM | POA: Diagnosis not present

## 2017-08-13 DIAGNOSIS — IMO0001 Reserved for inherently not codable concepts without codable children: Secondary | ICD-10-CM

## 2017-08-13 DIAGNOSIS — B20 Human immunodeficiency virus [HIV] disease: Secondary | ICD-10-CM | POA: Insufficient documentation

## 2017-08-13 DIAGNOSIS — C9110 Chronic lymphocytic leukemia of B-cell type not having achieved remission: Secondary | ICD-10-CM | POA: Diagnosis not present

## 2017-08-13 DIAGNOSIS — M4120 Other idiopathic scoliosis, site unspecified: Secondary | ICD-10-CM | POA: Diagnosis not present

## 2017-08-13 LAB — CBC WITH DIFFERENTIAL (CANCER CENTER ONLY)
Basophils Absolute: 0 10*3/uL (ref 0.0–0.1)
Basophils Relative: 0 %
Eosinophils Absolute: 0.1 10*3/uL (ref 0.0–0.5)
Eosinophils Relative: 1 %
HCT: 44.5 % (ref 38.4–49.9)
Hemoglobin: 14.1 g/dL (ref 13.0–17.1)
Lymphocytes Relative: 69 %
Lymphs Abs: 9.1 10*3/uL — ABNORMAL HIGH (ref 0.9–3.3)
MCH: 28.8 pg (ref 27.2–33.4)
MCHC: 31.7 g/dL — ABNORMAL LOW (ref 32.0–36.0)
MCV: 91 fL (ref 79.3–98.0)
Monocytes Absolute: 0.7 10*3/uL (ref 0.1–0.9)
Monocytes Relative: 6 %
Neutro Abs: 3.2 10*3/uL (ref 1.5–6.5)
Neutrophils Relative %: 24 %
Platelet Count: 131 10*3/uL — ABNORMAL LOW (ref 140–400)
RBC: 4.89 MIL/uL (ref 4.20–5.82)
RDW: 17.3 % — ABNORMAL HIGH (ref 11.0–14.6)
WBC Count: 13.3 10*3/uL — ABNORMAL HIGH (ref 4.0–10.3)

## 2017-08-13 NOTE — Telephone Encounter (Signed)
Appointments scheduled AVS/Calendar printed per 4/4 los °

## 2017-08-13 NOTE — Progress Notes (Signed)
Hematology and Oncology Follow Up Visit  [name] [id number] January 03, 1951 67 y.o. 08/13/2017 9:29 AM [name], MDJones, [name], MD   Principle Diagnosis: 67 year old man with stage 0 CLL diagnosed in 2007.  He presented with a lymphocytosis and was diagnosed at [facility].  Current therapy: Active surveillance.  Interim History:  [name] is here for a follow-up.  Since her last visit, he reports no major changes in his health.  He continues to report improvement in his back pain and ambulation.  He denies any falls or tripping.  He does ambulate with the help of a cane.  His appetite is excellent although intentionally trying to lose weight.  He denies any constitutional symptoms or recurrent infections.  His performance status and quality of life remain excellent.    He does not report any headaches, blurry vision or double vision. Does not report any syncope or seizures.  He does not report any fevers, chills or sweats.  He denies any any chest pain or shortness of breath. Does not report any cough or hemoptysis.  He denies any nausea, vomiting or abdominal pain.  He denies any hematochezia or melena.  He is not reporting any urinary symptoms.  He denies any skin rashes or lesions.  No lymphadenopathy or petechiae.  Rest of his review of systems is negative.  Medications: I have reviewed the patient's current medications.  Current Outpatient Medications  Medication Sig Dispense Refill  . acetaminophen (TYLENOL) 325 MG tablet Take 2 tablets (650 mg total) by mouth every 6 (six) hours as needed for mild pain (or Fever >/= 101). 30 tablet 0  . atorvastatin (LIPITOR) 10 MG tablet Take 1 tablet (10 mg total) by mouth at bedtime. 90 tablet 3  . ciclopirox (LOPROX) 0.77 % SUSP APPLY 1 ACT TOPICALLY 2 (TWO) TIMES DAILY. 60 mL 3  . clindamycin (CLEOCIN) 150 MG capsule TAKE 4 CAPSULES BY MOUTH ONE FOUR BEFORE APPOINTMENT  2  . darunavir (PREZISTA) 800 MG tablet TAKE 1  TABLET (800 MG TOTAL) BY MOUTH DAILY. 30 tablet 3  . elvitegravir-cobicistat-emtricitabine-tenofovir (GENVOYA) 150-150-200-10 MG TABS tablet Take 1 tablet by mouth daily with breakfast. 90 tablet 1  . escitalopram (LEXAPRO) 10 MG tablet TAKE 1 TABLET (10 MG TOTAL) BY MOUTH DAILY. 90 tablet 3  . furosemide (LASIX) 20 MG tablet Take 1 tablet (20 mg total) by mouth 2 (two) times daily. 90 tablet 3  . Golimumab ([place]y ARIA IV) Inject into the vein.    [name] HYDROmorphone (DILAUDID) 2 MG tablet Take 1 tablet (2 mg total) by mouth every 6 (six) hours as needed for severe pain. (Patient not taking: Reported on 07/22/2017) 100 tablet 0  . loratadine (CLARITIN) 10 MG tablet TAKE 1 TABLET (10 MG TOTAL) BY MOUTH DAILY. 90 tablet 3  . Menthol (ICY HOT) 5 % PTCH Apply 1 patch topically daily. Apply to right upper arm QAM and remove QHS x10 days    . methocarbamol (ROBAXIN) 500 MG tablet Take 1 tablet (500 mg total) by mouth every 8 (eight) hours as needed for muscle spasms. (Patient not taking: Reported on 07/22/2017) 75 tablet 4  . metoprolol succinate (TOPROL-XL) 25 MG 24 hr tablet Take 1 tablet (25 mg total) by mouth daily. 90 tablet 3  . mometasone (NASONEX) 50 MCG/ACT nasal spray Place 2 sprays into the nose daily as needed (for nasal congestion.).    [name] nitroGLYCERIN (NITRODUR - DOSED IN MG/24 HR) 0.2 mg/hr patch Place 1/4  to 1/2 of a patch over affected region. Remove and replace once daily.  Slightly alter skin placement daily (Patient not taking: Reported on 07/22/2017) 30 patch 1  . pantoprazole (PROTONIX) 40 MG tablet Take 1 tablet (40 mg total) by mouth 2 (two) times daily before a meal. (Patient not taking: Reported on 07/22/2017) 60 tablet 3  . polyethylene glycol (MIRALAX / GLYCOLAX) packet Take 17 g by mouth daily as needed for mild constipation. (Patient not taking: Reported on 07/22/2017) 14 each 0  . predniSONE (DELTASONE) 5 MG tablet Take 5 mg by mouth daily.  1  . pregabalin (LYRICA) 200 MG capsule  Take 1 capsule (200 mg total) by mouth 2 (two) times daily. 180 capsule 1  . promethazine-dextromethorphan (PROMETHAZINE-DM) 6.25-15 MG/5ML syrup Take 5 mLs by mouth 4 (four) times daily as needed for cough. 118 mL 0  . SAVAYSA 30 MG TABS tablet TAKE 1 TABLET (30 MG TOTAL) BY MOUTH DAILY. 90 tablet 1  . sulfamethoxazole-trimethoprim (BACTRIM DS,SEPTRA DS) 800-160 MG tablet TAKE 1 TABLET BY MOUTH EVERY 12 (TWELVE) HOURS. 60 tablet 3  . thiamine (VITAMIN B-1) 100 MG tablet Take 1 tablet (100 mg total) by mouth daily. 90 tablet 3  . valACYclovir (VALTREX) 500 MG tablet TAKE 1 TABLET BY MOUTH EVERY DAY 90 tablet 3   No current facility-administered medications for this visit.      Allergies:  Allergies  Allergen Reactions  . Other Anaphylaxis and Hives    Pecan  . Peanut-Containing Drug Products Anaphylaxis, Hives and Swelling    Swelling of throat  . Morphine Other (See Comments)    REACTION: severe headache  . Oxycodone-Acetaminophen Other (See Comments)    REACTION: headache  . Penicillins Rash and Other (See Comments)    FLUSHED, RED  Has patient had a PCN reaction causing immediate rash, facial/tongue/throat swelling, SOB or lightheadedness with hypotension: #  #  #  YES  #  #  #  Has patient had a PCN reaction causing severe rash involving mucus membranes or skin necrosis: No Has patient had a PCN reaction that required hospitalization No Has patient had a PCN reaction occurring within the last 10 years: No If all of the above answers are "NO", then may proceed with Cephalosporin use  . Promethazine Hcl Other (See Comments)    REACTION: makes him feel drunk    Past Medical History, Surgical history, Social history, and Family History were reviewed and updated.   Physical Exam: Blood pressure 119/67, pulse 64, temperature 97.9 F (36.6 C), temperature source Oral, resp. rate 20, height 5\' 9"  (1.753 m), weight 200 lb 11.2 oz (91 kg), SpO2 100 %.   ECOG: 1 General  appearance: Alert, awake gentleman without distress. Head: Atraumatic without abnormalities. Eyes: No scleral icterus. Oropharynx: Without any thrush or ulcers. Lymph nodes: No lymphadenopathy on palpation today in the cervical, axillary or supraclavicular regions. Heart: Regular rate and rhythm without any murmurs or gallops. Lung: Clear to auscultation without any rhonchi, wheezes or dullness to percussion. Abdomin: Soft, nontender without any rebound or guarding. Musculoskeletal: No joint deformity or effusion. Skin: No rashes or lesions.   Lab Results: Lab Results  Component Value Date   WBC 13.3 (H) 08/13/2017   HGB 13.6 07/09/2017   HCT 44.5 08/13/2017   MCV 91.0 08/13/2017   PLT 131 (L) 08/13/2017     Chemistry      Component Value Date/Time   NA 140 07/09/2017 0913   NA 140 08/13/2016 0919  K 3.8 07/09/2017 0913   K 3.7 08/13/2016 0919   CL 104 07/09/2017 0913   CO2 28 07/09/2017 0913   CO2 27 08/13/2016 0919   BUN 19 07/09/2017 0913   BUN 18.7 08/13/2016 0919   CREATININE 1.12 07/09/2017 0913   CREATININE 1.1 08/13/2016 0919   GLU 92 07/09/2016      Component Value Date/Time   CALCIUM 9.2 07/09/2017 0913   CALCIUM 8.8 08/13/2016 0919   ALKPHOS 85 12/25/2016 1056   ALKPHOS 114 08/13/2016 0919   AST 27 07/09/2017 0913   AST 21 08/13/2016 0919   ALT 21 07/09/2017 0913   ALT 18 08/13/2016 0919   BILITOT 0.7 07/09/2017 0913   BILITOT 0.59 08/13/2016 0919        Impression and Plan:  67 year old man with  1.  Stage 0 CLL presented with  CD38 positive, ZAP 70 positive in 2007.  He has been on active surveillance without any evidence of symptomatic lymphadenopathy.   Laboratory data from 08/13/2017 were reviewed today and compared to previous counts in the last few years.  The natural course of this disease was also discussed today in detail.  He continues to show stable counts with his lymphocyte count has not changed dramatically over the last few years.   This indicates more of an indolent course of this condition and likely will not require any treatment in the near future.  I have recommended continue active surveillance at this time and repeat laboratory testing in 6 months.  2. Chronic back pain: Continues to improve at this time and nearly resolved after his recent operation.  3.  HIV: Continues to follow with Dr. [name] and his CD4 count remain excellent.  4. Follow-up: In 03/2018  15  minutes was spent with the patient face-to-face today.  More than 50% of time was dedicated to patient counseling, education and coordination of his care.   Zola Button, MD 4/4/20199:29 AM

## 2017-09-17 DIAGNOSIS — Z21 Asymptomatic human immunodeficiency virus [HIV] infection status: Secondary | ICD-10-CM | POA: Diagnosis not present

## 2017-09-17 DIAGNOSIS — J849 Interstitial pulmonary disease, unspecified: Secondary | ICD-10-CM | POA: Diagnosis not present

## 2017-09-17 DIAGNOSIS — M150 Primary generalized (osteo)arthritis: Secondary | ICD-10-CM | POA: Diagnosis not present

## 2017-09-17 DIAGNOSIS — C9110 Chronic lymphocytic leukemia of B-cell type not having achieved remission: Secondary | ICD-10-CM | POA: Diagnosis not present

## 2017-09-17 DIAGNOSIS — M2550 Pain in unspecified joint: Secondary | ICD-10-CM | POA: Diagnosis not present

## 2017-09-17 DIAGNOSIS — M0609 Rheumatoid arthritis without rheumatoid factor, multiple sites: Secondary | ICD-10-CM | POA: Diagnosis not present

## 2017-09-17 DIAGNOSIS — B59 Pneumocystosis: Secondary | ICD-10-CM | POA: Diagnosis not present

## 2017-09-17 DIAGNOSIS — M1A09X0 Idiopathic chronic gout, multiple sites, without tophus (tophi): Secondary | ICD-10-CM | POA: Diagnosis not present

## 2017-09-17 DIAGNOSIS — E669 Obesity, unspecified: Secondary | ICD-10-CM | POA: Diagnosis not present

## 2017-09-17 DIAGNOSIS — Z6831 Body mass index (BMI) 31.0-31.9, adult: Secondary | ICD-10-CM | POA: Diagnosis not present

## 2017-09-24 DIAGNOSIS — M0609 Rheumatoid arthritis without rheumatoid factor, multiple sites: Secondary | ICD-10-CM | POA: Diagnosis not present

## 2017-09-24 DIAGNOSIS — Z79899 Other long term (current) drug therapy: Secondary | ICD-10-CM | POA: Diagnosis not present

## 2017-09-30 ENCOUNTER — Other Ambulatory Visit: Payer: Self-pay | Admitting: Behavioral Health

## 2017-09-30 ENCOUNTER — Other Ambulatory Visit: Payer: Self-pay | Admitting: Infectious Diseases

## 2017-09-30 DIAGNOSIS — B20 Human immunodeficiency virus [HIV] disease: Secondary | ICD-10-CM

## 2017-09-30 MED ORDER — DARUNAVIR ETHANOLATE 800 MG PO TABS: ORAL_TABLET | ORAL | 5 refills | Status: DC

## 2017-09-30 MED ORDER — ELVITEG-COBIC-EMTRICIT-TENOFAF 150-150-200-10 MG PO TABS: 1.0000 | ORAL_TABLET | Freq: Every day | ORAL | 1 refills | Status: DC

## 2017-10-15 DIAGNOSIS — H11823 Conjunctivochalasis, bilateral: Secondary | ICD-10-CM | POA: Diagnosis not present

## 2017-10-15 DIAGNOSIS — H04123 Dry eye syndrome of bilateral lacrimal glands: Secondary | ICD-10-CM | POA: Diagnosis not present

## 2017-10-15 DIAGNOSIS — Z961 Presence of intraocular lens: Secondary | ICD-10-CM | POA: Diagnosis not present

## 2017-10-15 DIAGNOSIS — H40013 Open angle with borderline findings, low risk, bilateral: Secondary | ICD-10-CM | POA: Diagnosis not present

## 2017-10-25 ENCOUNTER — Other Ambulatory Visit: Payer: Self-pay | Admitting: Internal Medicine

## 2017-10-25 DIAGNOSIS — M792 Neuralgia and neuritis, unspecified: Secondary | ICD-10-CM

## 2017-11-01 ENCOUNTER — Encounter: Payer: Self-pay | Admitting: Internal Medicine

## 2017-11-02 ENCOUNTER — Other Ambulatory Visit: Payer: Self-pay | Admitting: Internal Medicine

## 2017-11-02 DIAGNOSIS — M792 Neuralgia and neuritis, unspecified: Secondary | ICD-10-CM

## 2017-11-02 MED ORDER — PREGABALIN 200 MG PO CAPS: 200.0000 mg | ORAL_CAPSULE | Freq: Two times a day (BID) | ORAL | 1 refills | Status: DC

## 2017-11-05 ENCOUNTER — Other Ambulatory Visit: Payer: Self-pay | Admitting: Internal Medicine

## 2017-11-05 DIAGNOSIS — B59 Pneumocystosis: Secondary | ICD-10-CM

## 2017-11-19 ENCOUNTER — Emergency Department (HOSPITAL_COMMUNITY)
Admission: EM | Admit: 2017-11-19 | Discharge: 2017-11-19 | Disposition: A | Discharge status: Home / Self Care | Payer: Medicare Other | Attending: Emergency Medicine | Admitting: Emergency Medicine

## 2017-11-19 ENCOUNTER — Other Ambulatory Visit: Payer: Self-pay

## 2017-11-19 DIAGNOSIS — I2510 Atherosclerotic heart disease of native coronary artery without angina pectoris: Secondary | ICD-10-CM | POA: Diagnosis not present

## 2017-11-19 DIAGNOSIS — M0609 Rheumatoid arthritis without rheumatoid factor, multiple sites: Secondary | ICD-10-CM | POA: Diagnosis not present

## 2017-11-19 DIAGNOSIS — D72820 Lymphocytosis (symptomatic): Secondary | ICD-10-CM | POA: Diagnosis not present

## 2017-11-19 DIAGNOSIS — Z91010 Allergy to peanuts: Secondary | ICD-10-CM | POA: Insufficient documentation

## 2017-11-19 DIAGNOSIS — I252 Old myocardial infarction: Secondary | ICD-10-CM | POA: Insufficient documentation

## 2017-11-19 DIAGNOSIS — T782XXA Anaphylactic shock, unspecified, initial encounter: Secondary | ICD-10-CM

## 2017-11-19 DIAGNOSIS — B20 Human immunodeficiency virus [HIV] disease: Secondary | ICD-10-CM | POA: Insufficient documentation

## 2017-11-19 DIAGNOSIS — Y69 Unspecified misadventure during surgical and medical care: Secondary | ICD-10-CM | POA: Diagnosis not present

## 2017-11-19 DIAGNOSIS — Z8673 Personal history of transient ischemic attack (TIA), and cerebral infarction without residual deficits: Secondary | ICD-10-CM | POA: Insufficient documentation

## 2017-11-19 DIAGNOSIS — R0902 Hypoxemia: Secondary | ICD-10-CM | POA: Diagnosis not present

## 2017-11-19 DIAGNOSIS — T886XXA Anaphylactic reaction due to adverse effect of correct drug or medicament properly administered, initial encounter: Secondary | ICD-10-CM | POA: Diagnosis not present

## 2017-11-19 DIAGNOSIS — T7805XA Anaphylactic reaction due to tree nuts and seeds, initial encounter: Secondary | ICD-10-CM | POA: Diagnosis not present

## 2017-11-19 DIAGNOSIS — I10 Essential (primary) hypertension: Secondary | ICD-10-CM | POA: Insufficient documentation

## 2017-11-19 DIAGNOSIS — T7840XA Allergy, unspecified, initial encounter: Secondary | ICD-10-CM | POA: Diagnosis not present

## 2017-11-19 DIAGNOSIS — Z79899 Other long term (current) drug therapy: Secondary | ICD-10-CM | POA: Diagnosis not present

## 2017-11-19 DIAGNOSIS — I959 Hypotension, unspecified: Secondary | ICD-10-CM | POA: Diagnosis not present

## 2017-11-19 DIAGNOSIS — R609 Edema, unspecified: Secondary | ICD-10-CM | POA: Diagnosis not present

## 2017-11-19 LAB — PROTIME-INR
INR: 1.61
Prothrombin Time: 19 seconds — ABNORMAL HIGH (ref 11.4–15.2)

## 2017-11-19 LAB — CBC WITH DIFFERENTIAL/PLATELET
Band Neutrophils: 1 %
Basophils Absolute: 0 10*3/uL (ref 0.0–0.1)
Basophils Relative: 0 %
Blasts: 0 %
Eosinophils Absolute: 0.1 10*3/uL (ref 0.0–0.7)
Eosinophils Relative: 1 %
HCT: 42.8 % (ref 39.0–52.0)
Hemoglobin: 13.7 g/dL (ref 13.0–17.0)
Lymphocytes Relative: 59 %
Lymphs Abs: 5.8 10*3/uL — ABNORMAL HIGH (ref 0.7–4.0)
MCH: 31.4 pg (ref 26.0–34.0)
MCHC: 32 g/dL (ref 30.0–36.0)
MCV: 97.9 fL (ref 78.0–100.0)
Metamyelocytes Relative: 0 %
Monocytes Absolute: 0.2 10*3/uL (ref 0.1–1.0)
Monocytes Relative: 2 %
Myelocytes: 0 %
Neutro Abs: 3.7 10*3/uL (ref 1.7–7.7)
Neutrophils Relative %: 37 %
Other: 0 %
Platelets: 116 10*3/uL — ABNORMAL LOW (ref 150–400)
Promyelocytes Relative: 0 %
RBC: 4.37 MIL/uL (ref 4.22–5.81)
RDW: 15.7 % — ABNORMAL HIGH (ref 11.5–15.5)
WBC: 9.8 10*3/uL (ref 4.0–10.5)
nRBC: 0 /100 WBC

## 2017-11-19 LAB — COMPREHENSIVE METABOLIC PANEL
ALT: 29 U/L (ref 0–44)
AST: 41 U/L (ref 15–41)
Albumin: 4 g/dL (ref 3.5–5.0)
Alkaline Phosphatase: 77 U/L (ref 38–126)
Anion gap: 11 (ref 5–15)
BUN: 19 mg/dL (ref 8–23)
CO2: 23 mmol/L (ref 22–32)
Calcium: 8.5 mg/dL — ABNORMAL LOW (ref 8.9–10.3)
Chloride: 106 mmol/L (ref 98–111)
Creatinine, Ser: 1.2 mg/dL (ref 0.61–1.24)
GFR calc Af Amer: >60 mL/min (ref >60)
GFR calc non Af Amer: 60 mL/min — ABNORMAL LOW (ref >60)
Glucose, Bld: 126 mg/dL — ABNORMAL HIGH (ref 70–99)
Potassium: 3.5 mmol/L (ref 3.5–5.1)
Sodium: 140 mmol/L (ref 135–145)
Total Bilirubin: 1 mg/dL (ref 0.3–1.2)
Total Protein: 5.6 g/dL — ABNORMAL LOW (ref 6.5–8.1)

## 2017-11-19 LAB — I-STAT TROPONIN, ED: Troponin i, poc: 0 ng/mL (ref 0.00–0.08)

## 2017-11-19 MED ORDER — EPINEPHRINE 0.3 MG/0.3ML IJ SOAJ: 0.3000 mg | Freq: Once | INTRAMUSCULAR | 1 refills | Status: AC

## 2017-11-19 MED ORDER — PREDNISONE 20 MG PO TABS: ORAL_TABLET | ORAL | 0 refills | Status: DC

## 2017-11-19 MED ORDER — EPINEPHRINE 0.3 MG/0.3ML IJ SOAJ
0.3000 mg | Freq: Once | INTRAMUSCULAR | Status: AC
  Administered 2017-11-19: 0.3 mg via INTRAMUSCULAR
  Filled 2017-11-19: qty 0.3

## 2017-11-19 MED ORDER — METHYLPREDNISOLONE SODIUM SUCC 125 MG IJ SOLR
125.0000 mg | Freq: Once | INTRAMUSCULAR | Status: AC
  Administered 2017-11-19: 125 mg via INTRAVENOUS

## 2017-11-19 MED ORDER — DIPHENHYDRAMINE HCL 25 MG PO TABS: 25.0000 mg | ORAL_TABLET | Freq: Four times a day (QID) | ORAL | 0 refills | Status: DC

## 2017-11-19 MED ORDER — SODIUM CHLORIDE 0.9 % IV SOLN
Freq: Once | INTRAVENOUS | Status: AC
  Administered 2017-11-19: 13:00:00 via INTRAVENOUS

## 2017-11-19 MED ORDER — FAMOTIDINE IN NACL 20-0.9 MG/50ML-% IV SOLN
20.0000 mg | Freq: Once | INTRAVENOUS | Status: AC
  Administered 2017-11-19: 20 mg via INTRAVENOUS

## 2017-11-19 NOTE — ED Provider Notes (Signed)
[facility] EMERGENCY DEPARTMENT Provider Note   CSN: [id number] Arrival date & time: 11/19/17  1053     History   Chief Complaint Chief Complaint  Patient presents with  . Allergic Reaction    HPI [name] is a 67 y.o. male.  HPI  Patient was getting his Simponi Aria infusion at [facility] today.  He reports he had a 3 other times without adverse reaction.  Today he was getting the infusion and he ate a bag of prepackaged popcorn that was marked "nut free" because he has a nut allergy.  He reports within 10 minutes he started to feel like his throat was tightening and he is getting swelling of the eyes.  He started to feel lightheaded.  Dr. [name] Noon office, they administered Benadryl 50 mg and the patient's personal epinephrine pen which he carries.  Because of his persisting general weakness and reported feeling a general pressure in his chest combined with multiple medical comorbidities, they determined to send the patient emergency department by EMS.  To be for reports his vital signs did remain stable in our office.        Past Medical History:  Diagnosis Date  . Carotid artery occlusion    40-60% right ICA stenosis (09/2008)  . Chronic back pain   . CLL (chronic lymphoblastic leukemia) dx 2010   Followed at [facility] q[facility], no current therapy   . Coronary artery disease 2010   s/p CABG '10, sees Dr. Percival Spanish  . Depression   . Diverticulosis   . DVT, lower extremity, recurrent ([facility]) 2008, 2009   LLE, chronic anticoag since 2009  . Esophagitis   . Fibromyalgia   . Gallstones   . GERD (gastroesophageal reflux disease)   . Gout   . Gynecomastia, male   . H/O hiatal hernia 2008   surgery  . Hemorrhoids   . Hepatitis A yrs ago  . HIV infection ([place]) dx 1993  . Hypertension   . Impotence of organic origin   . Myocardial infarction ([place]) 2010    x 2  . Neuromuscular disorder (HCC)    neuropathy  . Osteoarthritis, knee    s/p  B TKA  . Pneumonia mrach, may, july 2017  . Rheumatoid arthritis(714.0) dx 2010   MTX, follows with rheum  . Seasonal allergies   . Secondary syphilis 07/24/14 dx   s/p 2 wks doxy  . Status post dilation of esophageal narrowing   . Stroke ([place]) [place]   . TIA (transient ischemic attack) 1997   mild residual L mouth droop  . Tubular adenoma of colon     Patient Active Problem List   Diagnosis Date Noted  . Subacute maxillary sinusitis 03/03/2017  . Viral URI with cough 02/26/2017  . 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 Traumatic hematoma of hip, left, initial encounter 12/18/2016  . Carotid artery disease ([place]) 11/13/2016  . Stenosis of carotid artery 11/13/2016  . Spinal stenosis of thoracolumbar region 07/02/2016  . Incidental lung nodule, > 68mm and < 55mm 05/14/2016  . Chronic low back pain with sciatica 05/13/2016  . ILD (interstitial lung disease) ([place]) 09/13/2015  . Immunocompromised state ([place]) 09/13/2015  . Tinea cruris 09/12/2015  . Decubitus ulcer 07/11/2015  . PCP (pneumocystis jiroveci pneumonia) ([place]) 06/18/2015  . Postinflammatory pulmonary fibrosis ([place]) 05/11/2015  . Secondary syphilis of skin 07/24/2014  . Insomnia 11/11/2013  . GERD (gastroesophageal reflux disease)   . Hereditary and idiopathic  peripheral neuropathy 09/08/2013  . Erosive esophagitis 12/28/2012  . Depression 11/16/2012  . Allergic rhinitis, cause unspecified 04/28/2012  . Long term current use of anticoagulant therapy 02/03/2012  . Hypertension   . DVT, lower extremity, recurrent ([place])   . Osteoarthritis, knee   . Gout   . Chronic back pain   . Carotid artery occlusion   . Hyperlipidemia with target LDL less than 100   . HIV infection ([place]) 04/08/2011  . Arthritis, rheumatoid ([place]) 04/08/2011  . Chronic lymphoblastic leukemia ([place]) 04/08/2011  . Impotence of organic origin 04/02/2011  . CAD (coronary artery disease) of artery bypass graft 02/14/2009  . Hyperbilirubinemia  06/26/2008  . Pure hypercholesterolemia 06/26/2008  . Herpes genitalis 06/22/2008    Past Surgical History:  Procedure Laterality Date  . CHOLECYSTECTOMY    . COLONOSCOPY WITH PROPOFOL N/A 12/28/2012   Procedure: COLONOSCOPY WITH PROPOFOL;  Surgeon: Jerene Bears, MD;  Location: [facility];  Service: Gastroenterology;  Laterality: N/A;  . CORONARY ARTERY BYPASS GRAFT  2010   triple bypass  . ESOPHAGOGASTRODUODENOSCOPY (EGD) WITH PROPOFOL N/A 12/28/2012   Procedure: ESOPHAGOGASTRODUODENOSCOPY (EGD) WITH PROPOFOL;  Surgeon: Jerene Bears, MD;  Location: [facility];  Service: Gastroenterology;  Laterality: N/A;  . ESOPHAGOGASTRODUODENOSCOPY (EGD) WITH PROPOFOL N/A 03/15/2013   Procedure: ESOPHAGOGASTRODUODENOSCOPY (EGD) WITH PROPOFOL;  Surgeon: Jerene Bears, MD;  Location: [facility];  Service: Gastroenterology;  Laterality: N/A;  . ESOPHAGOGASTRODUODENOSCOPY (EGD) WITH PROPOFOL N/A 02/07/2016   Procedure: ESOPHAGOGASTRODUODENOSCOPY (EGD) WITH PROPOFOL;  Surgeon: Jerene Bears, MD;  Location: [facility];  Service: Gastroenterology;  Laterality: N/A;  . HARDWARE REMOVAL N/A 07/02/2012   Procedure: HARDWARE REMOVAL;  Surgeon: Elaina Hoops, MD;  Location: [facility];  Service: Neurosurgery;  Laterality: N/A;  . HIATAL HERNIA REPAIR     wrap  . INGUINAL HERNIA REPAIR Bilateral   . JOINT REPLACEMENT Left 1999  . KNEE ARTHROPLASTY  07/22/2011   Procedure: COMPUTER ASSISTED TOTAL KNEE ARTHROPLASTY;  Surgeon: Meredith Pel, MD;  Location: [facility];  Service: Orthopedics;  Laterality: Right;  Right total knee arthroplasty  . MANDIBLE SURGERY Bilateral    tmj  . REPLACEMENT TOTAL KNEE Bilateral   . ring around testicle hernia reapir  184 and 1986   x 2  . ROTATOR CUFF REPAIR Right   . SHOULDER SURGERY Left   . SPINE SURGERY  2010   "rod and screws", "failed", lopwer spine,   . stent to heart x 1  2010  . TONSILLECTOMY    . UMBILICAL HERNIA REPAIR     x 1  . varicose vein     stripping    . VIDEO BRONCHOSCOPY Bilateral 10/16/2015   Procedure: VIDEO BRONCHOSCOPY WITHOUT FLUORO;  Surgeon: Brand Males, MD;  Location: [facility];  Service: Cardiopulmonary;  Laterality: Bilateral;        Home Medications    Prior to Admission medications   Medication Sig Start Date End Date Taking? Authorizing Provider  acetaminophen (TYLENOL) 325 MG tablet Take 2 tablets (650 mg total) by mouth every 6 (six) hours as needed for mild pain (or Fever >/= 101). 05/24/16  Yes Robbie Lis, MD  atorvastatin (LIPITOR) 10 MG tablet Take 1 tablet (10 mg total) by mouth at bedtime. 04/28/17  Yes [name], MD  darunavir (PREZISTA) 800 MG tablet TAKE 1 TABLET (800 MG TOTAL) BY MOUTH DAILY. 09/30/17  Yes Campbell Riches, MD  elvitegravir-cobicistat-emtricitabine-tenofovir (GENVOYA) 150-150-200-10 MG TABS tablet Take 1 tablet by mouth daily  with breakfast. 09/30/17  Yes Campbell Riches, MD  escitalopram (LEXAPRO) 10 MG tablet TAKE 1 TABLET (10 MG TOTAL) BY MOUTH DAILY. 03/23/17  Yes [name], MD  folic acid (FOLVITE) 1 MG tablet Take 1 mg by mouth daily. 09/17/17  Yes [provider]  furosemide (LASIX) 20 MG tablet Take 1 tablet (20 mg total) by mouth 2 (two) times daily. 02/18/17  Yes [name], MD  Golimumab [facility] ARIA IV) Inject into the vein.   Yes [provider]  loratadine (CLARITIN) 10 MG tablet TAKE 1 TABLET (10 MG TOTAL) BY MOUTH DAILY. 03/10/17  Yes [name], MD  methotrexate (RHEUMATREX) 2.5 MG tablet Take 7.5 mg by mouth once a week. For 28 days 10/15/17  Yes [provider]  metoprolol succinate (TOPROL-XL) 25 MG 24 hr tablet Take 1 tablet (25 mg total) by mouth daily. 01/05/17  Yes [name], MD  mometasone (NASONEX) 50 MCG/ACT nasal spray Place 2 sprays into the nose daily as needed (for nasal congestion.).   Yes [provider]  predniSONE (DELTASONE) 5 MG tablet Take 5 mg by mouth daily. 11/02/16  Yes [provider]  pregabalin (LYRICA) 200 MG capsule Take 1 capsule (200 mg total) by mouth 2 (two) times daily. 11/02/17  Yes [name], MD  SAVAYSA 30 MG TABS tablet TAKE 1 TABLET (30 MG TOTAL) BY MOUTH DAILY. 08/12/17  Yes [name], MD  sulfamethoxazole-trimethoprim (BACTRIM DS,SEPTRA DS) 800-160 MG tablet TAKE 1 TABLET BY MOUTH EVERY 12 (TWELVE) HOURS. 11/06/17  Yes [name], MD  thiamine (VITAMIN B-1) 100 MG tablet Take 1 tablet (100 mg total) by mouth daily. 12/29/16  Yes [name], MD  valACYclovir (VALTREX) 500 MG tablet TAKE 1 TABLET BY MOUTH EVERY DAY 03/10/17  Yes Comer, Okey Regal, MD  ciclopirox (LOPROX) 0.77 % SUSP APPLY 1 ACT TOPICALLY 2 (TWO) TIMES DAILY. Patient not taking: Reported on 11/19/2017 01/02/17   [name], MD  clindamycin (CLEOCIN) 150 MG capsule TAKE 4 CAPSULES BY MOUTH ONE FOUR BEFORE APPOINTMENT 09/25/16   [provider]  diphenhydrAMINE (BENADRYL) 25 MG tablet Take 1 tablet (25 mg total) by mouth every 6 (six) hours. 11/19/17   Charlesetta Shanks, MD  nitroGLYCERIN (NITRODUR - DOSED IN MG/24 HR) 0.2 mg/hr patch Place 1/4 to 1/2 of a patch over affected region. Remove and replace once daily.  Slightly alter skin placement daily Patient not taking: Reported on 07/22/2017 12/18/16   Gerda Diss, DO  polyethylene glycol [facility] / GLYCOLAX) packet Take 17 g by mouth daily as needed for mild constipation. Patient not taking: Reported on 11/19/2017 05/18/16   Theodis Blaze, MD  predniSONE (DELTASONE) 20 MG tablet 2 tabs po daily x 3 days 11/19/17   Charlesetta Shanks, MD    Family History Family History  Problem Relation Age of Onset  . Breast cancer Mother   . Hypertension Mother   . Hyperlipidemia Mother   . Diabetes Mother   . Prostate cancer Father   . Colon polyps Father   . Hyperlipidemia Father   . Crohn's disease Paternal Aunt   . Diabetes Maternal Grandmother   . Diabetes Brother        x 3  . Heart disease Brother        x 3    . Hyperlipidemia Brother        x 3  . Alcohol abuse Daughter   . Drug abuse  Daughter   . Asthma Brother   . Colon cancer Neg Hx     Social History Social History   Tobacco Use  . Smoking status: Never Smoker  . Smokeless tobacco: Never Used  . Tobacco comment: occ wine  Substance Use Topics  . Alcohol use: Yes    Comment: occasional wine- 1-2 per week  . Drug use: No     Allergies   Other; Peanut-containing drug products; Morphine; Oxycodone-acetaminophen; Penicillins; and Promethazine hcl   Review of Systems Review of Systems 10 Systems reviewed and are negative for acute change except as noted in the HPI.   Physical Exam Updated Vital Signs BP 113/77   Pulse 69   Resp (!) 25   Ht 5\' 9"  (1.753 m)   Wt 88.5 kg (195 lb)   SpO2 95%   BMI 28.80 kg/m   Physical Exam  Constitutional:  On arrival, patient is very fatigued in appearance.  He does not have active respiratory distress but does not appear well.  HENT:  Slight puffiness about both eyes.  Airway is widely patent.  No oral swelling.  No stridor.  Neck: Neck supple.  Cardiovascular: Normal rate, regular rhythm and normal heart sounds.  Pulmonary/Chest: Effort normal and breath sounds normal.  Abdominal: Soft. He exhibits no distension. There is no tenderness. There is no guarding.  Musculoskeletal: Normal range of motion.  Neurological: He is alert. He exhibits normal muscle tone. Coordination normal.  Skin: Skin is warm and dry.     ED Treatments / Results  Labs (all labs ordered are listed, but only abnormal results are displayed) Labs Reviewed  CBC WITH DIFFERENTIAL/PLATELET - Abnormal; Notable for the following components:      Result Value   RDW 15.7 (*)    Platelets 116 (*)    Lymphs Abs 5.8 (*)    All other components within normal limits  PROTIME-INR - Abnormal; Notable for the following components:   Prothrombin Time 19.0 (*)    All other components within normal limits   COMPREHENSIVE METABOLIC PANEL - Abnormal; Notable for the following components:   Glucose, Bld 126 (*)    Calcium 8.5 (*)    Total Protein 5.6 (*)    GFR calc non Af Amer 60 (*)    All other components within normal limits  PATHOLOGIST SMEAR REVIEW  I-STAT TROPONIN, ED    EKG None  Radiology No results found.  Procedures Procedures (including critical care time) CRITICAL CARE Performed by: Charlesetta Shanks   Total critical care time: 45 minutes  Critical care time was exclusive of separately billable procedures and treating other patients.  Critical care was necessary to treat or prevent imminent or life-threatening deterioration.  Critical care was time spent personally by me on the following activities: development of treatment plan with patient and/or surrogate as well as nursing, discussions with consultants, evaluation of patient's response to treatment, examination of patient, obtaining history from patient or surrogate, ordering and performing treatments and interventions, ordering and review of laboratory studies, ordering and review of radiographic studies, pulse oximetry and re-evaluation of patient's condition. Medications Ordered in ED Medications  methylPREDNISolone sodium succinate (SOLU-MEDROL) 125 mg/2 mL injection 125 mg (125 mg Intravenous Given 11/19/17 1113)  famotidine (PEPCID) IVPB 20 mg premix (0 mg Intravenous Stopped 11/19/17 1248)  EPINEPHrine (EPI-PEN) injection 0.3 mg (0.3 mg Intramuscular Given 11/19/17 1529)  0.9 %  sodium chloride infusion ( Intravenous Stopped 11/19/17 1527)     Initial Impression / Assessment  and Plan / ED Course  I have reviewed the triage vital signs and the nursing notes.  Pertinent labs & imaging results that were available during my care of the patient were reviewed by me and considered in my medical decision making (see chart for details).    Consult: (11: 08) reviewed with [name].  He advises he suspects the Simponi  Aria infusion as the recipient of allergic reaction.  He reports the patient did eat the popcorn that he illustrated but he has to assume it was medication related.  Patient was given his own epinephrine pen and 50 mg of Benadryl in the office.  He was referred to the emergency department because he continued to feel weak short of breath.  His vital signs did remain stable while in the office.  Final Clinical Impressions(s) / ED Diagnoses   Final diagnoses:  Anaphylaxis, initial encounter   On arrival, patient had unwell appearance.  He is extremely weak generally.  Concern was for anaphylactic reaction.  Had just gotten infusion of Simponi Aria.  [name] thought this was the likely etiology.  Patient coincidentally is also eaten a packaged popcorn snack.  Patient has known nut allergy but this was clearly marked "nut free".  Patient had given himself his own EpiPen prior to arrival.  Due to continued swelling about the eyes and generally ill appearance after allergic reaction, patient was given 0.3 mg EpiPen, additional 25 mg of Benadryl, Pepcid and fluids.  After 2 hours observation patient was much improved and back to baseline.  Discharged with return precautions reviewed. ED Discharge Orders        Ordered    EPINEPHrine (EPIPEN 2-PAK) 0.3 mg/0.3 mL IJ SOAJ injection   Once     11/19/17 1517    predniSONE (DELTASONE) 20 MG tablet     11/19/17 1517    diphenhydrAMINE (BENADRYL) 25 MG tablet  Every 6 hours     11/19/17 1517       Charlesetta Shanks, MD 11/21/17 1726

## 2017-11-19 NOTE — ED Triage Notes (Addendum)
To ED via [facility] from [place] with c/o allergic rxn fro eating popcorn-- pt has nut allergy- popcorn bag states that it is not free-- pt is sleepy, feels weak all over-- drove himself to Dr's office.  Pt has IV 24g in left forearm from dr's office -- received EPI pen at office and Benadryl 50mg  IV per EMS enroute

## 2017-11-20 LAB — PATHOLOGIST SMEAR REVIEW

## 2017-12-02 ENCOUNTER — Encounter: Payer: Self-pay | Admitting: Infectious Diseases

## 2017-12-02 ENCOUNTER — Encounter: Payer: Self-pay | Admitting: Cardiology

## 2017-12-14 ENCOUNTER — Other Ambulatory Visit: Payer: Self-pay | Admitting: Internal Medicine

## 2017-12-14 DIAGNOSIS — E5111 Dry beriberi: Secondary | ICD-10-CM

## 2017-12-17 ENCOUNTER — Ambulatory Visit (HOSPITAL_COMMUNITY)
Admission: EM | Admit: 2017-12-17 | Discharge: 2017-12-17 | Disposition: A | Discharge status: Home / Self Care | Payer: Medicare Other | Attending: Family Medicine | Admitting: Family Medicine

## 2017-12-17 ENCOUNTER — Encounter (HOSPITAL_COMMUNITY): Payer: Self-pay

## 2017-12-17 DIAGNOSIS — H9201 Otalgia, right ear: Secondary | ICD-10-CM | POA: Diagnosis not present

## 2017-12-17 DIAGNOSIS — T161XXA Foreign body in right ear, initial encounter: Secondary | ICD-10-CM | POA: Diagnosis not present

## 2017-12-17 NOTE — ED Triage Notes (Signed)
Pt presents with pain in right ear, pt believes a piece of broken hearing aid may be stuck in ear

## 2017-12-17 NOTE — Discharge Instructions (Addendum)
Removed foreign body from ear Rest Use OTC tylenol or ibuprofen as needed for pain relief Follow up with PCP if symptoms persists  Return or go to the ER if you have any new or worsening symptoms

## 2017-12-17 NOTE — ED Provider Notes (Addendum)
[place]   [id number] 12/17/17 Arrival Time: 1062  SUBJECTIVE: History from: patient.  [name] is a 67 y.o. male who presents with right ear pain for the past 3-4 days.  Patient states a piece of his hearing aid broke off in his ear. Patient states the pain is intermittent and sharp in character.  Patient has NOT tried removing the hearing aid.  Symptoms are made worse with lying down on right side.  Denies similar symptoms in the past.    Complains of decreased hearing.  Denies fever, chills, fatigue, sinus pain, rhinorrhea, ear discharge, sore throat, SOB, wheezing, chest pain, nausea, changes in bowel or bladder habits.    ROS: As per HPI.  Past Medical History:  Diagnosis Date  . Carotid artery occlusion    40-60% right ICA stenosis (09/2008)  . Chronic back pain   . CLL (chronic lymphoblastic leukemia) dx 2010   Followed at [facility] q67[facility], no current therapy   . Coronary artery disease 2010   s/p CABG '10, sees Dr. Percival Spanish  . Depression   . Diverticulosis   . DVT, lower extremity, recurrent ([place]) 2008, 2009   LLE, chronic anticoag since 2009  . Esophagitis   . Fibromyalgia   . Gallstones   . GERD (gastroesophageal reflux disease)   . Gout   . Gynecomastia, male   . H/O hiatal hernia 2008   surgery  . Hemorrhoids   . Hepatitis A yrs ago  . HIV infection ([place]) dx 1993  . Hypertension   . Impotence of organic origin   . Myocardial infarction ([place]) 2010    x 2  . Neuromuscular disorder (HCC)    neuropathy  . Osteoarthritis, knee    s/p B TKA  . Pneumonia mrach, may, july 2017  . Rheumatoid arthritis(714.0) dx 2010   MTX, follows with rheum  . Seasonal allergies   . Secondary syphilis 07/24/14 dx   s/p 2 wks doxy  . Status post dilation of esophageal narrowing   . Stroke ([place]) [place]   . TIA (transient ischemic attack) 1997   mild residual L mouth droop  . Tubular adenoma of colon    Past Surgical History:  Procedure Laterality Date  .  CHOLECYSTECTOMY    . COLONOSCOPY WITH PROPOFOL N/A 12/28/2012   Procedure: COLONOSCOPY WITH PROPOFOL;  Surgeon: Jerene Bears, MD;  [facility];  Service: Gastroenterology;  Laterality: N/A;  . CORONARY ARTERY BYPASS GRAFT  2010   triple bypass  . ESOPHAGOGASTRODUODENOSCOPY (EGD) WITH PROPOFOL N/A 12/28/2012   Procedure: ESOPHAGOGASTRODUODENOSCOPY (EGD) WITH PROPOFOL;  Surgeon: Jerene Bears, MD;  [facility];  Service: Gastroenterology;  Laterality: N/A;  . ESOPHAGOGASTRODUODENOSCOPY (EGD) WITH PROPOFOL N/A 03/15/2013   Procedure: ESOPHAGOGASTRODUODENOSCOPY (EGD) WITH PROPOFOL;  Surgeon: Jerene Bears, MD;  [facility];  Service: Gastroenterology;  Laterality: N/A;  . ESOPHAGOGASTRODUODENOSCOPY (EGD) WITH PROPOFOL N/A 02/07/2016   Procedure: ESOPHAGOGASTRODUODENOSCOPY (EGD) WITH PROPOFOL;  Surgeon: Jerene Bears, MD;  [facility];  Service: Gastroenterology;  Laterality: N/A;  . HARDWARE REMOVAL N/A 07/02/2012   Procedure: HARDWARE REMOVAL;  Surgeon: Elaina Hoops, MD;  Location: [facility];  Service: Neurosurgery;  Laterality: N/A;  . HIATAL HERNIA REPAIR     wrap  . INGUINAL HERNIA REPAIR Bilateral   . JOINT REPLACEMENT Left 1999  . KNEE ARTHROPLASTY  07/22/2011   Procedure: COMPUTER ASSISTED TOTAL KNEE ARTHROPLASTY;  Surgeon: Meredith Pel, MD;  Location: [facility];  Service: Orthopedics;  Laterality: Right;  Right total knee arthroplasty  . MANDIBLE SURGERY Bilateral    tmj  . REPLACEMENT TOTAL KNEE Bilateral   . ring around testicle hernia reapir  184 and 1986   x 2  . ROTATOR CUFF REPAIR Right   . SHOULDER SURGERY Left   . SPINE SURGERY  2010   "rod and screws", "failed", lopwer spine,   . stent to heart x 1  2010  . TONSILLECTOMY    . UMBILICAL HERNIA REPAIR     x 1  . varicose vein     stripping  . VIDEO BRONCHOSCOPY Bilateral 10/16/2015   Procedure: VIDEO BRONCHOSCOPY WITHOUT FLUORO;  Surgeon: Brand Males, MD;  [facility];   Service: Cardiopulmonary;  Laterality: Bilateral;   Allergies  Allergen Reactions  . Other Anaphylaxis and Hives    Pecan  . Peanut-Containing Drug Products Anaphylaxis, Hives and Swelling    Swelling of throat  . Morphine Other (See Comments)    REACTION: severe headache  . Oxycodone-Acetaminophen Other (See Comments)    REACTION: headache  . Penicillins Rash and Other (See Comments)    FLUSHED, RED  Has patient had a PCN reaction causing immediate rash, facial/tongue/throat swelling, SOB or lightheadedness with hypotension: #  #  #  YES  #  #  #  Has patient had a PCN reaction causing severe rash involving mucus membranes or skin necrosis: No Has patient had a PCN reaction that required hospitalization No Has patient had a PCN reaction occurring within the last 10 years: No If all of the above answers are "NO", then may proceed with Cephalosporin use  . Promethazine Hcl Other (See Comments)    REACTION: makes him feel drunk at higher strengths   No current facility-administered medications on file prior to encounter.    Current Outpatient Medications on File Prior to Encounter  Medication Sig Dispense Refill  . acetaminophen (TYLENOL) 325 MG tablet Take 2 tablets (650 mg total) by mouth every 6 (six) hours as needed for mild pain (or Fever >/= 101). 30 tablet 0  . atorvastatin (LIPITOR) 10 MG tablet Take 1 tablet (10 mg total) by mouth at bedtime. 90 tablet 3  . ciclopirox (LOPROX) 0.77 % SUSP APPLY 1 ACT TOPICALLY 2 (TWO) TIMES DAILY. (Patient not taking: Reported on 11/19/2017) 60 mL 3  . clindamycin (CLEOCIN) 150 MG capsule TAKE 4 CAPSULES BY MOUTH ONE FOUR BEFORE APPOINTMENT  2  . darunavir (PREZISTA) 800 MG tablet TAKE 1 TABLET (800 MG TOTAL) BY MOUTH DAILY. 30 tablet 5  . diphenhydrAMINE (BENADRYL) 25 MG tablet Take 1 tablet (25 mg total) by mouth every 6 (six) hours. 20 tablet 0  . elvitegravir-cobicistat-emtricitabine-tenofovir (GENVOYA) 150-150-200-10 MG TABS tablet Take 1  tablet by mouth daily with breakfast. 90 tablet 1  . escitalopram (LEXAPRO) 10 MG tablet TAKE 1 TABLET (10 MG TOTAL) BY MOUTH DAILY. 90 tablet 3  . folic acid (FOLVITE) 1 MG tablet Take 1 mg by mouth daily.  11  . furosemide (LASIX) 20 MG tablet Take 1 tablet (20 mg total) by mouth 2 (two) times daily. 90 tablet 3  . Golimumab ([place] ARIA IV) Inject into the vein.    [name] loratadine (CLARITIN) 10 MG tablet TAKE 1 TABLET (10 MG TOTAL) BY MOUTH DAILY. 90 tablet 3  . methotrexate (RHEUMATREX) 2.5 MG tablet Take 7.5 mg by mouth once a week. For 28 days  3  . metoprolol succinate (TOPROL-XL) 25 MG 24 hr tablet  Take 1 tablet (25 mg total) by mouth daily. 90 tablet 3  . mometasone (NASONEX) 50 MCG/ACT nasal spray Place 2 sprays into the nose daily as needed (for nasal congestion.).    [name] nitroGLYCERIN (NITRODUR - DOSED IN MG/24 HR) 0.2 mg/hr patch Place 1/4 to 1/2 of a patch over affected region. Remove and replace once daily.  Slightly alter skin placement daily (Patient not taking: Reported on 07/22/2017) 30 patch 1  . polyethylene glycol (MIRALAX / GLYCOLAX) packet Take 17 g by mouth daily as needed for mild constipation. (Patient not taking: Reported on 11/19/2017) 14 each 0  . predniSONE (DELTASONE) 20 MG tablet 2 tabs po daily x 3 days 6 tablet 0  . predniSONE (DELTASONE) 5 MG tablet Take 5 mg by mouth daily.  1  . pregabalin (LYRICA) 200 MG capsule Take 1 capsule (200 mg total) by mouth 2 (two) times daily. 180 capsule 1  . SAVAYSA 30 MG TABS tablet TAKE 1 TABLET (30 MG TOTAL) BY MOUTH DAILY. 90 tablet 1  . sulfamethoxazole-trimethoprim (BACTRIM DS,SEPTRA DS) 800-160 MG tablet TAKE 1 TABLET BY MOUTH EVERY 12 (TWELVE) HOURS. 60 tablet 3  . thiamine ([facility] B-1) 100 MG tablet Take 1 tablet (100 mg total) by mouth daily. 90 tablet 1  . valACYclovir (VALTREX) 500 MG tablet TAKE 1 TABLET BY MOUTH EVERY DAY 90 tablet 3   Social History   Socioeconomic History  . Marital status: Widowed    Spouse name: Not  on file  . Number of children: 3  . Years of education: Not on file  . Highest education level: Not on file  Occupational History  . Occupation: retired  Scientific laboratory technician  . Financial resource strain: Not on file  . Food insecurity:    Worry: Not on file    Inability: Not on file  . Transportation needs:    Medical: Not on file    Non-medical: Not on file  Tobacco Use  . Smoking status: Never Smoker  . Smokeless tobacco: Never Used  . Tobacco comment: occ wine  Substance and Sexual Activity  . Alcohol use: Yes    Comment: occasional wine- 1-2 per week  . Drug use: No  . Sexual activity: Never    Comment: pt. given condoms  Lifestyle  . Physical activity:    Days per week: Not on file    Minutes per session: Not on file  . Stress: Not on file  Relationships  . Social connections:    Talks on phone: Not on file    Gets together: Not on file    Attends religious service: Not on file    Active member of club or organization: Not on file    Attends meetings of clubs or organizations: Not on file    Relationship status: Not on file  . Intimate partner violence:    Fear of current or ex partner: Not on file    Emotionally abused: Not on file    Physically abused: Not on file    Forced sexual activity: Not on file  Other Topics Concern  . Not on file  Social History Narrative  . Not on file   Family History  Problem Relation Age of Onset  . Breast cancer Mother   . Hypertension Mother   . Hyperlipidemia Mother   . Diabetes Mother   . Prostate cancer Father   . Colon polyps Father   . Hyperlipidemia Father   . Crohn's disease Paternal Aunt   .  Diabetes Maternal Grandmother   . Diabetes Brother        x 3  . Heart disease Brother        x 3  . Hyperlipidemia Brother        x 3  . Alcohol abuse Daughter   . Drug abuse Daughter   . Asthma Brother   . Colon cancer Neg Hx     OBJECTIVE:  Vitals:   12/17/17 1046  BP: 127/67  Pulse: 67  Resp: 16  Temp: 97.9 F  (36.6 C)  TempSrc: Oral  SpO2: 100%     General appearance: alert; oriented HEENT: Ears: RT EAC obstructed by foreign body, FB removed with alligator forceps, patient tolerated removal well; LT EAC clear, TM pearly gray; Eyes: PERRL.  EOM grossly intact.  Sinuses nontender; Nose: mild clear rhinorrhea; tonsils nonerythematous, uvula midline Neck: supple without LAD Lungs: CTAB Skin: warm and dry Psychological: alert and cooperative; normal mood and affect   ASSESSMENT & PLAN:  1. Foreign body in ear, right, initial encounter   2. Right ear pain     No orders of the defined types were placed in this encounter.  Removed foreign body from ear Rest Use OTC tylenol or ibuprofen as needed for pain relief Follow up with PCP if symptoms persists  Return or go to the ER if you have any new or worsening symptoms   Reviewed expectations re: course of current medical issues. Questions answered. Outlined signs and symptoms indicating need for more acute intervention. Patient verbalized understanding. After Visit Summary given.     Lestine Box, PA-C 12/17/17 1133

## 2017-12-23 NOTE — Progress Notes (Signed)
HPI The patient presented recently for followup of his known coronary disease.   He has had progressive dyspnea.  He did have a negative stress perfusion study for any evidence of reversible ischemia in 2016 although there was a fixed defect. His ejection fractions well-preserved. It was well preserved on echo with some tricuspid regurgitation.  He did have some slight abnormalities on pulmonary function testing and has seen [name].  He had some interstitial lung disease thought possibly to be related to his rheumatoid arthritis.   Since I last saw him he had an allergic reaction to something which may have been 1 of his infusions for rheumatoid arthritis or perhaps an insect bite or even popcorn.  However, he has had no cardiovascular complaints of chest discomfort or new shortness of breath.  He did have one episode of arm discomfort last week.  This was sudden left arm discomfort that was unlike his previous angina.  There is no associated chest pain.  There is no associated nausea vomiting or diaphoresis.  Again he says it was not like his previous angina.  It lasted about 15 minutes and went away spontaneously.  He has had none since then.  He is able to ride his lawnmower but is mostly limited by orthopedic problems.  He did have some heart fluttering in the past with about 10 minutes of palpitations but he has not had any of this since last year.  He has had some lightheadedness and dizziness and presyncope but this is always associated with low blood sugars.  He walks with a cane for support.  He rides a lawnmower for [place] acres.     Allergies  Allergen Reactions  . Other Anaphylaxis and Hives    Pecan  . Peanut-Containing Drug Products Anaphylaxis, Hives and Swelling    Swelling of throat  . Morphine Other (See Comments)    REACTION: severe headache  . Oxycodone-Acetaminophen Other (See Comments)    REACTION: headache  . Penicillins Rash and Other (See Comments)    FLUSHED,  RED  Has patient had a PCN reaction causing immediate rash, facial/tongue/throat swelling, SOB or lightheadedness with hypotension: #  #  #  YES  #  #  #  Has patient had a PCN reaction causing severe rash involving mucus membranes or skin necrosis: No Has patient had a PCN reaction that required hospitalization No Has patient had a PCN reaction occurring within the last 10 years: No If all of the above answers are "NO", then may proceed with Cephalosporin use  . Promethazine Hcl Other (See Comments)    REACTION: makes him feel drunk at higher strengths    Current Outpatient Medications  Medication Sig Dispense Refill  . acetaminophen (TYLENOL) 325 MG tablet Take 2 tablets (650 mg total) by mouth every 6 (six) hours as needed for mild pain (or Fever >/= 101). 30 tablet 0  . atorvastatin (LIPITOR) 10 MG tablet Take 1 tablet (10 mg total) by mouth at bedtime. 90 tablet 3  . clindamycin (CLEOCIN) 150 MG capsule TAKE 4 CAPSULES BY MOUTH ONE FOUR BEFORE APPOINTMENT  2  . darunavir (PREZISTA) 800 MG tablet TAKE 1 TABLET (800 MG TOTAL) BY MOUTH DAILY. 30 tablet 5  . diphenhydrAMINE (BENADRYL) 25 MG tablet Take 1 tablet (25 mg total) by mouth every 6 (six) hours. 20 tablet 0  . elvitegravir-cobicistat-emtricitabine-tenofovir (GENVOYA) 150-150-200-10 MG TABS tablet Take 1 tablet by mouth daily with breakfast. 90 tablet 1  . escitalopram (  LEXAPRO) 10 MG tablet TAKE 1 TABLET (10 MG TOTAL) BY MOUTH DAILY. 90 tablet 3  . folic acid (FOLVITE) 1 MG tablet Take 1 mg by mouth daily.  11  . furosemide (LASIX) 20 MG tablet Take 1 tablet (20 mg total) by mouth 2 (two) times daily. 90 tablet 3  . Golimumab ([place] ARIA IV) Inject into the vein.    [name] loratadine (CLARITIN) 10 MG tablet TAKE 1 TABLET (10 MG TOTAL) BY MOUTH DAILY. 90 tablet 3  . methotrexate (RHEUMATREX) 2.5 MG tablet Take 7.5 mg by mouth once a week. For 28 days  3  . metoprolol succinate (TOPROL-XL) 25 MG 24 hr tablet Take 1 tablet (25 mg total)  by mouth daily. 90 tablet 3  . mometasone (NASONEX) 50 MCG/ACT nasal spray Place 2 sprays into the nose daily as needed (for nasal congestion.).    [name] nitroGLYCERIN (NITRODUR - DOSED IN MG/24 HR) 0.2 mg/hr patch Place 1/4 to 1/2 of a patch over affected region. Remove and replace once daily.  Slightly alter skin placement daily 30 patch 1  . polyethylene glycol (MIRALAX / GLYCOLAX) packet Take 17 g by mouth daily as needed for mild constipation. 14 each 0  . predniSONE (DELTASONE) 5 MG tablet Take 5 mg by mouth daily.  1  . pregabalin (LYRICA) 200 MG capsule Take 1 capsule (200 mg total) by mouth 2 (two) times daily. 180 capsule 1  . SAVAYSA 30 MG TABS tablet TAKE 1 TABLET (30 MG TOTAL) BY MOUTH DAILY. 90 tablet 1  . sulfamethoxazole-trimethoprim (BACTRIM DS,SEPTRA DS) 800-160 MG tablet TAKE 1 TABLET BY MOUTH EVERY 12 (TWELVE) HOURS. 60 tablet 3  . thiamine ([facility] B-1) 100 MG tablet Take 1 tablet (100 mg total) by mouth daily. 90 tablet 1  . valACYclovir (VALTREX) 500 MG tablet TAKE 1 TABLET BY MOUTH EVERY DAY 90 tablet 3  . nitroGLYCERIN (NITROSTAT) 0.4 MG SL tablet Place 1 tablet (0.4 mg total) under the tongue every 5 (five) minutes as needed for chest pain. 25 tablet 2   No current facility-administered medications for this visit.     Past Medical History:  Diagnosis Date  . Carotid artery occlusion    40-60% right ICA stenosis (09/2008)  . Chronic back pain   . CLL (chronic lymphoblastic leukemia) dx 2010   Followed at [facility] q[facility], no current therapy   . Coronary artery disease 2010   s/p CABG '10, sees Dr. Percival Spanish  . Depression   . Diverticulosis   . DVT, lower extremity, recurrent ([place]) 2008, 2009   LLE, chronic anticoag since 2009  . Esophagitis   . Fibromyalgia   . Gallstones   . GERD (gastroesophageal reflux disease)   . Gout   . Gynecomastia, male   . H/O hiatal hernia 2008   surgery  . Hemorrhoids   . Hepatitis A yrs ago  . HIV infection ([place]) dx 1993  . Hypertension    . Impotence of organic origin   . Myocardial infarction ([place]) 2010    x 2  . Neuromuscular disorder (HCC)    neuropathy  . Osteoarthritis, knee    s/p B TKA  . Pneumonia mrach, may, july 2017  . Rheumatoid arthritis(714.0) dx 2010   MTX, follows with rheum  . Seasonal allergies   . Secondary syphilis 07/24/14 dx   s/p 2 wks doxy  . Status post dilation of esophageal narrowing   . Stroke ([place]) [place]   . TIA (transient ischemic  attack) 1997   mild residual L mouth droop  . Tubular adenoma of colon     Past Surgical History:  Procedure Laterality Date  . CHOLECYSTECTOMY    . COLONOSCOPY WITH PROPOFOL N/A 12/28/2012   Procedure: COLONOSCOPY WITH PROPOFOL;  Surgeon: Jerene Bears, MD;  [facility];  Service: Gastroenterology;  Laterality: N/A;  . CORONARY ARTERY BYPASS GRAFT  2010   triple bypass  . ESOPHAGOGASTRODUODENOSCOPY (EGD) WITH PROPOFOL N/A 12/28/2012   Procedure: ESOPHAGOGASTRODUODENOSCOPY (EGD) WITH PROPOFOL;  Surgeon: Jerene Bears, MD;  [facility];  Service: Gastroenterology;  Laterality: N/A;  . ESOPHAGOGASTRODUODENOSCOPY (EGD) WITH PROPOFOL N/A 03/15/2013   Procedure: ESOPHAGOGASTRODUODENOSCOPY (EGD) WITH PROPOFOL;  Surgeon: Jerene Bears, MD;  [facility];  Service: Gastroenterology;  Laterality: N/A;  . ESOPHAGOGASTRODUODENOSCOPY (EGD) WITH PROPOFOL N/A 02/07/2016   Procedure: ESOPHAGOGASTRODUODENOSCOPY (EGD) WITH PROPOFOL;  Surgeon: Jerene Bears, MD;  [facility];  Service: Gastroenterology;  Laterality: N/A;  . HARDWARE REMOVAL N/A 07/02/2012   Procedure: HARDWARE REMOVAL;  Surgeon: Elaina Hoops, MD;  Location: [facility];  Service: Neurosurgery;  Laterality: N/A;  . HIATAL HERNIA REPAIR     wrap  . INGUINAL HERNIA REPAIR Bilateral   . JOINT REPLACEMENT Left 1999  . KNEE ARTHROPLASTY  07/22/2011   Procedure: COMPUTER ASSISTED TOTAL KNEE ARTHROPLASTY;  Surgeon: Meredith Pel, MD;  Location: [facility];  Service:  Orthopedics;  Laterality: Right;  Right total knee arthroplasty  . MANDIBLE SURGERY Bilateral    tmj  . REPLACEMENT TOTAL KNEE Bilateral   . ring around testicle hernia reapir  184 and 1986   x 2  . ROTATOR CUFF REPAIR Right   . SHOULDER SURGERY Left   . SPINE SURGERY  2010   "rod and screws", "failed", lopwer spine,   . stent to heart x 1  2010  . TONSILLECTOMY    . UMBILICAL HERNIA REPAIR     x 1  . varicose vein     stripping  . VIDEO BRONCHOSCOPY Bilateral 10/16/2015   Procedure: VIDEO BRONCHOSCOPY WITHOUT FLUORO;  Surgeon: Brand Males, MD;  [facility];  Service: Cardiopulmonary;  Laterality: Bilateral;    ROS:  As stated in the HPI and negative for all other systems.  PHYSICAL EXAM BP 114/64   Pulse (!) 57   Ht 5\' 9"  (1.753 m)   Wt 197 lb 9.6 oz (89.6 kg)   BMI 29.18 kg/m   GENERAL:  Well appearing NECK:  No jugular venous distention, waveform within normal limits, carotid upstroke brisk and symmetric, no bruits, no thyromegaly LUNGS:  Clear to auscultation bilaterally CHEST:  Unremarkable HEART:  PMI not displaced or sustained,S1 and S2 within normal limits, no S3, no S4, no clicks, no rubs, brief 2 out of 6 apical and left midsternal border murmur, no diastolic murmurs ABD:  Flat, positive bowel sounds normal in frequency in pitch, no bruits, no rebound, no guarding, no midline pulsatile mass, no hepatomegaly, no splenomegaly EXT:  2 plus pulses throughout, no edema, no cyanosis no clubbing     Lab Results  Component Value Date   CHOL 130 08/14/2016   TRIG 149 08/14/2016   HDL 39 (L) 08/14/2016   LDLCALC 61 08/14/2016     ASSESSMENT AND PLAN   CAD/DYSPNEA  The patient had a negative perfusion study for reversible ischemia in 2016.   At this point I do not think the arm pain that he had one time would suggest new onset  angina but he will let me know if this recurs.  I will give him a prescription for sublingual nitroglycerin.   CAROTID  STENOSIS He had mild stenosis last year.  No further imaging is indicated.    HTN His blood pressure is normal.  No change in therapies.  TR I will follow this clinically.

## 2017-12-24 ENCOUNTER — Encounter: Payer: Self-pay | Admitting: Cardiology

## 2017-12-24 ENCOUNTER — Ambulatory Visit (INDEPENDENT_AMBULATORY_CARE_PROVIDER_SITE_OTHER): Payer: Medicare Other | Admitting: Cardiology

## 2017-12-24 VITALS — BP 114/64 | HR 57 | Ht 69.0 in | Wt 197.6 lb

## 2017-12-24 DIAGNOSIS — Z21 Asymptomatic human immunodeficiency virus [HIV] infection status: Secondary | ICD-10-CM | POA: Diagnosis not present

## 2017-12-24 DIAGNOSIS — M0609 Rheumatoid arthritis without rheumatoid factor, multiple sites: Secondary | ICD-10-CM | POA: Diagnosis not present

## 2017-12-24 DIAGNOSIS — M150 Primary generalized (osteo)arthritis: Secondary | ICD-10-CM | POA: Diagnosis not present

## 2017-12-24 DIAGNOSIS — C9110 Chronic lymphocytic leukemia of B-cell type not having achieved remission: Secondary | ICD-10-CM | POA: Diagnosis not present

## 2017-12-24 DIAGNOSIS — M1A09X0 Idiopathic chronic gout, multiple sites, without tophus (tophi): Secondary | ICD-10-CM | POA: Diagnosis not present

## 2017-12-24 DIAGNOSIS — B59 Pneumocystosis: Secondary | ICD-10-CM | POA: Diagnosis not present

## 2017-12-24 DIAGNOSIS — I10 Essential (primary) hypertension: Secondary | ICD-10-CM | POA: Diagnosis not present

## 2017-12-24 DIAGNOSIS — Z6830 Body mass index (BMI) 30.0-30.9, adult: Secondary | ICD-10-CM | POA: Diagnosis not present

## 2017-12-24 DIAGNOSIS — J849 Interstitial pulmonary disease, unspecified: Secondary | ICD-10-CM | POA: Diagnosis not present

## 2017-12-24 DIAGNOSIS — I2510 Atherosclerotic heart disease of native coronary artery without angina pectoris: Secondary | ICD-10-CM | POA: Diagnosis not present

## 2017-12-24 DIAGNOSIS — M2550 Pain in unspecified joint: Secondary | ICD-10-CM | POA: Diagnosis not present

## 2017-12-24 DIAGNOSIS — E669 Obesity, unspecified: Secondary | ICD-10-CM | POA: Diagnosis not present

## 2017-12-24 MED ORDER — NITROGLYCERIN 0.4 MG SL SUBL: 0.4000 mg | SUBLINGUAL_TABLET | SUBLINGUAL | 2 refills | Status: AC | PRN

## 2017-12-24 NOTE — Patient Instructions (Signed)
Medication Instructions:  Continue current medications  If you need a refill on your cardiac medications before your next appointment, please call your pharmacy.  Labwork: None Ordered   Testing/Procedures: None Ordered  Follow-Up: Your physician wants you to follow-up in: 1 Year. You should receive a reminder letter in the mail two months in advance. If you do not receive a letter, please call our office in [phone] to schedule your follow-up appointment.     Thank you for choosing [facility]!!       

## 2017-12-26 ENCOUNTER — Other Ambulatory Visit: Payer: Self-pay | Admitting: Cardiology

## 2017-12-30 DIAGNOSIS — M0609 Rheumatoid arthritis without rheumatoid factor, multiple sites: Secondary | ICD-10-CM | POA: Diagnosis not present

## 2018-01-04 ENCOUNTER — Encounter: Payer: Self-pay | Admitting: Internal Medicine

## 2018-01-05 ENCOUNTER — Encounter: Payer: Self-pay | Admitting: Internal Medicine

## 2018-01-05 DIAGNOSIS — Z789 Other specified health status: Secondary | ICD-10-CM

## 2018-01-07 ENCOUNTER — Other Ambulatory Visit: Payer: Medicare Other

## 2018-01-07 DIAGNOSIS — Z79899 Other long term (current) drug therapy: Secondary | ICD-10-CM

## 2018-01-07 DIAGNOSIS — B20 Human immunodeficiency virus [HIV] disease: Secondary | ICD-10-CM

## 2018-01-07 DIAGNOSIS — IMO0001 Reserved for inherently not codable concepts without codable children: Secondary | ICD-10-CM

## 2018-01-08 LAB — T-HELPER CELL (CD4) - (RCID CLINIC ONLY)
CD4 % Helper T Cell: 13 % — ABNORMAL LOW (ref 33–55)
CD4 T Cell Abs: 1220 /uL (ref 400–2700)

## 2018-01-09 LAB — COMPREHENSIVE METABOLIC PANEL
AG Ratio: 2.9 (calc) — ABNORMAL HIGH (ref 1.0–2.5)
ALT: 22 U/L (ref 9–46)
AST: 24 U/L (ref 10–35)
Albumin: 4.1 g/dL (ref 3.6–5.1)
Alkaline phosphatase (APISO): 74 U/L (ref 40–115)
BUN: 19 mg/dL (ref 7–25)
CO2: 31 mmol/L (ref 20–32)
Calcium: 8.9 mg/dL (ref 8.6–10.3)
Chloride: 104 mmol/L (ref 98–110)
Creat: 1.12 mg/dL (ref 0.70–1.25)
Globulin: 1.4 g/dL (calc) — ABNORMAL LOW (ref 1.9–3.7)
Glucose, Bld: 82 mg/dL (ref 65–99)
Potassium: 4.4 mmol/L (ref 3.5–5.3)
Sodium: 140 mmol/L (ref 135–146)
Total Bilirubin: 0.7 mg/dL (ref 0.2–1.2)
Total Protein: 5.5 g/dL — ABNORMAL LOW (ref 6.1–8.1)

## 2018-01-09 LAB — LIPID PANEL
Cholesterol: 146 mg/dL (ref <200)
HDL: 46 mg/dL (ref >40)
LDL Cholesterol (Calc): 83 mg/dL (calc)
Non-HDL Cholesterol (Calc): 100 mg/dL (calc) (ref <130)
Total CHOL/HDL Ratio: 3.2 (calc) (ref <5.0)
Triglycerides: 82 mg/dL (ref <150)

## 2018-01-09 LAB — HIV-1 RNA QUANT-NO REFLEX-BLD
HIV 1 RNA Quant: <20 copies/mL — AB
HIV-1 RNA Quant, Log: <1.3 Log copies/mL — AB

## 2018-01-09 LAB — CBC
HCT: 42.4 % (ref 38.5–50.0)
Hemoglobin: 14.3 g/dL (ref 13.2–17.1)
MCH: 32.7 pg (ref 27.0–33.0)
MCHC: 33.7 g/dL (ref 32.0–36.0)
MCV: 97 fL (ref 80.0–100.0)
MPV: 9.5 fL (ref 7.5–12.5)
Platelets: 133 10*3/uL — ABNORMAL LOW (ref 140–400)
RBC: 4.37 10*6/uL (ref 4.20–5.80)
RDW: 14.5 % (ref 11.0–15.0)
WBC: 13.5 10*3/uL — ABNORMAL HIGH (ref 3.8–10.8)

## 2018-01-14 ENCOUNTER — Encounter: Payer: Self-pay | Admitting: Internal Medicine

## 2018-01-14 ENCOUNTER — Ambulatory Visit (INDEPENDENT_AMBULATORY_CARE_PROVIDER_SITE_OTHER): Payer: Medicare Other | Admitting: Internal Medicine

## 2018-01-14 VITALS — BP 120/62 | HR 65 | Temp 98.2°F | Resp 16 | Ht 69.0 in | Wt 196.5 lb

## 2018-01-14 DIAGNOSIS — K2210 Ulcer of esophagus without bleeding: Secondary | ICD-10-CM | POA: Diagnosis not present

## 2018-01-14 DIAGNOSIS — F3341 Major depressive disorder, recurrent, in partial remission: Secondary | ICD-10-CM | POA: Diagnosis not present

## 2018-01-14 DIAGNOSIS — I2510 Atherosclerotic heart disease of native coronary artery without angina pectoris: Secondary | ICD-10-CM | POA: Diagnosis not present

## 2018-01-14 DIAGNOSIS — E1142 Type 2 diabetes mellitus with diabetic polyneuropathy: Secondary | ICD-10-CM | POA: Diagnosis not present

## 2018-01-14 DIAGNOSIS — K2100 Gastro-esophageal reflux disease with esophagitis, without bleeding: Secondary | ICD-10-CM

## 2018-01-14 DIAGNOSIS — F418 Other specified anxiety disorders: Secondary | ICD-10-CM | POA: Insufficient documentation

## 2018-01-14 DIAGNOSIS — I10 Essential (primary) hypertension: Secondary | ICD-10-CM

## 2018-01-14 DIAGNOSIS — M792 Neuralgia and neuritis, unspecified: Secondary | ICD-10-CM

## 2018-01-14 DIAGNOSIS — M0609 Rheumatoid arthritis without rheumatoid factor, multiple sites: Secondary | ICD-10-CM | POA: Diagnosis not present

## 2018-01-14 DIAGNOSIS — Z23 Encounter for immunization: Secondary | ICD-10-CM

## 2018-01-14 MED ORDER — ESCITALOPRAM OXALATE 10 MG PO TABS: 10.0000 mg | ORAL_TABLET | Freq: Every day | ORAL | 3 refills | Status: DC

## 2018-01-14 NOTE — Progress Notes (Signed)
Subjective:  Patient ID: [name], male    DOB: 03-10-1951  Age: 67 y.o. MRN: [id number]  CC: Depression and Gastroesophageal Reflux   HPI [name] presents for f/up- he complains of worsening heartburn and dysphagia but no odynophagia. He is not willing to take a PPI over concerns with drug interactions with his ARV regimen.   Patient is also here for a DM shoe order. Patient has a history of pre-ulcerative callus, peripheral neuropathy with evidence of callus formation, foot deformity and poor circulation. Presence of diabetes complications include peripheral neuropathy, peripheral vascular disease and cardiovascular disease. Patient is having problems with his shoes. Foot exam form has been completed per Medicare guidelines  Outpatient Medications Prior to Visit  Medication Sig Dispense Refill  . atorvastatin (LIPITOR) 10 MG tablet Take 1 tablet (10 mg total) by mouth at bedtime. 90 tablet 3  . darunavir (PREZISTA) 800 MG tablet TAKE 1 TABLET (800 MG TOTAL) BY MOUTH DAILY. 30 tablet 5  . elvitegravir-cobicistat-emtricitabine-tenofovir (GENVOYA) 150-150-200-10 MG TABS tablet Take 1 tablet by mouth daily with breakfast. 90 tablet 1  . folic acid (FOLVITE) 1 MG tablet Take 1 mg by mouth daily.  11  . furosemide (LASIX) 20 MG tablet Take 1 tablet (20 mg total) by mouth 2 (two) times daily. 90 tablet 3  . loratadine (CLARITIN) 10 MG tablet TAKE 1 TABLET (10 MG TOTAL) BY MOUTH DAILY. 90 tablet 3  . methotrexate (RHEUMATREX) 2.5 MG tablet Take 7.5 mg by mouth once a week. For 28 days  3  . metoprolol succinate (TOPROL-XL) 25 MG 24 hr tablet TAKE 1 TABLET BY MOUTH EVERY DAY 90 tablet 3  . predniSONE (DELTASONE) 5 MG tablet Take 5 mg by mouth daily.  1  . pregabalin (LYRICA) 200 MG capsule Take 1 capsule (200 mg total) by mouth 2 (two) times daily. 180 capsule 1  . SAVAYSA 30 MG TABS tablet TAKE 1 TABLET (30 MG TOTAL) BY MOUTH DAILY. 90 tablet 1  . sulfamethoxazole-trimethoprim  (BACTRIM DS,SEPTRA DS) 800-160 MG tablet TAKE 1 TABLET BY MOUTH EVERY 12 (TWELVE) HOURS. 60 tablet 3  . thiamine ([facility] B-1) 100 MG tablet Take 1 tablet (100 mg total) by mouth daily. 90 tablet 1  . valACYclovir (VALTREX) 500 MG tablet TAKE 1 TABLET BY MOUTH EVERY DAY 90 tablet 3  . escitalopram (LEXAPRO) 10 MG tablet TAKE 1 TABLET (10 MG TOTAL) BY MOUTH DAILY. 90 tablet 3  . clindamycin (CLEOCIN) 150 MG capsule TAKE 4 CAPSULES BY MOUTH ONE FOUR BEFORE APPOINTMENT  2  . Golimumab (Trezevant ARIA IV) Inject into the vein.    . nitroGLYCERIN (NITROSTAT) 0.4 MG SL tablet Place 1 tablet (0.4 mg total) under the tongue every 5 (five) minutes as needed for chest pain. (Patient not taking: Reported on 01/14/2018) 25 tablet 2  . acetaminophen (TYLENOL) 325 MG tablet Take 2 tablets (650 mg total) by mouth every 6 (six) hours as needed for mild pain (or Fever >/= 101). (Patient not taking: Reported on 01/14/2018) 30 tablet 0  . diphenhydrAMINE (BENADRYL) 25 MG tablet Take 1 tablet (25 mg total) by mouth every 6 (six) hours. 20 tablet 0  . mometasone (NASONEX) 50 MCG/ACT nasal spray Place 2 sprays into the nose daily as needed (for nasal congestion.).    [name] nitroGLYCERIN (NITRODUR - DOSED IN MG/24 HR) 0.2 mg/hr patch Place 1/4 to 1/2 of a patch over affected region. Remove and replace once daily.  Slightly alter skin placement daily  30 patch 1  . polyethylene glycol (MIRALAX / GLYCOLAX) packet Take 17 g by mouth daily as needed for mild constipation. 14 each 0   No facility-administered medications prior to visit.     ROS Review of Systems  Constitutional: Negative for chills, diaphoresis and fatigue.  HENT: Positive for trouble swallowing. Negative for voice change.   Respiratory: Negative for cough, chest tightness, shortness of breath and wheezing.   Cardiovascular: Negative for chest pain, palpitations and leg swelling.  Gastrointestinal: Negative for abdominal pain, constipation, diarrhea, nausea and  vomiting.  Endocrine: Negative for polyuria.  Genitourinary: Negative.  Negative for difficulty urinating and dysuria.  Musculoskeletal: Negative.  Negative for arthralgias.  Skin: Negative.  Negative for color change and rash.  Neurological: Negative.  Negative for dizziness, weakness, light-headedness and headaches.  Hematological: Negative for adenopathy. Does not bruise/bleed easily.  Psychiatric/Behavioral: Positive for dysphoric mood. Negative for behavioral problems, confusion, decreased concentration, sleep disturbance and suicidal ideas. The patient is nervous/anxious. The patient is not hyperactive.     Objective:  BP 120/62 (BP Location: Left Arm, Patient Position: Sitting, Cuff Size: Normal)   Pulse 65   Temp 98.2 F (36.8 C) (Oral)   Resp 16   Ht 5\' 9"  (1.753 m)   Wt 196 lb 8 oz (89.1 kg)   SpO2 94%   BMI 29.02 kg/m   BP Readings from Last 3 Encounters:  01/14/18 120/62  12/24/17 114/64  12/17/17 127/67    Wt Readings from Last 3 Encounters:  01/14/18 196 lb 8 oz (89.1 kg)  12/24/17 197 lb 9.6 oz (89.6 kg)  11/19/17 195 lb (88.5 kg)    Physical Exam  Constitutional: He is oriented to person, place, and time. No distress.  HENT:  Mouth/Throat: Oropharynx is clear and moist. No oropharyngeal exudate.  Eyes: Conjunctivae are normal. No scleral icterus.  Neck: Normal range of motion. Neck supple. No JVD present. No thyromegaly present.  Cardiovascular: Normal rate, regular rhythm and normal heart sounds. Exam reveals no gallop and no friction rub.  No murmur heard. Pulmonary/Chest: Effort normal and breath sounds normal. No respiratory distress. He has no wheezes. He has no rales.  Abdominal: Soft. Normal appearance and bowel sounds are normal. He exhibits no mass. There is no hepatosplenomegaly. There is no tenderness.  Musculoskeletal: Normal range of motion. He exhibits no edema, tenderness or deformity.  Lymphadenopathy:    He has no cervical adenopathy.    Neurological: He is alert and oriented to person, place, and time.  Skin: Skin is warm and dry. No rash noted. He is not diaphoretic.  Psychiatric: He has a normal mood and affect. His behavior is normal. Judgment and thought content normal.  Vitals reviewed.   Lab Results  Component Value Date   WBC 13.5 (H) 01/07/2018   HGB 14.3 01/07/2018   HCT 42.4 01/07/2018   PLT 133 (L) 01/07/2018   GLUCOSE 82 01/07/2018   CHOL 146 01/07/2018   TRIG 82 01/07/2018   HDL 46 01/07/2018   LDLCALC 83 01/07/2018   ALT 22 01/07/2018   AST 24 01/07/2018   NA 140 01/07/2018   K 4.4 01/07/2018   CL 104 01/07/2018   CREATININE 1.12 01/07/2018   BUN 19 01/07/2018   CO2 31 01/07/2018   TSH 1.44 07/22/2017   INR 1.61 11/19/2017   HGBA1C 5.2 12/18/2015    No results found.  Assessment & Plan:   [name] was seen today for depression and gastroesophageal reflux.  Diagnoses and  all orders for this visit:  Need for influenza vaccination -     Flu vaccine HIGH DOSE PF (Fluzone High dose)  Essential hypertension- His BP is well controlled  Erosive esophagitis- will start an H2 blocker and ask him to see GI to consider upper endoscopy to look for pathology -     Ambulatory referral to Gastroenterology -     ranitidine (ZANTAC) 300 MG tablet; Take 1 tablet (300 mg total) by mouth at bedtime.  Gastroesophageal reflux disease with esophagitis- As above -     Ambulatory referral to Gastroenterology -     ranitidine (ZANTAC) 300 MG tablet; Take 1 tablet (300 mg total) by mouth at bedtime.  Recurrent major depressive disorder, in partial remission (HCC) -     escitalopram (LEXAPRO) 10 MG tablet; Take 1 tablet (10 mg total) by mouth daily.   I have discontinued [name] "Phil"'s polyethylene glycol, acetaminophen, mometasone, and diphenhydrAMINE. I am also having him start on ranitidine. Additionally, I am having him maintain his clindamycin, predniSONE, furosemide, loratadine,  valACYclovir, atorvastatin, Golimumab (SIMPONI ARIA IV), SAVAYSA, darunavir, elvitegravir-cobicistat-emtricitabine-tenofovir, pregabalin, sulfamethoxazole-trimethoprim, methotrexate, folic acid, thiamine, nitroGLYCERIN, metoprolol succinate, and escitalopram.  Meds ordered this encounter  Medications  . escitalopram (LEXAPRO) 10 MG tablet    Sig: Take 1 tablet (10 mg total) by mouth daily.    Dispense:  90 tablet    Refill:  3  . ranitidine (ZANTAC) 300 MG tablet    Sig: Take 1 tablet (300 mg total) by mouth at bedtime.    Dispense:  90 tablet    Refill:  1     Follow-up: No follow-ups on file.  [name], MD

## 2018-01-18 ENCOUNTER — Encounter: Payer: Self-pay | Admitting: Internal Medicine

## 2018-01-18 MED ORDER — RANITIDINE HCL 300 MG PO TABS: 300.0000 mg | ORAL_TABLET | Freq: Every day | ORAL | 1 refills | Status: DC

## 2018-01-18 NOTE — Patient Instructions (Signed)
Gastroesophageal Reflux Disease, Adult Normally, food travels down the esophagus and stays in the stomach to be digested. However, when a person has gastroesophageal reflux disease (GERD), food and stomach acid move back up into the esophagus. When this happens, the esophagus becomes sore and inflamed. Over time, GERD can create small holes (ulcers) in the lining of the esophagus. What are the causes? This condition is caused by a problem with the muscle between the esophagus and the stomach (lower esophageal sphincter, or LES). Normally, the LES muscle closes after food passes through the esophagus to the stomach. When the LES is weakened or abnormal, it does not close properly, and that allows food and stomach acid to go back up into the esophagus. The LES can be weakened by certain dietary substances, medicines, and medical conditions, including:  Tobacco use.  Pregnancy.  Having a hiatal hernia.  Heavy alcohol use.  Certain foods and beverages, such as coffee, chocolate, onions, and peppermint.  What increases the risk? This condition is more likely to develop in:  People who have an increased body weight.  People who have connective tissue disorders.  People who use NSAID medicines.  What are the signs or symptoms? Symptoms of this condition include:  Heartburn.  Difficult or painful swallowing.  The feeling of having a lump in the throat.  Abitter taste in the mouth.  Bad breath.  Having a large amount of saliva.  Having an upset or bloated stomach.  Belching.  Chest pain.  Shortness of breath or wheezing.  Ongoing (chronic) cough or a night-time cough.  Wearing away of tooth enamel.  Weight loss.  Different conditions can cause chest pain. Make sure to see your health care provider if you experience chest pain. How is this diagnosed? Your health care provider will take a medical history and perform a physical exam. To determine if you have mild or severe  GERD, your health care provider may also monitor how you respond to treatment. You may also have other tests, including:  An endoscopy toexamine your stomach and esophagus with a small camera.  A test thatmeasures the acidity level in your esophagus.  A test thatmeasures how much pressure is on your esophagus.  A barium swallow or modified barium swallow to show the shape, size, and functioning of your esophagus.  How is this treated? The goal of treatment is to help relieve your symptoms and to prevent complications. Treatment for this condition may vary depending on how severe your symptoms are. Your health care provider may recommend:  Changes to your diet.  Medicine.  Surgery.  Follow these instructions at home: Diet  Follow a diet as recommended by your health care provider. This may involve avoiding foods and drinks such as: ? Coffee and tea (with or without caffeine). ? Drinks that containalcohol. ? Energy drinks and sports drinks. ? Carbonated drinks or sodas. ? Chocolate and cocoa. ? Peppermint and mint flavorings. ? Garlic and onions. ? Horseradish. ? Spicy and acidic foods, including peppers, chili powder, curry powder, vinegar, hot sauces, and barbecue sauce. ? Citrus fruit juices and citrus fruits, such as oranges, lemons, and limes. ? Tomato-based foods, such as red sauce, chili, salsa, and pizza with red sauce. ? Fried and fatty foods, such as donuts, french fries, potato chips, and high-fat dressings. ? High-fat meats, such as hot dogs and fatty cuts of red and white meats, such as rib eye steak, sausage, ham, and bacon. ? High-fat dairy items, such as whole milk,   butter, and cream cheese.  Eat small, frequent meals instead of large meals.  Avoid drinking large amounts of liquid with your meals.  Avoid eating meals during the 2-3 hours before bedtime.  Avoid lying down right after you eat.  Do not exercise right after you eat. General  instructions  Pay attention to any changes in your symptoms.  Take over-the-counter and prescription medicines only as told by your health care provider. Do not take aspirin, ibuprofen, or other NSAIDs unless your health care provider told you to do so.  Do not use any tobacco products, including cigarettes, chewing tobacco, and e-cigarettes. If you need help quitting, ask your health care provider.  Wear loose-fitting clothing. Do not wear anything tight around your waist that causes pressure on your abdomen.  Raise (elevate) the head of your bed 6 inches (15cm).  Try to reduce your stress, such as with yoga or meditation. If you need help reducing stress, ask your health care provider.  If you are overweight, reduce your weight to an amount that is healthy for you. Ask your health care provider for guidance about a safe weight loss goal.  Keep all follow-up visits as told by your health care provider. This is important. Contact a health care provider if:  You have new symptoms.  You have unexplained weight loss.  You have difficulty swallowing, or it hurts to swallow.  You have wheezing or a persistent cough.  Your symptoms do not improve with treatment.  You have a hoarse voice. Get help right away if:  You have pain in your arms, neck, jaw, teeth, or back.  You feel sweaty, dizzy, or light-headed.  You have chest pain or shortness of breath.  You vomit and your vomit looks like blood or coffee grounds.  You faint.  Your stool is bloody or black.  You cannot swallow, drink, or eat. This information is not intended to replace advice given to you by your health care provider. Make sure you discuss any questions you have with your health care provider. Document Released: 02/05/2005 Document Revised: 09/26/2015 Document Reviewed: 08/23/2014 [employer] Interactive Patient Education  2018 [employer]  

## 2018-01-21 ENCOUNTER — Encounter: Payer: Self-pay | Admitting: Allergy & Immunology

## 2018-01-21 ENCOUNTER — Ambulatory Visit (INDEPENDENT_AMBULATORY_CARE_PROVIDER_SITE_OTHER): Payer: Medicare Other | Admitting: Allergy & Immunology

## 2018-01-21 VITALS — BP 110/70 | HR 69 | Resp 16 | Ht 69.0 in | Wt 195.0 lb

## 2018-01-21 DIAGNOSIS — T781XXD Other adverse food reactions, not elsewhere classified, subsequent encounter: Secondary | ICD-10-CM | POA: Diagnosis not present

## 2018-01-21 DIAGNOSIS — Z889 Allergy status to unspecified drugs, medicaments and biological substances status: Secondary | ICD-10-CM

## 2018-01-21 DIAGNOSIS — I2510 Atherosclerotic heart disease of native coronary artery without angina pectoris: Secondary | ICD-10-CM | POA: Diagnosis not present

## 2018-01-21 DIAGNOSIS — J310 Chronic rhinitis: Secondary | ICD-10-CM | POA: Diagnosis not present

## 2018-01-21 MED ORDER — EPINEPHRINE 0.3 MG/0.3ML IJ SOAJ: 0.3000 mg | Freq: Once | INTRAMUSCULAR | 1 refills | Status: AC

## 2018-01-21 NOTE — Progress Notes (Signed)
NEW PATIENT  Date of Service/Encounter:  01/21/18  Referring provider: [name], MD   Assessment:   Adverse drug reaction (Simponi)   Adverse food reaction (peanuts, tree nuts) - with negative skin testing today  Chronic non-allergic rhinitis  HIV - on HAART with reassuring T cell counts and viral loads (followed by Dr. Johnnye [name])  Chronic lymphoblastic leukemia (stage 0, diagnosed 2007)  Chronic back pain - s/p three surgeries with good results (ambulates with a cane)   Plan/Recommendations:   1. Drug allergy - Simponi (anti-TNF) - Medication allergies are difficult to work up since there is no validated testing available for these.  - I would definitely recommend that [name] avoid medications in the same class as Simponi. - I will do some more research to see if there is a lot known about adverse drug effects to these classes of medications. - We will get [name] notes so that I can read over them and we will come up with a plan for you.  - I would like to get some lab work to look for elevated mast cell activity.  - We will call you in 1-2 weeks with the results of the testing.   2. Adverse food reaction - Continue to avoid peanuts and tree nuts. - We will check on allergy levels today.  - EpiPen is up to date.  3. Chronic rhinitis - Testing today was negative to the entire panel. - We will get blood work to confirm this. - I will avoid adding medications for now given the possibility of medication interactions.   4. Return in about 3 months (around 04/22/2018).  Subjective:   [name] is a 67 y.o. male presenting today for evaluation of  Chief Complaint  Patient presents with  . Food Intolerance    Peanuts   . Allergic Reaction  . Urticaria    [name] has a history of the following: Patient Active Problem List   Diagnosis Date Noted  . Tinea corporis 01/22/2018  . Depression with anxiety 01/14/2018  . Thiamine  deficiency neuropathy 12/29/2016  . Carotid artery disease ([place]) 11/13/2016  . Stenosis of carotid artery 11/13/2016  . Spinal stenosis of thoracolumbar region 07/02/2016  . ILD (interstitial lung disease) ([place]) 09/13/2015  . Immunocompromised state ([place]) 09/13/2015  . PCP (pneumocystis jiroveci pneumonia) ([place]) 06/18/2015  . Postinflammatory pulmonary fibrosis ([place]) 05/11/2015  . GERD (gastroesophageal reflux disease)   . Hereditary and idiopathic peripheral neuropathy 09/08/2013  . Erosive esophagitis 12/28/2012  . Allergic rhinitis, cause unspecified 04/28/2012  . Long term current use of anticoagulant therapy 02/03/2012  . Hypertension   . DVT, lower extremity, recurrent ([place])   . Gout   . Carotid artery occlusion   . Hyperlipidemia with target LDL less than 100   . HIV infection ([place]) 04/08/2011  . Arthritis, rheumatoid ([place]) 04/08/2011  . Chronic lymphoblastic leukemia ([place]) 04/08/2011  . Impotence of organic origin 04/02/2011  . CAD (coronary artery disease) of artery bypass graft 02/14/2009  . Herpes genitalis 06/22/2008    History obtained from: chart review and patient.  [name] was referred by Christopher Lima, MD.     [name] is a 67 y.o. male with a complicated past medical history of HIV+ (well controlled), CLL(dx 2007), pulmonary fibrosis, and RA for which he takes [name]. He also has a hx of esophageal strictures dilated by EGD every 1.5 yrs or so.   presenting for an evaluation of an  IV contrast. He has never reacted to contrast. But he was at [name] office for rheumatoid arthritis as well as fibromyalgia. He has been on different medications in the past, but he denies any use of the older TNF inhibitors. He was diagnosed 12 years ago and was followed by a homeopathic doctor. He was on topical creams initially and then advanced to methotrexate which did not provide any relief. [name] felt that MTX would help with the Orencia (anti-CTLA4). He was  receiving his injections in [name].    He was started on Simponi (anti-TNF) and had anaphylactic shock. They are progressively getting worse. [name] is concerned to know that he needs to know what to avoid. He remains on Orencia but has stopped the Simponi after third dose. He is unsure whether it was even helping his symptoms.   He has a history of chronic back pain, s/p three surgeries with no pain.  However, he has trouble standing up because of some muscle damage. He is happy that the pain is gone, however. He is followed by Dr. Saintclair [name].    [name] does have a history of HIV diagnosed in 1993 but he has been well controlled since that time. He is followed by Dr. Johnnye [name]. He has CLL that is quiescent and followed by Dr. Alen [name].   [name]  has reacted to peanuts in the past, including peanut oil. He reacted to fries at [employer] once and then had throat involvement with peanuts within a bread. The last time he had passing out on the way to the hospital. He did have a reaction to [employer] popcorn which said that he was peanut free, but he tolerated this without a problem. He avoids all peanuts and tree nuts.   [name] does have intermittent rhinitis symptoms. He will use medications for these intermittently.   He does have a history of a penicillin allergy with flushing, erythema, and warmth. This occurred within the last five years. Otherwise, there is no history of other atopic diseases, including stinging insect allergies or eczema. There is no significant infectious history. Vaccinations are up to date.    Past Medical History: Patient Active Problem List   Diagnosis Date Noted  . Tinea corporis 01/22/2018  . Depression with anxiety 01/14/2018  . 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 Carotid artery disease ([place]) 11/13/2016  . Stenosis of carotid artery 11/13/2016  . Spinal stenosis of thoracolumbar region 07/02/2016  . ILD (interstitial lung disease) ([place]) 09/13/2015  .  Immunocompromised state ([place]) 09/13/2015  . PCP (pneumocystis jiroveci pneumonia) ([place]) 06/18/2015  . Postinflammatory pulmonary fibrosis ([place]) 05/11/2015  . GERD (gastroesophageal reflux disease)   . Hereditary and idiopathic peripheral neuropathy 09/08/2013  . Erosive esophagitis 12/28/2012  . Allergic rhinitis, cause unspecified 04/28/2012  . Long term current use of anticoagulant therapy 02/03/2012  . Hypertension   . DVT, lower extremity, recurrent ([place])   . Gout   . Carotid artery occlusion   . Hyperlipidemia with target LDL less than 100   . HIV infection ([place]) 04/08/2011  . Arthritis, rheumatoid ([place]) 04/08/2011  . Chronic lymphoblastic leukemia ([place]) 04/08/2011  . Impotence of organic origin 04/02/2011  . CAD (coronary artery disease) of artery bypass graft 02/14/2009  . Herpes genitalis 06/22/2008    Medication List:  Allergies as of 01/21/2018      Reactions   Golimumab Anaphylaxis   Other Anaphylaxis, Hives   Pecan   Peanut-containing Drug Products Anaphylaxis, Hives, Swelling  Swelling of throat   Morphine Other (See Comments)   REACTION: severe headache   Oxycodone-acetaminophen Other (See Comments)   REACTION: headache   Penicillins Rash, Other (See Comments)   FLUSHED, RED Has patient had a PCN reaction causing immediate rash, facial/tongue/throat swelling, SOB or lightheadedness with hypotension: #  #  #  YES  #  #  #  Has patient had a PCN reaction causing severe rash involving mucus membranes or skin necrosis: No Has patient had a PCN reaction that required hospitalization No Has patient had a PCN reaction occurring within the last 10 years: No If all of the above answers are "NO", then may proceed with Cephalosporin use   Promethazine Hcl Other (See Comments)   REACTION: makes him feel drunk at higher strengths      Medication List        Accurate as of 01/21/18 11:59 PM. Always use your most recent med list.          atorvastatin 10 MG  tablet Commonly known as:  LIPITOR Take 1 tablet (10 mg total) by mouth at bedtime.   clindamycin 150 MG capsule Commonly known as:  CLEOCIN TAKE 4 CAPSULES BY MOUTH ONE FOUR BEFORE APPOINTMENT   darunavir 800 MG tablet Commonly known as:  PREZISTA TAKE 1 TABLET (800 MG TOTAL) BY MOUTH DAILY.   elvitegravir-cobicistat-emtricitabine-tenofovir 150-150-200-10 MG Tabs tablet Commonly known as:  GENVOYA Take 1 tablet by mouth daily with breakfast.   EPINEPHrine 0.3 mg/0.3 mL Soaj injection Commonly known as:  EPI-PEN Inject 0.3 mLs (0.3 mg total) into the muscle once for 1 dose. As needed for life-threatening allergic reactions   escitalopram 10 MG tablet Commonly known as:  LEXAPRO Take 1 tablet (10 mg total) by mouth daily.   folic acid 1 MG tablet Commonly known as:  FOLVITE Take 1 mg by mouth daily.   furosemide 20 MG tablet Commonly known as:  LASIX Take 1 tablet (20 mg total) by mouth 2 (two) times daily.   loratadine 10 MG tablet Commonly known as:  CLARITIN TAKE 1 TABLET (10 MG TOTAL) BY MOUTH DAILY.   methotrexate 2.5 MG tablet Commonly known as:  RHEUMATREX Take 7.5 mg by mouth once a week. For 28 days   metoprolol succinate 25 MG 24 hr tablet Commonly known as:  TOPROL-XL TAKE 1 TABLET BY MOUTH EVERY DAY   nitroGLYCERIN 0.4 MG SL tablet Commonly known as:  NITROSTAT Place 1 tablet (0.4 mg total) under the tongue every 5 (five) minutes as needed for chest pain.   ORENCIA IV Inject into the vein.   predniSONE 5 MG tablet Commonly known as:  DELTASONE Take 5 mg by mouth daily.   pregabalin 200 MG capsule Commonly known as:  LYRICA Take 1 capsule (200 mg total) by mouth 2 (two) times daily.   SAVAYSA 30 MG Tabs tablet Generic drug:  edoxaban TAKE 1 TABLET (30 MG TOTAL) BY MOUTH DAILY.   sulfamethoxazole-trimethoprim 800-160 MG tablet Commonly known as:  BACTRIM DS,SEPTRA DS TAKE 1 TABLET BY MOUTH EVERY 12 (TWELVE) HOURS.   thiamine 100 MG  tablet Take 1 tablet (100 mg total) by mouth daily.   valACYclovir 500 MG tablet Commonly known as:  VALTREX TAKE 1 TABLET BY MOUTH EVERY DAY       Birth History: non-contributory  Developmental History: non-contributory.   Past Surgical History: Past Surgical History:  Procedure Laterality Date  . CHOLECYSTECTOMY    . COLONOSCOPY WITH PROPOFOL N/A 12/28/2012  Procedure: COLONOSCOPY WITH PROPOFOL;  Surgeon: Jerene Bears, MD;  Location: [facility];  Service: Gastroenterology;  Laterality: N/A;  . CORONARY ARTERY BYPASS GRAFT  2010   triple bypass  . ESOPHAGOGASTRODUODENOSCOPY (EGD) WITH PROPOFOL N/A 12/28/2012   Procedure: ESOPHAGOGASTRODUODENOSCOPY (EGD) WITH PROPOFOL;  Surgeon: Jerene Bears, MD;  Location: [facility];  Service: Gastroenterology;  Laterality: N/A;  . ESOPHAGOGASTRODUODENOSCOPY (EGD) WITH PROPOFOL N/A 03/15/2013   Procedure: ESOPHAGOGASTRODUODENOSCOPY (EGD) WITH PROPOFOL;  Surgeon: Jerene Bears, MD;  Location: [facility];  Service: Gastroenterology;  Laterality: N/A;  . ESOPHAGOGASTRODUODENOSCOPY (EGD) WITH PROPOFOL N/A 02/07/2016   Procedure: ESOPHAGOGASTRODUODENOSCOPY (EGD) WITH PROPOFOL;  Surgeon: Jerene Bears, MD;  Location: [facility];  Service: Gastroenterology;  Laterality: N/A;  . HARDWARE REMOVAL N/A 07/02/2012   Procedure: HARDWARE REMOVAL;  Surgeon: Elaina Hoops, MD;  Location: [facility];  Service: Neurosurgery;  Laterality: N/A;  . HIATAL HERNIA REPAIR     wrap  . INGUINAL HERNIA REPAIR Bilateral   . JOINT REPLACEMENT Left 1999  . KNEE ARTHROPLASTY  07/22/2011   Procedure: COMPUTER ASSISTED TOTAL KNEE ARTHROPLASTY;  Surgeon: Meredith Pel, MD;  Location: [facility];  Service: Orthopedics;  Laterality: Right;  Right total knee arthroplasty  . MANDIBLE SURGERY Bilateral    tmj  . REPLACEMENT TOTAL KNEE Bilateral   . ring around testicle hernia reapir  184 and 1986   x 2  . ROTATOR CUFF REPAIR Right   . SHOULDER SURGERY Left   . SPINE SURGERY   2010   "rod and screws", "failed", lopwer spine,   . stent to heart x 1  2010  . TONSILLECTOMY    . UMBILICAL HERNIA REPAIR     x 1  . varicose vein     stripping  . VIDEO BRONCHOSCOPY Bilateral 10/16/2015   Procedure: VIDEO BRONCHOSCOPY WITHOUT FLUORO;  Surgeon: Brand Males, MD;  Location: [facility];  Service: Cardiopulmonary;  Laterality: Bilateral;     Family History: Family History  Problem Relation Age of Onset  . Breast cancer Mother   . Hypertension Mother   . Hyperlipidemia Mother   . Diabetes Mother   . Prostate cancer Father   . Colon polyps Father   . Hyperlipidemia Father   . Crohn's disease Paternal Aunt   . Diabetes Maternal Grandmother   . Diabetes Brother        x 3  . Heart disease Brother        x 3  . Hyperlipidemia Brother        x 3  . Alcohol abuse Daughter   . Drug abuse Daughter   . Asthma Brother   . Colon cancer Neg Hx      Social History: Rowe lives at home by himself. He lives in a house that is 69 years old. There is carpeting throughout the home. He has electric heating and central cooling. There is one dog in the home. There are no dust mite coverings on the bedding. There is no tobacco exposure in the home. He is currently retired for the past 25 years.       Review of Systems: a 14-point review of systems is pertinent for what is mentioned in HPI.  Otherwise, all other systems were negative. Constitutional: negative other than that listed in the HPI Eyes: negative other than that listed in the HPI Ears, nose, mouth, throat, and face: negative other than that listed in the HPI Respiratory: negative other than that listed in the HPI  Cardiovascular: negative other than that listed in the HPI Gastrointestinal: negative other than that listed in the HPI Genitourinary: negative other than that listed in the HPI Integument: negative other than that listed in the HPI Hematologic: negative other than that listed in the  HPI Musculoskeletal: negative other than that listed in the HPI Neurological: negative other than that listed in the HPI Allergy/Immunologic: negative other than that listed in the HPI    Objective:   Blood pressure 110/70, pulse 69, resp. rate 16, height 5\' 9"  (1.753 m), weight 195 lb (88.5 kg), SpO2 95 %. Body mass index is 28.8 kg/m.   Physical Exam:  General: Alert, interactive, in no acute distress. Pleasant male.  Eyes: No conjunctival injection bilaterally, no discharge on the right, no discharge on the left and no Horner-Trantas dots present. PERRL bilaterally. EOMI without pain. No photophobia.  Ears: Right TM pearly gray with normal light reflex, Left TM pearly gray with normal light reflex, Right TM intact without perforation and Left TM intact without perforation.  Nose/Throat: External nose within normal limits and septum midline. Turbinates edematous and pale with clear discharge. Posterior oropharynx mildly erythematous without cobblestoning in the posterior oropharynx. Tonsils 2+ without exudates.  Tongue without thrush. Neck: Supple without thyromegaly. Trachea midline. Adenopathy: shoddy bilateral anterior cervical lymphadenopathy and no enlarged lymph nodes appreciated in the occipital, axillary, epitrochlear, inguinal, or popliteal regions. Lungs: Clear to auscultation without wheezing, rhonchi or rales. No increased work of breathing. CV: Normal S1/S2. No murmurs. Capillary refill <2 seconds.  Abdomen: Nondistended, nontender. No guarding or rebound tenderness. Bowel sounds present in all fields and hyperactive  Skin: Warm and dry, without lesions or rashes. Extremities:  No clubbing, cyanosis or edema. Neuro:   Grossly intact. No focal deficits appreciated. Responsive to questions.  Diagnostic studies:   Allergy Studies:   Indoor/Outdoor Percutaneous Adult Environmental Panel: negative to the entire panel with adequate controls.  Selected Food Panel: negative  to Peanut, Cashew, Pecan, [place], [place], [place], [place] nut, Coconut and Pistachio   Allergy testing results were read and interpreted by myself, documented by clinical staff.       Salvatore Marvel, MD [facility] [place]

## 2018-01-21 NOTE — Patient Instructions (Addendum)
1. Drug allergy - Medication allergies are difficult to work up since there is no validated testing available for these.  - I would definitely recommend that [name] avoid medications in the same class as Simponi. - I will do some more research to see if there is a lot known about adverse drug effects to these classes of medications. - We will get Dr. [name] Noon notes so that I can read over them and we will come up with a plan for you.  - I would like to get some lab work to look for elevated mast cell activity.  - We will call you in 1-2 weeks with the results of the testing.   2. Adverse food reaction - Continue to avoid peanuts and tree nuts. - We will check on allergy levels today.  - EpiPen is up to date.  3. Chronic rhinitis - Testing today was negative to the entire panel. - We will get blood work to confirm this. - I will avoid adding medications for now given the possibility of medication interactions.   4. Return in about 3 months (around 04/22/2018).  Please inform us of any Emergency Department visits, hospitalizations, or changes in symptoms. Call us before going to the ED for breathing or allergy symptoms since we might be able to fit you in for a sick visit. Feel free to contact us anytime with any questions, problems, or concerns.  It was a pleasure to meet you today!  Websites that have reliable patient information: 1. American Academy of Asthma, Allergy, and Immunology: www.aaaai.org 2. Food Allergy Research and Education (FARE): foodallergy.org 3. Mothers of Asthmatics: http://www.asthmacommunitynetwork.org 4. American College of Allergy, Asthma, and Immunology: MonthlyElectricBill.co.uk   Make sure you are registered to vote! If you have moved or changed any of your contact information, you will need to get this updated before voting!

## 2018-01-22 ENCOUNTER — Ambulatory Visit (INDEPENDENT_AMBULATORY_CARE_PROVIDER_SITE_OTHER): Payer: Medicare Other | Admitting: Infectious Diseases

## 2018-01-22 ENCOUNTER — Encounter: Payer: Self-pay | Admitting: Infectious Diseases

## 2018-01-22 VITALS — BP 125/76 | HR 61 | Temp 97.5°F | Ht 69.0 in | Wt 196.0 lb

## 2018-01-22 DIAGNOSIS — B354 Tinea corporis: Secondary | ICD-10-CM | POA: Diagnosis not present

## 2018-01-22 DIAGNOSIS — Z21 Asymptomatic human immunodeficiency virus [HIV] infection status: Secondary | ICD-10-CM | POA: Diagnosis not present

## 2018-01-22 DIAGNOSIS — J849 Interstitial pulmonary disease, unspecified: Secondary | ICD-10-CM

## 2018-01-22 DIAGNOSIS — M4805 Spinal stenosis, thoracolumbar region: Secondary | ICD-10-CM | POA: Diagnosis not present

## 2018-01-22 DIAGNOSIS — Z79899 Other long term (current) drug therapy: Secondary | ICD-10-CM | POA: Diagnosis not present

## 2018-01-22 DIAGNOSIS — Z113 Encounter for screening for infections with a predominantly sexual mode of transmission: Secondary | ICD-10-CM

## 2018-01-22 DIAGNOSIS — I2510 Atherosclerotic heart disease of native coronary artery without angina pectoris: Secondary | ICD-10-CM | POA: Diagnosis not present

## 2018-01-22 DIAGNOSIS — IMO0001 Reserved for inherently not codable concepts without codable children: Secondary | ICD-10-CM

## 2018-01-22 DIAGNOSIS — C91Z0 Other lymphoid leukemia not having achieved remission: Secondary | ICD-10-CM | POA: Diagnosis not present

## 2018-01-22 MED ORDER — CLOTRIMAZOLE 1 % EX CREA: 1.0000 "application " | TOPICAL_CREAM | Freq: Two times a day (BID) | CUTANEOUS | 0 refills | Status: DC

## 2018-01-22 NOTE — Assessment & Plan Note (Signed)
Will give him a trial of topical clotrimazole

## 2018-01-22 NOTE — Assessment & Plan Note (Signed)
He has no pain. ROM limited by his arthritis.

## 2018-01-22 NOTE — Assessment & Plan Note (Signed)
Appears stable He continues to use prn O2 at night.

## 2018-01-22 NOTE — Progress Notes (Signed)
   Subjective:    Patient ID: [name], male    DOB: Feb 11, 1951, 67 y.o.   MRN: [id number]  HPI 67 yo M with hx of HIV+, CLL(dx 2007), pulmonary fibrosis, and RA for which he takes MTX. He also has a hx of esophageal strictures dilated by EGD every 1.5 yrs.  2-202-18 he underwent lumbar laminectomy.No problems. "Dr Saintclair Halsted did an outstanding job. I praise the ground he walks on".  Sees Onc yearly- told that he is doing well, (Stage 0) continue surveillance.  He was seen by Rheum and had an infusion (Golimumab) which he developed anaphylaxis to (or the nut free popcorn he was eating). He was seen in ED after he gave himself epi. He was seen by allergy yesterday and had no reactions to skin testing.  Is going to be tried on [place] He is currently on genvoya/darunavir.He has had no problems with his ART.  He is having burining and aching of his feet. His heels feel like they are "full of water". Has been seen by podiatry.  Has more energy. Getting more done.  Has burning in chest with deep breath. Gets "winded easy". Was seen by CV this summer as well.   HIV 1 RNA Quant (copies/mL)  Date Value  01/07/2018 <20 DETECTED (A)  07/09/2017 <20 NOT DETECTED  08/14/2016 <20 DETECTED (A)   CD4 T Cell Abs (/uL)  Date Value  01/07/2018 1,220  07/09/2017 1,790  08/14/2016 790    Review of Systems  Constitutional: Negative for appetite change and unexpected weight change.  Respiratory: Positive for shortness of breath.   Gastrointestinal: Negative for constipation and diarrhea.  Genitourinary: Negative for difficulty urinating.  Musculoskeletal: Positive for arthralgias.  Neurological: Positive for numbness.      Objective:   Physical Exam  Constitutional: He is oriented to person, place, and time. He appears well-developed and well-nourished.  HENT:  Mouth/Throat: No oropharyngeal exudate.  Eyes: Pupils are equal, round, and reactive to light. EOM are normal.  Neck: Normal range  of motion. Neck supple.  Cardiovascular: Normal rate, regular rhythm and normal heart sounds.  Pulmonary/Chest: Effort normal and breath sounds normal.  Abdominal: Soft. Bowel sounds are normal. He exhibits no distension. There is no tenderness.  Musculoskeletal: He exhibits no edema.       Arms: Lymphadenopathy:    He has no cervical adenopathy.  Neurological: He is alert and oriented to person, place, and time. No cranial nerve deficit.  Psychiatric: He has a normal mood and affect.      Assessment & Plan:

## 2018-01-22 NOTE — Assessment & Plan Note (Addendum)
He is doing well We discussed his wt- he will watch diet, he is unable to exercise.  We discussed his lab results.  No changei n his ART.  He has gotten flu shot and PCV 13 Will have him return to clinic in 9 months

## 2018-01-22 NOTE — Assessment & Plan Note (Signed)
apprecaite h/o f/u

## 2018-01-23 ENCOUNTER — Encounter: Payer: Self-pay | Admitting: Allergy & Immunology

## 2018-01-26 ENCOUNTER — Other Ambulatory Visit: Payer: Self-pay | Admitting: Internal Medicine

## 2018-01-26 ENCOUNTER — Telehealth: Payer: Self-pay | Admitting: *Deleted

## 2018-01-26 ENCOUNTER — Telehealth: Payer: Self-pay

## 2018-01-26 ENCOUNTER — Encounter: Payer: Self-pay | Admitting: Allergy & Immunology

## 2018-01-26 DIAGNOSIS — I82403 Acute embolism and thrombosis of unspecified deep veins of lower extremity, bilateral: Secondary | ICD-10-CM

## 2018-01-26 LAB — IGE+ALLERGENS ZONE 2(30)

## 2018-01-26 LAB — ALLERGY PANEL 18, NUT MIX GROUP
Allergen Coconut IgE: <0.1 kU/L
F020-IgE Almond: <0.1 kU/L
F202-IgE Cashew Nut: <0.1 kU/L
Hazelnut (Filbert) IgE: <0.1 kU/L
Peanut IgE: <0.1 kU/L
Pecan Nut IgE: <0.1 kU/L
Sesame Seed IgE: <0.1 kU/L

## 2018-01-26 LAB — TRYPTASE: Tryptase: 4.5 ug/L (ref 2.2–13.2)

## 2018-01-26 LAB — IGE PEANUT COMPONENT PROFILE
F352-IgE Ara h 8: <0.1 kU/L
F422-IgE Ara h 1: <0.1 kU/L
F423-IgE Ara h 2: <0.1 kU/L
F424-IgE Ara h 3: <0.1 kU/L
F427-IgE Ara h 9: <0.1 kU/L
F447-IgE Ara h 6: <0.1 kU/L

## 2018-01-26 NOTE — Telephone Encounter (Signed)
Faxed form, notes and shoe order to [facility] to [phone]

## 2018-01-26 NOTE — Addendum Note (Signed)
Addended by: Aviva Signs M on: 01/26/2018 04:26 PM   Modules accepted: Orders

## 2018-01-26 NOTE — Telephone Encounter (Signed)
Food Challenge Protocol was mailed to the patient regarding the Peanut Challenge. Form has been scanned in as well.

## 2018-01-27 ENCOUNTER — Other Ambulatory Visit: Payer: Self-pay | Admitting: *Deleted

## 2018-01-27 DIAGNOSIS — A6000 Herpesviral infection of urogenital system, unspecified: Secondary | ICD-10-CM

## 2018-01-27 MED ORDER — VALACYCLOVIR HCL 500 MG PO TABS
500.0000 mg | ORAL_TABLET | Freq: Every day | ORAL | 3 refills | Status: DC
Start: 2018-01-27 — End: 2019-02-07

## 2018-01-28 ENCOUNTER — Emergency Department (HOSPITAL_COMMUNITY)
Admission: EM | Admit: 2018-01-28 | Discharge: 2018-01-28 | Disposition: A | Discharge status: Home / Self Care | Payer: Medicare Other | Attending: Emergency Medicine | Admitting: Emergency Medicine

## 2018-01-28 ENCOUNTER — Encounter (HOSPITAL_COMMUNITY): Payer: Self-pay | Admitting: Emergency Medicine

## 2018-01-28 DIAGNOSIS — Z8673 Personal history of transient ischemic attack (TIA), and cerebral infarction without residual deficits: Secondary | ICD-10-CM | POA: Insufficient documentation

## 2018-01-28 DIAGNOSIS — R0789 Other chest pain: Secondary | ICD-10-CM | POA: Diagnosis not present

## 2018-01-28 DIAGNOSIS — Z79899 Other long term (current) drug therapy: Secondary | ICD-10-CM | POA: Diagnosis not present

## 2018-01-28 DIAGNOSIS — Z856 Personal history of leukemia: Secondary | ICD-10-CM | POA: Insufficient documentation

## 2018-01-28 DIAGNOSIS — I10 Essential (primary) hypertension: Secondary | ICD-10-CM | POA: Diagnosis not present

## 2018-01-28 DIAGNOSIS — R079 Chest pain, unspecified: Secondary | ICD-10-CM | POA: Diagnosis not present

## 2018-01-28 DIAGNOSIS — I252 Old myocardial infarction: Secondary | ICD-10-CM | POA: Diagnosis not present

## 2018-01-28 DIAGNOSIS — M069 Rheumatoid arthritis, unspecified: Secondary | ICD-10-CM | POA: Insufficient documentation

## 2018-01-28 DIAGNOSIS — T50995D Adverse effect of other drugs, medicaments and biological substances, subsequent encounter: Secondary | ICD-10-CM | POA: Diagnosis not present

## 2018-01-28 DIAGNOSIS — B20 Human immunodeficiency virus [HIV] disease: Secondary | ICD-10-CM | POA: Diagnosis not present

## 2018-01-28 DIAGNOSIS — R0902 Hypoxemia: Secondary | ICD-10-CM | POA: Diagnosis not present

## 2018-01-28 DIAGNOSIS — L299 Pruritus, unspecified: Secondary | ICD-10-CM | POA: Diagnosis not present

## 2018-01-28 DIAGNOSIS — T7840XA Allergy, unspecified, initial encounter: Secondary | ICD-10-CM | POA: Insufficient documentation

## 2018-01-28 DIAGNOSIS — R0602 Shortness of breath: Secondary | ICD-10-CM | POA: Diagnosis not present

## 2018-01-28 DIAGNOSIS — M0609 Rheumatoid arthritis without rheumatoid factor, multiple sites: Secondary | ICD-10-CM | POA: Diagnosis not present

## 2018-01-28 MED ORDER — METHYLPREDNISOLONE SODIUM SUCC 125 MG IJ SOLR
125.0000 mg | Freq: Once | INTRAMUSCULAR | Status: AC
  Administered 2018-01-28: 125 mg via INTRAVENOUS
  Filled 2018-01-28: qty 2

## 2018-01-28 MED ORDER — SODIUM CHLORIDE 0.9 % IV BOLUS
1000.0000 mL | Freq: Once | INTRAVENOUS | Status: AC
  Administered 2018-01-28: 1000 mL via INTRAVENOUS

## 2018-01-28 NOTE — ED Notes (Signed)
Patient verbalizes understanding of discharge instructions. Opportunity for questioning and answers were provided. 

## 2018-01-28 NOTE — ED Triage Notes (Signed)
Pt arrives via EMS from [facility] center where he was recieving a transfusion of Imuran. Pt reports chest tightness, SOB. Epi and benadryl given at infusion center. EMS gave 324 ASA and nitro. CP free after nitro

## 2018-01-28 NOTE — ED Provider Notes (Signed)
[facility] EMERGENCY DEPARTMENT Provider Note   CSN: [id number] Arrival date & time: 01/28/18  1052     History   Chief Complaint Chief Complaint  Patient presents with  . Allergic Reaction    HPI [name] is a 67 y.o. male.  The history is provided by the patient. No language interpreter was used.  Allergic Reaction  Presenting symptoms: difficulty breathing and itching   Severity:  Moderate Prior allergic episodes:  Allergies to medications Context: medications   Relieved by:  Nothing Worsened by:  Nothing Ineffective treatments:  Antihistamines and epinephrine Pt was at his Rheumatologist receiving Orencia infusion and developed an allergic reaction.  Pt was given benadryl and epi.   Pt complainsed of chest tightness and EMS gave him asa and nitro.  Pt reports he feels much better now.   Past Medical History:  Diagnosis Date  . Carotid artery occlusion    40-60% right ICA stenosis (09/2008)  . Chronic back pain   . CLL (chronic lymphoblastic leukemia) dx 2010   Followed at [facility] q42mo, no current therapy   . Coronary artery disease 2010   s/p CABG '10, sees Dr. Percival Spanish  . Depression   . Diverticulosis   . DVT, lower extremity, recurrent ([place]) 2008, 2009   LLE, chronic anticoag since 2009  . Esophagitis   . Fibromyalgia   . Gallstones   . GERD (gastroesophageal reflux disease)   . Gout   . Gynecomastia, male   . H/O hiatal hernia 2008   surgery  . Hemorrhoids   . Hepatitis A yrs ago  . HIV infection ([place]) dx 1993  . Hypertension   . Impotence of organic origin   . Myocardial infarction ([place]) 2010    x 2  . Neuromuscular disorder (HCC)    neuropathy  . Osteoarthritis, knee    s/p B TKA  . Pneumonia mrach, may, july 2017  . Rheumatoid arthritis(714.0) dx 2010   MTX, follows with rheum  . Seasonal allergies   . Secondary syphilis 07/24/14 dx   s/p 2 wks doxy  . Status post dilation of esophageal narrowing   . Stroke ([place]) [place]   . TIA (transient ischemic attack) 1997   mild residual L mouth droop  . Tubular adenoma of colon     Patient Active Problem List   Diagnosis Date Noted  . Tinea corporis 01/22/2018  . Depression with anxiety 01/14/2018  . 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 Carotid artery disease ([place]) 11/13/2016  . Stenosis of carotid artery 11/13/2016  . Spinal stenosis of thoracolumbar region 07/02/2016  . ILD (interstitial lung disease) ([place]) 09/13/2015  . Immunocompromised state ([place]) 09/13/2015  . PCP (pneumocystis jiroveci pneumonia) ([place]) 06/18/2015  . Postinflammatory pulmonary fibrosis ([place]) 05/11/2015  . GERD (gastroesophageal reflux disease)   . Hereditary and idiopathic peripheral neuropathy 09/08/2013  . Erosive esophagitis 12/28/2012  . Allergic rhinitis, cause unspecified 04/28/2012  . Long term current use of anticoagulant therapy 02/03/2012  . Hypertension   . DVT, lower extremity, recurrent ([place])   . Gout   . Carotid artery occlusion   . Hyperlipidemia with target LDL less than 100   . HIV infection ([place]) 04/08/2011  . Arthritis, rheumatoid ([place]) 04/08/2011  . Chronic lymphoblastic leukemia ([place]) 04/08/2011  . Impotence of organic origin 04/02/2011  . CAD (coronary artery disease) of artery bypass graft 02/14/2009  . Herpes genitalis 06/22/2008    Past Surgical History:  Procedure Laterality Date  .  CHOLECYSTECTOMY    . COLONOSCOPY WITH PROPOFOL N/A 12/28/2012   Procedure: COLONOSCOPY WITH PROPOFOL;  Surgeon: Jerene Bears, MD;  Location: [facility];  Service: Gastroenterology;  Laterality: N/A;  . CORONARY ARTERY BYPASS GRAFT  2010   triple bypass  . ESOPHAGOGASTRODUODENOSCOPY (EGD) WITH PROPOFOL N/A 12/28/2012   Procedure: ESOPHAGOGASTRODUODENOSCOPY (EGD) WITH PROPOFOL;  Surgeon: Jerene Bears, MD;  [facility];  Service: Gastroenterology;  Laterality: N/A;  . ESOPHAGOGASTRODUODENOSCOPY (EGD) WITH PROPOFOL N/A 03/15/2013   Procedure:  ESOPHAGOGASTRODUODENOSCOPY (EGD) WITH PROPOFOL;  Surgeon: Jerene Bears, MD;  [facility];  Service: Gastroenterology;  Laterality: N/A;  . ESOPHAGOGASTRODUODENOSCOPY (EGD) WITH PROPOFOL N/A 02/07/2016   Procedure: ESOPHAGOGASTRODUODENOSCOPY (EGD) WITH PROPOFOL;  Surgeon: Jerene Bears, MD;  [facility];  Service: Gastroenterology;  Laterality: N/A;  . HARDWARE REMOVAL N/A 07/02/2012   Procedure: HARDWARE REMOVAL;  Surgeon: Elaina Hoops, MD;  Location: [facility];  Service: Neurosurgery;  Laterality: N/A;  . HIATAL HERNIA REPAIR     wrap  . INGUINAL HERNIA REPAIR Bilateral   . JOINT REPLACEMENT Left 1999  . KNEE ARTHROPLASTY  07/22/2011   Procedure: COMPUTER ASSISTED TOTAL KNEE ARTHROPLASTY;  Surgeon: Meredith Pel, MD;  Location: [facility];  Service: Orthopedics;  Laterality: Right;  Right total knee arthroplasty  . MANDIBLE SURGERY Bilateral    tmj  . REPLACEMENT TOTAL KNEE Bilateral   . ring around testicle hernia reapir  184 and 1986   x 2  . ROTATOR CUFF REPAIR Right   . SHOULDER SURGERY Left   . SPINE SURGERY  2010   "rod and screws", "failed", lopwer spine,   . stent to heart x 1  2010  . TONSILLECTOMY    . UMBILICAL HERNIA REPAIR     x 1  . varicose vein     stripping  . VIDEO BRONCHOSCOPY Bilateral 10/16/2015   Procedure: VIDEO BRONCHOSCOPY WITHOUT FLUORO;  Surgeon: Brand Males, MD;  [facility];  Service: Cardiopulmonary;  Laterality: Bilateral;        Home Medications    Prior to Admission medications   Medication Sig Start Date End Date Taking? Authorizing Provider  Abatacept (ORENCIA IV) Inject into the vein.    [provider]  atorvastatin (LIPITOR) 10 MG tablet Take 1 tablet (10 mg total) by mouth at bedtime. 04/28/17   [name], MD  clindamycin (CLEOCIN) 150 MG capsule TAKE 4 CAPSULES BY MOUTH ONE FOUR BEFORE APPOINTMENT 09/25/16   [provider]  clotrimazole (LOTRIMIN) 1 % cream Apply 1 application  topically 2 (two) times daily. 01/22/18   Campbell Riches, MD  darunavir (PREZISTA) 800 MG tablet TAKE 1 TABLET (800 MG TOTAL) BY MOUTH DAILY. 09/30/17   Campbell Riches, MD  elvitegravir-cobicistat-emtricitabine-tenofovir (GENVOYA) 150-150-200-10 MG TABS tablet Take 1 tablet by mouth daily with breakfast. 09/30/17   Campbell Riches, MD  escitalopram (LEXAPRO) 10 MG tablet Take 1 tablet (10 mg total) by mouth daily. 01/14/18   [name], MD  folic acid (FOLVITE) 1 MG tablet Take 1 mg by mouth daily. 09/17/17   [provider]  furosemide (LASIX) 20 MG tablet Take 1 tablet (20 mg total) by mouth 2 (two) times daily. 02/18/17   [name], MD  loratadine (CLARITIN) 10 MG tablet TAKE 1 TABLET (10 MG TOTAL) BY MOUTH DAILY. 01/27/18   [name], MD  methotrexate (RHEUMATREX) 2.5 MG tablet Take 7.5 mg by mouth once a week. For 28 days  10/15/17   [provider]  metoprolol succinate (TOPROL-XL) 25 MG 24 hr tablet TAKE 1 TABLET BY MOUTH EVERY DAY 12/28/17   [name], MD  nitroGLYCERIN (NITROSTAT) 0.4 MG SL tablet Place 1 tablet (0.4 mg total) under the tongue every 5 (five) minutes as needed for chest pain. 12/24/17 03/24/18  [name], MD  predniSONE (DELTASONE) 5 MG tablet Take 5 mg by mouth daily. 11/02/16   [provider]  pregabalin (LYRICA) 200 MG capsule Take 1 capsule (200 mg total) by mouth 2 (two) times daily. 11/02/17   [name], MD  SAVAYSA 30 MG TABS tablet TAKE 1 TABLET (30 MG TOTAL) BY MOUTH DAILY. 01/27/18   [name], MD  sulfamethoxazole-trimethoprim (BACTRIM DS,SEPTRA DS) 800-160 MG tablet TAKE 1 TABLET BY MOUTH EVERY 12 (TWELVE) HOURS. 11/06/17   [name], MD  thiamine ([facility] B-1) 100 MG tablet Take 1 tablet (100 mg total) by mouth daily. 12/14/17   [name], MD  valACYclovir (VALTREX) 500 MG tablet Take 1 tablet (500 mg total) by mouth daily. 01/27/18   Campbell Riches, MD    Family History Family History    Problem Relation Age of Onset  . Breast cancer Mother   . Hypertension Mother   . Hyperlipidemia Mother   . Diabetes Mother   . Prostate cancer Father   . Colon polyps Father   . Hyperlipidemia Father   . Crohn's disease Paternal Aunt   . Diabetes Maternal Grandmother   . Diabetes Brother        x 3  . Heart disease Brother        x 3  . Hyperlipidemia Brother        x 3  . Alcohol abuse Daughter   . Drug abuse Daughter   . Asthma Brother   . Colon cancer Neg Hx     Social History Social History   Tobacco Use  . Smoking status: Never Smoker  . Smokeless tobacco: Never Used  . Tobacco comment: occ wine  Substance Use Topics  . Alcohol use: Yes    Comment: occasional wine- 1-2 per week  . Drug use: No     Allergies   Golimumab; Other; Peanut-containing drug products; Morphine; Oxycodone-acetaminophen; Penicillins; and Promethazine hcl   Review of Systems Review of Systems  Skin: Positive for itching.  All other systems reviewed and are negative.    Physical Exam Updated Vital Signs BP 119/65   Pulse 63   Temp (!) 97.5 F (36.4 C) (Oral)   Resp 13   Ht 5\' 9"  (1.753 m)   Wt 88.9 kg   SpO2 97%   BMI 28.94 kg/m   Physical Exam  Constitutional: He appears well-developed and well-nourished.  HENT:  Head: Normocephalic and atraumatic.  Eyes: Conjunctivae are normal.  Neck: Neck supple.  Cardiovascular: Normal rate and regular rhythm.  No murmur heard. Pulmonary/Chest: Effort normal and breath sounds normal. No respiratory distress.  Abdominal: Soft. There is no tenderness.  Musculoskeletal: He exhibits no edema.  Neurological: He is alert.  Skin: Skin is warm and dry.  Psychiatric: He has a normal mood and affect.  Nursing note and vitals reviewed.    ED Treatments / Results  Labs (all labs ordered are listed, but only abnormal results are displayed) Labs Reviewed - No data to display  EKG None  Radiology No results  found.  Procedures Procedures (including critical care time)  Medications Ordered in ED Medications  methylPREDNISolone sodium succinate (SOLU-MEDROL) 125 mg/2 mL injection 125 mg (has no administration in time range)     Initial Impression / Assessment and Plan / ED Course  I have reviewed the triage vital signs and the nursing notes.  Pertinent labs & imaging results that were available during my care of the patient were reviewed by me and considered in my medical decision making (see chart for details).     MDM   Pt reports he feels much better after medications.  Pt observed x 4 hours.  Pt advised to continue Benadryl. Pt advised to follow p with his Rheumatologist for further.different treatment   Final Clinical Impressions(s) / ED Diagnoses   Final diagnoses:  Allergic reaction, initial encounter    ED Discharge Orders    None    An After Visit Summary was printed and given to the patient.    Sidney Ace 01/29/18 2902    Jola Schmidt, MD 01/30/18 2055

## 2018-01-28 NOTE — ED Notes (Signed)
EDP made aware of pt BP 

## 2018-01-28 NOTE — Discharge Instructions (Signed)
Continue benadryl 25 mg every 4 hours.  Discuss your medications with your rheumatologist

## 2018-02-09 ENCOUNTER — Telehealth: Payer: Self-pay | Admitting: Internal Medicine

## 2018-02-09 ENCOUNTER — Other Ambulatory Visit: Payer: Self-pay | Admitting: *Deleted

## 2018-02-09 DIAGNOSIS — M792 Neuralgia and neuritis, unspecified: Secondary | ICD-10-CM

## 2018-02-09 DIAGNOSIS — B20 Human immunodeficiency virus [HIV] disease: Secondary | ICD-10-CM

## 2018-02-09 MED ORDER — DARUNAVIR ETHANOLATE 800 MG PO TABS: ORAL_TABLET | ORAL | 1 refills | Status: DC

## 2018-02-09 MED ORDER — ELVITEG-COBIC-EMTRICIT-TENOFAF 150-150-200-10 MG PO TABS: 1.0000 | ORAL_TABLET | Freq: Every day | ORAL | 1 refills | Status: DC

## 2018-02-09 NOTE — Telephone Encounter (Signed)
Copied from [place] [id number]. Topic: Quick Communication - See Telephone Encounter >> Feb 09, 2018  2:25 PM Conception Chancy, NT wrote:  CRM for notification. See Telephone encounter for: 02/09/18.  [facility] Mercury [facility] [place] and is requesting a refill on pregabalin (LYRICA) 200 MG capsule for the patient. She states this was originally sent to a local pharmacy but she can not transfer it since it was never filled there.   [id number] [place] [place] - [address] AT [facility] [address] [place] [address] Phone: [phone] Fax: [phone]

## 2018-02-09 NOTE — Telephone Encounter (Signed)
Check [place] registry last filled 11/17/2017. Md is out of the office this week. pls advise.Marland KitchenJohny Chess

## 2018-02-09 NOTE — Telephone Encounter (Signed)
Copied from [place] [phone]. Topic: Quick Communication - Rx Refill/Question >> Feb 09, 2018 10:54 AM Lennox Solders wrote: Medication:furosemide 20 mg #30 w/refills Has the patient contacted their pharmacy? No (Agent: If no, request that the patient contact the pharmacy for the refill.) (Preferred Pharmacy (with phone number or street name[facility] Agent: Please be advised that RX refills may take up to 3 business days. We ask that you follow-up with your pharmacy.

## 2018-02-10 MED ORDER — FUROSEMIDE 20 MG PO TABS: 20.0000 mg | ORAL_TABLET | Freq: Two times a day (BID) | ORAL | 3 refills | Status: DC

## 2018-02-10 MED ORDER — PREGABALIN 200 MG PO CAPS: 200.0000 mg | ORAL_CAPSULE | Freq: Two times a day (BID) | ORAL | 1 refills | Status: DC

## 2018-02-10 NOTE — Telephone Encounter (Signed)
erx sent per request.  

## 2018-02-10 NOTE — Telephone Encounter (Signed)
Done. Thx.

## 2018-02-11 ENCOUNTER — Encounter: Payer: Self-pay | Admitting: Physician Assistant

## 2018-02-11 ENCOUNTER — Ambulatory Visit (INDEPENDENT_AMBULATORY_CARE_PROVIDER_SITE_OTHER): Payer: Medicare Other | Admitting: Physician Assistant

## 2018-02-11 ENCOUNTER — Telehealth: Payer: Self-pay | Admitting: *Deleted

## 2018-02-11 VITALS — BP 112/58 | HR 72 | Ht 69.0 in | Wt 197.0 lb

## 2018-02-11 DIAGNOSIS — B59 Pneumocystosis: Secondary | ICD-10-CM | POA: Diagnosis not present

## 2018-02-11 DIAGNOSIS — K219 Gastro-esophageal reflux disease without esophagitis: Secondary | ICD-10-CM

## 2018-02-11 DIAGNOSIS — M150 Primary generalized (osteo)arthritis: Secondary | ICD-10-CM | POA: Diagnosis not present

## 2018-02-11 DIAGNOSIS — R1310 Dysphagia, unspecified: Secondary | ICD-10-CM | POA: Diagnosis not present

## 2018-02-11 DIAGNOSIS — C9110 Chronic lymphocytic leukemia of B-cell type not having achieved remission: Secondary | ICD-10-CM | POA: Diagnosis not present

## 2018-02-11 DIAGNOSIS — M1A09X0 Idiopathic chronic gout, multiple sites, without tophus (tophi): Secondary | ICD-10-CM | POA: Diagnosis not present

## 2018-02-11 DIAGNOSIS — E669 Obesity, unspecified: Secondary | ICD-10-CM | POA: Diagnosis not present

## 2018-02-11 DIAGNOSIS — M0609 Rheumatoid arthritis without rheumatoid factor, multiple sites: Secondary | ICD-10-CM | POA: Diagnosis not present

## 2018-02-11 DIAGNOSIS — I2510 Atherosclerotic heart disease of native coronary artery without angina pectoris: Secondary | ICD-10-CM

## 2018-02-11 DIAGNOSIS — Z21 Asymptomatic human immunodeficiency virus [HIV] infection status: Secondary | ICD-10-CM | POA: Diagnosis not present

## 2018-02-11 DIAGNOSIS — R1319 Other dysphagia: Secondary | ICD-10-CM

## 2018-02-11 DIAGNOSIS — M2550 Pain in unspecified joint: Secondary | ICD-10-CM | POA: Diagnosis not present

## 2018-02-11 DIAGNOSIS — Z6830 Body mass index (BMI) 30.0-30.9, adult: Secondary | ICD-10-CM | POA: Diagnosis not present

## 2018-02-11 DIAGNOSIS — J849 Interstitial pulmonary disease, unspecified: Secondary | ICD-10-CM | POA: Diagnosis not present

## 2018-02-11 MED ORDER — FAMOTIDINE 40 MG PO TABS: ORAL_TABLET | ORAL | 3 refills | Status: DC

## 2018-02-11 NOTE — Patient Instructions (Addendum)
We have sent the following medications to your pharmacy for you to pick up at your convenience: [facility] [place], [place].  1. Pepcid 40 mg  We will call you when we get word from Dr. [name] with the [facility] clearance instructions.  You have been scheduled for an endoscopy. Please follow written instructions given to you at your visit today. If you use inhalers (even only as needed), please bring them with you on the day of your procedure.  Normal BMI (Body Mass Index- based on height and weight) is between 23 and 30. Your BMI today is Body mass index is 29.09 kg/m. [name] Please consider follow up  regarding your BMI with your Primary Care Provider.

## 2018-02-11 NOTE — Telephone Encounter (Signed)
Called the patient to advise I spoke to [place] in Dr. [name] abscence. She reviewed the chart and checked with a physician and got permission for the patient to hold the [facility] on Saturday, Sunday and Monday morning.  I told him Dr. Hilarie Fredrickson will tell him when he can restart the [facility]  The patient verbalized understanding the instructions.

## 2018-02-11 NOTE — Progress Notes (Addendum)
Subjective:    Patient ID: [name], male    DOB: [birth year], 68 y.o.   MRN: [id number]  HPI [name] is a pleasant 67 year old white male known to Dr. Hilarie Fredrickson who was last seen in the office with complaints of dysphasia.  He underwent EGD in September 2017 which was negative empiric balloon dilation was done from 15 to 18 mm. Colonoscopy was done in August 2014 with finding of 5 mm sessile polyp in the transverse colon and moderate diverticulosis, he also had internal hemorrhoids. Patient has multiple medical issues, including HIV for which she is on [place], [place].  Also with rheumatoid arthritis.  Tells me that he recently had to anaphylactic reactions with Orencia and Simponi. He has chronic peripheral neuropathy, visual lung disease/pulmonary fibrosis, history of recurrent DVTs for which she is anticoagulated with Suvaysa ,also hypertension , coronary artery disease, and chronic lymphoblastic leukemia. He says that esophageal dilations have helped quite a bit in the past. He has not been on any chronic H2 blocker or PPI.  He says he was told at one time that the PPIs interfered with absorption of his HIV meds but this was a few years back and he is currently on different treatment. He says his current symptoms have been present over the past 5 months with a burning type pain when foods hit a certain spot in his esophagus.  This is quite uncomfortable until the food eventually goes down.  Not having episodes of regurgitation and denies heartburn.  He has not had any recent changes in meds.  Appetite has been fine weight has been stable.  He is on a low-dose of prednisone chronically as well 10 mg.  Review of Systems Pertinent positive and negative review of systems were noted in the above HPI section.  All other review of systems was otherwise negative.  Outpatient Encounter Medications as of 02/11/2018  Medication Sig  . Abatacept (ORENCIA IV) Inject into the vein.  [name]  atorvastatin (LIPITOR) 10 MG tablet Take 1 tablet (10 mg total) by mouth at bedtime.  . clindamycin (CLEOCIN) 150 MG capsule TAKE 4 CAPSULES BY MOUTH ONE FOUR BEFORE APPOINTMENT  . clotrimazole (LOTRIMIN) 1 % cream Apply 1 application topically 2 (two) times daily.  . darunavir (PREZISTA) 800 MG tablet TAKE 1 TABLET (800 MG TOTAL) BY MOUTH DAILY.  [name] elvitegravir-cobicistat-emtricitabine-tenofovir (GENVOYA) 150-150-200-10 MG TABS tablet Take 1 tablet by mouth daily with breakfast.  . escitalopram (LEXAPRO) 10 MG tablet Take 1 tablet (10 mg total) by mouth daily.  . folic acid (FOLVITE) 1 MG tablet Take 1 mg by mouth daily.  . furosemide (LASIX) 20 MG tablet Take 1 tablet (20 mg total) by mouth 2 (two) times daily.  [name] loratadine (CLARITIN) 10 MG tablet TAKE 1 TABLET (10 MG TOTAL) BY MOUTH DAILY.  . methotrexate (RHEUMATREX) 2.5 MG tablet Take 7.5 mg by mouth once a week. For 28 days  . metoprolol succinate (TOPROL-XL) 25 MG 24 hr tablet TAKE 1 TABLET BY MOUTH EVERY DAY  . nitroGLYCERIN (NITROSTAT) 0.4 MG SL tablet Place 1 tablet (0.4 mg total) under the tongue every 5 (five) minutes as needed for chest pain.  . predniSONE (DELTASONE) 5 MG tablet Take 5 mg by mouth daily.  . pregabalin (LYRICA) 200 MG capsule Take 1 capsule (200 mg total) by mouth 2 (two) times daily.  [name] SAVAYSA 30 MG TABS tablet TAKE 1 TABLET (30 MG TOTAL) BY MOUTH DAILY.  [name] sulfamethoxazole-trimethoprim (BACTRIM DS,SEPTRA DS) 800-160  MG tablet TAKE 1 TABLET BY MOUTH EVERY 12 (TWELVE) HOURS.  [name] thiamine ([facility]S B-1) 100 MG tablet Take 1 tablet (100 mg total) by mouth daily.  . valACYclovir (VALTREX) 500 MG tablet Take 1 tablet (500 mg total) by mouth daily.  . famotidine (PEPCID) 40 MG tablet Take 1 tablet by mouth twice daily for 2 months.   No facility-administered encounter medications on file as of 02/11/2018.    Allergies  Allergen Reactions  . Golimumab Anaphylaxis  . Other Anaphylaxis and Hives    Pecan  .  Peanut-Containing Drug Products Anaphylaxis, Hives and Swelling    Swelling of throat  . Morphine Other (See Comments)    REACTION: severe headache  . Oxycodone-Acetaminophen Other (See Comments)    REACTION: headache  . Penicillins Rash and Other (See Comments)    FLUSHED, RED  Has patient had a PCN reaction causing immediate rash, facial/tongue/throat swelling, SOB or lightheadedness with hypotension: #  #  #  YES  #  #  #  Has patient had a PCN reaction causing severe rash involving mucus membranes or skin necrosis: No Has patient had a PCN reaction that required hospitalization No Has patient had a PCN reaction occurring within the last 10 years: No If all of the above answers are "NO", then may proceed with Cephalosporin use  . Promethazine Hcl Other (See Comments)    REACTION: makes him feel drunk at higher strengths   Patient Active Problem List   Diagnosis Date Noted  . Tinea corporis 01/22/2018  . Depression with anxiety 01/14/2018  . 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 Carotid artery disease ([place]) 11/13/2016  . Stenosis of carotid artery 11/13/2016  . Spinal stenosis of thoracolumbar region 07/02/2016  . ILD (interstitial lung disease) ([place]) 09/13/2015  . Immunocompromised state ([place]) 09/13/2015  . PCP (pneumocystis jiroveci pneumonia) ([place]) 06/18/2015  . Postinflammatory pulmonary fibrosis ([place]) 05/11/2015  . GERD (gastroesophageal reflux disease)   . Hereditary and idiopathic peripheral neuropathy 09/08/2013  . Erosive esophagitis 12/28/2012  . Allergic rhinitis, cause unspecified 04/28/2012  . Long term current use of anticoagulant therapy 02/03/2012  . Hypertension   . DVT, lower extremity, recurrent ([place])   . Gout   . Carotid artery occlusion   . Hyperlipidemia with target LDL less than 100   . HIV infection ([place]) 04/08/2011  . Arthritis, rheumatoid ([place]) 04/08/2011  . Chronic lymphoblastic leukemia 04/08/2011  . Impotence of organic origin 04/02/2011   . CAD (coronary artery disease) of artery bypass graft 02/14/2009  . Herpes genitalis 06/22/2008   Social History   Socioeconomic History  . Marital status: Widowed    Spouse name: Not on file  . Number of children: 3  . Years of education: Not on file  . Highest education level: Not on file  Occupational History  . Occupation: retired  Scientific laboratory technician  . Financial resource strain: Not on file  . Food insecurity:    Worry: Not on file    Inability: Not on file  . Transportation needs:    Medical: Not on file    Non-medical: Not on file  Tobacco Use  . Smoking status: Never Smoker  . Smokeless tobacco: Never Used  . Tobacco comment: occ wine  Substance and Sexual Activity  . Alcohol use: Yes    Comment: occasional wine- 1-2 per week  . Drug use: No  . Sexual activity: Not Currently    Comment: declined condoms  Lifestyle  . Physical activity:  Days per week: Not on file    Minutes per session: Not on file  . Stress: Not on file  Relationships  . Social connections:    Talks on phone: Not on file    Gets together: Not on file    Attends religious service: Not on file    Active member of club or organization: Not on file    Attends meetings of clubs or organizations: Not on file    Relationship status: Not on file  . Intimate partner violence:    Fear of current or ex partner: Not on file    Emotionally abused: Not on file    Physically abused: Not on file    Forced sexual activity: Not on file  Other Topics Concern  . Not on file  Social History Narrative  . Not on file    [name]'s family history includes Alcohol abuse in his daughter; Asthma in his brother; Breast cancer in his mother; Colon polyps in his father; Crohn's disease in his paternal aunt; Diabetes in his brother, maternal grandmother, and mother; Drug abuse in his daughter; Heart disease in his brother; Hyperlipidemia in his brother, father, and mother; Hypertension in his mother; Prostate cancer  in his father.      Objective:    Vitals:   02/11/18 1415  BP: (!) 112/58  Pulse: 72    Physical Exam; well-developed older white male in no acute distress, pleasant blood pressure 112/58 pulse 72, BMI 29.0.  HEENT; nontraumatic normocephalic EOMI PERRLA sclera anicteric buccal mucosa moist, Cardiovascular; regular rate and rhythm with S1-S2, Pulmonary ;decreased breath sounds bilaterally, Abdomen; soft, nontender nondistended bowel sounds active no palpable mass or hepatosplenomegaly, Rectal ;exam not done, Extremities; no clubbing cyanosis or edema skin warm dry, Neuro psych; alert and oriented, grossly nonfocal mood and affect appropriate       Assessment & Plan:   #31 66 year old white male with dysphasia and odynophagia x5 months.  Last EGD in 2017- for similar complaints but empiric dilation was done with improvement in symptoms. Certainly patient is at higher risk for infectious esophagitis, and that needs to be considered. Rule out ulcerative esophagitis, peptic stricture.  Rule out malignancy  #2 interstitial lung disease/pulmonary fibrosis-no current oxygen use HIV on treatment #3 Rheumatoid arthritis on treatment #4Chronic lymphoblastic leukemia #5History of recurrent DVTs chronically anticoagulated #6 History of coronary artery disease  Plan; start Pepcid 40 mg p.o. twice daily Will be scheduled for upper endoscopy with probable esophageal dilation with Dr. Hilarie Fredrickson.  Procedure was discussed in detail with the patient including indications risks and benefits and he is agreeable to proceed Patient will need to hold his anticoagulant/Savasya for 48 hours prior to procedure and we will communicate with his PCP Dr. Ronnald Ramp to assure that this is reasonable for this patient.   Further plans pending results of EGD.   After reviewing chart, patient is also due for follow-up colonoscopy.  Will need to be addressed at the time of EGD and can be scheduled sometime in the next few  months.  [name] S Marcello Tuzzolino PA-C 02/11/2018   Cc: [name], MD  Addendum: Reviewed and agree with initial management. [name], Christopher Lines, MD

## 2018-02-11 NOTE — Telephone Encounter (Signed)
[facility] Pre-operative Risk Assessment     Request for surgical clearance:     Endoscopy Procedure  What type of surgery is being performed? Endscopy with dilation  When is this surgery scheduled?     Monday 02-15-2018 We will need to know the Savaysa clearance instructions as soon as possible.   What type of clearance is required ?   Pharmacy-   Are there any medications that need to be held prior to surgery and how long? [place] name and name of physician performing surgery?      [facility]  What is your office phone and fax number?      Phone- [phone]  [phone]  Anesthesia type (None, local, MAC, general) ?       MAC

## 2018-02-11 NOTE — Telephone Encounter (Signed)
I reouted a message regarding the EGD with Dr. Carlean Purl for EGD on 02-15-18.  We need clearance for the Savaysa blood thinner.  Dr. [name] manages it and he is out of the office.  His CMA, Steffanie Cairrikier Monia Pouch reviewed the chart and got back to me after she got an answer and the patient can hold the blood thinner , Savaysa, on Saturday 10-5,, Sunday 10-6 and Monday 10-7am.

## 2018-02-12 ENCOUNTER — Other Ambulatory Visit: Payer: Self-pay | Admitting: *Deleted

## 2018-02-15 ENCOUNTER — Telehealth: Payer: Self-pay | Admitting: *Deleted

## 2018-02-15 ENCOUNTER — Encounter: Payer: Self-pay | Admitting: Internal Medicine

## 2018-02-15 ENCOUNTER — Ambulatory Visit (AMBULATORY_SURGERY_CENTER): Payer: Medicare Other | Admitting: Internal Medicine

## 2018-02-15 VITALS — BP 92/68 | HR 61 | Temp 98.7°F | Resp 14 | Ht 69.0 in | Wt 197.0 lb

## 2018-02-15 DIAGNOSIS — R1310 Dysphagia, unspecified: Secondary | ICD-10-CM

## 2018-02-15 DIAGNOSIS — K2950 Unspecified chronic gastritis without bleeding: Secondary | ICD-10-CM | POA: Diagnosis not present

## 2018-02-15 DIAGNOSIS — Z8673 Personal history of transient ischemic attack (TIA), and cerebral infarction without residual deficits: Secondary | ICD-10-CM | POA: Diagnosis not present

## 2018-02-15 DIAGNOSIS — I2510 Atherosclerotic heart disease of native coronary artery without angina pectoris: Secondary | ICD-10-CM | POA: Diagnosis not present

## 2018-02-15 DIAGNOSIS — Z951 Presence of aortocoronary bypass graft: Secondary | ICD-10-CM | POA: Diagnosis not present

## 2018-02-15 DIAGNOSIS — K2970 Gastritis, unspecified, without bleeding: Secondary | ICD-10-CM | POA: Diagnosis not present

## 2018-02-15 DIAGNOSIS — R1319 Other dysphagia: Secondary | ICD-10-CM

## 2018-02-15 MED ORDER — SODIUM CHLORIDE 0.9 % IV SOLN: 500.0000 mL | Freq: Once | INTRAVENOUS | Status: DC

## 2018-02-15 NOTE — Progress Notes (Signed)
Report given to PACU, vss 

## 2018-02-15 NOTE — Op Note (Signed)
[place] Patient Name: [name] Procedure Date: 02/15/2018 3:33 PM MRN: [id number] Endoscopist: Jerene Bears , MD Age: 67 Referring MD:  Date of Birth: 1950/09/26 Gender: Male Account #: [id number] Procedure:                Upper GI endoscopy Indications:              Dysphagia, Odynophagia, history of GERD with                            esophagitis, history of previous esophageal                            dilation with favorable response for dysphagia Medicines:                Monitored Anesthesia Care Procedure:                Pre-Anesthesia Assessment:                           - Prior to the procedure, a History and Physical                            was performed, and patient medications and                            allergies were reviewed. The patient's tolerance of                            previous anesthesia was also reviewed. The risks                            and benefits of the procedure and the sedation                            options and risks were discussed with the patient.                            All questions were answered, and informed consent                            was obtained. Prior Anticoagulants: The patient has                            taken anticoagulant medication [facility]), last dose                            was 2 days prior to procedure. ASA Grade                            Assessment: III - A patient with severe systemic                            disease. After reviewing the risks and benefits,  the patient was deemed in satisfactory condition to                            undergo the procedure.                           After obtaining informed consent, the endoscope was                            passed under direct vision. Throughout the                            procedure, the patient's blood pressure, pulse, and                            oxygen saturations were monitored  continuously. The                            Endoscope was introduced through the mouth, and                            advanced to the second part of duodenum. The upper                            GI endoscopy was accomplished without difficulty.                            The patient tolerated the procedure well. Scope In: Scope Out: Findings:                 The lower third of the esophagus was tortuous.                           The Z-line was regular and was found 36 cm from the                            incisors.                           No endoscopic abnormality was evident in the                            esophagus to explain the patient's complaint of                            dysphagia. It was decided, however, to proceed with                            dilation of the lower third of the esophagus and at                            the gastroesophageal junction. A TTS dilator was  passed through the scope. Dilation with a 16-17-18                            mm balloon dilator was performed to 18 mm.                           Localized mild inflammation characterized by                            erosions and erythema was found in the cardia.                            Biopsies were taken with a cold forceps for                            histology and Helicobacter pylori testing.                           The exam of the stomach was otherwise normal.                           The examined duodenum was normal. Complications:            No immediate complications. Estimated Blood Loss:     Estimated blood loss was minimal. Impression:               - Tortuous distal esophagus.                           - Z-line regular, 36 cm from the incisors.                           - No endoscopic esophageal abnormality to explain                            patient's dysphagia. Esophagus dilated to 18 mm                            with balloon.                            - Mild gastritis in proximal stomach, distal to GE                            junction. Biopsied.                           - Normal examined duodenum. Recommendation:           - Patient has a contact number available for                            emergencies. The signs and symptoms of potential                            delayed complications were discussed with the  patient. Return to normal activities tomorrow.                            Written discharge instructions were provided to the                            patient.                           - Resume previous diet.                           - Continue present medications. Continue Pepcid                            (famotidine) 40 mg twice daily.                           - Resume Savaysa at prior dose tomorrow. Refer to                            managing physician for further adjustment of                            therapy.                           - Await pathology results.                           - Repeat upper endoscopy PRN for retreatment.                           - Surveillance colonoscopy is due this year and can                            be scheduled when patient amenable (will need to                            contact provider managing Savaysa for permission to                            hold before colonoscopy). Jerene Bears, MD 02/15/2018 4:10:44 PM This report has been signed electronically.

## 2018-02-15 NOTE — Progress Notes (Deleted)
Called to room to assist during endoscopic procedure.  Patient ID and intended procedure confirmed with present staff. Received instructions for my participation in the procedure from the performing physician.  

## 2018-02-15 NOTE — Telephone Encounter (Signed)
-----   Message from Jerene Bears, MD sent at 02/15/2018  4:19 PM EDT ----- Needs surveillance colon Was here for egd today, seen by AE recently Colon is hx of polyps.  I do not think we need to see him to discuss holding anticoag [facility]). Ok to get permission and then schedule him for previsit/direct assuming prescribing MD okay holding anticoag (since I have just seen him today and discussed.  He is willing and wanting to pursue surveillance colon) JMP

## 2018-02-15 NOTE — Progress Notes (Signed)
Called to room to assist during endoscopic procedure.  Patient ID and intended procedure confirmed with present staff. Received instructions for my participation in the procedure from the performing physician.  

## 2018-02-15 NOTE — Patient Instructions (Signed)
*   resume normal diet per Dr.Pyrtle, resume Savaysa tomorrow at prior dose. Call office to discuss scheduling colonoscopy*   YOU HAD AN ENDOSCOPIC PROCEDURE TODAY AT [facility]:   Refer to the procedure report that was given to you for any specific questions about what was found during the examination.  If the procedure report does not answer your questions, please call your gastroenterologist to clarify.  If you requested that your care partner not be given the details of your procedure findings, then the procedure report has been included in a sealed envelope for you to review at your convenience later.  YOU SHOULD EXPECT: Some feelings of bloating in the abdomen. Passage of more gas than usual.  Walking can help get rid of the air that was put into your GI tract during the procedure and reduce the bloating. If you had a lower endoscopy (such as a colonoscopy or flexible sigmoidoscopy) you may notice spotting of blood in your stool or on the toilet paper. If you underwent a bowel prep for your procedure, you may not have a normal bowel movement for a few days.  Please Note:  You might notice some irritation and congestion in your nose or some drainage.  This is from the oxygen used during your procedure.  There is no need for concern and it should clear up in a day or so.  SYMPTOMS TO REPORT IMMEDIATELY:    Following upper endoscopy (EGD)  Vomiting of blood or coffee ground material  New chest pain or pain under the shoulder blades  Painful or persistently difficult swallowing  New shortness of breath  Fever of 100F or higher  Black, tarry-looking stools  For urgent or emergent issues, a gastroenterologist can be reached at any hour by calling [phone].   DIET:  We do recommend a small meal at first, but then you may proceed to your regular diet.  Drink plenty of fluids but you should avoid alcoholic beverages for 24 hours.  ACTIVITY:  You should plan to take it  easy for the rest of today and you should NOT DRIVE or use heavy machinery until tomorrow (because of the sedation medicines used during the test).    FOLLOW UP: Our staff will call the number listed on your records the next business day following your procedure to check on you and address any questions or concerns that you may have regarding the information given to you following your procedure. If we do not reach you, we will leave a message.  However, if you are feeling well and you are not experiencing any problems, there is no need to return our call.  We will assume that you have returned to your regular daily activities without incident.  If any biopsies were taken you will be contacted by phone or by letter within the next 1-3 weeks.  Please call us at [phone] if you have not heard about the biopsies in 3 weeks.    SIGNATURES/CONFIDENTIALITY: You and/or your care partner have signed paperwork which will be entered into your electronic medical record.  These signatures attest to the fact that that the information above on your After Visit Summary has been reviewed and is understood.  Full responsibility of the confidentiality of this discharge information lies with you and/or your care-partner.

## 2018-02-15 NOTE — Progress Notes (Signed)
Pt's states no medical or surgical changes since previsit or office visit. 

## 2018-02-16 ENCOUNTER — Telehealth: Payer: Self-pay

## 2018-02-16 ENCOUNTER — Telehealth: Payer: Self-pay | Admitting: *Deleted

## 2018-02-16 ENCOUNTER — Encounter: Payer: Self-pay | Admitting: Internal Medicine

## 2018-02-16 NOTE — Telephone Encounter (Signed)
  Follow up Call-  Call back number 02/15/2018  Post procedure Call Back phone  # [phone]  Permission to leave phone message Yes  Some recent data might be hidden     Patient questions:  Do you have a fever, pain , or abdominal swelling? No. Pain Score  0 *  Have you tolerated food without any problems? Yes.    Have you been able to return to your normal activities? Yes.    Do you have any questions about your discharge instructions: Diet   No. Medications  No. Follow up visit  No.  Do you have questions or concerns about your Care? No.  Actions: * If pain score is 4 or above: No action needed, pain <4.

## 2018-02-16 NOTE — Telephone Encounter (Signed)
  TAKSH HJORT 1951-01-18 [id number]  Dear Dr Alona Bene:  We have scheduled the above named patient for a(n) colonoscopy procedure. Our records show that (s)he is on anticoagulation therapy.  Please advise as to whether the patient may come off their therapy of Savaysa 2 days prior to their procedure which we are waiting to schedule upon your approval  Please route your response to Dixon Boos, [place].  Sincerely,  [facility]

## 2018-02-18 ENCOUNTER — Encounter: Payer: Self-pay | Admitting: Internal Medicine

## 2018-02-19 ENCOUNTER — Other Ambulatory Visit: Payer: Self-pay | Admitting: Internal Medicine

## 2018-02-19 DIAGNOSIS — F3341 Major depressive disorder, recurrent, in partial remission: Secondary | ICD-10-CM

## 2018-02-21 ENCOUNTER — Other Ambulatory Visit: Payer: Self-pay | Admitting: Internal Medicine

## 2018-02-21 DIAGNOSIS — B59 Pneumocystosis: Secondary | ICD-10-CM

## 2018-02-22 NOTE — Telephone Encounter (Signed)
Yes. He can hold SAVAYSA and restart when it is safe.

## 2018-02-22 NOTE — Telephone Encounter (Signed)
Pt is having colonoscopy procedure. Dr. Hilarie Fredrickson would like pt to stop the Savaysa 2 days prior. The restart date is not yet determined.   Please advise if it is okay for pt to stop the [facility] as described above.

## 2018-02-22 NOTE — Telephone Encounter (Signed)
Recap: Pt stop Savaysa 2 days prior. When would pt be able to restart therapy?

## 2018-02-22 NOTE — Telephone Encounter (Signed)
Possible day of procedure, but this decision will be will be based on findings at colonoscopy and based on any intervention required during colonoscopy such as polypectomy

## 2018-02-23 NOTE — Telephone Encounter (Signed)
See 02/16/18 anticoag clearance letter with clearance given by Dr Marcello Moores for [facility] hold 2 days prior to test. Left voicemail for patient to call back.

## 2018-02-24 ENCOUNTER — Encounter: Payer: Self-pay | Admitting: Internal Medicine

## 2018-02-24 NOTE — Telephone Encounter (Signed)
Called and spoke to pt. Scheduled him for his colon with Pyrtle on 03-23-18.  Tried to schedule his nurse previst but there are no available slots between now and 03-22-18. Told him we would call him back to arrange instructing him and holding his Savaysa.

## 2018-02-25 NOTE — Telephone Encounter (Signed)
Patient has been scheduled to see previsit on 03/01/18 at 230pm. He verbalizes understanding.

## 2018-02-26 DIAGNOSIS — F4321 Adjustment disorder with depressed mood: Secondary | ICD-10-CM | POA: Diagnosis not present

## 2018-03-01 ENCOUNTER — Ambulatory Visit (AMBULATORY_SURGERY_CENTER): Payer: Self-pay

## 2018-03-01 VITALS — Ht 69.0 in | Wt 194.0 lb

## 2018-03-01 DIAGNOSIS — F4321 Adjustment disorder with depressed mood: Secondary | ICD-10-CM | POA: Diagnosis not present

## 2018-03-01 DIAGNOSIS — Z1211 Encounter for screening for malignant neoplasm of colon: Secondary | ICD-10-CM

## 2018-03-01 MED ORDER — NA SULFATE-K SULFATE-MG SULF 17.5-3.13-1.6 GM/177ML PO SOLN: 1.0000 | Freq: Once | ORAL | 0 refills | Status: AC

## 2018-03-01 NOTE — Progress Notes (Signed)
Denies allergies to eggs or soy products. Denies complication of anesthesia or sedation. Denies use of weight loss medication. Denies use of O2.   Emmi instructions declined.  

## 2018-03-09 ENCOUNTER — Encounter: Payer: Medicare Other | Admitting: Allergy & Immunology

## 2018-03-10 ENCOUNTER — Telehealth: Payer: Self-pay | Admitting: Oncology

## 2018-03-10 NOTE — Telephone Encounter (Signed)
Patient called to cancel °

## 2018-03-11 ENCOUNTER — Inpatient Hospital Stay: Payer: Medicare Other | Admitting: Oncology

## 2018-03-11 ENCOUNTER — Inpatient Hospital Stay: Payer: Medicare Other

## 2018-03-12 ENCOUNTER — Ambulatory Visit: Payer: Self-pay | Admitting: Oncology

## 2018-03-12 ENCOUNTER — Other Ambulatory Visit: Payer: Self-pay

## 2018-03-15 ENCOUNTER — Other Ambulatory Visit: Payer: Self-pay | Admitting: Internal Medicine

## 2018-03-15 DIAGNOSIS — M792 Neuralgia and neuritis, unspecified: Secondary | ICD-10-CM

## 2018-03-15 MED ORDER — PREGABALIN 200 MG PO CAPS: 200.0000 mg | ORAL_CAPSULE | Freq: Two times a day (BID) | ORAL | 1 refills | Status: DC

## 2018-03-15 NOTE — Telephone Encounter (Signed)
Copied from [place] [id number]. Topic: Quick Communication - Rx Refill/Question >> Mar 15, 2018  8:21 AM Reyne Dumas L wrote: Medication:  pregabalin (LYRICA) 200 MG capsule  Deborah Chalk from [facility] called.  States they were unable to get pt his box of medicine this month and have all Rxs transferred to local pharmacy for a 30 day supply except for pregabalin (LYRICA) 200 MG capsule, because it is a controlled substance.  Pt is aware of medication change for this month - picking up at local pharmacy.  Pt is completely out of medication and [facility] is asking if we can call that in to local for pt.  Preferred Pharmacy (with phone number or street name): [id number] [place], [place] [phone] (Phone) [phone] (Fax)  Agent: Please be advised that RX refills may take up to 3 business days. We ask that you follow-up with your pharmacy.

## 2018-03-15 NOTE — Telephone Encounter (Signed)
[place] Controlled Substance Database checked. Last filled on 11/17/17 90 day supply.   Last OV 01/14/18

## 2018-03-15 NOTE — Telephone Encounter (Signed)
See pharmacy request for controlled medication.

## 2018-03-18 DIAGNOSIS — F4321 Adjustment disorder with depressed mood: Secondary | ICD-10-CM | POA: Diagnosis not present

## 2018-03-18 DIAGNOSIS — B59 Pneumocystosis: Secondary | ICD-10-CM | POA: Diagnosis not present

## 2018-03-18 DIAGNOSIS — Z6829 Body mass index (BMI) 29.0-29.9, adult: Secondary | ICD-10-CM | POA: Diagnosis not present

## 2018-03-18 DIAGNOSIS — C9110 Chronic lymphocytic leukemia of B-cell type not having achieved remission: Secondary | ICD-10-CM | POA: Diagnosis not present

## 2018-03-18 DIAGNOSIS — J849 Interstitial pulmonary disease, unspecified: Secondary | ICD-10-CM | POA: Diagnosis not present

## 2018-03-18 DIAGNOSIS — Z21 Asymptomatic human immunodeficiency virus [HIV] infection status: Secondary | ICD-10-CM | POA: Diagnosis not present

## 2018-03-18 DIAGNOSIS — M1A09X0 Idiopathic chronic gout, multiple sites, without tophus (tophi): Secondary | ICD-10-CM | POA: Diagnosis not present

## 2018-03-18 DIAGNOSIS — M150 Primary generalized (osteo)arthritis: Secondary | ICD-10-CM | POA: Diagnosis not present

## 2018-03-18 DIAGNOSIS — E663 Overweight: Secondary | ICD-10-CM | POA: Diagnosis not present

## 2018-03-18 DIAGNOSIS — M0609 Rheumatoid arthritis without rheumatoid factor, multiple sites: Secondary | ICD-10-CM | POA: Diagnosis not present

## 2018-03-18 DIAGNOSIS — M2550 Pain in unspecified joint: Secondary | ICD-10-CM | POA: Diagnosis not present

## 2018-03-19 ENCOUNTER — Telehealth: Payer: Self-pay | Admitting: *Deleted

## 2018-03-19 NOTE — Telephone Encounter (Signed)
Called patient. Spoke with him about his up coming procedure at [facility]. Patient denies any Oxygen use, he states he has been off this for couple of years. Pt denies any breathing problems at this time. I just called him to make sure no O2 use before [facility] procedure.

## 2018-03-23 ENCOUNTER — Encounter: Payer: Self-pay | Admitting: Internal Medicine

## 2018-03-23 ENCOUNTER — Ambulatory Visit (AMBULATORY_SURGERY_CENTER): Payer: Medicare Other | Admitting: Internal Medicine

## 2018-03-23 VITALS — BP 121/65 | HR 74 | Temp 97.5°F | Resp 16 | Ht 69.0 in | Wt 194.0 lb

## 2018-03-23 DIAGNOSIS — D125 Benign neoplasm of sigmoid colon: Secondary | ICD-10-CM | POA: Diagnosis not present

## 2018-03-23 DIAGNOSIS — K635 Polyp of colon: Secondary | ICD-10-CM | POA: Diagnosis not present

## 2018-03-23 DIAGNOSIS — D124 Benign neoplasm of descending colon: Secondary | ICD-10-CM

## 2018-03-23 DIAGNOSIS — D123 Benign neoplasm of transverse colon: Secondary | ICD-10-CM

## 2018-03-23 DIAGNOSIS — I10 Essential (primary) hypertension: Secondary | ICD-10-CM | POA: Diagnosis not present

## 2018-03-23 DIAGNOSIS — I2510 Atherosclerotic heart disease of native coronary artery without angina pectoris: Secondary | ICD-10-CM | POA: Diagnosis not present

## 2018-03-23 DIAGNOSIS — Z1211 Encounter for screening for malignant neoplasm of colon: Secondary | ICD-10-CM | POA: Diagnosis not present

## 2018-03-23 DIAGNOSIS — D122 Benign neoplasm of ascending colon: Secondary | ICD-10-CM

## 2018-03-23 LAB — HM COLONOSCOPY

## 2018-03-23 MED ORDER — SODIUM CHLORIDE 0.9 % IV SOLN: 500.0000 mL | Freq: Once | INTRAVENOUS | Status: DC

## 2018-03-23 NOTE — Patient Instructions (Signed)
Handouts:  Polyps, Hemorrhoids, Diverticulosis  YOU HAD AN ENDOSCOPIC PROCEDURE TODAY AT THE [facility]:   Refer to the procedure report that was given to you for any specific questions about what was found during the examination.  If the procedure report does not answer your questions, please call your gastroenterologist to clarify.  If you requested that your care partner not be given the details of your procedure findings, then the procedure report has been included in a sealed envelope for you to review at your convenience later.  YOU SHOULD EXPECT: Some feelings of bloating in the abdomen. Passage of more gas than usual.  Walking can help get rid of the air that was put into your GI tract during the procedure and reduce the bloating. If you had a lower endoscopy (such as a colonoscopy or flexible sigmoidoscopy) you may notice spotting of blood in your stool or on the toilet paper. If you underwent a bowel prep for your procedure, you may not have a normal bowel movement for a few days.  Please Note:  You might notice some irritation and congestion in your nose or some drainage.  This is from the oxygen used during your procedure.  There is no need for concern and it should clear up in a day or so.  SYMPTOMS TO REPORT IMMEDIATELY:   Following lower endoscopy (colonoscopy or flexible sigmoidoscopy):  Excessive amounts of blood in the stool  Significant tenderness or worsening of abdominal pains  Swelling of the abdomen that is new, acute  Fever of 100F or higher  For urgent or emergent issues, a gastroenterologist can be reached at any hour by calling [phone].   DIET:  We do recommend a small meal at first, but then you may proceed to your regular diet.  Drink plenty of fluids but you should avoid alcoholic beverages for 24 hours.  ACTIVITY:  You should plan to take it easy for the rest of today and you should NOT DRIVE or use heavy machinery until tomorrow (because of  the sedation medicines used during the test).    FOLLOW UP: Our staff will call the number listed on your records the next business day following your procedure to check on you and address any questions or concerns that you may have regarding the information given to you following your procedure. If we do not reach you, we will leave a message.  However, if you are feeling well and you are not experiencing any problems, there is no need to return our call.  We will assume that you have returned to your regular daily activities without incident.  If any biopsies were taken you will be contacted by phone or by letter within the next 1-3 weeks.  Please call us at [phone] if you have not heard about the biopsies in 3 weeks.    SIGNATURES/CONFIDENTIALITY: You and/or your care partner have signed paperwork which will be entered into your electronic medical record.  These signatures attest to the fact that that the information above on your After Visit Summary has been reviewed and is understood.  Full responsibility of the confidentiality of this discharge information lies with you and/or your care-partner.

## 2018-03-23 NOTE — Progress Notes (Signed)
Called to room to assist during endoscopic procedure.  Patient ID and intended procedure confirmed with present staff. Received instructions for my participation in the procedure from the performing physician.  

## 2018-03-23 NOTE — Op Note (Signed)
[facility] Patient Name: [name] Procedure Date: 03/23/2018 3:16 PM MRN: [id number] Endoscopist: Jerene Bears , MD Age: 67 Referring MD:  Date of Birth: 1950-10-11 Gender: Male Account #: [id number] Procedure:                Colonoscopy Indications:              Surveillance: Personal history of adenomatous                            polyps on last colonoscopy 5 years ago Medicines:                Monitored Anesthesia Care Procedure:                Pre-Anesthesia Assessment:                           - Prior to the procedure, a History and Physical                            was performed, and patient medications and                            allergies were reviewed. The patient's tolerance of                            previous anesthesia was also reviewed. The risks                            and benefits of the procedure and the sedation                            options and risks were discussed with the patient.                            All questions were answered, and informed consent                            was obtained. Prior Anticoagulants: The patient has                            taken anticoagulant medication (edoxaban), last                            dose was 3 days prior to procedure. ASA Grade                            Assessment: III - A patient with severe systemic                            disease. After reviewing the risks and benefits,                            the patient was deemed in satisfactory condition to  undergo the procedure.                           After obtaining informed consent, the colonoscope                            was passed under direct vision. Throughout the                            procedure, the patient's blood pressure, pulse, and                            oxygen saturations were monitored continuously. The                            Model PCF-H190DL [id number]) scope was  introduced                            through the anus and advanced to the cecum,                            identified by appendiceal orifice and ileocecal                            valve. The colonoscopy was performed without                            difficulty. The patient tolerated the procedure                            well. The quality of the bowel preparation was good. Scope In: 3:24:57 PM Scope Out: 3:43:10 PM Scope Withdrawal Time: 0 hours 15 minutes 33 seconds  Total Procedure Duration: 0 hours 18 minutes 13 seconds  Findings:                 The digital rectal exam was normal.                           Two sessile polyps were found in the ascending                            colon. The polyps were 2 to 3 mm in size. These                            polyps were removed with a cold snare. Resection                            and retrieval were complete.                           A 4 mm polyp was found in the transverse colon. The                            polyp was sessile. The polyp was removed  with a                            cold snare. Resection and retrieval were complete.                           A 5 mm polyp was found in the descending colon. The                            polyp was sessile. The polyp was removed with a                            cold snare. Resection and retrieval were complete.                           A 4 mm polyp was found in the sigmoid colon. The                            polyp was sessile. The polyp was removed with a                            cold snare. Resection and retrieval were complete.                           Multiple small and large-mouthed diverticula were                            found in the sigmoid colon and descending colon.                           Internal hemorrhoids were found during                            retroflexion. The hemorrhoids were medium-sized. Complications:            No immediate  complications. Estimated Blood Loss:     Estimated blood loss was minimal. Impression:               - Two 2 to 3 mm polyps in the ascending colon,                            removed with a cold snare. Resected and retrieved.                           - One 4 mm polyp in the transverse colon, removed                            with a cold snare. Resected and retrieved.                           - One 5 mm polyp in the descending colon, removed  with a cold snare. Resected and retrieved.                           - One 4 mm polyp in the sigmoid colon, removed with                            a cold snare. Resected and retrieved.                           - Severe diverticulosis in the sigmoid colon and in                            the descending colon.                           - Internal hemorrhoids. Recommendation:           - Patient has a contact number available for                            emergencies. The signs and symptoms of potential                            delayed complications were discussed with the                            patient. Return to normal activities tomorrow.                            Written discharge instructions were provided to the                            patient.                           - Resume previous diet.                           - Continue present medications.                           - Resume edoxaban (Savaysa) at previous dose                            tomorrow per prescribing provider.                           - Await pathology results.                           - Repeat colonoscopy is recommended for                            surveillance. The colonoscopy date will be                            determined after  pathology results from today's                            exam become available for review. Jerene Bears, MD 03/23/2018 3:49:44 PM This report has been signed electronically.

## 2018-03-23 NOTE — Progress Notes (Signed)
Pt's states no medical or surgical changes since previsit or office visit. 

## 2018-03-23 NOTE — Progress Notes (Signed)
Report to RN, VSS, adequate respirations noted, no c/o pain or discomfort 

## 2018-03-24 ENCOUNTER — Telehealth: Payer: Self-pay | Admitting: *Deleted

## 2018-03-24 NOTE — Telephone Encounter (Signed)
  Follow up Call-  Call back number 03/23/2018 02/15/2018  Post procedure Call Back phone  # [phone] cell [phone]  Permission to leave phone message Yes Yes  Some recent data might be hidden     Patient questions:  Do you have a fever, pain , or abdominal swelling? No. Pain Score  0 *  Have you tolerated food without any problems? Yes.    Have you been able to return to your normal activities? Yes.    Do you have any questions about your discharge instructions: Diet   No. Medications  No. Follow up visit  No.  Do you have questions or concerns about your Care? No.  Actions: * If pain score is 4 or above: No action needed, pain <4.

## 2018-03-30 ENCOUNTER — Encounter: Payer: Self-pay | Admitting: Internal Medicine

## 2018-03-30 ENCOUNTER — Telehealth: Payer: Self-pay | Admitting: Oncology

## 2018-03-30 NOTE — Telephone Encounter (Signed)
Patient called to reschedule appointment missed  °

## 2018-04-01 DIAGNOSIS — R0902 Hypoxemia: Secondary | ICD-10-CM | POA: Diagnosis not present

## 2018-04-01 DIAGNOSIS — R55 Syncope and collapse: Secondary | ICD-10-CM | POA: Diagnosis not present

## 2018-04-01 DIAGNOSIS — R42 Dizziness and giddiness: Secondary | ICD-10-CM | POA: Diagnosis not present

## 2018-04-05 ENCOUNTER — Telehealth: Payer: Self-pay | Admitting: Cardiology

## 2018-04-05 MED ORDER — ATORVASTATIN CALCIUM 10 MG PO TABS: 10.0000 mg | ORAL_TABLET | Freq: Every day | ORAL | 11 refills | Status: DC

## 2018-04-05 NOTE — Telephone Encounter (Signed)
Verbal order sent to pharmacy for requested med below ./cy

## 2018-04-05 NOTE — Telephone Encounter (Signed)
New Message          *STAT* If patient is at the pharmacy, call can be transferred to refill team.   1. Which medications need to be refilled? (please list name of each medication and dose if known) Atorvasation 10 mg  2. Which pharmacy/location (including street and city if local pharmacy) is medication to be sent to? [facility]  3. Do they need a 30 day or 90 day supply? [place]

## 2018-04-06 ENCOUNTER — Encounter: Payer: Self-pay | Admitting: *Deleted

## 2018-04-22 ENCOUNTER — Ambulatory Visit: Payer: Medicare Other | Admitting: Allergy & Immunology

## 2018-04-23 DIAGNOSIS — M25562 Pain in left knee: Secondary | ICD-10-CM | POA: Diagnosis not present

## 2018-04-23 DIAGNOSIS — M25552 Pain in left hip: Secondary | ICD-10-CM | POA: Diagnosis not present

## 2018-04-24 ENCOUNTER — Other Ambulatory Visit: Payer: Self-pay | Admitting: Internal Medicine

## 2018-04-24 DIAGNOSIS — E5111 Dry beriberi: Secondary | ICD-10-CM

## 2018-05-04 ENCOUNTER — Ambulatory Visit: Payer: Medicare Other | Admitting: Allergy & Immunology

## 2018-05-04 DIAGNOSIS — J309 Allergic rhinitis, unspecified: Secondary | ICD-10-CM

## 2018-05-13 ENCOUNTER — Other Ambulatory Visit: Payer: Self-pay | Admitting: Oncology

## 2018-05-13 ENCOUNTER — Inpatient Hospital Stay: Payer: Medicare Other | Attending: Oncology

## 2018-05-13 ENCOUNTER — Inpatient Hospital Stay (HOSPITAL_BASED_OUTPATIENT_CLINIC_OR_DEPARTMENT_OTHER): Payer: Medicare Other | Admitting: Oncology

## 2018-05-13 ENCOUNTER — Ambulatory Visit (INDEPENDENT_AMBULATORY_CARE_PROVIDER_SITE_OTHER): Payer: Medicare Other | Admitting: Orthopedic Surgery

## 2018-05-13 ENCOUNTER — Telehealth: Payer: Self-pay | Admitting: Oncology

## 2018-05-13 ENCOUNTER — Encounter (INDEPENDENT_AMBULATORY_CARE_PROVIDER_SITE_OTHER): Payer: Self-pay | Admitting: Orthopedic Surgery

## 2018-05-13 ENCOUNTER — Ambulatory Visit (INDEPENDENT_AMBULATORY_CARE_PROVIDER_SITE_OTHER): Payer: Medicare Other

## 2018-05-13 VITALS — BP 142/84 | HR 69 | Temp 97.7°F | Resp 17 | Ht 69.0 in | Wt 197.5 lb

## 2018-05-13 DIAGNOSIS — D72829 Elevated white blood cell count, unspecified: Secondary | ICD-10-CM | POA: Diagnosis not present

## 2018-05-13 DIAGNOSIS — C9110 Chronic lymphocytic leukemia of B-cell type not having achieved remission: Secondary | ICD-10-CM | POA: Diagnosis not present

## 2018-05-13 DIAGNOSIS — M25562 Pain in left knee: Secondary | ICD-10-CM

## 2018-05-13 DIAGNOSIS — M13812 Other specified arthritis, left shoulder: Secondary | ICD-10-CM

## 2018-05-13 DIAGNOSIS — Z21 Asymptomatic human immunodeficiency virus [HIV] infection status: Secondary | ICD-10-CM | POA: Insufficient documentation

## 2018-05-13 LAB — CBC WITH DIFFERENTIAL (CANCER CENTER ONLY)
Abs Immature Granulocytes: 0.03 10*3/uL (ref 0.00–0.07)
Basophils Absolute: 0 10*3/uL (ref 0.0–0.1)
Basophils Relative: 0 %
Eosinophils Absolute: 0.1 10*3/uL (ref 0.0–0.5)
Eosinophils Relative: 0 %
HCT: 42.2 % (ref 39.0–52.0)
Hemoglobin: 13.6 g/dL (ref 13.0–17.0)
Immature Granulocytes: 0 %
Lymphocytes Relative: 74 %
Lymphs Abs: 10.8 10*3/uL — ABNORMAL HIGH (ref 0.7–4.0)
MCH: 33.3 pg (ref 26.0–34.0)
MCHC: 32.2 g/dL (ref 30.0–36.0)
MCV: 103.2 fL — ABNORMAL HIGH (ref 80.0–100.0)
Monocytes Absolute: 1.2 10*3/uL — ABNORMAL HIGH (ref 0.1–1.0)
Monocytes Relative: 8 %
Neutro Abs: 2.7 10*3/uL (ref 1.7–7.7)
Neutrophils Relative %: 18 %
Platelet Count: 117 10*3/uL — ABNORMAL LOW (ref 150–400)
RBC: 4.09 MIL/uL — ABNORMAL LOW (ref 4.22–5.81)
RDW: 14.5 % (ref 11.5–15.5)
WBC Count: 14.8 10*3/uL — ABNORMAL HIGH (ref 4.0–10.5)
nRBC: 0 % (ref 0.0–0.2)

## 2018-05-13 LAB — CMP (CANCER CENTER ONLY)
ALT: 33 U/L (ref 0–44)
AST: 26 U/L (ref 15–41)
Albumin: 3.4 g/dL — ABNORMAL LOW (ref 3.5–5.0)
Alkaline Phosphatase: 111 U/L (ref 38–126)
Anion gap: 8 (ref 5–15)
BUN: 16 mg/dL (ref 8–23)
CO2: 29 mmol/L (ref 22–32)
Calcium: 8.2 mg/dL — ABNORMAL LOW (ref 8.9–10.3)
Chloride: 103 mmol/L (ref 98–111)
Creatinine: 0.84 mg/dL (ref 0.61–1.24)
GFR, Est AFR Am: >60 mL/min (ref >60)
GFR, Estimated: >60 mL/min (ref >60)
Glucose, Bld: 99 mg/dL (ref 70–99)
Potassium: 4 mmol/L (ref 3.5–5.1)
Sodium: 140 mmol/L (ref 135–145)
Total Bilirubin: 0.6 mg/dL (ref 0.3–1.2)
Total Protein: 5.7 g/dL — ABNORMAL LOW (ref 6.5–8.1)

## 2018-05-13 NOTE — Telephone Encounter (Signed)
Printed avs. °

## 2018-05-13 NOTE — Progress Notes (Signed)
Hematology and Oncology Follow Up Visit  THELONIOUS KAUFFMANN [id number] [birth year] 68 y.o. 05/13/2018 8:43 AM Ronnald Ramp [name], MDJones, [name], MD   Principle Diagnosis: 68 year old man with CLL diagnosed in 2007 after presenting with lymphocytosis without lymphadenopathy.    Current therapy: Active surveillance.  Interim History:  Mr. Schnebly returns today for a repeat evaluation.  Since the last visit, he reports feeling well without any recent complaints.  He did sustain a fall at home without any bone fractures but possible knee and shoulder injuries.  He still ambulating with the help of a cane without any worsening pain.  He denies any painful adenopathy or constitutional symptoms.  He is eating well and weight is maintained.  He has not reported any recurrent infections or hospitalizations.    He does not report any headaches, blurry vision or double vision. Does not report any alteration in mental status or confusion.  He does not report any fevers, chills or sweats.  He denies any any chest pain or shortness of breath. Does not report any cough or hemoptysis.  He denies any nausea, vomiting or distention.  He denies any changes in bowel habits.  He is not reporting frequency urgency or hesitancy.  He denies any ecchymosis or petechiae.  No lymphadenopathy or bruising.  Denies any arthralgias or myalgias.  Rest of his review of systems is negative.  Medications: I have reviewed the patient's current medications.  Current Outpatient Medications  Medication Sig Dispense Refill  . atorvastatin (LIPITOR) 10 MG tablet Take 1 tablet (10 mg total) by mouth at bedtime. 30 tablet 11  . clindamycin (CLEOCIN) 150 MG capsule TAKE 4 CAPSULES BY MOUTH ONE FOUR BEFORE APPOINTMENT  2  . [facility] B-1 100 MG tablet TAKE 1 TABLET BY MOUTH EVERY DAY 90 tablet 0  . darunavir (PREZISTA) 800 MG tablet TAKE 1 TABLET (800 MG TOTAL) BY MOUTH DAILY. 90 tablet 1  . elvitegravir-cobicistat-emtricitabine-tenofovir (GENVOYA)  150-150-200-10 MG TABS tablet Take 1 tablet by mouth daily with breakfast. 90 tablet 1  . escitalopram (LEXAPRO) 10 MG tablet TAKE 1 TABLET (10 MG TOTAL) BY MOUTH DAILY. 90 tablet 1  . famotidine (PEPCID) 40 MG tablet Take 1 tablet by mouth twice daily for 2 months. 60 tablet 3  . folic acid (FOLVITE) 1 MG tablet Take 1 mg by mouth daily.  11  . furosemide (LASIX) 20 MG tablet Take 1 tablet (20 mg total) by mouth 2 (two) times daily. 90 tablet 3  . leflunomide (ARAVA) 10 MG tablet Take 10 mg by mouth daily.  2  . metoprolol succinate (TOPROL-XL) 25 MG 24 hr tablet TAKE 1 TABLET BY MOUTH EVERY DAY 90 tablet 3  . nitroGLYCERIN (NITROSTAT) 0.4 MG SL tablet Place 1 tablet (0.4 mg total) under the tongue every 5 (five) minutes as needed for chest pain. 25 tablet 2  . predniSONE (DELTASONE) 5 MG tablet Take 5 mg by mouth daily.  1  . pregabalin (LYRICA) 200 MG capsule Take 1 capsule (200 mg total) by mouth 2 (two) times daily. 180 capsule 1  . REXULTI 0.25 MG TABS Take 1 tablet by mouth daily.  2  . SAVAYSA 30 MG TABS tablet TAKE 1 TABLET (30 MG TOTAL) BY MOUTH DAILY. 90 tablet 1  . sulfamethoxazole-trimethoprim (BACTRIM DS,SEPTRA DS) 800-160 MG tablet TAKE 1 TABLET BY MOUTH EVERY 12 (TWELVE) HOURS. 60 tablet 3  . valACYclovir (VALTREX) 500 MG tablet Take 1 tablet (500 mg total) by mouth daily. 90 tablet 3  No current facility-administered medications for this visit.      Allergies:  Allergies  Allergen Reactions  . Golimumab Anaphylaxis  . Orencia [Abatacept] Anaphylaxis  . Other Anaphylaxis and Hives    Pecan  . Peanut-Containing Drug Products Anaphylaxis, Hives and Swelling    Swelling of throat  . Morphine Other (See Comments)    REACTION: severe headache  . Oxycodone-Acetaminophen Other (See Comments)    REACTION: headache  . Penicillins Rash and Other (See Comments)    FLUSHED, RED  Has patient had a PCN reaction causing immediate rash, facial/tongue/throat swelling, SOB or  lightheadedness with hypotension: #  #  #  YES  #  #  #  Has patient had a PCN reaction causing severe rash involving mucus membranes or skin necrosis: No Has patient had a PCN reaction that required hospitalization No Has patient had a PCN reaction occurring within the last 10 years: No If all of the above answers are "NO", then may proceed with Cephalosporin use  . Promethazine Hcl Other (See Comments)    REACTION: makes him feel drunk at higher strengths    Past Medical History, Surgical history, Social history, and Family History were reviewed and updated.   Physical Exam:  Blood pressure (!) 142/84, pulse 69, temperature 97.7 F (36.5 C), temperature source Oral, resp. rate 17, height 5\' 9"  (1.753 m), weight 197 lb 8 oz (89.6 kg), SpO2 98 %.    ECOG: 1   General appearance: Comfortable appearing without any discomfort Head: Normocephalic without any trauma Oropharynx: Mucous membranes are moist and pink without any thrush or ulcers. Eyes: Pupils are equal and round reactive to light. Lymph nodes: No cervical, supraclavicular, inguinal or axillary lymphadenopathy.   Heart:regular rate and rhythm.  S1 and S2 without leg edema. Lung: Clear without any rhonchi or wheezes.  No dullness to percussion. Abdomin: Soft, nontender, nondistended with good bowel sounds.  No hepatosplenomegaly. Musculoskeletal: No joint deformity or effusion.  Full range of motion noted. Neurological: No deficits noted on motor, sensory and deep tendon reflex exam. Skin: No petechial rash or dryness.  Appeared moist.     Lab Results: Lab Results  Component Value Date   WBC 13.5 (H) 01/07/2018   HGB 14.3 01/07/2018   HCT 42.4 01/07/2018   MCV 97.0 01/07/2018   PLT 133 (L) 01/07/2018     Chemistry      Component Value Date/Time   NA 140 01/07/2018 0852   NA 140 08/13/2016 0919   K 4.4 01/07/2018 0852   K 3.7 08/13/2016 0919   CL 104 01/07/2018 0852   CO2 31 01/07/2018 0852   CO2 27  08/13/2016 0919   BUN 19 01/07/2018 0852   BUN 18.7 08/13/2016 0919   CREATININE 1.12 01/07/2018 0852   CREATININE 1.1 08/13/2016 0919   GLU 92 07/09/2016      Component Value Date/Time   CALCIUM 8.9 01/07/2018 0852   CALCIUM 8.8 08/13/2016 0919   ALKPHOS 77 11/19/2017 1142   ALKPHOS 114 08/13/2016 0919   AST 24 01/07/2018 0852   AST 21 08/13/2016 0919   ALT 22 01/07/2018 0852   ALT 18 08/13/2016 0919   BILITOT 0.7 01/07/2018 0852   BILITOT 0.59 08/13/2016 0919        Impression and Plan:  68 year old man with  1.  CLL diagnosed in 2007.  He was found to have stage 0 with  CD38 positive, ZAP 70 positive with lymphocytosis and no adenopathy.  He has been  on active surveillance without any indication for treatment.  The natural course of this disease and treatment options and indication were reiterated today.  Complications that include painful adenopathy, rapidly rising lymphocytosis, bone marrow failure among others were discussed as indication for treatment.  His laboratory data from today showed a leukocytosis with a white cell count of 14.8 with normal hemoglobin and slight decreased platelets.  No indication at this time for treatment is needed.   2.  Status post fall: No fractures or dislocation.   3.  HIV: No recent exacerbation noted.  Continues to follow with Dr. [name].  4. Follow-up: 6 months to follow his progress.  15  minutes was spent with the patient face-to-face today.  More than 50% of time was dedicated to reviewing his disease status, treatment options and indication.   Zola Button, MD 1/2/20208:43 AM

## 2018-05-14 ENCOUNTER — Encounter (INDEPENDENT_AMBULATORY_CARE_PROVIDER_SITE_OTHER): Payer: Self-pay | Admitting: Family Medicine

## 2018-05-14 ENCOUNTER — Ambulatory Visit (INDEPENDENT_AMBULATORY_CARE_PROVIDER_SITE_OTHER): Payer: Medicare Other | Admitting: Family Medicine

## 2018-05-14 DIAGNOSIS — M25552 Pain in left hip: Secondary | ICD-10-CM

## 2018-05-14 DIAGNOSIS — M13812 Other specified arthritis, left shoulder: Secondary | ICD-10-CM | POA: Diagnosis not present

## 2018-05-14 NOTE — Progress Notes (Signed)
  [name] - 68 y.o. male MRN [id number]  Date of birth: [birth year]    SUBJECTIVE:      Chief Complaint:/ HPI:  68 year old male referred by Dr. Marlou Sa for ultrasound-guided steroid injection in the hip.  Patient had a fall about a month ago which injured his left shoulder, left hip, and left knee.  He has a history of known arthritis in the left hip and has had injections in the past.   ROS:     See HPI  PERTINENT  PMH / PSH FH / / SH:  Past Medical, Surgical, Social, and Family History Reviewed & Updated in the EMR.    OBJECTIVE: There were no vitals taken for this visit.  Physical Exam:  Vital signs are reviewed.  GEN: Alert and oriented, NAD Pulm: Breathing unlabored PSY: normal mood, congruent affect  MSK: Left hip:  - Palpation: No focal TTP - ROM: symmetric ER bilaterally, limited IR with pain on the left - Neuro/vasc: NV intact distally   Procedure performed: hip intraarticular corticosteroid injection; ultrasound guided, in-plane approach  Consent obtained and verified. Time-out conducted. Noted no overlying erythema, induration, or other signs of local infection. The LEFT Hip Joint was identified with ultrasound using. The overlying skin was prepped in a sterile fashion. Topical analgesic spray: Ethyl chloride. Joint: LEFT hip joint Needle: 22GA, 3" Completed without difficulty. Meds: depomedrol 40mg , lidocaine 10cc  Advised to call if fevers/chills, erythema, induration, drainage, or persistent bleeding.     ASSESSMENT & PLAN:  1. Left hip pain - Xrays reviewed which show moderate OA of the left hip. steroid injection performed today under ultrasound guidance. Pt tolerated well and reported immediate improvement in his pain.

## 2018-05-14 NOTE — Progress Notes (Signed)
I saw and examined the patient with Dr. Okey Dupre and agree with assessment and plan as outlined.  Left hip injected today with good relief during anesthetic phase.  RTC with Dr. Marlou Sa as directed.

## 2018-05-16 NOTE — Progress Notes (Signed)
Office Visit Note   Patient: [name]           Date of Birth: [birth year]           MRN: [id number] Visit Date: 05/13/2018 Requested by: [name], MD [address], [place] [address] PCP: [name], MD  Subjective: Chief Complaint  Patient presents with  . Left Knee - Pain  . Left Hip - Pain  . Left Shoulder - Pain    HPI: [name] is a patient with multiple medical problems.  He sustained a fall and hurt his left knee 3 weeks ago.  Radiographs in [place] are reviewed.  There was a suggestion of possible lucency and loosening of the femoral component.  He also hurt his shoulder and hip at that time.  States that his left hip is been painful but his right and left shoulders much more so on the left has been extremely painful and limited functionally.  Been taking hydrocodone for symptoms along with CBD oil and Tylenol.  He has been able to walk on that left knee.  He has significant back issues which gives him kyphosis.  He does weight-bear through that left shoulder.              ROS: All systems reviewed are negative as they relate to the chief complaint within the history of present illness.  Patient denies  fevers or chills.   Assessment & Plan: Visit Diagnoses:  1. Left knee pain, unspecified chronicity   2. Other specified arthritis, left shoulder     Plan: Impression is left knee pain with no distinct clear evidence of loosening at this time radiographically.  I think bone scan of that left knee would be indicated to evaluate the distal femur.  The left hip does have some arthritis but no evidence of fracture.  He has been able to weight-bear on that left side over the last 3 weeks.  Would like to have Dr. Junius Roads inject that left hip.  In regard to the left shoulder he has evidence of massive rotator cuff tear and rotator cuff arthropathy.  Need thin cut CT scan to evaluate for possible preop reverse shoulder replacement.  I am not sure that  skin to be possible based on positioning.  The amount of kyphosis he has may make it impossible for the head to fit headpiece on the [place] frame.  We may have to measure that distance at his next return visit after the CT scan.  Follow-Up Instructions: No follow-ups on file.   Orders:  Orders Placed This Encounter  Procedures  . XR Knee Complete 4 Views Left  . NM Bone Scan 3 Phase Lower Extremity  . CT SHOULDER LEFT WO CONTRAST   No orders of the defined types were placed in this encounter.     Procedures: No procedures performed   Clinical Data: No additional findings.  Objective: Vital Signs: There were no vitals taken for this visit.  Physical Exam:   Constitutional: Patient appears well-developed HEENT:  Head: Normocephalic Eyes:EOM are normal Neck: Normal range of motion Cardiovascular: Normal rate Pulmonary/chest: Effort normal Neurologic: Patient is alert Skin: Skin is warm Psychiatric: Patient has normal mood and affect    Ortho Exam: Ortho exam demonstrates ability to weight-bear.  No effusion in the left knee.  Extensor mechanism is intact.  There is no warmth to the left knee.  Collaterals are stable.  Mild but not severe groin pain  with internal or external rotation of that left leg.  Hip flexion strength is intact.  Patient has very limited forward flexion and abduction of that left arm.  Deltoid is functional but a lot of coarse grinding his crepitus is present with passive range of motion of that left shoulder.  Pedal pulse and radial pulse present on the left-hand side  Specialty Comments:  No specialty comments available.  Imaging: No results found.   PMFS History: Patient Active Problem List   Diagnosis Date Noted  . Tinea corporis 01/22/2018  . Depression with anxiety 01/14/2018  . 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 Carotid artery disease ([facility]) 11/13/2016  . Stenosis of carotid artery 11/13/2016  . Spinal stenosis of thoracolumbar  region 07/02/2016  . ILD (interstitial lung disease) ([facility]) 09/13/2015  . Immunocompromised state ([facility]) 09/13/2015  . PCP (pneumocystis jiroveci pneumonia) ([facility]) 06/18/2015  . Postinflammatory pulmonary fibrosis ([facility]) 05/11/2015  . GERD (gastroesophageal reflux disease)   . Hereditary and idiopathic peripheral neuropathy 09/08/2013  . Erosive esophagitis 12/28/2012  . Allergic rhinitis, cause unspecified 04/28/2012  . Long term current use of anticoagulant therapy 02/03/2012  . Hypertension   . DVT, lower extremity, recurrent ([place])   . Gout   . Carotid artery occlusion   . Hyperlipidemia with target LDL less than 100   . HIV infection ([facility] [place]) 04/08/2011  . Arthritis, rheumatoid ([facility] [facility]) 04/08/2011  . Chronic lymphoblastic leukemia 04/08/2011  . Impotence of organic origin 04/02/2011  . CAD (coronary artery disease) of artery bypass graft 02/14/2009  . Herpes genitalis 06/22/2008   Past Medical History:  Diagnosis Date  . Allergy   . Anxiety   . Carotid artery occlusion    40-60% right ICA stenosis (09/2008)  . Cataract   . Chronic back pain   . CLL (chronic lymphoblastic leukemia) dx 2010   Followed at [facility] q53mo, no current therapy   . Clotting disorder ([facility])   . Coronary artery disease 2010   s/p CABG '10, sees Dr. Percival Spanish  . Depression   . Diverticulosis   . DVT, lower extremity, recurrent ([facility]) 2008, 2009   LLE, chronic anticoag since 2009  . Esophagitis   . Fibromyalgia   . Gallstones   . GERD (gastroesophageal reflux disease)   . Gout   . Gynecomastia, male   . H/O hiatal hernia 2008   surgery  . Hemorrhoids   . Hepatitis A yrs ago  . HIV infection ([facility]) dx 1993  . Hypertension   . Impotence of organic origin   . Myocardial infarction ([facility]) 2010    x 2  . Neuromuscular disorder ([facility])    neuropathy  . Osteoarthritis, knee    s/p B TKA  . Osteoporosis   . Pneumonia mrach, may, july 2017  . Rheumatoid arthritis(714.0) dx 2010   MTX, follows with rheum  .  Seasonal allergies   . Secondary syphilis 07/24/14 dx   s/p 2 wks doxy  . Status post dilation of esophageal narrowing   . Stroke ([place]) [place]   . TIA (transient ischemic attack) 1997   mild residual L mouth droop  . Tubular adenoma of colon     Family History  Problem Relation Age of Onset  . Breast cancer Mother   . Hypertension Mother   . Hyperlipidemia Mother   . Diabetes Mother   . Prostate cancer Father   . Colon polyps Father   . Hyperlipidemia Father   . Crohn's disease Paternal Aunt   .  Diabetes Maternal Grandmother   . Diabetes Brother        x 3  . Heart disease Brother        x 3  . Hyperlipidemia Brother        x 3  . Alcohol abuse Daughter   . Drug abuse Daughter   . Asthma Brother   . Colon cancer Neg Hx   . Esophageal cancer Neg Hx   . Rectal cancer Neg Hx   . Stomach cancer Neg Hx     Past Surgical History:  Procedure Laterality Date  . CHOLECYSTECTOMY    . COLONOSCOPY WITH PROPOFOL N/A 12/28/2012   Procedure: COLONOSCOPY WITH PROPOFOL;  Surgeon: Jerene Bears, MD;  Location: [facility];  Service: Gastroenterology;  Laterality: N/A;  . CORONARY ARTERY BYPASS GRAFT  2010   triple bypass  . ESOPHAGOGASTRODUODENOSCOPY (EGD) WITH PROPOFOL N/A 12/28/2012   Procedure: ESOPHAGOGASTRODUODENOSCOPY (EGD) WITH PROPOFOL;  Surgeon: Jerene Bears, MD;  Location: [facility];  Service: Gastroenterology;  Laterality: N/A;  . ESOPHAGOGASTRODUODENOSCOPY (EGD) WITH PROPOFOL N/A 03/15/2013   Procedure: ESOPHAGOGASTRODUODENOSCOPY (EGD) WITH PROPOFOL;  Surgeon: Jerene Bears, MD;  Location: [facility];  Service: Gastroenterology;  Laterality: N/A;  . ESOPHAGOGASTRODUODENOSCOPY (EGD) WITH PROPOFOL N/A 02/07/2016   Procedure: ESOPHAGOGASTRODUODENOSCOPY (EGD) WITH PROPOFOL;  Surgeon: Jerene Bears, MD;  Location: [facility];  Service: Gastroenterology;  Laterality: N/A;  . HARDWARE REMOVAL N/A 07/02/2012   Procedure: HARDWARE REMOVAL;  Surgeon: Elaina Hoops, MD;  Location: [facility];  Service: Neurosurgery;  Laterality: N/A;  . HIATAL HERNIA REPAIR     wrap  . INGUINAL HERNIA REPAIR Bilateral   . JOINT REPLACEMENT Left 1999  . KNEE ARTHROPLASTY  07/22/2011   Procedure: COMPUTER ASSISTED TOTAL KNEE ARTHROPLASTY;  Surgeon: Meredith Pel, MD;  Location: [facility];  Service: Orthopedics;  Laterality: Right;  Right total knee arthroplasty  . MANDIBLE SURGERY Bilateral    tmj  . REPLACEMENT TOTAL KNEE Bilateral   . ring around testicle hernia reapir  184 and 1986   x 2  . ROTATOR CUFF REPAIR Right   . SHOULDER SURGERY Left   . SPINE SURGERY  2010   "rod and screws", "failed", lopwer spine,   . stent to heart x 1  2010  . TONSILLECTOMY    . UMBILICAL HERNIA REPAIR     x 1  . varicose vein     stripping  . VIDEO BRONCHOSCOPY Bilateral 10/16/2015   Procedure: VIDEO BRONCHOSCOPY WITHOUT FLUORO;  Surgeon: Brand Males, MD;  Location: [facility];  Service: Cardiopulmonary;  Laterality: Bilateral;   Social History   Occupational History  . Occupation: retired  Tobacco Use  . Smoking status: Never Smoker  . Smokeless tobacco: Never Used  . Tobacco comment: occ wine  Substance and Sexual Activity  . Alcohol use: Yes    Comment: occasional wine- 1-2 per week  . Drug use: No  . Sexual activity: Not Currently    Comment: declined condoms

## 2018-05-18 ENCOUNTER — Other Ambulatory Visit: Payer: Self-pay | Admitting: Orthopedic Surgery

## 2018-05-19 ENCOUNTER — Ambulatory Visit (INDEPENDENT_AMBULATORY_CARE_PROVIDER_SITE_OTHER): Payer: Medicare Other | Admitting: Orthopedic Surgery

## 2018-05-20 ENCOUNTER — Ambulatory Visit (INDEPENDENT_AMBULATORY_CARE_PROVIDER_SITE_OTHER): Payer: Medicare Other | Admitting: Orthopedic Surgery

## 2018-05-20 DIAGNOSIS — E669 Obesity, unspecified: Secondary | ICD-10-CM | POA: Diagnosis not present

## 2018-05-20 DIAGNOSIS — Z6830 Body mass index (BMI) 30.0-30.9, adult: Secondary | ICD-10-CM | POA: Diagnosis not present

## 2018-05-20 DIAGNOSIS — C9110 Chronic lymphocytic leukemia of B-cell type not having achieved remission: Secondary | ICD-10-CM | POA: Diagnosis not present

## 2018-05-20 DIAGNOSIS — Z21 Asymptomatic human immunodeficiency virus [HIV] infection status: Secondary | ICD-10-CM | POA: Diagnosis not present

## 2018-05-20 DIAGNOSIS — J849 Interstitial pulmonary disease, unspecified: Secondary | ICD-10-CM | POA: Diagnosis not present

## 2018-05-20 DIAGNOSIS — M150 Primary generalized (osteo)arthritis: Secondary | ICD-10-CM | POA: Diagnosis not present

## 2018-05-20 DIAGNOSIS — M2550 Pain in unspecified joint: Secondary | ICD-10-CM | POA: Diagnosis not present

## 2018-05-20 DIAGNOSIS — R29818 Other symptoms and signs involving the nervous system: Secondary | ICD-10-CM | POA: Diagnosis not present

## 2018-05-20 DIAGNOSIS — M0609 Rheumatoid arthritis without rheumatoid factor, multiple sites: Secondary | ICD-10-CM | POA: Diagnosis not present

## 2018-05-20 DIAGNOSIS — M1A09X0 Idiopathic chronic gout, multiple sites, without tophus (tophi): Secondary | ICD-10-CM | POA: Diagnosis not present

## 2018-05-20 DIAGNOSIS — B59 Pneumocystosis: Secondary | ICD-10-CM | POA: Diagnosis not present

## 2018-05-21 ENCOUNTER — Ambulatory Visit
Admission: RE | Admit: 2018-05-21 | Discharge: 2018-05-21 | Disposition: A | Discharge status: Home / Self Care | Payer: Medicare Other | Source: Ambulatory Visit | Attending: Orthopedic Surgery | Admitting: Orthopedic Surgery

## 2018-05-21 ENCOUNTER — Encounter: Payer: Self-pay | Admitting: Neurology

## 2018-05-21 ENCOUNTER — Encounter (HOSPITAL_COMMUNITY)
Admission: RE | Admit: 2018-05-21 | Discharge: 2018-05-21 | Disposition: A | Discharge status: Home / Self Care | Payer: Medicare Other | Source: Ambulatory Visit | Attending: Orthopedic Surgery | Admitting: Orthopedic Surgery

## 2018-05-21 DIAGNOSIS — M25562 Pain in left knee: Secondary | ICD-10-CM | POA: Insufficient documentation

## 2018-05-21 DIAGNOSIS — S4992XA Unspecified injury of left shoulder and upper arm, initial encounter: Secondary | ICD-10-CM | POA: Diagnosis not present

## 2018-05-21 DIAGNOSIS — M25512 Pain in left shoulder: Secondary | ICD-10-CM | POA: Diagnosis not present

## 2018-05-21 DIAGNOSIS — M25561 Pain in right knee: Secondary | ICD-10-CM | POA: Diagnosis not present

## 2018-05-21 DIAGNOSIS — S8991XA Unspecified injury of right lower leg, initial encounter: Secondary | ICD-10-CM | POA: Diagnosis not present

## 2018-05-21 DIAGNOSIS — M13812 Other specified arthritis, left shoulder: Secondary | ICD-10-CM

## 2018-05-21 MED ORDER — TECHNETIUM TC 99M MEDRONATE IV KIT
21.7000 | PACK | Freq: Once | INTRAVENOUS | Status: AC | PRN
  Administered 2018-05-21: 21.7 via INTRAVENOUS

## 2018-05-25 ENCOUNTER — Encounter (INDEPENDENT_AMBULATORY_CARE_PROVIDER_SITE_OTHER): Payer: Self-pay | Admitting: Family Medicine

## 2018-05-25 DIAGNOSIS — M25552 Pain in left hip: Secondary | ICD-10-CM

## 2018-05-31 ENCOUNTER — Encounter (INDEPENDENT_AMBULATORY_CARE_PROVIDER_SITE_OTHER): Payer: Self-pay | Admitting: Family Medicine

## 2018-06-02 ENCOUNTER — Encounter (INDEPENDENT_AMBULATORY_CARE_PROVIDER_SITE_OTHER): Payer: Self-pay | Admitting: Orthopedic Surgery

## 2018-06-03 ENCOUNTER — Telehealth (INDEPENDENT_AMBULATORY_CARE_PROVIDER_SITE_OTHER): Payer: Self-pay | Admitting: Family Medicine

## 2018-06-03 ENCOUNTER — Ambulatory Visit
Admission: RE | Admit: 2018-06-03 | Discharge: 2018-06-03 | Disposition: A | Discharge status: Home / Self Care | Payer: Medicare Other | Source: Ambulatory Visit | Attending: Family Medicine | Admitting: Family Medicine

## 2018-06-03 DIAGNOSIS — M1612 Unilateral primary osteoarthritis, left hip: Secondary | ICD-10-CM | POA: Diagnosis not present

## 2018-06-03 DIAGNOSIS — M25552 Pain in left hip: Secondary | ICD-10-CM

## 2018-06-03 NOTE — Telephone Encounter (Signed)
MRI shows no fracture, but severe arthritis (bone on bone) in the left hip.

## 2018-06-04 ENCOUNTER — Other Ambulatory Visit: Payer: Self-pay | Admitting: Infectious Diseases

## 2018-06-04 ENCOUNTER — Telehealth (INDEPENDENT_AMBULATORY_CARE_PROVIDER_SITE_OTHER): Payer: Self-pay | Admitting: *Deleted

## 2018-06-04 ENCOUNTER — Telehealth (INDEPENDENT_AMBULATORY_CARE_PROVIDER_SITE_OTHER): Payer: Self-pay | Admitting: Family Medicine

## 2018-06-04 ENCOUNTER — Other Ambulatory Visit: Payer: Self-pay | Admitting: Physician Assistant

## 2018-06-04 DIAGNOSIS — B20 Human immunodeficiency virus [HIV] disease: Secondary | ICD-10-CM

## 2018-06-04 NOTE — Telephone Encounter (Signed)
Appeal letter for MRI denial dictated.

## 2018-06-04 NOTE — Telephone Encounter (Signed)
I faxed cover letter with appeal and MRI results to pt insurance [employer] [place] of medical neccessity. Pending appeal.

## 2018-06-07 ENCOUNTER — Telehealth (INDEPENDENT_AMBULATORY_CARE_PROVIDER_SITE_OTHER): Payer: Self-pay | Admitting: Orthopedic Surgery

## 2018-06-07 ENCOUNTER — Encounter (INDEPENDENT_AMBULATORY_CARE_PROVIDER_SITE_OTHER): Payer: Self-pay | Admitting: Family Medicine

## 2018-06-07 NOTE — Telephone Encounter (Signed)
Patient ready for shoulder surgery . CT scan done   Need surgery sheet. Thanks

## 2018-06-10 ENCOUNTER — Telehealth (INDEPENDENT_AMBULATORY_CARE_PROVIDER_SITE_OTHER): Payer: Self-pay | Admitting: Orthopedic Surgery

## 2018-06-10 NOTE — Telephone Encounter (Signed)
See prior message

## 2018-06-10 NOTE — Telephone Encounter (Signed)
Debbie, see Dr Randel Pigg note.

## 2018-06-10 NOTE — Telephone Encounter (Signed)
Pls call pt we will schedule for rsa but need to send disc to josh it will be about 4 weeks to do patient specific planning

## 2018-06-10 NOTE — Telephone Encounter (Signed)
Patient called he would like to go over his MRI results over the phone. I did advise patient that our protocol in the office is the come in house to review them. He would like to schedule surgery as soon as possible he is hurting really bad. Please call patient advise

## 2018-06-10 NOTE — Telephone Encounter (Signed)
Please advise 

## 2018-06-13 ENCOUNTER — Encounter (INDEPENDENT_AMBULATORY_CARE_PROVIDER_SITE_OTHER): Payer: Self-pay | Admitting: Orthopedic Surgery

## 2018-06-14 ENCOUNTER — Encounter (INDEPENDENT_AMBULATORY_CARE_PROVIDER_SITE_OTHER): Payer: Self-pay | Admitting: Orthopedic Surgery

## 2018-06-14 NOTE — Telephone Encounter (Signed)
The more powerful pain medicine have before surgery the more pain again to be in after surgery I would start with tramadol or Tylenol 3 1 p.o. 3 times daily #30 with no refills of either 1 of those.  I would really save the more potent stuff or after surgery when he will need it more.

## 2018-06-15 ENCOUNTER — Other Ambulatory Visit (INDEPENDENT_AMBULATORY_CARE_PROVIDER_SITE_OTHER): Payer: Self-pay

## 2018-06-15 ENCOUNTER — Encounter: Payer: Self-pay | Admitting: Allergy & Immunology

## 2018-06-15 MED ORDER — ACETAMINOPHEN-CODEINE #3 300-30 MG PO TABS: 1.0000 | ORAL_TABLET | Freq: Three times a day (TID) | ORAL | 0 refills | Status: DC | PRN

## 2018-06-25 DIAGNOSIS — H04123 Dry eye syndrome of bilateral lacrimal glands: Secondary | ICD-10-CM | POA: Diagnosis not present

## 2018-06-25 DIAGNOSIS — H16213 Exposure keratoconjunctivitis, bilateral: Secondary | ICD-10-CM | POA: Diagnosis not present

## 2018-06-25 DIAGNOSIS — H0520 Unspecified exophthalmos: Secondary | ICD-10-CM | POA: Diagnosis not present

## 2018-06-25 DIAGNOSIS — H11823 Conjunctivochalasis, bilateral: Secondary | ICD-10-CM | POA: Diagnosis not present

## 2018-06-28 LAB — HM DIABETES EYE EXAM

## 2018-07-01 ENCOUNTER — Other Ambulatory Visit: Payer: Medicare Other

## 2018-07-01 ENCOUNTER — Encounter: Payer: Self-pay | Admitting: Internal Medicine

## 2018-07-01 ENCOUNTER — Ambulatory Visit (INDEPENDENT_AMBULATORY_CARE_PROVIDER_SITE_OTHER): Payer: Medicare Other | Admitting: Internal Medicine

## 2018-07-01 VITALS — BP 138/82 | HR 72 | Temp 97.8°F | Resp 16 | Ht 69.0 in | Wt 202.0 lb

## 2018-07-01 DIAGNOSIS — L309 Dermatitis, unspecified: Secondary | ICD-10-CM | POA: Insufficient documentation

## 2018-07-01 DIAGNOSIS — E5111 Dry beriberi: Secondary | ICD-10-CM

## 2018-07-01 DIAGNOSIS — R531 Weakness: Secondary | ICD-10-CM

## 2018-07-01 DIAGNOSIS — I10 Essential (primary) hypertension: Secondary | ICD-10-CM | POA: Diagnosis not present

## 2018-07-01 DIAGNOSIS — B3789 Other sites of candidiasis: Secondary | ICD-10-CM | POA: Diagnosis not present

## 2018-07-01 MED ORDER — EPICERAM EX EMUL: 1.0000 | Freq: Two times a day (BID) | CUTANEOUS | 0 refills | Status: DC

## 2018-07-01 MED ORDER — FLUCONAZOLE 100 MG PO TABS: 100.0000 mg | ORAL_TABLET | Freq: Every day | ORAL | 0 refills | Status: AC

## 2018-07-01 NOTE — Patient Instructions (Signed)
Jock Itch Jock itch (tinea cruris) is an infection of the skin in the groin area. It is caused by a fungus, which is a type of germ that lives in dark, damp places. Jock itch causes an itchy rash in the groin and upper thigh area. It usually goes away in 2-3 weeks with treatment. What are the causes? The fungus that causes jock itch may be spread by:  Touching a fungus infection elsewhere on your body, such as athlete's foot, and then touching your groin area.  Sharing towels or clothing, such as socks or shoes, with someone who has a fungal infection. What increases the risk? Jock itch is most common in men and adolescent boys. You are also more likely to develop the condition if you:  Are in a hot, humid climate.  Wear tight-fitting clothing or wet bathing suits for long periods of time.  Play sports.  Are overweight.  Have diabetes.  Have a weakened immune system.  Sweat a lot. What are the signs or symptoms? Symptoms of jock itch may include:  A red, pink, or brown rash in the groin area. Blisters may be present. The rash may spread to the thighs, anus, and buttocks.  Dry and scaly skin on or around the rash.  Itchiness. How is this diagnosed? In most cases, your health care provider can make the diagnosis by looking at your rash. In some cases, a sample of infected skin may be scraped off. This sample may be examined under a microscope (biopsy) or by trying to grow the fungus from the sample (culture). How is this treated? Treatment for this condition may include:  Antifungal medicine to kill the fungus. This may be a skin cream, ointment, or powder, or it may be a medicine that you take by mouth.  Skin cream or ointment to reduce itching.  Lifestyle changes, such as wearing looser clothing and caring for your skin. Follow these instructions at home: Skin care  Apply skin creams, ointments, or powders exactly as told by your health care provider.  Wear  loose-fitting clothing that does not rub against your groin area. Men should wear boxer shorts or loose-fitting underwear.  Keep your groin area clean and dry. ? Change your underwear every day. ? Change out of wet bathing suits as soon as possible. ? After bathing, use a separate towel to dry your groin area thoroughly and gently. Using a separate towel will help prevent spreading the infection to other areas of your body.  Avoid hot baths and showers. Hot water can make itching worse.  Do not scratch the affected area. General instructions  Take and apply over-the-counter and prescription medicines only as told by your health care provider.  Do not share towels, clothing, or personal items with other people.  Wash your hands often with soap and water, especially after touching your groin area. If soap and water are not available, use alcohol-based hand sanitizer. Contact a health care provider if:  Your rash: ? Gets worse or does not get better after 2 weeks of treatment. ? Spreads. ? Returns after treatment is finished.  You have any of the following: ? A fever. ? New or worsening redness, swelling, or pain around your rash. ? Fluid, blood, or pus coming from your rash. Summary  Jock itch (tinea cruris) is a fungal infection of the skin in the groin area.  The fungus can be spread by sharing clothing or by touching a fungus infection elsewhere on your body and   your rash.  ? Fluid, blood, or pus coming from your rash.  Summary  · Jock itch (tinea cruris) is a fungal infection of the skin in the groin area.  · The fungus can be spread by sharing clothing or by touching a fungus infection elsewhere on your body and then touching your groin area.  · Treatment may include antifungal medicine and lifestyle changes, such as keeping the area clean and dry.  This information is not intended to replace advice given to you by your health care provider. Make sure you discuss any questions you have with your health care provider.  Document Released: 04/18/2002 Document Revised: 04/08/2017 Document Reviewed: 04/08/2017  Elsevier Interactive Patient Education © 2019 [employer]

## 2018-07-01 NOTE — Progress Notes (Signed)
Subjective:  Patient ID: [name], male    DOB: 1950/10/15  Age: 68 y.o. MRN: [id number]  CC: Rash   HPI [name] presents for f/up - He complains of a several week history of rash on his scrotum.  He describes it as a redness that does not itch much.  He also has a rash on his left upper posterior back that causes itching.  He has not treated either 1 of these.  He tells me he recently saw an ophthalmologist for puffy/watery eyes and there is concern that he may have Graves' disease.  He also complains of weakness and frequent falls and tells me he is seeing a neurologist soon.  He recently saw an orthopedist and is planning on having shoulder surgery.  Outpatient Medications Prior to Visit  Medication Sig Dispense Refill  . acetaminophen-codeine (TYLENOL #3) 300-30 MG tablet Take 1 tablet by mouth every 8 (eight) hours as needed for moderate pain. 30 tablet 0  . atorvastatin (LIPITOR) 10 MG tablet Take 1 tablet (10 mg total) by mouth at bedtime. 30 tablet 11  . clindamycin (CLEOCIN) 150 MG capsule TAKE 4 CAPSULES BY MOUTH ONE FOUR BEFORE APPOINTMENT  2  . [facility] B-1 100 MG tablet TAKE 1 TABLET BY MOUTH EVERY DAY 90 tablet 0  . darunavir (PREZISTA) 800 MG tablet TAKE 1 TABLET (800 MG TOTAL) BY MOUTH DAILY. 90 tablet 1  . elvitegravir-cobicistat-emtricitabine-tenofovir (GENVOYA) 150-150-200-10 MG TABS tablet Take 1 tablet by mouth daily with breakfast. 90 tablet 1  . escitalopram (LEXAPRO) 10 MG tablet TAKE 1 TABLET (10 MG TOTAL) BY MOUTH DAILY. 90 tablet 1  . folic acid (FOLVITE) 1 MG tablet Take 1 mg by mouth daily.  11  . furosemide (LASIX) 20 MG tablet Take 1 tablet (20 mg total) by mouth 2 (two) times daily. 90 tablet 3  . leflunomide (ARAVA) 10 MG tablet Take 10 mg by mouth daily.  2  . metoprolol succinate (TOPROL-XL) 25 MG 24 hr tablet TAKE 1 TABLET BY MOUTH EVERY DAY 90 tablet 3  . predniSONE (DELTASONE) 5 MG tablet Take 5 mg by mouth daily.  1  . pregabalin (LYRICA) 200  MG capsule Take 1 capsule (200 mg total) by mouth 2 (two) times daily. 180 capsule 1  . REXULTI 0.25 MG TABS Take 1 tablet by mouth daily.  2  . SAVAYSA 30 MG TABS tablet TAKE 1 TABLET (30 MG TOTAL) BY MOUTH DAILY. 90 tablet 1  . sulfamethoxazole-trimethoprim (BACTRIM DS,SEPTRA DS) 800-160 MG tablet TAKE 1 TABLET BY MOUTH EVERY 12 (TWELVE) HOURS. 60 tablet 3  . valACYclovir (VALTREX) 500 MG tablet Take 1 tablet (500 mg total) by mouth daily. 90 tablet 3  . nitroGLYCERIN (NITROSTAT) 0.4 MG SL tablet Place 1 tablet (0.4 mg total) under the tongue every 5 (five) minutes as needed for chest pain. 25 tablet 2  . famotidine (PEPCID) 40 MG tablet TAKE 1 TABLET BY MOUTH TWICE DAILY FOR 2 MONTHS. 60 tablet 1   No facility-administered medications prior to visit.     ROS Review of Systems  Constitutional: Positive for fatigue. Negative for appetite change, diaphoresis and fever.  HENT: Negative for sore throat and trouble swallowing.   Eyes: Negative.  Negative for photophobia and visual disturbance.  Respiratory: Negative for cough, chest tightness and shortness of breath.   Cardiovascular: Negative for chest pain, palpitations and leg swelling.  Gastrointestinal: Negative for abdominal pain, constipation, diarrhea, nausea and vomiting.  Genitourinary: Negative.  Negative for difficulty urinating.  Musculoskeletal: Positive for arthralgias and back pain. Negative for myalgias and neck pain.  Skin: Positive for rash. Negative for color change.  Neurological: Positive for weakness.  Hematological: Negative for adenopathy. Does not bruise/bleed easily.  Psychiatric/Behavioral: Negative.     Objective:  BP 138/82 (BP Location: Left Arm, Patient Position: Sitting, Cuff Size: Normal)   Pulse 72   Temp 97.8 F (36.6 C) (Oral)   Resp 16   Ht 5\' 9"  (1.753 m)   Wt 202 lb (91.6 kg)   SpO2 97%   BMI 29.83 kg/m   BP Readings from Last 3 Encounters:  07/01/18 138/82  05/13/18 (!) 142/84  03/23/18  121/65    Wt Readings from Last 3 Encounters:  07/01/18 202 lb (91.6 kg)  05/13/18 197 lb 8 oz (89.6 kg)  03/23/18 194 lb (88 kg)    Physical Exam Vitals signs reviewed.  Constitutional:      Appearance: He is not ill-appearing or diaphoretic.  HENT:     Nose: Nose normal. No congestion or rhinorrhea.     Mouth/Throat:     Mouth: Mucous membranes are moist.     Pharynx: Oropharynx is clear. No oropharyngeal exudate or posterior oropharyngeal erythema.  Eyes:     General: No scleral icterus.    Extraocular Movements:     Right eye: Normal extraocular motion and no nystagmus.     Left eye: Normal extraocular motion and no nystagmus.     Conjunctiva/sclera: Conjunctivae normal.  Neck:     Musculoskeletal: Normal range of motion and neck supple. No muscular tenderness.  Cardiovascular:     Rate and Rhythm: Normal rate and regular rhythm.     Heart sounds: No murmur. No gallop.   Pulmonary:     Effort: Pulmonary effort is normal. No respiratory distress.     Breath sounds: Normal breath sounds. No stridor. No wheezing or rales.  Abdominal:     General: Abdomen is flat. Bowel sounds are normal.     Palpations: There is no mass.     Tenderness: There is no abdominal tenderness. There is no guarding.  Musculoskeletal: Normal range of motion.        General: No swelling.     Right lower leg: No edema.     Left lower leg: No edema.  Lymphadenopathy:     Cervical: No cervical adenopathy.  Skin:    Findings: Erythema and rash present.     Comments: There is mild diffuse erythema over the scrotum.  There are no specific epidermal lesions.  Over the left upper back there are large patches of severe xerosis with no specific epidermal lesions.  Neurological:     Mental Status: He is oriented to person, place, and time. Mental status is at baseline.     Cranial Nerves: Cranial nerves are intact. No cranial nerve deficit.     Sensory: Sensation is intact.     Motor: Weakness, atrophy  and abnormal muscle tone present. No tremor.     Coordination: Coordination is intact.     Deep Tendon Reflexes: Reflexes normal. Babinski sign absent on the right side. Babinski sign absent on the left side.  Psychiatric:        Mood and Affect: Mood normal.        Behavior: Behavior normal.     Lab Results  Component Value Date   WBC 14.8 (H) 05/13/2018   HGB 13.6 05/13/2018   HCT 42.2 05/13/2018  PLT 117 (L) 05/13/2018   GLUCOSE 99 05/13/2018   CHOL 146 01/07/2018   TRIG 82 01/07/2018   HDL 46 01/07/2018   LDLCALC 83 01/07/2018   ALT 33 05/13/2018   AST 26 05/13/2018   NA 140 05/13/2018   K 4.0 05/13/2018   CL 103 05/13/2018   CREATININE 0.84 05/13/2018   BUN 16 05/13/2018   CO2 29 05/13/2018   TSH 1.11 07/01/2018   INR 1.61 11/19/2017   HGBA1C 5.2 12/18/2015    Mr Hip Left W/o Contrast  Result Date: 06/03/2018 CLINICAL DATA:  Left hip pain since a fall 1.5 months ago. Initial encounter. EXAM: MR OF THE LEFT HIP WITHOUT CONTRAST TECHNIQUE: Multiplanar, multisequence MR imaging was performed. No intravenous contrast was administered. COMPARISON:  CT abdomen and pelvis 05/13/2016. FINDINGS: Bones: The patient has avascular necrosis of the femoral heads bilaterally, much worse on the left where there is some flattening of subchondral bone and secondary marrow edema in the head and neck of the femur. No fracture or dislocation. Subchondral edema is present in the left acetabulum superiorly and posteriorly. Artifact from lower lumbar fusion hardware noted. Articular cartilage and labrum Articular cartilage: Bone-on-bone joint space narrowing in the left hip is identified. Labrum: The anterior labrum is difficult to visualize due to patient motion. He could not tolerate further scanning. The superior left labrum is diffusely degenerated and torn. Joint or bursal effusion Joint effusion:  Moderate effusion on the left. Bursae: Small amount of fluid in the trochanteric bursa is  slightly greater on the left. Muscles and tendons Muscles and tendons:  Intact. Other findings Miscellaneous:   None. IMPRESSION: Negative for fracture or other acute abnormality. Left much worse than right avascular necrosis of the femoral heads with flattening of the left femoral head and secondary marrow edema in the head and neck consistent with stress change. Severe left hip osteoarthritis where there is bone-on-bone joint space narrowing and fairly extensive subchondral edema in the superior and posterior left acetabulum. Associated degenerative tearing of the left acetabular labrum is seen. Very small amount of fluid in the trochanteric bursa bilaterally compatible with bursitis, more notable on the left. Electronically Signed   By: Inge Rise M.D.   On: 06/03/2018 11:42    Assessment & Plan:   [name] was seen today for rash.  Diagnoses and all orders for this visit:  Thiamine deficiency neuropathy- I will monitor his thiamine level. -     Vitamin B1; Future  Essential hypertension- His blood pressure is adequately well controlled.  Weakness generalized- I will screen him for thyroid disease and MG.  -     Thyroid Panel With TSH; Future -     Acetylcholine receptor, binding; Future -     Striated muscle antibody; Future -     Thyrotropin receptor autoabs; Future  Eczema, unspecified type -     Dermatological Products, Misc. [facility]) lotion; Apply 1 Act topically 2 (two) times daily with a meal.  Candida rash of groin -     fluconazole (DIFLUCAN) 100 MG tablet; Take 1 tablet (100 mg total) by mouth daily for 7 days.   I have discontinued Qais L. Koopmann "Phil"'s famotidine. I am also having him start on EPICERAM and fluconazole. Additionally, I am having him maintain his clindamycin, predniSONE, folic acid, nitroGLYCERIN, metoprolol succinate, SAVAYSA, valACYclovir, darunavir, elvitegravir-cobicistat-emtricitabine-tenofovir, furosemide, escitalopram,  sulfamethoxazole-trimethoprim, pregabalin, REXULTI, leflunomide, atorvastatin, [facility] B-1, and acetaminophen-codeine.  Meds ordered this encounter  Medications  . Dermatological Products, Misc. [facility])  lotion    Sig: Apply 1 Act topically 2 (two) times daily with a meal.    Dispense:  100 g    Refill:  0  . fluconazole (DIFLUCAN) 100 MG tablet    Sig: Take 1 tablet (100 mg total) by mouth daily for 7 days.    Dispense:  7 tablet    Refill:  0     Follow-up: Return in about 4 weeks (around 07/29/2018).  [name], MD

## 2018-07-02 ENCOUNTER — Other Ambulatory Visit: Payer: Self-pay | Admitting: Ophthalmology

## 2018-07-02 DIAGNOSIS — H0520 Unspecified exophthalmos: Secondary | ICD-10-CM

## 2018-07-02 LAB — THYROTROPIN RECEPTOR AUTOABS: Thyrotropin Receptor Ab: <1.1 IU/L (ref 0.00–1.75)

## 2018-07-06 LAB — STRIATED MUSCLE ANTIBODY: STRIATED MUSCLE AB SCREEN: NEGATIVE

## 2018-07-06 LAB — THYROID PANEL WITH TSH
Free Thyroxine Index: 2.2 (ref 1.4–3.8)
T3 Uptake: 33 % (ref 22–35)
T4, Total: 6.6 ug/dL (ref 4.9–10.5)
TSH: 1.11 mIU/L (ref 0.40–4.50)

## 2018-07-06 LAB — ACETYLCHOLINE RECEPTOR, BINDING: A CHR BINDING ABS: <0.3 nmol/L

## 2018-07-06 LAB — VITAMIN B1: Vitamin B1 (Thiamine): 112 nmol/L — ABNORMAL HIGH (ref 8–30)

## 2018-07-07 ENCOUNTER — Encounter: Payer: Self-pay | Admitting: Internal Medicine

## 2018-07-08 ENCOUNTER — Other Ambulatory Visit: Payer: Self-pay | Admitting: Internal Medicine

## 2018-07-08 ENCOUNTER — Encounter: Payer: Self-pay | Admitting: Internal Medicine

## 2018-07-08 DIAGNOSIS — B3789 Other sites of candidiasis: Secondary | ICD-10-CM

## 2018-07-08 MED ORDER — CICLOPIROX OLAMINE 0.77 % EX CREA: TOPICAL_CREAM | Freq: Two times a day (BID) | CUTANEOUS | 1 refills | Status: DC

## 2018-07-13 ENCOUNTER — Telehealth (INDEPENDENT_AMBULATORY_CARE_PROVIDER_SITE_OTHER): Payer: Self-pay | Admitting: Orthopedic Surgery

## 2018-07-13 NOTE — Telephone Encounter (Signed)
Please review and advise. Thanks.  

## 2018-07-13 NOTE — Telephone Encounter (Signed)
Patient scheduled for left reverse shoulder replacement at [facility] 08-10-18.  Post op set for 08-26-18.  [facility] contacted. CPM order faxed to [facility]. Demographics are in [facility] folder.  A request for medical clearance faxed to Dr. [name], including request to provide perioperative instructions for [facility].  Patient aware of location, date and time.    Are there any other clearances to be obtained?  Please advise  Patient is requesting to recover at a SNF. ([facility] to be specific).

## 2018-07-14 NOTE — Telephone Encounter (Signed)
The main thing with [name] is I do not know if he will actually fit in the chair we have for doing this procedure so he needs to come in so I can measure the distance that he is had would be from a beachchair positioner so he needs to come in at some point to get that measurement made thanks

## 2018-07-15 ENCOUNTER — Encounter: Payer: Self-pay | Admitting: Allergy & Immunology

## 2018-07-15 ENCOUNTER — Ambulatory Visit (INDEPENDENT_AMBULATORY_CARE_PROVIDER_SITE_OTHER): Payer: Medicare Other | Admitting: Allergy & Immunology

## 2018-07-15 VITALS — BP 150/90 | HR 84 | Resp 16

## 2018-07-15 DIAGNOSIS — T781XXD Other adverse food reactions, not elsewhere classified, subsequent encounter: Secondary | ICD-10-CM

## 2018-07-15 MED ORDER — EPINEPHRINE PF 1 MG/ML IJ SOLN: 0.3000 mg | Freq: Once | INTRAMUSCULAR | Status: DC

## 2018-07-15 MED ORDER — EPINEPHRINE (ANAPHYLAXIS) 1 MG/ML IJ SOLN
0.3000 mg | Freq: Once | INTRAMUSCULAR | Status: AC
  Administered 2018-07-15: 0.3 mg via INTRAMUSCULAR

## 2018-07-15 NOTE — Telephone Encounter (Signed)
IC appt scheduled for this.

## 2018-07-15 NOTE — Progress Notes (Signed)
FOLLOW UP  Date of Service/Encounter:  07/15/18   Assessment:   Adverse food reaction (peanuts) - failed peanut challenge  Mr. Shi unfortunately failed his peanut challenge today.  He did get several steps in, which is reassuring, but then developed worsening oral irritation with a sensation of being "out of it".  He also was hypoxic.  However, he responded very well to epinephrine, prednisone, and cetirizine.  We did watch him for approximately 1 hour and 15 minutes following the episode.  His daughter also came to take care of him.  Despite this, he was very interested in starting the peanut drug which was recently FDA approved.  However, I warned him that this is not even covered by Medicare at this point and we need to go through some training to learn how to prescribe it.  Plan/Recommendations:   1. Adverse food reaction - You did not tolerate the peanut challenge today. - I would continue to avoid peanuts. - We could discuss Palforzia in the future, but I am not even sure that Medicare is going to cover it.   2. Return if symptoms worsen or fail to improve.   Subjective:   ELMOR KOST is a 68 y.o. male presenting today for follow up of  Chief Complaint  Patient presents with  . Food/Drug Challenge    peanut butter    [name] has a history of the following: Patient Active Problem List   Diagnosis Date Noted  . Eczema 07/01/2018  . Candida rash of groin 07/01/2018  . Tinea corporis 01/22/2018  . Depression with anxiety 01/14/2018  . 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 Carotid artery disease ([employer]) 11/13/2016  . Stenosis of carotid artery 11/13/2016  . Spinal stenosis of thoracolumbar region 07/02/2016  . ILD (interstitial lung disease) ([place]) 09/13/2015  . Immunocompromised state ([place]) 09/13/2015  . PCP (pneumocystis jiroveci pneumonia) ([place]) 06/18/2015  . Postinflammatory pulmonary fibrosis ([place]) 05/11/2015  . GERD (gastroesophageal reflux  disease)   . Hereditary and idiopathic peripheral neuropathy 09/08/2013  . Erosive esophagitis 12/28/2012  . Allergic rhinitis, cause unspecified 04/28/2012  . Long term current use of anticoagulant therapy 02/03/2012  . Hypertension   . DVT, lower extremity, recurrent ([place])   . Gout   . Carotid artery occlusion   . Hyperlipidemia with target LDL less than 100   . HIV infection ([place]) 04/08/2011  . Arthritis, rheumatoid ([place]) 04/08/2011  . Chronic lymphoblastic leukemia 04/08/2011  . Impotence of organic origin 04/02/2011  . CAD (coronary artery disease) of artery bypass graft 02/14/2009  . Herpes genitalis 06/22/2008    History obtained from: chart review and patient.  Valentino is a 68 y.o. male presenting for a food challenge.  He was last seen in September 2019.  At that time, we tested for peanuts and tree nuts in the blood.  This was all negative.  Therefore, he presents for a peanut challenge. He is very interested in introducing this back into his diet.   He is otherwise feeling well without evidence of a viral illness. He brought in a jar of peanut butter for the challenge today.   Otherwise, there have been no changes to his past medical history, surgical history, family history, or social history.    Review of Systems  Constitutional: Negative.  Negative for fever, malaise/fatigue and weight loss.  HENT: Negative.  Negative for congestion, ear discharge and ear pain.   Eyes: Negative for pain, discharge and redness.  Respiratory: Negative for  cough, sputum production, shortness of breath and wheezing.   Cardiovascular: Negative.  Negative for chest pain and palpitations.  Gastrointestinal: Negative for abdominal pain and heartburn.  Musculoskeletal: Positive for back pain and joint pain.  Skin: Negative.  Negative for itching and rash.  Neurological: Negative for dizziness and headaches.  Endo/Heme/Allergies: Negative for environmental allergies. Does not bruise/bleed  easily.       Objective:   Blood pressure (!) 150/90, pulse 84, resp. rate 16, SpO2 95 %. There is no height or weight on file to calculate BMI.   Physical Exam:  Physical Exam  Constitutional: He appears well-developed.  HENT:  Head: Normocephalic and atraumatic.  Right Ear: Tympanic membrane, external ear and ear canal normal.  Left Ear: Tympanic membrane and ear canal normal.  Nose: No mucosal edema, rhinorrhea, nasal deformity or septal deviation. No epistaxis. Right sinus exhibits no maxillary sinus tenderness and no frontal sinus tenderness. Left sinus exhibits no maxillary sinus tenderness and no frontal sinus tenderness.  Mouth/Throat: Uvula is midline and oropharynx is clear and moist. Mucous membranes are not pale and not dry.  Eyes: Pupils are equal, round, and reactive to light. Conjunctivae and EOM are normal. Right eye exhibits no chemosis and no discharge. Left eye exhibits no chemosis and no discharge. Right conjunctiva is not injected. Left conjunctiva is not injected.  Cardiovascular: Normal rate, regular rhythm and normal heart sounds.  Respiratory: Effort normal and breath sounds normal. No accessory muscle usage. No tachypnea. No respiratory distress. He has no wheezes. He has no rhonchi. He has no rales. He exhibits no tenderness.  Musculoskeletal:     Comments: Baseline rheumatoid arthritis.  Lymphadenopathy:    He has no cervical adenopathy.  Neurological: He is alert.  Skin: No abrasion, no petechiae and no rash noted. Rash is not papular, not vesicular and not urticarial. No erythema. No pallor.  Psychiatric: He has a normal mood and affect.     Diagnostic studies:   Open graded peanut butter oral challenge: The patient was able to tolerate the challenge today without adverse signs or symptoms. Vital signs were stable throughout the challenge and observation period. He received multiple doses separated by 20 minutes, each of which was separated by vitals  and a brief physical exam. He received the following doses: lip rub, 1 gm, 2 gm and 4 gm. However, at this dose he developed worsening oral itching/irritation and he reported an out of body experience. We administered epinephrine as well as prednisone 60mg  and cetirizine 75mL. He was monitored for 75 minutes following the reaction. See scanned flowsheet for more information.  Time of arrival: 9:00 AM Time of discharge: 12:06 PM      Salvatore Marvel, MD  [facility]

## 2018-07-15 NOTE — Addendum Note (Signed)
Addended by: Lucrezia Starch I on: 07/15/2018 07:05 PM   Modules accepted: Orders

## 2018-07-15 NOTE — Patient Instructions (Addendum)
1. Adverse food reaction, subsequent encounter - You did not tolerate the peanut challenge today. - I would continue to avoid peanuts. - We could discuss Palforzia in the future, but I am not even sure that Medicare is going to cover it.   2. Return if symptoms worsen or fail to improve.   Please inform us of any Emergency Department visits, hospitalizations, or changes in symptoms. Call us before going to the ED for breathing or allergy symptoms since we might be able to fit you in for a sick visit. Feel free to contact us anytime with any questions, problems, or concerns.  It was a pleasure to see you again today!  Websites that have reliable patient information: 1. American Academy of Asthma, Allergy, and Immunology: www.aaaai.org 2. Food Allergy Research and Education (FARE): foodallergy.org 3. Mothers of Asthmatics: http://www.asthmacommunitynetwork.org 4. American College of Allergy, Asthma, and Immunology: MonthlyElectricBill.co.uk   Make sure you are registered to vote! If you have moved or changed any of your contact information, you will need to get this updated before voting!    Voter ID laws are NOT going into effect for the General Election in November 2020! DO NOT let this stop you from exercising your right to vote!

## 2018-07-16 ENCOUNTER — Ambulatory Visit
Admission: RE | Admit: 2018-07-16 | Discharge: 2018-07-16 | Disposition: A | Discharge status: Home / Self Care | Payer: Medicare Other | Source: Ambulatory Visit | Attending: Ophthalmology | Admitting: Ophthalmology

## 2018-07-16 ENCOUNTER — Telehealth: Payer: Self-pay | Admitting: *Deleted

## 2018-07-16 DIAGNOSIS — H0520 Unspecified exophthalmos: Secondary | ICD-10-CM

## 2018-07-16 MED ORDER — GADOBENATE DIMEGLUMINE 529 MG/ML IV SOLN
18.0000 mL | Freq: Once | INTRAVENOUS | Status: AC | PRN
  Administered 2018-07-16: 18 mL via INTRAVENOUS

## 2018-07-16 NOTE — Telephone Encounter (Signed)
Called patient and left voicemail message to call office.  Called [name] (daughter) with whom we were given approval by patient to contact at time of visit on 07/15/18 to contact for follow up and [name] states her dad did well yesterday with no issues.  [name] is doing well this morning and out doing errands.  [name] is aware to contact the office if he does have any issues going forward.

## 2018-07-19 ENCOUNTER — Other Ambulatory Visit: Payer: Self-pay | Admitting: Internal Medicine

## 2018-07-19 ENCOUNTER — Encounter: Payer: Self-pay | Admitting: Internal Medicine

## 2018-07-20 NOTE — Telephone Encounter (Signed)
Please review

## 2018-07-20 NOTE — Telephone Encounter (Signed)
Patient has received medical clearance for left reverse shoulder arthroplasty on 08-10-18 from PCP-Thomas [name]. Patient is at [facility] for this procedure.  He may hold Savaysa  3 days before surgery and restart when risk of bleeding is over.

## 2018-07-26 NOTE — Telephone Encounter (Signed)
Need to see him before surgery because I have to measure his head distance from the seat/beachchair positioner

## 2018-07-27 ENCOUNTER — Telehealth (INDEPENDENT_AMBULATORY_CARE_PROVIDER_SITE_OTHER): Payer: Self-pay | Admitting: Orthopedic Surgery

## 2018-07-27 ENCOUNTER — Other Ambulatory Visit: Payer: Self-pay | Admitting: Internal Medicine

## 2018-07-27 DIAGNOSIS — M792 Neuralgia and neuritis, unspecified: Secondary | ICD-10-CM

## 2018-07-27 NOTE — Telephone Encounter (Signed)
He has an appt for this on 03/23

## 2018-07-27 NOTE — Telephone Encounter (Signed)
Patient has an appointment on 08-02-18 for measurements (distance from head to beach chair positioner), but is calling now to say that he would like to proceed with surgery and if there is anyway to have it done sooner than 08-10-18 it would be a go for him.  Please advise if patient can/should be moved to a sooner date.

## 2018-07-27 NOTE — Progress Notes (Addendum)
NEUROLOGY CONSULTATION NOTE  PAGE RAMAGLIA MRN: [id number] DOB: 05/16/1990  Referring provider: Alben Deeds, MD Primary care provider: Sanda Linger, MD  Reason for consult:  Balance problems, mental fog.  HISTORY OF PRESENT ILLNESS: [name] is a 68 year old right-handed Caucasian man with rheumatoid arthritis, HIV, peripheral neuropathy and history of TIA, recurrent DVT and lumbar spine surgery who presents for balance problems. History supplemented by referring provider note.  Over the years, he has endorsed multiple symptomology.  He has rheumatoid arthritis and lumbar spine disease, status post surgery.  He also has longstanding peripheral neuropathy.  He has chronic pain.  He also has known depression and anxiety.  He has history of baseline left sided weakness.  He came and saw Dr. Smiley Houseman for several issues.  He had difficulty swallowing, however swallowing studies have not showed any mechanical problems.  He was also concerned about his memory, as he was often losing his train of thought.  Another issue was is balance and gait.  Dr. Smiley Houseman suspected memory problems were multifactorial, and balance problems were due to worsening arthritis and sequela of lumbar surgery.  MRI of brain was performed on 07/14/12 to rule out a new stroke, which revealed mild atrophy and white matter changes, but no acute or subacute infarcts.    He also has had episodes where he would suddenly have a sense of shutting down, like "a motor running and then switched off."  This is accompanied by a feeling of impending doom.  It usually lasts a couple of seconds but recently it occurred while in the car and lasted longer, causing him to pull over.  There is no loss of consciousness or headache.  He also continues to stumble, although he has not had any falls.  He reports excessive drooling.    Regarding memory, it has been a progressive problem over several years.  He reports that he had an outside  neuropsychological test several years ago at [facility].  He said that he was told he was slow to respond, but doesn't know the exact results of the test.  He reports memory problems as well as processing information.  He now has difficulty performing crossword puzzles.  He forgets appointments and once drove to the wrong clinic for an appointment.  If he is going out to the mailbox and gets side-tracked, he will then forget to go to the mailbox.  He also has forgotten the name of his dog.  He calls his car "Malena Catholic" and has at times forgotten that name.  When he writes a check, he will misprint the date anywhere from the 1950s to the present.  He has not gotten lost while driving.  He lives alone and is able to perform all his ADLs.  Neuropsychological testing was performed in September 2014  and did not reveal any organic cognitive impairment, and reduced speed of processing and attentional capacity more likely related to anxiety and depression.  He reports worsening balance over the past year.  He reports increased stumbling and falling.  He states his legs cramp and feel weak and numb.  He is concerned that he may have ALS.  He endorses mental fog.  He endorses worsening diffuse joint pain.  He had an MRI of the left hip in January which demonstrated severe arthritis.  He also endorsed diffuse weakness and blurred or double vision.  He previously had thiamine deficiency but recent B1 level was 112.  He also reports double vision or  blurred vision when watching TV.  He was evaluated by Dr. Dione Booze of ophthalmology who reportedly did not find anything significant.  He states food gets caught and saliva often goes "down the wrong pipe."  Thyroid panel and Myasthenia panel were normal.  MRI of brain and orbits with and without contrast from 07/17/18 were personally reviewed and were normal.  Serum glucose has been normal.  He last had physical therapy over a year ago.  For pain, he takes Lyrica, Tylenol #3.  He takes  Lexapro for depression and anxiety.  He takes atorvastatin for cholesterol.  PAST MEDICAL HISTORY: Past Medical History:  Diagnosis Date  . Allergy   . Anxiety   . Carotid artery occlusion    40-60% right ICA stenosis (09/2008)  . Cataract   . Chronic back pain   . CLL (chronic lymphoblastic leukemia) dx 2010   Followed at [facility] q50mo, no current therapy   . Clotting disorder (HCC)   . Coronary artery disease 2010   s/p CABG '10, sees Dr. Antoine Poche  . Depression   . Diverticulosis   . DVT, lower extremity, recurrent (HCC) 2008, 2009   LLE, chronic anticoag since 2009  . Esophagitis   . Fibromyalgia   . Gallstones   . GERD (gastroesophageal reflux disease)   . Gout   . Gynecomastia, male   . H/O hiatal hernia 2008   surgery  . Hemorrhoids   . Hepatitis A yrs ago  . HIV infection (HCC) dx 1993  . Hypertension   . Impotence of organic origin   . Myocardial infarction (HCC) 2010    x 2  . Neuromuscular disorder (HCC)    neuropathy  . Osteoarthritis, knee    s/p B TKA  . Osteoporosis   . Pneumonia mrach, may, july 2017  . Rheumatoid arthritis(714.0) dx 2010   MTX, follows with rheum  . Seasonal allergies   . Secondary syphilis 07/24/14 dx   s/p 2 wks doxy  . Status post dilation of esophageal narrowing   . Stroke (HCC) 1997   tia   . TIA (transient ischemic attack) 1997   mild residual L mouth droop  . Tubular adenoma of colon     PAST SURGICAL HISTORY: Past Surgical History:  Procedure Laterality Date  . CHOLECYSTECTOMY    . COLONOSCOPY WITH PROPOFOL N/A 12/28/2012   Procedure: COLONOSCOPY WITH PROPOFOL;  Surgeon: Beverley Fiedler, MD;  Location: [facility];  Service: Gastroenterology;  Laterality: N/A;  . CORONARY ARTERY BYPASS GRAFT  2010   triple bypass  . ESOPHAGOGASTRODUODENOSCOPY (EGD) WITH PROPOFOL N/A 12/28/2012   Procedure: ESOPHAGOGASTRODUODENOSCOPY (EGD) WITH PROPOFOL;  Surgeon: Beverley Fiedler, MD;  Location: [facility];  Service: Gastroenterology;   Laterality: N/A;  . ESOPHAGOGASTRODUODENOSCOPY (EGD) WITH PROPOFOL N/A 03/15/2013   Procedure: ESOPHAGOGASTRODUODENOSCOPY (EGD) WITH PROPOFOL;  Surgeon: Beverley Fiedler, MD;  Location: [facility];  Service: Gastroenterology;  Laterality: N/A;  . ESOPHAGOGASTRODUODENOSCOPY (EGD) WITH PROPOFOL N/A 02/07/2016   Procedure: ESOPHAGOGASTRODUODENOSCOPY (EGD) WITH PROPOFOL;  Surgeon: Beverley Fiedler, MD;  Location: [facility];  Service: Gastroenterology;  Laterality: N/A;  . HARDWARE REMOVAL N/A 07/02/2012   Procedure: HARDWARE REMOVAL;  Surgeon: Mariam Dollar, MD;  Location: [facility];  Service: Neurosurgery;  Laterality: N/A;  . HIATAL HERNIA REPAIR     wrap  . INGUINAL HERNIA REPAIR Bilateral   . JOINT REPLACEMENT Left 1999  . KNEE ARTHROPLASTY  07/22/2011   Procedure: COMPUTER ASSISTED TOTAL KNEE ARTHROPLASTY;  Surgeon: Cammy Copa,  MD;  Location: [facility];  Service: Orthopedics;  Laterality: Right;  Right total knee arthroplasty  . MANDIBLE SURGERY Bilateral    tmj  . REPLACEMENT TOTAL KNEE Bilateral   . ring around testicle hernia reapir  184 and 1986   x 2  . ROTATOR CUFF REPAIR Right   . SHOULDER SURGERY Left   . SPINE SURGERY  2010   "rod and screws", "failed", lopwer spine,   . stent to heart x 1  2010  . TONSILLECTOMY    . UMBILICAL HERNIA REPAIR     x 1  . varicose vein     stripping  . VIDEO BRONCHOSCOPY Bilateral 10/16/2015   Procedure: VIDEO BRONCHOSCOPY WITHOUT FLUORO;  Surgeon: Kalman Shan, MD;  Location: [facility];  Service: Cardiopulmonary;  Laterality: Bilateral;    MEDICATIONS: Current Outpatient Medications on File Prior to Visit  Medication Sig Dispense Refill  . acetaminophen-codeine (TYLENOL #3) 300-30 MG tablet Take 1 tablet by mouth every 8 (eight) hours as needed for moderate pain. 30 tablet 0  . atorvastatin (LIPITOR) 10 MG tablet Take 1 tablet (10 mg total) by mouth at bedtime. 30 tablet 11  . ciclopirox (LOPROX) 0.77 % cream Apply topically 2 (two)  times daily. (Patient not taking: Reported on 07/26/2018) 90 g 1  . clindamycin (CLEOCIN) 150 MG capsule Take 600 mg by mouth See admin instructions. Take 4 capsules (600 mg) by mouth 1 hour prior to dental appointments.  2  . [facility] B-1 100 MG tablet TAKE 1 TABLET BY MOUTH EVERY DAY 90 tablet 0  . darunavir (PREZISTA) 800 MG tablet TAKE 1 TABLET (800 MG TOTAL) BY MOUTH DAILY. (Patient taking differently: Take 800 mg by mouth daily. ) 90 tablet 1  . Dermatological Products, Misc. [facility]) lotion Apply 1 Act topically 2 (two) times daily with a meal. (Patient not taking: Reported on 07/26/2018) 100 g 0  . elvitegravir-cobicistat-emtricitabine-tenofovir (GENVOYA) 150-150-200-10 MG TABS tablet Take 1 tablet by mouth daily with breakfast. 90 tablet 1  . escitalopram (LEXAPRO) 10 MG tablet TAKE 1 TABLET (10 MG TOTAL) BY MOUTH DAILY. (Patient not taking: Reported on 07/26/2018) 90 tablet 1  . escitalopram (LEXAPRO) 20 MG tablet Take 20 mg by mouth daily.    . famotidine (PEPCID) 40 MG tablet Take 40 mg by mouth daily.     . folic acid (FOLVITE) 1 MG tablet Take 1 mg by mouth daily.  11  . furosemide (LASIX) 20 MG tablet Take 1 tablet (20 mg total) by mouth 2 (two) times daily. 90 tablet 3  . leflunomide (ARAVA) 20 MG tablet Take 20 mg by mouth daily.     . metoprolol succinate (TOPROL-XL) 25 MG 24 hr tablet TAKE 1 TABLET BY MOUTH EVERY DAY (Patient taking differently: Take 25 mg by mouth daily. ) 90 tablet 3  . nitroGLYCERIN (NITROSTAT) 0.4 MG SL tablet Place 1 tablet (0.4 mg total) under the tongue every 5 (five) minutes as needed for chest pain. 25 tablet 2  . predniSONE (DELTASONE) 5 MG tablet Take 5 mg by mouth 2 (two) times daily.   1  . pregabalin (LYRICA) 200 MG capsule Take 1 capsule (200 mg total) by mouth 2 (two) times daily. 180 capsule 1  . REXULTI 0.25 MG TABS Take 0.25 mg by mouth daily.   2  . SAVAYSA 30 MG TABS tablet TAKE 1 TABLET (30 MG TOTAL) BY MOUTH DAILY. 90 tablet 1  .  sulfamethoxazole-trimethoprim (BACTRIM DS,SEPTRA DS) 800-160 MG tablet TAKE 1  TABLET BY MOUTH EVERY 12 (TWELVE) HOURS. 60 tablet 3  . valACYclovir (VALTREX) 500 MG tablet Take 1 tablet (500 mg total) by mouth daily. 90 tablet 3   No current facility-administered medications on file prior to visit.     ALLERGIES: Allergies  Allergen Reactions  . Golimumab Anaphylaxis    simponi aria  . Orencia [Abatacept] Anaphylaxis  . Other Anaphylaxis and Hives    Pecan  . Peanut-Containing Drug Products Anaphylaxis, Hives and Swelling    Swelling of throat  . Morphine Other (See Comments)    REACTION: severe headache  . Oxycodone-Acetaminophen Other (See Comments)    REACTION: headache  . Penicillins Rash and Other (See Comments)    FLUSHED, RED  Has patient had a PCN reaction causing immediate rash, facial/tongue/throat swelling, SOB or lightheadedness with hypotension: #  #  #  YES  #  #  #  Has patient had a PCN reaction causing severe rash involving mucus membranes or skin necrosis: No Has patient had a PCN reaction that required hospitalization No Has patient had a PCN reaction occurring within the last 10 years: No If all of the above answers are "NO", then may proceed with Cephalosporin use  . Promethazine Hcl Other (See Comments)    REACTION: makes him feel drunk at higher strengths    FAMILY HISTORY: Family History  Problem Relation Age of Onset  . Breast cancer Mother   . Hypertension Mother   . Hyperlipidemia Mother   . Diabetes Mother   . Prostate cancer Father   . Colon polyps Father   . Hyperlipidemia Father   . Crohn's disease Paternal Aunt   . Diabetes Maternal Grandmother   . Diabetes Brother        x 3  . Heart disease Brother        x 3  . Hyperlipidemia Brother        x 3  . Alcohol abuse Daughter   . Drug abuse Daughter   . Asthma Brother   . Colon cancer Neg Hx   . Esophageal cancer Neg Hx   . Rectal cancer Neg Hx   . Stomach cancer Neg Hx    SOCIAL  HISTORY: Social History   Socioeconomic History  . Marital status: Widowed    Spouse name: Not on file  . Number of children: 3  . Years of education: Not on file  . Highest education level: Not on file  Occupational History  . Occupation: retired  Engineer, production  . Financial resource strain: Not on file  . Food insecurity:    Worry: Not on file    Inability: Not on file  . Transportation needs:    Medical: Not on file    Non-medical: Not on file  Tobacco Use  . Smoking status: Never Smoker  . Smokeless tobacco: Never Used  . Tobacco comment: occ wine  Substance and Sexual Activity  . Alcohol use: Yes    Comment: occasional wine- 1-2 per week  . Drug use: No  . Sexual activity: Not Currently    Comment: declined condoms  Lifestyle  . Physical activity:    Days per week: Not on file    Minutes per session: Not on file  . Stress: Not on file  Relationships  . Social connections:    Talks on phone: Not on file    Gets together: Not on file    Attends religious service: Not on file    Active member of  club or organization: Not on file    Attends meetings of clubs or organizations: Not on file    Relationship status: Not on file  . Intimate partner violence:    Fear of current or ex partner: Not on file    Emotionally abused: Not on file    Physically abused: Not on file    Forced sexual activity: Not on file  Other Topics Concern  . Not on file  Social History Narrative  . Not on file    REVIEW OF SYSTEMS: Constitutional: No fevers, chills, or sweats, no generalized fatigue, change in appetite Eyes: No visual changes, double vision, eye pain Ear, nose and throat: No hearing loss, ear pain, nasal congestion, sore throat Cardiovascular: No chest pain, palpitations Respiratory:  No shortness of breath at rest or with exertion, wheezes GastrointestinaI: No nausea, vomiting, diarrhea, abdominal pain, fecal incontinence Genitourinary:  No dysuria, urinary retention or  frequency Musculoskeletal:  Chronic joint pain Integumentary: No rash, pruritus, skin lesions Neurological: as above Psychiatric: depression, anxiety Endocrine: No palpitations, fatigue, diaphoresis, mood swings, change in appetite, change in weight, increased thirst Hematologic/Lymphatic:  No purpura, petechiae. Allergic/Immunologic: no itchy/runny eyes, nasal congestion, recent allergic reactions, rashes  PHYSICAL EXAM: Blood pressure 100/68, pulse 62, temperature 98.1 F (36.7 C), height 5\' 9"  (1.753 m), weight 203 lb (92.1 kg), SpO2 92 %. General: No acute distress.  Patient appears well-groomed.   Head:  Normocephalic/atraumatic Eyes:  fundi examined but not visualized Neck: supple, no paraspinal tenderness, full range of motion Back: No paraspinal tenderness Heart: regular rate and rhythm Lungs: Clear to auscultation bilaterally. Vascular: No carotid bruits. Neurological Exam: Mental status: alert and oriented to person, place, and time, recent and remote memory intact, fund of knowledge intact, attention and concentration intact, speech fluent and not dysarthric, language intact.  Visuospatial and executive functioning intact (able to complete Trail Making Test, copy cube and draw clock) MMSE - Mini Mental State Exam 07/29/2018  Orientation to time 5  Orientation to Place 5  Registration 3  Attention/ Calculation 5  Recall 2  Language- name 2 objects 2  Language- repeat 1  Language- follow 3 step command 3  Language- read & follow direction 1  Write a sentence 1  Copy design 1  Total score 29   Cranial nerves: CN I: not tested CN II: pupils equal, round and reactive to light, visual fields intact CN III, IV, VI:  full range of motion, no nystagmus, no ptosis CN V: facial sensation intact CN VII: upper and lower face symmetric CN VIII: hearing intact CN IX, X: gag intact, uvula midline CN XI: sternocleidomastoid and trapezius muscles intact CN XII: tongue  midline Bulk & Tone: atrophy in both hands; no fasciculations. Motor:  Decreased effort in left deltoid due to pain.  4+/5 bilateral hip flexion.  Otherwise 5/5. Sensation:  Pinprick sensation reduced in hands up to below elbows, reduced in feet up to above knees.  Reduced vibration sensation in toes. Deep Tendon Reflexes:  2+ upper extremities, absent in lower extremities, toes equivocal. Finger to nose testing:  Without dysmetria.  Heel to shin:  Without dysmetria.   Gait:  Antalgic gait.  Flexed posture.  Cautious.  Unable to tandem walk.  Romberg positive.  IMPRESSION: 1.  Unsteady gait/balance problems with weakness.  Secondary to known chronic polyneuropathy and arthritis and pain.  I think worse over the past year due to deconditioning as he has been sedentary and not participating in routine exercise.  Recent labs were negative for myopathy or myasthenia. 2.  Mental fog.  Prior testing has demonstrated no cognitive impairment.  No cognitive impairment appreciated on exam.    Recent MRI of brain unremarkable  Recommend another round of physical therapy when deemed appropriate.   Follow up as needed.  Thank you for allowing me to take part in the care of this patient.  Shon Millet, DO  CC:  Sanda Linger, MD  Alben Deeds, MD

## 2018-07-28 ENCOUNTER — Telehealth (INDEPENDENT_AMBULATORY_CARE_PROVIDER_SITE_OTHER): Payer: Self-pay | Admitting: Orthopedic Surgery

## 2018-07-28 NOTE — Telephone Encounter (Signed)
I called s/w patient ok to keep this appointment.

## 2018-07-28 NOTE — Telephone Encounter (Signed)
Patient would like to know if he is to keep his 08-02-18 appointment for measurements. He said he would like to go ahead and get this out of the way.   Please advise

## 2018-07-28 NOTE — Telephone Encounter (Signed)
See note below. IC patient.

## 2018-07-28 NOTE — Telephone Encounter (Signed)
IC s/w patient and advised that due to what is currently going on with COVID-19 that we are at a stand still as far as elective surgical cases.  Advised we are unsure at this time if elective surgeries will be allowed to be done at the hospital but we would keep him informed. We are waiting for definite answer but would be in touch with him as soon as we knew.

## 2018-07-29 ENCOUNTER — Other Ambulatory Visit: Payer: Self-pay

## 2018-07-29 ENCOUNTER — Encounter: Payer: Self-pay | Admitting: Neurology

## 2018-07-29 ENCOUNTER — Encounter

## 2018-07-29 ENCOUNTER — Ambulatory Visit (INDEPENDENT_AMBULATORY_CARE_PROVIDER_SITE_OTHER): Payer: Medicare Other | Admitting: Neurology

## 2018-07-29 VITALS — BP 100/68 | HR 62 | Temp 98.1°F | Ht 69.0 in | Wt 203.0 lb

## 2018-07-29 DIAGNOSIS — M069 Rheumatoid arthritis, unspecified: Secondary | ICD-10-CM | POA: Diagnosis not present

## 2018-07-29 DIAGNOSIS — G609 Hereditary and idiopathic neuropathy, unspecified: Secondary | ICD-10-CM

## 2018-07-29 NOTE — Patient Instructions (Signed)
I think the balance problems are related to the neuropathy and arthritis, worse due to deconditioning.  When appropriate, consider another round of physical therapy I don't appreciate any cognitive impairment.   Follow up as needed.

## 2018-07-30 ENCOUNTER — Telehealth (INDEPENDENT_AMBULATORY_CARE_PROVIDER_SITE_OTHER): Payer: Self-pay | Admitting: *Deleted

## 2018-07-30 NOTE — Telephone Encounter (Signed)
Asked COVID-19 pre screening questions to patient and they answered no to all.  Pt does live in [place], but outside of residential address pt has not traveled.

## 2018-08-02 ENCOUNTER — Ambulatory Visit (INDEPENDENT_AMBULATORY_CARE_PROVIDER_SITE_OTHER): Payer: Medicare Other | Admitting: Orthopedic Surgery

## 2018-08-02 ENCOUNTER — Encounter (INDEPENDENT_AMBULATORY_CARE_PROVIDER_SITE_OTHER): Payer: Self-pay | Admitting: Orthopedic Surgery

## 2018-08-02 ENCOUNTER — Other Ambulatory Visit (HOSPITAL_COMMUNITY): Payer: Self-pay

## 2018-08-02 ENCOUNTER — Other Ambulatory Visit: Payer: Self-pay

## 2018-08-02 DIAGNOSIS — M1612 Unilateral primary osteoarthritis, left hip: Secondary | ICD-10-CM

## 2018-08-02 DIAGNOSIS — M19012 Primary osteoarthritis, left shoulder: Secondary | ICD-10-CM

## 2018-08-02 NOTE — Progress Notes (Signed)
Office Visit Note   Patient: [name]           Date of Birth: [birth year]           MRN: [id number] Visit Date: 08/02/2018 Requested by: [name], MD [address], [place] [address] PCP: [name], MD  Subjective: Chief Complaint  Patient presents with  . Follow-up    for measurements    HPI: Patient presents for evaluation of left hip and left shoulder.  In general his left shoulder is doing little bit better.  His left hip however is giving him significant problem.  He does use a cane.  He has kyphosis from prior surgery involving thoracolumbar fusion.  MRI scan does show AVN and stress reaction and bone-on-bone changes.              ROS: All systems reviewed are negative as they relate to the chief complaint within the history of present illness.  Patient denies  fevers or chills.   Assessment & Plan: Visit Diagnoses:  1. Unilateral primary osteoarthritis, left hip     Plan: Impression is left shoulder rotator cuff arthropathy but with functional shoulder at this time with forward flexion and abduction both at or slightly above 90 degrees.  He is more consuming clinical complaint is left hip pain.  Left hip replacement is indicated at this time even though this will be difficult due to patient positioning from his kyphosis as well as correct implant positioning due to altered gait mechanics.  Discussed this with Dr. Ninfa Linden who agrees to see the patient.  I will see him back as needed  Follow-Up Instructions: No follow-ups on file.   Orders:  No orders of the defined types were placed in this encounter.  No orders of the defined types were placed in this encounter.     Procedures: No procedures performed   Clinical Data: No additional findings.  Objective: Vital Signs: There were no vitals taken for this visit.  Physical Exam:   Constitutional: Patient appears well-developed HEENT:  Head: Normocephalic Eyes:EOM are  normal Neck: Normal range of motion Cardiovascular: Normal rate Pulmonary/chest: Effort normal Neurologic: Patient is alert Skin: Skin is warm Psychiatric: Patient has normal mood and affect    Ortho Exam: Ortho exam demonstrates that his head forehead to chair distance is about 50 cm.  I do not think that is necessarily compatible with Antony Haste bed head positioning but I will have to check.  He does have significant left hip pain with internal/external rotation.  Did get good anesthetic relief from left hip injection done in February.  Specialty Comments:  No specialty comments available.  Imaging: No results found.   PMFS History: Patient Active Problem List   Diagnosis Date Noted  . Eczema 07/01/2018  . Candida rash of groin 07/01/2018  . Tinea corporis 01/22/2018  . Depression with anxiety 01/14/2018  . 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 Carotid artery disease ([place]) 11/13/2016  . Stenosis of carotid artery 11/13/2016  . Spinal stenosis of thoracolumbar region 07/02/2016  . ILD (interstitial lung disease) ([place]) 09/13/2015  . Immunocompromised state ([place]) 09/13/2015  . PCP (pneumocystis jiroveci pneumonia) ([place]) 06/18/2015  . Postinflammatory pulmonary fibrosis ([place]) 05/11/2015  . GERD (gastroesophageal reflux disease)   . Hereditary and idiopathic peripheral neuropathy 09/08/2013  . Erosive esophagitis 12/28/2012  . Allergic rhinitis, cause unspecified 04/28/2012  . Long term current use of anticoagulant therapy 02/03/2012  . Hypertension   .  DVT, lower extremity, recurrent ([place])   . Gout   . Carotid artery occlusion   . Hyperlipidemia with target LDL less than 100   . HIV infection ([place]) 04/08/2011  . Arthritis, rheumatoid ([place]) 04/08/2011  . Chronic lymphoblastic leukemia 04/08/2011  . Impotence of organic origin 04/02/2011  . CAD (coronary artery disease) of artery bypass graft 02/14/2009  . Herpes genitalis 06/22/2008   Past Medical History:  Diagnosis  Date  . Allergy   . Anxiety   . Carotid artery occlusion    40-60% right ICA stenosis (09/2008)  . Cataract   . Chronic back pain   . CLL (chronic lymphoblastic leukemia) dx 2010   Followed [facility] q[facility], no current therapy   . Clotting disorder ([place])   . Coronary artery disease 2010   s/p CABG '10, sees Dr. Percival Spanish  . Depression   . Diverticulosis   . DVT, lower extremity, recurrent ([place]) 2008, 2009   LLE, chronic anticoag since 2009  . Esophagitis   . Fibromyalgia   . Gallstones   . GERD (gastroesophageal reflux disease)   . Gout   . Gynecomastia, male   . H/O hiatal hernia 2008   surgery  . Hemorrhoids   . Hepatitis A yrs ago  . HIV infection ([place]) dx 1993  . Hypertension   . Impotence of organic origin   . Myocardial infarction ([place]) 2010    x 2  . Neuromuscular disorder (HCC)    neuropathy  . Osteoarthritis, knee    s/p B TKA  . Osteoporosis   . Pneumonia mrach, may, july 2017  . Rheumatoid arthritis(714.0) dx 2010   MTX, follows with rheum  . Seasonal allergies   . Secondary syphilis 07/24/14 dx   s/p 2 wks doxy  . Status post dilation of esophageal narrowing   . Stroke ([place]) [place]   . TIA (transient ischemic attack) 1997   mild residual L mouth droop  . Tubular adenoma of colon     Family History  Problem Relation Age of Onset  . Breast cancer Mother   . Hypertension Mother   . Hyperlipidemia Mother   . Diabetes Mother   . Prostate cancer Father   . Colon polyps Father   . Hyperlipidemia Father   . Crohn's disease Paternal Aunt   . Diabetes Maternal Grandmother   . Diabetes Brother        x 3  . Heart attack Brother   . Heart disease Brother        x 3  . Heart attack Brother   . Hyperlipidemia Brother        x 3  . Alcohol abuse Daughter   . Drug abuse Daughter   . Asthma Brother   . CVA Brother   . Colon cancer Neg Hx   . Esophageal cancer Neg Hx   . Rectal cancer Neg Hx   . Stomach cancer Neg Hx     Past Surgical History:   Procedure Laterality Date  . CHOLECYSTECTOMY    . COLONOSCOPY WITH PROPOFOL N/A 12/28/2012   Procedure: COLONOSCOPY WITH PROPOFOL;  Surgeon: Jerene Bears, MD;  [facility];  Service: Gastroenterology;  Laterality: N/A;  . CORONARY ARTERY BYPASS GRAFT  2010   triple bypass  . ESOPHAGOGASTRODUODENOSCOPY (EGD) WITH PROPOFOL N/A 12/28/2012   Procedure: ESOPHAGOGASTRODUODENOSCOPY (EGD) WITH PROPOFOL;  Surgeon: Jerene Bears, MD;  [facility];  Service: Gastroenterology;  Laterality: N/A;  . ESOPHAGOGASTRODUODENOSCOPY (EGD) WITH  PROPOFOL N/A 03/15/2013   Procedure: ESOPHAGOGASTRODUODENOSCOPY (EGD) WITH PROPOFOL;  Surgeon: Jerene Bears, MD;  [facility];  Service: Gastroenterology;  Laterality: N/A;  . ESOPHAGOGASTRODUODENOSCOPY (EGD) WITH PROPOFOL N/A 02/07/2016   Procedure: ESOPHAGOGASTRODUODENOSCOPY (EGD) WITH PROPOFOL;  Surgeon: Jerene Bears, MD;  [facility];  Service: Gastroenterology;  Laterality: N/A;  . HARDWARE REMOVAL N/A 07/02/2012   Procedure: HARDWARE REMOVAL;  Surgeon: Elaina Hoops, MD;  Location: [facility];  Service: Neurosurgery;  Laterality: N/A;  . HIATAL HERNIA REPAIR     wrap  . INGUINAL HERNIA REPAIR Bilateral   . JOINT REPLACEMENT Left 1999  . KNEE ARTHROPLASTY  07/22/2011   Procedure: COMPUTER ASSISTED TOTAL KNEE ARTHROPLASTY;  Surgeon: Meredith Pel, MD;  Location: [facility];  Service: Orthopedics;  Laterality: Right;  Right total knee arthroplasty  . MANDIBLE SURGERY Bilateral    tmj  . REPLACEMENT TOTAL KNEE Bilateral   . ring around testicle hernia reapir  184 and 1986   x 2  . ROTATOR CUFF REPAIR Right   . SHOULDER SURGERY Left   . SPINE SURGERY  2010   "rod and screws", "failed", lopwer spine,   . stent to heart x 1  2010  . TONSILLECTOMY    . UMBILICAL HERNIA REPAIR     x 1  . varicose vein     stripping  . VIDEO BRONCHOSCOPY Bilateral 10/16/2015   Procedure: VIDEO BRONCHOSCOPY WITHOUT FLUORO;  Surgeon: Brand Males, MD;  [facility];  Service: Cardiopulmonary;  Laterality: Bilateral;   Social History   Occupational History  . Occupation: retired  Tobacco Use  . Smoking status: Never Smoker  . Smokeless tobacco: Never Used  . Tobacco comment: occ wine  Substance and Sexual Activity  . Alcohol use: Yes    Comment: occasional wine- 1-2 per week  . Drug use: No  . Sexual activity: Not Currently    Comment: declined condoms

## 2018-08-05 ENCOUNTER — Ambulatory Visit: Payer: Self-pay | Admitting: Internal Medicine

## 2018-08-05 ENCOUNTER — Other Ambulatory Visit: Payer: Self-pay | Admitting: Infectious Diseases

## 2018-08-05 ENCOUNTER — Other Ambulatory Visit: Payer: Self-pay | Admitting: Physician Assistant

## 2018-08-05 ENCOUNTER — Other Ambulatory Visit: Payer: Self-pay | Admitting: Internal Medicine

## 2018-08-05 DIAGNOSIS — E5111 Dry beriberi: Secondary | ICD-10-CM

## 2018-08-05 DIAGNOSIS — B59 Pneumocystosis: Secondary | ICD-10-CM

## 2018-08-05 DIAGNOSIS — B20 Human immunodeficiency virus [HIV] disease: Secondary | ICD-10-CM

## 2018-08-05 DIAGNOSIS — I82403 Acute embolism and thrombosis of unspecified deep veins of lower extremity, bilateral: Secondary | ICD-10-CM

## 2018-08-06 ENCOUNTER — Telehealth (INDEPENDENT_AMBULATORY_CARE_PROVIDER_SITE_OTHER): Payer: Self-pay

## 2018-08-06 ENCOUNTER — Other Ambulatory Visit: Payer: Self-pay | Admitting: Behavioral Health

## 2018-08-06 DIAGNOSIS — B20 Human immunodeficiency virus [HIV] disease: Secondary | ICD-10-CM

## 2018-08-06 MED ORDER — DARUNAVIR ETHANOLATE 800 MG PO TABS: ORAL_TABLET | ORAL | 0 refills | Status: DC

## 2018-08-06 MED ORDER — ELVITEG-COBIC-EMTRICIT-TENOFAF 150-150-200-10 MG PO TABS: 1.0000 | ORAL_TABLET | Freq: Every day | ORAL | 0 refills | Status: DC

## 2018-08-06 NOTE — Telephone Encounter (Signed)
Called and lm on vm to call the office back. Prescreen questions for COVID-19 pt has an appt on Monday with CB

## 2018-08-06 NOTE — Telephone Encounter (Signed)
Patient called back and answered "No" to all the prescreening questions

## 2018-08-09 ENCOUNTER — Ambulatory Visit (INDEPENDENT_AMBULATORY_CARE_PROVIDER_SITE_OTHER): Payer: Medicare Other

## 2018-08-09 ENCOUNTER — Other Ambulatory Visit: Payer: Self-pay

## 2018-08-09 ENCOUNTER — Encounter (INDEPENDENT_AMBULATORY_CARE_PROVIDER_SITE_OTHER): Payer: Self-pay | Admitting: Orthopaedic Surgery

## 2018-08-09 ENCOUNTER — Ambulatory Visit (INDEPENDENT_AMBULATORY_CARE_PROVIDER_SITE_OTHER): Payer: Medicare Other | Admitting: Orthopaedic Surgery

## 2018-08-09 DIAGNOSIS — M87052 Idiopathic aseptic necrosis of left femur: Secondary | ICD-10-CM

## 2018-08-09 DIAGNOSIS — M25552 Pain in left hip: Secondary | ICD-10-CM | POA: Diagnosis not present

## 2018-08-09 MED ORDER — HYDROCODONE-ACETAMINOPHEN 5-325 MG PO TABS: 1.0000 | ORAL_TABLET | Freq: Four times a day (QID) | ORAL | 0 refills | Status: DC | PRN

## 2018-08-09 NOTE — Progress Notes (Signed)
Office Visit Note   Patient: [name]           Date of Birth: January 03, 1951           MRN: [id number] Visit Date: 08/09/2018              Requested by: [name], MD [address], [place] [address] PCP: [name], MD   Assessment & Plan: Visit Diagnoses:  1. Pain in left hip   2. Avascular necrosis of bone of hip, left (HCC)     Plan: Based on the patient's radiographic findings combined with his physical exam findings and the severity of his pain, I have recommended a more urgent total hip arthroplasty on the left side.  He would like to proceed with this as well.  I did have Dr. Wynelle Link reviewed this case for considering the appropriateness of surgery based on the recent coronavirus pandemic.  He agrees that this case needs to be done more urgently.  I talked to the patient in length about this.  We had a long and thorough discussion about surgery as well as the risk and benefits involved.  We talked about his intraoperative and postoperative course.  The risk and benefits were assessed in detail.  He understands my concerns for surgery as well as my concerns for him being a patient that is considered potentially immunocompromise in light of his added notable viral load and CD4 count being normal.  After this assessment was made, decision was made to get him scheduled for surgery by the end of this week.  All question concerns were answered and addressed.  We will work on getting this scheduled and see him back in 2 weeks postoperatively.  Follow-Up Instructions: Return for 2 weeks post-op.   Orders:  Orders Placed This Encounter  Procedures  . XR HIP UNILAT W OR W/O PELVIS 1V LEFT   Meds ordered this encounter  Medications  . HYDROcodone-acetaminophen (NORCO/VICODIN) 5-325 MG tablet    Sig: Take 1-2 tablets by mouth every 6 (six) hours as needed for moderate pain.    Dispense:  40 tablet    Refill:  0      Procedures: No procedures  performed   Clinical Data: No additional findings.   Subjective: Chief Complaint  Patient presents with  . Left Hip - Pain  The patient is a very pleasant 68 year old gentleman who has bilateral hip avascular necrosis but the left hip became severely more painful to him.  An MRI shows flattening of the femoral head with edema in the femoral head neck and a large joint effusion showing a more acute process with collapse.  The right hip has a nidus of avascular necrosis but he is asymptomatic on that side.  He is someone who walks with significant kyphosis of his spine.  He has a spinal fusion as well.  He is a patient who has a history of being HIV positive for the last 20 years but has an undetectable viral load and a low CD4 count.  His pain is become debilitating and is 10 out of 10.  Is definitely affecting his activities of daily living, his quality of life and his mobility is quite concerning for more of a need of an acute operative intervention.  He is already been seen by 1 of my partners who wants me to see him today.  The patient is having a hard time getting around with a cane and  has become a fall risk.  HPI  Review of Systems He currently denies any headache, chest pain, shortness of breath, fever, chills, nausea, vomiting  Objective: Vital Signs: There were no vitals taken for this visit.  Physical Exam He is alert and orient x3 and in no acute distress but obvious discomfort Ortho Exam On exam, I can easily move around his right hip but his left hip has severe debilitating pain with any attempts of internal and external rotation. Specialty Comments:  No specialty comments available.  Imaging: Xr Hip Unilat W Or W/o Pelvis 1v Left  Result Date: 08/09/2018 An AP pelvis and a lateral of the left hip shows avascular necrosis with flattening of the femoral head and evidence of collapse.  The MRI is reviewed of his left hip and it does show extensive edema in the femoral head  and femoral neck as well as a large joint effusion and flattening and collapse of the femoral head.  PMFS History: Patient Active Problem List   Diagnosis Date Noted  . Avascular necrosis of bone of hip, left ([place]) 08/09/2018  . Eczema 07/01/2018  . Candida rash of groin 07/01/2018  . Tinea corporis 01/22/2018  . Depression with anxiety 01/14/2018  . 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 Carotid artery disease ([place]) 11/13/2016  . Stenosis of carotid artery 11/13/2016  . Spinal stenosis of thoracolumbar region 07/02/2016  . ILD (interstitial lung disease) ([place]) 09/13/2015  . Immunocompromised state ([place]) 09/13/2015  . PCP (pneumocystis jiroveci pneumonia) ([place]) 06/18/2015  . Postinflammatory pulmonary fibrosis ([place]) 05/11/2015  . GERD (gastroesophageal reflux disease)   . Hereditary and idiopathic peripheral neuropathy 09/08/2013  . Erosive esophagitis 12/28/2012  . Allergic rhinitis, cause unspecified 04/28/2012  . Long term current use of anticoagulant therapy 02/03/2012  . Hypertension   . DVT, lower extremity, recurrent ([place])   . Gout   . Carotid artery occlusion   . Hyperlipidemia with target LDL less than 100   . HIV infection ([place]) 04/08/2011  . Arthritis, rheumatoid ([place]) 04/08/2011  . Chronic lymphoblastic leukemia 04/08/2011  . Impotence of organic origin 04/02/2011  . CAD (coronary artery disease) of artery bypass graft 02/14/2009  . Herpes genitalis 06/22/2008   Past Medical History:  Diagnosis Date  . Allergy   . Anxiety   . Carotid artery occlusion    40-60% right ICA stenosis (09/2008)  . Cataract   . Chronic back pain   . CLL (chronic lymphoblastic leukemia) dx 2010   Followed at [facility] q80mo, no current therapy   . Clotting disorder ([place])   . Coronary artery disease 2010   s/p CABG '10, sees Dr. Percival Spanish  . Depression   . Diverticulosis   . DVT, lower extremity, recurrent ([place]) 2008, 2009   LLE, chronic anticoag since 2009  . Esophagitis   .  Fibromyalgia   . Gallstones   . GERD (gastroesophageal reflux disease)   . Gout   . Gynecomastia, male   . H/O hiatal hernia 2008   surgery  . Hemorrhoids   . Hepatitis A yrs ago  . HIV infection ([place]) dx 1993  . Hypertension   . Impotence of organic origin   . Myocardial infarction ([place]) 2010    x 2  . Neuromuscular disorder (HCC)    neuropathy  . Osteoarthritis, knee    s/p B TKA  . Osteoporosis   . Pneumonia mrach, may, july 2017  . Rheumatoid arthritis(714.0) dx 2010   MTX, follows with rheum  . Seasonal  allergies   . Secondary syphilis 07/24/14 dx   s/p 2 wks doxy  . Status post dilation of esophageal narrowing   . Stroke ([place]) [place]   . TIA (transient ischemic attack) 1997   mild residual L mouth droop  . Tubular adenoma of colon     Family History  Problem Relation Age of Onset  . Breast cancer Mother   . Hypertension Mother   . Hyperlipidemia Mother   . Diabetes Mother   . Prostate cancer Father   . Colon polyps Father   . Hyperlipidemia Father   . Crohn's disease Paternal Aunt   . Diabetes Maternal Grandmother   . Diabetes Brother        x 3  . Heart attack Brother   . Heart disease Brother        x 3  . Heart attack Brother   . Hyperlipidemia Brother        x 3  . Alcohol abuse Daughter   . Drug abuse Daughter   . Asthma Brother   . CVA Brother   . Colon cancer Neg Hx   . Esophageal cancer Neg Hx   . Rectal cancer Neg Hx   . Stomach cancer Neg Hx     Past Surgical History:  Procedure Laterality Date  . CHOLECYSTECTOMY    . COLONOSCOPY WITH PROPOFOL N/A 12/28/2012   Procedure: COLONOSCOPY WITH PROPOFOL;  Surgeon: Jerene Bears, MD;  [facility];  Service: Gastroenterology;  Laterality: N/A;  . CORONARY ARTERY BYPASS GRAFT  2010   triple bypass  . ESOPHAGOGASTRODUODENOSCOPY (EGD) WITH PROPOFOL N/A 12/28/2012   Procedure: ESOPHAGOGASTRODUODENOSCOPY (EGD) WITH PROPOFOL;  Surgeon: Jerene Bears, MD;  [facility];  Service:  Gastroenterology;  Laterality: N/A;  . ESOPHAGOGASTRODUODENOSCOPY (EGD) WITH PROPOFOL N/A 03/15/2013   Procedure: ESOPHAGOGASTRODUODENOSCOPY (EGD) WITH PROPOFOL;  Surgeon: Jerene Bears, MD;  [facility];  Service: Gastroenterology;  Laterality: N/A;  . ESOPHAGOGASTRODUODENOSCOPY (EGD) WITH PROPOFOL N/A 02/07/2016   Procedure: ESOPHAGOGASTRODUODENOSCOPY (EGD) WITH PROPOFOL;  Surgeon: Jerene Bears, MD;  [facility];  Service: Gastroenterology;  Laterality: N/A;  . HARDWARE REMOVAL N/A 07/02/2012   Procedure: HARDWARE REMOVAL;  Surgeon: Elaina Hoops, MD;  Location: [facility];  Service: Neurosurgery;  Laterality: N/A;  . HIATAL HERNIA REPAIR     wrap  . INGUINAL HERNIA REPAIR Bilateral   . JOINT REPLACEMENT Left 1999  . KNEE ARTHROPLASTY  07/22/2011   Procedure: COMPUTER ASSISTED TOTAL KNEE ARTHROPLASTY;  Surgeon: Meredith Pel, MD;  Location: [facility];  Service: Orthopedics;  Laterality: Right;  Right total knee arthroplasty  . MANDIBLE SURGERY Bilateral    tmj  . REPLACEMENT TOTAL KNEE Bilateral   . ring around testicle hernia reapir  184 and 1986   x 2  . ROTATOR CUFF REPAIR Right   . SHOULDER SURGERY Left   . SPINE SURGERY  2010   "rod and screws", "failed", lopwer spine,   . stent to heart x 1  2010  . TONSILLECTOMY    . UMBILICAL HERNIA REPAIR     x 1  . varicose vein     stripping  . VIDEO BRONCHOSCOPY Bilateral 10/16/2015   Procedure: VIDEO BRONCHOSCOPY WITHOUT FLUORO;  Surgeon: Brand Males, MD;  [facility];  Service: Cardiopulmonary;  Laterality: Bilateral;   Social History   Occupational History  . Occupation: retired  Tobacco Use  . Smoking status: Never Smoker  . Smokeless tobacco: Never  Used  . Tobacco comment: occ wine  Substance and Sexual Activity  . Alcohol use: Yes    Comment: occasional wine- 1-2 per week  . Drug use: No  . Sexual activity: Not Currently    Comment: declined condoms

## 2018-08-10 ENCOUNTER — Telehealth: Payer: Self-pay

## 2018-08-10 ENCOUNTER — Encounter (HOSPITAL_COMMUNITY): Admission: RE | Payer: Self-pay | Source: Home / Self Care

## 2018-08-10 ENCOUNTER — Inpatient Hospital Stay (HOSPITAL_COMMUNITY): Admission: RE | Admit: 2018-08-10 | Payer: Medicare Other | Source: Home / Self Care | Admitting: Orthopedic Surgery

## 2018-08-10 ENCOUNTER — Telehealth (INDEPENDENT_AMBULATORY_CARE_PROVIDER_SITE_OTHER): Payer: Self-pay | Admitting: *Deleted

## 2018-08-10 ENCOUNTER — Other Ambulatory Visit (INDEPENDENT_AMBULATORY_CARE_PROVIDER_SITE_OTHER): Payer: Self-pay | Admitting: Physician Assistant

## 2018-08-10 SURGERY — ARTHROPLASTY, SHOULDER, TOTAL
Anesthesia: General | Laterality: Left

## 2018-08-10 NOTE — Pre-Procedure Instructions (Signed)
[name]  08/10/2018      [id number] [place] [place] [address] [place] [address] Phone: [phone] Fax: [phone]  [id number] [place], [place] - [address] AT [facility] [address] [place] [address] Phone: [phone] Fax: [phone]    Your procedure is scheduled on 08-13-18 Friday from [id number]  Report to [facility], Entrance A  at [place].M.  Call this number if you have problems the morning of surgery:  [phone]   Remember:  Do not eat  after midnight. You are allowed to drink clear liquids till 0430 am day of surgery.                       Clear liquids include juice (no-pulp), carbonated beverages, clear tea, black coffee only,pain             Jell-o, plain posicles, gatorade.  Please complete your PRE-SURGERY ENSURE that was provided to you  by 0430 am the day of surgery.  Please, if able, drink it in one setting. DO NOT SIP.     Take these medicines the morning of surgery with A SIP OF WATER :              Darunavir (PREZISTA)              Elvitegravir-cobicistat-emtricitabine-tenofovir( GENVOYA)              Escitalopram (LEXAPRO)              Famotidine (PEPCID)              Leflunomide (ARAVA)              Prednisone (DELTASONE)              Pregabalin (LYRICA)              Rexulti              Valacyclovir (VALTREX)             As needed :            Acetaminophen-codeine( TYLENOL #3) or            Hydrocodone-acetaminophen (NORCO/VICODIN) for moderate pain            Nitroglycerin (NITROSTAT)             Follow your surgeon's instructions on when to stop SAVAYSA.  If no instructions were given by your surgeon then you will need to call the office to get those instructions.    As of today STOP taking any Aspirin (unless otherwise instructed by your surgeon), Aleve, Naproxen, Ibuprofen, Motrin,  Advil, Goody's, BC's, all herbal medications, fish oil, and all vitamins.    Do not wear jewelry.  Do not wear lotions, powders, or colognes, or deodorant.  Do not shave 48 hours prior to surgery.  Men may shave face and neck.  Do not bring valuables to the hospital.  [facility] is not responsible for any belongings or valuables.  Contacts, dentures or bridgework may not be worn into surgery.  Leave your suitcase in the car.  After surgery it may be brought to your room.  For patients admitted to the hospital, discharge time will be determined by your treatment team.  Patients discharged the day of surgery  will not be allowed to drive home.   Special instructions: [place]- Preparing For Surgery  Before surgery, you can play an important role. Because skin is not sterile, your skin needs to be as free of germs as possible. You can reduce the number of germs on your skin by washing with CHG (chlorahexidine gluconate) Soap before surgery.  CHG is an antiseptic cleaner which kills germs and bonds with the skin to continue killing germs even after washing.    Oral Hygiene is also important to reduce your risk of infection.  Remember - BRUSH YOUR TEETH THE MORNING OF SURGERY WITH YOUR REGULAR TOOTHPASTE  Please do not use if you have an allergy to CHG or antibacterial soaps. If your skin becomes reddened/irritated stop using the CHG.  Do not shave (including legs and underarms) for at least 48 hours prior to first CHG shower. It is OK to shave your face.  Please follow these instructions carefully.   1. Shower the NIGHT BEFORE SURGERY and the MORNING OF SURGERY with CHG.   2. If you chose to wash your hair, wash your hair first as usual with your normal shampoo.  3. After you shampoo, rinse your hair and body thoroughly to remove the shampoo.  4. Use CHG as you would any other liquid soap. You can apply CHG directly to the skin and wash gently with a scrungie or a clean washcloth.    5. Apply the CHG Soap to your body ONLY FROM THE NECK DOWN.  Do not use on open wounds or open sores. Avoid contact with your eyes, ears, mouth and genitals (private parts). Wash Face and genitals (private parts)  with your normal soap.  6. Wash thoroughly, paying special attention to the area where your surgery will be performed.  7. Thoroughly rinse your body with warm water from the neck down.  8. DO NOT shower/wash with your normal soap after using and rinsing off the CHG Soap.  9. Pat yourself dry with a CLEAN TOWEL.  10. Wear CLEAN PAJAMAS to bed the night before surgery, wear comfortable clothes the morning of surgery  11. Place CLEAN SHEETS on your bed the night of your first shower and DO NOT SLEEP WITH PETS.  Day of Surgery:  Do not apply any deodorants/lotions.  Please wear clean clothes to the hospital/surgery center.   Remember to brush your teeth WITH YOUR REGULAR TOOTHPASTE.  Please read over the following fact sheets that you were given. Pain Booklet, Coughing and Deep Breathing, MRSA Information and Surgical Site Infection Prevention

## 2018-08-10 NOTE — Telephone Encounter (Signed)
   [facility] Pre-operative Risk Assessment    Request for surgical clearance:  1. What type of surgery is being performed? Left total hip arthrophasty    2. When is this surgery scheduled? 08/13/18  3. What type of clearance is required (medical clearance vs. Pharmacy clearance to hold med vs. Both)? Medical clearance  4. Are there any medications that need to be held prior to surgery and how long? None  5. Practice name and name of physician performing surgery? [facility], Dr. Zollie Beckers  6. What is your office phone number [phone]    7.   What is your office fax number [phone]  8.   Anesthesia type (None, local, MAC, general) ? Unknown   [name] 08/10/2018, 5:24 PM  ___________________________________________________________

## 2018-08-10 NOTE — Telephone Encounter (Signed)
Ortho Bundle Pre-op call and all questionnaires completed.

## 2018-08-10 NOTE — Care Plan (Signed)
RNCM called to patient to discuss Ortho bundle. Reviewed [facility] Bundle Pre-qualification Questionnaire as well as Promis-10 and Hoos survey. Patient agrees to participate in Ortho bundle. Reviewed surgery currently scheduled for Left Total hip arthroplasty on 08/13/18 with Dr. Ninfa Linden. Patient has family that can assist at home after surgery and has a FWW. Patient has stopped his Savaysa today as instructed. RNCM will check with [name], patient's Rheumatologist, to determine whether he should stop his [place] used for his RA prior to surgery. RNCM left a VM for office nurse at [place] and made aware of upcoming surgery. Call back and message received from [facility] with [facility] noting that [name] gave instructions that patient does not have to stop [place] prior to surgery. RNCM made patient aware. Will assist with setting up appropriate discharge rehab needs for after surgery. Patient aware of how to contact RNCM with any questions or needs.

## 2018-08-11 ENCOUNTER — Other Ambulatory Visit: Payer: Self-pay

## 2018-08-11 ENCOUNTER — Encounter (HOSPITAL_COMMUNITY): Payer: Self-pay

## 2018-08-11 ENCOUNTER — Encounter (HOSPITAL_COMMUNITY)
Admission: RE | Admit: 2018-08-11 | Discharge: 2018-08-11 | Disposition: A | Discharge status: Home / Self Care | Payer: Medicare Other | Source: Ambulatory Visit | Attending: Orthopaedic Surgery | Admitting: Orthopaedic Surgery

## 2018-08-11 DIAGNOSIS — M109 Gout, unspecified: Secondary | ICD-10-CM | POA: Insufficient documentation

## 2018-08-11 DIAGNOSIS — Z79899 Other long term (current) drug therapy: Secondary | ICD-10-CM

## 2018-08-11 DIAGNOSIS — M797 Fibromyalgia: Secondary | ICD-10-CM | POA: Insufficient documentation

## 2018-08-11 DIAGNOSIS — Z7952 Long term (current) use of systemic steroids: Secondary | ICD-10-CM

## 2018-08-11 DIAGNOSIS — I10 Essential (primary) hypertension: Secondary | ICD-10-CM

## 2018-08-11 DIAGNOSIS — M069 Rheumatoid arthritis, unspecified: Secondary | ICD-10-CM

## 2018-08-11 DIAGNOSIS — Z7901 Long term (current) use of anticoagulants: Secondary | ICD-10-CM

## 2018-08-11 DIAGNOSIS — Z951 Presence of aortocoronary bypass graft: Secondary | ICD-10-CM | POA: Insufficient documentation

## 2018-08-11 DIAGNOSIS — F419 Anxiety disorder, unspecified: Secondary | ICD-10-CM | POA: Insufficient documentation

## 2018-08-11 DIAGNOSIS — Z96653 Presence of artificial knee joint, bilateral: Secondary | ICD-10-CM

## 2018-08-11 DIAGNOSIS — Z8673 Personal history of transient ischemic attack (TIA), and cerebral infarction without residual deficits: Secondary | ICD-10-CM | POA: Insufficient documentation

## 2018-08-11 DIAGNOSIS — B20 Human immunodeficiency virus [HIV] disease: Secondary | ICD-10-CM | POA: Insufficient documentation

## 2018-08-11 DIAGNOSIS — I25810 Atherosclerosis of coronary artery bypass graft(s) without angina pectoris: Secondary | ICD-10-CM | POA: Insufficient documentation

## 2018-08-11 DIAGNOSIS — Z01812 Encounter for preprocedural laboratory examination: Secondary | ICD-10-CM | POA: Insufficient documentation

## 2018-08-11 DIAGNOSIS — F329 Major depressive disorder, single episode, unspecified: Secondary | ICD-10-CM | POA: Insufficient documentation

## 2018-08-11 DIAGNOSIS — I6523 Occlusion and stenosis of bilateral carotid arteries: Secondary | ICD-10-CM | POA: Insufficient documentation

## 2018-08-11 DIAGNOSIS — M87059 Idiopathic aseptic necrosis of unspecified femur: Secondary | ICD-10-CM | POA: Insufficient documentation

## 2018-08-11 DIAGNOSIS — Z9049 Acquired absence of other specified parts of digestive tract: Secondary | ICD-10-CM | POA: Insufficient documentation

## 2018-08-11 DIAGNOSIS — K219 Gastro-esophageal reflux disease without esophagitis: Secondary | ICD-10-CM

## 2018-08-11 DIAGNOSIS — I252 Old myocardial infarction: Secondary | ICD-10-CM | POA: Insufficient documentation

## 2018-08-11 DIAGNOSIS — Z86718 Personal history of other venous thrombosis and embolism: Secondary | ICD-10-CM | POA: Insufficient documentation

## 2018-08-11 LAB — BASIC METABOLIC PANEL
Anion gap: 8 (ref 5–15)
BUN: 13 mg/dL (ref 8–23)
CO2: 27 mmol/L (ref 22–32)
Calcium: 8.4 mg/dL — ABNORMAL LOW (ref 8.9–10.3)
Chloride: 102 mmol/L (ref 98–111)
Creatinine, Ser: 1.05 mg/dL (ref 0.61–1.24)
GFR calc Af Amer: >60 mL/min (ref >60)
GFR calc non Af Amer: >60 mL/min (ref >60)
Glucose, Bld: 97 mg/dL (ref 70–99)
Potassium: 3.9 mmol/L (ref 3.5–5.1)
Sodium: 137 mmol/L (ref 135–145)

## 2018-08-11 LAB — CBC
HCT: 42.7 % (ref 39.0–52.0)
Hemoglobin: 13.9 g/dL (ref 13.0–17.0)
MCH: 35.5 pg — ABNORMAL HIGH (ref 26.0–34.0)
MCHC: 32.6 g/dL (ref 30.0–36.0)
MCV: 108.9 fL — ABNORMAL HIGH (ref 80.0–100.0)
Platelets: 132 10*3/uL — ABNORMAL LOW (ref 150–400)
RBC: 3.92 MIL/uL — ABNORMAL LOW (ref 4.22–5.81)
RDW: 13.2 % (ref 11.5–15.5)
WBC: 11.8 10*3/uL — ABNORMAL HIGH (ref 4.0–10.5)
nRBC: 0.2 % (ref 0.0–0.2)

## 2018-08-11 LAB — SURGICAL PCR SCREEN
MRSA, PCR: NEGATIVE
Staphylococcus aureus: NEGATIVE

## 2018-08-11 NOTE — Telephone Encounter (Signed)
Follow up: ° ° °Patient returning call back. Please call patient. °

## 2018-08-11 NOTE — Progress Notes (Addendum)
  Coronavirus Screening  Have you experienced the following symptoms:  Cough No Fever (>100.62F)  No Sore throat No Difficulty breathing/shortness of breath No  Have you or a family member traveled in the last 14 days and where? No   If the patient indicates "YES" to the above questions, their PAT will be rescheduled to limit the exposure to others and, the surgeon will be notified. THE PATIENT WILL NEED TO BE ASYMPTOMATIC FOR 14 DAYS.   If the patient is not experiencing any of these symptoms, the PAT nurse will instruct them to NOT bring anyone with them to their appointment since they may have these symptoms or traveled as well.   Please remind your patients and families that hospital visitation restrictions are in effect and the importance of the restrictions.

## 2018-08-11 NOTE — Progress Notes (Addendum)
PCP - Ronnald Ramp  Cardiologist - Hochrein  Chest x-ray - NA  EKG - 01-28-18(E)  Stress Test - 08-16-14  ECHO - 08-16-14  Cardiac Cath - 11-08-2008  AICD-NA PM-NA LOOP-NA  Sleep Study - Denies CPAP - Denies  LABS-CBC,BMP  ASA-Denies  Stopped SAVAYSA on 08/09/18  HA1C-NA Fasting Blood Sugar -  Checks Blood Sugar _____ times a day  Anesthesia-Y (Extensive cardiac history)  Pt denies having chest pain, sob, or fever at this time. All instructions explained to the pt, with a verbal understanding of the material. Pt agrees to go over the instructions while at home for a better understanding. The opportunity to ask questions was provided.

## 2018-08-11 NOTE — Anesthesia Preprocedure Evaluation (Addendum)
Anesthesia Evaluation  Patient identified by MRN, date of birth, ID band Patient awake    Reviewed: Allergy & Precautions, NPO status , Patient's Chart, lab work & pertinent test results  Airway Mallampati: II  TM Distance: >3 FB Neck ROM: Full    Dental no notable dental hx. (+) Dental Advisory Given, Teeth Intact   Pulmonary neg pulmonary ROS,    Pulmonary exam normal breath sounds clear to auscultation       Cardiovascular hypertension, + CAD, + Past MI and + CABG  Normal cardiovascular exam Rhythm:Regular Rate:Normal     Neuro/Psych TIAnegative psych ROS   GI/Hepatic Neg liver ROS, GERD  ,  Endo/Other  negative endocrine ROS  Renal/GU negative Renal ROS  negative genitourinary   Musculoskeletal  (+) Arthritis , Osteoarthritis,    Abdominal   Peds negative pediatric ROS (+)  Hematology  (+) HIV,   Anesthesia Other Findings   Reproductive/Obstetrics negative OB ROS                          Anesthesia Physical Anesthesia Plan  ASA: III  Anesthesia Plan: General   Post-op Pain Management:    Induction: Intravenous  PONV Risk Score and Plan: 1 and Ondansetron  Airway Management Planned: Oral ETT  Additional Equipment:   Intra-op Plan:   Post-operative Plan: Extubation in OR  Informed Consent: I have reviewed the patients History and Physical, chart, labs and discussed the procedure including the risks, benefits and alternatives for the proposed anesthesia with the patient or authorized representative who has indicated his/her understanding and acceptance.     Dental advisory given  Plan Discussed with: CRNA and Surgeon  Anesthesia Plan Comments: (See PAT note by Karoline Caldwell, PA-C )      Anesthesia Quick Evaluation

## 2018-08-11 NOTE — Progress Notes (Signed)
Anesthesia Chart Review:  Case:  [id number] Date/Time:  08/13/18 0715   Procedure:  LEFT TOTAL HIP ARTHROPLASTY ANTERIOR APPROACH (Left )   Anesthesia type:  Choice   Pre-op diagnosis:  left hip avascular necrosis with femoral head collapse   Location:  [facility] OR ROOM 03 / [place] OR   Surgeon:  Mcarthur Rossetti, MD      DISCUSSION: 68 yo male enver smoker. Pertinent hx includes TIA (mild residual dysarthria), RA, HTN, Fibromyalgia, MI/CAD s/p CABG 2010, Depression, Anxiety, CLL, DVT, GERD, Gout, HIV (follows with ID at [facility]), Carotid artery disease (Stable 1-39% bilateral ICA stenosis, no need for f/u per 2018 [place]), ILD.   Pt follows with Dr. Percival Spanish for history of CAD s/p CABG 2010.  He did have a negative stress perfusion study for any evidence of reversible ischemia in 2016 although there was a fixed defect. Echo 2016 showed EF 55-60% with moderate TR. Last seen by Dr. Percival Spanish 12/24/2017 and per note he was doing well, mowing 4 acres with riding lawnmower. He did have an episode of arm pain but was not felt to be new angina.  Cardiac clearance per telephone encounter 08/11/2018 "[name] was last seen on 12/24/17 by Dr. Percival Spanish.  Since that day, [name] has done well and has been doing well since CABG in 2010.  He meets 4 METS with activity."  Pt previously seen by pulmonology and diagnosed with mild ILD, possibly due to RA.    Anticipate he can proceed as planned barring acute status change.   VS: BP 129/87   Pulse 65   Temp 36.6 C (Oral)   Resp 18   Ht 5\' 9"  (1.753 m)   Wt 90.4 kg   SpO2 99%   BMI 29.42 kg/m   PROVIDERS: [name], MD is PCP  [name], MD is Cardiologist  Zola Button, MD is Oncologist  Bobby Rumpf, MD is ID physician  Brand Males, MD is Pulmonologist  LABS: Labs reviewed: Acceptable for surgery. (all labs ordered are listed, but only abnormal results are displayed)  Labs Reviewed  CBC - Abnormal; Notable for the  following components:      Result Value   WBC 11.8 (*)    RBC 3.92 (*)    MCV 108.9 (*)    MCH 35.5 (*)    Platelets 132 (*)    All other components within normal limits  BASIC METABOLIC PANEL - Abnormal; Notable for the following components:   Calcium 8.4 (*)    All other components within normal limits  SURGICAL PCR SCREEN    PULM: Spirometry 01/24/2016: FVC 93% FEV1 98% FEV1/FVC 78% DLCO 65%  EKG: 01/28/2018: Sinus rhythm. Rate 64. Anteroseptal infarct, age indeterminate  CV: Nuclear stress 08/16/2014: Overall Impression:  Intermediate risk stress nuclear study with moderate-size and intensity, fixed apical/septal defect suggestive of scar. No reversible ischemia.  LV Wall Motion:  Septal hypokinesis/dyskineis and apical akinesis, EF 50%  TTE 08/16/2014: Study Conclusions  - Left ventricle: The cavity size was normal. Systolic function was normal. The estimated ejection fraction was in the range of 55% to 60%. There was no evidence of elevated ventricular filling pressure by Doppler parameters. - Aortic valve: There was mild regurgitation. - Left atrium: The atrium was moderately dilated. - Right ventricle: The cavity size was mildly dilated. - Right atrium: The atrium was mildly dilated. - Tricuspid valve: There was moderate regurgitation.   Past Medical History:  Diagnosis Date  . Allergy   .  Anxiety   . Carotid artery occlusion    40-60% right ICA stenosis (09/2008)  . Cataract   . Chronic back pain   . CLL (chronic lymphoblastic leukemia) dx 2010   Followed at [facility] q44mo, no current therapy   . Clotting disorder ([place])   . Coronary artery disease 2010   s/p CABG '10, sees Dr. Percival Spanish  . Depression   . Diverticulosis   . DVT, lower extremity, recurrent ([place]) 2008, 2009   LLE, chronic anticoag since 2009  . Esophagitis   . Fibromyalgia   . Gallstones   . GERD (gastroesophageal reflux disease)   . Gout   . Gynecomastia, male   . H/O hiatal hernia  2008   surgery  . Hemorrhoids   . Hepatitis A yrs ago  . HIV infection ([place]) dx 1993  . Hypertension   . Impotence of organic origin   . Myocardial infarction ([place]) 2010    x 2  . Neuromuscular disorder ([facility])    neuropathy  . Osteoarthritis, knee    s/p B TKA  . Osteoporosis   . Pneumonia mrach, may, july 2017  . Rheumatoid arthritis(714.0) dx 2010   MTX, follows with rheum  . Seasonal allergies   . Secondary syphilis 07/24/14 dx   s/p 2 wks doxy  . Status post dilation of esophageal narrowing   . Stroke ([place]) [place]   . TIA (transient ischemic attack) 1997   mild residual L mouth droop  . Tubular adenoma of colon     Past Surgical History:  Procedure Laterality Date  . CHOLECYSTECTOMY    . COLONOSCOPY WITH PROPOFOL N/A 12/28/2012   Procedure: COLONOSCOPY WITH PROPOFOL;  Surgeon: Jerene Bears, MD;  Location: [facility] END[facility];  Service: Gastroenterology;  Laterality: N/A;  . CORONARY ARTERY BYPASS GRAFT  2010   triple bypass  . ESOPHAGOGASTRODUODENOSCOPY (EGD) WITH PROPOFOL N/A 12/28/2012   Procedure: ESOPHAGOGASTRODUODENOSCOPY (EGD) WITH PROPOFOL;  Surgeon: Jerene Bears, MD;  [facility];  Service: Gastroenterology;  Laterality: N/A;  . ESOPHAGOGASTRODUODENOSCOPY (EGD) WITH PROPOFOL N/A 03/15/2013   Procedure: ESOPHAGOGASTRODUODENOSCOPY (EGD) WITH PROPOFOL;  Surgeon: Jerene Bears, MD;  [facility];  Service: Gastroenterology;  Laterality: N/A;  . ESOPHAGOGASTRODUODENOSCOPY (EGD) WITH PROPOFOL N/A 02/07/2016   Procedure: ESOPHAGOGASTRODUODENOSCOPY (EGD) WITH PROPOFOL;  Surgeon: Jerene Bears, MD;  [facility]: [facility];  Service: Gastroenterology;  Laterality: N/A;  . HARDWARE REMOVAL N/A 07/02/2012   Procedure: HARDWARE REMOVAL;  Surgeon: Elaina Hoops, MD;  Location: [place] NEURO ORS;  Service: Neurosurgery;  Laterality: N/A;  . HIATAL HERNIA REPAIR     wrap  . INGUINAL HERNIA REPAIR Bilateral   . JOINT REPLACEMENT Left 1999  . KNEE ARTHROPLASTY  07/22/2011    Procedure: COMPUTER ASSISTED TOTAL KNEE ARTHROPLASTY;  Surgeon: Meredith Pel, MD;  Location: [place];  Service: Orthopedics;  Laterality: Right;  Right total knee arthroplasty  . MANDIBLE SURGERY Bilateral    tmj  . REPLACEMENT TOTAL KNEE Bilateral   . ring around testicle hernia reapir  184 and 1986   x 2  . ROTATOR CUFF REPAIR Right   . SHOULDER SURGERY Left   . SPINE SURGERY  2010   "rod and screws", "failed", lopwer spine,   . stent to heart x 1  2010  . TONSILLECTOMY    . UMBILICAL HERNIA REPAIR     x 1  . varicose vein     stripping  . VIDEO BRONCHOSCOPY Bilateral 10/16/2015   Procedure:  VIDEO BRONCHOSCOPY WITHOUT FLUORO;  Surgeon: Brand Males, MD;  [facility];  Service: Cardiopulmonary;  Laterality: Bilateral;    MEDICATIONS: . acetaminophen-codeine (TYLENOL #3) 300-30 MG tablet  . atorvastatin (LIPITOR) 10 MG tablet  . clindamycin (CLEOCIN) 150 MG capsule  . darunavir (PREZISTA) 800 MG tablet  . Dermatological Products, Misc. [facility]) lotion  . elvitegravir-cobicistat-emtricitabine-tenofovir (GENVOYA) 150-150-200-10 MG TABS tablet  . escitalopram (LEXAPRO) 20 MG tablet  . famotidine (PEPCID) 40 MG tablet  . folic acid (FOLVITE) 1 MG tablet  . furosemide (LASIX) 20 MG tablet  . HYDROcodone-acetaminophen (NORCO/VICODIN) 5-325 MG tablet  . leflunomide (ARAVA) 20 MG tablet  . nitroGLYCERIN (NITROSTAT) 0.4 MG SL tablet  . predniSONE (DELTASONE) 5 MG tablet  . pregabalin (LYRICA) 200 MG capsule  . REXULTI 0.25 MG TABS  . SAVAYSA 30 MG TABS tablet  . sulfamethoxazole-trimethoprim (BACTRIM DS,SEPTRA DS) 800-160 MG tablet  . thiamine 100 MG tablet  . valACYclovir (VALTREX) 500 MG tablet   No current facility-administered medications for this encounter.     Wynonia Musty [facility] Short Stay Center/Anesthesiology Phone [phone] 08/11/2018 1:51 PM

## 2018-08-11 NOTE — Telephone Encounter (Signed)
   Primary Cardiologist: [name], MD  Chart reviewed as part of pre-operative protocol coverage. Patient was contacted 08/11/2018 in reference to pre-operative risk assessment for pending surgery as outlined below.  [name] was last seen on 12/24/17 by Dr. Percival Spanish.  Since that day, [name] has done well and has been doing well since CABG in 2010.  He meets 4 METS with activity.  Therefore, based on ACC/AHA guidelines, the patient would be at acceptable risk for the planned procedure without further cardiovascular testing.   I will route this recommendation to the requesting party via Epic fax function and remove from pre-op pool.  Please call with questions.  Cecilie Kicks, NP 08/11/2018, 12:58 PM

## 2018-08-12 NOTE — Progress Notes (Signed)
Patient called and screened for COVID 19. Denies any travel outside of the state, no fever, shortness of breath, cough, sore throat, or sneezing.

## 2018-08-13 ENCOUNTER — Ambulatory Visit (HOSPITAL_COMMUNITY): Payer: Medicare Other | Admitting: Physician Assistant

## 2018-08-13 ENCOUNTER — Inpatient Hospital Stay (HOSPITAL_COMMUNITY)
Admission: RE | Admit: 2018-08-13 | Discharge: 2018-08-20 | DRG: 466 | Disposition: A | Discharge status: Home Health | Payer: Medicare Other | Attending: Orthopaedic Surgery | Admitting: Orthopaedic Surgery

## 2018-08-13 ENCOUNTER — Ambulatory Visit (HOSPITAL_COMMUNITY): Payer: Medicare Other

## 2018-08-13 ENCOUNTER — Encounter (HOSPITAL_COMMUNITY)
Admission: RE | Disposition: A | Discharge status: Home Health | Payer: Self-pay | Source: Home / Self Care | Attending: Orthopaedic Surgery

## 2018-08-13 ENCOUNTER — Inpatient Hospital Stay (HOSPITAL_COMMUNITY): Payer: Medicare Other

## 2018-08-13 ENCOUNTER — Encounter (HOSPITAL_COMMUNITY): Payer: Self-pay | Admitting: Certified Registered"

## 2018-08-13 DIAGNOSIS — I10 Essential (primary) hypertension: Secondary | ICD-10-CM | POA: Diagnosis present

## 2018-08-13 DIAGNOSIS — D62 Acute posthemorrhagic anemia: Secondary | ICD-10-CM | POA: Diagnosis not present

## 2018-08-13 DIAGNOSIS — R52 Pain, unspecified: Secondary | ICD-10-CM

## 2018-08-13 DIAGNOSIS — X58XXXA Exposure to other specified factors, initial encounter: Secondary | ICD-10-CM | POA: Diagnosis present

## 2018-08-13 DIAGNOSIS — G629 Polyneuropathy, unspecified: Secondary | ICD-10-CM | POA: Diagnosis present

## 2018-08-13 DIAGNOSIS — S7292XA Unspecified fracture of left femur, initial encounter for closed fracture: Secondary | ICD-10-CM | POA: Diagnosis present

## 2018-08-13 DIAGNOSIS — S73005A Unspecified dislocation of left hip, initial encounter: Secondary | ICD-10-CM | POA: Diagnosis not present

## 2018-08-13 DIAGNOSIS — M25552 Pain in left hip: Secondary | ICD-10-CM | POA: Diagnosis present

## 2018-08-13 DIAGNOSIS — Z96642 Presence of left artificial hip joint: Secondary | ICD-10-CM

## 2018-08-13 DIAGNOSIS — I082 Rheumatic disorders of both aortic and tricuspid valves: Secondary | ICD-10-CM | POA: Diagnosis present

## 2018-08-13 DIAGNOSIS — M879 Osteonecrosis, unspecified: Secondary | ICD-10-CM | POA: Diagnosis not present

## 2018-08-13 DIAGNOSIS — Z452 Encounter for adjustment and management of vascular access device: Secondary | ICD-10-CM

## 2018-08-13 DIAGNOSIS — L309 Dermatitis, unspecified: Secondary | ICD-10-CM | POA: Diagnosis present

## 2018-08-13 DIAGNOSIS — T84091A Other mechanical complication of internal left hip prosthesis, initial encounter: Secondary | ICD-10-CM | POA: Diagnosis not present

## 2018-08-13 DIAGNOSIS — M25359 Other instability, unspecified hip: Secondary | ICD-10-CM | POA: Diagnosis not present

## 2018-08-13 DIAGNOSIS — F329 Major depressive disorder, single episode, unspecified: Secondary | ICD-10-CM | POA: Diagnosis present

## 2018-08-13 DIAGNOSIS — K219 Gastro-esophageal reflux disease without esophagitis: Secondary | ICD-10-CM | POA: Diagnosis present

## 2018-08-13 DIAGNOSIS — G609 Hereditary and idiopathic neuropathy, unspecified: Secondary | ICD-10-CM | POA: Diagnosis present

## 2018-08-13 DIAGNOSIS — N529 Male erectile dysfunction, unspecified: Secondary | ICD-10-CM | POA: Diagnosis present

## 2018-08-13 DIAGNOSIS — M87052 Idiopathic aseptic necrosis of left femur: Secondary | ICD-10-CM

## 2018-08-13 DIAGNOSIS — Z91018 Allergy to other foods: Secondary | ICD-10-CM

## 2018-08-13 DIAGNOSIS — M810 Age-related osteoporosis without current pathological fracture: Secondary | ICD-10-CM | POA: Diagnosis present

## 2018-08-13 DIAGNOSIS — Q219 Congenital malformation of cardiac septum, unspecified: Secondary | ICD-10-CM

## 2018-08-13 DIAGNOSIS — M40209 Unspecified kyphosis, site unspecified: Secondary | ICD-10-CM | POA: Diagnosis not present

## 2018-08-13 DIAGNOSIS — Y792 Prosthetic and other implants, materials and accessory orthopedic devices associated with adverse incidents: Secondary | ICD-10-CM | POA: Diagnosis not present

## 2018-08-13 DIAGNOSIS — Z91010 Allergy to peanuts: Secondary | ICD-10-CM

## 2018-08-13 DIAGNOSIS — Z96653 Presence of artificial knee joint, bilateral: Secondary | ICD-10-CM | POA: Diagnosis present

## 2018-08-13 DIAGNOSIS — M25752 Osteophyte, left hip: Secondary | ICD-10-CM | POA: Diagnosis not present

## 2018-08-13 DIAGNOSIS — Z8249 Family history of ischemic heart disease and other diseases of the circulatory system: Secondary | ICD-10-CM

## 2018-08-13 DIAGNOSIS — B20 Human immunodeficiency virus [HIV] disease: Secondary | ICD-10-CM | POA: Diagnosis present

## 2018-08-13 DIAGNOSIS — I2510 Atherosclerotic heart disease of native coronary artery without angina pectoris: Secondary | ICD-10-CM | POA: Diagnosis present

## 2018-08-13 DIAGNOSIS — Z8349 Family history of other endocrine, nutritional and metabolic diseases: Secondary | ICD-10-CM

## 2018-08-13 DIAGNOSIS — M109 Gout, unspecified: Secondary | ICD-10-CM | POA: Diagnosis present

## 2018-08-13 DIAGNOSIS — Z885 Allergy status to narcotic agent status: Secondary | ICD-10-CM

## 2018-08-13 DIAGNOSIS — M797 Fibromyalgia: Secondary | ICD-10-CM | POA: Diagnosis present

## 2018-08-13 DIAGNOSIS — M069 Rheumatoid arthritis, unspecified: Secondary | ICD-10-CM | POA: Diagnosis present

## 2018-08-13 DIAGNOSIS — I252 Old myocardial infarction: Secondary | ICD-10-CM

## 2018-08-13 DIAGNOSIS — E785 Hyperlipidemia, unspecified: Secondary | ICD-10-CM | POA: Diagnosis present

## 2018-08-13 DIAGNOSIS — J302 Other seasonal allergic rhinitis: Secondary | ICD-10-CM | POA: Diagnosis present

## 2018-08-13 DIAGNOSIS — F419 Anxiety disorder, unspecified: Secondary | ICD-10-CM | POA: Diagnosis present

## 2018-08-13 DIAGNOSIS — Z981 Arthrodesis status: Secondary | ICD-10-CM | POA: Diagnosis not present

## 2018-08-13 DIAGNOSIS — Z419 Encounter for procedure for purposes other than remedying health state, unspecified: Secondary | ICD-10-CM

## 2018-08-13 DIAGNOSIS — Z471 Aftercare following joint replacement surgery: Secondary | ICD-10-CM | POA: Diagnosis not present

## 2018-08-13 DIAGNOSIS — Z823 Family history of stroke: Secondary | ICD-10-CM

## 2018-08-13 DIAGNOSIS — Z888 Allergy status to other drugs, medicaments and biological substances status: Secondary | ICD-10-CM

## 2018-08-13 DIAGNOSIS — S73005S Unspecified dislocation of left hip, sequela: Secondary | ICD-10-CM

## 2018-08-13 DIAGNOSIS — Z6829 Body mass index (BMI) 29.0-29.9, adult: Secondary | ICD-10-CM

## 2018-08-13 DIAGNOSIS — M25452 Effusion, left hip: Secondary | ICD-10-CM | POA: Diagnosis present

## 2018-08-13 DIAGNOSIS — T84021A Dislocation of internal left hip prosthesis, initial encounter: Secondary | ICD-10-CM | POA: Diagnosis not present

## 2018-08-13 DIAGNOSIS — C9110 Chronic lymphocytic leukemia of B-cell type not having achieved remission: Secondary | ICD-10-CM | POA: Diagnosis present

## 2018-08-13 DIAGNOSIS — E669 Obesity, unspecified: Secondary | ICD-10-CM | POA: Diagnosis present

## 2018-08-13 DIAGNOSIS — Z880 Allergy status to penicillin: Secondary | ICD-10-CM

## 2018-08-13 DIAGNOSIS — Z8673 Personal history of transient ischemic attack (TIA), and cerebral infarction without residual deficits: Secondary | ICD-10-CM

## 2018-08-13 DIAGNOSIS — Z8719 Personal history of other diseases of the digestive system: Secondary | ICD-10-CM

## 2018-08-13 DIAGNOSIS — J8410 Pulmonary fibrosis, unspecified: Secondary | ICD-10-CM | POA: Diagnosis present

## 2018-08-13 HISTORY — PX: TOTAL HIP ARTHROPLASTY: SHX124

## 2018-08-13 SURGERY — ARTHROPLASTY, HIP, TOTAL, ANTERIOR APPROACH
Anesthesia: General | Site: Hip | Laterality: Left

## 2018-08-13 MED ORDER — SUGAMMADEX SODIUM 200 MG/2ML IV SOLN
INTRAVENOUS | Status: DC | PRN
  Administered 2018-08-13: 300 mg via INTRAVENOUS

## 2018-08-13 MED ORDER — METHOCARBAMOL 500 MG PO TABS
500.0000 mg | ORAL_TABLET | Freq: Four times a day (QID) | ORAL | Status: DC | PRN
  Administered 2018-08-17: 500 mg via ORAL
  Filled 2018-08-13: qty 1

## 2018-08-13 MED ORDER — SODIUM CHLORIDE 0.9 % IV SOLN
INTRAVENOUS | Status: DC | PRN
  Administered 2018-08-13: 60 ug/min via INTRAVENOUS

## 2018-08-13 MED ORDER — VALACYCLOVIR HCL 500 MG PO TABS
500.0000 mg | ORAL_TABLET | Freq: Every day | ORAL | Status: DC
  Administered 2018-08-14 – 2018-08-20 (×6): 500 mg via ORAL
  Filled 2018-08-13 (×10): qty 1

## 2018-08-13 MED ORDER — PROPOFOL 10 MG/ML IV BOLUS
INTRAVENOUS | Status: DC | PRN
  Administered 2018-08-13: 100 mg via INTRAVENOUS

## 2018-08-13 MED ORDER — SORBITOL 70 % SOLN
30.0000 mL | Freq: Every day | Status: DC | PRN
  Administered 2018-08-16: 30 mL via ORAL
  Filled 2018-08-13 (×2): qty 30

## 2018-08-13 MED ORDER — TRANEXAMIC ACID-NACL 1000-0.7 MG/100ML-% IV SOLN
INTRAVENOUS | Status: AC
  Filled 2018-08-13: qty 100

## 2018-08-13 MED ORDER — PHENOL 1.4 % MT LIQD: 1.0000 | OROMUCOSAL | Status: DC | PRN

## 2018-08-13 MED ORDER — METOCLOPRAMIDE HCL 5 MG PO TABS: 5.0000 mg | ORAL_TABLET | Freq: Three times a day (TID) | ORAL | Status: DC | PRN

## 2018-08-13 MED ORDER — PHENYLEPHRINE 40 MCG/ML (10ML) SYRINGE FOR IV PUSH (FOR BLOOD PRESSURE SUPPORT)
PREFILLED_SYRINGE | INTRAVENOUS | Status: AC
  Filled 2018-08-13: qty 10

## 2018-08-13 MED ORDER — CLINDAMYCIN PHOSPHATE 600 MG/50ML IV SOLN
600.0000 mg | Freq: Four times a day (QID) | INTRAVENOUS | Status: AC
  Administered 2018-08-13 (×2): 600 mg via INTRAVENOUS
  Filled 2018-08-13 (×2): qty 50

## 2018-08-13 MED ORDER — PREDNISONE 5 MG PO TABS
5.0000 mg | ORAL_TABLET | Freq: Two times a day (BID) | ORAL | Status: DC
  Administered 2018-08-13 – 2018-08-20 (×13): 5 mg via ORAL
  Filled 2018-08-13 (×13): qty 1

## 2018-08-13 MED ORDER — DOCUSATE SODIUM 100 MG PO CAPS
100.0000 mg | ORAL_CAPSULE | Freq: Two times a day (BID) | ORAL | Status: DC
  Administered 2018-08-13 – 2018-08-20 (×13): 100 mg via ORAL
  Filled 2018-08-13 (×13): qty 1

## 2018-08-13 MED ORDER — EPHEDRINE 5 MG/ML INJ
INTRAVENOUS | Status: AC
  Filled 2018-08-13: qty 10

## 2018-08-13 MED ORDER — SODIUM CHLORIDE 0.9 % IR SOLN
Status: DC | PRN
  Administered 2018-08-13: 3000 mL

## 2018-08-13 MED ORDER — ALUM & MAG HYDROXIDE-SIMETH 200-200-20 MG/5ML PO SUSP
30.0000 mL | ORAL | Status: DC | PRN
  Filled 2018-08-13: qty 30

## 2018-08-13 MED ORDER — FUROSEMIDE 20 MG PO TABS
20.0000 mg | ORAL_TABLET | Freq: Two times a day (BID) | ORAL | Status: DC
  Administered 2018-08-13 – 2018-08-20 (×13): 20 mg via ORAL
  Filled 2018-08-13 (×13): qty 1

## 2018-08-13 MED ORDER — HYDROMORPHONE HCL 1 MG/ML IJ SOLN: 0.2500 mg | INTRAMUSCULAR | Status: DC | PRN

## 2018-08-13 MED ORDER — CLINDAMYCIN PHOSPHATE 900 MG/50ML IV SOLN
900.0000 mg | INTRAVENOUS | Status: AC
  Administered 2018-08-13: 900 mg via INTRAVENOUS

## 2018-08-13 MED ORDER — DEXAMETHASONE SODIUM PHOSPHATE 10 MG/ML IJ SOLN
INTRAMUSCULAR | Status: AC
  Filled 2018-08-13: qty 1

## 2018-08-13 MED ORDER — LIDOCAINE 2% (20 MG/ML) 5 ML SYRINGE
INTRAMUSCULAR | Status: DC | PRN
  Administered 2018-08-13: 60 mg via INTRAVENOUS

## 2018-08-13 MED ORDER — 0.9 % SODIUM CHLORIDE (POUR BTL) OPTIME
TOPICAL | Status: DC | PRN
  Administered 2018-08-13: 09:00:00 1000 mL

## 2018-08-13 MED ORDER — DEXAMETHASONE SODIUM PHOSPHATE 10 MG/ML IJ SOLN
INTRAMUSCULAR | Status: DC | PRN
  Administered 2018-08-13: 10 mg via INTRAVENOUS

## 2018-08-13 MED ORDER — ROCURONIUM BROMIDE 50 MG/5ML IV SOSY
PREFILLED_SYRINGE | INTRAVENOUS | Status: DC | PRN
  Administered 2018-08-13: 50 mg via INTRAVENOUS

## 2018-08-13 MED ORDER — BREXPIPRAZOLE 0.5 MG PO TABS
0.2500 mg | ORAL_TABLET | Freq: Every day | ORAL | Status: DC
  Filled 2018-08-13: qty 1

## 2018-08-13 MED ORDER — FENTANYL CITRATE (PF) 250 MCG/5ML IJ SOLN
INTRAMUSCULAR | Status: AC
  Filled 2018-08-13: qty 5

## 2018-08-13 MED ORDER — MIDAZOLAM HCL 5 MG/5ML IJ SOLN
INTRAMUSCULAR | Status: DC | PRN
  Administered 2018-08-13: 1 mg via INTRAVENOUS

## 2018-08-13 MED ORDER — ESCITALOPRAM OXALATE 20 MG PO TABS
20.0000 mg | ORAL_TABLET | Freq: Every day | ORAL | Status: DC
  Administered 2018-08-14 – 2018-08-20 (×6): 20 mg via ORAL
  Filled 2018-08-13 (×6): qty 1

## 2018-08-13 MED ORDER — CHLORHEXIDINE GLUCONATE 4 % EX LIQD: 60.0000 mL | Freq: Once | CUTANEOUS | Status: DC

## 2018-08-13 MED ORDER — MIDAZOLAM HCL 2 MG/2ML IJ SOLN
INTRAMUSCULAR | Status: AC
  Filled 2018-08-13: qty 2

## 2018-08-13 MED ORDER — FAMOTIDINE 20 MG PO TABS
40.0000 mg | ORAL_TABLET | Freq: Every day | ORAL | Status: DC
  Administered 2018-08-14 – 2018-08-20 (×6): 40 mg via ORAL
  Filled 2018-08-13 (×6): qty 2

## 2018-08-13 MED ORDER — ELVITEG-COBIC-EMTRICIT-TENOFAF 150-150-200-10 MG PO TABS
1.0000 | ORAL_TABLET | Freq: Every day | ORAL | Status: DC
  Administered 2018-08-14 – 2018-08-20 (×6): 1 via ORAL
  Filled 2018-08-13 (×8): qty 1

## 2018-08-13 MED ORDER — HYDROMORPHONE HCL 1 MG/ML IJ SOLN
0.5000 mg | INTRAMUSCULAR | Status: DC | PRN
  Administered 2018-08-15 – 2018-08-17 (×3): 1 mg via INTRAVENOUS
  Filled 2018-08-13 (×3): qty 1

## 2018-08-13 MED ORDER — LEFLUNOMIDE 20 MG PO TABS
20.0000 mg | ORAL_TABLET | Freq: Every day | ORAL | Status: DC
  Administered 2018-08-14 – 2018-08-20 (×6): 20 mg via ORAL
  Filled 2018-08-13 (×8): qty 1

## 2018-08-13 MED ORDER — SUCCINYLCHOLINE CHLORIDE 200 MG/10ML IV SOSY
PREFILLED_SYRINGE | INTRAVENOUS | Status: DC | PRN
  Administered 2018-08-13: 100 mg via INTRAVENOUS

## 2018-08-13 MED ORDER — LACTATED RINGERS IV SOLN
INTRAVENOUS | Status: DC | PRN
  Administered 2018-08-13 (×2): via INTRAVENOUS

## 2018-08-13 MED ORDER — PHENYLEPHRINE 40 MCG/ML (10ML) SYRINGE FOR IV PUSH (FOR BLOOD PRESSURE SUPPORT)
PREFILLED_SYRINGE | INTRAVENOUS | Status: DC | PRN
  Administered 2018-08-13: 80 ug via INTRAVENOUS

## 2018-08-13 MED ORDER — OXYCODONE HCL 5 MG PO TABS
5.0000 mg | ORAL_TABLET | ORAL | Status: DC | PRN
  Administered 2018-08-17: 5 mg via ORAL

## 2018-08-13 MED ORDER — METHOCARBAMOL 1000 MG/10ML IJ SOLN
500.0000 mg | Freq: Four times a day (QID) | INTRAVENOUS | Status: DC | PRN
  Filled 2018-08-13: qty 5

## 2018-08-13 MED ORDER — OXYCODONE HCL 5 MG PO TABS
10.0000 mg | ORAL_TABLET | ORAL | Status: DC | PRN
  Administered 2018-08-13: 15 mg via ORAL
  Filled 2018-08-13: qty 3
  Filled 2018-08-13: qty 2

## 2018-08-13 MED ORDER — PROPOFOL 10 MG/ML IV BOLUS
INTRAVENOUS | Status: AC
  Filled 2018-08-13: qty 20

## 2018-08-13 MED ORDER — DIPHENHYDRAMINE HCL 12.5 MG/5ML PO ELIX: 12.5000 mg | ORAL_SOLUTION | ORAL | Status: DC | PRN

## 2018-08-13 MED ORDER — POLYETHYLENE GLYCOL 3350 17 G PO PACK
17.0000 g | PACK | Freq: Every day | ORAL | Status: DC | PRN
  Administered 2018-08-19: 17 g via ORAL
  Filled 2018-08-13: qty 1

## 2018-08-13 MED ORDER — PREGABALIN 75 MG PO CAPS
200.0000 mg | ORAL_CAPSULE | Freq: Two times a day (BID) | ORAL | Status: DC
  Administered 2018-08-13 – 2018-08-20 (×13): 200 mg via ORAL
  Filled 2018-08-13 (×13): qty 1

## 2018-08-13 MED ORDER — ACETAMINOPHEN 325 MG PO TABS: 325.0000 mg | ORAL_TABLET | Freq: Four times a day (QID) | ORAL | Status: DC | PRN

## 2018-08-13 MED ORDER — ONDANSETRON HCL 4 MG/2ML IJ SOLN
INTRAMUSCULAR | Status: DC | PRN
  Administered 2018-08-13: 4 mg via INTRAVENOUS

## 2018-08-13 MED ORDER — METOCLOPRAMIDE HCL 5 MG/ML IJ SOLN: 5.0000 mg | Freq: Three times a day (TID) | INTRAMUSCULAR | Status: DC | PRN

## 2018-08-13 MED ORDER — EDOXABAN TOSYLATE 30 MG PO TABS
30.0000 mg | ORAL_TABLET | ORAL | Status: DC
  Administered 2018-08-14 (×2): 30 mg via ORAL
  Filled 2018-08-13 (×2): qty 1

## 2018-08-13 MED ORDER — ONDANSETRON HCL 4 MG PO TABS
4.0000 mg | ORAL_TABLET | Freq: Four times a day (QID) | ORAL | Status: DC | PRN
  Administered 2018-08-13: 4 mg via ORAL
  Filled 2018-08-13 (×2): qty 1

## 2018-08-13 MED ORDER — BREXPIPRAZOLE 1 MG PO TABS
0.2500 mg | ORAL_TABLET | Freq: Every day | ORAL | Status: DC
  Administered 2018-08-14 – 2018-08-16 (×3): 0.25 mg via ORAL
  Filled 2018-08-13 (×4): qty 1

## 2018-08-13 MED ORDER — LIDOCAINE 2% (20 MG/ML) 5 ML SYRINGE
INTRAMUSCULAR | Status: AC
  Filled 2018-08-13: qty 5

## 2018-08-13 MED ORDER — ATORVASTATIN CALCIUM 10 MG PO TABS
10.0000 mg | ORAL_TABLET | Freq: Every day | ORAL | Status: DC
  Administered 2018-08-13 – 2018-08-19 (×7): 10 mg via ORAL
  Filled 2018-08-13 (×7): qty 1

## 2018-08-13 MED ORDER — FOLIC ACID 1 MG PO TABS
1.0000 mg | ORAL_TABLET | Freq: Every day | ORAL | Status: DC
  Administered 2018-08-14 – 2018-08-20 (×6): 1 mg via ORAL
  Filled 2018-08-13 (×6): qty 1

## 2018-08-13 MED ORDER — PROMETHAZINE HCL 25 MG/ML IJ SOLN: 6.2500 mg | INTRAMUSCULAR | Status: DC | PRN

## 2018-08-13 MED ORDER — CLINDAMYCIN PHOSPHATE 900 MG/50ML IV SOLN
INTRAVENOUS | Status: AC
  Filled 2018-08-13: qty 50

## 2018-08-13 MED ORDER — MENTHOL 3 MG MT LOZG: 1.0000 | LOZENGE | OROMUCOSAL | Status: DC | PRN

## 2018-08-13 MED ORDER — EPHEDRINE SULFATE-NACL 50-0.9 MG/10ML-% IV SOSY
PREFILLED_SYRINGE | INTRAVENOUS | Status: DC | PRN
  Administered 2018-08-13: 5 mg via INTRAVENOUS

## 2018-08-13 MED ORDER — ONDANSETRON HCL 4 MG/2ML IJ SOLN
4.0000 mg | Freq: Four times a day (QID) | INTRAMUSCULAR | Status: DC | PRN
  Administered 2018-08-13: 4 mg via INTRAVENOUS
  Filled 2018-08-13: qty 2

## 2018-08-13 MED ORDER — FENTANYL CITRATE (PF) 100 MCG/2ML IJ SOLN
INTRAMUSCULAR | Status: DC | PRN
  Administered 2018-08-13: 50 ug via INTRAVENOUS
  Administered 2018-08-13: 100 ug via INTRAVENOUS
  Administered 2018-08-13 (×2): 50 ug via INTRAVENOUS
  Administered 2018-08-13: 100 ug via INTRAVENOUS

## 2018-08-13 MED ORDER — ONDANSETRON HCL 4 MG/2ML IJ SOLN
INTRAMUSCULAR | Status: AC
  Filled 2018-08-13: qty 2

## 2018-08-13 MED ORDER — TRANEXAMIC ACID-NACL 1000-0.7 MG/100ML-% IV SOLN
INTRAVENOUS | Status: DC | PRN
  Administered 2018-08-13: 1000 mg via INTRAVENOUS

## 2018-08-13 MED ORDER — ROCURONIUM BROMIDE 50 MG/5ML IV SOSY
PREFILLED_SYRINGE | INTRAVENOUS | Status: AC
  Filled 2018-08-13: qty 5

## 2018-08-13 MED ORDER — DARUNAVIR ETHANOLATE 800 MG PO TABS
800.0000 mg | ORAL_TABLET | Freq: Every day | ORAL | Status: DC
  Administered 2018-08-14 – 2018-08-20 (×6): 800 mg via ORAL
  Filled 2018-08-13 (×6): qty 1

## 2018-08-13 MED ORDER — SODIUM CHLORIDE 0.9 % IV SOLN
INTRAVENOUS | Status: DC
  Administered 2018-08-13 – 2018-08-18 (×2): via INTRAVENOUS

## 2018-08-13 MED ORDER — SUCCINYLCHOLINE CHLORIDE 200 MG/10ML IV SOSY
PREFILLED_SYRINGE | INTRAVENOUS | Status: AC
  Filled 2018-08-13: qty 10

## 2018-08-13 SURGICAL SUPPLY — 54 items
ACETAB CUP W/GRIPTION 54 (Plate) ×2 IMPLANT
ARTICULEZE HEAD (Hips) ×2 IMPLANT
BENZOIN TINCTURE PRP APPL 2/3 (GAUZE/BANDAGES/DRESSINGS) ×2 IMPLANT
BLADE CLIPPER SURG (BLADE) IMPLANT
BLADE SAW SGTL 18X1.27X75 (BLADE) ×2 IMPLANT
COVER SURGICAL LIGHT HANDLE (MISCELLANEOUS) ×2 IMPLANT
COVER WAND RF STERILE (DRAPES) ×2 IMPLANT
CUP ACETAB W/GRIPTION 54 (Plate) ×1 IMPLANT
DRAPE C-ARM 42X72 X-RAY (DRAPES) ×2 IMPLANT
DRAPE STERI IOBAN 125X83 (DRAPES) ×2 IMPLANT
DRAPE U-SHAPE 47X51 STRL (DRAPES) ×6 IMPLANT
DRSG AQUACEL AG ADV 3.5X10 (GAUZE/BANDAGES/DRESSINGS) ×2 IMPLANT
DURAPREP 26ML APPLICATOR (WOUND CARE) ×2 IMPLANT
ELECT BLADE 4.0 EZ CLEAN MEGAD (MISCELLANEOUS) ×2
ELECT BLADE 6.5 EXT (BLADE) IMPLANT
ELECT REM PT RETURN 9FT ADLT (ELECTROSURGICAL) ×2
ELECTRODE BLDE 4.0 EZ CLN MEGD (MISCELLANEOUS) ×1 IMPLANT
ELECTRODE REM PT RTRN 9FT ADLT (ELECTROSURGICAL) ×1 IMPLANT
FACESHIELD WRAPAROUND (MASK) ×4 IMPLANT
GLOVE BIOGEL PI IND STRL 8 (GLOVE) ×2 IMPLANT
GLOVE BIOGEL PI INDICATOR 8 (GLOVE) ×2
GLOVE ECLIPSE 8.0 STRL XLNG CF (GLOVE) ×2 IMPLANT
GLOVE ORTHO TXT STRL SZ7.5 (GLOVE) ×4 IMPLANT
GOWN STRL REUS W/ TWL LRG LVL3 (GOWN DISPOSABLE) ×2 IMPLANT
GOWN STRL REUS W/ TWL XL LVL3 (GOWN DISPOSABLE) ×2 IMPLANT
GOWN STRL REUS W/TWL LRG LVL3 (GOWN DISPOSABLE) ×2
GOWN STRL REUS W/TWL XL LVL3 (GOWN DISPOSABLE) ×2
HANDPIECE INTERPULSE COAX TIP (DISPOSABLE) ×1
HEAD ARTICULEZE (Hips) ×1 IMPLANT
KIT BASIN OR (CUSTOM PROCEDURE TRAY) ×2 IMPLANT
KIT TURNOVER KIT B (KITS) ×2 IMPLANT
LINER NEUTRAL 54X36MM PLUS 4 (Hips) ×2 IMPLANT
MANIFOLD NEPTUNE II (INSTRUMENTS) ×2 IMPLANT
NS IRRIG 1000ML POUR BTL (IV SOLUTION) ×2 IMPLANT
PACK TOTAL JOINT (CUSTOM PROCEDURE TRAY) ×2 IMPLANT
PAD ARMBOARD 7.5X6 YLW CONV (MISCELLANEOUS) ×2 IMPLANT
SET HNDPC FAN SPRY TIP SCT (DISPOSABLE) ×1 IMPLANT
STAPLER VISISTAT 35W (STAPLE) IMPLANT
STEM FEMORAL SZ8 STD ACTIS (Stem) ×2 IMPLANT
STRIP CLOSURE SKIN 1/2X4 (GAUZE/BANDAGES/DRESSINGS) ×4 IMPLANT
SUT ETHIBOND NAB CT1 #1 30IN (SUTURE) ×2 IMPLANT
SUT MNCRL AB 4-0 PS2 18 (SUTURE) IMPLANT
SUT VIC AB 0 CT1 27 (SUTURE) ×1
SUT VIC AB 0 CT1 27XBRD ANBCTR (SUTURE) ×1 IMPLANT
SUT VIC AB 1 CT1 27 (SUTURE) ×1
SUT VIC AB 1 CT1 27XBRD ANBCTR (SUTURE) ×1 IMPLANT
SUT VIC AB 2-0 CT1 27 (SUTURE) ×2
SUT VIC AB 2-0 CT1 TAPERPNT 27 (SUTURE) ×2 IMPLANT
TOWEL OR 17X24 6PK STRL BLUE (TOWEL DISPOSABLE) ×2 IMPLANT
TOWEL OR 17X26 10 PK STRL BLUE (TOWEL DISPOSABLE) ×2 IMPLANT
TRAY CATH 16FR W/PLASTIC CATH (SET/KITS/TRAYS/PACK) IMPLANT
TRAY FOLEY W/BAG SLVR 16FR (SET/KITS/TRAYS/PACK)
TRAY FOLEY W/BAG SLVR 16FR ST (SET/KITS/TRAYS/PACK) IMPLANT
WATER STERILE IRR 1000ML POUR (IV SOLUTION) ×4 IMPLANT

## 2018-08-13 NOTE — Brief Op Note (Signed)
08/13/2018  9:35 AM  PATIENT:  [name]  68 y.o. male  PRE-OPERATIVE DIAGNOSIS:  left hip avascular necrosis with femoral head collapse  POST-OPERATIVE DIAGNOSIS:  left hip avascular necrosis with femoral head collapse  PROCEDURE:  Procedure(s): LEFT TOTAL HIP ARTHROPLASTY ANTERIOR APPROACH (Left)  SURGEON:  Surgeon(s) and Role:    Mcarthur Rossetti, MD - Primary  PHYSICIAN ASSISTANT: Benita Stabile, PA-C assisted  ANESTHESIA:   general  EBL:  150 mL   COUNTS:  YES  DICTATION: .Other Dictation: Dictation Number [id number]  PLAN OF CARE: Admit to inpatient   PATIENT DISPOSITION:  PACU - hemodynamically stable.   Delay start of Pharmacological VTE agent (>24hrs) due to surgical blood loss or risk of bleeding: no

## 2018-08-13 NOTE — Anesthesia Postprocedure Evaluation (Signed)
Anesthesia Post Note  Patient: [name]  Procedure(s) Performed: LEFT TOTAL HIP ARTHROPLASTY ANTERIOR APPROACH (Left Hip)     Patient location during evaluation: PACU Anesthesia Type: General Level of consciousness: awake and alert Pain management: pain level controlled Vital Signs Assessment: post-procedure vital signs reviewed and stable Respiratory status: spontaneous breathing, nonlabored ventilation, respiratory function stable and patient connected to nasal cannula oxygen Cardiovascular status: blood pressure returned to baseline and stable Postop Assessment: no apparent nausea or vomiting Anesthetic complications: no    Last Vitals:  Vitals:   08/13/18 1037 08/13/18 1102  BP: 124/80 (!) 122/58  Pulse: 90 81  Resp: 13 16  Temp: 36.4 C (!) 36.4 C  SpO2: 100% 91%    Last Pain:  Vitals:   08/13/18 1102  TempSrc: Oral  PainSc:                  Miliano Cotten S

## 2018-08-13 NOTE — Anesthesia Procedure Notes (Signed)
Procedure Name: Intubation Date/Time: 08/13/2018 7:50 AM Performed by: Orlie Dakin, CRNA Pre-anesthesia Checklist: Patient identified, Emergency Drugs available, Suction available and Patient being monitored Patient Re-evaluated:Patient Re-evaluated prior to induction Oxygen Delivery Method: Circle system utilized Preoxygenation: Pre-oxygenation with 100% oxygen Induction Type: IV induction and Rapid sequence Laryngoscope Size: Miller and 3 Grade View: Grade II Tube type: Oral Tube size: 7.5 mm Number of attempts: 1 Airway Equipment and Method: Stylet Placement Confirmation: ETT inserted through vocal cords under direct vision,  positive ETCO2 and breath sounds checked- equal and bilateral Secured at: 23 cm Tube secured with: Tape Dental Injury: Teeth and Oropharynx as per pre-operative assessment  Comments: RSI due to concerns of Covid-19 pandemic.  4x4s bite block used.

## 2018-08-13 NOTE — Care Plan (Signed)
Ortho Bundle Case Management Note  Patient Details  Name: [name] MRN: [id number] Date of Birth: 1950-06-10  Patient has all DME at home. Daughter will be assisting at discharge. Anticipate home health PT only.                    HH Arranged:  PT HH Agency:  [employer]  Additional Comments: Please contact me with any questions of if this plan should need to change.  Jamse Arn, RN, BSN, [employer]  [phone] 08/13/2018, 11:00 AM

## 2018-08-13 NOTE — Op Note (Signed)
NAME: Deblois, Hung L. MEDICAL RECORD VO:[id number] ACCOUNT [id number] DATE OF BIRTH:1950/10/06 FACILITY: [facility] LOCATION: [facility]-PER[facility] PHYSICIAN:Almedia Cordell Kerry Fort, MD  OPERATIVE REPORT  DATE OF PROCEDURE:  08/13/2018  PREOPERATIVE DIAGNOSIS:  Left hip end-stage avascular necrosis with acute femoral head collapse.  POSTOPERATIVE DIAGNOSIS:  Left hip end-stage avascular necrosis with acute femoral head collapse.  PROCEDURE:  Left total hip arthroplasty through direct anterior approach.  IMPLANTS:  DePuy Sector Gription acetabular component size 54, size 36+4 neutral polyethylene liner, size 8 Actis femoral component with standard offset, size 36+5 metal hip ball.  SURGEON:  Lind Guest. Ninfa Linden, MD  ASSISTANT:  Erskine Emery, PA-C  ANESTHESIA:  General.  ANTIBIOTICS:  900 mg IV clindamycin.  ESTIMATED BLOOD LOSS:  474 mL  COMPLICATIONS:  None.  INDICATIONS:  The patient is a 68 year old gentleman with a long history of CLL as well as a history of being HIV positive for over 20 years with an undetectable viral load and normal CD4 count.  He has been dealing with an acute onset of left hip pain  for just a few months now that has gotten dramatically worse in the last week.  An MRI was obtained of his hip that as well as plain films and it showed significant edema in the left femoral head and neck with subchondral fracture of the femoral head.   The x-rays show flattening of the femoral head and evidence of an acute collapse.  He does have a nidus of avascular necrosis in his right hip that is asymptomatic and it seems like it is decently walled off.  He does have a spinal fusion and walks with  a significant kyphosis leaned way over.  One of our partners had me assume care of him because of one the need for an acute hip replacement given the femoral head collapse and two, positioning of the implants as to maximize stability based on his gait  and his kyphosis and back fusion.  I  had a long and thorough discussion with the patient about the surgery.  We talked about the risk of acute blood loss anemia, nerve or vessel injury, fracture, infection, dislocation, DVT and implant failure.  We  talked about our goals being decreased pain, improved mobility and overall improved quality of life.  Given the coronavirus pandemic, I did run this case by my colleague, Dr. Wynelle Link, who is a joint specialist who does give clearance for this surgery  being more of an urgent need.  DESCRIPTION OF PROCEDURE:  After informed consent was obtained and appropriate left hip was marked, he was brought to the operating room and general anesthesia was obtained while he was on a stretcher.  I assessed his leg length and he is definitely  shorter on his left operative side.  Traction boots were placed on both his feet.  Next, he was placed supine on the Hana fracture table with the perineal post in place and both legs in line skeletal traction device and no traction applied.  His left  operative hip was prepped and draped with DuraPrep and sterile drapes.  A time-out was called to identify correct patient, correct left hip.  We then made an incision just inferior and posterior to the anterior superior iliac spine and carried this  obliquely down the leg.  We dissected down tensor fascia lata muscle.  Tensor fascia was then divided longitudinally to proceed with direct anterior approach to the hip.  We identified and cauterized circumflex vessels.  I then identified the  hip  capsule, opened the hip capsule in an L-type format finding a very large joint effusion and significant fragmentation of cartilage floating around in his hip.  I made our femoral neck cut with an oscillating saw just proximal to the lesser trochanter and  completed this with an osteotome and removed the femoral head in its entirety and found a wide area of cartilage flaking off and an obvious subchondral fracture.  We passed the femoral  head off to the back table.  We then cleaned the acetabulum of  remnants of the acetabular labrum and other debris.  I placed a bent Hohmann over the medial acetabular rim and then began reaming under direct visualization from a size 43 reamer in stepwise increments going up to a size 53 with all reamers under direct  visualization, the last reamer under direct fluoroscopy, so I could obtain my depth of reaming by inclination and anteversion.  I then placed the real DePuy Sector Gription acetabular component size 54 and a 36+4 polyethylene liner for that size  acetabular component.  Attention was then turned to the femur.  With the leg externally rotated to 120 degrees, extended and adducted, I was able to place a Mueller retractor medially and Hohmann retractor around the greater trochanter, released lateral  joint capsule and used a box-cutting osteotome to enter femoral canal and a rongeur to lateralize.  I then began broaching using the [place] broaching system starting with a starting broach in a zero in stepwise increments to an 8.  With the 8 in place,  we trialed a standard offset femoral neck and a 36+1.5 hip ball.  We brought the leg back over and up and with traction and internal rotation, reducing the pelvis and I felt like I needed just a little bit more leg length and offset.  I wanted to  actually make him a little bit longer if I could due to his dislocation risk given his kyphosis and spine issues.  Of note, I did feel that I placed the acetabular component in appropriate anteversion and inclination as well.  I then dislocated the hip  and removed the trial components.  I placed the real Actis femoral component with standard offset size 8 and we went with a 36+5 metal hip ball, reduced this in the acetabulum and I was very pleased with his offset and stability he had created and his  leg length knowing that I had lengthened him.  We then irrigated the soft tissue with normal saline solution  using pulsatile lavage.  There was no joint capsule to close, so we closed the tensor fascia with a running #1 Vicryl suture followed by 0 Vicryl  in the deep tissue, 2-0 Vicryl was used to close the subcutaneous tissue and interrupted staples were used to close the skin.  Xeroform and Aquacel dressing was applied.  He was taken off the Hana table, awakened, extubated, and taken to recovery room  in stable condition.  All final counts were correct.  There were no complications noted.  Of note, Benita Stabile, PA-C, assisted in the entire case.  His assistance was crucial for facilitating all aspects of this case.  TN/NUANCE  D:08/13/2018 T:08/13/2018 JOB:[id number]

## 2018-08-13 NOTE — Evaluation (Signed)
Physical Therapy Evaluation Patient Details Name: [name] MRN: [id number] DOB: 15-Feb-1951 Today's Date: 08/13/2018   History of Present Illness  68 y.o. male, has a history of pain and functional disability in the left hip(s) due to avascular necrosis with femoral head collapse. s/p Left total hip arthroplasty through direct anterior approach 08/13/2018 PMH: CAD, RA, recurrent DVT    Clinical Impression  Pt is s/p THA resulting in the deficits listed below (see PT Problem List). Pt is limited in safe mobility by L LE pain and decreased strength and endurance. Pt requires min A for bed mobility, transfers and ambulation of 5 feet with RW. Pt will benefit from skilled PT to increase their independence and safety with mobility to allow discharge to the venue listed below.      Follow Up Recommendations Follow surgeon's recommendation for DC plan and follow-up therapies    Equipment Recommendations  None recommended by PT    Recommendations for Other Services       Precautions / Restrictions Restrictions Weight Bearing Restrictions: Yes LLE Weight Bearing: Weight bearing as tolerated      Mobility  Bed Mobility Overal bed mobility: Needs Assistance Bed Mobility: Rolling;Sidelying to Sit Rolling: Min guard Sidelying to sit: Min assist       General bed mobility comments: min guard for rolling vc for not lying on side too long before pushing up   Transfers Overall transfer level: Needs assistance Equipment used: Rolling walker (2 wheeled) Transfers: Sit to/from Stand Sit to Stand: Min assist         General transfer comment: minA for power up and steadying at [employer]  Ambulation/Gait Ambulation/Gait assistance: Min assist Gait Distance (Feet): 5 Feet Assistive device: Rolling walker (2 wheeled) Gait Pattern/deviations: Step-through pattern;Antalgic;Decreased weight shift to left;Decreased stance time - left;Decreased step length - right Gait velocity: slowed Gait  velocity interpretation: <1.31 ft/sec, indicative of household ambulator General Gait Details: minA for steadying with RW, vc for proximity to RW, and upright posture, pt reports unable to achieve upright due to prior back surgery        Balance Overall balance assessment: Needs assistance Sitting-balance support: Feet supported;No upper extremity supported Sitting balance-[name]: Fair     Standing balance support: Bilateral upper extremity supported Standing balance-[name]: Poor                               Pertinent Vitals/Pain Pain Assessment: 0-10 Pain Score: 5  Pain Location: L hip  Pain Descriptors / Indicators: Grimacing;Guarding;Burning Pain Intervention(s): Limited activity within patient's tolerance;Monitored during session;Repositioned    Home Living Family/patient expects to be discharged to:: Private residence Living Arrangements: Alone Available Help at Discharge: Family;Available 24 hours/day Type of Home: House Home Access: Stairs to enter Entrance Stairs-Rails: Right Entrance Stairs-Number of Steps: 4 Home Layout: One level Home Equipment: Walker - 2 wheels;Shower seat      Prior Function Level of Independence: Independent with assistive device(s)         Comments: used cane for ambulation      Hand Dominance        Extremity/Trunk Assessment   Upper Extremity Assessment Upper Extremity Assessment: Generalized weakness    Lower Extremity Assessment Lower Extremity Assessment: LLE deficits/detail LLE Deficits / Details: L hip and knee ROM limited by surgical pain, ankle WFL LLE: Unable to fully assess due to pain    Cervical / Trunk Assessment Cervical / Trunk  Assessment: Kyphotic  Communication   Communication: No difficulties  Cognition Arousal/Alertness: Awake/alert Behavior During Therapy: WFL for tasks assessed/performed Overall Cognitive Status: Within Functional Limits for tasks assessed                                         General Comments General comments (skin integrity, edema, etc.): Dressing clean and intact        Assessment/Plan    PT Assessment Patient needs continued PT services  PT Problem List Decreased strength;Decreased range of motion;Decreased activity tolerance;Decreased balance;Decreased mobility;Decreased coordination;Pain       PT Treatment Interventions DME instruction;Gait training;Stair training;Functional mobility training;Therapeutic activities;Therapeutic exercise;Balance training;Patient/family education;Cognitive remediation    PT Goals (Current goals can be found in the Care Plan section)  Acute Rehab PT Goals Patient Stated Goal: have less pain with walking PT Goal Formulation: With patient Time For Goal Achievement: 08/27/18 Potential to Achieve Goals: Fair    Frequency Min 5X/week    AM-PAC PT "6 Clicks" Mobility  Outcome Measure Help needed turning from your back to your side while in a flat bed without using bedrails?: A Little Help needed moving from lying on your back to sitting on the side of a flat bed without using bedrails?: A Little Help needed moving to and from a bed to a chair (including a wheelchair)?: A Little Help needed standing up from a chair using your arms (e.g., wheelchair or bedside chair)?: A Little Help needed to walk in hospital room?: A Little Help needed climbing 3-5 steps with a railing? : A Lot 6 Click Score: 17    End of Session Equipment Utilized During Treatment: Gait belt Activity Tolerance: Patient limited by pain Patient left: in chair;with call bell/phone within reach;with chair alarm set Nurse Communication: Mobility status;Precautions;Weight bearing status PT Visit Diagnosis: Unsteadiness on feet (R26.81);Other abnormalities of gait and mobility (R26.89);Muscle weakness (generalized) (M62.81);Difficulty in walking, not elsewhere classified (R26.2);Pain Pain - Right/Left: Left Pain -  part of body: Hip    Time: 1458-1520 PT Time Calculation (min) (ACUTE ONLY): 22 min   Charges:   PT Evaluation $PT Eval Low Complexity: 1 Low          Elishah Ashmore B. Migdalia Dk PT, DPT Acute Rehabilitation Services Pager [phone] Office [phone]   [place] 08/13/2018, 3:58 PM

## 2018-08-13 NOTE — Transfer of Care (Signed)
Immediate Anesthesia Transfer of Care Note  Patient: [name]  Procedure(s) Performed: LEFT TOTAL HIP ARTHROPLASTY ANTERIOR APPROACH (Left Hip)  Patient Location: PACU  Anesthesia Type:General  Level of Consciousness: awake and patient cooperative  Airway & Oxygen Therapy: Patient Spontanous Breathing and Patient connected to face mask oxygen  Post-op Assessment: Report given to RN, Post -op Vital signs reviewed and stable and Patient moving all extremities X 4  Post vital signs: Reviewed and stable  Last Vitals:  Vitals Value Taken Time  BP 163/86 08/13/2018  9:51 AM  Temp    Pulse 92 08/13/2018  9:52 AM  Resp 8 08/13/2018  9:54 AM  SpO2 94 % 08/13/2018  9:52 AM  Vitals shown include unvalidated device data.  Last Pain: There were no vitals filed for this visit.       Complications: No apparent anesthesia complications

## 2018-08-13 NOTE — Progress Notes (Signed)
2Orthopedic Tech Progress Note Patient Details:  [name] 1951-01-11 [id number] I applied over head frame with trapeze Patient ID: [name], male   DOB: 17-Jun-1950, 68 y.o.   MRN: [id number]   [name] 08/13/2018, 11:16 AM

## 2018-08-13 NOTE — H&P (Signed)
TOTAL HIP ADMISSION H&P  Patient is admitted for left total hip arthroplasty.  Subjective:  Chief Complaint: left hip pain  HPI: [name], 68 y.o. male, has a history of pain and functional disability in the left hip(s) due to avascular necrosis with femoral head collapse and patient has failed non-surgical conservative treatments for greater than 12 weeks to include corticosteriod injections, use of assistive devices and activity modification.  Onset of symptoms was abrupt starting 4-5 months ago with gradually worsening course since that time.The patient noted no past surgery on the left hip(s).  Patient currently rates pain in the left hip at 10 out of 10 with activity. Patient has night pain, worsening of pain with activity and weight bearing, trendelenberg gait, pain that interfers with activities of daily living and pain with passive range of motion. Patient has evidence of subchondral cysts and edema in the femoral head and neck with femoral head subchondral fracture by imaging studies. This condition presents safety issues increasing the risk of falls.  There is no current active infection.  Patient Active Problem List   Diagnosis Date Noted  . Avascular necrosis of bone of hip, left ([place]) 08/09/2018  . Eczema 07/01/2018  . Candida rash of groin 07/01/2018  . Tinea corporis 01/22/2018  . Depression with anxiety 01/14/2018  . 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 Carotid artery disease ([place]) 11/13/2016  . Stenosis of carotid artery 11/13/2016  . Spinal stenosis of thoracolumbar region 07/02/2016  . ILD (interstitial lung disease) ([place]) 09/13/2015  . Immunocompromised state ([place]) 09/13/2015  . PCP (pneumocystis jiroveci pneumonia) ([place]) 06/18/2015  . Postinflammatory pulmonary fibrosis ([place]) 05/11/2015  . GERD (gastroesophageal reflux disease)   . Hereditary and idiopathic peripheral neuropathy 09/08/2013  . Erosive esophagitis 12/28/2012  . Allergic rhinitis, cause  unspecified 04/28/2012  . Long term current use of anticoagulant therapy 02/03/2012  . Hypertension   . DVT, lower extremity, recurrent ([place])   . Gout   . Carotid artery occlusion   . Hyperlipidemia with target LDL less than 100   . HIV infection ([place]) 04/08/2011  . Arthritis, rheumatoid ([place]) 04/08/2011  . Chronic lymphoblastic leukemia 04/08/2011  . Impotence of organic origin 04/02/2011  . CAD (coronary artery disease) of artery bypass graft 02/14/2009  . Herpes genitalis 06/22/2008   Past Medical History:  Diagnosis Date  . Allergy   . Anxiety   . Carotid artery occlusion    40-60% right ICA stenosis (09/2008)  . Cataract   . Chronic back pain   . CLL (chronic lymphoblastic leukemia) dx 2010   Followed at [facility] q61mo, no current therapy   . Clotting disorder ([place])   . Coronary artery disease 2010   s/p CABG '10, sees Dr. Percival Spanish  . Depression   . Diverticulosis   . DVT, lower extremity, recurrent ([place]) 2008, 2009   LLE, chronic anticoag since 2009  . Esophagitis   . Fibromyalgia   . Gallstones   . GERD (gastroesophageal reflux disease)   . Gout   . Gynecomastia, male   . H/O hiatal hernia 2008   surgery  . Hemorrhoids   . Hepatitis A yrs ago  . HIV infection ([place]) dx 1993  . Hypertension   . Impotence of organic origin   . Myocardial infarction ([place]) 2010    x 2  . Neuromuscular disorder ([facility])    neuropathy  . Osteoarthritis, knee    s/p B TKA  . Osteoporosis   . Pneumonia mrach, may, july 2017  .  Rheumatoid arthritis(714.0) dx 2010   MTX, follows with rheum  . Seasonal allergies   . Secondary syphilis 07/24/14 dx   s/p 2 wks doxy  . Status post dilation of esophageal narrowing   . Stroke ([place]) [place]   . TIA (transient ischemic attack) 1997   mild residual L mouth droop  . Tubular adenoma of colon     Past Surgical History:  Procedure Laterality Date  . CHOLECYSTECTOMY    . COLONOSCOPY WITH PROPOFOL N/A 12/28/2012   Procedure: COLONOSCOPY WITH  PROPOFOL;  Surgeon: Jerene Bears, MD;  Location: [facility];  Service: Gastroenterology;  Laterality: N/A;  . CORONARY ARTERY BYPASS GRAFT  2010   triple bypass  . ESOPHAGOGASTRODUODENOSCOPY (EGD) WITH PROPOFOL N/A 12/28/2012   Procedure: ESOPHAGOGASTRODUODENOSCOPY (EGD) WITH PROPOFOL;  Surgeon: Jerene Bears, MD;  Location: [facility];  Service: Gastroenterology;  Laterality: N/A;  . ESOPHAGOGASTRODUODENOSCOPY (EGD) WITH PROPOFOL N/A 03/15/2013   Procedure: ESOPHAGOGASTRODUODENOSCOPY (EGD) WITH PROPOFOL;  Surgeon: Jerene Bears, MD;  Location: [facility];  Service: Gastroenterology;  Laterality: N/A;  . ESOPHAGOGASTRODUODENOSCOPY (EGD) WITH PROPOFOL N/A 02/07/2016   Procedure: ESOPHAGOGASTRODUODENOSCOPY (EGD) WITH PROPOFOL;  Surgeon: Jerene Bears, MD;  Location: [facility];  Service: Gastroenterology;  Laterality: N/A;  . HARDWARE REMOVAL N/A 07/02/2012   Procedure: HARDWARE REMOVAL;  Surgeon: Elaina Hoops, MD;  Location: [facility];  Service: Neurosurgery;  Laterality: N/A;  . HIATAL HERNIA REPAIR     wrap  . INGUINAL HERNIA REPAIR Bilateral   . JOINT REPLACEMENT Left 1999  . KNEE ARTHROPLASTY  07/22/2011   Procedure: COMPUTER ASSISTED TOTAL KNEE ARTHROPLASTY;  Surgeon: Meredith Pel, MD;  Location: [facility];  Service: Orthopedics;  Laterality: Right;  Right total knee arthroplasty  . MANDIBLE SURGERY Bilateral    tmj  . REPLACEMENT TOTAL KNEE Bilateral   . ring around testicle hernia reapir  184 and 1986   x 2  . ROTATOR CUFF REPAIR Right   . SHOULDER SURGERY Left   . SPINE SURGERY  2010   "rod and screws", "failed", lopwer spine,   . stent to heart x 1  2010  . TONSILLECTOMY    . UMBILICAL HERNIA REPAIR     x 1  . varicose vein     stripping  . VIDEO BRONCHOSCOPY Bilateral 10/16/2015   Procedure: VIDEO BRONCHOSCOPY WITHOUT FLUORO;  Surgeon: Brand Males, MD;  Location: [facility];  Service: Cardiopulmonary;  Laterality: Bilateral;    Current Facility-Administered  Medications  Medication Dose Route Frequency Provider Last Rate Last Dose  . chlorhexidine (HIBICLENS) 4 % liquid 4 application  60 mL Topical Once Erskine Emery W, PA-C      . clindamycin (CLEOCIN) 900 MG/50ML IVPB           . clindamycin (CLEOCIN) IVPB 900 mg  900 mg Intravenous On Call to OR Mcarthur Rossetti, MD       Facility-Administered Medications Ordered in Other Encounters  Medication Dose Route Frequency Provider Last Rate Last Dose  . lactated ringers infusion   Intravenous Continuous PRN Orlie Dakin, CRNA       Allergies  Allergen Reactions  . Golimumab Anaphylaxis    simponi aria  . Orencia [Abatacept] Anaphylaxis  . Other Anaphylaxis and Hives    Pecan  . Peanut-Containing Drug Products Anaphylaxis, Hives and Swelling    Swelling of throat  . Morphine Other (See Comments)    REACTION: severe headache  . Oxycodone-Acetaminophen Other (See Comments)  REACTION: headache  . Penicillins Rash and Other (See Comments)    FLUSHED, RED  Has patient had a PCN reaction causing immediate rash, facial/tongue/throat swelling, SOB or lightheadedness with hypotension: #  #  #  YES  #  #  #  Has patient had a PCN reaction causing severe rash involving mucus membranes or skin necrosis: No Has patient had a PCN reaction that required hospitalization No Has patient had a PCN reaction occurring within the last 10 years: No If all of the above answers are "NO", then may proceed with Cephalosporin use  . Promethazine Hcl Other (See Comments)    REACTION: makes him feel drunk at higher strengths    Social History   Tobacco Use  . Smoking status: Never Smoker  . Smokeless tobacco: Never Used  . Tobacco comment: occ wine  Substance Use Topics  . Alcohol use: Yes    Comment: occasional wine- 1-2 per week    Family History  Problem Relation Age of Onset  . Breast cancer Mother   . Hypertension Mother   . Hyperlipidemia Mother   . Diabetes Mother   . Prostate cancer  Father   . Colon polyps Father   . Hyperlipidemia Father   . Crohn's disease Paternal Aunt   . Diabetes Maternal Grandmother   . Diabetes Brother        x 3  . Heart attack Brother   . Heart disease Brother        x 3  . Heart attack Brother   . Hyperlipidemia Brother        x 3  . Alcohol abuse Daughter   . Drug abuse Daughter   . Asthma Brother   . CVA Brother   . Colon cancer Neg Hx   . Esophageal cancer Neg Hx   . Rectal cancer Neg Hx   . Stomach cancer Neg Hx      Review of Systems  All other systems reviewed and are negative.   Objective:  Physical Exam  Constitutional: He is oriented to person, place, and time. He appears well-developed and well-nourished.  HENT:  Head: Normocephalic and atraumatic.  Eyes: Pupils are equal, round, and reactive to light. EOM are normal.  Neck: Normal range of motion. Neck supple.  Cardiovascular: Normal rate.  Respiratory: Effort normal.  GI: Soft.  Musculoskeletal:     Left hip: He exhibits decreased range of motion, decreased strength, tenderness and bony tenderness.  Neurological: He is alert and oriented to person, place, and time.  Skin: Skin is warm and dry.  Psychiatric: He has a normal mood and affect.    Vital signs in last 24 hours: Temp:  [97.1 F (36.2 C)] 97.1 F (36.2 C) (04/03 0550) Pulse Rate:  [74] 74 (04/03 0554) Resp:  [18] 18 (04/03 0554) BP: (136)/(76) 136/76 (04/03 0554) SpO2:  [98 %] 98 % (04/03 0554) Weight:  [88.5 kg] 88.5 kg (04/03 0554)  Labs:   Estimated body mass index is 28.8 kg/m as calculated from the following:   Height as of this encounter: 5\' 9"  (1.753 m).   Weight as of this encounter: 88.5 kg.   Imaging Review Plain radiographs demonstrate severe avascular necrosis of the left hip with a subchondral fracture. The bone quality appears to be good for age and reported activity level.      Assessment/Plan:  Avascular necrosis of the left hip with femoral head collapse,  left hip(s)  The patient history, physical examination, clinical judgement  of the provider and imaging studies are consistent with end stage AVN of the left hip(s) and total hip arthroplasty is deemed medically necessary. The treatment options including medical management, injection therapy, arthroscopy and arthroplasty were discussed at length. The risks and benefits of total hip arthroplasty were presented and reviewed. The risks due to aseptic loosening, infection, stiffness, dislocation/subluxation,  thromboembolic complications and other imponderables were discussed.  The patient acknowledged the explanation, agreed to proceed with the plan and consent was signed. Patient is being admitted for inpatient treatment for surgery, pain control, PT, OT, prophylactic antibiotics, VTE prophylaxis, progressive ambulation and ADL's and discharge planning.The patient is planning to be discharged home with home health services   Anticipated LOS equal to or greater than 2 midnights due to - Age 25 and older with one or more of the following:  - Obesity  - Expected need for hospital services (PT, OT, Nursing) required for safe  discharge  - Anticipated need for postoperative skilled nursing care or inpatient rehab  - Active co-morbidities: CLL OR   - Unanticipated findings during/Post Surgery: None  - Patient is a high risk of re-admission due to: None

## 2018-08-14 ENCOUNTER — Other Ambulatory Visit: Payer: Self-pay

## 2018-08-14 LAB — CBC
HCT: 31.2 % — ABNORMAL LOW (ref 39.0–52.0)
Hemoglobin: 10 g/dL — ABNORMAL LOW (ref 13.0–17.0)
MCH: 34.6 pg — ABNORMAL HIGH (ref 26.0–34.0)
MCHC: 32.1 g/dL (ref 30.0–36.0)
MCV: 108 fL — ABNORMAL HIGH (ref 80.0–100.0)
Platelets: 109 10*3/uL — ABNORMAL LOW (ref 150–400)
RBC: 2.89 MIL/uL — ABNORMAL LOW (ref 4.22–5.81)
RDW: 13 % (ref 11.5–15.5)
WBC: 10.9 10*3/uL — ABNORMAL HIGH (ref 4.0–10.5)
nRBC: 0 % (ref 0.0–0.2)

## 2018-08-14 LAB — BASIC METABOLIC PANEL
Anion gap: 6 (ref 5–15)
BUN: 12 mg/dL (ref 8–23)
CO2: 28 mmol/L (ref 22–32)
Calcium: 7.5 mg/dL — ABNORMAL LOW (ref 8.9–10.3)
Chloride: 100 mmol/L (ref 98–111)
Creatinine, Ser: 0.88 mg/dL (ref 0.61–1.24)
GFR calc Af Amer: >60 mL/min (ref >60)
GFR calc non Af Amer: >60 mL/min (ref >60)
Glucose, Bld: 144 mg/dL — ABNORMAL HIGH (ref 70–99)
Potassium: 3.9 mmol/L (ref 3.5–5.1)
Sodium: 134 mmol/L — ABNORMAL LOW (ref 135–145)

## 2018-08-14 MED ORDER — POVIDONE-IODINE 10 % EX SWAB: 2.0000 | Freq: Once | CUTANEOUS | Status: DC

## 2018-08-14 MED ORDER — OXYCODONE HCL 5 MG PO TABS: 5.0000 mg | ORAL_TABLET | Freq: Four times a day (QID) | ORAL | 0 refills | Status: DC | PRN

## 2018-08-14 MED ORDER — TRANEXAMIC ACID-NACL 1000-0.7 MG/100ML-% IV SOLN
1000.0000 mg | INTRAVENOUS | Status: DC
  Filled 2018-08-14: qty 100

## 2018-08-14 NOTE — Progress Notes (Signed)
Patient ID: [name], male   DOB: Nov 18, 1950, 67 y.o.   MRN: [id number] Working well with therapy.  Feels better overall.  Vitals are stable.  He does have acute blood loss anemia from his surgery and his chronic blood thinning meds.  His dressing had bloody drainage, so I did change this.  Therapy is working with him.  Anticipate discharge to home tomorrow.

## 2018-08-14 NOTE — Progress Notes (Signed)
Patient ID: [name], male   DOB: 1950-09-26, 68 y.o.   MRN: [id number] Patient is postoperative day 1 total hip arthroplasty.  Patient denies any GI symptoms he states he is on a clear diet and would like to change to a regular diet.  He has been up ambulating with therapy.  We will plan for discharge to home on Sunday.

## 2018-08-14 NOTE — Progress Notes (Signed)
Occupational Therapy Evaluation and Discharge  Patient Details Name: [name] MRN: [id number] DOB: [birth year] Today's Date: 08/14/2018    History of Present Illness 68 y.o. male, has a history of pain and functional disability in the left hip(s) due to avascular necrosis with femoral head collapse. s/p Left total hip arthroplasty through direct anterior approach 08/13/2018 PMH: CAD, RA, recurrent DVT     Clinical Impression   Pt s/p L THA.  He is able to complete functional mobility and ADLs at supervision level. Pt completed bathing,grooming and dressing ADLs during evaluation.  Therapist completed education on DME recommendation and ADL techniques. Pt anticipates discharge home tomorrow and will have assist from his daughter.  No further acute OT services needed at this time. Will sign off.     Follow Up Recommendations  No OT follow up;Supervision/Assistance - 24 hour    Equipment Recommendations       Recommendations for Other Services       Precautions / Restrictions Restrictions Weight Bearing Restrictions: Yes LLE Weight Bearing: Weight bearing as tolerated      Mobility Bed Mobility Overal bed mobility: (pt sitting EOB at OT arrival)                Transfers Overall transfer level: Needs assistance Equipment used: Rolling walker (2 wheeled) Transfers: Sit to/from Stand Sit to Stand: Supervision         General transfer comment: Close supervision for safety.  Pt independently demonstrating safe hand placement.     Balance                                           ADL either performed or assessed with clinical judgement   ADL Overall ADL's : Needs assistance/impaired Eating/Feeding: Independent;Sitting   Grooming: Wash/dry face;Wash/dry hands;Oral care;Brushing hair;Applying deodorant;Standing;Supervision/safety   Upper Body Bathing: Set up;Sitting   Lower Body Bathing: Supervison/ safety;Sit to/from stand   Upper Body  Dressing : Set up;Sitting   Lower Body Dressing: Supervision/safety;Sit to/from stand   Toilet Transfer: Supervision/safety;RW;BSC   Toileting- Water quality scientist and Hygiene: Supervision/safety;Sit to/from stand       Functional mobility during ADLs: Supervision/safety;Rolling walker General ADL Comments: Pt performed grooming and bathing ADLs at sink. Stood at sink for grooming. Performed UB bathing and bathing LEs in sitting and completed back peri care/hygiene in sit<>stand.      Vision Baseline Vision/History: Wears glasses Wears Glasses: Reading only Patient Visual Report: No change from baseline       Perception     Praxis      Pertinent Vitals/Pain Pain Assessment: 0-10 Pain Score: 3  Pain Location: L hip  Pain Descriptors / Indicators: Burning;Guarding Pain Intervention(s): Limited activity within patient's tolerance;Repositioned;Monitored during session     Hand Dominance     Extremity/Trunk Assessment Upper Extremity Assessment Upper Extremity Assessment: Overall WFL for tasks assessed;Generalized weakness   Lower Extremity Assessment Lower Extremity Assessment: Defer to PT evaluation   Cervical / Trunk Assessment Cervical / Trunk Assessment: Kyphotic   Communication Communication Communication: No difficulties   Cognition Arousal/Alertness: Awake/alert Behavior During Therapy: WFL for tasks assessed/performed Overall Cognitive Status: Within Functional Limits for tasks assessed                                     General  Comments       Exercises     Shoulder Instructions      Home Living Family/patient expects to be discharged to:: Private residence Living Arrangements: Alone Available Help at Discharge: Family;Available 24 hours/day Type of Home: House Home Access: Stairs to enter [employer] of Steps: 4 Entrance Stairs-Rails: Right Home Layout: One level     Bathroom Shower/Tub: Medical illustrator: Standard Bathroom Accessibility: Yes How Accessible: Accessible via walker Home Equipment: Grab bars - toilet;Walker - 2 wheels;Shower seat   Additional Comments: Daughter is going to come stay with him while he recovers.      Prior Functioning/Environment Level of Independence: Independent with assistive device(s)        Comments: used cane for ambulation         OT Problem List: Decreased strength;Decreased activity tolerance;Impaired balance (sitting and/or standing);Pain      OT Treatment/Interventions: Self-care/ADL training;DME and/or AE instruction;Patient/family education;Therapeutic activities    OT Goals(Current goals can be found in the care plan section) Acute Rehab OT Goals Patient Stated Goal: have less pain with walking OT Goal Formulation: All assessment and education complete, DC therapy  OT Frequency:     Barriers to D/C:            Co-evaluation              AM-PAC OT "6 Clicks" Daily Activity     Outcome Measure Help from another person eating meals?: None Help from another person taking care of personal grooming?: None Help from another person toileting, which includes using toliet, bedpan, or urinal?: None Help from another person bathing (including washing, rinsing, drying)?: A Little Help from another person to put on and taking off regular upper body clothing?: None Help from another person to put on and taking off regular lower body clothing?: A Little 6 Click Score: 22   End of Session Equipment Utilized During Treatment: Rolling walker Nurse Communication: Mobility status  Activity Tolerance: Patient tolerated treatment well Patient left: in chair;with call bell/phone within reach  OT Visit Diagnosis: Unsteadiness on feet (R26.81);Pain Pain - Right/Left: Left Pain - part of body: Hip                Time: 0820-0911 OT Time Calculation (min): 51 min Charges:  OT General Charges $OT Visit: 1 Visit OT  Evaluation $OT Eval Low Complexity: 1 Low OT Treatments $Self Care/Home Management : 8-22 mins $Therapeutic Activity: 8-22 mins    Darrol Jump OTR/L  [place] [phone] 08/14/2018, 9:28 AM

## 2018-08-14 NOTE — Progress Notes (Signed)
Physical Therapy Treatment Patient Details Name: [name] MRN: [id number] DOB: 1951-01-26 Today's Date: 08/14/2018    History of Present Illness 68 y.o. male, has a history of pain and functional disability in the left hip(s) due to avascular necrosis with femoral head collapse. s/p Left total hip arthroplasty through direct anterior approach 08/13/2018 PMH: CAD, RA, recurrent DVT ; multiple back surgeries    PT Comments    Patient was able to safely go up and down 4 steps with minimal cuing. The patient then increased his walking distance to 50'. He walks with a flexed posture but he reports it is better then baseline 2nd to multiple back surgeries. He would benefit from further skilled therapy while in the hospital.    Follow Up Recommendations  Follow surgeon's recommendation for DC plan and follow-up therapies     Equipment Recommendations  None recommended by PT    Recommendations for Other Services       Precautions / Restrictions Restrictions Weight Bearing Restrictions: Yes LLE Weight Bearing: Weight bearing as tolerated    Mobility  Bed Mobility Overal bed mobility: Needs Assistance Bed Mobility: Rolling;Sidelying to Sit   Sidelying to sit: Supervision       General bed mobility comments: Patient was trying to get out of the bed when therapy arrived. He was having some difficulty. Therapy got his bed adjusted and gave him some cuing and he was abel to get out of the bed without physical assistance.   Transfers Overall transfer level: Needs assistance Equipment used: Rolling walker (2 wheeled) Transfers: Sit to/from Stand Sit to Stand: Supervision         General transfer comment: supervision for safety  Ambulation/Gait Ambulation/Gait assistance: Min guard Gait Distance (Feet): 50 Feet Assistive device: Rolling walker (2 wheeled) Gait Pattern/deviations: Step-through pattern;Antalgic;Decreased weight shift to left;Decreased stance time - left;Decreased  step length - right Gait velocity: slowed Gait velocity interpretation: <1.31 ft/sec, indicative of household ambulator General Gait Details: After steps patient able to walk back to his room. He reported his legs were fatigued but significant improvement in distance from last visit. he iwalks with a flexed trunk but he reports it is better then baseline.    Stairs Stairs: Yes Stairs assistance: Min guard Stair Management: One rail Right Number of Stairs: 4 General stair comments: Patient wenrt up and down 1 step 4x without difficulty. He required min/mod cuing but less cuing required as he practiced. He has 4 sptes into his house. He had a minor increase in pain with steps.    Wheelchair Mobility    Modified Rankin (Stroke Patients Only)       Balance Overall balance assessment: Needs assistance Sitting-balance support: Feet supported;No upper extremity supported Sitting balance-[name]: Good     Standing balance support: Bilateral upper extremity supported Standing balance-[name]: Poor                              Cognition Arousal/Alertness: Awake/alert Behavior During Therapy: WFL for tasks assessed/performed Overall Cognitive Status: Within Functional Limits for tasks assessed                                        Exercises      General Comments        Pertinent Vitals/Pain Pain Assessment: 0-10 Pain Score: 3  Pain Location:  L hip  Pain Descriptors / Indicators: Aching Pain Intervention(s): Limited activity within patient's tolerance;Monitored during session;Premedicated before session;Repositioned    Home Living Family/patient expects to be discharged to:: Private residence Living Arrangements: Alone Available Help at Discharge: Family;Available 24 hours/day Type of Home: House Home Access: Stairs to enter Entrance Stairs-Rails: Right Home Layout: One level Home Equipment: Grab bars - toilet;Walker - 2  wheels;Shower seat Additional Comments: Daughter is going to come stay with him while he recovers.    Prior Function Level of Independence: Independent with assistive device(s)      Comments: used cane for ambulation    PT Goals (current goals can now be found in the care plan section) Acute Rehab PT Goals Patient Stated Goal: have less pain with walking PT Goal Formulation: With patient Time For Goal Achievement: 08/27/18 Potential to Achieve Goals: Fair Progress towards PT goals: Progressing toward goals    Frequency    Min 5X/week      PT Plan Current plan remains appropriate    Co-evaluation              AM-PAC PT "6 Clicks" Mobility   Outcome Measure  Help needed turning from your back to your side while in a flat bed without using bedrails?: A Little Help needed moving from lying on your back to sitting on the side of a flat bed without using bedrails?: A Little Help needed moving to and from a bed to a chair (including a wheelchair)?: A Little Help needed standing up from a chair using your arms (e.g., wheelchair or bedside chair)?: A Little Help needed to walk in hospital room?: A Little Help needed climbing 3-5 steps with a railing? : A Lot 6 Click Score: 17    End of Session Equipment Utilized During Treatment: Gait belt Activity Tolerance: Patient limited by pain Patient left: in chair;with call bell/phone within reach;with chair alarm set Nurse Communication: Mobility status;Precautions;Weight bearing status PT Visit Diagnosis: Unsteadiness on feet (R26.81);Other abnormalities of gait and mobility (R26.89);Muscle weakness (generalized) (M62.81);Difficulty in walking, not elsewhere classified (R26.2);Pain Pain - Right/Left: Left Pain - part of body: Hip     Time: 7681-1572 PT Time Calculation (min) (ACUTE ONLY): 24 min  Charges:  $Gait Training: 8-22 mins $Therapeutic Activity: 8-22 mins                       Carney Living PT DPT 08/14/2018,  12:35 PM

## 2018-08-14 NOTE — Plan of Care (Signed)

## 2018-08-15 ENCOUNTER — Inpatient Hospital Stay (HOSPITAL_COMMUNITY): Payer: Medicare Other

## 2018-08-15 ENCOUNTER — Encounter (HOSPITAL_COMMUNITY)
Admission: RE | Disposition: A | Discharge status: Home Health | Payer: Self-pay | Source: Home / Self Care | Attending: Orthopaedic Surgery

## 2018-08-15 ENCOUNTER — Inpatient Hospital Stay (HOSPITAL_COMMUNITY): Payer: Medicare Other | Admitting: Certified Registered Nurse Anesthetist

## 2018-08-15 DIAGNOSIS — M87052 Idiopathic aseptic necrosis of left femur: Secondary | ICD-10-CM

## 2018-08-15 HISTORY — PX: HIP CLOSED REDUCTION: SHX983

## 2018-08-15 SURGERY — CLOSED REDUCTION, HIP
Anesthesia: General | Site: Hip | Laterality: Left

## 2018-08-15 MED ORDER — FENTANYL CITRATE (PF) 250 MCG/5ML IJ SOLN
INTRAMUSCULAR | Status: AC
  Filled 2018-08-15: qty 5

## 2018-08-15 MED ORDER — LACTATED RINGERS IV SOLN
INTRAVENOUS | Status: DC | PRN
  Administered 2018-08-15: 12:00:00 via INTRAVENOUS

## 2018-08-15 MED ORDER — HYDROCODONE-ACETAMINOPHEN 5-325 MG PO TABS
1.0000 | ORAL_TABLET | ORAL | Status: DC | PRN
  Administered 2018-08-15: 2 via ORAL
  Filled 2018-08-15 (×3): qty 2

## 2018-08-15 MED ORDER — ONDANSETRON HCL 4 MG/2ML IJ SOLN
INTRAMUSCULAR | Status: DC | PRN
  Administered 2018-08-15: 4 mg via INTRAVENOUS

## 2018-08-15 MED ORDER — SUCCINYLCHOLINE CHLORIDE 20 MG/ML IJ SOLN
INTRAMUSCULAR | Status: DC | PRN
  Administered 2018-08-15: 100 mg via INTRAVENOUS

## 2018-08-15 MED ORDER — HYDROCODONE-ACETAMINOPHEN 5-325 MG PO TABS: 1.0000 | ORAL_TABLET | ORAL | 0 refills | Status: DC | PRN

## 2018-08-15 MED ORDER — FENTANYL CITRATE (PF) 250 MCG/5ML IJ SOLN
INTRAMUSCULAR | Status: DC | PRN
  Administered 2018-08-15: 50 ug via INTRAVENOUS

## 2018-08-15 MED ORDER — LIDOCAINE 2% (20 MG/ML) 5 ML SYRINGE
INTRAMUSCULAR | Status: DC | PRN
  Administered 2018-08-15: 60 mg via INTRAVENOUS

## 2018-08-15 MED ORDER — PROPOFOL 10 MG/ML IV BOLUS
INTRAVENOUS | Status: DC | PRN
  Administered 2018-08-15: 100 mg via INTRAVENOUS

## 2018-08-15 MED ORDER — PROPOFOL 10 MG/ML IV BOLUS
INTRAVENOUS | Status: AC
  Filled 2018-08-15: qty 20

## 2018-08-15 SURGICAL SUPPLY — 1 items: IMMOBILIZER KNEE 22 (SOFTGOODS) ×2 IMPLANT

## 2018-08-15 NOTE — Progress Notes (Signed)
Called into pt's room stating pt had "popped out" his hip.  Sharyn Lull, RN and this RN went to pt's room.  Upon entering room, pt was sitting in chair and stated that his hip "popped out".  Sharyn Lull, RN assessed pt.  Upon assessing pt, he screamed out in severe pain.  Pt stated that he leaned over and turned his leg while trying to put his shoe on and heard and felt a big pop.  Dr. Sharol Given notified and STAT L hip xray ordered.  Pt given pain medication and awaiting xray.   Will continue to monitor.  Eliezer Bottom [place]

## 2018-08-15 NOTE — Op Note (Signed)
08/15/2018  12:41 PM  PATIENT:  [name]    PRE-OPERATIVE DIAGNOSIS:  hip dislocation, left  POST-OPERATIVE DIAGNOSIS:  Same  PROCEDURE:  CLOSED REDUCTION HIP  SURGEON:  Newt Minion, MD  PHYSICIAN ASSISTANT:None ANESTHESIA:   General  PREOPERATIVE INDICATIONS:  [name] is a  68 y.o. male with a diagnosis of hip dislocation, left who failed conservative measures and elected for surgical management.    The risks benefits and alternatives were discussed with the patient preoperatively including but not limited to the risks of infection, bleeding, nerve injury, cardiopulmonary complications, the need for revision surgery, among others, and the patient was willing to proceed.  OPERATIVE IMPLANTS: None  @ENCIMAGES @  OPERATIVE FINDINGS: Hip reduced easily and had restoration of length and range of motion  OPERATIVE PROCEDURE: Patient was brought the operating room and underwent a general anesthetic.  After adequate levels anesthesia were obtained a timeout was called.  Patient underwent traction longitudinally with internal rotation the hip reduced easily.  After reduction patient had full range of motion of his hip the leg was long there was no external rotation his leg was straight.  Patient was extubated taken the PACU in stable condition   DISCHARGE PLANNING:  Antibiotic duration: No antibiotics  Weightbearing: Bed rest with knee immobilizer  Pain medication: Continue Vicodin  Dressing care/ Wound VAC: Continue current dressing  Ambulatory devices: Not applicable  Discharge to: Hold on discharge.  Follow-up: In the office 1 week post operative.

## 2018-08-15 NOTE — Progress Notes (Signed)
Patient ID: [name], male   DOB: 1950/07/21, 68 y.o.   MRN: [id number] Plan for discharge today.  Patient states the Percocet gave him a headache a prescription has been sent for Vicodin.

## 2018-08-15 NOTE — Discharge Summary (Signed)
Discharge Diagnoses:  Principal Problem:   Avascular necrosis of bone of hip, left (HCC) Active Problems:   Status post total replacement of left hip   Surgeries: Procedure(s): LEFT TOTAL HIP ARTHROPLASTY ANTERIOR APPROACH on 08/13/2018    Consultants:   Discharged Condition: Improved  Hospital Course: [name] is an 68 y.o. male who was admitted 08/13/2018 with a chief complaint of avascular necrosis left hip, with a final diagnosis of left hip avascular necrosis with femoral head collapse.  Patient was brought to the operating room on 08/13/2018 and underwent Procedure(s): LEFT TOTAL HIP ARTHROPLASTY ANTERIOR APPROACH.    Patient was given perioperative antibiotics:  Anti-infectives (From admission, onward)   Start     Dose/Rate Route Frequency Ordered Stop   08/14/18 0800  elvitegravir-cobicistat-emtricitabine-tenofovir (GENVOYA) 150-150-200-10 MG tablet 1 tablet     1 tablet Oral Daily with breakfast 08/13/18 1056     08/13/18 1130  darunavir (PREZISTA) tablet 800 mg    Note to Pharmacy:  TAKE 1 TABLET (800 MG TOTAL) BY MOUTH DAILY.     800 mg Oral Daily 08/13/18 1056     08/13/18 1100  valACYclovir (VALTREX) tablet 500 mg     500 mg Oral Daily 08/13/18 1056     08/13/18 1100  clindamycin (CLEOCIN) IVPB 600 mg     600 mg 100 mL/hr over 30 Minutes Intravenous Every 6 hours 08/13/18 1056 08/13/18 1904   08/13/18 0600  clindamycin (CLEOCIN) IVPB 900 mg     900 mg 100 mL/hr over 30 Minutes Intravenous On call to O.R. 08/13/18 9509 08/13/18 0822   08/13/18 0555  clindamycin (CLEOCIN) 900 MG/50ML IVPB    Note to Pharmacy:  Merryl Hacker   : cabinet override      08/13/18 0555 08/13/18 0752    .  Patient was given sequential compression devices, early ambulation, and aspirin for DVT prophylaxis.  Recent vital signs:  Patient Vitals for the past 24 hrs:  BP Temp Temp src Pulse Resp SpO2  08/15/18 0259 (!) 108/58 98.1 F (36.7 C) Oral 84 - 94 %  08/14/18 1959 114/63 98.2  F (36.8 C) Oral 80 - 96 %  08/14/18 1726 124/69 98 F (36.7 C) Oral 79 15 96 %  08/14/18 1328 112/66 99 F (37.2 C) Oral 80 12 95 %  08/14/18 0951 124/73 97.9 F (36.6 C) Oral 72 18 97 %  .  Recent laboratory studies: Dg Pelvis Portable  Result Date: 08/13/2018 CLINICAL DATA:  Status post left total hip replacement. EXAM: PORTABLE PELVIS 1-2 VIEWS COMPARISON:  Intraoperative radiographs dated 08/13/2018 and MRI dated 06/03/2018 FINDINGS: The components of the left total hip prosthesis appear in excellent position in the AP projection. Postsurgical air in the soft tissues around the left hip. The visualized pelvic bones are normal. Previous lower lumbar fusion. IMPRESSION: Satisfactory appearance of the pelvis in the AP projection after left total hip prosthesis insertion. Electronically Signed   By: Lorriane Shire M.D.   On: 08/13/2018 10:49   Dg C-arm 1-60 Min  Result Date: 08/13/2018 CLINICAL DATA:  68 year old male with elective hip surgery EXAM: OPERATIVE LEFT HIP (WITH PELVIS IF PERFORMED)  VIEWS TECHNIQUE: Fluoroscopic spot image(s) were submitted for interpretation post-operatively. COMPARISON:  08/09/2018 FINDINGS: Limited intraoperative fluoroscopic spot images demonstrates left hip arthroplasty. IMPRESSION: Left hip arthroplasty. Please refer to the dictated operative report for full details of intraoperative findings and procedure. Electronically Signed   By: Corrie Mckusick D.O.   On: 08/13/2018 09:47  Dg Hip Operative Unilat W Or W/o Pelvis Left  Result Date: 08/13/2018 CLINICAL DATA:  68 year old male with elective hip surgery EXAM: OPERATIVE LEFT HIP (WITH PELVIS IF PERFORMED)  VIEWS TECHNIQUE: Fluoroscopic spot image(s) were submitted for interpretation post-operatively. COMPARISON:  08/09/2018 FINDINGS: Limited intraoperative fluoroscopic spot images demonstrates left hip arthroplasty. IMPRESSION: Left hip arthroplasty. Please refer to the dictated operative report for full details  of intraoperative findings and procedure. Electronically Signed   By: Corrie Mckusick D.O.   On: 08/13/2018 09:47    Discharge Medications:   Allergies as of 08/15/2018      Reactions   Golimumab Anaphylaxis   simponi Donalda Ewings   Orencia [abatacept] Anaphylaxis   Other Anaphylaxis, Hives   Pecan   Peanut-containing Drug Products Anaphylaxis, Hives, Swelling   Swelling of throat   Morphine Other (See Comments)   REACTION: severe headache   Oxycodone-acetaminophen Other (See Comments)   REACTION: headache   Penicillins Rash, Other (See Comments)   FLUSHED, RED Has patient had a PCN reaction causing immediate rash, facial/tongue/throat swelling, SOB or lightheadedness with hypotension: #  #  #  YES  #  #  #  Has patient had a PCN reaction causing severe rash involving mucus membranes or skin necrosis: No Has patient had a PCN reaction that required hospitalization No Has patient had a PCN reaction occurring within the last 10 years: No If all of the above answers are "NO", then may proceed with Cephalosporin use   Promethazine Hcl Other (See Comments)   REACTION: makes him feel drunk at higher strengths      Medication List    STOP taking these medications   acetaminophen-codeine 300-30 MG tablet Commonly known as:  TYLENOL #3     TAKE these medications   atorvastatin 10 MG tablet Commonly known as:  LIPITOR Take 1 tablet (10 mg total) by mouth at bedtime.   clindamycin 150 MG capsule Commonly known as:  CLEOCIN Take 600 mg by mouth See admin instructions. Take 4 capsules (600 mg) by mouth 1 hour prior to dental appointments.   darunavir 800 MG tablet Commonly known as:  Prezista TAKE 1 TABLET (800 MG TOTAL) BY MOUTH DAILY.   elvitegravir-cobicistat-emtricitabine-tenofovir 150-150-200-10 MG Tabs tablet Commonly known as:  Genvoya Take 1 tablet by mouth daily with breakfast.   escitalopram 20 MG tablet Commonly known as:  LEXAPRO Take 20 mg by mouth daily.   famotidine 40  MG tablet Commonly known as:  PEPCID TAKE 1 TABLET BY MOUTH TWICE DAILY FOR 2 MONTHS What changed:  See the new instructions.   folic acid 1 MG tablet Commonly known as:  FOLVITE Take 1 mg by mouth daily.   furosemide 20 MG tablet Commonly known as:  LASIX Take 1 tablet (20 mg total) by mouth 2 (two) times daily.   HYDROcodone-acetaminophen 5-325 MG tablet Commonly known as:  NORCO/VICODIN Take 1-2 tablets by mouth every 4 (four) hours as needed for moderate pain. What changed:  when to take this   leflunomide 20 MG tablet Commonly known as:  ARAVA Take 20 mg by mouth daily.   nitroGLYCERIN 0.4 MG SL tablet Commonly known as:  NITROSTAT Place 1 tablet (0.4 mg total) under the tongue every 5 (five) minutes as needed for chest pain.   oxyCODONE 5 MG immediate release tablet Commonly known as:  Oxy IR/ROXICODONE Take 1 tablet (5 mg total) by mouth every 6 (six) hours as needed for moderate pain (pain score 4-6).   predniSONE  5 MG tablet Commonly known as:  DELTASONE Take 5 mg by mouth 2 (two) times daily.   pregabalin 200 MG capsule Commonly known as:  LYRICA TAKE 1 CAPSULE BY MOUTH TWICE A DAY   Rexulti 0.25 MG Tabs Generic drug:  Brexpiprazole Take 0.25 mg by mouth daily.   Savaysa 30 MG Tabs tablet Generic drug:  edoxaban TAKE 1 TABLET (30 MG TOTAL) BY MOUTH DAILY.   sulfamethoxazole-trimethoprim 800-160 MG tablet Commonly known as:  BACTRIM DS,SEPTRA DS TAKE 1 TABLET BY MOUTH EVERY 12 (TWELVE) HOURS.   valACYclovir 500 MG tablet Commonly known as:  VALTREX Take 1 tablet (500 mg total) by mouth daily.            Durable Medical Equipment  (From admission, onward)         Start     Ordered   08/13/18 1057  DME Walker rolling  Once    Question:  Patient needs a walker to treat with the following condition  Answer:  Status post total replacement of left hip   08/13/18 1056   08/13/18 1057  DME 3 n 1  Once     08/13/18 1056           Discharge  Care Instructions  (From admission, onward)         Start     Ordered   08/15/18 0000  Weight bearing as tolerated    Question Answer Comment  Laterality bilateral   Extremity Lower      08/15/18 0843          Diagnostic Studies: Mr Jeri Cos IH Contrast  Result Date: 07/17/2018 CLINICAL DATA:  Eye swelling for 6 months, occasional blurry vision and diplopia. History of leukemia, HIV, hypertension, rheumatoid arthritis. EXAM: MRI HEAD AND ORBITS WITHOUT AND WITH CONTRAST TECHNIQUE: Multiplanar, multiecho pulse sequences of the brain and surrounding structures were obtained without and with intravenous contrast. Multiplanar, multiecho pulse sequences of the orbits and surrounding structures were obtained including fat saturation techniques, before and after intravenous contrast administration. CONTRAST:  13mL MULTIHANCE GADOBENATE DIMEGLUMINE 529 MG/ML IV SOLN COMPARISON:  CT HEAD February 21, 2017 and MRI of the head July 14, 2012 FINDINGS: MRI HEAD FINDINGS INTRACRANIAL CONTENTS: No reduced diffusion to suggest acute ischemia or hypercellular tumor. No susceptibility artifact to suggest hemorrhage. The ventricles and sulci are normal for patient's age. No suspicious parenchymal signal, masses, mass effect. A few scattered subcentimeter supratentorial white matter FLAIR T2 hyperintensities compatible with mild chronic small vessel ischemic changes, normal for age. No abnormal intraparenchymal or extra-axial enhancement. No abnormal extra-axial fluid collections. No extra-axial masses. VASCULAR: Normal major intracranial vascular flow voids present at skull base. SKULL AND UPPER CERVICAL SPINE: No abnormal sellar expansion. No suspicious calvarial bone marrow signal. Craniocervical junction maintained. OTHER: None. MRI ORBITS FINDINGS ORBITS: Ocular globes are intact with normal signal. Lenses are located. Preservation of the orbital fat. Normal appearance of the optic nerves and nerve sheaths. Normal  symmetric appearance of the extraocular muscles. No intra-ocular mass, signal abnormality nor abnormal enhancement. Superior ophthalmic veins are not enlarged. VISUALIZED SINUSES: Trace ethmoid mucosal thickening without paranasal sinus air-fluid levels. Included mastoid air cells are well aerated. SOFT TISSUES: Normal. IMPRESSION: 1. Normal MRI head with and without contrast for age. 2. Normal MRI of the orbits with and without contrast. Electronically Signed   By: Elon Alas M.D.   On: 07/17/2018 03:47   Dg Pelvis Portable  Result Date: 08/13/2018 CLINICAL DATA:  Status  post left total hip replacement. EXAM: PORTABLE PELVIS 1-2 VIEWS COMPARISON:  Intraoperative radiographs dated 08/13/2018 and MRI dated 06/03/2018 FINDINGS: The components of the left total hip prosthesis appear in excellent position in the AP projection. Postsurgical air in the soft tissues around the left hip. The visualized pelvic bones are normal. Previous lower lumbar fusion. IMPRESSION: Satisfactory appearance of the pelvis in the AP projection after left total hip prosthesis insertion. Electronically Signed   By: Lorriane Shire M.D.   On: 08/13/2018 10:49   Dg C-arm 1-60 Min  Result Date: 08/13/2018 CLINICAL DATA:  68 year old male with elective hip surgery EXAM: OPERATIVE LEFT HIP (WITH PELVIS IF PERFORMED)  VIEWS TECHNIQUE: Fluoroscopic spot image(s) were submitted for interpretation post-operatively. COMPARISON:  08/09/2018 FINDINGS: Limited intraoperative fluoroscopic spot images demonstrates left hip arthroplasty. IMPRESSION: Left hip arthroplasty. Please refer to the dictated operative report for full details of intraoperative findings and procedure. Electronically Signed   By: Corrie Mckusick D.O.   On: 08/13/2018 09:47   Dg Hip Operative Unilat W Or W/o Pelvis Left  Result Date: 08/13/2018 CLINICAL DATA:  68 year old male with elective hip surgery EXAM: OPERATIVE LEFT HIP (WITH PELVIS IF PERFORMED)  VIEWS TECHNIQUE:  Fluoroscopic spot image(s) were submitted for interpretation post-operatively. COMPARISON:  08/09/2018 FINDINGS: Limited intraoperative fluoroscopic spot images demonstrates left hip arthroplasty. IMPRESSION: Left hip arthroplasty. Please refer to the dictated operative report for full details of intraoperative findings and procedure. Electronically Signed   By: Corrie Mckusick D.O.   On: 08/13/2018 09:47   Xr Hip Unilat W Or W/o Pelvis 1v Left  Result Date: 08/09/2018 An AP pelvis and a lateral of the left hip shows avascular necrosis with flattening of the femoral head and evidence of collapse.  Mr Rosealee Albee Wo Contrast  Result Date: 07/17/2018 CLINICAL DATA:  Eye swelling for 6 months, occasional blurry vision and diplopia. History of leukemia, HIV, hypertension, rheumatoid arthritis. EXAM: MRI HEAD AND ORBITS WITHOUT AND WITH CONTRAST TECHNIQUE: Multiplanar, multiecho pulse sequences of the brain and surrounding structures were obtained without and with intravenous contrast. Multiplanar, multiecho pulse sequences of the orbits and surrounding structures were obtained including fat saturation techniques, before and after intravenous contrast administration. CONTRAST:  31mL MULTIHANCE GADOBENATE DIMEGLUMINE 529 MG/ML IV SOLN COMPARISON:  CT HEAD February 21, 2017 and MRI of the head July 14, 2012 FINDINGS: MRI HEAD FINDINGS INTRACRANIAL CONTENTS: No reduced diffusion to suggest acute ischemia or hypercellular tumor. No susceptibility artifact to suggest hemorrhage. The ventricles and sulci are normal for patient's age. No suspicious parenchymal signal, masses, mass effect. A few scattered subcentimeter supratentorial white matter FLAIR T2 hyperintensities compatible with mild chronic small vessel ischemic changes, normal for age. No abnormal intraparenchymal or extra-axial enhancement. No abnormal extra-axial fluid collections. No extra-axial masses. VASCULAR: Normal major intracranial vascular flow voids  present at skull base. SKULL AND UPPER CERVICAL SPINE: No abnormal sellar expansion. No suspicious calvarial bone marrow signal. Craniocervical junction maintained. OTHER: None. MRI ORBITS FINDINGS ORBITS: Ocular globes are intact with normal signal. Lenses are located. Preservation of the orbital fat. Normal appearance of the optic nerves and nerve sheaths. Normal symmetric appearance of the extraocular muscles. No intra-ocular mass, signal abnormality nor abnormal enhancement. Superior ophthalmic veins are not enlarged. VISUALIZED SINUSES: Trace ethmoid mucosal thickening without paranasal sinus air-fluid levels. Included mastoid air cells are well aerated. SOFT TISSUES: Normal. IMPRESSION: 1. Normal MRI head with and without contrast for age. 2. Normal MRI of the orbits with and without contrast. Electronically  Signed   By: Elon Alas M.D.   On: 07/17/2018 03:47    Patient benefited maximally from their hospital stay and there were no complications.     Disposition: Discharge disposition: 01-Home or Self Care      Discharge Instructions    Call MD / Call 911   Complete by:  As directed    If you experience chest pain or shortness of breath, CALL 911 and be transported to the hospital emergency room.  If you develope a fever above 101 F, pus (white drainage) or increased drainage or redness at the wound, or calf pain, call your surgeon's office.   Constipation Prevention   Complete by:  As directed    Drink plenty of fluids.  Prune juice may be helpful.  You may use a stool softener, such as Colace (over the counter) 100 mg twice a day.  Use MiraLax (over the counter) for constipation as needed.   Diet - low sodium heart healthy   Complete by:  As directed    Increase activity slowly as tolerated   Complete by:  As directed    Weight bearing as tolerated   Complete by:  As directed    Laterality:  bilateral   Extremity:  Lower     Follow-up Information    Mcarthur Rossetti, MD. Go on 08/30/2018.   Specialty:  Orthopedic Surgery Why:  Your follow up with MD is scheduled at 9:45 am. Contact information: [place] [address] [phone]        [facility] Follow up.   Why:  [place] will be in contact upon discharge. You are approved for 5 visits of home health prior to your MD follow up appointment.           Signed: Newt Minion 08/15/2018, 8:43 AM

## 2018-08-15 NOTE — Consult Note (Signed)
ORTHOPAEDIC CONSULTATION  REQUESTING PHYSICIAN: Mcarthur Rossetti,*  Chief Complaint: Dislocated left total hip arthroplasty.  HPI: [name] is a 68 y.o. male who presents with a dislocated left total hip.  Patient was working with therapy preparing for discharge when he slipped extended his hip and caused a dislocation of the left total hip arthroplasty.  Radiographs confirmed dislocation.  Past Medical History:  Diagnosis Date  . Allergy   . Anxiety   . Carotid artery occlusion    40-60% right ICA stenosis (09/2008)  . Cataract   . Chronic back pain   . CLL (chronic lymphoblastic leukemia) dx 2010   Followed at [facility] q6mo, no current therapy   . Clotting disorder ([place])   . Coronary artery disease 2010   s/p CABG '10, sees Dr. Percival Spanish  . Depression   . Diverticulosis   . DVT, lower extremity, recurrent ([place]) 2008, 2009   LLE, chronic anticoag since 2009  . Esophagitis   . Fibromyalgia   . Gallstones   . GERD (gastroesophageal reflux disease)   . Gout   . Gynecomastia, male   . H/O hiatal hernia 2008   surgery  . Hemorrhoids   . Hepatitis A yrs ago  . HIV infection ([place]) dx 1993  . Hypertension   . Impotence of organic origin   . Myocardial infarction ([place]) 2010    x 2  . Neuromuscular disorder ([facility])    neuropathy  . Osteoarthritis, knee    s/p B TKA  . Osteoporosis   . Pneumonia mrach, may, july 2017  . Rheumatoid arthritis(714.0) dx 2010   MTX, follows with rheum  . Seasonal allergies   . Secondary syphilis 07/24/14 dx   s/p 2 wks doxy  . Status post dilation of esophageal narrowing   . Stroke ([place]   . TIA (transient ischemic attack) 1997   mild residual L mouth droop  . Tubular adenoma of colon    Past Surgical History:  Procedure Laterality Date  . CHOLECYSTECTOMY    . COLONOSCOPY WITH PROPOFOL N/A 12/28/2012   Procedure: COLONOSCOPY WITH PROPOFOL;  Surgeon: Jerene Bears, MD;  Location: [facility];  Service:  Gastroenterology;  Laterality: N/A;  . CORONARY ARTERY BYPASS GRAFT  2010   triple bypass  . ESOPHAGOGASTRODUODENOSCOPY (EGD) WITH PROPOFOL N/A 12/28/2012   Procedure: ESOPHAGOGASTRODUODENOSCOPY (EGD) WITH PROPOFOL;  Surgeon: Jerene Bears, MD;  [facility];  Service: Gastroenterology;  Laterality: N/A;  . ESOPHAGOGASTRODUODENOSCOPY (EGD) WITH PROPOFOL N/A 03/15/2013   Procedure: ESOPHAGOGASTRODUODENOSCOPY (EGD) WITH PROPOFOL;  Surgeon: Jerene Bears, MD;  [facility];  Service: Gastroenterology;  Laterality: N/A;  . ESOPHAGOGASTRODUODENOSCOPY (EGD) WITH PROPOFOL N/A 02/07/2016   Procedure: ESOPHAGOGASTRODUODENOSCOPY (EGD) WITH PROPOFOL;  Surgeon: Jerene Bears, MD;  [facility];  Service: Gastroenterology;  Laterality: N/A;  . HARDWARE REMOVAL N/A 07/02/2012   Procedure: HARDWARE REMOVAL;  Surgeon: Elaina Hoops, MD;  Location: [facility];  Service: Neurosurgery;  Laterality: N/A;  . HIATAL HERNIA REPAIR     wrap  . INGUINAL HERNIA REPAIR Bilateral   . JOINT REPLACEMENT Left 1999  . KNEE ARTHROPLASTY  07/22/2011   Procedure: COMPUTER ASSISTED TOTAL KNEE ARTHROPLASTY;  Surgeon: Meredith Pel, MD;  Location: [facility];  Service: Orthopedics;  Laterality: Right;  Right total knee arthroplasty  . MANDIBLE SURGERY Bilateral    tmj  . REPLACEMENT TOTAL KNEE Bilateral   . ring around testicle hernia reapir  184 and  1986   x 2  . ROTATOR CUFF REPAIR Right   . SHOULDER SURGERY Left   . SPINE SURGERY  2010   "rod and screws", "failed", lopwer spine,   . stent to heart x 1  2010  . TONSILLECTOMY    . UMBILICAL HERNIA REPAIR     x 1  . varicose vein     stripping  . VIDEO BRONCHOSCOPY Bilateral 10/16/2015   Procedure: VIDEO BRONCHOSCOPY WITHOUT FLUORO;  Surgeon: Brand Males, MD;  [facility];  Service: Cardiopulmonary;  Laterality: Bilateral;   Social History   Socioeconomic History  . Marital status: Widowed    Spouse name: Not on file  . Number  of children: 3  . Years of education: Not on file  . Highest education level: Bachelor's degree (e.g., BA, AB, BS)  Occupational History  . Occupation: retired  Scientific laboratory technician  . Financial resource strain: Not on file  . Food insecurity:    Worry: Not on file    Inability: Not on file  . Transportation needs:    Medical: Not on file    Non-medical: Not on file  Tobacco Use  . Smoking status: Never Smoker  . Smokeless tobacco: Never Used  . Tobacco comment: occ wine  Substance and Sexual Activity  . Alcohol use: Yes    Comment: occasional wine- 1-2 per week  . Drug use: No  . Sexual activity: Not Currently    Comment: declined condoms  Lifestyle  . Physical activity:    Days per week: Not on file    Minutes per session: Not on file  . Stress: Not on file  Relationships  . Social connections:    Talks on phone: Not on file    Gets together: Not on file    Attends religious service: Not on file    Active member of club or organization: Not on file    Attends meetings of clubs or organizations: Not on file    Relationship status: Not on file  Other Topics Concern  . Not on file  Social History Narrative   Patient is right-handed. He is a widower. He lives alone in a single level home. He is active around his home. He drinks 2-3 cups of coffee a day.   Family History  Problem Relation Age of Onset  . Breast cancer Mother   . Hypertension Mother   . Hyperlipidemia Mother   . Diabetes Mother   . Prostate cancer Father   . Colon polyps Father   . Hyperlipidemia Father   . Crohn's disease Paternal Aunt   . Diabetes Maternal Grandmother   . Diabetes Brother        x 3  . Heart attack Brother   . Heart disease Brother        x 3  . Heart attack Brother   . Hyperlipidemia Brother        x 3  . Alcohol abuse Daughter   . Drug abuse Daughter   . Asthma Brother   . CVA Brother   . Colon cancer Neg Hx   . Esophageal cancer Neg Hx   . Rectal cancer Neg Hx   . Stomach  cancer Neg Hx    - negative except otherwise stated in the family history section Allergies  Allergen Reactions  . Golimumab Anaphylaxis    simponi aria  . Orencia [Abatacept] Anaphylaxis  . Other Anaphylaxis and Hives    Pecan  . Peanut-Containing Drug Products  Anaphylaxis, Hives and Swelling    Swelling of throat  . Morphine Other (See Comments)    REACTION: severe headache  . Oxycodone-Acetaminophen Other (See Comments)    REACTION: headache  . Penicillins Rash and Other (See Comments)    FLUSHED, RED  Has patient had a PCN reaction causing immediate rash, facial/tongue/throat swelling, SOB or lightheadedness with hypotension: #  #  #  YES  #  #  #  Has patient had a PCN reaction causing severe rash involving mucus membranes or skin necrosis: No Has patient had a PCN reaction that required hospitalization No Has patient had a PCN reaction occurring within the last 10 years: No If all of the above answers are "NO", then may proceed with Cephalosporin use  . Promethazine Hcl Other (See Comments)    REACTION: makes him feel drunk at higher strengths   Prior to Admission medications   Medication Sig Start Date End Date Taking? Authorizing Provider  acetaminophen-codeine (TYLENOL #3) 300-30 MG tablet Take 1 tablet by mouth every 8 (eight) hours as needed for moderate pain. 06/15/18  Yes Meredith Pel, MD  atorvastatin (LIPITOR) 10 MG tablet Take 1 tablet (10 mg total) by mouth at bedtime. 04/05/18  Yes [name], MD  darunavir (PREZISTA) 800 MG tablet TAKE 1 TABLET (800 MG TOTAL) BY MOUTH DAILY. 08/06/18  Yes Campbell Riches, MD  elvitegravir-cobicistat-emtricitabine-tenofovir (GENVOYA) 150-150-200-10 MG TABS tablet Take 1 tablet by mouth daily with breakfast. 08/06/18  Yes Campbell Riches, MD  escitalopram (LEXAPRO) 20 MG tablet Take 20 mg by mouth daily. 07/07/18  Yes [provider]  famotidine (PEPCID) 40 MG tablet TAKE 1 TABLET BY MOUTH TWICE DAILY FOR 2  MONTHS Patient taking differently: Take 40 mg by mouth daily.  08/06/18  Yes Pyrtle, Lajuan Lines, MD  folic acid (FOLVITE) 1 MG tablet Take 1 mg by mouth daily. 09/17/17  Yes [provider]  furosemide (LASIX) 20 MG tablet Take 1 tablet (20 mg total) by mouth 2 (two) times daily. 02/10/18  Yes [name], MD  leflunomide (ARAVA) 20 MG tablet Take 20 mg by mouth daily.  07/10/18  Yes [provider]  nitroGLYCERIN (NITROSTAT) 0.4 MG SL tablet Place 1 tablet (0.4 mg total) under the tongue every 5 (five) minutes as needed for chest pain. 12/24/17 07/26/19 Yes Hochrein, Jeneen Rinks, MD  predniSONE (DELTASONE) 5 MG tablet Take 5 mg by mouth 2 (two) times daily.  11/02/16  Yes [provider]  pregabalin (LYRICA) 200 MG capsule TAKE 1 CAPSULE BY MOUTH TWICE A DAY Patient taking differently: Take 200 mg by mouth 2 (two) times daily.  07/27/18  Yes [name], MD  REXULTI 0.25 MG TABS Take 0.25 mg by mouth daily.  03/02/18  Yes [provider]  SAVAYSA 30 MG TABS tablet TAKE 1 TABLET (30 MG TOTAL) BY MOUTH DAILY. 08/06/18  Yes [name], MD  sulfamethoxazole-trimethoprim (BACTRIM DS,SEPTRA DS) 800-160 MG tablet TAKE 1 TABLET BY MOUTH EVERY 12 (TWELVE) HOURS. 08/06/18  Yes [name], MD  valACYclovir (VALTREX) 500 MG tablet Take 1 tablet (500 mg total) by mouth daily. 01/27/18  Yes Campbell Riches, MD  clindamycin (CLEOCIN) 150 MG capsule Take 600 mg by mouth See admin instructions. Take 4 capsules (600 mg) by mouth 1 hour prior to dental appointments. 09/25/16   [provider]  HYDROcodone-acetaminophen (NORCO/VICODIN) 5-325 MG tablet Take 1-2 tablets by mouth every 6 (six) hours as needed for moderate pain.  08/09/18   Mcarthur Rossetti, MD  HYDROcodone-acetaminophen (NORCO/VICODIN) 5-325 MG tablet Take 1-2 tablets by mouth every 4 (four) hours as needed for moderate pain. 08/15/18   Newt Minion, MD  oxyCODONE (OXY IR/ROXICODONE) 5 MG immediate release  tablet Take 1 tablet (5 mg total) by mouth every 6 (six) hours as needed for moderate pain (pain score 4-6). 08/14/18   Mcarthur Rossetti, MD   Dg Hip Port Unilat With Pelvis 1v Left  Result Date: 08/15/2018 CLINICAL DATA:  Left hip pain EXAM: DG HIP (WITH OR WITHOUT PELVIS) 1V PORT LEFT COMPARISON:  None. FINDINGS: Total left hip arthroplasty with dislocation. No evidence of fracture on the single projection IMPRESSION: Dislocated left hip arthroplasty. Electronically Signed   By: Monte Fantasia M.D.   On: 08/15/2018 10:47   - pertinent xrays, CT, MRI studies were reviewed and independently interpreted  Positive ROS: All other systems have been reviewed and were otherwise negative with the exception of those mentioned in the HPI and as above.  Physical Exam: General: Alert, no acute distress Psychiatric: Patient is competent for consent with normal mood and affect Lymphatic: No axillary or cervical lymphadenopathy Cardiovascular: No pedal edema Respiratory: No cyanosis, no use of accessory musculature GI: No organomegaly, abdomen is soft and non-tender    Images:  @ENCIMAGES @  Labs:  Lab Results  Component Value Date   HGBA1C 5.2 12/18/2015   HGBA1C 4.7 09/05/2014   HGBA1C 5.1 11/02/2013   REPTSTATUS 07/07/2016 FINAL 07/02/2016   GRAMSTAIN  07/02/2016    FEW WBC PRESENT,BOTH PMN AND MONONUCLEAR NO ORGANISMS SEEN    CULT No growth aerobically or anaerobically. 07/02/2016   LABORGA No Salmonella,Shigella,Campylobacter,Yersinia,or 01/22/2015   LABORGA No E.coli 0157:H7 isolated. 01/22/2015    Lab Results  Component Value Date   ALBUMIN 3.4 (L) 05/13/2018   ALBUMIN 4.0 11/19/2017   ALBUMIN 3.9 12/25/2016    Neurologic: Patient does  have protective sensation bilateral lower extremities.   MUSCULOSKELETAL:   Skin: Patient does have ecchymosis and bruising around the surgical incision.  Patient has pain with attempted range of motion of the hip.  Radiographs  shows a dislocation of the total hip arthroplasty without fracture.  Assessment: Assessment: Dislocation total hip arthroplasty.  Plan: Plan for close reduction of the left total hip arthroplasty.  Risks and benefits were discussed including potential for recurrent dislocation.  Thank you for the consult and the opportunity to see Mr. [place], MD [place] [phone] 12:12 PM

## 2018-08-15 NOTE — Progress Notes (Signed)
Patient ID: [name], male   DOB: Mar 05, 1951, 68 y.o.   MRN: [id number] I came to see the patient this afternoon to get a better idea of the position he was in and what he was doing when his left prosthetic hip joint dislocated.  He is just postoperative day 2.  He is someone who we scrutinize significantly prior to his surgery to get an idea of his pelvis mobility and light of significant spinal kyphosis.  Intraoperatively I assessed the implants in great detail feeling that I had them in the correct position prior to leaving the operating room.  I felt like I tightened his offset and gave him more length but more importantly, I felt that the version of the acetabular component and femoral components were correct based on preoperative assessment of his pelvis.  The patient states that he was sitting in his chair trying to get a shoe on his left foot and was very much flexed forward and bending at the waist.  He then describes pushing his leg in an internally rotated position and the hip came out.  That makes me believe that he came out posteriorly.  My partner was able to take him to the operating room and reduce his hip.  Postoperative x-ray showed the hip is in a reduced position.  He is sitting in bed now in a knee immobilizer and comfortable.  His left operative leg is longer than the right side as it was we left the operating room.  I told him in detail about strict posterior hip precautions but also I discussed the potential need to take him back to the operating room in this acute setting while we have a better potential of realigning the components based on what we are seeing postoperatively.  That would hopefully lead to better success of keeping this hip in place and not impinging which would lead to further dislocations.  As of now I will have him on strict posterior hip precautions and will have therapy work with him prior to likely setting him up for revision surgery this Tuesday.  He  understands this as well.  All question concerns were answered and addressed.

## 2018-08-15 NOTE — Anesthesia Procedure Notes (Signed)
Procedure Name: Intubation Date/Time: 08/15/2018 12:27 PM Performed by: Julieta Bellini, CRNA Pre-anesthesia Checklist: Patient identified, Emergency Drugs available, Suction available and Patient being monitored Patient Re-evaluated:Patient Re-evaluated prior to induction Oxygen Delivery Method: Circle system utilized Preoxygenation: Pre-oxygenation with 100% oxygen Induction Type: IV induction, Rapid sequence and Cricoid Pressure applied Laryngoscope Size: Mac and 4 Grade View: Grade I Tube type: Oral Tube size: 7.5 mm Number of attempts: 1 Airway Equipment and Method: Stylet and Oral airway Placement Confirmation: ETT inserted through vocal cords under direct vision,  positive ETCO2 and breath sounds checked- equal and bilateral Secured at: 23 cm Tube secured with: Tape Dental Injury: Teeth and Oropharynx as per pre-operative assessment

## 2018-08-15 NOTE — Progress Notes (Signed)
PT Cancellation Note  Patient Details Name: [name] MRN: [id number] DOB: 1951/04/19   Cancelled Treatment:    Reason Eval/Treat Not Completed: Medical issues which prohibited therapy. On chart review noted pt to discharge today and on arrival to floor performed second chart review noting recent event of possible popping out of hip. Xray pending. Will follow up at later time pending results of stat xray.   Willow Ora, PTA, [place] [phone] 08/15/18, 10:34 AM   Willow Ora 08/15/2018, 10:32 AM

## 2018-08-15 NOTE — Progress Notes (Signed)
PT Cancellation Note  Patient Details Name: [name] MRN: [id number] DOB: 1950-07-10   Cancelled Treatment:    Reason Eval/Treat Not Completed: Medical issues which prohibited therapy. Xray shows complete dislocation of total hip arthoplasty. Pt going back to OR today for surgery to repair dislocation. Hold PT unitl reordered after surgery. PT to see for re-evaluation at that time.   Willow Ora, PTA, [place] [phone] 08/15/18, 11:33 AM   Willow Ora 08/15/2018, 11:30 AM

## 2018-08-15 NOTE — Transfer of Care (Signed)
Immediate Anesthesia Transfer of Care Note  Patient: [name]  Procedure(s) Performed: CLOSED REDUCTION HIP (Left Hip)  Patient Location: PACU  Anesthesia Type:General  Level of Consciousness: awake, alert , oriented and patient cooperative  Airway & Oxygen Therapy: Patient Spontanous Breathing  Post-op Assessment: Report given to RN, Post -op Vital signs reviewed and stable and Patient moving all extremities X 4  Post vital signs: Reviewed and stable  Last Vitals:  Vitals Value Taken Time  BP 138/78 08/15/2018 12:41 PM  Temp    Pulse 95 08/15/2018 12:44 PM  Resp 25 08/15/2018 12:44 PM  SpO2 94 % 08/15/2018 12:44 PM  Vitals shown include unvalidated device data.  Last Pain:  Vitals:   08/15/18 1128  TempSrc: Oral  PainSc:          Complications: No apparent anesthesia complications

## 2018-08-15 NOTE — Progress Notes (Signed)
Orthopedic Tech Progress Note Patient Details:  [name] 1950/06/07 [id number]  Ortho Devices Type of Ortho Device: Knee Immobilizer Ortho Device/Splint Interventions: Application   Post Interventions Patient Tolerated: Well Instructions Provided: Care of device   [name] 08/15/2018, 7:20 PM

## 2018-08-15 NOTE — Anesthesia Postprocedure Evaluation (Signed)
Anesthesia Post Note  Patient: [name]  Procedure(s) Performed: CLOSED REDUCTION HIP (Left Hip)     Patient location during evaluation: PACU Anesthesia Type: General Level of consciousness: awake and alert Pain management: pain level controlled Vital Signs Assessment: post-procedure vital signs reviewed and stable Respiratory status: spontaneous breathing, nonlabored ventilation and respiratory function stable Cardiovascular status: blood pressure returned to baseline and stable Postop Assessment: no apparent nausea or vomiting Anesthetic complications: no    Last Vitals:  Vitals:   08/15/18 1240 08/15/18 1255  BP: 138/78 113/80  Pulse: 85 86  Resp: 14 10  Temp: 36.4 C   SpO2: 95% 95%    Last Pain:  Vitals:   08/15/18 1240  TempSrc:   PainSc: 0-No pain                 Tammara Massing,W. EDMOND

## 2018-08-15 NOTE — Anesthesia Preprocedure Evaluation (Addendum)
Anesthesia Evaluation  Patient identified by MRN, date of birth, ID band Patient awake    Reviewed: Allergy & Precautions, H&P , NPO status , Patient's Chart, lab work & pertinent test results  Airway Mallampati: III  TM Distance: >3 FB Neck ROM: Full    Dental no notable dental hx. (+) Teeth Intact, Dental Advisory Given   Pulmonary neg pulmonary ROS,    Pulmonary exam normal breath sounds clear to auscultation       Cardiovascular hypertension, Pt. on medications + CAD, + Past MI and + CABG   Rhythm:Regular Rate:Normal     Neuro/Psych Anxiety Depression TIA   GI/Hepatic hiatal hernia, PUD, GERD  Medicated,(+) Hepatitis -, C  Endo/Other  negative endocrine ROS  Renal/GU negative Renal ROS  negative genitourinary   Musculoskeletal  (+) Arthritis , Fibromyalgia -  Abdominal   Peds  Hematology  (+) HIV,   Anesthesia Other Findings   Reproductive/Obstetrics negative OB ROS                            Anesthesia Physical Anesthesia Plan  ASA: III and emergent  Anesthesia Plan: General   Post-op Pain Management:    Induction: Intravenous, Rapid sequence and Cricoid pressure planned  PONV Risk Score and Plan: 3 and Ondansetron, Dexamethasone and Treatment may vary due to age or medical condition  Airway Management Planned: Oral ETT  Additional Equipment:   Intra-op Plan:   Post-operative Plan: Extubation in OR  Informed Consent: I have reviewed the patients History and Physical, chart, labs and discussed the procedure including the risks, benefits and alternatives for the proposed anesthesia with the patient or authorized representative who has indicated his/her understanding and acceptance.     Dental advisory given  Plan Discussed with: CRNA  Anesthesia Plan Comments:        Anesthesia Quick Evaluation

## 2018-08-16 ENCOUNTER — Encounter (HOSPITAL_COMMUNITY): Payer: Self-pay | Admitting: Orthopaedic Surgery

## 2018-08-16 LAB — CBC
HCT: 30.7 % — ABNORMAL LOW (ref 39.0–52.0)
Hemoglobin: 10.1 g/dL — ABNORMAL LOW (ref 13.0–17.0)
MCH: 35.1 pg — ABNORMAL HIGH (ref 26.0–34.0)
MCHC: 32.9 g/dL (ref 30.0–36.0)
MCV: 106.6 fL — ABNORMAL HIGH (ref 80.0–100.0)
Platelets: 101 10*3/uL — ABNORMAL LOW (ref 150–400)
RBC: 2.88 MIL/uL — ABNORMAL LOW (ref 4.22–5.81)
RDW: 13.2 % (ref 11.5–15.5)
WBC: 10.5 10*3/uL (ref 4.0–10.5)
nRBC: 0 % (ref 0.0–0.2)

## 2018-08-16 MED ORDER — BREXPIPRAZOLE 0.5 MG PO TABS
0.2500 mg | ORAL_TABLET | Freq: Every day | ORAL | Status: DC
  Filled 2018-08-16: qty 1

## 2018-08-16 MED FILL — Brexpiprazole Tab 1 MG: ORAL | Qty: 0.25 | Status: AC

## 2018-08-16 NOTE — Progress Notes (Signed)
Patient ID: [name], male   DOB: 1951-02-18, 68 y.o.   MRN: [id number] The patient did work with physical therapy today using his knee immobilizer on the left side with strict posterior hip precautions.  I did speak personally to the physical therapist and the patient.  He is struggling with these precautions.  Given the acute dislocation of his left hip, I do feel that it is warranted and medically necessary to go ahead and proceed to surgery tomorrow to revise the components in a way to stabilize his hip better.  Again, preoperatively I thoroughly assessed his pelvis in light of his severe kyphosis to make sure we were placing the components in and his optimal is position as possible to avoid hip instability.  He is still has been able to leverage out his component and it appears to me that it is a posterior dislocation in spite of [place] going through the front.  My plan would be to take him down to surgery and to place him on the Hana table and then assess his hip fluoroscopically prior to make an incision and prepping his hip out so we can better understand the forces that are causing this dislocation and the direction.  We would then be able to better plan for repositioning of the components which may involve both the acetabular and femoral component.  On exam today, his hip is clinically located and he feels like it is.  He is longer on that left operative side as he was postoperative on Friday.  Given the blood thinning medication that he is on, he is draining quite a bit from that left hip with bloody drainage and I did change the dressing myself.  There is significant bruising in this area as well.  His blood thinning medication has been stopped.  I spoke with him in detail about the rationale and reasoning behind returning to surgery and feel that it is again in his best interest.  My fear is that we sent him home and he would dislocate immediately.  I do not think it is a compliance issue is a much as  it is his anatomy and his kyphosis of the lumbar spine as well as this fixed pelvis.  I have reviewed this case with several colleagues and they agree with this approach as well.  We will schedule him for surgery tomorrow (Tuesday, April 7).  He will be n.p.o. after midnight tonight.  Hemoglobin today was stable at 10.1.

## 2018-08-16 NOTE — Care Management Important Message (Signed)
Important Message  Patient Details  Name: [name] MRN: [id number] Date of Birth: Nov 20, 1950   Medicare Important Message Given:  Yes    [name] 08/16/2018, 4:28 PM

## 2018-08-16 NOTE — Progress Notes (Signed)
Physical Therapy Treatment Patient Details Name: [name] MRN: [id number] DOB: 16-Dec-1950 Today's Date: 08/16/2018    History of Present Illness 68 y.o. male, has a history of pain and functional disability in the left hip(s) due to avascular necrosis with femoral head collapse. s/p Left total hip arthroplasty through direct anterior approach 08/13/2018 PMH: CAD, RA, recurrent DVT ; multiple back surgeries    PT Comments    Pt is awaiting surgery tomorrow to correct hip arthroplasty post dislocation over the weekend.  Pt educated on posterior precautions and given handout for reminder. Pt with difficulty maintaining precaution for forward flexion precaution, due to pts very flexed resting posture. After using toilet pt requires assist for pericare as he report feeling as though his L hip would dislocate with trying to wipe. Overall, pt min guard for mobility. PT ordered OT for training on personal care with posterior hip precautions. PT will follow back post surgery tomorrow.   Follow Up Recommendations  Follow surgeon's recommendation for DC plan and follow-up therapies     Equipment Recommendations  None recommended by PT       Precautions / Restrictions Precautions Precautions: Posterior Hip Precaution Booklet Issued: Yes (comment) Precaution Comments: dislocation of L hip with getting on shoes for d/c  Required Braces or Orthoses: Knee Immobilizer - Left Knee Immobilizer - Left: On at all times(to reduce risk of dislocation ) Restrictions Weight Bearing Restrictions: Yes LLE Weight Bearing: Weight bearing as tolerated    Mobility  Bed Mobility Overal bed mobility: Needs Assistance             General bed mobility comments: Pt sitting EoB on entry trying to get up to the bathroom  Transfers Overall transfer level: Needs assistance Equipment used: Rolling walker (2 wheeled) Transfers: Sit to/from Stand Sit to Stand: Min guard;From elevated surface          General transfer comment: min guard for safety, increased time and effort to get L LE underneath him, difficulty with maintaining flexion precaution secondary to pt posture post multipl back surgeries  Ambulation/Gait Ambulation/Gait assistance: Min guard Gait Distance (Feet): 20 Feet Assistive device: Rolling walker (2 wheeled) Gait Pattern/deviations: Antalgic;Decreased weight shift to left;Decreased stance time - left;Decreased step length - right;Step-to pattern Gait velocity: slowed   General Gait Details: hands on min guard for safety, vc for more upright posture if possible         Balance Overall balance assessment: Needs assistance Sitting-balance support: Feet supported;No upper extremity supported Sitting balance-[name]: Good     Standing balance support: Bilateral upper extremity supported Standing balance-[name]: Poor                              Cognition Arousal/Alertness: Awake/alert Behavior During Therapy: WFL for tasks assessed/performed Overall Cognitive Status: Within Functional Limits for tasks assessed                                           General Comments General comments (skin integrity, edema, etc.): Pt with bloody drainage from superior bandage margin, large area of eccymosis around surgical site       Pertinent Vitals/Pain Pain Assessment: 0-10 Pain Score: 5  Pain Location: L hip  Pain Descriptors / Indicators: Aching           PT Goals (current  goals can now be found in the care plan section) Acute Rehab PT Goals Patient Stated Goal: have less pain with walking PT Goal Formulation: With patient Time For Goal Achievement: 08/27/18 Potential to Achieve Goals: Fair Progress towards PT goals: Progressing toward goals    Frequency    Min 5X/week      PT Plan Current plan remains appropriate       AM-PAC PT "6 Clicks" Mobility   Outcome Measure  Help needed turning from your back to  your side while in a flat bed without using bedrails?: A Little Help needed moving from lying on your back to sitting on the side of a flat bed without using bedrails?: A Little Help needed moving to and from a bed to a chair (including a wheelchair)?: A Little Help needed standing up from a chair using your arms (e.g., wheelchair or bedside chair)?: A Little Help needed to walk in hospital room?: A Little Help needed climbing 3-5 steps with a railing? : A Lot 6 Click Score: 17    End of Session Equipment Utilized During Treatment: Gait belt Activity Tolerance: Patient limited by pain Patient left: in chair;with call bell/phone within reach;with chair alarm set Nurse Communication: Mobility status;Precautions;Weight bearing status(request for laxitive due to constipation) PT Visit Diagnosis: Unsteadiness on feet (R26.81);Other abnormalities of gait and mobility (R26.89);Muscle weakness (generalized) (M62.81);Difficulty in walking, not elsewhere classified (R26.2);Pain Pain - Right/Left: Left Pain - part of body: Hip     Time: 0912-0940 PT Time Calculation (min) (ACUTE ONLY): 28 min  Charges:  $Gait Training: 23-37 mins                     Yassen Kinnett B. Migdalia Dk PT, DPT Acute Rehabilitation Services Pager [phone] Office [phone]    [place] 08/16/2018, 9:50 AM

## 2018-08-17 ENCOUNTER — Inpatient Hospital Stay (HOSPITAL_COMMUNITY): Payer: Medicare Other

## 2018-08-17 ENCOUNTER — Encounter (HOSPITAL_COMMUNITY): Payer: Self-pay

## 2018-08-17 ENCOUNTER — Other Ambulatory Visit: Payer: Self-pay

## 2018-08-17 ENCOUNTER — Inpatient Hospital Stay (HOSPITAL_COMMUNITY): Payer: Medicare Other | Admitting: Anesthesiology

## 2018-08-17 ENCOUNTER — Encounter (HOSPITAL_COMMUNITY)
Admission: RE | Disposition: A | Discharge status: Home Health | Payer: Self-pay | Source: Home / Self Care | Attending: Orthopaedic Surgery

## 2018-08-17 DIAGNOSIS — T84021A Dislocation of internal left hip prosthesis, initial encounter: Secondary | ICD-10-CM

## 2018-08-17 DIAGNOSIS — Z96642 Presence of left artificial hip joint: Secondary | ICD-10-CM

## 2018-08-17 HISTORY — PX: ANTERIOR HIP REVISION: SHX6527

## 2018-08-17 LAB — TYPE AND SCREEN
ABO/RH(D): A POS
Antibody Screen: NEGATIVE

## 2018-08-17 SURGERY — REVISION, TOTAL ARTHROPLASTY, HIP, ANTERIOR APPROACH
Anesthesia: General | Site: Hip | Laterality: Left

## 2018-08-17 MED ORDER — LIDOCAINE 2% (20 MG/ML) 5 ML SYRINGE
INTRAMUSCULAR | Status: DC | PRN
  Administered 2018-08-17: 100 mg via INTRAVENOUS

## 2018-08-17 MED ORDER — DEXAMETHASONE SODIUM PHOSPHATE 10 MG/ML IJ SOLN
INTRAMUSCULAR | Status: AC
  Filled 2018-08-17: qty 1

## 2018-08-17 MED ORDER — DEXAMETHASONE SODIUM PHOSPHATE 10 MG/ML IJ SOLN
INTRAMUSCULAR | Status: DC | PRN
  Administered 2018-08-17: 10 mg via INTRAVENOUS

## 2018-08-17 MED ORDER — FENTANYL CITRATE (PF) 250 MCG/5ML IJ SOLN
INTRAMUSCULAR | Status: AC
  Filled 2018-08-17: qty 5

## 2018-08-17 MED ORDER — LACTATED RINGERS IV SOLN
INTRAVENOUS | Status: DC | PRN
  Administered 2018-08-17: 14:00:00 via INTRAVENOUS

## 2018-08-17 MED ORDER — ROCURONIUM BROMIDE 10 MG/ML (PF) SYRINGE
PREFILLED_SYRINGE | INTRAVENOUS | Status: DC | PRN
  Administered 2018-08-17: 40 mg via INTRAVENOUS
  Administered 2018-08-17 (×2): 20 mg via INTRAVENOUS

## 2018-08-17 MED ORDER — ONDANSETRON HCL 4 MG/2ML IJ SOLN
INTRAMUSCULAR | Status: DC | PRN
  Administered 2018-08-17: 4 mg via INTRAVENOUS

## 2018-08-17 MED ORDER — PHENYLEPHRINE 40 MCG/ML (10ML) SYRINGE FOR IV PUSH (FOR BLOOD PRESSURE SUPPORT)
PREFILLED_SYRINGE | INTRAVENOUS | Status: DC | PRN
  Administered 2018-08-17: 200 ug via INTRAVENOUS
  Administered 2018-08-17: 120 ug via INTRAVENOUS

## 2018-08-17 MED ORDER — FENTANYL CITRATE (PF) 250 MCG/5ML IJ SOLN
INTRAMUSCULAR | Status: DC | PRN
  Administered 2018-08-17 (×3): 50 ug via INTRAVENOUS
  Administered 2018-08-17: 100 ug via INTRAVENOUS

## 2018-08-17 MED ORDER — ONDANSETRON HCL 4 MG/2ML IJ SOLN
INTRAMUSCULAR | Status: AC
  Filled 2018-08-17: qty 2

## 2018-08-17 MED ORDER — FENTANYL CITRATE (PF) 100 MCG/2ML IJ SOLN
INTRAMUSCULAR | Status: AC
  Filled 2018-08-17: qty 2

## 2018-08-17 MED ORDER — LABETALOL HCL 5 MG/ML IV SOLN
INTRAVENOUS | Status: DC | PRN
  Administered 2018-08-17: 5 mg via INTRAVENOUS

## 2018-08-17 MED ORDER — METHOCARBAMOL 500 MG PO TABS
ORAL_TABLET | ORAL | Status: AC
  Filled 2018-08-17: qty 1

## 2018-08-17 MED ORDER — FENTANYL CITRATE (PF) 100 MCG/2ML IJ SOLN: 25.0000 ug | INTRAMUSCULAR | Status: DC | PRN

## 2018-08-17 MED ORDER — PROPOFOL 10 MG/ML IV BOLUS
INTRAVENOUS | Status: AC
  Filled 2018-08-17: qty 20

## 2018-08-17 MED ORDER — SUCCINYLCHOLINE CHLORIDE 200 MG/10ML IV SOSY
PREFILLED_SYRINGE | INTRAVENOUS | Status: DC | PRN
  Administered 2018-08-17: 140 mg via INTRAVENOUS
  Administered 2018-08-17: 100 mg via INTRAVENOUS

## 2018-08-17 MED ORDER — PROPOFOL 10 MG/ML IV BOLUS
INTRAVENOUS | Status: DC | PRN
  Administered 2018-08-17: 170 mg via INTRAVENOUS

## 2018-08-17 MED ORDER — FENTANYL CITRATE (PF) 100 MCG/2ML IJ SOLN
25.0000 ug | INTRAMUSCULAR | Status: DC | PRN
  Administered 2018-08-17 (×3): 50 ug via INTRAVENOUS

## 2018-08-17 MED ORDER — 0.9 % SODIUM CHLORIDE (POUR BTL) OPTIME
TOPICAL | Status: DC | PRN
  Administered 2018-08-17: 13:00:00 1000 mL

## 2018-08-17 MED ORDER — MIDAZOLAM HCL 2 MG/2ML IJ SOLN
INTRAMUSCULAR | Status: AC
  Filled 2018-08-17: qty 2

## 2018-08-17 MED ORDER — MIDAZOLAM HCL 2 MG/2ML IJ SOLN
INTRAMUSCULAR | Status: DC | PRN
  Administered 2018-08-17: 1 mg via INTRAVENOUS

## 2018-08-17 MED ORDER — PHENYLEPHRINE 40 MCG/ML (10ML) SYRINGE FOR IV PUSH (FOR BLOOD PRESSURE SUPPORT)
PREFILLED_SYRINGE | INTRAVENOUS | Status: AC
  Filled 2018-08-17: qty 10

## 2018-08-17 MED ORDER — OXYCODONE HCL 5 MG PO TABS
ORAL_TABLET | ORAL | Status: AC
  Filled 2018-08-17: qty 1

## 2018-08-17 MED ORDER — ROCURONIUM BROMIDE 50 MG/5ML IV SOSY
PREFILLED_SYRINGE | INTRAVENOUS | Status: AC
  Filled 2018-08-17: qty 10

## 2018-08-17 MED ORDER — LIDOCAINE 2% (20 MG/ML) 5 ML SYRINGE
INTRAMUSCULAR | Status: AC
  Filled 2018-08-17: qty 5

## 2018-08-17 MED ORDER — SUGAMMADEX SODIUM 200 MG/2ML IV SOLN
INTRAVENOUS | Status: DC | PRN
  Administered 2018-08-17: 180 mg via INTRAVENOUS

## 2018-08-17 MED ORDER — LACTATED RINGERS IV SOLN
INTRAVENOUS | Status: DC | PRN
  Administered 2018-08-17: 12:00:00 via INTRAVENOUS

## 2018-08-17 MED ORDER — ONDANSETRON HCL 4 MG/2ML IJ SOLN: 4.0000 mg | Freq: Once | INTRAMUSCULAR | Status: DC | PRN

## 2018-08-17 MED ORDER — VANCOMYCIN HCL IN DEXTROSE 1-5 GM/200ML-% IV SOLN
INTRAVENOUS | Status: AC
  Filled 2018-08-17: qty 200

## 2018-08-17 MED ORDER — SODIUM CHLORIDE 0.9 % IR SOLN
Status: DC | PRN
  Administered 2018-08-17: 3000 mL

## 2018-08-17 MED ORDER — LABETALOL HCL 5 MG/ML IV SOLN
INTRAVENOUS | Status: AC
  Filled 2018-08-17: qty 4

## 2018-08-17 MED ORDER — SUCCINYLCHOLINE CHLORIDE 200 MG/10ML IV SOSY
PREFILLED_SYRINGE | INTRAVENOUS | Status: AC
  Filled 2018-08-17: qty 20

## 2018-08-17 MED ORDER — VANCOMYCIN HCL 1000 MG IV SOLR
INTRAVENOUS | Status: DC | PRN
  Administered 2018-08-17: 1000 mg via INTRAVENOUS

## 2018-08-17 MED ORDER — SODIUM CHLORIDE 0.9 % IV SOLN
INTRAVENOUS | Status: DC | PRN
  Administered 2018-08-17: 25 ug/min via INTRAVENOUS

## 2018-08-17 MED ORDER — BREXPIPRAZOLE 1 MG PO TABS
0.2500 mg | ORAL_TABLET | Freq: Every day | ORAL | Status: DC
  Administered 2018-08-17 – 2018-08-20 (×3): 0.25 mg via ORAL
  Filled 2018-08-17 (×5): qty 1

## 2018-08-17 SURGICAL SUPPLY — 55 items
BALL HIP ARTICU EZE 36 8.5 (Hips) IMPLANT
BLADE CLIPPER SURG (BLADE) IMPLANT
BLADE SAW SGTL 18X1.27X75 (BLADE) ×2 IMPLANT
CHLORAPREP W/TINT 26 (MISCELLANEOUS) ×1 IMPLANT
COVER SURGICAL LIGHT HANDLE (MISCELLANEOUS) ×2 IMPLANT
COVER WAND RF STERILE (DRAPES) ×2 IMPLANT
CUP ACET PNNCL SECTR W/GRIP 56 (Hips) IMPLANT
DRAPE C-ARM 42X72 X-RAY (DRAPES) ×2 IMPLANT
DRAPE INCISE IOBAN 66X45 STRL (DRAPES) ×1 IMPLANT
DRAPE STERI IOBAN 125X83 (DRAPES) ×2 IMPLANT
DRAPE U-SHAPE 47X51 STRL (DRAPES) ×5 IMPLANT
DRSG VAC ATS SM SENSATRAC (GAUZE/BANDAGES/DRESSINGS) ×1 IMPLANT
ELECT BLADE 4.0 EZ CLEAN MEGAD (MISCELLANEOUS) ×2
ELECT BLADE 6.5 EXT (BLADE) ×1 IMPLANT
ELECT REM PT RETURN 9FT ADLT (ELECTROSURGICAL) ×2
ELECTRODE BLDE 4.0 EZ CLN MEGD (MISCELLANEOUS) ×1 IMPLANT
ELECTRODE REM PT RTRN 9FT ADLT (ELECTROSURGICAL) ×1 IMPLANT
FACESHIELD WRAPAROUND (MASK) ×4 IMPLANT
FACESHIELD WRAPAROUND OR TEAM (MASK) ×2 IMPLANT
GLOVE BIOGEL PI IND STRL 8 (GLOVE) ×2 IMPLANT
GLOVE BIOGEL PI INDICATOR 8 (GLOVE) ×2
GLOVE ECLIPSE 8.0 STRL XLNG CF (GLOVE) ×2 IMPLANT
GLOVE ORTHO TXT STRL SZ7.5 (GLOVE) ×4 IMPLANT
GOWN STRL REUS W/ TWL LRG LVL3 (GOWN DISPOSABLE) ×2 IMPLANT
GOWN STRL REUS W/ TWL XL LVL3 (GOWN DISPOSABLE) ×2 IMPLANT
GOWN STRL REUS W/TWL LRG LVL3 (GOWN DISPOSABLE) ×2
GOWN STRL REUS W/TWL XL LVL3 (GOWN DISPOSABLE) ×2
HANDPIECE INTERPULSE COAX TIP (DISPOSABLE) ×1
HIP BALL ARTICU EZE 36 8.5 (Hips) ×2 IMPLANT
KIT BASIN OR (CUSTOM PROCEDURE TRAY) ×2 IMPLANT
KIT TURNOVER KIT B (KITS) ×2 IMPLANT
MANIFOLD NEPTUNE II (INSTRUMENTS) ×2 IMPLANT
NS IRRIG 1000ML POUR BTL (IV SOLUTION) ×2 IMPLANT
PACK TOTAL JOINT (CUSTOM PROCEDURE TRAY) ×2 IMPLANT
PAD ARMBOARD 7.5X6 YLW CONV (MISCELLANEOUS) ×2 IMPLANT
PINN SECTOR W/GRIP ACE CUP 56 (Hips) ×2 IMPLANT
PINNACLE ALTRX PLUS 4 N 36X56 (Hips) ×1 IMPLANT
SCREW 6.5MMX30MM (Screw) ×2 IMPLANT
SET HNDPC FAN SPRY TIP SCT (DISPOSABLE) ×1 IMPLANT
STAPLER VISISTAT 35W (STAPLE) IMPLANT
STEM FEMORAL SZ9 HIGH ACTIS (Stem) ×1 IMPLANT
SUT ETHIBOND NAB CT1 #1 30IN (SUTURE) ×2 IMPLANT
SUT MNCRL AB 4-0 PS2 18 (SUTURE) IMPLANT
SUT VIC AB 0 CT1 27 (SUTURE) ×1
SUT VIC AB 0 CT1 27XBRD ANBCTR (SUTURE) ×1 IMPLANT
SUT VIC AB 1 CT1 27 (SUTURE) ×1
SUT VIC AB 1 CT1 27XBRD ANBCTR (SUTURE) ×1 IMPLANT
SUT VIC AB 2-0 CT1 27 (SUTURE) ×1
SUT VIC AB 2-0 CT1 TAPERPNT 27 (SUTURE) ×1 IMPLANT
TOWEL OR 17X24 6PK STRL BLUE (TOWEL DISPOSABLE) ×2 IMPLANT
TOWEL OR 17X26 10 PK STRL BLUE (TOWEL DISPOSABLE) ×2 IMPLANT
TRAY CATH 16FR W/PLASTIC CATH (SET/KITS/TRAYS/PACK) IMPLANT
TRAY FOLEY W/BAG SLVR 16FR (SET/KITS/TRAYS/PACK)
TRAY FOLEY W/BAG SLVR 16FR ST (SET/KITS/TRAYS/PACK) IMPLANT
WATER STERILE IRR 1000ML POUR (IV SOLUTION) ×4 IMPLANT

## 2018-08-17 NOTE — Transfer of Care (Signed)
Immediate Anesthesia Transfer of Care Note  Patient: [name]  Procedure(s) Performed: ANTERIOR LEFT HIP REVISION (Left Hip)  Patient Location: PACU  Anesthesia Type:General  Level of Consciousness: awake and alert   Airway & Oxygen Therapy: Patient Spontanous Breathing and Patient connected to face mask oxygen  Post-op Assessment: Report given to RN and Post -op Vital signs reviewed and stable  Post vital signs: Reviewed and stable  Last Vitals:  Vitals Value Taken Time  BP 147/83 08/17/2018  2:35 PM  Temp    Pulse 88 08/17/2018  2:41 PM  Resp 10 08/17/2018  2:41 PM  SpO2 91 % 08/17/2018  2:41 PM  Vitals shown include unvalidated device data.  Last Pain:  Vitals:   08/17/18 1131  TempSrc:   PainSc: 5       Patients Stated Pain Goal: 2 (01/48/40 3979)  Complications: No apparent anesthesia complications

## 2018-08-17 NOTE — Plan of Care (Signed)
  Problem: Education: Goal: Knowledge of General Education information will improve Description: Including pain rating scale, medication(s)/side effects and non-pharmacologic comfort measures Outcome: Progressing   Problem: Clinical Measurements: Goal: Will remain free from infection Outcome: Progressing   Problem: Activity: Goal: Risk for activity intolerance will decrease Outcome: Progressing   Problem: Skin Integrity: Goal: Risk for impaired skin integrity will decrease Outcome: Progressing   Problem: Safety: Goal: Ability to remain free from injury will improve Outcome: Progressing   

## 2018-08-17 NOTE — Op Note (Signed)
NAME: [name], [name]. MEDICAL RECORD UK:[id number] ACCOUNT [id number] DATE OF BIRTH:1950/06/01 FACILITY: [facility] LOCATION: [facility] PHYSICIAN:Cleophus Mendonsa Kerry Fort, MD  OPERATIVE REPORT  DATE OF PROCEDURE:  08/17/2018  PREOPERATIVE DIAGNOSIS:  Acute failed left total hip arthroplasty.  POSTOPERATIVE DIAGNOSIS:  Acute failed left total hip arthroplasty.  PROCEDURE:  Revision arthroplasty of left total hip with revision of all components.  IMPLANTS:  [employer] Gription acetabular component size 56 with 2 screws, size 36+4 polyethylene liner, size 9 ACTIS high-offset femoral component, size 36+8.5 metal hip ball.  SURGEON:  Lind Guest. Ninfa Linden, MD  ASSISTANT:  Erskine Emery, PA-C  ANESTHESIA:  General.  ANTIBIOTICS:  One gram IV vancomycin.  ESTIMATED BLOOD LOSS:  100 mL.  COMPLICATIONS:  None.  FINDINGS:  Posterior hip instability with the extreme of flexion and internal rotation.  INDICATIONS:  The patient is a 68 year old gentleman well known to me.  He unfortunately developed an acute femoral head collapse with avascular necrosis of left femoral head.  He has a nidus of avascular necrosis in his right hip, but now it has been  asymptomatic.  The left hip had gotten acutely worse, and an MRI and plain films confirmed significance of edema in the femoral head and neck and collapse of the femoral head.  This did warrant a more urgent total hip arthroplasty.  In light of the  COVID-19 pandemic, I did discuss this case with my orthopedic colleagues who did agree that this was more of an indication for an urgent surgery.  Unfortunately, the patient does have severe kyphosis of his lumbar spine and walks very hunched over.  The  spine is fused as well.  I took several measurements of his pelvis trying to assess the inclination of the pelvis, and his pelvis goes unchanged from standing to supine in terms of radiographically.  I felt I compensated for this deformity when I took   him to the operating room for a total hip arthroplasty just 4 days ago.  I was able to place a total hip arthroplasty and actually left him with a longer leg length as well.  Postoperative day 2, he was in a chair in his room when he described flexing  his hip and internally rotating it, trying to get his shoe on from the heel aspect of his shoe and his hip dislocated.  He was able to actually demonstrate that to me with his other hip to show me how he did this.  To me that pointed to more of a  posterior instability.  The hip was reduced on Sunday, and I was able to assess it radiographically.  We did get him up with physical therapy the next day with posterior hip precautions, but he felt uncertain about this, as did both occupational and  physical therapy.  At that point, I felt it was prudent and reasonable to take him to the operating room acutely for revising the components to also get a better idea of what was the mechanism of his dislocation and how was this happening as well.  He  did wish to proceed with surgery, and I had a long and thorough discussion with him about this, including the risk of acute blood loss anemia, infection, implant failure, and recurrent dislocation.  DESCRIPTION OF PROCEDURE:  After informed consent was obtained and appropriate left hip was marked, he was brought to the operating room.  General anesthesia was obtained while he was on a stretcher.  I placed traction boots on both his  feet and next  placed him supine on the Hana fracture table with a perineal post in place and both legs in in-line skeletal traction.  Before we prepped his hip, I took his operative left leg out of the stirrups so I could assess it dynamically under direct  fluoroscopy.  With external rotation, he was stable and did not come out the front, even with extending the leg.  It took me quite amount of flexion of his hip with really flexing him all the way up to his waist and then significantly  internally rotating  to get him to dislocate.  He did dislocate posteriorly, so we felt that that was indicative then of impingement on potentially inferior osteophytes of the pelvis but also needing to antevert the acetabular component more so.  I also felt that he  potentially needed more offset on the stem, which was the femoral side, which we had just a standard offset on the 4.  We then placed him back in the stirrup for the Hana table and prepped his left operative hip with ChloraPrep and sterile drapes.   Time-out was called, and he was identified as correct patient, correct left hip.  We then removed all the staples and opened up the soft tissues superficially so we could irrigate the soft tissue with pulsatile lavage.  We found that to be intact.  We  then went down to the tensor fascia, and that running suture was intact.  We did remove that so we could fully irrigate out the hip joint with pulsatile lavage.  We had reduced it again, so it was located.  It did take some force, but we were able to  externally rotate and dislocate the hip.  We removed the trial hip ball and then were able to remove the femoral component.  After removing the femoral component, then I brought the leg back up and over, and we assessed the acetabular component.  We were  able to remove the polyethylene liner easily, and then I placed the impacter handle and the acetabular component itself and was able to actually loosen this up.  With that being said, we felt like we definitely needed to antivert the acetabular  component more.  The original component was a size 54, so we reamed 1 more millimeter up with a 55 so we could place a 56 acetabular component.  This reaming was done under direct visualization and fluoroscopy, as well as the cup placement under direct  visualization and fluoroscopy.  I did feel like we anteverted him more.  Once we were able to put that cup in,  we did secure it as well with 2 additional screws.   We then put a 36+4 polyethylene liner in place.  We then also removed a large posterior  and inferior osteophyte that may have been impinging on the femoral neck as well.  Attention was then turned back to the femur.  With the leg extended, externally rotated, and adducted, we were able to broach from a size 7 going up to a size 9 on the  ACTIS broaching system from De[employer] for the femoral component.  With the size 9 broach in place, we did place a high offset femoral neck and then a 36-2 hip ball just so we could make sure we could reduce him, which when we brought the leg back over and  up, we reduced it easily.  With that being said, we dislocated it and then removed the trial femoral  component and placed the real ACTIS high-offset femoral stem size 9.  Then we decided based on our medialization of the cup and needing to still go with  the leg length, we went with a +8.5 metal hip ball.  Once we put that on, we were reducing the pelvis, and it really stressed the hip with internal and external rotation and put a lot of stress on the hip, and it was felt to be stable.  I did not take  him out of the stirrup to flex him up, but I really put a lot of stress internally rotated, and with my finger in there and visualization, it did not feel like there was any impingement on any posterior or inferior osteophyte on the femoral neck.  We  then irrigated the soft tissue with normal saline solution after our full fluoroscopic assessment.  We closed the tensor fascia with #1 Vicryl suture, followed by 0 Vicryl in the deep tissue, 2-0 Vicryl used to close the subcutaneous tissue, and  interrupted staples used to close the skin.  We did place an incisional VAC since he was weeping quite a bit before this surgery.  He was awakened, extubated, and taken to recovery room in stable condition.  All final counts were correct.  There were no  complications noted.  Of note, there was an IV infiltration in his left arm during  surgery, so the IV was removed.  I did not feel like there was a compartment syndrome there on our clinical assessment.  A central line was placed by anesthesia.  Of note,  Benita Stabile, PA-C, assisted the entire case.  His assistance was crucial for facilitating every aspect of this case.  LN/NUANCE  D:08/17/2018 T:08/17/2018 JOB:[id number]

## 2018-08-17 NOTE — Progress Notes (Addendum)
PT Cancellation Note  Patient Details Name: [name] MRN: [id number] DOB: [birth year]   Cancelled Treatment:    Reason Eval/Treat Not Completed: (P) Patient at procedure or test/unavailable Pt is off floor for hip revision surgery. PT will follow back later this afternoon as able.    Markell Schrier B. Migdalia Dk PT, DPT Acute Rehabilitation Services Pager [phone] Office [phone]    [place] 08/17/2018, 1:36 PM

## 2018-08-17 NOTE — Progress Notes (Signed)
OT Cancellation Note  Patient Details Name: [name] MRN: [id number] DOB: Sep 01, 1950   Cancelled Treatment:    Reason Eval/Treat Not Completed: Patient at procedure or test/ unavailable(pending surgery today)  Richelle Ito, OTR/L  Acute Rehabilitation Services Pager: [phone] Office: [phone] .  08/17/2018, 8:07 AM

## 2018-08-17 NOTE — Progress Notes (Signed)
Anesthesiology note: 68 year old male who underwent revision of left total hip arthroplasty today under general anesthesia.  Intraoperatively he had bilateral forearm IVs infiltrate during the surgery.   He is status post bilateral radial artery harvest for coronary artery bypass surgery.  In the OR, strong ulnar pulses noted bilaterally.  He is now resting comfortably on 6N and denies any discomfort in the arms.  Left arm is moderately edematous but does not feel tight.  Good bilateral ulnar pulses again noted.  Roberts Gaudy, MD

## 2018-08-17 NOTE — Anesthesia Procedure Notes (Signed)
Central Venous Catheter Insertion Performed by: Roberts Gaudy, MD, anesthesiologist Start/End4/11/2018 2:20 PM, 08/17/2018 2:25 PM Patient location: OR. Preanesthetic checklist: patient identified, IV checked, site marked, risks and benefits discussed, surgical consent, monitors and equipment checked, pre-op evaluation, timeout performed and anesthesia consent Position: Trendelenburg Lidocaine 1% used for infiltration and patient sedated Hand hygiene performed , maximum sterile barriers used  and Seldinger technique used Catheter size: 8 Fr Total catheter length 16. Central line was placed.Double lumen Procedure performed using ultrasound guided technique. Ultrasound Notes:anatomy identified, needle tip was noted to be adjacent to the nerve/plexus identified, no ultrasound evidence of intravascular and/or intraneural injection and image(s) printed for medical record Attempts: 1 Following insertion, dressing applied, line sutured and Biopatch. Post procedure assessment: blood return through all ports  Patient tolerated the procedure well with no immediate complications.

## 2018-08-17 NOTE — Brief Op Note (Signed)
08/17/2018  2:37 PM  PATIENT:  [name]  68 y.o. male  PRE-OPERATIVE DIAGNOSIS:  unstable left hip arthroplasty  POST-OPERATIVE DIAGNOSIS:  unstable left hip arthroplasty  PROCEDURE:  Procedure(s): ANTERIOR LEFT HIP REVISION (Left)  SURGEON:  Surgeon(s) and Role:    Mcarthur Rossetti, MD - Primary  PHYSICIAN ASSISTANT: Benita Stabile, PA-C  ANESTHESIA:   general  EBL:  100 mL   COUNTS:  YES  DICTATION: .Other Dictation: Dictation Number [id number]  PLAN OF CARE: Admit to inpatient   PATIENT DISPOSITION:  PACU - hemodynamically stable.   Delay start of Pharmacological VTE agent (>24hrs) due to surgical blood loss or risk of bleeding: no

## 2018-08-17 NOTE — Progress Notes (Signed)
Patient ID: [name], male   DOB: Oct 16, 1950, 68 y.o.   MRN: [id number] The patient understands fully why we are returning to surgery today to address his left hip instability.  Over the last few days have explained this to him in detail.  We talked about the risk and benefits of surgery including the risks of acute blood loss anemia and infection as well as continued implant failure.  He understands that this is been a difficult situation given his spinal confusion, chronic kyphosis, and a fixed position of his pelvis whether he is standing or supine.  In light of trying to be as meticulous as possible at his initial surgery, he still sustained a dislocation while he was rehabbing in the hospital.  The goal of surgery today is to assess why this is the case and where the impingement and leveraging is coming from so we can reassess component positioning and hopefully better stabilize and aligned the components to keep this from happening.

## 2018-08-17 NOTE — Anesthesia Procedure Notes (Signed)
Procedure Name: Intubation Date/Time: 08/17/2018 12:22 PM Performed by: Bryson Corona, CRNA Pre-anesthesia Checklist: Patient identified, Emergency Drugs available, Suction available and Patient being monitored Patient Re-evaluated:Patient Re-evaluated prior to induction Oxygen Delivery Method: Circle System Utilized Preoxygenation: Pre-oxygenation with 100% oxygen Induction Type: IV induction Laryngoscope Size: Mac and 4 Grade View: Grade I Tube type: Oral Tube size: 7.0 mm Number of attempts: 1 Airway Equipment and Method: Stylet Placement Confirmation: ETT inserted through vocal cords under direct vision,  positive ETCO2 and breath sounds checked- equal and bilateral Secured at: 22 cm Tube secured with: Tape Dental Injury: Teeth and Oropharynx as per pre-operative assessment

## 2018-08-17 NOTE — Plan of Care (Signed)

## 2018-08-18 ENCOUNTER — Encounter (HOSPITAL_COMMUNITY): Payer: Self-pay | Admitting: Orthopaedic Surgery

## 2018-08-18 LAB — CBC
HCT: 31.6 % — ABNORMAL LOW (ref 39.0–52.0)
Hemoglobin: 10 g/dL — ABNORMAL LOW (ref 13.0–17.0)
MCH: 33.7 pg (ref 26.0–34.0)
MCHC: 31.6 g/dL (ref 30.0–36.0)
MCV: 106.4 fL — ABNORMAL HIGH (ref 80.0–100.0)
Platelets: 120 10*3/uL — ABNORMAL LOW (ref 150–400)
RBC: 2.97 MIL/uL — ABNORMAL LOW (ref 4.22–5.81)
RDW: 12.9 % (ref 11.5–15.5)
WBC: 17.1 10*3/uL — ABNORMAL HIGH (ref 4.0–10.5)
nRBC: 0 % (ref 0.0–0.2)

## 2018-08-18 MED ORDER — OXYCODONE HCL 5 MG PO TABS: 5.0000 mg | ORAL_TABLET | ORAL | Status: DC | PRN

## 2018-08-18 MED ORDER — HYDROMORPHONE HCL 2 MG PO TABS
2.0000 mg | ORAL_TABLET | ORAL | Status: DC | PRN
  Administered 2018-08-18 – 2018-08-20 (×6): 2 mg via ORAL
  Filled 2018-08-18 (×6): qty 1

## 2018-08-18 MED ORDER — HYDROMORPHONE HCL 1 MG/ML IJ SOLN: 1.0000 mg | INTRAMUSCULAR | Status: DC | PRN

## 2018-08-18 MED ORDER — ASPIRIN 81 MG PO CHEW
81.0000 mg | CHEWABLE_TABLET | Freq: Two times a day (BID) | ORAL | Status: DC
  Administered 2018-08-18 – 2018-08-20 (×5): 81 mg via ORAL
  Filled 2018-08-18 (×5): qty 1

## 2018-08-18 MED ORDER — BISACODYL 10 MG RE SUPP
10.0000 mg | Freq: Every day | RECTAL | Status: DC | PRN
  Administered 2018-08-19: 10 mg via RECTAL
  Filled 2018-08-18: qty 1

## 2018-08-18 NOTE — Anesthesia Postprocedure Evaluation (Signed)
Anesthesia Post Note  Patient: [name]  Procedure(s) Performed: ANTERIOR LEFT HIP REVISION (Left Hip)     Patient location during evaluation: Nursing Unit Anesthesia Type: General Level of consciousness: awake and alert Pain management: pain level controlled Vital Signs Assessment: post-procedure vital signs reviewed and stable Respiratory status: spontaneous breathing, nonlabored ventilation, respiratory function stable and patient connected to nasal cannula oxygen Cardiovascular status: blood pressure returned to baseline and stable Postop Assessment: no apparent nausea or vomiting Anesthetic complications: no    Last Vitals:  Vitals:   08/18/18 1005 08/18/18 2059  BP: (!) 145/73 137/77  Pulse: 88 80  Resp: 18 17  Temp: 36.9 C 36.8 C  SpO2: 99% 96%    Last Pain:  Vitals:   08/18/18 2059  TempSrc: Oral  PainSc:                  Juvon Teater COKER

## 2018-08-18 NOTE — Plan of Care (Signed)

## 2018-08-18 NOTE — Progress Notes (Signed)
Occupational Therapy Treatment Patient Details Name: [name] MRN: [id number] DOB: 02/25/1951 Today's Date: 08/18/2018    History of present illness 68 y.o. male, has a history of pain and functional disability in the left hip(s) due to avascular necrosis with femoral head collapse. s/p Left total hip arthroplasty through direct anterior approach 08/13/2018 08/15/18 Pt self dislocated L hip attempting to put on shoes to go home. Returned to OR for relocation of hip. s/p L hip arthroplasty repair 08/17/18. PMH: CAD, RA, recurrent DVT ; multiple back surgeries   OT comments  Pt s/p R THA with posterior hip precautions with poor recall of precautions. Pt needs (A) to recall precautions with functional task.   Follow Up Recommendations  No OT follow up;Supervision/Assistance - 24 hour    Equipment Recommendations       Recommendations for Other Services      Precautions / Restrictions Precautions Precautions: Posterior Hip Precaution Booklet Issued: Yes (comment) Precaution Comments: Posterior hip strict precautions L LE Required Braces or Orthoses: Knee Immobilizer - Left Knee Immobilizer - Left: On at all times(to reduce risk of dislocation ) Restrictions Weight Bearing Restrictions: Yes LLE Weight Bearing: Weight bearing as tolerated       Mobility Bed Mobility Overal bed mobility: Needs Assistance Bed Mobility: Supine to Sit     Supine to sit: Min guard     General bed mobility comments: able to come to eob with UE Help L LE and educated on leg lift. pt able to bridge with R LE  Transfers Overall transfer level: Needs assistance Equipment used: Rolling walker (2 wheeled) Transfers: Sit to/from Stand Sit to Stand: Min guard;From elevated surface         General transfer comment: min guard for safety, increased time and effort to get L LE underneath him, increased vc for maintence of precautions, able to perform sit>stand from recliner as well, vc for keeping L LE  far out in front of him for power up    Balance Overall balance assessment: Needs assistance Sitting-balance support: Feet supported;No upper extremity supported Sitting balance-[name]: Good     Standing balance support: Bilateral upper extremity supported Standing balance-[name]: Poor                             ADL either performed or assessed with clinical judgement   ADL Overall ADL's : Needs assistance/impaired Eating/Feeding: Independent;Sitting           Lower Body Bathing: Maximal assistance Lower Body Bathing Details (indicate cue type and reason): clean wash cloth and washing around wound       Lower Body Dressing Details (indicate cue type and reason): educated on dressing the L LE first and pt poor awareness to sequence               General ADL Comments: no recall of precautions at this time; bed level due to return to bed     Vision       Perception     Praxis      Cognition Arousal/Alertness: Awake/alert Behavior During Therapy: WFL for tasks assessed/performed Overall Cognitive Status: Within Functional Limits for tasks assessed                                          Exercises     Shoulder  Instructions       General Comments ice applied to wound    Pertinent Vitals/ Pain       Pain Assessment: 0-10 Pain Score: 7  Pain Location: L hip  Pain Descriptors / Indicators: Aching;Sore;Throbbing Pain Intervention(s): Monitored during session;Premedicated before session;Ice applied  Home Living                                          Prior Functioning/Environment              Frequency           Progress Toward Goals  OT Goals(current goals can now be found in the care plan section)  Progress towards OT goals: Progressing toward goals  Acute Rehab OT Goals Patient Stated Goal: have less pain with walking OT Goal Formulation: All assessment and education complete,  DC therapy  Plan Discharge plan remains appropriate    Co-evaluation                 AM-PAC OT "6 Clicks" Daily Activity     Outcome Measure   Help from another person eating meals?: None Help from another person taking care of personal grooming?: None Help from another person toileting, which includes using toliet, bedpan, or urinal?: None Help from another person bathing (including washing, rinsing, drying)?: A Little Help from another person to put on and taking off regular upper body clothing?: None Help from another person to put on and taking off regular lower body clothing?: A Lot 6 Click Score: 21    End of Session    OT Visit Diagnosis: Unsteadiness on feet (R26.81);Pain Pain - Right/Left: Left Pain - part of body: Hip   Activity Tolerance Patient tolerated treatment well   Patient Left in bed;with call bell/phone within reach;with bed alarm set   Nurse Communication Mobility status        Time: 1450-1506 OT Time Calculation (min): 16 min  Charges: OT General Charges $OT Visit: 1 Visit OT Treatments $Therapeutic Activity: 8-22 mins   Jeri Modena, OTR/L  Acute Rehabilitation Services Pager: [phone] Office: [phone] .    Jeri Modena 08/18/2018, 3:41 PM

## 2018-08-18 NOTE — Progress Notes (Signed)
Patient ID: [name], male   DOB: [birth year], 68 y.o.   MRN: [id number] The patient did well with surgery yesterday.  He is awake and alert this morning.  He does have some hip soreness.  I spent a while with him discussing my findings and her operative.  I believe when he sits his pelvis certainly seems to be in a more posterior retroverted position.  I was able to antivert the acetabular component more and then place a high offset femoral component and lengthen him with the longer hip ball.  Hopefully this will decrease his ability to leverage his hip out.  I also found some posterior and inferior osteophytes around the acetabulum that I removed.  He does have an incisional VAC over closed incision to help dry the wound.  I will not resume his normal blood thinning medication until he is home.  I will put him on a baby aspirin twice a day while he is here.  He can be up with physical therapy with weightbearing as tolerated but still strict posterior hip precautions.

## 2018-08-18 NOTE — Progress Notes (Signed)
Physical Therapy Treatment Patient Details Name: [name] MRN: [id number] DOB: 10-10-1950 Today's Date: 08/18/2018    History of Present Illness 68 y.o. male, has a history of pain and functional disability in the left hip(s) due to avascular necrosis with femoral head collapse. s/p Left total hip arthroplasty through direct anterior approach 08/13/2018 08/15/18 Pt self dislocated L hip attempting to put on shoes to go home. Returned to OR for relocation of hip. s/p L hip arthroplasty repair 08/17/18. PMH: CAD, RA, recurrent DVT ; multiple back surgeries    PT Comments    Pt eager to work with therapy, but looks visibly fatigued. Pt able to recall 3/3 posterior hip precautions on entry. Pt is min guard for mobility, transfers and ambulation of 5 feet with RW. Discussed d/c options, pt states he is more painful and more fatigued with mobility today, and would consider SNF, however states that his daughter could be there 24/7 to help him and that he has wheelchair and a motorized scooter if needed. Encouraged pt to get some rest and be sure to eat and we will revisit d/c plans tomorrow as he reports MD is projecting d/c on Friday.     Follow Up Recommendations  Follow surgeon's recommendation for DC plan and follow-up therapies     Equipment Recommendations  None recommended by PT       Precautions / Restrictions Precautions Precautions: Posterior Hip Precaution Booklet Issued: Yes (comment) Precaution Comments: dislocation of L hip with getting on shoes for d/c  Required Braces or Orthoses: Knee Immobilizer - Left Knee Immobilizer - Left: On at all times(to reduce risk of dislocation ) Restrictions Weight Bearing Restrictions: Yes LLE Weight Bearing: Weight bearing as tolerated    Mobility  Bed Mobility Overal bed mobility: Needs Assistance Bed Mobility: Supine to Sit     Supine to sit: Min guard     General bed mobility comments: pt able to come to EoB with min guard,  increased time and effort due to brace drag on cotton bed sheet, pt requested no outside help and was able to complete on his own with good adherence to posterior hip precaution  Transfers Overall transfer level: Needs assistance Equipment used: Rolling walker (2 wheeled) Transfers: Sit to/from Stand Sit to Stand: Min guard;From elevated surface         General transfer comment: min guard for safety, increased time and effort to get L LE underneath him, increased vc for maintence of precautions, able to perform sit>stand from recliner as well, vc for keeping L LE far out in front of him for power up  Ambulation/Gait Ambulation/Gait assistance: Min guard Gait Distance (Feet): 5 Feet Assistive device: Rolling walker (2 wheeled) Gait Pattern/deviations: Antalgic;Decreased weight shift to left;Decreased stance time - left;Decreased step length - right;Step-to pattern Gait velocity: slowed Gait velocity interpretation: <1.31 ft/sec, indicative of household ambulator General Gait Details: hands on min guard for safety, vc for more upright posture if possible       Balance Overall balance assessment: Needs assistance Sitting-balance support: Feet supported;No upper extremity supported Sitting balance-[name]: Good     Standing balance support: Bilateral upper extremity supported Standing balance-[name]: Poor                              Cognition Arousal/Alertness: Awake/alert Behavior During Therapy: WFL for tasks assessed/performed Overall Cognitive Status: Within Functional Limits for tasks assessed  General Comments General comments (skin integrity, edema, etc.): Wound vac in place with minor drainage, pt with c/o of sore sacral region from lying flat  on standing pt with redness and area of increased moisture, RN notified and sacral foam placed. [name] ordered for when pt is up in chair       Pertinent Vitals/Pain Pain Assessment: 0-10 Pain Score: 7  Pain Location: L hip  Pain Descriptors / Indicators: Aching;Sore;Throbbing Pain Intervention(s): Limited activity within patient's tolerance;Monitored during session;Repositioned;RN gave pain meds during session           PT Goals (current goals can now be found in the care plan section) Acute Rehab PT Goals Patient Stated Goal: have less pain with walking PT Goal Formulation: With patient Time For Goal Achievement: 08/27/18 Potential to Achieve Goals: Fair    Frequency    Min 5X/week      PT Plan Current plan remains appropriate       AM-PAC PT "6 Clicks" Mobility   Outcome Measure  Help needed turning from your back to your side while in a flat bed without using bedrails?: A Little Help needed moving from lying on your back to sitting on the side of a flat bed without using bedrails?: A Little Help needed moving to and from a bed to a chair (including a wheelchair)?: A Little Help needed standing up from a chair using your arms (e.g., wheelchair or bedside chair)?: A Little Help needed to walk in hospital room?: A Little Help needed climbing 3-5 steps with a railing? : A Lot 6 Click Score: 17    End of Session Equipment Utilized During Treatment: Gait belt Activity Tolerance: Patient limited by pain Patient left: in chair;with call bell/phone within reach;with chair alarm set Nurse Communication: Mobility status;Precautions;Weight bearing status(request for laxitive due to constipation) PT Visit Diagnosis: Unsteadiness on feet (R26.81);Other abnormalities of gait and mobility (R26.89);Muscle weakness (generalized) (M62.81);Difficulty in walking, not elsewhere classified (R26.2);Pain Pain - Right/Left: Left Pain - part of body: Hip     Time: 3354-5625 PT Time Calculation (min) (ACUTE ONLY): 25 min  Charges:  $Gait Training: 8-22 mins $Therapeutic Activity: 8-22 mins                     [name]  B. Migdalia Dk PT, DPT Acute Rehabilitation Services Pager [phone] Office [phone]    [place] 08/18/2018, 1:13 PM

## 2018-08-18 NOTE — Anesthesia Preprocedure Evaluation (Signed)
Anesthesia Evaluation  Patient identified by MRN, date of birth, ID band Patient awake    Reviewed: Allergy & Precautions, NPO status , Patient's Chart, lab work & pertinent test results  Airway Mallampati: II  TM Distance: >3 FB Neck ROM: Full    Dental  (+) Teeth Intact, Dental Advisory Given   Pulmonary    breath sounds clear to auscultation       Cardiovascular hypertension,  Rhythm:Regular Rate:Normal     Neuro/Psych    GI/Hepatic   Endo/Other    Renal/GU      Musculoskeletal   Abdominal   Peds  Hematology   Anesthesia Other Findings   Reproductive/Obstetrics                             Anesthesia Physical Anesthesia Plan  ASA: III  Anesthesia Plan: General   Post-op Pain Management:    Induction: Intravenous  PONV Risk Score and Plan: Ondansetron and Dexamethasone  Airway Management Planned: Oral ETT  Additional Equipment:   Intra-op Plan:   Post-operative Plan: Extubation in OR  Informed Consent: I have reviewed the patients History and Physical, chart, labs and discussed the procedure including the risks, benefits and alternatives for the proposed anesthesia with the patient or authorized representative who has indicated his/her understanding and acceptance.   Dental advisory given  Plan Discussed with: CRNA and Anesthesiologist  Anesthesia Plan Comments:         Anesthesia Quick Evaluation  

## 2018-08-19 MED ORDER — HYDROMORPHONE HCL 2 MG PO TABS: 2.0000 mg | ORAL_TABLET | Freq: Four times a day (QID) | ORAL | 0 refills | Status: DC | PRN

## 2018-08-19 NOTE — Progress Notes (Signed)
Physical Therapy Treatment Patient Details Name: [name] MRN: [id number] DOB: [birth year] Today's Date: 08/19/2018    History of Present Illness 68 y.o. male, has a history of pain and functional disability in the left hip(s) due to avascular necrosis with femoral head collapse. s/p Left total hip arthroplasty through direct anterior approach 08/13/2018 08/15/18 Pt self dislocated L hip attempting to put on shoes to go home. Returned to OR for relocation of hip. s/p L hip arthroplasty repair 08/17/18. PMH: CAD, RA, recurrent DVT ; multiple back surgeries    PT Comments    Pt desires to go home after hospital and willing to push himself with ambulation today. Pain continues to be pts limiting factor in increased mobility. Pt currently supervision for bed mobility and min guard for transfers and ambulation of 30 feet with RW. Pt making good progress and will have daughter to assist at home, PT recommending d/c home. PT to practice stairs tomorrow before going home.     Follow Up Recommendations  Follow surgeon's recommendation for DC plan and follow-up therapies     Equipment Recommendations  None recommended by PT       Precautions / Restrictions Precautions Precautions: Posterior Hip Precaution Booklet Issued: Yes (comment) Precaution Comments: dislocation of L hip with getting on shoes for d/c  Required Braces or Orthoses: Knee Immobilizer - Left Knee Immobilizer - Left: On at all times(to reduce risk of dislocation ) Restrictions Weight Bearing Restrictions: Yes LLE Weight Bearing: Weight bearing as tolerated    Mobility  Bed Mobility Overal bed mobility: Needs Assistance Bed Mobility: Supine to Sit     Supine to sit: Supervision     General bed mobility comments: supervision for safety, able to bridge without cuing to bring L LE to EoB  Transfers Overall transfer level: Needs assistance Equipment used: Rolling walker (2 wheeled) Transfers: Sit to/from Stand Sit to  Stand: Min guard;From elevated surface         General transfer comment: min guard for safety, extra effort to increase trunk upright to maintain hip precautions  Ambulation/Gait Ambulation/Gait assistance: Min guard Gait Distance (Feet): 30 Feet Assistive device: Rolling walker (2 wheeled) Gait Pattern/deviations: Antalgic;Decreased weight shift to left;Decreased stance time - left;Decreased step length - right;Step-to pattern Gait velocity: slowed Gait velocity interpretation: <1.8 ft/sec, indicate of risk for recurrent falls General Gait Details: hands on min guard for safety, continue to cue for upright posture as pt with increased flexion as ambulation progressed       Balance Overall balance assessment: Needs assistance Sitting-balance support: Feet supported;No upper extremity supported Sitting balance-[name]: Good     Standing balance support: Bilateral upper extremity supported Standing balance-[name]: Poor                              Cognition Arousal/Alertness: Awake/alert Behavior During Therapy: WFL for tasks assessed/performed Overall Cognitive Status: Within Functional Limits for tasks assessed                                           General Comments General comments (skin integrity, edema, etc.): surgical site continues with erythema and ecchymosis       Pertinent Vitals/Pain Pain Assessment: 0-10 Pain Score: 7  Pain Location: L hip  Pain Descriptors / Indicators: Aching;Sore;Throbbing Pain Intervention(s): Limited activity within patient's  tolerance;Monitored during session;Repositioned           PT Goals (current goals can now be found in the care plan section) Acute Rehab PT Goals Patient Stated Goal: have less pain with walking PT Goal Formulation: With patient Time For Goal Achievement: 08/27/18 Potential to Achieve Goals: Fair Progress towards PT goals: Progressing toward goals     Frequency    Min 5X/week      PT Plan Current plan remains appropriate       AM-PAC PT "6 Clicks" Mobility   Outcome Measure  Help needed turning from your back to your side while in a flat bed without using bedrails?: A Little Help needed moving from lying on your back to sitting on the side of a flat bed without using bedrails?: A Little Help needed moving to and from a bed to a chair (including a wheelchair)?: A Little Help needed standing up from a chair using your arms (e.g., wheelchair or bedside chair)?: A Little Help needed to walk in hospital room?: A Little Help needed climbing 3-5 steps with a railing? : A Lot 6 Click Score: 17    End of Session Equipment Utilized During Treatment: Gait belt Activity Tolerance: Patient limited by pain Patient left: in chair;with call bell/phone within reach;with chair alarm set Nurse Communication: Mobility status;Precautions;Weight bearing status(request for laxitive due to constipation) PT Visit Diagnosis: Unsteadiness on feet (R26.81);Other abnormalities of gait and mobility (R26.89);Muscle weakness (generalized) (M62.81);Difficulty in walking, not elsewhere classified (R26.2);Pain Pain - Right/Left: Left Pain - part of body: Hip     Time: 4967-5916 PT Time Calculation (min) (ACUTE ONLY): 23 min  Charges:  $Gait Training: 8-22 mins $Therapeutic Activity: 8-22 mins                     [name] B. Migdalia Dk PT, DPT Acute Rehabilitation Services Pager [phone] Office [phone]    [name] 08/19/2018, 12:43 PM

## 2018-08-19 NOTE — Progress Notes (Signed)
Patient ID: ERI PLATTEN, male   DOB: Sep 15, 1950, 68 y.o.   MRN: [id number] The patient looks much better and feels much better overall.  He did make some more progress with physical therapy today.  I did remove the incisional VAC over his left hip incision and his incision looks good.  I placed an Aquacel dressing over the incision.  I anticipate him being able to be discharged to home with home health therapy tomorrow afternoon.

## 2018-08-19 NOTE — Care Management Important Message (Signed)
Important Message  Patient Details  Name: [name] MRN: [id number] Date of Birth: Jan 14, 1951   Medicare Important Message Given:  Yes    [name] 08/19/2018, 4:23 PM

## 2018-08-19 NOTE — Progress Notes (Signed)
Occupational Therapy Treatment Patient Details Name: [name] MRN: [id number] DOB: 30-Sep-1950 Today's Date: 08/19/2018    History of present illness 68 y.o. male, has a history of pain and functional disability in the left hip(s) due to avascular necrosis with femoral head collapse. s/p Left total hip arthroplasty through direct anterior approach 08/13/2018 08/15/18 Pt self dislocated L hip attempting to put on shoes to go home. Returned to OR for relocation of hip. s/p L hip arthroplasty repair 08/17/18. PMH: CAD, RA, recurrent DVT ; multiple back surgeries   OT comments  Pt educated and provided AE for LB ADL and instructed in shower transfer with RW. Pt demonstrated understanding of all.   Follow Up Recommendations  Follow surgeon's recommendation for DC plan and follow-up therapies    Equipment Recommendations  3 in 1 bedside commode    Recommendations for Other Services      Precautions / Restrictions Precautions Precautions: Posterior Hip Precaution Booklet Issued: Yes (comment) Precaution Comments: educated in hip precautions related to LB bathing and dressing Required Braces or Orthoses: Knee Immobilizer - Left Knee Immobilizer - Left: On at all times(to reduce risk of dislocation) Restrictions Weight Bearing Restrictions: Yes LLE Weight Bearing: Weight bearing as tolerated       Mobility Bed Mobility Overal bed mobility: Needs Assistance Bed Mobility: Supine to Sit;Sit to Supine     Supine to sit: Supervision Sit to supine: Min assist   General bed mobility comments: assisted L LE back into bed  Transfers Overall transfer level: Needs assistance Equipment used: Rolling walker (2 wheeled) Transfers: Sit to/from Stand Sit to Stand: Min guard;From elevated surface         General transfer comment: min guard for safety, extra effort to increase trunk upright to maintain hip precautions    Balance Overall balance assessment: Needs assistance Sitting-balance  support: Feet supported;No upper extremity supported Sitting balance-[name]: Good     Standing balance support: Bilateral upper extremity supported Standing balance-[name]: Poor                             ADL either performed or assessed with clinical judgement   ADL Overall ADL's : Needs assistance/impaired               Lower Body Bathing Details (indicate cue type and reason): educated and provided long handle bath sponge Upper Body Dressing : Set up;Sitting Upper Body Dressing Details (indicate cue type and reason): front opening gown Lower Body Dressing: Minimal assistance;Sit to/from stand Lower Body Dressing Details (indicate cue type and reason): educated and provided reacher, sock aid, and long handled shoe horn, recommended elastic shoe laces vs slip on shoes           Tub/Shower Transfer Details (indicate cue type and reason): instructed in shower transfer with RW         Vision       Perception     Praxis      Cognition Arousal/Alertness: Awake/alert Behavior During Therapy: WFL for tasks assessed/performed Overall Cognitive Status: Within Functional Limits for tasks assessed                                          Exercises     Shoulder Instructions       General Comments surgical site continues with erythema and  ecchymosis     Pertinent Vitals/ Pain       Pain Assessment: Faces Pain Score: 7  Faces Pain Scale: Hurts even more Pain Location: L hip  Pain Descriptors / Indicators: Aching;Sore;Throbbing Pain Intervention(s): Monitored during session  Home Living                                          Prior Functioning/Environment              Frequency  Min 2X/week        Progress Toward Goals  OT Goals(current goals can now be found in the care plan section)  Progress towards OT goals: Progressing toward goals  Acute Rehab OT Goals Patient Stated Goal: have  less pain with walking  Plan Discharge plan remains appropriate    Co-evaluation                 AM-PAC OT "6 Clicks" Daily Activity     Outcome Measure   Help from another person eating meals?: None Help from another person taking care of personal grooming?: A Little Help from another person toileting, which includes using toliet, bedpan, or urinal?: A Little Help from another person bathing (including washing, rinsing, drying)?: A Little Help from another person to put on and taking off regular upper body clothing?: None Help from another person to put on and taking off regular lower body clothing?: A Little 6 Click Score: 20    End of Session Equipment Utilized During Treatment: Rolling walker;Gait belt;Left knee immobilizer  OT Visit Diagnosis: Unsteadiness on feet (R26.81);Pain Pain - Right/Left: Left Pain - part of body: Hip   Activity Tolerance Patient tolerated treatment well   Patient Left in bed;with call bell/phone within reach;with bed alarm set   Nurse Communication          Time: 0165-5374 OT Time Calculation (min): 27 min  Charges: OT General Charges $OT Visit: 1 Visit OT Treatments $Self Care/Home Management : 23-37 mins  Nestor Lewandowsky, OTR/L Acute Rehabilitation Services Pager: [phone] Office: [phone]   Malka So 08/19/2018, 1:50 PM

## 2018-08-20 ENCOUNTER — Telehealth (INDEPENDENT_AMBULATORY_CARE_PROVIDER_SITE_OTHER): Payer: Self-pay | Admitting: *Deleted

## 2018-08-20 ENCOUNTER — Encounter (INDEPENDENT_AMBULATORY_CARE_PROVIDER_SITE_OTHER): Payer: Self-pay | Admitting: Orthopaedic Surgery

## 2018-08-20 NOTE — Discharge Summary (Signed)
Patient ID: [name] MRN: [id number] DOB/AGE: [birth year] 70 y.o.  Admit date: 08/13/2018 Discharge date: 08/20/2018  Admission Diagnoses:  Principal Problem:   Avascular necrosis of bone of hip, left ([place]) Active Problems:   Status post total replacement of left hip   Discharge Diagnoses:  Same  Past Medical History:  Diagnosis Date  . Allergy   . Anxiety   . Carotid artery occlusion    40-60% right ICA stenosis (09/2008)  . Cataract   . Chronic back pain   . CLL (chronic lymphoblastic leukemia) dx 2010   Followed at [facility] q21mo, no current therapy   . Clotting disorder ([place])   . Coronary artery disease 2010   s/p CABG '10, sees Dr. Percival Spanish  . Depression   . Diverticulosis   . DVT, lower extremity, recurrent ([place]) 2008, 2009   LLE, chronic anticoag since 2009  . Esophagitis   . Fibromyalgia   . Gallstones   . GERD (gastroesophageal reflux disease)   . Gout   . Gynecomastia, male   . H/O hiatal hernia 2008   surgery  . Hemorrhoids   . Hepatitis A yrs ago  . HIV infection ([place]) dx 1993  . Hypertension   . Impotence of organic origin   . Myocardial infarction ([place]) 2010    x 2  . Neuromuscular disorder ([facility])    neuropathy  . Osteoarthritis, knee    s/p B TKA  . Osteoporosis   . Pneumonia mrach, may, july 2017  . Rheumatoid arthritis(714.0) dx 2010   MTX, follows with rheum  . Seasonal allergies   . Secondary syphilis 07/24/14 dx   s/p 2 wks doxy  . Status post dilation of esophageal narrowing   . Stroke ([place]) [place]   . TIA (transient ischemic attack) 1997   mild residual L mouth droop  . Tubular adenoma of colon     Surgeries: Procedure(s): ANTERIOR LEFT HIP REVISION on 08/17/2018   Consultants:   Discharged Condition: Improved  Hospital Course: [name] is an 68 y.o. male who was admitted 08/13/2018 for operative treatment ofAvascular necrosis of bone of hip, left ([place]). Patient has severe unremitting pain that affects sleep, daily  activities, and work/hobbies. After pre-op clearance the patient was taken to the operating room on 08/17/2018 and underwent  Procedure(s): ANTERIOR LEFT HIP REVISION.    Patient was given perioperative antibiotics:  Anti-infectives (From admission, onward)   Start     Dose/Rate Route Frequency Ordered Stop   08/17/18 1151  vancomycin (VANCOCIN) 1-5 GM/200ML-% IVPB    Note to Pharmacy:  [name]   : cabinet override      08/17/18 1151 08/17/18 2359   08/14/18 0800  elvitegravir-cobicistat-emtricitabine-tenofovir (GENVOYA) 150-150-200-10 MG tablet 1 tablet     1 tablet Oral Daily with breakfast 08/13/18 1056     08/13/18 1130  darunavir (PREZISTA) tablet 800 mg    Note to Pharmacy:  TAKE 1 TABLET (800 MG TOTAL) BY MOUTH DAILY.     800 mg Oral Daily 08/13/18 1056     08/13/18 1100  valACYclovir (VALTREX) tablet 500 mg     500 mg Oral Daily 08/13/18 1056     08/13/18 1100  clindamycin (CLEOCIN) IVPB 600 mg     600 mg 100 mL/hr over 30 Minutes Intravenous Every 6 hours 08/13/18 1056 08/13/18 1904   08/13/18 0600  clindamycin (CLEOCIN) IVPB 900 mg     900 mg 100 mL/hr over 30 Minutes Intravenous On  call to O.R. 08/13/18 1194 08/13/18 1740   08/13/18 0555  clindamycin (CLEOCIN) 900 MG/50ML IVPB    Note to Pharmacy:  [name]   : cabinet override      08/13/18 0555 08/13/18 0752       Patient was given sequential compression devices, early ambulation, and chemoprophylaxis to prevent DVT.  His postoperative course was complicated by a posterior hip dislocation on postoperative day 2 while he was in the hospital.  He had flexed his hip up significantly and internally rotated as well as pushed against his knee to try to get a shoe on and he sustained a posterior hip dislocation.  I felt this was also related to his pelvis being fused to the spine and it in more of a posterior orientation when he sits.  At that point I felt it was more prudent to take him back to the operating room and  revise the components to allow me to antivert the acetabular component more and to give him more offset and leg length and tighten tissues.  He tolerated that surgery well.  We have him on strict posterior hip precautions and he did well with therapy.  The remainder of his hospitalization was unremarkable.  Recent vital signs:  Patient Vitals for the past 24 hrs:  BP Temp Temp src Pulse Resp SpO2  08/20/18 0433 122/75 98.4 F (36.9 C) Oral 79 16 96 %  08/19/18 2102 126/66 97.9 F (36.6 C) Oral 81 19 96 %  08/19/18 1524 111/67 97.9 F (36.6 C) Oral 87 18 92 %     Recent laboratory studies:  Recent Labs    08/18/18 0933  WBC 17.1*  HGB 10.0*  HCT 31.6*  PLT 120*     Discharge Medications:   Allergies as of 08/20/2018      Reactions   Golimumab Anaphylaxis   simponi [name]   Orencia [abatacept] Anaphylaxis   Other Anaphylaxis, Hives   Pecan   Peanut-containing Drug Products Anaphylaxis, Hives, Swelling   Swelling of throat   Morphine Other (See Comments)   REACTION: severe headache   Oxycodone-acetaminophen Other (See Comments)   REACTION: headache   Penicillins Rash, Other (See Comments)   FLUSHED, RED Has patient had a PCN reaction causing immediate rash, facial/tongue/throat swelling, SOB or lightheadedness with hypotension: #  #  #  YES  #  #  #  Has patient had a PCN reaction causing severe rash involving mucus membranes or skin necrosis: No Has patient had a PCN reaction that required hospitalization No Has patient had a PCN reaction occurring within the last 10 years: No If all of the above answers are "NO", then may proceed with Cephalosporin use   Promethazine Hcl Other (See Comments)   REACTION: makes him feel drunk at higher strengths      Medication List    STOP taking these medications   acetaminophen-codeine 300-30 MG tablet Commonly known as:  TYLENOL #3   HYDROcodone-acetaminophen 5-325 MG tablet Commonly known as:  NORCO/VICODIN     TAKE these  medications   atorvastatin 10 MG tablet Commonly known as:  LIPITOR Take 1 tablet (10 mg total) by mouth at bedtime.   clindamycin 150 MG capsule Commonly known as:  CLEOCIN Take 600 mg by mouth See admin instructions. Take 4 capsules (600 mg) by mouth 1 hour prior to dental appointments.   darunavir 800 MG tablet Commonly known as:  Prezista TAKE 1 TABLET (800 MG TOTAL) BY MOUTH DAILY.  elvitegravir-cobicistat-emtricitabine-tenofovir 150-150-200-10 MG Tabs tablet Commonly known as:  Genvoya Take 1 tablet by mouth daily with breakfast.   escitalopram 20 MG tablet Commonly known as:  LEXAPRO Take 20 mg by mouth daily.   famotidine 40 MG tablet Commonly known as:  PEPCID TAKE 1 TABLET BY MOUTH TWICE DAILY FOR 2 MONTHS What changed:  See the new instructions.   folic acid 1 MG tablet Commonly known as:  FOLVITE Take 1 mg by mouth daily.   furosemide 20 MG tablet Commonly known as:  LASIX Take 1 tablet (20 mg total) by mouth 2 (two) times daily.   HYDROmorphone 2 MG tablet Commonly known as:  DILAUDID Take 1 tablet (2 mg total) by mouth every 6 (six) hours as needed for severe pain.   leflunomide 20 MG tablet Commonly known as:  ARAVA Take 20 mg by mouth daily.   nitroGLYCERIN 0.4 MG SL tablet Commonly known as:  NITROSTAT Place 1 tablet (0.4 mg total) under the tongue every 5 (five) minutes as needed for chest pain.   predniSONE 5 MG tablet Commonly known as:  DELTASONE Take 5 mg by mouth 2 (two) times daily.   pregabalin 200 MG capsule Commonly known as:  LYRICA TAKE 1 CAPSULE BY MOUTH TWICE A DAY   Rexulti 0.25 MG Tabs Generic drug:  Brexpiprazole Take 0.25 mg by mouth daily.   Savaysa 30 MG Tabs tablet Generic drug:  edoxaban TAKE 1 TABLET (30 MG TOTAL) BY MOUTH DAILY.   sulfamethoxazole-trimethoprim 800-160 MG tablet Commonly known as:  BACTRIM DS,SEPTRA DS TAKE 1 TABLET BY MOUTH EVERY 12 (TWELVE) HOURS.   valACYclovir 500 MG tablet Commonly known  as:  VALTREX Take 1 tablet (500 mg total) by mouth daily.            Durable Medical Equipment  (From admission, onward)         Start     Ordered   08/13/18 1057  DME Walker rolling  Once    Question:  Patient needs a walker to treat with the following condition  Answer:  Status post total replacement of left hip   08/13/18 1056   08/13/18 1057  DME 3 n 1  Once     08/13/18 1056           Discharge Care Instructions  (From admission, onward)         Start     Ordered   08/15/18 0000  Weight bearing as tolerated    Question Answer Comment  Laterality bilateral   Extremity Lower      08/15/18 0843          Diagnostic Studies: Dg Pelvis Portable  Result Date: 08/17/2018 CLINICAL DATA:  Status post total left hip replacement. EXAM: PORTABLE PELVIS 1-2 VIEWS COMPARISON:  Operative images obtained earlier today. FINDINGS: Single view shows the left hip total arthroplasty to be well seated and aligned. No evidence of a new fracture or operative complication. IMPRESSION: Well-positioned total left hip arthroplasty. Electronically Signed   By: Lajean Manes M.D.   On: 08/17/2018 15:12   Dg Pelvis Portable  Result Date: 08/15/2018 CLINICAL DATA:  Closed reduction of left hip earlier today. EXAM: PORTABLE PELVIS 1-2 VIEWS COMPARISON:  08/13/2018; 08/09/2018 FINDINGS: Post left total hip replacement. Alignment appears anatomic given AP projection. No definite fracture or dislocation. No evidence of hardware failure or loosening. Skin staples are noted about the superolateral aspect of the left hip. Expected adjacent subcutaneous emphysema. No radiopaque foreign body.  Limited visualization of the pelvis and contralateral right hip suggests moderate degenerative change of the right hip with joint space loss, subchondral sclerosis and osteophytosis. IMPRESSION: Post left total hip replacement without evidence of complication. Electronically Signed   By: Sandi Mariscal M.D.   On:  08/15/2018 14:12   Dg Pelvis Portable  Result Date: 08/13/2018 CLINICAL DATA:  Status post left total hip replacement. EXAM: PORTABLE PELVIS 1-2 VIEWS COMPARISON:  Intraoperative radiographs dated 08/13/2018 and MRI dated 06/03/2018 FINDINGS: The components of the left total hip prosthesis appear in excellent position in the AP projection. Postsurgical air in the soft tissues around the left hip. The visualized pelvic bones are normal. Previous lower lumbar fusion. IMPRESSION: Satisfactory appearance of the pelvis in the AP projection after left total hip prosthesis insertion. Electronically Signed   By: Lorriane Shire M.D.   On: 08/13/2018 10:49   Dg Chest Port 1 View  Result Date: 08/17/2018 CLINICAL DATA:  Central line insertion. EXAM: PORTABLE CHEST 1 VIEW COMPARISON:  05/20/2016 FINDINGS: Left internal jugular central venous line has its catheter tip in the lower superior vena cava. No pneumothorax. Cardiac silhouette is normal in size. Stable changes from prior CABG surgery. No mediastinal or hilar masses. There are linear opacities in both lung bases, greater on the left, similar to the prior study, consistent with scarring, atelectasis or a combination. Small stable granuloma in the right upper lobe. Lungs otherwise clear. Skeletal structures are grossly intact. IMPRESSION: 1. Left internal jugular central venous catheter tip projects in the lower superior vena cava. No pneumothorax. 2. No acute cardiopulmonary disease. Electronically Signed   By: Lajean Manes M.D.   On: 08/17/2018 15:08   Dg C-arm 1-60 Min  Result Date: 08/17/2018 CLINICAL DATA:  Left total hip revision. EXAM: DG C-ARM 61-120 MIN; OPERATIVE LEFT HIP WITH PELVIS COMPARISON:  Radiograph dated 08/15/2018 FINDINGS: AP views of the left hip demonstrate that the acetabular and femoral components appear in good position in the AP projection. No fractures. IMPRESSION: Revision of left total hip prosthesis performed. Components appear in  good position in the AP projection. FLUOROSCOPY TIME:  59 seconds C-arm fluoroscopic images were obtained intraoperatively and submitted for post operative interpretation. Electronically Signed   By: Lorriane Shire M.D.   On: 08/17/2018 14:07   Dg C-arm 1-60 Min  Result Date: 08/13/2018 CLINICAL DATA:  68 year old male with elective hip surgery EXAM: OPERATIVE LEFT HIP (WITH PELVIS IF PERFORMED)  VIEWS TECHNIQUE: Fluoroscopic spot image(s) were submitted for interpretation post-operatively. COMPARISON:  08/09/2018 FINDINGS: Limited intraoperative fluoroscopic spot images demonstrates left hip arthroplasty. IMPRESSION: Left hip arthroplasty. Please refer to the dictated operative report for full details of intraoperative findings and procedure. Electronically Signed   By: Corrie Mckusick D.O.   On: 08/13/2018 09:47   Dg Hip Port Unilat With Pelvis 1v Left  Result Date: 08/15/2018 CLINICAL DATA:  Left hip pain EXAM: DG HIP (WITH OR WITHOUT PELVIS) 1V PORT LEFT COMPARISON:  None. FINDINGS: Total left hip arthroplasty with dislocation. No evidence of fracture on the single projection IMPRESSION: Dislocated left hip arthroplasty. Electronically Signed   By: Monte Fantasia M.D.   On: 08/15/2018 10:47   Dg Hip Operative Unilat W Or W/o Pelvis Left  Result Date: 08/17/2018 CLINICAL DATA:  Left total hip revision. EXAM: DG C-ARM 61-120 MIN; OPERATIVE LEFT HIP WITH PELVIS COMPARISON:  Radiograph dated 08/15/2018 FINDINGS: AP views of the left hip demonstrate that the acetabular and femoral components appear in good position in  the AP projection. No fractures. IMPRESSION: Revision of left total hip prosthesis performed. Components appear in good position in the AP projection. FLUOROSCOPY TIME:  59 seconds C-arm fluoroscopic images were obtained intraoperatively and submitted for post operative interpretation. Electronically Signed   By: Lorriane Shire M.D.   On: 08/17/2018 14:07   Dg Hip Operative Unilat W Or W/o  Pelvis Left  Result Date: 08/13/2018 CLINICAL DATA:  68 year old male with elective hip surgery EXAM: OPERATIVE LEFT HIP (WITH PELVIS IF PERFORMED)  VIEWS TECHNIQUE: Fluoroscopic spot image(s) were submitted for interpretation post-operatively. COMPARISON:  08/09/2018 FINDINGS: Limited intraoperative fluoroscopic spot images demonstrates left hip arthroplasty. IMPRESSION: Left hip arthroplasty. Please refer to the dictated operative report for full details of intraoperative findings and procedure. Electronically Signed   By: Corrie Mckusick D.O.   On: 08/13/2018 09:47   Xr Hip Unilat W Or W/o Pelvis 1v Left  Result Date: 08/09/2018 An AP pelvis and a lateral of the left hip shows avascular necrosis with flattening of the femoral head and evidence of collapse.   Disposition: Discharge disposition: 01-Home or Self Care       Discharge Instructions    Call MD / Call 911   Complete by:  As directed    If you experience chest pain or shortness of breath, CALL 911 and be transported to the hospital emergency room.  If you develope a fever above 101 F, pus (white drainage) or increased drainage or redness at the wound, or calf pain, call your surgeon's office.   Constipation Prevention   Complete by:  As directed    Drink plenty of fluids.  Prune juice may be helpful.  You may use a stool softener, such as Colace (over the counter) 100 mg twice a day.  Use MiraLax (over the counter) for constipation as needed.   Diet - low sodium heart healthy   Complete by:  As directed    Increase activity slowly as tolerated   Complete by:  As directed    Weight bearing as tolerated   Complete by:  As directed    Laterality:  bilateral   Extremity:  Lower      Follow-up Information    Mcarthur Rossetti, MD. Go on 08/30/2018.   Specialty:  Orthopedic Surgery Why:  Your follow up with MD is scheduled at 9:45 am. Contact information: [place] [address] [phone]         [facility] Follow up.   Why:  [facility] will be in contact upon discharge. You are approved for 5 visits of home health prior to your MD follow up appointment.           Signed: Mcarthur Rossetti 08/20/2018, 11:49 AM

## 2018-08-20 NOTE — Plan of Care (Signed)
  Problem: Clinical Measurements: Goal: Ability to maintain clinical measurements within normal limits will improve Outcome: Progressing   Problem: Activity: Goal: Risk for activity intolerance will decrease Outcome: Progressing   Problem: Nutrition: Goal: Adequate nutrition will be maintained Outcome: Progressing   Problem: Coping: Goal: Level of anxiety will decrease Outcome: Progressing   Problem: Safety: Goal: Ability to remain free from injury will improve Outcome: Progressing   Problem: Skin Integrity: Goal: Risk for impaired skin integrity will decrease Outcome: Progressing

## 2018-08-20 NOTE — Progress Notes (Signed)
Provided discharge education/instructions, all questions and concerns addressed, Pt not in distress, discharged home with belongings accompanied by daughter.

## 2018-08-20 NOTE — Plan of Care (Signed)
  Problem: Education: Goal: Knowledge of General Education information will improve Description Including pain rating scale, medication(s)/side effects and non-pharmacologic comfort measures Outcome: Adequate for Discharge   Problem: Health Behavior/Discharge Planning: Goal: Ability to manage health-related needs will improve Outcome: Adequate for Discharge   Problem: Clinical Measurements: Goal: Ability to maintain clinical measurements within normal limits will improve Outcome: Adequate for Discharge Goal: Will remain free from infection Outcome: Adequate for Discharge   Problem: Activity: Goal: Risk for activity intolerance will decrease Outcome: Adequate for Discharge   Problem: Nutrition: Goal: Adequate nutrition will be maintained Outcome: Adequate for Discharge   Problem: Safety: Goal: Ability to remain free from injury will improve Outcome: Adequate for Discharge   Problem: Skin Integrity: Goal: Risk for impaired skin integrity will decrease Outcome: Adequate for Discharge

## 2018-08-20 NOTE — Telephone Encounter (Signed)
Ortho Bundle call to patient and [facility] agency to confirm discharge plan of care.

## 2018-08-20 NOTE — TOC Transition Note (Signed)
Transition of Care [facility]) - CM/SW Discharge Note   Patient Details  Name: [name] MRN: [id number] Date of Birth: [birth year]  Transition of Care [facility] CM/SW Contact:  [place], [place] Phone Number: 08/20/2018, 9:31 AM  Final next level of care: [place] Barriers to Discharge: Barriers Resolved  Clinical Narrative:    Pt has been set up with ortho bundle services, will have [place] as arranged below at d/c.  Expected Discharge Plan and Services Per note from Ortho Bundle CM- Patient has all DME at home. Daughter will be assisting at discharge. Anticipate home health PT only.  Please contact individual below with any questions of if this plan should need to change. Jamse Arn, RN, BSN, [employer]  [phone] 08/13/2018, 11:00 AM  HH Arranged:  PT [place] Agency:  [place]    Social Determinants of Health (SDOH) Interventions     Readmission Risk Interventions No flowsheet data found.

## 2018-08-20 NOTE — Care Plan (Signed)
RNCM contacted hospital SW today prior to patient's discharge home. Noted that HHPT has been set up with [facility] in [place]. Will have MD write order for Home health. Spoke with [facility] agency and made aware of pending discharge and that hospital discharge planning will fax orders, and all paperwork needed for start of care over weekend. RNCM attempted to contact patient in hospital room prior to discharge, but there was no answer. Saw later in the day that patient sent message to MD through [place] asking about knee immobilizer. Confirmed with MD that patient needs to wear immobilizer at all times unless performing hygiene-may take off occasionally to allow leg to get some air. RNCM spoke with patient's daughter and confirmed this information. She confirmed they have heard from [facility] agency and someone will be out tomorrow late in afternoon for PT. Reminded of hip precautions and of contact information.

## 2018-08-20 NOTE — Discharge Instructions (Signed)
INSTRUCTIONS AFTER JOINT REPLACEMENT  ° °o Remove items at home which could result in a fall. This includes throw rugs or furniture in walking pathways °o ICE to the affected joint every three hours while awake for 30 minutes at a time, for at least the first 3-5 days, and then as needed for pain and swelling.  Continue to use ice for pain and swelling. You may notice swelling that will progress down to the foot and ankle.  This is normal after surgery.  Elevate your leg when you are not up walking on it.   °o Continue to use the breathing machine you got in the hospital (incentive spirometer) which will help keep your temperature down.  It is common for your temperature to cycle up and down following surgery, especially at night when you are not up moving around and exerting yourself.  The breathing machine keeps your lungs expanded and your temperature down. ° ° °DIET:  As you were doing prior to hospitalization, we recommend a well-balanced diet. ° °DRESSING / WOUND CARE / SHOWERING ° °Keep the surgical dressing until follow up.  The dressing is water proof, so you can shower without any extra covering.  IF THE DRESSING FALLS OFF or the wound gets wet inside, change the dressing with sterile gauze.  Please use good hand washing techniques before changing the dressing.  Do not use any lotions or creams on the incision until instructed by your surgeon.   ° °ACTIVITY ° °o Increase activity slowly as tolerated, but follow the weight bearing instructions below.   °o No driving for 6 weeks or until further direction given by your physician.  You cannot drive while taking narcotics.  °o No lifting or carrying greater than 10 lbs. until further directed by your surgeon. °o Avoid periods of inactivity such as sitting longer than an hour when not asleep. This helps prevent blood clots.  °o You may return to work once you are authorized by your doctor.  ° ° ° °WEIGHT BEARING  ° °Weight bearing as tolerated with assist  device (walker, cane, etc) as directed, use it as long as suggested by your surgeon or therapist, typically at least 4-6 weeks. ° ° °EXERCISES ° °Results after joint replacement surgery are often greatly improved when you follow the exercise, range of motion and muscle strengthening exercises prescribed by your doctor. Safety measures are also important to protect the joint from further injury. Any time any of these exercises cause you to have increased pain or swelling, decrease what you are doing until you are comfortable again and then slowly increase them. If you have problems or questions, call your caregiver or physical therapist for advice.  ° °Rehabilitation is important following a joint replacement. After just a few days of immobilization, the muscles of the leg can become weakened and shrink (atrophy).  These exercises are designed to build up the tone and strength of the thigh and leg muscles and to improve motion. Often times heat used for twenty to thirty minutes before working out will loosen up your tissues and help with improving the range of motion but do not use heat for the first two weeks following surgery (sometimes heat can increase post-operative swelling).  ° °These exercises can be done on a training (exercise) mat, on the floor, on a table or on a bed. Use whatever works the best and is most comfortable for you.    Use music or television while you are exercising so that   the exercises are a pleasant break in your day. This will make your life better with the exercises acting as a break in your routine that you can look forward to.   Perform all exercises about fifteen times, three times per day or as directed.  You should exercise both the operative leg and the other leg as well. ° °Exercises include: °  °• Quad Sets - Tighten up the muscle on the front of the thigh (Quad) and hold for 5-10 seconds.   °• Straight Leg Raises - With your knee straight (if you were given a brace, keep it on),  lift the leg to 60 degrees, hold for 3 seconds, and slowly lower the leg.  Perform this exercise against resistance later as your leg gets stronger.  °• Leg Slides: Lying on your back, slowly slide your foot toward your buttocks, bending your knee up off the floor (only go as far as is comfortable). Then slowly slide your foot back down until your leg is flat on the floor again.  °• Angel Wings: Lying on your back spread your legs to the side as far apart as you can without causing discomfort.  °• Hamstring Strength:  Lying on your back, push your heel against the floor with your leg straight by tightening up the muscles of your buttocks.  Repeat, but this time bend your knee to a comfortable angle, and push your heel against the floor.  You may put a pillow under the heel to make it more comfortable if necessary.  ° °A rehabilitation program following joint replacement surgery can speed recovery and prevent re-injury in the future due to weakened muscles. Contact your doctor or a physical therapist for more information on knee rehabilitation.  ° ° °CONSTIPATION ° °Constipation is defined medically as fewer than three stools per week and severe constipation as less than one stool per week.  Even if you have a regular bowel pattern at home, your normal regimen is likely to be disrupted due to multiple reasons following surgery.  Combination of anesthesia, postoperative narcotics, change in appetite and fluid intake all can affect your bowels.  ° °YOU MUST use at least one of the following options; they are listed in order of increasing strength to get the job done.  They are all available over the counter, and you may need to use some, POSSIBLY even all of these options:   ° °Drink plenty of fluids (prune juice may be helpful) and high fiber foods °Colace 100 mg by mouth twice a day  °Senokot for constipation as directed and as needed Dulcolax (bisacodyl), take with full glass of water  °Miralax (polyethylene glycol)  once or twice a day as needed. ° °If you have tried all these things and are unable to have a bowel movement in the first 3-4 days after surgery call either your surgeon or your primary doctor.   ° °If you experience loose stools or diarrhea, hold the medications until you stool forms back up.  If your symptoms do not get better within 1 week or if they get worse, check with your doctor.  If you experience "the worst abdominal pain ever" or develop nausea or vomiting, please contact the office immediately for further recommendations for treatment. ° ° °ITCHING:  If you experience itching with your medications, try taking only a single pain pill, or even half a pain pill at a time.  You can also use Benadryl over the counter for itching or also to   help with sleep.   TED HOSE STOCKINGS:  Use stockings on both legs until for at least 2 weeks or as directed by physician office. They may be removed at night for sleeping.  MEDICATIONS:  See your medication summary on the After Visit Summary that nursing will review with you.  You may have some home medications which will be placed on hold until you complete the course of blood thinner medication.  It is important for you to complete the blood thinner medication as prescribed.  PRECAUTIONS:  If you experience chest pain or shortness of breath - call 911 immediately for transfer to the hospital emergency department.   If you develop a fever greater that 101 F, purulent drainage from wound, increased redness or drainage from wound, foul odor from the wound/dressing, or calf pain - CONTACT YOUR SURGEON.                                                   FOLLOW-UP APPOINTMENTS:  If you do not already have a post-op appointment, please call the office for an appointment to be seen by your surgeon.  Guidelines for how soon to be seen are listed in your After Visit Summary, but are typically between 1-4 weeks after surgery.  OTHER INSTRUCTIONS:   Knee  Replacement:  Do not place pillow under knee, focus on keeping the knee straight while resting. CPM instructions: 0-90 degrees, 2 hours in the morning, 2 hours in the afternoon, and 2 hours in the evening. Place foam block, curve side up under heel at all times except when in CPM or when walking.  DO NOT modify, tear, cut, or change the foam block in any way.  MAKE SURE YOU:   Understand these instructions.   Get help right away if you are not doing well or get worse.    Thank you for letting us be a part of your medical care team.  It is a privilege we respect greatly.  We hope these instructions will help you stay on track for a fast and full recovery!     YOU CAN CHANGE YOUR DRESSING IN 5 - 6 DAYS IF NEEDED DUE TO BLOODY DRAINAGE OR IF THE DRAINAGE GETS SIGNIFICANTLY WET AND DIRTY.  NEW DRESSINGS HAVE BEEN SUPPLIED TO YOU.

## 2018-08-20 NOTE — TOC Transition Note (Signed)
Transition of Care [facility]) - CM/SW Discharge Note   Patient Details  Name: JOSEJUAN HOAGLIN MRN: [id number] Date of Birth: Apr 21, 1951  Transition of Care [facility]) CM/SW Contact:  Pollie Friar, RN Phone Number: 08/20/2018, 11:59 AM   Clinical Narrative:    Pt discharging home today. CM called [facility] and informed of d/c. Summary faxed to number provided.  Pt has transport home. Daughter to also assist at home.   Final next level of care: [place] Barriers to Discharge: Barriers Resolved   Patient Goals and CMS Choice        Discharge Placement                       Discharge Plan and Services                DME Arranged: 3-N-1 DME Agency: [facility] Arranged: PT [place] Agency: [place]   Social Determinants of Health (SDOH) Interventions     Readmission Risk Interventions No flowsheet data found.

## 2018-08-20 NOTE — Progress Notes (Signed)
Physical Therapy Treatment Patient Details Name: [name] MRN: [id number] DOB: [birth year] Today's Date: 08/20/2018    History of Present Illness 68 y.o. male, has a history of pain and functional disability in the left hip(s) due to avascular necrosis with femoral head collapse. s/p Left total hip arthroplasty through direct anterior approach 08/13/2018 08/15/18 Pt self dislocated L hip attempting to put on shoes to go home. Returned to OR for relocation of hip. s/p L hip arthroplasty repair 08/17/18. PMH: CAD, RA, recurrent DVT ; multiple back surgeries    PT Comments    Pt continues to make slow progress towards his goals. Focus of today's session stair training. Pt currently min guard for bed mobility, transfers and ambulation and requires minA for support with ascent/descent of steps. Pt educated on need for good pain control so that he can maintain mobility every hour to improve circulation and healing. Pt ready for d/c home this afternoon.     Follow Up Recommendations  Follow surgeon's recommendation for DC plan and follow-up therapies     Equipment Recommendations  None recommended by PT       Precautions / Restrictions Precautions Precautions: Posterior Hip Precaution Booklet Issued: Yes (comment) Precaution Comments: educated in hip precautions related to LB bathing and dressing Required Braces or Orthoses: Knee Immobilizer - Left Knee Immobilizer - Left: On at all times(to reduce risk of dislocation) Restrictions Weight Bearing Restrictions: Yes LLE Weight Bearing: Weight bearing as tolerated    Mobility  Bed Mobility Overal bed mobility: Needs Assistance Bed Mobility: Supine to Sit;Sit to Supine     Supine to sit: Supervision Sit to supine: Min guard   General bed mobility comments: min guard for safety  Transfers Overall transfer level: Needs assistance Equipment used: Rolling walker (2 wheeled) Transfers: Sit to/from Stand Sit to Stand: Min guard;From  elevated surface         General transfer comment: min guard for safety, extra effort to increase trunk upright to maintain hip precautions  Ambulation/Gait Ambulation/Gait assistance: Min guard Gait Distance (Feet): 15 Feet Assistive device: Rolling walker (2 wheeled) Gait Pattern/deviations: Step-through pattern;Shuffle;Trunk flexed Gait velocity: slowed Gait velocity interpretation: <1.31 ft/sec, indicative of household ambulator General Gait Details: min guard for safety,  vc for upright posture and reduced IR with pivoting to turn   Stairs Stairs: Yes Stairs assistance: Min assist Stair Management: One rail Right Number of Stairs: 3(2x3) General stair comments: minA for support with ascent/descent of steps, vc for sequencing and reduction of flexed forward posture   Wheelchair Mobility    Modified Rankin (Stroke Patients Only)       Balance Overall balance assessment: Needs assistance   Sitting balance-[name]: Good     Standing balance support: Bilateral upper extremity supported Standing balance-[name]: Poor                              Cognition Arousal/Alertness: Awake/alert Behavior During Therapy: WFL for tasks assessed/performed Overall Cognitive Status: Within Functional Limits for tasks assessed                                           General Comments General comments (skin integrity, edema, etc.): Wound vac removed and dressing clean, dry and in tact      Pertinent Vitals/Pain Pain Assessment: Faces Faces Pain Scale:  Hurts even more Pain Location: L hip  Pain Descriptors / Indicators: Aching;Sore;Throbbing Pain Intervention(s): Limited activity within patient's tolerance;Monitored during session;Repositioned           PT Goals (current goals can now be found in the care plan section) Acute Rehab PT Goals Patient Stated Goal: have less pain with walking PT Goal Formulation: With patient Time For  Goal Achievement: 08/27/18 Potential to Achieve Goals: Fair Progress towards PT goals: Progressing toward goals    Frequency    Min 5X/week      PT Plan Current plan remains appropriate    Co-evaluation              AM-PAC PT "6 Clicks" Mobility   Outcome Measure  Help needed turning from your back to your side while in a flat bed without using bedrails?: A Little Help needed moving from lying on your back to sitting on the side of a flat bed without using bedrails?: A Little Help needed moving to and from a bed to a chair (including a wheelchair)?: A Little Help needed standing up from a chair using your arms (e.g., wheelchair or bedside chair)?: A Little Help needed to walk in hospital room?: A Little Help needed climbing 3-5 steps with a railing? : A Little 6 Click Score: 18    End of Session Equipment Utilized During Treatment: Gait belt Activity Tolerance: Patient limited by pain Patient left: in chair;with call bell/phone within reach;with chair alarm set Nurse Communication: Mobility status;Precautions;Weight bearing status PT Visit Diagnosis: Unsteadiness on feet (R26.81);Other abnormalities of gait and mobility (R26.89);Muscle weakness (generalized) (M62.81);Difficulty in walking, not elsewhere classified (R26.2);Pain Pain - Right/Left: Left Pain - part of body: Hip     Time: 8675-4492 PT Time Calculation (min) (ACUTE ONLY): 31 min  Charges:  $Gait Training: 23-37 mins                     Avangeline Stockburger B. Migdalia Dk PT, DPT Acute Rehabilitation Services Pager [phone] Office [phone]    [place] 08/20/2018, 2:20 PM

## 2018-08-21 DIAGNOSIS — I6521 Occlusion and stenosis of right carotid artery: Secondary | ICD-10-CM | POA: Diagnosis not present

## 2018-08-21 DIAGNOSIS — I2510 Atherosclerotic heart disease of native coronary artery without angina pectoris: Secondary | ICD-10-CM | POA: Diagnosis not present

## 2018-08-21 DIAGNOSIS — T84021D Dislocation of internal left hip prosthesis, subsequent encounter: Secondary | ICD-10-CM | POA: Diagnosis not present

## 2018-08-21 DIAGNOSIS — G629 Polyneuropathy, unspecified: Secondary | ICD-10-CM | POA: Diagnosis not present

## 2018-08-21 DIAGNOSIS — K219 Gastro-esophageal reflux disease without esophagitis: Secondary | ICD-10-CM | POA: Diagnosis not present

## 2018-08-21 DIAGNOSIS — D689 Coagulation defect, unspecified: Secondary | ICD-10-CM | POA: Diagnosis not present

## 2018-08-21 DIAGNOSIS — J8417 Other interstitial pulmonary diseases with fibrosis in diseases classified elsewhere: Secondary | ICD-10-CM | POA: Diagnosis not present

## 2018-08-21 DIAGNOSIS — Z7952 Long term (current) use of systemic steroids: Secondary | ICD-10-CM | POA: Diagnosis not present

## 2018-08-21 DIAGNOSIS — M797 Fibromyalgia: Secondary | ICD-10-CM | POA: Diagnosis not present

## 2018-08-21 DIAGNOSIS — M069 Rheumatoid arthritis, unspecified: Secondary | ICD-10-CM | POA: Diagnosis not present

## 2018-08-21 DIAGNOSIS — Z86718 Personal history of other venous thrombosis and embolism: Secondary | ICD-10-CM | POA: Diagnosis not present

## 2018-08-21 DIAGNOSIS — C9110 Chronic lymphocytic leukemia of B-cell type not having achieved remission: Secondary | ICD-10-CM | POA: Diagnosis not present

## 2018-08-21 DIAGNOSIS — M4805 Spinal stenosis, thoracolumbar region: Secondary | ICD-10-CM | POA: Diagnosis not present

## 2018-08-21 DIAGNOSIS — B20 Human immunodeficiency virus [HIV] disease: Secondary | ICD-10-CM | POA: Diagnosis not present

## 2018-08-21 DIAGNOSIS — I10 Essential (primary) hypertension: Secondary | ICD-10-CM | POA: Diagnosis not present

## 2018-08-21 DIAGNOSIS — E785 Hyperlipidemia, unspecified: Secondary | ICD-10-CM | POA: Diagnosis not present

## 2018-08-21 DIAGNOSIS — Z8673 Personal history of transient ischemic attack (TIA), and cerebral infarction without residual deficits: Secondary | ICD-10-CM | POA: Diagnosis not present

## 2018-08-21 DIAGNOSIS — Z96653 Presence of artificial knee joint, bilateral: Secondary | ICD-10-CM | POA: Diagnosis not present

## 2018-08-21 DIAGNOSIS — M810 Age-related osteoporosis without current pathological fracture: Secondary | ICD-10-CM | POA: Diagnosis not present

## 2018-08-21 DIAGNOSIS — M109 Gout, unspecified: Secondary | ICD-10-CM | POA: Diagnosis not present

## 2018-08-21 DIAGNOSIS — F418 Other specified anxiety disorders: Secondary | ICD-10-CM | POA: Diagnosis not present

## 2018-08-23 ENCOUNTER — Other Ambulatory Visit (INDEPENDENT_AMBULATORY_CARE_PROVIDER_SITE_OTHER): Payer: Self-pay | Admitting: Orthopaedic Surgery

## 2018-08-23 ENCOUNTER — Telehealth (INDEPENDENT_AMBULATORY_CARE_PROVIDER_SITE_OTHER): Payer: Self-pay | Admitting: *Deleted

## 2018-08-23 DIAGNOSIS — T84021D Dislocation of internal left hip prosthesis, subsequent encounter: Secondary | ICD-10-CM | POA: Diagnosis not present

## 2018-08-23 DIAGNOSIS — J8417 Other interstitial pulmonary diseases with fibrosis in diseases classified elsewhere: Secondary | ICD-10-CM | POA: Diagnosis not present

## 2018-08-23 DIAGNOSIS — C9110 Chronic lymphocytic leukemia of B-cell type not having achieved remission: Secondary | ICD-10-CM | POA: Diagnosis not present

## 2018-08-23 DIAGNOSIS — I2510 Atherosclerotic heart disease of native coronary artery without angina pectoris: Secondary | ICD-10-CM | POA: Diagnosis not present

## 2018-08-23 DIAGNOSIS — I10 Essential (primary) hypertension: Secondary | ICD-10-CM | POA: Diagnosis not present

## 2018-08-23 DIAGNOSIS — M069 Rheumatoid arthritis, unspecified: Secondary | ICD-10-CM | POA: Diagnosis not present

## 2018-08-23 MED ORDER — DOXYCYCLINE HYCLATE 100 MG PO TABS: 100.0000 mg | ORAL_TABLET | Freq: Two times a day (BID) | ORAL | 0 refills | Status: DC

## 2018-08-23 MED FILL — Brexpiprazole Tab 0.5 MG: ORAL | Qty: 1 | Status: AC

## 2018-08-23 NOTE — Telephone Encounter (Signed)
Discharge phone call for Ortho bundle completed.

## 2018-08-23 NOTE — Care Plan (Signed)
RNCM called to check on patient after discharge home on Friday, 08/20/18. He reports he did well over the weekend. HH PT did come out over the weekend. They will be coming back out this afternoon. States he is doing very well. Discussed changing his f/u appointment from 08/30/18 to 08/31/18. RNCM will contact back with new appointment time as requested by MD. Will call back and check on his status tomorrow to see how his therapy went today.

## 2018-08-24 ENCOUNTER — Telehealth (INDEPENDENT_AMBULATORY_CARE_PROVIDER_SITE_OTHER): Payer: Self-pay

## 2018-08-24 ENCOUNTER — Telehealth (INDEPENDENT_AMBULATORY_CARE_PROVIDER_SITE_OTHER): Payer: Self-pay | Admitting: *Deleted

## 2018-08-24 ENCOUNTER — Encounter: Payer: Self-pay | Admitting: Internal Medicine

## 2018-08-24 ENCOUNTER — Ambulatory Visit (INDEPENDENT_AMBULATORY_CARE_PROVIDER_SITE_OTHER): Payer: Medicare Other | Admitting: Internal Medicine

## 2018-08-24 ENCOUNTER — Ambulatory Visit: Payer: Self-pay

## 2018-08-24 DIAGNOSIS — J189 Pneumonia, unspecified organism: Secondary | ICD-10-CM

## 2018-08-24 MED ORDER — DEXTROMETHORPHAN POLISTIREX ER 30 MG/5ML PO SUER: 30.0000 mg | Freq: Two times a day (BID) | ORAL | 1 refills | Status: DC

## 2018-08-24 NOTE — Telephone Encounter (Signed)
Since Sunday, pt c/o fever, headache, SOB, chest tightness, chest congestion, headache, sore throat. Pt stated symptoms are getting worse. Pt has h/o HIV and CABG in 2010. Pt given care advice and transferred to office to [place].           Reason for Disposition . HIGH RISK patient (e.g., age > 64 years, diabetes, heart or lung disease, weak immune system)  Answer Assessment - Initial Assessment Questions 1. COVID-19 DIAGNOSIS: "Who made your Coronavirus (COVID-19) diagnosis?" "Was it confirmed by a positive lab test?" If not diagnosed by a HCP, ask "Are there lots of cases (community spread) where you live?" (See public health department website, if unsure)   * MAJOR community spread: high number of cases; numbers of cases are increasing; many people hospitalized.   * MINOR community spread: low number of cases; not increasing; few or no people hospitalized     major 2. ONSET: "When did the COVID-19 symptoms start?"      Sunday 3. WORST SYMPTOM: "What is your worst symptom?" (e.g., cough, fever, shortness of breath, muscle aches)     SOB, chest tightness, chest "burning", chest congestion 4. COUGH: "How bad is the cough?"       Productive cough cleat phlegm - chest rattling 5. FEVER: "Do you have a fever?" If so, ask: "What is your temperature, how was it measured, and when did it start?"     10 0.0-this morning-- yesterday afternoon 6. RESPIRATORY STATUS: "Describe your breathing?" (e.g., shortness of breath, wheezing, unable to speak)      SOB, chest tighness, chest congestion 7. BETTER-SAME-WORSE: "Are you getting better, staying the same or getting worse compared to yesterday?"  If getting worse, ask, "In what way?"     Symptoms are getting worse 8. HIGH RISK DISEASE: "Do you have any chronic medical problems?" (e.g., asthma, heart or lung disease, weak immune system, etc.)     HIV +, CABG 2010 9. PREGNANCY: "Is there any chance you are pregnant?" "When was your last menstrual  period?"     n/a 10. OTHER SYMPTOMS: "Do you have any other symptoms?"  (e.g., runny nose, headache, sore throat, loss of smell)      Headache, sore throat  Protocols used: CORONAVIRUS (COVID-19) DIAGNOSED OR SUSPECTED-A-AH

## 2018-08-24 NOTE — Telephone Encounter (Signed)
Heather, Publishing copy at [facility] would like an order for [facility] to access patient's wound.  Stated that patient's wound has been draining and patient has had a fever and congestion.  Patient had Left Hip Revision on 08/17/2018.  Cb# is [phone].  Please advise.  Thank you.

## 2018-08-24 NOTE — Telephone Encounter (Signed)
Per Judeen Hammans, this has already been done

## 2018-08-24 NOTE — Telephone Encounter (Signed)
Appointment scheduled.

## 2018-08-24 NOTE — Care Plan (Signed)
RNCM received call after hours on Monday, 08/23/18 from patient's daughter. She informed that HHPT was in home earlier and was concerned about drainage from the incision site, swelling of left leg, slight fever of 100.3 and a slight moist cough. MD noted that swelling of the leg and increased drainage are to be expected after extensive surgery. Requested the family change the dressing daily if needed and MD will call in a new antibiotic. Updated family on this information. RNCM will contact on Tuesday to check status. Received call in the morning from patient's daughter on Tuesday, 08/24/18 noting that his cough is worse and fever remains of 100.0. Reports he also is having some increase in SOB with some tightness. MD wanted to ensure that the patient did restart his anticoag therapy previously on prior to surgery and patient confirmed he did restart Savaysa. Recommended patient or daughter contact his PCP to discuss symptoms not related to hip surgery. RNCM called to patient's daughter later in the day and she reports he did see his PCP via virtual visit and was informed that he should start the doxycycline prescribed by Dr. Ninfa Linden. Will call in Rx for cough medication to help expectorate any secretions and his PCP feels this is pneumonia. Noted in chart that client has been screened by his PCP and determined to be high risk for Covid-19. PCP (Dr. Ronnald Ramp) recommended via his office note that if no improvement in the next several days, then will consider other options.  RNCM also reached out to [facility] and updated Lauren, the clinical coordinator, on all activity today. No change to HHPT or addition of [facility].  Updated on instructions from Dr. Ninfa Linden regarding hip drainage and changing of dressing daily. May call RNCM with any further questions.

## 2018-08-24 NOTE — Telephone Encounter (Signed)
Ortho Bundle RNCM phone call post-discharge. See care plan note.

## 2018-08-24 NOTE — Progress Notes (Signed)
Virtual Visit via Video Note  I connected with [name] on 08/24/18 at  2:00 PM EDT by a video enabled telemedicine application and verified that I am speaking with the correct person using two identifiers.   I discussed the limitations of evaluation and management by telemedicine and the availability of in person appointments. The patient expressed understanding and agreed to proceed.  History of Present Illness: He checked in for a virtual visit.  He was not willing to come in because of the COVID-19 pandemic.  He complains of a 3-day history of sore throat, headache, fever to 100, cough productive of clear phlegm, shortness of breath, burning in his chest, chest pain caused by cough, and chills.  He denies night sweats and reports a good appetite.  He called an orthopedist earlier today and they added doxycycline to the Bactrim DS that he however already takes for pneumocystis prophylaxis.  He is not taking a cough suppressant.  He is taking Dilaudid for pain control.  He is allergic to promethazine and would rather not take it.    Observations/Objective: On the video feed he did not appear to be any acute distress.  He appears somewhat older than his stated age.  He was calm, cooperative, and appropriate.  There was a family member in attendance.   Assessment and Plan: The presumed diagnosis is pneumonia.  He was not willing to come in for chest x-ray.  He just tarted the doxycycline this morning.  He will let me know of his response to doxycycline.  If he does not respond to it in the next few days then I will change to a different antibiotic.  For now, I have asked him to start taking dextromethorphan for cough suppression.   Follow Up Instructions: He agrees to take the dextromethorphan as directed.  He agrees to be compliant with the doxycycline and to let me know if he develops any new or worsening symptoms.    I discussed the assessment and treatment plan with the patient. The  patient was provided an opportunity to ask questions and all were answered. The patient agreed with the plan and demonstrated an understanding of the instructions.   The patient was advised to call back or seek an in-person evaluation if the symptoms worsen or if the condition fails to improve as anticipated.  I provided 25 minutes of non-face-to-face time during this encounter.   [name], MD

## 2018-08-25 ENCOUNTER — Encounter: Payer: Self-pay | Admitting: Internal Medicine

## 2018-08-26 ENCOUNTER — Telehealth (INDEPENDENT_AMBULATORY_CARE_PROVIDER_SITE_OTHER): Payer: Self-pay

## 2018-08-26 ENCOUNTER — Inpatient Hospital Stay (INDEPENDENT_AMBULATORY_CARE_PROVIDER_SITE_OTHER): Payer: Medicare Other | Admitting: Orthopedic Surgery

## 2018-08-26 DIAGNOSIS — I10 Essential (primary) hypertension: Secondary | ICD-10-CM | POA: Diagnosis not present

## 2018-08-26 DIAGNOSIS — I2510 Atherosclerotic heart disease of native coronary artery without angina pectoris: Secondary | ICD-10-CM | POA: Diagnosis not present

## 2018-08-26 DIAGNOSIS — J8417 Other interstitial pulmonary diseases with fibrosis in diseases classified elsewhere: Secondary | ICD-10-CM | POA: Diagnosis not present

## 2018-08-26 DIAGNOSIS — T84021D Dislocation of internal left hip prosthesis, subsequent encounter: Secondary | ICD-10-CM | POA: Diagnosis not present

## 2018-08-26 DIAGNOSIS — C9110 Chronic lymphocytic leukemia of B-cell type not having achieved remission: Secondary | ICD-10-CM | POA: Diagnosis not present

## 2018-08-26 DIAGNOSIS — M069 Rheumatoid arthritis, unspecified: Secondary | ICD-10-CM | POA: Diagnosis not present

## 2018-08-26 NOTE — Telephone Encounter (Signed)
[name], physical therapist would like to know if Dr. Ninfa Linden can put in an order for a Hospital Bed for patient from [facility] in [place], [place] at [phone].  Stated that patient is struggling to get out of chair.  CB# for randy is [phone].  Please advise.  Thank you.

## 2018-08-26 NOTE — Telephone Encounter (Signed)
See below

## 2018-08-26 NOTE — Telephone Encounter (Signed)
That will be fine for an order for a hospital bed.

## 2018-08-26 NOTE — Telephone Encounter (Signed)
Faxed to provided number  

## 2018-08-27 ENCOUNTER — Ambulatory Visit: Payer: Self-pay

## 2018-08-27 DIAGNOSIS — J8417 Other interstitial pulmonary diseases with fibrosis in diseases classified elsewhere: Secondary | ICD-10-CM | POA: Diagnosis not present

## 2018-08-27 DIAGNOSIS — I10 Essential (primary) hypertension: Secondary | ICD-10-CM | POA: Diagnosis not present

## 2018-08-27 DIAGNOSIS — T84021D Dislocation of internal left hip prosthesis, subsequent encounter: Secondary | ICD-10-CM | POA: Diagnosis not present

## 2018-08-27 DIAGNOSIS — I2510 Atherosclerotic heart disease of native coronary artery without angina pectoris: Secondary | ICD-10-CM | POA: Diagnosis not present

## 2018-08-27 DIAGNOSIS — M069 Rheumatoid arthritis, unspecified: Secondary | ICD-10-CM | POA: Diagnosis not present

## 2018-08-27 DIAGNOSIS — C9110 Chronic lymphocytic leukemia of B-cell type not having achieved remission: Secondary | ICD-10-CM | POA: Diagnosis not present

## 2018-08-27 NOTE — Telephone Encounter (Signed)
Patient states he doesn't feel like this is pneumonia, he is feeling better---did not want to come in for office visit today---advised patient if he worsens, he can go to urgent care or ED over the weekend--our office is closed tomorrow but can call back on Monday if he has any further questions

## 2018-08-27 NOTE — Telephone Encounter (Signed)
He needs to come in for an OV and CXR

## 2018-08-27 NOTE — Telephone Encounter (Signed)
Patient's daughter [name] called and says this morning she was helping her dad to the commode and while he was sitting there, he started shivering and shaking all over. She says he was asking for his oxygen. She got him back to the recliner, checked his temperature several times and it was 98.6 each time. She says she warmed up blankets, 3 of them, and wrapped him in them. She says now he's resting. She says last night they switched pain medications from Dilaudid to Vicodin and he took 2 this morning at 0630. She says he had 2 BM's since yesterday. She says he had pneumonia in the hospital and is on antibiotics for that, is now having a productive cough and recovering from the pneumonia. She says she would like a to know what is going on and if it's something that she needs to be doing. She called the surgeon who did the hip surgery and was told to call the PCP. I advised I will send this note to Dr. Ronnald Ramp and someone will call back with his recommendation.  Reason for Disposition . [1] Caller requesting NON-URGENT health information AND [2] PCP's office is the best resource  Protocols used: INFORMATION ONLY CALL-A-AH

## 2018-08-27 NOTE — Telephone Encounter (Signed)
Routing to dr jones, please advise, thanks 

## 2018-08-28 ENCOUNTER — Emergency Department (HOSPITAL_COMMUNITY): Payer: Medicare Other

## 2018-08-28 ENCOUNTER — Encounter (HOSPITAL_COMMUNITY)
Admission: EM | Disposition: A | Discharge status: Skilled Nursing Facility | Payer: Self-pay | Source: Home / Self Care | Attending: Orthopaedic Surgery

## 2018-08-28 ENCOUNTER — Inpatient Hospital Stay (HOSPITAL_COMMUNITY)
Admission: EM | Admit: 2018-08-28 | Discharge: 2018-09-01 | DRG: 560 | Disposition: A | Discharge status: Skilled Nursing Facility | Payer: Medicare Other | Attending: Orthopaedic Surgery | Admitting: Orthopaedic Surgery

## 2018-08-28 ENCOUNTER — Emergency Department (HOSPITAL_COMMUNITY): Payer: Medicare Other | Admitting: Registered Nurse

## 2018-08-28 ENCOUNTER — Other Ambulatory Visit: Payer: Self-pay

## 2018-08-28 ENCOUNTER — Encounter (HOSPITAL_COMMUNITY): Payer: Self-pay | Admitting: Emergency Medicine

## 2018-08-28 DIAGNOSIS — C9110 Chronic lymphocytic leukemia of B-cell type not having achieved remission: Secondary | ICD-10-CM | POA: Diagnosis present

## 2018-08-28 DIAGNOSIS — S73005A Unspecified dislocation of left hip, initial encounter: Secondary | ICD-10-CM

## 2018-08-28 DIAGNOSIS — T84029A Dislocation of unspecified internal joint prosthesis, initial encounter: Secondary | ICD-10-CM | POA: Diagnosis present

## 2018-08-28 DIAGNOSIS — M24459 Recurrent dislocation, unspecified hip: Secondary | ICD-10-CM | POA: Diagnosis present

## 2018-08-28 DIAGNOSIS — L89152 Pressure ulcer of sacral region, stage 2: Secondary | ICD-10-CM | POA: Diagnosis not present

## 2018-08-28 DIAGNOSIS — R278 Other lack of coordination: Secondary | ICD-10-CM | POA: Diagnosis not present

## 2018-08-28 DIAGNOSIS — Z419 Encounter for procedure for purposes other than remedying health state, unspecified: Secondary | ICD-10-CM

## 2018-08-28 DIAGNOSIS — Z825 Family history of asthma and other chronic lower respiratory diseases: Secondary | ICD-10-CM

## 2018-08-28 DIAGNOSIS — Z7401 Bed confinement status: Secondary | ICD-10-CM | POA: Diagnosis not present

## 2018-08-28 DIAGNOSIS — I252 Old myocardial infarction: Secondary | ICD-10-CM

## 2018-08-28 DIAGNOSIS — R062 Wheezing: Secondary | ICD-10-CM

## 2018-08-28 DIAGNOSIS — I10 Essential (primary) hypertension: Secondary | ICD-10-CM | POA: Diagnosis not present

## 2018-08-28 DIAGNOSIS — Z79899 Other long term (current) drug therapy: Secondary | ICD-10-CM | POA: Diagnosis not present

## 2018-08-28 DIAGNOSIS — R059 Cough, unspecified: Secondary | ICD-10-CM

## 2018-08-28 DIAGNOSIS — F418 Other specified anxiety disorders: Secondary | ICD-10-CM | POA: Diagnosis present

## 2018-08-28 DIAGNOSIS — Y792 Prosthetic and other implants, materials and accessory orthopedic devices associated with adverse incidents: Secondary | ICD-10-CM | POA: Diagnosis present

## 2018-08-28 DIAGNOSIS — Z888 Allergy status to other drugs, medicaments and biological substances status: Secondary | ICD-10-CM | POA: Diagnosis not present

## 2018-08-28 DIAGNOSIS — Z885 Allergy status to narcotic agent status: Secondary | ICD-10-CM

## 2018-08-28 DIAGNOSIS — Z7951 Long term (current) use of inhaled steroids: Secondary | ICD-10-CM | POA: Diagnosis not present

## 2018-08-28 DIAGNOSIS — Z8249 Family history of ischemic heart disease and other diseases of the circulatory system: Secondary | ICD-10-CM | POA: Diagnosis not present

## 2018-08-28 DIAGNOSIS — Z955 Presence of coronary angioplasty implant and graft: Secondary | ICD-10-CM | POA: Diagnosis not present

## 2018-08-28 DIAGNOSIS — Z96649 Presence of unspecified artificial hip joint: Secondary | ICD-10-CM

## 2018-08-28 DIAGNOSIS — M069 Rheumatoid arthritis, unspecified: Secondary | ICD-10-CM | POA: Diagnosis present

## 2018-08-28 DIAGNOSIS — Z21 Asymptomatic human immunodeficiency virus [HIV] infection status: Secondary | ICD-10-CM | POA: Diagnosis present

## 2018-08-28 DIAGNOSIS — K219 Gastro-esophageal reflux disease without esophagitis: Secondary | ICD-10-CM | POA: Diagnosis present

## 2018-08-28 DIAGNOSIS — E785 Hyperlipidemia, unspecified: Secondary | ICD-10-CM | POA: Diagnosis not present

## 2018-08-28 DIAGNOSIS — Z01818 Encounter for other preprocedural examination: Secondary | ICD-10-CM | POA: Diagnosis not present

## 2018-08-28 DIAGNOSIS — M2550 Pain in unspecified joint: Secondary | ICD-10-CM | POA: Diagnosis not present

## 2018-08-28 DIAGNOSIS — M6281 Muscle weakness (generalized): Secondary | ICD-10-CM | POA: Diagnosis not present

## 2018-08-28 DIAGNOSIS — R05 Cough: Secondary | ICD-10-CM | POA: Diagnosis not present

## 2018-08-28 DIAGNOSIS — Z91010 Allergy to peanuts: Secondary | ICD-10-CM | POA: Diagnosis not present

## 2018-08-28 DIAGNOSIS — I25810 Atherosclerosis of coronary artery bypass graft(s) without angina pectoris: Secondary | ICD-10-CM | POA: Diagnosis not present

## 2018-08-28 DIAGNOSIS — N529 Male erectile dysfunction, unspecified: Secondary | ICD-10-CM | POA: Diagnosis present

## 2018-08-28 DIAGNOSIS — E669 Obesity, unspecified: Secondary | ICD-10-CM | POA: Diagnosis present

## 2018-08-28 DIAGNOSIS — G629 Polyneuropathy, unspecified: Secondary | ICD-10-CM | POA: Diagnosis present

## 2018-08-28 DIAGNOSIS — X501XXA Overexertion from prolonged static or awkward postures, initial encounter: Secondary | ICD-10-CM | POA: Diagnosis not present

## 2018-08-28 DIAGNOSIS — M24452 Recurrent dislocation, left hip: Secondary | ICD-10-CM | POA: Diagnosis not present

## 2018-08-28 DIAGNOSIS — Z7901 Long term (current) use of anticoagulants: Secondary | ICD-10-CM

## 2018-08-28 DIAGNOSIS — J849 Interstitial pulmonary disease, unspecified: Secondary | ICD-10-CM | POA: Diagnosis not present

## 2018-08-28 DIAGNOSIS — R0902 Hypoxemia: Secondary | ICD-10-CM | POA: Diagnosis not present

## 2018-08-28 DIAGNOSIS — B20 Human immunodeficiency virus [HIV] disease: Secondary | ICD-10-CM | POA: Diagnosis not present

## 2018-08-28 DIAGNOSIS — Z96642 Presence of left artificial hip joint: Secondary | ICD-10-CM | POA: Diagnosis not present

## 2018-08-28 DIAGNOSIS — T84021A Dislocation of internal left hip prosthesis, initial encounter: Secondary | ICD-10-CM | POA: Diagnosis not present

## 2018-08-28 DIAGNOSIS — R52 Pain, unspecified: Secondary | ICD-10-CM | POA: Diagnosis not present

## 2018-08-28 DIAGNOSIS — F329 Major depressive disorder, single episode, unspecified: Secondary | ICD-10-CM | POA: Diagnosis not present

## 2018-08-28 DIAGNOSIS — M109 Gout, unspecified: Secondary | ICD-10-CM | POA: Diagnosis present

## 2018-08-28 DIAGNOSIS — Z951 Presence of aortocoronary bypass graft: Secondary | ICD-10-CM | POA: Diagnosis not present

## 2018-08-28 DIAGNOSIS — Z7952 Long term (current) use of systemic steroids: Secondary | ICD-10-CM

## 2018-08-28 DIAGNOSIS — Z79891 Long term (current) use of opiate analgesic: Secondary | ICD-10-CM

## 2018-08-28 DIAGNOSIS — M810 Age-related osteoporosis without current pathological fracture: Secondary | ICD-10-CM | POA: Diagnosis present

## 2018-08-28 DIAGNOSIS — R2681 Unsteadiness on feet: Secondary | ICD-10-CM | POA: Diagnosis not present

## 2018-08-28 DIAGNOSIS — S73005S Unspecified dislocation of left hip, sequela: Secondary | ICD-10-CM | POA: Diagnosis not present

## 2018-08-28 DIAGNOSIS — M879 Osteonecrosis, unspecified: Secondary | ICD-10-CM | POA: Diagnosis not present

## 2018-08-28 DIAGNOSIS — Z8673 Personal history of transient ischemic attack (TIA), and cerebral infarction without residual deficits: Secondary | ICD-10-CM

## 2018-08-28 DIAGNOSIS — Z880 Allergy status to penicillin: Secondary | ICD-10-CM

## 2018-08-28 DIAGNOSIS — R000 Tachycardia, unspecified: Secondary | ICD-10-CM | POA: Diagnosis not present

## 2018-08-28 DIAGNOSIS — Z823 Family history of stroke: Secondary | ICD-10-CM

## 2018-08-28 DIAGNOSIS — I509 Heart failure, unspecified: Secondary | ICD-10-CM | POA: Diagnosis not present

## 2018-08-28 DIAGNOSIS — Z8349 Family history of other endocrine, nutritional and metabolic diseases: Secondary | ICD-10-CM

## 2018-08-28 DIAGNOSIS — R2689 Other abnormalities of gait and mobility: Secondary | ICD-10-CM | POA: Diagnosis not present

## 2018-08-28 DIAGNOSIS — I2510 Atherosclerotic heart disease of native coronary artery without angina pectoris: Secondary | ICD-10-CM | POA: Diagnosis present

## 2018-08-28 DIAGNOSIS — R5381 Other malaise: Secondary | ICD-10-CM | POA: Diagnosis not present

## 2018-08-28 DIAGNOSIS — Z4789 Encounter for other orthopedic aftercare: Secondary | ICD-10-CM | POA: Diagnosis not present

## 2018-08-28 DIAGNOSIS — R609 Edema, unspecified: Secondary | ICD-10-CM | POA: Diagnosis not present

## 2018-08-28 DIAGNOSIS — Z8042 Family history of malignant neoplasm of prostate: Secondary | ICD-10-CM

## 2018-08-28 DIAGNOSIS — R41841 Cognitive communication deficit: Secondary | ICD-10-CM | POA: Diagnosis not present

## 2018-08-28 DIAGNOSIS — Z833 Family history of diabetes mellitus: Secondary | ICD-10-CM

## 2018-08-28 HISTORY — PX: HIP CLOSED REDUCTION: SHX983

## 2018-08-28 LAB — CBC WITH DIFFERENTIAL/PLATELET
Abs Immature Granulocytes: 0 10*3/uL (ref 0.00–0.07)
Basophils Absolute: 0 10*3/uL (ref 0.0–0.1)
Basophils Relative: 0 %
Eosinophils Absolute: 0 10*3/uL (ref 0.0–0.5)
Eosinophils Relative: 0 %
HCT: 28.1 % — ABNORMAL LOW (ref 39.0–52.0)
Hemoglobin: 9.2 g/dL — ABNORMAL LOW (ref 13.0–17.0)
Lymphocytes Relative: 51 %
Lymphs Abs: 4.4 10*3/uL — ABNORMAL HIGH (ref 0.7–4.0)
MCH: 33.6 pg (ref 26.0–34.0)
MCHC: 32.7 g/dL (ref 30.0–36.0)
MCV: 102.6 fL — ABNORMAL HIGH (ref 80.0–100.0)
Monocytes Absolute: 0.1 10*3/uL (ref 0.1–1.0)
Monocytes Relative: 1 %
Neutro Abs: 4.2 10*3/uL (ref 1.7–7.7)
Neutrophils Relative %: 48 %
Platelets: 150 10*3/uL (ref 150–400)
RBC: 2.74 MIL/uL — ABNORMAL LOW (ref 4.22–5.81)
RDW: 13.6 % (ref 11.5–15.5)
WBC: 8.7 10*3/uL (ref 4.0–10.5)
nRBC: 0 % (ref 0.0–0.2)
nRBC: 0 /100 WBC

## 2018-08-28 LAB — COMPREHENSIVE METABOLIC PANEL
ALT: 24 U/L (ref 0–44)
AST: 37 U/L (ref 15–41)
Albumin: 2 g/dL — ABNORMAL LOW (ref 3.5–5.0)
Alkaline Phosphatase: 81 U/L (ref 38–126)
Anion gap: 13 (ref 5–15)
BUN: 18 mg/dL (ref 8–23)
CO2: 25 mmol/L (ref 22–32)
Calcium: 7.8 mg/dL — ABNORMAL LOW (ref 8.9–10.3)
Chloride: 93 mmol/L — ABNORMAL LOW (ref 98–111)
Creatinine, Ser: 0.95 mg/dL (ref 0.61–1.24)
GFR calc Af Amer: >60 mL/min (ref >60)
GFR calc non Af Amer: >60 mL/min (ref >60)
Glucose, Bld: 105 mg/dL — ABNORMAL HIGH (ref 70–99)
Potassium: 3.5 mmol/L (ref 3.5–5.1)
Sodium: 131 mmol/L — ABNORMAL LOW (ref 135–145)
Total Bilirubin: 1 mg/dL (ref 0.3–1.2)
Total Protein: 4.7 g/dL — ABNORMAL LOW (ref 6.5–8.1)

## 2018-08-28 SURGERY — CLOSED REDUCTION, HIP
Anesthesia: General | Site: Hip | Laterality: Left

## 2018-08-28 MED ORDER — MENTHOL 3 MG MT LOZG
1.0000 | LOZENGE | OROMUCOSAL | Status: DC | PRN
  Filled 2018-08-28: qty 9

## 2018-08-28 MED ORDER — PHENOL 1.4 % MT LIQD: 1.0000 | OROMUCOSAL | Status: DC | PRN

## 2018-08-28 MED ORDER — DOXYCYCLINE HYCLATE 100 MG PO TABS
100.0000 mg | ORAL_TABLET | Freq: Two times a day (BID) | ORAL | Status: DC
  Administered 2018-08-28 – 2018-09-01 (×8): 100 mg via ORAL
  Filled 2018-08-28 (×8): qty 1

## 2018-08-28 MED ORDER — ELVITEG-COBIC-EMTRICIT-TENOFAF 150-150-200-10 MG PO TABS
1.0000 | ORAL_TABLET | Freq: Every day | ORAL | Status: DC
  Administered 2018-08-29 – 2018-09-01 (×4): 1 via ORAL
  Filled 2018-08-28 (×6): qty 1

## 2018-08-28 MED ORDER — HYDROCODONE-ACETAMINOPHEN 7.5-325 MG PO TABS
ORAL_TABLET | ORAL | Status: AC
  Filled 2018-08-28: qty 1

## 2018-08-28 MED ORDER — SUCCINYLCHOLINE CHLORIDE 20 MG/ML IJ SOLN
INTRAMUSCULAR | Status: DC | PRN
  Administered 2018-08-28: 80 mg via INTRAVENOUS

## 2018-08-28 MED ORDER — BREXPIPRAZOLE 0.25 MG HALF TABLET
0.2500 mg | ORAL_TABLET | Freq: Every day | ORAL | Status: DC
  Administered 2018-08-29 – 2018-09-01 (×4): 0.25 mg via ORAL
  Filled 2018-08-28 (×4): qty 1

## 2018-08-28 MED ORDER — FUROSEMIDE 20 MG PO TABS
20.0000 mg | ORAL_TABLET | Freq: Two times a day (BID) | ORAL | Status: DC
  Administered 2018-08-29 – 2018-09-01 (×7): 20 mg via ORAL
  Filled 2018-08-28 (×7): qty 1

## 2018-08-28 MED ORDER — PREGABALIN 50 MG PO CAPS
200.0000 mg | ORAL_CAPSULE | Freq: Two times a day (BID) | ORAL | Status: DC
  Administered 2018-08-29 – 2018-09-01 (×7): 200 mg via ORAL
  Filled 2018-08-28 (×7): qty 4

## 2018-08-28 MED ORDER — PHENYLEPHRINE HCL (PRESSORS) 10 MG/ML IV SOLN
INTRAVENOUS | Status: DC | PRN
  Administered 2018-08-28: 120 ug via INTRAVENOUS

## 2018-08-28 MED ORDER — FAMOTIDINE 20 MG PO TABS
40.0000 mg | ORAL_TABLET | Freq: Every day | ORAL | Status: DC
  Administered 2018-08-29 – 2018-09-01 (×4): 40 mg via ORAL
  Filled 2018-08-28 (×4): qty 2

## 2018-08-28 MED ORDER — METOCLOPRAMIDE HCL 5 MG PO TABS: 5.0000 mg | ORAL_TABLET | Freq: Three times a day (TID) | ORAL | Status: DC | PRN

## 2018-08-28 MED ORDER — TRAMADOL HCL 50 MG PO TABS
50.0000 mg | ORAL_TABLET | Freq: Four times a day (QID) | ORAL | Status: DC
  Administered 2018-08-28 – 2018-09-01 (×14): 50 mg via ORAL
  Filled 2018-08-28 (×15): qty 1

## 2018-08-28 MED ORDER — HYDROCODONE-ACETAMINOPHEN 7.5-325 MG PO TABS
1.0000 | ORAL_TABLET | Freq: Once | ORAL | Status: AC | PRN
  Administered 2018-08-28: 17:00:00 1 via ORAL

## 2018-08-28 MED ORDER — DARUNAVIR ETHANOLATE 800 MG PO TABS
800.0000 mg | ORAL_TABLET | Freq: Every day | ORAL | Status: DC
  Administered 2018-08-29 – 2018-09-01 (×4): 800 mg via ORAL
  Filled 2018-08-28 (×5): qty 1

## 2018-08-28 MED ORDER — MORPHINE SULFATE (PF) 2 MG/ML IV SOLN: 0.5000 mg | INTRAVENOUS | Status: DC | PRN

## 2018-08-28 MED ORDER — ONDANSETRON HCL 4 MG/2ML IJ SOLN
INTRAMUSCULAR | Status: DC | PRN
  Administered 2018-08-28: 4 mg via INTRAVENOUS

## 2018-08-28 MED ORDER — FOLIC ACID 1 MG PO TABS
1.0000 mg | ORAL_TABLET | Freq: Every day | ORAL | Status: DC
  Administered 2018-08-29 – 2018-09-01 (×4): 1 mg via ORAL
  Filled 2018-08-28 (×4): qty 1

## 2018-08-28 MED ORDER — METOCLOPRAMIDE HCL 5 MG/ML IJ SOLN: 5.0000 mg | Freq: Three times a day (TID) | INTRAMUSCULAR | Status: DC | PRN

## 2018-08-28 MED ORDER — DEXAMETHASONE SODIUM PHOSPHATE 10 MG/ML IJ SOLN
INTRAMUSCULAR | Status: DC | PRN
  Administered 2018-08-28: 10 mg via INTRAVENOUS

## 2018-08-28 MED ORDER — LACTATED RINGERS IV SOLN
INTRAVENOUS | Status: DC | PRN
  Administered 2018-08-28: 16:00:00 via INTRAVENOUS

## 2018-08-28 MED ORDER — ONDANSETRON HCL 4 MG PO TABS: 4.0000 mg | ORAL_TABLET | Freq: Four times a day (QID) | ORAL | Status: DC | PRN

## 2018-08-28 MED ORDER — SUGAMMADEX SODIUM 500 MG/5ML IV SOLN
INTRAVENOUS | Status: DC | PRN
  Administered 2018-08-28 (×2): 200 mg via INTRAVENOUS

## 2018-08-28 MED ORDER — FERROUS SULFATE 325 (65 FE) MG PO TABS
325.0000 mg | ORAL_TABLET | Freq: Three times a day (TID) | ORAL | Status: DC
  Administered 2018-08-28 – 2018-09-01 (×11): 325 mg via ORAL
  Filled 2018-08-28 (×11): qty 1

## 2018-08-28 MED ORDER — HYDROCODONE-ACETAMINOPHEN 7.5-325 MG PO TABS
1.0000 | ORAL_TABLET | ORAL | Status: DC | PRN
  Administered 2018-08-29: 2 via ORAL
  Filled 2018-08-28: qty 1
  Filled 2018-08-28: qty 2

## 2018-08-28 MED ORDER — FLEET ENEMA 7-19 GM/118ML RE ENEM: 1.0000 | ENEMA | Freq: Once | RECTAL | Status: DC | PRN

## 2018-08-28 MED ORDER — HYDROCODONE-ACETAMINOPHEN 5-325 MG PO TABS: 1.0000 | ORAL_TABLET | ORAL | Status: DC | PRN

## 2018-08-28 MED ORDER — HYDROMORPHONE HCL 2 MG PO TABS: 2.0000 mg | ORAL_TABLET | Freq: Four times a day (QID) | ORAL | Status: DC | PRN

## 2018-08-28 MED ORDER — BREXPIPRAZOLE 0.25 MG PO TABS: 0.2500 mg | ORAL_TABLET | Freq: Every day | ORAL | Status: DC

## 2018-08-28 MED ORDER — LIDOCAINE 2% (20 MG/ML) 5 ML SYRINGE
INTRAMUSCULAR | Status: DC | PRN
  Administered 2018-08-28: 60 mg via INTRAVENOUS

## 2018-08-28 MED ORDER — PROPOFOL 10 MG/ML IV BOLUS
INTRAVENOUS | Status: AC
  Filled 2018-08-28: qty 20

## 2018-08-28 MED ORDER — FENTANYL CITRATE (PF) 100 MCG/2ML IJ SOLN
INTRAMUSCULAR | Status: AC
  Filled 2018-08-28: qty 2

## 2018-08-28 MED ORDER — FENTANYL CITRATE (PF) 100 MCG/2ML IJ SOLN
50.0000 ug | Freq: Once | INTRAMUSCULAR | Status: AC
  Administered 2018-08-28: 50 ug via INTRAVENOUS
  Filled 2018-08-28: qty 2

## 2018-08-28 MED ORDER — FENTANYL CITRATE (PF) 100 MCG/2ML IJ SOLN
INTRAMUSCULAR | Status: DC | PRN
  Administered 2018-08-28 (×2): 50 ug via INTRAVENOUS

## 2018-08-28 MED ORDER — ESCITALOPRAM OXALATE 20 MG PO TABS
20.0000 mg | ORAL_TABLET | Freq: Every day | ORAL | Status: DC
  Administered 2018-08-29 – 2018-09-01 (×4): 20 mg via ORAL
  Filled 2018-08-28 (×4): qty 1

## 2018-08-28 MED ORDER — ATORVASTATIN CALCIUM 10 MG PO TABS
10.0000 mg | ORAL_TABLET | Freq: Every day | ORAL | Status: DC
  Administered 2018-08-28 – 2018-08-31 (×4): 10 mg via ORAL
  Filled 2018-08-28 (×4): qty 1

## 2018-08-28 MED ORDER — VALACYCLOVIR HCL 500 MG PO TABS
500.0000 mg | ORAL_TABLET | Freq: Every day | ORAL | Status: DC
  Administered 2018-08-29 – 2018-09-01 (×4): 500 mg via ORAL
  Filled 2018-08-28 (×4): qty 1

## 2018-08-28 MED ORDER — ONDANSETRON HCL 4 MG/2ML IJ SOLN: 4.0000 mg | Freq: Four times a day (QID) | INTRAMUSCULAR | Status: DC | PRN

## 2018-08-28 MED ORDER — SODIUM CHLORIDE 0.9 % IV SOLN
INTRAVENOUS | Status: DC
  Administered 2018-08-28: 18:00:00 via INTRAVENOUS

## 2018-08-28 MED ORDER — DEXTROMETHORPHAN POLISTIREX ER 30 MG/5ML PO SUER: 30.0000 mg | Freq: Two times a day (BID) | ORAL | Status: DC

## 2018-08-28 MED ORDER — PROPOFOL 10 MG/ML IV BOLUS
INTRAVENOUS | Status: DC | PRN
  Administered 2018-08-28: 180 mg via INTRAVENOUS

## 2018-08-28 MED ORDER — DOCUSATE SODIUM 100 MG PO CAPS
100.0000 mg | ORAL_CAPSULE | Freq: Two times a day (BID) | ORAL | Status: DC
  Administered 2018-08-28 – 2018-09-01 (×8): 100 mg via ORAL
  Filled 2018-08-28 (×8): qty 1

## 2018-08-28 MED ORDER — FENTANYL CITRATE (PF) 100 MCG/2ML IJ SOLN
25.0000 ug | INTRAMUSCULAR | Status: DC | PRN
  Administered 2018-08-28: 25 ug via INTRAVENOUS

## 2018-08-28 MED ORDER — DIPHENHYDRAMINE HCL 12.5 MG/5ML PO ELIX: 12.5000 mg | ORAL_SOLUTION | ORAL | Status: DC | PRN

## 2018-08-28 MED ORDER — EDOXABAN TOSYLATE 30 MG PO TABS
30.0000 mg | ORAL_TABLET | ORAL | Status: DC
  Administered 2018-08-28 – 2018-08-31 (×4): 30 mg via ORAL
  Filled 2018-08-28 (×5): qty 1

## 2018-08-28 MED ORDER — SULFAMETHOXAZOLE-TRIMETHOPRIM 800-160 MG PO TABS
1.0000 | ORAL_TABLET | Freq: Two times a day (BID) | ORAL | Status: DC
  Administered 2018-08-28 – 2018-09-01 (×8): 1 via ORAL
  Filled 2018-08-28 (×8): qty 1

## 2018-08-28 MED ORDER — ASPIRIN EC 325 MG PO TBEC: 325.0000 mg | DELAYED_RELEASE_TABLET | Freq: Every day | ORAL | Status: DC

## 2018-08-28 MED ORDER — NITROGLYCERIN 0.4 MG SL SUBL: 0.4000 mg | SUBLINGUAL_TABLET | SUBLINGUAL | Status: DC | PRN

## 2018-08-28 MED ORDER — ROCURONIUM BROMIDE 50 MG/5ML IV SOSY
PREFILLED_SYRINGE | INTRAVENOUS | Status: DC | PRN
  Administered 2018-08-28: 10 mg via INTRAVENOUS

## 2018-08-28 MED ORDER — PREDNISONE 5 MG PO TABS
5.0000 mg | ORAL_TABLET | Freq: Two times a day (BID) | ORAL | Status: DC
  Administered 2018-08-28 – 2018-09-01 (×8): 5 mg via ORAL
  Filled 2018-08-28 (×9): qty 1

## 2018-08-28 MED ORDER — FENTANYL CITRATE (PF) 250 MCG/5ML IJ SOLN
INTRAMUSCULAR | Status: AC
  Filled 2018-08-28: qty 5

## 2018-08-28 MED ORDER — POLYETHYLENE GLYCOL 3350 17 G PO PACK: 17.0000 g | PACK | Freq: Every day | ORAL | Status: DC | PRN

## 2018-08-28 MED ORDER — BISACODYL 5 MG PO TBEC
5.0000 mg | DELAYED_RELEASE_TABLET | Freq: Every day | ORAL | Status: DC | PRN
  Administered 2018-09-01: 5 mg via ORAL
  Filled 2018-08-28: qty 1

## 2018-08-28 MED ORDER — LEFLUNOMIDE 20 MG PO TABS
20.0000 mg | ORAL_TABLET | Freq: Every day | ORAL | Status: DC
  Administered 2018-08-29 – 2018-09-01 (×4): 20 mg via ORAL
  Filled 2018-08-28 (×4): qty 1

## 2018-08-28 SURGICAL SUPPLY — 6 items
DRSG MEPILEX BORDER 4X8 (GAUZE/BANDAGES/DRESSINGS) ×2 IMPLANT
GAUZE SPONGE 4X4 12PLY STRL (GAUZE/BANDAGES/DRESSINGS) ×2 IMPLANT
IMMOBILIZER KNEE 24 THIGH 36 (MISCELLANEOUS) ×1 IMPLANT
IMMOBILIZER KNEE 24 UNIV (MISCELLANEOUS) ×2
PILLOW ABDUCTION MEDIUM (MISCELLANEOUS) ×2 IMPLANT
SOL PREP PROV IODINE SCRUB 4OZ (MISCELLANEOUS) ×2 IMPLANT

## 2018-08-28 NOTE — Discharge Instructions (Addendum)
Closed Reduction for Prosthetic Hip Joint Dislocation, Care After This sheet gives you information about how to care for yourself after your procedure. Your health care provider may also give you more specific instructions. If you have problems or questions, contact your health care provider. What can I expect after the procedure? After the procedure, it is common to have:  Hip pain for about a week. The pain should lessen each day. It may take a few weeks to recover completely.  Difficulty walking or doing your usual daily activities. You may need to use a device to help you move around (assistive device), such as crutches, for a period of time. Follow these instructions at home: If you have a splint:  Wear the splint as told by your health care provider. Remove it only as told by your health care provider.  Loosen the splint if your toes tingle, become numb, or turn cold and blue.  Keep the splint clean.  If the splint is not waterproof: ? Do not let it get wet. ? Ask your health care provider if you may remove the splint before bathing. If you may not remove it, cover it with a watertight covering when you take a bath or a shower. If you have a cast:  Do not stick anything inside the cast to scratch your skin. Doing that raises your risk of infection.  Check the skin around the cast every day. Tell your health care provider about any concerns.  You may put lotion on dry skin around the edges of the cast. Do not put lotion on the skin underneath the cast.  Keep the cast clean.  If the cast is not waterproof: ? Do not let it get wet. ? Cover it with a watertight covering when you take a bath or a shower. Managing pain, stiffness, and swelling   If directed, put ice on the affected area: ? If you have a removable splint or brace, remove it as told by your health care provider. ? Put ice in a plastic bag. ? Place a towel between your skin and the bag or between your cast and the  bag. ? Leave the ice on for 20 minutes, 2?3 times a day.  Move your toes often to avoid stiffness and to lessen swelling. Driving  Do not drive until your health care provider approves. Ask your health care provider when it is safe for you to drive.  Do not drive or use heavy machinery while taking prescription pain medicine. Activity   Ask your health care provider what activities are safe for you. If any activity causes pain or discomfort, stop the activity. Ask for help with activities that are difficult.  Do not use your injured leg to support (bear) your body weight until your health care provider says that you can. Follow instructions about how much weight you may safely support on your affected leg (weight-bearing restrictions).  Use crutches as told by your health care provider. When you stop using your crutches, walk and move slowly until you feel more stable.  Do not lift anything that is heavier than 10 lb (4.5 kg), or the limit that you are told, until your health care provider says that it is safe.  Do exercises as told by your health care provider or physical therapist.  Follow all hip dislocation precautions in any position (standing, sitting, or lying) as told by your health care provider. These precautions may include the following: ? Do not cross your legs  at the knees. To remind yourself about this, you may keep a pillow between your legs while lying in bed. ? Do not bring your knees higher than the level of your hips. ? Do not bend at the waist more than 90 degrees (hip flexion), such as picking up something from the floor while sitting in a chair. ? Avoid twisting at your waist. To help with this, place frequently used items near the hand that you use more often (dominant side). ? Avoid rotating the toes of your affected leg inward. Keep your toes pointing straight ahead. ? Use a raised toilet seat, and avoid sitting in low chairs. General instructions  Take  over-the-counter and prescription medicines only as told by your health care provider.  If you are taking prescription pain medicine, take actions to prevent or treat constipation. Your health care provider may recommend that you: ? Drink enough fluid to keep your urine pale yellow. ? Eat foods that are high in fiber, such as fresh fruits and vegetables, whole grains, and beans. ? Limit foods that are high in fat and processed sugars, such as fried or sweet foods. ? Take an over-the-counter or prescription medicine for constipation.  Do not use any products that contain nicotine or tobacco, such as cigarettes and e-cigarettes. These can delay healing. If you need help quitting, ask your health care provider.  Keep all follow-up visits as told by your health care provider. This is important. Contact a health care provider if:  It is not getting easier for you to walk or move.  Your leg or the back of your lower leg (calf) swells or feels tender.  You have swelling that gets worse or is severe.  Any part of your hip or leg feels numb, tingles, burns, or stings.  You have pain that does not get better with medicine.  Your cast or splint is damaged or has gotten wet and is not waterproof. Get help right away if:  You think you have dislocated your hip again.  You cannot move your leg.  You have difficulty breathing.  You have chest pain. Summary  Use crutches as told by your health care provider. When you stop using your crutches, move slowly at first.  Do not use your injured leg to support your body weight until your health care provider says that you can. Follow instructions about how much weight you may safely support on your affected leg (weight-bearing restrictions).  Keep all follow-up visits as told by your health care provider. This is important. This information is not intended to replace advice given to you by your health care provider. Make sure you discuss any  questions you have with your health care provider. Document Released: 11/15/2004 Document Revised: 02/25/2017 Document Reviewed: 02/25/2017 Elsevier Interactive Patient Education  2019 [place]. Use the left hip abduction, ER brace with flexion block at all times for 6 weeks. Do not rotate the left leg inwards and avoid flexion of the left hip Greater than 90 degrees. Keep the hip abducted or away from the midline to increase the reduction of the hip and decrease risk of redislocation.   The current hip dressing that is on should be left in place until his outpatient follow-up with orthopedics in 1 week.

## 2018-08-28 NOTE — ED Triage Notes (Addendum)
Patient transferred via [place] from [facility] for multiple hip dislocations following hip replacement surgery one week ago. Surgical site intact with old drainage noted. Patient alert and oriented. [facility] gave the patient 50 mcg of Fentanyl about 45 minutes ago.

## 2018-08-28 NOTE — Progress Notes (Signed)
Orthopedic Tech Progress Note Patient Details:  [name] 1950/10/14 [id number]  Patient ID: [name], male   DOB: 1950-06-24, 68 y.o.   MRN: [id number]   [name] 08/28/2018, 6:05 PMCalled [employer] for left  Hip brace.

## 2018-08-28 NOTE — Transfer of Care (Signed)
Immediate Anesthesia Transfer of Care Note  Patient: [name]  Procedure(s) Performed: CLOSED REDUCTION HIP FOR RECURRENT DISLOCATION AND DRESSING CHANGE (Left Hip)  Patient Location: PACU  Anesthesia Type:General  Level of Consciousness: awake, alert  and oriented  Airway & Oxygen Therapy: Patient Spontanous Breathing and Patient connected to nasal cannula oxygen  Post-op Assessment: Report given to RN and Post -op Vital signs reviewed and stable  Post vital signs: Reviewed and stable  Last Vitals:  Vitals Value Taken Time  BP    Temp    Pulse    Resp    SpO2      Last Pain:  Vitals:   08/28/18 1459  PainSc: 7       Patients Stated Pain Goal: 0 (04/59/97 7414)  Complications: No apparent anesthesia complications

## 2018-08-28 NOTE — Op Note (Signed)
08/28/2018  4:38 PM  PATIENT:  [name]  68 y.o. male  MRN: [id number]  OPERATIVE REPORT  PRE-OPERATIVE DIAGNOSIS:  Hip Dislocation  POST-OPERATIVE DIAGNOSIS:  Hip Dislocation  PROCEDURE:  Procedure(s): CLOSED REDUCTION HIP FOR RECURRENT DISLOCATION AND DRESSING CHANGE    SURGEON:  Jessy Oto, MD     ANESTHESIA:  General,    COMPLICATIONS:  None.     COMPONENTS:   PROCEDURE: The patient was met in the holding area, and the appropriate left hip identified and marked with an "X" and my initials.  The patient was then transported to OR and remained on the transport gurney in a supine position. The patient was then placed under  general anesthesia without difficulty. Standard timeout protocol was carried out identifying the side of procedure. Following this then the left leg  was manipulated by performing longitudinally directed traction and internal and external rotation. The leg lengths returned to normal however C-arm demonstrated the left femoral head implant to be perched on the superolateral aspect of the acetabular cup. The left hip was then flexed to 70-80 degrees and the hip rotated internally and then externally and abducted with a palpable and audible cluck The left hip reduced. The tendency to dislocation was at minimal internal rotation and about 70-80 degrees of flexion.  A 22 inch knee immobilizer and an abduction foam pad then placed. A final C-arm showed the left hip arthroplasty reduced on AP view. The left hip dressing was then changed with painting with a betadiene swab and a mepilex dressing applied. There was mild hematoma over the lower half of the anterior hip incision but no drainage with expression.All instrument and sponge counts were correct. Patient was then reactivated extubated and returned to the recovery room in satisfactory condition.     Basil Dess  08/28/2018, 4:38 PM

## 2018-08-28 NOTE — Progress Notes (Signed)
Vendor brace placed by representative of [employer].

## 2018-08-28 NOTE — Brief Op Note (Signed)
08/28/2018  4:35 PM  PATIENT:  [name]  68 y.o. male  PRE-OPERATIVE DIAGNOSIS:  Hip Dislocation  POST-OPERATIVE DIAGNOSIS:  Hip Dislocation  PROCEDURE:  Procedure(s): CLOSED REDUCTION HIP FOR RECURRENT DISLOCATION AND DRESSING CHANGE (Left)  SURGEON:  Surgeon(s) and Role:    * Jessy Oto, MD - Primary   ANESTHESIA:   general  EBL:  0 mL   BLOOD ADMINISTERED:none  DRAINS: none   LOCAL MEDICATIONS USED:  NONE  SPECIMEN:  No Specimen  DISPOSITION OF SPECIMEN:  N/A  COUNTS:  YES  TOURNIQUET:  * No tourniquets in log *  DICTATION: .Dragon Dictation  PLAN OF CARE: Admit to inpatient   PATIENT DISPOSITION:  PACU - hemodynamically stable.   Delay start of Pharmacological VTE agent (>24hrs) due to surgical blood loss or risk of bleeding: no

## 2018-08-28 NOTE — Anesthesia Procedure Notes (Signed)
Procedure Name: Intubation Date/Time: 08/28/2018 4:19 PM Performed by: Trinna Post., CRNA Pre-anesthesia Checklist: Patient identified, Emergency Drugs available, Suction available, Patient being monitored and Timeout performed Patient Re-evaluated:Patient Re-evaluated prior to induction Oxygen Delivery Method: Circle system utilized Preoxygenation: Pre-oxygenation with 100% oxygen Induction Type: IV induction, Rapid sequence and Cricoid Pressure applied Laryngoscope Size: Mac and 4 Grade View: Grade II Tube type: Oral Tube size: 7.5 mm Number of attempts: 1 Airway Equipment and Method: Stylet Placement Confirmation: ETT inserted through vocal cords under direct vision,  positive ETCO2 and breath sounds checked- equal and bilateral Secured at: 22 cm Tube secured with: Tape Dental Injury: Teeth and Oropharynx as per pre-operative assessment

## 2018-08-28 NOTE — Anesthesia Preprocedure Evaluation (Addendum)
Anesthesia Evaluation  Patient identified by MRN, date of birth, ID band Patient awake    Reviewed: Allergy & Precautions, NPO status , Patient's Chart, lab work & pertinent test results  History of Anesthesia Complications Negative for: history of anesthetic complications  Airway Mallampati: II  TM Distance: >3 FB Neck ROM: Full    Dental  (+) Teeth Intact, Dental Advisory Given   Pulmonary neg pulmonary ROS,    breath sounds clear to auscultation       Cardiovascular hypertension, + CAD, + Past MI and + Peripheral Vascular Disease   Rhythm:Regular Rate:Normal  MI 2010   Neuro/Psych TIA Neuromuscular disease CVA    GI/Hepatic hiatal hernia, PUD, GERD  ,(+) Hepatitis -  Endo/Other  Chronic steroids  Renal/GU      Musculoskeletal  (+) Arthritis , Rheumatoid disorders,  Fibromyalgia -  Abdominal   Peds  Hematology  (+) anemia , HIV, CLL (chronic lymphoblastic leukemia)   Anesthesia Other Findings   Reproductive/Obstetrics                            Anesthesia Physical Anesthesia Plan  ASA: III  Anesthesia Plan: General   Post-op Pain Management:    Induction: Intravenous and Rapid sequence  PONV Risk Score and Plan: 2 and Treatment may vary due to age or medical condition  Airway Management Planned: Oral ETT  Additional Equipment: None  Intra-op Plan:   Post-operative Plan: Extubation in OR  Informed Consent: I have reviewed the patients History and Physical, chart, labs and discussed the procedure including the risks, benefits and alternatives for the proposed anesthesia with the patient or authorized representative who has indicated his/her understanding and acceptance.     Dental advisory given  Plan Discussed with: CRNA and Surgeon  Anesthesia Plan Comments:        Anesthesia Quick Evaluation

## 2018-08-28 NOTE — H&P (Addendum)
TOTAL HIP ADMISSION H&P  Patient is admitted for left total hip arthroplasty.  Subjective:  Chief Complaint: left hip pain  HPI: [name], 68 y.o. male, has a history of pain and functional disability in the left hip(s) due to avascular necrosis with femoral head collapse and patient has failed non-surgical conservative treatments for greater than 12 weeks to include corticosteriod injections, use of assistive devices and activity modification.  Onset of symptoms was abrupt starting 4-5 months ago with gradually worsening course since that time.The patient noted no past surgery on the left hip(s).  Patient currently rates pain in the left hip at 10 out of 10 with activity. Patient has night pain, worsening of pain with activity and weight bearing, trendelenberg gait, pain that interfers with activities of daily living and pain with passive range of motion. Patient has evidence of subchondral cysts and edema in the femoral head and neck with femoral head subchondral fracture by imaging studies. This condition presents safety issues increasing the risk of falls.  There is no current active infection. Underwent initial left total hip replacement 08/13/2018. Then on 4/5 underwent a closed reduction of an acute dislocation of the left total hip replacement. Dr. Ninfa Linden then performed a revision arthroplasty of the left total hip replacement on 4/7. Patient was discharged home. This AM he reports that he was at home and sitting on a tub chair bathing when the left leg turned in and came out of place. Reports that there was some drainage from the incision site, in small amounts. Taken via ambulance to former [facility], now [facility]. Transported to [facility] for reduction and treatment.   Patient Active Problem List   Diagnosis Date Noted  . Recurrent dislocation of hip joint prosthesis ([place]) 08/28/2018    Priority: High    Class: Acute  . Pneumonia of both lungs due to infectious  organism 08/24/2018  . Status post total replacement of left hip 08/13/2018  . Avascular necrosis of bone of hip, left ([place]) 08/09/2018  . Eczema 07/01/2018  . Candida rash of groin 07/01/2018  . Tinea corporis 01/22/2018  . Depression with anxiety 01/14/2018  . 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 Carotid artery disease ([place]) 11/13/2016  . Stenosis of carotid artery 11/13/2016  . Spinal stenosis of thoracolumbar region 07/02/2016  . ILD (interstitial lung disease) ([place]) 09/13/2015  . Immunocompromised state ([place]) 09/13/2015  . PCP (pneumocystis jiroveci pneumonia) ([place]) 06/18/2015  . Postinflammatory pulmonary fibrosis ([place]) 05/11/2015  . GERD (gastroesophageal reflux disease)   . Hereditary and idiopathic peripheral neuropathy 09/08/2013  . Erosive esophagitis 12/28/2012  . Allergic rhinitis, cause unspecified 04/28/2012  . Long term current use of anticoagulant therapy 02/03/2012  . Hypertension   . DVT, lower extremity, recurrent ([place])   . Gout   . Carotid artery occlusion   . Hyperlipidemia with target LDL less than 100   . HIV infection ([place]) 04/08/2011  . Arthritis, rheumatoid ([place]) 04/08/2011  . Chronic lymphoblastic leukemia 04/08/2011  . Impotence of organic origin 04/02/2011  . CAD (coronary artery disease) of artery bypass graft 02/14/2009  . Herpes genitalis 06/22/2008   Past Medical History:  Diagnosis Date  . Allergy   . Anxiety   . Carotid artery occlusion    40-60% right ICA stenosis (09/2008)  . Cataract   . Chronic back pain   . CLL (chronic lymphoblastic leukemia) dx 2010   Followed at [facility] q78mo, no current therapy   . Clotting disorder ([place])   .  Coronary artery disease 2010   s/p CABG '10, sees Dr. Percival Spanish  . Depression   . Diverticulosis   . DVT, lower extremity, recurrent ([place]) 2008, 2009   LLE, chronic anticoag since 2009  . Esophagitis   . Fibromyalgia   . Gallstones   . GERD (gastroesophageal reflux disease)   . Gout   .  Gynecomastia, male   . H/O hiatal hernia 2008   surgery  . Hemorrhoids   . Hepatitis A yrs ago  . HIV infection ([place]) dx 1993  . Hypertension   . Impotence of organic origin   . Myocardial infarction ([place]) 2010    x 2  . Neuromuscular disorder ([facility])    neuropathy  . Osteoarthritis, knee    s/p B TKA  . Osteoporosis   . Pneumonia mrach, may, july 2017  . Rheumatoid arthritis(714.0) dx 2010   MTX, follows with rheum  . Seasonal allergies   . Secondary syphilis 07/24/14 dx   s/p 2 wks doxy  . Status post dilation of esophageal narrowing   . Stroke ([place]) [place]   . TIA (transient ischemic attack) 1997   mild residual L mouth droop  . Tubular adenoma of colon     Past Surgical History:  Procedure Laterality Date  . ANTERIOR HIP REVISION Left 08/17/2018   Procedure: ANTERIOR LEFT HIP REVISION;  Surgeon: Mcarthur Rossetti, MD;  Location: [facility];  Service: Orthopedics;  Laterality: Left;  . CHOLECYSTECTOMY    . COLONOSCOPY WITH PROPOFOL N/A 12/28/2012   Procedure: COLONOSCOPY WITH PROPOFOL;  Surgeon: Jerene Bears, MD;  Location: [facility];  Service: Gastroenterology;  Laterality: N/A;  . CORONARY ARTERY BYPASS GRAFT  2010   triple bypass  . ESOPHAGOGASTRODUODENOSCOPY (EGD) WITH PROPOFOL N/A 12/28/2012   Procedure: ESOPHAGOGASTRODUODENOSCOPY (EGD) WITH PROPOFOL;  Surgeon: Jerene Bears, MD;  [facility];  Service: Gastroenterology;  Laterality: N/A;  . ESOPHAGOGASTRODUODENOSCOPY (EGD) WITH PROPOFOL N/A 03/15/2013   Procedure: ESOPHAGOGASTRODUODENOSCOPY (EGD) WITH PROPOFOL;  Surgeon: Jerene Bears, MD;  [facility];  Service: Gastroenterology;  Laterality: N/A;  . ESOPHAGOGASTRODUODENOSCOPY (EGD) WITH PROPOFOL N/A 02/07/2016   Procedure: ESOPHAGOGASTRODUODENOSCOPY (EGD) WITH PROPOFOL;  Surgeon: Jerene Bears, MD;  [facility]: [facility];  Service: Gastroenterology;  Laterality: N/A;  . HARDWARE REMOVAL N/A 07/02/2012   Procedure: HARDWARE REMOVAL;  Surgeon:  Elaina Hoops, MD;  Location: [facility];  Service: Neurosurgery;  Laterality: N/A;  . HIATAL HERNIA REPAIR     wrap  . HIP CLOSED REDUCTION Left 08/15/2018   Procedure: CLOSED REDUCTION HIP;  Surgeon: Newt Minion, MD;  Location: [facility];  Service: Orthopedics;  Laterality: Left;  . INGUINAL HERNIA REPAIR Bilateral   . JOINT REPLACEMENT Left 1999  . KNEE ARTHROPLASTY  07/22/2011   Procedure: COMPUTER ASSISTED TOTAL KNEE ARTHROPLASTY;  Surgeon: Meredith Pel, MD;  Location: [facility];  Service: Orthopedics;  Laterality: Right;  Right total knee arthroplasty  . MANDIBLE SURGERY Bilateral    tmj  . REPLACEMENT TOTAL KNEE Bilateral   . ring around testicle hernia reapir  184 and 1986   x 2  . ROTATOR CUFF REPAIR Right   . SHOULDER SURGERY Left   . SPINE SURGERY  2010   "rod and screws", "failed", lopwer spine,   . stent to heart x 1  2010  . TONSILLECTOMY    . TOTAL HIP ARTHROPLASTY Left 08/13/2018   Procedure: LEFT TOTAL HIP ARTHROPLASTY ANTERIOR APPROACH;  Surgeon: Mcarthur Rossetti,  MD;  Location: [facility];  Service: Orthopedics;  Laterality: Left;  . UMBILICAL HERNIA REPAIR     x 1  . varicose vein     stripping  . VIDEO BRONCHOSCOPY Bilateral 10/16/2015   Procedure: VIDEO BRONCHOSCOPY WITHOUT FLUORO;  Surgeon: Brand Males, MD;  Location: [facility];  Service: Cardiopulmonary;  Laterality: Bilateral;    No current facility-administered medications for this encounter.    Current Outpatient Medications  Medication Sig Dispense Refill Last Dose  . atorvastatin (LIPITOR) 10 MG tablet Take 1 tablet (10 mg total) by mouth at bedtime. 30 tablet 11 08/12/2018 at Unknown time  . clindamycin (CLEOCIN) 150 MG capsule Take 600 mg by mouth See admin instructions. Take 4 capsules (600 mg) by mouth 1 hour prior to dental appointments.  2 More than a month at Unknown time  . darunavir (PREZISTA) 800 MG tablet TAKE 1 TABLET (800 MG TOTAL) BY MOUTH DAILY. 90 tablet 0 08/13/2018 at Unknown time  .  dextromethorphan (DELSYM) 30 MG/5ML liquid Take 5 mLs (30 mg total) by mouth 2 (two) times daily. 148 mL 1   . doxycycline (VIBRA-TABS) 100 MG tablet Take 1 tablet (100 mg total) by mouth 2 (two) times daily. 30 tablet 0   . elvitegravir-cobicistat-emtricitabine-tenofovir (GENVOYA) 150-150-200-10 MG TABS tablet Take 1 tablet by mouth daily with breakfast. 90 tablet 0 08/13/2018 at Unknown time  . escitalopram (LEXAPRO) 20 MG tablet Take 20 mg by mouth daily.   08/13/2018 at Unknown time  . famotidine (PEPCID) 40 MG tablet TAKE 1 TABLET BY MOUTH TWICE DAILY FOR 2 MONTHS (Patient taking differently: Take 40 mg by mouth daily. ) 180 tablet 0 08/13/2018 at Unknown time  . folic acid (FOLVITE) 1 MG tablet Take 1 mg by mouth daily.  11 08/12/2018 at Unknown time  . furosemide (LASIX) 20 MG tablet Take 1 tablet (20 mg total) by mouth 2 (two) times daily. 90 tablet 3 08/12/2018 at Unknown time  . HYDROmorphone (DILAUDID) 2 MG tablet Take 1 tablet (2 mg total) by mouth every 6 (six) hours as needed for severe pain. 30 tablet 0   . leflunomide (ARAVA) 20 MG tablet Take 20 mg by mouth daily.    08/13/2018 at Unknown time  . nitroGLYCERIN (NITROSTAT) 0.4 MG SL tablet Place 1 tablet (0.4 mg total) under the tongue every 5 (five) minutes as needed for chest pain. 25 tablet 2 Unknown  . predniSONE (DELTASONE) 5 MG tablet Take 5 mg by mouth 2 (two) times daily.   1 08/13/2018 at Unknown time  . pregabalin (LYRICA) 200 MG capsule TAKE 1 CAPSULE BY MOUTH TWICE A DAY (Patient taking differently: Take 200 mg by mouth 2 (two) times daily. ) 180 capsule 1 08/13/2018 at Unknown time  . REXULTI 0.25 MG TABS Take 0.25 mg by mouth daily.   2 08/13/2018 at Unknown time  . SAVAYSA 30 MG TABS tablet TAKE 1 TABLET (30 MG TOTAL) BY MOUTH DAILY. 30 tablet 5 Past Week at Unknown time  . sulfamethoxazole-trimethoprim (BACTRIM DS,SEPTRA DS) 800-160 MG tablet TAKE 1 TABLET BY MOUTH EVERY 12 (TWELVE) HOURS. 60 tablet 3 08/13/2018 at Unknown time  .  valACYclovir (VALTREX) 500 MG tablet Take 1 tablet (500 mg total) by mouth daily. 90 tablet 3 08/13/2018 at Unknown time   Allergies  Allergen Reactions  . Golimumab Anaphylaxis    simponi aria  . Orencia [Abatacept] Anaphylaxis  . Other Anaphylaxis and Hives    Pecan  . Peanut-Containing Drug Products Anaphylaxis, Hives  and Swelling    Swelling of throat  . Morphine Other (See Comments)    REACTION: severe headache  . Oxycodone-Acetaminophen Other (See Comments)    REACTION: headache  . Penicillins Rash and Other (See Comments)    FLUSHED, RED  Has patient had a PCN reaction causing immediate rash, facial/tongue/throat swelling, SOB or lightheadedness with hypotension: #  #  #  YES  #  #  #  Has patient had a PCN reaction causing severe rash involving mucus membranes or skin necrosis: No Has patient had a PCN reaction that required hospitalization No Has patient had a PCN reaction occurring within the last 10 years: No If all of the above answers are "NO", then may proceed with Cephalosporin use  . Promethazine Hcl Other (See Comments)    REACTION: makes him feel drunk at higher strengths    Social History   Tobacco Use  . Smoking status: Never Smoker  . Smokeless tobacco: Never Used  . Tobacco comment: occ wine  Substance Use Topics  . Alcohol use: Yes    Comment: occasional wine- 1-2 per week    Family History  Problem Relation Age of Onset  . Breast cancer Mother   . Hypertension Mother   . Hyperlipidemia Mother   . Diabetes Mother   . Prostate cancer Father   . Colon polyps Father   . Hyperlipidemia Father   . Crohn's disease Paternal Aunt   . Diabetes Maternal Grandmother   . Diabetes Brother        x 3  . Heart attack Brother   . Heart disease Brother        x 3  . Heart attack Brother   . Hyperlipidemia Brother        x 3  . Alcohol abuse Daughter   . Drug abuse Daughter   . Asthma Brother   . CVA Brother   . Colon cancer Neg Hx   . Esophageal cancer  Neg Hx   . Rectal cancer Neg Hx   . Stomach cancer Neg Hx      Review of Systems  All other systems reviewed and are negative.   Objective:  Physical Exam  Constitutional: He is oriented to person, place, and time. He appears well-developed and well-nourished.  HENT:  Head: Normocephalic and atraumatic.  Eyes: Pupils are equal, round, and reactive to light. EOM are normal.  Neck: Normal range of motion. Neck supple.  Cardiovascular: Normal rate.  Respiratory: Effort normal.  GI: Soft.  Musculoskeletal:     Left hip:The left leg is shortened, externally rotated and flexed at the hip and left knee. Swelling of the left leg from the left hip downwards. DP and PT are 1+/ Normal motor and sensation.  Neurological: He is alert and oriented to person, place, and time.  Skin: Skin is warm and dry.  Psychiatric: He has a normal mood and affect.  Vital signs in last 24 hours: Pulse Rate:  [100-106] 105 (04/18 1512) Resp:  [14-23] 14 (04/18 1512) BP: (97-126)/(68-77) 111/68 (04/18 1512) SpO2:  [97 %-100 %] 99 % (04/18 1512) Weight:  [86.2 kg] 86.2 kg (04/18 1512)  Labs:   Estimated body mass index is 28.06 kg/m as calculated from the following:   Height as of this encounter: 5\' 9"  (1.753 m).   Weight as of this encounter: 86.2 kg.   Imaging Review Plain radiographs demonstrate severe avascular necrosis of the left hip with a subchondral fracture. The bone quality appears  to be good for age and reported activity level. 4/18 radiographs show left total hip replacement with superior and posterior dislocation.      Assessment/Plan: Closed left total hip replacement with recurrent dislocation. In the face of previous long thoracolumbar fusion he has a nearly horizontal Sacral angle and the pelvis is flexed 30 degrees. There is higher incidence of dislocation in long segment  Thoracolumbar fusions.   Avascular necrosis of the left hip with femoral head collapse, left hip(s)  The  patient history, physical examination, clinical judgement of the provider and imaging studies are consistent with end stage AVN of the left hip(s) and total hip arthroplasty is deemed medically necessary. The treatment options including medical management, injection therapy, arthroscopy and arthroplasty were discussed at length. The risks and benefits of total hip arthroplasty were presented and reviewed. The risks due to aseptic loosening, infection, stiffness, dislocation/subluxation,  thromboembolic complications and other imponderables were discussed.  The patient acknowledged the explanation, agreed to proceed with the plan and consent was signed. Patient is being admitted for inpatient treatment for surgery, pain control, PT, OT, prophylactic antibiotics, VTE prophylaxis, progressive ambulation and ADL's and discharge planning.The patient is planning to be discharged home with home health services Due to dislocation acutely, closed reduction under a general anesthesia is  Recommended. Will plan for a hip abduction and ER brace preventing flexion greater than 90 degrees.   Anticipated LOS equal to or greater than 2 midnights due to - Age 19 and older with one or more of the following:  - Obesity  - Expected need for hospital services (PT, OT, Nursing) required for safe  discharge  - Anticipated need for postoperative skilled nursing care or inpatient rehab  - Active co-morbidities: CLL OR   - Unanticipated findings during/Post Surgery: None  - Patient is a high risk of re-admission due to: None

## 2018-08-28 NOTE — ED Provider Notes (Signed)
[facility] EMERGENCY DEPARTMENT Provider Note   CSN: [id number] Arrival date & time: 08/28/18  1335    History   Chief Complaint Chief Complaint  Patient presents with  . Hip Pain    HPI [name] is a 68 y.o. male.     68 yo M with a chief complaints of left hip dislocation.  Patient has had 3 surgeries in the past couple weeks due to recurrent dislocation.  Most recently was about 11 days ago.  He was seen at an outside ED and a follow-up in the orthopedist here and suggested that they transfer him to the ED to be evaluated.  Patient states that he was sitting into a shower chair when it dislocated.  He denies any other injury.  The history is provided by the patient.  Hip Pain  This is a new problem. The current episode started 3 to 5 hours ago. The problem occurs constantly. The problem has not changed since onset.Pertinent negatives include no chest pain, no abdominal pain, no headaches and no shortness of breath. The symptoms are aggravated by bending and twisting. Nothing relieves the symptoms. He has tried nothing for the symptoms. The treatment provided no relief.    Past Medical History:  Diagnosis Date  . Allergy   . Anxiety   . Carotid artery occlusion    40-60% right ICA stenosis (09/2008)  . Cataract   . Chronic back pain   . CLL (chronic lymphoblastic leukemia) dx 2010   Followed at [facility] q23mo, no current therapy   . Clotting disorder (HCC)   . Coronary artery disease 2010   s/p CABG '10, sees Dr. Antoine Poche  . Depression   . Diverticulosis   . DVT, lower extremity, recurrent (HCC) 2008, 2009   LLE, chronic anticoag since 2009  . Esophagitis   . Fibromyalgia   . Gallstones   . GERD (gastroesophageal reflux disease)   . Gout   . Gynecomastia, male   . H/O hiatal hernia 2008   surgery  . Hemorrhoids   . Hepatitis A yrs ago  . HIV infection (HCC) dx 1993  . Hypertension   . Impotence of organic origin   . Myocardial infarction  (HCC) 2010    x 2  . Neuromuscular disorder (HCC)    neuropathy  . Osteoarthritis, knee    s/p B TKA  . Osteoporosis   . Pneumonia mrach, may, july 2017  . Rheumatoid arthritis(714.0) dx 2010   MTX, follows with rheum  . Seasonal allergies   . Secondary syphilis 07/24/14 dx   s/p 2 wks doxy  . Status post dilation of esophageal narrowing   . Stroke (HCC) 1997   tia   . TIA (transient ischemic attack) 1997   mild residual L mouth droop  . Tubular adenoma of colon     Patient Active Problem List   Diagnosis Date Noted  . Recurrent dislocation of hip joint prosthesis (HCC) 08/28/2018    Class: Acute  . Pneumonia of both lungs due to infectious organism 08/24/2018  . Status post total replacement of left hip 08/13/2018  . Avascular necrosis of bone of hip, left (HCC) 08/09/2018  . Eczema 07/01/2018  . Candida rash of groin 07/01/2018  . Tinea corporis 01/22/2018  . Depression with anxiety 01/14/2018  . 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 Carotid artery disease (HCC) 11/13/2016  . Stenosis of carotid artery 11/13/2016  . Spinal stenosis of thoracolumbar region 07/02/2016  . ILD (interstitial  lung disease) (HCC) 09/13/2015  . Immunocompromised state (HCC) 09/13/2015  . PCP (pneumocystis jiroveci pneumonia) (HCC) 06/18/2015  . Postinflammatory pulmonary fibrosis (HCC) 05/11/2015  . GERD (gastroesophageal reflux disease)   . Hereditary and idiopathic peripheral neuropathy 09/08/2013  . Erosive esophagitis 12/28/2012  . Allergic rhinitis, cause unspecified 04/28/2012  . Long term current use of anticoagulant therapy 02/03/2012  . Hypertension   . DVT, lower extremity, recurrent (HCC)   . Gout   . Carotid artery occlusion   . Hyperlipidemia with target LDL less than 100   . HIV infection (HCC) 04/08/2011  . Arthritis, rheumatoid (HCC) 04/08/2011  . Chronic lymphoblastic leukemia 04/08/2011  . Impotence of organic origin 04/02/2011  . CAD (coronary artery disease)  of artery bypass graft 02/14/2009  . Herpes genitalis 06/22/2008    Past Surgical History:  Procedure Laterality Date  . ANTERIOR HIP REVISION Left 08/17/2018   Procedure: ANTERIOR LEFT HIP REVISION;  Surgeon: Kathryne Hitch, MD;  Location: [facility] OR;  Service: Orthopedics;  Laterality: Left;  . CHOLECYSTECTOMY    . COLONOSCOPY WITH PROPOFOL N/A 12/28/2012   Procedure: COLONOSCOPY WITH PROPOFOL;  Surgeon: Beverley Fiedler, MD;  Location: [facility];  Service: Gastroenterology;  Laterality: N/A;  . CORONARY ARTERY BYPASS GRAFT  2010   triple bypass  . ESOPHAGOGASTRODUODENOSCOPY (EGD) WITH PROPOFOL N/A 12/28/2012   Procedure: ESOPHAGOGASTRODUODENOSCOPY (EGD) WITH PROPOFOL;  Surgeon: Beverley Fiedler, MD;  Location: [facility];  Service: Gastroenterology;  Laterality: N/A;  . ESOPHAGOGASTRODUODENOSCOPY (EGD) WITH PROPOFOL N/A 03/15/2013   Procedure: ESOPHAGOGASTRODUODENOSCOPY (EGD) WITH PROPOFOL;  Surgeon: Beverley Fiedler, MD;  Location: [facility];  Service: Gastroenterology;  Laterality: N/A;  . ESOPHAGOGASTRODUODENOSCOPY (EGD) WITH PROPOFOL N/A 02/07/2016   Procedure: ESOPHAGOGASTRODUODENOSCOPY (EGD) WITH PROPOFOL;  Surgeon: Beverley Fiedler, MD;  Location: [facility];  Service: Gastroenterology;  Laterality: N/A;  . HARDWARE REMOVAL N/A 07/02/2012   Procedure: HARDWARE REMOVAL;  Surgeon: Mariam Dollar, MD;  Location: [facility];  Service: Neurosurgery;  Laterality: N/A;  . HIATAL HERNIA REPAIR     wrap  . HIP CLOSED REDUCTION Left 08/15/2018   Procedure: CLOSED REDUCTION HIP;  Surgeon: Nadara Mustard, MD;  Location: [facility] OR;  Service: Orthopedics;  Laterality: Left;  . INGUINAL HERNIA REPAIR Bilateral   . JOINT REPLACEMENT Left 1999  . KNEE ARTHROPLASTY  07/22/2011   Procedure: COMPUTER ASSISTED TOTAL KNEE ARTHROPLASTY;  Surgeon: Cammy Copa, MD;  Location: [facility] OR;  Service: Orthopedics;  Laterality: Right;  Right total knee arthroplasty  . MANDIBLE SURGERY Bilateral    tmj  . REPLACEMENT  TOTAL KNEE Bilateral   . ring around testicle hernia reapir  184 and 1986   x 2  . ROTATOR CUFF REPAIR Right   . SHOULDER SURGERY Left   . SPINE SURGERY  2010   "rod and screws", "failed", lopwer spine,   . stent to heart x 1  2010  . TONSILLECTOMY    . TOTAL HIP ARTHROPLASTY Left 08/13/2018   Procedure: LEFT TOTAL HIP ARTHROPLASTY ANTERIOR APPROACH;  Surgeon: Kathryne Hitch, MD;  Location: [facility] OR;  Service: Orthopedics;  Laterality: Left;  . UMBILICAL HERNIA REPAIR     x 1  . varicose vein     stripping  . VIDEO BRONCHOSCOPY Bilateral 10/16/2015   Procedure: VIDEO BRONCHOSCOPY WITHOUT FLUORO;  Surgeon: Kalman Shan, MD;  Location: [facility];  Service: Cardiopulmonary;  Laterality: Bilateral;        Home Medications    Prior to Admission medications  Medication Sig Start Date End Date Taking? Authorizing Provider  atorvastatin (LIPITOR) 10 MG tablet Take 1 tablet (10 mg total) by mouth at bedtime. 04/05/18   Rollene Rotunda, MD  clindamycin (CLEOCIN) 150 MG capsule Take 600 mg by mouth See admin instructions. Take 4 capsules (600 mg) by mouth 1 hour prior to dental appointments. 09/25/16   [provider]  darunavir (PREZISTA) 800 MG tablet TAKE 1 TABLET (800 MG TOTAL) BY MOUTH DAILY. 08/06/18   Ginnie Smart, MD  dextromethorphan (DELSYM) 30 MG/5ML liquid Take 5 mLs (30 mg total) by mouth 2 (two) times daily. 08/24/18   Etta Grandchild, MD  doxycycline (VIBRA-TABS) 100 MG tablet Take 1 tablet (100 mg total) by mouth 2 (two) times daily. 08/23/18   Kathryne Hitch, MD  elvitegravir-cobicistat-emtricitabine-tenofovir (GENVOYA) 150-150-200-10 MG TABS tablet Take 1 tablet by mouth daily with breakfast. 08/06/18   Ginnie Smart, MD  escitalopram (LEXAPRO) 20 MG tablet Take 20 mg by mouth daily. 07/07/18   [provider]  famotidine (PEPCID) 40 MG tablet TAKE 1 TABLET BY MOUTH TWICE DAILY FOR 2 MONTHS Patient taking differently: Take 40 mg by  mouth daily.  08/06/18   Pyrtle, Carie Caddy, MD  folic acid (FOLVITE) 1 MG tablet Take 1 mg by mouth daily. 09/17/17   [provider]  furosemide (LASIX) 20 MG tablet Take 1 tablet (20 mg total) by mouth 2 (two) times daily. 02/10/18   Etta Grandchild, MD  HYDROmorphone (DILAUDID) 2 MG tablet Take 1 tablet (2 mg total) by mouth every 6 (six) hours as needed for severe pain. 08/19/18   Kathryne Hitch, MD  leflunomide (ARAVA) 20 MG tablet Take 20 mg by mouth daily.  07/10/18   [provider]  nitroGLYCERIN (NITROSTAT) 0.4 MG SL tablet Place 1 tablet (0.4 mg total) under the tongue every 5 (five) minutes as needed for chest pain. 12/24/17 07/26/19  Rollene Rotunda, MD  predniSONE (DELTASONE) 5 MG tablet Take 5 mg by mouth 2 (two) times daily.  11/02/16   [provider]  pregabalin (LYRICA) 200 MG capsule TAKE 1 CAPSULE BY MOUTH TWICE A DAY Patient taking differently: Take 200 mg by mouth 2 (two) times daily.  07/27/18   Etta Grandchild, MD  REXULTI 0.25 MG TABS Take 0.25 mg by mouth daily.  03/02/18   [provider]  SAVAYSA 30 MG TABS tablet TAKE 1 TABLET (30 MG TOTAL) BY MOUTH DAILY. 08/06/18   Etta Grandchild, MD  sulfamethoxazole-trimethoprim (BACTRIM DS,SEPTRA DS) 800-160 MG tablet TAKE 1 TABLET BY MOUTH EVERY 12 (TWELVE) HOURS. 08/06/18   Etta Grandchild, MD  valACYclovir (VALTREX) 500 MG tablet Take 1 tablet (500 mg total) by mouth daily. 01/27/18   Ginnie Smart, MD    Family History Family History  Problem Relation Age of Onset  . Breast cancer Mother   . Hypertension Mother   . Hyperlipidemia Mother   . Diabetes Mother   . Prostate cancer Father   . Colon polyps Father   . Hyperlipidemia Father   . Crohn's disease Paternal Aunt   . Diabetes Maternal Grandmother   . Diabetes Brother        x 3  . Heart attack Brother   . Heart disease Brother        x 3  . Heart attack Brother   . Hyperlipidemia Brother        x 3  . Alcohol abuse Daughter     .  Drug abuse Daughter   . Asthma Brother   . CVA Brother   . Colon cancer Neg Hx   . Esophageal cancer Neg Hx   . Rectal cancer Neg Hx   . Stomach cancer Neg Hx     Social History Social History   Tobacco Use  . Smoking status: Never Smoker  . Smokeless tobacco: Never Used  . Tobacco comment: occ wine  Substance Use Topics  . Alcohol use: Yes    Comment: occasional wine- 1-2 per week  . Drug use: No     Allergies   Golimumab; Orencia [abatacept]; Other; Peanut-containing drug products; Morphine; Oxycodone-acetaminophen; Penicillins; and Promethazine hcl   Review of Systems Review of Systems  Constitutional: Negative for chills and fever.  HENT: Negative for congestion and facial swelling.   Eyes: Negative for discharge and visual disturbance.  Respiratory: Negative for shortness of breath.   Cardiovascular: Negative for chest pain and palpitations.  Gastrointestinal: Negative for abdominal pain, diarrhea and vomiting.  Musculoskeletal: Positive for arthralgias. Negative for myalgias.  Skin: Negative for color change and rash.  Neurological: Negative for tremors, syncope and headaches.  Psychiatric/Behavioral: Negative for confusion and dysphoric mood.     Physical Exam Updated Vital Signs BP 111/68   Pulse (!) 105   Resp 14   Ht 5\' 9"  (1.753 m)   Wt 86.2 kg   SpO2 99%   BMI 28.06 kg/m   Physical Exam Vitals signs and nursing note reviewed.  Constitutional:      Appearance: He is well-developed.  HENT:     Head: Normocephalic and atraumatic.  Eyes:     Pupils: Pupils are equal, round, and reactive to light.  Neck:     Musculoskeletal: Normal range of motion and neck supple.     Vascular: No JVD.  Cardiovascular:     Rate and Rhythm: Normal rate and regular rhythm.     Heart sounds: No murmur. No friction rub. No gallop.   Pulmonary:     Effort: No respiratory distress.     Breath sounds: No wheezing.  Abdominal:     General: There is no  distension.     Tenderness: There is no guarding or rebound.  Musculoskeletal: Normal range of motion.     Comments: Shortened and externally rotated L hip.  PMS intact distally  3+ pedal edema bilaterally.   Skin:    Coloration: Skin is not pale.     Findings: No rash.  Neurological:     Mental Status: He is alert and oriented to person, place, and time.  Psychiatric:        Behavior: Behavior normal.          ED Treatments / Results  Labs (all labs ordered are listed, but only abnormal results are displayed) Labs Reviewed  CBC WITH DIFFERENTIAL/PLATELET - Abnormal; Notable for the following components:      Result Value   RBC 2.74 (*)    Hemoglobin 9.2 (*)    HCT 28.1 (*)    MCV 102.6 (*)    Lymphs Abs 4.4 (*)    All other components within normal limits  COMPREHENSIVE METABOLIC PANEL - Abnormal; Notable for the following components:   Sodium 131 (*)    Chloride 93 (*)    Glucose, Bld 105 (*)    Calcium 7.8 (*)    Total Protein 4.7 (*)    Albumin 2.0 (*)    All other components within normal limits    EKG  EKG Interpretation  Date/Time:  Saturday August 28 2018 14:25:27 EDT Ventricular Rate:  102 PR Interval:    QRS Duration: 90 QT Interval:  403 QTC Calculation: 525 R Axis:   15 Text Interpretation:  Sinus tachycardia Abnormal T, consider ischemia, lateral leads Prolonged QT interval Baseline wander in lead(s) V1 downsloping st segments in I and avL seen onr prior Otherwise no significant change Confirmed by Melene Plan [phone]) on 08/28/2018 2:30:16 PM   Radiology Dg Chest Port 1 View  Result Date: 08/28/2018 CLINICAL DATA:  Preoperative study.  Multiple hip dislocations. EXAM: PORTABLE CHEST 1 VIEW COMPARISON:  August 17, 2018 FINDINGS: No pneumothorax. The cardiomediastinal silhouette is stable with mild cardiomegaly. Mild opacity at the left lung base is stable, likely scar atelectasis. The right lung is clear. No other changes. IMPRESSION: Stable opacity in  left base favored represent scar atelectasis given stability. No acute abnormality or change. Electronically Signed   By: Gerome Sam III M.D   On: 08/28/2018 14:57    Procedures Procedures (including critical care time)  Medications Ordered in ED Medications  fentaNYL (SUBLIMAZE) injection 50 mcg (50 mcg Intravenous Given 08/28/18 1500)     Initial Impression / Assessment and Plan / ED Course  I have reviewed the triage vital signs and the nursing notes.  Pertinent labs & imaging results that were available during my care of the patient were reviewed by me and considered in my medical decision making (see chart for details).        68 yo M with recurrent L hip dislocation.  Three revisions in the last couple of weeks.  Sent from OSH for ortho eval. I have put pictures of the xrays from OSH in the PE above. Superior lateral dislocation as viewed by me. Discussed case with Dr. Otelia Sergeant, will come and eval patient, recommended preop labwork, cxr.   Anemia appears to be near baseline, no noted leukocytosis.  Renal function at baseline.  CXR viewed by me without focal infiltrate.   Dr. Otelia Sergeant to take to the OR.   The patients results and plan were reviewed and discussed.   Any x-rays performed were independently reviewed by myself.   Differential diagnosis were considered with the presenting HPI.  Medications  fentaNYL (SUBLIMAZE) injection 50 mcg (50 mcg Intravenous Given 08/28/18 1500)    Vitals:   08/28/18 1507 08/28/18 1508 08/28/18 1512 08/28/18 1512  BP: 114/77 122/70 111/68   Pulse: (!) 103 100 (!) 105   Resp: 18 18 14    SpO2: 99% 100% 99%   Weight:    86.2 kg  Height:    5\' 9"  (1.753 m)    Final diagnoses:  Dislocation of left hip, initial encounter [facility])      Final Clinical Impressions(s) / ED Diagnoses   Final diagnoses:  Dislocation of left hip, initial encounter [facility])    ED Discharge Orders    None       Melene Plan, DO 08/28/18 1626

## 2018-08-29 LAB — GLUCOSE, CAPILLARY
Glucose-Capillary: 105 mg/dL — ABNORMAL HIGH (ref 70–99)
Glucose-Capillary: 115 mg/dL — ABNORMAL HIGH (ref 70–99)

## 2018-08-29 MED ORDER — HYDROCODONE-ACETAMINOPHEN 10-325 MG PO TABS
1.0000 | ORAL_TABLET | Freq: Four times a day (QID) | ORAL | Status: DC | PRN
  Administered 2018-08-29: 1 via ORAL
  Administered 2018-08-29: 2 via ORAL
  Administered 2018-08-30: 1 via ORAL
  Filled 2018-08-29 (×2): qty 1
  Filled 2018-08-29: qty 2

## 2018-08-29 NOTE — Anesthesia Postprocedure Evaluation (Signed)
Anesthesia Post Note  Patient: ANKITH EDMONSTON  Procedure(s) Performed: CLOSED REDUCTION HIP FOR RECURRENT DISLOCATION AND DRESSING CHANGE (Left Hip)     Patient location during evaluation: PACU Anesthesia Type: General Level of consciousness: awake and alert Pain management: pain level controlled Vital Signs Assessment: post-procedure vital signs reviewed and stable Respiratory status: spontaneous breathing, nonlabored ventilation, respiratory function stable and patient connected to nasal cannula oxygen Cardiovascular status: blood pressure returned to baseline and stable Postop Assessment: no apparent nausea or vomiting Anesthetic complications: no    Last Vitals:  Vitals:   08/29/18 1028 08/29/18 1416  BP: 104/66 115/74  Pulse: 88 87  Resp: 16 16  Temp: 36.6 C 36.6 C  SpO2: 99% 100%    Last Pain:  Vitals:   08/29/18 1416  TempSrc: Oral  PainSc:                  Alexza Norbeck

## 2018-08-29 NOTE — Progress Notes (Signed)
     Subjective: 1 Day Post-Op Procedure(s) (LRB): CLOSED REDUCTION HIP FOR RECURRENT DISLOCATION AND DRESSING CHANGE (Left) Left hip with dressing and small abount of drainage.  Dressing intact Moderate left anterior hip  Incision swelling with likely hematoma.   Patient reports pain as moderate.   Reports tramadol does not touch pain, prefers dilaudid. Hydrocodone is not strong enough. Will increase dose of hydrocodone but dilaudid is not an option nor is oxycodone as the hip is reduced and  These medications are extremely likely to cause an increase in narcotic tolerance.   No help at home and with current brace he feels that he is even more limited is his ability to move and walk and take care of himself. History of rheumatoid arthritis with  Significant deformity of hands and multiple joint involvement.  No family help at home and daughter lives at a distance. Had previous inpatient status at [facility] and would consider that facility.   Objective:   VITALS:  Temp:  [97.5 F (36.4 C)-98.8 F (37.1 C)] 97.9 F (36.6 C) (04/19 1028) Pulse Rate:  [79-107] 88 (04/19 1028) Resp:  [12-23] 16 (04/19 1028) BP: (97-127)/(66-85) 104/66 (04/19 1028) SpO2:  [92 %-100 %] 99 % (04/19 1028) Weight:  [86.2 kg] 86.2 kg (04/18 1512)  Neurologically intact ABD soft Neurovascular intact Sensation intact distally Intact pulses distally Dorsiflexion/Plantar flexion intact Incision: scant drainage   LABS Recent Labs    08/28/18 1412  HGB 9.2*  WBC 8.7  PLT 150   Recent Labs    08/28/18 1412  NA 131*  K 3.5  CL 93*  CO2 25  BUN 18  CREATININE 0.95  GLUCOSE 105*   No results for input(s): LABPT, INR in the last 72 hours.   Assessment/Plan: 1 Day Post-Op Procedure(s) (LRB): CLOSED REDUCTION HIP FOR RECURRENT DISLOCATION AND DRESSING CHANGE (Left)  Advance diet Up with therapy D/C IV fluids Discharge to SNF  Basil Dess 08/29/2018, 11:28 AMPatient ID: [name], male   DOB: 1950/11/13, 68 y.o.   MRN: [id number]

## 2018-08-29 NOTE — Plan of Care (Signed)

## 2018-08-30 ENCOUNTER — Telehealth: Payer: Self-pay | Admitting: Internal Medicine

## 2018-08-30 ENCOUNTER — Encounter (HOSPITAL_COMMUNITY): Payer: Self-pay | Admitting: Specialist

## 2018-08-30 ENCOUNTER — Inpatient Hospital Stay (HOSPITAL_COMMUNITY): Payer: Medicare Other

## 2018-08-30 ENCOUNTER — Inpatient Hospital Stay (INDEPENDENT_AMBULATORY_CARE_PROVIDER_SITE_OTHER): Payer: Medicare Other | Admitting: Orthopaedic Surgery

## 2018-08-30 MED ORDER — GERHARDT'S BUTT CREAM
TOPICAL_CREAM | Freq: Two times a day (BID) | CUTANEOUS | Status: DC
  Administered 2018-08-30 – 2018-09-01 (×3): via TOPICAL
  Filled 2018-08-30: qty 1

## 2018-08-30 MED ORDER — HYDROMORPHONE HCL 2 MG PO TABS
2.0000 mg | ORAL_TABLET | ORAL | Status: DC | PRN
  Administered 2018-08-30 (×2): 2 mg via ORAL
  Filled 2018-08-30 (×2): qty 1

## 2018-08-30 NOTE — NC FL2 (Signed)
[place] LEVEL OF CARE SCREENING TOOL     IDENTIFICATION  Patient Name: [name] Birthdate: December 20, 1950 Sex: male Admission Date (Current Location): 08/28/2018  [facility] and [place] Number:  Herbalist and Address:  [facility]. [facility], [place] [address] [place], [place] [address]      Provider Number: [id number]  Attending Physician Name and Address:  Mcarthur Rossetti,*  Relative Name and Phone Number:  [name] (daughter) [phone]    Current Level of Care: Hospital Recommended Level of Care: [place] Prior Approval Number:    Date Approved/Denied: 03/27/09 PASRR Number: [id number]  Discharge Plan: SNF    Current Diagnoses: Patient Active Problem List   Diagnosis Date Noted  . Recurrent dislocation of hip joint prosthesis ([place]) 08/28/2018    Class: Acute  . Pneumonia of both lungs due to infectious organism 08/24/2018  . Status post total replacement of left hip 08/13/2018  . Avascular necrosis of bone of hip, left ([place]) 08/09/2018  . Eczema 07/01/2018  . Candida rash of groin 07/01/2018  . Tinea corporis 01/22/2018  . Depression with anxiety 01/14/2018  . 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 Carotid artery disease ([place]) 11/13/2016  . Stenosis of carotid artery 11/13/2016  . Spinal stenosis of thoracolumbar region 07/02/2016  . ILD (interstitial lung disease) ([place]) 09/13/2015  . Immunocompromised state ([place]) 09/13/2015  . PCP (pneumocystis jiroveci pneumonia) ([place]) 06/18/2015  . Postinflammatory pulmonary fibrosis ([place]) 05/11/2015  . GERD (gastroesophageal reflux disease)   . Hereditary and idiopathic peripheral neuropathy 09/08/2013  . Erosive esophagitis 12/28/2012  . Allergic rhinitis, cause unspecified 04/28/2012  . Long term current use of anticoagulant therapy 02/03/2012  . Hypertension   . DVT, lower extremity, recurrent ([place])   . Gout   . Carotid artery occlusion   .  Hyperlipidemia with target LDL less than 100   . HIV infection ([place]) 04/08/2011  . Arthritis, rheumatoid ([place]) 04/08/2011  . Chronic lymphoblastic leukemia 04/08/2011  . Impotence of organic origin 04/02/2011  . CAD (coronary artery disease) of artery bypass graft 02/14/2009  . Herpes genitalis 06/22/2008    Orientation RESPIRATION BLADDER Height & Weight     Self, Time, Situation, Place  Normal Continent Weight: 190 lb (86.2 kg) Height:  5\' 9"  (175.3 cm)  BEHAVIORAL SYMPTOMS/MOOD NEUROLOGICAL BOWEL NUTRITION STATUS      Continent Diet(see discharge summary)  AMBULATORY STATUS COMMUNICATION OF NEEDS Skin   Extensive Assist Verbally PU Stage and Appropriate Care, Surgical wounds, Other (Comment)(ecchymosis left hip, MASD groin and buttocks, pressure injury stage 1 buttocks, left hip closed surgical incision)                       Personal Care Assistance Level of Assistance  Bathing, Feeding, Dressing, Total care Bathing Assistance: Limited assistance Feeding assistance: Limited assistance Dressing Assistance: Limited assistance Total Care Assistance: Maximum assistance   Functional Limitations Info  Sight, Hearing, Speech Sight Info: Adequate Hearing Info: Adequate Speech Info: Adequate    SPECIAL CARE FACTORS FREQUENCY  PT (By licensed PT), OT (By licensed OT)     PT Frequency: min 5x weekly OT Frequency: min 5x weekly            Contractures Contractures Info: Not present    Additional Factors Info  Code Status, Allergies Code Status Info: full Allergies Info: Golimumab, Orencia (abatacept), Pecans, Peanut-containing drug products, Morphine, Oxycodone-acetaminophen, Penicillins, Promethazine Hcl  Current Medications (08/30/2018):  This is the current hospital active medication list Current Facility-Administered Medications  Medication Dose Route Frequency Provider Last Rate Last Dose  . 0.9 %  sodium chloride infusion   Intravenous Continuous  Mcarthur Rossetti, MD   Stopped at 08/29/18 1551  . atorvastatin (LIPITOR) tablet 10 mg  10 mg Oral QHS Jessy Oto, MD   10 mg at 08/29/18 2110  . bisacodyl (DULCOLAX) EC tablet 5 mg  5 mg Oral Daily PRN Jessy Oto, MD      . brexpiprazole (REXULTI) 0.25 mg per half tablet 0.25 mg  0.25 mg Oral Daily Einar Grad, RPH   0.25 mg at 08/29/18 0834  . darunavir (PREZISTA) tablet 800 mg  800 mg Oral Q breakfast Jessy Oto, MD   800 mg at 08/29/18 0835  . diphenhydrAMINE (BENADRYL) 12.5 MG/5ML elixir 12.5-25 mg  12.5-25 mg Oral Q4H PRN Jessy Oto, MD      . docusate sodium (COLACE) capsule 100 mg  100 mg Oral BID Jessy Oto, MD   100 mg at 08/29/18 2110  . doxycycline (VIBRA-TABS) tablet 100 mg  100 mg Oral BID Jessy Oto, MD   100 mg at 08/29/18 2110  . edoxaban (SAVAYSA) tablet 30 mg  30 mg Oral Q24H Jessy Oto, MD   30 mg at 08/29/18 2114  . elvitegravir-cobicistat-emtricitabine-tenofovir (GENVOYA) 150-150-200-10 MG tablet 1 tablet  1 tablet Oral Q breakfast Jessy Oto, MD   1 tablet at 08/29/18 [phone]  . escitalopram (LEXAPRO) tablet 20 mg  20 mg Oral Daily Jessy Oto, MD   20 mg at 08/29/18 0835  . famotidine (PEPCID) tablet 40 mg  40 mg Oral Daily Jessy Oto, MD   40 mg at 08/29/18 0835  . ferrous sulfate tablet 325 mg  325 mg Oral TID PC Jessy Oto, MD   325 mg at 08/29/18 1605  . folic acid (FOLVITE) tablet 1 mg  1 mg Oral Daily Jessy Oto, MD   1 mg at 08/29/18 6945  . furosemide (LASIX) tablet 20 mg  20 mg Oral BID Jessy Oto, MD   20 mg at 08/29/18 1605  . HYDROmorphone (DILAUDID) tablet 2 mg  2 mg Oral Q4H PRN Mcarthur Rossetti, MD      . leflunomide (ARAVA) tablet 20 mg  20 mg Oral Daily Jessy Oto, MD   20 mg at 08/29/18 0834  . menthol-cetylpyridinium (CEPACOL) lozenge 3 mg  1 lozenge Oral PRN Jessy Oto, MD       Or  . phenol (CHLORASEPTIC) mouth spray 1 spray  1 spray Mouth/Throat PRN Jessy Oto, MD      .  metoCLOPramide (REGLAN) tablet 5-10 mg  5-10 mg Oral Q8H PRN Jessy Oto, MD       Or  . metoCLOPramide (REGLAN) injection 5-10 mg  5-10 mg Intravenous Q8H PRN Jessy Oto, MD      . morphine 2 MG/ML injection 0.5-1 mg  0.5-1 mg Intravenous Q2H PRN Jessy Oto, MD      . nitroGLYCERIN (NITROSTAT) SL tablet 0.4 mg  0.4 mg Sublingual Q5 min PRN Jessy Oto, MD      . ondansetron [facility]) tablet 4 mg  4 mg Oral Q6H PRN Jessy Oto, MD       Or  . ondansetron [facility]) injection 4 mg  4 mg Intravenous Q6H PRN Jessy Oto, MD      .  polyethylene glycol (MIRALAX / GLYCOLAX) packet 17 g  17 g Oral Daily PRN Jessy Oto, MD      . predniSONE (DELTASONE) tablet 5 mg  5 mg Oral BID Jessy Oto, MD   5 mg at 08/29/18 2110  . pregabalin (LYRICA) capsule 200 mg  200 mg Oral BID Jessy Oto, MD   200 mg at 08/29/18 2110  . sodium phosphate (FLEET) 7-19 GM/118ML enema 1 enema  1 enema Rectal Once PRN Jessy Oto, MD      . sulfamethoxazole-trimethoprim (BACTRIM DS) 800-160 MG per tablet 1 tablet  1 tablet Oral Q12H Jessy Oto, MD   1 tablet at 08/29/18 2110  . traMADol (ULTRAM) tablet 50 mg  50 mg Oral Q6H Jessy Oto, MD   50 mg at 08/30/18 5498  . valACYclovir (VALTREX) tablet 500 mg  500 mg Oral Daily Jessy Oto, MD   500 mg at 08/29/18 2641     Discharge Medications: Please see discharge summary for a list of discharge medications.  Relevant Imaging Results:  Relevant Lab Results:   Additional Information SSN: [id number]  Alberteen Sam, LCSW

## 2018-08-30 NOTE — Progress Notes (Signed)
Patient ID: [name], male   DOB: Feb 15, 1951, 68 y.o.   MRN: [id number] His chest x-ray shows just bilateral atelectasis.  His WBC was also normal just 2 days ago.  He need to cough and deep breath as well as use his incentive spirometer.  No evidence of pneumonia.

## 2018-08-30 NOTE — Progress Notes (Signed)
Called to room by secretary for pt c/o SOB. Found pt talking on the telephone with no signs of distress. Pt ended his call and immediately began labored breathing and panting stating "I can't catch my breath". Did auscultate expiratory wheezing bilaterally. Has dry cough but feels he has congestion that he can't "cough up". Sats 88-89% on room. Placed oxygen at 2L and sats improved to 63%. Notified MD. CXR ordered. Encouraging fluids and incentive spirometer.

## 2018-08-30 NOTE — Plan of Care (Signed)
  Problem: Coping: Goal: Level of anxiety will decrease Outcome: Progressing   Problem: Elimination: Goal: Will not experience complications related to urinary retention Outcome: Progressing   Problem: Safety: Goal: Ability to remain free from injury will improve Outcome: Progressing   

## 2018-08-30 NOTE — Consult Note (Signed)
[facility] Nurse wound consult note Reason for Consult:Stage 2 pressure injury to sacrum. Present on admission.  Wound type:stage 2 pressure Pressure Injury POA: Yes Measurement: 2 cm x 1 cm x 0.1 cm  Wound RVU:YEBX Drainage (amount, consistency, odor) scant weeping Periwound:intact Dressing procedure/placement/frequency:Cleanse sacral wound with NS and pat dry.  Apply Gerhardts butt paste twice daily and PRn soilage.  Will not follow at this time.  Please re-consult if needed.  Domenic Moras MSN, RN, FNP-BC CWON Wound, Ostomy, Continence Nurse Pager [phone]

## 2018-08-30 NOTE — Evaluation (Signed)
Physical Therapy Evaluation Patient Details Name: [name] MRN: [id number] DOB: 30-Sep-1950 Today's Date: 08/30/2018   History of Present Illness  68 y.o. male with a history of pain and functional disability in the left hip due to avascular necrosis with femoral head collapse. He was admitted on 08/28/2018 follwing a second dislocation. He shifted while sitting on the tub bench and had a dislocation. He currently is wering a hip stabilization brace. S/p Left total hip arthroplasty through direct anterior approach 08/13/2018 08/15/18 Pt self dislocated L hip attempting to put on shoes to go home. Returned to OR for relocation of hip. s/p L hip arthroplasty repair 08/17/18.  PMH: CAD, RA, recurrent DVT ; multiple back surgeries  Clinical Impression  Patient was able to ambulate 12'. He required an elevated surface to transfer from sit to stand. Therapy put a pillow in  His chair to keep hm from sitting too low. He restated his precautions. He is very motivated to improve his ability to ambulate. Acute PT will continue to follow the patient for strengthening and functional mobility training. He may also benefit from rehab at a SNF depending on surgical recommendations.        Follow Up Recommendations Follow surgeon's recommendation for DC plan and follow-up therapies    Equipment Recommendations  None recommended by PT    Recommendations for Other Services Rehab consult     Precautions / Restrictions Precautions Precautions: Posterior Hip Precaution Booklet Issued: Yes (comment) Precaution Comments: re-stated his percautions several times.  Required Braces or Orthoses: Other Brace(Hip stabilization brace) Restrictions Weight Bearing Restrictions: Yes LLE Weight Bearing: Weight bearing as tolerated      Mobility  Bed Mobility Overal bed mobility: Needs Assistance Bed Mobility: Supine to Sit     Supine to sit: Mod assist     General bed mobility comments: mod a to get lef tLE out of  the bed and to sit. Once sitting supervison for safety   Transfers Overall transfer level: Needs assistance Equipment used: Rolling walker (2 wheeled) Transfers: Sit to/from Stand Sit to Stand: Min assist;From elevated surface         General transfer comment: Patient had to have the bed up significantly. he required min a for strength to stand  Ambulation/Gait Ambulation/Gait assistance: Min guard Gait Distance (Feet): 12 Feet Assistive device: Rolling walker (2 wheeled) Gait Pattern/deviations: Step-to pattern Gait velocity: decreased   General Gait Details: Walked with short steps and flexed trunk. He was slow but had no loss of balane and not significant increase in pain   Stairs            Wheelchair Mobility    Modified Rankin (Stroke Patients Only)       Balance Overall balance assessment: Needs assistance Sitting-balance support: Feet supported;No upper extremity supported Sitting balance-[name]: Good     Standing balance support: Bilateral upper extremity supported Standing balance-[name]: Poor                               Pertinent Vitals/Pain Pain Assessment: Faces Faces Pain Scale: Hurts even more Pain Location: L hip  Pain Descriptors / Indicators: Aching;Sore;Throbbing Pain Intervention(s): Limited activity within patient's tolerance;Monitored during session;Premedicated before session;Repositioned    Home Living Family/patient expects to be discharged to:: Skilled nursing facility Living Arrangements: Alone Available Help at Discharge: Family;Available 24 hours/day Type of Home: House Home Access: Stairs to enter Entrance Stairs-Rails: Right Entrance  Stairs-Number of Steps: 4 Home Layout: One level Home Equipment: Grab bars - toilet;Walker - 2 wheels;Shower seat Additional Comments: Daughter is going to come stay with him while he recovers.    Prior Function Level of Independence: Independent with assistive  device(s)         Comments: used cane for ambulation      Hand Dominance   Dominant Hand: Right    Extremity/Trunk Assessment   Upper Extremity Assessment Upper Extremity Assessment: Defer to OT evaluation(has baseline shoulder problems )    Lower Extremity Assessment LLE Deficits / Details: L hip and knee ROM limited by surgical pain, ankle WFL LLE: Unable to fully assess due to pain;Unable to fully assess due to immobilization    Cervical / Trunk Assessment Cervical / Trunk Assessment: Kyphotic  Communication   Communication: No difficulties  Cognition Arousal/Alertness: Awake/alert Behavior During Therapy: WFL for tasks assessed/performed Overall Cognitive Status: Within Functional Limits for tasks assessed                                        General Comments      Exercises     Assessment/Plan    PT Assessment Patient needs continued PT services  PT Problem List Decreased strength;Decreased range of motion;Decreased activity tolerance;Decreased balance;Decreased mobility;Decreased coordination;Pain;Decreased knowledge of use of DME       PT Treatment Interventions DME instruction;Gait training;Stair training;Functional mobility training;Therapeutic activities;Therapeutic exercise;Balance training;Patient/family education;Cognitive remediation    PT Goals (Current goals can be found in the Care Plan section)  Acute Rehab PT Goals Patient Stated Goal: to get better PT Goal Formulation: With patient Time For Goal Achievement: 08/27/18 Potential to Achieve Goals: Fair    Frequency Min 5X/week   Barriers to discharge        Co-evaluation               AM-PAC PT "6 Clicks" Mobility  Outcome Measure Help needed turning from your back to your side while in a flat bed without using bedrails?: A Little Help needed moving from lying on your back to sitting on the side of a flat bed without using bedrails?: A Little Help needed  moving to and from a bed to a chair (including a wheelchair)?: A Lot Help needed standing up from a chair using your arms (e.g., wheelchair or bedside chair)?: A Lot Help needed to walk in hospital room?: A Lot Help needed climbing 3-5 steps with a railing? : A Lot 6 Click Score: 14    End of Session         PT Visit Diagnosis: Unsteadiness on feet (R26.81);Other abnormalities of gait and mobility (R26.89);Muscle weakness (generalized) (M62.81);Difficulty in walking, not elsewhere classified (R26.2);Pain Pain - Right/Left: Left Pain - part of body: Hip    Time: 1610-9604 PT Time Calculation (min) (ACUTE ONLY): 25 min   Charges:   PT Evaluation $PT Eval Moderate Complexity: 1 Mod           Carney Living PT DPT  08/30/2018, 12:44 PM

## 2018-08-30 NOTE — Progress Notes (Signed)
Patient ID: [name], male   DOB: 1950-05-23, 68 y.o.   MRN: [id number] I had a long and thorough discussion with the patient today.  He is in a hip abduction brace.  We need to get him up with both PT and OT for safety purposes.  He will need to be on strict posterior hip precautions.  I talked to him about revising the acetabular component of his left hip if he has another dislocation with the need to use more significantly Antivert the acetabular component.  He understands this fully.  I do not feel that is safe for him to be going home right now thus we need to get social work involved for short-term skilled nursing placement.  I did drain several cc of the hematoma off of his incision.  All question concerns were answered and addressed.  I will check on him later today as well.

## 2018-08-30 NOTE — Telephone Encounter (Signed)
Patient called stating that he missed a call. Was this from you? Can you call patient to follow up?

## 2018-08-30 NOTE — Plan of Care (Signed)
  Problem: Education: Goal: Knowledge of General Education information will improve Description: Including pain rating scale, medication(s)/side effects and non-pharmacologic comfort measures Outcome: Progressing   Problem: Clinical Measurements: Goal: Will remain free from infection Outcome: Progressing Goal: Diagnostic test results will improve Outcome: Progressing   Problem: Activity: Goal: Risk for activity intolerance will decrease Outcome: Progressing   

## 2018-08-30 NOTE — Progress Notes (Signed)
CSW acknowledges SNF consult, pending PT/OT recs.   Brendolyn Stockley, LCSW [phone]  

## 2018-08-30 NOTE — Telephone Encounter (Signed)
Copied from [place] [id number]. Topic: Quick Communication - See Telephone Encounter >> Aug 30, 2018  9:00 AM [name] wrote: CRM for notification. See Telephone encounter for: 08/30/18. Patient would like to let [name] know that he is back in the hospital. He is dropping into depression and wants [name] to look at his medications and see if something needs to be changed. He would like Christopher Adah Salvage nurse to call him.   [phone]

## 2018-08-30 NOTE — Telephone Encounter (Signed)
Pt contacted and stated that he is feeling extremely depressed and like there is a dark cloud over him all of the time. Pt stated that he feels that he needs something to help boost his mood.   Please advise if there a change that can be made or if a visit needs to be made. Pt is currently admitted for hip prosthesis dislocation surgery and may go to a rehab facility.

## 2018-08-31 ENCOUNTER — Inpatient Hospital Stay (INDEPENDENT_AMBULATORY_CARE_PROVIDER_SITE_OTHER): Payer: Medicare Other | Admitting: Orthopaedic Surgery

## 2018-08-31 MED ORDER — HYDROCODONE-ACETAMINOPHEN 7.5-325 MG PO TABS: 1.0000 | ORAL_TABLET | Freq: Four times a day (QID) | ORAL | Status: DC | PRN

## 2018-08-31 MED ORDER — POLYETHYLENE GLYCOL 3350 17 G PO PACK
17.0000 g | PACK | Freq: Every day | ORAL | Status: DC | PRN
  Administered 2018-09-01: 17 g via ORAL
  Filled 2018-08-31: qty 1

## 2018-08-31 MED ORDER — SENNOSIDES-DOCUSATE SODIUM 8.6-50 MG PO TABS
1.0000 | ORAL_TABLET | Freq: Once | ORAL | Status: AC
  Administered 2018-08-31: 1 via ORAL
  Filled 2018-08-31: qty 1

## 2018-08-31 NOTE — TOC Initial Note (Signed)
Transition of Care [facility]) - Initial/Assessment Note    Patient Details  Name: [name] MRN: [id number] Date of Birth: 12-01-1950  [facility] CM/SW Contact:    Alberteen Sam, LCSW Phone Number: 08/31/2018, 9:26 AM  Clinical Narrative:                  CSW consulted with patient regarding DC plan for [place] for short term rehab. 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 He reports he has been to [facility] in the past and had a positive experience. Would like to return to [place] at discharge and in agreement with PT recommendation of SNF. CSW sent referral to [facility], awaiting bed availability at this time.   Expected Discharge Plan: [place] Barriers to Discharge: No Barriers Identified   Patient Goals and CMS Choice Patient states their goals for this hospitalization and ongoing recovery are:: to go to [facility] then home CMS Medicare.gov Compare Post Acute Care list provided to:: Patient Choice offered to / list presented to : Patient  Expected Discharge Plan and Services Expected Discharge Plan: [place]   Discharge Planning Services: NA Post Acute Care Choice: [place] Living arrangements for the past 2 months: Single Family Home                 DME Arranged: N/A DME Agency: NA HH Arranged: NA HH Agency: NA  Prior Living Arrangements/Services Living arrangements for the past 2 months: [place] with:: Self Patient language and need for interpreter reviewed:: Yes Do you feel safe going back to the place where you live?: No   needs short term rehab before returning home  Need for Family Participation in Patient Care: No (Comment) Care giver support system in place?: Yes (comment)   Criminal Activity/Legal Involvement Pertinent to Current Situation/Hospitalization: No - Comment as  needed  Activities of Daily Living Home Assistive Devices/Equipment: Cane (specify quad or straight), Walker (specify type) ADL Screening (condition at time of admission) Patient's cognitive ability adequate to safely complete daily activities?: Yes Is the patient deaf or have difficulty hearing?: No Does the patient have difficulty seeing, even when wearing glasses/contacts?: No Does the patient have difficulty concentrating, remembering, or making decisions?: No Patient able to express need for assistance with ADLs?: Yes Does the patient have difficulty dressing or bathing?: Yes Independently performs ADLs?: No Communication: Independent Does the patient have difficulty walking or climbing stairs?: Yes Weakness of Legs: Both Weakness of Arms/Hands: None  Permission Sought/Granted Permission sought to share information with : Case Manager, Customer service manager, Family Supports Permission granted to share information with : Yes, Verbal Permission Granted  Share Information with NAME: [name]  Permission granted to share info w AGENCY: SNFs  Permission granted to share info w Relationship: daughter  Permission granted to share info w Contact Information: [phone]  Emotional Assessment Appearance:: Appears stated age Attitude/Demeanor/Rapport: Gracious Affect (typically observed): Accepting Orientation: : Oriented to Self, Oriented to Place, Oriented to  Time, Oriented to Situation Alcohol / Substance Use: Not Applicable Psych Involvement: No (comment)  Admission diagnosis:  Dislocation; left leg Patient Active Problem List   Diagnosis Date Noted  . Recurrent dislocation of hip joint prosthesis ([place]) 08/28/2018    Class: Acute  . Pneumonia of both lungs due to infectious organism 08/24/2018  . Status post total replacement of left hip 08/13/2018  .  Avascular necrosis of bone of hip, left ([place]) 08/09/2018  . Eczema 07/01/2018  . Candida rash of groin 07/01/2018   . Tinea corporis 01/22/2018  . Depression with anxiety 01/14/2018  . 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 Carotid artery disease ([place]) 11/13/2016  . Stenosis of carotid artery 11/13/2016  . Spinal stenosis of thoracolumbar region 07/02/2016  . ILD (interstitial lung disease) ([place]) 09/13/2015  . Immunocompromised state ([place]) 09/13/2015  . PCP (pneumocystis jiroveci pneumonia) ([place]) 06/18/2015  . Postinflammatory pulmonary fibrosis ([place]) 05/11/2015  . GERD (gastroesophageal reflux disease)   . Hereditary and idiopathic peripheral neuropathy 09/08/2013  . Erosive esophagitis 12/28/2012  . Allergic rhinitis, cause unspecified 04/28/2012  . Long term current use of anticoagulant therapy 02/03/2012  . Hypertension   . DVT, lower extremity, recurrent ([place])   . Gout   . Carotid artery occlusion   . Hyperlipidemia with target LDL less than 100   . HIV infection ([place]) 04/08/2011  . Arthritis, rheumatoid ([place]) 04/08/2011  . Chronic lymphoblastic leukemia 04/08/2011  . Impotence of organic origin 04/02/2011  . CAD (coronary artery disease) of artery bypass graft 02/14/2009  . Herpes genitalis 06/22/2008   PCP:  [name], MD Pharmacy:   [id number] [place], [place] [address] [place] [address] Phone: [phone] Fax: [phone]  [id number] [place], [place] - [address] AT [facility] [address] [place] [address] Phone: [phone] Fax: [phone]     Social Determinants of Health (SDOH) Interventions    Readmission Risk Interventions No flowsheet data found.

## 2018-08-31 NOTE — Progress Notes (Signed)
Physical Therapy Treatment Patient Details Name: [name] MRN: [id number] DOB: 13-Nov-1950 Today's Date: 08/31/2018    History of Present Illness 68 yo male readmitted 04/18 due to subluxation L hip while sitting on the tub bench and had a dislocation. He currently is wearing a hip stabilization brace. S/p Left total hip arthroplasty through direct anterior approach 4/3 and 08/15/18 Pt self dislocated L hip attempting to put on shoes to go home from [facility] then Returned to OR for relocation of hip. s/p L hip arthroplasty repair 08/17/18. pt discharged to home on 4/10 prior to this readmission.  PMH: CAD, RA, recurrent DVT ; multiple back surgeries     PT Comments    Pt performed LE strengthening after refusing OOB activity.  Post exercises he is agreeable and requesting OOB.  Performed short bout of gait training limited due to fatigue.  Plan for SNF remains appropriate and patient is eager to get to rehab and recover.  Plan next session for progression of mobility and exercises to tolerance while maintaining hip precautions.      Follow Up Recommendations  Follow surgeon's recommendation for DC plan and follow-up therapies     Equipment Recommendations  None recommended by PT    Recommendations for Other Services Rehab consult     Precautions / Restrictions Precautions Precautions: Posterior Hip Precaution Booklet Issued: Yes (comment) Precaution Comments: able to restate posterior hip precautions  Required Braces or Orthoses: Other Brace Knee Immobilizer - Left: On at all times Other Brace: per dr Ninfa Linden. the hip brace should only be doff supine to prevent subluxation. Brace can be doff in standing only if patient is completely upright. pt can not be flexed at the hips or he will sublux.  Restrictions Weight Bearing Restrictions: Yes LLE Weight Bearing: Weight bearing as tolerated    Mobility  Bed Mobility Overal bed mobility: Needs Assistance Bed Mobility: Supine to Sit;Sit  to Supine     Supine to sit: Min assist Sit to supine: Min assist   General bed mobility comments: Assistance to advance LE into and out of bed.    Transfers Overall transfer level: Needs assistance Equipment used: Rolling walker (2 wheeled) Transfers: Sit to/from Stand Sit to Stand: Min guard;From elevated surface         General transfer comment: pt able to power up with elevated surface. Required cues to advance LLE forward to avoid excessive hip flexion.    Ambulation/Gait Ambulation/Gait assistance: Min guard;Min assist Gait Distance (Feet): 18 Feet Assistive device: Rolling walker (2 wheeled) Gait Pattern/deviations: Step-to pattern;Trunk flexed;Narrow base of support;Shuffle     General Gait Details: Pt with chronic flexed posture, short shuffling steps and required increased assistance as gait progressed.  Cues for upper trunk control and increasing stride length.     Stairs             Wheelchair Mobility    Modified Rankin (Stroke Patients Only)       Balance Overall balance assessment: Needs assistance Sitting-balance support: Feet supported;No upper extremity supported Sitting balance-[name]: Good       Standing balance-[name]: Poor                              Cognition Arousal/Alertness: Awake/alert Behavior During Therapy: WFL for tasks assessed/performed Overall Cognitive Status: Within Functional Limits for tasks assessed  Exercises General Exercises - Lower Extremity Ankle Circles/Pumps: AROM;Both;15 reps;Supine Quad Sets: AROM;Both;10 reps;Supine Heel Slides: AROM;AAROM;Both;10 reps;Supine;Limitations Heel Slides Limitations: LLE conservative with in movement of brace to maintain hip prec.   Hip ABduction/ADduction: AROM;Both;10 reps;Supine;Limitations Hip Abduction/Adduction Limitations: increased pain with hip abduction in groin, limited movement to pain  tolerance.      General Comments        Pertinent Vitals/Pain Pain Assessment: Faces Faces Pain Scale: Hurts little more Pain Location: hip L Pain Descriptors / Indicators: Discomfort Pain Intervention(s): Monitored during session;Repositioned    Home Living                      Prior Function            PT Goals (current goals can now be found in the care plan section) Acute Rehab PT Goals Patient Stated Goal: going to have another surgery Potential to Achieve Goals: Fair Progress towards PT goals: Progressing toward goals    Frequency    Min 5X/week      PT Plan Current plan remains appropriate    Co-evaluation              AM-PAC PT "6 Clicks" Mobility   Outcome Measure  Help needed turning from your back to your side while in a flat bed without using bedrails?: A Little Help needed moving from lying on your back to sitting on the side of a flat bed without using bedrails?: A Little Help needed moving to and from a bed to a chair (including a wheelchair)?: A Lot Help needed standing up from a chair using your arms (e.g., wheelchair or bedside chair)?: A Lot Help needed to walk in hospital room?: A Lot Help needed climbing 3-5 steps with a railing? : A Lot 6 Click Score: 14    End of Session Equipment Utilized During Treatment: Gait belt Activity Tolerance: Patient limited by pain Patient left: in chair;with call bell/phone within reach;with chair alarm set Nurse Communication: Mobility status;Precautions;Weight bearing status PT Visit Diagnosis: Unsteadiness on feet (R26.81);Other abnormalities of gait and mobility (R26.89);Muscle weakness (generalized) (M62.81);Difficulty in walking, not elsewhere classified (R26.2);Pain Pain - Right/Left: Left Pain - part of body: Hip     Time: 7793-9030 PT Time Calculation (min) (ACUTE ONLY): 32 min  Charges:  $Gait Training: 8-22 mins $Therapeutic Exercise: 8-22 mins                     Governor Rooks, PTA Acute Rehabilitation Services Pager [phone] Office [phone]     [name] 08/31/2018, 5:16 PM

## 2018-08-31 NOTE — Progress Notes (Addendum)
[name] reports no beds available at this time, CSW faxing out more referrals for potential bed offers for patient.   Patient made aware of his preference [name] not having a bed available at this time.   UPDATE: [name] reports a cancellation and they have one bed avail. Dr. Ninfa Linden made aware, plan for DC tomorrow morning 4/22.   [place], [place]

## 2018-08-31 NOTE — Evaluation (Signed)
Occupational Therapy Evaluation Patient Details Name: [name] MRN: [id number] DOB: [birth year] Today's Date: 08/31/2018    History of Present Illness 68 yo male readmitted 04/18 due to subluxation L hip while sitting on the tub bench and had a dislocation. He currently is wearing a hip stabilization brace. S/p Left total hip arthroplasty through direct anterior approach 4/3 and 08/15/18 Pt self dislocated L hip attempting to put on shoes to go home from [facility] then Returned to OR for relocation of hip. s/p L hip arthroplasty repair 08/17/18. pt discharged to home on 4/10 prior to this readmission.  PMH: CAD, RA, recurrent DVT ; multiple back surgeries    Clinical Impression   Patient is s/p L THA posterior precautions surgery resulting in functional limitations due to the deficits listed below (see OT problem list). Pt requires total (A) for L LE dressing. Pt is at risk for subluxation with any anterior pelvic tilt and has a hip brace currently. Dr Ninfa Linden reports peri care only in static standing or completely supine.  Patient will benefit from skilled OT acutely to increase independence and safety with ADLS to allow discharge SNF.     Follow Up Recommendations  SNF    Equipment Recommendations  3 in 1 bedside commode    Recommendations for Other Services       Precautions / Restrictions Precautions Precautions: Posterior Hip Precaution Comments: able to restate posterior hip precautions  Required Braces or Orthoses: Other Brace Knee Immobilizer - Left: On at all times Other Brace: per dr Ninfa Linden. the hip brace should only be doff supine to prevent subluxation. Brace can be doff in standing only if patient is completely upright. pt can not be flexed at the hips or he will sublux.  Restrictions LLE Weight Bearing: Weight bearing as tolerated      Mobility Bed Mobility   Bed Mobility: Supine to Sit     Supine to sit: Min guard     General bed mobility comments: pt able to  progress to EOB with incr time. pt requires BIL UE to left L LE toward EOB  Transfers Overall transfer level: Needs assistance Equipment used: Rolling walker (2 wheeled) Transfers: Sit to/from Stand Sit to Stand: Min guard;From elevated surface         General transfer comment: pt able to power up with elevated surface.     Balance                                           ADL either performed or assessed with clinical judgement   ADL Overall ADL's : Needs assistance/impaired Eating/Feeding: Independent;Sitting           Lower Body Bathing: Maximal assistance       Lower Body Dressing: Maximal assistance Lower Body Dressing Details (indicate cue type and reason): pt is able to doff the straps and verbalize the hip brace during session without (A) Toilet Transfer: Minimal assistance;RW Toilet Transfer Details (indicate cue type and reason): per Dr Ninfa Linden he will have to static stand upright for peri care to prevent from subluxation         Functional mobility during ADLs: Min guard;Rolling walker       Vision Baseline Vision/History: Wears glasses Wears Glasses: Reading only       Perception     Praxis      Pertinent Vitals/Pain Pain Assessment:  Faces Faces Pain Scale: Hurts little more Pain Location: hip L Pain Descriptors / Indicators: Sore Pain Intervention(s): Monitored during session;Premedicated before session;Repositioned     Hand Dominance Right   Extremity/Trunk Assessment         Cervical / Trunk Assessment Cervical / Trunk Assessment: Kyphotic   Communication Communication Communication: No difficulties   Cognition Arousal/Alertness: Awake/alert Behavior During Therapy: WFL for tasks assessed/performed Overall Cognitive Status: Within Functional Limits for tasks assessed                                     General Comments  pt with noticeable swelling in L LE and educated on ankle pumps to help  with edema. pt with notice bruising throughout L side and sacrum area    Exercises Exercises: Other exercises Other Exercises Other Exercises: ankle pumps 20 reps   Shoulder Instructions      Home Living Family/patient expects to be discharged to:: Skilled nursing facility                                        Prior Functioning/Environment Level of Independence: Needs assistance  Gait / Transfers Assistance Needed: rolling walker ADL's / Homemaking Assistance Needed: pt required (A) with ADLS due to posterior hip precautions            OT Problem List: Decreased strength;Decreased range of motion;Decreased activity tolerance;Impaired balance (sitting and/or standing);Decreased knowledge of use of DME or AE;Decreased safety awareness;Decreased knowledge of precautions;Pain;Increased edema      OT Treatment/Interventions: Self-care/ADL training;DME and/or AE instruction;Patient/family education;Therapeutic activities    OT Goals(Current goals can be found in the care plan section) Acute Rehab OT Goals Patient Stated Goal: going to have another surgery OT Goal Formulation: With patient Time For Goal Achievement: 09/14/18 Potential to Achieve Goals: Good  OT Frequency: Min 2X/week   Barriers to D/C: Decreased caregiver support  very high risk for subluxation       Co-evaluation              AM-PAC OT "6 Clicks" Daily Activity     Outcome Measure Help from another person eating meals?: None Help from another person taking care of personal grooming?: A Little Help from another person toileting, which includes using toliet, bedpan, or urinal?: A Lot Help from another person bathing (including washing, rinsing, drying)?: A Lot Help from another person to put on and taking off regular upper body clothing?: None Help from another person to put on and taking off regular lower body clothing?: A Lot 6 Click Score: 17   End of Session Equipment Utilized  During Treatment: Other (comment)(hip brace) Nurse Communication: Mobility status;Precautions  Activity Tolerance: Patient tolerated treatment well Patient left: in chair;with call bell/phone within reach  OT Visit Diagnosis: Unsteadiness on feet (R26.81);Muscle weakness (generalized) (M62.81) Pain - Right/Left: Left Pain - part of body: Hip                Time: 1139-1203 OT Time Calculation (min): 24 min Charges:  OT General Charges $OT Visit: 1 Visit OT Evaluation $OT Eval Moderate Complexity: 1 Mod   Jeri Modena, OTR/L  Acute Rehabilitation Services Pager: [phone] Office: [phone] .   Jeri Modena 08/31/2018, 1:38 PM

## 2018-08-31 NOTE — Progress Notes (Signed)
Patient ID: [name], male   DOB: 1951/01/28, 68 y.o.   MRN: [id number] WBC normal this am and Hgb/Hct stable.  He is afebrile with stable vitals.  His left hip abduction brace is in place.  Ready for skilled nursing placement.

## 2018-08-31 NOTE — Plan of Care (Signed)
  Problem: Safety: Goal: Ability to remain free from injury will improve Outcome: Progressing   Problem: Skin Integrity: Goal: Risk for impaired skin integrity will decrease Outcome: Progressing   

## 2018-09-01 DIAGNOSIS — Z981 Arthrodesis status: Secondary | ICD-10-CM | POA: Diagnosis not present

## 2018-09-01 DIAGNOSIS — J302 Other seasonal allergic rhinitis: Secondary | ICD-10-CM | POA: Diagnosis present

## 2018-09-01 DIAGNOSIS — I82409 Acute embolism and thrombosis of unspecified deep veins of unspecified lower extremity: Secondary | ICD-10-CM | POA: Diagnosis not present

## 2018-09-01 DIAGNOSIS — J8489 Other specified interstitial pulmonary diseases: Secondary | ICD-10-CM | POA: Diagnosis not present

## 2018-09-01 DIAGNOSIS — Z951 Presence of aortocoronary bypass graft: Secondary | ICD-10-CM | POA: Diagnosis not present

## 2018-09-01 DIAGNOSIS — I252 Old myocardial infarction: Secondary | ICD-10-CM | POA: Diagnosis not present

## 2018-09-01 DIAGNOSIS — Z471 Aftercare following joint replacement surgery: Secondary | ICD-10-CM | POA: Diagnosis not present

## 2018-09-01 DIAGNOSIS — F39 Unspecified mood [affective] disorder: Secondary | ICD-10-CM | POA: Diagnosis not present

## 2018-09-01 DIAGNOSIS — K5901 Slow transit constipation: Secondary | ICD-10-CM | POA: Diagnosis not present

## 2018-09-01 DIAGNOSIS — Z21 Asymptomatic human immunodeficiency virus [HIV] infection status: Secondary | ICD-10-CM | POA: Diagnosis present

## 2018-09-01 DIAGNOSIS — R41841 Cognitive communication deficit: Secondary | ICD-10-CM | POA: Diagnosis not present

## 2018-09-01 DIAGNOSIS — T84021S Dislocation of internal left hip prosthesis, sequela: Secondary | ICD-10-CM | POA: Diagnosis not present

## 2018-09-01 DIAGNOSIS — Z8739 Personal history of other diseases of the musculoskeletal system and connective tissue: Secondary | ICD-10-CM | POA: Diagnosis not present

## 2018-09-01 DIAGNOSIS — Z03818 Encounter for observation for suspected exposure to other biological agents ruled out: Secondary | ICD-10-CM | POA: Diagnosis not present

## 2018-09-01 DIAGNOSIS — D539 Nutritional anemia, unspecified: Secondary | ICD-10-CM | POA: Diagnosis not present

## 2018-09-01 DIAGNOSIS — M6281 Muscle weakness (generalized): Secondary | ICD-10-CM | POA: Diagnosis not present

## 2018-09-01 DIAGNOSIS — D649 Anemia, unspecified: Secondary | ICD-10-CM | POA: Diagnosis not present

## 2018-09-01 DIAGNOSIS — M25552 Pain in left hip: Secondary | ICD-10-CM | POA: Diagnosis present

## 2018-09-01 DIAGNOSIS — M24452 Recurrent dislocation, left hip: Secondary | ICD-10-CM | POA: Diagnosis present

## 2018-09-01 DIAGNOSIS — S73005S Unspecified dislocation of left hip, sequela: Secondary | ICD-10-CM | POA: Diagnosis not present

## 2018-09-01 DIAGNOSIS — M2550 Pain in unspecified joint: Secondary | ICD-10-CM | POA: Diagnosis not present

## 2018-09-01 DIAGNOSIS — M069 Rheumatoid arthritis, unspecified: Secondary | ICD-10-CM | POA: Diagnosis present

## 2018-09-01 DIAGNOSIS — R5381 Other malaise: Secondary | ICD-10-CM | POA: Diagnosis not present

## 2018-09-01 DIAGNOSIS — T84021A Dislocation of internal left hip prosthesis, initial encounter: Secondary | ICD-10-CM | POA: Diagnosis present

## 2018-09-01 DIAGNOSIS — R2689 Other abnormalities of gait and mobility: Secondary | ICD-10-CM | POA: Diagnosis not present

## 2018-09-01 DIAGNOSIS — Z856 Personal history of leukemia: Secondary | ICD-10-CM | POA: Diagnosis not present

## 2018-09-01 DIAGNOSIS — Z8673 Personal history of transient ischemic attack (TIA), and cerebral infarction without residual deficits: Secondary | ICD-10-CM | POA: Diagnosis not present

## 2018-09-01 DIAGNOSIS — S71102A Unspecified open wound, left thigh, initial encounter: Secondary | ICD-10-CM | POA: Diagnosis not present

## 2018-09-01 DIAGNOSIS — G629 Polyneuropathy, unspecified: Secondary | ICD-10-CM | POA: Diagnosis present

## 2018-09-01 DIAGNOSIS — Z96642 Presence of left artificial hip joint: Secondary | ICD-10-CM | POA: Diagnosis not present

## 2018-09-01 DIAGNOSIS — R278 Other lack of coordination: Secondary | ICD-10-CM | POA: Diagnosis not present

## 2018-09-01 DIAGNOSIS — M879 Osteonecrosis, unspecified: Secondary | ICD-10-CM | POA: Diagnosis present

## 2018-09-01 DIAGNOSIS — Z7401 Bed confinement status: Secondary | ICD-10-CM | POA: Diagnosis not present

## 2018-09-01 DIAGNOSIS — Z1159 Encounter for screening for other viral diseases: Secondary | ICD-10-CM | POA: Diagnosis not present

## 2018-09-01 DIAGNOSIS — M810 Age-related osteoporosis without current pathological fracture: Secondary | ICD-10-CM | POA: Diagnosis present

## 2018-09-01 DIAGNOSIS — Z79899 Other long term (current) drug therapy: Secondary | ICD-10-CM | POA: Diagnosis not present

## 2018-09-01 DIAGNOSIS — M87052 Idiopathic aseptic necrosis of left femur: Secondary | ICD-10-CM | POA: Diagnosis not present

## 2018-09-01 DIAGNOSIS — B349 Viral infection, unspecified: Secondary | ICD-10-CM | POA: Diagnosis not present

## 2018-09-01 DIAGNOSIS — Z96649 Presence of unspecified artificial hip joint: Secondary | ICD-10-CM | POA: Diagnosis not present

## 2018-09-01 DIAGNOSIS — S73005A Unspecified dislocation of left hip, initial encounter: Secondary | ICD-10-CM | POA: Diagnosis not present

## 2018-09-01 DIAGNOSIS — F418 Other specified anxiety disorders: Secondary | ICD-10-CM | POA: Diagnosis not present

## 2018-09-01 DIAGNOSIS — K219 Gastro-esophageal reflux disease without esophagitis: Secondary | ICD-10-CM | POA: Diagnosis present

## 2018-09-01 DIAGNOSIS — T07XXXA Unspecified multiple injuries, initial encounter: Secondary | ICD-10-CM | POA: Diagnosis not present

## 2018-09-01 DIAGNOSIS — R2681 Unsteadiness on feet: Secondary | ICD-10-CM | POA: Diagnosis not present

## 2018-09-01 DIAGNOSIS — M40209 Unspecified kyphosis, site unspecified: Secondary | ICD-10-CM | POA: Diagnosis present

## 2018-09-01 DIAGNOSIS — I2510 Atherosclerotic heart disease of native coronary artery without angina pectoris: Secondary | ICD-10-CM | POA: Diagnosis present

## 2018-09-01 DIAGNOSIS — B20 Human immunodeficiency virus [HIV] disease: Secondary | ICD-10-CM | POA: Diagnosis not present

## 2018-09-01 DIAGNOSIS — T84020S Dislocation of internal right hip prosthesis, sequela: Secondary | ICD-10-CM | POA: Diagnosis not present

## 2018-09-01 DIAGNOSIS — Z880 Allergy status to penicillin: Secondary | ICD-10-CM | POA: Diagnosis not present

## 2018-09-01 DIAGNOSIS — T84029A Dislocation of unspecified internal joint prosthesis, initial encounter: Secondary | ICD-10-CM | POA: Diagnosis not present

## 2018-09-01 DIAGNOSIS — I10 Essential (primary) hypertension: Secondary | ICD-10-CM | POA: Diagnosis present

## 2018-09-01 DIAGNOSIS — T84020D Dislocation of internal right hip prosthesis, subsequent encounter: Secondary | ICD-10-CM | POA: Diagnosis not present

## 2018-09-01 DIAGNOSIS — Y792 Prosthetic and other implants, materials and accessory orthopedic devices associated with adverse incidents: Secondary | ICD-10-CM | POA: Diagnosis present

## 2018-09-01 DIAGNOSIS — J849 Interstitial pulmonary disease, unspecified: Secondary | ICD-10-CM | POA: Diagnosis not present

## 2018-09-01 DIAGNOSIS — D62 Acute posthemorrhagic anemia: Secondary | ICD-10-CM | POA: Diagnosis not present

## 2018-09-01 DIAGNOSIS — N529 Male erectile dysfunction, unspecified: Secondary | ICD-10-CM | POA: Diagnosis present

## 2018-09-01 DIAGNOSIS — R0602 Shortness of breath: Secondary | ICD-10-CM | POA: Diagnosis not present

## 2018-09-01 DIAGNOSIS — Z4789 Encounter for other orthopedic aftercare: Secondary | ICD-10-CM | POA: Diagnosis not present

## 2018-09-01 DIAGNOSIS — Z8249 Family history of ischemic heart disease and other diseases of the circulatory system: Secondary | ICD-10-CM | POA: Diagnosis not present

## 2018-09-01 DIAGNOSIS — Z823 Family history of stroke: Secondary | ICD-10-CM | POA: Diagnosis not present

## 2018-09-01 MED ORDER — HYDROCODONE-ACETAMINOPHEN 7.5-325 MG PO TABS: 1.0000 | ORAL_TABLET | Freq: Four times a day (QID) | ORAL | 0 refills | Status: DC | PRN

## 2018-09-01 MED ORDER — BISACODYL 10 MG RE SUPP
10.0000 mg | Freq: Every day | RECTAL | Status: DC | PRN
  Administered 2018-09-01: 10 mg via RECTAL
  Filled 2018-09-01: qty 1

## 2018-09-01 NOTE — Care Management Important Message (Signed)
Important Message  Patient Details  Name: [name] MRN: [id number] Date of Birth: 1951-01-11   Medicare Important Message Given:  Yes    [name] 09/01/2018, 1:23 PM

## 2018-09-01 NOTE — Progress Notes (Signed)
PT Cancellation Note  Patient Details Name: [name] MRN: [id number] DOB: 1950/10/07   Cancelled Treatment:    Reason Eval/Treat Not Completed: (P) Fatigue/lethargy limiting ability to participate(Pt reports fatigue from just returning to bed after BM.  Will f/u pending how soon transportation arrives to transfer patient to rehab. )   [name] 09/01/2018, 11:05 AM  Governor Rooks, PTA Acute Rehabilitation Services Pager [phone] Office [phone]

## 2018-09-01 NOTE — TOC Transition Note (Addendum)
Transition of Care [facility]) - CM/SW Discharge Note   Patient Details  Name: GEDEON BRANDOW MRN: [id number] Date of Birth: 05-Jul-1950  [facility] CM/SW Contact:  Alberteen Sam, LCSW Phone Number: 09/01/2018, 8:50 AM   Clinical Narrative:     Patient will DC to: [facility] Anticipated DC date:09/01/2018 Family notified: [name] Transport TM:[id number]  Per MD patient ready for DC to [facility] . RN, patient, patient's family, and facility notified of DC. Discharge Summary sent to facility. RN given number for report [phone] Room 1202P. DC packet on chart. Ambulance transport requested for patient for 1:00pm.  CSW signing off.  [place], [place]    Final next level of care: [place] Barriers to Discharge: No Barriers Identified   Patient Goals and CMS Choice Patient states their goals for this hospitalization and ongoing recovery are:: to go to [place] then home CMS Medicare.gov Compare Post Acute Care list provided to:: Patient Choice offered to / list presented to : Patient  Discharge Placement PASRR number recieved: 08/30/18            Patient [facility] bed at: [facility] Patient to be transferred to facility by: [place] Name of family member notified: [name] (daughter) Patient and family notified of of transfer: 09/01/18  Discharge Plan and Services   Discharge Planning Services: NA Post Acute Care Choice: [place]          DME Arranged: N/A DME Agency: NA HH Arranged: NA HH Agency: NA   Social Determinants of Health (SDOH) Interventions     Readmission Risk Interventions No flowsheet data found.

## 2018-09-01 NOTE — Discharge Summary (Signed)
Patient ID: [name] MRN: [id number] DOB/AGE: [birth year] 70 y.o.  Admit date: 08/28/2018 Discharge date: 09/01/2018  Admission Diagnoses:  Principal Problem:   Recurrent dislocation of hip joint prosthesis [facility])   Discharge Diagnoses:  Same  Past Medical History:  Diagnosis Date  . Allergy   . Anxiety   . Carotid artery occlusion    40-60% right ICA stenosis (09/2008)  . Cataract   . Chronic back pain   . CLL (chronic lymphoblastic leukemia) dx 2010   Followed at [facility] q66mo, no current therapy   . Clotting disorder ([name])   . Coronary artery disease 2010   s/p CABG '10, sees Dr. Percival Spanish  . Depression   . Diverticulosis   . DVT, lower extremity, recurrent ([place]) 2008, 2009   LLE, chronic anticoag since 2009  . Esophagitis   . Fibromyalgia   . Gallstones   . GERD (gastroesophageal reflux disease)   . Gout   . Gynecomastia, male   . H/O hiatal hernia 2008   surgery  . Hemorrhoids   . Hepatitis A yrs ago  . HIV infection ([place]) dx 1993  . Hypertension   . Impotence of organic origin   . Myocardial infarction ([place]) 2010    x 2  . Neuromuscular disorder ([facility])    neuropathy  . Osteoarthritis, knee    s/p B TKA  . Osteoporosis   . Pneumonia mrach, may, july 2017  . Rheumatoid arthritis(714.0) dx 2010   MTX, follows with rheum  . Seasonal allergies   . Secondary syphilis 07/24/14 dx   s/p 2 wks doxy  . Status post dilation of esophageal narrowing   . Stroke ([place]) [place]   . TIA (transient ischemic attack) 1997   mild residual L mouth droop  . Tubular adenoma of colon     Surgeries: Procedure(s): CLOSED REDUCTION HIP FOR RECURRENT DISLOCATION AND DRESSING CHANGE on 08/28/2018   Consultants: Treatment Team:  Mcarthur Rossetti, MD  Discharged Condition: Improved  Hospital Course: [name] is an 68 y.o. male who was admitted 08/28/2018 for operative treatment ofRecurrent dislocation of hip joint prosthesis ([place]). Patient has severe unremitting  pain that affects sleep, daily activities, and work/hobbies. After pre-op clearance the patient was taken to the operating room on 08/28/2018 and underwent  Procedure(s): CLOSED REDUCTION HIP FOR RECURRENT DISLOCATION AND DRESSING CHANGE.    Patient was given perioperative antibiotics:  Anti-infectives (From admission, onward)   Start     Dose/Rate Route Frequency Ordered Stop   08/29/18 1000  valACYclovir (VALTREX) tablet 500 mg     500 mg Oral Daily 08/28/18 1744     08/28/18 2200  doxycycline (VIBRA-TABS) tablet 100 mg     100 mg Oral 2 times daily 08/28/18 1744     08/28/18 2200  sulfamethoxazole-trimethoprim (BACTRIM DS) 800-160 MG per tablet 1 tablet     1 tablet Oral Every 12 hours 08/28/18 1744     08/28/18 1930  darunavir (PREZISTA) tablet 800 mg    Note to Pharmacy:  TAKE 1 TABLET (800 MG TOTAL) BY MOUTH DAILY.     800 mg Oral Daily with breakfast 08/28/18 1744     08/28/18 1930  elvitegravir-cobicistat-emtricitabine-tenofovir (GENVOYA) 150-150-200-10 MG tablet 1 tablet     1 tablet Oral Daily with breakfast 08/28/18 1744         Patient was given sequential compression devices, early ambulation, and chemoprophylaxis to prevent DVT.  Patient benefited maximally from hospital stay and there  were no complications.    Recent vital signs:  Patient Vitals for the past 24 hrs:  BP Temp Temp src Pulse Resp SpO2  09/01/18 0528 111/86 97.7 F (36.5 C) Oral 91 18 98 %  08/31/18 2036 113/73 98.2 F (36.8 C) Oral 91 18 98 %  08/31/18 1306 92/62 98.1 F (36.7 C) Oral (!) 101 16 91 %     Recent laboratory studies: No results for input(s): WBC, HGB, HCT, PLT, NA, K, CL, CO2, BUN, CREATININE, GLUCOSE, INR, CALCIUM in the last 72 hours.  Invalid input(s): PT, 2   Discharge Medications:   Allergies as of 09/01/2018      Reactions   Golimumab Anaphylaxis   Simponi) ARIA   Orencia [abatacept] Anaphylaxis   Other Anaphylaxis, Hives   Pecans   Peanut-containing Drug Products  Anaphylaxis, Hives, Swelling   Swelling of throat   Morphine Other (See Comments)   Severe headache- Can tolerate Dilaudid, however   Oxycodone-acetaminophen Other (See Comments)   Headache   Penicillins Rash, Other (See Comments)   FLUSHED, RED Has patient had a PCN reaction causing immediate rash, facial/tongue/throat swelling, SOB or lightheadedness with hypotension: Yes Has patient had a PCN reaction causing severe rash involving mucus membranes or skin necrosis: No Has patient had a PCN reaction that required hospitalization: No Has patient had a PCN reaction occurring within the last 10 years: No If all of the above answers are "NO", then may proceed with Cephalosporin use   Promethazine Hcl Other (See Comments)   Makes him feel "drunk" at higher strengths      Medication List    STOP taking these medications   HYDROmorphone 2 MG tablet Commonly known as:  DILAUDID     TAKE these medications   acetaminophen 500 MG tablet Commonly known as:  TYLENOL Take 500-1,000 mg by mouth every 6 (six) hours as needed for mild pain or headache.   atorvastatin 10 MG tablet Commonly known as:  LIPITOR Take 1 tablet (10 mg total) by mouth at bedtime.   clindamycin 150 MG capsule Commonly known as:  CLEOCIN Take 600 mg by mouth See admin instructions. Take 4 capsules (600 mg) by mouth 1 hour prior to dental appointments   darunavir 800 MG tablet Commonly known as:  Prezista TAKE 1 TABLET (800 MG TOTAL) BY MOUTH DAILY. What changed:    how much to take  how to take this  when to take this  additional instructions   dextromethorphan 30 MG/5ML liquid Commonly known as:  Delsym Take 5 mLs (30 mg total) by mouth 2 (two) times daily.   doxycycline 100 MG tablet Commonly known as:  VIBRA-TABS Take 1 tablet (100 mg total) by mouth 2 (two) times daily.   elvitegravir-cobicistat-emtricitabine-tenofovir 150-150-200-10 MG Tabs tablet Commonly known as:  Genvoya Take 1 tablet by  mouth daily with breakfast.   escitalopram 20 MG tablet Commonly known as:  LEXAPRO Take 20 mg by mouth daily.   famotidine 40 MG tablet Commonly known as:  PEPCID TAKE 1 TABLET BY MOUTH TWICE DAILY FOR 2 MONTHS What changed:  See the new instructions.   folic acid 1 MG tablet Commonly known as:  FOLVITE Take 1 mg by mouth daily.   furosemide 20 MG tablet Commonly known as:  LASIX Take 1 tablet (20 mg total) by mouth 2 (two) times daily.   HYDROcodone-acetaminophen 7.5-325 MG tablet Commonly known as:  NORCO Take 1-2 tablets by mouth every 6 (six) hours as needed for  severe pain.   leflunomide 20 MG tablet Commonly known as:  ARAVA Take 20 mg by mouth daily.   nitroGLYCERIN 0.4 MG SL tablet Commonly known as:  NITROSTAT Place 1 tablet (0.4 mg total) under the tongue every 5 (five) minutes as needed for chest pain.   predniSONE 5 MG tablet Commonly known as:  DELTASONE Take 5 mg by mouth 2 (two) times daily.   pregabalin 200 MG capsule Commonly known as:  LYRICA TAKE 1 CAPSULE BY MOUTH TWICE A DAY   Rexulti 0.25 MG Tabs Generic drug:  Brexpiprazole Take 0.25 mg by mouth daily.   Savaysa 30 MG Tabs tablet Generic drug:  edoxaban TAKE 1 TABLET (30 MG TOTAL) BY MOUTH DAILY.   sulfamethoxazole-trimethoprim 800-160 MG tablet Commonly known as:  BACTRIM DS TAKE 1 TABLET BY MOUTH EVERY 12 (TWELVE) HOURS.   valACYclovir 500 MG tablet Commonly known as:  VALTREX Take 1 tablet (500 mg total) by mouth daily.       Diagnostic Studies: Dg Pelvis Portable  Result Date: 08/17/2018 CLINICAL DATA:  Status post total left hip replacement. EXAM: PORTABLE PELVIS 1-2 VIEWS COMPARISON:  Operative images obtained earlier today. FINDINGS: Single view shows the left hip total arthroplasty to be well seated and aligned. No evidence of a new fracture or operative complication. IMPRESSION: Well-positioned total left hip arthroplasty. Electronically Signed   By: Lajean Manes M.D.   On:  08/17/2018 15:12   Dg Pelvis Portable  Result Date: 08/15/2018 CLINICAL DATA:  Closed reduction of left hip earlier today. EXAM: PORTABLE PELVIS 1-2 VIEWS COMPARISON:  08/13/2018; 08/09/2018 FINDINGS: Post left total hip replacement. Alignment appears anatomic given AP projection. No definite fracture or dislocation. No evidence of hardware failure or loosening. Skin staples are noted about the superolateral aspect of the left hip. Expected adjacent subcutaneous emphysema. No radiopaque foreign body. Limited visualization of the pelvis and contralateral right hip suggests moderate degenerative change of the right hip with joint space loss, subchondral sclerosis and osteophytosis. IMPRESSION: Post left total hip replacement without evidence of complication. Electronically Signed   By: Sandi Mariscal M.D.   On: 08/15/2018 14:12   Dg Pelvis Portable  Result Date: 08/13/2018 CLINICAL DATA:  Status post left total hip replacement. EXAM: PORTABLE PELVIS 1-2 VIEWS COMPARISON:  Intraoperative radiographs dated 08/13/2018 and MRI dated 06/03/2018 FINDINGS: The components of the left total hip prosthesis appear in excellent position in the AP projection. Postsurgical air in the soft tissues around the left hip. The visualized pelvic bones are normal. Previous lower lumbar fusion. IMPRESSION: Satisfactory appearance of the pelvis in the AP projection after left total hip prosthesis insertion. Electronically Signed   By: Lorriane Shire M.D.   On: 08/13/2018 10:49   Dg Chest Port 1 View  Result Date: 08/30/2018 CLINICAL DATA:  Cough and wheezing EXAM: PORTABLE CHEST 1 VIEW COMPARISON:  08/28/2018 FINDINGS: Normal heart size. Lungs are under aerated with bibasilar atelectasis. No pneumothorax or pleural effusion. IMPRESSION: Bibasilar atelectasis. Electronically Signed   By: Marybelle Killings M.D.   On: 08/30/2018 16:31   Dg Chest Port 1 View  Result Date: 08/28/2018 CLINICAL DATA:  Preoperative study.  Multiple hip  dislocations. EXAM: PORTABLE CHEST 1 VIEW COMPARISON:  August 17, 2018 FINDINGS: No pneumothorax. The cardiomediastinal silhouette is stable with mild cardiomegaly. Mild opacity at the left lung base is stable, likely scar atelectasis. The right lung is clear. No other changes. IMPRESSION: Stable opacity in left base favored represent scar atelectasis given stability. No  acute abnormality or change. Electronically Signed   By: Dorise Bullion III M.D   On: 08/28/2018 14:57   Dg Chest Port 1 View  Result Date: 08/17/2018 CLINICAL DATA:  Central line insertion. EXAM: PORTABLE CHEST 1 VIEW COMPARISON:  05/20/2016 FINDINGS: Left internal jugular central venous line has its catheter tip in the lower superior vena cava. No pneumothorax. Cardiac silhouette is normal in size. Stable changes from prior CABG surgery. No mediastinal or hilar masses. There are linear opacities in both lung bases, greater on the left, similar to the prior study, consistent with scarring, atelectasis or a combination. Small stable granuloma in the right upper lobe. Lungs otherwise clear. Skeletal structures are grossly intact. IMPRESSION: 1. Left internal jugular central venous catheter tip projects in the lower superior vena cava. No pneumothorax. 2. No acute cardiopulmonary disease. Electronically Signed   By: Lajean Manes M.D.   On: 08/17/2018 15:08   Dg C-arm 1-60 Min  Result Date: 08/28/2018 CLINICAL DATA:  68 year old male undergoing closed reduction of prosthetic left hip dislocation. EXAM: DG HIP (WITH OR WITHOUT PELVIS) 1V*L*; DG C-ARM 61-120 MIN COMPARISON:  1018 hours earlier today. Intraoperative hip arthroplasty images 08/17/18. FLUOROSCOPY TIME:  0 minutes 12 seconds. FINDINGS: Single AP fluoroscopic spot view at 1628 hours demonstrates normal AP alignment of the total left hip arthroplasty components. IMPRESSION: Left hip dislocation appears reduced on a single AP fluoroscopic spot image. Electronically Signed   By: Genevie Ann  M.D.   On: 08/28/2018 18:33   Dg C-arm 1-60 Min  Result Date: 08/17/2018 CLINICAL DATA:  Left total hip revision. EXAM: DG C-ARM 61-120 MIN; OPERATIVE LEFT HIP WITH PELVIS COMPARISON:  Radiograph dated 08/15/2018 FINDINGS: AP views of the left hip demonstrate that the acetabular and femoral components appear in good position in the AP projection. No fractures. IMPRESSION: Revision of left total hip prosthesis performed. Components appear in good position in the AP projection. FLUOROSCOPY TIME:  59 seconds C-arm fluoroscopic images were obtained intraoperatively and submitted for post operative interpretation. Electronically Signed   By: Lorriane Shire M.D.   On: 08/17/2018 14:07   Dg C-arm 1-60 Min  Result Date: 08/13/2018 CLINICAL DATA:  68 year old male with elective hip surgery EXAM: OPERATIVE LEFT HIP (WITH PELVIS IF PERFORMED)  VIEWS TECHNIQUE: Fluoroscopic spot image(s) were submitted for interpretation post-operatively. COMPARISON:  08/09/2018 FINDINGS: Limited intraoperative fluoroscopic spot images demonstrates left hip arthroplasty. IMPRESSION: Left hip arthroplasty. Please refer to the dictated operative report for full details of intraoperative findings and procedure. Electronically Signed   By: Corrie Mckusick D.O.   On: 08/13/2018 09:47   Dg Hip Unilat With Pelvis 1v Left  Result Date: 08/28/2018 CLINICAL DATA:  68 year old male undergoing closed reduction of prosthetic left hip dislocation. EXAM: DG HIP (WITH OR WITHOUT PELVIS) 1V*L*; DG C-ARM 61-120 MIN COMPARISON:  1018 hours earlier today. Intraoperative hip arthroplasty images 08/17/18. FLUOROSCOPY TIME:  0 minutes 12 seconds. FINDINGS: Single AP fluoroscopic spot view at 1628 hours demonstrates normal AP alignment of the total left hip arthroplasty components. IMPRESSION: Left hip dislocation appears reduced on a single AP fluoroscopic spot image. Electronically Signed   By: Genevie Ann M.D.   On: 08/28/2018 18:33   Dg Hip Port Unilat With  Pelvis 1v Left  Result Date: 08/15/2018 CLINICAL DATA:  Left hip pain EXAM: DG HIP (WITH OR WITHOUT PELVIS) 1V PORT LEFT COMPARISON:  None. FINDINGS: Total left hip arthroplasty with dislocation. No evidence of fracture on the single projection IMPRESSION: Dislocated  left hip arthroplasty. Electronically Signed   By: Monte Fantasia M.D.   On: 08/15/2018 10:47   Dg Hip Operative Unilat W Or W/o Pelvis Left  Result Date: 08/17/2018 CLINICAL DATA:  Left total hip revision. EXAM: DG C-ARM 61-120 MIN; OPERATIVE LEFT HIP WITH PELVIS COMPARISON:  Radiograph dated 08/15/2018 FINDINGS: AP views of the left hip demonstrate that the acetabular and femoral components appear in good position in the AP projection. No fractures. IMPRESSION: Revision of left total hip prosthesis performed. Components appear in good position in the AP projection. FLUOROSCOPY TIME:  59 seconds C-arm fluoroscopic images were obtained intraoperatively and submitted for post operative interpretation. Electronically Signed   By: Lorriane Shire M.D.   On: 08/17/2018 14:07   Dg Hip Operative Unilat W Or W/o Pelvis Left  Result Date: 08/13/2018 CLINICAL DATA:  68 year old male with elective hip surgery EXAM: OPERATIVE LEFT HIP (WITH PELVIS IF PERFORMED)  VIEWS TECHNIQUE: Fluoroscopic spot image(s) were submitted for interpretation post-operatively. COMPARISON:  08/09/2018 FINDINGS: Limited intraoperative fluoroscopic spot images demonstrates left hip arthroplasty. IMPRESSION: Left hip arthroplasty. Please refer to the dictated operative report for full details of intraoperative findings and procedure. Electronically Signed   By: Corrie Mckusick D.O.   On: 08/13/2018 09:47   Xr Hip Unilat W Or W/o Pelvis 1v Left  Result Date: 08/09/2018 An AP pelvis and a lateral of the left hip shows avascular necrosis with flattening of the femoral head and evidence of collapse.   Disposition: Discharge disposition: 03-Skilled [place]    Mcarthur Rossetti, MD. Schedule an appointment as soon as possible for a visit in 1 week(s).   Specialty:  Orthopedic Surgery Why:  For wound re-check Contact information: [address] [place] [address] [phone]            Signed: Mcarthur Rossetti 09/01/2018, 8:07 AM

## 2018-09-01 NOTE — Progress Notes (Signed)
Physical Therapy Treatment Patient Details Name: [name] MRN: [id number] DOB: 05-27-1950 Today's Date: 09/01/2018    History of Present Illness 68 yo male readmitted 04/18 due to subluxation L hip while sitting on the tub bench and had a dislocation. He currently is wearing a hip stabilization brace. S/p Left total hip arthroplasty through direct anterior approach 4/3 and 08/15/18 Pt self dislocated L hip attempting to put on shoes to go home from [facility] then Returned to OR for relocation of hip. s/p L hip arthroplasty repair 08/17/18. pt discharged to home on 4/10 prior to this readmission.  PMH: CAD, RA, recurrent DVT ; multiple back surgeries     PT Comments    Pt performed gait training and functional mobility. ON arrival pt supine in bed with c/o abdominal discomfort from need to have a BM.  Pt agreeable to mobilize after education and encouragement.  Plan next session for continued strengthening as patient continues to fatigue quickly.      Follow Up Recommendations  Follow surgeon's recommendation for DC plan and follow-up therapies     Equipment Recommendations  None recommended by PT    Recommendations for Other Services       Precautions / Restrictions Precautions Precautions: Posterior Hip Precaution Booklet Issued: Yes (comment) Precaution Comments: able to restate posterior hip precautions  Knee Immobilizer - Left: On at all times Other Brace: per dr Ninfa Linden. the hip brace should only be doff supine to prevent subluxation. Brace can be doff in standing only if patient is completely upright. pt can not be flexed at the hips or he will sublux.  Restrictions Weight Bearing Restrictions: Yes LLE Weight Bearing: Weight bearing as tolerated    Mobility  Bed Mobility Overal bed mobility: Needs Assistance Bed Mobility: Supine to Sit;Sit to Supine     Supine to sit: Supervision Sit to supine: Min assist   General bed mobility comments: Heavy reliance on railing  utilized foot board to press against to move hips to edge of bed.  Pt required min assistance to lift LLE against gravity to return to bed.    Transfers Overall transfer level: Needs assistance Equipment used: Rolling walker (2 wheeled) Transfers: Sit to/from Stand Sit to Stand: Min guard;From elevated surface         General transfer comment: pt able to power up with elevated surface. Required cues to advance LLE forward to avoid excessive hip flexion.    Ambulation/Gait Ambulation/Gait assistance: Min assist Gait Distance (Feet): 20 Feet Assistive device: Rolling walker (2 wheeled) Gait Pattern/deviations: Step-to pattern;Trunk flexed;Narrow base of support;Shuffle Gait velocity: decreased   General Gait Details: Cues to ER L foot during L turns to avoid pivot.  pt with chronic flexed forward posture.     Stairs             Wheelchair Mobility    Modified Rankin (Stroke Patients Only)       Balance Overall balance assessment: Needs assistance   Sitting balance-[name]: Good       Standing balance-[name]: Poor                              Cognition Arousal/Alertness: Awake/alert Behavior During Therapy: WFL for tasks assessed/performed Overall Cognitive Status: Within Functional Limits for tasks assessed  Exercises      General Comments        Pertinent Vitals/Pain Pain Assessment: 0-10 Pain Score: 6  Pain Location: hip L and abdomen Pain Descriptors / Indicators: Tender;Discomfort;Tightness Pain Intervention(s): Monitored during session;Repositioned    Home Living                      Prior Function            PT Goals (current goals can now be found in the care plan section) Acute Rehab PT Goals Patient Stated Goal: going to have another surgery Potential to Achieve Goals: Fair Progress towards PT goals: Progressing toward goals    Frequency     Min 5X/week      PT Plan Current plan remains appropriate    Co-evaluation              AM-PAC PT "6 Clicks" Mobility   Outcome Measure  Help needed turning from your back to your side while in a flat bed without using bedrails?: A Little   Help needed moving to and from a bed to a chair (including a wheelchair)?: A Little Help needed standing up from a chair using your arms (e.g., wheelchair or bedside chair)?: A Little Help needed to walk in hospital room?: A Little Help needed climbing 3-5 steps with a railing? : A Little 6 Click Score: 15    End of Session Equipment Utilized During Treatment: Gait belt Activity Tolerance: Patient limited by pain Patient left: with call bell/phone within reach;in bed;with bed alarm set Nurse Communication: Mobility status;Precautions;Weight bearing status PT Visit Diagnosis: Unsteadiness on feet (R26.81);Other abnormalities of gait and mobility (R26.89);Muscle weakness (generalized) (M62.81);Difficulty in walking, not elsewhere classified (R26.2);Pain Pain - Right/Left: Left Pain - part of body: Hip     Time: 1153-1207 PT Time Calculation (min) (ACUTE ONLY): 14 min  Charges:  $Gait Training: 8-22 mins                     Governor Rooks, PTA Acute Rehabilitation Services Pager [phone] Office [phone]     [name] [name] 09/01/2018, 12:12 PM

## 2018-09-01 NOTE — Progress Notes (Signed)
Patient ID: [name], male   DOB: 11/01/1950, 68 y.o.   MRN: [id number] Stable overall.  Can be discharged to skilled nursing today.

## 2018-09-01 NOTE — Progress Notes (Signed)
Patient transported via [place]. Patient's personal belongings and AVS sent with patient.

## 2018-09-01 NOTE — Progress Notes (Signed)
Report called to Quillian Quince, Therapist, sports at [facility]. Patient awaiting transfer scheduled for 1300.

## 2018-09-02 ENCOUNTER — Encounter: Payer: Self-pay | Admitting: Internal Medicine

## 2018-09-02 ENCOUNTER — Other Ambulatory Visit: Payer: Self-pay | Admitting: Internal Medicine

## 2018-09-02 ENCOUNTER — Telehealth (INDEPENDENT_AMBULATORY_CARE_PROVIDER_SITE_OTHER): Payer: Self-pay | Admitting: *Deleted

## 2018-09-02 ENCOUNTER — Telehealth: Payer: Self-pay | Admitting: *Deleted

## 2018-09-02 DIAGNOSIS — B20 Human immunodeficiency virus [HIV] disease: Secondary | ICD-10-CM | POA: Diagnosis not present

## 2018-09-02 DIAGNOSIS — T84029A Dislocation of unspecified internal joint prosthesis, initial encounter: Secondary | ICD-10-CM | POA: Diagnosis not present

## 2018-09-02 DIAGNOSIS — S71102A Unspecified open wound, left thigh, initial encounter: Secondary | ICD-10-CM | POA: Diagnosis not present

## 2018-09-02 DIAGNOSIS — F418 Other specified anxiety disorders: Secondary | ICD-10-CM

## 2018-09-02 DIAGNOSIS — F39 Unspecified mood [affective] disorder: Secondary | ICD-10-CM | POA: Diagnosis not present

## 2018-09-02 DIAGNOSIS — I2510 Atherosclerotic heart disease of native coronary artery without angina pectoris: Secondary | ICD-10-CM | POA: Diagnosis not present

## 2018-09-02 DIAGNOSIS — M87052 Idiopathic aseptic necrosis of left femur: Secondary | ICD-10-CM | POA: Diagnosis not present

## 2018-09-02 DIAGNOSIS — Z96649 Presence of unspecified artificial hip joint: Secondary | ICD-10-CM | POA: Diagnosis not present

## 2018-09-02 MED ORDER — REXULTI 0.25 MG PO TABS: 0.2500 mg | ORAL_TABLET | Freq: Every day | ORAL | 1 refills | Status: DC

## 2018-09-02 NOTE — Telephone Encounter (Signed)
[facility] phone call to patient. See care plan note and flow sheet for details.

## 2018-09-02 NOTE — Care Plan (Signed)
RNCM left VM for patient informing that a 1 week follow up appointment with Dr. Ninfa Linden has been scheduled for 09/08/18 at 9:30 am. [facility] also contacted [facility] and his daughter to inform of this scheduled appointment. Reminded [facility] staff that patient is an Ortho bundle patient and to contact RNCM with any needs related to follow up or discharge from their facility. Staff made note of this and will provide information to SW staff. Patient's daughter indicated she would be present for follow up visit next week with Dr. Ninfa Linden as well.

## 2018-09-02 NOTE — Telephone Encounter (Signed)
Pt was on TCM report admitted 08/28/18 for operative treatment recurrent dislocation of hip joint prosthesis. Pt underwent a underwent  Procedure(s): CLOSED REDUCTION HIP FOR RECURRENT DISLOCATION AND DRESSING CHANGE. Pt D/c 09/01/18 to SNF, but will follow-up w/surgeon Dr. Rush Farmer once he has been discharge from Skilled Nursing.Marland KitchenJohny [name]

## 2018-09-06 DIAGNOSIS — K5901 Slow transit constipation: Secondary | ICD-10-CM | POA: Diagnosis not present

## 2018-09-06 DIAGNOSIS — S71102A Unspecified open wound, left thigh, initial encounter: Secondary | ICD-10-CM | POA: Diagnosis not present

## 2018-09-07 DIAGNOSIS — K5901 Slow transit constipation: Secondary | ICD-10-CM | POA: Diagnosis not present

## 2018-09-07 DIAGNOSIS — S71102A Unspecified open wound, left thigh, initial encounter: Secondary | ICD-10-CM | POA: Diagnosis not present

## 2018-09-07 DIAGNOSIS — Z96649 Presence of unspecified artificial hip joint: Secondary | ICD-10-CM | POA: Diagnosis not present

## 2018-09-08 ENCOUNTER — Inpatient Hospital Stay (INDEPENDENT_AMBULATORY_CARE_PROVIDER_SITE_OTHER): Payer: Medicare Other | Admitting: Orthopaedic Surgery

## 2018-09-09 ENCOUNTER — Telehealth (INDEPENDENT_AMBULATORY_CARE_PROVIDER_SITE_OTHER): Payer: Self-pay

## 2018-09-09 ENCOUNTER — Other Ambulatory Visit: Payer: Self-pay | Admitting: *Deleted

## 2018-09-09 ENCOUNTER — Encounter (INDEPENDENT_AMBULATORY_CARE_PROVIDER_SITE_OTHER): Payer: Self-pay | Admitting: Orthopaedic Surgery

## 2018-09-09 ENCOUNTER — Ambulatory Visit (INDEPENDENT_AMBULATORY_CARE_PROVIDER_SITE_OTHER): Payer: No Typology Code available for payment source

## 2018-09-09 ENCOUNTER — Other Ambulatory Visit: Payer: Self-pay

## 2018-09-09 ENCOUNTER — Ambulatory Visit (INDEPENDENT_AMBULATORY_CARE_PROVIDER_SITE_OTHER): Payer: No Typology Code available for payment source | Admitting: Orthopaedic Surgery

## 2018-09-09 DIAGNOSIS — Z96642 Presence of left artificial hip joint: Secondary | ICD-10-CM

## 2018-09-09 DIAGNOSIS — B20 Human immunodeficiency virus [HIV] disease: Secondary | ICD-10-CM | POA: Diagnosis not present

## 2018-09-09 DIAGNOSIS — Z87828 Personal history of other (healed) physical injury and trauma: Secondary | ICD-10-CM

## 2018-09-09 DIAGNOSIS — Z96649 Presence of unspecified artificial hip joint: Secondary | ICD-10-CM | POA: Diagnosis not present

## 2018-09-09 DIAGNOSIS — Z8739 Personal history of other diseases of the musculoskeletal system and connective tissue: Secondary | ICD-10-CM | POA: Diagnosis not present

## 2018-09-09 NOTE — Telephone Encounter (Signed)
Kim from [facility] called stating she needs clarification on PT orders. Please call to discuss [phone]

## 2018-09-09 NOTE — Patient Outreach (Signed)
[facility]) Care Management  09/09/2018  [name] May 07, 1951 [id number]    Member discussed in telephonic IDT meeting with facility and [facility] UM team. [name] is currently at [facility] for rehab therapy.  Facility reports member has ortho follow up  appointment today 09/09/18.   Facility reports member is doing well with therapy but needs quite a bit of assistance with getting out of bed. He requires dressing changes to left hip.  Facility reports that member's daughter is working on getting a ramp for [name]. Staff reports member has 4 steps to enter his home.  Member is being assessed for potential [facility] Care Management program needs as a benefit of his [employer].   Will plan to outreach to member to discuss [place] Management program services.   Per chart, [name] has a medical history of HTN, GERD, MI, CVA, CAD, TIA. Recent hospitalization for closed reduction for recurrent dislocation and dressing change of left hip.   Will continue to collaborate with [facility] UM RN and facility staff on member.   Marthenia Rolling, MSN-Ed, RN,BSN [facility] Liaison [phone]

## 2018-09-09 NOTE — Progress Notes (Signed)
The patient is a very pleasant 68 year old gentleman that I am seeing back in follow-up after having a left total hip arthroplasty and then a revision of that arthroplasty due to an acute dislocation.  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  He has a significant kyphosis to his posture and gait.  When he lay supine that resolves but sitting down he sits in such hunched over position that his pelvis aligns a much more posterior.  From a surgical standpoint, we took as much measurements preoperative as we could to make sure that we anteverted his implants in appropriate position knowing that there is a small window of placing these components to maximize the positioning with minimizing the stress on the hip.  After his first dislocation when he actually dislocated himself with extreme of hip flexion internal rotation try to get a shoe on I take him directly back to the operating room and changed out all components to a high offset stem, lengthening him with a longer hip ball, and anteverting the hip acetabular component more so.  He is also in a hip abduction brace.  He had an additional dislocation prior to [place] putting in this brace.  On exam today the brace is fitting appropriately.  I removed all his sutures in place Steri-Strips.  There is a large seroma that we drained from the area and then placed dressing.  He will continue the hip abduction brace for a period of 6 weeks.  He will adhere to strict posterior hip precautions.  He has his daughter with him today and they understand that if he does dislocate again that I would absolutely need to revise the acetabular component or even a larger cup with even more anteversion and flexion.  We will see him back in 1 week for just an incision and wound check but no x-rays are needed.  All question concerns were answered and addressed.

## 2018-09-10 ENCOUNTER — Telehealth (INDEPENDENT_AMBULATORY_CARE_PROVIDER_SITE_OTHER): Payer: Self-pay | Admitting: *Deleted

## 2018-09-10 NOTE — Telephone Encounter (Signed)
Please advise 

## 2018-09-10 NOTE — Care Plan (Signed)
RNCM received call over the weekend from patient's daughter stating that he had dislocated his Left hip again and will be admitted to the hospital. Spoke with her on Monday, 08/30/18 and discussed reduction surgery for dislocation. Will continue to monitor. Made aware that RNCM was not able to reach patient in his hospital room as it rang and rang. Also RNCM is unable to see him in the hospital due to restrictions related to Covid-19. Will continue to monitor for case management needs.

## 2018-09-10 NOTE — Care Plan (Signed)
RNCM met with patient and his daughter while in office for a 1 week post discharge follow up with Dr. Ninfa Linden. This is the patient's first office visit since having surgery.  Patient's original surgery date was 08/13/18 for a Left-total hip replacement. Delayed discharge from hospital originally due to dislocation on 08/15/18 (day of discharge) and repeat reduction then revision on 08/17/18. Discharge date of 08/20/18 to home with HHPT. Another dislocation of the hip occurred at home on 08/28/18 with readmission to the hospital, surgical reduction and hospital stay until 09/01/18. Currently residing in [place] for skilled nursing and rehab. Wearing hip stabilizing brace and strict posterior hip precautions. MD drained a seroma at the incision site today in office. Continue with PT at facility. Will follow up in 1 week on 09/16/18. MD anticipated wearing of the hip brace for at least 6 weeks. If hip were to dislocate again, discussed surgical options with patient and his daughter. RNCM spoke with patient's daughter later in the day and requested if MD wanted to continue with antibiotic as discussed. Verified that patient should continue with Bactrim that he is already currently taking. RNCM also spoke with facility PT, Maudie Mercury, and she requested clarification of PT orders provided by MD today. Reached out to MD, who contacted PT directly to discuss and clarify orders and goals. Will continue to follow for Ortho Bundle case management.

## 2018-09-10 NOTE — Telephone Encounter (Signed)
Ortho Bundle CM documentation completed.

## 2018-09-13 DIAGNOSIS — J8489 Other specified interstitial pulmonary diseases: Secondary | ICD-10-CM | POA: Diagnosis not present

## 2018-09-13 DIAGNOSIS — Z96649 Presence of unspecified artificial hip joint: Secondary | ICD-10-CM | POA: Diagnosis not present

## 2018-09-14 DIAGNOSIS — R0602 Shortness of breath: Secondary | ICD-10-CM | POA: Diagnosis not present

## 2018-09-14 DIAGNOSIS — I82409 Acute embolism and thrombosis of unspecified deep veins of unspecified lower extremity: Secondary | ICD-10-CM | POA: Diagnosis not present

## 2018-09-15 ENCOUNTER — Inpatient Hospital Stay (HOSPITAL_COMMUNITY): Payer: Medicare Other

## 2018-09-15 ENCOUNTER — Encounter (HOSPITAL_COMMUNITY): Payer: Self-pay | Admitting: Emergency Medicine

## 2018-09-15 ENCOUNTER — Encounter (HOSPITAL_COMMUNITY)
Admission: EM | Disposition: A | Discharge status: Skilled Nursing Facility | Payer: Self-pay | Source: Home / Self Care | Attending: Orthopaedic Surgery

## 2018-09-15 ENCOUNTER — Inpatient Hospital Stay (HOSPITAL_COMMUNITY): Payer: Medicare Other | Admitting: Anesthesiology

## 2018-09-15 ENCOUNTER — Inpatient Hospital Stay (HOSPITAL_COMMUNITY)
Admission: EM | Admit: 2018-09-15 | Discharge: 2018-09-22 | DRG: 467 | Disposition: A | Discharge status: Skilled Nursing Facility | Payer: Medicare Other | Attending: Orthopaedic Surgery | Admitting: Orthopaedic Surgery

## 2018-09-15 ENCOUNTER — Other Ambulatory Visit: Payer: Self-pay

## 2018-09-15 ENCOUNTER — Emergency Department (HOSPITAL_COMMUNITY): Payer: Medicare Other

## 2018-09-15 ENCOUNTER — Inpatient Hospital Stay: Payer: Self-pay | Admitting: Orthopaedic Surgery

## 2018-09-15 DIAGNOSIS — E876 Hypokalemia: Secondary | ICD-10-CM | POA: Diagnosis not present

## 2018-09-15 DIAGNOSIS — I10 Essential (primary) hypertension: Secondary | ICD-10-CM | POA: Diagnosis present

## 2018-09-15 DIAGNOSIS — Z885 Allergy status to narcotic agent status: Secondary | ICD-10-CM

## 2018-09-15 DIAGNOSIS — Z8673 Personal history of transient ischemic attack (TIA), and cerebral infarction without residual deficits: Secondary | ICD-10-CM

## 2018-09-15 DIAGNOSIS — Z1159 Encounter for screening for other viral diseases: Secondary | ICD-10-CM | POA: Diagnosis not present

## 2018-09-15 DIAGNOSIS — I2510 Atherosclerotic heart disease of native coronary artery without angina pectoris: Secondary | ICD-10-CM | POA: Diagnosis present

## 2018-09-15 DIAGNOSIS — A519 Early syphilis, unspecified: Secondary | ICD-10-CM | POA: Diagnosis not present

## 2018-09-15 DIAGNOSIS — K219 Gastro-esophageal reflux disease without esophagitis: Secondary | ICD-10-CM | POA: Diagnosis present

## 2018-09-15 DIAGNOSIS — D689 Coagulation defect, unspecified: Secondary | ICD-10-CM | POA: Diagnosis not present

## 2018-09-15 DIAGNOSIS — B20 Human immunodeficiency virus [HIV] disease: Secondary | ICD-10-CM | POA: Diagnosis not present

## 2018-09-15 DIAGNOSIS — M810 Age-related osteoporosis without current pathological fracture: Secondary | ICD-10-CM | POA: Diagnosis present

## 2018-09-15 DIAGNOSIS — M24452 Recurrent dislocation, left hip: Secondary | ICD-10-CM | POA: Diagnosis present

## 2018-09-15 DIAGNOSIS — T84020D Dislocation of internal right hip prosthesis, subsequent encounter: Secondary | ICD-10-CM | POA: Diagnosis not present

## 2018-09-15 DIAGNOSIS — M069 Rheumatoid arthritis, unspecified: Secondary | ICD-10-CM | POA: Diagnosis present

## 2018-09-15 DIAGNOSIS — Z7401 Bed confinement status: Secondary | ICD-10-CM | POA: Diagnosis not present

## 2018-09-15 DIAGNOSIS — Z96649 Presence of unspecified artificial hip joint: Secondary | ICD-10-CM

## 2018-09-15 DIAGNOSIS — R2689 Other abnormalities of gait and mobility: Secondary | ICD-10-CM | POA: Diagnosis not present

## 2018-09-15 DIAGNOSIS — T84021A Dislocation of internal left hip prosthesis, initial encounter: Principal | ICD-10-CM | POA: Diagnosis present

## 2018-09-15 DIAGNOSIS — T07XXXA Unspecified multiple injuries, initial encounter: Secondary | ICD-10-CM | POA: Diagnosis not present

## 2018-09-15 DIAGNOSIS — Z981 Arthrodesis status: Secondary | ICD-10-CM | POA: Diagnosis not present

## 2018-09-15 DIAGNOSIS — M40209 Unspecified kyphosis, site unspecified: Secondary | ICD-10-CM | POA: Diagnosis present

## 2018-09-15 DIAGNOSIS — F329 Major depressive disorder, single episode, unspecified: Secondary | ICD-10-CM | POA: Diagnosis not present

## 2018-09-15 DIAGNOSIS — Z96642 Presence of left artificial hip joint: Secondary | ICD-10-CM

## 2018-09-15 DIAGNOSIS — N529 Male erectile dysfunction, unspecified: Secondary | ICD-10-CM | POA: Diagnosis present

## 2018-09-15 DIAGNOSIS — Z03818 Encounter for observation for suspected exposure to other biological agents ruled out: Secondary | ICD-10-CM | POA: Diagnosis not present

## 2018-09-15 DIAGNOSIS — M879 Osteonecrosis, unspecified: Secondary | ICD-10-CM | POA: Diagnosis present

## 2018-09-15 DIAGNOSIS — D62 Acute posthemorrhagic anemia: Secondary | ICD-10-CM | POA: Diagnosis not present

## 2018-09-15 DIAGNOSIS — M2550 Pain in unspecified joint: Secondary | ICD-10-CM | POA: Diagnosis not present

## 2018-09-15 DIAGNOSIS — Z21 Asymptomatic human immunodeficiency virus [HIV] infection status: Secondary | ICD-10-CM | POA: Diagnosis present

## 2018-09-15 DIAGNOSIS — Z91018 Allergy to other foods: Secondary | ICD-10-CM

## 2018-09-15 DIAGNOSIS — T84029A Dislocation of unspecified internal joint prosthesis, initial encounter: Secondary | ICD-10-CM

## 2018-09-15 DIAGNOSIS — Z419 Encounter for procedure for purposes other than remedying health state, unspecified: Secondary | ICD-10-CM

## 2018-09-15 DIAGNOSIS — F418 Other specified anxiety disorders: Secondary | ICD-10-CM | POA: Diagnosis not present

## 2018-09-15 DIAGNOSIS — T8149XA Infection following a procedure, other surgical site, initial encounter: Secondary | ICD-10-CM | POA: Diagnosis not present

## 2018-09-15 DIAGNOSIS — F419 Anxiety disorder, unspecified: Secondary | ICD-10-CM | POA: Diagnosis not present

## 2018-09-15 DIAGNOSIS — D539 Nutritional anemia, unspecified: Secondary | ICD-10-CM | POA: Diagnosis not present

## 2018-09-15 DIAGNOSIS — Z471 Aftercare following joint replacement surgery: Secondary | ICD-10-CM | POA: Diagnosis not present

## 2018-09-15 DIAGNOSIS — G629 Polyneuropathy, unspecified: Secondary | ICD-10-CM | POA: Diagnosis present

## 2018-09-15 DIAGNOSIS — Z8249 Family history of ischemic heart disease and other diseases of the circulatory system: Secondary | ICD-10-CM | POA: Diagnosis not present

## 2018-09-15 DIAGNOSIS — Z823 Family history of stroke: Secondary | ICD-10-CM | POA: Diagnosis not present

## 2018-09-15 DIAGNOSIS — Z888 Allergy status to other drugs, medicaments and biological substances status: Secondary | ICD-10-CM

## 2018-09-15 DIAGNOSIS — I252 Old myocardial infarction: Secondary | ICD-10-CM | POA: Diagnosis not present

## 2018-09-15 DIAGNOSIS — I659 Occlusion and stenosis of unspecified precerebral artery: Secondary | ICD-10-CM | POA: Diagnosis not present

## 2018-09-15 DIAGNOSIS — Z951 Presence of aortocoronary bypass graft: Secondary | ICD-10-CM

## 2018-09-15 DIAGNOSIS — R52 Pain, unspecified: Secondary | ICD-10-CM | POA: Diagnosis not present

## 2018-09-15 DIAGNOSIS — J302 Other seasonal allergic rhinitis: Secondary | ICD-10-CM | POA: Diagnosis present

## 2018-09-15 DIAGNOSIS — Z880 Allergy status to penicillin: Secondary | ICD-10-CM | POA: Diagnosis not present

## 2018-09-15 DIAGNOSIS — R278 Other lack of coordination: Secondary | ICD-10-CM | POA: Diagnosis not present

## 2018-09-15 DIAGNOSIS — T84020S Dislocation of internal right hip prosthesis, sequela: Secondary | ICD-10-CM | POA: Diagnosis not present

## 2018-09-15 DIAGNOSIS — Z856 Personal history of leukemia: Secondary | ICD-10-CM | POA: Diagnosis not present

## 2018-09-15 DIAGNOSIS — E785 Hyperlipidemia, unspecified: Secondary | ICD-10-CM | POA: Diagnosis not present

## 2018-09-15 DIAGNOSIS — M25552 Pain in left hip: Secondary | ICD-10-CM | POA: Diagnosis not present

## 2018-09-15 DIAGNOSIS — S73005A Unspecified dislocation of left hip, initial encounter: Secondary | ICD-10-CM | POA: Diagnosis present

## 2018-09-15 DIAGNOSIS — Y792 Prosthetic and other implants, materials and accessory orthopedic devices associated with adverse incidents: Secondary | ICD-10-CM | POA: Diagnosis present

## 2018-09-15 DIAGNOSIS — I82509 Chronic embolism and thrombosis of unspecified deep veins of unspecified lower extremity: Secondary | ICD-10-CM | POA: Diagnosis not present

## 2018-09-15 DIAGNOSIS — T84021S Dislocation of internal left hip prosthesis, sequela: Secondary | ICD-10-CM | POA: Diagnosis not present

## 2018-09-15 DIAGNOSIS — R4181 Age-related cognitive decline: Secondary | ICD-10-CM | POA: Diagnosis not present

## 2018-09-15 DIAGNOSIS — Z7952 Long term (current) use of systemic steroids: Secondary | ICD-10-CM

## 2018-09-15 DIAGNOSIS — Z79899 Other long term (current) drug therapy: Secondary | ICD-10-CM

## 2018-09-15 DIAGNOSIS — C9111 Chronic lymphocytic leukemia of B-cell type in remission: Secondary | ICD-10-CM | POA: Diagnosis not present

## 2018-09-15 DIAGNOSIS — R5381 Other malaise: Secondary | ICD-10-CM | POA: Diagnosis not present

## 2018-09-15 DIAGNOSIS — M797 Fibromyalgia: Secondary | ICD-10-CM | POA: Diagnosis not present

## 2018-09-15 DIAGNOSIS — Z91010 Allergy to peanuts: Secondary | ICD-10-CM

## 2018-09-15 DIAGNOSIS — M6281 Muscle weakness (generalized): Secondary | ICD-10-CM | POA: Diagnosis not present

## 2018-09-15 DIAGNOSIS — B159 Hepatitis A without hepatic coma: Secondary | ICD-10-CM | POA: Diagnosis not present

## 2018-09-15 HISTORY — PX: HIP CLOSED REDUCTION: SHX983

## 2018-09-15 LAB — CBC WITH DIFFERENTIAL/PLATELET
Abs Immature Granulocytes: 0.04 K/uL (ref 0.00–0.07)
Basophils Absolute: 0 K/uL (ref 0.0–0.1)
Basophils Relative: 0 %
Eosinophils Absolute: 0 K/uL (ref 0.0–0.5)
Eosinophils Relative: 0 %
HCT: 34.7 % — ABNORMAL LOW (ref 39.0–52.0)
Hemoglobin: 10.7 g/dL — ABNORMAL LOW (ref 13.0–17.0)
Immature Granulocytes: 0 %
Lymphocytes Relative: 68 %
Lymphs Abs: 6.2 K/uL — ABNORMAL HIGH (ref 0.7–4.0)
MCH: 31.8 pg (ref 26.0–34.0)
MCHC: 30.8 g/dL (ref 30.0–36.0)
MCV: 103.3 fL — ABNORMAL HIGH (ref 80.0–100.0)
Monocytes Absolute: 1.1 K/uL — ABNORMAL HIGH (ref 0.1–1.0)
Monocytes Relative: 12 %
Neutro Abs: 1.8 K/uL (ref 1.7–7.7)
Neutrophils Relative %: 20 %
Platelets: 149 K/uL — ABNORMAL LOW (ref 150–400)
RBC: 3.36 MIL/uL — ABNORMAL LOW (ref 4.22–5.81)
RDW: 16.4 % — ABNORMAL HIGH (ref 11.5–15.5)
WBC: 9.1 K/uL (ref 4.0–10.5)
nRBC: 0 % (ref 0.0–0.2)

## 2018-09-15 LAB — BASIC METABOLIC PANEL
Anion gap: 10 (ref 5–15)
BUN: 11 mg/dL (ref 8–23)
CO2: 27 mmol/L (ref 22–32)
Calcium: 8 mg/dL — ABNORMAL LOW (ref 8.9–10.3)
Chloride: 98 mmol/L (ref 98–111)
Creatinine, Ser: 0.68 mg/dL (ref 0.61–1.24)
GFR calc Af Amer: >60 mL/min (ref >60)
GFR calc non Af Amer: >60 mL/min (ref >60)
Glucose, Bld: 115 mg/dL — ABNORMAL HIGH (ref 70–99)
Potassium: 3.3 mmol/L — ABNORMAL LOW (ref 3.5–5.1)
Sodium: 135 mmol/L (ref 135–145)

## 2018-09-15 LAB — GLUCOSE, CAPILLARY: Glucose-Capillary: 83 mg/dL (ref 70–99)

## 2018-09-15 LAB — SURGICAL PCR SCREEN
MRSA, PCR: NEGATIVE
Staphylococcus aureus: NEGATIVE

## 2018-09-15 LAB — SARS CORONAVIRUS 2 BY RT PCR (HOSPITAL ORDER, PERFORMED IN ~~LOC~~ HOSPITAL LAB): SARS Coronavirus 2: NEGATIVE

## 2018-09-15 SURGERY — CLOSED REDUCTION, HIP
Anesthesia: General | Site: Hip | Laterality: Left

## 2018-09-15 MED ORDER — PREDNISONE 5 MG PO TABS
5.0000 mg | ORAL_TABLET | Freq: Two times a day (BID) | ORAL | Status: DC
  Administered 2018-09-15 – 2018-09-22 (×14): 5 mg via ORAL
  Filled 2018-09-15 (×14): qty 1

## 2018-09-15 MED ORDER — DARUNAVIR ETHANOLATE 800 MG PO TABS
800.0000 mg | ORAL_TABLET | Freq: Every day | ORAL | Status: DC
  Administered 2018-09-15 – 2018-09-22 (×8): 800 mg via ORAL
  Filled 2018-09-15 (×9): qty 1

## 2018-09-15 MED ORDER — LACTATED RINGERS IV SOLN
INTRAVENOUS | Status: DC
  Administered 2018-09-15 – 2018-09-17 (×3): via INTRAVENOUS

## 2018-09-15 MED ORDER — FENTANYL CITRATE (PF) 250 MCG/5ML IJ SOLN
INTRAMUSCULAR | Status: AC
  Filled 2018-09-15: qty 5

## 2018-09-15 MED ORDER — MUPIROCIN 2 % EX OINT: 1.0000 "application " | TOPICAL_OINTMENT | Freq: Two times a day (BID) | CUTANEOUS | Status: DC

## 2018-09-15 MED ORDER — FENTANYL CITRATE (PF) 250 MCG/5ML IJ SOLN
INTRAMUSCULAR | Status: DC | PRN
  Administered 2018-09-15 (×2): 50 ug via INTRAVENOUS

## 2018-09-15 MED ORDER — ESMOLOL HCL 100 MG/10ML IV SOLN
INTRAVENOUS | Status: DC | PRN
  Administered 2018-09-15: 20 mg via INTRAVENOUS
  Administered 2018-09-15: 30 mg via INTRAVENOUS

## 2018-09-15 MED ORDER — METHOCARBAMOL 500 MG PO TABS
500.0000 mg | ORAL_TABLET | Freq: Four times a day (QID) | ORAL | Status: DC | PRN
  Administered 2018-09-15: 500 mg via ORAL
  Filled 2018-09-15: qty 1

## 2018-09-15 MED ORDER — ESMOLOL HCL 100 MG/10ML IV SOLN
INTRAVENOUS | Status: AC
  Filled 2018-09-15: qty 10

## 2018-09-15 MED ORDER — ESCITALOPRAM OXALATE 20 MG PO TABS
20.0000 mg | ORAL_TABLET | Freq: Every day | ORAL | Status: DC
  Administered 2018-09-15 – 2018-09-22 (×8): 20 mg via ORAL
  Filled 2018-09-15 (×8): qty 1

## 2018-09-15 MED ORDER — PROPOFOL 10 MG/ML IV BOLUS
INTRAVENOUS | Status: AC
  Filled 2018-09-15: qty 20

## 2018-09-15 MED ORDER — PHENYLEPHRINE 40 MCG/ML (10ML) SYRINGE FOR IV PUSH (FOR BLOOD PRESSURE SUPPORT)
PREFILLED_SYRINGE | INTRAVENOUS | Status: DC | PRN
  Administered 2018-09-15 (×3): 120 ug via INTRAVENOUS
  Administered 2018-09-15: 40 ug via INTRAVENOUS

## 2018-09-15 MED ORDER — HYDROMORPHONE HCL 1 MG/ML IJ SOLN
0.5000 mg | Freq: Once | INTRAMUSCULAR | Status: AC
  Administered 2018-09-15: 0.5 mg via INTRAVENOUS
  Filled 2018-09-15: qty 1

## 2018-09-15 MED ORDER — PREGABALIN 100 MG PO CAPS
200.0000 mg | ORAL_CAPSULE | Freq: Two times a day (BID) | ORAL | Status: DC
  Administered 2018-09-15 – 2018-09-22 (×14): 200 mg via ORAL
  Filled 2018-09-15 (×14): qty 2

## 2018-09-15 MED ORDER — DEXAMETHASONE SODIUM PHOSPHATE 10 MG/ML IJ SOLN
INTRAMUSCULAR | Status: AC
  Filled 2018-09-15: qty 1

## 2018-09-15 MED ORDER — ONDANSETRON HCL 4 MG/2ML IJ SOLN
INTRAMUSCULAR | Status: AC
  Filled 2018-09-15: qty 2

## 2018-09-15 MED ORDER — SENNA 8.6 MG PO TABS
1.0000 | ORAL_TABLET | Freq: Every day | ORAL | Status: DC
  Administered 2018-09-15 – 2018-09-22 (×6): 8.6 mg via ORAL
  Filled 2018-09-15 (×6): qty 1

## 2018-09-15 MED ORDER — VALACYCLOVIR HCL 500 MG PO TABS
500.0000 mg | ORAL_TABLET | Freq: Every day | ORAL | Status: DC
  Administered 2018-09-15 – 2018-09-22 (×8): 500 mg via ORAL
  Filled 2018-09-15 (×9): qty 1

## 2018-09-15 MED ORDER — FENTANYL CITRATE (PF) 100 MCG/2ML IJ SOLN: 25.0000 ug | INTRAMUSCULAR | Status: DC | PRN

## 2018-09-15 MED ORDER — HYDROMORPHONE HCL 2 MG PO TABS
2.0000 mg | ORAL_TABLET | ORAL | Status: DC | PRN
  Administered 2018-09-16 – 2018-09-21 (×6): 2 mg via ORAL
  Filled 2018-09-15 (×7): qty 1

## 2018-09-15 MED ORDER — ONDANSETRON HCL 4 MG/2ML IJ SOLN: 4.0000 mg | Freq: Four times a day (QID) | INTRAMUSCULAR | Status: DC | PRN

## 2018-09-15 MED ORDER — FAMOTIDINE 20 MG PO TABS
40.0000 mg | ORAL_TABLET | Freq: Two times a day (BID) | ORAL | Status: DC
  Administered 2018-09-15 – 2018-09-22 (×14): 40 mg via ORAL
  Filled 2018-09-15 (×14): qty 2

## 2018-09-15 MED ORDER — PHENYLEPHRINE 40 MCG/ML (10ML) SYRINGE FOR IV PUSH (FOR BLOOD PRESSURE SUPPORT)
PREFILLED_SYRINGE | INTRAVENOUS | Status: AC
  Filled 2018-09-15: qty 10

## 2018-09-15 MED ORDER — SUCCINYLCHOLINE CHLORIDE 200 MG/10ML IV SOSY
PREFILLED_SYRINGE | INTRAVENOUS | Status: AC
  Filled 2018-09-15: qty 10

## 2018-09-15 MED ORDER — POLYETHYLENE GLYCOL 3350 17 G PO PACK
17.0000 g | PACK | Freq: Every day | ORAL | Status: DC
  Administered 2018-09-15 – 2018-09-21 (×5): 17 g via ORAL
  Filled 2018-09-15 (×6): qty 1

## 2018-09-15 MED ORDER — SODIUM CHLORIDE 0.9 % IV SOLN
INTRAVENOUS | Status: DC
  Administered 2018-09-15 – 2018-09-18 (×2): via INTRAVENOUS

## 2018-09-15 MED ORDER — PROPOFOL 10 MG/ML IV BOLUS
INTRAVENOUS | Status: DC | PRN
  Administered 2018-09-15: 130 mg via INTRAVENOUS

## 2018-09-15 MED ORDER — SUCCINYLCHOLINE CHLORIDE 200 MG/10ML IV SOSY
PREFILLED_SYRINGE | INTRAVENOUS | Status: DC | PRN
  Administered 2018-09-15: 100 mg via INTRAVENOUS

## 2018-09-15 MED ORDER — HYDROMORPHONE HCL 1 MG/ML IJ SOLN
1.0000 mg | INTRAMUSCULAR | Status: DC | PRN
  Administered 2018-09-15 – 2018-09-16 (×2): 1 mg via INTRAVENOUS
  Filled 2018-09-15 (×2): qty 1

## 2018-09-15 MED ORDER — LACTATED RINGERS IV SOLN
INTRAVENOUS | Status: DC | PRN
  Administered 2018-09-15: 13:00:00 via INTRAVENOUS

## 2018-09-15 MED ORDER — LIDOCAINE 2% (20 MG/ML) 5 ML SYRINGE
INTRAMUSCULAR | Status: AC
  Filled 2018-09-15: qty 5

## 2018-09-15 MED ORDER — ELVITEG-COBIC-EMTRICIT-TENOFAF 150-150-200-10 MG PO TABS
1.0000 | ORAL_TABLET | Freq: Every day | ORAL | Status: DC
  Administered 2018-09-15 – 2018-09-22 (×8): 1 via ORAL
  Filled 2018-09-15 (×9): qty 1

## 2018-09-15 MED ORDER — BREXPIPRAZOLE 0.25 MG HALF TABLET
0.2500 mg | ORAL_TABLET | Freq: Every day | ORAL | Status: DC
  Administered 2018-09-15 – 2018-09-22 (×8): 0.25 mg via ORAL
  Filled 2018-09-15 (×9): qty 1

## 2018-09-15 MED ORDER — LIDOCAINE 2% (20 MG/ML) 5 ML SYRINGE
INTRAMUSCULAR | Status: DC | PRN
  Administered 2018-09-15: 60 mg via INTRAVENOUS

## 2018-09-15 MED ORDER — NITROGLYCERIN 0.4 MG SL SUBL: 0.4000 mg | SUBLINGUAL_TABLET | SUBLINGUAL | Status: DC | PRN

## 2018-09-15 MED ORDER — FOLIC ACID 1 MG PO TABS
1.0000 mg | ORAL_TABLET | Freq: Every day | ORAL | Status: DC
  Administered 2018-09-15 – 2018-09-22 (×8): 1 mg via ORAL
  Filled 2018-09-15 (×8): qty 1

## 2018-09-15 MED ORDER — PROPOFOL 10 MG/ML IV BOLUS
0.5000 mg/kg | Freq: Once | INTRAVENOUS | Status: AC
  Administered 2018-09-15: 43.1 mg via INTRAVENOUS
  Filled 2018-09-15: qty 20

## 2018-09-15 MED ORDER — ATORVASTATIN CALCIUM 10 MG PO TABS
10.0000 mg | ORAL_TABLET | Freq: Every day | ORAL | Status: DC
  Administered 2018-09-15 – 2018-09-21 (×7): 10 mg via ORAL
  Filled 2018-09-15 (×7): qty 1

## 2018-09-15 MED ORDER — ONDANSETRON HCL 4 MG/2ML IJ SOLN
INTRAMUSCULAR | Status: DC | PRN
  Administered 2018-09-15: 4 mg via INTRAVENOUS

## 2018-09-15 MED ORDER — FUROSEMIDE 20 MG PO TABS
20.0000 mg | ORAL_TABLET | Freq: Two times a day (BID) | ORAL | Status: DC
  Administered 2018-09-15 – 2018-09-22 (×14): 20 mg via ORAL
  Filled 2018-09-15 (×14): qty 1

## 2018-09-15 MED ORDER — LEFLUNOMIDE 20 MG PO TABS
20.0000 mg | ORAL_TABLET | Freq: Every day | ORAL | Status: DC
  Administered 2018-09-15 – 2018-09-22 (×8): 20 mg via ORAL
  Filled 2018-09-15 (×9): qty 1

## 2018-09-15 SURGICAL SUPPLY — 3 items
DRSG AQUACEL AG ADV 3.5X10 (GAUZE/BANDAGES/DRESSINGS) ×2 IMPLANT
DRSG MEPILEX BORDER 4X8 (GAUZE/BANDAGES/DRESSINGS) ×2 IMPLANT
SYR 50ML LL SCALE MARK (SYRINGE) ×2 IMPLANT

## 2018-09-15 NOTE — ED Provider Notes (Signed)
[facility] EMERGENCY DEPARTMENT Provider Note   CSN: [id number] Arrival date & time: 09/15/18  0046    History   Chief Complaint Chief Complaint  Patient presents with  . Hip Injury    HPI [name] is a 68 y.o. male.   The history is provided by the patient.  He has history of chronic lymphocytic leukemia, coronary artery disease, GERD, hypertension, HIV, rheumatoid arthritis and comes in with a recurrent dislocation of his left hip.  He had a left hip arthroplasty done about 1 month ago but had dislocation in the postoperative period and had a revision done and had another hip dislocation following that.  Tonight, he was trying to position himself on his bed when his left hip dislocated again.  He denies other injury.  Past Medical History:  Diagnosis Date  . Allergy   . Anxiety   . Carotid artery occlusion    40-60% right ICA stenosis (09/2008)  . Cataract   . Chronic back pain   . CLL (chronic lymphoblastic leukemia) dx 2010   Followed at [facility] q65mo, no current therapy   . Clotting disorder ([place])   . Coronary artery disease 2010   s/p CABG '10, sees Dr. Percival Spanish  . Depression   . Diverticulosis   . DVT, lower extremity, recurrent ([place]) 2008, 2009   LLE, chronic anticoag since 2009  . Esophagitis   . Fibromyalgia   . Gallstones   . GERD (gastroesophageal reflux disease)   . Gout   . Gynecomastia, male   . H/O hiatal hernia 2008   surgery  . Hemorrhoids   . Hepatitis A yrs ago  . HIV infection ([place]) dx 1993  . Hypertension   . Impotence of organic origin   . Myocardial infarction ([place]) 2010    x 2  . Neuromuscular disorder (HCC)    neuropathy  . Osteoarthritis, knee    s/p B TKA  . Osteoporosis   . Pneumonia mrach, may, july 2017  . Rheumatoid arthritis(714.0) dx 2010   MTX, follows with rheum  . Seasonal allergies   . Secondary syphilis 07/24/14 dx   s/p 2 wks doxy  . Status post dilation of esophageal narrowing   . Stroke  ([place]) [place]   . TIA (transient ischemic attack) 1997   mild residual L mouth droop  . Tubular adenoma of colon     Patient Active Problem List   Diagnosis Date Noted  . Recurrent dislocation of hip joint prosthesis ([place]) 08/28/2018    Class: Acute  . Pneumonia of both lungs due to infectious organism 08/24/2018  . Status post total replacement of left hip 08/13/2018  . Avascular necrosis of bone of hip, left ([place]) 08/09/2018  . Eczema 07/01/2018  . Candida rash of groin 07/01/2018  . Tinea corporis 01/22/2018  . Depression with anxiety 01/14/2018  . 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 Carotid artery disease ([place]) 11/13/2016  . Stenosis of carotid artery 11/13/2016  . Spinal stenosis of thoracolumbar region 07/02/2016  . ILD (interstitial lung disease) ([place]) 09/13/2015  . Immunocompromised state ([place]) 09/13/2015  . PCP (pneumocystis jiroveci pneumonia) ([place]) 06/18/2015  . Postinflammatory pulmonary fibrosis ([place]) 05/11/2015  . GERD (gastroesophageal reflux disease)   . Hereditary and idiopathic peripheral neuropathy 09/08/2013  . Erosive esophagitis 12/28/2012  . Allergic rhinitis, cause unspecified 04/28/2012  . Long term current use of anticoagulant therapy 02/03/2012  . Hypertension   . DVT, lower extremity, recurrent ([place])   .  Gout   . Carotid artery occlusion   . Hyperlipidemia with target LDL less than 100   . HIV infection ([place]) 04/08/2011  . Arthritis, rheumatoid ([place]) 04/08/2011  . Chronic lymphoblastic leukemia 04/08/2011  . Impotence of organic origin 04/02/2011  . CAD (coronary artery disease) of artery bypass graft 02/14/2009  . Herpes genitalis 06/22/2008    Past Surgical History:  Procedure Laterality Date  . ANTERIOR HIP REVISION Left 08/17/2018   Procedure: ANTERIOR LEFT HIP REVISION;  Surgeon: Mcarthur Rossetti, MD;  Location: [facility];  Service: Orthopedics;  Laterality: Left;  . CHOLECYSTECTOMY    . COLONOSCOPY WITH PROPOFOL N/A  12/28/2012   Procedure: COLONOSCOPY WITH PROPOFOL;  Surgeon: Jerene Bears, MD;  [facility];  Service: Gastroenterology;  Laterality: N/A;  . CORONARY ARTERY BYPASS GRAFT  2010   triple bypass  . ESOPHAGOGASTRODUODENOSCOPY (EGD) WITH PROPOFOL N/A 12/28/2012   Procedure: ESOPHAGOGASTRODUODENOSCOPY (EGD) WITH PROPOFOL;  Surgeon: Jerene Bears, MD;  [facility];  Service: Gastroenterology;  Laterality: N/A;  . ESOPHAGOGASTRODUODENOSCOPY (EGD) WITH PROPOFOL N/A 03/15/2013   Procedure: ESOPHAGOGASTRODUODENOSCOPY (EGD) WITH PROPOFOL;  Surgeon: Jerene Bears, MD;  [facility];  Service: Gastroenterology;  Laterality: N/A;  . ESOPHAGOGASTRODUODENOSCOPY (EGD) WITH PROPOFOL N/A 02/07/2016   Procedure: ESOPHAGOGASTRODUODENOSCOPY (EGD) WITH PROPOFOL;  Surgeon: Jerene Bears, MD;  [facility];  Service: Gastroenterology;  Laterality: N/A;  . HARDWARE REMOVAL N/A 07/02/2012   Procedure: HARDWARE REMOVAL;  Surgeon: Elaina Hoops, MD;  Location: [facility];  Service: Neurosurgery;  Laterality: N/A;  . HIATAL HERNIA REPAIR     wrap  . HIP CLOSED REDUCTION Left 08/15/2018   Procedure: CLOSED REDUCTION HIP;  Surgeon: Newt Minion, MD;  Location: [facility];  Service: Orthopedics;  Laterality: Left;  . HIP CLOSED REDUCTION Left 08/28/2018   Procedure: CLOSED REDUCTION HIP FOR RECURRENT DISLOCATION AND DRESSING CHANGE;  Surgeon: Jessy Oto, MD;  Location: [facility];  Service: Orthopedics;  Laterality: Left;  . INGUINAL HERNIA REPAIR Bilateral   . JOINT REPLACEMENT Left 1999  . KNEE ARTHROPLASTY  07/22/2011   Procedure: COMPUTER ASSISTED TOTAL KNEE ARTHROPLASTY;  Surgeon: Meredith Pel, MD;  Location: [facility];  Service: Orthopedics;  Laterality: Right;  Right total knee arthroplasty  . MANDIBLE SURGERY Bilateral    tmj  . REPLACEMENT TOTAL KNEE Bilateral   . ring around testicle hernia reapir  184 and 1986   x 2  . ROTATOR CUFF REPAIR Right   . SHOULDER SURGERY Left   . SPINE  SURGERY  2010   "rod and screws", "failed", lopwer spine,   . stent to heart x 1  2010  . TONSILLECTOMY    . TOTAL HIP ARTHROPLASTY Left 08/13/2018   Procedure: LEFT TOTAL HIP ARTHROPLASTY ANTERIOR APPROACH;  Surgeon: Mcarthur Rossetti, MD;  Location: [facility];  Service: Orthopedics;  Laterality: Left;  . UMBILICAL HERNIA REPAIR     x 1  . varicose vein     stripping  . VIDEO BRONCHOSCOPY Bilateral 10/16/2015   Procedure: VIDEO BRONCHOSCOPY WITHOUT FLUORO;  Surgeon: Brand Males, MD;  [facility];  Service: Cardiopulmonary;  Laterality: Bilateral;        Home Medications    Prior to Admission medications   Medication Sig Start Date End Date Taking? Authorizing Provider  acetaminophen (TYLENOL) 500 MG tablet Take 500-1,000 mg by mouth every 6 (six) hours as needed for mild pain or headache.    [provider]  atorvastatin (  LIPITOR) 10 MG tablet Take 1 tablet (10 mg total) by mouth at bedtime. 04/05/18   [name], MD  clindamycin (CLEOCIN) 150 MG capsule Take 600 mg by mouth See admin instructions. Take 4 capsules (600 mg) by mouth 1 hour prior to dental appointments 09/25/16   [provider]  darunavir (PREZISTA) 800 MG tablet TAKE 1 TABLET (800 MG TOTAL) BY MOUTH DAILY. Patient taking differently: Take 800 mg by mouth daily.  08/06/18   Campbell Riches, MD  dextromethorphan (DELSYM) 30 MG/5ML liquid Take 5 mLs (30 mg total) by mouth 2 (two) times daily. Patient not taking: Reported on 08/28/2018 08/24/18   [name], MD  doxycycline (VIBRA-TABS) 100 MG tablet Take 1 tablet (100 mg total) by mouth 2 (two) times daily. 08/23/18   Mcarthur Rossetti, MD  elvitegravir-cobicistat-emtricitabine-tenofovir (GENVOYA) 150-150-200-10 MG TABS tablet Take 1 tablet by mouth daily with breakfast. 08/06/18   Campbell Riches, MD  escitalopram (LEXAPRO) 20 MG tablet Take 20 mg by mouth daily. 07/07/18   [provider]  famotidine (PEPCID) 40  MG tablet TAKE 1 TABLET BY MOUTH TWICE DAILY FOR 2 MONTHS Patient taking differently: Take 40 mg by mouth daily.  08/06/18   Pyrtle, Lajuan Lines, MD  folic acid (FOLVITE) 1 MG tablet Take 1 mg by mouth daily. 09/17/17   [provider]  furosemide (LASIX) 20 MG tablet Take 1 tablet (20 mg total) by mouth 2 (two) times daily. 02/10/18   [name], MD  HYDROcodone-acetaminophen (NORCO) 7.5-325 MG tablet Take 1-2 tablets by mouth every 6 (six) hours as needed for severe pain. 09/01/18   Mcarthur Rossetti, MD  leflunomide (ARAVA) 20 MG tablet Take 20 mg by mouth daily.  07/10/18   [provider]  nitroGLYCERIN (NITROSTAT) 0.4 MG SL tablet Place 1 tablet (0.4 mg total) under the tongue every 5 (five) minutes as needed for chest pain. 12/24/17 07/26/19  [name], MD  predniSONE (DELTASONE) 5 MG tablet Take 5 mg by mouth 2 (two) times daily.  11/02/16   [provider]  pregabalin (LYRICA) 200 MG capsule TAKE 1 CAPSULE BY MOUTH TWICE A DAY Patient taking differently: Take 200 mg by mouth 2 (two) times daily.  07/27/18   [name], MD  REXULTI 0.25 MG TABS Take 0.25 mg by mouth daily. 09/02/18   [name], MD  SAVAYSA 30 MG TABS tablet TAKE 1 TABLET (30 MG TOTAL) BY MOUTH DAILY. 08/06/18   [name], MD  sulfamethoxazole-trimethoprim (BACTRIM DS,SEPTRA DS) 800-160 MG tablet TAKE 1 TABLET BY MOUTH EVERY 12 (TWELVE) HOURS. 08/06/18   [name], MD  valACYclovir (VALTREX) 500 MG tablet Take 1 tablet (500 mg total) by mouth daily. 01/27/18   Campbell Riches, MD    Family History Family History  Problem Relation Age of Onset  . Breast cancer Mother   . Hypertension Mother   . Hyperlipidemia Mother   . Diabetes Mother   . Prostate cancer Father   . Colon polyps Father   . Hyperlipidemia Father   . Crohn's disease Paternal Aunt   . Diabetes Maternal Grandmother   . Diabetes Brother        x 3  . Heart attack Brother   . Heart disease Brother         x 3  . Heart attack Brother   . Hyperlipidemia Brother        x 3  . Alcohol abuse Daughter   .  Drug abuse Daughter   . Asthma Brother   . CVA Brother   . Colon cancer Neg Hx   . Esophageal cancer Neg Hx   . Rectal cancer Neg Hx   . Stomach cancer Neg Hx     Social History Social History   Tobacco Use  . Smoking status: Never Smoker  . Smokeless tobacco: Never Used  . Tobacco comment: occ wine  Substance Use Topics  . Alcohol use: Yes    Comment: occasional wine- 1-2 per week  . Drug use: No     Allergies   Golimumab; Orencia [abatacept]; Other; Peanut-containing drug products; Morphine; Oxycodone-acetaminophen; Penicillins; and Promethazine hcl   Review of Systems Review of Systems  All other systems reviewed and are negative.    Physical Exam Updated Vital Signs BP 132/71 (BP Location: Left Arm)   Pulse (!) 112   Temp 98 F (36.7 C) (Oral)   Resp 18   Ht 5\' 9"  (1.753 m)   Wt 86.2 kg   SpO2 98%   BMI 28.06 kg/m   Physical Exam Vitals signs and nursing note reviewed.    68 year old male, resting comfortably and in no acute distress. Vital signs are significant for elevated heart rate. Oxygen saturation is 98%, which is normal. Head is normocephalic and atraumatic. PERRLA, EOMI. Oropharynx is clear. Neck is nontender and supple without adenopathy or JVD. Back is nontender and there is no CVA tenderness. Lungs are clear without rales, wheezes, or rhonchi. Chest is nontender. Heart has regular rate and rhythm without murmur. Abdomen is soft, flat, nontender without masses or hepatosplenomegaly and peristalsis is normoactive. Extremities: Left leg is shortened and externally rotated.  Distal neurovascular exam is intact with strong pulses, prompt capillary refill, normal sensation. Skin is warm and dry without rash. Neurologic: Mental status is normal, cranial nerves are intact, there are no motor or sensory deficits.  ED Treatments / Results  Labs  (all labs ordered are listed, but only abnormal results are displayed) Labs Reviewed  BASIC METABOLIC PANEL - Abnormal; Notable for the following components:      Result Value   Potassium 3.3 (*)    Glucose, Bld 115 (*)    Calcium 8.0 (*)    All other components within normal limits  CBC WITH DIFFERENTIAL/PLATELET - Abnormal; Notable for the following components:   RBC 3.36 (*)    Hemoglobin 10.7 (*)    HCT 34.7 (*)    MCV 103.3 (*)    RDW 16.4 (*)    Platelets 149 (*)    Lymphs Abs 6.2 (*)    Monocytes Absolute 1.1 (*)    All other components within normal limits  SARS CORONAVIRUS 2 (HOSPITAL ORDER, [facility])    Radiology Dg Hip [place] W Or [place] Pelvis 1 View Left  Result Date: 09/15/2018 CLINICAL DATA:  Postreduction EXAM: DG HIP (WITH OR WITHOUT PELVIS) 1V PORT LEFT COMPARISON:  09/15/2018 FINDINGS: Continued superior dislocation of the left hip replacement, unchanged. IMPRESSION: Continued superior dislocation. Electronically Signed   By: Rolm Baptise M.D.   On: 09/15/2018 02:59   Dg Hip Unilat W Or Wo Pelvis 2-3 Views Left  Result Date: 09/15/2018 CLINICAL DATA:  Possible dislocation EXAM: DG HIP (WITH OR WITHOUT PELVIS) 2-3V LEFT COMPARISON:  08/15/2018 FINDINGS: There is dislocation of the left hip replacement with the femoral head component dislocated superiorly. No fracture. IMPRESSION: Superior dislocation of the left hip replacement. Electronically Signed  By: Rolm Baptise M.D.   On: 09/15/2018 01:47    Procedures .Sedation Date/Time: 09/15/2018 2:30 AM Performed by: Delora Fuel, MD Authorized by: Delora Fuel, MD   Consent:    Consent obtained:  Verbal   Consent given by:  Patient   Risks discussed:  Allergic reaction, dysrhythmia, inadequate sedation, nausea, prolonged hypoxia resulting in organ damage, prolonged sedation necessitating reversal, respiratory compromise necessitating ventilatory assistance and intubation and vomiting    Alternatives discussed:  Analgesia without sedation, anxiolysis and regional anesthesia Universal protocol:    Procedure explained and questions answered to patient or proxy's satisfaction: yes     Relevant documents present and verified: yes     Test results available and properly labeled: yes     Imaging studies available: yes     Required blood products, implants, devices, and special equipment available: yes     Site/side marked: yes     Immediately prior to procedure a time out was called: yes     Patient identity confirmation method:  Verbally with patient Indications:    Procedure necessitating sedation performed by:  Physician performing sedation Pre-sedation assessment:    Time since last food or drink:  8 hours   ASA classification: class 2 - patient with mild systemic disease     Neck mobility: normal     Mouth opening:  3 or more finger widths   Thyromental distance:  4 finger widths   Mallampati score:  I - soft palate, uvula, fauces, pillars visible   Pre-sedation assessments completed and reviewed: airway patency, cardiovascular function, hydration status, mental status, nausea/vomiting, pain level, respiratory function and temperature     Pre-sedation assessment completed:  09/15/2018 2:30 AM Immediate pre-procedure details:    Reassessment: Patient reassessed immediately prior to procedure     Reviewed: vital signs, relevant labs/tests and NPO status     Verified: bag valve mask available, emergency equipment available, intubation equipment available, IV patency confirmed, oxygen available and suction available   Procedure details (see MAR for exact dosages):    Preoxygenation:  Nasal cannula   Sedation:  Propofol   Intra-procedure monitoring:  Blood pressure monitoring, cardiac monitor, continuous pulse oximetry, frequent LOC assessments, frequent vital sign checks and continuous capnometry   Intra-procedure events: none     Total Provider sedation time (minutes):  30  Post-procedure details:    Post-sedation assessment completed:  09/15/2018 3:00 AM   Attendance: Constant attendance by certified staff until patient recovered     Recovery: Patient returned to pre-procedure baseline     Post-sedation assessments completed and reviewed: airway patency, cardiovascular function, hydration status, mental status, nausea/vomiting, pain level, respiratory function and temperature     Patient is stable for discharge or admission: yes     Patient tolerance:  Tolerated well, no immediate complications Reduction of dislocation Date/Time: 09/15/2018 2:47 AM Performed by: Delora Fuel, MD Authorized by: Delora Fuel, MD  Consent: Written consent obtained. Risks and benefits: risks, benefits and alternatives were discussed Consent given by: patient Patient understanding: patient states understanding of the procedure being performed Patient consent: the patient's understanding of the procedure matches consent given Procedure consent: procedure consent matches procedure scheduled Relevant documents: relevant documents present and verified Test results: test results available and properly labeled Site marked: the operative site was marked Imaging studies: imaging studies available Required items: required blood products, implants, devices, and special equipment available Patient identity confirmed: verbally with patient and arm band Time out: Immediately prior to procedure a "  time out" was called to verify the correct patient, procedure, equipment, support staff and site/side marked as required. Local anesthesia used: no  Anesthesia: Local anesthesia used: no  Sedation: Patient sedated: yes Sedatives: propofol Analgesia: morphine Sedation start date/time: 09/15/2018 2:30 AM Sedation end date/time: 09/15/2018 3:00 AM Vitals: Vital signs were monitored during sedation.  Patient tolerance: Patient tolerated the procedure well with no immediate complications Comments: Hip  reduction done by manual traction. Knee immobilizer applied. Post reduction x-ray ordered.      Medications Ordered in ED Medications  HYDROmorphone (DILAUDID) injection 0.5 mg (0.5 mg Intravenous Given 09/15/18 0121)  propofol (DIPRIVAN) 10 mg/mL bolus/IV push 43.1 mg (43.1 mg Intravenous Given 09/15/18 0234)     Initial Impression / Assessment and Plan / ED Course  I have reviewed the triage vital signs and the nursing notes.  Pertinent labs & imaging results that were available during my care of the patient were reviewed by me and considered in my medical decision making (see chart for details).  Recurrent left hip dislocation.  He will be sent for x-rays.  Old records are reviewed confirming left hip arthroplasty complicated by dislocation in the immediate postoperative period followed by revision arthroplasty and subsequent ED visit for dislocation.  X-rays confirmed dislocation.  Because of his complicated history, I discussed case with Dr.Yates who is on-call for Dr.Blackman.  He requested I attempt to reduce the dislocation.  Patient was placed under sedation with propofol and seemed to have a good reduction.  Knee immobilizer was applied.  Unfortunately, postreduction x-ray showed the hip was not reduced.  I suspect that it had been reduced and popped out of place.  Dr. Lorin Mercy was consulted once again who requested patient be admitted for reduction in the operating room in the morning.  Final Clinical Impressions(s) / ED Diagnoses   Final diagnoses:  Dislocation of hip joint prosthesis, initial encounter [facility])  Macrocytic anemia    ED Discharge Orders    None       Delora Fuel, MD 45/40/98 [phone]

## 2018-09-15 NOTE — ED Triage Notes (Signed)
BIB EMS from [facility]. Pt was pushing himself up in bed when he felt a pop in his L hip. Dislocation confirmed by on-site XRay. +PMS distal to injury. Here for similar ~2wks ago. V/S WNL.

## 2018-09-15 NOTE — Brief Op Note (Signed)
09/15/2018  2:30 PM  PATIENT:  [name]  69 y.o. male  PRE-OPERATIVE DIAGNOSIS:  Dislocation of hip joint  POST-OPERATIVE DIAGNOSIS:  Dislocation of hip joint  PROCEDURE:  Procedure(s): CLOSED REDUCTION HIP DISLOCATION (Left)  SURGEON:  Surgeon(s) and Role:    Mcarthur Rossetti, MD - Primary  ANESTHESIA:   general  DICTATION: .Other Dictation: Dictation Number [id number]  PLAN OF CARE: Admit to inpatient   PATIENT DISPOSITION:  PACU - hemodynamically stable.   Delay start of Pharmacological VTE agent (>24hrs) due to surgical blood loss or risk of bleeding: no

## 2018-09-15 NOTE — Op Note (Signed)
NAME: [name], [name]. MEDICAL RECORD [id number] ACCOUNT [id number] DATE OF BIRTH:[birth year] FACILITY: [facility] LOCATION: [facility] PHYSICIAN:Samier Jaco Kerry Fort, MD  OPERATIVE REPORT  DATE OF PROCEDURE:  09/15/2018  PREOPERATIVE DIAGNOSES:  Left unstable total hip arthroplasty with recurrent posterior dislocation.  POSTOPERATIVE DIAGNOSES:  Left unstable total hip arthroplasty with recurrent posterior dislocation.  PROCEDURE:  Closed reduction of left hip dislocation under general anesthesia and direct fluoroscopy.  SURGEON:  Lind Guest.  Ninfa Linden, MD  ANESTHESIA:  General.  ESTIMATED BLOOD LOSS:  Not applicable.  ANTIBIOTICS:  None.  COMPLICATIONS:  None.  INDICATIONS:  The patient is a 68 year old gentleman well known to me.  He unfortunately has severe kyphosis of his spine and actually has extensive spinal fusion all the way to the pelvis.  He then developed significant avascular necrosis of his left  hip with femoral head collapse, so about a month ago, we took him to the operating room for more acute left total hip arthroplasty.  Given his acute pelvis, I tried Antivert the components as much as possible, but he still ended up dislocating his hip,  which is a higher dislocation rate in this population.  I then revised acetabular component, opened up some more, gave him more offset and more length.  We had him in a hip abduction brace as well.  He has now dislocated 2 more times since then.  At this  point, I recommended a closed reduction today and then setting him up for more extensive surgery with revision of the acetabular component in a day or 2 from now.  He consents today, certainly to have his hip reduced closed.  DESCRIPTION OF PROCEDURE:  After informed consent was obtained and appropriate left hip was marked.  He was brought to the operating room where general anesthesia was obtained while he was on a stretcher.  I placed traction boots on both his feet and   placed him supine on the Hana fracture table.  A time-out was called and he was identified as the correct patient, correct left hip.  I then assessed the hip under direct fluoroscopy and with traction and rotation, was able to easily reduce the hip,  again verified under clinical exam and fluoroscopy.  We then placed a knee immobilizer back on his knee and placed him on the stretcher.    He was awakened, extubated, and taken to recovery room in stable condition.    All final counts were correct.    There were no complications noted.  AN/NUANCE  D:09/15/2018 T:09/15/2018 JOB:[id number]

## 2018-09-15 NOTE — Anesthesia Procedure Notes (Signed)
Procedure Name: Intubation Date/Time: 09/15/2018 2:01 PM Performed by: Harden Mo, CRNA Pre-anesthesia Checklist: Patient identified, Emergency Drugs available, Suction available and Patient being monitored Patient Re-evaluated:Patient Re-evaluated prior to induction Oxygen Delivery Method: Circle System Utilized Preoxygenation: Pre-oxygenation with 100% oxygen Induction Type: IV induction and Rapid sequence Laryngoscope Size: Miller and 2 Grade View: Grade I Tube type: Oral Tube size: 7.5 mm Number of attempts: 1 Airway Equipment and Method: Stylet and Oral airway Placement Confirmation: ETT inserted through vocal cords under direct vision,  positive ETCO2 and breath sounds checked- equal and bilateral Secured at: 23 cm Tube secured with: Tape Dental Injury: Teeth and Oropharynx as per pre-operative assessment

## 2018-09-15 NOTE — ED Notes (Signed)
Patient transported to X-ray 

## 2018-09-15 NOTE — Social Work (Signed)
Acknowledging that pt is from [facility], await post surgical recommendations for next steps. CSW continuing to follow for support with disposition when medically appropriate.  Westley Hummer, MSW, [place] Work [phone]

## 2018-09-15 NOTE — OR Nursing (Signed)
20ml serous fluid removed from left hip  By md , no orders received

## 2018-09-15 NOTE — Anesthesia Preprocedure Evaluation (Addendum)
Anesthesia Evaluation  Patient identified by MRN, date of birth, ID band Patient awake    Reviewed: Allergy & Precautions, H&P , NPO status , Patient's Chart, lab work & pertinent test results  Airway Mallampati: II  TM Distance: >3 FB Neck ROM: full    Dental  (+) Teeth Intact, Dental Advisory Given   Pulmonary neg pulmonary ROS,    breath sounds clear to auscultation       Cardiovascular hypertension, + CAD, + Past MI and + CABG   Rhythm:regular Rate:Normal     Neuro/Psych PSYCHIATRIC DISORDERS Anxiety Depression TIA Neuromuscular disease    GI/Hepatic hiatal hernia, PUD, GERD  ,Esophageal narrowing   Endo/Other    Renal/GU      Musculoskeletal  (+) Arthritis , Fibromyalgia -  Abdominal   Peds  Hematology  (+) anemia , HIV, CLL   Anesthesia Other Findings   Reproductive/Obstetrics                            Anesthesia Physical Anesthesia Plan  ASA: III  Anesthesia Plan: General   Post-op Pain Management:    Induction: Intravenous  PONV Risk Score and Plan: 2 and Ondansetron, Propofol infusion and Treatment may vary due to age or medical condition  Airway Management Planned: Oral ETT  Additional Equipment:   Intra-op Plan:   Post-operative Plan: Extubation in OR  Informed Consent: I have reviewed the patients History and Physical, chart, labs and discussed the procedure including the risks, benefits and alternatives for the proposed anesthesia with the patient or authorized representative who has indicated his/her understanding and acceptance.     Dental advisory given  Plan Discussed with: CRNA, Anesthesiologist and Surgeon  Anesthesia Plan Comments:        Anesthesia Quick Evaluation

## 2018-09-15 NOTE — Transfer of Care (Signed)
Immediate Anesthesia Transfer of Care Note  Patient: [name]  Procedure(s) Performed: CLOSED REDUCTION HIP DISLOCATION (Left Hip)  Patient Location: PACU  Anesthesia Type:General  Level of Consciousness: awake, alert  and oriented  Airway & Oxygen Therapy: Patient Spontanous Breathing and Patient connected to nasal cannula oxygen  Post-op Assessment: Report given to RN, Post -op Vital signs reviewed and stable and Patient moving all extremities X 4  Post vital signs: Reviewed and stable  Last Vitals:  Vitals Value Taken Time  BP 126/80 09/15/2018  2:31 PM  Temp    Pulse 97 09/15/2018  2:34 PM  Resp 9 09/15/2018  2:34 PM  SpO2 99 % 09/15/2018  2:34 PM  Vitals shown include unvalidated device data.  Last Pain:  Vitals:   09/15/18 1216  TempSrc: Oral  PainSc:       Patients Stated Pain Goal: 2 (62/95/28 4132)  Complications: No apparent anesthesia complications

## 2018-09-15 NOTE — ED Notes (Signed)
ED TO INPATIENT HANDOFF REPORT  ED Nurse Name and Phone #:   Freida Busman  [phone]  S Name/Age/Gender [name] 68 y.o. male Room/Bed: 031C/031C  Code Status   Code Status: Prior  Home/SNF/Other Rehab Patient oriented to: self, place, time and situation Is this baseline? Yes   Triage Complete: Triage complete  Chief Complaint lf hip dislocation  Triage Note BIB EMS from [facility]. Pt was pushing himself up in bed when he felt a pop in his L hip. Dislocation confirmed by on-site XRay. +PMS distal to injury. Here for similar ~2wks ago. V/S WNL.    Allergies Allergies  Allergen Reactions  . Golimumab Anaphylaxis    Simponi) ARIA  . Orencia [Abatacept] Anaphylaxis  . Other Anaphylaxis and Hives    Pecans  . Peanut-Containing Drug Products Anaphylaxis, Hives and Swelling    Swelling of throat  . Morphine Other (See Comments)    Severe headache- Can tolerate Dilaudid, however  . Oxycodone-Acetaminophen Other (See Comments)    Headache  . Penicillins Rash and Other (See Comments)    FLUSHED, RED Has patient had a PCN reaction causing immediate rash, facial/tongue/throat swelling, SOB or lightheadedness with hypotension: Yes Has patient had a PCN reaction causing severe rash involving mucus membranes or skin necrosis: No Has patient had a PCN reaction that required hospitalization: No Has patient had a PCN reaction occurring within the last 10 years: No If all of the above answers are "NO", then may proceed with Cephalosporin use  . Promethazine Hcl Other (See Comments)    Makes him feel "drunk" at higher strengths    Level of Care/Admitting Diagnosis ED Disposition    ED Disposition Condition [place]: [place] [[id number]]  Level of Care: Med-Surg [16]  Covid Evaluation: Screening Protocol (No Symptoms)  Diagnosis: Closed dislocation of left hip [facility]) [[id number]]  Admitting Physician: Marybelle Killings [[id number]]  Attending  Physician: Mcarthur Rossetti [[id number]]  PT Class (Do Not Modify): Observation [104]  PT Acc Code (Do Not Modify): Observation [10022]       B Medical/Surgery History Past Medical History:  Diagnosis Date  . Allergy   . Anxiety   . Carotid artery occlusion    40-60% right ICA stenosis (09/2008)  . Cataract   . Chronic back pain   . CLL (chronic lymphoblastic leukemia) dx 2010   Followed at [facility] q[facility], no current therapy   . Clotting disorder ([place])   . Coronary artery disease 2010   s/p CABG '10, sees Dr. Percival Spanish  . Depression   . Diverticulosis   . DVT, lower extremity, recurrent ([place]) 2008, 2009   LLE, chronic anticoag since 2009  . Esophagitis   . Fibromyalgia   . Gallstones   . GERD (gastroesophageal reflux disease)   . Gout   . Gynecomastia, male   . H/O hiatal hernia 2008   surgery  . Hemorrhoids   . Hepatitis A yrs ago  . HIV infection ([place]) dx 1993  . Hypertension   . Impotence of organic origin   . Myocardial infarction ([place]) 2010    x 2  . Neuromuscular disorder ([facility])    neuropathy  . Osteoarthritis, knee    s/p B TKA  . Osteoporosis   . Pneumonia mrach, may, july 2017  . Rheumatoid arthritis(714.0) dx 2010   MTX, follows with rheum  . Seasonal allergies   . Secondary syphilis 07/24/14 dx   s/p 2 wks  doxy  . Status post dilation of esophageal narrowing   . Stroke ([place]) [place]   . TIA (transient ischemic attack) 1997   mild residual L mouth droop  . Tubular adenoma of colon    Past Surgical History:  Procedure Laterality Date  . ANTERIOR HIP REVISION Left 08/17/2018   Procedure: ANTERIOR LEFT HIP REVISION;  Surgeon: Mcarthur Rossetti, MD;  Location: [place];  Service: Orthopedics;  Laterality: Left;  . CHOLECYSTECTOMY    . COLONOSCOPY WITH PROPOFOL N/A 12/28/2012   Procedure: COLONOSCOPY WITH PROPOFOL;  Surgeon: Jerene Bears, MD;  Location: [facility];  Service: Gastroenterology;  Laterality: N/A;  . CORONARY ARTERY BYPASS GRAFT  2010    triple bypass  . ESOPHAGOGASTRODUODENOSCOPY (EGD) WITH PROPOFOL N/A 12/28/2012   Procedure: ESOPHAGOGASTRODUODENOSCOPY (EGD) WITH PROPOFOL;  Surgeon: Jerene Bears, MD;  [facility]: [facility];  Service: Gastroenterology;  Laterality: N/A;  . ESOPHAGOGASTRODUODENOSCOPY (EGD) WITH PROPOFOL N/A 03/15/2013   Procedure: ESOPHAGOGASTRODUODENOSCOPY (EGD) WITH PROPOFOL;  Surgeon: Jerene Bears, MD;  [facility];  Service: Gastroenterology;  Laterality: N/A;  . ESOPHAGOGASTRODUODENOSCOPY (EGD) WITH PROPOFOL N/A 02/07/2016   Procedure: ESOPHAGOGASTRODUODENOSCOPY (EGD) WITH PROPOFOL;  Surgeon: Jerene Bears, MD;  [facility];  Service: Gastroenterology;  Laterality: N/A;  . HARDWARE REMOVAL N/A 07/02/2012   Procedure: HARDWARE REMOVAL;  Surgeon: Elaina Hoops, MD;  Location: [place] [facility];  Service: Neurosurgery;  Laterality: N/A;  . HIATAL HERNIA REPAIR     wrap  . HIP CLOSED REDUCTION Left 08/15/2018   Procedure: CLOSED REDUCTION HIP;  Surgeon: Newt Minion, MD;  Location: [place];  Service: Orthopedics;  Laterality: Left;  . HIP CLOSED REDUCTION Left 08/28/2018   Procedure: CLOSED REDUCTION HIP FOR RECURRENT DISLOCATION AND DRESSING CHANGE;  Surgeon: Jessy Oto, MD;  Location: [place];  Service: Orthopedics;  Laterality: Left;  . INGUINAL HERNIA REPAIR Bilateral   . JOINT REPLACEMENT Left 1999  . KNEE ARTHROPLASTY  07/22/2011   Procedure: COMPUTER ASSISTED TOTAL KNEE ARTHROPLASTY;  Surgeon: Meredith Pel, MD;  Location: [place];  Service: Orthopedics;  Laterality: Right;  Right total knee arthroplasty  . MANDIBLE SURGERY Bilateral    tmj  . REPLACEMENT TOTAL KNEE Bilateral   . ring around testicle hernia reapir  184 and 1986   x 2  . ROTATOR CUFF REPAIR Right   . SHOULDER SURGERY Left   . SPINE SURGERY  2010   "rod and screws", "failed", lopwer spine,   . stent to heart x 1  2010  . TONSILLECTOMY    . TOTAL HIP ARTHROPLASTY Left 08/13/2018   Procedure: LEFT TOTAL HIP  ARTHROPLASTY ANTERIOR APPROACH;  Surgeon: Mcarthur Rossetti, MD;  Location: [place];  Service: Orthopedics;  Laterality: Left;  . UMBILICAL HERNIA REPAIR     x 1  . varicose vein     stripping  . VIDEO BRONCHOSCOPY Bilateral 10/16/2015   Procedure: VIDEO BRONCHOSCOPY WITHOUT FLUORO;  Surgeon: Brand Males, MD;  [facility];  Service: Cardiopulmonary;  Laterality: Bilateral;     A IV Location/Drains/Wounds Patient Lines/Drains/Airways Status   Active Line/Drains/Airways    Name:   Placement date:   Placement time:   Site:   Days:   Peripheral IV 09/15/18 Right Antecubital   09/15/18    0116    Antecubital   less than 1   Incision (Closed) 08/15/18 Hip Left   08/15/18    1244     31   Incision (  Closed) 08/17/18 Hip Left   08/17/18    1348     29   Incision (Closed) 08/28/18 Hip Left   08/28/18    1605     18   Pressure Injury 08/28/18 Stage I -  Intact skin with non-blanchable redness of a localized area usually over a bony prominence. skin tear    08/28/18    1940     18          Intake/Output Last 24 hours No intake or output data in the 24 hours ending 09/15/18 0325  Labs/Imaging Results for orders placed or performed during the hospital encounter of 09/15/18 (from the past 48 hour(s))  Basic metabolic panel     Status: Abnormal   Collection Time: 09/15/18  1:02 AM  Result Value Ref Range   Sodium 135 135 - 145 mmol/L   Potassium 3.3 (L) 3.5 - 5.1 mmol/L   Chloride 98 98 - 111 mmol/L   CO2 27 22 - 32 mmol/L   Glucose, Bld 115 (H) 70 - 99 mg/dL   BUN 11 8 - 23 mg/dL   Creatinine, Ser 0.68 0.61 - 1.24 mg/dL   Calcium 8.0 (L) 8.9 - 10.3 mg/dL   GFR calc non Af Amer >60 >60 mL/min   GFR calc Af Amer >60 >60 mL/min   Anion gap 10 5 - 15    Comment: Performed at [facility], [place] [address], [place], [place] [address]  CBC with Differential     Status: Abnormal   Collection Time: 09/15/18  1:02 AM  Result Value Ref Range   WBC 9.1 4.0 - 10.5 K/uL    RBC 3.36 (L) 4.22 - 5.81 MIL/uL   Hemoglobin 10.7 (L) 13.0 - 17.0 g/dL   HCT 34.7 (L) 39.0 - 52.0 %   MCV 103.3 (H) 80.0 - 100.0 fL   MCH 31.8 26.0 - 34.0 pg   MCHC 30.8 30.0 - 36.0 g/dL   RDW 16.4 (H) 11.5 - 15.5 %   Platelets 149 (L) 150 - 400 K/uL   nRBC 0.0 0.0 - 0.2 %   Neutrophils Relative % 20 %   Neutro Abs 1.8 1.7 - 7.7 K/uL   Lymphocytes Relative 68 %   Lymphs Abs 6.2 (H) 0.7 - 4.0 K/uL   Monocytes Relative 12 %   Monocytes Absolute 1.1 (H) 0.1 - 1.0 K/uL   Eosinophils Relative 0 %   Eosinophils Absolute 0.0 0.0 - 0.5 K/uL   Basophils Relative 0 %   Basophils Absolute 0.0 0.0 - 0.1 K/uL   Immature Granulocytes 0 %   Abs Immature Granulocytes 0.04 0.00 - 0.07 K/uL    Comment: Performed at [facility], [place] [address] [place], [place] [address]   *Note: Due to a large number of results and/or encounters for the requested time period, some results have not been displayed. A complete set of results can be found in Results Review.   Dg Hip Port Unilat W Or Wo Pelvis 1 View Left  Result Date: 09/15/2018 CLINICAL DATA:  Postreduction EXAM: DG HIP (WITH OR WITHOUT PELVIS) 1V PORT LEFT COMPARISON:  09/15/2018 FINDINGS: Continued superior dislocation of the left hip replacement, unchanged. IMPRESSION: Continued superior dislocation. Electronically Signed   By: Rolm Baptise M.D.   On: 09/15/2018 02:59   Dg Hip Unilat W Or Wo Pelvis 2-3 Views Left  Result Date: 09/15/2018 CLINICAL DATA:  Possible dislocation EXAM: DG HIP (WITH OR WITHOUT PELVIS) 2-3V LEFT  COMPARISON:  08/15/2018 FINDINGS: There is dislocation of the left hip replacement with the femoral head component dislocated superiorly. No fracture. IMPRESSION: Superior dislocation of the left hip replacement. Electronically Signed   By: Rolm Baptise M.D.   On: 09/15/2018 01:47    Pending Labs Unresulted Labs (From admission, onward)    Start     Ordered   09/15/18 0258  SARS Coronavirus 2 [facility] order, Performed in  [place] hospital lab)  (Novel Coronavirus, [facility] Order))  Once,   R     09/15/18 0258          Vitals/Pain Today's Vitals   09/15/18 0245 09/15/18 0256 09/15/18 0300 09/15/18 0315  BP: 106/72  118/77 126/67  Pulse: 96  92 91  Resp: 16  10 12   Temp:      TempSrc:      SpO2: 100%  100% 100%  Weight:      Height:      PainSc:  2       Isolation Precautions No active isolations  Medications Medications  HYDROmorphone (DILAUDID) injection 0.5 mg (0.5 mg Intravenous Given 09/15/18 0121)  propofol (DIPRIVAN) 10 mg/mL bolus/IV push 43.1 mg (43.1 mg Intravenous Given 09/15/18 0234)    Mobility walks with device Moderate fall risk   Focused Assessments Cardiac Assessment Handoff:  Cardiac Rhythm: Normal sinus rhythm Lab Results  Component Value Date   CKTOTAL 92 04/19/2015   CKMB 4.9 (H) 04/19/2015   TROPONINI <0.03 05/15/2016   Lab Results  Component Value Date   DDIMER 0.38 04/19/2015   Does the Patient currently have chest pain? No     R Recommendations: See Admitting Provider Note  Report given to:   Additional Notes:   n/a

## 2018-09-15 NOTE — ED Notes (Signed)
Additional blood labs drawn (1 lavender, 4 SST) and on hold in Main Lab, per order.

## 2018-09-15 NOTE — H&P (Signed)
[name] is an 68 y.o. male.   Chief Complaint: Left prosthetic hip dislocation HPI: The patient is well-known to me.  He is over a month out from a total hip arthroplasty of the left hip.  He has severe kyphosis of his spine with diffuse spine all the way to the pelvis.  His pelvis is in a posterior orientation in terms of the hip socket.  When I performed a total hip arthroplasty we felt that we could anteverted the components enough to avoid a posterior dislocation and this surgery was done through a direct inter-approach.  The hip is dislocated once again.  He has been in a hip abduction brace.  At this point I am recommending at least a closed reduction under anesthesia today of his left hip and then revising the left hip acetabular component later this week.  He understands this fully.  Past Medical History:  Diagnosis Date  . Allergy   . Anxiety   . Carotid artery occlusion    40-60% right ICA stenosis (09/2008)  . Cataract   . Chronic back pain   . CLL (chronic lymphoblastic leukemia) dx 2010   Followed at [facility] q80mo, no current therapy   . Clotting disorder ([place])   . Coronary artery disease 2010   s/p CABG '10, sees Dr. Percival Spanish  . Depression   . Diverticulosis   . DVT, lower extremity, recurrent ([place]) 2008, 2009   LLE, chronic anticoag since 2009  . Esophagitis   . Fibromyalgia   . Gallstones   . GERD (gastroesophageal reflux disease)   . Gout   . Gynecomastia, male   . H/O hiatal hernia 2008   surgery  . Hemorrhoids   . Hepatitis A yrs ago  . HIV infection ([place]) dx 1993  . Hypertension   . Impotence of organic origin   . Myocardial infarction ([place]) 2010    x 2  . Neuromuscular disorder (HCC)    neuropathy  . Osteoarthritis, knee    s/p B TKA  . Osteoporosis   . Pneumonia mrach, may, july 2017  . Rheumatoid arthritis(714.0) dx 2010   MTX, follows with rheum  . Seasonal allergies   . Secondary syphilis 07/24/14 dx   s/p 2 wks doxy  . Status post dilation of  esophageal narrowing   . Stroke ([place]) [place]   . TIA (transient ischemic attack) 1997   mild residual L mouth droop  . Tubular adenoma of colon     Past Surgical History:  Procedure Laterality Date  . ANTERIOR HIP REVISION Left 08/17/2018   Procedure: ANTERIOR LEFT HIP REVISION;  Surgeon: Mcarthur Rossetti, MD;  Location: [facility];  Service: Orthopedics;  Laterality: Left;  . CHOLECYSTECTOMY    . COLONOSCOPY WITH PROPOFOL N/A 12/28/2012   Procedure: COLONOSCOPY WITH PROPOFOL;  Surgeon: Jerene Bears, MD;  [facility];  Service: Gastroenterology;  Laterality: N/A;  . CORONARY ARTERY BYPASS GRAFT  2010   triple bypass  . ESOPHAGOGASTRODUODENOSCOPY (EGD) WITH PROPOFOL N/A 12/28/2012   Procedure: ESOPHAGOGASTRODUODENOSCOPY (EGD) WITH PROPOFOL;  Surgeon: Jerene Bears, MD;  [facility];  Service: Gastroenterology;  Laterality: N/A;  . ESOPHAGOGASTRODUODENOSCOPY (EGD) WITH PROPOFOL N/A 03/15/2013   Procedure: ESOPHAGOGASTRODUODENOSCOPY (EGD) WITH PROPOFOL;  Surgeon: Jerene Bears, MD;  [facility];  Service: Gastroenterology;  Laterality: N/A;  . ESOPHAGOGASTRODUODENOSCOPY (EGD) WITH PROPOFOL N/A 02/07/2016   Procedure: ESOPHAGOGASTRODUODENOSCOPY (EGD) WITH PROPOFOL;  Surgeon: Jerene Bears, MD;  [facility];  Service: Gastroenterology;  Laterality: N/A;  . HARDWARE REMOVAL N/A 07/02/2012   Procedure: HARDWARE REMOVAL;  Surgeon: Elaina Hoops, MD;  Location: [facility];  Service: Neurosurgery;  Laterality: N/A;  . HIATAL HERNIA REPAIR     wrap  . HIP CLOSED REDUCTION Left 08/15/2018   Procedure: CLOSED REDUCTION HIP;  Surgeon: Newt Minion, MD;  Location: [facility];  Service: Orthopedics;  Laterality: Left;  . HIP CLOSED REDUCTION Left 08/28/2018   Procedure: CLOSED REDUCTION HIP FOR RECURRENT DISLOCATION AND DRESSING CHANGE;  Surgeon: Jessy Oto, MD;  Location: [facility];  Service: Orthopedics;  Laterality: Left;  . INGUINAL HERNIA REPAIR Bilateral   .  JOINT REPLACEMENT Left 1999  . KNEE ARTHROPLASTY  07/22/2011   Procedure: COMPUTER ASSISTED TOTAL KNEE ARTHROPLASTY;  Surgeon: Meredith Pel, MD;  Location: [facility];  Service: Orthopedics;  Laterality: Right;  Right total knee arthroplasty  . MANDIBLE SURGERY Bilateral    tmj  . REPLACEMENT TOTAL KNEE Bilateral   . ring around testicle hernia reapir  184 and 1986   x 2  . ROTATOR CUFF REPAIR Right   . SHOULDER SURGERY Left   . SPINE SURGERY  2010   "rod and screws", "failed", lopwer spine,   . stent to heart x 1  2010  . TONSILLECTOMY    . TOTAL HIP ARTHROPLASTY Left 08/13/2018   Procedure: LEFT TOTAL HIP ARTHROPLASTY ANTERIOR APPROACH;  Surgeon: Mcarthur Rossetti, MD;  Location: [facility];  Service: Orthopedics;  Laterality: Left;  . UMBILICAL HERNIA REPAIR     x 1  . varicose vein     stripping  . VIDEO BRONCHOSCOPY Bilateral 10/16/2015   Procedure: VIDEO BRONCHOSCOPY WITHOUT FLUORO;  Surgeon: Brand Males, MD;  [facility];  Service: Cardiopulmonary;  Laterality: Bilateral;    Family History  Problem Relation Age of Onset  . Breast cancer Mother   . Hypertension Mother   . Hyperlipidemia Mother   . Diabetes Mother   . Prostate cancer Father   . Colon polyps Father   . Hyperlipidemia Father   . Crohn's disease Paternal Aunt   . Diabetes Maternal Grandmother   . Diabetes Brother        x 3  . Heart attack Brother   . Heart disease Brother        x 3  . Heart attack Brother   . Hyperlipidemia Brother        x 3  . Alcohol abuse Daughter   . Drug abuse Daughter   . Asthma Brother   . CVA Brother   . Colon cancer Neg Hx   . Esophageal cancer Neg Hx   . Rectal cancer Neg Hx   . Stomach cancer Neg Hx    Social History:  reports that he has never smoked. He has never used smokeless tobacco. He reports current alcohol use. He reports that he does not use drugs.  Allergies:  Allergies  Allergen Reactions  . Golimumab Anaphylaxis    Simponi) ARIA  .  Orencia [Abatacept] Anaphylaxis  . Other Anaphylaxis and Hives    Pecans  . Peanut-Containing Drug Products Anaphylaxis, Hives and Swelling    Swelling of throat  . Morphine Other (See Comments)    Severe headache- Can tolerate Dilaudid, however  . Oxycodone-Acetaminophen Other (See Comments)    Headache  . Penicillins Rash and Other (See Comments)    FLUSHED, RED Has patient had a PCN reaction causing immediate rash, facial/tongue/throat swelling, SOB  or lightheadedness with hypotension: Yes Has patient had a PCN reaction causing severe rash involving mucus membranes or skin necrosis: No Has patient had a PCN reaction that required hospitalization: No Has patient had a PCN reaction occurring within the last 10 years: No If all of the above answers are "NO", then may proceed with Cephalosporin use  . Promethazine Hcl Other (See Comments)    Makes him feel "drunk" at higher strengths    Medications Prior to Admission  Medication Sig Dispense Refill  . acetaminophen (TYLENOL) 500 MG tablet Take 500-1,000 mg by mouth every 6 (six) hours as needed for mild pain or headache.    [name] atorvastatin (LIPITOR) 10 MG tablet Take 1 tablet (10 mg total) by mouth at bedtime. 30 tablet 11  . darunavir (PREZISTA) 800 MG tablet TAKE 1 TABLET (800 MG TOTAL) BY MOUTH DAILY. (Patient taking differently: Take 800 mg by mouth daily. ) 90 tablet 0  . elvitegravir-cobicistat-emtricitabine-tenofovir (GENVOYA) 150-150-200-10 MG TABS tablet Take 1 tablet by mouth daily with breakfast. 90 tablet 0  . EPINEPHrine (EPIPEN 2-PAK) 0.3 mg/0.3 mL IJ SOAJ injection Inject 0.3 mg into the muscle as needed for anaphylaxis.    [name] escitalopram (LEXAPRO) 20 MG tablet Take 20 mg by mouth daily.    . famotidine (PEPCID) 40 MG tablet TAKE 1 TABLET BY MOUTH TWICE DAILY FOR 2 MONTHS (Patient taking differently: Take 40 mg by mouth 2 (two) times daily. ) 169 tablet 0  . folic acid (FOLVITE) 1 MG tablet Take 1 mg by mouth daily.  11  .  furosemide (LASIX) 20 MG tablet Take 1 tablet (20 mg total) by mouth 2 (two) times daily. 90 tablet 3  . HYDROcodone-acetaminophen (NORCO) 7.5-325 MG tablet Take 1-2 tablets by mouth every 6 (six) hours as needed for severe pain. 30 tablet 0  . leflunomide (ARAVA) 20 MG tablet Take 20 mg by mouth daily.     . nitroGLYCERIN (NITROSTAT) 0.4 MG SL tablet Place 1 tablet (0.4 mg total) under the tongue every 5 (five) minutes as needed for chest pain. 25 tablet 2  . polyethylene glycol (MIRALAX / GLYCOLAX) 17 g packet Take 17 g by mouth daily.    . predniSONE (DELTASONE) 5 MG tablet Take 5 mg by mouth 2 (two) times daily.   1  . pregabalin (LYRICA) 200 MG capsule TAKE 1 CAPSULE BY MOUTH TWICE A DAY (Patient taking differently: Take 200 mg by mouth 2 (two) times daily. ) 180 capsule 1  . REXULTI 0.25 MG TABS Take 0.25 mg by mouth daily. 90 tablet 1  . SAVAYSA 30 MG TABS tablet TAKE 1 TABLET (30 MG TOTAL) BY MOUTH DAILY. 30 tablet 5  . senna (SENOKOT) 8.6 MG TABS tablet Take 1 tablet by mouth daily.    [name] sulfamethoxazole-trimethoprim (BACTRIM DS,SEPTRA DS) 800-160 MG tablet TAKE 1 TABLET BY MOUTH EVERY 12 (TWELVE) HOURS. 60 tablet 3  . traMADol (ULTRAM) 50 MG tablet Take 50 mg by mouth 2 (two) times daily.    . valACYclovir (VALTREX) 500 MG tablet Take 1 tablet (500 mg total) by mouth daily. 90 tablet 3    Results for orders placed or performed during the hospital encounter of 09/15/18 (from the past 48 hour(s))  Basic metabolic panel     Status: Abnormal   Collection Time: 09/15/18  1:02 AM  Result Value Ref Range   Sodium 135 135 - 145 mmol/L   Potassium 3.3 (L) 3.5 - 5.1 mmol/L   Chloride 98 98 -  111 mmol/L   CO2 27 22 - 32 mmol/L   Glucose, Bld 115 (H) 70 - 99 mg/dL   BUN 11 8 - 23 mg/dL   Creatinine, Ser 0.68 0.61 - 1.24 mg/dL   Calcium 8.0 (L) 8.9 - 10.3 mg/dL   GFR calc non Af Amer >60 >60 mL/min   GFR calc Af Amer >60 >60 mL/min   Anion gap 10 5 - 15    Comment: Performed at [facility] [address] [place], [place] [address]  CBC with Differential     Status: Abnormal   Collection Time: 09/15/18  1:02 AM  Result Value Ref Range   WBC 9.1 4.0 - 10.5 K/uL   RBC 3.36 (L) 4.22 - 5.81 MIL/uL   Hemoglobin 10.7 (L) 13.0 - 17.0 g/dL   HCT 34.7 (L) 39.0 - 52.0 %   MCV 103.3 (H) 80.0 - 100.0 fL   MCH 31.8 26.0 - 34.0 pg   MCHC 30.8 30.0 - 36.0 g/dL   RDW 16.4 (H) 11.5 - 15.5 %   Platelets 149 (L) 150 - 400 K/uL   nRBC 0.0 0.0 - 0.2 %   Neutrophils Relative % 20 %   Neutro Abs 1.8 1.7 - 7.7 K/uL   Lymphocytes Relative 68 %   Lymphs Abs 6.2 (H) 0.7 - 4.0 K/uL   Monocytes Relative 12 %   Monocytes Absolute 1.1 (H) 0.1 - 1.0 K/uL   Eosinophils Relative 0 %   Eosinophils Absolute 0.0 0.0 - 0.5 K/uL   Basophils Relative 0 %   Basophils Absolute 0.0 0.0 - 0.1 K/uL   Immature Granulocytes 0 %   Abs Immature Granulocytes 0.04 0.00 - 0.07 K/uL    Comment: Performed at [facility], [place] [address], [place], [place] [address]  SARS Coronavirus 2 [facility] order, Performed in [facility] hospital lab)     Status: None   Collection Time: 09/15/18  3:32 AM  Result Value Ref Range   SARS Coronavirus 2 NEGATIVE NEGATIVE    Comment: (NOTE) If result is NEGATIVE SARS-CoV-2 target nucleic acids are NOT DETECTED. The SARS-CoV-2 RNA is generally detectable in upper and lower  respiratory specimens during the acute phase of infection. The lowest  concentration of SARS-CoV-2 viral copies this assay can detect is 250  copies / mL. A negative result does not preclude SARS-CoV-2 infection  and should not be used as the sole basis for treatment or other  patient management decisions.  A negative result may occur with  improper specimen collection / handling, submission of specimen other  than nasopharyngeal swab, presence of viral mutation(s) within the  areas targeted by this assay, and inadequate number of viral copies  (<250 copies / mL). A negative result must be  combined with clinical  observations, patient history, and epidemiological information. If result is POSITIVE SARS-CoV-2 target nucleic acids are DETECTED. The SARS-CoV-2 RNA is generally detectable in upper and lower  respiratory specimens dur ing the acute phase of infection.  Positive  results are indicative of active infection with SARS-CoV-2.  Clinical  correlation with patient history and other diagnostic information is  necessary to determine patient infection status.  Positive results do  not rule out bacterial infection or co-infection with other viruses. If result is PRESUMPTIVE POSTIVE SARS-CoV-2 nucleic acids MAY BE PRESENT.   A presumptive positive result was obtained on the submitted specimen  and confirmed on repeat testing.  While 2019 novel coronavirus  (SARS-CoV-2) nucleic  acids may be present in the submitted sample  additional confirmatory testing may be necessary for epidemiological  and / or clinical management purposes  to differentiate between  SARS-CoV-2 and other Sarbecovirus currently known to infect humans.  If clinically indicated additional testing with an alternate test  methodology [phone]) is advised. The SARS-CoV-2 RNA is generally  detectable in upper and lower respiratory sp ecimens during the acute  phase of infection. The expected result is Negative. Fact Sheet for Patients:  StrictlyIdeas.no Fact Sheet for Healthcare Providers: BankingDealers.co.za This test is not yet approved or cleared by the [place] FDA and has been authorized for detection and/or diagnosis of SARS-CoV-2 by FDA under an Emergency Use Authorization (EUA).  This EUA will remain in effect (meaning this test can be used) for the duration of the COVID-19 declaration under Section 564(b)(1) of the Act, 21 U.S.C. section 360bbb-3(b)(1), unless the authorization is terminated or revoked sooner. Performed at [facility], [place] [address] [place], [place] [address]    *Note: Due to a large number of results and/or encounters for the requested time period, some results have not been displayed. A complete set of results can be found in Results Review.   Dg Hip Port Unilat W Or Wo Pelvis 1 View Left  Result Date: 09/15/2018 CLINICAL DATA:  Postreduction EXAM: DG HIP (WITH OR WITHOUT PELVIS) 1V PORT LEFT COMPARISON:  09/15/2018 FINDINGS: Continued superior dislocation of the left hip replacement, unchanged. IMPRESSION: Continued superior dislocation. Electronically Signed   By: Rolm Baptise M.D.   On: 09/15/2018 02:59   Dg Hip Unilat W Or Wo Pelvis 2-3 Views Left  Result Date: 09/15/2018 CLINICAL DATA:  Possible dislocation EXAM: DG HIP (WITH OR WITHOUT PELVIS) 2-3V LEFT COMPARISON:  08/15/2018 FINDINGS: There is dislocation of the left hip replacement with the femoral head component dislocated superiorly. No fracture. IMPRESSION: Superior dislocation of the left hip replacement. Electronically Signed   By: Rolm Baptise M.D.   On: 09/15/2018 01:47    Review of Systems  All other systems reviewed and are negative.   Blood pressure (!) 112/92, pulse 98, temperature 98 F (36.7 C), temperature source Oral, resp. rate 15, height 5\' 9"  (1.753 m), weight 86.2 kg, SpO2 97 %. Physical Exam  Constitutional: He is oriented to person, place, and time. He appears well-developed and well-nourished.  HENT:  Head: Normocephalic and atraumatic.  Eyes: Pupils are equal, round, and reactive to light. EOM are normal.  Neck: Normal range of motion. Neck supple.  Cardiovascular: Normal rate.  Respiratory: Effort normal.  GI: Soft.  Musculoskeletal:     Left hip: He exhibits deformity.  Neurological: He is alert and oriented to person, place, and time.  Skin: Skin is warm.  Psychiatric: He has a normal mood and affect.    His left leg is shortened and externally rotated consistent with a  dislocation.  Assessment/Plan Recurrent dislocation left total hip arthroplasty  He will remain n.p.o. today until we can get him down to the operating room for a closed reduction of his left hip under anesthesia.  We will keep him then admitted to the hospital for a revision arthroplasty of that left hip acetabular opponent for later this week.  The risk and benefits of this have been explained to him in detail.  Mcarthur Rossetti, MD 09/15/2018, 7:54 AM

## 2018-09-16 ENCOUNTER — Ambulatory Visit: Payer: Medicare Other | Admitting: Orthopaedic Surgery

## 2018-09-16 ENCOUNTER — Encounter (HOSPITAL_COMMUNITY): Payer: Self-pay | Admitting: Orthopaedic Surgery

## 2018-09-16 LAB — CBC
HCT: 36.5 % — ABNORMAL LOW (ref 39.0–52.0)
Hemoglobin: 11.3 g/dL — ABNORMAL LOW (ref 13.0–17.0)
MCH: 31.7 pg (ref 26.0–34.0)
MCHC: 31 g/dL (ref 30.0–36.0)
MCV: 102.5 fL — ABNORMAL HIGH (ref 80.0–100.0)
Platelets: 168 10*3/uL (ref 150–400)
RBC: 3.56 MIL/uL — ABNORMAL LOW (ref 4.22–5.81)
RDW: 16.4 % — ABNORMAL HIGH (ref 11.5–15.5)
WBC: 12 10*3/uL — ABNORMAL HIGH (ref 4.0–10.5)
nRBC: 0 % (ref 0.0–0.2)

## 2018-09-16 MED ORDER — ONDANSETRON HCL 4 MG/2ML IJ SOLN
4.0000 mg | Freq: Four times a day (QID) | INTRAMUSCULAR | Status: DC | PRN
  Administered 2018-09-16: 4 mg via INTRAVENOUS
  Filled 2018-09-16: qty 2

## 2018-09-16 MED ORDER — BISACODYL 10 MG RE SUPP
10.0000 mg | Freq: Every day | RECTAL | Status: DC | PRN
  Filled 2018-09-16: qty 1

## 2018-09-16 MED ORDER — ACETAMINOPHEN 325 MG PO TABS
650.0000 mg | ORAL_TABLET | Freq: Four times a day (QID) | ORAL | Status: DC | PRN
  Administered 2018-09-16: 650 mg via ORAL
  Filled 2018-09-16: qty 2

## 2018-09-16 NOTE — Social Work (Signed)
CSW received a call from [facility], [phone].  Pt is an "ortho bundle." CSW continuing to follow for support with disposition when medically appropriate.  Westley Hummer, MSW, [place] Work [phone]

## 2018-09-16 NOTE — TOC Initial Note (Signed)
Transition of Care [facility]) - Initial/Assessment Note    Patient Details  Name: [name] MRN: [id number] Date of Birth: [birth year]  [facility]) CM/SW Contact:    Alexander [place], [place] Phone Number: 09/16/2018, 10:11 AM  Clinical Narrative:                 CSW met with pt at bedside. Introduced self, role, reason for visit. Pt expresses that he "feels like crap." We discussed his multiple admissions and recent discharge to [facility]. Prior to initial hospitalization pt was living at home, his adult daughter is his main support, and relatively independent. Pt unhappy with [facility] this time around and prefers another SNF. Pt aware his surgery is tomorrow and expresses to this writer "I am tired I am not sure if I can do another surgery." Pt states he and Dr. Ninfa Linden have had multiple discussions around his surgery and CSW encouraged pt to be forthcoming with his surgeon and his family regarding what he does and does not want surgically in the future should he return to the hospital.  CSW provided emotional support and explained SNF referral process, we discussed insurance coverage and the supplemental coverage pt has- pt states relative understanding. CSW will f/u with pt and provide support and assistance with obtaining new placement.   Expected Discharge Plan: Skilled Nursing Facility Barriers to Discharge: Continued Medical Work up   Patient Goals and CMS Choice Patient states their goals for this hospitalization and ongoing recovery are:: to feel better CMS Medicare.gov Compare Post Acute Care list provided to:: Patient Choice offered to / list presented to : Patient  Expected Discharge Plan and Services Expected Discharge Plan: [place]   Discharge Planning Services: NA Post Acute Care Choice: [place] Living arrangements for the past 2 months: [place], [place]                  Prior Living  Arrangements/Services Living arrangements for the past 2 months: [place], [place] Lives with:: Self Patient language and need for interpreter reviewed:: Yes(no needs) Do you feel safe going back to the place where you live?: Yes(only after rehab)      Need for Family Participation in Patient Care: Yes (Comment)(emotional support; physical assist after SNF) Care giver support system in place?: Yes (comment)(adult children, SNF)   Criminal Activity/Legal Involvement Pertinent to Current Situation/Hospitalization: No - Comment as needed  Activities of Daily Living Home Assistive Devices/Equipment: Walker (specify type) ADL Screening (condition at time of admission) Patient's cognitive ability adequate to safely complete daily activities?: Yes Is the patient deaf or have difficulty hearing?: No Does the patient have difficulty seeing, even when wearing glasses/contacts?: No Does the patient have difficulty concentrating, remembering, or making decisions?: No Patient able to express need for assistance with ADLs?: Yes Does the patient have difficulty dressing or bathing?: Yes Independently performs ADLs?: No Communication: Independent Grooming: Independent Feeding: Independent Does the patient have difficulty walking or climbing stairs?: Yes Weakness of Legs: Both Weakness of Arms/Hands: None  Permission Sought/Granted Permission sought to share information with : Family Supports, Chartered certified accountant granted to share information with : Yes, Verbal Permission Granted  Share Information with NAME: [name]  Permission granted to share info w AGENCY: SNFs  Permission granted to share info w Relationship: daughter  Permission granted to share info w Contact Information: [phone]  Emotional Assessment Appearance:: Appears stated age Attitude/Demeanor/Rapport: Engaged  Affect (typically observed): Overwhelmed, Sad,  Tearful/Crying Orientation: : Oriented to Self, Oriented to Place, Oriented to  Time, Oriented to Situation Alcohol / Substance Use: Not Applicable Psych Involvement: Outpatient Provider  Admission diagnosis:  Macrocytic anemia [D53.9] Dislocation of hip joint prosthesis, initial encounter ([place]) [B44.967R, Z96.649] Patient Active Problem List   Diagnosis Date Noted  . Closed dislocation of left hip ([place]) 09/15/2018  . Recurrent dislocation of hip joint prosthesis ([place]) 08/28/2018    Class: Acute  . Pneumonia of both lungs due to infectious organism 08/24/2018  . Status post total replacement of left hip 08/13/2018  . Avascular necrosis of bone of hip, left ([place]) 08/09/2018  . Eczema 07/01/2018  . Candida rash of groin 07/01/2018  . Tinea corporis 01/22/2018  . Depression with anxiety 01/14/2018  . 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 Carotid artery disease ([place]) 11/13/2016  . Stenosis of carotid artery 11/13/2016  . Spinal stenosis of thoracolumbar region 07/02/2016  . ILD (interstitial lung disease) ([place]) 09/13/2015  . Immunocompromised state ([place]) 09/13/2015  . PCP (pneumocystis jiroveci pneumonia) ([place]) 06/18/2015  . Postinflammatory pulmonary fibrosis ([place]) 05/11/2015  . GERD (gastroesophageal reflux disease)   . Hereditary and idiopathic peripheral neuropathy 09/08/2013  . Erosive esophagitis 12/28/2012  . Allergic rhinitis, cause unspecified 04/28/2012  . Long term current use of anticoagulant therapy 02/03/2012  . Hypertension   . DVT, lower extremity, recurrent ([place])   . Gout   . Carotid artery occlusion   . Hyperlipidemia with target LDL less than 100   . HIV infection ([place]) 04/08/2011  . Arthritis, rheumatoid ([place]) 04/08/2011  . Chronic lymphoblastic leukemia 04/08/2011  . Impotence of organic origin 04/02/2011  . CAD (coronary artery disease) of artery bypass graft 02/14/2009  . Herpes genitalis 06/22/2008   PCP:  [name], MD Pharmacy:  No  Pharmacies Listed    Social Determinants of Health (SDOH) Interventions    Readmission Risk Interventions Readmission Risk Prevention Plan 09/16/2018  Transportation Screening Complete  Medication Review ([place]) Complete  PCP or Specialist appointment within 3-5 days of discharge Complete  HRI or [place] Not Complete  HRI or Home Care Consult Pt Refusal Comments plan for pt to dc to SNF  SW Recovery Care/Counseling Consult Complete  Palliative Care Screening Not Applicable  Skilled Nursing Facility Complete  Some recent data might be hidden

## 2018-09-16 NOTE — Progress Notes (Signed)
Patient ID: [name], male   DOB: [birth year], 68 y.o.   MRN: [id number] I spoke with the patient in length and detail about hi left hip instability.  Our plan is to proceed to the OR tomorrow for a revision of the acetabular component.  Will put it in more antiversion with a lipped liner vs a constrained liner.  Will continue bed rest today.

## 2018-09-16 NOTE — Care Plan (Signed)
RNCM received call in the evening on 09/14/18 and voice message left after hours informing that her father's hip dislocated again. He is currently at [facility] and will be transferred to the hospital per the staff. Dr. Ninfa Linden is currently aware of dislocation of Left hip. Will plan on reduction this week along with revision of Left hip arthroplasty anticipated Friday, 09/17/18. Patient remains an ortho bundle patient and RNCM will be following along during hospitalization. Family would like for patient to return to SNF, but at another facility if possible.

## 2018-09-16 NOTE — Progress Notes (Signed)
Orthopedic Tech Progress Note Patient Details:  [name] April 27, 1951 [id number]  Ortho Devices Ortho Device/Splint Location: Trapeze bar Ortho Device/Splint Interventions: Application   Post Interventions Patient Tolerated: Well Instructions Provided: Care of device   [name] 09/16/2018, 12:45 PM

## 2018-09-16 NOTE — Anesthesia Postprocedure Evaluation (Signed)
Anesthesia Post Note  Patient: [name]  Procedure(s) Performed: CLOSED REDUCTION HIP DISLOCATION (Left Hip)     Patient location during evaluation: PACU Anesthesia Type: General Level of consciousness: awake and alert Pain management: pain level controlled Vital Signs Assessment: post-procedure vital signs reviewed and stable Respiratory status: spontaneous breathing, nonlabored ventilation, respiratory function stable and patient connected to nasal cannula oxygen Cardiovascular status: blood pressure returned to baseline and stable Postop Assessment: no apparent nausea or vomiting Anesthetic complications: no    Last Vitals:  Vitals:   09/15/18 2318 09/16/18 0416  BP: 114/76 120/76  Pulse: (!) 105 98  Resp: 18 18  Temp: 37 C 37.4 C  SpO2: 97% 98%    Last Pain:  Vitals:   09/16/18 0742  TempSrc:   PainSc: 0-No pain                 Mohanad Carsten S

## 2018-09-16 NOTE — NC FL2 (Addendum)
[place] LEVEL OF CARE SCREENING TOOL     IDENTIFICATION  Patient Name: [name] Birthdate: Apr 25, 1951 Sex: male Admission Date (Current Location): 09/15/2018  [facility] and [place] Number:  Herbalist and Address:  [facility], [place] [address] [place], [place] [address]      Provider Number: [id number]  Attending Physician Name and Address:  Mcarthur Rossetti,*  Relative Name and Phone Number:  [name] (daughter) [phone]    Current Level of Care: Hospital Recommended Level of Care: [place] Prior Approval Number:    Date Approved/Denied: 03/27/09 PASRR Number: [id number]  Discharge Plan: SNF    Current Diagnoses: Patient Active Problem List   Diagnosis Date Noted  . Closed dislocation of left hip ([place]) 09/15/2018  . Recurrent dislocation of hip joint prosthesis ([place]) 08/28/2018    Class: Acute  . Pneumonia of both lungs due to infectious organism 08/24/2018  . Status post total replacement of left hip 08/13/2018  . Avascular necrosis of bone of hip, left ([place]) 08/09/2018  . Eczema 07/01/2018  . Candida rash of groin 07/01/2018  . Tinea corporis 01/22/2018  . Depression with anxiety 01/14/2018  . 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 Carotid artery disease ([place]) 11/13/2016  . Stenosis of carotid artery 11/13/2016  . Spinal stenosis of thoracolumbar region 07/02/2016  . ILD (interstitial lung disease) ([place]) 09/13/2015  . Immunocompromised state ([place]) 09/13/2015  . PCP (pneumocystis jiroveci pneumonia) ([place]) 06/18/2015  . Postinflammatory pulmonary fibrosis ([place]) 05/11/2015  . GERD (gastroesophageal reflux disease)   . Hereditary and idiopathic peripheral neuropathy 09/08/2013  . Erosive esophagitis 12/28/2012  . Allergic rhinitis, cause unspecified 04/28/2012  . Long term current use of anticoagulant therapy 02/03/2012  . Hypertension   . DVT, lower extremity, recurrent ([place])    . Gout   . Carotid artery occlusion   . Hyperlipidemia with target LDL less than 100   . HIV infection ([place]) 04/08/2011  . Arthritis, rheumatoid ([place]) 04/08/2011  . Chronic lymphoblastic leukemia 04/08/2011  . Impotence of organic origin 04/02/2011  . CAD (coronary artery disease) of artery bypass graft 02/14/2009  . Herpes genitalis 06/22/2008    Orientation RESPIRATION BLADDER Height & Weight     Self, Time, Situation, Place  Normal Continent Weight: 190 lb (86.2 kg) Height:  5\' 9"  (175.3 cm)  BEHAVIORAL SYMPTOMS/MOOD NEUROLOGICAL BOWEL NUTRITION STATUS      Continent Diet(please see discharge summary)  AMBULATORY STATUS COMMUNICATION OF NEEDS Skin   Extensive Assist Verbally PU Stage and Appropriate Care, Surgical wounds, Other (Comment)                       Personal Care Assistance Level of Assistance  Bathing, Feeding, Dressing, Total care Bathing Assistance: Limited assistance Feeding assistance: Independent Dressing Assistance: Limited assistance Total Care Assistance: Limited assistance   Functional Limitations Info  Sight, Hearing, Speech Sight Info: Adequate Hearing Info: Adequate Speech Info: Adequate    SPECIAL CARE FACTORS FREQUENCY  PT (By licensed PT), OT (By licensed OT)     PT Frequency: 5x week OT Frequency: 5x week            Contractures Contractures Info: Not present    Additional Factors Info  Code Status, Allergies, Psychotropic Code Status Info: Full Code Allergies Info: GOLIMUMAB, ORENCIA ABATACEPT, OTHER, PEANUT-CONTAINING DRUG PRODUCTS, MORPHINE, OXYCODONE-ACETAMINOPHEN, PENICILLINS, PROMETHAZINE HCL   Psychotropic Info: brexpiprazole (REXULTI) .25mg  PO daily;  escitalopram (LEXAPRO) 20 mg PO  daily          Current Medications (09/16/2018):  This is the current hospital active medication list Current Facility-Administered Medications  Medication Dose Route Frequency Provider Last Rate Last Dose  . 0.9 %  sodium chloride  infusion   Intravenous Continuous Mcarthur Rossetti, MD 10 mL/hr at 09/16/18 [phone]    . atorvastatin (LIPITOR) tablet 10 mg  10 mg Oral QHS Mcarthur Rossetti, MD   10 mg at 09/15/18 2151  . bisacodyl (DULCOLAX) suppository 10 mg  10 mg Rectal Daily PRN Mcarthur Rossetti, MD      . brexpiprazole (REXULTI) 0.25 mg per half tablet 0.25 mg  0.25 mg Oral Daily Mcarthur Rossetti, MD   0.25 mg at 09/16/18 0930  . darunavir (PREZISTA) tablet 800 mg  800 mg Oral Q breakfast Mcarthur Rossetti, MD   800 mg at 09/16/18 2458  . elvitegravir-cobicistat-emtricitabine-tenofovir (GENVOYA) 150-150-200-10 MG tablet 1 tablet  1 tablet Oral Q breakfast Mcarthur Rossetti, MD   1 tablet at 09/16/18 [phone]  . escitalopram (LEXAPRO) tablet 20 mg  20 mg Oral Daily Mcarthur Rossetti, MD   20 mg at 09/16/18 0930  . famotidine (PEPCID) tablet 40 mg  40 mg Oral BID Mcarthur Rossetti, MD   40 mg at 09/16/18 0930  . folic acid (FOLVITE) tablet 1 mg  1 mg Oral Daily Mcarthur Rossetti, MD   1 mg at 09/16/18 0930  . furosemide (LASIX) tablet 20 mg  20 mg Oral BID Mcarthur Rossetti, MD   20 mg at 09/16/18 3382  . HYDROmorphone (DILAUDID) injection 1 mg  1 mg Intravenous Q2H PRN Mcarthur Rossetti, MD   1 mg at 09/15/18 0858  . HYDROmorphone (DILAUDID) tablet 2 mg  2 mg Oral Q4H PRN Mcarthur Rossetti, MD      . lactated ringers infusion   Intravenous Continuous Mcarthur Rossetti, MD 10 mL/hr at 09/16/18 0400    . leflunomide (ARAVA) tablet 20 mg  20 mg Oral Daily Mcarthur Rossetti, MD   20 mg at 09/16/18 0930  . methocarbamol (ROBAXIN) tablet 500 mg  500 mg Oral Q6H PRN Mcarthur Rossetti, MD   500 mg at 09/15/18 2151  . nitroGLYCERIN (NITROSTAT) SL tablet 0.4 mg  0.4 mg Sublingual Q5 min PRN Mcarthur Rossetti, MD      . ondansetron [facility]) injection 4 mg  4 mg Intravenous Q6H PRN Mcarthur Rossetti, MD   4 mg at 09/16/18 0848  .  polyethylene glycol (MIRALAX / GLYCOLAX) packet 17 g  17 g Oral Daily Mcarthur Rossetti, MD   17 g at 09/15/18 1542  . predniSONE (DELTASONE) tablet 5 mg  5 mg Oral BID WC Mcarthur Rossetti, MD   5 mg at 09/16/18 0729  . pregabalin (LYRICA) capsule 200 mg  200 mg Oral BID Mcarthur Rossetti, MD   200 mg at 09/16/18 0931  . senna (SENOKOT) tablet 8.6 mg  1 tablet Oral Daily Mcarthur Rossetti, MD   8.6 mg at 09/15/18 1542  . valACYclovir (VALTREX) tablet 500 mg  500 mg Oral Daily Mcarthur Rossetti, MD   500 mg at 09/16/18 5053     Discharge Medications: Please see discharge summary for a list of discharge medications.  Relevant Imaging Results:  Relevant Lab Results:   Additional Information SSN: [id number]  [place], [place]

## 2018-09-17 ENCOUNTER — Encounter (HOSPITAL_COMMUNITY): Payer: Self-pay | Admitting: Certified Registered Nurse Anesthetist

## 2018-09-17 ENCOUNTER — Inpatient Hospital Stay (HOSPITAL_COMMUNITY): Payer: Medicare Other | Admitting: Certified Registered Nurse Anesthetist

## 2018-09-17 ENCOUNTER — Inpatient Hospital Stay (HOSPITAL_COMMUNITY): Payer: Medicare Other

## 2018-09-17 ENCOUNTER — Encounter (HOSPITAL_COMMUNITY)
Admission: EM | Disposition: A | Discharge status: Skilled Nursing Facility | Payer: Self-pay | Source: Home / Self Care | Attending: Orthopaedic Surgery

## 2018-09-17 DIAGNOSIS — T84021A Dislocation of internal left hip prosthesis, initial encounter: Principal | ICD-10-CM

## 2018-09-17 DIAGNOSIS — Z96649 Presence of unspecified artificial hip joint: Secondary | ICD-10-CM

## 2018-09-17 DIAGNOSIS — Z96642 Presence of left artificial hip joint: Secondary | ICD-10-CM

## 2018-09-17 DIAGNOSIS — T84029A Dislocation of unspecified internal joint prosthesis, initial encounter: Secondary | ICD-10-CM

## 2018-09-17 HISTORY — PX: TOTAL HIP ARTHROPLASTY: SHX124

## 2018-09-17 LAB — GLUCOSE, CAPILLARY: Glucose-Capillary: 85 mg/dL (ref 70–99)

## 2018-09-17 SURGERY — ARTHROPLASTY, HIP, TOTAL, ANTERIOR APPROACH
Anesthesia: General | Site: Hip | Laterality: Left

## 2018-09-17 MED ORDER — HYDROMORPHONE HCL 1 MG/ML IJ SOLN: 0.5000 mg | INTRAMUSCULAR | Status: DC | PRN

## 2018-09-17 MED ORDER — BUPIVACAINE LIPOSOME 1.3 % IJ SUSP
20.0000 mL | INTRAMUSCULAR | Status: DC
  Filled 2018-09-17: qty 20

## 2018-09-17 MED ORDER — CLINDAMYCIN PHOSPHATE 900 MG/50ML IV SOLN
INTRAVENOUS | Status: DC | PRN
  Administered 2018-09-17: 900 mg via INTRAVENOUS

## 2018-09-17 MED ORDER — ACETAMINOPHEN 10 MG/ML IV SOLN: 1000.0000 mg | Freq: Once | INTRAVENOUS | Status: DC | PRN

## 2018-09-17 MED ORDER — SODIUM CHLORIDE (PF) 0.9 % IJ SOLN
INTRAMUSCULAR | Status: AC
  Filled 2018-09-17: qty 10

## 2018-09-17 MED ORDER — CLINDAMYCIN PHOSPHATE 900 MG/50ML IV SOLN
INTRAVENOUS | Status: AC
  Filled 2018-09-17: qty 50

## 2018-09-17 MED ORDER — PHENOL 1.4 % MT LIQD: 1.0000 | OROMUCOSAL | Status: DC | PRN

## 2018-09-17 MED ORDER — SUCCINYLCHOLINE CHLORIDE 20 MG/ML IJ SOLN
INTRAMUSCULAR | Status: DC | PRN
  Administered 2018-09-17: 80 mg via INTRAVENOUS

## 2018-09-17 MED ORDER — ZOLPIDEM TARTRATE 5 MG PO TABS: 5.0000 mg | ORAL_TABLET | Freq: Every evening | ORAL | Status: DC | PRN

## 2018-09-17 MED ORDER — OXYCODONE HCL 5 MG PO TABS
5.0000 mg | ORAL_TABLET | ORAL | Status: DC | PRN
  Administered 2018-09-17 – 2018-09-18 (×2): 10 mg via ORAL
  Administered 2018-09-18 – 2018-09-22 (×3): 5 mg via ORAL
  Filled 2018-09-17: qty 2
  Filled 2018-09-17: qty 1
  Filled 2018-09-17 (×2): qty 2
  Filled 2018-09-17: qty 1

## 2018-09-17 MED ORDER — SUGAMMADEX SODIUM 200 MG/2ML IV SOLN
INTRAVENOUS | Status: DC | PRN
  Administered 2018-09-17: 200 mg via INTRAVENOUS

## 2018-09-17 MED ORDER — ONDANSETRON HCL 4 MG PO TABS: 4.0000 mg | ORAL_TABLET | Freq: Four times a day (QID) | ORAL | Status: DC | PRN

## 2018-09-17 MED ORDER — DEXAMETHASONE SODIUM PHOSPHATE 10 MG/ML IJ SOLN
INTRAMUSCULAR | Status: DC | PRN
  Administered 2018-09-17: 8 mg via INTRAVENOUS

## 2018-09-17 MED ORDER — DIPHENHYDRAMINE HCL 12.5 MG/5ML PO ELIX: 12.5000 mg | ORAL_SOLUTION | ORAL | Status: DC | PRN

## 2018-09-17 MED ORDER — ALBUMIN HUMAN 5 % IV SOLN
INTRAVENOUS | Status: DC | PRN
  Administered 2018-09-17: 11:00:00 via INTRAVENOUS

## 2018-09-17 MED ORDER — OXYCODONE HCL 5 MG PO TABS
10.0000 mg | ORAL_TABLET | ORAL | Status: DC | PRN
  Administered 2018-09-19: 15 mg via ORAL
  Filled 2018-09-17: qty 3

## 2018-09-17 MED ORDER — VANCOMYCIN HCL 1000 MG IV SOLR
INTRAVENOUS | Status: AC
  Filled 2018-09-17: qty 1000

## 2018-09-17 MED ORDER — ONDANSETRON HCL 4 MG/2ML IJ SOLN
INTRAMUSCULAR | Status: AC
  Filled 2018-09-17: qty 2

## 2018-09-17 MED ORDER — LIDOCAINE 2% (20 MG/ML) 5 ML SYRINGE
INTRAMUSCULAR | Status: DC | PRN
  Administered 2018-09-17: 60 mg via INTRAVENOUS

## 2018-09-17 MED ORDER — FENTANYL CITRATE (PF) 100 MCG/2ML IJ SOLN
25.0000 ug | INTRAMUSCULAR | Status: DC | PRN
  Administered 2018-09-17 (×2): 50 ug via INTRAVENOUS

## 2018-09-17 MED ORDER — METOCLOPRAMIDE HCL 5 MG PO TABS
5.0000 mg | ORAL_TABLET | Freq: Three times a day (TID) | ORAL | Status: DC | PRN
  Administered 2018-09-22: 5 mg via ORAL
  Filled 2018-09-17: qty 1

## 2018-09-17 MED ORDER — MENTHOL 3 MG MT LOZG: 1.0000 | LOZENGE | OROMUCOSAL | Status: DC | PRN

## 2018-09-17 MED ORDER — FENTANYL CITRATE (PF) 100 MCG/2ML IJ SOLN
INTRAMUSCULAR | Status: DC | PRN
  Administered 2018-09-17: 100 ug via INTRAVENOUS
  Administered 2018-09-17: 50 ug via INTRAVENOUS

## 2018-09-17 MED ORDER — METHOCARBAMOL 500 MG PO TABS
500.0000 mg | ORAL_TABLET | Freq: Four times a day (QID) | ORAL | Status: DC | PRN
  Administered 2018-09-19: 500 mg via ORAL
  Filled 2018-09-17 (×2): qty 1

## 2018-09-17 MED ORDER — SODIUM CHLORIDE 0.9% FLUSH: 10.0000 mL | INTRAVENOUS | Status: DC | PRN

## 2018-09-17 MED ORDER — ACETAMINOPHEN 500 MG PO TABS: 1000.0000 mg | ORAL_TABLET | Freq: Once | ORAL | Status: DC | PRN

## 2018-09-17 MED ORDER — ACETAMINOPHEN 325 MG PO TABS: 325.0000 mg | ORAL_TABLET | Freq: Four times a day (QID) | ORAL | Status: DC | PRN

## 2018-09-17 MED ORDER — ACETAMINOPHEN 160 MG/5ML PO SOLN: 1000.0000 mg | Freq: Once | ORAL | Status: DC | PRN

## 2018-09-17 MED ORDER — VANCOMYCIN HCL IN DEXTROSE 1-5 GM/200ML-% IV SOLN
1000.0000 mg | Freq: Two times a day (BID) | INTRAVENOUS | Status: AC
  Administered 2018-09-17: 1000 mg via INTRAVENOUS
  Filled 2018-09-17 (×2): qty 200

## 2018-09-17 MED ORDER — HYDROCODONE-ACETAMINOPHEN 7.5-325 MG PO TABS: 1.0000 | ORAL_TABLET | Freq: Once | ORAL | Status: DC | PRN

## 2018-09-17 MED ORDER — PHENYLEPHRINE 40 MCG/ML (10ML) SYRINGE FOR IV PUSH (FOR BLOOD PRESSURE SUPPORT)
PREFILLED_SYRINGE | INTRAVENOUS | Status: AC
  Filled 2018-09-17: qty 10

## 2018-09-17 MED ORDER — FENTANYL CITRATE (PF) 100 MCG/2ML IJ SOLN
INTRAMUSCULAR | Status: AC
  Filled 2018-09-17: qty 2

## 2018-09-17 MED ORDER — 0.9 % SODIUM CHLORIDE (POUR BTL) OPTIME
TOPICAL | Status: DC | PRN
  Administered 2018-09-17: 1000 mL

## 2018-09-17 MED ORDER — ALUM & MAG HYDROXIDE-SIMETH 200-200-20 MG/5ML PO SUSP: 30.0000 mL | ORAL | Status: DC | PRN

## 2018-09-17 MED ORDER — METHOCARBAMOL 1000 MG/10ML IJ SOLN
500.0000 mg | Freq: Four times a day (QID) | INTRAVENOUS | Status: DC | PRN
  Filled 2018-09-17: qty 5

## 2018-09-17 MED ORDER — SODIUM CHLORIDE 0.9 % IV SOLN
INTRAVENOUS | Status: DC
  Administered 2018-09-17 – 2018-09-19 (×2): via INTRAVENOUS

## 2018-09-17 MED ORDER — FENTANYL CITRATE (PF) 250 MCG/5ML IJ SOLN
INTRAMUSCULAR | Status: AC
  Filled 2018-09-17: qty 5

## 2018-09-17 MED ORDER — PHENYLEPHRINE HCL (PRESSORS) 10 MG/ML IV SOLN
INTRAVENOUS | Status: DC | PRN
  Administered 2018-09-17 (×2): 120 ug via INTRAVENOUS
  Administered 2018-09-17: 80 ug via INTRAVENOUS
  Administered 2018-09-17: 200 ug via INTRAVENOUS
  Administered 2018-09-17: 120 ug via INTRAVENOUS
  Administered 2018-09-17 (×2): 80 ug via INTRAVENOUS
  Administered 2018-09-17: 120 ug via INTRAVENOUS

## 2018-09-17 MED ORDER — METOCLOPRAMIDE HCL 5 MG/ML IJ SOLN: 5.0000 mg | Freq: Three times a day (TID) | INTRAMUSCULAR | Status: DC | PRN

## 2018-09-17 MED ORDER — DOCUSATE SODIUM 100 MG PO CAPS
100.0000 mg | ORAL_CAPSULE | Freq: Two times a day (BID) | ORAL | Status: DC
  Administered 2018-09-17 – 2018-09-22 (×10): 100 mg via ORAL
  Filled 2018-09-17 (×10): qty 1

## 2018-09-17 MED ORDER — SODIUM CHLORIDE 0.9 % IV SOLN
INTRAVENOUS | Status: DC | PRN
  Administered 2018-09-17: 50 ug/min via INTRAVENOUS

## 2018-09-17 MED ORDER — SODIUM CHLORIDE 0.9 % IR SOLN
Status: DC | PRN
  Administered 2018-09-17: 3000 mL

## 2018-09-17 MED ORDER — VANCOMYCIN HCL 1000 MG IV SOLR
INTRAVENOUS | Status: DC | PRN
  Administered 2018-09-17: 1000 mg via TOPICAL

## 2018-09-17 MED ORDER — PROPOFOL 10 MG/ML IV BOLUS
INTRAVENOUS | Status: AC
  Filled 2018-09-17: qty 20

## 2018-09-17 MED ORDER — ONDANSETRON HCL 4 MG/2ML IJ SOLN
INTRAMUSCULAR | Status: DC | PRN
  Administered 2018-09-17: 4 mg via INTRAVENOUS

## 2018-09-17 MED ORDER — ROCURONIUM BROMIDE 50 MG/5ML IV SOSY
PREFILLED_SYRINGE | INTRAVENOUS | Status: DC | PRN
  Administered 2018-09-17: 50 mg via INTRAVENOUS

## 2018-09-17 MED ORDER — PROPOFOL 10 MG/ML IV BOLUS
INTRAVENOUS | Status: DC | PRN
  Administered 2018-09-17: 80 mg via INTRAVENOUS

## 2018-09-17 MED ORDER — ONDANSETRON HCL 4 MG/2ML IJ SOLN
4.0000 mg | Freq: Four times a day (QID) | INTRAMUSCULAR | Status: DC | PRN
  Administered 2018-09-21: 4 mg via INTRAVENOUS
  Filled 2018-09-17: qty 2

## 2018-09-17 SURGICAL SUPPLY — 60 items
ALTRX PINNACLE 10DEG 36X58 (Bone Implant) ×2 IMPLANT
ARTICULEZE HEAD 36 12 (Hips) ×2 IMPLANT
BENZOIN TINCTURE PRP APPL 2/3 (GAUZE/BANDAGES/DRESSINGS) ×2 IMPLANT
BLADE CLIPPER SURG (BLADE) IMPLANT
BLADE SAW SGTL 18X1.27X75 (BLADE) ×2 IMPLANT
COVER SURGICAL LIGHT HANDLE (MISCELLANEOUS) ×2 IMPLANT
COVER WAND RF STERILE (DRAPES) ×2 IMPLANT
CUP ACETAB 58MM ×2 IMPLANT
DRAPE C-ARM 42X72 X-RAY (DRAPES) ×2 IMPLANT
DRAPE INCISE IOBAN 66X45 STRL (DRAPES) ×2 IMPLANT
DRAPE STERI IOBAN 125X83 (DRAPES) ×2 IMPLANT
DRAPE U-SHAPE 47X51 STRL (DRAPES) ×6 IMPLANT
DRESSING AQUACEL AG SP 3.5X6 (GAUZE/BANDAGES/DRESSINGS) ×1 IMPLANT
DRSG AQUACEL AG ADV 3.5X10 (GAUZE/BANDAGES/DRESSINGS) ×2 IMPLANT
DRSG AQUACEL AG SP 3.5X6 (GAUZE/BANDAGES/DRESSINGS) ×2
DRSG VAC ATS SM SENSATRAC (GAUZE/BANDAGES/DRESSINGS) ×2 IMPLANT
DURAPREP 26ML APPLICATOR (WOUND CARE) ×2 IMPLANT
ELECT BLADE 4.0 EZ CLEAN MEGAD (MISCELLANEOUS) ×2
ELECT BLADE 6.5 EXT (BLADE) IMPLANT
ELECT REM PT RETURN 9FT ADLT (ELECTROSURGICAL) ×2
ELECTRODE BLDE 4.0 EZ CLN MEGD (MISCELLANEOUS) ×1 IMPLANT
ELECTRODE REM PT RTRN 9FT ADLT (ELECTROSURGICAL) ×1 IMPLANT
EVACUATOR 1/8 PVC DRAIN (DRAIN) ×4 IMPLANT
FACESHIELD WRAPAROUND (MASK) ×4 IMPLANT
GLOVE BIOGEL PI IND STRL 8 (GLOVE) ×2 IMPLANT
GLOVE BIOGEL PI INDICATOR 8 (GLOVE) ×2
GLOVE ECLIPSE 8.0 STRL XLNG CF (GLOVE) ×2 IMPLANT
GLOVE ORTHO TXT STRL SZ7.5 (GLOVE) ×4 IMPLANT
GOWN STRL REUS W/ TWL LRG LVL3 (GOWN DISPOSABLE) ×2 IMPLANT
GOWN STRL REUS W/ TWL XL LVL3 (GOWN DISPOSABLE) ×2 IMPLANT
GOWN STRL REUS W/TWL LRG LVL3 (GOWN DISPOSABLE) ×2
GOWN STRL REUS W/TWL XL LVL3 (GOWN DISPOSABLE) ×2
HANDPIECE INTERPULSE COAX TIP (DISPOSABLE) ×1
HEAD ARTICULEZE 36 12 (Hips) ×1 IMPLANT
KIT BASIN OR (CUSTOM PROCEDURE TRAY) ×2 IMPLANT
KIT TURNOVER KIT B (KITS) ×2 IMPLANT
MANIFOLD NEPTUNE II (INSTRUMENTS) ×2 IMPLANT
NS IRRIG 1000ML POUR BTL (IV SOLUTION) ×2 IMPLANT
PACK TOTAL JOINT (CUSTOM PROCEDURE TRAY) ×2 IMPLANT
PAD ARMBOARD 7.5X6 YLW CONV (MISCELLANEOUS) ×2 IMPLANT
SCREW 6.5MMX25MM (Screw) ×6 IMPLANT
SCREW 6.5MMX30MM (Screw) ×2 IMPLANT
SCREW PINN CAN 6.5X20 (Screw) ×2 IMPLANT
SET HNDPC FAN SPRY TIP SCT (DISPOSABLE) ×1 IMPLANT
STAPLER VISISTAT 35W (STAPLE) IMPLANT
STRIP CLOSURE SKIN 1/2X4 (GAUZE/BANDAGES/DRESSINGS) ×4 IMPLANT
SUT ETHIBOND NAB CT1 #1 30IN (SUTURE) ×2 IMPLANT
SUT MNCRL AB 4-0 PS2 18 (SUTURE) IMPLANT
SUT VIC AB 0 CT1 27 (SUTURE) ×1
SUT VIC AB 0 CT1 27XBRD ANBCTR (SUTURE) ×1 IMPLANT
SUT VIC AB 1 CT1 27 (SUTURE) ×1
SUT VIC AB 1 CT1 27XBRD ANBCTR (SUTURE) ×1 IMPLANT
SUT VIC AB 2-0 CT1 27 (SUTURE) ×1
SUT VIC AB 2-0 CT1 TAPERPNT 27 (SUTURE) ×1 IMPLANT
TOWEL OR 17X24 6PK STRL BLUE (TOWEL DISPOSABLE) ×2 IMPLANT
TOWEL OR 17X26 10 PK STRL BLUE (TOWEL DISPOSABLE) ×2 IMPLANT
TRAY CATH 16FR W/PLASTIC CATH (SET/KITS/TRAYS/PACK) IMPLANT
TRAY FOLEY W/BAG SLVR 16FR (SET/KITS/TRAYS/PACK)
TRAY FOLEY W/BAG SLVR 16FR ST (SET/KITS/TRAYS/PACK) IMPLANT
WATER STERILE IRR 1000ML POUR (IV SOLUTION) ×4 IMPLANT

## 2018-09-17 NOTE — Anesthesia Procedure Notes (Signed)
Procedure Name: Intubation Date/Time: 09/17/2018 10:34 AM Performed by: Inda Coke, CRNA Pre-anesthesia Checklist: Patient identified, Emergency Drugs available, Suction available and Patient being monitored Patient Re-evaluated:Patient Re-evaluated prior to induction Oxygen Delivery Method: Circle System Utilized Preoxygenation: Pre-oxygenation with 100% oxygen Induction Type: IV induction and Rapid sequence Laryngoscope Size: Mac and 4 Grade View: Grade I Tube type: Oral Tube size: 7.5 mm Number of attempts: 1 Airway Equipment and Method: Stylet and Oral airway Placement Confirmation: ETT inserted through vocal cords under direct vision,  positive ETCO2 and breath sounds checked- equal and bilateral Secured at: 23 cm Tube secured with: Tape Dental Injury: Teeth and Oropharynx as per pre-operative assessment

## 2018-09-17 NOTE — Evaluation (Signed)
Physical Therapy Evaluation Patient Details Name: [name] MRN: [id number] DOB: 24-Dec-1950 Today's Date: 09/17/2018   History of Present Illness  68 y.o. male, has a history of pain and functional disability in the left hip(s) due to avascular necrosis with femoral head collapse. Original L THA 4/3, Pt dislocated 4/5 requiring closed reduction. L THA revised 4/7, Discharged home where he L hip dislocated again, brough back for closed reduction 4/18. Discharged to SNF, where hip dislocated again 5/6 , s/p 5/8 L THA revision.   Clinical Impression  Pt known to therapist from initial L THA. Pt from [facility] where he was ambulating 120 feet with RW and required assist for LE dressing. Pt is immediately s/p L THA this afternoon, and is currently very concerned about the lack of coordination he has in his UEs as he has grip strength he is unable to perform finger to nose with less coordination on L than R. Did not attempt coordination of LE due to hip precautions. In terms of mobility, pt is currently modA for bed mobility and min A for transfers and lateral stepping of 3 feet toward HoB. Pt agreeable to SNF level rehab but refuses to return to [facility]. PT will continue to follow acutely.     Follow Up Recommendations Follow surgeon's recommendation for DC plan and follow-up therapies    Equipment Recommendations  None recommended by PT    Recommendations for Other Services       Precautions / Restrictions Precautions Precautions: Posterior Hip Precaution Comments: able to restate posterior hip precautions  Required Braces or Orthoses: Knee Immobilizer - Left Knee Immobilizer - Left: On at all times Restrictions Weight Bearing Restrictions: Yes LLE Weight Bearing: Weight bearing as tolerated      Mobility  Bed Mobility Overal bed mobility: Needs Assistance Bed Mobility: Supine to Sit;Sit to Supine     Supine to sit: Min assist;Mod assist Sit to supine: Mod assist    General bed mobility comments: min A to push on railing to come to upright modA required to scoot hips to EoB due to decreased coordination with UE, modA for management of LE back into bed  Transfers Overall transfer level: Needs assistance Equipment used: Rolling walker (2 wheeled) Transfers: Sit to/from Stand Sit to Stand: Min assist;From elevated surface         General transfer comment: min A to stand from elevated bed surface  Ambulation/Gait Ambulation/Gait assistance: Min assist Gait Distance (Feet): 3 Feet Assistive device: Rolling walker (2 wheeled) Gait Pattern/deviations: Step-to pattern;Trunk flexed;Narrow base of support;Shuffle Gait velocity: decreased Gait velocity interpretation: <1.31 ft/sec, indicative of household ambulator General Gait Details: lateral stepping up toward HoB, very small steps with decreased ability to use UE to offweight R LE for movement     Balance Overall balance assessment: Needs assistance Sitting-balance support: Feet supported;No upper extremity supported Sitting balance-[name]: Fair     Standing balance support: Bilateral upper extremity supported Standing balance-[name]: Poor                               Pertinent Vitals/Pain Pain Assessment: Faces Faces Pain Scale: Hurts little more Pain Location: L hip Pain Descriptors / Indicators: Sore;Throbbing;Discomfort Pain Intervention(s): Limited activity within patient's tolerance;Monitored during session;Repositioned    Home Living Family/patient expects to be discharged to:: Skilled nursing facility  Prior Function Level of Independence: Needs assistance   Gait / Transfers Assistance Needed: reports ambulation with RW of 120 feet at SNF  ADL's / Homemaking Assistance Needed: assist for dressing L LE        Hand Dominance   Dominant Hand: Right    Extremity/Trunk Assessment   Upper Extremity Assessment Upper  Extremity Assessment: RUE deficits/detail;LUE deficits/detail RUE Deficits / Details: decreased AROM in shoulder, elbow flex/ext limited at full range, finger flexion/extension limited at end range. Strength grossly assessed 3+/5  RUE Coordination: decreased fine motor;decreased gross motor(unable to complete finger to nose) LUE Deficits / Details: decreased AROM in L shoulder, elbow flex/ext limited at full range, finger flexion/extension limited at end range. Strength grossly assessed 3+/5  LUE Coordination: decreased fine motor;decreased gross motor(unable to complete finger to nose)    Lower Extremity Assessment Lower Extremity Assessment: LLE deficits/detail LLE Deficits / Details: L hip and knee ROM limited by surgical pain, ankle WFL LLE: Unable to fully assess due to pain;Unable to fully assess due to immobilization LLE Sensation: WNL(in foot)       Communication   Communication: No difficulties  Cognition Arousal/Alertness: Awake/alert Behavior During Therapy: WFL for tasks assessed/performed Overall Cognitive Status: Within Functional Limits for tasks assessed                                        General Comments General comments (skin integrity, edema, etc.): Two drains and wound vac in place, moderate drainage in drains     Assessment/Plan    PT Assessment Patient needs continued PT services  PT Problem List Decreased strength;Decreased range of motion;Decreased activity tolerance;Decreased balance;Decreased mobility;Decreased coordination;Pain;Decreased knowledge of use of DME       PT Treatment Interventions DME instruction;Gait training;Stair training;Functional mobility training;Therapeutic activities;Therapeutic exercise;Balance training;Patient/family education;Cognitive remediation    PT Goals (Current goals can be found in the Care Plan section)  Acute Rehab PT Goals Patient Stated Goal: get back to his dog and life PT Goal Formulation:  With patient Time For Goal Achievement: 10/01/18 Potential to Achieve Goals: Fair    Frequency Min 5X/week    AM-PAC PT "6 Clicks" Mobility  Outcome Measure Help needed turning from your back to your side while in a flat bed without using bedrails?: A Little Help needed moving from lying on your back to sitting on the side of a flat bed without using bedrails?: A Lot Help needed moving to and from a bed to a chair (including a wheelchair)?: A Lot Help needed standing up from a chair using your arms (e.g., wheelchair or bedside chair)?: A Lot Help needed to walk in hospital room?: A Lot Help needed climbing 3-5 steps with a railing? : Total 6 Click Score: 12    End of Session Equipment Utilized During Treatment: Gait belt Activity Tolerance: Patient limited by pain Patient left: in bed;with call bell/phone within reach;with bed alarm set Nurse Communication: Mobility status;Precautions;Weight bearing status PT Visit Diagnosis: Unsteadiness on feet (R26.81);Other abnormalities of gait and mobility (R26.89);Muscle weakness (generalized) (M62.81);Difficulty in walking, not elsewhere classified (R26.2);Pain Pain - Right/Left: Left Pain - part of body: Hip    Time: 1545-1620 PT Time Calculation (min) (ACUTE ONLY): 35 min   Charges:   PT Evaluation $PT Eval Moderate Complexity: 1 Mod PT Treatments $Gait Training: 8-22 mins        [name] B. Migdalia Dk PT, DPT  Acute Rehabilitation Services Pager [phone] Office [phone]   [place] 09/17/2018, 4:56 PM

## 2018-09-17 NOTE — Anesthesia Preprocedure Evaluation (Addendum)
Anesthesia Evaluation  Patient identified by MRN, date of birth, ID band Patient awake    Reviewed: Allergy & Precautions, NPO status , Patient's Chart, lab work & pertinent test results  History of Anesthesia Complications (+) PONV and history of anesthetic complications  Airway Mallampati: II  TM Distance: >3 FB Neck ROM: Full    Dental  (+) Upper Dentures, Lower Dentures   Pulmonary pneumonia, resolved,  Pulmonary fibrosis   breath sounds clear to auscultation       Cardiovascular hypertension, + CAD, + Past MI and + CABG   Rhythm:Regular     Neuro/Psych PSYCHIATRIC DISORDERS Anxiety Depression TIA Neuromuscular disease CVA    GI/Hepatic hiatal hernia, PUD, GERD  Poorly Controlled,(+) Hepatitis -, A  Endo/Other    Renal/GU negative Renal ROS     Musculoskeletal  (+) Arthritis , Rheumatoid disorders,  Fibromyalgia -  Abdominal   Peds  Hematology  (+) HIV,   Anesthesia Other Findings   Reproductive/Obstetrics                          Anesthesia Physical Anesthesia Plan  ASA: III  Anesthesia Plan: General   Post-op Pain Management:    Induction: Intravenous  PONV Risk Score and Plan: 3 and Dexamethasone and Ondansetron  Airway Management Planned: Oral ETT  Additional Equipment: None  Intra-op Plan:   Post-operative Plan: Extubation in OR  Informed Consent: I have reviewed the patients History and Physical, chart, labs and discussed the procedure including the risks, benefits and alternatives for the proposed anesthesia with the patient or authorized representative who has indicated his/her understanding and acceptance.     Dental advisory given  Plan Discussed with: CRNA and Anesthesiologist  Anesthesia Plan Comments:       Anesthesia Quick Evaluation

## 2018-09-17 NOTE — Op Note (Signed)
NAME: [name], [name]. MEDICAL RECORD MW:[id number] ACCOUNT [id number] DATE OF BIRTH:Jun 25, 1950 FACILITY: [facility] LOCATION: [facility] PHYSICIAN:Cordaro Mukai [place], MD  OPERATIVE REPORT  DATE OF PROCEDURE:  09/17/2018  PREOPERATIVE DIAGNOSIS:  Unstable left total hip arthroplasty with recurrent posterior dislocations.  POSTOPERATIVE DIAGNOSIS:  Unstable left total hip arthroplasty with recurrent posterior dislocations.  PROCEDURE:  Left hip revision arthroplasty with revision of the acetabular component only.  IMPLANTS: 1.  DePuy Sector Gription multi-hole acetabular component size 58 with 5 screws, 10-degree lip liner oriented posteriorly. 2.  A 36+12 metal hip ball.  SURGEON:  Lind Guest. Ninfa Linden, MD  ASSISTANT:  Erskine Emery, PA-C  ANESTHESIA:  General.  ANTIBIOTICS:  900 mg IV clindamycin.  ESTIMATED BLOOD LOSS:  700 mL.  COMPLICATIONS:  None.  INDICATIONS:  The patient is a 68 year old gentleman well known to me.  He has a complicated history in terms of he developed significant and severe avascular necrosis of his left hip and then sustained acute collapse of the femoral head.  He is also  someone with a spinal fusion with the spine fused all the way to the pelvis, and he walks in such a leaned-over and hunched-over position with severe kyphosis.  His pelvis also tends to orient itself posteriorly, and so we felt that an anterior hip  replacement would be successful.  He was taken to the operating room over a month ago, and we performed a hip replacement, but he overcame our previous anteverted position of the acetabular component and dislocated 2 days postoperatively.  I took him  back to the operating room and actually went with a higher offset femoral component, a longer hip ball, and a different acetabular component with more anteversion in it.  We had him in a knee immobilizer and a hip abduction placed, and he still ended up  dislocating.  I have made several  different measurements off his pelvis knowing that he has a small window of making the hip unstable too much anteriorly if we antevert him much more.  We elected to take him back to the operating room for revising the  acetabular component, and this time anteverted it even more so and placed a 10-degree lip liner in a posterior oriented position.  I explained this to him in detail including a long and thorough discussion about the risks and benefits of infection.  The  biggest risks are implant failure with recurrent dislocations and acute blood loss anemia as well as infection, having been in his hip multiple times.  My biggest fear is that he is going to end up needing a constrained liner, but usually do not want to  do this in an acute setting such as this.  DESCRIPTION OF PROCEDURE:  After informed consent was obtained and appropriate left hip was marked, he was brought to the operating room.  General anesthesia was obtained while he was on a stretcher.  Traction boots were placed on both his feet.  Next,  he was placed supine on the Hana fracture table with the perineal post in place and both legs in in-line skeletal traction, but no traction applied.  We assessed his hip radiographically so we could get a good idea of where his version is on that hip in  terms of the acetabular component.  We then had the left hip prepped and draped with DuraPrep and sterile drapes.  A time-out was called, and he was identified as correct patient, correct left hip.  We then went through his original  incision and  dissected down to a large hematoma due to the blood thinners he has been on, as well as significant seroma.  We were able to dissect down to the hip joint, and he was already in a reduced position.  We were able to pull traction, externally rotate, and  dislocate the hip.  I assessed the components for loosening and did not find any evidence of loosening of the acetabular or femoral component.  The femoral  component was in a touch of anteversion where we would like him to be.  We then removed the  previous hip ball but left the femoral component in place where there was no evidence of loosening.  We then removed the previous polyethylene insert and removed the screws from the acetabulum, which were 2 screws.  The acetabulum had a good fit to it,  so we had to meticulously remove it with osteotomes and cutting device.  Once we got it removed, we then reamed once with a size 55 reamer, and we chose a multi-hole size 58 acetabular component that we then placed under direct fluoroscopy and direct  visualization, dialing in much more anteversion than what we had previously.  Once we were pleased with the anteverted position, we placed 5 screws and several of the screw holes to get good security of the cup.  We then chose our 10-degree lip liner,  and I put this in about the 4 o'clock position which oriented where he was in the posterior aspect of the acetabular component.  We then decided to even lengthen it more and went with a +12 metal hip ball.  We then easily reduced this in the acetabulum.   I am glad we did not antevert him more because he was significantly opened up.  We took his foot out of the stirrup so I could really flex his hip up and internally rotate him, and we could not get him to dislocate posteriorly at all.  We then put him  through external rotation to make sure that he was not going to come out the front, which he did not, just past 90 degrees of external rotation.  We then put the leg back in the stirrup and irrigated the soft tissue with normal saline solution.  We again  assessed the hip radiographically as well.  We then closed the deep tissue with #1 Ethibond suture after placing a deep medium Hemovac drain.  We also placed superficially in the tensor fascia another medium Hemovac drain.  We then excised some of his  other previous incision and meticulously closed them from deep to  superficial with 0 Vicryl followed by 2-0 Vicryl, interrupted 2-0 nylon on the skin.  An incisional VAC was placed as well anticipating that he will have drainage.  It was set to -125 mm  of continuous suction.  The medium Hemovacs x2 were also charged.  He was then placed back in a knee immobilizer, taken off the Hana table, awakened, extubated, and taken to recovery room in stable condition.  All final counts were correct.  There were  no complications noted.  Of note, Benita Stabile, PA-C, assisted the entire case.  His assistance was crucial for facilitating all aspects of this case.  LN/NUANCE  D:09/17/2018 T:09/17/2018 JOB:[id number]

## 2018-09-17 NOTE — Brief Op Note (Signed)
09/15/2018 - 09/17/2018  1:07 PM  PATIENT:  [name]  68 y.o. male  PRE-OPERATIVE DIAGNOSIS:  recurrent dislocation left prosthetic hip  POST-OPERATIVE DIAGNOSIS:  recurrent dislocation left prosthetic hip  PROCEDURE:  Procedure(s): ANTERIOR LEFT HIP REVISION (Left)  SURGEON:  Surgeon(s) and Role:    Mcarthur Rossetti, MD - Primary  PHYSICIAN ASSISTANT: Benita Stabile, PA-C  ANESTHESIA:   general  EBL:  700 mL   COUNTS:  YES  DICTATION: .Other Dictation: Dictation Number [id number]  PLAN OF CARE: Admit to inpatient   PATIENT DISPOSITION:  PACU - hemodynamically stable.   Delay start of Pharmacological VTE agent (>24hrs) due to surgical blood loss or risk of bleeding: no

## 2018-09-17 NOTE — Transfer of Care (Signed)
Immediate Anesthesia Transfer of Care Note  Patient: [name]  Procedure(s) Performed: ANTERIOR LEFT HIP REVISION (Left Hip)  Patient Location: PACU  Anesthesia Type:General  Level of Consciousness: patient cooperative and responds to stimulation  Airway & Oxygen Therapy: Patient Spontanous Breathing and Patient connected to nasal cannula oxygen  Post-op Assessment: Report given to RN and Post -op Vital signs reviewed and stable  Post vital signs: Reviewed and stable  Last Vitals:  Vitals Value Taken Time  BP 118/59 09/17/2018  1:31 PM  Temp    Pulse 99 09/17/2018  1:32 PM  Resp 30 09/17/2018  1:32 PM  SpO2 96 % 09/17/2018  1:32 PM  Vitals shown include unvalidated device data.  Last Pain:  Vitals:   09/17/18 0919  TempSrc: Oral  PainSc:       Patients Stated Pain Goal: 2 (76/28/31 5176)  Complications: No apparent anesthesia complications

## 2018-09-17 NOTE — Plan of Care (Signed)
  Problem: Pain Managment: Goal: General experience of comfort will improve Outcome: Progressing   Problem: Safety: Goal: Ability to remain free from injury will improve Outcome: Progressing   

## 2018-09-17 NOTE — Progress Notes (Signed)
Patient ID: [name], male   DOB: [birth year], 68 y.o.   MRN: [id number] The patient understands fully that we are proceeding to the OR today for a revision of his left total hip due to recurrent instability with posterior dislocations.  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  Hopefully, placing a new cup in even more antiversion combined with a lipped liner will take care of the instability.  The risks and benefits of surgery have been discussed in detail and informed consent is obtained.

## 2018-09-18 LAB — CBC
HCT: 23.1 % — ABNORMAL LOW (ref 39.0–52.0)
Hemoglobin: 7.2 g/dL — ABNORMAL LOW (ref 13.0–17.0)
MCH: 32.4 pg (ref 26.0–34.0)
MCHC: 31.2 g/dL (ref 30.0–36.0)
MCV: 104.1 fL — ABNORMAL HIGH (ref 80.0–100.0)
Platelets: 128 10*3/uL — ABNORMAL LOW (ref 150–400)
RBC: 2.22 MIL/uL — ABNORMAL LOW (ref 4.22–5.81)
RDW: 16 % — ABNORMAL HIGH (ref 11.5–15.5)
WBC: 8.3 10*3/uL (ref 4.0–10.5)
nRBC: 0 % (ref 0.0–0.2)

## 2018-09-18 LAB — PREPARE RBC (CROSSMATCH)

## 2018-09-18 LAB — BASIC METABOLIC PANEL
Anion gap: 10 (ref 5–15)
BUN: 8 mg/dL (ref 8–23)
CO2: 29 mmol/L (ref 22–32)
Calcium: 7.2 mg/dL — ABNORMAL LOW (ref 8.9–10.3)
Chloride: 96 mmol/L — ABNORMAL LOW (ref 98–111)
Creatinine, Ser: 0.63 mg/dL (ref 0.61–1.24)
GFR calc Af Amer: >60 mL/min (ref >60)
GFR calc non Af Amer: >60 mL/min (ref >60)
Glucose, Bld: 140 mg/dL — ABNORMAL HIGH (ref 70–99)
Potassium: 3.5 mmol/L (ref 3.5–5.1)
Sodium: 135 mmol/L (ref 135–145)

## 2018-09-18 MED ORDER — EDOXABAN TOSYLATE 30 MG PO TABS
30.0000 mg | ORAL_TABLET | ORAL | Status: DC
  Administered 2018-09-18 – 2018-09-22 (×5): 30 mg via ORAL
  Filled 2018-09-18 (×5): qty 1

## 2018-09-18 MED ORDER — SODIUM CHLORIDE 0.9% IV SOLUTION
Freq: Once | INTRAVENOUS | Status: AC
  Administered 2018-09-18: 10:00:00 via INTRAVENOUS

## 2018-09-18 MED ORDER — FUROSEMIDE 10 MG/ML IJ SOLN
20.0000 mg | Freq: Once | INTRAMUSCULAR | Status: AC
  Administered 2018-09-18: 20 mg via INTRAVENOUS
  Filled 2018-09-18: qty 2

## 2018-09-18 NOTE — Progress Notes (Signed)
Patient ID: [name], male   DOB: 1950/05/30, 68 y.o.   MRN: [id number] The patient tolerated surgery well yesterday to revise his acetabular component and place it more anteversion as well as supplemented that with a 10 degree lipped liner.  I have him in a knee immobilizer as well.  He does have acute blood loss anemia from his surgery.  My plan is to transfuse him with some packed red blood cells today.  He has 2 Hemovac drains in as well as an incisional VAC over closed incision.  This is in an effort to completely dry the wound.  He can be up with physical therapy with weightbearing as tolerated on the left lower extremity.  He needs to stay in the knee immobilizer.  I will therapy to work on with him getting into a chair with keeping him as upright as possible in terms of his spine and not having him lean over so far due to the posterior directed position of his pelvis from his spine to pelvis fusion.  Strict posterior hip precautions should be adhered to.  I would also not have him externally rotate his left leg significantly at all with mainly having his toes pointed forward.  He is complaining of upper extremity weakness.  He can hold his arms above his head.  He does have hand numbness as well which is becoming a chronic thing for him.  I will start him back on his blood thinning medication side today.  I anticipate him being here for early next week.

## 2018-09-18 NOTE — Progress Notes (Signed)
MD Marlou Sa made aware of pt. hemovac leaking. Verbal orders given to change dressing around hemovac PRN. MD stated they will look at it again tomorrow morning. Will continue to monitor patient.

## 2018-09-18 NOTE — Progress Notes (Signed)
Physical Therapy Treatment Patient Details Name: [name] MRN: [id number] DOB: 24-Nov-1950 Today's Date: 09/18/2018    History of Present Illness Pt is a 68 y.o. male with PMH of pain and functional disability in the left hip(s) due to avascular necrosis with femoral head collapse. Original L THA 4/3, Pt dislocated 4/5 requiring closed reduction. L THA revised 4/7, Discharged home where he L hip dislocated again, brough back for closed reduction 4/18. Discharged to SNF, where hip dislocated again 5/6 , s/p 5/8 L THA revision.     PT Comments    Pt making steady progress with functional mobility. Remains limited secondary to pain and weakness. Of note, one of pt's hemovac drains became disconnected (NOT pulled out completely) during session when sitting EOB. Bleeding noted from drain entry site and pressure applied. RN was notified immediately and entered room to redress. Pt would continue to benefit from skilled physical therapy services at this time while admitted and after d/c to address the below listed limitations in order to improve overall safety and independence with functional mobility.     Follow Up Recommendations  SNF     Equipment Recommendations  None recommended by PT    Recommendations for Other Services       Precautions / Restrictions Precautions Precautions: Posterior Hip Required Braces or Orthoses: Knee Immobilizer - Left Knee Immobilizer - Left: On at all times Restrictions Weight Bearing Restrictions: Yes LLE Weight Bearing: Weight bearing as tolerated    Mobility  Bed Mobility Overal bed mobility: Needs Assistance Bed Mobility: Supine to Sit     Supine to sit: Min guard     General bed mobility comments: increased time and effort, use of bilateral UEs to move L LE off of bed, min guard for safety  Transfers Overall transfer level: Needs assistance Equipment used: Rolling walker (2 wheeled) Transfers: Sit to/from [employer] Sit to Stand: Min assist;From elevated surface Stand pivot transfers: Min assist       General transfer comment: min A to stand from elevated bed surface; cueing for safe hand placement needed; min A for stability with pivotal movement to chair; cueing and physical assistance with L LE to move forwards to prevent hip flexion past precautions with sitting  Ambulation/Gait                 Stairs             Wheelchair Mobility    Modified Rankin (Stroke Patients Only)       Balance Overall balance assessment: Needs assistance Sitting-balance support: Feet supported;No upper extremity supported Sitting balance-[name]: Fair     Standing balance support: Bilateral upper extremity supported Standing balance-[name]: Poor                              Cognition Arousal/Alertness: Awake/alert Behavior During Therapy: WFL for tasks assessed/performed Overall Cognitive Status: Within Functional Limits for tasks assessed                                        Exercises      General Comments        Pertinent Vitals/Pain Pain Assessment: Faces Faces Pain Scale: Hurts little more Pain Location: L hip Pain Descriptors / Indicators: Sore;Throbbing;Discomfort Pain Intervention(s): Monitored during session;Repositioned    Home Living  Prior Function            PT Goals (current goals can now be found in the care plan section) Acute Rehab PT Goals PT Goal Formulation: With patient Time For Goal Achievement: 10/01/18 Potential to Achieve Goals: Fair Progress towards PT goals: Progressing toward goals    Frequency    Min 5X/week      PT Plan Current plan remains appropriate    Co-evaluation              AM-PAC PT "6 Clicks" Mobility   Outcome Measure  Help needed turning from your back to your side while in a flat bed without using bedrails?: A Little Help needed  moving from lying on your back to sitting on the side of a flat bed without using bedrails?: A Little Help needed moving to and from a bed to a chair (including a wheelchair)?: A Little Help needed standing up from a chair using your arms (e.g., wheelchair or bedside chair)?: A Little Help needed to walk in hospital room?: A Lot Help needed climbing 3-5 steps with a railing? : Total 6 Click Score: 15    End of Session Equipment Utilized During Treatment: Gait belt Activity Tolerance: Patient limited by fatigue;Patient limited by pain Patient left: in chair;with call bell/phone within reach Nurse Communication: Mobility status PT Visit Diagnosis: Other abnormalities of gait and mobility (R26.89);Pain Pain - Right/Left: Left Pain - part of body: Hip     Time: 8016-5537 PT Time Calculation (min) (ACUTE ONLY): 30 min  Charges:  $Therapeutic Activity: 23-37 mins                     Sherie Don, PT, DPT  Acute Rehabilitation Services Pager [phone] Office [place] 09/18/2018, 3:15 PM

## 2018-09-19 LAB — TYPE AND SCREEN
ABO/RH(D): A POS
Antibody Screen: NEGATIVE
Unit division: 0

## 2018-09-19 LAB — BPAM RBC
Blood Product Expiration Date: 202005162359
ISSUE DATE / TIME: 202005090917
Unit Type and Rh: 6200

## 2018-09-19 LAB — CBC
HCT: 25.4 % — ABNORMAL LOW (ref 39.0–52.0)
Hemoglobin: 8.1 g/dL — ABNORMAL LOW (ref 13.0–17.0)
MCH: 32.1 pg (ref 26.0–34.0)
MCHC: 31.9 g/dL (ref 30.0–36.0)
MCV: 100.8 fL — ABNORMAL HIGH (ref 80.0–100.0)
Platelets: 131 10*3/uL — ABNORMAL LOW (ref 150–400)
RBC: 2.52 MIL/uL — ABNORMAL LOW (ref 4.22–5.81)
RDW: 17.2 % — ABNORMAL HIGH (ref 11.5–15.5)
WBC: 8.9 10*3/uL (ref 4.0–10.5)
nRBC: 0 % (ref 0.0–0.2)

## 2018-09-19 NOTE — Progress Notes (Signed)
Physical Therapy Treatment Patient Details Name: [name] MRN: [id number] DOB: 16-Jul-1950 Today's Date: 09/19/2018    History of Present Illness Pt is a 68 y.o. male with PMH of pain and functional disability in the left hip(s) due to avascular necrosis with femoral head collapse. Original L THA 4/3, Pt dislocated 4/5 requiring closed reduction. L THA revised 4/7, Discharged home where he L hip dislocated again, brough back for closed reduction 4/18. Discharged to SNF, where hip dislocated again 5/6 , s/p 5/8 L THA revision.     PT Comments    Patient progressing slowly towards PT goals. Able to state hip precautions but requires max cues to adhere to them during functional mobility. Tolerated gait training today with mod A for balance/safety and to assist with LLE progression when fatigued. BLE weakness noted. Fatigues quickly. Needs elevated surface to stand from to prevent increased hip flexion. Education re: importance of precautions, mobility expectations, positioning etc. Will continue to follow and progress.    Follow Up Recommendations  SNF     Equipment Recommendations  None recommended by PT    Recommendations for Other Services       Precautions / Restrictions Precautions Precautions: Posterior Hip Precaution Booklet Issued: Yes (comment) Precaution Comments: able to restate posterior hip precautions  Required Braces or Orthoses: Knee Immobilizer - Left Knee Immobilizer - Left: On at all times Restrictions Weight Bearing Restrictions: Yes LLE Weight Bearing: Weight bearing as tolerated    Mobility  Bed Mobility Overal bed mobility: Needs Assistance Bed Mobility: Supine to Sit     Supine to sit: Min guard     General bed mobility comments: increased time and effort, use of bilateral UEs to move L LE off of bed, min guard for safety and lines.  Transfers Overall transfer level: Needs assistance Equipment used: Rolling walker (2 wheeled) Transfers: Sit  to/from Stand Sit to Stand: Min assist;From elevated surface         General transfer comment: min A to stand from elevated bed surface; cueing for safe hand placement needed; cues for technique to maintain precautions upon standing.  Ambulation/Gait Ambulation/Gait assistance: Mod assist Gait Distance (Feet): 16 Feet Assistive device: Rolling walker (2 wheeled) Gait Pattern/deviations: Step-to pattern;Trunk flexed;Narrow base of support;Step-through pattern Gait velocity: decreased   General Gait Details: Slow, unsteady gait with narrow BoS and assist needed to advance LLE when fatigued. Bil knee instability noted, increased WB through BUEs. Needed cues to adhere to precautions during turns.   Stairs             Wheelchair Mobility    Modified Rankin (Stroke Patients Only)       Balance Overall balance assessment: Needs assistance Sitting-balance support: Feet supported;No upper extremity supported Sitting balance-[name]: Fair     Standing balance support: Bilateral upper extremity supported;During functional activity Standing balance-[name]: Poor Standing balance comment: Requires BUE support in standing.                            Cognition Arousal/Alertness: Awake/alert Behavior During Therapy: WFL for tasks assessed/performed Overall Cognitive Status: Within Functional Limits for tasks assessed                                        Exercises      General Comments General comments (skin integrity, edema, etc.): Twi  JP drains and wound vac in place      Pertinent Vitals/Pain Pain Assessment: Faces Faces Pain Scale: Hurts little more Pain Location: Lft hip Pain Descriptors / Indicators: Sore;Discomfort Pain Intervention(s): Monitored during session;Repositioned    Home Living                      Prior Function            PT Goals (current goals can now be found in the care plan section) Progress  towards PT goals: Progressing toward goals    Frequency    Min 5X/week      PT Plan Current plan remains appropriate    Co-evaluation              AM-PAC PT "6 Clicks" Mobility   Outcome Measure  Help needed turning from your back to your side while in a flat bed without using bedrails?: A Little Help needed moving from lying on your back to sitting on the side of a flat bed without using bedrails?: A Little Help needed moving to and from a bed to a chair (including a wheelchair)?: A Little Help needed standing up from a chair using your arms (e.g., wheelchair or bedside chair)?: A Lot Help needed to walk in hospital room?: A Lot Help needed climbing 3-5 steps with a railing? : Total 6 Click Score: 14    End of Session Equipment Utilized During Treatment: Gait belt;Left knee immobilizer Activity Tolerance: Patient limited by fatigue Patient left: in chair;with call bell/phone within reach Nurse Communication: Mobility status PT Visit Diagnosis: Other abnormalities of gait and mobility (R26.89);Pain Pain - Right/Left: Left Pain - part of body: Hip     Time: 6301-6010 PT Time Calculation (min) (ACUTE ONLY): 42 min  Charges:  $Gait Training: 8-22 mins $Therapeutic Activity: 8-22 mins $Self Care/Home Management: 8-22                     Wray Kearns, PT, DPT Acute Rehabilitation Services Pager [phone] Office [place] 09/19/2018, 3:47 PM

## 2018-09-19 NOTE — Anesthesia Postprocedure Evaluation (Signed)
Anesthesia Post Note  Patient: [name]  Procedure(s) Performed: ANTERIOR LEFT HIP REVISION (Left Hip)     Patient location during evaluation: PACU Anesthesia Type: General Level of consciousness: awake and alert Pain management: pain level controlled Vital Signs Assessment: post-procedure vital signs reviewed and stable Respiratory status: spontaneous breathing, nonlabored ventilation, respiratory function stable and patient connected to nasal cannula oxygen Cardiovascular status: blood pressure returned to baseline and stable Postop Assessment: no apparent nausea or vomiting Anesthetic complications: no    Last Vitals:  Vitals:   09/18/18 2030 09/19/18 0446  BP: 105/64 119/72  Pulse: 95 84  Resp:    Temp: 36.7 C 36.7 C  SpO2: 94% 96%    Last Pain:  Vitals:   09/19/18 0446  TempSrc: Oral  PainSc:                  Niya Behler

## 2018-09-19 NOTE — Progress Notes (Signed)
Subjective: 2 Days Post-Op Procedure(s) (LRB): ANTERIOR LEFT HIP REVISION (Left) Patient reports pain as moderate.  Worked with therapy yesterday.  Hgb only up to 8.1 with transfusion.  Objective: Vital signs in last 24 hours: Temp:  [98 F (36.7 C)-98.4 F (36.9 C)] 98.1 F (36.7 C) (05/10 0446) Pulse Rate:  [84-98] 84 (05/10 0446) Resp:  [18] 18 (05/09 1252) BP: (105-135)/(64-84) 119/72 (05/10 0446) SpO2:  [94 %-100 %] 96 % (05/10 0446)  Intake/Output from previous day: 05/09 0701 - 05/10 0700 In: 2684.5 [P.O.:834; I.V.:1590.5; Blood:260] Out: 700 [Urine:700] Intake/Output this shift: Total I/O In: 240 [P.O.:240] Out: 500 [Urine:500]  Recent Labs    09/18/18 0417 09/19/18 0440  HGB 7.2* 8.1*   Recent Labs    09/18/18 0417 09/19/18 0440  WBC 8.3 8.9  RBC 2.22* 2.52*  HCT 23.1* 25.4*  PLT 128* 131*   Recent Labs    09/18/18 0417  NA 135  K 3.5  CL 96*  CO2 29  BUN 8  CREATININE 0.63  GLUCOSE 140*  CALCIUM 7.2*   No results for input(s): LABPT, INR in the last 72 hours.  Sensation intact distally Intact pulses distally Dorsiflexion/Plantar flexion intact Incision: scant drainage   Assessment/Plan: 2 Days Post-Op Procedure(s) (LRB): ANTERIOR LEFT HIP REVISION (Left) Up with therapy  Discharge in 2-3 days.      Mcarthur Rossetti 09/19/2018, 10:04 AM

## 2018-09-19 NOTE — TOC Progression Note (Addendum)
Transition of Care [facility]) - Progression Note    Patient Details  Name: [name] MRN: [id number] Date of Birth: 01-May-1951  Transition of Care [facility]) CM/SW [place], [place] Phone Number: 09/19/2018, 3:14 PM  Clinical Narrative:     CSW called and advised the patient of his bed offers. Patient would like to discharge to [place]. Patient requested CSW call his daughter, [name] for more input. CSW called [name] at [phone] and she selected [facility]. CSW called and left voice message with Admissions Coordinator, Elyse Hsu,  the family has selected their facility. CSW will follow up on referral on Monday.  Thurmond Butts, MSW, [facility] Clinical Social Worker [phone]   Expected Discharge Plan: Skilled Nursing Facility Barriers to Discharge: Continued Medical Work up  Expected Discharge Plan and Services Expected Discharge Plan: [place]   Discharge Planning Services: NA Post Acute Care Choice: [place] Living arrangements for the past 2 months: [place], [place]                                       Social Determinants of Health (SDOH) Interventions    Readmission Risk Interventions Readmission Risk Prevention Plan 09/16/2018  Transportation Screening Complete  Medication Review Press photographer) Complete  PCP or Specialist appointment within 3-5 days of discharge Complete  HRI or [place] Not Complete  HRI or Home Care Consult Pt Refusal Comments plan for pt to dc to SNF  SW Recovery Care/Counseling Consult Complete  Palliative Care Screening Not Applicable  Skilled Nursing Facility Complete  Some recent data might be hidden

## 2018-09-20 ENCOUNTER — Encounter (HOSPITAL_COMMUNITY): Payer: Self-pay | Admitting: Orthopaedic Surgery

## 2018-09-20 LAB — CBC
HCT: 26.6 % — ABNORMAL LOW (ref 39.0–52.0)
Hemoglobin: 8.4 g/dL — ABNORMAL LOW (ref 13.0–17.0)
MCH: 31.9 pg (ref 26.0–34.0)
MCHC: 31.6 g/dL (ref 30.0–36.0)
MCV: 101.1 fL — ABNORMAL HIGH (ref 80.0–100.0)
Platelets: 127 10*3/uL — ABNORMAL LOW (ref 150–400)
RBC: 2.63 MIL/uL — ABNORMAL LOW (ref 4.22–5.81)
RDW: 17.2 % — ABNORMAL HIGH (ref 11.5–15.5)
WBC: 9 10*3/uL (ref 4.0–10.5)
nRBC: 0 % (ref 0.0–0.2)

## 2018-09-20 NOTE — Progress Notes (Signed)
Physical Therapy Treatment Patient Details Name: [name] MRN: [id number] DOB: [birth year] Today's Date: 09/20/2018    History of Present Illness Pt is a 68 y.o. male with PMH of pain and functional disability in the left hip(s) due to avascular necrosis with femoral head collapse. Original L THA 4/3, Pt dislocated 4/5 requiring closed reduction. L THA revised 4/7, Discharged home where he L hip dislocated again, brough back for closed reduction 4/18. Discharged to SNF, where hip dislocated again 5/6 , s/p 5/8 L THA revision.     PT Comments    Pt tolerated slightly increased distance with gait but continues to fatigue very quickly making it difficult to progress. Verbal cues for tricep activation with stepping to extend trunk to decreased strain on posterior hip but pt's prior back history makes this very difficult for him and he is unable to get fully extended in standing. Ambulated 70' with RW and min A. Needing consistent min A to rise from sitting. PT will continue to follow.   Follow Up Recommendations  SNF     Equipment Recommendations  None recommended by PT    Recommendations for Other Services       Precautions / Restrictions Precautions Precautions: Posterior Hip Precaution Booklet Issued: No Precaution Comments: reviewed precautions again with mobility Required Braces or Orthoses: Knee Immobilizer - Left Knee Immobilizer - Left: On at all times Other Brace: per dr Ninfa Linden. the hip brace should only be doff supine to prevent subluxation. Brace can be doff in standing only if patient is completely upright. pt can not be flexed at the hips or he will sublux.  Restrictions Weight Bearing Restrictions: Yes LLE Weight Bearing: Weight bearing as tolerated    Mobility  Bed Mobility Overal bed mobility: Needs Assistance Bed Mobility: Supine to Sit     Supine to sit: Min guard     General bed mobility comments: increased time and effort, use of bilateral UEs to  move L LE off of bed, min guard for safety and lines.  Transfers Overall transfer level: Needs assistance Equipment used: Rolling walker (2 wheeled) Transfers: Sit to/from [employer] Sit to Stand: Min assist;From elevated surface;Min guard Stand pivot transfers: Min assist       General transfer comment: minguard A from elevated surface, min A from recliner. Min A with RW to pivot to and from [facility]  Ambulation/Gait Ambulation/Gait assistance: Min assist Gait Distance (Feet): 18 Feet Assistive device: Rolling walker (2 wheeled) Gait Pattern/deviations: Step-to pattern;Trunk flexed;Narrow base of support;Step-through pattern Gait velocity: decreased Gait velocity interpretation: <1.31 ft/sec, indicative of household ambulator General Gait Details: pt fatigues very quickly, could not tolerate further distance safely. vc's for tricep extension and lifting chest to decrease trunk flexion but pt has difficulty with this   Stairs             Wheelchair Mobility    Modified Rankin (Stroke Patients Only)       Balance Overall balance assessment: Needs assistance Sitting-balance support: Feet supported;No upper extremity supported Sitting balance-[name]: Fair     Standing balance support: Bilateral upper extremity supported;During functional activity Standing balance-[name]: Poor Standing balance comment: Requires BUE support in standing.                            Cognition Arousal/Alertness: Awake/alert Behavior During Therapy: WFL for tasks assessed/performed Overall Cognitive Status: Within Functional Limits for tasks assessed  Exercises General Exercises - Lower Extremity Ankle Circles/Pumps: AROM;Both;15 reps;Supine Quad Sets: AROM;Both;10 reps;Supine    General Comments General comments (skin integrity, edema, etc.): twin jp drains and wound vac      Pertinent  Vitals/Pain Faces Pain Scale: Hurts little more Pain Location: Lft hip Pain Descriptors / Indicators: Sore;Discomfort    Home Living                      Prior Function            PT Goals (current goals can now be found in the care plan section) Acute Rehab PT Goals Patient Stated Goal: get back to his dog and life PT Goal Formulation: With patient Time For Goal Achievement: 10/01/18 Potential to Achieve Goals: Fair Progress towards PT goals: Progressing toward goals    Frequency    Min 5X/week      PT Plan Current plan remains appropriate    Co-evaluation              AM-PAC PT "6 Clicks" Mobility   Outcome Measure  Help needed turning from your back to your side while in a flat bed without using bedrails?: A Little Help needed moving from lying on your back to sitting on the side of a flat bed without using bedrails?: A Little Help needed moving to and from a bed to a chair (including a wheelchair)?: A Little Help needed standing up from a chair using your arms (e.g., wheelchair or bedside chair)?: A Lot Help needed to walk in hospital room?: A Lot Help needed climbing 3-5 steps with a railing? : Total 6 Click Score: 14    End of Session Equipment Utilized During Treatment: Gait belt;Left knee immobilizer Activity Tolerance: Patient limited by fatigue Patient left: in chair;with call bell/phone within reach Nurse Communication: Mobility status PT Visit Diagnosis: Other abnormalities of gait and mobility (R26.89);Pain Pain - Right/Left: Left Pain - part of body: Hip     Time: 1341-1409 PT Time Calculation (min) (ACUTE ONLY): 28 min  Charges:  $Gait Training: 8-22 mins $Therapeutic Activity: 8-22 mins                     Leighton Roach, PT  Acute Rehab Services  Pager [phone] Office [place] 09/20/2018, 2:17 PM

## 2018-09-20 NOTE — TOC Progression Note (Addendum)
Transition of Care [facility]) - Progression Note    Patient Details  Name: RIKKI SMESTAD MRN: [id number] Date of Birth: Mar 06, 1951  Transition of Care [facility]) CM/SW [place], [place] Phone Number: 09/20/2018, 9:49 AM  Clinical Narrative:    3:27pm- Spoke with pt and pt daughter (via telephone). They both understand that [facility] has a bed (cleared up some confusion with the facility name) and are aware that discharge is either tomorrow or Wednesday. There was some concerns raised with previous facility pt was at and CSW will also provide ombudsman number should pt and pt daughter want to f/u with them regarding facility policies etc.   7:62UQ- CSW spoke with [facility] ([facility]), made them aware pt likely ready tomorrow or Wednesday.    Expected Discharge Plan: Skilled Nursing Facility Barriers to Discharge: Continued Medical Work up  Expected Discharge Plan and Services Expected Discharge Plan: [place]   Discharge Planning Services: NA Post Acute Care Choice: [place] Living arrangements for the past 2 months: [place], [place]   Social Determinants of Health (SDOH) Interventions    Readmission Risk Interventions Readmission Risk Prevention Plan 09/16/2018  Transportation Screening Complete  Medication Review Press photographer) Complete  PCP or Specialist appointment within 3-5 days of discharge Complete  HRI or [place] Not Complete  HRI or Home Care Consult Pt Refusal Comments plan for pt to dc to SNF  SW Recovery Care/Counseling Consult Complete  Palliative Care Screening Not Applicable  Skilled Nursing Facility Complete  Some recent data might be hidden

## 2018-09-20 NOTE — Plan of Care (Signed)
  Problem: Safety: Goal: Ability to remain free from injury will improve Outcome: Progressing   Problem: Skin Integrity: Goal: Risk for impaired skin integrity will decrease Outcome: Progressing   

## 2018-09-20 NOTE — Progress Notes (Signed)
Patient ID: [name], male   DOB: 12/29/1950, 68 y.o.   MRN: [id number] Looks better overall.  Hgb stable.  I kept the hemovac drains in as well as the incisional VAC.  Will likely remove those late today or early tomorrow.  Working better with PT.  Anticipate discharge to SNF Wed this week.

## 2018-09-21 NOTE — Progress Notes (Signed)
PT Cancellation Note  Patient Details Name: [name] MRN: [id number] DOB: [birth year]   Cancelled Treatment:    Reason Eval/Treat Not Completed: Other (comment)(pt with nausea and pain "all over"). Will attempt to see tomorrow before d/c if time allows.   Leighton Roach, PT  Acute Rehab Services  Pager [phone] Office [place] 09/21/2018, 12:18 PM

## 2018-09-21 NOTE — Progress Notes (Signed)
Patient ID: [name], male   DOB: 07-29-1950, 68 y.o.   MRN: [id number] Patient is been making good progress although slow.  I do feel he will be ready for skilled nursing tomorrow with discharge tomorrow.  I did remove the Hemovac drains last evening as well as the incisional VAC sponge.  I placed dry dressings.  There was minimal swelling today and the dressings were still dry.  I will check on him at the end of the day today and again feel that he will be appropriate to discharge him to skilled nursing tomorrow.

## 2018-09-21 NOTE — Plan of Care (Signed)
  Problem: Safety: Goal: Ability to remain free from injury will improve Outcome: Progressing   Problem: Skin Integrity: Goal: Risk for impaired skin integrity will decrease Outcome: Progressing   

## 2018-09-21 NOTE — TOC Progression Note (Signed)
Transition of Care [facility]) - Progression Note    Patient Details  Name: [name] MRN: [id number] Date of Birth: October 16, 1950  Transition of Care [facility]) CM/SW [place], [place] Phone Number: 09/21/2018, 12:07 PM  Clinical Narrative:    Updated [facility]) that pt likely ready for dc tomorrow.    Expected Discharge Plan: Skilled Nursing Facility Barriers to Discharge: Continued Medical Work up  Expected Discharge Plan and Services Expected Discharge Plan: [place]   Discharge Planning Services: NA Post Acute Care Choice: [place] Living arrangements for the past 2 months: [place], [place]                      Social Determinants of Health (SDOH) Interventions    Readmission Risk Interventions Readmission Risk Prevention Plan 09/16/2018  Transportation Screening Complete  Medication Review Press photographer) Complete  PCP or Specialist appointment within 3-5 days of discharge Complete  HRI or [place] Not Complete  HRI or Home Care Consult Pt Refusal Comments plan for pt to dc to SNF  SW Recovery Care/Counseling Consult Complete  Palliative Care Screening Not Applicable  Skilled Nursing Facility Complete  Some recent data might be hidden

## 2018-09-21 NOTE — Discharge Instructions (Signed)
Leave current dressing in place for the next 7 days unless it becomes saturated. Dry dressing to left hip as needed otherwise. Strict posterior hip precautions for left hip. Needs to be in the knee immobilizer at all times when getting out of bed, when in a chair, and when ambulating. The knee immobilizer can be removed occasionally for a few hours when the patient is supine in bed.

## 2018-09-21 NOTE — Care Management Important Message (Signed)
Important Message  Patient Details  Name: [name] MRN: [id number] Date of Birth: [birth year]   Medicare Important Message Given:  Yes    [name] 09/21/2018, 3:26 PM

## 2018-09-22 DIAGNOSIS — Z96642 Presence of left artificial hip joint: Secondary | ICD-10-CM | POA: Diagnosis not present

## 2018-09-22 DIAGNOSIS — G629 Polyneuropathy, unspecified: Secondary | ICD-10-CM | POA: Diagnosis not present

## 2018-09-22 DIAGNOSIS — R29898 Other symptoms and signs involving the musculoskeletal system: Secondary | ICD-10-CM | POA: Diagnosis not present

## 2018-09-22 DIAGNOSIS — B20 Human immunodeficiency virus [HIV] disease: Secondary | ICD-10-CM | POA: Diagnosis not present

## 2018-09-22 DIAGNOSIS — M6281 Muscle weakness (generalized): Secondary | ICD-10-CM | POA: Diagnosis not present

## 2018-09-22 DIAGNOSIS — I82509 Chronic embolism and thrombosis of unspecified deep veins of unspecified lower extremity: Secondary | ICD-10-CM | POA: Diagnosis not present

## 2018-09-22 DIAGNOSIS — K5730 Diverticulosis of large intestine without perforation or abscess without bleeding: Secondary | ICD-10-CM | POA: Diagnosis not present

## 2018-09-22 DIAGNOSIS — G8918 Other acute postprocedural pain: Secondary | ICD-10-CM | POA: Diagnosis present

## 2018-09-22 DIAGNOSIS — D689 Coagulation defect, unspecified: Secondary | ICD-10-CM | POA: Diagnosis not present

## 2018-09-22 DIAGNOSIS — R4181 Age-related cognitive decline: Secondary | ICD-10-CM | POA: Diagnosis not present

## 2018-09-22 DIAGNOSIS — A519 Early syphilis, unspecified: Secondary | ICD-10-CM | POA: Diagnosis not present

## 2018-09-22 DIAGNOSIS — R278 Other lack of coordination: Secondary | ICD-10-CM | POA: Diagnosis not present

## 2018-09-22 DIAGNOSIS — I97622 Postprocedural seroma of a circulatory system organ or structure following other procedure: Secondary | ICD-10-CM | POA: Diagnosis not present

## 2018-09-22 DIAGNOSIS — Z471 Aftercare following joint replacement surgery: Secondary | ICD-10-CM | POA: Diagnosis not present

## 2018-09-22 DIAGNOSIS — I10 Essential (primary) hypertension: Secondary | ICD-10-CM | POA: Diagnosis not present

## 2018-09-22 DIAGNOSIS — F329 Major depressive disorder, single episode, unspecified: Secondary | ICD-10-CM | POA: Diagnosis not present

## 2018-09-22 DIAGNOSIS — M810 Age-related osteoporosis without current pathological fracture: Secondary | ICD-10-CM | POA: Diagnosis not present

## 2018-09-22 DIAGNOSIS — Z951 Presence of aortocoronary bypass graft: Secondary | ICD-10-CM | POA: Diagnosis not present

## 2018-09-22 DIAGNOSIS — C9111 Chronic lymphocytic leukemia of B-cell type in remission: Secondary | ICD-10-CM | POA: Diagnosis not present

## 2018-09-22 DIAGNOSIS — T8149XA Infection following a procedure, other surgical site, initial encounter: Secondary | ICD-10-CM | POA: Diagnosis not present

## 2018-09-22 DIAGNOSIS — I959 Hypotension, unspecified: Secondary | ICD-10-CM | POA: Diagnosis not present

## 2018-09-22 DIAGNOSIS — M25552 Pain in left hip: Secondary | ICD-10-CM | POA: Diagnosis not present

## 2018-09-22 DIAGNOSIS — R2689 Other abnormalities of gait and mobility: Secondary | ICD-10-CM | POA: Diagnosis not present

## 2018-09-22 DIAGNOSIS — M069 Rheumatoid arthritis, unspecified: Secondary | ICD-10-CM | POA: Diagnosis not present

## 2018-09-22 DIAGNOSIS — M2550 Pain in unspecified joint: Secondary | ICD-10-CM | POA: Diagnosis not present

## 2018-09-22 DIAGNOSIS — R1030 Lower abdominal pain, unspecified: Secondary | ICD-10-CM | POA: Diagnosis not present

## 2018-09-22 DIAGNOSIS — M797 Fibromyalgia: Secondary | ICD-10-CM | POA: Diagnosis not present

## 2018-09-22 DIAGNOSIS — Z7401 Bed confinement status: Secondary | ICD-10-CM | POA: Diagnosis not present

## 2018-09-22 DIAGNOSIS — Z79899 Other long term (current) drug therapy: Secondary | ICD-10-CM | POA: Diagnosis not present

## 2018-09-22 DIAGNOSIS — B159 Hepatitis A without hepatic coma: Secondary | ICD-10-CM | POA: Diagnosis not present

## 2018-09-22 DIAGNOSIS — T8131XA Disruption of external operation (surgical) wound, not elsewhere classified, initial encounter: Secondary | ICD-10-CM | POA: Diagnosis not present

## 2018-09-22 DIAGNOSIS — R52 Pain, unspecified: Secondary | ICD-10-CM | POA: Diagnosis not present

## 2018-09-22 DIAGNOSIS — M96842 Postprocedural seroma of a musculoskeletal structure following a musculoskeletal system procedure: Secondary | ICD-10-CM | POA: Diagnosis not present

## 2018-09-22 DIAGNOSIS — I659 Occlusion and stenosis of unspecified precerebral artery: Secondary | ICD-10-CM | POA: Diagnosis not present

## 2018-09-22 DIAGNOSIS — I2510 Atherosclerotic heart disease of native coronary artery without angina pectoris: Secondary | ICD-10-CM | POA: Diagnosis not present

## 2018-09-22 DIAGNOSIS — R5381 Other malaise: Secondary | ICD-10-CM | POA: Diagnosis not present

## 2018-09-22 DIAGNOSIS — D649 Anemia, unspecified: Secondary | ICD-10-CM | POA: Diagnosis not present

## 2018-09-22 DIAGNOSIS — E876 Hypokalemia: Secondary | ICD-10-CM | POA: Diagnosis not present

## 2018-09-22 DIAGNOSIS — R531 Weakness: Secondary | ICD-10-CM | POA: Diagnosis not present

## 2018-09-22 DIAGNOSIS — M7989 Other specified soft tissue disorders: Secondary | ICD-10-CM | POA: Diagnosis not present

## 2018-09-22 DIAGNOSIS — F419 Anxiety disorder, unspecified: Secondary | ICD-10-CM | POA: Diagnosis not present

## 2018-09-22 DIAGNOSIS — E785 Hyperlipidemia, unspecified: Secondary | ICD-10-CM | POA: Diagnosis not present

## 2018-09-22 DIAGNOSIS — D696 Thrombocytopenia, unspecified: Secondary | ICD-10-CM | POA: Diagnosis not present

## 2018-09-22 MED ORDER — HYDROMORPHONE HCL 2 MG PO TABS: 2.0000 mg | ORAL_TABLET | ORAL | 0 refills | Status: DC | PRN

## 2018-09-22 NOTE — Progress Notes (Signed)
Patient ID: [name], male   DOB: October 21, 1950, 68 y.o.   MRN: [id number] The patient looks good overall today.  His incision is clean, dry and intact.  His left hip is well located.  A new dressing was applied.  He can be discharged to skilled nursing today.

## 2018-09-22 NOTE — Discharge Summary (Signed)
Patient ID: [name] MRN: [id number] DOB/AGE: 01-17-1951 68 y.o.  Admit date: 09/15/2018 Discharge date: 09/22/2018  Admission Diagnoses:  Active Problems:   Closed dislocation of left hip (HCC)   Dislocation of hip prosthesis [facility])   Discharge Diagnoses:  Same  Past Medical History:  Diagnosis Date  . Allergy   . Anxiety   . Carotid artery occlusion    40-60% right ICA stenosis (09/2008)  . Cataract   . Chronic back pain   . CLL (chronic lymphoblastic leukemia) dx 2010   Followed at [facility] q36mo, no current therapy   . Clotting disorder ([place])   . Coronary artery disease 2010   s/p CABG '10, sees Dr. Percival Spanish  . Depression   . Diverticulosis   . DVT, lower extremity, recurrent ([place]) 2008, 2009   LLE, chronic anticoag since 2009  . Esophagitis   . Fibromyalgia   . Gallstones   . GERD (gastroesophageal reflux disease)   . Gout   . Gynecomastia, male   . H/O hiatal hernia 2008   surgery  . Hemorrhoids   . Hepatitis A yrs ago  . HIV infection ([place]) dx 1993  . Hypertension   . Impotence of organic origin   . Myocardial infarction ([place]) 2010    x 2  . Neuromuscular disorder (HCC)    neuropathy  . Osteoarthritis, knee    s/p B TKA  . Osteoporosis   . Pneumonia mrach, may, july 2017  . Rheumatoid arthritis(714.0) dx 2010   MTX, follows with rheum  . Seasonal allergies   . Secondary syphilis 07/24/14 dx   s/p 2 wks doxy  . Status post dilation of esophageal narrowing   . Stroke ([place]) [place]   . TIA (transient ischemic attack) 1997   mild residual L mouth droop  . Tubular adenoma of colon     Surgeries: Procedure(s): ANTERIOR LEFT HIP REVISION on 09/17/2018   Consultants:   Discharged Condition: Improved  Hospital Course: FIRMAN PETROW is an 68 y.o. male who was admitted 09/15/2018 for operative treatment of<principal problem not specified>. Patient has severe unremitting pain that affects sleep, daily activities, and work/hobbies. After pre-op  clearance the patient was taken to the operating room on 09/17/2018 and underwent  Procedure(s): ANTERIOR LEFT HIP REVISION.    Patient was given perioperative antibiotics:  Anti-infectives (From admission, onward)   Start     Dose/Rate Route Frequency Ordered Stop   09/17/18 1600  vancomycin (VANCOCIN) IVPB 1000 mg/200 mL premix     1,000 mg 200 mL/hr over 60 Minutes Intravenous Every 12 hours 09/17/18 1455 09/17/18 2113   09/17/18 1239  vancomycin (VANCOCIN) powder  Status:  Discontinued       As needed 09/17/18 1239 09/17/18 1326   09/17/18 1019  clindamycin (CLEOCIN) 900 MG/50ML IVPB    Note to Pharmacy:  Rejeana Brock   : cabinet override      09/17/18 1019 09/17/18 1036   09/15/18 1630  darunavir (PREZISTA) tablet 800 mg    Note to Pharmacy:  TAKE 1 TABLET (800 MG TOTAL) BY MOUTH DAILY.     800 mg Oral Daily with breakfast 09/15/18 1519     09/15/18 1630  valACYclovir (VALTREX) tablet 500 mg     500 mg Oral Daily 09/15/18 1519     09/15/18 1600  elvitegravir-cobicistat-emtricitabine-tenofovir (GENVOYA) 150-150-200-10 MG tablet 1 tablet     1 tablet Oral Daily with breakfast 09/15/18 1519  Patient was given sequential compression devices, early ambulation, and chemoprophylaxis to prevent DVT.  Patient benefited maximally from hospital stay and there were no complications.  He was taken to the operating room for revision of the acetabular component including changing position of the component and placing a lipped liner.  He stayed well located after this.  From a physical therapy standpoint, strict posterior hip precautions should be adhered to.  He should be in his knee immobilizer at all times especially when he is in the chair and ambulating.  They should work on having him stand straight upright.  If his hip dislocates again, he will need to be admitted so he can be reduced under general anesthesia in the operating room and not in the emergency room.  We will see him back in the  office in 2 weeks.  Recent vital signs:  Patient Vitals for the past 24 hrs:  BP Temp Temp src Pulse Resp SpO2  09/22/18 0427 123/72 98.2 F (36.8 C) Oral 85 14 96 %  09/21/18 1438 105/74 98.7 F (37.1 C) Oral 92 16 98 %     Recent laboratory studies:  Recent Labs    09/20/18 0440  WBC 9.0  HGB 8.4*  HCT 26.6*  PLT 127*     Discharge Medications:   Allergies as of 09/22/2018      Reactions   Golimumab Anaphylaxis   Simponi) ARIA   Orencia [abatacept] Anaphylaxis   Other Anaphylaxis, Hives   Pecans   Peanut-containing Drug Products Anaphylaxis, Hives, Swelling   Swelling of throat   Morphine Other (See Comments)   Severe headache- Can tolerate Dilaudid, however   Oxycodone-acetaminophen Other (See Comments)   Headache   Penicillins Rash, Other (See Comments)   FLUSHED, RED Has patient had a PCN reaction causing immediate rash, facial/tongue/throat swelling, SOB or lightheadedness with hypotension: Yes Has patient had a PCN reaction causing severe rash involving mucus membranes or skin necrosis: No Has patient had a PCN reaction that required hospitalization: No Has patient had a PCN reaction occurring within the last 10 years: No If all of the above answers are "NO", then may proceed with Cephalosporin use   Promethazine Hcl Other (See Comments)   Makes him feel "drunk" at higher strengths      Medication List    STOP taking these medications   dextromethorphan 30 MG/5ML liquid Commonly known as:  Delsym   doxycycline 100 MG tablet Commonly known as:  VIBRA-TABS   HYDROcodone-acetaminophen 7.5-325 MG tablet Commonly known as:  NORCO   traMADol 50 MG tablet Commonly known as:  ULTRAM     TAKE these medications   acetaminophen 500 MG tablet Commonly known as:  TYLENOL Take 500-1,000 mg by mouth every 6 (six) hours as needed for mild pain or headache.   atorvastatin 10 MG tablet Commonly known as:  LIPITOR Take 1 tablet (10 mg total) by mouth at  bedtime.   darunavir 800 MG tablet Commonly known as:  Prezista TAKE 1 TABLET (800 MG TOTAL) BY MOUTH DAILY. What changed:    how much to take  how to take this  when to take this  additional instructions   elvitegravir-cobicistat-emtricitabine-tenofovir 150-150-200-10 MG Tabs tablet Commonly known as:  Genvoya Take 1 tablet by mouth daily with breakfast.   EpiPen 2-Pak 0.3 mg/0.3 mL Soaj injection Generic drug:  EPINEPHrine Inject 0.3 mg into the muscle as needed for anaphylaxis.   escitalopram 20 MG tablet Commonly known as:  LEXAPRO Take  20 mg by mouth daily.   famotidine 40 MG tablet Commonly known as:  PEPCID TAKE 1 TABLET BY MOUTH TWICE DAILY FOR 2 MONTHS What changed:  See the new instructions.   folic acid 1 MG tablet Commonly known as:  FOLVITE Take 1 mg by mouth daily.   furosemide 20 MG tablet Commonly known as:  LASIX Take 1 tablet (20 mg total) by mouth 2 (two) times daily.   HYDROmorphone 2 MG tablet Commonly known as:  DILAUDID Take 1 tablet (2 mg total) by mouth every 4 (four) hours as needed for severe pain.   leflunomide 20 MG tablet Commonly known as:  ARAVA Take 20 mg by mouth daily.   nitroGLYCERIN 0.4 MG SL tablet Commonly known as:  NITROSTAT Place 1 tablet (0.4 mg total) under the tongue every 5 (five) minutes as needed for chest pain.   polyethylene glycol 17 g packet Commonly known as:  MIRALAX / GLYCOLAX Take 17 g by mouth daily.   predniSONE 5 MG tablet Commonly known as:  DELTASONE Take 5 mg by mouth 2 (two) times daily.   pregabalin 200 MG capsule Commonly known as:  LYRICA TAKE 1 CAPSULE BY MOUTH TWICE A DAY   Rexulti 0.25 MG Tabs Generic drug:  Brexpiprazole Take 0.25 mg by mouth daily.   Savaysa 30 MG Tabs tablet Generic drug:  edoxaban TAKE 1 TABLET (30 MG TOTAL) BY MOUTH DAILY.   senna 8.6 MG Tabs tablet Commonly known as:  SENOKOT Take 1 tablet by mouth daily.   sulfamethoxazole-trimethoprim 800-160 MG  tablet Commonly known as:  BACTRIM DS TAKE 1 TABLET BY MOUTH EVERY 12 (TWELVE) HOURS.   valACYclovir 500 MG tablet Commonly known as:  VALTREX Take 1 tablet (500 mg total) by mouth daily.       Diagnostic Studies: Dg Pelvis Portable  Result Date: 09/17/2018 CLINICAL DATA:  Left hip revision. EXAM: PORTABLE PELVIS 1-2 VIEWS COMPARISON:  09/09/2018 and 09/15/2018 FINDINGS: Portable view of the pelvis demonstrates a total of left hip arthroplasty. The entire left femoral stem is visualized without a periprosthetic fracture. Surgical drains are present in the left hip soft tissues. Extensive surgical hardware involving the lower lumbar spine. Pelvic bony ring appears to be intact. No gross abnormality to the right hip. IMPRESSION: Left hip arthroplasty without complicating features. Electronically Signed   By: Markus Daft M.D.   On: 09/17/2018 14:20   Dg Chest Port 1 View  Result Date: 08/30/2018 CLINICAL DATA:  Cough and wheezing EXAM: PORTABLE CHEST 1 VIEW COMPARISON:  08/28/2018 FINDINGS: Normal heart size. Lungs are under aerated with bibasilar atelectasis. No pneumothorax or pleural effusion. IMPRESSION: Bibasilar atelectasis. Electronically Signed   By: Marybelle Killings M.D.   On: 08/30/2018 16:31   Dg Chest Port 1 View  Result Date: 08/28/2018 CLINICAL DATA:  Preoperative study.  Multiple hip dislocations. EXAM: PORTABLE CHEST 1 VIEW COMPARISON:  August 17, 2018 FINDINGS: No pneumothorax. The cardiomediastinal silhouette is stable with mild cardiomegaly. Mild opacity at the left lung base is stable, likely scar atelectasis. The right lung is clear. No other changes. IMPRESSION: Stable opacity in left base favored represent scar atelectasis given stability. No acute abnormality or change. Electronically Signed   By: Dorise Bullion III M.D   On: 08/28/2018 14:57   Dg C-arm 1-60 Min  Result Date: 09/17/2018 CLINICAL DATA:  Revision left anterior hip replacement. EXAM: DG C-ARM 61-120 MIN; OPERATIVE  LEFT HIP WITH PELVIS FLUOROSCOPY TIME:  41 second COMPARISON:  Intraoperative  reduction of left total hip replacement-09/15/2018; left hip radiographs-09/15/2018 FINDINGS: 2 spot intraoperative fluoroscopic images of the left hip are provided for review. Provided images demonstrate an apparently new left hip prosthesis, now with 4 anchoring acetabular screws. Alignment appears anatomic given AP projection. Expected subcutaneous emphysema about the operative site. No radiopaque foreign body. IMPRESSION: Post redo left total hip replacement without evidence of complication. Electronically Signed   By: Sandi Mariscal M.D.   On: 09/17/2018 12:45   Dg C-arm 1-60 Min  Result Date: 09/15/2018 CLINICAL DATA:  Hip reduction EXAM: DG C-ARM 61-120 MIN COMPARISON:  09/15/2018 FINDINGS: The patient is status post reduction of the left hip. The alignment appears significantly improved. The fluoroscopy time was 6 seconds. One image was submitted. IMPRESSION: Closed reduction of the left hip Electronically Signed   By: Constance Holster M.D.   On: 09/15/2018 14:36   Dg C-arm 1-60 Min  Result Date: 08/28/2018 CLINICAL DATA:  68 year old male undergoing closed reduction of prosthetic left hip dislocation. EXAM: DG HIP (WITH OR WITHOUT PELVIS) 1V*L*; DG C-ARM 61-120 MIN COMPARISON:  1018 hours earlier today. Intraoperative hip arthroplasty images 08/17/18. FLUOROSCOPY TIME:  0 minutes 12 seconds. FINDINGS: Single AP fluoroscopic spot view at 1628 hours demonstrates normal AP alignment of the total left hip arthroplasty components. IMPRESSION: Left hip dislocation appears reduced on a single AP fluoroscopic spot image. Electronically Signed   By: Genevie Ann M.D.   On: 08/28/2018 18:33   Dg Hip Unilat With Pelvis 1v Left  Result Date: 09/15/2018 CLINICAL DATA:  Hip reduction EXAM: DG HIP (WITH OR WITHOUT PELVIS) 1V*L* COMPARISON:  None. FINDINGS: The patient is status post total hip arthroplasty on the left. The alignment appears  improved. The hip is no longer dislocated. There is no definite displaced fracture. One image was obtained. The total fluoroscopy time was 6 seconds. IMPRESSION: Status post reduction of the left hip. Electronically Signed   By: Constance Holster M.D.   On: 09/15/2018 14:34   Dg Hip Unilat With Pelvis 1v Left  Result Date: 08/28/2018 CLINICAL DATA:  68 year old male undergoing closed reduction of prosthetic left hip dislocation. EXAM: DG HIP (WITH OR WITHOUT PELVIS) 1V*L*; DG C-ARM 61-120 MIN COMPARISON:  1018 hours earlier today. Intraoperative hip arthroplasty images 08/17/18. FLUOROSCOPY TIME:  0 minutes 12 seconds. FINDINGS: Single AP fluoroscopic spot view at 1628 hours demonstrates normal AP alignment of the total left hip arthroplasty components. IMPRESSION: Left hip dislocation appears reduced on a single AP fluoroscopic spot image. Electronically Signed   By: Genevie Ann M.D.   On: 08/28/2018 18:33   Dg Hip Port Unilat W Or Wo Pelvis 1 View Left  Result Date: 09/15/2018 CLINICAL DATA:  Postreduction EXAM: DG HIP (WITH OR WITHOUT PELVIS) 1V PORT LEFT COMPARISON:  09/15/2018 FINDINGS: Continued superior dislocation of the left hip replacement, unchanged. IMPRESSION: Continued superior dislocation. Electronically Signed   By: Rolm Baptise M.D.   On: 09/15/2018 02:59   Dg Hip Operative Unilat W Or W/o Pelvis Left  Result Date: 09/17/2018 CLINICAL DATA:  Revision left anterior hip replacement. EXAM: DG C-ARM 61-120 MIN; OPERATIVE LEFT HIP WITH PELVIS FLUOROSCOPY TIME:  41 second COMPARISON:  Intraoperative reduction of left total hip replacement-09/15/2018; left hip radiographs-09/15/2018 FINDINGS: 2 spot intraoperative fluoroscopic images of the left hip are provided for review. Provided images demonstrate an apparently new left hip prosthesis, now with 4 anchoring acetabular screws. Alignment appears anatomic given AP projection. Expected subcutaneous emphysema about the operative site. No radiopaque  foreign body. IMPRESSION: Post redo left total hip replacement without evidence of complication. Electronically Signed   By: Sandi Mariscal M.D.   On: 09/17/2018 12:45   Dg Hip Unilat W Or Wo Pelvis 2-3 Views Left  Result Date: 09/15/2018 CLINICAL DATA:  Possible dislocation EXAM: DG HIP (WITH OR WITHOUT PELVIS) 2-3V LEFT COMPARISON:  08/15/2018 FINDINGS: There is dislocation of the left hip replacement with the femoral head component dislocated superiorly. No fracture. IMPRESSION: Superior dislocation of the left hip replacement. Electronically Signed   By: Rolm Baptise M.D.   On: 09/15/2018 01:47   Xr Pelvis 1-2 Views  Result Date: 09/09/2018 An AP pelvis shows a left total hip arthroplasty that is located and shows no complicating features.   Disposition: Discharge disposition: 03-Skilled [place] information for follow-up providers    Mcarthur Rossetti, MD Follow up in 2 week(s).   Specialty:  Orthopedic Surgery Contact information: [place] [id number] [phone]            Contact information for after-discharge care    Destination    [facility] [place] [facility] .   Service:  Skilled Nursing Contact information: [address] [place] [phone]                   Signed: Mcarthur Rossetti 09/22/2018, 7:26 AM

## 2018-09-22 NOTE — Social Work (Signed)
Clinical Social Worker facilitated patient discharge including contacting patient family and facility to confirm patient discharge plans.  Clinical information faxed to facility and family agreeable with plan.  CSW arranged ambulance transport via [facility] to [facility] at 12:30pm. RN to call [phone]  with report prior to discharge.  Clinical Social Worker will sign off for now as social work intervention is no longer needed. Please consult [place] again if new need arises.  Westley Hummer, MSW, [place] Social Worker [phone]

## 2018-09-22 NOTE — Progress Notes (Signed)
Physical Therapy Treatment Patient Details Name: [name] MRN: [id number] DOB: 10/16/1950 Today's Date: 09/22/2018    History of Present Illness Pt is a 68 y.o. male with PMH of pain and functional disability in the left hip(s) due to avascular necrosis with femoral head collapse. Original L THA 4/3, Pt dislocated 4/5 requiring closed reduction. L THA revised 4/7, Discharged home where he L hip dislocated again, brough back for closed reduction 4/18. Discharged to SNF, where hip dislocated again 5/6 , s/p 5/8 L THA revision.     PT Comments    Pt declining transfers or gait training at this time; agreeable to therex (please see below). Pt would continue to benefit from skilled physical therapy services at this time while admitted and after d/c to address the below listed limitations in order to improve overall safety and independence with functional mobility.    Follow Up Recommendations  SNF     Equipment Recommendations  None recommended by PT    Recommendations for Other Services       Precautions / Restrictions Precautions Precautions: Posterior Hip Precaution Booklet Issued: No Precaution Comments: reviewed precautions again with mobility Required Braces or Orthoses: Knee Immobilizer - Left Knee Immobilizer - Left: On at all times Other Brace: per dr Ninfa Linden. the hip brace should only be doff supine to prevent subluxation. Brace can be doff in standing only if patient is completely upright. pt can not be flexed at the hips or he will sublux.  Restrictions Weight Bearing Restrictions: Yes LLE Weight Bearing: Weight bearing as tolerated    Mobility  Bed Mobility               General bed mobility comments: pt OOB in recliner chair upon arrival  Transfers                 General transfer comment: pt declining transfers at this time  Ambulation/Gait                 Stairs             Wheelchair Mobility    Modified Rankin (Stroke  Patients Only)       Balance                                            Cognition Arousal/Alertness: Awake/alert Behavior During Therapy: WFL for tasks assessed/performed Overall Cognitive Status: Within Functional Limits for tasks assessed                                        Exercises General Exercises - Lower Extremity Ankle Circles/Pumps: AROM;Both;Seated Quad Sets: AROM;Right;10 reps;Seated Heel Slides: AAROM;Right;10 reps;Seated Hip ABduction/ADduction: AROM;Strengthening;Right;10 reps;Seated Straight Leg Raises: AAROM;Right;10 reps;Seated Other Exercises Other Exercises: chair push-ups x10    General Comments        Pertinent Vitals/Pain Pain Assessment: Faces Faces Pain Scale: Hurts little more Pain Location: L hip Pain Descriptors / Indicators: Sore;Discomfort Pain Intervention(s): Monitored during session;Repositioned    Home Living                      Prior Function            PT Goals (current goals can now be found in the care plan section) Acute  Rehab PT Goals PT Goal Formulation: With patient Time For Goal Achievement: 10/01/18 Potential to Achieve Goals: Fair Progress towards PT goals: Progressing toward goals    Frequency    Min 5X/week      PT Plan Current plan remains appropriate    Co-evaluation              AM-PAC PT "6 Clicks" Mobility   Outcome Measure  Help needed turning from your back to your side while in a flat bed without using bedrails?: A Little Help needed moving from lying on your back to sitting on the side of a flat bed without using bedrails?: A Little Help needed moving to and from a bed to a chair (including a wheelchair)?: A Little Help needed standing up from a chair using your arms (e.g., wheelchair or bedside chair)?: A Lot Help needed to walk in hospital room?: A Lot Help needed climbing 3-5 steps with a railing? : Total 6 Click Score: 14    End of  Session Equipment Utilized During Treatment: Left knee immobilizer Activity Tolerance: Patient limited by fatigue;Patient limited by pain Patient left: in chair;with call bell/phone within reach;with chair alarm set Nurse Communication: Mobility status PT Visit Diagnosis: Other abnormalities of gait and mobility (R26.89);Pain Pain - Right/Left: Left Pain - part of body: Hip     Time: 2440-1027 PT Time Calculation (min) (ACUTE ONLY): 21 min  Charges:  $Therapeutic Exercise: 8-22 mins                     Sherie Don, [place], DPT  Acute Rehabilitation Services Pager [phone] Office [place] 09/22/2018, 12:38 PM

## 2018-09-22 NOTE — TOC Transition Note (Signed)
Transition of Care [facility]) - CM/SW Discharge Note   Patient Details  Name: [name] MRN: [id number] Date of Birth: 1950/08/23  [facility]) CM/SW Contact:  Alexander Mt, [place] Phone Number: 09/22/2018, 9:44 AM   Clinical Narrative:    Pt appropriate for d/c- bed available at [facility]). Pt and pt daughter in agreement.  Pt provided ombudsman information should he need that.   RN aware, script and PTAR papers on chart.     Barriers to Discharge: Barriers Resolved   Patient Goals and CMS Choice Patient states their goals for this hospitalization and ongoing recovery are:: to feel better CMS Medicare.gov Compare Post Acute Care list provided to:: Patient Choice offered to / list presented to : Patient  Discharge Placement   Existing PASRR number confirmed : 09/17/18          Patient chooses bed at: [facility] Patient to be transferred to facility by: [place] Name of family member notified: [name] (daughter) Patient and family notified of of transfer: 09/22/18  Discharge Plan and Services   Discharge Planning Services: NA Post Acute Care Choice: [place]                               Social Determinants of Health (SDOH) Interventions     Readmission Risk Interventions Readmission Risk Prevention Plan 09/16/2018  Transportation Screening Complete  Medication Review [employer]) Complete  PCP or Specialist appointment within 3-5 days of discharge Complete  HRI or [place] Not Complete  HRI or Home Care Consult Pt Refusal Comments plan for pt to dc to SNF  SW Recovery Care/Counseling Consult Complete  Palliative Care Screening Not Applicable  Skilled Nursing Facility Complete  Some recent data might be hidden

## 2018-09-22 NOTE — Care Plan (Signed)
RNCM spoke with [name] and his daughter this morning about his discharge plan. Anticipated discharge today to [facility] He verbalized he is feeling much better and is ready for discharge today. Reminded of follow up with Dr. Ninfa Linden in 1 week on 09/30/18 at 2:45 pm. Spoke with daughter and she is aware of this appointment. RNCM also left VM with hospital CSW to make aware that a follow up appointment has been made for him and that [facility] will be reaching out to admission coordinator with the SNF to ensure they are aware he is an Ortho Bundle patient. Spoke with Elyse Hsu at [employer] and [employer]) and updated on plan of care including patient's follow up with MD next week. She was appreciative of the update.

## 2018-09-23 ENCOUNTER — Telehealth: Payer: Self-pay | Admitting: *Deleted

## 2018-09-23 NOTE — Telephone Encounter (Signed)
Pt was on TCM report he was admitted 09/15/2018 for operative treatment of<principal problem not specified>. Patient has severe unremitting pain that affects sleep, daily activities, and work/hobbies. On 09/17/18 pt underwent  Procedure(s): ANTERIOR LEFT HIP REVISION. Pt was D/C 5/13/20to SNF for PT. He is scheduled to follow=up w/surgeon in 2 weeks.Marland KitchenJohny Chess

## 2018-09-24 DIAGNOSIS — I2510 Atherosclerotic heart disease of native coronary artery without angina pectoris: Secondary | ICD-10-CM | POA: Diagnosis not present

## 2018-09-24 DIAGNOSIS — M069 Rheumatoid arthritis, unspecified: Secondary | ICD-10-CM | POA: Diagnosis not present

## 2018-09-24 DIAGNOSIS — R5381 Other malaise: Secondary | ICD-10-CM | POA: Diagnosis not present

## 2018-09-24 DIAGNOSIS — R531 Weakness: Secondary | ICD-10-CM | POA: Diagnosis not present

## 2018-09-26 ENCOUNTER — Emergency Department (HOSPITAL_COMMUNITY): Payer: Medicare Other

## 2018-09-26 ENCOUNTER — Encounter (HOSPITAL_COMMUNITY): Payer: Self-pay | Admitting: Emergency Medicine

## 2018-09-26 ENCOUNTER — Other Ambulatory Visit: Payer: Self-pay

## 2018-09-26 ENCOUNTER — Emergency Department (HOSPITAL_COMMUNITY)
Admission: EM | Admit: 2018-09-26 | Discharge: 2018-09-26 | Disposition: A | Discharge status: Home / Self Care | Payer: Medicare Other | Attending: Emergency Medicine | Admitting: Emergency Medicine

## 2018-09-26 DIAGNOSIS — I97622 Postprocedural seroma of a circulatory system organ or structure following other procedure: Secondary | ICD-10-CM | POA: Insufficient documentation

## 2018-09-26 DIAGNOSIS — M7989 Other specified soft tissue disorders: Secondary | ICD-10-CM | POA: Diagnosis not present

## 2018-09-26 DIAGNOSIS — M96842 Postprocedural seroma of a musculoskeletal structure following a musculoskeletal system procedure: Secondary | ICD-10-CM | POA: Diagnosis not present

## 2018-09-26 DIAGNOSIS — R1030 Lower abdominal pain, unspecified: Secondary | ICD-10-CM | POA: Insufficient documentation

## 2018-09-26 DIAGNOSIS — Z79899 Other long term (current) drug therapy: Secondary | ICD-10-CM | POA: Insufficient documentation

## 2018-09-26 DIAGNOSIS — D696 Thrombocytopenia, unspecified: Secondary | ICD-10-CM | POA: Diagnosis not present

## 2018-09-26 DIAGNOSIS — K5730 Diverticulosis of large intestine without perforation or abscess without bleeding: Secondary | ICD-10-CM | POA: Diagnosis not present

## 2018-09-26 DIAGNOSIS — I10 Essential (primary) hypertension: Secondary | ICD-10-CM | POA: Diagnosis not present

## 2018-09-26 LAB — CBC WITH DIFFERENTIAL/PLATELET
Abs Immature Granulocytes: 0 10*3/uL (ref 0.00–0.07)
Basophils Absolute: 0 10*3/uL (ref 0.0–0.1)
Basophils Relative: 0 %
Eosinophils Absolute: 0.1 10*3/uL (ref 0.0–0.5)
Eosinophils Relative: 1 %
HCT: 32 % — ABNORMAL LOW (ref 39.0–52.0)
Hemoglobin: 10.1 g/dL — ABNORMAL LOW (ref 13.0–17.0)
Lymphocytes Relative: 53 %
Lymphs Abs: 5.2 10*3/uL — ABNORMAL HIGH (ref 0.7–4.0)
MCH: 31.2 pg (ref 26.0–34.0)
MCHC: 31.6 g/dL (ref 30.0–36.0)
MCV: 98.8 fL (ref 80.0–100.0)
Monocytes Absolute: 0 10*3/uL — ABNORMAL LOW (ref 0.1–1.0)
Monocytes Relative: 0 %
Neutro Abs: 4.6 10*3/uL (ref 1.7–7.7)
Neutrophils Relative %: 46 %
Platelets: 144 10*3/uL — ABNORMAL LOW (ref 150–400)
RBC: 3.24 MIL/uL — ABNORMAL LOW (ref 4.22–5.81)
RDW: 16.3 % — ABNORMAL HIGH (ref 11.5–15.5)
WBC Morphology: ABNORMAL
WBC: 9.9 10*3/uL (ref 4.0–10.5)
nRBC: 0 % (ref 0.0–0.2)

## 2018-09-26 LAB — URINALYSIS, ROUTINE W REFLEX MICROSCOPIC
Bilirubin Urine: NEGATIVE
Glucose, UA: NEGATIVE mg/dL
Hgb urine dipstick: NEGATIVE
Ketones, ur: NEGATIVE mg/dL
Leukocytes,Ua: NEGATIVE
Nitrite: NEGATIVE
Protein, ur: NEGATIVE mg/dL
Specific Gravity, Urine: 1.006 (ref 1.005–1.030)
pH: 7 (ref 5.0–8.0)

## 2018-09-26 LAB — BASIC METABOLIC PANEL
Anion gap: 11 (ref 5–15)
BUN: 8 mg/dL (ref 8–23)
CO2: 26 mmol/L (ref 22–32)
Calcium: 7.6 mg/dL — ABNORMAL LOW (ref 8.9–10.3)
Chloride: 95 mmol/L — ABNORMAL LOW (ref 98–111)
Creatinine, Ser: 0.75 mg/dL (ref 0.61–1.24)
GFR calc Af Amer: >60 mL/min (ref >60)
GFR calc non Af Amer: >60 mL/min (ref >60)
Glucose, Bld: 128 mg/dL — ABNORMAL HIGH (ref 70–99)
Potassium: 3 mmol/L — ABNORMAL LOW (ref 3.5–5.1)
Sodium: 132 mmol/L — ABNORMAL LOW (ref 135–145)

## 2018-09-26 LAB — LACTIC ACID, PLASMA: Lactic Acid, Venous: 1.2 mmol/L (ref 0.5–1.9)

## 2018-09-26 LAB — C-REACTIVE PROTEIN: CRP: 6.7 mg/dL — ABNORMAL HIGH (ref <1.0)

## 2018-09-26 MED ORDER — SODIUM CHLORIDE 0.9 % IV BOLUS
500.0000 mL | Freq: Once | INTRAVENOUS | Status: AC
  Administered 2018-09-26: 500 mL via INTRAVENOUS

## 2018-09-26 MED ORDER — POTASSIUM CHLORIDE CRYS ER 20 MEQ PO TBCR
60.0000 meq | EXTENDED_RELEASE_TABLET | Freq: Once | ORAL | Status: AC
  Administered 2018-09-26: 21:00:00 60 meq via ORAL
  Filled 2018-09-26: qty 3

## 2018-09-26 MED ORDER — HYDROCODONE-ACETAMINOPHEN 5-325 MG PO TABS: 1.0000 | ORAL_TABLET | Freq: Once | ORAL | Status: DC

## 2018-09-26 NOTE — ED Provider Notes (Addendum)
[facility] EMERGENCY DEPARTMENT Provider Note   CSN: [id number] Arrival date & time: 09/26/18  1559    History   Chief Complaint Chief Complaint  Patient presents with   Hip infection    HPI [name] is a 68 y.o. male.     HPI  68 year old male comes in a chief complaint of swelling to his hip. Patient has history of CAD, HIV, recurrent DVT and he had a hip replacement on 5/8.  He resides at a rehab facility, and the nursing staff order sent to the ER for further evaluation because there is been some drainage from his surgical site.  Patient denies any new pain.  He also denies any fevers, chills, diaphoresis.  He does indicate that he was nauseated earlier today and is having some lower quadrant abdominal pain, that is similar to his prior diverticulitis pain.  He denies any UTI-like symptoms.  Past Medical History:  Diagnosis Date   Allergy    Anxiety    Carotid artery occlusion    40-60% right ICA stenosis (09/2008)   Cataract    Chronic back pain    CLL (chronic lymphoblastic leukemia) dx 2010   Followed at [facility] q19mo, no current therapy    Clotting disorder ([place])    Coronary artery disease 2010   s/p CABG '10, sees Dr. Percival Spanish   Depression    Diverticulosis    DVT, lower extremity, recurrent ([place]) 2008, 2009   LLE, chronic anticoag since 2009   Esophagitis    Fibromyalgia    Gallstones    GERD (gastroesophageal reflux disease)    Gout    Gynecomastia, male    H/O hiatal hernia 2008   surgery   Hemorrhoids    Hepatitis A yrs ago   HIV infection ([place]) dx 1993   Hypertension    Impotence of organic origin    Myocardial infarction ([place]) 2010    x 2   Neuromuscular disorder ([place])    neuropathy   Osteoarthritis, knee    s/p B TKA   Osteoporosis    Pneumonia mrach, may, july 2017   Rheumatoid arthritis(714.0) dx 2010   MTX, follows with rheum   Seasonal allergies    Secondary syphilis 07/24/14 dx    s/p 2 wks doxy   Status post dilation of esophageal narrowing    Stroke ([place]) 1997   tia    TIA (transient ischemic attack) 1997   mild residual L mouth droop   Tubular adenoma of colon     Patient Active Problem List   Diagnosis Date Noted   Dislocation of hip prosthesis ([place])    Closed dislocation of left hip ([place]) 09/15/2018   Recurrent dislocation of hip joint prosthesis ([place]) 08/28/2018    Class: Acute   Pneumonia of both lungs due to infectious organism 08/24/2018   Status post total replacement of left hip 08/13/2018   Avascular necrosis of bone of hip, left ([place]) 08/09/2018   Eczema 07/01/2018   Candida rash of groin 07/01/2018   Tinea corporis 01/22/2018   Depression with anxiety 01/14/2018   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   Carotid artery disease ([place]) 11/13/2016   Stenosis of carotid artery 11/13/2016   Spinal stenosis of thoracolumbar region 07/02/2016   ILD (interstitial lung disease) ([place]) 09/13/2015   Immunocompromised state ([place]) 09/13/2015   PCP (pneumocystis jiroveci pneumonia) ([place]) 06/18/2015   Postinflammatory pulmonary fibrosis ([place]) 05/11/2015   GERD (gastroesophageal reflux disease)    Hereditary and idiopathic  peripheral neuropathy 09/08/2013   Erosive esophagitis 12/28/2012   Allergic rhinitis, cause unspecified 04/28/2012   Long term current use of anticoagulant therapy 02/03/2012   Hypertension    DVT, lower extremity, recurrent ([place])    Gout    Carotid artery occlusion    Hyperlipidemia with target LDL less than 100    HIV infection ([place]) 04/08/2011   Arthritis, rheumatoid ([place]) 04/08/2011   Chronic lymphoblastic leukemia 04/08/2011   Impotence of organic origin 04/02/2011   CAD (coronary artery disease) of artery bypass graft 02/14/2009   Herpes genitalis 06/22/2008    Past Surgical History:  Procedure Laterality Date   ANTERIOR HIP REVISION Left 08/17/2018   Procedure: ANTERIOR LEFT  HIP REVISION;  Surgeon: Mcarthur Rossetti, MD;  Location: [facility];  Service: Orthopedics;  Laterality: Left;   CHOLECYSTECTOMY     COLONOSCOPY WITH PROPOFOL N/A 12/28/2012   Procedure: COLONOSCOPY WITH PROPOFOL;  Surgeon: Jerene Bears, MD;  [facility];  Service: Gastroenterology;  Laterality: N/A;   CORONARY ARTERY BYPASS GRAFT  2010   triple bypass   ESOPHAGOGASTRODUODENOSCOPY (EGD) WITH PROPOFOL N/A 12/28/2012   Procedure: ESOPHAGOGASTRODUODENOSCOPY (EGD) WITH PROPOFOL;  Surgeon: Jerene Bears, MD;  [facility];  Service: Gastroenterology;  Laterality: N/A;   ESOPHAGOGASTRODUODENOSCOPY (EGD) WITH PROPOFOL N/A 03/15/2013   Procedure: ESOPHAGOGASTRODUODENOSCOPY (EGD) WITH PROPOFOL;  Surgeon: Jerene Bears, MD;  [facility];  Service: Gastroenterology;  Laterality: N/A;   ESOPHAGOGASTRODUODENOSCOPY (EGD) WITH PROPOFOL N/A 02/07/2016   Procedure: ESOPHAGOGASTRODUODENOSCOPY (EGD) WITH PROPOFOL;  Surgeon: Jerene Bears, MD;  [facility];  Service: Gastroenterology;  Laterality: N/A;   HARDWARE REMOVAL N/A 07/02/2012   Procedure: HARDWARE REMOVAL;  Surgeon: Elaina Hoops, MD;  Location: [facility];  Service: Neurosurgery;  Laterality: N/A;   HIATAL HERNIA REPAIR     wrap   HIP CLOSED REDUCTION Left 08/15/2018   Procedure: CLOSED REDUCTION HIP;  Surgeon: Newt Minion, MD;  Location: [facility];  Service: Orthopedics;  Laterality: Left;   HIP CLOSED REDUCTION Left 08/28/2018   Procedure: CLOSED REDUCTION HIP FOR RECURRENT DISLOCATION AND DRESSING CHANGE;  Surgeon: Jessy Oto, MD;  Location: [facility];  Service: Orthopedics;  Laterality: Left;   HIP CLOSED REDUCTION Left 09/15/2018   Procedure: CLOSED REDUCTION HIP DISLOCATION;  Surgeon: Mcarthur Rossetti, MD;  Location: [facility];  Service: Orthopedics;  Laterality: Left;   INGUINAL HERNIA REPAIR Bilateral    JOINT REPLACEMENT Left 1999   KNEE ARTHROPLASTY  07/22/2011   Procedure: COMPUTER ASSISTED  TOTAL KNEE ARTHROPLASTY;  Surgeon: Meredith Pel, MD;  Location: [facility];  Service: Orthopedics;  Laterality: Right;  Right total knee arthroplasty   MANDIBLE SURGERY Bilateral    tmj   REPLACEMENT TOTAL KNEE Bilateral    ring around testicle hernia reapir  184 and 1986   x 2   ROTATOR CUFF REPAIR Right    SHOULDER SURGERY Left    SPINE SURGERY  2010   "rod and screws", "failed", lopwer spine,    stent to heart x 1  2010   TONSILLECTOMY     TOTAL HIP ARTHROPLASTY Left 08/13/2018   Procedure: LEFT TOTAL HIP ARTHROPLASTY ANTERIOR APPROACH;  Surgeon: Mcarthur Rossetti, MD;  Location: [facility];  Service: Orthopedics;  Laterality: Left;   TOTAL HIP ARTHROPLASTY Left 09/17/2018   Procedure: ANTERIOR LEFT HIP REVISION;  Surgeon: Mcarthur Rossetti, MD;  Location: [facility];  Service: Orthopedics;  Laterality: Left;   UMBILICAL HERNIA REPAIR  x 1   varicose vein     stripping   VIDEO BRONCHOSCOPY Bilateral 10/16/2015   Procedure: VIDEO BRONCHOSCOPY WITHOUT FLUORO;  Surgeon: Brand Males, MD;  [facility];  Service: Cardiopulmonary;  Laterality: Bilateral;        Home Medications    Prior to Admission medications   Medication Sig Start Date End Date Taking? Authorizing Provider  acetaminophen (TYLENOL) 500 MG tablet Take 500-1,000 mg by mouth every 6 (six) hours as needed for mild pain or moderate pain.    Yes [provider]  atorvastatin (LIPITOR) 10 MG tablet Take 1 tablet (10 mg total) by mouth at bedtime. 04/05/18  Yes [name], MD  darunavir (PREZISTA) 800 MG tablet TAKE 1 TABLET (800 MG TOTAL) BY MOUTH DAILY. Patient taking differently: Take 800 mg by mouth daily.  08/06/18  Yes Campbell Riches, MD  elvitegravir-cobicistat-emtricitabine-tenofovir (GENVOYA) 150-150-200-10 MG TABS tablet Take 1 tablet by mouth daily with breakfast. 08/06/18  Yes Campbell Riches, MD  EPINEPHrine (EPIPEN 2-PAK) 0.3 mg/0.3 mL IJ SOAJ injection Inject  0.3 mg into the muscle as needed for anaphylaxis.   Yes [provider]  escitalopram (LEXAPRO) 20 MG tablet Take 20 mg by mouth daily. 07/07/18  Yes [provider]  famotidine (PEPCID) 40 MG tablet TAKE 1 TABLET BY MOUTH TWICE DAILY FOR 2 MONTHS Patient taking differently: Take 40 mg by mouth 2 (two) times daily. START date 09/22/2018 & STOP date 11/22/2018 08/06/18  Yes Pyrtle, Lajuan Lines, MD  folic acid (FOLVITE) 1 MG tablet Take 1 mg by mouth daily. 09/17/17  Yes [provider]  furosemide (LASIX) 20 MG tablet Take 1 tablet (20 mg total) by mouth 2 (two) times daily. 02/10/18  Yes [name], MD  leflunomide (ARAVA) 20 MG tablet Take 20 mg by mouth daily.  07/10/18  Yes [provider]  nitroGLYCERIN (NITROSTAT) 0.4 MG SL tablet Place 1 tablet (0.4 mg total) under the tongue every 5 (five) minutes as needed for chest pain. Patient taking differently: Place 0.4 mg under the tongue every 5 (five) minutes x 3 doses as needed for chest pain.  12/24/17 07/26/19 Yes [name], MD  ondansetron (ZOFRAN) 4 MG tablet Take 4 mg by mouth every 6 (six) hours as needed for nausea.   Yes [provider]  oxyCODONE (OXY IR/ROXICODONE) 5 MG immediate release tablet Take 5 mg by mouth every 6 (six) hours as needed for severe pain.   Yes [provider]  polyethylene glycol (MIRALAX / GLYCOLAX) 17 g packet Take 17 g by mouth daily. MIX INTO 4-8 OUNCES OF LIQUID OF CHOICE   Yes [provider]  predniSONE (DELTASONE) 5 MG tablet Take 5 mg by mouth 2 (two) times daily.  11/02/16  Yes [provider]  pregabalin (LYRICA) 200 MG capsule TAKE 1 CAPSULE BY MOUTH TWICE A DAY Patient taking differently: Take 200 mg by mouth 2 (two) times daily.  07/27/18  Yes [name], MD  REXULTI 0.25 MG TABS Take 0.25 mg by mouth daily. 09/02/18  Yes [name], MD  SAVAYSA 30 MG TABS tablet TAKE 1 TABLET (30 MG TOTAL) BY MOUTH DAILY. 08/06/18  Yes [name], MD  senna (SENOKOT) 8.6 MG TABS tablet Take 1 tablet by mouth daily.   Yes [provider]  sulfamethoxazole-trimethoprim (BACTRIM DS,SEPTRA DS) 800-160 MG tablet TAKE 1 TABLET BY MOUTH EVERY 12 (TWELVE) HOURS. 08/06/18  Yes [name], MD  valACYclovir (  VALTREX) 500 MG tablet Take 1 tablet (500 mg total) by mouth daily. 01/27/18  Yes Campbell Riches, MD  HYDROmorphone (DILAUDID) 2 MG tablet Take 1 tablet (2 mg total) by mouth every 4 (four) hours as needed for severe pain. Patient not taking: Reported on 09/26/2018 09/22/18   Mcarthur Rossetti, MD    Family History Family History  Problem Relation Age of Onset   Breast cancer Mother    Hypertension Mother    Hyperlipidemia Mother    Diabetes Mother    Prostate cancer Father    Colon polyps Father    Hyperlipidemia Father    Crohn's disease Paternal Aunt    Diabetes Maternal Grandmother    Diabetes Brother        x 3   Heart attack Brother    Heart disease Brother        x 3   Heart attack Brother    Hyperlipidemia Brother        x 3   Alcohol abuse Daughter    Drug abuse Daughter    Asthma Brother    CVA Brother    Colon cancer Neg Hx    Esophageal cancer Neg Hx    Rectal cancer Neg Hx    Stomach cancer Neg Hx     Social History Social History   Tobacco Use   Smoking status: Never Smoker   Smokeless tobacco: Never Used   Tobacco comment: occ wine  Substance Use Topics   Alcohol use: Yes    Comment: occasional wine- 1-2 per week   Drug use: No     Allergies   Golimumab; Orencia [abatacept]; Other; Peanut-containing drug products; Morphine; Oxycodone-acetaminophen; Penicillins; and Promethazine hcl   Review of Systems Review of Systems  Constitutional: Positive for activity change.  Respiratory: Negative for shortness of breath.   Cardiovascular: Negative for chest pain.  Gastrointestinal: Positive for abdominal pain, nausea and vomiting.    Musculoskeletal: Negative for myalgias.  Allergic/Immunologic: Positive for immunocompromised state.  Hematological: Bruises/bleeds easily.  All other systems reviewed and are negative.    Physical Exam Updated Vital Signs BP 122/70    Pulse 90    Temp 99.4 F (37.4 C) (Oral)    Resp (!) 23    Ht 5\' 9"  (1.753 m)    Wt 78 kg    SpO2 96%    BMI 25.40 kg/m   Physical Exam Vitals signs and nursing note reviewed.  Constitutional:      Appearance: He is well-developed.  HENT:     Head: Normocephalic and atraumatic.  Eyes:     Conjunctiva/sclera: Conjunctivae normal.     Pupils: Pupils are equal, round, and reactive to light.  Neck:     Musculoskeletal: Normal range of motion and neck supple.  Cardiovascular:     Rate and Rhythm: Normal rate and regular rhythm.  Pulmonary:     Effort: Pulmonary effort is normal.     Breath sounds: Normal breath sounds.  Abdominal:     General: Bowel sounds are normal. There is no distension.     Palpations: Abdomen is soft. There is no mass.     Tenderness: There is no abdominal tenderness. There is no guarding or rebound.  Musculoskeletal:        General: No deformity.     Comments: Patient has swelling around the surgical suturing site. There is no drainage appreciated.  No erythema, significant warmth to touch.  Skin:    General: Skin is  warm.     Findings: Rash present.  Neurological:     Mental Status: He is alert and oriented to person, place, and time.          ED Treatments / Results  Labs (all labs ordered are listed, but only abnormal results are displayed) Labs Reviewed  CBC WITH DIFFERENTIAL/PLATELET - Abnormal; Notable for the following components:      Result Value   RBC 3.24 (*)    Hemoglobin 10.1 (*)    HCT 32.0 (*)    RDW 16.3 (*)    Platelets 144 (*)    Lymphs Abs 5.2 (*)    Monocytes Absolute 0.0 (*)    All other components within normal limits  BASIC METABOLIC PANEL - Abnormal; Notable for the following  components:   Sodium 132 (*)    Potassium 3.0 (*)    Chloride 95 (*)    Glucose, Bld 128 (*)    Calcium 7.6 (*)    All other components within normal limits  C-REACTIVE PROTEIN - Abnormal; Notable for the following components:   CRP 6.7 (*)    All other components within normal limits  LACTIC ACID, PLASMA  URINALYSIS, ROUTINE W REFLEX MICROSCOPIC  LACTIC ACID, PLASMA  PATHOLOGIST SMEAR REVIEW    EKG EKG Interpretation  Date/Time:  Sunday Sep 26 2018 16:08:28 EDT Ventricular Rate:  105 PR Interval:    QRS Duration: 99 QT Interval:  336 QTC Calculation: 444 R Axis:   11 Text Interpretation:  Sinus tachycardia Probable left atrial enlargement Low voltage, precordial leads Abnormal T, consider ischemia, lateral leads No acute changes anterior T wave inversion is new Confirmed by Varney Biles [id number]) on 09/26/2018 4:14:06 PM   Radiology Ct Abdomen Pelvis Wo Contrast  Result Date: 09/26/2018 CLINICAL DATA:  Nausea, low abdominal pain EXAM: CT ABDOMEN AND PELVIS WITHOUT CONTRAST TECHNIQUE: Multidetector CT imaging of the abdomen and pelvis was performed following the standard protocol without IV contrast. COMPARISON:  None. FINDINGS: Lower chest: Coronary artery calcifications. Small left pleural effusion and associated atelectasis or consolidation. Mild bibasilar fibrosis or scarring. Hepatobiliary: No focal liver abnormality is seen. Status post cholecystectomy. No biliary dilatation. Pancreas: Unremarkable. No pancreatic ductal dilatation or surrounding inflammatory changes. Spleen: Normal in size without focal abnormality. Adrenals/Urinary Tract: Adrenal glands are unremarkable. Multiple tiny bilateral nonobstructive renal calculi. No hydronephrosis. Bladder is unremarkable. Stomach/Bowel: Stomach is within normal limits. No evidence of bowel wall thickening, distention, or inflammatory changes. Sigmoid diverticulosis. Large stool balls in the rectum. Vascular/Lymphatic: Calcific  atherosclerosis. No enlarged abdominal or pelvic lymph nodes. Reproductive: No mass or other abnormality. Other: No abdominal wall hernia or abnormality. No abdominopelvic ascites. Musculoskeletal: Extensive posterior thoracolumbar fusion with kyphosis of the lumbar spine. Postoperative findings of recent left hip total arthroplasty with overlying and surrounding air and fluid collections (series 3, image 83). IMPRESSION: 1. No acute noncontrast CT findings of the abdomen or pelvis to explain abdominal pain and nausea. 2. Postoperative findings of recent left hip total arthroplasty with overlying and surrounding air and fluid collections (series 3, image 83), likely hematoma or seroma. The presence or absence of infection is not evaluated. 3. Multiple chronic, incidental, and postoperative as detailed above. Electronically Signed   By: Eddie Candle M.D.   On: 09/26/2018 18:24   Ct Femur Left Wo Contrast  Result Date: 09/26/2018 CLINICAL DATA:  Recent left hip arthroplasty 10 days ago with new swelling and redness at the surgical site. EXAM: CT OF THE LOWER LEFT  EXTREMITY WITHOUT CONTRAST TECHNIQUE: Multidetector CT imaging of the lower left extremity was performed according to the standard protocol. COMPARISON:  Pelvic x-rays dated Sep 17, 2018. FINDINGS: Bones/Joint/Cartilage Prior left total hip arthroplasty. No evidence of hardware failure or loosening. Unchanged thinning of the acetabular wall overlying the acetabular cup, similar to immediate postoperative x-rays. No definite signet joint effusion. There are small foci of gas within the hip joint along the inferior margin of the acetabular cup. There is irregularity of the posterior acetabulum (series 8, images 124-133). Partially visualized left total knee arthroplasty. Ligaments Suboptimally assessed by CT. Muscles and Tendons Enlargement of the left iliacus muscle with areas of faint calcification. 6.7 x 3.2 x 5.4 cm fluid collection with areas of rim  calcification involving the left gluteus minimus muscle. Atrophy of the left gluteus medius and distal semimembranosus muscles. 2.7 cm lipoma in the distal left semimembranosus muscle. Soft tissues 6.7 x 3.3 x 13.3 cm fluid collection overlying the lateral hip with small foci of gas. Adjacent soft tissue stranding. IMPRESSION: 1. 6.7 x 3.3 x 13.3 cm fluid collection overlying the lateral left hip, containing small foci of gas, concerning for abscess. 2. Left total hip arthroplasty with small foci of gas within the hip joint along the inferior margin of the acetabular cup. Nearby irregularity of the posterior acetabulum. These findings may be postsurgical given lack of definite significant joint effusion, but infection is not entirely excluded. 3. Additional 6.7 x 3.2 x 5.4 cm fluid collection involving the left gluteus minimus muscle. This demonstrates areas of rim calcification and could reflect myositis ossificans. Similarly, there is enlargement of the left iliacus muscle containing areas of faint calcification, also potentially reflecting early myositis ossificans. Electronically Signed   By: Titus Dubin M.D.   On: 09/26/2018 18:28    Procedures Procedures (including critical care time)  Medications Ordered in ED Medications  sodium chloride 0.9 % bolus 500 mL (0 mLs Intravenous Stopped 09/26/18 1912)  potassium chloride SA (K-DUR) CR tablet 60 mEq (60 mEq Oral Given 09/26/18 2040)     Initial Impression / Assessment and Plan / ED Course  I have reviewed the triage vital signs and the nursing notes.  Pertinent labs & imaging results that were available during my care of the patient were reviewed by me and considered in my medical decision making (see chart for details).  Clinical Course as of Sep 25 2044  Nancy Fetter Sep 26, 2018  2033 Dr. Ninfa Linden is evaluated the patient.  He drained the seroma and applied a wound VAC.  He is going to follow-up with the patient in 1 week.  We will give him oral  potassium.  He stable for discharge.   [AN]    Clinical Course User Index [AN] Varney Biles, MD       68 year old comes in a chief complaint of lower quadrant abdominal pain and also swelling to his postsurgical wound.  He had a left-sided hip replacement done on 5 8.  Clinically it appears that he likely has a seroma.  He is on blood thinners, therefore this could also be a hematoma.  We do not see clear evidence of pus or infection.  CT scan of the femur along with CT abdomen and pelvis ordered.  The latter was ordered because patient has history of diverticulitis and HIV, he is complaining of lower quadrant abdominal pain.  CT scans did not reveal any acute findings in the abdomen.  The femur side CT scan is consistent with  seroma.  Spoke with Dr. Ninfa Linden, orthopedic surgery.  He is aware that patient has had postop complication at the wound site, and he has once drained the patient at the postop visit.  He will come and see the patient, and likely drain him.  UA added to ensure there is no UTI.  Final Clinical Impressions(s) / ED Diagnoses   Final diagnoses:  Seroma of musculoskeletal structure after musculoskeletal system procedure    ED Discharge Orders    None       Varney Biles, MD 09/26/18 Marjo Bicker    Varney Biles, MD 09/26/18 2046

## 2018-09-26 NOTE — ED Notes (Signed)
This RN attempted to give report to [employer] with no answer

## 2018-09-26 NOTE — ED Notes (Signed)
Pt been in contact with family.

## 2018-09-26 NOTE — Progress Notes (Signed)
Patient ID: [name], male   DOB: 03/29/1951, 68 y.o.   MRN: [id number] The patient is well-known to me.  He comes in to the emergency room from his skilled nursing facility out of concern of a draining left hip wound.  He is post multiple surgeries on his left hip.  His peripheral white blood cell count is normal.  On exam this appears to be consistent with a postoperative seroma.  He is on a significant blood thinner as well.  I was able to aspirate significant amount of fluid from around the soft tissue consistent with a seroma.  I expressed a significant amount of fluid after that as well from around the incision.  Incision itself is intact and nothing appears infected.  I then placed a Praveena wound VAC over the incision and got a good seal.  He can have this stay on while he is at the skilled nursing facility.  He will keep his follow-up appoint with me this coming Thursday for continued follow-up and wound check.  All question concerns were answered and addressed.  He understands this fully.  He does not appear sick and looks good overall.

## 2018-09-26 NOTE — ED Triage Notes (Signed)
Per EMS- pt here from [facility] for eval of hip infection. Pt had hip operation on the 8th to the left hip. Pt has had increase in drainage from the site. Taking oral bactrim with no relief. Pt taking 5mg  Oxycodone for pain.

## 2018-09-26 NOTE — Discharge Instructions (Addendum)
We saw in the ER for the swelling of your thigh. Dr. Ninfa Linden, orthopedic surgery has come and drained the excess fluid.  There is no signs of infection.  We have applied a wound VAC (see information) to the site and Dr. Ninfa Linden would like you to be seen in the clinic next week for reassessment.

## 2018-09-27 LAB — PATHOLOGIST SMEAR REVIEW: Path Review: REACTIVE

## 2018-09-28 ENCOUNTER — Encounter: Payer: Self-pay | Admitting: Internal Medicine

## 2018-09-28 DIAGNOSIS — R531 Weakness: Secondary | ICD-10-CM | POA: Diagnosis not present

## 2018-09-28 DIAGNOSIS — R5381 Other malaise: Secondary | ICD-10-CM | POA: Diagnosis not present

## 2018-09-28 DIAGNOSIS — E876 Hypokalemia: Secondary | ICD-10-CM | POA: Diagnosis not present

## 2018-09-28 DIAGNOSIS — T8149XA Infection following a procedure, other surgical site, initial encounter: Secondary | ICD-10-CM | POA: Diagnosis not present

## 2018-09-30 ENCOUNTER — Other Ambulatory Visit: Payer: Self-pay

## 2018-09-30 ENCOUNTER — Ambulatory Visit (INDEPENDENT_AMBULATORY_CARE_PROVIDER_SITE_OTHER): Payer: Medicare Other | Admitting: Orthopaedic Surgery

## 2018-09-30 ENCOUNTER — Telehealth: Payer: Self-pay | Admitting: Orthopedic Surgery

## 2018-09-30 ENCOUNTER — Encounter: Payer: Self-pay | Admitting: Orthopaedic Surgery

## 2018-09-30 ENCOUNTER — Telehealth: Payer: Self-pay | Admitting: Orthopaedic Surgery

## 2018-09-30 DIAGNOSIS — Z96642 Presence of left artificial hip joint: Secondary | ICD-10-CM

## 2018-09-30 NOTE — Telephone Encounter (Signed)
Blanch Media called saying pt wound vac is beeping and almost full.  She would like to know if she should leave it alone till his apt time at 2:45 or should she do something with it ?

## 2018-09-30 NOTE — Telephone Encounter (Signed)
Please advise 

## 2018-09-30 NOTE — Progress Notes (Signed)
The patient comes in today for continued follow-up status post a left total hip arthroplasty that was complicated by dislocations due to significant and severe kyphosis of the spine with a fused pelvis.  We had to eventually change the acetabulum to a more anteverted position as well as place a lipped liner.  We have him with a high offset stem.  He is in a knee immobilizer as well.  I saw him earlier this week on Sunday and placed a Praveena wound VAC to help dry the wound.  He has been convalescing at a skilled nursing facility getting therapy.  He feels like he is doing better overall.  On exam the sutures are intact over the leave them.  He still had a moderate seroma after remove the wound VAC but I was pleased overall with how the incision looks.  I was able to aspirate 100 cc of seroma fluid from the soft tissue.  I then placed new dressing.  This dressing is to remain in place for the next 5 days.  We will see him back in the office at that visit for wound check and potentially suture removal and a re-aspiration of any seroma buildup as needed.  No x-rays are needed at the next visit.  I think

## 2018-09-30 NOTE — Telephone Encounter (Signed)
If they can plug it into the wall that would be great.  It can stay on until his appointment with [place] this afternoon.

## 2018-09-30 NOTE — Care Plan (Signed)
RNCM met with patient while he was in office for appointment with MD. He was accompanied by his daughter. MD removed incisional vac that was placed over the weekend when patient was brought back to the ED by the SNF due to swelling at the incision site and increased drainage. MD drained fluid from the seroma at the Left hip incision while in the ED and placed the vac with dressing. Patient is post-op from left hip revision done on 09/17/18. He is doing well today and is currently pleased with therapy at the SNF where he is currently residing. The incisional vac has been discontinued and 100 ml of seroma aspirated by the MD today. Aquacel dressing placed over incision. No signs or symptoms of infection present. Sutures remain in place until next visit. Continue with knee immobilizer and follow up scheduled in 5 days on 10/05/18 at 9:00 am. Will possibly remove sutures at that time.

## 2018-09-30 NOTE — Telephone Encounter (Signed)
error 

## 2018-10-01 DIAGNOSIS — D649 Anemia, unspecified: Secondary | ICD-10-CM | POA: Diagnosis not present

## 2018-10-01 DIAGNOSIS — I2510 Atherosclerotic heart disease of native coronary artery without angina pectoris: Secondary | ICD-10-CM | POA: Diagnosis not present

## 2018-10-01 DIAGNOSIS — T8149XA Infection following a procedure, other surgical site, initial encounter: Secondary | ICD-10-CM | POA: Diagnosis not present

## 2018-10-01 DIAGNOSIS — E876 Hypokalemia: Secondary | ICD-10-CM | POA: Diagnosis not present

## 2018-10-05 ENCOUNTER — Encounter: Payer: Self-pay | Admitting: Orthopaedic Surgery

## 2018-10-05 ENCOUNTER — Ambulatory Visit (INDEPENDENT_AMBULATORY_CARE_PROVIDER_SITE_OTHER): Payer: Medicare Other | Admitting: Orthopaedic Surgery

## 2018-10-05 ENCOUNTER — Other Ambulatory Visit: Payer: Self-pay

## 2018-10-05 ENCOUNTER — Encounter: Payer: Self-pay | Admitting: Internal Medicine

## 2018-10-05 DIAGNOSIS — Z96642 Presence of left artificial hip joint: Secondary | ICD-10-CM

## 2018-10-05 NOTE — Progress Notes (Signed)
The patient is here for continued follow-up status post revision of the left hip secondary to several dislocations.  He is staying at a nursing facility to get therapy.  We have been draining a persistent seroma off of his left hip area.  He is on chronic blood thinners due to atrial fibrillation.  On examination is no evidence of infection at all.  I did place a few staples in one area of his incision and then drained off just a small seroma.  A lot of this is now becoming a firm hematoma.  There is no evidence of infection.  I will see him back next week for suture removal.  At that visit I would like a low AP pelvis that can be done supine.

## 2018-10-07 ENCOUNTER — Telehealth: Payer: Self-pay

## 2018-10-07 NOTE — Telephone Encounter (Signed)
Geneticist, molecular at [facility] called stated that they  Need some assistance with patients bundle insurance. He is currently on day 16 and they have some issues that need to be addressed. Please call her back [phone] ext [phone]

## 2018-10-07 NOTE — Telephone Encounter (Signed)
facility wondering if they can do therapy 6 days a week instead of 7? [phone]

## 2018-10-08 DIAGNOSIS — I2510 Atherosclerotic heart disease of native coronary artery without angina pectoris: Secondary | ICD-10-CM | POA: Diagnosis not present

## 2018-10-08 DIAGNOSIS — E876 Hypokalemia: Secondary | ICD-10-CM | POA: Diagnosis not present

## 2018-10-08 DIAGNOSIS — R531 Weakness: Secondary | ICD-10-CM | POA: Diagnosis not present

## 2018-10-08 DIAGNOSIS — D649 Anemia, unspecified: Secondary | ICD-10-CM | POA: Diagnosis not present

## 2018-10-08 NOTE — Telephone Encounter (Signed)
Call pt 

## 2018-10-08 NOTE — Telephone Encounter (Signed)
That will be fine. 

## 2018-10-09 ENCOUNTER — Encounter: Payer: Self-pay | Admitting: Internal Medicine

## 2018-10-09 ENCOUNTER — Telehealth: Payer: Self-pay | Admitting: Cardiology

## 2018-10-09 NOTE — Telephone Encounter (Signed)
IC LM advising.  ?

## 2018-10-09 NOTE — Telephone Encounter (Signed)
Spoke with patient regarding recent visit 5/26 and possible Covid exposure and offering testing.  Pt states he is currently in rehab and in not currently experiencing symptoms.  He will alert the nurses if develops symptoms. Refused testing at this time

## 2018-10-11 ENCOUNTER — Ambulatory Visit: Payer: Medicare Other | Admitting: Orthopaedic Surgery

## 2018-10-11 ENCOUNTER — Encounter: Payer: Self-pay | Admitting: Orthopaedic Surgery

## 2018-10-12 ENCOUNTER — Other Ambulatory Visit: Payer: Self-pay

## 2018-10-12 ENCOUNTER — Encounter: Payer: Self-pay | Admitting: Orthopaedic Surgery

## 2018-10-12 ENCOUNTER — Ambulatory Visit (INDEPENDENT_AMBULATORY_CARE_PROVIDER_SITE_OTHER): Payer: Medicare Other | Admitting: Orthopaedic Surgery

## 2018-10-12 DIAGNOSIS — Z87828 Personal history of other (healed) physical injury and trauma: Secondary | ICD-10-CM

## 2018-10-12 DIAGNOSIS — Z8739 Personal history of other diseases of the musculoskeletal system and connective tissue: Secondary | ICD-10-CM

## 2018-10-12 DIAGNOSIS — Z96642 Presence of left artificial hip joint: Secondary | ICD-10-CM

## 2018-10-12 NOTE — Progress Notes (Signed)
The patient comes in today for continued follow-up due to complicated left hip issue.  He has had a primary total hip arthroplasty followed by 2 revisions of the acetabular component secondary to his severe kyphosis that made positioning difficult from a dynamic standpoint.  He had 2 dislocations following surgery due to the severity of his kyphosis and is needing to even Antivert the acetabular head and even further.  He is been in a knee immobilizer left side since then.  We will let him weight-bear as tolerated.  We are seeing him today for suture removal and wound check.  He is on high-dose blood thinning medications which is created postoperative seroma.  Also having multiple surgeries is led to soft tissue issues on that left side.  On examination today there is no evidence of infection.  I removed all sutures and staples in place Steri-Strips.  There is still fullness of the soft tissue but this is more related to just swelling and hematoma in general.  I did not drain anything today.  There is no evidence of infection.  I would still like to see him back next week just for a wound check.  Dry dressing should still be applied and he will continue to adhere to wearing the knee immobilizer.  No x-rays are needed next week.  All question concerns were answered and addressed.  I did fill out information for the skilled nursing facility as well will allow him to stand in the shower to remove the knee immobilizer and shower under supervision standing.

## 2018-10-13 ENCOUNTER — Other Ambulatory Visit: Payer: Self-pay | Admitting: Internal Medicine

## 2018-10-13 DIAGNOSIS — F418 Other specified anxiety disorders: Secondary | ICD-10-CM

## 2018-10-13 NOTE — Care Plan (Signed)
RNCM met with patient while in office today for follow up with MD. He was escorted by a CNA with the SNF he is currently residing. He noted he was doing well and continues working with therapy. Sutures removed at today's visit by MD and steri-strips placed. No draining of seroma done today. MD ok with patient showering and removing knee brace if assisted and able to stand upright.  Informed patient that RNCM would call and update his daughter since she could not be at visit today. Updated Tricia on 10/13/18. Follow up with Dr. Ninfa Linden scheduled for 10/21/18 at 2:00 pm.

## 2018-10-15 DIAGNOSIS — I2510 Atherosclerotic heart disease of native coronary artery without angina pectoris: Secondary | ICD-10-CM | POA: Diagnosis not present

## 2018-10-15 DIAGNOSIS — R531 Weakness: Secondary | ICD-10-CM | POA: Diagnosis not present

## 2018-10-15 DIAGNOSIS — E876 Hypokalemia: Secondary | ICD-10-CM | POA: Diagnosis not present

## 2018-10-15 DIAGNOSIS — D649 Anemia, unspecified: Secondary | ICD-10-CM | POA: Diagnosis not present

## 2018-10-18 ENCOUNTER — Telehealth: Payer: Self-pay | Admitting: *Deleted

## 2018-10-18 NOTE — Telephone Encounter (Signed)
Ortho Bundle Call completed.

## 2018-10-18 NOTE — Care Plan (Signed)
RNCM spoke with [facility] on Friday, 10/15/2018 and they indicated that the patient was ready to be discharged back home. RNCM also spoke with patient's daughter and she confirmed that she would be able to assist as needed. MD is agreeable to plan. Spoke with patient today after being at home over the weekend and he reports he is doing well. He is able to shower and is doing light activities of daily living independently. Discussed with MD and we will hold for now on restarting HHPT until patient is seen in office later in the week. Patient is agreeable.

## 2018-10-20 ENCOUNTER — Encounter: Payer: Self-pay | Admitting: Internal Medicine

## 2018-10-21 ENCOUNTER — Other Ambulatory Visit: Payer: Self-pay | Admitting: Internal Medicine

## 2018-10-21 ENCOUNTER — Other Ambulatory Visit: Payer: Self-pay | Admitting: Infectious Diseases

## 2018-10-21 ENCOUNTER — Ambulatory Visit (INDEPENDENT_AMBULATORY_CARE_PROVIDER_SITE_OTHER): Payer: Medicare Other | Admitting: Orthopaedic Surgery

## 2018-10-21 ENCOUNTER — Other Ambulatory Visit: Payer: Self-pay

## 2018-10-21 ENCOUNTER — Encounter: Payer: Self-pay | Admitting: Orthopaedic Surgery

## 2018-10-21 DIAGNOSIS — B20 Human immunodeficiency virus [HIV] disease: Secondary | ICD-10-CM

## 2018-10-21 DIAGNOSIS — Z96642 Presence of left artificial hip joint: Secondary | ICD-10-CM

## 2018-10-21 MED ORDER — DOXYCYCLINE HYCLATE 100 MG PO TABS: 100.0000 mg | ORAL_TABLET | Freq: Two times a day (BID) | ORAL | 0 refills | Status: DC

## 2018-10-21 NOTE — Progress Notes (Signed)
The patient is here for continued follow-up after multiple surgeries on the left hip is started with a hip replacement but then due to significant dislocations had revision of the acetabular opponent twice.  On exam he still has a small opening at the top of his incision.  I express a lot of fluid from this.  I placed Bactroban ointment on this.  He is now placed Bactroban ointment on this daily after each shower.  I will put him on doxycycline for the next 2 weeks.  I would like to see him back in 2 weeks and hopefully at that point we can stop the knee immobilizer.  At 2 weeks I would like an AP pelvis and lateral of his left hip.  This can be done supine.

## 2018-10-22 DIAGNOSIS — M1A09X0 Idiopathic chronic gout, multiple sites, without tophus (tophi): Secondary | ICD-10-CM | POA: Diagnosis not present

## 2018-10-22 DIAGNOSIS — C9110 Chronic lymphocytic leukemia of B-cell type not having achieved remission: Secondary | ICD-10-CM | POA: Diagnosis not present

## 2018-10-22 DIAGNOSIS — M150 Primary generalized (osteo)arthritis: Secondary | ICD-10-CM | POA: Diagnosis not present

## 2018-10-22 DIAGNOSIS — R29818 Other symptoms and signs involving the nervous system: Secondary | ICD-10-CM | POA: Diagnosis not present

## 2018-10-22 DIAGNOSIS — J849 Interstitial pulmonary disease, unspecified: Secondary | ICD-10-CM | POA: Diagnosis not present

## 2018-10-22 DIAGNOSIS — M0609 Rheumatoid arthritis without rheumatoid factor, multiple sites: Secondary | ICD-10-CM | POA: Diagnosis not present

## 2018-10-22 DIAGNOSIS — Z21 Asymptomatic human immunodeficiency virus [HIV] infection status: Secondary | ICD-10-CM | POA: Diagnosis not present

## 2018-10-22 DIAGNOSIS — B59 Pneumocystosis: Secondary | ICD-10-CM | POA: Diagnosis not present

## 2018-10-22 DIAGNOSIS — M2550 Pain in unspecified joint: Secondary | ICD-10-CM | POA: Diagnosis not present

## 2018-10-22 NOTE — Care Plan (Signed)
RNCM met with patient and his daughter during visit with Dr. Ninfa Linden today. Patient reports he is doing well on his own at home after discharge from SNF last week. He reports he is able to make a meal, shower daily and perform activities of daily living with minimal assistance from his daughter as needed. He continues to wear the knee immobilizer as instructed by MD. Small "blister-like" area along incision of left hip opened by MD at visit today and drainage noted. Bactroban ointment and dry dressing placed on small area and instructed to clean daily and apply antibiotic ointment. Also informed by MD to not attend dental cleaning scheduled next week due to risk of infection. Will follow up in 2 weeks on 11/01/18 at 2:00 pm. MD will obtain X-rays at this visit and anticipate possible discontinuing of knee immobilizer.

## 2018-10-24 ENCOUNTER — Encounter: Payer: Self-pay | Admitting: Internal Medicine

## 2018-10-26 ENCOUNTER — Other Ambulatory Visit: Payer: Self-pay | Admitting: Internal Medicine

## 2018-10-26 DIAGNOSIS — F418 Other specified anxiety disorders: Secondary | ICD-10-CM

## 2018-10-26 MED ORDER — ESCITALOPRAM OXALATE 20 MG PO TABS: 20.0000 mg | ORAL_TABLET | Freq: Every day | ORAL | 1 refills | Status: DC

## 2018-10-26 MED ORDER — REXULTI 0.25 MG PO TABS: 1.0000 | ORAL_TABLET | Freq: Every day | ORAL | 1 refills | Status: DC

## 2018-10-27 ENCOUNTER — Telehealth: Payer: Self-pay | Admitting: Infectious Diseases

## 2018-10-27 NOTE — Telephone Encounter (Signed)
[id number] Pre-Screening Questions: 10/27/18   Do you currently have a fever (>100 F), chills or unexplained body aches? NO  Are you currently experiencing new cough, shortness of breath, sore throat, runny nose?NO    Have you recently travelled outside the state of [place] in the last 14 days? NO   Have you been in contact with someone that is currently pending confirmation of Covid19 testing or has been confirmed to have the [place] virus?  NO  **If the patient answers NO to ALL questions -  advise the patient to please call the clinic before coming to the office should any symptoms develop.

## 2018-10-28 ENCOUNTER — Other Ambulatory Visit: Payer: Medicare Other

## 2018-10-28 ENCOUNTER — Other Ambulatory Visit (HOSPITAL_COMMUNITY)
Admission: RE | Admit: 2018-10-28 | Discharge: 2018-10-28 | Disposition: A | Discharge status: Home / Self Care | Payer: Medicare Other | Source: Ambulatory Visit | Attending: Infectious Diseases | Admitting: Infectious Diseases

## 2018-10-28 ENCOUNTER — Other Ambulatory Visit: Payer: Self-pay

## 2018-10-28 DIAGNOSIS — E1142 Type 2 diabetes mellitus with diabetic polyneuropathy: Secondary | ICD-10-CM | POA: Diagnosis not present

## 2018-10-28 DIAGNOSIS — B351 Tinea unguium: Secondary | ICD-10-CM | POA: Diagnosis not present

## 2018-10-28 DIAGNOSIS — M79676 Pain in unspecified toe(s): Secondary | ICD-10-CM | POA: Diagnosis not present

## 2018-10-28 DIAGNOSIS — Z113 Encounter for screening for infections with a predominantly sexual mode of transmission: Secondary | ICD-10-CM | POA: Insufficient documentation

## 2018-10-28 DIAGNOSIS — Z21 Asymptomatic human immunodeficiency virus [HIV] infection status: Secondary | ICD-10-CM | POA: Diagnosis not present

## 2018-10-28 DIAGNOSIS — Z79899 Other long term (current) drug therapy: Secondary | ICD-10-CM

## 2018-10-28 DIAGNOSIS — L84 Corns and callosities: Secondary | ICD-10-CM | POA: Diagnosis not present

## 2018-10-29 LAB — URINE CYTOLOGY ANCILLARY ONLY
Chlamydia: NEGATIVE
Neisseria Gonorrhea: NEGATIVE

## 2018-10-29 LAB — T-HELPER CELL (CD4) - (RCID CLINIC ONLY)
CD4 % Helper T Cell: 11 % — ABNORMAL LOW (ref 33–65)
CD4 T Cell Abs: 681 /uL (ref 400–1790)

## 2018-10-31 ENCOUNTER — Ambulatory Visit: Payer: Self-pay | Admitting: Orthopaedic Surgery

## 2018-10-31 ENCOUNTER — Encounter (HOSPITAL_COMMUNITY)
Admission: AD | Disposition: A | Discharge status: Home / Self Care | Payer: Self-pay | Source: Other Acute Inpatient Hospital | Attending: Orthopaedic Surgery

## 2018-10-31 ENCOUNTER — Observation Stay (HOSPITAL_COMMUNITY): Payer: Medicare Other

## 2018-10-31 ENCOUNTER — Observation Stay (HOSPITAL_COMMUNITY): Payer: Medicare Other | Admitting: Anesthesiology

## 2018-10-31 ENCOUNTER — Inpatient Hospital Stay (HOSPITAL_COMMUNITY)
Admission: AD | Admit: 2018-10-31 | Discharge: 2018-11-05 | DRG: 467 | Disposition: A | Discharge status: Home / Self Care | Payer: Medicare Other | Source: Other Acute Inpatient Hospital | Attending: Orthopaedic Surgery | Admitting: Orthopaedic Surgery

## 2018-10-31 ENCOUNTER — Encounter (HOSPITAL_COMMUNITY): Payer: Self-pay | Admitting: Anesthesiology

## 2018-10-31 DIAGNOSIS — Y792 Prosthetic and other implants, materials and accessory orthopedic devices associated with adverse incidents: Secondary | ICD-10-CM | POA: Diagnosis not present

## 2018-10-31 DIAGNOSIS — Z888 Allergy status to other drugs, medicaments and biological substances status: Secondary | ICD-10-CM

## 2018-10-31 DIAGNOSIS — Z20828 Contact with and (suspected) exposure to other viral communicable diseases: Secondary | ICD-10-CM | POA: Diagnosis present

## 2018-10-31 DIAGNOSIS — Z951 Presence of aortocoronary bypass graft: Secondary | ICD-10-CM

## 2018-10-31 DIAGNOSIS — T84021A Dislocation of internal left hip prosthesis, initial encounter: Secondary | ICD-10-CM

## 2018-10-31 DIAGNOSIS — T84021S Dislocation of internal left hip prosthesis, sequela: Secondary | ICD-10-CM | POA: Diagnosis not present

## 2018-10-31 DIAGNOSIS — B20 Human immunodeficiency virus [HIV] disease: Secondary | ICD-10-CM | POA: Diagnosis present

## 2018-10-31 DIAGNOSIS — Z9049 Acquired absence of other specified parts of digestive tract: Secondary | ICD-10-CM

## 2018-10-31 DIAGNOSIS — Z8249 Family history of ischemic heart disease and other diseases of the circulatory system: Secondary | ICD-10-CM

## 2018-10-31 DIAGNOSIS — Z981 Arthrodesis status: Secondary | ICD-10-CM

## 2018-10-31 DIAGNOSIS — Z880 Allergy status to penicillin: Secondary | ICD-10-CM | POA: Diagnosis not present

## 2018-10-31 DIAGNOSIS — D62 Acute posthemorrhagic anemia: Secondary | ICD-10-CM | POA: Diagnosis not present

## 2018-10-31 DIAGNOSIS — Z96653 Presence of artificial knee joint, bilateral: Secondary | ICD-10-CM | POA: Diagnosis not present

## 2018-10-31 DIAGNOSIS — I10 Essential (primary) hypertension: Secondary | ICD-10-CM | POA: Diagnosis not present

## 2018-10-31 DIAGNOSIS — Z21 Asymptomatic human immunodeficiency virus [HIV] infection status: Secondary | ICD-10-CM | POA: Diagnosis not present

## 2018-10-31 DIAGNOSIS — M9702XA Periprosthetic fracture around internal prosthetic left hip joint, initial encounter: Secondary | ICD-10-CM | POA: Diagnosis not present

## 2018-10-31 DIAGNOSIS — I252 Old myocardial infarction: Secondary | ICD-10-CM | POA: Diagnosis not present

## 2018-10-31 DIAGNOSIS — F419 Anxiety disorder, unspecified: Secondary | ICD-10-CM | POA: Diagnosis present

## 2018-10-31 DIAGNOSIS — I2510 Atherosclerotic heart disease of native coronary artery without angina pectoris: Secondary | ICD-10-CM | POA: Diagnosis present

## 2018-10-31 DIAGNOSIS — Z89611 Acquired absence of right leg above knee: Secondary | ICD-10-CM | POA: Diagnosis not present

## 2018-10-31 DIAGNOSIS — M810 Age-related osteoporosis without current pathological fracture: Secondary | ICD-10-CM | POA: Diagnosis not present

## 2018-10-31 DIAGNOSIS — T84028A Dislocation of other internal joint prosthesis, initial encounter: Secondary | ICD-10-CM

## 2018-10-31 DIAGNOSIS — J302 Other seasonal allergic rhinitis: Secondary | ICD-10-CM | POA: Diagnosis present

## 2018-10-31 DIAGNOSIS — Z885 Allergy status to narcotic agent status: Secondary | ICD-10-CM

## 2018-10-31 DIAGNOSIS — Z96642 Presence of left artificial hip joint: Secondary | ICD-10-CM | POA: Diagnosis not present

## 2018-10-31 DIAGNOSIS — Z419 Encounter for procedure for purposes other than remedying health state, unspecified: Secondary | ICD-10-CM

## 2018-10-31 DIAGNOSIS — K219 Gastro-esophageal reflux disease without esophagitis: Secondary | ICD-10-CM | POA: Diagnosis not present

## 2018-10-31 DIAGNOSIS — Z89612 Acquired absence of left leg above knee: Secondary | ICD-10-CM

## 2018-10-31 DIAGNOSIS — I509 Heart failure, unspecified: Secondary | ICD-10-CM | POA: Diagnosis not present

## 2018-10-31 DIAGNOSIS — R52 Pain, unspecified: Secondary | ICD-10-CM | POA: Diagnosis not present

## 2018-10-31 DIAGNOSIS — M797 Fibromyalgia: Secondary | ICD-10-CM | POA: Diagnosis present

## 2018-10-31 DIAGNOSIS — T84020A Dislocation of internal right hip prosthesis, initial encounter: Secondary | ICD-10-CM

## 2018-10-31 DIAGNOSIS — M069 Rheumatoid arthritis, unspecified: Secondary | ICD-10-CM | POA: Diagnosis not present

## 2018-10-31 DIAGNOSIS — S73005A Unspecified dislocation of left hip, initial encounter: Secondary | ICD-10-CM | POA: Diagnosis not present

## 2018-10-31 DIAGNOSIS — Z91018 Allergy to other foods: Secondary | ICD-10-CM

## 2018-10-31 DIAGNOSIS — Z79899 Other long term (current) drug therapy: Secondary | ICD-10-CM | POA: Diagnosis not present

## 2018-10-31 DIAGNOSIS — C9110 Chronic lymphocytic leukemia of B-cell type not having achieved remission: Secondary | ICD-10-CM | POA: Diagnosis not present

## 2018-10-31 DIAGNOSIS — Z96649 Presence of unspecified artificial hip joint: Secondary | ICD-10-CM

## 2018-10-31 DIAGNOSIS — T07XXXA Unspecified multiple injuries, initial encounter: Secondary | ICD-10-CM | POA: Diagnosis not present

## 2018-10-31 DIAGNOSIS — Z833 Family history of diabetes mellitus: Secondary | ICD-10-CM

## 2018-10-31 DIAGNOSIS — Z91010 Allergy to peanuts: Secondary | ICD-10-CM

## 2018-10-31 DIAGNOSIS — Z7952 Long term (current) use of systemic steroids: Secondary | ICD-10-CM

## 2018-10-31 DIAGNOSIS — M109 Gout, unspecified: Secondary | ICD-10-CM | POA: Diagnosis not present

## 2018-10-31 DIAGNOSIS — Z87828 Personal history of other (healed) physical injury and trauma: Secondary | ICD-10-CM

## 2018-10-31 HISTORY — PX: HIP CLOSED REDUCTION: SHX983

## 2018-10-31 LAB — SARS CORONAVIRUS 2 BY RT PCR (HOSPITAL ORDER, PERFORMED IN ~~LOC~~ HOSPITAL LAB): SARS Coronavirus 2: NEGATIVE

## 2018-10-31 SURGERY — CLOSED REDUCTION, HIP
Anesthesia: General | Laterality: Left

## 2018-10-31 MED ORDER — VALACYCLOVIR HCL 500 MG PO TABS
500.0000 mg | ORAL_TABLET | Freq: Every day | ORAL | Status: DC
  Administered 2018-11-01 – 2018-11-05 (×4): 500 mg via ORAL
  Filled 2018-10-31 (×5): qty 1

## 2018-10-31 MED ORDER — SUGAMMADEX SODIUM 200 MG/2ML IV SOLN
INTRAVENOUS | Status: DC | PRN
  Administered 2018-10-31: 200 mg via INTRAVENOUS

## 2018-10-31 MED ORDER — SORBITOL 70 % SOLN: 30.0000 mL | Freq: Every day | Status: DC | PRN

## 2018-10-31 MED ORDER — PROPOFOL 10 MG/ML IV BOLUS
INTRAVENOUS | Status: DC | PRN
  Administered 2018-10-31: 80 mg via INTRAVENOUS
  Administered 2018-10-31: 20 mg via INTRAVENOUS

## 2018-10-31 MED ORDER — ONDANSETRON HCL 4 MG PO TABS: 4.0000 mg | ORAL_TABLET | Freq: Four times a day (QID) | ORAL | Status: DC | PRN

## 2018-10-31 MED ORDER — FOLIC ACID 1 MG PO TABS
1.0000 mg | ORAL_TABLET | Freq: Every day | ORAL | Status: DC
  Administered 2018-11-01 – 2018-11-05 (×4): 1 mg via ORAL
  Filled 2018-10-31 (×4): qty 1

## 2018-10-31 MED ORDER — ELVITEG-COBIC-EMTRICIT-TENOFAF 150-150-200-10 MG PO TABS
1.0000 | ORAL_TABLET | Freq: Every day | ORAL | Status: DC
  Administered 2018-11-01 – 2018-11-05 (×4): 1 via ORAL
  Filled 2018-10-31 (×5): qty 1

## 2018-10-31 MED ORDER — SODIUM CHLORIDE 0.9 % IV SOLN: INTRAVENOUS | Status: DC

## 2018-10-31 MED ORDER — HYDROMORPHONE HCL 1 MG/ML IJ SOLN: 0.5000 mg | INTRAMUSCULAR | Status: DC | PRN

## 2018-10-31 MED ORDER — ONDANSETRON HCL 4 MG/2ML IJ SOLN
INTRAMUSCULAR | Status: AC
  Filled 2018-10-31: qty 2

## 2018-10-31 MED ORDER — METHOCARBAMOL 1000 MG/10ML IJ SOLN
500.0000 mg | Freq: Four times a day (QID) | INTRAVENOUS | Status: DC | PRN
  Filled 2018-10-31: qty 5

## 2018-10-31 MED ORDER — METHOCARBAMOL 500 MG IVPB - SIMPLE MED: 500.0000 mg | Freq: Four times a day (QID) | INTRAVENOUS | Status: DC | PRN

## 2018-10-31 MED ORDER — SUCCINYLCHOLINE CHLORIDE 200 MG/10ML IV SOSY
PREFILLED_SYRINGE | INTRAVENOUS | Status: AC
  Filled 2018-10-31: qty 10

## 2018-10-31 MED ORDER — FAMOTIDINE 40 MG PO TABS
40.0000 mg | ORAL_TABLET | Freq: Two times a day (BID) | ORAL | Status: DC
  Administered 2018-11-01 – 2018-11-05 (×8): 40 mg via ORAL
  Filled 2018-10-31 (×9): qty 1

## 2018-10-31 MED ORDER — FENTANYL CITRATE (PF) 100 MCG/2ML IJ SOLN: 25.0000 ug | INTRAMUSCULAR | Status: DC | PRN

## 2018-10-31 MED ORDER — PROPOFOL 10 MG/ML IV BOLUS
INTRAVENOUS | Status: AC
  Filled 2018-10-31: qty 20

## 2018-10-31 MED ORDER — METOCLOPRAMIDE HCL 5 MG PO TABS: 5.0000 mg | ORAL_TABLET | Freq: Three times a day (TID) | ORAL | Status: DC | PRN

## 2018-10-31 MED ORDER — ONDANSETRON HCL 4 MG/2ML IJ SOLN: 4.0000 mg | Freq: Four times a day (QID) | INTRAMUSCULAR | Status: DC | PRN

## 2018-10-31 MED ORDER — MORPHINE SULFATE (PF) 2 MG/ML IV SOLN: 0.5000 mg | INTRAVENOUS | Status: DC | PRN

## 2018-10-31 MED ORDER — ATORVASTATIN CALCIUM 10 MG PO TABS
10.0000 mg | ORAL_TABLET | Freq: Every day | ORAL | Status: DC
  Administered 2018-11-01 – 2018-11-04 (×5): 10 mg via ORAL
  Filled 2018-10-31 (×5): qty 1

## 2018-10-31 MED ORDER — LIDOCAINE 2% (20 MG/ML) 5 ML SYRINGE
INTRAMUSCULAR | Status: AC
  Filled 2018-10-31: qty 5

## 2018-10-31 MED ORDER — FUROSEMIDE 20 MG PO TABS
20.0000 mg | ORAL_TABLET | Freq: Two times a day (BID) | ORAL | Status: DC
  Administered 2018-11-01 – 2018-11-05 (×8): 20 mg via ORAL
  Filled 2018-10-31 (×8): qty 1

## 2018-10-31 MED ORDER — ESMOLOL HCL 100 MG/10ML IV SOLN
INTRAVENOUS | Status: AC
  Filled 2018-10-31: qty 10

## 2018-10-31 MED ORDER — ACETAMINOPHEN 325 MG PO TABS: 325.0000 mg | ORAL_TABLET | Freq: Four times a day (QID) | ORAL | Status: DC | PRN

## 2018-10-31 MED ORDER — METHOCARBAMOL 500 MG PO TABS: 500.0000 mg | ORAL_TABLET | Freq: Four times a day (QID) | ORAL | Status: DC | PRN

## 2018-10-31 MED ORDER — LACTATED RINGERS IV SOLN
INTRAVENOUS | Status: DC | PRN
  Administered 2018-10-31: 21:00:00 via INTRAVENOUS

## 2018-10-31 MED ORDER — ESCITALOPRAM OXALATE 10 MG PO TABS
20.0000 mg | ORAL_TABLET | Freq: Every day | ORAL | Status: DC
  Administered 2018-11-01 – 2018-11-05 (×4): 20 mg via ORAL
  Filled 2018-10-31 (×4): qty 2

## 2018-10-31 MED ORDER — POLYETHYLENE GLYCOL 3350 17 G PO PACK: 17.0000 g | PACK | Freq: Every day | ORAL | Status: DC | PRN

## 2018-10-31 MED ORDER — DEXAMETHASONE SODIUM PHOSPHATE 10 MG/ML IJ SOLN
INTRAMUSCULAR | Status: AC
  Filled 2018-10-31: qty 1

## 2018-10-31 MED ORDER — SULFAMETHOXAZOLE-TRIMETHOPRIM 800-160 MG PO TABS
1.0000 | ORAL_TABLET | Freq: Two times a day (BID) | ORAL | Status: DC
  Administered 2018-11-01 (×2): 1 via ORAL
  Filled 2018-10-31 (×2): qty 1

## 2018-10-31 MED ORDER — DOCUSATE SODIUM 100 MG PO CAPS: 100.0000 mg | ORAL_CAPSULE | Freq: Two times a day (BID) | ORAL | Status: DC

## 2018-10-31 MED ORDER — SUCCINYLCHOLINE CHLORIDE 20 MG/ML IJ SOLN
INTRAMUSCULAR | Status: DC | PRN
  Administered 2018-10-31: 100 mg via INTRAVENOUS

## 2018-10-31 MED ORDER — SODIUM CHLORIDE 0.9 % IV SOLN
INTRAVENOUS | Status: DC | PRN
  Administered 2018-10-31: 50 ug/min via INTRAVENOUS

## 2018-10-31 MED ORDER — ONDANSETRON HCL 4 MG/2ML IJ SOLN
INTRAMUSCULAR | Status: DC | PRN
  Administered 2018-10-31: 4 mg via INTRAVENOUS

## 2018-10-31 MED ORDER — MAGNESIUM CITRATE PO SOLN: 1.0000 | Freq: Once | ORAL | Status: DC | PRN

## 2018-10-31 MED ORDER — FENTANYL CITRATE (PF) 250 MCG/5ML IJ SOLN
INTRAMUSCULAR | Status: AC
  Filled 2018-10-31: qty 5

## 2018-10-31 MED ORDER — EPINEPHRINE (ANAPHYLAXIS) 1 MG/ML IJ SOLN
0.3000 mg | INTRAMUSCULAR | Status: DC | PRN
  Filled 2018-10-31: qty 0.3

## 2018-10-31 MED ORDER — HYDROCODONE-ACETAMINOPHEN 7.5-325 MG PO TABS: 1.0000 | ORAL_TABLET | ORAL | Status: DC | PRN

## 2018-10-31 MED ORDER — DIPHENHYDRAMINE HCL 12.5 MG/5ML PO ELIX: 12.5000 mg | ORAL_SOLUTION | ORAL | Status: DC | PRN

## 2018-10-31 MED ORDER — ROCURONIUM BROMIDE 10 MG/ML (PF) SYRINGE
PREFILLED_SYRINGE | INTRAVENOUS | Status: AC
  Filled 2018-10-31: qty 10

## 2018-10-31 MED ORDER — METOCLOPRAMIDE HCL 5 MG/ML IJ SOLN: 5.0000 mg | Freq: Three times a day (TID) | INTRAMUSCULAR | Status: DC | PRN

## 2018-10-31 MED ORDER — DOXYCYCLINE HYCLATE 100 MG PO TABS
100.0000 mg | ORAL_TABLET | Freq: Two times a day (BID) | ORAL | Status: DC
  Administered 2018-11-01 – 2018-11-05 (×8): 100 mg via ORAL
  Filled 2018-10-31 (×8): qty 1

## 2018-10-31 MED ORDER — ACETAMINOPHEN 500 MG PO TABS: 500.0000 mg | ORAL_TABLET | Freq: Four times a day (QID) | ORAL | Status: AC

## 2018-10-31 MED ORDER — PREGABALIN 100 MG PO CAPS
200.0000 mg | ORAL_CAPSULE | Freq: Two times a day (BID) | ORAL | Status: DC
  Administered 2018-11-01 – 2018-11-05 (×9): 200 mg via ORAL
  Filled 2018-10-31 (×9): qty 2

## 2018-10-31 MED ORDER — LEFLUNOMIDE 20 MG PO TABS
20.0000 mg | ORAL_TABLET | Freq: Every day | ORAL | Status: DC
  Administered 2018-11-01 – 2018-11-05 (×4): 20 mg via ORAL
  Filled 2018-10-31 (×5): qty 1

## 2018-10-31 MED ORDER — ROCURONIUM BROMIDE 100 MG/10ML IV SOLN
INTRAVENOUS | Status: DC | PRN
  Administered 2018-10-31: 40 mg via INTRAVENOUS

## 2018-10-31 MED ORDER — HYDROCODONE-ACETAMINOPHEN 5-325 MG PO TABS
1.0000 | ORAL_TABLET | ORAL | Status: DC | PRN
  Administered 2018-10-31: 1 via ORAL
  Filled 2018-10-31: qty 2

## 2018-10-31 MED ORDER — EDOXABAN TOSYLATE 30 MG PO TABS
30.0000 mg | ORAL_TABLET | ORAL | Status: DC
  Administered 2018-11-01: 30 mg via ORAL
  Filled 2018-10-31: qty 1

## 2018-10-31 MED ORDER — PREDNISONE 5 MG PO TABS
5.0000 mg | ORAL_TABLET | Freq: Two times a day (BID) | ORAL | Status: DC
  Administered 2018-11-01 – 2018-11-05 (×8): 5 mg via ORAL
  Filled 2018-10-31 (×8): qty 1

## 2018-10-31 MED ORDER — DEXAMETHASONE SODIUM PHOSPHATE 10 MG/ML IJ SOLN
INTRAMUSCULAR | Status: DC | PRN
  Administered 2018-10-31: 5 mg via INTRAVENOUS

## 2018-10-31 MED ORDER — DIPHENHYDRAMINE HCL 12.5 MG/5ML PO ELIX: 25.0000 mg | ORAL_SOLUTION | ORAL | Status: DC | PRN

## 2018-10-31 MED ORDER — EPHEDRINE 5 MG/ML INJ
INTRAVENOUS | Status: AC
  Filled 2018-10-31: qty 10

## 2018-10-31 MED ORDER — ESMOLOL HCL 100 MG/10ML IV SOLN
INTRAVENOUS | Status: DC | PRN
  Administered 2018-10-31: 10 mg via INTRAVENOUS

## 2018-10-31 MED ORDER — DARUNAVIR ETHANOLATE 800 MG PO TABS
800.0000 mg | ORAL_TABLET | Freq: Every day | ORAL | Status: DC
  Administered 2018-11-01 – 2018-11-05 (×4): 800 mg via ORAL
  Filled 2018-10-31 (×5): qty 1

## 2018-10-31 MED ORDER — DOCUSATE SODIUM 100 MG PO CAPS
100.0000 mg | ORAL_CAPSULE | Freq: Two times a day (BID) | ORAL | Status: DC
  Administered 2018-11-01: 100 mg via ORAL
  Filled 2018-10-31: qty 1

## 2018-10-31 MED ORDER — SENNA 8.6 MG PO TABS
1.0000 | ORAL_TABLET | Freq: Every day | ORAL | Status: DC
  Administered 2018-11-03: 8.6 mg via ORAL
  Filled 2018-10-31 (×2): qty 1

## 2018-10-31 MED ORDER — POLYETHYLENE GLYCOL 3350 17 G PO PACK
17.0000 g | PACK | Freq: Every day | ORAL | Status: DC
  Filled 2018-10-31 (×2): qty 1

## 2018-10-31 MED ORDER — FENTANYL CITRATE (PF) 100 MCG/2ML IJ SOLN
INTRAMUSCULAR | Status: DC | PRN
  Administered 2018-10-31: 50 ug via INTRAVENOUS

## 2018-10-31 MED ORDER — MIDAZOLAM HCL 2 MG/2ML IJ SOLN
INTRAMUSCULAR | Status: AC
  Filled 2018-10-31: qty 2

## 2018-10-31 MED ORDER — HYDROCODONE-ACETAMINOPHEN 5-325 MG PO TABS: 1.0000 | ORAL_TABLET | ORAL | Status: DC | PRN

## 2018-10-31 MED ORDER — PHENYLEPHRINE 40 MCG/ML (10ML) SYRINGE FOR IV PUSH (FOR BLOOD PRESSURE SUPPORT)
PREFILLED_SYRINGE | INTRAVENOUS | Status: AC
  Filled 2018-10-31: qty 10

## 2018-10-31 MED ORDER — BREXPIPRAZOLE 0.25 MG PO TABS
0.2500 mg | ORAL_TABLET | Freq: Every day | ORAL | Status: DC
  Administered 2018-11-01 – 2018-11-05 (×4): 0.25 mg via ORAL
  Filled 2018-10-31 (×5): qty 1

## 2018-10-31 MED ORDER — PHENYLEPHRINE HCL (PRESSORS) 10 MG/ML IV SOLN
INTRAVENOUS | Status: DC | PRN
  Administered 2018-10-31 (×2): 80 ug via INTRAVENOUS

## 2018-10-31 MED ORDER — ACETAMINOPHEN 500 MG PO TABS: 500.0000 mg | ORAL_TABLET | Freq: Four times a day (QID) | ORAL | Status: DC

## 2018-10-31 MED ORDER — NITROGLYCERIN 0.4 MG SL SUBL: 0.4000 mg | SUBLINGUAL_TABLET | SUBLINGUAL | Status: DC | PRN

## 2018-10-31 MED ORDER — LIDOCAINE 2% (20 MG/ML) 5 ML SYRINGE
INTRAMUSCULAR | Status: DC | PRN
  Administered 2018-10-31: 100 mg via INTRAVENOUS

## 2018-10-31 MED ORDER — SODIUM CHLORIDE 0.9 % IV SOLN
INTRAVENOUS | Status: DC
  Administered 2018-10-31 – 2018-11-01 (×4): via INTRAVENOUS

## 2018-10-31 NOTE — OR Nursing (Signed)
Failed to put hip back while on patient's bed.  Moved to [place] table at 2115

## 2018-10-31 NOTE — Anesthesia Procedure Notes (Signed)
Procedure Name: Intubation Date/Time: 10/31/2018 8:58 PM Performed by: Suzy Bouchard, CRNA Pre-anesthesia Checklist: Patient identified, Emergency Drugs available, Suction available, Patient being monitored and Timeout performed Patient Re-evaluated:Patient Re-evaluated prior to induction Oxygen Delivery Method: Circle system utilized Preoxygenation: Pre-oxygenation with 100% oxygen Induction Type: IV induction and Rapid sequence Laryngoscope Size: Miller and 2 Grade View: Grade I Tube type: Oral Tube size: 7.5 mm Number of attempts: 1 Airway Equipment and Method: Stylet Placement Confirmation: ETT inserted through vocal cords under direct vision,  positive ETCO2 and breath sounds checked- equal and bilateral Secured at: 23 cm Tube secured with: Tape Dental Injury: Teeth and Oropharynx as per pre-operative assessment

## 2018-10-31 NOTE — Op Note (Signed)
   Date of Surgery: 10/31/2018  INDICATIONS: [name] is a 68 y.o.-year-old male with a left prosthetic hip dislocation;  The patient did consent to the procedure after discussion of the risks and benefits.  PREOPERATIVE DIAGNOSIS: Left prosthetic hip dislocation, anterior  POSTOPERATIVE DIAGNOSIS: Same.  PROCEDURE: Closed reduction of left total hip replacement  SURGEON: N. Eduard Roux, M.D.  ASSIST: none  ANESTHESIA:  general  IV FLUIDS AND URINE: See anesthesia.  ESTIMATED BLOOD LOSS: none  IMPLANTS: none  DRAINS: none  COMPLICATIONS: see description of procedure.  DESCRIPTION OF PROCEDURE: The patient was brought to the operating room and initially left on the hospital bed.  A timeout was performed.  No antibiotics were indicated.  I first took orthogonal plain films to fully describe the dislocation.  I attempted a gentle reduction of the left hip for a typical posterior hip dislocation but this was unsuccessful and given the fact that his leg was in a frog-leg position I felt that his hip may be unstable anteriorly.  Therefore he was then moved over to the [place] bed and I performed a reduction of the hip by placing the left leg in traction and internal rotation.  Fluoroscopy was used to confirm concentric reduction of the hip on both the AP and crosstable lateral views.  He was then moved back over to his hospital bed and placed in a hip abduction pillow.  He tolerated the procedure well had no immediate complications.  POSTOPERATIVE PLAN: Patient will be placed on anterior hip precautions.  Dr. Ninfa Linden will see the patient in the morning for discussion of further treatment.  Azucena Cecil, MD [place] [phone] 10:02 PM

## 2018-10-31 NOTE — H&P (Signed)
PREOPERATIVE H&P  Chief Complaint: LEFT TOTAL HIP DISLOCATION  HPI: [name] is a 68 y.o. male who presents for closed reduction of LEFT TOTAL HIP DISLOCATION.  He has had a complicated course of multiple dislocations since his index surgery.  He reports that he was sitting in chair at a restaurant for lunch and felt it pop out.  He was not in the process of transitioning to standing, etc.  He refused attempted closed reduction under conscious sedation by the EDP at [facility].  He has been transferred to [facility] for closed reduction in the OR.  He denies any changes in medical history.  Past Medical History:  Diagnosis Date  . Allergy   . Anxiety   . Carotid artery occlusion    40-60% right ICA stenosis (09/2008)  . Cataract   . Chronic back pain   . CLL (chronic lymphoblastic leukemia) dx 2010   Followed at [facility] q70mo, no current therapy   . Clotting disorder ([place])   . Coronary artery disease 2010   s/p CABG '10, sees Dr. Percival Spanish  . Depression   . Diverticulosis   . DVT, lower extremity, recurrent ([place]) 2008, 2009   LLE, chronic anticoag since 2009  . Esophagitis   . Fibromyalgia   . Gallstones   . GERD (gastroesophageal reflux disease)   . Gout   . Gynecomastia, male   . H/O hiatal hernia 2008   surgery  . Hemorrhoids   . Hepatitis A yrs ago  . HIV infection ([place]) dx 1993  . Hypertension   . Impotence of organic origin   . Myocardial infarction ([place]) 2010    x 2  . Neuromuscular disorder ([facility])    neuropathy  . Osteoarthritis, knee    s/p B TKA  . Osteoporosis   . Pneumonia mrach, may, july 2017  . Rheumatoid arthritis(714.0) dx 2010   MTX, follows with rheum  . Seasonal allergies   . Secondary syphilis 07/24/14 dx   s/p 2 wks doxy  . Status post dilation of esophageal narrowing   . Stroke ([place]) [place]   . TIA (transient ischemic attack) 1997   mild residual L mouth droop  . Tubular adenoma of colon    Past Surgical History:  Procedure  Laterality Date  . ANTERIOR HIP REVISION Left 08/17/2018   Procedure: ANTERIOR LEFT HIP REVISION;  Surgeon: Mcarthur Rossetti, MD;  Location: [facility];  Service: Orthopedics;  Laterality: Left;  . CHOLECYSTECTOMY    . COLONOSCOPY WITH PROPOFOL N/A 12/28/2012   Procedure: COLONOSCOPY WITH PROPOFOL;  Surgeon: Jerene Bears, MD;  Location: [facility];  Service: Gastroenterology;  Laterality: N/A;  . CORONARY ARTERY BYPASS GRAFT  2010   triple bypass  . ESOPHAGOGASTRODUODENOSCOPY (EGD) WITH PROPOFOL N/A 12/28/2012   Procedure: ESOPHAGOGASTRODUODENOSCOPY (EGD) WITH PROPOFOL;  Surgeon: Jerene Bears, MD;  Location: [facility];  Service: Gastroenterology;  Laterality: N/A;  . ESOPHAGOGASTRODUODENOSCOPY (EGD) WITH PROPOFOL N/A 03/15/2013   Procedure: ESOPHAGOGASTRODUODENOSCOPY (EGD) WITH PROPOFOL;  Surgeon: Jerene Bears, MD;  [facility];  Service: Gastroenterology;  Laterality: N/A;  . ESOPHAGOGASTRODUODENOSCOPY (EGD) WITH PROPOFOL N/A 02/07/2016   Procedure: ESOPHAGOGASTRODUODENOSCOPY (EGD) WITH PROPOFOL;  Surgeon: Jerene Bears, MD;  [facility]: [facility];  Service: Gastroenterology;  Laterality: N/A;  . HARDWARE REMOVAL N/A 07/02/2012   Procedure: HARDWARE REMOVAL;  Surgeon: Elaina Hoops, MD;  Location: [place] [place] [facility];  Service: Neurosurgery;  Laterality: N/A;  . HIATAL HERNIA REPAIR  wrap  . HIP CLOSED REDUCTION Left 08/15/2018   Procedure: CLOSED REDUCTION HIP;  Surgeon: Newt Minion, MD;  Location: [facility];  Service: Orthopedics;  Laterality: Left;  . HIP CLOSED REDUCTION Left 08/28/2018   Procedure: CLOSED REDUCTION HIP FOR RECURRENT DISLOCATION AND DRESSING CHANGE;  Surgeon: Jessy Oto, MD;  Location: [facility];  Service: Orthopedics;  Laterality: Left;  . HIP CLOSED REDUCTION Left 09/15/2018   Procedure: CLOSED REDUCTION HIP DISLOCATION;  Surgeon: Mcarthur Rossetti, MD;  Location: [facility];  Service: Orthopedics;  Laterality: Left;  . INGUINAL HERNIA REPAIR Bilateral   . JOINT  REPLACEMENT Left 1999  . KNEE ARTHROPLASTY  07/22/2011   Procedure: COMPUTER ASSISTED TOTAL KNEE ARTHROPLASTY;  Surgeon: Meredith Pel, MD;  Location: [facility];  Service: Orthopedics;  Laterality: Right;  Right total knee arthroplasty  . MANDIBLE SURGERY Bilateral    tmj  . REPLACEMENT TOTAL KNEE Bilateral   . ring around testicle hernia reapir  184 and 1986   x 2  . ROTATOR CUFF REPAIR Right   . SHOULDER SURGERY Left   . SPINE SURGERY  2010   "rod and screws", "failed", lopwer spine,   . stent to heart x 1  2010  . TONSILLECTOMY    . TOTAL HIP ARTHROPLASTY Left 08/13/2018   Procedure: LEFT TOTAL HIP ARTHROPLASTY ANTERIOR APPROACH;  Surgeon: Mcarthur Rossetti, MD;  Location: [facility];  Service: Orthopedics;  Laterality: Left;  . TOTAL HIP ARTHROPLASTY Left 09/17/2018   Procedure: ANTERIOR LEFT HIP REVISION;  Surgeon: Mcarthur Rossetti, MD;  Location: [facility];  Service: Orthopedics;  Laterality: Left;  . UMBILICAL HERNIA REPAIR     x 1  . varicose vein     stripping  . VIDEO BRONCHOSCOPY Bilateral 10/16/2015   Procedure: VIDEO BRONCHOSCOPY WITHOUT FLUORO;  Surgeon: Brand Males, MD;  [facility];  Service: Cardiopulmonary;  Laterality: Bilateral;   Social History   Socioeconomic History  . Marital status: Widowed    Spouse name: Not on file  . Number of children: 3  . Years of education: Not on file  . Highest education level: Bachelor's degree (e.g., BA, AB, BS)  Occupational History  . Occupation: retired  Scientific laboratory technician  . Financial resource strain: Not on file  . Food insecurity    Worry: Not on file    Inability: Not on file  . Transportation needs    Medical: Not on file    Non-medical: Not on file  Tobacco Use  . Smoking status: Never Smoker  . Smokeless tobacco: Never Used  . Tobacco comment: occ wine  Substance and Sexual Activity  . Alcohol use: Yes    Comment: occasional wine- 1-2 per week  . Drug use: No  . Sexual activity: Not Currently     Comment: declined condoms  Lifestyle  . Physical activity    Days per week: Not on file    Minutes per session: Not on file  . Stress: Not on file  Relationships  . Social Herbalist on phone: Not on file    Gets together: Not on file    Attends religious service: Not on file    Active member of club or organization: Not on file    Attends meetings of clubs or organizations: Not on file    Relationship status: Not on file  Other Topics Concern  . Not on file  Social History Narrative   Patient is right-handed. He is a  widower. He lives alone in a single level home. He is active around his home. He drinks 2-3 cups of coffee a day.   Family History  Problem Relation Age of Onset  . Breast cancer Mother   . Hypertension Mother   . Hyperlipidemia Mother   . Diabetes Mother   . Prostate cancer Father   . Colon polyps Father   . Hyperlipidemia Father   . Crohn's disease Paternal Aunt   . Diabetes Maternal Grandmother   . Diabetes Brother        x 3  . Heart attack Brother   . Heart disease Brother        x 3  . Heart attack Brother   . Hyperlipidemia Brother        x 3  . Alcohol abuse Daughter   . Drug abuse Daughter   . Asthma Brother   . CVA Brother   . Colon cancer Neg Hx   . Esophageal cancer Neg Hx   . Rectal cancer Neg Hx   . Stomach cancer Neg Hx    Allergies  Allergen Reactions  . Golimumab Anaphylaxis    Simponi ARIA  . Orencia [Abatacept] Anaphylaxis  . Other Anaphylaxis and Hives    Pecans  . Peanut-Containing Drug Products Anaphylaxis, Hives and Swelling    Swelling of throat  . Morphine Other (See Comments)    Severe headache- Can tolerate Dilaudid, however  . Oxycodone-Acetaminophen Other (See Comments)    Headache  . Penicillins Rash and Other (See Comments)    FLUSHED & RED, also Has patient had a PCN reaction causing immediate rash, facial/tongue/throat swelling, SOB or lightheadedness with hypotension: Yes Has patient had a PCN  reaction causing severe rash involving mucus membranes or skin necrosis: No Has patient had a PCN reaction that required hospitalization: No Has patient had a PCN reaction occurring within the last 10 years: No If all of the above answers are "NO", then may proceed with Cephalosporin use  . Promethazine Hcl Other (See Comments)    Makes him feel "drunk" at higher strengths   Prior to Admission medications   Medication Sig Start Date End Date Taking? Authorizing Provider  acetaminophen (TYLENOL) 500 MG tablet Take 500-1,000 mg by mouth every 6 (six) hours as needed for mild pain or moderate pain.     [provider]  atorvastatin (LIPITOR) 10 MG tablet Take 1 tablet (10 mg total) by mouth at bedtime. 04/05/18   [name], MD  Brexpiprazole (REXULTI) 0.25 MG TABS Take 1 tablet by mouth daily. 10/26/18   [name], MD  doxycycline (VIBRA-TABS) 100 MG tablet Take 1 tablet (100 mg total) by mouth 2 (two) times daily. 10/21/18   Mcarthur Rossetti, MD  EPINEPHrine (EPIPEN 2-PAK) 0.3 mg/0.3 mL IJ SOAJ injection Inject 0.3 mg into the muscle as needed for anaphylaxis.    [provider]  escitalopram (LEXAPRO) 20 MG tablet Take 1 tablet (20 mg total) by mouth daily. 10/26/18   [name], MD  famotidine (PEPCID) 40 MG tablet Take 1 tablet (40 mg total) by mouth 2 (two) times daily. 10/21/18   Pyrtle, Lajuan Lines, MD  folic acid (FOLVITE) 1 MG tablet Take 1 mg by mouth daily. 09/17/17   [provider]  furosemide (LASIX) 20 MG tablet Take 1 tablet (20 mg total) by mouth 2 (two) times daily. 02/10/18   [name], MD  GENVOYA 150-150-200-10 MG TABS tablet TAKE 1 TABLET BY MOUTH  DAILY WITH BREAKFAST. 10/21/18   Campbell Riches, MD  leflunomide (ARAVA) 20 MG tablet Take 20 mg by mouth daily.  07/10/18   [provider]  nitroGLYCERIN (NITROSTAT) 0.4 MG SL tablet Place 1 tablet (0.4 mg total) under the tongue every 5 (five) minutes as needed for chest pain.  Patient taking differently: Place 0.4 mg under the tongue every 5 (five) minutes x 3 doses as needed for chest pain.  12/24/17 07/26/19  [name], MD  ondansetron (ZOFRAN) 4 MG tablet Take 4 mg by mouth every 6 (six) hours as needed for nausea.    [provider]  polyethylene glycol (MIRALAX / GLYCOLAX) 17 g packet Take 17 g by mouth daily. MIX INTO 4-8 OUNCES OF LIQUID OF CHOICE    [provider]  predniSONE (DELTASONE) 5 MG tablet Take 5 mg by mouth 2 (two) times daily.  11/02/16   [provider]  pregabalin (LYRICA) 200 MG capsule TAKE 1 CAPSULE BY MOUTH TWICE A DAY Patient taking differently: Take 200 mg by mouth 2 (two) times daily.  07/27/18   [name], MD  PREZISTA 800 MG tablet TAKE 1 TABLET BY MOUTH DAILY. 10/21/18   Campbell Riches, MD  SAVAYSA 30 MG TABS tablet TAKE 1 TABLET (30 MG TOTAL) BY MOUTH DAILY. 08/06/18   [name], MD  senna (SENOKOT) 8.6 MG TABS tablet Take 1 tablet by mouth daily.    [provider]  sulfamethoxazole-trimethoprim (BACTRIM DS,SEPTRA DS) 800-160 MG tablet TAKE 1 TABLET BY MOUTH EVERY 12 (TWELVE) HOURS. 08/06/18   [name], MD  valACYclovir (VALTREX) 500 MG tablet Take 1 tablet (500 mg total) by mouth daily. 01/27/18   Campbell Riches, MD     Positive ROS: All other systems have been reviewed and were otherwise negative with the exception of those mentioned in the HPI and as above.  Physical Exam: General: Alert, no acute distress Cardiovascular: No pedal edema Respiratory: No cyanosis, no use of accessory musculature GI: abdomen soft Skin: No lesions in the area of chief complaint Neurologic: Sensation intact distally Psychiatric: Patient is competent for consent with normal mood and affect Lymphatic: no lymphedema  MUSCULOSKELETAL:  NVI Obvious shortening of LLE  Assessment: LEFT TOTAL HIP DISLOCATION  Plan: Plan for Procedure(s): CLOSED REDUCTION LEFT TOTAL HIP  The risks  benefits and alternatives were discussed with the patient including but not limited to the risks of nonoperative treatment, versus surgical intervention including infection, bleeding, nerve injury,  blood clots, cardiopulmonary complications, morbidity, mortality, among others, and they were willing to proceed.   Eduard Roux, MD   10/31/2018 6:26 PM

## 2018-10-31 NOTE — Anesthesia Preprocedure Evaluation (Addendum)
Anesthesia Evaluation  Patient identified by MRN, date of birth, ID band Patient awake    Reviewed: Allergy & Precautions, NPO status , Patient's Chart, lab work & pertinent test results  Airway Mallampati: II  TM Distance: >3 FB     Dental  (+) Teeth Intact, Dental Advisory Given   Pulmonary pneumonia,    breath sounds clear to auscultation       Cardiovascular hypertension, + CAD and + Past MI   Rhythm:Regular Rate:Normal     Neuro/Psych    GI/Hepatic hiatal hernia, PUD, GERD  ,(+) Hepatitis -  Endo/Other    Renal/GU      Musculoskeletal   Abdominal   Peds  Hematology   Anesthesia Other Findings   Reproductive/Obstetrics                          Anesthesia Physical Anesthesia Plan  ASA: IV  Anesthesia Plan: General   Post-op Pain Management:    Induction: Intravenous  PONV Risk Score and Plan: 2 and Ondansetron, Dexamethasone and Midazolam  Airway Management Planned: Oral ETT  Additional Equipment:   Intra-op Plan:   Post-operative Plan: Possible Post-op intubation/ventilation  Informed Consent: I have reviewed the patients History and Physical, chart, labs and discussed the procedure including the risks, benefits and alternatives for the proposed anesthesia with the patient or authorized representative who has indicated his/her understanding and acceptance.     Dental advisory given  Plan Discussed with: CRNA, Anesthesiologist and Surgeon  Anesthesia Plan Comments:        Anesthesia Quick Evaluation

## 2018-10-31 NOTE — Anesthesia Postprocedure Evaluation (Signed)
Anesthesia Post Note  Patient: [name]  Procedure(s) Performed: CLOSED REDUCTION LEFT TOTAL HIP (Left )     Patient location during evaluation: PACU Anesthesia Type: General Level of consciousness: awake Pain management: pain level controlled Vital Signs Assessment: post-procedure vital signs reviewed and stable Respiratory status: spontaneous breathing Cardiovascular status: stable Postop Assessment: no apparent nausea or vomiting Anesthetic complications: no    Last Vitals:  Vitals:   10/31/18 2230 10/31/18 2245  BP: (!) 148/61 126/75  Pulse: 62 78  Resp: (!) 25 20  Temp: (!) 36.2 C 36.6 C  SpO2: 96% 99%    Last Pain:  Vitals:   10/31/18 2245  TempSrc:   PainSc: 0-No pain                 Dominga Mcduffie

## 2018-10-31 NOTE — Transfer of Care (Signed)
Immediate Anesthesia Transfer of Care Note  Patient: [name]  Procedure(s) Performed: CLOSED REDUCTION LEFT TOTAL HIP (Left )  Patient Location: PACU  Anesthesia Type:General  Level of Consciousness: awake, alert  and oriented  Airway & Oxygen Therapy: Patient Spontanous Breathing and Patient connected to nasal cannula oxygen  Post-op Assessment: Report given to RN, Post -op Vital signs reviewed and stable and Patient moving all extremities  Post vital signs: Reviewed and stable  Last Vitals:  Vitals Value Taken Time  BP 140/81 10/31/18 2143  Temp    Pulse 86 10/31/18 2149  Resp 13 10/31/18 2149  SpO2 99 % 10/31/18 2149  Vitals shown include unvalidated device data.  Last Pain:  Vitals:   10/31/18 2000  TempSrc:   PainSc: 7          Complications: No apparent anesthesia complications

## 2018-11-01 ENCOUNTER — Other Ambulatory Visit: Payer: Self-pay

## 2018-11-01 ENCOUNTER — Encounter (HOSPITAL_COMMUNITY): Payer: Self-pay | Admitting: Orthopaedic Surgery

## 2018-11-01 ENCOUNTER — Ambulatory Visit: Payer: Medicare Other | Admitting: Orthopaedic Surgery

## 2018-11-01 DIAGNOSIS — T84021S Dislocation of internal left hip prosthesis, sequela: Secondary | ICD-10-CM | POA: Diagnosis not present

## 2018-11-01 DIAGNOSIS — Z9049 Acquired absence of other specified parts of digestive tract: Secondary | ICD-10-CM | POA: Diagnosis not present

## 2018-11-01 DIAGNOSIS — Z951 Presence of aortocoronary bypass graft: Secondary | ICD-10-CM | POA: Diagnosis not present

## 2018-11-01 DIAGNOSIS — J302 Other seasonal allergic rhinitis: Secondary | ICD-10-CM | POA: Diagnosis present

## 2018-11-01 DIAGNOSIS — I10 Essential (primary) hypertension: Secondary | ICD-10-CM | POA: Diagnosis not present

## 2018-11-01 DIAGNOSIS — Y792 Prosthetic and other implants, materials and accessory orthopedic devices associated with adverse incidents: Secondary | ICD-10-CM | POA: Diagnosis present

## 2018-11-01 DIAGNOSIS — Z888 Allergy status to other drugs, medicaments and biological substances status: Secondary | ICD-10-CM | POA: Diagnosis not present

## 2018-11-01 DIAGNOSIS — T84020D Dislocation of internal right hip prosthesis, subsequent encounter: Secondary | ICD-10-CM | POA: Diagnosis not present

## 2018-11-01 DIAGNOSIS — Z8249 Family history of ischemic heart disease and other diseases of the circulatory system: Secondary | ICD-10-CM | POA: Diagnosis not present

## 2018-11-01 DIAGNOSIS — F419 Anxiety disorder, unspecified: Secondary | ICD-10-CM | POA: Diagnosis not present

## 2018-11-01 DIAGNOSIS — Z89611 Acquired absence of right leg above knee: Secondary | ICD-10-CM | POA: Diagnosis not present

## 2018-11-01 DIAGNOSIS — Z833 Family history of diabetes mellitus: Secondary | ICD-10-CM | POA: Diagnosis not present

## 2018-11-01 DIAGNOSIS — T84020S Dislocation of internal right hip prosthesis, sequela: Secondary | ICD-10-CM | POA: Diagnosis not present

## 2018-11-01 DIAGNOSIS — C9110 Chronic lymphocytic leukemia of B-cell type not having achieved remission: Secondary | ICD-10-CM | POA: Diagnosis not present

## 2018-11-01 DIAGNOSIS — Z471 Aftercare following joint replacement surgery: Secondary | ICD-10-CM | POA: Diagnosis not present

## 2018-11-01 DIAGNOSIS — Z880 Allergy status to penicillin: Secondary | ICD-10-CM | POA: Diagnosis not present

## 2018-11-01 DIAGNOSIS — Z96642 Presence of left artificial hip joint: Secondary | ICD-10-CM | POA: Diagnosis not present

## 2018-11-01 DIAGNOSIS — M797 Fibromyalgia: Secondary | ICD-10-CM | POA: Diagnosis not present

## 2018-11-01 DIAGNOSIS — Z89612 Acquired absence of left leg above knee: Secondary | ICD-10-CM | POA: Diagnosis not present

## 2018-11-01 DIAGNOSIS — I2510 Atherosclerotic heart disease of native coronary artery without angina pectoris: Secondary | ICD-10-CM | POA: Diagnosis not present

## 2018-11-01 DIAGNOSIS — B20 Human immunodeficiency virus [HIV] disease: Secondary | ICD-10-CM | POA: Diagnosis not present

## 2018-11-01 DIAGNOSIS — T84021A Dislocation of internal left hip prosthesis, initial encounter: Secondary | ICD-10-CM | POA: Diagnosis not present

## 2018-11-01 DIAGNOSIS — K219 Gastro-esophageal reflux disease without esophagitis: Secondary | ICD-10-CM | POA: Diagnosis not present

## 2018-11-01 DIAGNOSIS — M810 Age-related osteoporosis without current pathological fracture: Secondary | ICD-10-CM | POA: Diagnosis present

## 2018-11-01 DIAGNOSIS — Z20828 Contact with and (suspected) exposure to other viral communicable diseases: Secondary | ICD-10-CM | POA: Diagnosis not present

## 2018-11-01 DIAGNOSIS — I252 Old myocardial infarction: Secondary | ICD-10-CM | POA: Diagnosis not present

## 2018-11-01 DIAGNOSIS — M109 Gout, unspecified: Secondary | ICD-10-CM | POA: Diagnosis not present

## 2018-11-01 DIAGNOSIS — D62 Acute posthemorrhagic anemia: Secondary | ICD-10-CM | POA: Diagnosis not present

## 2018-11-01 DIAGNOSIS — Z96653 Presence of artificial knee joint, bilateral: Secondary | ICD-10-CM | POA: Diagnosis present

## 2018-11-01 DIAGNOSIS — M069 Rheumatoid arthritis, unspecified: Secondary | ICD-10-CM | POA: Diagnosis present

## 2018-11-01 MED ORDER — DEXAMETHASONE SODIUM PHOSPHATE 10 MG/ML IJ SOLN: 8.0000 mg | Freq: Once | INTRAMUSCULAR | Status: DC

## 2018-11-01 MED ORDER — SODIUM CHLORIDE 0.9 % IV SOLN
INTRAVENOUS | Status: DC
  Administered 2018-11-02: 10:00:00 via INTRAVENOUS

## 2018-11-01 MED ORDER — DEXAMETHASONE SODIUM PHOSPHATE 4 MG/ML IJ SOLN: 8.0000 mg | INTRAMUSCULAR | Status: AC

## 2018-11-01 NOTE — Progress Notes (Signed)
Pt called me into the room to see a tinge of blood in his condom cath.  I told him that I would make Dr Ninfa Linden aware of it

## 2018-11-01 NOTE — Care Plan (Signed)
Ortho Bundle Case Management Note  Patient Details  Name: [name] MRN: [id number] Date of Birth: 03-12-1951     [facility] received call from patient's daughter and was made aware that patient sustained another Left hip dislocation on 10/31/18. Hip reduced surgically by Dr. Erlinda Hong yesterday and anticipate surgery tomorrow, 11/02/18 per Dr. Ninfa Linden to revise left hip arthroplasty again. Patient has all DME needed. Unsure currently of discharge plans as his daughter, who assists at home, will be having upcoming surgery herself. Will continue to follow along while in the hospital. RNCM was also contacted by hospital SW/Discharge planning.         DME Arranged:   DME Agency:    HH Arranged:   HH Agency:    Additional Comments: Please contact me with any questions of if this plan should need to change. [phone]     11/01/2018, 3:35 PM

## 2018-11-01 NOTE — Plan of Care (Signed)
  Problem: Education: Goal: Knowledge of General Education information will improve Description: Including pain rating scale, medication(s)/side effects and non-pharmacologic comfort measures Outcome: Progressing   Problem: Clinical Measurements: Goal: Will remain free from infection Outcome: Progressing   Problem: Nutrition: Goal: Adequate nutrition will be maintained Outcome: Progressing   Problem: Coping: Goal: Level of anxiety will decrease Outcome: Progressing   Problem: Elimination: Goal: Will not experience complications related to bowel motility Outcome: Progressing Goal: Will not experience complications related to urinary retention Outcome: Progressing   Problem: Pain Managment: Goal: General experience of comfort will improve Outcome: Progressing   Problem: Safety: Goal: Ability to remain free from injury will improve Outcome: Progressing   Problem: Skin Integrity: Goal: Risk for impaired skin integrity will decrease Outcome: Progressing

## 2018-11-01 NOTE — Progress Notes (Signed)
Talked with pt regarding surgery in the am.  Had pt to sign consent form and asked if he had any questions.  He stated that Dr Ninfa Linden had talked with him in length of the benefits/complications regarding the procedure.  Pt is very much in favor of the elective procedure in the am.  All questions about pre-op, NPO, and pain control after surgery discussed .

## 2018-11-01 NOTE — Progress Notes (Signed)
Paged Dr Ninfa Linden to make aware of the tinge of blood per pt concerns.

## 2018-11-01 NOTE — Progress Notes (Signed)
Pt alert and oriented x4, no complaints of pain or discomfort.  Bed in low position, call bell within reach.  Bed alarms on and functioning.  Assessment done and charted.  Will continue to monitor and do hourly rounding throughout the shift 

## 2018-11-01 NOTE — Evaluation (Signed)
Physical Therapy Evaluation Patient Details Name: [name] MRN: [id number] DOB: 10-Dec-1950 Today's Date: 11/01/2018   History of Present Illness  68 yo male with onset of dislocation of L hip while sitting still in a restaurant was admitted for closed reduction on 6/21, now referred to PT.  Had a THA on 4/3 for avascular necrosis, dislocated 4/5 with closed reduction, revised surgery 4/7, sent home with another dislocation and another closed reduction 4/18.  Dislocated 5/6, L THA revised 5/8.  68 yo male with onset of dislocation of L hip while sitting still in a restaurant was admitted for closed reduction on 6/21, now referred to PT.  Had a THA on 4/3 for avascular necrosis, dislocated 4/5 with closed reduction, revised surgery 4/7, sent home with another dislocation and another closed reduction 4/18.  Dislocated 5/6, L THA revised 5/8.    Clinical Impression  Pt was seen for evaluation and since PT left MD has determined pt will receive a surgery tomorrow to increase intrajoint stability on L THA.  Pt has received mult procedures and revisions, and will be getting a stabilizing device for inside the THA.  Will continue PT after this surgery as MD orders, since his history has been lengthy and will not assume PT permissions for mobility until afterward with new order.    Follow Up Recommendations Home health PT;Supervision for mobility/OOB    Equipment Recommendations  None recommended by PT    Recommendations for Other Services       Precautions / Restrictions Precautions Precautions: Fall Precaution Comments: going to surgery tomorrow for liner to L hip THA Required Braces or Orthoses: Other Brace(has abd splint on in bed) Restrictions Weight Bearing Restrictions: No      Mobility  Bed Mobility Overal bed mobility: Needs Assistance Bed Mobility: Supine to Sit     Supine to sit: Min guard     General bed mobility comments: pt asked PT to let him move to side of bed and  was able to observe him maintain neutral alignment on L hip to pivot and scoot to side of bed  Transfers Overall transfer level: Needs assistance Equipment used: Rolling walker (2 wheeled);1 person hand held assist Transfers: Sit to/from Stand Sit to Stand: Min assist;From elevated surface         General transfer comment: pt is using hands with cued posture to stand, min assist to lower to chair with elevated surface on chair  Ambulation/Gait Ambulation/Gait assistance: Min assist Gait Distance (Feet): 4 Feet Assistive device: Rolling walker (2 wheeled);1 person hand held assist Gait Pattern/deviations: Step-to pattern;Decreased stride length;Decreased weight shift to left;Trunk flexed;Wide base of support Gait velocity: reduced Gait velocity interpretation: <1.31 ft/sec, indicative of household ambulator General Gait Details: pt is up to walk with RW and controls the abd and add on hip to take controlled sidesteps with no issue maintaining alignment of neutral on L hip  Stairs            Wheelchair Mobility    Modified Rankin (Stroke Patients Only)       Balance Overall balance assessment: Needs assistance Sitting-balance support: Feet supported Sitting balance-[name]: Fair     Standing balance support: Bilateral upper extremity supported;During functional activity Standing balance-[name]: Fair Standing balance comment: less than fair dynamically                             Pertinent Vitals/Pain Pain Assessment: Faces Faces  Pain Scale: Hurts a little bit Pain Location: L hip Pain Descriptors / Indicators: Sore Pain Intervention(s): Monitored during session;Repositioned;Premedicated before session    Home Living Family/patient expects to be discharged to:: Private residence Living Arrangements: Alone Available Help at Discharge: Family;Available 24 hours/day Type of Home: House Home Access: Stairs to enter Entrance Stairs-Rails:  Right Entrance Stairs-Number of Steps: 4 Home Layout: One level Home Equipment: Grab bars - toilet;Walker - 2 wheels;Shower seat Additional Comments: has family to help at home    Prior Function Level of Independence: Independent with assistive device(s)   Gait / Transfers Assistance Needed: was out at [place] with no brace, using RW  ADL's / Homemaking Assistance Needed: has been able to do dressing and bathing at home        Hand Dominance   Dominant Hand: Right    Extremity/Trunk Assessment   Upper Extremity Assessment Upper Extremity Assessment: Overall WFL for tasks assessed    Lower Extremity Assessment Lower Extremity Assessment: LLE deficits/detail LLE Deficits / Details: L hip is weak but pt can lift it to move off the bed LLE: Unable to fully assess due to immobilization LLE Coordination: decreased gross motor    Cervical / Trunk Assessment Cervical / Trunk Assessment: Other exceptions(has lumbar spinal fusion that stresses his L hip joint)  Communication   Communication: HOH;Other (comment)(has B hearing aids)  Cognition Arousal/Alertness: Awake/alert Behavior During Therapy: WFL for tasks assessed/performed Overall Cognitive Status: Within Functional Limits for tasks assessed                                 General Comments: pt is hyperaware of L hip posture and understands how to keep it neutral      General Comments General comments (skin integrity, edema, etc.): pt is in bed with abd cushion, removed and discussed full neutral hip posture to avoid both ant and post precautions which pt could maintain with minor cues    Exercises     Assessment/Plan    PT Assessment Patient needs continued PT services  PT Problem List Decreased strength;Decreased range of motion;Decreased activity tolerance;Decreased balance;Decreased mobility;Decreased coordination;Decreased knowledge of use of DME;Decreased safety awareness;Decreased knowledge of  precautions;Pain       PT Treatment Interventions DME instruction;Gait training;Stair training;Functional mobility training;Therapeutic activities;Therapeutic exercise;Balance training;Neuromuscular re-education;Patient/family education    PT Goals (Current goals can be found in the Care Plan section)  Acute Rehab PT Goals Patient Stated Goal: to walk and get L hip to stop dislocating PT Goal Formulation: With patient Time For Goal Achievement: 11/15/18 Potential to Achieve Goals: Good    Frequency Min 3X/week   Barriers to discharge Decreased caregiver support;Inaccessible home environment has usually been alone with stairs to enter house    Co-evaluation               AM-PAC PT "6 Clicks" Mobility  Outcome Measure Help needed turning from your back to your side while in a flat bed without using bedrails?: A Little Help needed moving from lying on your back to sitting on the side of a flat bed without using bedrails?: A Little Help needed moving to and from a bed to a chair (including a wheelchair)?: A Little Help needed standing up from a chair using your arms (e.g., wheelchair or bedside chair)?: A Little Help needed to walk in hospital room?: A Little Help needed climbing 3-5 steps with a railing? : Total 6  Click Score: 16    End of Session Equipment Utilized During Treatment: Gait belt Activity Tolerance: Patient tolerated treatment well;Treatment limited secondary to medical complications (Comment)(precautions for L hip) Patient left: in chair;with call bell/phone within reach;with chair alarm set Nurse Communication: Mobility status(pt request for MD communication, also messaged MD) PT Visit Diagnosis: Unsteadiness on feet (R26.81);Muscle weakness (generalized) (M62.81);Difficulty in walking, not elsewhere classified (R26.2);Pain;Other (comment)(stability precautions for L hip) Pain - Right/Left: Left Pain - part of body: Hip    Time: 9355-2174 PT Time  Calculation (min) (ACUTE ONLY): 40 min   Charges:   PT Evaluation $PT Eval Moderate Complexity: 1 Mod PT Treatments $Gait Training: 8-22 mins $Therapeutic Activity: 8-22 mins       Ramond Dial 11/01/2018, 1:30 PM  Mee Hives, PT MS Acute Rehab Dept. Number: [place] [facility] [place]

## 2018-11-01 NOTE — Progress Notes (Signed)
Patient ID: [name], male   DOB: 11/11/1950, 68 y.o.   MRN: [id number] The patient is well-known to me.  He is now 6 weeks status post a second revision of his left hip due to hip instability and dislocations.  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  We have tried to be as meticulous as possible getting his acetabular component in the ideal position which happens to be a low window of positioning in terms of having instability either posteriorly or anteriorly.  At his last surgery we put more anteversion into the acetabular component as well as a posterior lipped liner.  We have him longer on his left operative side than his right side.  We also had him with more offset.  He has been in a knee immobilizer and is done well for 6 weeks.  He took the knee immobilizer off and was in a restaurant sitting with his knee in a flexed position and he dislocated once again.  I assume this is still likely a posterior dislocation.  I talked to him in length about the next steps.  We plan on taking him to surgery tomorrow.  Most likely we will place a constrained liner.  I will look closely at the positioning of his hip again in terms of the alignment to make a decision whether or not we would need to do something on the acetabular side other than a constrained liner.  That decision will be made at the time of surgery.  I talked to him in length in detail about this and had a discussion about the risk and benefits of surgery.  We will plan on doing this tomorrow.

## 2018-11-02 ENCOUNTER — Encounter (HOSPITAL_COMMUNITY): Payer: Self-pay | Admitting: Certified Registered Nurse Anesthetist

## 2018-11-02 ENCOUNTER — Inpatient Hospital Stay (HOSPITAL_COMMUNITY): Payer: Medicare Other | Admitting: Anesthesiology

## 2018-11-02 ENCOUNTER — Encounter (HOSPITAL_COMMUNITY)
Admission: AD | Disposition: A | Discharge status: Home / Self Care | Payer: Self-pay | Source: Other Acute Inpatient Hospital | Attending: Orthopaedic Surgery

## 2018-11-02 ENCOUNTER — Inpatient Hospital Stay (HOSPITAL_COMMUNITY): Payer: Medicare Other

## 2018-11-02 DIAGNOSIS — Z96642 Presence of left artificial hip joint: Secondary | ICD-10-CM

## 2018-11-02 DIAGNOSIS — T84020A Dislocation of internal right hip prosthesis, initial encounter: Secondary | ICD-10-CM

## 2018-11-02 DIAGNOSIS — T84020D Dislocation of internal right hip prosthesis, subsequent encounter: Secondary | ICD-10-CM

## 2018-11-02 DIAGNOSIS — T84020S Dislocation of internal right hip prosthesis, sequela: Secondary | ICD-10-CM

## 2018-11-02 HISTORY — PX: ANTERIOR HIP REVISION: SHX6527

## 2018-11-02 LAB — SURGICAL PCR SCREEN
MRSA, PCR: NEGATIVE
Staphylococcus aureus: NEGATIVE

## 2018-11-02 SURGERY — REVISION, TOTAL ARTHROPLASTY, HIP, ANTERIOR APPROACH
Anesthesia: General | Site: Hip | Laterality: Left

## 2018-11-02 MED ORDER — ROCURONIUM BROMIDE 10 MG/ML (PF) SYRINGE
PREFILLED_SYRINGE | INTRAVENOUS | Status: AC
  Filled 2018-11-02: qty 10

## 2018-11-02 MED ORDER — MEPERIDINE HCL 25 MG/ML IJ SOLN: 6.2500 mg | INTRAMUSCULAR | Status: DC | PRN

## 2018-11-02 MED ORDER — ACETAMINOPHEN 160 MG/5ML PO SOLN: 325.0000 mg | ORAL | Status: DC | PRN

## 2018-11-02 MED ORDER — PHENYLEPHRINE 40 MCG/ML (10ML) SYRINGE FOR IV PUSH (FOR BLOOD PRESSURE SUPPORT)
PREFILLED_SYRINGE | INTRAVENOUS | Status: AC
  Filled 2018-11-02: qty 10

## 2018-11-02 MED ORDER — ROCURONIUM BROMIDE 10 MG/ML (PF) SYRINGE
PREFILLED_SYRINGE | INTRAVENOUS | Status: DC | PRN
  Administered 2018-11-02: 30 mg via INTRAVENOUS

## 2018-11-02 MED ORDER — FENTANYL CITRATE (PF) 250 MCG/5ML IJ SOLN
INTRAMUSCULAR | Status: AC
  Filled 2018-11-02: qty 5

## 2018-11-02 MED ORDER — SUCCINYLCHOLINE CHLORIDE 200 MG/10ML IV SOSY
PREFILLED_SYRINGE | INTRAVENOUS | Status: AC
  Filled 2018-11-02: qty 10

## 2018-11-02 MED ORDER — ACETAMINOPHEN 325 MG PO TABS: 325.0000 mg | ORAL_TABLET | Freq: Four times a day (QID) | ORAL | Status: DC | PRN

## 2018-11-02 MED ORDER — ESMOLOL HCL 100 MG/10ML IV SOLN
INTRAVENOUS | Status: AC
  Filled 2018-11-02: qty 10

## 2018-11-02 MED ORDER — LIDOCAINE 2% (20 MG/ML) 5 ML SYRINGE
INTRAMUSCULAR | Status: DC | PRN
  Administered 2018-11-02: 100 mg via INTRAVENOUS

## 2018-11-02 MED ORDER — POLYETHYLENE GLYCOL 3350 17 G PO PACK: 17.0000 g | PACK | Freq: Every day | ORAL | Status: DC | PRN

## 2018-11-02 MED ORDER — GLYCOPYRROLATE PF 0.2 MG/ML IJ SOSY
PREFILLED_SYRINGE | INTRAMUSCULAR | Status: DC | PRN
  Administered 2018-11-02: .2 mg via INTRAVENOUS

## 2018-11-02 MED ORDER — ONDANSETRON HCL 4 MG/2ML IJ SOLN: 4.0000 mg | Freq: Four times a day (QID) | INTRAMUSCULAR | Status: DC | PRN

## 2018-11-02 MED ORDER — HYDROCODONE-ACETAMINOPHEN 5-325 MG PO TABS: 1.0000 | ORAL_TABLET | ORAL | Status: DC | PRN

## 2018-11-02 MED ORDER — MORPHINE SULFATE (PF) 2 MG/ML IV SOLN: 0.5000 mg | INTRAVENOUS | Status: DC | PRN

## 2018-11-02 MED ORDER — ONDANSETRON HCL 4 MG/2ML IJ SOLN
INTRAMUSCULAR | Status: DC | PRN
  Administered 2018-11-02: 4 mg via INTRAVENOUS

## 2018-11-02 MED ORDER — PHENYLEPHRINE 40 MCG/ML (10ML) SYRINGE FOR IV PUSH (FOR BLOOD PRESSURE SUPPORT)
PREFILLED_SYRINGE | INTRAVENOUS | Status: DC | PRN
  Administered 2018-11-02: 120 ug via INTRAVENOUS

## 2018-11-02 MED ORDER — SODIUM CHLORIDE 0.9 % IV SOLN: INTRAVENOUS | Status: DC

## 2018-11-02 MED ORDER — ALUM & MAG HYDROXIDE-SIMETH 200-200-20 MG/5ML PO SUSP: 30.0000 mL | ORAL | Status: DC | PRN

## 2018-11-02 MED ORDER — ACETAMINOPHEN 325 MG PO TABS: 325.0000 mg | ORAL_TABLET | ORAL | Status: DC | PRN

## 2018-11-02 MED ORDER — OXYCODONE HCL 5 MG PO TABS
ORAL_TABLET | ORAL | Status: AC
  Filled 2018-11-02: qty 1

## 2018-11-02 MED ORDER — OXYCODONE HCL 5 MG/5ML PO SOLN: 5.0000 mg | Freq: Once | ORAL | Status: AC | PRN

## 2018-11-02 MED ORDER — VANCOMYCIN HCL 1000 MG IV SOLR
INTRAVENOUS | Status: DC | PRN
  Administered 2018-11-02: 1000 mg via TOPICAL

## 2018-11-02 MED ORDER — MENTHOL 3 MG MT LOZG: 1.0000 | LOZENGE | OROMUCOSAL | Status: DC | PRN

## 2018-11-02 MED ORDER — PROPOFOL 10 MG/ML IV BOLUS
INTRAVENOUS | Status: DC | PRN
  Administered 2018-11-02: 140 mg via INTRAVENOUS

## 2018-11-02 MED ORDER — GLYCOPYRROLATE PF 0.2 MG/ML IJ SOSY
PREFILLED_SYRINGE | INTRAMUSCULAR | Status: AC
  Filled 2018-11-02: qty 1

## 2018-11-02 MED ORDER — METOCLOPRAMIDE HCL 5 MG PO TABS: 5.0000 mg | ORAL_TABLET | Freq: Three times a day (TID) | ORAL | Status: DC | PRN

## 2018-11-02 MED ORDER — VANCOMYCIN HCL 1000 MG IV SOLR
INTRAVENOUS | Status: AC
  Filled 2018-11-02: qty 1000

## 2018-11-02 MED ORDER — VANCOMYCIN HCL IN DEXTROSE 1-5 GM/200ML-% IV SOLN
INTRAVENOUS | Status: AC
  Administered 2018-11-02: 1000 mg
  Filled 2018-11-02: qty 200

## 2018-11-02 MED ORDER — SODIUM CHLORIDE 0.9 % IR SOLN
Status: DC | PRN
  Administered 2018-11-02 (×2): 3000 mL

## 2018-11-02 MED ORDER — FENTANYL CITRATE (PF) 100 MCG/2ML IJ SOLN: 25.0000 ug | INTRAMUSCULAR | Status: DC | PRN

## 2018-11-02 MED ORDER — ESMOLOL HCL 100 MG/10ML IV SOLN
INTRAVENOUS | Status: DC | PRN
  Administered 2018-11-02 (×2): 10 mg via INTRAVENOUS

## 2018-11-02 MED ORDER — ONDANSETRON HCL 4 MG/2ML IJ SOLN
INTRAMUSCULAR | Status: AC
  Filled 2018-11-02: qty 2

## 2018-11-02 MED ORDER — OXYCODONE HCL 5 MG PO TABS
5.0000 mg | ORAL_TABLET | Freq: Once | ORAL | Status: AC | PRN
  Administered 2018-11-02: 5 mg via ORAL

## 2018-11-02 MED ORDER — SUGAMMADEX SODIUM 200 MG/2ML IV SOLN
INTRAVENOUS | Status: DC | PRN
  Administered 2018-11-02: 156 mg via INTRAVENOUS

## 2018-11-02 MED ORDER — DEXAMETHASONE SODIUM PHOSPHATE 10 MG/ML IJ SOLN
INTRAMUSCULAR | Status: AC
  Filled 2018-11-02: qty 1

## 2018-11-02 MED ORDER — 0.9 % SODIUM CHLORIDE (POUR BTL) OPTIME
TOPICAL | Status: DC | PRN
  Administered 2018-11-02: 1000 mL

## 2018-11-02 MED ORDER — SUCCINYLCHOLINE CHLORIDE 200 MG/10ML IV SOSY
PREFILLED_SYRINGE | INTRAVENOUS | Status: DC | PRN
  Administered 2018-11-02: 140 mg via INTRAVENOUS

## 2018-11-02 MED ORDER — ONDANSETRON HCL 4 MG/2ML IJ SOLN: 4.0000 mg | Freq: Once | INTRAMUSCULAR | Status: DC | PRN

## 2018-11-02 MED ORDER — EPHEDRINE 5 MG/ML INJ
INTRAVENOUS | Status: AC
  Filled 2018-11-02: qty 10

## 2018-11-02 MED ORDER — PANTOPRAZOLE SODIUM 40 MG PO TBEC
40.0000 mg | DELAYED_RELEASE_TABLET | Freq: Every day | ORAL | Status: DC
  Administered 2018-11-03 – 2018-11-05 (×3): 40 mg via ORAL
  Filled 2018-11-02 (×3): qty 1

## 2018-11-02 MED ORDER — EPHEDRINE SULFATE-NACL 50-0.9 MG/10ML-% IV SOSY
PREFILLED_SYRINGE | INTRAVENOUS | Status: DC | PRN
  Administered 2018-11-02: 10 mg via INTRAVENOUS

## 2018-11-02 MED ORDER — DEXAMETHASONE SODIUM PHOSPHATE 10 MG/ML IJ SOLN
INTRAMUSCULAR | Status: DC | PRN
  Administered 2018-11-02: 10 mg via INTRAVENOUS

## 2018-11-02 MED ORDER — LIDOCAINE 2% (20 MG/ML) 5 ML SYRINGE
INTRAMUSCULAR | Status: AC
  Filled 2018-11-02: qty 5

## 2018-11-02 MED ORDER — FENTANYL CITRATE (PF) 250 MCG/5ML IJ SOLN
INTRAMUSCULAR | Status: DC | PRN
  Administered 2018-11-02 (×2): 50 ug via INTRAVENOUS
  Administered 2018-11-02: 100 ug via INTRAVENOUS
  Administered 2018-11-02: 50 ug via INTRAVENOUS

## 2018-11-02 MED ORDER — VANCOMYCIN HCL IN DEXTROSE 1-5 GM/200ML-% IV SOLN
1000.0000 mg | Freq: Two times a day (BID) | INTRAVENOUS | Status: AC
  Filled 2018-11-02: qty 200

## 2018-11-02 MED ORDER — SODIUM CHLORIDE 0.9 % IV SOLN
INTRAVENOUS | Status: DC | PRN
  Administered 2018-11-02: 25 ug/min via INTRAVENOUS

## 2018-11-02 MED ORDER — METOCLOPRAMIDE HCL 5 MG/ML IJ SOLN: 5.0000 mg | Freq: Three times a day (TID) | INTRAMUSCULAR | Status: DC | PRN

## 2018-11-02 MED ORDER — DIPHENHYDRAMINE HCL 12.5 MG/5ML PO ELIX: 12.5000 mg | ORAL_SOLUTION | ORAL | Status: DC | PRN

## 2018-11-02 MED ORDER — PHENOL 1.4 % MT LIQD: 1.0000 | OROMUCOSAL | Status: DC | PRN

## 2018-11-02 MED ORDER — ONDANSETRON HCL 4 MG PO TABS: 4.0000 mg | ORAL_TABLET | Freq: Four times a day (QID) | ORAL | Status: DC | PRN

## 2018-11-02 MED ORDER — DEXMEDETOMIDINE HCL IN NACL 200 MCG/50ML IV SOLN
INTRAVENOUS | Status: DC | PRN
  Administered 2018-11-02: 8 ug via INTRAVENOUS
  Administered 2018-11-02: 12 ug via INTRAVENOUS

## 2018-11-02 MED ORDER — LACTATED RINGERS IV SOLN
INTRAVENOUS | Status: DC
  Administered 2018-11-02: 11:00:00 via INTRAVENOUS

## 2018-11-02 MED ORDER — HYDROCODONE-ACETAMINOPHEN 7.5-325 MG PO TABS: 1.0000 | ORAL_TABLET | ORAL | Status: DC | PRN

## 2018-11-02 MED ORDER — VANCOMYCIN HCL 1000 MG IV SOLR
INTRAVENOUS | Status: DC | PRN
  Administered 2018-11-02: 1000 mg via INTRAVENOUS

## 2018-11-02 MED ORDER — DOCUSATE SODIUM 100 MG PO CAPS
100.0000 mg | ORAL_CAPSULE | Freq: Two times a day (BID) | ORAL | Status: DC
  Filled 2018-11-02 (×5): qty 1

## 2018-11-02 SURGICAL SUPPLY — 55 items
COVER SURGICAL LIGHT HANDLE (MISCELLANEOUS) ×3 IMPLANT
COVER WAND RF STERILE (DRAPES) ×1 IMPLANT
DRAPE HIP W/POCKET STRL (DRAPE) ×1 IMPLANT
DRAPE INCISE IOBAN 66X45 STRL (DRAPES) ×1 IMPLANT
DRAPE U-SHAPE 47X51 STRL (DRAPES) ×4 IMPLANT
DRILL BIT 7/64X5 (BIT) IMPLANT
DRSG AQUACEL AG ADV 3.5X10 (GAUZE/BANDAGES/DRESSINGS) ×1 IMPLANT
DRSG AQUACEL AG ADV 3.5X14 (GAUZE/BANDAGES/DRESSINGS) IMPLANT
DURAPREP 26ML APPLICATOR (WOUND CARE) ×2 IMPLANT
ELECT BLADE 6.5 EXT (BLADE) IMPLANT
ELECT CAUTERY BLADE 6.4 (BLADE) ×2 IMPLANT
ELECT REM PT RETURN 9FT ADLT (ELECTROSURGICAL) ×2
ELECTRODE REM PT RTRN 9FT ADLT (ELECTROSURGICAL) ×1 IMPLANT
EVACUATOR 1/8 PVC DRAIN (DRAIN) IMPLANT
FACESHIELD WRAPAROUND (MASK) ×4 IMPLANT
FACESHIELD WRAPAROUND OR TEAM (MASK) ×2 IMPLANT
GAUZE XEROFORM 1X8 LF (GAUZE/BANDAGES/DRESSINGS) ×1 IMPLANT
GLOVE BIO SURGEON STRL SZ8 (GLOVE) ×2 IMPLANT
GLOVE BIOGEL PI IND STRL 8 (GLOVE) ×1 IMPLANT
GLOVE BIOGEL PI INDICATOR 8 (GLOVE) ×3
GLOVE ORTHO TXT STRL SZ7.5 (GLOVE) ×6 IMPLANT
GOWN STRL REUS W/ TWL LRG LVL3 (GOWN DISPOSABLE) ×2 IMPLANT
GOWN STRL REUS W/ TWL XL LVL3 (GOWN DISPOSABLE) ×4 IMPLANT
GOWN STRL REUS W/TWL LRG LVL3 (GOWN DISPOSABLE) ×2
GOWN STRL REUS W/TWL XL LVL3 (GOWN DISPOSABLE) ×2
HANDPIECE INTERPULSE COAX TIP (DISPOSABLE) ×1
HEAD FEMUR METAL 36MM 15.6 HIP (Head) IMPLANT
IMMOBILIZER KNEE 22 (SOFTGOODS) ×1 IMPLANT
IV NS IRRIG 3000ML ARTHROMATIC (IV SOLUTION) ×2 IMPLANT
KIT BASIN OR (CUSTOM PROCEDURE TRAY) ×2 IMPLANT
KIT TURNOVER KIT B (KITS) ×2 IMPLANT
LINER CONSTRAINED 36X58P4/10 (Liner) ×1 IMPLANT
MANIFOLD NEPTUNE II (INSTRUMENTS) ×2 IMPLANT
METAL FEMUR HEAD 36MM 15.6 HIP (Head) ×2 IMPLANT
NS IRRIG 1000ML POUR BTL (IV SOLUTION) ×2 IMPLANT
PACK TOTAL JOINT (CUSTOM PROCEDURE TRAY) ×2 IMPLANT
PAD ARMBOARD 7.5X6 YLW CONV (MISCELLANEOUS) ×5 IMPLANT
PASSER SUT SWANSON 36MM LOOP (INSTRUMENTS) IMPLANT
SET HNDPC FAN SPRY TIP SCT (DISPOSABLE) ×1 IMPLANT
SPONGE LAP 18X18 RF (DISPOSABLE) IMPLANT
STAPLER VISISTAT 35W (STAPLE) ×1 IMPLANT
SUCTION FRAZIER HANDLE 10FR (MISCELLANEOUS) ×1
SUCTION TUBE FRAZIER 10FR DISP (MISCELLANEOUS) ×1 IMPLANT
SUT ETHIBOND NAB CT1 #1 30IN (SUTURE) ×2 IMPLANT
SUT ETHILON 2 0 FS 18 (SUTURE) ×2 IMPLANT
SUT VIC AB 1 CT1 27 (SUTURE) ×1
SUT VIC AB 1 CT1 27XBRD ANTBC (SUTURE) ×2 IMPLANT
SUT VIC AB 2-0 CT1 27 (SUTURE) ×2
SUT VIC AB 2-0 CT1 TAPERPNT 27 (SUTURE) ×2 IMPLANT
TIP HIGH FLOW IRRIGATION COAX (MISCELLANEOUS) ×1 IMPLANT
TOWEL GREEN STERILE FF (TOWEL DISPOSABLE) ×1 IMPLANT
TOWEL OR 17X26 10 PK STRL BLUE (TOWEL DISPOSABLE) ×2 IMPLANT
TOWER CARTRIDGE SMART MIX (DISPOSABLE) IMPLANT
TRAY FOLEY BAG SILVER LF 16FR (CATHETERS) IMPLANT
WATER STERILE IRR 1000ML POUR (IV SOLUTION) ×3 IMPLANT

## 2018-11-02 NOTE — Progress Notes (Signed)
Pre-op report called and given to Sapna, RN in [employer]. All questions answered to satisfaction. Awaiting OR personnel to transport pt. Will continue to monitor.

## 2018-11-02 NOTE — Progress Notes (Signed)
Patient ID: [name], male   DOB: April 03, 1951, 68 y.o.   MRN: [id number] The patient understands fully that we are proceeding to the OR once again today to address his unstable left hip.  This is been quite an undertaking trying to give him a stable hip that is not dislocated.  This will be the third revision surgery with likely placing a constrained liner.  Intraoperatively we may determine that we need to try to once again further reposition the acetabular component.  He understands the rationale and reasoning behind proceeding to surgery.  We had a long and thorough discussion about the risk and benefits involved including the risk of acute blood loss anemia, infection and hip instability.  Informed consent is obtained.

## 2018-11-02 NOTE — Progress Notes (Signed)
Pt returned to room 5N28 after surgery. Received report from [place], [place] in PACU. See reassessment. Will continue to monitor.

## 2018-11-02 NOTE — Plan of Care (Signed)
Problem: Education: Goal: Knowledge of General Education information will improve Description: Including pain rating scale, medication(s)/side effects and non-pharmacologic comfort measures Outcome: Progressing   Problem: Clinical Measurements: Goal: Will remain free from infection Outcome: Progressing   Problem: Coping: Goal: Level of anxiety will decrease Outcome: Progressing   Problem: Elimination: Goal: Will not experience complications related to urinary retention Outcome: Progressing   Problem: Pain Managment: Goal: General experience of comfort will improve Outcome: Progressing   Problem: Safety: Goal: Ability to remain free from injury will improve Outcome: Progressing   Problem: Skin Integrity: Goal: Risk for impaired skin integrity will decrease Outcome: Progressing

## 2018-11-02 NOTE — Progress Notes (Signed)
RN updated pt's daughter, [name], that pt returned to 5N28 after surgery. All questions answered to satisfaction. Will continue to monitor.

## 2018-11-02 NOTE — Anesthesia Preprocedure Evaluation (Addendum)
Anesthesia Evaluation  Patient identified by MRN, date of birth, ID band Patient awake    Reviewed: Allergy & Precautions, NPO status , Patient's Chart, lab work & pertinent test results  Airway Mallampati: II  TM Distance: >3 FB     Dental  (+) Teeth Intact, Dental Advisory Given   Pulmonary pneumonia,    breath sounds clear to auscultation       Cardiovascular hypertension, + CAD and + Past MI   Rhythm:Regular Rate:Normal  Echo 16' Study Conclusions   - Left ventricle: The cavity size was normal. Systolic function was  normal. The estimated ejection fraction was in the range of 55%  to 60%. There was no evidence of elevated ventricular filling  pressure by Doppler parameters.  - Aortic valve: There was mild regurgitation.  - Left atrium: The atrium was moderately dilated.  - Right ventricle: The cavity size was mildly dilated.  - Right atrium: The atrium was mildly dilated.  - Tricuspid valve: There was moderate regurgitation.    Neuro/Psych 40-60% right ICA stenosis (09/2008) TIA Neuromuscular disease CVA    GI/Hepatic hiatal hernia, PUD, GERD  ,(+) Hepatitis -, A  Endo/Other    Renal/GU      Musculoskeletal  (+) Arthritis , Osteoarthritis,    Abdominal   Peds  Hematology   Anesthesia Other Findings   Reproductive/Obstetrics                            Anesthesia Physical  Anesthesia Plan  ASA: IV  Anesthesia Plan: General   Post-op Pain Management:    Induction: Intravenous  PONV Risk Score and Plan: 2 and Ondansetron, Dexamethasone and Midazolam  Airway Management Planned: Oral ETT and LMA  Additional Equipment:   Intra-op Plan:   Post-operative Plan: Possible Post-op intubation/ventilation  Informed Consent: I have reviewed the patients History and Physical, chart, labs and discussed the procedure including the risks, benefits and alternatives for the  proposed anesthesia with the patient or authorized representative who has indicated his/her understanding and acceptance.     Dental advisory given  Plan Discussed with: CRNA, Anesthesiologist and Surgeon  Anesthesia Plan Comments:         Anesthesia Quick Evaluation

## 2018-11-02 NOTE — Op Note (Signed)
NAME: [name], [name]. MEDICAL RECORD VO:[id number] ACCOUNT [id number] DATE OF BIRTH:02-Aug-1950 FACILITY: [facility] LOCATION: [facility] PHYSICIAN:Jacquelyne Quarry [place], MD  OPERATIVE REPORT  DATE OF PROCEDURE:  11/02/2018  PREOPERATIVE DIAGNOSIS:  Unstable left total hip arthroplasty.  POSTOPERATIVE DIAGNOSIS:  Unstable left total hip arthroplasty.  PROCEDURE:  Revision of left total hip arthroplasty with placement of a constrained liner and new hip ball.  IMPLANTS:  [employer] De[employer] constrained liner, size 36 x 58, +4 with 10-degree lip, size 36+15.5 metal hip ball.  SURGEON:  Lind Guest. Ninfa Linden, MD  ASSISTANT:  Erskine Emery, PA-C  ANESTHESIA:  General.  ANTIBIOTICS:  One gram IV vancomycin.  ESTIMATED BLOOD LOSS:  200 mL.  COMPLICATIONS:  None.  INDICATIONS:  The patient is a 68 year old gentleman well known to me.  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  He also has debilitating arthritis involving his left hip.  He was taken to the operating room  earlier this year for a left total hip arthroplasty, which was a primary hip.  Recognizing his severe kyphosis and having a fused spine to his pelvis, we knew that we needed to antivert the stem and acetabular component more than we normally would.  His  pelvis is basically in a flexed position and does not flex with him going from a sitting to standing position.  With that being said, he dislocated his total hip, and I had to take him back to the operating room and revise the acetabular component.  He  dislocated that again, and then I went back and did 1 more revision, really anteverting the cup as much as I could feel safe to do.  He had done well for 6 weeks, and I did obtain a CT scan showing that the acetabular component was becoming bone ingrown.   Unfortunately, he then dislocated again a few days ago.  At this point, I feel it would be worth trying a constrained lip liner with  a slightly longer hip ball to see if that can provide the stability that we need with the other option going with  eventually a dual mobility cup system from a different vendor.  I would like to leave that as a last option trying to preserve bone stock.  I explained this to him in detail, and he does understand the need to proceed with surgery and does wish to do so  given the instability of his hip.  DESCRIPTION OF PROCEDURE:  After informed consent was obtained and appropriate left hip was marked, he was brought to the operating room and placed supine on the operating table.  General anesthesia was then obtained.  We had him with traction boots on  both his feet.  Of note, preoperatively, he is longer on that left operative side.  Once we got him on the operating table and general anesthesia was obtained, we then assessed his left hip under direct fluoroscopy.  Surprisingly, with him supine it is  incredibly difficult to dislocate, and I could not dislocate his hip with him completely flat in the supine position.  We then prescrubbed his hip and then prepped it with DuraPrep and sterile drapes.  A time-out was called, and he was identified as  correct patient, correct left hip.  We then made an incision over his previous anterior hip incision and dissected down to the hip joint itself.  We found a large seroma, but no evidence of infection.  We then  irrigated 3 [name] of normal saline solution  through the arthrotomy and the soft tissues.  Once we dried this out, we were able to pull traction with a bone hook, externally rotate and dislocate his hip which again was quite difficult even to do anterior.  This was also after assessing it through  range of motion and found it to be still stable.  Once we dislocated the hip, we removed the previous hip ball.  I then removed the acetabular liner.  We assessed the acetabulum itself and assessed it under direct fluoroscopy.  I felt that we had  appropriate  anteversion and inclination.  We then irrigated around the cup again, and we went with our new constrained liner, placing a 36 x 58 +4 liner with a 10-degree lip, putting this in the appropriate posterior position.  Once we got this in place,  we placed the 15+5 metal hip ball onto the trunnion and knocked this into place.  We then were able to pull out traction and reduce the hip.  We then placed the constrained ring line around this.  This was all done under direct fluoroscopy and  visualization.  We then assessed the hip stability wise, and I could not dislocate the hip.  We then irrigated the soft tissue again with another 3 [name] of normal saline solution.  I then dried this out real well and placed vancomycin powder in the  arthrotomy.  We closed the deep tissue with interrupted #1 Ethibond suture, followed by 2-0 Vicryl on subcutaneous tissue and interrupted 2-0 nylon on the skin.  Xeroform and Aquacel dressing were applied.  He was taken off the [facility] table.  A knee  immobilizer was placed.  He was awakened, extubated, and taken to recovery room in stable condition.  All final counts were correct.  There were no complications noted.  Postoperatively, we are going to continue the knee immobilizer with weightbearing as  tolerated and posterior hip precautions.  Of note, Benita Stabile, PA-C, assisted the entire case.  His assistance was crucial for facilitating all aspects of this case.  LN/NUANCE  D:11/02/2018 T:11/02/2018 JOB:[id number]

## 2018-11-02 NOTE — Transfer of Care (Signed)
Immediate Anesthesia Transfer of Care Note  Patient: [name]  Procedure(s) Performed: LEFT HIP CONSTRAINED LINER REVISION (Left Hip)  Patient Location: PACU  Anesthesia Type:General  Level of Consciousness: awake, alert  and oriented  Airway & Oxygen Therapy: Patient Spontanous Breathing and Patient connected to face mask oxygen  Post-op Assessment: Report given to RN and Post -op Vital signs reviewed and stable  Post vital signs: Reviewed and stable  Last Vitals:  Vitals Value Taken Time  BP 106/60 11/02/18 1421  Temp    Pulse 93 11/02/18 1424  Resp 16 11/02/18 1424  SpO2 92 % 11/02/18 1424  Vitals shown include unvalidated device data.  Last Pain:  Vitals:   11/02/18 1110  TempSrc:   PainSc: 0-No pain         Complications: No apparent anesthesia complications

## 2018-11-02 NOTE — Anesthesia Procedure Notes (Signed)
Procedure Name: Intubation Date/Time: 11/02/2018 12:05 PM Performed by: Alain Marion, CRNA Pre-anesthesia Checklist: Patient identified, Emergency Drugs available, Suction available and Patient being monitored Patient Re-evaluated:Patient Re-evaluated prior to induction Oxygen Delivery Method: Circle System Utilized Preoxygenation: Pre-oxygenation with 100% oxygen Induction Type: IV induction Ventilation: Mask ventilation without difficulty Laryngoscope Size: Miller and 2 Grade View: Grade I Tube type: Oral Tube size: 7.5 mm Number of attempts: 1 Airway Equipment and Method: Stylet and Oral airway Placement Confirmation: ETT inserted through vocal cords under direct vision,  positive ETCO2 and breath sounds checked- equal and bilateral Secured at: 21 cm Tube secured with: Tape Dental Injury: Teeth and Oropharynx as per pre-operative assessment

## 2018-11-02 NOTE — Brief Op Note (Signed)
10/31/2018 - 11/02/2018  1:50 PM  PATIENT:  [name]  69 y.o. male  PRE-OPERATIVE DIAGNOSIS:  unstable left hip  POST-OPERATIVE DIAGNOSIS:  unstable left hip  PROCEDURE:  Procedure(s): LEFT HIP CONSTRAINED LINER REVISION (Left)  SURGEON:  Surgeon(s) and Role:    Mcarthur Rossetti, MD - Primary  PHYSICIAN ASSISTANT: Benita Stabile, PA-C  ANESTHESIA:   general  EBL:  200 mL   COUNTS:  YES  DICTATION: .Other Dictation: Dictation Number [id number]  PLAN OF CARE: Admit to inpatient   PATIENT DISPOSITION:  PACU - hemodynamically stable.   Delay start of Pharmacological VTE agent (>24hrs) due to surgical blood loss or risk of bleeding: no

## 2018-11-02 NOTE — Progress Notes (Signed)
RN called and updated pt's daughter, [name], of pt status. All questions answered to satisfaction. Will continue to monitor.

## 2018-11-02 NOTE — Anesthesia Postprocedure Evaluation (Signed)
Anesthesia Post Note  Patient: [name]  Procedure(s) Performed: LEFT HIP CONSTRAINED LINER REVISION (Left Hip)     Patient location during evaluation: PACU Anesthesia Type: General Level of consciousness: awake and alert Pain management: pain level controlled Vital Signs Assessment: post-procedure vital signs reviewed and stable Respiratory status: spontaneous breathing, nonlabored ventilation, respiratory function stable and patient connected to nasal cannula oxygen Cardiovascular status: blood pressure returned to baseline and stable Postop Assessment: no apparent nausea or vomiting Anesthetic complications: no    Last Vitals:  Vitals:   11/02/18 1505 11/02/18 1514  BP: 116/78 110/74  Pulse: 86 76  Resp: 18 17  Temp:  37 C  SpO2: 95% 95%    Last Pain:  Vitals:   11/02/18 1552  TempSrc:   PainSc: 0-No pain                 Roxan Yamamoto

## 2018-11-02 NOTE — Progress Notes (Signed)
   11/02/18 1300  PT Visit Information  Last PT Received On 11/02/18  Reason Eval/Treat Not Completed Patient at procedure or test/unavailable   Will recheck tomorrow and will need new orders for resumption of PT given pt is having stabilizing surgery on L hip today.  Mee Hives, PT MS Acute Rehab Dept. Number: [place] and [place]

## 2018-11-03 ENCOUNTER — Encounter (HOSPITAL_COMMUNITY): Payer: Self-pay | Admitting: *Deleted

## 2018-11-03 LAB — CBC
HCT: 35.3 % — ABNORMAL LOW (ref 38.5–50.0)
HCT: 24.7 % — ABNORMAL LOW (ref 39.0–52.0)
Hemoglobin: 11.2 g/dL — ABNORMAL LOW (ref 13.2–17.1)
Hemoglobin: 7.3 g/dL — ABNORMAL LOW (ref 13.0–17.0)
MCH: 30.2 pg (ref 27.0–33.0)
MCH: 29.3 pg (ref 26.0–34.0)
MCHC: 31.7 g/dL — ABNORMAL LOW (ref 32.0–36.0)
MCHC: 29.6 g/dL — ABNORMAL LOW (ref 30.0–36.0)
MCV: 95.1 fL (ref 80.0–100.0)
MCV: 99.2 fL (ref 80.0–100.0)
MPV: 10.1 fL (ref 7.5–12.5)
Platelets: 209 10*3/uL (ref 140–400)
Platelets: 121 10*3/uL — ABNORMAL LOW (ref 150–400)
RBC: 3.71 10*6/uL — ABNORMAL LOW (ref 4.20–5.80)
RBC: 2.49 MIL/uL — ABNORMAL LOW (ref 4.22–5.81)
RDW: 15.2 % — ABNORMAL HIGH (ref 11.0–15.0)
RDW: 16 % — ABNORMAL HIGH (ref 11.5–15.5)
WBC: 11.3 10*3/uL — ABNORMAL HIGH (ref 3.8–10.8)
WBC: 7.4 10*3/uL (ref 4.0–10.5)
nRBC: 0 % (ref 0.0–0.2)

## 2018-11-03 LAB — HIV-1 RNA QUANT-NO REFLEX-BLD
HIV 1 RNA Quant: <20 copies/mL
HIV-1 RNA Quant, Log: <1.3 Log copies/mL

## 2018-11-03 LAB — LIPID PANEL
Cholesterol: 162 mg/dL (ref <200)
HDL: 35 mg/dL — ABNORMAL LOW (ref >=40)
LDL Cholesterol (Calc): 102 mg/dL (calc) — ABNORMAL HIGH
Non-HDL Cholesterol (Calc): 127 mg/dL (calc) (ref <130)
Total CHOL/HDL Ratio: 4.6 (calc) (ref <5.0)
Triglycerides: 152 mg/dL — ABNORMAL HIGH (ref <150)

## 2018-11-03 LAB — COMPREHENSIVE METABOLIC PANEL
AG Ratio: 1.7 (calc) (ref 1.0–2.5)
ALT: 9 U/L (ref 9–46)
AST: 15 U/L (ref 10–35)
Albumin: 3.2 g/dL — ABNORMAL LOW (ref 3.6–5.1)
Alkaline phosphatase (APISO): 97 U/L (ref 35–144)
BUN: 17 mg/dL (ref 7–25)
CO2: 27 mmol/L (ref 20–32)
Calcium: 8.4 mg/dL — ABNORMAL LOW (ref 8.6–10.3)
Chloride: 102 mmol/L (ref 98–110)
Creat: 0.83 mg/dL (ref 0.70–1.18)
Globulin: 1.9 g/dL (calc) (ref 1.9–3.7)
Glucose, Bld: 83 mg/dL (ref 65–99)
Potassium: 3.5 mmol/L (ref 3.5–5.3)
Sodium: 140 mmol/L (ref 135–146)
Total Bilirubin: 0.5 mg/dL (ref 0.2–1.2)
Total Protein: 5.1 g/dL — ABNORMAL LOW (ref 6.1–8.1)

## 2018-11-03 LAB — BASIC METABOLIC PANEL
Anion gap: 8 (ref 5–15)
BUN: 10 mg/dL (ref 8–23)
CO2: 25 mmol/L (ref 22–32)
Calcium: 7.9 mg/dL — ABNORMAL LOW (ref 8.9–10.3)
Chloride: 107 mmol/L (ref 98–111)
Creatinine, Ser: 0.8 mg/dL (ref 0.61–1.24)
GFR calc Af Amer: >60 mL/min (ref >60)
GFR calc non Af Amer: >60 mL/min (ref >60)
Glucose, Bld: 131 mg/dL — ABNORMAL HIGH (ref 70–99)
Potassium: 3.4 mmol/L — ABNORMAL LOW (ref 3.5–5.1)
Sodium: 140 mmol/L (ref 135–145)

## 2018-11-03 LAB — RPR: RPR Ser Ql: NONREACTIVE

## 2018-11-03 LAB — PREPARE RBC (CROSSMATCH)

## 2018-11-03 MED ORDER — SODIUM CHLORIDE 0.9% IV SOLUTION
Freq: Once | INTRAVENOUS | Status: AC
  Administered 2018-11-03: 11:00:00 via INTRAVENOUS

## 2018-11-03 NOTE — Progress Notes (Signed)
Blood transfusion completed, one unit of PRBCs. No transfusion reaction noted. Pt's daughter [name] updated today. Questions answered.

## 2018-11-03 NOTE — Care Management Important Message (Signed)
Important Message  Patient Details  Name: [name] MRN: [id number] Date of Birth: August 16, 1950   Medicare Important Message Given:  Yes     Memory Argue 11/03/2018, 1:51 PM

## 2018-11-03 NOTE — Progress Notes (Signed)
PT Cancellation Note  Patient Details Name: [name] MRN: [id number] DOB: 10-02-1950   Cancelled Treatment:    Reason Eval/Treat Not Completed: Medical issues which prohibited therapy.  Had just had blood started and was still in the progression of transfusion when PT attempted x 2, and will reattempt after lunch.   Ramond Dial 11/03/2018, 12:52 PM  Mee Hives, PT MS Acute Rehab Dept. Number: [place] and [place]

## 2018-11-03 NOTE — Progress Notes (Signed)
Subjective: 1 Day Post-Op Procedure(s) (LRB): LEFT HIP CONSTRAINED LINER REVISION (Left) Patient reports pain as moderate.  Acute blood loss anemia.  Objective: Vital signs in last 24 hours: Temp:  [97 F (36.1 C)-98.6 F (37 C)] 97.8 F (36.6 C) (06/24 0433) Pulse Rate:  [71-96] 71 (06/24 0433) Resp:  [14-20] 16 (06/24 0433) BP: (106-142)/(60-80) 124/70 (06/24 0433) SpO2:  [93 %-99 %] 96 % (06/24 0433)  Intake/Output from previous day: 06/23 0701 - 06/24 0700 In: 2732.8 [P.O.:580; I.V.:2152.8] Out: 2325 [Urine:2125; Blood:200] Intake/Output this shift: No intake/output data recorded.  Recent Labs    11/03/18 0521  HGB 7.3*   Recent Labs    11/03/18 0521  WBC 7.4  RBC 2.49*  HCT 24.7*  PLT 121*   Recent Labs    11/03/18 0521  NA 140  K 3.4*  CL 107  CO2 25  BUN 10  CREATININE 0.80  GLUCOSE 131*  CALCIUM 7.9*   No results for input(s): LABPT, INR in the last 72 hours.  Sensation intact distally Intact pulses distally Dorsiflexion/Plantar flexion intact Incision: dressing C/D/I   Assessment/Plan: 1 Day Post-Op Procedure(s) (LRB): LEFT HIP CONSTRAINED LINER REVISION (Left) Up with therapy - WBAT left hip POSTERIOR HIP PRECAUTIONS  Will transfuse one unit of blood today due to acute blood loss anemia. Can be up with therapy      [name] 11/03/2018, 7:18 AM

## 2018-11-03 NOTE — Progress Notes (Signed)
Physical Therapy Treatment Patient Details Name: [name] MRN: [id number] DOB: 1950-07-16 Today's Date: 11/03/2018    History of Present Illness 68 yo male with onset of dislocation of L hip while sitting still in a restaurant was admitted for closed reduction on 6/21, then replacement of the L femoral head and installation of a constrained liner, now referred to PT.  Had a THA on 4/3 for avascular necrosis, dislocated 4/5 with closed reduction, revised surgery 4/7, sent home with another dislocation and another closed reduction 4/18.  Dislocated 5/6, L THA revised 5/8.  68 yo male with onset of dislocation of L hip while sitting still in a restaurant was admitted for closed reduction on 6/21, now referred to PT.  Had a THA on 4/3 for avascular necrosis, dislocated 4/5 with closed reduction, revised surgery 4/7, sent home with another dislocation and another closed reduction 4/18.  Dislocated 5/6, L THA revised 5/8.  s in the right lateral abdominal wall.    PT Comments    Pt was seen for further work today with pt demonstrating better control of L hip in standing.  Verbal prompts were given regarding precautions, and pt stated he was not permitted to abd hip.  Talked with MD via messaging and were given OK for physical task of abduction exercises on L hip.  Follow acutely for gait training with RW, control of L hip with standing and transitions to sit or stand, and education about avoidance of postures that could jeopardize the L hip stability.      Follow Up Recommendations  Follow surgeon's recommendation for DC plan and follow-up therapies;Supervision for mobility/OOB     Equipment Recommendations  None recommended by PT(pt has a walker like he will need)    Recommendations for Other Services OT consult     Precautions / Restrictions Precautions Precautions: Fall Precaution Comments: new posterior precautions L hip Restrictions Weight Bearing Restrictions: Yes LLE Weight  Bearing: Weight bearing as tolerated    Mobility  Bed Mobility Overal bed mobility: Needs Assistance             General bed mobility comments: pt asked to remain up in the chair  Transfers Overall transfer level: Needs assistance Equipment used: Rolling walker (2 wheeled);1 person hand held assist Transfers: Sit to/from Stand Sit to Stand: Min assist         General transfer comment: reminders to use correct hand placement with chair  Ambulation/Gait Ambulation/Gait assistance: Min guard;Min assist Gait Distance (Feet): 50 Feet Assistive device: Rolling walker (2 wheeled);1 person hand held assist Gait Pattern/deviations: Step-through pattern;Step-to pattern;Shuffle;Decreased stride length;Decreased weight shift to left Gait velocity: reduced Gait velocity interpretation: <1.8 ft/sec, indicate of risk for recurrent falls General Gait Details: pt has increased back pain because he uses an Multimedia programmer at home but did not share this prior to walking.  Had relief of pain with sitting in correct alignment   Stairs             Wheelchair Mobility    Modified Rankin (Stroke Patients Only)       Balance Overall balance assessment: Needs assistance Sitting-balance support: Feet supported Sitting balance-[name]: Fair     Standing balance support: Bilateral upper extremity supported;During functional activity Standing balance-[name]: Poor                              Cognition Arousal/Alertness: Awake/alert Behavior During Therapy: WFL for tasks  assessed/performed Overall Cognitive Status: Within Functional Limits for tasks assessed                                 General Comments: pt thinks he should not abd L hip but MD declines to validate this.      Exercises Total Joint Exercises Ankle Circles/Pumps: AAROM;AROM;Both;5 reps Quad Sets: AROM;Both;10 reps Gluteal Sets: AROM;Both;10 reps Hip ABduction/ADduction:  AAROM;Both;10 reps;AROM    General Comments General comments (skin integrity, edema, etc.): pt is up in chair when PT session ends with chair reclined to match posterior hip precautions, with LE's elevated on chair      Pertinent Vitals/Pain Pain Assessment: 0-10 Pain Score: 9  Pain Location: L hip Pain Descriptors / Indicators: Sore Pain Intervention(s): Limited activity within patient's tolerance;Monitored during session;Premedicated before session;Repositioned    Home Living                      Prior Function            PT Goals (current goals can now be found in the care plan section) Acute Rehab PT Goals Patient Stated Goal: walk and get L hip stable Progress towards PT goals: Progressing toward goals    Frequency    Min 5X/week      PT Plan Frequency needs to be updated    Co-evaluation              AM-PAC PT "6 Clicks" Mobility   Outcome Measure  Help needed turning from your back to your side while in a flat bed without using bedrails?: A Little Help needed moving from lying on your back to sitting on the side of a flat bed without using bedrails?: A Little Help needed moving to and from a bed to a chair (including a wheelchair)?: A Little Help needed standing up from a chair using your arms (e.g., wheelchair or bedside chair)?: A Little Help needed to walk in hospital room?: A Little Help needed climbing 3-5 steps with a railing? : A Lot 6 Click Score: 17    End of Session Equipment Utilized During Treatment: Gait belt Activity Tolerance: Patient tolerated treatment well;Patient limited by fatigue;Patient limited by pain(spinal pain, cues for hip precautions with turns) Patient left: in chair;with call bell/phone within reach;with chair alarm set Nurse Communication: Mobility status;Other (comment)(MD note to reiterate about hip precautions with abd L hip OK) PT Visit Diagnosis: Unsteadiness on feet (R26.81);Muscle weakness (generalized)  (M62.81);Difficulty in walking, not elsewhere classified (R26.2);Pain;Other (comment) Pain - Right/Left: Left Pain - part of body: Hip     Time: 8338-2505 PT Time Calculation (min) (ACUTE ONLY): 33 min  Charges:  $Gait Training: 8-22 mins $Therapeutic Exercise: 8-22 mins                    Ramond Dial 11/03/2018, 5:37 PM  Mee Hives, PT MS Acute Rehab Dept. Number: [place] and [place]

## 2018-11-04 LAB — TYPE AND SCREEN
ABO/RH(D): A POS
Antibody Screen: NEGATIVE
Unit division: 0

## 2018-11-04 LAB — BPAM RBC
Blood Product Expiration Date: 202007122359
ISSUE DATE / TIME: 202006241032
Unit Type and Rh: 6200

## 2018-11-04 NOTE — Progress Notes (Signed)
PT Cancellation Note  Patient Details Name: [name] MRN: [id number] DOB: [birth year]   Cancelled Treatment:    Reason Eval/Treat Not Completed: (P) Patient declined, no reason specified(Pt supine in bed eating dinner tray, decline pm session.  Will f/u per POC.)   Halona Amstutz Eli Hose 11/04/2018, 4:54 PM Governor Rooks, PTA Acute Rehabilitation Services Pager [phone] Office [phone]

## 2018-11-04 NOTE — Progress Notes (Signed)
1445 Bleeding noted to left hip incision, Aquacel dressing was saturated and leaked. Dr Kathrynn Speed notified. Replaced dressing with gauze and ABD pads.

## 2018-11-04 NOTE — Progress Notes (Signed)
Patient ID: [name], male   DOB: 1950-12-10, 68 y.o.   MRN: [id number] Looks much better overall. Did get one unit of blood yesterday.  Vitals stable today.  Did well with mobility yesterday.  I would like one more day to watch his incision due to drainage.  I did place a new dressing today.  Plan on discharge to home tomorrow.

## 2018-11-04 NOTE — Progress Notes (Addendum)
Physical Therapy Treatment Patient Details Name: [name] MRN: [id number] DOB: 25-Oct-1950 Today's Date: 11/04/2018    History of Present Illness 68 yo male with onset of dislocation of L hip while sitting still in a restaurant was admitted for closed reduction on 6/21, then replacement of the L femoral head and installation of a constrained liner, now referred to PT.  Had a THA on 4/3 for avascular necrosis, dislocated 4/5 with closed reduction, revised surgery 4/7, sent home with another dislocation and another closed reduction 4/18.  Dislocated 5/6, L THA revised 5/8.  68 yo male with onset of dislocation of L hip while sitting still in a restaurant was admitted for closed reduction on 6/21, now referred to PT.  Had a THA on 4/3 for avascular necrosis, dislocated 4/5 with closed reduction, revised surgery 4/7, sent home with another dislocation and another closed reduction 4/18.  Dislocated 5/6, L THA revised 5/8.  s in the right lateral abdominal wall.    PT Comments    Pt performed gait training with EVA walker and reports decreased pain in his back with use.  He has difficulty maintaining the pathway of the EVA walker as it rolls smoother than his AD at home.  Pt required cues for safety to maintain hip precautions.  Will f/u in pm for afternoon tx.      Follow Up Recommendations  Follow surgeon's recommendation for DC plan and follow-up therapies;Supervision for mobility/OOB     Equipment Recommendations  None recommended by PT    Recommendations for Other Services       Precautions / Restrictions Precautions Precautions: Fall Precaution Comments: new posterior precautions L hip Required Braces or Orthoses: Other Brace;Knee Immobilizer - Left Knee Immobilizer - Left: On at all times Restrictions Weight Bearing Restrictions: Yes LLE Weight Bearing: Weight bearing as tolerated    Mobility  Bed Mobility Overal bed mobility: Needs Assistance Bed Mobility: Supine to Sit      Supine to sit: Supervision     General bed mobility comments: Pt able to move to edge of bed unassisted with increased time and effort.  Transfers Overall transfer level: Needs assistance Equipment used: 4-wheeled walker;Bilateral platform walker(EVA walker) Transfers: Sit to/from Stand Sit to Stand: Min guard;From elevated surface         General transfer comment: Cues for hand placement and LLE placement to achieve standing from elevated surface height.  Ambulation/Gait Ambulation/Gait assistance: Min guard, min assistance Gait Distance (Feet): 65 Feet Assistive device: 4-wheeled walker;Bilateral platform walker(EVA walker) Gait Pattern/deviations: Step-through pattern;Step-to pattern;Shuffle;Decreased stride length;Decreased weight shift to left     General Gait Details: Pt reports improved tolerance with B platform RW.  He required cues for upper trunk control and for turns to avoid pivot on the L side.   Stairs             Wheelchair Mobility    Modified Rankin (Stroke Patients Only)       Balance Overall balance assessment: Needs assistance Sitting-balance support: Feet supported Sitting balance-[name]: Fair     Standing balance support: Bilateral upper extremity supported;During functional activity                                Cognition Arousal/Alertness: Awake/alert Behavior During Therapy: WFL for tasks assessed/performed Overall Cognitive Status: Within Functional Limits for tasks assessed  Exercises      General Comments        Pertinent Vitals/Pain Pain Assessment: 0-10 Pain Score: 9  Faces Pain Scale: Hurts a little bit Pain Location: L hip Pain Descriptors / Indicators: Sore Pain Intervention(s): Monitored during session;Repositioned;Ice applied    Home Living                      Prior Function            PT Goals (current goals can now  be found in the care plan section) Acute Rehab PT Goals Patient Stated Goal: walk and get L hip stable PT Goal Formulation: With patient Potential to Achieve Goals: Good Progress towards PT goals: Progressing toward goals    Frequency    Min 5X/week      PT Plan Current plan remains appropriate    Co-evaluation              AM-PAC PT "6 Clicks" Mobility   Outcome Measure  Help needed turning from your back to your side while in a flat bed without using bedrails?: A Little Help needed moving from lying on your back to sitting on the side of a flat bed without using bedrails?: A Little Help needed moving to and from a bed to a chair (including a wheelchair)?: A Little Help needed standing up from a chair using your arms (e.g., wheelchair or bedside chair)?: A Little Help needed to walk in hospital room?: A Little Help needed climbing 3-5 steps with a railing? : A Little 6 Click Score: 18    End of Session Equipment Utilized During Treatment: Gait belt Activity Tolerance: Patient tolerated treatment well;Patient limited by fatigue;Patient limited by pain Patient left: in chair;with call bell/phone within reach;with chair alarm set Nurse Communication: Mobility status;Other (comment) PT Visit Diagnosis: Unsteadiness on feet (R26.81);Muscle weakness (generalized) (M62.81);Difficulty in walking, not elsewhere classified (R26.2);Pain;Other (comment) Pain - Right/Left: Left Pain - part of body: Hip     Time: 2585-2778 PT Time Calculation (min) (ACUTE ONLY): 23 min  Charges:  $Gait Training: 8-22 mins $Therapeutic Activity: 8-22 mins                     Governor Rooks, PTA Acute Rehabilitation Services Pager [phone] Office [phone]     Jessyka Austria Eli Hose 11/04/2018, 1:12 PM

## 2018-11-04 NOTE — Progress Notes (Signed)
Occupational Therapy Evaluation Patient Details Name: QUARAN KEDZIERSKI MRN: [id number] DOB: 1950-12-04 Today's Date: 11/04/2018    History of Present Illness 68 yo male with onset of dislocation of L hip while sitting still in a restaurant was admitted for closed reduction on 6/21, then replacement of the L femoral head and installation of a constrained liner, now referred to PT.  Had a THA on 4/3 for avascular necrosis, dislocated 4/5 with closed reduction, revised surgery 4/7, sent home with another dislocation and another closed reduction 4/18.  Dislocated 5/6, L THA revised 5/8.  68 yo male with onset of dislocation of L hip while sitting still in a restaurant was admitted for closed reduction on 6/21, now referred to PT.  Had a THA on 4/3 for avascular necrosis, dislocated 4/5 with closed reduction, revised surgery 4/7, sent home with another dislocation and another closed reduction 4/18.  Dislocated 5/6, L THA revised 5/8.  s in the right lateral abdominal wall.   Clinical Impression   Pt admitted with diagnosis. PTA pt PLOF Mod I in ADLs with increased and use of AE in home setting. Pt reports living at home but having daughter available rn. Pt currently requires Mod I for LB dressing with the use of AE, and min guard for functional transfer. Pt educated on how to don/doff socks with sock aid with demonstration and teach back. Pt will benefit from continued acute OT to maximize independence in ADLs prior to d/c setting.     Follow Up Recommendations  Follow surgeon's recommendation for DC plan and follow-up therapies;Supervision/Assistance - 24 hour    Equipment Recommendations  3 in 1 bedside commode    Recommendations for Other Services       Precautions / Restrictions Precautions Precautions: Fall Precaution Comments: new posterior precautions L hip Required Braces or Orthoses: Other Brace;Knee Immobilizer - Left Knee Immobilizer - Left: On at all times Restrictions Weight Bearing  Restrictions: Yes LLE Weight Bearing: Weight bearing as tolerated      Mobility Bed Mobility Overal bed mobility: Needs Assistance Bed Mobility: Supine to Sit     Supine to sit: Supervision     General bed mobility comments: Pt able to move to edge of bed unassisted with increased time and effort.  Transfers Overall transfer level: Needs assistance Equipment used: 4-wheeled walker;Bilateral platform walker(EVA walker) Transfers: Sit to/from Stand Sit to Stand: Min guard;From elevated surface         General transfer comment: Cues for hand placement and LLE placement to achieve standing from elevated surface height.    Balance Overall balance assessment: Needs assistance Sitting-balance support: Feet supported Sitting balance-[name]: Fair     Standing balance support: Bilateral upper extremity supported;During functional activity Standing balance-[name]: Poor Standing balance comment: less than fair dynamically                           ADL either performed or assessed with clinical judgement   ADL Overall ADL's : Needs assistance/impaired Eating/Feeding: Set up;Sitting   Grooming: Wash/dry hands;Wash/dry face;Oral care;Set up;Sitting   Upper Body Bathing: Independent;Sitting   Lower Body Bathing: Modified independent;With adaptive equipment;Sitting/lateral leans   Upper Body Dressing : Independent;Sitting   Lower Body Dressing: Modified independent;Sitting/lateral leans;With adaptive equipment               Functional mobility during ADLs: Min guard;Minimal assistance General ADL Comments: assistance required for safety and VCs for hand placement. Pt motivated  to learn how to use sock, educated with demonstration and teach back.      Vision         Perception     Praxis      Pertinent Vitals/Pain Pain Assessment: No/denies pain Pain Score: 9  Faces Pain Scale: No hurt Pain Location: L hip Pain Descriptors / Indicators:  Sore Pain Intervention(s): Monitored during session;Repositioned     Hand Dominance Right   Extremity/Trunk Assessment Upper Extremity Assessment Upper Extremity Assessment: Overall WFL for tasks assessed   Lower Extremity Assessment Lower Extremity Assessment: Defer to PT evaluation LLE Deficits / Details: L hip is weak but pt can lift it to move off the bed LLE: Unable to fully assess due to immobilization LLE Coordination: decreased gross motor   Cervical / Trunk Assessment Cervical / Trunk Assessment: Other exceptions(has lumbar spinal fusion that stresses his L hip joint)   Communication Communication Communication: HOH;Other (comment)(has B hearing aids)   Cognition Arousal/Alertness: Awake/alert Behavior During Therapy: WFL for tasks assessed/performed Overall Cognitive Status: Within Functional Limits for tasks assessed                                 General Comments: pt able to state all precautions   General Comments  pt received in bed upon [facility] with [facility]    Exercises     Shoulder Instructions      Home Living Family/patient expects to be discharged to:: Private residence Living Arrangements: Alone Available Help at Discharge: Family;Available 24 hours/day Type of Home: House Home Access: Stairs to enter [employer] of Steps: 4 Entrance Stairs-Rails: Right Home Layout: One level     Bathroom Shower/Tub: Occupational psychologist: Standard Bathroom Accessibility: Yes   Home Equipment: Grab bars - toilet;Walker - 2 wheels;Shower seat   Additional Comments: has family to help at home      Prior Functioning/Environment Level of Independence: Independent with assistive device(s)  Gait / Transfers Assistance Needed: was out at [place] with no brace, using RW ADL's / Homemaking Assistance Needed: has been able to do dressing and bathing at home   Comments: used cane for ambulation         OT Problem List:         OT Treatment/Interventions: Self-care/ADL training;Therapeutic exercise;DME and/or AE instruction;Therapeutic activities;Patient/family education;Balance training    OT Goals(Current goals can be found in the care plan section) Acute Rehab OT Goals Patient Stated Goal: walk and get L hip stable OT Goal Formulation: With patient Time For Goal Achievement: 11/18/18 Potential to Achieve Goals: Good  OT Frequency: Min 2X/week   Barriers to D/C:            Co-evaluation              AM-PAC OT "6 Clicks" Daily Activity     Outcome Measure Help from another person eating meals?: None Help from another person taking care of personal grooming?: None Help from another person toileting, which includes using toliet, bedpan, or urinal?: A Little Help from another person bathing (including washing, rinsing, drying)?: A Little Help from another person to put on and taking off regular upper body clothing?: None Help from another person to put on and taking off regular lower body clothing?: A Little 6 Click Score: 21   End of Session Equipment Utilized During Treatment: Gait belt;Rolling walker;Left knee immobilizer Nurse Communication: Mobility status;Weight bearing status;Precautions  Activity  Tolerance: Patient tolerated treatment well Patient left: in bed;with call bell/phone within reach  OT Visit Diagnosis: Unsteadiness on feet (R26.81);Pain                Time: 4114-6431 OT Time Calculation (min): 17 min Charges:  OT General Charges $OT Visit: 1 Visit OT Evaluation $OT Eval Low Complexity: [place], MSOT, OTR/L  Supplemental Rehabilitation Services  [phone]   Marius Ditch 11/04/2018, 3:56 PM

## 2018-11-05 ENCOUNTER — Telehealth: Payer: Self-pay | Admitting: *Deleted

## 2018-11-05 MED ORDER — HYDROCODONE-ACETAMINOPHEN 7.5-325 MG PO TABS: 1.0000 | ORAL_TABLET | Freq: Four times a day (QID) | ORAL | 0 refills | Status: DC | PRN

## 2018-11-05 NOTE — Progress Notes (Signed)
Pt wants to go home this morning. Left hip incision with Prevena wound vac dressing dry and intact. Discharge instructions given to pt. Discharged to home picked up by daughter.

## 2018-11-05 NOTE — Plan of Care (Signed)

## 2018-11-05 NOTE — Telephone Encounter (Signed)
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 Pt underwent LEFT HIP CONSTRAINED LINER REVISION . Procedure went well, and he was D/c 11/05/18. Pt will follow-up w/surgeon Dr. Ninfa Linden in 2 weeks.Marland KitchenJohny Chess

## 2018-11-05 NOTE — Discharge Summary (Signed)
Patient ID: [name] MRN: [id number] DOB/AGE: [birth year] 68 y.o.  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 Allergy   . Anxiety   . Carotid artery occlusion    40-60% right ICA stenosis (09/2008)  . Cataract   . Chronic back pain   . CLL (chronic lymphoblastic leukemia) dx 2010   Followed at [facility] q57mo, no current therapy   . Clotting disorder ([place])   . Coronary artery disease 2010   s/p CABG '10, sees Dr. Percival Spanish  . Depression   . Diverticulosis   . DVT, lower extremity, recurrent ([place]) 2008, 2009   LLE, chronic anticoag since 2009  . Esophagitis   . Fibromyalgia   . Gallstones   . GERD (gastroesophageal reflux disease)   . Gout   . Gynecomastia, male   . H/O hiatal hernia 2008   surgery  . Hemorrhoids   . Hepatitis A yrs ago  . HIV infection ([place]) dx 1993  . Hypertension   . Impotence of organic origin   . Myocardial infarction ([place]) 2010    x 2  . Neuromuscular disorder (HCC)    neuropathy  . Osteoarthritis, knee    s/p B TKA  . Osteoporosis   . Pneumonia mrach, may, july 2017  . Rheumatoid arthritis(714.0) dx 2010   MTX, follows with rheum  . Seasonal allergies   . Secondary syphilis 07/24/14 dx   s/p 2 wks doxy  . Status post dilation of esophageal narrowing   . Stroke ([place]) [place]   . TIA (transient ischemic attack) 1997   mild residual L mouth droop  . Tubular adenoma of colon     Surgeries: Procedure(s): LEFT HIP CONSTRAINED LINER REVISION on 11/02/2018   Consultants: Treatment Team:  Mcarthur Rossetti, MD  Discharged Condition: Improved  Hospital Course: [name] is an 68 y.o. male who was admitted 10/31/2018 for operative treatment of<principal problem not  specified>. Patient has severe unremitting pain that affects sleep, daily activities, and work/hobbies. After pre-op clearance the patient was taken to the operating room on 11/02/2018 and underwent  Procedure(s): LEFT HIP CONSTRAINED LINER REVISION.    Patient was given perioperative antibiotics:  Anti-infectives (From admission, onward)   Start     Dose/Rate Route Frequency Ordered Stop   11/02/18 2200  vancomycin (VANCOCIN) IVPB 1000 mg/200 mL premix     1,000 mg 200 mL/hr over 60 Minutes Intravenous Every 12 hours 11/02/18 1523 11/02/18 2255   11/02/18 1345  vancomycin (VANCOCIN) powder  Status:  Discontinued       As needed 11/02/18 1305 11/02/18 1421   11/02/18 1147  vancomycin (VANCOCIN) 1-5 GM/200ML-% IVPB    Note to Pharmacy: Judie Bonus, [place]   : cabinet override      11/02/18 1147 11/02/18 2154   11/01/18 1000  valACYclovir (VALTREX) tablet 500 mg     500 mg Oral Daily 10/31/18 2248     11/01/18 1000  sulfamethoxazole-trimethoprim (BACTRIM DS) 800-160 MG per tablet 1 tablet  Status:  Discontinued     1 tablet Oral Every 12 hours 10/31/18 2248 11/02/18 1523   11/01/18 1000  doxycycline (VIBRA-TABS) tablet 100 mg    Note to Pharmacy: 15 day course filled on  6/11   100 mg Oral 2 times daily 10/31/18 2248 11/06/18 0959   11/01/18 0800  darunavir (PREZISTA) tablet 800 mg     800 mg Oral Daily with breakfast 10/31/18 2248     11/01/18 0800  elvitegravir-cobicistat-emtricitabine-tenofovir (GENVOYA) 150-150-200-10 MG tablet 1 tablet     1 tablet Oral Daily with breakfast 10/31/18 2248         Patient was given sequential compression devices, early ambulation, and chemoprophylaxis to prevent DVT.  Patient benefited maximally from hospital stay and there were no complications.    Recent vital signs:  Patient Vitals for the past 24 hrs:  BP Temp Temp src Pulse Resp SpO2  11/05/18 0408 (!) 142/78 98.3 F (36.8 C) Oral 72 17 96 %  11/04/18 2016 118/74 98.4 F (36.9 C) Oral 84 17 97 %   11/04/18 1415 103/61 - - 87 20 98 %     Recent laboratory studies:  Recent Labs    11/03/18 0521  WBC 7.4  HGB 7.3*  HCT 24.7*  PLT 121*  NA 140  K 3.4*  CL 107  CO2 25  BUN 10  CREATININE 0.80  GLUCOSE 131*  CALCIUM 7.9*     Discharge Medications:   Allergies as of 11/05/2018      Reactions   Golimumab Anaphylaxis   Simponi ARIA   Orencia [abatacept] Anaphylaxis   Other Anaphylaxis, Hives   Pecans   Peanut-containing Drug Products Anaphylaxis, Hives, Swelling   Swelling of throat   Morphine Other (See Comments)   Severe headache- Can tolerate Dilaudid, however   Oxycodone-acetaminophen Other (See Comments)   Headache 6.22.2020 patient is currently taking and tolerating   Penicillins Rash, Other (See Comments)   FLUSHED & RED, also Has patient had a PCN reaction causing immediate rash, facial/tongue/throat swelling, SOB or lightheadedness with hypotension: Yes Has patient had a PCN reaction causing severe rash involving mucus membranes or skin necrosis: No Has patient had a PCN reaction that required hospitalization: No Has patient had a PCN reaction occurring within the last 10 years: No If all of the above answers are "NO", then may proceed with Cephalosporin use   Promethazine Hcl Other (See Comments)   Makes him feel "drunk" at higher strengths      Medication List    TAKE these medications   acetaminophen 500 MG tablet Commonly known as: TYLENOL Take 500-1,000 mg by mouth every 6 (six) hours as needed for mild pain or moderate pain.   atorvastatin 10 MG tablet Commonly known as: LIPITOR Take 1 tablet (10 mg total) by mouth at bedtime.   doxycycline 100 MG tablet Commonly known as: VIBRA-TABS Take 1 tablet (100 mg total) by mouth 2 (two) times daily.   EpiPen 2-Pak 0.3 mg/0.3 mL Soaj injection Generic drug: EPINEPHrine Inject 0.3 mg into the muscle as needed for anaphylaxis.   escitalopram 20 MG tablet Commonly known as: LEXAPRO Take 1 tablet  (20 mg total) by mouth daily.   famotidine 40 MG tablet Commonly known as: PEPCID Take 1 tablet (40 mg total) by mouth 2 (two) times daily.   folic acid 1 MG tablet Commonly known as: FOLVITE Take 1 mg by mouth daily.   furosemide 20 MG tablet Commonly known as: LASIX Take 1 tablet (20 mg total) by mouth 2 (two) times daily.   Genvoya 150-150-200-10 MG Tabs tablet Generic drug: elvitegravir-cobicistat-emtricitabine-tenofovir TAKE 1 TABLET BY MOUTH DAILY WITH BREAKFAST.   HYDROcodone-acetaminophen 7.5-325 MG tablet Commonly known as: NORCO Take  1-2 tablets by mouth every 6 (six) hours as needed for severe pain (pain score 7-10).   leflunomide 20 MG tablet Commonly known as: ARAVA Take 20 mg by mouth daily.   metoprolol succinate 25 MG 24 hr tablet Commonly known as: TOPROL-XL Take 25 mg by mouth daily.   nitroGLYCERIN 0.4 MG SL tablet Commonly known as: NITROSTAT Place 1 tablet (0.4 mg total) under the tongue every 5 (five) minutes as needed for chest pain.   ondansetron 4 MG tablet Commonly known as: ZOFRAN Take 4 mg by mouth every 6 (six) hours as needed for nausea.   predniSONE 5 MG tablet Commonly known as: DELTASONE Take 5 mg by mouth 2 (two) times daily.   pregabalin 200 MG capsule Commonly known as: LYRICA TAKE 1 CAPSULE BY MOUTH TWICE A DAY   Prezista 800 MG tablet Generic drug: darunavir TAKE 1 TABLET BY MOUTH DAILY.   Rexulti 0.25 MG Tabs Generic drug: Brexpiprazole Take 1 tablet by mouth daily.   Savaysa 30 MG Tabs tablet Generic drug: edoxaban TAKE 1 TABLET (30 MG TOTAL) BY MOUTH DAILY.   sulfamethoxazole-trimethoprim 800-160 MG tablet Commonly known as: BACTRIM DS TAKE 1 TABLET BY MOUTH EVERY 12 (TWELVE) HOURS.   valACYclovir 500 MG tablet Commonly known as: VALTREX Take 1 tablet (500 mg total) by mouth daily.            Durable Medical Equipment  (From admission, onward)         Start     Ordered   10/31/18 2249  DME Walker  rolling  Once    Question:  Patient needs a walker to treat with the following condition  Answer:  History of open reduction and internal fixation (ORIF) procedure   10/31/18 2248   10/31/18 2249  DME 3 n 1  Once     10/31/18 2248   10/31/18 2249  DME Bedside commode  Once    Question:  Patient needs a bedside commode to treat with the following condition  Answer:  History of open reduction and internal fixation (ORIF) procedure   10/31/18 2248          Diagnostic Studies: Dg Pelvis Portable  Result Date: 11/02/2018 CLINICAL DATA:  Status post left total hip replacement. EXAM: PORTABLE PELVIS 1-2 VIEWS COMPARISON:  Fluoroscopic images of same day. FINDINGS: The left acetabular and femoral components appear to be well situated status post revision of left hip arthroplasty. Expected postoperative changes are seen in the surrounding soft tissues. No fracture or dislocation is noted. IMPRESSION: Status post revision of left total hip arthroplasty. Electronically Signed   By: Marijo Conception M.D.   On: 11/02/2018 15:40   Dg C-arm 1-60 Min  Result Date: 11/02/2018 CLINICAL DATA:  Left hip replacement revision. EXAM: DG C-ARM 61-120 MIN; OPERATIVE LEFT HIP WITH PELVIS COMPARISON:  10/31/2018. FINDINGS: Revision total left hip replacement. Hardware intact. Anatomic alignment. 0 minutes 17 seconds fluoroscopy utilized. Three spot films obtained. IMPRESSION: Revision total left hip replacement.  Anatomic alignment. Electronically Signed   By: Marcello Moores  Register   On: 11/02/2018 13:52   Dg C-arm 1-60 Min  Result Date: 10/31/2018 CLINICAL DATA:  Left hip reduction. EXAM: DG C-ARM 61-120 MIN; OPERATIVE LEFT HIP WITH PELVIS COMPARISON:  Pre reduction radiographs earlier this day. FINDINGS: Three fluoroscopic spot views obtained in the operating room demonstrate interval relocation of femoral component of left hip arthroplasty. No visualized periprosthetic fracture on these fluoroscopic views. Fluoroscopy time  8 seconds. IMPRESSION: Intraoperative  fluoroscopy during reduction of left hip arthroplasty dislocation. Electronically Signed   By: Keith Rake M.D.   On: 10/31/2018 22:29   Dg Hip Port Unilat With Pelvis 1v Left  Result Date: 10/31/2018 CLINICAL DATA:  Left hip reduction. EXAM: DG HIP (WITH OR WITHOUT PELVIS) 1V PORT LEFT COMPARISON:  Radiograph earlier this day at 1327 hour FINDINGS: Image number 1 and 2 demonstrates persistent dislocation of femoral component of left hip arthroplasty. Cross-table lateral demonstrates the femoral component projecting over the acetabular cup, however this may be due to superimposition. IMPRESSION: Persistent dislocation of femoral component of left hip arthroplasty. Electronically Signed   By: Keith Rake M.D.   On: 10/31/2018 22:32   Dg Hip Operative Unilat W Or W/o Pelvis Left  Result Date: 11/02/2018 CLINICAL DATA:  Left hip replacement revision. EXAM: DG C-ARM 61-120 MIN; OPERATIVE LEFT HIP WITH PELVIS COMPARISON:  10/31/2018. FINDINGS: Revision total left hip replacement. Hardware intact. Anatomic alignment. 0 minutes 17 seconds fluoroscopy utilized. Three spot films obtained. IMPRESSION: Revision total left hip replacement.  Anatomic alignment. Electronically Signed   By: Marcello Moores  Register   On: 11/02/2018 13:52   Dg Hip Operative Unilat W Or W/o Pelvis Left  Result Date: 10/31/2018 CLINICAL DATA:  Left hip reduction. EXAM: DG C-ARM 61-120 MIN; OPERATIVE LEFT HIP WITH PELVIS COMPARISON:  Pre reduction radiographs earlier this day. FINDINGS: Three fluoroscopic spot views obtained in the operating room demonstrate interval relocation of femoral component of left hip arthroplasty. No visualized periprosthetic fracture on these fluoroscopic views. Fluoroscopy time 8 seconds. IMPRESSION: Intraoperative fluoroscopy during reduction of left hip arthroplasty dislocation. Electronically Signed   By: Keith Rake M.D.   On: 10/31/2018 22:29    Disposition:  Discharge disposition: 01-Home or Self Care         Follow-up Information    Mcarthur Rossetti, MD. Schedule an appointment as soon as possible for a visit in 2 week(s).   Specialty: Orthopedic Surgery Contact information: [place] [address] [phone]            Signed: Mcarthur Rossetti 11/05/2018, 6:53 AM

## 2018-11-05 NOTE — Progress Notes (Signed)
Physical Therapy Treatment Patient Details Name: [name] MRN: [id number] DOB: 08/27/1950 Today's Date: 11/05/2018    History of Present Illness 68 yo male with onset of dislocation of L hip while sitting still in a restaurant was admitted for closed reduction on 6/21, then replacement of the L femoral head and installation of a constrained liner, now referred to PT.  Had a THA on 4/3 for avascular necrosis, dislocated 4/5 with closed reduction, revised surgery 4/7, sent home with another dislocation and another closed reduction 4/18.  Dislocated 5/6, L THA revised 5/8.  68 yo male with onset of dislocation of L hip while sitting still in a restaurant was admitted for closed reduction on 6/21, now referred to PT.  Had a THA on 4/3 for avascular necrosis, dislocated 4/5 with closed reduction, revised surgery 4/7, sent home with another dislocation and another closed reduction 4/18.  Dislocated 5/6, L THA revised 5/8.  s in the right lateral abdominal wall.    PT Comments    Patient very motivated for d/c home this morning. Light assist for L LE management towards EOB to come into sitting. Patient requiring assist to pull up pants to maintain hip precautions. Reviewed hip precautions as well as home safety.  Patient declining further mobility in anticipation of d/c.    Follow Up Recommendations  Follow surgeon's recommendation for DC plan and follow-up therapies;Supervision for mobility/OOB     Equipment Recommendations  None recommended by PT    Recommendations for Other Services OT consult     Precautions / Restrictions Precautions Precautions: Fall Precaution Comments: new posterior precautions L hip Required Braces or Orthoses: Other Brace;Knee Immobilizer - Left Knee Immobilizer - Left: On at all times Restrictions Weight Bearing Restrictions: Yes LLE Weight Bearing: Weight bearing as tolerated    Mobility  Bed Mobility Overal bed mobility: Needs Assistance Bed Mobility:  Supine to Sit     Supine to sit: Min assist     General bed mobility comments: very light Min A to guide L LE towards EOB  Transfers Overall transfer level: Needs assistance Equipment used: Rolling walker (2 wheeled) Transfers: Sit to/from Stand Sit to Stand: Min guard;From elevated surface         General transfer comment: cueing for hand placement; mildly flexed trunk in standing; required assistance for pulling pants over buttocks  Ambulation/Gait             General Gait Details: patient declined   Marine scientist Rankin (Stroke Patients Only)       Balance Overall balance assessment: Needs assistance Sitting-balance support: Feet supported Sitting balance-[name]: Fair     Standing balance support: Bilateral upper extremity supported;During functional activity Standing balance-[name]: Poor                              Cognition Arousal/Alertness: Awake/alert Behavior During Therapy: WFL for tasks assessed/performed Overall Cognitive Status: Within Functional Limits for tasks assessed                                 General Comments: review of precautions with cueing for no bending      Exercises      General Comments General comments (skin integrity, edema, etc.): nursing presenting in room with meds and  d/c paperwork      Pertinent Vitals/Pain Pain Assessment: No/denies pain    Home Living                      Prior Function            PT Goals (current goals can now be found in the care plan section) Acute Rehab PT Goals Patient Stated Goal: walk and get L hip stable PT Goal Formulation: With patient Time For Goal Achievement: 11/15/18 Potential to Achieve Goals: Good Progress towards PT goals: Progressing toward goals    Frequency    Min 5X/week      PT Plan Current plan remains appropriate    Co-evaluation              AM-PAC  PT "6 Clicks" Mobility   Outcome Measure  Help needed turning from your back to your side while in a flat bed without using bedrails?: A Little Help needed moving from lying on your back to sitting on the side of a flat bed without using bedrails?: A Little Help needed moving to and from a bed to a chair (including a wheelchair)?: A Little Help needed standing up from a chair using your arms (e.g., wheelchair or bedside chair)?: A Little Help needed to walk in hospital room?: A Little Help needed climbing 3-5 steps with a railing? : A Little 6 Click Score: 18    End of Session   Activity Tolerance: Patient tolerated treatment well Patient left: in bed;with call bell/phone within reach;with nursing/sitter in room(sitting EOB) Nurse Communication: Mobility status PT Visit Diagnosis: Unsteadiness on feet (R26.81);Muscle weakness (generalized) (M62.81);Difficulty in walking, not elsewhere classified (R26.2);Pain;Other (comment) Pain - Right/Left: Left Pain - part of body: Hip     Time: 5621-3086 PT Time Calculation (min) (ACUTE ONLY): 14 min  Charges:  $Therapeutic Activity: 8-22 mins                      Lanney Gins, PT, DPT Supplemental Physical Therapist 11/05/18 8:50 AM Pager: [phone] Office: [phone]

## 2018-11-05 NOTE — Progress Notes (Signed)
Patient ID: [name], male   DOB: [birth year], 68 y.o.   MRN: [id number] Doing well overall.  Incisional Provena wound vac placed yesterday to help dry up the incision from bloody drainage.  Feels well.  Mobilizing well.  Can go home today.

## 2018-11-05 NOTE — Discharge Instructions (Signed)
Increase activities as comfort allows. Continue your knee immobilizer. Full weight as tolerated.

## 2018-11-08 ENCOUNTER — Telehealth: Payer: Self-pay | Admitting: Orthopaedic Surgery

## 2018-11-08 NOTE — Telephone Encounter (Signed)
Yes.  He can shower.

## 2018-11-08 NOTE — Telephone Encounter (Signed)
Please advise 

## 2018-11-08 NOTE — Telephone Encounter (Signed)
Patient called in wanting to know if he can shower with his wound vac. Please advise

## 2018-11-08 NOTE — Telephone Encounter (Signed)
Patient aware.

## 2018-11-09 ENCOUNTER — Encounter: Payer: Self-pay | Admitting: Internal Medicine

## 2018-11-16 ENCOUNTER — Ambulatory Visit (INDEPENDENT_AMBULATORY_CARE_PROVIDER_SITE_OTHER): Payer: Medicare Other | Admitting: Internal Medicine

## 2018-11-16 ENCOUNTER — Other Ambulatory Visit: Payer: Self-pay

## 2018-11-16 ENCOUNTER — Other Ambulatory Visit (INDEPENDENT_AMBULATORY_CARE_PROVIDER_SITE_OTHER): Payer: Medicare Other

## 2018-11-16 ENCOUNTER — Encounter: Payer: Self-pay | Admitting: Internal Medicine

## 2018-11-16 VITALS — BP 110/58 | HR 109 | Temp 98.4°F | Resp 16 | Ht 69.0 in | Wt 195.5 lb

## 2018-11-16 DIAGNOSIS — R195 Other fecal abnormalities: Secondary | ICD-10-CM

## 2018-11-16 DIAGNOSIS — E1142 Type 2 diabetes mellitus with diabetic polyneuropathy: Secondary | ICD-10-CM

## 2018-11-16 DIAGNOSIS — R600 Localized edema: Secondary | ICD-10-CM | POA: Diagnosis not present

## 2018-11-16 DIAGNOSIS — R0789 Other chest pain: Secondary | ICD-10-CM

## 2018-11-16 DIAGNOSIS — D539 Nutritional anemia, unspecified: Secondary | ICD-10-CM

## 2018-11-16 DIAGNOSIS — D508 Other iron deficiency anemias: Secondary | ICD-10-CM | POA: Diagnosis not present

## 2018-11-16 DIAGNOSIS — E5111 Dry beriberi: Secondary | ICD-10-CM

## 2018-11-16 DIAGNOSIS — R7989 Other specified abnormal findings of blood chemistry: Secondary | ICD-10-CM | POA: Diagnosis not present

## 2018-11-16 LAB — URINALYSIS, ROUTINE W REFLEX MICROSCOPIC
Hgb urine dipstick: NEGATIVE
Ketones, ur: NEGATIVE
Leukocytes,Ua: NEGATIVE
Nitrite: NEGATIVE
RBC / HPF: NONE SEEN (ref 0–?)
Specific Gravity, Urine: >=1.03 — AB (ref 1.000–1.030)
Urine Glucose: NEGATIVE
Urobilinogen, UA: 1 (ref 0.0–1.0)
pH: 6 (ref 5.0–8.0)

## 2018-11-16 LAB — CBC WITH DIFFERENTIAL/PLATELET
Basophils Absolute: 0 10*3/uL (ref 0.0–0.1)
Basophils Relative: 0.4 % (ref 0.0–3.0)
Eosinophils Absolute: 0 10*3/uL (ref 0.0–0.7)
Eosinophils Relative: 0.4 % (ref 0.0–5.0)
HCT: 30.3 % — ABNORMAL LOW (ref 39.0–52.0)
Hemoglobin: 9.5 g/dL — ABNORMAL LOW (ref 13.0–17.0)
Lymphocytes Relative: 64.7 % — ABNORMAL HIGH (ref 12.0–46.0)
Lymphs Abs: 4.5 10*3/uL — ABNORMAL HIGH (ref 0.7–4.0)
MCHC: 31.2 g/dL (ref 30.0–36.0)
MCV: 94.1 fl (ref 78.0–100.0)
Monocytes Absolute: 0.2 10*3/uL (ref 0.1–1.0)
Monocytes Relative: 2.4 % — ABNORMAL LOW (ref 3.0–12.0)
Neutro Abs: 2.2 10*3/uL (ref 1.4–7.7)
Neutrophils Relative %: 32.1 % — ABNORMAL LOW (ref 43.0–77.0)
Platelets: 137 10*3/uL — ABNORMAL LOW (ref 150.0–400.0)
RBC: 3.22 Mil/uL — ABNORMAL LOW (ref 4.22–5.81)
RDW: 16.8 % — ABNORMAL HIGH (ref 11.5–15.5)
WBC: 6.9 10*3/uL (ref 4.0–10.5)

## 2018-11-16 LAB — MICROALBUMIN / CREATININE URINE RATIO
Creatinine,U: 211.4 mg/dL
Microalb Creat Ratio: 1.2 mg/g (ref 0.0–30.0)
Microalb, Ur: 2.6 mg/dL — ABNORMAL HIGH (ref 0.0–1.9)

## 2018-11-16 LAB — HEMOGLOBIN A1C: Hgb A1c MFr Bld: 5.1 % (ref 4.6–6.5)

## 2018-11-16 LAB — VITAMIN B12: Vitamin B-12: 481 pg/mL (ref 211–911)

## 2018-11-16 LAB — D-DIMER, QUANTITATIVE: D-Dimer, Quant: 4.02 mcg/mL FEU — ABNORMAL HIGH (ref <0.50)

## 2018-11-16 LAB — IBC PANEL
Iron: 31 ug/dL — ABNORMAL LOW (ref 42–165)
Saturation Ratios: 11 % — ABNORMAL LOW (ref 20.0–50.0)
Transferrin: 202 mg/dL — ABNORMAL LOW (ref 212.0–360.0)

## 2018-11-16 LAB — HEPATIC FUNCTION PANEL
ALT: 11 U/L (ref 0–53)
AST: 15 U/L (ref 0–37)
Albumin: 3 g/dL — ABNORMAL LOW (ref 3.5–5.2)
Alkaline Phosphatase: 115 U/L (ref 39–117)
Bilirubin, Direct: 0.2 mg/dL (ref 0.0–0.3)
Total Bilirubin: 0.6 mg/dL (ref 0.2–1.2)
Total Protein: 5.2 g/dL — ABNORMAL LOW (ref 6.0–8.3)

## 2018-11-16 LAB — RETICULOCYTES
ABS Retic: 55080 cells/uL (ref 25000–9000)
Retic Ct Pct: 1.7 %

## 2018-11-16 LAB — TSH: TSH: 1.29 u[IU]/mL (ref 0.35–4.50)

## 2018-11-16 LAB — BRAIN NATRIURETIC PEPTIDE: Pro B Natriuretic peptide (BNP): 163 pg/mL — ABNORMAL HIGH (ref 0.0–100.0)

## 2018-11-16 LAB — FERRITIN: Ferritin: 227.4 ng/mL (ref 22.0–322.0)

## 2018-11-16 LAB — FOLATE: Folate: >23.9 ng/mL (ref >5.9)

## 2018-11-16 LAB — TROPONIN I: TNIDX: 0.03 ug/l (ref 0.00–0.06)

## 2018-11-16 NOTE — Patient Instructions (Signed)
Anemia  Anemia is a condition in which you do not have enough red blood cells or hemoglobin. Hemoglobin is a substance in red blood cells that carries oxygen. When you do not have enough red blood cells or hemoglobin (are anemic), your body cannot get enough oxygen and your organs may not work properly. As a result, you may feel very tired or have other problems. What are the causes? Common causes of anemia include:  Excessive bleeding. Anemia can be caused by excessive bleeding inside or outside the body, including bleeding from the intestine or from periods in women.  Poor nutrition.  Long-lasting (chronic) kidney, thyroid, and liver disease.  Bone marrow disorders.  Cancer and treatments for cancer.  HIV (human immunodeficiency virus) and AIDS (acquired immunodeficiency syndrome).  Treatments for HIV and AIDS.  Spleen problems.  Blood disorders.  Infections, medicines, and autoimmune disorders that destroy red blood cells. What are the signs or symptoms? Symptoms of this condition include:  Minor weakness.  Dizziness.  Headache.  Feeling heartbeats that are irregular or faster than normal (palpitations).  Shortness of breath, especially with exercise.  Paleness.  Cold sensitivity.  Indigestion.  Nausea.  Difficulty sleeping.  Difficulty concentrating. Symptoms may occur suddenly or develop slowly. If your anemia is mild, you may not have symptoms. How is this diagnosed? This condition is diagnosed based on:  Blood tests.  Your medical history.  A physical exam.  Bone marrow biopsy. Your health care provider may also check your stool (feces) for blood and may do additional testing to look for the cause of your bleeding. You may also have other tests, including:  Imaging tests, such as a CT scan or MRI.  Endoscopy.  Colonoscopy. How is this treated? Treatment for this condition depends on the cause. If you continue to lose a lot of blood, you may  need to be treated at a hospital. Treatment may include:  Taking supplements of iron, vitamin S31, or folic acid.  Taking a hormone medicine (erythropoietin) that can help to stimulate red blood cell growth.  Having a blood transfusion. This may be needed if you lose a lot of blood.  Making changes to your diet.  Having surgery to remove your spleen. Follow these instructions at home:  Take over-the-counter and prescription medicines only as told by your health care provider.  Take supplements only as told by your health care provider.  Follow any diet instructions that you were given.  Keep all follow-up visits as told by your health care provider. This is important. Contact a health care provider if:  You develop new bleeding anywhere in the body. Get help right away if:  You are very weak.  You are short of breath.  You have pain in your abdomen or chest.  You are dizzy or feel faint.  You have trouble concentrating.  You have bloody or black, tarry stools.  You vomit repeatedly or you vomit up blood. Summary  Anemia is a condition in which you do not have enough red blood cells or enough of a substance in your red blood cells that carries oxygen (hemoglobin).  Symptoms may occur suddenly or develop slowly.  If your anemia is mild, you may not have symptoms.  This condition is diagnosed with blood tests as well as a medical history and physical exam. Other tests may be needed.  Treatment for this condition depends on the cause of the anemia. This information is not intended to replace advice given to you by  your health care provider. Make sure you discuss any questions you have with your health care provider. Document Released: 06/05/2004 Document Revised: 04/10/2017 Document Reviewed: 05/30/2016 Elsevier Patient Education  2020 [employer].

## 2018-11-16 NOTE — Progress Notes (Signed)
Subjective:  Patient ID: [name], male    DOB: [birth year]  Age: 68 y.o. MRN: [id number]  CC: Anemia   HPI [name] presents for f/up - He complains of a 2-week history of worsening lower extremity edema, chest pain that he describes as a dull ache under his sternum, weight gain, and shortness of breath.  Outpatient Medications Prior to Visit  Medication Sig Dispense Refill  . acetaminophen (TYLENOL) 500 MG tablet Take 500-1,000 mg by mouth every 6 (six) hours as needed for mild pain or moderate pain.     [name] atorvastatin (LIPITOR) 10 MG tablet Take 1 tablet (10 mg total) by mouth at bedtime. 30 tablet 11  . Brexpiprazole (REXULTI) 0.25 MG TABS Take 1 tablet by mouth daily. 90 tablet 1  . EPINEPHrine (EPIPEN 2-PAK) 0.3 mg/0.3 mL IJ SOAJ injection Inject 0.3 mg into the muscle as needed for anaphylaxis.    [name] escitalopram (LEXAPRO) 20 MG tablet Take 1 tablet (20 mg total) by mouth daily. 90 tablet 1  . famotidine (PEPCID) 40 MG tablet Take 1 tablet (40 mg total) by mouth 2 (two) times daily. 518 tablet 1  . folic acid (FOLVITE) 1 MG tablet Take 1 mg by mouth daily.  11  . GENVOYA 150-150-200-10 MG TABS tablet TAKE 1 TABLET BY MOUTH DAILY WITH BREAKFAST. 30 tablet 2  . HYDROcodone-acetaminophen (NORCO) 7.5-325 MG tablet Take 1-2 tablets by mouth every 6 (six) hours as needed for severe pain (pain score 7-10). 40 tablet 0  . leflunomide (ARAVA) 20 MG tablet Take 20 mg by mouth daily.     . metoprolol succinate (TOPROL-XL) 25 MG 24 hr tablet Take 25 mg by mouth daily.    . nitroGLYCERIN (NITROSTAT) 0.4 MG SL tablet Place 1 tablet (0.4 mg total) under the tongue every 5 (five) minutes as needed for chest pain. 25 tablet 2  . ondansetron (ZOFRAN) 4 MG tablet Take 4 mg by mouth every 6 (six) hours as needed for nausea.    . predniSONE (DELTASONE) 5 MG tablet Take 5 mg by mouth 2 (two) times daily.   1  . pregabalin (LYRICA) 200 MG capsule TAKE 1 CAPSULE BY MOUTH TWICE A DAY (Patient  taking differently: Take 200 mg by mouth 2 (two) times daily. ) 180 capsule 1  . PREZISTA 800 MG tablet TAKE 1 TABLET BY MOUTH DAILY. 30 tablet 2  . SAVAYSA 30 MG TABS tablet TAKE 1 TABLET (30 MG TOTAL) BY MOUTH DAILY. 30 tablet 5  . sulfamethoxazole-trimethoprim (BACTRIM DS,SEPTRA DS) 800-160 MG tablet TAKE 1 TABLET BY MOUTH EVERY 12 (TWELVE) HOURS. 60 tablet 3  . valACYclovir (VALTREX) 500 MG tablet Take 1 tablet (500 mg total) by mouth daily. 90 tablet 3  . doxycycline (VIBRA-TABS) 100 MG tablet Take 1 tablet (100 mg total) by mouth 2 (two) times daily. 30 tablet 0  . furosemide (LASIX) 20 MG tablet Take 1 tablet (20 mg total) by mouth 2 (two) times daily. 90 tablet 3  . 0.9 %  sodium chloride infusion     . acetaminophen (TYLENOL) tablet 325-650 mg     . diphenhydrAMINE (BENADRYL) 12.5 MG/5ML elixir 12.5-25 mg     . docusate sodium (COLACE) capsule 100 mg     . HYDROcodone-acetaminophen (NORCO) 7.5-325 MG per tablet 1-2 tablet     . HYDROcodone-acetaminophen (NORCO/VICODIN) 5-325 MG per tablet 1-2 tablet     . magnesium citrate solution 1 Bottle     . methocarbamol (ROBAXIN) 500  mg in dextrose 5 % 50 mL IVPB     . methocarbamol (ROBAXIN) tablet 500 mg     . metoCLOPramide (REGLAN) injection 5-10 mg     . metoCLOPramide (REGLAN) tablet 5-10 mg     . morphine 2 MG/ML injection 0.5-1 mg     . ondansetron (ZOFRAN) injection 4 mg     . ondansetron (ZOFRAN) tablet 4 mg     . polyethylene glycol (MIRALAX / GLYCOLAX) packet 17 g     . sorbitol 70 % solution 30 mL      No facility-administered medications prior to visit.     ROS Review of Systems  Constitutional: Positive for appetite change (poor appetite), fatigue and unexpected weight change. Negative for chills and diaphoresis.  HENT: Negative.  Negative for trouble swallowing.   Respiratory: Positive for shortness of breath. Negative for cough, chest tightness and wheezing.   Cardiovascular: Positive for chest pain and leg swelling.  Negative for palpitations.  Gastrointestinal: Negative for abdominal pain, blood in stool, constipation, diarrhea, nausea and vomiting.  Endocrine: Negative.   Genitourinary: Negative.  Negative for difficulty urinating and dysuria.  Musculoskeletal: Positive for arthralgias. Negative for back pain and myalgias.  Skin: Negative.  Negative for color change, pallor and rash.  Allergic/Immunologic: Negative.   Neurological: Negative.  Negative for dizziness, weakness, light-headedness and numbness.  Hematological: Negative for adenopathy. Does not bruise/bleed easily.  Psychiatric/Behavioral: Negative.     Objective:  BP (!) 110/58 (BP Location: Left Arm, Patient Position: Sitting, Cuff Size: Normal) Comment: BP (R) 106/62 (L) 110/58  Pulse (!) 109   Temp 98.4 F (36.9 C) (Oral)   Resp 16   Ht 5\' 9"  (1.753 m)   Wt 195 lb 8 oz (88.7 kg)   SpO2 97%   BMI 28.87 kg/m   BP Readings from Last 3 Encounters:  11/16/18 (!) 110/58  11/05/18 (!) 142/76  09/26/18 108/73    Wt Readings from Last 3 Encounters:  11/16/18 195 lb 8 oz (88.7 kg)  10/31/18 171 lb 15.3 oz (78 kg)  09/26/18 172 lb (78 kg)    Physical Exam Constitutional:      General: He is not in acute distress.    Appearance: He is ill-appearing. He is not toxic-appearing or diaphoretic.  HENT:     Nose: Nose normal.     Mouth/Throat:     Mouth: Mucous membranes are moist. Mucous membranes are pale.  Eyes:     General: No scleral icterus.    Conjunctiva/sclera: Conjunctivae normal.  Neck:     Musculoskeletal: Normal range of motion. No neck rigidity or muscular tenderness.  Cardiovascular:     Rate and Rhythm: Regular rhythm. Tachycardia present.  No extrasystoles are present.    Chest Wall: PMI is not displaced.     Heart sounds: Normal heart sounds, S1 normal and S2 normal. No murmur. No gallop.      Comments: EKG ---  Sinus  Tachycardia  -Short PR syndrome  PRi = 116 BORDERLINE RHYTHM  Pulmonary:     Breath  sounds: No stridor. No wheezing, rhonchi or rales.  Abdominal:     General: Abdomen is flat. There is no distension.     Palpations: There is no hepatomegaly, splenomegaly or mass.     Tenderness: There is no abdominal tenderness.  Musculoskeletal:     Right lower leg: 2+ Pitting Edema present.     Left lower leg: 2+ Pitting Edema present.  Lymphadenopathy:  Cervical: No cervical adenopathy.  Skin:    General: Skin is warm.     Coloration: Skin is pale.     Findings: No rash.  Neurological:     General: No focal deficit present.     Mental Status: He is alert.  Psychiatric:        Mood and Affect: Mood normal.        Behavior: Behavior normal.     Lab Results  Component Value Date   WBC 6.9 11/16/2018   HGB 9.5 (L) 11/16/2018   HCT 30.3 (L) 11/16/2018   PLT 137.0 (L) 11/16/2018   GLUCOSE 131 (H) 11/03/2018   CHOL 162 10/28/2018   TRIG 152 (H) 10/28/2018   HDL 35 (L) 10/28/2018   LDLCALC 102 (H) 10/28/2018   ALT 11 11/16/2018   AST 15 11/16/2018   NA 140 11/03/2018   K 3.4 (L) 11/03/2018   CL 107 11/03/2018   CREATININE 0.80 11/03/2018   BUN 10 11/03/2018   CO2 25 11/03/2018   TSH 1.29 11/16/2018   INR 1.61 11/19/2017   HGBA1C 5.1 11/16/2018   MICROALBUR 2.6 (H) 11/16/2018    Dg C-arm 1-60 Min  Result Date: 10/31/2018 CLINICAL DATA:  Left hip reduction. EXAM: DG C-ARM 61-120 MIN; OPERATIVE LEFT HIP WITH PELVIS COMPARISON:  Pre reduction radiographs earlier this day. FINDINGS: Three fluoroscopic spot views obtained in the operating room demonstrate interval relocation of femoral component of left hip arthroplasty. No visualized periprosthetic fracture on these fluoroscopic views. Fluoroscopy time 8 seconds. IMPRESSION: Intraoperative fluoroscopy during reduction of left hip arthroplasty dislocation. Electronically Signed   By: Keith Rake M.D.   On: 10/31/2018 22:29   Dg Hip Port Unilat With Pelvis 1v Left  Result Date: 10/31/2018 CLINICAL DATA:  Left hip  reduction. EXAM: DG HIP (WITH OR WITHOUT PELVIS) 1V PORT LEFT COMPARISON:  Radiograph earlier this day at 1327 hour FINDINGS: Image number 1 and 2 demonstrates persistent dislocation of femoral component of left hip arthroplasty. Cross-table lateral demonstrates the femoral component projecting over the acetabular cup, however this may be due to superimposition. IMPRESSION: Persistent dislocation of femoral component of left hip arthroplasty. Electronically Signed   By: Keith Rake M.D.   On: 10/31/2018 22:32   Dg Hip Operative Unilat W Or W/o Pelvis Left  Result Date: 10/31/2018 CLINICAL DATA:  Left hip reduction. EXAM: DG C-ARM 61-120 MIN; OPERATIVE LEFT HIP WITH PELVIS COMPARISON:  Pre reduction radiographs earlier this day. FINDINGS: Three fluoroscopic spot views obtained in the operating room demonstrate interval relocation of femoral component of left hip arthroplasty. No visualized periprosthetic fracture on these fluoroscopic views. Fluoroscopy time 8 seconds. IMPRESSION: Intraoperative fluoroscopy during reduction of left hip arthroplasty dislocation. Electronically Signed   By: Keith Rake M.D.   On: 10/31/2018 22:29    Assessment & Plan:   [place] was seen today for anemia.  Diagnoses and all orders for this visit:  Thiamine deficiency neuropathy -     Cancel: CBC with Differential/Platelet; Future  Deficiency anemia- He has moderately severe but symptomatic iron deficiency anemia.  I recommend that he receive iron infusions. -     Cancel: CBC with Differential/Platelet; Future -     IBC panel; Future -     Vitamin B12; Future -     Ferritin; Future -     Folate; Future -     Reticulocytes; Future -     CBC with Differential/Platelet; Future  Type 2 diabetes mellitus with diabetic polyneuropathy,  without long-term current use of insulin ([place])- His blood sugar is adequately well controlled. -     Hepatic function panel; Future -     Hemoglobin A1c; Future -      Urinalysis, Routine w reflex microscopic; Future -     Microalbumin / creatinine urine ratio; Future  Bilateral leg edema- He has an elevated d-dimer, low protein, and low albumin.  I think the lower extremity edema is multifactorial.  I think his blood pressure is too soft for him to tolerate a loop diuretic at this time so have asked him to stop taking it.  I recommended that he increase his intake of proteins and to elevate his lower extremities and to obtain compression stockings.  I will also screen him for DVT though he is already anticoagulated with a  DOAC. -     Brain natriuretic peptide; Future -     Urinalysis, Routine w reflex microscopic; Future -     D-dimer, quantitative (not at [facility]); Future -     Troponin I -; Future -     TSH; Future -     EKG 12-Lead -     VAS [place] LOWER EXTREMITY VENOUS (DVT); Future  Iron deficiency anemia due to dietary causes  D-dimer, elevated- He will undergo screening for PE and DVT. -     VAS [place] LOWER EXTREMITY VENOUS (DVT); Future -     CT Angio Chest W/Cm &/Or Wo Cm; Future  Other chest pain -     CT Angio Chest W/Cm &/Or Wo Cm; Future   I have discontinued Muaz L. Wysong "Phil"'s furosemide and doxycycline. I am also having him maintain his predniSONE, folic acid, nitroGLYCERIN, valACYclovir, atorvastatin, leflunomide, pregabalin, Savaysa, sulfamethoxazole-trimethoprim, acetaminophen, EPINEPHrine, ondansetron, Prezista, Genvoya, famotidine, Rexulti, escitalopram, metoprolol succinate, and HYDROcodone-acetaminophen. We will stop administering sodium chloride, methocarbamol, methocarbamol, diphenhydrAMINE, docusate sodium, polyethylene glycol, sorbitol, magnesium citrate, ondansetron, ondansetron, metoCLOPramide, metoCLOPramide, acetaminophen, HYDROcodone-acetaminophen, HYDROcodone-acetaminophen, and morphine.  No orders of the defined types were placed in this encounter.    Follow-up: Return in about 1 week (around 11/23/2018).  [name],  MD

## 2018-11-16 NOTE — Telephone Encounter (Signed)
I recommend GI pathogen panel to exclude infection; it is noted that he was recently in the hospital for a surgery and I want to ensure he is not developed an enteric/colonic infection After this I can recommend treatment

## 2018-11-17 ENCOUNTER — Telehealth: Payer: Self-pay | Admitting: *Deleted

## 2018-11-17 ENCOUNTER — Ambulatory Visit (HOSPITAL_COMMUNITY)
Admission: RE | Admit: 2018-11-17 | Discharge: 2018-11-17 | Disposition: A | Discharge status: Home / Self Care | Payer: Medicare Other | Source: Ambulatory Visit | Attending: Internal Medicine | Admitting: Internal Medicine

## 2018-11-17 ENCOUNTER — Encounter: Payer: Self-pay | Admitting: Internal Medicine

## 2018-11-17 ENCOUNTER — Telehealth (HOSPITAL_COMMUNITY): Payer: Self-pay | Admitting: Rehabilitation

## 2018-11-17 ENCOUNTER — Telehealth: Payer: Self-pay | Admitting: Infectious Diseases

## 2018-11-17 DIAGNOSIS — D508 Other iron deficiency anemias: Secondary | ICD-10-CM | POA: Insufficient documentation

## 2018-11-17 DIAGNOSIS — R0789 Other chest pain: Secondary | ICD-10-CM | POA: Insufficient documentation

## 2018-11-17 DIAGNOSIS — R7989 Other specified abnormal findings of blood chemistry: Secondary | ICD-10-CM | POA: Insufficient documentation

## 2018-11-17 DIAGNOSIS — R600 Localized edema: Secondary | ICD-10-CM | POA: Insufficient documentation

## 2018-11-17 MED ORDER — IOHEXOL 350 MG/ML SOLN
75.0000 mL | Freq: Once | INTRAVENOUS | Status: AC | PRN
  Administered 2018-11-17: 75 mL via INTRAVENOUS

## 2018-11-17 NOTE — Telephone Encounter (Signed)
COVID-19 Pre-Screening Questions:11/17/18  Do you currently have a fever (>100 F), chills or unexplained body aches? NO   Are you currently experiencing new cough, shortness of breath, sore throat, runny nose? NO   Have you recently travelled outside the state of [place] in the last 14 days? NO    Have you been in contact with someone that is currently pending confirmation of Covid19 testing or has been confirmed to have the [place] virus?  NO   **If the patient answers NO to ALL questions -  advise the patient to please call the clinic before coming to the office should any symptoms develop.

## 2018-11-17 NOTE — Telephone Encounter (Signed)
Stat results:[name]  Preliminary: can not rule out partial thrombosis- report in chart- CV procedure. Patient is aware and awaiting call with instruction from PCP- sent home.

## 2018-11-17 NOTE — Telephone Encounter (Signed)

## 2018-11-18 ENCOUNTER — Encounter: Payer: Self-pay | Admitting: Internal Medicine

## 2018-11-18 ENCOUNTER — Encounter: Payer: Self-pay | Admitting: Infectious Diseases

## 2018-11-18 ENCOUNTER — Encounter: Payer: Self-pay | Admitting: Orthopaedic Surgery

## 2018-11-18 ENCOUNTER — Ambulatory Visit (INDEPENDENT_AMBULATORY_CARE_PROVIDER_SITE_OTHER): Payer: Medicare Other | Admitting: Infectious Diseases

## 2018-11-18 ENCOUNTER — Ambulatory Visit (INDEPENDENT_AMBULATORY_CARE_PROVIDER_SITE_OTHER): Payer: Medicare Other | Admitting: Orthopaedic Surgery

## 2018-11-18 ENCOUNTER — Other Ambulatory Visit: Payer: Self-pay

## 2018-11-18 VITALS — BP 94/62 | HR 89 | Temp 98.2°F | Wt 197.0 lb

## 2018-11-18 DIAGNOSIS — T84020D Dislocation of internal right hip prosthesis, subsequent encounter: Secondary | ICD-10-CM | POA: Diagnosis not present

## 2018-11-18 DIAGNOSIS — Z113 Encounter for screening for infections with a predominantly sexual mode of transmission: Secondary | ICD-10-CM

## 2018-11-18 DIAGNOSIS — R600 Localized edema: Secondary | ICD-10-CM | POA: Diagnosis not present

## 2018-11-18 DIAGNOSIS — Z79899 Other long term (current) drug therapy: Secondary | ICD-10-CM | POA: Diagnosis not present

## 2018-11-18 DIAGNOSIS — R609 Edema, unspecified: Secondary | ICD-10-CM

## 2018-11-18 DIAGNOSIS — Z21 Asymptomatic human immunodeficiency virus [HIV] infection status: Secondary | ICD-10-CM | POA: Diagnosis not present

## 2018-11-18 DIAGNOSIS — Z96642 Presence of left artificial hip joint: Secondary | ICD-10-CM

## 2018-11-18 DIAGNOSIS — Z8739 Personal history of other diseases of the musculoskeletal system and connective tissue: Secondary | ICD-10-CM

## 2018-11-18 DIAGNOSIS — Z87828 Personal history of other (healed) physical injury and trauma: Secondary | ICD-10-CM

## 2018-11-18 NOTE — Progress Notes (Signed)
   Subjective:    Patient ID: [name], male    DOB: 06-Sep-1950, 68 y.o.   MRN: [id number]  HPI 68yo M with hx of HIV+, CLL(dx 2007), pulmonary fibrosis, and RA for which he takes MTX. He also has a hx of esophageal strictures dilated by EGD every 1.5 yrs.  2-202-18 he underwent lumbar laminectomy.No problems. "Dr Saintclair Halsted did an outstanding job. I praise the ground he walks on". Sees Onc yearly- told that he is doing well, (Stage 0) continue surveillance.  He was seen by Rheum-  leflunamide. He is currently on genvoya/darunavir.He has had no problems with his ART.  He had dislocation of his L THR (done 09-17-2018). He had revision of this 11-02-2018. States he has had 4 hip surgeries in 6 weeks.   He feels fine today. In clinic his BP is low but he denies dizziness or lightheadedness.  He has also developed B LE edema, been using compression stockings with some relief.  Continues to be depressed.   HIV 1 RNA Quant (copies/mL)  Date Value  10/28/2018 <20 NOT DETECTED  01/07/2018 <20 DETECTED (A)  07/09/2017 <20 NOT DETECTED   CD4 T Cell Abs (/uL)  Date Value  10/28/2018 681  01/07/2018 1,220  07/09/2017 1,790   He saw PCP yesterday and had CT chest- 1. No acute intrathoracic pathology. No definite pulmonary artery embolus identified. 2. Small left pleural effusion with associated partial compressive atelectasis of the left lung base. 3. A 11 x 19 mm peripheral subpleural density at the left lung base, likely an area of atelectasis/scarring. Attention on follow-up imaging recommended. 4. Coronary vascular calcification and CABG.  Review of Systems  Constitutional: Positive for unexpected weight change. Negative for appetite change, chills and fever.  Respiratory: Negative for cough and shortness of breath.   Cardiovascular: Positive for leg swelling.  Gastrointestinal: Negative for constipation and diarrhea.  Genitourinary: Positive for decreased urine volume.   Musculoskeletal: Negative for back pain.  stool is 5-7 day, pellets with slime. He has let Dr Hilarie Fredrickson know. Passes gas and a pellet also passes.  No sick exposures.  Please see HPI. All other systems reviewed and negative.     Objective:   Physical Exam Constitutional:      Appearance: Normal appearance.  HENT:     Mouth/Throat:     Mouth: Mucous membranes are moist.     Pharynx: No oropharyngeal exudate.  Eyes:     Extraocular Movements: Extraocular movements intact.     Pupils: Pupils are equal, round, and reactive to light.  Neck:     Musculoskeletal: Normal range of motion and neck supple.  Cardiovascular:     Rate and Rhythm: Rhythm irregular.  Pulmonary:     Effort: Pulmonary effort is normal.     Breath sounds: Normal breath sounds.  Abdominal:     General: Bowel sounds are normal.     Palpations: Abdomen is soft.  Musculoskeletal:     Right lower leg: Edema present.     Left lower leg: Edema present.  Neurological:     General: No focal deficit present.     Mental Status: He is alert.  Psychiatric:        Mood and Affect: Mood normal.       Assessment & Plan:

## 2018-11-18 NOTE — Assessment & Plan Note (Signed)
CT chest negative for PE.

## 2018-11-18 NOTE — Assessment & Plan Note (Signed)
The etiology of this is unlcear.  He does not have a hx of afib but I did hear a pause on listening to his hear.  I asked him to call his PCP (today) about his edema and be seen sooner than next week.

## 2018-11-18 NOTE — Assessment & Plan Note (Signed)
He is doing well His vax are up to date (PCV and Verdi).  He asks about flu vax which we won't have til next month.  Will see him back in 9 months.

## 2018-11-18 NOTE — Assessment & Plan Note (Signed)
His wound is clean and he is pain free.  He continues to have a VAC which also looks good.  He has f/u with Dr Ninfa Linden today.

## 2018-11-18 NOTE — Progress Notes (Signed)
The patient continues to follow-up status post extensive surgery involving his left hip.  We had replaced his hip and then he immediately dislocated.  He has a severe kyphotic deformity of his back and it took [place] a while to really find the right position to get his acetabular component in.  He had since dislocated again and we finally tried a constrained liner 2 weeks ago.  He is doing well overall except for peripheral edema in his feet which his primary care physician is concerned about.  He had bilateral lower extremity Dopplers which were negative for DVT.  He is on blood thinning medication which is for chronic A. fib.  He is HIV positive but has good control with this as well.  On examination today the incision is dry.  The suture lines intact.  We have had incisional VAC on for 2 weeks it is had no drainage.  This is actually less to have seen his incision.  There is no hematoma or seroma today.  I am hesitant to remove the sutures today.  I did place Steri-Strips actually on top of the sutures to protect the incision further.  I would like to see him next week for removal of the Steri-Strips and the sutures.  He will still adhere to strict posterior hip precautions.

## 2018-11-19 ENCOUNTER — Encounter: Payer: Self-pay | Admitting: Internal Medicine

## 2018-11-19 ENCOUNTER — Emergency Department (HOSPITAL_COMMUNITY)
Admission: EM | Admit: 2018-11-19 | Discharge: 2018-11-19 | Disposition: A | Discharge status: Home / Self Care | Payer: Medicare Other | Attending: Emergency Medicine | Admitting: Emergency Medicine

## 2018-11-19 ENCOUNTER — Emergency Department (HOSPITAL_COMMUNITY): Payer: Medicare Other

## 2018-11-19 ENCOUNTER — Other Ambulatory Visit: Payer: Self-pay

## 2018-11-19 ENCOUNTER — Encounter (HOSPITAL_COMMUNITY): Payer: Self-pay | Admitting: *Deleted

## 2018-11-19 DIAGNOSIS — R600 Localized edema: Secondary | ICD-10-CM | POA: Insufficient documentation

## 2018-11-19 DIAGNOSIS — E876 Hypokalemia: Secondary | ICD-10-CM | POA: Insufficient documentation

## 2018-11-19 DIAGNOSIS — R000 Tachycardia, unspecified: Secondary | ICD-10-CM | POA: Diagnosis not present

## 2018-11-19 DIAGNOSIS — Z79899 Other long term (current) drug therapy: Secondary | ICD-10-CM | POA: Diagnosis not present

## 2018-11-19 DIAGNOSIS — E1142 Type 2 diabetes mellitus with diabetic polyneuropathy: Secondary | ICD-10-CM | POA: Insufficient documentation

## 2018-11-19 DIAGNOSIS — I10 Essential (primary) hypertension: Secondary | ICD-10-CM | POA: Diagnosis not present

## 2018-11-19 DIAGNOSIS — B20 Human immunodeficiency virus [HIV] disease: Secondary | ICD-10-CM | POA: Insufficient documentation

## 2018-11-19 DIAGNOSIS — R609 Edema, unspecified: Secondary | ICD-10-CM

## 2018-11-19 DIAGNOSIS — R0602 Shortness of breath: Secondary | ICD-10-CM | POA: Diagnosis not present

## 2018-11-19 LAB — CBC
HCT: 35.4 % — ABNORMAL LOW (ref 39.0–52.0)
Hemoglobin: 10.4 g/dL — ABNORMAL LOW (ref 13.0–17.0)
MCH: 29.2 pg (ref 26.0–34.0)
MCHC: 29.4 g/dL — ABNORMAL LOW (ref 30.0–36.0)
MCV: 99.4 fL (ref 80.0–100.0)
Platelets: 163 10*3/uL (ref 150–400)
RBC: 3.56 MIL/uL — ABNORMAL LOW (ref 4.22–5.81)
RDW: 15.7 % — ABNORMAL HIGH (ref 11.5–15.5)
WBC: 9.1 10*3/uL (ref 4.0–10.5)
nRBC: 0 % (ref 0.0–0.2)

## 2018-11-19 LAB — BASIC METABOLIC PANEL
Anion gap: 10 (ref 5–15)
BUN: 8 mg/dL (ref 8–23)
CO2: 25 mmol/L (ref 22–32)
Calcium: 8.2 mg/dL — ABNORMAL LOW (ref 8.9–10.3)
Chloride: 108 mmol/L (ref 98–111)
Creatinine, Ser: 0.77 mg/dL (ref 0.61–1.24)
GFR calc Af Amer: >60 mL/min (ref >60)
GFR calc non Af Amer: >60 mL/min (ref >60)
Glucose, Bld: 110 mg/dL — ABNORMAL HIGH (ref 70–99)
Potassium: 3.2 mmol/L — ABNORMAL LOW (ref 3.5–5.1)
Sodium: 143 mmol/L (ref 135–145)

## 2018-11-19 LAB — MAGNESIUM: Magnesium: 1.9 mg/dL (ref 1.7–2.4)

## 2018-11-19 MED ORDER — POTASSIUM CHLORIDE CRYS ER 20 MEQ PO TBCR: 20.0000 meq | EXTENDED_RELEASE_TABLET | Freq: Every day | ORAL | 0 refills | Status: DC

## 2018-11-19 MED ORDER — POTASSIUM CHLORIDE CRYS ER 20 MEQ PO TBCR
40.0000 meq | EXTENDED_RELEASE_TABLET | Freq: Once | ORAL | Status: AC
  Administered 2018-11-19: 40 meq via ORAL
  Filled 2018-11-19: qty 2

## 2018-11-19 MED ORDER — FUROSEMIDE 20 MG PO TABS: 20.0000 mg | ORAL_TABLET | Freq: Every day | ORAL | 0 refills | Status: DC

## 2018-11-19 MED ORDER — FUROSEMIDE 10 MG/ML IJ SOLN
20.0000 mg | Freq: Once | INTRAMUSCULAR | Status: AC
  Administered 2018-11-19: 20 mg via INTRAVENOUS
  Filled 2018-11-19: qty 2

## 2018-11-19 MED ORDER — SODIUM CHLORIDE 0.9% FLUSH: 3.0000 mL | Freq: Once | INTRAVENOUS | Status: DC

## 2018-11-19 NOTE — Telephone Encounter (Addendum)
Called pt, lvm with Dr. Charlynne Cousins response below:   "It would appear Dr. Ronnald Ramp stopped his fluid pill due to low BP so this makes sense. Does not seem emergent. Keep elevating legs and using compression or tight stockings. Happy to do visit if he wants evaluation."

## 2018-11-19 NOTE — ED Triage Notes (Signed)
To ED for eval of BLE swelling for past few days and sob. States he was taken of his 'fluid pill' 2 days ago 'because it was too dangerous'. No cp. 2-3+ pitting edema in lower extremities - left greater than right. Speaks in clear full sentences. Skin w/d. Appears in nad.

## 2018-11-19 NOTE — ED Notes (Signed)
Pt given discharge instructions. Pt verbalizes understanding. Pt wheeled him self out with his assistive device.

## 2018-11-19 NOTE — ED Provider Notes (Signed)
[facility] EMERGENCY DEPARTMENT Provider Note   CSN: [id number] Arrival date & time: 11/19/18  1234    History   Chief Complaint Chief Complaint  Patient presents with  . Shortness of Breath  . Leg Swelling    HPI [name] is a 68 y.o. male.     Pt presents to the ED today with leg swelling.  The pt said he was told to stop his lasix because his blood pressure was too low.  Since then, his legs have been getting more swollen.  He has had a little sob.  He did see his doctor for this and had an [place] and CT chest.  [place] said they could not rule out a clot on the right side which is the less swollen side.  The pt's CT chest showed a small left pleural effusion.  The pt has also had some diarrhea.     Past Medical History:  Diagnosis Date  . Allergy   . Anxiety   . Carotid artery occlusion    40-60% right ICA stenosis (09/2008)  . Cataract   . Chronic back pain   . CLL (chronic lymphoblastic leukemia) dx 2010   Followed at [facility] q28mo, no current therapy   . Clotting disorder ([place])   . Coronary artery disease 2010   s/p CABG '10, sees Dr. Percival Spanish  . Depression   . Diverticulosis   . DVT, lower extremity, recurrent ([place]) 2008, 2009   LLE, chronic anticoag since 2009  . Esophagitis   . Fibromyalgia   . Gallstones   . GERD (gastroesophageal reflux disease)   . Gout   . Gynecomastia, male   . H/O hiatal hernia 2008   surgery  . Hemorrhoids   . Hepatitis A yrs ago  . HIV infection ([place]) dx 1993  . Hypertension   . Impotence of organic origin   . Myocardial infarction ([place]) 2010    x 2  . Neuromuscular disorder ([facility])    neuropathy  . Osteoarthritis, knee    s/p B TKA  . Osteoporosis   . Pneumonia mrach, may, july 2017  . Rheumatoid arthritis(714.0) dx 2010   MTX, follows with rheum  . Seasonal allergies   . Secondary syphilis 07/24/14 dx   s/p 2 wks doxy  . Status post dilation of esophageal narrowing   . Stroke ([place]) [place]   . TIA  (transient ischemic attack) 1997   mild residual L mouth droop  . Tubular adenoma of colon     Patient Active Problem List   Diagnosis Date Noted  . Edema 11/18/2018  . Iron deficiency anemia due to dietary causes 11/17/2018  . D-dimer, elevated 11/17/2018  . Other chest pain 11/17/2018  . Bilateral leg edema 11/16/2018  . Failure of right total hip arthroplasty with dislocation of hip ([place])   . Dislocation of hip prosthesis ([place])   . Pneumonia of both lungs due to infectious organism 08/24/2018  . Status post total replacement of left hip 08/13/2018  . Avascular necrosis of bone of hip, left ([place]) 08/09/2018  . Eczema 07/01/2018  . Candida rash of groin 07/01/2018  . Tinea corporis 01/22/2018  . Depression with anxiety 01/14/2018  . 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 Deficiency anemia 12/25/2016  . Carotid artery disease ([place]) 11/13/2016  . Stenosis of carotid artery 11/13/2016  . Spinal stenosis of thoracolumbar region 07/02/2016  . ILD (interstitial lung disease) ([place]) 09/13/2015  . Immunocompromised state ([place]) 09/13/2015  .  PCP (pneumocystis jiroveci pneumonia) ([place]) 06/18/2015  . Postinflammatory pulmonary fibrosis ([place]) 05/11/2015  . GERD (gastroesophageal reflux disease)   . Hereditary and idiopathic peripheral neuropathy 09/08/2013  . Erosive esophagitis 12/28/2012  . Allergic rhinitis, cause unspecified 04/28/2012  . Long term current use of anticoagulant therapy 02/03/2012  . Hypertension   . DVT, lower extremity, recurrent ([place])   . Gout   . Carotid artery occlusion   . Hyperlipidemia with target LDL less than 100   . HIV infection ([place]) 04/08/2011  . Arthritis, rheumatoid ([place]) 04/08/2011  . Chronic lymphoblastic leukemia 04/08/2011  . Type 2 diabetes mellitus with diabetic polyneuropathy, without long-term current use of insulin ([place]) 04/02/2011  . Impotence of organic origin 04/02/2011  . CAD (coronary artery disease) of artery bypass graft  02/14/2009  . Herpes genitalis 06/22/2008    Past Surgical History:  Procedure Laterality Date  . ANTERIOR HIP REVISION Left 08/17/2018   Procedure: ANTERIOR LEFT HIP REVISION;  Surgeon: Mcarthur Rossetti, MD;  Location: [facility];  Service: Orthopedics;  Laterality: Left;  . ANTERIOR HIP REVISION Left 11/02/2018   Procedure: LEFT HIP CONSTRAINED LINER REVISION;  Surgeon: Mcarthur Rossetti, MD;  Location: [facility];  Service: Orthopedics;  Laterality: Left;  . CHOLECYSTECTOMY    . COLONOSCOPY WITH PROPOFOL N/A 12/28/2012   Procedure: COLONOSCOPY WITH PROPOFOL;  Surgeon: Jerene Bears, MD;  Location: [facility];  Service: Gastroenterology;  Laterality: N/A;  . CORONARY ARTERY BYPASS GRAFT  2010   triple bypass  . ESOPHAGOGASTRODUODENOSCOPY (EGD) WITH PROPOFOL N/A 12/28/2012   Procedure: ESOPHAGOGASTRODUODENOSCOPY (EGD) WITH PROPOFOL;  Surgeon: Jerene Bears, MD;  [facility];  Service: Gastroenterology;  Laterality: N/A;  . ESOPHAGOGASTRODUODENOSCOPY (EGD) WITH PROPOFOL N/A 03/15/2013   Procedure: ESOPHAGOGASTRODUODENOSCOPY (EGD) WITH PROPOFOL;  Surgeon: Jerene Bears, MD;  [facility];  Service: Gastroenterology;  Laterality: N/A;  . ESOPHAGOGASTRODUODENOSCOPY (EGD) WITH PROPOFOL N/A 02/07/2016   Procedure: ESOPHAGOGASTRODUODENOSCOPY (EGD) WITH PROPOFOL;  Surgeon: Jerene Bears, MD;  [facility];  Service: Gastroenterology;  Laterality: N/A;  . HARDWARE REMOVAL N/A 07/02/2012   Procedure: HARDWARE REMOVAL;  Surgeon: Elaina Hoops, MD;  Location: [facility];  Service: Neurosurgery;  Laterality: N/A;  . HIATAL HERNIA REPAIR     wrap  . HIP CLOSED REDUCTION Left 08/15/2018   Procedure: CLOSED REDUCTION HIP;  Surgeon: Newt Minion, MD;  Location: [facility];  Service: Orthopedics;  Laterality: Left;  . HIP CLOSED REDUCTION Left 08/28/2018   Procedure: CLOSED REDUCTION HIP FOR RECURRENT DISLOCATION AND DRESSING CHANGE;  Surgeon: Jessy Oto, MD;  Location: [facility];   Service: Orthopedics;  Laterality: Left;  . HIP CLOSED REDUCTION Left 09/15/2018   Procedure: CLOSED REDUCTION HIP DISLOCATION;  Surgeon: Mcarthur Rossetti, MD;  Location: [facility];  Service: Orthopedics;  Laterality: Left;  . HIP CLOSED REDUCTION Left 10/31/2018   Procedure: CLOSED REDUCTION LEFT TOTAL HIP;  Surgeon: Leandrew Koyanagi, MD;  Location: [facility];  Service: Orthopedics;  Laterality: Left;  . INGUINAL HERNIA REPAIR Bilateral   . JOINT REPLACEMENT Left 1999  . KNEE ARTHROPLASTY  07/22/2011   Procedure: COMPUTER ASSISTED TOTAL KNEE ARTHROPLASTY;  Surgeon: Meredith Pel, MD;  Location: [facility];  Service: Orthopedics;  Laterality: Right;  Right total knee arthroplasty  . MANDIBLE SURGERY Bilateral    tmj  . REPLACEMENT TOTAL KNEE Bilateral   . ring around testicle hernia reapir  184 and 1986   x 2  . ROTATOR CUFF REPAIR Right   .  SHOULDER SURGERY Left   . SPINE SURGERY  2010   "rod and screws", "failed", lopwer spine,   . stent to heart x 1  2010  . TONSILLECTOMY    . TOTAL HIP ARTHROPLASTY Left 08/13/2018   Procedure: LEFT TOTAL HIP ARTHROPLASTY ANTERIOR APPROACH;  Surgeon: Mcarthur Rossetti, MD;  Location: [facility];  Service: Orthopedics;  Laterality: Left;  . TOTAL HIP ARTHROPLASTY Left 09/17/2018   Procedure: ANTERIOR LEFT HIP REVISION;  Surgeon: Mcarthur Rossetti, MD;  Location: [facility];  Service: Orthopedics;  Laterality: Left;  . UMBILICAL HERNIA REPAIR     x 1  . varicose vein     stripping  . VIDEO BRONCHOSCOPY Bilateral 10/16/2015   Procedure: VIDEO BRONCHOSCOPY WITHOUT FLUORO;  Surgeon: Brand Males, MD;  [facility];  Service: Cardiopulmonary;  Laterality: Bilateral;        Home Medications    Prior to Admission medications   Medication Sig Start Date End Date Taking? Authorizing Provider  acetaminophen (TYLENOL) 500 MG tablet Take 500-1,000 mg by mouth every 6 (six) hours as needed for mild pain or moderate pain.     [provider]   atorvastatin (LIPITOR) 10 MG tablet Take 1 tablet (10 mg total) by mouth at bedtime. 04/05/18   [name], MD  Brexpiprazole (REXULTI) 0.25 MG TABS Take 1 tablet by mouth daily. 10/26/18   [name], MD  EPINEPHrine (EPIPEN 2-PAK) 0.3 mg/0.3 mL IJ SOAJ injection Inject 0.3 mg into the muscle as needed for anaphylaxis.    [provider]  escitalopram (LEXAPRO) 20 MG tablet Take 1 tablet (20 mg total) by mouth daily. 10/26/18   [name], MD  famotidine (PEPCID) 40 MG tablet Take 1 tablet (40 mg total) by mouth 2 (two) times daily. 10/21/18   Pyrtle, Lajuan Lines, MD  folic acid (FOLVITE) 1 MG tablet Take 1 mg by mouth daily. 09/17/17   [provider]  furosemide (LASIX) 20 MG tablet Take 1 tablet (20 mg total) by mouth daily. 11/19/18   Isla Pence, MD  GENVOYA 150-150-200-10 MG TABS tablet TAKE 1 TABLET BY MOUTH DAILY WITH BREAKFAST. 10/21/18   Campbell Riches, MD  HYDROcodone-acetaminophen ([place]) 7.5-325 MG tablet Take 1-2 tablets by mouth every 6 (six) hours as needed for severe pain (pain score 7-10). 11/05/18   Mcarthur Rossetti, MD  leflunomide (ARAVA) 20 MG tablet Take 20 mg by mouth daily.  07/10/18   [provider]  metoprolol succinate (TOPROL-XL) 25 MG 24 hr tablet Take 25 mg by mouth daily. 10/07/18   [provider]  nitroGLYCERIN (NITROSTAT) 0.4 MG SL tablet Place 1 tablet (0.4 mg total) under the tongue every 5 (five) minutes as needed for chest pain. 12/24/17 07/26/19  [name], MD  ondansetron (ZOFRAN) 4 MG tablet Take 4 mg by mouth every 6 (six) hours as needed for nausea.    [provider]  potassium chloride SA (K-DUR) 20 MEQ tablet Take 1 tablet (20 mEq total) by mouth daily. 11/19/18   Isla Pence, MD  predniSONE (DELTASONE) 5 MG tablet Take 5 mg by mouth 2 (two) times daily.  11/02/16   [provider]  pregabalin (LYRICA) 200 MG capsule TAKE 1 CAPSULE BY MOUTH TWICE A DAY Patient taking  differently: Take 200 mg by mouth 2 (two) times daily.  07/27/18   [name], MD  PREZISTA 800 MG tablet TAKE 1 TABLET BY MOUTH DAILY. 10/21/18   Campbell Riches, MD  SAVAYSA 30 MG TABS tablet TAKE 1 TABLET (30 MG TOTAL) BY MOUTH DAILY. 08/06/18   [name], MD  sulfamethoxazole-trimethoprim (BACTRIM DS,SEPTRA DS) 800-160 MG tablet TAKE 1 TABLET BY MOUTH EVERY 12 (TWELVE) HOURS. 08/06/18   [name], MD  valACYclovir (VALTREX) 500 MG tablet Take 1 tablet (500 mg total) by mouth daily. 01/27/18   Campbell Riches, MD    Family History Family History  Problem Relation Age of Onset  . Breast cancer Mother   . Hypertension Mother   . Hyperlipidemia Mother   . Diabetes Mother   . Prostate cancer Father   . Colon polyps Father   . Hyperlipidemia Father   . Crohn's disease Paternal Aunt   . Diabetes Maternal Grandmother   . Diabetes Brother        x 3  . Heart attack Brother   . Heart disease Brother        x 3  . Heart attack Brother   . Hyperlipidemia Brother        x 3  . Alcohol abuse Daughter   . Drug abuse Daughter   . Asthma Brother   . CVA Brother   . Colon cancer Neg Hx   . Esophageal cancer Neg Hx   . Rectal cancer Neg Hx   . Stomach cancer Neg Hx     Social History Social History   Tobacco Use  . Smoking status: Never Smoker  . Smokeless tobacco: Never Used  . Tobacco comment: occ wine  Substance Use Topics  . Alcohol use: Yes    Comment: occasional wine- 1-2 per week  . Drug use: No     Allergies   Golimumab, Orencia [abatacept], Other, Peanut-containing drug products, Morphine, Oxycodone-acetaminophen, Penicillins, and Promethazine hcl   Review of Systems Review of Systems  Cardiovascular: Positive for leg swelling.  All other systems reviewed and are negative.    Physical Exam Updated Vital Signs BP 137/77   Pulse 89   Resp 14   SpO2 100%   Physical Exam Vitals signs and nursing note reviewed.  Constitutional:       Appearance: He is well-developed.  HENT:     Head: Normocephalic and atraumatic.     Mouth/Throat:     Mouth: Mucous membranes are moist.     Pharynx: Oropharynx is clear.  Eyes:     Extraocular Movements: Extraocular movements intact.     Pupils: Pupils are equal, round, and reactive to light.  Neck:     Musculoskeletal: Normal range of motion and neck supple.  Cardiovascular:     Rate and Rhythm: Normal rate and regular rhythm.  Pulmonary:     Effort: Pulmonary effort is normal.     Breath sounds: Normal breath sounds.  Abdominal:     General: Bowel sounds are normal.     Palpations: Abdomen is soft.  Musculoskeletal: Normal range of motion.     Right lower leg: Edema present.     Left lower leg: Edema present.  Skin:    General: Skin is warm.     Capillary Refill: Capillary refill takes less than 2 seconds.  Neurological:     General: No focal deficit present.     Mental Status: He is alert and oriented to person, place, and time.  Psychiatric:        Mood and Affect: Mood normal.        Behavior: Behavior normal.      ED Treatments / Results  Labs (all labs ordered are listed, but only abnormal results are displayed) Labs Reviewed  BASIC METABOLIC PANEL - Abnormal; Notable for the following components:      Result Value   Potassium 3.2 (*)    Glucose, Bld 110 (*)    Calcium 8.2 (*)    All other components within normal limits  CBC - Abnormal; Notable for the following components:   RBC 3.56 (*)    Hemoglobin 10.4 (*)    HCT 35.4 (*)    MCHC 29.4 (*)    RDW 15.7 (*)    All other components within normal limits  GASTROINTESTINAL PANEL BY PCR, STOOL (REPLACES STOOL CULTURE)  C DIFFICILE QUICK SCREEN W PCR REFLEX  MAGNESIUM    EKG EKG Interpretation  Date/Time:  Friday November 19 2018 12:43:40 EDT Ventricular Rate:  103 PR Interval:  144 QRS Duration: 70 QT Interval:  326 QTC Calculation: 427 R Axis:   -21 Text Interpretation:  Sinus tachycardia  Moderate voltage criteria for LVH, may be normal variant Cannot rule out Septal infarct , age undetermined Abnormal ECG No significant change since last tracing Confirmed by Isla Pence [phone]) on 11/19/2018 1:34:00 PM   Radiology Dg Chest 2 View  Result Date: 11/19/2018 CLINICAL DATA:  Shortness of breath, LEFT leg swelling for 2 days. History of CABG. EXAM: CHEST - 2 VIEW COMPARISON:  Chest x-ray dated 08/30/2018. Chest CT dated 11/17/2018 FINDINGS: Heart size and mediastinal contours are within normal limits. Median sternotomy wires in place. Mild scarring/atelectasis at the lung bases, LEFT greater than RIGHT. No pleural effusions seen. No pneumothorax seen. No acute or suspicious osseous finding. IMPRESSION: No active cardiopulmonary disease. No evidence of pneumonia or pulmonary edema. Electronically Signed   By: Franki Cabot M.D.   On: 11/19/2018 13:12    Procedures Procedures (including critical care time)  Medications Ordered in ED Medications  sodium chloride flush (NS) 0.9 % injection 3 mL (has no administration in time range)  furosemide (LASIX) injection 20 mg (20 mg Intravenous Given 11/19/18 1357)  potassium chloride SA (K-DUR) CR tablet 40 mEq (40 mEq Oral Given 11/19/18 1357)     Initial Impression / Assessment and Plan / ED Course  I have reviewed the triage vital signs and the nursing notes.  Pertinent labs & imaging results that were available during my care of the patient were reviewed by me and considered in my medical decision making (see chart for details).       Baseline anemia has improved.  He did get 2 units of prbcs on 6/24 for a hgb of 7.3.  Peripheral edema likely due to blood transfusion and off lasix.  BP ok here.  He was given 20 mg of lasix here and will be put on lasix 20 mg daily.  Pt has not had any diarrhea while here.  K was replaced.  Pt is stable for d/c.  Return if worse.  Final Clinical Impressions(s) / ED Diagnoses   Final diagnoses:   Peripheral edema  Hypokalemia    ED Discharge Orders         Ordered    furosemide (LASIX) 20 MG tablet  Daily     11/19/18 1523    potassium chloride SA (K-DUR) 20 MEQ tablet  Daily     11/19/18 1523           Isla Pence, MD 11/19/18 1524

## 2018-11-21 ENCOUNTER — Other Ambulatory Visit: Payer: Self-pay | Admitting: Internal Medicine

## 2018-11-21 DIAGNOSIS — B59 Pneumocystosis: Secondary | ICD-10-CM

## 2018-11-23 ENCOUNTER — Ambulatory Visit (HOSPITAL_COMMUNITY)
Admission: RE | Admit: 2018-11-23 | Discharge: 2018-11-23 | Disposition: A | Discharge status: Home / Self Care | Payer: Medicare Other | Source: Ambulatory Visit | Attending: Internal Medicine | Admitting: Internal Medicine

## 2018-11-23 ENCOUNTER — Encounter: Payer: Self-pay | Admitting: Internal Medicine

## 2018-11-23 ENCOUNTER — Ambulatory Visit (INDEPENDENT_AMBULATORY_CARE_PROVIDER_SITE_OTHER): Payer: Medicare Other | Admitting: Internal Medicine

## 2018-11-23 ENCOUNTER — Other Ambulatory Visit: Payer: Self-pay

## 2018-11-23 VITALS — BP 140/80 | HR 75 | Temp 98.1°F | Resp 16 | Ht 69.0 in | Wt 190.5 lb

## 2018-11-23 DIAGNOSIS — F3341 Major depressive disorder, recurrent, in partial remission: Secondary | ICD-10-CM

## 2018-11-23 DIAGNOSIS — I25708 Atherosclerosis of coronary artery bypass graft(s), unspecified, with other forms of angina pectoris: Secondary | ICD-10-CM | POA: Diagnosis not present

## 2018-11-23 DIAGNOSIS — E876 Hypokalemia: Secondary | ICD-10-CM | POA: Diagnosis not present

## 2018-11-23 DIAGNOSIS — C9100 Acute lymphoblastic leukemia not having achieved remission: Secondary | ICD-10-CM | POA: Diagnosis not present

## 2018-11-23 DIAGNOSIS — R011 Cardiac murmur, unspecified: Secondary | ICD-10-CM | POA: Diagnosis not present

## 2018-11-23 DIAGNOSIS — D508 Other iron deficiency anemias: Secondary | ICD-10-CM | POA: Diagnosis not present

## 2018-11-23 DIAGNOSIS — J810 Acute pulmonary edema: Secondary | ICD-10-CM

## 2018-11-23 DIAGNOSIS — D899 Disorder involving the immune mechanism, unspecified: Secondary | ICD-10-CM

## 2018-11-23 DIAGNOSIS — R600 Localized edema: Secondary | ICD-10-CM | POA: Diagnosis not present

## 2018-11-23 DIAGNOSIS — J849 Interstitial pulmonary disease, unspecified: Secondary | ICD-10-CM

## 2018-11-23 DIAGNOSIS — D849 Immunodeficiency, unspecified: Secondary | ICD-10-CM

## 2018-11-23 DIAGNOSIS — I779 Disorder of arteries and arterioles, unspecified: Secondary | ICD-10-CM

## 2018-11-23 MED ORDER — SODIUM CHLORIDE 0.9 % IV SOLN
750.0000 mg | Freq: Once | INTRAVENOUS | Status: AC
  Administered 2018-11-23: 750 mg via INTRAVENOUS
  Filled 2018-11-23: qty 15

## 2018-11-23 MED ORDER — POTASSIUM CHLORIDE CRYS ER 20 MEQ PO TBCR: 20.0000 meq | EXTENDED_RELEASE_TABLET | Freq: Three times a day (TID) | ORAL | 0 refills | Status: DC

## 2018-11-23 MED ORDER — SODIUM CHLORIDE 0.9 % IV SOLN
INTRAVENOUS | Status: DC | PRN
  Administered 2018-11-23: 250 mL via INTRAVENOUS

## 2018-11-23 MED ORDER — TORSEMIDE 20 MG PO TABS: 20.0000 mg | ORAL_TABLET | Freq: Every day | ORAL | 1 refills | Status: DC

## 2018-11-23 NOTE — Progress Notes (Signed)
PATIENT CARE CENTER NOTE  Diagnosis: Iron deficiency anemia due to dietary causes (D50.8)   Provider: [name], MD   Procedure: IV Injectafer   Note: Patient received Injectafer infusion via PIV. Tolerated well with no adverse reaction. Observed patient for 30 minutes post-infusion. Vital signs stable. Discharge instructions given. Patient alert, oriented and ambulatory with walker at discharge.

## 2018-11-23 NOTE — Discharge Instructions (Signed)

## 2018-11-23 NOTE — Patient Instructions (Signed)

## 2018-11-23 NOTE — Progress Notes (Signed)
Subjective:  Patient ID: [name], male    DOB: June 16, 1950  Age: 68 y.o. MRN: [id number]  CC: Hypertension   HPI [name] presents for f/up - He was recently seen in the ED for worsening lower extremity edema.  Once daily furosemide was prescribed which is helped some but not much.  The edema is worse on the left.  About a week ago he underwent an ultrasound of his lower extremity and there was no definite DVT but there was some concern that there might be one in the RLE.  There was too much edema for them to know for certain whether or not there was a DVT.  He had an elevated d-dimer and underwent a CT angio that was negative for PE.  He complains of weakness.  He got an iron infusion earlier today.  He denies any recent episodes of cough, shortness of breath, diaphoresis, palpitations, dizziness, or lightheadedness.  Outpatient Medications Prior to Visit  Medication Sig Dispense Refill  . acetaminophen (TYLENOL) 500 MG tablet Take 500-1,000 mg by mouth every 6 (six) hours as needed for mild pain or moderate pain.     [name] atorvastatin (LIPITOR) 10 MG tablet Take 1 tablet (10 mg total) by mouth at bedtime. 30 tablet 11  . Brexpiprazole (REXULTI) 0.25 MG TABS Take 1 tablet by mouth daily. 90 tablet 1  . EPINEPHrine (EPIPEN 2-PAK) 0.3 mg/0.3 mL IJ SOAJ injection Inject 0.3 mg into the muscle as needed for anaphylaxis.    [name] escitalopram (LEXAPRO) 20 MG tablet Take 1 tablet (20 mg total) by mouth daily. 90 tablet 1  . famotidine (PEPCID) 40 MG tablet Take 1 tablet (40 mg total) by mouth 2 (two) times daily. 932 tablet 1  . folic acid (FOLVITE) 1 MG tablet Take 1 mg by mouth daily.  11  . GENVOYA 150-150-200-10 MG TABS tablet TAKE 1 TABLET BY MOUTH DAILY WITH BREAKFAST. 30 tablet 2  . HYDROcodone-acetaminophen (NORCO) 7.5-325 MG tablet Take 1-2 tablets by mouth every 6 (six) hours as needed for severe pain (pain score 7-10). 40 tablet 0  . leflunomide (ARAVA) 20 MG tablet Take 20 mg by  mouth daily.     . metoprolol succinate (TOPROL-XL) 25 MG 24 hr tablet Take 25 mg by mouth daily.    . nitroGLYCERIN (NITROSTAT) 0.4 MG SL tablet Place 1 tablet (0.4 mg total) under the tongue every 5 (five) minutes as needed for chest pain. 25 tablet 2  . ondansetron (ZOFRAN) 4 MG tablet Take 4 mg by mouth every 6 (six) hours as needed for nausea.    . predniSONE (DELTASONE) 5 MG tablet Take 5 mg by mouth 2 (two) times daily.   1  . pregabalin (LYRICA) 200 MG capsule TAKE 1 CAPSULE BY MOUTH TWICE A DAY (Patient taking differently: Take 200 mg by mouth 2 (two) times daily. ) 180 capsule 1  . PREZISTA 800 MG tablet TAKE 1 TABLET BY MOUTH DAILY. 30 tablet 2  . SAVAYSA 30 MG TABS tablet TAKE 1 TABLET (30 MG TOTAL) BY MOUTH DAILY. 30 tablet 5  . sulfamethoxazole-trimethoprim (BACTRIM DS) 800-160 MG tablet TAKE 1 TABLET BY MOUTH EVERY 12 (TWELVE) HOURS. 60 tablet 3  . valACYclovir (VALTREX) 500 MG tablet Take 1 tablet (500 mg total) by mouth daily. 90 tablet 3  . furosemide (LASIX) 20 MG tablet Take 1 tablet (20 mg total) by mouth daily. 30 tablet 0  . potassium chloride SA (K-DUR) 20 MEQ tablet Take 1  tablet (20 mEq total) by mouth daily. 30 tablet 0  . 0.9 %  sodium chloride infusion      No facility-administered medications prior to visit.     ROS Review of Systems  Constitutional: Positive for fatigue. Negative for diaphoresis and unexpected weight change.  HENT: Negative.   Eyes: Negative for visual disturbance.  Respiratory: Negative for cough, chest tightness, shortness of breath and wheezing.   Cardiovascular: Positive for leg swelling. Negative for chest pain and palpitations.  Gastrointestinal: Negative for abdominal pain, constipation, diarrhea, nausea and vomiting.  Genitourinary: Negative.  Negative for difficulty urinating, dysuria and hematuria.  Musculoskeletal: Negative.   Skin: Positive for pallor.  Neurological: Positive for weakness. Negative for dizziness, light-headedness  and numbness.  Hematological: Negative for adenopathy. Does not bruise/bleed easily.  Psychiatric/Behavioral: Negative.     Objective:  BP 140/80 (BP Location: Left Arm, Patient Position: Sitting, Cuff Size: Normal)   Pulse 75   Temp 98.1 F (36.7 C) (Oral)   Resp 16   Ht 5\' 9"  (1.753 m)   Wt 190 lb 8 oz (86.4 kg)   SpO2 93%   BMI 28.13 kg/m   BP Readings from Last 3 Encounters:  11/23/18 134/87  11/23/18 140/80  11/19/18 137/77    Wt Readings from Last 3 Encounters:  11/23/18 190 lb 8 oz (86.4 kg)  11/18/18 197 lb (89.4 kg)  11/16/18 195 lb 8 oz (88.7 kg)    Physical Exam Vitals signs reviewed.  Constitutional:      General: He is not in acute distress.    Appearance: He is ill-appearing. He is not toxic-appearing or diaphoretic.  HENT:     Nose: Nose normal.     Mouth/Throat:     Mouth: Mucous membranes are moist.  Eyes:     General: No scleral icterus.    Conjunctiva/sclera: Conjunctivae normal.  Neck:     Musculoskeletal: Normal range of motion. No neck rigidity or muscular tenderness.  Cardiovascular:     Rate and Rhythm: Normal rate and regular rhythm.     Heart sounds: S1 normal and S2 normal. Murmur present. Systolic murmur present with a grade of 1/6. No diastolic murmur. No gallop.   Pulmonary:     Effort: Pulmonary effort is normal.     Breath sounds: No stridor. No wheezing, rhonchi or rales.  Abdominal:     General: Abdomen is flat. Bowel sounds are normal. There is no distension.     Palpations: There is no hepatomegaly, splenomegaly or mass.     Tenderness: There is no abdominal tenderness.  Musculoskeletal:     Right lower leg: 1+ Edema present.     Left lower leg: 2+ Edema present.  Lymphadenopathy:     Cervical: No cervical adenopathy.  Skin:    General: Skin is warm and dry.     Coloration: Skin is pale.  Neurological:     General: No focal deficit present.     Mental Status: He is alert.  Psychiatric:        Mood and Affect: Mood  normal.     Lab Results  Component Value Date   WBC 9.1 11/19/2018   HGB 10.4 (L) 11/19/2018   HCT 35.4 (L) 11/19/2018   PLT 163 11/19/2018   GLUCOSE 110 (H) 11/19/2018   CHOL 162 10/28/2018   TRIG 152 (H) 10/28/2018   HDL 35 (L) 10/28/2018   LDLCALC 102 (H) 10/28/2018   ALT 11 11/16/2018   AST 15 11/16/2018  NA 143 11/19/2018   K 3.2 (L) 11/19/2018   CL 108 11/19/2018   CREATININE 0.77 11/19/2018   BUN 8 11/19/2018   CO2 25 11/19/2018   TSH 1.29 11/16/2018   INR 1.61 11/19/2017   HGBA1C 5.1 11/16/2018   MICROALBUR 2.6 (H) 11/16/2018    No results found.  Assessment & Plan:   Nicholaos was seen today for hypertension.  Diagnoses and all orders for this visit:   Bilateral leg edema- He has not gotten much benefit from furosemide so I have asked him to upgrade to torsemide which has better bioavailability, is only taken once a day, and is overall more effective at treating edema. -     torsemide (DEMADEX) 20 MG tablet; Take 1 tablet (20 mg total) by mouth daily. -     ECHOCARDIOGRAM COMPLETE; Future  Acute lymphoblastic leukemia not having achieved remission (HCC)  Immunocompromised state ([place])- Will continue Bactrim DS to treat the PCP.  Recurrent major depressive disorder, in partial remission ([place])  Carotid artery disease, unspecified laterality ([place])  Acute pulmonary edema (HCC)  ILD (interstitial lung disease) ([place])  Atherosclerosis of coronary artery bypass graft(s), unspecified, with other forms of angina pectoris ([place])  Murmur, cardiac- I have asked him to undergo an echocardiogram to see if he has developed diastolic dysfunction, a decreased ejection fraction, or valvular disease. -     ECHOCARDIOGRAM COMPLETE; Future  Chronic hypokalemia -     potassium chloride SA (K-DUR) 20 MEQ tablet; Take 1 tablet (20 mEq total) by mouth 3 (three) times daily.   I have discontinued Ondra L. Herbig "Phil"'s furosemide. I have also changed his potassium chloride  SA. Additionally, I am having him start on torsemide. Lastly, I am having him maintain his predniSONE, folic acid, nitroGLYCERIN, valACYclovir, atorvastatin, leflunomide, pregabalin, Savaysa, acetaminophen, EPINEPHrine, ondansetron, Prezista, Genvoya, famotidine, Rexulti, escitalopram, metoprolol succinate, HYDROcodone-acetaminophen, and sulfamethoxazole-trimethoprim.  Meds ordered this encounter  Medications  . torsemide (DEMADEX) 20 MG tablet    Sig: Take 1 tablet (20 mg total) by mouth daily.    Dispense:  90 tablet    Refill:  1  . potassium chloride SA (K-DUR) 20 MEQ tablet    Sig: Take 1 tablet (20 mEq total) by mouth 3 (three) times daily.    Dispense:  270 tablet    Refill:  0     Follow-up: Return in about 1 month (around 12/24/2018).  [name], MD

## 2018-11-25 ENCOUNTER — Other Ambulatory Visit: Payer: Self-pay

## 2018-11-25 ENCOUNTER — Ambulatory Visit (INDEPENDENT_AMBULATORY_CARE_PROVIDER_SITE_OTHER): Payer: Medicare Other

## 2018-11-25 ENCOUNTER — Encounter: Payer: Self-pay | Admitting: Orthopaedic Surgery

## 2018-11-25 ENCOUNTER — Ambulatory Visit (INDEPENDENT_AMBULATORY_CARE_PROVIDER_SITE_OTHER): Payer: Medicare Other | Admitting: Orthopaedic Surgery

## 2018-11-25 DIAGNOSIS — Z96642 Presence of left artificial hip joint: Secondary | ICD-10-CM

## 2018-11-25 NOTE — Progress Notes (Signed)
The patient is following up about 3 weeks status post revision of an unstable left total hip arthroplasty with placing a constrained locking liner.  He is doing well overall.  He says he definitely feels better than his previous surgeries.  On exam his suture line looks good to remove the sutures in place Steri-Strips.  There is no drainage and no significant swelling.  His hip appears stable.  X-rays of the pelvis and left hip show that it is in place.  He still has been adhere to strict posterior hip precautions.  I would like to see him back in 6 weeks for repeat exam but no x-rays are needed.  If there is any issues before then he knows to let me know.  If he does sustain another dislocation this should not have any attempt to put this back in place and the emergency room.  He would likely need an open surgery.

## 2018-11-30 ENCOUNTER — Encounter: Payer: Self-pay | Admitting: Internal Medicine

## 2018-11-30 ENCOUNTER — Other Ambulatory Visit: Payer: Self-pay

## 2018-11-30 ENCOUNTER — Ambulatory Visit (HOSPITAL_COMMUNITY): Payer: Medicare Other | Attending: Cardiology

## 2018-11-30 ENCOUNTER — Ambulatory Visit (HOSPITAL_COMMUNITY)
Admission: RE | Admit: 2018-11-30 | Discharge: 2018-11-30 | Disposition: A | Discharge status: Home / Self Care | Payer: Medicare Other | Source: Ambulatory Visit | Attending: Internal Medicine | Admitting: Internal Medicine

## 2018-11-30 DIAGNOSIS — D508 Other iron deficiency anemias: Secondary | ICD-10-CM | POA: Diagnosis not present

## 2018-11-30 DIAGNOSIS — R011 Cardiac murmur, unspecified: Secondary | ICD-10-CM | POA: Diagnosis not present

## 2018-11-30 DIAGNOSIS — R600 Localized edema: Secondary | ICD-10-CM | POA: Diagnosis not present

## 2018-11-30 LAB — ECHOCARDIOGRAM COMPLETE

## 2018-11-30 MED ORDER — SODIUM CHLORIDE 0.9 % IV SOLN
750.0000 mg | Freq: Once | INTRAVENOUS | Status: AC
  Administered 2018-11-30: 750 mg via INTRAVENOUS
  Filled 2018-11-30: qty 15

## 2018-11-30 MED ORDER — SODIUM CHLORIDE 0.9 % IV SOLN
INTRAVENOUS | Status: DC | PRN
  Administered 2018-11-30: 250 mL via INTRAVENOUS

## 2018-11-30 NOTE — Progress Notes (Signed)
PATIENT CARE CENTER NOTE  Diagnosis: Iron deficiency anemia due to dietary causes (D50.8)   Provider: [name], MD   Procedure: IV Injectafer   Note: Patient received Injectafer infusion via PIV. Tolerated well with no adverse reaction. Observed patient for 30 minutes post-infusion. Vital signs stable. Discharge instructions given. Patient alert, oriented and ambulatory with walker at discharge.

## 2018-12-01 ENCOUNTER — Encounter: Payer: Self-pay | Admitting: Internal Medicine

## 2018-12-01 ENCOUNTER — Other Ambulatory Visit: Payer: Self-pay | Admitting: Internal Medicine

## 2018-12-01 DIAGNOSIS — I350 Nonrheumatic aortic (valve) stenosis: Secondary | ICD-10-CM

## 2018-12-01 DIAGNOSIS — I5033 Acute on chronic diastolic (congestive) heart failure: Secondary | ICD-10-CM

## 2018-12-01 NOTE — Progress Notes (Signed)
HPI The patient presented for followup of his known coronary disease.   He has had progressive dyspnea.  He did have a negative stress perfusion study for any evidence of reversible ischemia in 2016 although there was a fixed defect. His ejection fractions well-preserved. It was well preserved on echo with some tricuspid regurgitation.  He did have some slight abnormalities on pulmonary function testing and has seen [name].  He had some interstitial lung disease thought possibly to be related to his rheumatoid arthritis.   He did have recent hip surgery.  Since I last saw him he was in the ED earlier this month with increased leg swelling.   He had been off of his Lasix because of some low BPs.  He was given IV Lasix in the ED.  I reviewed these records for this visit.    He said that he had his Lasix decreased and then over 3 or 4 weeks he had increasing leg swelling.  He had an echocardiogram which are reviewed with him today.  He had normal left ventricular function.  There was no significant right ventricular dysfunction or evidence of pulmonary hypertension.  He had some aortic sclerosis but no significant valvular abnormalities.  He had gained about 20 pounds when his Lasix was stopped.  Since starting the torsemide he is down to 6 pounds.  He has progressive shortness of breath but this is been a chronic issue.  He has had recent hip surgeries as mentioned above and has had issues with this.  His big problem has been increasing left leg swelling.  He has been on his anticoagulation.  He has right leg swelling as well but not as much.  The left leg was the leg he had the surgery on.  He gets around slowly and motorized scooter when he has to go a distance.  He has chronic rheumatoid arthritis.  He is not been having any new chest pressure, neck or arm discomfort.  He has his baseline dyspnea but not describing new PND or orthopnea.  He is not been having any new palpitations, presyncope or  syncope.  Allergies  Allergen Reactions  . Golimumab Anaphylaxis    Simponi ARIA  . Orencia [Abatacept] Anaphylaxis  . Other Anaphylaxis and Hives    Pecans  . Peanut-Containing Drug Products Anaphylaxis, Hives and Swelling    Swelling of throat  . Morphine Other (See Comments)    Severe headache- Can tolerate Dilaudid, however  . Oxycodone-Acetaminophen Other (See Comments)    Headache 6.22.2020 patient is currently taking and tolerating  . Penicillins Rash and Other (See Comments)    FLUSHED & RED, also Has patient had a PCN reaction causing immediate rash, facial/tongue/throat swelling, SOB or lightheadedness with hypotension: Yes Has patient had a PCN reaction causing severe rash involving mucus membranes or skin necrosis: No Has patient had a PCN reaction that required hospitalization: No Has patient had a PCN reaction occurring within the last 10 years: No If all of the above answers are "NO", then may proceed with Cephalosporin use  . Promethazine Hcl Other (See Comments)    Makes him feel "drunk" at higher strengths    Current Outpatient Medications  Medication Sig Dispense Refill  . acetaminophen (TYLENOL) 500 MG tablet Take 500-1,000 mg by mouth every 6 (six) hours as needed for mild pain or moderate pain.     [name] atorvastatin (LIPITOR) 10 MG tablet Take 1 tablet (10 mg total) by mouth at bedtime.  30 tablet 11  . Brexpiprazole (REXULTI) 0.25 MG TABS Take 1 tablet by mouth daily. 90 tablet 1  . EPINEPHrine (EPIPEN 2-PAK) 0.3 mg/0.3 mL IJ SOAJ injection Inject 0.3 mg into the muscle as needed for anaphylaxis.    [name] escitalopram (LEXAPRO) 20 MG tablet Take 1 tablet (20 mg total) by mouth daily. 90 tablet 1  . famotidine (PEPCID) 40 MG tablet Take 1 tablet (40 mg total) by mouth 2 (two) times daily. 138 tablet 1  . folic acid (FOLVITE) 1 MG tablet Take 1 mg by mouth daily.  11  . GENVOYA 150-150-200-10 MG TABS tablet TAKE 1 TABLET BY MOUTH DAILY WITH BREAKFAST. 30 tablet 2  .  HYDROcodone-acetaminophen (NORCO) 7.5-325 MG tablet Take 1-2 tablets by mouth every 6 (six) hours as needed for severe pain (pain score 7-10). 40 tablet 0  . leflunomide (ARAVA) 20 MG tablet Take 20 mg by mouth daily.     . metoprolol succinate (TOPROL-XL) 25 MG 24 hr tablet Take 25 mg by mouth daily.    . nitroGLYCERIN (NITROSTAT) 0.4 MG SL tablet Place 1 tablet (0.4 mg total) under the tongue every 5 (five) minutes as needed for chest pain. 25 tablet 2  . ondansetron (ZOFRAN) 4 MG tablet Take 4 mg by mouth every 6 (six) hours as needed for nausea.    . potassium chloride SA (K-DUR) 20 MEQ tablet Take 1 tablet (20 mEq total) by mouth 3 (three) times daily. 270 tablet 0  . predniSONE (DELTASONE) 5 MG tablet Take 5 mg by mouth 2 (two) times daily.   1  . pregabalin (LYRICA) 200 MG capsule TAKE 1 CAPSULE BY MOUTH TWICE A DAY (Patient taking differently: Take 200 mg by mouth 2 (two) times daily. ) 180 capsule 1  . PREZISTA 800 MG tablet TAKE 1 TABLET BY MOUTH DAILY. 30 tablet 2  . SAVAYSA 30 MG TABS tablet TAKE 1 TABLET (30 MG TOTAL) BY MOUTH DAILY. 30 tablet 5  . sulfamethoxazole-trimethoprim (BACTRIM DS) 800-160 MG tablet TAKE 1 TABLET BY MOUTH EVERY 12 (TWELVE) HOURS. 60 tablet 3  . torsemide (DEMADEX) 20 MG tablet Take 1 tablet (20 mg total) by mouth daily. 90 tablet 1  . valACYclovir (VALTREX) 500 MG tablet Take 1 tablet (500 mg total) by mouth daily. 90 tablet 3   No current facility-administered medications for this visit.     Past Medical History:  Diagnosis Date  . Allergy   . Anxiety   . Carotid artery occlusion    40-60% right ICA stenosis (09/2008)  . Cataract   . Chronic back pain   . CLL (chronic lymphoblastic leukemia) dx 2010   Followed at [facility] q32mo, no current therapy   . Clotting disorder ([place])   . Coronary artery disease 2010   s/p CABG '10, sees Dr. Percival Spanish  . Depression   . Diverticulosis   . DVT, lower extremity, recurrent ([place]) 2008, 2009   LLE, chronic anticoag  since 2009  . Esophagitis   . Fibromyalgia   . Gallstones   . GERD (gastroesophageal reflux disease)   . Gout   . Gynecomastia, male   . H/O hiatal hernia 2008   surgery  . Hemorrhoids   . Hepatitis A yrs ago  . HIV infection ([place]) dx 1993  . Hypertension   . Impotence of organic origin   . Myocardial infarction ([place]) 2010    x 2  . Neuromuscular disorder (HCC)    neuropathy  . Osteoarthritis, knee  s/p B TKA  . Osteoporosis   . Pneumonia mrach, may, july 2017  . Rheumatoid arthritis(714.0) dx 2010   MTX, follows with rheum  . Seasonal allergies   . Secondary syphilis 07/24/14 dx   s/p 2 wks doxy  . Status post dilation of esophageal narrowing   . Stroke ([place]) [place]   . TIA (transient ischemic attack) 1997   mild residual L mouth droop  . Tubular adenoma of colon     Past Surgical History:  Procedure Laterality Date  . ANTERIOR HIP REVISION Left 08/17/2018   Procedure: ANTERIOR LEFT HIP REVISION;  Surgeon: Mcarthur Rossetti, MD;  Location: [place];  Service: Orthopedics;  Laterality: Left;  . ANTERIOR HIP REVISION Left 11/02/2018   Procedure: LEFT HIP CONSTRAINED LINER REVISION;  Surgeon: Mcarthur Rossetti, MD;  Location: [place];  Service: Orthopedics;  Laterality: Left;  . CHOLECYSTECTOMY    . COLONOSCOPY WITH PROPOFOL N/A 12/28/2012   Procedure: COLONOSCOPY WITH PROPOFOL;  Surgeon: Jerene Bears, MD;  Location: [facility];  Service: Gastroenterology;  Laterality: N/A;  . CORONARY ARTERY BYPASS GRAFT  2010   triple bypass  . ESOPHAGOGASTRODUODENOSCOPY (EGD) WITH PROPOFOL N/A 12/28/2012   Procedure: ESOPHAGOGASTRODUODENOSCOPY (EGD) WITH PROPOFOL;  Surgeon: Jerene Bears, MD;  [facility];  Service: Gastroenterology;  Laterality: N/A;  . ESOPHAGOGASTRODUODENOSCOPY (EGD) WITH PROPOFOL N/A 03/15/2013   Procedure: ESOPHAGOGASTRODUODENOSCOPY (EGD) WITH PROPOFOL;  Surgeon: Jerene Bears, MD;  [facility];  Service: Gastroenterology;   Laterality: N/A;  . ESOPHAGOGASTRODUODENOSCOPY (EGD) WITH PROPOFOL N/A 02/07/2016   Procedure: ESOPHAGOGASTRODUODENOSCOPY (EGD) WITH PROPOFOL;  Surgeon: Jerene Bears, MD;  [facility];  Service: Gastroenterology;  Laterality: N/A;  . HARDWARE REMOVAL N/A 07/02/2012   Procedure: HARDWARE REMOVAL;  Surgeon: Elaina Hoops, MD;  Location: [facility];  Service: Neurosurgery;  Laterality: N/A;  . HIATAL HERNIA REPAIR     wrap  . HIP CLOSED REDUCTION Left 08/15/2018   Procedure: CLOSED REDUCTION HIP;  Surgeon: Newt Minion, MD;  Location: [place];  Service: Orthopedics;  Laterality: Left;  . HIP CLOSED REDUCTION Left 08/28/2018   Procedure: CLOSED REDUCTION HIP FOR RECURRENT DISLOCATION AND DRESSING CHANGE;  Surgeon: Jessy Oto, MD;  Location: [place];  Service: Orthopedics;  Laterality: Left;  . HIP CLOSED REDUCTION Left 09/15/2018   Procedure: CLOSED REDUCTION HIP DISLOCATION;  Surgeon: Mcarthur Rossetti, MD;  Location: [place];  Service: Orthopedics;  Laterality: Left;  . HIP CLOSED REDUCTION Left 10/31/2018   Procedure: CLOSED REDUCTION LEFT TOTAL HIP;  Surgeon: Leandrew Koyanagi, MD;  Location: [place] [facility];  Service: Orthopedics;  Laterality: Left;  . INGUINAL HERNIA REPAIR Bilateral   . JOINT REPLACEMENT Left 1999  . KNEE ARTHROPLASTY  07/22/2011   Procedure: COMPUTER ASSISTED TOTAL KNEE ARTHROPLASTY;  Surgeon: Meredith Pel, MD;  Location: [place];  Service: Orthopedics;  Laterality: Right;  Right total knee arthroplasty  . MANDIBLE SURGERY Bilateral    tmj  . REPLACEMENT TOTAL KNEE Bilateral   . ring around testicle hernia reapir  184 and 1986   x 2  . ROTATOR CUFF REPAIR Right   . SHOULDER SURGERY Left   . SPINE SURGERY  2010   "rod and screws", "failed", lopwer spine,   . stent to heart x 1  2010  . TONSILLECTOMY    . TOTAL HIP ARTHROPLASTY Left 08/13/2018   Procedure: LEFT TOTAL HIP ARTHROPLASTY ANTERIOR APPROACH;  Surgeon: Mcarthur Rossetti, MD;  Location: [facility];  Service:  Orthopedics;  Laterality: Left;  . TOTAL HIP ARTHROPLASTY Left 09/17/2018   Procedure: ANTERIOR LEFT HIP REVISION;  Surgeon: Mcarthur Rossetti, MD;  Location: [place];  Service: Orthopedics;  Laterality: Left;  . UMBILICAL HERNIA REPAIR     x 1  . varicose vein     stripping  . VIDEO BRONCHOSCOPY Bilateral 10/16/2015   Procedure: VIDEO BRONCHOSCOPY WITHOUT FLUORO;  Surgeon: Brand Males, MD;  [facility];  Service: Cardiopulmonary;  Laterality: Bilateral;    ROS:  As stated in the HPI and negative for all other systems.  PHYSICAL EXAM Ht 5\' 9"  (1.753 m)   Wt 188 lb 9.6 oz (85.5 kg)   BMI 27.85 kg/m   GENERAL:  Well appearing NECK:  No jugular venous distention, waveform within normal limits, carotid upstroke brisk and symmetric, no bruits, no thyromegaly LUNGS:  Clear to auscultation bilaterally CHEST:  Unremarkable HEART:  PMI not displaced or sustained,S1 and S2 within normal limits, no S3, no S4, no clicks, no rubs, brief 2 out of 6 apical and left midsternal border murmur, no diastolic murmurs ABD:  Flat, positive bowel sounds normal in frequency in pitch, no bruits, no rebound, no guarding, no midline pulsatile mass, no hepatomegaly, no splenomegaly EXT:  2 plus pulses throughout, no edema, no cyanosis no clubbing     Lab Results  Component Value Date   CHOL 162 10/28/2018   TRIG 152 (H) 10/28/2018   HDL 35 (L) 10/28/2018   LDLCALC 102 (H) 10/28/2018     ASSESSMENT AND PLAN   CAD/DYSPNEA  I think this is baseline.  He had a negative perfusion study with no reversible ischemia in 2016.  He has not seen his pulmonologist in a long time I probably would facilitate him following up as his breathing is getting slowly more progressive.  I will be diuresing her as above but I do not suspect this is predominantly related to diastolic dysfunction.   CAROTID STENOSIS He had very mild stenosis 2 years ago.  No change in therapy.  HTN His blood pressure is at  target.  I will change his meds only as below.   AI This was mild on recent echo.  No change in therapy.  No further imaging.  EDEMA I suspect this is multifactorial.  He is been on anticoagulation so I do not suspect a DVT.  I think this is related in part to surgery, venous insufficiency, malnutrition, dependent legs, decreased ability to walk using a muscle motion is venous return.  Of note he had an equivocal study with venous Dopplers ordered by his primary provider.  There was a questionable partial thrombosis of the right PTV although they could not see this vessel and this is not the leg that is swollen.

## 2018-12-02 ENCOUNTER — Telehealth: Payer: Self-pay

## 2018-12-02 ENCOUNTER — Ambulatory Visit (INDEPENDENT_AMBULATORY_CARE_PROVIDER_SITE_OTHER): Payer: Medicare Other | Admitting: Cardiology

## 2018-12-02 ENCOUNTER — Telehealth: Payer: Self-pay | Admitting: Internal Medicine

## 2018-12-02 ENCOUNTER — Other Ambulatory Visit: Payer: Self-pay

## 2018-12-02 ENCOUNTER — Encounter: Payer: Self-pay | Admitting: Cardiology

## 2018-12-02 VITALS — BP 114/64 | Ht 69.0 in | Wt 188.6 lb

## 2018-12-02 DIAGNOSIS — Z79899 Other long term (current) drug therapy: Secondary | ICD-10-CM

## 2018-12-02 DIAGNOSIS — I25708 Atherosclerosis of coronary artery bypass graft(s), unspecified, with other forms of angina pectoris: Secondary | ICD-10-CM

## 2018-12-02 DIAGNOSIS — E876 Hypokalemia: Secondary | ICD-10-CM

## 2018-12-02 DIAGNOSIS — I10 Essential (primary) hypertension: Secondary | ICD-10-CM

## 2018-12-02 DIAGNOSIS — R600 Localized edema: Secondary | ICD-10-CM

## 2018-12-02 LAB — BASIC METABOLIC PANEL
BUN/Creatinine Ratio: 14 (ref 10–24)
BUN: 12 mg/dL (ref 8–27)
CO2: 24 mmol/L (ref 20–29)
Calcium: 8.2 mg/dL — ABNORMAL LOW (ref 8.6–10.2)
Chloride: 100 mmol/L (ref 96–106)
Creatinine, Ser: 0.88 mg/dL (ref 0.76–1.27)
GFR calc Af Amer: 101 mL/min/{1.73_m2} (ref >59)
GFR calc non Af Amer: 87 mL/min/{1.73_m2} (ref >59)
Glucose: 90 mg/dL (ref 65–99)
Potassium: 3.4 mmol/L — ABNORMAL LOW (ref 3.5–5.2)
Sodium: 138 mmol/L (ref 134–144)

## 2018-12-02 MED ORDER — POTASSIUM CHLORIDE CRYS ER 20 MEQ PO TBCR: 20.0000 meq | EXTENDED_RELEASE_TABLET | Freq: Three times a day (TID) | ORAL | 1 refills | Status: DC

## 2018-12-02 MED ORDER — TORSEMIDE 20 MG PO TABS: 40.0000 mg | ORAL_TABLET | Freq: Every day | ORAL | 1 refills | Status: DC

## 2018-12-02 NOTE — Telephone Encounter (Signed)
Call returned to [facility], receptionist I am assuming hung up by accident. Called back, got voicemail. Will await a call back.

## 2018-12-02 NOTE — Telephone Encounter (Signed)
Email from pt:  [name] "Phil" to [name], MD   12/01/18 7:42 AM There are several new heart tests in my charts. Would you be able to go over them to see if there is anything to be concerned about. Thanks in advance. [name]

## 2018-12-02 NOTE — Patient Instructions (Addendum)
Medication Instructions:  TAKE TORSEMIDE 40MG  DAILY X5DAYS THEN BACK TO 20MG  DAILY  TAKE POTASSIUM THREE TIMES DAILY-WE WILL CALL YOU TO ADJUST AFTER LAB TODAY RESULTS  If you need a refill on your cardiac medications before your next appointment, please call your pharmacy.  Labwork: BMET TODAY THEN AGAIN IN 7 DAYS(12-09-2018)   HERE IN OUR OFFICE AT [facility]     You will NOT need to fast   Take the provided lab slips with you to the lab for your blood draw.   When you have your labs (blood work) drawn today and your tests are completely normal, you will receive your results only by MyChart Message (if you have MyChart) -OR-  A paper copy in the mail.  If you have any lab test that is abnormal or we need to change your treatment, we will call you to review these results.  Special Instructions: ELEVATE YOUR FEET ALL DAY FOR 7 DAYS WITH COMPRESSION STOCKINGS.  Follow-Up: You will need a follow up appointment in 7 days WITH KATHRYN LAWRENCE,DNP 12-09-2018@1030AM .   You may see [name], MD or one of the following Advanced Practice Providers on your designated Care Team:  Rosaria Ferries, PA-C   Jory Sims, DNP, ANP     At [facility], you and your health needs are our priority.  As part of our continuing mission to provide you with exceptional heart care, we have created designated Provider Care Teams.  These Care Teams include your primary Cardiologist (physician) and Advanced Practice Providers (APPs -  Physician Assistants and Nurse Practitioners) who all work together to provide you with the care you need, when you need it.  Thank you for choosing [facility] at [facility]!!

## 2018-12-03 DIAGNOSIS — M150 Primary generalized (osteo)arthritis: Secondary | ICD-10-CM | POA: Diagnosis not present

## 2018-12-03 DIAGNOSIS — M1A09X0 Idiopathic chronic gout, multiple sites, without tophus (tophi): Secondary | ICD-10-CM | POA: Diagnosis not present

## 2018-12-03 DIAGNOSIS — Z21 Asymptomatic human immunodeficiency virus [HIV] infection status: Secondary | ICD-10-CM | POA: Diagnosis not present

## 2018-12-03 DIAGNOSIS — R29818 Other symptoms and signs involving the nervous system: Secondary | ICD-10-CM | POA: Diagnosis not present

## 2018-12-03 DIAGNOSIS — J849 Interstitial pulmonary disease, unspecified: Secondary | ICD-10-CM | POA: Diagnosis not present

## 2018-12-03 DIAGNOSIS — C9110 Chronic lymphocytic leukemia of B-cell type not having achieved remission: Secondary | ICD-10-CM | POA: Diagnosis not present

## 2018-12-03 DIAGNOSIS — M0609 Rheumatoid arthritis without rheumatoid factor, multiple sites: Secondary | ICD-10-CM | POA: Diagnosis not present

## 2018-12-03 DIAGNOSIS — B59 Pneumocystosis: Secondary | ICD-10-CM | POA: Diagnosis not present

## 2018-12-03 DIAGNOSIS — M2550 Pain in unspecified joint: Secondary | ICD-10-CM | POA: Diagnosis not present

## 2018-12-03 NOTE — Telephone Encounter (Signed)
Called [facility] at [phone] and was advised that I was called #9 at the time. Will close this message as [place] has Epic and should be able to view all of MW and MR's office notes.

## 2018-12-06 ENCOUNTER — Telehealth: Payer: Self-pay | Admitting: *Deleted

## 2018-12-06 ENCOUNTER — Encounter: Payer: Self-pay | Admitting: Orthopaedic Surgery

## 2018-12-06 NOTE — Telephone Encounter (Addendum)
Left message for pt to call   ----- Message from [name], MD sent at 12/03/2018  2:51 PM EDT ----- Please call him and tell him to take an extra 40 meq of potassium today and then to proceed as on his AVS.  He needs a BMET next week.  Call Mr. Ganoe with the results and send results to [name], MD

## 2018-12-06 NOTE — Telephone Encounter (Signed)
Follow up ° ° °Patient  Is returning your call. Please call. °

## 2018-12-06 NOTE — Telephone Encounter (Signed)
Called patient no answer.LMTC. 

## 2018-12-06 NOTE — Telephone Encounter (Signed)
Spoke to patient Dr.Hochrein's advice given.Stated he will take a extra 40 meq potassium today.He is scheduled to have repeat lab this Wed 7/29 and he will keep appointment with Jory Sims DNP 7/30 at 10:30 am.

## 2018-12-06 NOTE — Telephone Encounter (Signed)
Returned call to patient no answer.LMTC. 

## 2018-12-08 ENCOUNTER — Telehealth: Payer: Self-pay | Admitting: Adult Health

## 2018-12-08 DIAGNOSIS — Z79899 Other long term (current) drug therapy: Secondary | ICD-10-CM | POA: Diagnosis not present

## 2018-12-08 NOTE — Telephone Encounter (Signed)
° ° °Virtual Visit Pre-Appointment Phone Call ° °"(Name), I am calling you today to discuss your upcoming appointment. We are currently trying to limit exposure to the virus that causes COVID-19 by seeing patients at home rather than in the office." ° °1. Confirm consent - "In the setting of the current Covid19 crisis, you are scheduled for a (phone or video) visit with your provider on (date) at (time).  Just as we do with many in-office visits, in order for you to participate in this visit, we must obtain consent.  If you'd like, I can send this to your mychart (if signed up) or email for you to review.  Otherwise, I can obtain your verbal consent now.  All virtual visits are billed to your insurance company just like a normal visit would be.  By agreeing to a virtual visit, we'd like you to understand that the technology does not allow for your provider to perform an examination, and thus may limit your provider's ability to fully assess your condition. If your provider identifies any concerns that need to be evaluated in person, we will make arrangements to do so.  Finally, though the technology is pretty good, we cannot assure that it will always work on either your or our end, and in the setting of a video visit, we may have to convert it to a phone-only visit.  In either situation, we cannot ensure that we have a secure connection.  Are you willing to proceed?" STAFF: Did the patient verbally acknowledge consent to telehealth visit? Document YES/NO here:  Yes ° ° ° ° ° °FULL LENGTH CONSENT FOR TELE-HEALTH VISIT  ° °I hereby voluntarily request, consent and authorize [facility] and its employed or contracted physicians, physician assistants, nurse practitioners or other licensed health care professionals (the Practitioner), to provide me with telemedicine health care services (the “Services") as deemed necessary by the treating Practitioner. I acknowledge and consent to receive the Services by the  Practitioner via telemedicine. I understand that the telemedicine visit will involve communicating with the Practitioner through live audiovisual communication technology and the disclosure of certain medical information by electronic transmission. I acknowledge that I have been given the opportunity to request an in-person assessment or other available alternative prior to the telemedicine visit and am voluntarily participating in the telemedicine visit. ° °I understand that I have the right to withhold or withdraw my consent to the use of telemedicine in the course of my care at any time, without affecting my right to future care or treatment, and that the Practitioner or I may terminate the telemedicine visit at any time. I understand that I have the right to inspect all information obtained and/or recorded in the course of the telemedicine visit and may receive copies of available information for a reasonable fee.  I understand that some of the potential risks of receiving the Services via telemedicine include:  °• Delay or interruption in medical evaluation due to technological equipment failure or disruption; °• Information transmitted may not be sufficient (e.g. poor resolution of images) to allow for appropriate medical decision making by the Practitioner; and/or  °• In rare instances, security protocols could fail, causing a breach of personal health information. ° °Furthermore, I acknowledge that it is my responsibility to provide information about my medical history, conditions and care that is complete and accurate to the best of my ability. I acknowledge that Practitioner's advice, recommendations, and/or decision may be based on factors not within their control, such   as incomplete or inaccurate data provided by me or distortions of diagnostic images or specimens that may result from electronic transmissions. I understand that the practice of medicine is not an exact science and that Practitioner makes  no warranties or guarantees regarding treatment outcomes. I acknowledge that I will receive a copy of this consent concurrently upon execution via email to the email address I last provided but may also request a printed copy by calling the office of [facility].   ° °I understand that my insurance will be billed for this visit.  ° °I have read or had this consent read to me. °• I understand the contents of this consent, which adequately explains the benefits and risks of the Services being provided via telemedicine.  °• I have been provided ample opportunity to ask questions regarding this consent and the Services and have had my questions answered to my satisfaction. °• I give my informed consent for the services to be provided through the use of telemedicine in my medical care ° °By participating in this telemedicine visit I agree to the above. ° °

## 2018-12-09 ENCOUNTER — Encounter: Payer: Self-pay | Admitting: Adult Health

## 2018-12-09 ENCOUNTER — Telehealth (INDEPENDENT_AMBULATORY_CARE_PROVIDER_SITE_OTHER): Payer: Medicare Other | Admitting: Adult Health

## 2018-12-09 VITALS — BP 134/75 | HR 101 | Ht 69.0 in | Wt 179.5 lb

## 2018-12-09 DIAGNOSIS — I2510 Atherosclerotic heart disease of native coronary artery without angina pectoris: Secondary | ICD-10-CM | POA: Diagnosis not present

## 2018-12-09 DIAGNOSIS — I5032 Chronic diastolic (congestive) heart failure: Secondary | ICD-10-CM

## 2018-12-09 DIAGNOSIS — I779 Disorder of arteries and arterioles, unspecified: Secondary | ICD-10-CM

## 2018-12-09 DIAGNOSIS — I825Y9 Chronic embolism and thrombosis of unspecified deep veins of unspecified proximal lower extremity: Secondary | ICD-10-CM

## 2018-12-09 DIAGNOSIS — R0609 Other forms of dyspnea: Secondary | ICD-10-CM

## 2018-12-09 LAB — BASIC METABOLIC PANEL
BUN/Creatinine Ratio: 13 (ref 10–24)
BUN: 12 mg/dL (ref 8–27)
CO2: 24 mmol/L (ref 20–29)
Calcium: 8 mg/dL — ABNORMAL LOW (ref 8.6–10.2)
Chloride: 100 mmol/L (ref 96–106)
Creatinine, Ser: 0.93 mg/dL (ref 0.76–1.27)
GFR calc Af Amer: 96 mL/min/{1.73_m2} (ref >59)
GFR calc non Af Amer: 83 mL/min/{1.73_m2} (ref >59)
Glucose: 103 mg/dL — ABNORMAL HIGH (ref 65–99)
Potassium: 3.5 mmol/L (ref 3.5–5.2)
Sodium: 139 mmol/L (ref 134–144)

## 2018-12-09 NOTE — Progress Notes (Signed)
Telehealth Visit: Connected with video but problems with microphone. Patient unable to hear me. Changed to phone visit.   Patient has given verbal permission to conduct this visit via virtual appointment and to bill insurance 12/09/2018 10:30 am      Date:  12/09/2018   ID:  [name], DOB [birth year], [name]  Patient location: Home Provider location: Home  PCP:  [name], MD  Cardiologist:  [name], MD  Electrophysiologist:  None   Evaluation Performed: Follow-up  Chief Complaint: Follow-up  History of Present Illness:    [name] is a 68 y.o. male we are following for ongoing assessment and management of coronary artery disease, chronic dyspnea.  Nuclear medicine study in 2016 revealed no evidence of reversible ischemia although there was a fixed defect.  He is followed by [name] due to abnormal PFTs and was found to have interstitial lung disease thought to possibly be related to rheumatoid arthritis. ' When last seen by Dr. Percival Spanish on 12/02/2018 the patient had complaints of chronic lower extremity edema he had had some weight gain of 20 pounds after Lasix had been discontinued in the setting of hypotension.  He has had recent hip surgery on the left with some distal lower extremity edema.  He continued on Savaysa 30 mg daily.  At the time of that visit the patient was stable from a cardiac standpoint, he was referred back to his pulmonologist as he had not seen him for several months.  He was continued on torsemide as directed.  It was felt that some of his lower extremity edema was related to venous insufficiency, dependent edema and sedentary lifestyle postoperatively.  Mr. [name] is doing well today.  He is not having any further complaints of lower extremity edema.  He is walking better using a cane for ambulation is improving each day concerning his energy level and stamina along with balance post hip repair.  He states his breathing is much better,  and does not have significant dyspnea on exertion.  He is medically compliant.  He is interested and the results of his labs and echocardiogram.  The patient does not have symptoms concerning for COVID-19 infection (fever, chills, cough, or new shortness of breath).    Past Medical History:  Diagnosis Date  . Allergy   . Anxiety   . Carotid artery occlusion    40-60% right ICA stenosis (09/2008)  . Cataract   . Chronic back pain   . CLL (chronic lymphoblastic leukemia) dx 2010   Followed at [facility] q17mo, no current therapy   . Clotting disorder ([place])   . Coronary artery disease 2010   s/p CABG '10, sees Dr. Percival Spanish  . Depression   . Diverticulosis   . DVT, lower extremity, recurrent ([place]) 2008, 2009   LLE, chronic anticoag since 2009  . Esophagitis   . Fibromyalgia   . Gallstones   . GERD (gastroesophageal reflux disease)   . Gout   . Gynecomastia, male   . H/O hiatal hernia 2008   surgery  . Hemorrhoids   . Hepatitis A yrs ago  . HIV infection ([place]) dx 1993  . Hypertension   . Impotence of organic origin   . Myocardial infarction ([place]) 2010    x 2  . Neuromuscular disorder ([facility])    neuropathy  . Osteoarthritis, knee    s/p B TKA  . Osteoporosis   . Pneumonia mrach, may, july 2017  . Rheumatoid arthritis(714.0) dx 2010  MTX, follows with rheum  . Seasonal allergies   . Secondary syphilis 07/24/14 dx   s/p 2 wks doxy  . Status post dilation of esophageal narrowing   . Stroke ([place]) [place]   . TIA (transient ischemic attack) 1997   mild residual L mouth droop  . Tubular adenoma of colon    Past Surgical History:  Procedure Laterality Date  . ANTERIOR HIP REVISION Left 08/17/2018   Procedure: ANTERIOR LEFT HIP REVISION;  Surgeon: Mcarthur Rossetti, MD;  Location: [place];  Service: Orthopedics;  Laterality: Left;  . ANTERIOR HIP REVISION Left 11/02/2018   Procedure: LEFT HIP CONSTRAINED LINER REVISION;  Surgeon: Mcarthur Rossetti, MD;  Location: [place];   Service: Orthopedics;  Laterality: Left;  . CHOLECYSTECTOMY    . COLONOSCOPY WITH PROPOFOL N/A 12/28/2012   Procedure: COLONOSCOPY WITH PROPOFOL;  Surgeon: Jerene Bears, MD;  [facility];  Service: Gastroenterology;  Laterality: N/A;  . CORONARY ARTERY BYPASS GRAFT  2010   triple bypass  . ESOPHAGOGASTRODUODENOSCOPY (EGD) WITH PROPOFOL N/A 12/28/2012   Procedure: ESOPHAGOGASTRODUODENOSCOPY (EGD) WITH PROPOFOL;  Surgeon: Jerene Bears, MD;  [facility];  Service: Gastroenterology;  Laterality: N/A;  . ESOPHAGOGASTRODUODENOSCOPY (EGD) WITH PROPOFOL N/A 03/15/2013   Procedure: ESOPHAGOGASTRODUODENOSCOPY (EGD) WITH PROPOFOL;  Surgeon: Jerene Bears, MD;  [facility];  Service: Gastroenterology;  Laterality: N/A;  . ESOPHAGOGASTRODUODENOSCOPY (EGD) WITH PROPOFOL N/A 02/07/2016   Procedure: ESOPHAGOGASTRODUODENOSCOPY (EGD) WITH PROPOFOL;  Surgeon: Jerene Bears, MD;  [facility];  Service: Gastroenterology;  Laterality: N/A;  . HARDWARE REMOVAL N/A 07/02/2012   Procedure: HARDWARE REMOVAL;  Surgeon: Elaina Hoops, MD;  Location: [place] [facility];  Service: Neurosurgery;  Laterality: N/A;  . HIATAL HERNIA REPAIR     wrap  . HIP CLOSED REDUCTION Left 08/15/2018   Procedure: CLOSED REDUCTION HIP;  Surgeon: Newt Minion, MD;  Location: [place];  Service: Orthopedics;  Laterality: Left;  . HIP CLOSED REDUCTION Left 08/28/2018   Procedure: CLOSED REDUCTION HIP FOR RECURRENT DISLOCATION AND DRESSING CHANGE;  Surgeon: Jessy Oto, MD;  Location: [place];  Service: Orthopedics;  Laterality: Left;  . HIP CLOSED REDUCTION Left 09/15/2018   Procedure: CLOSED REDUCTION HIP DISLOCATION;  Surgeon: Mcarthur Rossetti, MD;  Location: [place];  Service: Orthopedics;  Laterality: Left;  . HIP CLOSED REDUCTION Left 10/31/2018   Procedure: CLOSED REDUCTION LEFT TOTAL HIP;  Surgeon: Leandrew Koyanagi, MD;  Location: [place];  Service: Orthopedics;  Laterality: Left;  . INGUINAL HERNIA REPAIR  Bilateral   . JOINT REPLACEMENT Left 1999  . KNEE ARTHROPLASTY  07/22/2011   Procedure: COMPUTER ASSISTED TOTAL KNEE ARTHROPLASTY;  Surgeon: Meredith Pel, MD;  Location: [place];  Service: Orthopedics;  Laterality: Right;  Right total knee arthroplasty  . MANDIBLE SURGERY Bilateral    tmj  . REPLACEMENT TOTAL KNEE Bilateral   . ring around testicle hernia reapir  184 and 1986   x 2  . ROTATOR CUFF REPAIR Right   . SHOULDER SURGERY Left   . SPINE SURGERY  2010   "rod and screws", "failed", lopwer spine,   . stent to heart x 1  2010  . TONSILLECTOMY    . TOTAL HIP ARTHROPLASTY Left 08/13/2018   Procedure: LEFT TOTAL HIP ARTHROPLASTY ANTERIOR APPROACH;  Surgeon: Mcarthur Rossetti, MD;  Location: [place];  Service: Orthopedics;  Laterality: Left;  . TOTAL HIP ARTHROPLASTY Left 09/17/2018   Procedure: ANTERIOR LEFT HIP  REVISION;  Surgeon: Mcarthur Rossetti, MD;  Location: [place];  Service: Orthopedics;  Laterality: Left;  . UMBILICAL HERNIA REPAIR     x 1  . varicose vein     stripping  . VIDEO BRONCHOSCOPY Bilateral 10/16/2015   Procedure: VIDEO BRONCHOSCOPY WITHOUT FLUORO;  Surgeon: Brand Males, MD;  [facility];  Service: Cardiopulmonary;  Laterality: Bilateral;     Current Meds  Medication Sig  . acetaminophen (TYLENOL) 500 MG tablet Take 500-1,000 mg by mouth every 6 (six) hours as needed for mild pain or moderate pain.   [name] atorvastatin (LIPITOR) 10 MG tablet Take 1 tablet (10 mg total) by mouth at bedtime.  . Brexpiprazole (REXULTI) 0.25 MG TABS Take 1 tablet by mouth daily.  [name] EPINEPHrine (EPIPEN 2-PAK) 0.3 mg/0.3 mL IJ SOAJ injection Inject 0.3 mg into the muscle as needed for anaphylaxis.  [name] escitalopram (LEXAPRO) 20 MG tablet Take 1 tablet (20 mg total) by mouth daily.  . famotidine (PEPCID) 40 MG tablet Take 1 tablet (40 mg total) by mouth 2 (two) times daily.  . folic acid (FOLVITE) 1 MG tablet Take 1 mg by mouth daily.  . GENVOYA 150-150-200-10 MG  TABS tablet TAKE 1 TABLET BY MOUTH DAILY WITH BREAKFAST.  [name] HYDROcodone-acetaminophen (NORCO) 7.5-325 MG tablet Take 1-2 tablets by mouth every 6 (six) hours as needed for severe pain (pain score 7-10).  [name] leflunomide (ARAVA) 20 MG tablet Take 20 mg by mouth daily.   . metoprolol succinate (TOPROL-XL) 25 MG 24 hr tablet Take 25 mg by mouth daily.  . nitroGLYCERIN (NITROSTAT) 0.4 MG SL tablet Place 1 tablet (0.4 mg total) under the tongue every 5 (five) minutes as needed for chest pain.  [name] ondansetron (ZOFRAN) 4 MG tablet Take 4 mg by mouth every 6 (six) hours as needed for nausea.  . potassium chloride SA (K-DUR) 20 MEQ tablet Take 1 tablet (20 mEq total) by mouth 3 (three) times daily. MAY TAKE ADDITIONAL TAB AS DIRECTED BY MD  . predniSONE (DELTASONE) 5 MG tablet Take 5 mg by mouth 2 (two) times daily.   . pregabalin (LYRICA) 200 MG capsule TAKE 1 CAPSULE BY MOUTH TWICE A DAY (Patient taking differently: Take 200 mg by mouth 2 (two) times daily. )  . PREZISTA 800 MG tablet TAKE 1 TABLET BY MOUTH DAILY.  [name] SAVAYSA 30 MG TABS tablet TAKE 1 TABLET (30 MG TOTAL) BY MOUTH DAILY.  [name] sulfamethoxazole-trimethoprim (BACTRIM DS) 800-160 MG tablet TAKE 1 TABLET BY MOUTH EVERY 12 (TWELVE) HOURS.  [name] torsemide (DEMADEX) 20 MG tablet Take 2 tablets (40 mg total) by mouth daily. 40MG  X5 DAYS THEN BACK TO 20MG  DAILY  . valACYclovir (VALTREX) 500 MG tablet Take 1 tablet (500 mg total) by mouth daily.     Allergies:   Golimumab, Orencia [abatacept], Other, Peanut-containing drug products, Morphine, Oxycodone-acetaminophen, Penicillins, and Promethazine hcl   Social History   Tobacco Use  . Smoking status: Never Smoker  . Smokeless tobacco: Never Used  . Tobacco comment: occ wine  Substance Use Topics  . Alcohol use: Yes    Comment: occasional wine- 1-2 per week  . Drug use: No     Family Hx: The patient's family history includes Alcohol abuse in his daughter; Asthma in his brother; Breast cancer in his  mother; CVA in his brother; Colon polyps in his father; Crohn's disease in his paternal aunt; Diabetes in his brother, maternal grandmother, and mother; Drug abuse in his daughter; Heart attack in  his brother and brother; Heart disease in his brother; Hyperlipidemia in his brother, father, and mother; Hypertension in his mother; Prostate cancer in his father. There is no history of Colon cancer, Esophageal cancer, Rectal cancer, or Stomach cancer.  ROS:   Please see the history of present illness.    All other systems reviewed and are negative.   Prior CV studies:   The following studies were reviewed today: Echocardiogram: 11/30/2018 1. The left ventricle has normal systolic function with an ejection fraction of 60-65%. The cavity size was normal. Left ventricular diastolic Doppler parameters are consistent with impaired relaxation.  2. The right ventricle has normal systolic function. The cavity was mildly enlarged. There is no increase in right ventricular wall thickness.  3. Right atrial size was mildly dilated.  4. The aortic valve is tricuspid. Mild thickening of the aortic valve. Moderate calcification of the aortic valve. Aortic valve regurgitation is mild by color flow Doppler. Mild stenosis of the aortic valve.  5. Peak aortic velocity: 2.73m/s, mean gradient 49mmHg.  6. The aorta is normal in size and structure.  Labs/Other Tests and Data Reviewed:    EKG: Not completed this office visit-virtual  Recent Labs: 11/16/2018: ALT 11; Pro B Natriuretic peptide (BNP) 163.0; TSH 1.29 11/19/2018: Hemoglobin 10.4; Magnesium 1.9; Platelets 163 12/08/2018: BUN 12; Creatinine, Ser 0.93; Potassium 3.5; Sodium 139   Recent Lipid Panel Lab Results  Component Value Date/Time   CHOL 162 10/28/2018 11:04 AM   TRIG 152 (H) 10/28/2018 11:04 AM   HDL 35 (L) 10/28/2018 11:04 AM   CHOLHDL 4.6 10/28/2018 11:04 AM   LDLCALC 102 (H) 10/28/2018 11:04 AM    Wt Readings from Last 3 Encounters:  12/09/18  179 lb 8 oz (81.4 kg)  12/02/18 188 lb 9.6 oz (85.5 kg)  11/23/18 190 lb 8 oz (86.4 kg)     Objective:    Vital Signs:  BP 134/75   Pulse (!) 101   Ht 5\' 9"  (1.753 m)   Wt 179 lb 8 oz (81.4 kg)   BMI 26.51 kg/m    General: Awake alert oriented no acute distress Respirations: Normal inspiratory expiratory effort, no coughing, no dyspnea with speaking. Neuro: No focal deficits or hearing issues. Psych: Normal affect responds appropriately.  ASSESSMENT & PLAN:    1.  Coronary artery disease: History of bypass grafting.  The patient is without complaints at this time.  He remains as active as he can be as he is recovering from hip repair.  He denies any chest pain or chest pressure.  He is medically compliant.  I reviewed results of echocardiogram with him and he is pleased with the results and explanation.  I will not make any changes in his regimen at this time.  2.  Chronic dyspnea on exertion: He continues to follow with pulmonology.  Defer to their recommendations concerning medication changes or further testing.  Currently he appears to be breathing well and has no subjective complaints concerning breathing status.  3.  History of DVT: Chronic, on anticoagulation therapy.  Followed by PCP.  RV status per echocardiogram revealed only mildly enlarged cavity, without hypertrophy.  No complaints of bleeding or excessive bruising.  COVID-19 Education: The signs and symptoms of COVID-19 were discussed with the patient and how to seek care for testing (follow up with PCP or arrange E-visit).  The importance of social distancing was discussed today.  Time:   Today, I have spent minutes 15 with the patient with telehealth  technology discussing the above problems.     Medication Adjustments/Labs and Tests Ordered: Current medicines are reviewed at length with the patient today.  Concerns regarding medicines are outlined above.   Tests Ordered: No orders of the defined types were placed  in this encounter.   Medication Changes: No orders of the defined types were placed in this encounter.   Disposition:  Follow up 6 months with Dr. Percival Spanish.  He is happy with either virtual or in person.  Signed, Phill Myron. West Pugh, ANP, AACC  12/09/2018 12:09 PM    [facility]

## 2018-12-09 NOTE — Patient Instructions (Addendum)
Medication Instructions:  Continue current Medications  If you need a refill on your cardiac medications before your next appointment, please call your pharmacy.  Labwork: None Ordered   Testing/Procedures: None Ordered  Follow-Up: .  Your physician recommends that you schedule a follow-up appointment in: 6 Months with Dr Percival Spanish    At [facility], you and your health needs are our priority.  As part of our continuing mission to provide you with exceptional heart care, we have created designated Provider Care Teams.  These Care Teams include your primary Cardiologist (physician) and Advanced Practice Providers (APPs -  Physician Assistants and Nurse Practitioners) who all work together to provide you with the care you need, when you need it.  Thank you for choosing [facility] at [facility]!!

## 2018-12-16 ENCOUNTER — Inpatient Hospital Stay: Payer: Medicare Other | Attending: Oncology

## 2018-12-16 ENCOUNTER — Inpatient Hospital Stay (HOSPITAL_BASED_OUTPATIENT_CLINIC_OR_DEPARTMENT_OTHER): Payer: Medicare Other | Admitting: Oncology

## 2018-12-16 ENCOUNTER — Other Ambulatory Visit: Payer: Self-pay

## 2018-12-16 ENCOUNTER — Other Ambulatory Visit: Payer: Medicare Other

## 2018-12-16 VITALS — BP 151/85 | HR 88 | Temp 98.7°F | Resp 17 | Ht 69.0 in | Wt 183.6 lb

## 2018-12-16 DIAGNOSIS — C9110 Chronic lymphocytic leukemia of B-cell type not having achieved remission: Secondary | ICD-10-CM

## 2018-12-16 DIAGNOSIS — Z21 Asymptomatic human immunodeficiency virus [HIV] infection status: Secondary | ICD-10-CM | POA: Insufficient documentation

## 2018-12-16 DIAGNOSIS — I25708 Atherosclerosis of coronary artery bypass graft(s), unspecified, with other forms of angina pectoris: Secondary | ICD-10-CM

## 2018-12-16 DIAGNOSIS — R0609 Other forms of dyspnea: Secondary | ICD-10-CM | POA: Diagnosis not present

## 2018-12-16 LAB — CBC WITH DIFFERENTIAL (CANCER CENTER ONLY)
Abs Immature Granulocytes: 0.05 10*3/uL (ref 0.00–0.07)
Basophils Absolute: 0 10*3/uL (ref 0.0–0.1)
Basophils Relative: 0 %
Eosinophils Absolute: 0.1 10*3/uL (ref 0.0–0.5)
Eosinophils Relative: 1 %
HCT: 40 % (ref 39.0–52.0)
Hemoglobin: 11.9 g/dL — ABNORMAL LOW (ref 13.0–17.0)
Immature Granulocytes: 1 %
Lymphocytes Relative: 50 %
Lymphs Abs: 4.4 10*3/uL — ABNORMAL HIGH (ref 0.7–4.0)
MCH: 30.4 pg (ref 26.0–34.0)
MCHC: 29.8 g/dL — ABNORMAL LOW (ref 30.0–36.0)
MCV: 102.3 fL — ABNORMAL HIGH (ref 80.0–100.0)
Monocytes Absolute: 1 10*3/uL (ref 0.1–1.0)
Monocytes Relative: 11 %
Neutro Abs: 3.3 10*3/uL (ref 1.7–7.7)
Neutrophils Relative %: 37 %
Platelet Count: 167 10*3/uL (ref 150–400)
RBC: 3.91 MIL/uL — ABNORMAL LOW (ref 4.22–5.81)
RDW: 19 % — ABNORMAL HIGH (ref 11.5–15.5)
WBC Count: 8.8 10*3/uL (ref 4.0–10.5)
nRBC: 0 % (ref 0.0–0.2)

## 2018-12-16 LAB — CMP (CANCER CENTER ONLY)
ALT: 9 U/L (ref 0–44)
AST: 12 U/L — ABNORMAL LOW (ref 15–41)
Albumin: 3 g/dL — ABNORMAL LOW (ref 3.5–5.0)
Alkaline Phosphatase: 133 U/L — ABNORMAL HIGH (ref 38–126)
Anion gap: 11 (ref 5–15)
BUN: 15 mg/dL (ref 8–23)
CO2: 25 mmol/L (ref 22–32)
Calcium: 8.6 mg/dL — ABNORMAL LOW (ref 8.9–10.3)
Chloride: 106 mmol/L (ref 98–111)
Creatinine: 0.8 mg/dL (ref 0.61–1.24)
GFR, Est AFR Am: >60 mL/min (ref >60)
GFR, Estimated: >60 mL/min (ref >60)
Glucose, Bld: 108 mg/dL — ABNORMAL HIGH (ref 70–99)
Potassium: 3.8 mmol/L (ref 3.5–5.1)
Sodium: 142 mmol/L (ref 135–145)
Total Bilirubin: 0.3 mg/dL (ref 0.3–1.2)
Total Protein: 6 g/dL — ABNORMAL LOW (ref 6.5–8.1)

## 2018-12-16 NOTE — Progress Notes (Signed)
Hematology and Oncology Follow Up Visit  [name] [id number] 07-05-1950 68 y.o. 12/16/2018 9:09 AM [name], MDJones, Christopher Right, MD   Principle Diagnosis: 68 year old man with stage 0 CLL diagnosed in 2007.  He presented with lymphocytosis and has not required any treatment since that time.     Current therapy: Active surveillance.  Interim History:  [name] is here for a follow-up visit.  Since the last visit, he reports no major changes in his health.  He was seen in the emergency department on 11/19/2018 with leg swelling and shortness of breath.  He also had a left hip surgery that required multiple intervention after that for total of 4 surgeries since April 2020.  Clinically he reports reasonably fair and ambulating with the help of a motorized scooter.  He denies any left hip pain but does report issues with mobility related to it.  Denies any fevers, chills or painful adenopathy.  He remains in reasonable health without any recent shortness of breath or difficulty breathing since he is on Lasix.  He does take potassium supplements regularly.    Patient denied any alteration mental status, neuropathy, confusion or dizziness.  Denies any headaches or lethargy.  Denies any night sweats, weight loss or changes in appetite.  Denied orthopnea, dyspnea on exertion or chest discomfort.  Denies shortness of breath, difficulty breathing hemoptysis or cough.  Denies any abdominal distention, nausea, early satiety or dyspepsia.  Denies any hematuria, frequency, dysuria or nocturia.  Denies any skin irritation, dryness or rash.  Denies any ecchymosis or petechiae.  Denies any lymphadenopathy or clotting.  Denies any heat or cold intolerance.  Denies any anxiety or depression.  Remaining review of system is negative.      Medications: Updated today and reviewed. Current Outpatient Medications  Medication Sig Dispense Refill  . acetaminophen (TYLENOL) 500 MG tablet Take 500-1,000 mg by  mouth every 6 (six) hours as needed for mild pain or moderate pain.     [name] atorvastatin (LIPITOR) 10 MG tablet Take 1 tablet (10 mg total) by mouth at bedtime. 30 tablet 11  . Brexpiprazole (REXULTI) 0.25 MG TABS Take 1 tablet by mouth daily. 90 tablet 1  . EPINEPHrine (EPIPEN 2-PAK) 0.3 mg/0.3 mL IJ SOAJ injection Inject 0.3 mg into the muscle as needed for anaphylaxis.    [name] escitalopram (LEXAPRO) 20 MG tablet Take 1 tablet (20 mg total) by mouth daily. 90 tablet 1  . famotidine (PEPCID) 40 MG tablet Take 1 tablet (40 mg total) by mouth 2 (two) times daily. 086 tablet 1  . folic acid (FOLVITE) 1 MG tablet Take 1 mg by mouth daily.  11  . GENVOYA 150-150-200-10 MG TABS tablet TAKE 1 TABLET BY MOUTH DAILY WITH BREAKFAST. 30 tablet 2  . HYDROcodone-acetaminophen (NORCO) 7.5-325 MG tablet Take 1-2 tablets by mouth every 6 (six) hours as needed for severe pain (pain score 7-10). 40 tablet 0  . leflunomide (ARAVA) 20 MG tablet Take 20 mg by mouth daily.     . metoprolol succinate (TOPROL-XL) 25 MG 24 hr tablet Take 25 mg by mouth daily.    . nitroGLYCERIN (NITROSTAT) 0.4 MG SL tablet Place 1 tablet (0.4 mg total) under the tongue every 5 (five) minutes as needed for chest pain. 25 tablet 2  . ondansetron (ZOFRAN) 4 MG tablet Take 4 mg by mouth every 6 (six) hours as needed for nausea.    . potassium chloride SA (K-DUR) 20 MEQ tablet Take 1 tablet (  20 mEq total) by mouth 3 (three) times daily. MAY TAKE ADDITIONAL TAB AS DIRECTED BY MD 280 tablet 1  . predniSONE (DELTASONE) 5 MG tablet Take 5 mg by mouth 2 (two) times daily.   1  . pregabalin (LYRICA) 200 MG capsule TAKE 1 CAPSULE BY MOUTH TWICE A DAY (Patient taking differently: Take 200 mg by mouth 2 (two) times daily. ) 180 capsule 1  . PREZISTA 800 MG tablet TAKE 1 TABLET BY MOUTH DAILY. 30 tablet 2  . SAVAYSA 30 MG TABS tablet TAKE 1 TABLET (30 MG TOTAL) BY MOUTH DAILY. 30 tablet 5  . sulfamethoxazole-trimethoprim (BACTRIM DS) 800-160 MG tablet TAKE 1  TABLET BY MOUTH EVERY 12 (TWELVE) HOURS. 60 tablet 3  . torsemide (DEMADEX) 20 MG tablet Take 2 tablets (40 mg total) by mouth daily. 40MG  X5 DAYS THEN BACK TO 20MG  DAILY 40 tablet 1  . valACYclovir (VALTREX) 500 MG tablet Take 1 tablet (500 mg total) by mouth daily. 90 tablet 3   No current facility-administered medications for this visit.      Allergies:  Allergies  Allergen Reactions  . Golimumab Anaphylaxis    Simponi ARIA  . Orencia [Abatacept] Anaphylaxis  . Other Anaphylaxis and Hives    Pecans  . Peanut-Containing Drug Products Anaphylaxis, Hives and Swelling    Swelling of throat  . Morphine Other (See Comments)    Severe headache- Can tolerate Dilaudid, however  . Oxycodone-Acetaminophen Other (See Comments)    Headache 6.22.2020 patient is currently taking and tolerating  . Penicillins Rash and Other (See Comments)    FLUSHED & RED, also Has patient had a PCN reaction causing immediate rash, facial/tongue/throat swelling, SOB or lightheadedness with hypotension: Yes Has patient had a PCN reaction causing severe rash involving mucus membranes or skin necrosis: No Has patient had a PCN reaction that required hospitalization: No Has patient had a PCN reaction occurring within the last 10 years: No If all of the above answers are "NO", then may proceed with Cephalosporin use  . Promethazine Hcl Other (See Comments)    Makes him feel "drunk" at higher strengths    Past Medical History, Surgical history, Social history, and Family History reviewed today without any changes.   Physical Exam:   Blood pressure (!) 151/85, pulse 88, temperature 98.7 F (37.1 C), temperature source Temporal, resp. rate 17, height 5\' 9"  (1.753 m), weight 183 lb 9 oz (83.3 kg), SpO2 92 %.    ECOG: 1     General appearance: Alert, awake without any distress. Head: Atraumatic without abnormalities Oropharynx: Without any thrush or ulcers. Eyes: No scleral icterus. Lymph nodes: No  lymphadenopathy noted in the cervical, supraclavicular, or axillary nodes Heart:regular rate and rhythm, without any murmurs or gallops.   Lung: Clear to auscultation without any rhonchi, wheezes or dullness to percussion. Abdomin: Soft, nontender without any shifting dullness or ascites. Musculoskeletal: No clubbing or cyanosis. Neurological: No motor or sensory deficits. Skin: No rashes or lesions. Psychiatric: Mood and affect appeared normal.     Lab Results: Lab Results  Component Value Date   WBC 9.1 11/19/2018   HGB 10.4 (L) 11/19/2018   HCT 35.4 (L) 11/19/2018   MCV 99.4 11/19/2018   PLT 163 11/19/2018     Chemistry      Component Value Date/Time   NA 139 12/08/2018 1107   NA 140 08/13/2016 0919   K 3.5 12/08/2018 1107   K 3.7 08/13/2016 0919   CL 100 12/08/2018 1107  CO2 24 12/08/2018 1107   CO2 27 08/13/2016 0919   BUN 12 12/08/2018 1107   BUN 18.7 08/13/2016 0919   CREATININE 0.93 12/08/2018 1107   CREATININE 0.83 10/28/2018 1104   CREATININE 1.1 08/13/2016 0919   GLU 92 07/09/2016      Component Value Date/Time   CALCIUM 8.0 (L) 12/08/2018 1107   CALCIUM 8.8 08/13/2016 0919   ALKPHOS 115 11/16/2018 1526   ALKPHOS 114 08/13/2016 0919   AST 15 11/16/2018 1526   AST 26 05/13/2018 0837   AST 21 08/13/2016 0919   ALT 11 11/16/2018 1526   ALT 33 05/13/2018 0837   ALT 18 08/13/2016 0919   BILITOT 0.6 11/16/2018 1526   BILITOT 0.6 05/13/2018 0837   BILITOT 0.59 08/13/2016 0919        Impression and Plan:  68 year old man with  1.  Stage is 0 CLL with CD38 positive, ZAP 70 positive after presenting with lymphocytosis and no adenopathy in 2007.  The natural course of this disease was updated today.  Laboratory data from 11/19/2018 showed a normal white cell count and mild lymphocytosis.  Indication for treatment for this disease were reiterated.  These would include symptomatic lymphadenopathy, rapid rise in his lymphocytosis or marrow failure.   At  this time, I see no indication to treat his CLL and I recommended continued active surveillance.   2.  Dyspnea on exertion: Appears to be chronic in nature and manageable at this time.  3.  HIV: Managed currently by Dr. [name] without any recent issues or exacerbations.  4. Follow-up: 8 months to follow his progress.  15  minutes was spent with the patient face-to-face today.  More than 50% of time was dedicated to reviewing his disease status, treatment options and indication.   Zola Button, MD 8/6/20209:09 AM

## 2018-12-20 ENCOUNTER — Telehealth: Payer: Self-pay | Admitting: Oncology

## 2018-12-20 NOTE — Telephone Encounter (Signed)
Called and left msg. Mailed printout  °

## 2018-12-27 ENCOUNTER — Encounter: Payer: Self-pay | Admitting: Internal Medicine

## 2018-12-28 ENCOUNTER — Ambulatory Visit
Admission: RE | Admit: 2018-12-28 | Discharge: 2018-12-28 | Disposition: A | Discharge status: Home / Self Care | Payer: Medicare Other | Source: Ambulatory Visit | Attending: Neurosurgery | Admitting: Neurosurgery

## 2018-12-28 ENCOUNTER — Other Ambulatory Visit: Payer: Self-pay

## 2018-12-28 ENCOUNTER — Other Ambulatory Visit: Payer: Self-pay | Admitting: Neurosurgery

## 2018-12-28 DIAGNOSIS — M4120 Other idiopathic scoliosis, site unspecified: Secondary | ICD-10-CM

## 2018-12-28 DIAGNOSIS — M5136 Other intervertebral disc degeneration, lumbar region: Secondary | ICD-10-CM

## 2018-12-28 DIAGNOSIS — M4326 Fusion of spine, lumbar region: Secondary | ICD-10-CM | POA: Diagnosis not present

## 2018-12-28 DIAGNOSIS — M5137 Other intervertebral disc degeneration, lumbosacral region: Secondary | ICD-10-CM | POA: Diagnosis not present

## 2018-12-28 DIAGNOSIS — M5134 Other intervertebral disc degeneration, thoracic region: Secondary | ICD-10-CM | POA: Diagnosis not present

## 2018-12-30 ENCOUNTER — Other Ambulatory Visit: Payer: Self-pay

## 2018-12-30 ENCOUNTER — Encounter: Payer: Self-pay | Admitting: Internal Medicine

## 2018-12-30 ENCOUNTER — Ambulatory Visit (INDEPENDENT_AMBULATORY_CARE_PROVIDER_SITE_OTHER): Payer: Medicare Other | Admitting: Internal Medicine

## 2018-12-30 VITALS — BP 144/70 | HR 76 | Temp 97.9°F | Resp 16 | Ht 69.0 in | Wt 187.0 lb

## 2018-12-30 DIAGNOSIS — Z23 Encounter for immunization: Secondary | ICD-10-CM

## 2018-12-30 DIAGNOSIS — B356 Tinea cruris: Secondary | ICD-10-CM

## 2018-12-30 DIAGNOSIS — G629 Polyneuropathy, unspecified: Secondary | ICD-10-CM | POA: Diagnosis not present

## 2018-12-30 DIAGNOSIS — I25708 Atherosclerosis of coronary artery bypass graft(s), unspecified, with other forms of angina pectoris: Secondary | ICD-10-CM

## 2018-12-30 MED ORDER — FLUCONAZOLE 200 MG PO TABS: 200.0000 mg | ORAL_TABLET | ORAL | 0 refills | Status: AC

## 2018-12-30 NOTE — Progress Notes (Signed)
Subjective:  Patient ID: [name], male    DOB: [birth year]  Age: 68 y.o. MRN: [id number]  CC: Rash   HPI [name] presents for concerns about a rash in his groin.  He says the rash extends from his left upper groin down into the pubic area and onto his upper thighs.  It sounds like he is treating it with 2% terbinafine topically and has not had much improvement.  Outpatient Medications Prior to Visit  Medication Sig Dispense Refill  . acetaminophen (TYLENOL) 500 MG tablet Take 500-1,000 mg by mouth every 6 (six) hours as needed for mild pain or moderate pain.     [name] atorvastatin (LIPITOR) 10 MG tablet Take 1 tablet (10 mg total) by mouth at bedtime. 30 tablet 11  . Brexpiprazole (REXULTI) 0.25 MG TABS Take 1 tablet by mouth daily. 90 tablet 1  . EPINEPHrine (EPIPEN 2-PAK) 0.3 mg/0.3 mL IJ SOAJ injection Inject 0.3 mg into the muscle as needed for anaphylaxis.    [name] escitalopram (LEXAPRO) 20 MG tablet Take 1 tablet (20 mg total) by mouth daily. 90 tablet 1  . famotidine (PEPCID) 40 MG tablet Take 1 tablet (40 mg total) by mouth 2 (two) times daily. 180 tablet 1  . folic acid (FOLVITE) 1 MG tablet Take 1 mg by mouth daily.  11  . GENVOYA 150-150-200-10 MG TABS tablet TAKE 1 TABLET BY MOUTH DAILY WITH BREAKFAST. 30 tablet 2  . HYDROcodone-acetaminophen (NORCO) 7.5-325 MG tablet Take 1-2 tablets by mouth every 6 (six) hours as needed for severe pain (pain score 7-10). 40 tablet 0  . leflunomide (ARAVA) 20 MG tablet Take 20 mg by mouth daily.     . metoprolol succinate (TOPROL-XL) 25 MG 24 hr tablet Take 25 mg by mouth daily.    . nitroGLYCERIN (NITROSTAT) 0.4 MG SL tablet Place 1 tablet (0.4 mg total) under the tongue every 5 (five) minutes as needed for chest pain. 25 tablet 2  . ondansetron (ZOFRAN) 4 MG tablet Take 4 mg by mouth every 6 (six) hours as needed for nausea.    . potassium chloride SA (K-DUR) 20 MEQ tablet Take 1 tablet (20 mEq total) by mouth 3 (three) times daily.  MAY TAKE ADDITIONAL TAB AS DIRECTED BY MD 280 tablet 1  . predniSONE (DELTASONE) 5 MG tablet Take 5 mg by mouth 2 (two) times daily.   1  . pregabalin (LYRICA) 200 MG capsule TAKE 1 CAPSULE BY MOUTH TWICE A DAY (Patient taking differently: Take 200 mg by mouth 2 (two) times daily. ) 180 capsule 1  . PREZISTA 800 MG tablet TAKE 1 TABLET BY MOUTH DAILY. 30 tablet 2  . SAVAYSA 30 MG TABS tablet TAKE 1 TABLET (30 MG TOTAL) BY MOUTH DAILY. 30 tablet 5  . sulfamethoxazole-trimethoprim (BACTRIM DS) 800-160 MG tablet TAKE 1 TABLET BY MOUTH EVERY 12 (TWELVE) HOURS. 60 tablet 3  . torsemide (DEMADEX) 20 MG tablet Take 2 tablets (40 mg total) by mouth daily. 40MG  X5 DAYS THEN BACK TO 20MG  DAILY 40 tablet 1  . valACYclovir (VALTREX) 500 MG tablet Take 1 tablet (500 mg total) by mouth daily. 90 tablet 3   No facility-administered medications prior to visit.     ROS Review of Systems  Constitutional: Negative for diaphoresis and fatigue.  HENT: Negative.   Eyes: Negative for visual disturbance.  Respiratory: Negative.  Negative for cough, chest tightness, shortness of breath and wheezing.   Cardiovascular: Negative for chest pain, palpitations and  leg swelling.  Gastrointestinal: Negative for abdominal pain, constipation, diarrhea, nausea and vomiting.  Endocrine: Negative.   Genitourinary: Negative.   Musculoskeletal: Positive for back pain.  Skin: Positive for rash.  Neurological: Negative.  Negative for dizziness, weakness and light-headedness.  Hematological: Negative for adenopathy. Does not bruise/bleed easily.  Psychiatric/Behavioral: Negative.     Objective:  BP (!) 144/70 (BP Location: Left Arm, Patient Position: Sitting, Cuff Size: Normal)   Pulse 76   Temp 97.9 F (36.6 C) (Oral)   Resp 16   Ht 5\' 9"  (1.753 m)   Wt 187 lb (84.8 kg)   SpO2 90%   BMI 27.62 kg/m   BP Readings from Last 3 Encounters:  12/30/18 (!) 144/70  12/16/18 (!) 151/85  12/09/18 134/75    Wt Readings  from Last 3 Encounters:  12/30/18 187 lb (84.8 kg)  12/16/18 183 lb 9 oz (83.3 kg)  12/09/18 179 lb 8 oz (81.4 kg)    Physical Exam Vitals signs reviewed.  Eyes:     General: No scleral icterus.    Conjunctiva/sclera: Conjunctivae normal.  Neck:     Musculoskeletal: Normal range of motion. No neck rigidity or muscular tenderness.  Cardiovascular:     Rate and Rhythm: Normal rate and regular rhythm.     Heart sounds: Murmur present.  Pulmonary:     Breath sounds: No stridor. No wheezing, rhonchi or rales.  Abdominal:     General: Abdomen is flat and protuberant. Bowel sounds are normal. There is no distension.     Palpations: There is no hepatomegaly or splenomegaly.     Tenderness: There is no abdominal tenderness.  Musculoskeletal: Normal range of motion.     Right lower leg: No edema.     Left lower leg: No edema.  Skin:    General: Skin is warm and dry.     Findings: Rash present.     Comments: There are large swaths of scaly, hyperpigmented, faintly papular areas that extend from the left groin down onto the scrotum.  There are a few hyperpigmented satellite lesions on the internal aspect of the upper thighs. See photo.  Neurological:     General: No focal deficit present.     Mental Status: He is alert.     Lab Results  Component Value Date   WBC 8.8 12/16/2018   HGB 11.9 (L) 12/16/2018   HCT 40.0 12/16/2018   PLT 167 12/16/2018   GLUCOSE 108 (H) 12/16/2018   CHOL 162 10/28/2018   TRIG 152 (H) 10/28/2018   HDL 35 (L) 10/28/2018   LDLCALC 102 (H) 10/28/2018   ALT 9 12/16/2018   AST 12 (L) 12/16/2018   NA 142 12/16/2018   K 3.8 12/16/2018   CL 106 12/16/2018   CREATININE 0.80 12/16/2018   BUN 15 12/16/2018   CO2 25 12/16/2018   TSH 1.29 11/16/2018   INR 1.61 11/19/2017   HGBA1C 5.1 11/16/2018   MICROALBUR 2.6 (H) 11/16/2018    Ct Thoracic Spine Wo Contrast  Result Date: 12/28/2018 CLINICAL DATA:  Bilateral lower extremity numbness, tingling and  weakness for 2 weeks. History of prior surgery x3. EXAM: CT THORACIC SPINE WITHOUT CONTRAST TECHNIQUE: Multidetector CT images of the thoracic were obtained using the standard protocol without intravenous contrast. COMPARISON:  MRI thoracic spine 10/18/2016. CT thoracic spine 10/15/2011. CT abdomen and pelvis 09/26/2018. FINDINGS: Alignment: Straightening of lordosis noted. Vertebrae: No acute fracture or focal pathologic process. Spinal fusion hardware extends from T11 into the lumbar  spine. Pedicle screws are well positioned without evidence of loosening. Stabilization bars are intact. There is coalescence of bone graft material about the posterior elements. Bridging ossification of the anterior longitudinal ligament from T9 into the lumbar spine consistent with DISH noted. Paraspinal and other soft tissues: The patient is status post CABG. Calcified mediastinal and hilar lymph nodes are identified. Calcified right upper lobe granuloma noted. Mild dependent atelectasis. Disc levels: C6-7: Marked loss of disc space height but the central canal and foramina appear open. C7-T1: Trace anterolisthesis due to facet arthropathy. No evidence of stenosis. T1-2: Trace anterolisthesis due to facet arthropathy without stenosis. T2-3 to T5-6 are negative. T6-7: Minimal vacuum disc phenomenon mild facet degenerative change without stenosis. T7-8: Minimal vacuum disc phenomenon and mild-to-moderate facet arthropathy without stenosis. T8-9: Vacuum disc and facet arthropathy without stenosis. T9-10: Negative. T10-11: Negative. T11-12: Negative. T12-L1: Negative.  Status post laminectomy.  No stenosis. IMPRESSION: Fusion hardware at T11 extends into the lumbar spine. Hardware is intact and well positioned without evidence of loosening or other complicating feature. There is coalescence of bone graft material about the posterior elements. Mild appearing degenerative disc disease without stenosis. DISH in the lower thoracic spine.  Electronically Signed   By: Drusilla Kanner M.D.   On: 12/28/2018 20:02   Ct Lumbar Spine Wo Contrast  Result Date: 12/28/2018 CLINICAL DATA:  History spinal fusion. Bilateral lower extremity numbness, tingling and weakness for 2 weeks. EXAM: CT LUMBAR SPINE WITHOUT CONTRAST TECHNIQUE: Multidetector CT imaging of the lumbar spine was performed without intravenous contrast administration. Multiplanar CT image reconstructions were also generated. COMPARISON:  CT lumbar spine 05/13/2016. MRI lumbar spine 05/14/2016. FINDINGS: Segmentation: Standard. Alignment: There is severe convex left scoliosis with the apex at L3. Vertebrae: No fracture or worrisome lesion. Spinal fusion hardware extends from above the margin of the scan to the sacrum. Hardware is intact without evidence of loosening. Solid posterior fusion mass is identified. There is also bridging ossification across the L1-L4 disc interspaces and the L5-S1 disc interspace. The patient's right S1 screw traverses the subarticular recess, unchanged. Minimal vacuum disc phenomenon is seen anteriorly at L4-5. Paraspinal and other soft tissues: Atherosclerosis is noted. Disc levels: T12-L1: Status post laminectomy. The central canal and foramina appear open. L1-2: Status post laminectomy.  No stenosis. L2-3: Status post laminectomy.  No stenosis. L3-4: Status post laminectomy.  No stenosis. L4-5: Status post laminectomy.  No stenosis. L5-S1: Status post laminectomy.  No stenosis. IMPRESSION: Status post fusion from the lower thoracic spine into the sacrum. The central canal and foramina are open at all levels and hardware is intact and unchanged. Mild vacuum disc phenomenon at L4-5 is new since the prior CT but solid posterior fusion mass is present at all levels including L4-5. Electronically Signed   By: Drusilla Kanner M.D.   On: 12/28/2018 20:11      Assessment & Plan:   Owain was seen today for rash.  Diagnoses and all orders for this  visit:  Tinea cruris -     fluconazole (DIFLUCAN) 200 MG tablet; Take 1 tablet (200 mg total) by mouth once a week.  Need for influenza vaccination -     Flu Vaccine QUAD High Dose(Fluad)   I am having [name] "Phil" start on fluconazole. I am also having him maintain his predniSONE, folic acid, nitroGLYCERIN, valACYclovir, atorvastatin, leflunomide, pregabalin, Savaysa, acetaminophen, EPINEPHrine, ondansetron, Prezista, Genvoya, famotidine, Rexulti, escitalopram, metoprolol succinate, HYDROcodone-acetaminophen, sulfamethoxazole-trimethoprim, torsemide, and potassium chloride SA.  Meds ordered  this encounter  Medications  . fluconazole (DIFLUCAN) 200 MG tablet    Sig: Take 1 tablet (200 mg total) by mouth once a week.    Dispense:  4 tablet    Refill:  0     Follow-up: Return in about 3 months (around 04/01/2019).  Sanda Linger, MD

## 2018-12-30 NOTE — Patient Instructions (Signed)
Jock Itch Jock itch (tinea cruris) is an infection of the skin in the groin area. It is caused by a fungus, which is a type of germ that lives in dark, damp places. Jock itch causes an itchy rash in the groin and upper thigh area. It usually goes away in 2-3 weeks with treatment. What are the causes? The fungus that causes jock itch may be spread by:  Touching a fungus infection elsewhere on your body, such as athlete's foot, and then touching your groin area.  Sharing towels or clothing, such as socks or shoes, with someone who has a fungal infection. What increases the risk? Jock itch is most common in men and adolescent boys. You are also more likely to develop the condition if you:  Are in a hot, humid climate.  Wear tight-fitting clothing or wet bathing suits for long periods of time.  Play sports.  Are overweight.  Have diabetes.  Have a weakened immune system.  Sweat a lot. What are the signs or symptoms? Symptoms of jock itch may include:  A red, pink, or brown rash in the groin area. Blisters may be present. The rash may spread to the thighs, anus, and buttocks.  Dry and scaly skin on or around the rash.  Itchiness. How is this diagnosed? In most cases, your health care provider can make the diagnosis by looking at your rash. In some cases, a sample of infected skin may be scraped off. This sample may be examined under a microscope (biopsy) or by trying to grow the fungus from the sample (culture). How is this treated? Treatment for this condition may include:  Antifungal medicine to kill the fungus. This may be a skin cream, ointment, or powder, or it may be a medicine that you take by mouth.  Skin cream or ointment to reduce itching.  Lifestyle changes, such as wearing looser clothing and caring for your skin. Follow these instructions at home: Skin care  Apply skin creams, ointments, or powders exactly as told by your health care provider.  Wear  loose-fitting clothing that does not rub against your groin area. Men should wear boxer shorts or loose-fitting underwear.  Keep your groin area clean and dry. ? Change your underwear every day. ? Change out of wet bathing suits as soon as possible. ? After bathing, use a separate towel to dry your groin area thoroughly and gently. Using a separate towel will help prevent spreading the infection to other areas of your body.  Avoid hot baths and showers. Hot water can make itching worse.  Do not scratch the affected area. General instructions  Take and apply over-the-counter and prescription medicines only as told by your health care provider.  Do not share towels, clothing, or personal items with other people.  Wash your hands often with soap and water, especially after touching your groin area. If soap and water are not available, use alcohol-based hand sanitizer. Contact a health care provider if:  Your rash: ? Gets worse or does not get better after 2 weeks of treatment. ? Spreads. ? Returns after treatment is finished.  You have any of the following: ? A fever. ? New or worsening redness, swelling, or pain around your rash. ? Fluid, blood, or pus coming from your rash. Summary  Jock itch (tinea cruris) is a fungal infection of the skin in the groin area.  The fungus can be spread by sharing clothing or by touching a fungus infection elsewhere on your body and   then touching your groin area.  Treatment may include antifungal medicine and lifestyle changes, such as keeping the area clean and dry. This information is not intended to replace advice given to you by your health care provider. Make sure you discuss any questions you have with your health care provider. Document Released: 04/18/2002 Document Revised: 04/10/2017 Document Reviewed: 04/08/2017 Elsevier Patient Education  2020 [employer].

## 2019-01-06 ENCOUNTER — Encounter: Payer: Self-pay | Admitting: Orthopaedic Surgery

## 2019-01-06 ENCOUNTER — Ambulatory Visit (INDEPENDENT_AMBULATORY_CARE_PROVIDER_SITE_OTHER): Payer: Medicare Other | Admitting: Orthopaedic Surgery

## 2019-01-06 ENCOUNTER — Ambulatory Visit (INDEPENDENT_AMBULATORY_CARE_PROVIDER_SITE_OTHER): Payer: Medicare Other

## 2019-01-06 DIAGNOSIS — Z96642 Presence of left artificial hip joint: Secondary | ICD-10-CM | POA: Diagnosis not present

## 2019-01-06 NOTE — Progress Notes (Signed)
The patient is now 8 weeks status post multiple hip surgeries involving his left hip.  He had several dislocations after surgery.  His hip is very difficult to replace secondary to the severe kyphosis of his spine and his fixed pelvis position.  At his last surgery 8 weeks ago I placed a constrained liner.  He has not had any dislocations since then.  He has seen his neurosurgeon recently.  He is now using a scooter to get around with because he has profound neuropathy in both his legs.  He has seen his infectious disease specialist about this as well as neurosurgery.  He is been referred to neurology.  He does report some left hip pain with weightbearing.  On exam his hip clinically is well located.  He says he has not had any episodes where he feels like his skin to come out of place.  His incision has healed nicely and there is no evidence of infection.  An AP pelvis and lateral of the left hip today shows no complicating features with the implant itself.  It is still located and the locking ring liner is intact.  From my standpoint I do not need to see him back for 3 months.  At that visit I would like a standing AP pelvis view for a single view only.  Obviously if he has any problem or dislocation before then there should be no attempt to reduce it under sedation in the ER or in the operating room because the constrained liner would have broken we would need to do more extensive surgery.  He understands this as well.

## 2019-01-07 DIAGNOSIS — G629 Polyneuropathy, unspecified: Secondary | ICD-10-CM | POA: Diagnosis not present

## 2019-01-10 ENCOUNTER — Other Ambulatory Visit: Payer: Self-pay

## 2019-01-10 MED ORDER — METOPROLOL SUCCINATE ER 25 MG PO TB24: 25.0000 mg | ORAL_TABLET | Freq: Every day | ORAL | 1 refills | Status: DC

## 2019-01-10 NOTE — Telephone Encounter (Signed)
Refill

## 2019-01-13 DIAGNOSIS — M545 Low back pain: Secondary | ICD-10-CM | POA: Diagnosis not present

## 2019-01-13 DIAGNOSIS — M5137 Other intervertebral disc degeneration, lumbosacral region: Secondary | ICD-10-CM | POA: Diagnosis not present

## 2019-01-13 DIAGNOSIS — M5134 Other intervertebral disc degeneration, thoracic region: Secondary | ICD-10-CM | POA: Diagnosis not present

## 2019-01-18 DIAGNOSIS — Z20828 Contact with and (suspected) exposure to other viral communicable diseases: Secondary | ICD-10-CM | POA: Diagnosis not present

## 2019-01-25 DIAGNOSIS — G629 Polyneuropathy, unspecified: Secondary | ICD-10-CM | POA: Diagnosis not present

## 2019-01-25 DIAGNOSIS — M4120 Other idiopathic scoliosis, site unspecified: Secondary | ICD-10-CM | POA: Diagnosis not present

## 2019-01-27 ENCOUNTER — Other Ambulatory Visit: Payer: Self-pay | Admitting: Neurosurgery

## 2019-01-27 DIAGNOSIS — M4120 Other idiopathic scoliosis, site unspecified: Secondary | ICD-10-CM

## 2019-01-31 ENCOUNTER — Telehealth: Payer: Self-pay | Admitting: *Deleted

## 2019-01-31 ENCOUNTER — Ambulatory Visit: Payer: Medicare Other | Admitting: Cardiology

## 2019-01-31 DIAGNOSIS — Z5181 Encounter for therapeutic drug level monitoring: Secondary | ICD-10-CM | POA: Diagnosis not present

## 2019-01-31 LAB — BASIC METABOLIC PANEL
BUN/Creatinine Ratio: 18 (ref 10–24)
BUN: 15 mg/dL (ref 8–27)
CO2: 25 mmol/L (ref 20–29)
Calcium: 8.7 mg/dL (ref 8.6–10.2)
Chloride: 101 mmol/L (ref 96–106)
Creatinine, Ser: 0.83 mg/dL (ref 0.76–1.27)
GFR calc Af Amer: 103 mL/min/{1.73_m2} (ref >59)
GFR calc non Af Amer: 89 mL/min/{1.73_m2} (ref >59)
Glucose: 108 mg/dL — ABNORMAL HIGH (ref 65–99)
Potassium: 3.6 mmol/L (ref 3.5–5.2)
Sodium: 140 mmol/L (ref 134–144)

## 2019-01-31 NOTE — Telephone Encounter (Signed)
Patient came for visit today with Dr Percival Spanish. Today's appointment had been cancelled when he saw Arnold Long DNP a few months ago. Patient is concerned about his potassium since he takes so many and requested that be checked today. Ordered BMET

## 2019-02-02 ENCOUNTER — Other Ambulatory Visit: Payer: Self-pay | Admitting: Internal Medicine

## 2019-02-02 DIAGNOSIS — I82403 Acute embolism and thrombosis of unspecified deep veins of lower extremity, bilateral: Secondary | ICD-10-CM

## 2019-02-03 DIAGNOSIS — R29818 Other symptoms and signs involving the nervous system: Secondary | ICD-10-CM | POA: Diagnosis not present

## 2019-02-03 DIAGNOSIS — M1A09X0 Idiopathic chronic gout, multiple sites, without tophus (tophi): Secondary | ICD-10-CM | POA: Diagnosis not present

## 2019-02-03 DIAGNOSIS — J849 Interstitial pulmonary disease, unspecified: Secondary | ICD-10-CM | POA: Diagnosis not present

## 2019-02-03 DIAGNOSIS — E663 Overweight: Secondary | ICD-10-CM | POA: Diagnosis not present

## 2019-02-03 DIAGNOSIS — B59 Pneumocystosis: Secondary | ICD-10-CM | POA: Diagnosis not present

## 2019-02-03 DIAGNOSIS — M0609 Rheumatoid arthritis without rheumatoid factor, multiple sites: Secondary | ICD-10-CM | POA: Diagnosis not present

## 2019-02-03 DIAGNOSIS — M2550 Pain in unspecified joint: Secondary | ICD-10-CM | POA: Diagnosis not present

## 2019-02-03 DIAGNOSIS — C9110 Chronic lymphocytic leukemia of B-cell type not having achieved remission: Secondary | ICD-10-CM | POA: Diagnosis not present

## 2019-02-03 DIAGNOSIS — R110 Nausea: Secondary | ICD-10-CM | POA: Diagnosis not present

## 2019-02-03 DIAGNOSIS — Z21 Asymptomatic human immunodeficiency virus [HIV] infection status: Secondary | ICD-10-CM | POA: Diagnosis not present

## 2019-02-03 DIAGNOSIS — M150 Primary generalized (osteo)arthritis: Secondary | ICD-10-CM | POA: Diagnosis not present

## 2019-02-03 DIAGNOSIS — Z6829 Body mass index (BMI) 29.0-29.9, adult: Secondary | ICD-10-CM | POA: Diagnosis not present

## 2019-02-04 ENCOUNTER — Ambulatory Visit
Admission: RE | Admit: 2019-02-04 | Discharge: 2019-02-04 | Disposition: A | Discharge status: Home / Self Care | Payer: Medicare Other | Source: Ambulatory Visit | Attending: Neurosurgery | Admitting: Neurosurgery

## 2019-02-04 ENCOUNTER — Other Ambulatory Visit: Payer: Self-pay

## 2019-02-04 DIAGNOSIS — M5134 Other intervertebral disc degeneration, thoracic region: Secondary | ICD-10-CM | POA: Diagnosis not present

## 2019-02-04 DIAGNOSIS — M4120 Other idiopathic scoliosis, site unspecified: Secondary | ICD-10-CM

## 2019-02-04 DIAGNOSIS — M4325 Fusion of spine, thoracolumbar region: Secondary | ICD-10-CM | POA: Diagnosis not present

## 2019-02-05 ENCOUNTER — Other Ambulatory Visit: Payer: Self-pay | Admitting: Infectious Diseases

## 2019-02-05 DIAGNOSIS — A6000 Herpesviral infection of urogenital system, unspecified: Secondary | ICD-10-CM

## 2019-02-08 ENCOUNTER — Other Ambulatory Visit: Payer: Self-pay | Admitting: Internal Medicine

## 2019-02-08 DIAGNOSIS — M792 Neuralgia and neuritis, unspecified: Secondary | ICD-10-CM

## 2019-02-17 DIAGNOSIS — G629 Polyneuropathy, unspecified: Secondary | ICD-10-CM | POA: Diagnosis not present

## 2019-02-17 DIAGNOSIS — M4120 Other idiopathic scoliosis, site unspecified: Secondary | ICD-10-CM | POA: Diagnosis not present

## 2019-02-17 DIAGNOSIS — Z6827 Body mass index (BMI) 27.0-27.9, adult: Secondary | ICD-10-CM | POA: Diagnosis not present

## 2019-02-21 NOTE — Progress Notes (Deleted)
NEUROLOGY FOLLOW UP OFFICE NOTE  [name] [id number]  HISTORY OF PRESENT ILLNESS: [name] is a 68 year old right-handed Caucasian man with rheumatoid arthritis, HIV, CLL, peripheral neuropathy and history of TIA, recurrent DVT and lumbar spine surgery who follows up for peripheral neuropathy.  UPDATE: Last seen in March.  At that time, worsening weakness and gait abnormality thought to be sue to deconditioning, as he had been sedentary and not participating in routine exercise.  He had recurrent dislocation of the left hip and underwent another surgery in June.  ***.  Repeat X-rays of left hip and pelvis showed total hip arthroplasty in place.  He then started endorsing bilateral leg numbness, tingling and weakness.  He was evaluated by neurosurgery to re-evaluate hardware from prior thoracolumbar fusion.  CT thoracic and lumbar spine from 12/28/2018 showed kyphoscoliosis, status post laminectomy with stable bilateral pedicle screw and rod fusion from T11 through the sacrum but no acute abnormality except for minimal vacuum disc phenomenon at L4-5.  Repeat study from 02/04/2019 was unchanged.  Labs since last visit include Hgb A1c 5.1, B12 481 but did demonstrate iron deficiency.  He had received iron infusion.  HISTORY: Over the years, he has endorsed multiple symptomology.  He has rheumatoid arthritis and lumbar spine disease, status post surgery.  He also has longstanding peripheral neuropathy.  He has chronic pain.  He also has known depression and anxiety.  He has history of kyphoscoliosis, arthritis of the lumbar spine and hip and has undergone thoracolumbar fusion and multiple left hip arthroplasty.    He has history of baseline left sided weakness. He came and a neurologist  for several issues. He had difficulty swallowing, however swallowing studies have not showed any mechanical problems. He was also concerned about his memory, as he was often losing his train of thought.  Another issue was is balance and gait. Dr. Smiley Houseman suspected memory problems were multifactorial, and balance problems were due to worsening arthritis and sequela of lumbar surgery. MRI of brain was performed on 07/14/12 to rule out a new stroke, which revealed mild atrophy and white matter changes, but no acute or subacute infarcts.   He also has had episodes where he would suddenly have a sense of shutting down, like "a motor running and then switched off." This is accompanied by a feeling of impending doom. It usually lasts a couple of seconds but recently it occurred while in the car and lasted longer, causing him to pull over. There is no loss of consciousness or headache. He also continues to stumble, although he has not had any falls. He reports excessive drooling.   Regarding memory, it has been a progressive problem over several years. He reports that he had an outside neuropsychological test several years ago at [facility]. He said that he was told he was slow to respond, but doesn't know the exact results of the test. He reports memory problems as well as processing information. He now has difficulty performing crossword puzzles. He forgets appointments and once drove to the wrong clinic for an appointment. If he is going out to the mailbox and gets side-tracked, he will then forget to go to the mailbox. He also has forgotten the name of his dog. He calls his car "Malena Catholic" and has at times forgotten that name. When he writes a check, he will misprint the date anywhere from the 1950s to the present. He has not gotten lost while driving. He lives alone and is able  to perform all his ADLs.  Neuropsychological testing was performed in September 2014  and did not reveal any organic cognitive impairment, and reduced speed of processing and attentional capacity more likely related to anxiety and depression.  He reports worsening balance over the past year.  He reports increased stumbling and  falling.  He states his legs cramp and feel weak and numb.  He is concerned that he may have ALS.  He endorses mental fog.  He endorses worsening diffuse joint pain.  He had an MRI of the left hip in January 2020 which demonstrated severe arthritis.  He also endorsed diffuse weakness and blurred or double vision.  He previously had thiamine deficiency but recent B1 level was 112.  He also reports double vision or blurred vision when watching TV.  He was evaluated by Dr. Dione Booze of ophthalmology who reportedly did not find anything significant.  He states food gets caught and saliva often goes "down the wrong pipe."  Thyroid panel and Myasthenia panel were normal.  MRI of brain and orbits with and without contrast from 07/17/18 were personally reviewed and were normal.  Serum glucose has been normal.  He last had physical therapy over a year ago.   For pain, he takes Lyrica, Tylenol #3.  He takes Lexapro for depression and anxiety.  He takes atorvastatin for cholesterol.  PAST MEDICAL HISTORY: Past Medical History:  Diagnosis Date  . Allergy   . Anxiety   . Carotid artery occlusion    40-60% right ICA stenosis (09/2008)  . Cataract   . Chronic back pain   . CLL (chronic lymphoblastic leukemia) dx 2010   Followed at [facility] q33[facility], no current therapy   . Clotting disorder (HCC)   . Coronary artery disease 2010   s/p CABG '10, sees Dr. Antoine Poche  . Depression   . Diverticulosis   . DVT, lower extremity, recurrent (HCC) 2008, 2009   LLE, chronic anticoag since 2009  . Esophagitis   . Fibromyalgia   . Gallstones   . GERD (gastroesophageal reflux disease)   . Gout   . Gynecomastia, male   . H/O hiatal hernia 2008   surgery  . Hemorrhoids   . Hepatitis A yrs ago  . HIV infection (HCC) dx 1993  . Hypertension   . Impotence of organic origin   . Myocardial infarction (HCC) 2010    x 2  . Neuromuscular disorder (HCC)    neuropathy  . Osteoarthritis, knee    s/p B TKA  . Osteoporosis   .  Pneumonia mrach, may, july 2017  . Rheumatoid arthritis(714.0) dx 2010   MTX, follows with rheum  . Seasonal allergies   . Secondary syphilis 07/24/14 dx   s/p 2 wks doxy  . Status post dilation of esophageal narrowing   . Stroke (HCC) 1997   tia   . TIA (transient ischemic attack) 1997   mild residual L mouth droop  . Tubular adenoma of colon     MEDICATIONS: Current Outpatient Medications on File Prior to Visit  Medication Sig Dispense Refill  . acetaminophen (TYLENOL) 500 MG tablet Take 500-1,000 mg by mouth every 6 (six) hours as needed for mild pain or moderate pain.     [name] atorvastatin (LIPITOR) 10 MG tablet Take 1 tablet (10 mg total) by mouth at bedtime. 30 tablet 11  . Brexpiprazole (REXULTI) 0.25 MG TABS Take 1 tablet by mouth daily. 90 tablet 1  . EPINEPHrine (EPIPEN 2-PAK) 0.3 mg/0.3 mL IJ SOAJ  injection Inject 0.3 mg into the muscle as needed for anaphylaxis.    [name] escitalopram (LEXAPRO) 20 MG tablet Take 1 tablet (20 mg total) by mouth daily. 90 tablet 1  . famotidine (PEPCID) 40 MG tablet Take 1 tablet (40 mg total) by mouth 2 (two) times daily. 180 tablet 1  . folic acid (FOLVITE) 1 MG tablet Take 1 mg by mouth daily.  11  . GENVOYA 150-150-200-10 MG TABS tablet TAKE 1 TABLET BY MOUTH DAILY WITH BREAKFAST. 30 tablet 2  . HYDROcodone-acetaminophen (NORCO) 7.5-325 MG tablet Take 1-2 tablets by mouth every 6 (six) hours as needed for severe pain (pain score 7-10). 40 tablet 0  . leflunomide (ARAVA) 20 MG tablet Take 20 mg by mouth daily.     . metoprolol succinate (TOPROL-XL) 25 MG 24 hr tablet Take 1 tablet (25 mg total) by mouth daily. 90 tablet 1  . nitroGLYCERIN (NITROSTAT) 0.4 MG SL tablet Place 1 tablet (0.4 mg total) under the tongue every 5 (five) minutes as needed for chest pain. 25 tablet 2  . ondansetron (ZOFRAN) 4 MG tablet Take 4 mg by mouth every 6 (six) hours as needed for nausea.    . potassium chloride SA (K-DUR) 20 MEQ tablet Take 1 tablet (20 mEq total) by  mouth 3 (three) times daily. MAY TAKE ADDITIONAL TAB AS DIRECTED BY MD 280 tablet 1  . predniSONE (DELTASONE) 5 MG tablet Take 5 mg by mouth 2 (two) times daily.   1  . pregabalin (LYRICA) 200 MG capsule TAKE 1 CAPSULE BY MOUTH TWICE A DAY 60 capsule 5  . PREZISTA 800 MG tablet TAKE 1 TABLET BY MOUTH DAILY. 30 tablet 2  . SAVAYSA 30 MG TABS tablet TAKE 1 TABLET (30 MG TOTAL) BY MOUTH DAILY. 30 tablet 5  . sulfamethoxazole-trimethoprim (BACTRIM DS) 800-160 MG tablet TAKE 1 TABLET BY MOUTH EVERY 12 (TWELVE) HOURS. 60 tablet 3  . torsemide (DEMADEX) 20 MG tablet Take 2 tablets (40 mg total) by mouth daily. 40MG  X5 DAYS THEN BACK TO 20MG  DAILY 40 tablet 1  . valACYclovir (VALTREX) 500 MG tablet TAKE 1 TABLET BY MOUTH EVERY DAY 30 tablet 2   No current facility-administered medications on file prior to visit.     ALLERGIES: Allergies  Allergen Reactions  . Golimumab Anaphylaxis    Simponi ARIA  . Orencia [Abatacept] Anaphylaxis  . Other Anaphylaxis and Hives    Pecans  . Peanut-Containing Drug Products Anaphylaxis, Hives and Swelling    Swelling of throat  . Morphine Other (See Comments)    Severe headache- Can tolerate Dilaudid, however  . Oxycodone-Acetaminophen Other (See Comments)    Headache 6.22.2020 patient is currently taking and tolerating  . Penicillins Rash and Other (See Comments)    FLUSHED & RED, also Has patient had a PCN reaction causing immediate rash, facial/tongue/throat swelling, SOB or lightheadedness with hypotension: Yes Has patient had a PCN reaction causing severe rash involving mucus membranes or skin necrosis: No Has patient had a PCN reaction that required hospitalization: No Has patient had a PCN reaction occurring within the last 10 years: No If all of the above answers are "NO", then may proceed with Cephalosporin use  . Promethazine Hcl Other (See Comments)    Makes him feel "drunk" at higher strengths    FAMILY HISTORY: Family History  Problem  Relation Age of Onset  . Breast cancer Mother   . Hypertension Mother   . Hyperlipidemia Mother   . Diabetes Mother   .  Prostate cancer Father   . Colon polyps Father   . Hyperlipidemia Father   . Crohn's disease Paternal Aunt   . Diabetes Maternal Grandmother   . Diabetes Brother        x 3  . Heart attack Brother   . Heart disease Brother        x 3  . Heart attack Brother   . Hyperlipidemia Brother        x 3  . Alcohol abuse Daughter   . Drug abuse Daughter   . Asthma Brother   . CVA Brother   . Colon cancer Neg Hx   . Esophageal cancer Neg Hx   . Rectal cancer Neg Hx   . Stomach cancer Neg Hx    ***.  SOCIAL HISTORY: Social History   Socioeconomic History  . Marital status: Widowed    Spouse name: Not on file  . Number of children: 3  . Years of education: Not on file  . Highest education level: Bachelor's degree (e.g., BA, AB, BS)  Occupational History  . Occupation: retired  Engineer, production  . Financial resource strain: Not on file  . Food insecurity    Worry: Not on file    Inability: Not on file  . Transportation needs    Medical: Not on file    Non-medical: Not on file  Tobacco Use  . Smoking status: Never Smoker  . Smokeless tobacco: Never Used  . Tobacco comment: occ wine  Substance and Sexual Activity  . Alcohol use: Yes    Comment: occasional wine- 1-2 per week  . Drug use: No  . Sexual activity: Not Currently    Comment: declined condoms  Lifestyle  . Physical activity    Days per week: Not on file    Minutes per session: Not on file  . Stress: Not on file  Relationships  . Social Musician on phone: Not on file    Gets together: Not on file    Attends religious service: Not on file    Active member of club or organization: Not on file    Attends meetings of clubs or organizations: Not on file    Relationship status: Not on file  . Intimate partner violence    Fear of current or ex partner: Not on file    Emotionally  abused: Not on file    Physically abused: Not on file    Forced sexual activity: Not on file  Other Topics Concern  . Not on file  Social History Narrative   Patient is right-handed. He is a widower. He lives alone in a single level home. He is active around his home. He drinks 2-3 cups of coffee a day.    REVIEW OF SYSTEMS: Constitutional: No fevers, chills, or sweats, no generalized fatigue, change in appetite Eyes: No visual changes, double vision, eye pain Ear, nose and throat: No hearing loss, ear pain, nasal congestion, sore throat Cardiovascular: No chest pain, palpitations Respiratory:  No shortness of breath at rest or with exertion, wheezes GastrointestinaI: No nausea, vomiting, diarrhea, abdominal pain, fecal incontinence Genitourinary:  No dysuria, urinary retention or frequency Musculoskeletal:  No neck pain, back pain Integumentary: No rash, pruritus, skin lesions Neurological: as above Psychiatric: No depression, insomnia, anxiety Endocrine: No palpitations, fatigue, diaphoresis, mood swings, change in appetite, change in weight, increased thirst Hematologic/Lymphatic:  No purpura, petechiae. Allergic/Immunologic: no itchy/runny eyes, nasal congestion, recent allergic reactions, rashes  PHYSICAL EXAM: ***  General: No acute distress.  Patient appears ***-groomed.   Head:  Normocephalic/atraumatic Eyes:  Fundi examined but not visualized Neck: supple, no paraspinal tenderness, full range of motion Heart:  Regular rate and rhythm Lungs:  Clear to auscultation bilaterally Back: No paraspinal tenderness Neurological Exam: alert and oriented to person, place, and time. Attention span and concentration intact, recent and remote memory intact, fund of knowledge intact.  Speech fluent and not dysarthric, language intact.  CN II-XII intact. Bulk and tone normal, muscle strength 5/5 throughout.  Sensation to light touch, temperature and vibration intact.  Deep tendon reflexes 2+  throughout, toes downgoing.  Finger to nose and heel to shin testing intact.  Gait normal, Romberg negative.  IMPRESSION: ***  PLAN: ***  Christopher Millet, DO  CC: ***

## 2019-02-22 ENCOUNTER — Ambulatory Visit: Payer: Medicare Other | Admitting: Neurology

## 2019-02-22 ENCOUNTER — Other Ambulatory Visit: Payer: Self-pay | Admitting: Cardiology

## 2019-02-24 DIAGNOSIS — L97521 Non-pressure chronic ulcer of other part of left foot limited to breakdown of skin: Secondary | ICD-10-CM | POA: Diagnosis not present

## 2019-02-25 ENCOUNTER — Other Ambulatory Visit: Payer: Self-pay | Admitting: Internal Medicine

## 2019-02-28 ENCOUNTER — Telehealth: Payer: Self-pay | Admitting: Radiology

## 2019-02-28 ENCOUNTER — Other Ambulatory Visit: Payer: Self-pay

## 2019-02-28 ENCOUNTER — Telehealth: Payer: Self-pay

## 2019-02-28 MED ORDER — CLINDAMYCIN HCL 300 MG PO CAPS: ORAL_CAPSULE | ORAL | 0 refills | Status: DC

## 2019-02-28 NOTE — Telephone Encounter (Signed)
[name] with [facility] would like a note/letter faxed to [phone] stating if pre-medication is needed before dental treatment. Patient has appointment today at 2pm.  Cb# is [phone].  Please advise.  Thank you.

## 2019-02-28 NOTE — Telephone Encounter (Signed)
Received voicemail from [place] with dental office requesting return call.  I called and spoke with Whitney who states they need documentation that patient requires pre-med and for how long. They would also like to know medication prescribed by Dr. Ninfa Linden with instructions for dental procedures.   Request info be faxed to [phone].  Per Dr. Ninfa Linden, pre-med for life.   Letter completed and faxed.

## 2019-02-28 NOTE — Telephone Encounter (Signed)
I do think dental premed with antibiotics is necessary for a patient like him with his comorbidities.

## 2019-02-28 NOTE — Telephone Encounter (Signed)
Want him to have pre-med?

## 2019-02-28 NOTE — Telephone Encounter (Signed)
LMOM for dentist letting them know he DOES need pre-med

## 2019-02-28 NOTE — Telephone Encounter (Signed)
[name] with [facility] would like a note/letter faxed stating if patient needs pre-medication before dental treatment.  Stated that patient has an appointment today at 2pm.  Fax# [phone], Cb# [phone].  Please advise.  Thank you.

## 2019-02-28 NOTE — Telephone Encounter (Signed)
[facility] did THA

## 2019-03-09 ENCOUNTER — Other Ambulatory Visit: Payer: Self-pay | Admitting: Infectious Diseases

## 2019-03-09 ENCOUNTER — Other Ambulatory Visit: Payer: Self-pay | Admitting: *Deleted

## 2019-03-09 DIAGNOSIS — B20 Human immunodeficiency virus [HIV] disease: Secondary | ICD-10-CM

## 2019-03-09 MED ORDER — DARUNAVIR ETHANOLATE 800 MG PO TABS: 800.0000 mg | ORAL_TABLET | Freq: Every day | ORAL | 5 refills | Status: DC

## 2019-03-09 MED ORDER — GENVOYA 150-150-200-10 MG PO TABS: 1.0000 | ORAL_TABLET | Freq: Every day | ORAL | 5 refills | Status: DC

## 2019-03-21 ENCOUNTER — Telehealth: Payer: Self-pay | Admitting: Orthopaedic Surgery

## 2019-03-21 ENCOUNTER — Encounter: Payer: Self-pay | Admitting: Internal Medicine

## 2019-03-21 ENCOUNTER — Other Ambulatory Visit: Payer: Self-pay | Admitting: Internal Medicine

## 2019-03-21 DIAGNOSIS — H04123 Dry eye syndrome of bilateral lacrimal glands: Secondary | ICD-10-CM | POA: Diagnosis not present

## 2019-03-21 DIAGNOSIS — H40013 Open angle with borderline findings, low risk, bilateral: Secondary | ICD-10-CM | POA: Diagnosis not present

## 2019-03-21 DIAGNOSIS — H532 Diplopia: Secondary | ICD-10-CM | POA: Diagnosis not present

## 2019-03-21 DIAGNOSIS — H0520 Unspecified exophthalmos: Secondary | ICD-10-CM | POA: Diagnosis not present

## 2019-03-21 DIAGNOSIS — Z961 Presence of intraocular lens: Secondary | ICD-10-CM | POA: Diagnosis not present

## 2019-03-21 DIAGNOSIS — H11823 Conjunctivochalasis, bilateral: Secondary | ICD-10-CM | POA: Diagnosis not present

## 2019-03-21 DIAGNOSIS — H16213 Exposure keratoconjunctivitis, bilateral: Secondary | ICD-10-CM | POA: Diagnosis not present

## 2019-03-21 DIAGNOSIS — B59 Pneumocystosis: Secondary | ICD-10-CM

## 2019-03-21 DIAGNOSIS — H26493 Other secondary cataract, bilateral: Secondary | ICD-10-CM | POA: Diagnosis not present

## 2019-03-21 MED ORDER — SULFAMETHOXAZOLE-TRIMETHOPRIM 800-160 MG PO TABS: 1.0000 | ORAL_TABLET | Freq: Two times a day (BID) | ORAL | 3 refills | Status: DC

## 2019-03-21 NOTE — Telephone Encounter (Signed)
Returned call to patient left message on voicemail to return call

## 2019-03-22 DIAGNOSIS — L97521 Non-pressure chronic ulcer of other part of left foot limited to breakdown of skin: Secondary | ICD-10-CM | POA: Diagnosis not present

## 2019-03-23 ENCOUNTER — Ambulatory Visit (INDEPENDENT_AMBULATORY_CARE_PROVIDER_SITE_OTHER): Payer: Medicare Other | Admitting: Internal Medicine

## 2019-03-23 ENCOUNTER — Encounter: Payer: Self-pay | Admitting: Internal Medicine

## 2019-03-23 ENCOUNTER — Other Ambulatory Visit: Payer: Self-pay

## 2019-03-23 VITALS — BP 130/72 | HR 92 | Temp 98.3°F | Ht 69.0 in | Wt 188.0 lb

## 2019-03-23 DIAGNOSIS — D508 Other iron deficiency anemias: Secondary | ICD-10-CM

## 2019-03-23 DIAGNOSIS — I25708 Atherosclerosis of coronary artery bypass graft(s), unspecified, with other forms of angina pectoris: Secondary | ICD-10-CM | POA: Diagnosis not present

## 2019-03-23 DIAGNOSIS — E5111 Dry beriberi: Secondary | ICD-10-CM | POA: Diagnosis not present

## 2019-03-23 DIAGNOSIS — L989 Disorder of the skin and subcutaneous tissue, unspecified: Secondary | ICD-10-CM

## 2019-03-23 NOTE — Patient Instructions (Signed)
Iron Deficiency Anemia, Adult Iron deficiency anemia is a condition in which the concentration of red blood cells or hemoglobin in the blood is below normal because of too little iron. Hemoglobin is a substance in red blood cells that carries oxygen to the body's tissues. When the concentration of red blood cells or hemoglobin is too low, not enough oxygen reaches these tissues. Iron deficiency anemia is usually long-lasting (chronic) and it develops over time. It may or may not cause symptoms. It is a common type of anemia. What are the causes? This condition may be caused by:  Not enough iron in the diet.  Blood loss caused by bleeding in the intestine.  Blood loss from a gastrointestinal condition like Crohn disease.  Frequent blood draws, such as from blood donation.  Abnormal absorption in the gut.  Heavy menstrual periods in women.  Cancers of the gastrointestinal system, such as colon cancer. What are the signs or symptoms? Symptoms of this condition may include:  Fatigue.  Headache.  Pale skin, lips, and nail beds.  Poor appetite.  Weakness.  Shortness of breath.  Dizziness.  Cold hands and feet.  Fast or irregular heartbeat.  Irritability. This is more common in severe anemia.  Rapid breathing. This is more common in severe anemia. Mild anemia may not cause any symptoms. How is this diagnosed? This condition is diagnosed based on:  Your medical history.  A physical exam.  Blood tests. You may have additional tests to find the underlying cause of your anemia, such as:  Testing for blood in the stool (fecal occult blood test).  A procedure to see inside your colon and rectum (colonoscopy).  A procedure to see inside your esophagus and stomach (endoscopy).  A test in which cells are removed from bone marrow (bone marrow aspiration) or fluid is removed from the bone marrow to be examined (biopsy). This is rarely needed. How is this treated? This  condition is treated by correcting the cause of your iron deficiency. Treatment may involve:  Adding iron-rich foods to your diet.  Taking iron supplements. If you are pregnant or breastfeeding, you may need to take extra iron because your normal diet usually does not provide the amount of iron that you need.  Increasing vitamin C intake. Vitamin C helps your body absorb iron. Your health care provider may recommend that you take iron supplements along with a glass of orange juice or a vitamin C supplement.  Medicines to make heavy menstrual flow lighter.  Surgery. You may need repeat blood tests to determine whether treatment is working. Depending on the underlying cause, the anemia should be corrected within 2 months of starting treatment. If the treatment does not seem to be working, you may need more testing. Follow these instructions at home: Medicines  Take over-the-counter and prescription medicines only as told by your health care provider. This includes iron supplements and vitamins.  If you cannot tolerate taking iron supplements by mouth, talk with your health care provider about taking them through a vein (intravenously) or an injection into a muscle.  For the best iron absorption, you should take iron supplements when your stomach is empty. If you cannot tolerate them on an empty stomach, you may need to take them with food.  Do not drink milk or take antacids at the same time as your iron supplements. Milk and antacids may interfere with iron absorption.  Iron supplements can cause constipation. To prevent constipation, include fiber in your diet as told   by your health care provider. A stool softener may also be recommended. Eating and drinking   Talk with your health care provider before changing your diet. He or she may recommend that you eat foods that contain a lot of iron, such as: ? Liver. ? Low-fat (lean) beef. ? Breads and cereals that have iron added to them (are  fortified). ? Eggs. ? Dried fruit. ? Dark green, leafy vegetables.  To help your body use the iron from iron-rich foods, eat those foods at the same time as fresh fruits and vegetables that are high in vitamin C. Foods that are high in vitamin C include: ? Oranges. ? Peppers. ? Tomatoes. ? Mangoes.  Drinkenoughfluid to keep your urine clear or pale yellow. General instructions  Return to your normal activities as told by your health care provider. Ask your health care provider what activities are safe for you.  Practice good hygiene. Anemia can make you more prone to illness and infection.  Keep all follow-up visits as told by your health care provider. This is important. Contact a health care provider if:  You feel nauseous or you vomit.  You feel weak.  You have unexplained sweating.  You develop symptoms of constipation, such as: ? Having fewer than three bowel movements a week. ? Straining to have a bowel movement. ? Having stools that are hard, dry, or larger than normal. ? Feeling full or bloated. ? Pain in the lower abdomen. ? Not feeling relief after having a bowel movement. Get help right away if:  You faint. If this happens, do not drive yourself to the hospital. Call your local emergency services (911 in the [place]  You have chest pain.  You have shortness of breath that: ? Is severe. ? Gets worse with physical activity.  You have a rapid heartbeat.  You become light-headed when getting up from a sitting or lying down position. This information is not intended to replace advice given to you by your health care provider. Make sure you discuss any questions you have with your health care provider. Document Released: 04/25/2000 Document Revised: 04/10/2017 Document Reviewed: 01/16/2016 Elsevier Patient Education  2020 [employer]  

## 2019-03-23 NOTE — Progress Notes (Signed)
Subjective:  Patient ID: [name], male    DOB: 07-Sep-1950  Age: 68 y.o. MRN: [id number]  CC: Anemia   HPI [name] presents for f/up - He is concerned about a lesion on his left frontal scalp and wants to see a dermatologist.  He says it has been there for over a year.  He is status post iron infusions and tells me he is feeling better.  Outpatient Medications Prior to Visit  Medication Sig Dispense Refill  . acetaminophen (TYLENOL) 500 MG tablet Take 500-1,000 mg by mouth every 6 (six) hours as needed for mild pain or moderate pain.     [name] atorvastatin (LIPITOR) 10 MG tablet TAKE 1 TABLET BY MOUTH AT BEDTIME 30 tablet 9  . Brexpiprazole (REXULTI) 0.25 MG TABS Take 1 tablet by mouth daily. 90 tablet 1  . clindamycin (CLEOCIN) 300 MG capsule Take two by mouth one hour before dental appointment, then two by mouth six hours after appointment 12 capsule 0  . darunavir (PREZISTA) 800 MG tablet Take 1 tablet (800 mg total) by mouth daily. 30 tablet 5  . elvitegravir-cobicistat-emtricitabine-tenofovir (GENVOYA) 150-150-200-10 MG TABS tablet Take 1 tablet by mouth daily with breakfast. 30 tablet 5  . EPINEPHrine (EPIPEN 2-PAK) 0.3 mg/0.3 mL IJ SOAJ injection Inject 0.3 mg into the muscle as needed for anaphylaxis.    [name] escitalopram (LEXAPRO) 20 MG tablet Take 1 tablet (20 mg total) by mouth daily. 90 tablet 1  . famotidine (PEPCID) 40 MG tablet TAKE 1 TABLET BY MOUTH TWICE A DAY 60 tablet 2  . folic acid (FOLVITE) 1 MG tablet Take 1 mg by mouth daily.  11  . HYDROcodone-acetaminophen (NORCO) 7.5-325 MG tablet Take 1-2 tablets by mouth every 6 (six) hours as needed for severe pain (pain score 7-10). 40 tablet 0  . leflunomide (ARAVA) 20 MG tablet Take 20 mg by mouth daily.     . metoprolol succinate (TOPROL-XL) 25 MG 24 hr tablet Take 1 tablet (25 mg total) by mouth daily. 90 tablet 1  . nitroGLYCERIN (NITROSTAT) 0.4 MG SL tablet Place 1 tablet (0.4 mg total) under the tongue every  5 (five) minutes as needed for chest pain. 25 tablet 2  . ondansetron (ZOFRAN) 4 MG tablet Take 4 mg by mouth every 6 (six) hours as needed for nausea.    . potassium chloride SA (K-DUR) 20 MEQ tablet Take 1 tablet (20 mEq total) by mouth 3 (three) times daily. MAY TAKE ADDITIONAL TAB AS DIRECTED BY MD 280 tablet 1  . predniSONE (DELTASONE) 5 MG tablet Take 5 mg by mouth 2 (two) times daily.   1  . pregabalin (LYRICA) 200 MG capsule TAKE 1 CAPSULE BY MOUTH TWICE A DAY 60 capsule 5  . SAVAYSA 30 MG TABS tablet TAKE 1 TABLET (30 MG TOTAL) BY MOUTH DAILY. 30 tablet 5  . sulfamethoxazole-trimethoprim (BACTRIM DS) 800-160 MG tablet Take 1 tablet by mouth every 12 (twelve) hours. 60 tablet 3  . torsemide (DEMADEX) 20 MG tablet Take 2 tablets (40 mg total) by mouth daily. 40MG  X5 DAYS THEN BACK TO 20MG  DAILY 40 tablet 1  . valACYclovir (VALTREX) 500 MG tablet TAKE 1 TABLET BY MOUTH EVERY DAY 30 tablet 2   No facility-administered medications prior to visit.     ROS Review of Systems  Constitutional: Positive for fatigue. Negative for chills, diaphoresis and fever.  HENT: Negative.   Eyes: Negative for visual disturbance.  Respiratory: Negative for cough,  chest tightness, shortness of breath and wheezing.   Cardiovascular: Negative for chest pain, palpitations and leg swelling.  Gastrointestinal: Negative for abdominal pain, blood in stool, diarrhea, nausea and vomiting.  Endocrine: Negative.   Genitourinary: Negative.  Negative for difficulty urinating.  Musculoskeletal: Negative.  Negative for arthralgias and myalgias.  Skin: Negative.  Negative for color change and pallor.  Neurological: Negative.  Negative for dizziness, weakness, light-headedness and headaches.  Hematological: Negative for adenopathy. Does not bruise/bleed easily.  Psychiatric/Behavioral: Negative.     Objective:  BP 130/72 (BP Location: Left Arm, Patient Position: Sitting, Cuff Size: Normal)   Pulse 92   Temp 98.3 F  (36.8 C) (Oral)   Ht 5\' 9"  (1.753 m)   Wt 188 lb (85.3 kg)   SpO2 96%   BMI 27.76 kg/m   BP Readings from Last 3 Encounters:  03/23/19 130/72  12/30/18 (!) 144/70  12/16/18 (!) 151/85    Wt Readings from Last 3 Encounters:  03/23/19 188 lb (85.3 kg)  12/30/18 187 lb (84.8 kg)  12/16/18 183 lb 9 oz (83.3 kg)    Physical Exam Vitals signs reviewed.  HENT:     Nose: Nose normal.     Mouth/Throat:     Mouth: Mucous membranes are moist.  Eyes:     General: No scleral icterus.    Conjunctiva/sclera: Conjunctivae normal.  Neck:     Musculoskeletal: Neck supple.  Cardiovascular:     Rate and Rhythm: Normal rate and regular rhythm.     Heart sounds: No murmur.  Pulmonary:     Breath sounds: Examination of the right-lower field reveals rales. Examination of the left-lower field reveals rales. Rales present. No wheezing or rhonchi.  Abdominal:     General: Abdomen is flat.     Palpations: There is no mass.     Tenderness: There is no abdominal tenderness.  Musculoskeletal: Normal range of motion.     Right foot: Deformity present.     Left foot: Deformity present.       Feet:  Feet:     Right foot:     Skin integrity: Skin integrity normal.     Left foot:     Skin integrity: Ulcer and callus present. No blister, erythema or warmth.  Skin:    General: Skin is warm and dry.     Comments: Left frontal scalp shows a 1 cm slightly ulcerated, raised lesion that looks suspicious for squamous cell carcinoma.  See photo.  Neurological:     General: No focal deficit present.     Mental Status: He is alert.  Psychiatric:        Mood and Affect: Mood normal.        Behavior: Behavior normal.     Lab Results  Component Value Date   WBC 8.8 12/16/2018   HGB 11.9 (L) 12/16/2018   HCT 40.0 12/16/2018   PLT 167 12/16/2018   GLUCOSE 108 (H) 01/31/2019   CHOL 162 10/28/2018   TRIG 152 (H) 10/28/2018   HDL 35 (L) 10/28/2018   LDLCALC 102 (H) 10/28/2018   ALT 9 12/16/2018    AST 12 (L) 12/16/2018   NA 140 01/31/2019   K 3.6 01/31/2019   CL 101 01/31/2019   CREATININE 0.83 01/31/2019   BUN 15 01/31/2019   CO2 25 01/31/2019   TSH 1.29 11/16/2018   INR 1.61 11/19/2017   HGBA1C 5.1 11/16/2018   MICROALBUR 2.6 (H) 11/16/2018    Ct Thoracic Spine Wo  Contrast  Result Date: 02/05/2019 CLINICAL DATA:  Thoracolumbar fusion. Evaluate hardware. Idiopathic scoliosis. Bilateral leg numbness tingling and weakness. EXAM: CT THORACIC SPINE WITHOUT CONTRAST TECHNIQUE: Multidetector CT images of the thoracic were obtained using the standard protocol without intravenous contrast. COMPARISON:  CT thoracic spine 12/28/2018. MRI thoracic spine 01/13/2019 FINDINGS: Alignment: Normal thoracic alignment Kyphoscoliosis in the lumbar spine. See separate lumbar spine CT report. Vertebrae: Negative for fracture or mass. Pedicle screw and posterior rod fusion T11 through the sacrum. Hardware is unchanged in position from the prior study. No hardware failure or displacement compared with the prior study. Paraspinal and other soft tissues: Negative for paraspinous mass or fluid collection. Small hiatal hernia Calcified and hilar mediastinal lymph nodes bilaterally. Prior CABG.  Coronary artery calcification. Visualized lungs reveal no acute abnormality. Pleural thickening in the left lung base. Disc levels: T1-2: Mild disc and facet degeneration without significant stenosis T2-3: Negative T3-4: Negative T4-5: Negative T5-6: Mild facet degeneration on the left.  Negative for stenosis T6-7: Mild facet degeneration left greater than right. Negative for stenosis T7-8: Mild facet degeneration bilaterally. Mild foraminal narrowing bilaterally due to facet hypertrophy. T8-9: Disc degeneration with vacuum disc phenomena anteriorly on the right and prominent anterior osteophytes on the right. Bilateral facet degeneration causing mild foraminal encroachment bilaterally T9-10: Mild disc degeneration with disc space  calcification and mild anterior osteophyte. No significant spinal or foraminal stenosis T10-11: Mild disc degeneration anterior osteophyte. Mild facet degeneration without significant stenosis T10-11: Bilateral pedicle screw fusion. Posterior bony fusion. Hardware in good position. Negative for stenosis. T11-12: Bilateral pedicle screw fusion. Posterior bony fusion. Hardware in good position. Negative for stenosis. IMPRESSION: No acute abnormality and no change from recent studies Kyphoscoliosis in the lumbar spine.  No acute thoracic fracture Bilateral pedicle screw and rod fusion from T10 through the sacrum. Hardware in satisfactory position and unchanged from the recent CT of 12/28/2018. Degenerative changes as above. Electronically Signed   By: Marlan Palau M.D.   On: 02/05/2019 07:41   Ct Lumbar Spine Wo Contrast  Result Date: 02/05/2019 CLINICAL DATA:  Lumbar fusion. Evaluate hardware. Numbness tingling weakness in legs. EXAM: CT LUMBAR SPINE WITHOUT CONTRAST TECHNIQUE: Multidetector CT imaging of the lumbar spine was performed without intravenous contrast administration. Multiplanar CT image reconstructions were also generated. COMPARISON:  CT lumbar spine 12/28/2018.  Lumbar MRI 01/13/2019 FINDINGS: Segmentation: Normal Alignment: Mild anterolisthesis L1-2. 6 mm retrolisthesis L2-3 unchanged. Slight anterolisthesis L4-5. 24 degrees of levoscoliosis at L2-3 unchanged. 23 degrees of kyphosis at L2, unchanged from prior. Vertebrae: Negative for fracture or mass. Pedicle screw and rod fusion from the thoracic spine extending to the sacrum. Hardware in satisfactory position. No hardware failure or displacement compared to the prior study. Paraspinal and other soft tissues: Mild atherosclerotic aorta without aneurysm Marked enlargement of the left iliacus muscle similar to prior study. Left hip replacement with acetabular screws extending into the left iliacus muscle. Possible hematoma or inflammation. This  area is incompletely evaluated on the current lumbar spine CT. Correlate with symptoms. Disc levels: L1-2: Mild anterolisthesis. Left L1 screw passes through the subarticular recess, unchanged from prior. Posterior laminectomy. Solid posterior bony fusion. L2-3: Mild retrolisthesis. Left L2 screw has been removed, unchanged from the prior study. Posterior decompression. Mild foraminal narrowing bilaterally. Interbody spacer unchanged from the prior study position with subsidence into the superior endplate of L3. No definite solid interbody bone fusion. L3-4: Bilateral pedicle screw fusion. Hardware in good position. Solid interbody fusion. Solid posterior bony fusion.  L4-5: Bilateral pedicle screw fusion. Hardware in good position. Solid fusion posteriorly with decompression. No interbody fusion. Mild left foraminal narrowing due to spurring L5-S1: Bilateral pedicle screw and rod fusion. Right S1 screw passes through the subarticular recess unchanged from prior. Marked facet hypertrophy bilaterally. Mild foraminal narrowing on the left due to spurring. IMPRESSION: Kyphoscoliosis, stable from prior Thoracolumbar fusion. No hardware failure or displacement compared to the prior study. Right S1 screw passes through the subarticular zone. Left L1 screw passes through the subarticular recess. This is unchanged. Marked enlargement left iliacus muscle, unchanged from prior study. Prior left hip replacement with screws extending into the muscle. This could represent hematoma. Electronically Signed   By: Marlan Palau M.D.   On: 02/05/2019 07:54    Assessment & Plan:   [name] was seen today for anemia.  Diagnoses and all orders for this visit:  Thiamine deficiency neuropathy- I will monitor his H&H and his thiamine level.  If he has a thiamine deficiency then I will treat it. -     CBC with Differential; Future -     Vitamin B1; Future  Iron deficiency anemia due to dietary causes- Will recheck his H&H and his  iron level. -     CBC with Differential; Future -     IBC panel; Future -     Ferritin; Future  Skin lesion of face -     Ambulatory referral to Dermatology   I am having [name]. Bonura "Phil" maintain his predniSONE, folic acid, nitroGLYCERIN, leflunomide, acetaminophen, EPINEPHrine, ondansetron, Rexulti, escitalopram, HYDROcodone-acetaminophen, torsemide, potassium chloride SA, metoprolol succinate, Savaysa, valACYclovir, pregabalin, atorvastatin, famotidine, clindamycin, darunavir, Genvoya, and sulfamethoxazole-trimethoprim.  No orders of the defined types were placed in this encounter.    Follow-up: Return in about 4 months (around 07/21/2019).  Sanda Linger, MD

## 2019-03-24 ENCOUNTER — Other Ambulatory Visit (INDEPENDENT_AMBULATORY_CARE_PROVIDER_SITE_OTHER): Payer: Medicare Other

## 2019-03-24 ENCOUNTER — Ambulatory Visit: Payer: Self-pay

## 2019-03-24 ENCOUNTER — Ambulatory Visit (INDEPENDENT_AMBULATORY_CARE_PROVIDER_SITE_OTHER): Payer: Medicare Other | Admitting: Orthopaedic Surgery

## 2019-03-24 ENCOUNTER — Ambulatory Visit (INDEPENDENT_AMBULATORY_CARE_PROVIDER_SITE_OTHER): Payer: Medicare Other

## 2019-03-24 ENCOUNTER — Encounter: Payer: Self-pay | Admitting: Orthopaedic Surgery

## 2019-03-24 DIAGNOSIS — Z96642 Presence of left artificial hip joint: Secondary | ICD-10-CM | POA: Diagnosis not present

## 2019-03-24 DIAGNOSIS — D508 Other iron deficiency anemias: Secondary | ICD-10-CM

## 2019-03-24 DIAGNOSIS — E5111 Dry beriberi: Secondary | ICD-10-CM | POA: Diagnosis not present

## 2019-03-24 DIAGNOSIS — I25708 Atherosclerosis of coronary artery bypass graft(s), unspecified, with other forms of angina pectoris: Secondary | ICD-10-CM | POA: Diagnosis not present

## 2019-03-24 LAB — CBC WITH DIFFERENTIAL/PLATELET
Basophils Absolute: 0 10*3/uL (ref 0.0–0.1)
Basophils Relative: 0.4 % (ref 0.0–3.0)
Eosinophils Absolute: 0 10*3/uL (ref 0.0–0.7)
Eosinophils Relative: 0.4 % (ref 0.0–5.0)
HCT: 39.1 % (ref 39.0–52.0)
Hemoglobin: 12.8 g/dL — ABNORMAL LOW (ref 13.0–17.0)
Lymphocytes Relative: 59.3 % — ABNORMAL HIGH (ref 12.0–46.0)
Lymphs Abs: 3.8 10*3/uL (ref 0.7–4.0)
MCHC: 32.7 g/dL (ref 30.0–36.0)
MCV: 106.6 fl — ABNORMAL HIGH (ref 78.0–100.0)
Monocytes Absolute: 0.2 10*3/uL (ref 0.1–1.0)
Monocytes Relative: 2.8 % — ABNORMAL LOW (ref 3.0–12.0)
Neutro Abs: 2.4 10*3/uL (ref 1.4–7.7)
Neutrophils Relative %: 37.1 % — ABNORMAL LOW (ref 43.0–77.0)
Platelets: 137 10*3/uL — ABNORMAL LOW (ref 150.0–400.0)
RBC: 3.67 Mil/uL — ABNORMAL LOW (ref 4.22–5.81)
RDW: 15 % (ref 11.5–15.5)
WBC: 6.4 10*3/uL (ref 4.0–10.5)

## 2019-03-24 LAB — IBC PANEL
Iron: 72 ug/dL (ref 42–165)
Saturation Ratios: 24.3 % (ref 20.0–50.0)
Transferrin: 212 mg/dL (ref 212.0–360.0)

## 2019-03-24 LAB — FERRITIN: Ferritin: 280.7 ng/mL (ref 22.0–322.0)

## 2019-03-24 MED ORDER — HYDROCODONE-ACETAMINOPHEN 7.5-325 MG PO TABS: 1.0000 | ORAL_TABLET | Freq: Four times a day (QID) | ORAL | 0 refills | Status: DC | PRN

## 2019-03-24 NOTE — Progress Notes (Signed)
The patient is continue to follow-up with a complex left hip history.  He had a left total hip arthroplasty earlier this year and had multiple dislocations after that with multiple attempts to revise the acetabular component given the severe kyphosis he has of the spine with diffuse pelvis as well which makes him more of a dislocation risk.  His last surgery was 5 months ago.  He does report a recent hard mechanical fall injuring that left hip but did not come out of place.  He does get around mainly in a electric scooter.  On exam clinically the hip is well located.  He does have significant pain in the groin.  X-rays today reviewed and compared to previous films.  These are always very difficult to evaluate and compared to previous films due to his posture and how he stands.  I did not see any complicating features on the films today.  I will send in some pain medication for him.  I would still like to see him back in 3 months unless things are worsening in any way.  At his next visit I would like Ivin Booty x-rays done with him laying down with an AP pelvis and lateral of his left hip.

## 2019-03-28 ENCOUNTER — Encounter: Payer: Self-pay | Admitting: Internal Medicine

## 2019-03-28 LAB — VITAMIN B1: Vitamin B1 (Thiamine): 8 nmol/L (ref 8–30)

## 2019-03-30 ENCOUNTER — Encounter: Payer: Self-pay | Admitting: Orthopaedic Surgery

## 2019-03-30 ENCOUNTER — Encounter: Payer: Self-pay | Admitting: Internal Medicine

## 2019-03-30 ENCOUNTER — Other Ambulatory Visit: Payer: Self-pay | Admitting: Internal Medicine

## 2019-03-30 DIAGNOSIS — B356 Tinea cruris: Secondary | ICD-10-CM

## 2019-03-30 DIAGNOSIS — B372 Candidiasis of skin and nail: Secondary | ICD-10-CM

## 2019-03-30 DIAGNOSIS — L22 Diaper dermatitis: Secondary | ICD-10-CM

## 2019-03-30 MED ORDER — FLUCONAZOLE 100 MG PO TABS: 100.0000 mg | ORAL_TABLET | Freq: Every day | ORAL | 0 refills | Status: AC

## 2019-03-31 ENCOUNTER — Ambulatory Visit: Payer: Medicare Other | Admitting: Internal Medicine

## 2019-03-31 ENCOUNTER — Ambulatory Visit: Payer: Medicare Other | Admitting: Orthopaedic Surgery

## 2019-04-04 ENCOUNTER — Ambulatory Visit: Payer: Medicare Other | Admitting: Orthopaedic Surgery

## 2019-04-13 ENCOUNTER — Other Ambulatory Visit: Payer: Self-pay

## 2019-04-13 ENCOUNTER — Telehealth: Payer: Self-pay | Admitting: Neurology

## 2019-04-13 ENCOUNTER — Ambulatory Visit (INDEPENDENT_AMBULATORY_CARE_PROVIDER_SITE_OTHER): Payer: Medicare Other | Admitting: Neurology

## 2019-04-13 ENCOUNTER — Encounter: Payer: Self-pay | Admitting: Neurology

## 2019-04-13 VITALS — BP 118/69 | HR 98 | Temp 96.9°F | Ht 69.0 in | Wt 188.5 lb

## 2019-04-13 DIAGNOSIS — B9735 Human immunodeficiency virus, type 2 [HIV 2] as the cause of diseases classified elsewhere: Secondary | ICD-10-CM | POA: Diagnosis not present

## 2019-04-13 DIAGNOSIS — R270 Ataxia, unspecified: Secondary | ICD-10-CM | POA: Diagnosis not present

## 2019-04-13 DIAGNOSIS — R4189 Other symptoms and signs involving cognitive functions and awareness: Secondary | ICD-10-CM | POA: Diagnosis not present

## 2019-04-13 DIAGNOSIS — I25708 Atherosclerosis of coronary artery bypass graft(s), unspecified, with other forms of angina pectoris: Secondary | ICD-10-CM | POA: Diagnosis not present

## 2019-04-13 DIAGNOSIS — Z8673 Personal history of transient ischemic attack (TIA), and cerebral infarction without residual deficits: Secondary | ICD-10-CM

## 2019-04-13 DIAGNOSIS — G3281 Cerebellar ataxia in diseases classified elsewhere: Secondary | ICD-10-CM | POA: Diagnosis not present

## 2019-04-13 DIAGNOSIS — B20 Human immunodeficiency virus [HIV] disease: Secondary | ICD-10-CM | POA: Diagnosis not present

## 2019-04-13 DIAGNOSIS — E1142 Type 2 diabetes mellitus with diabetic polyneuropathy: Secondary | ICD-10-CM

## 2019-04-13 DIAGNOSIS — Z7409 Other reduced mobility: Secondary | ICD-10-CM | POA: Insufficient documentation

## 2019-04-13 DIAGNOSIS — G909 Disorder of the autonomic nervous system, unspecified: Secondary | ICD-10-CM

## 2019-04-13 NOTE — Telephone Encounter (Signed)
Noted, thank you

## 2019-04-13 NOTE — Progress Notes (Addendum)
Provider:  Larey Seat, M D  Referring Provider: [name], MD Primary Care Physician:  [name], MD  Chief Complaint  Patient presents with  . New Patient (Initial Visit)    RM 11, alone. Paper referral for peripheral neuropathy from Kary Kos, MD.   . Gait Problem    In electric scooter today but able to stand to obtan weight.    HPI:  ERMINE CORDY is a 68 y.o. male seen on 04-13-2019 upon a referral from Dr. Saintclair Halsted for [facility] . after having seen neurologist Dr. Tomi Likens already since 2014. Anthony Waxler is a 67 year old right-handed Caucasian man with rheumatoid arthritis, HIV, peripheral neuropathy and history of TIA, recurrent DVT and lumbar spine surgery who presents for balance problems. History supplemented by referring provider note. His PCP is not aware of his referral. Dr Ronnald Ramp has treated him for thiamine deficiency ( patient is not a drinker he insisted) , he had 4 hip procedures, and didn't recover after wards.  He has lost muscle mass progressivly since 2019. Balance is off since. Eye-movements are off. He has been diagnosed with HIV in 1993, 27 years on retrovirals. This medication is known to cause neuropathy and there is primary HIV neuropathy as well. Dr [name] follows him.  [name] reports today that 3 month ago his legs just quit working, having to this day a horrible time walking.  His neurosurgeon by whom he is followed for scoliosis ordered a CT of his spine and could not find an explanation for the acute onset of walking difficulties.  He sent him also for EMG and nerve conduction studies.  He has a s/p thoracolumbar fusion CT scan of the thoracolumbar spine did not show what appears to be any solid fusions nor signs of complication such as loosening of his hardware but significant kyphotic deformity results.  EMG was performed and quoted here as showing profound peripheral polyneuropathy which would be a diagnosis obtained by nerve  conduction studies, not EMG.  I have no access to the original reports of the original studies.     Review of Systems: Out of a complete 14 system review, the patient complains of only the following symptoms, and all other reviewed systems are negative.  gait disorder, last hip surgery was July 6th, GAIT INSTABILITY started already 12 month ago, progressively over 3-4 month now.   Social History   Socioeconomic History  . Marital status: Widowed    Spouse name: Not on file  . Number of children: 3  . Years of education: Not on file  . Highest education level: Bachelor's degree (e.g., BA, AB, BS)  Occupational History  . Occupation: retired  Scientific laboratory technician  . Financial resource strain: Not on file  . Food insecurity    Worry: Not on file    Inability: Not on file  . Transportation needs    Medical: Not on file    Non-medical: Not on file  Tobacco Use  . Smoking status: Never Smoker  . Smokeless tobacco: Never Used  . Tobacco comment: occ wine  Substance and Sexual Activity  . Alcohol use: Yes    Comment: occasional wine- 1-2 per week  . Drug use: No  . Sexual activity: Not Currently    Comment: declined condoms  Lifestyle  . Physical activity    Days per week: Not on file    Minutes per session: Not on file  . Stress: Not  on file  Relationships  . Social Herbalist on phone: Not on file    Gets together: Not on file    Attends religious service: Not on file    Active member of club or organization: Not on file    Attends meetings of clubs or organizations: Not on file    Relationship status: Not on file  . Intimate partner violence    Fear of current or ex partner: Not on file    Emotionally abused: Not on file    Physically abused: Not on file    Forced sexual activity: Not on file  Other Topics Concern  . Not on file  Social History Narrative   Patient is right-handed. He is a widower. He lives alone in a single level home. He is active around his  home. He drinks 2-3 cups of coffee a day.    Family History  Problem Relation Age of Onset  . Breast cancer Mother   . Hypertension Mother   . Hyperlipidemia Mother   . Diabetes Mother   . Prostate cancer Father   . Colon polyps Father   . Hyperlipidemia Father   . Crohn's disease Paternal Aunt   . Diabetes Maternal Grandmother   . Diabetes Brother        x 3  . Heart attack Brother   . Heart disease Brother        x 3  . Heart attack Brother   . Hyperlipidemia Brother        x 3  . Alcohol abuse Daughter   . Drug abuse Daughter   . Asthma Brother   . CVA Brother   . Colon cancer Neg Hx   . Esophageal cancer Neg Hx   . Rectal cancer Neg Hx   . Stomach cancer Neg Hx     Past Medical History:  Diagnosis Date  . Allergy   . Anxiety   . Carotid artery occlusion    40-60% right ICA stenosis (09/2008)  . Cataract   . Chronic back pain   . CLL (chronic lymphoblastic leukemia) dx 2010   Followed at [facility] q42mo, no current therapy   . Clotting disorder ([place])   . Coronary artery disease 2010   s/p CABG '10, sees Dr. Percival Spanish  . Depression   . Diverticulosis   . DVT, lower extremity, recurrent ([place]) 2008, 2009   LLE, chronic anticoag since 2009  . Esophagitis   . Fibromyalgia   . Gallstones   . GERD (gastroesophageal reflux disease)   . Gout   . Gynecomastia, male   . H/O hiatal hernia 2008   surgery  . Hemorrhoids   . Hepatitis A yrs ago  . HIV infection ([facility]) dx 1993  . Hypertension   . Impotence of organic origin   . Myocardial infarction ([place]) 2010    x 2  . Neuromuscular disorder ([facility])    neuropathy  . Osteoarthritis, knee    s/p B TKA  . Osteoporosis   . Pneumonia mrach, may, july 2017  . Rheumatoid arthritis(714.0) dx 2010   MTX, follows with rheum  . Seasonal allergies   . Secondary syphilis 07/24/14 dx   s/p 2 wks doxy  . Status post dilation of esophageal narrowing   . Stroke ([place]) [place]   . TIA (transient ischemic attack) 1997   mild  residual L mouth droop  . Tubular adenoma of colon     Past Surgical History:  Procedure Laterality Date  . ANTERIOR HIP REVISION Left 08/17/2018   Procedure: ANTERIOR LEFT HIP REVISION;  Surgeon: Mcarthur Rossetti, MD;  Location: [facility];  Service: Orthopedics;  Laterality: Left;  . ANTERIOR HIP REVISION Left 11/02/2018   Procedure: LEFT HIP CONSTRAINED LINER REVISION;  Surgeon: Mcarthur Rossetti, MD;  Location: [facility];  Service: Orthopedics;  Laterality: Left;  . CHOLECYSTECTOMY    . COLONOSCOPY WITH PROPOFOL N/A 12/28/2012   Procedure: COLONOSCOPY WITH PROPOFOL;  Surgeon: Jerene Bears, MD;  [facility];  Service: Gastroenterology;  Laterality: N/A;  . CORONARY ARTERY BYPASS GRAFT  2010   triple bypass  . ESOPHAGOGASTRODUODENOSCOPY (EGD) WITH PROPOFOL N/A 12/28/2012   Procedure: ESOPHAGOGASTRODUODENOSCOPY (EGD) WITH PROPOFOL;  Surgeon: Jerene Bears, MD;  [facility];  Service: Gastroenterology;  Laterality: N/A;  . ESOPHAGOGASTRODUODENOSCOPY (EGD) WITH PROPOFOL N/A 03/15/2013   Procedure: ESOPHAGOGASTRODUODENOSCOPY (EGD) WITH PROPOFOL;  Surgeon: Jerene Bears, MD;  [facility];  Service: Gastroenterology;  Laterality: N/A;  . ESOPHAGOGASTRODUODENOSCOPY (EGD) WITH PROPOFOL N/A 02/07/2016   Procedure: ESOPHAGOGASTRODUODENOSCOPY (EGD) WITH PROPOFOL;  Surgeon: Jerene Bears, MD;  [facility];  Service: Gastroenterology;  Laterality: N/A;  . HARDWARE REMOVAL N/A 07/02/2012   Procedure: HARDWARE REMOVAL;  Surgeon: Elaina Hoops, MD;  Location: [facility];  Service: Neurosurgery;  Laterality: N/A;  . HIATAL HERNIA REPAIR     wrap  . HIP CLOSED REDUCTION Left 08/15/2018   Procedure: CLOSED REDUCTION HIP;  Surgeon: Newt Minion, MD;  Location: [facility];  Service: Orthopedics;  Laterality: Left;  . HIP CLOSED REDUCTION Left 08/28/2018   Procedure: CLOSED REDUCTION HIP FOR RECURRENT DISLOCATION AND DRESSING CHANGE;  Surgeon: Jessy Oto, MD;  Location: [facility];  Service: Orthopedics;  Laterality: Left;  . HIP CLOSED REDUCTION Left 09/15/2018   Procedure: CLOSED REDUCTION HIP DISLOCATION;  Surgeon: Mcarthur Rossetti, MD;  Location: [facility];  Service: Orthopedics;  Laterality: Left;  . HIP CLOSED REDUCTION Left 10/31/2018   Procedure: CLOSED REDUCTION LEFT TOTAL HIP;  Surgeon: Leandrew Koyanagi, MD;  Location: [facility];  Service: Orthopedics;  Laterality: Left;  . INGUINAL HERNIA REPAIR Bilateral   . JOINT REPLACEMENT Left 1999  . KNEE ARTHROPLASTY  07/22/2011   Procedure: COMPUTER ASSISTED TOTAL KNEE ARTHROPLASTY;  Surgeon: Meredith Pel, MD;  Location: [facility];  Service: Orthopedics;  Laterality: Right;  Right total knee arthroplasty  . MANDIBLE SURGERY Bilateral    tmj  . REPLACEMENT TOTAL KNEE Bilateral   . ring around testicle hernia reapir  184 and 1986   x 2  . ROTATOR CUFF REPAIR Right   . SHOULDER SURGERY Left   . SPINE SURGERY  2010   "rod and screws", "failed", lopwer spine,   . stent to heart x 1  2010  . TONSILLECTOMY    . TOTAL HIP ARTHROPLASTY Left 08/13/2018   Procedure: LEFT TOTAL HIP ARTHROPLASTY ANTERIOR APPROACH;  Surgeon: Mcarthur Rossetti, MD;  Location: [facility];  Service: Orthopedics;  Laterality: Left;  . TOTAL HIP ARTHROPLASTY Left 09/17/2018   Procedure: ANTERIOR LEFT HIP REVISION;  Surgeon: Mcarthur Rossetti, MD;  Location: [facility];  Service: Orthopedics;  Laterality: Left;  . UMBILICAL HERNIA REPAIR     x 1  . varicose vein     stripping  . VIDEO BRONCHOSCOPY Bilateral 10/16/2015   Procedure: VIDEO BRONCHOSCOPY WITHOUT FLUORO;  Surgeon: Brand Males, MD;  [facility];  Service: Cardiopulmonary;  Laterality: Bilateral;  Current Outpatient Medications  Medication Sig Dispense Refill  . acetaminophen (TYLENOL) 500 MG tablet Take 500-1,000 mg by mouth every 6 (six) hours as needed for mild pain or moderate pain.     [name] atorvastatin (LIPITOR) 10 MG tablet TAKE 1 TABLET BY MOUTH AT BEDTIME 30 tablet 9   . Brexpiprazole (REXULTI) 0.25 MG TABS Take 1 tablet by mouth daily. 90 tablet 1  . clindamycin (CLEOCIN) 300 MG capsule Take two by mouth one hour before dental appointment, then two by mouth six hours after appointment 12 capsule 0  . darunavir (PREZISTA) 800 MG tablet Take 1 tablet (800 mg total) by mouth daily. 30 tablet 5  . elvitegravir-cobicistat-emtricitabine-tenofovir (GENVOYA) 150-150-200-10 MG TABS tablet Take 1 tablet by mouth daily with breakfast. 30 tablet 5  . EPINEPHrine (EPIPEN 2-PAK) 0.3 mg/0.3 mL IJ SOAJ injection Inject 0.3 mg into the muscle as needed for anaphylaxis.    [name] escitalopram (LEXAPRO) 20 MG tablet Take 1 tablet (20 mg total) by mouth daily. 90 tablet 1  . famotidine (PEPCID) 40 MG tablet TAKE 1 TABLET BY MOUTH TWICE A DAY 60 tablet 2  . folic acid (FOLVITE) 1 MG tablet Take 1 mg by mouth daily.  11  . HYDROcodone-acetaminophen (NORCO) 7.5-325 MG tablet Take 1 tablet by mouth every 6 (six) hours as needed for severe pain (pain score 7-10). 40 tablet 0  . leflunomide (ARAVA) 20 MG tablet Take 20 mg by mouth daily.     . metoprolol succinate (TOPROL-XL) 25 MG 24 hr tablet Take 1 tablet (25 mg total) by mouth daily. 90 tablet 1  . nitroGLYCERIN (NITROSTAT) 0.4 MG SL tablet Place 1 tablet (0.4 mg total) under the tongue every 5 (five) minutes as needed for chest pain. 25 tablet 2  . ondansetron (ZOFRAN) 4 MG tablet Take 4 mg by mouth every 6 (six) hours as needed for nausea.    . potassium chloride SA (K-DUR) 20 MEQ tablet Take 1 tablet (20 mEq total) by mouth 3 (three) times daily. MAY TAKE ADDITIONAL TAB AS DIRECTED BY MD 280 tablet 1  . predniSONE (DELTASONE) 5 MG tablet Take 5 mg by mouth 2 (two) times daily.   1  . pregabalin (LYRICA) 200 MG capsule TAKE 1 CAPSULE BY MOUTH TWICE A DAY 60 capsule 5  . SAVAYSA 30 MG TABS tablet TAKE 1 TABLET (30 MG TOTAL) BY MOUTH DAILY. 30 tablet 5  . sulfamethoxazole-trimethoprim (BACTRIM DS) 800-160 MG tablet Take 1 tablet by mouth  every 12 (twelve) hours. 60 tablet 3  . torsemide (DEMADEX) 20 MG tablet Take 2 tablets (40 mg total) by mouth daily. 40MG  X5 DAYS THEN BACK TO 20MG  DAILY 40 tablet 1  . valACYclovir (VALTREX) 500 MG tablet TAKE 1 TABLET BY MOUTH EVERY DAY 30 tablet 2   No current facility-administered medications for this visit.     Allergies as of 04/13/2019 - Review Complete 04/13/2019  Allergen Reaction Noted  . Golimumab Anaphylaxis 01/21/2018  . Orencia [abatacept] Anaphylaxis 02/15/2018  . Other Anaphylaxis and Hives 07/08/2011  . Peanut-containing drug products Anaphylaxis, Hives, and Swelling 06/23/2012  . Morphine Other (See Comments) 08/13/2016  . Oxycodone-acetaminophen Other (See Comments)   . Penicillins Rash and Other (See Comments)   . Promethazine hcl Other (See Comments)     Vitals: BP 118/69 (BP Location: Right Arm, Patient Position: Sitting, Cuff Size: Normal)   Pulse 98   Temp (!) 96.9 F (36.1 C)   Ht 5\' 9"  (1.753 m)  Wt 188 lb 8 oz (85.5 kg)   SpO2 92%   BMI 27.84 kg/m  Last Weight:  Wt Readings from Last 1 Encounters:  04/13/19 188 lb 8 oz (85.5 kg)   Last Height:   Ht Readings from Last 1 Encounters:  04/13/19 5\' 9"  (1.753 m)    Physical exam:  General: The patient is awake, alert and appears not in acute distress. The patient is well groomed. Head: Normocephalic, atraumatic. Neck is supple.  Cardiovascular:  Regular rate and rhythm, without  murmurs or carotid bruit, and without distended neck veins. Respiratory: Lungs are clear to auscultation. Skin:  Without evidence of edema, or rash Trunk: BMI is 27  patient  has scoliosis and is I on power scooter , is using this since early March 2020 .  Neurologic exam : The patient is awake and alert, oriented to place and time.  Memory subjective described as impaired for short term. There is a normal attention span & concentration ability. Speech is fluent without dysarthria, mild dysphonia but not  aphasia. Mood and  affect are appropriate.  Cranial nerves: sense of smell and taste are bland.  Pupils are equal and briskly reactive to light. Funduscopic exam without   evidence of pallor or edema. Extraocular movements  in vertical and horizontal planes intact and without nystagmus. Diplopia with downward gaze, skewed images.  Visual fields by finger perimetry are intact. Hearing aids in situ. Facial sensation intact to fine touch. Facial motor strength is symmetric and tongue and uvula move midline.  Tongue protrusion into either cheek is normal. Shoulder shrug is normal.   Motor exam:   Very arthritis hands, muscle atrophy, grip strength loss.  Hands are numb to all modalities.  He can still feel temperature in his hands, not in his feet.   Sensory:  Hands are numb to all modalities.  He can still feel temperature in his hands, not in his feet.  Fine touch, pinprick and vibration were tested in all extremities.   Coordination: Rapid alternating movements in the fingers/hands were normal. Finger-to-nose maneuver with evidence of ataxia, left more than right dysmetria , and tremor.  Gait and station: Patient walks with assistance and is unable unassisted to climb up to the exam table.  Power scooter needed - he could barely get up to the scale.  Falls backwards when closing his eyes, retropulsion.   Deep tendon reflexes: in the  upper extremities are symmetric and intact. Absent in the lower extremities. Babinski maneuver response is equivocal.  Assessment:  After physical and neurologic examination, review of laboratory studies, imaging, neurophysiology testing and pre-existing records, assessment is that of :  Presumed neuropathy- this can hardly been a new finding given his degree of atrophy and ataxia,  I  would have liked to review the original NCV and EMG.  I also don't understand why he has to see yet another neurologist for a problem well known?    NCV and EMG will be needed in full length    MRI cervical spine.    Plan:  Treatment plan and additional workup :  NCV and EMG repeat ordered.   MRI cervical spine.     Thoracolumbar fusion.  Idiopathic scoliosis. Bilateral leg numbness tingling and weakness. Ataxia and dysmetria, neuropathic. Urinary incontinence, bowel incontinence.   CT 02-05-2019 IMPRESSION: No acute abnormality and no change from recent studies Kyphoscoliosis in the lumbar spine.  No acute thoracic fracture Bilateral pedicle screw and rod fusion from T10 through the  sacrum. Hardware in satisfactory position and unchanged from the recent CT of 12/28/2018.  Degenerative changes as above.   Electronically Signed   By: Franchot Gallo M.D.   On: 02/05/2019 07:41    ADDENDUM: I was just given the original documents that the patient's neurosurgeon had already faxed to our office figure not available to me and this morning's visit with Mr. Einck.  Dr. Viviano Simas reported that the patient had an electrodiagnostic study by [facility] in 2013 and a previous study in 2006 each of them apparently have documented peripheral polyneuropathy which has been progressing.  Also there was a rather gradual progress over time he feels that over the last 6 months it has been more acutely changing.  He does not have any longer lower extremity reflexes as I have also found out.  He has foot intrinsic muscle atrophy hand intrinsic muscle atrophy and he is no longer able to walk.  The electrodiagnostic study showed chronic neurogenic motor unit changes and motor unit dropout distal to the knee in both sides.  Additionally the left iliopsoas muscle was denervated with chronic neurogenic motor unit changes the paraspinals were normal bilaterally.   Diagnosis was that of a very severe, length dependent sensory and motor polyneuropathy-  Dr. Brien Few described this as an axonal loss mediated PN which can also occur after longstanding demyelination.  The very first  obtainable study from 2006 already spoke of a stocking distribution study in 2013 documented severe polyneuropathy involving both lower extremities and beginning at the upper extremities as well.   There is denervation potential in many muscles noted, intrinsic muscle atrophy in upper and lower extremities.   iliopsoas  Weakness without radiculopathy -  this is not an acute polyneuropathy-  this is a length dependent retroviral medication induced or viral induced polyneuropathy. Not ideopathic, not hereditary.    I do not think that we need to image the lumbar plexus at this time. I will cancel the NCV and EMG study repeat at [facility].   Asencion Partridge Tamecka Milham MD 04/13/2019    Cc: Drs Brien Few, Pia Mau.

## 2019-04-13 NOTE — Telephone Encounter (Signed)
LVM for pt informing him Dr. Brett Fairy has review his previous NCV/EMG and does not see a need to repeat. I advised we c/a his NCV/EMG for 1/27 and to call back with any questions.

## 2019-04-13 NOTE — Patient Instructions (Signed)
I cancel the EMG and NCV repeat.

## 2019-04-15 ENCOUNTER — Encounter: Payer: Self-pay | Admitting: Internal Medicine

## 2019-04-18 ENCOUNTER — Other Ambulatory Visit: Payer: Self-pay

## 2019-04-18 ENCOUNTER — Encounter: Payer: Self-pay | Admitting: Internal Medicine

## 2019-04-18 ENCOUNTER — Encounter: Payer: Self-pay | Admitting: Orthopaedic Surgery

## 2019-04-18 DIAGNOSIS — Z96642 Presence of left artificial hip joint: Secondary | ICD-10-CM

## 2019-04-19 NOTE — Telephone Encounter (Signed)
Called pt and he has picked up the cream and has started using it. Pt will call back if the cream does not help.

## 2019-04-23 ENCOUNTER — Other Ambulatory Visit: Payer: Self-pay | Admitting: Internal Medicine

## 2019-04-23 DIAGNOSIS — F418 Other specified anxiety disorders: Secondary | ICD-10-CM

## 2019-05-03 ENCOUNTER — Telehealth: Payer: Self-pay

## 2019-05-03 NOTE — Telephone Encounter (Signed)
[employer] auth needed for secondary, this is a reminder because patient is scheduled.

## 2019-05-07 ENCOUNTER — Other Ambulatory Visit: Payer: Self-pay | Admitting: Internal Medicine

## 2019-05-07 DIAGNOSIS — F418 Other specified anxiety disorders: Secondary | ICD-10-CM

## 2019-05-07 DIAGNOSIS — R600 Localized edema: Secondary | ICD-10-CM

## 2019-05-09 ENCOUNTER — Telehealth: Payer: Self-pay | Admitting: Neurology

## 2019-05-09 NOTE — Telephone Encounter (Signed)
[employer] review pending. I have faxed clinical notes to [phone], marked "urgent" due to being scheduled this Wednesday. Tracking number is [id number].

## 2019-05-11 ENCOUNTER — Other Ambulatory Visit: Payer: Self-pay

## 2019-05-11 ENCOUNTER — Ambulatory Visit
Admission: RE | Admit: 2019-05-11 | Discharge: 2019-05-11 | Disposition: A | Discharge status: Home / Self Care | Payer: Medicare Other | Source: Ambulatory Visit | Attending: Orthopaedic Surgery | Admitting: Orthopaedic Surgery

## 2019-05-11 ENCOUNTER — Ambulatory Visit
Admission: RE | Admit: 2019-05-11 | Discharge: 2019-05-11 | Disposition: A | Discharge status: Home / Self Care | Payer: Medicare Other | Source: Ambulatory Visit | Attending: Neurology | Admitting: Neurology

## 2019-05-11 DIAGNOSIS — R270 Ataxia, unspecified: Secondary | ICD-10-CM | POA: Diagnosis not present

## 2019-05-11 DIAGNOSIS — G909 Disorder of the autonomic nervous system, unspecified: Secondary | ICD-10-CM

## 2019-05-11 DIAGNOSIS — G3281 Cerebellar ataxia in diseases classified elsewhere: Secondary | ICD-10-CM | POA: Diagnosis not present

## 2019-05-11 DIAGNOSIS — R4189 Other symptoms and signs involving cognitive functions and awareness: Secondary | ICD-10-CM | POA: Diagnosis not present

## 2019-05-11 DIAGNOSIS — E1142 Type 2 diabetes mellitus with diabetic polyneuropathy: Secondary | ICD-10-CM

## 2019-05-11 DIAGNOSIS — Z7409 Other reduced mobility: Secondary | ICD-10-CM

## 2019-05-11 DIAGNOSIS — Z8673 Personal history of transient ischemic attack (TIA), and cerebral infarction without residual deficits: Secondary | ICD-10-CM | POA: Diagnosis not present

## 2019-05-11 DIAGNOSIS — Z96642 Presence of left artificial hip joint: Secondary | ICD-10-CM

## 2019-05-11 DIAGNOSIS — I25708 Atherosclerosis of coronary artery bypass graft(s), unspecified, with other forms of angina pectoris: Secondary | ICD-10-CM

## 2019-05-11 DIAGNOSIS — B20 Human immunodeficiency virus [HIV] disease: Secondary | ICD-10-CM

## 2019-05-11 DIAGNOSIS — B9735 Human immunodeficiency virus, type 2 [HIV 2] as the cause of diseases classified elsewhere: Secondary | ICD-10-CM

## 2019-05-11 MED ORDER — GADOBENATE DIMEGLUMINE 529 MG/ML IV SOLN
17.0000 mL | Freq: Once | INTRAVENOUS | Status: AC | PRN
  Administered 2019-05-11: 17 mL via INTRAVENOUS

## 2019-05-16 NOTE — Telephone Encounter (Signed)
bcbs [id number] valid thru 09/08/19   This was approved and GI was made aware.

## 2019-05-16 NOTE — Progress Notes (Signed)
No cerebellar atrophy, not disproportional to brain atrophy, no stroke, no tumor.

## 2019-05-16 NOTE — Progress Notes (Signed)
The spinal cord appears normal. 2.   There are multilevel degenerative changes as detailed above.  Mild spinal stenosis is noted at C5-C6, C6-C7 and T1-T2 but there does not appear to be any nerve root compression.  No CNS neurological explanation for ataxia.

## 2019-05-17 ENCOUNTER — Encounter: Payer: Self-pay | Admitting: Neurology

## 2019-05-17 ENCOUNTER — Other Ambulatory Visit: Payer: Self-pay

## 2019-05-17 ENCOUNTER — Encounter: Payer: Self-pay | Admitting: Orthopaedic Surgery

## 2019-05-17 ENCOUNTER — Ambulatory Visit (INDEPENDENT_AMBULATORY_CARE_PROVIDER_SITE_OTHER): Payer: Medicare Other | Admitting: Orthopaedic Surgery

## 2019-05-17 DIAGNOSIS — Z96642 Presence of left artificial hip joint: Secondary | ICD-10-CM | POA: Diagnosis not present

## 2019-05-17 NOTE — Progress Notes (Signed)
[name] is here today to go over the CT scan of his pelvis.  He is someone with a complicated history as it relates to his left hip.  He had severe AVN with femoral head collapse in the spring of last year.  We had to perform a total hip arthroplasty more urgently.  He is someone with severe kyphosis with his lumbar spine fused to his pelvis.  This made for a more difficult time getting his acetabular component position in a way to keep him from dislocating.  He then underwent several revisions of acetabular component due to continued dislocations.  He now has a constrained liner.  He had been doing well until a hard fall landing on that hip.  His hip remained in place but he still had pain around the groin.  I sent him for a CT scan to better evaluate the hip prosthesis.  He is here for review this today.  He still walks significantly and over.  He can cross his leg and put his shoes on now on the left side.  He ambulates with a cane.  He still is experiencing pain.  The CT scan is reviewed with him.  There is some radiolucent lines around some of the screws and there is definitely a crack in the pelvis with some protrusio of the socket itself.  Overall it looks stable.  Certainly this is a combination of trauma from his hard fall and soft bone combined with multiple surgeries on the acetabular area.  The femoral component looks good.  I want him to limit his weightbearing for now on that left lower extremity and mainly get around in a scooter.  I would like to see him back in 6 weeks with x-rays of his pelvis.  I would like to have a supine AP view of the pelvis and lateral of that left hip.  I will likely repeat a CT scan in about 3 months.  We want to hold off on any type of revision unless it is absolutely warranted and needed given his frailty and the COVID-19 pandemic.  He agrees with this treatment plan as well.

## 2019-05-24 ENCOUNTER — Other Ambulatory Visit: Payer: Self-pay | Admitting: Internal Medicine

## 2019-05-26 DIAGNOSIS — D485 Neoplasm of uncertain behavior of skin: Secondary | ICD-10-CM | POA: Diagnosis not present

## 2019-05-26 DIAGNOSIS — L131 Subcorneal pustular dermatitis: Secondary | ICD-10-CM | POA: Diagnosis not present

## 2019-05-26 DIAGNOSIS — C44612 Basal cell carcinoma of skin of right upper limb, including shoulder: Secondary | ICD-10-CM | POA: Diagnosis not present

## 2019-05-26 DIAGNOSIS — L905 Scar conditions and fibrosis of skin: Secondary | ICD-10-CM | POA: Diagnosis not present

## 2019-05-26 DIAGNOSIS — S8010XA Contusion of unspecified lower leg, initial encounter: Secondary | ICD-10-CM | POA: Diagnosis not present

## 2019-05-27 ENCOUNTER — Other Ambulatory Visit: Payer: Self-pay | Admitting: Cardiology

## 2019-05-27 DIAGNOSIS — E876 Hypokalemia: Secondary | ICD-10-CM

## 2019-05-28 ENCOUNTER — Encounter: Payer: Self-pay | Admitting: Internal Medicine

## 2019-05-31 DIAGNOSIS — C44329 Squamous cell carcinoma of skin of other parts of face: Secondary | ICD-10-CM | POA: Diagnosis not present

## 2019-06-01 NOTE — Progress Notes (Signed)
"  multiple retroperitoneal lymph nodes", History of Lymphoma.  "No spinal stenosis, post surgical changes noted. "

## 2019-06-05 ENCOUNTER — Encounter: Payer: Self-pay | Admitting: Cardiology

## 2019-06-05 DIAGNOSIS — Z7189 Other specified counseling: Secondary | ICD-10-CM | POA: Insufficient documentation

## 2019-06-05 NOTE — Progress Notes (Signed)
Cardiology Office Note   Date:  06/06/2019   ID:  [name], DOB May 14, 1950, MRN [id number]  PCP:  [name], MD  Cardiologist:   [name], MD   Chief Complaint  Patient presents with  . Chest Pain      History of Present Illness: [name] is a 69 y.o. male who presented for followup of his known coronary disease.   He has had progressive dyspnea.  He did have a negative stress perfusion study for any evidence of reversible ischemia in 2016 although there was a fixed defect. His ejection fractions well-preserved. It was well preserved on echo with some tricuspid regurgitation.  He did have some slight abnormalities on pulmonary function testing and has seen [name].  He had some interstitial lung disease thought possibly to be related to his rheumatoid arthritis.   He did have recent hip surgery.  He had to have 8 surgeries in 7 weeks.  Then he fell and injured the hip.    He comes in today because he has been having some increasing palpitations.  He has been feeling this for about 3 weeks.  His heart is flip flopping.  He feels a vague discomfort in his chest.  He feels lightheaded.  He feels short of breath.  He has not been able to check his blood pressure or heart rate during his events but he does have a blood pressure cuff at home.  He gets around with his cane.  He denies any cough fevers or chills.  He is not having any PND or orthopnea.  He is not describing substernal chest pressure, neck or arm discomfort.  He has had no weight gain or edema.   Past Medical History:  Diagnosis Date  . Allergy   . Anxiety   . Carotid artery occlusion    40-60% right ICA stenosis (09/2008)  . Cataract   . Chronic back pain   . CLL (chronic lymphoblastic leukemia) dx 2010   Followed at [facility] q52mo, no current therapy   . Clotting disorder ([place])   . Coronary artery disease 2010   s/p CABG '10, sees Dr. Percival Spanish  . Depression   . Diverticulosis   . DVT, lower  extremity, recurrent ([place]) 2008, 2009   LLE, chronic anticoag since 2009  . Esophagitis   . Fibromyalgia   . Gallstones   . GERD (gastroesophageal reflux disease)   . Gout   . Gynecomastia, male   . H/O hiatal hernia 2008   surgery  . Hemorrhoids   . Hepatitis A yrs ago  . HIV infection ([place]) dx 1993  . Hypertension   . Impotence of organic origin   . Myocardial infarction ([place]) 2010    x 2  . Neuromuscular disorder ([facility])    neuropathy  . Osteoarthritis, knee    s/p B TKA  . Osteoporosis   . Rheumatoid arthritis(714.0) dx 2010   MTX, follows with rheum  . Seasonal allergies   . Secondary syphilis 07/24/14 dx   s/p 2 wks doxy  . Status post dilation of esophageal narrowing   . TIA (transient ischemic attack) 1997   mild residual L mouth droop  . Tubular adenoma of colon     Past Surgical History:  Procedure Laterality Date  . ANTERIOR HIP REVISION Left 08/17/2018   Procedure: ANTERIOR LEFT HIP REVISION;  Surgeon: Mcarthur Rossetti, MD;  Location: [facility];  Service: Orthopedics;  Laterality: Left;  . ANTERIOR  HIP REVISION Left 11/02/2018   Procedure: LEFT HIP CONSTRAINED LINER REVISION;  Surgeon: Mcarthur Rossetti, MD;  Location: [facility];  Service: Orthopedics;  Laterality: Left;  . CHOLECYSTECTOMY    . COLONOSCOPY WITH PROPOFOL N/A 12/28/2012   Procedure: COLONOSCOPY WITH PROPOFOL;  Surgeon: Jerene Bears, MD;  Location: [facility];  Service: Gastroenterology;  Laterality: N/A;  . CORONARY ARTERY BYPASS GRAFT  2010   triple bypass  . ESOPHAGOGASTRODUODENOSCOPY (EGD) WITH PROPOFOL N/A 12/28/2012   Procedure: ESOPHAGOGASTRODUODENOSCOPY (EGD) WITH PROPOFOL;  Surgeon: Jerene Bears, MD;  [facility];  Service: Gastroenterology;  Laterality: N/A;  . ESOPHAGOGASTRODUODENOSCOPY (EGD) WITH PROPOFOL N/A 03/15/2013   Procedure: ESOPHAGOGASTRODUODENOSCOPY (EGD) WITH PROPOFOL;  Surgeon: Jerene Bears, MD;  [facility];  Service: Gastroenterology;  Laterality:  N/A;  . ESOPHAGOGASTRODUODENOSCOPY (EGD) WITH PROPOFOL N/A 02/07/2016   Procedure: ESOPHAGOGASTRODUODENOSCOPY (EGD) WITH PROPOFOL;  Surgeon: Jerene Bears, MD;  [facility];  Service: Gastroenterology;  Laterality: N/A;  . HARDWARE REMOVAL N/A 07/02/2012   Procedure: HARDWARE REMOVAL;  Surgeon: Elaina Hoops, MD;  Location: [facility];  Service: Neurosurgery;  Laterality: N/A;  . HIATAL HERNIA REPAIR     wrap  . HIP CLOSED REDUCTION Left 08/15/2018   Procedure: CLOSED REDUCTION HIP;  Surgeon: Newt Minion, MD;  Location: [facility];  Service: Orthopedics;  Laterality: Left;  . HIP CLOSED REDUCTION Left 08/28/2018   Procedure: CLOSED REDUCTION HIP FOR RECURRENT DISLOCATION AND DRESSING CHANGE;  Surgeon: Jessy Oto, MD;  Location: [facility];  Service: Orthopedics;  Laterality: Left;  . HIP CLOSED REDUCTION Left 09/15/2018   Procedure: CLOSED REDUCTION HIP DISLOCATION;  Surgeon: Mcarthur Rossetti, MD;  Location: [facility];  Service: Orthopedics;  Laterality: Left;  . HIP CLOSED REDUCTION Left 10/31/2018   Procedure: CLOSED REDUCTION LEFT TOTAL HIP;  Surgeon: Leandrew Koyanagi, MD;  Location: [facility];  Service: Orthopedics;  Laterality: Left;  . INGUINAL HERNIA REPAIR Bilateral   . JOINT REPLACEMENT Left 1999  . KNEE ARTHROPLASTY  07/22/2011   Procedure: COMPUTER ASSISTED TOTAL KNEE ARTHROPLASTY;  Surgeon: Meredith Pel, MD;  Location: [facility];  Service: Orthopedics;  Laterality: Right;  Right total knee arthroplasty  . MANDIBLE SURGERY Bilateral    tmj  . REPLACEMENT TOTAL KNEE Bilateral   . ring around testicle hernia reapir  184 and 1986   x 2  . ROTATOR CUFF REPAIR Right   . SHOULDER SURGERY Left   . SPINE SURGERY  2010   "rod and screws", "failed", lopwer spine,   . stent to heart x 1  2010  . TONSILLECTOMY    . TOTAL HIP ARTHROPLASTY Left 08/13/2018   Procedure: LEFT TOTAL HIP ARTHROPLASTY ANTERIOR APPROACH;  Surgeon: Mcarthur Rossetti, MD;  Location: [facility];  Service: Orthopedics;   Laterality: Left;  . TOTAL HIP ARTHROPLASTY Left 09/17/2018   Procedure: ANTERIOR LEFT HIP REVISION;  Surgeon: Mcarthur Rossetti, MD;  Location: [facility];  Service: Orthopedics;  Laterality: Left;  . UMBILICAL HERNIA REPAIR     x 1  . varicose vein     stripping  . VIDEO BRONCHOSCOPY Bilateral 10/16/2015   Procedure: VIDEO BRONCHOSCOPY WITHOUT FLUORO;  Surgeon: Brand Males, MD;  [facility];  Service: Cardiopulmonary;  Laterality: Bilateral;     Current Outpatient Medications  Medication Sig Dispense Refill  . acetaminophen (TYLENOL) 500 MG tablet Take 500-1,000 mg by mouth every 6 (six) hours as needed for mild pain or moderate pain.     [name]  atorvastatin (LIPITOR) 10 MG tablet TAKE 1 TABLET BY MOUTH AT BEDTIME 30 tablet 9  . clindamycin (CLEOCIN) 300 MG capsule Take two by mouth one hour before dental appointment, then two by mouth six hours after appointment 12 capsule 0  . darunavir (PREZISTA) 800 MG tablet Take 1 tablet (800 mg total) by mouth daily. 30 tablet 5  . elvitegravir-cobicistat-emtricitabine-tenofovir (GENVOYA) 150-150-200-10 MG TABS tablet Take 1 tablet by mouth daily with breakfast. 30 tablet 5  . EPINEPHrine (EPIPEN 2-PAK) 0.3 mg/0.3 mL IJ SOAJ injection Inject 0.3 mg into the muscle as needed for anaphylaxis.    [name] escitalopram (LEXAPRO) 20 MG tablet TAKE 1 TABLET BY MOUTH EVERY DAY 30 tablet 5  . famotidine (PEPCID) 40 MG tablet TAKE 1 TABLET BY MOUTH TWICE A DAY 99991111 tablet 0  . folic acid (FOLVITE) 1 MG tablet Take 1 mg by mouth daily.  11  . HYDROcodone-acetaminophen (NORCO) 7.5-325 MG tablet Take 1 tablet by mouth every 6 (six) hours as needed for severe pain (pain score 7-10). 40 tablet 0  . leflunomide (ARAVA) 20 MG tablet Take 20 mg by mouth daily.     . metoprolol succinate (TOPROL-XL) 50 MG 24 hr tablet Take 1 tablet (50 mg total) by mouth daily. 90 tablet 3  . nitroGLYCERIN (NITROSTAT) 0.4 MG SL tablet Place 1 tablet (0.4 mg total) under the tongue  every 5 (five) minutes as needed for chest pain. 25 tablet 2  . ondansetron (ZOFRAN) 4 MG tablet Take 4 mg by mouth every 6 (six) hours as needed for nausea.    . predniSONE (DELTASONE) 5 MG tablet Take 5 mg by mouth 2 (two) times daily.   1  . pregabalin (LYRICA) 200 MG capsule TAKE 1 CAPSULE BY MOUTH TWICE A DAY 60 capsule 5  . SAVAYSA 30 MG TABS tablet TAKE 1 TABLET (30 MG TOTAL) BY MOUTH DAILY. 30 tablet 5  . sulfamethoxazole-trimethoprim (BACTRIM DS) 800-160 MG tablet Take 1 tablet by mouth every 12 (twelve) hours. 60 tablet 3  . torsemide (DEMADEX) 20 MG tablet TAKE 1 TABLET BY MOUTH EVERY DAY 90 tablet 1  . valACYclovir (VALTREX) 500 MG tablet TAKE 1 TABLET BY MOUTH EVERY DAY 30 tablet 2   No current facility-administered medications for this visit.    Allergies:   Golimumab, Orencia [abatacept], Other, Peanut-containing drug products, Morphine, Oxycodone-acetaminophen, Penicillins, and Promethazine hcl   ROS:  Please see the history of present illness.   Otherwise, review of systems are positive for none.   All other systems are reviewed and negative.    PHYSICAL EXAM: VS:  BP 140/85   Pulse 92   Temp 97.9 F (36.6 C)   Ht 5\' 9"  (1.753 m)   Wt 185 lb 9.6 oz (84.2 kg)   SpO2 96%   BMI 27.41 kg/m  , BMI Body mass index is 27.41 kg/m. GENERAL:  Well appearing NECK:  No jugular venous distention, waveform within normal limits, carotid upstroke brisk and symmetric, no bruits, no thyromegaly LUNGS:  Clear to auscultation bilaterally CHEST:  Well healed sternotomy scar. HEART:  PMI not displaced or sustained,S1 and S2 within normal limits, no S3, no S4, no clicks, no rubs, no murmurs ABD:  Flat, positive bowel sounds normal in frequency in pitch, no bruits, no rebound, no guarding, no midline pulsatile mass, no hepatomegaly, no splenomegaly EXT:  2 plus pulses throughout, no edema, no cyanosis no clubbing    EKG:  EKG is ordered today. The ekg ordered today  demonstrates sinus  rhythm, rate 92, premature atrial contractions, no acute ST-T wave changes.   Recent Labs: 11/16/2018: Pro B Natriuretic peptide (BNP) 163.0; TSH 1.29 11/19/2018: Magnesium 1.9 12/16/2018: ALT 9 01/31/2019: BUN 15; Creatinine, Ser 0.83; Potassium 3.6; Sodium 140 03/24/2019: Hemoglobin 12.8; Platelets 137.0    Lipid Panel    Component Value Date/Time   CHOL 162 10/28/2018 1104   TRIG 152 (H) 10/28/2018 1104   HDL 35 (L) 10/28/2018 1104   CHOLHDL 4.6 10/28/2018 1104   VLDL 30 08/14/2016 1616   LDLCALC 102 (H) 10/28/2018 1104      Wt Readings from Last 3 Encounters:  06/06/19 185 lb 9.6 oz (84.2 kg)  04/13/19 188 lb 8 oz (85.5 kg)  03/23/19 188 lb (85.3 kg)      Other studies Reviewed: Additional studies/ records that were reviewed today include: None. Review of the above records demonstrates:  NA   ASSESSMENT AND PLAN:  Coronary artery disease:     I will have a low threshold for ischemia work-up given his complaints although they are not classic anginal symptoms.  Personal work-up and arrhythmia as below.  He will continue for now with risk reduction.   Of note he is heart rate did go up with ambulation today in the office.  His oxygen saturation sitting up to the very end of ambulation at 1000 to about 89% briefly.  Chronic dyspnea on exertion:  He continues to follow with pulmonary though is not had a recent appointment.  No change in therapy.  History of DVT:    He is on chronic anticoagulation therapy.   Palpitation: He is going to get a pulse oximeter so he can record his oxygen saturation when he is having these.  He is going to check Kardia.  Today I will check a TSH, CBC and basic metabolic profile.  I am going to increase his metoprolol to 50 mg daily.  I will see him back in 1 month after the above testing.  COVID-19 Education: He is trying to sign up for the vaccine.   Current medicines are reviewed at length with the patient today.  The patient does not have  concerns regarding medicines.  The following changes have been made:  As above  Labs/ tests ordered today include:   Orders Placed This Encounter  Procedures  . TSH  . CBC  . Basic metabolic panel  . LONG TERM MONITOR (3-14 DAYS)  . EKG 12-Lead     Disposition:   FU with me in one month.    Signed, [name], MD  06/06/2019 12:39 PM     [facility]

## 2019-06-06 ENCOUNTER — Encounter: Payer: Self-pay | Admitting: Cardiology

## 2019-06-06 ENCOUNTER — Other Ambulatory Visit: Payer: Self-pay

## 2019-06-06 ENCOUNTER — Encounter: Payer: Self-pay | Admitting: *Deleted

## 2019-06-06 ENCOUNTER — Ambulatory Visit (INDEPENDENT_AMBULATORY_CARE_PROVIDER_SITE_OTHER): Payer: Medicare Other | Admitting: Cardiology

## 2019-06-06 VITALS — BP 140/85 | HR 92 | Temp 97.9°F | Ht 69.0 in | Wt 185.6 lb

## 2019-06-06 DIAGNOSIS — R0600 Dyspnea, unspecified: Secondary | ICD-10-CM | POA: Diagnosis not present

## 2019-06-06 DIAGNOSIS — R002 Palpitations: Secondary | ICD-10-CM

## 2019-06-06 DIAGNOSIS — Z7189 Other specified counseling: Secondary | ICD-10-CM

## 2019-06-06 DIAGNOSIS — I2510 Atherosclerotic heart disease of native coronary artery without angina pectoris: Secondary | ICD-10-CM | POA: Diagnosis not present

## 2019-06-06 DIAGNOSIS — R0609 Other forms of dyspnea: Secondary | ICD-10-CM

## 2019-06-06 MED ORDER — METOPROLOL SUCCINATE ER 50 MG PO TB24: 50.0000 mg | ORAL_TABLET | Freq: Every day | ORAL | 3 refills | Status: DC

## 2019-06-06 NOTE — Progress Notes (Signed)
Patient ID: [name], male   DOB: 07-14-1950, 69 y.o.   MRN: [id number] Patient enrolled for [employer] to mail a 3 day ZIO XT Long term holter monitor to the patients home.

## 2019-06-06 NOTE — Patient Instructions (Signed)
Medication Instructions:  Increase Metoprolol to 50mg  daily  *If you need a refill on your cardiac medications before your next appointment, please call your pharmacy*  Lab Work: Your physician recommends that you return for lab work today (CBC, TSH, BMP) If you have labs (blood work) drawn today and your tests are completely normal, you will receive your results only by: [name] MyChart Message (if you have MyChart) OR . A paper copy in the mail If you have any lab test that is abnormal or we need to change your treatment, we will call you to review the results.  Testing/Procedures: Your physician has recommended that you wear an event monitor. Event monitors are medical devices that record the heart's electrical activity. Doctors most often [place] these monitors to diagnose arrhythmias. Arrhythmias are problems with the speed or rhythm of the heartbeat. The monitor is a small, portable device. You can wear one while you do your normal daily activities. This is usually used to diagnose what is causing palpitations/syncope (passing out).   Follow-Up: At [facility], you and your health needs are our priority.  As part of our continuing mission to provide you with exceptional heart care, we have created designated Provider Care Teams.  These Care Teams include your primary Cardiologist (physician) and Advanced Practice Providers (APPs -  Physician Assistants and Nurse Practitioners) who all work together to provide you with the care you need, when you need it.  Your next appointment:   1 month(s)  The format for your next appointment:   In Person  Provider:   [name], MD  Other Instructions Purchase a pulse oximeter to monitor your heart rate and oxygen levels.     [employer]- Long Term Monitor Instructions   Your physician has requested you wear your ZIO patch monitor__3__days.   This is a single patch monitor.  [employer] supplies one patch monitor per enrollment.   Additional stickers are not available.   Please do not apply patch if you will be having a Nuclear Stress Test, Echocardiogram, Cardiac CT, MRI, or Chest Xray during the time frame you would be wearing the monitor. The patch cannot be worn during these tests.  You cannot remove and re-apply the ZIO XT patch monitor.   Your ZIO patch monitor will be sent USPS Priority mail from [facility] directly to your home address. The monitor may also be mailed to a PO BOX if home delivery is not available.   It may take 3-5 days to receive your monitor after you have been enrolled.   Once you have received you monitor, please review enclosed instructions.  Your monitor has already been registered assigning a specific monitor serial # to you.   Applying the monitor   Shave hair from upper left chest.   Hold abrader disc by orange tab.  Rub abrader in 40 strokes over left upper chest as indicated in your monitor instructions.   Clean area with 4 enclosed alcohol pads .  Use all pads to assure are is cleaned thoroughly.  Let dry.   Apply patch as indicated in monitor instructions.  Patch will be place under collarbone on left side of chest with arrow pointing upward.   Rub patch adhesive wings for 2 minutes.Remove white label marked "1".  Remove white label marked "2".  Rub patch adhesive wings for 2 additional minutes.   While looking in a mirror, press and release button in center of patch.  A small  green light will flash 3-4 times .  This will be your only indicator the monitor has been turned on.     Do not shower for the first 24 hours.  You may shower after the first 24 hours.   Press button if you feel a symptom. You will hear a small click.  Record Date, Time and Symptom in the Patient Log Book.   When you are ready to remove patch, follow instructions on last 2 pages of Patient Log Book.  Stick patch monitor onto last page of Patient Log Book.   Place Patient Log Book in [place] box.   Use locking tab on box and tape box closed securely.  The [employer] has [employer] on it.  Please place in mailbox as soon as possible.  Your physician should have your test results approximately 7 days after the monitor has been mailed back to [facility].   Call [facility] at [phone] if you have questions regarding your ZIO XT patch monitor.  Call them immediately if you see an orange light blinking on your monitor.   If your monitor falls off in less than 4 days contact our Monitor department at [phone].  If your monitor becomes loose or falls off after 4 days call [employer] at [phone] for suggestions on securing your monitor.

## 2019-06-07 LAB — BASIC METABOLIC PANEL
BUN/Creatinine Ratio: 20 (ref 10–24)
BUN: 17 mg/dL (ref 8–27)
CO2: 26 mmol/L (ref 20–29)
Calcium: 8.3 mg/dL — ABNORMAL LOW (ref 8.6–10.2)
Chloride: 101 mmol/L (ref 96–106)
Creatinine, Ser: 0.85 mg/dL (ref 0.76–1.27)
GFR calc Af Amer: 101 mL/min/{1.73_m2} (ref >59)
GFR calc non Af Amer: 88 mL/min/{1.73_m2} (ref >59)
Glucose: 82 mg/dL (ref 65–99)
Potassium: 3.7 mmol/L (ref 3.5–5.2)
Sodium: 142 mmol/L (ref 134–144)

## 2019-06-07 LAB — CBC
Hematocrit: 39.3 % (ref 37.5–51.0)
Hemoglobin: 12.9 g/dL — ABNORMAL LOW (ref 13.0–17.7)
MCH: 34 pg — ABNORMAL HIGH (ref 26.6–33.0)
MCHC: 32.8 g/dL (ref 31.5–35.7)
MCV: 104 fL — ABNORMAL HIGH (ref 79–97)
Platelets: 126 10*3/uL — ABNORMAL LOW (ref 150–450)
RBC: 3.79 x10E6/uL — ABNORMAL LOW (ref 4.14–5.80)
RDW: 14.5 % (ref 11.6–15.4)
WBC: 11.1 10*3/uL — ABNORMAL HIGH (ref 3.4–10.8)

## 2019-06-07 LAB — TSH: TSH: 1.83 u[IU]/mL (ref 0.450–4.500)

## 2019-06-08 ENCOUNTER — Encounter: Payer: Medicare Other | Admitting: Neurology

## 2019-06-08 ENCOUNTER — Ambulatory Visit (INDEPENDENT_AMBULATORY_CARE_PROVIDER_SITE_OTHER): Payer: Medicare Other

## 2019-06-08 DIAGNOSIS — R0609 Other forms of dyspnea: Secondary | ICD-10-CM

## 2019-06-08 DIAGNOSIS — R0600 Dyspnea, unspecified: Secondary | ICD-10-CM | POA: Diagnosis not present

## 2019-06-08 DIAGNOSIS — R002 Palpitations: Secondary | ICD-10-CM

## 2019-06-09 ENCOUNTER — Other Ambulatory Visit: Payer: Self-pay | Admitting: Internal Medicine

## 2019-06-09 DIAGNOSIS — C9100 Acute lymphoblastic leukemia not having achieved remission: Secondary | ICD-10-CM

## 2019-06-09 DIAGNOSIS — C9112 Chronic lymphocytic leukemia of B-cell type in relapse: Secondary | ICD-10-CM

## 2019-06-09 DIAGNOSIS — C9192 Lymphoid leukemia, unspecified, in relapse: Secondary | ICD-10-CM

## 2019-06-14 ENCOUNTER — Encounter: Payer: Self-pay | Admitting: Family Medicine

## 2019-06-14 ENCOUNTER — Ambulatory Visit (INDEPENDENT_AMBULATORY_CARE_PROVIDER_SITE_OTHER): Payer: Medicare Other | Admitting: Family Medicine

## 2019-06-14 ENCOUNTER — Other Ambulatory Visit: Payer: Self-pay

## 2019-06-14 VITALS — BP 139/85 | HR 77 | Temp 97.2°F | Ht 69.0 in | Wt 188.6 lb

## 2019-06-14 DIAGNOSIS — G620 Drug-induced polyneuropathy: Secondary | ICD-10-CM | POA: Diagnosis not present

## 2019-06-14 NOTE — Patient Instructions (Signed)
I am so happy you are doing better. MRI was unremarkable. Continue current treatment plan.   Follow up as needed   Peripheral Neuropathy Peripheral neuropathy is a type of nerve damage. It affects nerves that carry signals between the spinal cord and the arms, legs, and the rest of the body (peripheral nerves). It does not affect nerves in the spinal cord or brain. In peripheral neuropathy, one nerve or a group of nerves may be damaged. Peripheral neuropathy is a broad category that includes many specific nerve disorders, like diabetic neuropathy, hereditary neuropathy, and carpal tunnel syndrome. What are the causes? This condition may be caused by:  Diabetes. This is the most common cause of peripheral neuropathy.  Nerve injury.  Pressure or stress on a nerve that lasts a long time.  Lack (deficiency) of B vitamins. This can result from alcoholism, poor diet, or a restricted diet.  Infections.  Autoimmune diseases, such as rheumatoid arthritis and systemic lupus erythematosus.  Nerve diseases that are passed from parent to child (inherited).  Some medicines, such as cancer medicines (chemotherapy).  Poisonous (toxic) substances, such as lead and mercury.  Too little blood flowing to the legs.  Kidney disease.  Thyroid disease. In some cases, the cause of this condition is not known. What are the signs or symptoms? Symptoms of this condition depend on which of your nerves is damaged. Common symptoms include:  Loss of feeling (numbness) in the feet, hands, or both.  Tingling in the feet, hands, or both.  Burning pain.  Very sensitive skin.  Weakness.  Not being able to move a part of the body (paralysis).  Muscle twitching.  Clumsiness or poor coordination.  Loss of balance.  Not being able to control your bladder.  Feeling dizzy.  Sexual problems. How is this diagnosed? Diagnosing and finding the cause of peripheral neuropathy can be difficult. Your health  care provider will take your medical history and do a physical exam. A neurological exam will also be done. This involves checking things that are affected by your brain, spinal cord, and nerves (nervous system). For example, your health care provider will check your reflexes, how you move, and what you can feel. You may have other tests, such as:  Blood tests.  Electromyogram (EMG) and nerve conduction tests. These tests check nerve function and how well the nerves are controlling the muscles.  Imaging tests, such as CT scans or MRI to rule out other causes of your symptoms.  Removing a small piece of nerve to be examined in a lab (nerve biopsy). This is rare.  Removing and examining a small amount of the fluid that surrounds the brain and spinal cord (lumbar puncture). This is rare. How is this treated? Treatment for this condition may involve:  Treating the underlying cause of the neuropathy, such as diabetes, kidney disease, or vitamin deficiencies.  Stopping medicines that can cause neuropathy, such as chemotherapy.  Medicine to relieve pain. Medicines may include: ? Prescription or over-the-counter pain medicine. ? Antiseizure medicine. ? Antidepressants. ? Pain-relieving patches that are applied to painful areas of skin.  Surgery to relieve pressure on a nerve or to destroy a nerve that is causing pain.  Physical therapy to help improve movement and balance.  Devices to help you move around (assistive devices). Follow these instructions at home: Medicines  Take over-the-counter and prescription medicines only as told by your health care provider. Do not take any other medicines without first asking your health care provider.  Do not drive or use heavy machinery while taking prescription pain medicine. Lifestyle   Do not use any products that contain nicotine or tobacco, such as cigarettes and e-cigarettes. Smoking keeps blood from reaching damaged nerves. If you need  help quitting, ask your health care provider.  Avoid or limit alcohol. Too much alcohol can cause a vitamin B deficiency, and vitamin B is needed for healthy nerves.  Eat a healthy diet. This includes: ? Eating foods that are high in fiber, such as fresh fruits and vegetables, whole grains, and beans. ? Limiting foods that are high in fat and processed sugars, such as fried or sweet foods. General instructions   If you have diabetes, work closely with your health care provider to keep your blood sugar under control.  If you have numbness in your feet: ? Check every day for signs of injury or infection. Watch for redness, warmth, and swelling. ? Wear padded socks and comfortable shoes. These help protect your feet.  Develop a good support system. Living with peripheral neuropathy can be stressful. Consider talking with a mental health specialist or joining a support group.  Use assistive devices and attend physical therapy as told by your health care provider. This may include using a walker or a cane.  Keep all follow-up visits as told by your health care provider. This is important. Contact a health care provider if:  You have new signs or symptoms of peripheral neuropathy.  You are struggling emotionally from dealing with peripheral neuropathy.  Your pain is not well-controlled. Get help right away if:  You have an injury or infection that is not healing normally.  You develop new weakness in an arm or leg.  You fall frequently. Summary  Peripheral neuropathy is when the nerves in the arms, or legs are damaged, resulting in numbness, weakness, or pain.  There are many causes of peripheral neuropathy, including diabetes, pinched nerves, vitamin deficiencies, autoimmune disease, and hereditary conditions.  Diagnosing and finding the cause of peripheral neuropathy can be difficult. Your health care provider will take your medical history, do a physical exam, and do tests,  including blood tests and nerve function tests.  Treatment involves treating the underlying cause of the neuropathy and taking medicines to help control pain. Physical therapy and assistive devices may also help. This information is not intended to replace advice given to you by your health care provider. Make sure you discuss any questions you have with your health care provider. Document Revised: 04/10/2017 Document Reviewed: 07/07/2016 Elsevier Patient Education  2020 [employer].

## 2019-06-14 NOTE — Progress Notes (Signed)
PATIENT: [name] DOB: April 06, 1951  REASON FOR VISIT: follow up HISTORY FROM: patient  Chief Complaint  Patient presents with  . Follow-up    RM2. Alone.     HISTORY OF PRESENT ILLNESS: Today 06/14/19 [name] is a 69 y.o. male here today for follow up follow up regarding continued balance difficulties related to chronic HIV related polyneuropathy. Recent MRI with [name] was unremarkable and did not indicate any specific cause of balance difficulty. She also reviewed NCS/EMG from previous providers consistent with retroviral related neuropathy. He feels that gait has improved somewhat. He continues to have difficulty lifting left leg. He reports not being able to lift his leg when in a chair but is able to lift it to walk. Today he is walking with a single prong cane. He is able to drive again. He continues to push himself to be active. He was advised to consider PT but has been hesitant. He does not feel this will help.    HISTORY: (copied from [name]'s note on 04/13/2019)  HPI:  [name] is a 69 y.o. male seen on 04-13-2019 upon a referral from Christopher. Saintclair [name] for [facility] . after having seen neurologist Christopher. Tomi [name] already since 2014. [name] is a 40 year oldright-handed Caucasian man with rheumatoid arthritis, HIV, peripheral neuropathy and history of TIA, recurrent DVT and lumbar spine surgery who presents for balance problems. History supplemented by referring provider note. His PCP is not aware of his referral. [name] has treated him for thiamine deficiency ( patient is not a drinker he insisted) , he had 4 hip procedures, and didn't recover after wards.  He has lost muscle mass progressivly since 2019. Balance is off since. Eye-movements are off. He has been diagnosed with HIV in 1993, 27 years on retrovirals. This medication is known to cause neuropathy and there is primary HIV neuropathy as well. Christopher [name] follows him.  [name] reports  today that 3 month ago his legs just quit working, having to this day a horrible time walking.  His neurosurgeon by whom he is followed for scoliosis ordered a CT of his spine and could not find an explanation for the acute onset of walking difficulties.  He sent him also for EMG and nerve conduction studies.  He has a s/p thoracolumbar fusion CT scan of the thoracolumbar spine did not show what appears to be any solid fusions nor signs of complication such as loosening of his hardware but significant kyphotic deformity results.  EMG was performed and quoted here as showing profound peripheral polyneuropathy which would be a diagnosis obtained by nerve conduction studies, not EMG.  I have no access to the original reports of the original studies.   REVIEW OF SYSTEMS: Out of a complete 14 system review of symptoms, the patient complains only of the following symptoms, chronic pain, left weakness, imbalance, and all other reviewed systems are negative.  ALLERGIES: Allergies  Allergen Reactions  . Golimumab Anaphylaxis    Simponi ARIA  . Orencia [Abatacept] Anaphylaxis  . Other Anaphylaxis and Hives    Pecans  . Peanut-Containing Drug Products Anaphylaxis, Hives and Swelling    Swelling of throat  . Morphine Other (See Comments)    Severe headache- Can tolerate Dilaudid, however  . Oxycodone-Acetaminophen Other (See Comments)    Headache 6.22.2020 patient is currently taking and tolerating  . Penicillins Rash and Other (See Comments)    FLUSHED & RED, also Has  patient had a PCN reaction causing immediate rash, facial/tongue/throat swelling, SOB or lightheadedness with hypotension: Yes Has patient had a PCN reaction causing severe rash involving mucus membranes or skin necrosis: No Has patient had a PCN reaction that required hospitalization: No Has patient had a PCN reaction occurring within the last 10 years: No If all of the above answers are "NO", then may proceed with Cephalosporin use    . Promethazine Hcl Other (See Comments)    Makes him feel "drunk" at higher strengths    HOME MEDICATIONS: Outpatient Medications Prior to Visit  Medication Sig Dispense Refill  . acetaminophen (TYLENOL) 500 MG tablet Take 500-1,000 mg by mouth every 6 (six) hours as needed for mild pain or moderate pain.     [name] atorvastatin (LIPITOR) 10 MG tablet TAKE 1 TABLET BY MOUTH AT BEDTIME 30 tablet 9  . clindamycin (CLEOCIN) 300 MG capsule Take two by mouth one hour before dental appointment, then two by mouth six hours after appointment 12 capsule 0  . darunavir (PREZISTA) 800 MG tablet Take 1 tablet (800 mg total) by mouth daily. 30 tablet 5  . elvitegravir-cobicistat-emtricitabine-tenofovir (GENVOYA) 150-150-200-10 MG TABS tablet Take 1 tablet by mouth daily with breakfast. 30 tablet 5  . EPINEPHrine (EPIPEN 2-PAK) 0.3 mg/0.3 mL IJ SOAJ injection Inject 0.3 mg into the muscle as needed for anaphylaxis.    [name] escitalopram (LEXAPRO) 20 MG tablet TAKE 1 TABLET BY MOUTH EVERY DAY 30 tablet 5  . famotidine (PEPCID) 40 MG tablet TAKE 1 TABLET BY MOUTH TWICE A DAY 99991111 tablet 0  . folic acid (FOLVITE) 1 MG tablet Take 1 mg by mouth daily.  11  . HYDROcodone-acetaminophen (NORCO) 7.5-325 MG tablet Take 1 tablet by mouth every 6 (six) hours as needed for severe pain (pain score 7-10). 40 tablet 0  . leflunomide (ARAVA) 20 MG tablet Take 20 mg by mouth daily.     . metoprolol succinate (TOPROL-XL) 50 MG 24 hr tablet Take 1 tablet (50 mg total) by mouth daily. 90 tablet 3  . nitroGLYCERIN (NITROSTAT) 0.4 MG SL tablet Place 1 tablet (0.4 mg total) under the tongue every 5 (five) minutes as needed for chest pain. 25 tablet 2  . ondansetron (ZOFRAN) 4 MG tablet Take 4 mg by mouth every 6 (six) hours as needed for nausea.    . predniSONE (DELTASONE) 5 MG tablet Take 5 mg by mouth 2 (two) times daily.   1  . pregabalin (LYRICA) 200 MG capsule TAKE 1 CAPSULE BY MOUTH TWICE A DAY 60 capsule 5  . SAVAYSA 30 MG TABS  tablet TAKE 1 TABLET (30 MG TOTAL) BY MOUTH DAILY. 30 tablet 5  . sulfamethoxazole-trimethoprim (BACTRIM DS) 800-160 MG tablet Take 1 tablet by mouth every 12 (twelve) hours. 60 tablet 3  . torsemide (DEMADEX) 20 MG tablet TAKE 1 TABLET BY MOUTH EVERY DAY 90 tablet 1  . valACYclovir (VALTREX) 500 MG tablet TAKE 1 TABLET BY MOUTH EVERY DAY 30 tablet 2   No facility-administered medications prior to visit.    PAST MEDICAL HISTORY: Past Medical History:  Diagnosis Date  . Allergy   . Anxiety   . Carotid artery occlusion    40-60% right ICA stenosis (09/2008)  . Cataract   . Chronic back pain   . CLL (chronic lymphoblastic leukemia) dx 2010   Followed at [facility] q24mo, no current therapy   . Clotting disorder ([place])   . Coronary artery disease 2010   s/p CABG '10, sees  Christopher. Percival Spanish  . Depression   . Diverticulosis   . DVT, lower extremity, recurrent ([place]) 2008, 2009   LLE, chronic anticoag since 2009  . Esophagitis   . Fibromyalgia   . Gallstones   . GERD (gastroesophageal reflux disease)   . Gout   . Gynecomastia, male   . H/O hiatal hernia 2008   surgery  . Hemorrhoids   . Hepatitis A yrs ago  . HIV infection ([place]) dx 1993  . Hypertension   . Impotence of organic origin   . Myocardial infarction ([place]) 2010    x 2  . Neuromuscular disorder ([facility])    neuropathy  . Osteoarthritis, knee    s/p B TKA  . Osteoporosis   . Rheumatoid arthritis(714.0) dx 2010   MTX, follows with rheum  . Seasonal allergies   . Secondary syphilis 07/24/14 dx   s/p 2 wks doxy  . Status post dilation of esophageal narrowing   . TIA (transient ischemic attack) 1997   mild residual L mouth droop  . Tubular adenoma of colon     PAST SURGICAL HISTORY: Past Surgical History:  Procedure Laterality Date  . ANTERIOR HIP REVISION Left 08/17/2018   Procedure: ANTERIOR LEFT HIP REVISION;  Surgeon: Mcarthur Rossetti, MD;  Location: [facility];  Service: Orthopedics;  Laterality: Left;  . ANTERIOR HIP  REVISION Left 11/02/2018   Procedure: LEFT HIP CONSTRAINED LINER REVISION;  Surgeon: Mcarthur Rossetti, MD;  Location: [facility];  Service: Orthopedics;  Laterality: Left;  . CHOLECYSTECTOMY    . COLONOSCOPY WITH PROPOFOL N/A 12/28/2012   Procedure: COLONOSCOPY WITH PROPOFOL;  Surgeon: Jerene Bears, MD;  [facility];  Service: Gastroenterology;  Laterality: N/A;  . CORONARY ARTERY BYPASS GRAFT  2010   triple bypass  . ESOPHAGOGASTRODUODENOSCOPY (EGD) WITH PROPOFOL N/A 12/28/2012   Procedure: ESOPHAGOGASTRODUODENOSCOPY (EGD) WITH PROPOFOL;  Surgeon: Jerene Bears, MD;  [facility];  Service: Gastroenterology;  Laterality: N/A;  . ESOPHAGOGASTRODUODENOSCOPY (EGD) WITH PROPOFOL N/A 03/15/2013   Procedure: ESOPHAGOGASTRODUODENOSCOPY (EGD) WITH PROPOFOL;  Surgeon: Jerene Bears, MD;  [facility];  Service: Gastroenterology;  Laterality: N/A;  . ESOPHAGOGASTRODUODENOSCOPY (EGD) WITH PROPOFOL N/A 02/07/2016   Procedure: ESOPHAGOGASTRODUODENOSCOPY (EGD) WITH PROPOFOL;  Surgeon: Jerene Bears, MD;  [facility];  Service: Gastroenterology;  Laterality: N/A;  . HARDWARE REMOVAL N/A 07/02/2012   Procedure: HARDWARE REMOVAL;  Surgeon: Elaina Hoops, MD;  Location: [facility];  Service: Neurosurgery;  Laterality: N/A;  . HIATAL HERNIA REPAIR     wrap  . HIP CLOSED REDUCTION Left 08/15/2018   Procedure: CLOSED REDUCTION HIP;  Surgeon: Newt Minion, MD;  Location: [facility];  Service: Orthopedics;  Laterality: Left;  . HIP CLOSED REDUCTION Left 08/28/2018   Procedure: CLOSED REDUCTION HIP FOR RECURRENT DISLOCATION AND DRESSING CHANGE;  Surgeon: Jessy Oto, MD;  Location: [facility];  Service: Orthopedics;  Laterality: Left;  . HIP CLOSED REDUCTION Left 09/15/2018   Procedure: CLOSED REDUCTION HIP DISLOCATION;  Surgeon: Mcarthur Rossetti, MD;  Location: [facility];  Service: Orthopedics;  Laterality: Left;  . HIP CLOSED REDUCTION Left 10/31/2018   Procedure: CLOSED REDUCTION LEFT  TOTAL HIP;  Surgeon: Leandrew Koyanagi, MD;  Location: [facility];  Service: Orthopedics;  Laterality: Left;  . INGUINAL HERNIA REPAIR Bilateral   . JOINT REPLACEMENT Left 1999  . KNEE ARTHROPLASTY  07/22/2011   Procedure: COMPUTER ASSISTED TOTAL KNEE ARTHROPLASTY;  Surgeon: Meredith Pel, MD;  Location: [facility];  Service: Orthopedics;  Laterality: Right;  Right total knee arthroplasty  . MANDIBLE SURGERY Bilateral    tmj  . REPLACEMENT TOTAL KNEE Bilateral   . ring around testicle hernia reapir  184 and 1986   x 2  . ROTATOR CUFF REPAIR Right   . SHOULDER SURGERY Left   . SPINE SURGERY  2010   "rod and screws", "failed", lopwer spine,   . stent to heart x 1  2010  . TONSILLECTOMY    . TOTAL HIP ARTHROPLASTY Left 08/13/2018   Procedure: LEFT TOTAL HIP ARTHROPLASTY ANTERIOR APPROACH;  Surgeon: Mcarthur Rossetti, MD;  Location: [facility];  Service: Orthopedics;  Laterality: Left;  . TOTAL HIP ARTHROPLASTY Left 09/17/2018   Procedure: ANTERIOR LEFT HIP REVISION;  Surgeon: Mcarthur Rossetti, MD;  Location: [facility];  Service: Orthopedics;  Laterality: Left;  . UMBILICAL HERNIA REPAIR     x 1  . varicose vein     stripping  . VIDEO BRONCHOSCOPY Bilateral 10/16/2015   Procedure: VIDEO BRONCHOSCOPY WITHOUT FLUORO;  Surgeon: Brand Males, MD;  [facility];  Service: Cardiopulmonary;  Laterality: Bilateral;    FAMILY HISTORY: Family History  Problem Relation Age of Onset  . Breast cancer Mother   . Hypertension Mother   . Hyperlipidemia Mother   . Diabetes Mother   . Prostate cancer Father   . Colon polyps Father   . Hyperlipidemia Father   . Crohn's disease Paternal Aunt   . Diabetes Maternal Grandmother   . Diabetes Brother        x 3  . Heart attack Brother   . Heart disease Brother        x 3  . Heart attack Brother   . Hyperlipidemia Brother        x 3  . Alcohol abuse Daughter   . Drug abuse Daughter   . Asthma Brother   . CVA Brother   . Colon cancer Neg Hx     . Esophageal cancer Neg Hx   . Rectal cancer Neg Hx   . Stomach cancer Neg Hx     SOCIAL HISTORY: Social History   Socioeconomic History  . Marital status: Widowed    Spouse name: Not on file  . Number of children: 3  . Years of education: Not on file  . Highest education level: Bachelor's degree (e.g., BA, AB, BS)  Occupational History  . Occupation: retired  Tobacco Use  . Smoking status: Never Smoker  . Smokeless tobacco: Never Used  . Tobacco comment: occ wine  Substance and Sexual Activity  . Alcohol use: Yes    Comment: occasional wine- 1-2 per week  . Drug use: No  . Sexual activity: Not Currently    Comment: declined condoms  Other Topics Concern  . Not on file  Social History Narrative   Patient is right-handed. He is a widower. He lives alone in a single level home. He is active around his home. He drinks 2-3 cups of coffee a day.   Social Determinants of Health   Financial Resource Strain:   . Difficulty of Paying Living Expenses: Not on file  Food Insecurity:   . Worried About Charity fundraiser in the Last Year: Not on file  . Ran Out of Food in the Last Year: Not on file  Transportation Needs:   . Lack of Transportation (Medical): Not on file  . Lack of Transportation (Non-Medical): Not on file  Physical Activity:   .  Days of Exercise per Week: Not on file  . Minutes of Exercise per Session: Not on file  Stress:   . Feeling of Stress : Not on file  Social Connections:   . Frequency of Communication with Friends and Family: Not on file  . Frequency of Social Gatherings with Friends and Family: Not on file  . Attends Religious Services: Not on file  . Active Member of Clubs or Organizations: Not on file  . Attends Archivist Meetings: Not on file  . Marital Status: Not on file  Intimate Partner Violence:   . Fear of Current or Ex-Partner: Not on file  . Emotionally Abused: Not on file  . Physically Abused: Not on file  . Sexually  Abused: Not on file      PHYSICAL EXAM  Vitals:   06/14/19 1249  BP: 139/85  Pulse: 77  Temp: (!) 97.2 F (36.2 C)  Weight: 188 lb 9.6 oz (85.5 kg)  Height: 5\' 9"  (1.753 m)   Body mass index is 27.85 kg/m.  Generalized: Well developed, in no acute distress  Neurological examination  Mentation: Alert oriented to time, place, history taking. Follows all commands speech and language fluent Cranial nerve II-XII: Pupils were equal round reactive to light. Extraocular movements were full, visual field were full  Motor: The motor testing reveals 5 over 5 strength of bilateral upper extremities with flexion, 4/5 with extension (patient reports shoulder pain). 4+/5 of right hip flexion and leg extension, 2/5 left hip flexion, 5/5 left leg extension. Atrophy noted of all 4 extremities, arthritic changes of bilateral hands and fingers Sensory: Sensory testing is intact to soft touch on all 4 extremities. No evidence of extinction is noted.  Coordination: Cerebellar testing reveals good finger-nose-finger and heel-to-shin bilaterally.  Gait and station: Gait is arthritic but stable with single prong cane, stooped posture, able to push up with both arms from seated position. Tandem gait not attempted  DIAGNOSTIC DATA (LABS, IMAGING, TESTING) - I reviewed patient records, labs, notes, testing and imaging myself where available.  MMSE - Mini Mental State Exam 07/29/2018  Orientation to time 5  Orientation to Place 5  Registration 3  Attention/ Calculation 5  Recall 2  Language- name 2 objects 2  Language- repeat 1  Language- follow 3 step command 3  Language- read & follow direction 1  Write a sentence 1  Copy design 1  Total score 29     Lab Results  Component Value Date   WBC 11.1 (H) 06/06/2019   HGB 12.9 (L) 06/06/2019   HCT 39.3 06/06/2019   MCV 104 (H) 06/06/2019   PLT 126 (L) 06/06/2019      Component Value Date/Time   NA 142 06/06/2019 1156   NA 140 08/13/2016 0919    K 3.7 06/06/2019 1156   K 3.7 08/13/2016 0919   CL 101 06/06/2019 1156   CO2 26 06/06/2019 1156   CO2 27 08/13/2016 0919   GLUCOSE 82 06/06/2019 1156   GLUCOSE 108 (H) 12/16/2018 0902   GLUCOSE 86 08/13/2016 0919   BUN 17 06/06/2019 1156   BUN 18.7 08/13/2016 0919   CREATININE 0.85 06/06/2019 1156   CREATININE 0.80 12/16/2018 0902   CREATININE 0.83 10/28/2018 1104   CREATININE 1.1 08/13/2016 0919   CALCIUM 8.3 (L) 06/06/2019 1156   CALCIUM 8.8 08/13/2016 0919   PROT 6.0 (L) 12/16/2018 0902   PROT 5.9 (L) 08/13/2016 0919   ALBUMIN 3.0 (L) 12/16/2018 0902   ALBUMIN  3.9 08/13/2016 0919   AST 12 (L) 12/16/2018 0902   AST 21 08/13/2016 0919   ALT 9 12/16/2018 0902   ALT 18 08/13/2016 0919   ALKPHOS 133 (H) 12/16/2018 0902   ALKPHOS 114 08/13/2016 0919   BILITOT 0.3 12/16/2018 0902   BILITOT 0.59 08/13/2016 0919   GFRNONAA 88 06/06/2019 1156   GFRNONAA >60 12/16/2018 0902   GFRNONAA 67 07/09/2017 0913   GFRAA 101 06/06/2019 1156   GFRAA >60 12/16/2018 0902   GFRAA 77 07/09/2017 0913   Lab Results  Component Value Date   CHOL 162 10/28/2018   HDL 35 (L) 10/28/2018   LDLCALC 102 (H) 10/28/2018   TRIG 152 (H) 10/28/2018   CHOLHDL 4.6 10/28/2018   Lab Results  Component Value Date   HGBA1C 5.1 11/16/2018   Lab Results  Component Value Date   [id number] 11/16/2018   Lab Results  Component Value Date   TSH 1.830 06/06/2019       ASSESSMENT AND PLAN 69 y.o. year old male  has a past medical history of Allergy, Anxiety, Carotid artery occlusion, Cataract, Chronic back pain, CLL (chronic lymphoblastic leukemia) (dx 2010), Clotting disorder ([place]), Coronary artery disease (2010), Depression, Diverticulosis, DVT, lower extremity, recurrent ([place]) (2008, 2009), Esophagitis, Fibromyalgia, Gallstones, GERD (gastroesophageal reflux disease), Gout, Gynecomastia, male, H/O hiatal hernia (2008), Hemorrhoids, Hepatitis A (yrs ago), HIV infection ([place]) (dx 1993), Hypertension,  Impotence of organic origin, Myocardial infarction ([place]) (2010), Neuromuscular disorder ([place]), Osteoarthritis, knee, Osteoporosis, Rheumatoid arthritis(714.0) (dx 2010), Seasonal allergies, Secondary syphilis (07/24/14 dx), Status post dilation of esophageal narrowing, TIA (transient ischemic attack) (1997), and Tubular adenoma of colon. here with     ICD-10-CM   1. Drug-induced polyneuropathy ([place])  G62.0     Phil does report improvement in strength and balance. He is now able to walk with single prong cane and is driving without difficulty. MRI did not reveal specific etiology to explain acute onset of imbalance. We have discussed trying PT for strength training but he is hesitant stating that it will not help. He continues Lyrica managed by PCP with relief of neuropathic pain. He will continue close follow up as directed with PCP, ortho, cardiology, rheumatology and ID. He will return for worsening symptoms. He verbalizes understanding and agreement with this plan.    No orders of the defined types were placed in this encounter.    No orders of the defined types were placed in this encounter.     I spent 15 minutes with the patient. 50% of this time was spent counseling and educating patient on plan of care and medications.    Debbora Presto, FNP-C 06/14/2019, 1:08 PM [facility] [address], [place], [place] [address] [phone]

## 2019-06-15 ENCOUNTER — Encounter: Payer: Self-pay | Admitting: Internal Medicine

## 2019-06-20 DIAGNOSIS — R002 Palpitations: Secondary | ICD-10-CM | POA: Diagnosis not present

## 2019-06-23 ENCOUNTER — Ambulatory Visit (INDEPENDENT_AMBULATORY_CARE_PROVIDER_SITE_OTHER): Payer: Medicare Other

## 2019-06-23 ENCOUNTER — Ambulatory Visit: Payer: Medicare Other | Attending: Internal Medicine

## 2019-06-23 ENCOUNTER — Other Ambulatory Visit: Payer: Self-pay

## 2019-06-23 ENCOUNTER — Ambulatory Visit (INDEPENDENT_AMBULATORY_CARE_PROVIDER_SITE_OTHER): Payer: Medicare Other | Admitting: Orthopaedic Surgery

## 2019-06-23 ENCOUNTER — Encounter: Payer: Self-pay | Admitting: Orthopaedic Surgery

## 2019-06-23 DIAGNOSIS — Z96642 Presence of left artificial hip joint: Secondary | ICD-10-CM

## 2019-06-23 DIAGNOSIS — Z23 Encounter for immunization: Secondary | ICD-10-CM | POA: Insufficient documentation

## 2019-06-23 DIAGNOSIS — I2510 Atherosclerotic heart disease of native coronary artery without angina pectoris: Secondary | ICD-10-CM

## 2019-06-23 NOTE — Progress Notes (Signed)
   Covid-19 Vaccination Clinic  Name:  [name]    MRN: [id number] DOB: Apr 22, 1951  06/23/2019  [name] was observed post Covid-19 immunization for 15 minutes without incidence. He was provided with Vaccine Information Sheet and instruction to access the V-Safe system.   [name] was instructed to call 911 with any severe reactions post vaccine: [name] Difficulty breathing  . Swelling of your face and throat  . A fast heartbeat  . A bad rash all over your body  . Dizziness and weakness    Immunizations Administered    Name Date Dose VIS Date Route   Pfizer COVID-19 Vaccine 06/23/2019  8:41 AM 0.3 mL 04/22/2019 Intramuscular   Manufacturer: [place]   Lot: [id number]   [place]: [id number]

## 2019-06-23 NOTE — Progress Notes (Signed)
Office Visit Note   Patient: [name]           Date of Birth: 03/15/1951           MRN: [id number] Visit Date: 06/23/2019              Requested by: [name], MD [address] [place],  [place] [address] PCP: [name], MD   Assessment & Plan: Visit Diagnoses:  1. History of revision of total replacement of left hip joint     Plan: I still want him to minimalize his mobility due to the issues with his left hip.  I would like to see him back in 6 months for repeat pelvis and hip films.  This would be a standing low AP pelvis and a lateral of his left hip.  If there are any issues before then he knows to let us know.  Follow-Up Instructions: Return in about 6 months (around 12/21/2019).   Orders:  Orders Placed This Encounter  Procedures  . XR HIP UNILAT W OR W/O PELVIS 1V LEFT   No orders of the defined types were placed in this encounter.     Procedures: No procedures performed   Clinical Data: No additional findings.   Subjective: Chief Complaint  Patient presents with  . Left Hip - Follow-up  The patient comes in today for continued follow-up having had multiple surgeries on an unstable left hip replacement.  He is someone with severe lumbar kyphosis and fused spine to the sacrum and pelvis.  This made for quite difficult surgery.  Once he finally had a stable acetabular liner placed and he had a hard mechanical fall and this did cause some fracture lines around the acetabular component.  He reports that he is still ambulate with a cane he has some discomfort but overall feels like he is having less pain.  HPI  Review of Systems He currently denies any headache, chest pain, shortness of breath, fever, chills, nausea, vomiting  Objective: Vital Signs: There were no vitals taken for this visit.  Physical Exam He is alert and orient x3 and in no acute distress Ortho Exam Examination of his left hip does show it moves smoothly and is  clinically well located. Specialty Comments:  No specialty comments available.  Imaging: XR HIP UNILAT W OR W/O PELVIS 1V LEFT  Result Date: 06/23/2019 2 supine views of the left hip and pelvis show a total hip arthroplasty that is still in place.  There is periosteal reaction around the acetabular component from multiple surgeries.    PMFS History: Patient Active Problem List   Diagnosis Date Noted  . Drug-induced polyneuropathy ([place]) 06/14/2019  . Educated about COVID-19 virus infection 06/05/2019  . Personal history of TIA (transient ischemic attack) 04/13/2019  . HIV 2 as the cause of diseases classified elsewhere ([place]) 04/13/2019  . HIV-1 associated autonomic neuropathy ([place]) 04/13/2019  . Ataxia 04/13/2019  . Impaired functional mobility, balance, gait, and endurance 04/13/2019  . Cognitive changes 04/13/2019  . Cerebellar ataxia in diseases classified elsewhere ([place]) 04/13/2019  . Acute lymphoblastic leukemia not having achieved remission ([place]) 11/23/2018  . Recurrent major depressive disorder, in partial remission ([facility] [place]) 11/23/2018  . Atherosclerosis of coronary artery bypass graft(s), unspecified, with other forms of angina pectoris ([place]) 11/23/2018  . Murmur, cardiac 11/23/2018  . Edema 11/18/2018  . Iron deficiency anemia due to dietary causes 11/17/2018  . Bilateral leg edema 11/16/2018  . Failure of right  total hip arthroplasty with dislocation of hip ([place])   . Dislocation of hip prosthesis ([place])   . Status post total replacement of left hip 08/13/2018  . Avascular necrosis of bone of hip, left ([place]) 08/09/2018  . Eczema 07/01/2018  . Candida rash of groin 07/01/2018  . Tinea corporis 01/22/2018  . Depression with anxiety 01/14/2018  . 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 Carotid artery disease ([place]) 11/13/2016  . Stenosis of carotid artery 11/13/2016  . Spinal stenosis of thoracolumbar region 07/02/2016  . ILD (interstitial lung disease) ([place]) 09/13/2015   . Immunocompromised state ([place]) 09/13/2015  . Tinea cruris 09/12/2015  . PCP (pneumocystis jiroveci pneumonia) ([place]) 06/18/2015  . Postinflammatory pulmonary fibrosis ([place]) 05/11/2015  . GERD (gastroesophageal reflux disease)   . Hereditary and idiopathic peripheral neuropathy 09/08/2013  . Erosive esophagitis 12/28/2012  . Allergic rhinitis, cause unspecified 04/28/2012  . Long term current use of anticoagulant therapy 02/03/2012  . Hypertension   . DVT, lower extremity, recurrent ([place])   . Gout   . Carotid artery occlusion   . Hyperlipidemia with target LDL less than 100   . HIV infection ([place]) 04/08/2011  . Arthritis, rheumatoid ([place]) 04/08/2011  . CLL (chronic lymphoid leukemia) in relapse ([place]) 04/08/2011  . Type 2 diabetes mellitus with diabetic polyneuropathy, without long-term current use of insulin ([place]) 04/02/2011  . Impotence of organic origin 04/02/2011  . CAD (coronary artery disease) of artery bypass graft 02/14/2009  . Herpes genitalis 06/22/2008   Past Medical History:  Diagnosis Date  . Allergy   . Anxiety   . Carotid artery occlusion    40-60% right ICA stenosis (09/2008)  . Cataract   . Chronic back pain   . CLL (chronic lymphoblastic leukemia) dx 2010   Followed at [facility] q14mo, no current therapy   . Clotting disorder ([place])   . Coronary artery disease 2010   s/p CABG '10, sees Dr. Percival Spanish  . Depression   . Diverticulosis   . DVT, lower extremity, recurrent ([place]) 2008, 2009   LLE, chronic anticoag since 2009  . Esophagitis   . Fibromyalgia   . Gallstones   . GERD (gastroesophageal reflux disease)   . Gout   . Gynecomastia, male   . H/O hiatal hernia 2008   surgery  . Hemorrhoids   . Hepatitis A yrs ago  . HIV infection ([place]) dx 1993  . Hypertension   . Impotence of organic origin   . Myocardial infarction ([place]) 2010    x 2  . Neuromuscular disorder ([facility])    neuropathy  . Osteoarthritis, knee    s/p B TKA  . Osteoporosis   . Rheumatoid  arthritis(714.0) dx 2010   MTX, follows with rheum  . Seasonal allergies   . Secondary syphilis 07/24/14 dx   s/p 2 wks doxy  . Status post dilation of esophageal narrowing   . TIA (transient ischemic attack) 1997   mild residual L mouth droop  . Tubular adenoma of colon     Family History  Problem Relation Age of Onset  . Breast cancer Mother   . Hypertension Mother   . Hyperlipidemia Mother   . Diabetes Mother   . Prostate cancer Father   . Colon polyps Father   . Hyperlipidemia Father   . Crohn's disease Paternal Aunt   . Diabetes Maternal Grandmother   . Diabetes Brother        x 3  . Heart attack Brother   . Heart disease Brother  x 3  . Heart attack Brother   . Hyperlipidemia Brother        x 3  . Alcohol abuse Daughter   . Drug abuse Daughter   . Asthma Brother   . CVA Brother   . Colon cancer Neg Hx   . Esophageal cancer Neg Hx   . Rectal cancer Neg Hx   . Stomach cancer Neg Hx     Past Surgical History:  Procedure Laterality Date  . ANTERIOR HIP REVISION Left 08/17/2018   Procedure: ANTERIOR LEFT HIP REVISION;  Surgeon: Mcarthur Rossetti, MD;  Location: [facility];  Service: Orthopedics;  Laterality: Left;  . ANTERIOR HIP REVISION Left 11/02/2018   Procedure: LEFT HIP CONSTRAINED LINER REVISION;  Surgeon: Mcarthur Rossetti, MD;  Location: [facility];  Service: Orthopedics;  Laterality: Left;  . CHOLECYSTECTOMY    . COLONOSCOPY WITH PROPOFOL N/A 12/28/2012   Procedure: COLONOSCOPY WITH PROPOFOL;  Surgeon: Jerene Bears, MD;  [facility];  Service: Gastroenterology;  Laterality: N/A;  . CORONARY ARTERY BYPASS GRAFT  2010   triple bypass  . ESOPHAGOGASTRODUODENOSCOPY (EGD) WITH PROPOFOL N/A 12/28/2012   Procedure: ESOPHAGOGASTRODUODENOSCOPY (EGD) WITH PROPOFOL;  Surgeon: Jerene Bears, MD;  [facility];  Service: Gastroenterology;  Laterality: N/A;  . ESOPHAGOGASTRODUODENOSCOPY (EGD) WITH PROPOFOL N/A 03/15/2013   Procedure:  ESOPHAGOGASTRODUODENOSCOPY (EGD) WITH PROPOFOL;  Surgeon: Jerene Bears, MD;  [facility];  Service: Gastroenterology;  Laterality: N/A;  . ESOPHAGOGASTRODUODENOSCOPY (EGD) WITH PROPOFOL N/A 02/07/2016   Procedure: ESOPHAGOGASTRODUODENOSCOPY (EGD) WITH PROPOFOL;  Surgeon: Jerene Bears, MD;  [facility];  Service: Gastroenterology;  Laterality: N/A;  . HARDWARE REMOVAL N/A 07/02/2012   Procedure: HARDWARE REMOVAL;  Surgeon: Elaina Hoops, MD;  Location: [facility];  Service: Neurosurgery;  Laterality: N/A;  . HIATAL HERNIA REPAIR     wrap  . HIP CLOSED REDUCTION Left 08/15/2018   Procedure: CLOSED REDUCTION HIP;  Surgeon: Newt Minion, MD;  Location: [facility];  Service: Orthopedics;  Laterality: Left;  . HIP CLOSED REDUCTION Left 08/28/2018   Procedure: CLOSED REDUCTION HIP FOR RECURRENT DISLOCATION AND DRESSING CHANGE;  Surgeon: Jessy Oto, MD;  Location: [facility];  Service: Orthopedics;  Laterality: Left;  . HIP CLOSED REDUCTION Left 09/15/2018   Procedure: CLOSED REDUCTION HIP DISLOCATION;  Surgeon: Mcarthur Rossetti, MD;  Location: [facility];  Service: Orthopedics;  Laterality: Left;  . HIP CLOSED REDUCTION Left 10/31/2018   Procedure: CLOSED REDUCTION LEFT TOTAL HIP;  Surgeon: Leandrew Koyanagi, MD;  Location: [facility];  Service: Orthopedics;  Laterality: Left;  . INGUINAL HERNIA REPAIR Bilateral   . JOINT REPLACEMENT Left 1999  . KNEE ARTHROPLASTY  07/22/2011   Procedure: COMPUTER ASSISTED TOTAL KNEE ARTHROPLASTY;  Surgeon: Meredith Pel, MD;  Location: [name];  Service: Orthopedics;  Laterality: Right;  Right total knee arthroplasty  . MANDIBLE SURGERY Bilateral    tmj  . REPLACEMENT TOTAL KNEE Bilateral   . ring around testicle hernia reapir  184 and 1986   x 2  . ROTATOR CUFF REPAIR Right   . SHOULDER SURGERY Left   . SPINE SURGERY  2010   "rod and screws", "failed", lopwer spine,   . stent to heart x 1  2010  . TONSILLECTOMY    . TOTAL HIP ARTHROPLASTY Left 08/13/2018     Procedure: LEFT TOTAL HIP ARTHROPLASTY ANTERIOR APPROACH;  Surgeon: Mcarthur Rossetti, MD;  Location: [facility];  Service: Orthopedics;  Laterality: Left;  .  TOTAL HIP ARTHROPLASTY Left 09/17/2018   Procedure: ANTERIOR LEFT HIP REVISION;  Surgeon: Mcarthur Rossetti, MD;  Location: [facility];  Service: Orthopedics;  Laterality: Left;  . UMBILICAL HERNIA REPAIR     x 1  . varicose vein     stripping  . VIDEO BRONCHOSCOPY Bilateral 10/16/2015   Procedure: VIDEO BRONCHOSCOPY WITHOUT FLUORO;  Surgeon: Brand Males, MD;  [facility];  Service: Cardiopulmonary;  Laterality: Bilateral;   Social History   Occupational History  . Occupation: retired  Tobacco Use  . Smoking status: Never Smoker  . Smokeless tobacco: Never Used  . Tobacco comment: occ wine  Substance and Sexual Activity  . Alcohol use: Yes    Comment: occasional wine- 1-2 per week  . Drug use: No  . Sexual activity: Not Currently    Comment: declined condoms

## 2019-06-29 DIAGNOSIS — D485 Neoplasm of uncertain behavior of skin: Secondary | ICD-10-CM | POA: Diagnosis not present

## 2019-06-29 DIAGNOSIS — C44329 Squamous cell carcinoma of skin of other parts of face: Secondary | ICD-10-CM | POA: Diagnosis not present

## 2019-06-29 DIAGNOSIS — Z23 Encounter for immunization: Secondary | ICD-10-CM | POA: Diagnosis not present

## 2019-07-01 ENCOUNTER — Telehealth: Payer: Self-pay | Admitting: Cardiology

## 2019-07-01 NOTE — Telephone Encounter (Signed)
Spoke with patient. See result note.  

## 2019-07-01 NOTE — Telephone Encounter (Signed)
New message ° ° °Patient is returning call for heart monitor results. Please call. °

## 2019-07-11 ENCOUNTER — Encounter: Payer: Self-pay | Admitting: Internal Medicine

## 2019-07-12 ENCOUNTER — Encounter: Payer: Self-pay | Admitting: Internal Medicine

## 2019-07-12 ENCOUNTER — Ambulatory Visit (INDEPENDENT_AMBULATORY_CARE_PROVIDER_SITE_OTHER): Payer: Medicare Other | Admitting: Internal Medicine

## 2019-07-12 DIAGNOSIS — Z21 Asymptomatic human immunodeficiency virus [HIV] infection status: Secondary | ICD-10-CM

## 2019-07-12 DIAGNOSIS — J849 Interstitial pulmonary disease, unspecified: Secondary | ICD-10-CM | POA: Diagnosis not present

## 2019-07-12 DIAGNOSIS — Z20822 Contact with and (suspected) exposure to covid-19: Secondary | ICD-10-CM | POA: Diagnosis not present

## 2019-07-12 DIAGNOSIS — I2510 Atherosclerotic heart disease of native coronary artery without angina pectoris: Secondary | ICD-10-CM

## 2019-07-12 MED ORDER — DOXYCYCLINE HYCLATE 100 MG PO TABS: 100.0000 mg | ORAL_TABLET | Freq: Two times a day (BID) | ORAL | 0 refills | Status: DC

## 2019-07-12 MED ORDER — ALBUTEROL SULFATE (2.5 MG/3ML) 0.083% IN NEBU: 2.5000 mg | INHALATION_SOLUTION | Freq: Four times a day (QID) | RESPIRATORY_TRACT | 1 refills | Status: DC | PRN

## 2019-07-12 NOTE — Assessment & Plan Note (Signed)
Unclear what typical sats are with walking but suspect worse with this lung infection. Needs nebulizer and albuterol to use. Rx doxycycline 2 week course.

## 2019-07-12 NOTE — Assessment & Plan Note (Signed)
Awaiting test results. If positive would likely qualify for monoclonal antibody. If no improvement or worsening with doxycycline and albuterol nebulizer today will need visit at respiratory clinic.

## 2019-07-12 NOTE — Assessment & Plan Note (Signed)
Last CD4 count appropriate. Prior hx PJP pneumonia.

## 2019-07-12 NOTE — Progress Notes (Signed)
Virtual Visit via Video Note  I connected with [name] on 07/12/19 at  9:00 AM EST by a video enabled telemedicine application and verified that I am speaking with the correct person using two identifiers.  The patient and the provider were at separate locations throughout the entire encounter.   I discussed the limitations of evaluation and management by telemedicine and the availability of in person appointments. The patient expressed understanding and agreed to proceed. The patient and the provider were the only parties present for the visit unless noted in HPI below.  History of Present Illness: The patient is a 69 y.o. man with visit for cough with yellow phlegm. Started about 3-4 days ago. Has concurrent HIV and is on HAART therapy. Most recent CD4 count 681 10/2018. Has prior pneumonia and this feels same. O2 sats 94% with sitting and can decrease to 88% while walking. Had covid-19 test Sunday and is awaiting results of that. Denies fevers or chills. Having muscle aches. Having sinus drainage and pressure. Taking tylenol and chloracedin which is helping to dry that up some. Does have some SOB with exertion and previously had nebulizer during pneumonia which helped. Does not have that any longer. Overall it is not improving.   Observations/Objective: Appearance: cough sounds deep with some coarse sounds, breathing appears normal at rest, casual grooming, abdomen does not appear distended, throat not well visualized, memory normal, mental status is A and O times 3  Assessment and Plan: See problem oriented charting  Follow Up Instructions: rx doxycycline, nebulizer, albuterol solution. Asked patient to follow up with [place] with covid-19 result as he will likely qualify for monoclonal antibody if positive, if no improvement or if worsening needs to be seen in respiratory clinic this week  I discussed the assessment and treatment plan with the patient. The patient was provided an opportunity  to ask questions and all were answered. The patient agreed with the plan and demonstrated an understanding of the instructions.   Visit time 15 minutes in face to face communication with patient and coordination of care, additional 15 minutes spent in record review, coordination or care, ordering tests, communicating/referring to other healthcare professionals, documenting in medical records all on the same day of the visit for total time 30 minutes spent on the visit.   The patient was advised to call back or seek an in-person evaluation if the symptoms worsen or if the condition fails to improve as anticipated.  Hoyt Koch, MD

## 2019-07-13 ENCOUNTER — Other Ambulatory Visit: Payer: Self-pay | Admitting: Infectious Diseases

## 2019-07-13 DIAGNOSIS — B20 Human immunodeficiency virus [HIV] disease: Secondary | ICD-10-CM

## 2019-07-14 ENCOUNTER — Telehealth: Payer: Self-pay

## 2019-07-14 NOTE — Telephone Encounter (Signed)
Key: [id number]   PA approved.

## 2019-07-19 ENCOUNTER — Ambulatory Visit: Payer: Medicare Other | Attending: Internal Medicine

## 2019-07-19 DIAGNOSIS — Z23 Encounter for immunization: Secondary | ICD-10-CM | POA: Insufficient documentation

## 2019-07-19 NOTE — Progress Notes (Signed)
   Covid-19 Vaccination Clinic  Name:  [name]    MRN: [id number] DOB: [birth year]  07/19/2019  [name] was observed post Covid-19 immunization for 15 minutes without incident. He was provided with Vaccine Information Sheet and instruction to access the V-Safe system.   [name] was instructed to call 911 with any severe reactions post vaccine: [name] Difficulty breathing  . Swelling of face and throat  . A fast heartbeat  . A bad rash all over body  . Dizziness and weakness   Immunizations Administered    Name Date Dose VIS Date Route   Pfizer COVID-19 Vaccine 07/19/2019  1:34 PM 0.3 mL 04/22/2019 Intramuscular   Manufacturer: [place]   Lot: [id number]   [place]: [id number]

## 2019-07-20 NOTE — Progress Notes (Signed)
Virtual Visit via Telephone Note   This visit type was conducted due to national recommendations for restrictions regarding the COVID-19 Pandemic (e.g. social distancing) in an effort to limit this patient's exposure and mitigate transmission in our community.  Due to his co-morbid illnesses, this patient is at least at moderate risk for complications without adequate follow up.  This format is felt to be most appropriate for this patient at this time.  The patient did not have access to video technology/had technical difficulties with video requiring transitioning to audio format only (telephone).  All issues noted in this document were discussed and addressed.  No physical exam could be performed with this format.  Please refer to the patient's chart for his  consent to telehealth for [facility].   The patient was identified using 2 identifiers.  Date:  07/21/2019   ID:  [name], DOB [birth year], MRN [id number]  Patient Location: Home Provider Location: Home  PCP:  [name], MD  Cardiologist:  [name], MD  Electrophysiologist:  None   Evaluation Performed:  Follow-Up Visit  Chief Complaint:    Dyspnea  History of Present Illness:    [name] is a 69 y.o. male  who presented for followup of his known coronary disease.   He has had progressive dyspnea.  He did have a negative stress perfusion study for any evidence of reversible ischemia in 2016 although there was a fixed defect. His ejection fractions well-preserved. It was well preserved on echo with some tricuspid regurgitation.  He did have some slight abnormalities on pulmonary function testing and has seen most recently years ago by Dr. Chase Caller.  He had some interstitial lung disease thought possibly to be related to his rheumatoid arthritis.   When I saw him last he had palpitations. I ordered a monitor and he had few runs of SVT. The longest lasting was 18.4 seconds.  Since I last saw him he has  continued to have some palpitations.  However, these are infrequent.  He did get the Alive Cor but he is not having symptoms long enough to actually record anything.  He is typically describing isolated skipped beats and he is not describing sustained long runs.  He is not had any presyncope or syncope.  His biggest limit is shortness of breath with any activity.   The patient does not have symptoms concerning for COVID-19 infection (fever, chills, cough, or new shortness of breath).    Past Medical History:  Diagnosis Date  . Allergy   . Anxiety   . Carotid artery occlusion    40-60% right ICA stenosis (09/2008)  . Cataract   . Chronic back pain   . CLL (chronic lymphoblastic leukemia) dx 2010   Followed at [facility] q32mo, no current therapy   . Clotting disorder ([place])   . Coronary artery disease 2010   s/p CABG '10, sees Dr. Percival Spanish  . Depression   . Diverticulosis   . DVT, lower extremity, recurrent ([place]) 2008, 2009   LLE, chronic anticoag since 2009  . Esophagitis   . Fibromyalgia   . Gallstones   . GERD (gastroesophageal reflux disease)   . Gout   . Gynecomastia, male   . H/O hiatal hernia 2008   surgery  . Hemorrhoids   . Hepatitis A yrs ago  . HIV infection ([place]) dx 1993  . Hypertension   . Impotence of organic origin   . Myocardial infarction ([place]) 2010    x  2  . Neuromuscular disorder ([facility])    neuropathy  . Osteoarthritis, knee    s/p B TKA  . Osteoporosis   . Rheumatoid arthritis(714.0) dx 2010   MTX, follows with rheum  . Seasonal allergies   . Secondary syphilis 07/24/14 dx   s/p 2 wks doxy  . Status post dilation of esophageal narrowing   . TIA (transient ischemic attack) 1997   mild residual L mouth droop  . Tubular adenoma of colon    Past Surgical History:  Procedure Laterality Date  . ANTERIOR HIP REVISION Left 08/17/2018   Procedure: ANTERIOR LEFT HIP REVISION;  Surgeon: Mcarthur Rossetti, MD;  Location: [place];  Service: Orthopedics;   Laterality: Left;  . ANTERIOR HIP REVISION Left 11/02/2018   Procedure: LEFT HIP CONSTRAINED LINER REVISION;  Surgeon: Mcarthur Rossetti, MD;  Location: [place];  Service: Orthopedics;  Laterality: Left;  . CHOLECYSTECTOMY    . COLONOSCOPY WITH PROPOFOL N/A 12/28/2012   Procedure: COLONOSCOPY WITH PROPOFOL;  Surgeon: Jerene Bears, MD;  Location: [facility];  Service: Gastroenterology;  Laterality: N/A;  . CORONARY ARTERY BYPASS GRAFT  2010   triple bypass  . ESOPHAGOGASTRODUODENOSCOPY (EGD) WITH PROPOFOL N/A 12/28/2012   Procedure: ESOPHAGOGASTRODUODENOSCOPY (EGD) WITH PROPOFOL;  Surgeon: Jerene Bears, MD;  [facility];  Service: Gastroenterology;  Laterality: N/A;  . ESOPHAGOGASTRODUODENOSCOPY (EGD) WITH PROPOFOL N/A 03/15/2013   Procedure: ESOPHAGOGASTRODUODENOSCOPY (EGD) WITH PROPOFOL;  Surgeon: Jerene Bears, MD;  [facility];  Service: Gastroenterology;  Laterality: N/A;  . ESOPHAGOGASTRODUODENOSCOPY (EGD) WITH PROPOFOL N/A 02/07/2016   Procedure: ESOPHAGOGASTRODUODENOSCOPY (EGD) WITH PROPOFOL;  Surgeon: Jerene Bears, MD;  [facility];  Service: Gastroenterology;  Laterality: N/A;  . HARDWARE REMOVAL N/A 07/02/2012   Procedure: HARDWARE REMOVAL;  Surgeon: Elaina Hoops, MD;  Location: [place] [facility];  Service: Neurosurgery;  Laterality: N/A;  . HIATAL HERNIA REPAIR     wrap  . HIP CLOSED REDUCTION Left 08/15/2018   Procedure: CLOSED REDUCTION HIP;  Surgeon: Newt Minion, MD;  Location: [place];  Service: Orthopedics;  Laterality: Left;  . HIP CLOSED REDUCTION Left 08/28/2018   Procedure: CLOSED REDUCTION HIP FOR RECURRENT DISLOCATION AND DRESSING CHANGE;  Surgeon: Jessy Oto, MD;  Location: [place];  Service: Orthopedics;  Laterality: Left;  . HIP CLOSED REDUCTION Left 09/15/2018   Procedure: CLOSED REDUCTION HIP DISLOCATION;  Surgeon: Mcarthur Rossetti, MD;  Location: [place];  Service: Orthopedics;  Laterality: Left;  . HIP CLOSED REDUCTION Left 10/31/2018     Procedure: CLOSED REDUCTION LEFT TOTAL HIP;  Surgeon: Leandrew Koyanagi, MD;  Location: [place];  Service: Orthopedics;  Laterality: Left;  . INGUINAL HERNIA REPAIR Bilateral   . JOINT REPLACEMENT Left 1999  . KNEE ARTHROPLASTY  07/22/2011   Procedure: COMPUTER ASSISTED TOTAL KNEE ARTHROPLASTY;  Surgeon: Meredith Pel, MD;  Location: [place];  Service: Orthopedics;  Laterality: Right;  Right total knee arthroplasty  . MANDIBLE SURGERY Bilateral    tmj  . REPLACEMENT TOTAL KNEE Bilateral   . ring around testicle hernia reapir  184 and 1986   x 2  . ROTATOR CUFF REPAIR Right   . SHOULDER SURGERY Left   . SPINE SURGERY  2010   "rod and screws", "failed", lopwer spine,   . stent to heart x 1  2010  . TONSILLECTOMY    . TOTAL HIP ARTHROPLASTY Left 08/13/2018   Procedure: LEFT TOTAL HIP ARTHROPLASTY ANTERIOR APPROACH;  Surgeon: Mcarthur Rossetti,  MD;  Location: [place];  Service: Orthopedics;  Laterality: Left;  . TOTAL HIP ARTHROPLASTY Left 09/17/2018   Procedure: ANTERIOR LEFT HIP REVISION;  Surgeon: Mcarthur Rossetti, MD;  Location: [place];  Service: Orthopedics;  Laterality: Left;  . UMBILICAL HERNIA REPAIR     x 1  . varicose vein     stripping  . VIDEO BRONCHOSCOPY Bilateral 10/16/2015   Procedure: VIDEO BRONCHOSCOPY WITHOUT FLUORO;  Surgeon: Brand Males, MD;  [facility]: [facility];  Service: Cardiopulmonary;  Laterality: Bilateral;     Current Meds  Medication Sig  . acetaminophen (TYLENOL) 500 MG tablet Take 500-1,000 mg by mouth every 6 (six) hours as needed for mild pain or moderate pain.   [name] albuterol (PROVENTIL) (2.5 MG/3ML) 0.083% nebulizer solution Take 3 mLs (2.5 mg total) by nebulization every 6 (six) hours as needed for wheezing or shortness of breath.  [name] atorvastatin (LIPITOR) 10 MG tablet TAKE 1 TABLET BY MOUTH AT BEDTIME  . clindamycin (CLEOCIN) 300 MG capsule Take two by mouth one hour before dental appointment, then two by mouth six hours after appointment  .  doxycycline (VIBRA-TABS) 100 MG tablet Take 1 tablet (100 mg total) by mouth 2 (two) times daily.  [name] elvitegravir-cobicistat-emtricitabine-tenofovir (GENVOYA) 150-150-200-10 MG TABS tablet Take 1 tablet by mouth daily with breakfast.  . EPINEPHrine (EPIPEN 2-PAK) 0.3 mg/0.3 mL IJ SOAJ injection Inject 0.3 mg into the muscle as needed for anaphylaxis.  [name] escitalopram (LEXAPRO) 20 MG tablet TAKE 1 TABLET BY MOUTH EVERY DAY  . famotidine (PEPCID) 40 MG tablet TAKE 1 TABLET BY MOUTH TWICE A DAY  . folic acid (FOLVITE) 1 MG tablet Take 1 mg by mouth daily.  [name] HYDROcodone-acetaminophen (NORCO) 7.5-325 MG tablet Take 1 tablet by mouth every 6 (six) hours as needed for severe pain (pain score 7-10).  [name] leflunomide (ARAVA) 20 MG tablet Take 20 mg by mouth daily.   . metoprolol succinate (TOPROL-XL) 50 MG 24 hr tablet Take 1 tablet (50 mg total) by mouth daily.  . nitroGLYCERIN (NITROSTAT) 0.4 MG SL tablet Place 1 tablet (0.4 mg total) under the tongue every 5 (five) minutes as needed for chest pain.  [name] ondansetron (ZOFRAN) 4 MG tablet Take 4 mg by mouth every 6 (six) hours as needed for nausea.  . predniSONE (DELTASONE) 5 MG tablet Take 5 mg by mouth 2 (two) times daily.   . pregabalin (LYRICA) 200 MG capsule TAKE 1 CAPSULE BY MOUTH TWICE A DAY  . PREZISTA 800 MG tablet TAKE 1 TABLET (800 MG TOTAL) BY MOUTH DAILY.  [name] SAVAYSA 30 MG TABS tablet TAKE 1 TABLET (30 MG TOTAL) BY MOUTH DAILY.  [name] sulfamethoxazole-trimethoprim (BACTRIM DS) 800-160 MG tablet Take 1 tablet by mouth every 12 (twelve) hours.  . torsemide (DEMADEX) 20 MG tablet TAKE 1 TABLET BY MOUTH EVERY DAY  . valACYclovir (VALTREX) 500 MG tablet TAKE 1 TABLET BY MOUTH EVERY DAY     Allergies:   Golimumab, Orencia [abatacept], Other, Peanut-containing drug products, Morphine, Oxycodone-acetaminophen, Penicillins, and Promethazine hcl   Social History   Tobacco Use  . Smoking status: Never Smoker  . Smokeless tobacco: Never Used  . Tobacco  comment: occ wine  Substance Use Topics  . Alcohol use: Yes    Comment: occasional wine- 1-2 per week  . Drug use: No     Family Hx: The patient's family history includes Alcohol abuse in his daughter; Asthma in his brother; Breast cancer in his mother; CVA in his brother;  Colon polyps in his father; Crohn's disease in his paternal aunt; Diabetes in his brother, maternal grandmother, and mother; Drug abuse in his daughter; Heart attack in his brother and brother; Heart disease in his brother; Hyperlipidemia in his brother, father, and mother; Hypertension in his mother; Prostate cancer in his father. There is no history of Colon cancer, Esophageal cancer, Rectal cancer, or Stomach cancer.  ROS:   Please see the history of present illness.     All other systems reviewed and are negative.   Prior CV studies:   The following studies were reviewed today:  Monitor   Labs/Other Tests and Data Reviewed:    EKG:  No ECG reviewed.  Recent Labs: 11/16/2018: Pro B Natriuretic peptide (BNP) 163.0 11/19/2018: Magnesium 1.9 12/16/2018: ALT 9 06/06/2019: BUN 17; Creatinine, Ser 0.85; Hemoglobin 12.9; Platelets 126; Potassium 3.7; Sodium 142; TSH 1.830   Recent Lipid Panel Lab Results  Component Value Date/Time   CHOL 162 10/28/2018 11:04 AM   TRIG 152 (H) 10/28/2018 11:04 AM   HDL 35 (L) 10/28/2018 11:04 AM   CHOLHDL 4.6 10/28/2018 11:04 AM   LDLCALC 102 (H) 10/28/2018 11:04 AM    Wt Readings from Last 3 Encounters:  07/21/19 180 lb (81.6 kg)  06/14/19 188 lb 9.6 oz (85.5 kg)  06/06/19 185 lb 9.6 oz (84.2 kg)     Objective:    Vital Signs:  BP 110/61   Pulse 86   Ht 5\' 9"  (1.753 m)   Wt 180 lb (81.6 kg)   BMI 26.58 kg/m    VITAL SIGNS:  reviewed  ASSESSMENT & PLAN:     Coronary artery disease:     He could be having angina with dyspnea as the equivalent.  However, he also has some chronic lung disease and has not seen pulmonary in a while.  Therefore, my first step will be to  set him up to see his pulmonologist back and if there is no thought of a reversible pulmonary etiology I will consider perfusion imaging.  History of DVT:    He is on chronic anticoagulation therapy.   Palpitation:  These are not particularly problematic.  He is on the low-dose beta-blocker.  He is not particularly bothered by these and they are not correlating with his symptoms.  He had only 12 runs of SVT but he is dyspneic much more frequently.  No change in therapy.    COVID-19 Education: He received his vaccine   Medication Adjustments/Labs and Tests Ordered: Current medicines are reviewed at length with the patient today.  Concerns regarding medicines are outlined above.   Tests Ordered: Orders Placed This Encounter  Procedures  . Ambulatory referral to Pulmonology    Medication Changes: No orders of the defined types were placed in this encounter.   Follow Up:  In Person after the pulmonary appointment  Signed, [name], MD  07/21/2019 5:31 PM    [place]

## 2019-07-21 ENCOUNTER — Telehealth (INDEPENDENT_AMBULATORY_CARE_PROVIDER_SITE_OTHER): Payer: Medicare Other | Admitting: Cardiology

## 2019-07-21 VITALS — BP 110/61 | HR 86 | Ht 69.0 in | Wt 180.0 lb

## 2019-07-21 DIAGNOSIS — I2510 Atherosclerotic heart disease of native coronary artery without angina pectoris: Secondary | ICD-10-CM

## 2019-07-21 DIAGNOSIS — R0609 Other forms of dyspnea: Secondary | ICD-10-CM

## 2019-07-21 DIAGNOSIS — R0600 Dyspnea, unspecified: Secondary | ICD-10-CM

## 2019-07-21 NOTE — Patient Instructions (Signed)
Medication Instructions:  No changes *If you need a refill on your cardiac medications before your next appointment, please call your pharmacy*  Lab Work: None needed this visit If you have labs (blood work) drawn today and your tests are completely normal, you will receive your results only by: [name] MyChart Message (if you have MyChart) OR . A paper copy in the mail If you have any lab test that is abnormal or we need to change your treatment, we will call you to review the results.  Testing/Procedures: None needed this visit  Follow-Up: At [facility], you and your health needs are our priority.  As part of our continuing mission to provide you with exceptional heart care, we have created designated Provider Care Teams.  These Care Teams include your primary Cardiologist (physician) and Advanced Practice Providers (APPs -  Physician Assistants and Nurse Practitioners) who all work together to provide you with the care you need, when you need it.  We recommend signing up for the patient portal called "MyChart".  Sign up information is provided on this After Visit Summary.  MyChart is used to connect with patients for Virtual Visits (Telemedicine).  Patients are able to view lab/test results, encounter notes, upcoming appointments, etc.  Non-urgent messages can be sent to your provider as well.   To learn more about what you can do with MyChart, go to [email or web address].    Your next appointment:   3 month(s)  The format for your next appointment:   In Person  Provider:   [name], MD  Other Instructions Referred to Pulmonology - Dr. Chase Caller

## 2019-07-22 ENCOUNTER — Telehealth: Payer: Self-pay | Admitting: Cardiology

## 2019-07-22 NOTE — Telephone Encounter (Signed)
Left message for patient to call and schedule 3 mos follow up appointment with Dr. Percival Spanish

## 2019-07-26 ENCOUNTER — Encounter: Payer: Self-pay | Admitting: Internal Medicine

## 2019-07-26 DIAGNOSIS — L570 Actinic keratosis: Secondary | ICD-10-CM | POA: Diagnosis not present

## 2019-07-26 DIAGNOSIS — C44329 Squamous cell carcinoma of skin of other parts of face: Secondary | ICD-10-CM | POA: Diagnosis not present

## 2019-07-26 DIAGNOSIS — L0889 Other specified local infections of the skin and subcutaneous tissue: Secondary | ICD-10-CM | POA: Diagnosis not present

## 2019-07-27 ENCOUNTER — Encounter: Payer: Self-pay | Admitting: Orthopaedic Surgery

## 2019-07-28 DIAGNOSIS — L84 Corns and callosities: Secondary | ICD-10-CM | POA: Diagnosis not present

## 2019-07-28 DIAGNOSIS — M79672 Pain in left foot: Secondary | ICD-10-CM | POA: Diagnosis not present

## 2019-07-28 DIAGNOSIS — G609 Hereditary and idiopathic neuropathy, unspecified: Secondary | ICD-10-CM | POA: Diagnosis not present

## 2019-07-28 DIAGNOSIS — B351 Tinea unguium: Secondary | ICD-10-CM | POA: Diagnosis not present

## 2019-08-01 ENCOUNTER — Ambulatory Visit (INDEPENDENT_AMBULATORY_CARE_PROVIDER_SITE_OTHER): Payer: Medicare Other

## 2019-08-01 ENCOUNTER — Ambulatory Visit (INDEPENDENT_AMBULATORY_CARE_PROVIDER_SITE_OTHER): Payer: Medicare Other | Admitting: Internal Medicine

## 2019-08-01 ENCOUNTER — Encounter: Payer: Self-pay | Admitting: Internal Medicine

## 2019-08-01 ENCOUNTER — Other Ambulatory Visit: Payer: Self-pay

## 2019-08-01 VITALS — BP 150/80 | HR 84 | Temp 98.3°F | Ht 69.0 in | Wt 184.2 lb

## 2019-08-01 DIAGNOSIS — R05 Cough: Secondary | ICD-10-CM | POA: Diagnosis not present

## 2019-08-01 DIAGNOSIS — J411 Mucopurulent chronic bronchitis: Secondary | ICD-10-CM | POA: Diagnosis not present

## 2019-08-01 DIAGNOSIS — R059 Cough, unspecified: Secondary | ICD-10-CM | POA: Insufficient documentation

## 2019-08-01 DIAGNOSIS — I2510 Atherosclerotic heart disease of native coronary artery without angina pectoris: Secondary | ICD-10-CM | POA: Diagnosis not present

## 2019-08-01 MED ORDER — LONHALA MAGNAIR STARTER KIT 25 MCG/ML IN SOLN: 1.0000 | Freq: Two times a day (BID) | RESPIRATORY_TRACT | 1 refills | Status: DC

## 2019-08-01 MED ORDER — LONHALA MAGNAIR REFILL KIT 25 MCG/ML IN SOLN: 1.0000 | Freq: Two times a day (BID) | RESPIRATORY_TRACT | 5 refills | Status: DC

## 2019-08-01 NOTE — Progress Notes (Signed)
Subjective:  Patient ID: [name], male    DOB: 1950/09/22  Age: 69 y.o. MRN: [id number]  CC: COPD and Bronchitis  This visit occurred during the SARS-CoV-2 public health emergency.  Safety protocols were in place, including screening questions prior to the visit, additional usage of staff PPE, and extensive cleaning of exam room while observing appropriate contact time as indicated for disinfecting solutions.    HPI [name] presents for cough.  He complains of chronic cough that is somewhat worsening. It is productive of clear phlegm with bubbles.  He has a history of pulmonary fibrosis related to rheumatologic disease.  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  He says the cough is associated with shortness of breath but he never notices wheezing.  He denies chest pain, hemoptysis, fever, or chills.  He is compliant with the Bactrim DS.  He says he is also getting some symptom relief with an albuterol nebulizer.  Outpatient Medications Prior to Visit  Medication Sig Dispense Refill  . acetaminophen (TYLENOL) 500 MG tablet Take 500-1,000 mg by mouth every 6 (six) hours as needed for mild pain or moderate pain.     [name] albuterol (PROVENTIL) (2.5 MG/3ML) 0.083% nebulizer solution Take 3 mLs (2.5 mg total) by nebulization every 6 (six) hours as needed for wheezing or shortness of breath. 150 mL 1  . atorvastatin (LIPITOR) 10 MG tablet TAKE 1 TABLET BY MOUTH AT BEDTIME 30 tablet 9  . clindamycin (CLEOCIN) 300 MG capsule Take two by mouth one hour before dental appointment, then two by mouth six hours after appointment 12 capsule 0  . elvitegravir-cobicistat-emtricitabine-tenofovir (GENVOYA) 150-150-200-10 MG TABS tablet Take 1 tablet by mouth daily with breakfast. 30 tablet 5  . EPINEPHrine (EPIPEN 2-PAK) 0.3 mg/0.3 mL IJ SOAJ injection Inject 0.3 mg into the muscle as needed for anaphylaxis.    [name] escitalopram (LEXAPRO) 20 MG tablet  TAKE 1 TABLET BY MOUTH EVERY DAY 30 tablet 5  . famotidine (PEPCID) 40 MG tablet TAKE 1 TABLET BY MOUTH TWICE A DAY 631 tablet 0  . folic acid (FOLVITE) 1 MG tablet Take 1 mg by mouth daily.  11  . HYDROcodone-acetaminophen (NORCO) 7.5-325 MG tablet Take 1 tablet by mouth every 6 (six) hours as needed for severe pain (pain score 7-10). 40 tablet 0  . leflunomide (ARAVA) 20 MG tablet Take 20 mg by mouth daily.     . metoprolol succinate (TOPROL-XL) 50 MG 24 hr tablet Take 1 tablet (50 mg total) by mouth daily. 90 tablet 3  . nitroGLYCERIN (NITROSTAT) 0.4 MG SL tablet Place 1 tablet (0.4 mg total) under the tongue every 5 (five) minutes as needed for chest pain. 25 tablet 2  . ondansetron (ZOFRAN) 4 MG tablet Take 4 mg by mouth every 6 (six) hours as needed for nausea.    . predniSONE (DELTASONE) 5 MG tablet Take 5 mg by mouth 2 (two) times daily.   1  . pregabalin (LYRICA) 200 MG capsule TAKE 1 CAPSULE BY MOUTH TWICE A DAY 60 capsule 5  . PREZISTA 800 MG tablet TAKE 1 TABLET (800 MG TOTAL) BY MOUTH DAILY. 30 tablet 5  . SAVAYSA 30 MG TABS tablet TAKE 1 TABLET (30 MG TOTAL) BY MOUTH DAILY. 30 tablet 5  . sulfamethoxazole-trimethoprim (BACTRIM DS) 800-160 MG tablet Take 1 tablet by mouth every 12 (twelve) hours. 60 tablet 3  . torsemide (DEMADEX) 20 MG tablet TAKE 1 TABLET BY  MOUTH EVERY DAY 90 tablet 1  . valACYclovir (VALTREX) 500 MG tablet TAKE 1 TABLET BY MOUTH EVERY DAY 30 tablet 2  . doxycycline (VIBRA-TABS) 100 MG tablet Take 1 tablet (100 mg total) by mouth 2 (two) times daily. 28 tablet 0   No facility-administered medications prior to visit.    ROS Review of Systems  Constitutional: Negative for appetite change, chills, fatigue and fever.  HENT: Negative.   Eyes: Negative.   Respiratory: Positive for cough and shortness of breath. Negative for chest tightness and wheezing.   Cardiovascular: Negative for chest pain, palpitations and leg swelling.  Gastrointestinal: Negative for  abdominal pain, constipation, diarrhea, nausea and vomiting.  Endocrine: Negative.   Genitourinary: Negative.  Negative for difficulty urinating.  Musculoskeletal: Negative.  Negative for arthralgias and myalgias.  Skin: Negative.  Negative for color change, pallor and rash.  Neurological: Negative for dizziness, weakness and light-headedness.  Hematological: Negative for adenopathy. Does not bruise/bleed easily.  Psychiatric/Behavioral: Negative.     Objective:  BP (!) 150/80 (BP Location: Left Arm, Patient Position: Sitting, Cuff Size: Large)   Pulse 84   Temp 98.3 F (36.8 C) (Oral)   Ht _0  (1.753 m)   Wt 184 lb 4 oz (83.6 kg)   SpO2 95%   BMI 27.21 kg/m   BP Readings from Last 3 Encounters:  08/01/19 (!) 150/80  07/21/19 110/61  06/14/19 139/85    Wt Readings from Last 3 Encounters:  08/01/19 184 lb 4 oz (83.6 kg)  07/21/19 180 lb (81.6 kg)  06/14/19 188 lb 9.6 oz (85.5 kg)    Physical Exam Vitals reviewed.  Constitutional:      General: He is not in acute distress.    Appearance: He is ill-appearing. He is not toxic-appearing or diaphoretic.  HENT:     Nose: Nose normal.     Mouth/Throat:     Mouth: Mucous membranes are moist.  Eyes:     General: No scleral icterus.    Conjunctiva/sclera: Conjunctivae normal.  Cardiovascular:     Rate and Rhythm: Normal rate and regular rhythm.     Heart sounds: No murmur.  Pulmonary:     Effort: Accessory muscle usage present. No tachypnea or respiratory distress.     Breath sounds: No stridor or decreased air movement. Examination of the right-upper field reveals rhonchi. Examination of the left-upper field reveals rhonchi. Examination of the right-middle field reveals rhonchi. Examination of the left-middle field reveals rhonchi. Examination of the right-lower field reveals rhonchi. Examination of the left-lower field reveals rhonchi. Rhonchi present. No decreased breath sounds, wheezing or rales.     Comments: diffuse  exp rhonchi Musculoskeletal:        General: Normal range of motion.     Cervical back: Neck supple.     Right lower leg: No edema.     Left lower leg: No edema.  Lymphadenopathy:     Cervical: No cervical adenopathy.  Skin:    General: Skin is warm and dry.     Findings: No rash.  Neurological:     General: No focal deficit present.     Mental Status: He is alert.  Psychiatric:        Mood and Affect: Mood normal.        Behavior: Behavior normal.     Lab Results  Component Value Date   WBC 11.1 (H) 06/06/2019   HGB 12.9 (L) 06/06/2019   HCT 39.3 06/06/2019   PLT 126 (L) 06/06/2019  GLUCOSE 82 06/06/2019   CHOL 162 10/28/2018   TRIG 152 (H) 10/28/2018   HDL 35 (L) 10/28/2018   LDLCALC 102 (H) 10/28/2018   ALT 9 12/16/2018   AST 12 (L) 12/16/2018   NA 142 06/06/2019   K 3.7 06/06/2019   CL 101 06/06/2019   CREATININE 0.85 06/06/2019   BUN 17 06/06/2019   CO2 26 06/06/2019   TSH 1.830 06/06/2019   INR 1.61 11/19/2017   HGBA1C 5.1 11/16/2018   MICROALBUR 2.6 (H) 11/16/2018    MR BRAIN W WO CONTRAST  Result Date: 05/12/2019  [facility] [address] [place], [place] [address] [phone] NEUROIMAGING REPORT STUDY DATE: 05/11/2019 PATIENT NAME: [name] DOB: [birth year] MRN: [id number] EXAM: MRI Brain without contrast ORDERING CLINICIAN: Asencion Partridge Dohmeier MD CLINICAL HISTORY: 69 year old man with COMPARISON FILMS: 07/16/2018 TECHNIQUE: MRI of the brain without contrast was obtained utilizing 5 mm axial slices with T1, T2, T2 flair, SWI and diffusion weighted views.  T1 sagittal and T2 coronal views were obtained. CONTRAST: none IMAGING SITE: [facility], [place], [place] FINDINGS: On sagittal images, the spinal cord is imaged caudally to C3 and is normal in caliber.   The contents of the posterior fossa are of normal size and position.   The pituitary gland and optic chiasm appear normal.  There is mild generalized cortical  atrophy..   The ventricles are normal in size and without distortion.  There are no abnormal extra-axial collections of fluid.  The cerebellum and brainstem appears normal.   The deep gray matter appears normal.  Some scattered T2/FLAIR hyperintense foci in the hemispheres predominantly in the subcortical and deep white matter..  Diffusion weighted images are normal.  Susceptibility weighted images are normal.   The VIIth/VIIIth nerve complex appears normal.  Bilateral cataract extractions.  The orbits are otherwise normal.  The mastoid air cells appear normal.  Mild mucoperiosteal thickening in some of the ethmoid air cells.  The other paranasal sinuses appear normal.  Flow voids are identified within the major intracerebral arteries.     This MRI of the brain with and without contrast shows the following: 1.   There is mild generalized cortical atrophy and mild chronic microvascular ischemic change.  Both are likely age-related findings and unchanged compared to the 07/16/2018 MRI. 2.   There is a normal enhancement pattern and no acute findings. INTERPRETING PHYSICIAN: Richard A. Felecia Shelling, MD, PhD, FAAN Certified in  Neuroimaging by [place] [facility]   MR [place]  Result Date: 05/12/2019  [facility] [address], [place], [place] [address] [phone] NEUROIMAGING REPORT STUDY DATE: 05/11/2019 PATIENT NAME: [name] DOB: 09/05/1950 MRN: [id number] EXAM: MRI of the cervical spine ORDERING CLINICIAN: Asencion Partridge Dohmeier MD CLINICAL HISTORY: 69 year old man with gait disturbance COMPARISON FILMS: MRI 07/16/2018 TECHNIQUE: MRI of the cervical spine was obtained utilizing 3 mm sagittal slices from the posterior fossa down to the T3-4 level with T1, T2 and inversion recovery views. In addition 4 mm axial slices from W1-0 down to T1-2 level were included with T2 and gradient echo views. CONTRAST: None IMAGING SITE: [facility], [place],  [place], [place] FINDINGS: :  On sagittal images, the spine is imaged from above the cervicomedullary junction to T3.   The spinal cord is of normal caliber and signal.   The vertebral bodies are normally aligned.  However, there is accentuation of the cervical lordotic curvature.  There are  endplate degenerative marrow changes adjacent to the C6-C7 disc associated with reduced disc height. The discs and interspaces were further evaluated on axial views from C2 to T1 as follows: C2 - C3:  The disc and interspace appear normal. C3 - C4: There is left greater than right facet hypertrophy, mild left uncovertebral spurring and mild disc bulging.  There is no spinal stenosis or nerve root compression. C4 - C5: There is uncovertebral spurring and mild disc bulging.  There is no significant foraminal narrowing or spinal stenosis.. C5 - C6: There is disc bulging, right greater than left uncovertebral spurring and ligamenta flava hypertrophy.  This causes mild spinal stenosis and mild right foraminal narrowing but no nerve root compression.. C6 - C7: There is reduced disc height, uncovertebral spurring and ligamenta flava hypertrophy causing mild spinal stenosis and mild bilateral foraminal narrowing but no nerve root compression. C7 - T1:  The disc and interspace appear normal. T1 - T2: This level is only evaluated on the sagittal images.  There appears to be mild spinal stenosis due to disc protrusion and ligamenta flava hypertrophy.  No nerve root compression.   This MRI of the cervical spine without contrast shows the following: 1.   The spinal cord appears normal. 2.   There are multilevel degenerative changes as detailed above.  Mild spinal stenosis is noted at C5-C6, C6-C7 and T1-T2 but there does not appear to be any nerve root compression. INTERPRETING PHYSICIAN: Richard A. Felecia Shelling, MD, PhD, FAAN Certified in  Neuroimaging by [employer] [facility]   CT HIP LEFT WO CONTRAST  Result Date:  05/11/2019 CLINICAL DATA:  Left hip pain, fall about 1 month ago. Prior total hip replacement and/or revisions. EXAM: CT OF THE LEFT HIP WITHOUT CONTRAST TECHNIQUE: Multidetector CT imaging of the left hip was performed according to the standard protocol. Multiplanar CT image reconstructions were also generated. COMPARISON:  Radiographs from 03/24/2019 FINDINGS: Bones/Joint/Cartilage Compared 09/26/2018, interval left hip revision. Periprosthetic fracture along the revision collar and acetabular shell excluding obliquely and cephalad in the right iliac bone as shown on images 45-50 of series 9, with resulting medial and upward migration of the acetabular shell and protrusio. There is a somewhat irregular collection of callus along the margins the acetabular shell and region of protrusio, along the deep margin of the iliacus, and irregular osteoid also along the lateral margin of the iliac bone just above the prosthesis as shown for example on images 21 through 36 of series 7. Of the 3 posterior screws affixing the acetabular shell, the more superior and middle screws appear to have surrounding lucency, possibly from loosening or infection. Of the 2 anterior screws part of the upper screw, there is lucency around at least. Widening and subsidence of the acetabulum noted. There is no evidence of loosening or periprosthetic fracture along the stem component of the femoral portion of the hip implant. In the upper acetabulum there is little in the way of functional native anterior acetabular wall aside from callus formation. Note is made of posterolateral rod and pedicle screw fixators in the sacrum bilaterally, the right sacral screw may capture the right lateral recess. Thickening of the left iliacus muscle noted with some dystrophic calcifications. Soft tissue density anterior to the hip joint, some of which may be from scarring related to prior surgeries for example on image 37/4, but a fluid collection in  contiguity with the joint is not excluded. Ligaments Suboptimally assessed by CT. Muscles and Tendons As noted above,  there is expansion/widening of the left iloipsoas, an underlying soft tissue mass or fluid collection is not excluded. Soft tissues Suspected bursal collection posterior to the left greater trochanter measuring about 6.0 by 2.0 by 6.3 cm (volume = 40 cm^3). As noted above there is fluid or soft tissue density prominence along the anterior hip joint. Regional atherosclerosis. IMPRESSION: 1. Periprosthetic fracture in the iliac bone along the upper margin of the acetabular shell component of the prosthesis with associated medial and upward migration of the acetabular shell and protrusio. Irregular callus along the margins of the acetabular shell and in some regions replacing the adjacent native bony acetabulum. 2. Lucency along at least 3 of the fixation screws of the shell component. 3. Widening and subsidence of the acetabulum with fluid or soft tissue prominence along the anterior joint. 4. Adjacent chronic thickening of the left iliacus muscle, internal mass or fluid collection not excluded. 5. Suspected bursal fluid collection posterior to the greater trochanter, 40 cc in volume. 6. Regional atherosclerosis. Electronically Signed   By: Van Clines M.D.   On: 05/11/2019 20:42    DG Chest 2 View  Result Date: 08/01/2019 CLINICAL DATA:  Cough x2 weeks. EXAM: CHEST - 2 VIEW COMPARISON:  November 19, 2018 FINDINGS: Multiple sternal wires and vascular clips are seen. Mild, stable linear scarring and/or atelectasis is seen within the left lung base. This is unchanged in appearance when compared to the prior study. A small, stable left pleural effusion is noted. No pneumothorax is identified. The heart size and mediastinal contours are within normal limits. Bilateral pedicle screws are seen within the lower thoracic spine with 3 radiopaque surgical pins seen overlying the right humeral head.  IMPRESSION: 1. Mild, stable left basilar linear scarring and/or atelectasis. 2. Small, stable left pleural effusion. 3. Evidence of prior median sternotomy/CABG. 4. Postoperative changes within the right shoulder and lower thoracic spine. Electronically Signed   By: Virgina Norfolk M.D.   On: 08/01/2019 22:23    Assessment & Plan:   Avian was seen today for copd and bronchitis.  Diagnoses and all orders for this visit:  Cough- His chest x-ray is negative for mass or infiltrates. -     DG Chest 2 View; Future  Chronic bronchitis, mucopurulent ([place])- I think he has developed chronic bronchitis and would benefit from using a nebulized LAMA.  Will continue the nebulized albuterol as needed. -     Glycopyrrolate (LONHALA MAGNAIR REFILL KIT) 25 MCG/ML SOLN; Inhale 1 Act into the lungs in the morning and at bedtime. -     Glycopyrrolate (LONHALA MAGNAIR STARTER KIT) 25 MCG/ML SOLN; Inhale 1 Act into the lungs in the morning and at bedtime.   I have discontinued Dorn L. Schobert "Phil"'s doxycycline. I am also having him start on [place] Magnair Refill Kit and [employer]. Additionally, I am having him maintain his predniSONE, folic acid, nitroGLYCERIN, leflunomide, acetaminophen, EPINEPHrine, ondansetron, Savaysa, valACYclovir, pregabalin, atorvastatin, clindamycin, Genvoya, sulfamethoxazole-trimethoprim, HYDROcodone-acetaminophen, escitalopram, torsemide, famotidine, metoprolol succinate, albuterol, and Prezista.  Meds ordered this encounter  Medications  . Glycopyrrolate (LONHALA MAGNAIR REFILL KIT) 25 MCG/ML SOLN    Sig: Inhale 1 Act into the lungs in the morning and at bedtime.    Dispense:  60 mL    Refill:  5  . Glycopyrrolate (LONHALA MAGNAIR STARTER KIT) 25 MCG/ML SOLN    Sig: Inhale 1 Act into the lungs in the morning and at bedtime.    Dispense:  60 mL  Refill:  1     Follow-up: Return in about 6 weeks (around 09/12/2019).  [name], MD

## 2019-08-01 NOTE — Patient Instructions (Signed)

## 2019-08-02 ENCOUNTER — Encounter: Payer: Self-pay | Admitting: Internal Medicine

## 2019-08-04 ENCOUNTER — Encounter: Payer: Self-pay | Admitting: Internal Medicine

## 2019-08-04 ENCOUNTER — Encounter: Payer: Self-pay | Admitting: Physician Assistant

## 2019-08-04 ENCOUNTER — Other Ambulatory Visit: Payer: Medicare Other

## 2019-08-04 ENCOUNTER — Other Ambulatory Visit (HOSPITAL_COMMUNITY)
Admission: RE | Admit: 2019-08-04 | Discharge: 2019-08-04 | Disposition: A | Discharge status: Home / Self Care | Payer: Medicare Other | Source: Ambulatory Visit | Attending: Infectious Diseases | Admitting: Infectious Diseases

## 2019-08-04 ENCOUNTER — Other Ambulatory Visit: Payer: Self-pay | Admitting: Internal Medicine

## 2019-08-04 ENCOUNTER — Ambulatory Visit (INDEPENDENT_AMBULATORY_CARE_PROVIDER_SITE_OTHER): Payer: Medicare Other | Admitting: Physician Assistant

## 2019-08-04 ENCOUNTER — Other Ambulatory Visit: Payer: Self-pay

## 2019-08-04 ENCOUNTER — Ambulatory Visit (INDEPENDENT_AMBULATORY_CARE_PROVIDER_SITE_OTHER): Payer: Medicare Other

## 2019-08-04 DIAGNOSIS — I2510 Atherosclerotic heart disease of native coronary artery without angina pectoris: Secondary | ICD-10-CM | POA: Diagnosis not present

## 2019-08-04 DIAGNOSIS — M2550 Pain in unspecified joint: Secondary | ICD-10-CM | POA: Diagnosis not present

## 2019-08-04 DIAGNOSIS — R110 Nausea: Secondary | ICD-10-CM | POA: Diagnosis not present

## 2019-08-04 DIAGNOSIS — N138 Other obstructive and reflux uropathy: Secondary | ICD-10-CM

## 2019-08-04 DIAGNOSIS — R29818 Other symptoms and signs involving the nervous system: Secondary | ICD-10-CM | POA: Diagnosis not present

## 2019-08-04 DIAGNOSIS — N400 Enlarged prostate without lower urinary tract symptoms: Secondary | ICD-10-CM | POA: Insufficient documentation

## 2019-08-04 DIAGNOSIS — Z113 Encounter for screening for infections with a predominantly sexual mode of transmission: Secondary | ICD-10-CM | POA: Diagnosis not present

## 2019-08-04 DIAGNOSIS — E663 Overweight: Secondary | ICD-10-CM | POA: Diagnosis not present

## 2019-08-04 DIAGNOSIS — M0609 Rheumatoid arthritis without rheumatoid factor, multiple sites: Secondary | ICD-10-CM | POA: Diagnosis not present

## 2019-08-04 DIAGNOSIS — Z79899 Other long term (current) drug therapy: Secondary | ICD-10-CM | POA: Diagnosis not present

## 2019-08-04 DIAGNOSIS — Z96642 Presence of left artificial hip joint: Secondary | ICD-10-CM | POA: Diagnosis not present

## 2019-08-04 DIAGNOSIS — M25552 Pain in left hip: Secondary | ICD-10-CM

## 2019-08-04 DIAGNOSIS — B59 Pneumocystosis: Secondary | ICD-10-CM | POA: Diagnosis not present

## 2019-08-04 DIAGNOSIS — Z21 Asymptomatic human immunodeficiency virus [HIV] infection status: Secondary | ICD-10-CM | POA: Diagnosis not present

## 2019-08-04 DIAGNOSIS — Z6828 Body mass index (BMI) 28.0-28.9, adult: Secondary | ICD-10-CM | POA: Diagnosis not present

## 2019-08-04 DIAGNOSIS — C9110 Chronic lymphocytic leukemia of B-cell type not having achieved remission: Secondary | ICD-10-CM | POA: Diagnosis not present

## 2019-08-04 DIAGNOSIS — J849 Interstitial pulmonary disease, unspecified: Secondary | ICD-10-CM | POA: Diagnosis not present

## 2019-08-04 DIAGNOSIS — M150 Primary generalized (osteo)arthritis: Secondary | ICD-10-CM | POA: Diagnosis not present

## 2019-08-04 DIAGNOSIS — M1A09X0 Idiopathic chronic gout, multiple sites, without tophus (tophi): Secondary | ICD-10-CM | POA: Diagnosis not present

## 2019-08-04 DIAGNOSIS — N401 Enlarged prostate with lower urinary tract symptoms: Secondary | ICD-10-CM

## 2019-08-04 NOTE — Addendum Note (Signed)
Addended by: Monia Sabal on: 08/04/2019 09:49 AM   Modules accepted: Orders

## 2019-08-04 NOTE — Progress Notes (Signed)
Office Visit Note   Patient: [name]           Date of Birth: 03/03/1951           MRN: [id number] Visit Date: 08/04/2019              Requested by: [name], MD [address] [place],  [place] [address] PCP: [name], MD   Assessment & Plan: Visit Diagnoses:  1. History of revision of total replacement of left hip joint   2. Pain in left hip     Plan: Will repeat CT scan of the left hip to evaluate the periprosthetic fracture of the iliac bone and check for any further migration of the acetabular component and rule out loosening of hardware.  He will follow-up with Dr. Ninfa Linden after the CT scan to go over the results and discuss further treatment.  Questions were encouraged and answered.  Follow-Up Instructions: Return After CT scan.   Orders:  Orders Placed This Encounter  Procedures  . XR HIP UNILAT W OR W/O PELVIS 1V LEFT   No orders of the defined types were placed in this encounter.     Procedures: No procedures performed   Clinical Data: No additional findings.   Subjective: Chief Complaint  Patient presents with  . Left Hip - Pain    HPI Mr. Elahi comes in today due to increasing pain in his left hip for the last week.  He describes it as a sharp pain that is knifelike.  He denies any numbness tingling down the leg.  He does have radiation of pain down to the left foot pain is with standing or trying to ambulate.  He states his balance and gait are overall becoming more impaired.  Feels like the pain is deep within the left hip. Given patient had multiple surgeries due to and stable left hip replacement then finally had a Staebler acetabular liner and unfortunately had a hard mechanical fall which caused some fractures around the acetabular component.  Since that time he has been having increasing pain in the hip.  He does not feel as if the hip is dislocating. Review of Systems Denies any fevers chills shortness of breath chest pain  please see HPI otherwise negative  Objective: Vital Signs: There were no vitals taken for this visit.  Physical Exam General: Well-developed well-nourished male in no acute distress seated in the wheelchair. Psych: Alert and oriented x3 Ortho Exam Left hip good range of motion without pain.  Negative straight leg raise on the left. Specialty Comments:  No specialty comments available.  Imaging: XR HIP UNILAT W OR W/O PELVIS 1V LEFT  Result Date: 08/04/2019 Supine AP pelvis and lateral view of the left hip: No acute fracture.  Periosteal reaction around the left hip component.  No change in overall position alignment of the total hip components.  Hip appears to be well located.    PMFS History: Patient Active Problem List   Diagnosis Date Noted  . BPH with obstruction/lower urinary tract symptoms 08/04/2019  . Chronic bronchitis, mucopurulent ([place]) 08/01/2019  . Drug-induced polyneuropathy ([place]) 06/14/2019  . Educated about COVID-19 virus infection 06/05/2019  . Personal history of TIA (transient ischemic attack) 04/13/2019  . HIV 2 as the cause of diseases classified elsewhere ([place]) 04/13/2019  . HIV-1 associated autonomic neuropathy ([place]) 04/13/2019  . Ataxia 04/13/2019  . Impaired functional mobility, balance, gait, and endurance 04/13/2019  . Cerebellar ataxia in diseases classified  elsewhere ([place]) 04/13/2019  . Recurrent major depressive disorder, in partial remission ([place]) 11/23/2018  . Atherosclerosis of coronary artery bypass graft(s), unspecified, with other forms of angina pectoris ([place]) 11/23/2018  . Murmur, cardiac 11/23/2018  . Iron deficiency anemia due to dietary causes 11/17/2018  . Bilateral leg edema 11/16/2018  . Failure of right total hip arthroplasty with dislocation of hip ([place])   . Dislocation of hip prosthesis ([place])   . Avascular necrosis of bone of hip, left ([place]) 08/09/2018  . Eczema 07/01/2018  . Candida rash of groin 07/01/2018  . Depression with  anxiety 01/14/2018  . 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 Carotid artery disease ([place]) 11/13/2016  . Stenosis of carotid artery 11/13/2016  . Spinal stenosis of thoracolumbar region 07/02/2016  . ILD (interstitial lung disease) ([place]) 09/13/2015  . Immunocompromised state ([place]) 09/13/2015  . Tinea cruris 09/12/2015  . PCP (pneumocystis jiroveci pneumonia) ([place]) 06/18/2015  . Postinflammatory pulmonary fibrosis ([place]) 05/11/2015  . GERD (gastroesophageal reflux disease)   . Hereditary and idiopathic peripheral neuropathy 09/08/2013  . Erosive esophagitis 12/28/2012  . Allergic rhinitis, cause unspecified 04/28/2012  . Long term current use of anticoagulant therapy 02/03/2012  . Hypertension   . DVT, lower extremity, recurrent ([place])   . Gout   . Carotid artery occlusion   . Hyperlipidemia with target LDL less than 100   . HIV infection ([place]) 04/08/2011  . Arthritis, rheumatoid ([place]) 04/08/2011  . CLL (chronic lymphoid leukemia) in relapse ([place]) 04/08/2011  . Type 2 diabetes mellitus with diabetic polyneuropathy, without long-term current use of insulin ([place]) 04/02/2011  . Impotence of organic origin 04/02/2011  . CAD (coronary artery disease) of artery bypass graft 02/14/2009  . Herpes genitalis 06/22/2008   Past Medical History:  Diagnosis Date  . Allergy   . Anxiety   . Carotid artery occlusion    40-60% right ICA stenosis (09/2008)  . Cataract   . Chronic back pain   . CLL (chronic lymphoblastic leukemia) dx 2010   Followed at [facility] q16[facility], no current therapy   . Clotting disorder ([place])   . Coronary artery disease 2010   s/p CABG '10, sees Dr. Percival Spanish  . Depression   . Diverticulosis   . DVT, lower extremity, recurrent ([place]) 2008, 2009   LLE, chronic anticoag since 2009  . Esophagitis   . Fibromyalgia   . Gallstones   . GERD (gastroesophageal reflux disease)   . Gout   . Gynecomastia, male   . H/O hiatal hernia 2008   surgery  . Hemorrhoids   . Hepatitis A  yrs ago  . HIV infection ([place]) dx 1993  . Hypertension   . Impotence of organic origin   . Myocardial infarction ([place]) 2010    x 2  . Neuromuscular disorder ([facility])    neuropathy  . Osteoarthritis, knee    s/p B TKA  . Osteoporosis   . Rheumatoid arthritis(714.0) dx 2010   MTX, follows with rheum  . Seasonal allergies   . Secondary syphilis 07/24/14 dx   s/p 2 wks doxy  . Status post dilation of esophageal narrowing   . TIA (transient ischemic attack) 1997   mild residual L mouth droop  . Tubular adenoma of colon     Family History  Problem Relation Age of Onset  . Breast cancer Mother   . Hypertension Mother   . Hyperlipidemia Mother   . Diabetes Mother   . Prostate cancer Father   . Colon polyps Father   .  Hyperlipidemia Father   . Crohn's disease Paternal Aunt   . Diabetes Maternal Grandmother   . Diabetes Brother        x 3  . Heart attack Brother   . Heart disease Brother        x 3  . Heart attack Brother   . Hyperlipidemia Brother        x 3  . Alcohol abuse Daughter   . Drug abuse Daughter   . Asthma Brother   . CVA Brother   . Colon cancer Neg Hx   . Esophageal cancer Neg Hx   . Rectal cancer Neg Hx   . Stomach cancer Neg Hx     Past Surgical History:  Procedure Laterality Date  . ANTERIOR HIP REVISION Left 08/17/2018   Procedure: ANTERIOR LEFT HIP REVISION;  Surgeon: Mcarthur Rossetti, MD;  Location: [facility];  Service: Orthopedics;  Laterality: Left;  . ANTERIOR HIP REVISION Left 11/02/2018   Procedure: LEFT HIP CONSTRAINED LINER REVISION;  Surgeon: Mcarthur Rossetti, MD;  Location: [facility];  Service: Orthopedics;  Laterality: Left;  . CHOLECYSTECTOMY    . COLONOSCOPY WITH PROPOFOL N/A 12/28/2012   Procedure: COLONOSCOPY WITH PROPOFOL;  Surgeon: Jerene Bears, MD;  [facility];  Service: Gastroenterology;  Laterality: N/A;  . CORONARY ARTERY BYPASS GRAFT  2010   triple bypass  . ESOPHAGOGASTRODUODENOSCOPY (EGD) WITH PROPOFOL N/A  12/28/2012   Procedure: ESOPHAGOGASTRODUODENOSCOPY (EGD) WITH PROPOFOL;  Surgeon: Jerene Bears, MD;  [facility];  Service: Gastroenterology;  Laterality: N/A;  . ESOPHAGOGASTRODUODENOSCOPY (EGD) WITH PROPOFOL N/A 03/15/2013   Procedure: ESOPHAGOGASTRODUODENOSCOPY (EGD) WITH PROPOFOL;  Surgeon: Jerene Bears, MD;  [facility];  Service: Gastroenterology;  Laterality: N/A;  . ESOPHAGOGASTRODUODENOSCOPY (EGD) WITH PROPOFOL N/A 02/07/2016   Procedure: ESOPHAGOGASTRODUODENOSCOPY (EGD) WITH PROPOFOL;  Surgeon: Jerene Bears, MD;  [facility];  Service: Gastroenterology;  Laterality: N/A;  . HARDWARE REMOVAL N/A 07/02/2012   Procedure: HARDWARE REMOVAL;  Surgeon: Elaina Hoops, MD;  Location: [facility];  Service: Neurosurgery;  Laterality: N/A;  . HIATAL HERNIA REPAIR     wrap  . HIP CLOSED REDUCTION Left 08/15/2018   Procedure: CLOSED REDUCTION HIP;  Surgeon: Newt Minion, MD;  Location: [facility];  Service: Orthopedics;  Laterality: Left;  . HIP CLOSED REDUCTION Left 08/28/2018   Procedure: CLOSED REDUCTION HIP FOR RECURRENT DISLOCATION AND DRESSING CHANGE;  Surgeon: Jessy Oto, MD;  Location: [facility];  Service: Orthopedics;  Laterality: Left;  . HIP CLOSED REDUCTION Left 09/15/2018   Procedure: CLOSED REDUCTION HIP DISLOCATION;  Surgeon: Mcarthur Rossetti, MD;  Location: [facility];  Service: Orthopedics;  Laterality: Left;  . HIP CLOSED REDUCTION Left 10/31/2018   Procedure: CLOSED REDUCTION LEFT TOTAL HIP;  Surgeon: Leandrew Koyanagi, MD;  Location: [facility];  Service: Orthopedics;  Laterality: Left;  . INGUINAL HERNIA REPAIR Bilateral   . JOINT REPLACEMENT Left 1999  . KNEE ARTHROPLASTY  07/22/2011   Procedure: COMPUTER ASSISTED TOTAL KNEE ARTHROPLASTY;  Surgeon: Meredith Pel, MD;  Location: [facility];  Service: Orthopedics;  Laterality: Right;  Right total knee arthroplasty  . MANDIBLE SURGERY Bilateral    tmj  . REPLACEMENT TOTAL KNEE Bilateral   . ring around testicle  hernia reapir  184 and 1986   x 2  . ROTATOR CUFF REPAIR Right   . SHOULDER SURGERY Left   . [place]  2010   "rod and screws", "failed", lopwer spine,   .  stent to heart x 1  2010  . TONSILLECTOMY    . TOTAL HIP ARTHROPLASTY Left 08/13/2018   Procedure: LEFT TOTAL HIP ARTHROPLASTY ANTERIOR APPROACH;  Surgeon: Mcarthur Rossetti, MD;  Location: [facility];  Service: Orthopedics;  Laterality: Left;  . TOTAL HIP ARTHROPLASTY Left 09/17/2018   Procedure: ANTERIOR LEFT HIP REVISION;  Surgeon: Mcarthur Rossetti, MD;  Location: [facility];  Service: Orthopedics;  Laterality: Left;  . UMBILICAL HERNIA REPAIR     x 1  . varicose vein     stripping  . VIDEO BRONCHOSCOPY Bilateral 10/16/2015   Procedure: VIDEO BRONCHOSCOPY WITHOUT FLUORO;  Surgeon: Brand Males, MD;  [facility];  Service: Cardiopulmonary;  Laterality: Bilateral;   Social History   Occupational History  . Occupation: retired  Tobacco Use  . Smoking status: Never Smoker  . Smokeless tobacco: Never Used  . Tobacco comment: occ wine  Substance and Sexual Activity  . Alcohol use: Yes    Comment: occasional wine- 1-2 per week  . Drug use: No  . Sexual activity: Not Currently    Comment: declined condoms

## 2019-08-05 LAB — URINE CYTOLOGY ANCILLARY ONLY
Chlamydia: NEGATIVE
Comment: NEGATIVE
Comment: NORMAL
Neisseria Gonorrhea: NEGATIVE

## 2019-08-05 LAB — T-HELPER CELL (CD4) - (RCID CLINIC ONLY)
CD4 % Helper T Cell: 12 % — ABNORMAL LOW (ref 33–65)
CD4 T Cell Abs: 1255 /uL (ref 400–1790)

## 2019-08-06 ENCOUNTER — Other Ambulatory Visit: Payer: Self-pay | Admitting: Infectious Diseases

## 2019-08-06 DIAGNOSIS — A6000 Herpesviral infection of urogenital system, unspecified: Secondary | ICD-10-CM

## 2019-08-08 ENCOUNTER — Other Ambulatory Visit: Payer: Self-pay | Admitting: Internal Medicine

## 2019-08-08 DIAGNOSIS — M792 Neuralgia and neuritis, unspecified: Secondary | ICD-10-CM

## 2019-08-08 LAB — LIPID PANEL
Cholesterol: 214 mg/dL — ABNORMAL HIGH (ref <200)
HDL: 40 mg/dL (ref >=40)
LDL Cholesterol (Calc): 142 mg/dL (calc) — ABNORMAL HIGH
Non-HDL Cholesterol (Calc): 174 mg/dL (calc) — ABNORMAL HIGH (ref <130)
Total CHOL/HDL Ratio: 5.4 (calc) — ABNORMAL HIGH (ref <5.0)
Triglycerides: 180 mg/dL — ABNORMAL HIGH (ref <150)

## 2019-08-08 LAB — COMPREHENSIVE METABOLIC PANEL
AG Ratio: 2.5 (calc) (ref 1.0–2.5)
ALT: 11 U/L (ref 9–46)
AST: 14 U/L (ref 10–35)
Albumin: 4 g/dL (ref 3.6–5.1)
Alkaline phosphatase (APISO): 86 U/L (ref 35–144)
BUN: 17 mg/dL (ref 7–25)
CO2: 29 mmol/L (ref 20–32)
Calcium: 9.1 mg/dL (ref 8.6–10.3)
Chloride: 104 mmol/L (ref 98–110)
Creat: 1 mg/dL (ref 0.70–1.18)
Globulin: 1.6 g/dL (calc) — ABNORMAL LOW (ref 1.9–3.7)
Glucose, Bld: 87 mg/dL (ref 65–99)
Potassium: 4 mmol/L (ref 3.5–5.3)
Sodium: 142 mmol/L (ref 135–146)
Total Bilirubin: 0.7 mg/dL (ref 0.2–1.2)
Total Protein: 5.6 g/dL — ABNORMAL LOW (ref 6.1–8.1)

## 2019-08-08 LAB — CBC
HCT: 38.5 % (ref 38.5–50.0)
Hemoglobin: 12.9 g/dL — ABNORMAL LOW (ref 13.2–17.1)
MCH: 34.4 pg — ABNORMAL HIGH (ref 27.0–33.0)
MCHC: 33.5 g/dL (ref 32.0–36.0)
MCV: 102.7 fL — ABNORMAL HIGH (ref 80.0–100.0)
MPV: 9.8 fL (ref 7.5–12.5)
Platelets: 138 10*3/uL — ABNORMAL LOW (ref 140–400)
RBC: 3.75 10*6/uL — ABNORMAL LOW (ref 4.20–5.80)
RDW: 12.5 % (ref 11.0–15.0)
WBC: 14.5 10*3/uL — ABNORMAL HIGH (ref 3.8–10.8)

## 2019-08-08 LAB — HIV-1 RNA QUANT-NO REFLEX-BLD
HIV 1 RNA Quant: <20 copies/mL
HIV-1 RNA Quant, Log: <1.3 Log copies/mL

## 2019-08-08 LAB — RPR TITER: RPR Titer: 1:1 {titer} — ABNORMAL HIGH

## 2019-08-08 LAB — RPR: RPR Ser Ql: REACTIVE — AB

## 2019-08-08 LAB — FLUORESCENT TREPONEMAL AB(FTA)-IGG-BLD: Fluorescent Treponemal ABS: REACTIVE — AB

## 2019-08-09 ENCOUNTER — Other Ambulatory Visit: Payer: Self-pay | Admitting: Radiology

## 2019-08-09 DIAGNOSIS — Z96642 Presence of left artificial hip joint: Secondary | ICD-10-CM

## 2019-08-12 ENCOUNTER — Other Ambulatory Visit: Payer: Self-pay

## 2019-08-12 ENCOUNTER — Ambulatory Visit
Admission: RE | Admit: 2019-08-12 | Discharge: 2019-08-12 | Disposition: A | Discharge status: Home / Self Care | Payer: Medicare Other | Source: Ambulatory Visit | Attending: Physician Assistant | Admitting: Physician Assistant

## 2019-08-12 ENCOUNTER — Other Ambulatory Visit: Payer: Medicare Other

## 2019-08-12 DIAGNOSIS — Z96642 Presence of left artificial hip joint: Secondary | ICD-10-CM

## 2019-08-12 DIAGNOSIS — M25552 Pain in left hip: Secondary | ICD-10-CM | POA: Diagnosis not present

## 2019-08-13 DIAGNOSIS — I6523 Occlusion and stenosis of bilateral carotid arteries: Secondary | ICD-10-CM | POA: Diagnosis not present

## 2019-08-13 DIAGNOSIS — R42 Dizziness and giddiness: Secondary | ICD-10-CM | POA: Diagnosis not present

## 2019-08-13 DIAGNOSIS — R1110 Vomiting, unspecified: Secondary | ICD-10-CM | POA: Diagnosis not present

## 2019-08-13 DIAGNOSIS — R112 Nausea with vomiting, unspecified: Secondary | ICD-10-CM | POA: Diagnosis not present

## 2019-08-14 ENCOUNTER — Encounter: Payer: Self-pay | Admitting: Internal Medicine

## 2019-08-14 DIAGNOSIS — H6690 Otitis media, unspecified, unspecified ear: Secondary | ICD-10-CM

## 2019-08-15 ENCOUNTER — Other Ambulatory Visit: Payer: Self-pay | Admitting: Internal Medicine

## 2019-08-16 ENCOUNTER — Encounter: Payer: Self-pay | Admitting: Infectious Diseases

## 2019-08-16 ENCOUNTER — Ambulatory Visit (INDEPENDENT_AMBULATORY_CARE_PROVIDER_SITE_OTHER): Payer: Medicare Other | Admitting: Infectious Diseases

## 2019-08-16 ENCOUNTER — Inpatient Hospital Stay: Payer: Medicare Other | Attending: Oncology

## 2019-08-16 ENCOUNTER — Other Ambulatory Visit: Payer: Self-pay

## 2019-08-16 ENCOUNTER — Inpatient Hospital Stay (HOSPITAL_BASED_OUTPATIENT_CLINIC_OR_DEPARTMENT_OTHER): Payer: Medicare Other | Admitting: Oncology

## 2019-08-16 VITALS — BP 100/60 | HR 76 | Temp 98.1°F | Ht 69.0 in | Wt 184.0 lb

## 2019-08-16 VITALS — BP 136/80 | HR 73 | Temp 98.3°F | Resp 20 | Ht 69.0 in | Wt 183.0 lb

## 2019-08-16 DIAGNOSIS — B20 Human immunodeficiency virus [HIV] disease: Secondary | ICD-10-CM | POA: Diagnosis not present

## 2019-08-16 DIAGNOSIS — I2510 Atherosclerotic heart disease of native coronary artery without angina pectoris: Secondary | ICD-10-CM | POA: Diagnosis not present

## 2019-08-16 DIAGNOSIS — M87052 Idiopathic aseptic necrosis of left femur: Secondary | ICD-10-CM | POA: Diagnosis not present

## 2019-08-16 DIAGNOSIS — Z21 Asymptomatic human immunodeficiency virus [HIV] infection status: Secondary | ICD-10-CM | POA: Diagnosis not present

## 2019-08-16 DIAGNOSIS — C9112 Chronic lymphocytic leukemia of B-cell type in relapse: Secondary | ICD-10-CM

## 2019-08-16 DIAGNOSIS — R0609 Other forms of dyspnea: Secondary | ICD-10-CM | POA: Diagnosis not present

## 2019-08-16 DIAGNOSIS — C9110 Chronic lymphocytic leukemia of B-cell type not having achieved remission: Secondary | ICD-10-CM

## 2019-08-16 DIAGNOSIS — C9192 Lymphoid leukemia, unspecified, in relapse: Secondary | ICD-10-CM | POA: Diagnosis not present

## 2019-08-16 LAB — CBC WITH DIFFERENTIAL (CANCER CENTER ONLY)
Abs Immature Granulocytes: 0 10*3/uL (ref 0.00–0.07)
Basophils Absolute: 0.1 10*3/uL (ref 0.0–0.1)
Basophils Relative: 1 %
Eosinophils Absolute: 0 10*3/uL (ref 0.0–0.5)
Eosinophils Relative: 0 %
HCT: 39.6 % (ref 39.0–52.0)
Hemoglobin: 12.3 g/dL — ABNORMAL LOW (ref 13.0–17.0)
Lymphocytes Relative: 74 %
Lymphs Abs: 7.8 10*3/uL — ABNORMAL HIGH (ref 0.7–4.0)
MCH: 34.9 pg — ABNORMAL HIGH (ref 26.0–34.0)
MCHC: 31.1 g/dL (ref 30.0–36.0)
MCV: 112.5 fL — ABNORMAL HIGH (ref 80.0–100.0)
Monocytes Absolute: 0.2 10*3/uL (ref 0.1–1.0)
Monocytes Relative: 2 %
Neutro Abs: 2.4 10*3/uL (ref 1.7–7.7)
Neutrophils Relative %: 23 %
Platelet Count: 123 10*3/uL — ABNORMAL LOW (ref 150–400)
RBC: 3.52 MIL/uL — ABNORMAL LOW (ref 4.22–5.81)
RDW: 14.4 % (ref 11.5–15.5)
WBC Count: 10.5 10*3/uL (ref 4.0–10.5)
nRBC: 0 % (ref 0.0–0.2)

## 2019-08-16 LAB — CMP (CANCER CENTER ONLY)
ALT: 12 U/L (ref 0–44)
AST: 16 U/L (ref 15–41)
Albumin: 3.5 g/dL (ref 3.5–5.0)
Alkaline Phosphatase: 96 U/L (ref 38–126)
Anion gap: 10 (ref 5–15)
BUN: 10 mg/dL (ref 8–23)
CO2: 29 mmol/L (ref 22–32)
Calcium: 8.6 mg/dL — ABNORMAL LOW (ref 8.9–10.3)
Chloride: 102 mmol/L (ref 98–111)
Creatinine: 0.9 mg/dL (ref 0.61–1.24)
GFR, Est AFR Am: >60 mL/min (ref >60)
GFR, Estimated: >60 mL/min (ref >60)
Glucose, Bld: 90 mg/dL (ref 70–99)
Potassium: 3.9 mmol/L (ref 3.5–5.1)
Sodium: 141 mmol/L (ref 135–145)
Total Bilirubin: 0.7 mg/dL (ref 0.3–1.2)
Total Protein: 6.1 g/dL — ABNORMAL LOW (ref 6.5–8.1)

## 2019-08-16 MED ORDER — DOVATO 50-300 MG PO TABS: 1.0000 | ORAL_TABLET | Freq: Every day | ORAL | 3 refills | Status: DC

## 2019-08-16 NOTE — Assessment & Plan Note (Signed)
Had h/o f/u today.  Doing well per pt.[name]

## 2019-08-16 NOTE — Progress Notes (Signed)
   Subjective:    Patient ID: [name], male    DOB: 11-21-1950, 69 y.o.   MRN: [id number]  HPI 69yo M with hx of HIV+, CLL(dx 2007), pulmonary fibrosis, and RA for which he takes MTX. He also has a hx of esophageal strictures dilated by EGD every 1.5 yrs.  2-202-18 he underwent lumbar laminectomy.No problems. "Dr Saintclair Halsted did an outstanding job. I praise the ground he walks on". Sees Onc yearly- told that he is doing well,(Stage 0)continue surveillance. He was seen by Rheum-  leflunamide. He is currently on genvoya/darunavir.He has had no problems with his ART. He is curious today about going on new ART.   He had dislocation of his L THR (done 09-17-2018). He had revision of this 11-02-2018. States he has had 4 hip surgeries in 6 weeks.  07-2019 CT 1. Nearly healed periprosthetic fracture involving the right iliac bone. 2. Unchanged loosening of the acetabular component with medial and superior migration. 3. Chronic thickening and dystrophic calcification within the left iliacus muscle appears decreased when compared to the prior study. Subtle lobulated 2.3 x 2.7 x 2.3 cm area within the muscle with faint rim calcification may reflect a chronic hematoma or early myositis ossificans. 4. Mild left greater trochanteric bursitis, improved when compared to prior study.  Today has concerns that his bones in his feet "feel like water". Has had worsening neuropathy. Is on lyrica.    Review of Systems  Constitutional: Negative for appetite change and unexpected weight change.  Respiratory: Negative for cough and shortness of breath.   Gastrointestinal: Negative for constipation and diarrhea.  Genitourinary: Positive for decreased urine volume and difficulty urinating.  Neurological: Positive for numbness.  has appt with uro pending.  Please see HPI. All other systems reviewed and negative.      Objective:   Physical Exam Constitutional:      Appearance: Normal appearance. He  is normal weight.  HENT:     Mouth/Throat:     Mouth: Mucous membranes are moist.     Pharynx: No oropharyngeal exudate.  Eyes:     Extraocular Movements: Extraocular movements intact.     Pupils: Pupils are equal, round, and reactive to light.  Cardiovascular:     Rate and Rhythm: Normal rate and regular rhythm.  Pulmonary:     Effort: Pulmonary effort is normal.     Breath sounds: Normal breath sounds.  Abdominal:     General: Bowel sounds are normal. There is no distension.     Palpations: Abdomen is soft.     Tenderness: There is no abdominal tenderness.  Musculoskeletal:     Cervical back: Normal range of motion and neck supple.     Right lower leg: No edema.     Left lower leg: No edema.  Neurological:     Mental Status: He is alert.  Psychiatric:        Mood and Affect: Mood normal.           Assessment & Plan:

## 2019-08-16 NOTE — Assessment & Plan Note (Signed)
Will see if getting off COBI and TDF helps.

## 2019-08-16 NOTE — Assessment & Plan Note (Signed)
Will ask pharm if they see his geno (I am unable to find) Will change him to dolutegravir-epivir. Dovato.  Let him know that it is unlikely to reverse his neuropathy but could arrest further worsening.  Has had COVID vax.  Had flu last October.  Will see him back in 2 months. Labs at that time.

## 2019-08-16 NOTE — Progress Notes (Signed)
Hematology and Oncology Follow Up Visit  [name] [id number] [birth year] 69 y.o. 08/16/2019 10:00 AM [name], MDJones, Christopher Right, MD   Principle Diagnosis: 69 year old man with CLL presented with lymphocytosis in 2007.  He was found to have stage 0 at that time.      Current therapy: Active surveillance.  Interim History:  [name] returns today for a repeat evaluation.  Since the last visit, he reports no major changes in his health.  He was diagnosed with vertigo and planned ear infection which has resolved at this time.  His mobility is limited because of degenerative arthritis and does ambulate with the help of a cane and a walker mostly.  He denies any falls or syncope.  Denies any fevers, chills or sweats.  Denies any weight loss or appetite changes.  Denies any painful adenopathy.      Medications: Reviewed without changes. Current Outpatient Medications  Medication Sig Dispense Refill  . acetaminophen (TYLENOL) 500 MG tablet Take 500-1,000 mg by mouth every 6 (six) hours as needed for mild pain or moderate pain.     [name] albuterol (PROVENTIL) (2.5 MG/3ML) 0.083% nebulizer solution Take 3 mLs (2.5 mg total) by nebulization every 6 (six) hours as needed for wheezing or shortness of breath. 150 mL 1  . atorvastatin (LIPITOR) 10 MG tablet TAKE 1 TABLET BY MOUTH AT BEDTIME 30 tablet 9  . clindamycin (CLEOCIN) 300 MG capsule Take two by mouth one hour before dental appointment, then two by mouth six hours after appointment 12 capsule 0  . elvitegravir-cobicistat-emtricitabine-tenofovir (GENVOYA) 150-150-200-10 MG TABS tablet Take 1 tablet by mouth daily with breakfast. 30 tablet 5  . EPINEPHrine (EPIPEN 2-PAK) 0.3 mg/0.3 mL IJ SOAJ injection Inject 0.3 mg into the muscle as needed for anaphylaxis.    [name] escitalopram (LEXAPRO) 20 MG tablet TAKE 1 TABLET BY MOUTH EVERY DAY 30 tablet 5  . famotidine (PEPCID) 40 MG tablet TAKE 1 TABLET BY MOUTH TWICE A DAY 425 tablet 0  . folic acid  (FOLVITE) 1 MG tablet Take 1 mg by mouth daily.  11  . Glycopyrrolate (LONHALA MAGNAIR REFILL KIT) 25 MCG/ML SOLN Inhale 1 Act into the lungs in the morning and at bedtime. 60 mL 5  . Glycopyrrolate (LONHALA MAGNAIR STARTER KIT) 25 MCG/ML SOLN Inhale 1 Act into the lungs in the morning and at bedtime. 60 mL 1  . HYDROcodone-acetaminophen (NORCO) 7.5-325 MG tablet Take 1 tablet by mouth every 6 (six) hours as needed for severe pain (pain score 7-10). 40 tablet 0  . leflunomide (ARAVA) 20 MG tablet Take 20 mg by mouth daily.     . metoprolol succinate (TOPROL-XL) 50 MG 24 hr tablet Take 1 tablet (50 mg total) by mouth daily. 90 tablet 3  . nitroGLYCERIN (NITROSTAT) 0.4 MG SL tablet Place 1 tablet (0.4 mg total) under the tongue every 5 (five) minutes as needed for chest pain. 25 tablet 2  . ondansetron (ZOFRAN) 4 MG tablet Take 4 mg by mouth every 6 (six) hours as needed for nausea.    . predniSONE (DELTASONE) 5 MG tablet Take 5 mg by mouth 2 (two) times daily.   1  . pregabalin (LYRICA) 200 MG capsule TAKE 1 CAPSULE BY MOUTH TWICE A DAY 60 capsule 3  . PREZISTA 800 MG tablet TAKE 1 TABLET (800 MG TOTAL) BY MOUTH DAILY. 30 tablet 5  . SAVAYSA 30 MG TABS tablet TAKE 1 TABLET (30 MG TOTAL) BY MOUTH DAILY. 30 tablet  5  . sulfamethoxazole-trimethoprim (BACTRIM DS) 800-160 MG tablet Take 1 tablet by mouth every 12 (twelve) hours. 60 tablet 3  . torsemide (DEMADEX) 20 MG tablet TAKE 1 TABLET BY MOUTH EVERY DAY 90 tablet 1  . valACYclovir (VALTREX) 500 MG tablet TAKE 1 TABLET BY MOUTH EVERY DAY 30 tablet 2   No current facility-administered medications for this visit.     Allergies:  Allergies  Allergen Reactions  . Golimumab Anaphylaxis    Simponi ARIA  . Orencia [Abatacept] Anaphylaxis  . Other Anaphylaxis and Hives    Pecans  . Peanut-Containing Drug Products Anaphylaxis, Hives and Swelling    Swelling of throat  . Morphine Other (See Comments)    Severe headache- Can tolerate Dilaudid,  however  . Oxycodone-Acetaminophen Other (See Comments)    Headache 6.22.2020 patient is currently taking and tolerating  . Penicillins Rash and Other (See Comments)    FLUSHED & RED, also Has patient had a PCN reaction causing immediate rash, facial/tongue/throat swelling, SOB or lightheadedness with hypotension: Yes Has patient had a PCN reaction causing severe rash involving mucus membranes or skin necrosis: No Has patient had a PCN reaction that required hospitalization: No Has patient had a PCN reaction occurring within the last 10 years: No If all of the above answers are "NO", then may proceed with Cephalosporin use  . Promethazine Hcl Other (See Comments)    Makes him feel "drunk" at higher strengths       Physical Exam:    Blood pressure 136/80, pulse 73, temperature 98.3 F (36.8 C), temperature source Temporal, resp. rate 20, height _0  (1.753 m), weight 183 lb (83 kg), SpO2 97 %.    ECOG: 1    General appearance: Comfortable appearing without any discomfort Head: Normocephalic without any trauma Oropharynx: Mucous membranes are moist and pink without any thrush or ulcers. Eyes: Pupils are equal and round reactive to light. Lymph nodes: No cervical, supraclavicular, inguinal or axillary lymphadenopathy.   Heart:regular rate and rhythm.  S1 and S2 without leg edema. Lung: Clear without any rhonchi or wheezes.  No dullness to percussion. Abdomin: Soft, nontender, nondistended with good bowel sounds.  No hepatosplenomegaly. Musculoskeletal: No joint deformity or effusion.  Full range of motion noted. Neurological: No deficits noted on motor, sensory and deep tendon reflex exam. Skin: No petechial rash or dryness.  Appeared moist.      Lab Results: Lab Results  Component Value Date   WBC 14.5 (H) 08/04/2019   HGB 12.9 (L) 08/04/2019   HCT 38.5 08/04/2019   MCV 102.7 (H) 08/04/2019   PLT 138 (L) 08/04/2019     Chemistry      Component Value Date/Time    NA 142 08/04/2019 0950   NA 142 06/06/2019 1156   NA 140 08/13/2016 0919   K 4.0 08/04/2019 0950   K 3.7 08/13/2016 0919   CL 104 08/04/2019 0950   CO2 29 08/04/2019 0950   CO2 27 08/13/2016 0919   BUN 17 08/04/2019 0950   BUN 17 06/06/2019 1156   BUN 18.7 08/13/2016 0919   CREATININE 1.00 08/04/2019 0950   CREATININE 1.1 08/13/2016 0919   GLU 92 07/09/2016 0000      Component Value Date/Time   CALCIUM 9.1 08/04/2019 0950   CALCIUM 8.8 08/13/2016 0919   ALKPHOS 133 (H) 12/16/2018 0902   ALKPHOS 114 08/13/2016 0919   AST 14 08/04/2019 0950   AST 12 (L) 12/16/2018 0902   AST 21 08/13/2016 0919  ALT 11 08/04/2019 0950   ALT 9 12/16/2018 0902   ALT 18 08/13/2016 0919   BILITOT 0.7 08/04/2019 0950   BILITOT 0.3 12/16/2018 0902   BILITOT 0.59 08/13/2016 0919        Impression and Plan:  69 year old man with  1.  CLL diagnosed in 2007.  He was found to have stage is 0 with CD38 positive, ZAP 70 positive.  He remained on active surveillance at this time without any indication for treatment currently.  The natural course of this disease and treatment indications were discussed.  These include rapid rise in his white cell count, painful adenopathy, autoimmune manifestations, opportunistic infections as well as bone marrow failure.  CBC from August 04, 2019 showed a white cell count of 14.5 without any other hematological manifestations.  CBC from today showed a normal white cell count with a hemoglobin of 12.3 and overall stable count of 123.  Treatment options for this condition were reviewed which include oral targeted therapy, systemic chemotherapy among others.  At this time he is asymptomatic and recommended continued active surveillance at this time.  He is agreeable at this time.   2.  Dyspnea on exertion: Unchanged at this time.   3.  HIV: No recent issues continue to follow with Dr. [name].  4. Follow-up: 8 months to follow his progress.  30  minutes were dedicated  to this visit. The time was spent on reviewing laboratory data, discussing treatment options, s and answering questions regarding future plan.    Zola Button, MD 4/6/202110:00 AM

## 2019-08-17 ENCOUNTER — Ambulatory Visit (INDEPENDENT_AMBULATORY_CARE_PROVIDER_SITE_OTHER): Payer: Medicare Other | Admitting: Neurology

## 2019-08-17 ENCOUNTER — Encounter: Payer: Self-pay | Admitting: Neurology

## 2019-08-17 ENCOUNTER — Telehealth: Payer: Self-pay | Admitting: Oncology

## 2019-08-17 ENCOUNTER — Telehealth: Payer: Self-pay | Admitting: Pharmacist

## 2019-08-17 ENCOUNTER — Ambulatory Visit: Payer: Medicare Other | Admitting: Oncology

## 2019-08-17 ENCOUNTER — Other Ambulatory Visit: Payer: Medicare Other

## 2019-08-17 VITALS — BP 129/77 | HR 69 | Temp 97.2°F | Ht 69.0 in | Wt 180.0 lb

## 2019-08-17 DIAGNOSIS — B20 Human immunodeficiency virus [HIV] disease: Secondary | ICD-10-CM | POA: Diagnosis not present

## 2019-08-17 DIAGNOSIS — I2510 Atherosclerotic heart disease of native coronary artery without angina pectoris: Secondary | ICD-10-CM | POA: Diagnosis not present

## 2019-08-17 DIAGNOSIS — G63 Polyneuropathy in diseases classified elsewhere: Secondary | ICD-10-CM | POA: Diagnosis not present

## 2019-08-17 DIAGNOSIS — G620 Drug-induced polyneuropathy: Secondary | ICD-10-CM | POA: Diagnosis not present

## 2019-08-17 DIAGNOSIS — T84020S Dislocation of internal right hip prosthesis, sequela: Secondary | ICD-10-CM

## 2019-08-17 MED ORDER — L-METHYLFOLATE-B6-B12 3-35-2 MG PO TABS: 1.0000 | ORAL_TABLET | Freq: Two times a day (BID) | ORAL | 5 refills | Status: DC

## 2019-08-17 NOTE — Telephone Encounter (Signed)
No genotypes have been done lately for patient but intake note from Dr. Linus Salmons in 2012 states that he had one done in 2005 that showed:  Cumulative HIV Genotype Data  RT Mutations  K65R, L74V, M184V, L100I, V108I  PI Mutations   Integrase Mutations    Interpretation of Genotype Data per [employer] HIV Drug Resistance Database:  Nucleoside RTIs  Abacavir - high level resistance Zidovudine - susceptible Emtricitabine - high level resistance Lamivudine - high level resistance Tenofovir - intermediate resistance   Non-Nucleoside RTIs  Doravirine - intermediate resistance Efavirenz - high level resistance Etravirine - intermediate resistance Nevirapine - high level resistance Rilpivirine - high level resistance   No protease or integrase resistance found.  Ahmari Duerson L. Ric Rosenberg, PharmD, BCIDP, AAHIVP, CPP Clinical Pharmacist Practitioner Infectious Diseases [facility] 08/17/2019, 11:57 AM

## 2019-08-17 NOTE — Telephone Encounter (Signed)
Scheduled appt per 4/6 los.  Printed and mailed appt calendar 

## 2019-08-17 NOTE — Progress Notes (Signed)
Provider:  Larey Seat, M D  Referring Provider: [name], MD Primary Care Physician:  [name], MD  Chief Complaint  Patient presents with  . Follow-up    pt alone, rm 10. pt is following up and had some questions.     HPI:  [name] is a 69 y.o. male seen in a RV - 08-17-2019. Patient with HIV related- and medication related polyneuropathy, pain rises as an electric shock sensation from the feet up. He has lost muscle mass, The MRIs of cervical , thoracic and brain show no significant abnormality. He is profoundly numb in both feet. His ID physician announced a change in HIV medication with the hope s to stall the neuropathy. He has fallen and is using an Transport planner. He has lost muscle mass progressivly since 2019. Balance is off since. Eye-movements are off.  He has been diagnosed with HIV in 1993, 28 years on retrovirals. We will make an attempt to improve with methylated Vit B12.Metanx.     04-13-2019 upon a referral from Dr. Saintclair Halsted for [place] . after having seen neurologist Dr. Tomi Likens already since 2014. [name] is a 69 year old right-handed Caucasian man with rheumatoid arthritis, HIV, peripheral neuropathy and history of TIA, recurrent DVT and lumbar spine surgery who presents for balance problems. History supplemented by referring provider note. His PCP is not aware of his referral. Dr Ronnald Ramp has treated him for thiamine deficiency ( patient is not a drinker he insisted) , he had 4 hip procedures, and didn't recover after wards.  He has lost muscle mass progressivly since 2019. Balance is off since. Eye-movements are off. He has been diagnosed with HIV in 1993, 27 years on retrovirals. This medication is known to cause neuropathy and there is primary HIV neuropathy as well. Dr [name] follows him.  [name] reports today that 3 month ago his legs just quit working, having to this day a horrible time walking.  His neurosurgeon by  whom he is followed for scoliosis ordered a CT of his spine and could not find an explanation for the acute onset of walking difficulties.  He sent him also for EMG and nerve conduction studies.  He has a s/p thoracolumbar fusion CT scan of the thoracolumbar spine did not show what appears to be any solid fusions nor signs of complication such as loosening of his hardware but significant kyphotic deformity results.  EMG was performed and quoted here as showing profound peripheral polyneuropathy which would be a diagnosis obtained by nerve conduction studies, not EMG.     I have no access to the original reports of the original studies. The patient runs a trailer yard, with steady residents. He is well cared for and has had health care all is life.      Review of Systems: Out of a complete 14 system review, the patient complains of only the following symptoms, and all other reviewed systems are negative.  HIV neuropathy gait disorder, last hip surgery was July 6th, GAIT INSTABILITY started already 18 month ago, progressively over 12 month now.  FALLS !!!  Social History   Socioeconomic History  . Marital status: Widowed    Spouse name: Not on file  . Number of children: 3  . Years of education: Not on file  . Highest education level: Bachelor's degree (e.g., BA, AB, BS)  Occupational History  . Occupation: retired  Tobacco Use  . Smoking status:  Never Smoker  . Smokeless tobacco: Never Used  . Tobacco comment: occ wine  Substance and Sexual Activity  . Alcohol use: Yes    Comment: occasional wine- 1-2 per week  . Drug use: No  . Sexual activity: Not Currently    Comment: declined condoms  Other Topics Concern  . Not on file  Social History Narrative   Patient is right-handed. He is a widower. He lives alone in a single level home. He is active around his home. He drinks 2-3 cups of coffee a day.   Social Determinants of Health   Financial Resource Strain:   . Difficulty of  Paying Living Expenses:   Food Insecurity:   . Worried About Charity fundraiser in the Last Year:   . Arboriculturist in the Last Year:   Transportation Needs:   . Film/video editor (Medical):   [name] Lack of Transportation (Non-Medical):   Physical Activity:   . Days of Exercise per Week:   . Minutes of Exercise per Session:   Stress:   . Feeling of Stress :   Social Connections:   . Frequency of Communication with Friends and Family:   . Frequency of Social Gatherings with Friends and Family:   . Attends Religious Services:   . Active Member of Clubs or Organizations:   . Attends Archivist Meetings:   [name] Marital Status:   Intimate Partner Violence:   . Fear of Current or Ex-Partner:   . Emotionally Abused:   [name] Physically Abused:   . Sexually Abused:     Family History  Problem Relation Age of Onset  . Breast cancer Mother   . Hypertension Mother   . Hyperlipidemia Mother   . Diabetes Mother   . Prostate cancer Father   . Colon polyps Father   . Hyperlipidemia Father   . Crohn's disease Paternal Aunt   . Diabetes Maternal Grandmother   . Diabetes Brother        x 3  . Heart attack Brother   . Heart disease Brother        x 3  . Heart attack Brother   . Hyperlipidemia Brother        x 3  . Alcohol abuse Daughter   . Drug abuse Daughter   . Asthma Brother   . CVA Brother   . Colon cancer Neg Hx   . Esophageal cancer Neg Hx   . Rectal cancer Neg Hx   . Stomach cancer Neg Hx     Past Medical History:  Diagnosis Date  . Allergy   . Anxiety   . Carotid artery occlusion    40-60% right ICA stenosis (09/2008)  . Cataract   . Chronic back pain   . CLL (chronic lymphoblastic leukemia) dx 2010   Followed at [facility] q2mo no current therapy   . Clotting disorder ([facility]   . Coronary artery disease 2010   s/p CABG '10, sees Dr. HPercival Spanish . Depression   . Diverticulosis   . DVT, lower extremity, recurrent ([facility] 2008, 2009   LLE, chronic anticoag since  2009  . Esophagitis   . Fibromyalgia   . Gallstones   . GERD (gastroesophageal reflux disease)   . Gout   . Gynecomastia, male   . H/O hiatal hernia 2008   surgery  . Hemorrhoids   . Hepatitis A yrs ago  . HIV infection ([facility] dx 1993  . Hypertension   . Impotence  of organic origin   . Myocardial infarction ([place]) 2010    x 2  . Neuromuscular disorder ([facility])    neuropathy  . Osteoarthritis, knee    s/p B TKA  . Osteoporosis   . Rheumatoid arthritis(714.0) dx 2010   MTX, follows with rheum  . Seasonal allergies   . Secondary syphilis 07/24/14 dx   s/p 2 wks doxy  . Status post dilation of esophageal narrowing   . TIA (transient ischemic attack) 1997   mild residual L mouth droop  . Tubular adenoma of colon     Past Surgical History:  Procedure Laterality Date  . ANTERIOR HIP REVISION Left 08/17/2018   Procedure: ANTERIOR LEFT HIP REVISION;  Surgeon: Mcarthur Rossetti, MD;  Location: [facility];  Service: Orthopedics;  Laterality: Left;  . ANTERIOR HIP REVISION Left 11/02/2018   Procedure: LEFT HIP CONSTRAINED LINER REVISION;  Surgeon: Mcarthur Rossetti, MD;  Location: [facility];  Service: Orthopedics;  Laterality: Left;  . CHOLECYSTECTOMY    . COLONOSCOPY WITH PROPOFOL N/A 12/28/2012   Procedure: COLONOSCOPY WITH PROPOFOL;  Surgeon: Jerene Bears, MD;  [facility];  Service: Gastroenterology;  Laterality: N/A;  . CORONARY ARTERY BYPASS GRAFT  2010   triple bypass  . ESOPHAGOGASTRODUODENOSCOPY (EGD) WITH PROPOFOL N/A 12/28/2012   Procedure: ESOPHAGOGASTRODUODENOSCOPY (EGD) WITH PROPOFOL;  Surgeon: Jerene Bears, MD;  [facility];  Service: Gastroenterology;  Laterality: N/A;  . ESOPHAGOGASTRODUODENOSCOPY (EGD) WITH PROPOFOL N/A 03/15/2013   Procedure: ESOPHAGOGASTRODUODENOSCOPY (EGD) WITH PROPOFOL;  Surgeon: Jerene Bears, MD;  [facility];  Service: Gastroenterology;  Laterality: N/A;  . ESOPHAGOGASTRODUODENOSCOPY (EGD) WITH PROPOFOL N/A 02/07/2016     Procedure: ESOPHAGOGASTRODUODENOSCOPY (EGD) WITH PROPOFOL;  Surgeon: Jerene Bears, MD;  [facility];  Service: Gastroenterology;  Laterality: N/A;  . HARDWARE REMOVAL N/A 07/02/2012   Procedure: HARDWARE REMOVAL;  Surgeon: Elaina Hoops, MD;  Location: [facility];  Service: Neurosurgery;  Laterality: N/A;  . HIATAL HERNIA REPAIR     wrap  . HIP CLOSED REDUCTION Left 08/15/2018   Procedure: CLOSED REDUCTION HIP;  Surgeon: Newt Minion, MD;  Location: [facility];  Service: Orthopedics;  Laterality: Left;  . HIP CLOSED REDUCTION Left 08/28/2018   Procedure: CLOSED REDUCTION HIP FOR RECURRENT DISLOCATION AND DRESSING CHANGE;  Surgeon: Jessy Oto, MD;  Location: [facility];  Service: Orthopedics;  Laterality: Left;  . HIP CLOSED REDUCTION Left 09/15/2018   Procedure: CLOSED REDUCTION HIP DISLOCATION;  Surgeon: Mcarthur Rossetti, MD;  Location: [facility];  Service: Orthopedics;  Laterality: Left;  . HIP CLOSED REDUCTION Left 10/31/2018   Procedure: CLOSED REDUCTION LEFT TOTAL HIP;  Surgeon: Leandrew Koyanagi, MD;  Location: [facility];  Service: Orthopedics;  Laterality: Left;  . INGUINAL HERNIA REPAIR Bilateral   . JOINT REPLACEMENT Left 1999  . KNEE ARTHROPLASTY  07/22/2011   Procedure: COMPUTER ASSISTED TOTAL KNEE ARTHROPLASTY;  Surgeon: Meredith Pel, MD;  Location: [facility];  Service: Orthopedics;  Laterality: Right;  Right total knee arthroplasty  . MANDIBLE SURGERY Bilateral    tmj  . REPLACEMENT TOTAL KNEE Bilateral   . ring around testicle hernia reapir  184 and 1986   x 2  . ROTATOR CUFF REPAIR Right   . SHOULDER SURGERY Left   . SPINE SURGERY  2010   "rod and screws", "failed", lopwer spine,   . stent to heart x 1  2010  . TONSILLECTOMY    . TOTAL HIP ARTHROPLASTY Left 08/13/2018  Procedure: LEFT TOTAL HIP ARTHROPLASTY ANTERIOR APPROACH;  Surgeon: Mcarthur Rossetti, MD;  Location: [facility];  Service: Orthopedics;  Laterality: Left;  . TOTAL HIP ARTHROPLASTY Left 09/17/2018    Procedure: ANTERIOR LEFT HIP REVISION;  Surgeon: Mcarthur Rossetti, MD;  Location: [facility];  Service: Orthopedics;  Laterality: Left;  . UMBILICAL HERNIA REPAIR     x 1  . varicose vein     stripping  . VIDEO BRONCHOSCOPY Bilateral 10/16/2015   Procedure: VIDEO BRONCHOSCOPY WITHOUT FLUORO;  Surgeon: Brand Males, MD;  [facility];  Service: Cardiopulmonary;  Laterality: Bilateral;    Current Outpatient Medications  Medication Sig Dispense Refill  . acetaminophen (TYLENOL) 500 MG tablet Take 500-1,000 mg by mouth every 6 (six) hours as needed for mild pain or moderate pain.     [name] albuterol (PROVENTIL) (2.5 MG/3ML) 0.083% nebulizer solution Take 3 mLs (2.5 mg total) by nebulization every 6 (six) hours as needed for wheezing or shortness of breath. 150 mL 1  . atorvastatin (LIPITOR) 10 MG tablet TAKE 1 TABLET BY MOUTH AT BEDTIME 30 tablet 9  . clindamycin (CLEOCIN) 300 MG capsule Take two by mouth one hour before dental appointment, then two by mouth six hours after appointment 12 capsule 0  . Dolutegravir-lamiVUDine (DOVATO) 50-300 MG TABS Take 1 tablet by mouth daily. 90 tablet 3  . EPINEPHrine (EPIPEN 2-PAK) 0.3 mg/0.3 mL IJ SOAJ injection Inject 0.3 mg into the muscle as needed for anaphylaxis.    [name] escitalopram (LEXAPRO) 20 MG tablet TAKE 1 TABLET BY MOUTH EVERY DAY 30 tablet 5  . famotidine (PEPCID) 40 MG tablet TAKE 1 TABLET BY MOUTH TWICE A DAY 016 tablet 0  . folic acid (FOLVITE) 1 MG tablet Take 1 mg by mouth daily.  11  . Glycopyrrolate (LONHALA MAGNAIR REFILL KIT) 25 MCG/ML SOLN Inhale 1 Act into the lungs in the morning and at bedtime. 60 mL 5  . HYDROcodone-acetaminophen (NORCO) 7.5-325 MG tablet Take 1 tablet by mouth every 6 (six) hours as needed for severe pain (pain score 7-10). 40 tablet 0  . leflunomide (ARAVA) 20 MG tablet Take 20 mg by mouth daily.     . metoprolol succinate (TOPROL-XL) 50 MG 24 hr tablet Take 1 tablet (50 mg total) by mouth daily. 90 tablet  3  . ondansetron (ZOFRAN) 4 MG tablet Take 4 mg by mouth every 6 (six) hours as needed for nausea.    . predniSONE (DELTASONE) 5 MG tablet Take 5 mg by mouth 2 (two) times daily.   1  . pregabalin (LYRICA) 200 MG capsule TAKE 1 CAPSULE BY MOUTH TWICE A DAY 60 capsule 3  . SAVAYSA 30 MG TABS tablet TAKE 1 TABLET (30 MG TOTAL) BY MOUTH DAILY. 30 tablet 5  . sulfamethoxazole-trimethoprim (BACTRIM DS) 800-160 MG tablet Take 1 tablet by mouth every 12 (twelve) hours. 60 tablet 3  . torsemide (DEMADEX) 20 MG tablet TAKE 1 TABLET BY MOUTH EVERY DAY 90 tablet 1  . valACYclovir (VALTREX) 500 MG tablet TAKE 1 TABLET BY MOUTH EVERY DAY 30 tablet 2  . nitroGLYCERIN (NITROSTAT) 0.4 MG SL tablet Place 1 tablet (0.4 mg total) under the tongue every 5 (five) minutes as needed for chest pain. 25 tablet 2   No current facility-administered medications for this visit.    Allergies as of 08/17/2019 - Review Complete 08/17/2019  Allergen Reaction Noted  . Golimumab Anaphylaxis 01/21/2018  . Orencia [abatacept] Anaphylaxis 02/15/2018  . Other Anaphylaxis and Hives 07/08/2011  .  Peanut-containing drug products Anaphylaxis, Hives, and Swelling 06/23/2012  . Morphine Other (See Comments) 08/13/2016  . Oxycodone-acetaminophen Other (See Comments)   . Penicillins Rash and Other (See Comments)   . Promethazine hcl Other (See Comments)    ADDENDUM: I was just given the original documents that the patient's neurosurgeon had already faxed to our office figure not available to me and this morning's visit with [name].  Dr. Viviano Simas reported that the patient had an electrodiagnostic study by [facility] in 2013 ( Dr. Krista Blue )  and a previous study in 2006 each of them apparently have documented peripheral polyneuropathy which has been progressing.  Also there was a rather gradual progress over time he feels that over the last 6 months it has been more acutely changing.  He does not have any longer lower  extremity reflexes as I have also found out.  He has foot intrinsic muscle atrophy hand intrinsic muscle atrophy and he is no longer able to walk.  The electrodiagnostic study showed chronic neurogenic motor unit changes and motor unit dropout distal to the knee in both sides.  Additionally the left iliopsoas muscle was denervated with chronic neurogenic motor unit changes the paraspinals were normal bilaterally.   Diagnosis was that of a very severe, length dependent sensory and motor polyneuropathy-  Dr. Brien Few described this as an axonal loss mediated PN which can also occur after longstanding demyelination.  The very first obtainable study from 2006 already spoke of a stocking distribution study in 2013 documented severe polyneuropathy involving both lower extremities and beginning at the upper extremities as well.   There is denervation potential in many muscles noted, intrinsic muscle atrophy in upper and lower extremities.  Clearly related to neuropathy.   Weakness without radiculopathy -   Vitals: BP 129/77   Pulse 69   Temp (!) 97.2 F (36.2 C)   Ht _0  (1.753 m)   Wt 180 lb (81.6 kg)   BMI 26.58 kg/m  Last Weight:  Wt Readings from Last 1 Encounters:  08/17/19 180 lb (81.6 kg)   Last Height:   Ht Readings from Last 1 Encounters:  08/17/19 _1  (1.753 m)    Physical exam:  General: The patient is awake, alert and appears not in acute distress. The patient is well groomed. Head: Normocephalic, atraumatic. Neck is supple.  Cardiovascular:  Regular rate and rhythm, without  murmurs or carotid bruit, and without distended neck veins. Respiratory: Lungs are clear to auscultation. Skin:  Without evidence of edema, or rash. Trunk: BMI is 27  patient  has scoliosis and is I on power scooter , is using this since early March 2020 .  Neurologic exam : The patient is awake and alert, oriented to place and time.  Memory subjective described as impaired for short term.  There is a  normal attention span & concentration ability. Speech is fluent without dysarthria, mild dysphonia but not  aphasia. Mood and affect are appropriate.  Cranial nerves: sense of smell and taste are bland.  Pupils are equal and briskly reactive to light. Funduscopic exam without   evidence of pallor or edema. Extraocular movements  in vertical and horizontal planes intact and without nystagmus. Diplopia with downward gaze, skewed images.  Visual fields by finger perimetry are intact. Hearing aids in situ.  Facial sensation intact to fine touch. Facial motor strength is symmetric and tongue and uvula move midline.  Tongue protrusion into either cheek is normal. Shoulder shrug is normal.  Motor exam:   Very arthritic hands, muscle atrophy, grip strength loss.  Unable to left either foot, can no longer drive. Now on electric scooter.     Sensory:  Hands are numb to all modalities.   He can still feel temperature in his hands, not in his feet.  Feet are profoundly numb- up to the knees. Has ongoing electric shock sensation ascending form his fee.t   Hands are numb to all modalities.  He can still feel temperature in his hands, not in his feet.  Fine touch, pinprick and vibration were tested in all extremities.   Coordination: Rapid alternating movements in the fingers/hands were normal.  Finger-to-nose maneuver with evidence of ataxia, left more than right dysmetria , and tremor.  Gait and station: Patient walks with assistance and is unable unassisted to climb up to the exam table.  Power scooter needed - he could barely get up to the scale. Falls backwards when closing his eyes, retropulsion.   Deep tendon reflexes: in the  upper extremities are symmetric and intact. Absent in the lower extremities. Babinski maneuver response is equivocal.  Assessment:  After physical and neurologic examination, review of laboratory studies, imaging, neurophysiology testing and pre-existing records, assessment  is that of :   Diagnosis: this is not an acute polyneuropathy-  this is a length dependent retroviral medication induced or viral induced polyneuropathy. Not ideopathic, not hereditary.  It will progress further . We will try methyl B 12 supplements.    Christopher Partridge Greenley Martone MD 08/17/2019    Cc: Drs Brien Few, Pia Mau.

## 2019-08-18 ENCOUNTER — Other Ambulatory Visit: Payer: Self-pay | Admitting: Pharmacist

## 2019-08-18 ENCOUNTER — Telehealth: Payer: Self-pay

## 2019-08-18 DIAGNOSIS — B20 Human immunodeficiency virus [HIV] disease: Secondary | ICD-10-CM

## 2019-08-18 MED ORDER — PREZCOBIX 800-150 MG PO TABS: 1.0000 | ORAL_TABLET | Freq: Every day | ORAL | 11 refills | Status: DC

## 2019-08-18 MED ORDER — BIKTARVY 50-200-25 MG PO TABS: 1.0000 | ORAL_TABLET | Freq: Every day | ORAL | 11 refills | Status: DC

## 2019-08-18 NOTE — Progress Notes (Signed)
See telephone note.

## 2019-08-18 NOTE — Telephone Encounter (Signed)
I spoke with [name] this afternoon about his HIV resistance report. He was recently prescribed a two-drug regimen with Dovato, but he has a high level of resistance to lamivudine. With this information, the decision was made to discontinue Dovato and start treatment with Biktarvy and Prezcobix. I advised him to take both Prezcobix and Biktarvy tablets at the same time every day with food, never taking one without the other. I also advised him to separate these medications from multivitamins, TUMS, or other supplements if he should start taking any. He should contact the clinic if he starts any new medications due to the risk of drug interactions with Prezcobix. He was informed of the most common adverse effects of taking these medications, and all questions/concerns were addressed at this time. Cassie will send in the prescriptions to [facility] in [place].   Agnes Lawrence, PharmD PGY1 Pharmacy Resident

## 2019-08-19 ENCOUNTER — Encounter: Payer: Self-pay | Admitting: Internal Medicine

## 2019-08-19 NOTE — Telephone Encounter (Signed)
Thank you Emily.

## 2019-08-19 NOTE — Telephone Encounter (Signed)
Thanks so much. I really appreciate it.

## 2019-08-22 ENCOUNTER — Encounter: Payer: Self-pay | Admitting: Orthopaedic Surgery

## 2019-08-23 ENCOUNTER — Telehealth: Payer: Self-pay

## 2019-08-23 NOTE — Telephone Encounter (Signed)
Mr. Suchanek called this morning with questions regarding his HIV medication regimen. His daughter picked up all of his medications from the pharmacy which included Dovato, Biktarvy, and Prezcobix. He thought that he was supposed to take the Prezcobix and Dovato and discontinue the [place]. I advised him to discontinue the Dovato and start taking Biktarvy and Preszcobix. I reiterated the importance of taking the [place] with Prezcobix at the same time every day with food, never taking one without the other. I let him know he could bring the Dovato into the clinic so that he doesn't get it confused with his other HIV medications, but he states he will set it aside for now. He also expressed concerns with taking his new I-methylfolate-B6-B12 supplement with his HIV medications since this is prescribed twice daily with food. I let him know that there is little to no interaction between this medication and his HIV medications and they can be safely taken together if needed. All of his questions and concerns were addressed at this time. He should call the clinic if he has any further questions.   Agnes Lawrence, PharmD PGY1 Pharmacy Resident

## 2019-08-24 ENCOUNTER — Encounter: Payer: Self-pay | Admitting: Internal Medicine

## 2019-08-26 ENCOUNTER — Encounter: Payer: Self-pay | Admitting: Internal Medicine

## 2019-08-26 ENCOUNTER — Telehealth: Payer: Self-pay | Admitting: *Deleted

## 2019-08-26 NOTE — Care Plan (Signed)
CM call to patient for 1 year original Post-op date for R-THA done on 08/13/19 with Dr. Ninfa Linden. CM has held calling patient as he was having increased pain and a CT Scan per Dr. Ninfa Linden of this hip. Patient verbalized he did have CT done. Next appointment with Dr. Ninfa Linden is scheduled for August. Discussed a sooner appointment to review CT results and what is the next thing that can be done. [place] desk staff will assist in making a new appointment for him. 1 year survey reviewed.

## 2019-08-26 NOTE — Telephone Encounter (Signed)
Ortho bundle CM call at 1 year post op.

## 2019-08-31 ENCOUNTER — Other Ambulatory Visit: Payer: Self-pay

## 2019-08-31 ENCOUNTER — Ambulatory Visit (INDEPENDENT_AMBULATORY_CARE_PROVIDER_SITE_OTHER): Payer: Medicare Other | Admitting: Orthopaedic Surgery

## 2019-08-31 ENCOUNTER — Encounter: Payer: Self-pay | Admitting: Orthopaedic Surgery

## 2019-08-31 DIAGNOSIS — Z96642 Presence of left artificial hip joint: Secondary | ICD-10-CM | POA: Diagnosis not present

## 2019-08-31 DIAGNOSIS — I2510 Atherosclerotic heart disease of native coronary artery without angina pectoris: Secondary | ICD-10-CM

## 2019-08-31 DIAGNOSIS — M25552 Pain in left hip: Secondary | ICD-10-CM | POA: Diagnosis not present

## 2019-08-31 NOTE — Progress Notes (Signed)
The patient comes in today to go over the most recent CT scanning of his left hip.  He has a complicated history in terms of a left total hip arthroplasty that was done in April of last year.  This required multiple revisions of the acetabular component due to chronic dislocations.  He is someone with a complex spine and pelvis history with a fusion of his spine to his pelvis and sacrum.  He then finally was doing well until a mechanical fall.  I have had one CT scan in December I repeated the CT scan.  He did have some fractures of his pelvis.  The CT scan does show some loosening around some of the screws.  The acetabulum clinic has migrated some superior and medial as well.  The femoral component is in good position.  There is some evidence of myositis ossificans.  This can also be a source of his pain.  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  Any type of weightbearing is causing pain in the groin area.  At this point one option is to stay conservative with minimal weightbearing and minimal activities continuing anti-inflammatories and pain medication.  Another option would be considering a revision surgery with bone grafting and a large dual mobility acetabular component.  I showed him models of this and explained in detail what this type of surgery involves.  It would certainly be still quite complicated given his history of dislocations in the past combined with his bone quality and fusion of his spine to the sacrum.  He states he will think about this and let us know.  I do feel that this is the last surgical option.

## 2019-09-01 ENCOUNTER — Encounter: Payer: Self-pay | Admitting: Orthopaedic Surgery

## 2019-09-04 ENCOUNTER — Other Ambulatory Visit: Payer: Self-pay | Admitting: Internal Medicine

## 2019-09-08 ENCOUNTER — Ambulatory Visit: Payer: Medicare Other | Admitting: Internal Medicine

## 2019-09-08 ENCOUNTER — Other Ambulatory Visit: Payer: Self-pay | Admitting: Infectious Diseases

## 2019-09-08 DIAGNOSIS — B20 Human immunodeficiency virus [HIV] disease: Secondary | ICD-10-CM

## 2019-09-11 ENCOUNTER — Encounter: Payer: Self-pay | Admitting: Orthopaedic Surgery

## 2019-09-12 ENCOUNTER — Encounter: Payer: Self-pay | Admitting: Internal Medicine

## 2019-09-12 ENCOUNTER — Ambulatory Visit (INDEPENDENT_AMBULATORY_CARE_PROVIDER_SITE_OTHER): Payer: Medicare Other | Admitting: Internal Medicine

## 2019-09-12 ENCOUNTER — Other Ambulatory Visit: Payer: Self-pay

## 2019-09-12 VITALS — BP 120/70 | HR 77 | Temp 98.5°F | Ht 69.0 in | Wt 174.2 lb

## 2019-09-12 DIAGNOSIS — T84020S Dislocation of internal right hip prosthesis, sequela: Secondary | ICD-10-CM | POA: Diagnosis not present

## 2019-09-12 DIAGNOSIS — G609 Hereditary and idiopathic neuropathy, unspecified: Secondary | ICD-10-CM | POA: Diagnosis not present

## 2019-09-12 DIAGNOSIS — M87052 Idiopathic aseptic necrosis of left femur: Secondary | ICD-10-CM | POA: Diagnosis not present

## 2019-09-12 DIAGNOSIS — I2510 Atherosclerotic heart disease of native coronary artery without angina pectoris: Secondary | ICD-10-CM

## 2019-09-12 DIAGNOSIS — J189 Pneumonia, unspecified organism: Secondary | ICD-10-CM | POA: Diagnosis not present

## 2019-09-12 MED ORDER — XENLETA 600 MG PO TABS: 1.0000 | ORAL_TABLET | Freq: Two times a day (BID) | ORAL | 0 refills | Status: DC

## 2019-09-12 MED ORDER — XENLETA 600 MG PO TABS: 1.0000 | ORAL_TABLET | Freq: Every day | ORAL | 0 refills | Status: DC

## 2019-09-12 NOTE — Patient Instructions (Signed)
Community-Acquired Pneumonia, Adult Pneumonia is an infection of the lungs. It causes swelling in the airways of the lungs. Mucus and fluid may also build up inside the airways. One type of pneumonia can happen while a person is in a hospital. A different type can happen when a person is not in a hospital (community-acquired pneumonia).  What are the causes?  This condition is caused by germs (viruses, bacteria, or fungi). Some types of germs can be passed from one person to another. This can happen when you breathe in droplets from the cough or sneeze of an infected person. What increases the risk? You are more likely to develop this condition if you:  Have a long-term (chronic) disease, such as: ? Chronic obstructive pulmonary disease (COPD). ? Asthma. ? Cystic fibrosis. ? Congestive heart failure. ? Diabetes. ? Kidney disease.  Have HIV.  Have sickle cell disease.  Have had your spleen removed.  Do not take good care of your teeth and mouth (poor dental hygiene).  Have a medical condition that increases the risk of breathing in droplets from your own mouth and nose.  Have a weakened body defense system (immune system).  Are a smoker.  Travel to areas where the germs that cause this illness are common.  Are around certain animals or the places they live. What are the signs or symptoms?  A dry cough.  A wet (productive) cough.  Fever.  Sweating.  Chest pain. This often happens when breathing deeply or coughing.  Fast breathing or trouble breathing.  Shortness of breath.  Shaking chills.  Feeling tired (fatigue).  Muscle aches. How is this treated? Treatment for this condition depends on many things. Most adults can be treated at home. In some cases, treatment must happen in a hospital. Treatment may include:  Medicines given by mouth or through an IV tube.  Being given extra oxygen.  Respiratory therapy. In rare cases, treatment for very bad pneumonia  may include:  Using a machine to help you breathe.  Having a procedure to remove fluid from around your lungs. Follow these instructions at home: Medicines  Take over-the-counter and prescription medicines only as told by your doctor. ? Only take cough medicine if you are losing sleep.  If you were prescribed an antibiotic medicine, take it as told by your doctor. Do not stop taking the antibiotic even if you start to feel better. General instructions   Sleep with your head and neck raised (elevated). You can do this by sleeping in a recliner or by putting a few pillows under your head.  Rest as needed. Get at least 8 hours of sleep each night.  Drink enough water to keep your pee (urine) pale yellow.  Eat a healthy diet that includes plenty of vegetables, fruits, whole grains, low-fat dairy products, and lean protein.  Do not use any products that contain nicotine or tobacco. These include cigarettes, e-cigarettes, and chewing tobacco. If you need help quitting, ask your doctor.  Keep all follow-up visits as told by your doctor. This is important. How is this prevented? A shot (vaccine) can help prevent pneumonia. Shots are often suggested for:  People older than 71 years of age.  People older than 71 years of age who: ? Are having cancer treatment. ? Have long-term (chronic) lung disease. ? Have problems with their body's defense system. You may also prevent pneumonia if you take these actions:  Get the flu (influenza) shot every year.  Go to the dentist as   often as told.  Wash your hands often. If you cannot use soap and water, use hand sanitizer. Contact a doctor if:  You have a fever.  You lose sleep because your cough medicine does not help. Get help right away if:  You are short of breath and it gets worse.  You have more chest pain.  Your sickness gets worse. This is very serious if: ? You are an older adult. ? Your body's defense system is weak.  You  cough up blood. Summary  Pneumonia is an infection of the lungs.  Most adults can be treated at home. Some will need treatment in a hospital.  Drink enough water to keep your pee pale yellow.  Get at least 8 hours of sleep each night. This information is not intended to replace advice given to you by your health care provider. Make sure you discuss any questions you have with your health care provider. Document Revised: 08/18/2018 Document Reviewed: 12/24/2017 Elsevier Patient Education  2020 [employer]  

## 2019-09-12 NOTE — Progress Notes (Addendum)
Subjective:  Patient ID: [name], male    DOB: 04/23/1951  Age: 69 y.o. MRN: [id number]  CC: Cough  This visit occurred during the SARS-CoV-2 public health emergency.  Safety protocols were in place, including screening questions prior to the visit, additional usage of staff PPE, and extensive cleaning of exam room while observing appropriate contact time as indicated for disinfecting solutions.    HPI [name] presents for the complaint of a 5 day hx of sore throat, cough productive of thick yellow phlegm, and SOB.  He is taking sulfamethoxazole/trimethoprim for pneumocystis prophylaxis.  He has upcoming orthopedic surgery and says he needs to get this infection treated so that he can have his surgery.  He is allergic to penicillin.  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 In the home a cane, walker or crutch will not resolve issue with performing activities of daily living. An electric wheelchair will allow patient to safely perform dialy activities. Patient can not safely propel a manual wheelchair themselves due to weakness, pain and endurance. Patient is not able to use a manual wheelchair due to physiological factors and patient is not able to lift themselves out of a wheelchair safely. An electric wheelchair is the safest option for our patient. Patient has hand and finger dexterity to use the joy stick of an electric wheelchair. Patient pain is currently 7/10. Patient is mentally capable of understanding all instructions regarding operation and safety required for an electric wheelchair.    Outpatient Medications Prior to Visit  Medication Sig Dispense Refill  .  acetaminophen (TYLENOL) 500 MG tablet Take 500-1,000 mg by mouth every 6 (six) hours as needed for mild pain or moderate pain.     [name] albuterol (PROVENTIL) (2.5 MG/3ML) 0.083% nebulizer solution Take 3 mLs (2.5 mg total) by nebulization every 6 (six) hours as needed for wheezing or shortness of breath. 150 mL 1  . atorvastatin (LIPITOR) 10 MG tablet TAKE 1 TABLET BY MOUTH AT BEDTIME (Patient taking differently: Take 10 mg by mouth at bedtime. ) 30 tablet 9  . bictegravir-emtricitabine-tenofovir AF (BIKTARVY) 50-200-25 MG TABS tablet Take 1 tablet by mouth daily. 30 tablet 11  . clindamycin (CLEOCIN) 300 MG capsule Take two by mouth one hour before dental appointment, then two by mouth six hours after appointment (Patient taking differently: Take 600 mg by mouth See admin instructions. Take 600 mg by mouth one hour before dental appointment, then 600 mg by mouth six hours after appointment) 12 capsule 0  . darunavir-cobicistat (PREZCOBIX) 800-150 MG tablet Take 1 tablet by mouth daily with breakfast. Swallow whole. Do NOT crush, break or chew tablets. Take with food. 30 tablet 11  . EPINEPHrine (EPIPEN 2-PAK) 0.3 mg/0.3 mL IJ SOAJ injection Inject 0.3 mg into the muscle as needed for anaphylaxis.    [name] escitalopram (LEXAPRO) 20 MG tablet TAKE 1 TABLET BY MOUTH EVERY DAY (Patient taking differently: Take 20 mg by mouth daily. ) 30 tablet 5  . famotidine (PEPCID) 40 MG tablet TAKE 1 TABLET BY MOUTH TWICE A DAY (Patient taking differently: Take 40 mg by mouth 2 (two) times daily. ) 010 tablet 0  . folic acid (FOLVITE) 1 MG tablet Take 1 mg by mouth daily.  11  .  Glycopyrrolate (LONHALA MAGNAIR REFILL KIT) 25 MCG/ML SOLN Inhale 1 Act into the lungs in the morning and at bedtime. 60 mL 5  . l-methylfolate-B6-B12 (METANX) 3-35-2 MG TABS tablet Take 1 tablet by mouth 2 (two) times daily. 60 tablet 5  . leflunomide (ARAVA) 20 MG tablet Take 20 mg by mouth daily.     . metoprolol succinate (TOPROL-XL) 50 MG 24 hr  tablet Take 1 tablet (50 mg total) by mouth daily. 90 tablet 3  . nitroGLYCERIN (NITROSTAT) 0.4 MG SL tablet Place 1 tablet (0.4 mg total) under the tongue every 5 (five) minutes as needed for chest pain. 25 tablet 2  . ondansetron (ZOFRAN) 4 MG tablet Take 4 mg by mouth every 6 (six) hours as needed for nausea.    . predniSONE (DELTASONE) 5 MG tablet Take 5 mg by mouth 2 (two) times daily.   1  . pregabalin (LYRICA) 200 MG capsule TAKE 1 CAPSULE BY MOUTH TWICE A DAY (Patient taking differently: Take 200 mg by mouth 2 (two) times daily. ) 60 capsule 3  . SAVAYSA 30 MG TABS tablet TAKE 1 TABLET (30 MG TOTAL) BY MOUTH DAILY. 30 tablet 5  . sulfamethoxazole-trimethoprim (BACTRIM DS) 800-160 MG tablet Take 1 tablet by mouth every 12 (twelve) hours. 60 tablet 3  . torsemide (DEMADEX) 20 MG tablet TAKE 1 TABLET BY MOUTH EVERY DAY (Patient taking differently: Take 20 mg by mouth daily. ) 90 tablet 1  . valACYclovir (VALTREX) 500 MG tablet TAKE 1 TABLET BY MOUTH EVERY DAY (Patient taking differently: Take 500 mg by mouth daily. ) 30 tablet 2  . HYDROcodone-acetaminophen (NORCO) 7.5-325 MG tablet Take 1 tablet by mouth every 6 (six) hours as needed for severe pain (pain score 7-10). (Patient not taking: Reported on 09/09/2019) 40 tablet 0   No facility-administered medications prior to visit.    ROS Review of Systems  Constitutional: Negative for chills, diaphoresis, fatigue and fever.  HENT: Positive for sinus pressure, sinus pain and sore throat. Negative for rhinorrhea and trouble swallowing.   Eyes: Negative.   Respiratory: Positive for cough and shortness of breath.   Cardiovascular: Negative for chest pain, palpitations and leg swelling.  Gastrointestinal: Negative for abdominal pain, constipation, diarrhea, nausea and vomiting.  Endocrine: Negative.   Genitourinary: Negative.  Negative for difficulty urinating.  Musculoskeletal: Positive for arthralgias and gait problem. Negative for myalgias.    Skin: Negative.  Negative for color change and rash.  Neurological: Positive for weakness. Negative for dizziness and light-headedness.  Hematological: Negative for adenopathy. Does not bruise/bleed easily.  Psychiatric/Behavioral: Negative.     Objective:  BP 120/70 (BP Location: Left Arm, Patient Position: Sitting, Cuff Size: Normal)   Pulse 77   Temp 98.5 F (36.9 C) (Oral)   Ht '5\' 9"'$  (1.753 m)   Wt 174 lb 4 oz (79 kg)   SpO2 93%   BMI 25.73 kg/m   BP Readings from Last 3 Encounters:  09/12/19 120/70  08/17/19 129/77  08/16/19 100/60    Wt Readings from Last 3 Encounters:  09/12/19 174 lb 4 oz (79 kg)  08/17/19 180 lb (81.6 kg)  08/16/19 184 lb (83.5 kg)    Physical Exam Vitals reviewed.  HENT:     Nose: Nose normal.     Mouth/Throat:     Mouth: Mucous membranes are moist.  Eyes:     General: No scleral icterus.    Conjunctiva/sclera: Conjunctivae normal.  Cardiovascular:     Rate and Rhythm: Normal  rate and regular rhythm.  Pulmonary:     Effort: Pulmonary effort is normal. No tachypnea, accessory muscle usage or respiratory distress.     Breath sounds: No decreased air movement. Examination of the left-lower field reveals rales. Rales present. No decreased breath sounds, wheezing or rhonchi.  Musculoskeletal:     Cervical back: Neck supple.  Lymphadenopathy:     Cervical: No cervical adenopathy.  Neurological:     Mental Status: He is alert.     Cranial Nerves: Cranial nerves are intact.     Sensory: Sensory deficit present.     Motor: Weakness and atrophy present. No seizure activity.     Coordination: Romberg sign positive. Coordination abnormal.     Comments: Strength: RUE 4/5   LUE 4/5                 RLE 2/5   LLE 1/5     Lab Results  Component Value Date   WBC 10.5 08/16/2019   HGB 12.3 (L) 08/16/2019   HCT 39.6 08/16/2019   PLT 123 (L) 08/16/2019   GLUCOSE 90 08/16/2019   CHOL 214 (H) 08/04/2019   TRIG 180 (H) 08/04/2019   HDL 40  08/04/2019   LDLCALC 142 (H) 08/04/2019   ALT 12 08/16/2019   AST 16 08/16/2019   NA 141 08/16/2019   K 3.9 08/16/2019   CL 102 08/16/2019   CREATININE 0.90 08/16/2019   BUN 10 08/16/2019   CO2 29 08/16/2019   TSH 1.830 06/06/2019   INR 1.61 11/19/2017   HGBA1C 5.1 11/16/2018   MICROALBUR 2.6 (H) 11/16/2018    CT HIP LEFT WO CONTRAST  Result Date: 08/12/2019 CLINICAL DATA:  Progressive left hip pain. Multiple prior left hip arthroplasty revision surgeries. EXAM: CT OF THE LEFT HIP WITHOUT CONTRAST TECHNIQUE: Multidetector CT imaging of the left hip was performed according to the standard protocol. Multiplanar CT image reconstructions were also generated. COMPARISON:  Left hip x-rays dated August 04, 2019. CT left hip dated May 11, 2019. FINDINGS: Bones/Joint/Cartilage Prior left hip revision arthroplasty. Nearly healed periprosthetic fracture involving the right iliac bone. Unchanged loosening of the acetabular component screws with medial and superior migration of the acetabular component. The femoral stem component is unremarkable without surrounding lucency or periprosthetic fracture. Ligaments Ligaments are suboptimally evaluated by CT. Muscles and Tendons Chronic thickening and dystrophic calcification within the left iliacus muscle appears decreased when compared to the prior study. Subtle lobulated 2.3 x 2.7 x 2.3 cm area within the muscle with faint rim calcification. Prominent left gluteal and adductor muscle atrophy again noted. Soft tissue Unchanged prominent soft tissue density and scarring around the left hip joint. Slight interval decrease in the amount of fluid within the left greater trochanteric bursa. Mildly enlarged right external iliac lymph nodes are stable or slightly decreased in size when compared to the prior study. Right hydrocele. IMPRESSION: 1. Nearly healed periprosthetic fracture involving the right iliac bone. 2. Unchanged loosening of the acetabular component with  medial and superior migration. 3. Chronic thickening and dystrophic calcification within the left iliacus muscle appears decreased when compared to the prior study. Subtle lobulated 2.3 x 2.7 x 2.3 cm area within the muscle with faint rim calcification may reflect a chronic hematoma or early myositis ossificans. 4. Mild left greater trochanteric bursitis, improved when compared to prior study. Electronically Signed   By: Titus Dubin M.D.   On: 08/12/2019 15:26    Assessment & Plan:   [name] was seen  today for cough.  Diagnoses and all orders for this visit:  Community acquired pneumonia, unspecified laterality- This is likely a resistant organism since he is already being treated with sulfamethoxazole/trimethoprim.  He is allergic to penicillin.  Will treat with lefamulin. -     Discontinue: Lefamulin Acetate (XENLETA) 600 MG TABS; Take 1 tablet by mouth daily for 5 days. -     Lefamulin Acetate (XENLETA) 600 MG TABS; Take 1 tablet by mouth in the morning and at bedtime for 5 days.   I have discontinued [name] HYDROcodone-acetaminophen. I am also having him maintain his predniSONE, folic acid, nitroGLYCERIN, leflunomide, acetaminophen, EPINEPHrine, ondansetron, Savaysa, atorvastatin, clindamycin, sulfamethoxazole-trimethoprim, escitalopram, torsemide, famotidine, metoprolol succinate, albuterol, [place] Magnair Refill Kit, valACYclovir, pregabalin, l-methylfolate-B6-B12, Biktarvy, Prezcobix, and Xenleta.  Meds ordered this encounter  Medications  . DISCONTD: Lefamulin Acetate (XENLETA) 600 MG TABS    Sig: Take 1 tablet by mouth daily for 5 days.    Dispense:  5 tablet    Refill:  0  . DISCONTD: Lefamulin Acetate (XENLETA) 600 MG TABS    Sig: Take 1 tablet by mouth in the morning and at bedtime for 5 days.    Dispense:  10 tablet    Refill:  0  . Lefamulin Acetate (XENLETA) 600 MG TABS    Sig: Take 1 tablet by mouth in the morning and at bedtime for 5 days.     Dispense:  10 tablet    Refill:  0     Follow-up: Return if symptoms worsen or fail to improve.  [name], MD

## 2019-09-13 MED ORDER — XENLETA 600 MG PO TABS: 1.0000 | ORAL_TABLET | Freq: Two times a day (BID) | ORAL | 0 refills | Status: AC

## 2019-09-13 NOTE — Progress Notes (Signed)
Please place surgery orders. Pt scheduled for PST appt tomorrow.

## 2019-09-13 NOTE — Progress Notes (Addendum)
PCP - [name], MD  Cardiologist - [name], MD  Infectious Disease- Dr. Scharlene Gloss  Last Telephone Encounter 08-23-19  Neurologist- Dr. Asencion Partridge Dohmeir-LOV 08-17-19  Chest x-ray - 08-01-19 EKG - 06-06-19 Stress Test -  ECHO - 11-30-18 Cardiac Cath -   Sleep Study -  CPAP -   Fasting Blood Sugar -  Checks Blood Sugar _____ times a day  Blood Thinner Instructions: Savaysa 30 mg -Prescribed by Dr. Ronnald Ramp (Pt to verify with Dr. Ronnald Ramp how many days he is to hold) Aspirin Instructions: Last Dose:  Anesthesia review: Hx of HIV, CAD, HTN, DVT,  MIPulmonary Fibrosis, chronic bronchitis  Patient denies shortness of breath, fever, cough and chest pain at PAT appointment   Patient verbalized understanding of instructions that were given to them at the PAT appointment. Patient was also instructed that they will need to review over the PAT instructions again at home before surgery.

## 2019-09-13 NOTE — Patient Instructions (Addendum)
DUE TO COVID-19 ONLY ONE VISITOR IS ALLOWED TO COME WITH YOU AND STAY IN THE WAITING ROOM ONLY DURING PRE OP AND PROCEDURE DAY OF SURGERY. THE 1 VISITOR MAY VISIT WITH YOU AFTER SURGERY IN YOUR PRIVATE ROOM DURING VISITING HOURS ONLY!  YOU NEED TO HAVE A COVID 19 TEST ON 09-20-19 @ 10:05 AM THIS TEST MUST BE DONE BEFORE SURGERY, COME  [address], [address] [place] , [address].  ([place]) ONCE YOUR COVID TEST IS COMPLETED, PLEASE BEGIN THE QUARANTINE INSTRUCTIONS AS OUTLINED IN YOUR HANDOUT.                [name]  09/13/2019   Your procedure is scheduled on: 09-23-19    Report to [facility] Main  Entrance    Report to Admitting at 9:45 AM     Call this number if you have problems the morning of surgery [phone]    Remember: Do not eat food or drink liquids :After Midnight.    Take these medicines the morning of surgery with A SIP OF WATER: Lexapro (Escitalopram), Famotidine (Pepcid), and Metoprolol (Toprol), Biktarvy, and Prezcobix   BRUSH YOUR TEETH MORNING OF SURGERY AND RINSE YOUR MOUTH OUT, NO CHEWING GUM CANDY OR MINTS.                               You may not have any metal on your body including hair pins and              piercings    Do not wear jewelry, cologne, lotions, powders or deodorant                     Men may shave face and neck.   Do not bring valuables to the hospital. [place].  Contacts, dentures or bridgework may not be worn into surgery.  You may bring an overnight bag       Special Instructions: N/A              Please read over the following fact sheets you were given: _____________________________________________________________________             [facility] - Preparing for Surgery Before surgery, you can play an important role.  Because skin is not sterile, your skin needs to be as free of germs as possible.  You can reduce the number of germs on your  skin by washing with CHG (chlorahexidine gluconate) soap before surgery.  CHG is an antiseptic cleaner which kills germs and bonds with the skin to continue killing germs even after washing. Please DO NOT use if you have an allergy to CHG or antibacterial soaps.  If your skin becomes reddened/irritated stop using the CHG and inform your nurse when you arrive at Short Stay. Do not shave (including legs and underarms) for at least 48 hours prior to the first CHG shower.  You may shave your face/neck. Please follow these instructions carefully:  1.  Shower with CHG Soap the night before surgery and the  morning of Surgery.  2.  If you choose to wash your hair, wash your hair first as usual with your  normal  shampoo.  3.  After you shampoo, rinse your hair and body thoroughly to remove the  shampoo.  4.  Use CHG as you would any other liquid soap.  You can apply chg directly  to the skin and wash                       Gently with a scrungie or clean washcloth.  5.  Apply the CHG Soap to your body ONLY FROM THE NECK DOWN.   Do not use on face/ open                           Wound or open sores. Avoid contact with eyes, ears mouth and genitals (private parts).                       Wash face,  Genitals (private parts) with your normal soap.             6.  Wash thoroughly, paying special attention to the area where your surgery  will be performed.  7.  Thoroughly rinse your body with warm water from the neck down.  8.  DO NOT shower/wash with your normal soap after using and rinsing off  the CHG Soap.                9.  Pat yourself dry with a clean towel.            10.  Wear clean pajamas.            11.  Place clean sheets on your bed the night of your first shower and do not  sleep with pets. Day of Surgery : Do not apply any lotions/deodorants the morning of surgery.  Please wear clean clothes to the hospital/surgery center.  FAILURE TO FOLLOW THESE INSTRUCTIONS MAY  RESULT IN THE CANCELLATION OF YOUR SURGERY PATIENT SIGNATURE_________________________________  NURSE SIGNATURE__________________________________  ________________________________________________________________________

## 2019-09-14 ENCOUNTER — Encounter (HOSPITAL_COMMUNITY): Payer: Self-pay | Admitting: Anesthesiology

## 2019-09-14 ENCOUNTER — Other Ambulatory Visit: Payer: Self-pay | Admitting: Physician Assistant

## 2019-09-14 ENCOUNTER — Encounter (HOSPITAL_COMMUNITY): Payer: Self-pay | Admitting: Physician Assistant

## 2019-09-14 ENCOUNTER — Other Ambulatory Visit: Payer: Self-pay

## 2019-09-14 ENCOUNTER — Encounter (HOSPITAL_COMMUNITY)
Admission: RE | Admit: 2019-09-14 | Discharge: 2019-09-14 | Disposition: A | Discharge status: Home / Self Care | Payer: Medicare Other | Source: Ambulatory Visit | Attending: Orthopaedic Surgery | Admitting: Orthopaedic Surgery

## 2019-09-14 ENCOUNTER — Encounter (HOSPITAL_COMMUNITY): Payer: Self-pay

## 2019-09-14 DIAGNOSIS — I252 Old myocardial infarction: Secondary | ICD-10-CM | POA: Insufficient documentation

## 2019-09-14 DIAGNOSIS — I2510 Atherosclerotic heart disease of native coronary artery without angina pectoris: Secondary | ICD-10-CM | POA: Insufficient documentation

## 2019-09-14 DIAGNOSIS — Z01812 Encounter for preprocedural laboratory examination: Secondary | ICD-10-CM | POA: Insufficient documentation

## 2019-09-14 DIAGNOSIS — Z21 Asymptomatic human immunodeficiency virus [HIV] infection status: Secondary | ICD-10-CM | POA: Insufficient documentation

## 2019-09-14 DIAGNOSIS — I10 Essential (primary) hypertension: Secondary | ICD-10-CM | POA: Insufficient documentation

## 2019-09-14 DIAGNOSIS — Z79899 Other long term (current) drug therapy: Secondary | ICD-10-CM | POA: Insufficient documentation

## 2019-09-15 ENCOUNTER — Encounter (HOSPITAL_COMMUNITY)
Admission: RE | Admit: 2019-09-15 | Discharge: 2019-09-15 | Disposition: A | Discharge status: Home / Self Care | Payer: Medicare Other | Source: Ambulatory Visit | Attending: Orthopaedic Surgery | Admitting: Orthopaedic Surgery

## 2019-09-15 DIAGNOSIS — Z01812 Encounter for preprocedural laboratory examination: Secondary | ICD-10-CM | POA: Diagnosis not present

## 2019-09-15 DIAGNOSIS — I252 Old myocardial infarction: Secondary | ICD-10-CM | POA: Diagnosis not present

## 2019-09-15 DIAGNOSIS — I10 Essential (primary) hypertension: Secondary | ICD-10-CM | POA: Diagnosis not present

## 2019-09-15 DIAGNOSIS — Z79899 Other long term (current) drug therapy: Secondary | ICD-10-CM | POA: Diagnosis not present

## 2019-09-15 DIAGNOSIS — I2510 Atherosclerotic heart disease of native coronary artery without angina pectoris: Secondary | ICD-10-CM | POA: Diagnosis not present

## 2019-09-15 DIAGNOSIS — Z21 Asymptomatic human immunodeficiency virus [HIV] infection status: Secondary | ICD-10-CM | POA: Diagnosis not present

## 2019-09-15 LAB — BASIC METABOLIC PANEL
Anion gap: 11 (ref 5–15)
BUN: 15 mg/dL (ref 8–23)
CO2: 28 mmol/L (ref 22–32)
Calcium: 8.5 mg/dL — ABNORMAL LOW (ref 8.9–10.3)
Chloride: 99 mmol/L (ref 98–111)
Creatinine, Ser: 1.11 mg/dL (ref 0.61–1.24)
GFR calc Af Amer: >60 mL/min (ref >60)
GFR calc non Af Amer: >60 mL/min (ref >60)
Glucose, Bld: 99 mg/dL (ref 70–99)
Potassium: 3.6 mmol/L (ref 3.5–5.1)
Sodium: 138 mmol/L (ref 135–145)

## 2019-09-15 LAB — CBC
HCT: 41 % (ref 39.0–52.0)
Hemoglobin: 13 g/dL (ref 13.0–17.0)
MCH: 35.5 pg — ABNORMAL HIGH (ref 26.0–34.0)
MCHC: 31.7 g/dL (ref 30.0–36.0)
MCV: 112 fL — ABNORMAL HIGH (ref 80.0–100.0)
Platelets: 139 10*3/uL — ABNORMAL LOW (ref 150–400)
RBC: 3.66 MIL/uL — ABNORMAL LOW (ref 4.22–5.81)
RDW: 15 % (ref 11.5–15.5)
WBC: 13.3 10*3/uL — ABNORMAL HIGH (ref 4.0–10.5)
nRBC: 0 % (ref 0.0–0.2)

## 2019-09-15 LAB — SURGICAL PCR SCREEN
MRSA, PCR: NEGATIVE
Staphylococcus aureus: NEGATIVE

## 2019-09-15 LAB — HEMOGLOBIN A1C
Hgb A1c MFr Bld: 4.4 % — ABNORMAL LOW (ref 4.8–5.6)
Mean Plasma Glucose: 79.58 mg/dL

## 2019-09-15 LAB — ABO/RH: ABO/RH(D): A POS

## 2019-09-16 ENCOUNTER — Encounter: Payer: Self-pay | Admitting: Internal Medicine

## 2019-09-17 ENCOUNTER — Other Ambulatory Visit: Payer: Self-pay | Admitting: Internal Medicine

## 2019-09-19 ENCOUNTER — Other Ambulatory Visit: Payer: Self-pay

## 2019-09-19 ENCOUNTER — Telehealth: Payer: Self-pay

## 2019-09-19 DIAGNOSIS — H26493 Other secondary cataract, bilateral: Secondary | ICD-10-CM | POA: Diagnosis not present

## 2019-09-19 DIAGNOSIS — H40013 Open angle with borderline findings, low risk, bilateral: Secondary | ICD-10-CM | POA: Diagnosis not present

## 2019-09-19 DIAGNOSIS — B20 Human immunodeficiency virus [HIV] disease: Secondary | ICD-10-CM

## 2019-09-19 MED ORDER — BIKTARVY 50-200-25 MG PO TABS: 1.0000 | ORAL_TABLET | Freq: Every day | ORAL | 11 refills | Status: DC

## 2019-09-19 NOTE — Progress Notes (Signed)
Anesthesia Chart Review   Case: [id number] Date/Time: 09/23/19 1200   Procedure: REVISION LEFT HIP ACETABULAR COMPONENT (Left Hip)   Anesthesia type: Choice   Pre-op diagnosis: failed left hip acetabular component   [facility] 09 / [facility]   Surgeons: Mcarthur Rossetti, MD      DISCUSSION:69 y.o. never smoker with h/o HIV, RA, GERD, HTN, DVT, CAD (CABG 2010), CLL, failed left hip arthroplasty scheduled for above procedure 09/23/2019 with Dr. Jean Rosenthal.    Pt seen by PCP 09/12/2019.  Per OV note he was seen for 5 days of sore throat, productive cough, and shortness of breath.   On sulfamethoxazole/trimethoprim for pneumocystis prophylaxis at that visit.  Lefamulin started at this visit.  Clearance requested.     History of thoracolumbar fusion with hardware in place T11-sacrum.    Last seen by cardiologist, Dr. Percival Spanish, 07/21/19.  Per OV note, " He could be having angina with dyspnea as the equivalent.  However, he also has some chronic lung disease and has not seen pulmonary in a while.  Therefore, my first step will be to set him up to see his pulmonologist back and if there is no thought of a reversible pulmonary etiology I will consider perfusion imaging."  Cardiac clearance requested.    VS: BP 124/75   Pulse 75   Temp 36.7 C (Oral)   Resp 16   Ht _0  (1.753 m)   Wt 79.4 kg   SpO2 98%   BMI 25.84 kg/m   PROVIDERS: [name], MD is PCP  [name], MD is Cardiologist   Bobby Rumpf, MD w/ Infectious Disease LABS: Labs reviewed: Acceptable for surgery. (all labs ordered are listed, but only abnormal results are displayed)  Labs Reviewed  BASIC METABOLIC PANEL - Abnormal; Notable for the following components:      Result Value   Calcium 8.5 (*)    All other components within normal limits  HEMOGLOBIN A1C - Abnormal; Notable for the following components:   Hgb A1c MFr Bld 4.4 (*)    All other components within normal limits  CBC -  Abnormal; Notable for the following components:   WBC 13.3 (*)    RBC 3.66 (*)    MCV 112.0 (*)    MCH 35.5 (*)    Platelets 139 (*)    All other components within normal limits  SURGICAL PCR SCREEN  TYPE AND SCREEN  ABO/RH     IMAGES:   EKG: 06/06/2019 Rate 92 bpm  Sinus rhythm with premature atrial complexes Possible left atrial enlargement Left ventricular hypertrophy Cannot rule out Septal infarct, age undetermined   CV: Echo 11/30/2018 IMPRESSIONS    1. The left ventricle has normal systolic function with an ejection  fraction of 60-65%. The cavity size was normal. Left ventricular diastolic  Doppler parameters are consistent with impaired relaxation.  2. The right ventricle has normal systolic function. The cavity was  mildly enlarged. There is no increase in right ventricular wall thickness.  3. Right atrial size was mildly dilated.  4. The aortic valve is tricuspid. Mild thickening of the aortic valve.  Moderate calcification of the aortic valve. Aortic valve regurgitation is  mild by color flow Doppler. Mild stenosis of the aortic valve.  5. Peak aortic velocity: 2.37ms, mean gradient 938mg.  6. The aorta is normal in size and structure.   Myocardial Perfusion 08/16/2014 Overall Impression:  Intermediate risk stress nuclear study with moderate-size and intensity, fixed apical/septal  defect suggestive of scar. No reversible ischemia.  LV Wall Motion:  Septal hypokinesis/dyskineis and apical akinesis, EF 50%  No evidence of ischemia. There is a possible old scar. However, is echo was OK with no wall motion abnormalities. He did have some TR on echo but no mention of elevated pulmonary pressures were recorded. I would like to see him back to discuss this and the possibility of needing a sleep study.  Past Medical History:  Diagnosis Date  . Allergy   . Anxiety   . Carotid artery occlusion    40-60% right ICA stenosis (09/2008)  . Cataract   .  Chronic back pain   . CLL (chronic lymphoblastic leukemia) dx 2010   Followed at [facility] q[facility]mo no current therapy   . Clotting disorder ([facility]   . Coronary artery disease 2010   s/p CABG '10, sees Dr. HPercival Spanish . Depression   . Diverticulosis   . DVT, lower extremity, recurrent ([place] 2008, 2009   LLE, chronic anticoag since 2009  . Esophagitis   . Fibromyalgia   . Gallstones   . GERD (gastroesophageal reflux disease)   . Gout   . Gynecomastia, male   . H/O hiatal hernia 2008   surgery  . Hemorrhoids   . Hepatitis A yrs ago  . HIV infection ([facility] dx 1993  . Hypertension   . Impotence of organic origin   . [place]ocardial infarction ([place] 2010    x 2  . Neuromuscular disorder ([facility])    neuropathy  . Osteoarthritis, knee    s/p B TKA  . Osteoporosis   . Rheumatoid arthritis(714.0) dx 2010   MTX, follows with rheum  . Seasonal allergies   . Secondary syphilis 07/24/14 dx   s/p 2 wks doxy  . Status post dilation of esophageal narrowing   . TIA (transient ischemic attack) 1997   mild residual L mouth droop  . Tubular adenoma of colon     Past Surgical History:  Procedure Laterality Date  . ANTERIOR HIP REVISION Left 08/17/2018   Procedure: ANTERIOR LEFT HIP REVISION;  Surgeon: BMcarthur Rossetti MD;  [facility]  Service: Orthopedics;  Laterality: Left;  . ANTERIOR HIP REVISION Left 11/02/2018   Procedure: LEFT HIP CONSTRAINED LINER REVISION;  Surgeon: BMcarthur Rossetti MD;  [place] [facility]  Service: Orthopedics;  Laterality: Left;  . CHOLECYSTECTOMY    . COLONOSCOPY WITH PROPOFOL N/A 12/28/2012   Procedure: COLONOSCOPY WITH PROPOFOL;  Surgeon: JJerene Bears MD;  [facility];  Service: Gastroenterology;  Laterality: N/A;  . CORONARY ARTERY BYPASS GRAFT  2010   triple bypass  . ESOPHAGOGASTRODUODENOSCOPY (EGD) WITH PROPOFOL N/A 12/28/2012   Procedure: ESOPHAGOGASTRODUODENOSCOPY (EGD) WITH PROPOFOL;  Surgeon: JJerene Bears MD;  [facility];   Service: Gastroenterology;  Laterality: N/A;  . ESOPHAGOGASTRODUODENOSCOPY (EGD) WITH PROPOFOL N/A 03/15/2013   Procedure: ESOPHAGOGASTRODUODENOSCOPY (EGD) WITH PROPOFOL;  Surgeon: JJerene Bears MD;  [facility];  Service: Gastroenterology;  Laterality: N/A;  . ESOPHAGOGASTRODUODENOSCOPY (EGD) WITH PROPOFOL N/A 02/07/2016   Procedure: ESOPHAGOGASTRODUODENOSCOPY (EGD) WITH PROPOFOL;  Surgeon: JJerene Bears MD;  [facility];  Service: Gastroenterology;  Laterality: N/A;  . HARDWARE REMOVAL N/A 07/02/2012   Procedure: HARDWARE REMOVAL;  Surgeon: GElaina Hoops MD;  [facility];  Service: Neurosurgery;  Laterality: N/A;  . HIATAL HERNIA REPAIR     wrap  . HIP CLOSED REDUCTION Left 08/15/2018   Procedure: CLOSED REDUCTION HIP;  Surgeon: DNewt Minion  MD;  Location: [place];  Service: Orthopedics;  Laterality: Left;  . HIP CLOSED REDUCTION Left 08/28/2018   Procedure: CLOSED REDUCTION HIP FOR RECURRENT DISLOCATION AND DRESSING CHANGE;  Surgeon: Jessy Oto, MD;  Location: [place];  Service: Orthopedics;  Laterality: Left;  . HIP CLOSED REDUCTION Left 09/15/2018   Procedure: CLOSED REDUCTION HIP DISLOCATION;  Surgeon: Mcarthur Rossetti, MD;  Location: [place];  Service: Orthopedics;  Laterality: Left;  . HIP CLOSED REDUCTION Left 10/31/2018   Procedure: CLOSED REDUCTION LEFT TOTAL HIP;  Surgeon: Leandrew Koyanagi, MD;  Location: [place];  Service: Orthopedics;  Laterality: Left;  . INGUINAL HERNIA REPAIR Bilateral   . JOINT REPLACEMENT Left 1999  . KNEE ARTHROPLASTY  07/22/2011   Procedure: COMPUTER ASSISTED TOTAL KNEE ARTHROPLASTY;  Surgeon: Meredith Pel, MD;  Location: [place];  Service: Orthopedics;  Laterality: Right;  Right total knee arthroplasty  . MANDIBLE SURGERY Bilateral    tmj  . REPLACEMENT TOTAL KNEE Bilateral   . ring around testicle hernia reapir  184 and 1986   x 2  . ROTATOR CUFF REPAIR Right   . SHOULDER SURGERY Left   . SPINE SURGERY  2010   "rod and  screws", "failed", lopwer spine,   . stent to heart x 1  2010  . TONSILLECTOMY    . TOTAL HIP ARTHROPLASTY Left 08/13/2018   Procedure: LEFT TOTAL HIP ARTHROPLASTY ANTERIOR APPROACH;  Surgeon: Mcarthur Rossetti, MD;  Location: [place];  Service: Orthopedics;  Laterality: Left;  . TOTAL HIP ARTHROPLASTY Left 09/17/2018   Procedure: ANTERIOR LEFT HIP REVISION;  Surgeon: Mcarthur Rossetti, MD;  Location: [place];  Service: Orthopedics;  Laterality: Left;  . UMBILICAL HERNIA REPAIR     x 1  . varicose vein     stripping  . VIDEO BRONCHOSCOPY Bilateral 10/16/2015   Procedure: VIDEO BRONCHOSCOPY WITHOUT FLUORO;  Surgeon: Brand Males, MD;  [facility];  Service: Cardiopulmonary;  Laterality: Bilateral;    MEDICATIONS: . acetaminophen (TYLENOL) 500 MG tablet  . albuterol (PROVENTIL) (2.5 MG/3ML) 0.083% nebulizer solution  . atorvastatin (LIPITOR) 10 MG tablet  . bictegravir-emtricitabine-tenofovir AF (BIKTARVY) 50-200-25 MG TABS tablet  . clindamycin (CLEOCIN) 300 MG capsule  . darunavir-cobicistat (PREZCOBIX) 800-150 MG tablet  . EPINEPHrine (EPIPEN 2-PAK) 0.3 mg/0.3 mL IJ SOAJ injection  . escitalopram (LEXAPRO) 20 MG tablet  . famotidine (PEPCID) 40 MG tablet  . folic acid (FOLVITE) 1 MG tablet  . Glycopyrrolate (LONHALA MAGNAIR REFILL KIT) 25 MCG/ML SOLN  . l-methylfolate-B6-B12 (METANX) 3-35-2 MG TABS tablet  . leflunomide (ARAVA) 20 MG tablet  . metoprolol succinate (TOPROL-XL) 50 MG 24 hr tablet  . nitroGLYCERIN (NITROSTAT) 0.4 MG SL tablet  . ondansetron (ZOFRAN) 4 MG tablet  . predniSONE (DELTASONE) 5 MG tablet  . pregabalin (LYRICA) 200 MG capsule  . SAVAYSA 30 MG TABS tablet  . sulfamethoxazole-trimethoprim (BACTRIM DS) 800-160 MG tablet  . torsemide (DEMADEX) 20 MG tablet  . valACYclovir (VALTREX) 500 MG tablet   No current facility-administered medications for this encounter.     Maia Plan [facility] Testing [phone] 09/21/19   11:20 AM

## 2019-09-19 NOTE — Telephone Encounter (Signed)
pls advise if ok to refill../lmb 

## 2019-09-19 NOTE — Telephone Encounter (Signed)
Contacted pharmacy to clarify that patient is to be taking Biktarvy and Prezcobix NOT Dovato. New prescription for [facility] sent as it was previously deactivated. Pharmacy to notify patient.  [place] Decarolis Lorita Officer, RN

## 2019-09-19 NOTE — Telephone Encounter (Signed)
-----   Message from Orpah Melter, CPhT sent at 09/19/2019 10:22 AM EDT ----- Regarding: Called asking which medication he is to be taking [name] called stating his pharmacy said he is to switch medications.  He needs to know to which medication.  Please return call.  Thank you,  Venida Jarvis. Nadara Mustard [place] Patient [facility] for Infectious Disease Phone: [phone] Fax:  [phone]

## 2019-09-20 ENCOUNTER — Other Ambulatory Visit: Payer: Self-pay | Admitting: Internal Medicine

## 2019-09-20 ENCOUNTER — Other Ambulatory Visit (HOSPITAL_COMMUNITY)
Admission: RE | Admit: 2019-09-20 | Discharge: 2019-09-20 | Disposition: A | Discharge status: Home / Self Care | Payer: Medicare Other | Source: Ambulatory Visit | Attending: Orthopaedic Surgery | Admitting: Orthopaedic Surgery

## 2019-09-20 DIAGNOSIS — H9313 Tinnitus, bilateral: Secondary | ICD-10-CM | POA: Diagnosis not present

## 2019-09-20 DIAGNOSIS — H9113 Presbycusis, bilateral: Secondary | ICD-10-CM | POA: Diagnosis not present

## 2019-09-20 DIAGNOSIS — R2689 Other abnormalities of gait and mobility: Secondary | ICD-10-CM | POA: Diagnosis not present

## 2019-09-20 DIAGNOSIS — Z01812 Encounter for preprocedural laboratory examination: Secondary | ICD-10-CM | POA: Insufficient documentation

## 2019-09-20 DIAGNOSIS — Z20822 Contact with and (suspected) exposure to covid-19: Secondary | ICD-10-CM | POA: Insufficient documentation

## 2019-09-20 DIAGNOSIS — H903 Sensorineural hearing loss, bilateral: Secondary | ICD-10-CM | POA: Diagnosis not present

## 2019-09-20 LAB — SARS CORONAVIRUS 2 (TAT 6-24 HRS): SARS Coronavirus 2: NEGATIVE

## 2019-09-21 ENCOUNTER — Other Ambulatory Visit: Payer: Self-pay

## 2019-09-21 ENCOUNTER — Encounter: Payer: Self-pay | Admitting: Urology

## 2019-09-21 ENCOUNTER — Encounter (INDEPENDENT_AMBULATORY_CARE_PROVIDER_SITE_OTHER): Payer: Self-pay

## 2019-09-21 ENCOUNTER — Other Ambulatory Visit: Payer: Self-pay | Admitting: Urology

## 2019-09-21 ENCOUNTER — Ambulatory Visit (INDEPENDENT_AMBULATORY_CARE_PROVIDER_SITE_OTHER): Payer: Medicare Other | Admitting: Urology

## 2019-09-21 VITALS — BP 112/75 | HR 70 | Temp 97.5°F | Ht 69.0 in | Wt 170.0 lb

## 2019-09-21 DIAGNOSIS — I2510 Atherosclerotic heart disease of native coronary artery without angina pectoris: Secondary | ICD-10-CM

## 2019-09-21 DIAGNOSIS — R3912 Poor urinary stream: Secondary | ICD-10-CM | POA: Insufficient documentation

## 2019-09-21 DIAGNOSIS — N400 Enlarged prostate without lower urinary tract symptoms: Secondary | ICD-10-CM | POA: Diagnosis not present

## 2019-09-21 LAB — POCT URINALYSIS DIPSTICK
Bilirubin, UA: NEGATIVE
Blood, UA: NEGATIVE
Glucose, UA: NEGATIVE
Ketones, UA: NEGATIVE
Leukocytes, UA: NEGATIVE
Nitrite, UA: NEGATIVE
Protein, UA: NEGATIVE
Spec Grav, UA: <=1.005 — AB (ref 1.010–1.025)
Urobilinogen, UA: NEGATIVE E.U./dL — AB
pH, UA: 6 (ref 5.0–8.0)

## 2019-09-21 LAB — PSA: PSA: 1.2 ng/mL (ref <=4.0)

## 2019-09-21 LAB — BLADDER SCAN AMB NON-IMAGING: Scan Result: 6.6

## 2019-09-21 MED ORDER — TAMSULOSIN HCL 0.4 MG PO CAPS: 0.4000 mg | ORAL_CAPSULE | Freq: Every day | ORAL | 1 refills | Status: DC

## 2019-09-21 MED ORDER — CLOTRIMAZOLE-BETAMETHASONE 1-0.05 % EX CREA: 1.0000 "application " | TOPICAL_CREAM | Freq: Two times a day (BID) | CUTANEOUS | 1 refills | Status: DC

## 2019-09-21 NOTE — Progress Notes (Signed)
09/21/2019 10:20 AM   [name] 1951/04/16 [id number]  Referring provider: [name], MD [place],  [place] [address]  [name]  HPI: [name] is a 69yo here for evaluation or a weak urinary stream. For over 1 year he has noted a weaker stream. Nocturia 1-2x. Urinary urge, rare urge incontinence. NO dribbling, dysuria, hematuria. Remote hx of nephrolithiasis in 2004-05. NO previus BPH therapy.    PMH: Past Medical History:  Diagnosis Date  . Allergy   . Anxiety   . Carotid artery occlusion    40-60% right ICA stenosis (09/2008)  . Cataract   . Chronic back pain   . CLL (chronic lymphoblastic leukemia) dx 2010   Followed at [facility] q59mo no current therapy   . Clotting disorder ([facility]   . Coronary artery disease 2010   s/p CABG '10, sees Dr. HPercival Spanish . Depression   . Diverticulosis   . DVT, lower extremity, recurrent ([facility] 2008, 2009   LLE, chronic anticoag since 2009  . Esophagitis   . Fibromyalgia   . Gallstones   . GERD (gastroesophageal reflux disease)   . Gout   . Gynecomastia, male   . H/O hiatal hernia 2008   surgery  . Hemorrhoids   . Hepatitis A yrs ago  . HIV infection ([facility] dx 1993  . Hypertension   . Impotence of organic origin   . Myocardial infarction ([facility] 2010    x 2  . Neuromuscular disorder (HCC)    neuropathy  . Osteoarthritis, knee    s/p B TKA  . Osteoporosis   . Rheumatoid arthritis(714.0) dx 2010   MTX, follows with rheum  . Seasonal allergies   . Secondary syphilis 07/24/14 dx   s/p 2 wks doxy  . Status post dilation of esophageal narrowing   . TIA (transient ischemic attack) 1997   mild residual L mouth droop  . Tubular adenoma of colon     Surgical History: Past Surgical History:  Procedure Laterality Date  . ANTERIOR HIP REVISION Left 08/17/2018   Procedure: ANTERIOR LEFT HIP REVISION;  Surgeon: BMcarthur Rossetti MD;  [facility] [facility]  Service: Orthopedics;  Laterality: Left;  . ANTERIOR HIP  REVISION Left 11/02/2018   Procedure: LEFT HIP CONSTRAINED LINER REVISION;  Surgeon: BMcarthur Rossetti MD;  [facility]  Service: Orthopedics;  Laterality: Left;  . CHOLECYSTECTOMY    . COLONOSCOPY WITH PROPOFOL N/A 12/28/2012   Procedure: COLONOSCOPY WITH PROPOFOL;  Surgeon: JJerene Bears MD;  [facility];  Service: Gastroenterology;  Laterality: N/A;  . CORONARY ARTERY BYPASS GRAFT  2010   triple bypass  . ESOPHAGOGASTRODUODENOSCOPY (EGD) WITH PROPOFOL N/A 12/28/2012   Procedure: ESOPHAGOGASTRODUODENOSCOPY (EGD) WITH PROPOFOL;  Surgeon: JJerene Bears MD;  [facility];  Service: Gastroenterology;  Laterality: N/A;  . ESOPHAGOGASTRODUODENOSCOPY (EGD) WITH PROPOFOL N/A 03/15/2013   Procedure: ESOPHAGOGASTRODUODENOSCOPY (EGD) WITH PROPOFOL;  Surgeon: JJerene Bears MD;  [facility];  Service: Gastroenterology;  Laterality: N/A;  . ESOPHAGOGASTRODUODENOSCOPY (EGD) WITH PROPOFOL N/A 02/07/2016   Procedure: ESOPHAGOGASTRODUODENOSCOPY (EGD) WITH PROPOFOL;  Surgeon: JJerene Bears MD;  [facility];  Service: Gastroenterology;  Laterality: N/A;  . HARDWARE REMOVAL N/A 07/02/2012   Procedure: HARDWARE REMOVAL;  Surgeon: GElaina Hoops MD;  [facility];  Service: Neurosurgery;  Laterality: N/A;  . HIATAL HERNIA REPAIR     wrap  . HIP CLOSED REDUCTION Left 08/15/2018   Procedure: CLOSED REDUCTION HIP;  Surgeon: Newt Minion, MD;  Location: [place];  Service: Orthopedics;  Laterality: Left;  . HIP CLOSED REDUCTION Left 08/28/2018   Procedure: CLOSED REDUCTION HIP FOR RECURRENT DISLOCATION AND DRESSING CHANGE;  Surgeon: Jessy Oto, MD;  Location: [place];  Service: Orthopedics;  Laterality: Left;  . HIP CLOSED REDUCTION Left 09/15/2018   Procedure: CLOSED REDUCTION HIP DISLOCATION;  Surgeon: Mcarthur Rossetti, MD;  Location: [place];  Service: Orthopedics;  Laterality: Left;  . HIP CLOSED REDUCTION Left 10/31/2018   Procedure: CLOSED REDUCTION LEFT  TOTAL HIP;  Surgeon: Leandrew Koyanagi, MD;  Location: [facility];  Service: Orthopedics;  Laterality: Left;  . INGUINAL HERNIA REPAIR Bilateral   . JOINT REPLACEMENT Left 1999  . KNEE ARTHROPLASTY  07/22/2011   Procedure: COMPUTER ASSISTED TOTAL KNEE ARTHROPLASTY;  Surgeon: Meredith Pel, MD;  Location: [facility];  Service: Orthopedics;  Laterality: Right;  Right total knee arthroplasty  . MANDIBLE SURGERY Bilateral    tmj  . REPLACEMENT TOTAL KNEE Bilateral   . ring around testicle hernia reapir  184 and 1986   x 2  . ROTATOR CUFF REPAIR Right   . SHOULDER SURGERY Left   . SPINE SURGERY  2010   "rod and screws", "failed", lopwer spine,   . stent to heart x 1  2010  . TONSILLECTOMY    . TOTAL HIP ARTHROPLASTY Left 08/13/2018   Procedure: LEFT TOTAL HIP ARTHROPLASTY ANTERIOR APPROACH;  Surgeon: Mcarthur Rossetti, MD;  Location: [facility];  Service: Orthopedics;  Laterality: Left;  . TOTAL HIP ARTHROPLASTY Left 09/17/2018   Procedure: ANTERIOR LEFT HIP REVISION;  Surgeon: Mcarthur Rossetti, MD;  Location: [facility];  Service: Orthopedics;  Laterality: Left;  . UMBILICAL HERNIA REPAIR     x 1  . varicose vein     stripping  . VIDEO BRONCHOSCOPY Bilateral 10/16/2015   Procedure: VIDEO BRONCHOSCOPY WITHOUT FLUORO;  Surgeon: Brand Males, MD;  [facility];  Service: Cardiopulmonary;  Laterality: Bilateral;    Home Medications:  Allergies as of 09/21/2019      Reactions   Golimumab Anaphylaxis   Simponi ARIA   Orencia [abatacept] Anaphylaxis   Other Anaphylaxis, Hives   Pecans   Peanut-containing Drug Products Anaphylaxis, Hives, Swelling   Swelling of throat   Morphine Other (See Comments)   Severe headache- Can tolerate Dilaudid, however   Oxycodone-acetaminophen Other (See Comments)   Headache 6.22.2020 patient is currently taking and tolerating   Penicillins Rash, Other (See Comments)   FLUSHED & RED, also Has patient had a PCN reaction causing immediate rash,  facial/tongue/throat swelling, SOB or lightheadedness with hypotension: Yes Has patient had a PCN reaction causing severe rash involving mucus membranes or skin necrosis: No Has patient had a PCN reaction that required hospitalization: No Has patient had a PCN reaction occurring within the last 10 years: No If all of the above answers are "NO", then may proceed with Cephalosporin use   Promethazine Hcl Other (See Comments)   Makes him feel "drunk" at higher strengths      Medication List       Accurate as of Sep 21, 2019 10:20 AM. If you have any questions, ask your nurse or doctor.        acetaminophen 500 MG tablet Commonly known as: TYLENOL Take 500-1,000 mg by mouth every 6 (six) hours as needed for mild pain or moderate pain.   albuterol (2.5 MG/3ML) 0.083% nebulizer solution Commonly known as: PROVENTIL Take 3  mLs (2.5 mg total) by nebulization every 6 (six) hours as needed for wheezing or shortness of breath.   atorvastatin 10 MG tablet Commonly known as: LIPITOR TAKE 1 TABLET BY MOUTH AT BEDTIME   Biktarvy 50-200-25 MG Tabs tablet Generic drug: bictegravir-emtricitabine-tenofovir AF TAKE 1 TABLET BY MOUTH EVERY DAY   Biktarvy 50-200-25 MG Tabs tablet Generic drug: bictegravir-emtricitabine-tenofovir AF Take 1 tablet by mouth daily.   clindamycin 300 MG capsule Commonly known as: Cleocin Take two by mouth one hour before dental appointment, then two by mouth six hours after appointment   doxycycline 100 MG tablet Commonly known as: VIBRA-TABS TAKE 1 TABLET BY MOUTH TWICE A DAY   EpiPen 2-Pak 0.3 mg/0.3 mL Soaj injection Generic drug: EPINEPHrine Inject 0.3 mg into the muscle as needed for anaphylaxis.   escitalopram 20 MG tablet Commonly known as: LEXAPRO TAKE 1 TABLET BY MOUTH EVERY DAY   Eysuvis 0.25 % Susp Generic drug: Loteprednol Etabonate   famotidine 40 MG tablet Commonly known as: PEPCID TAKE 1 TABLET BY MOUTH TWICE A DAY   folic acid 1 MG  tablet Commonly known as: FOLVITE Take 1 mg by mouth daily.   furosemide 20 MG tablet Commonly known as: LASIX   l-methylfolate-B6-B12 3-35-2 MG Tabs tablet Commonly known as: METANX Take 1 tablet by mouth 2 (two) times daily.   leflunomide 20 MG tablet Commonly known as: ARAVA Take 20 mg by mouth daily.   Lonhala Magnair Refill Kit 25 MCG/ML Soln Generic drug: Glycopyrrolate Inhale 1 Act into the lungs in the morning and at bedtime. What changed:   when to take this  reasons to take this   metoprolol succinate 50 MG 24 hr tablet Commonly known as: TOPROL-XL Take 1 tablet (50 mg total) by mouth daily.   nitroGLYCERIN 0.4 MG SL tablet Commonly known as: NITROSTAT Place 1 tablet (0.4 mg total) under the tongue every 5 (five) minutes as needed for chest pain.   ondansetron 4 MG tablet Commonly known as: ZOFRAN Take 4 mg by mouth every 6 (six) hours as needed for nausea.   pantoprazole 40 MG tablet Commonly known as: PROTONIX Take by mouth.   predniSONE 5 MG tablet Commonly known as: DELTASONE Take 5 mg by mouth 2 (two) times daily.   pregabalin 200 MG capsule Commonly known as: LYRICA TAKE 1 CAPSULE BY MOUTH TWICE A DAY   Prezcobix 800-150 MG tablet Generic drug: darunavir-cobicistat Take 1 tablet by mouth daily with breakfast. Swallow whole. Do NOT crush, break or chew tablets. Take with food.   Prezcobix 800-150 MG tablet Generic drug: darunavir-cobicistat PLEASE SEE ATTACHED FOR DETAILED DIRECTIONS   Savaysa 30 MG Tabs tablet Generic drug: edoxaban TAKE 1 TABLET (30 MG TOTAL) BY MOUTH DAILY.   sulfamethoxazole-trimethoprim 800-160 MG tablet Commonly known as: BACTRIM DS Take 1 tablet by mouth every 12 (twelve) hours.   sulfaSALAzine 500 MG tablet Commonly known as: AZULFIDINE TAKE 1 TABLET BY MOUTH TWICE A DAY   torsemide 20 MG tablet Commonly known as: DEMADEX TAKE 1 TABLET BY MOUTH EVERY DAY   valACYclovir 500 MG tablet Commonly known as:  VALTREX TAKE 1 TABLET BY MOUTH EVERY DAY       Allergies:  Allergies  Allergen Reactions  . Golimumab Anaphylaxis    Simponi ARIA  . Orencia [Abatacept] Anaphylaxis  . Other Anaphylaxis and Hives    Pecans  . Peanut-Containing Drug Products Anaphylaxis, Hives and Swelling    Swelling of throat  . Morphine Other (See Comments)  Severe headache- Can tolerate Dilaudid, however  . Oxycodone-Acetaminophen Other (See Comments)    Headache 6.22.2020 patient is currently taking and tolerating  . Penicillins Rash and Other (See Comments)    FLUSHED & RED, also Has patient had a PCN reaction causing immediate rash, facial/tongue/throat swelling, SOB or lightheadedness with hypotension: Yes Has patient had a PCN reaction causing severe rash involving mucus membranes or skin necrosis: No Has patient had a PCN reaction that required hospitalization: No Has patient had a PCN reaction occurring within the last 10 years: No If all of the above answers are "NO", then may proceed with Cephalosporin use  . Promethazine Hcl Other (See Comments)    Makes him feel "drunk" at higher strengths    Family History: Family History  Problem Relation Age of Onset  . Breast cancer Mother   . Hypertension Mother   . Hyperlipidemia Mother   . Diabetes Mother   . Prostate cancer Father   . Colon polyps Father   . Hyperlipidemia Father   . Crohn's disease Paternal Aunt   . Diabetes Maternal Grandmother   . Diabetes Brother        x 3  . Heart attack Brother   . Heart disease Brother        x 3  . Heart attack Brother   . Hyperlipidemia Brother        x 3  . Alcohol abuse Daughter   . Drug abuse Daughter   . Asthma Brother   . CVA Brother   . Colon cancer Neg Hx   . Esophageal cancer Neg Hx   . Rectal cancer Neg Hx   . Stomach cancer Neg Hx     Social History:  reports that he has never smoked. He has never used smokeless tobacco. He reports current alcohol use. He reports that he does  not use drugs.  ROS: All other review of systems were reviewed and are negative except what is noted above in HPI  Physical Exam: BP 112/75   Pulse 70   Temp (!) 97.5 F (36.4 C)   Ht '5\' 9"'$  (1.753 m)   Wt 170 lb (77.1 kg)   BMI 25.10 kg/m   Constitutional:  Alert and oriented, No acute distress. HEENT: [place] AT, moist mucus membranes.  Trachea midline, no masses. Cardiovascular: No clubbing, cyanosis, or edema. Respiratory: Normal respiratory effort, no increased work of breathing. GI: Abdomen is soft, nontender, nondistended, no abdominal masses GU: No CVA tenderness. Circumcised phallus. No masses/lesions on penis, testis, scrotum. Candida in inguinal fold Prostate 40g smooth no nodules no induration.  Lymph: No cervical or inguinal lymphadenopathy. Skin: No rashes, bruises or suspicious lesions. Neurologic: Grossly intact, no focal deficits, moving all 4 extremities. Psychiatric: Normal mood and affect.  Laboratory Data: Lab Results  Component Value Date   WBC 13.3 (H) 09/15/2019   HGB 13.0 09/15/2019   HCT 41.0 09/15/2019   MCV 112.0 (H) 09/15/2019   PLT 139 (L) 09/15/2019    Lab Results  Component Value Date   CREATININE 1.11 09/15/2019    No results found for: PSA  Lab Results  Component Value Date   TESTOSTERONE 336 07/22/2017    Lab Results  Component Value Date   HGBA1C 4.4 (L) 09/15/2019    Urinalysis    Component Value Date/Time   COLORURINE YELLOW 11/16/2018 1526   APPEARANCEUR CLEAR 11/16/2018 1526   LABSPEC >=1.030 (A) 11/16/2018 1526   PHURINE 6.0 11/16/2018 1526   GLUCOSEU NEGATIVE  11/16/2018 [place] 11/16/2018 1526   BILIRUBINUR neg 09/21/2019 [place] 11/16/2018 1526   PROTEINUR Negative 09/21/2019 [place] 09/26/2018 2011   UROBILINOGEN negative (A) 09/21/2019 1006   UROBILINOGEN 1.0 11/16/2018 1526   NITRITE neg 09/21/2019 1006   NITRITE NEGATIVE 11/16/2018 1526   LEUKOCYTESUR  Negative 09/21/2019 [place] 11/16/2018 1526    Lab Results  Component Value Date   MUCUS Presence of (A) 11/16/2018   BACTERIA RARE (A) 05/13/2016    Pertinent Imaging:  No results found for this or any previous visit. No results found for this or any previous visit. No results found for this or any previous visit. No results found for this or any previous visit. No results found for this or any previous visit. No results found for this or any previous visit. No results found for this or any previous visit. No results found for this or any previous visit.  Assessment & Plan:    1. Benign prostatic hyperplasia, unspecified whether lower urinary tract symptoms present -flomax 0.'4mg'$  daily - POCT urinalysis dipstick - Bladder Scan (Post Void Residual) in office  2. Weak urinary stream -start flomax 0.'4mg'$  daily   No follow-ups on file.  Nicolette Bang, MD  [facility] [place]

## 2019-09-21 NOTE — Progress Notes (Signed)
Urological Symptom Review  Patient is experiencing the following symptoms: Frequent urination Hard to postpone urination Get up at night to urinate Trouble starting stream Weak stream Erection problems Kidney stones  Review of Systems  Gastrointestinal (upper)  : Negative for upper GI symptoms  Gastrointestinal (lower) : Diarrhea Constipation  Constitutional : Fatigue  Skin: Negative for skin symptoms  Eyes: Negative for eye symptoms  Ear/Nose/Throat : Negative for Ear/Nose/Throat symptoms  Hematologic/Lymphatic: Negative for Hematologic/Lymphatic symptoms  Cardiovascular : Negative for cardiovascular symptoms  Respiratory : Shortness of breath  Endocrine: Negative for endocrine symptoms  Musculoskeletal: Joint pain  Neurological: Negative for neurological symptoms  Psychologic: Depression

## 2019-09-21 NOTE — Patient Instructions (Signed)

## 2019-09-22 ENCOUNTER — Telehealth: Payer: Self-pay | Admitting: *Deleted

## 2019-09-22 ENCOUNTER — Other Ambulatory Visit: Payer: Self-pay | Admitting: *Deleted

## 2019-09-22 DIAGNOSIS — R0600 Dyspnea, unspecified: Secondary | ICD-10-CM

## 2019-09-22 DIAGNOSIS — R0609 Other forms of dyspnea: Secondary | ICD-10-CM

## 2019-09-22 NOTE — Telephone Encounter (Signed)
   [facility] Pre-operative Risk Assessment    Request for surgical clearance:  1. What type of surgery is being performed? LEFT TOTAL HIP REVISION   2. When is this surgery scheduled? 09/23/19   3. What type of clearance is required (medical clearance vs. Pharmacy clearance to hold med vs. Both)? MEDICAL  4. Are there any medications that need to be held prior to surgery and how long? NONE LISTED   5. Practice name and name of physician performing surgery? [facility]; DR. Jean Rosenthal   6. What is your office phone number [phone]    7.   What is your office fax number [phone] ATTN: SHERRI  8.   Anesthesia type (None, local, MAC, general) ? GENERAL vs SPINAL   [name] 09/22/2019, 10:26 AM  _________________________________________________________________   (provider comments below)

## 2019-09-22 NOTE — Telephone Encounter (Signed)
It is difficult to clear him if he has increased symptoms of dyspnea and has not been seen in the office setting in a while.  I would suggest starting with

## 2019-09-22 NOTE — Telephone Encounter (Signed)
After reviewing the pt's chart I d/w pre op provider pt has not seen Pulmonary since 2017. I explained to the pre op team that this require a new referral. I have placed a referral today under Dr. Percival Spanish who is aware referral is placed under his name. Per Dr. Percival Spanish pt is needing to see Pulmonary before he has his appt with Dr. Percival Spanish.

## 2019-09-22 NOTE — Telephone Encounter (Signed)
I would suggest that he start with a pulmonary appt.

## 2019-09-23 ENCOUNTER — Inpatient Hospital Stay (HOSPITAL_COMMUNITY)
Admission: RE | Admit: 2019-09-23 | Payer: Medicare Other | Source: Other Acute Inpatient Hospital | Admitting: Orthopaedic Surgery

## 2019-09-23 ENCOUNTER — Telehealth: Payer: Self-pay | Admitting: *Deleted

## 2019-09-23 ENCOUNTER — Encounter (HOSPITAL_COMMUNITY): Admission: RE | Payer: Self-pay | Source: Other Acute Inpatient Hospital

## 2019-09-23 LAB — TYPE AND SCREEN
ABO/RH(D): A POS
Antibody Screen: NEGATIVE

## 2019-09-23 SURGERY — REVISION, TOTAL ARTHROPLASTY, HIP, ANTERIOR APPROACH
Anesthesia: Choice | Site: Hip | Laterality: Left

## 2019-09-23 NOTE — Telephone Encounter (Signed)
I have relayed this information to them. They said they would discuss and call back for an appointment with you if they need to. Also, he has been scheduled with pulmonology on 10/13/19. They are going to try and get him in earlier hopefully. Thanks.

## 2019-09-23 NOTE — Telephone Encounter (Signed)
According to [name], he was canceled due to the concern that his x-ray of his chest showed possible pneumonia.  Anesthesia wanted him cleared from that standpoint.  He needs to just stay off of that hip completely and remain nonweightbearing just in his scooter until we can get him cleared for surgery.  Obviously if the pain is bad enough and they can wait to be seen on Monday in the office for new x-rays we can certainly have new x-rays on Monday.

## 2019-09-23 NOTE — Telephone Encounter (Signed)
[name] daughter called me this morning. She states he is in excruciating pain and is unable to walk at all and get around his home. She wanted to know if there is anything that can be done or any recommendations until he can get to surgery. She states, "I don't know if it dislocated again".  I really wasn't sure either as to why surgery was canceled since I'm not following for the bundle now. They weren't told what they had to do to get "Clearance".

## 2019-09-27 NOTE — Telephone Encounter (Signed)
Pt is scheduled to see Pulmonary 10/13/2019. Will route to requesting surgeion's office to make them aware of the status of preop clearance.

## 2019-09-28 NOTE — Telephone Encounter (Signed)
Per Dr. Percival Spanish,  Advised patient to see pulmonology first. If no pulmonary causes of dyspnea found, we will proceed with nuclear stress testing for cardiac clearance. Pt appt with pulmonology is on 6/3.  Pt will see Dr. Percival Spanish on 10/27/19.   Dr. Percival Spanish to address preop clearance during that visit. I will remove from preop pool.

## 2019-09-29 ENCOUNTER — Encounter: Payer: Self-pay | Admitting: Orthopaedic Surgery

## 2019-09-29 NOTE — Telephone Encounter (Signed)
Notes were added earlier today.

## 2019-09-29 NOTE — Progress Notes (Signed)
Letter mailed

## 2019-09-29 NOTE — Telephone Encounter (Signed)
I will forward clearance notes to MD for appt 10/27/19. I will send FYI to the requesting office pt has upcoming appt with Pulmonary as well as Cardiologist Dr. Percival Spanish. I will remove from the pre op call back pool.

## 2019-09-30 ENCOUNTER — Telehealth: Payer: Self-pay | Admitting: Internal Medicine

## 2019-09-30 ENCOUNTER — Encounter: Payer: Self-pay | Admitting: Internal Medicine

## 2019-09-30 NOTE — Telephone Encounter (Signed)
That day is ILD clinic and pt has never been seen here before. Dr. Chase Caller can we use this slot?

## 2019-10-03 NOTE — Telephone Encounter (Signed)
I see he is on slot for 10/13/19. If he cannot wait then add him into earlier slot including BRL clinic if there is a slot available. [place] address is [place] [address]

## 2019-10-03 NOTE — Telephone Encounter (Signed)
Nothing in [place] sooner  Pt ok with waiting until 10/13/19  Nothing further needed

## 2019-10-04 ENCOUNTER — Encounter: Payer: Self-pay | Admitting: Internal Medicine

## 2019-10-04 NOTE — Addendum Note (Signed)
Addended by: Karle Barr on: 10/04/2019 02:19 PM   Modules accepted: Orders

## 2019-10-06 ENCOUNTER — Ambulatory Visit (INDEPENDENT_AMBULATORY_CARE_PROVIDER_SITE_OTHER): Payer: Medicare Other

## 2019-10-06 ENCOUNTER — Ambulatory Visit (INDEPENDENT_AMBULATORY_CARE_PROVIDER_SITE_OTHER): Payer: Medicare Other | Admitting: Orthopaedic Surgery

## 2019-10-06 ENCOUNTER — Encounter: Payer: Self-pay | Admitting: Orthopaedic Surgery

## 2019-10-06 ENCOUNTER — Other Ambulatory Visit: Payer: Self-pay

## 2019-10-06 ENCOUNTER — Ambulatory Visit: Payer: Medicare Other | Admitting: Cardiology

## 2019-10-06 DIAGNOSIS — Z96642 Presence of left artificial hip joint: Secondary | ICD-10-CM

## 2019-10-06 DIAGNOSIS — I2510 Atherosclerotic heart disease of native coronary artery without angina pectoris: Secondary | ICD-10-CM

## 2019-10-06 DIAGNOSIS — T84011D Broken internal left hip prosthesis, subsequent encounter: Secondary | ICD-10-CM | POA: Diagnosis not present

## 2019-10-06 NOTE — Progress Notes (Signed)
The patient is here today understanding that we still need to set him up for a major revision of his left hip acetabular component.  I wanted to get new x-rays today to make sure nothing had worsened significantly.  He is mainly getting around the scooter and has been unable to put weight on his hip due to pain.  Surgery has been canceled because we are waiting cardiac and pulmonology clearance for surgery.  He meets with both the specialties next week.  X-rays of the pelvis and left hip today obtained and compared to previous films show that the hip is located but the acetabular component is certainly recessed within the pelvis deeper and is not in an ideal position.  I am concerned about her continuing to fail.  I went over his x-rays with him in detail and explained what surgery is involving.  He understands that we will need to get the acetabular component out and placed supplemental bone graft and a dual mobility acetabular component.  I explained what this means as well.  On exam his hip is located but very painful.  Is soon as we have clearance for surgery we will work on getting him scheduled.  In the interim he will still limit his mobility with that hip and limit weightbearing.  All questions and concerns were answered and addressed.

## 2019-10-09 ENCOUNTER — Other Ambulatory Visit: Payer: Self-pay | Admitting: Internal Medicine

## 2019-10-09 DIAGNOSIS — F418 Other specified anxiety disorders: Secondary | ICD-10-CM

## 2019-10-10 ENCOUNTER — Other Ambulatory Visit: Payer: Self-pay | Admitting: Internal Medicine

## 2019-10-10 DIAGNOSIS — B59 Pneumocystosis: Secondary | ICD-10-CM

## 2019-10-13 ENCOUNTER — Other Ambulatory Visit: Payer: Self-pay | Admitting: Urology

## 2019-10-13 ENCOUNTER — Other Ambulatory Visit: Payer: Self-pay

## 2019-10-13 ENCOUNTER — Ambulatory Visit (INDEPENDENT_AMBULATORY_CARE_PROVIDER_SITE_OTHER): Payer: Medicare Other | Admitting: Internal Medicine

## 2019-10-13 ENCOUNTER — Encounter: Payer: Self-pay | Admitting: Internal Medicine

## 2019-10-13 VITALS — BP 110/60 | HR 87 | Temp 98.2°F | Ht 69.0 in | Wt 174.6 lb

## 2019-10-13 DIAGNOSIS — J849 Interstitial pulmonary disease, unspecified: Secondary | ICD-10-CM | POA: Diagnosis not present

## 2019-10-13 DIAGNOSIS — B351 Tinea unguium: Secondary | ICD-10-CM | POA: Diagnosis not present

## 2019-10-13 DIAGNOSIS — Z01811 Encounter for preprocedural respiratory examination: Secondary | ICD-10-CM

## 2019-10-13 DIAGNOSIS — M79672 Pain in left foot: Secondary | ICD-10-CM | POA: Diagnosis not present

## 2019-10-13 DIAGNOSIS — L84 Corns and callosities: Secondary | ICD-10-CM | POA: Diagnosis not present

## 2019-10-13 DIAGNOSIS — I2510 Atherosclerotic heart disease of native coronary artery without angina pectoris: Secondary | ICD-10-CM | POA: Diagnosis not present

## 2019-10-13 DIAGNOSIS — G609 Hereditary and idiopathic neuropathy, unspecified: Secondary | ICD-10-CM | POA: Diagnosis not present

## 2019-10-13 NOTE — Addendum Note (Signed)
Addended by: Vanessa Barbara on: 10/13/2019 11:05 AM   Modules accepted: Orders

## 2019-10-13 NOTE — Patient Instructions (Signed)
ICD-10-CM   1. Preop respiratory exam  Z01.811   2. ILD (interstitial lung disease) (HCC)  J84.9    Preop respiratory exam  -Overall complications such as prolonged mechanical ventilation or ventilator dependence after hip surgery is low moderate -Likewise risk for any pulmonary complications such as pneumonia or heart failure or oxygen requirement is moderate -Risk can be significantly lowered if duration of surgery is less than 2 hours  -Overall I do not see any barriers for surgery  Plan -Recommend surgery in the hospital setting with routine postoperative care by orthopedic surgery that involves early mobilization and aggressive incentive spirometry and measures to prevent blood clots in the leg and lung  ILD (interstitial lung disease) ([place])  -Prior history this seems to have progressed but we need to stage this to get a better accurate picture because therapies in this area changing  Plan -Do high-resolution CT chest supine and prone in the next few to several days -Do overnight oxygen study in the next few to several days -Do spirometry and DLCO breathing test sometime in August 2021  Follow-up -15-minute telephone visit with nurse practitioner in the next few to several days to review CT scan of the chest and overnight oxygen study -especially with a view towards your hip surgery  -30-minute face-to-face ILD clinic follow-up with Dr. Chase Caller in August 2021

## 2019-10-13 NOTE — Progress Notes (Addendum)
Cardiology Office Note   Date:  10/14/2019   ID:  [name], DOB July 06, 1950, MRN [id number]  PCP:  [name], MD  Cardiologist:  [name], MD EP: None  Chief Complaint  Patient presents with  . Pre-op Exam      History of Present Illness: [name] is a 69 y.o. male with PMH of CAD s/p BMS to LAD in 2010 with subsequent CABG in 2010, HTN, HLD, PSVT, CLL, DVT, TIA, HIV, RA, and ILD, who presents for preoperative assessment.  He was last evaluated by cardiology via a video visit with Dr. Percival Spanish 07/21/19, at which time he had infrequent palpitations, likely 2/2 PSVT seen on recent cardiac monitor, though no pre-syncope or syncope. Additionally his biggest limiting factor was DOE, felt to be more pulmonary in etiology given underlying ILD, for which he was referred to pulmonology. He was seen at by pulmonology 10/13/19 for preop respiratory examination and deemed low moderate risk and he was recommended for surgery in a hospital setting. Additionally it was felt that the patient had progression of his ILD and was recommended for a high-resolution CT chest, overnight oxygen study, and spirometry and DLCO breathing tests.   His last ischemic evaluation was a NST in 2016 which showed moderate fixed apical/septal defect suggestive of scar, with no reversible ischemia. His last echocardiogram 11/2018 showed EF 60-65%, G1DD, mild RAE, and mild AI/AS.  He presents today for preoperative assessment. He has chronic DOE which he reports is unchanged in recent months. More so, he is limited by hip pain and weakness in his legs. This will be his 5th hip surgery in the past yeah. He had no complications with his prior procedures. He can mop and vacuum his house without chest pain, though is limited in walking any significant distance. No complaints of dizziness, lightheadedness, syncope, prolonged palpitations, LE edema, orthopnea, or PND.     Past Medical History:  Diagnosis Date    . Allergy   . Anxiety   . Carotid artery occlusion    40-60% right ICA stenosis (09/2008)  . Cataract   . Chronic back pain   . CLL (chronic lymphoblastic leukemia) dx 2010   Followed at [facility] q59mo, no current therapy   . Clotting disorder ([place])   . Coronary artery disease 2010   s/p CABG '10, sees Dr. Percival Spanish  . Depression   . Diverticulosis   . DVT, lower extremity, recurrent ([place]) 2008, 2009   LLE, chronic anticoag since 2009  . Esophagitis   . Fibromyalgia   . Gallstones   . GERD (gastroesophageal reflux disease)   . Gout   . Gynecomastia, male   . H/O hiatal hernia 2008   surgery  . Hemorrhoids   . Hepatitis A yrs ago  . HIV infection ([place]) dx 1993  . Hypertension   . Impotence of organic origin   . Myocardial infarction ([place]) 2010    x 2  . Neuromuscular disorder ([facility])    neuropathy  . Osteoarthritis, knee    s/p B TKA  . Osteoporosis   . Rheumatoid arthritis(714.0) dx 2010   MTX, follows with rheum  . Seasonal allergies   . Secondary syphilis 07/24/14 dx   s/p 2 wks doxy  . Status post dilation of esophageal narrowing   . TIA (transient ischemic attack) 1997   mild residual L mouth droop  . Tubular adenoma of colon     Past Surgical History:  Procedure Laterality Date  .  ANTERIOR HIP REVISION Left 08/17/2018   Procedure: ANTERIOR LEFT HIP REVISION;  Surgeon: Mcarthur Rossetti, MD;  Location: [facility];  Service: Orthopedics;  Laterality: Left;  . ANTERIOR HIP REVISION Left 11/02/2018   Procedure: LEFT HIP CONSTRAINED LINER REVISION;  Surgeon: Mcarthur Rossetti, MD;  Location: [facility];  Service: Orthopedics;  Laterality: Left;  . CHOLECYSTECTOMY    . COLONOSCOPY WITH PROPOFOL N/A 12/28/2012   Procedure: COLONOSCOPY WITH PROPOFOL;  Surgeon: Jerene Bears, MD;  Location: [facility];  Service: Gastroenterology;  Laterality: N/A;  . CORONARY ARTERY BYPASS GRAFT  2010   triple bypass  . ESOPHAGOGASTRODUODENOSCOPY (EGD) WITH PROPOFOL N/A 12/28/2012    Procedure: ESOPHAGOGASTRODUODENOSCOPY (EGD) WITH PROPOFOL;  Surgeon: Jerene Bears, MD;  [facility]: [facility];  Service: Gastroenterology;  Laterality: N/A;  . ESOPHAGOGASTRODUODENOSCOPY (EGD) WITH PROPOFOL N/A 03/15/2013   Procedure: ESOPHAGOGASTRODUODENOSCOPY (EGD) WITH PROPOFOL;  Surgeon: Jerene Bears, MD;  [facility];  Service: Gastroenterology;  Laterality: N/A;  . ESOPHAGOGASTRODUODENOSCOPY (EGD) WITH PROPOFOL N/A 02/07/2016   Procedure: ESOPHAGOGASTRODUODENOSCOPY (EGD) WITH PROPOFOL;  Surgeon: Jerene Bears, MD;  [facility];  Service: Gastroenterology;  Laterality: N/A;  . HARDWARE REMOVAL N/A 07/02/2012   Procedure: HARDWARE REMOVAL;  Surgeon: Elaina Hoops, MD;  Location: [facility];  Service: Neurosurgery;  Laterality: N/A;  . HIATAL HERNIA REPAIR     wrap  . HIP CLOSED REDUCTION Left 08/15/2018   Procedure: CLOSED REDUCTION HIP;  Surgeon: Newt Minion, MD;  Location: [facility];  Service: Orthopedics;  Laterality: Left;  . HIP CLOSED REDUCTION Left 08/28/2018   Procedure: CLOSED REDUCTION HIP FOR RECURRENT DISLOCATION AND DRESSING CHANGE;  Surgeon: Jessy Oto, MD;  Location: [facility];  Service: Orthopedics;  Laterality: Left;  . HIP CLOSED REDUCTION Left 09/15/2018   Procedure: CLOSED REDUCTION HIP DISLOCATION;  Surgeon: Mcarthur Rossetti, MD;  Location: [facility];  Service: Orthopedics;  Laterality: Left;  . HIP CLOSED REDUCTION Left 10/31/2018   Procedure: CLOSED REDUCTION LEFT TOTAL HIP;  Surgeon: Leandrew Koyanagi, MD;  Location: [facility];  Service: Orthopedics;  Laterality: Left;  . INGUINAL HERNIA REPAIR Bilateral   . JOINT REPLACEMENT Left 1999  . KNEE ARTHROPLASTY  07/22/2011   Procedure: COMPUTER ASSISTED TOTAL KNEE ARTHROPLASTY;  Surgeon: Meredith Pel, MD;  Location: [facility];  Service: Orthopedics;  Laterality: Right;  Right total knee arthroplasty  . MANDIBLE SURGERY Bilateral    tmj  . REPLACEMENT TOTAL KNEE Bilateral   . ring around testicle hernia reapir   184 and 1986   x 2  . ROTATOR CUFF REPAIR Right   . SHOULDER SURGERY Left   . SPINE SURGERY  2010   "rod and screws", "failed", lopwer spine,   . stent to heart x 1  2010  . TONSILLECTOMY    . TOTAL HIP ARTHROPLASTY Left 08/13/2018   Procedure: LEFT TOTAL HIP ARTHROPLASTY ANTERIOR APPROACH;  Surgeon: Mcarthur Rossetti, MD;  Location: [facility];  Service: Orthopedics;  Laterality: Left;  . TOTAL HIP ARTHROPLASTY Left 09/17/2018   Procedure: ANTERIOR LEFT HIP REVISION;  Surgeon: Mcarthur Rossetti, MD;  Location: [facility];  Service: Orthopedics;  Laterality: Left;  . UMBILICAL HERNIA REPAIR     x 1  . varicose vein     stripping  . VIDEO BRONCHOSCOPY Bilateral 10/16/2015   Procedure: VIDEO BRONCHOSCOPY WITHOUT FLUORO;  Surgeon: Brand Males, MD;  Location: [facility];  Service: Cardiopulmonary;  Laterality: Bilateral;     Current Outpatient  Medications  Medication Sig Dispense Refill  . acetaminophen (TYLENOL) 500 MG tablet Take 500-1,000 mg by mouth every 6 (six) hours as needed for mild pain or moderate pain.     [name] atorvastatin (LIPITOR) 10 MG tablet TAKE 1 TABLET BY MOUTH AT BEDTIME (Patient taking differently: Take 10 mg by mouth at bedtime. ) 30 tablet 9  . bictegravir-emtricitabine-tenofovir AF (BIKTARVY) 50-200-25 MG TABS tablet TAKE 1 TABLET BY MOUTH EVERY DAY    . clindamycin (CLEOCIN) 300 MG capsule Take two by mouth one hour before dental appointment, then two by mouth six hours after appointment 12 capsule 0  . clotrimazole-betamethasone (LOTRISONE) cream Apply 1 application topically 2 (two) times daily. 30 g 1  . darunavir-cobicistat (PREZCOBIX) 800-150 MG tablet PLEASE SEE ATTACHED FOR DETAILED DIRECTIONS    . EPINEPHrine (EPIPEN 2-PAK) 0.3 mg/0.3 mL IJ SOAJ injection Inject 0.3 mg into the muscle as needed for anaphylaxis.    [name] escitalopram (LEXAPRO) 20 MG tablet Take 1 tablet (20 mg total) by mouth daily. 90 tablet 1  . EYSUVIS 0.25 % SUSP     . famotidine (PEPCID)  40 MG tablet TAKE 1 TABLET BY MOUTH TWICE A DAY (Patient taking differently: Take 40 mg by mouth 2 (two) times daily. ) [id number] tablet 0  . folic acid (FOLVITE) 1 MG tablet Take 1 mg by mouth daily.  11  . l-methylfolate-B6-B12 (METANX) 3-35-2 MG TABS tablet Take 1 tablet by mouth 2 (two) times daily. 60 tablet 5  . leflunomide (ARAVA) 20 MG tablet Take 20 mg by mouth daily.     . metoprolol succinate (TOPROL-XL) 50 MG 24 hr tablet Take 1 tablet (50 mg total) by mouth daily. 90 tablet 3  . nitroGLYCERIN (NITROSTAT) 0.4 MG SL tablet Place 1 tablet (0.4 mg total) under the tongue every 5 (five) minutes as needed for chest pain. 25 tablet 2  . ondansetron (ZOFRAN) 4 MG tablet Take 4 mg by mouth every 6 (six) hours as needed for nausea.    . pantoprazole (PROTONIX) 40 MG tablet Take by mouth.    . predniSONE (DELTASONE) 5 MG tablet Take 5 mg by mouth 2 (two) times daily.   1  . pregabalin (LYRICA) 200 MG capsule TAKE 1 CAPSULE BY MOUTH TWICE A DAY (Patient taking differently: Take 200 mg by mouth 2 (two) times daily. ) 60 capsule 3  . SAVAYSA 30 MG TABS tablet TAKE 1 TABLET (30 MG TOTAL) BY MOUTH DAILY. 30 tablet 5  . sulfamethoxazole-trimethoprim (BACTRIM DS) 800-160 MG tablet TAKE 1 TABLET BY MOUTH EVERY 12 HOURS 60 tablet 3  . sulfaSALAzine (AZULFIDINE) 500 MG tablet TAKE 1 TABLET BY MOUTH TWICE A DAY    . tamsulosin (FLOMAX) 0.4 MG CAPS capsule TAKE 1 CAPSULE (0.4 MG TOTAL) BY MOUTH DAILY AFTER SUPPER. 30 capsule 1  . torsemide (DEMADEX) 20 MG tablet TAKE 1 TABLET BY MOUTH EVERY DAY (Patient taking differently: Take 20 mg by mouth daily. ) 90 tablet 1  . valACYclovir (VALTREX) 500 MG tablet TAKE 1 TABLET BY MOUTH EVERY DAY (Patient taking differently: Take 500 mg by mouth daily. ) 30 tablet 2  . ezetimibe (ZETIA) 10 MG tablet Take 1 tablet (10 mg total) by mouth daily. 90 tablet 3   No current facility-administered medications for this visit.    Allergies:   Golimumab, Orencia [abatacept], Other,  Peanut-containing drug products, Morphine, Oxycodone-acetaminophen, Penicillins, and Promethazine hcl    Social History:  The patient  reports that he has never  smoked. He has never used smokeless tobacco. He reports current alcohol use. He reports that he does not use drugs.   Family History:  The patient's family history includes Alcohol abuse in his daughter; Asthma in his brother; Breast cancer in his mother; CVA in his brother; Colon polyps in his father; Crohn's disease in his paternal aunt; Diabetes in his brother, maternal grandmother, and mother; Drug abuse in his daughter; Heart attack in his brother and brother; Heart disease in his brother; Hyperlipidemia in his brother, father, and mother; Hypertension in his mother; Prostate cancer in his father.    ROS:  Please see the history of present illness.   Otherwise, review of systems are positive for none.   All other systems are reviewed and negative.    PHYSICAL EXAM: VS:  BP 106/62   Pulse 77   Ht 5\' 9"  (1.753 m)   Wt 175 lb 6.4 oz (79.6 kg)   SpO2 93%   BMI 25.90 kg/m  , BMI Body mass index is 25.9 kg/m. GEN: Sitting on his riding chair in no acute distress HEENT: sclera anicteric Neck: no JVD, carotid bruits, or masses Cardiac: RRR; +murmur, no rubs or gallops, 1+ LE edema  Respiratory:  clear to auscultation bilaterally, normal work of breathing GI: soft, nontender, nondistended, + BS MS: no deformity or atrophy Skin: warm and dry, no rash Neuro:  Strength and sensation are intact Psych: euthymic mood, full affect   EKG:  EKG is ordered today. The ekg ordered today demonstrates sinus rhythm, rate 77 bpm, no STE/D, no TWI.    Recent Labs: 11/16/2018: Pro B Natriuretic peptide (BNP) 163.0 11/19/2018: Magnesium 1.9 06/06/2019: TSH 1.830 08/16/2019: ALT 12 09/15/2019: BUN 15; Creatinine, Ser 1.11; Hemoglobin 13.0; Platelets 139; Potassium 3.6; Sodium 138    Lipid Panel    Component Value Date/Time   CHOL 214 (H)  08/04/2019 0950   TRIG 180 (H) 08/04/2019 0950   HDL 40 08/04/2019 0950   CHOLHDL 5.4 (H) 08/04/2019 0950   VLDL 30 08/14/2016 1616   LDLCALC 142 (H) 08/04/2019 0950      Wt Readings from Last 3 Encounters:  10/14/19 175 lb 6.4 oz (79.6 kg)  10/13/19 174 lb 9.6 oz (79.2 kg)  09/21/19 170 lb (77.1 kg)      Other studies Reviewed: Additional studies/ records that were reviewed today include:   Echocardiogram 11/2018: 1. The left ventricle has normal systolic function with an ejection  fraction of 60-65%. The cavity size was normal. Left ventricular diastolic  Doppler parameters are consistent with impaired relaxation.  2. The right ventricle has normal systolic function. The cavity was  mildly enlarged. There is no increase in right ventricular wall thickness.  3. Right atrial size was mildly dilated.  4. The aortic valve is tricuspid. Mild thickening of the aortic valve.  Moderate calcification of the aortic valve. Aortic valve regurgitation is  mild by color flow Doppler. Mild stenosis of the aortic valve.  5. Peak aortic velocity: 2.26m/s, mean gradient 54mmHg.  6. The aorta is normal in size and structure.   NST 2016: Overall Impression:  Intermediate risk stress nuclear study with moderate-size and intensity, fixed apical/septal defect suggestive of scar. No reversible ischemia.  LV Wall Motion:  Septal hypokinesis/dyskineis and apical akinesis, EF 50%  Cardiac cath 2010: ANGIOGRAPHIC FINDINGS:  1. The left main coronary artery had a distal tapering to 50%.  2. The left anterior descending was found to be 100% occluded in the  proximal portion at the beginning of the case.  After the vessel      was opened, it was obvious that there was a moderate-sized diagonal      branch down beyond the total occlusion that had an 80% ostial      stenosis.  Just beyond that diagonal branch was a 60-70% stenosis      in the mid LAD.  There was plaque noted in the distal LAD.   3. The circumflex artery gave off an early ramus intermedius branch      that had an ostial 50% stenosis.  This was a moderate-sized vessel.      There was a 40% stenosis in the proximal portion of the ramus      intermedius.  The ostial circumflex had a 40% stenosis.  The      proximal circumflex had a 50% stenosis.  The first obtuse marginal      was a small-to-moderate caliber vessel with a 70% ostial stenosis.      The second obtuse marginal was a large bifurcating vessel with      plaque disease only.  4. The right coronary artery had diffuse 40% plaque in the proximal      portion.  The mid vessel had a 70-75% stenosis.  The distal right      coronary artery had plaque disease.  The posterior descending      artery and posterolateral branch only had mild plaque disease.  5. Left ventricular angiogram was performed in the RAO projection and      showed mild hypokinesis of the apex.  The overall left ventricular      systolic function was preserved.  Ejection fraction was 50-55%.      There was no evidence of mitral regurgitation.   IMPRESSION:  1. Acute anterior ST-segment elevation myocardial infarction with      total occlusion of the proximal left anterior descending coronary      artery.  The patient is now status post placement of a bare-metal      stent in the proximal left anterior descending coronary artery.  2. Mild segmental left ventricular dysfunction.   RECOMMENDATIONS:  The patient should be continued on aspirin and Plavix  for at least 6 weeks, but preferably longer and up to 1 year if he  tolerates this.  He was given 600 mg of Plavix here in the cath lab and  will be started on 150 mg of Plavix on a daily basis for 1 week and then  75 mg on daily basis for as long as he tolerates this medication.  He  will be started on high-dose statin agent and a low-dose beta-blocker.  We will hold on starting an ACE inhibitor today secondary to his  relative hypotension.   The patient does have a history of recent DVT and  has been maintained on Coumadin therapy.  We will resume his heparin  drip 6 hours after the sheath has been removed.  He will be admitted to  the CCU and watched closely.    ASSESSMENT AND PLAN:  1. Preoperative assessment: patient is anticipating hip replacement surgery. He has chronic DOE, seen by pulmonology 10/13/19 a felt to have progression of his ILD. No complaints of chest pain with moping/vacuuming. Limited mostly by hip pain/leg weakness. EKG is without ischemic changes. Last stress test in 2016 showed evidence of a scar but no ischemia. Echo 11/2018 with EF 60-65%, G1DD, no RWMA. Suspect DOE  is related to underlying pulmonary disease.  - Favor proceeding with planned surgery without further cardiac work-up, however given inability to complete 4 METs, will route to Dr. Percival Spanish for input.   2. CAD s/p CABG in 2010: chronic DOE but no chest pain complaints.  - Continue aspirin and statin - Continue BBlocker  3. HTN: BP 106/62 today - Continue metoprolol succinate and torsemide  4. HLD: LDL 142 07/2019 - Will start zetia 10mg  daily given interaction between HAART therapy and statins - Continue low dose atorvastatin  5. PSVT: palpitations well controlled on BBlocker - Continue metoprolol  6. Aortic valvulopathy: mild AI/AS on echo 11/2018, no symptoms to suggest progression of disease.  - Continue routine monitoring   Current medicines are reviewed at length with the patient today.  The patient does not have concerns regarding medicines.  The following changes have been made:  As above  Labs/ tests ordered today include:   Orders Placed This Encounter  Procedures  . EKG 12-Lead     Disposition:   FU with Dr. Percival Spanish in 6 months  Signed, Abigail Butts, PA-C  10/14/2019 8:53 AM      Dr. Percival Spanish is in agreement. [place] would be at acceptable risk for the planned procedure without further cardiovascular  testing.   I will route this recommendation to the requesting party via Epic fax function.   Abigail Butts, PA-C 10/16/2019, 8:35 PM

## 2019-10-13 NOTE — Progress Notes (Signed)
HPI 69 yo never smoker with RA followed by [name]  Has HIV and CLL .    Admit date: 06/18/2015 Discharge date: 06/20/2015  Discharge Diagnoses:  Principal Problem:  Community acquired pneumonia Active Problems:  CAD (coronary artery disease) of artery bypass graft  Type II diabetes mellitus with manifestations (HCC)  HIV disease ([place])  Chronic lymphoblastic leukemia ([place])  Hypertension  DVT, lower extremity, recurrent ([place])  Pneumonia  Dyspnea   06/29/2015  f/u ov/Wert re: NSIP presumably due to RA lung dz/ poor control of arthritis chronically / s/p ? CAP  Chief Complaint  Patient presents with  . Follow-up    PFT done today. Pt hospitalized from 06/18/15-06/20/15 with PNA. He feels his breathing has improved since d/c. Cough is prod wit minimal, thick, dark yellow sputum.    mucous now has turned clear p finished abx /min volume in ams  / activity tol back near baseline s resp meds   No obvious day to day or daytime variability or assoc cp or chest tightness, subjective wheeze or overt sinus or hb symptoms. No unusual exp hx or h/o childhood pna/ asthma or knowledge of premature birth.  Sleeping ok without nocturnal  or early am exacerbation  of respiratory  c/o's or need for noct saba. Also denies any obvious fluctuation of symptoms with weather or environmental changes or other aggravating or alleviating factors except as outlined above     OV 09/13/2015  Chief Complaint  Patient presents with  . Follow-up    Pt changing providers from MW to MR for pulmonary fibrosis. Pt c/o DOE. Pt denies cough, CP/tightness.   Is a transfer of care from Dr. Legrand Como wert to Dr. Fuller Plan  69 year old male immunosuppressed on basis of HIV and rheumatoid arthritis. Given necrosis factor Alpha blockade and Imuran. He appears to have CT findings of interstitial lung disease starting in September 2016 and progressing all the way through end of February 2017. A 2015 CT  chest did not show any interstitial lung disease  He reports that December 2016 he reported to pulmonary office for worsening shortness of breath. Noticed to have interstitial lung disease changes. Clinical diagnosis that interstitial lung disease was related to rheumatoid arthritis was made [his HIV viral load was low and CD4 counts were normal all the way since 2010]. This point in time he had been on infliximab for 3 years. A decision was made to add Imuran to the regimen. At this point in time he was not on Bactrim. Subsequently he got sick early every 2017 and was admitted to the hospital with a clinical diagnosis of community acquired pneumonia and treated with antibiotics and discharged. Subsequently readmitted end of February 2017 for 10 days with hypoxemic respiratory failure. Treated with broad-spectrum antibiotics but did not improve. Finally started on Bactrim for presumed Pneumocystis carinii pneumonia according to the patient and then began to improve. This was when he had last CT scan of the chest which comparatively shows progressive ILD changes.  At this point in time he is on immunosuppressive medications. Most recently saw Dr. Riley Churches and infectious diseases his CD4 count has dropped for the first time and he believes this is due to stress. He is compliant with this anti-retroviral therapy Genovaya. He is also on anticoagulation. Overall he is feeling much better physically stronger and less short of breath. Although he still does have shortness of breath with exertion infection in the office he did desaturate.  Walking desat test 185 feet x 3 laps on RA -> desat to 87% at 2nd lap and got dyspneic and wanted to stop  10/25/2015 Follow up: ILD  Patient returns for a 6 week follow-up for ILD. Patient was recently seen by Dr. Chase Caller for worsening shortness of breath since December 2016. He was noticed to have ILD changes on CT chest. Diagnosis with interstitial lung disease  related to rheumatoid arthritis was made. He does have HIV with CD4 counts normal all the way back since 2010. He was started on Imuran at that time. Patient was admitted earlier this year with pneumonia and readmitted in February 2017 for acute hypoxic respiratory failure. He chest in February 2017 showed progressive patchy opacities in both lungs. Repeat CT chest 09/27/2015 showed a significant decrease in groundglass attenuation bilaterally. Fibrotic changes are considered stable since February 2017 but increased since January 2015 Patient returns today after undergoing a bronchoscopy on 10/16/2015. Ultracet been negative to date. Cytology showed benign reactive changes. Patient says overall he is feeling okay. He continues to use oxygen with activity and at bedtime. He denies any flare cough or dyspnea.  Has been having more reflux . Has intermittent dysphagia .  Chokes easily . Previous hx of esophageal surgery .    OV 01/24/2016  Chief Complaint  Patient presents with  . Follow-up    3 month ILD follow up & PFT review - reports breathing is at baseline.  was in [facility] July 2017 for PNA     follow-up interstitial lung disease in te setting of rheumatoid arthriti on Imuran and HIV disease with  Normal CD4 count nd antiretroviratherapy.   he is on HARRT and TNF alpha blockade treatment aocumented below. Overall snce last isit he is doing wll. Dyspnea is improved. In the interim approximately June 2017ot admitted at [facility] of "fluid in lungs" .  Now back tbaeline  PFT is normal but for low dlco that is moderate 65%. This is improved from early 2017 and similar to year ago. CT chest today just mild basal ILD - NSIP pattern - personally visuzalied    OV 10/13/2019 -patient last seen by me in 2017 September.  Is been over 3 years there for this new visit.  Subjective:  Patient ID: [name], male , DOB: [birth year] , age 59 y.o. , MRN: [id number] , ADDRESS: [id number]   10/13/2019 -   Chief Complaint  Patient presents with  . Consult    Doe for years   Immunosuppressed on basis of HIV and rheumatoid arthritis. Given necrosis factor Alpha blockade and Imuran. He appears to have CT findings of interstitial lung disease starting in September 2016 and progressing all the way through end of February 2017. A 2015 CT chest did not show any interstitial lung disease  June 2017 BAL: Eosinophils 10% and lymphocytes 55%.   Normal CD4 count nd antiretroviratherapy.  HPI [name] 69 y.o. -personally seen over 3 years ago.  After that not seen and not followed up.  He says in the interim his HIV continues to be under control with good CD4 counts.  He is compliant with his antiretroviral therapy.  He is no longer on oxygen though for his interstitial lung disease.  His 2017 BAL showed lymphocytes and eosinophils.  Negative for cultures.  He says slow and steadily his dyspnea is gotten worse.  However when we walked in he is only able to walk  half a lap and stop because of hip pain and dyspnea but did not desaturate but got very tachycardic.  He says that his left hip has bothered him probably from a combination of osteoarthritis and rheumatoid arthritis.  He has had hip replacement but now the plastic socket is loose and needs to be repaired.  He is here for preoperative clearance.  He says he is in significant amount of pain.  He wants a surgery done soon.  His recent renal function and nutritional status is adequate.  His hemoglobin recently is also adequate no recent respiratory infections.  His pulse ox on room air at rest is fine.  His last CT scan of the chest and pulmonary function test was all several years ago and is documented below.  He is agreeable to have this restaged.  I explained to him the new antifibrotic indications and non-IPF progressive ILD.  However he wants to have his hip surgery first and wants a clearance.    Simple  office walk 185 feet x  3 laps goal with forehead probe 10/13/2019   O2 used ra  Number laps completed 3 attempted by did only 1 and stopped due to hip pain  Comments about pace slow  Resting Pulse Ox/HR 95% and 84/min  Final Pulse Ox/HR 95% and 112/min  Desaturated </= 88% no  Desaturated <= 3% points no  Got Tachycardic >/= 90/min yes  Symptoms at end of test dspnea nad hip pain  Miscellaneous comments x       Results for Azar, Dax L "PHIL" (MRN [id number]) as of 10/13/2019 10:00  Ref. Range 01/24/2016 08:53  FVC-Pre Latest Units: L 3.46  FVC-%Pred-Pre Latest Units: % 93  FEV1-Pre Latest Units: L 2.68  FEV1-%Pred-Pre Latest Units: % 98  Pre FEV1/FVC ratio Latest Units: % 78  Results for Denzler, Josemanuel L "PHIL" (MRN [id number]) as of 10/13/2019 10:00  Ref. Range 01/24/2016 08:53  TLC Latest Units: L 5.70  TLC % pred Latest Units: % 94   Results for Vidrine, Vishnu L "PHIL" (MRN [id number]) as of 10/13/2019 10:00  Ref. Range 01/24/2016 08:53  FEV1-%Pred-Post Latest Units: % 106    He had a chest x-ray August 01, 2019 that I personally visualized.  Compared to the one in 2020 it appears that he has new onset of streaky atelectasis although he could have had the same thing in 2020 and is just more prominent now.   ROS - Results for Dilorenzo, Angas L "PHIL" (MRN [id number]) as of 10/13/2019 10:00  Ref. Range 02/18/2016 15:37 05/13/2016 14:05 05/15/2016 08:31 05/16/2016 08:13 05/17/2016 07:01 05/18/2016 04:02 05/19/2016 08:17 05/20/2016 08:36 05/21/2016 05:31 05/23/2016 05:47 06/25/2016 09:39 07/04/2016 07:49 07/09/2016 00:00 07/22/2016 00:00 08/13/2016 09:18 12/25/2016 10:56 02/12/2017 09:47 07/09/2017 09:13 08/13/2017 09:16 11/19/2017 11:42 01/07/2018 08:52 05/13/2018 08:37 08/11/2018 08:48 08/14/2018 01:45 08/16/2018 04:11 08/18/2018 09:33 08/28/2018 14:12 08/28/2018 14:12 09/15/2018 01:02 09/16/2018 08:36 09/18/2018 04:17 09/19/2018 04:40 09/20/2018 04:40 09/26/2018 16:25 10/28/2018 11:04 11/03/2018 05:21 11/16/2018 15:26 11/19/2018 12:50 12/16/2018  09:02 03/24/2019 13:03 06/06/2019 11:56 08/04/2019 09:50 08/16/2019 09:46 09/15/2019 10:15  Hemoglobin Latest Ref Range: 13.0 - 17.0 g/dL 12.7 (L) 12.2 (L) 11.1 (L) 11.4 (L) 11.9 (L) 11.0 (L) 12.5 (L) 12.4 (L) 12.0 (L) 11.5 (L) 13.9 9.6 (L) 9.2 (A) 10.3 (A) 10.6 (L) 12.5 (L) 12.4 (L) 13.6 14.1 13.7 14.3 13.6 13.9 10.0 (L) 10.1 (L) 10.0 (L) 9.2 (L)  10.7 (L) 11.3 (L) 7.2 (L) 8.1 (L) 8.4 (L) 10.1 (L) 11.2 (L) 7.3 (L) 9.5 (L)  10.4 (L) 11.9 (L) 12.8 (L) 12.9 (L) 12.9 (L) 12.3 (L) 13.0   Results for Berwanger, Raynard L "PHIL" (MRN [id number]) as of 10/13/2019 10:00  Ref. Range 05/15/2016 02:48 05/15/2016 08:31 05/16/2016 08:13 05/17/2016 07:01 05/18/2016 04:02 05/19/2016 08:17 05/20/2016 08:36 05/21/2016 05:31 05/23/2016 05:47 06/25/2016 09:39 07/04/2016 07:49 07/09/2016 00:00 08/13/2016 09:19 08/13/2016 09:19 08/14/2016 16:16 12/25/2016 10:56 07/09/2017 09:13 11/19/2017 11:42 11/19/2017 11:48 01/07/2018 08:52 01/21/2018 10:52 05/13/2018 08:37 07/01/2018 10:19 08/11/2018 08:48 08/14/2018 01:45 08/28/2018 14:12 09/15/2018 01:02 09/18/2018 04:17 09/26/2018 16:25 09/26/2018 17:48 10/28/2018 11:04 11/03/2018 05:21 11/16/2018 15:26 11/19/2018 12:50 11/19/2018 13:55 12/02/2018 09:09 12/08/2018 11:07 12/16/2018 09:02 01/31/2019 10:46 03/24/2019 13:03 06/06/2019 11:56 08/04/2019 09:50 08/16/2019 09:46 09/15/2019 10:15  Creatinine Latest Ref Range: 0.61 - 1.24 mg/dL  0.96 0.82 0.70 0.81 0.86 0.80 0.82 0.92 1.05 0.88 0.9 1.1   0.96 1.12 1.20  1.12  0.84  1.05 0.88 0.95 0.68 0.63 0.75  0.83 0.80  0.77  0.88 0.93 0.80 0.83  0.85 1.00 0.90 1.11     Echocardiogram November 30, 2018 -Normal ejection fraction with some amount of diastolic dysfunction mild aortic stenosis.  [In 2016 he had cardiac stress test that showed ejection fraction of 50% with old scar]  Results for Oboyle, Lio L "PHIL" (MRN [id number]) as of 10/13/2019 10:00  Ref. Range 10/16/2015 08:01  Monocyte-Macrophage-Serous Fluid Latest Ref Range: 50 - 90 % 23 (L)  Other Cells, Fluid Latest Units: % MANY  Fluid Type-FCT Unknown  BRONCHIAL ALVEOLAR LAVAGE  Color, Fluid Latest Ref Range: YELLOW  COLORLESS (A)  Total Nucleated Cell Count, Fluid Latest Ref Range: 0 - 1,000 cu mm 115  Lymphs, Fluid Latest Units: % 55  Eos, Fluid Latest Units: % 10  Appearance, Fluid Latest Ref Range: CLEAR  HAZY (A)  Neutrophil Count, Fluid Latest Ref Range: 0 - 25 % 12   Ct Chest High Resolution  Result Date: 01/24/2016 CLINICAL DATA:  69 year old male with shortness of breath on exertion. History of pneumonia in May 2017. History of rheumatoid arthritis. Evaluate for interstitial lung disease. EXAM: CT CHEST WITHOUT CONTRAST TECHNIQUE: Multidetector CT imaging of the chest was performed following the standard protocol without intravenous contrast. High resolution imaging of the lungs, as well as inspiratory and expiratory imaging, was performed. COMPARISON:  Multiple priors, most recently 09/27/2015. FINDINGS: Cardiovascular: Heart size is mildly enlarged. There is no significant pericardial fluid, thickening or pericardial calcification. There is aortic atherosclerosis, as well as atherosclerosis of the great vessels of the mediastinum and the coronary arteries, including calcified atherosclerotic plaque in the left main, left anterior descending, left circumflex and right coronary arteries. Status post median sternotomy for CABG, including LIMA to the LAD. Curvilinear hypoattenuation in the myocardium of the mid to distal LAD territory, suggestive of fibrofatty metaplasia related to prior LAD territory myocardial infarction. Mediastinum/Nodes: There are multiple prominent but nonenlarged mediastinal and bilateral hilar lymph nodes, many of which are densely calcified (nonspecific). Small hiatal hernia. Surgical clips near the gastroesophageal junction. No axillary lymphadenopathy. Lungs/Pleura: High-resolution again demonstrates some patchy areas of peripheral predominant ground-glass attenuation. This has no definitive craniocaudal gradient. Some  patchy areas of associated septal thickening and subpleural reticulation with scattered areas of very mild cylindrical bronchiectasis are also noted, essentially unchanged compared to the prior study. No honeycombing. Inspiratory and expiratory imaging demonstrates some moderate air trapping, indicative of small airways disease. Scarring in the inferior segment of the lingula is unchanged. No acute consolidative airspace disease. No pleural effusions. No definite suspicious appearing pulmonary nodules  or masses are noted. Scattered calcified granulomas. Upper Abdomen: Calcified granuloma [place] in the liver and spleen. Status post cholecystectomy. Aortic atherosclerosis. Musculoskeletal: Median sternotomy wires. IMPRESSION: 1. The appearance of the chest appears essentially unchanged compared to the prior study from 09/27/2015, again most suggestive of underlying nonspecific interstitial pneumonia (NSIP) as a manifestation of rheumatoid lung disease. No definitive findings to suggest UIP at this time. 2. Aortic atherosclerosis, in addition to left main and 3 vessel coronary artery disease. Status post median sternotomy for CABG. 3. Old granulomatous disease, as above. Electronically Signed   By: Vinnie Langton M.D.   On: 01/24/2016 10:16     per HPI has a past medical history of Allergy, Anxiety, Carotid artery occlusion, Cataract, Chronic back pain, CLL (chronic lymphoblastic leukemia) (dx 2010), Clotting disorder ([place]), Coronary artery disease (2010), Depression, Diverticulosis, DVT, lower extremity, recurrent ([place]) (2008, 2009), Esophagitis, Fibromyalgia, Gallstones, GERD (gastroesophageal reflux disease), Gout, Gynecomastia, male, H/O hiatal hernia (2008), Hemorrhoids, Hepatitis A (yrs ago), HIV infection ([place]) (dx 1993), Hypertension, Impotence of organic origin, Myocardial infarction ([place]) (2010), Neuromuscular disorder ([place]), Osteoarthritis, knee, Osteoporosis, Rheumatoid arthritis(714.0) (dx 2010),  Seasonal allergies, Secondary syphilis (07/24/14 dx), Status post dilation of esophageal narrowing, TIA (transient ischemic attack) (1997), and Tubular adenoma of colon.   reports that he has never smoked. He has never used smokeless tobacco.  Past Surgical History:  Procedure Laterality Date  . ANTERIOR HIP REVISION Left 08/17/2018   Procedure: ANTERIOR LEFT HIP REVISION;  Surgeon: Mcarthur Rossetti, MD;  Location: [place];  Service: Orthopedics;  Laterality: Left;  . ANTERIOR HIP REVISION Left 11/02/2018   Procedure: LEFT HIP CONSTRAINED LINER REVISION;  Surgeon: Mcarthur Rossetti, MD;  Location: [place];  Service: Orthopedics;  Laterality: Left;  . CHOLECYSTECTOMY    . COLONOSCOPY WITH PROPOFOL N/A 12/28/2012   Procedure: COLONOSCOPY WITH PROPOFOL;  Surgeon: Jerene Bears, MD;  Location: [facility];  Service: Gastroenterology;  Laterality: N/A;  . CORONARY ARTERY BYPASS GRAFT  2010   triple bypass  . ESOPHAGOGASTRODUODENOSCOPY (EGD) WITH PROPOFOL N/A 12/28/2012   Procedure: ESOPHAGOGASTRODUODENOSCOPY (EGD) WITH PROPOFOL;  Surgeon: Jerene Bears, MD;  Location: [facility];  Service: Gastroenterology;  Laterality: N/A;  . ESOPHAGOGASTRODUODENOSCOPY (EGD) WITH PROPOFOL N/A 03/15/2013   Procedure: ESOPHAGOGASTRODUODENOSCOPY (EGD) WITH PROPOFOL;  Surgeon: Jerene Bears, MD;  [facility]: [facility];  Service: Gastroenterology;  Laterality: N/A;  . ESOPHAGOGASTRODUODENOSCOPY (EGD) WITH PROPOFOL N/A 02/07/2016   Procedure: ESOPHAGOGASTRODUODENOSCOPY (EGD) WITH PROPOFOL;  Surgeon: Jerene Bears, MD;  [facility]: [facility];  Service: Gastroenterology;  Laterality: N/A;  . HARDWARE REMOVAL N/A 07/02/2012   Procedure: HARDWARE REMOVAL;  Surgeon: Elaina Hoops, MD;  Location: [facility];  Service: Neurosurgery;  Laterality: N/A;  . HIATAL HERNIA REPAIR     wrap  . HIP CLOSED REDUCTION Left 08/15/2018   Procedure: CLOSED REDUCTION HIP;  Surgeon: Newt Minion, MD;  Location: [facility] [place];  Service:  Orthopedics;  Laterality: Left;  . HIP CLOSED REDUCTION Left 08/28/2018   Procedure: CLOSED REDUCTION HIP FOR RECURRENT DISLOCATION AND DRESSING CHANGE;  Surgeon: Jessy Oto, MD;  Location: [place];  Service: Orthopedics;  Laterality: Left;  . HIP CLOSED REDUCTION Left 09/15/2018   Procedure: CLOSED REDUCTION HIP DISLOCATION;  Surgeon: Mcarthur Rossetti, MD;  Location: [place];  Service: Orthopedics;  Laterality: Left;  . HIP CLOSED REDUCTION Left 10/31/2018   Procedure: CLOSED REDUCTION LEFT TOTAL HIP;  Surgeon: Leandrew Koyanagi, MD;  Location: [place];  Service: Orthopedics;  Laterality: Left;  . INGUINAL HERNIA REPAIR Bilateral   . JOINT REPLACEMENT Left 1999  . KNEE ARTHROPLASTY  07/22/2011   Procedure: COMPUTER ASSISTED TOTAL KNEE ARTHROPLASTY;  Surgeon: Meredith Pel, MD;  Location: [facility];  Service: Orthopedics;  Laterality: Right;  Right total knee arthroplasty  . MANDIBLE SURGERY Bilateral    tmj  . REPLACEMENT TOTAL KNEE Bilateral   . ring around testicle hernia reapir  184 and 1986   x 2  . ROTATOR CUFF REPAIR Right   . SHOULDER SURGERY Left   . SPINE SURGERY  2010   "rod and screws", "failed", lopwer spine,   . stent to heart x 1  2010  . TONSILLECTOMY    . TOTAL HIP ARTHROPLASTY Left 08/13/2018   Procedure: LEFT TOTAL HIP ARTHROPLASTY ANTERIOR APPROACH;  Surgeon: Mcarthur Rossetti, MD;  Location: [place];  Service: Orthopedics;  Laterality: Left;  . TOTAL HIP ARTHROPLASTY Left 09/17/2018   Procedure: ANTERIOR LEFT HIP REVISION;  Surgeon: Mcarthur Rossetti, MD;  Location: [place] [facility];  Service: Orthopedics;  Laterality: Left;  . UMBILICAL HERNIA REPAIR     x 1  . varicose vein     stripping  . VIDEO BRONCHOSCOPY Bilateral 10/16/2015   Procedure: VIDEO BRONCHOSCOPY WITHOUT FLUORO;  Surgeon: Brand Males, MD;  [facility];  Service: Cardiopulmonary;  Laterality: Bilateral;    Allergies  Allergen Reactions  . Golimumab Anaphylaxis    Simponi ARIA  . Orencia  [Abatacept] Anaphylaxis  . Other Anaphylaxis and Hives    Pecans  . Peanut-Containing Drug Products Anaphylaxis, Hives and Swelling    Swelling of throat  . Morphine Other (See Comments)    Severe headache- Can tolerate Dilaudid, however  . Oxycodone-Acetaminophen Other (See Comments)    Headache 6.22.2020 patient is currently taking and tolerating  . Penicillins Rash and Other (See Comments)    FLUSHED & RED, also Has patient had a PCN reaction causing immediate rash, facial/tongue/throat swelling, SOB or lightheadedness with hypotension: Yes Has patient had a PCN reaction causing severe rash involving mucus membranes or skin necrosis: No Has patient had a PCN reaction that required hospitalization: No Has patient had a PCN reaction occurring within the last 10 years: No If all of the above answers are "NO", then may proceed with Cephalosporin use  . Promethazine Hcl Other (See Comments)    Makes him feel "drunk" at higher strengths    Immunization History  Administered Date(s) Administered  . Fluad Quad(high Dose 65+) 12/30/2018  . H1N1 04/28/2008  . Hepatitis A, Adult 09/07/2007, 03/10/2008  . Hepatitis B, adult 09/08/2013, 10/27/2013, 03/02/2014  . Influenza Split 01/17/2011, 02/11/2012, 02/18/2013, 02/10/2015  . Influenza, High Dose Seasonal PF 02/02/2014, 01/25/2016, 01/14/2018  . Influenza-Unspecified 02/13/2017  . PFIZER SARS-COV-2 Vaccination 06/23/2019, 07/19/2019  . PPD Test 02/22/2007, 06/12/2008  . Pneumococcal Conjugate-13 08/10/2014  . Pneumococcal Polysaccharide-23 05/13/2003, 12/08/2007, 01/16/2009, 10/27/2013  . Td 11/19/2011  . Zoster 03/02/2014    Family History  Problem Relation Age of Onset  . Breast cancer Mother   . Hypertension Mother   . Hyperlipidemia Mother   . Diabetes Mother   . Prostate cancer Father   . Colon polyps Father   . Hyperlipidemia Father   . Crohn's disease Paternal Aunt   . Diabetes Maternal Grandmother   . Diabetes Brother         x 3  . Heart attack Brother   . Heart disease Brother        x  3  . Heart attack Brother   . Hyperlipidemia Brother        x 3  . Alcohol abuse Daughter   . Drug abuse Daughter   . Asthma Brother   . CVA Brother   . Colon cancer Neg Hx   . Esophageal cancer Neg Hx   . Rectal cancer Neg Hx   . Stomach cancer Neg Hx      Current Outpatient Medications:  .  acetaminophen (TYLENOL) 500 MG tablet, Take 500-1,000 mg by mouth every 6 (six) hours as needed for mild pain or moderate pain. , Disp: , Rfl:  .  atorvastatin (LIPITOR) 10 MG tablet, TAKE 1 TABLET BY MOUTH AT BEDTIME (Patient taking differently: Take 10 mg by mouth at bedtime. ), Disp: 30 tablet, Rfl: 9 .  bictegravir-emtricitabine-tenofovir AF (BIKTARVY) 50-200-25 MG TABS tablet, TAKE 1 TABLET BY MOUTH EVERY DAY, Disp: , Rfl:  .  clindamycin (CLEOCIN) 300 MG capsule, Take two by mouth one hour before dental appointment, then two by mouth six hours after appointment, Disp: 12 capsule, Rfl: 0 .  clotrimazole-betamethasone (LOTRISONE) cream, Apply 1 application topically 2 (two) times daily., Disp: 30 g, Rfl: 1 .  darunavir-cobicistat (PREZCOBIX) 800-150 MG tablet, PLEASE SEE ATTACHED FOR DETAILED DIRECTIONS, Disp: , Rfl:  .  EPINEPHrine (EPIPEN 2-PAK) 0.3 mg/0.3 mL IJ SOAJ injection, Inject 0.3 mg into the muscle as needed for anaphylaxis., Disp: , Rfl:  .  escitalopram (LEXAPRO) 20 MG tablet, Take 1 tablet (20 mg total) by mouth daily., Disp: 90 tablet, Rfl: 1 .  EYSUVIS 0.25 % SUSP, , Disp: , Rfl:  .  famotidine (PEPCID) 40 MG tablet, TAKE 1 TABLET BY MOUTH TWICE A DAY (Patient taking differently: Take 40 mg by mouth 2 (two) times daily. ), Disp: 180 tablet, Rfl: 0 .  folic acid (FOLVITE) 1 MG tablet, Take 1 mg by mouth daily., Disp: , Rfl: 11 .  l-methylfolate-B6-B12 (METANX) 3-35-2 MG TABS tablet, Take 1 tablet by mouth 2 (two) times daily., Disp: 60 tablet, Rfl: 5 .  leflunomide (ARAVA) 20 MG tablet, Take 20 mg by mouth  daily. , Disp: , Rfl:  .  metoprolol succinate (TOPROL-XL) 50 MG 24 hr tablet, Take 1 tablet (50 mg total) by mouth daily., Disp: 90 tablet, Rfl: 3 .  ondansetron (ZOFRAN) 4 MG tablet, Take 4 mg by mouth every 6 (six) hours as needed for nausea., Disp: , Rfl:  .  pantoprazole (PROTONIX) 40 MG tablet, Take by mouth., Disp: , Rfl:  .  predniSONE (DELTASONE) 5 MG tablet, Take 5 mg by mouth 2 (two) times daily. , Disp: , Rfl: 1 .  pregabalin (LYRICA) 200 MG capsule, TAKE 1 CAPSULE BY MOUTH TWICE A DAY (Patient taking differently: Take 200 mg by mouth 2 (two) times daily. ), Disp: 60 capsule, Rfl: 3 .  SAVAYSA 30 MG TABS tablet, TAKE 1 TABLET (30 MG TOTAL) BY MOUTH DAILY., Disp: 30 tablet, Rfl: 5 .  sulfamethoxazole-trimethoprim (BACTRIM DS) 800-160 MG tablet, TAKE 1 TABLET BY MOUTH EVERY 12 HOURS, Disp: 60 tablet, Rfl: 3 .  sulfaSALAzine (AZULFIDINE) 500 MG tablet, TAKE 1 TABLET BY MOUTH TWICE A DAY, Disp: , Rfl:  .  tamsulosin (FLOMAX) 0.4 MG CAPS capsule, Take 1 capsule (0.4 mg total) by mouth daily after supper., Disp: 30 capsule, Rfl: 1 .  torsemide (DEMADEX) 20 MG tablet, TAKE 1 TABLET BY MOUTH EVERY DAY (Patient taking differently: Take 20 mg by mouth daily. ), Disp: 90 tablet, Rfl: 1 .  valACYclovir (VALTREX) 500 MG tablet, TAKE 1 TABLET BY MOUTH EVERY DAY (Patient taking differently: Take 500 mg by mouth daily. ), Disp: 30 tablet, Rfl: 2 .  albuterol (PROVENTIL) (2.5 MG/3ML) 0.083% nebulizer solution, Take 3 mLs (2.5 mg total) by nebulization every 6 (six) hours as needed for wheezing or shortness of breath., Disp: 150 mL, Rfl: 1 .  bictegravir-emtricitabine-tenofovir AF (BIKTARVY) 50-200-25 MG TABS tablet, Take 1 tablet by mouth daily., Disp: 30 tablet, Rfl: 11 .  darunavir-cobicistat (PREZCOBIX) 800-150 MG tablet, Take 1 tablet by mouth daily with breakfast. Swallow whole. Do NOT crush, break or chew tablets. Take with food., Disp: 30 tablet, Rfl: 11 .  doxycycline (VIBRA-TABS) 100 MG tablet,  TAKE 1 TABLET BY MOUTH TWICE A DAY, Disp: , Rfl:  .  furosemide (LASIX) 20 MG tablet, , Disp: , Rfl:  .  Glycopyrrolate (LONHALA MAGNAIR REFILL KIT) 25 MCG/ML SOLN, Inhale 1 Act into the lungs in the morning and at bedtime. (Patient taking differently: Inhale 1 Act into the lungs 2 (two) times daily as needed. ), Disp: 60 mL, Rfl: 5 .  nitroGLYCERIN (NITROSTAT) 0.4 MG SL tablet, Place 1 tablet (0.4 mg total) under the tongue every 5 (five) minutes as needed for chest pain., Disp: 25 tablet, Rfl: 2      Objective:   Vitals:   10/13/19 1003  BP: 110/60  Pulse: 87  Temp: 98.2 F (36.8 C)  TempSrc: Oral  SpO2: 95%  Weight: 174 lb 9.6 oz (79.2 kg)  Height: _0  (1.753 m)    Estimated body mass index is 25.78 kg/m as calculated from the following:   Height as of this encounter: _1  (1.753 m).   Weight as of this encounter: 174 lb 9.6 oz (79.2 kg).  _2 @  [employer]   10/13/19 1003  Weight: 174 lb 9.6 oz (79.2 kg)     Physical Exam Frail male with a beard sitting on the chair.  It is actually a scooter.  Oxygen is on the side.  He has got scoliosis with scars of surgery in the back.  He has faint bilateral bibasal crackles.  He is wearing a mask alert and oriented x3.  Abdomen soft normal heart sounds.     Assessment:       ICD-10-CM   1. Preop respiratory exam  Z01.811   2. ILD (interstitial lung disease) (HCC)  J84.9       1) RISK FOR PROLONGED MECHANICAL VENTILAION - > 48h  1A) Arozullah - Prolonged mech ventilation risk Arozullah Postperative Pulmonary Risk Score - for mech ventilation dependence >48h [employer], Ann Surg 2000, major non-cardiac surgery) Comment Score  Type of surgery - abd ao aneurysm (27), thoracic (21), neurosurgery / upper abdominal / vascular (21), neck (11) hipo 0  Emergency Surgery - (11)  0  ALbumin < 3 or poor nutritional state - (9) 3.5 in April 2021 0  BUN > 30 -  (8) 15 in may 2021 0  Partial or completely  dependent functional status - (7) Yes on scottoer  7  COPD -  (6) ILD 6  Age - 35 to 79 (4), > 70  (6) Age 73 6  TOTAL  19  Risk Stratifcation scores  - < 10 (0.5%), 11-19 (1.8%), 20-27 (4.2%), 28-40 (10.1%), >40 (26.6%)  Low moderate risk      R3) ISK FOR ANY POST-OP PULMONARY COMPLICATION Score source Risk  CANET/ARISCAT Score - risk for ANY/ALl pulmonary complications - > risk  of in-hospital post-op pulmonary complications (composite including respiratory failure, respiratory infection, pleural effusion, atelectasis, pneumothorax, bronchospasm, aspiration pneumonitis) [email or web address] - based on age, anemia, pulse ox, resp infection prior 30d, incision site, duration of surgery, and emergency v elective surgery *27 points - intermediate risk byut if surgery is < 2h is low risk      Plan:     Patient Instructions     ICD-10-CM   1. Preop respiratory exam  Z01.811   2. ILD (interstitial lung disease) (HCC)  J84.9    Preop respiratory exam  -Overall complications such as prolonged mechanical ventilation or ventilator dependence after hip surgery is low moderate -Likewise risk for any pulmonary complications such as pneumonia or heart failure or oxygen requirement is moderate -Risk can be significantly lowered if duration of surgery is less than 2 hours  -Overall I do not see any barriers for surgery  Plan -Recommend surgery in the hospital setting with routine postoperative care by orthopedic surgery that involves early mobilization and aggressive incentive spirometry and measures to prevent blood clots in the leg and lung  ILD (interstitial lung disease) ([place])  -Prior history this seems to have progressed but we need to stage this to get a better accurate picture because therapies in this area changing  Plan -Do high-resolution CT chest supine and prone in the next few to several days -Do overnight oxygen study in the  next few to several days -Do spirometry and DLCO breathing test sometime in August 2021  Follow-up -15-minute telephone visit with nurse practitioner in the next few to several days to review CT scan of the chest and overnight oxygen study -especially with a view towards your hip surgery  -30-minute face-to-face ILD clinic follow-up with Dr. Chase Caller in August 2021  I will forward this to Dr. Polly Cobia    Dr. Brand Males, M.D., F.C.C.P,  Pulmonary and Critical Care Medicine Staff Physician, [place] Director - Interstitial Lung Disease  Program  [facility] [place], [place], [address]  Pager: [phone], If no answer or between  15:00h - 7:00h: call [phone] Telephone: [phone]  10:42 AM 10/13/2019

## 2019-10-13 NOTE — Addendum Note (Signed)
Addended by: Vanessa Barbara on: 10/13/2019 12:07 PM   Modules accepted: Orders

## 2019-10-14 ENCOUNTER — Telehealth: Payer: Self-pay | Admitting: Internal Medicine

## 2019-10-14 ENCOUNTER — Ambulatory Visit (INDEPENDENT_AMBULATORY_CARE_PROVIDER_SITE_OTHER): Payer: Medicare Other | Admitting: Medical

## 2019-10-14 ENCOUNTER — Encounter: Payer: Self-pay | Admitting: Medical

## 2019-10-14 VITALS — BP 106/62 | HR 77 | Ht 69.0 in | Wt 175.4 lb

## 2019-10-14 DIAGNOSIS — I10 Essential (primary) hypertension: Secondary | ICD-10-CM | POA: Diagnosis not present

## 2019-10-14 DIAGNOSIS — I2510 Atherosclerotic heart disease of native coronary artery without angina pectoris: Secondary | ICD-10-CM

## 2019-10-14 DIAGNOSIS — I350 Nonrheumatic aortic (valve) stenosis: Secondary | ICD-10-CM

## 2019-10-14 DIAGNOSIS — I471 Supraventricular tachycardia: Secondary | ICD-10-CM

## 2019-10-14 DIAGNOSIS — E785 Hyperlipidemia, unspecified: Secondary | ICD-10-CM | POA: Diagnosis not present

## 2019-10-14 DIAGNOSIS — Z01810 Encounter for preprocedural cardiovascular examination: Secondary | ICD-10-CM | POA: Diagnosis not present

## 2019-10-14 MED ORDER — EZETIMIBE 10 MG PO TABS
10.0000 mg | ORAL_TABLET | Freq: Every day | ORAL | 3 refills | Status: AC
Start: 2019-10-14 — End: ?

## 2019-10-14 NOTE — Addendum Note (Signed)
Addended by: Vanessa Barbara on: 10/14/2019 12:23 PM   Modules accepted: Orders

## 2019-10-14 NOTE — Telephone Encounter (Signed)
Patient was unsure why our office called him, no message left, unable to assist him at this time.

## 2019-10-14 NOTE — Patient Instructions (Signed)
Medication Instructions:   START Zetia 10 mg daily  *If you need a refill on your cardiac medications before your next appointment, please call your pharmacy*  Lab Work: NONE ordered at this time of appointment   If you have labs (blood work) drawn today and your tests are completely normal, you will receive your results only by: [name] MyChart Message (if you have MyChart) OR . A paper copy in the mail If you have any lab test that is abnormal or we need to change your treatment, we will call you to review the results.  Testing/Procedures: NONE ordered at this time of appointment   Follow-Up: At [facility], you and your health needs are our priority.  As part of our continuing mission to provide you with exceptional heart care, we have created designated Provider Care Teams.  These Care Teams include your primary Cardiologist (physician) and Advanced Practice Providers (APPs -  Physician Assistants and Nurse Practitioners) who all work together to provide you with the care you need, when you need it.   Your next appointment:   6 month(s)  The format for your next appointment:   In Person  Provider:   [name], MD  Other Instructions

## 2019-10-15 NOTE — Progress Notes (Signed)
I do not think that he needs preop study.

## 2019-10-16 ENCOUNTER — Encounter: Payer: Self-pay | Admitting: Orthopaedic Surgery

## 2019-10-18 ENCOUNTER — Other Ambulatory Visit: Payer: Self-pay

## 2019-10-18 ENCOUNTER — Other Ambulatory Visit: Payer: Medicare Other

## 2019-10-18 ENCOUNTER — Ambulatory Visit
Admission: RE | Admit: 2019-10-18 | Discharge: 2019-10-18 | Disposition: A | Discharge status: Home / Self Care | Payer: Medicare Other | Source: Ambulatory Visit | Attending: Internal Medicine | Admitting: Internal Medicine

## 2019-10-18 DIAGNOSIS — J8410 Pulmonary fibrosis, unspecified: Secondary | ICD-10-CM | POA: Diagnosis not present

## 2019-10-18 DIAGNOSIS — J849 Interstitial pulmonary disease, unspecified: Secondary | ICD-10-CM

## 2019-10-18 DIAGNOSIS — Z01811 Encounter for preprocedural respiratory examination: Secondary | ICD-10-CM

## 2019-10-19 ENCOUNTER — Other Ambulatory Visit: Payer: Self-pay | Admitting: Internal Medicine

## 2019-10-19 DIAGNOSIS — R600 Localized edema: Secondary | ICD-10-CM

## 2019-10-20 ENCOUNTER — Encounter: Payer: Self-pay | Admitting: Orthopaedic Surgery

## 2019-10-20 ENCOUNTER — Other Ambulatory Visit: Payer: Self-pay

## 2019-10-20 ENCOUNTER — Ambulatory Visit (INDEPENDENT_AMBULATORY_CARE_PROVIDER_SITE_OTHER): Payer: Medicare Other | Admitting: Infectious Diseases

## 2019-10-20 VITALS — BP 101/61 | HR 80 | Wt 174.0 lb

## 2019-10-20 DIAGNOSIS — Z113 Encounter for screening for infections with a predominantly sexual mode of transmission: Secondary | ICD-10-CM | POA: Diagnosis not present

## 2019-10-20 DIAGNOSIS — I2510 Atherosclerotic heart disease of native coronary artery without angina pectoris: Secondary | ICD-10-CM

## 2019-10-20 DIAGNOSIS — I25708 Atherosclerosis of coronary artery bypass graft(s), unspecified, with other forms of angina pectoris: Secondary | ICD-10-CM

## 2019-10-20 DIAGNOSIS — Z21 Asymptomatic human immunodeficiency virus [HIV] infection status: Secondary | ICD-10-CM

## 2019-10-20 DIAGNOSIS — J849 Interstitial pulmonary disease, unspecified: Secondary | ICD-10-CM

## 2019-10-20 DIAGNOSIS — C9192 Lymphoid leukemia, unspecified, in relapse: Secondary | ICD-10-CM

## 2019-10-20 DIAGNOSIS — I700 Atherosclerosis of aorta: Secondary | ICD-10-CM

## 2019-10-20 DIAGNOSIS — C9112 Chronic lymphocytic leukemia of B-cell type in relapse: Secondary | ICD-10-CM

## 2019-10-20 NOTE — Assessment & Plan Note (Signed)
He is currently asx.  He has DOE- has had for some time, not changed. Most likely pulm fibrosis.  He has f/u with CV.

## 2019-10-20 NOTE — Progress Notes (Signed)
   Subjective:    Patient ID: [name], male    DOB: 1950-06-12, 69 y.o.   MRN: [id number]  HPI 69yo M with hx of HIV+, CLL(dx 2007), pulmonary fibrosis, and RA for which he takes MTX. He also has a hx of esophageal strictures dilated by EGD every 1.5 yrs.  06-2016 he underwent lumbar laminectomy.No problems. "Dr Saintclair Halsted did an outstanding job. I praise the ground he walks on". Sees Onc yearly- told that he is doing well,(Stage 0)continue surveillance. He was seen by Rheum-  onleflunamide. He has been seen neuro for his neuropathy (ART or virus induced).  He feels like this is about the same- feet get hot (doesn't like to wear shoes), feels like he is walking on bones.  He is currently on genvoya/darunavir.He has had no problems with his ART.   He had dislocation of his L THR (done 09-17-2018). He had revision of this 11-02-2018.  He is scheduled to have repeat hip surgery 11-03-19. (his 5th). He continues to have pain and groin swelling.   Had CT last week to f/u his ILD- explained to pt.   Has questions about onychomycosis- wants to avoid lamasil.   HIV 1 RNA Quant (copies/mL)  Date Value  08/04/2019 <20 NOT DETECTED  10/28/2018 <20 NOT DETECTED  01/07/2018 <20 DETECTED (A)   CD4 T Cell Abs (/uL)  Date Value  08/04/2019 1,255  10/28/2018 681  01/07/2018 1,220    Review of Systems  Constitutional: Negative for appetite change and unexpected weight change.  Gastrointestinal: Negative for constipation and diarrhea.  Genitourinary: Negative for difficulty urinating.  Musculoskeletal: Positive for arthralgias.  Neurological: Positive for numbness.  Psychiatric/Behavioral: Negative for sleep disturbance.  recently started on rx for his prostate- back to full stream and complete voiding. PSA was w/i nl per pt.  Please see HPI. All other systems reviewed and negative.     Objective:   Physical Exam Constitutional:      Appearance: Normal appearance.  HENT:      Mouth/Throat:     Mouth: Mucous membranes are moist.     Pharynx: No oropharyngeal exudate.  Eyes:     Extraocular Movements: Extraocular movements intact.     Pupils: Pupils are equal, round, and reactive to light.  Cardiovascular:     Rate and Rhythm: Normal rate and regular rhythm.  Pulmonary:     Effort: Pulmonary effort is normal.     Breath sounds: Normal breath sounds.  Abdominal:     General: Bowel sounds are normal. There is no distension.     Palpations: Abdomen is soft.     Tenderness: There is no abdominal tenderness.  Musculoskeletal:        General: Deformity present.     Cervical back: Normal range of motion and neck supple.     Right lower leg: Edema present.     Left lower leg: Edema present.  Neurological:     Mental Status: He is alert.  Psychiatric:        Mood and Affect: Mood normal.           Assessment & Plan:

## 2019-10-20 NOTE — Assessment & Plan Note (Signed)
Appreciate his h/o f/u.

## 2019-10-20 NOTE — Assessment & Plan Note (Signed)
Noted on CT Greatly appreciate CV f/u.

## 2019-10-20 NOTE — Assessment & Plan Note (Signed)
He is doing well on his current art- biktarvy-darunavir.  His other medial issues predominate Hopefully his hip surgery will go well.  He has gotten COVID vax I resolved his HIV2 dx as he does not have this.  Will see him back in 9 months.

## 2019-10-20 NOTE — Assessment & Plan Note (Signed)
Greatly appreciate pulmonary.  He has DOE- has had for some time, not changed.

## 2019-10-24 ENCOUNTER — Encounter: Payer: Self-pay | Admitting: Internal Medicine

## 2019-10-26 ENCOUNTER — Other Ambulatory Visit: Payer: Self-pay

## 2019-10-26 ENCOUNTER — Ambulatory Visit: Payer: Medicare Other | Admitting: Adult Health

## 2019-10-27 ENCOUNTER — Ambulatory Visit: Payer: Medicare Other | Admitting: Cardiology

## 2019-10-27 ENCOUNTER — Other Ambulatory Visit: Payer: Self-pay | Admitting: Infectious Diseases

## 2019-10-27 DIAGNOSIS — A6000 Herpesviral infection of urogenital system, unspecified: Secondary | ICD-10-CM

## 2019-10-31 ENCOUNTER — Other Ambulatory Visit: Payer: Medicare Other

## 2019-10-31 NOTE — Pre-Procedure Instructions (Signed)
[id number] [place], [place] - [place] [address] [place] [address] Phone: [phone] Fax: [phone]      Your procedure is scheduled on Thursday 11/03/19.  Report to [facility] Main Entrance "A" at 5:30 A.M., and check in at the Admitting office.  Call this number if you have problems the morning of surgery:  [phone]  Call [phone] if you have any questions prior to your surgery date Monday-Friday 8am-4pm    Remember:  Do not eat after midnight the night before your surgery  You may drink clear liquids until 4:30am the morning of your surgery.   Clear liquids allowed are: Water, Non-Citrus Juices (without pulp), Carbonated Beverages, Clear Tea, Black Coffee Only, and Gatorade   Enhanced Recovery after Surgery for Orthopedics Enhanced Recovery after Surgery is a protocol used to improve the stress on your body and your recovery after surgery.    Take these medicines the morning of surgery with A SIP OF WATER   bictegravir-emtricitabine-tenofovir AF (BIKTARVY) 50-200-25 MG TABS  darunavir-cobicistat (PREZCOBIX) 800-150 MG tablet  escitalopram (LEXAPRO) 20 MG tablet  leflunomide (ARAVA) 20 MG tablet   metoprolol succinate (TOPROL-XL) 50 MG 24 hr tablet  predniSONE (DELTASONE) 5 MG tablet  pregabalin (LYRICA) 200 MG capsule  sulfamethoxazole-trimethoprim (BACTRIM DS) 800-160 MG tablet   sulfaSALAzine (AZULFIDINE) 500 MG tablet   valACYclovir (VALTREX) 500 MG tablet   acetaminophen (TYLENOL) 500 MG as needed for pain  EPINEPHrine (EPIPEN 2-PAK) 0.3 mg/0.3 mL IJ SOAJ injection as needed for  Anaphylaxis  HYDROcodone-acetaminophen (NORCO) 10-325 MG tablet as needed for pain  nitroGLYCERIN (NITROSTAT) 0.4 MG SL tablet As needed for chest pain  ondansetron (ZOFRAN) 4 MG tablet       As of today, STOP taking any Aspirin (unless otherwise instructed by your surgeon) and Aspirin containing products, Aleve,  Naproxen, Ibuprofen, Motrin, Advil, Goody's, BC's, all herbal medications, fish oil, and all vitamins.                      Do not wear jewelry            Do not wear lotions, powders, colognes, or deodorant.            Men may shave face and neck.            Do not bring valuables to the hospital.            [facility] is not responsible for any belongings or valuables.  Do NOT Smoke (Tobacco/Vapping) or drink Alcohol 24 hours prior to your procedure If you use a CPAP at night, you may bring all equipment for your overnight stay.   Contacts, glasses, dentures or bridgework may not be worn into surgery.      For patients admitted to the hospital, discharge time will be determined by your treatment team.   Patients discharged the day of surgery will not be allowed to drive home, and someone needs to stay with them for 24 hours.    Special instructions:   [facility]- Preparing For Surgery  Before surgery, you can play an important role. Because skin is not sterile, your skin needs to be as free of germs as possible. You can reduce the number of germs on your skin by washing with CHG (chlorahexidine gluconate) Soap before surgery.  CHG is an antiseptic cleaner which kills germs and bonds with the skin to continue killing  germs even after washing.    Oral Hygiene is also important to reduce your risk of infection.  Remember - BRUSH YOUR TEETH THE MORNING OF SURGERY WITH YOUR REGULAR TOOTHPASTE  Please do not use if you have an allergy to CHG or antibacterial soaps. If your skin becomes reddened/irritated stop using the CHG.  Do not shave (including legs and underarms) for at least 48 hours prior to first CHG shower. It is OK to shave your face.  Please follow these instructions carefully.   1. Shower the NIGHT BEFORE SURGERY and the MORNING OF SURGERY with CHG Soap.   2. If you chose to wash your hair, wash your hair first as usual with your normal shampoo.  3. After you shampoo,  rinse your hair and body thoroughly to remove the shampoo.  4. Use CHG as you would any other liquid soap. You can apply CHG directly to the skin and wash gently with a scrungie or a clean washcloth.   5. Apply the CHG Soap to your body ONLY FROM THE NECK DOWN.  Do not use on open wounds or open sores. Avoid contact with your eyes, ears, mouth and genitals (private parts). Wash Face and genitals (private parts)  with your normal soap.   6. Wash thoroughly, paying special attention to the area where your surgery will be performed.  7. Thoroughly rinse your body with warm water from the neck down.  8. DO NOT shower/wash with your normal soap after using and rinsing off the CHG Soap.  9. Pat yourself dry with a CLEAN TOWEL.  10. Wear CLEAN PAJAMAS to bed the night before surgery, wear comfortable clothes the morning of surgery  11. Place CLEAN SHEETS on your bed the night of your first shower and DO NOT SLEEP WITH PETS.   Day of Surgery:   Do not apply any deodorants/lotions.  Please wear clean clothes to the hospital/surgery center.   Remember to brush your teeth WITH YOUR REGULAR TOOTHPASTE.   Please read over the following fact sheets that you were given.

## 2019-11-01 ENCOUNTER — Encounter (HOSPITAL_COMMUNITY)
Admission: RE | Admit: 2019-11-01 | Discharge: 2019-11-01 | Disposition: A | Discharge status: Home / Self Care | Payer: Medicare Other | Source: Ambulatory Visit | Attending: Orthopaedic Surgery | Admitting: Orthopaedic Surgery

## 2019-11-01 ENCOUNTER — Other Ambulatory Visit (HOSPITAL_COMMUNITY)
Admission: RE | Admit: 2019-11-01 | Discharge: 2019-11-01 | Disposition: A | Discharge status: Home / Self Care | Payer: Medicare Other | Source: Ambulatory Visit | Attending: Orthopaedic Surgery | Admitting: Orthopaedic Surgery

## 2019-11-01 ENCOUNTER — Encounter (HOSPITAL_COMMUNITY): Payer: Self-pay

## 2019-11-01 ENCOUNTER — Other Ambulatory Visit: Payer: Self-pay

## 2019-11-01 DIAGNOSIS — Z01818 Encounter for other preprocedural examination: Secondary | ICD-10-CM | POA: Insufficient documentation

## 2019-11-01 DIAGNOSIS — F329 Major depressive disorder, single episode, unspecified: Secondary | ICD-10-CM | POA: Insufficient documentation

## 2019-11-01 DIAGNOSIS — I10 Essential (primary) hypertension: Secondary | ICD-10-CM | POA: Insufficient documentation

## 2019-11-01 DIAGNOSIS — J8410 Pulmonary fibrosis, unspecified: Secondary | ICD-10-CM | POA: Insufficient documentation

## 2019-11-01 DIAGNOSIS — Z96651 Presence of right artificial knee joint: Secondary | ICD-10-CM | POA: Insufficient documentation

## 2019-11-01 DIAGNOSIS — T84091A Other mechanical complication of internal left hip prosthesis, initial encounter: Secondary | ICD-10-CM | POA: Insufficient documentation

## 2019-11-01 DIAGNOSIS — Z20822 Contact with and (suspected) exposure to covid-19: Secondary | ICD-10-CM | POA: Insufficient documentation

## 2019-11-01 DIAGNOSIS — F419 Anxiety disorder, unspecified: Secondary | ICD-10-CM | POA: Insufficient documentation

## 2019-11-01 DIAGNOSIS — Z21 Asymptomatic human immunodeficiency virus [HIV] infection status: Secondary | ICD-10-CM | POA: Insufficient documentation

## 2019-11-01 DIAGNOSIS — Z86718 Personal history of other venous thrombosis and embolism: Secondary | ICD-10-CM | POA: Insufficient documentation

## 2019-11-01 DIAGNOSIS — Z856 Personal history of leukemia: Secondary | ICD-10-CM | POA: Insufficient documentation

## 2019-11-01 DIAGNOSIS — M069 Rheumatoid arthritis, unspecified: Secondary | ICD-10-CM | POA: Insufficient documentation

## 2019-11-01 DIAGNOSIS — Z8673 Personal history of transient ischemic attack (TIA), and cerebral infarction without residual deficits: Secondary | ICD-10-CM | POA: Insufficient documentation

## 2019-11-01 DIAGNOSIS — Y839 Surgical procedure, unspecified as the cause of abnormal reaction of the patient, or of later complication, without mention of misadventure at the time of the procedure: Secondary | ICD-10-CM | POA: Insufficient documentation

## 2019-11-01 DIAGNOSIS — I2510 Atherosclerotic heart disease of native coronary artery without angina pectoris: Secondary | ICD-10-CM | POA: Insufficient documentation

## 2019-11-01 DIAGNOSIS — Z951 Presence of aortocoronary bypass graft: Secondary | ICD-10-CM | POA: Insufficient documentation

## 2019-11-01 DIAGNOSIS — Z79899 Other long term (current) drug therapy: Secondary | ICD-10-CM | POA: Insufficient documentation

## 2019-11-01 LAB — SARS CORONAVIRUS 2 (TAT 6-24 HRS): SARS Coronavirus 2: NEGATIVE

## 2019-11-01 LAB — BASIC METABOLIC PANEL
Anion gap: 11 (ref 5–15)
BUN: 20 mg/dL (ref 8–23)
CO2: 28 mmol/L (ref 22–32)
Calcium: 8.8 mg/dL — ABNORMAL LOW (ref 8.9–10.3)
Chloride: 105 mmol/L (ref 98–111)
Creatinine, Ser: 0.99 mg/dL (ref 0.61–1.24)
GFR calc Af Amer: >60 mL/min (ref >60)
GFR calc non Af Amer: >60 mL/min (ref >60)
Glucose, Bld: 97 mg/dL (ref 70–99)
Potassium: 3.6 mmol/L (ref 3.5–5.1)
Sodium: 144 mmol/L (ref 135–145)

## 2019-11-01 LAB — CBC
HCT: 41 % (ref 39.0–52.0)
Hemoglobin: 12.7 g/dL — ABNORMAL LOW (ref 13.0–17.0)
MCH: 36.7 pg — ABNORMAL HIGH (ref 26.0–34.0)
MCHC: 31 g/dL (ref 30.0–36.0)
MCV: 118.5 fL — ABNORMAL HIGH (ref 80.0–100.0)
Platelets: 123 10*3/uL — ABNORMAL LOW (ref 150–400)
RBC: 3.46 MIL/uL — ABNORMAL LOW (ref 4.22–5.81)
RDW: 15.2 % (ref 11.5–15.5)
WBC: 12.4 10*3/uL — ABNORMAL HIGH (ref 4.0–10.5)
nRBC: 0 % (ref 0.0–0.2)

## 2019-11-01 LAB — SURGICAL PCR SCREEN
MRSA, PCR: NEGATIVE
Staphylococcus aureus: NEGATIVE

## 2019-11-01 NOTE — Progress Notes (Addendum)
PCP - Dr. [name] with [facility] on [facility] Cardiologist - Dr. Alba Cory on [facility] Per patient - Pulmonologist Ramaswamy just cleared him for surgery. Had recent sleep study 2 weeks ago awaiting results  Chest x-ray - Not indicated EKG - 10/14/19 Stress Test - Years ago  ECHO - July 2020 Cardiac Cath - Cabg with stents in 2010  Sleep Study - Recently  DM - Denies  Blood Thinner Instructions: advised to stop Savaysa on 10/31/19 Last dose on the 20th  ERAS Protcol - Given PRE-SURGERY Ensure or G2- ENsure given  COVID TEST- 22nd   Anesthesia review: Cardiac hx  Patient denies shortness of breath, fever, cough and chest pain at PAT appointment   All instructions explained to the patient, with a verbal understanding of the material. Patient agrees to go over the instructions while at home for a better understanding. Patient also instructed to self quarantine after being tested for COVID-19. The opportunity to ask questions was provided.

## 2019-11-01 NOTE — Anesthesia Preprocedure Evaluation (Addendum)
Anesthesia Evaluation  Patient identified by MRN, date of birth, ID band Patient awake    Reviewed: Allergy & Precautions, H&P , NPO status , Patient's Chart, lab work & pertinent test results  History of Anesthesia Complications (+) PONV and history of anesthetic complications  Airway Mallampati: II   Neck ROM: full    Dental   Pulmonary  Pulmonary fibrosis. Cleared by pulmonologist.   breath sounds clear to auscultation       Cardiovascular hypertension, + CAD, + Past MI, + Cardiac Stents, + CABG and + DVT   Rhythm:regular Rate:Normal     Neuro/Psych PSYCHIATRIC DISORDERS Anxiety Depression  Neuromuscular disease CVA    GI/Hepatic hiatal hernia, PUD, GERD  ,  Endo/Other  diabetes  Renal/GU      Musculoskeletal  (+) Arthritis , Fibromyalgia -  Abdominal   Peds  Hematology  (+) HIV,   Anesthesia Other Findings   Reproductive/Obstetrics                            Anesthesia Physical Anesthesia Plan  ASA: III  Anesthesia Plan: General   Post-op Pain Management:    Induction: Intravenous  PONV Risk Score and Plan: 3 and Ondansetron, Dexamethasone and Treatment may vary due to age or medical condition  Airway Management Planned: Oral ETT  Additional Equipment:   Intra-op Plan:   Post-operative Plan: Extubation in OR  Informed Consent: I have reviewed the patients History and Physical, chart, labs and discussed the procedure including the risks, benefits and alternatives for the proposed anesthesia with the patient or authorized representative who has indicated his/her understanding and acceptance.       Plan Discussed with: CRNA, Anesthesiologist and Surgeon  Anesthesia Plan Comments: (See APP note by Durel Salts, FNP)       Anesthesia Quick Evaluation

## 2019-11-01 NOTE — Progress Notes (Signed)
Anesthesia Chart Review:   Case: [id number] Date/Time: 11/03/19 0715   Procedure: REVISION LEFT HIP ACETABULAR COMPONENT (Left Hip)   Anesthesia type: Choice   Pre-op diagnosis: failed left hip acetabular component   Location: [facility] OR ROOM 08 / [name] OR   Surgeons: Mcarthur Rossetti, MD      DISCUSSION:  Pt is a 69 year old with hx CAD (BMS to LAD 2010; s/p CABG 2010), PSVT, mild aortic stenosis, carotid artery disease, Interstitial lung disease, pulmonary fibrosis, HTN, TIA, DVT (on chronic anticoagulation), CLL, HIV, RA  - Pt cleared by cardiology 10/14/19 at acceptable risk  - Pt cleared by pulmonology 06/14/95: "Overall complications such as prolonged mechanical ventilation or ventilator dependence after hip surgery is low moderate. Likewise risk for any pulmonary complications such as pneumonia or heart failure or oxygen requirement is moderate. Risk can be significantly lowered if duration of surgery is less than 2 hours"   - Last dose Savaysa 10/30/19    VS: BP 119/71   Pulse 81   Temp 37 C (Oral)   Resp 18   Ht 5\' 9"  (1.753 m)   Wt 78.7 kg   SpO2 97%   BMI 25.64 kg/m    PROVIDERS: - PCP is [name], MD  - Cardiologist is [name], MD. Last office visit 10/14/19 with Roby Lofts, PA who cleared pt for surgery at acceptable risk - Pulmonologist is Brand Males, MD - ID is Bobby Rumpf, MD - Hem-Onc is Zola Button, MD. Last office visit 08/16/19 - Rheumatologist is [name]   LABS: Labs reviewed: Acceptable for surgery. (all labs ordered are listed, but only abnormal results are displayed)  Labs Reviewed  CBC - Abnormal; Notable for the following components:      Result Value   WBC 12.4 (*)    RBC 3.46 (*)    Hemoglobin 12.7 (*)    MCV 118.5 (*)    MCH 36.7 (*)    Platelets 123 (*)    All other components within normal limits  BASIC METABOLIC PANEL - Abnormal; Notable for the following components:   Calcium 8.8 (*)    All other components  within normal limits  SURGICAL PCR SCREEN     IMAGES:  CT chest high res 10/19/19:  1. Basilar predominant pattern of subpleural fibrosis appears more organized and progressive from prior exams and may be due to usual interstitial pneumonitis or fibrotic nonspecific interstitial pneumonitis. Findings are indeterminate for UIP per consensus guidelines 2. Small left fibrothorax with adjacent scarring and volume loss in the left lower lobe. 3.  Aortic atherosclerosis    EKG 10/14/19: NSR   CV:  Cardiac event monitor 06/22/19:  - Predominant rhythm is normal sinus - Runs of SVT with the longest run lasting 18.4 seconds.   - Frequent isolated supraventricular events.  - Frequent ventricular ectopy with rare bigeminy and trigeminy   Echocardiogram 11/2018: 1. The left ventricle has normal systolic function with an ejection fraction of 60-65%. The cavity size was normal. Left ventricular diastolic Doppler parameters are consistent with impaired relaxation.  2. The right ventricle has normal systolic function. The cavity was mildly enlarged. There is no increase in right ventricular wall thickness.  3. Right atrial size was mildly dilated.  4. The aortic valve is tricuspid. Mild thickening of the aortic valve. Moderate calcification of the aortic valve. Aortic valve regurgitation is mild by color flow Doppler. Mild stenosis of the aortic valve.  5. Peak aortic velocity: 2.28m/s,  mean gradient 66mmHg.  6. The aorta is normal in size and structure.    Carotid duplex 12/25/16:  - Heterogeneous plaque, bilaterally. - Stable 1-39% bilateral ICA stenosis. - Normal subclavian arteries, bilaterally. - Patent vertebral arteries with antegrade flow   NST 2016: - Intermediate risk stress nuclear study with moderate-size and intensity, fixed apical/septal defect suggestive of scar. No reversible ischemia. - LV Wall Motion: Septal hypokinesis/dyskineis and apical akinesis, EF 50%   Past Medical  History:  Diagnosis Date  . Allergy   . Anxiety   . Carotid artery occlusion    40-60% right ICA stenosis (09/2008)  . Cataract   . Chronic back pain   . CLL (chronic lymphoblastic leukemia) dx 2010   Followed at [facility] q43mo, no current therapy   . Clotting disorder ([place])   . Coronary artery disease 2010   s/p CABG '10, sees Dr. Percival Spanish  . Depression   . Diverticulosis   . DVT, lower extremity, recurrent ([place]) 2008, 2009   LLE, chronic anticoag since 2009  . Esophagitis   . Fibromyalgia   . Gallstones   . GERD (gastroesophageal reflux disease)   . Gout   . Gynecomastia, male   . H/O hiatal hernia 2008   surgery  . Hemorrhoids   . Hepatitis A yrs ago  . HIV infection ([place]) dx 1993  . Hypertension   . Impotence of organic origin   . Myocardial infarction ([place]) 2010    x 2  . Neuromuscular disorder ([facility])    neuropathy  . Osteoarthritis, knee    s/p B TKA  . Osteoporosis   . PONV (postoperative nausea and vomiting)   . Pulmonary fibrosis ([place]) 2017  . Rheumatoid arthritis(714.0) dx 2010   MTX, follows with rheum  . Seasonal allergies   . Secondary syphilis 07/24/14 dx   s/p 2 wks doxy  . Status post dilation of esophageal narrowing   . Stroke [facility]) 1969   TIA  . TIA (transient ischemic attack) 1997   mild residual L mouth droop  . Tubular adenoma of colon     Past Surgical History:  Procedure Laterality Date  . ANTERIOR HIP REVISION Left 08/17/2018   Procedure: ANTERIOR LEFT HIP REVISION;  Surgeon: Mcarthur Rossetti, MD;  Location: [place];  Service: Orthopedics;  Laterality: Left;  . ANTERIOR HIP REVISION Left 11/02/2018   Procedure: LEFT HIP CONSTRAINED LINER REVISION;  Surgeon: Mcarthur Rossetti, MD;  Location: [place];  Service: Orthopedics;  Laterality: Left;  . BACK SURGERY  2010 & 2017  . CHOLECYSTECTOMY    . COLONOSCOPY WITH PROPOFOL N/A 12/28/2012   Procedure: COLONOSCOPY WITH PROPOFOL;  Surgeon: Jerene Bears, MD;  Location: [facility];  Service:  Gastroenterology;  Laterality: N/A;  . CORONARY ARTERY BYPASS GRAFT  2010   triple bypass  . ESOPHAGOGASTRODUODENOSCOPY (EGD) WITH PROPOFOL N/A 12/28/2012   Procedure: ESOPHAGOGASTRODUODENOSCOPY (EGD) WITH PROPOFOL;  Surgeon: Jerene Bears, MD;  [facility]: [facility];  Service: Gastroenterology;  Laterality: N/A;  . ESOPHAGOGASTRODUODENOSCOPY (EGD) WITH PROPOFOL N/A 03/15/2013   Procedure: ESOPHAGOGASTRODUODENOSCOPY (EGD) WITH PROPOFOL;  Surgeon: Jerene Bears, MD;  [facility];  Service: Gastroenterology;  Laterality: N/A;  . ESOPHAGOGASTRODUODENOSCOPY (EGD) WITH PROPOFOL N/A 02/07/2016   Procedure: ESOPHAGOGASTRODUODENOSCOPY (EGD) WITH PROPOFOL;  Surgeon: Jerene Bears, MD;  [facility];  Service: Gastroenterology;  Laterality: N/A;  . HARDWARE REMOVAL N/A 07/02/2012   Procedure: HARDWARE REMOVAL;  Surgeon: Elaina Hoops, MD;  [facility];  Service: Neurosurgery;  Laterality: N/A;  . HIATAL HERNIA REPAIR     wrap  . HIP CLOSED REDUCTION Left 08/15/2018   Procedure: CLOSED REDUCTION HIP;  Surgeon: Newt Minion, MD;  Location: [place];  Service: Orthopedics;  Laterality: Left;  . HIP CLOSED REDUCTION Left 08/28/2018   Procedure: CLOSED REDUCTION HIP FOR RECURRENT DISLOCATION AND DRESSING CHANGE;  Surgeon: Jessy Oto, MD;  Location: [place];  Service: Orthopedics;  Laterality: Left;  . HIP CLOSED REDUCTION Left 09/15/2018   Procedure: CLOSED REDUCTION HIP DISLOCATION;  Surgeon: Mcarthur Rossetti, MD;  Location: [place];  Service: Orthopedics;  Laterality: Left;  . HIP CLOSED REDUCTION Left 10/31/2018   Procedure: CLOSED REDUCTION LEFT TOTAL HIP;  Surgeon: Leandrew Koyanagi, MD;  Location: [place];  Service: Orthopedics;  Laterality: Left;  . INGUINAL HERNIA REPAIR Bilateral   . JOINT REPLACEMENT Left 1999  . KNEE ARTHROPLASTY  07/22/2011   Procedure: COMPUTER ASSISTED TOTAL KNEE ARTHROPLASTY;  Surgeon: Meredith Pel, MD;  Location: [place];  Service: Orthopedics;   Laterality: Right;  Right total knee arthroplasty  . MANDIBLE SURGERY Bilateral    tmj  . REPLACEMENT TOTAL KNEE Bilateral   . ring around testicle hernia reapir  184 and 1986   x 2  . ROTATOR CUFF REPAIR Right   . SHOULDER SURGERY Left   . SPINE SURGERY  2010   "rod and screws", "failed", lopwer spine,   . stent to heart x 1  2010  . TONSILLECTOMY    . TOTAL HIP ARTHROPLASTY Left 08/13/2018   Procedure: LEFT TOTAL HIP ARTHROPLASTY ANTERIOR APPROACH;  Surgeon: Mcarthur Rossetti, MD;  Location: [place];  Service: Orthopedics;  Laterality: Left;  . TOTAL HIP ARTHROPLASTY Left 09/17/2018   Procedure: ANTERIOR LEFT HIP REVISION;  Surgeon: Mcarthur Rossetti, MD;  Location: [place];  Service: Orthopedics;  Laterality: Left;  . UMBILICAL HERNIA REPAIR     x 1  . varicose vein     stripping  . VIDEO BRONCHOSCOPY Bilateral 10/16/2015   Procedure: VIDEO BRONCHOSCOPY WITHOUT FLUORO;  Surgeon: Brand Males, MD;  [facility];  Service: Cardiopulmonary;  Laterality: Bilateral;    MEDICATIONS: . acetaminophen (TYLENOL) 500 MG tablet  . atorvastatin (LIPITOR) 10 MG tablet  . bictegravir-emtricitabine-tenofovir AF (BIKTARVY) 50-200-25 MG TABS tablet  . clindamycin (CLEOCIN) 300 MG capsule  . clotrimazole-betamethasone (LOTRISONE) cream  . darunavir-cobicistat (PREZCOBIX) 800-150 MG tablet  . EPINEPHrine (EPIPEN 2-PAK) 0.3 mg/0.3 mL IJ SOAJ injection  . escitalopram (LEXAPRO) 20 MG tablet  . ezetimibe (ZETIA) 10 MG tablet  . famotidine (PEPCID) 40 MG tablet  . folic acid (FOLVITE) 1 MG tablet  . HYDROcodone-acetaminophen (NORCO) 10-325 MG tablet  . l-methylfolate-B6-B12 (METANX) 3-35-2 MG TABS tablet  . leflunomide (ARAVA) 20 MG tablet  . metoprolol succinate (TOPROL-XL) 50 MG 24 hr tablet  . nitroGLYCERIN (NITROSTAT) 0.4 MG SL tablet  . ondansetron (ZOFRAN) 4 MG tablet  . predniSONE (DELTASONE) 5 MG tablet  . pregabalin (LYRICA) 200 MG capsule  . SAVAYSA 30 MG TABS tablet   . sulfamethoxazole-trimethoprim (BACTRIM DS) 800-160 MG tablet  . sulfaSALAzine (AZULFIDINE) 500 MG tablet  . tamsulosin (FLOMAX) 0.4 MG CAPS capsule  . torsemide (DEMADEX) 20 MG tablet  . valACYclovir (VALTREX) 500 MG tablet   No current facility-administered medications for this encounter.   - Last dose Savaysa 10/30/19   If no changes, I anticipate pt can proceed with surgery as scheduled.   Willeen Cass, FNP-BC [facility]/Anesthesiology Phone: [phone] 11/01/2019 2:13 PM

## 2019-11-03 ENCOUNTER — Inpatient Hospital Stay (HOSPITAL_COMMUNITY)
Admission: RE | Admit: 2019-11-03 | Discharge: 2019-11-10 | DRG: 467 | Disposition: A | Discharge status: Rehabilitation Facility | Payer: Medicare Other | Attending: Orthopaedic Surgery | Admitting: Orthopaedic Surgery

## 2019-11-03 ENCOUNTER — Ambulatory Visit (HOSPITAL_COMMUNITY): Payer: Medicare Other | Admitting: Occupational Therapy

## 2019-11-03 ENCOUNTER — Inpatient Hospital Stay (HOSPITAL_COMMUNITY): Payer: Medicare Other | Admitting: Certified Registered Nurse Anesthetist

## 2019-11-03 ENCOUNTER — Encounter (HOSPITAL_COMMUNITY): Payer: Self-pay | Admitting: Orthopaedic Surgery

## 2019-11-03 ENCOUNTER — Encounter (HOSPITAL_COMMUNITY)
Admission: RE | Disposition: A | Discharge status: Rehabilitation Facility | Payer: Self-pay | Source: Home / Self Care | Attending: Orthopaedic Surgery

## 2019-11-03 ENCOUNTER — Inpatient Hospital Stay (HOSPITAL_COMMUNITY): Payer: Medicare Other | Admitting: Emergency Medicine

## 2019-11-03 ENCOUNTER — Inpatient Hospital Stay (HOSPITAL_COMMUNITY): Payer: Medicare Other

## 2019-11-03 ENCOUNTER — Other Ambulatory Visit: Payer: Self-pay

## 2019-11-03 DIAGNOSIS — T84020A Dislocation of internal right hip prosthesis, initial encounter: Secondary | ICD-10-CM | POA: Diagnosis present

## 2019-11-03 DIAGNOSIS — M009 Pyogenic arthritis, unspecified: Secondary | ICD-10-CM | POA: Diagnosis not present

## 2019-11-03 DIAGNOSIS — Z91048 Other nonmedicinal substance allergy status: Secondary | ICD-10-CM

## 2019-11-03 DIAGNOSIS — M549 Dorsalgia, unspecified: Secondary | ICD-10-CM | POA: Diagnosis present

## 2019-11-03 DIAGNOSIS — Z96653 Presence of artificial knee joint, bilateral: Secondary | ICD-10-CM | POA: Diagnosis present

## 2019-11-03 DIAGNOSIS — C9112 Chronic lymphocytic leukemia of B-cell type in relapse: Secondary | ICD-10-CM | POA: Diagnosis present

## 2019-11-03 DIAGNOSIS — J849 Interstitial pulmonary disease, unspecified: Secondary | ICD-10-CM | POA: Diagnosis present

## 2019-11-03 DIAGNOSIS — T84031A Mechanical loosening of internal left hip prosthetic joint, initial encounter: Secondary | ICD-10-CM | POA: Diagnosis present

## 2019-11-03 DIAGNOSIS — Z79899 Other long term (current) drug therapy: Secondary | ICD-10-CM

## 2019-11-03 DIAGNOSIS — N400 Enlarged prostate without lower urinary tract symptoms: Secondary | ICD-10-CM | POA: Diagnosis present

## 2019-11-03 DIAGNOSIS — T84018D Broken internal joint prosthesis, other site, subsequent encounter: Secondary | ICD-10-CM

## 2019-11-03 DIAGNOSIS — M109 Gout, unspecified: Secondary | ICD-10-CM | POA: Diagnosis present

## 2019-11-03 DIAGNOSIS — I959 Hypotension, unspecified: Secondary | ICD-10-CM | POA: Diagnosis present

## 2019-11-03 DIAGNOSIS — Z86718 Personal history of other venous thrombosis and embolism: Secondary | ICD-10-CM

## 2019-11-03 DIAGNOSIS — E1136 Type 2 diabetes mellitus with diabetic cataract: Secondary | ICD-10-CM | POA: Diagnosis present

## 2019-11-03 DIAGNOSIS — G8929 Other chronic pain: Secondary | ICD-10-CM | POA: Diagnosis present

## 2019-11-03 DIAGNOSIS — Z885 Allergy status to narcotic agent status: Secondary | ICD-10-CM

## 2019-11-03 DIAGNOSIS — Z856 Personal history of leukemia: Secondary | ICD-10-CM | POA: Diagnosis not present

## 2019-11-03 DIAGNOSIS — Z880 Allergy status to penicillin: Secondary | ICD-10-CM

## 2019-11-03 DIAGNOSIS — Z96642 Presence of left artificial hip joint: Secondary | ICD-10-CM

## 2019-11-03 DIAGNOSIS — A6000 Herpesviral infection of urogenital system, unspecified: Secondary | ICD-10-CM | POA: Diagnosis present

## 2019-11-03 DIAGNOSIS — I252 Old myocardial infarction: Secondary | ICD-10-CM | POA: Diagnosis not present

## 2019-11-03 DIAGNOSIS — Z20822 Contact with and (suspected) exposure to covid-19: Secondary | ICD-10-CM | POA: Diagnosis present

## 2019-11-03 DIAGNOSIS — K2100 Gastro-esophageal reflux disease with esophagitis, without bleeding: Secondary | ICD-10-CM | POA: Diagnosis present

## 2019-11-03 DIAGNOSIS — I25810 Atherosclerosis of coronary artery bypass graft(s) without angina pectoris: Secondary | ICD-10-CM | POA: Diagnosis present

## 2019-11-03 DIAGNOSIS — Z803 Family history of malignant neoplasm of breast: Secondary | ICD-10-CM | POA: Diagnosis not present

## 2019-11-03 DIAGNOSIS — B952 Enterococcus as the cause of diseases classified elsewhere: Secondary | ICD-10-CM | POA: Diagnosis present

## 2019-11-03 DIAGNOSIS — I119 Hypertensive heart disease without heart failure: Secondary | ICD-10-CM | POA: Diagnosis present

## 2019-11-03 DIAGNOSIS — M069 Rheumatoid arthritis, unspecified: Secondary | ICD-10-CM | POA: Diagnosis present

## 2019-11-03 DIAGNOSIS — M25552 Pain in left hip: Secondary | ICD-10-CM | POA: Diagnosis present

## 2019-11-03 DIAGNOSIS — T84011A Broken internal left hip prosthesis, initial encounter: Secondary | ICD-10-CM | POA: Diagnosis not present

## 2019-11-03 DIAGNOSIS — S32402S Unspecified fracture of left acetabulum, sequela: Secondary | ICD-10-CM | POA: Diagnosis not present

## 2019-11-03 DIAGNOSIS — E785 Hyperlipidemia, unspecified: Secondary | ICD-10-CM | POA: Diagnosis present

## 2019-11-03 DIAGNOSIS — F419 Anxiety disorder, unspecified: Secondary | ICD-10-CM | POA: Diagnosis present

## 2019-11-03 DIAGNOSIS — I10 Essential (primary) hypertension: Secondary | ICD-10-CM | POA: Diagnosis present

## 2019-11-03 DIAGNOSIS — I69392 Facial weakness following cerebral infarction: Secondary | ICD-10-CM | POA: Diagnosis not present

## 2019-11-03 DIAGNOSIS — T8452XD Infection and inflammatory reaction due to internal left hip prosthesis, subsequent encounter: Secondary | ICD-10-CM | POA: Diagnosis not present

## 2019-11-03 DIAGNOSIS — Z8673 Personal history of transient ischemic attack (TIA), and cerebral infarction without residual deficits: Secondary | ICD-10-CM

## 2019-11-03 DIAGNOSIS — Z471 Aftercare following joint replacement surgery: Secondary | ICD-10-CM | POA: Diagnosis present

## 2019-11-03 DIAGNOSIS — Z6825 Body mass index (BMI) 25.0-25.9, adult: Secondary | ICD-10-CM

## 2019-11-03 DIAGNOSIS — R079 Chest pain, unspecified: Secondary | ICD-10-CM

## 2019-11-03 DIAGNOSIS — R64 Cachexia: Secondary | ICD-10-CM | POA: Diagnosis present

## 2019-11-03 DIAGNOSIS — I6529 Occlusion and stenosis of unspecified carotid artery: Secondary | ICD-10-CM | POA: Diagnosis present

## 2019-11-03 DIAGNOSIS — W19XXXA Unspecified fall, initial encounter: Secondary | ICD-10-CM | POA: Diagnosis present

## 2019-11-03 DIAGNOSIS — T8452XA Infection and inflammatory reaction due to internal left hip prosthesis, initial encounter: Principal | ICD-10-CM | POA: Diagnosis present

## 2019-11-03 DIAGNOSIS — D62 Acute posthemorrhagic anemia: Secondary | ICD-10-CM | POA: Diagnosis present

## 2019-11-03 DIAGNOSIS — Z8249 Family history of ischemic heart disease and other diseases of the circulatory system: Secondary | ICD-10-CM

## 2019-11-03 DIAGNOSIS — Z419 Encounter for procedure for purposes other than remedying health state, unspecified: Secondary | ICD-10-CM

## 2019-11-03 DIAGNOSIS — Z96649 Presence of unspecified artificial hip joint: Secondary | ICD-10-CM

## 2019-11-03 DIAGNOSIS — Z91010 Allergy to peanuts: Secondary | ICD-10-CM

## 2019-11-03 DIAGNOSIS — E1142 Type 2 diabetes mellitus with diabetic polyneuropathy: Secondary | ICD-10-CM | POA: Diagnosis present

## 2019-11-03 DIAGNOSIS — Z951 Presence of aortocoronary bypass graft: Secondary | ICD-10-CM | POA: Diagnosis not present

## 2019-11-03 DIAGNOSIS — Z9049 Acquired absence of other specified parts of digestive tract: Secondary | ICD-10-CM

## 2019-11-03 DIAGNOSIS — I2510 Atherosclerotic heart disease of native coronary artery without angina pectoris: Secondary | ICD-10-CM | POA: Diagnosis present

## 2019-11-03 DIAGNOSIS — Z981 Arthrodesis status: Secondary | ICD-10-CM

## 2019-11-03 DIAGNOSIS — K219 Gastro-esophageal reflux disease without esophagitis: Secondary | ICD-10-CM | POA: Diagnosis present

## 2019-11-03 DIAGNOSIS — Z21 Asymptomatic human immunodeficiency virus [HIV] infection status: Secondary | ICD-10-CM | POA: Diagnosis present

## 2019-11-03 DIAGNOSIS — Z888 Allergy status to other drugs, medicaments and biological substances status: Secondary | ICD-10-CM

## 2019-11-03 DIAGNOSIS — M40209 Unspecified kyphosis, site unspecified: Secondary | ICD-10-CM | POA: Diagnosis present

## 2019-11-03 DIAGNOSIS — M810 Age-related osteoporosis without current pathological fracture: Secondary | ICD-10-CM | POA: Diagnosis present

## 2019-11-03 DIAGNOSIS — Z7901 Long term (current) use of anticoagulants: Secondary | ICD-10-CM

## 2019-11-03 DIAGNOSIS — B20 Human immunodeficiency virus [HIV] disease: Secondary | ICD-10-CM | POA: Diagnosis present

## 2019-11-03 DIAGNOSIS — Z7952 Long term (current) use of systemic steroids: Secondary | ICD-10-CM

## 2019-11-03 DIAGNOSIS — Z91018 Allergy to other foods: Secondary | ICD-10-CM

## 2019-11-03 DIAGNOSIS — M797 Fibromyalgia: Secondary | ICD-10-CM | POA: Diagnosis present

## 2019-11-03 DIAGNOSIS — J8410 Pulmonary fibrosis, unspecified: Secondary | ICD-10-CM | POA: Diagnosis present

## 2019-11-03 DIAGNOSIS — Z96651 Presence of right artificial knee joint: Secondary | ICD-10-CM | POA: Diagnosis present

## 2019-11-03 HISTORY — PX: ACETABULAR REVISION: SHX5712

## 2019-11-03 HISTORY — PX: ANTERIOR HIP REVISION: SHX6527

## 2019-11-03 LAB — GLUCOSE, CAPILLARY: Glucose-Capillary: 138 mg/dL — ABNORMAL HIGH (ref 70–99)

## 2019-11-03 SURGERY — REVISION, TOTAL ARTHROPLASTY, HIP, ANTERIOR APPROACH
Anesthesia: General | Site: Hip | Laterality: Left

## 2019-11-03 MED ORDER — LEFLUNOMIDE 20 MG PO TABS
20.0000 mg | ORAL_TABLET | Freq: Every day | ORAL | Status: DC
  Administered 2019-11-04 – 2019-11-10 (×7): 20 mg via ORAL
  Filled 2019-11-03 (×7): qty 1

## 2019-11-03 MED ORDER — PREGABALIN 100 MG PO CAPS
200.0000 mg | ORAL_CAPSULE | Freq: Two times a day (BID) | ORAL | Status: DC
  Administered 2019-11-03 – 2019-11-10 (×14): 200 mg via ORAL
  Filled 2019-11-03 (×14): qty 2

## 2019-11-03 MED ORDER — DOCUSATE SODIUM 100 MG PO CAPS
100.0000 mg | ORAL_CAPSULE | Freq: Two times a day (BID) | ORAL | Status: DC
  Administered 2019-11-03 – 2019-11-10 (×12): 100 mg via ORAL
  Filled 2019-11-03 (×14): qty 1

## 2019-11-03 MED ORDER — ROCURONIUM BROMIDE 10 MG/ML (PF) SYRINGE
PREFILLED_SYRINGE | INTRAVENOUS | Status: DC | PRN
  Administered 2019-11-03: 10 mg via INTRAVENOUS
  Administered 2019-11-03: 50 mg via INTRAVENOUS
  Administered 2019-11-03: 10 mg via INTRAVENOUS

## 2019-11-03 MED ORDER — METHOCARBAMOL 1000 MG/10ML IJ SOLN
500.0000 mg | Freq: Four times a day (QID) | INTRAVENOUS | Status: DC | PRN
  Filled 2019-11-03: qty 5

## 2019-11-03 MED ORDER — ONDANSETRON HCL 4 MG/2ML IJ SOLN: 4.0000 mg | Freq: Four times a day (QID) | INTRAMUSCULAR | Status: DC | PRN

## 2019-11-03 MED ORDER — FENTANYL CITRATE (PF) 100 MCG/2ML IJ SOLN
25.0000 ug | INTRAMUSCULAR | Status: DC | PRN
  Administered 2019-11-03 (×3): 50 ug via INTRAVENOUS

## 2019-11-03 MED ORDER — LIDOCAINE 2% (20 MG/ML) 5 ML SYRINGE
INTRAMUSCULAR | Status: AC
  Filled 2019-11-03: qty 5

## 2019-11-03 MED ORDER — 0.9 % SODIUM CHLORIDE (POUR BTL) OPTIME
TOPICAL | Status: DC | PRN
  Administered 2019-11-03: 1000 mL

## 2019-11-03 MED ORDER — OXYCODONE HCL 5 MG PO TABS
10.0000 mg | ORAL_TABLET | ORAL | Status: DC | PRN
  Administered 2019-11-04: 10 mg via ORAL
  Administered 2019-11-09: 15 mg via ORAL
  Filled 2019-11-03 (×2): qty 3

## 2019-11-03 MED ORDER — STERILE WATER FOR IRRIGATION IR SOLN
Status: DC | PRN
  Administered 2019-11-03: 1000 mL

## 2019-11-03 MED ORDER — OXYCODONE HCL 5 MG PO TABS
5.0000 mg | ORAL_TABLET | Freq: Once | ORAL | Status: AC | PRN
  Administered 2019-11-03: 5 mg via ORAL

## 2019-11-03 MED ORDER — LIDOCAINE HCL (CARDIAC) PF 100 MG/5ML IV SOSY
PREFILLED_SYRINGE | INTRAVENOUS | Status: DC | PRN
  Administered 2019-11-03: 60 mg via INTRAVENOUS

## 2019-11-03 MED ORDER — EZETIMIBE 10 MG PO TABS
10.0000 mg | ORAL_TABLET | Freq: Every day | ORAL | Status: DC
  Administered 2019-11-03 – 2019-11-09 (×7): 10 mg via ORAL
  Filled 2019-11-03 (×7): qty 1

## 2019-11-03 MED ORDER — ONDANSETRON HCL 4 MG/2ML IJ SOLN
INTRAMUSCULAR | Status: DC | PRN
  Administered 2019-11-03: 4 mg via INTRAVENOUS

## 2019-11-03 MED ORDER — OXYCODONE HCL 5 MG/5ML PO SOLN: 5.0000 mg | Freq: Once | ORAL | Status: AC | PRN

## 2019-11-03 MED ORDER — SODIUM CHLORIDE 0.9 % IR SOLN
Status: DC | PRN
  Administered 2019-11-03: 3000 mL

## 2019-11-03 MED ORDER — ONDANSETRON HCL 4 MG PO TABS: 4.0000 mg | ORAL_TABLET | Freq: Four times a day (QID) | ORAL | Status: DC | PRN

## 2019-11-03 MED ORDER — DIPHENHYDRAMINE HCL 12.5 MG/5ML PO ELIX: 12.5000 mg | ORAL_SOLUTION | ORAL | Status: DC | PRN

## 2019-11-03 MED ORDER — METOPROLOL SUCCINATE ER 50 MG PO TB24
50.0000 mg | ORAL_TABLET | Freq: Every day | ORAL | Status: DC
  Administered 2019-11-05 – 2019-11-10 (×6): 50 mg via ORAL
  Filled 2019-11-03 (×7): qty 1

## 2019-11-03 MED ORDER — SODIUM CHLORIDE 0.9 % IV SOLN: INTRAVENOUS | Status: DC

## 2019-11-03 MED ORDER — OXYCODONE HCL 5 MG PO TABS
5.0000 mg | ORAL_TABLET | ORAL | Status: DC | PRN
  Administered 2019-11-03: 5 mg via ORAL
  Administered 2019-11-07 – 2019-11-08 (×2): 10 mg via ORAL
  Filled 2019-11-03: qty 2
  Filled 2019-11-03 (×2): qty 1
  Filled 2019-11-03 (×2): qty 2

## 2019-11-03 MED ORDER — METOCLOPRAMIDE HCL 5 MG PO TABS: 5.0000 mg | ORAL_TABLET | Freq: Three times a day (TID) | ORAL | Status: DC | PRN

## 2019-11-03 MED ORDER — ESCITALOPRAM OXALATE 10 MG PO TABS
20.0000 mg | ORAL_TABLET | Freq: Every day | ORAL | Status: DC
  Administered 2019-11-04 – 2019-11-10 (×7): 20 mg via ORAL
  Filled 2019-11-03 (×8): qty 2

## 2019-11-03 MED ORDER — TRANEXAMIC ACID-NACL 1000-0.7 MG/100ML-% IV SOLN
1000.0000 mg | INTRAVENOUS | Status: AC
  Administered 2019-11-03: 1000 mg via INTRAVENOUS
  Filled 2019-11-03: qty 100

## 2019-11-03 MED ORDER — BICTEGRAVIR-EMTRICITAB-TENOFOV 50-200-25 MG PO TABS
1.0000 | ORAL_TABLET | Freq: Every day | ORAL | Status: DC
  Administered 2019-11-04 – 2019-11-10 (×7): 1 via ORAL
  Filled 2019-11-03 (×7): qty 1

## 2019-11-03 MED ORDER — L-METHYLFOLATE-B6-B12 3-35-2 MG PO TABS: 1.0000 | ORAL_TABLET | Freq: Two times a day (BID) | ORAL | Status: DC

## 2019-11-03 MED ORDER — SULFAMETHOXAZOLE-TRIMETHOPRIM 800-160 MG PO TABS
1.0000 | ORAL_TABLET | Freq: Two times a day (BID) | ORAL | Status: DC
  Administered 2019-11-03 – 2019-11-10 (×14): 1 via ORAL
  Filled 2019-11-03 (×14): qty 1

## 2019-11-03 MED ORDER — EPHEDRINE SULFATE-NACL 50-0.9 MG/10ML-% IV SOSY
PREFILLED_SYRINGE | INTRAVENOUS | Status: DC | PRN
  Administered 2019-11-03 (×2): 10 mg via INTRAVENOUS
  Administered 2019-11-03: 20 mg via INTRAVENOUS

## 2019-11-03 MED ORDER — PROPOFOL 10 MG/ML IV BOLUS
INTRAVENOUS | Status: AC
  Filled 2019-11-03: qty 20

## 2019-11-03 MED ORDER — ASPIRIN 81 MG PO CHEW
81.0000 mg | CHEWABLE_TABLET | Freq: Two times a day (BID) | ORAL | Status: DC
  Administered 2019-11-03 – 2019-11-10 (×14): 81 mg via ORAL
  Filled 2019-11-03 (×14): qty 1

## 2019-11-03 MED ORDER — PROPOFOL 10 MG/ML IV BOLUS
INTRAVENOUS | Status: DC | PRN
  Administered 2019-11-03: 120 mg via INTRAVENOUS

## 2019-11-03 MED ORDER — FENTANYL CITRATE (PF) 250 MCG/5ML IJ SOLN
INTRAMUSCULAR | Status: AC
  Filled 2019-11-03: qty 5

## 2019-11-03 MED ORDER — FENTANYL CITRATE (PF) 100 MCG/2ML IJ SOLN
INTRAMUSCULAR | Status: AC
  Filled 2019-11-03: qty 2

## 2019-11-03 MED ORDER — LACTATED RINGERS IV SOLN
INTRAVENOUS | Status: DC | PRN
Start: 2019-11-03 — End: 2019-11-03

## 2019-11-03 MED ORDER — EDOXABAN TOSYLATE 30 MG PO TABS
30.0000 mg | ORAL_TABLET | Freq: Every day | ORAL | Status: DC
  Administered 2019-11-03 – 2019-11-10 (×8): 30 mg via ORAL
  Filled 2019-11-03 (×8): qty 1

## 2019-11-03 MED ORDER — CLINDAMYCIN PHOSPHATE 600 MG/50ML IV SOLN
600.0000 mg | Freq: Four times a day (QID) | INTRAVENOUS | Status: AC
  Administered 2019-11-03 – 2019-11-04 (×2): 600 mg via INTRAVENOUS
  Filled 2019-11-03 (×2): qty 50

## 2019-11-03 MED ORDER — FOLIC ACID 1 MG PO TABS
1.0000 mg | ORAL_TABLET | Freq: Every day | ORAL | Status: DC
  Administered 2019-11-03 – 2019-11-10 (×8): 1 mg via ORAL
  Filled 2019-11-03 (×9): qty 1

## 2019-11-03 MED ORDER — TAMSULOSIN HCL 0.4 MG PO CAPS
0.4000 mg | ORAL_CAPSULE | Freq: Every day | ORAL | Status: DC
  Administered 2019-11-03 – 2019-11-09 (×7): 0.4 mg via ORAL
  Filled 2019-11-03 (×7): qty 1

## 2019-11-03 MED ORDER — PHENOL 1.4 % MT LIQD: 1.0000 | OROMUCOSAL | Status: DC | PRN

## 2019-11-03 MED ORDER — FENTANYL CITRATE (PF) 250 MCG/5ML IJ SOLN
INTRAMUSCULAR | Status: DC | PRN
  Administered 2019-11-03 (×2): 50 ug via INTRAVENOUS
  Administered 2019-11-03: 100 ug via INTRAVENOUS

## 2019-11-03 MED ORDER — FA-PYRIDOXINE-CYANOCOBALAMIN 2.5-25-2 MG PO TABS
1.0000 | ORAL_TABLET | Freq: Two times a day (BID) | ORAL | Status: DC
  Administered 2019-11-03 – 2019-11-10 (×14): 1 via ORAL
  Filled 2019-11-03 (×15): qty 1

## 2019-11-03 MED ORDER — PHENYLEPHRINE HCL-NACL 10-0.9 MG/250ML-% IV SOLN
INTRAVENOUS | Status: AC
  Filled 2019-11-03: qty 750

## 2019-11-03 MED ORDER — ATORVASTATIN CALCIUM 10 MG PO TABS
10.0000 mg | ORAL_TABLET | Freq: Every day | ORAL | Status: DC
  Administered 2019-11-03 – 2019-11-09 (×7): 10 mg via ORAL
  Filled 2019-11-03 (×7): qty 1

## 2019-11-03 MED ORDER — SULFASALAZINE 500 MG PO TABS
500.0000 mg | ORAL_TABLET | Freq: Two times a day (BID) | ORAL | Status: DC
  Administered 2019-11-03 – 2019-11-10 (×14): 500 mg via ORAL
  Filled 2019-11-03 (×15): qty 1

## 2019-11-03 MED ORDER — MENTHOL 3 MG MT LOZG: 1.0000 | LOZENGE | OROMUCOSAL | Status: DC | PRN

## 2019-11-03 MED ORDER — VALACYCLOVIR HCL 500 MG PO TABS
500.0000 mg | ORAL_TABLET | Freq: Every day | ORAL | Status: DC
  Administered 2019-11-04 – 2019-11-10 (×7): 500 mg via ORAL
  Filled 2019-11-03 (×7): qty 1

## 2019-11-03 MED ORDER — PREDNISONE 5 MG PO TABS
5.0000 mg | ORAL_TABLET | Freq: Two times a day (BID) | ORAL | Status: DC
  Administered 2019-11-03 – 2019-11-10 (×14): 5 mg via ORAL
  Filled 2019-11-03 (×16): qty 1

## 2019-11-03 MED ORDER — FA-PYRIDOXINE-CYANOCOBALAMIN 2.5-25-2 MG PO TABS: 1.0000 | ORAL_TABLET | Freq: Every day | ORAL | Status: DC

## 2019-11-03 MED ORDER — HYDROMORPHONE HCL 1 MG/ML IJ SOLN
0.5000 mg | INTRAMUSCULAR | Status: DC | PRN
  Administered 2019-11-06: 1 mg via INTRAVENOUS
  Filled 2019-11-03: qty 1

## 2019-11-03 MED ORDER — TORSEMIDE 20 MG PO TABS
20.0000 mg | ORAL_TABLET | Freq: Every day | ORAL | Status: DC
  Administered 2019-11-03 – 2019-11-10 (×8): 20 mg via ORAL
  Filled 2019-11-03 (×8): qty 1

## 2019-11-03 MED ORDER — ACETAMINOPHEN 325 MG PO TABS
325.0000 mg | ORAL_TABLET | Freq: Four times a day (QID) | ORAL | Status: DC | PRN
  Administered 2019-11-05 – 2019-11-09 (×4): 650 mg via ORAL
  Filled 2019-11-03 (×5): qty 2

## 2019-11-03 MED ORDER — POVIDONE-IODINE 10 % EX SWAB: 2.0000 "application " | Freq: Once | CUTANEOUS | Status: DC

## 2019-11-03 MED ORDER — OXYCODONE HCL 5 MG PO TABS
ORAL_TABLET | ORAL | Status: AC
  Filled 2019-11-03: qty 1

## 2019-11-03 MED ORDER — METHOCARBAMOL 500 MG PO TABS
500.0000 mg | ORAL_TABLET | Freq: Four times a day (QID) | ORAL | Status: DC | PRN
  Administered 2019-11-04 – 2019-11-09 (×3): 500 mg via ORAL
  Filled 2019-11-03 (×3): qty 1

## 2019-11-03 MED ORDER — CHLORHEXIDINE GLUCONATE 0.12 % MT SOLN
15.0000 mL | Freq: Once | OROMUCOSAL | Status: AC
  Administered 2019-11-03: 15 mL via OROMUCOSAL
  Filled 2019-11-03: qty 15

## 2019-11-03 MED ORDER — DARUNAVIR-COBICISTAT 800-150 MG PO TABS
1.0000 | ORAL_TABLET | Freq: Every day | ORAL | Status: DC
  Administered 2019-11-04 – 2019-11-10 (×7): 1 via ORAL
  Filled 2019-11-03 (×7): qty 1

## 2019-11-03 MED ORDER — CLINDAMYCIN PHOSPHATE 900 MG/50ML IV SOLN
900.0000 mg | INTRAVENOUS | Status: AC
  Administered 2019-11-03: 900 mg via INTRAVENOUS
  Filled 2019-11-03: qty 50

## 2019-11-03 MED ORDER — METOCLOPRAMIDE HCL 5 MG/ML IJ SOLN: 5.0000 mg | Freq: Three times a day (TID) | INTRAMUSCULAR | Status: DC | PRN

## 2019-11-03 MED ORDER — PHENYLEPHRINE 40 MCG/ML (10ML) SYRINGE FOR IV PUSH (FOR BLOOD PRESSURE SUPPORT)
PREFILLED_SYRINGE | INTRAVENOUS | Status: DC | PRN
  Administered 2019-11-03 (×2): 160 ug via INTRAVENOUS
  Administered 2019-11-03: 200 ug via INTRAVENOUS

## 2019-11-03 MED ORDER — NITROGLYCERIN 0.4 MG SL SUBL: 0.4000 mg | SUBLINGUAL_TABLET | SUBLINGUAL | Status: DC | PRN

## 2019-11-03 MED ORDER — DEXAMETHASONE SODIUM PHOSPHATE 10 MG/ML IJ SOLN
INTRAMUSCULAR | Status: DC | PRN
  Administered 2019-11-03: 8 mg via INTRAVENOUS

## 2019-11-03 MED ORDER — ORAL CARE MOUTH RINSE: 15.0000 mL | Freq: Once | OROMUCOSAL | Status: AC

## 2019-11-03 MED ORDER — PHENYLEPHRINE HCL-NACL 10-0.9 MG/250ML-% IV SOLN
INTRAVENOUS | Status: DC | PRN
  Administered 2019-11-03: 50 ug/min via INTRAVENOUS

## 2019-11-03 SURGICAL SUPPLY — 60 items
BONE CANC CHIPS 20CC PCAN1/4 (Bone Implant) ×2 IMPLANT
BONE CANC CHIPS 40CC CAN1/2 (Bone Implant) ×2 IMPLANT
CHIPS CANC BONE 20CC PCAN1/4 (Bone Implant) ×1 IMPLANT
CHIPS CANC BONE 40CC CAN1/2 (Bone Implant) ×1 IMPLANT
COVER SURGICAL LIGHT HANDLE (MISCELLANEOUS) ×2 IMPLANT
COVER WAND RF STERILE (DRAPES) ×2 IMPLANT
CUP PINNACLE REV 60MM (Orthopedic Implant) ×2 IMPLANT
DRAPE STERI IOBAN 125X83 (DRAPES) ×2 IMPLANT
DRAPE U-SHAPE 47X51 STRL (DRAPES) ×6 IMPLANT
DRSG AQUACEL AG ADV 3.5X10 (GAUZE/BANDAGES/DRESSINGS) ×2 IMPLANT
DURAPREP 26ML APPLICATOR (WOUND CARE) ×2 IMPLANT
ELECT BLADE 4.0 EZ CLEAN MEGAD (MISCELLANEOUS) ×2
ELECT REM PT RETURN 9FT ADLT (ELECTROSURGICAL) ×2
ELECTRODE BLDE 4.0 EZ CLN MEGD (MISCELLANEOUS) ×1 IMPLANT
ELECTRODE REM PT RTRN 9FT ADLT (ELECTROSURGICAL) ×1 IMPLANT
FACESHIELD WRAPAROUND (MASK) ×6 IMPLANT
GAUZE XEROFORM 1X8 LF (GAUZE/BANDAGES/DRESSINGS) ×2 IMPLANT
GLOVE BIOGEL PI IND STRL 8 (GLOVE) ×2 IMPLANT
GLOVE BIOGEL PI INDICATOR 8 (GLOVE) ×2
GLOVE ECLIPSE 8.0 STRL XLNG CF (GLOVE) ×4 IMPLANT
GLOVE ORTHO TXT STRL SZ7.5 (GLOVE) ×4 IMPLANT
GLOVE SURG SS PI 6.5 STRL IVOR (GLOVE) ×2 IMPLANT
GOWN STRL REUS W/ TWL LRG LVL3 (GOWN DISPOSABLE) ×2 IMPLANT
GOWN STRL REUS W/ TWL XL LVL3 (GOWN DISPOSABLE) ×2 IMPLANT
GOWN STRL REUS W/TWL LRG LVL3 (GOWN DISPOSABLE) ×2
GOWN STRL REUS W/TWL XL LVL3 (GOWN DISPOSABLE) ×2
HANDPIECE INTERPULSE COAX TIP (DISPOSABLE) ×1
HEAD FEM STD 28X+12 (Hips) ×4 IMPLANT
KIT BASIN OR (CUSTOM PROCEDURE TRAY) ×2 IMPLANT
KIT TURNOVER KIT B (KITS) ×2 IMPLANT
LINER ACET BM 47X28 (Liner) ×2 IMPLANT
LINER DM PINNACLE 54/47 (Liner) ×2 IMPLANT
MANIFOLD NEPTUNE II (INSTRUMENTS) ×2 IMPLANT
NS IRRIG 1000ML POUR BTL (IV SOLUTION) ×2 IMPLANT
PACK TOTAL JOINT (CUSTOM PROCEDURE TRAY) ×2 IMPLANT
PAD ARMBOARD 7.5X6 YLW CONV (MISCELLANEOUS) ×2 IMPLANT
SCREW 6.5MMX25MM (Screw) ×2 IMPLANT
SCREW BN 35X5XPERI TPR HD (Screw) ×2 IMPLANT
SCREW BONE 5.0X35 (Screw) ×2 IMPLANT
SCREW BONE PERIPHERAL SZ 5X25 (Screw) IMPLANT
SCREW PERIPHERAL BONE SZ 5M (Screw) ×4 IMPLANT
SCREW PERIPHERAL BONE SZ 5MX45 (Screw) ×2 IMPLANT
SCREW TPR HD 5.0MM DIA 50 (Screw) ×2 IMPLANT
SET HNDPC FAN SPRY TIP SCT (DISPOSABLE) ×1 IMPLANT
SPONGE LAP 18X18 RF (DISPOSABLE) ×4 IMPLANT
STAPLER VISISTAT 35W (STAPLE) ×2 IMPLANT
SUCTION FRAZIER HANDLE 10FR (MISCELLANEOUS) ×1
SUCTION TUBE FRAZIER 10FR DISP (MISCELLANEOUS) ×1 IMPLANT
SUT ETHIBOND NAB CT1 #1 30IN (SUTURE) ×4 IMPLANT
SUT VIC AB 0 CT1 27 (SUTURE) ×1
SUT VIC AB 0 CT1 27XBRD ANBCTR (SUTURE) ×1 IMPLANT
SUT VIC AB 1 CT1 27 (SUTURE) ×1
SUT VIC AB 1 CT1 27XBRD ANBCTR (SUTURE) ×1 IMPLANT
SUT VIC AB 2-0 CT1 27 (SUTURE) ×2
SUT VIC AB 2-0 CT1 TAPERPNT 27 (SUTURE) ×2 IMPLANT
SWAB COLLECTION DEVICE MRSA (MISCELLANEOUS) ×2 IMPLANT
SWAB CULTURE ESWAB REG 1ML (MISCELLANEOUS) ×2 IMPLANT
TOWEL GREEN STERILE (TOWEL DISPOSABLE) ×2 IMPLANT
TOWEL GREEN STERILE FF (TOWEL DISPOSABLE) ×2 IMPLANT
WATER STERILE IRR 1000ML POUR (IV SOLUTION) ×4 IMPLANT

## 2019-11-03 NOTE — Op Note (Signed)
NAME: [name], [name]. MEDICAL RECORD DI:[id number] ACCOUNT [id number] DATE OF BIRTH:[birth year] FACILITY: [facility] LOCATION: [facility] PHYSICIAN:Kasyn Stouffer Kerry Fort, MD  OPERATIVE REPORT  DATE OF PROCEDURE:  11/03/2019  PREOPERATIVE DIAGNOSIS:  Failed left hip acetabular component with aseptic loosening.  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  PROCEDURE:  Revision arthroplasty of left hip acetabular component and polyethylene liner and hip ball.  IMPLANTS:  Pinnacle revision 60 mm cup with 6 peripheral and 1 central screw, size 54/47 dual mobility liner with a 28/47 dual mobility combined with a 28+12 metal hip ball.  SURGEON:  Lind Guest. Ninfa Linden, MD  ASSISTANT:  Erskine Emery, PA-C  ANESTHESIA:  General.  ANTIBIOTICS:  900 mg IV clindamycin.  ESTIMATED BLOOD LOSS:  400 mL.  Intraoperative Gram stain was negative for organisms and rare white blood cells.  INDICATIONS:  The patient is a 69 year old gentleman well known to me.  In April of last year, he had an urgent total arthroplasty of his left hip due to avascular necrosis with acute femoral head collapse.  He is someone who is quite difficult to treat  anatomically due to spinal fusion to the sacrum and given the fact that he walks was such a significant kyphosis, this puts [place] in the position of needing to really antevert the acetabular component.  In spite of meticulous positioning of the acetabular  component at the time of surgery, he did eventually felt that with posterior dislocations based on his anatomy.  It was hard to really find the correct zone of where to put him for stability purposes.  It did take at least a revision of the acetabular  component and eventually a constrained liner.  He had actually done well for many months.  He is cachectic individual and does have other medical issues.  In the late fall and early winter, he had a very hard mechanical fall onto  that hip.  It became  quite painful and I did obtain our CT scan of the pelvis.  It did show fractures of the pelvis.  We then had him for a period of nonweightbearing.  Over time, we have watched the acetabular component medialize more and it has been more into a vertical  position showing really no purchase of the screws.  I did get a repeat CT scan that showed the fractures have healed.  At this point, we have recommended a revision of the acetabular component with placement of bone graft and trying a dual mobility  construct as well to hopefully keep him from dislocating.  Given the severity of pain and deformity he is having, we have recommended surgery.  We had a long and thorough discussion about the risk of mainly infection, acute blood loss anemia, nerve or  vessel injury, DVT and then especially hardware failure and dislocation.  I am uncertain as the position to proceed in if this fails.  He does understand that as well.  DESCRIPTION OF PROCEDURE:  After informed consent was obtained and appropriate left hip was marked, he was brought to the operating room and general anesthesia was obtained while he was on a stretcher.  I assessed his leg lengths and they were equal.   However, prior to his fall, his leg lengths show that he was longer on that side, so we certainly do we need to lengthen him.  After general anesthesia was obtained, he was placed supine on the [place] operative table with a perineal post in place and both  legs in line skeletal traction devices and boots with no traction applied.  His left operative hip was prepped and draped with DuraPrep and sterile drapes.  A timeout was called to identify correct patient and correct left hip.  We then proceeded through  his previous incision and dissected down to the tensor fascia lata, which was then divided longitudinally.  Due to multiple surgeries he has had, there was significant scar tissue encountered.  I was able to get down to the hip  joint itself.  Once we  removed a significant amount of scarring and other tissue and debris around the acetabular component, we were able to remove the constrained locking ring and then dislocate the hip.  We removed the previous liner.  We then found none of the previous  screws in the acetabular component that had any purchase to them at all and the acetabular component with removed in its entirety.  I then irrigated the soft tissue with normal saline solution using pulsatile lavage.  Prior to [place] removing anything, we  did send off Gram stain and cultures, which shows rare white blood cells and no organisms.  Once I curetted out all the soft tissue that I could from the acetabular component, I then went up 1 reamer size to a 59 reamer and just reamed a little bit just  to get some peripheral reaming.  We could not medialize anymore.  We then placed 60 mL of cancellous chips down into the deficit of the acetabulum.  Of note, there was no noted pelvic incongruity at all.  I then had the staff open up the size 60 Pinnacle  revision acetabular component from [employer].  This was a size 60 cup.  We had to try to be as meticulous as possible getting it into an area where it would stay in the bone and get purchase, as well as hopefully getting a little bit more horizontal  position in some more anteversion given his propensity to dislocate.  I was able to only obtain a certain position of anteversion given this was the only area where I could get this cup to seat and once we got it into place, we put 6 peripheral screws  and 1 central screw.  We verified the placement under direct visualization and fluoroscopy.  We then trialed our liner and hip ball and we decided to go with the real 54, 4/47 dual mobility liner and went with a 28/47+12 metal hip ball.  This was a dual  mobility construct and reduced this in the acetabulum.  We were able to assess it mechanically and radiographically and it felt tight and stable  and we were pleased with the offset and positioning radiographically.  However, I had to be as cautious as  possible given the fact of his high likelihood of dislocation.  We then irrigated the soft tissue again with normal saline solution using pulsatile lavage.  The deep tissue was closed with #1 Ethibond suture, followed by #1 Vicryl to close deep tissue,  2-0 Vicryl to close the subcutaneous tissue and interrupted staples to reapproximate the skin.  Xeroform and Aquacel dressing was applied.  He was awakened, extubated, and taken to the recovery room in stable condition.  All final counts were correct.   There were no complications noted.  Of note, Benita Stabile, PA-C, assisted in the entire case and his assistance was crucial for facilitating all aspects of this case.  Postoperatively, we are going to have him just touchdown  weightbearing for now with  mainly using his mobility scooter until we are confident he is getting better bone ingrowth.  VN/NUANCE  D:11/03/2019 T:11/03/2019 JOB:[id number]

## 2019-11-03 NOTE — Anesthesia Postprocedure Evaluation (Signed)
Anesthesia Post Note  Patient: [name]  Procedure(s) Performed: REVISION LEFT HIP ACETABULAR COMPONENT (Left Hip)     Patient location during evaluation: PACU Anesthesia Type: General Level of consciousness: awake and alert Pain management: pain level controlled Vital Signs Assessment: post-procedure vital signs reviewed and stable Respiratory status: spontaneous breathing, nonlabored ventilation, respiratory function stable and patient connected to nasal cannula oxygen Cardiovascular status: blood pressure returned to baseline and stable Postop Assessment: no apparent nausea or vomiting Anesthetic complications: no   No complications documented.  Last Vitals:  Vitals:   11/03/19 1458 11/03/19 1502  BP:  (!) 94/53  Pulse:  74  Resp: 15 18  Temp:  36.5 C  SpO2:  94%    Last Pain:  Vitals:   11/03/19 1515  TempSrc:   PainSc: 0-No pain                 Dajanay Northrup S

## 2019-11-03 NOTE — Progress Notes (Signed)
Pt arrived to unit in hospital bed from PACU, with staff in pleasant mood, alert/oriented in no acute distress.Pt situated/orientated to room/equipments.Pt's welcome guide/packet/menu provided.Pt has been informed that facility is NOT responsible for any losses/damages to personal belongings/valuables No complaijnts. All questions were fully answered..Pt's bed lowest position with 3 side rails up, call bell/room phone within reach.and all wheels locked.

## 2019-11-03 NOTE — Transfer of Care (Signed)
Immediate Anesthesia Transfer of Care Note  Patient: [name]  Procedure(s) Performed: REVISION LEFT HIP ACETABULAR COMPONENT (Left Hip)  Patient Location: PACU  Anesthesia Type:General  Level of Consciousness: oriented, drowsy and patient cooperative  Airway & Oxygen Therapy: Patient Spontanous Breathing and Patient connected to face mask oxygen  Post-op Assessment: Report given to RN and Post -op Vital signs reviewed and stable  Post vital signs: Reviewed  Last Vitals:  Vitals Value Taken Time  BP 112/55 11/03/19 1112  Temp 36.8 C 11/03/19 1112  Pulse 77 11/03/19 1116  Resp 15 11/03/19 1116  SpO2 100 % 11/03/19 1116  Vitals shown include unvalidated device data.  Last Pain:  Vitals:   11/03/19 1112  TempSrc:   PainSc: (P) Asleep      Patients Stated Pain Goal: 2 (93/96/88 6484)  Complications: No complications documented.

## 2019-11-03 NOTE — H&P (Signed)
TOTAL HIP REVISION ADMISSION H&P  Patient is admitted for left revision total hip arthroplasty.  Subjective:  Chief Complaint: left hip pain  HPI:  The patient is very well-known to me.  He had his original hip replaced in the spring of last year.  This is quite a difficult surgery given the fact that the patient has a spine fusion through the pelvis and sacrum.  He walks with a significant kyphosis leaning way forward.  After the original surgery he subsequently dislocated his hip.  We had to perform other surgeries to stabilize the acetabular component and changes direction as well as place a constrained liner.  He had done well for many months and then sustained a hard mechanical fall onto that hip.  With time a CT scan was obtained and showed fracture line in the pelvis.  The acetabular component is medialized more changes in addition.  He has significant lateral pain.  He is quite cachectic individual and this is definitely affecting his mobility and his quality of life.  Given his plain film findings at this point we are recommending a revision of the acetabulum.  Patient Active Problem List   Diagnosis Date Noted  . Failed total hip arthroplasty, subsequent encounter 11/03/2019  . Aortic atherosclerosis ([place]) 10/20/2019  . Weak urinary stream 09/21/2019  . Community acquired pneumonia 09/12/2019  . Neuropathy due to HIV ([place]) 08/17/2019  . Benign prostatic hyperplasia 08/04/2019  . Chronic bronchitis, mucopurulent ([place]) 08/01/2019  . Drug-induced polyneuropathy ([place]) 06/14/2019  . Educated about COVID-19 virus infection 06/05/2019  . Personal history of TIA (transient ischemic attack) 04/13/2019  . HIV-1 associated autonomic neuropathy ([place]) 04/13/2019  . Ataxia 04/13/2019  . Impaired functional mobility, balance, gait, and endurance 04/13/2019  . Cerebellar ataxia in diseases classified elsewhere ([place]) 04/13/2019  . Recurrent major depressive disorder, in partial remission ([place])  11/23/2018  . Atherosclerosis of coronary artery bypass graft(s), unspecified, with other forms of angina pectoris ([place]) 11/23/2018  . Murmur, cardiac 11/23/2018  . Iron deficiency anemia due to dietary causes 11/17/2018  . Bilateral leg edema 11/16/2018  . Failure of right total hip arthroplasty with dislocation of hip ([place])   . Dislocation of hip prosthesis ([place])   . Avascular necrosis of bone of hip, left ([place]) 08/09/2018  . Eczema 07/01/2018  . Candida rash of groin 07/01/2018  . Depression with anxiety 01/14/2018  . 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 Carotid artery disease ([place]) 11/13/2016  . Stenosis of carotid artery 11/13/2016  . Spinal stenosis of thoracolumbar region 07/02/2016  . ILD (interstitial lung disease) ([place]) 09/13/2015  . Immunocompromised state ([place]) 09/13/2015  . Tinea cruris 09/12/2015  . PCP (pneumocystis jiroveci pneumonia) ([place]) 06/18/2015  . Postinflammatory pulmonary fibrosis ([place]) 05/11/2015  . GERD (gastroesophageal reflux disease)   . Hereditary and idiopathic peripheral neuropathy 09/08/2013  . Erosive esophagitis 12/28/2012  . Allergic rhinitis, cause unspecified 04/28/2012  . Long term current use of anticoagulant therapy 02/03/2012  . Hypertension   . DVT, lower extremity, recurrent ([place])   . Gout   . Carotid artery occlusion   . Hyperlipidemia with target LDL less than 100   . HIV infection ([place]) 04/08/2011  . Arthritis, rheumatoid ([place]) 04/08/2011  . CLL (chronic lymphoid leukemia) in relapse ([place]) 04/08/2011  . Type 2 diabetes mellitus with diabetic polyneuropathy, without long-term current use of insulin ([place]) 04/02/2011  . Impotence of organic origin 04/02/2011  . CAD (coronary artery disease) of artery bypass graft 02/14/2009  . Herpes genitalis  06/22/2008   Past Medical History:  Diagnosis Date  . Allergy   . Anxiety   . Carotid artery occlusion    40-60% right ICA stenosis (09/2008)  . Cataract   . Chronic back pain    . CLL (chronic lymphoblastic leukemia) dx 2010   Followed at [facility] q[facility], no current therapy   . Clotting disorder ([place])   . Coronary artery disease 2010   s/p CABG '10, sees Dr. Percival Spanish  . Depression   . Diverticulosis   . DVT, lower extremity, recurrent ([place]) 2008, 2009   LLE, chronic anticoag since 2009  . Esophagitis   . Fibromyalgia   . Gallstones   . GERD (gastroesophageal reflux disease)   . Gout   . Gynecomastia, male   . H/O hiatal hernia 2008   surgery  . Hemorrhoids   . Hepatitis A yrs ago  . HIV infection ([place]) dx 1993  . Hypertension   . Impotence of organic origin   . Myocardial infarction ([place]) 2010    x 2  . Neuromuscular disorder ([facility])    neuropathy  . Osteoarthritis, knee    s/p B TKA  . Osteoporosis   . PONV (postoperative nausea and vomiting)   . Pulmonary fibrosis ([place]) 2017  . Rheumatoid arthritis(714.0) dx 2010   MTX, follows with rheum  . Seasonal allergies   . Secondary syphilis 07/24/14 dx   s/p 2 wks doxy  . Status post dilation of esophageal narrowing   . Stroke [facility]) 1969   TIA  . TIA (transient ischemic attack) 1997   mild residual L mouth droop  . Tubular adenoma of colon     Past Surgical History:  Procedure Laterality Date  . ANTERIOR HIP REVISION Left 08/17/2018   Procedure: ANTERIOR LEFT HIP REVISION;  Surgeon: Mcarthur Rossetti, MD;  Location: [facility];  Service: Orthopedics;  Laterality: Left;  . ANTERIOR HIP REVISION Left 11/02/2018   Procedure: LEFT HIP CONSTRAINED LINER REVISION;  Surgeon: Mcarthur Rossetti, MD;  Location: [facility];  Service: Orthopedics;  Laterality: Left;  . BACK SURGERY  2010 & 2017  . CHOLECYSTECTOMY    . COLONOSCOPY WITH PROPOFOL N/A 12/28/2012   Procedure: COLONOSCOPY WITH PROPOFOL;  Surgeon: Jerene Bears, MD;  [facility];  Service: Gastroenterology;  Laterality: N/A;  . CORONARY ARTERY BYPASS GRAFT  2010   triple bypass  . ESOPHAGOGASTRODUODENOSCOPY (EGD) WITH PROPOFOL N/A 12/28/2012    Procedure: ESOPHAGOGASTRODUODENOSCOPY (EGD) WITH PROPOFOL;  Surgeon: Jerene Bears, MD;  [facility];  Service: Gastroenterology;  Laterality: N/A;  . ESOPHAGOGASTRODUODENOSCOPY (EGD) WITH PROPOFOL N/A 03/15/2013   Procedure: ESOPHAGOGASTRODUODENOSCOPY (EGD) WITH PROPOFOL;  Surgeon: Jerene Bears, MD;  [facility];  Service: Gastroenterology;  Laterality: N/A;  . ESOPHAGOGASTRODUODENOSCOPY (EGD) WITH PROPOFOL N/A 02/07/2016   Procedure: ESOPHAGOGASTRODUODENOSCOPY (EGD) WITH PROPOFOL;  Surgeon: Jerene Bears, MD;  [facility];  Service: Gastroenterology;  Laterality: N/A;  . HARDWARE REMOVAL N/A 07/02/2012   Procedure: HARDWARE REMOVAL;  Surgeon: Elaina Hoops, MD;  Location: [facility];  Service: Neurosurgery;  Laterality: N/A;  . HIATAL HERNIA REPAIR     wrap  . HIP CLOSED REDUCTION Left 08/15/2018   Procedure: CLOSED REDUCTION HIP;  Surgeon: Newt Minion, MD;  Location: [facility];  Service: Orthopedics;  Laterality: Left;  . HIP CLOSED REDUCTION Left 08/28/2018   Procedure: CLOSED REDUCTION HIP FOR RECURRENT DISLOCATION AND DRESSING CHANGE;  Surgeon: Jessy Oto, MD;  Location: [facility];  Service:  Orthopedics;  Laterality: Left;  . HIP CLOSED REDUCTION Left 09/15/2018   Procedure: CLOSED REDUCTION HIP DISLOCATION;  Surgeon: Mcarthur Rossetti, MD;  Location: [facility];  Service: Orthopedics;  Laterality: Left;  . HIP CLOSED REDUCTION Left 10/31/2018   Procedure: CLOSED REDUCTION LEFT TOTAL HIP;  Surgeon: Leandrew Koyanagi, MD;  Location: [facility];  Service: Orthopedics;  Laterality: Left;  . INGUINAL HERNIA REPAIR Bilateral   . JOINT REPLACEMENT Left 1999  . KNEE ARTHROPLASTY  07/22/2011   Procedure: COMPUTER ASSISTED TOTAL KNEE ARTHROPLASTY;  Surgeon: Meredith Pel, MD;  Location: [facility];  Service: Orthopedics;  Laterality: Right;  Right total knee arthroplasty  . MANDIBLE SURGERY Bilateral    tmj  . REPLACEMENT TOTAL KNEE Bilateral   . ring around testicle hernia reapir   184 and 1986   x 2  . ROTATOR CUFF REPAIR Right   . SHOULDER SURGERY Left   . SPINE SURGERY  2010   "rod and screws", "failed", lopwer spine,   . stent to heart x 1  2010  . TONSILLECTOMY    . TOTAL HIP ARTHROPLASTY Left 08/13/2018   Procedure: LEFT TOTAL HIP ARTHROPLASTY ANTERIOR APPROACH;  Surgeon: Mcarthur Rossetti, MD;  Location: [facility];  Service: Orthopedics;  Laterality: Left;  . TOTAL HIP ARTHROPLASTY Left 09/17/2018   Procedure: ANTERIOR LEFT HIP REVISION;  Surgeon: Mcarthur Rossetti, MD;  Location: [facility];  Service: Orthopedics;  Laterality: Left;  . UMBILICAL HERNIA REPAIR     x 1  . varicose vein     stripping  . VIDEO BRONCHOSCOPY Bilateral 10/16/2015   Procedure: VIDEO BRONCHOSCOPY WITHOUT FLUORO;  Surgeon: Brand Males, MD;  [facility];  Service: Cardiopulmonary;  Laterality: Bilateral;    Current Facility-Administered Medications  Medication Dose Route Frequency Provider Last Rate Last Admin  . clindamycin (CLEOCIN) IVPB 900 mg  900 mg Intravenous On Call to OR Pete Pelt, PA-C      . povidone-iodine 10 % swab 2 application  2 application Topical Once Pete Pelt, PA-C      . tranexamic acid (CYKLOKAPRON) IVPB 1,000 mg  1,000 mg Intravenous To OR Pete Pelt, PA-C       Facility-Administered Medications Ordered in Other Encounters  Medication Dose Route Frequency Provider Last Rate Last Admin  . lactated ringers infusion   Intravenous Continuous PRN Raenette Rover, CRNA   New Bag at 11/03/19 0710   Allergies  Allergen Reactions  . Golimumab Anaphylaxis    Simponi ARIA  . Orencia [Abatacept] Anaphylaxis  . Other Anaphylaxis and Hives    Pecans  . Peanut-Containing Drug Products Anaphylaxis, Hives and Swelling    Swelling of throat  . Morphine Other (See Comments)    Severe headache- Can tolerate Dilaudid, however  . Oxycodone-Acetaminophen Other (See Comments)    Headache 6.22.2020 patient is currently taking and tolerating   . Penicillins Rash and Other (See Comments)    FLUSHED & RED, also Has patient had a PCN reaction causing immediate rash, facial/tongue/throat swelling, SOB or lightheadedness with hypotension: Yes Has patient had a PCN reaction causing severe rash involving mucus membranes or skin necrosis: No Has patient had a PCN reaction that required hospitalization: No Has patient had a PCN reaction occurring within the last 10 years: No If all of the above answers are "NO", then may proceed with Cephalosporin use  . Promethazine Hcl Other (See Comments)    Makes him feel "drunk" at higher strengths  Social History   Tobacco Use  . Smoking status: Never Smoker  . Smokeless tobacco: Never Used  . Tobacco comment: occ wine  Substance Use Topics  . Alcohol use: Not Currently    Comment: occasional wine- 1-2 per week    Family History  Problem Relation Age of Onset  . Breast cancer Mother   . Hypertension Mother   . Hyperlipidemia Mother   . Diabetes Mother   . Prostate cancer Father   . Colon polyps Father   . Hyperlipidemia Father   . Crohn's disease Paternal Aunt   . Diabetes Maternal Grandmother   . Diabetes Brother        x 3  . Heart attack Brother   . Heart disease Brother        x 3  . Heart attack Brother   . Hyperlipidemia Brother        x 3  . Alcohol abuse Daughter   . Drug abuse Daughter   . Asthma Brother   . CVA Brother   . Colon cancer Neg Hx   . Esophageal cancer Neg Hx   . Rectal cancer Neg Hx   . Stomach cancer Neg Hx       Review of Systems  All other systems reviewed and are negative.   Objective:  Physical Exam  Vitals reviewed. Constitutional: He is oriented to person, place, and time.  HENT:  Head: Normocephalic and atraumatic.  Eyes: Pupils are equal, round, and reactive to light.  Cardiovascular: Normal rate and normal pulses.  Respiratory: Effort normal.  GI: Soft. Normal appearance.  Musculoskeletal:     Cervical back: Normal range of  motion.     Left hip: Bony tenderness present. Decreased range of motion. Decreased strength.  Neurological: He is alert and oriented to person, place, and time.  Skin: Skin is warm and dry.  Psychiatric: His behavior is normal.    Vital signs in last 24 hours: Temp:  [97.7 F (36.5 C)] 97.7 F (36.5 C) (06/24 9678) Pulse Rate:  [66] 66 (06/24 0633) Resp:  [18] 18 (06/24 0633) BP: (113)/(62) 113/62 (06/24 0633) SpO2:  [95 %] 95 % (06/24 9381) Weight:  [78.9 kg] 78.9 kg (06/24 0633)   Labs:   Estimated body mass index is 25.7 kg/m as calculated from the following:   Height as of this encounter: 5\' 9"  (1.753 m).   Weight as of this encounter: 78.9 kg.  Imaging Review:  Plain radiographs demonstrate progressive loosening of left hip acetabular component.  The acetabular component has become more medialized and more vertical after a very hard mechanical fall.  There was noted fracture lines in the pelvis on previous CT scan.   Assessment/Plan:  The plan will be to proceed to surgery today for revision of the acetabular component.  We will likely need to place him supplemental bone grafting and place a much larger cup with supplemental fixation with screws and potentially improve mobility type of construct to decrease his ability to dislocate given his significant fusion of the spine to his pelvis combined with his kyphosis.  He understands fully that this is a very risky surgery and difficult given his previous history of dislocations.  The risk and benefits of surgery were discussed in detail and informed consent was obtained.

## 2019-11-03 NOTE — Brief Op Note (Signed)
11/03/2019  10:52 AM  PATIENT:  [name]  69 y.o. male  PRE-OPERATIVE DIAGNOSIS:  failed left hip acetabular component  POST-OPERATIVE DIAGNOSIS:  failed left hip acetabular component  PROCEDURE:  Procedure(s): REVISION LEFT HIP ACETABULAR COMPONENT (Left)  SURGEON:  Surgeon(s) and Role:    Mcarthur Rossetti, MD - Primary  PHYSICIAN ASSISTANT: Benita Stabile, PA-C asissted  ANESTHESIA:   general  EBL:  400 mL   COUNTS:  YES  DICTATION: .Other Dictation: Dictation Number [id number]  PLAN OF CARE: Admit to inpatient   PATIENT DISPOSITION:  PACU - hemodynamically stable.   Delay start of Pharmacological VTE agent (>24hrs) due to surgical blood loss or risk of bleeding: no

## 2019-11-03 NOTE — Anesthesia Procedure Notes (Signed)
Procedure Name: Intubation Date/Time: 11/03/2019 7:39 AM Performed by: Raenette Rover, CRNA Pre-anesthesia Checklist: Patient identified, Emergency Drugs available, Suction available and Patient being monitored Patient Re-evaluated:Patient Re-evaluated prior to induction Oxygen Delivery Method: Circle system utilized Preoxygenation: Pre-oxygenation with 100% oxygen Induction Type: IV induction Ventilation: Mask ventilation without difficulty and Two handed mask ventilation required Laryngoscope Size: Miller and 3 Grade View: Grade I Tube type: Oral Tube size: 7.5 mm Number of attempts: 1 Airway Equipment and Method: Stylet Placement Confirmation: ETT inserted through vocal cords under direct vision,  positive ETCO2 and breath sounds checked- equal and bilateral Secured at: 23 cm Tube secured with: Tape Dental Injury: Teeth and Oropharynx as per pre-operative assessment  Comments: Large beard

## 2019-11-04 LAB — BASIC METABOLIC PANEL
Anion gap: 6 (ref 5–15)
BUN: 13 mg/dL (ref 8–23)
CO2: 28 mmol/L (ref 22–32)
Calcium: 7.8 mg/dL — ABNORMAL LOW (ref 8.9–10.3)
Chloride: 103 mmol/L (ref 98–111)
Creatinine, Ser: 0.75 mg/dL (ref 0.61–1.24)
GFR calc Af Amer: >60 mL/min (ref >60)
GFR calc non Af Amer: >60 mL/min (ref >60)
Glucose, Bld: 137 mg/dL — ABNORMAL HIGH (ref 70–99)
Potassium: 3.8 mmol/L (ref 3.5–5.1)
Sodium: 137 mmol/L (ref 135–145)

## 2019-11-04 LAB — CBC
HCT: 25.9 % — ABNORMAL LOW (ref 39.0–52.0)
Hemoglobin: 8 g/dL — ABNORMAL LOW (ref 13.0–17.0)
MCH: 36.5 pg — ABNORMAL HIGH (ref 26.0–34.0)
MCHC: 30.9 g/dL (ref 30.0–36.0)
MCV: 118.3 fL — ABNORMAL HIGH (ref 80.0–100.0)
Platelets: 111 10*3/uL — ABNORMAL LOW (ref 150–400)
RBC: 2.19 MIL/uL — ABNORMAL LOW (ref 4.22–5.81)
RDW: 15.1 % (ref 11.5–15.5)
WBC: 10.6 10*3/uL — ABNORMAL HIGH (ref 4.0–10.5)
nRBC: 0 % (ref 0.0–0.2)

## 2019-11-04 NOTE — Progress Notes (Signed)
Physical Therapy Treatment Patient Details Name: [name] MRN: [id number] DOB: 1950/12/23 Today's Date: 11/04/2019    History of Present Illness 69 yo male with onset of hard mechanical fall resulting in pelvic fracture around a previous L THA.  Since then has been quite painful and is admitted for revision of L acetabular component with bone graft and dual mobility to ease tendency to dislocate L hip. Permitted TDWB with transfers only.  PMHx:  avascular necrosis L hip, THA L hip, mult dislocations L hip, pelvic fracture, full length spinal fusion, BPH, bronchitis, TIA, Covid 19, CAP, HIV related neuropathy, aortic atherosclerosis, heart murmur, cerebellar ataxia,      PT Comments    Pt was seen for mobility on BLE's and transfer back to bed with good response to all.  He is getting better at managing weak L hip and handling movement due to sensitivity of assisted help.  Pt is motivated, will need to be assisted on crutches to climb stairs if CIR does not work out and if he cannot get a ramp installed at home.  Daughter is trying to assist with this, and will anticipate a rehab stay is needed given his living alone, requiring help to move and inability to WB on LLE.  Focus on LLE strength, tolerance for being OOB and standing, and to work on mobility with limitations of LLE.  Follow Up Recommendations  CIR     Equipment Recommendations  None recommended by PT    Recommendations for Other Services Rehab consult     Precautions / Restrictions Precautions Precautions: Fall Precaution Comments: TDWB on LLE Restrictions Weight Bearing Restrictions: Yes Other Position/Activity Restrictions: monitor ROM on L hip to within comfort    Mobility  Bed Mobility Overal bed mobility: Needs Assistance Bed Mobility: Supine to Sit     Supine to sit: Min assist     General bed mobility comments: min assist to assist with trunk support and set up on bed for safety to side of  bed  Transfers Overall transfer level: Needs assistance Equipment used: 1 person hand held assist Transfers: Lateral/Scoot Transfers          Lateral/Scoot Transfers: Min assist;From elevated surface General transfer comment: min assist to scoot to bed with pt mainly moving LLE due to sensitivity to assisted help  Ambulation/Gait             General Gait Details: deferred    Stairs             Wheelchair Mobility    Modified Rankin (Stroke Patients Only)       Balance Overall balance assessment: Needs assistance Sitting-balance support: Bilateral upper extremity supported;Feet supported Sitting balance-[name]: Fair                                      Cognition Arousal/Alertness: Awake/alert Behavior During Therapy: WFL for tasks assessed/performed Overall Cognitive Status: Within Functional Limits for tasks assessed                                        Exercises General Exercises - Lower Extremity Ankle Circles/Pumps: AROM;5 reps Quad Sets: AROM;5 reps Gluteal Sets: AROM;10 reps    General Comments General comments (skin integrity, edema, etc.): Has been instructed on ROM to do in bed, has instructions  from the past for ther ex to BLE's post hip replacement      Pertinent Vitals/Pain Pain Assessment: Faces Faces Pain Scale: Hurts even more Pain Location: L hip with sudden movements Pain Descriptors / Indicators: Guarding;Grimacing;Operative site guarding Pain Intervention(s): Ice applied;Repositioned;Premedicated before session    Home Living Family/patient expects to be discharged to:: Private residence Living Arrangements: Alone Available Help at Discharge: Family;Available PRN/intermittently Type of Home: House Home Access: Stairs to enter Entrance Stairs-Rails: Right;Left Home Layout: One level Home Equipment: Environmental consultant - 2 wheels;Cane - single point Additional Comments: has been able to walk well  until recent increase in L hip pain    Prior Function Level of Independence: Independent with assistive device(s)      Comments: mostly rollator used   PT Goals (current goals can now be found in the care plan section) Acute Rehab PT Goals Patient Stated Goal: to get up ramp if it can be installed at home PT Goal Formulation: With patient Time For Goal Achievement: 11/18/19 Potential to Achieve Goals: Good    Frequency    7X/week      PT Plan Current plan remains appropriate    Co-evaluation              AM-PAC PT "6 Clicks" Mobility   Outcome Measure  Help needed turning from your back to your side while in a flat bed without using bedrails?: A Little Help needed moving from lying on your back to sitting on the side of a flat bed without using bedrails?: A Little Help needed moving to and from a bed to a chair (including a wheelchair)?: A Little Help needed standing up from a chair using your arms (e.g., wheelchair or bedside chair)?: A Lot Help needed to walk in hospital room?: A Lot Help needed climbing 3-5 steps with a railing? : Total 6 Click Score: 14    End of Session Equipment Utilized During Treatment: Gait belt Activity Tolerance: Patient tolerated treatment well;Treatment limited secondary to medical complications (Comment) Patient left: in bed;with bed alarm set;with call bell/phone within reach Nurse Communication: Mobility status PT Visit Diagnosis: Unsteadiness on feet (R26.81)     Time: 7290-2111 PT Time Calculation (min) (ACUTE ONLY): 24 min  Charges:  $Therapeutic Activity: 23-37 mins                  Ramond Dial 11/04/2019, 5:05 PM  Mee Hives, PT MS Acute Rehab Dept. Number: [place] and [place]

## 2019-11-04 NOTE — Plan of Care (Signed)
  Problem: Education: Goal: Knowledge of General Education information will improve Description: Including pain rating scale, medication(s)/side effects and non-pharmacologic comfort measures Outcome: Progressing   Problem: Health Behavior/Discharge Planning: Goal: Ability to manage health-related needs will improve Outcome: Progressing   Problem: Activity: Goal: Risk for activity intolerance will decrease Outcome: Progressing   Problem: Nutrition: Goal: Adequate nutrition will be maintained Outcome: Progressing   Problem: Elimination: Goal: Will not experience complications related to bowel motility Outcome: Progressing   Problem: Pain Managment: Goal: General experience of comfort will improve Outcome: Progressing   Problem: Safety: Goal: Ability to remain free from injury will improve Outcome: Progressing   

## 2019-11-04 NOTE — Discharge Instructions (Signed)
Information on my medicine - SAVAYSA (edoxaban)  WHY WAS SAVAYSA PRESCRIBED FOR YOU? [facility] was prescribed for previous recurrent blood clots that may have been found in the veins of your legs (deep vein thrombosis) or in your lungs (pulmonary embolism) and to reduce the risk of them occurring again.  WHAT DO YOU NEED TO KNOW ABOUT SAVAYSA ? Take your Savaysa ONCE DAILY - one tablet in the morning with or without food.  It would be best to take the doses about the same time each day.  If you have difficulty swallowing the tablet whole please discuss with your pharmacist how to take the medication safely.  Take Savaysa exactly as prescribed by your doctor and DO NOT stop taking Savaysa without talking to the doctor who prescribed the medication.  Stopping may increase your risk of developing a new clot or stroke.  Refill your prescription before you run out.  After discharge, you should have regular check-up appointments with your healthcare provider that is prescribing your Savaysa.  In the future your dose may need to be changed if your kidney function or weight changes by a significant amount or as you get older.  WHAT DO YOU DO IF YOU MISS A DOSE? If you are taking Savaysa ONCE DAILY and you miss a dose, take it as soon as you remember on the same day then continue your regularly scheduled once daily regimen the next day. Do not take two doses of Savaysa at the same time or on the same day.  IMPORTANT SAFETY INFORMATION A possible side effect of Savaysa is bleeding. You should call your healthcare provider right away if you experience any of the following: ? Bleeding from an injury or your nose that does not stop. ? Unusual colored urine (red or dark brown) or unusual colored stools (red or black). ? Unusual bruising for unknown reasons. ? A serious fall or if you hit your head (even if there is no bleeding).  Some medicines may interact with Savaysa and might increase your  risk of bleeding or clotting while on Savaysa. To help avoid this, consult your healthcare provider or pharmacist prior to using any new prescription or non-prescription medications, including herbals, vitamins, non-steroidal anti-inflammatory drugs (NSAIDs) and supplements.  This website has more information on Savaysa (edoxaban): [email or web address].com

## 2019-11-04 NOTE — Plan of Care (Signed)

## 2019-11-04 NOTE — Progress Notes (Signed)
Patient ID: [name], male   DOB: 1950/06/28, 69 y.o.   MRN: [id number] I have not seen the patient yet this morning.  He does have acute blood loss anemia from the surgery with a hemoglobin of 8.  His vital signs are stable.  We will have him here through the weekend in order to assess his progress of mobility and to reevaluate his medical status.  I will check his CBC again tomorrow.  From a therapy standpoint, I want him to limit his mobility.  He can put weight on that left hip just to pivot to his scooter or wheelchair or when he is getting to the bathroom but otherwise I do not want him putting full weight walking through the hip on a regular basis.  He understands that we will need to do this for the next 4 to 6 weeks to allow good bone ingrowth of the acetabular component.

## 2019-11-04 NOTE — Progress Notes (Signed)
Subjective: 1 Day Post-Op Procedure(s) (LRB): REVISION LEFT HIP ACETABULAR COMPONENT (Left) Patient reports pain as moderate.  Acute blood loss anemia from complex surgery.  Tolerating well thus far.  Objective: Vital signs in last 24 hours: Temp:  [97.5 F (36.4 C)-99 F (37.2 C)] 98.2 F (36.8 C) (06/25 0749) Pulse Rate:  [76-83] 78 (06/25 0749) Resp:  [16-18] 17 (06/25 0749) BP: (102-117)/(56-67) 102/60 (06/25 0749) SpO2:  [95 %-97 %] 97 % (06/25 0749)  Intake/Output from previous day: 06/24 0701 - 06/25 0700 In: 3351.9 [P.O.:720; I.V.:2381.9; IV Piggyback:250] Out: 2350 [Urine:1950; Blood:400] Intake/Output this shift: Total I/O In: 360 [P.O.:360] Out: 1600 [Urine:1600]  Recent Labs    11/04/19 0344  HGB 8.0*   Recent Labs    11/04/19 0344  WBC 10.6*  RBC 2.19*  HCT 25.9*  PLT 111*   Recent Labs    11/04/19 0344  NA 137  K 3.8  CL 103  CO2 28  BUN 13  CREATININE 0.75  GLUCOSE 137*  CALCIUM 7.8*   No results for input(s): LABPT, INR in the last 72 hours.  Sensation intact distally Intact pulses distally Dorsiflexion/Plantar flexion intact Incision: dressing C/D/I   Assessment/Plan: 1 Day Post-Op Procedure(s) (LRB): REVISION LEFT HIP ACETABULAR COMPONENT (Left) Up with therapy Will re-check Hgb/Hct in am - may need transfusion Continue PT.     Mcarthur Rossetti 11/04/2019, 4:14 PM

## 2019-11-04 NOTE — Evaluation (Addendum)
Physical Therapy Evaluation Patient Details Name: [name] MRN: [id number] DOB: 26-Jul-1950 Today's Date: 11/04/2019   History of Present Illness  69 yo male with onset of hard mechanical fall resulting in pelvic fracture around a previous L THA.  Since then has been quite painful and is admitted for revision of L acetabular component with bone graft and dual mobility to ease tendency to dislocate L hip. Permitted TDWB with transfers only.  PMHx:  avascular necrosis L hip, THA L hip, mult dislocations L hip, pelvic fracture, full length spinal fusion, BPH, bronchitis, TIA, Covid 19, CAP, HIV related neuropathy, aortic atherosclerosis, heart murmur, cerebellar ataxia,    Clinical Impression  Pt was seen for mobility including discussion of his need to have stair access to house, but will not be WB on LLE.  Pt is against the idea of rehab stay but did agree to consider CIR since he is unable to get into his house.  While he can scoot with min assist to chair, will need to be NWB on LLE to ascend 5 steps with one rail access due to stair width.  Will see over WE to consider his ability and options for re-entering his house as daughter seeks a person to install a ramp.  This will allow him to use his power scooter to enter house, and will have wc as well.    Follow Up Recommendations CIR    Equipment Recommendations  None recommended by PT    Recommendations for Other Services Rehab consult     Precautions / Restrictions Precautions Precautions: Fall Precaution Comments: TDWB on LLE Restrictions Weight Bearing Restrictions: Yes Other Position/Activity Restrictions: monitor ROM on L hip to within comfort      Mobility  Bed Mobility Overal bed mobility: Needs Assistance Bed Mobility: Supine to Sit     Supine to sit: Min assist     General bed mobility comments: min assist to assist with trunk support and set up on bed for safety to side of bed  Transfers Overall transfer level:  Needs assistance Equipment used: 1 person hand held assist Transfers: Lateral/Scoot Transfers          Lateral/Scoot Transfers: Min assist;From elevated surface General transfer comment: min assist to maintain NWB on LLE, as pt is tending to place pressure on it if possible  Ambulation/Gait             General Gait Details: deferred   Stairs            Wheelchair Mobility    Modified Rankin (Stroke Patients Only)       Balance Overall balance assessment: Needs assistance Sitting-balance support: Bilateral upper extremity supported;Feet supported Sitting balance-[name]: Fair                                       Pertinent Vitals/Pain Pain Assessment: Faces Faces Pain Scale: Hurts even more Pain Location: L hip with sudden movements Pain Descriptors / Indicators: Guarding;Grimacing;Operative site guarding Pain Intervention(s): Limited activity within patient's tolerance;Monitored during session;Premedicated before session;Repositioned;Ice applied    Home Living Family/patient expects to be discharged to:: Private residence Living Arrangements: Alone Available Help at Discharge: Family;Available PRN/intermittently Type of Home: House Home Access: Stairs to enter Entrance Stairs-Rails: Psychiatric nurse of Steps: 5 Home Layout: One level Home Equipment: Walker - 2 wheels;Cane - single point Additional Comments: has been able to walk well  until recent increase in L hip pain    Prior Function Level of Independence: Independent with assistive device(s)         Comments: mostly rollator used     Hand Dominance   Dominant Hand: Right    Extremity/Trunk Assessment   Upper Extremity Assessment Upper Extremity Assessment: Overall WFL for tasks assessed    Lower Extremity Assessment Lower Extremity Assessment: LLE deficits/detail LLE Deficits / Details: weakness in hip and intolerance for ROM LLE: Unable to fully  assess due to pain;Unable to fully assess due to immobilization LLE Coordination: decreased fine motor;decreased gross motor    Cervical / Trunk Assessment Cervical / Trunk Assessment: Kyphotic  Communication   Communication: No difficulties  Cognition Arousal/Alertness: Awake/alert Behavior During Therapy: WFL for tasks assessed/performed Overall Cognitive Status: Within Functional Limits for tasks assessed                                        General Comments General comments (skin integrity, edema, etc.): pt was seen for mobility to chair, with drop arm used to slide to chair with light support on RUE and avoidance of WB on LLE    Exercises General Exercises - Lower Extremity Ankle Circles/Pumps: AROM;5 reps Quad Sets: AROM;5 reps   Assessment/Plan    PT Assessment Patient needs continued PT services  PT Problem List Decreased strength;Decreased range of motion;Decreased activity tolerance;Decreased balance;Decreased mobility;Decreased coordination;Decreased cognition;Decreased knowledge of use of DME;Decreased safety awareness;Cardiopulmonary status limiting activity       PT Treatment Interventions DME instruction;Gait training;Stair training;Functional mobility training;Therapeutic activities;Therapeutic exercise;Balance training;Neuromuscular re-education;Patient/family education    PT Goals (Current goals can be found in the Care Plan section)  Acute Rehab PT Goals Patient Stated Goal: to get up ramp if it can be installed at home PT Goal Formulation: With patient Time For Goal Achievement: 11/18/19 Potential to Achieve Goals: Good    Frequency 7X/week   Barriers to discharge Inaccessible home environment;Decreased caregiver support      Co-evaluation               AM-PAC PT "6 Clicks" Mobility  Outcome Measure Help needed turning from your back to your side while in a flat bed without using bedrails?: A Little Help needed moving from  lying on your back to sitting on the side of a flat bed without using bedrails?: A Little Help needed moving to and from a bed to a chair (including a wheelchair)?: A Little Help needed standing up from a chair using your arms (e.g., wheelchair or bedside chair)?: A Lot Help needed to walk in hospital room?: A Lot Help needed climbing 3-5 steps with a railing? : Total 6 Click Score: 14    End of Session Equipment Utilized During Treatment: Gait belt Activity Tolerance: Patient tolerated treatment well;Treatment limited secondary to medical complications (Comment) Patient left: in chair;with call bell/phone within reach;with chair alarm set Nurse Communication: Mobility status PT Visit Diagnosis: Unsteadiness on feet (R26.81)    Time: 2595-6387 PT Time Calculation (min) (ACUTE ONLY): 38 min   Charges:   PT Evaluation $PT Eval Moderate Complexity: 1 Mod PT Treatments $Therapeutic Activity: 23-37 mins       Ramond Dial 11/04/2019, 4:57 PM  Mee Hives, PT MS Acute Rehab Dept. Number: [address]

## 2019-11-05 LAB — CBC
HCT: 26 % — ABNORMAL LOW (ref 39.0–52.0)
Hemoglobin: 8.2 g/dL — ABNORMAL LOW (ref 13.0–17.0)
MCH: 36.9 pg — ABNORMAL HIGH (ref 26.0–34.0)
MCHC: 31.5 g/dL (ref 30.0–36.0)
MCV: 117.1 fL — ABNORMAL HIGH (ref 80.0–100.0)
Platelets: 107 10*3/uL — ABNORMAL LOW (ref 150–400)
RBC: 2.22 MIL/uL — ABNORMAL LOW (ref 4.22–5.81)
RDW: 15.2 % (ref 11.5–15.5)
WBC: 11 10*3/uL — ABNORMAL HIGH (ref 4.0–10.5)
nRBC: 0 % (ref 0.0–0.2)

## 2019-11-05 MED ORDER — PANTOPRAZOLE SODIUM 40 MG PO TBEC
40.0000 mg | DELAYED_RELEASE_TABLET | Freq: Every day | ORAL | Status: DC
  Administered 2019-11-05 – 2019-11-10 (×6): 40 mg via ORAL
  Filled 2019-11-05 (×6): qty 1

## 2019-11-05 MED ORDER — BISACODYL 10 MG RE SUPP
10.0000 mg | Freq: Once | RECTAL | Status: AC
  Administered 2019-11-05: 10 mg via RECTAL
  Filled 2019-11-05: qty 1

## 2019-11-05 MED ORDER — METOCLOPRAMIDE HCL 5 MG/ML IJ SOLN
10.0000 mg | Freq: Once | INTRAMUSCULAR | Status: AC
  Administered 2019-11-05: 10 mg via INTRAVENOUS
  Filled 2019-11-05: qty 2

## 2019-11-05 NOTE — Plan of Care (Signed)
  Problem: Education: Goal: Knowledge of General Education information will improve Description: Including pain rating scale, medication(s)/side effects and non-pharmacologic comfort measures Outcome: Progressing   Problem: Pain Managment: Goal: General experience of comfort will improve Outcome: Progressing   Problem: Safety: Goal: Ability to remain free from injury will improve Outcome: Progressing   

## 2019-11-05 NOTE — Plan of Care (Signed)
  Problem: Education: Goal: Knowledge of General Education information will improve Description: Including pain rating scale, medication(s)/side effects and non-pharmacologic comfort measures 11/05/2019 0226 by Damita Dunnings, RN Outcome: Progressing 11/04/2019 2204 by Damita Dunnings, RN Outcome: Progressing   Problem: Health Behavior/Discharge Planning: Goal: Ability to manage health-related needs will improve 11/05/2019 0226 by Damita Dunnings, RN Outcome: Progressing 11/04/2019 2204 by Wilfrid Lund Cares M, RN Outcome: Progressing   Problem: Clinical Measurements: Goal: Ability to maintain clinical measurements within normal limits will improve 11/05/2019 0226 by Damita Dunnings, RN Outcome: Progressing 11/04/2019 2204 by Damita Dunnings, RN Outcome: Progressing Goal: Will remain free from infection 11/05/2019 0226 by Damita Dunnings, RN Outcome: Progressing 11/04/2019 2204 by Damita Dunnings, RN Outcome: Progressing Goal: Diagnostic test results will improve 11/05/2019 0226 by Damita Dunnings, RN Outcome: Progressing 11/04/2019 2204 by Damita Dunnings, RN Outcome: Progressing Goal: Respiratory complications will improve 11/05/2019 0226 by Damita Dunnings, RN Outcome: Progressing 11/04/2019 2204 by Damita Dunnings, RN Outcome: Progressing Goal: Cardiovascular complication will be avoided 11/05/2019 0226 by Damita Dunnings, RN Outcome: Progressing 11/04/2019 2204 by Damita Dunnings, RN Outcome: Progressing   Problem: Activity: Goal: Risk for activity intolerance will decrease 11/05/2019 0226 by Damita Dunnings, RN Outcome: Progressing 11/04/2019 2204 by Damita Dunnings, RN Outcome: Progressing   Problem: Nutrition: Goal: Adequate nutrition will be maintained 11/05/2019 0226 by Damita Dunnings, RN Outcome: Progressing 11/04/2019 2204  by Damita Dunnings, RN Outcome: Progressing   Problem: Coping: Goal: Level of anxiety will decrease 11/05/2019 0226 by Damita Dunnings, RN Outcome: Progressing 11/04/2019 2204 by Wilfrid Lund Cares M, RN Outcome: Progressing   Problem: Elimination: Goal: Will not experience complications related to bowel motility 11/05/2019 0226 by Damita Dunnings, RN Outcome: Progressing 11/04/2019 2204 by Damita Dunnings, RN Outcome: Progressing Goal: Will not experience complications related to urinary retention 11/05/2019 0226 by Damita Dunnings, RN Outcome: Progressing 11/04/2019 2204 by Damita Dunnings, RN Outcome: Progressing   Problem: Pain Managment: Goal: General experience of comfort will improve 11/05/2019 0226 by Damita Dunnings, RN Outcome: Progressing 11/04/2019 2204 by Wilfrid Lund Cares M, RN Outcome: Progressing   Problem: Safety: Goal: Ability to remain free from injury will improve 11/05/2019 0226 by Damita Dunnings, RN Outcome: Progressing 11/04/2019 2204 by Damita Dunnings, RN Outcome: Progressing   Problem: Skin Integrity: Goal: Risk for impaired skin integrity will decrease 11/05/2019 0226 by Damita Dunnings, RN Outcome: Progressing 11/04/2019 2204 by Damita Dunnings, RN Outcome: Progressing

## 2019-11-05 NOTE — Progress Notes (Signed)
Inpatient Rehab Admissions Coordinator Note:   Per therapy recommendations, pt was screened for CIR candidacy by Olanrewaju Osborn, MS CCC-SLP. At this time, Pt. Appears to have functional decline and is a good candidate for CIR. Will request order for rehab consult per protocol.  Please contact me with questions.   Belissa Kooy, MS, CCC-SLP Rehab Admissions Coordinator  [phone] (celll) [phone] (office)  

## 2019-11-05 NOTE — Progress Notes (Signed)
Patient ID: [name], male   DOB: 08-Apr-1951, 69 y.o.   MRN: [id number] The patient's hemoglobin is stable this morning at 8.2.  That is up slightly from 8 yesterday.  I will still recheck his hemoglobin again tomorrow.  He is having a little bit of chest discomfort but he says it is indigestion and not true chest pain.  He denies any shortness of breath.  His vital signs are stable.  He feels like he needs something to help get his bowels moving more.  I have ordered a Dulcolax suppository as well as some IV Reglan and will put him on Protonix.  Therapy has recommended a consultation with the rehab MD for considering inpatient rehab.  I will put in this consultation.  I agree with that assessment given the fact that we are only allowing him to put weight on his hip just to pivot into his chair or his scooter and to go to the bathroom.  Otherwise I need him to stay off of his hip to allow the acetabular component to become more stable with bone ingrowth for the next 4 to 6 weeks before allowing advancing his weightbearing.  We also want him to be as cautious as possible with hip precautions given his propensity for dislocation.

## 2019-11-06 LAB — CBC
HCT: 24 % — ABNORMAL LOW (ref 39.0–52.0)
Hemoglobin: 7.5 g/dL — ABNORMAL LOW (ref 13.0–17.0)
MCH: 36.6 pg — ABNORMAL HIGH (ref 26.0–34.0)
MCHC: 31.3 g/dL (ref 30.0–36.0)
MCV: 117.1 fL — ABNORMAL HIGH (ref 80.0–100.0)
Platelets: 91 10*3/uL — ABNORMAL LOW (ref 150–400)
RBC: 2.05 MIL/uL — ABNORMAL LOW (ref 4.22–5.81)
RDW: 14.9 % (ref 11.5–15.5)
WBC: 10.8 10*3/uL — ABNORMAL HIGH (ref 4.0–10.5)
nRBC: 0 % (ref 0.0–0.2)

## 2019-11-06 MED ORDER — SORBITOL 70 % SOLN: 30.0000 mL | Freq: Every day | Status: DC | PRN

## 2019-11-06 NOTE — PMR Pre-admission (Signed)
PMR Admission Coordinator Pre-Admission Assessment  Patient: [name] is an 69 y.o., male MRN: [id number] DOB: Nov 15, 1950 Height: '5\' 9"'$  (175.3 cm) Weight: 78.9 kg  Insurance Information HMO: PPO: PCP: IPA: 80/20: OTHER:  PRIMARY:Medicare A &  B  Policy#: [id number]: pt CM Name: Phone#: Fax#:  Pre-Cert#:verified online 6/27via passport OneEmployer: n/a Benefits: Phone #: Name:  Eff. Date:11/10/1995 A and BDeduct: $9326ZTI of Pocket Max: n/aLife Max: n/a CIR:100%SNF: 20 full days Outpatient:80%Co-Pay: 20% Home Health:100%Co-Pay:  DME:80%Co-Pay: 20% Providers:Pt choice   The "Data Collection Information Summary" for patients in Inpatient Rehabilitation Facilities with attached "Privacy Act [place] Records" was provided and verbally reviewed with: patient  Emergency Contact Information Contact Information    Name Relation Home Work Mobile   Mcgillis,Tricia Daughter [phone]        Current Medical History  Patient Admitting Diagnosis: Total Hip Revision   History of Present Illness:  69 year old right-handed male with history of chronic back pain/fibromyalgia, CLL currently on no therapy, CAD with CABG 2010, hypertension, HIV infection diagnosed 1993, TIA 1997, lateral knee surgery, patient underwent left total hip arthroplasty April of last year postoperatively he had instability and recurrent dislocations requiring several other surgeries through June of last year and in late October he fell sustaining a periprosthetic fracture around the femoral stem.  A CT scan in December showed some lucency around some of the acetabular screws.  Presented 11/03/2019 after mechanical fall without loss of consciousness.  X-rays and imaging revealed progressive loosening of left hip acetabular component.  The acetabular component had become more medialized and more  vertical after fall.  There were noted fracture lines in the pelvis on CT scan.  Patient underwent revision left hip acetabular component 11/03/2019 per Dr. Ninfa Linden.  Touchdown weightbearing left lower extremity.  Maintain on aspirin 81 mg twice daily for DVT prophylaxis.  Acute blood loss anemia 7.7.  Wound culture showed Enterococcus with infectious disease consulted Dr. Michel Bickers and placed on IV vancomycin initially and transitioned to intravenous ampicillin that he will continue on for 6 weeks before converting to oral amoxicillin.    Patient's medical record from [facility] has been reviewed by the rehabilitation admission coordinator and physician.  Past Medical History  Past Medical History:  Diagnosis Date  . Allergy   . Anxiety   . Carotid artery occlusion    40-60% right ICA stenosis (09/2008)  . Cataract   . Chronic back pain   . CLL (chronic lymphoblastic leukemia) dx 2010   Followed at [facility] q15mo no current therapy   . Clotting disorder ([facility]   . Coronary artery disease 2010   s/p CABG '10, sees Dr. HPercival Spanish . Depression   . Diverticulosis   . DVT, lower extremity, recurrent ([place] 2008, 2009   LLE, chronic anticoag since 2009  . Esophagitis   . Fibromyalgia   . Gallstones   . GERD (gastroesophageal reflux disease)   . Gout   . Gynecomastia, male   . H/O hiatal hernia 2008   surgery  . Hemorrhoids   . Hepatitis A yrs ago  . HIV infection ([place] dx 1993  . Hypertension   . Impotence of organic origin   . Myocardial infarction ([place] 2010    x 2  . Neuromuscular disorder (HCC)    neuropathy  . Osteoarthritis, knee    s/p B TKA  . Osteoporosis   . PONV (postoperative nausea and vomiting)   . Pulmonary fibrosis ([place] 2017  . Rheumatoid  arthritis(714.0) dx 2010   MTX, follows with rheum  . Seasonal allergies   . Secondary syphilis 07/24/14 dx   s/p 2 wks doxy  . Status post dilation of esophageal narrowing   . Stroke [facility]) 1969   TIA  .  TIA (transient ischemic attack) 1997   mild residual L mouth droop  . Tubular adenoma of colon     Family History   family history includes Alcohol abuse in his daughter; Asthma in his brother; Breast cancer in his mother; CVA in his brother; Colon polyps in his father; Crohn's disease in his paternal aunt; Diabetes in his brother, maternal grandmother, and mother; Drug abuse in his daughter; Heart attack in his brother and brother; Heart disease in his brother; Hyperlipidemia in his brother, father, and mother; Hypertension in his mother; Prostate cancer in his father.  Prior Rehab/Hospitalizations Has the patient had prior rehab or hospitalizations prior to admission? Yes  Has the patient had major surgery during 100 days prior to admission? Yes   Current Medications  Current Facility-Administered Medications:  .  0.9 %  sodium chloride infusion, , Intravenous, Continuous, Mcarthur Rossetti, MD, Stopped at 11/08/19 1818 .  acetaminophen (TYLENOL) tablet 325-650 mg, 325-650 mg, Oral, Q6H PRN, Mcarthur Rossetti, MD, 650 mg at 11/09/19 2146 .  ampicillin (OMNIPEN) 2 g in sodium chloride 0.9 % 100 mL IVPB, 2 g, Intravenous, Q4H, Michel Bickers, MD, Last Rate: 300 mL/hr at 11/10/19 0813, 2 g at 11/10/19 0813 .  aspirin chewable tablet 81 mg, 81 mg, Oral, BID, Mcarthur Rossetti, MD, 81 mg at 11/10/19 0811 .  atorvastatin (LIPITOR) tablet 10 mg, 10 mg, Oral, QHS, Mcarthur Rossetti, MD, 10 mg at 11/09/19 2146 .  bictegravir-emtricitabine-tenofovir AF (BIKTARVY) 50-200-25 MG per tablet 1 tablet, 1 tablet, Oral, Daily, Mcarthur Rossetti, MD, 1 tablet at 11/10/19 0809 .  Chlorhexidine Gluconate Cloth 2 % PADS 6 each, 6 each, Topical, Daily, Mcarthur Rossetti, MD, 6 each at 11/10/19 [phone] .  darunavir-cobicistat (PREZCOBIX) 800-150 MG per tablet 1 tablet, 1 tablet, Oral, Q breakfast, Mcarthur Rossetti, MD, 1 tablet at 11/10/19 0809 .  diphenhydrAMINE (BENADRYL)  12.5 MG/5ML elixir 12.5-25 mg, 12.5-25 mg, Oral, Q4H PRN, Mcarthur Rossetti, MD .  diphenhydrAMINE (BENADRYL) injection 25 mg, 25 mg, Intravenous, Once PRN, Michel Bickers, MD .  docusate sodium (COLACE) capsule 100 mg, 100 mg, Oral, BID, Mcarthur Rossetti, MD, 100 mg at 11/10/19 0809 .  edoxaban (SAVAYSA) tablet 30 mg, 30 mg, Oral, Daily, Mcarthur Rossetti, MD, 30 mg at 11/10/19 0811 .  EPINEPHrine (EPI-PEN) injection 0.3 mg, 0.3 mg, Intramuscular, Once PRN, Michel Bickers, MD .  escitalopram (LEXAPRO) tablet 20 mg, 20 mg, Oral, Daily, Mcarthur Rossetti, MD, 20 mg at 11/10/19 0809 .  ezetimibe (ZETIA) tablet 10 mg, 10 mg, Oral, QHS, Mcarthur Rossetti, MD, 10 mg at 11/09/19 2149 .  folic acid (FOLVITE) tablet 1 mg, 1 mg, Oral, Daily, Mcarthur Rossetti, MD, 1 mg at 11/10/19 0810 .  folic acid-pyridoxine-cyancobalamin (FOLTX) 2.5-25-2 MG per tablet 1 tablet, 1 tablet, Oral, BID, Blenda Nicely, [facility], 1 tablet at 11/10/19 0810 .  HYDROmorphone (DILAUDID) injection 0.5-1 mg, 0.5-1 mg, Intravenous, Q2H PRN, Mcarthur Rossetti, MD, 1 mg at 11/06/19 2101 .  leflunomide (ARAVA) tablet 20 mg, 20 mg, Oral, Daily, Mcarthur Rossetti, MD, 20 mg at 11/10/19 0810 .  menthol-cetylpyridinium (CEPACOL) lozenge 3 mg, 1 lozenge, Oral, PRN **OR** phenol (CHLORASEPTIC) mouth spray 1  spray, 1 spray, Mouth/Throat, PRN, Mcarthur Rossetti, MD .  methocarbamol (ROBAXIN) tablet 500 mg, 500 mg, Oral, Q6H PRN, 500 mg at 11/09/19 0617 **OR** methocarbamol (ROBAXIN) 500 mg in dextrose 5 % 50 mL IVPB, 500 mg, Intravenous, Q6H PRN, Mcarthur Rossetti, MD .  metoCLOPramide (REGLAN) tablet 5-10 mg, 5-10 mg, Oral, Q8H PRN **OR** metoCLOPramide (REGLAN) injection 5-10 mg, 5-10 mg, Intravenous, Q8H PRN, Mcarthur Rossetti, MD .  metoprolol succinate (TOPROL-XL) 24 hr tablet 50 mg, 50 mg, Oral, Daily, Mcarthur Rossetti, MD, 50 mg at 11/10/19 0810 .  nitroGLYCERIN  (NITROSTAT) SL tablet 0.4 mg, 0.4 mg, Sublingual, Q5 min PRN, Mcarthur Rossetti, MD .  ondansetron [facility]) tablet 4 mg, 4 mg, Oral, Q6H PRN **OR** ondansetron (ZOFRAN) injection 4 mg, 4 mg, Intravenous, Q6H PRN, Mcarthur Rossetti, MD .  oxyCODONE (Oxy IR/ROXICODONE) immediate release tablet 10-15 mg, 10-15 mg, Oral, Q4H PRN, Mcarthur Rossetti, MD, 15 mg at 11/09/19 0617 .  oxyCODONE (Oxy IR/ROXICODONE) immediate release tablet 5-10 mg, 5-10 mg, Oral, Q4H PRN, Mcarthur Rossetti, MD, 10 mg at 11/08/19 1818 .  pantoprazole (PROTONIX) EC tablet 40 mg, 40 mg, Oral, Daily, Mcarthur Rossetti, MD, 40 mg at 11/10/19 0810 .  predniSONE (DELTASONE) tablet 5 mg, 5 mg, Oral, BID, Mcarthur Rossetti, MD, 5 mg at 11/10/19 0810 .  pregabalin (LYRICA) capsule 200 mg, 200 mg, Oral, BID, Mcarthur Rossetti, MD, 200 mg at 11/10/19 0809 .  sodium chloride flush (NS) 0.9 % injection 10-40 mL, 10-40 mL, Intracatheter, PRN, Mcarthur Rossetti, MD .  sorbitol 70 % solution 30 mL, 30 mL, Oral, Daily PRN, Mcarthur Rossetti, MD .  sulfamethoxazole-trimethoprim (BACTRIM DS) 800-160 MG per tablet 1 tablet, 1 tablet, Oral, Q12H, Mcarthur Rossetti, MD, 1 tablet at 11/10/19 0810 .  sulfaSALAzine (AZULFIDINE) tablet 500 mg, 500 mg, Oral, BID, Mcarthur Rossetti, MD, 500 mg at 11/10/19 0810 .  tamsulosin (FLOMAX) capsule 0.4 mg, 0.4 mg, Oral, QPC supper, Mcarthur Rossetti, MD, 0.4 mg at 11/09/19 1828 .  torsemide (DEMADEX) tablet 20 mg, 20 mg, Oral, Daily, Mcarthur Rossetti, MD, 20 mg at 11/10/19 0809 .  valACYclovir (VALTREX) tablet 500 mg, 500 mg, Oral, Daily, Mcarthur Rossetti, MD, 500 mg at 11/10/19 7510  Patients Current Diet:  Diet Order            Diet regular Room service appropriate? Yes; Fluid consistency: Thin  Diet effective now                 Precautions / Restrictions Precautions Precautions: Fall, Posterior Hip (History of past  hip dislocations) Precaution Comments: TDWB on LLE Restrictions Weight Bearing Restrictions: Yes LLE Weight Bearing: Touchdown weight bearing Other Position/Activity Restrictions: monitor ROM on L hip to within comfort   Has the patient had 2 or more falls or a fall with injury in the past year? Yes  Prior Activity Level Community (5-7x/wk): Pt. went out for meals every day  Prior Functional Level Self Care: Did the patient need help bathing, dressing, using the toilet or eating? Independent  Indoor Mobility: Did the patient need assistance with walking from room to room (with or without device)? Independent  Stairs: Did the patient need assistance with internal or external stairs (with or without device)? Independent  Functional Cognition: Did the patient need help planning regular tasks such as shopping or remembering to take medications? [place] Devices/Equipment: Kasandra Knudsen (specify  quad or straight), Wheelchair, Eyeglasses Home Equipment: Walker - 2 wheels, Cane - single point  Prior Device Use: Indicate devices/aids used by the patient prior to current illness, exacerbation or injury? Manual wheelchair, Motorized wheelchair or scooter and Environmental consultant  Current Functional Level Cognition  Overall Cognitive Status: Within Functional Limits for tasks assessed Orientation Level: Oriented X4    Extremity Assessment (includes Sensation/Coordination)  Upper Extremity Assessment: Overall WFL for tasks assessed  Lower Extremity Assessment: LLE deficits/detail LLE Deficits / Details: weakness in hip and intolerance for ROM LLE: Unable to fully assess due to pain, Unable to fully assess due to immobilization LLE Coordination: decreased fine motor, decreased gross motor    ADLs       Mobility  Overal bed mobility: Needs Assistance Bed Mobility: Supine to Sit Supine to sit: Min guard General bed mobility comments: Pt able to complete  bed mobility min guard for safety and maintaining WB precautions    Transfers  Overall transfer level: Needs assistance Equipment used: Bilateral platform walker, 4-wheeled walker Transfers: Sit to/from Stand, Stand Pivot Transfers Sit to Stand: Min guard Stand pivot transfers: Min guard  Lateral/Scoot Transfers: Min assist, From elevated surface General transfer comment: Pt able to sit to stand and stand pivot transfer min guard, while maintaining precautions and Carley Hammed walker. Sit to stand x2.    Ambulation / Gait / Stairs / Wheelchair Mobility  Ambulation/Gait Ambulation/Gait assistance: Editor, commissioning (Feet): 1 Feet Assistive device: 4-wheeled walker, Bilateral platform walker Gait Pattern/deviations: Step-to pattern, Decreased stride length, Trunk flexed General Gait Details: Pt able to stand pivot transfer to chair, maintaining hip and WB precautions.    Posture / Balance Balance Overall balance assessment: Needs assistance Sitting-balance support: Feet supported Sitting balance-[name]: Good Standing balance support: Bilateral upper extremity supported Standing balance-[name]: Poor Standing balance comment: heavy UE support for balance    Special needs/care consideration Skin Surgical incision at L hip, abrasion on coccyx, Ecchymosis on BUE's  Designated visitor Oryon Gary (daughter) and Jesusita Oka and Daiva Huge Home on IV antibiotics for 6 weeks via PICC Ampicillin Q 4 hrs   Previous Home Environment  Living Arrangements: Alone Available Help at Discharge: Family, Available PRN/intermittently Type of Home: House Home Layout: One level Home Access: Stairs to enter Entrance Stairs-Rails: Right, Left Entrance Stairs-Number of Steps: 5 Bathroom Toilet: Standard Home Care Services: No Additional Comments: has been able to walk well until recent increase in L hip pain  Discharge Living Setting Plans for Discharge Living Setting: Patient's home Type of Home  at Discharge: Mobile home Discharge Home Layout: One level Discharge Home Access: Ramped entrance Discharge Bathroom Shower/Tub: Walk-in shower Discharge Bathroom Toilet: Handicapped height Discharge Bathroom Accessibility: Yes How Accessible: Accessible via wheelchair, Accessible via walker Does the patient have any problems obtaining your medications?: No  Social/Family/Support Systems Patient Roles: Other (Comment) Contact Information: [phone] Anticipated Caregiver: Fadel Clason (daughter) lives next door and does not currently work. She confirmed that she will stay in patient's handicapped accessible home and can provide 24/7 support.  Anticipated Caregiver's Contact Information: [phone] Ability/Limitations of Caregiver: None  Caregiver Availability: 24/7 Discharge Plan Discussed with Primary Caregiver: No  Goals Patient/Family Goal for Rehab: PT/OT Min Assist Expected length of stay: 8-10 days  Program Orientation Provided & Reviewed with Pt/Caregiver Including Roles  & Responsibilities: Yes  Decrease burden of Care through IP rehab admission: n/a  Possible need for SNF placement upon discharge: Not anticipated  Patient Condition: I have reviewed medical  records from [facility] , spoken with CM, and patient. I met with patient at the bedside for inpatient rehabilitation assessment.  Patient will benefit from ongoing PT and OT, can actively participate in 3 hours of therapy a day 5 days of the week, and can make measurable gains during the admission.  Patient will also benefit from the coordinated team approach during an Inpatient Acute Rehabilitation admission.  The patient will receive intensive therapy as well as Rehabilitation physician, nursing, social worker, and care management interventions.  Due to safety, skin/wound care, disease management, medication administration, pain management and patient education the patient requires 24 hour a day  rehabilitation nursing.  The patient is currently min-Mod A  with mobility and basic ADLs.  Discharge setting and therapy post discharge at home with home health is anticipated.  Patient has agreed to participate in the Acute Inpatient Rehabilitation Program and will admit today.  Preadmission Screen Completed By: Clemens Catholic SLP with updates by  Cleatrice Burke, 11/10/2019 10:52 AM ______________________________________________________________________   Discussed status with Dr. Ranell Patrick  on  11/10/2019  at  1055 and received approval for admission today.  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 Does the need for close, 24 hr/day Medical supervision in concert with the patient's rehab needs make it unreasonable for this patient to be served in a less intensive setting? Yes 2. Co-Morbidities requiring supervision/potential complications: CAD, Type 2 DM with diabetic polyneuropathy, HIV infection, rheumatoid arthritis, carotid artery occlusion, hyperlipidemia, ILD, failure of right total hip arthroplasty with dislocation of hip 3. Due to bladder management, bowel management, safety, skin/wound care, disease management, medication administration, pain management and patient education, does the patient require 24 hr/day rehab nursing? Yes 4. Does the patient require coordinated care of a physician, rehab nurse, PT, OT, and SLP to address physical and functional deficits in the context of the above medical diagnosis(es)? Yes Addressing deficits in the following areas: balance, endurance, locomotion, strength, transferring, bowel/bladder control, bathing, dressing, feeding, grooming, toileting, cognition and psychosocial support 5. Can the patient actively participate in an intensive therapy program of at least 3 hrs of therapy 5 days a week? Yes 6. The  potential for patient to make measurable gains while on inpatient rehab is excellent 7. Anticipated functional outcomes upon discharge from inpatient rehab: modified independent PT, modified independent OT, modified independent SLP 8. Estimated rehab length of stay to reach the above functional goals is: 10-14 days 9. Anticipated discharge destination: Home 10. Overall Rehab/Functional Prognosis: excellent   MD Signature: Leeroy Cha, MD

## 2019-11-06 NOTE — Progress Notes (Signed)
Inpatient Rehab Admissions Coordinator:   Met with patient at bedside to discuss potential CIR admission. Pt. Stated interest. Will pursue for potential admit next week, pending bed availability. Left voicemail requesting callback with Pt.'s daughter, Bright Spielmann.   Clemens Catholic, [place] Admissions Coordinator  [phone] ([place]) [phone] (office)

## 2019-11-06 NOTE — Plan of Care (Signed)

## 2019-11-06 NOTE — Progress Notes (Signed)
Patient ID: [name], male   DOB: 1950/12/12, 69 y.o.   MRN: [id number] There have been no acute changes for [name].  His left lower extremity and hip are stable thus far.  His hemoglobin this morning is 7.5.  This is acute blood loss anemia from his surgery but he is still asymptomatic.  A transfusion would be warranted only if he is symptomatic.  I will check his CBC one more time tomorrow morning.  I talked to him in detail about this.  We have also put in a consultation for CIR.

## 2019-11-06 NOTE — Plan of Care (Signed)

## 2019-11-07 ENCOUNTER — Encounter (HOSPITAL_COMMUNITY): Payer: Self-pay | Admitting: Orthopaedic Surgery

## 2019-11-07 ENCOUNTER — Ambulatory Visit: Payer: Medicare Other | Admitting: Urology

## 2019-11-07 ENCOUNTER — Inpatient Hospital Stay (HOSPITAL_COMMUNITY): Payer: Medicare Other

## 2019-11-07 ENCOUNTER — Telehealth: Payer: Self-pay | Admitting: Radiology

## 2019-11-07 LAB — CBC
HCT: 24 % — ABNORMAL LOW (ref 39.0–52.0)
Hemoglobin: 7.5 g/dL — ABNORMAL LOW (ref 13.0–17.0)
MCH: 36.2 pg — ABNORMAL HIGH (ref 26.0–34.0)
MCHC: 31.3 g/dL (ref 30.0–36.0)
MCV: 115.9 fL — ABNORMAL HIGH (ref 80.0–100.0)
Platelets: 93 10*3/uL — ABNORMAL LOW (ref 150–400)
RBC: 2.07 MIL/uL — ABNORMAL LOW (ref 4.22–5.81)
RDW: 14.5 % (ref 11.5–15.5)
WBC: 10.5 10*3/uL (ref 4.0–10.5)
nRBC: 0 % (ref 0.0–0.2)

## 2019-11-07 MED ORDER — VANCOMYCIN HCL IN DEXTROSE 1-5 GM/200ML-% IV SOLN
1000.0000 mg | Freq: Two times a day (BID) | INTRAVENOUS | Status: DC
  Administered 2019-11-08 – 2019-11-09 (×3): 1000 mg via INTRAVENOUS
  Filled 2019-11-07 (×3): qty 200

## 2019-11-07 MED ORDER — VANCOMYCIN HCL 1500 MG/300ML IV SOLN
1500.0000 mg | Freq: Once | INTRAVENOUS | Status: AC
  Administered 2019-11-07: 1500 mg via INTRAVENOUS
  Filled 2019-11-07: qty 300

## 2019-11-07 NOTE — Plan of Care (Signed)

## 2019-11-07 NOTE — Progress Notes (Signed)
Physical Therapy Treatment Patient Details Name: [name] MRN: [id number] DOB: 01-31-1951 Today's Date: 11/07/2019    History of Present Illness 69 yo male with onset of hard mechanical fall resulting in pelvic fracture around a previous L THA.  Since then has been quite painful and is admitted for revision of L acetabular component with bone graft and dual mobility to ease tendency to dislocate L hip. Permitted TDWB with transfers only.  PMHx:  avascular necrosis L hip, THA L hip, mult dislocations L hip, pelvic fracture, full length spinal fusion, BPH, bronchitis, TIA, Covid 19, CAP, HIV related neuropathy, aortic atherosclerosis, heart murmur, cerebellar ataxia,      PT Comments    Patient progressing with mobility able to perform simulated stand pivot maintaining TDWB on L with EVA walker (has UP walker at home) and noting some improved endurance able to tolerate sit to stand x 2 after rest break.  Continue to feel he will benefit from CIR stay prior to d/c home with daughter assist.  Reports ramp being put together and working on getting railing put up as well.  PT to follow acutely.   Follow Up Recommendations  CIR     Equipment Recommendations  None recommended by PT    Recommendations for Other Services       Precautions / Restrictions Precautions Precautions: Fall;Posterior Hip (history of several hip dislocations in the past) Precaution Comments: TDWB on LLE Restrictions Weight Bearing Restrictions: Yes LLE Weight Bearing: Weight bearing as tolerated (use for stand-piviot only) Other Position/Activity Restrictions: monitor ROM on L hip to within comfort    Mobility  Bed Mobility               General bed mobility comments: up in recliner  Transfers Overall transfer level: Needs assistance Equipment used: Bilateral platform walker;4-wheeled walker (EVA walker to simulate "UP" walker he has at home) Transfers: Sit to/from Stand Sit to Stand: Mod assist          General transfer comment: cues for hand placement, assist for balance, increased time to transition hands to [facility] walker lowered walker to ease transiion  Ambulation/Gait Ambulation/Gait assistance: Min assist Gait Distance (Feet): 1 Feet Assistive device: 4-wheeled walker;Bilateral platform walker Gait Pattern/deviations: Step-to pattern;Decreased stride length;Trunk flexed     General Gait Details: 2 steps fwd and back to simulate pivot transfer with [name] walker and cues for sequencing, safety, sit to stand performed x 2   Stairs             Wheelchair Mobility    Modified Rankin (Stroke Patients Only)       Balance Overall balance assessment: Needs assistance   Sitting balance-[name]: Fair     Standing balance support: Bilateral upper extremity supported Standing balance-[name]: Poor Standing balance comment: heavy UE support for balance                            Cognition Arousal/Alertness: Awake/alert Behavior During Therapy: WFL for tasks assessed/performed Overall Cognitive Status: Within Functional Limits for tasks assessed                                        Exercises Total Joint Exercises Ankle Circles/Pumps: AROM;Both;10 reps;Seated Towel Squeeze: AROM;Both;5 reps;Seated Short Arc Quad: AROM;Both;5 reps;Seated Heel Slides: AROM;AAROM;Both;5 reps;10 reps;Seated    General Comments  Pertinent Vitals/Pain Pain Assessment: 0-10 Pain Score: 5  Pain Location: L hip with sudden movements Pain Descriptors / Indicators: Guarding;Grimacing;Operative site guarding Pain Intervention(s): Monitored during session;Repositioned    Home Living                      Prior Function            PT Goals (current goals can now be found in the care plan section) Progress towards PT goals: Progressing toward goals    Frequency    Min 4X/week      PT Plan Current plan remains appropriate     Co-evaluation              AM-PAC PT "6 Clicks" Mobility   Outcome Measure  Help needed turning from your back to your side while in a flat bed without using bedrails?: A Little Help needed moving from lying on your back to sitting on the side of a flat bed without using bedrails?: A Little Help needed moving to and from a bed to a chair (including a wheelchair)?: A Little Help needed standing up from a chair using your arms (e.g., wheelchair or bedside chair)?: A Lot Help needed to walk in hospital room?: A Lot Help needed climbing 3-5 steps with a railing? : Total 6 Click Score: 14    End of Session Equipment Utilized During Treatment: Gait belt Activity Tolerance: Patient tolerated treatment well Patient left: in chair;with call bell/phone within reach   PT Visit Diagnosis: Muscle weakness (generalized) (M62.81);Other abnormalities of gait and mobility (R26.89);Difficulty in walking, not elsewhere classified (R26.2);Pain Pain - Right/Left: Left Pain - part of body: Hip     Time: [phone] (some time obtaining equipment) PT Time Calculation (min) (ACUTE ONLY): 42 min  Charges:  $Therapeutic Exercise: 8-22 mins $Therapeutic Activity: 8-22 mins                     Magda Kiel, PT Acute Rehabilitation Services [facility]HCW:[phone] Office:[phone] 11/07/2019    Reginia Naas 11/07/2019, 12:08 PM

## 2019-11-07 NOTE — Plan of Care (Signed)

## 2019-11-07 NOTE — Progress Notes (Signed)
Patient ID: [name], male   DOB: 05/15/1950, 69 y.o.   MRN: [id number] The patient is stable clinically and medically.  His hemoglobin is 7.5 which is the same as yesterday and he is asymptomatic with normal vital signs.  I did change his left hip dressing today.  At this point, he is certainly a candidate for CIR and a consultation has been placed with the rehab specialist MD.  From an orthopedic standpoint, he can be discharged to inpatient rehab once a bed is available and that has been coordinated through the rehab coordinator and their father's insurance.  He remains upbeat and highly motivated.  I still want him only putting weight on his left hip just to pivot to his scooter or to stand for the bathroom.  And need his toes still pointing for the most part.

## 2019-11-07 NOTE — Progress Notes (Signed)
Patient ID: [name], male   DOB: Jan 09, 1951, 69 y.o.   MRN: [id number] The patient's culture from his left hip surgery today grew out rare Enterococcus.  He has been on chronic Bactrim for other reasons which may be related to his HIV status and has been placed on this for a long period of time not by me.  At the time of surgery last week, his hip did not look worrisome for infection and the original intraoperative Gram stain showed rare white blood cells and no organisms.  I am uncertain as to whether or not this is a contaminant or not.  With that being said, I am still going to have pharmacy start vancomycin on him and likely tomorrow I will obtain an ID consult.  I am going to repeat his CBC and BMET in the morning.

## 2019-11-07 NOTE — Telephone Encounter (Signed)
Inpatient at [facility] called Dr. Louanne Skye (practice call) stated patient is growing bacteria not sensitive to bactrim. Patient has allergy to penicillin. Pharmacy recommending vancomycin or infectious disease referral.  Please advise.

## 2019-11-07 NOTE — Progress Notes (Signed)
Pharmacy Antibiotic Note  [name] is a 69 y.o. male admitted on 11/03/2019 with hip wound - culture growing enterococcus sensitive to Ampicillin.  Pharmacy has been consulted for Vancomycin dosing.  Plan: Vancomycin 1500 mg iv x 1 dose now then 1 gram iv Q 12 Follow up ID consult, Scr  Height: 5\' 9"  (175.3 cm) Weight: 78.9 kg (174 lb) IBW/kg (Calculated) : 70.7  Temp (24hrs), Avg:98.8 F (37.1 C), Min:98.4 F (36.9 C), Max:99.3 F (37.4 C)  Recent Labs  Lab 11/01/19 1033 11/04/19 0344 11/05/19 0759 11/06/19 0508 11/07/19 0417  WBC 12.4* 10.6* 11.0* 10.8* 10.5  CREATININE 0.99 0.75  --   --   --     Estimated Creatinine Clearance: 84.7 mL/min (by C-G formula based on SCr of 0.75 mg/dL).    Allergies  Allergen Reactions  . Golimumab Anaphylaxis    Simponi ARIA  . Orencia [Abatacept] Anaphylaxis  . Other Anaphylaxis and Hives    Pecans  . Peanut-Containing Drug Products Anaphylaxis, Hives and Swelling    Swelling of throat  . Morphine Other (See Comments)    Severe headache- Can tolerate Dilaudid, however  . Oxycodone-Acetaminophen Other (See Comments)    Headache 6.22.2020 patient is currently taking and tolerating  . Penicillins Rash and Other (See Comments)    FLUSHED & RED, also Has patient had a PCN reaction causing immediate rash, facial/tongue/throat swelling, SOB or lightheadedness with hypotension: Yes Has patient had a PCN reaction causing severe rash involving mucus membranes or skin necrosis: No Has patient had a PCN reaction that required hospitalization: No Has patient had a PCN reaction occurring within the last 10 years: No If all of the above answers are "NO", then may proceed with Cephalosporin use  . Promethazine Hcl Other (See Comments)    Makes him feel "drunk" at higher strengths   Thank you for allowing pharmacy to be a part of this patient's care. Anette Guarneri, PharmD 11/07/2019 6:07 PM

## 2019-11-07 NOTE — Progress Notes (Signed)
Patient ID: [name], male   DOB: 11/28/1950, 69 y.o.   MRN: [id number] The patient is continuing to complain of chest pain when he breathes.  Yesterday he felt this may be related to and just not having a bowel movement.  He is currently eating his dinner.  He does not appear in any labored breathing and does not think short of breath.  His vital signs are completely stable and his oxygen saturation on room air is normal.  I will obtain at least a chest x-ray to rule out pneumonia.  If he continues to experience this type of discomfort, I may consider a CTA.  I will repeat his labs in the morning.  I did speak with him about needing to start IV antibiotics due to the recent culture showing Enterococcus.

## 2019-11-07 NOTE — Progress Notes (Signed)
Inpatient Rehab Admissions Coordinator:    I do not have a CIR bed available for patient today. Discussed with pt. And he is understanding. Will follow for potential admit tomorrow or later this week.   Clemens Catholic, [place], [place] Admissions Coordinator  [phone] ([place]) [phone] (office)

## 2019-11-08 ENCOUNTER — Inpatient Hospital Stay: Payer: Self-pay

## 2019-11-08 DIAGNOSIS — T8452XA Infection and inflammatory reaction due to internal left hip prosthesis, initial encounter: Secondary | ICD-10-CM | POA: Diagnosis present

## 2019-11-08 DIAGNOSIS — T8452XD Infection and inflammatory reaction due to internal left hip prosthesis, subsequent encounter: Secondary | ICD-10-CM

## 2019-11-08 LAB — BASIC METABOLIC PANEL
Anion gap: 7 (ref 5–15)
BUN: 10 mg/dL (ref 8–23)
CO2: 27 mmol/L (ref 22–32)
Calcium: 7.7 mg/dL — ABNORMAL LOW (ref 8.9–10.3)
Chloride: 102 mmol/L (ref 98–111)
Creatinine, Ser: 0.68 mg/dL (ref 0.61–1.24)
GFR calc Af Amer: >60 mL/min (ref >60)
GFR calc non Af Amer: >60 mL/min (ref >60)
Glucose, Bld: 116 mg/dL — ABNORMAL HIGH (ref 70–99)
Potassium: 3.5 mmol/L (ref 3.5–5.1)
Sodium: 136 mmol/L (ref 135–145)

## 2019-11-08 LAB — CBC
HCT: 24.7 % — ABNORMAL LOW (ref 39.0–52.0)
Hemoglobin: 7.7 g/dL — ABNORMAL LOW (ref 13.0–17.0)
MCH: 36.2 pg — ABNORMAL HIGH (ref 26.0–34.0)
MCHC: 31.2 g/dL (ref 30.0–36.0)
MCV: 116 fL — ABNORMAL HIGH (ref 80.0–100.0)
Platelets: 97 10*3/uL — ABNORMAL LOW (ref 150–400)
RBC: 2.13 MIL/uL — ABNORMAL LOW (ref 4.22–5.81)
RDW: 14.3 % (ref 11.5–15.5)
WBC: 9.2 10*3/uL (ref 4.0–10.5)
nRBC: 0 % (ref 0.0–0.2)

## 2019-11-08 LAB — AEROBIC/ANAEROBIC CULTURE W GRAM STAIN (SURGICAL/DEEP WOUND)

## 2019-11-08 MED ORDER — DIPHENHYDRAMINE HCL 50 MG/ML IJ SOLN
25.0000 mg | Freq: Once | INTRAMUSCULAR | Status: DC | PRN
  Filled 2019-11-08: qty 1

## 2019-11-08 MED ORDER — EPINEPHRINE 0.3 MG/0.3ML IJ SOAJ
0.3000 mg | Freq: Once | INTRAMUSCULAR | Status: DC | PRN
  Filled 2019-11-08 (×2): qty 0.6

## 2019-11-08 MED ORDER — SODIUM CHLORIDE 0.9% FLUSH: 10.0000 mL | INTRAVENOUS | Status: DC | PRN

## 2019-11-08 MED ORDER — AMOXICILLIN 500 MG PO CAPS
500.0000 mg | ORAL_CAPSULE | Freq: Once | ORAL | Status: AC
  Administered 2019-11-08: 500 mg via ORAL
  Filled 2019-11-08: qty 1

## 2019-11-08 MED ORDER — CHLORHEXIDINE GLUCONATE CLOTH 2 % EX PADS
6.0000 | MEDICATED_PAD | Freq: Every day | CUTANEOUS | Status: DC
  Administered 2019-11-09 – 2019-11-10 (×2): 6 via TOPICAL

## 2019-11-08 NOTE — Progress Notes (Signed)
Pharmacy Penicillin Allergy Assessment   History of Beta-Lactam Allergy: Mr. [name] reports that he had a reaction to what he thinks was penicillin back in 1986 when he had jaw surgery. Her reports that he became very flushed and warm at that time but did not have any shortness of breath or swelling. Mr. Muchow states that he did not have to go to the hospital or to the doctor's office for the reaction.   When listing off other common beta-lactam antibiotics, Mr. Ellerby noted that he believes he has taken amoxicillin several times in the past without having any problems.   Given his remote history of allergy and reaction, he is a good candidate for an amoxicillin challenge. Mr. Banh is agreeable to trying amoxicillin while in the hospital.   I have discussed the challenge with both Mr. Speckman and Roj, his nurse.   Plan Amoxicillin 500 mg X 1 at 1700 Monitor vitals every 15 minutes for 1 hour post dose Benadryl and an EpiPen will be available as needed Monitor for reactions and will consider switching to IV ampicillin tomorrow if he tolerates     Jimmy Footman, PharmD, BCPS, [place] [facility] Clinical Pharmacist Phone: [phone] 11/08/2019 2:54 PM

## 2019-11-08 NOTE — Progress Notes (Addendum)
Physical Therapy Treatment Patient Details Name: [name] MRN: [id number] DOB: Dec 24, 1950 Today's Date: 11/08/2019    History of Present Illness 69 yo male with onset of hard mechanical fall resulting in pelvic fracture around a previous L THA.  Since then has been quite painful and is admitted for revision of L acetabular component with bone graft and dual mobility to ease tendency to dislocate L hip. Permitted TDWB with transfers only.  PMHx:  avascular necrosis L hip, THA L hip, mult dislocations L hip, pelvic fracture, full length spinal fusion, BPH, bronchitis, TIA, Covid 19, CAP, HIV related neuropathy, aortic atherosclerosis, heart murmur, cerebellar ataxia,      PT Comments    Patient progressing with ease of transitions for sit to stand from EOB (higher surface) and with supine to sit toward involved leg.  Patient with copious drainage from the hip this pm and RN aware.  PT to follow acutely.  Continue to recommend follow up CIR prior to home to work on transitions, safety, balance and endurance.    Follow Up Recommendations  CIR     Equipment Recommendations  None recommended by PT    Recommendations for Other Services       Precautions / Restrictions Precautions Precautions: Fall;Posterior Hip (history of several hip dislocations in the past) Restrictions Weight Bearing Restrictions: Yes LLE Weight Bearing: Weight bearing as tolerated (for transfers only)    Mobility  Bed Mobility Overal bed mobility: Needs Assistance Bed Mobility: Supine to Sit     Supine to sit: Min assist     General bed mobility comments: heavy UE use and increased time needed, assist for L LE  Transfers Overall transfer level: Needs assistance Equipment used: Bilateral platform walker;4-wheeled walker (EVA walker to simulate UP walker he has at home) Transfers: Sit to/from [employer] to Stand: Min assist;From elevated surface Stand pivot transfers: Min assist        General transfer comment: up from slightly elevated bed min A for balance during transition; stepping to recliner with EVA walker and min A for safety  Ambulation/Gait                 Stairs             Wheelchair Mobility    Modified Rankin (Stroke Patients Only)       Balance Overall balance assessment: Needs assistance Sitting-balance support: Feet supported Sitting balance-[name]: Fair     Standing balance support: Bilateral upper extremity supported Standing balance-[name]: Poor Standing balance comment: heavy UE support for balance                            Cognition Arousal/Alertness: Awake/alert Behavior During Therapy: WFL for tasks assessed/performed Overall Cognitive Status: Within Functional Limits for tasks assessed                                        Exercises Total Joint Exercises Ankle Circles/Pumps: AROM;10 reps;Both;Supine Towel Squeeze: AROM;Both;5 reps;Supine Short Arc Quad: AROM;Supine;10 reps;Left Heel Slides: AROM;AAROM;Both;5 reps;10 reps;Supine Long Arc Quad: AROM;Left;10 reps;Seated    General Comments General comments (skin integrity, edema, etc.): serosanginous drainage oozing from bottom of mepilex dressing onto bed mat under pt, RN in to reinforce dressing prior to mobility      Pertinent Vitals/Pain Pain Assessment: Faces Faces Pain Scale: Hurts  little more Pain Location: L hip with sudden movements Pain Descriptors / Indicators: Guarding;Grimacing;Operative site guarding Pain Intervention(s): Monitored during session;Repositioned    Home Living                      Prior Function            PT Goals (current goals can now be found in the care plan section) Progress towards PT goals: Progressing toward goals    Frequency    Min 4X/week      PT Plan Current plan remains appropriate    Co-evaluation              AM-PAC PT "6 Clicks"  Mobility   Outcome Measure  Help needed turning from your back to your side while in a flat bed without using bedrails?: A Little Help needed moving from lying on your back to sitting on the side of a flat bed without using bedrails?: A Little Help needed moving to and from a bed to a chair (including a wheelchair)?: A Little Help needed standing up from a chair using your arms (e.g., wheelchair or bedside chair)?: A Little Help needed to walk in hospital room?: Total Help needed climbing 3-5 steps with a railing? : Total 6 Click Score: 14    End of Session Equipment Utilized During Treatment: Gait belt Activity Tolerance: Patient tolerated treatment well Patient left: in chair;with call bell/phone within reach   PT Visit Diagnosis: Muscle weakness (generalized) (M62.81);Other abnormalities of gait and mobility (R26.89);Difficulty in walking, not elsewhere classified (R26.2);Pain Pain - Right/Left: Left Pain - part of body: Hip     Time: 1121-6244 PT Time Calculation (min) (ACUTE ONLY): 37 min  Charges:  $Therapeutic Exercise: 8-22 mins $Therapeutic Activity: 8-22 mins                     Magda Kiel, PT Acute Rehabilitation Services CXFQH:[phone] [phone] 11/08/2019    Reginia Naas 11/08/2019, 5:44 PM

## 2019-11-08 NOTE — Progress Notes (Signed)
Inpatient Rehabilitation Admissions Coordinator  I met with patient at bedside to review goals and expectations of a possible CIR admit. He is in agreement. I await medical clearance to admit.  Danne Baxter, RN, MSN Rehab Admissions Coordinator [phone] 11/08/2019 2:45 PM

## 2019-11-08 NOTE — Progress Notes (Signed)
Peripherally Inserted Central Catheter Placement  The IV Nurse has discussed with the patient and/or persons authorized to consent for the patient, the purpose of this procedure and the potential benefits and risks involved with this procedure.  The benefits include less needle sticks, lab draws from the catheter, and the patient may be discharged home with the catheter. Risks include, but not limited to, infection, bleeding, blood clot (thrombus formation), and puncture of an artery; nerve damage and irregular heartbeat and possibility to perform a PICC exchange if needed/ordered by physician.  Alternatives to this procedure were also discussed.  Bard Power PICC patient education guide, fact sheet on infection prevention and patient information card has been provided to patient /or left at bedside.    PICC Placement Documentation  PICC Single Lumen 11/08/19 PICC Right Brachial 39 cm 0 cm (Active)  Indication for Insertion or Continuance of Line Home intravenous therapies (PICC only) 11/08/19 2203  Exposed Catheter (cm) 0 cm 11/08/19 2203  Site Assessment Clean;Dry;Intact 11/08/19 2203  Line Status Flushed;Saline locked;Blood return noted 11/08/19 2203  Dressing Type Transparent 11/08/19 2203  Dressing Status Clean;Dry;Intact 11/08/19 2203  Dressing Intervention New dressing 11/08/19 2203  Dressing Change Due 11/15/19 11/08/19 2203       Cagney Steenson, Nicolette Bang 11/08/2019, 10:04 PM

## 2019-11-08 NOTE — Progress Notes (Signed)
Patient ID: [name], male   DOB: 20-Mar-1951, 69 y.o.   MRN: [id number] [name] reports that he feels well overall.  He denies any significant chest discomfort.  His chest x-ray did not show any evidence of pneumonia.  He denies any shortness of breath.  His vital signs are stable and his oxygen sats are normal on room air.  His left hip incision does show some bloody drainage which is to be expected.  His hemoglobin is stable at 7.7 which is asymptomatic acute blood loss anemia.  Given the culture are growing out some Enterococcus from his left hip intraoperative culture, I do feel it is worthwhile to obtain an infectious disease consult for further assessment and recommendations.  Of note, at the time of surgery did not find any gross purulence but there was certainly fluid collection and I felt comfortable sending off cultures.  I do not plan on any further surgery at this time.  He should be able to continue to eventually rehab in inpatient rehab once that is established.

## 2019-11-08 NOTE — Plan of Care (Signed)

## 2019-11-08 NOTE — Progress Notes (Signed)
Completed every 15 minutes vital signs for 1 hour post dose, no reactions noted, Pt states he feels okay.

## 2019-11-08 NOTE — Consult Note (Signed)
[facility] for Infectious [facility]    Date of Admission:  11/03/2019          Reason for Consult: Prosthetic hip infection    Referring Provider: Dr. 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  I will order PICC placement and start him on IV vancomycin given his history of penicillin allergy.  He thinks that he may have been able to tolerate amoxicillin.  I will ask our ID pharmacist, Jimmy Footman, to talk to him about a challenge dose of amoxicillin while he is here.  If he tolerates that then we can treat him with IV ampicillin.  Plan: 1. PICC placement 2. Start IV vancomycin 3. 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 aspirin  81 mg Oral BID  . atorvastatin  10 mg Oral QHS  . bictegravir-emtricitabine-tenofovir AF  1 tablet Oral Daily  . darunavir-cobicistat  1 tablet Oral Q breakfast  . docusate sodium  100 mg Oral BID  . edoxaban  30 mg Oral Daily  . escitalopram  20 mg Oral Daily  . ezetimibe  10 mg Oral QHS  . folic acid  1 mg Oral Daily  . folic acid-pyridoxine-cyancobalamin  1 tablet Oral BID  . leflunomide  20 mg Oral Daily  . metoprolol succinate  50 mg Oral Daily  . pantoprazole  40 mg Oral Daily  . predniSONE  5 mg Oral BID  . pregabalin  200 mg Oral BID  . sulfamethoxazole-trimethoprim  1 tablet Oral Q12H  . sulfaSALAzine   500 mg Oral BID  . tamsulosin  0.4 mg Oral QPC supper  . torsemide  20 mg Oral Daily  . valACYclovir  500 mg Oral Daily   Continuous Infusions: . sodium chloride Stopped (11/08/19 0843)  . methocarbamol (ROBAXIN) IV    . vancomycin 200 mL/hr at 11/08/19 0900   PRN Meds:.acetaminophen, diphenhydrAMINE, HYDROmorphone (DILAUDID) injection, menthol-cetylpyridinium **OR** phenol, methocarbamol **OR** methocarbamol (ROBAXIN) IV, metoCLOPramide **OR** metoCLOPramide (REGLAN) injection, nitroGLYCERIN, ondansetron **OR** ondansetron (ZOFRAN) IV, oxyCODONE, oxyCODONE, sorbitol  HPI: [name] is a 69 y.o. male who had avascular necrosis of his left hip.  He underwent left total hip arthroplasty in April of last year.  Postoperatively he had instability and recurrent dislocations requiring several other surgeries through June of last year.  In late October he fell and sustained a periprosthetic fracture around the femoral stem.  A CT scan in December showed some lucency around some of the acetabular screws.  He has had increasing pain in his left hip.  He underwent revision arthroplasty on 11/03/2019.  The acetabular screws were loose.  Tissue and fluid were sent for stain and culture.  No organisms were seen on Gram stain and culture has grown Enterococcus.  He has developed fever, chills and sweats here in the hospital.  He has  well-controlled HIV infection.  He is on immunomodulator therapy for interstitial lung disease.   Review of Systems: Review of Systems  Constitutional: Positive for chills, diaphoresis, fever and malaise/fatigue.  Gastrointestinal: Negative for abdominal pain, diarrhea, nausea and vomiting.  Musculoskeletal: Positive for joint pain.    Past Medical History:  Diagnosis Date  . Allergy   . Anxiety   . Carotid artery occlusion    40-60% right ICA stenosis (09/2008)  . Cataract   . Chronic back pain   . CLL (chronic lymphoblastic leukemia) dx 2010   Followed at [facility]  q43mo, no current therapy   . Clotting disorder ([place])   . Coronary artery disease 2010   s/p CABG '10, sees Dr. Percival Spanish  . Depression   . Diverticulosis   . DVT, lower extremity, recurrent ([place]) 2008, 2009   LLE, chronic anticoag since 2009  . Esophagitis   . Fibromyalgia   . Gallstones   . GERD (gastroesophageal reflux disease)   . Gout   . Gynecomastia, male   . H/O hiatal hernia 2008   surgery  . Hemorrhoids   . Hepatitis A yrs ago  . HIV infection ([place]) dx 1993  . Hypertension   . Impotence of organic origin   . Myocardial infarction ([place]) 2010    x 2  . Neuromuscular disorder (HCC)    neuropathy  . Osteoarthritis, knee    s/p B TKA  . Osteoporosis   . PONV (postoperative nausea and vomiting)   . Pulmonary fibrosis ([place]) 2017  . Rheumatoid arthritis(714.0) dx 2010   MTX, follows with rheum  . Seasonal allergies   . Secondary syphilis 07/24/14 dx   s/p 2 wks doxy  . Status post dilation of esophageal narrowing   . Stroke [facility]) 1969   TIA  . TIA (transient ischemic attack) 1997   mild residual L mouth droop  . Tubular adenoma of colon     Social History   Tobacco Use  . Smoking status: Never Smoker  . Smokeless tobacco: Never Used  . Tobacco comment: occ wine  Vaping Use  . Vaping Use: Never used  Substance Use Topics  . Alcohol use: Not Currently    Comment: occasional wine- 1-2 per week  . Drug use: Not Currently    Types: Marijuana    Comment: 1 month ago    Family History  Problem Relation Age of Onset  . Breast cancer Mother   . Hypertension Mother   . Hyperlipidemia Mother   . Diabetes Mother   . Prostate cancer Father   . Colon polyps Father   . Hyperlipidemia Father   . Crohn's disease Paternal Aunt   . Diabetes Maternal Grandmother   . Diabetes Brother        x 3  . Heart attack Brother   . Heart disease Brother        x 3  . Heart attack Brother   . Hyperlipidemia Brother        x 3  . Alcohol abuse Daughter   . Drug abuse  Daughter   . Asthma Brother   . CVA Brother   . Colon cancer Neg Hx   . Esophageal cancer Neg Hx   . Rectal cancer Neg Hx   . Stomach cancer Neg Hx    Allergies  Allergen Reactions  . Golimumab Anaphylaxis    Simponi ARIA  . Orencia [Abatacept] Anaphylaxis  . Other Anaphylaxis and Hives    Pecans  . Peanut-Containing  Drug Products Anaphylaxis, Hives and Swelling    Swelling of throat  . Morphine Other (See Comments)    Severe headache- Can tolerate Dilaudid, however  . Oxycodone-Acetaminophen Other (See Comments)    Headache 6.22.2020 patient is currently taking and tolerating  . Penicillins Rash and Other (See Comments)    FLUSHED & RED, also Has patient had a PCN reaction causing immediate rash, facial/tongue/throat swelling, SOB or lightheadedness with hypotension: Yes Has patient had a PCN reaction causing severe rash involving mucus membranes or skin necrosis: No Has patient had a PCN reaction that required hospitalization: No Has patient had a PCN reaction occurring within the last 10 years: No If all of the above answers are "NO", then may proceed with Cephalosporin use  . Promethazine Hcl Other (See Comments)    Makes him feel "drunk" at higher strengths    OBJECTIVE: Blood pressure (!) 104/56, pulse 77, temperature 97.9 F (36.6 C), temperature source Oral, resp. rate 16, height 5\' 9"  (1.753 m), weight 78.9 kg, SpO2 92 %.  Physical Exam Constitutional:      Comments: He is resting quietly in bed.  He is talkative and in good spirits.  Cardiovascular:     Rate and Rhythm: Normal rate and regular rhythm.     Heart sounds: No murmur heard.   Pulmonary:     Effort: Pulmonary effort is normal.     Breath sounds: Normal breath sounds.  Musculoskeletal:     Comments: Clean dressing over left hip incision.  Psychiatric:        Mood and Affect: Mood normal.     Lab Results Lab Results  Component Value Date   WBC 9.2 11/08/2019   HGB 7.7 (L) 11/08/2019   HCT  24.7 (L) 11/08/2019   MCV 116.0 (H) 11/08/2019   PLT 97 (L) 11/08/2019    Lab Results  Component Value Date   CREATININE 0.68 11/08/2019   BUN 10 11/08/2019   NA 136 11/08/2019   K 3.5 11/08/2019   CL 102 11/08/2019   CO2 27 11/08/2019    Lab Results  Component Value Date   ALT 12 08/16/2019   AST 16 08/16/2019   ALKPHOS 96 08/16/2019   BILITOT 0.7 08/16/2019     Microbiology: Recent Results (from the past 240 hour(s))  Surgical pcr screen     Status: None   Collection Time: 11/01/19 10:33 AM   Specimen: Nasal Mucosa; Nasal Swab  Result Value Ref Range Status   MRSA, PCR NEGATIVE NEGATIVE Final   Staphylococcus aureus NEGATIVE NEGATIVE Final    Comment: (NOTE) The Xpert SA Assay (FDA approved for NASAL specimens in patients 74 years of age and older), is one component of a comprehensive surveillance program. It is not intended to diagnose infection nor to guide or monitor treatment. Performed at [facility], [place] [address], [place] , [place] [address]   SARS CORONAVIRUS 2 (TAT 6-24 HRS) Nasopharyngeal Nasopharyngeal Swab     Status: None   Collection Time: 11/01/19 11:28 AM   Specimen: Nasopharyngeal Swab  Result Value Ref Range Status   SARS Coronavirus 2 NEGATIVE NEGATIVE Final    Comment: (NOTE) SARS-CoV-2 target nucleic acids are NOT DETECTED.  The SARS-CoV-2 RNA is generally detectable in upper and lower respiratory specimens during the acute phase of infection. Negative results do not preclude SARS-CoV-2 infection, do not rule out co-infections with other pathogens, and should not be used as the sole basis for treatment or other patient management decisions.  Negative results must be combined with clinical observations, patient history, and epidemiological information. The expected result is Negative.  Fact Sheet for Patients: SugarRoll.be  Fact Sheet for Healthcare  Providers: https://www.woods-mathews.com/  This test is not yet approved or cleared by the [place] FDA and  has been authorized for detection and/or diagnosis of SARS-CoV-2 by FDA under an Emergency Use Authorization (EUA). This EUA will remain  in effect (meaning this test can be used) for the duration of the COVID-19 declaration under Se ction 564(b)(1) of the Act, 21 U.S.C. section 360bbb-3(b)(1), unless the authorization is terminated or revoked sooner.  Performed at [facility], [place] [address], [place], [place] [address]   Aerobic/Anaerobic Culture (surgical/deep wound)     Status: None   Collection Time: 11/03/19  8:23 AM   Specimen: PATH Other; Body Fluid  Result Value Ref Range Status   Specimen Description WOUND LEFT HIP REVISION ACETABULAR  Final   Special Requests A  Final   Gram Stain   Final    RARE WBC PRESENT, PREDOMINANTLY PMN NO ORGANISMS SEEN RESULT CALLED TO, READ BACK BY AND VERIFIED WITH: DR  Ninfa Linden [phone] [id number] FCP    Culture   Final    RARE ENTEROCOCCUS FAECALIS NO ANAEROBES ISOLATED Performed at [facility], [place]. [address] [place], [place] [address]    Report Status 11/08/2019 FINAL  Final   Organism ID, Bacteria ENTEROCOCCUS FAECALIS  Final      Susceptibility   Enterococcus faecalis - MIC*    AMPICILLIN <=2 SENSITIVE Sensitive     VANCOMYCIN <=0.5 SENSITIVE Sensitive     GENTAMICIN SYNERGY RESISTANT Resistant     * RARE ENTEROCOCCUS FAECALIS    Michel Bickers, [facility] [phone] pager   [phone] cell 11/08/2019, 12:40 PM

## 2019-11-09 ENCOUNTER — Telehealth: Payer: Self-pay | Admitting: Internal Medicine

## 2019-11-09 MED ORDER — SODIUM CHLORIDE 0.9 % IV SOLN
2.0000 g | INTRAVENOUS | Status: DC
  Administered 2019-11-09 – 2019-11-10 (×6): 2 g via INTRAVENOUS
  Filled 2019-11-09 (×11): qty 2000

## 2019-11-09 MED ORDER — AMPICILLIN IV (FOR PTA / DISCHARGE USE ONLY)
12.0000 g | INTRAVENOUS | 0 refills | Status: DC
Start: 2019-11-09 — End: 2019-11-16

## 2019-11-09 MED ORDER — FLUCONAZOLE 150 MG PO TABS
150.0000 mg | ORAL_TABLET | Freq: Once | ORAL | Status: AC
  Administered 2019-11-09: 150 mg via ORAL
  Filled 2019-11-09: qty 1

## 2019-11-09 NOTE — Progress Notes (Signed)
Nurse reinforced patient dressing with ABD and tape because it was bleeding , surgical area swollen a little, fresh ice place on it, day shift nurse updated, will continue to monitor.

## 2019-11-09 NOTE — Progress Notes (Signed)
Patient ID: [name], male   DOB: Dec 02, 1950, 69 y.o.   MRN: [id number]         [facility] for Infectious Disease  Date of Admission:  11/03/2019           Day 2 vancomycin ASSESSMENT: He has smoldering enterococcal infection of his left prosthetic hip.  He has developed some fevers here in the hospital which I suspect was stirred up by his hip surgery.  He tolerated amoxicillin so I will change vancomycin to IV ampicillin.  I plan on 6 weeks of therapy before he converts over to oral amoxicillin.  PLAN: 1. Change vancomycin to IV ampicillin 2. I have arranged follow-up in our clinic and will sign off now  Diagnosis: Prosthetic hip infection  Culture Result: Enterococcus  Allergies  Allergen Reactions  . Golimumab Anaphylaxis    Simponi ARIA  . Orencia [Abatacept] Anaphylaxis  . Other Anaphylaxis and Hives    Pecans  . Peanut-Containing Drug Products Anaphylaxis, Hives and Swelling    Swelling of throat  . Morphine Other (See Comments)    Severe headache- Can tolerate Dilaudid, however  . Oxycodone-Acetaminophen Other (See Comments)    Headache 6.22.2020 patient is currently taking and tolerating  . Promethazine Hcl Other (See Comments)    Makes him feel "drunk" at higher strengths    OPAT Orders Discharge antibiotics to be given via PICC line Discharge antibiotics: Per pharmacy protocol ampicillin  Duration: 6 weeks End Date: 12/20/2019  [facility] Care Per Protocol:  Home health RN for IV administration and teaching; PICC line care and labs.    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 aspirin  81 mg Oral BID  . atorvastatin  10 mg Oral QHS  . bictegravir-emtricitabine-tenofovir AF  1 tablet Oral Daily  . Chlorhexidine Gluconate Cloth  6 each Topical Daily  . darunavir-cobicistat  1 tablet Oral Q breakfast  . docusate sodium  100 mg Oral BID  . edoxaban  30 mg Oral Daily  . escitalopram  20 mg Oral Daily  . ezetimibe  10 mg Oral QHS  . fluconazole  150 mg Oral Once  . folic acid  1 mg Oral Daily  . folic acid-pyridoxine-cyancobalamin  1 tablet Oral BID  . leflunomide  20 mg Oral Daily  . metoprolol succinate  50 mg Oral Daily  . pantoprazole  40 mg Oral Daily  . predniSONE  5 mg Oral BID  . pregabalin  200 mg Oral BID  . sulfamethoxazole-trimethoprim  1 tablet Oral Q12H  . sulfaSALAzine  500 mg Oral BID  . tamsulosin  0.4  mg Oral QPC supper  . torsemide  20 mg Oral Daily  . valACYclovir  500 mg Oral Daily   Continuous Infusions: . sodium chloride Stopped (11/08/19 1818)  . ampicillin (OMNIPEN) IV    . methocarbamol (ROBAXIN) IV     PRN Meds:.acetaminophen, diphenhydrAMINE, diphenhydrAMINE, EPINEPHrine, HYDROmorphone (DILAUDID) injection, menthol-cetylpyridinium **OR** phenol, methocarbamol **OR** methocarbamol (ROBAXIN) IV, metoCLOPramide **OR** metoCLOPramide (REGLAN) injection, nitroGLYCERIN, ondansetron **OR** ondansetron (ZOFRAN) IV, oxyCODONE, oxyCODONE, sodium chloride flush, sorbitol   SUBJECTIVE: He is currently nonweightbearing on his left leg and therefore is not having much pain.  He tells me  that his left hip dressing had to be changed this morning because he was having more drainage.  He tolerated his challenge dose of amoxicillin yesterday without any difficulty or side effects.  Review of Systems: Review of Systems  Constitutional: Positive for fever. Negative for chills and diaphoresis.  Musculoskeletal: Positive for joint pain.    Allergies  Allergen Reactions  . Golimumab Anaphylaxis    Simponi ARIA  . Orencia [Abatacept] Anaphylaxis  . Other Anaphylaxis and Hives    Pecans  . Peanut-Containing Drug Products Anaphylaxis, Hives and Swelling    Swelling of throat  . Morphine Other (See Comments)    Severe headache- Can tolerate Dilaudid, however  . Oxycodone-Acetaminophen Other (See Comments)    Headache 6.22.2020 patient is currently taking and tolerating  . Promethazine Hcl Other (See Comments)    Makes him feel "drunk" at higher strengths    OBJECTIVE: Vitals:   11/08/19 1815 11/08/19 2032 11/09/19 0254 11/09/19 0815  BP: 117/69 114/65 113/67 107/66  Pulse: 96 87 83 86  Resp: '16 18 17 19  '$ Temp: 99.5 F (37.5 C) (!) 100.7 F (38.2 C) 98.6 F (37 C) 99.8 F (37.7 C)  TempSrc: Oral Oral Oral Oral  SpO2: 94% 92% 92% 95%  Weight:      Height:       Body mass index is 25.7 kg/m.  Physical Exam Constitutional:      Comments: He is in good spirits.  Musculoskeletal:     Comments: He has a clean dry dressing over his left hip incision.  Skin:    Comments: New right arm PICC in place.  Psychiatric:        Mood and Affect: Mood normal.     Lab Results Lab Results  Component Value Date   WBC 9.2 11/08/2019   HGB 7.7 (L) 11/08/2019   HCT 24.7 (L) 11/08/2019   MCV 116.0 (H) 11/08/2019   PLT 97 (L) 11/08/2019    Lab Results  Component Value Date   CREATININE 0.68 11/08/2019   BUN 10 11/08/2019   NA 136 11/08/2019   K 3.5 11/08/2019   CL 102 11/08/2019   CO2 27 11/08/2019    Lab Results  Component Value Date   ALT 12 08/16/2019   AST  16 08/16/2019   ALKPHOS 96 08/16/2019   BILITOT 0.7 08/16/2019     Microbiology: Recent Results (from the past 240 hour(s))  Surgical pcr screen     Status: None   Collection Time: 11/01/19 10:33 AM   Specimen: Nasal Mucosa; Nasal Swab  Result Value Ref Range Status   MRSA, PCR NEGATIVE NEGATIVE Final   Staphylococcus aureus NEGATIVE NEGATIVE Final    Comment: (NOTE) The Xpert SA Assay (FDA approved for NASAL specimens in patients 64 years of age and older), is one component of a comprehensive surveillance program. It is not intended to diagnose infection nor to  guide or monitor treatment. Performed at [facility], [place] [address] [place], [place] [address]   SARS CORONAVIRUS 2 (TAT 6-24 HRS) Nasopharyngeal Nasopharyngeal Swab     Status: None   Collection Time: 11/01/19 11:28 AM   Specimen: Nasopharyngeal Swab  Result Value Ref Range Status   SARS Coronavirus 2 NEGATIVE NEGATIVE Final    Comment: (NOTE) SARS-CoV-2 target nucleic acids are NOT DETECTED.  The SARS-CoV-2 RNA is generally detectable in upper and lower respiratory specimens during the acute phase of infection. Negative results do not preclude SARS-CoV-2 infection, do not rule out co-infections with other pathogens, and should not be used as the sole basis for treatment or other patient management decisions. Negative results must be combined with clinical observations, patient history, and epidemiological information. The expected result is Negative.  Fact Sheet for Patients: SugarRoll.be  Fact Sheet for Healthcare Providers: https://www.woods-mathews.com/  This test is not yet approved or cleared by the [place] FDA and  has been authorized for detection and/or diagnosis of SARS-CoV-2 by FDA under an Emergency Use Authorization (EUA). This EUA will remain  in effect (meaning this test can be used) for the duration of the COVID-19 declaration under Se  ction 564(b)(1) of the Act, 21 U.S.C. section 360bbb-3(b)(1), unless the authorization is terminated or revoked sooner.  Performed at [facility], [place] [address], [place], [place] [address]   Aerobic/Anaerobic Culture (surgical/deep wound)     Status: None   Collection Time: 11/03/19  8:23 AM   Specimen: PATH Other; Body Fluid  Result Value Ref Range Status   Specimen Description WOUND LEFT HIP REVISION ACETABULAR  Final   Special Requests A  Final   Gram Stain   Final    RARE WBC PRESENT, PREDOMINANTLY PMN NO ORGANISMS SEEN RESULT CALLED TO, READ BACK BY AND VERIFIED WITH: DR  Ninfa Linden [id number] FCP    Culture   Final    RARE ENTEROCOCCUS FAECALIS NO ANAEROBES ISOLATED Performed at [facility], [place]. [address] [place], [place] [address]    Report Status 11/08/2019 FINAL  Final   Organism ID, Bacteria ENTEROCOCCUS FAECALIS  Final      Susceptibility   Enterococcus faecalis - MIC*    AMPICILLIN <=2 SENSITIVE Sensitive     VANCOMYCIN <=0.5 SENSITIVE Sensitive     GENTAMICIN SYNERGY RESISTANT Resistant     * RARE ENTEROCOCCUS FAECALIS    Michel Bickers, [facility] [phone] pager   [phone] cell 11/09/2019, 12:35 PM

## 2019-11-09 NOTE — Progress Notes (Signed)
Inpatient Rehabilitation Admissions Coordinator  Noted Tmax 100.7 at 2032 6/29. I await medical readiness to admit to CIR. Patient is aware.  Danne Baxter, RN, MSN Rehab Admissions Coordinator [phone] 11/09/2019 11:31 AM

## 2019-11-09 NOTE — Progress Notes (Signed)
Patient ID: [name], male   DOB: 1951/02/27, 69 y.o.   MRN: [id number] Temp back down to 99.  Encouraged IS, cough, deep breathing and tylenol.  IV antibiotics have now been switched and he has his PICC line.  I am fine with discharge to CIR today. Ok if they feel he should wait one more day.

## 2019-11-09 NOTE — Progress Notes (Signed)
PHARMACY CONSULT NOTE FOR:  OUTPATIENT  PARENTERAL ANTIBIOTIC THERAPY (OPAT)  Indication: Prosthetic hip infection Regimen: Ampicillin 12 gm every 24 hours as a continuous infusion End date: 12/20/19  IV antibiotic discharge orders are pended. To discharging provider:  please sign these orders via discharge navigator,  Select New Orders & click on the button choice - Manage This Unsigned Work.     Thank you for allowing pharmacy to be a part of this patient's care.  Jimmy Footman, PharmD, BCPS, [facility] Clinical Pharmacist Phone: [phone] 11/09/2019, 10:25 AM

## 2019-11-09 NOTE — Telephone Encounter (Signed)
Patient called, he is requesting ONO results, sent message to [facility]. Waiting for response.

## 2019-11-09 NOTE — Plan of Care (Signed)
  Problem: Education: Goal: Knowledge of General Education information will improve Description: Including pain rating scale, medication(s)/side effects and non-pharmacologic comfort measures Outcome: Progressing   Problem: Pain Managment: Goal: General experience of comfort will improve Outcome: Progressing   

## 2019-11-09 NOTE — Progress Notes (Signed)
Patient ID: [name], male   DOB: April 20, 1951, 69 y.o.   MRN: [id number] Appreciate consults from Infectious Disease and Inpatient Rehab.  He is stable from Ortho standpoint and can transition to Inpatient Rehab when bed available.  His left hip incision is stable.  I do expect bloody drainage.  This has occurred after all of his surgeries.  Change dressing as needed.

## 2019-11-09 NOTE — H&P (Signed)
Physical Medicine and Rehabilitation Admission H&P     HPI: Sanjit Mcmichael. Giuliani is a 69 year old right-handed male with history of chronic back pain/fibromyalgia, CLL currently on no therapy, CAD with CABG 2010, hypertension, HIV infection diagnosed 1993, TIA 1997, lateral knee surgery, patient underwent left total hip arthroplasty April of last year postoperatively he had instability and recurrent dislocations requiring several other surgeries through June of last year and in late October he fell sustaining a periprosthetic fracture around the femoral stem.  A CT scan in December showed some lucency around some of the acetabular screws.  .  Per chart review patient lives alone.  Independent with assistive device.  1 level home 5 steps to entry.  Presented 11/03/2019 after mechanical fall without loss of consciousness.  X-rays and imaging revealed progressive loosening of left hip acetabular component.  The acetabular component had become more medialized and more vertical after fall.  There were noted fracture lines in the pelvis on CT scan.  Patient underwent revision left hip acetabular component 11/03/2019 per Dr. Ninfa Linden.  Touchdown weightbearing left lower extremity.  Maintain on aspirin 81 mg twice daily for DVT prophylaxis.  Acute blood loss anemia 7.7.  Wound culture showed Enterococcus with infectious disease consulted Dr. Michel Bickers and placed on IV vancomycin initially and transitioned to intravenous ampicillin that he will continue on for 6 weeks before converting to oral amoxicillin.  Therapy evaluations completed and patient was admitted for a comprehensive rehab program.  Review of Systems  Constitutional: Positive for malaise/fatigue. Negative for chills and fever.  HENT: Negative for hearing loss.   Eyes: Negative for blurred vision and double vision.  Respiratory: Negative for cough and shortness of breath.   Cardiovascular: Positive for leg swelling. Negative for chest pain and  palpitations.  Gastrointestinal: Positive for constipation. Negative for heartburn, nausea and vomiting.       GERD  Genitourinary: Positive for urgency. Negative for dysuria and hematuria.  Musculoskeletal: Positive for back pain, falls, joint pain and myalgias.  Skin: Negative for rash.  Psychiatric/Behavioral: Positive for depression. The patient has insomnia.        Anxiety  All other systems reviewed and are negative.  Past Medical History:  Diagnosis Date  . Allergy   . Anxiety   . Carotid artery occlusion    40-60% right ICA stenosis (09/2008)  . Cataract   . Chronic back pain   . CLL (chronic lymphoblastic leukemia) dx 2010   Followed at [facility] no current therapy   . Clotting disorder ([place]   . Coronary artery disease 2010   s/p CABG '10, sees Dr. HPercival Spanish . Depression   . Diverticulosis   . DVT, lower extremity, recurrent ([facility] 2008, 2009   LLE, chronic anticoag since 2009  . Esophagitis   . Fibromyalgia   . Gallstones   . GERD (gastroesophageal reflux disease)   . Gout   . Gynecomastia, male   . H/O hiatal hernia 2008   surgery  . Hemorrhoids   . Hepatitis A yrs ago  . HIV infection ([facility] dx 1993  . Hypertension   . Impotence of organic origin   . Myocardial infarction ([place] 2010    x 2  . Neuromuscular disorder (HCC)    neuropathy  . Osteoarthritis, knee    s/p B TKA  . Osteoporosis   . PONV (postoperative nausea and vomiting)   . Pulmonary fibrosis ([facility] 2017  . Rheumatoid arthritis(714.0) dx 2010   MTX, follows with  rheum  . Seasonal allergies   . Secondary syphilis 07/24/14 dx   s/p 2 wks doxy  . Status post dilation of esophageal narrowing   . Stroke [facility]) 1969   TIA  . TIA (transient ischemic attack) 1997   mild residual L mouth droop  . Tubular adenoma of colon    Past Surgical History:  Procedure Laterality Date  . ACETABULAR REVISION Left 11/03/2019   REVISION LEFT HIP ACETABULAR COMPONENT (Left Hip)  . ANTERIOR HIP REVISION Left  08/17/2018   Procedure: ANTERIOR LEFT HIP REVISION;  Surgeon: Mcarthur Rossetti, MD;  Location: [place];  Service: Orthopedics;  Laterality: Left;  . ANTERIOR HIP REVISION Left 11/02/2018   Procedure: LEFT HIP CONSTRAINED LINER REVISION;  Surgeon: Mcarthur Rossetti, MD;  Location: [place];  Service: Orthopedics;  Laterality: Left;  . ANTERIOR HIP REVISION Left 11/03/2019   Procedure: REVISION LEFT HIP ACETABULAR COMPONENT;  Surgeon: Mcarthur Rossetti, MD;  Location: [place];  Service: Orthopedics;  Laterality: Left;  . BACK SURGERY  2010 & 2017  . CHOLECYSTECTOMY    . COLONOSCOPY WITH PROPOFOL N/A 12/28/2012   Procedure: COLONOSCOPY WITH PROPOFOL;  Surgeon: Jerene Bears, MD;  [facility];  Service: Gastroenterology;  Laterality: N/A;  . CORONARY ARTERY BYPASS GRAFT  2010   triple bypass  . ESOPHAGOGASTRODUODENOSCOPY (EGD) WITH PROPOFOL N/A 12/28/2012   Procedure: ESOPHAGOGASTRODUODENOSCOPY (EGD) WITH PROPOFOL;  Surgeon: Jerene Bears, MD;  [facility];  Service: Gastroenterology;  Laterality: N/A;  . ESOPHAGOGASTRODUODENOSCOPY (EGD) WITH PROPOFOL N/A 03/15/2013   Procedure: ESOPHAGOGASTRODUODENOSCOPY (EGD) WITH PROPOFOL;  Surgeon: Jerene Bears, MD;  [facility];  Service: Gastroenterology;  Laterality: N/A;  . ESOPHAGOGASTRODUODENOSCOPY (EGD) WITH PROPOFOL N/A 02/07/2016   Procedure: ESOPHAGOGASTRODUODENOSCOPY (EGD) WITH PROPOFOL;  Surgeon: Jerene Bears, MD;  [facility];  Service: Gastroenterology;  Laterality: N/A;  . HARDWARE REMOVAL N/A 07/02/2012   Procedure: HARDWARE REMOVAL;  Surgeon: Elaina Hoops, MD;  Location: [place] [facility];  Service: Neurosurgery;  Laterality: N/A;  . HIATAL HERNIA REPAIR     wrap  . HIP CLOSED REDUCTION Left 08/15/2018   Procedure: CLOSED REDUCTION HIP;  Surgeon: Newt Minion, MD;  Location: [place];  Service: Orthopedics;  Laterality: Left;  . HIP CLOSED REDUCTION Left 08/28/2018   Procedure: CLOSED REDUCTION HIP FOR  RECURRENT DISLOCATION AND DRESSING CHANGE;  Surgeon: Jessy Oto, MD;  Location: [place];  Service: Orthopedics;  Laterality: Left;  . HIP CLOSED REDUCTION Left 09/15/2018   Procedure: CLOSED REDUCTION HIP DISLOCATION;  Surgeon: Mcarthur Rossetti, MD;  Location: [place];  Service: Orthopedics;  Laterality: Left;  . HIP CLOSED REDUCTION Left 10/31/2018   Procedure: CLOSED REDUCTION LEFT TOTAL HIP;  Surgeon: Leandrew Koyanagi, MD;  Location: [place];  Service: Orthopedics;  Laterality: Left;  . INGUINAL HERNIA REPAIR Bilateral   . JOINT REPLACEMENT Left 1999  . KNEE ARTHROPLASTY  07/22/2011   Procedure: COMPUTER ASSISTED TOTAL KNEE ARTHROPLASTY;  Surgeon: Meredith Pel, MD;  Location: [place];  Service: Orthopedics;  Laterality: Right;  Right total knee arthroplasty  . MANDIBLE SURGERY Bilateral    tmj  . REPLACEMENT TOTAL KNEE Bilateral   . ring around testicle hernia reapir  184 and 1986   x 2  . ROTATOR CUFF REPAIR Right   . SHOULDER SURGERY Left   . SPINE SURGERY  2010   "rod and screws", "failed", lopwer spine,   . stent to heart x 1  2010  .  TONSILLECTOMY    . TOTAL HIP ARTHROPLASTY Left 08/13/2018   Procedure: LEFT TOTAL HIP ARTHROPLASTY ANTERIOR APPROACH;  Surgeon: Mcarthur Rossetti, MD;  Location: [place];  Service: Orthopedics;  Laterality: Left;  . TOTAL HIP ARTHROPLASTY Left 09/17/2018   Procedure: ANTERIOR LEFT HIP REVISION;  Surgeon: Mcarthur Rossetti, MD;  Location: [place];  Service: Orthopedics;  Laterality: Left;  . UMBILICAL HERNIA REPAIR     x 1  . varicose vein     stripping  . VIDEO BRONCHOSCOPY Bilateral 10/16/2015   Procedure: VIDEO BRONCHOSCOPY WITHOUT FLUORO;  Surgeon: Brand Males, MD;  [facility];  Service: Cardiopulmonary;  Laterality: Bilateral;   Family History  Problem Relation Age of Onset  . Breast cancer Mother   . Hypertension Mother   . Hyperlipidemia Mother   . Diabetes Mother   . Prostate cancer Father   . Colon polyps Father    . Hyperlipidemia Father   . Crohn's disease Paternal Aunt   . Diabetes Maternal Grandmother   . Diabetes Brother        x 3  . Heart attack Brother   . Heart disease Brother        x 3  . Heart attack Brother   . Hyperlipidemia Brother        x 3  . Alcohol abuse Daughter   . Drug abuse Daughter   . Asthma Brother   . CVA Brother   . Colon cancer Neg Hx   . Esophageal cancer Neg Hx   . Rectal cancer Neg Hx   . Stomach cancer Neg Hx    Social History:  reports that he has never smoked. He has never used smokeless tobacco. He reports previous alcohol use. He reports previous drug use. Drug: Marijuana. Allergies:  Allergies  Allergen Reactions  . Golimumab Anaphylaxis    Simponi ARIA  . Orencia [Abatacept] Anaphylaxis  . Other Anaphylaxis and Hives    Pecans  . Peanut-Containing Drug Products Anaphylaxis, Hives and Swelling    Swelling of throat  . Morphine Other (See Comments)    Severe headache- Can tolerate Dilaudid, however  . Oxycodone-Acetaminophen Other (See Comments)    Headache 6.22.2020 patient is currently taking and tolerating  . Penicillins Rash and Other (See Comments)    FLUSHED & RED, also Has patient had a PCN reaction causing immediate rash, facial/tongue/throat swelling, SOB or lightheadedness with hypotension: Yes Has patient had a PCN reaction causing severe rash involving mucus membranes or skin necrosis: No Has patient had a PCN reaction that required hospitalization: No Has patient had a PCN reaction occurring within the last 10 years: No If all of the above answers are "NO", then may proceed with Cephalosporin use  . Promethazine Hcl Other (See Comments)    Makes him feel "drunk" at higher strengths   Medications Prior to Admission  Medication Sig Dispense Refill  . atorvastatin (LIPITOR) 10 MG tablet TAKE 1 TABLET BY MOUTH AT BEDTIME (Patient taking differently: Take 10 mg by mouth at bedtime. ) 30 tablet 9  .  bictegravir-emtricitabine-tenofovir AF (BIKTARVY) 50-200-25 MG TABS tablet Take 1 tablet by mouth daily.     . clindamycin (CLEOCIN) 300 MG capsule Take two by mouth one hour before dental appointment, then two by mouth six hours after appointment (Patient taking differently: Take 600 mg by mouth See admin instructions. Take two by mouth one hour before dental appointment, then two by mouth six hours after appointment) 12 capsule 0  .  darunavir-cobicistat (PREZCOBIX) 800-150 MG tablet Take 1 tablet by mouth daily with breakfast.     . EPINEPHrine (EPIPEN 2-PAK) 0.3 mg/0.3 mL IJ SOAJ injection Inject 0.3 mg into the muscle as needed for anaphylaxis.    [name] escitalopram (LEXAPRO) 20 MG tablet Take 1 tablet (20 mg total) by mouth daily. 90 tablet 1  . ezetimibe (ZETIA) 10 MG tablet Take 1 tablet (10 mg total) by mouth daily. (Patient taking differently: Take 10 mg by mouth at bedtime. ) 90 tablet 3  . folic acid (FOLVITE) 1 MG tablet Take 1 mg by mouth daily.  11  . HYDROcodone-acetaminophen (NORCO) 10-325 MG tablet Take 1 tablet by mouth 2 (two) times daily as needed (pain.).    [name] l-methylfolate-B6-B12 (METANX) 3-35-2 MG TABS tablet Take 1 tablet by mouth 2 (two) times daily. 60 tablet 5  . leflunomide (ARAVA) 20 MG tablet Take 20 mg by mouth daily.     . metoprolol succinate (TOPROL-XL) 50 MG 24 hr tablet Take 1 tablet (50 mg total) by mouth daily. 90 tablet 3  . nitroGLYCERIN (NITROSTAT) 0.4 MG SL tablet Place 1 tablet (0.4 mg total) under the tongue every 5 (five) minutes as needed for chest pain. 25 tablet 2  . ondansetron (ZOFRAN) 4 MG tablet Take 4 mg by mouth every 6 (six) hours as needed for nausea.    . predniSONE (DELTASONE) 5 MG tablet Take 5 mg by mouth 2 (two) times daily.   1  . pregabalin (LYRICA) 200 MG capsule TAKE 1 CAPSULE BY MOUTH TWICE A DAY (Patient taking differently: Take 200 mg by mouth 2 (two) times daily. ) 60 capsule 3  . SAVAYSA 30 MG TABS tablet TAKE 1 TABLET (30 MG TOTAL) BY  MOUTH DAILY. (Patient taking differently: Take 30 mg by mouth daily. ) 30 tablet 5  . sulfamethoxazole-trimethoprim (BACTRIM DS) 800-160 MG tablet TAKE 1 TABLET BY MOUTH EVERY 12 HOURS (Patient taking differently: Take 1 tablet by mouth in the morning and at bedtime. ) 60 tablet 3  . sulfaSALAzine (AZULFIDINE) 500 MG tablet Take 500 mg by mouth in the morning and at bedtime.     . tamsulosin (FLOMAX) 0.4 MG CAPS capsule TAKE 1 CAPSULE (0.4 MG TOTAL) BY MOUTH DAILY AFTER SUPPER. 30 capsule 1  . torsemide (DEMADEX) 20 MG tablet TAKE 1 TABLET BY MOUTH EVERY DAY (Patient taking differently: Take 20 mg by mouth daily. ) 90 tablet 1  . valACYclovir (VALTREX) 500 MG tablet TAKE 1 TABLET BY MOUTH EVERY DAY (Patient taking differently: Take 500 mg by mouth daily. ) 30 tablet 4  . acetaminophen (TYLENOL) 500 MG tablet Take 500-1,000 mg by mouth every 6 (six) hours as needed for mild pain or moderate pain.       Drug Regimen Review Drug regimen was reviewed and remains appropriate with no significant issues identified  Home: Home Living Family/patient expects to be discharged to:: Private residence Living Arrangements: Alone Available Help at Discharge: Family, Available PRN/intermittently Type of Home: House Home Access: Stairs to enter Technical brewer of Steps: 5 Entrance Stairs-Rails: Right, Left Home Layout: One level Bathroom Toilet: Standard Home Equipment: Environmental consultant - 2 wheels, Cane - single point Additional Comments: has been able to walk well until recent increase in L hip pain   Functional History: Prior Function Level of Independence: Independent with assistive device(s) Comments: mostly rollator used  Functional Status:  Mobility: Bed Mobility Overal bed mobility: Needs Assistance Bed Mobility: Supine to Sit Supine to sit: Min  assist General bed mobility comments: heavy UE use and increased time needed, assist for L LE Transfers Overall transfer level: Needs  assistance Equipment used: Bilateral platform walker, 4-wheeled walker (EVA walker to simulate UP walker he has at home) Transfers: Sit to/from Stand, Risk manager Sit to Stand: Min assist, From elevated surface Stand pivot transfers: Min assist  Lateral/Scoot Transfers: Min assist, From elevated surface General transfer comment: up from slightly elevated bed min A for balance during transition; stepping to recliner with EVA walker and min A for safety Ambulation/Gait Ambulation/Gait assistance: Min assist Gait Distance (Feet): 1 Feet Assistive device: 4-wheeled walker, Bilateral platform walker Gait Pattern/deviations: Step-to pattern, Decreased stride length, Trunk flexed General Gait Details: 2 steps fwd and back to simulate pivot transfer with [name] walker and cues for sequencing, safety, sit to stand performed x 2    ADL:    Cognition: Cognition Overall Cognitive Status: Within Functional Limits for tasks assessed Orientation Level: Oriented X4 Cognition Arousal/Alertness: Awake/alert Behavior During Therapy: WFL for tasks assessed/performed Overall Cognitive Status: Within Functional Limits for tasks assessed  Physical Exam: Blood pressure 113/67, pulse 83, temperature 98.6 F (37 C), temperature source Oral, resp. rate 17, height 5\' 9"  (1.753 m), weight 78.9 kg, SpO2 92 %. Physical Exam  General: Alert and oriented x 3, No apparent distress HEENT: Head is normocephalic, atraumatic, PERRLA, EOMI, sclera anicteric, oral mucosa pink and moist, dentition intact, ext ear canals clear,  Neck: Supple without JVD or lymphadenopathy Heart: Reg rate and rhythm. No murmurs rubs or gallops Chest: CTA bilaterally without wheezes, rales, or rhonchi; no distress Abdomen: Soft, non-tender, non-distended, bowel sounds positive. Extremities: No clubbing, cyanosis, or edema. Pulses are 2+ Skin:    Comments: Left hip incision is dressed with some bloody drainage.  No odor noted.   Appropriately tender  Neurological:     Comments: Patient is alert in no acute distress and follows commands.  Oriented x3 and cooperative with exam.   Results for orders placed or performed during the hospital encounter of 11/03/19 (from the past 48 hour(s))  CBC     Status: Abnormal   Collection Time: 11/08/19  4:33 AM  Result Value Ref Range   WBC 9.2 4.0 - 10.5 K/uL   RBC 2.13 (L) 4.22 - 5.81 MIL/uL   Hemoglobin 7.7 (L) 13.0 - 17.0 g/dL   HCT 24.7 (L) 39 - 52 %   MCV 116.0 (H) 80.0 - 100.0 fL   MCH 36.2 (H) 26.0 - 34.0 pg   MCHC 31.2 30.0 - 36.0 g/dL   RDW 14.3 11.5 - 15.5 %   Platelets 97 (L) 150 - 400 K/uL    Comment: SPECIMEN CHECKED FOR CLOTS CONSISTENT WITH PREVIOUS RESULT Immature Platelet Fraction may be clinically indicated, consider ordering this additional test [id number] REPEATED TO VERIFY    nRBC 0.0 0.0 - 0.2 %    Comment: Performed at [facility], [address] [place], [place] [address]  Basic metabolic panel     Status: Abnormal   Collection Time: 11/08/19  4:33 AM  Result Value Ref Range   Sodium 136 135 - 145 mmol/L   Potassium 3.5 3.5 - 5.1 mmol/L   Chloride 102 98 - 111 mmol/L   CO2 27 22 - 32 mmol/L   Glucose, Bld 116 (H) 70 - 99 mg/dL    Comment: Glucose reference range applies only to samples taken after fasting for at least 8 hours.   BUN 10 8 - 23  mg/dL   Creatinine, Ser 0.68 0.61 - 1.24 mg/dL   Calcium 7.7 (L) 8.9 - 10.3 mg/dL   GFR calc non Af Amer >60 >60 mL/min   GFR calc Af Amer >60 >60 mL/min   Anion gap 7 5 - 15    Comment: Performed at [facility] [address], [place], [place] [address]   *Note: Due to a large number of results and/or encounters for the requested time period, some results have not been displayed. A complete set of results can be found in Results Review.   DG CHEST PORT 1 VIEW  Result Date: 11/07/2019 CLINICAL DATA:  Chest pain at rest EXAM: PORTABLE CHEST 1 VIEW COMPARISON:  CT 10/18/2019, chest  x-ray 08/13/2019 FINDINGS: Post sternotomy changes. Mild basilar fibrosis. Small left effusion or pleural thickening. Enlarged cardiomediastinal silhouette with aortic atherosclerosis. No pneumothorax. IMPRESSION: Cardiomegaly. Mild basilar fibrosis. Small left pleural effusion or pleural thickening. Electronically Signed   By: Donavan Foil M.D.   On: 11/07/2019 20:18   [place] EKG SITE RITE  Result Date: 11/08/2019 If Site Rite image not attached, placement could not be confirmed due to current cardiac rhythm.   Medical Problem List and Plan: 1.  Decreased functional mobility secondary to failed left hip acetabular component with history of left total hip arthroplasty April of last year complicated by multiple dislocations and recent fall  -patient may shower but incision must be covered.   -ELOS/Goals: modI in 10-14 days 2.  Antithrombotics: -DVT/anticoagulation: SCDs.  Check vascular study  -antiplatelet therapy: Aspirin 81 mg twice daily 3. Pain Management: Lyrica 200 mg twice daily, oxycodone and Robaxin as needed. Pain is currently well controlled.  4. Mood: Lexapro 20 mg daily  -antipsychotic agents: N/A 5. Neuropsych: This patient is capable of making decisions on his own behalf. 6. Skin/Wound Care: Daily inspection of left hip. Incision is currently with bloody drainage, to be expected as per ortho.  7. Fluids/Electrolytes/Nutrition: Routine in and outs with follow-up chemistries 8.  Acute blood loss anemia.  Follow-up CBC. 6/29 Hgb 7.7 9.  HIV diagnosed 29.  Follow-up infectious disease.  Continue continue antiretroviral medications 10.  ID/enterococcal prosthetic infection left hip.  Continue intravenous ampicillin 2 g every 4 hours x6 weeks then converting to oral amoxicillin.  Follow-up per infectious disease Dr. Michel Bickers 11.  Hypertension.  Toprol 50 mg daily, Demadex 20 mg daily.  Monitor with increased mobility  7/1: Currently hypotensive. HR well controlled. Decrease  Torsemide to 10mg  daily.  12.  Hyperlipidemia.  Lipitor/Zetia 13.  BPH.  Flomax 0.4 mg daily.   Lavon Paganini Angiulli, PA-C 11/09/2019   I have personally performed a face to face diagnostic evaluation, including, but not limited to relevant history and physical exam findings, of this patient and developed relevant assessment and plan.  Additionally, I have reviewed and concur with the physician assistant's documentation above.  Leeroy Cha, MD

## 2019-11-09 NOTE — Plan of Care (Signed)

## 2019-11-09 NOTE — Discharge Summary (Signed)
Patient ID: [name] MRN: [id number] DOB/AGE: 06-07-1950 69 y.o.  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 Allergy   . Anxiety   . Carotid artery occlusion    40-60% right ICA stenosis (09/2008)  . Cataract   . Chronic back pain   . CLL (chronic lymphoblastic leukemia) dx 2010   Followed at [facility] q35mo no current therapy   . Clotting disorder ([place]   . Coronary artery disease 2010   s/p CABG '10, sees Dr. HPercival Spanish . Depression   . Diverticulosis   . DVT, lower extremity, recurrent ([place] 2008, 2009   LLE, chronic anticoag since 2009  . Esophagitis   . Fibromyalgia   . Gallstones   . GERD (gastroesophageal reflux disease)   . Gout   . Gynecomastia, male   . H/O hiatal hernia 2008   surgery  . Hemorrhoids   . Hepatitis A yrs ago  . HIV infection ([place] 1993  . Hypertension   . Impotence of organic origin   . [place]ocardial infarction ([place] 2010    x 2  . Neuromuscular disorder (HCC)    neuropathy  . Osteoarthritis, knee    s/p B TKA  . Osteoporosis   . PONV (postoperative nausea and vomiting)   . Pulmonary fibrosis ([place] 2017  . Rheumatoid arthritis(714.0) dx 2010   MTX, follows with rheum  . Seasonal allergies   . Secondary syphilis 07/24/14 dx   s/p 2 wks doxy  . Status post dilation of esophageal narrowing   . Stroke ([facility] 1969    TIA  . TIA (transient ischemic attack) 1997   mild residual L mouth droop  . Tubular adenoma of colon     Surgeries: Procedure(s): REVISION LEFT HIP ACETABULAR COMPONENT on 11/03/2019   Consultants:   Discharged Condition: Improved  Hospital Course: PLAKENDRICK PARADISis an 69y.o. male who was admitted 11/03/2019 for operative treatment ofProsthetic joint infection of left hip ([place]. Patient has severe unremitting pain that affects sleep, daily activities, and work/hobbies. After pre-op clearance the patient was taken to the operating room on 11/03/2019 and underwent  Procedure(s): REVISION LEFT HIP ACETABULAR COMPONENT.    Patient was given perioperative antibiotics:  Anti-infectives (From admission, onward)   Start     Dose/Rate Route Frequency Ordered Stop   11/09/19 1600  ampicillin (OMNIPEN) 2 g in sodium chloride 0.9 % 100 mL IVPB     Discontinue     2 g 300 mL/hr over 20 Minutes Intravenous Every 4 hours 11/09/19 1017     11/09/19 1230  fluconazole (DIFLUCAN) tablet 150 mg     Discontinue     150 mg Oral  Once 11/09/19 1138     11/09/19 0000  ampicillin IVPB     Discontinue     12 g Intravenous Every 24 hours 11/09/19 1146 12/20/19  2359   11/08/19 1700  amoxicillin (AMOXIL) capsule 500 mg        500 mg Oral  Once 11/08/19 1456 11/08/19 1716   11/08/19 0800  vancomycin (VANCOCIN) IVPB 1000 mg/200 mL premix  Status:  Discontinued        1,000 mg 200 mL/hr over 60 Minutes Intravenous Every 12 hours 11/07/19 1812 11/09/19 1017   11/07/19 1815  vancomycin (VANCOREADY) IVPB 1500 mg/300 mL        1,500 mg 150 mL/hr over 120 Minutes Intravenous  Once 11/07/19 1812 11/07/19 2105   11/04/19 1000  bictegravir-emtricitabine-tenofovir AF (BIKTARVY) 50-200-25 MG per tablet 1 tablet     Discontinue     1 tablet Oral Daily 11/03/19 1718     11/04/19 1000  valACYclovir (VALTREX) tablet 500 mg     Discontinue     500 mg Oral Daily 11/03/19 1718     11/04/19 0800  darunavir-cobicistat  (PREZCOBIX) 800-150 MG per tablet 1 tablet     Discontinue     1 tablet Oral Daily with breakfast 11/03/19 1718     11/03/19 2200  sulfamethoxazole-trimethoprim (BACTRIM DS) 800-160 MG per tablet 1 tablet     Discontinue     1 tablet Oral Every 12 hours 11/03/19 1718     11/03/19 1800  clindamycin (CLEOCIN) IVPB 600 mg        600 mg 100 mL/hr over 30 Minutes Intravenous Every 6 hours 11/03/19 1718 11/04/19 0512   11/03/19 0600  clindamycin (CLEOCIN) IVPB 900 mg        900 mg 100 mL/hr over 30 Minutes Intravenous On call to O.R. 11/03/19 0553 11/03/19 0800       Patient was given sequential compression devices, early ambulation, and chemoprophylaxis to prevent DVT.  Patient benefited maximally from hospital stay and there were no complications.  During his hospitalization, intraoperative cultures did grow out Enterococcus.  The infectious disease service was consulted and made a proper recommendations for antibiotic type and duration.  A PICC line was placed as well.  Therapy felt that the patient would best benefit from inpatient rehab to maximize his mobility and recovery.  Recent vital signs:  Patient Vitals for the past 24 hrs:  BP Temp Temp src Pulse Resp SpO2  11/09/19 0815 107/66 99.8 F (37.7 C) Oral 86 19 95 %  11/09/19 0254 113/67 98.6 F (37 C) Oral 83 17 92 %  11/08/19 2032 114/65 (!) 100.7 F (38.2 C) Oral 87 18 92 %  11/08/19 1815 117/69 99.5 F (37.5 C) Oral 96 16 94 %  11/08/19 1800 117/63 98.2 F (36.8 C) Axillary 95 16 97 %  11/08/19 1745 118/63 98 F (36.7 C) Axillary 94 15 95 %  11/08/19 1730 111/60 99.1 F (37.3 C) Axillary 100 16 97 %  11/08/19 1705 (!) 96/57 98.4 F (36.9 C) Axillary 99 16 96 %  11/08/19 1317 113/64 98.6 F (37 C) Oral 83 15 95 %     Recent laboratory studies:  Recent Labs    11/07/19 0417 11/08/19 0433  WBC 10.5 9.2  HGB 7.5* 7.7*  HCT 24.0* 24.7*  PLT 93* 97*  NA  --  136  K  --  3.5  CL  --  102  CO2  --  27  BUN  --  10   CREATININE  --  0.68  GLUCOSE  --  116*  CALCIUM  --  7.7*     Discharge Medications:   Allergies  as of 11/09/2019      Reactions   Golimumab Anaphylaxis   Simponi ARIA   Orencia [abatacept] Anaphylaxis   Other Anaphylaxis, Hives   Pecans   Peanut-containing Drug Products Anaphylaxis, Hives, Swelling   Swelling of throat   Morphine Other (See Comments)   Severe headache- Can tolerate Dilaudid, however   Oxycodone-acetaminophen Other (See Comments)   Headache 6.22.2020 patient is currently taking and tolerating   Promethazine Hcl Other (See Comments)   Makes him feel "drunk" at higher strengths      Medication List    TAKE these medications   acetaminophen 500 MG tablet Commonly known as: TYLENOL Take 500-1,000 mg by mouth every 6 (six) hours as needed for mild pain or moderate pain.   ampicillin  IVPB Inject 12 g into the vein daily. As a continuous infusion  Indication: hip infection First Dose: Yes Last Day of Therapy:  12/20/2019 Labs - Once weekly:  CBC/D and BMP, Labs - Every other week:  ESR and CRP Method of administration: Ambulatory Pump (Continuous Infusion) Method of administration may be changed at the discretion of home infusion pharmacist based upon assessment of the patient and/or caregiver's ability to self-administer the medication ordered.   atorvastatin 10 MG tablet Commonly known as: LIPITOR TAKE 1 TABLET BY MOUTH AT BEDTIME   Biktarvy 50-200-25 MG Tabs tablet Generic drug: bictegravir-emtricitabine-tenofovir AF Take 1 tablet by mouth daily.   clindamycin 300 MG capsule Commonly known as: Cleocin Take two by mouth one hour before dental appointment, then two by mouth six hours after appointment What changed:   how much to take  how to take this  when to take this   EpiPen 2-Pak 0.3 mg/0.3 mL Soaj injection Generic drug: EPINEPHrine Inject 0.3 mg into the muscle as needed for anaphylaxis.   escitalopram 20 MG tablet Commonly known  as: LEXAPRO Take 1 tablet (20 mg total) by mouth daily.   ezetimibe 10 MG tablet Commonly known as: ZETIA Take 1 tablet (10 mg total) by mouth daily. What changed: when to take this   folic acid 1 MG tablet Commonly known as: FOLVITE Take 1 mg by mouth daily.   HYDROcodone-acetaminophen 10-325 MG tablet Commonly known as: NORCO Take 1 tablet by mouth 2 (two) times daily as needed (pain.).   l-methylfolate-B6-B12 3-35-2 MG Tabs tablet Commonly known as: METANX Take 1 tablet by mouth 2 (two) times daily.   leflunomide 20 MG tablet Commonly known as: ARAVA Take 20 mg by mouth daily.   metoprolol succinate 50 MG 24 hr tablet Commonly known as: TOPROL-XL Take 1 tablet (50 mg total) by mouth daily.   nitroGLYCERIN 0.4 MG SL tablet Commonly known as: NITROSTAT Place 1 tablet (0.4 mg total) under the tongue every 5 (five) minutes as needed for chest pain.   ondansetron 4 MG tablet Commonly known as: ZOFRAN Take 4 mg by mouth every 6 (six) hours as needed for nausea.   predniSONE 5 MG tablet Commonly known as: DELTASONE Take 5 mg by mouth 2 (two) times daily.   pregabalin 200 MG capsule Commonly known as: LYRICA TAKE 1 CAPSULE BY MOUTH TWICE A DAY   Prezcobix 800-150 MG tablet Generic drug: darunavir-cobicistat Take 1 tablet by mouth daily with breakfast.   Savaysa 30 MG Tabs tablet Generic drug: edoxaban TAKE 1 TABLET (30 MG TOTAL) BY MOUTH DAILY. What changed: when to take this   sulfamethoxazole-trimethoprim 800-160 MG tablet Commonly known as: BACTRIM DS TAKE 1 TABLET BY MOUTH EVERY  12 HOURS What changed: when to take this   sulfaSALAzine 500 MG tablet Commonly known as: AZULFIDINE Take 500 mg by mouth in the morning and at bedtime.   tamsulosin 0.4 MG Caps capsule Commonly known as: FLOMAX TAKE 1 CAPSULE (0.4 MG TOTAL) BY MOUTH DAILY AFTER SUPPER.   torsemide 20 MG tablet Commonly known as: DEMADEX TAKE 1 TABLET BY MOUTH EVERY DAY   valACYclovir 500  MG tablet Commonly known as: VALTREX TAKE 1 TABLET BY MOUTH EVERY DAY            Discharge Care Instructions  (From admission, onward)         Start     Ordered   11/09/19 0000  Change dressing on IV access line weekly and PRN  (Home infusion instructions - [facility] )        11/09/19 1146          Diagnostic Studies: CT Chest High Resolution  Result Date: 10/19/2019 CLINICAL DATA:  Shortness of breath, pulmonary fibrosis. EXAM: CT CHEST WITHOUT CONTRAST TECHNIQUE: Multidetector CT imaging of the chest was performed following the standard protocol without intravenous contrast. High resolution imaging of the lungs, as well as inspiratory and expiratory imaging, was performed. COMPARISON:  11/17/2018 and 01/24/2016. FINDINGS: Cardiovascular: Atherosclerotic calcification of the aorta and aortic valve. Heart size normal. No pericardial effusion. Mediastinum/Nodes: Calcified mediastinal and hilar lymph nodes. No pathologically enlarged mediastinal or axillary lymph nodes. Hilar regions are otherwise difficult to definitively evaluate without IV contrast. Esophagus is grossly unremarkable. Lungs/Pleura: Basilar predominant subpleural reticulation, ground-glass and traction bronchiolectasis. Findings appear more organized/progressive from prior exams. Small left fibrothorax. Rounded atelectasis and scarring in the left lower lobe. No air trapping. Upper Abdomen: Visualized portion of the liver is unremarkable. Cholecystectomy. Visualized portions of the adrenal glands, right kidney, spleen, pancreas, stomach and bowel are unremarkable with the exception of a small hiatal hernia. Elongated peripherally calcified structure between the spleen and stomach is unchanged and has a benign appearance. Musculoskeletal: Postoperative and degenerative changes in the spine. Median sternotomy. Degenerative changes in the shoulders. No worrisome lytic or sclerotic lesions. IMPRESSION: 1. Basilar  predominant pattern of subpleural fibrosis appears more organized and progressive from prior exams and may be due to usual interstitial pneumonitis or fibrotic nonspecific interstitial pneumonitis. Findings are indeterminate for UIP per consensus guidelines: Diagnosis of Idiopathic Pulmonary Fibrosis: An Official ATS/ERS/JRS/ALAT Clinical Practice Guideline. [place], Iss 5, [phone], Jan 10 2017. 2. Small left fibrothorax with adjacent scarring and volume loss in the left lower lobe. 3.  Aortic atherosclerosis (ICD10-I70.0). Electronically Signed   By: Lorin Picket M.D.   On: 10/19/2019 12:34   DG Pelvis Portable  Result Date: 11/03/2019 CLINICAL DATA:  Post revision of total hip replacement EXAM: PORTABLE PELVIS 1-2 VIEWS COMPARISON:  Portable exam 1131 hours compared to intraoperative images of 11/03/2019 FINDINGS: LEFT hip prosthesis identified. Bones demineralized. No acute fracture or dislocation. High position of the LEFT acetabulum. Degenerative changes of the RIGHT hip joint noted. IMPRESSION: Post LEFT hip arthroplasty. No acute abnormalities. Electronically Signed   By: Lavonia Dana M.D.   On: 11/03/2019 13:22   DG CHEST PORT 1 VIEW  Result Date: 11/07/2019 CLINICAL DATA:  Chest pain at rest EXAM: PORTABLE CHEST 1 VIEW COMPARISON:  CT 10/18/2019, chest x-ray 08/13/2019 FINDINGS: Post sternotomy changes. Mild basilar fibrosis. Small left effusion or pleural thickening. Enlarged cardiomediastinal silhouette with aortic atherosclerosis. No pneumothorax. IMPRESSION: Cardiomegaly. Mild basilar  fibrosis. Small left pleural effusion or pleural thickening. Electronically Signed   By: Donavan Foil M.D.   On: 11/07/2019 20:18   DG C-Arm 1-60 Min  Result Date: 11/03/2019 CLINICAL DATA:  Left hip surgery. EXAM: DG C-ARM 1-60 MIN CONTRAST:  No prior. FLUOROSCOPY TIME:  Fluoroscopy Time: Radiation Exposure Index (if provided by the fluoroscopic device): 4 point 1 mGy COMPARISON:   10/06/2019. CT left hip 08/12/2019. FINDINGS: Total left hip replacement. Hardware intact. Anatomic alignment. True 0 acetabuli noted. No acute abnormality identified. IMPRESSION: Total left hip replacement. Electronically Signed   By: Marcello Moores  Register   On: 11/03/2019 10:46   DG HIP OPERATIVE UNILAT W OR W/O PELVIS LEFT  Result Date: 11/03/2019 CLINICAL DATA:  Left hip surgery. EXAM: OPERATIVE LEFT HIP (WITH PELVIS IF PERFORMED)  VIEWS TECHNIQUE: Fluoroscopic spot image(s) were submitted for interpretation post-operatively. COMPARISON:  Left hip series 10/06/2019 CT 08/12/2019. FINDINGS: Total left hip replacement. Hardware intact. Stable alignment. Stable protrusio acetabuli. No acute bony abnormality. IMPRESSION: Total left hip replacement with stable alignment. Electronically Signed   By: Marcello Moores  Register   On: 11/03/2019 10:40   [place] EKG SITE RITE  Result Date: 11/08/2019 If Site Rite image not attached, placement could not be confirmed due to current cardiac rhythm.   Disposition: Discharge disposition: [facility]-Rehab Facility       Discharge Instructions    [facility] pharmacist to adjust dose for Vancomycin, Aminoglycosides and other anti-infective therapies as requested by physician.   Complete by: As directed    [facility] to provide Cath Flo 15m   Complete by: As directed    Administer for PICC line occlusion and as ordered by physician for other access device issues.   Anaphylaxis Kit: Provided to treat any anaphylactic reaction to the medication being provided to the patient if First Dose or when requested by physician   Complete by: As directed    Epinephrine 141mml vial / amp: Administer 0.43m59m0.43ml59mubcutaneously once for moderate to severe anaphylaxis, nurse to call physician and pharmacy when reaction occurs and call 911 if needed for immediate care   Diphenhydramine 50mg70mIV vial: Administer 25-50mg 843mM PRN for first dose reaction, rash, itching,  mild reaction, nurse to call physician and pharmacy when reaction occurs   Sodium Chloride 0.9% NS 500ml I12mdminister if needed for hypovolemic blood pressure drop or as ordered by physician after call to physician with anaphylactic reaction   Change dressing on IV access line weekly and PRN   Complete by: As directed    Flush IV access with Sodium Chloride 0.9% and Heparin 10 units/ml or 100 units/ml   Complete by: As directed    Home infusion instructions - [facility]   Complete by: As directed    Instructions: Flush IV access with Sodium Chloride 0.9% and Heparin 10units/ml or 100units/ml   Change dressing on IV access line: Weekly and PRN   Instructions Cath Flo 2mg: Ad71mister for PICC Line occlusion and as ordered by physician for other access device   [facility] pharmacist to adjust dose for: Vancomycin, Aminoglycosides and other anti-infective therapies as requested by physician   Method of administration may be changed at the discretion of home infusion pharmacist based upon assessment of the patient and/or caregiver's ability to self-administer the medication ordered   Complete by: As directed        Follow-up Information    BlackmanMcarthur Rossettihedule an appointment as soon as possible  for a visit in 2 week(s).   Specialty: Orthopedic Surgery Contact information: [address] [place] [address] [phone]                Signed: Mcarthur Rossetti 11/09/2019, 11:47 AM

## 2019-11-10 ENCOUNTER — Inpatient Hospital Stay (HOSPITAL_COMMUNITY)
Admission: RE | Admit: 2019-11-10 | Discharge: 2019-11-16 | DRG: 560 | Disposition: A | Discharge status: Home Health | Payer: Medicare Other | Source: Intra-hospital | Attending: Physical Medicine & Rehabilitation | Admitting: Physical Medicine & Rehabilitation

## 2019-11-10 ENCOUNTER — Other Ambulatory Visit: Payer: Self-pay

## 2019-11-10 ENCOUNTER — Encounter (HOSPITAL_COMMUNITY): Payer: Self-pay | Admitting: Physical Medicine & Rehabilitation

## 2019-11-10 DIAGNOSIS — M109 Gout, unspecified: Secondary | ICD-10-CM | POA: Diagnosis present

## 2019-11-10 DIAGNOSIS — M810 Age-related osteoporosis without current pathological fracture: Secondary | ICD-10-CM | POA: Diagnosis present

## 2019-11-10 DIAGNOSIS — Z8249 Family history of ischemic heart disease and other diseases of the circulatory system: Secondary | ICD-10-CM

## 2019-11-10 DIAGNOSIS — I252 Old myocardial infarction: Secondary | ICD-10-CM

## 2019-11-10 DIAGNOSIS — I10 Essential (primary) hypertension: Secondary | ICD-10-CM | POA: Diagnosis present

## 2019-11-10 DIAGNOSIS — Z888 Allergy status to other drugs, medicaments and biological substances status: Secondary | ICD-10-CM

## 2019-11-10 DIAGNOSIS — Z8042 Family history of malignant neoplasm of prostate: Secondary | ICD-10-CM

## 2019-11-10 DIAGNOSIS — Z471 Aftercare following joint replacement surgery: Secondary | ICD-10-CM | POA: Diagnosis not present

## 2019-11-10 DIAGNOSIS — K219 Gastro-esophageal reflux disease without esophagitis: Secondary | ICD-10-CM | POA: Diagnosis present

## 2019-11-10 DIAGNOSIS — D62 Acute posthemorrhagic anemia: Secondary | ICD-10-CM | POA: Diagnosis present

## 2019-11-10 DIAGNOSIS — Z951 Presence of aortocoronary bypass graft: Secondary | ICD-10-CM

## 2019-11-10 DIAGNOSIS — N400 Enlarged prostate without lower urinary tract symptoms: Secondary | ICD-10-CM | POA: Diagnosis present

## 2019-11-10 DIAGNOSIS — J8410 Pulmonary fibrosis, unspecified: Secondary | ICD-10-CM | POA: Diagnosis present

## 2019-11-10 DIAGNOSIS — Z886 Allergy status to analgesic agent status: Secondary | ICD-10-CM

## 2019-11-10 DIAGNOSIS — M797 Fibromyalgia: Secondary | ICD-10-CM | POA: Diagnosis present

## 2019-11-10 DIAGNOSIS — G8929 Other chronic pain: Secondary | ICD-10-CM | POA: Diagnosis present

## 2019-11-10 DIAGNOSIS — R0989 Other specified symptoms and signs involving the circulatory and respiratory systems: Secondary | ICD-10-CM

## 2019-11-10 DIAGNOSIS — Z91010 Allergy to peanuts: Secondary | ICD-10-CM

## 2019-11-10 DIAGNOSIS — I2510 Atherosclerotic heart disease of native coronary artery without angina pectoris: Secondary | ICD-10-CM | POA: Diagnosis present

## 2019-11-10 DIAGNOSIS — M009 Pyogenic arthritis, unspecified: Secondary | ICD-10-CM

## 2019-11-10 DIAGNOSIS — S32402A Unspecified fracture of left acetabulum, initial encounter for closed fracture: Secondary | ICD-10-CM | POA: Diagnosis present

## 2019-11-10 DIAGNOSIS — G4730 Sleep apnea, unspecified: Secondary | ICD-10-CM | POA: Diagnosis present

## 2019-11-10 DIAGNOSIS — Z803 Family history of malignant neoplasm of breast: Secondary | ICD-10-CM | POA: Diagnosis not present

## 2019-11-10 DIAGNOSIS — I69392 Facial weakness following cerebral infarction: Secondary | ICD-10-CM

## 2019-11-10 DIAGNOSIS — S32402S Unspecified fracture of left acetabulum, sequela: Secondary | ICD-10-CM | POA: Diagnosis not present

## 2019-11-10 DIAGNOSIS — I959 Hypotension, unspecified: Secondary | ICD-10-CM | POA: Diagnosis present

## 2019-11-10 DIAGNOSIS — Z833 Family history of diabetes mellitus: Secondary | ICD-10-CM

## 2019-11-10 DIAGNOSIS — M549 Dorsalgia, unspecified: Secondary | ICD-10-CM | POA: Diagnosis present

## 2019-11-10 DIAGNOSIS — I719 Aortic aneurysm of unspecified site, without rupture: Secondary | ICD-10-CM | POA: Diagnosis not present

## 2019-11-10 DIAGNOSIS — Z7952 Long term (current) use of systemic steroids: Secondary | ICD-10-CM

## 2019-11-10 DIAGNOSIS — Z856 Personal history of leukemia: Secondary | ICD-10-CM | POA: Diagnosis not present

## 2019-11-10 DIAGNOSIS — T8452XA Infection and inflammatory reaction due to internal left hip prosthesis, initial encounter: Secondary | ICD-10-CM | POA: Diagnosis present

## 2019-11-10 DIAGNOSIS — Z86718 Personal history of other venous thrombosis and embolism: Secondary | ICD-10-CM | POA: Diagnosis not present

## 2019-11-10 DIAGNOSIS — M7989 Other specified soft tissue disorders: Secondary | ICD-10-CM | POA: Diagnosis not present

## 2019-11-10 DIAGNOSIS — M069 Rheumatoid arthritis, unspecified: Secondary | ICD-10-CM | POA: Diagnosis present

## 2019-11-10 DIAGNOSIS — Z91018 Allergy to other foods: Secondary | ICD-10-CM

## 2019-11-10 DIAGNOSIS — Z79899 Other long term (current) drug therapy: Secondary | ICD-10-CM

## 2019-11-10 DIAGNOSIS — E785 Hyperlipidemia, unspecified: Secondary | ICD-10-CM | POA: Diagnosis present

## 2019-11-10 DIAGNOSIS — Z96651 Presence of right artificial knee joint: Secondary | ICD-10-CM | POA: Diagnosis present

## 2019-11-10 DIAGNOSIS — Z825 Family history of asthma and other chronic lower respiratory diseases: Secondary | ICD-10-CM

## 2019-11-10 DIAGNOSIS — Z83438 Family history of other disorder of lipoprotein metabolism and other lipidemia: Secondary | ICD-10-CM

## 2019-11-10 DIAGNOSIS — Z21 Asymptomatic human immunodeficiency virus [HIV] infection status: Secondary | ICD-10-CM | POA: Diagnosis present

## 2019-11-10 DIAGNOSIS — Z8371 Family history of colonic polyps: Secondary | ICD-10-CM

## 2019-11-10 DIAGNOSIS — Z823 Family history of stroke: Secondary | ICD-10-CM

## 2019-11-10 DIAGNOSIS — Z885 Allergy status to narcotic agent status: Secondary | ICD-10-CM

## 2019-11-10 MED ORDER — VALACYCLOVIR HCL 500 MG PO TABS
500.0000 mg | ORAL_TABLET | Freq: Every day | ORAL | Status: DC
  Administered 2019-11-11 – 2019-11-16 (×6): 500 mg via ORAL
  Filled 2019-11-10 (×7): qty 1

## 2019-11-10 MED ORDER — NITROGLYCERIN 0.4 MG SL SUBL: 0.4000 mg | SUBLINGUAL_TABLET | SUBLINGUAL | Status: DC | PRN

## 2019-11-10 MED ORDER — ONDANSETRON HCL 4 MG PO TABS: 4.0000 mg | ORAL_TABLET | Freq: Four times a day (QID) | ORAL | Status: DC | PRN

## 2019-11-10 MED ORDER — SORBITOL 70 % SOLN: 30.0000 mL | Freq: Every day | Status: DC | PRN

## 2019-11-10 MED ORDER — ACETAMINOPHEN 325 MG PO TABS
325.0000 mg | ORAL_TABLET | Freq: Four times a day (QID) | ORAL | Status: DC | PRN
  Administered 2019-11-11 – 2019-11-14 (×3): 650 mg via ORAL
  Filled 2019-11-10 (×3): qty 2

## 2019-11-10 MED ORDER — METHOCARBAMOL 1000 MG/10ML IJ SOLN
500.0000 mg | Freq: Four times a day (QID) | INTRAVENOUS | Status: DC | PRN
  Filled 2019-11-10: qty 5

## 2019-11-10 MED ORDER — METOPROLOL SUCCINATE ER 50 MG PO TB24
50.0000 mg | ORAL_TABLET | Freq: Every day | ORAL | Status: DC
  Administered 2019-11-11 – 2019-11-16 (×6): 50 mg via ORAL
  Filled 2019-11-10 (×6): qty 1

## 2019-11-10 MED ORDER — TORSEMIDE 20 MG PO TABS
10.0000 mg | ORAL_TABLET | Freq: Every day | ORAL | Status: DC
  Administered 2019-11-11 – 2019-11-16 (×6): 10 mg via ORAL
  Filled 2019-11-10 (×6): qty 1

## 2019-11-10 MED ORDER — DARUNAVIR-COBICISTAT 800-150 MG PO TABS
1.0000 | ORAL_TABLET | Freq: Every day | ORAL | Status: DC
  Administered 2019-11-11 – 2019-11-16 (×6): 1 via ORAL
  Filled 2019-11-10 (×7): qty 1

## 2019-11-10 MED ORDER — OXYCODONE HCL 5 MG PO TABS
5.0000 mg | ORAL_TABLET | ORAL | Status: DC | PRN
  Administered 2019-11-11 – 2019-11-12 (×2): 10 mg via ORAL
  Filled 2019-11-10 (×2): qty 2

## 2019-11-10 MED ORDER — DOCUSATE SODIUM 100 MG PO CAPS
100.0000 mg | ORAL_CAPSULE | Freq: Two times a day (BID) | ORAL | Status: DC
  Administered 2019-11-10 – 2019-11-16 (×12): 100 mg via ORAL
  Filled 2019-11-10 (×12): qty 1

## 2019-11-10 MED ORDER — ESCITALOPRAM OXALATE 10 MG PO TABS
20.0000 mg | ORAL_TABLET | Freq: Every day | ORAL | Status: DC
  Administered 2019-11-11 – 2019-11-16 (×6): 20 mg via ORAL
  Filled 2019-11-10 (×6): qty 2

## 2019-11-10 MED ORDER — DIPHENHYDRAMINE HCL 12.5 MG/5ML PO ELIX: 12.5000 mg | ORAL_SOLUTION | ORAL | Status: DC | PRN

## 2019-11-10 MED ORDER — EDOXABAN TOSYLATE 30 MG PO TABS
30.0000 mg | ORAL_TABLET | Freq: Every day | ORAL | Status: DC
  Administered 2019-11-11 – 2019-11-16 (×6): 30 mg via ORAL
  Filled 2019-11-10 (×7): qty 1

## 2019-11-10 MED ORDER — METHOCARBAMOL 500 MG PO TABS: 500.0000 mg | ORAL_TABLET | Freq: Four times a day (QID) | ORAL | Status: DC | PRN

## 2019-11-10 MED ORDER — FOLIC ACID 1 MG PO TABS
1.0000 mg | ORAL_TABLET | Freq: Every day | ORAL | Status: DC
  Administered 2019-11-11 – 2019-11-16 (×6): 1 mg via ORAL
  Filled 2019-11-10 (×6): qty 1

## 2019-11-10 MED ORDER — TAMSULOSIN HCL 0.4 MG PO CAPS
0.4000 mg | ORAL_CAPSULE | Freq: Every day | ORAL | Status: DC
  Administered 2019-11-10 – 2019-11-15 (×6): 0.4 mg via ORAL
  Filled 2019-11-10 (×6): qty 1

## 2019-11-10 MED ORDER — SULFASALAZINE 500 MG PO TABS
500.0000 mg | ORAL_TABLET | Freq: Two times a day (BID) | ORAL | Status: DC
  Administered 2019-11-10 – 2019-11-16 (×12): 500 mg via ORAL
  Filled 2019-11-10 (×14): qty 1

## 2019-11-10 MED ORDER — BICTEGRAVIR-EMTRICITAB-TENOFOV 50-200-25 MG PO TABS
1.0000 | ORAL_TABLET | Freq: Every day | ORAL | Status: DC
  Administered 2019-11-11 – 2019-11-16 (×6): 1 via ORAL
  Filled 2019-11-10 (×7): qty 1

## 2019-11-10 MED ORDER — ONDANSETRON HCL 4 MG/2ML IJ SOLN: 4.0000 mg | Freq: Four times a day (QID) | INTRAMUSCULAR | Status: DC | PRN

## 2019-11-10 MED ORDER — ATORVASTATIN CALCIUM 10 MG PO TABS
10.0000 mg | ORAL_TABLET | Freq: Every day | ORAL | Status: DC
  Administered 2019-11-10 – 2019-11-15 (×6): 10 mg via ORAL
  Filled 2019-11-10 (×6): qty 1

## 2019-11-10 MED ORDER — SULFAMETHOXAZOLE-TRIMETHOPRIM 800-160 MG PO TABS
1.0000 | ORAL_TABLET | Freq: Two times a day (BID) | ORAL | Status: DC
  Administered 2019-11-10 – 2019-11-15 (×10): 1 via ORAL
  Filled 2019-11-10 (×10): qty 1

## 2019-11-10 MED ORDER — SODIUM CHLORIDE 0.9 % IV SOLN
2.0000 g | INTRAVENOUS | Status: DC
  Administered 2019-11-10 – 2019-11-16 (×35): 2 g via INTRAVENOUS
  Filled 2019-11-10 (×2): qty 2000
  Filled 2019-11-10 (×2): qty 2
  Filled 2019-11-10 (×2): qty 2000
  Filled 2019-11-10: qty 2
  Filled 2019-11-10 (×2): qty 2000
  Filled 2019-11-10: qty 2
  Filled 2019-11-10 (×2): qty 2000
  Filled 2019-11-10 (×4): qty 2
  Filled 2019-11-10: qty 2000
  Filled 2019-11-10 (×7): qty 2
  Filled 2019-11-10 (×3): qty 2000
  Filled 2019-11-10: qty 2
  Filled 2019-11-10: qty 2000
  Filled 2019-11-10: qty 2
  Filled 2019-11-10: qty 2000
  Filled 2019-11-10: qty 2
  Filled 2019-11-10 (×4): qty 2000
  Filled 2019-11-10: qty 2
  Filled 2019-11-10: qty 2000
  Filled 2019-11-10 (×2): qty 2
  Filled 2019-11-10: qty 2000
  Filled 2019-11-10: qty 2

## 2019-11-10 MED ORDER — ASPIRIN 81 MG PO CHEW: 81.0000 mg | CHEWABLE_TABLET | Freq: Two times a day (BID) | ORAL | Status: DC

## 2019-11-10 MED ORDER — EZETIMIBE 10 MG PO TABS
10.0000 mg | ORAL_TABLET | Freq: Every day | ORAL | Status: DC
  Administered 2019-11-10 – 2019-11-15 (×6): 10 mg via ORAL
  Filled 2019-11-10 (×6): qty 1

## 2019-11-10 MED ORDER — PANTOPRAZOLE SODIUM 40 MG PO TBEC
40.0000 mg | DELAYED_RELEASE_TABLET | Freq: Every day | ORAL | Status: DC
  Administered 2019-11-11 – 2019-11-16 (×6): 40 mg via ORAL
  Filled 2019-11-10 (×6): qty 1

## 2019-11-10 MED ORDER — LEFLUNOMIDE 20 MG PO TABS
20.0000 mg | ORAL_TABLET | Freq: Every day | ORAL | Status: DC
  Administered 2019-11-11 – 2019-11-16 (×6): 20 mg via ORAL
  Filled 2019-11-10 (×7): qty 1

## 2019-11-10 MED ORDER — PREGABALIN 75 MG PO CAPS
200.0000 mg | ORAL_CAPSULE | Freq: Two times a day (BID) | ORAL | Status: DC
  Administered 2019-11-10 – 2019-11-16 (×12): 200 mg via ORAL
  Filled 2019-11-10 (×12): qty 2

## 2019-11-10 MED ORDER — FA-PYRIDOXINE-CYANOCOBALAMIN 2.5-25-2 MG PO TABS
1.0000 | ORAL_TABLET | Freq: Two times a day (BID) | ORAL | Status: DC
  Administered 2019-11-10 – 2019-11-16 (×12): 1 via ORAL
  Filled 2019-11-10 (×14): qty 1

## 2019-11-10 MED ORDER — TORSEMIDE 20 MG PO TABS: 20.0000 mg | ORAL_TABLET | Freq: Every day | ORAL | Status: DC

## 2019-11-10 MED ORDER — PREDNISONE 5 MG PO TABS
5.0000 mg | ORAL_TABLET | Freq: Two times a day (BID) | ORAL | Status: DC
  Administered 2019-11-10 – 2019-11-16 (×12): 5 mg via ORAL
  Filled 2019-11-10 (×12): qty 1

## 2019-11-10 NOTE — Progress Notes (Signed)
Patient arrived on unit, oriented to unit. Reviewed medications, therapy schedule, rehab routine and plan of care. States an understanding of information reviewed. No complications noted at this time. [name]  

## 2019-11-10 NOTE — Care Management Important Message (Signed)
Important Message  Patient Details  Name: [name] MRN: [id number] Date of Birth: [birth year]   Medicare Important Message Given:  Yes     Shamiyah Ngu 11/10/2019, 1:12 PM

## 2019-11-10 NOTE — H&P (Signed)
Physical Medicine and Rehabilitation Admission H&P     HPI: [name]. [name] is a 69 year old right-handed male with history of chronic back pain/fibromyalgia, CLL currently on no therapy, CAD with CABG 2010, hypertension, HIV infection diagnosed 1993, TIA 1997, lateral knee surgery, patient underwent left total hip arthroplasty April of last year postoperatively he had instability and recurrent dislocations requiring several other surgeries through June of last year and in late October he fell sustaining a periprosthetic fracture around the femoral stem.  A CT scan in December showed some lucency around some of the acetabular screws.  .  Per chart review patient lives alone.  Independent with assistive device.  1 level home 5 steps to entry.  Presented 11/03/2019 after mechanical fall without loss of consciousness.  X-rays and imaging revealed progressive loosening of left hip acetabular component.  The acetabular component had become more medialized and more vertical after fall.  There were noted fracture lines in the pelvis on CT scan.  Patient underwent revision left hip acetabular component 11/03/2019 per Dr. Ninfa Linden.  Touchdown weightbearing left lower extremity.  Maintain on aspirin 81 mg twice daily for DVT prophylaxis.  Acute blood loss anemia 7.7.  Wound culture showed Enterococcus with infectious disease consulted Dr. Michel Bickers and placed on IV vancomycin initially and transitioned to intravenous ampicillin that he will continue on for 6 weeks before converting to oral amoxicillin.  Therapy evaluations completed and patient was admitted for a comprehensive rehab program.  Review of Systems  Constitutional: Positive for malaise/fatigue. Negative for chills and fever.  HENT: Negative for hearing loss.   Eyes: Negative for blurred vision and double vision.  Respiratory: Negative for cough and shortness of breath.   Cardiovascular: Positive for leg swelling. Negative for chest pain and  palpitations.  Gastrointestinal: Positive for constipation. Negative for heartburn, nausea and vomiting.       GERD  Genitourinary: Positive for urgency. Negative for dysuria and hematuria.  Musculoskeletal: Positive for back pain, falls, joint pain and myalgias.  Skin: Negative for rash.  Psychiatric/Behavioral: Positive for depression. The patient has insomnia.        Anxiety  All other systems reviewed and are negative.  Past Medical History:  Diagnosis Date  . Allergy   . Anxiety   . Carotid artery occlusion    40-60% right ICA stenosis (09/2008)  . Cataract   . Chronic back pain   . CLL (chronic lymphoblastic leukemia) dx 2010   Followed at [facility] no current therapy   . [place]otting disorder ([place]   . Coronary artery disease 2010   s/p CABG '10, sees Dr. HPercival Spanish . Depression   . Diverticulosis   . DVT, lower extremity, recurrent ([facility] 2008, 2009   LLE, chronic anticoag since 2009  . Esophagitis   . Fibromyalgia   . Gallstones   . GERD (gastroesophageal reflux disease)   . Gout   . Gynecomastia, male   . H/O hiatal hernia 2008   surgery  . Hemorrhoids   . Hepatitis A yrs ago  . HIV infection ([facility] dx 1993  . Hypertension   . Impotence of organic origin   . Myocardial infarction ([place] 2010    x 2  . Neuromuscular disorder (HCC)    neuropathy  . Osteoarthritis, knee    s/p B TKA  . Osteoporosis   . PONV (postoperative nausea and vomiting)   . Pulmonary fibrosis ([facility] 2017  . Rheumatoid arthritis(714.0) dx 2010   MTX, follows with  rheum  . Seasonal allergies   . Secondary syphilis 07/24/14 dx   s/p 2 wks doxy  . Status post dilation of esophageal narrowing   . Stroke [facility]) 1969   TIA  . TIA (transient ischemic attack) 1997   mild residual L mouth droop  . Tubular adenoma of colon    Past Surgical History:  Procedure Laterality Date  . ACETABULAR REVISION Left 11/03/2019   REVISION LEFT HIP ACETABULAR COMPONENT (Left Hip)  . ANTERIOR HIP REVISION Left  08/17/2018   Procedure: ANTERIOR LEFT HIP REVISION;  Surgeon: Mcarthur Rossetti, MD;  Location: [place];  Service: Orthopedics;  Laterality: Left;  . ANTERIOR HIP REVISION Left 11/02/2018   Procedure: LEFT HIP CONSTRAINED LINER REVISION;  Surgeon: Mcarthur Rossetti, MD;  Location: [place];  Service: Orthopedics;  Laterality: Left;  . ANTERIOR HIP REVISION Left 11/03/2019   Procedure: REVISION LEFT HIP ACETABULAR COMPONENT;  Surgeon: Mcarthur Rossetti, MD;  Location: [place];  Service: Orthopedics;  Laterality: Left;  . BACK SURGERY  2010 & 2017  . CHOLECYSTECTOMY    . COLONOSCOPY WITH PROPOFOL N/A 12/28/2012   Procedure: COLONOSCOPY WITH PROPOFOL;  Surgeon: Jerene Bears, MD;  [facility];  Service: Gastroenterology;  Laterality: N/A;  . CORONARY ARTERY BYPASS GRAFT  2010   triple bypass  . ESOPHAGOGASTRODUODENOSCOPY (EGD) WITH PROPOFOL N/A 12/28/2012   Procedure: ESOPHAGOGASTRODUODENOSCOPY (EGD) WITH PROPOFOL;  Surgeon: Jerene Bears, MD;  [facility];  Service: Gastroenterology;  Laterality: N/A;  . ESOPHAGOGASTRODUODENOSCOPY (EGD) WITH PROPOFOL N/A 03/15/2013   Procedure: ESOPHAGOGASTRODUODENOSCOPY (EGD) WITH PROPOFOL;  Surgeon: Jerene Bears, MD;  [facility];  Service: Gastroenterology;  Laterality: N/A;  . ESOPHAGOGASTRODUODENOSCOPY (EGD) WITH PROPOFOL N/A 02/07/2016   Procedure: ESOPHAGOGASTRODUODENOSCOPY (EGD) WITH PROPOFOL;  Surgeon: Jerene Bears, MD;  [facility];  Service: Gastroenterology;  Laterality: N/A;  . HARDWARE REMOVAL N/A 07/02/2012   Procedure: HARDWARE REMOVAL;  Surgeon: Elaina Hoops, MD;  Location: [place] [facility];  Service: Neurosurgery;  Laterality: N/A;  . HIATAL HERNIA REPAIR     wrap  . HIP CLOSED REDUCTION Left 08/15/2018   Procedure: CLOSED REDUCTION HIP;  Surgeon: Newt Minion, MD;  Location: [place];  Service: Orthopedics;  Laterality: Left;  . HIP CLOSED REDUCTION Left 08/28/2018   Procedure: CLOSED REDUCTION HIP FOR  RECURRENT DISLOCATION AND DRESSING CHANGE;  Surgeon: Jessy Oto, MD;  Location: [place];  Service: Orthopedics;  Laterality: Left;  . HIP CLOSED REDUCTION Left 09/15/2018   Procedure: CLOSED REDUCTION HIP DISLOCATION;  Surgeon: Mcarthur Rossetti, MD;  Location: [place];  Service: Orthopedics;  Laterality: Left;  . HIP CLOSED REDUCTION Left 10/31/2018   Procedure: CLOSED REDUCTION LEFT TOTAL HIP;  Surgeon: Leandrew Koyanagi, MD;  Location: [place];  Service: Orthopedics;  Laterality: Left;  . INGUINAL HERNIA REPAIR Bilateral   . JOINT REPLACEMENT Left 1999  . KNEE ARTHROPLASTY  07/22/2011   Procedure: COMPUTER ASSISTED TOTAL KNEE ARTHROPLASTY;  Surgeon: Meredith Pel, MD;  Location: [place];  Service: Orthopedics;  Laterality: Right;  Right total knee arthroplasty  . MANDIBLE SURGERY Bilateral    tmj  . REPLACEMENT TOTAL KNEE Bilateral   . ring around testicle hernia reapir  184 and 1986   x 2  . ROTATOR CUFF REPAIR Right   . SHOULDER SURGERY Left   . SPINE SURGERY  2010   "rod and screws", "failed", lopwer spine,   . stent to heart x 1  2010  .  TONSILLECTOMY    . TOTAL HIP ARTHROPLASTY Left 08/13/2018   Procedure: LEFT TOTAL HIP ARTHROPLASTY ANTERIOR APPROACH;  Surgeon: Mcarthur Rossetti, MD;  Location: [place];  Service: Orthopedics;  Laterality: Left;  . TOTAL HIP ARTHROPLASTY Left 09/17/2018   Procedure: ANTERIOR LEFT HIP REVISION;  Surgeon: Mcarthur Rossetti, MD;  Location: [place];  Service: Orthopedics;  Laterality: Left;  . UMBILICAL HERNIA REPAIR     x 1  . varicose vein     stripping  . VIDEO BRONCHOSCOPY Bilateral 10/16/2015   Procedure: VIDEO BRONCHOSCOPY WITHOUT FLUORO;  Surgeon: Brand Males, MD;  [facility];  Service: Cardiopulmonary;  Laterality: Bilateral;   Family History  Problem Relation Age of Onset  . Breast cancer Mother   . Hypertension Mother   . Hyperlipidemia Mother   . Diabetes Mother   . Prostate cancer Father   . Colon polyps Father    . Hyperlipidemia Father   . Crohn's disease Paternal Aunt   . Diabetes Maternal Grandmother   . Diabetes Brother        x 3  . Heart attack Brother   . Heart disease Brother        x 3  . Heart attack Brother   . Hyperlipidemia Brother        x 3  . Alcohol abuse Daughter   . Drug abuse Daughter   . Asthma Brother   . CVA Brother   . Colon cancer Neg Hx   . Esophageal cancer Neg Hx   . Rectal cancer Neg Hx   . Stomach cancer Neg Hx    Social History:  reports that he has never smoked. He has never used smokeless tobacco. He reports previous alcohol use. He reports previous drug use. Drug: Marijuana. Allergies:  Allergies  Allergen Reactions  . Golimumab Anaphylaxis    Simponi ARIA  . Orencia [Abatacept] Anaphylaxis  . Other Anaphylaxis and Hives    Pecans  . Peanut-Containing Drug Products Anaphylaxis, Hives and Swelling    Swelling of throat  . Morphine Other (See Comments)    Severe headache- Can tolerate Dilaudid, however  . Oxycodone-Acetaminophen Other (See Comments)    Headache 6.22.2020 patient is currently taking and tolerating  . Promethazine Hcl Other (See Comments)    Makes him feel "drunk" at higher strengths   Medications Prior to Admission  Medication Sig Dispense Refill  . acetaminophen (TYLENOL) 500 MG tablet Take 500-1,000 mg by mouth every 6 (six) hours as needed for mild pain or moderate pain.     [name] ampicillin IVPB Inject 12 g into the vein daily. As a continuous infusion  Indication: hip infection First Dose: Yes Last Day of Therapy:  12/20/2019 Labs - Once weekly:  CBC/D and BMP, Labs - Every other week:  ESR and CRP Method of administration: Ambulatory Pump (Continuous Infusion) Method of administration may be changed at the discretion of home infusion pharmacist based upon assessment of the patient and/or caregiver's ability to self-administer the medication ordered. 41 Units 0  . atorvastatin (LIPITOR) 10 MG tablet TAKE 1 TABLET BY MOUTH AT  BEDTIME (Patient taking differently: Take 10 mg by mouth at bedtime. ) 30 tablet 9  . bictegravir-emtricitabine-tenofovir AF (BIKTARVY) 50-200-25 MG TABS tablet Take 1 tablet by mouth daily.     . clindamycin (CLEOCIN) 300 MG capsule Take two by mouth one hour before dental appointment, then two by mouth six hours after appointment (Patient taking differently: Take 600 mg by mouth See  admin instructions. Take two by mouth one hour before dental appointment, then two by mouth six hours after appointment) 12 capsule 0  . darunavir-cobicistat (PREZCOBIX) 800-150 MG tablet Take 1 tablet by mouth daily with breakfast.     . EPINEPHrine (EPIPEN 2-PAK) 0.3 mg/0.3 mL IJ SOAJ injection Inject 0.3 mg into the muscle as needed for anaphylaxis.    [name] escitalopram (LEXAPRO) 20 MG tablet Take 1 tablet (20 mg total) by mouth daily. 90 tablet 1  . ezetimibe (ZETIA) 10 MG tablet Take 1 tablet (10 mg total) by mouth daily. (Patient taking differently: Take 10 mg by mouth at bedtime. ) 90 tablet 3  . folic acid (FOLVITE) 1 MG tablet Take 1 mg by mouth daily.  11  . HYDROcodone-acetaminophen (NORCO) 10-325 MG tablet Take 1 tablet by mouth 2 (two) times daily as needed (pain.).    [name] l-methylfolate-B6-B12 (METANX) 3-35-2 MG TABS tablet Take 1 tablet by mouth 2 (two) times daily. 60 tablet 5  . leflunomide (ARAVA) 20 MG tablet Take 20 mg by mouth daily.     . metoprolol succinate (TOPROL-XL) 50 MG 24 hr tablet Take 1 tablet (50 mg total) by mouth daily. 90 tablet 3  . nitroGLYCERIN (NITROSTAT) 0.4 MG SL tablet Place 1 tablet (0.4 mg total) under the tongue every 5 (five) minutes as needed for chest pain. 25 tablet 2  . ondansetron (ZOFRAN) 4 MG tablet Take 4 mg by mouth every 6 (six) hours as needed for nausea.    . predniSONE (DELTASONE) 5 MG tablet Take 5 mg by mouth 2 (two) times daily.   1  . pregabalin (LYRICA) 200 MG capsule TAKE 1 CAPSULE BY MOUTH TWICE A DAY (Patient taking differently: Take 200 mg by mouth 2 (two)  times daily. ) 60 capsule 3  . SAVAYSA 30 MG TABS tablet TAKE 1 TABLET (30 MG TOTAL) BY MOUTH DAILY. (Patient taking differently: Take 30 mg by mouth daily. ) 30 tablet 5  . sulfamethoxazole-trimethoprim (BACTRIM DS) 800-160 MG tablet TAKE 1 TABLET BY MOUTH EVERY 12 HOURS (Patient taking differently: Take 1 tablet by mouth in the morning and at bedtime. ) 60 tablet 3  . sulfaSALAzine (AZULFIDINE) 500 MG tablet Take 500 mg by mouth in the morning and at bedtime.     . tamsulosin (FLOMAX) 0.4 MG CAPS capsule TAKE 1 CAPSULE (0.4 MG TOTAL) BY MOUTH DAILY AFTER SUPPER. 30 capsule 1  . torsemide (DEMADEX) 20 MG tablet TAKE 1 TABLET BY MOUTH EVERY DAY (Patient taking differently: Take 20 mg by mouth daily. ) 90 tablet 1  . valACYclovir (VALTREX) 500 MG tablet TAKE 1 TABLET BY MOUTH EVERY DAY (Patient taking differently: Take 500 mg by mouth daily. ) 30 tablet 4    Drug Regimen Review Drug regimen was reviewed and remains appropriate with no significant issues identified  Home: Home Living Family/patient expects to be discharged to:: Private residence Living Arrangements: Alone Available Help at Discharge: Family, Available PRN/intermittently Type of Home: House Home Access: Stairs to enter Technical brewer of Steps: 5 Entrance Stairs-Rails: Right, Left Home Layout: One level Bathroom Toilet: Standard Home Equipment: Environmental consultant - 2 wheels, Cane - single point Additional Comments: has been able to walk well until recent increase in L hip pain   Functional History: Prior Function Level of Independence: Independent with assistive device(s) Comments: mostly rollator used  Functional Status:  Mobility: Bed Mobility Overal bed mobility: Needs Assistance Bed Mobility: Supine to Sit Supine to sit: Min guard General bed  mobility comments: Pt able to complete bed mobility min guard for safety and maintaining WB precautions Transfers Overall transfer level: Needs assistance Equipment used:  Bilateral platform walker, 4-wheeled walker Transfers: Sit to/from Stand, Stand Pivot Transfers Sit to Stand: Min guard Stand pivot transfers: Min guard  Lateral/Scoot Transfers: Min assist, From elevated surface General transfer comment: Pt able to sit to stand and stand pivot transfer min guard, while maintaining precautions and [name] walker. Sit to stand x2. Ambulation/Gait Ambulation/Gait assistance: Min assist Gait Distance (Feet): 1 Feet Assistive device: 4-wheeled walker, Bilateral platform walker Gait Pattern/deviations: Step-to pattern, Decreased stride length, Trunk flexed General Gait Details: Pt able to stand pivot transfer to chair, maintaining hip and WB precautions.  ADL:  Cognition: Cognition Overall Cognitive Status: Within Functional Limits for tasks assessed Orientation Level: Oriented X4 Cognition Arousal/Alertness: Awake/alert Behavior During Therapy: WFL for tasks assessed/performed Overall Cognitive Status: Within Functional Limits for tasks assessed  Physical Exam: There were no vitals taken for this visit. General: Alert and oriented x 3, No apparent distress HEENT: Head is normocephalic, atraumatic, PERRLA, EOMI, sclera anicteric, oral mucosa pink and moist, dentition intact, ext ear canals clear,  Neck: Supple without JVD or lymphadenopathy Heart: Reg rate and rhythm. No murmurs rubs or gallops Chest: CTA bilaterally without wheezes, rales, or rhonchi; no distress Abdomen: Soft, non-tender, non-distended, bowel sounds positive. Extremities: No clubbing, cyanosis, or edema. Pulses are 2+ Skin:    Comments: Left hip incision is dressed with some bloody drainage.  No odor noted.  Appropriately tender  Neurological:     Comments: Patient is alert in no acute distress and follows commands.  Oriented x3 and cooperative with exam.   No results found. However, due to the size of the patient record, not all encounters were searched. Please check Results Review  for a complete set of results. No results found.  Medical Problem List and Plan: 1.  Decreased functional mobility secondary to failed left hip acetabular component with history of left total hip arthroplasty April of last year complicated by multiple dislocations and recent fall  -patient may shower but incision must be covered.   -ELOS/Goals: modI in 10-14 days 2.  Antithrombotics: -DVT/anticoagulation: SCDs.  Check vascular study  -antiplatelet therapy: Aspirin 81 mg twice daily 3. Pain Management: Lyrica 200 mg twice daily, oxycodone and Robaxin as needed. Pain is currently well controlled.  4. Mood: Lexapro 20 mg daily  -antipsychotic agents: N/A 5. Neuropsych: This patient is capable of making decisions on his own behalf. 6. Skin/Wound Care: Daily inspection of left hip. Incision is currently with bloody drainage, to be expected as per ortho.  7. Fluids/Electrolytes/Nutrition: Routine in and outs with follow-up chemistries 8.  Acute blood loss anemia.  Follow-up CBC. 6/29 Hgb 7.7 9.  HIV diagnosed 44.  Follow-up infectious disease.  Continue continue antiretroviral medications 10.  ID/enterococcal prosthetic infection left hip.  Continue intravenous ampicillin 2 g every 4 hours x6 weeks then converting to oral amoxicillin.  Follow-up per infectious disease Dr. Michel Bickers 11.  Hypertension.  Toprol 50 mg daily, Demadex 20 mg daily.  Monitor with increased mobility  7/1: Currently hypotensive. HR well controlled. Decrease Torsemide to 40m daily.  12.  Hyperlipidemia.  Lipitor/Zetia 13.  BPH.  Flomax 0.4 mg daily.   DHelyn Numbers PA-C  I have personally performed a face to face diagnostic evaluation, including, but not limited to relevant history and physical exam findings, of this patient and developed relevant assessment and plan.  Additionally, I  have reviewed and concur with the physician assistant's documentation above.  The patient's status has not changed. The original post  admission physician evaluation remains appropriate, and any changes from the pre-admission screening or documentation from the acute chart are noted above.   Leeroy Cha, MD

## 2019-11-10 NOTE — Progress Notes (Signed)
Inpatient Rehabilitation Admissions Coordinator  I have CIR bed available to admit patient to today and patient is in agreement to admit to private room. I will make the arrangements.   Danne Baxter, RN, MSN Rehab Admissions Coordinator [phone] 11/10/2019 10:48 AM

## 2019-11-10 NOTE — Progress Notes (Signed)
Physical Therapy Treatment Patient Details Name: [name] MRN: [id number] DOB: Apr 26, 1951 Today's Date: 11/10/2019    History of Present Illness 69 yo male with onset of hard mechanical fall resulting in pelvic fracture around a previous L THA.  Since then has been quite painful and is admitted for revision of L acetabular component with bone graft and dual mobility to ease tendency to dislocate L hip. Permitted TDWB with transfers only.  PMHx:  avascular necrosis L hip, THA L hip, mult dislocations L hip, pelvic fracture, full length spinal fusion, BPH, bronchitis, TIA, Covid 19, CAP, HIV related neuropathy, aortic atherosclerosis, heart murmur, cerebellar ataxia,      PT Comments    Pt presented in bed. Pt able to complete bed mobility min guard, while maintaining precautions. Pt verbalized and maintained precautions during session. Pt able to sit to stand and stand pivot transfer to chair with [name] walker up to min(A) for maintaining WB precautions and hip precautions. Pt unable to amb at this time and will benefit from CIR level therapies upon d/c. Will continue to follow acutely.    Follow Up Recommendations  CIR     Equipment Recommendations  None recommended by PT    Recommendations for Other Services       Precautions / Restrictions Precautions Precautions: Fall;Posterior Hip (History of past hip dislocations) Precaution Comments: TDWB on LLE Restrictions Weight Bearing Restrictions: Yes LLE Weight Bearing: Touchdown weight bearing Other Position/Activity Restrictions: monitor ROM on L hip to within comfort    Mobility  Bed Mobility Overal bed mobility: Needs Assistance Bed Mobility: Supine to Sit     Supine to sit: Min guard     General bed mobility comments: Pt able to complete bed mobility min guard for safety and maintaining WB precautions  Transfers Overall transfer level: Needs assistance Equipment used: Bilateral platform walker;4-wheeled  walker Transfers: Sit to/from Stand;Stand Pivot Transfers Sit to Stand: Min guard Stand pivot transfers: Min guard       General transfer comment: Pt able to sit to stand and stand pivot transfer min guard, while maintaining precautions and Eva walker. Sit to stand x2.  Ambulation/Gait Ambulation/Gait assistance: Min assist Gait Distance (Feet): 1 Feet Assistive device: 4-wheeled walker;Bilateral platform walker Gait Pattern/deviations: Step-to pattern;Decreased stride length;Trunk flexed     General Gait Details: Pt able to stand pivot transfer to chair, maintaining hip and WB precautions.   Stairs             Wheelchair Mobility    Modified Rankin (Stroke Patients Only)       Balance Overall balance assessment: Needs assistance Sitting-balance support: Feet supported Sitting balance-[name]: Good     Standing balance support: Bilateral upper extremity supported Standing balance-[name]: Poor Standing balance comment: heavy UE support for balance                            Cognition Arousal/Alertness: Awake/alert Behavior During Therapy: WFL for tasks assessed/performed Overall Cognitive Status: Within Functional Limits for tasks assessed                                        Exercises Total Joint Exercises Ankle Circles/Pumps: AROM;10 reps;Both;Seated Long Arc Quad: AROM;Left;10 reps;Seated    General Comments General comments (skin integrity, edema, etc.): MD came in mid session to change pt dressing. Pt able  to recall precautions and maintained precautions during session. Pt requested to use [name] walker.      Pertinent Vitals/Pain Pain Assessment: No/denies pain    Home Living                      Prior Function            PT Goals (current goals can now be found in the care plan section) Acute Rehab PT Goals PT Goal Formulation: With patient Time For Goal Achievement: 11/18/19 Potential to  Achieve Goals: Good Progress towards PT goals: Progressing toward goals    Frequency    Min 5X/week      PT Plan Frequency needs to be updated    Co-evaluation              AM-PAC PT "6 Clicks" Mobility   Outcome Measure  Help needed turning from your back to your side while in a flat bed without using bedrails?: None Help needed moving from lying on your back to sitting on the side of a flat bed without using bedrails?: None Help needed moving to and from a bed to a chair (including a wheelchair)?: A Little Help needed standing up from a chair using your arms (e.g., wheelchair or bedside chair)?: None Help needed to walk in hospital room?: Total Help needed climbing 3-5 steps with a railing? : Total 6 Click Score: 17    End of Session Equipment Utilized During Treatment: Gait belt Activity Tolerance: Patient tolerated treatment well Patient left: in chair;with call bell/phone within reach;with chair alarm set Nurse Communication: Mobility status PT Visit Diagnosis: Muscle weakness (generalized) (M62.81);Other abnormalities of gait and mobility (R26.89);Difficulty in walking, not elsewhere classified (R26.2);Pain Pain - Right/Left: Left Pain - part of body: Hip     Time: 7517-0017 PT Time Calculation (min) (ACUTE ONLY): 17 min  Charges:  $Therapeutic Activity: 8-22 mins                     [employer] SPT 11/10/2019    Rolland Porter 11/10/2019, 11:20 AM

## 2019-11-10 NOTE — Progress Notes (Signed)
Patient ID: [name], male   DOB: 18-Feb-1951, 69 y.o.   MRN: [id number] [name] looks good today.  He reports decreased pain and feels better overall.  His vitals have been stable and he is afebrile.  His left hip incision does have bloody drainage and this is to be expected.  He has had multiple surgeries on this.  And every time he does have bloody drainage.  All other each time just reinforced the dressing and keep changing dressings as needed.  He does not need any further surgery from my standpoint.  He can be discharged to CIR today if possible.

## 2019-11-10 NOTE — Discharge Summary (Signed)
Patient ID: [name] MRN: [id number] DOB/AGE: 09/25/1950 69 y.o.  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 Allergy   . Anxiety   . Carotid artery occlusion    40-60% right ICA stenosis (09/2008)  . Cataract   . Chronic back pain   . CLL (chronic lymphoblastic leukemia) dx 2010   Followed at [facility] q12[facility], no current therapy   . Clotting disorder (HCC)   . Coronary artery disease 2010   s/p CABG '10, sees Dr. Hochrein  . Depression   . Diverticulosis   . DVT, lower extremity, recurrent (HCC) 2008, 2009   LLE, chronic anticoag since 2009  . Esophagitis   . Fibromyalgia   . Gallstones   . GERD (gastroesophageal reflux disease)   . Gout   . Gynecomastia, male   . H/O hiatal hernia 2008   surgery  . Hemorrhoids   . Hepatitis A yrs ago  . HIV infection (HCC) dx 1993  . Hypertension   . Impotence of organic origin   . Myocardial infarction (HCC) 2010    x 2  . Neuromuscular disorder (HCC)    neuropathy  . Osteoarthritis, knee    s/p B TKA  . Osteoporosis   . PONV (postoperative nausea and vomiting)   . Pulmonary fibrosis (HCC) 2017  . Rheumatoid arthritis(714.0) dx 2010   MTX, follows with rheum  . Seasonal allergies   . Secondary syphilis 07/24/14 dx   s/p 2 wks doxy  . Status post dilation of esophageal narrowing   . Stroke (HCC) 1969   TIA   . TIA (transient ischemic attack) 1997   mild residual L mouth droop  . Tubular adenoma of colon     Surgeries: Procedure(s): REVISION LEFT HIP ACETABULAR COMPONENT on 11/03/2019   Consultants:   Discharged Condition: Improved  Hospital Course: Gianno L Parslow is an 69 y.o. male who was admitted 11/03/2019 for operative treatment ofProsthetic joint infection of left hip (HCC). Patient has severe unremitting pain that affects sleep, daily activities, and work/hobbies. After pre-op clearance the patient was taken to the operating room on 11/03/2019 and underwent  Procedure(s): REVISION LEFT HIP ACETABULAR COMPONENT.    Patient was given perioperative antibiotics:  Anti-infectives (From admission, onward)   Start     Dose/Rate Route Frequency Ordered Stop   11/09/19 1600  ampicillin (OMNIPEN) 2 g in sodium chloride 0.9 % 100 mL IVPB     Discontinue     2 g 300 mL/hr over 20 Minutes Intravenous Every 4 hours 11/09/19 1017     11/09/19 1230  fluconazole (DIFLUCAN) tablet 150 mg        150 mg Oral  Once 11/09/19 1138 11/09/19 1455   11/09/19 0000  ampicillin IVPB     Discontinue     12 g Intravenous Every 24 hours 11/09/19 1146 12/20/19 2359     11/08/19 1700  amoxicillin (AMOXIL) capsule 500 mg        500 mg Oral  Once 11/08/19 1456 11/08/19 1716   11/08/19 0800  vancomycin (VANCOCIN) IVPB 1000 mg/200 mL premix  Status:  Discontinued        1,000 mg 200 mL/hr over 60 Minutes Intravenous Every 12 hours 11/07/19 1812 11/09/19 1017   11/07/19 1815  vancomycin (VANCOREADY) IVPB 1500 mg/300 mL        1,500 mg 150 mL/hr over 120 Minutes Intravenous  Once 11/07/19 1812 11/07/19 2105   11/04/19 1000  bictegravir-emtricitabine-tenofovir AF (BIKTARVY) 50-200-25 MG per tablet 1 tablet     Discontinue     1 tablet Oral Daily 11/03/19 1718     11/04/19 1000  valACYclovir (VALTREX) tablet 500 mg     Discontinue     500 mg Oral Daily 11/03/19 1718     11/04/19 0800  darunavir-cobicistat (PREZCOBIX)  800-150 MG per tablet 1 tablet     Discontinue     1 tablet Oral Daily with breakfast 11/03/19 1718     11/03/19 2200  sulfamethoxazole-trimethoprim (BACTRIM DS) 800-160 MG per tablet 1 tablet     Discontinue     1 tablet Oral Every 12 hours 11/03/19 1718     11/03/19 1800  clindamycin (CLEOCIN) IVPB 600 mg        600 mg 100 mL/hr over 30 Minutes Intravenous Every 6 hours 11/03/19 1718 11/04/19 0512   11/03/19 0600  clindamycin (CLEOCIN) IVPB 900 mg        900 mg 100 mL/hr over 30 Minutes Intravenous On call to O.R. 11/03/19 0553 11/03/19 0800       Patient was given sequential compression devices, early ambulation, and chemoprophylaxis to prevent DVT.  Patient benefited maximally from hospital stay and there were no complications.    Recent vital signs:  Patient Vitals for the past 24 hrs:  BP Temp Temp src Pulse Resp SpO2  11/10/19 0332 106/64 98.1 F (36.7 C) Oral 76 16 94 %  11/09/19 1939 105/68 99 F (37.2 C) Oral 85 18 95 %  11/09/19 1544 108/66 98.2 F (36.8 C) Oral 85 17 93 %     Recent laboratory studies:  Recent Labs    11/08/19 0433  WBC 9.2  HGB 7.7*  HCT 24.7*  PLT 97*  NA 136  K 3.5  CL 102  CO2 27  BUN 10  CREATININE 0.68  GLUCOSE 116*  CALCIUM 7.7*     Discharge Medications:   Allergies as of 11/10/2019      Reactions   Golimumab Anaphylaxis   Simponi ARIA   Orencia [abatacept] Anaphylaxis   Other Anaphylaxis, Hives   Pecans   Peanut-containing Drug Products Anaphylaxis, Hives, Swelling   Swelling of throat   Morphine Other (See Comments)   Severe headache- Can tolerate Dilaudid, however   Oxycodone-acetaminophen Other (See Comments)   Headache 6.22.2020 patient is currently taking and tolerating   Promethazine Hcl Other (See Comments)   Makes him feel "drunk" at higher strengths      Medication List    TAKE these medications   acetaminophen 500 MG tablet Commonly known as: TYLENOL Take 500-1,000 mg by mouth every 6 (six) hours as  needed for mild pain or moderate pain.   ampicillin  IVPB Inject 12 g into the vein daily. As a continuous infusion  Indication: hip infection First Dose: Yes Last Day of Therapy:  12/20/2019 Labs - Once weekly:  CBC/D   and BMP, Labs - Every other week:  ESR and CRP Method of administration: Ambulatory Pump (Continuous Infusion) Method of administration may be changed at the discretion of home infusion pharmacist based upon assessment of the patient and/or caregiver's ability to self-administer the medication ordered.   atorvastatin 10 MG tablet Commonly known as: LIPITOR TAKE 1 TABLET BY MOUTH AT BEDTIME   Biktarvy 50-200-25 MG Tabs tablet Generic drug: bictegravir-emtricitabine-tenofovir AF Take 1 tablet by mouth daily.   clindamycin 300 MG capsule Commonly known as: Cleocin Take two by mouth one hour before dental appointment, then two by mouth six hours after appointment What changed:   how much to take  how to take this  when to take this   EpiPen 2-Pak 0.3 mg/0.3 mL Soaj injection Generic drug: EPINEPHrine Inject 0.3 mg into the muscle as needed for anaphylaxis.   escitalopram 20 MG tablet Commonly known as: LEXAPRO Take 1 tablet (20 mg total) by mouth daily.   ezetimibe 10 MG tablet Commonly known as: ZETIA Take 1 tablet (10 mg total) by mouth daily. What changed: when to take this   folic acid 1 MG tablet Commonly known as: FOLVITE Take 1 mg by mouth daily.   HYDROcodone-acetaminophen 10-325 MG tablet Commonly known as: NORCO Take 1 tablet by mouth 2 (two) times daily as needed (pain.).   l-methylfolate-B6-B12 3-35-2 MG Tabs tablet Commonly known as: METANX Take 1 tablet by mouth 2 (two) times daily.   leflunomide 20 MG tablet Commonly known as: ARAVA Take 20 mg by mouth daily.   metoprolol succinate 50 MG 24 hr tablet Commonly known as: TOPROL-XL Take 1 tablet (50 mg total) by mouth daily.   nitroGLYCERIN 0.4 MG SL tablet Commonly known as:  NITROSTAT Place 1 tablet (0.4 mg total) under the tongue every 5 (five) minutes as needed for chest pain.   ondansetron 4 MG tablet Commonly known as: ZOFRAN Take 4 mg by mouth every 6 (six) hours as needed for nausea.   predniSONE 5 MG tablet Commonly known as: DELTASONE Take 5 mg by mouth 2 (two) times daily.   pregabalin 200 MG capsule Commonly known as: LYRICA TAKE 1 CAPSULE BY MOUTH TWICE A DAY   Prezcobix 800-150 MG tablet Generic drug: darunavir-cobicistat Take 1 tablet by mouth daily with breakfast.   Savaysa 30 MG Tabs tablet Generic drug: edoxaban TAKE 1 TABLET (30 MG TOTAL) BY MOUTH DAILY. What changed: when to take this   sulfamethoxazole-trimethoprim 800-160 MG tablet Commonly known as: BACTRIM DS TAKE 1 TABLET BY MOUTH EVERY 12 HOURS What changed: when to take this   sulfaSALAzine 500 MG tablet Commonly known as: AZULFIDINE Take 500 mg by mouth in the morning and at bedtime.   tamsulosin 0.4 MG Caps capsule Commonly known as: FLOMAX TAKE 1 CAPSULE (0.4 MG TOTAL) BY MOUTH DAILY AFTER SUPPER.   torsemide 20 MG tablet Commonly known as: DEMADEX TAKE 1 TABLET BY MOUTH EVERY DAY   valACYclovir 500 MG tablet Commonly known as: VALTREX TAKE 1 TABLET BY MOUTH EVERY DAY            Discharge Care Instructions  (From admission, onward)         Start     Ordered   11/09/19 0000  Change dressing on IV access line weekly and PRN  (Home infusion instructions - [facility] )        11/09/19 1146          Diagnostic Studies: CT Chest High Resolution  Result Date: 10/19/2019 CLINICAL DATA:  Shortness of breath, pulmonary fibrosis. EXAM: CT CHEST WITHOUT CONTRAST TECHNIQUE: Multidetector CT imaging of the chest was performed following the standard protocol without intravenous contrast. High resolution imaging of the lungs, as well as inspiratory and expiratory imaging, was performed. COMPARISON:  11/17/2018 and 01/24/2016. FINDINGS:  Cardiovascular: Atherosclerotic calcification of the aorta and aortic valve. Heart size normal. No pericardial effusion. Mediastinum/Nodes: Calcified mediastinal and hilar lymph nodes. No pathologically enlarged mediastinal or axillary lymph nodes. Hilar regions are otherwise difficult to definitively evaluate without IV contrast. Esophagus is grossly unremarkable. Lungs/Pleura: Basilar predominant subpleural reticulation, ground-glass and traction bronchiolectasis. Findings appear more organized/progressive from prior exams. Small left fibrothorax. Rounded atelectasis and scarring in the left lower lobe. No air trapping. Upper Abdomen: Visualized portion of the liver is unremarkable. Cholecystectomy. Visualized portions of the adrenal glands, right kidney, spleen, pancreas, stomach and bowel are unremarkable with the exception of a small hiatal hernia. Elongated peripherally calcified structure between the spleen and stomach is unchanged and has a benign appearance. Musculoskeletal: Postoperative and degenerative changes in the spine. Median sternotomy. Degenerative changes in the shoulders. No worrisome lytic or sclerotic lesions. IMPRESSION: 1. Basilar predominant pattern of subpleural fibrosis appears more organized and progressive from prior exams and may be due to usual interstitial pneumonitis or fibrotic nonspecific interstitial pneumonitis. Findings are indeterminate for UIP per consensus guidelines: Diagnosis of Idiopathic Pulmonary Fibrosis: An Official ATS/ERS/JRS/ALAT Clinical Practice Guideline. Am J Respir Crit Care Med Vol 198, Iss 5, [id number], Jan 10 2017. 2. Small left fibrothorax with adjacent scarring and volume loss in the left lower lobe. 3.  Aortic atherosclerosis (ICD10-I70.0). Electronically Signed   By: Melinda  Blietz M.D.   On: 10/19/2019 12:34   DG Pelvis Portable  Result Date: 11/03/2019 CLINICAL DATA:  Post revision of total hip replacement EXAM: PORTABLE PELVIS 1-2 VIEWS  COMPARISON:  Portable exam 1131 hours compared to intraoperative images of 11/03/2019 FINDINGS: LEFT hip prosthesis identified. Bones demineralized. No acute fracture or dislocation. High position of the LEFT acetabulum. Degenerative changes of the RIGHT hip joint noted. IMPRESSION: Post LEFT hip arthroplasty. No acute abnormalities. Electronically Signed   By: Mark  Boles M.D.   On: 11/03/2019 13:22   DG CHEST PORT 1 VIEW  Result Date: 11/07/2019 CLINICAL DATA:  Chest pain at rest EXAM: PORTABLE CHEST 1 VIEW COMPARISON:  CT 10/18/2019, chest x-ray 08/13/2019 FINDINGS: Post sternotomy changes. Mild basilar fibrosis. Small left effusion or pleural thickening. Enlarged cardiomediastinal silhouette with aortic atherosclerosis. No pneumothorax. IMPRESSION: Cardiomegaly. Mild basilar fibrosis. Small left pleural effusion or pleural thickening. Electronically Signed   By: Kim  Fujinaga M.D.   On: 11/07/2019 20:18   DG C-Arm 1-60 Min  Result Date: 11/03/2019 CLINICAL DATA:  Left hip surgery. EXAM: DG C-ARM 1-60 MIN CONTRAST:  No prior. FLUOROSCOPY TIME:  Fluoroscopy Time: Radiation Exposure Index (if provided by the fluoroscopic device): 4 point 1 mGy COMPARISON:  10/06/2019. CT left hip 08/12/2019. FINDINGS: Total left hip replacement. Hardware intact. Anatomic alignment. True 0 acetabuli noted. No acute abnormality identified. IMPRESSION: Total left hip replacement. Electronically Signed   By: Thomas  Register   On: 11/03/2019 10:46   DG HIP OPERATIVE UNILAT W OR W/O PELVIS LEFT  Result Date: 11/03/2019 CLINICAL DATA:  Left hip surgery. EXAM: OPERATIVE LEFT HIP (WITH PELVIS IF PERFORMED)  VIEWS TECHNIQUE: Fluoroscopic spot image(s) were submitted for interpretation post-operatively. COMPARISON:  Left hip series 10/06/2019 CT 08/12/2019. FINDINGS: Total left hip replacement. Hardware intact. Stable alignment. Stable   protrusio acetabuli. No acute bony abnormality. IMPRESSION: Total left hip replacement with  stable alignment. Electronically Signed   By: Marcello Moores  Register   On: 11/03/2019 10:40   [place] EKG SITE RITE  Result Date: 11/08/2019 If Site Rite image not attached, placement could not be confirmed due to current cardiac rhythm.   Disposition: Discharge disposition: [facility]       Discharge Instructions    [facility] pharmacist to adjust dose for Vancomycin, Aminoglycosides and other anti-infective therapies as requested by physician.   Complete by: As directed    [facility] to provide Cath Flo 74m   Complete by: As directed    Administer for PICC line occlusion and as ordered by physician for other access device issues.   Anaphylaxis Kit: Provided to treat any anaphylactic reaction to the medication being provided to the patient if First Dose or when requested by physician   Complete by: As directed    Epinephrine 121mml vial / amp: Administer 0.38m76m0.38ml69mubcutaneously once for moderate to severe anaphylaxis, nurse to call physician and pharmacy when reaction occurs and call 911 if needed for immediate care   Diphenhydramine 50mg26mIV vial: Administer 25-50mg 21mM PRN for first dose reaction, rash, itching, mild reaction, nurse to call physician and pharmacy when reaction occurs   Sodium Chloride 0.9% NS 500ml I55mdminister if needed for hypovolemic blood pressure drop or as ordered by physician after call to physician with anaphylactic reaction   Change dressing on IV access line weekly and PRN   Complete by: As directed    Flush IV access with Sodium Chloride 0.9% and Heparin 10 units/ml or 100 units/ml   Complete by: As directed    Home infusion instructions - [facility]   Complete by: As directed    Instructions: Flush IV access with Sodium Chloride 0.9% and Heparin 10units/ml or 100units/ml   Change dressing on IV access line: Weekly and PRN   Instructions Cath Flo 2mg: Ad82mister for PICC Line occlusion and as ordered by  physician for other access device   [facility] pharmacist to adjust dose for: Vancomycin, Aminoglycosides and other anti-infective therapies as requested by physician   Method of administration may be changed at the discretion of home infusion pharmacist based upon assessment of the patient and/or caregiver's ability to self-administer the medication ordered   Complete by: As directed        Follow-up Information    BlackmanMcarthur Rossettihedule an appointment as soon as possible for a visit in 2 week(s).   Specialty: Orthopedic Surgery Contact information: [phone]        Signed: ChristopMcarthur Rossetti1, 8:45 AM

## 2019-11-10 NOTE — Plan of Care (Signed)
  Problem: Health Behavior/Discharge Planning: Goal: Ability to manage health-related needs will improve Outcome: Progressing   Problem: Activity: Goal: Risk for activity intolerance will decrease Outcome: Progressing   Problem: Pain Managment: Goal: General experience of comfort will improve Outcome: Progressing   

## 2019-11-10 NOTE — Progress Notes (Signed)
Izora Ribas, MD  Physician  Physical Medicine and Rehabilitation  PMR Pre-admission     Signed  Date of Service:  11/06/2019  3:00 PM      Related encounter: Admission (Discharged) from 11/03/2019 in [facility]       Show:Clear all '[x]'$ Manual'[x]'$ Template'[x]'$ Copied  Added by: '[x]'$ Cristian Grieves, Vertis Kelch, RN'[x]'$ Raulkar, Clide Deutscher, MD'[x]'$ Genella Mech, CCC-SLP  '[]'$ Hover for details PMR Admission Coordinator Pre-Admission Assessment   Patient: [name] is an 69 y.o., male MRN: [id number] DOB: 02-06-1951 Height: '5\' 9"'$  (175.3 cm) Weight: 78.9 kg   Insurance Information HMO:     PPO:      PCP:      IPA:      80/20:      OTHER:  PRIMARY: Medicare A &  B    Policy#: [id number]     Subscriber: pt CM Name:       Phone#:      Fax#:  Pre-Cert#: verified online 6/27  via passport One    Employer: n/a Benefits:  Phone #:      Name:  Eff. Date: 11/10/1995 A and B     Deduct: $1484      Out of Pocket Max: n/a      Life Max: n/a CIR: 100%      SNF: 20 full days Outpatient: 80%     Co-Pay: 20% Home Health: 100%      Co-Pay:  DME: 80%     Co-Pay: 20% Providers:  Pt choice     The "Data Collection Information Summary" for patients in Inpatient Rehabilitation Facilities with attached "Privacy Act [place] Records" was provided and verbally reviewed with: patient   Emergency Contact Information         Contact Information     Name Relation Home Work Mobile    Orlowski,Tricia Daughter [phone]             Current Medical History  Patient Admitting Diagnosis: Total Hip Revision    History of Present Illness:  70 year old right-handed male with history of chronic back pain/fibromyalgia, CLL currently on no therapy, CAD with CABG 2010, hypertension, HIV infection diagnosed 1993, TIA 1997, lateral knee surgery, patient underwent left total hip arthroplasty April of last year postoperatively he had instability and recurrent  dislocations requiring several other surgeries through June of last year and in late October he fell sustaining a periprosthetic fracture around the femoral stem.  A CT scan in December showed some lucency around some of the acetabular screws.  Presented 11/03/2019 after mechanical fall without loss of consciousness.  X-rays and imaging revealed progressive loosening of left hip acetabular component.  The acetabular component had become more medialized and more vertical after fall.  There were noted fracture lines in the pelvis on CT scan.  Patient underwent revision left hip acetabular component 11/03/2019 per Dr. Ninfa Linden.  Touchdown weightbearing left lower extremity.  Maintain on aspirin 81 mg twice daily for DVT prophylaxis.  Acute blood loss anemia 7.7.  Wound culture showed Enterococcus with infectious disease consulted Dr. Michel Bickers and placed on IV vancomycin initially and transitioned to intravenous ampicillin that he will continue on for 6 weeks before converting to oral amoxicillin.      Patient[facility] medical [facility] from [facility] has been reviewed by the rehabilitation admission coordinator and physician.   Past Medical History      Past Medical History:  Diagnosis Date  .  Allergy    . Anxiety    . Carotid artery occlusion      40-60% right ICA stenosis (09/2008)  . Cataract    . Chronic back pain    . CLL (chronic lymphoblastic leukemia) dx 2010    Followed at [facility]23[facility] no current therapy   . [place]otting disorder ([facility]    . Coronary artery disease 2010    s/p CABG '10, sees Dr. HPercival Spanish . Depression    . Diverticulosis    . DVT, lower extremity, recurrent ([facility] 2008, 2009    LLE, chronic anticoag since 2009  . Esophagitis    . Fibromyalgia    . Gallstones    . GERD (gastroesophageal reflux disease)    . Gout    . Gynecomastia, male    . H/O hiatal hernia 2008    surgery  . Hemorrhoids    . Hepatitis A yrs ago  . HIV infection ([facility] dx 1993  .  Hypertension    . Impotence of organic origin    . Myocardial infarction ([facility] 2010     x 2  . Neuromuscular disorder (HCC)      neuropathy  . Osteoarthritis, knee      s/p B TKA  . Osteoporosis    . PONV (postoperative nausea and vomiting)    . Pulmonary fibrosis ([facility] 2017  . Rheumatoid arthritis(714.0) dx 2010    MTX, follows with rheum  . Seasonal allergies    . Secondary syphilis 07/24/14 dx    s/p 2 wks doxy  . Status post dilation of esophageal narrowing    . Stroke ([facility] 1969    TIA  . TIA (transient ischemic attack) 1997    mild residual L mouth droop  . Tubular adenoma of colon        Family History   family history includes Alcohol abuse in his daughter; Asthma in his brother; Breast cancer in his mother; CVA in his brother; Colon polyps in his father; Crohn's disease in his paternal aunt; Diabetes in his brother, maternal grandmother, and mother; Drug abuse in his daughter; Heart attack in his brother and brother; Heart disease in his brother; Hyperlipidemia in his brother, father, and mother; Hypertension in his mother; Prostate cancer in his father.   Prior Rehab/Hospitalizations Has the patient had prior rehab or hospitalizations prior to admission? Yes   Has the patient had major surgery during 100 days prior to admission? Yes              Current Medications   Current Facility-Administered Medications:  .  0.9 %  sodium chloride infusion, , Intravenous, Continuous, BMcarthur Rossetti MD, Stopped at 11/08/19 1818 .  acetaminophen (TYLENOL) tablet 325-650 mg, 325-650 mg, Oral, Q6H PRN, BMcarthur Rossetti MD, 650 mg at 11/09/19 2146 .  ampicillin (OMNIPEN) 2 g in sodium chloride 0.9 % 100 mL IVPB, 2 g, Intravenous, Q4H, CMichel Bickers MD, Last Rate: 300 mL/hr at 11/10/19 0813, 2 g at 11/10/19 0813 .  aspirin chewable tablet 81 mg, 81 mg, Oral, BID, BMcarthur Rossetti MD, 81 mg at 11/10/19 0811 .  atorvastatin (LIPITOR) tablet 10 mg, 10 mg, Oral,  QHS, BMcarthur Rossetti MD, 10 mg at 11/09/19 2146 .  bictegravir-emtricitabine-tenofovir AF (BIKTARVY) 50-200-25 MG per tablet 1 tablet, 1 tablet, Oral, Daily, BMcarthur Rossetti MD, 1 tablet at 11/10/19 0809 .  Chlorhexidine Gluconate Cloth 2 % PADS 6 each, 6 each, Topical, Daily, BMcarthur Rossetti MD, 6 each  at 11/10/19 0814 .  darunavir-cobicistat (PREZCOBIX) 800-150 MG per tablet 1 tablet, 1 tablet, Oral, Q breakfast, Mcarthur Rossetti, MD, 1 tablet at 11/10/19 0809 .  diphenhydrAMINE (BENADRYL) 12.5 MG/5ML elixir 12.5-25 mg, 12.5-25 mg, Oral, Q4H PRN, Mcarthur Rossetti, MD .  diphenhydrAMINE (BENADRYL) injection 25 mg, 25 mg, Intravenous, Once PRN, Michel Bickers, MD .  docusate sodium (COLACE) capsule 100 mg, 100 mg, Oral, BID, Mcarthur Rossetti, MD, 100 mg at 11/10/19 0809 .  edoxaban (SAVAYSA) tablet 30 mg, 30 mg, Oral, Daily, Mcarthur Rossetti, MD, 30 mg at 11/10/19 0811 .  EPINEPHrine (EPI-PEN) injection 0.3 mg, 0.3 mg, Intramuscular, Once PRN, Michel Bickers, MD .  escitalopram (LEXAPRO) tablet 20 mg, 20 mg, Oral, Daily, Mcarthur Rossetti, MD, 20 mg at 11/10/19 0809 .  ezetimibe (ZETIA) tablet 10 mg, 10 mg, Oral, QHS, Mcarthur Rossetti, MD, 10 mg at 11/09/19 2149 .  folic acid (FOLVITE) tablet 1 mg, 1 mg, Oral, Daily, Mcarthur Rossetti, MD, 1 mg at 11/10/19 0810 .  folic acid-pyridoxine-cyancobalamin (FOLTX) 2.5-25-2 MG per tablet 1 tablet, 1 tablet, Oral, BID, Blenda Nicely, [facility], 1 tablet at 11/10/19 0810 .  HYDROmorphone (DILAUDID) injection 0.5-1 mg, 0.5-1 mg, Intravenous, Q2H PRN, Mcarthur Rossetti, MD, 1 mg at 11/06/19 2101 .  leflunomide (ARAVA) tablet 20 mg, 20 mg, Oral, Daily, Mcarthur Rossetti, MD, 20 mg at 11/10/19 0810 .  menthol-cetylpyridinium (CEPACOL) lozenge 3 mg, 1 lozenge, Oral, PRN **OR** phenol (CHLORASEPTIC) mouth spray 1 spray, 1 spray, Mouth/Throat, PRN, Mcarthur Rossetti, MD .   methocarbamol (ROBAXIN) tablet 500 mg, 500 mg, Oral, Q6H PRN, 500 mg at 11/09/19 0617 **OR** methocarbamol (ROBAXIN) 500 mg in dextrose 5 % 50 mL IVPB, 500 mg, Intravenous, Q6H PRN, Mcarthur Rossetti, MD .  metoCLOPramide (REGLAN) tablet 5-10 mg, 5-10 mg, Oral, Q8H PRN **OR** metoCLOPramide (REGLAN) injection 5-10 mg, 5-10 mg, Intravenous, Q8H PRN, Mcarthur Rossetti, MD .  metoprolol succinate (TOPROL-XL) 24 hr tablet 50 mg, 50 mg, Oral, Daily, Mcarthur Rossetti, MD, 50 mg at 11/10/19 0810 .  nitroGLYCERIN (NITROSTAT) SL tablet 0.4 mg, 0.4 mg, Sublingual, Q5 min PRN, Mcarthur Rossetti, MD .  ondansetron [facility]) tablet 4 mg, 4 mg, Oral, Q6H PRN **OR** ondansetron (ZOFRAN) injection 4 mg, 4 mg, Intravenous, Q6H PRN, Mcarthur Rossetti, MD .  oxyCODONE (Oxy IR/ROXICODONE) immediate release tablet 10-15 mg, 10-15 mg, Oral, Q4H PRN, Mcarthur Rossetti, MD, 15 mg at 11/09/19 0617 .  oxyCODONE (Oxy IR/ROXICODONE) immediate release tablet 5-10 mg, 5-10 mg, Oral, Q4H PRN, Mcarthur Rossetti, MD, 10 mg at 11/08/19 1818 .  pantoprazole (PROTONIX) EC tablet 40 mg, 40 mg, Oral, Daily, Mcarthur Rossetti, MD, 40 mg at 11/10/19 0810 .  predniSONE (DELTASONE) tablet 5 mg, 5 mg, Oral, BID, Mcarthur Rossetti, MD, 5 mg at 11/10/19 0810 .  pregabalin (LYRICA) capsule 200 mg, 200 mg, Oral, BID, Mcarthur Rossetti, MD, 200 mg at 11/10/19 0809 .  sodium chloride flush (NS) 0.9 % injection 10-40 mL, 10-40 mL, Intracatheter, PRN, Mcarthur Rossetti, MD .  sorbitol 70 % solution 30 mL, 30 mL, Oral, Daily PRN, Mcarthur Rossetti, MD .  sulfamethoxazole-trimethoprim (BACTRIM DS) 800-160 MG per tablet 1 tablet, 1 tablet, Oral, Q12H, Mcarthur Rossetti, MD, 1 tablet at 11/10/19 0810 .  sulfaSALAzine (AZULFIDINE) tablet 500 mg, 500 mg, Oral, BID, Mcarthur Rossetti, MD, 500 mg at 11/10/19 0810 .  tamsulosin (FLOMAX) capsule 0.4 mg, 0.4 mg,  Oral, QPC supper,  Mcarthur Rossetti, MD, 0.4 mg at 11/09/19 1828 .  torsemide (DEMADEX) tablet 20 mg, 20 mg, Oral, Daily, Mcarthur Rossetti, MD, 20 mg at 11/10/19 0809 .  valACYclovir (VALTREX) tablet 500 mg, 500 mg, Oral, Daily, Mcarthur Rossetti, MD, 500 mg at 11/10/19 7654   Patients Current Diet:     Diet Order                      Diet regular Room service appropriate? Yes; Fluid consistency: Thin  Diet effective now                      Precautions / Restrictions Precautions Precautions: Fall, Posterior Hip (History of past hip dislocations) Precaution Comments: TDWB on LLE Restrictions Weight Bearing Restrictions: Yes LLE Weight Bearing: Touchdown weight bearing Other Position/Activity Restrictions: monitor ROM on L hip to within comfort    Has the patient had 2 or more falls or a fall with injury in the past year? Yes   Prior Activity Level Community (5-7x/wk): Pt. went out for meals every day   Prior Functional Level Self Care: Did the patient need help bathing, dressing, using the toilet or eating? Independent   Indoor Mobility: Did the patient need assistance with walking from room to room (with or without device)? Independent   Stairs: Did the patient need assistance with internal or external stairs (with or without device)? Independent   Functional Cognition: Did the patient need help planning regular tasks such as shopping or remembering to take medications? Independent   Home Assistive Devices / Equipment Home Assistive Devices/Equipment: Radio producer (specify quad or straight), Wheelchair, Eyeglasses Home Equipment: Walker - 2 wheels, Reinholds - single point   Prior Device Use: Indicate devices/aids used by the patient prior to current illness, exacerbation or injury? Manual wheelchair, Motorized wheelchair or scooter and Environmental consultant   Current Functional Level Cognition   Overall Cognitive Status: Within Functional Limits for tasks assessed Orientation Level: Oriented  X4    Extremity Assessment (includes Sensation/Coordination)   Upper Extremity Assessment: Overall WFL for tasks assessed  Lower Extremity Assessment: LLE deficits/detail LLE Deficits / Details: weakness in hip and intolerance for ROM LLE: Unable to fully assess due to pain, Unable to fully assess due to immobilization LLE Coordination: decreased fine motor, decreased gross motor     ADLs         Mobility   Overal bed mobility: Needs Assistance Bed Mobility: Supine to Sit Supine to sit: Min guard General bed mobility comments: Pt able to complete bed mobility min guard for safety and maintaining WB precautions     Transfers   Overall transfer level: Needs assistance Equipment used: Bilateral platform walker, 4-wheeled walker Transfers: Sit to/from Stand, Stand Pivot Transfers Sit to Stand: Min guard Stand pivot transfers: Min guard  Lateral/Scoot Transfers: Min assist, From elevated surface General transfer comment: Pt able to sit to stand and stand pivot transfer min guard, while maintaining precautions and [name] walker. Sit to stand x2.     Ambulation / Gait / Stairs / Wheelchair Mobility   Ambulation/Gait Ambulation/Gait assistance: Herbalist (Feet): 1 Feet Assistive device: 4-wheeled walker, Bilateral platform walker Gait Pattern/deviations: Step-to pattern, Decreased stride length, Trunk flexed General Gait Details: Pt able to stand pivot transfer to chair, maintaining hip and WB precautions.     Posture / Balance Balance Overall balance assessment: Needs assistance Sitting-balance support: Feet supported Sitting balance-[name]: Good  Standing balance support: Bilateral upper extremity supported Standing balance-[name]: Poor Standing balance comment: heavy UE support for balance     Special needs/care consideration Skin Surgical incision at L hip, abrasion on coccyx, Ecchymosis on BUE's  Designated visitor Sadrac Zeoli (daughter) and Linna Hoff and [place] on IV antibiotics for 6 weeks via PICC Ampicillin Q 4 hrs    Previous Home Environment  Living Arrangements: Alone Available Help at Discharge: Family, Available PRN/intermittently Type of Home: House Home Layout: One level Home Access: Stairs to enter Entrance Stairs-Rails: Right, Left Entrance Stairs-Number of Steps: 5 Bathroom Toilet: [place]: No Additional Comments: has been able to walk well until recent increase in L hip pain   Discharge Living Setting Plans for Discharge Living Setting: Patient's home Type of Home at Discharge: Mobile home Discharge Home Layout: One level Discharge Home Access: [place] entrance Discharge Bathroom Shower/Tub: Walk-in shower Discharge Bathroom Toilet: Handicapped height Discharge Bathroom Accessibility: Yes How Accessible: Accessible via wheelchair, Accessible via walker Does the patient have any problems obtaining your medications?: No   Social/Family/Support Systems Patient Roles: Other (Comment) Contact Information: [phone] Anticipated Caregiver: Norwood Quezada (daughter) lives next door and does not currently work. She confirmed that she will stay in patient's handicapped accessible home and can provide 24/7 support.  Anticipated Caregiver's Contact Information: [phone] Ability/Limitations of Caregiver: None  Caregiver Availability: 24/7 Discharge Plan Discussed with Primary Caregiver: No   Goals Patient/Family Goal for Rehab: PT/OT Min Assist Expected length of stay: 8-10 days  Program Orientation Provided & Reviewed with Pt/Caregiver Including Roles  & Responsibilities: Yes   Decrease burden of Care through IP rehab admission: n/a   Possible need for SNF placement upon discharge: Not anticipated   Patient Condition: I have reviewed medical [facility] from [facility] , spoken with CM, and patient. I met with patient at the bedside for inpatient rehabilitation assessment.   Patient will benefit from ongoing PT and OT, can actively participate in 3 hours of therapy a day 5 days of the week, and can make measurable gains during the admission.  Patient will also benefit from the coordinated team approach during an Inpatient Acute Rehabilitation admission.  The patient will receive intensive therapy as well as Rehabilitation physician, nursing, social worker, and care management interventions.  Due to safety, skin/wound care, disease management, medication administration, pain management and patient education the patient requires 24 hour a day rehabilitation nursing.  The patient is currently min-Mod A  with mobility and basic ADLs.  Discharge setting and therapy post discharge at home with home health is anticipated.  Patient has agreed to participate in the Acute Inpatient Rehabilitation Program and will admit today.   Preadmission Screen Completed By: Clemens Catholic SLP with updates by  Cleatrice Burke, 11/10/2019 10:52 AM ______________________________________________________________________   Discussed status with Dr. Ranell Patrick  on  11/10/2019  at  1055 and received approval for admission today.   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 Does the need for close, 24 hr/day Medical supervision in concert with the patient's rehab needs make it unreasonable for this patient to be served in a less intensive setting? Yes 2. Co-Morbidities requiring supervision/potential complications: CAD, Type 2 DM with diabetic polyneuropathy, HIV infection, rheumatoid arthritis, carotid artery occlusion, hyperlipidemia, ILD, failure of right total hip arthroplasty with dislocation of hip 3. Due  to bladder management, bowel management, safety, skin/wound care, disease management, medication administration, pain management and patient education, does the  patient require 24 hr/day rehab nursing? Yes 4. Does the patient require coordinated care of a physician, rehab nurse, PT, OT, and SLP to address physical and functional deficits in the context of the above medical diagnosis(es)? Yes Addressing deficits in the following areas: balance, endurance, locomotion, strength, transferring, bowel/bladder control, bathing, dressing, feeding, grooming, toileting, cognition and psychosocial support 5. Can the patient actively participate in an intensive therapy program of at least 3 hrs of therapy 5 days a week? Yes 6. The potential for patient to make measurable gains while on inpatient rehab is excellent 7. Anticipated functional outcomes upon discharge from inpatient rehab: modified independent PT, modified independent OT, modified independent SLP 8. Estimated rehab length of stay to reach the above functional goals is: 10-14 days 9. Anticipated discharge destination: Home 10. Overall Rehab/Functional Prognosis: excellent     MD Signature: Leeroy Cha, MD        Revision History                               Note Details  Author Izora Ribas, MD File Time 11/10/2019 11:00 AM  Author Type Physician Status Signed  Last Editor Izora Ribas, MD Service Physical Medicine and [place] # [id number] Admit Date 11/10/2019

## 2019-11-10 NOTE — Progress Notes (Signed)
Inpatient Rehabilitation Medication Review by a Pharmacist  A complete drug regimen review was completed for this patient to identify any potential clinically significant medication issues.  Clinically significant medication issues were identified:  no   Pharmacist comments:   Time spent performing this drug regimen review (minutes):  25   Bertis Ruddy 11/10/2019 1:56 PM

## 2019-11-10 NOTE — H&P (Signed)
Physical Medicine and Rehabilitation Admission H&P     HPI: [name] is a 69 year old right-handed male with history of chronic back pain/fibromyalgia, CLL currently on no therapy, CAD with CABG 2010, hypertension, HIV infection diagnosed 1993, TIA 1997, lateral knee surgery, patient underwent left total hip arthroplasty April of last year postoperatively he had instability and recurrent dislocations requiring several other surgeries through June of last year and in late October he fell sustaining a periprosthetic fracture around the femoral stem.  A CT scan in December showed some lucency around some of the acetabular screws.  .  Per chart review patient lives alone.  Independent with assistive device.  1 level home 5 steps to entry.  Presented 11/03/2019 after mechanical fall without loss of consciousness.  X-rays and imaging revealed progressive loosening of left hip acetabular component.  The acetabular component had become more medialized and more vertical after fall.  There were noted fracture lines in the pelvis on CT scan.  Patient underwent revision left hip acetabular component 11/03/2019 per Dr. Ninfa Linden.  Touchdown weightbearing left lower extremity.  Maintain on aspirin 81 mg twice daily for DVT prophylaxis.  Acute blood loss anemia 7.7.  Wound culture showed Enterococcus with infectious disease consulted Dr. Michel Bickers and placed on IV vancomycin initially and transitioned to intravenous ampicillin that he will continue on for 6 weeks before converting to oral amoxicillin.  Therapy evaluations completed and patient was admitted for a comprehensive rehab program.  Review of Systems  Constitutional: Positive for malaise/fatigue. Negative for chills and fever.  HENT: Negative for hearing loss.   Eyes: Negative for blurred vision and double vision.  Respiratory: Negative for cough and shortness of breath.   Cardiovascular: Positive for leg swelling. Negative for chest pain and  palpitations.  Gastrointestinal: Positive for constipation. Negative for heartburn, nausea and vomiting.       GERD  Genitourinary: Positive for urgency. Negative for dysuria and hematuria.  Musculoskeletal: Positive for back pain, falls, joint pain and myalgias.  Skin: Negative for rash.  Psychiatric/Behavioral: Positive for depression. The patient has insomnia.        Anxiety  All other systems reviewed and are negative.  Past Medical History:  Diagnosis Date  . Allergy   . Anxiety   . Carotid artery occlusion    40-60% right ICA stenosis (09/2008)  . Cataract   . Chronic back pain   . CLL (chronic lymphoblastic leukemia) dx 2010   [facility]ed [facility] no current therapy   . [place]otting disorder ([place]   . Coronary artery disease 2010   s/p CABG '10, sees Dr. HPercival Spanish . Depression   . Diverticulosis   . DVT, lower extremity, recurrent ([facility] 2008, 2009   LLE, chronic anticoag since 2009  . Esophagitis   . Fibromyalgia   . Gallstones   . GERD (gastroesophageal reflux disease)   . Gout   . Gynecomastia, male   . H/O hiatal hernia 2008   surgery  . Hemorrhoids   . Hepatitis A yrs ago  . HIV infection ([place] dx 1993  . Hypertension   . Impotence of organic origin   . Myocardial infarction ([place] 2010    x 2  . Neuromuscular disorder (HCC)    neuropathy  . Osteoarthritis, knee    s/p B TKA  . Osteoporosis   . PONV (postoperative nausea and vomiting)   . Pulmonary fibrosis ([facility] 2017  . Rheumatoid arthritis(714.0) dx 2010   MTX, follows with  rheum  . Seasonal allergies   . Secondary syphilis 07/24/14 dx   s/p 2 wks doxy  . Status post dilation of esophageal narrowing   . Stroke [facility]) 1969   TIA  . TIA (transient ischemic attack) 1997   mild residual L mouth droop  . Tubular adenoma of colon    Past Surgical History:  Procedure Laterality Date  . ACETABULAR REVISION Left 11/03/2019   REVISION LEFT HIP ACETABULAR COMPONENT (Left Hip)  . ANTERIOR HIP REVISION Left  08/17/2018   Procedure: ANTERIOR LEFT HIP REVISION;  Surgeon: Mcarthur Rossetti, MD;  Location: [place];  Service: Orthopedics;  Laterality: Left;  . ANTERIOR HIP REVISION Left 11/02/2018   Procedure: LEFT HIP CONSTRAINED LINER REVISION;  Surgeon: Mcarthur Rossetti, MD;  Location: [place];  Service: Orthopedics;  Laterality: Left;  . ANTERIOR HIP REVISION Left 11/03/2019   Procedure: REVISION LEFT HIP ACETABULAR COMPONENT;  Surgeon: Mcarthur Rossetti, MD;  Location: [place];  Service: Orthopedics;  Laterality: Left;  . BACK SURGERY  2010 & 2017  . CHOLECYSTECTOMY    . COLONOSCOPY WITH PROPOFOL N/A 12/28/2012   Procedure: COLONOSCOPY WITH PROPOFOL;  Surgeon: Jerene Bears, MD;  [facility];  Service: Gastroenterology;  Laterality: N/A;  . CORONARY ARTERY BYPASS GRAFT  2010   triple bypass  . ESOPHAGOGASTRODUODENOSCOPY (EGD) WITH PROPOFOL N/A 12/28/2012   Procedure: ESOPHAGOGASTRODUODENOSCOPY (EGD) WITH PROPOFOL;  Surgeon: Jerene Bears, MD;  [facility];  Service: Gastroenterology;  Laterality: N/A;  . ESOPHAGOGASTRODUODENOSCOPY (EGD) WITH PROPOFOL N/A 03/15/2013   Procedure: ESOPHAGOGASTRODUODENOSCOPY (EGD) WITH PROPOFOL;  Surgeon: Jerene Bears, MD;  [facility];  Service: Gastroenterology;  Laterality: N/A;  . ESOPHAGOGASTRODUODENOSCOPY (EGD) WITH PROPOFOL N/A 02/07/2016   Procedure: ESOPHAGOGASTRODUODENOSCOPY (EGD) WITH PROPOFOL;  Surgeon: Jerene Bears, MD;  [facility];  Service: Gastroenterology;  Laterality: N/A;  . HARDWARE REMOVAL N/A 07/02/2012   Procedure: HARDWARE REMOVAL;  Surgeon: Elaina Hoops, MD;  Location: [place] [facility];  Service: Neurosurgery;  Laterality: N/A;  . HIATAL HERNIA REPAIR     wrap  . HIP CLOSED REDUCTION Left 08/15/2018   Procedure: CLOSED REDUCTION HIP;  Surgeon: Newt Minion, MD;  Location: [place];  Service: Orthopedics;  Laterality: Left;  . HIP CLOSED REDUCTION Left 08/28/2018   Procedure: CLOSED REDUCTION HIP FOR  RECURRENT DISLOCATION AND DRESSING CHANGE;  Surgeon: Jessy Oto, MD;  Location: [place];  Service: Orthopedics;  Laterality: Left;  . HIP CLOSED REDUCTION Left 09/15/2018   Procedure: CLOSED REDUCTION HIP DISLOCATION;  Surgeon: Mcarthur Rossetti, MD;  Location: [place];  Service: Orthopedics;  Laterality: Left;  . HIP CLOSED REDUCTION Left 10/31/2018   Procedure: CLOSED REDUCTION LEFT TOTAL HIP;  Surgeon: Leandrew Koyanagi, MD;  Location: [place];  Service: Orthopedics;  Laterality: Left;  . INGUINAL HERNIA REPAIR Bilateral   . JOINT REPLACEMENT Left 1999  . KNEE ARTHROPLASTY  07/22/2011   Procedure: COMPUTER ASSISTED TOTAL KNEE ARTHROPLASTY;  Surgeon: Meredith Pel, MD;  Location: [place];  Service: Orthopedics;  Laterality: Right;  Right total knee arthroplasty  . MANDIBLE SURGERY Bilateral    tmj  . REPLACEMENT TOTAL KNEE Bilateral   . ring around testicle hernia reapir  184 and 1986   x 2  . ROTATOR CUFF REPAIR Right   . SHOULDER SURGERY Left   . SPINE SURGERY  2010   "rod and screws", "failed", lopwer spine,   . stent to heart x 1  2010  .  TONSILLECTOMY    . TOTAL HIP ARTHROPLASTY Left 08/13/2018   Procedure: LEFT TOTAL HIP ARTHROPLASTY ANTERIOR APPROACH;  Surgeon: Mcarthur Rossetti, MD;  Location: [place];  Service: Orthopedics;  Laterality: Left;  . TOTAL HIP ARTHROPLASTY Left 09/17/2018   Procedure: ANTERIOR LEFT HIP REVISION;  Surgeon: Mcarthur Rossetti, MD;  Location: [place];  Service: Orthopedics;  Laterality: Left;  . UMBILICAL HERNIA REPAIR     x 1  . varicose vein     stripping  . VIDEO BRONCHOSCOPY Bilateral 10/16/2015   Procedure: VIDEO BRONCHOSCOPY WITHOUT FLUORO;  Surgeon: Brand Males, MD;  [facility];  Service: Cardiopulmonary;  Laterality: Bilateral;   Family History  Problem Relation Age of Onset  . Breast cancer Mother   . Hypertension Mother   . Hyperlipidemia Mother   . Diabetes Mother   . Prostate cancer Father   . Colon polyps Father    . Hyperlipidemia Father   . Crohn's disease Paternal Aunt   . Diabetes Maternal Grandmother   . Diabetes Brother        x 3  . Heart attack Brother   . Heart disease Brother        x 3  . Heart attack Brother   . Hyperlipidemia Brother        x 3  . Alcohol abuse Daughter   . Drug abuse Daughter   . Asthma Brother   . CVA Brother   . Colon cancer Neg Hx   . Esophageal cancer Neg Hx   . Rectal cancer Neg Hx   . Stomach cancer Neg Hx    Social History:  reports that he has never smoked. He has never used smokeless tobacco. He reports previous alcohol use. He reports previous drug use. Drug: Marijuana. Allergies:  Allergies  Allergen Reactions  . Golimumab Anaphylaxis    Simponi ARIA  . Orencia [Abatacept] Anaphylaxis  . Other Anaphylaxis and Hives    Pecans  . Peanut-Containing Drug Products Anaphylaxis, Hives and Swelling    Swelling of throat  . Morphine Other (See Comments)    Severe headache- Can tolerate Dilaudid, however  . Oxycodone-Acetaminophen Other (See Comments)    Headache 6.22.2020 patient is currently taking and tolerating  . Promethazine Hcl Other (See Comments)    Makes him feel "drunk" at higher strengths   Medications Prior to Admission  Medication Sig Dispense Refill  . acetaminophen (TYLENOL) 500 MG tablet Take 500-1,000 mg by mouth every 6 (six) hours as needed for mild pain or moderate pain.     [name] ampicillin IVPB Inject 12 g into the vein daily. As a continuous infusion  Indication: hip infection First Dose: Yes Last Day of Therapy:  12/20/2019 Labs - Once weekly:  CBC/D and BMP, Labs - Every other week:  ESR and CRP Method of administration: Ambulatory Pump (Continuous Infusion) Method of administration may be changed at the discretion of home infusion pharmacist based upon assessment of the patient and/or caregiver's ability to self-administer the medication ordered. 41 Units 0  . atorvastatin (LIPITOR) 10 MG tablet TAKE 1 TABLET BY MOUTH AT  BEDTIME (Patient taking differently: Take 10 mg by mouth at bedtime. ) 30 tablet 9  . bictegravir-emtricitabine-tenofovir AF (BIKTARVY) 50-200-25 MG TABS tablet Take 1 tablet by mouth daily.     . clindamycin (CLEOCIN) 300 MG capsule Take two by mouth one hour before dental appointment, then two by mouth six hours after appointment (Patient taking differently: Take 600 mg by mouth See  admin instructions. Take two by mouth one hour before dental appointment, then two by mouth six hours after appointment) 12 capsule 0  . darunavir-cobicistat (PREZCOBIX) 800-150 MG tablet Take 1 tablet by mouth daily with breakfast.     . EPINEPHrine (EPIPEN 2-PAK) 0.3 mg/0.3 mL IJ SOAJ injection Inject 0.3 mg into the muscle as needed for anaphylaxis.    [name] escitalopram (LEXAPRO) 20 MG tablet Take 1 tablet (20 mg total) by mouth daily. 90 tablet 1  . ezetimibe (ZETIA) 10 MG tablet Take 1 tablet (10 mg total) by mouth daily. (Patient taking differently: Take 10 mg by mouth at bedtime. ) 90 tablet 3  . folic acid (FOLVITE) 1 MG tablet Take 1 mg by mouth daily.  11  . HYDROcodone-acetaminophen (NORCO) 10-325 MG tablet Take 1 tablet by mouth 2 (two) times daily as needed (pain.).    [name] l-methylfolate-B6-B12 (METANX) 3-35-2 MG TABS tablet Take 1 tablet by mouth 2 (two) times daily. 60 tablet 5  . leflunomide (ARAVA) 20 MG tablet Take 20 mg by mouth daily.     . metoprolol succinate (TOPROL-XL) 50 MG 24 hr tablet Take 1 tablet (50 mg total) by mouth daily. 90 tablet 3  . nitroGLYCERIN (NITROSTAT) 0.4 MG SL tablet Place 1 tablet (0.4 mg total) under the tongue every 5 (five) minutes as needed for chest pain. 25 tablet 2  . ondansetron (ZOFRAN) 4 MG tablet Take 4 mg by mouth every 6 (six) hours as needed for nausea.    . predniSONE (DELTASONE) 5 MG tablet Take 5 mg by mouth 2 (two) times daily.   1  . pregabalin (LYRICA) 200 MG capsule TAKE 1 CAPSULE BY MOUTH TWICE A DAY (Patient taking differently: Take 200 mg by mouth 2 (two)  times daily. ) 60 capsule 3  . SAVAYSA 30 MG TABS tablet TAKE 1 TABLET (30 MG TOTAL) BY MOUTH DAILY. (Patient taking differently: Take 30 mg by mouth daily. ) 30 tablet 5  . sulfamethoxazole-trimethoprim (BACTRIM DS) 800-160 MG tablet TAKE 1 TABLET BY MOUTH EVERY 12 HOURS (Patient taking differently: Take 1 tablet by mouth in the morning and at bedtime. ) 60 tablet 3  . sulfaSALAzine (AZULFIDINE) 500 MG tablet Take 500 mg by mouth in the morning and at bedtime.     . tamsulosin (FLOMAX) 0.4 MG CAPS capsule TAKE 1 CAPSULE (0.4 MG TOTAL) BY MOUTH DAILY AFTER SUPPER. 30 capsule 1  . torsemide (DEMADEX) 20 MG tablet TAKE 1 TABLET BY MOUTH EVERY DAY (Patient taking differently: Take 20 mg by mouth daily. ) 90 tablet 1  . valACYclovir (VALTREX) 500 MG tablet TAKE 1 TABLET BY MOUTH EVERY DAY (Patient taking differently: Take 500 mg by mouth daily. ) 30 tablet 4    Drug Regimen Review Drug regimen was reviewed and remains appropriate with no significant issues identified  Home: Home Living Family/patient expects to be discharged to:: Private residence Living Arrangements: Alone Available Help at Discharge: Family, Available PRN/intermittently Type of Home: House Home Access: Stairs to enter Secretary/administrator of Steps: 5 Entrance Stairs-Rails: Right, Left Home Layout: One level Bathroom Toilet: Standard Home Equipment: Environmental consultant - 2 wheels, Cane - single point Additional Comments: has been able to walk well until recent increase in L hip pain   Functional History: Prior Function Level of Independence: Independent with assistive device(s) Comments: mostly rollator used  Functional Status:  Mobility: Bed Mobility Overal bed mobility: Needs Assistance Bed Mobility: Supine to Sit Supine to sit: Min guard General bed  mobility comments: Pt able to complete bed mobility min guard for safety and maintaining WB precautions Transfers Overall transfer level: Needs assistance Equipment used:  Bilateral platform walker, 4-wheeled walker Transfers: Sit to/from Stand, Stand Pivot Transfers Sit to Stand: Min guard Stand pivot transfers: Min guard  Lateral/Scoot Transfers: Min assist, From elevated surface General transfer comment: Pt able to sit to stand and stand pivot transfer min guard, while maintaining precautions and [name] walker. Sit to stand x2. Ambulation/Gait Ambulation/Gait assistance: Min assist Gait Distance (Feet): 1 Feet Assistive device: 4-wheeled walker, Bilateral platform walker Gait Pattern/deviations: Step-to pattern, Decreased stride length, Trunk flexed General Gait Details: Pt able to stand pivot transfer to chair, maintaining hip and WB precautions.    ADL:    Cognition: Cognition Overall Cognitive Status: Within Functional Limits for tasks assessed Orientation Level: Oriented X4 Cognition Arousal/Alertness: Awake/alert Behavior During Therapy: WFL for tasks assessed/performed Overall Cognitive Status: Within Functional Limits for tasks assessed  Physical Exam: Blood pressure 93/63, pulse 79, temperature 98.4 F (36.9 C), temperature source Oral, resp. rate 18, height '5\' 9"'$  (1.753 m), weight 78.9 kg, SpO2 97 %. General: Alert and oriented x 3, No apparent distress HEENT: Head is normocephalic, atraumatic, PERRLA, EOMI, sclera anicteric, oral mucosa pink and moist, dentition intact, ext ear canals clear,  Neck: Supple without JVD or lymphadenopathy Heart: Reg rate and rhythm. No murmurs rubs or gallops Chest: CTA bilaterally without wheezes, rales, or rhonchi; no distress Abdomen: Soft, non-tender, non-distended, bowel sounds positive. Extremities: No clubbing, cyanosis, or edema. Pulses are 2+ Skin:    Comments: Left hip incision is dressed with some bloody drainage.  No odor noted.  Appropriately tender  Neurological:     Comments: Patient is alert in no acute distress and follows commands.  Oriented x3 and cooperative with exam.   No results  found. However, due to the size of the patient record, not all encounters were searched. Please check Results Review for a complete set of results. No results found.  Medical Problem List and Plan: 1.  Decreased functional mobility secondary to failed left hip acetabular component with history of left total hip arthroplasty April of last year complicated by multiple dislocations and recent fall  -patient may shower but incision must be covered.   -ELOS/Goals: modI in 10-14 days 2.  Antithrombotics: -DVT/anticoagulation: SCDs.  Check vascular study  -antiplatelet therapy: Aspirin 81 mg twice daily 3. Pain Management: Lyrica 200 mg twice daily, oxycodone and Robaxin as needed. Pain is currently well controlled.  4. Mood: Lexapro 20 mg daily  -antipsychotic agents: N/A 5. Neuropsych: This patient is capable of making decisions on his own behalf. 6. Skin/Wound Care: Daily inspection of left hip. Incision is currently with bloody drainage, to be expected as per ortho.  7. Fluids/Electrolytes/Nutrition: Routine in and outs with follow-up chemistries 8.  Acute blood loss anemia.  Follow-up CBC. 6/29 Hgb 7.7 9.  HIV diagnosed 61.  Follow-up infectious disease.  Continue continue antiretroviral medications 10.  ID/enterococcal prosthetic infection left hip.  Continue intravenous ampicillin 2 g every 4 hours x6 weeks then converting to oral amoxicillin.  Follow-up per infectious disease Dr. Michel Bickers 11.  Hypertension.  Toprol 50 mg daily, Demadex 20 mg daily.  Monitor with increased mobility  7/1: Currently hypotensive. HR well controlled. Decrease Torsemide to '10mg'$  daily.  12.  Hyperlipidemia.  Lipitor/Zetia 13.  BPH.  Flomax 0.4 mg daily.   Helyn Numbers, PA-C  I have personally performed a face to face diagnostic evaluation,  including, but not limited to relevant history and physical exam findings, of this patient and developed relevant assessment and plan.  Additionally, I have reviewed and  concur with the physician assistant's documentation above.  I have personally performed a face to face diagnostic evaluation, including, but not limited to relevant history and physical exam findings, of this patient and developed relevant assessment and plan.  Additionally, I have reviewed and concur with the physician assistant's documentation above.  Leeroy Cha, MD

## 2019-11-10 NOTE — Plan of Care (Signed)
  Problem: Consults Goal: RH GENERAL PATIENT EDUCATION Description: See Patient Education module for education specifics. Outcome: Progressing   Problem: RH SKIN INTEGRITY Goal: RH STG MAINTAIN SKIN INTEGRITY WITH ASSISTANCE Description: STG Maintain Skin Integrity With mod I Assistance. Outcome: Progressing   Problem: RH SAFETY Goal: RH STG ADHERE TO SAFETY PRECAUTIONS W/ASSISTANCE/DEVICE Description: STG Adhere to Safety Precautions With cues and reminders.  Outcome: Progressing Goal: RH STG DECREASED RISK OF FALL WITH ASSISTANCE Description: STG Decreased Risk of Fall With cues and reminders.  Outcome: Progressing   Problem: RH PAIN MANAGEMENT Goal: RH STG PAIN MANAGED AT OR BELOW PT'S PAIN GOAL Description: Pain level less than 4 on scale of 0-10 Outcome: Progressing   Problem: RH KNOWLEDGE DEFICIT GENERAL Goal: RH STG INCREASE KNOWLEDGE OF SELF CARE AFTER HOSPITALIZATION Description: Pt will be able to demonstrate understanding of proper precautions and weight bearing restrictions to take post hip replacement to prevent further dislocation and possible complications independently using handouts. Pt will be able to demonstrate understanding of pain management using pharmacologic and nonpharmacologic interventions independently.   Outcome: Progressing

## 2019-11-11 ENCOUNTER — Inpatient Hospital Stay (HOSPITAL_COMMUNITY): Payer: Medicare Other | Admitting: Occupational Therapy

## 2019-11-11 ENCOUNTER — Inpatient Hospital Stay (HOSPITAL_COMMUNITY): Payer: Medicare Other | Admitting: Physical Therapy

## 2019-11-11 ENCOUNTER — Telehealth: Payer: Self-pay | Admitting: Internal Medicine

## 2019-11-11 ENCOUNTER — Inpatient Hospital Stay (HOSPITAL_COMMUNITY): Payer: Medicare Other

## 2019-11-11 ENCOUNTER — Telehealth (HOSPITAL_COMMUNITY): Payer: Self-pay | Admitting: Occupational Therapy

## 2019-11-11 DIAGNOSIS — M7989 Other specified soft tissue disorders: Secondary | ICD-10-CM

## 2019-11-11 DIAGNOSIS — J9610 Chronic respiratory failure, unspecified whether with hypoxia or hypercapnia: Secondary | ICD-10-CM

## 2019-11-11 DIAGNOSIS — M009 Pyogenic arthritis, unspecified: Secondary | ICD-10-CM

## 2019-11-11 LAB — CBC WITH DIFFERENTIAL/PLATELET
Abs Immature Granulocytes: 0.03 10*3/uL (ref 0.00–0.07)
Basophils Absolute: 0 10*3/uL (ref 0.0–0.1)
Basophils Relative: 0 %
Eosinophils Absolute: 0 10*3/uL (ref 0.0–0.5)
Eosinophils Relative: 0 %
HCT: 25.2 % — ABNORMAL LOW (ref 39.0–52.0)
Hemoglobin: 7.8 g/dL — ABNORMAL LOW (ref 13.0–17.0)
Immature Granulocytes: 0 %
Lymphocytes Relative: 64 %
Lymphs Abs: 6.3 10*3/uL — ABNORMAL HIGH (ref 0.7–4.0)
MCH: 35.5 pg — ABNORMAL HIGH (ref 26.0–34.0)
MCHC: 31 g/dL (ref 30.0–36.0)
MCV: 114.5 fL — ABNORMAL HIGH (ref 80.0–100.0)
Monocytes Absolute: 1.4 10*3/uL — ABNORMAL HIGH (ref 0.1–1.0)
Monocytes Relative: 15 %
Neutro Abs: 2 10*3/uL (ref 1.7–7.7)
Neutrophils Relative %: 21 %
Platelets: 107 10*3/uL — ABNORMAL LOW (ref 150–400)
RBC: 2.2 MIL/uL — ABNORMAL LOW (ref 4.22–5.81)
RDW: 14.4 % (ref 11.5–15.5)
WBC: 9.8 10*3/uL (ref 4.0–10.5)
nRBC: 0 % (ref 0.0–0.2)

## 2019-11-11 LAB — COMPREHENSIVE METABOLIC PANEL
ALT: 17 U/L (ref 0–44)
AST: 17 U/L (ref 15–41)
Albumin: 2.1 g/dL — ABNORMAL LOW (ref 3.5–5.0)
Alkaline Phosphatase: 57 U/L (ref 38–126)
Anion gap: 7 (ref 5–15)
BUN: 6 mg/dL — ABNORMAL LOW (ref 8–23)
CO2: 29 mmol/L (ref 22–32)
Calcium: 7.9 mg/dL — ABNORMAL LOW (ref 8.9–10.3)
Chloride: 101 mmol/L (ref 98–111)
Creatinine, Ser: 0.71 mg/dL (ref 0.61–1.24)
GFR calc Af Amer: >60 mL/min (ref >60)
GFR calc non Af Amer: >60 mL/min (ref >60)
Glucose, Bld: 106 mg/dL — ABNORMAL HIGH (ref 70–99)
Potassium: 3.6 mmol/L (ref 3.5–5.1)
Sodium: 137 mmol/L (ref 135–145)
Total Bilirubin: 0.6 mg/dL (ref 0.3–1.2)
Total Protein: 4.5 g/dL — ABNORMAL LOW (ref 6.5–8.1)

## 2019-11-11 MED ORDER — CHLORHEXIDINE GLUCONATE CLOTH 2 % EX PADS
6.0000 | MEDICATED_PAD | Freq: Every day | CUTANEOUS | Status: DC
  Administered 2019-11-11 – 2019-11-16 (×5): 6 via TOPICAL

## 2019-11-11 NOTE — Progress Notes (Signed)
Inpatient [facility] Individual Statement of Services  Patient Name:  [name]  Date:  11/11/2019  Welcome to the [facility].  Our goal is to provide you with an individualized program based on your diagnosis and situation, designed to meet your specific needs.  With this comprehensive rehabilitation program, you will be expected to participate in at least 3 hours of rehabilitation therapies Monday-Friday, with modified therapy programming on the weekends.  Your rehabilitation program will include the following services:  Physical Therapy (PT), Occupational Therapy (OT), Speech Therapy (ST), 24 hour per day rehabilitation nursing, Therapeutic Recreaction (TR), Neuropsychology, Care Coordinator, Rehabilitation Medicine, Nutrition Services, Pharmacy Services and Other  Weekly team conferences will be held on Wednesdays to discuss your progress.  Your Inpatient Rehabilitation Care Coordinator will talk with you frequently to get your input and to update you on team discussions.  Team conferences with you and your family in attendance may also be held.  Expected length of stay: 8-10 Days  Overall anticipated outcome: Min A  Depending on your progress and recovery, your program may change. Your Inpatient Rehabilitation Care Coordinator will coordinate services and will keep you informed of any changes. Your Inpatient Rehabilitation Care Coordinator's name and contact numbers are listed  below.  The following services may also be recommended but are not provided by the [facility]:    [place] will be made to provide these services after discharge if needed.  Arrangements include referral to agencies that provide these services.  Your insurance has been verified to be:  Medicare Your primary doctor is:  Hochrein,James, MD  Pertinent information will be shared  with your doctor and your insurance company.  Inpatient Rehabilitation Care Coordinator:  Erlene Quan, [place] or [phone]  Information discussed with and copy given to patient by: Dyanne Iha, 11/11/2019, 10:02 AM

## 2019-11-11 NOTE — Progress Notes (Signed)
Lower extremity venous has been completed.   Preliminary results in CV Proc.   Abram Sander 11/11/2019 2:49 PM

## 2019-11-11 NOTE — Telephone Encounter (Addendum)
Overnight pulse ox study October 24, 2019 shows pulse ox less than or equal to 80% at 288.5 minutes  Also lowest pulse was 35 bpm.  Total time in bradycardia was 180 minutes.  34% of sleep time was spent in bradycardia  Plan -Start 2 L of nasal cannula at night -If he has a cardiologist he should report this to the cardiologist

## 2019-11-11 NOTE — Telephone Encounter (Signed)
Pt in hospital for hip surgery and will be d/c on 7/8 or 7/9th. Needs to call back to r/s Wheelchair eval once he gets back home. Pt understands to call back to r/s when he gets home. Will notify Normand Sloop at [facility] with new schedule plans as requested.

## 2019-11-11 NOTE — Progress Notes (Addendum)
[facility] PHYSICAL MEDICINE & REHABILITATION PROGRESS NOTE   Subjective/Complaints:  Right thigh pain well controlled. Has drainage from Left lateral thigh wound  ROS- neg CP SOB, N/V/D  Objective:   No results found. Recent Labs    11/11/19 0533  WBC 9.8  HGB 7.8*  HCT 25.2*  PLT 107*   Recent Labs    11/11/19 0533  NA 137  K 3.6  CL 101  CO2 29  GLUCOSE 106*  BUN 6*  CREATININE 0.71  CALCIUM 7.9*    Intake/Output Summary (Last 24 hours) at 11/11/2019 0646 Last data filed at 11/11/2019 0346 Gross per 24 hour  Intake 520 ml  Output 1250 ml  Net -730 ml     Physical Exam: Vital Signs Blood pressure 122/68, pulse 82, temperature 99.3 F (37.4 C), temperature source Oral, resp. rate 16, height 5\' 9"  (1.753 m), weight 78 kg, SpO2 91 %.   General: No acute distress Mood and affect are appropriate Heart: Regular rate and rhythm no rubs murmurs or extra sounds Lungs: Clear to auscultation, breathing unlabored, no rales or wheezes Abdomen: Positive bowel sounds, soft nontender to palpation, nondistended Extremities: No clubbing, cyanosis, or edema Skin: No evidence of breakdown, no evidence of rash Left thigh wound draining dark serosanguinous fluid, no erythema, no tenderness, could not express additional fluid    Neurologic: Cranial nerves II through XII intact, motor strength is 5/5 in bilateral deltoid, bicep, tricep, grip, RIght hip flexor, knee extensors, ankle dorsiflexor and plantar flexor LLE trace HF, 3/5 KE  Musculoskeletal:Reduce ROM Left hip   Assessment/Plan: 1. Functional deficits secondary to Left hip infection with revision of acetabular component which require 3+ hours per day of interdisciplinary therapy in a comprehensive inpatient rehab setting.  Physiatrist is providing close team supervision and 24 hour management of active medical problems listed below.  Physiatrist and rehab team continue to assess barriers to discharge/monitor  patient progress toward functional and medical goals  Care Tool:  Bathing              Bathing assist       Upper Body Dressing/Undressing Upper body dressing Upper body dressing/undressing activity did not occur (including orthotics): N/A      Upper body assist      Lower Body Dressing/Undressing Lower body dressing    Lower body dressing activity did not occur: N/A       Lower body assist       Toileting Toileting Toileting Activity did not occur (Clothing management and hygiene only):  (Urinal)  Toileting assist Assist for toileting: Independent with assistive device Assistive Device Comment: Urinal   Transfers Chair/bed transfer  Transfers assist  Chair/bed transfer activity did not occur: N/A        Locomotion Ambulation   Ambulation assist              Walk 10 feet activity   Assist           Walk 50 feet activity   Assist           Walk 150 feet activity   Assist           Walk 10 feet on uneven surface  activity   Assist           Wheelchair     Assist               Wheelchair 50 feet with 2 turns activity    Assist  Wheelchair 150 feet activity     Assist          Blood pressure 122/68, pulse 82, temperature 99.3 F (37.4 C), temperature source Oral, resp. rate 16, height 5\' 9"  (1.753 m), weight 78 kg, SpO2 91 %.  Medical Problem List and Plan: 1.  Decreased functional mobility secondary to failed left hip s/p revision of acetabular component 11/03/2019 with history of left total hip arthroplasty 09/12/84 complicated by multiple dislocations and recent fall             -patient may shower but incision must be covered.              -ELOS/Goals: modI in 10-14 days 2.  Antithrombotics: -DVT/anticoagulation: SCDs.  Check vascular study             -antiplatelet therapy: Aspirin 81 mg twice daily 3. Pain Management: Lyrica 200 mg twice daily, oxycodone and Robaxin as  needed. Pain is currently well controlled.  4. Mood: Lexapro 20 mg daily             -antipsychotic agents: N/A 5. Neuropsych: This patient is capable of making decisions on his own behalf. 6. Skin/Wound Care: Daily inspection of left hip. Incision is currently with bloody drainage, to be expected as per ortho.  7. Fluids/Electrolytes/Nutrition: Routine in and outs with follow-up chemistries poor recorded po fluid intake  8.  Acute blood loss anemia.  Follow-up CBC. 6/29 Hgb 7.7, 7.8 on 7/2 stable  9.  HIV diagnosed 10.  Follow-up infectious disease.  Continue continue antiretroviral medications 10.  ID/enterococcal prosthetic infection left hip.  Continue intravenous ampicillin 2 g every 4 hours x6 weeks then converting to oral amoxicillin.  Follow-up per infectious disease Dr. Michel Bickers 11.  Hypertension.  Toprol 50 mg daily, Demadex 20 mg daily.  Monitor with increased mobility              HR well controlled. Decrease Torsemide to 10mg  daily.  Vitals:   11/10/19 1948 11/11/19 0346  BP: 108/67 122/68  Pulse: 84 82  Resp: 18 16  Temp: 99.8 F (37.7 C) 99.3 F (37.4 C)  SpO2: 92% 91%   12.  Hyperlipidemia.  Lipitor/Zetia 13.  BPH.  Flomax 0.4 mg daily.    LOS: 1 days A FACE TO FACE EVALUATION WAS PERFORMED  Charlett Blake 11/11/2019, 6:46 AM

## 2019-11-11 NOTE — Progress Notes (Signed)
Inpatient Rehabilitation Care Coordinator Assessment and Plan  Patient Details  Name: [name] MRN: [id number] Date of Birth: April 28, 1951  Today's Date: 11/11/2019  Problem List:  Patient Active Problem List   Diagnosis Date Noted  . Left acetabular fracture ([place]) 11/10/2019  . Prosthetic joint infection of left hip ([place]) 11/08/2019  . Failed total hip arthroplasty, subsequent encounter 11/03/2019  . S/P revision of total hip 11/03/2019  . Aortic atherosclerosis ([place]) 10/20/2019  . Weak urinary stream 09/21/2019  . Community acquired pneumonia 09/12/2019  . Neuropathy due to HIV ([place]) 08/17/2019  . Benign prostatic hyperplasia 08/04/2019  . Chronic bronchitis, mucopurulent ([place]) 08/01/2019  . Drug-induced polyneuropathy ([place]) 06/14/2019  . Educated about COVID-19 virus infection 06/05/2019  . Personal history of TIA (transient ischemic attack) 04/13/2019  . HIV-1 associated autonomic neuropathy ([place]) 04/13/2019  . Ataxia 04/13/2019  . Impaired functional mobility, balance, gait, and endurance 04/13/2019  . Cerebellar ataxia in diseases classified elsewhere ([place]) 04/13/2019  . Recurrent major depressive disorder, in partial remission ([place]) 11/23/2018  . Atherosclerosis of coronary artery bypass graft(s), unspecified, with other forms of angina pectoris ([place]) 11/23/2018  . Murmur, cardiac 11/23/2018  . Iron deficiency anemia due to dietary causes 11/17/2018  . Bilateral leg edema 11/16/2018  . Failure of right total hip arthroplasty with dislocation of hip ([place])   . Dislocation of hip prosthesis ([place])   . Avascular necrosis of bone of hip, left ([place]) 08/09/2018  . Eczema 07/01/2018  . Candida rash of groin 07/01/2018  . Depression with anxiety 01/14/2018  . 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 Carotid artery disease ([place]) 11/13/2016  . Stenosis of carotid artery 11/13/2016  . Spinal stenosis of thoracolumbar region 07/02/2016  . ILD (interstitial lung disease)  ([place]) 09/13/2015  . Immunocompromised state ([place]) 09/13/2015  . Tinea cruris 09/12/2015  . PCP (pneumocystis jiroveci pneumonia) ([place]) 06/18/2015  . Postinflammatory pulmonary fibrosis ([place]) 05/11/2015  . GERD (gastroesophageal reflux disease)   . Hereditary and idiopathic peripheral neuropathy 09/08/2013  . Erosive esophagitis 12/28/2012  . Allergic rhinitis, cause unspecified 04/28/2012  . Long term current use of anticoagulant therapy 02/03/2012  . Hypertension   . DVT, lower extremity, recurrent ([place])   . Gout   . Carotid artery occlusion   . Hyperlipidemia with target LDL less than 100   . HIV infection ([place]) 04/08/2011  . Arthritis, rheumatoid ([place]) 04/08/2011  . CLL (chronic lymphoid leukemia) in relapse ([place]) 04/08/2011  . Type 2 diabetes mellitus with diabetic polyneuropathy, without long-term current use of insulin ([place]) 04/02/2011  . Impotence of organic origin 04/02/2011  . CAD (coronary artery disease) of artery bypass graft 02/14/2009  . Herpes genitalis 06/22/2008   Past Medical History:  Past Medical History:  Diagnosis Date  . Allergy   . Anxiety   . Carotid artery occlusion    40-60% right ICA stenosis (09/2008)  . Cataract   . Chronic back pain   . CLL (chronic lymphoblastic leukemia) dx 2010   Followed at [facility]74[facility], no current therapy   . Clotting disorder ([place])   . Coronary artery disease 2010   s/p CABG '10, sees Dr. Percival Spanish  . Depression   . Diverticulosis   . DVT, lower extremity, recurrent ([place]) 2008, 2009   LLE, chronic anticoag since 2009  . Esophagitis   . Fibromyalgia   . Gallstones   . GERD (gastroesophageal reflux disease)   . Gout   . Gynecomastia, male   . H/O hiatal hernia 2008  surgery  . Hemorrhoids   . Hepatitis A yrs ago  . HIV infection ([place]) dx 1993  . Hypertension   . Impotence of organic origin   . Myocardial infarction ([place]) 2010    x 2  . Neuromuscular disorder ([facility])    neuropathy  . Osteoarthritis, knee    s/p B  TKA  . Osteoporosis   . PONV (postoperative nausea and vomiting)   . Pulmonary fibrosis ([place]) 2017  . Rheumatoid arthritis(714.0) dx 2010   MTX, follows with rheum  . Seasonal allergies   . Secondary syphilis 07/24/14 dx   s/p 2 wks doxy  . Status post dilation of esophageal narrowing   . Stroke [facility]) 1969   TIA  . TIA (transient ischemic attack) 1997   mild residual L mouth droop  . Tubular adenoma of colon    Past Surgical History:  Past Surgical History:  Procedure Laterality Date  . ACETABULAR REVISION Left 11/03/2019   REVISION LEFT HIP ACETABULAR COMPONENT (Left Hip)  . ANTERIOR HIP REVISION Left 08/17/2018   Procedure: ANTERIOR LEFT HIP REVISION;  Surgeon: Mcarthur Rossetti, MD;  Location: [facility];  Service: Orthopedics;  Laterality: Left;  . ANTERIOR HIP REVISION Left 11/02/2018   Procedure: LEFT HIP CONSTRAINED LINER REVISION;  Surgeon: Mcarthur Rossetti, MD;  Location: [facility];  Service: Orthopedics;  Laterality: Left;  . ANTERIOR HIP REVISION Left 11/03/2019   Procedure: REVISION LEFT HIP ACETABULAR COMPONENT;  Surgeon: Mcarthur Rossetti, MD;  Location: [facility];  Service: Orthopedics;  Laterality: Left;  . BACK SURGERY  2010 & 2017  . CHOLECYSTECTOMY    . COLONOSCOPY WITH PROPOFOL N/A 12/28/2012   Procedure: COLONOSCOPY WITH PROPOFOL;  Surgeon: Jerene Bears, MD;  Location: [facility];  Service: Gastroenterology;  Laterality: N/A;  . CORONARY ARTERY BYPASS GRAFT  2010   triple bypass  . ESOPHAGOGASTRODUODENOSCOPY (EGD) WITH PROPOFOL N/A 12/28/2012   Procedure: ESOPHAGOGASTRODUODENOSCOPY (EGD) WITH PROPOFOL;  Surgeon: Jerene Bears, MD;  [facility];  Service: Gastroenterology;  Laterality: N/A;  . ESOPHAGOGASTRODUODENOSCOPY (EGD) WITH PROPOFOL N/A 03/15/2013   Procedure: ESOPHAGOGASTRODUODENOSCOPY (EGD) WITH PROPOFOL;  Surgeon: Jerene Bears, MD;  [facility];  Service: Gastroenterology;  Laterality: N/A;  . ESOPHAGOGASTRODUODENOSCOPY (EGD)  WITH PROPOFOL N/A 02/07/2016   Procedure: ESOPHAGOGASTRODUODENOSCOPY (EGD) WITH PROPOFOL;  Surgeon: Jerene Bears, MD;  [facility];  Service: Gastroenterology;  Laterality: N/A;  . HARDWARE REMOVAL N/A 07/02/2012   Procedure: HARDWARE REMOVAL;  Surgeon: Elaina Hoops, MD;  [facility];  Service: Neurosurgery;  Laterality: N/A;  . HIATAL HERNIA REPAIR     wrap  . HIP CLOSED REDUCTION Left 08/15/2018   Procedure: CLOSED REDUCTION HIP;  Surgeon: Newt Minion, MD;  Location: [facility];  Service: Orthopedics;  Laterality: Left;  . HIP CLOSED REDUCTION Left 08/28/2018   Procedure: CLOSED REDUCTION HIP FOR RECURRENT DISLOCATION AND DRESSING CHANGE;  Surgeon: Jessy Oto, MD;  Location: [facility];  Service: Orthopedics;  Laterality: Left;  . HIP CLOSED REDUCTION Left 09/15/2018   Procedure: CLOSED REDUCTION HIP DISLOCATION;  Surgeon: Mcarthur Rossetti, MD;  Location: [facility];  Service: Orthopedics;  Laterality: Left;  . HIP CLOSED REDUCTION Left 10/31/2018   Procedure: CLOSED REDUCTION LEFT TOTAL HIP;  Surgeon: Leandrew Koyanagi, MD;  Location: [facility];  Service: Orthopedics;  Laterality: Left;  . INGUINAL HERNIA REPAIR Bilateral   . JOINT REPLACEMENT Left 1999  . KNEE ARTHROPLASTY  07/22/2011   Procedure: COMPUTER ASSISTED  TOTAL KNEE ARTHROPLASTY;  Surgeon: Meredith Pel, MD;  Location: [facility];  Service: Orthopedics;  Laterality: Right;  Right total knee arthroplasty  . MANDIBLE SURGERY Bilateral    tmj  . REPLACEMENT TOTAL KNEE Bilateral   . ring around testicle hernia reapir  184 and 1986   x 2  . ROTATOR CUFF REPAIR Right   . SHOULDER SURGERY Left   . SPINE SURGERY  2010   "rod and screws", "failed", lopwer spine,   . stent to heart x 1  2010  . TONSILLECTOMY    . TOTAL HIP ARTHROPLASTY Left 08/13/2018   Procedure: LEFT TOTAL HIP ARTHROPLASTY ANTERIOR APPROACH;  Surgeon: Mcarthur Rossetti, MD;  Location: [facility];  Service: Orthopedics;  Laterality: Left;  . TOTAL HIP  ARTHROPLASTY Left 09/17/2018   Procedure: ANTERIOR LEFT HIP REVISION;  Surgeon: Mcarthur Rossetti, MD;  Location: [facility];  Service: Orthopedics;  Laterality: Left;  . UMBILICAL HERNIA REPAIR     x 1  . varicose vein     stripping  . VIDEO BRONCHOSCOPY Bilateral 10/16/2015   Procedure: VIDEO BRONCHOSCOPY WITHOUT FLUORO;  Surgeon: Brand Males, MD;  [facility];  Service: Cardiopulmonary;  Laterality: Bilateral;   Social History:  reports that he has never smoked. He has never used smokeless tobacco. He reports previous alcohol use. He reports previous drug use. Drug: Marijuana.  Family / Support Systems Marital Status: Widow/Widower Children: 2 Daughters and son Anticipated Caregiver: Daughter- [name] Ability/Limitations of Caregiver: Not allow to lift or bend over much. Caregiver Availability: 24/7  Social History Preferred language: English Religion: None Read: Yes Write: Yes   Abuse/Neglect Abuse/Neglect Assessment Can Be Completed: Yes Physical Abuse: Denies Verbal Abuse: Denies Sexual Abuse: Denies Exploitation of patient/patient's resources: Denies Self-Neglect: Denies  Emotional Status Pt's affect, behavior and adjustment status: Patient feels like mood has improved Recent Psychosocial Issues: no Psychiatric History: yes- over 10 years ago Substance Abuse History: no  Patient / Family Perceptions, Expectations & Goals Pt/Family understanding of illness & functional limitations: yes Pt/family expectations/goals: Goal to discharge back home  [employer]: None Premorbid Home Care/DME Agencies: None Transportation available at discharge: Daughter able to transport  Discharge Planning Living Arrangements: Children Support Systems: Children Type of Residence: Private residence (1 level home, 5 steps to enter, ramp enterance set up being done) Insurance Resources: Medicare Does the patient have any problems obtaining your  medications?: No Care Coordinator Anticipated Follow Up Needs: HH/OP Expected length of stay: 8-10 Days  Clinical Impression SW entered room, daughter at bedside. SW introduced self, explain role and process. Addressed question and concerns, sw will follow up.   Dyanne Iha 11/11/2019, 12:57 PM

## 2019-11-11 NOTE — Telephone Encounter (Signed)
Spoke with the pt and notified of results/recs  He verbalized understanding  Order sent to DME

## 2019-11-11 NOTE — Evaluation (Signed)
Physical Therapy Assessment and Plan  Patient Details  Name: [name] MRN: [id number] Date of Birth: 1950/12/10  PT Diagnosis: Abnormal posture, Impaired sensation, Muscle weakness, Pain in joint and Pain in L LE Rehab Potential: Excellent ELOS: ~5-7 days   Today's Date: 11/11/2019 PT Individual Time: 4627-0350 PT Individual Time Calculation (min): 64 min    Hospital Problem: Active Problems:   Left acetabular fracture [facility])   Past Medical History:  Past Medical History:  Diagnosis Date  . Allergy   . Anxiety   . Carotid artery occlusion    40-60% right ICA stenosis (09/2008)  . Cataract   . Chronic back pain   . CLL (chronic lymphoblastic leukemia) dx 2010   Followed at [facility] q63mo no current therapy   . Clotting disorder ([facility]   . Coronary artery disease 2010   s/p CABG '10, sees Dr. HPercival Spanish . Depression   . Diverticulosis   . DVT, lower extremity, recurrent ([place] 2008, 2009   LLE, chronic anticoag since 2009  . Esophagitis   . Fibromyalgia   . Gallstones   . GERD (gastroesophageal reflux disease)   . Gout   . Gynecomastia, male   . H/O hiatal hernia 2008   surgery  . Hemorrhoids   . Hepatitis A yrs ago  . HIV infection ([facility] dx 1993  . Hypertension   . Impotence of organic origin   . Myocardial infarction ([facility] 2010    x 2  . Neuromuscular disorder (HCC)    neuropathy  . Osteoarthritis, knee    s/p B TKA  . Osteoporosis   . PONV (postoperative nausea and vomiting)   . Pulmonary fibrosis ([facility] 2017  . Rheumatoid arthritis(714.0) dx 2010   MTX, follows with rheum  . Seasonal allergies   . Secondary syphilis 07/24/14 dx   s/p 2 wks doxy  . Status post dilation of esophageal narrowing   . Stroke ([facility] 1969   TIA  . TIA (transient ischemic attack) 1997   mild residual L mouth droop  . Tubular adenoma of colon    Past Surgical History:  Past Surgical History:  Procedure Laterality Date  . ACETABULAR REVISION Left 11/03/2019   REVISION LEFT HIP  ACETABULAR COMPONENT (Left Hip)  . ANTERIOR HIP REVISION Left 08/17/2018   Procedure: ANTERIOR LEFT HIP REVISION;  Surgeon: BMcarthur Rossetti MD;  [place]  Service: Orthopedics;  Laterality: Left;  . ANTERIOR HIP REVISION Left 11/02/2018   Procedure: LEFT HIP CONSTRAINED LINER REVISION;  Surgeon: BMcarthur Rossetti MD;  [place]  Service: Orthopedics;  Laterality: Left;  . ANTERIOR HIP REVISION Left 11/03/2019   Procedure: REVISION LEFT HIP ACETABULAR COMPONENT;  Surgeon: BMcarthur Rossetti MD;  [place]  Service: Orthopedics;  Laterality: Left;  . BACK SURGERY  2010 & 2017  . CHOLECYSTECTOMY    . COLONOSCOPY WITH PROPOFOL N/A 12/28/2012   Procedure: COLONOSCOPY WITH PROPOFOL;  Surgeon: JJerene Bears MD;  [facility];  Service: Gastroenterology;  Laterality: N/A;  . CORONARY ARTERY BYPASS GRAFT  2010   triple bypass  . ESOPHAGOGASTRODUODENOSCOPY (EGD) WITH PROPOFOL N/A 12/28/2012   Procedure: ESOPHAGOGASTRODUODENOSCOPY (EGD) WITH PROPOFOL;  Surgeon: JJerene Bears MD;  [facility];  Service: Gastroenterology;  Laterality: N/A;  . ESOPHAGOGASTRODUODENOSCOPY (EGD) WITH PROPOFOL N/A 03/15/2013   Procedure: ESOPHAGOGASTRODUODENOSCOPY (EGD) WITH PROPOFOL;  Surgeon: JJerene Bears MD;  [facility];  Service: Gastroenterology;  Laterality: N/A;  . ESOPHAGOGASTRODUODENOSCOPY (EGD) WITH PROPOFOL N/A  02/07/2016   Procedure: ESOPHAGOGASTRODUODENOSCOPY (EGD) WITH PROPOFOL;  Surgeon: Jerene Bears, MD;  [facility];  Service: Gastroenterology;  Laterality: N/A;  . HARDWARE REMOVAL N/A 07/02/2012   Procedure: HARDWARE REMOVAL;  Surgeon: Elaina Hoops, MD;  Location: [place] [facility];  Service: Neurosurgery;  Laterality: N/A;  . HIATAL HERNIA REPAIR     wrap  . HIP CLOSED REDUCTION Left 08/15/2018   Procedure: CLOSED REDUCTION HIP;  Surgeon: Newt Minion, MD;  Location: [place];  Service: Orthopedics;  Laterality: Left;  . HIP CLOSED  REDUCTION Left 08/28/2018   Procedure: CLOSED REDUCTION HIP FOR RECURRENT DISLOCATION AND DRESSING CHANGE;  Surgeon: Jessy Oto, MD;  Location: [place];  Service: Orthopedics;  Laterality: Left;  . HIP CLOSED REDUCTION Left 09/15/2018   Procedure: CLOSED REDUCTION HIP DISLOCATION;  Surgeon: Mcarthur Rossetti, MD;  Location: [place];  Service: Orthopedics;  Laterality: Left;  . HIP CLOSED REDUCTION Left 10/31/2018   Procedure: CLOSED REDUCTION LEFT TOTAL HIP;  Surgeon: Leandrew Koyanagi, MD;  Location: [place];  Service: Orthopedics;  Laterality: Left;  . INGUINAL HERNIA REPAIR Bilateral   . JOINT REPLACEMENT Left 1999  . KNEE ARTHROPLASTY  07/22/2011   Procedure: COMPUTER ASSISTED TOTAL KNEE ARTHROPLASTY;  Surgeon: Meredith Pel, MD;  Location: [place];  Service: Orthopedics;  Laterality: Right;  Right total knee arthroplasty  . MANDIBLE SURGERY Bilateral    tmj  . REPLACEMENT TOTAL KNEE Bilateral   . ring around testicle hernia reapir  184 and 1986   x 2  . ROTATOR CUFF REPAIR Right   . SHOULDER SURGERY Left   . SPINE SURGERY  2010   "rod and screws", "failed", lopwer spine,   . stent to heart x 1  2010  . TONSILLECTOMY    . TOTAL HIP ARTHROPLASTY Left 08/13/2018   Procedure: LEFT TOTAL HIP ARTHROPLASTY ANTERIOR APPROACH;  Surgeon: Mcarthur Rossetti, MD;  Location: [place];  Service: Orthopedics;  Laterality: Left;  . TOTAL HIP ARTHROPLASTY Left 09/17/2018   Procedure: ANTERIOR LEFT HIP REVISION;  Surgeon: Mcarthur Rossetti, MD;  Location: [place];  Service: Orthopedics;  Laterality: Left;  . UMBILICAL HERNIA REPAIR     x 1  . varicose vein     stripping  . VIDEO BRONCHOSCOPY Bilateral 10/16/2015   Procedure: VIDEO BRONCHOSCOPY WITHOUT FLUORO;  Surgeon: Brand Males, MD;  [facility];  Service: Cardiopulmonary;  Laterality: Bilateral;    Assessment & Plan Clinical Impression: Patient is a 69 y.o. year old right-handed male with history of chronic back  pain/fibromyalgia, CLL currently on no therapy, CAD with CABG 2010, hypertension, HIV infection diagnosed 1993, TIA 1997, lateral knee surgery, patient underwent left total hip arthroplasty April of last year postoperatively he had instability and recurrent dislocations requiring several other surgeries through June of last year and in late October he fell sustaining a periprosthetic fracture around the femoral stem.  A CT scan in December showed some lucency around some of the acetabular screws.  .  Per chart review patient lives alone.  Independent with assistive device.  1 level home 5 steps to entry.  Presented 11/03/2019 after mechanical fall without loss of consciousness.  X-rays and imaging revealed progressive loosening of left hip acetabular component.  The acetabular component had become more medialized and more vertical after fall.  There were noted fracture lines in the pelvis on CT scan.  Patient underwent revision left hip acetabular component 11/03/2019 per Dr. Ninfa Linden.  Touchdown  weightbearing left lower extremity.  Maintain on aspirin 81 mg twice daily for DVT prophylaxis.  Acute blood loss anemia 7.7.  Wound culture showed Enterococcus with infectious disease consulted Dr. Michel Bickers and placed on IV vancomycin initially and transitioned to intravenous ampicillin that he will continue on for 6 weeks before converting to oral amoxicillin.  Therapy evaluations completed and patient was admitted for a comprehensive rehab program. Patient transferred to CIR on 11/10/2019 .   Patient currently requires min with mobility secondary to muscle weakness, decreased cardiorespiratoy endurance and decreased sitting balance, decreased standing balance, decreased postural control and decreased balance strategies.  Prior to hospitalization, patient was modified independent  with mobility and lived with Alone in a Mobile home.  Home access is  Ramped entrance.  Patient will benefit from skilled PT intervention  to maximize safe functional mobility, minimize fall risk and decrease caregiver burden for planned discharge home with 24 hour supervision.  Anticipate patient will benefit from follow up [place] at discharge.  PT - End of Session Activity Tolerance: Tolerates 30+ min activity with multiple rests Endurance Deficit: Yes PT Assessment Rehab Potential (ACUTE/IP ONLY): Excellent PT Barriers to Discharge: Lack of/limited family support;Weight bearing restrictions;IV antibiotics PT Patient demonstrates impairments in the following area(s): Balance;Perception;Safety;Edema;Sensory;Endurance;Motor;Skin Integrity;Nutrition;Pain PT Transfers Functional Problem(s): Bed Mobility;Bed to Chair;Furniture;Car PT Locomotion Functional Problem(s): Wheelchair Mobility PT Plan PT Intensity: Minimum of 1-2 x/day ,45 to 90 minutes PT Frequency: 5 out of 7 days PT Duration Estimated Length of Stay: ~5-7 days PT Treatment/Interventions: Financial risk analyst;Neuromuscular re-education;Psychosocial support;Wheelchair propulsion/positioning;Balance/vestibular training;Discharge planning;Pain management;Skin care/wound management;Therapeutic Activities;UE/LE Coordination activities;Disease management/prevention;Functional mobility training;Splinting/orthotics;Patient/family education;Therapeutic Exercise;UE/LE Strength taining/ROM PT Transfers Anticipated Outcome(s): supervision stand pivot using walker PT Locomotion Anticipated Outcome(s): unable to ambulate due to [facility] precautions PT Recommendation Follow Up Recommendations: Home health PT;24 hour supervision/assistance Patient destination: Home Equipment Recommended: To be determined  Skilled Therapeutic Intervention Evaluation completed (see details above and below) with education on PT POC and goals and individual treatment initiated with focus on bed mobility, functional transfers, discharge planning, and pt education regarding  daily therapy schedule, weekly team meetings, purpose of PT evaluation, and other CIR information. Pt received supine in bed and agreeable to therapy session. Pt able to recall L LE TDWB and hip ROM precautions without cuing. Based on Orthopedic MD note on 6/25 recommendation is to limit pt's mobility only LLE TDWBing for transfers therefore not performing ambulation at this time.  Therapist retrieved [name] walker to simulate the UP walker he uses at home. Supine>sitting R EOB with supervision, not using bed features, pt uses UEs to assist with L LE management due to hip muscle weakness. Stand pivot transfer EOB>w/c using [name] walker with min assist for lifting, balance, and AD management - pt's UP walker has breaks so he can control it better (pt's daughter planning to bring that to pt this afternoon). Transported to/from gym in w/c for time management and energy conservation. Pt unable to participate in B UE manual w/c propulsion due to arthritis in B UEs and pt using motorized scooter/wheelchair baseline when his pain is too significant to walk or when he had prior WBing restrictions. Significant discussion regarding pt's DME at home (that is listed below) and therapist recommending the zero-turn scooter in house performing lateral scoot transfers because the seat turns and the arm rests flip back also since pt will not be able to safely tote his UP walker while riding the scooter. Simulated stand pivot transfer w/c<>car (small SUV height)  using [name] walker with CGA/min assist for balance and AD management - cuing to scoot back further in car prior to placing L LE in to maintain hip precautions as simulated car has shallow floor board. Transported back to room. L lateral scoot transfer w/c>EOB with CGA for steadying. Sit>supine with supervision and pt using R LE to assist with L LE management onto the bed. Pt left supine in bed with needs in reach, lines intact, and bed alarm on.  PT  Evaluation Precautions/Restrictions Precautions Precautions: Fall;Posterior Hip Precaution Comments: TDWB on LLE for tranfsers only Restrictions Weight Bearing Restrictions: Yes LLE Weight Bearing: Touchdown weight bearing Pain Pain Assessment Pain Scale: 0-10 Pain Score: 2  Pain Type: Acute pain;Surgical pain Pain Location: Hip Pain Orientation: Left Pain Descriptors / Indicators: Discomfort Pain Frequency: Constant Pain Onset: On-going Patients Stated Pain Goal: 0 Pain Intervention(s): Medication (See eMAR);Rest;Repositioned;Relaxation  Pt reports "I'm spinning right now" reporting he feels "aloof" due to the pain medication. Home Living/Prior Functioning Home Living Living Arrangements: Children Available Help at Discharge: Family;Available 24 hours/day (will have 24hr support initially then PRN) Type of Home: House Home Access: Ramped entrance Home Layout: One level  Lives With: Alone Prior Function Level of Independence: Independent with gait;Independent with homemaking with ambulation;Independent with transfers;Requires assistive device for independence (used cane or UP walker)  Able to Take Stairs?: No Driving: Yes Comments: has been able to walk well until recent increase in L hip pain; used UP walker or cane in the house - was able to ambulate up/down ramp to/from truck using cane; has 2 electric scooters (a zero turn for the house and a larger one for outside) and 1 power wheelchair (shaped like standard w/c) but footplate is not removeable Perception  Perception Perception: Within Functional Limits Praxis Praxis: Intact  Cognition Overall Cognitive Status: Within Functional Limits for tasks assessed Arousal/Alertness: Awake/alert Orientation Level: Oriented X4 Attention: Focused;Sustained;Selective Focused Attention: Appears intact Sustained Attention: Appears intact Selective Attention: Appears intact Memory: Appears intact Awareness: Appears  intact Safety/Judgment: Appears intact Sensation Sensation Light Touch: Impaired Detail Peripheral sensation comments: bilateral peripheral neuropathy Light Touch Impaired Details: Impaired RLE;Impaired LLE Hot/Cold: Not tested Proprioception: Impaired Detail Proprioception Impaired Details: Impaired RLE;Impaired LLE Stereognosis: Not tested Coordination Gross Motor Movements are Fluid and Coordinated: No Coordination and Movement Description: gross motor movements are impaired due to L LE hip pain and impaired strength as well as WBing precautions Motor  Motor Motor: Other (comment) Motor - Skilled Clinical Observations: generalized weakness and deconditioning (significant weakness in L hip musculature)  Mobility Bed Mobility Bed Mobility: Supine to Sit;Sit to Supine Supine to Sit: Supervision/Verbal cueing (uses UEs to assist with L LE management) Sit to Supine: Supervision/Verbal cueing (uses R LE to assist L LE onto bed) Transfers Transfers: Sit to Stand;Stand to [employer];Lateral/Scoot Transfers Sit to Stand: Contact Guard/Touching assist Stand to Sit: Contact Guard/Touching assist Stand Pivot Transfers: Contact Guard/Touching assist;Minimal Assistance - Patient > 75% Stand Pivot Transfer Details: Verbal cues for safe use of DME/AE;Verbal cues for technique;Verbal cues for precautions/safety Lateral/Scoot Transfers: Contact Guard/Touching assist Transfer (Assistive device): Theatre stage manager Ambulation: No Gait Gait: No Stairs / Additional Locomotion Stairs: No Wheelchair Mobility Wheelchair Mobility: No (due to bilateral UE arthritis pt has to use powered scooters/wheelchair)  Trunk/Postural Assessment  Cervical Assessment Cervical Assessment: Exceptions to [facility] (forward head) Thoracic Assessment Thoracic Assessment: Exceptions to [facility] (thoracic rounding with shoulder protraction) Lumbar Assessment Lumbar Assessment: Exceptions to [facility]  (posterior pelvic tilt  in sitting) Postural Control Postural Control: Within Functional Limits  Balance Balance Balance Assessed: Yes Static Sitting Balance Static Sitting - Level of Assistance: 5: Stand by assistance Dynamic Sitting Balance Dynamic Sitting - Balance Support: During functional activity Dynamic Sitting - Level of Assistance: 5: Stand by assistance Static Standing Balance Static Standing - Balance Support: During functional activity;Bilateral upper extremity supported Static Standing - Level of Assistance: Other (comment) (CGA) Dynamic Standing Balance Dynamic Standing - Balance Support: During functional activity;Bilateral upper extremity supported Dynamic Standing - Level of Assistance: 4: Min assist Extremity Assessment      RLE Assessment RLE Assessment: Exceptions to [facility] RLE Strength Right Hip Flexion: 3+/5 Right Knee Flexion: 4+/5 Right Knee Extension: 4+/5 Right Ankle Dorsiflexion: 3+/5 Right Ankle Plantar Flexion: 4-/5 LLE Assessment LLE Assessment: Exceptions to [facility] General Strength Comments: significantly impaired L hip strength in all directions noted functionally as pt requires use of BUEs to assist with that LE management LLE Strength Left Hip Flexion: 2-/5 Left Knee Flexion: 4-/5 Left Knee Extension: 4-/5 Left Ankle Dorsiflexion: 3+/5 Left Ankle Plantar Flexion: 3+/5    Refer to Care Plan for Long Term Goals  Recommendations for other services: None   Discharge Criteria: Patient will be discharged from PT if patient refuses treatment 3 consecutive times without medical reason, if treatment goals not met, if there is a change in medical status, if patient makes no progress towards goals or if patient is discharged from hospital.  The above assessment, treatment plan, treatment alternatives and goals were discussed and mutually agreed upon: by patient  Tawana Scale , PT, DPT, CSRS 11/11/2019, 7:56 AM

## 2019-11-11 NOTE — Progress Notes (Signed)
Inpatient Rehabilitation  Patient information reviewed and entered into eRehab system by Adrin Julian M. Danyel Tobey, M.A., CCC/SLP, PPS Coordinator.  Information including medical coding, functional ability and quality indicators will be reviewed and updated through discharge.    

## 2019-11-11 NOTE — Evaluation (Signed)
Occupational Therapy Assessment and Plan  Patient Details  Name: [name] MRN: [id number] Date of Birth: November 10, 1950  OT Diagnosis: acute pain, muscle weakness (generalized) and pain in joint Rehab Potential: Rehab Potential (ACUTE ONLY): Excellent ELOS: 5-7 days   Today's Date: 11/11/2019 OT Individual Time: 2952-8413 OT Individual Time Calculation (min): 59 min     Hospital Problem: Active Problems:   Left acetabular fracture [facility])   Past Medical History:  Past Medical History:  Diagnosis Date  . Allergy   . Anxiety   . Carotid artery occlusion    40-60% right ICA stenosis (09/2008)  . Cataract   . Chronic back pain   . CLL (chronic lymphoblastic leukemia) dx 2010   Followed at [facility] q42mo no current therapy   . [place]   . Coronary artery disease 2010   s/p CABG '10, sees Dr. HPercival Spanish . Depression   . Diverticulosis   . D[place], [place] extremity, [place], [place]   LLE, chronic anticoag since 2009  . Esophagitis   . Fibromyalgia   . Gallstones   . GERD (gastroesophageal reflux disease)   . Gout   . Gynecomastia, male   . H/O hiatal hernia 2008   surgery  . Hemorrhoids   . Hepatitis A yrs ago  . HIV infection ([place] 1993  . Hypertension   . Impotence of organic origin   . [place]    x 2  . Neuromuscular disorder (HCC)    neuropathy  . Osteoarthritis, knee    s/p B TKA  . Osteoporosis   . PONV (postoperative nausea and vomiting)   . [place]monary fibrosis [place]  . Rheumatoid arthritis(714.0) dx 2010   MTX, follows with rheum  . Seasonal allergies   . Secondary syphilis 07/24/14 dx   s/p 2 wks doxy  . Status post dilation of esophageal narrowing   . Stroke ([facility] 1969   TIA  . TIA (transient ischemic attack) 1997   mild residual L mouth droop  . Tubular adenoma of colon    Past Surgical History:  Past Surgical History:  Procedure Laterality Date  . ACETABULAR REVISION Left 11/03/2019   REVISION  LEFT HIP ACETABULAR COMPONENT (Left Hip)  . ANTERIOR HIP REVISION Left 08/17/2018   Procedure: ANTERIOR LEFT HIP REVISION;  Surgeon: BMcarthur Rossetti MD;  [place]  Service: Orthopedics;  Laterality: Left;  . ANTERIOR HIP REVISION Left 11/02/2018   Procedure: LEFT HIP CONSTRAINED LINER REVISION;  Surgeon: BMcarthur Rossetti MD;  [place]  Service: Orthopedics;  Laterality: Left;  . ANTERIOR HIP REVISION Left 11/03/2019   Procedure: REVISION LEFT HIP ACETABULAR COMPONENT;  Surgeon: BMcarthur Rossetti MD;  [place]  Service: Orthopedics;  Laterality: Left;  . BACK SURGERY  2010 & 2017  . CHOLECYSTECTOMY    . COLONOSCOPY WITH PROPOFOL N/A 12/28/2012   Procedure: COLONOSCOPY WITH PROPOFOL;  Surgeon: JJerene Bears MD;  [facility];  Service: Gastroenterology;  Laterality: N/A;  . CORONARY ARTERY BYPASS GRAFT  2010   triple bypass  . ESOPHAGOGASTRODUODENOSCOPY (EGD) WITH PROPOFOL N/A 12/28/2012   Procedure: ESOPHAGOGASTRODUODENOSCOPY (EGD) WITH PROPOFOL;  Surgeon: JJerene Bears MD;  [facility];  Service: Gastroenterology;  Laterality: N/A;  . ESOPHAGOGASTRODUODENOSCOPY (EGD) WITH PROPOFOL N/A 03/15/2013   Procedure: ESOPHAGOGASTRODUODENOSCOPY (EGD) WITH PROPOFOL;  Surgeon: JJerene Bears MD;  [facility];  Service: Gastroenterology;  Laterality: N/A;  . ESOPHAGOGASTRODUODENOSCOPY (EGD) WITH PROPOFOL N/A  02/07/2016   Procedure: ESOPHAGOGASTRODUODENOSCOPY (EGD) WITH PROPOFOL;  Surgeon: Beverley Fiedler, MD;  [facility];  Service: Gastroenterology;  Laterality: N/A;  . HARDWARE REMOVAL N/A 07/02/2012   Procedure: HARDWARE REMOVAL;  Surgeon: Mariam Dollar, MD;  [facility];  Service: Neurosurgery;  Laterality: N/A;  . HIATAL HERNIA REPAIR     wrap  . HIP CLOSED REDUCTION Left 08/15/2018   Procedure: CLOSED REDUCTION HIP;  Surgeon: Nadara Mustard, MD;  [facility];  Service: Orthopedics;  Laterality: Left;  . HIP  CLOSED REDUCTION Left 08/28/2018   Procedure: CLOSED REDUCTION HIP FOR RECURRENT DISLOCATION AND DRESSING CHANGE;  Surgeon: Kerrin Champagne, MD;  [facility];  Service: Orthopedics;  Laterality: Left;  . HIP CLOSED REDUCTION Left 09/15/2018   Procedure: CLOSED REDUCTION HIP DISLOCATION;  Surgeon: Kathryne Hitch, MD;  [facility];  Service: Orthopedics;  Laterality: Left;  . HIP CLOSED REDUCTION Left 10/31/2018   Procedure: CLOSED REDUCTION LEFT TOTAL HIP;  Surgeon: Tarry Kos, MD;  [facility];  Service: Orthopedics;  Laterality: Left;  . INGUINAL HERNIA REPAIR Bilateral   . JOINT REPLACEMENT Left 1999  . KNEE ARTHROPLASTY  07/22/2011   Procedure: COMPUTER ASSISTED TOTAL KNEE ARTHROPLASTY;  Surgeon: Cammy Copa, MD;  Location: [facility] OR;  Service: Orthopedics;  Laterality: Right;  Right total knee arthroplasty  . MANDIBLE SURGERY Bilateral    tmj  . REPLACEMENT TOTAL KNEE Bilateral   . ring around testicle hernia reapir  184 and 1986   x 2  . ROTATOR CUFF REPAIR Right   . SHOULDER SURGERY Left   . SPINE SURGERY  2010   "rod and screws", "failed", lopwer spine,   . stent to heart x 1  2010  . TONSILLECTOMY    . TOTAL HIP ARTHROPLASTY Left 08/13/2018   Procedure: LEFT TOTAL HIP ARTHROPLASTY ANTERIOR APPROACH;  Surgeon: Kathryne Hitch, MD;  [facility];  Service: Orthopedics;  Laterality: Left;  . TOTAL HIP ARTHROPLASTY Left 09/17/2018   Procedure: ANTERIOR LEFT HIP REVISION;  Surgeon: Kathryne Hitch, MD;  [facility];  Service: Orthopedics;  Laterality: Left;  . UMBILICAL HERNIA REPAIR     x 1  . varicose vein     stripping  . VIDEO BRONCHOSCOPY Bilateral 10/16/2015   Procedure: VIDEO BRONCHOSCOPY WITHOUT FLUORO;  Surgeon: Kalman Shan, MD;  [facility];  Service: Cardiopulmonary;  Laterality: Bilateral;    Assessment & Plan Clinical Impression: Patient is a 69 y.o. year old male with recent admission to the hospital on Presented  11/03/2019 after mechanical fall without loss of consciousness.  X-rays and imaging revealed progressive loosening of left hip acetabular component.  The acetabular component had become more medialized and more vertical after fall.  There were noted fracture lines in the pelvis on CT scan.  Patient underwent revision left hip acetabular component 11/03/2019 per Dr. Magnus Ivan..  Patient transferred to CIR on 11/10/2019 .    Patient currently requires min with basic self-care skills secondary to muscle weakness and decreased standing balance and decreased balance strategies.  Prior to hospitalization, patient could complete ADLs with modified independent .  Patient will benefit from skilled intervention to decrease level of assist with basic self-care skills and increase independence with basic self-care skills prior to discharge home with care partner.  Anticipate patient will require intermittent supervision and follow up home health.  OT - End of Session Activity Tolerance: Improving Endurance Deficit: Yes OT Assessment  Rehab Potential (ACUTE ONLY): Excellent OT Patient demonstrates impairments in the following area(s): Balance;Pain OT Basic ADL's Functional Problem(s): Grooming;Bathing;Dressing;Toileting OT Transfers Functional Problem(s): Toilet;Tub/Shower OT Additional Impairment(s): None OT Plan OT Intensity: Minimum of 1-2 x/day, 45 to 90 minutes OT Frequency: 5 out of 7 days OT Duration/Estimated Length of Stay: 5-7 days OT Treatment/Interventions: Balance/vestibular training;Discharge planning;Pain management;Self Care/advanced ADL retraining;Therapeutic Activities;UE/LE Coordination activities;Disease mangement/prevention;Functional mobility training;Patient/family education;Therapeutic Exercise;DME/adaptive equipment instruction;Neuromuscular re-education;UE/LE Strength taining/ROM;Wheelchair propulsion/positioning OT Self Feeding Anticipated Outcome(s): independent OT Basic Self-Care  Anticipated Outcome(s): supervision OT Toileting Anticipated Outcome(s): supervision OT Bathroom Transfers Anticipated Outcome(s): supervision OT Recommendation Patient destination: Home Follow Up Recommendations: Home health OT Equipment Recommended: To be determined   Skilled Therapeutic Intervention Session 1: [phone])  Pt completed selfcare retraining sit to stand from the EOB.  He was able to transition to sitting with supervision and then he completed all UB selfcare with setup as well.  Min assist was needed for sit to stand with LB bathing with assist needed for washing lower legs secondary to not having AE.  He needed mod assist for LB dressing as well secondary to having to follow THR precautions.  Pt reports having AE at home and being proficient with it's use.  He discussed using and elevated walker at home as well which his daughter is planning to bring in so we can see how well he uses it for transfers, and if he can maintain the TDWBing restrictions.  Finished session with setup of oral hygiene and then he transferred back to the bed with supervision.  Call button and phone in reach with safety bed alarm in place.   Session 2:  (1300-1417)  Pt in bed to start, agreeable to participation in session. Had him transition to the EOB with supervision and work on stand pivot transfer to the wheelchair using his elevated walker from home.  He needed min assist for sit to stand and demonstrated decreased ability to maintain TDWBing over the LLE during this trial.  Attempted sit to stand again from higher bed similar to what he has at home, and he was able to complete with min guard and maintain TDWBing.  Worked during session on home setup and completion of toilet transfers and toileting the safest method.  Discussed use of his wheelchair vs scooter for transfer to the bathroom to get on the elevated toilet as well at to get in the shower.  He will use the scooter as his wheelchair does not have  removable arm rests and he currently prefers completion of squat pivot transfers to the toilet and tub bench as he feels safer.  Discussed need for a tub bench in his walk-in shower as the built in seat will not allow for squat pivot transfer.  Also discussed possible need for drop arm commode instead of toilet riser, as he states the riser tends to move on him.  Will continue discussion and practice, but this session he was able to complete squat pivot transfers to the bed and wheelchair with min guard assist.  He also completed simulated clothing management in sitting with lateral leans side to side on the bed.  He will likely remove his clothing for BMs on the bed and then transfer to his scooter and into the bathroom to the drop arm commode or the toilet riser.  Will continue with practice and problem solving of these tasks to make them as efficient as possible.  Finished session with pt sitting up in the wheelchair with  call button and phone in reach.    OT Evaluation Precautions/Restrictions  Precautions Precautions: Fall;Posterior Hip Precaution Comments: TDWB on LLE for tranfsers only Restrictions Weight Bearing Restrictions: Yes LLE Weight Bearing: Touchdown weight bearing Therapy Vitals Temp: 98.1 F (36.7 C) Temp Source: Oral Pulse Rate: 89 Resp: 16 BP: 99/65 Patient Position (if appropriate): Lying Oxygen Therapy SpO2: 99 % O2 Device: Room Air Pain Pain Assessment Pain Scale: 0-10 Pain Score: 2  Pain Type: Acute pain;Surgical pain Pain Location: Hip Pain Orientation: Left Pain Descriptors / Indicators: Discomfort Pain Onset: On-going Pain Intervention(s): Medication (See eMAR);Rest;Repositioned;Relaxation Home Living/Prior Functioning Home Living Family/patient expects to be discharged to:: Private residence Living Arrangements: Children Available Help at Discharge: Family, Available 24 hours/day Type of Home: Mobile home Home Access: Ramped entrance Entrance  Stairs-Rails: Right, Left Home Layout: One level Bathroom Toilet: Handicapped height (has toilet riser) Bathroom Accessibility: Yes  Lives With: Alone IADL History Homemaking Responsibilities: Yes Meal Prep Responsibility: Primary Current License: Yes Occupation: Retired Prior Function Level of Independence: Independent with basic ADLs  Able to [place]?: No Driving: Yes Comments: has been able to walk well until recent increase in L hip pain; used UP walker or cane in the house - was able to ambulate up/down ramp to/from truck using cane; has 2 electric scooters (a zero turn for the house and a larger one for outside) and 1 power wheelchair (shaped like standard w/c) but footplate is not removeable ADL ADL Eating: Independent Where Assessed-Eating: Bed level Grooming: Setup Where Assessed-Grooming: Edge of bed Upper Body Bathing: Supervision/safety Where Assessed-Upper Body Bathing: Edge of bed Lower Body Bathing: Minimal assistance Where Assessed-Lower Body Bathing: Edge of bed Upper Body Dressing: Setup Where Assessed-Upper Body Dressing: Edge of bed Lower Body Dressing: Moderate assistance Where Assessed-Lower Body Dressing: Edge of bed Toileting: Minimal assistance Where Assessed-Toileting: Bedside Commode Toilet Transfer: Minimal assistance Toilet Transfer Method: Stand pivot Toilet Transfer Equipment: Bedside commode Vision Baseline Vision/History: Wears glasses Wears Glasses: At all times Patient Visual Report: No change from baseline Vision Assessment?: No apparent visual deficits Perception  Perception: Within Functional Limits Praxis Praxis: Intact Cognition Overall Cognitive Status: Within Functional Limits for tasks assessed Arousal/Alertness: Awake/alert Orientation Level: Person;Place;Situation Person: Oriented Place: Oriented Situation: Oriented Year: 2021 Month: July Day of Week: Correct Memory: Appears intact Immediate Memory Recall:  Sock;Blue;Bed Memory Recall Sock: Without Cue Memory Recall Blue: Without Cue Memory Recall Bed: Without Cue Attention: Focused;Sustained;Selective Focused Attention: Appears intact Sustained Attention: Appears intact Selective Attention: Appears intact Awareness: Appears intact Safety/Judgment: Appears intact Sensation Sensation Light Touch: Impaired Detail Peripheral sensation comments: bilateral peripheral neuropathy Light Touch Impaired Details: Impaired RUE;Impaired LUE Hot/Cold: Not tested Proprioception: Impaired Detail Proprioception Impaired Details: Impaired RLE;Impaired LLE Stereognosis: Not tested Additional Comments: Pt reports history of peripheral neuropathy in all extremities Coordination Gross Motor Movements are Fluid and Coordinated: No Fine Motor Movements are Fluid and Coordinated: No Coordination and Movement Description: Pt with history of bilateral rotator cuff injurines as well as moderate arthritic changes in his hands, resulting in decreased FM and gross motor use. Motor  Motor Motor: Within Functional Limits Motor - Skilled Clinical Observations: generalized weakness and deconditioning (significant weakness in L hip musculature) Mobility  Bed Mobility Bed Mobility: Supine to Sit;Sit to Supine Supine to Sit: Supervision/Verbal cueing Sit to Supine: Supervision/Verbal cueing Transfers Sit to Stand: Minimal Assistance - Patient > 75% Stand to Sit: Minimal Assistance - Patient > 75%  Trunk/Postural Assessment  Cervical Assessment Cervical Assessment: Exceptions to [facility] (  forward head) Thoracic Assessment Thoracic Assessment: Exceptions to [facility] (thoracic kyphosis) Lumbar Assessment Lumbar Assessment: Exceptions to [facility] (increased lumbar flexion in standing) Postural Control Postural Control: Within Functional Limits  Balance Balance Balance Assessed: Yes Static Sitting Balance Static Sitting - Balance Support: Feet supported Static Sitting - Level  of Assistance: 7: Independent Dynamic Sitting Balance Dynamic Sitting - Balance Support: During functional activity Dynamic Sitting - Level of Assistance: 5: Stand by assistance Static Standing Balance Static Standing - Balance Support: During functional activity;Bilateral upper extremity supported Static Standing - Level of Assistance: 5: Stand by assistance Dynamic Standing Balance Dynamic Standing - Balance Support: During functional activity;Bilateral upper extremity supported Dynamic Standing - Level of Assistance: 4: Min assist Extremity/Trunk Assessment RUE Assessment RUE Assessment: Exceptions to [facility] Passive Range of Motion (PROM) Comments: WFLS for shoulders and elbows Active Range of Motion (AROM) Comments: shoulder flexion 0-60 degrees, elbow flexion/extension AROM WFLs, digit flexion/extension WFLs but not arthritic changes in the joints General Strength Comments: shoulder 2-/5, elbow flexion/ext 4/5, grip 3+/5 LUE Assessment LUE Assessment: Exceptions to [facility] Passive Range of Motion (PROM) Comments: WFLs for elbow and shoulder Active Range of Motion (AROM) Comments: shoulder flexion 0-90 degrees, elbow flexion/extension AROM WFLs, digit flexion/extension WFLs but not arthritic changes in the joints General Strength Comments: shoulder 3-/5, grip 3+/5, elbow flexion and extension 4/5     Refer to Care Plan for Long Term Goals  Recommendations for other services: None    Discharge Criteria: Patient will be discharged from OT if patient refuses treatment 3 consecutive times without medical reason, if treatment goals not met, if there is a change in medical status, if patient makes no progress towards goals or if patient is discharged from hospital.  The above assessment, treatment plan, treatment alternatives and goals were discussed and mutually agreed upon: by patient  Keisean Skowron OTR/L 11/11/2019, 4:03 PM

## 2019-11-12 ENCOUNTER — Inpatient Hospital Stay (HOSPITAL_COMMUNITY): Payer: Medicare Other | Admitting: Occupational Therapy

## 2019-11-12 ENCOUNTER — Inpatient Hospital Stay (HOSPITAL_COMMUNITY): Payer: Medicare Other

## 2019-11-12 ENCOUNTER — Inpatient Hospital Stay (HOSPITAL_COMMUNITY): Payer: Medicare Other | Admitting: Physical Therapy

## 2019-11-12 DIAGNOSIS — R001 Bradycardia, unspecified: Secondary | ICD-10-CM

## 2019-11-12 NOTE — Progress Notes (Signed)
Occupational Therapy Session Note  Patient Details  Name: [name] MRN: [id number] Date of Birth: Feb 07, 1951  Today's Date: 11/12/2019 OT Individual Time: 0958-1100 OT Individual Time Calculation (min): 62 min    Short Term Goals: Week 1:  OT Short Term Goal 1 (Week 1): STGs equal LTGs based on ELOS  Skilled Therapeutic Interventions/Progress Updates:    Patient seated in w/c, alert and ready for therapy session.  He denies pain at this time.  Reviewed DME options and set up of home environment.  Completed grooming tasks seated at sink with set up.  Completed UB bathing and dressing with set up.  He declined LB bathing dressing at this time.  Completed sit pivot transfer to/from tub bench with CGA/CS after initial review of process.   Discussed pros/cons of various shower benches - he determined style that he prefers and ordered on [employer].  Completed sit pivot transfer w/c to bed with set up and CGA.  He is able to manage bilateral LEs into bed with CS.  He demonstrates good understanding of THR and current precautions.  He remained in bed at close of session, bed alarm set and call bell in reach.    Therapy Documentation Precautions:  Precautions Precautions: Fall, Posterior Hip Precaution Comments: TDWB on LLE for tranfsers only Restrictions Weight Bearing Restrictions: Yes LLE Weight Bearing: Touchdown weight bearing   Therapy/Group: Individual Therapy  Carlos Levering 11/12/2019, 7:39 AM

## 2019-11-12 NOTE — Plan of Care (Signed)
  Problem: Consults Goal: RH GENERAL PATIENT EDUCATION Description: See Patient Education module for education specifics. Outcome: Progressing   Problem: RH SKIN INTEGRITY Goal: RH STG MAINTAIN SKIN INTEGRITY WITH ASSISTANCE Description: STG Maintain Skin Integrity With mod I Assistance. Outcome: Progressing   Problem: RH SAFETY Goal: RH STG ADHERE TO SAFETY PRECAUTIONS W/ASSISTANCE/DEVICE Description: STG Adhere to Safety Precautions With cues and reminders.  Outcome: Progressing Goal: RH STG DECREASED RISK OF FALL WITH ASSISTANCE Description: STG Decreased Risk of Fall With cues and reminders.  Outcome: Progressing   Problem: RH PAIN MANAGEMENT Goal: RH STG PAIN MANAGED AT OR BELOW PT'S PAIN GOAL Description: Pain level less than 4 on scale of 0-10 Outcome: Progressing   Problem: RH KNOWLEDGE DEFICIT GENERAL Goal: RH STG INCREASE KNOWLEDGE OF SELF CARE AFTER HOSPITALIZATION Description: Pt will be able to demonstrate understanding of proper precautions and weight bearing restrictions to take post hip replacement to prevent further dislocation and possible complications independently using handouts. Pt will be able to demonstrate understanding of pain management using pharmacologic and nonpharmacologic interventions independently.   Outcome: Progressing

## 2019-11-12 NOTE — Progress Notes (Signed)
Physical Therapy Session Note  Patient Details  Name: AKSHAJ BESANCON MRN: [id number] Date of Birth: 1950/07/14  Today's Date: 11/12/2019 PT Individual Time: 1310-1410 PT Individual Time Calculation (min): 60 min   Short Term Goals: Week 1:  PT Short Term Goal 1 (Week 1): = to LTGs based on ELOS  Skilled Therapeutic Interventions/Progress Updates:   Pt received supine in bed and agreeable to therapy session. Pt UP walker now in his room. Pt reports he had a good OT session this AM problem solving bathroom set-up and shower transfers for home environment. Reports they discussed using the power w/c and taking the foot plate off and pt holding his legs up when moving around in w/c as this is smaller and can fit in/out of his bathroom - therapist in agreement. Continues to report the plan is to perform lateral scoot transfers to/from power w/c so he doesn't have to worry about maneuvering the UP walker and power w/c at same time. Supine>sitting R EOB supervision. L stand pivot to w/c using UP walker with CGA for steadying - pt safer with this compared to [facility] walker as the UP walker has brakes.  Transported to/from gym in w/c for time management and energy conservation. Pt reports his next concern is toilet transfers - discussed layout of his bathrooms and decided he could use the front bathroom because he can park the w/c next to the toilet - discussed possible need for bariatric BSC as it has a larger flat surface at the top allowing him to more safely perform lateral scoot transfer. Performed lateral scoot transfer w/c<>recliner with supervision going downhill to recliner and CGA going uphill to w/c - pt reports his recliner is taller than ours allowing a more level transfer. In ADL apartment kitchen discussed how to rearrange items in kitchen to allow access from w/c height, how to maneuver w/c around getting in/out low freezer drawer on fridge (pt planning to eat primarily freezer meals), the need to carry  items in his lap so placing something over his legs for protection when carrying hot/cold items. Transported back to room. R lateral scoot to EOB with supervision. Sit>supine with supervision (less reliance on R LE support for L LE management). Pt left supine in bed with needs in reach and bed alarm on.  Therapy Documentation Precautions:  Precautions Precautions: Fall, Posterior Hip Precaution Comments: TDWB on LLE for tranfsers only Restrictions Weight Bearing Restrictions: Yes LLE Weight Bearing: Touchdown weight bearing  Pain: No reports of pain throughout session.   Therapy/Group: Individual Therapy  Tawana Scale, PT, DPT, CSRS  11/12/2019, 12:29 PM

## 2019-11-12 NOTE — Progress Notes (Signed)
[facility] PHYSICAL MEDICINE & REHABILITATION PROGRESS NOTE   Subjective/Complaints: No significant pain.  Patient has not noticed any leakage through the bandage left hip.  No other new complaints this morning. Patient indicates he was contacted by his pulmonologist on the results of the sleep study.  He had significant desaturation is very well his bradycardia at night.  He advised oxygen at night. Doppler results reviewed   ROS- neg CP SOB, N/V/D  Objective:   VAS [place] LOWER EXTREMITY VENOUS (DVT)  Result Date: 11/11/2019  Lower Venous DVTStudy Indications: Swelling.  Comparison Study: 11/17/18 previous Performing Technologist: Abram Sander RVS  Examination Guidelines: A complete evaluation includes B-mode imaging, spectral Doppler, color Doppler, and power Doppler as needed of all accessible portions of each vessel. Bilateral testing is considered an integral part of a complete examination. Limited examinations for reoccurring indications may be performed as noted. The reflux portion of the exam is performed with the patient in reverse Trendelenburg.  +---------+---------------+---------+-----------+----------+--------------+ RIGHT    CompressibilityPhasicitySpontaneityPropertiesThrombus Aging +---------+---------------+---------+-----------+----------+--------------+ CFV      Full           Yes      Yes                                 +---------+---------------+---------+-----------+----------+--------------+ SFJ      Full                                                        +---------+---------------+---------+-----------+----------+--------------+ FV Prox  Full                                                        +---------+---------------+---------+-----------+----------+--------------+ FV Mid   Full                                                        +---------+---------------+---------+-----------+----------+--------------+ FV DistalFull                                                         +---------+---------------+---------+-----------+----------+--------------+ PFV      Full                                                        +---------+---------------+---------+-----------+----------+--------------+ POP      Full           Yes      Yes                                 +---------+---------------+---------+-----------+----------+--------------+ PTV  Full                                                        +---------+---------------+---------+-----------+----------+--------------+ PERO     Full                                                        +---------+---------------+---------+-----------+----------+--------------+   +---------+---------------+---------+-----------+----------+--------------+ LEFT     CompressibilityPhasicitySpontaneityPropertiesThrombus Aging +---------+---------------+---------+-----------+----------+--------------+ CFV      Full           Yes      Yes                                 +---------+---------------+---------+-----------+----------+--------------+ SFJ      Full                                                        +---------+---------------+---------+-----------+----------+--------------+ FV Prox  Full                                                        +---------+---------------+---------+-----------+----------+--------------+ FV Mid   Full                                                        +---------+---------------+---------+-----------+----------+--------------+ FV DistalFull                                                        +---------+---------------+---------+-----------+----------+--------------+ PFV      Full                                                        +---------+---------------+---------+-----------+----------+--------------+ POP      Full           Yes      Yes                                  +---------+---------------+---------+-----------+----------+--------------+ PTV      Full                                                        +---------+---------------+---------+-----------+----------+--------------+  PERO     Full                                                        +---------+---------------+---------+-----------+----------+--------------+     Summary: BILATERAL: - No evidence of deep vein thrombosis seen in the lower extremities, bilaterally. -No evidence of popliteal cyst, bilaterally.   *See table(s) above for measurements and observations. Electronically signed by Monica Martinez MD on 11/11/2019 at 6:30:17 PM.    Final    Recent Labs    11/11/19 0533  WBC 9.8  HGB 7.8*  HCT 25.2*  PLT 107*   Recent Labs    11/11/19 0533  NA 137  K 3.6  CL 101  CO2 29  GLUCOSE 106*  BUN 6*  CREATININE 0.71  CALCIUM 7.9*    Intake/Output Summary (Last 24 hours) at 11/12/2019 1120 Last data filed at 11/12/2019 0900 Gross per 24 hour  Intake 780 ml  Output 2050 ml  Net -1270 ml     Physical Exam: Vital Signs Blood pressure (!) 135/94, pulse 100, temperature (!) 97.5 F (36.4 C), temperature source Oral, resp. rate 18, height 5\' 9"  (1.753 m), weight 78 kg, SpO2 94 %.   General: No acute distress Mood and affect are appropriate Heart: Regular rate and rhythm no rubs murmurs or extra sounds Lungs: Clear to auscultation, breathing unlabored, no rales or wheezes Abdomen: Positive bowel sounds, soft nontender to palpation, nondistended Extremities: No clubbing, cyanosis, or edema Skin: No evidence of breakdown, no evidence of rash Left thigh wound draining dark serosanguinous fluid, no erythema, no tenderness, could not express additional fluid    Neurologic: Cranial nerves II through XII intact, motor strength is 5/5 in bilateral deltoid, bicep, tricep, grip, RIght hip flexor, knee extensors, ankle dorsiflexor and plantar flexor LLE trace HF, 3/5  KE  Musculoskeletal:Reduce ROM Left hip   Assessment/Plan: 1. Functional deficits secondary to Left hip infection with revision of acetabular component which require 3+ hours per day of interdisciplinary therapy in a comprehensive inpatient rehab setting.  Physiatrist is providing close team supervision and 24 hour management of active medical problems listed below.  Physiatrist and rehab team continue to assess barriers to discharge/monitor patient progress toward functional and medical goals  Care Tool:  Bathing    Body parts bathed by patient: Right arm, Left arm, Chest, Abdomen, Front perineal area, Right upper leg, Left upper leg, Face   Body parts bathed by helper: Right lower leg, Left lower leg Body parts n/a: Buttocks (nursing completed earlier)   Bathing assist Assist Level: Minimal Assistance - Patient > 75%     Upper Body Dressing/Undressing Upper body dressing Upper body dressing/undressing activity did not occur (including orthotics): N/A What is the patient wearing?: Pull over shirt    Upper body assist Assist Level: Set up assist    Lower Body Dressing/Undressing Lower body dressing    Lower body dressing activity did not occur: N/A What is the patient wearing?: Pants     Lower body assist Assist for lower body dressing: Moderate Assistance - Patient 50 - 74%     Toileting Toileting Toileting Activity did not occur Landscape architect and hygiene only):  (Urinal)  Toileting assist Assist for toileting: Minimal Assistance - Patient > 75% Assistive Device Comment: Urinal   Transfers Chair/bed transfer  Transfers assist  Chair/bed transfer activity did not occur: N/A  Chair/bed transfer assist level: Minimal Assistance - Patient > 75%     Locomotion Ambulation   Ambulation assist   Ambulation activity did not occur: Safety/medical concerns          Walk 10 feet activity   Assist  Walk 10 feet activity did not occur: Safety/medical  concerns        Walk 50 feet activity   Assist Walk 50 feet with 2 turns activity did not occur: Safety/medical concerns         Walk 150 feet activity   Assist Walk 150 feet activity did not occur: Safety/medical concerns         Walk 10 feet on uneven surface  activity   Assist Walk 10 feet on uneven surfaces activity did not occur: Safety/medical concerns         Wheelchair     Assist Will patient use wheelchair at discharge?: No (will use electric scooter)             Wheelchair 50 feet with 2 turns activity    Assist            Wheelchair 150 feet activity     Assist          Blood pressure (!) 135/94, pulse 100, temperature (!) 97.5 F (36.4 C), temperature source Oral, resp. rate 18, height 5\' 9"  (1.753 m), weight 78 kg, SpO2 94 %.  Medical Problem List and Plan: 1.  Decreased functional mobility secondary to failed left hip s/p revision of acetabular component 11/03/2019 with history of left total hip arthroplasty 07/13/1960 complicated by multiple dislocations and recent fall             -patient may shower but incision must be covered.              -ELOS/Goals: modI in 10-14 days 2.  Antithrombotics: -DVT/anticoagulation: SCDs. Negative LE vascular study             -antiplatelet therapy: Aspirin 81 mg twice daily 3. Pain Management: Lyrica 200 mg twice daily, oxycodone and Robaxin as needed. Pain is currently well controlled.  4. Mood: Lexapro 20 mg daily             -antipsychotic agents: N/A 5. Neuropsych: This patient is capable of making decisions on his own behalf. 6. Skin/Wound Care: Daily inspection of left hip. Incision is currently with bloody drainage, to be expected as per ortho.  7. Fluids/Electrolytes/Nutrition: Routine in and outs with follow-up chemistries poor recorded po fluid intake  8.  Acute blood loss anemia.  Follow-up CBC. 6/29 Hgb 7.7, 7.8 on 7/2 stable  9.  HIV diagnosed 28.  Follow-up infectious  disease.  Continue continue antiretroviral medications 10.  ID/enterococcal prosthetic infection left hip.  Continue intravenous ampicillin 2 g every 4 hours x6 weeks then converting to oral amoxicillin.  Follow-up per infectious disease Dr. Michel Bickers 11.  Hypertension.  Toprol 50 mg daily, Demadex 20 mg daily.  Monitor with increased mobility              HR well controlled. Decrease Torsemide to 10mg  daily.  Vitals:   11/11/19 2036 11/12/19 0406  BP: 107/63 (!) 135/94  Pulse: 77 100  Resp: 18 18  Temp: 98.9 F (37.2 C) (!) 97.5 F (36.4 C)  SpO2: 96% 94%  HTN controlled 7/3 12.  Hyperlipidemia.  Lipitor/Zetia 13.  BPH.  Flomax 0.4 mg daily.  10.  Sleep apnea start nocturnal O2  LOS: 2 days A FACE TO FACE EVALUATION WAS PERFORMED  Charlett Blake 11/12/2019, 11:20 AM

## 2019-11-12 NOTE — IPOC Note (Addendum)
Overall Plan of Care [facility]) Patient Details Name: [name] MRN: [id number] DOB: 01/17/1951  Admitting Diagnosis: Left Hip joint septic arthritis Hospital Problems: Active Problems:   Left acetabular fracture ([place])     Functional Problem List: Nursing Pain, Safety, Endurance, Motor, Medication Management  PT Balance, Perception, Safety, Edema, Sensory, Endurance, Motor, Skin Integrity, Nutrition, Pain  OT Balance, Pain  SLP    TR         Basic ADL's: OT Grooming, Bathing, Dressing, Toileting     Advanced  ADL's: OT       Transfers: PT Bed Mobility, Bed to Chair, Furniture, Teacher, early years/pre, Metallurgist: PT Wheelchair Mobility     Additional Impairments: OT None  SLP        TR      Anticipated Outcomes Item Anticipated Outcome  Self Feeding independent  Swallowing      Basic self-care  supervision  Toileting  supervision   Bathroom Transfers supervision  Bowel/Bladder  remain continent of bowel and bladder with mod I assist  Transfers  supervision stand pivot using walker  Locomotion  unable to ambulate due to [employer] precautions  Communication     Cognition     Pain  Pain level less than 4 on scale of 0-10  Safety/Judgment  Remain free of injury, prevent falls with cues and reminders.   Therapy Plan: PT Intensity: Minimum of 1-2 x/day ,45 to 90 minutes PT Frequency: 5 out of 7 days PT Duration Estimated Length of Stay: ~5-7 days OT Intensity: Minimum of 1-2 x/day, 45 to 90 minutes OT Frequency: 5 out of 7 days OT Duration/Estimated Length of Stay: 5-7 days     Due to the current state of emergency, patients may not be receiving their 3-hours of Medicare-mandated therapy.   Team Interventions: Nursing Interventions Patient/Family Education, Skin Care/Wound Management, Pain Management, Psychosocial Support, Discharge Planning, Medication Management, Disease Management/Prevention  PT interventions Community reintegration,  DME/adaptive equipment instruction, Neuromuscular re-education, Psychosocial support, Wheelchair propulsion/positioning, Training and development officer, Discharge planning, Pain management, Skin care/wound management, Therapeutic Activities, UE/LE Coordination activities, Disease management/prevention, Functional mobility training, Splinting/orthotics, Patient/family education, Therapeutic Exercise, UE/LE Strength taining/ROM  OT Interventions Balance/vestibular training, Discharge planning, Pain management, Self Care/advanced ADL retraining, Therapeutic Activities, UE/LE Coordination activities, Disease mangement/prevention, Functional mobility training, Patient/family education, Therapeutic Exercise, DME/adaptive equipment instruction, Neuromuscular re-education, UE/LE Strength taining/ROM, Wheelchair propulsion/positioning  SLP Interventions    TR Interventions    SW/CM Interventions Discharge Planning, Psychosocial Support, Patient/Family Education   Barriers to Discharge MD  Medical stability, IV antibiotics, Wound care and Weight bearing restrictions  Nursing      PT Lack of/limited family support, Weight bearing restrictions, IV antibiotics    OT      SLP      SW       Team Discharge Planning: Destination: PT-Home ,OT- Home , SLP-  Projected Follow-up: PT-Home health PT, 24 hour supervision/assistance, OT-  Home health OT, SLP-  Projected Equipment Needs: PT-To be determined, OT- To be determined, SLP-  Equipment Details: PT- , OT-  Patient/family involved in discharge planning: PT- Patient,  OT-Patient, SLP-   MD ELOS: 7d Medical Rehab Prognosis:  Good Assessment:  69 year old right-handed male with history of chronic back pain/fibromyalgia, CLL currently on no therapy, CAD with CABG 2010, hypertension, HIV infection diagnosed 1993, TIA 1997, lateral knee surgery, patient underwent left total hip arthroplasty April of last year postoperatively he had instability and recurrent  dislocations requiring several  other surgeries through June of last year and in late October he fell sustaining a periprosthetic fracture around the femoral stem.  A CT scan in December showed some lucency around some of the acetabular screws.  .  Per chart review patient lives alone.  Independent with assistive device.  1 level home 5 steps to entry.  Presented 11/03/2019 after mechanical fall without loss of consciousness.  X-rays and imaging revealed progressive loosening of left hip acetabular component.  The acetabular component had become more medialized and more vertical after fall.  There were noted fracture lines in the pelvis on CT scan.  Patient underwent revision left hip acetabular component 11/03/2019 per Dr. Ninfa Linden.  Touchdown weightbearing left lower extremity.  Maintain on aspirin 81 mg twice daily for DVT prophylaxis.  Acute blood loss anemia 7.7.  Wound culture showed Enterococcus with infectious disease consulted Dr. Michel Bickers and placed on IV vancomycin initially and transitioned to intravenous ampicillin that he will continue on for 6 weeks before converting to oral amoxicillin   Now requiring 24/7 Rehab RN,MD, as well as CIR level PT, OT and SLP.  Treatment team will focus on ADLs and mobility with goals set at sup See Team Conference Notes for weekly updates to the plan of care

## 2019-11-12 NOTE — Progress Notes (Signed)
Physical Therapy Session Note  Patient Details  Name: [name] MRN: [id number] Date of Birth: 11-28-1950  Today's Date: 11/12/2019 PT Individual Time: 0803-0914 PT Individual Time Calculation (min): 71 min   Short Term Goals: Week 1:  PT Short Term Goal 1 (Week 1): = to LTGs based on ELOS  Skilled Therapeutic Interventions/Progress Updates:     Pt received supine in bed and agreeable to therapy. Reports pain in L hip. Number not provided. PT repositions and provides rest breaks as needed to manage pain symptoms. Bed mobility with supervision and cues for hand placement and sequencing to facilitate. Pt performs bed to WC lateral transfer with CGA going toward the R. WC transport to gym for time management. Pt performs stand pivot transfer with EVO walker from WC to mat table. Sit to supine on mat table with minA, and pt positioned on wedge for comfort due to inability to lie flat on back. Pt performs bilateral ankle pumps, quad sets, and glutes sets in supine with PT providing verbal cues for correct performance and tactile cuing for improve dmuscle contraction. Supine to sit with supervision and pt performs bilateral LAQs at EOB. Pt strongly requests not to perform sit to stand transfer training // bars due to feeling anxious about falling and feeling that BUEs not strong enough to support self in // bars. Pt Is able to perform multiple reps of sit to stand with EVO walker, however, with minA from PT, while doing a good job of maintaining TDWB precautions with LLE. WC transport back to room. Pt left seated in WC with alarm intact and all needs within reach.  Therapy Documentation Precautions:  Precautions Precautions: Fall, Posterior Hip Precaution Comments: TDWB on LLE for tranfsers only Restrictions Weight Bearing Restrictions: Yes LLE Weight Bearing: Touchdown weight bearing   Therapy/Group: Individual Therapy  Breck Coons, PT, DPT 11/12/2019, 3:30 PM

## 2019-11-13 ENCOUNTER — Other Ambulatory Visit: Payer: Self-pay | Admitting: Cardiology

## 2019-11-13 NOTE — Progress Notes (Addendum)
Patient bled through aquacel surgical foam dressing on L hip. Staples still intact with approximated edges. Site cleansed and new dressing applied.

## 2019-11-13 NOTE — Progress Notes (Signed)
[facility] PHYSICAL MEDICINE & REHABILITATION PROGRESS NOTE   Subjective/Complaints:  Pulmonary sleep rec cardilogy eval for brady noted during sleep study PTA Appreciate [place] result Discussed need for f/u US aorta in 41yrs Still has drainage from left thigh wound, d/w RN  ROS- neg CP SOB, N/V/D  Objective:   US AORTA  Result Date: 11/12/2019 CLINICAL DATA:  69 year old male with abdominal bruit. EXAM: ULTRASOUND OF ABDOMINAL AORTA TECHNIQUE: Ultrasound examination of the abdominal aorta and proximal common iliac arteries was performed to evaluate for aneurysm. Additional color and Doppler images of the distal aorta were obtained to document patency. COMPARISON:  CT abdomen pelvis dated 09/26/2018. FINDINGS: Evaluation is limited due to body habitus and bowel gas. Abdominal aortic measurements as follows: Proximal:  2.8 x 2.9 cm Mid:  1.9 x 2.1 cm Distal:  1.7 x 2.3 cm Patent: Yes, peak systolic velocity is 56 cm/s Right common iliac artery: Not visualized. Left common iliac artery: Not visualized. IMPRESSION: Ectatic aorta measure up to 2.9 cm in diameter. Ectatic abdominal aorta at risk for aneurysm development. Recommend followup by ultrasound in 5 years. This recommendation follows ACR consensus guidelines: White Paper of the ACR Incidental Findings Committee II on Vascular Findings. J Am Coll Radiol 2013; 10:789-794. Aortic aneurysm NOS (ICD10-I71.9) Electronically Signed   By: Anner Crete M.D.   On: 11/12/2019 23:11   VAS [place] LOWER EXTREMITY VENOUS (DVT)  Result Date: 11/11/2019  Lower Venous DVTStudy Indications: Swelling.  Comparison Study: 11/17/18 previous Performing Technologist: Abram Sander RVS  Examination Guidelines: A complete evaluation includes B-mode imaging, spectral Doppler, color Doppler, and power Doppler as needed of all accessible portions of each vessel. Bilateral testing is considered an integral part of a complete examination. Limited examinations for reoccurring  indications may be performed as noted. The reflux portion of the exam is performed with the patient in reverse Trendelenburg.  +---------+---------------+---------+-----------+----------+--------------+ RIGHT    CompressibilityPhasicitySpontaneityPropertiesThrombus Aging +---------+---------------+---------+-----------+----------+--------------+ CFV      Full           Yes      Yes                                 +---------+---------------+---------+-----------+----------+--------------+ SFJ      Full                                                        +---------+---------------+---------+-----------+----------+--------------+ FV Prox  Full                                                        +---------+---------------+---------+-----------+----------+--------------+ FV Mid   Full                                                        +---------+---------------+---------+-----------+----------+--------------+ FV DistalFull                                                        +---------+---------------+---------+-----------+----------+--------------+  PFV      Full                                                        +---------+---------------+---------+-----------+----------+--------------+ POP      Full           Yes      Yes                                 +---------+---------------+---------+-----------+----------+--------------+ PTV      Full                                                        +---------+---------------+---------+-----------+----------+--------------+ PERO     Full                                                        +---------+---------------+---------+-----------+----------+--------------+   +---------+---------------+---------+-----------+----------+--------------+ LEFT     CompressibilityPhasicitySpontaneityPropertiesThrombus Aging  +---------+---------------+---------+-----------+----------+--------------+ CFV      Full           Yes      Yes                                 +---------+---------------+---------+-----------+----------+--------------+ SFJ      Full                                                        +---------+---------------+---------+-----------+----------+--------------+ FV Prox  Full                                                        +---------+---------------+---------+-----------+----------+--------------+ FV Mid   Full                                                        +---------+---------------+---------+-----------+----------+--------------+ FV DistalFull                                                        +---------+---------------+---------+-----------+----------+--------------+ PFV      Full                                                        +---------+---------------+---------+-----------+----------+--------------+  POP      Full           Yes      Yes                                 +---------+---------------+---------+-----------+----------+--------------+ PTV      Full                                                        +---------+---------------+---------+-----------+----------+--------------+ PERO     Full                                                        +---------+---------------+---------+-----------+----------+--------------+     Summary: BILATERAL: - No evidence of deep vein thrombosis seen in the lower extremities, bilaterally. -No evidence of popliteal cyst, bilaterally.   *See table(s) above for measurements and observations. Electronically signed by Monica Martinez MD on 11/11/2019 at 6:30:17 PM.    Final    Recent Labs    11/11/19 0533  WBC 9.8  HGB 7.8*  HCT 25.2*  PLT 107*   Recent Labs    11/11/19 0533  NA 137  K 3.6  CL 101  CO2 29  GLUCOSE 106*  BUN 6*  CREATININE 0.71  CALCIUM 7.9*     Intake/Output Summary (Last 24 hours) at 11/13/2019 1017 Last data filed at 11/13/2019 0753 Gross per 24 hour  Intake 720 ml  Output 1575 ml  Net -855 ml     Physical Exam: Vital Signs Blood pressure 126/68, pulse 80, temperature 98.3 F (36.8 C), resp. rate 18, height 5\' 9"  (1.753 m), weight 78 kg, SpO2 98 %.    General: No acute distress Mood and affect are appropriate Heart: Regular rate and rhythm no rubs murmurs or extra sounds Lungs: Clear to auscultation, breathing unlabored, no rales or wheezes Abdomen: Positive bowel sounds, soft nontender to palpation, nondistended Extremities: No clubbing, cyanosis, or edema Skin: No evidence of breakdown, no evidence of rash   Left thigh wound draining dark serosanguinous fluid, no erythema, no tenderness, could not express additional fluid    Neurologic: Cranial nerves II through XII intact, motor strength is 5/5 in bilateral deltoid, bicep, tricep, grip, RIght hip flexor, knee extensors, ankle dorsiflexor and plantar flexor LLE trace HF, 3/5 KE  Musculoskeletal:Reduce ROM Left hip   Assessment/Plan: 1. Functional deficits secondary to Left hip infection with revision of acetabular component which require 3+ hours per day of interdisciplinary therapy in a comprehensive inpatient rehab setting.  Physiatrist is providing close team supervision and 24 hour management of active medical problems listed below.  Physiatrist and rehab team continue to assess barriers to discharge/monitor patient progress toward functional and medical goals  Care Tool:  Bathing    Body parts bathed by patient: Right arm, Left arm, Chest, Abdomen, Front perineal area, Right upper leg, Left upper leg, Face   Body parts bathed by helper: Right lower leg, Left lower leg Body parts n/a: Buttocks (nursing completed earlier)   Bathing assist Assist Level: Minimal Assistance - Patient > 75%     Upper Body  Dressing/Undressing Upper body dressing  Upper body dressing/undressing activity did not occur (including orthotics): N/A What is the patient wearing?: Pull over shirt    Upper body assist Assist Level: Set up assist    Lower Body Dressing/Undressing Lower body dressing    Lower body dressing activity did not occur: N/A What is the patient wearing?: Pants     Lower body assist Assist for lower body dressing: Moderate Assistance - Patient 50 - 74%     Toileting Toileting Toileting Activity did not occur Landscape architect and hygiene only):  (Urinal)  Toileting assist Assist for toileting: Independent with assistive device Assistive Device Comment: Urinal   Transfers Chair/bed transfer  Transfers assist  Chair/bed transfer activity did not occur: N/A  Chair/bed transfer assist level: Supervision/Verbal cueing (lateral scoot)     Locomotion Ambulation   Ambulation assist   Ambulation activity did not occur: Safety/medical concerns          Walk 10 feet activity   Assist  Walk 10 feet activity did not occur: Safety/medical concerns        Walk 50 feet activity   Assist Walk 50 feet with 2 turns activity did not occur: Safety/medical concerns         Walk 150 feet activity   Assist Walk 150 feet activity did not occur: Safety/medical concerns         Walk 10 feet on uneven surface  activity   Assist Walk 10 feet on uneven surfaces activity did not occur: Safety/medical concerns         Wheelchair     Assist Will patient use wheelchair at discharge?: No (will use electric scooter)             Wheelchair 50 feet with 2 turns activity    Assist            Wheelchair 150 feet activity     Assist          Blood pressure 126/68, pulse 80, temperature 98.3 F (36.8 C), resp. rate 18, height 5\' 9"  (1.753 m), weight 78 kg, SpO2 98 %.  Medical Problem List and Plan: 1.  Decreased functional mobility secondary to failed left hip s/p revision of  acetabular component 11/03/2019 with history of left total hip arthroplasty 10/12/2295 complicated by multiple dislocations and recent fall             -patient may shower but incision must be covered.              -ELOS/Goals: modI in 10-14 days 2.  Antithrombotics: -DVT/anticoagulation: SCDs. Negative LE vascular study             -antiplatelet therapy: Aspirin 81 mg twice daily 3. Pain Management: Lyrica 200 mg twice daily, oxycodone and Robaxin as needed. Pain is currently well controlled.  4. Mood: Lexapro 20 mg daily             -antipsychotic agents: N/A 5. Neuropsych: This patient is capable of making decisions on his own behalf. 6. Skin/Wound Care: Daily inspection of left hip. Incision is currently with bloody drainage, to be expected as per ortho.  7. Fluids/Electrolytes/Nutrition: Routine in and outs with follow-up chemistries poor recorded po fluid intake  8.  Acute blood loss anemia.  Follow-up CBC. 6/29 Hgb 7.7, 7.8 on 7/2 stable  9.  HIV diagnosed 48.  Follow-up infectious disease.  Continue continue antiretroviral medications 10.  ID/enterococcal prosthetic infection left hip.  Continue intravenous ampicillin 2 g  every 4 hours x6 weeks then converting to oral amoxicillin.  Follow-up per infectious disease Dr. Michel Bickers 11.  Hypertension.  Toprol 50 mg daily, Demadex 20 mg daily.  Monitor with increased mobility              HR well controlled. Decrease Torsemide to 10mg  daily.  Vitals:   11/12/19 2036 11/13/19 0417  BP: 119/66 126/68  Pulse: 78 80  Resp: 17 18  Temp: 98.4 F (36.9 C) 98.3 F (36.8 C)  SpO2: 96% 98%  HTN controlled 7/4, no bradycardia 12.  Hyperlipidemia.  Lipitor/Zetia 13.  BPH.  Flomax 0.4 mg daily. 10.  Sleep apnea start nocturnal O2, noted to have bradycardia during OP sleep study , cardiology eval recommended , will check if this needs to be as OP or IP  LOS: 3 days A FACE TO FACE EVALUATION WAS PERFORMED  Charlett Blake 11/13/2019, 10:17  AM

## 2019-11-13 NOTE — Progress Notes (Signed)
Dressing changed this morning moderately saturated oozing. Staples intact.

## 2019-11-13 NOTE — Plan of Care (Signed)
  Problem: RH SKIN INTEGRITY Goal: RH STG MAINTAIN SKIN INTEGRITY WITH ASSISTANCE Description: STG Maintain Skin Integrity With mod I Assistance. Outcome: Not Progressing; bleeding incision site; MD notified new orders noted

## 2019-11-14 ENCOUNTER — Inpatient Hospital Stay (HOSPITAL_COMMUNITY): Payer: Medicare Other | Admitting: Occupational Therapy

## 2019-11-14 ENCOUNTER — Inpatient Hospital Stay (HOSPITAL_COMMUNITY): Payer: Medicare Other | Admitting: Physical Therapy

## 2019-11-14 DIAGNOSIS — S32402S Unspecified fracture of left acetabulum, sequela: Secondary | ICD-10-CM

## 2019-11-14 MED ORDER — SODIUM CHLORIDE 0.9% FLUSH: 10.0000 mL | INTRAVENOUS | Status: DC | PRN

## 2019-11-14 NOTE — Progress Notes (Signed)
[facility] PHYSICAL MEDICINE & REHABILITATION PROGRESS NOTE   Subjective/Complaints: No complaints this morning. Examined left thigh incision- continues to have lot of bloody drainage- dressing soaked. Discussed with RN and changed. As per patient, this amount of drainage has been consistent.   ROS- denies CP SOB, N/V/D  Objective:   US AORTA  Result Date: 11/12/2019 CLINICAL DATA:  69 year old male with abdominal bruit. EXAM: ULTRASOUND OF ABDOMINAL AORTA TECHNIQUE: Ultrasound examination of the abdominal aorta and proximal common iliac arteries was performed to evaluate for aneurysm. Additional color and Doppler images of the distal aorta were obtained to document patency. COMPARISON:  CT abdomen pelvis dated 09/26/2018. FINDINGS: Evaluation is limited due to body habitus and bowel gas. Abdominal aortic measurements as follows: Proximal:  2.8 x 2.9 cm Mid:  1.9 x 2.1 cm Distal:  1.7 x 2.3 cm Patent: Yes, peak systolic velocity is 56 cm/s Right common iliac artery: Not visualized. Left common iliac artery: Not visualized. IMPRESSION: Ectatic aorta measure up to 2.9 cm in diameter. Ectatic abdominal aorta at risk for aneurysm development. Recommend followup by ultrasound in 5 years. This recommendation follows ACR consensus guidelines: White Paper of the ACR Incidental Findings Committee II on Vascular Findings. J Am Coll Radiol 2013; 10:789-794. Aortic aneurysm NOS (ICD10-I71.9) Electronically Signed   By: Anner Crete M.D.   On: 11/12/2019 23:11   No results for input(s): WBC, HGB, HCT, PLT in the last 72 hours. No results for input(s): NA, K, CL, CO2, GLUCOSE, BUN, CREATININE, CALCIUM in the last 72 hours.  Intake/Output Summary (Last 24 hours) at 11/14/2019 1020 Last data filed at 11/14/2019 0954 Gross per 24 hour  Intake 700 ml  Output 3775 ml  Net -3075 ml     Physical Exam: Vital Signs Blood pressure 115/67, pulse 72, temperature 98.4 F (36.9 C), resp. rate 16, height 5\' 9"   (1.753 m), weight 78 kg, SpO2 98 %.   General: Alert and oriented x 3, No apparent distress HEENT: Head is normocephalic, atraumatic, PERRLA, EOMI, sclera anicteric, oral mucosa pink and moist, dentition intact, ext ear canals clear,  Neck: Supple without JVD or lymphadenopathy Heart: Reg rate and rhythm. No murmurs rubs or gallops Chest: CTA bilaterally without wheezes, rales, or rhonchi; no distress Abdomen: Soft, non-tender, non-distended, bowel sounds positive. Extremities: No clubbing, cyanosis, or edema. Pulses are 2+ Skin: Left thigh wound draining dark serosanguinous fluid, no erythema, no tenderness   Neurologic: Cranial nerves II through XII intact, motor strength is 5/5 in bilateral deltoid, bicep, tricep, grip, RIght hip flexor, knee extensors, ankle dorsiflexor and plantar flexor LLE trace HF, 3/5 KE Musculoskeletal:Reduce ROM Left hip   Assessment/Plan: 1. Functional deficits secondary to Left hip infection with revision of acetabular component which require 3+ hours per day of interdisciplinary therapy in a comprehensive inpatient rehab setting.  Physiatrist is providing close team supervision and 24 hour management of active medical problems listed below.  Physiatrist and rehab team continue to assess barriers to discharge/monitor patient progress toward functional and medical goals  Care Tool:  Bathing    Body parts bathed by patient: Right arm, Left arm, Chest, Abdomen, Front perineal area, Right upper leg, Left upper leg, Face   Body parts bathed by helper: Right lower leg, Left lower leg Body parts n/a: Buttocks (nursing completed earlier)   Bathing assist Assist Level: Minimal Assistance - Patient > 75%     Upper Body Dressing/Undressing Upper body dressing Upper body dressing/undressing activity did not occur (including orthotics): N/A  What is the patient wearing?: Pull over shirt    Upper body assist Assist Level: Set up assist    Lower Body  Dressing/Undressing Lower body dressing    Lower body dressing activity did not occur: N/A What is the patient wearing?: Pants     Lower body assist Assist for lower body dressing: Moderate Assistance - Patient 50 - 74%     Toileting Toileting Toileting Activity did not occur Landscape architect and hygiene only):  (Urinal)  Toileting assist Assist for toileting: Independent with assistive device Assistive Device Comment: Urinal   Transfers Chair/bed transfer  Transfers assist  Chair/bed transfer activity did not occur: N/A  Chair/bed transfer assist level: Supervision/Verbal cueing (lateral scoot)     Locomotion Ambulation   Ambulation assist   Ambulation activity did not occur: Safety/medical concerns          Walk 10 feet activity   Assist  Walk 10 feet activity did not occur: Safety/medical concerns        Walk 50 feet activity   Assist Walk 50 feet with 2 turns activity did not occur: Safety/medical concerns         Walk 150 feet activity   Assist Walk 150 feet activity did not occur: Safety/medical concerns         Walk 10 feet on uneven surface  activity   Assist Walk 10 feet on uneven surfaces activity did not occur: Safety/medical concerns         Wheelchair     Assist Will patient use wheelchair at discharge?: No (will use electric scooter)             Wheelchair 50 feet with 2 turns activity    Assist            Wheelchair 150 feet activity     Assist          Blood pressure 115/67, pulse 72, temperature 98.4 F (36.9 C), resp. rate 16, height 5\' 9"  (1.753 m), weight 78 kg, SpO2 98 %.  Medical Problem List and Plan: 1.  Decreased functional mobility secondary to failed left hip s/p revision of acetabular component 11/03/2019 with history of left total hip arthroplasty 06/21/4763 complicated by multiple dislocations and recent fall             -patient may shower but incision must be covered.               -ELOS/Goals: modI in 10-14 days  -Continue CIR 2.  Antithrombotics: -DVT/anticoagulation: SCDs. Negative LE vascular study             -antiplatelet therapy: Aspirin 81 mg twice daily 3. Pain Management: Lyrica 200 mg twice daily, oxycodone and Robaxin as needed. Well controlled.   4. Mood: Lexapro 20 mg daily             -antipsychotic agents: N/A 5. Neuropsych: This patient is capable of making decisions on his own behalf. 6. Skin/Wound Care: Daily inspection of left hip. Incision is currently with bloody drainage, to be expected as per ortho.   7/5: Continues to have significant drainage 7. Fluids/Electrolytes/Nutrition: Routine in and outs with follow-up chemistries poor recorded po fluid intake  8.  Acute blood loss anemia.  Follow-up CBC. 6/29 Hgb 7.7, 7.8 on 7/2 stable  9.  HIV diagnosed 45.  Follow-up infectious disease.  Continue continue antiretroviral medications 10.  ID/enterococcal prosthetic infection left hip.  Continue intravenous ampicillin 2 g every 4 hours x6 weeks then  converting to oral amoxicillin.  Follow-up per infectious disease Dr. Michel Bickers 11.  Hypertension.  Toprol 50 mg daily, Demadex 20 mg daily.  Monitor with increased mobility              HR well controlled. Decrease Torsemide to 10mg  daily.  Vitals:   11/13/19 1920 11/14/19 0405  BP: 107/65 115/67  Pulse: 76 72  Resp:  16  Temp: 98.7 F (37.1 C) 98.4 F (36.9 C)  SpO2: 94% 98%  7/5: well controlled. Can consider decreasing Torsemide further.  12.  Hyperlipidemia.  Lipitor/Zetia 13.  BPH.  Flomax 0.4 mg daily. 10.  Sleep apnea start nocturnal O2, noted to have bradycardia during OP sleep study , cardiology eval recommended , will check if this needs to be as OP or IP  LOS: 4 days A FACE TO FACE EVALUATION WAS PERFORMED  Kaliann Coryell P Konni Kesinger 11/14/2019, 10:20 AM

## 2019-11-14 NOTE — Progress Notes (Signed)
Occupational Therapy Session Note  Patient Details  Name: [name] MRN: [id number] Date of Birth: May 07, 1951  Today's Date: 11/14/2019 OT Individual Time: 0903-1003 OT Individual Time Calculation (min): 60 min    Short Term Goals: Week 1:  OT Short Term Goal 1 (Week 1): STGs equal LTGs based on ELOS  Skilled Therapeutic Interventions/Progress Updates:    Session 1: (0903-1003)  Pt in bed to start session.  He was able to transfer to the EOB with supervision and the work on bathing tasks from that position.  He completed all UB bathing and dressing with overall supervision.  AE was utilized for removal of gripper socks as well as for washing his left lower leg and foot.  He was able to complete both tasks with setup assistance.  He was able to donn his shorts over his feet without use of the reacher and complete lateral leans side to side for pulling the garments up over his hips. He used the sockaide with setup for donning his gripper socks as well after therapist assisted with TEDs.  Had pt practice with the shoe funnel and reacher to donn his shoes.  He needed min assist to complete on the left,  He reports that at home he will likely not be wearing the shoes, but instead the gripper socks most of the time.  Next, he was able to complete a squat pivot transfer over to the wheelchair and therapist took him down to the ADL apartment where he completed simulated shower transfers with use of the tub bench with close supervision.  He has a hand held shower at home already at this time, and not further equipment will be needed for the shower.  He will discuss with his daughter about the drop arm commode and decide soon.  Returned to the wheelchair and then back to the room to complete session.  He was able to transfer back to the bed at supervision level scoot pivot with call button and phone in reach.    Session 2: (1304-1406)  No significant pain reported during session.  He was able to transfer  from supine to sit EOB with supervision.  He then completed scoot pivot transfer over to the wheelchair at supervision.  He reported needed to toilet, so had him transfer scoot pivot to the drop arm commode next with supervision as well.  He opted to complete clothing management in the wheelchair with lateral leans side to side prior to transfer.  At home he reports that he will likely wear gowns most of the time to make this easier.  He was able to complete toilet hygiene in sitting with lateral lean to the left and supervision.  Next, he worked on Probation officer sitting on the drop arm commode, with overall min assist to pull them up in the back.  Had him transfer back to the wheelchair at the same level and then wash his hands at the sink with setup assist.  Next, he was taken to the dayroom where he completed BUE strengthening using the medium resistance orange therapy band.  He was able to perform 2 sets of 15 reps for shoulder row with min instructional cueing for technique.  He was able to complete 2 sets of 10 reps for elbow flexion and 1 set of 10 reps with mod assist for external rotation bilaterally.  Pt with history of rotator cuff injury in need of surgery currently, but has not been able to have them completed.  Finished  session with return to the room and pt completing scoot pivot transfer to the bed to finish session.  Pt left in bed with call button and phone in reach.    Therapy Documentation Precautions:  Precautions Precautions: Fall, Posterior Hip Precaution Comments: TDWB on LLE for tranfsers only Restrictions Weight Bearing Restrictions: Yes LLE Weight Bearing: Touchdown weight bearing   Pain: Pain Assessment Pain Scale: Faces Faces Pain Scale: Hurts a little bit Pain Type: Acute pain Pain Location: Leg Pain Orientation: Left Pain Descriptors / Indicators: Discomfort Pain Onset: With Activity Pain Intervention(s): Repositioned;Emotional support ADL: See Care Tool  Section for some details of mobility and selfcare  Therapy/Group: Individual Therapy  Tagen Brethauer OTR/L 11/14/2019, 12:51 PM

## 2019-11-14 NOTE — Progress Notes (Signed)
Physical Therapy Session Note  Patient Details  Name: [name] MRN: [id number] Date of Birth: 04-04-1951  Today's Date: 11/14/2019 PT Individual Time: 1100-1150 PT Individual Time Calculation (min): 50 min   Short Term Goals: Week 1:  PT Short Term Goal 1 (Week 1): = to LTGs based on ELOS  Skilled Therapeutic Interventions/Progress Updates:  Pt presented in bed agreeable to therapy. Pt denies pain during session. Pt indicated feel that he has met personal goals and is ready for d/c as has figured out transfers and household mobility concerns. Pt demonstrated supine to sit with supervision and use of bed features. Pt performed lateral scoot transfer to w/c with close S. Pt transported to nsg station due to pt's IV obstructed and nurse currently managing phones. Pt transported back to room and performed lateral scoot transfer back to bed. Pt participated in LE therex as follows: ankle pumps, QS, GS, SAQ x 20 bilaterally. Pt also performed AA heel slides x 10 on LLE with pt stating he does this frequently to prevent "stiffness" pt states does not perform hip abd however was able to tolerate hip abd isometrics x 10 without pain or "pulling" sensation. Pt also performed pillow squeezes x 20 bilaterally. Pt repositioned to comfort and left in bed with alarm on, call bell within reach and needs met.      Therapy Documentation Precautions:  Precautions Precautions: Fall, Posterior Hip Precaution Comments: TDWB on LLE for tranfsers only Restrictions Weight Bearing Restrictions: Yes LLE Weight Bearing: Touchdown weight bearing General: PT Amount of Missed Time (min): 10 Minutes PT Missed Treatment Reason: Other (Comment) Vital Signs:   Pain: Pain Assessment Pain Scale: Faces Faces Pain Scale: Hurts a little bit Pain Type: Acute pain Pain Location: Leg Pain Orientation: Left Pain Descriptors / Indicators: Discomfort Pain Onset: With Activity Pain Intervention(s): Repositioned;Emotional  support    Therapy/Group: Individual Therapy  Anniah Glick  Kamilya Wakeman, PTA  11/14/2019, 1:01 PM

## 2019-11-15 ENCOUNTER — Telehealth: Payer: Self-pay | Admitting: Radiology

## 2019-11-15 ENCOUNTER — Inpatient Hospital Stay (HOSPITAL_COMMUNITY): Payer: Medicare Other | Admitting: Occupational Therapy

## 2019-11-15 ENCOUNTER — Inpatient Hospital Stay (HOSPITAL_COMMUNITY): Payer: Medicare Other | Admitting: Physical Therapy

## 2019-11-15 ENCOUNTER — Encounter (HOSPITAL_COMMUNITY): Payer: Medicare Other

## 2019-11-15 DIAGNOSIS — M009 Pyogenic arthritis, unspecified: Secondary | ICD-10-CM

## 2019-11-15 MED ORDER — AMPICILLIN IV (FOR PTA / DISCHARGE USE ONLY)
12.0000 g | INTRAVENOUS | 0 refills | Status: AC
Start: 2019-11-15 — End: 2019-12-20

## 2019-11-15 MED ORDER — METOPROLOL SUCCINATE ER 50 MG PO TB24: 50.0000 mg | ORAL_TABLET | Freq: Every day | ORAL | 0 refills | Status: DC

## 2019-11-15 MED ORDER — ATORVASTATIN CALCIUM 10 MG PO TABS: 10.0000 mg | ORAL_TABLET | Freq: Every day | ORAL | 9 refills | Status: AC

## 2019-11-15 MED ORDER — TORSEMIDE 10 MG PO TABS: 10.0000 mg | ORAL_TABLET | Freq: Every day | ORAL | 0 refills | Status: DC

## 2019-11-15 MED ORDER — DOCUSATE SODIUM 100 MG PO CAPS: 100.0000 mg | ORAL_CAPSULE | Freq: Two times a day (BID) | ORAL | 0 refills | Status: DC

## 2019-11-15 MED ORDER — SODIUM CHLORIDE 0.9 % IV SOLN: 2.0000 g | INTRAVENOUS | 1 refills | Status: DC

## 2019-11-15 MED ORDER — OXYCODONE HCL 5 MG PO TABS: 5.0000 mg | ORAL_TABLET | ORAL | 0 refills | Status: DC | PRN

## 2019-11-15 MED ORDER — METHOCARBAMOL 500 MG PO TABS: 500.0000 mg | ORAL_TABLET | Freq: Four times a day (QID) | ORAL | 0 refills | Status: DC | PRN

## 2019-11-15 MED ORDER — TAMSULOSIN HCL 0.4 MG PO CAPS: 0.4000 mg | ORAL_CAPSULE | Freq: Every day | ORAL | 1 refills | Status: DC

## 2019-11-15 MED ORDER — SULFAMETHOXAZOLE-TRIMETHOPRIM 800-160 MG PO TABS
1.0000 | ORAL_TABLET | Freq: Every day | ORAL | Status: DC
  Administered 2019-11-16: 1 via ORAL
  Filled 2019-11-15 (×2): qty 1

## 2019-11-15 MED ORDER — PANTOPRAZOLE SODIUM 40 MG PO TBEC: 40.0000 mg | DELAYED_RELEASE_TABLET | Freq: Every day | ORAL | 0 refills | Status: DC

## 2019-11-15 NOTE — Progress Notes (Signed)
PHARMACY CONSULT NOTE FOR:  OUTPATIENT  PARENTERAL ANTIBIOTIC THERAPY (OPAT)  Indication: Hip infection Regimen: ampicillin 12g daily via continuous infusion pump End date: 12/20/19  IV antibiotic discharge orders are pended. To discharging provider:  please sign these orders via discharge navigator,  Select New Orders & click on the button choice - Manage This Unsigned Work.     Thank you for allowing pharmacy to be a part of this patient's care.  Benetta Spar, PharmD, BCPS, BCCP Clinical Pharmacist  Please check AMION for all [place] phone numbers After 10:00 PM, call [place] [phone]

## 2019-11-15 NOTE — Care Management (Signed)
Patient ID: [name], male   DOB: 1951-03-29, 69 y.o.   MRN: [id number]  Met with patient to review role of CM in collaboration with SW Margreta Journey) to facilitate preparation for discharge. Anticipate discharge Thursday however patient would like to go home Wednesday if all can be worked out for education with his daughter and set up of IV abx/home oxygen at night. Reported daughter had performed dressing changes to hip in the past so she is familiar with the process and will need dressing changes several times a day due to continued drainage from incision. (expected post-op per ortho). Denies pain/discomfort although not able to weight bear on that hip and expects some pain once he is able to move around more. No other nursing issues noted at present. SW working on coordinating follow up at discharge.

## 2019-11-15 NOTE — Progress Notes (Signed)
Patient ID: [name], male   DOB: 1950-11-29, 69 y.o.   MRN: [id number]  Home 02 setup ordered through [employer]

## 2019-11-15 NOTE — Progress Notes (Signed)
Physical Therapy Session Note  Patient Details  Name: [name] MRN: [id number] Date of Birth: 03/22/1951  Today's Date: 11/15/2019 PT Individual Time: 0850-1002 PT Individual Time Calculation (min): 72 min   Short Term Goals: Week 1:  PT Short Term Goal 1 (Week 1): = to LTGs based on ELOS  Skilled Therapeutic Interventions/Progress Updates:    Pt received sing R EOB, HOB flat and not using bedrails, mod-I. L lateral scoot transfer to wupine in bed and agreeable to therapy session. Pt eager to go home. Pt called his daughter - discussed with pt and daughter about option of having her come in for education/training and she declines reporting she has assisted him in the past and feels confident to do it now. Supine>sitt/c distant supervision.  Transported to/from gym in w/c for time management and energy conservation. Simulated car transfer (small SUV height) via lateral scoot to/from w/c with supervision. Lateral scoot transfer w/c<>EOM distant supervision.  Performed the following exercises with L LE to create HEP: - glut sets 5 second hold x15 reps - long arc quads x15 reps - short arc quads x15 reps - heel slides ensuring hip flexion <90degrees with active assist from his R LE x15 reps Therapist provided printed HEP for reference. Pt requesting to go outside for change of scenery for emotional support. Transported in/out in w/c. While outside discussed discharge planning with pt ensuring he maintain hip precautions with mobility tasks due to history of multiple dislocations. Transported back to room. R lateral scoot to EOB with distant supervision. Pt left sitting on EOB with needs in reach and bed alarm on.  Therapy Documentation Precautions:  Precautions Precautions: Fall, Posterior Hip Precaution Comments: TDWB on LLE for tranfsers only Restrictions Weight Bearing Restrictions: Yes LLE Weight Bearing: Touchdown weight bearing  Pain: No reports of pain throughout  session.  Therapy/Group: Individual Therapy  Tawana Scale, PT, DPT, CSRS  11/15/2019, 7:47 AM

## 2019-11-15 NOTE — Progress Notes (Addendum)
[facility] PHYSICAL MEDICINE & REHABILITATION PROGRESS NOTE   Subjective/Complaints:    No issues overnite discussed need for IV abx until 8/10.  Daughter will be assisting pt at home , has not been in for training   ROS- denies CP SOB, N/V/D  Objective:   No results found. No results for input(s): WBC, HGB, HCT, PLT in the last 72 hours. No results for input(s): NA, K, CL, CO2, GLUCOSE, BUN, CREATININE, CALCIUM in the last 72 hours.  Intake/Output Summary (Last 24 hours) at 11/15/2019 1607 Last data filed at 11/15/2019 0415 Gross per 24 hour  Intake 940 ml  Output 2650 ml  Net -1710 ml     Physical Exam: Vital Signs Blood pressure 113/69, pulse 70, temperature 98.1 F (36.7 C), resp. rate 16, height 5\' 9"  (1.753 m), weight 78 kg, SpO2 97 %.  General: No acute distress Mood and affect are appropriate Heart: Regular rate and rhythm no rubs murmurs or extra sounds Lungs: Clear to auscultation, breathing unlabored, no rales or wheezes Abdomen: Positive bowel sounds, soft nontender to palpation, nondistended Extremities: No clubbing, cyanosis, or edema Skin: No evidence of breakdown, no evidence of rash  Skin: Left thigh wound draining dark serosanguinous fluid, no erythema, no tenderness , top image today , bottom image 4-5 days  ago    7/6     Neurologic: Cranial nerves II through XII intact, motor strength is 5/5 in bilateral deltoid, bicep, tricep, grip, RIght hip flexor, knee extensors, ankle dorsiflexor and plantar flexor LLE trace HF, 3/5 KE Musculoskeletal:Reduce ROM Left hip   Assessment/Plan: 1. Functional deficits secondary to Left hip infection with revision of acetabular component which require 3+ hours per day of interdisciplinary therapy in a comprehensive inpatient rehab setting.  Physiatrist is providing close team supervision and 24 hour management of active medical problems listed below.  Physiatrist and rehab team continue to assess barriers to  discharge/monitor patient progress toward functional and medical goals  Care Tool:  Bathing    Body parts bathed by patient: Right arm, Left arm, Chest, Abdomen, Front perineal area, Right upper leg, Left upper leg, Face, Right lower leg, Left lower leg   Body parts bathed by helper: Right lower leg, Left lower leg Body parts n/a: Buttocks (nursing completed earlier)   Bathing assist Assist Level: Supervision/Verbal cueing (sitting with lateral leans)     Upper Body Dressing/Undressing Upper body dressing   What is the patient wearing?: Pull over shirt    Upper body assist Assist Level: Set up assist    Lower Body Dressing/Undressing Lower body dressing      What is the patient wearing?: Pants     Lower body assist Assist for lower body dressing: Supervision/Verbal cueing (with AE use)     Toileting Toileting Toileting Activity did not occur (Clothing management and hygiene only):  (Urinal)  Toileting assist Assist for toileting: Minimal Assistance - Patient > 75% Assistive Device Comment: Urinal   Transfers Chair/bed transfer  Transfers assist     Chair/bed transfer assist level: Supervision/Verbal cueing (scooting transfer)     Locomotion Ambulation   Ambulation assist   Ambulation activity did not occur: Safety/medical concerns          Walk 10 feet activity   Assist  Walk 10 feet activity did not occur: Safety/medical concerns        Walk 50 feet activity   Assist Walk 50 feet with 2 turns activity did not occur: Safety/medical concerns  Walk 150 feet activity   Assist Walk 150 feet activity did not occur: Safety/medical concerns         Walk 10 feet on uneven surface  activity   Assist Walk 10 feet on uneven surfaces activity did not occur: Safety/medical concerns         Wheelchair     Assist Will patient use wheelchair at discharge?: No (will use electric scooter)             Wheelchair 50 feet  with 2 turns activity    Assist            Wheelchair 150 feet activity     Assist          Blood pressure 113/69, pulse 70, temperature 98.1 F (36.7 C), resp. rate 16, height 5\' 9"  (1.753 m), weight 78 kg, SpO2 97 %.  Medical Problem List and Plan: 1.  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              -ELOS/Goals: modI goals per PT at goal, will see if HH IV abx can be set up tomorrow vs THurs  -Continue CIR 2.  Antithrombotics: -DVT/anticoagulation: SCDs. Negative LE vascular study             -antiplatelet therapy: Aspirin 81 mg twice daily 3. Pain Management: Lyrica 200 mg twice daily, oxycodone and Robaxin as needed. Well controlled.   4. Mood: Lexapro 20 mg daily             -antipsychotic agents: N/A 5. Neuropsych: This patient is capable of making decisions on his own behalf. 6. Skin/Wound Care: Daily inspection of left hip. Incision is currently with bloody drainage, to be expected as per ortho.   7/5: Continues to have significant drainage 7. Fluids/Electrolytes/Nutrition: Routine in and outs with follow-up chemistries poor recorded po fluid intake  8.  Acute blood loss anemia.  Follow-up CBC. 6/29 Hgb 7.7, 7.8 on 7/2 stable  9.  HIV diagnosed 26.  Follow-up infectious disease.  Continue continue antiretroviral medications 10.  ID/enterococcal prosthetic infection left hip.  Continue intravenous ampicillin 2 g every 4 hours x6 weeks then converting to oral amoxicillin.  Follow-up per infectious disease Dr. Michel Bickers 11.  Hypertension.  Toprol 50 mg daily, Demadex 20 mg daily.  Monitor with increased mobility              HR well controlled. Decrease Torsemide to 10mg  daily.  Vitals:   11/14/19 1946 11/15/19 0419  BP:  117/66 113/69  Pulse: 80 70  Resp:  16  Temp: 98.8 F (37.1 C) 98.1 F (36.7 C)  SpO2: 94% 97%  7/6: well controlled. Can consider decreasing Torsemide further.  12.  Hyperlipidemia.  Lipitor/Zetia 13.  BPH.  Flomax 0.4 mg daily. 10.  Sleep apnea start nocturnal O2, noted to have bradycardia during OP sleep study , cardiology eval recommended , will check if this needs to be as OP or IP  LOS: 5 days A FACE TO FACE EVALUATION WAS PERFORMED  Charlett Blake 11/15/2019, 7:12 AM

## 2019-11-15 NOTE — Plan of Care (Signed)
  Problem: RH SAFETY Goal: RH STG ADHERE TO SAFETY PRECAUTIONS W/ASSISTANCE/DEVICE Description: STG Adhere to Safety Precautions With cues and reminders.  Outcome: Progressing Goal: RH STG DECREASED RISK OF FALL WITH ASSISTANCE Description: STG Decreased Risk of Fall With cues and reminders.  Outcome: Progressing   Problem: RH KNOWLEDGE DEFICIT GENERAL Goal: RH STG INCREASE KNOWLEDGE OF SELF CARE AFTER HOSPITALIZATION Description: Pt will be able to demonstrate understanding of proper precautions and weight bearing restrictions to take post hip replacement to prevent further dislocation and possible complications independently using handouts. Pt will be able to demonstrate understanding of pain management using pharmacologic and nonpharmacologic interventions independently.   Outcome: Progressing

## 2019-11-15 NOTE — Discharge Summary (Signed)
Physician Discharge Summary  Patient ID: [name] MRN: [id number] DOB/AGE: 1950-09-16 69 y.o.  Admit date: 11/10/2019 Discharge date: 11/16/2019  Discharge Diagnoses:  Principal Problem:   Septic arthritis of pelvic region and thigh region [facility]) Active Problems:   Left acetabular fracture ([place]) DVT prophylaxis Mood stabilization Acute blood loss anemia HIV diagnosed 1993 Enterococcal prosthetic infection left hip Hyperlipidemia BPH  Discharged Condition: Stable  Significant Diagnostic Studies: CT Chest High Resolution  Result Date: 10/19/2019 CLINICAL DATA:  Shortness of breath, pulmonary fibrosis. EXAM: CT CHEST WITHOUT CONTRAST TECHNIQUE: Multidetector CT imaging of the chest was performed following the standard protocol without intravenous contrast. High resolution imaging of the lungs, as well as inspiratory and expiratory imaging, was performed. COMPARISON:  11/17/2018 and 01/24/2016. FINDINGS: Cardiovascular: Atherosclerotic calcification of the aorta and aortic valve. Heart size normal. No pericardial effusion. Mediastinum/Nodes: Calcified mediastinal and hilar lymph nodes. No pathologically enlarged mediastinal or axillary lymph nodes. Hilar regions are otherwise difficult to definitively evaluate without IV contrast. Esophagus is grossly unremarkable. Lungs/Pleura: Basilar predominant subpleural reticulation, ground-glass and traction bronchiolectasis. Findings appear more organized/progressive from prior exams. Small left fibrothorax. Rounded atelectasis and scarring in the left lower lobe. No air trapping. Upper Abdomen: Visualized portion of the liver is unremarkable. Cholecystectomy. Visualized portions of the adrenal glands, right kidney, spleen, pancreas, stomach and bowel are unremarkable with the exception of a small hiatal hernia. Elongated peripherally calcified structure between the spleen and stomach is unchanged and has a benign appearance. Musculoskeletal:  Postoperative and degenerative changes in the spine. Median sternotomy. Degenerative changes in the shoulders. No worrisome lytic or sclerotic lesions. IMPRESSION: 1. Basilar predominant pattern of subpleural fibrosis appears more organized and progressive from prior exams and may be due to usual interstitial pneumonitis or fibrotic nonspecific interstitial pneumonitis. Findings are indeterminate for UIP per consensus guidelines: Diagnosis of Idiopathic Pulmonary Fibrosis: An Official ATS/ERS/JRS/ALAT Clinical Practice Guideline. [place], Iss 5, [phone], Jan 10 2017. 2. Small left fibrothorax with adjacent scarring and volume loss in the left lower lobe. 3.  Aortic atherosclerosis (ICD10-I70.0). Electronically Signed   By: Lorin Picket M.D.   On: 10/19/2019 12:34   DG Pelvis Portable  Result Date: 11/03/2019 CLINICAL DATA:  Post revision of total hip replacement EXAM: PORTABLE PELVIS 1-2 VIEWS COMPARISON:  Portable exam 1131 hours compared to intraoperative images of 11/03/2019 FINDINGS: LEFT hip prosthesis identified. Bones demineralized. No acute fracture or dislocation. High position of the LEFT acetabulum. Degenerative changes of the RIGHT hip joint noted. IMPRESSION: Post LEFT hip arthroplasty. No acute abnormalities. Electronically Signed   By: Lavonia Dana M.D.   On: 11/03/2019 13:22   US AORTA  Result Date: 11/12/2019 CLINICAL DATA:  69 year old male with abdominal bruit. EXAM: ULTRASOUND OF ABDOMINAL AORTA TECHNIQUE: Ultrasound examination of the abdominal aorta and proximal common iliac arteries was performed to evaluate for aneurysm. Additional color and Doppler images of the distal aorta were obtained to document patency. COMPARISON:  CT abdomen pelvis dated 09/26/2018. FINDINGS: Evaluation is limited due to body habitus and bowel gas. Abdominal aortic measurements as follows: Proximal:  2.8 x 2.9 cm Mid:  1.9 x 2.1 cm Distal:  1.7 x 2.3 cm Patent: Yes, peak systolic  velocity is 56 cm/s Right common iliac artery: Not visualized. Left common iliac artery: Not visualized. IMPRESSION: Ectatic aorta measure up to 2.9 cm in diameter. Ectatic abdominal aorta at risk for aneurysm development. Recommend followup by ultrasound in 5 years. This recommendation follows  ACR consensus guidelines: White Paper of the ACR Incidental Findings Committee II on Vascular Findings. J Am Coll Radiol 2013; 10:789-794. Aortic aneurysm NOS (ICD10-I71.9) Electronically Signed   By: Anner Crete M.D.   On: 11/12/2019 23:11   DG CHEST PORT 1 VIEW  Result Date: 11/07/2019 CLINICAL DATA:  Chest pain at rest EXAM: PORTABLE CHEST 1 VIEW COMPARISON:  CT 10/18/2019, chest x-ray 08/13/2019 FINDINGS: Post sternotomy changes. Mild basilar fibrosis. Small left effusion or pleural thickening. Enlarged cardiomediastinal silhouette with aortic atherosclerosis. No pneumothorax. IMPRESSION: Cardiomegaly. Mild basilar fibrosis. Small left pleural effusion or pleural thickening. Electronically Signed   By: Donavan Foil M.D.   On: 11/07/2019 20:18   DG C-Arm 1-60 Min  Result Date: 11/03/2019 CLINICAL DATA:  Left hip surgery. EXAM: DG C-ARM 1-60 MIN CONTRAST:  No prior. FLUOROSCOPY TIME:  Fluoroscopy Time: Radiation Exposure Index (if provided by the fluoroscopic device): 4 point 1 mGy COMPARISON:  10/06/2019. CT left hip 08/12/2019. FINDINGS: Total left hip replacement. Hardware intact. Anatomic alignment. True 0 acetabuli noted. No acute abnormality identified. IMPRESSION: Total left hip replacement. Electronically Signed   By: Marcello Moores  Register   On: 11/03/2019 10:46   DG HIP OPERATIVE UNILAT W OR W/O PELVIS LEFT  Result Date: 11/03/2019 CLINICAL DATA:  Left hip surgery. EXAM: OPERATIVE LEFT HIP (WITH PELVIS IF PERFORMED)  VIEWS TECHNIQUE: Fluoroscopic spot image(s) were submitted for interpretation post-operatively. COMPARISON:  Left hip series 10/06/2019 CT 08/12/2019. FINDINGS: Total left hip replacement.  Hardware intact. Stable alignment. Stable protrusio acetabuli. No acute bony abnormality. IMPRESSION: Total left hip replacement with stable alignment. Electronically Signed   By: Marcello Moores  Register   On: 11/03/2019 10:40   VAS [place] LOWER EXTREMITY VENOUS (DVT)  Result Date: 11/11/2019  Lower Venous DVTStudy Indications: Swelling.  Comparison Study: 11/17/18 previous Performing Technologist: Abram Sander RVS  Examination Guidelines: A complete evaluation includes B-mode imaging, spectral Doppler, color Doppler, and power Doppler as needed of all accessible portions of each vessel. Bilateral testing is considered an integral part of a complete examination. Limited examinations for reoccurring indications may be performed as noted. The reflux portion of the exam is performed with the patient in reverse Trendelenburg.  +---------+---------------+---------+-----------+----------+--------------+ RIGHT    CompressibilityPhasicitySpontaneityPropertiesThrombus Aging +---------+---------------+---------+-----------+----------+--------------+ CFV      Full           Yes      Yes                                 +---------+---------------+---------+-----------+----------+--------------+ SFJ      Full                                                        +---------+---------------+---------+-----------+----------+--------------+ FV Prox  Full                                                        +---------+---------------+---------+-----------+----------+--------------+ FV Mid   Full                                                        +---------+---------------+---------+-----------+----------+--------------+  FV DistalFull                                                        +---------+---------------+---------+-----------+----------+--------------+ PFV      Full                                                         +---------+---------------+---------+-----------+----------+--------------+ POP      Full           Yes      Yes                                 +---------+---------------+---------+-----------+----------+--------------+ PTV      Full                                                        +---------+---------------+---------+-----------+----------+--------------+ PERO     Full                                                        +---------+---------------+---------+-----------+----------+--------------+   +---------+---------------+---------+-----------+----------+--------------+ LEFT     CompressibilityPhasicitySpontaneityPropertiesThrombus Aging +---------+---------------+---------+-----------+----------+--------------+ CFV      Full           Yes      Yes                                 +---------+---------------+---------+-----------+----------+--------------+ SFJ      Full                                                        +---------+---------------+---------+-----------+----------+--------------+ FV Prox  Full                                                        +---------+---------------+---------+-----------+----------+--------------+ FV Mid   Full                                                        +---------+---------------+---------+-----------+----------+--------------+ FV DistalFull                                                        +---------+---------------+---------+-----------+----------+--------------+  PFV      Full                                                        +---------+---------------+---------+-----------+----------+--------------+ POP      Full           Yes      Yes                                 +---------+---------------+---------+-----------+----------+--------------+ PTV      Full                                                         +---------+---------------+---------+-----------+----------+--------------+ PERO     Full                                                        +---------+---------------+---------+-----------+----------+--------------+     Summary: BILATERAL: - No evidence of deep vein thrombosis seen in the lower extremities, bilaterally. -No evidence of popliteal cyst, bilaterally.   *See table(s) above for measurements and observations. Electronically signed by Monica Martinez MD on 11/11/2019 at 6:30:17 PM.    Final    [place] EKG SITE RITE  Result Date: 11/08/2019 If Site Rite image not attached, placement could not be confirmed due to current cardiac rhythm.   Labs:  Basic Metabolic Panel: Recent Labs  Lab 11/11/19 0533  NA 137  K 3.6  CL 101  CO2 29  GLUCOSE 106*  BUN 6*  CREATININE 0.71  CALCIUM 7.9*    CBC: Recent Labs  Lab 11/11/19 0533  WBC 9.8  NEUTROABS 2.0  HGB 7.8*  HCT 25.2*  MCV 114.5*  PLT 107*    CBG: No results for input(s): GLUCAP in the last 168 hours.  Family history.  Mother with breast cancer hypertension hyperlipidemia and diabetes.  Father with prostate cancer hyperlipidemia.  Denies any colon cancer esophageal cancer or rectal cancer  Brief HPI:   [name] is a 68 y.o. right-handed male with history of chronic back pain, CLL not on therapy, CAD with CABG 2010, hypertension, HIV infection diagnosed 1993, TIA, lateral knee surgery, patient underwent left total hip arthroplasty April of last year postoperatively he had instability and recurrent dislocation requiring several other surgeries through June of last year and in late October he fell sustaining a periprosthetic fracture around the femoral stem.  A CT scan in December showed some lucency around some of the acetabular screws.  Per chart review lives alone independent with assistive device.  Presented 11/03/2019 after mechanical fall without loss of conscious.  X-rays and imaging revealed progressive  loosening of left hip acetabular component.  The acetabular component had become more medialized and more vertical after fall.  There was noted fracture lines in the pelvis on CT scan.  Patient underwent revision left hip acetabular component 11/03/2019 per Dr. Ninfa Linden.  Touchdown weightbearing left lower extremity.  Maintain on  aspirin twice daily for DVT prophylaxis.  Acute blood loss anemia 7.7.  Wound culture showed Enterococcus with infectious disease consulted Dr. Michel Bickers placed on intravenous vancomycin initially and transitioned to intravenous ampicillin that he will continue on for 6 weeks through 12/20/2019 converting to oral amoxicillin.  Patient was admitted for a comprehensive rehab program.   Hospital Course: [name] was admitted to rehab 11/10/2019 for inpatient therapies to consist of PT, ST and OT at least three hours five days a week. Past admission physiatrist, therapy team and rehab RN have worked together to provide customized collaborative inpatient rehab.  Pertaining to patient's revision acetabular component 11/03/2019 with history of left total hip arthroplasty 05/17/1094 complicated by multiple dislocations and recent fall.  Patient would follow-up with orthopedic service.  Weightbearing precautions as directed.  SCDs for DVT prophylaxis EPC on low-dose aspirin therapy venous Doppler studies negative.  Pain managed with use of Lyrica schedule as well as oxycodone and Robaxin as needed.  Mood stabilization with Lexapro.  Patient with enterococcal prosthetic infection of left hip followed by infectious disease he will continue on ampicillin through 12/20/2019 converting to oral amoxicillin at the discretion of infectious disease.  He remained afebrile.  Acute blood loss anemia he remained asymptomatic.  Blood pressure controlled on Toprol as well as Demadex.  He continued on Lipitor for hyperlipidemia.  Flomax for BPH voiding without difficulty.  He did have a history of HIV  diagnosed in 1993 he continued on antiretroviral medications.   Blood pressures were monitored on TID basis and controlled   Rehab course: During patient's stay in rehab weekly team conferences were held to monitor patient's progress, set goals and discuss barriers to discharge. At admission, patient required minimal assist to ambulate 1 foot bilateral platform walker minimal guard stand pivot transfers.  Min mod assist ADLs  Physical exam.  Blood pressure 118/60 pulse 80 temperature 98 respirations 18 oxygen saturation 92% room air General.  No acute distress alert and oriented HEENT Head.  Normocephalic and atraumatic Eyes.  Pupils round and reactive to light no discharge without nystagmus Neck.  Supple nontender no JVD without thyromegaly Cardiac regular rate rhythm without any extra sounds or murmur heard Abdomen.  Soft nontender positive bowel sounds without rebound Extremities.  No clubbing cyanosis or edema 2+ pulses Skin.  Left hip incision dressed there was some bloody drainage no odor noted Neurological alert and oriented x3.  He/  has had improvement in activity tolerance, balance, postural control as well as ability to compensate for deficits. He/ has had improvement in functional use RUE/LUE  and RLE/LLE as well as improvement in awareness patient demonstrated supine to sit with supervision use of bed features.  Performed lateral scoot transfers to wheelchair with close supervision.  Perform AA heel slides x10 supervision.  He was able to transfer to edge of bed with supervision for ADLs and work on bathing tasks from that position.  Completed upper body bathing dressing with overall supervision.  He was able to complete both task with set up assistance for bathing dressing.  He was able to don his shorts over his feet without the use of a reacher.  Full family teaching was completed plan discharged home       Disposition: Discharged home    Diet: Regular  Special  Instructions: No driving smoking or alcohol  Touchdown weightbearing left lower extremity  Medications at discharge 1.  Tylenol as needed 2.  Ampicillin 2 g every 4 hours through  12/20/2019 and stop 3.  Lipitor 10 mg p.o. daily 4.Biktarvy 1 tablet daily 5.Prezcobix 800-150 mg daily 6.  Colace 100 mg twice daily 7.Savaysa 30 mg daily 8.  Lexapro 20 mg p.o. daily 9.  Zetia 10 mg p.o. daily 10.  Folic acid 1 mg daily 11.  Foltx 1 tablet twice daily 12.Arava 20 mg daily 13.  Robaxin 500 mg every 6 hours as needed muscle spasms 14.  Toprol-XL 50 mg daily 15.  Oxycodone 5 to 10 mg every 4 hours as needed pain 16.  Protonix 40 mg p.o. daily 17.  Prednisone 5 mg p.o. twice daily 18.  Lyrica 200 mg p.o. twice daily 19.  Bactrim DS 1 tablet p.o. daily 20.  Sulfasalazine 500 mg p.o. twice daily 21.  Flomax 0.4 mg p.o. daily 22 Demadex 10 mg p.o. daily 23.  Valtrex 500 mg p.o. daily  30-35 minutes were spent completing discharge summary and discharge planning     Follow-up Information    Kirsteins, Luanna Salk, MD Follow up.   Specialty: Physical Medicine and Rehabilitation Why: Only as needed Contact information: [place] [address] [phone]        Mcarthur Rossetti, MD Follow up.   Specialty: Orthopedic Surgery Why: Call for appointment Contact information: [place] [address] [phone]        Michel Bickers, MD Follow up.   Specialty: Infectious Diseases Why: Call for appointment Contact information: [address] [place] [address] [phone]               Signed: Cathlyn Parsons 11/15/2019, 12:33 PM

## 2019-11-15 NOTE — Progress Notes (Signed)
Physical Therapy Discharge Summary  Patient Details  Name: [name] MRN: [id number] Date of Birth: 05/26/1950  Today's Date: 11/16/2019 PT Individual Time: 1103-1157 PT Individual Time Calculation (min): 54 min    Patient has met 6 of 6 long term goals due to improved activity tolerance, improved balance, improved postural control, increased strength, decreased pain and ability to compensate for deficits.  Patient to discharge at a wheelchair level Modified Independent only performing lateral scoot transfers. Patient's care partner is independent to provide the necessary physical assistance for DME management at discharge.  All goals met.  Recommendation:  Patient will not benefit from follow-up therapy at this time until L LE weightbearing restrictions are upgraded to allow progression of functional mobility level. Patient is currently performing at mod-I level for the highest level of mobility his weightbearing and hip precautions allow. Pt has HEP to decrease atrophy of L LE muscles until these weight bearing restrictions are upgraded. Pt in agreement with these recommendations.  Equipment: No equipment provided - pt has all necessary DME.  Reasons for discharge: treatment goals met and discharge from hospital  Patient/family agrees with progress made and goals achieved: Yes  PT Discharge Precautions/Restrictions Precautions Precautions: Fall;Posterior Hip Precaution Comments: TDWB on LLE for tranfsers only Restrictions Weight Bearing Restrictions: Yes LLE Weight Bearing: Touchdown weight bearing Other Position/Activity Restrictions: TDWBing for transfers only Pain   Vision/Perception     Cognition   Sensation   Motor     Mobility Bed Mobility Bed Mobility: Supine to Sit;Sit to Supine Supine to Sit: Independent Sit to Supine: Independent Transfers Transfers: Lateral/Scoot Transfers Lateral/Scoot Transfers: Independent Transfer (Assistive device):  None Locomotion  Gait Ambulation: No Gait Gait: No Stairs / Additional Locomotion Stairs: No Wheelchair Mobility Wheelchair Mobility: No (due to B UE arthritis has to use power w/c or electric scooter)  Trunk/Postural Assessment     Balance   Extremity Assessment             Carly M Pippin, PT, DPT, CSRS   11/15/2019, 7:11 PM   

## 2019-11-15 NOTE — Telephone Encounter (Signed)
[name], [place]      11/11/19 5:12 PM Note   Spoke with the pt and notified of results/recs  He verbalized understanding  Order sent to DME          11/11/19 5:07 PM Raskin, Terance Hart, [place] contacted [name] "Phil"

## 2019-11-15 NOTE — Progress Notes (Signed)
Occupational Therapy Session Note  Patient Details  Name: [name] MRN: [id number] Date of Birth: Oct 06, 1950  Today's Date: 11/15/2019 OT Individual Time: 1105-1200 OT Individual Time Calculation (min): 55 min    Short Term Goals: Week 1:  OT Short Term Goal 1 (Week 1): STGs equal LTGs based on ELOS  Skilled Therapeutic Interventions/Progress Updates:    Session 1: ( 1105-1200)  Pt in bed to start session, agreeable to wash up from the EOB.  He was able to transfer to sitting with supervision and the completed all bathing in sitting with setup assist.  He performed lateral leans side to side for removal of shorts as well as for washing front and back peri area.  He was able to doff his gripper socks with use of the reacher and donn them after washing with use of the sockaide, both with setup only.  He donned his shorts with setup as well and lateral leans side to side.  He did complete one sit to stand with his elevated walker with supervision following weightbearing status to pull his shorts up slightly more in the back.  Finished session with scoot pivot transfer over to the wheelchair with setup.  Pt was left with call button and phone in reach.   Session 2: ([id number]) Pt in bed to start session with no pain reported in the LLE.  He transitioned to sitting with supervision and then used the urinal with setup.  He next transferred to the wheelchair with supervision to start session.  He then propelled his wheelchair down the hallway to the nurses station with supervision, but became fatigued and reported shoulder pain, so therapist assisted with propelling him to outside.  He completed wheelchair to seat transfers outside with supervision.  Therapist managed IV tubing and helped to stabilize wheelchair secondary to instability.  He then transferred back to the wheelchair at the same level.  While sitting on the seat therapist discussed splinting options available over the counter for treatment  of his RA and therapist provided visual reference of a splint as well.  Returned to the room at end of session with transfer back to the bed a supervision level.  Pt was left with the call button and phone in reach and safety alarm in place.    Therapy Documentation Precautions:  Precautions Precautions: Fall, Posterior Hip Precaution Comments: TDWB on LLE for tranfsers only Restrictions Weight Bearing Restrictions: Yes LLE Weight Bearing: Touchdown weight bearing Other Position/Activity Restrictions: TDWBing for transfers only   Pain: Pain Assessment Pain Scale: Faces Pain Score: 0-No pain Faces Pain Scale: Hurts a little bit Pain Type: Surgical pain Pain Location: Hip Pain Orientation: Left Pain Descriptors / Indicators: Discomfort Pain Onset: With Activity Pain Intervention(s): Repositioned ADL: See Care Tool Section for some details of mobility and selfcare  Therapy/Group: Individual Therapy  Hildy Nicholl OTR/L 11/15/2019, 12:34 PM

## 2019-11-15 NOTE — Progress Notes (Signed)
Patient ID: [name], male   DOB: 12-27-1950, 69 y.o.   MRN: [id number]  Patient declining drop arm bsc and HH follow up, will follow up with dtr

## 2019-11-15 NOTE — Telephone Encounter (Signed)
Enrolled patient for a 3 day Zio monitor to be mailed to patients home.  

## 2019-11-16 ENCOUNTER — Ambulatory Visit (HOSPITAL_COMMUNITY): Payer: Medicare Other | Admitting: Occupational Therapy

## 2019-11-16 ENCOUNTER — Inpatient Hospital Stay (HOSPITAL_COMMUNITY): Payer: Medicare Other | Admitting: Occupational Therapy

## 2019-11-16 ENCOUNTER — Encounter (HOSPITAL_COMMUNITY): Payer: Self-pay

## 2019-11-16 ENCOUNTER — Inpatient Hospital Stay (HOSPITAL_COMMUNITY): Payer: Medicare Other | Admitting: Physical Therapy

## 2019-11-16 MED ORDER — SULFAMETHOXAZOLE-TRIMETHOPRIM 800-160 MG PO TABS: 1.0000 | ORAL_TABLET | Freq: Every day | ORAL | 0 refills | Status: DC

## 2019-11-16 NOTE — Patient Care Conference (Signed)
Inpatient RehabilitationTeam Conference and Plan of Care Update Date: 11/16/2019   Time: 1:34 PM    Patient Name: [name]      Medical Record Number: [id number]  Date of Birth: [birth year] Sex: Male         Room/Bed: 4W02C/4W02C-01 Payor Info: Payor: MEDICARE / Plan: MEDICARE PART A AND B / Product Type: *No Product type* /    Admit Date/Time:  11/10/2019  1:10 PM  Primary Diagnosis:  Septic arthritis of pelvic region and thigh region [facility])  Hospital Problems: Principal Problem:   Septic arthritis of pelvic region and thigh region [facility]) Active Problems:   Left acetabular fracture [facility])    Expected Discharge Date: Expected Discharge Date: 11/16/19  Team Members Present: Physician leading conference: Dr. Alysia Penna Care Coodinator Present: Dorien Chihuahua, RN, BSN, CRRN;Christina Sampson Goon, [place] Nurse Present: Debroah Loop, RN PT Present: Barrie Folk, PT OT Present: Clyda Greener, OT PPS Coordinator present : Gunnar Fusi, SLP     Current Status/Progress Goal Weekly Team Focus  Bowel/Bladder             Swallow/Nutrition/ Hydration             ADL's   setup assist for bathing and dressing, supervision to modified independent for transfers, toileting tasks.  supervision overall  selfcare retraining, therapeutic activities, therapeutic exercise, balance retraining, DME education   Mobility   independent bed mobility, supervision lateral scoot transfers, supervision car transfer - planning to use electric scooter upon D/C and is unable to ambulate due to [facility] restrictions  supervision  transfer training, bed mobility, L LE strengthening within precautions, pt education, discharge planning, activity tolerance   Communication             Safety/Cognition/ Behavioral Observations            Pain             Skin               Team Discussion:Septic join, drainage from hematoma at incision site; Hgb stable. Overall mod I for transfers and able to  laterally lean for toileting at a supervision level.  Discharge Planning/Teaching Needs:  Goal to discharge home on 7-7  Will schedule education with spouse   Current Update: on target  Current Barriers to Discharge:  IV antibiotics and Wound care  Possible Resolutions to Barriers: Education with daughter on IV meds and dressing change to incision.  Patient on target to meet rehab goals: yes  *See Care Plan and progress notes for long and short-term goals.   Revisions to Treatment Plan:  Review use of adaptive equipment to maintain adherence with precautions for mobility.    Medical Summary Current Status: Hgb stable , afeb, wound Left hip improving Weekly Focus/Goal: D/C coordination for q 4h abx IV  Barriers to Discharge: IV antibiotics   Possible Resolutions to Barriers: none , d/c today   Continued Need for Acute Rehabilitation Level of Care: The patient requires daily medical management by a physician with specialized training in physical medicine and rehabilitation for the following reasons: Direction of a multidisciplinary physical rehabilitation program to maximize functional independence : Yes Medical management of patient stability for increased activity during participation in an intensive rehabilitation regime.: Yes Analysis of laboratory values and/or radiology reports with any subsequent need for medication adjustment and/or medical intervention. : Yes   I attest that I was present, lead the team conference, and concur with the assessment and plan of the  team.   Dorien Chihuahua B 11/16/2019, 1:34 PM

## 2019-11-16 NOTE — Progress Notes (Signed)
[facility] PHYSICAL MEDICINE & REHABILITATION PROGRESS NOTE   Subjective/Complaints:   HH referral for ABx and wounds Daughter coming in for training today   ROS- denies CP SOB, N/V/D  Objective:   No results found. No results for input(s): WBC, HGB, HCT, PLT in the last 72 hours. No results for input(s): NA, K, CL, CO2, GLUCOSE, BUN, CREATININE, CALCIUM in the last 72 hours.  Intake/Output Summary (Last 24 hours) at 11/16/2019 0851 Last data filed at 11/16/2019 0545 Gross per 24 hour  Intake 420 ml  Output 2850 ml  Net -2430 ml     Physical Exam: Vital Signs Blood pressure 127/69, pulse 83, temperature 98.1 F (36.7 C), resp. rate 17, height 5\' 9"  (1.753 m), weight 78 kg, SpO2 98 %.  General: No acute distress Mood and affect are appropriate Heart: Regular rate and rhythm no rubs murmurs or extra sounds Lungs: Clear to auscultation, breathing unlabored, no rales or wheezes Abdomen: Positive bowel sounds, soft nontender to palpation, nondistended Extremities: No clubbing, cyanosis, or edema Skin: No evidence of breakdown, no evidence of rash  Skin: Left thigh wound draining dark serosanguinous fluid, no erythema, no tenderness , top image today , bottom image 4-5 days  ago    7/6     Neurologic: Cranial nerves II through XII intact, motor strength is 5/5 in bilateral deltoid, bicep, tricep, grip, RIght hip flexor, knee extensors, ankle dorsiflexor and plantar flexor LLE trace HF, 3/5 KE Musculoskeletal:Reduce ROM Left hip   Assessment/Plan:  1. Functional deficits secondary to Left hip infection with revision of acetabular component  Stable for D/C today F/u PCP in 3-4 weeks F/u PM&R 2 weeks See D/C summary  See D/C instructions  Care Tool:  Bathing    Body parts bathed by patient: Right arm, Left arm, Chest, Abdomen, Front perineal area, Right upper leg, Left upper leg, Face, Right lower leg, Left lower leg, Buttocks   Body parts bathed by helper: Right  lower leg, Left lower leg Body parts n/a: Buttocks (nursing completed earlier)   Bathing assist Assist Level: Supervision/Verbal cueing     Upper Body Dressing/Undressing Upper body dressing   What is the patient wearing?: Pull over shirt    Upper body assist Assist Level: Set up assist    Lower Body Dressing/Undressing Lower body dressing      What is the patient wearing?: Pants     Lower body assist Assist for lower body dressing: Supervision/Verbal cueing     Toileting Toileting Toileting Activity did not occur (Clothing management and hygiene only):  (Urinal)  Toileting assist Assist for toileting: Minimal Assistance - Patient > 75% Assistive Device Comment: Urinal   Transfers Chair/bed transfer  Transfers assist     Chair/bed transfer assist level: Supervision/Verbal cueing (lateral scoot)     Locomotion Ambulation   Ambulation assist   Ambulation activity did not occur: Safety/medical concerns          Walk 10 feet activity   Assist  Walk 10 feet activity did not occur: Safety/medical concerns        Walk 50 feet activity   Assist Walk 50 feet with 2 turns activity did not occur: Safety/medical concerns         Walk 150 feet activity   Assist Walk 150 feet activity did not occur: Safety/medical concerns         Walk 10 feet on uneven surface  activity   Assist Walk 10 feet on uneven surfaces activity did not  occur: Safety/medical concerns         Wheelchair     Assist Will patient use wheelchair at discharge?: No (will use electric scooter)             Wheelchair 50 feet with 2 turns activity    Assist            Wheelchair 150 feet activity     Assist          Blood pressure 127/69, pulse 83, temperature 98.1 F (36.7 C), resp. rate 17, height 5\' 9"  (1.753 m), weight 78 kg, SpO2 98 %.  Medical Problem List and Plan: 1.  Decreased functional mobility secondary to failed left hip s/p  revision of acetabular component 11/03/2019 with history of left total hip arthroplasty 01/17/3381 complicated by multiple dislocations and recent fall           stable for D/C 2.  Antithrombotics: -DVT/anticoagulation: SCDs. Negative LE vascular study             -antiplatelet therapy: Aspirin 81 mg twice daily 3. Pain Management: Lyrica 200 mg twice daily, oxycodone and Robaxin as needed. Well controlled.   4. Mood: Lexapro 20 mg daily             -antipsychotic agents: N/A 5. Neuropsych: This patient is capable of making decisions on his own behalf. 6. Skin/Wound Care: Daily inspection of left hip. Incision is currently with bloody drainage, to be expected as per ortho.   7/5: Continues to have significant drainage 7. Fluids/Electrolytes/Nutrition: Routine in and outs with follow-up chemistries poor recorded po fluid intake  8.  Acute blood loss anemia.  Follow-up CBC. 6/29 Hgb 7.7, 7.8 on 7/2 stable  9.  HIV diagnosed 14.  Follow-up infectious disease.  Continue continue antiretroviral medications 10.  ID/enterococcal prosthetic infection left hip.  Continue intravenous ampicillin 2 g every 4 hours x6 weeks then converting to oral amoxicillin.  Follow-up per infectious disease Dr. Michel Bickers 11.  Hypertension.  Toprol 50 mg daily, Demadex 20 mg daily.  Monitor with increased mobility              HR well controlled. Decrease Torsemide to 10mg  daily.  Vitals:   11/15/19 1932 11/16/19 0419  BP: 112/66 127/69  Pulse: 78 83  Resp:    Temp: 98.4 F (36.9 C) 98.1 F (36.7 C)  SpO2: 95% 98%  7/7: well controlled. Can consider decreasing Torsemide further.  12.  Hyperlipidemia.  Lipitor/Zetia 13.  BPH.  Flomax 0.4 mg daily. 10.  Sleep apnea start nocturnal O2, noted to have bradycardia during OP sleep study , cardiology eval recommended , will check if this needs to be as OP or IP  LOS: 6 days A FACE TO FACE EVALUATION WAS PERFORMED  Charlett Blake 11/16/2019, 8:51 AM

## 2019-11-16 NOTE — Progress Notes (Signed)
Physical Therapy Session Note  Patient Details  Name: [name] MRN: [id number] Date of Birth: December 17, 1950  Today's Date: 11/16/2019 PT Individual Time: 1103-1157 PT Individual Time Calculation (min): 54 min   Short Term Goals: Week 1:  PT Short Term Goal 1 (Week 1): = to LTGs based on ELOS  Skilled Therapeutic Interventions/Progress Updates:  Pt presents supine in bed and agreeable to therapy w/ persistence.  Pt performed bed mobility w/ mod I, maintaining THR precautions.  Pt able to recall 3/3 THR precautions.  Pt performs lateral scoot transfer bed to w/c w/ supervision once w/c placed.  Pt negotiated w/c in hallway up to 57' w/ supervision using UEs.  Pt wheeled outside by PT and performed transfer w/c <> seat w/ supervision.  Pt able to negotiate w/c into place for transfers.  Pt performed seated TE including calf raises, LAQ for HEP and HS, GS, QS and SAQ in supine after returning to room.  Discussed weight-bearing restrictions w/ pt as well as possible progression after restrictions are lowered.  Pt returned to bed w/ supervision via lateral scoot.  Bed alarm on and handed off to PA for D/C discusion.     Therapy Documentation Precautions:  Precautions Precautions: Fall, Posterior Hip Precaution Comments: TDWB on LLE for tranfsers only Restrictions Weight Bearing Restrictions: Yes LLE Weight Bearing: Touchdown weight bearing Other Position/Activity Restrictions: TDWBing for transfers only General:   Vital Signs:   Pain:0/10. Pain Assessment Pain Scale: Faces Pain Score: 0-No pain    Therapy/Group: Individual Therapy  Ladoris Gene 11/16/2019, 12:38 PM

## 2019-11-16 NOTE — Discharge Instructions (Signed)
Inpatient Rehab Discharge Instructions  [name] Discharge date and time: No discharge date for patient encounter.   Activities/Precautions/ Functional Status: Activity: Touchdown weightbearing left lower extremity Diet: regular diet Wound Care: keep wound clean and dry Functional status:  ___ No restrictions     ___ Walk up steps independently ___ 24/7 supervision/assistance   ___ Walk up steps with assistance ___ Intermittent supervision/assistance  ___ Bathe/dress independently ___ Walk with walker     _x__ Bathe/dress with assistance ___ Walk Independently    ___ Shower independently ___ Walk with assistance    ___ Shower with assistance ___ No alcohol     ___ Return to work/school ________ COMMUNITY REFERRALS UPON DISCHARGE:    Home Health:   PT     OT    RN                 Agency: [facility] Phone: [phone]   Special Instructions: No driving smoking or alcohol  Continue ampicillin intravenously 2 g every 4 hours through 12/20/2019 and stop with follow-up infectious disease Dr. Michel Bickers with plan to transition to amoxicillin by mouth.   My questions have been answered and I understand these instructions. I will adhere to these goals and the provided educational materials after my discharge from the hospital.  Patient/Caregiver Signature _______________________________ Date __________  Clinician Signature _______________________________________ Date __________  Please bring this form and your medication list with you to all your follow-up doctor's appointments.

## 2019-11-16 NOTE — Plan of Care (Signed)
  Problem: RH SAFETY Goal: RH STG ADHERE TO SAFETY PRECAUTIONS W/ASSISTANCE/DEVICE Description: STG Adhere to Safety Precautions With cues and reminders.  Outcome: Progressing Goal: RH STG DECREASED RISK OF FALL WITH ASSISTANCE Description: STG Decreased Risk of Fall With cues and reminders.  Outcome: Progressing   Problem: RH SKIN INTEGRITY Goal: RH STG MAINTAIN SKIN INTEGRITY WITH ASSISTANCE Description: STG Maintain Skin Integrity With mod I Assistance. Outcome: Progressing

## 2019-11-16 NOTE — Progress Notes (Signed)
Occupational Therapy Discharge Summary  Patient Details  Name: [name] MRN: [id number] Date of Birth: 20-Sep-1950  Today's Date: 11/16/2019 OT Individual Time: 1696-7893 OT Individual Time Calculation (min): 68 min    Patient has met 9 of 9 long term goals due to improved balance and ability to compensate for deficits.  Patient to discharge at overall Supervision level.  Patient's care partner is independent to provide the necessary physical assistance at discharge.    Reasons goals not met: NA  Recommendation:  Patient will benefit from ongoing skilled OT services in home health setting to continue to advance functional skills in the area of BADL and Reduce care partner burden.  Feel pt will benefit from [facility] eval for safety and to continue progression to modified independent level for selfcare tasks at scooter or wheelchair level until weightbearing status increases.    Equipment: No equipment provided  Reasons for discharge: treatment goals met and discharge from hospital  Patient/family agrees with progress made and goals achieved: Yes  OT Discharge Precautions/Restrictions  Precautions Precautions: Fall;Posterior Hip Precaution Comments: TDWB on LLE for tranfsers only Restrictions Weight Bearing Restrictions: Yes LLE Weight Bearing: Touchdown weight bearing Other Position/Activity Restrictions: TDWBing for transfers only   ADL ADL Eating: Independent Where Assessed-Eating: Edge of bed Grooming: Independent Where Assessed-Grooming: Sitting at sink Upper Body Bathing: Setup Where Assessed-Upper Body Bathing: Edge of bed Lower Body Bathing: Supervision/safety Where Assessed-Lower Body Bathing: Edge of bed Upper Body Dressing: Independent Where Assessed-Upper Body Dressing: Edge of bed Lower Body Dressing: Supervision/safety Where Assessed-Lower Body Dressing: Edge of bed Toileting: Supervision/safety Where Assessed-Toileting: Bedside Commode Toilet Transfer:  Close supervision Toilet Transfer Method: Engineer, water: Grab bars, Raised toilet seat Tub/Shower Transfer: Close supervison Tub/Shower Transfer Method: Squat pivot Tub/Shower Equipment: Radio broadcast assistant Vision Baseline Vision/History: Wears glasses Wears Glasses: At all times Patient Visual Report: No change from baseline Vision Assessment?: No apparent visual deficits Perception  Perception: Within Functional Limits Praxis Praxis: Intact Cognition Overall Cognitive Status: Within Functional Limits for tasks assessed Arousal/Alertness: Awake/alert Attention: Focused;Sustained;Selective Focused Attention: Appears intact Sustained Attention: Appears intact Selective Attention: Appears intact Memory: Appears intact Safety/Judgment: Appears intact Sensation Sensation Light Touch: Impaired Detail Light Touch Impaired Details: Impaired RUE;Impaired LUE Hot/Cold: Not tested Proprioception: Impaired Detail Proprioception Impaired Details: Impaired RLE;Impaired LLE Stereognosis: Not tested Additional Comments: Pt reports history of peripheral neuropathy in all extremities Coordination Gross Motor Movements are Fluid and Coordinated: No Fine Motor Movements are Fluid and Coordinated: No Coordination and Movement Description: Pt with history of bilateral rotator cuff injurines as well as moderate arthritic changes in his hands, resulting in decreased FM and gross motor use. Motor  Motor Motor: Within Functional Limits Motor - Discharge Observations: still with generalized weakness throughout secondary to arthritic changes as well as new left hip revision Mobility  Bed Mobility Bed Mobility: Supine to Sit;Sit to Supine Supine to Sit: Independent Sit to Supine: Independent Transfers Sit to Stand: Supervision/Verbal cueing Stand to Sit: Supervision/Verbal cueing  Trunk/Postural Assessment  Cervical Assessment Cervical Assessment: Exceptions to [facility]  (forward head) Thoracic Assessment Thoracic Assessment: Exceptions to [facility] (thoracic kyphosis) Lumbar Assessment Lumbar Assessment: Exceptions to [facility] (increased lumbar flexion in standing)  Balance Balance Balance Assessed: Yes Static Sitting Balance Static Sitting - Balance Support: Feet supported Static Sitting - Level of Assistance: 7: Independent Dynamic Sitting Balance Dynamic Sitting - Balance Support: During functional activity Dynamic Sitting - Level of Assistance: 7: Independent Static Standing Balance Static Standing - Balance  Support: During functional activity;Bilateral upper extremity supported Static Standing - Level of Assistance: 5: Stand by assistance Extremity/Trunk Assessment RUE Assessment RUE Assessment: Exceptions to [facility] Passive Range of Motion (PROM) Comments: WFLS for shoulders and elbows Active Range of Motion (AROM) Comments: shoulder flexion 0-60 degrees, elbow flexion/extension AROM WFLs, digit flexion/extension WFLs but not arthritic changes in the joints General Strength Comments: shoulder 2-/5, elbow flexion/ext 4/5, grip 3+/5 LUE Assessment LUE Assessment: Exceptions to [facility] Active Range of Motion (AROM) Comments: shoulder flexion 0-90 degrees, elbow flexion/extension AROM WFLs, digit flexion/extension WFLs but not arthritic changes in the joints General Strength Comments: shoulder 3-/5, grip 3+/5, elbow flexion and extension 4/5   Darling Cieslewicz OTR/L 11/16/2019, 4:05 PM

## 2019-11-16 NOTE — Progress Notes (Signed)
Occupational Therapy Session Note  Patient Details  Name: [name] MRN: [id number] Date of Birth: 1950-10-14  Today's Date: 11/16/2019 OT Individual Time: 1517-6160 OT Individual Time Calculation (min): 68 min    Short Term Goals: Week 1:  OT Short Term Goal 1 (Week 1): STGs equal LTGs based on ELOS  Skilled Therapeutic Interventions/Progress Updates:    Pt worked on bathing and dressing sitting EOB during session.  He was able to complete all aspects of bathing with setup in sitting with lateral leans for washing and drying peri area.  He completed UB dressing at setup level secondary to IV line needing to be fed through the shirt.  He was able to donn his shorts with use of the reacher and complete lateral leans for pulling them part of the way up over his hips and then he completed sit to stand with supervision to finish pulling them up.  Therapist assisted with donning his TEDs and then he was able to complete donning his gripper socks with use of the sockaide.  He finished session sitting on the EOB with daughter present.  She was able to observe part of the session and therapist talked in length about pt's current level of performance with transfers and selfcare, which is supervision.  She will be able to provide assist as needed.    Therapy Documentation Precautions:  Precautions Precautions: Fall, Posterior Hip Precaution Comments: TDWB on LLE for tranfsers only Restrictions Weight Bearing Restrictions: Yes LLE Weight Bearing: Touchdown weight bearing Other Position/Activity Restrictions: TDWBing for transfers only   Pain: Pain Assessment Pain Scale: Faces Pain Score: 0-No pain ADL: See Care Tool Section for some details of mobility and selfcare  Therapy/Group: Individual Therapy  Juvenal Umar OTR/L 11/16/2019, 12:15 PM

## 2019-11-16 NOTE — Progress Notes (Signed)
Patient discharged home with sister, stable with all questions answered

## 2019-11-17 DIAGNOSIS — E1142 Type 2 diabetes mellitus with diabetic polyneuropathy: Secondary | ICD-10-CM | POA: Diagnosis not present

## 2019-11-17 DIAGNOSIS — Z792 Long term (current) use of antibiotics: Secondary | ICD-10-CM | POA: Diagnosis not present

## 2019-11-17 DIAGNOSIS — K5790 Diverticulosis of intestine, part unspecified, without perforation or abscess without bleeding: Secondary | ICD-10-CM | POA: Diagnosis not present

## 2019-11-17 DIAGNOSIS — F329 Major depressive disorder, single episode, unspecified: Secondary | ICD-10-CM | POA: Diagnosis not present

## 2019-11-17 DIAGNOSIS — I2510 Atherosclerotic heart disease of native coronary artery without angina pectoris: Secondary | ICD-10-CM | POA: Diagnosis not present

## 2019-11-17 DIAGNOSIS — B952 Enterococcus as the cause of diseases classified elsewhere: Secondary | ICD-10-CM | POA: Diagnosis not present

## 2019-11-17 DIAGNOSIS — E1136 Type 2 diabetes mellitus with diabetic cataract: Secondary | ICD-10-CM | POA: Diagnosis not present

## 2019-11-17 DIAGNOSIS — F419 Anxiety disorder, unspecified: Secondary | ICD-10-CM | POA: Diagnosis not present

## 2019-11-17 DIAGNOSIS — T8452XD Infection and inflammatory reaction due to internal left hip prosthesis, subsequent encounter: Secondary | ICD-10-CM | POA: Diagnosis not present

## 2019-11-17 DIAGNOSIS — M069 Rheumatoid arthritis, unspecified: Secondary | ICD-10-CM | POA: Diagnosis not present

## 2019-11-17 DIAGNOSIS — Z96653 Presence of artificial knee joint, bilateral: Secondary | ICD-10-CM | POA: Diagnosis not present

## 2019-11-17 DIAGNOSIS — D62 Acute posthemorrhagic anemia: Secondary | ICD-10-CM | POA: Diagnosis not present

## 2019-11-17 DIAGNOSIS — B20 Human immunodeficiency virus [HIV] disease: Secondary | ICD-10-CM | POA: Diagnosis not present

## 2019-11-17 DIAGNOSIS — C9110 Chronic lymphocytic leukemia of B-cell type not having achieved remission: Secondary | ICD-10-CM | POA: Diagnosis not present

## 2019-11-17 DIAGNOSIS — D689 Coagulation defect, unspecified: Secondary | ICD-10-CM | POA: Diagnosis not present

## 2019-11-17 DIAGNOSIS — J849 Interstitial pulmonary disease, unspecified: Secondary | ICD-10-CM | POA: Diagnosis not present

## 2019-11-17 DIAGNOSIS — Z452 Encounter for adjustment and management of vascular access device: Secondary | ICD-10-CM | POA: Diagnosis not present

## 2019-11-17 DIAGNOSIS — G8929 Other chronic pain: Secondary | ICD-10-CM | POA: Diagnosis not present

## 2019-11-17 DIAGNOSIS — E785 Hyperlipidemia, unspecified: Secondary | ICD-10-CM | POA: Diagnosis not present

## 2019-11-17 DIAGNOSIS — T84031D Mechanical loosening of internal left hip prosthetic joint, subsequent encounter: Secondary | ICD-10-CM | POA: Diagnosis not present

## 2019-11-17 DIAGNOSIS — I10 Essential (primary) hypertension: Secondary | ICD-10-CM | POA: Diagnosis not present

## 2019-11-17 DIAGNOSIS — Z951 Presence of aortocoronary bypass graft: Secondary | ICD-10-CM | POA: Diagnosis not present

## 2019-11-17 DIAGNOSIS — Z86718 Personal history of other venous thrombosis and embolism: Secondary | ICD-10-CM | POA: Diagnosis not present

## 2019-11-17 DIAGNOSIS — Z8673 Personal history of transient ischemic attack (TIA), and cerebral infarction without residual deficits: Secondary | ICD-10-CM | POA: Diagnosis not present

## 2019-11-17 DIAGNOSIS — M797 Fibromyalgia: Secondary | ICD-10-CM | POA: Diagnosis not present

## 2019-11-17 NOTE — Progress Notes (Signed)
Inpatient Rehabilitation Care Coordinator  Discharge Note  The overall goal for the admission was met for:   Discharge location: Yes, home  Length of Stay: Yes  Discharge activity level: Yes  Home/community participation: Yes  Services provided included: MD, RD, PT, OT, SLP, RN, CM, TR, Pharmacy, [place]: Medicare  Follow-up services arranged: Home Health: [facility]  Comments (or additional information): RN PT OT  Home 02 Set up  Patient/Family verbalized understanding of follow-up arrangements: Yes  Individual responsible for coordination of the follow-up plan: [name] [phone]  Confirmed correct DME delivered: Dyanne Iha 11/17/2019    Dyanne Iha

## 2019-11-18 ENCOUNTER — Telehealth: Payer: Self-pay | Admitting: *Deleted

## 2019-11-18 ENCOUNTER — Other Ambulatory Visit (INDEPENDENT_AMBULATORY_CARE_PROVIDER_SITE_OTHER): Payer: Medicare Other

## 2019-11-18 DIAGNOSIS — R001 Bradycardia, unspecified: Secondary | ICD-10-CM | POA: Diagnosis not present

## 2019-11-18 NOTE — Telephone Encounter (Signed)
1. Transitional Care call--1st attempt made to reach Mr [name]. Left message on his home/mobile number with appointment information and to watch mail for packet. Left callback number as well.  Appointment Tuesday 11/29/19 1:20 with Danella Sensing NP, arrive by 1:00 [address]

## 2019-11-19 ENCOUNTER — Other Ambulatory Visit: Payer: Self-pay | Admitting: Urology

## 2019-11-21 ENCOUNTER — Encounter: Payer: Self-pay | Admitting: Orthopaedic Surgery

## 2019-11-22 ENCOUNTER — Other Ambulatory Visit: Payer: Self-pay | Admitting: Internal Medicine

## 2019-11-22 ENCOUNTER — Telehealth: Payer: Self-pay

## 2019-11-22 DIAGNOSIS — B952 Enterococcus as the cause of diseases classified elsewhere: Secondary | ICD-10-CM | POA: Diagnosis not present

## 2019-11-22 DIAGNOSIS — E1142 Type 2 diabetes mellitus with diabetic polyneuropathy: Secondary | ICD-10-CM | POA: Diagnosis not present

## 2019-11-22 DIAGNOSIS — Z452 Encounter for adjustment and management of vascular access device: Secondary | ICD-10-CM | POA: Diagnosis not present

## 2019-11-22 DIAGNOSIS — E876 Hypokalemia: Secondary | ICD-10-CM | POA: Insufficient documentation

## 2019-11-22 DIAGNOSIS — T8452XA Infection and inflammatory reaction due to internal left hip prosthesis, initial encounter: Secondary | ICD-10-CM | POA: Diagnosis not present

## 2019-11-22 DIAGNOSIS — D62 Acute posthemorrhagic anemia: Secondary | ICD-10-CM | POA: Diagnosis not present

## 2019-11-22 DIAGNOSIS — T84031D Mechanical loosening of internal left hip prosthetic joint, subsequent encounter: Secondary | ICD-10-CM | POA: Diagnosis not present

## 2019-11-22 DIAGNOSIS — T8452XD Infection and inflammatory reaction due to internal left hip prosthesis, subsequent encounter: Secondary | ICD-10-CM | POA: Diagnosis not present

## 2019-11-22 MED ORDER — POTASSIUM CHLORIDE 20 MEQ PO PACK: 20.0000 meq | PACK | Freq: Three times a day (TID) | ORAL | 3 refills | Status: DC

## 2019-11-22 NOTE — Telephone Encounter (Signed)
I spoke with [name] and he does not want to keep appt.  He says that he is doing well and did not want HH or therapy so he feels the appt is unnecessary.  Cancelled appointment.

## 2019-11-22 NOTE — Telephone Encounter (Signed)
Pt informed of critical lab result and informed rx for K has been sent to the pharmacy with the directions for use.

## 2019-11-22 NOTE — Telephone Encounter (Signed)
Please advise on Critical Lab below:  [name] with [facility] HH case manager - wound infection of hip prosthetic and wound care provider ordered labs.   7.8.21 pt was admitted to [facility].   Current orders for weekly labs from wound care.   Critical lab: Potassium is 2.8  Contacted information for [facility] in [place] Phone: [phone] Fax: [phone]

## 2019-11-24 ENCOUNTER — Other Ambulatory Visit: Payer: Self-pay | Admitting: Internal Medicine

## 2019-11-24 ENCOUNTER — Encounter: Payer: Self-pay | Admitting: Internal Medicine

## 2019-11-24 DIAGNOSIS — T502X5A Adverse effect of carbonic-anhydrase inhibitors, benzothiadiazides and other diuretics, initial encounter: Secondary | ICD-10-CM

## 2019-11-24 DIAGNOSIS — E876 Hypokalemia: Secondary | ICD-10-CM

## 2019-11-24 MED ORDER — POTASSIUM CHLORIDE CRYS ER 20 MEQ PO TBCR: 20.0000 meq | EXTENDED_RELEASE_TABLET | Freq: Three times a day (TID) | ORAL | 0 refills | Status: DC

## 2019-11-25 DIAGNOSIS — T8452XD Infection and inflammatory reaction due to internal left hip prosthesis, subsequent encounter: Secondary | ICD-10-CM | POA: Diagnosis not present

## 2019-11-25 DIAGNOSIS — B952 Enterococcus as the cause of diseases classified elsewhere: Secondary | ICD-10-CM | POA: Diagnosis not present

## 2019-11-25 DIAGNOSIS — T84031D Mechanical loosening of internal left hip prosthetic joint, subsequent encounter: Secondary | ICD-10-CM | POA: Diagnosis not present

## 2019-11-25 DIAGNOSIS — E1142 Type 2 diabetes mellitus with diabetic polyneuropathy: Secondary | ICD-10-CM | POA: Diagnosis not present

## 2019-11-25 DIAGNOSIS — D62 Acute posthemorrhagic anemia: Secondary | ICD-10-CM | POA: Diagnosis not present

## 2019-11-25 DIAGNOSIS — Z452 Encounter for adjustment and management of vascular access device: Secondary | ICD-10-CM | POA: Diagnosis not present

## 2019-11-26 ENCOUNTER — Other Ambulatory Visit: Payer: Self-pay | Admitting: Internal Medicine

## 2019-11-26 ENCOUNTER — Encounter: Payer: Self-pay | Admitting: Orthopaedic Surgery

## 2019-11-26 DIAGNOSIS — I82403 Acute embolism and thrombosis of unspecified deep veins of lower extremity, bilateral: Secondary | ICD-10-CM

## 2019-11-28 DIAGNOSIS — Z452 Encounter for adjustment and management of vascular access device: Secondary | ICD-10-CM | POA: Diagnosis not present

## 2019-11-28 DIAGNOSIS — D62 Acute posthemorrhagic anemia: Secondary | ICD-10-CM | POA: Diagnosis not present

## 2019-11-28 DIAGNOSIS — T84031D Mechanical loosening of internal left hip prosthetic joint, subsequent encounter: Secondary | ICD-10-CM | POA: Diagnosis not present

## 2019-11-28 DIAGNOSIS — E1142 Type 2 diabetes mellitus with diabetic polyneuropathy: Secondary | ICD-10-CM | POA: Diagnosis not present

## 2019-11-28 DIAGNOSIS — B952 Enterococcus as the cause of diseases classified elsewhere: Secondary | ICD-10-CM | POA: Diagnosis not present

## 2019-11-28 DIAGNOSIS — T8452XD Infection and inflammatory reaction due to internal left hip prosthesis, subsequent encounter: Secondary | ICD-10-CM | POA: Diagnosis not present

## 2019-11-29 ENCOUNTER — Other Ambulatory Visit: Payer: Self-pay

## 2019-11-29 ENCOUNTER — Encounter: Payer: Medicare Other | Admitting: Registered Nurse

## 2019-11-29 ENCOUNTER — Ambulatory Visit: Payer: Medicare Other | Admitting: Infectious Diseases

## 2019-11-29 ENCOUNTER — Ambulatory Visit (INDEPENDENT_AMBULATORY_CARE_PROVIDER_SITE_OTHER): Payer: Medicare Other | Admitting: Orthopaedic Surgery

## 2019-11-29 ENCOUNTER — Telehealth (INDEPENDENT_AMBULATORY_CARE_PROVIDER_SITE_OTHER): Payer: Medicare Other | Admitting: Infectious Diseases

## 2019-11-29 ENCOUNTER — Encounter: Payer: Self-pay | Admitting: Orthopaedic Surgery

## 2019-11-29 ENCOUNTER — Ambulatory Visit (INDEPENDENT_AMBULATORY_CARE_PROVIDER_SITE_OTHER): Payer: Medicare Other

## 2019-11-29 VITALS — Ht 69.0 in | Wt 171.0 lb

## 2019-11-29 DIAGNOSIS — Z96642 Presence of left artificial hip joint: Secondary | ICD-10-CM

## 2019-11-29 DIAGNOSIS — R233 Spontaneous ecchymoses: Secondary | ICD-10-CM

## 2019-11-29 DIAGNOSIS — E1142 Type 2 diabetes mellitus with diabetic polyneuropathy: Secondary | ICD-10-CM

## 2019-11-29 DIAGNOSIS — Z856 Personal history of leukemia: Secondary | ICD-10-CM

## 2019-11-29 DIAGNOSIS — M069 Rheumatoid arthritis, unspecified: Secondary | ICD-10-CM | POA: Diagnosis not present

## 2019-11-29 DIAGNOSIS — J8410 Pulmonary fibrosis, unspecified: Secondary | ICD-10-CM | POA: Diagnosis not present

## 2019-11-29 DIAGNOSIS — T8452XD Infection and inflammatory reaction due to internal left hip prosthesis, subsequent encounter: Secondary | ICD-10-CM | POA: Diagnosis not present

## 2019-11-29 DIAGNOSIS — T8452XA Infection and inflammatory reaction due to internal left hip prosthesis, initial encounter: Secondary | ICD-10-CM | POA: Diagnosis not present

## 2019-11-29 DIAGNOSIS — B952 Enterococcus as the cause of diseases classified elsewhere: Secondary | ICD-10-CM

## 2019-11-29 DIAGNOSIS — G4730 Sleep apnea, unspecified: Secondary | ICD-10-CM

## 2019-11-29 DIAGNOSIS — Z21 Asymptomatic human immunodeficiency virus [HIV] infection status: Secondary | ICD-10-CM

## 2019-11-29 NOTE — Assessment & Plan Note (Signed)
Will have him f/u with his PCP, want to be sure no deeper source.

## 2019-11-29 NOTE — Assessment & Plan Note (Signed)
Sugars "I think they've been ok" Will f/u with PCP

## 2019-11-29 NOTE — Assessment & Plan Note (Addendum)
Will plan on 6 weeks of ampicillin with end date of 12-20-19 My great appreciation to Dr Ninfa Linden.  Daughter lives next door, helping.  Weekly home RN.  Will try to get his labs (ESR and CRP).  Will see him back in 1 month

## 2019-11-29 NOTE — Assessment & Plan Note (Signed)
Noted on sleep study to be brady and hypoxic. He has since been started on home O2.

## 2019-11-29 NOTE — Progress Notes (Signed)
   Subjective:    Patient ID: [name], male    DOB: 24-Jan-1951, 69 y.o.   MRN: [id number]  HPI 69yo M with hx of HIV+, CLL(dx 2007), pulmonary fibrosis, and RA for which he takes MTX. He also has a hx of esophageal strictures dilated by EGD every 1.5 yrs.  06-2016 he underwent lumbar laminectomy.No problems. "Dr Saintclair Halsted did an outstanding job. I praise the ground he walks on". Sees Onc yearly- told that he is doing well,(Stage 0)continue surveillance. He was seen by Rheum-  onleflunamide. He has been seen neuro for his neuropathy (ART or virus induced).  He feels like this is about the same- feet get hot (doesn't like to wear shoes), feels like he is walking on bones.   He had dislocation of his L THR (done 09-17-2018). He had revision of this 11-02-2018.  He is scheduled to have repeat hip surgery 11-03-19. (his 5th).   He has since developed infection in his hip (6-24 to 11-09-19) (7-2 to 7-6). His Cx grew Enteroccocus (r-gent) Cx 11-03-19.  He was started on IV ampicillin via PIC (end date 12-20-19) and then planned onto po rx.  Still has some creamy wound d/c. Had ortho f/u today.  He has been feeling "ok". Denies problems with diarrhea or rash.  No problems with biktarvy-DRVc. He is not sure that this has helped his neuropathy (feet). He has f/u with podiatry.   He has "black and blue" on his foot after injury. He is not sure what caused.   He has gotten COVID vax.   Review of Systems  HIV 1 RNA Quant (copies/mL)  Date Value  08/04/2019 <20 NOT DETECTED  10/28/2018 <20 NOT DETECTED  01/07/2018 <20 DETECTED (A)   CD4 T Cell Abs (/uL)  Date Value  08/04/2019 1,255  10/28/2018 681  01/07/2018 1,220        Objective:   Physical Exam He ha bruising, echymoses around his R great toe. Top of foot and lateral/top of foot.  Edema.        Assessment & Plan:

## 2019-11-29 NOTE — Progress Notes (Signed)
The patient comes in today now several weeks after having a revision of his acetabular component due to failed external component of the left hip.  He has had multiple surgeries on left hip as well.  We have had him nonweightbearing.  We put in a dual mobility acetabular component.  This was due to multiple dislocations in the past.  He has been was noted to have an infection.  This is being followed infectious disease and he has a PICC line.  On examination incision I removed all the staples.  There is no redness.  In the central aspect there is a small area of clear drainage.  I will have him treat this daily with Bactroban ointment and dressing changes.  When I have him lay in the supine position I feel like there is just some slight shortening on the left side than the right side.  I did obtain an AP pelvis supine today to make sure that the as several components in a good position and it does appear to be in a much improved position that it was compared to his preoperative films.  Starting next week Hello him to weight-bear as tolerated with assistance.  I would like to see him back in just 2 weeks for repeat supine AP pelvis.  He will treat the small draining wound daily with cleaning it and then placing Bactroban ointment.  All question concerns were answered and addressed.

## 2019-11-29 NOTE — Assessment & Plan Note (Signed)
He is doing well.  Will get him to f/u with his PCP about his foot ecchymoses.  rtc in 6 months.

## 2019-12-05 ENCOUNTER — Telehealth: Payer: Self-pay

## 2019-12-05 DIAGNOSIS — E1142 Type 2 diabetes mellitus with diabetic polyneuropathy: Secondary | ICD-10-CM | POA: Diagnosis not present

## 2019-12-05 DIAGNOSIS — Z452 Encounter for adjustment and management of vascular access device: Secondary | ICD-10-CM | POA: Diagnosis not present

## 2019-12-05 DIAGNOSIS — T84031D Mechanical loosening of internal left hip prosthetic joint, subsequent encounter: Secondary | ICD-10-CM | POA: Diagnosis not present

## 2019-12-05 DIAGNOSIS — B952 Enterococcus as the cause of diseases classified elsewhere: Secondary | ICD-10-CM | POA: Diagnosis not present

## 2019-12-05 DIAGNOSIS — D62 Acute posthemorrhagic anemia: Secondary | ICD-10-CM | POA: Diagnosis not present

## 2019-12-05 DIAGNOSIS — I2510 Atherosclerotic heart disease of native coronary artery without angina pectoris: Secondary | ICD-10-CM | POA: Diagnosis not present

## 2019-12-05 DIAGNOSIS — T8452XD Infection and inflammatory reaction due to internal left hip prosthesis, subsequent encounter: Secondary | ICD-10-CM | POA: Diagnosis not present

## 2019-12-05 NOTE — Telephone Encounter (Signed)
New message    Home health calling with some concerns    Swelling and bruise right lower extremities please advise on next steps.

## 2019-12-05 NOTE — Telephone Encounter (Signed)
Any recommendation for swelling and bruises?

## 2019-12-07 ENCOUNTER — Encounter: Payer: Self-pay | Admitting: Internal Medicine

## 2019-12-07 DIAGNOSIS — E1142 Type 2 diabetes mellitus with diabetic polyneuropathy: Secondary | ICD-10-CM

## 2019-12-08 ENCOUNTER — Encounter: Payer: Self-pay | Admitting: Urology

## 2019-12-08 ENCOUNTER — Ambulatory Visit (INDEPENDENT_AMBULATORY_CARE_PROVIDER_SITE_OTHER): Payer: Medicare Other | Admitting: Urology

## 2019-12-08 ENCOUNTER — Other Ambulatory Visit: Payer: Self-pay

## 2019-12-08 ENCOUNTER — Ambulatory Visit (INDEPENDENT_AMBULATORY_CARE_PROVIDER_SITE_OTHER): Payer: Medicare Other | Admitting: Internal Medicine

## 2019-12-08 ENCOUNTER — Encounter: Payer: Self-pay | Admitting: Internal Medicine

## 2019-12-08 VITALS — BP 126/66 | HR 64 | Temp 98.1°F | Resp 16 | Ht 69.0 in | Wt 169.0 lb

## 2019-12-08 VITALS — BP 95/55 | HR 82 | Temp 98.1°F

## 2019-12-08 DIAGNOSIS — R600 Localized edema: Secondary | ICD-10-CM | POA: Diagnosis not present

## 2019-12-08 DIAGNOSIS — R233 Spontaneous ecchymoses: Secondary | ICD-10-CM

## 2019-12-08 DIAGNOSIS — E5111 Dry beriberi: Secondary | ICD-10-CM | POA: Diagnosis not present

## 2019-12-08 DIAGNOSIS — R3912 Poor urinary stream: Secondary | ICD-10-CM

## 2019-12-08 DIAGNOSIS — E876 Hypokalemia: Secondary | ICD-10-CM

## 2019-12-08 DIAGNOSIS — I5033 Acute on chronic diastolic (congestive) heart failure: Secondary | ICD-10-CM

## 2019-12-08 DIAGNOSIS — D508 Other iron deficiency anemias: Secondary | ICD-10-CM | POA: Diagnosis not present

## 2019-12-08 DIAGNOSIS — I2510 Atherosclerotic heart disease of native coronary artery without angina pectoris: Secondary | ICD-10-CM

## 2019-12-08 DIAGNOSIS — I10 Essential (primary) hypertension: Secondary | ICD-10-CM

## 2019-12-08 DIAGNOSIS — T502X5A Adverse effect of carbonic-anhydrase inhibitors, benzothiadiazides and other diuretics, initial encounter: Secondary | ICD-10-CM | POA: Diagnosis not present

## 2019-12-08 DIAGNOSIS — N400 Enlarged prostate without lower urinary tract symptoms: Secondary | ICD-10-CM

## 2019-12-08 DIAGNOSIS — Z7901 Long term (current) use of anticoagulants: Secondary | ICD-10-CM

## 2019-12-08 DIAGNOSIS — R001 Bradycardia, unspecified: Secondary | ICD-10-CM | POA: Diagnosis not present

## 2019-12-08 LAB — URINALYSIS, ROUTINE W REFLEX MICROSCOPIC
Bilirubin, UA: NEGATIVE
Glucose, UA: NEGATIVE
Ketones, UA: NEGATIVE
Leukocytes,UA: NEGATIVE
Nitrite, UA: NEGATIVE
Protein,UA: NEGATIVE
RBC, UA: NEGATIVE
Specific Gravity, UA: 1.02 (ref 1.005–1.030)
Urobilinogen, Ur: 0.2 mg/dL (ref 0.2–1.0)
pH, UA: 7.5 (ref 5.0–7.5)

## 2019-12-08 MED ORDER — TORSEMIDE 10 MG PO TABS: 10.0000 mg | ORAL_TABLET | Freq: Every day | ORAL | 0 refills | Status: DC

## 2019-12-08 MED ORDER — ALFUZOSIN HCL ER 10 MG PO TB24
10.0000 mg | ORAL_TABLET | Freq: Every day | ORAL | 11 refills | Status: DC
Start: 2019-12-08 — End: 2020-04-04

## 2019-12-08 NOTE — Progress Notes (Signed)
Subjective:  Patient ID: [name], male    DOB: [birth year]  Age: 69 y.o. MRN: [id number]  CC: Leg Swelling (Bilateral leg swelling; Right leg greater than left leg)  This visit occurred during the SARS-CoV-2 public health emergency.  Safety protocols were in place, including screening questions prior to the visit, additional usage of staff PPE, and extensive cleaning of exam room while observing appropriate contact time as indicated for disinfecting solutions.   HPI [name] presents for f/up - V      ery complicated male comes in complaining of a spontaneous bruises on his right chest, right ankle, and both arms.  He has CLL and a low platelet count.  He is anticoagulated with the DOAC.  He also complains of intermittent lower extremity edema.  He recently underwent a left lower extremity ultrasound which was negative for deep venous thrombosis.  He has a remote history of nosebleeds but no bleeding recently.  Outpatient Medications Prior to Visit  Medication Sig Dispense Refill  . acetaminophen (TYLENOL) 500 MG tablet Take 500-1,000 mg by mouth every 6 (six) hours as needed for mild pain or moderate pain.     [name] alfuzosin (UROXATRAL) 10 MG 24 hr tablet Take 1 tablet (10 mg total) by mouth daily with breakfast. 30 tablet 11  . ampicillin IVPB Inject 12 g into the vein daily. Inject 12 g into the vein daily. As a continuous infusion  Indication: hip infection First Dose: Yes  Last Day of Therapy: 12/20/2019  Labs - Once weekly: CBC/D and BMP,  Labs - Every other week: ESR and CRP  Method of administration: Ambulatory Pump (Continuous Infusion)  Method of administration may be changed at the discretion of home infusion pharmacist based upon assessment of the patient and/or caregiver's ability to self-administer the medication ordered 35 Units 0  . Ampicillin Sodium 10 g SOLR     . atorvastatin (LIPITOR) 10 MG tablet Take 1 tablet (10 mg total) by mouth at bedtime. 30 tablet  9  . bictegravir-emtricitabine-tenofovir AF (BIKTARVY) 50-200-25 MG TABS tablet Take 1 tablet by mouth daily.     . darunavir-cobicistat (PREZCOBIX) 800-150 MG tablet Take 1 tablet by mouth daily with breakfast.     . docusate sodium (COLACE) 100 MG capsule Take 1 capsule (100 mg total) by mouth 2 (two) times daily. 10 capsule 0  . EPINEPHrine (EPIPEN 2-PAK) 0.3 mg/0.3 mL IJ SOAJ injection Inject 0.3 mg into the muscle as needed for anaphylaxis.    [name] escitalopram (LEXAPRO) 20 MG tablet Take 1 tablet (20 mg total) by mouth daily. 90 tablet 1  . ezetimibe (ZETIA) 10 MG tablet Take 1 tablet (10 mg total) by mouth daily. (Patient taking differently: Take 10 mg by mouth at bedtime. ) 90 tablet 3  . folic acid (FOLVITE) 1 MG tablet Take 1 mg by mouth daily.  11  . l-methylfolate-B6-B12 (METANX) 3-35-2 MG TABS tablet Take 1 tablet by mouth 2 (two) times daily. 60 tablet 5  . leflunomide (ARAVA) 20 MG tablet Take 20 mg by mouth daily.     . methocarbamol (ROBAXIN) 500 MG tablet Take 1 tablet (500 mg total) by mouth every 6 (six) hours as needed for muscle spasms. 60 tablet 0  . metoprolol succinate (TOPROL-XL) 50 MG 24 hr tablet Take 1 tablet (50 mg total) by mouth daily. Take with or immediately following a meal. 30 tablet 0  . nitroGLYCERIN (NITROSTAT) 0.4 MG SL tablet Place 1 tablet (0.4 mg  total) under the tongue every 5 (five) minutes as needed for chest pain. 25 tablet 2  . ondansetron (ZOFRAN) 4 MG tablet Take 4 mg by mouth every 6 (six) hours as needed for nausea.    [name] oxyCODONE (OXY IR/ROXICODONE) 5 MG immediate release tablet Take 1-2 tablets (5-10 mg total) by mouth every 4 (four) hours as needed for moderate pain (pain score 4-6). 30 tablet 0  . pantoprazole (PROTONIX) 40 MG tablet Take 1 tablet (40 mg total) by mouth daily. 30 tablet 0  . potassium chloride SA (KLOR-CON) 20 MEQ tablet Take 1 tablet (20 mEq total) by mouth 3 (three) times daily. 270 tablet 0  . predniSONE (DELTASONE) 5 MG tablet  Take 5 mg by mouth 2 (two) times daily.   1  . pregabalin (LYRICA) 200 MG capsule TAKE 1 CAPSULE BY MOUTH TWICE A DAY (Patient taking differently: Take 200 mg by mouth 2 (two) times daily. ) 60 capsule 3  . SAVAYSA 30 MG TABS tablet TAKE 1 TABLET (30 MG TOTAL) BY MOUTH DAILY. 30 tablet 5  . sulfamethoxazole-trimethoprim (BACTRIM DS) 800-160 MG tablet Take 1 tablet by mouth daily. 30 tablet 0  . sulfaSALAzine (AZULFIDINE) 500 MG tablet Take 500 mg by mouth in the morning and at bedtime.     . tamsulosin (FLOMAX) 0.4 MG CAPS capsule TAKE 1 CAPSULE (0.4 MG TOTAL) BY MOUTH DAILY AFTER SUPPER. 30 capsule 1  . valACYclovir (VALTREX) 500 MG tablet TAKE 1 TABLET BY MOUTH EVERY DAY (Patient taking differently: Take 500 mg by mouth daily. ) 30 tablet 4  . torsemide (DEMADEX) 10 MG tablet Take 1 tablet (10 mg total) by mouth daily. 30 tablet 0   No facility-administered medications prior to visit.    ROS Review of Systems  Constitutional: Positive for fatigue. Negative for appetite change, chills, diaphoresis and unexpected weight change.  HENT: Negative.  Negative for nosebleeds, sore throat and trouble swallowing.   Eyes: Negative.   Respiratory: Positive for shortness of breath. Negative for cough, chest tightness and wheezing.   Cardiovascular: Negative for chest pain, palpitations and leg swelling.  Gastrointestinal: Negative for abdominal pain, blood in stool, constipation, diarrhea, nausea and vomiting.  Endocrine: Negative.   Genitourinary: Negative.  Negative for difficulty urinating.  Musculoskeletal: Negative.  Negative for arthralgias and neck stiffness.  Skin: Negative.  Negative for color change, pallor and rash.  Neurological: Positive for weakness and numbness. Negative for dizziness, seizures and light-headedness.  Hematological: Negative for adenopathy. Bruises/bleeds easily.  Psychiatric/Behavioral: Negative.     Objective:  BP 126/66 (BP Location: Left Arm, Patient Position:  Sitting, Cuff Size: Normal)   Pulse 64   Temp 98.1 F (36.7 C) (Oral)   Resp 16   Ht '5\' 9"'$  (1.753 m)   Wt 169 lb (76.7 kg)   SpO2 93%   BMI 24.96 kg/m   BP Readings from Last 3 Encounters:  12/08/19 126/66  12/08/19 (!) 95/55  11/16/19 (!) 88/52    Wt Readings from Last 3 Encounters:  12/08/19 169 lb (76.7 kg)  11/29/19 171 lb (77.6 kg)  11/10/19 171 lb 15.3 oz (78 kg)    Physical Exam Constitutional:      General: He is not in acute distress.    Appearance: He is ill-appearing (on a scooter). He is not toxic-appearing or diaphoretic.  HENT:     Mouth/Throat:     Mouth: Mucous membranes are moist.  Eyes:     General: No scleral icterus.  Conjunctiva/sclera: Conjunctivae normal.  Cardiovascular:     Rate and Rhythm: Normal rate and regular rhythm.     Heart sounds: Murmur heard.  Systolic murmur is present with a grade of 1/6.  No diastolic murmur is present.  No gallop.   Pulmonary:     Effort: Pulmonary effort is normal.     Breath sounds: No stridor. No wheezing, rhonchi or rales.  Abdominal:     General: Abdomen is flat.     Palpations: There is no mass.     Tenderness: There is no abdominal tenderness.  Musculoskeletal:     Cervical back: Neck supple.     Right lower leg: 1+ Edema present.     Left lower leg: 1+ Edema present.  Lymphadenopathy:     Cervical: No cervical adenopathy.  Skin:    Coloration: Skin is pale. Skin is not jaundiced.     Findings: Bruising present. No rash.     Comments: Multiple ecchymoses noted.  See photos.  Neurological:     General: No focal deficit present.     Mental Status: He is alert. Mental status is at baseline.  Psychiatric:        Mood and Affect: Mood normal.     Lab Results  Component Value Date   WBC 11.1 (H) 12/08/2019   HGB 10.4 (L) 12/08/2019   HCT 31.9 (L) 12/08/2019   PLT 107 (L) 12/08/2019   GLUCOSE 118 (H) 12/08/2019   CHOL 214 (H) 08/04/2019   TRIG 180 (H) 08/04/2019   HDL 40 08/04/2019    LDLCALC 142 (H) 08/04/2019   ALT 14 12/08/2019   AST 24 12/08/2019   NA 141 12/08/2019   K 3.3 (L) 12/08/2019   CL 102 12/08/2019   CREATININE 0.89 12/08/2019   BUN 15 12/08/2019   CO2 30 12/08/2019   TSH 1.12 12/08/2019   PSA 1.2 09/21/2019   INR 1.0 12/08/2019   HGBA1C 4.4 (L) 09/15/2019   MICROALBUR 2.6 (H) 11/16/2018    VAS [place] LOWER EXTREMITY VENOUS (DVT)  Result Date: 11/11/2019  Lower Venous DVTStudy Indications: Swelling.  Comparison Study: 11/17/18 previous Performing Technologist: Abram Sander RVS  Examination Guidelines: A complete evaluation includes B-mode imaging, spectral Doppler, color Doppler, and power Doppler as needed of all accessible portions of each vessel. Bilateral testing is considered an integral part of a complete examination. Limited examinations for reoccurring indications may be performed as noted. The reflux portion of the exam is performed with the patient in reverse Trendelenburg.  +---------+---------------+---------+-----------+----------+--------------+ RIGHT    CompressibilityPhasicitySpontaneityPropertiesThrombus Aging +---------+---------------+---------+-----------+----------+--------------+ CFV      Full           Yes      Yes                                 +---------+---------------+---------+-----------+----------+--------------+ SFJ      Full                                                        +---------+---------------+---------+-----------+----------+--------------+ FV Prox  Full                                                        +---------+---------------+---------+-----------+----------+--------------+  FV Mid   Full                                                        +---------+---------------+---------+-----------+----------+--------------+ FV DistalFull                                                        +---------+---------------+---------+-----------+----------+--------------+ PFV      Full                                                         +---------+---------------+---------+-----------+----------+--------------+ POP      Full           Yes      Yes                                 +---------+---------------+---------+-----------+----------+--------------+ PTV      Full                                                        +---------+---------------+---------+-----------+----------+--------------+ PERO     Full                                                        +---------+---------------+---------+-----------+----------+--------------+   +---------+---------------+---------+-----------+----------+--------------+ LEFT     CompressibilityPhasicitySpontaneityPropertiesThrombus Aging +---------+---------------+---------+-----------+----------+--------------+ CFV      Full           Yes      Yes                                 +---------+---------------+---------+-----------+----------+--------------+ SFJ      Full                                                        +---------+---------------+---------+-----------+----------+--------------+ FV Prox  Full                                                        +---------+---------------+---------+-----------+----------+--------------+ FV Mid   Full                                                        +---------+---------------+---------+-----------+----------+--------------+  FV DistalFull                                                        +---------+---------------+---------+-----------+----------+--------------+ PFV      Full                                                        +---------+---------------+---------+-----------+----------+--------------+ POP      Full           Yes      Yes                                 +---------+---------------+---------+-----------+----------+--------------+ PTV      Full                                                         +---------+---------------+---------+-----------+----------+--------------+ PERO     Full                                                        +---------+---------------+---------+-----------+----------+--------------+     Summary: BILATERAL: - No evidence of deep vein thrombosis seen in the lower extremities, bilaterally. -No evidence of popliteal cyst, bilaterally.   *See table(s) above for measurements and observations. Electronically signed by Sherald Hess MD on 11/11/2019 at 6:30:17 PM.    Final     Assessment & Plan:   Vanden was seen today for leg swelling.  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 -     CBC with Differential/Platelet; Future -     CBC with Differential/Platelet  Bilateral leg edema- This is multifactorial related to a history of diastolic dysfunction, poor nutritional status, medications, and gravitational forces.  His current dose of torsemide is 20 mg so I recommended that he add 10 mg as needed for the lower extremity edema. -     TSH; Future -     Hepatic function panel; Future -     torsemide (DEMADEX) 10 MG tablet; Take 1 tablet (10 mg total) by mouth daily. -     Hepatic function panel -     TSH  Diuretic-induced hypokalemia- His potassium level remains low.  I have asked him to be more compliant with the potassium supplement. -     Magnesium; Future -     BASIC METABOLIC PANEL WITH GFR; Future -     BASIC METABOLIC PANEL WITH GFR -     Magnesium  Ecchymoses, spontaneous- This is multifactorial - related to chronic thrombocytopenia, anticoagulation with the DOAC, and a history of HIV/syph/CLL.  I offered him reassurance. -     CBC with Differential/Platelet; Future -     Hepatic function panel; Future -     Protime-INR;  Future -     APTT; Future -     APTT -     Protime-INR -     Hepatic function panel -     CBC with Differential/Platelet  Long term current use of  anticoagulant therapy  Iron deficiency anemia due to dietary causes- His H&H have improved. -     CBC with Differential/Platelet; Future -     Iron; Future -     Ferritin; Future -     Ferritin -     Iron -     CBC with Differential/Platelet  Essential hypertension- His blood pressure is adequately well controlled.   I am having [name]. Rice "Phil" maintain his predniSONE, folic acid, nitroGLYCERIN, leflunomide, acetaminophen, EPINEPHrine, ondansetron, pregabalin, l-methylfolate-B6-B12, sulfaSALAzine, Biktarvy, Prezcobix, escitalopram, ezetimibe, valACYclovir, ampicillin, atorvastatin, docusate sodium, methocarbamol, metoprolol succinate, pantoprazole, oxyCODONE, sulfamethoxazole-trimethoprim, tamsulosin, potassium chloride SA, Savaysa, Ampicillin Sodium, alfuzosin, and torsemide.  Meds ordered this encounter  Medications  . torsemide (DEMADEX) 10 MG tablet    Sig: Take 1 tablet (10 mg total) by mouth daily.    Dispense:  90 tablet    Refill:  0   I spent 50 minutes in preparing to see the patient by review of recent labs, imaging and procedures, obtaining and reviewing separately obtained history, communicating with the patient and family or caregiver, ordering medications, tests or procedures, and documenting clinical information in the EHR including the differential Dx, treatment, and any further evaluation and other management of 1. Thiamine deficiency neuropathy 2. Bilateral leg edema 3. Diuretic-induced hypokalemia 4. Ecchymoses, spontaneous 5. Iron deficiency anemia due to dietary causes 6. Essential hypertension 7. Diastolic dysfunction with acute on chronic heart failure ([place])    Follow-up: Return in about 3 months (around 03/09/2020).  [name], MD

## 2019-12-08 NOTE — Progress Notes (Signed)
Urological Symptom Review  Patient is experiencing the following symptoms: Frequent urination Hard to postpone urination Get up at night to urinate Leakage of urine Erection problems (male only)   Review of Systems  Gastrointestinal (upper)  : Negative for upper GI symptoms  Gastrointestinal (lower) : Negative for lower GI symptoms  Constitutional : Negative for symptoms  Skin: Negative for skin symptoms  Eyes: Negative for eye symptoms  Ear/Nose/Throat : Negative for Ear/Nose/Throat symptoms  Hematologic/Lymphatic: Negative for Hematologic/Lymphatic symptoms  Cardiovascular : Leg swelling  Respiratory : Negative for respiratory symptoms  Endocrine: Negative for endocrine symptoms  Musculoskeletal: Negative for musculoskeletal symptoms  Neurological: Negative for neurological symptoms  Psychologic: Depression

## 2019-12-08 NOTE — Progress Notes (Signed)
12/08/2019 8:54 AM   [name] 03/02/1951 [id number]  Referring provider: [name], MD [place],  [place] [address]  Weak stream  HPI: Mr Matsumoto is a 69yo here for followup for BPH and weak urinary stream. Since last visit his nocturia is stable at 2x. Stream is stronger. Frequency has improved. No dribbling. He leaks a couple of drops prior to urinating. He is on flomax 0.'4mg'$  daily. He has new diarrhea since starting the flomax.   PMH: Past Medical History:  Diagnosis Date  . Allergy   . Anxiety   . Carotid artery occlusion    40-60% right ICA stenosis (09/2008)  . Cataract   . Chronic back pain   . CLL (chronic lymphoblastic leukemia) dx 2010   Followed at [facility] q14mo no current therapy   . Clotting disorder ([facility]   . Coronary artery disease 2010   s/p CABG '10, sees Dr. HPercival Spanish . Depression   . Diverticulosis   . DVT, lower extremity, recurrent ([facility] 2008, 2009   LLE, chronic anticoag since 2009  . Esophagitis   . Fibromyalgia   . Gallstones   . GERD (gastroesophageal reflux disease)   . Gout   . Gynecomastia, male   . H/O hiatal hernia 2008   surgery  . Hemorrhoids   . Hepatitis A yrs ago  . HIV infection ([facility] dx 1993  . Hypertension   . Impotence of organic origin   . Myocardial infarction ([facility] 2010    x 2  . Neuromuscular disorder (HCC)    neuropathy  . Osteoarthritis, knee    s/p B TKA  . Osteoporosis   . PONV (postoperative nausea and vomiting)   . Pulmonary fibrosis ([facility] 2017  . Rheumatoid arthritis(714.0) dx 2010   MTX, follows with rheum  . Seasonal allergies   . Secondary syphilis 07/24/14 dx   s/p 2 wks doxy  . Status post dilation of esophageal narrowing   . Stroke ([facility] 1969   TIA  . TIA (transient ischemic attack) 1997   mild residual L mouth droop  . Tubular adenoma of colon     Surgical History: Past Surgical History:  Procedure Laterality Date  . ACETABULAR REVISION Left 11/03/2019   REVISION LEFT  HIP ACETABULAR COMPONENT (Left Hip)  . ANTERIOR HIP REVISION Left 08/17/2018   Procedure: ANTERIOR LEFT HIP REVISION;  Surgeon: BMcarthur Rossetti MD;  [facility]  Service: Orthopedics;  Laterality: Left;  . ANTERIOR HIP REVISION Left 11/02/2018   Procedure: LEFT HIP CONSTRAINED LINER REVISION;  Surgeon: BMcarthur Rossetti MD;  [facility]  Service: Orthopedics;  Laterality: Left;  . ANTERIOR HIP REVISION Left 11/03/2019   Procedure: REVISION LEFT HIP ACETABULAR COMPONENT;  Surgeon: BMcarthur Rossetti MD;  [facility]  Service: Orthopedics;  Laterality: Left;  . BACK SURGERY  2010 & 2017  . CHOLECYSTECTOMY    . COLONOSCOPY WITH PROPOFOL N/A 12/28/2012   Procedure: COLONOSCOPY WITH PROPOFOL;  Surgeon: JJerene Bears MD;  [facility];  Service: Gastroenterology;  Laterality: N/A;  . CORONARY ARTERY BYPASS GRAFT  2010   triple bypass  . ESOPHAGOGASTRODUODENOSCOPY (EGD) WITH PROPOFOL N/A 12/28/2012   Procedure: ESOPHAGOGASTRODUODENOSCOPY (EGD) WITH PROPOFOL;  Surgeon: JJerene Bears MD;  [facility];  Service: Gastroenterology;  Laterality: N/A;  . ESOPHAGOGASTRODUODENOSCOPY (EGD) WITH PROPOFOL N/A 03/15/2013   Procedure: ESOPHAGOGASTRODUODENOSCOPY (EGD) WITH PROPOFOL;  Surgeon: JJerene Bears MD;  [facility];  Service: Gastroenterology;  Laterality: N/A;  . ESOPHAGOGASTRODUODENOSCOPY (EGD) WITH PROPOFOL N/A 02/07/2016   Procedure: ESOPHAGOGASTRODUODENOSCOPY (EGD) WITH PROPOFOL;  Surgeon: Beverley Fiedler, MD;  [facility];  Service: Gastroenterology;  Laterality: N/A;  . HARDWARE REMOVAL N/A 07/02/2012   Procedure: HARDWARE REMOVAL;  Surgeon: Mariam Dollar, MD;  [facility];  Service: Neurosurgery;  Laterality: N/A;  . HIATAL HERNIA REPAIR     wrap  . HIP CLOSED REDUCTION Left 08/15/2018   Procedure: CLOSED REDUCTION HIP;  Surgeon: Nadara Mustard, MD;  Location: [facility];  Service: Orthopedics;  Laterality: Left;  . HIP CLOSED  REDUCTION Left 08/28/2018   Procedure: CLOSED REDUCTION HIP FOR RECURRENT DISLOCATION AND DRESSING CHANGE;  Surgeon: Kerrin Champagne, MD;  [facility];  Service: Orthopedics;  Laterality: Left;  . HIP CLOSED REDUCTION Left 09/15/2018   Procedure: CLOSED REDUCTION HIP DISLOCATION;  Surgeon: Kathryne Hitch, MD;  [facility];  Service: Orthopedics;  Laterality: Left;  . HIP CLOSED REDUCTION Left 10/31/2018   Procedure: CLOSED REDUCTION LEFT TOTAL HIP;  Surgeon: Tarry Kos, MD;  [facility];  Service: Orthopedics;  Laterality: Left;  . INGUINAL HERNIA REPAIR Bilateral   . JOINT REPLACEMENT Left 1999  . KNEE ARTHROPLASTY  07/22/2011   Procedure: COMPUTER ASSISTED TOTAL KNEE ARTHROPLASTY;  Surgeon: Cammy Copa, MD;  Location: [facility] OR;  Service: Orthopedics;  Laterality: Right;  Right total knee arthroplasty  . MANDIBLE SURGERY Bilateral    tmj  . REPLACEMENT TOTAL KNEE Bilateral   . ring around testicle hernia reapir  184 and 1986   x 2  . ROTATOR CUFF REPAIR Right   . SHOULDER SURGERY Left   . SPINE SURGERY  2010   "rod and screws", "failed", lopwer spine,   . stent to heart x 1  2010  . TONSILLECTOMY    . TOTAL HIP ARTHROPLASTY Left 08/13/2018   Procedure: LEFT TOTAL HIP ARTHROPLASTY ANTERIOR APPROACH;  Surgeon: Kathryne Hitch, MD;  [facility];  Service: Orthopedics;  Laterality: Left;  . TOTAL HIP ARTHROPLASTY Left 09/17/2018   Procedure: ANTERIOR LEFT HIP REVISION;  Surgeon: Kathryne Hitch, MD;  Location: [facility] OR;  Service: Orthopedics;  Laterality: Left;  . UMBILICAL HERNIA REPAIR     x 1  . varicose vein     stripping  . VIDEO BRONCHOSCOPY Bilateral 10/16/2015   Procedure: VIDEO BRONCHOSCOPY WITHOUT FLUORO;  Surgeon: Kalman Shan, MD;  [facility];  Service: Cardiopulmonary;  Laterality: Bilateral;    Home Medications:  Allergies as of 12/08/2019      Reactions   Golimumab Anaphylaxis   Simponi ARIA   Orencia [abatacept]  Anaphylaxis   Other Anaphylaxis, Hives   Pecans   Peanut-containing Drug Products Anaphylaxis, Hives, Swelling   Swelling of throat   Morphine Other (See Comments)   Severe headache- Can tolerate Dilaudid, however   Oxycodone-acetaminophen Other (See Comments)   Headache 6.22.2020 patient is currently taking and tolerating   Promethazine Hcl Other (See Comments)   Makes him feel "drunk" at higher strengths      Medication List       Accurate as of December 08, 2019  8:54 AM. If you have any questions, ask your nurse or doctor.        acetaminophen 500 MG tablet Commonly known as: TYLENOL Take 500-1,000 mg by mouth every 6 (six) hours as needed for mild pain or moderate pain.   ampicillin  IVPB Inject 12 g into the vein  daily. Inject 12 g into the vein daily. As a continuous infusion  Indication: hip infection First Dose: Yes  Last Day of Therapy: 12/20/2019  Labs - Once weekly: CBC/D and BMP,  Labs - Every other week: ESR and CRP  Method of administration: Ambulatory Pump (Continuous Infusion)  Method of administration may be changed at the discretion of home infusion pharmacist based upon assessment of the patient and/or caregiver's ability to self-administer the medication ordered   Ampicillin Sodium 10 g Solr   atorvastatin 10 MG tablet Commonly known as: LIPITOR Take 1 tablet (10 mg total) by mouth at bedtime.   Biktarvy 50-200-25 MG Tabs tablet Generic drug: bictegravir-emtricitabine-tenofovir AF Take 1 tablet by mouth daily.   docusate sodium 100 MG capsule Commonly known as: COLACE Take 1 capsule (100 mg total) by mouth 2 (two) times daily.   EpiPen 2-Pak 0.3 mg/0.3 mL Soaj injection Generic drug: EPINEPHrine Inject 0.3 mg into the muscle as needed for anaphylaxis.   escitalopram 20 MG tablet Commonly known as: LEXAPRO Take 1 tablet (20 mg total) by mouth daily.   ezetimibe 10 MG tablet Commonly known as: ZETIA Take 1 tablet (10 mg total) by mouth  daily. What changed: when to take this   folic acid 1 MG tablet Commonly known as: FOLVITE Take 1 mg by mouth daily.   l-methylfolate-B6-B12 3-35-2 MG Tabs tablet Commonly known as: METANX Take 1 tablet by mouth 2 (two) times daily.   leflunomide 20 MG tablet Commonly known as: ARAVA Take 20 mg by mouth daily.   methocarbamol 500 MG tablet Commonly known as: ROBAXIN Take 1 tablet (500 mg total) by mouth every 6 (six) hours as needed for muscle spasms.   metoprolol succinate 50 MG 24 hr tablet Commonly known as: TOPROL-XL Take 1 tablet (50 mg total) by mouth daily. Take with or immediately following a meal.   nitroGLYCERIN 0.4 MG SL tablet Commonly known as: NITROSTAT Place 1 tablet (0.4 mg total) under the tongue every 5 (five) minutes as needed for chest pain.   ondansetron 4 MG tablet Commonly known as: ZOFRAN Take 4 mg by mouth every 6 (six) hours as needed for nausea.   oxyCODONE 5 MG immediate release tablet Commonly known as: Oxy IR/ROXICODONE Take 1-2 tablets (5-10 mg total) by mouth every 4 (four) hours as needed for moderate pain (pain score 4-6).   pantoprazole 40 MG tablet Commonly known as: PROTONIX Take 1 tablet (40 mg total) by mouth daily.   potassium chloride SA 20 MEQ tablet Commonly known as: KLOR-CON Take 1 tablet (20 mEq total) by mouth 3 (three) times daily.   predniSONE 5 MG tablet Commonly known as: DELTASONE Take 5 mg by mouth 2 (two) times daily.   pregabalin 200 MG capsule Commonly known as: LYRICA TAKE 1 CAPSULE BY MOUTH TWICE A DAY   Prezcobix 800-150 MG tablet Generic drug: darunavir-cobicistat Take 1 tablet by mouth daily with breakfast.   Savaysa 30 MG Tabs tablet Generic drug: edoxaban TAKE 1 TABLET (30 MG TOTAL) BY MOUTH DAILY.   sulfamethoxazole-trimethoprim 800-160 MG tablet Commonly known as: BACTRIM DS Take 1 tablet by mouth daily.   sulfaSALAzine 500 MG tablet Commonly known as: AZULFIDINE Take 500 mg by mouth in  the morning and at bedtime.   tamsulosin 0.4 MG Caps capsule Commonly known as: FLOMAX TAKE 1 CAPSULE (0.4 MG TOTAL) BY MOUTH DAILY AFTER SUPPER.   torsemide 10 MG tablet Commonly known as: DEMADEX Take 1 tablet (10 mg total) by  mouth daily.   valACYclovir 500 MG tablet Commonly known as: VALTREX TAKE 1 TABLET BY MOUTH EVERY DAY       Allergies:  Allergies  Allergen Reactions  . Golimumab Anaphylaxis    Simponi ARIA  . Orencia [Abatacept] Anaphylaxis  . Other Anaphylaxis and Hives    Pecans  . Peanut-Containing Drug Products Anaphylaxis, Hives and Swelling    Swelling of throat  . Morphine Other (See Comments)    Severe headache- Can tolerate Dilaudid, however  . Oxycodone-Acetaminophen Other (See Comments)    Headache 6.22.2020 patient is currently taking and tolerating  . Promethazine Hcl Other (See Comments)    Makes him feel "drunk" at higher strengths    Family History: Family History  Problem Relation Age of Onset  . Breast cancer Mother   . Hypertension Mother   . Hyperlipidemia Mother   . Diabetes Mother   . Prostate cancer Father   . Colon polyps Father   . Hyperlipidemia Father   . Crohn's disease Paternal Aunt   . Diabetes Maternal Grandmother   . Diabetes Brother        x 3  . Heart attack Brother   . Heart disease Brother        x 3  . Heart attack Brother   . Hyperlipidemia Brother        x 3  . Alcohol abuse Daughter   . Drug abuse Daughter   . Asthma Brother   . CVA Brother   . Colon cancer Neg Hx   . Esophageal cancer Neg Hx   . Rectal cancer Neg Hx   . Stomach cancer Neg Hx     Social History:  reports that he has never smoked. He has never used smokeless tobacco. He reports previous alcohol use. He reports previous drug use. Drug: Marijuana.  ROS: All other review of systems were reviewed and are negative except what is noted above in HPI  Physical Exam: BP (!) 95/55   Pulse 82   Temp 98.1 F (36.7 C)   Constitutional:   Alert and oriented, No acute distress. HEENT:  AT, moist mucus membranes.  Trachea midline, no masses. Cardiovascular: No clubbing, cyanosis, or edema. Respiratory: Normal respiratory effort, no increased work of breathing. GI: Abdomen is soft, nontender, nondistended, no abdominal masses GU: No CVA tenderness.  Lymph: No cervical or inguinal lymphadenopathy. Skin: No rashes, bruises or suspicious lesions. Neurologic: Grossly intact, no focal deficits, moving all 4 extremities. Psychiatric: Normal mood and affect.  Laboratory Data: Lab Results  Component Value Date   WBC 9.8 11/11/2019   HGB 7.8 (L) 11/11/2019   HCT 25.2 (L) 11/11/2019   MCV 114.5 (H) 11/11/2019   PLT 107 (L) 11/11/2019    Lab Results  Component Value Date   CREATININE 0.71 11/11/2019    Lab Results  Component Value Date   PSA 1.2 09/21/2019    Lab Results  Component Value Date   TESTOSTERONE 336 07/22/2017    Lab Results  Component Value Date   HGBA1C 4.4 (L) 09/15/2019    Urinalysis    Component Value Date/Time   COLORURINE YELLOW 11/16/2018 1526   APPEARANCEUR CLEAR 11/16/2018 1526   LABSPEC >=1.030 (A) 11/16/2018 1526   PHURINE 6.0 11/16/2018 1526   GLUCOSEU NEGATIVE 11/16/2018 [place] 11/16/2018 1526   BILIRUBINUR neg 09/21/2019 [place] 11/16/2018 1526   PROTEINUR Negative 09/21/2019 [place] 09/26/2018 2011   UROBILINOGEN negative (  A) 09/21/2019 1006   UROBILINOGEN 1.0 11/16/2018 1526   NITRITE neg 09/21/2019 1006   NITRITE NEGATIVE 11/16/2018 1526   LEUKOCYTESUR Negative 09/21/2019 [place] 11/16/2018 1526    Lab Results  Component Value Date   MUCUS [place] of (A) 11/16/2018   BACTERIA RARE (A) 05/13/2016    Pertinent Imaging:  No results found for this or any previous visit.  No results found for this or any previous visit.  No results found for this or any previous visit.  No results found for  this or any previous visit.  No results found for this or any previous visit.  No results found for this or any previous visit.  No results found for this or any previous visit.  No results found for this or any previous visit.   Assessment & Plan:    1. Benign prostatic hyperplasia, unspecified whether lower urinary tract symptoms present -We will trial uroxatral '10mg'$  - Urinalysis, Routine w reflex microscopic  2. Weak urinary stream -uroxatral '10mg'$ . Stop flomax   No follow-ups on file.  Nicolette Bang, MD  [facility] [place]

## 2019-12-08 NOTE — Patient Instructions (Signed)

## 2019-12-08 NOTE — Patient Instructions (Signed)

## 2019-12-09 ENCOUNTER — Encounter: Payer: Self-pay | Admitting: Internal Medicine

## 2019-12-09 ENCOUNTER — Encounter: Payer: Self-pay | Admitting: Orthopaedic Surgery

## 2019-12-09 ENCOUNTER — Encounter: Payer: Self-pay | Admitting: Urology

## 2019-12-09 ENCOUNTER — Other Ambulatory Visit (HOSPITAL_COMMUNITY)
Admission: RE | Admit: 2019-12-09 | Discharge: 2019-12-09 | Disposition: A | Discharge status: Home / Self Care | Payer: Medicare Other | Source: Ambulatory Visit | Attending: Internal Medicine | Admitting: Internal Medicine

## 2019-12-09 DIAGNOSIS — Z01812 Encounter for preprocedural laboratory examination: Secondary | ICD-10-CM | POA: Insufficient documentation

## 2019-12-09 DIAGNOSIS — Z20822 Contact with and (suspected) exposure to covid-19: Secondary | ICD-10-CM | POA: Diagnosis not present

## 2019-12-09 LAB — HEPATIC FUNCTION PANEL
AG Ratio: 2.2 (calc) (ref 1.0–2.5)
ALT: 14 U/L (ref 9–46)
AST: 24 U/L (ref 10–35)
Albumin: 3.7 g/dL (ref 3.6–5.1)
Alkaline phosphatase (APISO): 81 U/L (ref 35–144)
Bilirubin, Direct: 0.1 mg/dL (ref 0.0–0.2)
Globulin: 1.7 g/dL (calc) — ABNORMAL LOW (ref 1.9–3.7)
Indirect Bilirubin: 0.3 mg/dL (calc) (ref 0.2–1.2)
Total Bilirubin: 0.4 mg/dL (ref 0.2–1.2)
Total Protein: 5.4 g/dL — ABNORMAL LOW (ref 6.1–8.1)

## 2019-12-09 LAB — CBC WITH DIFFERENTIAL/PLATELET
Absolute Monocytes: 2486 cells/uL — ABNORMAL HIGH (ref 200–950)
Basophils Absolute: 33 cells/uL (ref 0–200)
Basophils Relative: 0.3 %
Eosinophils Absolute: 33 cells/uL (ref 15–500)
Eosinophils Relative: 0.3 %
HCT: 31.9 % — ABNORMAL LOW (ref 38.5–50.0)
Hemoglobin: 10.4 g/dL — ABNORMAL LOW (ref 13.2–17.1)
Lymphs Abs: 6882 cells/uL — ABNORMAL HIGH (ref 850–3900)
MCH: 35.6 pg — ABNORMAL HIGH (ref 27.0–33.0)
MCHC: 32.6 g/dL (ref 32.0–36.0)
MCV: 109.2 fL — ABNORMAL HIGH (ref 80.0–100.0)
MPV: 9.7 fL (ref 7.5–12.5)
Monocytes Relative: 22.4 %
Neutro Abs: 1665 cells/uL (ref 1500–7800)
Neutrophils Relative %: 15 %
Platelets: 107 10*3/uL — ABNORMAL LOW (ref 140–400)
RBC: 2.92 10*6/uL — ABNORMAL LOW (ref 4.20–5.80)
RDW: 14.6 % (ref 11.0–15.0)
Total Lymphocyte: 62 %
WBC: 11.1 10*3/uL — ABNORMAL HIGH (ref 3.8–10.8)

## 2019-12-09 LAB — APTT: aPTT: 25 s (ref 23–32)

## 2019-12-09 LAB — PROTIME-INR
INR: 1
Prothrombin Time: 10.9 s (ref 9.0–11.5)

## 2019-12-09 LAB — BASIC METABOLIC PANEL WITH GFR
BUN: 15 mg/dL (ref 7–25)
CO2: 30 mmol/L (ref 20–32)
Calcium: 8.1 mg/dL — ABNORMAL LOW (ref 8.6–10.3)
Chloride: 102 mmol/L (ref 98–110)
Creat: 0.89 mg/dL (ref 0.70–1.18)
GFR, Est African American: 100 mL/min/{1.73_m2} (ref >=60)
GFR, Est Non African American: 86 mL/min/{1.73_m2} (ref >=60)
Glucose, Bld: 118 mg/dL — ABNORMAL HIGH (ref 65–99)
Potassium: 3.3 mmol/L — ABNORMAL LOW (ref 3.5–5.3)
Sodium: 141 mmol/L (ref 135–146)

## 2019-12-09 LAB — IRON: Iron: 73 ug/dL (ref 50–180)

## 2019-12-09 LAB — FERRITIN: Ferritin: 155 ng/mL (ref 24–380)

## 2019-12-09 LAB — SARS CORONAVIRUS 2 (TAT 6-24 HRS): SARS Coronavirus 2: NEGATIVE

## 2019-12-09 LAB — TSH: TSH: 1.12 mIU/L (ref 0.40–4.50)

## 2019-12-09 LAB — MAGNESIUM: Magnesium: 2.1 mg/dL (ref 1.5–2.5)

## 2019-12-09 MED ORDER — CLOTRIMAZOLE-BETAMETHASONE 1-0.05 % EX CREA: 1.0000 "application " | TOPICAL_CREAM | Freq: Two times a day (BID) | CUTANEOUS | 0 refills | Status: DC

## 2019-12-10 DIAGNOSIS — I5033 Acute on chronic diastolic (congestive) heart failure: Secondary | ICD-10-CM | POA: Insufficient documentation

## 2019-12-12 ENCOUNTER — Ambulatory Visit (INDEPENDENT_AMBULATORY_CARE_PROVIDER_SITE_OTHER): Payer: Medicare Other | Admitting: Internal Medicine

## 2019-12-12 ENCOUNTER — Other Ambulatory Visit: Payer: Self-pay

## 2019-12-12 DIAGNOSIS — J849 Interstitial pulmonary disease, unspecified: Secondary | ICD-10-CM | POA: Diagnosis not present

## 2019-12-12 NOTE — Progress Notes (Signed)
Spirometry and Dlco done today. 

## 2019-12-13 DIAGNOSIS — T8452XD Infection and inflammatory reaction due to internal left hip prosthesis, subsequent encounter: Secondary | ICD-10-CM | POA: Diagnosis not present

## 2019-12-13 DIAGNOSIS — E1142 Type 2 diabetes mellitus with diabetic polyneuropathy: Secondary | ICD-10-CM | POA: Diagnosis not present

## 2019-12-13 DIAGNOSIS — Z452 Encounter for adjustment and management of vascular access device: Secondary | ICD-10-CM | POA: Diagnosis not present

## 2019-12-13 DIAGNOSIS — I2510 Atherosclerotic heart disease of native coronary artery without angina pectoris: Secondary | ICD-10-CM | POA: Diagnosis not present

## 2019-12-13 DIAGNOSIS — T84031D Mechanical loosening of internal left hip prosthetic joint, subsequent encounter: Secondary | ICD-10-CM | POA: Diagnosis not present

## 2019-12-13 DIAGNOSIS — D62 Acute posthemorrhagic anemia: Secondary | ICD-10-CM | POA: Diagnosis not present

## 2019-12-13 DIAGNOSIS — B952 Enterococcus as the cause of diseases classified elsewhere: Secondary | ICD-10-CM | POA: Diagnosis not present

## 2019-12-14 ENCOUNTER — Encounter: Payer: Self-pay | Admitting: Orthopaedic Surgery

## 2019-12-14 ENCOUNTER — Ambulatory Visit (INDEPENDENT_AMBULATORY_CARE_PROVIDER_SITE_OTHER): Payer: Medicare Other | Admitting: Orthopaedic Surgery

## 2019-12-14 ENCOUNTER — Ambulatory Visit (INDEPENDENT_AMBULATORY_CARE_PROVIDER_SITE_OTHER): Payer: Medicare Other

## 2019-12-14 DIAGNOSIS — Z96649 Presence of unspecified artificial hip joint: Secondary | ICD-10-CM

## 2019-12-14 NOTE — Progress Notes (Signed)
[name] comes in today for me to x-ray his hip again.  We have now allowed weightbearing and I want to see what the implant looks like given the multiple surgeries and dislocations he is had.  He now has a dual mobility acetabular component.  He says he has been doing great from a weightbearing standpoint.  However, he is dealing with a draining hip wound.  He is on IV antibiotics and we are trying to treat this aggressively.  On examination his hip moves smoothly and x-rays also confirm a well-seated hip replacement in the acetabulum [name] is in good position thus far.  He does have a draining sinus.  I cleaned this area with Betadine and alcohol and after aspirating a lot of fluid from the area using #15 blade and ellipsed out the sinus tract and closed with some nylon in an effort to hopefully keep the soft tissue and skin in a better position because of his difficulty with changing dressings as well to keep getting saturated.  Of note there is no evidence cellulitis and his hip does move smoothly.  I would like to see him back in a week for wound check.  All question concerns were answered and addressed.

## 2019-12-15 DIAGNOSIS — M79672 Pain in left foot: Secondary | ICD-10-CM | POA: Diagnosis not present

## 2019-12-15 DIAGNOSIS — L84 Corns and callosities: Secondary | ICD-10-CM | POA: Diagnosis not present

## 2019-12-15 DIAGNOSIS — B351 Tinea unguium: Secondary | ICD-10-CM | POA: Diagnosis not present

## 2019-12-15 DIAGNOSIS — G609 Hereditary and idiopathic neuropathy, unspecified: Secondary | ICD-10-CM | POA: Diagnosis not present

## 2019-12-16 ENCOUNTER — Other Ambulatory Visit: Payer: Self-pay | Admitting: Urology

## 2019-12-17 DIAGNOSIS — K5790 Diverticulosis of intestine, part unspecified, without perforation or abscess without bleeding: Secondary | ICD-10-CM | POA: Diagnosis not present

## 2019-12-17 DIAGNOSIS — Z96653 Presence of artificial knee joint, bilateral: Secondary | ICD-10-CM | POA: Diagnosis not present

## 2019-12-17 DIAGNOSIS — Z8673 Personal history of transient ischemic attack (TIA), and cerebral infarction without residual deficits: Secondary | ICD-10-CM | POA: Diagnosis not present

## 2019-12-17 DIAGNOSIS — E785 Hyperlipidemia, unspecified: Secondary | ICD-10-CM | POA: Diagnosis not present

## 2019-12-17 DIAGNOSIS — B20 Human immunodeficiency virus [HIV] disease: Secondary | ICD-10-CM | POA: Diagnosis not present

## 2019-12-17 DIAGNOSIS — M069 Rheumatoid arthritis, unspecified: Secondary | ICD-10-CM | POA: Diagnosis not present

## 2019-12-17 DIAGNOSIS — C9110 Chronic lymphocytic leukemia of B-cell type not having achieved remission: Secondary | ICD-10-CM | POA: Diagnosis not present

## 2019-12-17 DIAGNOSIS — T84031D Mechanical loosening of internal left hip prosthetic joint, subsequent encounter: Secondary | ICD-10-CM | POA: Diagnosis not present

## 2019-12-17 DIAGNOSIS — Z452 Encounter for adjustment and management of vascular access device: Secondary | ICD-10-CM | POA: Diagnosis not present

## 2019-12-17 DIAGNOSIS — Z951 Presence of aortocoronary bypass graft: Secondary | ICD-10-CM | POA: Diagnosis not present

## 2019-12-17 DIAGNOSIS — T8452XD Infection and inflammatory reaction due to internal left hip prosthesis, subsequent encounter: Secondary | ICD-10-CM | POA: Diagnosis not present

## 2019-12-17 DIAGNOSIS — J849 Interstitial pulmonary disease, unspecified: Secondary | ICD-10-CM | POA: Diagnosis not present

## 2019-12-17 DIAGNOSIS — F419 Anxiety disorder, unspecified: Secondary | ICD-10-CM | POA: Diagnosis not present

## 2019-12-17 DIAGNOSIS — B952 Enterococcus as the cause of diseases classified elsewhere: Secondary | ICD-10-CM | POA: Diagnosis not present

## 2019-12-17 DIAGNOSIS — Z792 Long term (current) use of antibiotics: Secondary | ICD-10-CM | POA: Diagnosis not present

## 2019-12-17 DIAGNOSIS — G8929 Other chronic pain: Secondary | ICD-10-CM | POA: Diagnosis not present

## 2019-12-17 DIAGNOSIS — M797 Fibromyalgia: Secondary | ICD-10-CM | POA: Diagnosis not present

## 2019-12-17 DIAGNOSIS — Z86718 Personal history of other venous thrombosis and embolism: Secondary | ICD-10-CM | POA: Diagnosis not present

## 2019-12-17 DIAGNOSIS — F329 Major depressive disorder, single episode, unspecified: Secondary | ICD-10-CM | POA: Diagnosis not present

## 2019-12-17 DIAGNOSIS — I10 Essential (primary) hypertension: Secondary | ICD-10-CM | POA: Diagnosis not present

## 2019-12-17 DIAGNOSIS — D62 Acute posthemorrhagic anemia: Secondary | ICD-10-CM | POA: Diagnosis not present

## 2019-12-17 DIAGNOSIS — E1136 Type 2 diabetes mellitus with diabetic cataract: Secondary | ICD-10-CM | POA: Diagnosis not present

## 2019-12-17 DIAGNOSIS — I2510 Atherosclerotic heart disease of native coronary artery without angina pectoris: Secondary | ICD-10-CM | POA: Diagnosis not present

## 2019-12-17 DIAGNOSIS — D689 Coagulation defect, unspecified: Secondary | ICD-10-CM | POA: Diagnosis not present

## 2019-12-17 DIAGNOSIS — E1142 Type 2 diabetes mellitus with diabetic polyneuropathy: Secondary | ICD-10-CM | POA: Diagnosis not present

## 2019-12-18 ENCOUNTER — Encounter: Payer: Self-pay | Admitting: Orthopaedic Surgery

## 2019-12-18 DIAGNOSIS — L7634 Postprocedural seroma of skin and subcutaneous tissue following other procedure: Secondary | ICD-10-CM | POA: Diagnosis not present

## 2019-12-18 DIAGNOSIS — Z96642 Presence of left artificial hip joint: Secondary | ICD-10-CM | POA: Diagnosis not present

## 2019-12-19 ENCOUNTER — Other Ambulatory Visit: Payer: Self-pay | Admitting: Internal Medicine

## 2019-12-19 ENCOUNTER — Telehealth: Payer: Self-pay

## 2019-12-19 DIAGNOSIS — M792 Neuralgia and neuritis, unspecified: Secondary | ICD-10-CM

## 2019-12-19 NOTE — Telephone Encounter (Signed)
Was the PICC line ordered by [place]?

## 2019-12-19 NOTE — Telephone Encounter (Signed)
New message    RN- Aldona Bar calling with questions the patient receiving IV antibiotics will be finishing up tomorrow what's the plan for the PICC line removal.

## 2019-12-20 ENCOUNTER — Telehealth: Payer: Self-pay | Admitting: *Deleted

## 2019-12-20 ENCOUNTER — Telehealth: Payer: Self-pay

## 2019-12-20 ENCOUNTER — Telehealth: Payer: Self-pay | Admitting: Orthopaedic Surgery

## 2019-12-20 DIAGNOSIS — T8452XD Infection and inflammatory reaction due to internal left hip prosthesis, subsequent encounter: Secondary | ICD-10-CM | POA: Diagnosis not present

## 2019-12-20 DIAGNOSIS — T84031D Mechanical loosening of internal left hip prosthetic joint, subsequent encounter: Secondary | ICD-10-CM | POA: Diagnosis not present

## 2019-12-20 DIAGNOSIS — Z452 Encounter for adjustment and management of vascular access device: Secondary | ICD-10-CM | POA: Diagnosis not present

## 2019-12-20 DIAGNOSIS — D62 Acute posthemorrhagic anemia: Secondary | ICD-10-CM | POA: Diagnosis not present

## 2019-12-20 DIAGNOSIS — I2510 Atherosclerotic heart disease of native coronary artery without angina pectoris: Secondary | ICD-10-CM | POA: Diagnosis not present

## 2019-12-20 DIAGNOSIS — E1142 Type 2 diabetes mellitus with diabetic polyneuropathy: Secondary | ICD-10-CM | POA: Diagnosis not present

## 2019-12-20 DIAGNOSIS — B952 Enterococcus as the cause of diseases classified elsewhere: Secondary | ICD-10-CM | POA: Diagnosis not present

## 2019-12-20 NOTE — Telephone Encounter (Signed)
Please pull pic Thanks jeff

## 2019-12-20 NOTE — Telephone Encounter (Signed)
Called Samatha with [facility] and left a detailed message with PCP response. "No, contact ID". Inform Samantha via vm that Infectious Disease ordered the PICC Line and that they need to contact them for the order to remove, etc.

## 2019-12-20 NOTE — Telephone Encounter (Signed)
[name], [facility], calling for pull PICC orders. Patient to complete IV antibiotics 8/10, switch to oral. Per chart, PICC to stay in place until provider notified.  Please advise. Landis Gandy, RN

## 2019-12-20 NOTE — Telephone Encounter (Signed)
Left voicemail at [facility] with pull picc orders per Dr [name] and left our phone number to call. Landis Gandy, RN

## 2019-12-20 NOTE — Telephone Encounter (Signed)
It is ok to call and have them remove the PICC line.

## 2019-12-20 NOTE — Telephone Encounter (Signed)
Samantha-nurse with [facility] called advised patient will finish his IV antibiotics this afternoon and asked if it's ok for the picc line to be removed? The number to contact Aldona Bar is [phone]

## 2019-12-20 NOTE — Telephone Encounter (Signed)
Nurse Aldona Bar aware this ok to remove

## 2019-12-20 NOTE — Telephone Encounter (Signed)
Please advise 

## 2019-12-20 NOTE — Telephone Encounter (Signed)
New message:   Aldona Bar is calling from [facility] and I have given her the response from Dr. Ronnald Ramp but she would still like a call back. Please advise.

## 2019-12-20 NOTE — Telephone Encounter (Signed)
Received call from patient requesting for his home health nurse to pull his picc ine while she is currently in the home.  Per last video visit antibiotics end today, no orders noted to pull picc line at therapy completion. Routing to provider for advise.  Eugenia Mcalpine

## 2019-12-21 ENCOUNTER — Ambulatory Visit: Payer: Medicare Other | Admitting: Orthopaedic Surgery

## 2019-12-21 ENCOUNTER — Telehealth: Payer: Self-pay

## 2019-12-21 DIAGNOSIS — Z452 Encounter for adjustment and management of vascular access device: Secondary | ICD-10-CM | POA: Diagnosis not present

## 2019-12-21 DIAGNOSIS — T8452XD Infection and inflammatory reaction due to internal left hip prosthesis, subsequent encounter: Secondary | ICD-10-CM | POA: Diagnosis not present

## 2019-12-21 DIAGNOSIS — D62 Acute posthemorrhagic anemia: Secondary | ICD-10-CM | POA: Diagnosis not present

## 2019-12-21 DIAGNOSIS — B952 Enterococcus as the cause of diseases classified elsewhere: Secondary | ICD-10-CM | POA: Diagnosis not present

## 2019-12-21 DIAGNOSIS — T84031D Mechanical loosening of internal left hip prosthetic joint, subsequent encounter: Secondary | ICD-10-CM | POA: Diagnosis not present

## 2019-12-21 DIAGNOSIS — E1142 Type 2 diabetes mellitus with diabetic polyneuropathy: Secondary | ICD-10-CM | POA: Diagnosis not present

## 2019-12-21 NOTE — Telephone Encounter (Signed)
Home health nurse did not get order to pull PICC. RN called [facility] again, relayed verbal order per Dr [name] to [place].  Order repeated and verified. Ronalee Belts states they had not yet checked the voicemail. Landis Gandy, RN

## 2019-12-21 NOTE — Telephone Encounter (Signed)
Patient calling for update regarding picc line removal and transitioning to oral antibiotic therapy. Confirmed with patient an an order to pull picc line has been received and called to [facility]. Routing message to provider regarding oral antibiotic therapy. Eugenia Mcalpine

## 2019-12-22 ENCOUNTER — Encounter: Payer: Self-pay | Admitting: Adult Health

## 2019-12-22 ENCOUNTER — Encounter: Payer: Self-pay | Admitting: Orthopaedic Surgery

## 2019-12-22 ENCOUNTER — Ambulatory Visit (INDEPENDENT_AMBULATORY_CARE_PROVIDER_SITE_OTHER): Payer: Medicare Other | Admitting: Adult Health

## 2019-12-22 ENCOUNTER — Ambulatory Visit (INDEPENDENT_AMBULATORY_CARE_PROVIDER_SITE_OTHER): Payer: Medicare Other | Admitting: Orthopaedic Surgery

## 2019-12-22 ENCOUNTER — Other Ambulatory Visit: Payer: Self-pay

## 2019-12-22 DIAGNOSIS — I2510 Atherosclerotic heart disease of native coronary artery without angina pectoris: Secondary | ICD-10-CM

## 2019-12-22 DIAGNOSIS — M87052 Idiopathic aseptic necrosis of left femur: Secondary | ICD-10-CM

## 2019-12-22 DIAGNOSIS — Z96649 Presence of unspecified artificial hip joint: Secondary | ICD-10-CM

## 2019-12-22 DIAGNOSIS — J9611 Chronic respiratory failure with hypoxia: Secondary | ICD-10-CM

## 2019-12-22 DIAGNOSIS — J849 Interstitial pulmonary disease, unspecified: Secondary | ICD-10-CM | POA: Diagnosis not present

## 2019-12-22 DIAGNOSIS — Z96642 Presence of left artificial hip joint: Secondary | ICD-10-CM

## 2019-12-22 MED ORDER — AMOXICILLIN 875 MG PO TABS: 875.0000 mg | ORAL_TABLET | Freq: Two times a day (BID) | ORAL | 0 refills | Status: DC

## 2019-12-22 NOTE — Telephone Encounter (Signed)
Per Dr. [name], verbal orders given for amoxicillin 875mg  BID for 30 days. To follow up after completion of medication. Patient notified.   Saki Legore Lorita Officer, RN

## 2019-12-22 NOTE — Assessment & Plan Note (Signed)
Interstitial lung disease-with associated rheumatoid arthritis pulmonary function testing shows a slight drop in lung function with increased restriction since 2017.  DLCO remains stable.  Check high-resolution CT chest.  Patient has a component of deconditioning.  Now oxygen dependent nocturnally.  Suspect he has some progressive changes.  Patient has significant immunosuppression with associated rheumatoid arthritis chronic immunosuppression drugs along with HIV and CLL.  Will have patient return with high-resolution CT chest in 2 months with Dr. Chase Caller.  Continue activity as tolerated.  Plan  Patient Instructions  Set up HRCT Chest .  Continue on Oxygen 2/m  Activity as tolerated.  Follow up with Dr. Chase Caller in 2 months and As needed

## 2019-12-22 NOTE — Telephone Encounter (Signed)
Patient called to follow up on oral abx therapy. Expressed concern that he has not been started on anything since his PICC has been pulled. Forwarding to provider for orders.  Breton Berns Lorita Officer, RN

## 2019-12-22 NOTE — Progress Notes (Signed)
The patient comes in today for continued follow-up status post multiple operations on the left hip due to failure of the hip arthroplasty.  He has been on 6 weeks of IV antibiotics now the PICC line has been removed and is discharging on oral antibiotics.  On examination there is no redness about the incision but there is still some drainage.  This is thin drainage.  I did aspirate as much fluid from the hip as I could.  I would like to see him back in 4 days.  At that visit I like a supine AP pelvis.  I may consider at some point taking her back for repeat I&D of the hip but, if we can still just draw fluid off of the hip that would be preferred.

## 2019-12-22 NOTE — Assessment & Plan Note (Signed)
Newly diagnosed nocturnal hypoxemia on recent overnight oximetry test.  Patient is doing well on 2 L of oxygen at bedtime.  No changes

## 2019-12-22 NOTE — Patient Instructions (Addendum)
Set up HRCT Chest .  Continue on Oxygen 2/m  Activity as tolerated.  Follow up with Dr. Chase Caller in 2 months and As needed

## 2019-12-22 NOTE — Progress Notes (Addendum)
@Patient  ID: [name], male    DOB: [birth year], 69 y.o.   MRN: [id number]  Chief Complaint  Patient presents with  . Follow-up    ILD     Referring provider: [name], MD  HPI: 69 year old male never  smoker followed for interstitial lung disease.  Patient has underlying rheumatoid arthritis and is immunosuppressed with underlying HIV and CLL.  Followed by rheumatology is on Arava and prednisone 5 mg daily for rheumatoid arthritis Medical history significant for HIV and CLL, coronary artery disease, diabetes   TEST/EVENTS :  PFT September 2017 FEV1 98%, ratio 78, FVC 93%, DLCO 65%   12/22/2019 Follow up : ILD  Patient presents for a 67-month follow-up.  Patient was seen last visit to reestablish for underlying interstitial lung disease.  Patient has multiple comorbidities including rheumatoid arthritis on chronic immunosuppression.  Also HIV and CLL.  Patient was set up for pulmonary function test shows no airflow obstruction or restriction.  Slight drop in FVC since 2017.  FEV1 86%, ratio 77, FVC 81%.  DLCO is stable at 65% similar to 2017.  Patient was set up for high-resolution CT chest unfortunately this is has not been completed today. ONO was positive for desaturations while sleeping . Started on oxygen 2l/m At bedtime  Doing well with this. Sleeps better with oxygen.  Not active , limited by joint issues and shortness of breath . Has power scooter. No cough .   Hip surgery May 2021 , doing some better still painful. Following with ortho. Done with IV antibiotics.    Allergies  Allergen Reactions  . Golimumab Anaphylaxis    Simponi ARIA  . Orencia [Abatacept] Anaphylaxis  . Other Anaphylaxis and Hives    Pecans  . Peanut-Containing Drug Products Anaphylaxis, Hives and Swelling    Swelling of throat  . Morphine Other (See Comments)    Severe headache- Can tolerate Dilaudid, however  . Oxycodone-Acetaminophen Other (See Comments)    Headache 6.22.2020 patient  is currently taking and tolerating  . Promethazine Hcl Other (See Comments)    Makes him feel "drunk" at higher strengths    Immunization History  Administered Date(s) Administered  . Fluad Quad(high Dose 65+) 12/30/2018  . H1N1 04/28/2008  . Hepatitis A, Adult 09/07/2007, 03/10/2008  . Hepatitis B, adult 09/08/2013, 10/27/2013, 03/02/2014  . Influenza Split 01/17/2011, 02/11/2012, 02/18/2013, 02/10/2015  . Influenza, High Dose Seasonal PF 02/02/2014, 01/25/2016, 01/14/2018  . Influenza-Unspecified 02/13/2017  . PFIZER SARS-COV-2 Vaccination 06/23/2019, 07/19/2019  . PPD Test 02/22/2007, 06/12/2008  . Pneumococcal Conjugate-13 08/10/2014  . Pneumococcal Polysaccharide-23 05/13/2003, 12/08/2007, 01/16/2009, 10/27/2013  . Td 11/19/2011  . Zoster 03/02/2014    Past Medical History:  Diagnosis Date  . Allergy   . Anxiety   . Carotid artery occlusion    40-60% right ICA stenosis (09/2008)  . Cataract   . Chronic back pain   . CLL (chronic lymphoblastic leukemia) dx 2010   Followed at [facility] q83mo, no current therapy   . Clotting disorder ([place])   . Coronary artery disease 2010   s/p CABG '10, sees Dr. Percival Spanish  . Depression   . Diverticulosis   . DVT, lower extremity, recurrent ([place]) 2008, 2009   LLE, chronic anticoag since 2009  . Esophagitis   . Fibromyalgia   . Gallstones   . GERD (gastroesophageal reflux disease)   . Gout   . Gynecomastia, male   . H/O hiatal hernia 2008   surgery  . Hemorrhoids   .  Hepatitis A yrs ago  . HIV infection ([place]) dx 1993  . Hypertension   . Impotence of organic origin   . Myocardial infarction ([place]) 2010    x 2  . Neuromuscular disorder ([facility])    neuropathy  . Osteoarthritis, knee    s/p B TKA  . Osteoporosis   . PONV (postoperative nausea and vomiting)   . Pulmonary fibrosis ([place]) 2017  . Rheumatoid arthritis(714.0) dx 2010   MTX, follows with rheum  . Seasonal allergies   . Secondary syphilis 07/24/14 dx   s/p 2 wks doxy  .  Status post dilation of esophageal narrowing   . Stroke [facility]) 1969   TIA  . TIA (transient ischemic attack) 1997   mild residual L mouth droop  . Tubular adenoma of colon     Tobacco History: Social History   Tobacco Use  Smoking Status Never Smoker  Smokeless Tobacco Never Used  Tobacco Comment   occ wine   Counseling given: Not Answered Comment: occ wine   Outpatient Medications Prior to Visit  Medication Sig Dispense Refill  . acetaminophen (TYLENOL) 500 MG tablet Take 500-1,000 mg by mouth every 6 (six) hours as needed for mild pain or moderate pain.     [name] alfuzosin (UROXATRAL) 10 MG 24 hr tablet Take 1 tablet (10 mg total) by mouth daily with breakfast. 30 tablet 11  . atorvastatin (LIPITOR) 10 MG tablet Take 1 tablet (10 mg total) by mouth at bedtime. 30 tablet 9  . bictegravir-emtricitabine-tenofovir AF (BIKTARVY) 50-200-25 MG TABS tablet Take 1 tablet by mouth daily.     . clotrimazole-betamethasone (LOTRISONE) cream Apply 1 application topically 2 (two) times daily. 30 g 0  . darunavir-cobicistat (PREZCOBIX) 800-150 MG tablet Take 1 tablet by mouth daily with breakfast.     . docusate sodium (COLACE) 100 MG capsule Take 1 capsule (100 mg total) by mouth 2 (two) times daily. 10 capsule 0  . EPINEPHrine (EPIPEN 2-PAK) 0.3 mg/0.3 mL IJ SOAJ injection Inject 0.3 mg into the muscle as needed for anaphylaxis.    [name] escitalopram (LEXAPRO) 20 MG tablet Take 1 tablet (20 mg total) by mouth daily. 90 tablet 1  . ezetimibe (ZETIA) 10 MG tablet Take 1 tablet (10 mg total) by mouth daily. (Patient taking differently: Take 10 mg by mouth at bedtime. ) 90 tablet 3  . folic acid (FOLVITE) 1 MG tablet Take 1 mg by mouth daily.  11  . l-methylfolate-B6-B12 (METANX) 3-35-2 MG TABS tablet Take 1 tablet by mouth 2 (two) times daily. 60 tablet 5  . leflunomide (ARAVA) 20 MG tablet Take 20 mg by mouth daily.     . methocarbamol (ROBAXIN) 500 MG tablet Take 1 tablet (500 mg total) by mouth every  6 (six) hours as needed for muscle spasms. 60 tablet 0  . metoprolol succinate (TOPROL-XL) 50 MG 24 hr tablet Take 1 tablet (50 mg total) by mouth daily. Take with or immediately following a meal. 30 tablet 0  . nitroGLYCERIN (NITROSTAT) 0.4 MG SL tablet Place 1 tablet (0.4 mg total) under the tongue every 5 (five) minutes as needed for chest pain. 25 tablet 2  . ondansetron (ZOFRAN) 4 MG tablet Take 4 mg by mouth every 6 (six) hours as needed for nausea.    [name] oxyCODONE (OXY IR/ROXICODONE) 5 MG immediate release tablet Take 1-2 tablets (5-10 mg total) by mouth every 4 (four) hours as needed for moderate pain (pain score 4-6). 30 tablet 0  . pantoprazole (  PROTONIX) 40 MG tablet Take 1 tablet (40 mg total) by mouth daily. 30 tablet 0  . potassium chloride SA (KLOR-CON) 20 MEQ tablet Take 1 tablet (20 mEq total) by mouth 3 (three) times daily. 270 tablet 0  . predniSONE (DELTASONE) 5 MG tablet Take 5 mg by mouth 2 (two) times daily.   1  . pregabalin (LYRICA) 200 MG capsule Take 1 capsule (200 mg total) by mouth 2 (two) times daily. 60 capsule 5  . SAVAYSA 30 MG TABS tablet TAKE 1 TABLET (30 MG TOTAL) BY MOUTH DAILY. 30 tablet 5  . sulfaSALAzine (AZULFIDINE) 500 MG tablet Take 500 mg by mouth in the morning and at bedtime.     . tamsulosin (FLOMAX) 0.4 MG CAPS capsule TAKE 1 CAPSULE (0.4 MG TOTAL) BY MOUTH DAILY AFTER SUPPER. 30 capsule 1  . torsemide (DEMADEX) 10 MG tablet Take 1 tablet (10 mg total) by mouth daily. 90 tablet 0  . valACYclovir (VALTREX) 500 MG tablet TAKE 1 TABLET BY MOUTH EVERY DAY (Patient taking differently: Take 500 mg by mouth daily. ) 30 tablet 4  . Ampicillin Sodium 10 g SOLR     . sulfamethoxazole-trimethoprim (BACTRIM DS) 800-160 MG tablet Take 1 tablet by mouth daily. 30 tablet 0   No facility-administered medications prior to visit.     Review of Systems:   Constitutional:   No  weight loss, night sweats,  Fevers, chills,  +fatigue, or  lassitude.  HEENT:   No  headaches,  Difficulty swallowing,  Tooth/dental problems, or  Sore throat,                No sneezing, itching, ear ache, nasal congestion, post nasal drip,   CV:  No chest pain,  Orthopnea, PND, swelling in lower extremities, anasarca, dizziness, palpitations, syncope.   GI  No heartburn, indigestion, abdominal pain, nausea, vomiting, diarrhea, change in bowel habits, loss of appetite, bloody stools.   Resp: .  No excess mucus, no productive cough,  No non-productive cough,  No coughing up of blood.  No change in color of mucus.  No wheezing.  No chest wall deformity  Skin: no rash or lesions.  GU: no dysuria, change in color of urine, no urgency or frequency.  No flank pain, no hematuria   MS:  + joint pain .    Physical Exam  BP 102/62 (BP Location: Left Arm, Cuff Size: Normal)   Pulse 86   Ht 5\' 5"  (1.651 m)   Wt 166 lb (75.3 kg)   SpO2 98%   BMI 27.62 kg/m   GEN: A/Ox3; pleasant , NAD, power scooter    HEENT:  [place]/AT, nOSE-clear, THROAT-clear, no lesions, no postnasal drip or exudate noted.   NECK:  Supple w/ fair ROM; no JVD; normal carotid impulses w/o bruits; no thyromegaly or nodules palpated; no lymphadenopathy.    RESP  Clear  P & A; w/o, wheezes/ rales/ or rhonchi. no accessory muscle use, no dullness to percussion  CARD:  RRR, no m/r/g, no peripheral edema, pulses intact, no cyanosis or clubbing.  GI:   Soft & nt; nml bowel sounds; no organomegaly or masses detected.   Musco: Warm bil, arthritic changes.   Neuro: alert, no focal deficits noted.    Skin: Warm, no lesions or rashes    Lab Results:  CBC     PFT Results Latest Ref Rng & Units 12/12/2019 01/24/2016 06/29/2015 08/31/2014  FVC-Pre L 2.93 3.46 3.26 3.98  FVC-Predicted Pre % 81  93 87 106  FVC-Post L - 3.52 3.16 4.11  FVC-Predicted Post % - 95 84 109  Pre FEV1/FVC % % 77 78 77 74  Post FEV1/FCV % % - 83 80 78  FEV1-Pre L 2.25 2.68 2.50 2.96  FEV1-Predicted Pre % 86 98 90 106  FEV1-Post L -  2.91 2.51 3.21  DLCO uncorrected ml/min/mmHg 14.36 16.88 14.89 16.15  DLCO UNC% % 65 65 58 63  DLCO corrected ml/min/mmHg 16.75 - 14.69 -  DLCO COR %Predicted % 76 - 57 -  DLVA Predicted % 81 77 81 94  TLC L - 5.70 5.86 5.54  TLC % Predicted % - 94 97 92  RV % Predicted % - 103 106 97    No results found for: NITRICOXIDE      Assessment & Plan:   ILD (interstitial lung disease) (HCC) Interstitial lung disease-with associated rheumatoid arthritis pulmonary function testing shows a slight drop in lung function with increased restriction since 2017.  DLCO remains stable.  Check high-resolution CT chest.  Patient has a component of deconditioning.  Now oxygen dependent nocturnally.  Suspect he has some progressive changes.  Patient has significant immunosuppression with associated rheumatoid arthritis chronic immunosuppression drugs along with HIV and CLL.  Will have patient return with high-resolution CT chest in 2 months with Dr. Chase Caller.  Continue activity as tolerated.  Plan  Patient Instructions  Set up HRCT Chest .  Continue on Oxygen 2/m  Activity as tolerated.  Follow up with Dr. Chase Caller in 2 months and As needed        Chronic respiratory failure with hypoxia ([place]) Newly diagnosed nocturnal hypoxemia on recent overnight oximetry test.  Patient is doing well on 2 L of oxygen at bedtime.  No changes     Rexene Edison, NP 12/22/2019

## 2019-12-26 ENCOUNTER — Telehealth: Payer: Self-pay | Admitting: Internal Medicine

## 2019-12-26 NOTE — Telephone Encounter (Signed)
completed

## 2019-12-26 NOTE — Telephone Encounter (Signed)
[name]   Pls ensure followup end of October 2021 for 30 min o any day for ILD. SEe TP Aug 2021 notes for instructions  Thanks   MR

## 2019-12-27 ENCOUNTER — Telehealth: Payer: Self-pay

## 2019-12-27 ENCOUNTER — Ambulatory Visit (INDEPENDENT_AMBULATORY_CARE_PROVIDER_SITE_OTHER): Payer: Medicare Other | Admitting: Orthopaedic Surgery

## 2019-12-27 ENCOUNTER — Encounter: Payer: Self-pay | Admitting: Orthopaedic Surgery

## 2019-12-27 ENCOUNTER — Ambulatory Visit (INDEPENDENT_AMBULATORY_CARE_PROVIDER_SITE_OTHER): Payer: Medicare Other

## 2019-12-27 DIAGNOSIS — E1142 Type 2 diabetes mellitus with diabetic polyneuropathy: Secondary | ICD-10-CM | POA: Diagnosis not present

## 2019-12-27 DIAGNOSIS — Z452 Encounter for adjustment and management of vascular access device: Secondary | ICD-10-CM | POA: Diagnosis not present

## 2019-12-27 DIAGNOSIS — Z96649 Presence of unspecified artificial hip joint: Secondary | ICD-10-CM

## 2019-12-27 DIAGNOSIS — T84031D Mechanical loosening of internal left hip prosthetic joint, subsequent encounter: Secondary | ICD-10-CM | POA: Diagnosis not present

## 2019-12-27 DIAGNOSIS — D62 Acute posthemorrhagic anemia: Secondary | ICD-10-CM | POA: Diagnosis not present

## 2019-12-27 DIAGNOSIS — T8452XD Infection and inflammatory reaction due to internal left hip prosthesis, subsequent encounter: Secondary | ICD-10-CM | POA: Diagnosis not present

## 2019-12-27 DIAGNOSIS — B952 Enterococcus as the cause of diseases classified elsewhere: Secondary | ICD-10-CM | POA: Diagnosis not present

## 2019-12-27 DIAGNOSIS — T84011D Broken internal left hip prosthesis, subsequent encounter: Secondary | ICD-10-CM

## 2019-12-27 NOTE — Telephone Encounter (Signed)
Thank you :)

## 2019-12-27 NOTE — Progress Notes (Signed)
The patient continue to follow-up for his complicated left hip.  He does report that he is feeling better overall.  I last saw him 5 days ago.  I want to see him today to get new x-rays of his hip.  There is x-rays today still show well-seated implant from his revision surgery.  On examination the skin incision itself is healing over nicely.  There is no redness.  There is minimal drainage at this standpoint and I did not put a needle in the soft tissue today since he is feeling better and the drainage is minimal.  He is now transition to oral antibiotics.  I will see him next week to remove the sutures.  He will continue to increase his activities as comfort allows.  All question concerns were answered and addressed.  No x-rays are needed next week

## 2019-12-27 NOTE — Telephone Encounter (Signed)
New message    PICC line was discontinued last week.  Home health will discontinued the weekly labs if any additional labs are needed please notified the office.

## 2019-12-27 NOTE — Telephone Encounter (Signed)
HH requesting Verbal order to d/c weekly labs. Patient picc line pulled last week. Verbal order given to d/c labs. Routing to MD to make aware. Eugenia Mcalpine

## 2019-12-27 NOTE — Telephone Encounter (Signed)
fyi

## 2019-12-28 NOTE — Telephone Encounter (Signed)
There is a recall that has been placed in regards to getting pt's f/u scheduled. Nothing further needed.

## 2019-12-30 ENCOUNTER — Ambulatory Visit: Admission: RE | Admit: 2019-12-30 | Payer: Medicare Other | Source: Ambulatory Visit

## 2019-12-30 ENCOUNTER — Other Ambulatory Visit: Payer: Self-pay | Admitting: Adult Health

## 2019-12-30 ENCOUNTER — Other Ambulatory Visit: Payer: Self-pay

## 2019-12-30 DIAGNOSIS — J849 Interstitial pulmonary disease, unspecified: Secondary | ICD-10-CM

## 2020-01-01 ENCOUNTER — Telehealth: Payer: Self-pay | Admitting: Internal Medicine

## 2020-01-01 DIAGNOSIS — J9611 Chronic respiratory failure with hypoxia: Secondary | ICD-10-CM

## 2020-01-01 DIAGNOSIS — J849 Interstitial pulmonary disease, unspecified: Secondary | ICD-10-CM

## 2020-01-01 NOTE — Telephone Encounter (Signed)
Hi Emily  Dr Leroy Sea from rehab sent this note on 7/4//21 on this patient that you follow.   "Pt reported became bradycardic with rates ~35 during outpt sleep study. He is in Rehab after hip fracture, do you want me to order consult prior to d/c or f/u as outpt. Tent D/C 7/7 "   Since then Tammy asaw patient and noted doing well on night o2 but I do not know If the night o2 correctd his bradycardia  Plan  - repeat onon on his 2L [place] - ensure followup oct 2021 - see most recent phone note to you  Thanks  MR

## 2020-01-02 ENCOUNTER — Encounter: Payer: Self-pay | Admitting: Urology

## 2020-01-03 ENCOUNTER — Ambulatory Visit: Payer: Medicare Other | Admitting: Gastroenterology

## 2020-01-03 DIAGNOSIS — T84031D Mechanical loosening of internal left hip prosthetic joint, subsequent encounter: Secondary | ICD-10-CM | POA: Diagnosis not present

## 2020-01-03 DIAGNOSIS — E1142 Type 2 diabetes mellitus with diabetic polyneuropathy: Secondary | ICD-10-CM | POA: Diagnosis not present

## 2020-01-03 DIAGNOSIS — Z452 Encounter for adjustment and management of vascular access device: Secondary | ICD-10-CM | POA: Diagnosis not present

## 2020-01-03 DIAGNOSIS — D62 Acute posthemorrhagic anemia: Secondary | ICD-10-CM | POA: Diagnosis not present

## 2020-01-03 DIAGNOSIS — B952 Enterococcus as the cause of diseases classified elsewhere: Secondary | ICD-10-CM | POA: Diagnosis not present

## 2020-01-03 DIAGNOSIS — T8452XD Infection and inflammatory reaction due to internal left hip prosthesis, subsequent encounter: Secondary | ICD-10-CM | POA: Diagnosis not present

## 2020-01-03 LAB — PULMONARY FUNCTION TEST
DL/VA % pred: 81 %
DL/VA: 3.36 ml/min/mmHg/L
DLCO cor % pred: 76 %
DLCO cor: 16.75 ml/min/mmHg
DLCO unc % pred: 65 %
DLCO unc: 14.36 ml/min/mmHg
FEF 25-75 Pre: 1.77 L/sec
FEF2575-%Pred-Pre: 89 %
FEV1-%Pred-Pre: 86 %
FEV1-Pre: 2.25 L
FEV1FVC-%Pred-Pre: 104 %
FEV6-%Pred-Pre: 87 %
FEV6-Pre: 2.93 L
FEV6FVC-%Pred-Pre: 106 %
FVC-%Pred-Pre: 81 %
FVC-Pre: 2.93 L
Pre FEV1/FVC ratio: 77 %
Pre FEV6/FVC Ratio: 100 %

## 2020-01-04 ENCOUNTER — Encounter: Payer: Self-pay | Admitting: Orthopaedic Surgery

## 2020-01-04 ENCOUNTER — Ambulatory Visit (INDEPENDENT_AMBULATORY_CARE_PROVIDER_SITE_OTHER): Payer: Medicare Other | Admitting: Orthopaedic Surgery

## 2020-01-04 DIAGNOSIS — Z96649 Presence of unspecified artificial hip joint: Secondary | ICD-10-CM

## 2020-01-04 DIAGNOSIS — T8452XD Infection and inflammatory reaction due to internal left hip prosthesis, subsequent encounter: Secondary | ICD-10-CM

## 2020-01-04 MED ORDER — OXYCODONE HCL 5 MG PO TABS: 5.0000 mg | ORAL_TABLET | Freq: Four times a day (QID) | ORAL | 0 refills | Status: DC | PRN

## 2020-01-04 NOTE — Progress Notes (Signed)
Patient is now 2 months status post revision of a failed acetabular component of his left hip.  He still reports significant pain.  He is also been treated for an infection and we are looking at chronic suppression likely.  He is here today for wound check.  He reports that the drainage at this point is very minimal.  On examination of his left hip the incision is healing nicely.  There is no redness and no significant seroma.  The 3 sutures are intact and there is no drainage from this area today however we both agreed we would leave the sutures in for at least 1 more week.  He will still change the dressing on a daily because he will likely have some drainage.  When we see him back in 1 week I would like an AP and lateral of his left hip.

## 2020-01-05 NOTE — Telephone Encounter (Signed)
Called and spoke with pt letting him know the info stated by MR and stated to him that we are going to repeat ONO on his 2L O2 and he verbalized understanding. Also scheduled pt a follow up with MR in October after CT scan. Nothing further needed.

## 2020-01-07 ENCOUNTER — Other Ambulatory Visit: Payer: Self-pay | Admitting: Neurology

## 2020-01-09 ENCOUNTER — Other Ambulatory Visit: Payer: Self-pay

## 2020-01-09 MED ORDER — SILODOSIN 8 MG PO CAPS: 8.0000 mg | ORAL_CAPSULE | Freq: Every day | ORAL | 3 refills | Status: DC

## 2020-01-11 DIAGNOSIS — T84031D Mechanical loosening of internal left hip prosthetic joint, subsequent encounter: Secondary | ICD-10-CM | POA: Diagnosis not present

## 2020-01-11 DIAGNOSIS — D62 Acute posthemorrhagic anemia: Secondary | ICD-10-CM | POA: Diagnosis not present

## 2020-01-11 DIAGNOSIS — Z452 Encounter for adjustment and management of vascular access device: Secondary | ICD-10-CM | POA: Diagnosis not present

## 2020-01-11 DIAGNOSIS — B952 Enterococcus as the cause of diseases classified elsewhere: Secondary | ICD-10-CM | POA: Diagnosis not present

## 2020-01-11 DIAGNOSIS — E1142 Type 2 diabetes mellitus with diabetic polyneuropathy: Secondary | ICD-10-CM | POA: Diagnosis not present

## 2020-01-11 DIAGNOSIS — T8452XD Infection and inflammatory reaction due to internal left hip prosthesis, subsequent encounter: Secondary | ICD-10-CM | POA: Diagnosis not present

## 2020-01-12 ENCOUNTER — Other Ambulatory Visit: Payer: Self-pay | Admitting: Urology

## 2020-01-12 ENCOUNTER — Ambulatory Visit (INDEPENDENT_AMBULATORY_CARE_PROVIDER_SITE_OTHER): Payer: Medicare Other | Admitting: Orthopaedic Surgery

## 2020-01-12 ENCOUNTER — Ambulatory Visit (INDEPENDENT_AMBULATORY_CARE_PROVIDER_SITE_OTHER): Payer: Medicare Other | Admitting: Physician Assistant

## 2020-01-12 ENCOUNTER — Ambulatory Visit (INDEPENDENT_AMBULATORY_CARE_PROVIDER_SITE_OTHER): Payer: Medicare Other

## 2020-01-12 ENCOUNTER — Encounter: Payer: Self-pay | Admitting: Physician Assistant

## 2020-01-12 VITALS — BP 100/60 | HR 77 | Ht 67.0 in | Wt 166.2 lb

## 2020-01-12 DIAGNOSIS — I2510 Atherosclerotic heart disease of native coronary artery without angina pectoris: Secondary | ICD-10-CM | POA: Diagnosis not present

## 2020-01-12 DIAGNOSIS — Z96649 Presence of unspecified artificial hip joint: Secondary | ICD-10-CM

## 2020-01-12 DIAGNOSIS — K648 Other hemorrhoids: Secondary | ICD-10-CM | POA: Diagnosis not present

## 2020-01-12 DIAGNOSIS — T8452XD Infection and inflammatory reaction due to internal left hip prosthesis, subsequent encounter: Secondary | ICD-10-CM

## 2020-01-12 DIAGNOSIS — K625 Hemorrhage of anus and rectum: Secondary | ICD-10-CM

## 2020-01-12 MED ORDER — HYDROCORTISONE ACETATE 25 MG RE SUPP: 25.0000 mg | Freq: Two times a day (BID) | RECTAL | 1 refills | Status: AC

## 2020-01-12 NOTE — Patient Instructions (Addendum)
If you are age 71 or older, your body mass index should be between 23-30. Your Body mass index is 26.04 kg/m. If this is out of the aforementioned range listed, please consider follow up with your Primary Care Provider.  If you are age 54 or younger, your body mass index should be between 19-25. Your Body mass index is 26.04 kg/m. If this is out of the aformentioned range listed, please consider follow up with your Primary Care Provider.   We have sent the following medications to your pharmacy for you to pick up at your convenience: Hydrocortisone suppository twice daily for 7 days.   Follow up as needed.

## 2020-01-12 NOTE — Progress Notes (Signed)
The patient comes in today for continued follow-up about 9 to 10 weeks out from a revision arthroplasty of his left hip.  He had multiple surgeries on that hip.  He is also been dealing with an infection.  On exam there is no redness or cellulitis or swelling.  There is still a small punctate draining wound is draining some clear fluid.  We are still treating this with local wound care and I did place a small Aquacel dressing on this that I want him to change daily.  An AP pelvis and lateral of the left hip shows his revision arthroplasty in good position and the hip is well located.  He will continue his antibiotics and local wound care.  I would like to see him back in 3 weeks to see how he is doing overall but no x-rays are needed.  He currently denies any fever or chills and has been putting weight on his hip and leg appropriately with no issues.

## 2020-01-12 NOTE — Progress Notes (Signed)
Chief Complaint: Rectal bleeding  HPI:    [name] is a 69 year old male with a past medical history as listed below, known to Dr. Hilarie Fredrickson, who was referred to me by [name], MD for a complaint of rectal bleeding.      03/23/2018 colonoscopy with 5 polyps throughout the colon, severe diverticulosis and internal hemorrhoids.  Pathology showed a mix of tubular adenomas and hyperplastic polyps.  Repeat recommended in 3 years.    Today, the patient presents to clinic and tells me that last week he had a normal soft solid bowel movement which was accompanied by a lot of blood on the toilet paper, bright red, after wiping he stood up and the blood seemed to seep out and run onto the floor into a puddle.  He then had another bowel movement that day with just some smears of bright red blood in the toilet paper and since then he is continued with occasional smearing of bright red blood on the toilet paper with bowel movements.  Denies any rectal pain.  No change in his normal soft solid stools every day.  He is on a high-fiber diet.    Denies fever, chills, abdominal pain, weight loss or change in bowel habits.  Past Medical History:  Diagnosis Date  . Allergy   . Anxiety   . Carotid artery occlusion    40-60% right ICA stenosis (09/2008)  . Cataract   . Chronic back pain   . CLL (chronic lymphoblastic leukemia) dx 2010   Followed at [facility] q[facility], no current therapy   . Clotting disorder ([place])   . Coronary artery disease 2010   s/p CABG '10, sees Dr. Percival Spanish  . Depression   . Diverticulosis   . DVT, lower extremity, recurrent ([place]) 2008, 2009   LLE, chronic anticoag since 2009  . Esophagitis   . Fibromyalgia   . Gallstones   . GERD (gastroesophageal reflux disease)   . Gout   . Gynecomastia, male   . H/O hiatal hernia 2008   surgery  . Hemorrhoids   . Hepatitis A yrs ago  . HIV infection ([place]) dx 1993  . Hypertension   . Impotence of organic origin   . Myocardial infarction  ([place]) 2010    x 2  . Neuromuscular disorder ([facility])    neuropathy  . Osteoarthritis, knee    s/p B TKA  . Osteoporosis   . PONV (postoperative nausea and vomiting)   . Pulmonary fibrosis ([place]) 2017  . Rheumatoid arthritis(714.0) dx 2010   MTX, follows with rheum  . Seasonal allergies   . Secondary syphilis 07/24/14 dx   s/p 2 wks doxy  . Status post dilation of esophageal narrowing   . Stroke [facility]) 1969   TIA  . TIA (transient ischemic attack) 1997   mild residual L mouth droop  . Tubular adenoma of colon     Past Surgical History:  Procedure Laterality Date  . ACETABULAR REVISION Left 11/03/2019   REVISION LEFT HIP ACETABULAR COMPONENT (Left Hip)  . ANTERIOR HIP REVISION Left 08/17/2018   Procedure: ANTERIOR LEFT HIP REVISION;  Surgeon: Mcarthur Rossetti, MD;  Location: [place];  Service: Orthopedics;  Laterality: Left;  . ANTERIOR HIP REVISION Left 11/02/2018   Procedure: LEFT HIP CONSTRAINED LINER REVISION;  Surgeon: Mcarthur Rossetti, MD;  Location: [place];  Service: Orthopedics;  Laterality: Left;  . ANTERIOR HIP REVISION Left 11/03/2019   Procedure: REVISION LEFT HIP ACETABULAR COMPONENT;  Surgeon: Ninfa Linden,  Lind Guest, MD;  Location: [place];  Service: Orthopedics;  Laterality: Left;  . BACK SURGERY  2010 & 2017  . CHOLECYSTECTOMY    . COLONOSCOPY WITH PROPOFOL N/A 12/28/2012   Procedure: COLONOSCOPY WITH PROPOFOL;  Surgeon: Jerene Bears, MD;  Location: [facility];  Service: Gastroenterology;  Laterality: N/A;  . CORONARY ARTERY BYPASS GRAFT  2010   triple bypass  . ESOPHAGOGASTRODUODENOSCOPY (EGD) WITH PROPOFOL N/A 12/28/2012   Procedure: ESOPHAGOGASTRODUODENOSCOPY (EGD) WITH PROPOFOL;  Surgeon: Jerene Bears, MD;  [facility];  Service: Gastroenterology;  Laterality: N/A;  . ESOPHAGOGASTRODUODENOSCOPY (EGD) WITH PROPOFOL N/A 03/15/2013   Procedure: ESOPHAGOGASTRODUODENOSCOPY (EGD) WITH PROPOFOL;  Surgeon: Jerene Bears, MD;  [facility];   Service: Gastroenterology;  Laterality: N/A;  . ESOPHAGOGASTRODUODENOSCOPY (EGD) WITH PROPOFOL N/A 02/07/2016   Procedure: ESOPHAGOGASTRODUODENOSCOPY (EGD) WITH PROPOFOL;  Surgeon: Jerene Bears, MD;  [facility];  Service: Gastroenterology;  Laterality: N/A;  . HARDWARE REMOVAL N/A 07/02/2012   Procedure: HARDWARE REMOVAL;  Surgeon: Elaina Hoops, MD;  Location: [place] [facility];  Service: Neurosurgery;  Laterality: N/A;  . HIATAL HERNIA REPAIR     wrap  . HIP CLOSED REDUCTION Left 08/15/2018   Procedure: CLOSED REDUCTION HIP;  Surgeon: Newt Minion, MD;  Location: [place];  Service: Orthopedics;  Laterality: Left;  . HIP CLOSED REDUCTION Left 08/28/2018   Procedure: CLOSED REDUCTION HIP FOR RECURRENT DISLOCATION AND DRESSING CHANGE;  Surgeon: Jessy Oto, MD;  Location: [place];  Service: Orthopedics;  Laterality: Left;  . HIP CLOSED REDUCTION Left 09/15/2018   Procedure: CLOSED REDUCTION HIP DISLOCATION;  Surgeon: Mcarthur Rossetti, MD;  Location: [place];  Service: Orthopedics;  Laterality: Left;  . HIP CLOSED REDUCTION Left 10/31/2018   Procedure: CLOSED REDUCTION LEFT TOTAL HIP;  Surgeon: Leandrew Koyanagi, MD;  Location: [place];  Service: Orthopedics;  Laterality: Left;  . INGUINAL HERNIA REPAIR Bilateral   . JOINT REPLACEMENT Left 1999  . KNEE ARTHROPLASTY  07/22/2011   Procedure: COMPUTER ASSISTED TOTAL KNEE ARTHROPLASTY;  Surgeon: Meredith Pel, MD;  Location: [place];  Service: Orthopedics;  Laterality: Right;  Right total knee arthroplasty  . MANDIBLE SURGERY Bilateral    tmj  . REPLACEMENT TOTAL KNEE Bilateral   . ring around testicle hernia reapir  184 and 1986   x 2  . ROTATOR CUFF REPAIR Right   . SHOULDER SURGERY Left   . SPINE SURGERY  2010   "rod and screws", "failed", lopwer spine,   . stent to heart x 1  2010  . TONSILLECTOMY    . TOTAL HIP ARTHROPLASTY Left 08/13/2018   Procedure: LEFT TOTAL HIP ARTHROPLASTY ANTERIOR APPROACH;  Surgeon: Mcarthur Rossetti, MD;   Location: [place];  Service: Orthopedics;  Laterality: Left;  . TOTAL HIP ARTHROPLASTY Left 09/17/2018   Procedure: ANTERIOR LEFT HIP REVISION;  Surgeon: Mcarthur Rossetti, MD;  Location: [place];  Service: Orthopedics;  Laterality: Left;  . UMBILICAL HERNIA REPAIR     x 1  . varicose vein     stripping  . VIDEO BRONCHOSCOPY Bilateral 10/16/2015   Procedure: VIDEO BRONCHOSCOPY WITHOUT FLUORO;  Surgeon: Brand Males, MD;  [facility];  Service: Cardiopulmonary;  Laterality: Bilateral;    Current Outpatient Medications  Medication Sig Dispense Refill  . acetaminophen (TYLENOL) 500 MG tablet Take 500-1,000 mg by mouth every 6 (six) hours as needed for mild pain or moderate pain.     [name] alfuzosin (UROXATRAL) 10 MG  24 hr tablet Take 1 tablet (10 mg total) by mouth daily with breakfast. 30 tablet 11  . amoxicillin (AMOXIL) 875 MG tablet Take 1 tablet (875 mg total) by mouth 2 (two) times daily. 60 tablet 0  . atorvastatin (LIPITOR) 10 MG tablet Take 1 tablet (10 mg total) by mouth at bedtime. 30 tablet 9  . bictegravir-emtricitabine-tenofovir AF (BIKTARVY) 50-200-25 MG TABS tablet Take 1 tablet by mouth daily.     . clotrimazole-betamethasone (LOTRISONE) cream Apply 1 application topically 2 (two) times daily. 30 g 0  . darunavir-cobicistat (PREZCOBIX) 800-150 MG tablet Take 1 tablet by mouth daily with breakfast.     . docusate sodium (COLACE) 100 MG capsule Take 1 capsule (100 mg total) by mouth 2 (two) times daily. 10 capsule 0  . EPINEPHrine (EPIPEN 2-PAK) 0.3 mg/0.3 mL IJ SOAJ injection Inject 0.3 mg into the muscle as needed for anaphylaxis.    [name] escitalopram (LEXAPRO) 20 MG tablet Take 1 tablet (20 mg total) by mouth daily. 90 tablet 1  . ezetimibe (ZETIA) 10 MG tablet Take 1 tablet (10 mg total) by mouth daily. (Patient taking differently: Take 10 mg by mouth at bedtime. ) 90 tablet 3  . folic acid (FOLVITE) 1 MG tablet Take 1 mg by mouth daily.  11  . l-methylfolate-B6-B12  (METANX) 3-35-2 MG TABS tablet TAKE 1 TABLET BY MOUTH 2 (TWO) TIMES DAILY. 180 tablet 1  . leflunomide (ARAVA) 20 MG tablet Take 20 mg by mouth daily.     . metoprolol succinate (TOPROL-XL) 50 MG 24 hr tablet Take 1 tablet (50 mg total) by mouth daily. Take with or immediately following a meal. 30 tablet 0  . nitroGLYCERIN (NITROSTAT) 0.4 MG SL tablet Place 1 tablet (0.4 mg total) under the tongue every 5 (five) minutes as needed for chest pain. 25 tablet 2  . ondansetron (ZOFRAN) 4 MG tablet Take 4 mg by mouth every 6 (six) hours as needed for nausea.    [name] oxyCODONE (OXY IR/ROXICODONE) 5 MG immediate release tablet Take 1-2 tablets (5-10 mg total) by mouth every 6 (six) hours as needed for moderate pain (pain score 4-6). 30 tablet 0  . pantoprazole (PROTONIX) 40 MG tablet Take 1 tablet (40 mg total) by mouth daily. 30 tablet 0  . potassium chloride SA (KLOR-CON) 20 MEQ tablet Take 1 tablet (20 mEq total) by mouth 3 (three) times daily. 270 tablet 0  . predniSONE (DELTASONE) 5 MG tablet Take 5 mg by mouth 2 (two) times daily.   1  . pregabalin (LYRICA) 200 MG capsule Take 1 capsule (200 mg total) by mouth 2 (two) times daily. 60 capsule 5  . SAVAYSA 30 MG TABS tablet TAKE 1 TABLET (30 MG TOTAL) BY MOUTH DAILY. 30 tablet 5  . silodosin (RAPAFLO) 8 MG CAPS capsule Take 1 capsule (8 mg total) by mouth daily with breakfast. 30 capsule 3  . sulfaSALAzine (AZULFIDINE) 500 MG tablet Take 500 mg by mouth in the morning and at bedtime.     . torsemide (DEMADEX) 20 MG tablet Take 20 mg by mouth daily.    . valACYclovir (VALTREX) 500 MG tablet TAKE 1 TABLET BY MOUTH EVERY DAY (Patient taking differently: Take 500 mg by mouth daily. ) 30 tablet 4   No current facility-administered medications for this visit.    Allergies as of 01/12/2020 - Review Complete 01/12/2020  Allergen Reaction Noted  . Golimumab Anaphylaxis 01/21/2018  . Orencia [abatacept] Anaphylaxis 02/15/2018  . Other Anaphylaxis and  Hives  07/08/2011  . Peanut-containing drug products Anaphylaxis, Hives, and Swelling 06/23/2012  . Morphine Other (See Comments) 08/13/2016  . Oxycodone-acetaminophen Other (See Comments)   . Promethazine hcl Other (See Comments)     Family History  Problem Relation Age of Onset  . Breast cancer Mother   . Hypertension Mother   . Hyperlipidemia Mother   . Diabetes Mother   . Prostate cancer Father   . Colon polyps Father   . Hyperlipidemia Father   . Crohn's disease Paternal Aunt   . Diabetes Maternal Grandmother   . Diabetes Brother        x 3  . Heart attack Brother   . Heart disease Brother        x 3  . Heart attack Brother   . Hyperlipidemia Brother        x 3  . Alcohol abuse Daughter   . Drug abuse Daughter   . Asthma Brother   . CVA Brother   . Colon cancer Neg Hx   . Esophageal cancer Neg Hx   . Rectal cancer Neg Hx   . Stomach cancer Neg Hx     Social History   Socioeconomic History  . Marital status: Widowed    Spouse name: Not on file  . Number of children: 3  . Years of education: Not on file  . Highest education level: Bachelor's degree (e.g., BA, AB, BS)  Occupational History  . Occupation: retired  Tobacco Use  . Smoking status: Never Smoker  . Smokeless tobacco: Never Used  . Tobacco comment: occ wine  Vaping Use  . Vaping Use: Never used  Substance and Sexual Activity  . Alcohol use: Not Currently    Comment: occasional wine- 1-2 per week  . Drug use: Not Currently    Types: Marijuana    Comment: 1 month ago  . Sexual activity: Not Currently    Comment: declined condoms  Other Topics Concern  . Not on file  Social History Narrative   Patient is right-handed. He is a widower. He lives alone in a single level home. He is active around his home. He drinks 2-3 cups of coffee a day.   Social Determinants of Health   Financial Resource Strain:   . Difficulty of Paying Living Expenses: Not on file  Food Insecurity:   . Worried About Paediatric nurse in the Last Year: Not on file  . Ran Out of Food in the Last Year: Not on file  Transportation Needs:   . Lack of Transportation (Medical): Not on file  . Lack of Transportation (Non-Medical): Not on file  Physical Activity:   . Days of Exercise per Week: Not on file  . Minutes of Exercise per Session: Not on file  Stress:   . Feeling of Stress : Not on file  Social Connections:   . Frequency of Communication with Friends and Family: Not on file  . Frequency of Social Gatherings with Friends and Family: Not on file  . Attends Religious Services: Not on file  . Active Member of Clubs or Organizations: Not on file  . Attends Archivist Meetings: Not on file  . Marital Status: Not on file  Intimate Partner Violence:   . Fear of Current or Ex-Partner: Not on file  . Emotionally Abused: Not on file  . Physically Abused: Not on file  . Sexually Abused: Not on file    Review of Systems:  Constitutional: No weight loss, fever or chills Cardiovascular: No chest pain   Respiratory: No SOB  Gastrointestinal: See HPI and otherwise negative   Physical Exam:  Vital signs: BP 100/60   Pulse 77   Ht 5\' 7"  (1.702 m)   Wt 166 lb 4 oz (75.4 kg)   BMI 26.04 kg/m   Constitutional:   Pleasant Caucasian male appears to be in NAD, Well developed, Well nourished, alert and cooperative Respiratory: Respirations even and unlabored. Lungs clear to auscultation bilaterally.   No wheezes, crackles, or rhonchi.  Cardiovascular: Normal S1, S2. No MRG. Regular rate and rhythm. No peripheral edema, cyanosis or pallor.  Gastrointestinal:  Soft, nondistended, nontender. No rebound or guarding. Normal bowel sounds. No appreciable masses or hepatomegaly. Rectal:  External: loose sphincter tone; Internal: no mass, no residue; Anoscopy: Grade II hemorrhoids, excoriation to mucosa (healing) Psychiatric: Demonstrates good judgement and reason without abnormal affect or  behaviors.  RELEVANT LABS AND IMAGING: CBC    Component Value Date/Time   WBC 11.1 (H) 12/08/2019 1429   RBC 2.92 (L) 12/08/2019 1429   HGB 10.4 (L) 12/08/2019 1429   HGB 12.3 (L) 08/16/2019 0946   HGB 12.9 (L) 06/06/2019 1156   HGB 12.4 (L) 02/12/2017 0947   HCT 31.9 (L) 12/08/2019 1429   HCT 39.3 06/06/2019 1156   HCT 39.0 02/12/2017 0947   PLT 107 (L) 12/08/2019 1429   PLT 123 (L) 08/16/2019 0946   PLT 126 (L) 06/06/2019 1156   MCV 109.2 (H) 12/08/2019 1429   MCV 104 (H) 06/06/2019 1156   MCV 85.4 02/12/2017 0947   MCH 35.6 (H) 12/08/2019 1429   MCHC 32.6 12/08/2019 1429   RDW 14.6 12/08/2019 1429   RDW 14.5 06/06/2019 1156   RDW 21.2 (H) 02/12/2017 0947   LYMPHSABS 6,882 (H) 12/08/2019 1429   LYMPHSABS 6.1 (H) 02/12/2017 0947   MONOABS 1.4 (H) 11/11/2019 0533   MONOABS 0.7 02/12/2017 0947   EOSABS 33 12/08/2019 1429   EOSABS 0.5 02/12/2017 0947   BASOSABS 33 12/08/2019 1429   BASOSABS 0.1 02/12/2017 0947    CMP     Component Value Date/Time   NA 141 12/08/2019 1429   NA 142 06/06/2019 1156   NA 140 08/13/2016 0919   K 3.3 (L) 12/08/2019 1429   K 3.7 08/13/2016 0919   CL 102 12/08/2019 1429   CO2 30 12/08/2019 1429   CO2 27 08/13/2016 0919   GLUCOSE 118 (H) 12/08/2019 1429   GLUCOSE 86 08/13/2016 0919   BUN 15 12/08/2019 1429   BUN 17 06/06/2019 1156   BUN 18.7 08/13/2016 0919   CREATININE 0.89 12/08/2019 1429   CREATININE 1.1 08/13/2016 0919   CALCIUM 8.1 (L) 12/08/2019 1429   CALCIUM 8.8 08/13/2016 0919   PROT 5.4 (L) 12/08/2019 1429   PROT 5.9 (L) 08/13/2016 0919   ALBUMIN 2.1 (L) 11/11/2019 0533   ALBUMIN 3.9 08/13/2016 0919   AST 24 12/08/2019 1429   AST 16 08/16/2019 0946   AST 21 08/13/2016 0919   ALT 14 12/08/2019 1429   ALT 12 08/16/2019 0946   ALT 18 08/13/2016 0919   ALKPHOS 57 11/11/2019 0533   ALKPHOS 114 08/13/2016 0919   BILITOT 0.4 12/08/2019 1429   BILITOT 0.7 08/16/2019 0946   BILITOT 0.59 08/13/2016 0919   GFRNONAA 86  12/08/2019 1429   GFRAA 100 12/08/2019 1429    Assessment: 1.  Rectal bleeding: Two bowel movements with quite a bit of blood last week, now just  smears on the toilet paper, hemorrhoids seen at time of anoscopy today which are likely the source, last colonoscopy in 2019 with recommendations to repeat in 5 years 2.  Grade 2 internal hemorrhoids: Likely source for above  Plan: 1.  Prescribed Hydrocortisone suppositories to be applied twice daily for 7 days with one refill. 2.  Patient to follow in clinic with [place] if this does not resolve his bleeding.  Ellouise Newer, PA-C [facility] 01/12/2020, 1:42 PM  Cc: [name], MD

## 2020-01-13 NOTE — Progress Notes (Signed)
Addendum: Reviewed and agree with assessment and management plan. Denia Mcvicar M, MD  

## 2020-01-16 DIAGNOSIS — E785 Hyperlipidemia, unspecified: Secondary | ICD-10-CM | POA: Diagnosis not present

## 2020-01-16 DIAGNOSIS — E1142 Type 2 diabetes mellitus with diabetic polyneuropathy: Secondary | ICD-10-CM | POA: Diagnosis not present

## 2020-01-16 DIAGNOSIS — Z8673 Personal history of transient ischemic attack (TIA), and cerebral infarction without residual deficits: Secondary | ICD-10-CM | POA: Diagnosis not present

## 2020-01-16 DIAGNOSIS — T84031D Mechanical loosening of internal left hip prosthetic joint, subsequent encounter: Secondary | ICD-10-CM | POA: Diagnosis not present

## 2020-01-16 DIAGNOSIS — J849 Interstitial pulmonary disease, unspecified: Secondary | ICD-10-CM | POA: Diagnosis not present

## 2020-01-16 DIAGNOSIS — M069 Rheumatoid arthritis, unspecified: Secondary | ICD-10-CM | POA: Diagnosis not present

## 2020-01-16 DIAGNOSIS — Z96653 Presence of artificial knee joint, bilateral: Secondary | ICD-10-CM | POA: Diagnosis not present

## 2020-01-16 DIAGNOSIS — K5790 Diverticulosis of intestine, part unspecified, without perforation or abscess without bleeding: Secondary | ICD-10-CM | POA: Diagnosis not present

## 2020-01-16 DIAGNOSIS — Z951 Presence of aortocoronary bypass graft: Secondary | ICD-10-CM | POA: Diagnosis not present

## 2020-01-16 DIAGNOSIS — I10 Essential (primary) hypertension: Secondary | ICD-10-CM | POA: Diagnosis not present

## 2020-01-16 DIAGNOSIS — T8452XD Infection and inflammatory reaction due to internal left hip prosthesis, subsequent encounter: Secondary | ICD-10-CM | POA: Diagnosis not present

## 2020-01-16 DIAGNOSIS — D62 Acute posthemorrhagic anemia: Secondary | ICD-10-CM | POA: Diagnosis not present

## 2020-01-16 DIAGNOSIS — B20 Human immunodeficiency virus [HIV] disease: Secondary | ICD-10-CM | POA: Diagnosis not present

## 2020-01-16 DIAGNOSIS — G8929 Other chronic pain: Secondary | ICD-10-CM | POA: Diagnosis not present

## 2020-01-16 DIAGNOSIS — I2510 Atherosclerotic heart disease of native coronary artery without angina pectoris: Secondary | ICD-10-CM | POA: Diagnosis not present

## 2020-01-16 DIAGNOSIS — M797 Fibromyalgia: Secondary | ICD-10-CM | POA: Diagnosis not present

## 2020-01-16 DIAGNOSIS — B952 Enterococcus as the cause of diseases classified elsewhere: Secondary | ICD-10-CM | POA: Diagnosis not present

## 2020-01-16 DIAGNOSIS — F329 Major depressive disorder, single episode, unspecified: Secondary | ICD-10-CM | POA: Diagnosis not present

## 2020-01-16 DIAGNOSIS — E1136 Type 2 diabetes mellitus with diabetic cataract: Secondary | ICD-10-CM | POA: Diagnosis not present

## 2020-01-16 DIAGNOSIS — D689 Coagulation defect, unspecified: Secondary | ICD-10-CM | POA: Diagnosis not present

## 2020-01-16 DIAGNOSIS — C9110 Chronic lymphocytic leukemia of B-cell type not having achieved remission: Secondary | ICD-10-CM | POA: Diagnosis not present

## 2020-01-16 DIAGNOSIS — Z86718 Personal history of other venous thrombosis and embolism: Secondary | ICD-10-CM | POA: Diagnosis not present

## 2020-01-16 DIAGNOSIS — F419 Anxiety disorder, unspecified: Secondary | ICD-10-CM | POA: Diagnosis not present

## 2020-01-17 ENCOUNTER — Other Ambulatory Visit: Payer: Self-pay | Admitting: Infectious Diseases

## 2020-01-17 DIAGNOSIS — M87052 Idiopathic aseptic necrosis of left femur: Secondary | ICD-10-CM

## 2020-01-18 DIAGNOSIS — E1142 Type 2 diabetes mellitus with diabetic polyneuropathy: Secondary | ICD-10-CM | POA: Diagnosis not present

## 2020-01-18 DIAGNOSIS — I10 Essential (primary) hypertension: Secondary | ICD-10-CM | POA: Diagnosis not present

## 2020-01-18 DIAGNOSIS — T84031D Mechanical loosening of internal left hip prosthetic joint, subsequent encounter: Secondary | ICD-10-CM | POA: Diagnosis not present

## 2020-01-18 DIAGNOSIS — T8452XD Infection and inflammatory reaction due to internal left hip prosthesis, subsequent encounter: Secondary | ICD-10-CM | POA: Diagnosis not present

## 2020-01-18 DIAGNOSIS — I2510 Atherosclerotic heart disease of native coronary artery without angina pectoris: Secondary | ICD-10-CM | POA: Diagnosis not present

## 2020-01-18 DIAGNOSIS — B952 Enterococcus as the cause of diseases classified elsewhere: Secondary | ICD-10-CM | POA: Diagnosis not present

## 2020-01-21 DIAGNOSIS — M87052 Idiopathic aseptic necrosis of left femur: Secondary | ICD-10-CM

## 2020-01-23 NOTE — Telephone Encounter (Signed)
I have sent a community message to [name]/[name]/[name]/[name] at [facility] to check status of order.  ONO order was sent on 8/27.

## 2020-01-23 NOTE — Telephone Encounter (Signed)
PCC's can you please check status of the ONO on 2lpm being scheduled? Pt is sending email asking about this, thanks

## 2020-01-24 MED ORDER — AMOXICILLIN 875 MG PO TABS: 875.0000 mg | ORAL_TABLET | Freq: Two times a day (BID) | ORAL | 2 refills | Status: AC

## 2020-01-24 NOTE — Telephone Encounter (Signed)
Message received from [place] at [facility] -        I have reached out to the manager for help with this one. Normally this patient would be out of our service area, however I know we have made special arrangements for him.   Either myself or [name] will follow up and be in touch.

## 2020-01-24 NOTE — Addendum Note (Signed)
Addended by: Eugenia Mcalpine on: 01/24/2020 09:26 AM   Modules accepted: Orders

## 2020-01-25 DIAGNOSIS — T84031D Mechanical loosening of internal left hip prosthetic joint, subsequent encounter: Secondary | ICD-10-CM | POA: Diagnosis not present

## 2020-01-25 DIAGNOSIS — I10 Essential (primary) hypertension: Secondary | ICD-10-CM | POA: Diagnosis not present

## 2020-01-25 DIAGNOSIS — I2510 Atherosclerotic heart disease of native coronary artery without angina pectoris: Secondary | ICD-10-CM | POA: Diagnosis not present

## 2020-01-25 DIAGNOSIS — E1142 Type 2 diabetes mellitus with diabetic polyneuropathy: Secondary | ICD-10-CM | POA: Diagnosis not present

## 2020-01-25 DIAGNOSIS — B952 Enterococcus as the cause of diseases classified elsewhere: Secondary | ICD-10-CM | POA: Diagnosis not present

## 2020-01-25 DIAGNOSIS — T8452XD Infection and inflammatory reaction due to internal left hip prosthesis, subsequent encounter: Secondary | ICD-10-CM | POA: Diagnosis not present

## 2020-01-27 NOTE — Telephone Encounter (Signed)
Sent message to [name]/[name]/[name]/Pam at [facility] to request an update.

## 2020-01-27 NOTE — Telephone Encounter (Signed)
Message received from [facility] -      I have reached out to my manager asking for an update.

## 2020-01-30 ENCOUNTER — Ambulatory Visit (HOSPITAL_COMMUNITY): Payer: Medicare Other | Attending: Internal Medicine

## 2020-01-30 ENCOUNTER — Other Ambulatory Visit: Payer: Self-pay

## 2020-01-30 ENCOUNTER — Encounter (HOSPITAL_COMMUNITY): Payer: Self-pay

## 2020-01-30 DIAGNOSIS — R278 Other lack of coordination: Secondary | ICD-10-CM | POA: Insufficient documentation

## 2020-01-30 DIAGNOSIS — R29898 Other symptoms and signs involving the musculoskeletal system: Secondary | ICD-10-CM

## 2020-01-30 NOTE — Therapy (Addendum)
[place], [place], [address] Phone: [phone]   Fax:  [phone]  Occupational Therapy Wheelchair Evaluation  Patient Details  Name: [name] MRN: [id number] Date of Birth: 10/22/1950 Referring Provider (OT): [name], MD   Encounter Date: 01/30/2020   OT End of Session - 01/30/20 1402    Visit Number 1    Number of Visits 1    Authorization Type 1) Medicare 2) BCBS (BCBS picks up with Medicare does not.)    Progress Note Due on Visit 10    OT Start Time 1300    OT Stop Time 1400    OT Time Calculation (min) 60 min    Activity Tolerance Patient tolerated treatment well    Behavior During Therapy [facility] for tasks assessed/performed           Past Medical History:  Diagnosis Date  . Allergy   . Anxiety   . Carotid artery occlusion    40-60% right ICA stenosis (09/2008)  . Cataract   . Chronic back pain   . CLL (chronic lymphoblastic leukemia) dx 2010   Followed at [facility] q84mo, no current therapy   . Clotting disorder ([place])   . Coronary artery disease 2010   s/p CABG '10, sees Dr. Percival Spanish  . Depression   . Diverticulosis   . DVT, lower extremity, recurrent ([place]) 2008, 2009   LLE, chronic anticoag since 2009  . Esophagitis   . Fibromyalgia   . Gallstones   . GERD (gastroesophageal reflux disease)   . Gout   . Gynecomastia, male   . H/O hiatal hernia 2008   surgery  . Hemorrhoids   . Hepatitis A yrs ago  . HIV infection ([place]) dx 1993  . Hypertension   . Impotence of organic origin   . Myocardial infarction ([place]) 2010    x 2  . Neuromuscular disorder (H[facility])    neuropathy  . Osteoarthritis, knee    s/p B TKA  . Osteoporosis   . PONV (postoperative nausea and vomiting)   . Pulmonary fibrosis ([place]) 2017  . Rheumatoid arthritis(714.0) dx 2010   MTX, follows with rheum  . Seasonal allergies   . Secondary syphilis 07/24/14 dx   s/p 2 wks doxy  . Status post dilation of esophageal  narrowing   . Stroke [facility]) 1969   TIA  . TIA (transient ischemic attack) 1997   mild residual L mouth droop  . Tubular adenoma of colon     Past Surgical History:  Procedure Laterality Date  . ACETABULAR REVISION Left 11/03/2019   REVISION LEFT HIP ACETABULAR COMPONENT (Left Hip)  . ANTERIOR HIP REVISION Left 08/17/2018   Procedure: ANTERIOR LEFT HIP REVISION;  Surgeon: Mcarthur Rossetti, MD;  Location: [place];  Service: Orthopedics;  Laterality: Left;  . ANTERIOR HIP REVISION Left 11/02/2018   Procedure: LEFT HIP CONSTRAINED LINER REVISION;  Surgeon: Mcarthur Rossetti, MD;  Location: [place];  Service: Orthopedics;  Laterality: Left;  . ANTERIOR HIP REVISION Left 11/03/2019   Procedure: REVISION LEFT HIP ACETABULAR COMPONENT;  Surgeon: Mcarthur Rossetti, MD;  Location: [place];  Service: Orthopedics;  Laterality: Left;  . BACK SURGERY  2010 & 2017  . CHOLECYSTECTOMY    . COLONOSCOPY WITH PROPOFOL N/A 12/28/2012   Procedure: COLONOSCOPY WITH PROPOFOL;  Surgeon: Jerene Bears, MD;  [facility];  Service: Gastroenterology;  Laterality: N/A;  . CORONARY ARTERY BYPASS GRAFT  2010   triple  bypass  . ESOPHAGOGASTRODUODENOSCOPY (EGD) WITH PROPOFOL N/A 12/28/2012   Procedure: ESOPHAGOGASTRODUODENOSCOPY (EGD) WITH PROPOFOL;  Surgeon: Jerene Bears, MD;  [facility];  Service: Gastroenterology;  Laterality: N/A;  . ESOPHAGOGASTRODUODENOSCOPY (EGD) WITH PROPOFOL N/A 03/15/2013   Procedure: ESOPHAGOGASTRODUODENOSCOPY (EGD) WITH PROPOFOL;  Surgeon: Jerene Bears, MD;  [facility];  Service: Gastroenterology;  Laterality: N/A;  . ESOPHAGOGASTRODUODENOSCOPY (EGD) WITH PROPOFOL N/A 02/07/2016   Procedure: ESOPHAGOGASTRODUODENOSCOPY (EGD) WITH PROPOFOL;  Surgeon: Jerene Bears, MD;  [facility];  Service: Gastroenterology;  Laterality: N/A;  . HARDWARE REMOVAL N/A 07/02/2012   Procedure: HARDWARE REMOVAL;  Surgeon: Elaina Hoops, MD;  Location: [place] [facility];   Service: Neurosurgery;  Laterality: N/A;  . HIATAL HERNIA REPAIR     wrap  . HIP CLOSED REDUCTION Left 08/15/2018   Procedure: CLOSED REDUCTION HIP;  Surgeon: Newt Minion, MD;  Location: [place];  Service: Orthopedics;  Laterality: Left;  . HIP CLOSED REDUCTION Left 08/28/2018   Procedure: CLOSED REDUCTION HIP FOR RECURRENT DISLOCATION AND DRESSING CHANGE;  Surgeon: Jessy Oto, MD;  Location: [place];  Service: Orthopedics;  Laterality: Left;  . HIP CLOSED REDUCTION Left 09/15/2018   Procedure: CLOSED REDUCTION HIP DISLOCATION;  Surgeon: Mcarthur Rossetti, MD;  Location: [place];  Service: Orthopedics;  Laterality: Left;  . HIP CLOSED REDUCTION Left 10/31/2018   Procedure: CLOSED REDUCTION LEFT TOTAL HIP;  Surgeon: Leandrew Koyanagi, MD;  Location: [place];  Service: Orthopedics;  Laterality: Left;  . INGUINAL HERNIA REPAIR Bilateral   . JOINT REPLACEMENT Left 1999  . KNEE ARTHROPLASTY  07/22/2011   Procedure: COMPUTER ASSISTED TOTAL KNEE ARTHROPLASTY;  Surgeon: Meredith Pel, MD;  Location: [place];  Service: Orthopedics;  Laterality: Right;  Right total knee arthroplasty  . MANDIBLE SURGERY Bilateral    tmj  . REPLACEMENT TOTAL KNEE Bilateral   . ring around testicle hernia reapir  184 and 1986   x 2  . ROTATOR CUFF REPAIR Right   . SHOULDER SURGERY Left   . SPINE SURGERY  2010   "rod and screws", "failed", lopwer spine,   . stent to heart x 1  2010  . TONSILLECTOMY    . TOTAL HIP ARTHROPLASTY Left 08/13/2018   Procedure: LEFT TOTAL HIP ARTHROPLASTY ANTERIOR APPROACH;  Surgeon: Mcarthur Rossetti, MD;  Location: [place];  Service: Orthopedics;  Laterality: Left;  . TOTAL HIP ARTHROPLASTY Left 09/17/2018   Procedure: ANTERIOR LEFT HIP REVISION;  Surgeon: Mcarthur Rossetti, MD;  Location: [place];  Service: Orthopedics;  Laterality: Left;  . UMBILICAL HERNIA REPAIR     x 1  . varicose vein     stripping  . VIDEO BRONCHOSCOPY Bilateral 10/16/2015   Procedure: VIDEO BRONCHOSCOPY  WITHOUT FLUORO;  Surgeon: Brand Males, MD;  [facility];  Service: Cardiopulmonary;  Laterality: Bilateral;    There were no vitals filed for this visit.   Subjective Assessment - 01/30/20 1401    Subjective  S: I've known this would happen eventually and I've been trying to prepare.             [facility] OT Assessment - 01/30/20 1401      Assessment   Medical Diagnosis Wheelchair evaluation    Referring Provider (OT) [name], MD    Onset Date/Surgical Date --   years of decreased mobility noted due      Precautions   Precautions Fall      Prior Function   Level  of Independence Requires assistive device for independence                  Date: 01/30/2020 Patient Name: [name] Address: [address] [place] [place] [address] DOB: [birth year]                    Najeh Credit was seen today in this clinic for a powered wheelchair evaluation. Mr. Dittus has a past medical history that includes anxiety, chronic back pain, clotting disorder, coronary artery disease, depression, recurrent DVT of the left lower extremity, fibromyalgia, rheumatoid arthritis, GERD, gout, myocardial infarction (twice), left CVA in 1999 and a TIA in 1997. His surgical history includes:  Left Acetabular revision (2021), Left Anterior hip revision (08/2018, 10/2018, 2021), Left Hip closed reduction (2020), Left Hip closed reduction (08/2018, 09/2018, 10/2018), Left Total hip arthroplasty (08/2018, 09/2018), spine surgery (2010, 2017), Knee arthroplasty (2013), and Right rotator cuff repair.              Mr. Mabus lives alone in a mobile home, however reports his daughter lives next door and receives home health Nursing for post-surgical wound care for recent hip surgery. Timouthy is able to complete all basic daily tasks such as toileting, grooming, bathing, dressing, and functional transfers at modified independent level, with increased effort and time. He reports difficulty with washing his  feet and in between his toes, even with a long-handled sponge due to his fear of bending forward and "popping his hip out." He receives pre-prepared meals that are shipped to his mobile home and the delivery services leaves the meals inside the house for his ease of access.                      A FULL PHYSICAL ASSESSMENT REVEALS THE FOLLOWING    Existing Equipment:   Single point cane, 4-wheeled walker, shower chair, elevated toilet seat, grab bars (toilet and shower), long handled-sponge, ramp to enter home.  Transfers:   Lindley is able to complete all functional transfers at modified independent level while using both upper extremities to push up from chair to transition from sit to stand.  Lamon completes a stand pivot transfer with cane, when transferring from surface to surface such as chair to chair.   Head and Neck:    A/ROM in all ranges WFL.   Trunk and Pelvis:  A/ROM in all ranges WFL.   Hip: Right hip flexion A/ROM is limited. No active left hip flexion demonstrated. MMT: 3/5 for right hip flexion and 0/5 for left hip flexion. Right and left hip abduction: 5/5 and right and left hip adduction: 3+/5.  Knees:  A/ROM left knee flexion/extension is WFL.  MMT: Right knee flexion: 4/5, Right knee extension: 4-/5, Left knee extension and flexion: 5/5.  Feet and Ankles: Bilateral ankle A/ROM is [facility]. Bilateral ankle dorsiflexion/plantar flexion: 5/5    Upper Extremities: BUE elbow, and wrist and Left shoulder A/ROM is functional in all ranges. MMT is as follows: BUE shoulder flexion: 3/5, BUE shoulder abduction: 3/5, BUE external rotation: 3/5, BUE internal rotation: 5/5, Weak gross grasp bilaterally with decreased fine motor skills due to rheumatoid arthritis.   Weight Shifting Ability:  Weight shifting ability is limited and not adequate to what is needed to prevent skin breakdown.  Skin Integrity:  Mr. Bonsignore reports history of sacral pressure sores. Currently self-treating with  topical cream.   Cognition:  WFL  Activity Tolerance: Mr. Pompey has a history  of falls. His most recent fall occurring yesterday (01/29/20). Mr. Heinlen reports an overall of 4 falls in the past 6 months. Mr. Talton demonstrates decreased activity tolerance, forward trunk and lateral lean with reliance on environmental objects while ambulating from waiting room to ADL room. He indicated that the distance from the waiting room to the ADL room was the most he ever does, or would due to his decrease balance and weakness in his bilateral lower extremities.  GOALS/OBJECTIVE OF SEATING INTERVENTION:  Recommendation: Mr. Skow would benefit from a powered wheelchair for use in his home and in the community. Mr. Toomey is limited by his decreased BUE and BLE strength and bilateral arthritic hands, preventing him from self-propelling a manual wheelchair in his current environment.  A scooter would not be beneficial for Mr. Probus as he does not have adequate space in his home for required turning radius. Mr. Streater is motivated and willing to use his power wheelchair and is physically and mentally able to operate a power wheelchair. A power wheelchair would increase Mr. Pett's safety and independence with daily activities and his quality of life.    I concur with the findings and recommendations contained in this Letter of Medical Necessity.   Following are the specifications for the power wheelchair:  [place] 2 SP-SS Patient has physical and cognitive capabilities to use the recommended power mobility device. Patient's home provides adequate access between rooms, maneuvering space, and surfaces for the operation of a power chair. This chair is more maneuverable than comparably priced traditional power wheelchairs due to the drive wheels being directly at the center of gravity of the user. This maneuverability is required to enable the patient to negotiate the tight turns, small spaces, and obstacles  characteristic of the home environment. The patient is unable to self-propel a manual wheelchair, or operate the steering tiller of a scooter secondary to UE weakness, pain, and arthritis. The patient is unable to safely transfer onto the platform of a scooter, due to instability.   [place] Battery Batteries are required to power the wheelchair for use.  Y1017 Unoccupied Transit  Tie downs needed to latch the chair to his lift during transport.  E1028 Swing away joystick bracket NE/NE+ The swing away joystick mount allows for the joystick to be moved during transfers, as well as allowing the patient to navigate closer to tables and other surfaces to prepare meals.  P1025 Q4 Seat Power Tilt Only The tilt mechanism decreases the need for extensive assistance required for multiple transfers and allows patient to independently position self in the chair. The patient is at high risk for skin breakdown, and requires need to independently pressure relieve. The power recline option allows patient to come to a fully supine position, allowing easier clothing changes. Power recline combined with power tilt allows for more adequate pressure relief than power tilt alone. E2603 Glacial SP Matching Seat Size 21"W or less The patient spends most of the day in the seated position, and is at high risk for skin breakdown. E5277 Synergy Back 16Wx22H This positioning backrest will provide adequate thoracic support, improving patient's seated posture. O2423 Left 2 Post Armrest for Static Seating Full Length [phone] Right 2Post Armrest for Static Seating Full Length [id number] Stealth 10" Comfort Plus Cool Core HR The patient spends most of the day in the seated position, and is at high risk of skin breakdown. This skin protection and positioning cushion will provide appropriate pressure relief and skin protection, while  also helping to stabilize the pelvis in an appropriate position.  E1028 Comfort Plus  Headrest Mtg Brkt Patient requires the headrest to provide posterior support for head and neck while in a tilted position. Without a headrest, patient would experience strain in the neck muscles, reducing patient's capacity to tilt to relieve pressure and reposition.  M3536 Width Extension for 11D Footplates Patient requires footplates for proper placement of feet while operating the power wheelchair.  [place] Through Needed in order to control the tilt function through the joystick.   I have no relationship with either the supplier or manufacturer of the equipment recommended.   If you require any further information concerning Mr. Manter's positioning, independence or mobility needs; or any further information why a lesser device will not work, please do not hesitate to contact me at [place], [place]. [place], [place] [address] [phone] Thank you for this referral,   ____________________        Ailene Ravel OTR/L, CBIS  [phone] (main) [email or web address]                       Plan - 01/30/20 1403    OT Occupational Profile and History Detailed Assessment- Review of Records and additional review of physical, cognitive, psychosocial history related to current functional performance    Occupational performance deficits (Please refer to evaluation for details): ADL's;IADL's;Leisure;Social Participation    Rehab Potential Excellent    Clinical Decision Making Limited treatment options, no task modification necessary    Comorbidities Affecting Occupational Performance: Presence of comorbidities impacting occupational performance    Comorbidities impacting occupational performance description: See past medical history    Modification or Assistance to Complete Evaluation  Min-Moderate modification of tasks or assist with assess necessary to complete eval    OT Frequency One time visit     OT Treatment/Interventions Other (comment)   Wheelchair evaluation only   Consulted and Agree with Plan of Care Patient           Patient will benefit from skilled therapeutic intervention in order to improve the following deficits and impairments:           Visit Diagnosis: Other symptoms and signs involving the musculoskeletal system  Other lack of coordination    Problem List Patient Active Problem List   Diagnosis Date Noted  . Chronic respiratory failure with hypoxia ([place]) 12/22/2019  . Diastolic dysfunction with acute on chronic heart failure ([place]) 12/10/2019  . Ecchymoses, spontaneous 11/29/2019  . Sleep apnea in adult 11/29/2019  . Diuretic-induced hypokalemia 11/24/2019  . Hypokalemia due to inadequate potassium intake 11/22/2019  . Septic arthritis of pelvic region and thigh region ([place]) 11/15/2019  . Prosthetic joint infection of left hip ([place]) 11/08/2019  . Aortic atherosclerosis ([place]) 10/20/2019  . Neuropathy due to HIV ([place]) 08/17/2019  . Benign prostatic hyperplasia 08/04/2019  . Chronic bronchitis, mucopurulent ([place]) 08/01/2019  . Drug-induced polyneuropathy ([place]) 06/14/2019  . Educated about COVID-19 virus infection 06/05/2019  . Personal history of TIA (transient ischemic attack) 04/13/2019  . HIV-1 associated autonomic neuropathy ([place]) 04/13/2019  . Impaired functional mobility, balance, gait, and endurance 04/13/2019  . Cerebellar ataxia in diseases classified elsewhere ([place]) 04/13/2019  . Recurrent major depressive disorder, in partial remission ([place]) 11/23/2018  . Atherosclerosis of coronary artery bypass graft(s), unspecified, with other forms of angina pectoris ([place]) 11/23/2018  . Murmur, cardiac 11/23/2018  . Iron deficiency anemia due to dietary  causes 11/17/2018  . Bilateral leg edema 11/16/2018  . Dislocation of hip prosthesis ([place])   . Avascular necrosis of bone of hip, left ([place]) 08/09/2018  . Eczema 07/01/2018  . Candida rash of groin  07/01/2018  . Depression with anxiety 01/14/2018  . 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 Carotid artery disease ([place]) 11/13/2016  . Stenosis of carotid artery 11/13/2016  . Spinal stenosis of thoracolumbar region 07/02/2016  . ILD (interstitial lung disease) ([place]) 09/13/2015  . Immunocompromised state ([place]) 09/13/2015  . Tinea cruris 09/12/2015  . PCP (pneumocystis jiroveci pneumonia) ([place]) 06/18/2015  . Postinflammatory pulmonary fibrosis ([place]) 05/11/2015  . GERD (gastroesophageal reflux disease)   . Hereditary and idiopathic peripheral neuropathy 09/08/2013  . Erosive esophagitis 12/28/2012  . Allergic rhinitis, cause unspecified 04/28/2012  . Long term current use of anticoagulant therapy 02/03/2012  . Hypertension   . Gout   . Carotid artery occlusion   . Hyperlipidemia with target LDL less than 100   . HIV infection ([place]) 04/08/2011  . Arthritis, rheumatoid ([place]) 04/08/2011  . CLL (chronic lymphoid leukemia) in relapse ([place]) 04/08/2011  . Type 2 diabetes mellitus with diabetic polyneuropathy, without long-term current use of insulin ([place]) 04/02/2011  . CAD (coronary artery disease) of artery bypass graft 02/14/2009  . Herpes genitalis 06/22/2008     Ailene Ravel, OTR/L,CBIS  [phone]  01/30/2020, 2:06 PM  [place] [address] [place], [place], [address] Phone: [phone]   Fax:  [phone]  Name: CHASETON YEPIZ MRN: [id number] Date of Birth: 1951/04/03

## 2020-02-01 DIAGNOSIS — E1142 Type 2 diabetes mellitus with diabetic polyneuropathy: Secondary | ICD-10-CM | POA: Diagnosis not present

## 2020-02-01 DIAGNOSIS — I2510 Atherosclerotic heart disease of native coronary artery without angina pectoris: Secondary | ICD-10-CM | POA: Diagnosis not present

## 2020-02-01 DIAGNOSIS — I10 Essential (primary) hypertension: Secondary | ICD-10-CM | POA: Diagnosis not present

## 2020-02-01 DIAGNOSIS — B952 Enterococcus as the cause of diseases classified elsewhere: Secondary | ICD-10-CM | POA: Diagnosis not present

## 2020-02-01 DIAGNOSIS — T8452XD Infection and inflammatory reaction due to internal left hip prosthesis, subsequent encounter: Secondary | ICD-10-CM | POA: Diagnosis not present

## 2020-02-01 DIAGNOSIS — T84031D Mechanical loosening of internal left hip prosthetic joint, subsequent encounter: Secondary | ICD-10-CM | POA: Diagnosis not present

## 2020-02-01 NOTE — Telephone Encounter (Signed)
I have sent another message to [name]/[name]/[name]/Pam at [employer] asking for status.

## 2020-02-02 ENCOUNTER — Ambulatory Visit (INDEPENDENT_AMBULATORY_CARE_PROVIDER_SITE_OTHER): Payer: Medicare Other | Admitting: Orthopaedic Surgery

## 2020-02-02 ENCOUNTER — Ambulatory Visit: Payer: Self-pay

## 2020-02-02 ENCOUNTER — Encounter: Payer: Self-pay | Admitting: Orthopaedic Surgery

## 2020-02-02 DIAGNOSIS — H532 Diplopia: Secondary | ICD-10-CM | POA: Diagnosis not present

## 2020-02-02 DIAGNOSIS — E119 Type 2 diabetes mellitus without complications: Secondary | ICD-10-CM | POA: Diagnosis not present

## 2020-02-02 DIAGNOSIS — Z96649 Presence of unspecified artificial hip joint: Secondary | ICD-10-CM | POA: Diagnosis not present

## 2020-02-02 DIAGNOSIS — Z96642 Presence of left artificial hip joint: Secondary | ICD-10-CM

## 2020-02-02 DIAGNOSIS — T8452XD Infection and inflammatory reaction due to internal left hip prosthesis, subsequent encounter: Secondary | ICD-10-CM | POA: Diagnosis not present

## 2020-02-02 DIAGNOSIS — H52203 Unspecified astigmatism, bilateral: Secondary | ICD-10-CM | POA: Diagnosis not present

## 2020-02-02 DIAGNOSIS — H40023 Open angle with borderline findings, high risk, bilateral: Secondary | ICD-10-CM | POA: Diagnosis not present

## 2020-02-02 NOTE — Progress Notes (Signed)
The patient is right at 3 months status post revision of a failed left total hip arthroplasty.  He had multiple failures of this hip replacement secondary to severe kyphosis of his lumbar spine combined with a fusion of the lumbar spine to the pelvis and sacrum.  This made for quite a difficult total hip arthroplasty and in light of her best efforts he had dislocation of his hip.  We eventually were able to place a constrained liner and it was stable.  He then had a series of hard mechanical falls in the hip and the acetabulum component medially started to become a protrusio type of positioning and migrating.  A CT scan of the pelvis confirmed fractures as well as failure of the acetabulum component.  We took him to the operating room in June of this year and perform a revision of the acetabulum component and were able to put a larger cup and bring it back out laterally with bone grafting and then place a dual mobility liner as well.  He then subsequently developed a postop infection.  He denies any drainage now at this point.  He did have a hard mechanical fall recently and we will x-ray his hip today to make sure that he is going fine with the hip.  He does state that he feels like the hip replacement with a constrained liner was more stable than the dual mobility hip that he has in place now.  He feels like that hip is trying to slide out of place.  Examination of his left hip today shows it moves smoothly and fluidly and it felt stable my exam.  I do not want to discount what he is feeling though.  His incision is finally healed over completely and there is no drainage.  X-rays of the pelvis showed that the acetabulum component is in good position and has not changed from previous films.  There is no evidence of fracture or malalignment.  At this point I want him to try to avoid any activities that he states causes the hip to feel like it may slide out of place.  The next time I need to see him back is  really not in for 3 months.  If there is any issues before then we will certainly find out about it or he will let us know.  At his visit in 3 months I would like an AP and lateral of the left hip.

## 2020-02-03 NOTE — Telephone Encounter (Signed)
Sent secure e-mail to [facility] to check this for me.

## 2020-02-07 NOTE — Telephone Encounter (Signed)
Just spoke to [place].  She Chemical engineer in [place] spoke to Sales promotion account executive in [place] and they are going to [place] machine to pt so test can be done.  They will be contacting pt to make him aware.  Will route message back to triage so they can make pt aware thru MyChart that [place] office will be contacting him.

## 2020-02-08 ENCOUNTER — Other Ambulatory Visit: Payer: Self-pay | Admitting: Adult Health

## 2020-02-08 DIAGNOSIS — E1142 Type 2 diabetes mellitus with diabetic polyneuropathy: Secondary | ICD-10-CM | POA: Diagnosis not present

## 2020-02-08 DIAGNOSIS — T84031D Mechanical loosening of internal left hip prosthetic joint, subsequent encounter: Secondary | ICD-10-CM | POA: Diagnosis not present

## 2020-02-08 DIAGNOSIS — I10 Essential (primary) hypertension: Secondary | ICD-10-CM | POA: Diagnosis not present

## 2020-02-08 DIAGNOSIS — B952 Enterococcus as the cause of diseases classified elsewhere: Secondary | ICD-10-CM | POA: Diagnosis not present

## 2020-02-08 DIAGNOSIS — T8452XD Infection and inflammatory reaction due to internal left hip prosthesis, subsequent encounter: Secondary | ICD-10-CM | POA: Diagnosis not present

## 2020-02-08 DIAGNOSIS — I2510 Atherosclerotic heart disease of native coronary artery without angina pectoris: Secondary | ICD-10-CM | POA: Diagnosis not present

## 2020-02-10 DIAGNOSIS — E663 Overweight: Secondary | ICD-10-CM | POA: Diagnosis not present

## 2020-02-10 DIAGNOSIS — B59 Pneumocystosis: Secondary | ICD-10-CM | POA: Diagnosis not present

## 2020-02-10 DIAGNOSIS — R29818 Other symptoms and signs involving the nervous system: Secondary | ICD-10-CM | POA: Diagnosis not present

## 2020-02-10 DIAGNOSIS — Z21 Asymptomatic human immunodeficiency virus [HIV] infection status: Secondary | ICD-10-CM | POA: Diagnosis not present

## 2020-02-10 DIAGNOSIS — M1A09X0 Idiopathic chronic gout, multiple sites, without tophus (tophi): Secondary | ICD-10-CM | POA: Diagnosis not present

## 2020-02-10 DIAGNOSIS — M2550 Pain in unspecified joint: Secondary | ICD-10-CM | POA: Diagnosis not present

## 2020-02-10 DIAGNOSIS — J849 Interstitial pulmonary disease, unspecified: Secondary | ICD-10-CM | POA: Diagnosis not present

## 2020-02-10 DIAGNOSIS — Z6825 Body mass index (BMI) 25.0-25.9, adult: Secondary | ICD-10-CM | POA: Diagnosis not present

## 2020-02-10 DIAGNOSIS — C9110 Chronic lymphocytic leukemia of B-cell type not having achieved remission: Secondary | ICD-10-CM | POA: Diagnosis not present

## 2020-02-10 DIAGNOSIS — M0609 Rheumatoid arthritis without rheumatoid factor, multiple sites: Secondary | ICD-10-CM | POA: Diagnosis not present

## 2020-02-10 DIAGNOSIS — M150 Primary generalized (osteo)arthritis: Secondary | ICD-10-CM | POA: Diagnosis not present

## 2020-02-11 ENCOUNTER — Other Ambulatory Visit: Payer: Self-pay | Admitting: Urology

## 2020-02-12 ENCOUNTER — Encounter: Payer: Self-pay | Admitting: Internal Medicine

## 2020-02-13 ENCOUNTER — Ambulatory Visit
Admission: RE | Admit: 2020-02-13 | Discharge: 2020-02-13 | Disposition: A | Discharge status: Home / Self Care | Payer: Medicare Other | Source: Ambulatory Visit | Attending: Adult Health | Admitting: Adult Health

## 2020-02-13 ENCOUNTER — Other Ambulatory Visit: Payer: Self-pay

## 2020-02-13 ENCOUNTER — Ambulatory Visit
Admission: EM | Admit: 2020-02-13 | Discharge: 2020-02-13 | Disposition: A | Discharge status: Home / Self Care | Payer: Medicare Other | Attending: Emergency Medicine | Admitting: Emergency Medicine

## 2020-02-13 DIAGNOSIS — L03317 Cellulitis of buttock: Secondary | ICD-10-CM | POA: Diagnosis not present

## 2020-02-13 DIAGNOSIS — J849 Interstitial pulmonary disease, unspecified: Secondary | ICD-10-CM

## 2020-02-13 DIAGNOSIS — K449 Diaphragmatic hernia without obstruction or gangrene: Secondary | ICD-10-CM | POA: Diagnosis not present

## 2020-02-13 DIAGNOSIS — J984 Other disorders of lung: Secondary | ICD-10-CM | POA: Diagnosis not present

## 2020-02-13 DIAGNOSIS — L0231 Cutaneous abscess of buttock: Secondary | ICD-10-CM

## 2020-02-13 DIAGNOSIS — J84112 Idiopathic pulmonary fibrosis: Secondary | ICD-10-CM | POA: Diagnosis not present

## 2020-02-13 DIAGNOSIS — J90 Pleural effusion, not elsewhere classified: Secondary | ICD-10-CM | POA: Diagnosis not present

## 2020-02-13 MED ORDER — SULFAMETHOXAZOLE-TRIMETHOPRIM 800-160 MG PO TABS: 1.0000 | ORAL_TABLET | Freq: Two times a day (BID) | ORAL | 0 refills | Status: AC

## 2020-02-13 NOTE — ED Provider Notes (Signed)
[facility]    CSN: [id number] Arrival date & time: 02/13/20  1109      History   Chief Complaint Chief Complaint  Patient presents with  . Abscess    HPI [name] is a 69 y.o. male.   HPI  Past Medical History:  Diagnosis Date  . Allergy   . Anxiety   . Carotid artery occlusion    40-60% right ICA stenosis (09/2008)  . Cataract   . Chronic back pain   . CLL (chronic lymphoblastic leukemia) dx 2010   Followed at [facility] q30mo, no current therapy   . Clotting disorder ([place])   . Coronary artery disease 2010   s/p CABG '10, sees Dr. Percival Spanish  . Depression   . Diverticulosis   . DVT, lower extremity, recurrent ([place]) 2008, 2009   LLE, chronic anticoag since 2009  . Esophagitis   . Fibromyalgia   . Gallstones   . GERD (gastroesophageal reflux disease)   . Gout   . Gynecomastia, male   . H/O hiatal hernia 2008   surgery  . Hemorrhoids   . Hepatitis A yrs ago  . HIV infection ([place]) dx 1993  . Hypertension   . Impotence of organic origin   . Myocardial infarction ([place]) 2010    x 2  . Neuromuscular disorder (HCC)    neuropathy  . Osteoarthritis, knee    s/p B TKA  . Osteoporosis   . PONV (postoperative nausea and vomiting)   . Pulmonary fibrosis ([place]) 2017  . Rheumatoid arthritis(714.0) dx 2010   MTX, follows with rheum  . Seasonal allergies   . Secondary syphilis 07/24/14 dx   s/p 2 wks doxy  . Status post dilation of esophageal narrowing   . Stroke [facility]) 1969   TIA  . TIA (transient ischemic attack) 1997   mild residual L mouth droop  . Tubular adenoma of colon     Patient Active Problem List   Diagnosis Date Noted  . Chronic respiratory failure with hypoxia ([place]) 12/22/2019  . Diastolic dysfunction with acute on chronic heart failure ([place]) 12/10/2019  . Ecchymoses, spontaneous 11/29/2019  . Sleep apnea in adult 11/29/2019  . Diuretic-induced hypokalemia 11/24/2019  . Hypokalemia due to inadequate potassium intake 11/22/2019  .  Septic arthritis of pelvic region and thigh region ([place]) 11/15/2019  . Prosthetic joint infection of left hip ([place]) 11/08/2019  . Aortic atherosclerosis ([place]) 10/20/2019  . Neuropathy due to HIV ([place]) 08/17/2019  . Benign prostatic hyperplasia 08/04/2019  . Chronic bronchitis, mucopurulent ([place]) 08/01/2019  . Drug-induced polyneuropathy ([place]) 06/14/2019  . Educated about COVID-19 virus infection 06/05/2019  . Personal history of TIA (transient ischemic attack) 04/13/2019  . HIV-1 associated autonomic neuropathy ([place]) 04/13/2019  . Impaired functional mobility, balance, gait, and endurance 04/13/2019  . Cerebellar ataxia in diseases classified elsewhere ([place]) 04/13/2019  . Recurrent major depressive disorder, in partial remission ([place]) 11/23/2018  . Atherosclerosis of coronary artery bypass graft(s), unspecified, with other forms of angina pectoris ([place]) 11/23/2018  . Murmur, cardiac 11/23/2018  . Iron deficiency anemia due to dietary causes 11/17/2018  . Bilateral leg edema 11/16/2018  . Dislocation of hip prosthesis ([place])   . Avascular necrosis of bone of hip, left ([place]) 08/09/2018  . Eczema 07/01/2018  . Candida rash of groin 07/01/2018  . Depression with anxiety 01/14/2018  . 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 Carotid artery disease ([place]) 11/13/2016  . Stenosis of carotid artery 11/13/2016  . Spinal stenosis of  thoracolumbar region 07/02/2016  . ILD (interstitial lung disease) ([place]) 09/13/2015  . Immunocompromised state ([place]) 09/13/2015  . Tinea cruris 09/12/2015  . PCP (pneumocystis jiroveci pneumonia) ([place]) 06/18/2015  . Postinflammatory pulmonary fibrosis ([place]) 05/11/2015  . GERD (gastroesophageal reflux disease)   . Hereditary and idiopathic peripheral neuropathy 09/08/2013  . Erosive esophagitis 12/28/2012  . Allergic rhinitis, cause unspecified 04/28/2012  . Long term current use of anticoagulant therapy 02/03/2012  . Hypertension   . Gout   . Carotid artery  occlusion   . Hyperlipidemia with target LDL less than 100   . HIV infection ([place]) 04/08/2011  . Arthritis, rheumatoid ([place]) 04/08/2011  . CLL (chronic lymphoid leukemia) in relapse ([place]) 04/08/2011  . Type 2 diabetes mellitus with diabetic polyneuropathy, without long-term current use of insulin ([place]) 04/02/2011  . CAD (coronary artery disease) of artery bypass graft 02/14/2009  . Herpes genitalis 06/22/2008    Past Surgical History:  Procedure Laterality Date  . ACETABULAR REVISION Left 11/03/2019   REVISION LEFT HIP ACETABULAR COMPONENT (Left Hip)  . ANTERIOR HIP REVISION Left 08/17/2018   Procedure: ANTERIOR LEFT HIP REVISION;  Surgeon: Mcarthur Rossetti, MD;  Location: [place];  Service: Orthopedics;  Laterality: Left;  . ANTERIOR HIP REVISION Left 11/02/2018   Procedure: LEFT HIP CONSTRAINED LINER REVISION;  Surgeon: Mcarthur Rossetti, MD;  Location: [place];  Service: Orthopedics;  Laterality: Left;  . ANTERIOR HIP REVISION Left 11/03/2019   Procedure: REVISION LEFT HIP ACETABULAR COMPONENT;  Surgeon: Mcarthur Rossetti, MD;  Location: [place];  Service: Orthopedics;  Laterality: Left;  . BACK SURGERY  2010 & 2017  . CHOLECYSTECTOMY    . COLONOSCOPY WITH PROPOFOL N/A 12/28/2012   Procedure: COLONOSCOPY WITH PROPOFOL;  Surgeon: Jerene Bears, MD;  [facility];  Service: Gastroenterology;  Laterality: N/A;  . CORONARY ARTERY BYPASS GRAFT  2010   triple bypass  . ESOPHAGOGASTRODUODENOSCOPY (EGD) WITH PROPOFOL N/A 12/28/2012   Procedure: ESOPHAGOGASTRODUODENOSCOPY (EGD) WITH PROPOFOL;  Surgeon: Jerene Bears, MD;  [facility];  Service: Gastroenterology;  Laterality: N/A;  . ESOPHAGOGASTRODUODENOSCOPY (EGD) WITH PROPOFOL N/A 03/15/2013   Procedure: ESOPHAGOGASTRODUODENOSCOPY (EGD) WITH PROPOFOL;  Surgeon: Jerene Bears, MD;  [facility];  Service: Gastroenterology;  Laterality: N/A;  . ESOPHAGOGASTRODUODENOSCOPY (EGD) WITH PROPOFOL N/A 02/07/2016    Procedure: ESOPHAGOGASTRODUODENOSCOPY (EGD) WITH PROPOFOL;  Surgeon: Jerene Bears, MD;  [facility];  Service: Gastroenterology;  Laterality: N/A;  . HARDWARE REMOVAL N/A 07/02/2012   Procedure: HARDWARE REMOVAL;  Surgeon: Elaina Hoops, MD;  Location: [place] [facility];  Service: Neurosurgery;  Laterality: N/A;  . HIATAL HERNIA REPAIR     wrap  . HIP CLOSED REDUCTION Left 08/15/2018   Procedure: CLOSED REDUCTION HIP;  Surgeon: Newt Minion, MD;  Location: [place];  Service: Orthopedics;  Laterality: Left;  . HIP CLOSED REDUCTION Left 08/28/2018   Procedure: CLOSED REDUCTION HIP FOR RECURRENT DISLOCATION AND DRESSING CHANGE;  Surgeon: Jessy Oto, MD;  Location: [place];  Service: Orthopedics;  Laterality: Left;  . HIP CLOSED REDUCTION Left 09/15/2018   Procedure: CLOSED REDUCTION HIP DISLOCATION;  Surgeon: Mcarthur Rossetti, MD;  Location: [place];  Service: Orthopedics;  Laterality: Left;  . HIP CLOSED REDUCTION Left 10/31/2018   Procedure: CLOSED REDUCTION LEFT TOTAL HIP;  Surgeon: Leandrew Koyanagi, MD;  Location: [place];  Service: Orthopedics;  Laterality: Left;  . INGUINAL HERNIA REPAIR Bilateral   . JOINT REPLACEMENT Left 1999  . KNEE ARTHROPLASTY  07/22/2011   Procedure: COMPUTER ASSISTED TOTAL KNEE ARTHROPLASTY;  Surgeon: Meredith Pel, MD;  Location: [place];  Service: Orthopedics;  Laterality: Right;  Right total knee arthroplasty  . MANDIBLE SURGERY Bilateral    tmj  . REPLACEMENT TOTAL KNEE Bilateral   . ring around testicle hernia reapir  184 and 1986   x 2  . ROTATOR CUFF REPAIR Right   . SHOULDER SURGERY Left   . SPINE SURGERY  2010   "rod and screws", "failed", lopwer spine,   . stent to heart x 1  2010  . TONSILLECTOMY    . TOTAL HIP ARTHROPLASTY Left 08/13/2018   Procedure: LEFT TOTAL HIP ARTHROPLASTY ANTERIOR APPROACH;  Surgeon: Mcarthur Rossetti, MD;  Location: [place];  Service: Orthopedics;  Laterality: Left;  . TOTAL HIP ARTHROPLASTY Left 09/17/2018   Procedure:  ANTERIOR LEFT HIP REVISION;  Surgeon: Mcarthur Rossetti, MD;  Location: [place];  Service: Orthopedics;  Laterality: Left;  . UMBILICAL HERNIA REPAIR     x 1  . varicose vein     stripping  . VIDEO BRONCHOSCOPY Bilateral 10/16/2015   Procedure: VIDEO BRONCHOSCOPY WITHOUT FLUORO;  Surgeon: Brand Males, MD;  [facility];  Service: Cardiopulmonary;  Laterality: Bilateral;       Home Medications    Prior to Admission medications   Medication Sig Start Date End Date Taking? Authorizing Provider  acetaminophen (TYLENOL) 500 MG tablet Take 500-1,000 mg by mouth every 6 (six) hours as needed for mild pain or moderate pain.     [provider]  alfuzosin (UROXATRAL) 10 MG 24 hr tablet Take 1 tablet (10 mg total) by mouth daily with breakfast. 12/08/19   McKenzie, Candee Furbish, MD  amoxicillin (AMOXIL) 875 MG tablet Take 1 tablet (875 mg total) by mouth 2 (two) times daily. 01/24/20 02/23/20  Campbell Riches, MD  atorvastatin (LIPITOR) 10 MG tablet Take 1 tablet (10 mg total) by mouth at bedtime. 11/15/19   Angiulli, Lavon Paganini, PA-C  bictegravir-emtricitabine-tenofovir AF (BIKTARVY) 50-200-25 MG TABS tablet Take 1 tablet by mouth daily.  08/18/19   [provider]  clotrimazole-betamethasone (LOTRISONE) cream Apply 1 application topically 2 (two) times daily. 12/09/19   McKenzie, Candee Furbish, MD  darunavir-cobicistat (PREZCOBIX) 800-150 MG tablet Take 1 tablet by mouth daily with breakfast.  09/15/19   [provider]  docusate sodium (COLACE) 100 MG capsule Take 1 capsule (100 mg total) by mouth 2 (two) times daily. 11/15/19   Angiulli, Lavon Paganini, PA-C  EPINEPHrine (EPIPEN 2-PAK) 0.3 mg/0.3 mL IJ SOAJ injection Inject 0.3 mg into the muscle as needed for anaphylaxis.    [provider]  escitalopram (LEXAPRO) 20 MG tablet Take 1 tablet (20 mg total) by mouth daily. 10/09/19   [name], MD  ezetimibe (ZETIA) 10 MG tablet Take 1 tablet (10 mg total) by  mouth daily. Patient taking differently: Take 10 mg by mouth at bedtime.  10/14/19   Kroeger, Lorelee Cover., PA-C  folic acid (FOLVITE) 1 MG tablet Take 1 mg by mouth daily. 09/17/17   [provider]  l-methylfolate-B6-B12 (METANX) 3-35-2 MG TABS tablet TAKE 1 TABLET BY MOUTH 2 (TWO) TIMES DAILY. 01/10/20   Dohmeier, Asencion Partridge, MD  leflunomide (ARAVA) 20 MG tablet Take 20 mg by mouth daily.  07/10/18   [provider]  metoprolol succinate (TOPROL-XL) 50 MG 24 hr tablet Take 1 tablet (50 mg total) by mouth daily. Take with or immediately following a meal. 11/16/19  Angiulli, Lavon Paganini, PA-C  nitroGLYCERIN (NITROSTAT) 0.4 MG SL tablet Place 1 tablet (0.4 mg total) under the tongue every 5 (five) minutes as needed for chest pain. 12/24/17 10/24/20  [name], MD  ondansetron (ZOFRAN) 4 MG tablet Take 4 mg by mouth every 6 (six) hours as needed for nausea.    [provider]  oxyCODONE (OXY IR/ROXICODONE) 5 MG immediate release tablet Take 1-2 tablets (5-10 mg total) by mouth every 6 (six) hours as needed for moderate pain (pain score 4-6). 01/04/20   Mcarthur Rossetti, MD  pantoprazole (PROTONIX) 40 MG tablet Take 1 tablet (40 mg total) by mouth daily. 11/16/19   Angiulli, Lavon Paganini, PA-C  potassium chloride SA (KLOR-CON) 20 MEQ tablet Take 1 tablet (20 mEq total) by mouth 3 (three) times daily. 11/24/19   [name], MD  predniSONE (DELTASONE) 5 MG tablet Take 5 mg by mouth 2 (two) times daily.  11/02/16   [provider]  pregabalin (LYRICA) 200 MG capsule Take 1 capsule (200 mg total) by mouth 2 (two) times daily. 12/19/19   [name], MD  SAVAYSA 30 MG TABS tablet TAKE 1 TABLET (30 MG TOTAL) BY MOUTH DAILY. 11/26/19   [name], MD  silodosin (RAPAFLO) 8 MG CAPS capsule Take 1 capsule (8 mg total) by mouth daily with breakfast. 01/09/20   McKenzie, Candee Furbish, MD  sulfamethoxazole-trimethoprim (BACTRIM DS) 800-160 MG tablet Take 1 tablet by mouth 2 (two) times  daily for 7 days. 02/13/20 02/20/20  Marney Setting, NP  sulfaSALAzine (AZULFIDINE) 500 MG tablet Take 500 mg by mouth in the morning and at bedtime.  09/17/19   [provider]  tamsulosin (FLOMAX) 0.4 MG CAPS capsule TAKE 1 CAPSULE (0.4 MG TOTAL) BY MOUTH DAILY AFTER SUPPER. 01/17/20   McKenzie, Candee Furbish, MD  torsemide (DEMADEX) 20 MG tablet Take 20 mg by mouth daily.    [provider]  valACYclovir (VALTREX) 500 MG tablet TAKE 1 TABLET BY MOUTH EVERY DAY Patient taking differently: Take 500 mg by mouth daily.  10/27/19   Campbell Riches, MD  SAVAYSA 30 MG TABS tablet TAKE 1 TABLET (30 MG TOTAL) BY MOUTH DAILY. Patient taking differently: Take 30 mg by mouth daily.  02/02/19   [name], MD  sulfamethoxazole-trimethoprim (BACTRIM DS) 800-160 MG tablet Take 1 tablet by mouth every 12 (twelve) hours. 03/21/19   [name], MD  torsemide (DEMADEX) 20 MG tablet TAKE 1 TABLET BY MOUTH EVERY DAY Patient taking differently: Take 20 mg by mouth daily.  05/11/19   [name], MD    Family History Family History  Problem Relation Age of Onset  . Breast cancer Mother   . Hypertension Mother   . Hyperlipidemia Mother   . Diabetes Mother   . Prostate cancer Father   . Colon polyps Father   . Hyperlipidemia Father   . Crohn's disease Paternal Aunt   . Diabetes Maternal Grandmother   . Diabetes Brother        x 3  . Heart attack Brother   . Heart disease Brother        x 3  . Heart attack Brother   . Hyperlipidemia Brother        x 3  . Alcohol abuse Daughter   . Drug abuse Daughter   . Asthma Brother   . CVA Brother   . Colon cancer Neg Hx   . Esophageal cancer Neg Hx   .  Rectal cancer Neg Hx   . Stomach cancer Neg Hx     Social History Social History   Tobacco Use  . Smoking status: Never Smoker  . Smokeless tobacco: Never Used  . Tobacco comment: occ wine  Vaping Use  . Vaping Use: Never used  Substance Use Topics  . Alcohol use: Not  Currently    Comment: occasional wine- 1-2 per week  . Drug use: Not Currently    Types: Marijuana    Comment: 1 month ago     Allergies   Golimumab, Orencia [abatacept], Other, Peanut-containing drug products, Morphine, Oxycodone-acetaminophen, and Promethazine hcl   Review of Systems Review of Systems  Constitutional: Negative.   Respiratory: Negative.   Cardiovascular: Negative.   Skin: Positive for wound.       Moderate size abscess to lt upper buttocks area, yellow in color,   Neurological: Negative.      Physical Exam Triage Vital Signs ED Triage Vitals [02/13/20 1126]  Enc Vitals Group     BP      Pulse      Resp      Temp      Temp src      SpO2      Weight      Height      Head Circumference      Peak Flow      Pain Score 7     Pain Loc      Pain Edu?      Excl. in [place]?    No data found.  Updated Vital Signs BP 105/68   Pulse 89   Temp 98 F (36.7 C)   Resp 19   SpO2 94%   Visual Acuity        Physical Exam Cardiovascular:     Rate and Rhythm: Normal rate.  Pulmonary:     Effort: Pulmonary effort is normal.  Skin:    Findings: Erythema and lesion present.     Comments: Lt upper buttocks area dime size yellow in color raised abscess, erythema noted approx 1 cm surrounding area.  Neurological:     Mental Status: He is alert.      UC Treatments / Results  Labs (all labs ordered are listed, but only abnormal results are displayed) Labs Reviewed - No data to display  EKG   Radiology CT Chest High Resolution  Result Date: 02/13/2020 CLINICAL DATA:  Follow-up interstitial lung disease. History of CLL. EXAM: CT CHEST WITHOUT CONTRAST TECHNIQUE: Multidetector CT imaging of the chest was performed following the standard protocol without intravenous contrast. High resolution imaging of the lungs, as well as inspiratory and expiratory imaging, was performed. COMPARISON:  10/18/2019 high-resolution chest CT. FINDINGS: Cardiovascular:  Borderline mild cardiomegaly. Subendomyocardial fat throughout the left ventricular wall is unchanged and compatible with remote myocardial infarction. Three-vessel coronary atherosclerosis status post CABG. No significant pericardial effusion/thickening. Atherosclerotic nonaneurysmal thoracic aorta. Normal caliber pulmonary arteries. Mediastinum/Nodes: No discrete thyroid nodules. Unremarkable esophagus. No axillary adenopathy. Coarsely calcified nonenlarged paratracheal, subcarinal and right hilar nodes, compatible with prior granulomatous disease, unchanged. No pathologically enlarged noncalcified mediastinal or discrete hilar nodes on these noncontrast images. Lungs/Pleura: No pneumothorax. Loculated small basilar left pleural effusion with minimal smooth left pleural thickening and parenchymal banding at the left lung base, unchanged. No right pleural effusion. Subcentimeter calcified bilateral upper lobe granulomas are unchanged. No acute consolidative airspace disease or lung masses. No significant pulmonary nodules. No significant lobular air trapping or evidence of tracheobronchomalacia  on the expiration sequence. There is mild-to-moderate patchy subpleural reticulation and ground-glass opacity in both lungs with associated mild traction bronchiectasis and architectural distortion. There is a basilar predominance to these findings. No frank honeycombing. No compelling interval progression since recent 10/18/2019 chest CT. Findings have progressed mildly since 01/24/2016 chest CT as previously described. Upper abdomen: Small to moderate hiatal hernia. Granulomatous calcifications scattered throughout the liver and spleen. Cholecystectomy. Musculoskeletal: No aggressive appearing focal osseous lesions. Intact sternotomy wires. Soft tissue anchors in the anterior right humeral head. Partially visualized bilateral posterior spinal fusion hardware extending inferiorly from T11 level. Marked lower thoracic  spondylosis. IMPRESSION: 1. Spectrum of findings compatible with basilar predominant fibrotic interstitial lung disease without frank honeycombing and without compelling interval progression since recent 10/18/2019 chest CT. Findings have progressed mildly since 01/24/2016 chest CT, as previously described. Differential includes UIP or fibrotic NSIP. Findings are categorized as probable UIP per consensus guidelines: Diagnosis of Idiopathic Pulmonary Fibrosis: An Official ATS/ERS/JRS/ALAT Clinical Practice Guideline. [place], Iss 5, [phone], Jan 10 2017. 2. Borderline mild cardiomegaly. Evidence of remote myocardial infarction in the left ventricle. 3. Chronic loculated small basilar left pleural effusion with associated pleuroparenchymal scarring. 4. Small to moderate hiatal hernia. 5. Aortic Atherosclerosis (ICD10-I70.0). Electronically Signed   By: Ilona Sorrel M.D.   On: 02/13/2020 09:57    Procedures Incision and Drainage  Date/Time: 02/13/2020 12:20 PM Performed by: Marney Setting, NP Authorized by: Marney Setting, NP   Consent:    Consent obtained:  Verbal   Consent given by:  Patient   Risks discussed:  Bleeding, incomplete drainage and infection   Alternatives discussed:  No treatment Location:    Type:  Abscess   Size:  Dime size to lt buttocks inner anal area  Pre-procedure details:    Skin preparation:  Betadine Anesthesia (see MAR for exact dosages):    Anesthesia method:  Local infiltration   Local anesthetic:  Lidocaine 1% WITH epi Procedure type:    Complexity:  Simple Procedure details:    Needle aspiration: no     Incision types:  Stab incision   Scalpel blade:  15   Wound management:  Irrigated with saline and extensive cleaning   Drainage:  Purulent   Drainage amount:  Moderate   Wound treatment:  Wound left open (not able to place packing ) Post-procedure details:    Patient tolerance of procedure:  Tolerated well, no immediate  complications   (including critical care time)  Medications Ordered in UC Medications - No data to display  Initial Impression / Assessment and Plan / UC Course  I have reviewed the triage vital signs and the nursing notes.  Pertinent labs & imaging results that were available during my care of the patient were reviewed by me and considered in my medical decision making (see chart for details).     Pt is currently taking amox due to hip and understands the risk of multiple abx tx and c diff. Will need to eat yogurt  and take probiotics Has  appoint with pcp on wed and is to have him follow up with abscess area  Pt understands that we was only able to drain the area but does have concerns for burrowing and hardness may need to have this surgically looked at.  Wash dry area well keep covered.  Take tylenol as needed for pain  Final Clinical Impressions(s) / UC Diagnoses   Final diagnoses:  Cellulitis and  abscess of buttock     Discharge Instructions     Pt is currently taking amox due to hip and understands the risk of multiple abx tx and c diff. Will need to eat yogurt  and take probiotics Has  appoint with pcp on wed and is to have him follow up with abscess area  Pt understands that we was only able to drain the area but does have concerns for burrowing and hardness may need to have this surgically looked at.  Wash dry area well keep covered.  Take tylenol as needed for pain       ED Prescriptions    Medication Sig Dispense Auth. Provider   sulfamethoxazole-trimethoprim (BACTRIM DS) 800-160 MG tablet Take 1 tablet by mouth 2 (two) times daily for 7 days. 14 tablet Marney Setting, NP     PDMP not reviewed this encounter.   Marney Setting, NP 02/13/20 1226

## 2020-02-13 NOTE — Discharge Instructions (Addendum)
Pt is currently taking amox due to hip and understands the risk of multiple abx tx and c diff. Will need to eat yogurt  and take probiotics Has  appoint with pcp on wed and is to have him follow up with abscess area  Pt understands that we was only able to drain the area but does have concerns for burrowing and hardness may need to have this surgically looked at.  Wash dry area well keep covered.  Take tylenol as needed for pain

## 2020-02-13 NOTE — ED Triage Notes (Signed)
Pt presents with complaints of abscess x 2 weeks on his left buttock. Pt complains of pain in the area. Area is red and hard. Pt is currently on amoxicillin x 8 weeks for a previous staph infection and has follow up with his provider on Thursday.

## 2020-02-14 DIAGNOSIS — B952 Enterococcus as the cause of diseases classified elsewhere: Secondary | ICD-10-CM | POA: Diagnosis not present

## 2020-02-14 DIAGNOSIS — T8452XD Infection and inflammatory reaction due to internal left hip prosthesis, subsequent encounter: Secondary | ICD-10-CM | POA: Diagnosis not present

## 2020-02-14 DIAGNOSIS — T84031D Mechanical loosening of internal left hip prosthetic joint, subsequent encounter: Secondary | ICD-10-CM | POA: Diagnosis not present

## 2020-02-14 DIAGNOSIS — I10 Essential (primary) hypertension: Secondary | ICD-10-CM | POA: Diagnosis not present

## 2020-02-14 DIAGNOSIS — E1142 Type 2 diabetes mellitus with diabetic polyneuropathy: Secondary | ICD-10-CM | POA: Diagnosis not present

## 2020-02-14 DIAGNOSIS — I2510 Atherosclerotic heart disease of native coronary artery without angina pectoris: Secondary | ICD-10-CM | POA: Diagnosis not present

## 2020-02-15 ENCOUNTER — Other Ambulatory Visit: Payer: Self-pay | Admitting: Internal Medicine

## 2020-02-15 ENCOUNTER — Ambulatory Visit (INDEPENDENT_AMBULATORY_CARE_PROVIDER_SITE_OTHER): Payer: Medicare Other | Admitting: Internal Medicine

## 2020-02-15 ENCOUNTER — Encounter: Payer: Self-pay | Admitting: Internal Medicine

## 2020-02-15 ENCOUNTER — Other Ambulatory Visit: Payer: Self-pay

## 2020-02-15 VITALS — BP 116/70 | HR 79 | Temp 98.2°F | Resp 16 | Ht 67.0 in | Wt 163.0 lb

## 2020-02-15 DIAGNOSIS — T8452XD Infection and inflammatory reaction due to internal left hip prosthesis, subsequent encounter: Secondary | ICD-10-CM | POA: Diagnosis not present

## 2020-02-15 DIAGNOSIS — I2510 Atherosclerotic heart disease of native coronary artery without angina pectoris: Secondary | ICD-10-CM | POA: Diagnosis not present

## 2020-02-15 DIAGNOSIS — Z23 Encounter for immunization: Secondary | ICD-10-CM

## 2020-02-15 DIAGNOSIS — D689 Coagulation defect, unspecified: Secondary | ICD-10-CM | POA: Diagnosis not present

## 2020-02-15 DIAGNOSIS — B20 Human immunodeficiency virus [HIV] disease: Secondary | ICD-10-CM | POA: Diagnosis not present

## 2020-02-15 DIAGNOSIS — R229 Localized swelling, mass and lump, unspecified: Secondary | ICD-10-CM | POA: Insufficient documentation

## 2020-02-15 DIAGNOSIS — E876 Hypokalemia: Secondary | ICD-10-CM

## 2020-02-15 DIAGNOSIS — Z951 Presence of aortocoronary bypass graft: Secondary | ICD-10-CM | POA: Diagnosis not present

## 2020-02-15 DIAGNOSIS — Z96653 Presence of artificial knee joint, bilateral: Secondary | ICD-10-CM | POA: Diagnosis not present

## 2020-02-15 DIAGNOSIS — M069 Rheumatoid arthritis, unspecified: Secondary | ICD-10-CM | POA: Diagnosis not present

## 2020-02-15 DIAGNOSIS — L281 Prurigo nodularis: Secondary | ICD-10-CM | POA: Diagnosis not present

## 2020-02-15 DIAGNOSIS — M797 Fibromyalgia: Secondary | ICD-10-CM | POA: Diagnosis not present

## 2020-02-15 DIAGNOSIS — T84031D Mechanical loosening of internal left hip prosthetic joint, subsequent encounter: Secondary | ICD-10-CM | POA: Diagnosis not present

## 2020-02-15 DIAGNOSIS — D62 Acute posthemorrhagic anemia: Secondary | ICD-10-CM | POA: Diagnosis not present

## 2020-02-15 DIAGNOSIS — C9110 Chronic lymphocytic leukemia of B-cell type not having achieved remission: Secondary | ICD-10-CM | POA: Diagnosis not present

## 2020-02-15 DIAGNOSIS — J849 Interstitial pulmonary disease, unspecified: Secondary | ICD-10-CM | POA: Diagnosis not present

## 2020-02-15 DIAGNOSIS — K5790 Diverticulosis of intestine, part unspecified, without perforation or abscess without bleeding: Secondary | ICD-10-CM | POA: Diagnosis not present

## 2020-02-15 DIAGNOSIS — Z86718 Personal history of other venous thrombosis and embolism: Secondary | ICD-10-CM | POA: Diagnosis not present

## 2020-02-15 DIAGNOSIS — G8929 Other chronic pain: Secondary | ICD-10-CM | POA: Diagnosis not present

## 2020-02-15 DIAGNOSIS — E785 Hyperlipidemia, unspecified: Secondary | ICD-10-CM | POA: Diagnosis not present

## 2020-02-15 DIAGNOSIS — Z8673 Personal history of transient ischemic attack (TIA), and cerebral infarction without residual deficits: Secondary | ICD-10-CM | POA: Diagnosis not present

## 2020-02-15 DIAGNOSIS — L0231 Cutaneous abscess of buttock: Secondary | ICD-10-CM

## 2020-02-15 DIAGNOSIS — B952 Enterococcus as the cause of diseases classified elsewhere: Secondary | ICD-10-CM | POA: Diagnosis not present

## 2020-02-15 DIAGNOSIS — E1136 Type 2 diabetes mellitus with diabetic cataract: Secondary | ICD-10-CM | POA: Diagnosis not present

## 2020-02-15 DIAGNOSIS — E1142 Type 2 diabetes mellitus with diabetic polyneuropathy: Secondary | ICD-10-CM | POA: Diagnosis not present

## 2020-02-15 DIAGNOSIS — H532 Diplopia: Secondary | ICD-10-CM | POA: Diagnosis not present

## 2020-02-15 DIAGNOSIS — F329 Major depressive disorder, single episode, unspecified: Secondary | ICD-10-CM | POA: Diagnosis not present

## 2020-02-15 DIAGNOSIS — I10 Essential (primary) hypertension: Secondary | ICD-10-CM | POA: Diagnosis not present

## 2020-02-15 DIAGNOSIS — F419 Anxiety disorder, unspecified: Secondary | ICD-10-CM | POA: Diagnosis not present

## 2020-02-15 NOTE — Patient Instructions (Signed)
Skin Biopsy, Care After This sheet gives you information about how to care for yourself after your procedure. Your health care provider may also give you more specific instructions. If you have problems or questions, contact your health care provider. What can I expect after the procedure? After the procedure, it is common to have:  Soreness.  Bruising.  Itching. Follow these instructions at home: Biopsy site care Follow instructions from your health care provider about how to take care of your biopsy site. Make sure you:  Wash your hands with soap and water before and after you change your bandage (dressing). If soap and water are not available, use hand sanitizer.  Apply ointment on your biopsy site as directed by your health care provider.  Change your dressing as told by your health care provider.  Leave stitches (sutures), skin glue, or adhesive strips in place. These skin closures may need to stay in place for 2 weeks or longer. If adhesive strip edges start to loosen and curl up, you may trim the loose edges. Do not remove adhesive strips completely unless your health care provider tells you to do that.  If the biopsy area bleeds, apply gentle pressure for 10 minutes. Check your biopsy site every day for signs of infection. Check for:  Redness, swelling, or pain.  Fluid or blood.  Warmth.  Pus or a bad smell.  General instructions  Rest and then return to your normal activities as told by your health care provider.  Take over-the-counter and prescription medicines only as told by your health care provider.  Keep all follow-up visits as told by your health care provider. This is important. Contact a health care provider if:  You have redness, swelling, or pain around your biopsy site.  You have fluid or blood coming from your biopsy site.  Your biopsy site feels warm to the touch.  You have pus or a bad smell coming from your biopsy site.  You have a  fever.  Your sutures, skin glue, or adhesive strips loosen or come off sooner than expected. Get help right away if:  You have bleeding that does not stop with pressure or a dressing. Summary  After the procedure, it is common to have soreness, bruising, and itching at the site.  Follow instructions from your health care provider about how to take care of your biopsy site.  Check your biopsy site every day for signs of infection.  Contact a health care provider if you have redness, swelling, or pain around your biopsy site, or your biopsy site feels warm to the touch.  Keep all follow-up visits as told by your health care provider. This is important. This information is not intended to replace advice given to you by your health care provider. Make sure you discuss any questions you have with your health care provider. Document Revised: 10/26/2017 Document Reviewed: 10/26/2017 Elsevier Patient Education  2020 [employer]  

## 2020-02-15 NOTE — Progress Notes (Signed)
Subjective:  Patient ID: [name], male    DOB: 01/20/1951  Age: 69 y.o. MRN: [id number]  CC: Abscess  This visit occurred during the SARS-CoV-2 public health emergency.  Safety protocols were in place, including screening questions prior to the visit, additional usage of staff PPE, and extensive cleaning of exam room while observing appropriate contact time as indicated for disinfecting solutions.    HPI [name] presents for the complaint of a 3 week history or painful lump over her left buttocks.  He says in the last week he went to an urgent care center and it sounds like there was an attempt to do an incision and drainage but he said a culture was not sent.  There has been no improvement with Bactrim DS.  Outpatient Medications Prior to Visit  Medication Sig Dispense Refill  . acetaminophen (TYLENOL) 500 MG tablet Take 500-1,000 mg by mouth every 6 (six) hours as needed for mild pain or moderate pain.     [name] alfuzosin (UROXATRAL) 10 MG 24 hr tablet Take 1 tablet (10 mg total) by mouth daily with breakfast. 30 tablet 11  . amoxicillin (AMOXIL) 875 MG tablet Take 1 tablet (875 mg total) by mouth 2 (two) times daily. 60 tablet 2  . atorvastatin (LIPITOR) 10 MG tablet Take 1 tablet (10 mg total) by mouth at bedtime. 30 tablet 9  . bictegravir-emtricitabine-tenofovir AF (BIKTARVY) 50-200-25 MG TABS tablet Take 1 tablet by mouth daily.     . darunavir-cobicistat (PREZCOBIX) 800-150 MG tablet Take 1 tablet by mouth daily with breakfast.     . docusate sodium (COLACE) 100 MG capsule Take 1 capsule (100 mg total) by mouth 2 (two) times daily. 10 capsule 0  . EPINEPHrine (EPIPEN 2-PAK) 0.3 mg/0.3 mL IJ SOAJ injection Inject 0.3 mg into the muscle as needed for anaphylaxis.    [name] escitalopram (LEXAPRO) 20 MG tablet Take 1 tablet (20 mg total) by mouth daily. 90 tablet 1  . ezetimibe (ZETIA) 10 MG tablet Take 1 tablet (10 mg total) by mouth daily. (Patient taking differently: Take 10 mg  by mouth at bedtime. ) 90 tablet 3  . folic acid (FOLVITE) 1 MG tablet Take 1 mg by mouth daily.  11  . l-methylfolate-B6-B12 (METANX) 3-35-2 MG TABS tablet TAKE 1 TABLET BY MOUTH 2 (TWO) TIMES DAILY. 180 tablet 1  . leflunomide (ARAVA) 20 MG tablet Take 20 mg by mouth daily.     . metoprolol succinate (TOPROL-XL) 50 MG 24 hr tablet Take 1 tablet (50 mg total) by mouth daily. Take with or immediately following a meal. 30 tablet 0  . nitroGLYCERIN (NITROSTAT) 0.4 MG SL tablet Place 1 tablet (0.4 mg total) under the tongue every 5 (five) minutes as needed for chest pain. 25 tablet 2  . ondansetron (ZOFRAN) 4 MG tablet Take 4 mg by mouth every 6 (six) hours as needed for nausea.    [name] oxyCODONE (OXY IR/ROXICODONE) 5 MG immediate release tablet Take 1-2 tablets (5-10 mg total) by mouth every 6 (six) hours as needed for moderate pain (pain score 4-6). 30 tablet 0  . predniSONE (DELTASONE) 5 MG tablet Take 5 mg by mouth 2 (two) times daily.   1  . pregabalin (LYRICA) 200 MG capsule Take 1 capsule (200 mg total) by mouth 2 (two) times daily. 60 capsule 5  . SAVAYSA 30 MG TABS tablet TAKE 1 TABLET (30 MG TOTAL) BY MOUTH DAILY. 30 tablet 5  . silodosin (RAPAFLO) 8  MG CAPS capsule Take 1 capsule (8 mg total) by mouth daily with breakfast. 30 capsule 3  . sulfamethoxazole-trimethoprim (BACTRIM DS) 800-160 MG tablet Take 1 tablet by mouth 2 (two) times daily for 7 days. 14 tablet 0  . sulfaSALAzine (AZULFIDINE) 500 MG tablet Take 500 mg by mouth in the morning and at bedtime.     . tamsulosin (FLOMAX) 0.4 MG CAPS capsule TAKE 1 CAPSULE (0.4 MG TOTAL) BY MOUTH DAILY AFTER SUPPER. 30 capsule 1  . torsemide (DEMADEX) 20 MG tablet Take 20 mg by mouth daily.    . valACYclovir (VALTREX) 500 MG tablet TAKE 1 TABLET BY MOUTH EVERY DAY (Patient taking differently: Take 500 mg by mouth daily. ) 30 tablet 4  . clotrimazole-betamethasone (LOTRISONE) cream Apply 1 application topically 2 (two) times daily. 30 g 0  .  pantoprazole (PROTONIX) 40 MG tablet Take 1 tablet (40 mg total) by mouth daily. 30 tablet 0  . potassium chloride SA (KLOR-CON) 20 MEQ tablet Take 1 tablet (20 mEq total) by mouth 3 (three) times daily. 270 tablet 0   No facility-administered medications prior to visit.    ROS Review of Systems  Constitutional: Negative for chills, fatigue and fever.  HENT: Negative.  Negative for trouble swallowing.   Eyes: Negative.   Respiratory: Negative for cough and shortness of breath.   Cardiovascular: Negative for chest pain, palpitations and leg swelling.  Gastrointestinal: Negative for diarrhea, nausea and vomiting.  Genitourinary: Negative for difficulty urinating.  Musculoskeletal: Negative.   Skin: Negative.   Neurological: Negative.  Negative for dizziness, weakness and light-headedness.  Hematological: Negative for adenopathy. Does not bruise/bleed easily.  Psychiatric/Behavioral: Negative.     Objective:  BP 116/70   Pulse 79   Temp 98.2 F (36.8 C) (Oral)   Resp 16   Ht 5\' 7"  (1.702 m)   Wt 163 lb (73.9 kg)   SpO2 96%   BMI 25.53 kg/m   BP Readings from Last 3 Encounters:  02/16/20 102/60  02/15/20 116/70  02/13/20 105/68    Wt Readings from Last 3 Encounters:  02/16/20 162 lb 12.8 oz (73.8 kg)  02/15/20 163 lb (73.9 kg)  01/12/20 166 lb 4 oz (75.4 kg)    Physical Exam Vitals reviewed.  Constitutional:      Appearance: Normal appearance.  HENT:     Mouth/Throat:     Mouth: Mucous membranes are moist.  Eyes:     Conjunctiva/sclera: Conjunctivae normal.  Cardiovascular:     Rate and Rhythm: Normal rate and regular rhythm.     Pulses: Normal pulses.  Pulmonary:     Breath sounds: No stridor. No rales.  Abdominal:     General: Abdomen is flat.  Genitourinary:   Musculoskeletal:        General: Normal range of motion.     Cervical back: Neck supple.     Right lower leg: No edema.  Skin:    General: Skin is warm.     Findings: No rash.    Neurological:     Mental Status: He is alert.     Lab Results  Component Value Date   WBC 13.6 (H) 02/16/2020   HGB 10.9 (L) 02/16/2020   HCT 34.3 (L) 02/16/2020   PLT 160.0 02/16/2020   GLUCOSE 118 (H) 12/08/2019   CHOL 214 (H) 08/04/2019   TRIG 180 (H) 08/04/2019   HDL 40 08/04/2019   LDLCALC 142 (H) 08/04/2019   ALT 12 02/16/2020   AST 19 02/16/2020  NA 141 12/08/2019   K 3.3 (L) 12/08/2019   CL 102 12/08/2019   CREATININE 0.89 12/08/2019   BUN 15 12/08/2019   CO2 30 12/08/2019   TSH 1.12 12/08/2019   PSA 1.2 09/21/2019   INR 1.4 (H) 02/16/2020   HGBA1C 4.4 (L) 09/15/2019   MICROALBUR 2.6 (H) 11/16/2018    CT Chest High Resolution  Result Date: 02/13/2020 CLINICAL DATA:  Follow-up interstitial lung disease. History of CLL. EXAM: CT CHEST WITHOUT CONTRAST TECHNIQUE: Multidetector CT imaging of the chest was performed following the standard protocol without intravenous contrast. High resolution imaging of the lungs, as well as inspiratory and expiratory imaging, was performed. COMPARISON:  10/18/2019 high-resolution chest CT. FINDINGS: Cardiovascular: Borderline mild cardiomegaly. Subendomyocardial fat throughout the left ventricular wall is unchanged and compatible with remote myocardial infarction. Three-vessel coronary atherosclerosis status post CABG. No significant pericardial effusion/thickening. Atherosclerotic nonaneurysmal thoracic aorta. Normal caliber pulmonary arteries. Mediastinum/Nodes: No discrete thyroid nodules. Unremarkable esophagus. No axillary adenopathy. Coarsely calcified nonenlarged paratracheal, subcarinal and right hilar nodes, compatible with prior granulomatous disease, unchanged. No pathologically enlarged noncalcified mediastinal or discrete hilar nodes on these noncontrast images. Lungs/Pleura: No pneumothorax. Loculated small basilar left pleural effusion with minimal smooth left pleural thickening and parenchymal banding at the left lung base,  unchanged. No right pleural effusion. Subcentimeter calcified bilateral upper lobe granulomas are unchanged. No acute consolidative airspace disease or lung masses. No significant pulmonary nodules. No significant lobular air trapping or evidence of tracheobronchomalacia on the expiration sequence. There is mild-to-moderate patchy subpleural reticulation and ground-glass opacity in both lungs with associated mild traction bronchiectasis and architectural distortion. There is a basilar predominance to these findings. No frank honeycombing. No compelling interval progression since recent 10/18/2019 chest CT. Findings have progressed mildly since 01/24/2016 chest CT as previously described. Upper abdomen: Small to moderate hiatal hernia. Granulomatous calcifications scattered throughout the liver and spleen. Cholecystectomy. Musculoskeletal: No aggressive appearing focal osseous lesions. Intact sternotomy wires. Soft tissue anchors in the anterior right humeral head. Partially visualized bilateral posterior spinal fusion hardware extending inferiorly from T11 level. Marked lower thoracic spondylosis. IMPRESSION: 1. Spectrum of findings compatible with basilar predominant fibrotic interstitial lung disease without frank honeycombing and without compelling interval progression since recent 10/18/2019 chest CT. Findings have progressed mildly since 01/24/2016 chest CT, as previously described. Differential includes UIP or fibrotic NSIP. Findings are categorized as probable UIP per consensus guidelines: Diagnosis of Idiopathic Pulmonary Fibrosis: An Official ATS/ERS/JRS/ALAT Clinical Practice Guideline. [place], Iss 5, [phone], Jan 10 2017. 2. Borderline mild cardiomegaly. Evidence of remote myocardial infarction in the left ventricle. 3. Chronic loculated small basilar left pleural effusion with associated pleuroparenchymal scarring. 4. Small to moderate hiatal hernia. 5. Aortic Atherosclerosis  (ICD10-I70.0). Electronically Signed   By: Ilona Sorrel M.D.   On: 02/13/2020 09:57   After informed verbal consent was obtained. Using Betadine for cleansing and 1% Lidocaine with epinephrine for anesthetic ( 2.5 cc's used). With sterile technique a 4 mm punch biopsy/incision was used to obtain a biopsy specimen of the lesion.  The biopsy specimen appears to be a cystic structure.  There was no exudate. Hemostasis was obtained by pressure and wound was not sutured.  The cavity was packed with iodoform.  The specimen is labeled and sent to pathology for evaluation. The procedure was well tolerated without complications.  Assessment & Plan:   Jermarion was seen today for abscess.  Diagnoses and all orders for this visit:  Flu  vaccine need -     Flu Vaccine QUAD High Dose(Fluad)  Left buttock abscess- There was no exudate on the incision and drainage.  The culture is unremarkable.  If this was an abscess it has been adequately treated. -     WOUND CULTURE; Future -     WOUND CULTURE -     Dermatology pathology; Future -     Dermatology pathology  Skin mass- I await the results of the biopsy.  He has a hx of CLL. I am concerned this may be a subcutaneous malignancy vs a cyst. -     Dermatology pathology; Future -     Dermatology pathology   I am having Geral L. Soulier "Phil" maintain his predniSONE, folic acid, nitroGLYCERIN, leflunomide, acetaminophen, EPINEPHrine, ondansetron, sulfaSALAzine, Biktarvy, Prezcobix, escitalopram, ezetimibe, valACYclovir, atorvastatin, docusate sodium, metoprolol succinate, Savaysa, alfuzosin, pregabalin, oxyCODONE, l-methylfolate-B6-B12, silodosin, torsemide, amoxicillin, tamsulosin, and sulfamethoxazole-trimethoprim.  No orders of the defined types were placed in this encounter.    Follow-up: Return in about 2 days (around 02/17/2020).  [name], MD

## 2020-02-16 ENCOUNTER — Encounter: Payer: Self-pay | Admitting: Internal Medicine

## 2020-02-16 ENCOUNTER — Ambulatory Visit (INDEPENDENT_AMBULATORY_CARE_PROVIDER_SITE_OTHER): Payer: Medicare Other | Admitting: Internal Medicine

## 2020-02-16 ENCOUNTER — Telehealth: Payer: Self-pay | Admitting: Pharmacy Technician

## 2020-02-16 VITALS — BP 102/60 | HR 76 | Temp 96.5°F | Ht 67.0 in | Wt 162.8 lb

## 2020-02-16 DIAGNOSIS — R634 Abnormal weight loss: Secondary | ICD-10-CM | POA: Diagnosis not present

## 2020-02-16 DIAGNOSIS — Z7901 Long term (current) use of anticoagulants: Secondary | ICD-10-CM | POA: Diagnosis not present

## 2020-02-16 DIAGNOSIS — Z7185 Encounter for immunization safety counseling: Secondary | ICD-10-CM | POA: Diagnosis not present

## 2020-02-16 DIAGNOSIS — I2510 Atherosclerotic heart disease of native coronary artery without angina pectoris: Secondary | ICD-10-CM | POA: Diagnosis not present

## 2020-02-16 DIAGNOSIS — D849 Immunodeficiency, unspecified: Secondary | ICD-10-CM | POA: Diagnosis not present

## 2020-02-16 DIAGNOSIS — M359 Systemic involvement of connective tissue, unspecified: Secondary | ICD-10-CM

## 2020-02-16 DIAGNOSIS — J8489 Other specified interstitial pulmonary diseases: Secondary | ICD-10-CM

## 2020-02-16 LAB — HEPATIC FUNCTION PANEL
ALT: 12 U/L (ref 0–53)
AST: 19 U/L (ref 0–37)
Albumin: 3.5 g/dL (ref 3.5–5.2)
Alkaline Phosphatase: 66 U/L (ref 39–117)
Bilirubin, Direct: 0.2 mg/dL (ref 0.0–0.3)
Total Bilirubin: 0.7 mg/dL (ref 0.2–1.2)
Total Protein: 5.8 g/dL — ABNORMAL LOW (ref 6.0–8.3)

## 2020-02-16 LAB — CBC WITH DIFFERENTIAL/PLATELET
Basophils Absolute: 0.1 10*3/uL (ref 0.0–0.1)
Basophils Relative: 0.5 % (ref 0.0–3.0)
Eosinophils Absolute: 0 10*3/uL (ref 0.0–0.7)
Eosinophils Relative: 0.3 % (ref 0.0–5.0)
HCT: 34.3 % — ABNORMAL LOW (ref 39.0–52.0)
Hemoglobin: 10.9 g/dL — ABNORMAL LOW (ref 13.0–17.0)
Lymphocytes Relative: 83.2 % — ABNORMAL HIGH (ref 12.0–46.0)
Lymphs Abs: 11.3 10*3/uL — ABNORMAL HIGH (ref 0.7–4.0)
MCHC: 31.8 g/dL (ref 30.0–36.0)
MCV: 105.2 fl — ABNORMAL HIGH (ref 78.0–100.0)
Monocytes Absolute: 0.1 10*3/uL (ref 0.1–1.0)
Monocytes Relative: 1 % — ABNORMAL LOW (ref 3.0–12.0)
Neutro Abs: 2 10*3/uL (ref 1.4–7.7)
Neutrophils Relative %: 15 % — ABNORMAL LOW (ref 43.0–77.0)
Platelets: 160 10*3/uL (ref 150.0–400.0)
RBC: 3.26 Mil/uL — ABNORMAL LOW (ref 4.22–5.81)
RDW: 18.3 % — ABNORMAL HIGH (ref 11.5–15.5)
WBC: 13.6 10*3/uL — ABNORMAL HIGH (ref 4.0–10.5)

## 2020-02-16 LAB — APTT: aPTT: 31.3 s (ref 23.4–32.7)

## 2020-02-16 LAB — SARS-COV-2 IGG: SARS-COV-2 IgG: 0.02

## 2020-02-16 LAB — PROTIME-INR
INR: 1.4 ratio — ABNORMAL HIGH (ref 0.8–1.0)
Prothrombin Time: 15.1 s — ABNORMAL HIGH (ref 9.6–13.1)

## 2020-02-16 NOTE — Telephone Encounter (Signed)
Received New start paperwork for [employer]. Will update as we work through the benefits process.  

## 2020-02-16 NOTE — Progress Notes (Signed)
69 yo never smoker with RA followed by [name]  Has HIV and CLL .    Admit date: 06/18/2015 Discharge date: 06/20/2015  Discharge Diagnoses:  Principal Problem:  Community acquired pneumonia Active Problems:  CAD (coronary artery disease) of artery bypass graft  Type II diabetes mellitus with manifestations (HCC)  HIV disease ([place])  Chronic lymphoblastic leukemia ([place])  Hypertension  DVT, lower extremity, recurrent ([place])  Pneumonia  Dyspnea   06/29/2015  f/u ov/Wert re: NSIP presumably due to RA lung dz/ poor control of arthritis chronically / s/p ? CAP  Chief Complaint  Patient presents with  . Follow-up    PFT done today. Pt hospitalized from 06/18/15-06/20/15 with PNA. He feels his breathing has improved since d/c. Cough is prod wit minimal, thick, dark yellow sputum.    mucous now has turned clear p finished abx /min volume in ams  / activity tol back near baseline s resp meds   No obvious day to day or daytime variability or assoc cp or chest tightness, subjective wheeze or overt sinus or hb symptoms. No unusual exp hx or h/o childhood pna/ asthma or knowledge of premature birth.  Sleeping ok without nocturnal  or early am exacerbation  of respiratory  c/o's or need for noct saba. Also denies any obvious fluctuation of symptoms with weather or environmental changes or other aggravating or alleviating factors except as outlined above     OV 09/13/2015  Chief Complaint  Patient presents with  . Follow-up    Pt changing providers from MW to MR for pulmonary fibrosis. Pt c/o DOE. Pt denies cough, CP/tightness.   Is a transfer of care from Dr. Legrand Como wert to Dr. Fuller Plan  69 year old male immunosuppressed on basis of HIV and rheumatoid arthritis. Given necrosis factor Alpha blockade and Imuran. He appears to have CT findings of interstitial lung disease starting in September 2016 and progressing all the way through end of February 2017. A 2015 CT chest  did not show any interstitial lung disease  He reports that December 2016 he reported to pulmonary office for worsening shortness of breath. Noticed to have interstitial lung disease changes. Clinical diagnosis that interstitial lung disease was related to rheumatoid arthritis was made [his HIV viral load was low and CD4 counts were normal all the way since 2010]. This point in time he had been on infliximab for 3 years. A decision was made to add Imuran to the regimen. At this point in time he was not on Bactrim. Subsequently he got sick early every 2017 and was admitted to the hospital with a clinical diagnosis of community acquired pneumonia and treated with antibiotics and discharged. Subsequently readmitted end of February 2017 for 10 days with hypoxemic respiratory failure. Treated with broad-spectrum antibiotics but did not improve. Finally started on Bactrim for presumed Pneumocystis carinii pneumonia according to the patient and then began to improve. This was when he had last CT scan of the chest which comparatively shows progressive ILD changes.  At this point in time he is on immunosuppressive medications. Most recently saw Dr. Riley Churches and infectious diseases his CD4 count has dropped for the first time and he believes this is due to stress. He is compliant with this anti-retroviral therapy Genovaya. He is also on anticoagulation. Overall he is feeling much better physically stronger and less short of breath. Although he still does have shortness of breath with exertion infection in the office he did desaturate.  Walking desat test 185 feet x 3 laps on RA -> desat to 87% at 2nd lap and got dyspneic and wanted to stop  10/25/2015 Follow up: ILD  Patient returns for a 6 week follow-up for ILD. Patient was recently seen by Dr. Chase Caller for worsening shortness of breath since December 2016. He was noticed to have ILD changes on CT chest. Diagnosis with interstitial lung disease related to  rheumatoid arthritis was made. He does have HIV with CD4 counts normal all the way back since 2010. He was started on Imuran at that time. Patient was admitted earlier this year with pneumonia and readmitted in February 2017 for acute hypoxic respiratory failure. He chest in February 2017 showed progressive patchy opacities in both lungs. Repeat CT chest 09/27/2015 showed a significant decrease in groundglass attenuation bilaterally. Fibrotic changes are considered stable since February 2017 but increased since January 2015 Patient returns today after undergoing a bronchoscopy on 10/16/2015. Ultracet been negative to date. Cytology showed benign reactive changes. Patient says overall he is feeling okay. He continues to use oxygen with activity and at bedtime. He denies any flare cough or dyspnea.  Has been having more reflux . Has intermittent dysphagia .  Chokes easily . Previous hx of esophageal surgery .    OV 01/24/2016  Chief Complaint  Patient presents with  . Follow-up    3 month ILD follow up & PFT review - reports breathing is at baseline.  was in [facility] July 2017 for PNA     follow-up interstitial lung disease in te setting of rheumatoid arthriti on Imuran and HIV disease with  Normal CD4 count nd antiretroviratherapy.   he is on HARRT and TNF alpha blockade treatment aocumented below. Overall snce last isit he is doing wll. Dyspnea is improved. In the interim approximately June 2017ot admitted at [facility] of "fluid in lungs" .  Now back tbaeline  PFT is normal but for low dlco that is moderate 65%. This is improved from early 2017 and similar to year ago. CT chest today just mild basal ILD - NSIP pattern - personally visuzalied    OV 10/13/2019 -patient last seen by me in 2017 September.  Is been over 3 years there for this new visit.  Subjective:  Patient ID: [name], male , DOB: [birth year] , age 69 y.o. , MRN: [id number] , ADDRESS: [place] [id number]   10/13/2019 -   Chief Complaint  Patient presents with  . Consult    Doe for years   Immunosuppressed on basis of HIV and rheumatoid arthritis. Given necrosis factor Alpha blockade and Imuran. He appears to have CT findings of interstitial lung disease starting in September 2016 and progressing all the way through end of February 2017. A 2015 CT chest did not show any interstitial lung disease  June 2017 BAL: Eosinophils 10% and lymphocytes 55%.   Normal CD4 count nd antiretroviratherapy.  HPI JOHNNATHAN HAGEMEISTER 69 y.o. -personally seen over 3 years ago.  After that not seen and not followed up.  He says in the interim his HIV continues to be under control with good CD4 counts.  He is compliant with his antiretroviral therapy.  He is no longer on oxygen though for his interstitial lung disease.  His 2017 BAL showed lymphocytes and eosinophils.  Negative for cultures.  He says slow and steadily his dyspnea is gotten worse.  However when we walked in he is only able to walk  half a lap and stop because of hip pain and dyspnea but did not desaturate but got very tachycardic.  He says that his left hip has bothered him probably from a combination of osteoarthritis and rheumatoid arthritis.  He has had hip replacement but now the plastic socket is loose and needs to be repaired.  He is here for preoperative clearance.  He says he is in significant amount of pain.  He wants a surgery done soon.  His recent renal function and nutritional status is adequate.  His hemoglobin recently is also adequate no recent respiratory infections.  His pulse ox on room air at rest is fine.  His last CT scan of the chest and pulmonary function test was all several years ago and is documented below.  He is agreeable to have this restaged.  I explained to him the new antifibrotic indications and non-IPF progressive ILD.  However he wants to have his hip surgery first and wants a clearance.    Simple office  walk 185 feet x  3 laps goal with forehead probe 10/13/2019   O2 used ra  Number laps completed 3 attempted by did only 1 and stopped due to hip pain  Comments about pace slow  Resting Pulse Ox/HR 95% and 84/min  Final Pulse Ox/HR 95% and 112/min  Desaturated </= 88% no  Desaturated <= 3% points no  Got Tachycardic >/= 90/min yes  Symptoms at end of test dspnea nad hip pain  Miscellaneous comments x       Results for Truax, Oval L "PHIL" (MRN [id number]) as of 10/13/2019 10:00  Ref. Range 01/24/2016 08:53  FVC-Pre Latest Units: L 3.46  FVC-%Pred-Pre Latest Units: % 93  FEV1-Pre Latest Units: L 2.68  FEV1-%Pred-Pre Latest Units: % 98  Pre FEV1/FVC ratio Latest Units: % 78  Results for Mand, Zayvion L "PHIL" (MRN [id number]) as of 10/13/2019 10:00  Ref. Range 01/24/2016 08:53  TLC Latest Units: L 5.70  TLC % pred Latest Units: % 94   Results for W[place]olt, Akhilesh L "PHIL" (MRN [id number]) as of 10/13/2019 10:00  Ref. Range 01/24/2016 08:53  FEV1-%Pred-Post Latest Units: % 106    He had a chest x-ray August 01, 2019 that I personally visualized.  Compared to the one in 2020 it appears that he has new onset of streaky atelectasis although he could have had the same thing in 2020 and is just more prominent now.   ROS - Results for Burch, Jlyn L "PHIL" (MRN [id number]) as of 10/13/2019 10:00  Ref. Range 02/18/2016 15:37 05/13/2016 14:05 05/15/2016 08:31 05/16/2016 08:13 05/17/2016 07:01 05/18/2016 04:02 05/19/2016 08:17 05/20/2016 08:36 05/21/2016 05:31 05/23/2016 05:47 06/25/2016 09:39 07/04/2016 07:49 07/09/2016 00:00 07/22/2016 00:00 08/13/2016 09:18 12/25/2016 10:56 02/12/2017 09:47 07/09/2017 09:13 08/13/2017 09:16 11/19/2017 11:42 01/07/2018 08:52 05/13/2018 08:37 08/11/2018 08:48 08/14/2018 01:45 08/16/2018 04:11 08/18/2018 09:33 08/28/2018 14:12 08/28/2018 14:12 09/15/2018 01:02 09/16/2018 08:36 09/18/2018 04:17 09/19/2018 04:40 09/20/2018 04:40 09/26/2018 16:25 10/28/2018 11:04 11/03/2018 05:21 11/16/2018 15:26 11/19/2018 12:50 12/16/2018 09:02  03/24/2019 13:03 06/06/2019 11:56 08/04/2019 09:50 08/16/2019 09:46 09/15/2019 10:15  Hemoglobin Latest Ref Range: 13.0 - 17.0 g/dL 12.7 (L) 12.2 (L) 11.1 (L) 11.4 (L) 11.9 (L) 11.0 (L) 12.5 (L) 12.4 (L) 12.0 (L) 11.5 (L) 13.9 9.6 (L) 9.2 (A) 10.3 (A) 10.6 (L) 12.5 (L) 12.4 (L) 13.6 14.1 13.7 14.3 13.6 13.9 10.0 (L) 10.1 (L) 10.0 (L) 9.2 (L)  10.7 (L) 11.3 (L) 7.2 (L) 8.1 (L) 8.4 (L) 10.1 (L) 11.2 (L) 7.3 (L) 9.5 (L)  10.4 (L) 11.9 (L) 12.8 (L) 12.9 (L) 12.9 (L) 12.3 (L) 13.0   Results for Totaro, Timtohy L "PHIL" (MRN [id number]) as of 10/13/2019 10:00  Ref. Range 05/15/2016 02:48 05/15/2016 08:31 05/16/2016 08:13 05/17/2016 07:01 05/18/2016 04:02 05/19/2016 08:17 05/20/2016 08:36 05/21/2016 05:31 05/23/2016 05:47 06/25/2016 09:39 07/04/2016 07:49 07/09/2016 00:00 08/13/2016 09:19 08/13/2016 09:19 08/14/2016 16:16 12/25/2016 10:56 07/09/2017 09:13 11/19/2017 11:42 11/19/2017 11:48 01/07/2018 08:52 01/21/2018 10:52 05/13/2018 08:37 07/01/2018 10:19 08/11/2018 08:48 08/14/2018 01:45 08/28/2018 14:12 09/15/2018 01:02 09/18/2018 04:17 09/26/2018 16:25 09/26/2018 17:48 10/28/2018 11:04 11/03/2018 05:21 11/16/2018 15:26 11/19/2018 12:50 11/19/2018 13:55 12/02/2018 09:09 12/08/2018 11:07 12/16/2018 09:02 01/31/2019 10:46 03/24/2019 13:03 06/06/2019 11:56 08/04/2019 09:50 08/16/2019 09:46 09/15/2019 10:15  Creatinine Latest Ref Range: 0.61 - 1.24 mg/dL  0.96 0.82 0.70 0.81 0.86 0.80 0.82 0.92 1.05 0.88 0.9 1.1   0.96 1.12 1.20  1.12  0.84  1.05 0.88 0.95 0.68 0.63 0.75  0.83 0.80  0.77  0.88 0.93 0.80 0.83  0.85 1.00 0.90 1.11     Echocardiogram November 30, 2018 -Normal ejection fraction with some amount of diastolic dysfunction mild aortic stenosis.  [In 2016 he had cardiac stress test that showed ejection fraction of 50% with old scar]  Results for Finnell, Rydell L "PHIL" (MRN [id number]) as of 10/13/2019 10:00  Ref. Range 10/16/2015 08:01  Monocyte-Macrophage-Serous Fluid Latest Ref Range: 50 - 90 % 23 (L)  Other Cells, Fluid Latest Units: % MANY  Fluid Type-FCT Unknown BRONCHIAL  ALVEOLAR LAVAGE  Color, Fluid Latest Ref Range: YELLOW  COLORLESS (A)  Total Nucleated Cell Count, Fluid Latest Ref Range: 0 - 1,000 cu mm 115  Lymphs, Fluid Latest Units: % 55  Eos, Fluid Latest Units: % 10  Appearance, Fluid Latest Ref Range: CLEAR  HAZY (A)  Neutrophil Count, Fluid Latest Ref Range: 0 - 25 % 12   Ct Chest High Resolution  Result Date: 01/24/2016 CLINICAL DATA:  69 year old male with shortness of breath on exertion. History of pneumonia in May 2017. History of rheumatoid arthritis. Evaluate for interstitial lung disease. EXAM: CT CHEST WITHOUT CONTRAST TECHNIQUE: Multidetector CT imaging of the chest was performed following the standard protocol without intravenous contrast. High resolution imaging of the lungs, as well as inspiratory and expiratory imaging, was performed. COMPARISON:  Multiple priors, most recently 09/27/2015. FINDINGS: Cardiovascular: Heart size is mildly enlarged. There is no significant pericardial fluid, thickening or pericardial calcification. There is aortic atherosclerosis, as well as atherosclerosis of the great vessels of the mediastinum and the coronary arteries, including calcified atherosclerotic plaque in the left main, left anterior descending, left circumflex and right coronary arteries. Status post median sternotomy for CABG, including LIMA to the LAD. Curvilinear hypoattenuation in the myocardium of the mid to distal LAD territory, suggestive of fibrofatty metaplasia related to prior LAD territory myocardial infarction. Mediastinum/Nodes: There are multiple prominent but nonenlarged mediastinal and bilateral hilar lymph nodes, many of which are densely calcified (nonspecific). Small hiatal hernia. Surgical clips near the gastroesophageal junction. No axillary lymphadenopathy. Lungs/Pleura: High-resolution again demonstrates some patchy areas of peripheral predominant ground-glass attenuation. This has no definitive craniocaudal gradient. Some patchy  areas of associated septal thickening and subpleural reticulation with scattered areas of very mild cylindrical bronchiectasis are also noted, essentially unchanged compared to the prior study. No honeycombing. Inspiratory and expiratory imaging demonstrates some moderate air trapping, indicative of small airways disease. Scarring in the inferior segment of the lingula is unchanged. No acute consolidative airspace disease. No pleural effusions. No definite suspicious appearing pulmonary nodules  or masses are noted. Scattered calcified granulomas. Upper Abdomen: Calcified granuloma [place] in the liver and spleen. Status post cholecystectomy. Aortic atherosclerosis. Musculoskeletal: Median sternotomy wires. IMPRESSION: 1. The appearance of the chest appears essentially unchanged compared to the prior study from 09/27/2015, again most suggestive of underlying nonspecific interstitial pneumonia (NSIP) as a manifestation of rheumatoid lung disease. No definitive findings to suggest UIP at this time. 2. Aortic atherosclerosis, in addition to left main and 3 vessel coronary artery disease. Status post median sternotomy for CABG. 3. Old granulomatous disease, as above. Electronically Signed   By: Vinnie Langton M.D.   On: 01/24/2016 10:16   12/22/2019 Follow up : ILD  Patient presents for a 1-monthfollow-up.  Patient was seen last visit to reestablish for underlying interstitial lung disease.  Patient has multiple comorbidities including rheumatoid arthritis on chronic immunosuppression.  Also HIV and CLL.  Patient was set up for pulmonary function test shows no airflow obstruction or restriction.  Slight drop in FVC since 2017.  FEV1 86%, ratio 77, FVC 81%.  DLCO is stable at 65% similar to 2017.  Patient was set up for high-resolution CT chest unfortunately this is has not been completed today. ONO was positive for desaturations while sleeping . Started on oxygen 2l/m At bedtime  Doing well with this. Sleeps better with  oxygen.  Not active , limited by joint issues and shortness of breath . Has power scooter. No cough .   Hip surgery May 2021 , doing some better still painful. Following with ortho. Done with IV antibiotics.  IMPRESSION: 1. Basilar predominant pattern of subpleural fibrosis appears more organized and progressive from prior exams and may be due to usual interstitial pneumonitis or fibrotic nonspecific interstitial pneumonitis. Findings are indeterminate for UIP per consensus guidelines: Diagnosis of Idiopathic Pulmonary Fibrosis: An Official ATS/ERS/JRS/ALAT Clinical Practice Guideline. [place] Iss 5, [phone] Jan 10 2017. 2. Small left fibrothorax with adjacent scarring and volume loss in the left lower lobe. 3.  Aortic atherosclerosis (ICD10-I70.0).   Electronically Signed   By: MLorin PicketM.D.   On: 10/19/2019 12:34  OV 02/16/2020  Subjective:  Patient ID: PMaryagnes Amos male , DOB: [birth year], age 69y.o. , MRN: [id number], ADDRESS: [address] [place] [id number] JJanith Lima MD Relevant Other Providers: - Dr HPercival Spanish cards; Dr. CJean Rosenthalin orthopedics, Dr. PNicolette Bangneurology, Dr. JBillie Ladein [place] Dr. JRiley Churchesin infectious diseases, Dr. CAsencion PartridgeDohmeier in neurology for drug-induced polyneuropathy, Dr. FZola Buttonand oncology for CLL This Provider for this visit: Treatment Team:  Attending Provider: RBrand Males MD    02/16/2020 -   Chief Complaint  Patient presents with  . Follow-up    Pt had CT performed and is here to discuss results.  Pt states he has been doing okay since last visit. Denies any complaints of SOB, cough, or wheezing.   Immunosuppressed on basis of HIV and rheumatoid arthritis. Given necrosis factor Alpha blockade and Imuran.   #ILD - No ILD OnCT 2015. Onset September 2016 and progressing all the way through end of February 2017. June 2017 BAL: Eosinophils  10% and lymphocytes 55%. Progressive ILD since 2017 as of June 2021 /Oct 2021 (Indeterminate/Prob UIP respectively)   -Uses nighttime oxygen since summer 2021 #HIV  -  Normal CD4 count nd antiretroviratherapy. 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 -presents for follow-up.  He last saw me in June 2021 and after that saw nurse practitioner.  He had 2 CT scans of the chest since then the first in June 2021 and the second 1 in October 2021.  I personally visualized these.  I agree with the findings of indeterminate/probable UIP respectively.  This year his ILD stable on the CT scan but compared to 2017 there is definite progression.  He himself feels overall he is stable.  His issues are mainly related to weight loss.  He says is unintentional he is lost 20 pounds this year.  This because of mobility issues.  He has had hip surgery he is using a walker he has very limited mobility.  In fact we try to walk him 185 height x3 laps but he only walked 1 lap and stop because of pain.  He is pretty cachectic.  He is on multiple medications because of his immunosuppression from HIV.  He is also immunosuppressed by taking immunomodulators for rheumatoid arthritis.  He has had 2 shots of the Covid vaccine but thinks he has to wait 6 months before getting his booster.  He is immunosuppressed.  SYMPTOM SCALE - ILD 02/16/2020 Last Weight  Most recent update: 02/16/2020  9:08 AM   Weight  73.8 kg (162 lb 12.8 oz)             O2 use  ra  Shortness of Breath 0 -> 5 scale with 5 being worst (score 6 If unable to do)  At rest 1  Simple tasks - showers, clothes change, eating, shaving 2  Household (dishes, doing bed, laundry) 3  Shopping 3  Walking level at own pace 5  Walking up Stairs 5  Total (30-36) Dyspnea Score 19  How bad is your cough? 0  How bad is your fatigue 0  How bad is nausea 0  How bad is vomiting?  0  How bad is diarrhea? 3  How bad is anxiety? 0  How bad is depression 0        Simple office walk 185 feet x  3 laps goal with forehead probe 10/13/2019  02/16/2020   O2 used ra ra  Number laps completed 3 attempted by did only 1 and stopped due to hip pain 3 but did only 1 due to leg and back pain  Comments about pace slow avg pace, used walker  Resting Pulse Ox/HR 95% and 84/min 98% and 75/min  Final Pulse Ox/HR 95% and 112/min 96% and 94/min  Desaturated </= 88% no no  Desaturated <= 3% points no no  Got Tachycardic >/= 90/min yes yes  Symptoms at end of test dspnea nad hip pain Moderate dyspnea  Miscellaneous comments x     PFT Results Latest Ref Rng & Units 12/12/2019 01/24/2016 06/29/2015 08/31/2014  FVC-Pre L 2.93 3.46 3.26 3.98  FVC-Predicted Pre % 81 93 87 106  FVC-Post L - 3.52 3.16 4.11  FVC-Predicted Post % - 95 84 109  Pre FEV1/FVC % % 77  78 77 74  Post FEV1/FCV % % - 83 80 78  FEV1-Pre L 2.25 2.68 2.50 2.96  FEV1-Predicted Pre % 86 98 90 106  FEV1-Post L - 2.91 2.51 3.21  DLCO uncorrected ml/min/mmHg 14.36 16.88 14.89 16.15  DLCO UNC% % 65 65 58 63  DLCO corrected ml/min/mmHg 16.75 - 14.69 -  DLCO COR %Predicted % 76 - 57 -  DLVA Predicted % 81 77 81 94  TLC L - 5.70 5.86 5.54  TLC % Predicted % - 94 97 92  RV % Predicted % - 103 106 97   CT Chest High Resolution  Result Date: 02/13/2020 CLINICAL DATA:  Follow-up interstitial lung disease. History of CLL. EXAM: CT CHEST WITHOUT CONTRAST TECHNIQUE: Multidetector CT imaging of the chest was performed following the standard  protocol without intravenous contrast. High resolution imaging of the lungs, as well as inspiratory and expiratory imaging, was performed. COMPARISON:  10/18/2019 high-resolution chest CT. FINDINGS: Cardiovascular: Borderline mild cardiomegaly. Subendomyocardial fat throughout the left ventricular wall is unchanged and compatible with remote myocardial infarction. Three-vessel coronary atherosclerosis status post CABG. No significant pericardial effusion/thickening. Atherosclerotic nonaneurysmal thoracic aorta. Normal caliber pulmonary arteries. Mediastinum/Nodes: No discrete thyroid nodules. Unremarkable esophagus. No axillary adenopathy. Coarsely calcified nonenlarged paratracheal, subcarinal and right hilar nodes, compatible with prior granulomatous disease, unchanged. No pathologically enlarged noncalcified mediastinal or discrete hilar nodes on these noncontrast images. Lungs/Pleura: No pneumothorax. Loculated small basilar left pleural effusion with minimal smooth left pleural thickening and parenchymal banding at the left lung base, unchanged. No right pleural effusion. Subcentimeter calcified bilateral upper lobe granulomas are unchanged. No acute consolidative airspace disease or lung masses. No significant pulmonary nodules. No significant lobular air trapping or evidence of tracheobronchomalacia on the expiration sequence. There is mild-to-moderate patchy subpleural reticulation and ground-glass opacity in both lungs with associated mild traction bronchiectasis and architectural distortion. There is a basilar predominance to these findings. No frank honeycombing. No compelling interval progression since recent 10/18/2019 chest CT. Findings have progressed mildly since 01/24/2016 chest CT as previously described. Upper abdomen: Small to moderate hiatal hernia. Granulomatous calcifications scattered throughout the liver and spleen. Cholecystectomy. Musculoskeletal: No aggressive appearing focal osseous  lesions. Intact sternotomy wires. Soft tissue anchors in the anterior right humeral head. Partially visualized bilateral posterior spinal fusion hardware extending inferiorly from T11 level. Marked lower thoracic spondylosis. IMPRESSION: 1. Spectrum of findings compatible with basilar predominant fibrotic interstitial lung disease without frank honeycombing and without compelling interval progression since recent 10/18/2019 chest CT. Findings have progressed mildly since 01/24/2016 chest CT, as previously described. Differential includes UIP or fibrotic NSIP. Findings are categorized as probable UIP per consensus guidelines: Diagnosis of Idiopathic Pulmonary Fibrosis: An Official ATS/ERS/JRS/ALAT Clinical Practice Guideline. [place], Iss 5, [phone], Jan 10 2017. 2. Borderline mild cardiomegaly. Evidence of remote myocardial infarction in the left ventricle. 3. Chronic loculated small basilar left pleural effusion with associated pleuroparenchymal scarring. 4. Small to moderate hiatal hernia. 5. Aortic Atherosclerosis (ICD10-I70.0). Electronically Signed   By: Ilona Sorrel M.D.   On: 02/13/2020 09:57     ROS - per HPI     has a past medical history of Allergy, Anxiety, Carotid artery occlusion, Cataract, Chronic back pain, CLL (chronic lymphoblastic leukemia) (dx 2010), Clotting disorder ([place]), Coronary artery disease (2010), Depression, Diverticulosis, DVT, lower extremity, recurrent ([place]) (2008, 2009), Esophagitis, Fibromyalgia, Gallstones, GERD (gastroesophageal reflux disease), Gout, Gynecomastia, male, H/O hiatal hernia (2008), Hemorrhoids, Hepatitis A (yrs ago), HIV infection ([place]) (  dx 1993), Hypertension, Impotence of organic origin, Myocardial infarction ([place]) (2010), Neuromuscular disorder ([place]), Osteoarthritis, knee, Osteoporosis, PONV (postoperative nausea and vomiting), Pulmonary fibrosis ([place]) (2017), Rheumatoid arthritis(714.0) (dx 2010), Seasonal allergies, Secondary  syphilis (07/24/14 dx), Status post dilation of esophageal narrowing, Stroke ([place]) (1969), TIA (transient ischemic attack) (1997), and Tubular adenoma of colon.   reports that he has never smoked. He has never used smokeless tobacco.  Past Surgical History:  Procedure Laterality Date  . ACETABULAR REVISION Left 11/03/2019   REVISION LEFT HIP ACETABULAR COMPONENT (Left Hip)  . ANTERIOR HIP REVISION Left 08/17/2018   Procedure: ANTERIOR LEFT HIP REVISION;  Surgeon: Mcarthur Rossetti, MD;  Location: [place];  Service: Orthopedics;  Laterality: Left;  . ANTERIOR HIP REVISION Left 11/02/2018   Procedure: LEFT HIP CONSTRAINED LINER REVISION;  Surgeon: Mcarthur Rossetti, MD;  Location: [place];  Service: Orthopedics;  Laterality: Left;  . ANTERIOR HIP REVISION Left 11/03/2019   Procedure: REVISION LEFT HIP ACETABULAR COMPONENT;  Surgeon: Mcarthur Rossetti, MD;  Location: [place];  Service: Orthopedics;  Laterality: Left;  . BACK SURGERY  2010 & 2017  . CHOLECYSTECTOMY    . COLONOSCOPY WITH PROPOFOL N/A 12/28/2012   Procedure: COLONOSCOPY WITH PROPOFOL;  Surgeon: Jerene Bears, MD;  Location: [facility];  Service: Gastroenterology;  Laterality: N/A;  . CORONARY ARTERY BYPASS GRAFT  2010   triple bypass  . ESOPHAGOGASTRODUODENOSCOPY (EGD) WITH PROPOFOL N/A 12/28/2012   Procedure: ESOPHAGOGASTRODUODENOSCOPY (EGD) WITH PROPOFOL;  Surgeon: Jerene Bears, MD;  [facility]: [facility];  Service: Gastroenterology;  Laterality: N/A;  . ESOPHAGOGASTRODUODENOSCOPY (EGD) WITH PROPOFOL N/A 03/15/2013   Procedure: ESOPHAGOGASTRODUODENOSCOPY (EGD) WITH PROPOFOL;  Surgeon: Jerene Bears, MD;  [facility]: [facility];  Service: Gastroenterology;  Laterality: N/A;  . ESOPHAGOGASTRODUODENOSCOPY (EGD) WITH PROPOFOL N/A 02/07/2016   Procedure: ESOPHAGOGASTRODUODENOSCOPY (EGD) WITH PROPOFOL;  Surgeon: Jerene Bears, MD;  [facility]: [facility];  Service: Gastroenterology;  Laterality: N/A;  . HARDWARE REMOVAL N/A  07/02/2012   Procedure: HARDWARE REMOVAL;  Surgeon: Elaina Hoops, MD;  Location: [facility];  Service: Neurosurgery;  Laterality: N/A;  . HIATAL HERNIA REPAIR     wrap  . HIP CLOSED REDUCTION Left 08/15/2018   Procedure: CLOSED REDUCTION HIP;  Surgeon: Newt Minion, MD;  Location: [place];  Service: Orthopedics;  Laterality: Left;  . HIP CLOSED REDUCTION Left 08/28/2018   Procedure: CLOSED REDUCTION HIP FOR RECURRENT DISLOCATION AND DRESSING CHANGE;  Surgeon: Jessy Oto, MD;  Location: [place];  Service: Orthopedics;  Laterality: Left;  . HIP CLOSED REDUCTION Left 09/15/2018   Procedure: CLOSED REDUCTION HIP DISLOCATION;  Surgeon: Mcarthur Rossetti, MD;  Location: [place];  Service: Orthopedics;  Laterality: Left;  . HIP CLOSED REDUCTION Left 10/31/2018   Procedure: CLOSED REDUCTION LEFT TOTAL HIP;  Surgeon: Leandrew Koyanagi, MD;  Location: [place];  Service: Orthopedics;  Laterality: Left;  . INGUINAL HERNIA REPAIR Bilateral   . JOINT REPLACEMENT Left 1999  . KNEE ARTHROPLASTY  07/22/2011   Procedure: COMPUTER ASSISTED TOTAL KNEE ARTHROPLASTY;  Surgeon: Meredith Pel, MD;  Location: [place];  Service: Orthopedics;  Laterality: Right;  Right total knee arthroplasty  . MANDIBLE SURGERY Bilateral    tmj  . REPLACEMENT TOTAL KNEE Bilateral   . ring around testicle hernia reapir  184 and 1986   x 2  . ROTATOR CUFF REPAIR Right   . SHOULDER SURGERY Left   . SPINE SURGERY  2010   "rod and screws", "failed",  lopwer spine,   . stent to heart x 1  2010  . TONSILLECTOMY    . TOTAL HIP ARTHROPLASTY Left 08/13/2018   Procedure: LEFT TOTAL HIP ARTHROPLASTY ANTERIOR APPROACH;  Surgeon: Mcarthur Rossetti, MD;  Location: [place];  Service: Orthopedics;  Laterality: Left;  . TOTAL HIP ARTHROPLASTY Left 09/17/2018   Procedure: ANTERIOR LEFT HIP REVISION;  Surgeon: Mcarthur Rossetti, MD;  Location: [place];  Service: Orthopedics;  Laterality: Left;  . UMBILICAL HERNIA REPAIR     x 1  . varicose vein      stripping  . VIDEO BRONCHOSCOPY Bilateral 10/16/2015   Procedure: VIDEO BRONCHOSCOPY WITHOUT FLUORO;  Surgeon: Brand Males, MD;  Location: [facility];  Service: Cardiopulmonary;  Laterality: Bilateral;    Allergies  Allergen Reactions  . Golimumab Anaphylaxis    Simponi ARIA  . Orencia [Abatacept] Anaphylaxis  . Other Anaphylaxis and Hives    Pecans  . Peanut-Containing Drug Products Anaphylaxis, Hives and Swelling    Swelling of throat  . Morphine Other (See Comments)    Severe headache- Can tolerate Dilaudid, however  . Oxycodone-Acetaminophen Other (See Comments)    Headache 6.22.2020 patient is currently taking and tolerating  . Promethazine Hcl Other (See Comments)    Makes him feel "drunk" at higher strengths    Immunization History  Administered Date(s) Administered  . Fluad Quad(high Dose 65+) 12/30/2018, 02/15/2020  . H1N1 04/28/2008  . Hepatitis A, Adult 09/07/2007, 03/10/2008  . Hepatitis B, adult 09/08/2013, 10/27/2013, 03/02/2014  . Influenza Split 01/17/2011, 02/11/2012, 02/18/2013, 02/10/2015  . Influenza, High Dose Seasonal PF 02/02/2014, 01/25/2016, 01/14/2018  . Influenza-Unspecified 02/13/2017  . PFIZER SARS-COV-2 Vaccination 06/23/2019, 07/19/2019  . PPD Test 02/22/2007, 06/12/2008  . Pneumococcal Conjugate-13 08/10/2014  . Pneumococcal Polysaccharide-23 05/13/2003, 12/08/2007, 01/16/2009, 10/27/2013  . Td 11/19/2011  . Zoster 03/02/2014    Family History  Problem Relation Age of Onset  . Breast cancer Mother   . Hypertension Mother   . Hyperlipidemia Mother   . Diabetes Mother   . Prostate cancer Father   . Colon polyps Father   . Hyperlipidemia Father   . Crohn's disease Paternal Aunt   . Diabetes Maternal Grandmother   . Diabetes Brother        x 3  . Heart attack Brother   . Heart disease Brother        x 3  . Heart attack Brother   . Hyperlipidemia Brother        x 3  . Alcohol abuse Daughter   . Drug abuse Daughter   .  Asthma Brother   . CVA Brother   . Colon cancer Neg Hx   . Esophageal cancer Neg Hx   . Rectal cancer Neg Hx   . Stomach cancer Neg Hx      Current Outpatient Medications:  .  acetaminophen (TYLENOL) 500 MG tablet, Take 500-1,000 mg by mouth every 6 (six) hours as needed for mild pain or moderate pain. , Disp: , Rfl:  .  alfuzosin (UROXATRAL) 10 MG 24 hr tablet, Take 1 tablet (10 mg total) by mouth daily with breakfast., Disp: 30 tablet, Rfl: 11 .  amoxicillin (AMOXIL) 875 MG tablet, Take 1 tablet (875 mg total) by mouth 2 (two) times daily., Disp: 60 tablet, Rfl: 2 .  atorvastatin (LIPITOR) 10 MG tablet, Take 1 tablet (10 mg total) by mouth at bedtime., Disp: 30 tablet, Rfl: 9 .  bictegravir-emtricitabine-tenofovir AF (BIKTARVY) 50-200-25 MG TABS tablet, Take 1 tablet  by mouth daily. , Disp: , Rfl:  .  darunavir-cobicistat (PREZCOBIX) 800-150 MG tablet, Take 1 tablet by mouth daily with breakfast. , Disp: , Rfl:  .  docusate sodium (COLACE) 100 MG capsule, Take 1 capsule (100 mg total) by mouth 2 (two) times daily., Disp: 10 capsule, Rfl: 0 .  EPINEPHrine (EPIPEN 2-PAK) 0.3 mg/0.3 mL IJ SOAJ injection, Inject 0.3 mg into the muscle as needed for anaphylaxis., Disp: , Rfl:  .  escitalopram (LEXAPRO) 20 MG tablet, Take 1 tablet (20 mg total) by mouth daily., Disp: 90 tablet, Rfl: 1 .  ezetimibe (ZETIA) 10 MG tablet, Take 1 tablet (10 mg total) by mouth daily. (Patient taking differently: Take 10 mg by mouth at bedtime. ), Disp: 90 tablet, Rfl: 3 .  folic acid (FOLVITE) 1 MG tablet, Take 1 mg by mouth daily., Disp: , Rfl: 11 .  KLOR-CON M20 20 MEQ tablet, TAKE 1 TABLET (20 MEQ TOTAL) BY MOUTH 3 (THREE) TIMES DAILY., Disp: 270 tablet, Rfl: 0 .  l-methylfolate-B6-B12 (METANX) 3-35-2 MG TABS tablet, TAKE 1 TABLET BY MOUTH 2 (TWO) TIMES DAILY., Disp: 180 tablet, Rfl: 1 .  leflunomide (ARAVA) 20 MG tablet, Take 20 mg by mouth daily. , Disp: , Rfl:  .  metoprolol succinate (TOPROL-XL) 50 MG 24 hr  tablet, Take 1 tablet (50 mg total) by mouth daily. Take with or immediately following a meal., Disp: 30 tablet, Rfl: 0 .  nitroGLYCERIN (NITROSTAT) 0.4 MG SL tablet, Place 1 tablet (0.4 mg total) under the tongue every 5 (five) minutes as needed for chest pain., Disp: 25 tablet, Rfl: 2 .  ondansetron (ZOFRAN) 4 MG tablet, Take 4 mg by mouth every 6 (six) hours as needed for nausea., Disp: , Rfl:  .  oxyCODONE (OXY IR/ROXICODONE) 5 MG immediate release tablet, Take 1-2 tablets (5-10 mg total) by mouth every 6 (six) hours as needed for moderate pain (pain score 4-6)., Disp: 30 tablet, Rfl: 0 .  pregabalin (LYRICA) 200 MG capsule, Take 1 capsule (200 mg total) by mouth 2 (two) times daily., Disp: 60 capsule, Rfl: 5 .  SAVAYSA 30 MG TABS tablet, TAKE 1 TABLET (30 MG TOTAL) BY MOUTH DAILY., Disp: 30 tablet, Rfl: 5 .  silodosin (RAPAFLO) 8 MG CAPS capsule, Take 1 capsule (8 mg total) by mouth daily with breakfast., Disp: 30 capsule, Rfl: 3 .  sulfamethoxazole-trimethoprim (BACTRIM DS) 800-160 MG tablet, Take 1 tablet by mouth 2 (two) times daily for 7 days., Disp: 14 tablet, Rfl: 0 .  sulfaSALAzine (AZULFIDINE) 500 MG tablet, Take 500 mg by mouth in the morning and at bedtime. , Disp: , Rfl:  .  tamsulosin (FLOMAX) 0.4 MG CAPS capsule, TAKE 1 CAPSULE (0.4 MG TOTAL) BY MOUTH DAILY AFTER SUPPER., Disp: 30 capsule, Rfl: 1 .  torsemide (DEMADEX) 20 MG tablet, Take 20 mg by mouth daily., Disp: , Rfl:  .  valACYclovir (VALTREX) 500 MG tablet, TAKE 1 TABLET BY MOUTH EVERY DAY (Patient taking differently: Take 500 mg by mouth daily. ), Disp: 30 tablet, Rfl: 4 .  ezetimibe (ZETIA) 10 MG tablet, Take 1 tablet (10 mg total) by mouth daily. (Patient taking differently: Take 10 mg by mouth at bedtime. ), Disp: 90 tablet, Rfl: 3 .  leflunomide (ARAVA) 20 MG tablet, Take 20 mg by mouth daily. , Disp: , Rfl:  .  nitroGLYCERIN (NITROSTAT) 0.4 MG SL tablet, Place 1 tablet (0.4 mg total) under the tongue every 5 (five)  minutes as needed for chest pain., Disp:  25 tablet, Rfl: 2 .  predniSONE (DELTASONE) 5 MG tablet, Take 5 mg by mouth 2 (two) times daily. , Disp: , Rfl: 1 .  sulfaSALAzine (AZULFIDINE) 500 MG tablet, Take 500 mg by mouth in the morning and at bedtime. , Disp: , Rfl:       Objective:   Vitals:   02/16/20 0907  BP: 102/60  Pulse: 76  Temp: (!) 96.5 F (35.8 C)  TempSrc: Other (Comment)  SpO2: 97%  Weight: 162 lb 12.8 oz (73.8 kg)  Height: _0  (1.702 m)    Estimated body mass index is 25.5 kg/m as calculated from the following:   Height as of this encounter: _1  (1.702 m).   Weight as of this encounter: 162 lb 12.8 oz (73.8 kg).  _2 @  Filed Weights   02/16/20 0907  Weight: 162 lb 12.8 oz (73.8 kg)     Physical Exam Frail male with a walker.  He is kyphotic his scaphoid.  He is cachectic.  He is got a scar on his mid back of previous spine surgery.  He has significant rheumatoid arthritis deformities.  Despite this he dropped today.  Alert and oriented x3 Velcro crackles present.  Has a beard normal oral cavity.    Assessment:       ICD-10-CM   1. Interstitial lung disease due to connective tissue disease ([place])  J84.89    M35.9   2. Immunocompromised state ([place])  D84.9   3. Vaccine counseling  Z71.85   4. Recent unintentional weight loss over several months  R63.4    I explained to him that he has progressive ILD over the last 4 years.  This meets indication for antifibrotic's.  Discussed the 2 antifibrotic's of nintedanib and pirfenidone.  Nintedanib is better studied in the setting of non-- IPF progressive ILD.  However he has some amount of diarrhea he also has weight loss and is on anticoagulation and has had previous coronary artery disease.  Therefore I recommended against this as first-line  Esbriet is well studied with IPF but not with non-- IPF but no reason to think it will not work.  It can also cause weight loss and low appetite but less of diarrhea.   Also no known coronary artery disease or anticoagulant side effects.  Therefore he prefers this.  I did caution him that with a significant amount of weight loss and polypharmacy and frail lady that overall he may not tolerate these medications.  Nevertheless he wants to take this.  Therefore we have written a prescription after extensive counseling  His last bronchoscopy was in 2017 he had a lot of lymphocytes.  He has been on immunosuppressants then and this is ongoing.  He has progressive ILD.  Therefore I think he meets indications for bronchoscopy with lavage to rule out opportunistic infections.  He is agreeable to this.Risks of pneumothorax, hemothorax, sedation/anesthesia complications such as cardiac or respiratory arrest or hypotension, stroke and bleeding all explained. Benefits of diagnosis but limitations of non-diagnosis also explained. Patient verbalized understanding and wished to proceed.      Plan:     Patient Instructions     ICD-10-CM   1. Interstitial lung disease due to connective tissue disease ([place])  J84.89    M35.9   2. Immunocompromised state ([place])  D84.9   3. Vaccine counseling  Z71.85   4. Recent unintentional weight loss over several months  R63.4     You have slowly progressive interstitial lung disease  between 2017 and 2021 Within 2021 it seems stable Most likely this is due to rheumatoid arthritis causing progressive pulmonary fibrosis over time Opportunistic infections in the setting of immunosuppressed status is another possibility  Being immunosuppressed you might not have mounted a full antibody response to you to Covid vaccine  Plan -Get CBC, chemistry, liver function, PT, PTT test blood work -Get Covid IgG blood work > if negative then you might need a booster sooner than the later -We will look at scheduling a bronchoscopy with lavage to rule out opportunistic infections  -We will aim for sometime between February 27, 2020 and March 01, 2020 at  [facility]  Await further instructions in the next few to several days -Start pirfenidone [we discussed this extensively)  -I will just initially unintentional weight loss go for the lower dose given his significant recent weight loss.   - needs insurance and charity approval    FOLLOWUP  - 6 WEEKS WITH Dr Chase Caller in 30 min slot   ( Level 05 visit: Estb 40-54 min   in  visit type: on-site physical face to visit  in total care time and counseling or/and coordination of care by this undersigned MD - Dr Brand Males. This includes one or more of the following on this same day 02/16/2020: pre-charting, chart review, note writing, documentation discussion of test results, diagnostic or treatment recommendations, prognosis, risks and benefits of management options, instructions, education, compliance or risk-factor reduction. It excludes time spent by the [facility] or office staff in the care of the patient. Actual time 65 min)   SIGNATURE    Dr. Brand Males, M.D., F.C.C.P,  Pulmonary and Critical Care Medicine Staff Physician, [place] Director - Interstitial Lung Disease  Program  P[facility] [place] [facility] [place], [place], [address]  Pager: [phone], If no answer or between  15:00h - 7:00h: call [phone] Telephone: [phone]  10:09 AM 02/16/2020

## 2020-02-16 NOTE — Telephone Encounter (Signed)
PATIENT: [name] GENDER: male MRN: [id number] DOB: 03-22-1951 ADDRESS: [address] [place] [address]    Please schedule the following:  Diagnosis: ILD, HIV and immunosuppressed Procedure: Video bronchocoopy, flexible bronchoscopy with BAL only [place] Classifer Transbronchial biopsy:  -No biopsy planned unless his endobronchial lesion  anesthesia:  -Moderate sedation only.  Any changes to this, endoscopy suite to discuss directly with Dr. Chase Caller Do you need Fluro?  No Size of Scope: Small scope because of lavage only plan Pre-med nebulized lidocaine: Yes Priority: October 18-21 Monday through Thursday.  Only 7 AM slot.  Any changes to this please check with Dr. Chase Caller  date: As above Alternate Date: As above Time: AM -only 7 AM preferred/ PM not preferred because of delays Location: [place]-if 7 AM s [facility] bronchoscopy suite in the afternoon  does patient have OSA?  No DM?  No or Latex allergy?  No Medication Restriction: Stop edoxaban 3 days before procedure Anticoagulate/Antiplatelet: Stop edoxaban 3 days before procedure.  Aspirin 3 days before procedure Pre-op Labs Ordered: CBC, CMP, PT/INR, PTT -this was done February 16, 2020 Imaging request: none       MISCELLANEOUS KEY INSTRUCTIONS    Please coordinate Pre-op COVID Testing   Please let Dr Chase Caller know via reply phone message on Epic  Thank you     Key patient medical info     Allergy History:  Allergies  Allergen Reactions  . Golimumab Anaphylaxis    Simponi ARIA  . Orencia [Abatacept] Anaphylaxis  . Other Anaphylaxis and Hives    Pecans  . Peanut-Containing Drug Products Anaphylaxis, Hives and Swelling    Swelling of throat  . Morphine Other (See Comments)    Severe headache- Can tolerate Dilaudid, however  . Oxycodone-Acetaminophen Other (See Comments)    Headache 6.22.2020 patient is currently taking and tolerating  . Promethazine Hcl Other (See Comments)    Makes  him feel "drunk" at higher strengths     Current Outpatient Medications:  .  acetaminophen (TYLENOL) 500 MG tablet, Take 500-1,000 mg by mouth every 6 (six) hours as needed for mild pain or moderate pain. , Disp: , Rfl:  .  alfuzosin (UROXATRAL) 10 MG 24 hr tablet, Take 1 tablet (10 mg total) by mouth daily with breakfast., Disp: 30 tablet, Rfl: 11 .  amoxicillin (AMOXIL) 875 MG tablet, Take 1 tablet (875 mg total) by mouth 2 (two) times daily., Disp: 60 tablet, Rfl: 2 .  atorvastatin (LIPITOR) 10 MG tablet, Take 1 tablet (10 mg total) by mouth at bedtime., Disp: 30 tablet, Rfl: 9 .  bictegravir-emtricitabine-tenofovir AF (BIKTARVY) 50-200-25 MG TABS tablet, Take 1 tablet by mouth daily. , Disp: , Rfl:  .  darunavir-cobicistat (PREZCOBIX) 800-150 MG tablet, Take 1 tablet by mouth daily with breakfast. , Disp: , Rfl:  .  docusate sodium (COLACE) 100 MG capsule, Take 1 capsule (100 mg total) by mouth 2 (two) times daily., Disp: 10 capsule, Rfl: 0 .  EPINEPHrine (EPIPEN 2-PAK) 0.3 mg/0.3 mL IJ SOAJ injection, Inject 0.3 mg into the muscle as needed for anaphylaxis., Disp: , Rfl:  .  escitalopram (LEXAPRO) 20 MG tablet, Take 1 tablet (20 mg total) by mouth daily., Disp: 90 tablet, Rfl: 1 .  ezetimibe (ZETIA) 10 MG tablet, Take 1 tablet (10 mg total) by mouth daily. (Patient taking differently: Take 10 mg by mouth at bedtime. ), Disp: 90 tablet, Rfl: 3 .  folic acid (FOLVITE) 1 MG tablet, Take 1 mg by mouth  daily., Disp: , Rfl: 11 .  KLOR-CON M20 20 MEQ tablet, TAKE 1 TABLET (20 MEQ TOTAL) BY MOUTH 3 (THREE) TIMES DAILY., Disp: 270 tablet, Rfl: 0 .  l-methylfolate-B6-B12 (METANX) 3-35-2 MG TABS tablet, TAKE 1 TABLET BY MOUTH 2 (TWO) TIMES DAILY., Disp: 180 tablet, Rfl: 1 .  leflunomide (ARAVA) 20 MG tablet, Take 20 mg by mouth daily. , Disp: , Rfl:  .  metoprolol succinate (TOPROL-XL) 50 MG 24 hr tablet, Take 1 tablet (50 mg total) by mouth daily. Take with or immediately following a meal., Disp: 30  tablet, Rfl: 0 .  nitroGLYCERIN (NITROSTAT) 0.4 MG SL tablet, Place 1 tablet (0.4 mg total) under the tongue every 5 (five) minutes as needed for chest pain., Disp: 25 tablet, Rfl: 2 .  ondansetron (ZOFRAN) 4 MG tablet, Take 4 mg by mouth every 6 (six) hours as needed for nausea., Disp: , Rfl:  .  oxyCODONE (OXY IR/ROXICODONE) 5 MG immediate release tablet, Take 1-2 tablets (5-10 mg total) by mouth every 6 (six) hours as needed for moderate pain (pain score 4-6)., Disp: 30 tablet, Rfl: 0 .  predniSONE (DELTASONE) 5 MG tablet, Take 5 mg by mouth 2 (two) times daily. , Disp: , Rfl: 1 .  pregabalin (LYRICA) 200 MG capsule, Take 1 capsule (200 mg total) by mouth 2 (two) times daily., Disp: 60 capsule, Rfl: 5 .  SAVAYSA 30 MG TABS tablet, TAKE 1 TABLET (30 MG TOTAL) BY MOUTH DAILY., Disp: 30 tablet, Rfl: 5 .  silodosin (RAPAFLO) 8 MG CAPS capsule, Take 1 capsule (8 mg total) by mouth daily with breakfast., Disp: 30 capsule, Rfl: 3 .  sulfamethoxazole-trimethoprim (BACTRIM DS) 800-160 MG tablet, Take 1 tablet by mouth 2 (two) times daily for 7 days., Disp: 14 tablet, Rfl: 0 .  sulfaSALAzine (AZULFIDINE) 500 MG tablet, Take 500 mg by mouth in the morning and at bedtime. , Disp: , Rfl:  .  tamsulosin (FLOMAX) 0.4 MG CAPS capsule, TAKE 1 CAPSULE (0.4 MG TOTAL) BY MOUTH DAILY AFTER SUPPER., Disp: 30 capsule, Rfl: 1 .  torsemide (DEMADEX) 20 MG tablet, Take 20 mg by mouth daily., Disp: , Rfl:  .  valACYclovir (VALTREX) 500 MG tablet, TAKE 1 TABLET BY MOUTH EVERY DAY (Patient taking differently: Take 500 mg by mouth daily. ), Disp: 30 tablet, Rfl: 4   has a past medical history of Allergy, Anxiety, Carotid artery occlusion, Cataract, Chronic back pain, CLL (chronic lymphoblastic leukemia) (dx 2010), Clotting disorder ([place]), Coronary artery disease (2010), Depression, Diverticulosis, DVT, lower extremity, recurrent ([place]) (2008, 2009), Esophagitis, Fibromyalgia, Gallstones, GERD (gastroesophageal reflux disease),  Gout, Gynecomastia, male, H/O hiatal hernia (2008), Hemorrhoids, Hepatitis A (yrs ago), HIV infection ([place]) (dx 1993), Hypertension, Impotence of organic origin, Myocardial infarction ([place]) (2010), Neuromuscular disorder ([place]), Osteoarthritis, knee, Osteoporosis, PONV (postoperative nausea and vomiting), Pulmonary fibrosis ([place]) (2017), Rheumatoid arthritis(714.0) (dx 2010), Seasonal allergies, Secondary syphilis (07/24/14 dx), Status post dilation of esophageal narrowing, Stroke ([place]) (1969), TIA (transient ischemic attack) (1997), and Tubular adenoma of colon.    has a past surgical history that includes Spine surgery (2010); Cholecystectomy; Hiatal hernia repair; Shoulder surgery (Left); Mandible surgery (Bilateral); varicose vein; Replacement total knee (Bilateral); Knee Arthroplasty (07/22/2011); Hardware Removal (N/A, 07/02/2012); Inguinal hernia repair (Bilateral); Umbilical hernia repair; Rotator cuff repair (Right); Esophagogastroduodenoscopy (egd) with propofol (N/A, 12/28/2012); Colonoscopy with propofol (N/A, 12/28/2012); Joint replacement (Left, 1999); Tonsillectomy; ring around testicle hernia reapir (184 and 1986); Esophagogastroduodenoscopy (egd) with propofol (N/A, 03/15/2013); Video bronchoscopy (Bilateral, 10/16/2015); Coronary artery bypass  graft (2010); stent to heart x 1 (2010); Esophagogastroduodenoscopy (egd) with propofol (N/A, 02/07/2016); Total hip arthroplasty (Left, 08/13/2018); Hip Closed Reduction (Left, 08/15/2018); Anterior hip revision (Left, 08/17/2018); Hip Closed Reduction (Left, 08/28/2018); Hip Closed Reduction (Left, 09/15/2018); Total hip arthroplasty (Left, 09/17/2018); Hip Closed Reduction (Left, 10/31/2018); Anterior hip revision (Left, 11/02/2018); Back surgery (2010 & 2017); Acetabular revision (Left, 11/03/2019); and Anterior hip revision (Left, 11/03/2019).   SIGNATURE    Dr. Brand Males, M.D., F.C.C.P,  Pulmonary and Critical Care Medicine Staff Physician, [place] Director - Interstitial Lung Disease  Program  Pulmonary [place] [facility] [place], [place], [address]  Pager: [phone], If no answer or between  15:00h - 7:00h: call [phone] Telephone: [phone]  10:29 AM 02/16/2020

## 2020-02-16 NOTE — Patient Instructions (Addendum)
ICD-10-CM   1. Interstitial lung disease due to connective tissue disease (HCC)  J84.89 CBC w/Diff   M35.9 Hepatic function panel  2. Immunocompromised state ([place])  D84.9   3. Vaccine counseling  Z71.85 SARS-COV-2 IgG  4. Recent unintentional weight loss over several months  R63.4   5. Anticoagulant long-term use  Z79.01 INR/PT    PTT    You have slowly progressive interstitial lung disease between 2017 and 2021 Within 2021 it seems stable Most likely this is due to rheumatoid arthritis causing progressive pulmonary fibrosis over time Opportunistic infections in the setting of immunosuppressed status is another possibility  Being immunosuppressed you might not have mounted a full antibody response to you to Covid vaccine  Plan -Get CBC, chemistry, liver function, PT, PTT test blood work - Do Quantiferon gold (last 2018 and negative) -Get Covid IgG blood work > if negative then you might need a booster sooner than the later -We will look at scheduling a bronchoscopy with lavage to rule out opportunistic infections  -We will aim for sometime between February 27, 2020 and March 01, 2020 at [facility].  Await further instructions in the next few to several days -Start pirfenidone [we discussed this extensively)  -I will just initially unintentional weight loss go for the lower dose given his significant recent weight loss.   - needs insurance and charity approval    FOLLOWUP  - 6 WEEKS WITH Dr Chase Caller in 30 min slot

## 2020-02-16 NOTE — H&P (View-Only) (Signed)
69 yo never smoker with RA followed by [name]  Has HIV and CLL .    Admit date: 06/18/2015 Discharge date: 06/20/2015  Discharge Diagnoses:  Principal Problem:  Community acquired pneumonia Active Problems:  CAD (coronary artery disease) of artery bypass graft  Type II diabetes mellitus with manifestations (HCC)  HIV disease ([place])  Chronic lymphoblastic leukemia ([place])  Hypertension  DVT, lower extremity, recurrent ([place])  Pneumonia  Dyspnea   06/29/2015  f/u ov/Wert re: NSIP presumably due to RA lung dz/ poor control of arthritis chronically / s/p ? CAP  Chief Complaint  Patient presents with  . Follow-up    PFT done today. Pt hospitalized from 06/18/15-06/20/15 with PNA. He feels his breathing has improved since d/c. Cough is prod wit minimal, thick, dark yellow sputum.    mucous now has turned clear p finished abx /min volume in ams  / activity tol back near baseline s resp meds   No obvious day to day or daytime variability or assoc cp or chest tightness, subjective wheeze or overt sinus or hb symptoms. No unusual exp hx or h/o childhood pna/ asthma or knowledge of premature birth.  Sleeping ok without nocturnal  or early am exacerbation  of respiratory  c/o's or need for noct saba. Also denies any obvious fluctuation of symptoms with weather or environmental changes or other aggravating or alleviating factors except as outlined above     OV 09/13/2015  Chief Complaint  Patient presents with  . Follow-up    Pt changing providers from MW to MR for pulmonary fibrosis. Pt c/o DOE. Pt denies cough, CP/tightness.   Is a transfer of care from Dr. Legrand Como wert to Dr. Fuller Plan  69 year old male immunosuppressed on basis of HIV and rheumatoid arthritis. Given necrosis factor Alpha blockade and Imuran. He appears to have CT findings of interstitial lung disease starting in September 2016 and progressing all the way through end of February 2017. A 2015 CT chest  did not show any interstitial lung disease  He reports that December 2016 he reported to pulmonary office for worsening shortness of breath. Noticed to have interstitial lung disease changes. Clinical diagnosis that interstitial lung disease was related to rheumatoid arthritis was made [his HIV viral load was low and CD4 counts were normal all the way since 2010]. This point in time he had been on infliximab for 3 years. A decision was made to add Imuran to the regimen. At this point in time he was not on Bactrim. Subsequently he got sick early every 2017 and was admitted to the hospital with a clinical diagnosis of community acquired pneumonia and treated with antibiotics and discharged. Subsequently readmitted end of February 2017 for 10 days with hypoxemic respiratory failure. Treated with broad-spectrum antibiotics but did not improve. Finally started on Bactrim for presumed Pneumocystis carinii pneumonia according to the patient and then began to improve. This was when he had last CT scan of the chest which comparatively shows progressive ILD changes.  At this point in time he is on immunosuppressive medications. Most recently saw Dr. Riley Churches and infectious diseases his CD4 count has dropped for the first time and he believes this is due to stress. He is compliant with this anti-retroviral therapy Genovaya. He is also on anticoagulation. Overall he is feeling much better physically stronger and less short of breath. Although he still does have shortness of breath with exertion infection in the office he did desaturate.  Walking desat test 185 feet x 3 laps on RA -> desat to 87% at 2nd lap and got dyspneic and wanted to stop  10/25/2015 Follow up: ILD  Patient returns for a 6 week follow-up for ILD. Patient was recently seen by Dr. Chase Caller for worsening shortness of breath since December 2016. He was noticed to have ILD changes on CT chest. Diagnosis with interstitial lung disease related to  rheumatoid arthritis was made. He does have HIV with CD4 counts normal all the way back since 2010. He was started on Imuran at that time. Patient was admitted earlier this year with pneumonia and readmitted in February 2017 for acute hypoxic respiratory failure. He chest in February 2017 showed progressive patchy opacities in both lungs. Repeat CT chest 09/27/2015 showed a significant decrease in groundglass attenuation bilaterally. Fibrotic changes are considered stable since February 2017 but increased since January 2015 Patient returns today after undergoing a bronchoscopy on 10/16/2015. Ultracet been negative to date. Cytology showed benign reactive changes. Patient says overall he is feeling okay. He continues to use oxygen with activity and at bedtime. He denies any flare cough or dyspnea.  Has been having more reflux . Has intermittent dysphagia .  Chokes easily . Previous hx of esophageal surgery .    OV 01/24/2016  Chief Complaint  Patient presents with  . Follow-up    3 month ILD follow up & PFT review - reports breathing is at baseline.  was in [facility] July 2017 for PNA     follow-up interstitial lung disease in te setting of rheumatoid arthriti on Imuran and HIV disease with  Normal CD4 count nd antiretroviratherapy.   he is on HARRT and TNF alpha blockade treatment aocumented below. Overall snce last isit he is doing wll. Dyspnea is improved. In the interim approximately June 2017ot admitted at [facility] of "fluid in lungs" .  Now back tbaeline  PFT is normal but for low dlco that is moderate 65%. This is improved from early 2017 and similar to year ago. CT chest today just mild basal ILD - NSIP pattern - personally visuzalied    OV 10/13/2019 -patient last seen by me in 2017 September.  Is been over 3 years there for this new visit.  Subjective:  Patient ID: [name], male , DOB: [birth year] , age 69 y.o. , MRN: [id number] , ADDRESS: [id number] [id number]   10/13/2019 -   Chief Complaint  Patient presents with  . Consult    Doe for years   Immunosuppressed on basis of HIV and rheumatoid arthritis. Given necrosis factor Alpha blockade and Imuran. He appears to have CT findings of interstitial lung disease starting in September 2016 and progressing all the way through end of February 2017. A 2015 CT chest did not show any interstitial lung disease  June 2017 BAL: Eosinophils 10% and lymphocytes 55%.   Normal CD4 count nd antiretroviratherapy.  HPI [name] 69 y.o. -personally seen over 3 years ago.  After that not seen and not followed up.  He says in the interim his HIV continues to be under control with good CD4 counts.  He is compliant with his antiretroviral therapy.  He is no longer on oxygen though for his interstitial lung disease.  His 2017 BAL showed lymphocytes and eosinophils.  Negative for cultures.  He says slow and steadily his dyspnea is gotten worse.  However when we walked in he is only able to walk  half a lap and stop because of hip pain and dyspnea but did not desaturate but got very tachycardic.  He says that his left hip has bothered him probably from a combination of osteoarthritis and rheumatoid arthritis.  He has had hip replacement but now the plastic socket is loose and needs to be repaired.  He is here for preoperative clearance.  He says he is in significant amount of pain.  He wants a surgery done soon.  His recent renal function and nutritional status is adequate.  His hemoglobin recently is also adequate no recent respiratory infections.  His pulse ox on room air at rest is fine.  His last CT scan of the chest and pulmonary function test was all several years ago and is documented below.  He is agreeable to have this restaged.  I explained to him the new antifibrotic indications and non-IPF progressive ILD.  However he wants to have his hip surgery first and wants a clearance.    Simple office  walk 185 feet x  3 laps goal with forehead probe 10/13/2019   O2 used ra  Number laps completed 3 attempted by did only 1 and stopped due to hip pain  Comments about pace slow  Resting Pulse Ox/HR 95% and 84/min  Final Pulse Ox/HR 95% and 112/min  Desaturated </= 88% no  Desaturated <= 3% points no  Got Tachycardic >/= 90/min yes  Symptoms at end of test dspnea nad hip pain  Miscellaneous comments x       Results for Truax, Oval L "PHIL" (MRN [id number]) as of 10/13/2019 10:00  Ref. Range 01/24/2016 08:53  FVC-Pre Latest Units: L 3.46  FVC-%Pred-Pre Latest Units: % 93  FEV1-Pre Latest Units: L 2.68  FEV1-%Pred-Pre Latest Units: % 98  Pre FEV1/FVC ratio Latest Units: % 78  Results for Mand, Zayvion L "PHIL" (MRN [id number]) as of 10/13/2019 10:00  Ref. Range 01/24/2016 08:53  TLC Latest Units: L 5.70  TLC % pred Latest Units: % 94   Results for Wambolt, Akhilesh L "PHIL" (MRN [id number]) as of 10/13/2019 10:00  Ref. Range 01/24/2016 08:53  FEV1-%Pred-Post Latest Units: % 106    He had a chest x-ray August 01, 2019 that I personally visualized.  Compared to the one in 2020 it appears that he has new onset of streaky atelectasis although he could have had the same thing in 2020 and is just more prominent now.   ROS - Results for Burch, Jlyn L "PHIL" (MRN [id number]) as of 10/13/2019 10:00  Ref. Range 02/18/2016 15:37 05/13/2016 14:05 05/15/2016 08:31 05/16/2016 08:13 05/17/2016 07:01 05/18/2016 04:02 05/19/2016 08:17 05/20/2016 08:36 05/21/2016 05:31 05/23/2016 05:47 06/25/2016 09:39 07/04/2016 07:49 07/09/2016 00:00 07/22/2016 00:00 08/13/2016 09:18 12/25/2016 10:56 02/12/2017 09:47 07/09/2017 09:13 08/13/2017 09:16 11/19/2017 11:42 01/07/2018 08:52 05/13/2018 08:37 08/11/2018 08:48 08/14/2018 01:45 08/16/2018 04:11 08/18/2018 09:33 08/28/2018 14:12 08/28/2018 14:12 09/15/2018 01:02 09/16/2018 08:36 09/18/2018 04:17 09/19/2018 04:40 09/20/2018 04:40 09/26/2018 16:25 10/28/2018 11:04 11/03/2018 05:21 11/16/2018 15:26 11/19/2018 12:50 12/16/2018 09:02  03/24/2019 13:03 06/06/2019 11:56 08/04/2019 09:50 08/16/2019 09:46 09/15/2019 10:15  Hemoglobin Latest Ref Range: 13.0 - 17.0 g/dL 12.7 (L) 12.2 (L) 11.1 (L) 11.4 (L) 11.9 (L) 11.0 (L) 12.5 (L) 12.4 (L) 12.0 (L) 11.5 (L) 13.9 9.6 (L) 9.2 (A) 10.3 (A) 10.6 (L) 12.5 (L) 12.4 (L) 13.6 14.1 13.7 14.3 13.6 13.9 10.0 (L) 10.1 (L) 10.0 (L) 9.2 (L)  10.7 (L) 11.3 (L) 7.2 (L) 8.1 (L) 8.4 (L) 10.1 (L) 11.2 (L) 7.3 (L) 9.5 (L)  10.4 (L) 11.9 (L) 12.8 (L) 12.9 (L) 12.9 (L) 12.3 (L) 13.0   Results for Whan, Mikolaj L "PHIL" (MRN [id number]) as of 10/13/2019 10:00  Ref. Range 05/15/2016 02:48 05/15/2016 08:31 05/16/2016 08:13 05/17/2016 07:01 05/18/2016 04:02 05/19/2016 08:17 05/20/2016 08:36 05/21/2016 05:31 05/23/2016 05:47 06/25/2016 09:39 07/04/2016 07:49 07/09/2016 00:00 08/13/2016 09:19 08/13/2016 09:19 08/14/2016 16:16 12/25/2016 10:56 07/09/2017 09:13 11/19/2017 11:42 11/19/2017 11:48 01/07/2018 08:52 01/21/2018 10:52 05/13/2018 08:37 07/01/2018 10:19 08/11/2018 08:48 08/14/2018 01:45 08/28/2018 14:12 09/15/2018 01:02 09/18/2018 04:17 09/26/2018 16:25 09/26/2018 17:48 10/28/2018 11:04 11/03/2018 05:21 11/16/2018 15:26 11/19/2018 12:50 11/19/2018 13:55 12/02/2018 09:09 12/08/2018 11:07 12/16/2018 09:02 01/31/2019 10:46 03/24/2019 13:03 06/06/2019 11:56 08/04/2019 09:50 08/16/2019 09:46 09/15/2019 10:15  Creatinine Latest Ref Range: 0.61 - 1.24 mg/dL  0.96 0.82 0.70 0.81 0.86 0.80 0.82 0.92 1.05 0.88 0.9 1.1   0.96 1.12 1.20  1.12  0.84  1.05 0.88 0.95 0.68 0.63 0.75  0.83 0.80  0.77  0.88 0.93 0.80 0.83  0.85 1.00 0.90 1.11     Echocardiogram November 30, 2018 -Normal ejection fraction with some amount of diastolic dysfunction mild aortic stenosis.  [In 2016 he had cardiac stress test that showed ejection fraction of 50% with old scar]  Results for Palmateer, Alson L "PHIL" (MRN [id number]) as of 10/13/2019 10:00  Ref. Range 10/16/2015 08:01  Monocyte-Macrophage-Serous Fluid Latest Ref Range: 50 - 90 % 23 (L)  Other Cells, Fluid Latest Units: % MANY  Fluid Type-FCT Unknown BRONCHIAL  ALVEOLAR LAVAGE  Color, Fluid Latest Ref Range: YELLOW  COLORLESS (A)  Total Nucleated Cell Count, Fluid Latest Ref Range: 0 - 1,000 cu mm 115  Lymphs, Fluid Latest Units: % 55  Eos, Fluid Latest Units: % 10  Appearance, Fluid Latest Ref Range: CLEAR  HAZY (A)  Neutrophil Count, Fluid Latest Ref Range: 0 - 25 % 12   Ct Chest High Resolution  Result Date: 01/24/2016 CLINICAL DATA:  69 year old male with shortness of breath on exertion. History of pneumonia in May 2017. History of rheumatoid arthritis. Evaluate for interstitial lung disease. EXAM: CT CHEST WITHOUT CONTRAST TECHNIQUE: Multidetector CT imaging of the chest was performed following the standard protocol without intravenous contrast. High resolution imaging of the lungs, as well as inspiratory and expiratory imaging, was performed. COMPARISON:  Multiple priors, most recently 09/27/2015. FINDINGS: Cardiovascular: Heart size is mildly enlarged. There is no significant pericardial fluid, thickening or pericardial calcification. There is aortic atherosclerosis, as well as atherosclerosis of the great vessels of the mediastinum and the coronary arteries, including calcified atherosclerotic plaque in the left main, left anterior descending, left circumflex and right coronary arteries. Status post median sternotomy for CABG, including LIMA to the LAD. Curvilinear hypoattenuation in the myocardium of the mid to distal LAD territory, suggestive of fibrofatty metaplasia related to prior LAD territory myocardial infarction. Mediastinum/Nodes: There are multiple prominent but nonenlarged mediastinal and bilateral hilar lymph nodes, many of which are densely calcified (nonspecific). Small hiatal hernia. Surgical clips near the gastroesophageal junction. No axillary lymphadenopathy. Lungs/Pleura: High-resolution again demonstrates some patchy areas of peripheral predominant ground-glass attenuation. This has no definitive craniocaudal gradient. Some patchy  areas of associated septal thickening and subpleural reticulation with scattered areas of very mild cylindrical bronchiectasis are also noted, essentially unchanged compared to the prior study. No honeycombing. Inspiratory and expiratory imaging demonstrates some moderate air trapping, indicative of small airways disease. Scarring in the inferior segment of the lingula is unchanged. No acute consolidative airspace disease. No pleural effusions. No definite suspicious appearing pulmonary nodules  or masses are noted. Scattered calcified granulomas. Upper Abdomen: Calcified granuloma [place] in the liver and spleen. Status post cholecystectomy. Aortic atherosclerosis. Musculoskeletal: Median sternotomy wires. IMPRESSION: 1. The appearance of the chest appears essentially unchanged compared to the prior study from 09/27/2015, again most suggestive of underlying nonspecific interstitial pneumonia (NSIP) as a manifestation of rheumatoid lung disease. No definitive findings to suggest UIP at this time. 2. Aortic atherosclerosis, in addition to left main and 3 vessel coronary artery disease. Status post median sternotomy for CABG. 3. Old granulomatous disease, as above. Electronically Signed   By: Vinnie Langton M.D.   On: 01/24/2016 10:16   12/22/2019 Follow up : ILD  Patient presents for a 1-monthfollow-up.  Patient was seen last visit to reestablish for underlying interstitial lung disease.  Patient has multiple comorbidities including rheumatoid arthritis on chronic immunosuppression.  Also HIV and CLL.  Patient was set up for pulmonary function test shows no airflow obstruction or restriction.  Slight drop in FVC since 2017.  FEV1 86%, ratio 77, FVC 81%.  DLCO is stable at 65% similar to 2017.  Patient was set up for high-resolution CT chest unfortunately this is has not been completed today. ONO was positive for desaturations while sleeping . Started on oxygen 2l/m At bedtime  Doing well with this. Sleeps better with  oxygen.  Not active , limited by joint issues and shortness of breath . Has power scooter. No cough .   Hip surgery May 2021 , doing some better still painful. Following with ortho. Done with IV antibiotics.  IMPRESSION: 1. Basilar predominant pattern of subpleural fibrosis appears more organized and progressive from prior exams and may be due to usual interstitial pneumonitis or fibrotic nonspecific interstitial pneumonitis. Findings are indeterminate for UIP per consensus guidelines: Diagnosis of Idiopathic Pulmonary Fibrosis: An Official ATS/ERS/JRS/ALAT Clinical Practice Guideline. [place] Iss 5, [phone] Jan 10 2017. 2. Small left fibrothorax with adjacent scarring and volume loss in the left lower lobe. 3.  Aortic atherosclerosis (ICD10-I70.0).   Electronically Signed   By: MLorin PicketM.D.   On: 10/19/2019 12:34  OV 02/16/2020  Subjective:  Patient ID: PMaryagnes Amos male , DOB: [birth year], age 69y.o. , MRN: [id number], ADDRESS: [address] [place] [id number] JJanith Lima MD Relevant Other Providers: - Dr HPercival Spanish cards; Dr. CJean Rosenthalin orthopedics, Dr. PNicolette Bangneurology, Dr. JBillie Ladein [place] Dr. JRiley Churchesin infectious diseases, Dr. CAsencion PartridgeDohmeier in neurology for drug-induced polyneuropathy, Dr. FZola Buttonand oncology for CLL This Provider for this visit: Treatment Team:  Attending Provider: RBrand Males MD    02/16/2020 -   Chief Complaint  Patient presents with  . Follow-up    Pt had CT performed and is here to discuss results.  Pt states he has been doing okay since last visit. Denies any complaints of SOB, cough, or wheezing.   Immunosuppressed on basis of HIV and rheumatoid arthritis. Given necrosis factor Alpha blockade and Imuran.   #ILD - No ILD OnCT 2015. Onset September 2016 and progressing all the way through end of February 2017. June 2017 BAL: Eosinophils  10% and lymphocytes 55%. Progressive ILD since 2017 as of June 2021 /Oct 2021 (Indeterminate/Prob UIP respectively)   -Uses nighttime oxygen since summer 2021 #HIV  -  Normal CD4 count nd antiretroviratherapy. 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 -presents for follow-up.  He last saw me in June 2021 and after that saw nurse practitioner.  He had 2 CT scans of the chest since then the first in June 2021 and the second 1 in October 2021.  I personally visualized these.  I agree with the findings of indeterminate/probable UIP respectively.  This year his ILD stable on the CT scan but compared to 2017 there is definite progression.  He himself feels overall he is stable.  His issues are mainly related to weight loss.  He says is unintentional he is lost 20 pounds this year.  This because of mobility issues.  He has had hip surgery he is using a walker he has very limited mobility.  In fact we try to walk him 185 height x3 laps but he only walked 1 lap and stop because of pain.  He is pretty cachectic.  He is on multiple medications because of his immunosuppression from HIV.  He is also immunosuppressed by taking immunomodulators for rheumatoid arthritis.  He has had 2 shots of the Covid vaccine but thinks he has to wait 6 months before getting his booster.  He is immunosuppressed.  SYMPTOM SCALE - ILD 02/16/2020 Last Weight  Most recent update: 02/16/2020  9:08 AM   Weight  73.8 kg (162 lb 12.8 oz)             O2 use  ra  Shortness of Breath 0 -> 5 scale with 5 being worst (score 6 If unable to do)  At rest 1  Simple tasks - showers, clothes change, eating, shaving 2  Household (dishes, doing bed, laundry) 3  Shopping 3  Walking level at own pace 5  Walking up Stairs 5  Total (30-36) Dyspnea Score 19  How bad is your cough? 0  How bad is your fatigue 0  How bad is nausea 0  How bad is vomiting?  0  How bad is diarrhea? 3  How bad is anxiety? 0  How bad is depression 0        Simple office walk 185 feet x  3 laps goal with forehead probe 10/13/2019  02/16/2020   O2 used ra ra  Number laps completed 3 attempted by did only 1 and stopped due to hip pain 3 but did only 1 due to leg and back pain  Comments about pace slow avg pace, used walker  Resting Pulse Ox/HR 95% and 84/min 98% and 75/min  Final Pulse Ox/HR 95% and 112/min 96% and 94/min  Desaturated </= 88% no no  Desaturated <= 3% points no no  Got Tachycardic >/= 90/min yes yes  Symptoms at end of test dspnea nad hip pain Moderate dyspnea  Miscellaneous comments x     PFT Results Latest Ref Rng & Units 12/12/2019 01/24/2016 06/29/2015 08/31/2014  FVC-Pre L 2.93 3.46 3.26 3.98  FVC-Predicted Pre % 81 93 87 106  FVC-Post L - 3.52 3.16 4.11  FVC-Predicted Post % - 95 84 109  Pre FEV1/FVC % % 77  78 77 74  Post FEV1/FCV % % - 83 80 78  FEV1-Pre L 2.25 2.68 2.50 2.96  FEV1-Predicted Pre % 86 98 90 106  FEV1-Post L - 2.91 2.51 3.21  DLCO uncorrected ml/min/mmHg 14.36 16.88 14.89 16.15  DLCO UNC% % 65 65 58 63  DLCO corrected ml/min/mmHg 16.75 - 14.69 -  DLCO COR %Predicted % 76 - 57 -  DLVA Predicted % 81 77 81 94  TLC L - 5.70 5.86 5.54  TLC % Predicted % - 94 97 92  RV % Predicted % - 103 106 97   CT Chest High Resolution  Result Date: 02/13/2020 CLINICAL DATA:  Follow-up interstitial lung disease. History of CLL. EXAM: CT CHEST WITHOUT CONTRAST TECHNIQUE: Multidetector CT imaging of the chest was performed following the standard  protocol without intravenous contrast. High resolution imaging of the lungs, as well as inspiratory and expiratory imaging, was performed. COMPARISON:  10/18/2019 high-resolution chest CT. FINDINGS: Cardiovascular: Borderline mild cardiomegaly. Subendomyocardial fat throughout the left ventricular wall is unchanged and compatible with remote myocardial infarction. Three-vessel coronary atherosclerosis status post CABG. No significant pericardial effusion/thickening. Atherosclerotic nonaneurysmal thoracic aorta. Normal caliber pulmonary arteries. Mediastinum/Nodes: No discrete thyroid nodules. Unremarkable esophagus. No axillary adenopathy. Coarsely calcified nonenlarged paratracheal, subcarinal and right hilar nodes, compatible with prior granulomatous disease, unchanged. No pathologically enlarged noncalcified mediastinal or discrete hilar nodes on these noncontrast images. Lungs/Pleura: No pneumothorax. Loculated small basilar left pleural effusion with minimal smooth left pleural thickening and parenchymal banding at the left lung base, unchanged. No right pleural effusion. Subcentimeter calcified bilateral upper lobe granulomas are unchanged. No acute consolidative airspace disease or lung masses. No significant pulmonary nodules. No significant lobular air trapping or evidence of tracheobronchomalacia on the expiration sequence. There is mild-to-moderate patchy subpleural reticulation and ground-glass opacity in both lungs with associated mild traction bronchiectasis and architectural distortion. There is a basilar predominance to these findings. No frank honeycombing. No compelling interval progression since recent 10/18/2019 chest CT. Findings have progressed mildly since 01/24/2016 chest CT as previously described. Upper abdomen: Small to moderate hiatal hernia. Granulomatous calcifications scattered throughout the liver and spleen. Cholecystectomy. Musculoskeletal: No aggressive appearing focal osseous  lesions. Intact sternotomy wires. Soft tissue anchors in the anterior right humeral head. Partially visualized bilateral posterior spinal fusion hardware extending inferiorly from T11 level. Marked lower thoracic spondylosis. IMPRESSION: 1. Spectrum of findings compatible with basilar predominant fibrotic interstitial lung disease without frank honeycombing and without compelling interval progression since recent 10/18/2019 chest CT. Findings have progressed mildly since 01/24/2016 chest CT, as previously described. Differential includes UIP or fibrotic NSIP. Findings are categorized as probable UIP per consensus guidelines: Diagnosis of Idiopathic Pulmonary Fibrosis: An Official ATS/ERS/JRS/ALAT Clinical Practice Guideline. [place], Iss 5, [phone], Jan 10 2017. 2. Borderline mild cardiomegaly. Evidence of remote myocardial infarction in the left ventricle. 3. Chronic loculated small basilar left pleural effusion with associated pleuroparenchymal scarring. 4. Small to moderate hiatal hernia. 5. Aortic Atherosclerosis (ICD10-I70.0). Electronically Signed   By: Ilona Sorrel M.D.   On: 02/13/2020 09:57     ROS - per HPI     has a past medical history of Allergy, Anxiety, Carotid artery occlusion, Cataract, Chronic back pain, CLL (chronic lymphoblastic leukemia) (dx 2010), Clotting disorder ([place]), Coronary artery disease (2010), Depression, Diverticulosis, DVT, lower extremity, recurrent ([place]) (2008, 2009), Esophagitis, Fibromyalgia, Gallstones, GERD (gastroesophageal reflux disease), Gout, Gynecomastia, male, H/O hiatal hernia (2008), Hemorrhoids, Hepatitis A (yrs ago), HIV infection ([place]) (  dx 1993), Hypertension, Impotence of organic origin, Myocardial infarction ([place]) (2010), Neuromuscular disorder ([place]), Osteoarthritis, knee, Osteoporosis, PONV (postoperative nausea and vomiting), Pulmonary fibrosis ([place]) (2017), Rheumatoid arthritis(714.0) (dx 2010), Seasonal allergies, Secondary  syphilis (07/24/14 dx), Status post dilation of esophageal narrowing, Stroke ([place]) (1969), TIA (transient ischemic attack) (1997), and Tubular adenoma of colon.   reports that he has never smoked. He has never used smokeless tobacco.  Past Surgical History:  Procedure Laterality Date  . ACETABULAR REVISION Left 11/03/2019   REVISION LEFT HIP ACETABULAR COMPONENT (Left Hip)  . ANTERIOR HIP REVISION Left 08/17/2018   Procedure: ANTERIOR LEFT HIP REVISION;  Surgeon: Mcarthur Rossetti, MD;  Location: [place];  Service: Orthopedics;  Laterality: Left;  . ANTERIOR HIP REVISION Left 11/02/2018   Procedure: LEFT HIP CONSTRAINED LINER REVISION;  Surgeon: Mcarthur Rossetti, MD;  Location: [place];  Service: Orthopedics;  Laterality: Left;  . ANTERIOR HIP REVISION Left 11/03/2019   Procedure: REVISION LEFT HIP ACETABULAR COMPONENT;  Surgeon: Mcarthur Rossetti, MD;  Location: [place];  Service: Orthopedics;  Laterality: Left;  . BACK SURGERY  2010 & 2017  . CHOLECYSTECTOMY    . COLONOSCOPY WITH PROPOFOL N/A 12/28/2012   Procedure: COLONOSCOPY WITH PROPOFOL;  Surgeon: Jerene Bears, MD;  [facility];  Service: Gastroenterology;  Laterality: N/A;  . CORONARY ARTERY BYPASS GRAFT  2010   triple bypass  . ESOPHAGOGASTRODUODENOSCOPY (EGD) WITH PROPOFOL N/A 12/28/2012   Procedure: ESOPHAGOGASTRODUODENOSCOPY (EGD) WITH PROPOFOL;  Surgeon: Jerene Bears, MD;  [facility];  Service: Gastroenterology;  Laterality: N/A;  . ESOPHAGOGASTRODUODENOSCOPY (EGD) WITH PROPOFOL N/A 03/15/2013   Procedure: ESOPHAGOGASTRODUODENOSCOPY (EGD) WITH PROPOFOL;  Surgeon: Jerene Bears, MD;  [facility];  Service: Gastroenterology;  Laterality: N/A;  . ESOPHAGOGASTRODUODENOSCOPY (EGD) WITH PROPOFOL N/A 02/07/2016   Procedure: ESOPHAGOGASTRODUODENOSCOPY (EGD) WITH PROPOFOL;  Surgeon: Jerene Bears, MD;  [facility];  Service: Gastroenterology;  Laterality: N/A;  . HARDWARE REMOVAL N/A  07/02/2012   Procedure: HARDWARE REMOVAL;  Surgeon: Elaina Hoops, MD;  Location: [facility] [facility];  Service: Neurosurgery;  Laterality: N/A;  . HIATAL HERNIA REPAIR     wrap  . HIP CLOSED REDUCTION Left 08/15/2018   Procedure: CLOSED REDUCTION HIP;  Surgeon: Newt Minion, MD;  Location: [place];  Service: Orthopedics;  Laterality: Left;  . HIP CLOSED REDUCTION Left 08/28/2018   Procedure: CLOSED REDUCTION HIP FOR RECURRENT DISLOCATION AND DRESSING CHANGE;  Surgeon: Jessy Oto, MD;  Location: [place];  Service: Orthopedics;  Laterality: Left;  . HIP CLOSED REDUCTION Left 09/15/2018   Procedure: CLOSED REDUCTION HIP DISLOCATION;  Surgeon: Mcarthur Rossetti, MD;  Location: [place];  Service: Orthopedics;  Laterality: Left;  . HIP CLOSED REDUCTION Left 10/31/2018   Procedure: CLOSED REDUCTION LEFT TOTAL HIP;  Surgeon: Leandrew Koyanagi, MD;  Location: [place];  Service: Orthopedics;  Laterality: Left;  . INGUINAL HERNIA REPAIR Bilateral   . JOINT REPLACEMENT Left 1999  . KNEE ARTHROPLASTY  07/22/2011   Procedure: COMPUTER ASSISTED TOTAL KNEE ARTHROPLASTY;  Surgeon: Meredith Pel, MD;  Location: [place];  Service: Orthopedics;  Laterality: Right;  Right total knee arthroplasty  . MANDIBLE SURGERY Bilateral    tmj  . REPLACEMENT TOTAL KNEE Bilateral   . ring around testicle hernia reapir  184 and 1986   x 2  . ROTATOR CUFF REPAIR Right   . SHOULDER SURGERY Left   . SPINE SURGERY  2010   "rod and screws", "failed",  lopwer spine,   . stent to heart x 1  2010  . TONSILLECTOMY    . TOTAL HIP ARTHROPLASTY Left 08/13/2018   Procedure: LEFT TOTAL HIP ARTHROPLASTY ANTERIOR APPROACH;  Surgeon: Mcarthur Rossetti, MD;  Location: [place];  Service: Orthopedics;  Laterality: Left;  . TOTAL HIP ARTHROPLASTY Left 09/17/2018   Procedure: ANTERIOR LEFT HIP REVISION;  Surgeon: Mcarthur Rossetti, MD;  Location: [place];  Service: Orthopedics;  Laterality: Left;  . UMBILICAL HERNIA REPAIR     x 1  . varicose vein      stripping  . VIDEO BRONCHOSCOPY Bilateral 10/16/2015   Procedure: VIDEO BRONCHOSCOPY WITHOUT FLUORO;  Surgeon: Brand Males, MD;  [facility];  Service: Cardiopulmonary;  Laterality: Bilateral;    Allergies  Allergen Reactions  . Golimumab Anaphylaxis    Simponi ARIA  . Orencia [Abatacept] Anaphylaxis  . Other Anaphylaxis and Hives    Pecans  . Peanut-Containing Drug Products Anaphylaxis, Hives and Swelling    Swelling of throat  . Morphine Other (See Comments)    Severe headache- Can tolerate Dilaudid, however  . Oxycodone-Acetaminophen Other (See Comments)    Headache 6.22.2020 patient is currently taking and tolerating  . Promethazine Hcl Other (See Comments)    Makes him feel "drunk" at higher strengths    Immunization History  Administered Date(s) Administered  . Fluad Quad(high Dose 65+) 12/30/2018, 02/15/2020  . H1N1 04/28/2008  . Hepatitis A, Adult 09/07/2007, 03/10/2008  . Hepatitis B, adult 09/08/2013, 10/27/2013, 03/02/2014  . Influenza Split 01/17/2011, 02/11/2012, 02/18/2013, 02/10/2015  . Influenza, High Dose Seasonal PF 02/02/2014, 01/25/2016, 01/14/2018  . Influenza-Unspecified 02/13/2017  . PFIZER SARS-COV-2 Vaccination 06/23/2019, 07/19/2019  . PPD Test 02/22/2007, 06/12/2008  . Pneumococcal Conjugate-13 08/10/2014  . Pneumococcal Polysaccharide-23 05/13/2003, 12/08/2007, 01/16/2009, 10/27/2013  . Td 11/19/2011  . Zoster 03/02/2014    Family History  Problem Relation Age of Onset  . Breast cancer Mother   . Hypertension Mother   . Hyperlipidemia Mother   . Diabetes Mother   . Prostate cancer Father   . Colon polyps Father   . Hyperlipidemia Father   . Crohn's disease Paternal Aunt   . Diabetes Maternal Grandmother   . Diabetes Brother        x 3  . Heart attack Brother   . Heart disease Brother        x 3  . Heart attack Brother   . Hyperlipidemia Brother        x 3  . Alcohol abuse Daughter   . Drug abuse Daughter   .  Asthma Brother   . CVA Brother   . Colon cancer Neg Hx   . Esophageal cancer Neg Hx   . Rectal cancer Neg Hx   . Stomach cancer Neg Hx      Current Outpatient Medications:  .  acetaminophen (TYLENOL) 500 MG tablet, Take 500-1,000 mg by mouth every 6 (six) hours as needed for mild pain or moderate pain. , Disp: , Rfl:  .  alfuzosin (UROXATRAL) 10 MG 24 hr tablet, Take 1 tablet (10 mg total) by mouth daily with breakfast., Disp: 30 tablet, Rfl: 11 .  amoxicillin (AMOXIL) 875 MG tablet, Take 1 tablet (875 mg total) by mouth 2 (two) times daily., Disp: 60 tablet, Rfl: 2 .  atorvastatin (LIPITOR) 10 MG tablet, Take 1 tablet (10 mg total) by mouth at bedtime., Disp: 30 tablet, Rfl: 9 .  bictegravir-emtricitabine-tenofovir AF (BIKTARVY) 50-200-25 MG TABS tablet, Take 1 tablet  by mouth daily. , Disp: , Rfl:  .  darunavir-cobicistat (PREZCOBIX) 800-150 MG tablet, Take 1 tablet by mouth daily with breakfast. , Disp: , Rfl:  .  docusate sodium (COLACE) 100 MG capsule, Take 1 capsule (100 mg total) by mouth 2 (two) times daily., Disp: 10 capsule, Rfl: 0 .  EPINEPHrine (EPIPEN 2-PAK) 0.3 mg/0.3 mL IJ SOAJ injection, Inject 0.3 mg into the muscle as needed for anaphylaxis., Disp: , Rfl:  .  escitalopram (LEXAPRO) 20 MG tablet, Take 1 tablet (20 mg total) by mouth daily., Disp: 90 tablet, Rfl: 1 .  ezetimibe (ZETIA) 10 MG tablet, Take 1 tablet (10 mg total) by mouth daily. (Patient taking differently: Take 10 mg by mouth at bedtime. ), Disp: 90 tablet, Rfl: 3 .  folic acid (FOLVITE) 1 MG tablet, Take 1 mg by mouth daily., Disp: , Rfl: 11 .  KLOR-CON M20 20 MEQ tablet, TAKE 1 TABLET (20 MEQ TOTAL) BY MOUTH 3 (THREE) TIMES DAILY., Disp: 270 tablet, Rfl: 0 .  l-methylfolate-B6-B12 (METANX) 3-35-2 MG TABS tablet, TAKE 1 TABLET BY MOUTH 2 (TWO) TIMES DAILY., Disp: 180 tablet, Rfl: 1 .  leflunomide (ARAVA) 20 MG tablet, Take 20 mg by mouth daily. , Disp: , Rfl:  .  metoprolol succinate (TOPROL-XL) 50 MG 24 hr  tablet, Take 1 tablet (50 mg total) by mouth daily. Take with or immediately following a meal., Disp: 30 tablet, Rfl: 0 .  nitroGLYCERIN (NITROSTAT) 0.4 MG SL tablet, Place 1 tablet (0.4 mg total) under the tongue every 5 (five) minutes as needed for chest pain., Disp: 25 tablet, Rfl: 2 .  ondansetron (ZOFRAN) 4 MG tablet, Take 4 mg by mouth every 6 (six) hours as needed for nausea., Disp: , Rfl:  .  oxyCODONE (OXY IR/ROXICODONE) 5 MG immediate release tablet, Take 1-2 tablets (5-10 mg total) by mouth every 6 (six) hours as needed for moderate pain (pain score 4-6)., Disp: 30 tablet, Rfl: 0 .  pregabalin (LYRICA) 200 MG capsule, Take 1 capsule (200 mg total) by mouth 2 (two) times daily., Disp: 60 capsule, Rfl: 5 .  SAVAYSA 30 MG TABS tablet, TAKE 1 TABLET (30 MG TOTAL) BY MOUTH DAILY., Disp: 30 tablet, Rfl: 5 .  silodosin (RAPAFLO) 8 MG CAPS capsule, Take 1 capsule (8 mg total) by mouth daily with breakfast., Disp: 30 capsule, Rfl: 3 .  sulfamethoxazole-trimethoprim (BACTRIM DS) 800-160 MG tablet, Take 1 tablet by mouth 2 (two) times daily for 7 days., Disp: 14 tablet, Rfl: 0 .  sulfaSALAzine (AZULFIDINE) 500 MG tablet, Take 500 mg by mouth in the morning and at bedtime. , Disp: , Rfl:  .  tamsulosin (FLOMAX) 0.4 MG CAPS capsule, TAKE 1 CAPSULE (0.4 MG TOTAL) BY MOUTH DAILY AFTER SUPPER., Disp: 30 capsule, Rfl: 1 .  torsemide (DEMADEX) 20 MG tablet, Take 20 mg by mouth daily., Disp: , Rfl:  .  valACYclovir (VALTREX) 500 MG tablet, TAKE 1 TABLET BY MOUTH EVERY DAY (Patient taking differently: Take 500 mg by mouth daily. ), Disp: 30 tablet, Rfl: 4 .  ezetimibe (ZETIA) 10 MG tablet, Take 1 tablet (10 mg total) by mouth daily. (Patient taking differently: Take 10 mg by mouth at bedtime. ), Disp: 90 tablet, Rfl: 3 .  leflunomide (ARAVA) 20 MG tablet, Take 20 mg by mouth daily. , Disp: , Rfl:  .  nitroGLYCERIN (NITROSTAT) 0.4 MG SL tablet, Place 1 tablet (0.4 mg total) under the tongue every 5 (five)  minutes as needed for chest pain., Disp:  25 tablet, Rfl: 2 .  predniSONE (DELTASONE) 5 MG tablet, Take 5 mg by mouth 2 (two) times daily. , Disp: , Rfl: 1 .  sulfaSALAzine (AZULFIDINE) 500 MG tablet, Take 500 mg by mouth in the morning and at bedtime. , Disp: , Rfl:       Objective:   Vitals:   02/16/20 0907  BP: 102/60  Pulse: 76  Temp: (!) 96.5 F (35.8 C)  TempSrc: Other (Comment)  SpO2: 97%  Weight: 162 lb 12.8 oz (73.8 kg)  Height: _0  (1.702 m)    Estimated body mass index is 25.5 kg/m as calculated from the following:   Height as of this encounter: _1  (1.702 m).   Weight as of this encounter: 162 lb 12.8 oz (73.8 kg).  _2 @  Filed Weights   02/16/20 0907  Weight: 162 lb 12.8 oz (73.8 kg)     Physical Exam Frail male with a walker.  He is kyphotic his scaphoid.  He is cachectic.  He is got a scar on his mid back of previous spine surgery.  He has significant rheumatoid arthritis deformities.  Despite this he dropped today.  Alert and oriented x3 Velcro crackles present.  Has a beard normal oral cavity.    Assessment:       ICD-10-CM   1. Interstitial lung disease due to connective tissue disease ([place])  J84.89    M35.9   2. Immunocompromised state ([place])  D84.9   3. Vaccine counseling  Z71.85   4. Recent unintentional weight loss over several months  R63.4    I explained to him that he has progressive ILD over the last 4 years.  This meets indication for antifibrotic's.  Discussed the 2 antifibrotic's of nintedanib and pirfenidone.  Nintedanib is better studied in the setting of non-- IPF progressive ILD.  However he has some amount of diarrhea he also has weight loss and is on anticoagulation and has had previous coronary artery disease.  Therefore I recommended against this as first-line  Esbriet is well studied with IPF but not with non-- IPF but no reason to think it will not work.  It can also cause weight loss and low appetite but less of diarrhea.   Also no known coronary artery disease or anticoagulant side effects.  Therefore he prefers this.  I did caution him that with a significant amount of weight loss and polypharmacy and frail lady that overall he may not tolerate these medications.  Nevertheless he wants to take this.  Therefore we have written a prescription after extensive counseling  His last bronchoscopy was in 2017 he had a lot of lymphocytes.  He has been on immunosuppressants then and this is ongoing.  He has progressive ILD.  Therefore I think he meets indications for bronchoscopy with lavage to rule out opportunistic infections.  He is agreeable to this.Risks of pneumothorax, hemothorax, sedation/anesthesia complications such as cardiac or respiratory arrest or hypotension, stroke and bleeding all explained. Benefits of diagnosis but limitations of non-diagnosis also explained. Patient verbalized understanding and wished to proceed.      Plan:     Patient Instructions     ICD-10-CM   1. Interstitial lung disease due to connective tissue disease ([place])  J84.89    M35.9   2. Immunocompromised state ([place])  D84.9   3. Vaccine counseling  Z71.85   4. Recent unintentional weight loss over several months  R63.4     You have slowly progressive interstitial lung disease  between 2017 and 2021 Within 2021 it seems stable Most likely this is due to rheumatoid arthritis causing progressive pulmonary fibrosis over time Opportunistic infections in the setting of immunosuppressed status is another possibility  Being immunosuppressed you might not have mounted a full antibody response to you to Covid vaccine  Plan -Get CBC, chemistry, liver function, PT, PTT test blood work -Get Covid IgG blood work > if negative then you might need a booster sooner than the later -We will look at scheduling a bronchoscopy with lavage to rule out opportunistic infections  -We will aim for sometime between February 27, 2020 and March 01, 2020 at  [facility].  Await further instructions in the next few to several days -Start pirfenidone [we discussed this extensively)  -I will just initially unintentional weight loss go for the lower dose given his significant recent weight loss.   - needs insurance and charity approval    FOLLOWUP  - 6 WEEKS WITH Dr Chase Caller in 30 min slot   ( Level 05 visit: Estb 40-54 min   in  visit type: on-site physical face to visit  in total care time and counseling or/and coordination of care by this undersigned MD - Dr Brand Males. This includes one or more of the following on this same day 02/16/2020: pre-charting, chart review, note writing, documentation discussion of test results, diagnostic or treatment recommendations, prognosis, risks and benefits of management options, instructions, education, compliance or risk-factor reduction. It excludes time spent by the [facility] or office staff in the care of the patient. Actual time 40 min)   SIGNATURE    Dr. Brand Males, M.D., F.C.C.P,  Pulmonary and Critical Care Medicine Staff Physician, [place] Director - Interstitial Lung Disease  Program  Pulmonary [place] at [place], [place], [address]  Pager: [phone], If no answer or between  15:00h - 7:00h: call [phone] Telephone: [phone]  10:09 AM 02/16/2020

## 2020-02-16 NOTE — Telephone Encounter (Signed)
Submitted a Prior Authorization request to [employer] for [place] via Cover My Meds. Will update once we receive a response.   (Key: [id number]) - [id number]

## 2020-02-17 NOTE — Telephone Encounter (Signed)
Faxed patient documents to [facility] for [place] and Patient Assistance.  Called patient and provided update. Patient provided income and household size ([phone]) to provide for grant if needed. Patient stated his secondary insurance should pick up additional copay. Advised patient we will await notification from [place] and can apply for grant if needed.  Phone- [phone]

## 2020-02-17 NOTE — Telephone Encounter (Signed)
Received notification from [facility] regarding a prior authorization for ESBRIET. Authorization has been APPROVED from 11/18/19 to 02/15/21.   Authorization # [id number]

## 2020-02-17 NOTE — Telephone Encounter (Signed)
Ran test claim, patient's copay for 1 month supply is $250.00. Will initiate patient assistance.

## 2020-02-18 ENCOUNTER — Telehealth: Payer: Self-pay | Admitting: Internal Medicine

## 2020-02-18 LAB — WOUND CULTURE

## 2020-02-18 NOTE — Telephone Encounter (Signed)
  Blood work is fine/baseline except Covid IgG antibody is negative.  Therefore I recommend that he get a booster Covid vaccine sooner  Still awaiting data on bronchoscopy  Immunization History  Administered Date(s) Administered  . Fluad Quad(high Dose 65+) 12/30/2018, 02/15/2020  . H1N1 04/28/2008  . Hepatitis A, Adult 09/07/2007, 03/10/2008  . Hepatitis B, adult 09/08/2013, 10/27/2013, 03/02/2014  . Influenza Split 01/17/2011, 02/11/2012, 02/18/2013, 02/10/2015  . Influenza, High Dose Seasonal PF 02/02/2014, 01/25/2016, 01/14/2018  . Influenza-Unspecified 02/13/2017  . PFIZER SARS-COV-2 Vaccination 06/23/2019, 07/19/2019  . PPD Test 02/22/2007, 06/12/2008  . Pneumococcal Conjugate-13 08/10/2014  . Pneumococcal Polysaccharide-23 05/13/2003, 12/08/2007, 01/16/2009, 10/27/2013  . Td 11/19/2011  . Zoster 03/02/2014       LABS    PULMONARY No results for input(s): PHART, PCO2ART, PO2ART, HCO3, TCO2, O2SAT in the last 168 hours.  Invalid input(s): PCO2, PO2  CBC Recent Labs  Lab 02/16/20 1019  HGB 10.9*  HCT 34.3*  WBC 13.6*  PLT 160.0    COAGULATION Recent Labs  Lab 02/16/20 1019  INR 1.4*    CARDIAC  No results for input(s): TROPONINI in the last 168 hours. No results for input(s): PROBNP in the last 168 hours.   CHEMISTRY No results for input(s): NA, K, CL, CO2, GLUCOSE, BUN, CREATININE, CALCIUM, MG, PHOS in the last 168 hours. CrCl cannot be calculated (Patient's most recent lab result is older than the maximum 21 days allowed.).   LIVER Recent Labs  Lab 02/16/20 1019  AST 19  ALT 12  ALKPHOS 66  BILITOT 0.7  PROT 5.8*  ALBUMIN 3.5  INR 1.4*     INFECTIOUS No results for input(s): LATICACIDVEN, PROCALCITON in the last 168 hours.   ENDOCRINE CBG (last 3)  No results for input(s): GLUCAP in the last 72 hours.       IMAGING x48h  - image(s) personally visualized  -   highlighted in bold No results found.

## 2020-02-20 DIAGNOSIS — M5459 Other low back pain: Secondary | ICD-10-CM | POA: Diagnosis not present

## 2020-02-20 DIAGNOSIS — M6281 Muscle weakness (generalized): Secondary | ICD-10-CM | POA: Diagnosis not present

## 2020-02-20 DIAGNOSIS — R2681 Unsteadiness on feet: Secondary | ICD-10-CM | POA: Diagnosis not present

## 2020-02-20 DIAGNOSIS — M25552 Pain in left hip: Secondary | ICD-10-CM | POA: Diagnosis not present

## 2020-02-20 NOTE — Telephone Encounter (Signed)
Attempted to call pt but unable to reach. Left message for him to return call. °

## 2020-02-21 ENCOUNTER — Telehealth: Payer: Self-pay | Admitting: *Deleted

## 2020-02-21 DIAGNOSIS — M359 Systemic involvement of connective tissue, unspecified: Secondary | ICD-10-CM

## 2020-02-21 DIAGNOSIS — J8489 Other specified interstitial pulmonary diseases: Secondary | ICD-10-CM

## 2020-02-21 NOTE — Telephone Encounter (Signed)
Called and spoke with patient He was not informed of the procedure. I let him know Dr. Chase Caller would call and speak with him.  I did Let them know their Bronch is scheduled for 02/27/20 with Dr. Chase Caller at [place] at [facility].  Patient was instructed to arrive at hospital at 0600. They were instructed to bring someone with them as they will not be able to drive home from procedure. Patient instructed not to have anything to eat or drink after midnight. Patient needs to hold their blood thinner 3 days prior to procedure.   Patient's covid screening is scheduled on 02/24/20 at 1000.  Patient voiced understanding,  Routing to Dr. Chase Caller to inform and discuss procedure with patient.

## 2020-02-22 ENCOUNTER — Encounter (HOSPITAL_COMMUNITY): Payer: Self-pay | Admitting: Internal Medicine

## 2020-02-22 ENCOUNTER — Other Ambulatory Visit: Payer: Self-pay

## 2020-02-22 NOTE — Telephone Encounter (Signed)
Called [name]  11:04 AM 02/22/2020 but LMTCB but also told him I will call back

## 2020-02-23 NOTE — Telephone Encounter (Signed)
Got hold of patient at this time on this day.  Explained to him bronchoscopy.  He did not realize that I had already discussed this with him.  Explained the procedure of bronchoscopy flexible video bronchoscopy with sedation.  Explained the rationale for lavage.  Explained the technique of lavage.  Explained there is no purpose right now to do a biopsy but if there is endobronchial lesion then we would do a biopsy.  Risk of hemothorax, pneumothorax sedation complications and limitations such as not being able to get a diagnosis all explained.  Benefits outweigh risks.  He is willing to undergo the procedure.  We will let the procedure pool know that I had this conversation     SIGNATURE    Dr. Brand Males, M.D., F.C.C.P,  Pulmonary and Critical Care Medicine Staff Physician, [place] Director - Interstitial Lung Disease  Program  Pulmonary [place] at [place], [place], [address]  Pager: [phone], If no answer  OR between  19:00-7:00h: page [phone] Telephone (clinical office): [phone] Telephone (research): [phone]  1:44 PM 02/23/2020

## 2020-02-24 ENCOUNTER — Other Ambulatory Visit (HOSPITAL_COMMUNITY)
Admission: RE | Admit: 2020-02-24 | Discharge: 2020-02-24 | Disposition: A | Discharge status: Home / Self Care | Payer: Medicare Other | Source: Ambulatory Visit | Attending: Internal Medicine | Admitting: Internal Medicine

## 2020-02-24 ENCOUNTER — Telehealth: Payer: Self-pay | Admitting: Internal Medicine

## 2020-02-24 DIAGNOSIS — I2510 Atherosclerotic heart disease of native coronary artery without angina pectoris: Secondary | ICD-10-CM | POA: Diagnosis not present

## 2020-02-24 DIAGNOSIS — I10 Essential (primary) hypertension: Secondary | ICD-10-CM | POA: Diagnosis not present

## 2020-02-24 DIAGNOSIS — T84031D Mechanical loosening of internal left hip prosthetic joint, subsequent encounter: Secondary | ICD-10-CM | POA: Diagnosis not present

## 2020-02-24 DIAGNOSIS — T8452XD Infection and inflammatory reaction due to internal left hip prosthesis, subsequent encounter: Secondary | ICD-10-CM | POA: Diagnosis not present

## 2020-02-24 DIAGNOSIS — Z01812 Encounter for preprocedural laboratory examination: Secondary | ICD-10-CM | POA: Diagnosis not present

## 2020-02-24 DIAGNOSIS — Z20822 Contact with and (suspected) exposure to covid-19: Secondary | ICD-10-CM | POA: Insufficient documentation

## 2020-02-24 DIAGNOSIS — E1142 Type 2 diabetes mellitus with diabetic polyneuropathy: Secondary | ICD-10-CM | POA: Diagnosis not present

## 2020-02-24 DIAGNOSIS — B952 Enterococcus as the cause of diseases classified elsewhere: Secondary | ICD-10-CM | POA: Diagnosis not present

## 2020-02-24 LAB — SARS CORONAVIRUS 2 (TAT 6-24 HRS): SARS Coronavirus 2: NEGATIVE

## 2020-02-24 NOTE — Telephone Encounter (Signed)
Called [facility] and spoke with pharmacist [facility] and gave her the Esbriet titration instructions for pt's Rx. [name] stated she would get this taken care of and get med to pt. Nothing further needed.

## 2020-02-24 NOTE — Telephone Encounter (Signed)
[place] for update on patient's status, rep advised patient was set up with [facility] and they will be reaching out to patient to coordinate shipment.  Phone# [phone]

## 2020-02-25 ENCOUNTER — Other Ambulatory Visit: Payer: Self-pay | Admitting: Internal Medicine

## 2020-02-25 DIAGNOSIS — R600 Localized edema: Secondary | ICD-10-CM

## 2020-02-25 NOTE — Anesthesia Preprocedure Evaluation (Addendum)
Anesthesia Evaluation  Patient identified by MRN, date of birth, ID band Patient awake    Reviewed: Allergy & Precautions, NPO status , Patient's Chart, lab work & pertinent test results, reviewed documented beta blocker date and time   History of Anesthesia Complications (+) PONV  Airway Mallampati: I  TM Distance: >3 FB Neck ROM: Full    Dental  (+) Dental Advisory Given   Pulmonary sleep apnea (does not require CPAP) and Oxygen sleep apnea ,  02/24/2020 SARS coronavirus NEG Pulmonary fibrosis: home O2   breath sounds clear to auscultation       Cardiovascular hypertension, Pt. on medications and Pt. on home beta blockers (-) angina+ CAD, + CABG, + Peripheral Vascular Disease and + DVT   Rhythm:Regular Rate:Normal  '20 ECHO: EF 60-65%, mild LVH, mild AS with Peak aortic velocity: 2.52ms, mean gradient 982mg.    Neuro/Psych Anxiety Depression TIA   GI/Hepatic Neg liver ROS, GERD  Medicated and Controlled,  Endo/Other  negative endocrine ROS  Renal/GU negative Renal ROS     Musculoskeletal  (+) Arthritis , steroids,  Fibromyalgia -  Abdominal   Peds  Hematology  (+) Blood dyscrasia (Hb 10.9), anemia , HIV, H/o CLL   Anesthesia Other Findings   Reproductive/Obstetrics                            Anesthesia Physical Anesthesia Plan  ASA: III  Anesthesia Plan: MAC   Post-op Pain Management:    Induction:   PONV Risk Score and Plan: 2 and Ondansetron and Dexamethasone  Airway Management Planned: Natural Airway and Nasal Cannula  Additional Equipment: None  Intra-op Plan:   Post-operative Plan:   Informed Consent: I have reviewed the patients History and Physical, chart, labs and discussed the procedure including the risks, benefits and alternatives for the proposed anesthesia with the patient or authorized representative who has indicated his/her understanding and acceptance.      Dental advisory given  Plan Discussed with: CRNA and Surgeon  Anesthesia Plan Comments:        Anesthesia Quick Evaluation

## 2020-02-27 ENCOUNTER — Ambulatory Visit (HOSPITAL_COMMUNITY): Payer: Medicare Other | Admitting: Anesthesiology

## 2020-02-27 ENCOUNTER — Encounter (HOSPITAL_COMMUNITY): Payer: Self-pay | Admitting: Internal Medicine

## 2020-02-27 ENCOUNTER — Other Ambulatory Visit: Payer: Self-pay

## 2020-02-27 ENCOUNTER — Encounter (HOSPITAL_COMMUNITY)
Admission: RE | Disposition: A | Discharge status: Home / Self Care | Payer: Self-pay | Source: Home / Self Care | Attending: Internal Medicine

## 2020-02-27 ENCOUNTER — Telehealth: Payer: Self-pay | Admitting: Internal Medicine

## 2020-02-27 ENCOUNTER — Ambulatory Visit (HOSPITAL_COMMUNITY)
Admission: RE | Admit: 2020-02-27 | Discharge: 2020-02-27 | Disposition: A | Discharge status: Home / Self Care | Payer: Medicare Other | Attending: Internal Medicine | Admitting: Internal Medicine

## 2020-02-27 DIAGNOSIS — Z7952 Long term (current) use of systemic steroids: Secondary | ICD-10-CM | POA: Insufficient documentation

## 2020-02-27 DIAGNOSIS — Z79899 Other long term (current) drug therapy: Secondary | ICD-10-CM | POA: Diagnosis not present

## 2020-02-27 DIAGNOSIS — Z86718 Personal history of other venous thrombosis and embolism: Secondary | ICD-10-CM | POA: Insufficient documentation

## 2020-02-27 DIAGNOSIS — B20 Human immunodeficiency virus [HIV] disease: Secondary | ICD-10-CM | POA: Insufficient documentation

## 2020-02-27 DIAGNOSIS — J8410 Pulmonary fibrosis, unspecified: Secondary | ICD-10-CM | POA: Diagnosis not present

## 2020-02-27 DIAGNOSIS — J849 Interstitial pulmonary disease, unspecified: Secondary | ICD-10-CM

## 2020-02-27 DIAGNOSIS — I10 Essential (primary) hypertension: Secondary | ICD-10-CM | POA: Diagnosis not present

## 2020-02-27 DIAGNOSIS — D84821 Immunodeficiency due to drugs: Secondary | ICD-10-CM | POA: Diagnosis not present

## 2020-02-27 DIAGNOSIS — M069 Rheumatoid arthritis, unspecified: Secondary | ICD-10-CM | POA: Insufficient documentation

## 2020-02-27 DIAGNOSIS — Z7901 Long term (current) use of anticoagulants: Secondary | ICD-10-CM | POA: Diagnosis not present

## 2020-02-27 DIAGNOSIS — M879 Osteonecrosis, unspecified: Secondary | ICD-10-CM | POA: Insufficient documentation

## 2020-02-27 DIAGNOSIS — J9611 Chronic respiratory failure with hypoxia: Secondary | ICD-10-CM | POA: Diagnosis not present

## 2020-02-27 DIAGNOSIS — Z96653 Presence of artificial knee joint, bilateral: Secondary | ICD-10-CM | POA: Diagnosis not present

## 2020-02-27 DIAGNOSIS — Z6825 Body mass index (BMI) 25.0-25.9, adult: Secondary | ICD-10-CM | POA: Diagnosis not present

## 2020-02-27 DIAGNOSIS — I25810 Atherosclerosis of coronary artery bypass graft(s) without angina pectoris: Secondary | ICD-10-CM | POA: Diagnosis not present

## 2020-02-27 DIAGNOSIS — Z96642 Presence of left artificial hip joint: Secondary | ICD-10-CM | POA: Diagnosis not present

## 2020-02-27 DIAGNOSIS — Z9981 Dependence on supplemental oxygen: Secondary | ICD-10-CM | POA: Insufficient documentation

## 2020-02-27 DIAGNOSIS — R634 Abnormal weight loss: Secondary | ICD-10-CM | POA: Insufficient documentation

## 2020-02-27 DIAGNOSIS — D849 Immunodeficiency, unspecified: Secondary | ICD-10-CM | POA: Diagnosis not present

## 2020-02-27 DIAGNOSIS — D509 Iron deficiency anemia, unspecified: Secondary | ICD-10-CM | POA: Diagnosis not present

## 2020-02-27 HISTORY — PX: BRONCHIAL WASHINGS: SHX5105

## 2020-02-27 HISTORY — DX: Dependence on supplemental oxygen: Z99.81

## 2020-02-27 HISTORY — PX: VIDEO BRONCHOSCOPY: SHX5072

## 2020-02-27 LAB — BODY FLUID CELL COUNT WITH DIFFERENTIAL

## 2020-02-27 LAB — PNEUMOCYSTIS JIROVECI SMEAR BY DFA: Pneumocystis jiroveci Ag: NEGATIVE

## 2020-02-27 SURGERY — BRONCHOSCOPY, WITH FLUOROSCOPY
Anesthesia: Monitor Anesthesia Care

## 2020-02-27 MED ORDER — LIDOCAINE HCL URETHRAL/MUCOSAL 2 % EX GEL
CUTANEOUS | Status: AC
  Filled 2020-02-27: qty 30

## 2020-02-27 MED ORDER — LIDOCAINE 2% (20 MG/ML) 5 ML SYRINGE
INTRAMUSCULAR | Status: DC | PRN
  Administered 2020-02-27: 20 mg via INTRAVENOUS

## 2020-02-27 MED ORDER — FENTANYL CITRATE (PF) 100 MCG/2ML IJ SOLN
INTRAMUSCULAR | Status: AC
  Filled 2020-02-27: qty 2

## 2020-02-27 MED ORDER — MIDAZOLAM HCL 2 MG/2ML IJ SOLN
INTRAMUSCULAR | Status: AC
  Filled 2020-02-27: qty 2

## 2020-02-27 MED ORDER — LIDOCAINE HCL (PF) 4 % IJ SOLN
INTRAMUSCULAR | Status: AC
  Filled 2020-02-27: qty 5

## 2020-02-27 MED ORDER — LIDOCAINE HCL URETHRAL/MUCOSAL 2 % EX GEL
CUTANEOUS | Status: DC | PRN
  Administered 2020-02-27: 1

## 2020-02-27 MED ORDER — LIDOCAINE HCL URETHRAL/MUCOSAL 2 % EX GEL: 1.0000 "application " | Freq: Once | CUTANEOUS | Status: DC

## 2020-02-27 MED ORDER — PHENYLEPHRINE HCL 0.25 % NA SOLN: 1.0000 | Freq: Four times a day (QID) | NASAL | Status: DC | PRN

## 2020-02-27 MED ORDER — ONDANSETRON HCL 4 MG/2ML IJ SOLN
INTRAMUSCULAR | Status: DC | PRN
  Administered 2020-02-27: 4 mg via INTRAVENOUS

## 2020-02-27 MED ORDER — LACTATED RINGERS IV SOLN: INTRAVENOUS | Status: DC

## 2020-02-27 MED ORDER — LIDOCAINE HCL 1 % IJ SOLN
INTRAMUSCULAR | Status: AC
  Filled 2020-02-27: qty 1

## 2020-02-27 MED ORDER — DEXAMETHASONE SODIUM PHOSPHATE 10 MG/ML IJ SOLN
INTRAMUSCULAR | Status: DC | PRN
  Administered 2020-02-27: 8 mg via INTRAVENOUS

## 2020-02-27 MED ORDER — PROPOFOL 10 MG/ML IV BOLUS
INTRAVENOUS | Status: AC
  Filled 2020-02-27: qty 20

## 2020-02-27 MED ORDER — LIDOCAINE HCL (PF) 4 % IJ SOLN
4.0000 mL | Freq: Once | INTRAMUSCULAR | Status: AC
  Administered 2020-02-27: 4 mL via RESPIRATORY_TRACT

## 2020-02-27 MED ORDER — PROPOFOL 10 MG/ML IV BOLUS
INTRAVENOUS | Status: DC | PRN
  Administered 2020-02-27: 130 ug/kg/min via INTRAVENOUS

## 2020-02-27 MED ORDER — MIDAZOLAM HCL 5 MG/5ML IJ SOLN
INTRAMUSCULAR | Status: DC | PRN
  Administered 2020-02-27: 1 mg via INTRAVENOUS

## 2020-02-27 MED ORDER — PHENYLEPHRINE HCL 1 % NA SOLN
NASAL | Status: AC
  Filled 2020-02-27: qty 15

## 2020-02-27 MED ORDER — LIDOCAINE HCL (PF) 1 % IJ SOLN
INTRAMUSCULAR | Status: DC | PRN
  Administered 2020-02-27: 2 mL

## 2020-02-27 MED ORDER — PHENYLEPHRINE HCL 0.5 % NA SOLN
NASAL | Status: DC | PRN
  Administered 2020-02-27: 1 [drp] via NASAL

## 2020-02-27 MED ORDER — PHENYLEPHRINE HCL 0.25 % NA SOLN
NASAL | Status: AC
  Filled 2020-02-27: qty 15

## 2020-02-27 MED ORDER — BUTAMBEN-TETRACAINE-BENZOCAINE 2-2-14 % EX AERO: 1.0000 | INHALATION_SPRAY | Freq: Once | CUTANEOUS | Status: DC

## 2020-02-27 MED ORDER — PROPOFOL 10 MG/ML IV BOLUS
INTRAVENOUS | Status: AC
  Filled 2020-02-27: qty 40

## 2020-02-27 NOTE — Discharge Instructions (Signed)
Please have someone to drive you home Please be careful with activities for next 24 hours You can eat 2-4 hours after getting home provided you are fully alert, able to cough, and are not nauseated or vomiting and     feel well You are expected to have low grade fever or cough some amount of blood for next 24-48 hours; if this worsens call us   IF you are very short of breath or coughing blood or chest pain or not feeling well, call us [phone]  anytime or go to emergency room  Please call [phone]  for followup appointment or we will call you and fix one  

## 2020-02-27 NOTE — Interval H&P Note (Signed)
PAtient presented for bronch with bAL for progressive ILD. HE uses o2 at night. Pulse ox 92% on RA at rest - 91%. Given ILD and soft pulse ox after d/w endoscopy nurses and Dr Marisue Brooklyn team decided diprivan sedation over moderate sedation safer approach. Patient is NPO and no new changes since last visit < 61 days old itehr than fall x1 with some low back bruusing per hx NPO confirmed. He is off edoxaban for > 3 days   VItals reviewed  Exam no change - neuro intact no bleeding  A Progressive ILD HIV, RA - immunosuppressed  Plan  - bronch with BAL +/- endobronchial biopsy     SIGNATURE    Dr. Brand Males, M.D., F.C.C.P,  Pulmonary and Critical Care Medicine Staff Physician, [place] Director - Interstitial Lung Disease  Program  [facility] [place], [place], [address]  Pager: [phone], If no answer  OR between  19:00-7:00h: page [phone] Telephone (clinical office): [phone] Telephone (research): [phone]  7:54 AM 02/27/2020

## 2020-02-27 NOTE — Telephone Encounter (Signed)
Spoke with the pt  He states the [employer] pt assistance program needs dx code  I called the number that he provided me- [phone] and it's just a survey that has nothing to do with [employer]   Pharm team- do you by chance have a better number that we can call? Thanks so much!

## 2020-02-27 NOTE — Telephone Encounter (Signed)
Called and spoke to [facility], they state patient's shipment is ready to be scheduled. Called patient and advised. He will call to set up shipment. Advised him to let office know if he has any issues.

## 2020-02-27 NOTE — Anesthesia Postprocedure Evaluation (Signed)
Anesthesia Post Note  Patient: [name]  Procedure(s) Performed: VIDEO BRONCHOSCOPY (N/A )     Patient location during evaluation: Endoscopy Anesthesia Type: MAC Level of consciousness: awake and alert, patient cooperative and oriented Pain management: pain level controlled Vital Signs Assessment: post-procedure vital signs reviewed and stable Respiratory status: spontaneous breathing, nonlabored ventilation and respiratory function stable Cardiovascular status: blood pressure returned to baseline and stable Postop Assessment: no apparent nausea or vomiting Anesthetic complications: no   No complications documented.  Last Vitals:  Vitals:   02/27/20 0845 02/27/20 0850  BP: (!) 150/75 (!) 142/72  Pulse: 68 71  Resp: 12 (!) 23  Temp:    SpO2: 97% 92%    Last Pain:  Vitals:   02/27/20 0850  TempSrc:   PainSc: 0-No pain                 Dottie Vaquerano,E. Tyree Vandruff

## 2020-02-27 NOTE — Telephone Encounter (Signed)
Patient gave telephone number incorrectly- [phone], [facility], [facility].  Called and provided dx code and completed patient's enrollment. Patient has been Approved for $9,000 copay assistance grant through 01/26/21.  Pharmacy Card [id number]  [phone] [id number]  [id number]  Called and provided billing information to [facility]. Nothing further is needed.

## 2020-02-27 NOTE — Transfer of Care (Signed)
Immediate Anesthesia Transfer of Care Note  Patient: [name]  Procedure(s) Performed: VIDEO BRONCHOSCOPY (N/A )  Patient Location: PACU and Endoscopy Unit  Anesthesia Type:MAC  Level of Consciousness: awake, alert , oriented and patient cooperative  Airway & Oxygen Therapy: Patient Spontanous Breathing and Patient connected to face mask oxygen  Post-op Assessment: Report given to RN, Post -op Vital signs reviewed and stable and Patient moving all extremities  Post vital signs: Reviewed and stable  Last Vitals:  Vitals Value Taken Time  BP    Temp    Pulse    Resp    SpO2      Last Pain:  Vitals:   02/27/20 0718  TempSrc: Oral  PainSc: 0-No pain         Complications: No complications documented.

## 2020-02-28 LAB — CYTOLOGY - NON PAP

## 2020-02-28 NOTE — Telephone Encounter (Signed)
Called and spoke with pt letting him know the results of labwork and he verbalized understanding. Pt had bronch performed yesterday 10/18. Nothing further needed.

## 2020-02-29 ENCOUNTER — Encounter (HOSPITAL_COMMUNITY): Payer: Self-pay | Admitting: Internal Medicine

## 2020-02-29 DIAGNOSIS — Z23 Encounter for immunization: Secondary | ICD-10-CM | POA: Diagnosis not present

## 2020-02-29 LAB — CULTURE, RESPIRATORY W GRAM STAIN
Culture: NORMAL
Gram Stain: NONE SEEN

## 2020-02-29 LAB — MTB RIF NAA NON-SPUTUM, W/O CULTURE

## 2020-03-01 LAB — ACID FAST SMEAR (AFB, MYCOBACTERIA): Acid Fast Smear: NEGATIVE

## 2020-03-02 DIAGNOSIS — M6281 Muscle weakness (generalized): Secondary | ICD-10-CM | POA: Diagnosis not present

## 2020-03-02 DIAGNOSIS — M25552 Pain in left hip: Secondary | ICD-10-CM | POA: Diagnosis not present

## 2020-03-02 DIAGNOSIS — M5459 Other low back pain: Secondary | ICD-10-CM | POA: Diagnosis not present

## 2020-03-02 DIAGNOSIS — R2681 Unsteadiness on feet: Secondary | ICD-10-CM | POA: Diagnosis not present

## 2020-03-05 ENCOUNTER — Encounter (HOSPITAL_COMMUNITY): Payer: Self-pay | Admitting: Emergency Medicine

## 2020-03-05 ENCOUNTER — Emergency Department (HOSPITAL_COMMUNITY): Payer: Medicare Other

## 2020-03-05 ENCOUNTER — Emergency Department (HOSPITAL_COMMUNITY)
Admission: EM | Admit: 2020-03-05 | Discharge: 2020-03-05 | Disposition: A | Discharge status: Home / Self Care | Payer: Medicare Other | Attending: Emergency Medicine | Admitting: Emergency Medicine

## 2020-03-05 ENCOUNTER — Other Ambulatory Visit: Payer: Self-pay

## 2020-03-05 DIAGNOSIS — Z91010 Allergy to peanuts: Secondary | ICD-10-CM | POA: Insufficient documentation

## 2020-03-05 DIAGNOSIS — Y93E1 Activity, personal bathing and showering: Secondary | ICD-10-CM | POA: Diagnosis not present

## 2020-03-05 DIAGNOSIS — W182XXA Fall in (into) shower or empty bathtub, initial encounter: Secondary | ICD-10-CM | POA: Diagnosis not present

## 2020-03-05 DIAGNOSIS — E1142 Type 2 diabetes mellitus with diabetic polyneuropathy: Secondary | ICD-10-CM | POA: Diagnosis not present

## 2020-03-05 DIAGNOSIS — Z79899 Other long term (current) drug therapy: Secondary | ICD-10-CM | POA: Insufficient documentation

## 2020-03-05 DIAGNOSIS — I5033 Acute on chronic diastolic (congestive) heart failure: Secondary | ICD-10-CM | POA: Diagnosis not present

## 2020-03-05 DIAGNOSIS — I110 Hypertensive heart disease with heart failure: Secondary | ICD-10-CM | POA: Insufficient documentation

## 2020-03-05 DIAGNOSIS — I2510 Atherosclerotic heart disease of native coronary artery without angina pectoris: Secondary | ICD-10-CM | POA: Diagnosis not present

## 2020-03-05 DIAGNOSIS — Z951 Presence of aortocoronary bypass graft: Secondary | ICD-10-CM | POA: Insufficient documentation

## 2020-03-05 DIAGNOSIS — S3219XA Other fracture of sacrum, initial encounter for closed fracture: Secondary | ICD-10-CM | POA: Diagnosis not present

## 2020-03-05 DIAGNOSIS — S3210XA Unspecified fracture of sacrum, initial encounter for closed fracture: Secondary | ICD-10-CM | POA: Diagnosis not present

## 2020-03-05 DIAGNOSIS — W19XXXA Unspecified fall, initial encounter: Secondary | ICD-10-CM

## 2020-03-05 DIAGNOSIS — Z043 Encounter for examination and observation following other accident: Secondary | ICD-10-CM | POA: Diagnosis not present

## 2020-03-05 DIAGNOSIS — S3992XA Unspecified injury of lower back, initial encounter: Secondary | ICD-10-CM | POA: Diagnosis present

## 2020-03-05 DIAGNOSIS — Z96652 Presence of left artificial knee joint: Secondary | ICD-10-CM | POA: Diagnosis not present

## 2020-03-05 DIAGNOSIS — Z96642 Presence of left artificial hip joint: Secondary | ICD-10-CM | POA: Insufficient documentation

## 2020-03-05 NOTE — Discharge Instructions (Addendum)
You do have a sacral fracture, but this is not a surgical injury and should heal without complication.  Continue using your hydrocodone for pain relief - ice and heat can also give some relief of symptoms.  Use a donut pillow when sitting. Make sure you are not getting constipated as straining to have a bowel movement can be problematic - hydrocodone can cause constipation as well - I recommend a daily stool softener to avoid this problem.

## 2020-03-05 NOTE — ED Triage Notes (Signed)
Pt fell in shower x1 week ago. Fell on tail bone and states the pain increases each day. Pt took hydrocodone 5-325 pta.

## 2020-03-05 NOTE — ED Provider Notes (Signed)
[facility] EMERGENCY DEPARTMENT Provider Note   CSN: [id number] Arrival date & time: 03/05/20  1320     History Chief Complaint  Patient presents with  . Tailbone Pain    [name] is a 69 y.o. male with a history of chronic back pain, rheumatoid arthritis with multiple orthopedic surgeries including left hip arthroplasty with multiple revisions, lumbar spinal surgery, presenting with worsening tailbone pain described as sharp stabbing pain with certain positions and palpation from injury sustained when he slipped in the shower landing directly on his tailbone one week ago.  He washed his feet and forgot to rinse them and when he tried to stand he slid landing on his buttocks. He denies any other injury including head injury.  He has left over hydrocodone from his last hip revision and has taken one daily with some transient improvement in pain.  He denies numbness or weakness in his legs, and no new urinary or fecal incontinence.    The history is provided by the patient.       Past Medical History:  Diagnosis Date  . Allergy   . Anxiety   . Carotid artery occlusion    40-60% right ICA stenosis (09/2008)  . Cataract   . Chronic back pain   . CLL (chronic lymphoblastic leukemia) dx 2010   Followed at [facility] q56mo, no current therapy   . Clotting disorder ([place])   . Coronary artery disease 2010   s/p CABG '10, sees Dr. Percival Spanish  . Depression   . Diverticulosis   . DVT, lower extremity, recurrent ([place]) 2008, 2009   LLE, chronic anticoag since 2009  . Esophagitis   . Fibromyalgia   . Gallstones   . GERD (gastroesophageal reflux disease)   . Gout   . Gynecomastia, male   . H/O hiatal hernia 2008   surgery  . Hemorrhoids   . Hepatitis A yrs ago  . HIV infection ([place]) dx 1993  . Hypertension   . Impotence of organic origin   . Myocardial infarction ([place]) 2010    x 2  . Neuromuscular disorder (HCC)    neuropathy  . On home oxygen therapy    2L [place] at bedtime  .  Osteoarthritis, knee    s/p B TKA  . Osteoporosis   . PONV (postoperative nausea and vomiting)   . Pulmonary fibrosis ([place]) 2017  . Rheumatoid arthritis(714.0) dx 2010   MTX, follows with rheum  . Seasonal allergies   . Secondary syphilis 07/24/14 dx   s/p 2 wks doxy  . Status post dilation of esophageal narrowing   . Stroke [facility]) 1969   TIA  . TIA (transient ischemic attack) 1997   mild residual L mouth droop  . Tubular adenoma of colon     Patient Active Problem List   Diagnosis Date Noted  . Left buttock abscess 02/15/2020  . Skin mass 02/15/2020  . Chronic respiratory failure with hypoxia ([place]) 12/22/2019  . Diastolic dysfunction with acute on chronic heart failure ([place]) 12/10/2019  . Ecchymoses, spontaneous 11/29/2019  . Sleep apnea in adult 11/29/2019  . Diuretic-induced hypokalemia 11/24/2019  . Hypokalemia due to inadequate potassium intake 11/22/2019  . Septic arthritis of pelvic region and thigh region ([place]) 11/15/2019  . Prosthetic joint infection of left hip ([place]) 11/08/2019  . Aortic atherosclerosis ([place]) 10/20/2019  . Neuropathy due to HIV ([place]) 08/17/2019  . Benign prostatic hyperplasia 08/04/2019  . Chronic bronchitis, mucopurulent ([place]) 08/01/2019  . Drug-induced polyneuropathy ([place]) 06/14/2019  .  Educated about COVID-19 virus infection 06/05/2019  . Personal history of TIA (transient ischemic attack) 04/13/2019  . HIV-1 associated autonomic neuropathy ([place]) 04/13/2019  . Impaired functional mobility, balance, gait, and endurance 04/13/2019  . Cerebellar ataxia in diseases classified elsewhere ([place]) 04/13/2019  . Recurrent major depressive disorder, in partial remission ([place]) 11/23/2018  . Atherosclerosis of coronary artery bypass graft(s), unspecified, with other forms of angina pectoris ([place]) 11/23/2018  . Murmur, cardiac 11/23/2018  . Iron deficiency anemia due to dietary causes 11/17/2018  . Bilateral leg edema 11/16/2018  . Dislocation of hip  prosthesis ([place])   . Avascular necrosis of bone of hip, left ([place]) 08/09/2018  . Eczema 07/01/2018  . Candida rash of groin 07/01/2018  . Depression with anxiety 01/14/2018  . 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 Carotid artery disease ([place]) 11/13/2016  . Stenosis of carotid artery 11/13/2016  . Spinal stenosis of thoracolumbar region 07/02/2016  . ILD (interstitial lung disease) ([place]) 09/13/2015  . Immunosuppressed status ([place]) 09/13/2015  . Tinea cruris 09/12/2015  . PCP (pneumocystis jiroveci pneumonia) ([place]) 06/18/2015  . Postinflammatory pulmonary fibrosis ([place]) 05/11/2015  . GERD (gastroesophageal reflux disease)   . Hereditary and idiopathic peripheral neuropathy 09/08/2013  . Erosive esophagitis 12/28/2012  . Allergic rhinitis, cause unspecified 04/28/2012  . Long term current use of anticoagulant therapy 02/03/2012  . Hypertension   . Gout   . Carotid artery occlusion   . Hyperlipidemia with target LDL less than 100   . HIV infection ([place]) 04/08/2011  . Arthritis, rheumatoid ([place]) 04/08/2011  . CLL (chronic lymphoid leukemia) in relapse ([place]) 04/08/2011  . Type 2 diabetes mellitus with diabetic polyneuropathy, without long-term current use of insulin ([place]) 04/02/2011  . CAD (coronary artery disease) of artery bypass graft 02/14/2009  . Herpes genitalis 06/22/2008    Past Surgical History:  Procedure Laterality Date  . ACETABULAR REVISION Left 11/03/2019   REVISION LEFT HIP ACETABULAR COMPONENT (Left Hip)  . ANTERIOR HIP REVISION Left 08/17/2018   Procedure: ANTERIOR LEFT HIP REVISION;  Surgeon: Mcarthur Rossetti, MD;  Location: [place];  Service: Orthopedics;  Laterality: Left;  . ANTERIOR HIP REVISION Left 11/02/2018   Procedure: LEFT HIP CONSTRAINED LINER REVISION;  Surgeon: Mcarthur Rossetti, MD;  Location: [place];  Service: Orthopedics;  Laterality: Left;  . ANTERIOR HIP REVISION Left 11/03/2019   Procedure: REVISION LEFT HIP ACETABULAR COMPONENT;   Surgeon: Mcarthur Rossetti, MD;  Location: [place];  Service: Orthopedics;  Laterality: Left;  . BACK SURGERY  2010 & 2017  . BRONCHIAL WASHINGS  02/27/2020   Procedure: BRONCHIAL WASHINGS;  Surgeon: Brand Males, MD;  [facility]: [facility];  Service: Cardiopulmonary;;  . CHOLECYSTECTOMY    . COLONOSCOPY WITH PROPOFOL N/A 12/28/2012   Procedure: COLONOSCOPY WITH PROPOFOL;  Surgeon: Jerene Bears, MD;  [facility];  Service: Gastroenterology;  Laterality: N/A;  . CORONARY ARTERY BYPASS GRAFT  2010   triple bypass  . ESOPHAGOGASTRODUODENOSCOPY (EGD) WITH PROPOFOL N/A 12/28/2012   Procedure: ESOPHAGOGASTRODUODENOSCOPY (EGD) WITH PROPOFOL;  Surgeon: Jerene Bears, MD;  [facility];  Service: Gastroenterology;  Laterality: N/A;  . ESOPHAGOGASTRODUODENOSCOPY (EGD) WITH PROPOFOL N/A 03/15/2013   Procedure: ESOPHAGOGASTRODUODENOSCOPY (EGD) WITH PROPOFOL;  Surgeon: Jerene Bears, MD;  [facility];  Service: Gastroenterology;  Laterality: N/A;  . ESOPHAGOGASTRODUODENOSCOPY (EGD) WITH PROPOFOL N/A 02/07/2016   Procedure: ESOPHAGOGASTRODUODENOSCOPY (EGD) WITH PROPOFOL;  Surgeon: Jerene Bears, MD;  [facility];  Service: Gastroenterology;  Laterality: N/A;  . HARDWARE REMOVAL  N/A 07/02/2012   Procedure: HARDWARE REMOVAL;  Surgeon: Elaina Hoops, MD;  Location: [facility];  Service: Neurosurgery;  Laterality: N/A;  . HIATAL HERNIA REPAIR     wrap  . HIP CLOSED REDUCTION Left 08/15/2018   Procedure: CLOSED REDUCTION HIP;  Surgeon: Newt Minion, MD;  Location: [place];  Service: Orthopedics;  Laterality: Left;  . HIP CLOSED REDUCTION Left 08/28/2018   Procedure: CLOSED REDUCTION HIP FOR RECURRENT DISLOCATION AND DRESSING CHANGE;  Surgeon: Jessy Oto, MD;  Location: [place];  Service: Orthopedics;  Laterality: Left;  . HIP CLOSED REDUCTION Left 09/15/2018   Procedure: CLOSED REDUCTION HIP DISLOCATION;  Surgeon: Mcarthur Rossetti, MD;  Location: [place];  Service:  Orthopedics;  Laterality: Left;  . HIP CLOSED REDUCTION Left 10/31/2018   Procedure: CLOSED REDUCTION LEFT TOTAL HIP;  Surgeon: Leandrew Koyanagi, MD;  Location: [place];  Service: Orthopedics;  Laterality: Left;  . INGUINAL HERNIA REPAIR Bilateral   . JOINT REPLACEMENT Left 1999  . KNEE ARTHROPLASTY  07/22/2011   Procedure: COMPUTER ASSISTED TOTAL KNEE ARTHROPLASTY;  Surgeon: Meredith Pel, MD;  Location: [place];  Service: Orthopedics;  Laterality: Right;  Right total knee arthroplasty  . MANDIBLE SURGERY Bilateral    tmj  . REPLACEMENT TOTAL KNEE Bilateral   . ring around testicle hernia reapir  184 and 1986   x 2  . ROTATOR CUFF REPAIR Right   . SHOULDER SURGERY Left   . SPINE SURGERY  2010   "rod and screws", "failed", lopwer spine,   . stent to heart x 1  2010  . TONSILLECTOMY    . TOTAL HIP ARTHROPLASTY Left 08/13/2018   Procedure: LEFT TOTAL HIP ARTHROPLASTY ANTERIOR APPROACH;  Surgeon: Mcarthur Rossetti, MD;  Location: [place];  Service: Orthopedics;  Laterality: Left;  . TOTAL HIP ARTHROPLASTY Left 09/17/2018   Procedure: ANTERIOR LEFT HIP REVISION;  Surgeon: Mcarthur Rossetti, MD;  Location: [place];  Service: Orthopedics;  Laterality: Left;  . UMBILICAL HERNIA REPAIR     x 1  . varicose vein     stripping  . VIDEO BRONCHOSCOPY Bilateral 10/16/2015   Procedure: VIDEO BRONCHOSCOPY WITHOUT FLUORO;  Surgeon: Brand Males, MD;  [facility];  Service: Cardiopulmonary;  Laterality: Bilateral;  . VIDEO BRONCHOSCOPY N/A 02/27/2020   Procedure: VIDEO BRONCHOSCOPY;  Surgeon: Brand Males, MD;  [facility];  Service: Cardiopulmonary;  Laterality: N/A;  bronchial lavage       Family History  Problem Relation Age of Onset  . Breast cancer Mother   . Hypertension Mother   . Hyperlipidemia Mother   . Diabetes Mother   . Prostate cancer Father   . Colon polyps Father   . Hyperlipidemia Father   . Crohn's disease Paternal Aunt   . Diabetes Maternal  Grandmother   . Diabetes Brother        x 3  . Heart attack Brother   . Heart disease Brother        x 3  . Heart attack Brother   . Hyperlipidemia Brother        x 3  . Alcohol abuse Daughter   . Drug abuse Daughter   . Asthma Brother   . CVA Brother   . Colon cancer Neg Hx   . Esophageal cancer Neg Hx   . Rectal cancer Neg Hx   . Stomach cancer Neg Hx     Social History   Tobacco Use  . Smoking  status: Never Smoker  . Smokeless tobacco: Never Used  . Tobacco comment: occ wine  Vaping Use  . Vaping Use: Never used  Substance Use Topics  . Alcohol use: Not Currently    Comment: occasional wine- 1-2 per week  . Drug use: Not Currently    Types: Marijuana    Comment: 1 month ago    Home Medications Prior to Admission medications   Medication Sig Start Date End Date Taking? Authorizing Provider  acetaminophen (TYLENOL) 500 MG tablet Take 500-1,000 mg by mouth every 6 (six) hours as needed for mild pain or moderate pain.     [provider]  alfuzosin (UROXATRAL) 10 MG 24 hr tablet Take 1 tablet (10 mg total) by mouth daily with breakfast. Patient not taking: Reported on 02/21/2020 12/08/19   Cleon Gustin, MD  atorvastatin (LIPITOR) 10 MG tablet Take 1 tablet (10 mg total) by mouth at bedtime. 11/15/19   Angiulli, Lavon Paganini, PA-C  bictegravir-emtricitabine-tenofovir AF (BIKTARVY) 50-200-25 MG TABS tablet Take 1 tablet by mouth daily.  08/18/19   [provider]  darunavir-cobicistat (PREZCOBIX) 800-150 MG tablet Take 1 tablet by mouth daily with breakfast.  09/15/19   [provider]  docusate sodium (COLACE) 100 MG capsule Take 1 capsule (100 mg total) by mouth 2 (two) times daily. Patient not taking: Reported on 02/21/2020 11/15/19   Angiulli, Lavon Paganini, PA-C  EPINEPHrine (EPIPEN 2-PAK) 0.3 mg/0.3 mL IJ SOAJ injection Inject 0.3 mg into the muscle as needed for anaphylaxis.    [provider]  escitalopram (LEXAPRO) 20 MG tablet Take 1  tablet (20 mg total) by mouth daily. 10/09/19   [name], MD  ezetimibe (ZETIA) 10 MG tablet Take 1 tablet (10 mg total) by mouth daily. Patient taking differently: Take 10 mg by mouth at bedtime.  10/14/19   Kroeger, Lorelee Cover., PA-C  folic acid (FOLVITE) 1 MG tablet Take 1 mg by mouth daily. 09/17/17   [provider]  KLOR-CON M20 20 MEQ tablet TAKE 1 TABLET (20 MEQ TOTAL) BY MOUTH 3 (THREE) TIMES DAILY. Patient taking differently: Take 20 mEq by mouth 2 (two) times daily.  02/15/20   [name], MD  l-methylfolate-B6-B12 (METANX) 3-35-2 MG TABS tablet TAKE 1 TABLET BY MOUTH 2 (TWO) TIMES DAILY. 01/10/20   Dohmeier, Asencion Partridge, MD  leflunomide (ARAVA) 20 MG tablet Take 20 mg by mouth daily.  07/10/18   [provider]  metoprolol succinate (TOPROL-XL) 50 MG 24 hr tablet Take 1 tablet (50 mg total) by mouth daily. Take with or immediately following a meal. 11/16/19   Angiulli, Lavon Paganini, PA-C  nitroGLYCERIN (NITROSTAT) 0.4 MG SL tablet Place 1 tablet (0.4 mg total) under the tongue every 5 (five) minutes as needed for chest pain. 12/24/17 10/24/20  [name], MD  ondansetron (ZOFRAN) 4 MG tablet Take 4 mg by mouth every 6 (six) hours as needed for nausea.    [provider]  oxyCODONE (OXY IR/ROXICODONE) 5 MG immediate release tablet Take 1-2 tablets (5-10 mg total) by mouth every 6 (six) hours as needed for moderate pain (pain score 4-6). 01/04/20   Mcarthur Rossetti, MD  prednisoLONE acetate (PRED FORTE) 1 % ophthalmic suspension Place 1 drop into both eyes in the morning and at bedtime.    [provider]  predniSONE (DELTASONE) 5 MG tablet Take 5 mg by mouth 2 (two) times daily.  11/02/16   [provider]  pregabalin (LYRICA) 200 MG capsule Take  1 capsule (200 mg total) by mouth 2 (two) times daily. 12/19/19   [name], MD  SAVAYSA 30 MG TABS tablet TAKE 1 TABLET (30 MG TOTAL) BY MOUTH DAILY. 11/26/19   [name], MD  silodosin  (RAPAFLO) 8 MG CAPS capsule Take 1 capsule (8 mg total) by mouth daily with breakfast. 01/09/20   McKenzie, Candee Furbish, MD  sulfaSALAzine (AZULFIDINE) 500 MG tablet Take 500 mg by mouth in the morning and at bedtime.  09/17/19   [provider]  tamsulosin (FLOMAX) 0.4 MG CAPS capsule TAKE 1 CAPSULE (0.4 MG TOTAL) BY MOUTH DAILY AFTER SUPPER. Patient not taking: Reported on 02/21/2020 02/14/20   Cleon Gustin, MD  torsemide (DEMADEX) 20 MG tablet Take 20 mg by mouth daily.    [provider]  valACYclovir (VALTREX) 500 MG tablet TAKE 1 TABLET BY MOUTH EVERY DAY Patient taking differently: Take 500 mg by mouth daily.  10/27/19   Campbell Riches, MD  SAVAYSA 30 MG TABS tablet TAKE 1 TABLET (30 MG TOTAL) BY MOUTH DAILY. Patient taking differently: Take 30 mg by mouth daily.  02/02/19   [name], MD  sulfamethoxazole-trimethoprim (BACTRIM DS) 800-160 MG tablet Take 1 tablet by mouth every 12 (twelve) hours. 03/21/19   [name], MD  torsemide (DEMADEX) 20 MG tablet TAKE 1 TABLET BY MOUTH EVERY DAY Patient taking differently: Take 20 mg by mouth daily.  05/11/19   [name], MD    Allergies    Golimumab, Orencia [abatacept], Other, Peanut-containing drug products, Morphine, Oxycodone-acetaminophen, and Promethazine hcl  Review of Systems   Review of Systems  Constitutional: Negative for chills and fever.  HENT: Negative.   Respiratory: Negative.   Cardiovascular: Negative.   Musculoskeletal: Positive for arthralgias and back pain. Negative for joint swelling and myalgias.  Neurological: Negative for weakness and numbness.    Physical Exam Updated Vital Signs BP 110/66 (BP Location: Right Arm)   Pulse 79   Temp 98.2 F (36.8 C) (Oral)   Resp 18   Ht 5\' 7"  (1.702 m)   Wt 70.3 kg   SpO2 99%   BMI 24.28 kg/m   Physical Exam Vitals and nursing note reviewed.  Constitutional:      Appearance: He is well-developed.  HENT:     Head: Normocephalic  and atraumatic.  Eyes:     Conjunctiva/sclera: Conjunctivae normal.  Cardiovascular:     Rate and Rhythm: Normal rate.     Pulses:          Dorsalis pedis pulses are 2+ on the right side and 2+ on the left side.  Pulmonary:     Effort: Pulmonary effort is normal.  Abdominal:     General: There is no distension.  Musculoskeletal:        General: Tenderness present. No deformity.     Cervical back: Normal range of motion.       Back:     Comments: ttp right lower sacral region without ecchymosis, edema, hematoma or obvious deformity.  Well healed midline lumbar incision. Left paralumbar palpable hardware screws.   Skin:    General: Skin is warm and dry.  Neurological:     Mental Status: He is alert.     Sensory: Sensation is intact.     Motor: Motor function is intact.     ED Results / Procedures / Treatments   Labs (all labs ordered are listed, but only abnormal results are displayed) Labs Reviewed -  No data to display  EKG None  Radiology DG Lumbar Spine Complete  Result Date: 03/05/2020 CLINICAL DATA:  Status post fall 1 week ago. EXAM: LUMBAR SPINE - COMPLETE 4+ VIEW COMPARISON:  December 28, 2018 FINDINGS: There is no evidence of an acute lumbar spine fracture. Chronic compression fracture deformities are seen involving the L1 and L2 vertebral bodies. There is moderate severity levoscoliosis. Multiple bilateral radiopaque pedicle screws are seen throughout the lower thoracic spine and lumbar spine. A total left hip replacement is seen. Radiopaque surgical clips are seen within the right upper quadrant. IMPRESSION: 1. Chronic compression fracture deformities involving the L1 and L2 vertebral bodies. 2. Extensive postoperative changes in the lumbar spine and lower thoracic spine. Electronically Signed   By: Virgina Norfolk M.D.   On: 03/05/2020 16:41   DG Sacrum/Coccyx  Result Date: 03/05/2020 CLINICAL DATA:  Status post fall. EXAM: SACRUM AND COCCYX - 2+ VIEW COMPARISON:   February 02, 2020 FINDINGS: An acute nondisplaced fracture deformity is seen extending through the lower sacrum, at the level of S5. There is no evidence of dislocation. Multiple bilateral radiopaque pedicle screws are seen within the lower lumbar spine. A total left hip replacement is noted. IMPRESSION: Acute, nondisplaced fracture deformity of the lower sacrum at the level of S5. Electronically Signed   By: Virgina Norfolk M.D.   On: 03/05/2020 16:43   DG HIPS BILAT WITH PELVIS MIN 5 VIEWS  Result Date: 03/05/2020 CLINICAL DATA:  Status post recent fall. EXAM: DG HIP (WITH OR WITHOUT PELVIS) 5+V BILAT COMPARISON:  January 12, 2020 FINDINGS: A total left hip replacement is seen. There is no evidence of surrounding lucency to suggest the presence of hardware loosening or infection. There is no evidence of acute hip fracture or dislocation. Mild degenerative changes seen involving the right hip, in the form of joint space narrowing and acetabular sclerosis. Multiple bilateral radiopaque pedicle screws are seen within the lower lumbar spine. IMPRESSION: 1. No acute findings. 2. Left hip replacement without evidence of hardware loosening or infection. Electronically Signed   By: Virgina Norfolk M.D.   On: 03/05/2020 16:39    Procedures Procedures (including critical care time)  Medications Ordered in ED Medications - No data to display  ED Course  I have reviewed the triage vital signs and the nursing notes.  Pertinent labs & imaging results that were available during my care of the patient were reviewed by me and considered in my medical decision making (see chart for details).    MDM Rules/Calculators/A&P                          Imaging reviewed and discussed with pt, pt was seen by Dr. Gilford Raid during this visit.  Discussed home tx, donut pillow for sitting (he has), avoid constipation.  He has hydrocodone - no further pain medicine wanted.  Plan f/u with ortho prn  Final Clinical  Impression(s) / ED Diagnoses Final diagnoses:  Closed fracture of sacrum, unspecified portion of sacrum, initial encounter [facility])    Rx / DC Orders ED Discharge Orders    None       Landis Martins 03/05/20 1719    Isla Pence, MD 03/05/20 [phone]

## 2020-03-06 ENCOUNTER — Encounter: Payer: Self-pay | Admitting: Orthopaedic Surgery

## 2020-03-07 ENCOUNTER — Other Ambulatory Visit: Payer: Self-pay | Admitting: Urology

## 2020-03-07 ENCOUNTER — Encounter: Payer: Self-pay | Admitting: Internal Medicine

## 2020-03-08 ENCOUNTER — Other Ambulatory Visit: Payer: Self-pay | Admitting: *Deleted

## 2020-03-08 ENCOUNTER — Encounter: Payer: Self-pay | Admitting: Internal Medicine

## 2020-03-08 MED ORDER — ESBRIET 267 MG PO TABS: 3.0000 | ORAL_TABLET | Freq: Three times a day (TID) | ORAL | 11 refills | Status: DC

## 2020-03-08 MED ORDER — ESBRIET 267 MG PO TABS: ORAL_TABLET | ORAL | 0 refills | Status: AC

## 2020-03-08 NOTE — Telephone Encounter (Signed)
Did you want this pt to have an ONO done?

## 2020-03-08 NOTE — Telephone Encounter (Signed)
According to my chart notes he has been on nighttime oxygen since summer 2021.  If this is not the case then we can order overnight on room air at night.  If this is the case please document and ask him how long he has been on nighttime oxygen and how many liters

## 2020-03-09 ENCOUNTER — Other Ambulatory Visit: Payer: Self-pay | Admitting: Internal Medicine

## 2020-03-12 ENCOUNTER — Ambulatory Visit: Payer: Medicare Other | Admitting: Internal Medicine

## 2020-03-12 ENCOUNTER — Encounter: Payer: Self-pay | Admitting: Internal Medicine

## 2020-03-12 DIAGNOSIS — Z0289 Encounter for other administrative examinations: Secondary | ICD-10-CM

## 2020-03-16 ENCOUNTER — Other Ambulatory Visit: Payer: Self-pay | Admitting: Infectious Diseases

## 2020-03-16 DIAGNOSIS — A6000 Herpesviral infection of urogenital system, unspecified: Secondary | ICD-10-CM

## 2020-03-21 ENCOUNTER — Encounter: Payer: Self-pay | Admitting: Internal Medicine

## 2020-03-21 ENCOUNTER — Ambulatory Visit (INDEPENDENT_AMBULATORY_CARE_PROVIDER_SITE_OTHER): Payer: Medicare Other | Admitting: Internal Medicine

## 2020-03-21 ENCOUNTER — Other Ambulatory Visit: Payer: Self-pay

## 2020-03-21 VITALS — BP 136/74 | HR 69 | Temp 98.1°F | Resp 16 | Ht 67.0 in | Wt 162.0 lb

## 2020-03-21 DIAGNOSIS — E5111 Dry beriberi: Secondary | ICD-10-CM

## 2020-03-21 DIAGNOSIS — T502X5A Adverse effect of carbonic-anhydrase inhibitors, benzothiadiazides and other diuretics, initial encounter: Secondary | ICD-10-CM

## 2020-03-21 DIAGNOSIS — D539 Nutritional anemia, unspecified: Secondary | ICD-10-CM | POA: Diagnosis not present

## 2020-03-21 DIAGNOSIS — B59 Pneumocystosis: Secondary | ICD-10-CM

## 2020-03-21 DIAGNOSIS — E876 Hypokalemia: Secondary | ICD-10-CM | POA: Diagnosis not present

## 2020-03-21 DIAGNOSIS — I2510 Atherosclerotic heart disease of native coronary artery without angina pectoris: Secondary | ICD-10-CM | POA: Diagnosis not present

## 2020-03-21 DIAGNOSIS — R1900 Intra-abdominal and pelvic swelling, mass and lump, unspecified site: Secondary | ICD-10-CM | POA: Diagnosis not present

## 2020-03-21 DIAGNOSIS — K610 Anal abscess: Secondary | ICD-10-CM

## 2020-03-21 LAB — CBC WITH DIFFERENTIAL/PLATELET
Basophils Absolute: 0 10*3/uL (ref 0.0–0.1)
Basophils Relative: 0.3 % (ref 0.0–3.0)
Eosinophils Absolute: 0 10*3/uL (ref 0.0–0.7)
Eosinophils Relative: 0.1 % (ref 0.0–5.0)
HCT: 39.3 % (ref 39.0–52.0)
Hemoglobin: 12.6 g/dL — ABNORMAL LOW (ref 13.0–17.0)
Lymphocytes Relative: 86.3 % — ABNORMAL HIGH (ref 12.0–46.0)
Lymphs Abs: 12.9 10*3/uL — ABNORMAL HIGH (ref 0.7–4.0)
MCHC: 32.1 g/dL (ref 30.0–36.0)
MCV: 106.7 fl — ABNORMAL HIGH (ref 78.0–100.0)
Monocytes Absolute: 0.5 10*3/uL (ref 0.1–1.0)
Monocytes Relative: 3.2 % (ref 3.0–12.0)
Neutro Abs: 1.5 10*3/uL (ref 1.4–7.7)
Neutrophils Relative %: 10.1 % — ABNORMAL LOW (ref 43.0–77.0)
Platelets: 116 10*3/uL — ABNORMAL LOW (ref 150.0–400.0)
RBC: 3.68 Mil/uL — ABNORMAL LOW (ref 4.22–5.81)
RDW: 17.7 % — ABNORMAL HIGH (ref 11.5–15.5)
WBC: 14.9 10*3/uL — ABNORMAL HIGH (ref 4.0–10.5)

## 2020-03-21 LAB — BASIC METABOLIC PANEL
BUN: 19 mg/dL (ref 6–23)
CO2: 35 mEq/L — ABNORMAL HIGH (ref 19–32)
Calcium: 8.3 mg/dL — ABNORMAL LOW (ref 8.4–10.5)
Chloride: 99 mEq/L (ref 96–112)
Creatinine, Ser: 0.68 mg/dL (ref 0.40–1.50)
GFR: 93.3 mL/min (ref >60.00)
Glucose, Bld: 94 mg/dL (ref 70–99)
Potassium: 3.6 mEq/L (ref 3.5–5.1)
Sodium: 140 mEq/L (ref 135–145)

## 2020-03-21 LAB — FOLATE: Folate: >23.6 ng/mL (ref >5.9)

## 2020-03-21 LAB — IRON: Iron: 101 ug/dL (ref 42–165)

## 2020-03-21 LAB — VITAMIN B12: Vitamin B-12: >1526 pg/mL — ABNORMAL HIGH (ref 211–911)

## 2020-03-21 LAB — FERRITIN: Ferritin: 101.8 ng/mL (ref 22.0–322.0)

## 2020-03-21 MED ORDER — SULFAMETHOXAZOLE-TRIMETHOPRIM 800-160 MG PO TABS: 1.0000 | ORAL_TABLET | Freq: Every day | ORAL | 1 refills | Status: DC

## 2020-03-21 MED ORDER — METRONIDAZOLE 250 MG PO TABS: 250.0000 mg | ORAL_TABLET | Freq: Three times a day (TID) | ORAL | 0 refills | Status: DC

## 2020-03-21 NOTE — Patient Instructions (Signed)
Anemia  Anemia is a condition in which you do not have enough red blood cells or hemoglobin. Hemoglobin is a substance in red blood cells that carries oxygen. When you do not have enough red blood cells or hemoglobin (are anemic), your body cannot get enough oxygen and your organs may not work properly. As a result, you may feel very tired or have other problems. What are the causes? Common causes of anemia include:  Excessive bleeding. Anemia can be caused by excessive bleeding inside or outside the body, including bleeding from the intestine or from periods in women.  Poor nutrition.  Long-lasting (chronic) kidney, thyroid, and liver disease.  Bone marrow disorders.  Cancer and treatments for cancer.  HIV (human immunodeficiency virus) and AIDS (acquired immunodeficiency syndrome).  Treatments for HIV and AIDS.  Spleen problems.  Blood disorders.  Infections, medicines, and autoimmune disorders that destroy red blood cells. What are the signs or symptoms? Symptoms of this condition include:  Minor weakness.  Dizziness.  Headache.  Feeling heartbeats that are irregular or faster than normal (palpitations).  Shortness of breath, especially with exercise.  Paleness.  Cold sensitivity.  Indigestion.  Nausea.  Difficulty sleeping.  Difficulty concentrating. Symptoms may occur suddenly or develop slowly. If your anemia is mild, you may not have symptoms. How is this diagnosed? This condition is diagnosed based on:  Blood tests.  Your medical history.  A physical exam.  Bone marrow biopsy. Your health care provider may also check your stool (feces) for blood and may do additional testing to look for the cause of your bleeding. You may also have other tests, including:  Imaging tests, such as a CT scan or MRI.  Endoscopy.  Colonoscopy. How is this treated? Treatment for this condition depends on the cause. If you continue to lose a lot of blood, you may  need to be treated at a hospital. Treatment may include:  Taking supplements of iron, vitamin S31, or folic acid.  Taking a hormone medicine (erythropoietin) that can help to stimulate red blood cell growth.  Having a blood transfusion. This may be needed if you lose a lot of blood.  Making changes to your diet.  Having surgery to remove your spleen. Follow these instructions at home:  Take over-the-counter and prescription medicines only as told by your health care provider.  Take supplements only as told by your health care provider.  Follow any diet instructions that you were given.  Keep all follow-up visits as told by your health care provider. This is important. Contact a health care provider if:  You develop new bleeding anywhere in the body. Get help right away if:  You are very weak.  You are short of breath.  You have pain in your abdomen or chest.  You are dizzy or feel faint.  You have trouble concentrating.  You have bloody or black, tarry stools.  You vomit repeatedly or you vomit up blood. Summary  Anemia is a condition in which you do not have enough red blood cells or enough of a substance in your red blood cells that carries oxygen (hemoglobin).  Symptoms may occur suddenly or develop slowly.  If your anemia is mild, you may not have symptoms.  This condition is diagnosed with blood tests as well as a medical history and physical exam. Other tests may be needed.  Treatment for this condition depends on the cause of the anemia. This information is not intended to replace advice given to you by  your health care provider. Make sure you discuss any questions you have with your health care provider. Document Revised: 04/10/2017 Document Reviewed: 05/30/2016 Elsevier Patient Education  [place].

## 2020-03-21 NOTE — Progress Notes (Addendum)
Subjective:  Patient ID: [name], male    DOB: August 03, 1950  Age: 69 y.o. MRN: [id number]  CC: Follow-up  This visit occurred during the SARS-CoV-2 public health emergency.  Safety protocols were in place, including screening questions prior to the visit, additional usage of staff PPE, and extensive cleaning of exam room while observing appropriate contact time as indicated for disinfecting solutions.    HPI [name] presents for f/up - He complains of a 2-week history of rectal discomfort and tenesmus.  He has also noted some nodules in the perineum bilaterally.  He is taking ampicillin and Bactrim DS for other infections.  He denies nausea, vomiting, fever, or chills.  He was recently diagnosed with a sacral fracture.  He is taking opiates to control the pain.  Outpatient Medications Prior to Visit  Medication Sig Dispense Refill  . acetaminophen (TYLENOL) 500 MG tablet Take 500-1,000 mg by mouth every 6 (six) hours as needed for mild pain or moderate pain.     [name] alfuzosin (UROXATRAL) 10 MG 24 hr tablet Take 1 tablet (10 mg total) by mouth daily with breakfast. 30 tablet 11  . atorvastatin (LIPITOR) 10 MG tablet Take 1 tablet (10 mg total) by mouth at bedtime. 30 tablet 9  . bictegravir-emtricitabine-tenofovir AF (BIKTARVY) 50-200-25 MG TABS tablet Take 1 tablet by mouth daily.     . darunavir-cobicistat (PREZCOBIX) 800-150 MG tablet Take 1 tablet by mouth daily with breakfast.     . docusate sodium (COLACE) 100 MG capsule Take 1 capsule (100 mg total) by mouth 2 (two) times daily. 10 capsule 0  . EPINEPHrine (EPIPEN 2-PAK) 0.3 mg/0.3 mL IJ SOAJ injection Inject 0.3 mg into the muscle as needed for anaphylaxis.    . ESBRIET 267 MG TABS Take 1 tablet (267 mg total) by mouth in the morning, at noon, and at bedtime for 7 days, THEN 2 tablets (534 mg total) in the morning, at noon, and at bedtime for 7 days, THEN 3 tablets (801 mg total) in the morning, at noon, and at bedtime for 7  days. 207 tablet 0  . ESBRIET 267 MG TABS TAKE 1 TABLET BY MOUTH 3 TIMES A DAY FOR 7 DAYS, THEN 2 TABLETS BY MOUTH 3 TIMES A DAY FOR 7 DAYS, THEN 3 TABLETS BY MOUTH 3 TIMES A DAY THEREAFTER 207 tablet 1  . escitalopram (LEXAPRO) 20 MG tablet Take 1 tablet (20 mg total) by mouth daily. 90 tablet 1  . ezetimibe (ZETIA) 10 MG tablet Take 1 tablet (10 mg total) by mouth daily. (Patient taking differently: Take 10 mg by mouth at bedtime. ) 90 tablet 3  . folic acid (FOLVITE) 1 MG tablet Take 1 mg by mouth daily.  11  . KLOR-CON M20 20 MEQ tablet TAKE 1 TABLET (20 MEQ TOTAL) BY MOUTH 3 (THREE) TIMES DAILY. (Patient taking differently: Take 20 mEq by mouth 2 (two) times daily. ) 270 tablet 0  . l-methylfolate-B6-B12 (METANX) 3-35-2 MG TABS tablet TAKE 1 TABLET BY MOUTH 2 (TWO) TIMES DAILY. 180 tablet 1  . leflunomide (ARAVA) 20 MG tablet Take 20 mg by mouth daily.     . metoprolol succinate (TOPROL-XL) 50 MG 24 hr tablet Take 1 tablet (50 mg total) by mouth daily. Take with or immediately following a meal. 30 tablet 0  . nitroGLYCERIN (NITROSTAT) 0.4 MG SL tablet Place 1 tablet (0.4 mg total) under the tongue every 5 (five) minutes as needed for chest pain. 25 tablet 2  .  ondansetron (ZOFRAN) 4 MG tablet Take 4 mg by mouth every 6 (six) hours as needed for nausea.    [name] oxyCODONE (OXY IR/ROXICODONE) 5 MG immediate release tablet Take 1-2 tablets (5-10 mg total) by mouth every 6 (six) hours as needed for moderate pain (pain score 4-6). 30 tablet 0  . Pirfenidone (ESBRIET) 267 MG TABS Take 3 tablets (801 mg total) by mouth in the morning, at noon, and at bedtime. 270 tablet 11  . prednisoLONE acetate (PRED FORTE) 1 % ophthalmic suspension Place 1 drop into both eyes in the morning and at bedtime.    . predniSONE (DELTASONE) 5 MG tablet Take 5 mg by mouth 2 (two) times daily.   1  . pregabalin (LYRICA) 200 MG capsule Take 1 capsule (200 mg total) by mouth 2 (two) times daily. 60 capsule 5  . SAVAYSA 30 MG TABS  tablet TAKE 1 TABLET (30 MG TOTAL) BY MOUTH DAILY. 30 tablet 5  . silodosin (RAPAFLO) 8 MG CAPS capsule Take 1 capsule (8 mg total) by mouth daily with breakfast. 30 capsule 3  . sulfaSALAzine (AZULFIDINE) 500 MG tablet Take 500 mg by mouth in the morning and at bedtime.     . tamsulosin (FLOMAX) 0.4 MG CAPS capsule TAKE 1 CAPSULE (0.4 MG TOTAL) BY MOUTH DAILY AFTER SUPPER. 30 capsule 1  . torsemide (DEMADEX) 20 MG tablet Take 20 mg by mouth daily.    . valACYclovir (VALTREX) 500 MG tablet TAKE 1 TABLET BY MOUTH EVERY DAY 30 tablet 4   No facility-administered medications prior to visit.    ROS Review of Systems  Constitutional: Positive for unexpected weight change (wt gain). Negative for chills, diaphoresis, fatigue and fever.  HENT: Negative.  Negative for trouble swallowing.   Eyes: Negative.   Respiratory: Negative for cough, chest tightness, shortness of breath and wheezing.   Cardiovascular: Negative for chest pain, palpitations and leg swelling.  Gastrointestinal: Positive for rectal pain. Negative for abdominal pain, anal bleeding, blood in stool, constipation, diarrhea, nausea and vomiting.  Endocrine: Negative.   Genitourinary: Negative.  Negative for decreased urine volume, difficulty urinating, discharge, dysuria, flank pain, genital sores, hematuria, scrotal swelling, testicular pain and urgency.  Musculoskeletal: Negative.   Skin: Negative.  Negative for rash.  Neurological: Negative.  Negative for dizziness and weakness.  Hematological: Negative.  Negative for adenopathy. Does not bruise/bleed easily.  Psychiatric/Behavioral: Negative.     Objective:  BP 136/74   Pulse 69   Temp 98.1 F (36.7 C) (Oral)   Resp 16   Ht 5\' 7"  (1.702 m)   Wt 162 lb (73.5 kg)   SpO2 93%   BMI 25.37 kg/m   BP Readings from Last 3 Encounters:  03/21/20 136/74  03/05/20 (!) 114/58  02/27/20 (!) 142/72    Wt Readings from Last 3 Encounters:  03/21/20 162 lb (73.5 kg)  03/05/20  155 lb (70.3 kg)  02/27/20 155 lb (70.3 kg)    Physical Exam Vitals reviewed.  Constitutional:      General: He is not in acute distress.    Appearance: He is ill-appearing. He is not toxic-appearing or diaphoretic.  HENT:     Nose: Nose normal.     Mouth/Throat:     Mouth: Mucous membranes are moist.  Eyes:     General: No scleral icterus.    Conjunctiva/sclera: Conjunctivae normal.  Cardiovascular:     Rate and Rhythm: Normal rate and regular rhythm.     Heart sounds: No murmur heard.  Pulmonary:     Effort: Pulmonary effort is normal.     Breath sounds: No stridor. No wheezing, rhonchi or rales.  Abdominal:     General: Abdomen is flat. Bowel sounds are normal. There is no distension.     Palpations: Abdomen is soft. There is no hepatomegaly, splenomegaly or mass.     Tenderness: There is no abdominal tenderness.     Hernia: There is no hernia in the left inguinal area or right inguinal area.  Genitourinary:    Pubic Area: No rash.      Penis: Normal and circumcised.      Testes:        Right: Mass present. Tenderness or swelling not present.        Left: Mass present. Tenderness or swelling not present.     Epididymis:     Right: Normal. Not inflamed or enlarged. No mass or tenderness.     Left: Not inflamed or enlarged. No mass or tenderness.     Prostate: Not enlarged, not tender and no nodules present.     Rectum: Guaiac result negative. Tenderness and internal hemorrhoid present. No mass, anal fissure or external hemorrhoid. Normal anal tone.       Comments: There is tenderness and fullness in the perirectal region bilaterally. Musculoskeletal:     Cervical back: Neck supple.  Lymphadenopathy:     Cervical: No cervical adenopathy.     Lower Body: No right inguinal adenopathy. Left inguinal adenopathy present.  Neurological:     Mental Status: He is alert.     Lab Results  Component Value Date   WBC 14.9 (H) 03/21/2020   HGB 12.6 (L) 03/21/2020   HCT  39.3 03/21/2020   PLT 116.0 (L) 03/21/2020   GLUCOSE 94 03/21/2020   CHOL 214 (H) 08/04/2019   TRIG 180 (H) 08/04/2019   HDL 40 08/04/2019   LDLCALC 142 (H) 08/04/2019   ALT 12 02/16/2020   AST 19 02/16/2020   NA 140 03/21/2020   K 3.6 03/21/2020   CL 99 03/21/2020   CREATININE 0.68 03/21/2020   BUN 19 03/21/2020   CO2 35 (H) 03/21/2020   TSH 1.12 12/08/2019   PSA 1.2 09/21/2019   INR 1.4 (H) 02/16/2020   HGBA1C 4.4 (L) 09/15/2019   MICROALBUR 2.6 (H) 11/16/2018    DG Lumbar Spine Complete  Result Date: 03/05/2020 CLINICAL DATA:  Status post fall 1 week ago. EXAM: LUMBAR SPINE - COMPLETE 4+ VIEW COMPARISON:  December 28, 2018 FINDINGS: There is no evidence of an acute lumbar spine fracture. Chronic compression fracture deformities are seen involving the L1 and L2 vertebral bodies. There is moderate severity levoscoliosis. Multiple bilateral radiopaque pedicle screws are seen throughout the lower thoracic spine and lumbar spine. A total left hip replacement is seen. Radiopaque surgical clips are seen within the right upper quadrant. IMPRESSION: 1. Chronic compression fracture deformities involving the L1 and L2 vertebral bodies. 2. Extensive postoperative changes in the lumbar spine and lower thoracic spine. Electronically Signed   By: Virgina Norfolk M.D.   On: 03/05/2020 16:41   DG Sacrum/Coccyx  Result Date: 03/05/2020 CLINICAL DATA:  Status post fall. EXAM: SACRUM AND COCCYX - 2+ VIEW COMPARISON:  February 02, 2020 FINDINGS: An acute nondisplaced fracture deformity is seen extending through the lower sacrum, at the level of S5. There is no evidence of dislocation. Multiple bilateral radiopaque pedicle screws are seen within the lower lumbar spine. A total left hip replacement is noted. IMPRESSION:  Acute, nondisplaced fracture deformity of the lower sacrum at the level of S5. Electronically Signed   By: Virgina Norfolk M.D.   On: 03/05/2020 16:43   DG HIPS BILAT WITH PELVIS MIN 5  VIEWS  Result Date: 03/05/2020 CLINICAL DATA:  Status post recent fall. EXAM: DG HIP (WITH OR WITHOUT PELVIS) 5+V BILAT COMPARISON:  January 12, 2020 FINDINGS: A total left hip replacement is seen. There is no evidence of surrounding lucency to suggest the presence of hardware loosening or infection. There is no evidence of acute hip fracture or dislocation. Mild degenerative changes seen involving the right hip, in the form of joint space narrowing and acetabular sclerosis. Multiple bilateral radiopaque pedicle screws are seen within the lower lumbar spine. IMPRESSION: 1. No acute findings. 2. Left hip replacement without evidence of hardware loosening or infection. Electronically Signed   By: Virgina Norfolk M.D.   On: 03/05/2020 16:39    Assessment & Plan:   Ziah was seen today for follow-up.  Diagnoses and all orders for this visit:  Perianal abscess-he is already taking ampicillin and Bactrim DS.  Will offer additional coverage of anaerobes with metronidazole.  I have asked him to undergo an MRI with and without contrast to see if there is a abscess that may need to be drained. -     MR Pelvis W Wo Contrast; Future -     CBC with Differential/Platelet; Future -     metroNIDAZOLE (FLAGYL) 250 MG tablet; Take 1 tablet (250 mg total) by mouth 3 (three) times daily for 7 days. -     CBC with Differential/Platelet  Pelvic mass in male-I recommended that he undergo an MRI with and without contrast to screen for malignancy and infection. -     MR Pelvis W Wo Contrast; Future  Pneumonia of both upper lobes due to Pneumocystis jirovecii ([place])- Will continue sulfamethoxazole/trimethoprim to suppress the infection. -     sulfamethoxazole-trimethoprim (BACTRIM DS) 800-160 MG tablet; Take 1 tablet by mouth daily.  Deficiency anemia- I will recheck his H&H and will screen him for vitamin deficiencies. -     CBC with Differential/Platelet; Future -     Vitamin B12; Future -     Folate;  Future -     Ferritin; Future -     Iron; Future -     Iron -     Ferritin -     Folate -     Vitamin B12 -     CBC with Differential/Platelet  Thiamine deficiency neuropathy -     CBC with Differential/Platelet; Future -     Vitamin B1; Future -     Vitamin B1 -     CBC with Differential/Platelet  Diuretic-induced hypokalemia -     Basic metabolic panel; Future -     Basic metabolic panel   I have changed Paarth L. Kurek "Phil"'s sulfamethoxazole-trimethoprim. I am also having him start on metroNIDAZOLE. Additionally, I am having him maintain his predniSONE, folic acid, nitroGLYCERIN, leflunomide, acetaminophen, EPINEPHrine, ondansetron, sulfaSALAzine, Biktarvy, Prezcobix, escitalopram, ezetimibe, atorvastatin, docusate sodium, metoprolol succinate, Savaysa, alfuzosin, pregabalin, oxyCODONE, l-methylfolate-B6-B12, silodosin, torsemide, Klor-Con M20, prednisoLONE acetate, tamsulosin, Esbriet, Esbriet, Esbriet, and valACYclovir.  Meds ordered this encounter  Medications  . sulfamethoxazole-trimethoprim (BACTRIM DS) 800-160 MG tablet    Sig: Take 1 tablet by mouth daily.    Dispense:  90 tablet    Refill:  1  . metroNIDAZOLE (FLAGYL) 250 MG tablet    Sig: Take 1 tablet (250  mg total) by mouth 3 (three) times daily for 7 days.    Dispense:  21 tablet    Refill:  0   I spent 50 minutes in preparing to see the patient by review of recent labs, imaging and procedures, obtaining and reviewing separately obtained history, communicating with the patient and family or caregiver, ordering medications, tests or procedures, and documenting clinical information in the EHR including the differential Dx, treatment, and any further evaluation and other management of 1. Perianal abscess 2. Pelvic mass in male 3. Pneumonia of both upper lobes due to Pneumocystis jirovecii ([place]) 4. Deficiency anemia 5. Thiamine deficiency neuropathy 6. Diuretic-induced hypokalemia     Follow-up: Return in  about 3 weeks (around 04/11/2020).  [name], MD

## 2020-03-22 ENCOUNTER — Other Ambulatory Visit: Payer: Self-pay | Admitting: Internal Medicine

## 2020-03-22 ENCOUNTER — Ambulatory Visit
Admission: RE | Admit: 2020-03-22 | Discharge: 2020-03-22 | Disposition: A | Discharge status: Home / Self Care | Payer: Medicare Other | Source: Ambulatory Visit | Attending: Internal Medicine | Admitting: Internal Medicine

## 2020-03-22 ENCOUNTER — Encounter: Payer: Self-pay | Admitting: Internal Medicine

## 2020-03-22 DIAGNOSIS — K610 Anal abscess: Secondary | ICD-10-CM

## 2020-03-22 DIAGNOSIS — R1900 Intra-abdominal and pelvic swelling, mass and lump, unspecified site: Secondary | ICD-10-CM

## 2020-03-22 DIAGNOSIS — K6289 Other specified diseases of anus and rectum: Secondary | ICD-10-CM | POA: Diagnosis not present

## 2020-03-22 DIAGNOSIS — R600 Localized edema: Secondary | ICD-10-CM | POA: Diagnosis not present

## 2020-03-22 DIAGNOSIS — N400 Enlarged prostate without lower urinary tract symptoms: Secondary | ICD-10-CM | POA: Diagnosis not present

## 2020-03-22 DIAGNOSIS — S3219XA Other fracture of sacrum, initial encounter for closed fracture: Secondary | ICD-10-CM | POA: Diagnosis not present

## 2020-03-22 MED ORDER — GADOBENATE DIMEGLUMINE 529 MG/ML IV SOLN
15.0000 mL | Freq: Once | INTRAVENOUS | Status: AC | PRN
  Administered 2020-03-22: 15 mL via INTRAVENOUS

## 2020-03-26 ENCOUNTER — Other Ambulatory Visit: Payer: Self-pay | Admitting: Internal Medicine

## 2020-03-26 DIAGNOSIS — Z6825 Body mass index (BMI) 25.0-25.9, adult: Secondary | ICD-10-CM | POA: Diagnosis not present

## 2020-03-26 DIAGNOSIS — M1A09X0 Idiopathic chronic gout, multiple sites, without tophus (tophi): Secondary | ICD-10-CM | POA: Diagnosis not present

## 2020-03-26 DIAGNOSIS — B59 Pneumocystosis: Secondary | ICD-10-CM | POA: Diagnosis not present

## 2020-03-26 DIAGNOSIS — M150 Primary generalized (osteo)arthritis: Secondary | ICD-10-CM | POA: Diagnosis not present

## 2020-03-26 DIAGNOSIS — C9110 Chronic lymphocytic leukemia of B-cell type not having achieved remission: Secondary | ICD-10-CM | POA: Diagnosis not present

## 2020-03-26 DIAGNOSIS — M0609 Rheumatoid arthritis without rheumatoid factor, multiple sites: Secondary | ICD-10-CM | POA: Diagnosis not present

## 2020-03-26 DIAGNOSIS — E519 Thiamine deficiency, unspecified: Secondary | ICD-10-CM

## 2020-03-26 DIAGNOSIS — R29818 Other symptoms and signs involving the nervous system: Secondary | ICD-10-CM | POA: Diagnosis not present

## 2020-03-26 DIAGNOSIS — Z21 Asymptomatic human immunodeficiency virus [HIV] infection status: Secondary | ICD-10-CM | POA: Diagnosis not present

## 2020-03-26 DIAGNOSIS — M2550 Pain in unspecified joint: Secondary | ICD-10-CM | POA: Diagnosis not present

## 2020-03-26 DIAGNOSIS — E663 Overweight: Secondary | ICD-10-CM | POA: Diagnosis not present

## 2020-03-26 DIAGNOSIS — J849 Interstitial pulmonary disease, unspecified: Secondary | ICD-10-CM | POA: Diagnosis not present

## 2020-03-26 LAB — VITAMIN B1: Vitamin B1 (Thiamine): <6 nmol/L — ABNORMAL LOW (ref 8–30)

## 2020-03-26 MED ORDER — THIAMINE HCL 100 MG PO TABS: 100.0000 mg | ORAL_TABLET | ORAL | 1 refills | Status: DC

## 2020-03-27 DIAGNOSIS — H16223 Keratoconjunctivitis sicca, not specified as Sjogren's, bilateral: Secondary | ICD-10-CM | POA: Diagnosis not present

## 2020-03-28 ENCOUNTER — Encounter: Payer: Self-pay | Admitting: Urology

## 2020-03-29 LAB — FUNGUS CULTURE WITH STAIN

## 2020-03-29 LAB — FUNGAL ORGANISM REFLEX

## 2020-03-29 LAB — FUNGUS CULTURE RESULT

## 2020-03-29 NOTE — Op Note (Signed)
Name:  [name] MRN:  [id number] DOB:  August 01, 1950  PROCEDURE NOTE  Procedure(s): Flexible bronchoscopy [phone]) Bronchial alveolar lavage [phone]) of the Right lower lobe   Indications:  ILD, immuonsuppressed  Consent:  Procedure, benefits, risks and alternatives discussed.  Questions answered.  Consent obtained.  Anesthesia:  Per anesthsia team  Location:[place] . Procedure done 02/27/20. Note being completed late on 03/29/20  Procedure summary:  Appropriate equipment was assembled.  The patient was brought to the procedure suite room and identified as [name] with 1951/03/12  Safety timeout was performed. The patient was placed supine on the  table,    After the appropriate level of anesthesia was assured, flexible video bronchoscope was lubricated and inserted through the 1% Lidocaine were administered through the bronchoscope to augment moderate sedation  Airway examination was yes performed bilaterally to subsegmental level.  Minimal clear secretions were noted, mucosa appeared normal and no  endobronchial lesions were identified.  Bronchial alveolar lavage of the RLL was  was performed  After hemostasis was assure, the bronchoscope was withdrawn.  The patient was recovered and then  transferred to recovery area  Post-procedure chest x-ray was no ordered.  Specimens sent: Bronchial alveolar lavage specimen of the  RLL  for cell count, microbiology and cytology.  Complications:  No immediate complications were noted.  Hemodynamic parameters and oxygenation remained stable throughout the procedure.  Estimated blood loss:  none  IMPRESSION 1. Normal airway 2. RLL - BAL 3. Clabe Seal classifer sent -  NO  Dr. Brand Males, M.D., [facility].C.P Pulmonary and Critical Care Medicine Staff Physician [facility] Pager: [phone], If no answer or between  15:00h - 7:00h: call [phone]  03/29/2020 8:41  AM

## 2020-04-02 ENCOUNTER — Ambulatory Visit (INDEPENDENT_AMBULATORY_CARE_PROVIDER_SITE_OTHER): Payer: Medicare Other | Admitting: Internal Medicine

## 2020-04-02 ENCOUNTER — Encounter: Payer: Self-pay | Admitting: Internal Medicine

## 2020-04-02 ENCOUNTER — Other Ambulatory Visit: Payer: Self-pay

## 2020-04-02 ENCOUNTER — Other Ambulatory Visit: Payer: Self-pay | Admitting: Internal Medicine

## 2020-04-02 VITALS — BP 104/60 | HR 84 | Ht 67.0 in | Wt 162.8 lb

## 2020-04-02 DIAGNOSIS — D849 Immunodeficiency, unspecified: Secondary | ICD-10-CM | POA: Diagnosis not present

## 2020-04-02 DIAGNOSIS — F418 Other specified anxiety disorders: Secondary | ICD-10-CM

## 2020-04-02 DIAGNOSIS — I2510 Atherosclerotic heart disease of native coronary artery without angina pectoris: Secondary | ICD-10-CM

## 2020-04-02 DIAGNOSIS — Z79899 Other long term (current) drug therapy: Secondary | ICD-10-CM

## 2020-04-02 DIAGNOSIS — M359 Systemic involvement of connective tissue, unspecified: Secondary | ICD-10-CM | POA: Diagnosis not present

## 2020-04-02 DIAGNOSIS — Z5181 Encounter for therapeutic drug level monitoring: Secondary | ICD-10-CM | POA: Diagnosis not present

## 2020-04-02 DIAGNOSIS — B356 Tinea cruris: Secondary | ICD-10-CM | POA: Diagnosis not present

## 2020-04-02 DIAGNOSIS — J8489 Other specified interstitial pulmonary diseases: Secondary | ICD-10-CM

## 2020-04-02 LAB — HEPATIC FUNCTION PANEL
ALT: 18 U/L (ref 0–53)
AST: 27 U/L (ref 0–37)
Albumin: 3.9 g/dL (ref 3.5–5.2)
Alkaline Phosphatase: 91 U/L (ref 39–117)
Bilirubin, Direct: 0.1 mg/dL (ref 0.0–0.3)
Total Bilirubin: 0.7 mg/dL (ref 0.2–1.2)
Total Protein: 5.9 g/dL — ABNORMAL LOW (ref 6.0–8.3)

## 2020-04-02 LAB — SARS-COV-2 IGG: SARS-COV-2 IgG: 0.02

## 2020-04-02 MED ORDER — ESBRIET 801 MG PO TABS: 1.0000 | ORAL_TABLET | Freq: Three times a day (TID) | ORAL | 11 refills | Status: AC

## 2020-04-02 NOTE — Addendum Note (Signed)
Addended by: Lorretta Harp on: 04/02/2020 10:51 AM   Modules accepted: Orders

## 2020-04-02 NOTE — Addendum Note (Signed)
Addended by: Suzzanne Cloud E on: 04/02/2020 10:49 AM   Modules accepted: Orders

## 2020-04-02 NOTE — Addendum Note (Signed)
Addended by: Lorretta Harp on: 04/02/2020 10:48 AM   Modules accepted: Orders

## 2020-04-02 NOTE — Patient Instructions (Addendum)
  You have slowly progressive interstitial lung disease between 2017 and 2021; Within 2021 it seems stable  Glad you are tolerating esbriet well - started late sept 2021/early oct 2021  Most likely this is due to rheumatoid arthritis causing progressive pulmonary fibrosis over time  Opportunistic infections in the setting of immunosuppressed status hs been rule out with bronch  02/27/20  Being immunosuppressed you might not have mounted a full antibody response to you to Covid vaccine despite booster 02/29/20  New right groin skin infection - Is not esbriet related. I think is called TINEA CRURIS    Plan -- Do Quantiferon gold (last 2018 was negative) -Get Covid IgG blood work to see response 4 weeks after booster - -Glad you are tolerating t pirfenidone  Well  - check LFT 04/02/2020 and again in in 4 weeks  - change to 1 big till three times daily - do spirometyr and dlco in 8-12  weeks   - start 1% clotrimazole ointment twice daily for 2 weeks  - if does not resolve call PCP or HIV doctor   FOLLOWUP  - 8-12  WEEKS WITH Dr Chase Caller in 30 min slot  -  -  ILD symptom score and assess eligibility for ILD-pro registry study at next visit

## 2020-04-02 NOTE — Addendum Note (Signed)
Addended by: Lorretta Harp on: 04/02/2020 10:59 AM   Modules accepted: Orders

## 2020-04-02 NOTE — Progress Notes (Signed)
69 yo never smoker with RA followed by [name]  Has HIV and CLL .    Admit date: 06/18/2015 Discharge date: 06/20/2015  Discharge Diagnoses:  Principal Problem:  Community acquired pneumonia Active Problems:  CAD (coronary artery disease) of artery bypass graft  Type II diabetes mellitus with manifestations (HCC)  HIV disease ([place])  Chronic lymphoblastic leukemia ([place])  Hypertension  DVT, lower extremity, recurrent ([place])  Pneumonia  Dyspnea   06/29/2015  f/u ov/Wert re: NSIP presumably due to RA lung dz/ poor control of arthritis chronically / s/p ? CAP  Chief Complaint  Patient presents with  . Follow-up    PFT done today. Pt hospitalized from 06/18/15-06/20/15 with PNA. He feels his breathing has improved since d/c. Cough is prod wit minimal, thick, dark yellow sputum.    mucous now has turned clear p finished abx /min volume in ams  / activity tol back near baseline s resp meds   No obvious day to day or daytime variability or assoc cp or chest tightness, subjective wheeze or overt sinus or hb symptoms. No unusual exp hx or h/o childhood pna/ asthma or knowledge of premature birth.  Sleeping ok without nocturnal  or early am exacerbation  of respiratory  c/o's or need for noct saba. Also denies any obvious fluctuation of symptoms with weather or environmental changes or other aggravating or alleviating factors except as outlined above     OV 09/13/2015  Chief Complaint  Patient presents with  . Follow-up    Pt changing providers from MW to MR for pulmonary fibrosis. Pt c/o DOE. Pt denies cough, CP/tightness.   Is a transfer of care from Dr. Legrand Como wert to Dr. Fuller Plan  69 year old male immunosuppressed on basis of HIV and rheumatoid arthritis. Given necrosis factor Alpha blockade and Imuran. He appears to have CT findings of interstitial lung disease starting in September 2016 and progressing all the way through end of February 2017. A 2015 CT  chest did not show any interstitial lung disease  He reports that December 2016 he reported to pulmonary office for worsening shortness of breath. Noticed to have interstitial lung disease changes. Clinical diagnosis that interstitial lung disease was related to rheumatoid arthritis was made [his HIV viral load was low and CD4 counts were normal all the way since 2010]. This point in time he had been on infliximab for 3 years. A decision was made to add Imuran to the regimen. At this point in time he was not on Bactrim. Subsequently he got sick early every 2017 and was admitted to the hospital with a clinical diagnosis of community acquired pneumonia and treated with antibiotics and discharged. Subsequently readmitted end of February 2017 for 10 days with hypoxemic respiratory failure. Treated with broad-spectrum antibiotics but did not improve. Finally started on Bactrim for presumed Pneumocystis carinii pneumonia according to the patient and then began to improve. This was when he had last CT scan of the chest which comparatively shows progressive ILD changes.  At this point in time he is on immunosuppressive medications. Most recently saw Dr. Riley Churches and infectious diseases his CD4 count has dropped for the first time and he believes this is due to stress. He is compliant with this anti-retroviral therapy Genovaya. He is also on anticoagulation. Overall he is feeling much better physically stronger and less short of breath. Although he still does have shortness of breath with exertion infection in the office he did desaturate.  Walking desat test 185 feet x 3 laps on RA -> desat to 87% at 2nd lap and got dyspneic and wanted to stop  10/25/2015 Follow up: ILD  Patient returns for a 6 week follow-up for ILD. Patient was recently seen by Dr. Chase Caller for worsening shortness of breath since December 2016. He was noticed to have ILD changes on CT chest. Diagnosis with interstitial lung disease  related to rheumatoid arthritis was made. He does have HIV with CD4 counts normal all the way back since 2010. He was started on Imuran at that time. Patient was admitted earlier this year with pneumonia and readmitted in February 2017 for acute hypoxic respiratory failure. He chest in February 2017 showed progressive patchy opacities in both lungs. Repeat CT chest 09/27/2015 showed a significant decrease in groundglass attenuation bilaterally. Fibrotic changes are considered stable since February 2017 but increased since January 2015 Patient returns today after undergoing a bronchoscopy on 10/16/2015. Ultracet been negative to date. Cytology showed benign reactive changes. Patient says overall he is feeling okay. He continues to use oxygen with activity and at bedtime. He denies any flare cough or dyspnea.  Has been having more reflux . Has intermittent dysphagia .  Chokes easily . Previous hx of esophageal surgery .    OV 01/24/2016  Chief Complaint  Patient presents with  . Follow-up    3 month ILD follow up & PFT review - reports breathing is at baseline.  was in [facility] July 2017 for PNA     follow-up interstitial lung disease in te setting of rheumatoid arthriti on Imuran and HIV disease with  Normal CD4 count nd antiretroviratherapy.   he is on HARRT and TNF alpha blockade treatment aocumented below. Overall snce last isit he is doing wll. Dyspnea is improved. In the interim approximately June 2017ot admitted at [facility] of "fluid in lungs" .  Now back tbaeline  PFT is normal but for low dlco that is moderate 65%. This is improved from early 2017 and similar to year ago. CT chest today just mild basal ILD - NSIP pattern - personally visuzalied    OV 10/13/2019 -patient last seen by me in 2017 September.  Is been over 3 years there for this new visit.  Subjective:  Patient ID: [name], male , DOB: [birth year] , age 59 y.o. , MRN: [id number] , ADDRESS: [id number]   10/13/2019 -   Chief Complaint  Patient presents with  . Consult    Doe for years   Immunosuppressed on basis of HIV and rheumatoid arthritis. Given necrosis factor Alpha blockade and Imuran. He appears to have CT findings of interstitial lung disease starting in September 2016 and progressing all the way through end of February 2017. A 2015 CT chest did not show any interstitial lung disease  June 2017 BAL: Eosinophils 10% and lymphocytes 55%.   Normal CD4 count nd antiretroviratherapy.  HPI [name] 69 y.o. -personally seen over 3 years ago.  After that not seen and not followed up.  He says in the interim his HIV continues to be under control with good CD4 counts.  He is compliant with his antiretroviral therapy.  He is no longer on oxygen though for his interstitial lung disease.  His 2017 BAL showed lymphocytes and eosinophils.  Negative for cultures.  He says slow and steadily his dyspnea is gotten worse.  However when we walked in he is only able to walk  half a lap and stop because of hip pain and dyspnea but did not desaturate but got very tachycardic.  He says that his left hip has bothered him probably from a combination of osteoarthritis and rheumatoid arthritis.  He has had hip replacement but now the plastic socket is loose and needs to be repaired.  He is here for preoperative clearance.  He says he is in significant amount of pain.  He wants a surgery done soon.  His recent renal function and nutritional status is adequate.  His hemoglobin recently is also adequate no recent respiratory infections.  His pulse ox on room air at rest is fine.  His last CT scan of the chest and pulmonary function test was all several years ago and is documented below.  He is agreeable to have this restaged.  I explained to him the new antifibrotic indications and non-IPF progressive ILD.  However he wants to have his hip surgery first and wants a clearance.    Simple  office walk 185 feet x  3 laps goal with forehead probe 10/13/2019   O2 used ra  Number laps completed 3 attempted by did only 1 and stopped due to hip pain  Comments about pace slow  Resting Pulse Ox/HR 95% and 84/min  Final Pulse Ox/HR 95% and 112/min  Desaturated </= 88% no  Desaturated <= 3% points no  Got Tachycardic >/= 90/min yes  Symptoms at end of test dspnea nad hip pain  Miscellaneous comments x       Results for Moxley, Kealii [name] "PHIL" (MRN [id number]) as of 10/13/2019 10:00  Ref. Range 01/24/2016 08:53  FVC-Pre Latest Units: [name] 3.46  FVC-%Pred-Pre Latest Units: % 93  FEV1-Pre Latest Units: [name] 2.68  FEV1-%Pred-Pre Latest Units: % 98  Pre FEV1/FVC ratio Latest Units: % 78  Results for [name], [name] "PHIL" (MRN [id number]) as of 10/13/2019 10:00  Ref. Range 01/24/2016 08:53  TLC Latest Units: [name] 5.70  TLC % pred Latest Units: % 94   Results for [name], [name] "PHIL" (MRN [id number]) as of 10/13/2019 10:00  Ref. Range 01/24/2016 08:53  FEV1-%Pred-Post Latest Units: % 106    He had a chest x-ray August 01, 2019 that I personally visualized.  Compared to the one in 2020 it appears that he has new onset of streaky atelectasis although he could have had the same thing in 2020 and is just more prominent now.   ROS - Results for Kleckner, Trejon [name] "PHIL" (MRN [id number]) as of 10/13/2019 10:00  Ref. Range 02/18/2016 15:37 05/13/2016 14:05 05/15/2016 08:31 05/16/2016 08:13 05/17/2016 07:01 05/18/2016 04:02 05/19/2016 08:17 05/20/2016 08:36 05/21/2016 05:31 05/23/2016 05:47 06/25/2016 09:39 07/04/2016 07:49 07/09/2016 00:00 07/22/2016 00:00 08/13/2016 09:18 12/25/2016 10:56 02/12/2017 09:47 07/09/2017 09:13 08/13/2017 09:16 11/19/2017 11:42 01/07/2018 08:52 05/13/2018 08:37 08/11/2018 08:48 08/14/2018 01:45 08/16/2018 04:11 08/18/2018 09:33 08/28/2018 14:12 08/28/2018 14:12 09/15/2018 01:02 09/16/2018 08:36 09/18/2018 04:17 09/19/2018 04:40 09/20/2018 04:40 09/26/2018 16:25 10/28/2018 11:04 11/03/2018 05:21 11/16/2018 15:26 11/19/2018 12:50 12/16/2018  09:02 03/24/2019 13:03 06/06/2019 11:56 08/04/2019 09:50 08/16/2019 09:46 09/15/2019 10:15  Hemoglobin Latest Ref Range: 13.0 - 17.0 g/dL 12.7 ([name]) 12.2 ([name]) 11.1 ([name]) 11.4 ([name]) 11.9 ([name]) 11.0 ([name]) 12.5 ([name]) 12.4 ([name]) 12.0 ([name]) 11.5 ([name]) 13.9 9.6 ([name]) 9.2 (A) 10.3 (A) 10.6 ([name]) 12.5 ([name]) 12.4 ([name]) 13.6 14.1 13.7 14.3 13.6 13.9 10.0 ([name]) 10.1 ([name]) 10.0 ([name]) 9.2 ([name])  10.7 ([name]) 11.3 ([name]) 7.2 ([name]) 8.1 ([name]) 8.4 ([name]) 10.1 ([name]) 11.2 ([name]) 7.3 ([name]) 9.5 ([name])  10.4 ([name]) 11.9 ([name]) 12.8 ([name]) 12.9 ([name]) 12.9 ([name]) 12.3 ([name]) 13.0   Results for [name], [name] "PHIL" (MRN [id number]) as of 10/13/2019 10:00  Ref. Range 05/15/2016 02:48 05/15/2016 08:31 05/16/2016 08:13 05/17/2016 07:01 05/18/2016 04:02 05/19/2016 08:17 05/20/2016 08:36 05/21/2016 05:31 05/23/2016 05:47 06/25/2016 09:39 07/04/2016 07:49 07/09/2016 00:00 08/13/2016 09:19 08/13/2016 09:19 08/14/2016 16:16 12/25/2016 10:56 07/09/2017 09:13 11/19/2017 11:42 11/19/2017 11:48 01/07/2018 08:52 01/21/2018 10:52 05/13/2018 08:37 07/01/2018 10:19 08/11/2018 08:48 08/14/2018 01:45 08/28/2018 14:12 09/15/2018 01:02 09/18/2018 04:17 09/26/2018 16:25 09/26/2018 17:48 10/28/2018 11:04 11/03/2018 05:21 11/16/2018 15:26 11/19/2018 12:50 11/19/2018 13:55 12/02/2018 09:09 12/08/2018 11:07 12/16/2018 09:02 01/31/2019 10:46 03/24/2019 13:03 06/06/2019 11:56 08/04/2019 09:50 08/16/2019 09:46 09/15/2019 10:15  Creatinine Latest Ref Range: 0.61 - 1.24 mg/dL  0.96 0.82 0.70 0.81 0.86 0.80 0.82 0.92 1.05 0.88 0.9 1.1   0.96 1.12 1.20  1.12  0.84  1.05 0.88 0.95 0.68 0.63 0.75  0.83 0.80  0.77  0.88 0.93 0.80 0.83  0.85 1.00 0.90 1.11     Echocardiogram November 30, 2018 -Normal ejection fraction with some amount of diastolic dysfunction mild aortic stenosis.  [In 2016 he had cardiac stress test that showed ejection fraction of 50% with old scar]  Results for [name], [name] "PHIL" (MRN [id number]) as of 10/13/2019 10:00  Ref. Range 10/16/2015 08:01  Monocyte-Macrophage-Serous Fluid Latest Ref Range: 50 - 90 % 23 ([name])  Other Cells, Fluid Latest Units: % MANY  Fluid Type-FCT Unknown  BRONCHIAL ALVEOLAR LAVAGE  Color, Fluid Latest Ref Range: YELLOW  COLORLESS (A)  Total Nucleated Cell Count, Fluid Latest Ref Range: 0 - 1,000 cu mm 115  Lymphs, Fluid Latest Units: % 55  Eos, Fluid Latest Units: % 10  Appearance, Fluid Latest Ref Range: CLEAR  HAZY (A)  Neutrophil Count, Fluid Latest Ref Range: 0 - 25 % 12   Ct Chest High Resolution  Result Date: 01/24/2016 CLINICAL DATA:  69 year old male with shortness of breath on exertion. History of pneumonia in May 2017. History of rheumatoid arthritis. Evaluate for interstitial lung disease. EXAM: CT CHEST WITHOUT CONTRAST TECHNIQUE: Multidetector CT imaging of the chest was performed following the standard protocol without intravenous contrast. High resolution imaging of the lungs, as well as inspiratory and expiratory imaging, was performed. COMPARISON:  Multiple priors, most recently 09/27/2015. FINDINGS: Cardiovascular: Heart size is mildly enlarged. There is no significant pericardial fluid, thickening or pericardial calcification. There is aortic atherosclerosis, as well as atherosclerosis of the great vessels of the mediastinum and the coronary arteries, including calcified atherosclerotic plaque in the left main, left anterior descending, left circumflex and right coronary arteries. Status post median sternotomy for CABG, including LIMA to the LAD. Curvilinear hypoattenuation in the myocardium of the mid to distal LAD territory, suggestive of fibrofatty metaplasia related to prior LAD territory myocardial infarction. Mediastinum/Nodes: There are multiple prominent but nonenlarged mediastinal and bilateral hilar lymph nodes, many of which are densely calcified (nonspecific). Small hiatal hernia. Surgical clips near the gastroesophageal junction. No axillary lymphadenopathy. Lungs/Pleura: High-resolution again demonstrates some patchy areas of peripheral predominant ground-glass attenuation. This has no definitive craniocaudal gradient. Some  patchy areas of associated septal thickening and subpleural reticulation with scattered areas of very mild cylindrical bronchiectasis are also noted, essentially unchanged compared to the prior study. No honeycombing. Inspiratory and expiratory imaging demonstrates some moderate air trapping, indicative of small airways disease. Scarring in the inferior segment of the lingula is unchanged. No acute consolidative airspace disease. No pleural effusions. No definite suspicious appearing pulmonary nodules  or masses are noted. Scattered calcified granulomas. Upper Abdomen: Calcified granuloma [place] in the liver and spleen. Status post cholecystectomy. Aortic atherosclerosis. Musculoskeletal: Median sternotomy wires. IMPRESSION: 1. The appearance of the chest appears essentially unchanged compared to the prior study from 09/27/2015, again most suggestive of underlying nonspecific interstitial pneumonia (NSIP) as a manifestation of rheumatoid lung disease. No definitive findings to suggest UIP at this time. 2. Aortic atherosclerosis, in addition to left main and 3 vessel coronary artery disease. Status post median sternotomy for CABG. 3. Old granulomatous disease, as above. Electronically Signed   By: Vinnie Langton M.D.   On: 01/24/2016 10:16   12/22/2019 Follow up : ILD  Patient presents for a 8-monthfollow-up.  Patient was seen last visit to reestablish for underlying interstitial lung disease.  Patient has multiple comorbidities including rheumatoid arthritis on chronic immunosuppression.  Also HIV and CLL.  Patient was set up for pulmonary function test shows no airflow obstruction or restriction.  Slight drop in FVC since 2017.  FEV1 86%, ratio 77, FVC 81%.  DLCO is stable at 65% similar to 2017.  Patient was set up for high-resolution CT chest unfortunately this is has not been completed today. ONO was positive for desaturations while sleeping . Started on oxygen 2l/m At bedtime  Doing well with this. Sleeps  better with oxygen.  Not active , limited by joint issues and shortness of breath . Has power scooter. No cough .   Hip surgery May 2021 , doing some better still painful. Following with ortho. Done with IV antibiotics.  IMPRESSION: 1. Basilar predominant pattern of subpleural fibrosis appears more organized and progressive from prior exams and may be due to usual interstitial pneumonitis or fibrotic nonspecific interstitial pneumonitis. Findings are indeterminate for UIP per consensus guidelines: Diagnosis of Idiopathic Pulmonary Fibrosis: An Official ATS/ERS/JRS/ALAT Clinical Practice Guideline. ANew [place] Iss 5, [phone] Jan 10 2017. 2. Small left fibrothorax with adjacent scarring and volume loss in the left lower lobe. 3.  Aortic atherosclerosis (ICD10-I70.0).   Electronically Signed   By: MLorin PicketM.D.   On: 10/19/2019 12:34  OV 02/16/2020  Subjective:  Patient ID: PMaryagnes [name] male , DOB: 111-25-1952, age 69y.o. , MRN: [id number], ADDRESS: [address] [place] [id number] JJanith Lima MD Relevant Other Providers: - Dr HPercival Spanish cards; Dr. CJean Rosenthalin orthopedics, Dr. PNicolette Bangneurology, Dr. JBillie Ladein [place] Dr. JRiley Churchesin infectious diseases, Dr. CAsencion PartridgeDohmeier in neurology for drug-induced polyneuropathy, Dr. FZola Buttonand oncology for CLL This Provider for this visit: Treatment Team:  Attending Provider: RBrand Males MD    02/16/2020 -   Chief Complaint  Patient presents with  . Follow-up    Pt had CT performed and is here to discuss results.  Pt states he has been doing okay since last visit. Denies any complaints of SOB, cough, or wheezing.   Immunosuppressed on basis of HIV and rheumatoid arthritis. Given necrosis factor Alpha blockade and Imuran.   #ILD - No ILD OnCT 2015. Onset September 2016 and progressing all the way through end of February 2017. June 2017 BAL:  Eosinophils 10% and lymphocytes 55%. Progressive ILD since 2017 as of June 2021 /Oct 2021 (Indeterminate/Prob UIP respectively)   -Uses nighttime oxygen since summer 2021 #HIV  -  Normal CD4 count nd antiretroviratherapy. 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 -presents for follow-up.  He last saw me in June 2021 and after that saw nurse practitioner.  He had 2 CT scans of the chest since then the first in June 2021 and the second 1 in October 2021.  I personally visualized these.  I agree with the findings of indeterminate/probable UIP respectively.  This year his ILD stable on the CT scan but compared to 2017 there is definite progression.  He himself feels overall he is stable.  His issues are mainly related to weight loss.  He says is unintentional he is lost 20 pounds this year.  This because of mobility issues.  He has had hip surgery he is using a walker he has very limited mobility.  In fact we try to walk him 185 height x3 laps but he only walked 1 lap and stop because of pain.  He is pretty cachectic.  He is on multiple medications because of his immunosuppression from HIV.  He is also immunosuppressed by taking immunomodulators for rheumatoid arthritis.  He has had 2 shots of the Covid vaccine but thinks he has to wait 6 months before getting his booster.  He is immunosuppressed.  SYMPTOM SCALE - ILD 02/16/2020 Last Weight  Most recent update: 02/16/2020  9:08 AM   Weight  73.8 kg (162 lb 12.8 oz)              O2 use ra  Shortness of Breath 0 -> 5 scale with 5 being worst (score 6 If unable to do)  At rest 1  Simple tasks - showers, clothes change, eating, shaving 2  Household (dishes, doing bed, laundry) 3  Shopping 3  Walking level at own pace 5  Walking up Stairs 5  Total (30-36) Dyspnea Score 19  How bad is your cough? 0  How bad is your fatigue 0  How bad is nausea 0  How bad is vomiting?  0  How bad is diarrhea? 3  How bad is anxiety? 0  How bad is depression 0        Simple office walk 185 feet x  3 laps goal with forehead probe 10/13/2019  02/16/2020   O2 used ra ra  Number laps completed 3 attempted by did only 1 and stopped due to hip pain 3 but did only 1 due to leg and back pain  Comments about pace slow avg pace, used walker  Resting Pulse Ox/HR 95% and 84/min 98% and 75/min  Final Pulse Ox/HR 95% and 112/min 96% and 94/min  Desaturated </= 88% no no  Desaturated <= 3% points no no  Got Tachycardic >/= 90/min yes yes  Symptoms at end of test dspnea nad hip pain Moderate dyspnea  Miscellaneous comments x     PFT Results Latest Ref Rng & Units 12/12/2019 01/24/2016 06/29/2015 08/31/2014  FVC-Pre [name] 2.93 3.46 3.26 3.98  FVC-Predicted Pre % 81 93 87 106  FVC-Post [name] - 3.52 3.16 4.11  FVC-Predicted Post % - 95 84 109  Pre FEV1/FVC % % 77  78 77 74  Post FEV1/FCV % % - 83 80 78  FEV1-Pre [name] 2.25 2.68 2.50 2.96  FEV1-Predicted Pre % 86 98 90 106  FEV1-Post [name] - 2.91 2.51 3.21  DLCO uncorrected ml/min/mmHg 14.36 16.88 14.89 16.15  DLCO UNC% % 65 65 58 63  DLCO corrected ml/min/mmHg 16.75 - 14.69 -  DLCO COR %Predicted % 76 - 57 -  DLVA Predicted % 81 77 81 94  TLC [name] - 5.70 5.86 5.54  TLC % Predicted % - 94 97 92  RV % Predicted % - 103 106 97   CT Chest High Resolution  Result Date: 02/13/2020 CLINICAL DATA:  Follow-up interstitial lung disease. History of CLL. EXAM: CT CHEST WITHOUT CONTRAST TECHNIQUE: Multidetector CT imaging of the chest was performed following  the standard protocol without intravenous contrast. High resolution imaging of the lungs, as well as inspiratory and expiratory imaging, was performed. COMPARISON:  10/18/2019 high-resolution chest CT. FINDINGS: Cardiovascular: Borderline mild cardiomegaly. Subendomyocardial fat throughout the left ventricular wall is unchanged and compatible with remote myocardial infarction. Three-vessel coronary atherosclerosis status post CABG. No significant pericardial effusion/thickening. Atherosclerotic nonaneurysmal thoracic aorta. Normal caliber pulmonary arteries. Mediastinum/Nodes: No discrete thyroid nodules. Unremarkable esophagus. No axillary adenopathy. Coarsely calcified nonenlarged paratracheal, subcarinal and right hilar nodes, compatible with prior granulomatous disease, unchanged. No pathologically enlarged noncalcified mediastinal or discrete hilar nodes on these noncontrast images. Lungs/Pleura: No pneumothorax. Loculated small basilar left pleural effusion with minimal smooth left pleural thickening and parenchymal banding at the left lung base, unchanged. No right pleural effusion. Subcentimeter calcified bilateral upper lobe granulomas are unchanged. No acute consolidative airspace disease or lung masses. No significant pulmonary nodules. No significant lobular air trapping or evidence of tracheobronchomalacia on the expiration sequence. There is mild-to-moderate patchy subpleural reticulation and ground-glass opacity in both lungs with associated mild traction bronchiectasis and architectural distortion. There is a basilar predominance to these findings. No frank honeycombing. No compelling interval progression since recent 10/18/2019 chest CT. Findings have progressed mildly since 01/24/2016 chest CT as previously described. Upper abdomen: Small to moderate hiatal hernia. Granulomatous calcifications scattered throughout the liver and spleen. Cholecystectomy. Musculoskeletal: No aggressive appearing focal  osseous lesions. Intact sternotomy wires. Soft tissue anchors in the anterior right humeral head. Partially visualized bilateral posterior spinal fusion hardware extending inferiorly from T11 level. Marked lower thoracic spondylosis. IMPRESSION: 1. Spectrum of findings compatible with basilar predominant fibrotic interstitial lung disease without frank honeycombing and without compelling interval progression since recent 10/18/2019 chest CT. Findings have progressed mildly since 01/24/2016 chest CT, as previously described. Differential includes UIP or fibrotic NSIP. Findings are categorized as probable UIP per consensus guidelines: Diagnosis of Idiopathic Pulmonary Fibrosis: An Official ATS/ERS/JRS/ALAT Clinical Practice Guideline. [place], Iss 5, [phone], Jan 10 2017. 2. Borderline mild cardiomegaly. Evidence of remote myocardial infarction in the left ventricle. 3. Chronic loculated small basilar left pleural effusion with associated pleuroparenchymal scarring. 4. Small to moderate hiatal hernia. 5. Aortic Atherosclerosis (ICD10-I70.0). Electronically Signed   By: Ilona Sorrel M.D.   On: 02/13/2020 09:57     ROS - per HPI   OV 04/02/2020  Subjective:  Patient ID: [name], male , DOB: [birth year] , age 62 y.o. , MRN: [id number] , ADDRESS: [place] [id number] PCP [name], MD Patient Care Team: [name], MD as PCP - General (Internal Medicine) Comer, Okey Regal, MD as PCP - Infectious Diseases (Infectious Diseases) [name],  MD as PCP - Cardiology (Cardiology) Marlou Sa, Tonna Corner, MD (Orthopedic Surgery) Kary Kos, MD (Neurosurgery) Wellington Hampshire, MD (Cardiology) Unice Bailey, MD (Rheumatology) Wyatt Portela, MD (Hematology and Oncology) Hilarie Fredrickson Lajuan Lines, MD (Gastroenterology)  This Provider for this visit: Treatment Team:  Attending Provider: Brand Males, MD    04/02/2020 -   Chief Complaint    Patient presents with  . Follow-up    Pt states he has been doing okay since last visit. Is currently on Esbriet and states he is now doing fine on it.SOB is about the same since last visit.    Immunosuppressed on basis of HIV and rheumatoid arthritis. Given necrosis factor Alpha blockade and Imuran.   #ILD - No ILD OnCT 2015. Onset September 2016 and progressing all the way through end of February 2017. June 2017 BAL: Eosinophils 10% and lymphocytes 55%. Progressive ILD since 2017 as of June 2021 /Oct 2021 (Indeterminate/Prob UIP respectively)   -Uses nighttime oxygen since summer 2021  -Status post bronchoscopy February 27, 2020: Unable to do cell count blood culture negative #HIV  -  Normal CD4 count nd antiretroviratherapy.  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 -returns for follow-up.  This visit is to review the results of bronchoscopy mid October 2021.  The culture is negative.  A cell count could not be done because of mucus.  Nevertheless the main purpose was cultures and this is negative.  Negative so far.  Noted that he is yet to have his QuantiFERON gold.  His Covid IgG was nonreactive after the second vaccine.  He then had his third booster shot approximately a month ago.  At this point in time he is on pirfenidone since end of September early October 2021.  He is tolerating it well without any weight loss or any side effects.  He wants to rule this over to 1  pill 3 times daily.  He is in need of a liver function test today.  He is not on oxygen therapy.  He is interested in the ILD-Pro study but he is worried he may not qualify because of CLL He tells me is a new skin lesion in the right groin.  He showed it to me and it looks like tinea cruris.  There is no skin rash in the sun exposed areas which would be consistent with pirfenidone but this one is in the right groin  Overall he continues to complain about restricted mobility and severe arthritis pain.  This is despite treatment.  He is going to talk to his rheumatologist about that.     ROS - per HPI    SYMPTOM SCALE - ILD 02/16/2020 Last Weight  Most recent update: 02/16/2020  9:08 AM   Weight  73.8 kg (162 lb 12.8 oz)            04/02/2020 Last Weight  Most recent update: 04/02/2020 10:15 AM  Weight  73.8 kg (162 lb 12.8 oz)             O2 use ra   Shortness of Breath 0 -> 5 scale with 5 being worst (score 6 If unable to do)   At rest 1   Simple tasks - showers, clothes change, eating, shaving 2   Household (dishes, doing bed, laundry) 3   Shopping 3   Walking level at own pace 5   Walking up Stairs 5   Total (30-36) Dyspnea Score 19   How bad is your cough? 0   How bad is your fatigue 0   How bad is nausea 0   How bad is vomiting?  0   How bad is diarrhea? 3   How bad is anxiety? 0   How bad is depression 0         Simple office walk 185 feet x  3 laps goal with forehead probe 10/13/2019  02/16/2020   O2 used ra ra  Number laps completed 3 attempted by did only 1 and stopped due to hip pain 3 but did only 1 due to leg and back pain  Comments about pace slow avg pace, used walker  Resting Pulse Ox/HR 95% and 84/min 98% and 75/min  Final Pulse Ox/HR 95% and 112/min 96% and 94/min  Desaturated </= 88% no no  Desaturated <= 3% points no no  Got Tachycardic >/= 90/min yes yes  Symptoms at end of test dspnea nad hip pain Moderate dyspnea  Miscellaneous comments  x    PFT Results Latest Ref Rng & Units 12/12/2019 01/24/2016 06/29/2015 08/31/2014  FVC-Pre [name] 2.93 3.46 3.26 3.98  FVC-Predicted Pre % 81 93 87 106  FVC-Post [name] - 3.52 3.16 4.11  FVC-Predicted Post % - 95 84 109  Pre FEV1/FVC % % 77 78 77 74  Post FEV1/FCV % % - 83 80 78  FEV1-Pre [name] 2.25 2.68 2.50 2.96  FEV1-Predicted Pre % 86 98 90 106  FEV1-Post [name] - 2.91 2.51 3.21  DLCO uncorrected ml/min/mmHg 14.36 16.88 14.89 16.15  DLCO UNC% % 65 65 58 63  DLCO corrected ml/min/mmHg 16.75 - 14.69 -  DLCO COR %Predicted % 76 - 57 -  DLVA Predicted % 81 77 81 94  TLC [name] - 5.70 5.86 5.54  TLC % Predicted % - 94 97 92  RV % Predicted % - 103 106 97      has a past medical history of Allergy, Anxiety, Carotid artery occlusion, Cataract, Chronic back pain, CLL (chronic lymphoblastic leukemia) (dx 2010), Clotting disorder ([place]), Coronary artery disease (2010), Depression, Diverticulosis, DVT, lower extremity, recurrent ([place]) (2008, 2009), Esophagitis, Fibromyalgia, Gallstones, GERD (gastroesophageal reflux disease), Gout, Gynecomastia, male, H/O hiatal hernia (2008), Hemorrhoids, Hepatitis A (yrs ago), HIV infection ([place]) (dx 1993), Hypertension, Impotence of organic origin, Myocardial infarction ([place]) (2010), Neuromuscular disorder ([place]), On home oxygen therapy, Osteoarthritis, knee, Osteoporosis, PONV (postoperative nausea and vomiting), Pulmonary fibrosis ([place]) (2017), Rheumatoid arthritis(714.0) (dx 2010), Seasonal allergies, Secondary syphilis (07/24/14 dx), Status post dilation of esophageal narrowing, Stroke ([place]) (1969), TIA (transient ischemic attack) (1997), and Tubular adenoma of colon.   reports that he has never smoked. He has never used smokeless tobacco.  Past Surgical History:  Procedure Laterality Date  . ACETABULAR REVISION Left 11/03/2019   REVISION LEFT HIP ACETABULAR COMPONENT (Left Hip)  . ANTERIOR HIP REVISION Left 08/17/2018   Procedure: ANTERIOR LEFT HIP REVISION;  Surgeon: Mcarthur Rossetti, MD;  Location: [facility]  OR;  Service: Orthopedics;  Laterality: Left;  . ANTERIOR HIP REVISION Left 11/02/2018   Procedure: LEFT HIP CONSTRAINED LINER REVISION;  Surgeon: Mcarthur Rossetti, MD;  Location: [place];  Service: Orthopedics;  Laterality: Left;  . ANTERIOR HIP REVISION Left 11/03/2019   Procedure: REVISION LEFT HIP ACETABULAR COMPONENT;  Surgeon: Mcarthur Rossetti, MD;  Location: [place];  Service: Orthopedics;  Laterality: Left;  . BACK SURGERY  2010 & 2017  . BRONCHIAL WASHINGS  02/27/2020   Procedure: BRONCHIAL WASHINGS;  Surgeon: Brand Males, MD;  Location: [facility];  Service: Cardiopulmonary;;  . CHOLECYSTECTOMY    . COLONOSCOPY WITH PROPOFOL N/A 12/28/2012   Procedure: COLONOSCOPY WITH PROPOFOL;  Surgeon: Jerene Bears, MD;  Location: [facility];  Service: Gastroenterology;  Laterality: N/A;  . CORONARY ARTERY BYPASS GRAFT  2010   triple bypass  . ESOPHAGOGASTRODUODENOSCOPY (EGD) WITH PROPOFOL N/A 12/28/2012   Procedure: ESOPHAGOGASTRODUODENOSCOPY (EGD) WITH PROPOFOL;  Surgeon: Jerene Bears, MD;  [facility]: [facility];  Service: Gastroenterology;  Laterality: N/A;  . ESOPHAGOGASTRODUODENOSCOPY (EGD) WITH PROPOFOL N/A 03/15/2013   Procedure: ESOPHAGOGASTRODUODENOSCOPY (EGD) WITH PROPOFOL;  Surgeon: Jerene Bears, MD;  [facility];  Service: Gastroenterology;  Laterality: N/A;  . ESOPHAGOGASTRODUODENOSCOPY (EGD) WITH PROPOFOL N/A 02/07/2016   Procedure: ESOPHAGOGASTRODUODENOSCOPY (EGD) WITH PROPOFOL;  Surgeon: Jerene Bears, MD;  [facility];  Service: Gastroenterology;  Laterality: N/A;  . HARDWARE REMOVAL N/A 07/02/2012   Procedure: HARDWARE REMOVAL;  Surgeon: Elaina Hoops, MD;  Location: [facility];  Service: Neurosurgery;  Laterality: N/A;  . HIATAL HERNIA REPAIR     wrap  . HIP CLOSED REDUCTION Left 08/15/2018   Procedure: CLOSED REDUCTION HIP;  Surgeon: Newt Minion, MD;  Location: [facility];  Service: Orthopedics;  Laterality: Left;   . HIP CLOSED REDUCTION Left 08/28/2018   Procedure: CLOSED REDUCTION HIP FOR RECURRENT DISLOCATION AND DRESSING CHANGE;  Surgeon: Jessy Oto, MD;  Location: [place];  Service: Orthopedics;  Laterality: Left;  . HIP CLOSED REDUCTION Left 09/15/2018   Procedure: CLOSED REDUCTION HIP DISLOCATION;  Surgeon: Mcarthur Rossetti, MD;  Location: [place];  Service: Orthopedics;  Laterality: Left;  . HIP CLOSED REDUCTION Left 10/31/2018   Procedure: CLOSED REDUCTION LEFT TOTAL HIP;  Surgeon: Leandrew Koyanagi, MD;  Location: [place];  Service: Orthopedics;  Laterality: Left;  . INGUINAL HERNIA REPAIR Bilateral   . JOINT REPLACEMENT Left 1999  . KNEE ARTHROPLASTY  07/22/2011   Procedure: COMPUTER ASSISTED TOTAL KNEE ARTHROPLASTY;  Surgeon: Meredith Pel, MD;  Location: [place];  Service: Orthopedics;  Laterality: Right;  Right total knee arthroplasty  . MANDIBLE SURGERY Bilateral    tmj  . REPLACEMENT TOTAL KNEE Bilateral   . ring around testicle hernia reapir  184 and 1986   x 2  . ROTATOR CUFF REPAIR Right   . SHOULDER SURGERY Left   . SPINE SURGERY  2010   "rod and screws", "failed", lopwer spine,   . stent to heart x 1  2010  . TONSILLECTOMY    . TOTAL HIP ARTHROPLASTY Left 08/13/2018   Procedure: LEFT TOTAL HIP ARTHROPLASTY ANTERIOR APPROACH;  Surgeon: Mcarthur Rossetti, MD;  Location: [place];  Service: Orthopedics;  Laterality: Left;  . TOTAL HIP ARTHROPLASTY Left 09/17/2018   Procedure: ANTERIOR LEFT HIP REVISION;  Surgeon: Mcarthur Rossetti, MD;  Location: [place];  Service: Orthopedics;  Laterality: Left;  . UMBILICAL HERNIA REPAIR     x 1  . varicose  vein     stripping  . VIDEO BRONCHOSCOPY Bilateral 10/16/2015   Procedure: VIDEO BRONCHOSCOPY WITHOUT FLUORO;  Surgeon: Brand Males, MD;  Location[facility];  Service: Cardiopulmonary;  Laterality: Bilateral;  . VIDEO BRONCHOSCOPY N/A 02/27/2020   Procedure: VIDEO BRONCHOSCOPY;  Surgeon: Brand Males, MD;  [facility];  Service: Cardiopulmonary;  Laterality: N/A;  bronchial lavage    Allergies  Allergen Reactions  . Golimumab Anaphylaxis    Simponi ARIA  . Orencia [Abatacept] Anaphylaxis  . Other Anaphylaxis and Hives    Pecans  . Peanut-Containing Drug Products Anaphylaxis, Hives and Swelling    Swelling of throat  . Morphine Other (See Comments)    Severe headache- Can tolerate Dilaudid, however  . Oxycodone-Acetaminophen Other (See Comments)    Headache 6.22.2020 patient is currently taking and tolerating  . Promethazine Hcl Other (See Comments)    Makes him feel "drunk" at higher strengths    Immunization History  Administered Date(s) Administered  . Fluad Quad(high Dose 65+) 12/30/2018, 02/15/2020  . H1N1 04/28/2008  . Hepatitis A, Adult 09/07/2007, 03/10/2008  . Hepatitis B, adult 09/08/2013, 10/27/2013, 03/02/2014  . Influenza Split 01/17/2011, 02/11/2012, 02/18/2013, 02/10/2015  . Influenza, High Dose Seasonal PF 02/02/2014, 01/25/2016, 01/14/2018  . Influenza-Unspecified 02/13/2017  . PFIZER SARS-COV-2 Vaccination 06/23/2019, 07/19/2019, 02/29/2020  . PPD Test 02/22/2007, 06/12/2008  . Pneumococcal Conjugate-13 08/10/2014  . Pneumococcal Polysaccharide-23 05/13/2003, 12/08/2007, 01/16/2009, 10/27/2013  . Td 11/19/2011  . Zoster 03/02/2014    Family History  Problem Relation Age of Onset  . Breast cancer Mother   . Hypertension Mother   . Hyperlipidemia Mother   . Diabetes Mother   . Prostate cancer Father   . Colon polyps Father   . Hyperlipidemia Father   . Crohn's disease Paternal Aunt   . Diabetes Maternal Grandmother   . Diabetes Brother        x 3  . Heart attack Brother   . Heart disease Brother        x 3  . Heart attack Brother   . Hyperlipidemia Brother        x 3  . Alcohol abuse Daughter   . Drug abuse Daughter   . Asthma Brother   . CVA Brother   . Colon cancer Neg Hx   . Esophageal cancer Neg Hx   . Rectal cancer Neg Hx   . Stomach  cancer Neg Hx      Current Outpatient Medications:  .  acetaminophen (TYLENOL) 500 MG tablet, Take 500-1,000 mg by mouth every 6 (six) hours as needed for mild pain or moderate pain. , Disp: , Rfl:  .  alfuzosin (UROXATRAL) 10 MG 24 hr tablet, Take 1 tablet (10 mg total) by mouth daily with breakfast., Disp: 30 tablet, Rfl: 11 .  atorvastatin (LIPITOR) 10 MG tablet, Take 1 tablet (10 mg total) by mouth at bedtime., Disp: 30 tablet, Rfl: 9 .  bictegravir-emtricitabine-tenofovir AF (BIKTARVY) 50-200-25 MG TABS tablet, Take 1 tablet by mouth daily. , Disp: , Rfl:  .  darunavir-cobicistat (PREZCOBIX) 800-150 MG tablet, Take 1 tablet by mouth daily with breakfast. , Disp: , Rfl:  .  docusate sodium (COLACE) 100 MG capsule, Take 1 capsule (100 mg total) by mouth 2 (two) times daily., Disp: 10 capsule, Rfl: 0 .  EPINEPHrine (EPIPEN 2-PAK) 0.3 mg/0.3 mL IJ SOAJ injection, Inject 0.3 mg into the muscle as needed for anaphylaxis., Disp: , Rfl:  .  escitalopram (LEXAPRO) 20 MG tablet, TAKE 1  TABLET BY MOUTH EVERY DAY, Disp: 90 tablet, Rfl: 1 .  ezetimibe (ZETIA) 10 MG tablet, Take 1 tablet (10 mg total) by mouth daily. (Patient taking differently: Take 10 mg by mouth at bedtime. ), Disp: 90 tablet, Rfl: 3 .  folic acid (FOLVITE) 1 MG tablet, Take 1 mg by mouth daily., Disp: , Rfl: 11 .  KLOR-CON M20 20 MEQ tablet, TAKE 1 TABLET (20 MEQ TOTAL) BY MOUTH 3 (THREE) TIMES DAILY. (Patient taking differently: Take 20 mEq by mouth 2 (two) times daily. ), Disp: 270 tablet, Rfl: 0 .  [name]-methylfolate-B6-B12 (METANX) 3-35-2 MG TABS tablet, TAKE 1 TABLET BY MOUTH 2 (TWO) TIMES DAILY., Disp: 180 tablet, Rfl: 1 .  leflunomide (ARAVA) 20 MG tablet, Take 20 mg by mouth daily. , Disp: , Rfl:  .  metoprolol succinate (TOPROL-XL) 50 MG 24 hr tablet, Take 1 tablet (50 mg total) by mouth daily. Take with or immediately following a meal., Disp: 30 tablet, Rfl: 0 .  ondansetron (ZOFRAN) 4 MG tablet, Take 4 mg by mouth every 6 (six)  hours as needed for nausea., Disp: , Rfl:  .  oxyCODONE (OXY IR/ROXICODONE) 5 MG immediate release tablet, Take 1-2 tablets (5-10 mg total) by mouth every 6 (six) hours as needed for moderate pain (pain score 4-6)., Disp: 30 tablet, Rfl: 0 .  Pirfenidone (ESBRIET) 267 MG TABS, Take 3 tablets (801 mg total) by mouth in the morning, at noon, and at bedtime., Disp: 270 tablet, Rfl: 11 .  prednisoLONE acetate (PRED FORTE) 1 % ophthalmic suspension, Place 1 drop into both eyes in the morning and at bedtime., Disp: , Rfl:  .  predniSONE (DELTASONE) 5 MG tablet, Take 5 mg by mouth daily with breakfast. , Disp: , Rfl: 1 .  pregabalin (LYRICA) 200 MG capsule, Take 1 capsule (200 mg total) by mouth 2 (two) times daily., Disp: 60 capsule, Rfl: 5 .  SAVAYSA 30 MG TABS tablet, TAKE 1 TABLET (30 MG TOTAL) BY MOUTH DAILY., Disp: 30 tablet, Rfl: 5 .  silodosin (RAPAFLO) 8 MG CAPS capsule, Take 1 capsule (8 mg total) by mouth daily with breakfast., Disp: 30 capsule, Rfl: 3 .  sulfamethoxazole-trimethoprim (BACTRIM DS) 800-160 MG tablet, Take 1 tablet by mouth daily., Disp: 90 tablet, Rfl: 1 .  sulfaSALAzine (AZULFIDINE) 500 MG tablet, Take 500 mg by mouth in the morning and at bedtime. , Disp: , Rfl:  .  tamsulosin (FLOMAX) 0.4 MG CAPS capsule, TAKE 1 CAPSULE (0.4 MG TOTAL) BY MOUTH DAILY AFTER SUPPER., Disp: 30 capsule, Rfl: 1 .  thiamine 100 MG tablet, Take 1 tablet (100 mg total) by mouth every other day., Disp: 45 tablet, Rfl: 1 .  torsemide (DEMADEX) 20 MG tablet, Take 20 mg by mouth daily., Disp: , Rfl:  .  valACYclovir (VALTREX) 500 MG tablet, TAKE 1 TABLET BY MOUTH EVERY DAY, Disp: 30 tablet, Rfl: 4 .  nitroGLYCERIN (NITROSTAT) 0.4 MG SL tablet, Place 1 tablet (0.4 mg total) under the tongue every 5 (five) minutes as needed for chest pain. (Patient not taking: Reported on 04/02/2020), Disp: 25 tablet, Rfl: 2      Objective:   Vitals:   04/02/20 1013  BP: 104/60  Pulse: 84  SpO2: 99%  Weight: 162 lb  12.8 oz (73.8 kg)  Height: _0  (1.702 m)    Estimated body mass index is 25.5 kg/m as calculated from the following:   Height as of this encounter: _1  (1.702 m).   Weight as of  this encounter: 162 lb 12.8 oz (73.8 kg).  _0 @  Filed Weights   04/02/20 1013  Weight: 162 lb 12.8 oz (73.8 kg)     Physical Exam Pleasant disabled man sitting on a scooter.  He has rheumatoid arthritis deformities.  He has scar from previous spine surgery.  He has scoliosis.  He has significant severe rheumatoid arthritis deformities.  He has crackles in his lower extremities.  He is frail and cachectic.        Assessment:       ICD-10-CM   1. Interstitial lung disease due to connective tissue disease (HCC)  J84.89 Hepatic function panel   M35.9 SARS-COV-2 IgG    QuantiFERON-TB Gold Plus  2. Immunocompromised state ([place])  D84.9   3. Encounter for therapeutic drug monitoring  Z51.81   4. High risk medication use  Z79.899   5. Tinea cruris  B35.6        Plan:     Patient Instructions   You have slowly progressive interstitial lung disease between 2017 and 2021; Within 2021 it seems stable  Glad you are tolerating esbriet well - started late sept 2021/early oct 2021  Most likely this is due to rheumatoid arthritis causing progressive pulmonary fibrosis over time  Opportunistic infections in the setting of immunosuppressed status hs been rule out with bronch  02/27/20  Being immunosuppressed you might not have mounted a full antibody response to you to Covid vaccine despite booster 02/29/20  New right groin skin infection - Is not esbriet related. I think is called TINEA CRURIS    Plan -- Do Quantiferon gold (last 2018 was negative) -Get Covid IgG blood work to see response 4 weeks after booster - -Glad you are tolerating t pirfenidone  Well  - check LFT 04/02/2020 and again in in 4 weeks  - change to 1 big till three times daily - do spirometyr and dlco in 8-12  weeks    - start 1% clotrimazole ointment twice daily for 2 weeks  - if does not resolve call PCP or HIV doctor   FOLLOWUP  - 8-12  WEEKS WITH Dr Chase Caller in 30 min slot  -  -  ILD symptom score and assess eligibility for ILD-pro registry study at next visit      SIGNATURE    Dr. Brand Males, M.D., F.C.C.P,  Pulmonary and Critical Care Medicine Staff Physician, [place] Director - Interstitial Lung Disease  Program  Pulmonary [place] at [place], [place], [address]  Pager: [phone], If no answer or between  15:00h - 7:00h: call [phone] Telephone: [phone]  10:43 AM 04/02/2020

## 2020-04-03 ENCOUNTER — Telehealth: Payer: Self-pay | Admitting: Internal Medicine

## 2020-04-03 NOTE — Telephone Encounter (Signed)
   Please let [name] know that   A) LFT normal on esbriet  B) despite 3rd shot of covid vaccine that he has NOT mounted an immune response to the covid vaccine. This is because of his immune suppression. This essentuially is like an unvaccinated person. I do not know what to do other than in future, we might have a study for him where he might get monthly injections of the antibody (versus placebo) for 6-12 months as a way to prevent him from getting covid. If so, will let him know. Till then, He has to keep himself social distanced and masked to protect himself. I   Results for [name], [name] (MRN [id number]) as of 04/03/2020 06:08  Ref. Range 04/02/2020 10:49  SARS-COV-2 IgG Latest Ref Range: Non-Reactive  0.02 Non-Reactive

## 2020-04-04 ENCOUNTER — Other Ambulatory Visit: Payer: Self-pay

## 2020-04-04 DIAGNOSIS — N400 Enlarged prostate without lower urinary tract symptoms: Secondary | ICD-10-CM

## 2020-04-04 LAB — QUANTIFERON-TB GOLD PLUS
Mitogen-NIL: >10 IU/mL
NIL: 0.05 IU/mL
QuantiFERON-TB Gold Plus: NEGATIVE
TB1-NIL: 0 IU/mL
TB2-NIL: 0 IU/mL

## 2020-04-04 MED ORDER — TAMSULOSIN HCL 0.4 MG PO CAPS: 0.4000 mg | ORAL_CAPSULE | Freq: Every day | ORAL | 5 refills | Status: AC

## 2020-04-04 NOTE — Telephone Encounter (Signed)
Called and spoke with pt letting him know the results of labwork and recommendations per MR. Pt verbalized understanding. Nothing further needed.

## 2020-04-05 NOTE — Progress Notes (Signed)
Quantiferon gold TB test is negative. Will not call the results due to normal nature

## 2020-04-09 ENCOUNTER — Ambulatory Visit (INDEPENDENT_AMBULATORY_CARE_PROVIDER_SITE_OTHER): Payer: Medicare Other | Admitting: Orthopaedic Surgery

## 2020-04-09 ENCOUNTER — Ambulatory Visit (INDEPENDENT_AMBULATORY_CARE_PROVIDER_SITE_OTHER): Payer: Medicare Other

## 2020-04-09 ENCOUNTER — Other Ambulatory Visit: Payer: Self-pay | Admitting: Orthopaedic Surgery

## 2020-04-09 ENCOUNTER — Encounter: Payer: Self-pay | Admitting: Orthopaedic Surgery

## 2020-04-09 DIAGNOSIS — M25552 Pain in left hip: Secondary | ICD-10-CM

## 2020-04-09 DIAGNOSIS — Z96642 Presence of left artificial hip joint: Secondary | ICD-10-CM

## 2020-04-09 DIAGNOSIS — I2510 Atherosclerotic heart disease of native coronary artery without angina pectoris: Secondary | ICD-10-CM

## 2020-04-09 NOTE — Progress Notes (Signed)
The patient is now getting close to 6 months status post revision arthroplasty of a failed left total hip.  Postoperatively he did have an infection as well.  He still reports left hip and groin pain.  X-rays reviewed today and compared to previous films and I see no worrisome features of the hip revision arthroplasty.  I compared this to all previous films and surgery.  The components themselves appear to be well-seated and there has been no change in position.  I gave him reassurance that from my standpoint there is nothing else that I recommend for now.  If things worsen I would certainly like to see him but otherwise we can see him in 3 months for repeat low AP pelvis and lateral of his left hip.

## 2020-04-11 ENCOUNTER — Encounter: Payer: Self-pay | Admitting: Internal Medicine

## 2020-04-11 ENCOUNTER — Ambulatory Visit (INDEPENDENT_AMBULATORY_CARE_PROVIDER_SITE_OTHER): Payer: Medicare Other | Admitting: Internal Medicine

## 2020-04-11 ENCOUNTER — Other Ambulatory Visit: Payer: Self-pay

## 2020-04-11 VITALS — BP 122/70 | HR 82 | Temp 98.2°F | Resp 16 | Ht 67.0 in | Wt 166.0 lb

## 2020-04-11 DIAGNOSIS — E519 Thiamine deficiency, unspecified: Secondary | ICD-10-CM

## 2020-04-11 DIAGNOSIS — I2510 Atherosclerotic heart disease of native coronary artery without angina pectoris: Secondary | ICD-10-CM

## 2020-04-11 DIAGNOSIS — L233 Allergic contact dermatitis due to drugs in contact with skin: Secondary | ICD-10-CM | POA: Diagnosis not present

## 2020-04-11 DIAGNOSIS — B354 Tinea corporis: Secondary | ICD-10-CM

## 2020-04-11 DIAGNOSIS — B3789 Other sites of candidiasis: Secondary | ICD-10-CM

## 2020-04-11 MED ORDER — CICLOPIROX OLAMINE 0.77 % EX CREA: TOPICAL_CREAM | Freq: Two times a day (BID) | CUTANEOUS | 1 refills | Status: DC

## 2020-04-11 MED ORDER — FLUOCINONIDE 0.05 % EX CREA: 1.0000 "application " | TOPICAL_CREAM | Freq: Two times a day (BID) | CUTANEOUS | 0 refills | Status: DC

## 2020-04-11 MED ORDER — THIAMINE HCL 100 MG PO TABS: 100.0000 mg | ORAL_TABLET | ORAL | 1 refills | Status: AC

## 2020-04-11 NOTE — Patient Instructions (Signed)

## 2020-04-11 NOTE — Progress Notes (Signed)
Subjective:  Patient ID: [name], male    DOB: 11-16-1950  Age: 69 y.o. MRN: [id number]  CC: Rash  This visit occurred during the SARS-CoV-2 public health emergency.  Safety protocols were in place, including screening questions prior to the visit, additional usage of staff PPE, and extensive cleaning of exam room while observing appropriate contact time as indicated for disinfecting solutions.    HPI [name] presents for f/up - He complains of a 2-week history of itchy rash on his right lower abdomen.  It sounds like he has been treating it with mupirocin topically.  This has not helped and the itching has worsened.    Outpatient Medications Prior to Visit  Medication Sig Dispense Refill  . acetaminophen (TYLENOL) 500 MG tablet Take 500-1,000 mg by mouth every 6 (six) hours as needed for mild pain or moderate pain.     [name] atorvastatin (LIPITOR) 10 MG tablet Take 1 tablet (10 mg total) by mouth at bedtime. 30 tablet 9  . bictegravir-emtricitabine-tenofovir AF (BIKTARVY) 50-200-25 MG TABS tablet Take 1 tablet by mouth daily.     . clindamycin (CLEOCIN) 300 MG capsule Take 2 capsules (600 mg total) by mouth See admin instructions. Take two by mouth one hour before dental appointment, then two by mouth six hours after appointment 6 capsule 3  . darunavir-cobicistat (PREZCOBIX) 800-150 MG tablet Take 1 tablet by mouth daily with breakfast.     . EPINEPHrine (EPIPEN 2-PAK) 0.3 mg/0.3 mL IJ SOAJ injection Inject 0.3 mg into the muscle as needed for anaphylaxis.    [name] escitalopram (LEXAPRO) 20 MG tablet TAKE 1 TABLET BY MOUTH EVERY DAY 90 tablet 1  . ezetimibe (ZETIA) 10 MG tablet Take 1 tablet (10 mg total) by mouth daily. (Patient taking differently: Take 10 mg by mouth at bedtime. ) 90 tablet 3  . folic acid (FOLVITE) 1 MG tablet Take 1 mg by mouth daily.  11  . KLOR-CON M20 20 MEQ tablet TAKE 1 TABLET (20 MEQ TOTAL) BY MOUTH 3 (THREE) TIMES DAILY. (Patient taking differently: Take  20 mEq by mouth 2 (two) times daily. ) 270 tablet 0  . l-methylfolate-B6-B12 (METANX) 3-35-2 MG TABS tablet TAKE 1 TABLET BY MOUTH 2 (TWO) TIMES DAILY. 180 tablet 1  . leflunomide (ARAVA) 20 MG tablet Take 20 mg by mouth daily.     . metoprolol succinate (TOPROL-XL) 50 MG 24 hr tablet Take 1 tablet (50 mg total) by mouth daily. Take with or immediately following a meal. 30 tablet 0  . nitroGLYCERIN (NITROSTAT) 0.4 MG SL tablet Place 1 tablet (0.4 mg total) under the tongue every 5 (five) minutes as needed for chest pain. 25 tablet 2  . ondansetron (ZOFRAN) 4 MG tablet Take 4 mg by mouth every 6 (six) hours as needed for nausea.    [name] oxyCODONE (OXY IR/ROXICODONE) 5 MG immediate release tablet Take 1-2 tablets (5-10 mg total) by mouth every 6 (six) hours as needed for moderate pain (pain score 4-6). 30 tablet 0  . Pirfenidone (ESBRIET) 801 MG TABS Take 1 tablet by mouth in the morning, at noon, and at bedtime. 90 tablet 11  . prednisoLONE acetate (PRED FORTE) 1 % ophthalmic suspension Place 1 drop into both eyes in the morning and at bedtime.    . predniSONE (DELTASONE) 5 MG tablet Take 5 mg by mouth daily with breakfast.   1  . pregabalin (LYRICA) 200 MG capsule Take 1 capsule (200 mg total) by mouth 2 (  two) times daily. 60 capsule 5  . SAVAYSA 30 MG TABS tablet TAKE 1 TABLET (30 MG TOTAL) BY MOUTH DAILY. 30 tablet 5  . sulfamethoxazole-trimethoprim (BACTRIM DS) 800-160 MG tablet Take 1 tablet by mouth daily. 90 tablet 1  . sulfaSALAzine (AZULFIDINE) 500 MG tablet Take 500 mg by mouth in the morning and at bedtime.     . tamsulosin (FLOMAX) 0.4 MG CAPS capsule Take 1 capsule (0.4 mg total) by mouth daily after supper. 30 capsule 5  . torsemide (DEMADEX) 20 MG tablet Take 20 mg by mouth daily.    . valACYclovir (VALTREX) 500 MG tablet TAKE 1 TABLET BY MOUTH EVERY DAY 30 tablet 4  . thiamine 100 MG tablet Take 1 tablet (100 mg total) by mouth every other day. 45 tablet 1  . RESTASIS 0.05 % ophthalmic  emulsion     . docusate sodium (COLACE) 100 MG capsule Take 1 capsule (100 mg total) by mouth 2 (two) times daily. 10 capsule 0   No facility-administered medications prior to visit.    ROS Review of Systems  Constitutional: Negative for chills and fever.  HENT: Negative.  Negative for sore throat.   Eyes: Negative.   Respiratory: Negative for cough and shortness of breath.   Cardiovascular: Positive for leg swelling. Negative for chest pain and palpitations.  Gastrointestinal: Negative for abdominal pain, diarrhea, nausea and vomiting.  Endocrine: Negative.   Genitourinary: Negative.  Negative for difficulty urinating.  Musculoskeletal: Negative.  Negative for arthralgias.  Skin: Positive for color change and rash.  Neurological: Negative.  Negative for dizziness, weakness and light-headedness.  Hematological: Negative for adenopathy. Does not bruise/bleed easily.  Psychiatric/Behavioral: Negative.   All other systems reviewed and are negative.   Objective:  BP 122/70   Pulse 82   Temp 98.2 F (36.8 C) (Oral)   Resp 16   Ht 5\' 7"  (1.702 m)   Wt 166 lb (75.3 kg)   SpO2 94%   BMI 26.00 kg/m   BP Readings from Last 3 Encounters:  04/11/20 122/70  04/02/20 104/60  03/21/20 136/74    Wt Readings from Last 3 Encounters:  04/11/20 166 lb (75.3 kg)  04/02/20 162 lb 12.8 oz (73.8 kg)  03/21/20 162 lb (73.5 kg)    Physical Exam Vitals reviewed.  Constitutional:      Appearance: Normal appearance.  HENT:     Nose: Nose normal.     Mouth/Throat:     Mouth: Mucous membranes are moist.  Eyes:     General: No scleral icterus.    Conjunctiva/sclera: Conjunctivae normal.  Cardiovascular:     Rate and Rhythm: Normal rate and regular rhythm.     Heart sounds: No murmur heard.   Pulmonary:     Effort: Pulmonary effort is normal.     Breath sounds: No stridor. No wheezing, rhonchi or rales.  Abdominal:     General: Abdomen is flat. There is no distension.      Palpations: Abdomen is soft.  Musculoskeletal:        General: Normal range of motion.     Cervical back: Neck supple.  Skin:    Findings: Erythema and rash present. No ecchymosis. Rash is macular. Rash is not crusting, nodular, papular, purpuric, pustular, scaling, urticarial or vesicular.          Comments: RLQ  Neurological:     General: No focal deficit present.     Mental Status: He is alert and oriented to person, place, and  time.     Lab Results  Component Value Date   WBC 14.9 (H) 03/21/2020   HGB 12.6 (L) 03/21/2020   HCT 39.3 03/21/2020   PLT 116.0 (L) 03/21/2020   GLUCOSE 94 03/21/2020   CHOL 214 (H) 08/04/2019   TRIG 180 (H) 08/04/2019   HDL 40 08/04/2019   LDLCALC 142 (H) 08/04/2019   ALT 18 04/02/2020   AST 27 04/02/2020   NA 140 03/21/2020   K 3.6 03/21/2020   CL 99 03/21/2020   CREATININE 0.68 03/21/2020   BUN 19 03/21/2020   CO2 35 (H) 03/21/2020   TSH 1.12 12/08/2019   PSA 1.2 09/21/2019   INR 1.4 (H) 02/16/2020   HGBA1C 4.4 (L) 09/15/2019   MICROALBUR 2.6 (H) 11/16/2018    MR Pelvis W Wo Contrast  Result Date: 03/22/2020 CLINICAL DATA:  Anal sore, considering nonhealing ulcer versus mass. EXAM: MRI PELVIS WITHOUT AND WITH CONTRAST TECHNIQUE: Multiplanar multisequence MR imaging of the pelvis was performed both before and after administration of intravenous contrast. CONTRAST:  51mL MULTIHANCE GADOBENATE DIMEGLUMINE 529 MG/ML IV SOLN COMPARISON:  09/26/2018 CT abdomen/pelvis. FINDINGS: Urinary Tract:  Normal bladder. Bowel: There is a lobulated masslike 2.2 x 1.3 x 1.3 cm focus centered in the 7 o'clock inferior right anal wall at the anal verge (series 9/image 29 and series 10/image 26) demonstrating mixed T2 signal intensity with heterogeneous enhancement. Otherwise unremarkable visualized pelvic bowel. Vascular/Lymphatic: No acute vascular abnormality. No pathologically enlarged lymph nodes in the visualized pelvis. Reproductive:  Mildly enlarged  prostate. Other:  No pelvic ascites or focal fluid collection. Musculoskeletal: There is a subacute nondisplaced oblique fracture in the lower sacrum with associated marrow edema and no appreciable discrete osseous lesion. IMPRESSION: 1. Lobulated 2.2 x 1.3 x 1.3 cm masslike focus centered in the 7 o'clock inferior right anal wall at the anal verge with mixed signal intensity and heterogeneous enhancement. Lesion is indeterminate with differential including chronic inflammatory lesion (chronic thick-walled anal fistula) or neoplasm. No evidence of abscess. Biopsy suggested. 2. Subacute nondisplaced oblique fracture in the lower sacrum with associated marrow edema, as seen on 03/05/2020 sacral radiographs. 3. Mildly enlarged prostate. Electronically Signed   By: Ilona Sorrel M.D.   On: 03/22/2020 11:17    Assessment & Plan:   Hridhaan was seen today for rash.  Diagnoses and all orders for this visit:  Tinea corporis-  -     ciclopirox (LOPROX) 0.77 % cream; Apply topically 2 (two) times daily.  Thiamine deficiency -     thiamine 100 MG tablet; Take 1 tablet (100 mg total) by mouth every other day.  Candida rash of groin  Allergic contact dermatitis due to drugs in contact with skin - He agrees to discontinue the use of mupirocin. Will treat with fluocinonide. -     fluocinonide cream (LIDEX) 0.05 %; Apply 1 application topically 2 (two) times daily.   I have discontinued [place] [employer] "Phil"'s docusate sodium. I am also having him start on fluocinonide cream. Additionally, I am having him maintain his predniSONE, folic acid, nitroGLYCERIN, leflunomide, acetaminophen, EPINEPHrine, ondansetron, sulfaSALAzine, Biktarvy, Prezcobix, ezetimibe, atorvastatin, metoprolol succinate, Savaysa, pregabalin, oxyCODONE, l-methylfolate-B6-B12, torsemide, [place]-Con M20, prednisoLONE acetate, valACYclovir, sulfamethoxazole-trimethoprim, escitalopram, Esbriet, tamsulosin, clindamycin, Restasis, thiamine, and  ciclopirox.  Meds ordered this encounter  Medications  . thiamine 100 MG tablet    Sig: Take 1 tablet (100 mg total) by mouth every other day.    Dispense:  45 tablet    Refill:  1  .  ciclopirox (LOPROX) 0.77 % cream    Sig: Apply topically 2 (two) times daily.    Dispense:  90 g    Refill:  1  . fluocinonide cream (LIDEX) 0.05 %    Sig: Apply 1 application topically 2 (two) times daily.    Dispense:  60 g    Refill:  0     Follow-up: Return in about 3 months (around 07/10/2020).  [name], MD

## 2020-04-12 LAB — ACID FAST CULTURE WITH REFLEXED SENSITIVITIES (MYCOBACTERIA): Acid Fast Culture: NEGATIVE

## 2020-04-13 ENCOUNTER — Encounter: Payer: Self-pay | Admitting: Internal Medicine

## 2020-04-13 ENCOUNTER — Other Ambulatory Visit: Payer: Self-pay | Admitting: Internal Medicine

## 2020-04-13 DIAGNOSIS — R600 Localized edema: Secondary | ICD-10-CM

## 2020-04-15 NOTE — Progress Notes (Signed)
Cardiology Office Note   Date:  04/17/2020   ID:  [name], DOB 1951-01-03, MRN [id number]  PCP:  [name], MD  Cardiologist:   [name], MD   Chief Complaint  Patient presents with  . Coronary Artery Disease      History of Present Illness: [name] is a 69 y.o. male who presented for followup of his known coronary disease.   He has had progressive dyspnea.  He did have a negative stress perfusion study for any evidence of reversible ischemia in 2016 although there was a fixed defect. His ejection fractions well-preserved. It was well preserved on echo with some tricuspid regurgitation.    He is followed for ILD and he had recent coccyx fracture.    From a cardiac standpoint he has had some palpitations.  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  He says his palpitations are not particularly symptomatic.  He does not have any presyncope or syncope.  He has chronic dyspnea and uses oxygen at night but he says this is stable.  He gets around in a motorized scooter for the most part with all of his orthopedic problems and arthritis.  He has had some weight loss because he is watching his diet.  He has had no lower extremity swelling.  He is not describing PND or orthopnea.  He is not had any chest pressure, neck or arm discomfort.   Past Medical History:  Diagnosis Date  . Allergy   . Anxiety   . Carotid artery occlusion    40-60% right ICA stenosis (09/2008)  . Cataract   . Chronic back pain   . CLL (chronic lymphoblastic leukemia) dx 2010   Followed at [facility] q25[facility], no current therapy   . Clotting disorder ([place])   . Coronary artery disease 2010   s/p CABG '10, sees Dr. Percival Spanish  . Depression   . Diverticulosis   . DVT, lower extremity, recurrent ([name]) 2008, 2009   LLE, chronic anticoag since 2009  . Esophagitis   . Fibromyalgia   . Gallstones   . GERD  (gastroesophageal reflux disease)   . Gout   . Gynecomastia, male   . H/O hiatal hernia 2008   surgery  . Hemorrhoids   . Hepatitis A yrs ago  . HIV infection ([place]) dx 1993  . Hypertension   . Impotence of organic origin   . Myocardial infarction ([place]) 2010    x 2  . Neuromuscular disorder ([facility])    neuropathy  . On home oxygen therapy    2L [place] at bedtime  . Osteoarthritis, knee    s/p B TKA  . Osteoporosis   . PONV (postoperative nausea and vomiting)   . Pulmonary fibrosis ([place]) 2017  . Rheumatoid arthritis(714.0) dx 2010   MTX, follows with rheum  . Seasonal allergies   . Secondary syphilis 07/24/14 dx   s/p 2 wks doxy  . Status post dilation of esophageal narrowing   . Stroke [facility]) 1969   TIA  . TIA (transient ischemic attack) 1997   mild residual L mouth droop  . Tubular adenoma of colon     Past Surgical History:  Procedure Laterality Date  . ACETABULAR REVISION Left 11/03/2019   REVISION LEFT HIP ACETABULAR COMPONENT (Left Hip)  . ANTERIOR HIP REVISION Left 08/17/2018   Procedure: ANTERIOR LEFT HIP REVISION;  Surgeon: Mcarthur Rossetti, MD;  Location: [place];  Service: Orthopedics;  Laterality: Left;  . ANTERIOR HIP REVISION Left 11/02/2018   Procedure: LEFT HIP CONSTRAINED LINER REVISION;  Surgeon: Mcarthur Rossetti, MD;  Location: [place];  Service: Orthopedics;  Laterality: Left;  . ANTERIOR HIP REVISION Left 11/03/2019   Procedure: REVISION LEFT HIP ACETABULAR COMPONENT;  Surgeon: Mcarthur Rossetti, MD;  Location: [place];  Service: Orthopedics;  Laterality: Left;  . BACK SURGERY  2010 & 2017  . BRONCHIAL WASHINGS  02/27/2020   Procedure: BRONCHIAL WASHINGS;  Surgeon: Brand Males, MD;  Location: [facility];  Service: Cardiopulmonary;;  . CHOLECYSTECTOMY    . COLONOSCOPY WITH PROPOFOL N/A 12/28/2012   Procedure: COLONOSCOPY WITH PROPOFOL;  Surgeon: Jerene Bears, MD;  [facility]: [facility];  Service: Gastroenterology;  Laterality: N/A;  .  CORONARY ARTERY BYPASS GRAFT  2010   triple bypass  . ESOPHAGOGASTRODUODENOSCOPY (EGD) WITH PROPOFOL N/A 12/28/2012   Procedure: ESOPHAGOGASTRODUODENOSCOPY (EGD) WITH PROPOFOL;  Surgeon: Jerene Bears, MD;  [facility];  Service: Gastroenterology;  Laterality: N/A;  . ESOPHAGOGASTRODUODENOSCOPY (EGD) WITH PROPOFOL N/A 03/15/2013   Procedure: ESOPHAGOGASTRODUODENOSCOPY (EGD) WITH PROPOFOL;  Surgeon: Jerene Bears, MD;  [facility];  Service: Gastroenterology;  Laterality: N/A;  . ESOPHAGOGASTRODUODENOSCOPY (EGD) WITH PROPOFOL N/A 02/07/2016   Procedure: ESOPHAGOGASTRODUODENOSCOPY (EGD) WITH PROPOFOL;  Surgeon: Jerene Bears, MD;  [facility];  Service: Gastroenterology;  Laterality: N/A;  . HARDWARE REMOVAL N/A 07/02/2012   Procedure: HARDWARE REMOVAL;  Surgeon: Elaina Hoops, MD;  Location: [place] [facility];  Service: Neurosurgery;  Laterality: N/A;  . HIATAL HERNIA REPAIR     wrap  . HIP CLOSED REDUCTION Left 08/15/2018   Procedure: CLOSED REDUCTION HIP;  Surgeon: Newt Minion, MD;  Location: [place];  Service: Orthopedics;  Laterality: Left;  . HIP CLOSED REDUCTION Left 08/28/2018   Procedure: CLOSED REDUCTION HIP FOR RECURRENT DISLOCATION AND DRESSING CHANGE;  Surgeon: Jessy Oto, MD;  Location: [place];  Service: Orthopedics;  Laterality: Left;  . HIP CLOSED REDUCTION Left 09/15/2018   Procedure: CLOSED REDUCTION HIP DISLOCATION;  Surgeon: Mcarthur Rossetti, MD;  Location: [place];  Service: Orthopedics;  Laterality: Left;  . HIP CLOSED REDUCTION Left 10/31/2018   Procedure: CLOSED REDUCTION LEFT TOTAL HIP;  Surgeon: Leandrew Koyanagi, MD;  Location: [place];  Service: Orthopedics;  Laterality: Left;  . INGUINAL HERNIA REPAIR Bilateral   . JOINT REPLACEMENT Left 1999  . KNEE ARTHROPLASTY  07/22/2011   Procedure: COMPUTER ASSISTED TOTAL KNEE ARTHROPLASTY;  Surgeon: Meredith Pel, MD;  Location: [place];  Service: Orthopedics;  Laterality: Right;  Right total knee  arthroplasty  . MANDIBLE SURGERY Bilateral    tmj  . REPLACEMENT TOTAL KNEE Bilateral   . ring around testicle hernia reapir  184 and 1986   x 2  . ROTATOR CUFF REPAIR Right   . SHOULDER SURGERY Left   . SPINE SURGERY  2010   "rod and screws", "failed", lopwer spine,   . stent to heart x 1  2010  . TONSILLECTOMY    . TOTAL HIP ARTHROPLASTY Left 08/13/2018   Procedure: LEFT TOTAL HIP ARTHROPLASTY ANTERIOR APPROACH;  Surgeon: Mcarthur Rossetti, MD;  Location: [place];  Service: Orthopedics;  Laterality: Left;  . TOTAL HIP ARTHROPLASTY Left 09/17/2018   Procedure: ANTERIOR LEFT HIP REVISION;  Surgeon: Mcarthur Rossetti, MD;  Location: [place];  Service: Orthopedics;  Laterality: Left;  . UMBILICAL HERNIA REPAIR  x 1  . varicose vein     stripping  . VIDEO BRONCHOSCOPY Bilateral 10/16/2015   Procedure: VIDEO BRONCHOSCOPY WITHOUT FLUORO;  Surgeon: Brand Males, MD;  [facility];  Service: Cardiopulmonary;  Laterality: Bilateral;  . VIDEO BRONCHOSCOPY N/A 02/27/2020   Procedure: VIDEO BRONCHOSCOPY;  Surgeon: Brand Males, MD;  [facility];  Service: Cardiopulmonary;  Laterality: N/A;  bronchial lavage     Current Outpatient Medications  Medication Sig Dispense Refill  . acetaminophen (TYLENOL) 500 MG tablet Take 500-1,000 mg by mouth every 6 (six) hours as needed for mild pain or moderate pain.     [name] atorvastatin (LIPITOR) 10 MG tablet Take 1 tablet (10 mg total) by mouth at bedtime. 30 tablet 9  . bictegravir-emtricitabine-tenofovir AF (BIKTARVY) 50-200-25 MG TABS tablet Take 1 tablet by mouth daily.     . ciclopirox (LOPROX) 0.77 % cream Apply topically 2 (two) times daily. 90 g 1  . clindamycin (CLEOCIN) 300 MG capsule Take 2 capsules (600 mg total) by mouth See admin instructions. Take two by mouth one hour before dental appointment, then two by mouth six hours after appointment 6 capsule 3  . darunavir-cobicistat (PREZCOBIX) 800-150 MG tablet Take  1 tablet by mouth daily with breakfast.     . EPINEPHrine (EPIPEN 2-PAK) 0.3 mg/0.3 mL IJ SOAJ injection Inject 0.3 mg into the muscle as needed for anaphylaxis.    [name] escitalopram (LEXAPRO) 20 MG tablet TAKE 1 TABLET BY MOUTH EVERY DAY 90 tablet 1  . ezetimibe (ZETIA) 10 MG tablet Take 1 tablet (10 mg total) by mouth daily. (Patient taking differently: Take 10 mg by mouth at bedtime. ) 90 tablet 3  . fluocinonide cream (LIDEX) 0.63 % Apply 1 application topically 2 (two) times daily. 60 g 0  . folic acid (FOLVITE) 1 MG tablet Take 1 mg by mouth daily.  11  . KLOR-CON M20 20 MEQ tablet TAKE 1 TABLET (20 MEQ TOTAL) BY MOUTH 3 (THREE) TIMES DAILY. (Patient taking differently: Take 20 mEq by mouth 2 (two) times daily. ) 270 tablet 0  . l-methylfolate-B6-B12 (METANX) 3-35-2 MG TABS tablet TAKE 1 TABLET BY MOUTH 2 (TWO) TIMES DAILY. 180 tablet 1  . leflunomide (ARAVA) 20 MG tablet Take 20 mg by mouth daily.     . metoprolol succinate (TOPROL-XL) 50 MG 24 hr tablet Take 1 tablet (50 mg total) by mouth daily. Take with or immediately following a meal. 30 tablet 0  . nitroGLYCERIN (NITROSTAT) 0.4 MG SL tablet Place 1 tablet (0.4 mg total) under the tongue every 5 (five) minutes as needed for chest pain. 25 tablet 2  . ondansetron (ZOFRAN) 4 MG tablet Take 4 mg by mouth every 6 (six) hours as needed for nausea.    [name] oxyCODONE (OXY IR/ROXICODONE) 5 MG immediate release tablet Take 1-2 tablets (5-10 mg total) by mouth every 6 (six) hours as needed for moderate pain (pain score 4-6). 30 tablet 0  . Pirfenidone (ESBRIET) 801 MG TABS Take 1 tablet by mouth in the morning, at noon, and at bedtime. 90 tablet 11  . prednisoLONE acetate (PRED FORTE) 1 % ophthalmic suspension Place 1 drop into both eyes in the morning and at bedtime.    . predniSONE (DELTASONE) 5 MG tablet Take 5 mg by mouth daily with breakfast.   1  . pregabalin (LYRICA) 200 MG capsule Take 1 capsule (200 mg total) by mouth 2 (two) times daily. 60  capsule 5  . RESTASIS 0.05 % ophthalmic  emulsion     . SAVAYSA 30 MG TABS tablet TAKE 1 TABLET (30 MG TOTAL) BY MOUTH DAILY. 30 tablet 5  . sulfamethoxazole-trimethoprim (BACTRIM DS) 800-160 MG tablet Take 1 tablet by mouth daily. 90 tablet 1  . sulfaSALAzine (AZULFIDINE) 500 MG tablet Take 500 mg by mouth in the morning and at bedtime.     . tamsulosin (FLOMAX) 0.4 MG CAPS capsule Take 1 capsule (0.4 mg total) by mouth daily after supper. 30 capsule 5  . thiamine 100 MG tablet Take 1 tablet (100 mg total) by mouth every other day. 45 tablet 1  . torsemide (DEMADEX) 20 MG tablet TAKE 1 TABLET BY MOUTH EVERY DAY 90 tablet 1  . valACYclovir (VALTREX) 500 MG tablet TAKE 1 TABLET BY MOUTH EVERY DAY 30 tablet 4   No current facility-administered medications for this visit.    Allergies:   Golimumab, Orencia [abatacept], Other, Peanut-containing drug products, Morphine, Oxycodone-acetaminophen, and Promethazine hcl   ROS:  Please see the history of present illness.   Otherwise, review of systems are positive for none.   All other systems are reviewed and negative.    PHYSICAL EXAM: VS:  BP 112/62   Pulse 64   Ht 5\' 7"  (1.702 m)   Wt 164 lb 12.8 oz (74.8 kg)   SpO2 97%   BMI 25.81 kg/m  , BMI Body mass index is 25.81 kg/m. GEN:  No distress NECK:  No jugular venous distention at 90 degrees, waveform within normal limits, carotid upstroke brisk and symmetric, no bruits, no thyromegaly LYMPHATICS:  No cervical adenopathy LUNGS:  Clear to auscultation bilaterally BACK:  No CVA tenderness CHEST:  Unremarkable HEART:  S1 and S2 within normal limits, no S3, no S4, no clicks, no rubs, no murmurs ABD:  Positive bowel sounds normal in frequency in pitch, no bruits, no rebound, no guarding, unable to assess midline mass or bruit with the patient seated. EXT:  2 plus pulses throughout, moderate edema, no cyanosis no clubbing SKIN:  No rashes no nodules NEURO:  Cranial nerves II through XII grossly  intact, motor grossly intact throughout PSYCH:  Cognitively intact, oriented to person place and time   EKG:  EKG is not ordered today.   Recent Labs: 12/08/2019: Magnesium 2.1; TSH 1.12 04/17/2020: ALT 18; BUN 15; Creatinine 0.88; Hemoglobin 11.7; Platelet Count 108; Potassium 3.8; Sodium 141    Lipid Panel    Component Value Date/Time   CHOL 214 (H) 08/04/2019 0950   TRIG 180 (H) 08/04/2019 0950   HDL 40 08/04/2019 0950   CHOLHDL 5.4 (H) 08/04/2019 0950   VLDL 30 08/14/2016 1616   LDLCALC 142 (H) 08/04/2019 0950      Wt Readings from Last 3 Encounters:  04/17/20 164 lb 12.8 oz (74.8 kg)  04/17/20 164 lb 8 oz (74.6 kg)  04/11/20 166 lb (75.3 kg)      Other studies Reviewed: Additional studies/ records that were reviewed today include: Pulmonary office records. Review of the above records demonstrates:  NA   ASSESSMENT AND PLAN:  Coronary artery disease:     The patient has no new chest discomfort.  No change in therapy.   Chronic dyspnea on exertion:     I think this is most likely related to his interstitial lung disease.  He has had no suggestion of heart failure.  No change in therapy.   History of DVT:    He is on chronic anticoagulation therapy.    Palpitation:  He is going to send me recordings of his increased heart rate.  At the last visit I did increase his metoprolol.  He is not having any sustained symptomatic tacky arrhythmias and is not particular bothered by these.  No change in therapy.   Current medicines are reviewed at length with the patient today.  The patient does not have concerns regarding medicines.  The following changes have been made:  None  Labs/ tests ordered today include: None  No orders of the defined types were placed in this encounter.    Disposition:   FU with me in one year.    Signed, [name], MD  04/17/2020 2:09 PM    [place]

## 2020-04-16 ENCOUNTER — Encounter: Payer: Self-pay | Admitting: Internal Medicine

## 2020-04-16 ENCOUNTER — Other Ambulatory Visit: Payer: Self-pay | Admitting: Oncology

## 2020-04-16 DIAGNOSIS — C9110 Chronic lymphocytic leukemia of B-cell type not having achieved remission: Secondary | ICD-10-CM

## 2020-04-17 ENCOUNTER — Inpatient Hospital Stay: Payer: Medicare Other

## 2020-04-17 ENCOUNTER — Inpatient Hospital Stay: Payer: Medicare Other | Attending: Oncology | Admitting: Oncology

## 2020-04-17 ENCOUNTER — Encounter: Payer: Self-pay | Admitting: Cardiology

## 2020-04-17 ENCOUNTER — Other Ambulatory Visit: Payer: Self-pay

## 2020-04-17 ENCOUNTER — Ambulatory Visit: Payer: Medicare Other | Admitting: Orthopaedic Surgery

## 2020-04-17 ENCOUNTER — Ambulatory Visit (INDEPENDENT_AMBULATORY_CARE_PROVIDER_SITE_OTHER): Payer: Medicare Other | Admitting: Cardiology

## 2020-04-17 VITALS — BP 115/76 | HR 60 | Temp 98.1°F | Resp 18 | Ht 67.0 in | Wt 164.5 lb

## 2020-04-17 VITALS — BP 112/62 | HR 64 | Ht 67.0 in | Wt 164.8 lb

## 2020-04-17 DIAGNOSIS — R600 Localized edema: Secondary | ICD-10-CM | POA: Diagnosis not present

## 2020-04-17 DIAGNOSIS — R002 Palpitations: Secondary | ICD-10-CM

## 2020-04-17 DIAGNOSIS — I825Y9 Chronic embolism and thrombosis of unspecified deep veins of unspecified proximal lower extremity: Secondary | ICD-10-CM

## 2020-04-17 DIAGNOSIS — Z21 Asymptomatic human immunodeficiency virus [HIV] infection status: Secondary | ICD-10-CM | POA: Diagnosis not present

## 2020-04-17 DIAGNOSIS — I2510 Atherosclerotic heart disease of native coronary artery without angina pectoris: Secondary | ICD-10-CM

## 2020-04-17 DIAGNOSIS — J849 Interstitial pulmonary disease, unspecified: Secondary | ICD-10-CM

## 2020-04-17 DIAGNOSIS — C9110 Chronic lymphocytic leukemia of B-cell type not having achieved remission: Secondary | ICD-10-CM

## 2020-04-17 LAB — CBC WITH DIFFERENTIAL (CANCER CENTER ONLY)
Abs Immature Granulocytes: 0 10*3/uL (ref 0.00–0.07)
Basophils Absolute: 0 10*3/uL (ref 0.0–0.1)
Basophils Relative: 0 %
Eosinophils Absolute: 0 10*3/uL (ref 0.0–0.5)
Eosinophils Relative: 0 %
HCT: 37.8 % — ABNORMAL LOW (ref 39.0–52.0)
Hemoglobin: 11.7 g/dL — ABNORMAL LOW (ref 13.0–17.0)
Lymphocytes Relative: 78 %
Lymphs Abs: 12.9 10*3/uL — ABNORMAL HIGH (ref 0.7–4.0)
MCH: 34.4 pg — ABNORMAL HIGH (ref 26.0–34.0)
MCHC: 31 g/dL (ref 30.0–36.0)
MCV: 111.2 fL — ABNORMAL HIGH (ref 80.0–100.0)
Monocytes Absolute: 1.8 10*3/uL — ABNORMAL HIGH (ref 0.1–1.0)
Monocytes Relative: 11 %
Neutro Abs: 1.8 10*3/uL (ref 1.7–7.7)
Neutrophils Relative %: 11 %
Platelet Count: 108 10*3/uL — ABNORMAL LOW (ref 150–400)
RBC: 3.4 MIL/uL — ABNORMAL LOW (ref 4.22–5.81)
RDW: 15.9 % — ABNORMAL HIGH (ref 11.5–15.5)
WBC Count: 16.6 10*3/uL — ABNORMAL HIGH (ref 4.0–10.5)
nRBC: 0 % (ref 0.0–0.2)

## 2020-04-17 LAB — CMP (CANCER CENTER ONLY)
ALT: 18 U/L (ref 0–44)
AST: 24 U/L (ref 15–41)
Albumin: 3.5 g/dL (ref 3.5–5.0)
Alkaline Phosphatase: 87 U/L (ref 38–126)
Anion gap: 8 (ref 5–15)
BUN: 15 mg/dL (ref 8–23)
CO2: 29 mmol/L (ref 22–32)
Calcium: 8.6 mg/dL — ABNORMAL LOW (ref 8.9–10.3)
Chloride: 104 mmol/L (ref 98–111)
Creatinine: 0.88 mg/dL (ref 0.61–1.24)
GFR, Estimated: >60 mL/min (ref >60)
Glucose, Bld: 90 mg/dL (ref 70–99)
Potassium: 3.8 mmol/L (ref 3.5–5.1)
Sodium: 141 mmol/L (ref 135–145)
Total Bilirubin: 0.6 mg/dL (ref 0.3–1.2)
Total Protein: 5.8 g/dL — ABNORMAL LOW (ref 6.5–8.1)

## 2020-04-17 NOTE — Patient Instructions (Signed)
Medication Instructions:  NO CHANGES *If you need a refill on your cardiac medications before your next appointment, please call your pharmacy*  Lab Work: NONE ORDERED AT THIS VISIT  Testing/Procedures: NONE ORDERED AT THIS VISIT  Follow-Up: At [facility], you and your health needs are our priority.  As part of our continuing mission to provide you with exceptional heart care, we have created designated Provider Care Teams.  These Care Teams include your primary Cardiologist (physician) and Advanced Practice Providers (APPs -  Physician Assistants and Nurse Practitioners) who all work together to provide you with the care you need, when you need it.    Your next appointment:   12 month(s)  You will receive a reminder letter in the mail two months in advance. If you don't receive a letter, please call our office to schedule the follow-up appointment.  The format for your next appointment:   In Person  Provider:   [name], MD

## 2020-04-17 NOTE — Progress Notes (Signed)
Hematology and Oncology Follow Up Visit  [name] [id number] Aug 29, 1950 70 y.o. 04/17/2020 10:00 AM [name], MDJones, [name], MD   Principle Diagnosis: 69 year old man with stage 0 CLL diagnosed in 2007.  He presented with lymphocytosis without any lymphadenopathy.  Current therapy: Active surveillance.  Interim History:  [name] is here for a follow-up visit.  Since last visit, he reports no major changes in his health.  He continues to have worsening arthritis and currently using a motorized scooter for ambulation.  He denies any recent falls or syncope.  He denies any hospitalization or illnesses.  He did require a repeat bronchoscopy for his interstitial lung disease.  His quality of life remains maintained.  He denies any fevers chills or constitutional symptoms.      Medications: Updated on review. Current Outpatient Medications  Medication Sig Dispense Refill  . acetaminophen (TYLENOL) 500 MG tablet Take 500-1,000 mg by mouth every 6 (six) hours as needed for mild pain or moderate pain.     [name] atorvastatin (LIPITOR) 10 MG tablet Take 1 tablet (10 mg total) by mouth at bedtime. 30 tablet 9  . bictegravir-emtricitabine-tenofovir AF (BIKTARVY) 50-200-25 MG TABS tablet Take 1 tablet by mouth daily.     . ciclopirox (LOPROX) 0.77 % cream Apply topically 2 (two) times daily. 90 g 1  . clindamycin (CLEOCIN) 300 MG capsule Take 2 capsules (600 mg total) by mouth See admin instructions. Take two by mouth one hour before dental appointment, then two by mouth six hours after appointment 6 capsule 3  . darunavir-cobicistat (PREZCOBIX) 800-150 MG tablet Take 1 tablet by mouth daily with breakfast.     . EPINEPHrine (EPIPEN 2-PAK) 0.3 mg/0.3 mL IJ SOAJ injection Inject 0.3 mg into the muscle as needed for anaphylaxis.    [name] escitalopram (LEXAPRO) 20 MG tablet TAKE 1 TABLET BY MOUTH EVERY DAY 90 tablet 1  . ezetimibe (ZETIA) 10 MG tablet Take 1 tablet (10 mg total) by mouth daily.  (Patient taking differently: Take 10 mg by mouth at bedtime. ) 90 tablet 3  . fluocinonide cream (LIDEX) 3.38 % Apply 1 application topically 2 (two) times daily. 60 g 0  . folic acid (FOLVITE) 1 MG tablet Take 1 mg by mouth daily.  11  . KLOR-CON M20 20 MEQ tablet TAKE 1 TABLET (20 MEQ TOTAL) BY MOUTH 3 (THREE) TIMES DAILY. (Patient taking differently: Take 20 mEq by mouth 2 (two) times daily. ) 270 tablet 0  . l-methylfolate-B6-B12 (METANX) 3-35-2 MG TABS tablet TAKE 1 TABLET BY MOUTH 2 (TWO) TIMES DAILY. 180 tablet 1  . leflunomide (ARAVA) 20 MG tablet Take 20 mg by mouth daily.     . metoprolol succinate (TOPROL-XL) 50 MG 24 hr tablet Take 1 tablet (50 mg total) by mouth daily. Take with or immediately following a meal. 30 tablet 0  . nitroGLYCERIN (NITROSTAT) 0.4 MG SL tablet Place 1 tablet (0.4 mg total) under the tongue every 5 (five) minutes as needed for chest pain. 25 tablet 2  . ondansetron (ZOFRAN) 4 MG tablet Take 4 mg by mouth every 6 (six) hours as needed for nausea.    [name] oxyCODONE (OXY IR/ROXICODONE) 5 MG immediate release tablet Take 1-2 tablets (5-10 mg total) by mouth every 6 (six) hours as needed for moderate pain (pain score 4-6). 30 tablet 0  . Pirfenidone (ESBRIET) 801 MG TABS Take 1 tablet by mouth in the morning, at noon, and at bedtime. 90 tablet 11  .  prednisoLONE acetate (PRED FORTE) 1 % ophthalmic suspension Place 1 drop into both eyes in the morning and at bedtime.    . predniSONE (DELTASONE) 5 MG tablet Take 5 mg by mouth daily with breakfast.   1  . pregabalin (LYRICA) 200 MG capsule Take 1 capsule (200 mg total) by mouth 2 (two) times daily. 60 capsule 5  . RESTASIS 0.05 % ophthalmic emulsion     . SAVAYSA 30 MG TABS tablet TAKE 1 TABLET (30 MG TOTAL) BY MOUTH DAILY. 30 tablet 5  . sulfamethoxazole-trimethoprim (BACTRIM DS) 800-160 MG tablet Take 1 tablet by mouth daily. 90 tablet 1  . sulfaSALAzine (AZULFIDINE) 500 MG tablet Take 500 mg by mouth in the morning and  at bedtime.     . tamsulosin (FLOMAX) 0.4 MG CAPS capsule Take 1 capsule (0.4 mg total) by mouth daily after supper. 30 capsule 5  . thiamine 100 MG tablet Take 1 tablet (100 mg total) by mouth every other day. 45 tablet 1  . torsemide (DEMADEX) 20 MG tablet TAKE 1 TABLET BY MOUTH EVERY DAY 90 tablet 1  . valACYclovir (VALTREX) 500 MG tablet TAKE 1 TABLET BY MOUTH EVERY DAY 30 tablet 4   No current facility-administered medications for this visit.     Allergies:  Allergies  Allergen Reactions  . Golimumab Anaphylaxis    Simponi ARIA  . Orencia [Abatacept] Anaphylaxis  . Other Anaphylaxis and Hives    Pecans  . Peanut-Containing Drug Products Anaphylaxis, Hives and Swelling    Swelling of throat  . Morphine Other (See Comments)    Severe headache- Can tolerate Dilaudid, however  . Oxycodone-Acetaminophen Other (See Comments)    Headache 6.22.2020 patient is currently taking and tolerating  . Promethazine Hcl Other (See Comments)    Makes him feel "drunk" at higher strengths       Physical Exam:    Blood pressure 115/76, pulse 60, temperature 98.1 F (36.7 C), temperature source Tympanic, resp. rate 18, height 5\' 7"  (1.702 m), weight 164 lb 8 oz (74.6 kg), SpO2 100 %.    ECOG: 1    General appearance: Alert, awake without any distress. Head: Atraumatic without abnormalities Oropharynx: Without any thrush or ulcers. Eyes: No scleral icterus. Lymph nodes: No lymphadenopathy noted in the cervical, supraclavicular, or axillary nodes Heart:regular rate and rhythm, without any murmurs or gallops.   Lung: Clear to auscultation without any rhonchi, wheezes or dullness to percussion. Abdomin: Soft, nontender without any shifting dullness or ascites. Musculoskeletal: No clubbing or cyanosis. Neurological: No motor or sensory deficits. Skin: No rashes or lesions.      Lab Results: Lab Results  Component Value Date   WBC 16.6 (H) 04/17/2020   HGB 11.7 (L) 04/17/2020    HCT 37.8 (L) 04/17/2020   MCV 111.2 (H) 04/17/2020   PLT 108 (L) 04/17/2020     Chemistry      Component Value Date/Time   NA 140 03/21/2020 1021   NA 142 06/06/2019 1156   NA 140 08/13/2016 0919   K 3.6 03/21/2020 1021   K 3.7 08/13/2016 0919   CL 99 03/21/2020 1021   CO2 35 (H) 03/21/2020 1021   CO2 27 08/13/2016 0919   BUN 19 03/21/2020 1021   BUN 17 06/06/2019 1156   BUN 18.7 08/13/2016 0919   CREATININE 0.68 03/21/2020 1021   CREATININE 0.89 12/08/2019 1429   CREATININE 1.1 08/13/2016 0919   GLU 92 07/09/2016 0000      Component Value Date/Time  CALCIUM 8.3 (L) 03/21/2020 1021   CALCIUM 8.8 08/13/2016 0919   ALKPHOS 91 04/02/2020 1049   ALKPHOS 114 08/13/2016 0919   AST 27 04/02/2020 1049   AST 16 08/16/2019 0946   AST 21 08/13/2016 0919   ALT 18 04/02/2020 1049   ALT 12 08/16/2019 0946   ALT 18 08/13/2016 0919   BILITOT 0.7 04/02/2020 1049   BILITOT 0.7 08/16/2019 0946   BILITOT 0.59 08/13/2016 0919        Impression and Plan:  69 year old man with  1.  Stage 0 CLL diagnosed in 2007.  He was found to have CD38 positive, ZAP 70 positive.  The natural course of his disease was reviewed and imaging studies in the last few months were discussed.  He had a CT scan of the chest which I personally reviewed which did not show any lymphadenopathy.  He completed MRI of the pelvis which showed no pelvic or abdominal adenopathy.  Laboratory data reviewed today does show increase in his white cell count of lymphocytosis but remains asymptomatic.  Treatment options moving forward were discussed.  Oral targeted therapy with ibrutinib versus systemic chemotherapy were discussed.  At this time I see no indication for treatment given the fact that he has no lymphadenopathy and very modest rise in his white cell count.  I recommended continued surveillance at this time.   2.  Dyspnea on exertion: Related to interstitial lung disease and continues to follow with pulmonary  medicine.   3.  HIV: Disease is under control and follows plan Dr. [name].  4. Follow-up: In 6 months for a follow-up visit.  30  minutes were spent on this encounter.  The time was dedicated to reviewing laboratory data, imaging studies and future treatment options and plan of care.    Zola Button, MD 12/7/202110:00 AM

## 2020-04-18 ENCOUNTER — Ambulatory Visit: Payer: Medicare Other | Admitting: Orthopaedic Surgery

## 2020-04-23 DIAGNOSIS — J441 Chronic obstructive pulmonary disease with (acute) exacerbation: Secondary | ICD-10-CM | POA: Diagnosis not present

## 2020-04-23 DIAGNOSIS — M86152 Other acute osteomyelitis, left femur: Secondary | ICD-10-CM | POA: Diagnosis not present

## 2020-04-23 DIAGNOSIS — I2510 Atherosclerotic heart disease of native coronary artery without angina pectoris: Secondary | ICD-10-CM | POA: Diagnosis not present

## 2020-04-23 DIAGNOSIS — K529 Noninfective gastroenteritis and colitis, unspecified: Secondary | ICD-10-CM | POA: Diagnosis not present

## 2020-04-23 DIAGNOSIS — I10 Essential (primary) hypertension: Secondary | ICD-10-CM | POA: Diagnosis not present

## 2020-04-23 DIAGNOSIS — Z885 Allergy status to narcotic agent status: Secondary | ICD-10-CM | POA: Diagnosis not present

## 2020-04-23 DIAGNOSIS — J9811 Atelectasis: Secondary | ICD-10-CM | POA: Diagnosis not present

## 2020-04-23 DIAGNOSIS — C9110 Chronic lymphocytic leukemia of B-cell type not having achieved remission: Secondary | ICD-10-CM | POA: Diagnosis not present

## 2020-04-23 DIAGNOSIS — Z880 Allergy status to penicillin: Secondary | ICD-10-CM | POA: Diagnosis not present

## 2020-04-23 DIAGNOSIS — R079 Chest pain, unspecified: Secondary | ICD-10-CM | POA: Diagnosis not present

## 2020-04-23 DIAGNOSIS — R059 Cough, unspecified: Secondary | ICD-10-CM | POA: Diagnosis not present

## 2020-04-23 DIAGNOSIS — B974 Respiratory syncytial virus as the cause of diseases classified elsewhere: Secondary | ICD-10-CM | POA: Diagnosis not present

## 2020-04-23 DIAGNOSIS — Z96642 Presence of left artificial hip joint: Secondary | ICD-10-CM | POA: Diagnosis not present

## 2020-04-23 DIAGNOSIS — Z20822 Contact with and (suspected) exposure to covid-19: Secondary | ICD-10-CM | POA: Diagnosis not present

## 2020-04-23 DIAGNOSIS — Z888 Allergy status to other drugs, medicaments and biological substances status: Secondary | ICD-10-CM | POA: Diagnosis not present

## 2020-04-23 DIAGNOSIS — R1111 Vomiting without nausea: Secondary | ICD-10-CM | POA: Diagnosis not present

## 2020-04-23 DIAGNOSIS — Z882 Allergy status to sulfonamides status: Secondary | ICD-10-CM | POA: Diagnosis not present

## 2020-04-23 DIAGNOSIS — Z792 Long term (current) use of antibiotics: Secondary | ICD-10-CM | POA: Diagnosis not present

## 2020-04-23 DIAGNOSIS — R1110 Vomiting, unspecified: Secondary | ICD-10-CM | POA: Diagnosis not present

## 2020-04-23 DIAGNOSIS — K5790 Diverticulosis of intestine, part unspecified, without perforation or abscess without bleeding: Secondary | ICD-10-CM | POA: Diagnosis not present

## 2020-04-23 DIAGNOSIS — R0902 Hypoxemia: Secondary | ICD-10-CM | POA: Diagnosis not present

## 2020-04-23 DIAGNOSIS — M009 Pyogenic arthritis, unspecified: Secondary | ICD-10-CM | POA: Diagnosis not present

## 2020-04-23 DIAGNOSIS — J90 Pleural effusion, not elsewhere classified: Secondary | ICD-10-CM | POA: Diagnosis not present

## 2020-04-23 DIAGNOSIS — M00852 Arthritis due to other bacteria, left hip: Secondary | ICD-10-CM | POA: Diagnosis not present

## 2020-04-23 DIAGNOSIS — M86159 Other acute osteomyelitis, unspecified femur: Secondary | ICD-10-CM | POA: Diagnosis not present

## 2020-04-23 DIAGNOSIS — J849 Interstitial pulmonary disease, unspecified: Secondary | ICD-10-CM | POA: Diagnosis not present

## 2020-04-23 DIAGNOSIS — R519 Headache, unspecified: Secondary | ICD-10-CM | POA: Diagnosis not present

## 2020-04-23 DIAGNOSIS — I252 Old myocardial infarction: Secondary | ICD-10-CM | POA: Diagnosis not present

## 2020-04-23 DIAGNOSIS — R0602 Shortness of breath: Secondary | ICD-10-CM | POA: Diagnosis not present

## 2020-04-23 DIAGNOSIS — R112 Nausea with vomiting, unspecified: Secondary | ICD-10-CM | POA: Diagnosis not present

## 2020-04-23 DIAGNOSIS — Z9049 Acquired absence of other specified parts of digestive tract: Secondary | ICD-10-CM | POA: Diagnosis not present

## 2020-04-23 DIAGNOSIS — R110 Nausea: Secondary | ICD-10-CM | POA: Diagnosis not present

## 2020-04-23 DIAGNOSIS — B20 Human immunodeficiency virus [HIV] disease: Secondary | ICD-10-CM | POA: Diagnosis not present

## 2020-04-25 ENCOUNTER — Telehealth: Payer: Self-pay

## 2020-04-25 NOTE — Telephone Encounter (Signed)
Patient left message that he is in hospital in [place] at [facility].  He has a hip infection.  Would like to talk to Dr. Ninfa Linden. [phone]

## 2020-04-26 ENCOUNTER — Inpatient Hospital Stay (HOSPITAL_COMMUNITY): Payer: Medicare Other

## 2020-04-26 ENCOUNTER — Encounter (HOSPITAL_COMMUNITY): Payer: Self-pay | Admitting: Internal Medicine

## 2020-04-26 ENCOUNTER — Other Ambulatory Visit: Payer: Self-pay

## 2020-04-26 ENCOUNTER — Inpatient Hospital Stay (HOSPITAL_COMMUNITY)
Admission: AD | Admit: 2020-04-26 | Discharge: 2020-05-01 | DRG: 481 | Disposition: A | Discharge status: Home Health | Payer: Medicare Other | Source: Other Acute Inpatient Hospital | Attending: Internal Medicine | Admitting: Internal Medicine

## 2020-04-26 DIAGNOSIS — M869 Osteomyelitis, unspecified: Secondary | ICD-10-CM

## 2020-04-26 DIAGNOSIS — L89151 Pressure ulcer of sacral region, stage 1: Secondary | ICD-10-CM | POA: Diagnosis present

## 2020-04-26 DIAGNOSIS — Y831 Surgical operation with implant of artificial internal device as the cause of abnormal reaction of the patient, or of later complication, without mention of misadventure at the time of the procedure: Secondary | ICD-10-CM | POA: Diagnosis present

## 2020-04-26 DIAGNOSIS — T8452XA Infection and inflammatory reaction due to internal left hip prosthesis, initial encounter: Secondary | ICD-10-CM | POA: Diagnosis not present

## 2020-04-26 DIAGNOSIS — Z7901 Long term (current) use of anticoagulants: Secondary | ICD-10-CM | POA: Diagnosis not present

## 2020-04-26 DIAGNOSIS — T8452XD Infection and inflammatory reaction due to internal left hip prosthesis, subsequent encounter: Secondary | ICD-10-CM

## 2020-04-26 DIAGNOSIS — I69392 Facial weakness following cerebral infarction: Secondary | ICD-10-CM

## 2020-04-26 DIAGNOSIS — B974 Respiratory syncytial virus as the cause of diseases classified elsewhere: Secondary | ICD-10-CM | POA: Diagnosis present

## 2020-04-26 DIAGNOSIS — M86159 Other acute osteomyelitis, unspecified femur: Secondary | ICD-10-CM | POA: Diagnosis not present

## 2020-04-26 DIAGNOSIS — I2510 Atherosclerotic heart disease of native coronary artery without angina pectoris: Secondary | ICD-10-CM | POA: Diagnosis present

## 2020-04-26 DIAGNOSIS — J441 Chronic obstructive pulmonary disease with (acute) exacerbation: Secondary | ICD-10-CM | POA: Diagnosis not present

## 2020-04-26 DIAGNOSIS — B9689 Other specified bacterial agents as the cause of diseases classified elsewhere: Secondary | ICD-10-CM | POA: Diagnosis not present

## 2020-04-26 DIAGNOSIS — M86252 Subacute osteomyelitis, left femur: Secondary | ICD-10-CM | POA: Diagnosis not present

## 2020-04-26 DIAGNOSIS — Z833 Family history of diabetes mellitus: Secondary | ICD-10-CM

## 2020-04-26 DIAGNOSIS — Z96649 Presence of unspecified artificial hip joint: Secondary | ICD-10-CM

## 2020-04-26 DIAGNOSIS — Z471 Aftercare following joint replacement surgery: Secondary | ICD-10-CM | POA: Diagnosis not present

## 2020-04-26 DIAGNOSIS — Z8042 Family history of malignant neoplasm of prostate: Secondary | ICD-10-CM

## 2020-04-26 DIAGNOSIS — Z9981 Dependence on supplemental oxygen: Secondary | ICD-10-CM

## 2020-04-26 DIAGNOSIS — Z87828 Personal history of other (healed) physical injury and trauma: Secondary | ICD-10-CM | POA: Diagnosis not present

## 2020-04-26 DIAGNOSIS — Y792 Prosthetic and other implants, materials and accessory orthopedic devices associated with adverse incidents: Secondary | ICD-10-CM | POA: Diagnosis not present

## 2020-04-26 DIAGNOSIS — I252 Old myocardial infarction: Secondary | ICD-10-CM | POA: Diagnosis not present

## 2020-04-26 DIAGNOSIS — B20 Human immunodeficiency virus [HIV] disease: Secondary | ICD-10-CM | POA: Diagnosis not present

## 2020-04-26 DIAGNOSIS — M549 Dorsalgia, unspecified: Secondary | ICD-10-CM | POA: Diagnosis present

## 2020-04-26 DIAGNOSIS — Z91010 Allergy to peanuts: Secondary | ICD-10-CM

## 2020-04-26 DIAGNOSIS — Z96642 Presence of left artificial hip joint: Secondary | ICD-10-CM | POA: Diagnosis not present

## 2020-04-26 DIAGNOSIS — M8618 Other acute osteomyelitis, other site: Secondary | ICD-10-CM | POA: Diagnosis not present

## 2020-04-26 DIAGNOSIS — G8929 Other chronic pain: Secondary | ICD-10-CM | POA: Diagnosis present

## 2020-04-26 DIAGNOSIS — R64 Cachexia: Secondary | ICD-10-CM | POA: Diagnosis not present

## 2020-04-26 DIAGNOSIS — Z86718 Personal history of other venous thrombosis and embolism: Secondary | ICD-10-CM | POA: Diagnosis not present

## 2020-04-26 DIAGNOSIS — Z8371 Family history of colonic polyps: Secondary | ICD-10-CM

## 2020-04-26 DIAGNOSIS — Z8249 Family history of ischemic heart disease and other diseases of the circulatory system: Secondary | ICD-10-CM

## 2020-04-26 DIAGNOSIS — Z9049 Acquired absence of other specified parts of digestive tract: Secondary | ICD-10-CM | POA: Diagnosis not present

## 2020-04-26 DIAGNOSIS — M40209 Unspecified kyphosis, site unspecified: Secondary | ICD-10-CM | POA: Diagnosis present

## 2020-04-26 DIAGNOSIS — E876 Hypokalemia: Secondary | ICD-10-CM | POA: Diagnosis present

## 2020-04-26 DIAGNOSIS — K529 Noninfective gastroenteritis and colitis, unspecified: Secondary | ICD-10-CM | POA: Diagnosis not present

## 2020-04-26 DIAGNOSIS — Z6824 Body mass index (BMI) 24.0-24.9, adult: Secondary | ICD-10-CM | POA: Diagnosis not present

## 2020-04-26 DIAGNOSIS — Z981 Arthrodesis status: Secondary | ICD-10-CM

## 2020-04-26 DIAGNOSIS — I10 Essential (primary) hypertension: Secondary | ICD-10-CM | POA: Diagnosis present

## 2020-04-26 DIAGNOSIS — Z951 Presence of aortocoronary bypass graft: Secondary | ICD-10-CM

## 2020-04-26 DIAGNOSIS — M810 Age-related osteoporosis without current pathological fracture: Secondary | ICD-10-CM | POA: Diagnosis present

## 2020-04-26 DIAGNOSIS — Z885 Allergy status to narcotic agent status: Secondary | ICD-10-CM | POA: Diagnosis not present

## 2020-04-26 DIAGNOSIS — K219 Gastro-esophageal reflux disease without esophagitis: Secondary | ICD-10-CM | POA: Diagnosis present

## 2020-04-26 DIAGNOSIS — Z9889 Other specified postprocedural states: Secondary | ICD-10-CM | POA: Diagnosis not present

## 2020-04-26 DIAGNOSIS — G629 Polyneuropathy, unspecified: Secondary | ICD-10-CM | POA: Diagnosis present

## 2020-04-26 DIAGNOSIS — M069 Rheumatoid arthritis, unspecified: Secondary | ICD-10-CM | POA: Diagnosis not present

## 2020-04-26 DIAGNOSIS — J9611 Chronic respiratory failure with hypoxia: Secondary | ICD-10-CM | POA: Diagnosis not present

## 2020-04-26 DIAGNOSIS — Z7952 Long term (current) use of systemic steroids: Secondary | ICD-10-CM

## 2020-04-26 DIAGNOSIS — Z96653 Presence of artificial knee joint, bilateral: Secondary | ICD-10-CM | POA: Diagnosis present

## 2020-04-26 DIAGNOSIS — J849 Interstitial pulmonary disease, unspecified: Secondary | ICD-10-CM | POA: Diagnosis not present

## 2020-04-26 DIAGNOSIS — F418 Other specified anxiety disorders: Secondary | ICD-10-CM | POA: Diagnosis not present

## 2020-04-26 DIAGNOSIS — T8459XD Infection and inflammatory reaction due to other internal joint prosthesis, subsequent encounter: Secondary | ICD-10-CM | POA: Diagnosis not present

## 2020-04-26 DIAGNOSIS — G4730 Sleep apnea, unspecified: Secondary | ICD-10-CM | POA: Diagnosis not present

## 2020-04-26 DIAGNOSIS — B952 Enterococcus as the cause of diseases classified elsewhere: Secondary | ICD-10-CM | POA: Diagnosis present

## 2020-04-26 DIAGNOSIS — Z823 Family history of stroke: Secondary | ICD-10-CM

## 2020-04-26 DIAGNOSIS — Z803 Family history of malignant neoplasm of breast: Secondary | ICD-10-CM

## 2020-04-26 DIAGNOSIS — Z825 Family history of asthma and other chronic lower respiratory diseases: Secondary | ICD-10-CM

## 2020-04-26 DIAGNOSIS — Z79899 Other long term (current) drug therapy: Secondary | ICD-10-CM

## 2020-04-26 DIAGNOSIS — M00852 Arthritis due to other bacteria, left hip: Secondary | ICD-10-CM | POA: Diagnosis not present

## 2020-04-26 DIAGNOSIS — T8459XA Infection and inflammatory reaction due to other internal joint prosthesis, initial encounter: Secondary | ICD-10-CM

## 2020-04-26 DIAGNOSIS — L8990 Pressure ulcer of unspecified site, unspecified stage: Secondary | ICD-10-CM | POA: Insufficient documentation

## 2020-04-26 DIAGNOSIS — Z83438 Family history of other disorder of lipoprotein metabolism and other lipidemia: Secondary | ICD-10-CM

## 2020-04-26 DIAGNOSIS — Z888 Allergy status to other drugs, medicaments and biological substances status: Secondary | ICD-10-CM

## 2020-04-26 MED ORDER — TAMSULOSIN HCL 0.4 MG PO CAPS
0.4000 mg | ORAL_CAPSULE | Freq: Every day | ORAL | Status: DC
  Administered 2020-04-28 – 2020-04-30 (×3): 0.4 mg via ORAL
  Filled 2020-04-26 (×3): qty 1

## 2020-04-26 MED ORDER — ACETAMINOPHEN 500 MG PO TABS: 500.0000 mg | ORAL_TABLET | Freq: Four times a day (QID) | ORAL | Status: DC | PRN

## 2020-04-26 MED ORDER — PREDNISOLONE ACETATE 1 % OP SUSP: 1.0000 [drp] | OPHTHALMIC | Status: DC

## 2020-04-26 MED ORDER — ATORVASTATIN CALCIUM 10 MG PO TABS
10.0000 mg | ORAL_TABLET | Freq: Every day | ORAL | Status: DC
  Administered 2020-04-26 – 2020-04-30 (×5): 10 mg via ORAL
  Filled 2020-04-26 (×5): qty 1

## 2020-04-26 MED ORDER — PREGABALIN 100 MG PO CAPS
200.0000 mg | ORAL_CAPSULE | Freq: Two times a day (BID) | ORAL | Status: DC
  Administered 2020-04-26 – 2020-05-01 (×10): 200 mg via ORAL
  Filled 2020-04-26 (×10): qty 2

## 2020-04-26 MED ORDER — FLUOCINONIDE 0.05 % EX CREA
1.0000 "application " | TOPICAL_CREAM | Freq: Two times a day (BID) | CUTANEOUS | Status: DC
  Administered 2020-04-27 – 2020-04-30 (×3): 1 via TOPICAL
  Filled 2020-04-26: qty 15

## 2020-04-26 MED ORDER — SULFASALAZINE 500 MG PO TABS
500.0000 mg | ORAL_TABLET | Freq: Two times a day (BID) | ORAL | Status: DC
  Administered 2020-04-26 – 2020-05-01 (×10): 500 mg via ORAL
  Filled 2020-04-26 (×11): qty 1

## 2020-04-26 MED ORDER — ONDANSETRON HCL 4 MG PO TABS: 4.0000 mg | ORAL_TABLET | Freq: Four times a day (QID) | ORAL | Status: DC | PRN

## 2020-04-26 MED ORDER — LEFLUNOMIDE 20 MG PO TABS
20.0000 mg | ORAL_TABLET | Freq: Every day | ORAL | Status: DC
  Administered 2020-04-27 – 2020-05-01 (×5): 20 mg via ORAL
  Filled 2020-04-26 (×5): qty 1

## 2020-04-26 MED ORDER — PREDNISONE 5 MG PO TABS
5.0000 mg | ORAL_TABLET | Freq: Every day | ORAL | Status: DC
  Administered 2020-04-27 – 2020-05-01 (×5): 5 mg via ORAL
  Filled 2020-04-26 (×5): qty 1

## 2020-04-26 MED ORDER — CYCLOSPORINE 0.05 % OP EMUL
1.0000 [drp] | Freq: Two times a day (BID) | OPHTHALMIC | Status: DC
  Administered 2020-04-26 – 2020-05-01 (×10): 1 [drp] via OPHTHALMIC
  Filled 2020-04-26 (×11): qty 1

## 2020-04-26 MED ORDER — THIAMINE HCL 100 MG PO TABS
100.0000 mg | ORAL_TABLET | ORAL | Status: DC
  Administered 2020-04-27 – 2020-05-01 (×3): 100 mg via ORAL
  Filled 2020-04-26 (×4): qty 1

## 2020-04-26 MED ORDER — VANCOMYCIN HCL 1500 MG/300ML IV SOLN
1500.0000 mg | Freq: Once | INTRAVENOUS | Status: DC
  Administered 2020-04-26: 1500 mg via INTRAVENOUS
  Filled 2020-04-26: qty 300

## 2020-04-26 MED ORDER — EZETIMIBE 10 MG PO TABS
10.0000 mg | ORAL_TABLET | Freq: Every day | ORAL | Status: DC
  Administered 2020-04-26 – 2020-04-30 (×5): 10 mg via ORAL
  Filled 2020-04-26 (×5): qty 1

## 2020-04-26 MED ORDER — TORSEMIDE 20 MG PO TABS
20.0000 mg | ORAL_TABLET | Freq: Every day | ORAL | Status: DC
  Administered 2020-04-27 – 2020-05-01 (×5): 20 mg via ORAL
  Filled 2020-04-26 (×5): qty 1

## 2020-04-26 MED ORDER — VANCOMYCIN HCL IN DEXTROSE 1-5 GM/200ML-% IV SOLN
1000.0000 mg | Freq: Two times a day (BID) | INTRAVENOUS | Status: DC
  Filled 2020-04-26: qty 200

## 2020-04-26 MED ORDER — ESCITALOPRAM OXALATE 20 MG PO TABS
20.0000 mg | ORAL_TABLET | Freq: Every day | ORAL | Status: DC
  Administered 2020-04-27 – 2020-05-01 (×5): 20 mg via ORAL
  Filled 2020-04-26 (×5): qty 1

## 2020-04-26 MED ORDER — METOPROLOL SUCCINATE ER 25 MG PO TB24
50.0000 mg | ORAL_TABLET | Freq: Every day | ORAL | Status: DC
  Administered 2020-04-27 – 2020-05-01 (×5): 50 mg via ORAL
  Filled 2020-04-26 (×5): qty 2

## 2020-04-26 MED ORDER — ENOXAPARIN SODIUM 40 MG/0.4ML ~~LOC~~ SOLN
40.0000 mg | SUBCUTANEOUS | Status: DC
  Administered 2020-04-28 – 2020-05-01 (×4): 40 mg via SUBCUTANEOUS
  Filled 2020-04-26 (×5): qty 0.4

## 2020-04-26 MED ORDER — OXYCODONE HCL 5 MG PO TABS: 5.0000 mg | ORAL_TABLET | Freq: Four times a day (QID) | ORAL | Status: DC | PRN

## 2020-04-26 MED ORDER — DARUNAVIR-COBICISTAT 800-150 MG PO TABS
1.0000 | ORAL_TABLET | Freq: Every day | ORAL | Status: DC
  Administered 2020-04-28 – 2020-05-01 (×4): 1 via ORAL
  Filled 2020-04-26 (×6): qty 1

## 2020-04-26 MED ORDER — POTASSIUM CHLORIDE CRYS ER 20 MEQ PO TBCR
20.0000 meq | EXTENDED_RELEASE_TABLET | Freq: Two times a day (BID) | ORAL | Status: DC
  Administered 2020-04-26: 20 meq via ORAL
  Filled 2020-04-26: qty 1

## 2020-04-26 MED ORDER — L-METHYLFOLATE-B6-B12 3-35-2 MG PO TABS
1.0000 | ORAL_TABLET | Freq: Two times a day (BID) | ORAL | Status: DC
  Administered 2020-04-26 – 2020-05-01 (×10): 1 via ORAL
  Filled 2020-04-26 (×11): qty 1

## 2020-04-26 MED ORDER — PIRFENIDONE 801 MG PO TABS
1.0000 | ORAL_TABLET | Freq: Every day | ORAL | Status: DC
  Administered 2020-04-28: 1 via ORAL
  Filled 2020-04-26: qty 1

## 2020-04-26 MED ORDER — FOLIC ACID 1 MG PO TABS
1.0000 mg | ORAL_TABLET | Freq: Every day | ORAL | Status: DC
  Administered 2020-04-27 – 2020-05-01 (×5): 1 mg via ORAL
  Filled 2020-04-26 (×5): qty 1

## 2020-04-26 MED ORDER — VALACYCLOVIR HCL 500 MG PO TABS
500.0000 mg | ORAL_TABLET | Freq: Every day | ORAL | Status: DC
  Administered 2020-04-27 – 2020-05-01 (×5): 500 mg via ORAL
  Filled 2020-04-26 (×5): qty 1

## 2020-04-26 MED ORDER — BICTEGRAVIR-EMTRICITAB-TENOFOV 50-200-25 MG PO TABS
1.0000 | ORAL_TABLET | Freq: Every day | ORAL | Status: DC
  Administered 2020-04-27 – 2020-05-01 (×5): 1 via ORAL
  Filled 2020-04-26 (×5): qty 1

## 2020-04-26 MED ORDER — CICLOPIROX OLAMINE 0.77 % EX CREA: TOPICAL_CREAM | Freq: Two times a day (BID) | CUTANEOUS | Status: DC

## 2020-04-26 NOTE — H&P (Signed)
History and Physical    [name] KXF:[id number] DOB: Sep 10, 1950 DOA: 04/26/2020  PCP: [name], MD (Confirm with patient/family/NH records and if not entered, this has to be entered at [facility] point of entry) Patient coming from: Home  I have personally briefly reviewed patient's old medical records in [place]  Chief Complaint: Left hip pain  HPI: [name] is a 69 y.o. male with medical history significant of left hip replacement x5, recurrent left hip osteomyelitis with most recent episode in June 2021, HIV on HAART, CLL, ILD with chronic hypoxic respite failure on 2 L O2 via nasal cannula, rheumatoid arthritis, history of DVT on prophylaxis dose of edoxaban, presented with recurrent left hip pain and low-grade fever.  Patient has been off any antibiotics since September 2021.  Last time he underwent left hip revision in June, was later found to be infected for which he underwent IV and p.o. antibiotics for total of 75-month.  This time, symptoms started 8 to 10 days ago, patient started to have left hip pain, usually associated with bending down to reach something, sometimes sitting straight.  About 7 days ago, he thought he caught a stomach flu with frequent feeling of nausea and sporadic vomiting and watery diarrhea for 3 days which has resolved 3 days ago.  He checked his temperature was around 99, compared to his baseline temperature around lower 97, and the meantime he found it the left hip pain has been getting worse.  He contacted orthopedic surgeon who recommend patient come in and started the patient on vancomycin.   Review of Systems: As per HPI otherwise 14 point review of systems negative.    Past Medical History:  Diagnosis Date  . Allergy   . Anxiety   . Carotid artery occlusion    40-60% right ICA stenosis (09/2008)  . Cataract   . Chronic back pain   . CLL (chronic lymphoblastic leukemia) dx 2010   Followed at [facility] q60mo, no current therapy   .  Clotting disorder ([place])   . Coronary artery disease 2010   s/p CABG '10, sees Dr. Percival Spanish  . Depression   . Diverticulosis   . DVT, lower extremity, recurrent ([place]) 2008, 2009   LLE, chronic anticoag since 2009  . Esophagitis   . Fibromyalgia   . Gallstones   . GERD (gastroesophageal reflux disease)   . Gout   . Gynecomastia, male   . H/O hiatal hernia 2008   surgery  . Hemorrhoids   . Hepatitis A yrs ago  . HIV infection ([place]) dx 1993  . Hypertension   . Impotence of organic origin   . Myocardial infarction ([place]) 2010    x 2  . Neuromuscular disorder (H[facility])    neuropathy  . On home oxygen therapy    2L [place] at bedtime  . Osteoarthritis, knee    s/p B TKA  . Osteoporosis   . PONV (postoperative nausea and vomiting)   . Pulmonary fibrosis ([place]) 2017  . Rheumatoid arthritis(714.0) dx 2010   MTX, follows with rheum  . Seasonal allergies   . Secondary syphilis 07/24/14 dx   s/p 2 wks doxy  . Status post dilation of esophageal narrowing   . Stroke [facility]) 1969   TIA  . TIA (transient ischemic attack) 1997   mild residual L mouth droop  . Tubular adenoma of colon     Past Surgical History:  Procedure Laterality Date  . ACETABULAR REVISION Left 11/03/2019  REVISION LEFT HIP ACETABULAR COMPONENT (Left Hip)  . ANTERIOR HIP REVISION Left 08/17/2018   Procedure: ANTERIOR LEFT HIP REVISION;  Surgeon: Mcarthur Rossetti, MD;  Location: [place];  Service: Orthopedics;  Laterality: Left;  . ANTERIOR HIP REVISION Left 11/02/2018   Procedure: LEFT HIP CONSTRAINED LINER REVISION;  Surgeon: Mcarthur Rossetti, MD;  Location: [place];  Service: Orthopedics;  Laterality: Left;  . ANTERIOR HIP REVISION Left 11/03/2019   Procedure: REVISION LEFT HIP ACETABULAR COMPONENT;  Surgeon: Mcarthur Rossetti, MD;  Location: [place];  Service: Orthopedics;  Laterality: Left;  . BACK SURGERY  2010 & 2017  . BRONCHIAL WASHINGS  02/27/2020   Procedure: BRONCHIAL WASHINGS;  Surgeon: Brand Males, MD;  [facility];  Service: Cardiopulmonary;;  . CHOLECYSTECTOMY    . COLONOSCOPY WITH PROPOFOL N/A 12/28/2012   Procedure: COLONOSCOPY WITH PROPOFOL;  Surgeon: Jerene Bears, MD;  [facility];  Service: Gastroenterology;  Laterality: N/A;  . CORONARY ARTERY BYPASS GRAFT  2010   triple bypass  . ESOPHAGOGASTRODUODENOSCOPY (EGD) WITH PROPOFOL N/A 12/28/2012   Procedure: ESOPHAGOGASTRODUODENOSCOPY (EGD) WITH PROPOFOL;  Surgeon: Jerene Bears, MD;  [facility];  Service: Gastroenterology;  Laterality: N/A;  . ESOPHAGOGASTRODUODENOSCOPY (EGD) WITH PROPOFOL N/A 03/15/2013   Procedure: ESOPHAGOGASTRODUODENOSCOPY (EGD) WITH PROPOFOL;  Surgeon: Jerene Bears, MD;  [facility];  Service: Gastroenterology;  Laterality: N/A;  . ESOPHAGOGASTRODUODENOSCOPY (EGD) WITH PROPOFOL N/A 02/07/2016   Procedure: ESOPHAGOGASTRODUODENOSCOPY (EGD) WITH PROPOFOL;  Surgeon: Jerene Bears, MD;  [facility];  Service: Gastroenterology;  Laterality: N/A;  . HARDWARE REMOVAL N/A 07/02/2012   Procedure: HARDWARE REMOVAL;  Surgeon: Elaina Hoops, MD;  Location: [place] [facility];  Service: Neurosurgery;  Laterality: N/A;  . HIATAL HERNIA REPAIR     wrap  . HIP CLOSED REDUCTION Left 08/15/2018   Procedure: CLOSED REDUCTION HIP;  Surgeon: Newt Minion, MD;  Location: [place];  Service: Orthopedics;  Laterality: Left;  . HIP CLOSED REDUCTION Left 08/28/2018   Procedure: CLOSED REDUCTION HIP FOR RECURRENT DISLOCATION AND DRESSING CHANGE;  Surgeon: Jessy Oto, MD;  Location: [place];  Service: Orthopedics;  Laterality: Left;  . HIP CLOSED REDUCTION Left 09/15/2018   Procedure: CLOSED REDUCTION HIP DISLOCATION;  Surgeon: Mcarthur Rossetti, MD;  Location: [place];  Service: Orthopedics;  Laterality: Left;  . HIP CLOSED REDUCTION Left 10/31/2018   Procedure: CLOSED REDUCTION LEFT TOTAL HIP;  Surgeon: Leandrew Koyanagi, MD;  Location: [place];  Service: Orthopedics;  Laterality: Left;  .  INGUINAL HERNIA REPAIR Bilateral   . JOINT REPLACEMENT Left 1999  . KNEE ARTHROPLASTY  07/22/2011   Procedure: COMPUTER ASSISTED TOTAL KNEE ARTHROPLASTY;  Surgeon: Meredith Pel, MD;  Location: [place];  Service: Orthopedics;  Laterality: Right;  Right total knee arthroplasty  . MANDIBLE SURGERY Bilateral    tmj  . REPLACEMENT TOTAL KNEE Bilateral   . ring around testicle hernia reapir  184 and 1986   x 2  . ROTATOR CUFF REPAIR Right   . SHOULDER SURGERY Left   . SPINE SURGERY  2010   "rod and screws", "failed", lopwer spine,   . stent to heart x 1  2010  . TONSILLECTOMY    . TOTAL HIP ARTHROPLASTY Left 08/13/2018   Procedure: LEFT TOTAL HIP ARTHROPLASTY ANTERIOR APPROACH;  Surgeon: Mcarthur Rossetti, MD;  Location: [place];  Service: Orthopedics;  Laterality: Left;  . TOTAL HIP ARTHROPLASTY Left 09/17/2018   Procedure: ANTERIOR LEFT HIP  REVISION;  Surgeon: Mcarthur Rossetti, MD;  Location: [place];  Service: Orthopedics;  Laterality: Left;  . UMBILICAL HERNIA REPAIR     x 1  . varicose vein     stripping  . VIDEO BRONCHOSCOPY Bilateral 10/16/2015   Procedure: VIDEO BRONCHOSCOPY WITHOUT FLUORO;  Surgeon: Brand Males, MD;  [facility];  Service: Cardiopulmonary;  Laterality: Bilateral;  . VIDEO BRONCHOSCOPY N/A 02/27/2020   Procedure: VIDEO BRONCHOSCOPY;  Surgeon: Brand Males, MD;  [facility];  Service: Cardiopulmonary;  Laterality: N/A;  bronchial lavage     reports that he has never smoked. He has never used smokeless tobacco. He reports previous alcohol use. He reports previous drug use. Drug: Marijuana.  Allergies  Allergen Reactions  . Golimumab Anaphylaxis    Simponi ARIA  . Orencia [Abatacept] Anaphylaxis  . Other Anaphylaxis and Hives    Pecans  . Peanut-Containing Drug Products Anaphylaxis, Hives and Swelling    Swelling of throat  . Morphine Other (See Comments)    Severe headache- Can tolerate Dilaudid, however  .  Oxycodone-Acetaminophen Other (See Comments)    Headache 6.22.2020 patient is currently taking and tolerating  . Promethazine Hcl Other (See Comments)    Makes him feel "drunk" at higher strengths    Family History  Problem Relation Age of Onset  . Breast cancer Mother   . Hypertension Mother   . Hyperlipidemia Mother   . Diabetes Mother   . Prostate cancer Father   . Colon polyps Father   . Hyperlipidemia Father   . Crohn's disease Paternal Aunt   . Diabetes Maternal Grandmother   . Diabetes Brother        x 3  . Heart attack Brother   . Heart disease Brother        x 3  . Heart attack Brother   . Hyperlipidemia Brother        x 3  . Alcohol abuse Daughter   . Drug abuse Daughter   . Asthma Brother   . CVA Brother   . Colon cancer Neg Hx   . Esophageal cancer Neg Hx   . Rectal cancer Neg Hx   . Stomach cancer Neg Hx      Prior to Admission medications   Medication Sig Start Date End Date Taking? Authorizing Provider  acetaminophen (TYLENOL) 500 MG tablet Take 500-1,000 mg by mouth every 6 (six) hours as needed for mild pain or moderate pain.     [provider]  atorvastatin (LIPITOR) 10 MG tablet Take 1 tablet (10 mg total) by mouth at bedtime. 11/15/19   Angiulli, Lavon Paganini, PA-C  bictegravir-emtricitabine-tenofovir AF (BIKTARVY) 50-200-25 MG TABS tablet Take 1 tablet by mouth daily.  08/18/19   [provider]  ciclopirox (LOPROX) 0.77 % cream Apply topically 2 (two) times daily. 04/11/20   [name], MD  clindamycin (CLEOCIN) 300 MG capsule Take 2 capsules (600 mg total) by mouth See admin instructions. Take two by mouth one hour before dental appointment, then two by mouth six hours after appointment 04/09/20   Mcarthur Rossetti, MD  darunavir-cobicistat (PREZCOBIX) 800-150 MG tablet Take 1 tablet by mouth daily with breakfast.  09/15/19   [provider]  EPINEPHrine (EPIPEN 2-PAK) 0.3 mg/0.3 mL IJ SOAJ injection Inject 0.3 mg into  the muscle as needed for anaphylaxis.    [provider]  escitalopram (LEXAPRO) 20 MG tablet TAKE 1 TABLET BY MOUTH EVERY DAY 04/02/20   [name],  MD  ezetimibe (ZETIA) 10 MG tablet Take 1 tablet (10 mg total) by mouth daily. Patient taking differently: Take 10 mg by mouth at bedtime.  10/14/19   Kroeger, Lorelee Cover., PA-C  fluocinonide cream (LIDEX) 8.18 % Apply 1 application topically 2 (two) times daily. 04/11/20   [name], MD  folic acid (FOLVITE) 1 MG tablet Take 1 mg by mouth daily. 09/17/17   [provider]  KLOR-CON M20 20 MEQ tablet TAKE 1 TABLET (20 MEQ TOTAL) BY MOUTH 3 (THREE) TIMES DAILY. Patient taking differently: Take 20 mEq by mouth 2 (two) times daily.  02/15/20   [name], MD  l-methylfolate-B6-B12 (METANX) 3-35-2 MG TABS tablet TAKE 1 TABLET BY MOUTH 2 (TWO) TIMES DAILY. 01/10/20   Dohmeier, Asencion Partridge, MD  leflunomide (ARAVA) 20 MG tablet Take 20 mg by mouth daily.  07/10/18   [provider]  metoprolol succinate (TOPROL-XL) 50 MG 24 hr tablet Take 1 tablet (50 mg total) by mouth daily. Take with or immediately following a meal. 11/16/19   Angiulli, Lavon Paganini, PA-C  nitroGLYCERIN (NITROSTAT) 0.4 MG SL tablet Place 1 tablet (0.4 mg total) under the tongue every 5 (five) minutes as needed for chest pain. 12/24/17 10/24/20  [name], MD  ondansetron (ZOFRAN) 4 MG tablet Take 4 mg by mouth every 6 (six) hours as needed for nausea.    [provider]  oxyCODONE (OXY IR/ROXICODONE) 5 MG immediate release tablet Take 1-2 tablets (5-10 mg total) by mouth every 6 (six) hours as needed for moderate pain (pain score 4-6). 01/04/20   Mcarthur Rossetti, MD  Pirfenidone (ESBRIET) 801 MG TABS Take 1 tablet by mouth in the morning, at noon, and at bedtime. 04/02/20   Brand Males, MD  prednisoLONE acetate (PRED FORTE) 1 % ophthalmic suspension Place 1 drop into both eyes in the morning and at bedtime.    [provider]   predniSONE (DELTASONE) 5 MG tablet Take 5 mg by mouth daily with breakfast.  11/02/16   [provider]  pregabalin (LYRICA) 200 MG capsule Take 1 capsule (200 mg total) by mouth 2 (two) times daily. 12/19/19   [name], MD  RESTASIS 0.05 % ophthalmic emulsion  03/27/20   [provider]  SAVAYSA 30 MG TABS tablet TAKE 1 TABLET (30 MG TOTAL) BY MOUTH DAILY. 11/26/19   [name], MD  sulfamethoxazole-trimethoprim (BACTRIM DS) 800-160 MG tablet Take 1 tablet by mouth daily. 03/21/20   [name], MD  sulfaSALAzine (AZULFIDINE) 500 MG tablet Take 500 mg by mouth in the morning and at bedtime.  09/17/19   [provider]  tamsulosin (FLOMAX) 0.4 MG CAPS capsule Take 1 capsule (0.4 mg total) by mouth daily after supper. 04/04/20   McKenzie, Candee Furbish, MD  thiamine 100 MG tablet Take 1 tablet (100 mg total) by mouth every other day. 04/11/20   [name], MD  torsemide (DEMADEX) 20 MG tablet TAKE 1 TABLET BY MOUTH EVERY DAY 04/13/20   [name], MD  valACYclovir (VALTREX) 500 MG tablet TAKE 1 TABLET BY MOUTH EVERY DAY 03/16/20   Campbell Riches, MD  SAVAYSA 30 MG TABS tablet TAKE 1 TABLET (30 MG TOTAL) BY MOUTH DAILY. Patient taking differently: Take 30 mg by mouth daily.  02/02/19   [name], MD  torsemide (DEMADEX) 20 MG tablet Take 20 mg by mouth daily.    [provider]    Physical Exam: Vitals:  04/26/20 1715  BP: 108/71  Pulse: (!) 104  Resp: 18  Temp: 98.7 F (37.1 C)  TempSrc: Oral  SpO2: 95%    Constitutional: NAD, calm, comfortable Vitals:   04/26/20 1715  BP: 108/71  Pulse: (!) 104  Resp: 18  Temp: 98.7 F (37.1 C)  TempSrc: Oral  SpO2: 95%   Eyes: PERRL, lids and conjunctivae normal ENMT: Mucous membranes are moist. Posterior pharynx clear of any exudate or lesions.Normal dentition.  Neck: normal, supple, no masses, no thyromegaly Respiratory: clear to auscultation bilaterally, no wheezing, no  crackles. Normal respiratory effort. No accessory muscle use.  Cardiovascular: Regular rate and rhythm, no murmurs / rubs / gallops. No extremity edema. 2+ pedal pulses. No carotid bruits.  Abdomen: no tenderness, no masses palpated. No hepatosplenomegaly. Bowel sounds positive.  Musculoskeletal: no clubbing / cyanosis. No joint deformity upper and lower extremities.  Decreased ROM of left hip with tenderness Skin: no rashes, lesions, ulcers. No induration Neurologic: CN 2-12 grossly intact. Sensation intact, DTR normal. Strength 5/5 in all 4.  Psychiatric: Normal judgment and insight. Alert and oriented x 3. Normal mood.     Labs on Admission: I have personally reviewed following labs and imaging studies  CBC: No results for input(s): WBC, NEUTROABS, HGB, HCT, MCV, PLT in the last 168 hours. Basic Metabolic Panel: No results for input(s): NA, K, CL, CO2, GLUCOSE, BUN, CREATININE, CALCIUM, MG, PHOS in the last 168 hours. GFR: Estimated Creatinine Clearance: 72 mL/min (by C-G formula based on SCr of 0.88 mg/dL). Liver Function Tests: No results for input(s): AST, ALT, ALKPHOS, BILITOT, PROT, ALBUMIN in the last 168 hours. No results for input(s): LIPASE, AMYLASE in the last 168 hours. No results for input(s): AMMONIA in the last 168 hours. Coagulation Profile: No results for input(s): INR, PROTIME in the last 168 hours. Cardiac Enzymes: No results for input(s): CKTOTAL, CKMB, CKMBINDEX, TROPONINI in the last 168 hours. BNP (last 3 results) No results for input(s): PROBNP in the last 8760 hours. HbA1C: No results for input(s): HGBA1C in the last 72 hours. CBG: No results for input(s): GLUCAP in the last 168 hours. Lipid Profile: No results for input(s): CHOL, HDL, LDLCALC, TRIG, CHOLHDL, LDLDIRECT in the last 72 hours. Thyroid Function Tests: No results for input(s): TSH, T4TOTAL, FREET4, T3FREE, THYROIDAB in the last 72 hours. Anemia Panel: No results for input(s): VITAMINB12,  FOLATE, FERRITIN, TIBC, IRON, RETICCTPCT in the last 72 hours. Urine analysis:    Component Value Date/Time   COLORURINE YELLOW 11/16/2018 1526   APPEARANCEUR Clear 12/08/2019 0937   LABSPEC >=1.030 (A) 11/16/2018 1526   PHURINE 6.0 11/16/2018 1526   GLUCOSEU Negative 12/08/2019 0937   GLUCOSEU NEGATIVE 11/16/2018 1526   HGBUR NEGATIVE 11/16/2018 1526   BILIRUBINUR Negative 12/08/2019 [place] 11/16/2018 1526   PROTEINUR Negative 12/08/2019 [place] 09/26/2018 2011   UROBILINOGEN negative (A) 09/21/2019 1006   UROBILINOGEN 1.0 11/16/2018 1526   NITRITE Negative 12/08/2019 0937   NITRITE NEGATIVE 11/16/2018 1526   LEUKOCYTESUR Negative 12/08/2019 0937   LEUKOCYTESUR NEGATIVE 11/16/2018 1526    Radiological Exams on Admission: DG HIP PORT UNILAT WITH PELVIS 1V LEFT  Result Date: 04/26/2020 CLINICAL DATA:  Infection associated with internal left hip prosthesis, subsequent encounter. EXAM: DG HIP (WITH OR WITHOUT PELVIS) 1V PORT LEFT COMPARISON:  Left hip radiograph 04/09/2020 FINDINGS: Left hip arthroplasty in place. There is no convincing periprosthetic lucency. Heterotopic calcification adjacent to the greater trochanter. Pubic rami are intact.  Postsurgical change in the included lumbar spine. The bones are under mineralized. IMPRESSION: Left hip arthroplasty in place. No convincing periprosthetic lucency. Electronically Signed   By: Keith Rake M.D.   On: 04/26/2020 19:17    EKG: Ordered  Assessment/Plan Active Problems:   Osteomyelitis ([place])  (please populate well all problems here in Problem List. (For example, if patient is on BP meds at home and you resume or decide to hold them, it is a problem that needs to be her. Same for CAD, COPD, HLD and so on)  Left hip pain suspect osteomyelitis -He does have a leukocytosis of 16.6 compared to baseline around 14, however with rather nonsignificant differential with lymphocyte dominant as  compared to his baseline. -Given his past medical history of recurrent left hip hardware infection, agreed with empirically antibiotics until osteomyelitis walled by Intra-Op operation culture. -Orthopedic surgery on board, saw the patient earlier and plan for left hip washout tomorrow.  Diarrhea -Resolved, not correlated to antibiotic use, discontinue C. difficile work-up.  HIV on HAART -Continue HAART  History of DVT -Switch to Lovenox for now for DVT prophylaxis  History of RA -Continue prednisone, continue sulfasalazine  Chronic hypoxic respite failure -O2 demand at baseline  ILD -Stable  HTN -Continue home BP regimen  DVT prophylaxis: Lovenox Code Status: Full code Family Communication: None at bedside Disposition Plan: Expect more than 2-day hospital stay for left hip washout to rule out osteomyelitis. Consults called: Orthopedic surgery Admission status: MedSurg   Lequita Halt MD [facility] Pager [phone]  04/26/2020, 7:44 PM

## 2020-04-26 NOTE — Progress Notes (Signed)
Patient ID: DET[place] FRIEDEN, male   DOB: [birth year], 69 y.o.   MRN: [id number] [name] is very well-known to me.  He has had multiple surgeries on his left hip.  He is also had a previous infection but this left hip.  He has a revision arthroplasty for multiple surgeries on the acetabular component.  On Sunday he presented to an outside hospital with nausea and feeling sick and diarrhea.  They obtained a CT scan of his left hip suggesting air in the soft tissues and fluid collection consistent with infection.  He has been on antibiotics for this before and is currently on antibiotics.  He was finally transferred to [facility] this evening.  He is not septic appearing at this standpoint and feels better overall.  I examined his left hip and his incision is healed nicely and there is no swelling or redness.  I put his hip the range of motion and it is the same pain that he has felt before.  However, given the CT scan findings I feel obligated that we need to proceed for an operative intervention tomorrow for his left hip.  This would be irrigating out the soft tissues and likely placing some type of deep antibiotic beads.  He is in a situation of likely needing chronic suppression for infection because we are going to try not to remove any type of hardware.  The other treatment for this as I told him would be excising all the hardware and leaving it out.  This would leave him quite debilitated.  We will order a diet for him now and have him n.p.o. after midnight tonight in anticipation of surgery light tomorrow.  I explained this to him in detail and he agrees.

## 2020-04-26 NOTE — Progress Notes (Signed)
Pharmacy Antibiotic Note  [name] is a 69 y.o. male admitted on 04/26/2020 with osteomyelitis .  Pharmacy has been consulted for Vancomycin dosing.  I+D planned for 12/17  Plan: Vancomycin 1500 mg iv x 1 dose now Vancomycin 1000 mg iv q 12 hours starting 12/17 Follow Scr, plan, cultures     Temp (24hrs), Avg:98.7 F (37.1 C), Min:98.7 F (37.1 C), Max:98.7 F (37.1 C)  No results for input(s): WBC, CREATININE, LATICACIDVEN, VANCOTROUGH, VANCOPEAK, VANCORANDOM, GENTTROUGH, GENTPEAK, GENTRANDOM, TOBRATROUGH, TOBRAPEAK, TOBRARND, AMIKACINPEAK, AMIKACINTROU, AMIKACIN in the last 168 hours.  Estimated Creatinine Clearance: 72 mL/min (by C-G formula based on SCr of 0.88 mg/dL).    Allergies  Allergen Reactions  . Golimumab Anaphylaxis    Simponi ARIA  . Orencia [Abatacept] Anaphylaxis  . Other Anaphylaxis and Hives    Pecans  . Peanut-Containing Drug Products Anaphylaxis, Hives and Swelling    Swelling of throat  . Morphine Other (See Comments)    Severe headache- Can tolerate Dilaudid, however  . Oxycodone-Acetaminophen Other (See Comments)    Headache 6.22.2020 patient is currently taking and tolerating  . Promethazine Hcl Other (See Comments)    Makes him feel "drunk" at higher strengths    Thank you for allowing pharmacy to be a part of this patient's care. Anette Guarneri, PharmD 04/26/2020 6:13 PM

## 2020-04-27 ENCOUNTER — Inpatient Hospital Stay (HOSPITAL_COMMUNITY): Payer: Medicare Other | Admitting: Certified Registered"

## 2020-04-27 ENCOUNTER — Encounter (HOSPITAL_COMMUNITY)
Admission: AD | Disposition: A | Discharge status: Home Health | Payer: Self-pay | Source: Other Acute Inpatient Hospital | Attending: Internal Medicine

## 2020-04-27 ENCOUNTER — Encounter (HOSPITAL_COMMUNITY): Payer: Self-pay | Admitting: Internal Medicine

## 2020-04-27 DIAGNOSIS — B974 Respiratory syncytial virus as the cause of diseases classified elsewhere: Secondary | ICD-10-CM

## 2020-04-27 DIAGNOSIS — L8990 Pressure ulcer of unspecified site, unspecified stage: Secondary | ICD-10-CM | POA: Insufficient documentation

## 2020-04-27 DIAGNOSIS — B20 Human immunodeficiency virus [HIV] disease: Secondary | ICD-10-CM

## 2020-04-27 DIAGNOSIS — T8452XD Infection and inflammatory reaction due to internal left hip prosthesis, subsequent encounter: Secondary | ICD-10-CM

## 2020-04-27 DIAGNOSIS — T8459XA Infection and inflammatory reaction due to other internal joint prosthesis, initial encounter: Secondary | ICD-10-CM

## 2020-04-27 DIAGNOSIS — T8459XD Infection and inflammatory reaction due to other internal joint prosthesis, subsequent encounter: Secondary | ICD-10-CM

## 2020-04-27 DIAGNOSIS — Y792 Prosthetic and other implants, materials and accessory orthopedic devices associated with adverse incidents: Secondary | ICD-10-CM

## 2020-04-27 DIAGNOSIS — Z96649 Presence of unspecified artificial hip joint: Secondary | ICD-10-CM

## 2020-04-27 HISTORY — PX: INCISION AND DRAINAGE HIP: SHX1801

## 2020-04-27 LAB — BASIC METABOLIC PANEL
Anion gap: 7 (ref 5–15)
BUN: 11 mg/dL (ref 8–23)
CO2: 31 mmol/L (ref 22–32)
Calcium: 7.2 mg/dL — ABNORMAL LOW (ref 8.9–10.3)
Chloride: 98 mmol/L (ref 98–111)
Creatinine, Ser: 0.77 mg/dL (ref 0.61–1.24)
GFR, Estimated: >60 mL/min (ref >60)
Glucose, Bld: 101 mg/dL — ABNORMAL HIGH (ref 70–99)
Potassium: 2.6 mmol/L — CL (ref 3.5–5.1)
Sodium: 136 mmol/L (ref 135–145)

## 2020-04-27 LAB — CBC
HCT: 33.1 % — ABNORMAL LOW (ref 39.0–52.0)
Hemoglobin: 10.2 g/dL — ABNORMAL LOW (ref 13.0–17.0)
MCH: 33.3 pg (ref 26.0–34.0)
MCHC: 30.8 g/dL (ref 30.0–36.0)
MCV: 108.2 fL — ABNORMAL HIGH (ref 80.0–100.0)
Platelets: 103 10*3/uL — ABNORMAL LOW (ref 150–400)
RBC: 3.06 MIL/uL — ABNORMAL LOW (ref 4.22–5.81)
RDW: 14.9 % (ref 11.5–15.5)
WBC: 12.5 10*3/uL — ABNORMAL HIGH (ref 4.0–10.5)
nRBC: 0 % (ref 0.0–0.2)

## 2020-04-27 SURGERY — IRRIGATION AND DEBRIDEMENT HIP
Anesthesia: General | Site: Hip | Laterality: Left

## 2020-04-27 MED ORDER — POTASSIUM CHLORIDE CRYS ER 20 MEQ PO TBCR
40.0000 meq | EXTENDED_RELEASE_TABLET | Freq: Three times a day (TID) | ORAL | Status: AC
  Administered 2020-04-27 (×2): 40 meq via ORAL
  Filled 2020-04-27 (×2): qty 2

## 2020-04-27 MED ORDER — ONDANSETRON HCL 4 MG/2ML IJ SOLN
INTRAMUSCULAR | Status: AC
  Filled 2020-04-27: qty 2

## 2020-04-27 MED ORDER — ROCURONIUM BROMIDE 10 MG/ML (PF) SYRINGE
PREFILLED_SYRINGE | INTRAVENOUS | Status: AC
  Filled 2020-04-27: qty 10

## 2020-04-27 MED ORDER — PHENYLEPHRINE 40 MCG/ML (10ML) SYRINGE FOR IV PUSH (FOR BLOOD PRESSURE SUPPORT)
PREFILLED_SYRINGE | INTRAVENOUS | Status: DC | PRN
  Administered 2020-04-27: 360 ug via INTRAVENOUS
  Administered 2020-04-27 (×2): 120 ug via INTRAVENOUS

## 2020-04-27 MED ORDER — ONDANSETRON HCL 4 MG/2ML IJ SOLN
INTRAMUSCULAR | Status: DC | PRN
  Administered 2020-04-27: 4 mg via INTRAVENOUS

## 2020-04-27 MED ORDER — EPHEDRINE SULFATE-NACL 50-0.9 MG/10ML-% IV SOSY
PREFILLED_SYRINGE | INTRAVENOUS | Status: DC | PRN
  Administered 2020-04-27: 5 mg via INTRAVENOUS

## 2020-04-27 MED ORDER — ROCURONIUM BROMIDE 10 MG/ML (PF) SYRINGE
PREFILLED_SYRINGE | INTRAVENOUS | Status: DC | PRN
  Administered 2020-04-27: 50 mg via INTRAVENOUS

## 2020-04-27 MED ORDER — LIDOCAINE 2% (20 MG/ML) 5 ML SYRINGE
INTRAMUSCULAR | Status: DC | PRN
  Administered 2020-04-27: 100 mg via INTRAVENOUS

## 2020-04-27 MED ORDER — LIDOCAINE 2% (20 MG/ML) 5 ML SYRINGE
INTRAMUSCULAR | Status: AC
  Filled 2020-04-27: qty 5

## 2020-04-27 MED ORDER — PHENYLEPHRINE HCL-NACL 10-0.9 MG/250ML-% IV SOLN
INTRAVENOUS | Status: DC | PRN
  Administered 2020-04-27: 50 ug/min via INTRAVENOUS

## 2020-04-27 MED ORDER — VANCOMYCIN HCL IN DEXTROSE 1-5 GM/200ML-% IV SOLN
INTRAVENOUS | Status: AC
  Filled 2020-04-27: qty 200

## 2020-04-27 MED ORDER — VANCOMYCIN HCL 1000 MG IV SOLR
INTRAVENOUS | Status: DC | PRN
  Administered 2020-04-27: 1000 mg via TOPICAL

## 2020-04-27 MED ORDER — LACTATED RINGERS IV SOLN: INTRAVENOUS | Status: DC | PRN

## 2020-04-27 MED ORDER — VASOPRESSIN 20 UNIT/ML IV SOLN
INTRAVENOUS | Status: AC
  Filled 2020-04-27: qty 1

## 2020-04-27 MED ORDER — SUGAMMADEX SODIUM 200 MG/2ML IV SOLN
INTRAVENOUS | Status: DC | PRN
  Administered 2020-04-27: 300 mg via INTRAVENOUS
  Administered 2020-04-27: 100 mg via INTRAVENOUS

## 2020-04-27 MED ORDER — CEFAZOLIN SODIUM-DEXTROSE 2-4 GM/100ML-% IV SOLN
INTRAVENOUS | Status: AC
  Filled 2020-04-27: qty 100

## 2020-04-27 MED ORDER — VASOPRESSIN 20 UNIT/ML IV SOLN
INTRAVENOUS | Status: DC | PRN
  Administered 2020-04-27: 1 [IU] via INTRAVENOUS

## 2020-04-27 MED ORDER — DEXAMETHASONE SODIUM PHOSPHATE 10 MG/ML IJ SOLN
INTRAMUSCULAR | Status: DC | PRN
  Administered 2020-04-27: 5 mg via INTRAVENOUS

## 2020-04-27 MED ORDER — PROPOFOL 10 MG/ML IV BOLUS
INTRAVENOUS | Status: DC | PRN
  Administered 2020-04-27: 140 mg via INTRAVENOUS

## 2020-04-27 MED ORDER — POTASSIUM CHLORIDE 10 MEQ/100ML IV SOLN
10.0000 meq | INTRAVENOUS | Status: AC
Start: 2020-04-27 — End: 2020-04-27
  Administered 2020-04-27 (×3): 10 meq via INTRAVENOUS
  Filled 2020-04-27 (×3): qty 100

## 2020-04-27 MED ORDER — OXYCODONE HCL 5 MG PO TABS
5.0000 mg | ORAL_TABLET | Freq: Four times a day (QID) | ORAL | Status: DC | PRN
  Administered 2020-04-27 – 2020-04-28 (×2): 10 mg via ORAL
  Filled 2020-04-27 (×2): qty 2

## 2020-04-27 MED ORDER — SUCCINYLCHOLINE CHLORIDE 200 MG/10ML IV SOSY
PREFILLED_SYRINGE | INTRAVENOUS | Status: DC | PRN
  Administered 2020-04-27: 100 mg via INTRAVENOUS

## 2020-04-27 MED ORDER — DEXAMETHASONE SODIUM PHOSPHATE 10 MG/ML IJ SOLN
INTRAMUSCULAR | Status: AC
  Filled 2020-04-27: qty 1

## 2020-04-27 MED ORDER — VANCOMYCIN HCL 1000 MG IV SOLR
INTRAVENOUS | Status: AC
  Filled 2020-04-27: qty 1000

## 2020-04-27 MED ORDER — FENTANYL CITRATE (PF) 250 MCG/5ML IJ SOLN
INTRAMUSCULAR | Status: AC
  Filled 2020-04-27: qty 5

## 2020-04-27 MED ORDER — SODIUM CHLORIDE 0.9 % IR SOLN
Status: DC | PRN
  Administered 2020-04-27: 1000 mL
  Administered 2020-04-27: 3000 mL

## 2020-04-27 MED ORDER — FENTANYL CITRATE (PF) 250 MCG/5ML IJ SOLN
INTRAMUSCULAR | Status: DC | PRN
  Administered 2020-04-27: 150 ug via INTRAVENOUS

## 2020-04-27 MED ORDER — PHENYLEPHRINE 40 MCG/ML (10ML) SYRINGE FOR IV PUSH (FOR BLOOD PRESSURE SUPPORT)
PREFILLED_SYRINGE | INTRAVENOUS | Status: AC
  Filled 2020-04-27: qty 20

## 2020-04-27 MED FILL — Ondansetron HCl Inj 4 MG/2ML (2 MG/ML): INTRAMUSCULAR | Qty: 2 | Status: AC

## 2020-04-27 SURGICAL SUPPLY — 41 items
COVER SURGICAL LIGHT HANDLE (MISCELLANEOUS) ×2 IMPLANT
DRAPE IMP U-DRAPE 54X76 (DRAPES) ×2 IMPLANT
DRAPE ORTHO SPLIT 77X108 STRL (DRAPES) ×2
DRAPE SURG ORHT 6 SPLT 77X108 (DRAPES) ×2 IMPLANT
DRAPE U-SHAPE 47X51 STRL (DRAPES) ×4 IMPLANT
DRSG AQUACEL AG ADV 3.5X 6 (GAUZE/BANDAGES/DRESSINGS) ×2 IMPLANT
DURAPREP 26ML APPLICATOR (WOUND CARE) ×2 IMPLANT
ELECT REM PT RETURN 9FT ADLT (ELECTROSURGICAL) ×2
ELECTRODE REM PT RTRN 9FT ADLT (ELECTROSURGICAL) ×1 IMPLANT
GAUZE SPONGE 4X4 12PLY STRL (GAUZE/BANDAGES/DRESSINGS) ×2 IMPLANT
GLOVE BIO SURGEON STRL SZ8 (GLOVE) ×2 IMPLANT
GLOVE BIOGEL PI IND STRL 8 (GLOVE) ×1 IMPLANT
GLOVE BIOGEL PI INDICATOR 8 (GLOVE) ×1
GLOVE ORTHO TXT STRL SZ7.5 (GLOVE) ×4 IMPLANT
GOWN STRL REUS W/ TWL LRG LVL3 (GOWN DISPOSABLE) ×2 IMPLANT
GOWN STRL REUS W/ TWL XL LVL3 (GOWN DISPOSABLE) ×4 IMPLANT
GOWN STRL REUS W/TWL LRG LVL3 (GOWN DISPOSABLE) ×2
GOWN STRL REUS W/TWL XL LVL3 (GOWN DISPOSABLE) ×4
HANDPIECE INTERPULSE COAX TIP (DISPOSABLE) ×1
KIT BASIN OR (CUSTOM PROCEDURE TRAY) ×2 IMPLANT
KIT TURNOVER KIT B (KITS) ×2 IMPLANT
MANIFOLD NEPTUNE II (INSTRUMENTS) ×2 IMPLANT
NS IRRIG 1000ML POUR BTL (IV SOLUTION) ×2 IMPLANT
PACK TOTAL JOINT (CUSTOM PROCEDURE TRAY) ×2 IMPLANT
PACK UNIVERSAL I (CUSTOM PROCEDURE TRAY) ×2 IMPLANT
PAD ARMBOARD 7.5X6 YLW CONV (MISCELLANEOUS) ×4 IMPLANT
SET HNDPC FAN SPRY TIP SCT (DISPOSABLE) ×1 IMPLANT
SPONGE LAP 18X18 RF (DISPOSABLE) ×2 IMPLANT
SUT ETHILON 2 0 FS 18 (SUTURE) ×2 IMPLANT
SUT VIC AB 0 CT1 27 (SUTURE) ×1
SUT VIC AB 0 CT1 27XBRD ANBCTR (SUTURE) ×1 IMPLANT
SUT VIC AB 1 CT1 27 (SUTURE) ×1
SUT VIC AB 1 CT1 27XBRD ANTBC (SUTURE) ×1 IMPLANT
SUT VIC AB 1 CTB1 27 (SUTURE) IMPLANT
SUT VIC AB 2-0 CT1 27 (SUTURE) ×2
SUT VIC AB 2-0 CT1 TAPERPNT 27 (SUTURE) ×2 IMPLANT
SWAB CULTURE ESWAB REG 1ML (MISCELLANEOUS) ×4 IMPLANT
TOWEL GREEN STERILE (TOWEL DISPOSABLE) ×2 IMPLANT
TOWEL GREEN STERILE FF (TOWEL DISPOSABLE) ×2 IMPLANT
UNDERPAD 30X36 HEAVY ABSORB (UNDERPADS AND DIAPERS) ×2 IMPLANT
YANKAUER SUCT BULB TIP NO VENT (SUCTIONS) ×2 IMPLANT

## 2020-04-27 NOTE — Anesthesia Procedure Notes (Signed)
Procedure Name: Intubation Date/Time: 04/27/2020 5:43 PM Performed by: Dorthea Cove, CRNA Pre-anesthesia Checklist: Patient identified, Emergency Drugs available, Suction available and Patient being monitored Patient Re-evaluated:Patient Re-evaluated prior to induction Oxygen Delivery Method: Circle system utilized Preoxygenation: Pre-oxygenation with 100% oxygen Induction Type: IV induction Ventilation: Mask ventilation without difficulty Laryngoscope Size: Mac and 4 Grade View: Grade II Tube type: Oral Tube size: 7.5 mm Number of attempts: 1 Airway Equipment and Method: Stylet and Oral airway Placement Confirmation: ETT inserted through vocal cords under direct vision,  positive ETCO2 and breath sounds checked- equal and bilateral Secured at: 22 cm Tube secured with: Tape Dental Injury: Teeth and Oropharynx as per pre-operative assessment

## 2020-04-27 NOTE — Progress Notes (Signed)
Spoke to OR desk concerning patients covid test. Patient was at [place] and a respiratory panel was completed. The results are in care everywhere under [facility] lab results. His respiratory panel was negative for covid but positive for RSV, hence the droplet precautions. Anesthesia and OR desk was notified that negative test result is in chart. All information relayed to OR team as well. Patient was a direct admit from [facility] to [facility] through care link. Patient did not go home hence the negative covid test is valid for this admission.

## 2020-04-27 NOTE — Transfer of Care (Signed)
Immediate Anesthesia Transfer of Care Note  Patient: INDIO SANTILLI  Procedure(s) Performed: IRRIGATION AND DEBRIDEMENT HIP (Left Hip)  Patient Location: PACU  Anesthesia Type:General  Level of Consciousness: awake, alert  and oriented  Airway & Oxygen Therapy: Patient Spontanous Breathing and Patient connected to face mask oxygen  Post-op Assessment: Report given to RN and Post -op Vital signs reviewed and stable  Post vital signs: Reviewed and stable  Last Vitals:  Vitals Value Taken Time  BP 141/74 04/27/20 1852  Temp    Pulse 104 04/27/20 1855  Resp 15 04/27/20 1855  SpO2 97 % 04/27/20 1855  Vitals shown include unvalidated device data.  Last Pain:  Vitals:   04/27/20 1355  TempSrc: Oral  PainSc:       Patients Stated Pain Goal: 4 (81/10/31 5945)  Complications: No complications documented.

## 2020-04-27 NOTE — Consult Note (Signed)
[name] for Infectious Disease    Date of Admission:  04/26/2020     Reason for Consult: Prosthetic hip infection     Referring Physician: Dr. Tera Helper  ASSESSMENT:    # Prosthetic hip infection He has a history of prior Enterococcus faecalis prosthetic hip infection status post IV antibiotics earlier this year followed by oral suppressive amoxicillin that he has been off of since September/October.  Since that time he has developed increasing pain in his left hip concerning for smoldering infection in this area.  CT scan at outside hospital suggests air in the soft tissues and fluid collection consistent with infection.  Planning to go to the OR today with Dr. Ninfa Linden for operative intervention.  # RSV Tested positive at [facility] on 12/13.  # HIV Well-controlled on Biktarvy and Prezcobix   PLAN:    -- Hold antibiotics until after surgery and then start empiric ampicillin given prior Enterococcal infection -- Follow operative cultures -- Droplet precautions for RSV infection -- Continue ART   MEDICATIONS:    Scheduled Meds:  atorvastatin  10 mg Oral QHS   bictegravir-emtricitabine-tenofovir AF  1 tablet Oral Daily   ciclopirox   Topical BID   cycloSPORINE  1 drop Both Eyes BID   darunavir-cobicistat  1 tablet Oral Q breakfast   enoxaparin (LOVENOX) injection  40 mg Subcutaneous Q24H   escitalopram  20 mg Oral Daily   ezetimibe  10 mg Oral QHS   fluocinonide cream  1 application Topical BID   folic acid  1 mg Oral Daily   l-methylfolate-B6-B12  1 tablet Oral BID   leflunomide  20 mg Oral Daily   metoprolol succinate  50 mg Oral Daily   Pirfenidone  1 tablet Oral QHS   potassium chloride SA  40 mEq Oral TID   predniSONE  5 mg Oral Q breakfast   pregabalin  200 mg Oral BID   sulfaSALAzine  500 mg Oral BID   tamsulosin  0.4 mg Oral QPC supper   thiamine  100 mg Oral QODAY   torsemide  20 mg Oral Daily   valACYclovir  500 mg Oral  Daily    Continuous Infusions:   PRN Meds: acetaminophen, ondansetron, oxyCODONE  HPI:    [name] is a 69 y.o. male with a past medical history of avascular necrosis of the left hip status post prior hip arthroplasty and prosthetic hip infections, well-controlled HIV, interstitial lung disease, and rheumatoid arthritis who presents as a transfer from [facility] emergency department due to concern for recurrent left hip infection.  He underwent left total hip arthroplasty in April 2020.  Postoperatively he had instability and recurrent dislocations that required several other surgeries through June 2020.  In October 2020 he had a fall and sustained a periprosthetic fracture around the femoral stem.  CT scan in December showed lucency around some of the acetabular screws and he developed increasing pain in his left hip.  He underwent revision arthroplasty most recently June 2021 where acetabular screws were loose.  Tissue and fluid cultures grew Enterococcus.  He was treated with 6 weeks of IV antibiotics followed by oral amoxicillin suppression.  He reports currently being off oral suppression since approximately September/October.  Since that time he has developed increasing pain in his left hip.  He saw orthopedic surgery regarding this issue and continued to have pain which prompted him to present to [facility] on Monday.  Due to concern for sepsis he was  started on vancomycin and cefepime.  He incidentally also tested positive for RSV.  He reports since being on antibiotics his cough and sinus congestion has improved, however, he is still having pain in his hip.  A CT scan of the hip at the outside hospital suggested air in the soft tissues and a fluid collection consistent with infection.  As noted, he has been on vancomycin and cefepime while awaiting transfer.  Given his clinical stability this was stopped upon arrival here.  He is planning to go to the OR today with Dr. Ninfa Linden  for operative intervention today.   Past Medical History:  Diagnosis Date   Allergy    Anxiety    Carotid artery occlusion    40-60% right ICA stenosis (09/2008)   Cataract    Chronic back pain    CLL (chronic lymphoblastic leukemia) dx 2010   Followed at [facility] q22mo, no current therapy    Clotting disorder ([place])    Coronary artery disease 2010   s/p CABG '10, sees Dr. Percival Spanish   Depression    Diverticulosis    DVT, lower extremity, recurrent ([place]) 2008, 2009   LLE, chronic anticoag since 2009   Esophagitis    Fibromyalgia    Gallstones    GERD (gastroesophageal reflux disease)    Gout    Gynecomastia, male    H/O hiatal hernia 2008   surgery   Hemorrhoids    Hepatitis A yrs ago   HIV infection ([place]) dx 1993   Hypertension    Impotence of organic origin    Myocardial infarction ([place]) 2010    x 2   Neuromuscular disorder ([place])    neuropathy   On home oxygen therapy    2L [place] at bedtime   Osteoarthritis, knee    s/p B TKA   Osteoporosis    PONV (postoperative nausea and vomiting)    Pulmonary fibrosis ([place]) 2017   Rheumatoid arthritis(714.0) dx 2010   MTX, follows with rheum   Seasonal allergies    Secondary syphilis 07/24/14 dx   s/p 2 wks doxy   Status post dilation of esophageal narrowing    Stroke ([place]) 1969   TIA   TIA (transient ischemic attack) 1997   mild residual L mouth droop   Tubular adenoma of colon     Social History   Tobacco Use   Smoking status: Never Smoker   Smokeless tobacco: Never Used   Tobacco comment: occ wine  Vaping Use   Vaping Use: Never used  Substance Use Topics   Alcohol use: Not Currently    Comment: occasional wine- 1-2 per week   Drug use: Not Currently    Types: Marijuana    Comment: 1 month ago    Family History  Problem Relation Age of Onset   Breast cancer Mother    Hypertension Mother    Hyperlipidemia Mother    Diabetes Mother    Prostate cancer Father     Colon polyps Father    Hyperlipidemia Father    Crohn's disease Paternal Aunt    Diabetes Maternal Grandmother    Diabetes Brother        x 3   Heart attack Brother    Heart disease Brother        x 3   Heart attack Brother    Hyperlipidemia Brother        x 3   Alcohol abuse Daughter    Drug abuse Daughter    Asthma Brother  CVA Brother    Colon cancer Neg Hx    Esophageal cancer Neg Hx    Rectal cancer Neg Hx    Stomach cancer Neg Hx     Allergies  Allergen Reactions   Golimumab Anaphylaxis    Simponi ARIA   Orencia [Abatacept] Anaphylaxis   Other Anaphylaxis and Hives    Pecans   Peanut-Containing Drug Products Anaphylaxis, Hives and Swelling    Swelling of throat   Morphine Other (See Comments)    Severe headache- Can tolerate Dilaudid, however   Oxycodone-Acetaminophen Other (See Comments)    Headache 6.22.2020 patient is currently taking and tolerating   Promethazine Hcl Other (See Comments)    Makes him feel "drunk" at higher strengths    Review of Systems  Constitutional: Negative for chills and fever.  Respiratory: Positive for cough and sputum production. Negative for shortness of breath.   Cardiovascular: Negative.   Gastrointestinal: Negative.   Musculoskeletal: Positive for joint pain.  All other systems reviewed and are negative.   OBJECTIVE:   Blood pressure 126/66, pulse 90, temperature 99.5 F (37.5 C), temperature source Oral, resp. rate 18, weight 72.1 kg, SpO2 97 %. Body mass index is 24.9 kg/m.  Physical Exam Constitutional:      Appearance: Normal appearance.  HENT:     Head: Normocephalic and atraumatic.  Eyes:     General: No scleral icterus.    Extraocular Movements: Extraocular movements intact.     Pupils: Pupils are equal, round, and reactive to light.  Pulmonary:     Effort: Pulmonary effort is normal. No respiratory distress.     Comments: Currently on 4 liters  Neurological:     General: No  focal deficit present.     Mental Status: He is alert and oriented to person, place, and time.  Psychiatric:        Mood and Affect: Mood normal.        Behavior: Behavior normal.     Lab Results & Microbiology Lab Results  Component Value Date   WBC 12.5 (H) 04/27/2020   HGB 10.2 (L) 04/27/2020   HCT 33.1 (L) 04/27/2020   MCV 108.2 (H) 04/27/2020   PLT 103 (L) 04/27/2020    Lab Results  Component Value Date   NA 136 04/27/2020   K 2.6 (LL) 04/27/2020   CO2 31 04/27/2020   GLUCOSE 101 (H) 04/27/2020   BUN 11 04/27/2020   CREATININE 0.77 04/27/2020   CALCIUM 7.2 (L) 04/27/2020   GFRNONAA >60 04/27/2020   GFRAA 100 12/08/2019    Lab Results  Component Value Date   ALT 18 04/17/2020   AST 24 04/17/2020   ALKPHOS 87 04/17/2020   BILITOT 0.6 04/17/2020    C-Reactive Protein     Component Value Date/Time   CRP 6.7 (H) 09/26/2018 1748    Erythrocyte Sedimentation Rate  No results found for: ESRSEDRATE    I have reviewed the micro and lab results in Epic.  Imaging DG HIP PORT UNILAT WITH PELVIS 1V LEFT  Result Date: 04/26/2020 CLINICAL DATA:  Infection associated with internal left hip prosthesis, subsequent encounter. EXAM: DG HIP (WITH OR WITHOUT PELVIS) 1V PORT LEFT COMPARISON:  Left hip radiograph 04/09/2020 FINDINGS: Left hip arthroplasty in place. There is no convincing periprosthetic lucency. Heterotopic calcification adjacent to the greater trochanter. Pubic rami are intact. Postsurgical change in the included lumbar spine. The bones are under mineralized. IMPRESSION: Left hip arthroplasty in place. No convincing periprosthetic lucency. Electronically Signed  By: Keith Rake M.D.   On: 04/26/2020 19:17     [facility] Infectious [facility] [phone] pager 04/27/2020, 10:23 AM  I have spent a total of 80 minutes with the patient reviewing hospital notes,  test results, labs and examining the patient  as well as establishing an assessment and plan.

## 2020-04-27 NOTE — Anesthesia Postprocedure Evaluation (Signed)
Anesthesia Post Note  Patient: [name]  Procedure(s) Performed: IRRIGATION AND DEBRIDEMENT HIP (Left Hip)     Patient location during evaluation: PACU Anesthesia Type: General Level of consciousness: awake and alert Pain management: pain level controlled Vital Signs Assessment: post-procedure vital signs reviewed and stable Respiratory status: spontaneous breathing, nonlabored ventilation, respiratory function stable and patient connected to nasal cannula oxygen Cardiovascular status: blood pressure returned to baseline and stable Postop Assessment: no apparent nausea or vomiting Anesthetic complications: no   No complications documented.  Last Vitals:  Vitals:   04/27/20 1905 04/27/20 1925  BP: 126/69 100/70  Pulse: (!) 107 100  Resp: 17 18  Temp: 37 C 36.8 C  SpO2: 95% 90%    Last Pain:  Vitals:   04/27/20 1905  TempSrc:   PainSc: 0-No pain                 Effie Berkshire

## 2020-04-27 NOTE — Brief Op Note (Signed)
04/27/2020  6:42 PM  PATIENT:  [name]  69 y.o. male  PRE-OPERATIVE DIAGNOSIS:  chronic hip infection  POST-OPERATIVE DIAGNOSIS:  chronic hip infection  PROCEDURE:  Procedure(s): IRRIGATION AND DEBRIDEMENT HIP (Left)  SURGEON:  Surgeon(s) and Role:    Mcarthur Rossetti, MD - Primary  PHYSICIAN ASSISTANT:  Benita Stabile, PA-C  ANESTHESIA:   general  EBL:  10 mL   COUNTS:  YES  DICTATION: .Other Dictation: Dictation Number [id number]  PLAN OF CARE: Admit to inpatient   PATIENT DISPOSITION:  PACU - hemodynamically stable.   Delay start of Pharmacological VTE agent (>24hrs) due to surgical blood loss or risk of bleeding: no

## 2020-04-27 NOTE — Progress Notes (Signed)
PROGRESS NOTE    [name]  VQQ:[id number] DOB: 11-05-1950 DOA: 04/26/2020 PCP: [name], MD    Brief Narrative:  69 year old gentleman with history of avascular necrosis of the left hip status post prior hip arthroplasty and prosthetic hip infections, HIV on antiretroviral, interstitial lung disease on 2 L oxygen and rheumatoid arthritis brought to the hospital from [facility] emergency department due to concern about left hip infection.  Total hip arthroplasty in 08/2018, recurrent dislocations, periprosthetic fracture on 02/2019, periprosthetic infection and 10/2019 with Enterococcus faecalis.  Was treated with 6 weeks of IV antibiotics and oral amoxicillin suppression therapy.  Presented back to the hospital with ongoing progressive pain on the left hip.  A CT scan was consistent with periprosthetic fluid collection.   Assessment & Plan:   Active Problems:   Osteomyelitis (HCC)   Pressure injury of skin  Infection of the prosthetic hip: Currently hemodynamically stable.  Blood cultures to be drawn.  Holding antibiotics until surgery.  Seen by infectious disease.  Ampicillin after surgical cultures collected.  RSV: Tested in outside ER.  Droplet precautions.  HIV: Well controlled.  Last CD4 count 1200.  On Biktarvy and Prezcobix.  Hypokalemia: Severe hypokalemia.  Replaced aggressively with IV and oral potassium.  Recheck tomorrow morning and also check magnesium levels.  Chronic hypoxic respiratory failure due to interstitial lung disease: On 2 L of oxygen and stable.   DVT prophylaxis: enoxaparin (LOVENOX) injection 40 mg Start: 04/27/20 1000   Code Status: Full code Family Communication: None Disposition Plan: Status is: Inpatient  Remains inpatient appropriate because:Inpatient level of care appropriate due to severity of illness   Dispo: The patient is from: Home              Anticipated d/c is to: SNF              Anticipated d/c date is: 3 days               Patient currently is not medically stable to d/c.         Consultants:   Orthopedics  ID  Procedures:   None  Antimicrobials:  Antibiotics Given (last 72 hours)    Date/Time Action Medication Dose Rate   04/26/20 2055 New Bag/Given   vancomycin (VANCOREADY) IVPB 1500 mg/300 mL 1,500 mg 150 mL/hr   04/27/20 1127 Given   bictegravir-emtricitabine-tenofovir AF (BIKTARVY) 50-200-25 MG per tablet 1 tablet 1 tablet    04/27/20 1128 Given   valACYclovir (VALTREX) tablet 500 mg 500 mg          Subjective: Patient seen and examined.  No overnight events.  He was worried about the surgery.  Complains of pain on mobility even in the bed.  Objective: Vitals:   04/26/20 2016 04/27/20 0049 04/27/20 0511 04/27/20 0945  BP: 109/68 110/70 123/63 126/66  Pulse: 92 94 (!) 57 90  Resp: 18 18 18 18   Temp: 99 F (37.2 C) 98.8 F (37.1 C) 99.4 F (37.4 C) 99.5 F (37.5 C)  TempSrc: Oral Oral Oral Oral  SpO2: 95% 96% 96% 97%  Weight:        Intake/Output Summary (Last 24 hours) at 04/27/2020 1317 Last data filed at 04/27/2020 0655 Gross per 24 hour  Intake 859.23 ml  Output 400 ml  Net 459.23 ml   Filed Weights   04/26/20 2000  Weight: 72.1 kg    Examination:  General exam: Appears calm and comfortable  Looks comfortable on 2 L  oxygen. Respiratory system: Clear to auscultation. Respiratory effort normal.  No added sounds. Cardiovascular system: S1 & S2 heard, RRR. No JVD, murmurs, rubs, gallops or clicks. No pedal edema. Gastrointestinal system: Abdomen is nondistended, soft and nontender. No organomegaly or masses felt. Normal bowel sounds heard. Central nervous system: Alert and oriented. No focal neurological deficits. Extremities: Symmetric 5 x 5 power. Patient has mild tenderness on the left lateral aspect along the incision line.  No erythema or swelling. Skin: No rashes, lesions or ulcers Psychiatry: Judgement and insight appear normal. Mood & affect  appropriate.     Data Reviewed: I have personally reviewed following labs and imaging studies  CBC: Recent Labs  Lab 04/27/20 0018  WBC 12.5*  HGB 10.2*  HCT 33.1*  MCV 108.2*  PLT 841*   Basic Metabolic Panel: Recent Labs  Lab 04/27/20 0018  NA 136  K 2.6*  CL 98  CO2 31  GLUCOSE 101*  BUN 11  CREATININE 0.77  CALCIUM 7.2*   GFR: Estimated Creatinine Clearance: 79.2 mL/min (by C-G formula based on SCr of 0.77 mg/dL). Liver Function Tests: No results for input(s): AST, ALT, ALKPHOS, BILITOT, PROT, ALBUMIN in the last 168 hours. No results for input(s): LIPASE, AMYLASE in the last 168 hours. No results for input(s): AMMONIA in the last 168 hours. Coagulation Profile: No results for input(s): INR, PROTIME in the last 168 hours. Cardiac Enzymes: No results for input(s): CKTOTAL, CKMB, CKMBINDEX, TROPONINI in the last 168 hours. BNP (last 3 results) No results for input(s): PROBNP in the last 8760 hours. HbA1C: No results for input(s): HGBA1C in the last 72 hours. CBG: No results for input(s): GLUCAP in the last 168 hours. Lipid Profile: No results for input(s): CHOL, HDL, LDLCALC, TRIG, CHOLHDL, LDLDIRECT in the last 72 hours. Thyroid Function Tests: No results for input(s): TSH, T4TOTAL, FREET4, T3FREE, THYROIDAB in the last 72 hours. Anemia Panel: No results for input(s): VITAMINB12, FOLATE, FERRITIN, TIBC, IRON, RETICCTPCT in the last 72 hours. Sepsis Labs: No results for input(s): PROCALCITON, LATICACIDVEN in the last 168 hours.  No results found for this or any previous visit (from the past 240 hour(s)).       Radiology Studies: DG HIP PORT UNILAT WITH PELVIS 1V LEFT  Result Date: 04/26/2020 CLINICAL DATA:  Infection associated with internal left hip prosthesis, subsequent encounter. EXAM: DG HIP (WITH OR WITHOUT PELVIS) 1V PORT LEFT COMPARISON:  Left hip radiograph 04/09/2020 FINDINGS: Left hip arthroplasty in place. There is no convincing  periprosthetic lucency. Heterotopic calcification adjacent to the greater trochanter. Pubic rami are intact. Postsurgical change in the included lumbar spine. The bones are under mineralized. IMPRESSION: Left hip arthroplasty in place. No convincing periprosthetic lucency. Electronically Signed   By: Keith Rake M.D.   On: 04/26/2020 19:17        Scheduled Meds: . atorvastatin  10 mg Oral QHS  . bictegravir-emtricitabine-tenofovir AF  1 tablet Oral Daily  . ciclopirox   Topical BID  . cycloSPORINE  1 drop Both Eyes BID  . darunavir-cobicistat  1 tablet Oral Q breakfast  . enoxaparin (LOVENOX) injection  40 mg Subcutaneous Q24H  . escitalopram  20 mg Oral Daily  . ezetimibe  10 mg Oral QHS  . fluocinonide cream  1 application Topical BID  . folic acid  1 mg Oral Daily  . l-methylfolate-B6-B12  1 tablet Oral BID  . leflunomide  20 mg Oral Daily  . metoprolol succinate  50 mg Oral Daily  . Pirfenidone  1 tablet Oral QHS  . potassium chloride SA  40 mEq Oral TID  . predniSONE  5 mg Oral Q breakfast  . pregabalin  200 mg Oral BID  . sulfaSALAzine  500 mg Oral BID  . tamsulosin  0.4 mg Oral QPC supper  . thiamine  100 mg Oral QODAY  . torsemide  20 mg Oral Daily  . valACYclovir  500 mg Oral Daily   Continuous Infusions:   LOS: 1 day    Time spent: 30 minutes    Barb Merino, MD [facility] Pager [phone]

## 2020-04-27 NOTE — Progress Notes (Addendum)
CRITICAL VALUE ALERT  Critical Value: Potassium: 2.6  Date & Time Notied: 04/27/2020 at [place]   Provider Notified: Dr. Kennon Holter   Orders Received/Actions taken: x3 56meq IV KCL order received.

## 2020-04-27 NOTE — Plan of Care (Signed)
  Problem: Education: Goal: Knowledge of General Education information will improve Description: Including pain rating scale, medication(s)/side effects and non-pharmacologic comfort measures Outcome: Progressing   Problem: Clinical Measurements: Goal: Ability to maintain clinical measurements within normal limits will improve Outcome: Progressing Goal: Will remain free from infection Outcome: Progressing Goal: Diagnostic test results will improve Outcome: Progressing Goal: Respiratory complications will improve Outcome: Progressing Goal: Cardiovascular complication will be avoided Outcome: Progressing   Problem: Nutrition: Goal: Adequate nutrition will be maintained Outcome: Progressing   

## 2020-04-27 NOTE — Anesthesia Preprocedure Evaluation (Addendum)
Anesthesia Evaluation    History of Anesthesia Complications (+) PONV  Airway Mallampati: II       Dental  (+) Teeth Intact   Pulmonary Oxygen sleep apnea ,  Pulmonary fibrosis on 2L [place] at night   Pulmonary exam normal        Cardiovascular hypertension, Pt. on medications and Pt. on home beta blockers + CAD, + Past MI, + CABG and + DVT   Rhythm:Regular Rate:Normal  CABG 2010, DVT 2008/2009 on chronic anticoagulation   Neuro/Psych Anxiety Depression TIACVA (left facial droop), Residual Symptoms    GI/Hepatic hiatal hernia, GERD  Medicated,(+) Hepatitis -, A  Endo/Other  negative endocrine ROS  Renal/GU negative Renal ROS  negative genitourinary   Musculoskeletal  (+) Arthritis , Osteoarthritis and Rheumatoid disorders,  Fibromyalgia -Left hip replacement x5 with recurrent osteomyelitis   Abdominal Normal abdominal exam  (+)  Abdomen: soft. Bowel sounds: normal.  Peds  Hematology  (+) anemia , HIV, CLL dx 2010   Anesthesia Other Findings   Reproductive/Obstetrics                            Anesthesia Physical Anesthesia Plan  ASA: III  Anesthesia Plan: General   Post-op Pain Management:    Induction: Intravenous  PONV Risk Score and Plan: 3 and Ondansetron and Treatment may vary due to age or medical condition  Airway Management Planned: Mask and Oral ETT  Additional Equipment: None  Intra-op Plan:   Post-operative Plan: Extubation in OR  Informed Consent: I have reviewed the patients History and Physical, chart, labs and discussed the procedure including the risks, benefits and alternatives for the proposed anesthesia with the patient or authorized representative who has indicated his/her understanding and acceptance.     Dental advisory given  Plan Discussed with: CRNA  Anesthesia Plan Comments: (ECHO 07/20: 1. The left ventricle has normal systolic function with an  ejection  fraction of 60-65%. The cavity size was normal. Left ventricular diastolic  Doppler parameters are consistent with impaired relaxation.  2. The right ventricle has normal systolic function. The cavity was  mildly enlarged. There is no increase in right ventricular wall thickness.  3. Right atrial size was mildly dilated.  4. The aortic valve is tricuspid. Mild thickening of the aortic valve.  Moderate calcification of the aortic valve. Aortic valve regurgitation is  mild by color flow Doppler. Mild stenosis of the aortic valve.  5. Peak aortic velocity: 2.39m/s, mean gradient 3mmHg.  6. The aorta is normal in size and structure.  Lab Results      Component                Value               Date                      WBC                      12.5 (H)            04/27/2020                HGB                      10.2 (L)            04/27/2020  HCT                      33.1 (L)            04/27/2020                MCV                      108.2 (H)           04/27/2020                PLT                      103 (L)             04/27/2020           Lab Results      Component                Value               Date                      NA                       136                 04/27/2020                K                        2.6 (LL)            04/27/2020                CO2                      31                  04/27/2020                GLUCOSE                  101 (H)             04/27/2020                BUN                      11                  04/27/2020                CREATININE               0.77                04/27/2020                CALCIUM                  7.2 (L)             04/27/2020                GFRNONAA                 >60  04/27/2020                GFRAA                    100                 12/08/2019          )        Anesthesia Quick Evaluation

## 2020-04-27 NOTE — Progress Notes (Signed)
Patient ID: [name], male   DOB: 11-11-1950, 69 y.o.   MRN: [id number] I have talked to the patient in detail.  He understands that we are proceeding to surgery this evening to perform an irrigation debridement on chronic infected left hip.  Our goal is to retain the implants and just irrigate out the tissues.  We will send off for cultures.  Infectious disease service will also be consulted for duration of IV antibiotics and type of antibiotics.  Informed consent was obtained and the left hip has been marked.

## 2020-04-28 ENCOUNTER — Encounter: Payer: Self-pay | Admitting: Oncology

## 2020-04-28 LAB — CBC WITH DIFFERENTIAL/PLATELET
Abs Immature Granulocytes: 0.13 10*3/uL — ABNORMAL HIGH (ref 0.00–0.07)
Basophils Absolute: 0 10*3/uL (ref 0.0–0.1)
Basophils Relative: 0 %
Eosinophils Absolute: 0 10*3/uL (ref 0.0–0.5)
Eosinophils Relative: 0 %
HCT: 34 % — ABNORMAL LOW (ref 39.0–52.0)
Hemoglobin: 10.6 g/dL — ABNORMAL LOW (ref 13.0–17.0)
Immature Granulocytes: 1 %
Lymphocytes Relative: 52 %
Lymphs Abs: 8.7 10*3/uL — ABNORMAL HIGH (ref 0.7–4.0)
MCH: 33.7 pg (ref 26.0–34.0)
MCHC: 31.2 g/dL (ref 30.0–36.0)
MCV: 107.9 fL — ABNORMAL HIGH (ref 80.0–100.0)
Monocytes Absolute: 5.2 10*3/uL — ABNORMAL HIGH (ref 0.1–1.0)
Monocytes Relative: 31 %
Neutro Abs: 2.7 10*3/uL (ref 1.7–7.7)
Neutrophils Relative %: 16 %
Platelets: 99 10*3/uL — ABNORMAL LOW (ref 150–400)
RBC: 3.15 MIL/uL — ABNORMAL LOW (ref 4.22–5.81)
RDW: 14.8 % (ref 11.5–15.5)
WBC Morphology: ABNORMAL
WBC: 16.7 10*3/uL — ABNORMAL HIGH (ref 4.0–10.5)
nRBC: 0 % (ref 0.0–0.2)

## 2020-04-28 LAB — BASIC METABOLIC PANEL
Anion gap: 6 (ref 5–15)
BUN: 16 mg/dL (ref 8–23)
CO2: 31 mmol/L (ref 22–32)
Calcium: 7.9 mg/dL — ABNORMAL LOW (ref 8.9–10.3)
Chloride: 99 mmol/L (ref 98–111)
Creatinine, Ser: 0.64 mg/dL (ref 0.61–1.24)
GFR, Estimated: >60 mL/min (ref >60)
Glucose, Bld: 170 mg/dL — ABNORMAL HIGH (ref 70–99)
Potassium: 3.8 mmol/L (ref 3.5–5.1)
Sodium: 136 mmol/L (ref 135–145)

## 2020-04-28 LAB — MAGNESIUM: Magnesium: 1.8 mg/dL (ref 1.7–2.4)

## 2020-04-28 LAB — C-REACTIVE PROTEIN: CRP: 13.3 mg/dL — ABNORMAL HIGH (ref <1.0)

## 2020-04-28 LAB — PHOSPHORUS: Phosphorus: 2.9 mg/dL (ref 2.5–4.6)

## 2020-04-28 LAB — SEDIMENTATION RATE: Sed Rate: 35 mm/hr — ABNORMAL HIGH (ref 0–16)

## 2020-04-28 MED ORDER — MAGNESIUM OXIDE 400 (241.3 MG) MG PO TABS
400.0000 mg | ORAL_TABLET | Freq: Two times a day (BID) | ORAL | Status: DC
  Administered 2020-04-28 – 2020-05-01 (×7): 400 mg via ORAL
  Filled 2020-04-28 (×7): qty 1

## 2020-04-28 MED ORDER — SODIUM CHLORIDE 0.9 % IV SOLN
2.0000 g | INTRAVENOUS | Status: DC
  Administered 2020-04-28 – 2020-04-30 (×12): 2 g via INTRAVENOUS
  Filled 2020-04-28 (×10): qty 2000
  Filled 2020-04-28: qty 2
  Filled 2020-04-28 (×4): qty 2000

## 2020-04-28 MED ORDER — SODIUM CHLORIDE 0.9 % IV SOLN: 2.0000 g | Freq: Four times a day (QID) | INTRAVENOUS | Status: DC

## 2020-04-28 NOTE — Op Note (Signed)
NAME: [name], [name]. MEDICAL RECORD TD:[id number] ACCOUNT [id number] DATE OF BIRTH:1950/05/30 FACILITY: [facility] LOCATION: [facility] PHYSICIAN:Sophiea Ueda Kerry [place], MD  OPERATIVE REPORT  DATE OF PROCEDURE:  04/27/2020  PREOPERATIVE DIAGNOSIS:  Chronic left prosthetic hip joint infection.  POSTOPERATIVE DIAGNOSIS:  Chronic left prosthetic hip joint infection.  PROCEDURE:  Irrigation and debridement of left hip joint.  FINDINGS:  Large fluid collection around the left hip with cultures pending.  SURGEON:  Lind Guest. Ninfa Linden, MD  ANESTHESIA:  General.  ESTIMATED BLOOD LOSS:  Less than 100 mL.  COMPLICATIONS:  None.  INDICATIONS:  The patient is a 69 year old gentleman who has a complex history as it relates to his left hip.  He is someone who is immunocompromised and is HIV positive.  He has significant AVN with femoral head collapse of his left hip.  He also has a  fused spine and walks with a severe kyphosis, leaning way over.  He was taken to the operating room last year for a left total hip arthroplasty due to his femoral head collapse and severe pain.  Even though this was done through a direct anterior  approach, he ended up dislocating.  The forces of his kyphosis was certainly difficult for me to overcome with his first hip surgery.  It did eventually take a revision and then eventually another revision for placing a dual mobility cup.  At that point,  I had to place bone graft as well.  After that last surgery, he did develop an infection.  He has been followed by the infectious disease service.  We were at this point trying to chronically suppress his infection.  I had last seen him a month ago and  he was doing well.  He did report some hip pain, but has had some mechanical falls and had a small insufficiency fracture of his sacrum.  He is a very cachectic individual as well.  On Sunday, he started feeling ill and actually had developed an upper  respiratory tract  infection.  He was having nausea and vomiting and went to the emergency room.  He was also complaining of left hip pain.  A CT scan of the left hip did show a large fluid collection consistent with an abscess and concerning for  infection of the prosthesis.  He was eventually transferred to [facility] and now presents to the operating room for irrigation and debridement of his left hip joint.  DESCRIPTION OF PROCEDURE:  After informed consent was obtained and appropriate left hip was marked.  He was brought to the operating room and placed supine on the operating table.  General anesthesia was then obtained.  His left hip was prepped and  draped with DuraPrep and sterile drapes.  Time-out was called.  He was identified as the correct patient, correct left hip.  Of note, there was no hip wounds that are draining or any redness or induration.  I then opened up his previous anterior incision  so I could expose the hip joint.  I dissected the hip joint and found a very large fluid collection consistent with infection.  Although he has been on vancomycin for several days now and has been on suppressive antibiotics, we still sent off for Gram  stain and cultures.  We then thoroughly irrigated the hip joint out with 3 liters normal saline solution.  Once I dried this real well, I placed 1 gram of vancomycin powder into the arthrotomy area.  I then closed the deep tissue over this  with #1 Vicryl  suture followed by 0 Vicryl to close the next layer.  2-0 Vicryl to close the subcutaneous tissue and interrupted 2-0 nylon to close the skin incision.  An Aquacel dressing was applied.  He was awakened, extubated, and taken to recovery room in stable  condition.  All final counts were correct.  There were no complications noted.  HN/NUANCE  D:04/27/2020 T:04/28/2020 JOB:[id number]

## 2020-04-28 NOTE — Progress Notes (Signed)
Patient ID: [name], male   DOB: Jun 27, 1950, 69 y.o.   MRN: [id number] A large fluid collection was found around his left hip prosthesis during surgery last evening.  New cultures were sent off.  The tissue surrounding the hip was thoroughly irrigated.  Vancomycin powder was placed around the arthrotomy area.  I have talked to the patient in detail.  He will need infectious disease service to see him again and likely will need a PICC line and at minimum IV antibiotics for likely the next 6 weeks and then oral suppressive antibiotics in order to hopefully retain the hardware.  The only other option is removing the hardware permanently which would certainly detrimentally affect his mobility and quality of life.  He understands this as well.

## 2020-04-28 NOTE — Progress Notes (Signed)
PROGRESS NOTE    [name]  IPJ:[id number] DOB: [birth year] DOA: 04/26/2020 PCP: [name], MD    Brief Narrative:  69 year old gentleman with history of avascular necrosis of the left hip status post prior hip arthroplasty and prosthetic hip infections, HIV on antiretroviral, interstitial lung disease on 2 L oxygen and rheumatoid arthritis brought to the hospital from [facility] emergency department due to concern about left hip infection.  Total hip arthroplasty in 08/2018, recurrent dislocations, periprosthetic fracture on 02/2019, periprosthetic infection and 10/2019 with Enterococcus faecalis.  Was treated with 6 weeks of IV antibiotics and oral amoxicillin suppression therapy.  Presented back to the hospital with ongoing progressive pain on the left hip.  CT scan was consistent with periprosthetic fluid collection. 12/16, admitted to [facility] 12/17, underwent left hip washout. Blood cultures and surgical cultures negative so far. 12/18, started on ampicillin.   Assessment & Plan:   Principal Problem:   Prosthetic hip infection ([place]) Active Problems:   Osteomyelitis ([place])   Pressure injury of skin  Infection of the prosthetic hip: Currently hemodynamically stable.  12/13, blood cultures from [facility] pending. 12/17 blood cultures negative so far. Surgical cultures pending. As per ID plan, will start patient on ampicillin with previous history of Enterococcus faecalis. Start mobilizing with PT OT, hardware in place. Adequate pain medications.  RSV: Tested in outside ER.  Droplet precautions. Clinically improving.  HIV: Well controlled.  Last CD4 count 1200.  On Biktarvy and Prezcobix.  Electrolyte abnormalities: Replace potassium, magnesium and phosphorus. Recheck levels to ensure stabilization.  Chronic hypoxic respiratory failure due to interstitial lung disease: On 2 L of oxygen and stable. Patient is on chronic maintenance prednisone therapy  that he will continue.   DVT prophylaxis: enoxaparin (LOVENOX) injection 40 mg Start: 04/27/20 1000   Code Status: Full code Family Communication: None Disposition Plan: Status is: Inpatient  Remains inpatient appropriate because:Inpatient level of care appropriate due to severity of illness   Dispo: The patient is from: Home              Anticipated d/c is to: Home with IV antibiotics.              Anticipated d/c date is: 2 to 3 days.              Patient currently is not medically stable to d/c.         Consultants:   Orthopedics  ID  Procedures:   None  Antimicrobials:  Antibiotics Given (last 72 hours)    Date/Time Action Medication Dose Rate   04/26/20 2055 New Bag/Given   vancomycin (VANCOREADY) IVPB 1500 mg/300 mL 1,500 mg 150 mL/hr   04/27/20 1127 Given   bictegravir-emtricitabine-tenofovir AF (BIKTARVY) 50-200-25 MG per tablet 1 tablet 1 tablet    04/27/20 1128 Given   valACYclovir (VALTREX) tablet 500 mg 500 mg    04/27/20 1812 Given  [left hip]   vancomycin (VANCOCIN) powder 1,000 mg    04/28/20 0840 Given   bictegravir-emtricitabine-tenofovir AF (BIKTARVY) 50-200-25 MG per tablet 1 tablet 1 tablet    04/28/20 0843 Given   darunavir-cobicistat (PREZCOBIX) 800-150 MG per tablet 1 tablet 1 tablet    04/28/20 0843 Given   valACYclovir (VALTREX) tablet 500 mg 500 mg          Subjective: Patient seen and examined. No overnight events. Felt relief. His upper airway symptoms have improved and now he feels much better after surgery as well as  improvement of his cough and congestion.  Objective: Vitals:   04/28/20 0208 04/28/20 0209 04/28/20 0631 04/28/20 0838  BP:  121/71 127/74   Pulse:  65 (!) 53 62  Resp:  17 18   Temp:  (!) 97.4 F (36.3 C) 97.7 F (36.5 C)   TempSrc:  Oral Oral   SpO2: 90% 99% 98%   Weight:        Intake/Output Summary (Last 24 hours) at 04/28/2020 0856 Last data filed at 04/28/2020 2671 Gross per 24 hour  Intake  700 ml  Output 1035 ml  Net -335 ml   Filed Weights   04/26/20 2000  Weight: 72.1 kg    Examination:  General exam: Appears calm and comfortable  Looks comfortable on 2 L oxygen. Respiratory system: Clear to auscultation. Respiratory effort normal.  No added sounds. Cardiovascular system: S1 & S2 heard, RRR. No JVD, murmurs, rubs, gallops or clicks. No pedal edema. Gastrointestinal system: Abdomen is nondistended, soft and nontender. No organomegaly or masses felt. Normal bowel sounds heard. Central nervous system: Alert and oriented. No focal neurological deficits. Extremities: Symmetric 5 x 5 power. Surgical dressing present on left lateral thigh, no swelling or erythema. Skin: No rashes, lesions or ulcers Psychiatry: Judgement and insight appear normal. Mood & affect appropriate.     Data Reviewed: I have personally reviewed following labs and imaging studies  CBC: Recent Labs  Lab 04/27/20 0018 04/28/20 0426  WBC 12.5* 16.7*  NEUTROABS  --  2.7  HGB 10.2* 10.6*  HCT 33.1* 34.0*  MCV 108.2* 107.9*  PLT 103* 99*   Basic Metabolic Panel: Recent Labs  Lab 04/27/20 0018 04/28/20 0426  NA 136 136  K 2.6* 3.8  CL 98 99  CO2 31 31  GLUCOSE 101* 170*  BUN 11 16  CREATININE 0.77 0.64  CALCIUM 7.2* 7.9*  MG  --  1.8  PHOS  --  2.9   GFR: Estimated Creatinine Clearance: 79.2 mL/min (by C-G formula based on SCr of 0.64 mg/dL). Liver Function Tests: No results for input(s): AST, ALT, ALKPHOS, BILITOT, PROT, ALBUMIN in the last 168 hours. No results for input(s): LIPASE, AMYLASE in the last 168 hours. No results for input(s): AMMONIA in the last 168 hours. Coagulation Profile: No results for input(s): INR, PROTIME in the last 168 hours. Cardiac Enzymes: No results for input(s): CKTOTAL, CKMB, CKMBINDEX, TROPONINI in the last 168 hours. BNP (last 3 results) No results for input(s): PROBNP in the last 8760 hours. HbA1C: No results for input(s): HGBA1C in the last  72 hours. CBG: No results for input(s): GLUCAP in the last 168 hours. Lipid Profile: No results for input(s): CHOL, HDL, LDLCALC, TRIG, CHOLHDL, LDLDIRECT in the last 72 hours. Thyroid Function Tests: No results for input(s): TSH, T4TOTAL, FREET4, T3FREE, THYROIDAB in the last 72 hours. Anemia Panel: No results for input(s): VITAMINB12, FOLATE, FERRITIN, TIBC, IRON, RETICCTPCT in the last 72 hours. Sepsis Labs: No results for input(s): PROCALCITON, LATICACIDVEN in the last 168 hours.  Recent Results (from the past 240 hour(s))  Aerobic/Anaerobic Culture (surgical/deep wound)     Status: None (Preliminary result)   Collection Time: 04/27/20  6:03 PM   Specimen: PATH Cytology Misc. fluid; Body Fluid  Result Value Ref Range Status   Specimen Description FLUID LEFT HIP  Final   Special Requests SWAB  Final   Gram Stain   Final    MODERATE WBC PRESENT, PREDOMINANTLY PMN FEW IN CLUSTERS Performed at [facility], [address] [place], [place] [address]    Culture PENDING  Incomplete   Report Status PENDING  Incomplete         Radiology Studies: DG HIP PORT UNILAT WITH PELVIS 1V LEFT  Result Date: 04/26/2020 CLINICAL DATA:  Infection associated with internal left hip prosthesis, subsequent encounter. EXAM: DG HIP (WITH OR WITHOUT PELVIS) 1V PORT LEFT COMPARISON:  Left hip radiograph 04/09/2020 FINDINGS: Left hip arthroplasty in place. There is no convincing periprosthetic lucency. Heterotopic calcification adjacent to the greater trochanter. Pubic rami are intact. Postsurgical change in the included lumbar spine. The bones are under mineralized. IMPRESSION: Left hip arthroplasty in place. No convincing periprosthetic lucency. Electronically Signed   By: Keith Rake M.D.   On: 04/26/2020 19:17        Scheduled Meds: . atorvastatin  10 mg Oral QHS  . bictegravir-emtricitabine-tenofovir AF  1 tablet Oral Daily  . cycloSPORINE  1 drop Both Eyes BID  .  darunavir-cobicistat  1 tablet Oral Q breakfast  . enoxaparin (LOVENOX) injection  40 mg Subcutaneous Q24H  . escitalopram  20 mg Oral Daily  . ezetimibe  10 mg Oral QHS  . fluocinonide cream  1 application Topical BID  . folic acid  1 mg Oral Daily  . l-methylfolate-B6-B12  1 tablet Oral BID  . leflunomide  20 mg Oral Daily  . magnesium oxide  400 mg Oral BID  . metoprolol succinate  50 mg Oral Daily  . Pirfenidone  1 tablet Oral QHS  . potassium chloride SA  40 mEq Oral TID  . predniSONE  5 mg Oral Q breakfast  . pregabalin  200 mg Oral BID  . sulfaSALAzine  500 mg Oral BID  . tamsulosin  0.4 mg Oral QPC supper  . thiamine  100 mg Oral QODAY  . torsemide  20 mg Oral Daily  . valACYclovir  500 mg Oral Daily   Continuous Infusions: . ampicillin (OMNIPEN) IV       LOS: 2 days    Time spent: 30 minutes    Barb Merino, MD [facility] Pager [phone]

## 2020-04-28 NOTE — Progress Notes (Signed)
PHARMACY NOTE:  ANTIMICROBIAL RENAL DOSAGE ADJUSTMENT  Current antimicrobial regimen includes a mismatch between antimicrobial dosage and estimated renal function.  As per policy approved by the Pharmacy & Therapeutics and Medical Executive Committees, the antimicrobial dosage will be adjusted accordingly.  Current antimicrobial dosage:  Ampicillin 2g q6h  Indication: Osteomyelitis  Renal Function:  Estimated Creatinine Clearance: 79.2 mL/min (by C-G formula based on SCr of 0.64 mg/dL). []      On intermittent HD, scheduled: []      On CRRT    Antimicrobial dosage has been changed to:  Ampicillin 2g q4h  Thank you for allowing pharmacy to be a part of this patient's care.  Esmond Plants, [facility] 04/28/2020 9:18 AM

## 2020-04-28 NOTE — Evaluation (Signed)
Physical Therapy Evaluation Patient Details Name: [name] MRN: [id number] DOB: 1950-11-14 Today's Date: 04/28/2020   History of Present Illness  The pt is a 69 yo male presenting after I&D of L hip prothesis on 12/17. PMH includes: multiple L hip replacements and recent infection in June (s/p 3 months antibiotics), HIV on HAART, CLL, ILD with chronic hypoxic respite failure on 2 L O2 via nasal cannula, rheumatoid arthritis, and history of DVT on prophylaxis dose of edoxaban.  Clinical Impression  Pt in bed upon arrival of PT, agreeable to evaluation at this time. Prior to admission the pt was mobilizing with use of platform RW or motorized scooter prior to admission, but is independent with ADLs and home management. The pt now presents with limitations in functional mobility, strength, endurance, and dynamic stability due to above dx and general debility, and will continue to benefit from skilled PT to address these deficits. The pt was able to demo good bed mobility and initial transfers with minimal assist, and complete short bout of ambulation in the room without LOB. He did have slight desat to SpO2 of 89% on RA, but recovered to 94% with seated rest. Discussed need for further assessment of need for supplemental O2 with exertion and role PT can play in improving endurance/safely progressing exercise tolerance. The pt will continue to benefit from skilled PT acutely and following d/c to progress deficits and improve safety with mobility to allow for full return to independence at home with reduced risk of falls.      Follow Up Recommendations Home health PT;Supervision - Intermittent (recommend HHPT or eventual aquatic PT due to pt's affinity for water based exercise, but pt stated multiple times that he does not want follow up therapies)    Equipment Recommendations  None recommended by PT    Recommendations for Other Services       Precautions / Restrictions  Precautions Precautions: Fall Precaution Comments: watch O2 Restrictions Weight Bearing Restrictions: No      Mobility  Bed Mobility Overal bed mobility: Modified Independent             General bed mobility comments: increased time, use of bed rails. pt able to manage BLE to EOB independently    Transfers Overall transfer level: Needs assistance Equipment used: Rolling walker (2 wheeled) Transfers: Sit to/from Stand Sit to Stand: Min assist         General transfer comment: minA to minG for safety. pt with increased time to stand  Ambulation/Gait Ambulation/Gait assistance: Min guard Gait Distance (Feet): 30 Feet Assistive device: Rolling walker (2 wheeled) Gait Pattern/deviations: Step-through pattern;Decreased stride length;Trunk flexed Gait velocity: decreased Gait velocity interpretation: <1.31 ft/sec, indicative of household ambulator General Gait Details: pt with significant trunk flexion, reports this is his baseline. no LOB with gait. SpO2 89% on RA following walk  Stairs            Wheelchair Mobility    Modified Rankin (Stroke Patients Only)       Balance Overall balance assessment: Needs assistance Sitting-balance support: No upper extremity supported Sitting balance-[name]: Fair     Standing balance support: Bilateral upper extremity supported Standing balance-[name]: Poor Standing balance comment: reliant on BUE support                             Pertinent Vitals/Pain Pain Assessment: 0-10 Pain Score: 5  Pain Location: mainly L hip, also all other  joints Pain Descriptors / Indicators: Aching;Discomfort Pain Intervention(s): Limited activity within patient's tolerance;Monitored during session;Repositioned    Home Living Family/patient expects to be discharged to:: Private residence Living Arrangements: Alone Available Help at Discharge: Family;Available 24 hours/day Type of Home: Mobile home Home Access:  Ramped entrance     Home Layout: One level Home Equipment: [place] - single point;Shower seat - built in;Grab bars - tub/shower;Grab bars - toilet;Hand held shower head;Wheelchair - power;Electric scooter (Landscape architect)      Prior Function Level of Independence: Independent with assistive device(s)         Comments: uses can at times, but mostly platform walker in the home due to balance difficulties. Uses power wheelchair or scooter for community     Hand Dominance   Dominant Hand: Right    Extremity/Trunk Assessment   Upper Extremity Assessment Upper Extremity Assessment: Generalized weakness (severe RA bilaterally with slightly weakened grips)    Lower Extremity Assessment Lower Extremity Assessment: Generalized weakness;LLE deficits/detail LLE Deficits / Details: unable to SLR or hip flex in supine, able to use functionally with gait LLE Sensation: WNL LLE Coordination: decreased fine motor    Cervical / Trunk Assessment Cervical / Trunk Assessment: Kyphotic  Communication   Communication: No difficulties  Cognition Arousal/Alertness: Awake/alert Behavior During Therapy: WFL for tasks assessed/performed Overall Cognitive Status: Within Functional Limits for tasks assessed                                        General Comments General comments (skin integrity, edema, etc.): VSS at rest on RA (94%), desat to 89% on RA with gait in room, increased to 94% after seated rest on RA. RN alerted and left on 1L for comfort    Exercises     Assessment/Plan    PT Assessment Patient needs continued PT services  PT Problem List Decreased strength;Decreased range of motion;Decreased activity tolerance;Decreased balance;Decreased mobility;Decreased coordination;Decreased cognition;Decreased knowledge of use of DME;Pain       PT Treatment Interventions DME instruction;Gait training;Stair training;Functional mobility training;Therapeutic  activities;Therapeutic exercise;Balance training;Patient/family education    PT Goals (Current goals can be found in the Care Plan section)  Acute Rehab PT Goals Patient Stated Goal: return home, start working out in pool PT Goal Formulation: With patient Time For Goal Achievement: 05/12/20 Potential to Achieve Goals: Fair    Frequency Min 3X/week    AM-PAC PT "6 Clicks" Mobility  Outcome Measure Help needed turning from your back to your side while in a flat bed without using bedrails?: None Help needed moving from lying on your back to sitting on the side of a flat bed without using bedrails?: None Help needed moving to and from a bed to a chair (including a wheelchair)?: A Little Help needed standing up from a chair using your arms (e.g., wheelchair or bedside chair)?: A Little Help needed to walk in hospital room?: A Little Help needed climbing 3-5 steps with a railing? : A Lot 6 Click Score: 19    End of Session Equipment Utilized During Treatment: Gait belt Activity Tolerance: Patient tolerated treatment well Patient left: in bed;with call bell/phone within reach;with nursing/sitter in room (sitting EOB) Nurse Communication: Mobility status PT Visit Diagnosis: Other abnormalities of gait and mobility (R26.89);Muscle weakness (generalized) (M62.81)    Time: 1751-0258 PT Time Calculation (min) (ACUTE ONLY): 33 min   Charges:   PT Evaluation $PT Eval  Low Complexity: 1 Low PT Treatments $Gait Training: 8-22 mins        Karma Ganja, PT, DPT   Acute Rehabilitation Department Pager #: [phone]  Otho Bellows 04/28/2020, 4:15 PM

## 2020-04-29 LAB — BASIC METABOLIC PANEL
Anion gap: 9 (ref 5–15)
BUN: 18 mg/dL (ref 8–23)
CO2: 31 mmol/L (ref 22–32)
Calcium: 7.9 mg/dL — ABNORMAL LOW (ref 8.9–10.3)
Chloride: 98 mmol/L (ref 98–111)
Creatinine, Ser: 0.73 mg/dL (ref 0.61–1.24)
GFR, Estimated: >60 mL/min (ref >60)
Glucose, Bld: 133 mg/dL — ABNORMAL HIGH (ref 70–99)
Potassium: 3.6 mmol/L (ref 3.5–5.1)
Sodium: 138 mmol/L (ref 135–145)

## 2020-04-29 LAB — CBC WITH DIFFERENTIAL/PLATELET
Abs Immature Granulocytes: 0.21 10*3/uL — ABNORMAL HIGH (ref 0.00–0.07)
Basophils Absolute: 0 10*3/uL (ref 0.0–0.1)
Basophils Relative: 0 %
Eosinophils Absolute: 0 10*3/uL (ref 0.0–0.5)
Eosinophils Relative: 0 %
HCT: 28.5 % — ABNORMAL LOW (ref 39.0–52.0)
Hemoglobin: 9.3 g/dL — ABNORMAL LOW (ref 13.0–17.0)
Immature Granulocytes: 1 %
Lymphocytes Relative: 58 %
Lymphs Abs: 9 10*3/uL — ABNORMAL HIGH (ref 0.7–4.0)
MCH: 34.7 pg — ABNORMAL HIGH (ref 26.0–34.0)
MCHC: 32.6 g/dL (ref 30.0–36.0)
MCV: 106.3 fL — ABNORMAL HIGH (ref 80.0–100.0)
Monocytes Absolute: 3.4 10*3/uL — ABNORMAL HIGH (ref 0.1–1.0)
Monocytes Relative: 22 %
Neutro Abs: 2.9 10*3/uL (ref 1.7–7.7)
Neutrophils Relative %: 19 %
Platelets: 109 10*3/uL — ABNORMAL LOW (ref 150–400)
RBC: 2.68 MIL/uL — ABNORMAL LOW (ref 4.22–5.81)
RDW: 14.6 % (ref 11.5–15.5)
WBC: 15.4 10*3/uL — ABNORMAL HIGH (ref 4.0–10.5)
nRBC: 0 % (ref 0.0–0.2)

## 2020-04-29 MED ORDER — PIRFENIDONE 801 MG PO TABS
1.0000 | ORAL_TABLET | Freq: Three times a day (TID) | ORAL | Status: DC
  Administered 2020-04-29 (×2): 1 via ORAL
  Filled 2020-04-29 (×3): qty 1

## 2020-04-29 NOTE — Progress Notes (Signed)
PROGRESS NOTE    [name]  EXB:[id number] DOB: 05-05-1951 DOA: 04/26/2020 PCP: [name], MD    Brief Narrative:  69 year old gentleman with history of avascular necrosis of the left hip status post prior hip arthroplasty and prosthetic hip infections, HIV on antiretroviral, interstitial lung disease on 2 L oxygen and rheumatoid arthritis brought to the hospital from [facility] emergency department due to concern about left hip infection.  Total hip arthroplasty in 08/2018, recurrent dislocations, periprosthetic fracture on 02/2019, periprosthetic infection and 10/2019 with Enterococcus faecalis.  Was treated with 6 weeks of IV antibiotics and oral amoxicillin suppression therapy.  Presented back to the hospital with ongoing progressive pain on the left hip.  CT scan was consistent with periprosthetic fluid collection. 12/16, admitted to [facility] 12/17, underwent left hip washout. Blood cultures and surgical cultures negative so far. 12/18, started on ampicillin.   Assessment & Plan:   Principal Problem:   Prosthetic hip infection ([place]) Active Problems:   Osteomyelitis ([place])   Pressure injury of skin  Infection of the prosthetic hip: Currently hemodynamically stable.  12/13, blood cultures from [facility] pending. 12/17 blood cultures negative so far.  Surgical cultures with gram-positive cocci. As per ID plan, will start patient on ampicillin with previous history of Enterococcus faecalis. Start mobilizing with PT OT, hardware in place. Adequate pain medications. ID to decide about PICC line and antibiotics.  RSV: Tested in outside ER.  Droplet precautions. Clinically improving.  HIV: Well controlled.  Last CD4 count 1200.  On Biktarvy and Prezcobix.  Electrolyte abnormalities: Replaced and adequate..  Chronic hypoxic respiratory failure due to interstitial lung disease: On 2 L of oxygen and stable. Patient is on chronic maintenance prednisone  therapy that he will continue.   DVT prophylaxis: enoxaparin (LOVENOX) injection 40 mg Start: 04/27/20 1000   Code Status: Full code Family Communication: None Disposition Plan: Status is: Inpatient  Remains inpatient appropriate because:Inpatient level of care appropriate due to severity of illness   Dispo: The patient is from: Home              Anticipated d/c is to: Home with IV antibiotics.              Anticipated d/c date is: 2 to 3 days.              Patient currently is not medically stable to d/c.  Final antibiotic plan pending.       Consultants:   Orthopedics  ID  Procedures:   None  Antimicrobials:  Antibiotics Given (last 72 hours)    Date/Time Action Medication Dose Rate   04/26/20 2055 New Bag/Given   vancomycin (VANCOREADY) IVPB 1500 mg/300 mL 1,500 mg 150 mL/hr   04/27/20 1127 Given   bictegravir-emtricitabine-tenofovir AF (BIKTARVY) 50-200-25 MG per tablet 1 tablet 1 tablet    04/27/20 1128 Given   valACYclovir (VALTREX) tablet 500 mg 500 mg    04/27/20 1812 Given  [left hip]   vancomycin (VANCOCIN) powder 1,000 mg    04/28/20 0840 Given   bictegravir-emtricitabine-tenofovir AF (BIKTARVY) 50-200-25 MG per tablet 1 tablet 1 tablet    04/28/20 0843 Given   darunavir-cobicistat (PREZCOBIX) 800-150 MG per tablet 1 tablet 1 tablet    04/28/20 0843 Given   valACYclovir (VALTREX) tablet 500 mg 500 mg    04/28/20 1458 New Bag/Given   ampicillin (OMNIPEN) 2 g in sodium chloride 0.9 % 100 mL IVPB 2 g 300 mL/hr   04/28/20 1800  New Bag/Given   ampicillin (OMNIPEN) 2 g in sodium chloride 0.9 % 100 mL IVPB 2 g 300 mL/hr   04/28/20 2046 New Bag/Given   ampicillin (OMNIPEN) 2 g in sodium chloride 0.9 % 100 mL IVPB 2 g 300 mL/hr   04/28/20 2340 New Bag/Given   ampicillin (OMNIPEN) 2 g in sodium chloride 0.9 % 100 mL IVPB 2 g 300 mL/hr   04/29/20 0455 New Bag/Given   ampicillin (OMNIPEN) 2 g in sodium chloride 0.9 % 100 mL IVPB 2 g 300 mL/hr   04/29/20  5638 New Bag/Given   ampicillin (OMNIPEN) 2 g in sodium chloride 0.9 % 100 mL IVPB 2 g 300 mL/hr   04/29/20 7564 Given   valACYclovir (VALTREX) tablet 500 mg 500 mg    04/29/20 3329 Given   bictegravir-emtricitabine-tenofovir AF (BIKTARVY) 50-200-25 MG per tablet 1 tablet 1 tablet    04/29/20 5188 Given   darunavir-cobicistat (PREZCOBIX) 800-150 MG per tablet 1 tablet 1 tablet    04/29/20 1106 New Bag/Given   ampicillin (OMNIPEN) 2 g in sodium chloride 0.9 % 100 mL IVPB 2 g 300 mL/hr         Subjective: Patient seen and examined.  No overnight events.  Breathing better and cough and congestion better. Patient had a lot of question about why he is back on ampicillin when he got repeated infection after treatment with ampicillin. I described to him in details why a prosthetic joint infection will always be infected but in the more less extent all his life.  I told him that he will probably need lifelong suppressive antibiotic therapy and infectious disease specialist will explain to him about the type of antibiotics. He is happy with the results and looking forward to go home with IV antibiotics.  Objective: Vitals:   04/28/20 1336 04/28/20 2049 04/29/20 0452 04/29/20 1321  BP: 115/69 120/72 118/73 (!) 104/56  Pulse: 66 64 60 67  Resp: 20 18 18 18   Temp: (!) 97.5 F (36.4 C) 97.9 F (36.6 C) 97.9 F (36.6 C) 98.2 F (36.8 C)  TempSrc: Oral Oral Oral Oral  SpO2: 98% 96% 94% 94%  Weight:        Intake/Output Summary (Last 24 hours) at 04/29/2020 1339 Last data filed at 04/29/2020 0503 Gross per 24 hour  Intake 8.75 ml  Output 800 ml  Net -791.25 ml   Filed Weights   04/26/20 2000  Weight: 72.1 kg    Examination:  General exam: Appears calm and comfortable  Looks comfortable on 2 L oxygen. Respiratory system: Clear to auscultation. Respiratory effort normal.  No added sounds. Cardiovascular system: S1 & S2 heard, RRR. No JVD, murmurs, rubs, gallops or clicks. No pedal  edema. Gastrointestinal system: Abdomen is nondistended, soft and nontender. No organomegaly or masses felt. Normal bowel sounds heard. Central nervous system: Alert and oriented. No focal neurological deficits. Extremities: Symmetric 5 x 5 power. Surgical dressing present on left lateral thigh, no swelling or erythema. Skin: No rashes, lesions or ulcers Psychiatry: Judgement and insight appear normal. Mood & affect appropriate.     Data Reviewed: I have personally reviewed following labs and imaging studies  CBC: Recent Labs  Lab 04/27/20 0018 04/28/20 0426 04/29/20 0149  WBC 12.5* 16.7* 15.4*  NEUTROABS  --  2.7 2.9  HGB 10.2* 10.6* 9.3*  HCT 33.1* 34.0* 28.5*  MCV 108.2* 107.9* 106.3*  PLT 103* 99* 416*   Basic Metabolic Panel: Recent Labs  Lab 04/27/20 0018 04/28/20 0426 04/29/20  0149  NA 136 136 138  K 2.6* 3.8 3.6  CL 98 99 98  CO2 31 31 31   GLUCOSE 101* 170* 133*  BUN 11 16 18   CREATININE 0.77 0.64 0.73  CALCIUM 7.2* 7.9* 7.9*  MG  --  1.8  --   PHOS  --  2.9  --    GFR: Estimated Creatinine Clearance: 79.2 mL/min (by C-G formula based on SCr of 0.73 mg/dL). Liver Function Tests: No results for input(s): AST, ALT, ALKPHOS, BILITOT, PROT, ALBUMIN in the last 168 hours. No results for input(s): LIPASE, AMYLASE in the last 168 hours. No results for input(s): AMMONIA in the last 168 hours. Coagulation Profile: No results for input(s): INR, PROTIME in the last 168 hours. Cardiac Enzymes: No results for input(s): CKTOTAL, CKMB, CKMBINDEX, TROPONINI in the last 168 hours. BNP (last 3 results) No results for input(s): PROBNP in the last 8760 hours. HbA1C: No results for input(s): HGBA1C in the last 72 hours. CBG: No results for input(s): GLUCAP in the last 168 hours. Lipid Profile: No results for input(s): CHOL, HDL, LDLCALC, TRIG, CHOLHDL, LDLDIRECT in the last 72 hours. Thyroid Function Tests: No results for input(s): TSH, T4TOTAL, FREET4, T3FREE,  THYROIDAB in the last 72 hours. Anemia Panel: No results for input(s): VITAMINB12, FOLATE, FERRITIN, TIBC, IRON, RETICCTPCT in the last 72 hours. Sepsis Labs: No results for input(s): PROCALCITON, LATICACIDVEN in the last 168 hours.  Recent Results (from the past 240 hour(s))  Culture, blood (routine x 2)     Status: None (Preliminary result)   Collection Time: 04/27/20  8:48 AM   Specimen: BLOOD  Result Value Ref Range Status   Specimen Description BLOOD BLOOD RIGHT HAND  Final   Special Requests   Final    BOTTLES DRAWN AEROBIC AND ANAEROBIC Blood Culture adequate volume   Culture   Final    NO GROWTH 2 DAYS Performed at [facility], [address] [place], [place] [address]    Report Status PENDING  Incomplete  Culture, blood (routine x 2)     Status: None (Preliminary result)   Collection Time: 04/27/20  8:48 AM   Specimen: BLOOD  Result Value Ref Range Status   Specimen Description BLOOD RIGHT ANTECUBITAL  Final   Special Requests   Final    BOTTLES DRAWN AEROBIC AND ANAEROBIC Blood Culture results may not be optimal due to an excessive volume of blood received in culture bottles   Culture   Final    NO GROWTH 2 DAYS Performed at [facility], [place] [address], [place], [place] [address]    Report Status PENDING  Incomplete  Aerobic/Anaerobic Culture (surgical/deep wound)     Status: None (Preliminary result)   Collection Time: 04/27/20  6:03 PM   Specimen: PATH Cytology Misc. fluid; Body Fluid  Result Value Ref Range Status   Specimen Description FLUID LEFT HIP  Final   Special Requests SWAB  Final   Gram Stain   Final    MODERATE WBC PRESENT, PREDOMINANTLY PMN FEW GRAM POSITIVE COCCI IN CLUSTERS    Culture   Final    CULTURE REINCUBATED FOR BETTER GROWTH Performed at [facility], [place] [address] [place], [place] [address]    Report Status PENDING  Incomplete         Radiology Studies: No results found.      Scheduled Meds: .  atorvastatin  10 mg Oral QHS  . bictegravir-emtricitabine-tenofovir AF  1 tablet Oral  Daily  . cycloSPORINE  1 drop Both Eyes BID  . darunavir-cobicistat  1 tablet Oral Q breakfast  . enoxaparin (LOVENOX) injection  40 mg Subcutaneous Q24H  . escitalopram  20 mg Oral Daily  . ezetimibe  10 mg Oral QHS  . fluocinonide cream  1 application Topical BID  . folic acid  1 mg Oral Daily  . l-methylfolate-B6-B12  1 tablet Oral BID  . leflunomide  20 mg Oral Daily  . magnesium oxide  400 mg Oral BID  . metoprolol succinate  50 mg Oral Daily  . Pirfenidone  1 tablet Oral TID  . predniSONE  5 mg Oral Q breakfast  . pregabalin  200 mg Oral BID  . sulfaSALAzine  500 mg Oral BID  . tamsulosin  0.4 mg Oral QPC supper  . thiamine  100 mg Oral QODAY  . torsemide  20 mg Oral Daily  . valACYclovir  500 mg Oral Daily   Continuous Infusions: . ampicillin (OMNIPEN) IV 2 g (04/29/20 1106)     LOS: 3 days    Time spent: 30 minutes    Barb Merino, MD [facility] Pager [phone]

## 2020-04-29 NOTE — Evaluation (Signed)
Occupational Therapy Evaluation Patient Details Name: [name] MRN: [id number] DOB: [birth year] Today's Date: 04/29/2020    History of Present Illness The pt is a 69 yo male presenting after I&D of L hip prothesis on 12/17. PMH includes: multiple L hip replacements and recent infection in June (s/p 3 months antibiotics), HIV on HAART, CLL, ILD with chronic hypoxic respite failure on 2 L O2 via nasal cannula, rheumatoid arthritis, and history of DVT on prophylaxis dose of edoxaban.   Clinical Impression   [name] is a 69 year old man who presents s/p I & D of left hip. On evaluation patient reports improvement of pain compared to before surgery, demonstrates ability to perform bed mobility, ambulation with RW and ability to perform toileting and lower body dressing without assistance. Patient reports having all DME needed at home including AE. Patient reports being at his baseline and even better due to improved pain. No OT needs at this time.    Follow Up Recommendations  No OT follow up    Equipment Recommendations  None recommended by OT    Recommendations for Other Services       Precautions / Restrictions Precautions Precautions: Fall Restrictions Weight Bearing Restrictions: No      Mobility Bed Mobility Overal bed mobility: Modified Independent                  Transfers Overall transfer level: Needs assistance Equipment used: Rolling walker (2 wheeled) Transfers: Sit to/from Stand Sit to Stand: Supervision         General transfer comment: Supervision for sit to stand, ambulation in room and transfer to recliner with RW.    Balance Overall balance assessment: Needs assistance Sitting-balance support: No upper extremity supported;Feet supported Sitting balance-[name]: Good     Standing balance support: During functional activity Standing balance-[name]: Fair Standing balance comment: Reliant on walker with ambulation. can take  hands off of walker for ADLs.                           ADL either performed or assessed with clinical judgement   ADL Overall ADL's : At baseline                                       General ADL Comments: Demonstrates ability to don socks, ambulate to bathroom, perform toileting and standing at sink to perform grooming task. Patient reports he is at his baseline.     Vision Patient Visual Report: No change from baseline       Perception     Praxis      Pertinent Vitals/Pain Pain Assessment: No/denies pain     Hand Dominance Right   Extremity/Trunk Assessment Upper Extremity Assessment Upper Extremity Assessment: Overall WFL for tasks assessed   Lower Extremity Assessment Lower Extremity Assessment: Defer to PT evaluation   Cervical / Trunk Assessment Cervical / Trunk Assessment: Kyphotic   Communication Communication Communication: No difficulties   Cognition Arousal/Alertness: Awake/alert Behavior During Therapy: WFL for tasks assessed/performed Overall Cognitive Status: Within Functional Limits for tasks assessed                                     General Comments       Exercises  Shoulder Instructions      Home Living Family/patient expects to be discharged to:: Private residence Living Arrangements: Alone Available Help at Discharge: Family;Available 24 hours/day Type of Home: Mobile home Home Access: Ramped entrance     Home Layout: One level     Bathroom Shower/Tub: Occupational psychologist: Handicapped height Bathroom Accessibility: Yes   Home Equipment: [place] - single point;Shower seat - built in;Grab bars - tub/shower;Grab bars - toilet;Hand held shower head;Wheelchair - power;Electric scooter;Adaptive equipment (platform walker) Adaptive Equipment: Reacher;Sock aid;Long-handled shoe horn        Prior Functioning/Environment Level of Independence: Independent with assistive  device(s)        Comments: uses can at times, but mostly platform walker in the home due to balance difficulties. Uses power wheelchair or scooter for community, independent with ADLs, has AE if needed, gets groceries delivered        OT Problem List:        OT Treatment/Interventions:      OT Goals(Current goals can be found in the care plan section) Acute Rehab OT Goals OT Goal Formulation: All assessment and education complete, DC therapy  OT Frequency:     Barriers to D/C:            Co-evaluation              AM-PAC OT "6 Clicks" Daily Activity     Outcome Measure Help from another person eating meals?: None Help from another person taking care of personal grooming?: None Help from another person toileting, which includes using toliet, bedpan, or urinal?: None Help from another person bathing (including washing, rinsing, drying)?: None Help from another person to put on and taking off regular upper body clothing?: None Help from another person to put on and taking off regular lower body clothing?: None 6 Click Score: 24   End of Session Equipment Utilized During Treatment: Rolling walker Nurse Communication:  (okay to see per RN)  Activity Tolerance: Patient tolerated treatment well Patient left: in chair;with call bell/phone within reach  OT Visit Diagnosis: Other abnormalities of gait and mobility (R26.89);Pain Pain - Right/Left: Left Pain - part of body: Hip                Time: 8937-3428 OT Time Calculation (min): 13 min Charges:  OT General Charges $OT Visit: 1 Visit OT Evaluation $OT Eval Low Complexity: 1 Low  [name], OTR/L [place] [phone] Pager: [place] 04/29/2020, 4:07 PM

## 2020-04-29 NOTE — Progress Notes (Signed)
   Subjective: 2 Days Post-Op Procedure(s) (LRB): IRRIGATION AND DEBRIDEMENT HIP (Left) Patient reports pain as mild and moderate.    Objective: Vital signs in last 24 hours: Temp:  [97.5 F (36.4 C)-97.9 F (36.6 C)] 97.9 F (36.6 C) (12/19 0452) Pulse Rate:  [60-66] 60 (12/19 0452) Resp:  [18-20] 18 (12/19 0452) BP: (115-120)/(69-73) 118/73 (12/19 0452) SpO2:  [94 %-98 %] 94 % (12/19 0452)  Intake/Output from previous day: 12/18 0701 - 12/19 0700 In: 8.8 [IV Piggyback:8.8] Out: 1000 [Urine:1000] Intake/Output this shift: No intake/output data recorded.  Recent Labs    04/27/20 0018 04/28/20 0426 04/29/20 0149  HGB 10.2* 10.6* 9.3*   Recent Labs    04/28/20 0426 04/29/20 0149  WBC 16.7* 15.4*  RBC 3.15* 2.68*  HCT 34.0* 28.5*  PLT 99* 109*   Recent Labs    04/28/20 0426 04/29/20 0149  NA 136 138  K 3.8 3.6  CL 99 98  CO2 31 31  BUN 16 18  CREATININE 0.64 0.73  GLUCOSE 170* 133*  CALCIUM 7.9* 7.9*   No results for input(s): LABPT, INR in the last 72 hours.  Neurologically intact No results found.  Assessment/Plan: 2 Days Post-Op Procedure(s) (LRB): IRRIGATION AND DEBRIDEMENT HIP (Left) Up with therapy; continue ABX per ID team  Marybelle Killings 04/29/2020, 11:28 AM

## 2020-04-30 ENCOUNTER — Inpatient Hospital Stay: Payer: Self-pay

## 2020-04-30 ENCOUNTER — Encounter (HOSPITAL_COMMUNITY): Payer: Self-pay | Admitting: Orthopaedic Surgery

## 2020-04-30 DIAGNOSIS — B9689 Other specified bacterial agents as the cause of diseases classified elsewhere: Secondary | ICD-10-CM

## 2020-04-30 DIAGNOSIS — M86252 Subacute osteomyelitis, left femur: Secondary | ICD-10-CM

## 2020-04-30 LAB — PATHOLOGIST SMEAR REVIEW

## 2020-04-30 MED ORDER — SODIUM CHLORIDE 0.9 % IV SOLN
600.0000 mg | Freq: Every day | INTRAVENOUS | Status: DC
  Administered 2020-04-30: 600 mg via INTRAVENOUS
  Filled 2020-04-30 (×2): qty 12

## 2020-04-30 MED ORDER — CHLORHEXIDINE GLUCONATE CLOTH 2 % EX PADS
6.0000 | MEDICATED_PAD | Freq: Every day | CUTANEOUS | Status: DC
  Administered 2020-05-01: 6 via TOPICAL

## 2020-04-30 MED ORDER — PIRFENIDONE 801 MG PO TABS
801.0000 mg | ORAL_TABLET | Freq: Three times a day (TID) | ORAL | Status: DC
  Administered 2020-04-30 – 2020-05-01 (×5): 801 mg via ORAL
  Filled 2020-04-30 (×5): qty 1

## 2020-04-30 MED ORDER — SODIUM CHLORIDE 0.9 % IV SOLN
2.0000 g | Freq: Three times a day (TID) | INTRAVENOUS | Status: DC
  Administered 2020-04-30 – 2020-05-01 (×4): 2 g via INTRAVENOUS
  Filled 2020-04-30 (×4): qty 2

## 2020-04-30 MED ORDER — SODIUM CHLORIDE 0.9% FLUSH: 10.0000 mL | INTRAVENOUS | Status: DC | PRN

## 2020-04-30 NOTE — Progress Notes (Signed)
Physical Therapy Treatment Patient Details Name: [name] MRN: [id number] DOB: [birth year] Today's Date: 04/30/2020    History of Present Illness The pt is a 69 yo male presenting after I&D of L hip prothesis on 12/17. PMH includes: multiple L hip replacements and recent infection in June (s/p 3 months antibiotics), HIV on HAART, CLL, ILD with chronic hypoxic respite failure on 2 L O2 via nasal cannula, rheumatoid arthritis, and history of DVT on prophylaxis dose of edoxaban.    PT Comments    Pt supine on arrival, c/o fatigue but agreeable to bed-level session with encouragement. Pt performed supine A/AAROM therapeutic exercises with good tolerance, needing increased assist on LLE 2/2 pain/fatigue. HEP handout given (link: [email or web address] Access Code: [email or web address]). Pt making slow progress toward mobility goals, will plan to assess standing transfers and gait progression next session as tolerated. D/C recs below, pending progress.  Follow Up Recommendations  Home health PT;Supervision - Intermittent     Equipment Recommendations  None recommended by PT    Recommendations for Other Services       Precautions / Restrictions Precautions Precautions: Fall Precaution Comments: watch O2 Restrictions Weight Bearing Restrictions: No    Mobility  Bed Mobility Overal bed mobility: Modified Independent             General bed mobility comments: pt performed seated posterior scoot in bed (long sit) modI with increased time but defers EOB/OOB mobility  Transfers                    Ambulation/Gait                 Stairs             Wheelchair Mobility    Modified Rankin (Stroke Patients Only)       Balance                                            Cognition Arousal/Alertness: Awake/alert Behavior During Therapy: WFL for tasks assessed/performed Overall Cognitive Status: Within Functional Limits for tasks assessed                                         Exercises General Exercises - Upper Extremity Shoulder Flexion: AROM (attempted x1 rep however pt reports increased B shld pain, deferred for now) Elbow Flexion: AROM;Strengthening;Both;15 reps;Supine Wrist Flexion: AROM;Both;10 reps;Supine Wrist Extension: AROM;Both;10 reps;Supine General Exercises - Lower Extremity Ankle Circles/Pumps: AROM;Strengthening;Both;10 reps;Supine Heel Slides: AROM;AAROM;Both;10 reps;Supine (AA at times for LLE) Hip ABduction/ADduction: AAROM;Both;10 reps;Supine (increased AA on LLE, sometimes AROM on RLE) Straight Leg Raises: AAROM;Both;10 reps;Supine Hip Flexion/Marching: AAROM;Both;10 reps;Supine (HOB as flat as tolerated)    General Comments General comments (skin integrity, edema, etc.): VSS at rest on RA (93-95%), HR 73 bpm during supine exercises; pt pulling 1500-1750 on IS x5 reps, encouraged hourly use      Pertinent Vitals/Pain Pain Assessment: 0-10 Pain Score: 5  Pain Location: mainly L hip, some "other joints" esp B shoulders with flexion Pain Descriptors / Indicators: Aching;Discomfort Pain Intervention(s): Monitored during session;Repositioned (pt defers ice)  See comments above  Home Living                      Prior Function  PT Goals (current goals can now be found in the care plan section) Acute Rehab PT Goals Patient Stated Goal: return home, start working out in pool PT Goal Formulation: With patient Time For Goal Achievement: 05/12/20 Potential to Achieve Goals: Fair Progress towards PT goals: Progressing toward goals    Frequency    Min 3X/week      PT Plan Current plan remains appropriate    Co-evaluation              AM-PAC PT "6 Clicks" Mobility   Outcome Measure  Help needed turning from your back to your side while in a flat bed without using bedrails?: None Help needed moving from lying on your back to sitting on the side of a  flat bed without using bedrails?: A Little Help needed moving to and from a bed to a chair (including a wheelchair)?: A Little Help needed standing up from a chair using your arms (e.g., wheelchair or bedside chair)?: A Little Help needed to walk in hospital room?: A Little Help needed climbing 3-5 steps with a railing? : A Lot 6 Click Score: 18    End of Session   Activity Tolerance: Patient limited by fatigue (deferred EOB/OOB 2/2 fatigue) Patient left: in bed;with call bell/phone within reach;with bed alarm set Nurse Communication: Mobility status PT Visit Diagnosis: Other abnormalities of gait and mobility (R26.89);Muscle weakness (generalized) (M62.81)     Time: 9977-4142 PT Time Calculation (min) (ACUTE ONLY): 21 min  Charges:  $Therapeutic Exercise: 8-22 mins                     Ragnar Waas P., PTA Acute Rehabilitation Services Pager: [phone] Office: [place] 04/30/2020, 5:16 PM

## 2020-04-30 NOTE — TOC Initial Note (Signed)
Transition of Care [facility]) - Initial/Assessment Note    Patient Details  Name: [name] MRN: [id number] Date of Birth: 11-15-1950  Transition of Care [facility]) CM/SW Contact:    Marilu Favre, RN Phone Number: 04/30/2020, 11:17 AM  Clinical Narrative:                 Patient from home alone. Has DME. Patient has done IV ABX at home in past. He used [facility] ( [place]) and [facility] and wishes to use them again.   Pam with [facility] following. Cheryl with [facility] following. Will need HHRN and HHPT orders and face to face, and OP script  once home antibiotic determined.   Expected Discharge Plan: [place]     Patient Goals and CMS Choice Patient states their goals for this hospitalization and ongoing recovery are:: to return to home CMS Medicare.gov Compare Post Acute Care list provided to:: Patient Choice offered to / list presented to : Patient  Expected Discharge Plan and Services Expected Discharge Plan: [place]   Discharge Planning Services: CM Consult Post Acute Care Choice: [place] arrangements for the past 2 months: Single Family Home                 DME Arranged: N/A         HH Arranged: RN,PT [place] Agency: [place] Date [place]: 04/30/20 Time HH Agency Contacted: 39 Representative spoke with at [place]: [place] Arrangements/Services Living arrangements for the past 2 months: [place] with:: Self Patient language and need for interpreter reviewed:: Yes Do you feel safe going back to the place where you live?: Yes      Need for Family Participation in Patient Care: Yes (Comment) Care giver support system in place?: Yes (comment) Current home services: DME Criminal Activity/Legal Involvement Pertinent to Current Situation/Hospitalization: No - Comment as needed  Activities of Daily Living Home Assistive Devices/Equipment:  Walker (specify type) (motorized chair) ADL Screening (condition at time of admission) Patient's cognitive ability adequate to safely complete daily activities?: Yes Is the patient deaf or have difficulty hearing?: Yes Does the patient have difficulty seeing, even when wearing glasses/contacts?: No Does the patient have difficulty concentrating, remembering, or making decisions?: No Patient able to express need for assistance with ADLs?: Yes Does the patient have difficulty dressing or bathing?: Yes Independently performs ADLs?: Yes (appropriate for developmental age) Does the patient have difficulty walking or climbing stairs?: Yes Weakness of Legs: Left Weakness of Arms/Hands: None  Permission Sought/Granted   Permission granted to share information with : Yes, Verbal Permission Granted     Permission granted to share info w AGENCY: [facility], [facility]        Emotional Assessment Appearance:: Appears stated age Attitude/Demeanor/Rapport: Engaged Affect (typically observed): Accepting Orientation: : Oriented to Self,Oriented to Place,Oriented to  Time,Oriented to Situation Alcohol / Substance Use: Not Applicable Psych Involvement: No (comment)  Admission diagnosis:  Osteomyelitis [facility]) [M86.9] Patient Active Problem List   Diagnosis Date Noted  . Pressure injury of skin 04/27/2020  . Prosthetic hip infection ([place]) 04/27/2020  . Osteomyelitis ([place]) 04/26/2020  . Tinea corporis 04/11/2020  . Allergic contact dermatitis due to drugs in contact with skin 04/11/2020  . Thiamine deficiency 03/26/2020  . Pelvic mass in male 03/21/2020  . Skin mass 02/15/2020  . Chronic respiratory failure with hypoxia ([place]) 12/22/2019  . Diastolic dysfunction with  acute on chronic heart failure ([place]) 12/10/2019  . Ecchymoses, spontaneous 11/29/2019  . Sleep apnea in adult 11/29/2019  . Diuretic-induced hypokalemia 11/24/2019  . Hypokalemia due to inadequate potassium intake 11/22/2019   . Prosthetic joint infection of left hip ([place]) 11/08/2019  . Aortic atherosclerosis ([place]) 10/20/2019  . Neuropathy due to HIV ([place]) 08/17/2019  . Benign prostatic hyperplasia 08/04/2019  . Chronic bronchitis, mucopurulent ([place]) 08/01/2019  . Drug-induced polyneuropathy ([place]) 06/14/2019  . Personal history of TIA (transient ischemic attack) 04/13/2019  . HIV-1 associated autonomic neuropathy ([place]) 04/13/2019  . Impaired functional mobility, balance, gait, and endurance 04/13/2019  . Cerebellar ataxia in diseases classified elsewhere ([place]) 04/13/2019  . Recurrent major depressive disorder, in partial remission ([place]) 11/23/2018  . Atherosclerosis of coronary artery bypass graft(s), unspecified, with other forms of angina pectoris ([place]) 11/23/2018  . Murmur, cardiac 11/23/2018  . Iron deficiency anemia due to dietary causes 11/17/2018  . Bilateral leg edema 11/16/2018  . Dislocation of hip prosthesis ([place])   . Avascular necrosis of bone of hip, left ([place]) 08/09/2018  . Eczema 07/01/2018  . Depression with anxiety 01/14/2018  . 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 Deficiency anemia 12/25/2016  . Carotid artery disease ([place]) 11/13/2016  . Stenosis of carotid artery 11/13/2016  . Spinal stenosis of thoracolumbar region 07/02/2016  . ILD (interstitial lung disease) ([place]) 09/13/2015  . Immunosuppressed status ([place]) 09/13/2015  . Tinea cruris 09/12/2015  . PCP (pneumocystis jiroveci pneumonia) ([place]) 06/18/2015  . Postinflammatory pulmonary fibrosis ([place]) 05/11/2015  . GERD (gastroesophageal reflux disease)   . Hereditary and idiopathic peripheral neuropathy 09/08/2013  . Erosive esophagitis 12/28/2012  . Allergic rhinitis, cause unspecified 04/28/2012  . Long term current use of anticoagulant therapy 02/03/2012  . Hypertension   . Gout   . Carotid artery occlusion   . Hyperlipidemia with target LDL less than 100   . HIV infection ([place]) 04/08/2011  . Arthritis, rheumatoid ([place])  04/08/2011  . CLL (chronic lymphoid leukemia) in relapse ([place]) 04/08/2011  . Type 2 diabetes mellitus with diabetic polyneuropathy, without long-term current use of insulin ([place]) 04/02/2011  . CAD (coronary artery disease) of artery bypass graft 02/14/2009  . Herpes genitalis 06/22/2008   PCP:  [name], MD Pharmacy:   [id number] [place], [place] [address] [place] [address] Phone: [phone] Fax: [phone]  [place], [place] [address] Phone: [phone] Fax: [phone]     Social Determinants of Health (SDOH) Interventions    Readmission Risk Interventions Readmission Risk Prevention Plan 09/16/2018  Transportation Screening Complete  Medication Review (RN Care Manager) Complete  PCP or Specialist appointment within 3-5 days of discharge Complete  HRI or [place] Not Complete  HRI or Home Care Consult Pt Refusal Comments plan for pt to dc to SNF  SW Recovery Care/Counseling Consult Complete  Palliative Care Screening Not Applicable  Skilled Nursing Facility Complete  Some recent data might be hidden

## 2020-04-30 NOTE — Progress Notes (Signed)
Peripherally Inserted Central Catheter Placement  The IV Nurse has discussed with the patient and/or persons authorized to consent for the patient, the purpose of this procedure and the potential benefits and risks involved with this procedure.  The benefits include less needle sticks, lab draws from the catheter, and the patient may be discharged home with the catheter. Risks include, but not limited to, infection, bleeding, blood clot (thrombus formation), and puncture of an artery; nerve damage and irregular heartbeat and possibility to perform a PICC exchange if needed/ordered by physician.  Alternatives to this procedure were also discussed.  Bard Power PICC patient education guide, fact sheet on infection prevention and patient information card has been provided to patient /or left at bedside.    PICC Placement Documentation  PICC Single Lumen 04/30/20 PICC Right Brachial 45 cm 0 cm (Active)  Indication for Insertion or Continuance of Line Home intravenous therapies (PICC only);Prolonged intravenous therapies 04/30/20 2240  Exposed Catheter (cm) 0 cm 04/30/20 2240  Site Assessment Clean;Dry;Intact 04/30/20 2240  Line Status Flushed;Saline locked;Blood return noted 04/30/20 2240  Dressing Type Transparent 04/30/20 2240  Dressing Status Clean;Dry;Intact 04/30/20 2240  Antimicrobial disc in place? Yes 04/30/20 2240  Safety Lock Not Applicable 74/25/95 [id number]  Line Care Connections checked and tightened 04/30/20 2240  Line Adjustment (NICU/IV Team Only) No 04/30/20 2240  Dressing Intervention New dressing 04/30/20 2240  Dressing Change Due 05/07/20 04/30/20 [place], Nicolette Bang 04/30/2020, 10:42 PM

## 2020-04-30 NOTE — Progress Notes (Signed)
[facility] Infectious Diseases Follow Up Note  Patient Identification: Patient Name: [name] MRN: [id number] [place] Date: 04/26/2020  5:08 PM Age: 69 y.o.Today's Date: 04/30/2020   Reason for Visit: Left Hip PJI   Principal Problem:   Prosthetic hip infection ([place]) Active Problems:   Osteomyelitis ([place])   Pressure injury of skin   Antibiotics: Ampicillin Day 3, Daptomycin Day 1                    Total days of antibiotics day 5    Assessment Chronic Left Hip PJI  -history of prior Enterococcus faecalis prosthetic hip infection status post IV antibiotics earlier this year followed by oral suppressive amoxicillin that he has been off of since September/October.  -s/p OR on 12/17 with I and D with findings " very large fluid collection consistent with infection". OR cultures initially growing E faecalis and was on Ampcillin. E cloacae was isolated today   RSV Tested positive at [facility] on 12/13. Isolation precautions per IP  HIV Well-controlled on Biktarvy and Prezcobix Last CD4 count is 1255( 12%) and HIV VL is undetectable in 07/2019  Follows up with Dr [name] at [facility]   Recommendations Will DC Ampicillin and start Daptomycin and cefepime with new growth of E cloacae in addition to E faecalis. Would avoid Zosyn given E cloacae is an Amp C producing organism Will need a PICC line for Home IV abx. Blood cx 12/17 are NG in 3 days.  Duration of antibiotics would be 6 weeks from date of OR 04/27/20. This will need to be followed by PO suppression given recurrent PJI for a longer duration Baseline CPK Monitor CBC, BMP and CK while on IV abx  Follow up with [facility] will be made upon discharge  Rest of the management as per the primary team. Thank you for the consult. Please page with pertinent questions or concerns.  ______________________________________________________________________ Subjective patient seen  and examined at the bedside. Denies any complaints today. He is overall doing well. He says he has done iv abx with HH before.   Vitals BP 113/74 (BP Location: Right Arm)   Pulse 60   Temp 97.9 F (36.6 C) (Oral)   Resp 16   Wt 72.1 kg   SpO2 91%   BMI 24.90 kg/m     Physical Exam Constitutional:  Not in acute distress, sitting in the chair     Comments:   Cardiovascular:     Rate and Rhythm: Normal rate and regular rhythm.     Heart sounds: No murmur heard.   Pulmonary:     Effort: Pulmonary effort is normal. Basilar coarse sounds    Comments:   Abdominal:     Palpations: Abdomen is soft.     Tenderness: Non tender   Musculoskeletal:        General: Left hip has a bandage in place, is minimally swollen but no warmth and non tender, Ulnar deviation of bilateral hands   Skin:    Comments: No obvious lesions or rashes   Neurological:     General: No focal deficit present.   Psychiatric:        Mood and Affect: Mood normal.   Pertinent Microbiology Results for orders placed or performed during the hospital encounter of 04/26/20  Culture, blood (routine x 2)     Status: None (Preliminary result)   Collection Time: 04/27/20  8:48 AM   Specimen: BLOOD  Result Value Ref Range Status  Specimen Description BLOOD BLOOD RIGHT HAND  Final   Special Requests   Final    BOTTLES DRAWN AEROBIC AND ANAEROBIC Blood Culture adequate volume   Culture   Final    NO GROWTH 3 DAYS Performed at [facility], [address] [place], [place] [address]    Report Status PENDING  Incomplete  Culture, blood (routine x 2)     Status: None (Preliminary result)   Collection Time: 04/27/20  8:48 AM   Specimen: BLOOD  Result Value Ref Range Status   Specimen Description BLOOD RIGHT ANTECUBITAL  Final   Special Requests   Final    BOTTLES DRAWN AEROBIC AND ANAEROBIC Blood Culture results may not be optimal due to an excessive volume of blood received in culture bottles   Culture    Final    NO GROWTH 3 DAYS Performed at [facility], [place] [address], [place], [place] [address]    Report Status PENDING  Incomplete  Aerobic/Anaerobic Culture (surgical/deep wound)     Status: None (Preliminary result)   Collection Time: 04/27/20  6:03 PM   Specimen: PATH Cytology Misc. fluid; Body Fluid  Result Value Ref Range Status   Specimen Description FLUID LEFT HIP  Final   Special Requests SWAB  Final   Gram Stain   Final    MODERATE WBC PRESENT, PREDOMINANTLY PMN FEW GRAM POSITIVE COCCI IN CLUSTERS Performed at [facility], [address], [place], [place] [address]    Culture   Final    FEW ENTEROCOCCUS FAECALIS FEW ENTEROBACTER CLOACAE    Report Status PENDING  Incomplete   Organism ID, Bacteria ENTEROCOCCUS FAECALIS  Final   Organism ID, Bacteria ENTEROBACTER CLOACAE  Final      Susceptibility   Enterobacter cloacae - MIC*    CEFAZOLIN >=64 RESISTANT Resistant     CEFEPIME 0.25 SENSITIVE Sensitive     CEFTAZIDIME <=1 SENSITIVE Sensitive     CIPROFLOXACIN <=0.25 SENSITIVE Sensitive     GENTAMICIN <=1 SENSITIVE Sensitive     IMIPENEM 0.5 SENSITIVE Sensitive     TRIMETH/SULFA >=320 RESISTANT Resistant     PIP/TAZO 16 SENSITIVE Sensitive     * FEW ENTEROBACTER CLOACAE   Enterococcus faecalis - MIC*    AMPICILLIN <=2 SENSITIVE Sensitive     VANCOMYCIN 1 SENSITIVE Sensitive     GENTAMICIN SYNERGY RESISTANT Resistant     * FEW ENTEROCOCCUS FAECALIS   *Note: Due to a large number of results and/or encounters for the requested time period, some results have not been displayed. A complete set of results can be found in Results Review.   Pertinent Lab. CBC Latest Ref Rng & Units 04/29/2020 04/28/2020 04/27/2020  WBC 4.0 - 10.5 K/uL 15.4(H) 16.7(H) 12.5(H)  Hemoglobin 13.0 - 17.0 g/dL 9.3(L) 10.6(L) 10.2(L)  Hematocrit 39.0 - 52.0 % 28.5(L) 34.0(L) 33.1(L)  Platelets 150 - 400 K/uL 109(L) 99(L) 103(L)   CMP Latest Ref Rng & Units 04/29/2020 04/28/2020  04/27/2020  Glucose 70 - 99 mg/dL 133(H) 170(H) 101(H)  BUN 8 - 23 mg/dL 18 16 11   Creatinine 0.61 - 1.24 mg/dL 0.73 0.64 0.77  Sodium 135 - 145 mmol/L 138 136 136  Potassium 3.5 - 5.1 mmol/L 3.6 3.8 2.6(LL)  Chloride 98 - 111 mmol/L 98 99 98  CO2 22 - 32 mmol/L 31 31 31   Calcium 8.9 - 10.3 mg/dL 7.9(L) 7.9(L) 7.2(L)  Total Protein 6.5 - 8.1 g/dL - - -  Total Bilirubin 0.3 -  1.2 mg/dL - - -  Alkaline Phos 38 - 126 U/L - - -  AST 15 - 41 U/L - - -  ALT 0 - 44 U/L - - -    Pertinent Imaging today Plain films and CT images have been personally visualized and interpreted; radiology reports have been reviewed. Decision making incorporated into the Impression / Recommendations.  I have spent approx 30 minutes for this patient encounter including review of prior medical records with greater than 50% of time being face to face and coordination of their care.  Electronically signed by:   Rosiland Oz, MD Infectious Disease Physician [facility] Pager: [phone]

## 2020-04-30 NOTE — Progress Notes (Signed)
PHARMACY CONSULT NOTE FOR:  OUTPATIENT  PARENTERAL ANTIBIOTIC THERAPY (OPAT)  Indication: Daptomycin 600 mg every 24 hours + Cefepime 2 gm IV Q 8 hours  Regimen: L hip PJI  End date: 06/08/20  IV antibiotic discharge orders are pended. To discharging provider:  please sign these orders via discharge navigator,  Select New Orders & click on the button choice - Manage This Unsigned Work.     Thank you for allowing pharmacy to be a part of this patient's care.  Jimmy Footman, PharmD, BCPS, BCIDP Infectious Diseases Clinical Pharmacist Phone: [phone] 04/30/2020, 2:44 PM

## 2020-04-30 NOTE — Progress Notes (Signed)
PROGRESS NOTE    [name]  HKV:[id number] DOB: September 03, 1950 DOA: 04/26/2020 PCP: [name], MD    Brief Narrative:  69 year old gentleman with history of avascular necrosis of the left hip status post prior hip arthroplasty and prosthetic hip infections, HIV on antiretroviral, interstitial lung disease on 2 L oxygen and rheumatoid arthritis brought to the hospital from [facility] emergency department due to concern about left hip infection.  Total hip arthroplasty in 08/2018, recurrent dislocations, periprosthetic fracture on 02/2019, periprosthetic infection and 10/2019 with Enterococcus faecalis.  Was treated with 6 weeks of IV antibiotics and oral amoxicillin suppression therapy.  Presented back to the hospital with ongoing progressive pain on the left hip.  CT scan was consistent with periprosthetic fluid collection. 12/16, admitted to [facility] 12/17, underwent left hip washout. Blood cultures and surgical cultures negative so far. 12/18, started on ampicillin.   Assessment & Plan:   Principal Problem:   Prosthetic hip infection ([place]) Active Problems:   Osteomyelitis ([place])   Pressure injury of skin  Infection of the prosthetic hip: Currently hemodynamically stable.  12/13, blood cultures from [facility] pending. 12/17 blood cultures negative so far.  Surgical cultures with Enterococcus faecalis and Enterococcus [place]ae Was on ampicillin. As per ID plan, daptomycin and cefepime.  PICC line today. Anticipate discharge home tomorrow with PICC line and 6 weeks of IV antibiotics.  RSV: Tested in outside ER.  Droplet precautions. Clinically improving.  HIV: Well controlled.  Last CD4 count 1200.  On Biktarvy and Prezcobix.  Electrolyte abnormalities: Replaced and adequate..  Chronic hypoxic respiratory failure due to interstitial lung disease: On 2 L of oxygen and stable. Patient is on chronic maintenance prednisone therapy that he will  continue.   DVT prophylaxis: enoxaparin (LOVENOX) injection 40 mg Start: 04/27/20 1000   Code Status: Full code Family Communication: None Disposition Plan: Status is: Inpatient  Remains inpatient appropriate because:Inpatient level of care appropriate due to severity of illness   Dispo: The patient is from: Home              Anticipated d/c is to: Home with IV antibiotics.              Anticipated d/c date is: Architectural technologist.              Patient currently is not medically stable to d/c.  PICC line and home antibiotic arrangement plans pending.    Consultants:   Orthopedics  ID  Procedures:   None  Antimicrobials:  Antibiotics Given (last 72 hours)    Date/Time Action Medication Dose Rate   04/27/20 1812 Given  [left hip]   vancomycin (VANCOCIN) powder 1,000 mg    04/28/20 0840 Given   bictegravir-emtricitabine-tenofovir AF (BIKTARVY) 50-200-25 MG per tablet 1 tablet 1 tablet    04/28/20 0843 Given   darunavir-cobicistat (PREZCOBIX) 800-150 MG per tablet 1 tablet 1 tablet    04/28/20 0843 Given   valACYclovir (VALTREX) tablet 500 mg 500 mg    04/28/20 1458 New Bag/Given   ampicillin (OMNIPEN) 2 g in sodium chloride 0.9 % 100 mL IVPB 2 g 300 mL/hr   04/28/20 1800 New Bag/Given   ampicillin (OMNIPEN) 2 g in sodium chloride 0.9 % 100 mL IVPB 2 g 300 mL/hr   04/28/20 2046 New Bag/Given   ampicillin (OMNIPEN) 2 g in sodium chloride 0.9 % 100 mL IVPB 2 g 300 mL/hr   04/28/20 2340 New Bag/Given   ampicillin (OMNIPEN) 2 g in sodium  chloride 0.9 % 100 mL IVPB 2 g 300 mL/hr   04/29/20 0455 New Bag/Given   ampicillin (OMNIPEN) 2 g in sodium chloride 0.9 % 100 mL IVPB 2 g 300 mL/hr   04/29/20 0903 New Bag/Given   ampicillin (OMNIPEN) 2 g in sodium chloride 0.9 % 100 mL IVPB 2 g 300 mL/hr   04/29/20 8295 Given   valACYclovir (VALTREX) tablet 500 mg 500 mg    04/29/20 6213 Given   bictegravir-emtricitabine-tenofovir AF (BIKTARVY) 50-200-25 MG per tablet 1 tablet 1 tablet     04/29/20 0907 Given   darunavir-cobicistat (PREZCOBIX) 800-150 MG per tablet 1 tablet 1 tablet    04/29/20 1106 New Bag/Given   ampicillin (OMNIPEN) 2 g in sodium chloride 0.9 % 100 mL IVPB 2 g 300 mL/hr   04/29/20 1555 New Bag/Given   ampicillin (OMNIPEN) 2 g in sodium chloride 0.9 % 100 mL IVPB 2 g 300 mL/hr   04/29/20 2056 New Bag/Given   ampicillin (OMNIPEN) 2 g in sodium chloride 0.9 % 100 mL IVPB 2 g 300 mL/hr   04/30/20 0049 New Bag/Given   ampicillin (OMNIPEN) 2 g in sodium chloride 0.9 % 100 mL IVPB 2 g 300 mL/hr   04/30/20 0447 New Bag/Given   ampicillin (OMNIPEN) 2 g in sodium chloride 0.9 % 100 mL IVPB 2 g 300 mL/hr   04/30/20 0955 New Bag/Given   ampicillin (OMNIPEN) 2 g in sodium chloride 0.9 % 100 mL IVPB 2 g 300 mL/hr   04/30/20 0957 Given   darunavir-cobicistat (PREZCOBIX) 800-150 MG per tablet 1 tablet 1 tablet    04/30/20 0958 Given   bictegravir-emtricitabine-tenofovir AF (BIKTARVY) 50-200-25 MG per tablet 1 tablet 1 tablet    04/30/20 0865 Given   valACYclovir (VALTREX) tablet 500 mg 500 mg    04/30/20 1324 New Bag/Given   DAPTOmycin (CUBICIN) 600 mg in sodium chloride 0.9 % IVPB 600 mg 224 mL/hr         Subjective: Patient seen and examined.  No overnight events.  He is eager to get out of the hospital.  He is familiar with home antibiotic plan.  Breathing better.  Less congestion now.  Objective: Vitals:   04/29/20 0452 04/29/20 1321 04/29/20 2038 04/30/20 0451  BP: 118/73 (!) 104/56 119/70 113/74  Pulse: 60 67 (!) 59 60  Resp: 18 18 16 16   Temp: 97.9 F (36.6 C) 98.2 F (36.8 C) 98.1 F (36.7 C) 97.9 F (36.6 C)  TempSrc: Oral Oral Oral Oral  SpO2: 94% 94% 93% 91%  Weight:        Intake/Output Summary (Last 24 hours) at 04/30/2020 1414 Last data filed at 04/30/2020 1313 Gross per 24 hour  Intake --  Output 900 ml  Net -900 ml   Filed Weights   04/26/20 2000  Weight: 72.1 kg    Examination:  General exam: Appears calm and comfortable   Looks comfortable on room air today. Respiratory system: Clear to auscultation. Respiratory effort normal.  No added sounds. Cardiovascular system: S1 & S2 heard, RRR. No JVD, murmurs, rubs, gallops or clicks. No pedal edema. Gastrointestinal system: Abdomen is nondistended, soft and nontender. No organomegaly or masses felt. Normal bowel sounds heard. Central nervous system: Alert and oriented. No focal neurological deficits. Extremities: Symmetric 5 x 5 power. Surgical dressing present on left lateral thigh, no swelling or erythema. Skin: No rashes, lesions or ulcers Psychiatry: Judgement and insight appear normal. Mood & affect appropriate.     Data Reviewed: I have  personally reviewed following labs and imaging studies  CBC: Recent Labs  Lab 04/27/20 0018 04/28/20 0426 04/29/20 0149  WBC 12.5* 16.7* 15.4*  NEUTROABS  --  2.7 2.9  HGB 10.2* 10.6* 9.3*  HCT 33.1* 34.0* 28.5*  MCV 108.2* 107.9* 106.3*  PLT 103* 99* 664*   Basic Metabolic Panel: Recent Labs  Lab 04/27/20 0018 04/28/20 0426 04/29/20 0149  NA 136 136 138  K 2.6* 3.8 3.6  CL 98 99 98  CO2 31 31 31   GLUCOSE 101* 170* 133*  BUN 11 16 18   CREATININE 0.77 0.64 0.73  CALCIUM 7.2* 7.9* 7.9*  MG  --  1.8  --   PHOS  --  2.9  --    GFR: Estimated Creatinine Clearance: 79.2 mL/min (by C-G formula based on SCr of 0.73 mg/dL). Liver Function Tests: No results for input(s): AST, ALT, ALKPHOS, BILITOT, PROT, ALBUMIN in the last 168 hours. No results for input(s): LIPASE, AMYLASE in the last 168 hours. No results for input(s): AMMONIA in the last 168 hours. Coagulation Profile: No results for input(s): INR, PROTIME in the last 168 hours. Cardiac Enzymes: No results for input(s): CKTOTAL, CKMB, CKMBINDEX, TROPONINI in the last 168 hours. BNP (last 3 results) No results for input(s): PROBNP in the last 8760 hours. HbA1C: No results for input(s): HGBA1C in the last 72 hours. CBG: No results for input(s):  GLUCAP in the last 168 hours. Lipid Profile: No results for input(s): CHOL, HDL, LDLCALC, TRIG, CHOLHDL, LDLDIRECT in the last 72 hours. Thyroid Function Tests: No results for input(s): TSH, T4TOTAL, FREET4, T3FREE, THYROIDAB in the last 72 hours. Anemia Panel: No results for input(s): VITAMINB12, FOLATE, FERRITIN, TIBC, IRON, RETICCTPCT in the last 72 hours. Sepsis Labs: No results for input(s): PROCALCITON, LATICACIDVEN in the last 168 hours.  Recent Results (from the past 240 hour(s))  Culture, blood (routine x 2)     Status: None (Preliminary result)   Collection Time: 04/27/20  8:48 AM   Specimen: BLOOD  Result Value Ref Range Status   Specimen Description BLOOD BLOOD RIGHT HAND  Final   Special Requests   Final    BOTTLES DRAWN AEROBIC AND ANAEROBIC Blood Culture adequate volume   Culture   Final    NO GROWTH 3 DAYS Performed at [facility], [address] [place], [place] [address]    Report Status PENDING  Incomplete  Culture, blood (routine x 2)     Status: None (Preliminary result)   Collection Time: 04/27/20  8:48 AM   Specimen: BLOOD  Result Value Ref Range Status   Specimen Description BLOOD RIGHT ANTECUBITAL  Final   Special Requests   Final    BOTTLES DRAWN AEROBIC AND ANAEROBIC Blood Culture results may not be optimal due to an excessive volume of blood received in culture bottles   Culture   Final    NO GROWTH 3 DAYS Performed at [facility], [place] [address], [place], [place] [address]    Report Status PENDING  Incomplete  Aerobic/Anaerobic Culture (surgical/deep wound)     Status: None (Preliminary result)   Collection Time: 04/27/20  6:03 PM   Specimen: PATH Cytology Misc. fluid; Body Fluid  Result Value Ref Range Status   Specimen Description FLUID LEFT HIP  Final   Special Requests SWAB  Final   Gram Stain   Final    MODERATE WBC PRESENT, PREDOMINANTLY PMN FEW GRAM POSITIVE COCCI IN CLUSTERS Performed at [facility], [address] [place],  [address]    Culture   Final    FEW ENTEROCOCCUS FAECALIS FEW ENTEROBACTER CLOACAE    Report Status PENDING  Incomplete   Organism ID, Bacteria ENTEROCOCCUS FAECALIS  Final   Organism ID, Bacteria ENTEROBACTER CLOACAE  Final      Susceptibility   Enterobacter cloacae - MIC*    CEFAZOLIN >=64 RESISTANT Resistant     CEFEPIME 0.25 SENSITIVE Sensitive     CEFTAZIDIME <=1 SENSITIVE Sensitive     CIPROFLOXACIN <=0.25 SENSITIVE Sensitive     GENTAMICIN <=1 SENSITIVE Sensitive     IMIPENEM 0.5 SENSITIVE Sensitive     TRIMETH/SULFA >=320 RESISTANT Resistant     PIP/TAZO 16 SENSITIVE Sensitive     * FEW ENTEROBACTER CLOACAE   Enterococcus faecalis - MIC*    AMPICILLIN <=2 SENSITIVE Sensitive     VANCOMYCIN 1 SENSITIVE Sensitive     GENTAMICIN SYNERGY RESISTANT Resistant     * FEW ENTEROCOCCUS FAECALIS         Radiology Studies: [place] EKG SITE RITE  Result Date: 04/30/2020 If Site Rite image not attached, placement could not be confirmed due to current cardiac rhythm.       Scheduled Meds: . atorvastatin  10 mg Oral QHS  . bictegravir-emtricitabine-tenofovir AF  1 tablet Oral Daily  . cycloSPORINE  1 drop Both Eyes BID  . darunavir-cobicistat  1 tablet Oral Q breakfast  . enoxaparin (LOVENOX) injection  40 mg Subcutaneous Q24H  . escitalopram  20 mg Oral Daily  . ezetimibe  10 mg Oral QHS  . fluocinonide cream  1 application Topical BID  . folic acid  1 mg Oral Daily  . l-methylfolate-B6-B12  1 tablet Oral BID  . leflunomide  20 mg Oral Daily  . magnesium oxide  400 mg Oral BID  . metoprolol succinate  50 mg Oral Daily  . Pirfenidone  801 mg Oral TID  . predniSONE  5 mg Oral Q breakfast  . pregabalin  200 mg Oral BID  . sulfaSALAzine  500 mg Oral BID  . tamsulosin  0.4 mg Oral QPC supper  . thiamine  100 mg Oral QODAY  . torsemide  20 mg Oral Daily  . valACYclovir  500 mg Oral Daily   Continuous Infusions: . ceFEPime (MAXIPIME) IV    .  DAPTOmycin (CUBICIN)  IV 600 mg (04/30/20 1324)     LOS: 4 days    Time spent: 30 minutes    Barb Merino, MD [facility] Pager [phone]

## 2020-04-30 NOTE — Progress Notes (Signed)
Patient ID: [name], male   DOB: [birth year], 69 y.o.   MRN: [id number] He reports feeling better overall.  I changed his left hip dressing at the bedside.  Will need long-term antibiotic suppression.

## 2020-05-01 LAB — CK: Total CK: 60 U/L (ref 49–397)

## 2020-05-01 MED ORDER — CEFEPIME IV (FOR PTA / DISCHARGE USE ONLY): 2.0000 g | Freq: Three times a day (TID) | INTRAVENOUS | 0 refills | Status: AC

## 2020-05-01 MED ORDER — DAPTOMYCIN IV (FOR PTA / DISCHARGE USE ONLY): 600.0000 mg | INTRAVENOUS | 0 refills | Status: AC

## 2020-05-01 MED ORDER — HEPARIN SOD (PORK) LOCK FLUSH 100 UNIT/ML IV SOLN
250.0000 [IU] | INTRAVENOUS | Status: AC | PRN
  Administered 2020-05-01: 250 [IU]
  Filled 2020-05-01: qty 2.5

## 2020-05-01 NOTE — Plan of Care (Signed)
  Problem: Education: Goal: Knowledge of General Education information will improve Description: Including pain rating scale, medication(s)/side effects and non-pharmacologic comfort measures Outcome: Progressing   Problem: Health Behavior/Discharge Planning: Goal: Ability to manage health-related needs will improve Outcome: Progressing   Problem: Clinical Measurements: Goal: Ability to maintain clinical measurements within normal limits will improve Outcome: Progressing Goal: Respiratory complications will improve Outcome: Progressing   Problem: Activity: Goal: Risk for activity intolerance will decrease Outcome: Progressing   Problem: Nutrition: Goal: Adequate nutrition will be maintained Outcome: Progressing   Problem: Elimination: Goal: Will not experience complications related to bowel motility Outcome: Progressing Goal: Will not experience complications related to urinary retention Outcome: Progressing   Problem: Pain Managment: Goal: General experience of comfort will improve Outcome: Progressing   Problem: Safety: Goal: Ability to remain free from injury will improve Outcome: Progressing   Problem: Skin Integrity: Goal: Risk for impaired skin integrity will decrease Outcome: Progressing   

## 2020-05-01 NOTE — TOC Transition Note (Signed)
Transition of Care (TOC) - CM/SW Discharge Note Marvetta Gibbons RN, BSN Transitions of Care Unit 4E- RN Case Manager See Treatment Team for direct phone # Cross coverage for 6N   Patient Details  Name: Christopher BRUMETT MRN: [id number] Date of Birth: Sep 26, 1950  Transition of Care [facility]) CM/SW Contact:  Dawayne Patricia, RN Phone Number: 05/01/2020, 12:19 PM   Clinical Narrative:    Pt stable for transition home, HHRN/PT has been set up with [facility]- confirmed with Malachy Mood this AM for start of care needs- also spoke with Pam from [place] for home IV abx needs- Pam will come to bedside this am for re-education needs, pt will need to get 2pm dose of IV abx here prior to discharge- Pam to touch base with bedside RN after education is complete at the bedside.  Bedside RN aware of above plan.    Final next level of care: [place] Barriers to Discharge: No Barriers Identified   Patient Goals and CMS Choice Patient states their goals for this hospitalization and ongoing recovery are:: to return to home CMS Medicare.gov Compare Post Acute Care list provided to:: Patient Choice offered to / list presented to : Patient  Discharge Placement                Home with [facility]        Discharge Plan and Services   Discharge Planning Services: CM Consult Post Acute Care Choice: Home Health          DME Arranged: N/A         HH Arranged: IV Antibiotics HH Agency: [facility] Date HH Agency Contacted: 04/30/20 Time HH Agency Contacted: 70 Representative spoke with at [place]: [place] ([place]) Interventions     Readmission Risk Interventions Readmission Risk Prevention Plan 05/01/2020 09/16/2018  Transportation Screening Complete Complete  PCP or Specialist Appt within 3-5 Days Complete -  HRI or Home Care Consult Complete -  Social Work Consult for [place] Planning/Counseling Complete -  Palliative Care Screening Not  Applicable -  Medication Review Press photographer) Complete Complete  PCP or Specialist appointment within 3-5 days of discharge - Complete  HRI or [place] - Not Complete  HRI or Home Care Consult Pt Refusal Comments - plan for pt to dc to SNF  SW Recovery Care/Counseling Consult - Complete  Palliative Care Screening - Not [place] - Complete  Some recent data might be hidden

## 2020-05-01 NOTE — Progress Notes (Signed)
AVS given and reviewed with pt. Medications discussed. All questions answered to satisfaction. Pt verbalized understanding of information given. Pt escorted off the unit with all belongings via wheelchair by staff member.  

## 2020-05-01 NOTE — Discharge Summary (Signed)
Physician Discharge Summary  [name]:[id number] DOB: [birth year] DOA: 04/26/2020  PCP: [name], MD  Admit date: 04/26/2020 Discharge date: 05/01/2020  Admitted From: Home Disposition: Home  Recommendations for Outpatient Follow-up:  1. Follow up with PCP in 1-2 weeks 2. Please obtain BMP/CBC in one week, the left is to schedule is attached with antibiotic infusion plan  Home Health: Home health RN/PT Equipment/Devices: IV infusion  Discharge Condition: Stable CODE STATUS: Full code Diet recommendation: Regular diet  Discharge summary: 69 year old gentleman with history of avascular necrosis of the left hip status post prior hip arthroplasty and prosthetic hip infections, HIV on antiretroviral, interstitial lung disease on 2 L oxygen and rheumatoid arthritis brought to the hospital from [facility] emergency department due to concern about left hip infection.  Total hip arthroplasty in 08/2018, recurrent dislocations, periprosthetic fracture on 02/2019, periprosthetic infection and 10/2019 with Enterococcus faecalis.  Was treated with 6 weeks of IV antibiotics and oral amoxicillin suppression therapy.  Presented back to the hospital with ongoing progressive pain on the left hip.  CT scan was consistent with periprosthetic fluid collection. 12/16, admitted to [facility] 12/17, underwent left hip washout. Blood cultures and surgical cultures negative so far. 12/18, started on ampicillin. Blood cultures negative.  Surgical cultures with Enterococcus faecalis and Enterococcus cloacae.  As per ID recommendation, a PICC line was placed and patient discharged home with 6 weeks of daptomycin and cefepime. Antibiotic infusion planned, repeat blood test and monitoring as per ID clinic. His other chronic medical issues including HIV, chronic hypoxia and interstitial lung disease remained stable.  He is on chronic maintenance prednisone therapy that he will continue.   Electrolytes are replaced and adequate.  Able to go home today.  Discharge Diagnoses:  Principal Problem:   Prosthetic hip infection ([place]) Active Problems:   Osteomyelitis ([place])   Pressure injury of skin    Discharge Instructions  Discharge Instructions    [facility] pharmacist to adjust dose for Vancomycin, Aminoglycosides and other anti-infective therapies as requested by physician.   Complete by: As directed    [facility] to provide Cath Flo 2mg    Complete by: As directed    Administer for PICC line occlusion and as ordered by physician for other access device issues.   Anaphylaxis Kit: Provided to treat any anaphylactic reaction to the medication being provided to the patient if First Dose or when requested by physician   Complete by: As directed    Epinephrine 1mg /ml vial / amp: Administer 0.3mg  (0.52ml) subcutaneously once for moderate to severe anaphylaxis, nurse to call physician and pharmacy when reaction occurs and call 911 if needed for immediate care   Diphenhydramine 50mg /ml IV vial: Administer 25-50mg  IV/IM PRN for first dose reaction, rash, itching, mild reaction, nurse to call physician and pharmacy when reaction occurs   Sodium Chloride 0.9% NS 576ml IV: Administer if needed for hypovolemic blood pressure drop or as ordered by physician after call to physician with anaphylactic reaction   Call MD for:  redness, tenderness, or signs of infection (pain, swelling, redness, odor or green/yellow discharge around incision site)   Complete by: As directed    Call MD for:  severe uncontrolled pain   Complete by: As directed    Call MD for:  temperature >100.4   Complete by: As directed    Change dressing on IV access line weekly and PRN   Complete by: As directed    Diet - low sodium  heart healthy   Complete by: As directed    Discharge wound care:   Complete by: As directed    Keep dressing dry and wound clean and covered   Flush IV access with  Sodium Chloride 0.9% and Heparin 10 units/ml or 100 units/ml   Complete by: As directed    Home infusion instructions - [facility]   Complete by: As directed    Instructions: Flush IV access with Sodium Chloride 0.9% and Heparin 10units/ml or 100units/ml   Change dressing on IV access line: Weekly and PRN   Instructions Cath Flo $Remove'2mg'tbQEMvy$ : Administer for PICC Line occlusion and as ordered by physician for other access device   [facility] pharmacist to adjust dose for: Vancomycin, Aminoglycosides and other anti-infective therapies as requested by physician   Increase activity slowly   Complete by: As directed    Method of administration may be changed at the discretion of home infusion pharmacist based upon assessment of the patient and/or caregiver's ability to self-administer the medication ordered   Complete by: As directed      Allergies as of 05/01/2020      Reactions   Golimumab Anaphylaxis   Simponi ARIA   Orencia [abatacept] Anaphylaxis   Other Anaphylaxis, Hives   Pecans   Peanut-containing Drug Products Anaphylaxis, Hives, Swelling   Swelling of throat   Morphine Other (See Comments)   Severe headache- Can tolerate Dilaudid, however   Oxycodone-acetaminophen Other (See Comments)   Headache 6.22.2020 patient is currently taking and tolerating   Promethazine Hcl Other (See Comments)   Makes him feel "drunk" at higher strengths      Medication List    STOP taking these medications   clindamycin 300 MG capsule Commonly known as: CLEOCIN   sulfamethoxazole-trimethoprim 800-160 MG tablet Commonly known as: BACTRIM DS     TAKE these medications   acetaminophen 500 MG tablet Commonly known as: TYLENOL Take 500-1,000 mg by mouth every 6 (six) hours as needed for mild pain or moderate pain.   atorvastatin 10 MG tablet Commonly known as: LIPITOR Take 1 tablet (10 mg total) by mouth at bedtime.   Biktarvy 50-200-25 MG Tabs tablet Generic drug:  bictegravir-emtricitabine-tenofovir AF Take 1 tablet by mouth daily.   ceFEPime  IVPB Commonly known as: MAXIPIME Inject 2 g into the vein every 8 (eight) hours. Indication:  L hip PJI First Dose: Yes Last Day of Therapy:  06/08/2020 Labs - Once weekly:  CBC/D and BMP, Labs - Every other week:  ESR and CRP Method of administration: IV Push Method of administration may be changed at the discretion of home infusion pharmacist based upon assessment of the patient and/or caregiver's ability to self-administer the medication ordered.   daptomycin  IVPB Commonly known as: CUBICIN Inject 600 mg into the vein daily. Indication: L hip PJI First Dose: Yes Last Day of Therapy:  06/08/20 Labs - Once weekly:  CBC/D, BMP, and CPK Labs - Every other week:  ESR and CRP Method of administration: IV Push Method of administration may be changed at the discretion of home infusion pharmacist based upon assessment of the patient and/or caregiver's ability to self-administer the medication ordered.   EPINEPHrine 0.3 mg/0.3 mL Soaj injection Commonly known as: EPI-PEN Inject 0.3 mg into the muscle as needed for anaphylaxis.   Esbriet 801 MG Tabs Generic drug: Pirfenidone Take 1 tablet by mouth in the morning, at noon, and at bedtime.   escitalopram 20 MG tablet Commonly known as:  LEXAPRO TAKE 1 TABLET BY MOUTH EVERY DAY   ezetimibe 10 MG tablet Commonly known as: ZETIA Take 1 tablet (10 mg total) by mouth daily. What changed: when to take this   folic acid 1 MG tablet Commonly known as: FOLVITE Take 1 mg by mouth daily.   HYDROcodone-acetaminophen 10-325 MG tablet Commonly known as: NORCO Take 1 tablet by mouth 2 (two) times daily as needed for pain.   Klor-Con M20 20 MEQ tablet Generic drug: potassium chloride SA TAKE 1 TABLET (20 MEQ TOTAL) BY MOUTH 3 (THREE) TIMES DAILY. What changed: See the new instructions.   l-methylfolate-B6-B12 3-35-2 MG Tabs tablet Commonly known as:  METANX TAKE 1 TABLET BY MOUTH 2 (TWO) TIMES DAILY.   leflunomide 20 MG tablet Commonly known as: ARAVA Take 20 mg by mouth daily.   metoprolol succinate 50 MG 24 hr tablet Commonly known as: TOPROL-XL Take 1 tablet (50 mg total) by mouth daily. Take with or immediately following a meal.   nitroGLYCERIN 0.4 MG SL tablet Commonly known as: NITROSTAT Place 1 tablet (0.4 mg total) under the tongue every 5 (five) minutes as needed for chest pain.   ondansetron 4 MG tablet Commonly known as: ZOFRAN Take 4 mg by mouth every 6 (six) hours as needed for nausea.   prednisoLONE acetate 1 % ophthalmic suspension Commonly known as: PRED FORTE Place 1 drop into both eyes in the morning and at bedtime.   predniSONE 5 MG tablet Commonly known as: DELTASONE Take 5 mg by mouth 2 (two) times daily with a meal.   pregabalin 200 MG capsule Commonly known as: LYRICA Take 1 capsule (200 mg total) by mouth 2 (two) times daily.   Prezcobix 800-150 MG tablet Generic drug: darunavir-cobicistat Take 1 tablet by mouth daily with breakfast.   Restasis 0.05 % ophthalmic emulsion Generic drug: cycloSPORINE Place 1 drop into both eyes 2 (two) times daily.   Savaysa 30 MG Tabs tablet Generic drug: edoxaban TAKE 1 TABLET (30 MG TOTAL) BY MOUTH DAILY.   sulfaSALAzine 500 MG tablet Commonly known as: AZULFIDINE Take 500 mg by mouth in the morning and at bedtime.   tamsulosin 0.4 MG Caps capsule Commonly known as: FLOMAX Take 1 capsule (0.4 mg total) by mouth daily after supper.   thiamine 100 MG tablet Take 1 tablet (100 mg total) by mouth every other day. What changed: when to take this   torsemide 20 MG tablet Commonly known as: DEMADEX TAKE 1 TABLET BY MOUTH EVERY DAY   valACYclovir 500 MG tablet Commonly known as: VALTREX TAKE 1 TABLET BY MOUTH EVERY DAY            Discharge Care Instructions  (From admission, onward)         Start     Ordered   05/01/20 0000  Change dressing  on IV access line weekly and PRN  (Home infusion instructions - [facility] )        05/01/20 1038   05/01/20 0000  Discharge wound care:       Comments: Keep dressing dry and wound clean and covered   05/01/20 1038          Follow-up Information    Mcarthur Rossetti, MD. Schedule an appointment as soon as possible for a visit in 2 week(s).   Specialty: Orthopedic Surgery Contact information: [place] [address] [phone]        [facility] Follow up.   Why: HHRN/PT-  they will contact you for home visits- (RN to assist with Home IV abx and PICC line care) Contact information: [place] [address] [phone]        [facility] Follow up.   Contact information: [phone]              Allergies  Allergen Reactions  . Golimumab Anaphylaxis    Simponi ARIA  . Orencia [Abatacept] Anaphylaxis  . Other Anaphylaxis and Hives    Pecans  . Peanut-Containing Drug Products Anaphylaxis, Hives and Swelling    Swelling of throat  . Morphine Other (See Comments)    Severe headache- Can tolerate Dilaudid, however  . Oxycodone-Acetaminophen Other (See Comments)    Headache 6.22.2020 patient is currently taking and tolerating  . Promethazine Hcl Other (See Comments)    Makes him feel "drunk" at higher strengths    Consultations:  Infectious disease  Orthopedics   Procedures/Studies: DG HIP PORT UNILAT WITH PELVIS 1V LEFT  Result Date: 04/26/2020 CLINICAL DATA:  Infection associated with internal left hip prosthesis, subsequent encounter. EXAM: DG HIP (WITH OR WITHOUT PELVIS) 1V PORT LEFT COMPARISON:  Left hip radiograph 04/09/2020 FINDINGS: Left hip arthroplasty in place. There is no convincing periprosthetic lucency. Heterotopic calcification adjacent to the greater trochanter. Pubic rami are intact. Postsurgical change in the included lumbar spine. The bones are under mineralized. IMPRESSION:  Left hip arthroplasty in place. No convincing periprosthetic lucency. Electronically Signed   By: Keith Rake M.D.   On: 04/26/2020 19:17   XR HIP UNILAT W OR W/O PELVIS 1V LEFT  Result Date: 04/09/2020 An AP pelvis and lateral left hip shows a revision total hip arthroplasty on the left side with no significant changes when compared to previous films from October of this year.  [place] EKG SITE RITE  Result Date: 04/30/2020 If Site Rite image not attached, placement could not be confirmed due to current cardiac rhythm.  (Echo, Carotid, EGD, Colonoscopy, ERCP)    Subjective: Patient seen and examined.  No overnight events.  Eager to go home.  He thinks he is back to his baseline.  Cough and congestion has improved.   Discharge Exam: Vitals:   04/30/20 2057 05/01/20 0529  BP: 129/77 120/68  Pulse: 61 68  Resp: 17 16  Temp: 98.7 F (37.1 C) 98.4 F (36.9 C)  SpO2: 90% 93%   Vitals:   04/30/20 1441 04/30/20 1727 04/30/20 2057 05/01/20 0529  BP: 107/66  129/77 120/68  Pulse: 67  61 68  Resp: $Remo'18  17 16  'tHdmc$ Temp: 98.2 F (36.8 C)  98.7 F (37.1 C) 98.4 F (36.9 C)  TempSrc: Oral  Oral Oral  SpO2: 93%  90% 93%  Weight:  72.1 kg    Height:  $Remove'5\' 7"'LdPRHok$  (1.702 m)      General: Pt is alert, awake, not in acute distress Currently on room air.  Has occasional dry cough which is chronic. Cardiovascular: RRR, S1/S2 +, no rubs, no gallops Respiratory: CTA bilaterally, no wheezing, no rhonchi Abdominal: Soft, NT, ND, bowel sounds + Extremities: no edema, no cyanosis Left hip lateral incision clean and dry, dressing intact.    The results of significant diagnostics from this hospitalization (including imaging, microbiology, ancillary and laboratory) are listed below for reference.     Microbiology: Recent Results (from the past 240 hour(s))  Culture, blood (routine x 2)     Status: None (Preliminary result)   Collection Time: 04/27/20  8:48 AM  Specimen: BLOOD  Result Value Ref  Range Status   Specimen Description BLOOD BLOOD RIGHT HAND  Final   Special Requests   Final    BOTTLES DRAWN AEROBIC AND ANAEROBIC Blood Culture adequate volume   Culture   Final    NO GROWTH 4 DAYS Performed at [facility], [address], [place], [place] [address]    Report Status PENDING  Incomplete  Culture, blood (routine x 2)     Status: None (Preliminary result)   Collection Time: 04/27/20  8:48 AM   Specimen: BLOOD  Result Value Ref Range Status   Specimen Description BLOOD RIGHT ANTECUBITAL  Final   Special Requests   Final    BOTTLES DRAWN AEROBIC AND ANAEROBIC Blood Culture results may not be optimal due to an excessive volume of blood received in culture bottles   Culture   Final    NO GROWTH 4 DAYS Performed at [facility], [place] [address], [place], [place] [address]    Report Status PENDING  Incomplete  Aerobic/Anaerobic Culture (surgical/deep wound)     Status: None (Preliminary result)   Collection Time: 04/27/20  6:03 PM   Specimen: PATH Cytology Misc. fluid; Body Fluid  Result Value Ref Range Status   Specimen Description FLUID LEFT HIP  Final   Special Requests SWAB  Final   Gram Stain   Final    MODERATE WBC PRESENT, PREDOMINANTLY PMN FEW GRAM POSITIVE COCCI IN CLUSTERS Performed at [facility], [address], [place], [place] [address]    Culture   Final    FEW ENTEROCOCCUS FAECALIS FEW ENTEROBACTER CLOACAE NO ANAEROBES ISOLATED; CULTURE IN PROGRESS FOR 5 DAYS    Report Status PENDING  Incomplete   Organism ID, Bacteria ENTEROCOCCUS FAECALIS  Final   Organism ID, Bacteria ENTEROBACTER CLOACAE  Final      Susceptibility   Enterobacter cloacae - MIC*    CEFAZOLIN >=64 RESISTANT Resistant     CEFEPIME 0.25 SENSITIVE Sensitive     CEFTAZIDIME <=1 SENSITIVE Sensitive     CIPROFLOXACIN <=0.25 SENSITIVE Sensitive     GENTAMICIN <=1 SENSITIVE Sensitive     IMIPENEM 0.5 SENSITIVE Sensitive     TRIMETH/SULFA >=320 RESISTANT Resistant      PIP/TAZO 16 SENSITIVE Sensitive     * FEW ENTEROBACTER CLOACAE   Enterococcus faecalis - MIC*    AMPICILLIN <=2 SENSITIVE Sensitive     VANCOMYCIN 1 SENSITIVE Sensitive     GENTAMICIN SYNERGY RESISTANT Resistant     * FEW ENTEROCOCCUS FAECALIS     Labs: BNP (last 3 results) No results for input(s): BNP in the last 8760 hours. Basic Metabolic Panel: Recent Labs  Lab 04/27/20 0018 04/28/20 0426 04/29/20 0149  NA 136 136 138  K 2.6* 3.8 3.6  CL 98 99 98  CO2 $Re'31 31 31  '[place]$ GLUCOSE 101* 170* 133*  BUN $Re'11 16 18  'Mxs$ CREATININE 0.77 0.64 0.73  CALCIUM 7.2* 7.9* 7.9*  MG  --  1.8  --   PHOS  --  2.9  --    Liver Function Tests: No results for input(s): AST, ALT, ALKPHOS, BILITOT, PROT, ALBUMIN in the last 168 hours. No results for input(s): LIPASE, AMYLASE in the last 168 hours. No results for input(s): AMMONIA in the last 168 hours. CBC: Recent Labs  Lab 04/27/20 0018 04/28/20 0426 04/29/20 0149  WBC 12.5* 16.7* 15.4*  NEUTROABS  --  2.7 2.9  HGB 10.2* 10.6* 9.3*  HCT 33.1*  34.0* 28.5*  MCV 108.2* 107.9* 106.3*  PLT 103* 99* 109*   Cardiac Enzymes: Recent Labs  Lab 05/01/20 0101  CKTOTAL 60   BNP: Invalid input(s): POCBNP CBG: No results for input(s): GLUCAP in the last 168 hours. D-Dimer No results for input(s): DDIMER in the last 72 hours. Hgb A1c No results for input(s): HGBA1C in the last 72 hours. Lipid Profile No results for input(s): CHOL, HDL, LDLCALC, TRIG, CHOLHDL, LDLDIRECT in the last 72 hours. Thyroid function studies No results for input(s): TSH, T4TOTAL, T3FREE, THYROIDAB in the last 72 hours.  Invalid input(s): FREET3 Anemia work up No results for input(s): VITAMINB12, FOLATE, FERRITIN, TIBC, IRON, RETICCTPCT in the last 72 hours. Urinalysis    Component Value Date/Time   COLORURINE YELLOW 11/16/2018 1526   APPEARANCEUR Clear 12/08/2019 0937   LABSPEC >=1.030 (A) 11/16/2018 1526   PHURINE 6.0 11/16/2018 1526   GLUCOSEU Negative  12/08/2019 0937   GLUCOSEU NEGATIVE 11/16/2018 1526   HGBUR NEGATIVE 11/16/2018 1526   BILIRUBINUR Negative 12/08/2019 [place] 11/16/2018 1526   PROTEINUR Negative 12/08/2019 [place] 09/26/2018 2011   UROBILINOGEN negative (A) 09/21/2019 1006   UROBILINOGEN 1.0 11/16/2018 1526   NITRITE Negative 12/08/2019 0937   NITRITE NEGATIVE 11/16/2018 1526   LEUKOCYTESUR Negative 12/08/2019 0937   LEUKOCYTESUR NEGATIVE 11/16/2018 1526   Sepsis Labs Invalid input(s): PROCALCITONIN,  WBC,  LACTICIDVEN Microbiology Recent Results (from the past 240 hour(s))  Culture, blood (routine x 2)     Status: None (Preliminary result)   Collection Time: 04/27/20  8:48 AM   Specimen: BLOOD  Result Value Ref Range Status   Specimen Description BLOOD BLOOD RIGHT HAND  Final   Special Requests   Final    BOTTLES DRAWN AEROBIC AND ANAEROBIC Blood Culture adequate volume   Culture   Final    NO GROWTH 4 DAYS Performed at [facility], [place] [address], [place], [place] [address]    Report Status PENDING  Incomplete  Culture, blood (routine x 2)     Status: None (Preliminary result)   Collection Time: 04/27/20  8:48 AM   Specimen: BLOOD  Result Value Ref Range Status   Specimen Description BLOOD RIGHT ANTECUBITAL  Final   Special Requests   Final    BOTTLES DRAWN AEROBIC AND ANAEROBIC Blood Culture results may not be optimal due to an excessive volume of blood received in culture bottles   Culture   Final    NO GROWTH 4 DAYS Performed at [facility], [place] [address], [place], [place] [address]    Report Status PENDING  Incomplete  Aerobic/Anaerobic Culture (surgical/deep wound)     Status: None (Preliminary result)   Collection Time: 04/27/20  6:03 PM   Specimen: PATH Cytology Misc. fluid; Body Fluid  Result Value Ref Range Status   Specimen Description FLUID LEFT HIP  Final   Special Requests SWAB  Final   Gram Stain   Final    MODERATE WBC PRESENT,  PREDOMINANTLY PMN FEW GRAM POSITIVE COCCI IN CLUSTERS Performed at [facility], [address] [place], [place] [address]    Culture   Final    FEW ENTEROCOCCUS FAECALIS FEW ENTEROBACTER CLOACAE NO ANAEROBES ISOLATED; CULTURE IN PROGRESS FOR 5 DAYS    Report Status PENDING  Incomplete   Organism ID, Bacteria ENTEROCOCCUS FAECALIS  Final   Organism ID, Bacteria ENTEROBACTER CLOACAE  Final      Susceptibility  Enterobacter cloacae - MIC*    CEFAZOLIN >=64 RESISTANT Resistant     CEFEPIME 0.25 SENSITIVE Sensitive     CEFTAZIDIME <=1 SENSITIVE Sensitive     CIPROFLOXACIN <=0.25 SENSITIVE Sensitive     GENTAMICIN <=1 SENSITIVE Sensitive     IMIPENEM 0.5 SENSITIVE Sensitive     TRIMETH/SULFA >=320 RESISTANT Resistant     PIP/TAZO 16 SENSITIVE Sensitive     * FEW ENTEROBACTER CLOACAE   Enterococcus faecalis - MIC*    AMPICILLIN <=2 SENSITIVE Sensitive     VANCOMYCIN 1 SENSITIVE Sensitive     GENTAMICIN SYNERGY RESISTANT Resistant     * FEW ENTEROCOCCUS FAECALIS     Time coordinating discharge:  45 minutes  SIGNED:   Barb Merino, MD  [facility] 05/01/2020, 12:26 PM

## 2020-05-01 NOTE — Care Management Important Message (Signed)
Important Message  Patient Details  Name: [name] MRN: [id number] Date of Birth: 1950/10/28   Medicare Important Message Given:  Yes     Jeremih Dearmas 05/01/2020, 3:20 PM

## 2020-05-01 NOTE — Progress Notes (Signed)
Diagnosis: Left hip PJI   Culture Result: E faecalis   Allergies  Allergen Reactions  . Golimumab Anaphylaxis    Simponi ARIA  . Orencia [Abatacept] Anaphylaxis  . Other Anaphylaxis and Hives    Pecans  . Peanut-Containing Drug Products Anaphylaxis, Hives and Swelling    Swelling of throat  . Morphine Other (See Comments)    Severe headache- Can tolerate Dilaudid, however  . Oxycodone-Acetaminophen Other (See Comments)    Headache 6.22.2020 patient is currently taking and tolerating  . Promethazine Hcl Other (See Comments)    Makes him feel "drunk" at higher strengths    OPAT Orders Discharge antibiotics to be given via PICC line Discharge antibiotics: Daptomycin and Cefepime  Per pharmacy protocol  Duration: 6 weeks   End Date: Jun 09 2019   [place] Per Protocol:  Home health RN for IV administration and teaching; PICC line care and labs.    Labs weekly while on IV antibiotics: X__ CBC with differential X_ BMP __ CMP X_ CRP X__ ESR __ Vancomycin trough X__ CK  __ Please pull PIC at completion of IV antibiotics X_ Please leave PIC in place until doctor has seen patient or been notified  Fax weekly labs to [phone]  Clinic Follow Up Appt: DR Juleen China  May 29, 2019   '@1'$ :45 pm

## 2020-05-01 NOTE — Consult Note (Signed)
   [facility] Inpatient Consult   05/01/2020  [name] 1951-01-23 [id number]  [employer]] Patient:  Medicare   Patient screened for extreme high risk score for unplanned readmission risk noted and for 3 hospitalizations within the past 6 months. Reviewed  to check for potential [place] [facility] service needs.  Review of patient's medical record reveals patient is for home with IV therapy.  Came by to speak with patient however he was on the phone and will try to reach out again to check for needs.  Came back to speak with patient and he states he feel he has everything he needs with home health.  He confirms he lives in [place], [place] and his Primary Care Provider is [name], MD this provider is listed to provide the transition of care [TOC] for post hospital follow up.   Plan:  Gave the patient a brochure and 24 hour nurse advice line magnet and explained that it's available for telephonic follow up. He states he appreciate the information.  For questions,  Natividad Brood, RN BSN [facility] Liaison  [phone] business mobile phone Toll free office [phone]  Fax number: [phone] [email or web address]

## 2020-05-02 LAB — AEROBIC/ANAEROBIC CULTURE W GRAM STAIN (SURGICAL/DEEP WOUND)

## 2020-05-02 LAB — CULTURE, BLOOD (ROUTINE X 2)
Culture: NO GROWTH
Culture: NO GROWTH
Special Requests: ADEQUATE

## 2020-05-05 DIAGNOSIS — Z452 Encounter for adjustment and management of vascular access device: Secondary | ICD-10-CM | POA: Diagnosis not present

## 2020-05-05 DIAGNOSIS — C9480 Other specified leukemias not having achieved remission: Secondary | ICD-10-CM | POA: Diagnosis not present

## 2020-05-05 DIAGNOSIS — F419 Anxiety disorder, unspecified: Secondary | ICD-10-CM | POA: Diagnosis not present

## 2020-05-05 DIAGNOSIS — B952 Enterococcus as the cause of diseases classified elsewhere: Secondary | ICD-10-CM | POA: Diagnosis not present

## 2020-05-05 DIAGNOSIS — F32A Depression, unspecified: Secondary | ICD-10-CM | POA: Diagnosis not present

## 2020-05-05 DIAGNOSIS — M069 Rheumatoid arthritis, unspecified: Secondary | ICD-10-CM | POA: Diagnosis not present

## 2020-05-05 DIAGNOSIS — M86152 Other acute osteomyelitis, left femur: Secondary | ICD-10-CM | POA: Diagnosis not present

## 2020-05-05 DIAGNOSIS — Z86718 Personal history of other venous thrombosis and embolism: Secondary | ICD-10-CM | POA: Diagnosis not present

## 2020-05-05 DIAGNOSIS — J9611 Chronic respiratory failure with hypoxia: Secondary | ICD-10-CM | POA: Diagnosis not present

## 2020-05-05 DIAGNOSIS — K5790 Diverticulosis of intestine, part unspecified, without perforation or abscess without bleeding: Secondary | ICD-10-CM | POA: Diagnosis not present

## 2020-05-05 DIAGNOSIS — I272 Pulmonary hypertension, unspecified: Secondary | ICD-10-CM | POA: Diagnosis not present

## 2020-05-05 DIAGNOSIS — G8929 Other chronic pain: Secondary | ICD-10-CM | POA: Diagnosis not present

## 2020-05-05 DIAGNOSIS — D689 Coagulation defect, unspecified: Secondary | ICD-10-CM | POA: Diagnosis not present

## 2020-05-05 DIAGNOSIS — T8452XD Infection and inflammatory reaction due to internal left hip prosthesis, subsequent encounter: Secondary | ICD-10-CM | POA: Diagnosis not present

## 2020-05-05 DIAGNOSIS — M109 Gout, unspecified: Secondary | ICD-10-CM | POA: Diagnosis not present

## 2020-05-05 DIAGNOSIS — Z9981 Dependence on supplemental oxygen: Secondary | ICD-10-CM | POA: Diagnosis not present

## 2020-05-05 DIAGNOSIS — M810 Age-related osteoporosis without current pathological fracture: Secondary | ICD-10-CM | POA: Diagnosis not present

## 2020-05-05 DIAGNOSIS — M797 Fibromyalgia: Secondary | ICD-10-CM | POA: Diagnosis not present

## 2020-05-05 DIAGNOSIS — I2510 Atherosclerotic heart disease of native coronary artery without angina pectoris: Secondary | ICD-10-CM | POA: Diagnosis not present

## 2020-05-05 DIAGNOSIS — B20 Human immunodeficiency virus [HIV] disease: Secondary | ICD-10-CM | POA: Diagnosis not present

## 2020-05-05 DIAGNOSIS — M549 Dorsalgia, unspecified: Secondary | ICD-10-CM | POA: Diagnosis not present

## 2020-05-05 DIAGNOSIS — I69392 Facial weakness following cerebral infarction: Secondary | ICD-10-CM | POA: Diagnosis not present

## 2020-05-05 DIAGNOSIS — I10 Essential (primary) hypertension: Secondary | ICD-10-CM | POA: Diagnosis not present

## 2020-05-05 DIAGNOSIS — K2100 Gastro-esophageal reflux disease with esophagitis, without bleeding: Secondary | ICD-10-CM | POA: Diagnosis not present

## 2020-05-05 DIAGNOSIS — J849 Interstitial pulmonary disease, unspecified: Secondary | ICD-10-CM | POA: Diagnosis not present

## 2020-05-08 DIAGNOSIS — T8452XA Infection and inflammatory reaction due to internal left hip prosthesis, initial encounter: Secondary | ICD-10-CM | POA: Diagnosis not present

## 2020-05-08 DIAGNOSIS — B952 Enterococcus as the cause of diseases classified elsewhere: Secondary | ICD-10-CM | POA: Diagnosis not present

## 2020-05-08 DIAGNOSIS — T8452XD Infection and inflammatory reaction due to internal left hip prosthesis, subsequent encounter: Secondary | ICD-10-CM | POA: Diagnosis not present

## 2020-05-08 DIAGNOSIS — B20 Human immunodeficiency virus [HIV] disease: Secondary | ICD-10-CM | POA: Diagnosis not present

## 2020-05-08 DIAGNOSIS — I10 Essential (primary) hypertension: Secondary | ICD-10-CM | POA: Diagnosis not present

## 2020-05-08 DIAGNOSIS — Z452 Encounter for adjustment and management of vascular access device: Secondary | ICD-10-CM | POA: Diagnosis not present

## 2020-05-08 DIAGNOSIS — M86152 Other acute osteomyelitis, left femur: Secondary | ICD-10-CM | POA: Diagnosis not present

## 2020-05-11 ENCOUNTER — Other Ambulatory Visit: Payer: Self-pay | Admitting: Cardiology

## 2020-05-13 ENCOUNTER — Other Ambulatory Visit: Payer: Self-pay | Admitting: Internal Medicine

## 2020-05-13 DIAGNOSIS — E876 Hypokalemia: Secondary | ICD-10-CM

## 2020-05-14 ENCOUNTER — Other Ambulatory Visit: Payer: Self-pay | Admitting: Internal Medicine

## 2020-05-14 DIAGNOSIS — I82403 Acute embolism and thrombosis of unspecified deep veins of lower extremity, bilateral: Secondary | ICD-10-CM

## 2020-05-15 DIAGNOSIS — Z452 Encounter for adjustment and management of vascular access device: Secondary | ICD-10-CM | POA: Diagnosis not present

## 2020-05-15 DIAGNOSIS — B952 Enterococcus as the cause of diseases classified elsewhere: Secondary | ICD-10-CM | POA: Diagnosis not present

## 2020-05-15 DIAGNOSIS — M86152 Other acute osteomyelitis, left femur: Secondary | ICD-10-CM | POA: Diagnosis not present

## 2020-05-15 DIAGNOSIS — I10 Essential (primary) hypertension: Secondary | ICD-10-CM | POA: Diagnosis not present

## 2020-05-15 DIAGNOSIS — B20 Human immunodeficiency virus [HIV] disease: Secondary | ICD-10-CM | POA: Diagnosis not present

## 2020-05-15 DIAGNOSIS — T8452XD Infection and inflammatory reaction due to internal left hip prosthesis, subsequent encounter: Secondary | ICD-10-CM | POA: Diagnosis not present

## 2020-05-23 ENCOUNTER — Telehealth: Payer: Self-pay | Admitting: Internal Medicine

## 2020-05-23 ENCOUNTER — Encounter: Payer: Self-pay | Admitting: Internal Medicine

## 2020-05-23 DIAGNOSIS — Z452 Encounter for adjustment and management of vascular access device: Secondary | ICD-10-CM | POA: Diagnosis not present

## 2020-05-23 DIAGNOSIS — B20 Human immunodeficiency virus [HIV] disease: Secondary | ICD-10-CM | POA: Diagnosis not present

## 2020-05-23 DIAGNOSIS — I10 Essential (primary) hypertension: Secondary | ICD-10-CM | POA: Diagnosis not present

## 2020-05-23 DIAGNOSIS — T8452XD Infection and inflammatory reaction due to internal left hip prosthesis, subsequent encounter: Secondary | ICD-10-CM | POA: Diagnosis not present

## 2020-05-23 DIAGNOSIS — M86152 Other acute osteomyelitis, left femur: Secondary | ICD-10-CM | POA: Diagnosis not present

## 2020-05-23 DIAGNOSIS — B952 Enterococcus as the cause of diseases classified elsewhere: Secondary | ICD-10-CM | POA: Diagnosis not present

## 2020-05-23 NOTE — Telephone Encounter (Signed)
   Aldona Bar a Therapist, sports from [employer] calling, states they get weekly labs on the patient and its usually on tuesdays but they had to collect it today. Also wanted to make [place] aware that the patient was exposed to covid and is having covid symptoms so he is going to get tested today

## 2020-05-23 NOTE — Telephone Encounter (Signed)
Noted  

## 2020-05-24 ENCOUNTER — Encounter: Payer: Self-pay | Admitting: Internal Medicine

## 2020-05-24 ENCOUNTER — Other Ambulatory Visit: Payer: Self-pay | Admitting: Internal Medicine

## 2020-05-24 DIAGNOSIS — J40 Bronchitis, not specified as acute or chronic: Secondary | ICD-10-CM | POA: Insufficient documentation

## 2020-05-24 DIAGNOSIS — D849 Immunodeficiency, unspecified: Secondary | ICD-10-CM

## 2020-05-24 DIAGNOSIS — U071 COVID-19: Secondary | ICD-10-CM

## 2020-05-24 MED ORDER — DEXAMETHASONE 6 MG PO TABS
6.0000 mg | ORAL_TABLET | Freq: Every day | ORAL | 0 refills | Status: AC
Start: 2020-05-24 — End: 2020-05-29

## 2020-05-24 MED ORDER — ONDANSETRON HCL 4 MG PO TABS: 4.0000 mg | ORAL_TABLET | Freq: Four times a day (QID) | ORAL | 1 refills | Status: AC | PRN

## 2020-05-24 NOTE — Telephone Encounter (Signed)
Dr. Ramaswamy, Please see patient comment and advise.  Thank you. 

## 2020-05-25 ENCOUNTER — Telehealth: Payer: Self-pay | Admitting: Nurse Practitioner

## 2020-05-25 DIAGNOSIS — Z7982 Long term (current) use of aspirin: Secondary | ICD-10-CM | POA: Diagnosis not present

## 2020-05-25 DIAGNOSIS — I10 Essential (primary) hypertension: Secondary | ICD-10-CM | POA: Diagnosis present

## 2020-05-25 DIAGNOSIS — Z21 Asymptomatic human immunodeficiency virus [HIV] infection status: Secondary | ICD-10-CM | POA: Diagnosis not present

## 2020-05-25 DIAGNOSIS — Z882 Allergy status to sulfonamides status: Secondary | ICD-10-CM | POA: Diagnosis not present

## 2020-05-25 DIAGNOSIS — R059 Cough, unspecified: Secondary | ICD-10-CM | POA: Diagnosis not present

## 2020-05-25 DIAGNOSIS — D649 Anemia, unspecified: Secondary | ICD-10-CM | POA: Diagnosis not present

## 2020-05-25 DIAGNOSIS — I252 Old myocardial infarction: Secondary | ICD-10-CM | POA: Diagnosis not present

## 2020-05-25 DIAGNOSIS — D72829 Elevated white blood cell count, unspecified: Secondary | ICD-10-CM | POA: Diagnosis not present

## 2020-05-25 DIAGNOSIS — R0602 Shortness of breath: Secondary | ICD-10-CM | POA: Diagnosis not present

## 2020-05-25 DIAGNOSIS — Z8673 Personal history of transient ischemic attack (TIA), and cerebral infarction without residual deficits: Secondary | ICD-10-CM | POA: Diagnosis not present

## 2020-05-25 DIAGNOSIS — R069 Unspecified abnormalities of breathing: Secondary | ICD-10-CM | POA: Diagnosis not present

## 2020-05-25 DIAGNOSIS — J80 Acute respiratory distress syndrome: Secondary | ICD-10-CM | POA: Diagnosis not present

## 2020-05-25 DIAGNOSIS — J9601 Acute respiratory failure with hypoxia: Secondary | ICD-10-CM | POA: Diagnosis not present

## 2020-05-25 DIAGNOSIS — I4891 Unspecified atrial fibrillation: Secondary | ICD-10-CM | POA: Diagnosis not present

## 2020-05-25 DIAGNOSIS — U071 COVID-19: Secondary | ICD-10-CM | POA: Diagnosis not present

## 2020-05-25 DIAGNOSIS — D696 Thrombocytopenia, unspecified: Secondary | ICD-10-CM | POA: Diagnosis not present

## 2020-05-25 DIAGNOSIS — J8410 Pulmonary fibrosis, unspecified: Secondary | ICD-10-CM | POA: Diagnosis present

## 2020-05-25 DIAGNOSIS — J1282 Pneumonia due to coronavirus disease 2019: Secondary | ICD-10-CM | POA: Diagnosis not present

## 2020-05-25 DIAGNOSIS — I517 Cardiomegaly: Secondary | ICD-10-CM | POA: Diagnosis not present

## 2020-05-25 DIAGNOSIS — M1990 Unspecified osteoarthritis, unspecified site: Secondary | ICD-10-CM | POA: Diagnosis present

## 2020-05-25 DIAGNOSIS — R52 Pain, unspecified: Secondary | ICD-10-CM | POA: Diagnosis not present

## 2020-05-25 DIAGNOSIS — R001 Bradycardia, unspecified: Secondary | ICD-10-CM | POA: Diagnosis not present

## 2020-05-25 DIAGNOSIS — J189 Pneumonia, unspecified organism: Secondary | ICD-10-CM | POA: Diagnosis not present

## 2020-05-25 DIAGNOSIS — E876 Hypokalemia: Secondary | ICD-10-CM | POA: Diagnosis not present

## 2020-05-25 DIAGNOSIS — D509 Iron deficiency anemia, unspecified: Secondary | ICD-10-CM | POA: Diagnosis present

## 2020-05-25 DIAGNOSIS — I2510 Atherosclerotic heart disease of native coronary artery without angina pectoris: Secondary | ICD-10-CM | POA: Diagnosis present

## 2020-05-25 DIAGNOSIS — J90 Pleural effusion, not elsewhere classified: Secondary | ICD-10-CM | POA: Diagnosis not present

## 2020-05-25 DIAGNOSIS — R0902 Hypoxemia: Secondary | ICD-10-CM | POA: Diagnosis not present

## 2020-05-25 DIAGNOSIS — Z86718 Personal history of other venous thrombosis and embolism: Secondary | ICD-10-CM | POA: Diagnosis not present

## 2020-05-25 DIAGNOSIS — C9110 Chronic lymphocytic leukemia of B-cell type not having achieved remission: Secondary | ICD-10-CM | POA: Diagnosis present

## 2020-05-25 DIAGNOSIS — R29898 Other symptoms and signs involving the musculoskeletal system: Secondary | ICD-10-CM | POA: Diagnosis not present

## 2020-05-25 DIAGNOSIS — R918 Other nonspecific abnormal finding of lung field: Secondary | ICD-10-CM | POA: Diagnosis not present

## 2020-05-25 DIAGNOSIS — Z79899 Other long term (current) drug therapy: Secondary | ICD-10-CM | POA: Diagnosis not present

## 2020-05-25 DIAGNOSIS — Z66 Do not resuscitate: Secondary | ICD-10-CM | POA: Diagnosis not present

## 2020-05-25 DIAGNOSIS — R000 Tachycardia, unspecified: Secondary | ICD-10-CM | POA: Diagnosis not present

## 2020-05-25 DIAGNOSIS — Z880 Allergy status to penicillin: Secondary | ICD-10-CM | POA: Diagnosis not present

## 2020-05-25 DIAGNOSIS — Z515 Encounter for palliative care: Secondary | ICD-10-CM | POA: Diagnosis not present

## 2020-05-25 NOTE — Telephone Encounter (Signed)
[place] [facility] called pt per request from pulmonolgist . LVM for return call to schedule appt next week. Respiratory Clinic closed tonight. Pt will need to go to ED if oxygen level continues to drop. Will schedule first available next week upon return call from pt.

## 2020-05-25 NOTE — Telephone Encounter (Signed)
Referral has been placed, and patient has been advised to present to ED if his oxygen saturations worsen. Thanks!

## 2020-05-25 NOTE — Telephone Encounter (Signed)
Hey. It looks like this patient needs to be placed on resp schedule this afternoon. He needs in person eval. Thanks. If sats are continuing to drop he needs ED.

## 2020-05-25 NOTE — Telephone Encounter (Signed)
Please place order for referral - [id number]. Thanks I will notify the team.

## 2020-05-25 NOTE — Telephone Encounter (Signed)
He is super immuen suppressed with HIV and RA immune supression medication. He might NOT have made antibody to the vaccine.   Plan  - he needs STAT referral to infusion center - copying [facility] thought he might be out of window in terms of syhmpotoms   - please find out date of onset of symoptoms in reply all to include Vanita Panda   - and please do separate referral/tell patent workflow for the infusion clinic

## 2020-05-26 ENCOUNTER — Telehealth: Payer: Self-pay | Admitting: Adult Health

## 2020-05-26 NOTE — Telephone Encounter (Signed)
Called to discuss with patient about COVID-19 symptoms and the use of one of the available treatments for those with mild to moderate Covid symptoms and at a high risk of hospitalization.  Pt appears to qualify for outpatient treatment due to co-morbid conditions and/or a member of an at-risk group in accordance with the FDA Emergency Use Authorization.     Unable to reach pt - LMOM to call us back   Scot Dock

## 2020-05-28 ENCOUNTER — Telehealth: Payer: Self-pay | Admitting: *Deleted

## 2020-05-28 ENCOUNTER — Inpatient Hospital Stay: Payer: Medicare Other | Admitting: Internal Medicine

## 2020-05-28 NOTE — Telephone Encounter (Signed)
Received message from patient stating that he was in the ICU at [facility]). Will forward to MR as a FYI.

## 2020-05-28 NOTE — Telephone Encounter (Signed)
Contacted patient as follow up to Telephone Advice fax from after hours [facility] 1/15, Friday evening. Patient left message regarding a call about an appointment. Patient is currently in [name] in [place] receiving treatment for Covid. Advised him that Dr. Alen Blew would be informed. Message routed

## 2020-05-29 ENCOUNTER — Telehealth: Payer: Self-pay | Admitting: *Deleted

## 2020-05-29 NOTE — Telephone Encounter (Signed)
Received call from his daughter that patient is hospitalized with Covid at [facility] in [place], in the ICU and could potentially be intubated soon. RN thanked her for calling, relayed our best wishes for his recovery, and will relay this to Dr [name]. Landis Gandy, RN

## 2020-05-30 ENCOUNTER — Ambulatory Visit: Payer: Medicare Other | Admitting: Internal Medicine

## 2020-05-31 ENCOUNTER — Telehealth: Payer: Self-pay | Admitting: Internal Medicine

## 2020-05-31 ENCOUNTER — Telehealth: Payer: Self-pay | Admitting: Orthopaedic Surgery

## 2020-05-31 ENCOUNTER — Telehealth: Payer: Self-pay

## 2020-05-31 NOTE — Telephone Encounter (Signed)
Patients daughter calling to make [place] aware that the patient passed away yesterday 06/25/2020

## 2020-05-31 NOTE — Telephone Encounter (Signed)
Thank you :)

## 2020-05-31 NOTE — Telephone Encounter (Signed)
Pt chart has been updated with dod.

## 2020-05-31 NOTE — Telephone Encounter (Signed)
Patient's daughter [name] called to let Dr. Ninfa Linden know her dad passed away last night. [name] said her Dad had Covid-19 and Pneumonia. The number to contact [name] is [phone]

## 2020-05-31 NOTE — Telephone Encounter (Signed)
Spoke with the pt's daughter, [name]  She is calling to inform us that pt passed away at the hospital due to covid pna 2020-06-19  I gave my condolences and advised will inform Dr Chase Caller She also wanted pt's cardiologist to be informed but could not recall his name  Looks like this was a pt of Dr Percival Spanish  Will forward to his as well as Juluis Rainier

## 2020-05-31 NOTE — Telephone Encounter (Signed)
Patient's daughter called to notify the office that the patient passed away last night. Will notify provider and [facility].   Beryle Flock, RN

## 2020-06-01 NOTE — Telephone Encounter (Signed)
Condolence card has been obtained. Will place in MR's papers for him to sign when he returns back to office.

## 2020-06-01 NOTE — Telephone Encounter (Signed)
Sad. Nice man. Pls get condolence card

## 2020-06-06 ENCOUNTER — Ambulatory Visit: Payer: Medicare Other | Admitting: Urology

## 2020-06-06 NOTE — Telephone Encounter (Signed)
He passed away. Nice guy. Please get condolence card

## 2020-06-06 NOTE — Telephone Encounter (Signed)
Condolence card has been obtained and is MR's papers for him to sign. Nothing further needed.

## 2020-06-12 NOTE — Telephone Encounter (Signed)
[name] called to provide an update on her dad. He told nursing last night he hurts, is tired, wants to go. He is being offered intubation at this point, but refused. Hospital staff found DNR in living will; he has put this into effect, has documented his wishes for no intubation.  [name] went to visit him today, said he is "out of it" and does not think he will survive this hospitalization. She reports that he was not squeezing her hand, did not recognize her, could see under the oxygen face mask that he had his eyes rolled back into his head. [name] does have support as she navigates this, will continue to keep [place] updated.

## 2020-06-12 NOTE — Telephone Encounter (Signed)
thanks

## 2020-06-12 DEATH — deceased

## 2020-07-04 ENCOUNTER — Ambulatory Visit: Payer: Medicare Other | Admitting: Orthopaedic Surgery

## 2020-07-10 ENCOUNTER — Ambulatory Visit: Payer: Medicare Other | Admitting: Internal Medicine

## 2020-07-20 ENCOUNTER — Ambulatory Visit: Payer: Medicare Other | Admitting: Infectious Diseases

## 2020-09-04 IMAGING — RF DG C-ARM 61-120 MIN
1 series · 1 of 1 positions shown · non-contrast
Comparison: 7679 hours earlier today.

CLINICAL DATA: 68-year-old male undergoing closed reduction of
prosthetic left hip dislocation.

EXAM:
DG HIP (WITH OR WITHOUT PELVIS) 1V*L*; DG C-ARM 61-120 MIN

[Series 1: run · 1 of 1 slices shown]
[im 1/1]
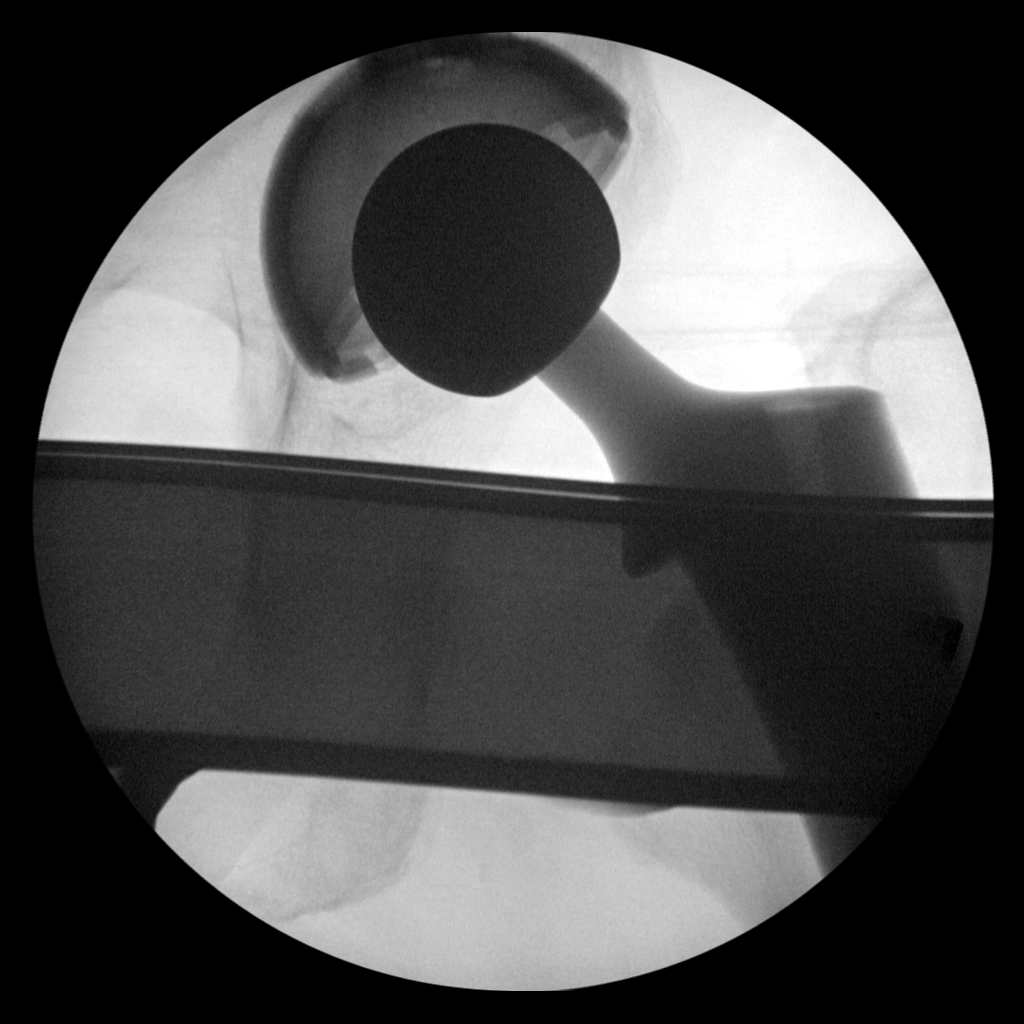

[1 of 1 positions shown; findings below may reference images not displayed]

Intraoperative hip
arthroplasty images 08/17/18.

FLUOROSCOPY TIME:  0 minutes 12 seconds.
FINDINGS: Single AP fluoroscopic spot view at 6849 hours demonstrates normal
AP alignment of the total left hip arthroplasty components.
IMPRESSION: Left hip dislocation appears reduced on a single AP fluoroscopic
spot image.

## 2020-09-06 IMAGING — DX PORTABLE CHEST - 1 VIEW
1 series · 1 of 1 positions shown · non-contrast
Comparison: 08/28/2018

CLINICAL DATA: Cough and wheezing

EXAM:
PORTABLE CHEST 1 VIEW

[chest ap]
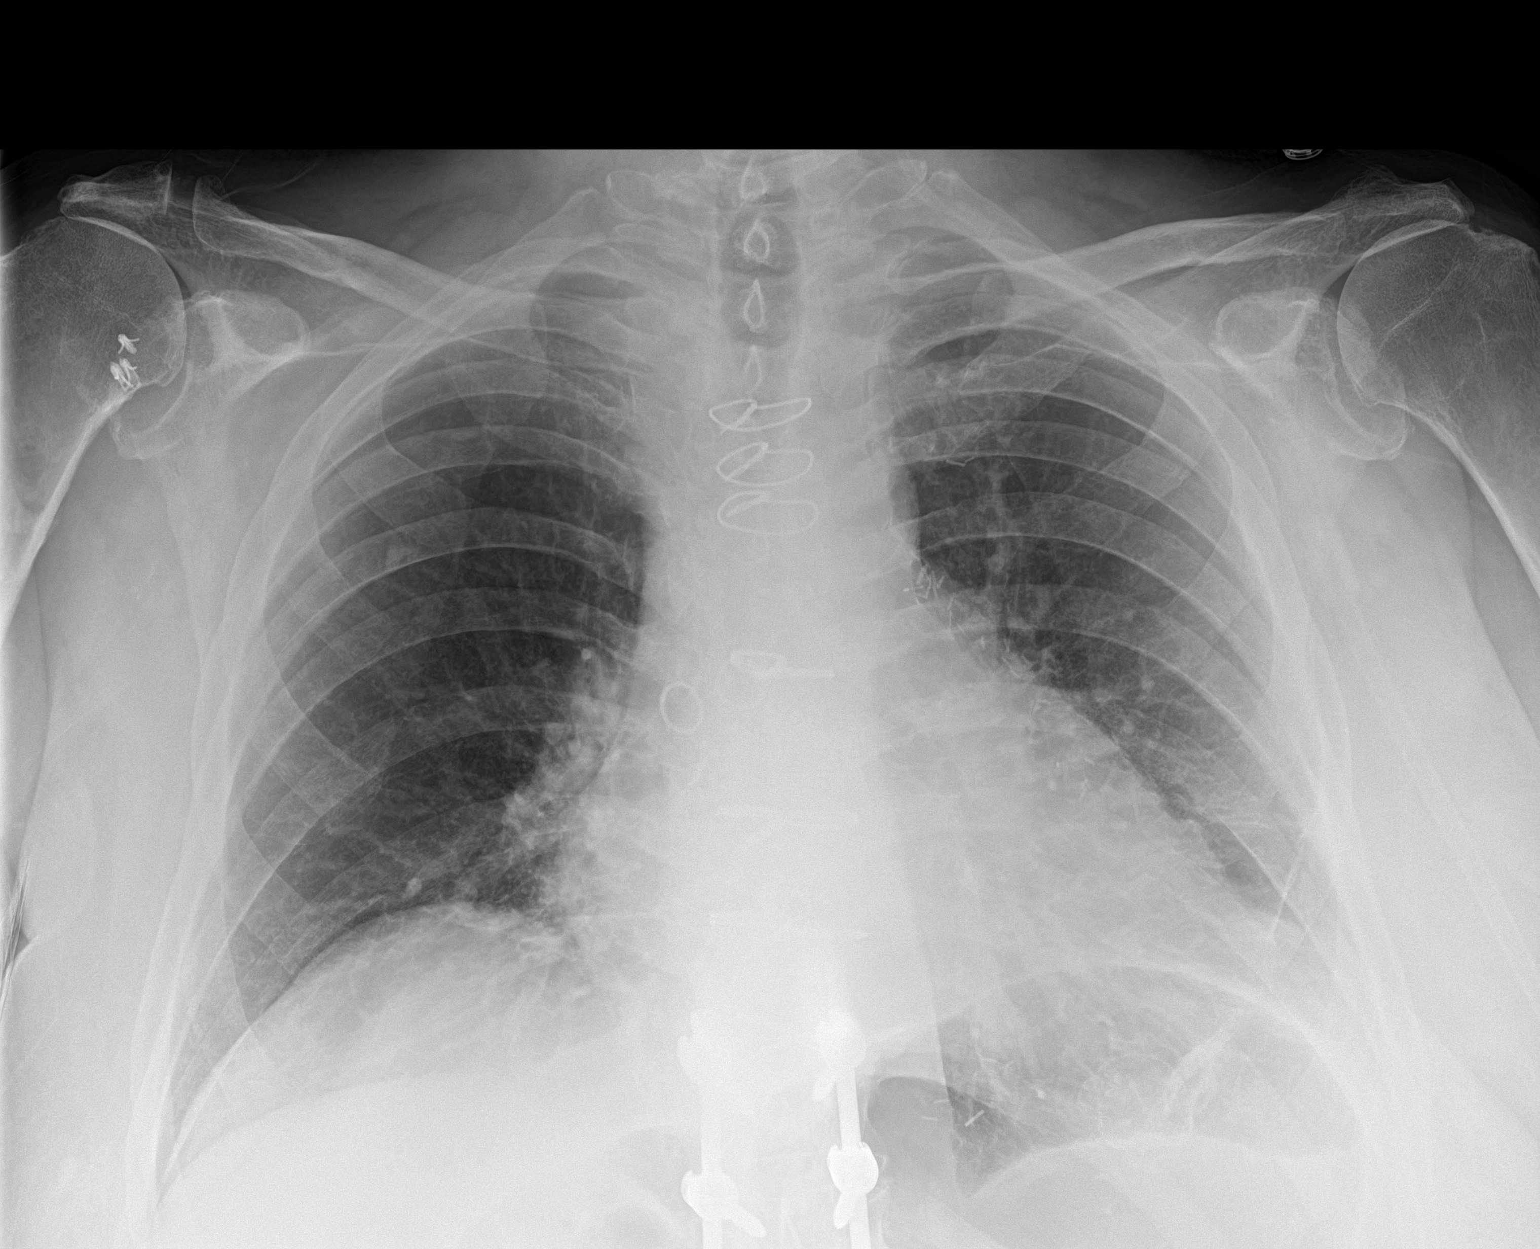

[1 of 1 positions shown; findings below may reference images not displayed]

FINDINGS: Normal heart size. Lungs are under aerated with bibasilar
atelectasis. No pneumothorax or pleural effusion.
IMPRESSION: Bibasilar atelectasis.

## 2020-10-17 ENCOUNTER — Ambulatory Visit: Payer: Medicare Other | Admitting: Oncology

## 2020-10-17 ENCOUNTER — Other Ambulatory Visit: Payer: Medicare Other

## 2020-11-07 IMAGING — CR DG HIP (WITH OR WITHOUT PELVIS) 1V PORT LEFT
3 series · 3 of 3 positions shown · non-contrast
Comparison: Radiograph earlier this day at 2328 hour

CLINICAL DATA: Left hip reduction.

EXAM:
DG HIP (WITH OR WITHOUT PELVIS) 1V PORT LEFT

[AP]
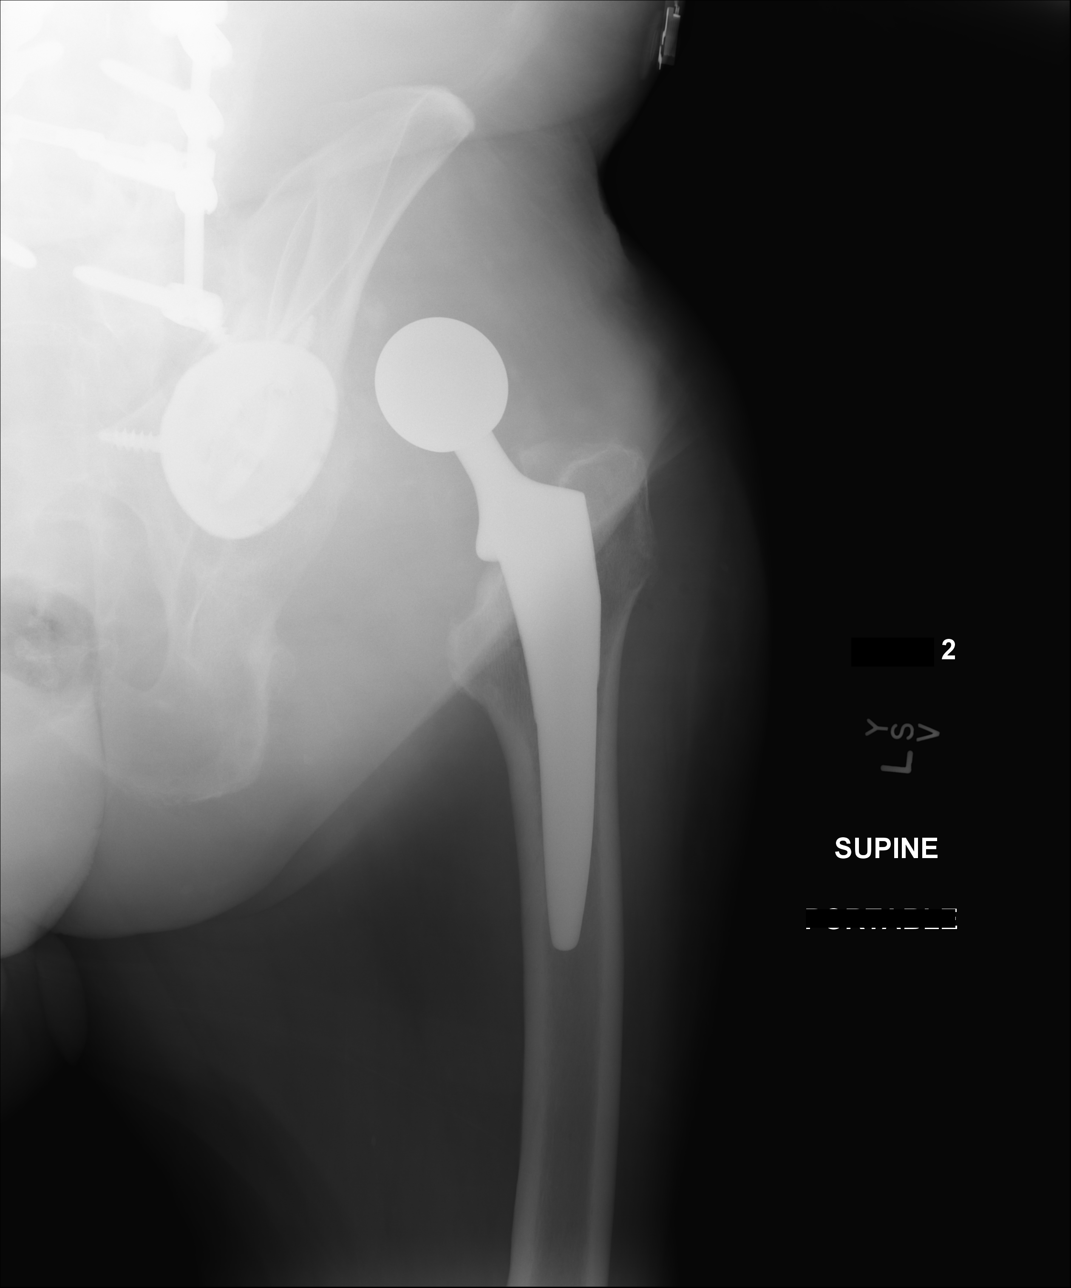

[lateral (1 of 2)]
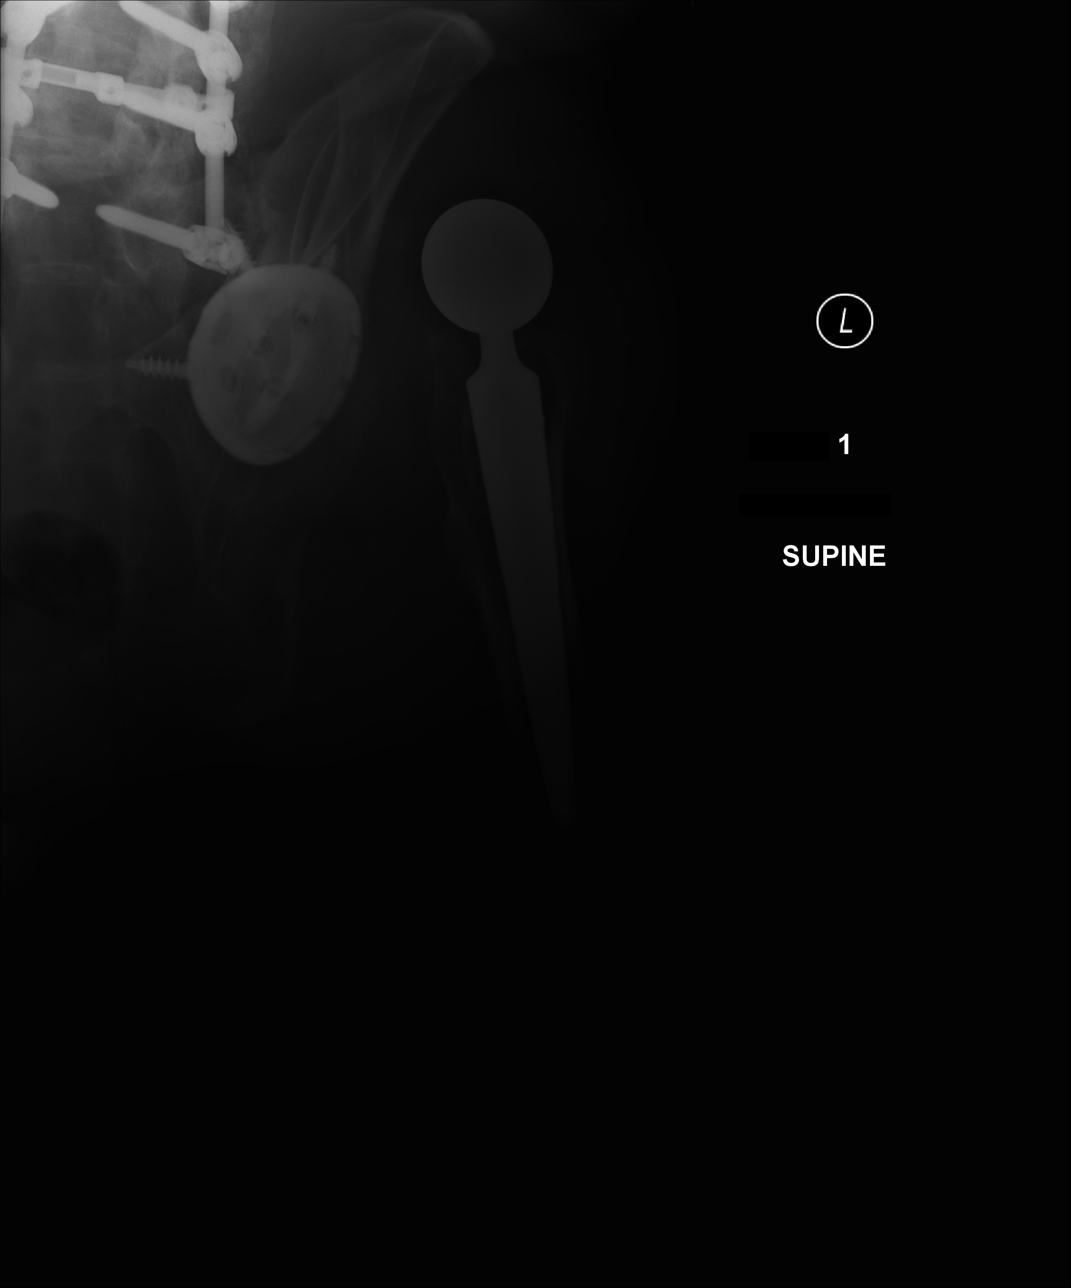

[lateral (2 of 2)]
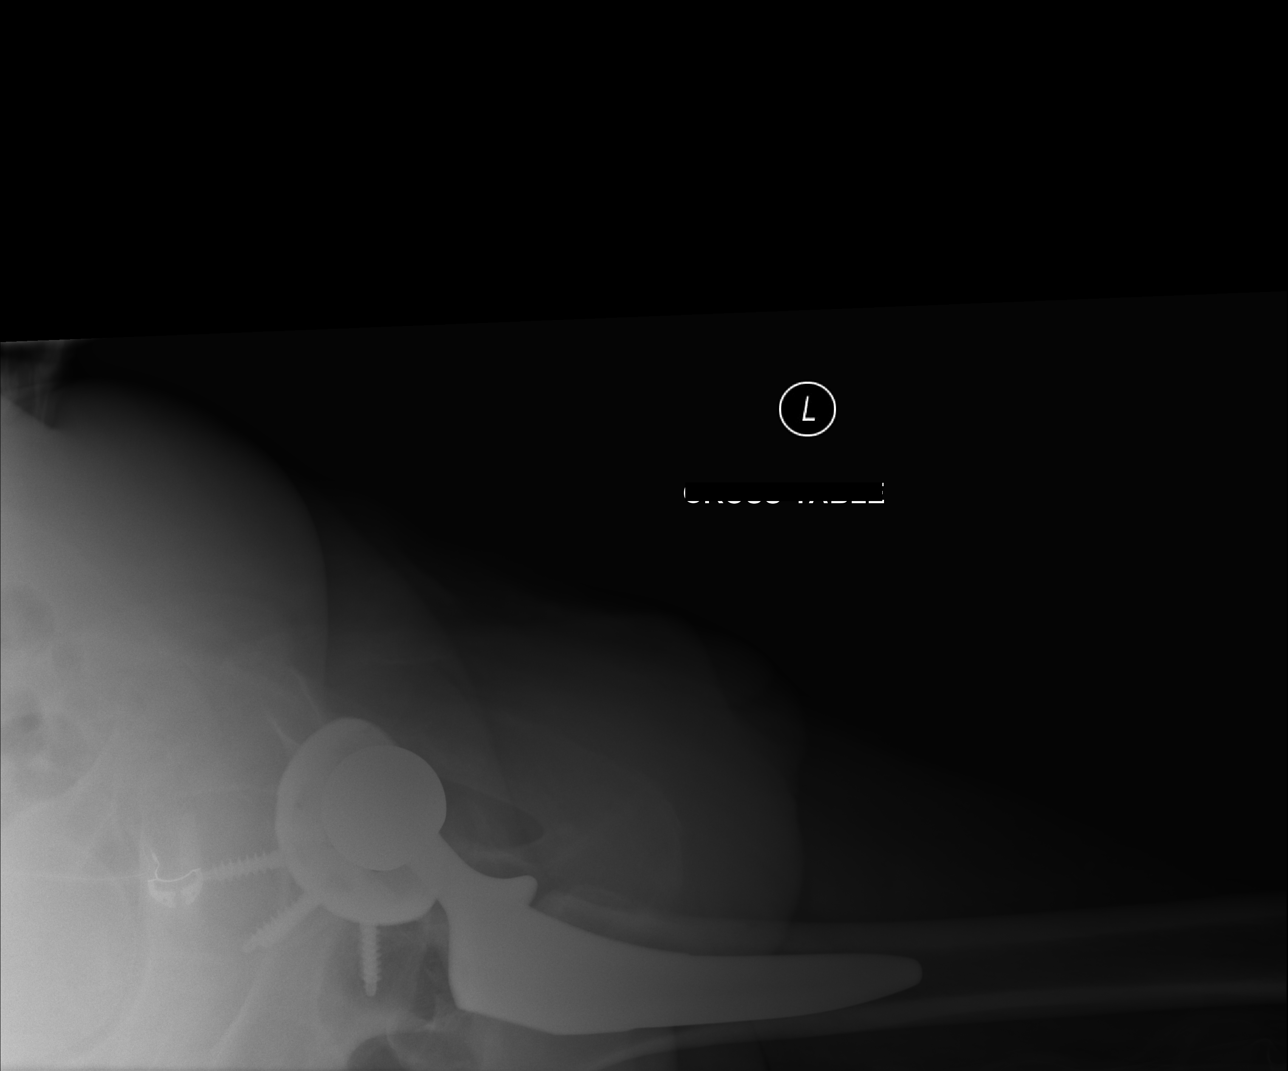

[3 of 3 positions shown; findings below may reference images not displayed]

FINDINGS: Image number 1 and 2 demonstrates persistent dislocation of femoral
component of left hip arthroplasty. Cross-table lateral demonstrates
the femoral component projecting over the acetabular cup, however
this may be due to superimposition.
IMPRESSION: Persistent dislocation of femoral component of left hip
arthroplasty.

## 2020-11-09 IMAGING — DX PORTABLE PELVIS 1-2 VIEWS
1 series · 1 of 1 positions shown · non-contrast
Comparison: Fluoroscopic images of same day.

CLINICAL DATA: Status post left total hip replacement.

EXAM:
PORTABLE PELVIS 1-2 VIEWS

[pelvis ap]
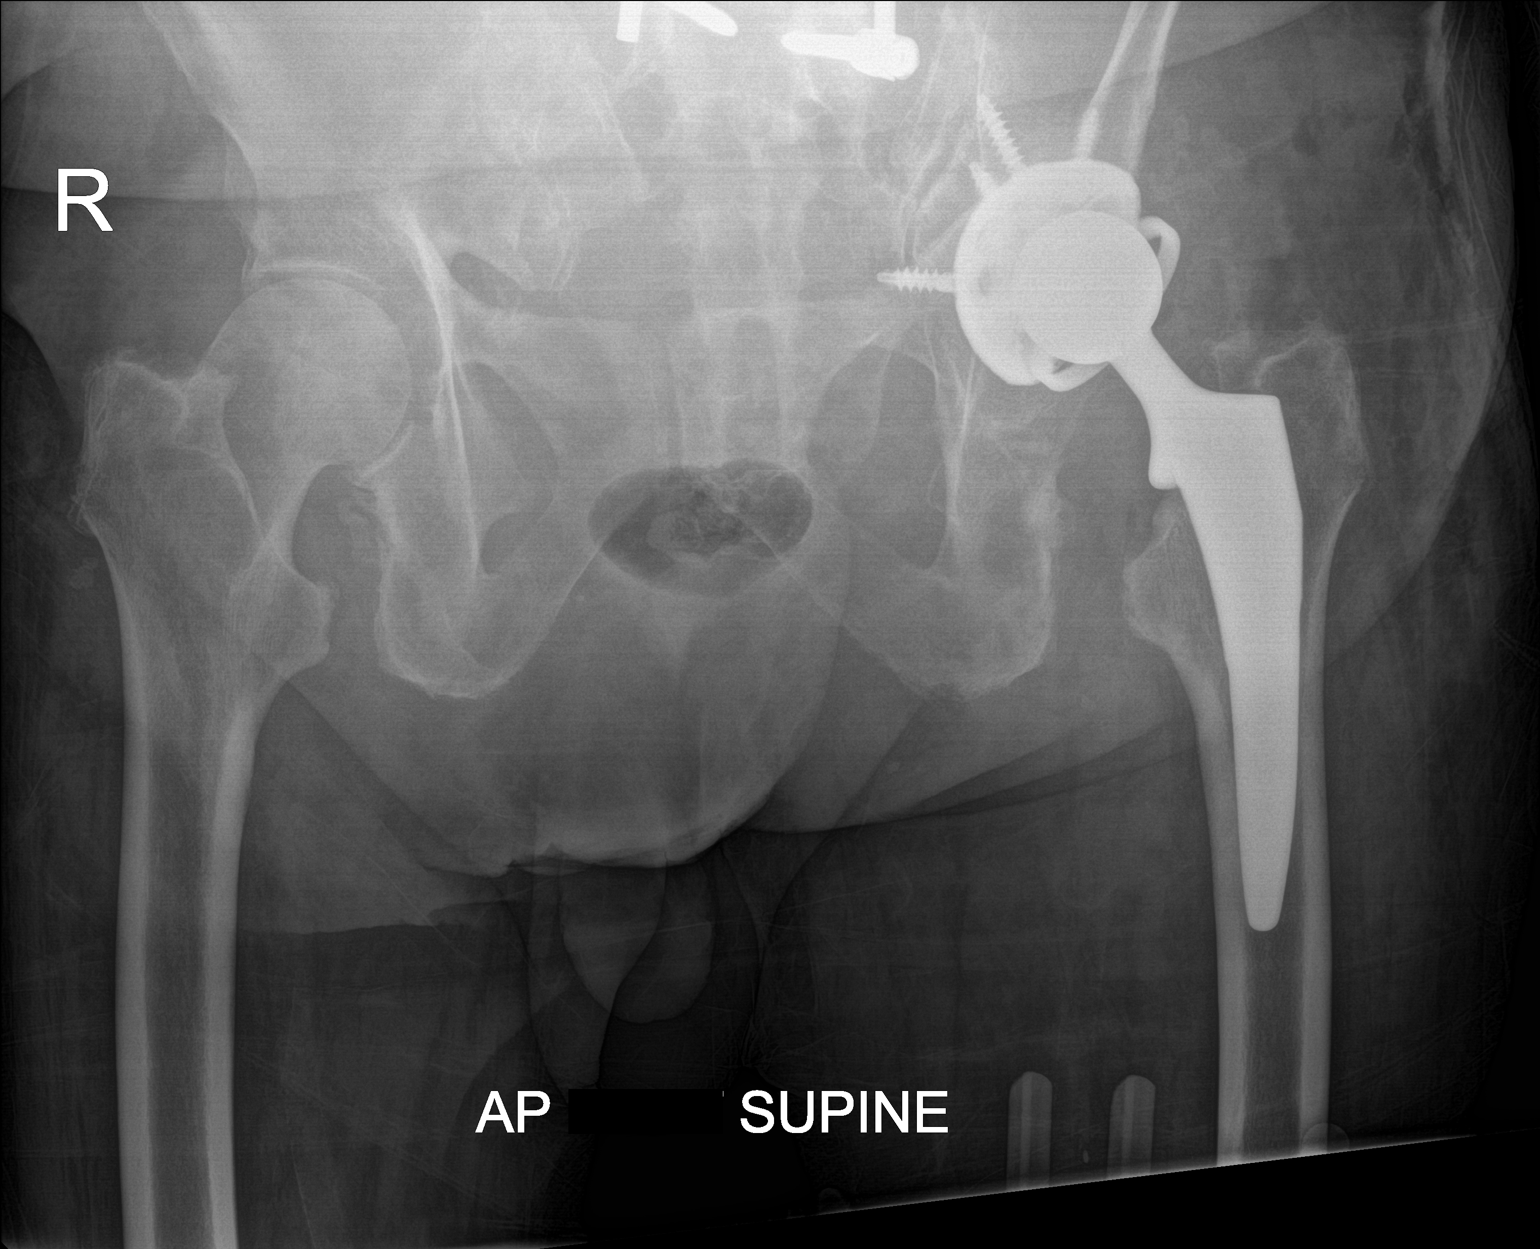

[1 of 1 positions shown; findings below may reference images not displayed]

FINDINGS: The left acetabular and femoral components appear to be well
situated status post revision of left hip arthroplasty. Expected
postoperative changes are seen in the surrounding soft tissues. No
fracture or dislocation is noted.
IMPRESSION: Status post revision of left total hip arthroplasty.
